# Patient Record
Sex: Female | Born: 1965
Health system: Southern US, Community
[De-identification: ages and names within clinical notes are randomized; demographics above are authoritative.]

## PROBLEM LIST (undated history)

## (undated) VITALS — BP 134/94 | HR 95 | Temp 98.4°F | Resp 20 | Ht 64.0 in | Wt 185.0 lb

## (undated) DIAGNOSIS — E119 Type 2 diabetes mellitus without complications: Secondary | ICD-10-CM

## (undated) DIAGNOSIS — K219 Gastro-esophageal reflux disease without esophagitis: Secondary | ICD-10-CM

## (undated) DIAGNOSIS — I5189 Other ill-defined heart diseases: Secondary | ICD-10-CM

## (undated) DIAGNOSIS — H539 Unspecified visual disturbance: Secondary | ICD-10-CM

## (undated) DIAGNOSIS — F1010 Alcohol abuse, uncomplicated: Secondary | ICD-10-CM

## (undated) DIAGNOSIS — F4310 Post-traumatic stress disorder, unspecified: Secondary | ICD-10-CM

## (undated) DIAGNOSIS — E669 Obesity, unspecified: Secondary | ICD-10-CM

## (undated) DIAGNOSIS — E785 Hyperlipidemia, unspecified: Secondary | ICD-10-CM

## (undated) DIAGNOSIS — R197 Diarrhea, unspecified: Secondary | ICD-10-CM

## (undated) DIAGNOSIS — F32A Depression, unspecified: Secondary | ICD-10-CM

## (undated) DIAGNOSIS — J189 Pneumonia, unspecified organism: Secondary | ICD-10-CM

## (undated) DIAGNOSIS — B079 Viral wart, unspecified: Secondary | ICD-10-CM

## (undated) DIAGNOSIS — D509 Iron deficiency anemia, unspecified: Secondary | ICD-10-CM

## (undated) DIAGNOSIS — J069 Acute upper respiratory infection, unspecified: Secondary | ICD-10-CM

## (undated) DIAGNOSIS — F329 Major depressive disorder, single episode, unspecified: Secondary | ICD-10-CM

## (undated) DIAGNOSIS — I2510 Atherosclerotic heart disease of native coronary artery without angina pectoris: Secondary | ICD-10-CM

## (undated) DIAGNOSIS — F1011 Alcohol abuse, in remission: Secondary | ICD-10-CM

## (undated) DIAGNOSIS — B370 Candidal stomatitis: Secondary | ICD-10-CM

## (undated) DIAGNOSIS — I10 Essential (primary) hypertension: Secondary | ICD-10-CM

## (undated) DIAGNOSIS — E876 Hypokalemia: Secondary | ICD-10-CM

## (undated) DIAGNOSIS — I272 Pulmonary hypertension, unspecified: Secondary | ICD-10-CM

## (undated) DIAGNOSIS — M25562 Pain in left knee: Secondary | ICD-10-CM

## (undated) HISTORY — DX: Atherosclerotic heart disease of native coronary artery without angina pectoris: I25.10

## (undated) HISTORY — DX: Hyperlipidemia, unspecified: E78.5

## (undated) HISTORY — DX: Alcohol abuse, in remission: F10.11

## (undated) HISTORY — DX: Acute upper respiratory infection, unspecified: J06.9

## (undated) HISTORY — DX: Pulmonary hypertension, unspecified: I27.20

## (undated) HISTORY — DX: Gastro-esophageal reflux disease without esophagitis: K21.9

## (undated) HISTORY — DX: Unspecified visual disturbance: H53.9

## (undated) HISTORY — DX: Other ill-defined heart diseases: I51.89

## (undated) HISTORY — DX: Pneumonia, unspecified organism: J18.9

## (undated) HISTORY — DX: Alcohol abuse, uncomplicated: F10.10

## (undated) HISTORY — DX: Depression, unspecified: F32.A

## (undated) HISTORY — DX: Candidal stomatitis: B37.0

## (undated) HISTORY — DX: Obesity, unspecified: E66.9

## (undated) HISTORY — DX: Viral wart, unspecified: B07.9

## (undated) HISTORY — DX: Diarrhea, unspecified: R19.7

## (undated) HISTORY — DX: Iron deficiency anemia, unspecified: D50.9

## (undated) HISTORY — DX: Major depressive disorder, single episode, unspecified: F32.9

## (undated) HISTORY — DX: Essential (primary) hypertension: I10

## (undated) HISTORY — DX: Hypokalemia: E87.6

## (undated) HISTORY — DX: Pain in left knee: M25.562

## (undated) HISTORY — DX: Type 2 diabetes mellitus without complications: E11.9

---

## 1990-04-16 HISTORY — PX: DILATION AND CURETTAGE, DIAGNOSTIC / THERAPEUTIC: SUR384

## 1998-04-16 DIAGNOSIS — E119 Type 2 diabetes mellitus without complications: Secondary | ICD-10-CM

## 1998-04-16 HISTORY — DX: Type 2 diabetes mellitus without complications: E11.9

## 1999-03-01 ENCOUNTER — Inpatient Hospital Stay (HOSPITAL_COMMUNITY): Admission: AD | Admit: 1999-03-01 | Discharge: 1999-03-03 | Payer: Self-pay | Admitting: *Deleted

## 1999-07-05 ENCOUNTER — Inpatient Hospital Stay (HOSPITAL_COMMUNITY): Admission: AD | Admit: 1999-07-05 | Discharge: 1999-07-12 | Payer: Self-pay | Admitting: *Deleted

## 2001-07-08 ENCOUNTER — Emergency Department (HOSPITAL_COMMUNITY): Admission: EM | Admit: 2001-07-08 | Discharge: 2001-07-08 | Payer: Self-pay | Admitting: Emergency Medicine

## 2001-09-21 ENCOUNTER — Emergency Department (HOSPITAL_COMMUNITY): Admission: EM | Admit: 2001-09-21 | Discharge: 2001-09-21 | Payer: Self-pay | Admitting: *Deleted

## 2001-09-30 ENCOUNTER — Ambulatory Visit (HOSPITAL_COMMUNITY): Admission: RE | Admit: 2001-09-30 | Discharge: 2001-09-30 | Payer: Self-pay | Admitting: Unknown Physician Specialty

## 2002-01-20 ENCOUNTER — Emergency Department (HOSPITAL_COMMUNITY): Admission: EM | Admit: 2002-01-20 | Discharge: 2002-01-20 | Payer: Self-pay | Admitting: *Deleted

## 2002-03-12 ENCOUNTER — Emergency Department (HOSPITAL_COMMUNITY): Admission: EM | Admit: 2002-03-12 | Discharge: 2002-03-12 | Payer: Self-pay | Admitting: Emergency Medicine

## 2002-08-17 ENCOUNTER — Emergency Department (HOSPITAL_COMMUNITY): Admission: EM | Admit: 2002-08-17 | Discharge: 2002-08-17 | Payer: Self-pay | Admitting: *Deleted

## 2002-09-28 DIAGNOSIS — E119 Type 2 diabetes mellitus without complications: Secondary | ICD-10-CM

## 2003-07-28 ENCOUNTER — Emergency Department (HOSPITAL_COMMUNITY): Admission: EM | Admit: 2003-07-28 | Discharge: 2003-07-28 | Payer: Self-pay

## 2003-08-16 ENCOUNTER — Emergency Department (HOSPITAL_COMMUNITY): Admission: EM | Admit: 2003-08-16 | Discharge: 2003-08-16 | Payer: Self-pay | Admitting: Emergency Medicine

## 2003-08-28 ENCOUNTER — Emergency Department (HOSPITAL_COMMUNITY): Admission: EM | Admit: 2003-08-28 | Discharge: 2003-08-28 | Payer: Self-pay | Admitting: *Deleted

## 2003-08-29 ENCOUNTER — Emergency Department (HOSPITAL_COMMUNITY): Admission: EM | Admit: 2003-08-29 | Discharge: 2003-08-29 | Payer: Self-pay | Admitting: Emergency Medicine

## 2003-09-04 ENCOUNTER — Emergency Department (HOSPITAL_COMMUNITY): Admission: EM | Admit: 2003-09-04 | Discharge: 2003-09-04 | Payer: Self-pay | Admitting: Emergency Medicine

## 2004-02-16 ENCOUNTER — Ambulatory Visit: Payer: Self-pay | Admitting: Family Medicine

## 2004-03-22 ENCOUNTER — Ambulatory Visit: Payer: Self-pay | Admitting: Family Medicine

## 2004-05-05 ENCOUNTER — Emergency Department (HOSPITAL_COMMUNITY): Admission: EM | Admit: 2004-05-05 | Discharge: 2004-05-06 | Payer: Self-pay | Admitting: *Deleted

## 2004-05-12 ENCOUNTER — Ambulatory Visit: Payer: Self-pay | Admitting: Family Medicine

## 2004-05-19 ENCOUNTER — Ambulatory Visit: Payer: Self-pay | Admitting: Family Medicine

## 2004-06-21 ENCOUNTER — Ambulatory Visit: Payer: Self-pay | Admitting: Family Medicine

## 2004-09-08 ENCOUNTER — Emergency Department (HOSPITAL_COMMUNITY): Admission: EM | Admit: 2004-09-08 | Discharge: 2004-09-09 | Payer: Self-pay | Admitting: Emergency Medicine

## 2005-04-02 ENCOUNTER — Emergency Department (HOSPITAL_COMMUNITY): Admission: EM | Admit: 2005-04-02 | Discharge: 2005-04-03 | Payer: Self-pay | Admitting: Emergency Medicine

## 2005-04-27 ENCOUNTER — Emergency Department (HOSPITAL_COMMUNITY): Admission: EM | Admit: 2005-04-27 | Discharge: 2005-04-28 | Payer: Self-pay | Admitting: Emergency Medicine

## 2005-09-18 ENCOUNTER — Inpatient Hospital Stay (HOSPITAL_COMMUNITY): Admission: EM | Admit: 2005-09-18 | Discharge: 2005-09-20 | Payer: Self-pay | Admitting: Emergency Medicine

## 2005-09-19 ENCOUNTER — Ambulatory Visit: Payer: Self-pay | Admitting: *Deleted

## 2005-09-19 ENCOUNTER — Ambulatory Visit: Payer: Self-pay | Admitting: Cardiology

## 2005-09-27 ENCOUNTER — Ambulatory Visit: Payer: Self-pay | Admitting: Family Medicine

## 2005-09-28 ENCOUNTER — Ambulatory Visit: Payer: Self-pay | Admitting: *Deleted

## 2005-10-02 ENCOUNTER — Ambulatory Visit (HOSPITAL_COMMUNITY): Admission: RE | Admit: 2005-10-02 | Discharge: 2005-10-02 | Payer: Self-pay | Admitting: Cardiology

## 2005-11-05 ENCOUNTER — Ambulatory Visit: Payer: Self-pay | Admitting: Family Medicine

## 2005-11-05 ENCOUNTER — Encounter (INDEPENDENT_AMBULATORY_CARE_PROVIDER_SITE_OTHER): Payer: Self-pay | Admitting: *Deleted

## 2005-11-05 ENCOUNTER — Other Ambulatory Visit: Admission: RE | Admit: 2005-11-05 | Discharge: 2005-11-05 | Payer: Self-pay | Admitting: Family Medicine

## 2005-11-05 ENCOUNTER — Encounter: Payer: Self-pay | Admitting: Family Medicine

## 2005-11-05 LAB — CONVERTED CEMR LAB: Pap Smear: NORMAL

## 2005-11-15 ENCOUNTER — Emergency Department (HOSPITAL_COMMUNITY): Admission: EM | Admit: 2005-11-15 | Discharge: 2005-11-16 | Payer: Self-pay | Admitting: Emergency Medicine

## 2005-11-19 ENCOUNTER — Ambulatory Visit: Payer: Self-pay | Admitting: Family Medicine

## 2005-11-30 ENCOUNTER — Emergency Department (HOSPITAL_COMMUNITY): Admission: EM | Admit: 2005-11-30 | Discharge: 2005-11-30 | Payer: Self-pay | Admitting: Emergency Medicine

## 2006-01-07 ENCOUNTER — Ambulatory Visit: Payer: Self-pay | Admitting: Family Medicine

## 2006-01-14 HISTORY — PX: COLONOSCOPY: SHX174

## 2006-01-22 ENCOUNTER — Ambulatory Visit: Payer: Self-pay | Admitting: Family Medicine

## 2006-01-24 ENCOUNTER — Ambulatory Visit: Payer: Self-pay | Admitting: Internal Medicine

## 2006-02-05 ENCOUNTER — Ambulatory Visit (HOSPITAL_COMMUNITY): Admission: RE | Admit: 2006-02-05 | Discharge: 2006-02-05 | Payer: Self-pay | Admitting: Internal Medicine

## 2006-02-05 ENCOUNTER — Ambulatory Visit: Payer: Self-pay | Admitting: Internal Medicine

## 2006-02-11 ENCOUNTER — Ambulatory Visit: Payer: Self-pay | Admitting: Family Medicine

## 2006-02-22 ENCOUNTER — Ambulatory Visit: Payer: Self-pay | Admitting: Family Medicine

## 2006-03-06 ENCOUNTER — Ambulatory Visit: Payer: Self-pay | Admitting: Family Medicine

## 2006-04-29 ENCOUNTER — Ambulatory Visit (HOSPITAL_COMMUNITY): Admission: RE | Admit: 2006-04-29 | Discharge: 2006-04-29 | Payer: Self-pay | Admitting: Family Medicine

## 2006-04-29 ENCOUNTER — Ambulatory Visit: Payer: Self-pay | Admitting: Family Medicine

## 2006-04-29 LAB — CONVERTED CEMR LAB: TSH: 1.869 microintl units/mL (ref 0.350–5.50)

## 2006-04-30 ENCOUNTER — Ambulatory Visit (HOSPITAL_COMMUNITY): Admission: RE | Admit: 2006-04-30 | Discharge: 2006-04-30 | Payer: Self-pay | Admitting: Family Medicine

## 2006-04-30 ENCOUNTER — Encounter: Payer: Self-pay | Admitting: Family Medicine

## 2006-06-07 ENCOUNTER — Encounter: Payer: Self-pay | Admitting: Family Medicine

## 2006-06-07 LAB — CONVERTED CEMR LAB
Calcium: 9.3 mg/dL (ref 8.4–10.5)
Hgb A1c MFr Bld: 7.4 % — ABNORMAL HIGH (ref 4.6–6.1)
Sodium: 139 meq/L (ref 135–145)

## 2006-06-13 ENCOUNTER — Ambulatory Visit: Payer: Self-pay | Admitting: Family Medicine

## 2006-06-14 ENCOUNTER — Encounter: Payer: Self-pay | Admitting: Family Medicine

## 2006-08-26 ENCOUNTER — Ambulatory Visit: Payer: Self-pay | Admitting: Family Medicine

## 2006-10-08 ENCOUNTER — Encounter: Payer: Self-pay | Admitting: Family Medicine

## 2006-10-08 LAB — CONVERTED CEMR LAB
ALT: 8 U/L
AST: 11 U/L
Albumin: 4.1 g/dL
Alkaline Phosphatase: 52 U/L
BUN: 11 mg/dL
Basophils Absolute: 0 10*3/uL
Basophils Relative: 0 %
Bilirubin, Direct: <0.1 mg/dL
CO2: 24 meq/L
Calcium: 9.5 mg/dL
Chloride: 104 meq/L
Cholesterol: 133 mg/dL
Creatinine, Ser: 0.82 mg/dL
Eosinophils Absolute: 0.1 10*3/uL
Eosinophils Relative: 2 %
Glucose, Bld: 88 mg/dL
HCT: 42.1 %
HDL: 43 mg/dL
Hemoglobin: 13.7 g/dL
Hgb A1c MFr Bld: 7.3 % — ABNORMAL HIGH
LDL Cholesterol: 71 mg/dL
Lymphocytes Relative: 30 %
Lymphs Abs: 2.2 10*3/uL
MCHC: 32.5 g/dL
MCV: 87.5 fL
Monocytes Absolute: 0.4 10*3/uL
Monocytes Relative: 5 %
Neutro Abs: 4.6 10*3/uL
Neutrophils Relative %: 63 %
Platelets: 200 10*3/uL
Potassium: 4.3 meq/L
RBC: 4.81 M/uL
RDW: 15.6 % — ABNORMAL HIGH
Sodium: 140 meq/L
Total Bilirubin: 0.2 mg/dL — ABNORMAL LOW
Total CHOL/HDL Ratio: 3.1
Total Protein: 6.6 g/dL
Triglycerides: 94 mg/dL
VLDL: 19 mg/dL
WBC: 7.3 10*3/uL

## 2006-11-07 ENCOUNTER — Ambulatory Visit: Payer: Self-pay | Admitting: Family Medicine

## 2006-11-07 ENCOUNTER — Other Ambulatory Visit: Admission: RE | Admit: 2006-11-07 | Discharge: 2006-11-07 | Payer: Self-pay | Admitting: Family Medicine

## 2006-11-07 ENCOUNTER — Encounter (INDEPENDENT_AMBULATORY_CARE_PROVIDER_SITE_OTHER): Payer: Self-pay | Admitting: *Deleted

## 2006-11-07 ENCOUNTER — Encounter: Payer: Self-pay | Admitting: Family Medicine

## 2006-11-07 LAB — CONVERTED CEMR LAB
Pap Smear: NORMAL
Pap Smear: NORMAL

## 2006-12-19 ENCOUNTER — Ambulatory Visit: Payer: Self-pay | Admitting: Family Medicine

## 2007-01-24 ENCOUNTER — Ambulatory Visit: Payer: Self-pay | Admitting: Family Medicine

## 2007-03-29 ENCOUNTER — Encounter (INDEPENDENT_AMBULATORY_CARE_PROVIDER_SITE_OTHER): Payer: Self-pay | Admitting: *Deleted

## 2007-04-21 ENCOUNTER — Emergency Department (HOSPITAL_COMMUNITY): Admission: EM | Admit: 2007-04-21 | Discharge: 2007-04-22 | Payer: Self-pay | Admitting: Emergency Medicine

## 2007-04-23 ENCOUNTER — Ambulatory Visit: Payer: Self-pay | Admitting: Family Medicine

## 2007-04-25 ENCOUNTER — Ambulatory Visit: Payer: Self-pay | Admitting: Family Medicine

## 2007-04-26 ENCOUNTER — Encounter: Payer: Self-pay | Admitting: Family Medicine

## 2007-04-26 LAB — CONVERTED CEMR LAB
Leukocytes, UA: NEGATIVE
Nitrite: NEGATIVE
Specific Gravity, Urine: 1.031 — ABNORMAL HIGH (ref 1.005–1.03)
Triglycerides: 92 mg/dL (ref <150)
Urobilinogen, UA: 0.2 (ref 0.0–1.0)

## 2007-04-28 ENCOUNTER — Ambulatory Visit: Payer: Self-pay | Admitting: Cardiology

## 2007-07-16 ENCOUNTER — Ambulatory Visit: Payer: Self-pay | Admitting: Family Medicine

## 2007-07-17 ENCOUNTER — Emergency Department (HOSPITAL_COMMUNITY): Admission: EM | Admit: 2007-07-17 | Discharge: 2007-07-17 | Payer: Self-pay | Admitting: Emergency Medicine

## 2007-07-17 ENCOUNTER — Encounter: Payer: Self-pay | Admitting: Family Medicine

## 2007-07-17 LAB — CONVERTED CEMR LAB
ALT: 10 units/L (ref 0–35)
BUN: 13 mg/dL (ref 6–23)
Bilirubin, Direct: 0.1 mg/dL (ref 0.0–0.3)
Calcium: 9.5 mg/dL (ref 8.4–10.5)
Cholesterol: 151 mg/dL (ref 0–200)
Creatinine, Ser: 0.88 mg/dL (ref 0.40–1.20)
Glucose, Bld: 167 mg/dL — ABNORMAL HIGH (ref 70–99)
Indirect Bilirubin: 0.2 mg/dL (ref 0.0–0.9)
Potassium: 4.5 meq/L (ref 3.5–5.3)
VLDL: 22 mg/dL (ref 0–40)

## 2007-07-22 DIAGNOSIS — F310 Bipolar disorder, current episode hypomanic: Secondary | ICD-10-CM | POA: Insufficient documentation

## 2007-07-22 DIAGNOSIS — E669 Obesity, unspecified: Secondary | ICD-10-CM

## 2007-07-22 DIAGNOSIS — E785 Hyperlipidemia, unspecified: Secondary | ICD-10-CM

## 2007-07-22 DIAGNOSIS — I10 Essential (primary) hypertension: Secondary | ICD-10-CM

## 2007-07-22 DIAGNOSIS — F1011 Alcohol abuse, in remission: Secondary | ICD-10-CM

## 2007-07-22 DIAGNOSIS — F319 Bipolar disorder, unspecified: Secondary | ICD-10-CM | POA: Insufficient documentation

## 2007-07-22 HISTORY — DX: Alcohol abuse, in remission: F10.11

## 2007-07-30 ENCOUNTER — Ambulatory Visit: Payer: Self-pay | Admitting: Family Medicine

## 2007-08-06 ENCOUNTER — Ambulatory Visit (HOSPITAL_COMMUNITY): Admission: RE | Admit: 2007-08-06 | Discharge: 2007-08-06 | Payer: Self-pay | Admitting: Family Medicine

## 2007-08-06 ENCOUNTER — Encounter (INDEPENDENT_AMBULATORY_CARE_PROVIDER_SITE_OTHER): Payer: Self-pay | Admitting: *Deleted

## 2007-08-07 ENCOUNTER — Ambulatory Visit (HOSPITAL_COMMUNITY): Admission: RE | Admit: 2007-08-07 | Discharge: 2007-08-07 | Payer: Self-pay | Admitting: Family Medicine

## 2007-08-08 ENCOUNTER — Ambulatory Visit: Payer: Self-pay | Admitting: Family Medicine

## 2007-08-12 ENCOUNTER — Encounter (HOSPITAL_COMMUNITY): Admission: RE | Admit: 2007-08-12 | Discharge: 2007-09-11 | Payer: Self-pay | Admitting: Family Medicine

## 2007-09-15 HISTORY — PX: ESOPHAGOGASTRODUODENOSCOPY: SHX1529

## 2007-09-16 ENCOUNTER — Ambulatory Visit: Payer: Self-pay | Admitting: Internal Medicine

## 2007-09-17 ENCOUNTER — Telehealth: Payer: Self-pay | Admitting: Family Medicine

## 2007-09-19 ENCOUNTER — Ambulatory Visit (HOSPITAL_COMMUNITY): Admission: RE | Admit: 2007-09-19 | Discharge: 2007-09-19 | Payer: Self-pay | Admitting: Gastroenterology

## 2007-09-29 ENCOUNTER — Encounter: Payer: Self-pay | Admitting: Internal Medicine

## 2007-09-29 ENCOUNTER — Ambulatory Visit (HOSPITAL_COMMUNITY): Admission: RE | Admit: 2007-09-29 | Discharge: 2007-09-29 | Payer: Self-pay | Admitting: Internal Medicine

## 2007-09-29 ENCOUNTER — Ambulatory Visit: Payer: Self-pay | Admitting: Internal Medicine

## 2007-10-08 ENCOUNTER — Encounter: Payer: Self-pay | Admitting: Family Medicine

## 2007-10-08 ENCOUNTER — Ambulatory Visit: Payer: Self-pay | Admitting: Family Medicine

## 2007-10-08 LAB — CONVERTED CEMR LAB: Blood Glucose, Fasting: 173 mg/dL

## 2007-11-03 ENCOUNTER — Ambulatory Visit: Payer: Self-pay | Admitting: Internal Medicine

## 2007-11-10 ENCOUNTER — Other Ambulatory Visit: Admission: RE | Admit: 2007-11-10 | Discharge: 2007-11-10 | Payer: Self-pay | Admitting: Family Medicine

## 2007-11-10 ENCOUNTER — Ambulatory Visit: Payer: Self-pay | Admitting: Family Medicine

## 2007-11-10 ENCOUNTER — Encounter: Payer: Self-pay | Admitting: Family Medicine

## 2007-11-10 LAB — CONVERTED CEMR LAB
Bilirubin Urine: NEGATIVE
Protein, U semiquant: 30
Specific Gravity, Urine: 1.025

## 2007-11-11 ENCOUNTER — Ambulatory Visit: Payer: Self-pay | Admitting: Family Medicine

## 2007-11-11 LAB — CONVERTED CEMR LAB
Gardnerella vaginalis: NEGATIVE
Trichomonal Vaginitis: NEGATIVE

## 2007-11-12 ENCOUNTER — Ambulatory Visit (HOSPITAL_COMMUNITY): Admission: RE | Admit: 2007-11-12 | Discharge: 2007-11-12 | Payer: Self-pay | Admitting: Family Medicine

## 2007-11-14 ENCOUNTER — Encounter: Payer: Self-pay | Admitting: Family Medicine

## 2007-11-18 ENCOUNTER — Encounter: Payer: Self-pay | Admitting: Family Medicine

## 2007-11-20 ENCOUNTER — Telehealth: Payer: Self-pay | Admitting: Family Medicine

## 2007-12-11 ENCOUNTER — Telehealth: Payer: Self-pay | Admitting: Family Medicine

## 2007-12-29 ENCOUNTER — Telehealth: Payer: Self-pay | Admitting: Family Medicine

## 2007-12-30 ENCOUNTER — Telehealth: Payer: Self-pay | Admitting: Family Medicine

## 2008-02-04 ENCOUNTER — Ambulatory Visit: Payer: Self-pay | Admitting: Family Medicine

## 2008-02-13 ENCOUNTER — Ambulatory Visit (HOSPITAL_COMMUNITY): Admission: RE | Admit: 2008-02-13 | Discharge: 2008-02-13 | Payer: Self-pay | Admitting: Family Medicine

## 2008-02-18 ENCOUNTER — Encounter: Payer: Self-pay | Admitting: Family Medicine

## 2008-02-19 LAB — CONVERTED CEMR LAB
AST: 11 units/L (ref 0–37)
Albumin: 4.2 g/dL (ref 3.5–5.2)
Bilirubin, Direct: <0.1 mg/dL (ref 0.0–0.3)
CO2: 22 meq/L (ref 19–32)
Calcium: 9.6 mg/dL (ref 8.4–10.5)
Chloride: 101 meq/L (ref 96–112)
Glucose, Bld: 160 mg/dL — ABNORMAL HIGH (ref 70–99)
LDL Cholesterol: 97 mg/dL (ref 0–99)
Sodium: 136 meq/L (ref 135–145)
Total Bilirubin: 0.3 mg/dL (ref 0.3–1.2)
Total CHOL/HDL Ratio: 4.6

## 2008-02-20 ENCOUNTER — Encounter: Payer: Self-pay | Admitting: Family Medicine

## 2008-02-23 ENCOUNTER — Ambulatory Visit (HOSPITAL_COMMUNITY): Admission: RE | Admit: 2008-02-23 | Discharge: 2008-02-23 | Payer: Self-pay | Admitting: Family Medicine

## 2008-03-15 ENCOUNTER — Ambulatory Visit: Payer: Self-pay | Admitting: Cardiology

## 2008-03-15 ENCOUNTER — Ambulatory Visit: Payer: Self-pay | Admitting: Family Medicine

## 2008-03-15 DIAGNOSIS — N949 Unspecified condition associated with female genital organs and menstrual cycle: Secondary | ICD-10-CM

## 2008-03-25 ENCOUNTER — Ambulatory Visit: Payer: Self-pay | Admitting: Family Medicine

## 2008-03-25 LAB — CONVERTED CEMR LAB
Beta hcg, urine, semiquantitative: NEGATIVE
Glucose, Bld: 196 mg/dL

## 2008-04-13 ENCOUNTER — Telehealth: Payer: Self-pay | Admitting: Family Medicine

## 2008-04-22 ENCOUNTER — Ambulatory Visit: Payer: Self-pay | Admitting: Family Medicine

## 2008-05-05 ENCOUNTER — Telehealth: Payer: Self-pay | Admitting: Family Medicine

## 2008-05-13 ENCOUNTER — Ambulatory Visit: Payer: Self-pay | Admitting: Family Medicine

## 2008-05-13 LAB — CONVERTED CEMR LAB
Glucose, Bld: 275 mg/dL
Hgb A1c MFr Bld: 7.5 %

## 2008-05-20 ENCOUNTER — Emergency Department (HOSPITAL_COMMUNITY): Admission: EM | Admit: 2008-05-20 | Discharge: 2008-05-20 | Payer: Self-pay | Admitting: Emergency Medicine

## 2008-05-24 ENCOUNTER — Ambulatory Visit: Payer: Self-pay | Admitting: Family Medicine

## 2008-05-24 LAB — CONVERTED CEMR LAB: Glucose, Bld: 202 mg/dL

## 2008-05-30 ENCOUNTER — Emergency Department (HOSPITAL_COMMUNITY): Admission: EM | Admit: 2008-05-30 | Discharge: 2008-05-31 | Payer: Self-pay | Admitting: Emergency Medicine

## 2008-05-31 ENCOUNTER — Ambulatory Visit: Payer: Self-pay | Admitting: Family Medicine

## 2008-05-31 ENCOUNTER — Telehealth (INDEPENDENT_AMBULATORY_CARE_PROVIDER_SITE_OTHER): Payer: Self-pay | Admitting: Family Medicine

## 2008-06-01 ENCOUNTER — Telehealth: Payer: Self-pay | Admitting: Family Medicine

## 2008-06-02 ENCOUNTER — Telehealth: Payer: Self-pay | Admitting: Family Medicine

## 2008-06-03 ENCOUNTER — Ambulatory Visit: Payer: Self-pay | Admitting: Cardiology

## 2008-06-03 ENCOUNTER — Telehealth: Payer: Self-pay | Admitting: Family Medicine

## 2008-06-09 ENCOUNTER — Ambulatory Visit: Payer: Self-pay | Admitting: Cardiology

## 2008-06-09 ENCOUNTER — Ambulatory Visit (HOSPITAL_COMMUNITY): Admission: RE | Admit: 2008-06-09 | Discharge: 2008-06-09 | Payer: Self-pay | Admitting: Cardiology

## 2008-06-09 ENCOUNTER — Encounter (HOSPITAL_COMMUNITY): Admission: RE | Admit: 2008-06-09 | Discharge: 2008-06-09 | Payer: Self-pay | Admitting: Family Medicine

## 2008-06-09 ENCOUNTER — Encounter: Payer: Self-pay | Admitting: Cardiology

## 2008-06-10 ENCOUNTER — Ambulatory Visit (HOSPITAL_COMMUNITY): Payer: Self-pay | Admitting: Psychiatry

## 2008-06-23 ENCOUNTER — Telehealth: Payer: Self-pay | Admitting: Family Medicine

## 2008-07-06 ENCOUNTER — Telehealth: Payer: Self-pay | Admitting: Family Medicine

## 2008-07-06 ENCOUNTER — Ambulatory Visit (HOSPITAL_COMMUNITY): Payer: Self-pay | Admitting: Psychiatry

## 2008-07-08 ENCOUNTER — Ambulatory Visit: Payer: Self-pay | Admitting: Cardiology

## 2008-07-12 ENCOUNTER — Ambulatory Visit: Payer: Self-pay | Admitting: Family Medicine

## 2008-07-19 ENCOUNTER — Telehealth: Payer: Self-pay | Admitting: Family Medicine

## 2008-07-27 ENCOUNTER — Ambulatory Visit (HOSPITAL_COMMUNITY): Payer: Self-pay | Admitting: Psychiatry

## 2008-08-10 ENCOUNTER — Ambulatory Visit (HOSPITAL_COMMUNITY): Payer: Self-pay | Admitting: Psychiatry

## 2008-08-18 ENCOUNTER — Encounter: Payer: Self-pay | Admitting: Family Medicine

## 2008-08-22 ENCOUNTER — Telehealth (INDEPENDENT_AMBULATORY_CARE_PROVIDER_SITE_OTHER): Payer: Self-pay | Admitting: Internal Medicine

## 2008-08-23 ENCOUNTER — Telehealth: Payer: Self-pay | Admitting: Family Medicine

## 2008-08-24 ENCOUNTER — Encounter (INDEPENDENT_AMBULATORY_CARE_PROVIDER_SITE_OTHER): Payer: Self-pay | Admitting: *Deleted

## 2008-08-25 ENCOUNTER — Ambulatory Visit (HOSPITAL_COMMUNITY): Payer: Self-pay | Admitting: Psychiatry

## 2008-08-31 ENCOUNTER — Ambulatory Visit: Payer: Self-pay | Admitting: Family Medicine

## 2008-08-31 DIAGNOSIS — Z8719 Personal history of other diseases of the digestive system: Secondary | ICD-10-CM | POA: Insufficient documentation

## 2008-08-31 LAB — CONVERTED CEMR LAB: Blood Glucose, Fasting: 225 mg/dL

## 2008-09-01 LAB — CONVERTED CEMR LAB
Albumin: 3.8 g/dL (ref 3.5–5.2)
Alkaline Phosphatase: 66 units/L (ref 39–117)
BUN: 11 mg/dL (ref 6–23)
CO2: 21 meq/L (ref 19–32)
Chloride: 104 meq/L (ref 96–112)
Creatinine, Ser: 1.02 mg/dL (ref 0.40–1.20)
Eosinophils Absolute: 0.4 10*3/uL (ref 0.0–0.7)
Eosinophils Relative: 4 % (ref 0–5)
HCT: 37.9 % (ref 36.0–46.0)
Hemoglobin: 12.6 g/dL (ref 12.0–15.0)
LDL Cholesterol: 42 mg/dL (ref 0–99)
Lymphocytes Relative: 29 % (ref 12–46)
Lymphs Abs: 2.6 10*3/uL (ref 0.7–4.0)
MCV: 83.5 fL (ref 78.0–100.0)
Microalb Creat Ratio: 29 mg/g (ref 0.0–30.0)
Monocytes Absolute: 0.4 10*3/uL (ref 0.1–1.0)
Monocytes Relative: 5 % (ref 3–12)
Potassium: 4.4 meq/L (ref 3.5–5.3)
RBC: 4.54 M/uL (ref 3.87–5.11)
Total Bilirubin: 0.3 mg/dL (ref 0.3–1.2)
Total Protein: 6.5 g/dL (ref 6.0–8.3)
Triglycerides: 309 mg/dL — ABNORMAL HIGH (ref <150)
VLDL: 62 mg/dL — ABNORMAL HIGH (ref 0–40)
WBC: 9.1 10*3/uL (ref 4.0–10.5)

## 2008-09-07 ENCOUNTER — Ambulatory Visit (HOSPITAL_COMMUNITY): Payer: Self-pay | Admitting: Psychiatry

## 2008-09-07 ENCOUNTER — Emergency Department (HOSPITAL_COMMUNITY): Admission: EM | Admit: 2008-09-07 | Discharge: 2008-09-07 | Payer: Self-pay | Admitting: Emergency Medicine

## 2008-09-10 ENCOUNTER — Encounter: Payer: Self-pay | Admitting: Family Medicine

## 2008-09-14 ENCOUNTER — Ambulatory Visit: Payer: Self-pay | Admitting: Family Medicine

## 2008-09-14 ENCOUNTER — Telehealth (INDEPENDENT_AMBULATORY_CARE_PROVIDER_SITE_OTHER): Payer: Self-pay | Admitting: *Deleted

## 2008-09-15 ENCOUNTER — Emergency Department (HOSPITAL_COMMUNITY): Admission: EM | Admit: 2008-09-15 | Discharge: 2008-09-16 | Payer: Self-pay | Admitting: Emergency Medicine

## 2008-09-15 ENCOUNTER — Ambulatory Visit: Payer: Self-pay | Admitting: Internal Medicine

## 2008-09-16 ENCOUNTER — Encounter: Payer: Self-pay | Admitting: Family Medicine

## 2008-09-16 ENCOUNTER — Encounter: Payer: Self-pay | Admitting: Internal Medicine

## 2008-09-16 ENCOUNTER — Ambulatory Visit (HOSPITAL_COMMUNITY): Admission: RE | Admit: 2008-09-16 | Discharge: 2008-09-16 | Payer: Self-pay | Admitting: Internal Medicine

## 2008-09-16 ENCOUNTER — Ambulatory Visit: Payer: Self-pay | Admitting: Internal Medicine

## 2008-09-16 DIAGNOSIS — K921 Melena: Secondary | ICD-10-CM | POA: Insufficient documentation

## 2008-09-16 HISTORY — PX: ESOPHAGOGASTRODUODENOSCOPY: SHX1529

## 2008-09-17 LAB — CONVERTED CEMR LAB
ALT: 27 units/L (ref 0–35)
AST: 37 units/L (ref 0–37)
Albumin: 3.1 g/dL — ABNORMAL LOW (ref 3.5–5.2)
CO2: 23 meq/L (ref 19–32)
Calcium: 9 mg/dL (ref 8.4–10.5)
Chloride: 98 meq/L (ref 96–112)
Lipase: 28 units/L (ref 11–59)
Lymphocytes Relative: 31 % (ref 12–46)
Lymphs Abs: 2.8 10*3/uL (ref 0.7–4.0)
MCV: 87.5 fL (ref 78.0–100.0)
Monocytes Relative: 4 % (ref 3–12)
Neutrophils Relative %: 63 % (ref 43–77)
Platelets: 198 10*3/uL (ref 150–400)
Potassium: 3.7 meq/L (ref 3.5–5.3)
RBC: 4.08 M/uL (ref 3.87–5.11)
Sodium: 132 meq/L — ABNORMAL LOW (ref 135–145)
Total Protein: 5.8 g/dL — ABNORMAL LOW (ref 6.0–8.3)
WBC: 9.1 10*3/uL (ref 4.0–10.5)

## 2008-09-21 ENCOUNTER — Encounter: Payer: Self-pay | Admitting: Internal Medicine

## 2008-09-27 ENCOUNTER — Encounter: Payer: Self-pay | Admitting: Family Medicine

## 2008-09-29 ENCOUNTER — Emergency Department (HOSPITAL_COMMUNITY): Admission: EM | Admit: 2008-09-29 | Discharge: 2008-09-29 | Payer: Self-pay | Admitting: Emergency Medicine

## 2008-09-30 ENCOUNTER — Encounter (INDEPENDENT_AMBULATORY_CARE_PROVIDER_SITE_OTHER): Payer: Self-pay | Admitting: *Deleted

## 2008-10-04 ENCOUNTER — Ambulatory Visit: Payer: Self-pay | Admitting: Family Medicine

## 2008-10-05 LAB — CONVERTED CEMR LAB
BUN: 9 mg/dL (ref 6–23)
Calcium: 9.8 mg/dL (ref 8.4–10.5)
Creatinine, Ser: 1 mg/dL (ref 0.40–1.20)
Glucose, Bld: 298 mg/dL — ABNORMAL HIGH (ref 70–99)
Potassium: 4.5 meq/L (ref 3.5–5.3)

## 2008-10-06 ENCOUNTER — Emergency Department (HOSPITAL_COMMUNITY): Admission: EM | Admit: 2008-10-06 | Discharge: 2008-10-06 | Payer: Self-pay | Admitting: Emergency Medicine

## 2008-10-07 ENCOUNTER — Ambulatory Visit (HOSPITAL_COMMUNITY): Payer: Self-pay | Admitting: Psychiatry

## 2008-10-08 ENCOUNTER — Emergency Department (HOSPITAL_COMMUNITY): Admission: EM | Admit: 2008-10-08 | Discharge: 2008-10-09 | Payer: Self-pay | Admitting: Emergency Medicine

## 2008-10-12 ENCOUNTER — Ambulatory Visit: Payer: Self-pay | Admitting: *Deleted

## 2008-10-12 ENCOUNTER — Inpatient Hospital Stay (HOSPITAL_COMMUNITY): Admission: RE | Admit: 2008-10-12 | Discharge: 2008-10-14 | Payer: Self-pay | Admitting: *Deleted

## 2008-10-20 ENCOUNTER — Telehealth (INDEPENDENT_AMBULATORY_CARE_PROVIDER_SITE_OTHER): Payer: Self-pay

## 2008-10-24 ENCOUNTER — Emergency Department (HOSPITAL_COMMUNITY): Admission: EM | Admit: 2008-10-24 | Discharge: 2008-10-24 | Payer: Self-pay | Admitting: Emergency Medicine

## 2008-11-02 ENCOUNTER — Ambulatory Visit: Payer: Self-pay | Admitting: Family Medicine

## 2008-11-02 DIAGNOSIS — E876 Hypokalemia: Secondary | ICD-10-CM

## 2008-11-02 LAB — CONVERTED CEMR LAB: Blood Glucose, Fasting: 85 mg/dL

## 2008-11-15 ENCOUNTER — Encounter: Payer: Self-pay | Admitting: Family Medicine

## 2008-11-15 ENCOUNTER — Encounter: Payer: Self-pay | Admitting: Gastroenterology

## 2008-11-16 ENCOUNTER — Encounter: Payer: Self-pay | Admitting: Family Medicine

## 2008-11-18 ENCOUNTER — Ambulatory Visit: Payer: Self-pay | Admitting: Internal Medicine

## 2008-11-24 ENCOUNTER — Encounter: Payer: Self-pay | Admitting: Family Medicine

## 2008-11-29 ENCOUNTER — Emergency Department (HOSPITAL_COMMUNITY): Admission: EM | Admit: 2008-11-29 | Discharge: 2008-11-29 | Payer: Self-pay | Admitting: Emergency Medicine

## 2008-12-08 ENCOUNTER — Encounter: Payer: Self-pay | Admitting: Family Medicine

## 2008-12-09 ENCOUNTER — Ambulatory Visit (HOSPITAL_COMMUNITY): Payer: Self-pay | Admitting: Psychiatry

## 2008-12-09 ENCOUNTER — Encounter: Payer: Self-pay | Admitting: Cardiology

## 2008-12-09 LAB — CONVERTED CEMR LAB
AST: 16 units/L
Alkaline Phosphatase: 63 units/L
Bilirubin, Direct: 0.2 mg/dL
Glucose, Bld: 264 mg/dL
HCT: 38.6 %
Hemoglobin: 12.5 g/dL
MCV: 93.2 fL
Sodium: 139 meq/L
Total Protein: 6.3 g/dL

## 2008-12-10 ENCOUNTER — Encounter: Payer: Self-pay | Admitting: Adult Health

## 2008-12-10 ENCOUNTER — Observation Stay (HOSPITAL_COMMUNITY): Admission: EM | Admit: 2008-12-10 | Discharge: 2008-12-10 | Payer: Self-pay | Admitting: Emergency Medicine

## 2008-12-10 LAB — CONVERTED CEMR LAB
CK-MB: <1 ng/mL
GFR calc non Af Amer: >60 mL/min
Glomerular Filtration Rate, Af Am: >60 mL/min/{1.73_m2}
HCT: 36.1 %
Hemoglobin: 3.88 g/dL
Platelets: 130 10*3/uL
Sodium: 138 meq/L
Troponin I: <0.05 ng/mL

## 2008-12-11 ENCOUNTER — Encounter: Payer: Self-pay | Admitting: Adult Health

## 2008-12-11 ENCOUNTER — Emergency Department (HOSPITAL_COMMUNITY): Admission: EM | Admit: 2008-12-11 | Discharge: 2008-12-12 | Payer: Self-pay | Admitting: Emergency Medicine

## 2008-12-11 LAB — CONVERTED CEMR LAB
ALT: 14 units/L
BUN: 9 mg/dL
CK-MB: <1 ng/mL
CO2: 27 meq/L
Calcium: 9.3 mg/dL
Chloride: 104 meq/L
Glucose, Bld: 150 mg/dL
Potassium: 3.9 meq/L
Total Protein: 6.3 g/dL

## 2008-12-13 ENCOUNTER — Telehealth: Payer: Self-pay | Admitting: Family Medicine

## 2008-12-13 ENCOUNTER — Ambulatory Visit: Payer: Self-pay | Admitting: Family Medicine

## 2008-12-13 DIAGNOSIS — M549 Dorsalgia, unspecified: Secondary | ICD-10-CM

## 2008-12-13 LAB — CONVERTED CEMR LAB: Glucose, Bld: 205 mg/dL

## 2008-12-21 ENCOUNTER — Telehealth: Payer: Self-pay | Admitting: Family Medicine

## 2008-12-22 ENCOUNTER — Telehealth (INDEPENDENT_AMBULATORY_CARE_PROVIDER_SITE_OTHER): Payer: Self-pay | Admitting: *Deleted

## 2008-12-22 ENCOUNTER — Telehealth: Payer: Self-pay | Admitting: Family Medicine

## 2008-12-22 ENCOUNTER — Ambulatory Visit: Payer: Self-pay | Admitting: Family Medicine

## 2008-12-23 DIAGNOSIS — N750 Cyst of Bartholin's gland: Secondary | ICD-10-CM

## 2009-01-04 ENCOUNTER — Encounter: Payer: Self-pay | Admitting: Cardiology

## 2009-01-04 ENCOUNTER — Encounter: Payer: Self-pay | Admitting: Adult Health

## 2009-01-13 ENCOUNTER — Telehealth: Payer: Self-pay | Admitting: Family Medicine

## 2009-01-20 ENCOUNTER — Ambulatory Visit (HOSPITAL_COMMUNITY): Payer: Self-pay | Admitting: Psychiatry

## 2009-01-24 ENCOUNTER — Telehealth: Payer: Self-pay | Admitting: Family Medicine

## 2009-01-27 ENCOUNTER — Encounter: Payer: Self-pay | Admitting: Family Medicine

## 2009-02-10 ENCOUNTER — Telehealth: Payer: Self-pay | Admitting: Family Medicine

## 2009-02-15 ENCOUNTER — Ambulatory Visit (HOSPITAL_COMMUNITY): Admission: RE | Admit: 2009-02-15 | Discharge: 2009-02-15 | Payer: Self-pay | Admitting: Family Medicine

## 2009-02-18 ENCOUNTER — Telehealth: Payer: Self-pay | Admitting: Family Medicine

## 2009-02-22 ENCOUNTER — Ambulatory Visit: Payer: Self-pay | Admitting: Family Medicine

## 2009-02-22 DIAGNOSIS — R5383 Other fatigue: Secondary | ICD-10-CM

## 2009-02-22 DIAGNOSIS — R5381 Other malaise: Secondary | ICD-10-CM | POA: Insufficient documentation

## 2009-02-23 ENCOUNTER — Encounter: Payer: Self-pay | Admitting: Family Medicine

## 2009-02-25 ENCOUNTER — Telehealth: Payer: Self-pay | Admitting: Family Medicine

## 2009-02-28 LAB — CONVERTED CEMR LAB
ALT: <8 units/L (ref 0–35)
BUN: 6 mg/dL (ref 6–23)
Bilirubin, Direct: 0.1 mg/dL (ref 0.0–0.3)
Calcium: 9.1 mg/dL (ref 8.4–10.5)
Cholesterol: 118 mg/dL (ref 0–200)
Glucose, Bld: 111 mg/dL — ABNORMAL HIGH (ref 70–99)
Indirect Bilirubin: 0.2 mg/dL (ref 0.0–0.9)
Potassium: 4.4 meq/L (ref 3.5–5.3)
VLDL: 32 mg/dL (ref 0–40)

## 2009-03-01 ENCOUNTER — Encounter: Payer: Self-pay | Admitting: Family Medicine

## 2009-03-03 ENCOUNTER — Telehealth: Payer: Self-pay | Admitting: Family Medicine

## 2009-03-07 ENCOUNTER — Telehealth: Payer: Self-pay | Admitting: Family Medicine

## 2009-03-08 ENCOUNTER — Ambulatory Visit: Payer: Self-pay | Admitting: Family Medicine

## 2009-03-08 ENCOUNTER — Other Ambulatory Visit: Admission: RE | Admit: 2009-03-08 | Discharge: 2009-03-08 | Payer: Self-pay | Admitting: Family Medicine

## 2009-03-08 LAB — CONVERTED CEMR LAB
Bilirubin Urine: NEGATIVE
Glucose, Urine, Semiquant: NEGATIVE
Hgb A1c MFr Bld: 7.1 %
Specific Gravity, Urine: 1.025
Urobilinogen, UA: 0.2
pH: 5.5

## 2009-03-11 LAB — CONVERTED CEMR LAB
Hepatitis B-Post: 1 milliintl units/mL
Rubella: 87.5 intl units/mL — ABNORMAL HIGH

## 2009-03-18 ENCOUNTER — Encounter: Payer: Self-pay | Admitting: Family Medicine

## 2009-03-21 ENCOUNTER — Ambulatory Visit: Payer: Self-pay | Admitting: Family Medicine

## 2009-03-21 ENCOUNTER — Ambulatory Visit (HOSPITAL_COMMUNITY): Admission: RE | Admit: 2009-03-21 | Discharge: 2009-03-21 | Payer: Self-pay | Admitting: Family Medicine

## 2009-03-21 ENCOUNTER — Telehealth: Payer: Self-pay | Admitting: Family Medicine

## 2009-03-22 ENCOUNTER — Telehealth: Payer: Self-pay | Admitting: Family Medicine

## 2009-03-22 LAB — CONVERTED CEMR LAB
Basophils Absolute: 0 10*3/uL (ref 0.0–0.1)
Basophils Relative: 0 % (ref 0–1)
MCHC: 33.3 g/dL (ref 30.0–36.0)
Neutro Abs: 9.6 10*3/uL — ABNORMAL HIGH (ref 1.7–7.7)
Neutrophils Relative %: 77 % (ref 43–77)
Platelets: 131 10*3/uL — ABNORMAL LOW (ref 150–400)
RBC: 4.11 M/uL (ref 3.87–5.11)
RDW: 14.8 % (ref 11.5–15.5)

## 2009-03-29 ENCOUNTER — Encounter: Payer: Self-pay | Admitting: Family Medicine

## 2009-04-04 ENCOUNTER — Telehealth: Payer: Self-pay | Admitting: Family Medicine

## 2009-04-19 ENCOUNTER — Ambulatory Visit (HOSPITAL_COMMUNITY): Payer: Self-pay | Admitting: Psychiatry

## 2009-05-04 ENCOUNTER — Encounter (INDEPENDENT_AMBULATORY_CARE_PROVIDER_SITE_OTHER): Payer: Self-pay

## 2009-05-04 ENCOUNTER — Emergency Department (HOSPITAL_COMMUNITY): Admission: EM | Admit: 2009-05-04 | Discharge: 2009-05-04 | Payer: Self-pay | Admitting: Emergency Medicine

## 2009-05-04 ENCOUNTER — Ambulatory Visit: Payer: Self-pay | Admitting: Family Medicine

## 2009-05-04 LAB — CONVERTED CEMR LAB
Glucose, Bld: 248 mg/dL
Nitrite: NEGATIVE
Specific Gravity, Urine: 1.025
Urobilinogen, UA: 0.2
WBC Urine, dipstick: NEGATIVE

## 2009-05-06 ENCOUNTER — Encounter: Payer: Self-pay | Admitting: Family Medicine

## 2009-05-10 ENCOUNTER — Ambulatory Visit (HOSPITAL_COMMUNITY): Admission: RE | Admit: 2009-05-10 | Discharge: 2009-05-10 | Payer: Self-pay | Admitting: Family Medicine

## 2009-05-26 ENCOUNTER — Encounter (INDEPENDENT_AMBULATORY_CARE_PROVIDER_SITE_OTHER): Payer: Self-pay | Admitting: *Deleted

## 2009-05-30 ENCOUNTER — Telehealth: Payer: Self-pay | Admitting: Family Medicine

## 2009-06-06 ENCOUNTER — Encounter: Payer: Self-pay | Admitting: Physician Assistant

## 2009-06-06 ENCOUNTER — Ambulatory Visit: Payer: Self-pay | Admitting: Family Medicine

## 2009-06-10 ENCOUNTER — Encounter: Payer: Self-pay | Admitting: Physician Assistant

## 2009-06-10 ENCOUNTER — Telehealth: Payer: Self-pay | Admitting: Physician Assistant

## 2009-06-15 ENCOUNTER — Encounter (INDEPENDENT_AMBULATORY_CARE_PROVIDER_SITE_OTHER): Payer: Self-pay

## 2009-06-15 ENCOUNTER — Telehealth: Payer: Self-pay | Admitting: Family Medicine

## 2009-06-16 ENCOUNTER — Ambulatory Visit (HOSPITAL_COMMUNITY): Payer: Self-pay | Admitting: Psychiatry

## 2009-06-27 ENCOUNTER — Ambulatory Visit: Payer: Self-pay | Admitting: Family Medicine

## 2009-06-29 ENCOUNTER — Encounter (INDEPENDENT_AMBULATORY_CARE_PROVIDER_SITE_OTHER): Payer: Self-pay | Admitting: *Deleted

## 2009-07-21 ENCOUNTER — Telehealth: Payer: Self-pay | Admitting: Family Medicine

## 2009-07-25 ENCOUNTER — Telehealth (INDEPENDENT_AMBULATORY_CARE_PROVIDER_SITE_OTHER): Payer: Self-pay | Admitting: *Deleted

## 2009-09-22 ENCOUNTER — Ambulatory Visit: Payer: Self-pay | Admitting: Family Medicine

## 2009-09-22 LAB — CONVERTED CEMR LAB
Bilirubin Urine: NEGATIVE
Blood in Urine, dipstick: NEGATIVE
Glucose, Urine, Semiquant: NEGATIVE
Ketones, urine, test strip: NEGATIVE
Nitrite: NEGATIVE
Protein, U semiquant: NEGATIVE
Specific Gravity, Urine: 1.02
Urobilinogen, UA: 0.2
WBC Urine, dipstick: NEGATIVE
pH: 7

## 2009-09-23 ENCOUNTER — Encounter: Payer: Self-pay | Admitting: Family Medicine

## 2009-09-23 ENCOUNTER — Telehealth (INDEPENDENT_AMBULATORY_CARE_PROVIDER_SITE_OTHER): Payer: Self-pay | Admitting: *Deleted

## 2009-09-23 LAB — CONVERTED CEMR LAB
Creatinine, Urine: 110 mg/dL
Microalb Creat Ratio: 4.5 mg/g (ref 0.0–30.0)

## 2009-09-27 ENCOUNTER — Ambulatory Visit: Payer: Self-pay | Admitting: Cardiology

## 2009-09-27 DIAGNOSIS — K219 Gastro-esophageal reflux disease without esophagitis: Secondary | ICD-10-CM | POA: Insufficient documentation

## 2009-09-27 DIAGNOSIS — F17200 Nicotine dependence, unspecified, uncomplicated: Secondary | ICD-10-CM | POA: Insufficient documentation

## 2009-09-27 DIAGNOSIS — R002 Palpitations: Secondary | ICD-10-CM

## 2009-09-28 ENCOUNTER — Encounter (INDEPENDENT_AMBULATORY_CARE_PROVIDER_SITE_OTHER): Payer: Self-pay | Admitting: *Deleted

## 2009-09-28 LAB — CONVERTED CEMR LAB
ALT: 8 units/L
Albumin: 4.1 g/dL
Alkaline Phosphatase: 72 units/L
Basophils Absolute: 0 10*3/uL
Bilirubin, Direct: 0.3 mg/dL
Calcium: 9.7 mg/dL
Cholesterol: 156 mg/dL
Eosinophils Relative: 4 %
LDL Cholesterol: 61 mg/dL
Lymphocytes Relative: 36 %
Lymphs Abs: 2.7 10*3/uL
RBC: 4.54 M/uL
RDW: 15.1 %
Sodium: 138 meq/L
Triglycerides: 335 mg/dL

## 2009-09-29 ENCOUNTER — Encounter: Payer: Self-pay | Admitting: Family Medicine

## 2009-09-29 LAB — CONVERTED CEMR LAB
Albumin: 4.1 g/dL (ref 3.5–5.2)
BUN: 12 mg/dL (ref 6–23)
Chloride: 99 meq/L (ref 96–112)
Eosinophils Relative: 4 % (ref 0–5)
HCT: 41.3 % (ref 36.0–46.0)
HDL: 28 mg/dL — ABNORMAL LOW (ref >39)
Hemoglobin: 13.4 g/dL (ref 12.0–15.0)
LDL Cholesterol: 61 mg/dL (ref 0–99)
Lymphocytes Relative: 36 % (ref 12–46)
Lymphs Abs: 2.7 10*3/uL (ref 0.7–4.0)
Monocytes Absolute: 0.3 10*3/uL (ref 0.1–1.0)
Monocytes Relative: 4 % (ref 3–12)
Platelets: 249 10*3/uL (ref 150–400)
Potassium: 4.9 meq/L (ref 3.5–5.3)
RBC: 4.54 M/uL (ref 3.87–5.11)
Total Protein: 6.8 g/dL (ref 6.0–8.3)
Triglycerides: 335 mg/dL — ABNORMAL HIGH (ref <150)
VLDL: 67 mg/dL — ABNORMAL HIGH (ref 0–40)
WBC: 7.5 10*3/uL (ref 4.0–10.5)

## 2009-10-05 ENCOUNTER — Telehealth: Payer: Self-pay | Admitting: Family Medicine

## 2009-10-18 ENCOUNTER — Ambulatory Visit: Payer: Self-pay | Admitting: Family Medicine

## 2009-10-19 ENCOUNTER — Encounter: Payer: Self-pay | Admitting: Physician Assistant

## 2009-10-19 LAB — CONVERTED CEMR LAB
Chlamydia, DNA Probe: NEGATIVE
GC Probe Amp, Genital: NEGATIVE

## 2009-10-21 ENCOUNTER — Emergency Department (HOSPITAL_COMMUNITY): Admission: EM | Admit: 2009-10-21 | Discharge: 2009-10-21 | Payer: Self-pay | Admitting: Emergency Medicine

## 2009-10-21 ENCOUNTER — Encounter: Payer: Self-pay | Admitting: Physician Assistant

## 2009-11-04 ENCOUNTER — Ambulatory Visit: Payer: Self-pay | Admitting: Family Medicine

## 2009-11-09 ENCOUNTER — Encounter (INDEPENDENT_AMBULATORY_CARE_PROVIDER_SITE_OTHER): Payer: Self-pay | Admitting: *Deleted

## 2009-11-16 ENCOUNTER — Ambulatory Visit: Payer: Self-pay | Admitting: Physician Assistant

## 2009-11-16 ENCOUNTER — Encounter (INDEPENDENT_AMBULATORY_CARE_PROVIDER_SITE_OTHER): Payer: Self-pay | Admitting: *Deleted

## 2009-11-16 LAB — CONVERTED CEMR LAB
BUN: 8 mg/dL (ref 6–23)
Basophils Absolute: 0 10*3/uL
Basophils Relative: 0 %
CO2: 26 meq/L (ref 19–32)
Calcium: 9.4 mg/dL (ref 8.4–10.5)
Eosinophils Absolute: 0.1 10*3/uL
Eosinophils Relative: 3 % (ref 0–5)
Glucose, Bld: 147 mg/dL — ABNORMAL HIGH (ref 70–99)
HCT: 40.1 %
HCT: 40.1 % (ref 36.0–46.0)
Hemoglobin: 13.1 g/dL
Lymphocytes Relative: 41 % (ref 12–46)
Lymphs Abs: 2.3 10*3/uL (ref 0.7–4.0)
MCHC: 32.7 g/dL
MCV: 89.7 fL (ref 78.0–100.0)
Monocytes Relative: 6 % (ref 3–12)
Platelets: 174 10*3/uL
Platelets: 174 10*3/uL (ref 150–400)
Potassium: 4.3 meq/L
Potassium: 4.3 meq/L (ref 3.5–5.3)
RBC: 4.47 M/uL (ref 3.87–5.11)
RDW: 15 %
Sodium: 132 meq/L
Sodium: 132 meq/L — ABNORMAL LOW (ref 135–145)
WBC: 5.6 10*3/uL (ref 4.0–10.5)

## 2009-11-17 ENCOUNTER — Emergency Department (HOSPITAL_COMMUNITY): Admission: EM | Admit: 2009-11-17 | Discharge: 2009-11-18 | Payer: Self-pay | Admitting: Emergency Medicine

## 2009-11-18 ENCOUNTER — Telehealth: Payer: Self-pay | Admitting: Family Medicine

## 2009-11-21 ENCOUNTER — Telehealth: Payer: Self-pay | Admitting: Family Medicine

## 2009-11-22 ENCOUNTER — Ambulatory Visit: Payer: Self-pay | Admitting: Family Medicine

## 2009-11-22 ENCOUNTER — Telehealth: Payer: Self-pay | Admitting: Physician Assistant

## 2009-11-23 ENCOUNTER — Encounter: Payer: Self-pay | Admitting: Family Medicine

## 2009-11-24 ENCOUNTER — Telehealth: Payer: Self-pay | Admitting: Family Medicine

## 2009-12-01 ENCOUNTER — Telehealth: Payer: Self-pay | Admitting: Physician Assistant

## 2009-12-06 ENCOUNTER — Ambulatory Visit: Payer: Self-pay | Admitting: Family Medicine

## 2009-12-06 ENCOUNTER — Encounter: Payer: Self-pay | Admitting: Physician Assistant

## 2009-12-06 DIAGNOSIS — G4700 Insomnia, unspecified: Secondary | ICD-10-CM

## 2009-12-07 ENCOUNTER — Telehealth: Payer: Self-pay | Admitting: Physician Assistant

## 2009-12-08 ENCOUNTER — Ambulatory Visit: Payer: Self-pay | Admitting: Family Medicine

## 2009-12-12 ENCOUNTER — Ambulatory Visit: Payer: Self-pay | Admitting: Family Medicine

## 2009-12-12 ENCOUNTER — Encounter (INDEPENDENT_AMBULATORY_CARE_PROVIDER_SITE_OTHER): Payer: Self-pay | Admitting: *Deleted

## 2009-12-12 ENCOUNTER — Telehealth: Payer: Self-pay | Admitting: Physician Assistant

## 2009-12-12 ENCOUNTER — Ambulatory Visit (HOSPITAL_COMMUNITY): Admission: RE | Admit: 2009-12-12 | Discharge: 2009-12-12 | Payer: Self-pay | Admitting: Cardiology

## 2009-12-12 DIAGNOSIS — K648 Other hemorrhoids: Secondary | ICD-10-CM | POA: Insufficient documentation

## 2009-12-13 ENCOUNTER — Emergency Department (HOSPITAL_COMMUNITY): Admission: EM | Admit: 2009-12-13 | Discharge: 2009-12-13 | Payer: Self-pay | Admitting: Emergency Medicine

## 2009-12-14 ENCOUNTER — Encounter: Payer: Self-pay | Admitting: Physician Assistant

## 2009-12-14 ENCOUNTER — Ambulatory Visit: Payer: Self-pay | Admitting: Family Medicine

## 2009-12-14 ENCOUNTER — Encounter (INDEPENDENT_AMBULATORY_CARE_PROVIDER_SITE_OTHER): Payer: Self-pay | Admitting: *Deleted

## 2009-12-14 ENCOUNTER — Telehealth: Payer: Self-pay | Admitting: Physician Assistant

## 2009-12-16 ENCOUNTER — Ambulatory Visit: Payer: Self-pay | Admitting: Cardiology

## 2009-12-21 ENCOUNTER — Emergency Department (HOSPITAL_COMMUNITY): Admission: EM | Admit: 2009-12-21 | Discharge: 2009-12-21 | Payer: Self-pay | Admitting: Emergency Medicine

## 2009-12-22 ENCOUNTER — Encounter (INDEPENDENT_AMBULATORY_CARE_PROVIDER_SITE_OTHER): Payer: Self-pay

## 2009-12-22 ENCOUNTER — Telehealth: Payer: Self-pay | Admitting: Physician Assistant

## 2009-12-22 ENCOUNTER — Ambulatory Visit: Payer: Self-pay | Admitting: Family Medicine

## 2009-12-22 DIAGNOSIS — N920 Excessive and frequent menstruation with regular cycle: Secondary | ICD-10-CM

## 2009-12-22 LAB — CONVERTED CEMR LAB
Beta hcg, urine, semiquantitative: NEGATIVE
Glucose, Bld: 194 mg/dL

## 2009-12-27 ENCOUNTER — Telehealth (INDEPENDENT_AMBULATORY_CARE_PROVIDER_SITE_OTHER): Payer: Self-pay | Admitting: *Deleted

## 2009-12-28 ENCOUNTER — Telehealth (INDEPENDENT_AMBULATORY_CARE_PROVIDER_SITE_OTHER): Payer: Self-pay | Admitting: *Deleted

## 2009-12-28 ENCOUNTER — Encounter (INDEPENDENT_AMBULATORY_CARE_PROVIDER_SITE_OTHER): Payer: Self-pay | Admitting: *Deleted

## 2009-12-28 ENCOUNTER — Telehealth: Payer: Self-pay | Admitting: Physician Assistant

## 2009-12-29 ENCOUNTER — Emergency Department (HOSPITAL_COMMUNITY): Admission: EM | Admit: 2009-12-29 | Discharge: 2009-12-30 | Payer: Self-pay | Admitting: Emergency Medicine

## 2009-12-29 ENCOUNTER — Ambulatory Visit: Payer: Self-pay | Admitting: Internal Medicine

## 2009-12-30 ENCOUNTER — Telehealth: Payer: Self-pay | Admitting: Cardiology

## 2009-12-30 ENCOUNTER — Encounter: Payer: Self-pay | Admitting: Internal Medicine

## 2009-12-31 ENCOUNTER — Emergency Department (HOSPITAL_COMMUNITY): Admission: EM | Admit: 2009-12-31 | Discharge: 2009-12-31 | Payer: Self-pay | Admitting: Emergency Medicine

## 2010-01-01 ENCOUNTER — Telehealth: Payer: Self-pay | Admitting: Family Medicine

## 2010-01-02 ENCOUNTER — Inpatient Hospital Stay (HOSPITAL_COMMUNITY): Admission: EM | Admit: 2010-01-02 | Discharge: 2010-01-08 | Payer: Self-pay | Admitting: Pulmonary Disease

## 2010-01-02 ENCOUNTER — Ambulatory Visit: Payer: Self-pay | Admitting: Internal Medicine

## 2010-01-02 ENCOUNTER — Ambulatory Visit: Payer: Self-pay | Admitting: Cardiology

## 2010-01-02 ENCOUNTER — Encounter (INDEPENDENT_AMBULATORY_CARE_PROVIDER_SITE_OTHER): Payer: Self-pay | Admitting: *Deleted

## 2010-01-02 ENCOUNTER — Encounter: Payer: Self-pay | Admitting: Emergency Medicine

## 2010-01-02 LAB — CONVERTED CEMR LAB
CK-MB: 1.3 ng/mL
Specific Gravity, Urine: 1.023
Troponin I: 0.02 ng/mL
Urine Glucose: >1000 mg/dL
pH: 6

## 2010-01-03 ENCOUNTER — Encounter: Payer: Self-pay | Admitting: Internal Medicine

## 2010-01-03 LAB — CONVERTED CEMR LAB
BUN: 8 mg/dL
CK-MB: 1 ng/mL
GFR calc non Af Amer: >60 mL/min
Glomerular Filtration Rate, Af Am: >60 mL/min/{1.73_m2}
Glucose, Bld: 163 mg/dL
HCT: 30.6 %
Hemoglobin: 10.2 g/dL
WBC: 11.8 10*3/uL

## 2010-01-08 LAB — CONVERTED CEMR LAB
HCT: 34.5 %
Hemoglobin: 11.3 g/dL
Platelets: 342 10*3/uL
WBC: 8.9 10*3/uL

## 2010-01-10 ENCOUNTER — Telehealth (INDEPENDENT_AMBULATORY_CARE_PROVIDER_SITE_OTHER): Payer: Self-pay | Admitting: *Deleted

## 2010-01-16 ENCOUNTER — Ambulatory Visit: Payer: Self-pay | Admitting: Internal Medicine

## 2010-01-16 ENCOUNTER — Encounter (INDEPENDENT_AMBULATORY_CARE_PROVIDER_SITE_OTHER): Payer: Self-pay | Admitting: *Deleted

## 2010-01-16 DIAGNOSIS — J310 Chronic rhinitis: Secondary | ICD-10-CM | POA: Insufficient documentation

## 2010-01-17 ENCOUNTER — Ambulatory Visit: Payer: Self-pay | Admitting: Family Medicine

## 2010-01-17 LAB — CONVERTED CEMR LAB: Blood Glucose, Fingerstick: 331

## 2010-01-18 ENCOUNTER — Telehealth: Payer: Self-pay | Admitting: Family Medicine

## 2010-01-18 LAB — CONVERTED CEMR LAB
BUN: 12 mg/dL (ref 6–23)
Potassium: 4.4 meq/L (ref 3.5–5.3)
Sodium: 131 meq/L — ABNORMAL LOW (ref 135–145)

## 2010-01-19 ENCOUNTER — Ambulatory Visit: Payer: Self-pay | Admitting: Cardiology

## 2010-01-19 ENCOUNTER — Telehealth: Payer: Self-pay | Admitting: Physician Assistant

## 2010-01-19 DIAGNOSIS — I519 Heart disease, unspecified: Secondary | ICD-10-CM

## 2010-01-23 ENCOUNTER — Encounter: Payer: Self-pay | Admitting: Cardiology

## 2010-01-24 ENCOUNTER — Encounter: Payer: Self-pay | Admitting: Cardiology

## 2010-01-26 ENCOUNTER — Telehealth: Payer: Self-pay | Admitting: Family Medicine

## 2010-01-27 ENCOUNTER — Encounter: Payer: Self-pay | Admitting: Family Medicine

## 2010-01-30 ENCOUNTER — Emergency Department (HOSPITAL_COMMUNITY): Admission: EM | Admit: 2010-01-30 | Discharge: 2010-01-31 | Payer: Self-pay | Admitting: Emergency Medicine

## 2010-01-30 ENCOUNTER — Ambulatory Visit: Payer: Self-pay | Admitting: Family Medicine

## 2010-01-30 DIAGNOSIS — R0602 Shortness of breath: Secondary | ICD-10-CM

## 2010-01-30 LAB — CONVERTED CEMR LAB
Calcium: 9.3 mg/dL (ref 8.4–10.5)
Creatinine, Ser: 0.79 mg/dL (ref 0.40–1.20)
Glucose, Bld: 277 mg/dL — ABNORMAL HIGH (ref 70–99)
Sodium: 132 meq/L — ABNORMAL LOW (ref 135–145)
Troponin I: 0.01 ng/mL (ref <0.06)

## 2010-01-31 ENCOUNTER — Telehealth: Payer: Self-pay | Admitting: Family Medicine

## 2010-02-01 ENCOUNTER — Encounter (INDEPENDENT_AMBULATORY_CARE_PROVIDER_SITE_OTHER): Payer: Self-pay | Admitting: *Deleted

## 2010-02-02 ENCOUNTER — Emergency Department (HOSPITAL_COMMUNITY): Admission: EM | Admit: 2010-02-02 | Discharge: 2010-02-02 | Payer: Self-pay | Admitting: Emergency Medicine

## 2010-02-04 ENCOUNTER — Emergency Department (HOSPITAL_COMMUNITY): Admission: EM | Admit: 2010-02-04 | Discharge: 2010-02-05 | Payer: Self-pay | Admitting: Emergency Medicine

## 2010-02-05 ENCOUNTER — Emergency Department (HOSPITAL_COMMUNITY): Admission: EM | Admit: 2010-02-05 | Discharge: 2010-02-05 | Payer: Self-pay | Admitting: Emergency Medicine

## 2010-02-06 ENCOUNTER — Telehealth: Payer: Self-pay | Admitting: Family Medicine

## 2010-02-06 ENCOUNTER — Telehealth: Payer: Self-pay | Admitting: Adult Health

## 2010-02-06 ENCOUNTER — Telehealth (INDEPENDENT_AMBULATORY_CARE_PROVIDER_SITE_OTHER): Payer: Self-pay

## 2010-02-06 ENCOUNTER — Telehealth (INDEPENDENT_AMBULATORY_CARE_PROVIDER_SITE_OTHER): Payer: Self-pay | Admitting: *Deleted

## 2010-02-09 ENCOUNTER — Encounter (HOSPITAL_COMMUNITY)
Admission: RE | Admit: 2010-02-09 | Discharge: 2010-03-11 | Payer: Self-pay | Source: Home / Self Care | Admitting: Internal Medicine

## 2010-02-10 ENCOUNTER — Ambulatory Visit: Payer: Self-pay | Admitting: Internal Medicine

## 2010-02-10 DIAGNOSIS — E875 Hyperkalemia: Secondary | ICD-10-CM | POA: Insufficient documentation

## 2010-02-10 LAB — CONVERTED CEMR LAB
Basophils Relative: 0.7 % (ref 0.0–3.0)
Chloride: 104 meq/L (ref 96–112)
Creatinine, Ser: 1 mg/dL (ref 0.4–1.2)
Eosinophils Relative: 1.4 % (ref 0.0–5.0)
GFR calc non Af Amer: 76.4 mL/min (ref >60)
Lymphocytes Relative: 41.3 % (ref 12.0–46.0)
MCV: 89.7 fL (ref 78.0–100.0)
Monocytes Absolute: 0.4 10*3/uL (ref 0.1–1.0)
Monocytes Relative: 5 % (ref 3.0–12.0)
Neutrophils Relative %: 51.6 % (ref 43.0–77.0)
Pro B Natriuretic peptide (BNP): 16.4 pg/mL (ref 0.0–100.0)
RBC: 4.93 M/uL (ref 3.87–5.11)
Sed Rate: 11 mm/hr (ref 0–22)
WBC: 8.4 10*3/uL (ref 4.5–10.5)

## 2010-02-13 ENCOUNTER — Telehealth: Payer: Self-pay | Admitting: Family Medicine

## 2010-02-14 ENCOUNTER — Telehealth: Payer: Self-pay | Admitting: Family Medicine

## 2010-02-15 ENCOUNTER — Telehealth: Payer: Self-pay | Admitting: Family Medicine

## 2010-02-15 ENCOUNTER — Ambulatory Visit: Payer: Self-pay | Admitting: Internal Medicine

## 2010-02-15 DIAGNOSIS — L259 Unspecified contact dermatitis, unspecified cause: Secondary | ICD-10-CM

## 2010-02-20 ENCOUNTER — Telehealth (INDEPENDENT_AMBULATORY_CARE_PROVIDER_SITE_OTHER): Payer: Self-pay | Admitting: *Deleted

## 2010-02-20 ENCOUNTER — Ambulatory Visit (HOSPITAL_COMMUNITY): Admission: RE | Admit: 2010-02-20 | Discharge: 2010-02-20 | Payer: Self-pay | Admitting: Family Medicine

## 2010-02-20 LAB — CONVERTED CEMR LAB
Basophils Relative: 0.5 % (ref 0.0–3.0)
Eosinophils Relative: 2.8 % (ref 0.0–5.0)
HCT: 39.7 % (ref 36.0–46.0)
Hemoglobin: 13.4 g/dL (ref 12.0–15.0)
Lymphocytes Relative: 40.1 % (ref 12.0–46.0)
Lymphs Abs: 2.7 10*3/uL (ref 0.7–4.0)
Monocytes Relative: 4.7 % (ref 3.0–12.0)
Neutro Abs: 3.5 10*3/uL (ref 1.4–7.7)
RBC: 4.48 M/uL (ref 3.87–5.11)
WBC: 6.7 10*3/uL (ref 4.5–10.5)

## 2010-02-25 ENCOUNTER — Emergency Department (HOSPITAL_COMMUNITY): Admission: EM | Admit: 2010-02-25 | Discharge: 2010-02-25 | Payer: Self-pay | Admitting: Emergency Medicine

## 2010-02-27 ENCOUNTER — Telehealth: Payer: Self-pay | Admitting: Family Medicine

## 2010-02-27 ENCOUNTER — Ambulatory Visit: Payer: Self-pay | Admitting: Family Medicine

## 2010-02-27 DIAGNOSIS — M2550 Pain in unspecified joint: Secondary | ICD-10-CM

## 2010-02-28 ENCOUNTER — Telehealth: Payer: Self-pay | Admitting: Family Medicine

## 2010-02-28 LAB — CONVERTED CEMR LAB
Hgb A1c MFr Bld: 8.8 % — ABNORMAL HIGH (ref <5.7)
Rhuematoid fact SerPl-aCnc: 21 intl units/mL — ABNORMAL HIGH (ref 0–20)
ds DNA Ab: <1 (ref <30)

## 2010-03-01 ENCOUNTER — Encounter: Payer: Self-pay | Admitting: Family Medicine

## 2010-03-01 ENCOUNTER — Encounter (INDEPENDENT_AMBULATORY_CARE_PROVIDER_SITE_OTHER): Payer: Self-pay

## 2010-03-02 ENCOUNTER — Telehealth: Payer: Self-pay | Admitting: Family Medicine

## 2010-03-07 ENCOUNTER — Telehealth: Payer: Self-pay | Admitting: Family Medicine

## 2010-03-14 ENCOUNTER — Emergency Department (HOSPITAL_COMMUNITY)
Admission: EM | Admit: 2010-03-14 | Discharge: 2010-03-15 | Payer: Self-pay | Source: Home / Self Care | Admitting: Emergency Medicine

## 2010-03-15 ENCOUNTER — Encounter: Payer: Self-pay | Admitting: Family Medicine

## 2010-03-16 ENCOUNTER — Telehealth: Payer: Self-pay | Admitting: Family Medicine

## 2010-03-16 ENCOUNTER — Emergency Department (HOSPITAL_COMMUNITY)
Admission: EM | Admit: 2010-03-16 | Discharge: 2010-03-17 | Payer: Self-pay | Source: Home / Self Care | Admitting: Emergency Medicine

## 2010-03-20 ENCOUNTER — Encounter: Payer: Self-pay | Admitting: Family Medicine

## 2010-03-22 ENCOUNTER — Encounter: Payer: Self-pay | Admitting: Family Medicine

## 2010-03-23 ENCOUNTER — Ambulatory Visit: Payer: Self-pay | Admitting: Internal Medicine

## 2010-03-23 ENCOUNTER — Emergency Department (HOSPITAL_COMMUNITY): Admission: EM | Admit: 2010-03-23 | Discharge: 2009-11-01 | Payer: Self-pay | Admitting: Emergency Medicine

## 2010-03-23 ENCOUNTER — Emergency Department (HOSPITAL_COMMUNITY): Admission: EM | Admit: 2010-03-23 | Discharge: 2009-07-03 | Payer: Self-pay | Admitting: Emergency Medicine

## 2010-04-06 ENCOUNTER — Ambulatory Visit: Payer: Self-pay | Admitting: Internal Medicine

## 2010-04-12 ENCOUNTER — Encounter: Payer: Self-pay | Admitting: Cardiology

## 2010-04-12 ENCOUNTER — Encounter: Payer: Self-pay | Admitting: Internal Medicine

## 2010-04-12 ENCOUNTER — Ambulatory Visit
Admission: RE | Admit: 2010-04-12 | Discharge: 2010-04-12 | Payer: Self-pay | Source: Home / Self Care | Attending: Cardiology | Admitting: Cardiology

## 2010-04-12 DIAGNOSIS — K625 Hemorrhage of anus and rectum: Secondary | ICD-10-CM

## 2010-04-13 ENCOUNTER — Encounter: Payer: Self-pay | Admitting: Family Medicine

## 2010-04-14 ENCOUNTER — Emergency Department (HOSPITAL_COMMUNITY)
Admission: EM | Admit: 2010-04-14 | Discharge: 2010-04-14 | Payer: Self-pay | Source: Home / Self Care | Admitting: Family Medicine

## 2010-04-17 ENCOUNTER — Inpatient Hospital Stay (HOSPITAL_COMMUNITY)
Admission: AD | Admit: 2010-04-17 | Discharge: 2010-04-27 | Payer: Self-pay | Attending: Psychiatry | Admitting: Psychiatry

## 2010-04-17 ENCOUNTER — Emergency Department (HOSPITAL_COMMUNITY)
Admission: EM | Admit: 2010-04-17 | Discharge: 2010-04-17 | Disposition: A | Discharge status: Other Acute Inpatient Hospital | Payer: Self-pay | Source: Home / Self Care | Admitting: Emergency Medicine

## 2010-04-19 LAB — GLUCOSE, CAPILLARY: Glucose-Capillary: 238 mg/dL — ABNORMAL HIGH (ref 70–99)

## 2010-04-20 LAB — GLUCOSE, CAPILLARY
Glucose-Capillary: 197 mg/dL — ABNORMAL HIGH (ref 70–99)
Glucose-Capillary: 256 mg/dL — ABNORMAL HIGH (ref 70–99)

## 2010-04-21 LAB — BASIC METABOLIC PANEL
BUN: 9 mg/dL (ref 6–23)
CO2: 24 mEq/L (ref 19–32)
Calcium: 9.2 mg/dL (ref 8.4–10.5)
Chloride: 104 mEq/L (ref 96–112)
Creatinine, Ser: 0.84 mg/dL (ref 0.4–1.2)
GFR calc Af Amer: >60 mL/min (ref >60)
GFR calc non Af Amer: >60 mL/min (ref >60)
Glucose, Bld: 216 mg/dL — ABNORMAL HIGH (ref 70–99)
Potassium: 3.8 mEq/L (ref 3.5–5.1)
Sodium: 138 mEq/L (ref 135–145)

## 2010-04-21 LAB — VALPROIC ACID LEVEL: Valproic Acid Lvl: 60.3 ug/mL (ref 50.0–100.0)

## 2010-04-27 ENCOUNTER — Ambulatory Visit (HOSPITAL_COMMUNITY): Admission: RE | Admit: 2010-04-27 | Payer: Self-pay | Source: Home / Self Care | Admitting: Internal Medicine

## 2010-05-01 LAB — GLUCOSE, CAPILLARY
Glucose-Capillary: 211 mg/dL — ABNORMAL HIGH (ref 70–99)
Glucose-Capillary: 188 mg/dL — ABNORMAL HIGH (ref 70–99)
Glucose-Capillary: 174 mg/dL — ABNORMAL HIGH (ref 70–99)
Glucose-Capillary: 354 mg/dL — ABNORMAL HIGH (ref 70–99)
Glucose-Capillary: 131 mg/dL — ABNORMAL HIGH (ref 70–99)
Glucose-Capillary: 182 mg/dL — ABNORMAL HIGH (ref 70–99)
Glucose-Capillary: 136 mg/dL — ABNORMAL HIGH (ref 70–99)
Glucose-Capillary: 127 mg/dL — ABNORMAL HIGH (ref 70–99)
Glucose-Capillary: 138 mg/dL — ABNORMAL HIGH (ref 70–99)
Glucose-Capillary: 190 mg/dL — ABNORMAL HIGH (ref 70–99)
Glucose-Capillary: 133 mg/dL — ABNORMAL HIGH (ref 70–99)
Glucose-Capillary: 197 mg/dL — ABNORMAL HIGH (ref 70–99)
Glucose-Capillary: 146 mg/dL — ABNORMAL HIGH (ref 70–99)

## 2010-05-01 LAB — VALPROIC ACID LEVEL: Valproic Acid Lvl: 84.6 ug/mL (ref 50.0–100.0)

## 2010-05-04 ENCOUNTER — Emergency Department (HOSPITAL_COMMUNITY)
Admission: EM | Admit: 2010-05-04 | Discharge: 2010-05-04 | Payer: Self-pay | Source: Home / Self Care | Admitting: Emergency Medicine

## 2010-05-04 ENCOUNTER — Inpatient Hospital Stay (HOSPITAL_COMMUNITY)
Admission: EM | Admit: 2010-05-04 | Discharge: 2010-05-15 | Payer: Self-pay | Attending: Psychiatry | Admitting: Psychiatry

## 2010-05-06 ENCOUNTER — Encounter: Payer: Self-pay | Admitting: Internal Medicine

## 2010-05-07 ENCOUNTER — Encounter: Payer: Self-pay | Admitting: Family Medicine

## 2010-05-08 LAB — DIFFERENTIAL
Basophils Absolute: 0 10*3/uL (ref 0.0–0.1)
Eosinophils Relative: 1 % (ref 0–5)
Lymphocytes Relative: 31 % (ref 12–46)
Lymphs Abs: 1.9 10*3/uL (ref 0.7–4.0)
Monocytes Absolute: 0.5 10*3/uL (ref 0.1–1.0)
Monocytes Relative: 7 % (ref 3–12)
Neutro Abs: 3.7 10*3/uL (ref 1.7–7.7)

## 2010-05-08 LAB — RAPID URINE DRUG SCREEN, HOSP PERFORMED
Amphetamines: NOT DETECTED
Opiates: NOT DETECTED
Tetrahydrocannabinol: NOT DETECTED

## 2010-05-08 LAB — ETHANOL: Alcohol, Ethyl (B): <5 mg/dL (ref 0–10)

## 2010-05-08 LAB — GLUCOSE, CAPILLARY

## 2010-05-08 LAB — CBC
HCT: 40.1 % (ref 36.0–46.0)
Hemoglobin: 13.4 g/dL (ref 12.0–15.0)
MCH: 29.1 pg (ref 26.0–34.0)
MCHC: 33.4 g/dL (ref 30.0–36.0)
MCV: 87.2 fL (ref 78.0–100.0)
RDW: 14.4 % (ref 11.5–15.5)

## 2010-05-08 LAB — COMPREHENSIVE METABOLIC PANEL
BUN: 16 mg/dL (ref 6–23)
CO2: 19 mEq/L (ref 19–32)
Calcium: 9.3 mg/dL (ref 8.4–10.5)
Creatinine, Ser: 0.96 mg/dL (ref 0.4–1.2)
GFR calc non Af Amer: >60 mL/min (ref >60)
Glucose, Bld: 211 mg/dL — ABNORMAL HIGH (ref 70–99)
Total Protein: 7.3 g/dL (ref 6.0–8.3)

## 2010-05-09 LAB — GLUCOSE, CAPILLARY
Glucose-Capillary: 152 mg/dL — ABNORMAL HIGH (ref 70–99)
Glucose-Capillary: 148 mg/dL — ABNORMAL HIGH (ref 70–99)
Glucose-Capillary: 167 mg/dL — ABNORMAL HIGH (ref 70–99)
Glucose-Capillary: 132 mg/dL — ABNORMAL HIGH (ref 70–99)

## 2010-05-09 LAB — VALPROIC ACID LEVEL: Valproic Acid Lvl: 48 ug/mL — ABNORMAL LOW (ref 50.0–100.0)

## 2010-05-10 LAB — GLUCOSE, CAPILLARY
Glucose-Capillary: 116 mg/dL — ABNORMAL HIGH (ref 70–99)
Glucose-Capillary: 125 mg/dL — ABNORMAL HIGH (ref 70–99)
Glucose-Capillary: 139 mg/dL — ABNORMAL HIGH (ref 70–99)

## 2010-05-11 LAB — GLUCOSE, CAPILLARY: Glucose-Capillary: 197 mg/dL — ABNORMAL HIGH (ref 70–99)

## 2010-05-12 LAB — GLUCOSE, CAPILLARY
Glucose-Capillary: 132 mg/dL — ABNORMAL HIGH (ref 70–99)
Glucose-Capillary: 188 mg/dL — ABNORMAL HIGH (ref 70–99)

## 2010-05-12 NOTE — H&P (Signed)
[name], [name]             ACCOUNT NO.:  [id number]  MEDICAL RECORD NO.:  [id number]          PATIENT TYPE:  IPS  LOCATION:  0404                          FACILITY:  [facility]  PHYSICIAN:  Anselm Jungling, MD  DATE OF BIRTH:  May 24, 1965  DATE OF ADMISSION:  05/04/2010 DATE OF DISCHARGE:                      PSYCHIATRIC ADMISSION ASSESSMENT   This is a 45 year old female involuntary petitioned on May 04, 2010.  HISTORY OF PRESENT ILLNESS:  The patient is here on petition.  Papers state the patient attempted to jump out of her car today stating it was the devil.  She presented with increased paranoia.  The patient's mother states that patient has been talking to herself, not willing to take her medication as she is fearful and paranoid that someone is trying to kill her family.  Presented with psychotic symptoms and requiring further inpatient assessment. The patient does not provide any details.  Appears to have an exacerbation for psychotic symptoms with some noncompliance with her medications.  PAST PSYCHIATRIC HISTORY:  The patient was recently discharged on January 12 where she was admitted for depression and suicidal thoughts. Appeared profoundly depressed, lethargic and disinterested in living. She was discharged on Protonix 40 mg daily, Depakote, Lexapro, Lopressor, trazodone and Glucophage.  SOCIAL HISTORY:  This is a 45 year old female who is living with her adult son and daughter.  Her marital status is unknown and appears to be on disability.  FAMILY HISTORY:  None.  ALCOHOL/DRUG HISTORY:  No apparent alcohol or substance use.  PRIMARY CARE PROVIDER:  Listed as Dr. Syliva Overman.  MEDICAL PROBLEMS:  A history of diabetes type 2, hypertension and GERD.  MEDICATIONS:  Again listed as: 1. Protonix 40 mg daily. 2. Depakote 1000 mg daily. 3. Lexapro 10 mg daily. 4. Norvasc 2.5 mg daily. 5. Glucophage 500 mg b.i.d. 6. Lopressor 50 daily. 7. Trazodone  100 mg at bedtime.  DRUG ALLERGIES:  The patient is allergic to penicillin, Flagyl, sulfa and Bactrim.  PHYSICAL EXAMINATION:  Physical exam was reviewed with no significant findings.  LABORATORY DATA:  Her laboratory data shows a CBC within normal limits. Urine drug screen is negative.  Alcohol level less than 5.  Sodium of 134, glucose of 211.  MENTAL STATUS EXAM:  The patient at this time is in the day room.  She is lying on a couch.  Very poor eye contact.  She seems sedated but is unable to really provide any significant recent history as to her stressors or symptoms.  ASSESSMENT:  AXIS I:  Psychotic disorder NOS. AXIS II:  Deferred. AXIS III:  A history of hypertension and diabetes type 2. AXIS IV:  Possible other psychosocial problems. AXIS V:  Current is 25-30.  PLAN:  Resume her medications.  Reinforce med compliance.  Continue to assess reality testing.  Let's get family involved for concerns and follow-up and safety issues.  Will continue to monitor her blood sugars. Her tentative length of stay is 4-6 days.     Landry Corporal, N.P.   ______________________________ Anselm Jungling, MD    JO/MEDQ  D:  05/05/2010  T:  05/05/2010  Job:  [id number]  Electronically Signed by Limmie PatriciaP. on 05/09/2010 02:12:47 PM Electronically Signed by Geralyn Flash MD on 05/10/2010 08:46:33 AM

## 2010-05-14 LAB — CONVERTED CEMR LAB: Glucose, Bld: 312 mg/dL

## 2010-05-14 LAB — GLUCOSE, CAPILLARY: Glucose-Capillary: 229 mg/dL — ABNORMAL HIGH (ref 70–99)

## 2010-05-16 NOTE — Letter (Signed)
Summary: Work Writer at [employer]  [address] [place] [address]   Phone: [phone]  Fax: [phone]     December 14, 2009    [facility]   The above named patient had a medical visit today at:   12/14/09 11;00   am / pm.  Please take this into consideration when reviewing the time away from work/school.      Sincerely yours,  Architectural technologist

## 2010-05-16 NOTE — Progress Notes (Signed)
Summary: speak with nurse  Phone Note Call from Patient   Summary of Call: pt needs a accu check. and end of nose is really sore [phone] Initial call taken by: Rudene Anda,  October 05, 2009 11:24 AM  Follow-up for Phone Call        appt with dawn tomorrow Follow-up by: Adella Hare LPN,  October 05, 2009 4:51 PM

## 2010-05-16 NOTE — Letter (Signed)
Summary: Laboratory/X-Ray Results  [facility]  [address]   [place], [place] [address]   Phone: [phone]  Fax: [phone]    Lab/X-Ray Results  October 21, 2009  MRN: [id number]  [name] [address] [place], [place]  [address]    The results of your recent lab/x-ray has been reviewed and were found:  I was unable to reach you by phone.  Your culture results were negative.  If you are still having symptoms please call the office.   If you have any questions, please contact our office.     Esperanza Sheets PA

## 2010-05-16 NOTE — Progress Notes (Signed)
  Phone Note Call from Patient   Caller: Patient Summary of Call: States she was recentlly evaluated in the ED and is being treated for pneumonia. Strates she has on;ly 2 dyas of meds left, she feels no beter, experiencing chills , hR of 123, excess cough. Advised her to return to ED if she feels worse and to call for f/u early this week  Initial call taken by: Syliva Overman MD,  January 01, 2010 2:12 PM

## 2010-05-16 NOTE — Assessment & Plan Note (Signed)
Summary: rescheduled F/U per pt. call/SN  Medications Added GLIPIZIDE XL 2.5 MG XR24H-TAB (GLIPIZIDE) take 1 tab daily DIFLUCAN 150 MG TABS (FLUCONAZOLE) take 1 tab daily FLUTICASONE PROPIONATE 50 MCG/ACT SUSP (FLUTICASONE PROPIONATE) use as directed HYDROCORTISONE ACETATE 30 MG SUPP (HYDROCORTISONE ACETATE) use as directed PROMETHAZINE HCL 12.5 MG TABS (PROMETHAZINE HCL) use as needed AMLODIPINE BESYLATE 2.5 MG TABS (AMLODIPINE BESYLATE) take 1 tablet by mouth once daily      Allergies Added:   Visit Type:  Follow-up Referring Provider:  [facility]  Primary Provider:  Dr. Syliva [name]   History of Present Illness: [name] returns to the office as scheduled for continued assessment and treatment of palpitations, dizziness and chest discomfort.  She has done fairly well since I saw her 3 months ago.  She was evaluated in the emergency department on one occasion with dizziness.  Laboratory studies were all good including a lipid profile.  No specific etiology was identified, and no treatment provided.  She has had occasional chest discomfort, sometimes related to exertion.  A Holter study showed no arrhythmias, but she reported no palpitations.  Two episodes of chest discomfort occurred without significant ST segment abnormalities; however, there was borderline ST segment depression when heart rate exceeded 130 beats per minute.  She believes her depression is under good control, and her psychiatrist has discontinued Geodon.   Current Medications (verified): 1)  Divalproex Sodium 500 Mg Tbec (Divalproex Sodium) .... 2 Tabs At Bedtime 2)  Loratadine 10 Mg  Tabs (Loratadine) .... One Tab By Mouth Once Daily 3)  Metformin Hcl 1000 Mg Tabs (Metformin Hcl) .... Take 1 Tablet By Mouth Two Times A Day 4)  Lovastatin 20 Mg Tabs (Lovastatin) .... 2 Tablets By Mouth At Bedtime 5)  Nitrostat 0.4 Mg Subl (Nitroglycerin) .... Sublingualy Every 5 Min As Needed, Max 3 Tabs in 5 Mn. If No  Improvement Call 911 6)  Omeprazole 20 Mg Tbec (Omeprazole) .... Two Times A Day 7)  Accu Chek Aviva Strips and Lancets .... For Once Daily Testing 8)  Fish Oil 300 Mg Caps (Omega-3 Fatty Acids) .... Take 1 Tablet By Mouth Once A Day 9)  Meclizine Hcl 12.5 Mg Tabs (Meclizine Hcl) .... Take 1 Tablet By Mouth Three Times A Day As Needed For Vertigo/dizziness 10)  Lorazepam 2 Mg Tabs (Lorazepam) .... One Half To One Tab By Mouth At Bedtime 11)  Tramadol Hcl 50 Mg Tabs (Tramadol Hcl) .... Take 1 Every 6 Hrs As Needed For Pain 12)  Flonase 50 Mcg/act Susp (Fluticasone Propionate) .... 2 Puffs Per Nostril Daily 13)  Tylenol Arthritis Pain 650 Mg Cr-Tabs (Acetaminophen) .... One Twice Daily For 5 Days 14)  Promethazine Hcl 12.5 Mg Tabs (Promethazine Hcl) .... Take 1 Every 8 Hrs As Needed For Nausea 15)  Cleocin 100 Mg Supp (Clindamycin Phosphate) .... Insert 1 Vaginally At Bedtime X 3 Nights 16)  Glipizide Xl 2.5 Mg Xr24h-Tab (Glipizide) .... Take 1 Tab Daily 17)  Diflucan 150 Mg Tabs (Fluconazole) .... Take 1 Tab Daily 18)  Fluticasone Propionate 50 Mcg/act Susp (Fluticasone Propionate) .... Use As Directed 19)  Hydrocortisone Acetate 30 Mg Supp (Hydrocortisone Acetate) .... Use As Directed 20)  Promethazine Hcl 12.5 Mg Tabs (Promethazine Hcl) .... Use As Needed 21)  Amlodipine Besylate 2.5 Mg Tabs (Amlodipine Besylate) .... Take 1 Tablet By Mouth Once Daily  Allergies (verified): 1)  ! Pcn 2)  ! Flagyl  Past History:  PMH, FH, and Social History reviewed and updated.  Past Medical History: ASCVD: Minimal at cath in 09/2005; negative pharm. stress nuclear study in 8/08 with nl EF; neg. stress echo'-10 AODM-no insulin; onset 2000 HYPERLIPIDEMIA HYPERTENSION-during treatment treatment with Geodon Tobacco abuse: 30 pack years; continuing at one pack per day Gastroesophageal reflux disease; Schatzki's ring Iron deficiency anemia                                                                                                                                                    DEPRESSION (ICD-311) ALCOHOL ABUSE (ICD-305.00) OBESITY (ICD-278.00)  Family History: Mother is alive at age 37 with hypertension Dad deceased age 98  CVA Sister 4 healthy; one with heart disease Brother 3 healthy  Review of Systems       See history of present illness.  Vital Signs:  Patient profile:   45 year old female Menstrual status:  irregular Weight:      184 pounds BMI:     31.70 Pulse rate:   88 / minute Pulse (ortho):   91 / minute BP sitting:   119 / 85  (right arm) BP standing:   116 / 84  Vitals Entered By: Dreama Saa, CNA (December 16, 2009 11:31 AM)  Serial Vital Signs/Assessments:  Time      Position  BP       Pulse  Resp  Temp     By 11:48 AM  Lying RA  130/85   84                    Sandy Neugent, CNA 11:48 AM  Sitting   115/77   [address] CNA 11:48 AM  Standing  116/84   91                    Sandy Neugent, CNA   Physical Exam  General:  Overweight; well developed; no acute distress:   Neck-No JVD; no carotid bruits: Lungs-No tachypnea, no rales; no rhonchi; no wheezes: Cardiovascular-normal PMI; prominent splitting of S1; normal S2: Abdomen-BS normal; soft and non-tender without masses or organomegaly:  Musculoskeletal-No deformities, no cyanosis or clubbing: Neurologic-Normal cranial nerves; symmetric strength and tone:  Skin-Warm, no significant lesions: Extremities-essentially normall distal pulses; right dorsalis pedis slightly decreased; no edema:     Impression & Recommendations:  Problem # 1:  CHEST PAIN (ICD-786.50) Chest discomfort is well tolerated.  I doubt that this represents myocardial ischemia or that any therapy will result in complete resolution.  We will try amlodipine 2.5 mg q.d. empirically.  She will return in 2 months for reassessment.  Problem # 2:  HYPERTENSION (ICD-401.9) Highest blood pressure recorded in  the office is 140 systolic.  With discontinuation of Geodon, hypertension  is likely to improve or resolve.  Metoprolol and hydrochlorothiazide may be contributing to her general malaise and fatigue and will be discontinued as will be her potassium supplement.  I suspect that amlodipine will provide adequate control of blood pressure.  Problem # 3:  DIZZINESS (ICD-780.4) Patient is very mildly orthostatic at this visit, but complains of dizziness even when sitting when the systolic blood pressure of 130.  I do not think her dizziness is related to orthostatic hypotension; however, hydrochlorothiazide may not be the best drug for her especially since it has caused hypokalemia requiring replacement therapy.  As noted above, both her diuretic and potassium supplement will be discontinued.  Problem # 4:  PALPITATIONS (ICD-785.1) She had no symptomatic spells during Holter monitoring.  I do not think these symptoms are important enough to justify use of an event recorder.  Problem # 5:  TOBACCO ABUSE (ICD-305.1) I again discussed the importance of discontinuation of use of tobacco products; unfortunately, patient does not feel that she could accomplish that task at this time.  Other Orders: Future Orders: T-Comprehensive Metabolic Panel ([id number]) ... 12/30/2009 T- * Misc. Laboratory test [phone]) ... 12/30/2009  Patient Instructions: 1)  Your physician recommends that you schedule a follow-up appointment in: 1 month for Blood pressure check with nurse and in 2 months  2)  Your physician recommends that you return for lab work in: 2 weeks 3)  Your physician has recommended you make the following change in your medication: Stop taking Atenolol, HCTZ and Potassium. Start taking Amlodipine 2.5mg  by mouth once daily  4)  Your physician is advising that you increase salt in your diet 5)  Your physician has requested that you regularly monitor and record your blood pressure readings at home.  Please use  the same machine at the same time of day to check your readings and record them to bring to your follow-up visit. Prescriptions: AMLODIPINE BESYLATE 2.5 MG TABS (AMLODIPINE BESYLATE) take 1 tablet by mouth once daily  #30 x 2   Entered by:   Larita Fife Via LPN   Authorized by:   Kathlen Brunswick, MD, [facility]   Signed by:   Larita Fife Via LPN on 18/84/1660   Method used:   Electronically to        [facility]  [address] (retail)       [address]       [place], [place]  [address]       Ph: [phone]       Fax: [phone]   RxID:   [id number]

## 2010-05-16 NOTE — Assessment & Plan Note (Signed)
Summary: ov   Vital Signs:  Patient profile:   45 year old female Menstrual status:  irregular Height:      64 inches Weight:      180 pounds BMI:     31.01 O2 Sat:      97 % on Room air Pulse rate:   94 / minute Pulse rhythm:   regular Resp:     16 per minute BP sitting:   140 / 90  (left arm)  Vitals Entered By: Adella Hare LPN (January 30, 2010 3:01 PM)  Nutrition Counseling: Patient's BMI is greater than 25 and therefore counseled on weight management options.  O2 Flow:  Room air CC: chest pain, sob Is Patient Diabetic? Yes Pain Assessment Patient in pain? no      Comments did not bring meds to ov   Primary Care Provider:  Dr. Syliva Overman  CC:  chest pain and sob.  History of Present Illness: Pt concerned about increased fatigue, shortness of breath with minimal activity, as well as ches pain, she alsoreports increased back pai, She still smokes. Since her recenr hospitalisation with pneumonia , the pt has had increased anxiety and multiple presentations to the Ed as well as the office with complaints. She denies recent alcohol use she statesher blood sugars are still somewhat elevated but are gradually improving. She also c/o back pain.g  Current Medications (verified): 1)  Metformin Hcl 1000 Mg Tabs (Metformin Hcl) .... Take 1 Tablet By Mouth Two Times A Day 2)  Lovastatin 20 Mg Tabs (Lovastatin) .... 2 Tablets By Mouth At Bedtime 3)  Nitrostat 0.4 Mg Subl (Nitroglycerin) .... Sublingualy Every 5 Min As Needed, Max 3 Tabs in 5 Mn. If No Improvement Call 911 4)  Omeprazole 20 Mg Tbec (Omeprazole) .... Take 1 Tablet By Mouth Two Times A Day 5)  Ferrous Sulfate 325 (65 Fe) Mg Tabs (Ferrous Sulfate) .... Take 1 Tablet By Mouth Two Times A Day 6)  Megestrol Acetate 40 Mg Tabs (Megestrol Acetate) .... Take 1 Tablet By Mouth Once A Day 7)  Klor-Con M20 20 Meq Cr-Tabs (Potassium Chloride Crys Cr) .... Take 1 Tablet By Mouth Once A Day 8)  Metoprolol Tartrate 50  Mg Tabs (Metoprolol Tartrate) .... Take 1 Tablet By Mouth Two Times A Day 9)  Divalproex Sodium 500 Mg Tbec (Divalproex Sodium) .... Take 2 Tablets By Mouth At Bedtime 10)  Lexapro 20 Mg Tabs (Escitalopram Oxalate) .... Take 1 Tablet By Mouth Once A Day 11)  Hydrochlorothiazide 25 Mg Tabs (Hydrochlorothiazide) .... Take 1 Tablet By Mouth Once A Day 12)  Zyrtec Allergy 10 Mg  Tabs (Cetirizine Hcl) .... By Mouth Daily As Needed Itchy Sneezy Runny Nose 13)  Cetirizine Hcl 10 Mg Tabs (Cetirizine Hcl) .... Take 1 Tablet By Mouth Once A Day 14)  Strips and Lancets For True Result Meter .... Three Times A Day Testing 15)  Glimepiride 2 Mg Tabs (Glimepiride) .... One Tab  Every Morning  Allergies (verified): 1)  ! Pcn 2)  ! Flagyl  Review of Systems      See HPI General:  Complains of fatigue, malaise, and weakness. Eyes:  Denies discharge and red eye. CV:  Complains of chest pain or discomfort; left chest tightness radiating to breast , concerned about fartugue winh exertion. Resp:  Complains of shortness of breath; denies cough and sputum productive. MS:  Complains of low back pain and mid back pain; mid and low back pain for the past 7 days,  non radiaiting , no fever or chills, no noted aggravating factors , rated at 8, lying down may provide relief. Derm:  Denies itching, lesion(s), and rash. Neuro:  Denies headaches and seizures. Psych:  Complains of anxiety, depression, mental problems, and sense of great danger; denies suicidal thoughts/plans, thoughts of violence, and unusual visions or sounds. Endo:  Denies cold intolerance, excessive thirst, excessive urination, and heat intolerance. Heme:  Denies abnormal bruising and bleeding. Allergy:  Denies hives or rash and itching eyes.  Physical Exam  General:  Well-developed,adequately,in no acute distress; alert,appropriate and cooperative throughout examination, anxious. HEENT: No facial asymmetry,  EOMI, No sinus tenderness, TM's Clear,  oropharynx  pink and moist.   Chest: Clear to auscultation bilaterally. No reproducible chest wall tenderness CVS: S1, S2, No murmurs, No S3.   Abd: Soft, Nontender.  MS: Adequate ROM spine, hips, shoulders and knees.  Ext: No edema.   CNS: CN 2-12 intact, power tone and sensation normal throughout.   Skin: Intact, no visible lesions or rashes.  Psych: Good eye contact, normal affect.  Memory intact     Impression & Recommendations:  Problem # 1:  CHEST PAIN UNSPECIFIED (ICD-786.50) Assessment Comment Only  Orders: T-Basic Metabolic Panel [id number]) T-D-[facility]) [id number]) T-Troponin I [id number])  Problem # 2:  HYPERTENSION (ICD-401.9) Assessment: Deteriorated  The following medications were removed from the medication list:    Amlodipine Besylate 5 Mg Tabs (Amlodipine besylate) .Marland Kitchen... Take one tablet by mouth daily Her updated medication list for this problem includes:    Metoprolol Tartrate 50 Mg Tabs (Metoprolol tartrate) .Marland Kitchen... Take 1 tablet by mouth two times a day    Hydrochlorothiazide 25 Mg Tabs (Hydrochlorothiazide) .Marland Kitchen... Take 1 tablet by mouth once a day  BP today: 140/90 Prior BP: 110/60 (01/19/2010)  Labs Reviewed: K+: 4.4 (01/17/2010) Creat: : 0.91 (01/17/2010)   Chol: 156 (09/28/2009)   HDL: 28 (09/28/2009)   LDL: 61 (09/28/2009)   TG: 335 (09/28/2009)  Problem # 3:  TOBACCO ABUSE (ICD-305.1) Assessment: Unchanged  Encouraged smoking cessation and discussed different methods for smoking cessation.   Problem # 4:  DIABETES MELLITUS, TYPE II (ICD-250.00) Assessment: Comment Only  The following medications were removed from the medication list:    Glimepiride 2 Mg Tabs (Glimepiride) ..... One tab  every morning Her updated medication list for this problem includes:    Metformin Hcl 1000 Mg Tabs (Metformin hcl) .Marland Kitchen... Take 1 tablet by mouth two times a day    Glimepiride 2 Mg Tabs (Glimepiride) .Marland Kitchen... Take 1 tablet by mouth two times a  day  Labs Reviewed: Creat: 0.91 (01/17/2010)    Reviewed HgBA1c results: 7.2 (09/28/2009)  7.2 (09/28/2009)  Complete Medication List: 1)  Metformin Hcl 1000 Mg Tabs (Metformin hcl) .... Take 1 tablet by mouth two times a day 2)  Lovastatin 20 Mg Tabs (Lovastatin) .... 2 tablets by mouth at bedtime 3)  Nitrostat 0.4 Mg Subl (Nitroglycerin) .... Sublingualy every 5 min as needed, max 3 tabs in 5 mn. if no improvement call 911 4)  Ferrous Sulfate 325 (65 Fe) Mg Tabs (Ferrous sulfate) .... Take 1 tablet by mouth two times a day 5)  Megestrol Acetate 40 Mg Tabs (Megestrol acetate) .... Take 1 tablet by mouth once a day 6)  Klor-con M20 20 Meq Cr-tabs (Potassium chloride crys cr) .... Take 1 tablet by mouth once a day 7)  Metoprolol Tartrate 50 Mg Tabs (Metoprolol tartrate) .... Take 1 tablet by mouth two times a day  8)  Divalproex Sodium 500 Mg Tbec (Divalproex sodium) .... Take 2 tablets by mouth at bedtime 9)  Lexapro 20 Mg Tabs (Escitalopram oxalate) .... Take 1 tablet by mouth once a day 10)  Hydrochlorothiazide 25 Mg Tabs (Hydrochlorothiazide) .... Take 1 tablet by mouth once a day 11)  Zyrtec Allergy 10 Mg Tabs (Cetirizine hcl) .... By mouth daily as needed itchy sneezy runny nose 12)  Cetirizine Hcl 10 Mg Tabs (Cetirizine hcl) .... Take 1 tablet by mouth once a day 13)  Strips and Lancets For True Result Meter  .... Three times a day testing 14)  Glimepiride 2 Mg Tabs (Glimepiride) .... Take 1 tablet by mouth two times a day 15)  Clotrimazole 1 % Crea (Clotrimazole) .... Aqpply twice daily to feet for rash and cracking 16)  Fluconazole 150 Mg Tabs (Fluconazole) .... Take 1 tablet by mouth once a day 17)  Ranitidine Hcl 150 Mg Caps (Ranitidine hcl) .... Take 1 capsule by mouth two times a day discontinue omeprazole  Other Orders: CXR- 2view (CXR) Medicare Electronic Prescription [phone])  Patient Instructions: 1)  Please schedule a follow-up appointment in 2 weeks. 2)  PLS  iNC the  glimepiride to 2mg  one TWICE daily since you report high blood sugars 3)  cXR tyoday. 4)  labs today chem7 , tropnin and d dimer Prescriptions: RANITIDINE HCL 150 MG CAPS (RANITIDINE HCL) Take 1 capsule by mouth two times a day  #60 x 3   Entered and Authorized by:   Syliva Overman MD   Signed by:   Syliva Overman MD on 01/31/2010   Method used:   Printed then faxed to ...       [facility]  [address] [facility] (retail)       [address]       [place], [place]  [address]       Ph: [phone]       Fax: [phone]   RxID:   [id number] FLUCONAZOLE 150 MG TABS (FLUCONAZOLE) Take 1 tablet by mouth once a day  #3 x 0   Entered by:   Mauricia Area CMA   Authorized by:   Syliva Overman MD   Signed by:   Mauricia Area CMA on 01/31/2010   Method used:   Electronically to        [facility]  [address] (retail)       [address]       [place], [place]  [address]       Ph: [phone]       Fax: [phone]   RxID:   [id number] CLOTRIMAZOLE 1 % CREA (CLOTRIMAZOLE) aqpply twice daily to feet for rash and cracking  #45 gm x 2   Entered by:   Mauricia Area CMA   Authorized by:   Syliva Overman MD   Signed by:   Mauricia Area CMA on 01/31/2010   Method used:   Electronically to        [facility]  [address] (retail)       [address]       [place], [place]  [address]       Ph: [phone]       Fax: [phone]   RxID:   [id number] GLIMEPIRIDE 2 MG TABS (GLIMEPIRIDE) Take 1 tablet by mouth two times a day  #60 x 3  Entered by:   Mauricia Area CMA   Authorized by:   Syliva Overman MD   Signed by:   Mauricia Area CMA on 01/31/2010   Method used:   Electronically to        [facility]  [address] (retail)       [address]       [place], [place]  [address]       Ph: [phone]       Fax: [phone]   RxID:   [id number] FLUCONAZOLE 150 MG TABS (FLUCONAZOLE) Take 1 tablet by mouth once a day  #3  x 0   Entered and Authorized by:   Syliva Overman MD   Signed by:   Syliva Overman MD on 01/30/2010   Method used:   Printed then faxed to ...       [facility]  [address] [facility] ([facility]ail)       [address]       [place], [place]  [address]       Ph: [phone]       Fax: [phone]   RxID:   [id number] CLOTRIMAZOLE 1 % CREA (CLOTRIMAZOLE) aqpply twice daily to feet for rash and cracking  #45 gm x 2   Entered and Authorized by:   Syliva Overman MD   Signed by:   Syliva Overman MD on 01/30/2010   Method used:   Printed then faxed to ...       [facility]  [address] [facility] (retail)       [address]       [place], [place]  [address]       Ph: [phone]       Fax: [phone]   RxID:   [id number] GLIMEPIRIDE 2 MG TABS (GLIMEPIRIDE) Take 1 tablet by mouth two times a day  #60 x 0   Entered and Authorized by:   Syliva Overman MD   Signed by:   Syliva Overman MD on 01/30/2010   Method used:   Printed then faxed to ...       [facility]  [address] [facility] (retail)       [address]       [place], [place]  [address]       Ph: [phone]       Fax: [phone]   RxID:   [id number]    Orders Added: 1)  Est. Patient Level IV [[address]] 2)  CXR- 2view [CXR] 3)  T-Basic Metabolic Panel [[id number]] 4)  T-D-[facility]) [85379-DIMR] 5)  T-Troponin I [[id number]] 6)  Medicare Electronic Prescription [phone]

## 2010-05-16 NOTE — Progress Notes (Signed)
  Phone Note From Pharmacy   Caller: [facility]  [address] [facility] Summary of Call: singulair requires pa is there an alternative? Initial call taken by: Lilyan Gilford LPN,  May 30, 2009 3:11 PM  Follow-up for Phone Call        advise and change to zyrtec 10mg  one daily #30 refill4 Follow-up by: Syliva Overman MD,  May 30, 2009 5:06 PM  Additional Follow-up for Phone Call Additional follow up Details #1::        med changed called patient, no answer Additional Follow-up by: Lilyan Gilford LPN,  May 31, 2009 3:23 PM    New/Updated Medications: ZYRTEC ALLERGY 10 MG TABS (CETIRIZINE HCL) one tab by mouth once daily   discontinue singulair Prescriptions: ZYRTEC ALLERGY 10 MG TABS (CETIRIZINE HCL) one tab by mouth once daily   discontinue singulair  #30 x 4   Entered by:   Lilyan Gilford LPN   Authorized by:   Syliva Overman MD   Signed by:   Lilyan Gilford LPN on 16/01/9603   Method used:   Electronically to        [facility]  [address] (retail)       [address]       [place], [place]  [address]       Ph: [phone]       Fax: [phone]   RxID:   [id number]

## 2010-05-16 NOTE — Progress Notes (Signed)
Summary: HFU- pna   appt with MW  Phone Note Call from Patient   Caller: Mom- RACHEL CARGILL Call For: WERT Summary of Call: pt's mom called to schedule a HFU w/ dr wert that is to be 1 wk after discharge from hosp- pt was d/c'd on 9/25. i gave her an appt for 10/13 but if this is too far out let me know and i will call mom and rsc for an earlier date with tp? or advise w/ whom pt should see. thanks. rachel cargill# [phone] Initial call taken by: Tivis Ringer, CNA,  January 10, 2010 12:32 PM  Follow-up for Phone Call        [facility].  I was able to get pt in to see MW next Monday 01/16/2010 at 3:50pm.  Just need to make sure this appt is ok with the patient/mother.  Aundra Millet Reynolds LPN  January 10, 2010 2:14 PM    pt's mother called back. mother aware of new appt date and time. nothing further needed.  Aundra Millet Reynolds LPN  January 10, 2010 2:17 PM

## 2010-05-16 NOTE — Assessment & Plan Note (Signed)
Summary: sick- room 3   Vital Signs:  Patient profile:   45 year old female Menstrual status:  irregular Height:      64 inches Weight:      192.25 pounds BMI:     33.12 O2 Sat:      96 % on Room air Pulse rate:   91 / minute Resp:     16 per minute BP sitting:   121 / 82  (left arm)  Vitals Entered By: Adella Hare LPN (June 06, 2009 3:32 PM)  O2 Flow:  Room air CC: sore throat, legs ache, very fatigued, chills Is Patient Diabetic? Yes Did you bring your meter with you today? No Pain Assessment Patient in pain? no        Primary Care Provider:  Lodema Hong  CC:  sore throat, legs ache, very fatigued, and chills.  History of Present Illness: Pt presents today with c/o body aches, sore thrt, nonproductive cough, & chills.  Onset yesterday.  She has not checked her temp for fever & is not taking any otc cold meds.  Pt states her college daughter has the same syptoms & was told today that she has the flu.  She was home over the wkend.  She admits to some nasal congestion, but no sinus pressure or post nasal drainage.  Cough is not affecting her sleep, etc.  Pt is diabetic.  Is not currently checking BS at home.  Current Medications (verified): 1)  Depakote Er 250 Mg  Tb24 (Divalproex Sodium) .... Take 4  At Bedtime 2)  Atenolol 25 Mg  Tabs (Atenolol) .... Take 1 Tablet By Mouth Once A Day 3)  Ferrous Sulfate 325 (65 Fe) Mg  Tabs (Ferrous Sulfate) .... Take 1 Tablet By Mouth Two Times A Day 4)  Geodon 40 Mg  Caps (Ziprasidone Hcl) .... Take 1 Tab By Mouth At Bedtime 5)  Geodon 80 Mg  Caps (Ziprasidone Hcl) .... Take Two Tablets At Bedtime 6)  Lexapro 10 Mg  Tabs (Escitalopram Oxalate) .... Take Two Tablets in Am 7)  Hydrochlorothiazide 25 Mg  Tabs (Hydrochlorothiazide) .... One Tab By Mouth Once Daily 8)  Loratadine 10 Mg  Tabs (Loratadine) .... One Tab By Mouth Once Daily 9)  Metformin Hcl 1000 Mg Tabs (Metformin Hcl) .... Take 1 Tablet By Mouth Two Times A Day 10)   Lovastatin 40 Mg Tabs (Lovastatin) .... One Tab By Mouth Qhs 11)  Nitrostat 0.4 Mg Subl (Nitroglycerin) .... Sublingualy Every 5 Min As Needed, Max 3 Tabs in 5 Mn. If No Improvement Call 911 12)  Singulair 10 Mg Tabs (Montelukast Sodium) .... Take 1 Tablet By Mouth Once A Day 13)  Klor-Con M20 20 Meq Cr-Tabs (Potassium Chloride Crys Cr) .... Take 2 Tablest By Mouth Two Times A Day 14)  Campral 333 Mg Tbec (Acamprosate Calcium) .... Take2 Three Times A Day 15)  Omeprazole 20 Mg Tbec (Omeprazole) .... Two Times A Day 16)  Trazodone Hcl 50 Mg Tabs (Trazodone Hcl) .... One Half To One Tab By Mouth Qhs 17)  Gabapentin 100 Mg Caps (Gabapentin) .... Take 1 Capsule By Mouth At Bedtime 18)  Septra Ds 800-160 Mg Tabs (Sulfamethoxazole-Trimethoprim) .... Take 1 Tablet By Mouth Two Times A Day 19)  Proventil Hfa 108 (90 Base) Mcg/act Aers (Albuterol Sulfate) .... 2 Puffs Every 6-8 Hrs 20)  Zyrtec Allergy 10 Mg Tabs (Cetirizine Hcl) .... One Tab By Mouth Once Daily   Discontinue Singulair  Allergies (verified): 1)  ! Pcn  PMH-FH-SH reviewed for relevance  Review of Systems General:  Complains of chills, fatigue, and fever; denies loss of appetite. ENT:  Complains of nasal congestion and sore throat; denies earache, postnasal drainage, and sinus pressure. CV:  Denies chest pain or discomfort and palpitations. Resp:  Complains of cough; denies coughing up blood, shortness of breath, sputum productive, and wheezing. GI:  Denies abdominal pain, diarrhea, nausea, and vomiting. MS:  Complains of muscle aches. Heme:  Denies enlarge lymph nodes and fevers.  Physical Exam  General:  Well-developed,well-nourished,in no acute distress; alert,appropriate and cooperative throughout examination Head:  Normocephalic and atraumatic without obvious abnormalities. No apparent alopecia or balding. Ears:  External ear exam shows no significant lesions or deformities.  Otoscopic examination reveals clear canals, tympanic  membranes are intact bilaterally without bulging, retraction, inflammation or discharge. Hearing is grossly normal bilaterally. Nose:  External nasal examination shows no deformity or inflammation. Nasal mucosa are pink and moist without lesions or exudates. Mouth:  Oral mucosa and oropharynx without lesions or exudates.  Teeth in good repair. Neck:  No deformities, masses, or tenderness noted. Lungs:  Normal respiratory effort, chest expands symmetrically. Lungs are clear to auscultation, no crackles or wheezes. Heart:  Normal rate and regular rhythm. S1 and S2 normal without gallop, murmur, click, rub or other extra sounds. Extremities:  No clubbing, cyanosis, edema, or deformity noted with normal full range of motion of all joints.   Cervical Nodes:  No lymphadenopathy noted Psych:  Cognition and judgment appear intact. Alert and cooperative with normal attention span and concentration. No apparent delusions, illusions, hallucinations   Impression & Recommendations:  Problem # 1:  INFLUENZA (ICD-487.8) Assessment New Discussed with pt that she may use Tylenol or ibuprofen as needed for fever & body aches. Recommend she try an otc cough medication. Increase fluids & rest. Discussed she may return to school when she is fever free x 24 hrs w/o the use of medication.  Note given 2-21 thru 2-23. Pt will call if we need to extend this.  Problem # 2:  HYPERTENSION (ICD-401.9) Assessment: Improved  Her updated medication list for this problem includes:    Atenolol 25 Mg Tabs (Atenolol) .Marland Kitchen... Take 1 tablet by mouth once a day    Hydrochlorothiazide 25 Mg Tabs (Hydrochlorothiazide) ..... One tab by mouth once daily  BP today: 121/82 Prior BP: 130/82 (05/04/2009)  Labs Reviewed: K+: 4.4 (02/23/2009) Creat: : 0.80 (02/23/2009)   Chol: 118 (02/23/2009)   HDL: 35 (02/23/2009)   LDL: 51 (02/23/2009)   TG: 162 (02/23/2009)  Problem # 3:  DIABETES MELLITUS, TYPE II (ICD-250.00) Assessment:  Unchanged Discussed with pt her BS may increase during illness.  Her updated medication list for this problem includes:    Metformin Hcl 1000 Mg Tabs (Metformin hcl) .Marland Kitchen... Take 1 tablet by mouth two times a day  Labs Reviewed: Creat: 0.80 (02/23/2009)    Reviewed HgBA1c results: 7.1 (03/08/2009)  6.6 (12/13/2008)  Medications Added to Medication List This Visit: 1)  Tamiflu 75 Mg Caps (Oseltamivir phosphate) .Marland Kitchen.. 1 bid  Complete Medication List: 1)  Depakote Er 250 Mg Tb24 (Divalproex sodium) .... Take 4  at bedtime 2)  Atenolol 25 Mg Tabs (Atenolol) .... Take 1 tablet by mouth once a day 3)  Ferrous Sulfate 325 (65 Fe) Mg Tabs (Ferrous sulfate) .... Take 1 tablet by mouth two times a day 4)  Geodon 40 Mg Caps (Ziprasidone hcl) .... Take 1 tab by mouth at bedtime 5)  Geodon 80  Mg Caps (Ziprasidone hcl) .... Take two tablets at bedtime 6)  Lexapro 10 Mg Tabs (Escitalopram oxalate) .... Take two tablets in am 7)  Hydrochlorothiazide 25 Mg Tabs (Hydrochlorothiazide) .... One tab by mouth once daily 8)  Loratadine 10 Mg Tabs (Loratadine) .... One tab by mouth once daily 9)  Metformin Hcl 1000 Mg Tabs (Metformin hcl) .... Take 1 tablet by mouth two times a day 10)  Lovastatin 40 Mg Tabs (Lovastatin) .... One tab by mouth qhs 11)  Nitrostat 0.4 Mg Subl (Nitroglycerin) .... Sublingualy every 5 min as needed, max 3 tabs in 5 mn. if no improvement call 911 12)  Singulair 10 Mg Tabs (Montelukast sodium) .... Take 1 tablet by mouth once a day 13)  Klor-con M20 20 Meq Cr-tabs (Potassium chloride crys cr) .... Take 2 tablest by mouth two times a day 14)  Campral 333 Mg Tbec (Acamprosate calcium) .... Take2 three times a day 15)  Omeprazole 20 Mg Tbec (Omeprazole) .... Two times a day 16)  Trazodone Hcl 50 Mg Tabs (Trazodone hcl) .... One half to one tab by mouth qhs 17)  Gabapentin 100 Mg Caps (Gabapentin) .... Take 1 capsule by mouth at bedtime 18)  Septra Ds 800-160 Mg Tabs  (Sulfamethoxazole-trimethoprim) .... Take 1 tablet by mouth two times a day 19)  Proventil Hfa 108 (90 Base) Mcg/act Aers (Albuterol sulfate) .... 2 puffs every 6-8 hrs 20)  Zyrtec Allergy 10 Mg Tabs (Cetirizine hcl) .... One tab by mouth once daily   discontinue singulair 21)  Tamiflu 75 Mg Caps (Oseltamivir phosphate) .Marland Kitchen.. 1 bid  Patient Instructions: 1)  Please schedule a follow-up appointment as needed. 2)  Get plenty of rest, drink lots of clear liquids, and use Tylenol or Ibuprofen for fever and comfort. Return in 7-10 days if you're not better:sooner if you're feeling worse. Prescriptions: TAMIFLU 75 MG CAPS (OSELTAMIVIR PHOSPHATE) 1 bid  #10 x 0   Entered by:   Esperanza Sheets PA   Authorized by:   Syliva Overman MD   Signed by:   Esperanza Sheets PA on 06/06/2009   Method used:   Electronically to        [facility]  [address] (retail)       [address]       [place], [place]  [address]       Ph: [phone]       Fax: [phone]   RxID:   [id number]

## 2010-05-16 NOTE — Progress Notes (Signed)
Summary: please advise  Phone Note Call from Patient   Summary of Call: Called patient to remind her of her appt. on tuesday states she went to the ER last night.  she wants to know if Dr. Lodema Hong will call her in some vicodin, the medicine they gave her at ER isn't working.   Initial call taken by: Curtis Sites,  November 18, 2009 8:47 AM  Follow-up for Phone Call        She was here on the 3rd to see [facility] and was given Tramadol.  Additional Follow-up for Phone Call Additional follow up Details #1::        NO VICODIN Additional Follow-up by: Syliva Overman MD,  November 18, 2009 12:16 PM    Additional Follow-up for Phone Call Additional follow up Details #2::    called patient no answer Follow-up by: Everitt Amber LPN,  November 21, 2009 1:43 PM  Additional Follow-up for Phone Call Additional follow up Details #3:: Details for Additional Follow-up Action Taken: will make pt aware if she calls back  Additional Follow-up by: Everitt Amber LPN,  November 22, 2009 7:52 AM

## 2010-05-16 NOTE — Letter (Signed)
Summary: BP LOG  BP LOG   Imported By: Faythe Ghee 01/24/2010 10:25:48  _____________________________________________________________________  External Attachment:    Type:   Image     Comment:   External Document

## 2010-05-16 NOTE — Progress Notes (Signed)
Summary: FEET CREAM   Phone Note Call from Patient   Summary of Call: WANTS TO KNOW CAN YOU CALL IN  A STRONGER CREAM FOR HER FET  STILL CRACKING HURTING AND FEET STARTING TO HURT PLEASE CALL IN AT [facility] IN [place]  CALL BACK AT [phone] TO LET HER KNOW call back at [phone] Initial call taken by: Lind Guest,  March 07, 2010 10:04 AM  Follow-up for Phone Call        med sent in pls let her know Follow-up by: Syliva Overman MD,  March 08, 2010 6:52 AM  Additional Follow-up for Phone Call Additional follow up Details #1::        LEFT MESSAGE Additional Follow-up by: Lind Guest,  March 08, 2010 8:24 AM    New/Updated Medications: CLOTRIMAZOLE-BETAMETHASONE 1-0.05 % CREA (CLOTRIMAZOLE-BETAMETHASONE) apply twice daily to soles of feet x 3 weeks , then as needed Prescriptions: CLOTRIMAZOLE-BETAMETHASONE 1-0.05 % CREA (CLOTRIMAZOLE-BETAMETHASONE) apply twice daily to soles of feet x 3 weeks , then as needed  #45gm x 2   Entered and Authorized by:   Syliva Overman MD   Signed by:   Syliva Overman MD on 03/08/2010   Method used:   Electronically to        [facility]  [address] (retail)       [address]       [place], [place]  [address]       Ph: [phone]       Fax: [phone]   RxID:   [id number]   Appended Document: FEET CREAM  med non formulary, need to send alternate  Appended Document: FEET CREAM  pls call and see if betamethasone cream 0.5% and clotrimazole cream will be covered separately  Appended Document: FEET CREAM  pharmacist did not know if would be separately covered

## 2010-05-16 NOTE — Progress Notes (Signed)
Summary: nos appt  Phone Note Call from Patient   Caller: [email or web address]  Call For: parrett Summary of Call: LMTCB to rsc nos from 10/21. Initial call taken by: Darletta Moll,  February 06, 2010 3:11 PM

## 2010-05-16 NOTE — Assessment & Plan Note (Signed)
Summary: WHEN SHE EATS FOOD IS COMING BACK IN HER THROAT/LAW   Visit Type:  Follow-up Visit Primary Care Provider:  Lodema Hong  Chief Complaint:  Food is coming back up in throat.  History of Present Illness:   45 year old lady with intermittent nausea and vomiting in a setting of poorly controlled diabetes. Has had weakness, dizziness, cough congestion low-grade fever recently. Seen by Drs. Dietrich Pates and Simpson recently. Urine pregnancy test negative. Has had some sporadic diarrhea. On Prilosec for GERD and promethazine for nausea. Noted small blood per rectum recently mixed in her stool. GERD symptoms well-controlled. Last EGD back in 2010 and esophagus dilated. Last colonoscopy 2007 - internal hemorrhoids o/w normal exam of the rectum and colon. She does have a low-grade fever today 100.6.  She really denies abdominal pain currently.   Current Medications (verified): 1)  Divalproex Sodium 500 Mg Tbec (Divalproex Sodium) .... 2 Tabs At Bedtime 2)  Cetirizine Hcl 10 Mg Tabs (Cetirizine Hcl) .... Take 1 Daily For Allergies 3)  Metformin Hcl 1000 Mg Tabs (Metformin Hcl) .... Take 1 Tablet By Mouth Two Times A Day 4)  Lovastatin 20 Mg Tabs (Lovastatin) .... 2 Tablets By Mouth At Bedtime 5)  Nitrostat 0.4 Mg Subl (Nitroglycerin) .... Sublingualy Every 5 Min As Needed, Max 3 Tabs in 5 Mn. If No Improvement Call 911 6)  Omeprazole 20 Mg Tbec (Omeprazole) .... Two Times A Day 7)  Accu Chek Aviva Strips and Lancets .... For Once Daily Testing 8)  Fish Oil 300 Mg Caps (Omega-3 Fatty Acids) .... Take 1 Tablet By Mouth Once A Day 9)  Fluticasone Propionate 50 Mcg/act Susp (Fluticasone Propionate) .... Use As Directed 10)  Promethazine Hcl 12.5 Mg Tabs (Promethazine Hcl) .... Use As Needed 11)  Amlodipine Besylate 2.5 Mg Tabs (Amlodipine Besylate) .... Take 1 Tablet By Mouth Once Daily 12)  Ferrous Sulfate 325 (65 Fe) Mg Tabs (Ferrous Sulfate) .... Take 1 Bid 13)  Cryselle-28 0.3-30 Mg-Mcg Tabs  (Norgestrel-Ethinyl Estradiol) .... Take 3 Daily X 2 Days, Then 2 Daily X 3 Days, Then 1 Daily 14)  Megestrol Acetate 40 Mg Tabs (Megestrol Acetate) .... Take 1 Tablet By Mouth Once A Day  Allergies (verified): 1)  ! Pcn 2)  ! Flagyl  Past History:  Past Medical History: Last updated: 12/16/2009 ASCVD: Minimal at cath in 09/2005; negative pharm. stress nuclear study in 8/08 with nl EF; neg. stress echo'-10 AODM-no insulin; onset 2000 HYPERLIPIDEMIA HYPERTENSION-during treatment treatment with Geodon Tobacco abuse: 30 pack years; continuing at one pack per day Gastroesophageal reflux disease; Schatzki's ring Iron deficiency anemia                                                                                                                                                   DEPRESSION (ICD-311) ALCOHOL ABUSE (ICD-305.00) OBESITY (ICD-278.00)  Past Surgical  History: Last updated: 03/29/2007 D/C-1992  Family History: Last updated: 12/16/2009 Mother is alive at age 56 with hypertension Dad deceased age 43  CVA Sister 4 healthy; one with heart disease Brother 3 healthy  Social History: Last updated: 09/27/2009 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 13-17 Alcohol use-yes, 24 ounces per day Drug use-no Tobacco: 30 pack years; one pack per day  Risk Factors: Caffeine Use: 5+ (05/13/2008)  Risk Factors: Smoking Status: current (05/04/2009) Packs/Day: 1.0 (05/04/2009)  Vital Signs:  Patient profile:   45 year old female Menstrual status:  irregular Height:      64 inches Weight:      190 pounds BMI:     32.73 Temp:     100.6 degrees F oral Pulse rate:   88 / minute BP sitting:   130 / 80  (left arm) Cuff size:   regular  Vitals Entered By: Cloria Spring LPN (December 29, 2009 3:38 PM)  Physical Exam  General:  slightly his coughing and sounds congested but not acutely ill appearing Eyes:  no scleral icterus. Conjunctiva are pink Lungs:  clear to  auscultation Heart:  regular rate and rhythm without murmur gallop rub Abdomen:  nondistended positive bowel sounds soft nontender without appreciable mass or organomegaly  Impression & Recommendations: Impression: 45 year old lady with stable GERD and a subacute illness recent charactaized by dizziness, congestion low-grade fever and cough. She' aready been given promethazine; she continues on Prilosec. Diabetes mellitus somewhat poorly controlled recently. Menorrhagia further workup planned. Urine pregnancy test negative recently.  I suspect this lady most likely has a viral syndrome. She may have underlying diabetic gastroparesis as well. She has had small volume painless hematochezia. Internal hemorrhoids noted on 2007 colonoscopy.  Recommendations: Back on full diet to clear liquids until nausea has settled; continue omeprazole and promethazine. Given her low-grade fever, I've advised to see Dr. Lodema Hong on 916 and not wait until next week.  I feel we should establish a baseline on gastric emptying via GES; will, however,  like to wait until this acute illness is resolved;  plan GES in 3-4 weeks. Will contemplate a diagnostic colonoscopy - has been a good 4 years since she had her colon last imaged.  Further recommendations to follow.  Appended Document: Orders Update    Clinical Lists Changes  Orders: Added new Service order of New Patient Level IV (53664) - Signed

## 2010-05-16 NOTE — Progress Notes (Signed)
Summary: speak with nurse  Phone Note Call from Patient   Summary of Call: pt has appt tomorrow but blood pressure is running high and blood vessel busted in her eye. [phone] Initial call taken by: Rudene Anda,  November 21, 2009 11:03 AM  Follow-up for Phone Call        advised patient she could see dawn today and she declined, states will wait and see dr simpson tomorrow Follow-up by: Adella Hare LPN,  November 21, 2009 11:18 AM

## 2010-05-16 NOTE — Assessment & Plan Note (Signed)
Summary: office visit-feeling bad-ROOM 1   Vital Signs:  Patient profile:   45 year old female Menstrual status:  irregular Height:      64 inches Weight:      191 pounds BMI:     32.90 O2 Sat:      98 % Pulse rate:   98 / minute Resp:     16 per minute BP sitting:   130 / 84  (left arm) Cuff size:   large  Vitals Entered By: Everitt Amber LPN (December 22, 2009 9:48 AM)  CC: has been tired, weak, nausea, lightheaded all going on for a few weeks now. Not feeling good at all Comments forgot to bring meds   Referring Provider:  [facility]  Primary Provider:  Dr. Syliva Overman  CC:  has been tired, weak, nausea, and lightheaded all going on for a few weeks now. Not feeling good at all.  History of Present Illness: Pt states she went to the ER again yesterday due to lightheadedness and nausea.  She states the test results were all negative.  "They couldnt find anything wrong." Pt had f/u with Dr Dietrich Pates last week, and he changed some of her meds to try to help with her syptoms but she says their has been no improvement. She has an appt with GI one week from today. Last psych appt approx 3 weeks ago, next appt in 3 mos. Pt feels that she is doing well mentally and denies feeling depressed.  c/o fatigue, sleeping alot, "some days I dont want to get out of bed", worse last few days She states her blood sugars are running low.  She generally checks right after she eats and runs 80-120.  She hasnt been checking FBS.  She is taking Metformin only now.  Not sexually x > 3 mos.  Last mos menses was light and only 2 days.  This menses heavy flow x 6 days.  Changing protection up to every 1 hr.   Her menses are usually regular, with 4 day moderate flow. No birth control.   Hx of anemia.  Is taking Fe+ two times a day.     Allergies (verified): 1)  ! Pcn 2)  ! Flagyl  Past History:  Past medical history reviewed for relevance to current acute and chronic problems.  Past Medical  History: Reviewed history from 12/16/2009 and no changes required. ASCVD: Minimal at cath in 09/2005; negative pharm. stress nuclear study in 8/08 with nl EF; neg. stress echo'-10 AODM-no insulin; onset 2000 HYPERLIPIDEMIA HYPERTENSION-during treatment treatment with Geodon Tobacco abuse: 30 pack years; continuing at one pack per day Gastroesophageal reflux disease; Schatzki's ring Iron deficiency anemia  DEPRESSION (ICD-311) ALCOHOL ABUSE (ICD-305.00) OBESITY (ICD-278.00)  Review of Systems General:  Denies chills and fever. ENT:  Denies earache, nasal congestion, and sore throat. CV:  Denies chest pain or discomfort and palpitations. Resp:  Denies cough and shortness of breath. GI:  Complains of loss of appetite and nausea; denies abdominal pain and vomiting. GU:  Complains of abnormal vaginal bleeding. Psych:  Denies anxiety and depression.  Physical Exam  General:  Well-developed,well-nourished,in no acute distress; alert,appropriate and cooperative throughout examination.  Pt lying on exam table, appears very sleepy, but is alert and answers all questions and follows all commands. Head:  Normocephalic and atraumatic without obvious abnormalities. No apparent alopecia or balding. Ears:  External ear exam shows no significant lesions or deformities.  Otoscopic examination reveals clear canals, tympanic membranes are intact bilaterally without bulging, retraction, inflammation or discharge. Hearing is grossly normal bilaterally. Nose:  External nasal examination shows no deformity or inflammation. Nasal mucosa are pink and moist without lesions or exudates. Mouth:  Oral mucosa and oropharynx without lesions or exudates.  Neck:  No deformities, masses, or tenderness noted. Lungs:  Normal respiratory effort, chest expands symmetrically. Lungs  are clear to auscultation, no crackles or wheezes. Heart:  Normal rate and regular rhythm. S1 and S2 normal without gallop, murmur, click, rub or other extra sounds. Abdomen:  soft, non-tender, no masses, no hepatomegaly, and no splenomegaly.   Cervical Nodes:  No lymphadenopathy noted Psych:  Cognition and judgment appear intact. Alert and cooperative with normal attention span and concentration. No apparent delusions, illusions, hallucinations   Impression & Recommendations:  Problem # 1:  MENORRHAGIA (ICD-626.2) Assessment New  Her updated medication list for this problem includes:    Cryselle-28 0.3-30 Mg-mcg Tabs (Norgestrel-ethinyl estradiol) .Marland Kitchen... Take 3 daily x 2 days, then 2 daily x 3 days, then 1 daily  Orders: Urine Pregnancy Test  (04540) Gynecologic Referral (Gyn)  Problem # 2:  ANEMIA-IRON DEFICIENCY (ICD-285.9) Assessment: Deteriorated Added iron and ferritin to ER labs.  Her updated medication list for this problem includes:    Ferrous Sulfate 325 (65 Fe) Mg Tabs (Ferrous sulfate) .Marland Kitchen... Take 1 bid  Orders: T-Iron ([id number]) T-Ferritin [phone])  Problem # 3:  DIZZINESS (ICD-780.4) Assessment: Unchanged  The following medications were removed from the medication list:    Promethazine Hcl 12.5 Mg Tabs (Promethazine hcl) .Marland Kitchen... Take 1 every 8 hrs as needed for nausea Her updated medication list for this problem includes:    Loratadine 10 Mg Tabs (Loratadine) ..... One tab by mouth once daily    Meclizine Hcl 12.5 Mg Tabs (Meclizine hcl) .Marland Kitchen... Take 1 tablet by mouth three times a day as needed for vertigo/dizziness    Promethazine Hcl 12.5 Mg Tabs (Promethazine hcl) ..... Use as needed  Problem # 4:  DIABETES MELLITUS, TYPE II (ICD-250.00) Assessment: Comment Only  The following medications were removed from the medication list:    Glipizide Xl 2.5 Mg Xr24h-tab (Glipizide) .Marland Kitchen... Take 1 tab daily Her updated medication list for this problem includes:     Metformin Hcl 1000 Mg Tabs (Metformin hcl) .Marland Kitchen... Take 1 tablet by mouth two times a day  Orders: Glucose, (CBG) [phone])  Labs Reviewed: Creat: 0.78 (11/16/2009)    Reviewed HgBA1c results: 7.2 (09/28/2009)  7.2 (09/28/2009)  Complete Medication List: 1)  Divalproex Sodium 500 Mg Tbec (Divalproex sodium) .... 2 tabs at bedtime 2)  Loratadine 10 Mg Tabs (Loratadine) .... One tab by mouth once daily 3)  Metformin Hcl 1000 Mg  Tabs (Metformin hcl) .... Take 1 tablet by mouth two times a day 4)  Lovastatin 20 Mg Tabs (Lovastatin) .... 2 tablets by mouth at bedtime 5)  Nitrostat 0.4 Mg Subl (Nitroglycerin) .... Sublingualy every 5 min as needed, max 3 tabs in 5 mn. if no improvement call 911 6)  Omeprazole 20 Mg Tbec (Omeprazole) .... Two times a day 7)  Accu Chek Aviva Strips and Lancets  .... For once daily testing 8)  Fish Oil 300 Mg Caps (Omega-3 fatty acids) .... Take 1 tablet by mouth once a day 9)  Meclizine Hcl 12.5 Mg Tabs (Meclizine hcl) .... Take 1 tablet by mouth three times a day as needed for vertigo/dizziness 10)  Fluticasone Propionate 50 Mcg/act Susp (Fluticasone propionate) .... Use as directed 11)  Promethazine Hcl 12.5 Mg Tabs (Promethazine hcl) .... Use as needed 12)  Amlodipine Besylate 2.5 Mg Tabs (Amlodipine besylate) .... Take 1 tablet by mouth once daily 13)  Ferrous Sulfate 325 (65 Fe) Mg Tabs (Ferrous sulfate) .... Take 1 bid 14)  Cryselle-28 0.3-30 Mg-mcg Tabs (Norgestrel-ethinyl estradiol) .... Take 3 daily x 2 days, then 2 daily x 3 days, then 1 daily  Patient Instructions: 1)  Please schedule a follow-up appointment in 1 month. 2)  I am referring you to a gynecologist. 3)  Keep your GI appt next week. 4)  I have prescribed 1 pack of birth control pills to help stop your bleeding.  Follow the directions on the package as we discussed. 5)  Continue taking your iron pills two times a day. 6)  If your bleeding becomes heavier, and you have to change your  protection less than every hour you will need to go to the ER. Prescriptions: CRYSELLE-28 0.3-30 MG-MCG TABS (NORGESTREL-ETHINYL ESTRADIOL) take 3 daily x 2 days, then 2 daily x 3 days, then 1 daily  #1 pack x 0   Entered and Authorized by:   Esperanza Sheets PA   Signed by:   Esperanza Sheets PA on 12/22/2009   Method used:   Electronically to        [facility]  [address] (retail)       [address]       [place], [place]  [address]       Ph: [phone]       Fax: [phone]   RxID:   [id number]   Laboratory Results   Urine Tests      Urine HCG: negative  Blood Tests     Glucose (random): 194 mg/dL   (Normal Range: 06-269)

## 2010-05-16 NOTE — Letter (Signed)
Summary: Work Excuse  [facility]  [address]   [place], [place] [address]   Phone: [phone]  Fax: [phone]    Today's Date: December 14, 2009  Name of Patient: [name]  The above named patient had a medical visit today.  Please take this into consideration when reviewing the time away from work/school.    Special Instructions:  [ * ] None  [  ] To be off the remainder of today, returning to the normal work / school schedule tomorrow.  [  ] To be off until the next scheduled appointment on ______________________.  [  ] Other ________________________________________________________________ ________________________________________________________________________   Sincerely yours,   Esperanza Sheets, PA-C

## 2010-05-16 NOTE — Progress Notes (Signed)
Summary: medicine  Phone Note Call from Patient   Summary of Call: needs her acid reflux medicine needs pre auth. been waiting a couple of days and needs this medicine asap Initial call taken by: Lind Guest,  January 26, 2010 11:40 AM  Follow-up for Phone Call        Pt aware we are waiting on PA a,d sent in strips and lancets to [facility] Follow-up by: Everitt Amber LPN,  January 26, 2010 2:41 PM    New/Updated Medications: * STRIPS AND LANCETS FOR TRUE RESULT METER three times a day testing Prescriptions: STRIPS AND LANCETS FOR TRUE RESULT METER three times a day testing  #13month x 3   Entered by:   Everitt Amber LPN   Authorized by:   Syliva Overman MD   Signed by:   Everitt Amber LPN on 09/81/1914   Method used:   Printed then faxed to ...       [facility]  [address] (retail)       [address]       [place], [place]  [address]       Ph: [phone]       Fax: [phone]   RxID:   [id number]

## 2010-05-16 NOTE — Progress Notes (Signed)
  Phone Note Call from Patient   Summary of Call: On Call Note:  8-331-11 6:53 am Pt states she thinks she may be having an allergic rxn to Metronidazole.  C/o itching all over and face feels swollen.  Denies swelling of tongue, throat, diff swallowing or difficulty breathing.  Advised pt to discontinue Metronidazole. Take Benadryl.  Call the office at 8 am when it opens for an appt. Initial call taken by: Esperanza Sheets PA,  December 14, 2009 8:50 AM

## 2010-05-16 NOTE — Assessment & Plan Note (Signed)
Summary: f up from ed   Vital Signs:  Patient profile:   45 year old female Menstrual status:  irregular Height:      64 inches Weight:      190 pounds BMI:     32.73 O2 Sat:      95 % Pulse rate:   87 / minute Pulse rhythm:   regular Resp:     16 per minute BP sitting:   124 / 80  (left arm) Cuff size:   large  Vitals Entered By: Everitt Amber LPN (November 04, 2009 10:57 AM)  Nutrition Counseling: Patient's BMI is greater than 25 and therefore counseled on weight management options. CC: ER follow up, also found a lump on her right side, not sore or tender   Primary Care Provider:  Dr. Syliva Overman  CC:  ER follow up, also found a lump on her right side, and not sore or tender.  History of Present Illness: Pt was recently in the ed for assesmet of vertigo. She has been drinking more than ususal this past week reportedly when she was with her family at the beach. She has increased abdominal apin also. She denies  any recent fever or chills, but has chronic fatigue and uncontrolled blood sugars. She also has concerns about a lump on her chest, which , on exam is no .onger palpable in the area of concern.  Preventive Screening-Counseling & Management  Alcohol-Tobacco     Smoking Cessation Counseling: yes  Current Medications (verified): 1)  Divalproex Sodium 500 Mg Tbec (Divalproex Sodium) .... 2 Tabs At Bedtime 2)  Atenolol 25 Mg  Tabs (Atenolol) .... Take 1 Tablet By Mouth Once A Day 3)  Ferrous Sulfate 325 (65 Fe) Mg  Tabs (Ferrous Sulfate) .... Take 1 Tablet By Mouth Two Times A Day 4)  Geodon 80 Mg  Caps (Ziprasidone Hcl) .... Take 1 Tab By Mouth At Bedtime 5)  Lexapro 20 Mg Tabs (Escitalopram Oxalate) .Marland Kitchen.. 1 Tablet By Mouth in Mornings 6)  Hydrochlorothiazide 25 Mg  Tabs (Hydrochlorothiazide) .... One Tab By Mouth Once Daily 7)  Loratadine 10 Mg  Tabs (Loratadine) .... One Tab By Mouth Once Daily 8)  Metformin Hcl 1000 Mg Tabs (Metformin Hcl) .... Take 1 Tablet By Mouth  Two Times A Day 9)  Lovastatin 20 Mg Tabs (Lovastatin) .... 2 Tablets By Mouth At Bedtime 10)  Nitrostat 0.4 Mg Subl (Nitroglycerin) .... Sublingualy Every 5 Min As Needed, Max 3 Tabs in 5 Mn. If No Improvement Call 911 11)  Klor-Con M20 20 Meq Cr-Tabs (Potassium Chloride Crys Cr) .... Take 1 Tablet By Mouth Two Times A Day 12)  Omeprazole 20 Mg Tbec (Omeprazole) .... Two Times A Day 13)  Accu Chek Aviva Strips and Lancets .... For Once Daily Testing 14)  Fish Oil 300 Mg Caps (Omega-3 Fatty Acids) .... Take 1 Tablet By Mouth Once A Day  Allergies (verified): 1)  ! Pcn  Review of Systems      See HPI General:  Complains of fatigue and sleep disorder; denies chills and fever. Eyes:  Denies discharge and vision loss-1 eye. ENT:  Denies hoarseness, nasal congestion, sinus pressure, and sore throat. CV:  Denies chest pain or discomfort, palpitations, swelling of feet, and swelling of hands. Resp:  Complains of cough; denies shortness of breath and sputum productive. GI:  Complains of abdominal pain; denies constipation, diarrhea, nausea, and vomiting; intermittent abdominal pain, ststes she was recently at the beach and has been drinking  alcohol. GU:  Denies dysuria and urinary frequency. MS:  Complains of muscle weakness and stiffness; states she has been having episodes of left lower ext weakness like the leg will giveout. Derm:  Denies itching and rash. Neuro:  Complains of sensation of room spinning; 3 day h/o vertigo earlier this week , first time this ever happened to he, she went to the Ed, states her potassium was fine. Psych:  Complains of anxiety, depression, and mental problems; denies suicidal thoughts/plans and thoughts of violence. Endo:  Complains of excessive thirst and excessive urination; blood sugars remain elevated, primarily due to dietary indicretion and alcohol use. Heme:  Denies abnormal bruising and bleeding. Allergy:  Complains of seasonal allergies; denies hives or  rash and itching eyes.  Physical Exam  General:  Well-developed,well-nourished,in no acute distress; alert,appropriate and cooperative throughout examination HEENT: No facial asymmetry,  EOMI, No sinus tenderness, TM's Clear, oropharynx  pink and moist.   Chest: Clear to auscultation bilaterally.  CVS: S1, S2, No murmurs, No S3.   Abd: Soft, Nontender.  MS: Adequate ROM spine, hips, shoulders and knees.  Ext: No edema.   CNS: CN 2-12 intact, power tone and sensation normal throughout.   Skin: Intact, no visible lesions or rashes.  Psych: Good eye contact, normal affect.  Memory intact, not anxious or depressed appearing.   Diabetes Management Exam:    Foot Exam (with socks and/or shoes not present):       Sensory-Monofilament:          Left foot: diminished          Right foot: diminished       Inspection:          Left foot: normal          Right foot: normal       Nails:          Left foot: thickened          Right foot: thickened   Impression & Recommendations:  Problem # 1:  INTERMITTENT VERTIGO (ICD-780.4) Assessment Deteriorated  Her updated medication list for this problem includes:    Loratadine 10 Mg Tabs (Loratadine) ..... One tab by mouth once daily    Meclizine Hcl 12.5 Mg Tabs (Meclizine hcl) .Marland Kitchen... Take 1 tablet by mouth three times a day as needed for vertigo/dizziness  Problem # 2:  HYPERLIPIDEMIA (ICD-272.4) Assessment: Comment Only  Her updated medication list for this problem includes:    Lovastatin 20 Mg Tabs (Lovastatin) .Marland Kitchen... 2 tablets by mouth at bedtime  Labs Reviewed: SGOT: 8 (09/28/2009)   SGPT: 8 (09/28/2009)   HDL:28 (09/28/2009), 35 (02/23/2009)  LDL:61 (09/28/2009), 51 (02/23/2009)  Chol:156 (09/28/2009), 118 (02/23/2009)  Trig:335 (09/28/2009), 162 (02/23/2009)  Problem # 3:  HYPERTENSION (ICD-401.9) Assessment: Unchanged  Her updated medication list for this problem includes:    Atenolol 25 Mg Tabs (Atenolol) .Marland Kitchen... Take 1 tablet by  mouth once a day    Hydrochlorothiazide 25 Mg Tabs (Hydrochlorothiazide) ..... One tab by mouth once daily  Orders: T-Basic Metabolic Panel [id number])  BP today: 124/80 Prior BP: 130/70 (10/18/2009)  Labs Reviewed: K+: 4.9 (09/28/2009) Creat: : 0.90 (09/28/2009)   Chol: 156 (09/28/2009)   HDL: 28 (09/28/2009)   LDL: 61 (09/28/2009)   TG: 335 (09/28/2009)  Problem # 4:  TOBACCO ABUSE (ICD-305.1) Assessment: Unchanged  Encouraged smoking cessation and discussed different methods for smoking cessation.   Problem # 5:  GASTROESOPHAGEAL REFLUX DISEASE (ICD-530.81) Assessment: Deteriorated  Her updated medication list  for this problem includes:    Omeprazole 20 Mg Tbec (Omeprazole) .Marland Kitchen..Marland Kitchen Two times a day  Problem # 6:  ALCOHOL ABUSE (ICD-305.00) Assessment: Deteriorated  Complete Medication List: 1)  Divalproex Sodium 500 Mg Tbec (Divalproex sodium) .... 2 tabs at bedtime 2)  Atenolol 25 Mg Tabs (Atenolol) .... Take 1 tablet by mouth once a day 3)  Ferrous Sulfate 325 (65 Fe) Mg Tabs (Ferrous sulfate) .... Take 1 tablet by mouth two times a day 4)  Geodon 80 Mg Caps (Ziprasidone hcl) .... Take 1 tab by mouth at bedtime 5)  Lexapro 20 Mg Tabs (Escitalopram oxalate) .Marland Kitchen.. 1 tablet by mouth in mornings 6)  Hydrochlorothiazide 25 Mg Tabs (Hydrochlorothiazide) .... One tab by mouth once daily 7)  Loratadine 10 Mg Tabs (Loratadine) .... One tab by mouth once daily 8)  Metformin Hcl 1000 Mg Tabs (Metformin hcl) .... Take 1 tablet by mouth two times a day 9)  Lovastatin 20 Mg Tabs (Lovastatin) .... 2 tablets by mouth at bedtime 10)  Nitrostat 0.4 Mg Subl (Nitroglycerin) .... Sublingualy every 5 min as needed, max 3 tabs in 5 mn. if no improvement call 911 11)  Klor-con M20 20 Meq Cr-tabs (Potassium chloride crys cr) .... Take 1 tablet by mouth two times a day 12)  Omeprazole 20 Mg Tbec (Omeprazole) .... Two times a day 13)  Accu Chek Aviva Strips and Lancets  .... For once daily  testing 14)  Fish Oil 300 Mg Caps (Omega-3 fatty acids) .... Take 1 tablet by mouth once a day 15)  Meclizine Hcl 12.5 Mg Tabs (Meclizine hcl) .... Take 1 tablet by mouth three times a day as needed for vertigo/dizziness  Other Orders: T- Hemoglobin A1C ([id number])  Patient Instructions: 1)  F/U in mid September. 2)    3)  Tobacco is very bad for your health and your loved ones! You Should stop smoking!. 4)  Stop Smoking Tips: Choose a Quit date. Cut down before the Quit date. decide what you will do as a substitute when you feel the urge to smoke(gum,toothpick,exercise). 5)  HBA1C and chem 7 in mid Sept 6)  meds are sent in for dizzy spells ,. use only if needed Prescriptions: MECLIZINE HCL 12.5 MG TABS (MECLIZINE HCL) Take 1 tablet by mouth three times a day as needed for vertigo/dizziness  #30 x 0   Entered and Authorized by:   Syliva Overman MD   Signed by:   Syliva Overman MD on 11/04/2009   Method used:   Electronically to        [facility]  [address] (retail)       [address]       [place], [place]  [address]       Ph: [phone]       Fax: [phone]   RxID:   [id number]

## 2010-05-16 NOTE — Progress Notes (Signed)
Summary: wants allergy appt  Phone Note Call from Patient   Summary of Call: swollen lips     still itching and still has bumps on her back  face swoolen under eyes wants to see dr young Initial call taken by: Lind Guest,  February 14, 2010 3:38 PM  Follow-up for Phone Call        she has an appt tomorrow @ 9:15am, gave appt to her mother Follow-up by: Adella Hare LPN,  February 14, 2010 3:48 PM  Additional Follow-up for Phone Call Additional follow up Details #1::        confirmed appt again with her mother, still unable to reach patient Additional Follow-up by: Adella Hare LPN,  February 14, 2010 3:51 PM

## 2010-05-16 NOTE — Letter (Signed)
Summary: Appointment - Missed  [facility]     [place], [place]    Phone:   Fax:      January 16, 2010 MRN: [id number]   [name] [address] [place], [place]  [address]   Dear [name],  Our records indicate you missed your appointment on           01/16/10 NURSE VISIT                                It is very important that we reach you to reschedule this appointment. We look forward to participating in your health care needs. Please contact us at the number listed above at your earliest convenience to reschedule this appointment.     Sincerely,    Glass blower/designer

## 2010-05-16 NOTE — Assessment & Plan Note (Signed)
Summary: allergies///kp   Vital Signs:  Patient profile:   45 year old female Menstrual status:  irregular Height:      64 inches Weight:      176.50 pounds BMI:     30.41 O2 Sat:      100 % on Room air Pulse rate:   112 / minute BP sitting:   130 / 86  (left arm) Cuff size:   regular  Vitals Entered By: Reynaldo Minium CMA (February 15, 2010 9:43 AM)  O2 Flow:  Room air   Copy to:  [facility]  Primary Provider/Referring Provider:  Dr. Syliva Overman   History of Present Illness: February 15, 2010- 44yoF referred courtesy of Dr Lodema Hong for allergy evaluation,  requested for pruritic rash on lips, breasts and buttocks that started within a day after she was put on sulfa drug 01/30/10 for UTI. Now present daily x 2 weeks. On 02/02/10 she went back to ER  and sulfa drug was changed to cephalexin, but so far rash and bladder discomfort are not improved. Hx of allergy to PCN and now sulfa. comments that she has recently been eating a lot of peanut butter. Taking Zyrtec daily and Benadryl most nights for hx of allergic rhininits. [facility] 01/03/10 for acute respiratory failure with bilateral effusions attributed to CAP, ? viral. That resolved. Labs from f/u on 02/10/10- WBC 8,400, EOS 1.4%, ESR 11- none of which suggests and allergic process at that time. Office notes then don't describe a complaint of pruitic rash.  I am asked to get a d-dimer level drawn, residual from that, since lab missed the request.   Preventive Screening-Counseling & Management  Alcohol-Tobacco     Smoking Status: current     Smoking Cessation Counseling: yes     Packs/Day: 1.0     Tobacco Counseling: to quit use of tobacco products  Comments: She says she is now smoking 9 cigarettes/ day. Has previously tried [employer].  Current Medications (verified): 1)  Metformin Hcl 1000 Mg Tabs (Metformin Hcl) .... Take 1 Tablet By Mouth Two Times A Day 2)  Lovastatin 20 Mg Tabs (Lovastatin) .... 2 Tablets By Mouth  At Bedtime 3)  Nitrostat 0.4 Mg Subl (Nitroglycerin) .... Sublingualy Every 5 Min As Needed, Max 3 Tabs in 5 Mn. If No Improvement Call 911 4)  Ferrous Sulfate 325 (65 Fe) Mg Tabs (Ferrous Sulfate) .... Take 1 Tablet By Mouth Two Times A Day 5)  Klor-Con M20 20 Meq Cr-Tabs (Potassium Chloride Crys Cr) .... Take 1 Tablet By Mouth Once A Day 6)  Metoprolol Tartrate 50 Mg Tabs (Metoprolol Tartrate) .... Take 1 Tablet By Mouth Two Times A Day 7)  Divalproex Sodium 500 Mg Tbec (Divalproex Sodium) .... Take 2 Tablets By Mouth At Bedtime 8)  Lexapro 20 Mg Tabs (Escitalopram Oxalate) .... Take 1 Tablet By Mouth Once A Day 9)  Hydrochlorothiazide 25 Mg Tabs (Hydrochlorothiazide) .... Take 1 Tablet By Mouth Once A Day 10)  Zyrtec Allergy 10 Mg  Tabs (Cetirizine Hcl) .... By Mouth Daily As Needed Itchy Sneezy Runny Nose 11)  Strips and Lancets For True Result Meter .... Three Times A Day Testing 12)  Glimepiride 2 Mg Tabs (Glimepiride) .... Take 1 Tablet By Mouth Two Times A Day 13)  Clotrimazole 1 % Crea (Clotrimazole) .... Aqpply Twice Daily To Feet For Rash and Cracking 14)  Nexium 40 Mg Cpdr (Esomeprazole Magnesium) .Marland Kitchen.. 1 By Mouth Daily For Acid Reflux 15)  Tylenol Extra  Strength 500 Mg Tabs (Acetaminophen) .... Take 2  Capsules Eveyr 8 Hours As Needed 16)  Pepcid 20 Mg Tabs (Famotidine) .... Take 1 By Mouth Two Times A Day X 7days As Needed Allergic Reaction 17)  Vistaril 25 Mg Caps (Hydroxyzine Pamoate) .... Take 1 By Mouth Every 6hours As Needed Itching/rash 18)  Complete Allergy Medicine 25 Mg Tabs (Diphenhydramine Hcl) .... Take 1 By Mouth Once Daily As Needed Allergies 19)  Trazodone Hcl 100 Mg Tabs (Trazodone Hcl) .... Take 1 By Mouth At Bedtime As Needed Insomnia 20)  Fluticasone Propionate 50 Mcg/act Susp (Fluticasone Propionate) .Marland Kitchen.. 1-2 Sprays in Each Nostril Once Daily As Needed 21)  Norco 5-325 Mg Tabs (Hydrocodone-Acetaminophen) .... Take 1-2 By Mouth Every 4-6 Hours As Needed Pain 22)   Keflex 500 Mg Caps (Cephalexin) .... Take 1 By Mouth Four Times A Day With Food For Uti 23)  Cheratussin Ac 100-10 Mg/23ml Syrp (Guaifenesin-Codeine) .Marland Kitchen.. 1 Teaspoonful Every 4-6 Hours As Needed Cough 24)  Advil 200 Mg Tabs (Ibuprofen) .... Take 1-2 By Mouth Once Daily As Needed  Allergies (verified): 1)  ! Pcn 2)  ! Flagyl 3)  ! 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 stress nuclear study in 8/08 with nl EF; neg. stress echo'-10 AODM-no insulin; onset 2000 HYPERLIPIDEMIA HYPERTENSION-during treatment treatment with Geodon Tobacco abuse: 30 pack years; continuing at one pack per day Community pneumonia w/ pleural effusions- [facility] acute resp failure 01/03/10 Gastroesophageal reflux disease; Schatzki's ring Iron deficiency anemia                                                                                                                                                DEPRESSION (ICD-311) ALCOHOL ABUSE (ICD-305.00) OBESITY (ICD-278.00) Diastolic dysfunction 12/2009 Pruritic Rash 111/2/11  Social History: 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  The patient denies shortness of breath at rest, productive cough, non-productive cough, coughing up blood, irregular heartbeats, acid heartburn, abdominal pain, difficulty swallowing, sore throat, tooth/dental problems, headaches, nasal congestion/difficulty breathing through nose, sneezing,  ear ache, depression, hand/feet swelling, joint stiffness or pain, change in color of mucus, and fever.    Physical Exam  Additional Exam:  General: A/Ox3; pleasant and cooperative, NAD, SKIN: Shows few small excoriated spots approx 2 mm diameter w/ no associated inflammation NODES: no lymphadenopathy HEENT: [place]/AT, EOM- WNL, Conjuctivae- clear, PERRLA, TM-WNL, Nose- clear, Throat- clear and wnl NECK: Supple w/ fair ROM, JVD- none, normal carotid impulses w/o bruits Thyroid- normal to palpation CHEST: Clear to P&A, normal percussion  HEART: RRR, no m/g/r heard ABDOMEN: Soft and nl; nml bowel sounds; no organomegaly or masses noted. Obese ZOX:WRUE, nl pulses, no edema  NEURO: Grossly intact to observation      Impression & Recommendations:  Problem # 1:  SKIN RASH, ALLERGIC (ICD-692.9)  This is likely an allergic rash due to drug allergy to sulfa. It can continue this long after cessation of offending drug. Can't exclude possibility she is also reacting to cephalosporin, given hx of PCN allergy. We discussed options. Since she has already been taking both benadryl and zyrtec, we agreed to accept impact on diabetes and give her a depomedrol inj. She can use Avveno and try change to Allegra 180. We will get basic lab, including D-dimer intended by Dr Sherene Sires at last OV byut missed by lab. Marland Kitchen Her lack of atopic response based on labs in late October challenges impression of an allergic response, though it may be IgG mediated, rather than IgE. Thius could also be residual viral exanthem or just dry skin.  Her updated medication list for this problem includes:    Zyrtec  Allergy 10 Mg Tabs (Cetirizine hcl) ..... By mouth daily as needed itchy sneezy runny nose    Complete Allergy Medicine 25 Mg Tabs (Diphenhydramine hcl) .Marland Kitchen... Take 1 by mouth once daily as needed allergies  Medications Added to Medication List This Visit: 1)  Tylenol Extra Strength 500 Mg Tabs (Acetaminophen) .... Take 2  capsules eveyr 8 hours as needed 2)  Pepcid 20 Mg Tabs (Famotidine) .... Take 1 by mouth two times a day x 7days as needed allergic reaction 3)  Vistaril 25 Mg Caps (Hydroxyzine pamoate) .... Take 1 by mouth every 6hours as needed itching/rash 4)  Complete Allergy Medicine 25 Mg Tabs (Diphenhydramine hcl) .... Take 1 by mouth once daily as needed allergies 5)  Trazodone Hcl 100 Mg Tabs (Trazodone hcl) .... Take 1 by mouth at bedtime as needed insomnia 6)  Fluticasone Propionate 50 Mcg/act Susp (Fluticasone propionate) .Marland Kitchen.. 1-2 sprays in each nostril once daily as needed 7)  Norco 5-325 Mg Tabs (Hydrocodone-acetaminophen) .... Take 1-2 by mouth every 4-6 hours as needed pain 8)  Keflex 500 Mg Caps (Cephalexin) .... Take 1 by mouth four times a day with food for uti 9)  Cheratussin Ac 100-10 Mg/30ml Syrp (Guaifenesin-codeine) .Marland Kitchen.. 1 teaspoonful every 4-6 hours as needed cough 10)  Advil 200 Mg Tabs (Ibuprofen) .... Take 1-2 by mouth once daily as needed  Other Orders: T-D-Dimer Fibrin Derivatives Quantitive [id number]) Consultation Level IV (47829) T-IgE (Immunoglobulin E) ([id number]) Depo- Medrol 80mg  (J1040) Admin of Therapeutic Inj  intramuscular or subcutaneous (84696) TLB-CBC Platelet - w/Differential (85025-CBCD)  Patient Instructions: 1)  Please schedule a follow-up appointment as needed. Follow up with Dr Sherene Sires for pulmonary and Dr Lodema Hong for your primary care and your urinary infection 2)  Depo 80 3)  lab 4)  Try Allegra 180 as a different antihistamine for itching if needed 5)  Try Aveeno for soothing your skin.  Medication Administration  Injection #  1:    Medication: Depo- Medrol 80mg     Diagnosis: SKIN RASH, ALLERGIC (ICD-692.9)    Route: IM    Site: RUOQ gluteus    Exp Date: 09/2012    Lot #: [id number]    Mfr: Pharmacia    Patient tolerated injection without complications    Given by: Zackery Barefoot CMA (February 15, 2010 12:01 PM)  Orders Added: 1)  T-D-Dimer Fibrin Derivatives Quantitive [[id number]] 2)  Consultation Level IV [38756] 3)  T-IgE (Immunoglobulin E) [[id number]] 4)  Depo- Medrol 80mg  [J1040] 5)  Admin of Therapeutic Inj  intramuscular or subcutaneous [96372] 6)  TLB-CBC Platelet - w/Differential [[id number]]

## 2010-05-16 NOTE — Letter (Signed)
Summary: Generic Letter  Architect[facility] technologist at [facility]  [address] [place] [address]   Phone: [phone]  Fax: [phone]        December 28, 2009 MRN: [id number]    TARIA CASTRILLO [address] [place], [place]  [address]    Dear Ms. Peatross,        This is the information, I promised to send you on smoking   cessation.  I hope it will be helpful in your efforts to quit smoking.     Sincerely, Teressa Lower RN  This letter has been electronically signed by your physician.

## 2010-05-16 NOTE — Letter (Signed)
Summary: Appointment - Missed  Architectural technologist at [facility]. [address]   [place], [place] [address]   Phone: [phone]  Fax: [phone]     June 29, 2009 MRN: [id number]   [name] [address] [place], [place]  [address]   Dear [name],  Our records indicate you missed your appointment on 06/29/09 Joni Reining                               It is very important that we reach you to reschedule this appointment. We look forward to participating in your health care needs. Please contact us at the number listed above at your earliest convenience to reschedule this appointment.     Sincerely,    Glass blower/designer

## 2010-05-16 NOTE — Progress Notes (Signed)
Summary: note  Phone Note Call from Patient   Summary of Call: was given a note to be out of work or school till yesterday and now she needs a note for the whole week  she has felt bad Initial call taken by: Lind Guest,  June 10, 2009 11:28 AM  Follow-up for Phone Call        New note done. Follow-up by: Esperanza Sheets PA,  June 10, 2009 11:42 AM  Additional Follow-up for Phone Call Additional follow up Details #1::        patient aware Additional Follow-up by: Adella Hare LPN,  June 10, 2009 1:03 PM

## 2010-05-16 NOTE — Letter (Signed)
Summary: Work Writer at [facility]  [address] [place] [address]   Phone: [phone]  Fax: [phone]     December 12, 2009    [facility]   The above named patient had a medical visit today at:      am / pm.  Please take this into consideration when reviewing the time away from work/school.      Sincerely yours,  Architectural technologist

## 2010-05-16 NOTE — Letter (Signed)
Summary: Work Excuse  [facility]  [address]   [place], [place] [address]   Phone: [phone]  Fax: [phone]    Today's Date: December 12, 2009  Name of Patient: [name]  The above named patient had a medical visit today.  Please take this into consideration when reviewing the time away from work/school.    Special Instructions:  [* ] None  [  ] To be off the remainder of today, returning to the normal work / school schedule tomorrow.  [  ] To be off until the next scheduled appointment on ______________________.  [  ] Other ________________________________________________________________ ________________________________________________________________________   Sincerely yours,   Esperanza Sheets, PA

## 2010-05-16 NOTE — Assessment & Plan Note (Signed)
Summary: multiple complaints- room 1   Vital Signs:  Patient profile:   45 year old female Menstrual status:  irregular Height:      64 inches Weight:      187.44 pounds BMI:     32.29 O2 Sat:      96 % on Room air Temp:     99.5 degrees F Pulse rate:   92 / minute Resp:     16 per minute BP sitting:   124 / 80  (left arm)  Vitals Entered By: Adella Hare LPN (November 16, 2009 2:25 PM) CC: multiple comlpaints: nausea, abdominal pain, right leg pain, trouble breathing Is Patient Diabetic? Yes   Referring Provider:  [facility]  Primary Provider:  Dr. Syliva Overman  CC:  multiple comlpaints: nausea, abdominal pain, right leg pain, and trouble breathing.  History of Present Illness: Pt presents today with multiple concerns.  c/o Lt groin pain x couple days, sharp, intermittent.  No associtated with movement of leg, wt bearing etc.  No trauma.  Nauseated since last night.  Appetitie decreased.  No vomiting.  No diarrhea.  No fever.  Pain Rt lower leg from knee to ankle x 3 days. Intermittent pain.  No associated with wt bearing or movement.  Pt states she was seen at ER about 3 mos ago with pain in both LE.  Pt states she was told that it might be because of her diabetes.  Pt states she has a hx of anemia.  Had missed a2 weeks of iron pills and wants her blood checked.  She has been back on her iron pills x 2 days. Pt did have CBC done in June.  c/o intermittent difficulty breathing.  States she feels like she has fluid that comes in into her chest.  Hx of GERD.  STates is taking her Omeprazole two times a day.   Current Medications (verified): 1)  Divalproex Sodium 500 Mg Tbec (Divalproex Sodium) .... 2 Tabs At Bedtime 2)  Atenolol 25 Mg  Tabs (Atenolol) .... Take 1 Tablet By Mouth Once A Day 3)  Ferrous Sulfate 325 (65 Fe) Mg  Tabs (Ferrous Sulfate) .... Take 1 Tablet By Mouth Two Times A Day 4)  Lexapro 20 Mg Tabs (Escitalopram Oxalate) .Marland Kitchen.. 1 Tablet By Mouth in  Mornings 5)  Hydrochlorothiazide 25 Mg  Tabs (Hydrochlorothiazide) .... One Tab By Mouth Once Daily 6)  Loratadine 10 Mg  Tabs (Loratadine) .... One Tab By Mouth Once Daily 7)  Metformin Hcl 1000 Mg Tabs (Metformin Hcl) .... Take 1 Tablet By Mouth Two Times A Day 8)  Lovastatin 20 Mg Tabs (Lovastatin) .... 2 Tablets By Mouth At Bedtime 9)  Nitrostat 0.4 Mg Subl (Nitroglycerin) .... Sublingualy Every 5 Min As Needed, Max 3 Tabs in 5 Mn. If No Improvement Call 911 10)  Klor-Con M20 20 Meq Cr-Tabs (Potassium Chloride Crys Cr) .... Take 1 Tablet By Mouth Two Times A Day 11)  Omeprazole 20 Mg Tbec (Omeprazole) .... Two Times A Day 12)  Accu Chek Aviva Strips and Lancets .... For Once Daily Testing 13)  Fish Oil 300 Mg Caps (Omega-3 Fatty Acids) .... Take 1 Tablet By Mouth Once A Day 14)  Meclizine Hcl 12.5 Mg Tabs (Meclizine Hcl) .... Take 1 Tablet By Mouth Three Times A Day As Needed For Vertigo/dizziness 15)  Lorazepam 2 Mg Tabs (Lorazepam) .... One Half To One Tab By Mouth At Bedtime  Allergies (verified): 1)  ! Pcn  Past History:  Past medical history reviewed for relevance to current acute and chronic problems.  Past Medical History: Reviewed history from 09/27/2009 and no changes required. ASCVD: Minimal at cath in 09/2005; negative pharm. stress nuclear study in 8/08 with nl EF; neg. stress echo'-10 AODM-no insulin; onset 2000 HYPERLIPIDEMIA (ICD-272.4) HYPERTENSION (ICD-401.9) Tobacco abuse: 30 pack years Gastroesophageal reflux disease; Schatzki's ring Iron deficiency anemia                                                                                                                                                   DEPRESSION (ICD-311) ALCOHOL ABUSE (ICD-305.00) OBESITY (ICD-278.00)  Review of Systems General:  Complains of fatigue and loss of appetite; denies chills and fever. ENT:  Denies earache, nasal congestion, sinus pressure, and sore throat. CV:  Denies bluish  discoloration of lips or nails and palpitations. Resp:  Denies cough and shortness of breath. GI:  Complains of loss of appetite and nausea; denies abdominal pain, change in bowel habits, and vomiting. GU:  Denies dysuria, hematuria, and urinary frequency. MS:  Complains of muscle aches and muscle weakness; denies joint pain, joint swelling, low back pain, and mid back pain. Neuro:  Denies headaches, numbness, and tingling.  Physical Exam  General:  Well-developed,well-nourished,in no acute distress; alert,appropriate and cooperative throughout examination Head:  Normocephalic and atraumatic without obvious abnormalities. No apparent alopecia or balding. Ears:  External ear exam shows no significant lesions or deformities.  Otoscopic examination reveals clear canals, tympanic membranes are intact bilaterally without bulging, retraction, inflammation or discharge. Hearing is grossly normal bilaterally. Nose:  External nasal examination shows no deformity or inflammation. Nasal mucosa are pink and moist without lesions or exudates. Mouth:  Oral mucosa and oropharynx without lesions or exudates.  Teeth in good repair. Neck:  No deformities, masses, or tenderness noted. Lungs:  Normal respiratory effort, chest expands symmetrically. Lungs are clear to auscultation, no crackles or wheezes. Heart:  Normal rate and regular rhythm. S1 and S2 normal without gallop, murmur, click, rub or other extra sounds. Abdomen:  soft and non-tender.   Msk:  RLE:  FROM, No joint tenderness or swelling.  Nontender to palp soft tissues.  Lt Groin:  TTP flexor tendon.  Pain reproduced with resistance to internal rotation. Pulses:  R posterior tibial normal, R dorsalis pedis normal, L posterior tibial normal, and L dorsalis pedis normal.   Extremities:  No PTE.  Neurologic:  alert & oriented X3, sensation intact to light touch, gait normal, and DTRs symmetrical and normal.   Skin:  Intact without suspicious lesions or  rashes Cervical Nodes:  No lymphadenopathy noted Psych:  Cognition and judgment appear intact. Alert and cooperative with normal attention span and concentration. No apparent delusions, illusions, hallucinations   Impression & Recommendations:  Problem # 1:  GASTROESOPHAGEAL REFLUX DISEASE (ICD-530.81) Assessment Deteriorated  Her updated medication  list for this problem includes:    Omeprazole 20 Mg Tbec (Omeprazole) .Marland Kitchen..Marland Kitchen Two times a day  Problem # 2:  NAUSEA (ICD-787.02) Assessment: New  Her updated medication list for this problem includes:    Loratadine 10 Mg Tabs (Loratadine) ..... One tab by mouth once daily    Meclizine Hcl 12.5 Mg Tabs (Meclizine hcl) .Marland Kitchen... Take 1 tablet by mouth three times a day as needed for vertigo/dizziness  Orders: T-Basic Metabolic Panel [id number]) Zofran 1mg . injection (U9811) Admin of Therapeutic Inj  intramuscular or subcutaneous (91478)  Problem # 3:  LEG PAIN, RIGHT (ICD-729.5) Assessment: New  Orders: Depo- Medrol 80mg  (J1040)  Problem # 4:  INGUINAL PAIN, LEFT (ICD-789.09) Assessment: New  Her updated medication list for this problem includes:    Tramadol Hcl 50 Mg Tabs (Tramadol hcl) .Marland Kitchen... Take 1 every 6 hrs as needed for pain  Complete Medication List: 1)  Divalproex Sodium 500 Mg Tbec (Divalproex sodium) .... 2 tabs at bedtime 2)  Atenolol 25 Mg Tabs (Atenolol) .... Take 1 tablet by mouth once a day 3)  Ferrous Sulfate 325 (65 Fe) Mg Tabs (Ferrous sulfate) .... Take 1 tablet by mouth two times a day 4)  Lexapro 20 Mg Tabs (Escitalopram oxalate) .Marland Kitchen.. 1 tablet by mouth in mornings 5)  Hydrochlorothiazide 25 Mg Tabs (Hydrochlorothiazide) .... One tab by mouth once daily 6)  Loratadine 10 Mg Tabs (Loratadine) .... One tab by mouth once daily 7)  Metformin Hcl 1000 Mg Tabs (Metformin hcl) .... Take 1 tablet by mouth two times a day 8)  Lovastatin 20 Mg Tabs (Lovastatin) .... 2 tablets by mouth at bedtime 9)  Nitrostat 0.4 Mg Subl  (Nitroglycerin) .... Sublingualy every 5 min as needed, max 3 tabs in 5 mn. if no improvement call 911 10)  Klor-con M20 20 Meq Cr-tabs (Potassium chloride crys cr) .... Take 1 tablet by mouth two times a day 11)  Omeprazole 20 Mg Tbec (Omeprazole) .... Two times a day 12)  Accu Chek Aviva Strips and Lancets  .... For once daily testing 13)  Fish Oil 300 Mg Caps (Omega-3 fatty acids) .... Take 1 tablet by mouth once a day 14)  Meclizine Hcl 12.5 Mg Tabs (Meclizine hcl) .... Take 1 tablet by mouth three times a day as needed for vertigo/dizziness 15)  Lorazepam 2 Mg Tabs (Lorazepam) .... One half to one tab by mouth at bedtime 16)  Tramadol Hcl 50 Mg Tabs (Tramadol hcl) .... Take 1 every 6 hrs as needed for pain  Other Orders: T-CBC w/Diff ([id number])  Patient Instructions: 1)  Keep your next appt with Dr Lodema Hong. 2)  Have blood work drawn today. 3)  I have prescribed Tramadol for pain. 4)  Continue Omeprazole as prescribed. 5)  Discontinue Ibuprofen. 6)  You have received shots for nausea, and leg pain. Prescriptions: TRAMADOL HCL 50 MG TABS (TRAMADOL HCL) take 1 every 6 hrs as needed for pain  #30 x 0   Entered and Authorized by:   Esperanza Sheets PA   Signed by:   Esperanza Sheets PA on 11/16/2009   Method used:   Electronically to        [facility]  [address] (retail)       [address]       [place], [place]  [address]       Ph: [phone]       Fax: [phone]   RxID:  [id number]    Medication Administration  Injection # 1:    Medication: Depo- Medrol 80mg     Diagnosis: LEG PAIN, RIGHT (ICD-729.5)    Route: IM    Site: RUOQ gluteus    Exp Date: 4/12    Lot #: [id number]    Mfr: 4/12    Patient tolerated injection without complications    Given by: Adella Hare LPN (November 16, 2009 3:22 PM)  Injection # 2:    Medication: Zofran 1mg . injection    Diagnosis: NAUSEA (ICD-787.02)    Route: IM    Site: LUOQ gluteus    Exp Date: 10/12    Lot #:  [id number]    Mfr: [email or web address]    Comments: zofran 4mg  given    Patient tolerated injection without complications    Given by: Adella Hare LPN (November 16, 2009 3:22 PM)  Orders Added: 1)  T-CBC w/Diff [[id number]] 2)  T-Basic Metabolic Panel [[id number]] 3)  Depo- Medrol 80mg  [J1040] 4)  Zofran 1mg . injection [J2405] 5)  Admin of Therapeutic Inj  intramuscular or subcutaneous [96372] 6)  Est. Patient Level IV [60109]

## 2010-05-16 NOTE — Assessment & Plan Note (Signed)
Summary: melena and dizziness- room 2   Vital Signs:  Patient profile:   45 year old female Menstrual status:  irregular Height:      64 inches Weight:      185 pounds O2 Sat:      99 % on Room air Pulse (ortho):   98 / minute Resp:     16 per minute BP standing:   102 / 80  Vitals Entered By: Adella Hare LPN (December 12, 2009 3:18 PM) CC: blood in stool and some dizziness Is Patient Diabetic? No Pain Assessment Patient in pain? no      Comments did not bring meds to ov   Serial Vital Signs/Assessments:  Time      Position  BP       Pulse  Resp  Temp     By           Lying LA  110/80   81                    Adella Hare LPN           Sitting   120/88   88                    Adella Hare LPN           Standing  102/80   98                    Adella Hare LPN   Referring Provider:  [facility]  Primary Provider:  Dr. Syliva Overman  CC:  blood in stool and some dizziness.  History of Present Illness: Pt presents today with c/o intermittent dizziness.  No vertigo. Meclizine helps when takes it.  Had been to ER in past for this adn saw DrSimpson in July 2011. Wearing heart monitor today.  Seeing cardiology for palpitations.  Pt states the dizziness does sometimes occur with her palpitations.  yesterday had 1 BM with bright red blood. Blood was on tissue with wiping and in water.  No abd pain.  No constipation or diarrhea.  2nd BM yesterday neg.  Has been taking 2 over the counter supplements.  One is to help with wt loss.  States dizziness started before she started taking this.  Hx of DM.  Blood sugars 110-120 fasting.  Sometimes in 80's.  no episodes of hypoglycemia. Taking meds as prescribed.  At end of visit, after exam etc completed, pt mentions that she is having a vag odor.  No dischg.  Has not been sexually active x 3-4 mos.  Neg cultures done in July.  Odor started afterwards.   Allergies (verified): 1)  ! Pcn  Past History:  Past medical history reviewed  for relevance to current acute and chronic problems.  Past Medical History: Reviewed history from 09/27/2009 and no changes required. ASCVD: Minimal at cath in 09/2005; negative pharm. stress nuclear study in 8/08 with nl EF; neg. stress echo'-10 AODM-no insulin; onset 2000 HYPERLIPIDEMIA (ICD-272.4) HYPERTENSION (ICD-401.9) Tobacco abuse: 30 pack years Gastroesophageal reflux disease; Schatzki's ring Iron deficiency anemia  DEPRESSION (ICD-311) ALCOHOL ABUSE (ICD-305.00) OBESITY (ICD-278.00)  Review of Systems General:  Complains of fatigue and weakness; denies chills, fever, and loss of appetite. Eyes:  Denies blurring and double vision. ENT:  Denies decreased hearing and ringing in ears. CV:  Complains of chest pain or discomfort and palpitations. Resp:  Denies shortness of breath. GI:  Complains of bloody stools; denies abdominal pain, change in bowel habits, indigestion, nausea, and vomiting. Neuro:  Denies headaches. Psych:  Denies anxiety and depression.  Physical Exam  General:  Well-developed,well-nourished,in no acute distress; alert,appropriate and cooperative throughout examination Head:  Normocephalic and atraumatic without obvious abnormalities. No apparent alopecia or balding. Eyes:  pupils equal, pupils round, and pupils reactive to light.   Ears:  External ear exam shows no significant lesions or deformities.  Otoscopic examination reveals clear canals, tympanic membranes are intact bilaterally without bulging, retraction, inflammation or discharge. Hearing is grossly normal bilaterally. Nose:  External nasal examination shows no deformity or inflammation. Nasal mucosa are pink and moist without lesions or exudates. Mouth:  Oral mucosa and oropharynx without lesions or exudates.  Teeth in good repair. Neck:  No deformities,  masses, or tenderness noted. Lungs:  Normal respiratory effort, chest expands symmetrically. Lungs are clear to auscultation, no crackles or wheezes. Heart:  Normal rate and regular rhythm. S1 and S2 normal without gallop, murmur, click, rub or other extra sounds. Abdomen:  Bowel sounds positive,abdomen soft and non-tender without masses, organomegaly or hernias noted. Rectal:  no external abnormalities, normal sphincter tone, and internal hemorrhoid(s).  Anoscope exam reveals one small internal hemorrhoid approx 5:00 position Cervical Nodes:  No lymphadenopathy noted Psych:  Cognition and judgment appear intact. Alert and cooperative with normal attention span and concentration. No apparent delusions, illusions, hallucinations   Impression & Recommendations:  Problem # 1:  INTERMITTENT VERTIGO (ICD-780.4) Assessment Unchanged Discussed with pt to f/u with cardiologist as planned to r/o cardiac causes for intermittent dizziness.  If neg, may then need further work up to r/o other causes.  Her updated medication list for this problem includes:    Loratadine 10 Mg Tabs (Loratadine) ..... One tab by mouth once daily    Meclizine Hcl 12.5 Mg Tabs (Meclizine hcl) .Marland Kitchen... Take 1 tablet by mouth three times a day as needed for vertigo/dizziness    Promethazine Hcl 12.5 Mg Tabs (Promethazine hcl) .Marland Kitchen... Take 1 every 8 hrs as needed for nausea  Problem # 2:  HEMORRHOIDS, INTERNAL (ICD-455.0) Assessment: New  Orders: Anoscopy (84166)  Problem # 3:  DIABETES MELLITUS, TYPE II (ICD-250.00) Assessment: Comment Only  Her updated medication list for this problem includes:    Metformin Hcl 1000 Mg Tabs (Metformin hcl) .Marland Kitchen... Take 1 tablet by mouth two times a day  Labs Reviewed: Creat: 0.78 (11/16/2009)    Reviewed HgBA1c results: 7.2 (09/28/2009)  7.2 (09/28/2009)  Problem # 4:  HYPERTENSION (ICD-401.9)  Her updated medication list for this problem includes:    Atenolol 25 Mg Tabs (Atenolol)  .Marland Kitchen... Take 1 tablet by mouth once a day    Hydrochlorothiazide 25 Mg Tabs (Hydrochlorothiazide) ..... One tab by mouth once daily  BP today: 120/88 Prior BP: 140/90 (12/06/2009)  Labs Reviewed: K+: 4.3 (11/16/2009) Creat: : 0.78 (11/16/2009)   Chol: 156 (09/28/2009)   HDL: 28 (09/28/2009)   LDL: 61 (09/28/2009)   TG: 335 (09/28/2009)  Problem # 5:  VAGINITIS (ICD-616.10) Assessment: New  Her updated medication list for this problem includes:    Metronidazole 500 Mg Tabs (Metronidazole) .Marland KitchenMarland KitchenMarland KitchenMarland Kitchen  Take 1 two times a day x 7 days  Complete Medication List: 1)  Divalproex Sodium 500 Mg Tbec (Divalproex sodium) .... 2 tabs at bedtime 2)  Atenolol 25 Mg Tabs (Atenolol) .... Take 1 tablet by mouth once a day 3)  Ferrous Sulfate 325 (65 Fe) Mg Tabs (Ferrous sulfate) .... Take 1 tablet by mouth two times a day 4)  Lexapro 20 Mg Tabs (Escitalopram oxalate) .Marland Kitchen.. 1 tablet by mouth in mornings 5)  Hydrochlorothiazide 25 Mg Tabs (Hydrochlorothiazide) .... One tab by mouth once daily 6)  Loratadine 10 Mg Tabs (Loratadine) .... One tab by mouth once daily 7)  Metformin Hcl 1000 Mg Tabs (Metformin hcl) .... Take 1 tablet by mouth two times a day 8)  Lovastatin 20 Mg Tabs (Lovastatin) .... 2 tablets by mouth at bedtime 9)  Nitrostat 0.4 Mg Subl (Nitroglycerin) .... Sublingualy every 5 min as needed, max 3 tabs in 5 mn. if no improvement call 911 10)  Klor-con M20 20 Meq Cr-tabs (Potassium chloride crys cr) .... Take 1 tablet by mouth two times a day 11)  Omeprazole 20 Mg Tbec (Omeprazole) .... Two times a day 12)  Accu Chek Aviva Strips and Lancets  .... For once daily testing 13)  Fish Oil 300 Mg Caps (Omega-3 fatty acids) .... Take 1 tablet by mouth once a day 14)  Meclizine Hcl 12.5 Mg Tabs (Meclizine hcl) .... Take 1 tablet by mouth three times a day as needed for vertigo/dizziness 15)  Lorazepam 2 Mg Tabs (Lorazepam) .... One half to one tab by mouth at bedtime 16)  Tramadol Hcl 50 Mg Tabs (Tramadol  hcl) .... Take 1 every 6 hrs as needed for pain 17)  Flonase 50 Mcg/act Susp (Fluticasone propionate) .... 2 puffs per nostril daily 18)  Tylenol Arthritis Pain 650 Mg Cr-tabs (Acetaminophen) .... One twice daily for 5 days 19)  Promethazine Hcl 12.5 Mg Tabs (Promethazine hcl) .... Take 1 every 8 hrs as needed for nausea 20)  Metronidazole 500 Mg Tabs (Metronidazole) .... Take 1 two times a day x 7 days 21)  Hemorrhoidal-hc 25 Mg Supp (Hydrocortisone acetate) .... Insert 1 rectally two times a day x 7 days  Other Orders: Hemoccult Guaiac-1 spec.(in office) ([id number])  Patient Instructions: 1)  Schedule follow up appt with Dr Lodema Hong after Sept 18th. 2)  I have prescribed an antibiotic for you vaginal infection and rectal suppository for your hemorrhoid. 3)  Continue follow up with Dr Jena Gauss and Dr Dietrich Pates. 4)  Use Meclizine as needed for dizziness. Prescriptions: HEMORRHOIDAL-HC 25 MG SUPP (HYDROCORTISONE ACETATE) insert 1 rectally two times a day x 7 days  #14 x 0   Entered and Authorized by:   Esperanza Sheets PA   Signed by:   Esperanza Sheets PA on 12/12/2009   Method used:   Electronically to        [facility]  [address] (retail)       [address]       [place], [place]  [address]       Ph: [phone]       Fax: [phone]   RxID:   [id number] METRONIDAZOLE 500 MG TABS (METRONIDAZOLE) take 1 two times a day x 7 days  #14 x 0   Entered and Authorized by:   Esperanza Sheets PA   Signed by:   Esperanza Sheets PA on 12/12/2009   Method used:   Electronically to  [facility]  [place] [address] (retail)       [address]       [place], [place]  [address]       Ph: [phone]       Fax: [phone]   RxID:   [id number]   Laboratory Results    Stool - Occult Blood Hemmoccult #1: negative Date: 12/12/2009

## 2010-05-16 NOTE — Assessment & Plan Note (Signed)
Summary: office visit   Vital Signs:  Patient profile:   45 year old female Menstrual status:  irregular Height:      64 inches Weight:      187 pounds BMI:     32.21 O2 Sat:      95 % on Room air Pulse rate:   94 / minute Pulse rhythm:   regular Resp:     16 per minute BP sitting:   100 / 64  (right arm)  Vitals Entered By: Mauricia Area CMA (February 27, 2010 12:52 PM)  Nutrition Counseling: Patient's BMI is greater than 25 and therefore counseled on weight management options.  O2 Flow:  Room air CC: wrist, hand, feet, ankles, leg and arm aching since Friday.  Comments Did not bring meds   Primary Care Provider:  Dr. Syliva Overman  CC:  wrist, hand, feet, ankles, and leg and arm aching since Friday. Marland Kitchen  History of Present Illness: Pt reportsd that she was recently in the ED  was told she had blood in her urine, but has bleeding for 2 months, and this is being treated by gynae. Seeing drm this wek forrash on feet and on her back has used a ceam with  no result c/o genraliseed joint pains affecting wrist,hand , feet and ankles  Allergies: 1)  ! Pcn 2)  ! Flagyl 3)  ! Sulfa 4)  ! Bactrim  Review of Systems      See HPI General:  Complains of fatigue. Eyes:  Denies blurring and discharge. ENT:  Denies hoarseness and nasal congestion. CV:  Denies chest pain or discomfort, palpitations, and swelling of feet. Resp:  Denies cough and sputum productive. GI:  Denies abdominal pain, constipation, diarrhea, nausea, and vomiting. GU:  Denies dysuria and urinary frequency. MS:  Complains of joint pain; generalised joint pains questios about artritis, sen i ED yesterday. Derm:  Complains of itching, lesion(s), and rash; on abomen and on feet, also cracked feet . Neuro:  Denies headaches and seizures. Psych:  Complains of anxiety, depression, irritability, and mental problems; denies suicidal thoughts/plans, thoughts of violence, and unusual visions or sounds. Endo:  Denies  excessive thirst and excessive urination; blood sugars have been averaging ove 300, states this past week down to 100. Heme:  Denies abnormal bruising and bleeding. Allergy:  Complains of seasonal allergies; denies hives or rash and itching eyes.  Physical Exam  General:  Well-developed,adequately,in no acute distress; alert,appropriate and cooperative throughout examination, anxious. HEENT: No facial asymmetry,  EOMI, No sinus tenderness, TM's Clear, oropharynx  pink and moist.   Chest: Clear to auscultation bilaterally. No reproducible chest wall tenderness CVS: S1, S2, No murmurs, No S3.   Abd: Soft, Nontender.  MS: Adequate ROM spine, hips, shoulders and knees. tender on palpation of multiple joints Ext: No edema.   CNS: CN 2-12 intact, power tone and sensation normal throughout.   Skin: Intact, no visible lesions or rashes.  Psych: Good eye contact, normal affect.  Memory intact     Impression & Recommendations:  Problem # 1:  PAIN IN JOINT, MULTIPLE SITES (ICD-719.49) Assessment Deteriorated  Orders: Sed Rate (ESR)-[facility] [phone]) T-Rheumatoid Factor [id number]) T-DNA Antibody ([id number]) Rheumatology Referral (Rheumatology)  Problem # 2:  HYPERTENSION (ICD-401.9) Assessment: Unchanged  Her updated medication list for this problem includes:    Metoprolol Tartrate 50 Mg Tabs (Metoprolol tartrate) .Marland Kitchen... Take 1 tablet by mouth two times a day    Hydrochlorothiazide 25 Mg Tabs (Hydrochlorothiazide) .Marland Kitchen... Take 1 tablet  by mouth once a day  BP today: 100/64 Prior BP: 130/86 (02/15/2010)  Labs Reviewed: K+: 5.8 (02/10/2010) Creat: : 1.0 (02/10/2010)   Chol: 156 (09/28/2009)   HDL: 28 (09/28/2009)   LDL: 61 (09/28/2009)   TG: 335 (09/28/2009)  Problem # 3:  HYPERLIPIDEMIA (ICD-272.4) Assessment: Comment Only  Her updated medication list for this problem includes:    Lovastatin 20 Mg Tabs (Lovastatin) .Marland Kitchen... 2 tablets by mouth at bedtime  Labs Reviewed: SGOT: 8  (09/28/2009)   SGPT: 8 (09/28/2009)   HDL:28 (09/28/2009), 28 (09/28/2009)  LDL:61 (09/28/2009), 61 (09/28/2009)  Chol:156 (09/28/2009), 156 (09/28/2009)  Trig:335 (09/28/2009), 335 (09/28/2009) Low fat dietdiscussed and encouraged triglycerides are excessively high  Problem # 4:  DIABETES MELLITUS, TYPE II (ICD-250.00) Assessment: Comment Only  Labs Reviewed: Creat: 1.0 (02/10/2010)    Reviewed HgBA1c results: 7.2 (09/28/2009)  7.2 (09/28/2009)  Complete Medication List: 1)  Metformin Hcl 1000 Mg Tabs (Metformin hcl) .... Take 1 tablet by mouth two times a day 2)  Lovastatin 20 Mg Tabs (Lovastatin) .... 2 tablets by mouth at bedtime 3)  Nitrostat 0.4 Mg Subl (Nitroglycerin) .... Sublingualy every 5 min as needed, max 3 tabs in 5 mn. if no improvement call 911 4)  Ferrous Sulfate 325 (65 Fe) Mg Tabs (Ferrous sulfate) .... Take 1 tablet by mouth two times a day 5)  Klor-con M20 20 Meq Cr-tabs (Potassium chloride crys cr) .... Take 1 tablet by mouth once a day 6)  Metoprolol Tartrate 50 Mg Tabs (Metoprolol tartrate) .... Take 1 tablet by mouth two times a day 7)  Divalproex Sodium 500 Mg Tbec (Divalproex sodium) .... Take 2 tablets by mouth at bedtime 8)  Lexapro 20 Mg Tabs (Escitalopram oxalate) .... Take 1 tablet by mouth once a day 9)  Hydrochlorothiazide 25 Mg Tabs (Hydrochlorothiazide) .... Take 1 tablet by mouth once a day 10)  Zyrtec Allergy 10 Mg Tabs (Cetirizine hcl) .... By mouth daily as needed itchy sneezy runny nose 11)  Strips and Lancets For True Result Meter  .... Three times a day testing 12)  Glimepiride 2 Mg Tabs (Glimepiride) .... Take 1 tablet by mouth two times a day 13)  Clotrimazole 1 % Crea (Clotrimazole) .... Aqpply twice daily to feet for rash and cracking 14)  Nexium 40 Mg Cpdr (Esomeprazole magnesium) .Marland Kitchen.. 1 by mouth daily for acid reflux 15)  Tylenol Extra Strength 500 Mg Tabs (Acetaminophen) .... Take 2  capsules eveyr 8 hours as needed 16)  Pepcid 20 Mg Tabs  (Famotidine) .... Take 1 by mouth two times a day x 7days as needed allergic reaction 17)  Vistaril 25 Mg Caps (Hydroxyzine pamoate) .... Take 1 by mouth every 6hours as needed itching/rash 18)  Complete Allergy Medicine 25 Mg Tabs (Diphenhydramine hcl) .... Take 1 by mouth once daily as needed allergies 19)  Trazodone Hcl 100 Mg Tabs (Trazodone hcl) .... Take 1 by mouth at bedtime as needed insomnia 20)  Fluticasone Propionate 50 Mcg/act Susp (Fluticasone propionate) .Marland Kitchen.. 1-2 sprays in each nostril once daily as needed 21)  Norco 5-325 Mg Tabs (Hydrocodone-acetaminophen) .... Take 1-2 by mouth every 4-6 hours as needed pain 22)  Keflex 500 Mg Caps (Cephalexin) .... Take 1 by mouth four times a day with food for uti 23)  Cheratussin Ac 100-10 Mg/52ml Syrp (Guaifenesin-codeine) .Marland Kitchen.. 1 teaspoonful every 4-6 hours as needed cough 24)  Advil 200 Mg Tabs (Ibuprofen) .... Take 1-2 by mouth once daily as needed  Other Orders: T- Hemoglobin  A1C ([id number])  Patient Instructions: 1)  Please schedule a follow-up appointment in 3 months. 2)  It is important that you exercise regularly at least 20 minutes 5 times a week. If you develop chest pain, have severe difficulty breathing, or feel very tired , stop exercising immediately and seek medical attention. 3)  You need to lose weight. Consider a lower calorie diet and regular exercise.  4)  Check your blood sugars regularly. If your readings are usually above : or below 70 you should contact our office. 5)  HBA1C , ESR, rheumatoid factor and antibodies to double stranded DNA  today   Orders Added: 1)  Est. Patient Level IV [99214] 2)  T- Hemoglobin A1C [[id number]] 3)  Sed Rate (ESR)-[facility] [85651] 4)  T-Rheumatoid Factor [[id number]] 5)  T-DNA Antibody [[id number]] 6)  Rheumatology Referral [Rheumatology]

## 2010-05-16 NOTE — Assessment & Plan Note (Signed)
Summary: F UP   Vital Signs:  Patient profile:   45 year old female Menstrual status:  irregular Height:      64 inches Weight:      190.25 pounds BMI:     32.77 O2 Sat:      98 % Pulse rate:   83 / minute Pulse rhythm:   regular Resp:     16 per minute BP sitting:   120 / 78  (left arm) Cuff size:   large  Vitals Entered By: Everitt Amber LPN (September 23, 1306 3:21 PM)  Nutrition Counseling: Patient's BMI is greater than 25 and therefore counseled on weight management options. CC: Wants full check up and labs (liver, kidneys and bladder), one day last week she was unable to urinate for the whole day   Primary Care Provider:  Lodema Hong  CC:  Wants full check up and labs (liver, kidneys and bladder), and one day last week she was unable to urinate for the whole day.  History of Present Illness: pt reports that  she has been doing fairly well. She denies any recent fever or chiolls, and has no current symptoms suggestive of infection. she still smokes, and unfortunatelyit appears that her alcohol use is increased. She c/o intermittent chest discomfort with no aggravating factrs, and no other symptoms suggestive of CVD    Current Medications (verified): 1)  Divalproex Sodium 500 Mg Tbec (Divalproex Sodium) .... 2 Tabs At Bedtime 2)  Atenolol 25 Mg  Tabs (Atenolol) .... Take 1 Tablet By Mouth Once A Day 3)  Ferrous Sulfate 325 (65 Fe) Mg  Tabs (Ferrous Sulfate) .... Take 1 Tablet By Mouth Two Times A Day 4)  Geodon 80 Mg  Caps (Ziprasidone Hcl) .... Take Two Tablets At Bedtime 5)  Lexapro 10 Mg  Tabs (Escitalopram Oxalate) .... Take Two Tablets in Am 6)  Hydrochlorothiazide 25 Mg  Tabs (Hydrochlorothiazide) .... One Tab By Mouth Once Daily 7)  Loratadine 10 Mg  Tabs (Loratadine) .... One Tab By Mouth Once Daily 8)  Metformin Hcl 1000 Mg Tabs (Metformin Hcl) .... Take 1 Tablet By Mouth Two Times A Day 9)  Lovastatin 40 Mg Tabs (Lovastatin) .... One Tab By Mouth Qhs 10)  Nitrostat 0.4  Mg Subl (Nitroglycerin) .... Sublingualy Every 5 Min As Needed, Max 3 Tabs in 5 Mn. If No Improvement Call 911 11)  Klor-Con M20 20 Meq Cr-Tabs (Potassium Chloride Crys Cr) .... Take 2 Tablest By Mouth Two Times A Day 12)  Omeprazole 20 Mg Tbec (Omeprazole) .... Two Times A Day 13)  Glucotrol Xl 2.5 Mg Xr24h-Tab (Glipizide) .... Take 1 Tablet By Mouth Once A Day  Allergies (verified): 1)  ! Pcn  Review of Systems General:  Complains of fatigue; denies chills and fever. Eyes:  Denies blurring and discharge. ENT:  Denies hoarseness, nasal congestion, and sinus pressure. CV:  Complains of chest pain or discomfort and shortness of breath with exertion; denies difficulty breathing while lying down, palpitations, swelling of feet, and swelling of hands; intermittent chest discomfort with exertional dyspnea. Resp:  Complains of cough and shortness of breath; denies sputum productive; smoker's cough. GI:  Denies abdominal pain, constipation, diarrhea, nausea, and vomiting. GU:  Complains of dysuria and urinary frequency; denies incontinence; 5 days hx. MS:  Complains of joint pain. Derm:  Denies itching and rash. Neuro:  Denies headaches and seizures. Psych:  Complains of anxiety and depression; denies suicidal thoughts/plans, thoughts of violence, and unusual visions or sounds.  Endo:  Denies cold intolerance, excessive hunger, excessive thirst, excessive urination, heat intolerance, polyuria, and weight change. Heme:  Denies abnormal bruising and bleeding. Allergy:  Denies hives or rash and itching eyes.  Physical Exam  General:  Well-developed,well-nourished,in no acute distress; alert,appropriate and cooperative throughout examination HEENT: No facial asymmetry,  EOMI,no sinus tenderness, TM's Clear, oropharynx  pink and moist.   Chest: decreased air entry, scattered crackles and wheezes.  CVS: S1, S2, No murmurs, No S3.   Abd: Soft, Nontender.  MS: Adequate ROM spine, hips, shoulders and  knees.  Ext: No edema.   CNS: CN 2-12 intact, power tone and sensation normal throughout.   Skin: Intact, no visible lesions or rashes.  Psych: Good eye contact, normal affect.  Memory intact, not anxious or depressed appearing.    Impression & Recommendations:  Problem # 1:  NICOTINE ADDICTION (ICD-305.1) Assessment Unchanged  Encouraged smoking cessation and discussed different methods for smoking cessation.   Problem # 2:  GERD (ICD-530.81) Assessment: Unchanged  Her updated medication list for this problem includes:    Omeprazole 20 Mg Tbec (Omeprazole) .Marland Kitchen..Marland Kitchen Two times a day  Problem # 3:  DIABETES MELLITUS, TYPE II (ICD-250.00) Assessment: Deteriorated  Her updated medication list for this problem includes:    Metformin Hcl 1000 Mg Tabs (Metformin hcl) .Marland Kitchen... Take 1 tablet by mouth two times a day    Glucotrol Xl 2.5 Mg Xr24h-tab (Glipizide) .Marland Kitchen... Take 1 tablet by mouth once a day  Orders: T- Hemoglobin A1C ([id number]) T-Urine Microalbumin w/creat. ratio [phone])  Labs Reviewed: Creat: 0.80 (02/23/2009)    Reviewed HgBA1c results: 7.6 (06/28/2009)  7.1 (03/08/2009)  For Dr. Lodema Hong in Her Absence by Dr. Dietrich Pates  Problem # 4:  ACUTE CYSTITIS (ICD-595.0) Assessment: Comment Only  The following medications were removed from the medication list:    Septra Ds 800-160 Mg Tabs (Sulfamethoxazole-trimethoprim) .Marland Kitchen... Take 1 tablet by mouth two times a day  Orders: UA Dipstick W/ Micro (manual) (21308)  Encouraged to push clear liquids, get enough rest, and take acetaminophen as needed. To be seen in 10 days if no improvement, sooner if worse.  The following medications were removed from the medication list:    Septra Ds 800-160 Mg Tabs (Sulfamethoxazole-trimethoprim) .Marland Kitchen... Take 1 tablet by mouth two times a day  Complete Medication List: 1)  Divalproex Sodium 500 Mg Tbec (Divalproex sodium) .... 2 tabs at bedtime 2)  Atenolol 25 Mg Tabs (Atenolol) ....  Take 1 tablet by mouth once a day 3)  Ferrous Sulfate 325 (65 Fe) Mg Tabs (Ferrous sulfate) .... Take 1 tablet by mouth two times a day 4)  Geodon 80 Mg Caps (Ziprasidone hcl) .... Take two tablets at bedtime 5)  Lexapro 10 Mg Tabs (Escitalopram oxalate) .... Take two tablets in am 6)  Hydrochlorothiazide 25 Mg Tabs (Hydrochlorothiazide) .... One tab by mouth once daily 7)  Loratadine 10 Mg Tabs (Loratadine) .... One tab by mouth once daily 8)  Metformin Hcl 1000 Mg Tabs (Metformin hcl) .... Take 1 tablet by mouth two times a day 9)  Lovastatin 40 Mg Tabs (Lovastatin) .... One tab by mouth qhs 10)  Nitrostat 0.4 Mg Subl (Nitroglycerin) .... Sublingualy every 5 min as needed, max 3 tabs in 5 mn. if no improvement call 911 11)  Klor-con M20 20 Meq Cr-tabs (Potassium chloride crys cr) .... Take 2 tablest by mouth two times a day 12)  Omeprazole 20 Mg Tbec (Omeprazole) .... Two times a day 13)  Glucotrol  Xl 2.5 Mg Xr24h-tab (Glipizide) .... Take 1 tablet by mouth once a day 14)  Accu Chek Aviva Strips and Lancets  .... For once daily testing  Other Orders: T-Basic Metabolic Panel [id number]) T-Hepatic Function [phone]) T-Lipid Profile [id number]) T-CBC w/Diff [phone])  Patient Instructions: 1)  Please schedule a follow-up appointment in 2 month. 2)  BMP prior to visit, ICD-9: 3)  Hepatic Panel prior to visit, ICD-9: 4)  Lipid Panel prior to visit, ICD-9: fasting June 15 or after 5)  HbgA1C prior to visit, ICD-9: 6)  CBC w/ Diff prior to visit, ICD-9: 7)  It is not healthy  for men to drink more than 2-3 drinks per day or for women to drink more than 1-2 drinks per day. Prescriptions: OMEPRAZOLE 20 MG TBEC (OMEPRAZOLE) two times a day  #60 x 4   Entered by:   Everitt Amber LPN   Authorized by:   Syliva Overman MD   Signed by:   Everitt Amber LPN on 56/38/7564   Method used:   Electronically to        [facility]  [address] (retail)       [address]       [place], [place]  [address]       Ph: [phone]       Fax: [phone]   RxID:   [id number] KLOR-CON M20 20 MEQ CR-TABS (POTASSIUM CHLORIDE CRYS CR) Take 2 tablest by mouth two times a day  #60 Each x 4   Entered by:   Everitt Amber LPN   Authorized by:   Syliva Overman MD   Signed by:   Everitt Amber LPN on 76/28/3151   Method used:   Electronically to        [facility]  [address] (retail)       [address]       [place], [place]  [address]       Ph: [phone]       Fax: [phone]   RxID:   [id number] METFORMIN HCL 1000 MG TABS (METFORMIN HCL) Take 1 tablet by mouth two times a day  #60 Each x 4   Entered by:   Everitt Amber LPN   Authorized by:   Syliva Overman MD   Signed by:   Everitt Amber LPN on 89/38/1017   Method used:   Electronically to        [facility]  [address] (retail)       [address]       [place], [place]  [address]       Ph: [phone]       Fax: [phone]   RxID:   [id number] HYDROCHLOROTHIAZIDE 25 MG  TABS (HYDROCHLOROTHIAZIDE) one tab by mouth once daily  #30 Each x 4   Entered by:   Everitt Amber LPN   Authorized by:   Syliva Overman MD   Signed by:   Everitt Amber LPN on 24/58/0998   Method used:   Electronically to        [facility]  [address] (retail)       [address]       [place], [place]  [address]       Ph: [phone]       Fax: [phone]  RxID:   [id number] ATENOLOL 25 MG  TABS (ATENOLOL) Take 1 tablet by mouth once a day  #30 Each x 4   Entered by:   Everitt Amber LPN   Authorized by:   Syliva Overman MD   Signed by:   Everitt Amber LPN on 14/78/2956   Method used:   Electronically to        [facility]  [address] (retail)       [address]       [place], [place]  [address]       Ph: [phone]       Fax: [phone]   RxID:   [id number] ACCU CHEK AVIVA STRIPS AND LANCETS for once daily testing  #62month x 3   Entered by:    Everitt Amber LPN   Authorized by:   Syliva Overman MD   Signed by:   Everitt Amber LPN on 25/95/6387   Method used:   Printed then faxed to ...       [facility]  [address] (retail)       [address]       [place], [place]  [address]       Ph: [phone]       Fax: [phone]   RxID:   [id number]   Laboratory Results   Urine Tests    Routine Urinalysis   Color: lt. yellow Appearance: Clear Glucose: negative   (Normal Range: Negative) Bilirubin: negative   (Normal Range: Negative) Ketone: negative   (Normal Range: Negative) Spec. Gravity: 1.020   (Normal Range: 1.003-1.035) Blood: negative   (Normal Range: Negative) pH: 7.0   (Normal Range: 5.0-8.0) Protein: negative   (Normal Range: Negative) Urobilinogen: 0.2   (Normal Range: 0-1) Nitrite: negative   (Normal Range: Negative) Leukocyte Esterace: negative   (Normal Range: Negative)

## 2010-05-16 NOTE — Progress Notes (Signed)
Summary: MEDICINES  Phone Note Call from Patient   Summary of Call: [facility] HAS NOT RECEIVED HER RX FROM [facility] Initial call taken by: Lind Guest,  January 31, 2010 9:30 AM  Follow-up for Phone Call        meds resent Follow-up by: Adella Hare LPN,  January 31, 2010 12:04 PM

## 2010-05-16 NOTE — Progress Notes (Signed)
  Phone Note From Pharmacy   Caller: silver script Summary of Call: omeprazole coverage denied Initial call taken by: Adella Hare LPN,  January 31, 2010 3:04 PM  Follow-up for Phone Call        pls let pt know and i am sending in ranitidine, write d/c omprazole stamp and fax in it is printed  Follow-up by: Syliva Overman MD,  January 31, 2010 3:36 PM  Additional Follow-up for Phone Call Additional follow up Details #1::        patient aware Additional Follow-up by: Adella Hare LPN,  February 01, 2010 11:34 AM    New/Updated Medications: RANITIDINE HCL 150 MG CAPS (RANITIDINE HCL) Take 1 capsule by mouth two times a day discontinue omeprazole Prescriptions: RANITIDINE HCL 150 MG CAPS (RANITIDINE HCL) Take 1 capsule by mouth two times a day discontinue omeprazole  #60 x 3   Entered by:   Adella Hare LPN   Authorized by:   Syliva Overman MD   Signed by:   Adella Hare LPN on 16/01/9603   Method used:   Electronically to        [facility]  [address] (retail)       [address]       [place], [place]  [address]       Ph: [phone]       Fax: [phone]   RxID:   [id number]

## 2010-05-16 NOTE — Letter (Signed)
Summary: Out of [facility]  [address]   [place], [place] [address]   Phone: [phone]  Fax: [phone]    June 06, 2009   Student:  [name]    To Whom It May Concern:   For Medical reasons, please excuse the above named student from school for the following dates:  Start:   June 06, 2009  End:    June 09, 2009  If you need additional information, please feel free to contact our office.   Sincerely,    Esperanza Sheets PA    ****This is a legal document and cannot be tampered with.  Schools are authorized to verify all information and to do so accordingly.

## 2010-05-16 NOTE — Progress Notes (Signed)
Summary: medicine  Phone Note Call from Patient   Summary of Call: ever since she started taking the glipzide 5 mg her sugars are dropping last nighjt 1 hr. after supper it was 65 and wants to know what about taking 2.5 mg   [facility] [place]  call her back at [phone] to let her know Initial call taken by: Lind Guest,  July 21, 2009 3:19 PM  Follow-up for Phone Call        advise her to go back to the 2.5 mg dose, and I will  erx in  Follow-up by: Syliva Overman MD,  July 21, 2009 4:50 PM  Additional Follow-up for Phone Call Additional follow up Details #1::        returned call, left message Additional Follow-up by: Adella Hare LPN,  July 21, 2009 4:53 PM    Additional Follow-up for Phone Call Additional follow up Details #2::    called patient, left message Follow-up by: Adella Hare LPN,  July 22, 2009 2:01 PM  Additional Follow-up for Phone Call Additional follow up Details #3:: Details for Additional Follow-up Action Taken: patient aware Additional Follow-up by: Adella Hare LPN,  July 22, 2009 2:07 PM  New/Updated Medications: GLUCOTROL XL 2.5 MG XR24H-TAB (GLIPIZIDE) Take 1 tablet by mouth once a day Prescriptions: GLUCOTROL XL 2.5 MG XR24H-TAB (GLIPIZIDE) Take 1 tablet by mouth once a day  #30 x 3   Entered and Authorized by:   Syliva Overman MD   Signed by:   Syliva Overman MD on 07/21/2009   Method used:   Electronically to        [facility]  [address] (retail)       [address]       [place], [place]  [address]       Ph: [phone]       Fax: [phone]   RxID:   [id number]

## 2010-05-16 NOTE — Letter (Signed)
Summary: Unable to Reach, Consult Scheduled  [facility]  [address]   [place], [place] [address]   Phone: [phone]  Fax: [phone]    01/02/2010  [name] [address] [place], [place]  [address] 11/28/1965   Dear [name],   We have been unable to reach you by phone.  Please contact our office with an updated phone number.  At the recommendation of Dr Jena Gauss  we have been asked to schedule you for a Gastric Emptying Study. Your appointment is scheduled for January 16, 2010. Please check in at Short Stay at [facility] at 7:30a.m. You may not have anything to eat or drink after midnight the day before. If you are unable to keep this appointment, or if you have any questions,  please call our office at [phone].     Thank you,    Ave Filter  [facility] R. Roetta Sessions, M.D.    Jonette Eva, M.D. Lorenza Burton, FNP-BC    Tana Coast, PA-C Phone: [phone]    Fax: [phone]

## 2010-05-16 NOTE — Letter (Signed)
Summary: Plan of Care, Need to Discuss  [facility]  [address]   [place], [place] [address]   Phone: [phone]  Fax: [phone]    March 01, 2010  [name] [address] [place], [place]  [address] 1965/11/28   Dear [name],   We are writing this letter after several attempts to reach you by phone. Please call our office to get scheduled for your colonoscopy. Please ask to speak to [facility]. If I am busy and you must leave a voice mail, I will return your call as soon as I can. I will be expecting your call. You can reach me at [phone].   Please do not neglect your health.   Sincerely,    Cloria Spring LPN  [facility] Ph: [phone]    Fax: [phone]

## 2010-05-16 NOTE — Progress Notes (Signed)
Summary: nurse   Phone Note Call from Patient   Summary of Call: pt has pneu. and wants to know if doc can tell her anything to make her feel better. [phone] Initial call taken by: Rudene Anda,  January 19, 2010 4:47 PM  Follow-up for Phone Call        called patient twice, no answer Wants to know if there is anything you can tell her to make her feel better. I understand this patient was extremely sick. Waiting for her to call back for more info. Follow-up by: Everitt Amber LPN,  January 20, 2010 8:38 AM  Additional Follow-up for Phone Call Additional follow up Details #1::        Still lightheaded and weak, sugar running high even though she has been off the steroid 4 days (300's) already taking metformin 1000mg  two times a day. Does she need additional sugar med to take for the time being while her sugars are so elevated? Already advised her to limit carbs and sweets and eat fruit/vegs Dr.Simpson already gone and she just called me back   [phone] Additional Follow-up by: Everitt Amber LPN,  January 20, 2010 1:59 PM    Additional Follow-up for Phone Call Additional follow up Details #2::    Advise pt that it may be weeks before her sugar comes down from the steroids. To continue taking Metformin. Rx Glimepiride 2 mg # 30 take one daily in am x 1 rf.  Advise pt that she needs to continue to monitor blood sugar regularly.  If she begins to start having low blood sugar spells to call.  She shouldnt but I want her to be aware to monitor closely. Follow-up by: Esperanza Sheets PA,  January 20, 2010 3:45 PM  Additional Follow-up for Phone Call Additional follow up Details #3:: Details for Additional Follow-up Action Taken: called in Glimepride 2mg  1 tab qam to [facility]. Couldn't enter on med list because another office had OV on hold Patient aware Additional Follow-up by: Everitt Amber LPN,  January 20, 2010 4:32 PM

## 2010-05-16 NOTE — Letter (Signed)
Summary: GES ORDER  GES ORDER   Imported By: Ave Filter 12/30/2009 08:54:15  _____________________________________________________________________  External Attachment:    Type:   Image     Comment:   External Document  Appended Document: GES ORDER Rescheduled to 01/16/10@8 :00a.m.

## 2010-05-16 NOTE — Progress Notes (Signed)
Summary: results  Phone Note Call from Patient Call back at [phone]   Caller: Patient Call For: young Summary of Call: results of labs and allergy test Initial call taken by: Rickard Patience,  February 20, 2010 4:32 PM  Follow-up for Phone Call        Called, spoke with pt.  She was informed of CBC, D Dimmer, and IgE lab results per appends by CY.  She verbalized understanding. Follow-up by: Gweneth Dimitri RN,  February 20, 2010 4:46 PM

## 2010-05-16 NOTE — Letter (Signed)
Summary: Work Excuse  [facility]  [address]   [place], [place] [address]   Phone: [phone]  Fax: [phone]    Today's Date: December 06, 2009  Name of Patient: [name]  The above named patient had a medical visit today at:  9:30 am .  Please take this into consideration when reviewing the time away from school.    Special Instructions:  [ X ] None  [  ] To be off the remainder of today, returning to the normal work / school schedule tomorrow.  [  ] To be off until the next scheduled appointment on ______________________.  [  ] Other ________________________________________________________________ ________________________________________________________________________   Sincerely yours,   Esperanza Sheets PA

## 2010-05-16 NOTE — Progress Notes (Signed)
  Phone Note Call from Patient   Caller: Patient Summary of Call: patient states allergist thinks she is allergic to the keflex for the uti, she doesnt think this is not working anyway can patient come and submit a urine specimen Initial call taken by: Adella Hare LPN,  February 15, 2010 3:11 PM  Follow-up for Phone Call        SHE WAS PRESCRIBED THE KEFLEX FROM ANOTHER OFFICVE SHE NEEDS TO CALL THEM WITH THIS CONCERN Follow-up by: Syliva Overman MD,  February 15, 2010 8:11 PM  Additional Follow-up for Phone Call Additional follow up Details #1::        patient states was from er, er told her to contact pcp Additional Follow-up by: Adella Hare LPN,  February 16, 2010 4:54 PM    Additional Follow-up for Phone Call Additional follow up Details #2::    let her send urine for C/S only pls will prescribe based on result Follow-up by: Syliva Overman MD,  February 16, 2010 5:23 PM  Additional Follow-up for Phone Call Additional follow up Details #3:: Details for Additional Follow-up Action Taken: called patient, no answer Additional Follow-up by: Adella Hare LPN,  February 17, 2010 4:02 PM  patient aware
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
## 2010-05-16 NOTE — Letter (Signed)
Summary: Out of [facility]  [address]   [place], [place] [address]   Phone: [phone]  Fax: [phone]    June 15, 2009   Student:  [name]    To Whom It May Concern:   For Medical reasons, please excuse the above named student from school for the following dates:  Start: 06/06/2009  End:  06/14/2009 To return without restrictions  If you need additional information, please feel free to contact our office.   Sincerely,    Esperanza Sheets, [place]    ****This is a legal document and cannot be tampered with.  Schools are authorized to verify all information and to do so accordingly.

## 2010-05-16 NOTE — Letter (Signed)
[facility]   Imported By: Lind Guest 03/22/2010 11:06:05  _____________________________________________________________________  External Attachment:    Type:   Image     Comment:   External Document

## 2010-05-16 NOTE — Progress Notes (Signed)
Summary: lips going numb  Phone Note Call from Patient   Summary of Call: lips are going numb went to ed and they gave an ant.  went last night they do not why  it is the top and bottom  call back at [phone] Initial call taken by: Lind Guest,  February 06, 2010 1:32 PM  Follow-up for Phone Call        she needs to see an al;lergist for testing they feel she may have had allergic rxn pls make appt with Dr Willa Rough   Additional Follow-up for Phone Call Additional follow up Details #1::        patient aware Additional Follow-up by: Adella Hare LPN,  February 08, 2010 9:03 AM    Additional Follow-up for Phone Call Additional follow up Details #2::    appt dr young nov 2 9:15am patient mother aware Follow-up by: Adella Hare LPN,  February 14, 2010 11:22 AM
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
## 2010-05-16 NOTE — Assessment & Plan Note (Signed)
Summary: F UP   Vital Signs:  Patient profile:   45 year old female Menstrual status:  irregular Height:      64 inches Weight:      186.75 pounds BMI:     32.17 O2 Sat:      97 % Pulse rate:   98 / minute Pulse rhythm:   regular Resp:     16 per minute BP sitting:   122 / 80  (left arm) Cuff size:   large  Vitals Entered By: Everitt Amber LPN (November 22, 2009 1:41 PM)  Nutrition Counseling: Patient's BMI is greater than 25 and therefore counseled on weight management options. CC: was given vicodin for the pain in the right leg at the ER but they wouldn't given her an rx for it so that's what she wants. The tramadol is not helping    Primary Care Provider:  Dr. Syliva Overman  CC:  was given vicodin for the pain in the right leg at the ER but they wouldn't given her an rx for it so that's what she wants. The tramadol is not helping .  History of Present Illness: Reports  that she has not been doing very well. Denies recent fever or chills. Denies sinus pressure, nasal congestion , ear pain or sore throat. Denies chest congestion, or cough productive of sputum. Denies chest pain, palpitations, PND, orthopnea or leg swelling. Denies abdominal pain, nausea, vomitting, diarrhea or constipation. Denies change in bowel movements or bloody stool. Denies dysuria , frequency, incontinence or hesitancy. she was recently in the E with a c/o knee pain, she was given vicodin, wants a script for this and states the tramadol is not working,she has not seen ortho as recommended. Denies headaches, vertigo, seizures. Denies uncontrolled depression, anxiety or insomnia. Denies  rash, lesions, or itch.     Preventive Screening-Counseling & Management  Alcohol-Tobacco     Smoking Cessation Counseling: yes  Allergies: 1)  ! Pcn  Review of Systems      See HPI General:  Complains of fatigue; denies chills and fever. Eyes:  Complains of red eye; red left eye, no drainage or blurring x 3  days. ENT:  Complains of nasal congestion and postnasal drainage; uncontrolled allergy symptoms, used sudafed last night, has had relief, states BP was 142/92. CV:  Denies chest pain or discomfort and swelling of feet. Resp:  Denies cough and sputum productive. GI:  Denies abdominal pain, constipation, diarrhea, nausea, and vomiting. GU:  Denies dysuria and urinary frequency. MS:  Complains of joint pain and stiffness; right knee pain and aching of the right leg, since last week seeiong ortho in 2 days. Derm:  Denies dryness, itching, and rash. Neuro:  Denies headaches and seizures. Psych:  Complains of anxiety, depression, and mental problems; denies sense of great danger, suicidal thoughts/plans, thoughts of violence, and unusual visions or sounds. Endo:  Denies excessive thirst and excessive urination. Heme:  Denies abnormal bruising and bleeding. Allergy:  Complains of seasonal allergies.  Physical Exam  General:  Well-developed,well-nourished,in no acute distress; alert,appropriate and cooperative throughout examination HEENT: No facial asymmetry,  EOMI, No sinus tenderness, TM's Clear, oropharynx  pink and moist.   Chest: Clear to auscultation bilaterally.  CVS: S1, S2, No murmurs, No S3.   Abd: Soft, Nontender.  MS: Adequate ROM spine, hips, shoulders and  reduced in right knee.  Ext: No edema.   CNS: CN 2-12 intact, power tone and sensation normal throughout.   Skin: Intact,  no visible lesions or rashes.  Psych: Good eye contact, normal affect.  Memory intact, not anxious or depressed appearing.    Impression & Recommendations:  Problem # 1:  KNEE PAIN, RIGHT (ICD-719.46) Assessment Deteriorated  Her updated medication list for this problem includes:    Tramadol Hcl 50 Mg Tabs (Tramadol hcl) .Marland Kitchen... Take 1 every 6 hrs as needed for pain    Tylenol Arthritis Pain 650 Mg Cr-tabs (Acetaminophen) ..... One twice daily for 5 days  Orders: Ketorolac-Toradol 15mg   (W4132)  Problem # 2:  HYPERTENSION (ICD-401.9) Assessment: Unchanged  Her updated medication list for this problem includes:    Atenolol 25 Mg Tabs (Atenolol) .Marland Kitchen... Take 1 tablet by mouth once a day    Hydrochlorothiazide 25 Mg Tabs (Hydrochlorothiazide) ..... One tab by mouth once daily  Orders: T- Hemoglobin A1C ([id number])  BP today: 122/80 Prior BP: 124/80 (11/16/2009)  Labs Reviewed: K+: 4.3 (11/16/2009) Creat: : 0.78 (11/16/2009)   Chol: 156 (09/28/2009)   HDL: 28 (09/28/2009)   LDL: 61 (09/28/2009)   TG: 335 (09/28/2009)  Problem # 3:  DIABETES MELLITUS, TYPE II (ICD-250.00) Assessment: Unchanged  Her updated medication list for this problem includes:    Metformin Hcl 1000 Mg Tabs (Metformin hcl) .Marland Kitchen... Take 1 tablet by mouth two times a day  Labs Reviewed: Creat: 0.78 (11/16/2009)    Reviewed HgBA1c results: 7.2 (09/28/2009)  7.2 (09/28/2009)  Problem # 4:  ALCOHOL ABUSE (ICD-305.00) Assessment: Unchanged  Complete Medication List: 1)  Divalproex Sodium 500 Mg Tbec (Divalproex sodium) .... 2 tabs at bedtime 2)  Atenolol 25 Mg Tabs (Atenolol) .... Take 1 tablet by mouth once a day 3)  Ferrous Sulfate 325 (65 Fe) Mg Tabs (Ferrous sulfate) .... Take 1 tablet by mouth two times a day 4)  Lexapro 20 Mg Tabs (Escitalopram oxalate) .Marland Kitchen.. 1 tablet by mouth in mornings 5)  Hydrochlorothiazide 25 Mg Tabs (Hydrochlorothiazide) .... One tab by mouth once daily 6)  Loratadine 10 Mg Tabs (Loratadine) .... One tab by mouth once daily 7)  Metformin Hcl 1000 Mg Tabs (Metformin hcl) .... Take 1 tablet by mouth two times a day 8)  Lovastatin 20 Mg Tabs (Lovastatin) .... 2 tablets by mouth at bedtime 9)  Nitrostat 0.4 Mg Subl (Nitroglycerin) .... Sublingualy every 5 min as needed, max 3 tabs in 5 mn. if no improvement call 911 10)  Klor-con M20 20 Meq Cr-tabs (Potassium chloride crys cr) .... Take 1 tablet by mouth two times a day 11)  Omeprazole 20 Mg Tbec (Omeprazole) .... Two  times a day 12)  Accu Chek Aviva Strips and Lancets  .... For once daily testing 13)  Fish Oil 300 Mg Caps (Omega-3 fatty acids) .... Take 1 tablet by mouth once a day 14)  Meclizine Hcl 12.5 Mg Tabs (Meclizine hcl) .... Take 1 tablet by mouth three times a day as needed for vertigo/dizziness 15)  Lorazepam 2 Mg Tabs (Lorazepam) .... One half to one tab by mouth at bedtime 16)  Tramadol Hcl 50 Mg Tabs (Tramadol hcl) .... Take 1 every 6 hrs as needed for pain 17)  Flonase 50 Mcg/act Susp (Fluticasone propionate) .... 2 puffs per nostril daily 18)  Tylenol Arthritis Pain 650 Mg Cr-tabs (Acetaminophen) .... One twice daily for 5 days  Other Orders: T-Basic Metabolic Panel [id number]) T-Hepatic Function [id number]) T-Lipid Profile [id number]) Admin of Therapeutic Inj  intramuscular or subcutaneous (66063)  Patient Instructions: 1)  F/U Sept 18 or after 2)  Tobacco is very  bad for your health and your loved ones! You Should stop smoking!. 3)  Stop Smoking Tips: Choose a Quit date. Cut down before the Quit date. decide what you will do as a substitute when you feel the urge to smoke(gum,toothpick,exercise). 4)  It is not healthy  for men to drink more than 2-3 drinks per day or for women to drink more than 1-2 drinks per day. 5)  It is important that you exercise regularly at least 20 minutes 5 times a week. If you develop chest pain, have severe difficulty breathing, or feel very tired , stop exercising immediately and seek medical attention. 6)  You need to lose weight. Consider a lower calorie diet and regular exercise.  7)  BMP prior to visit, ICD-9: 8)  Hepatic Panel prior to visit, ICD-9: 9)  Lipid Panel prior to visit, ICD-9:  fasting September 16 10)  HbgA1C prior to visit, ICD-9: 72)  You will get an injection of toradol 60mg  Im in the office today, pls keep your appt with the orthopedic doc Prescriptions: TYLENOL ARTHRITIS PAIN 650 MG CR-TABS (ACETAMINOPHEN) one twice daily for  5 days  #10 x 0   Entered and Authorized by:   Syliva Overman MD   Signed by:   Syliva Overman MD on 11/22/2009   Method used:   Electronically to        [facility] [facility] (retail)       [address]       [place], [place]  [address]       Ph: [phone]       Fax: [phone]   RxID:   [id number] FLONASE 50 MCG/ACT SUSP (FLUTICASONE PROPIONATE) 2 puffs per nostril daily  #1 x 3   Entered and Authorized by:   Syliva Overman MD   Signed by:   Syliva Overman MD on 11/22/2009   Method used:   Electronically to        [facility] [address] [facility] (retail)       [address]       [place], [place]  [address]       Ph: [phone]       Fax: [phone]   RxID:   [id number]    Medication Administration  Injection # 1:    Medication: Ketorolac-Toradol 15mg     Diagnosis: KNEE PAIN, RIGHT (ICD-719.46)    Route: IM    Site: RUOQ gluteus    Exp Date: 06/2011    Lot #: [id number]     Mfr: [email or web address]    Comments: 60mg  given     Patient tolerated injection without complications    Given by: Everitt Amber LPN (November 22, 2009 3:21 PM)  Orders Added: 1)  Est. Patient Level IV [63016] 2)  T-Basic Metabolic Panel [[id number]] 3)  T-Hepatic Function [80076-22960] 4)  T-Lipid Profile [80061-22930] 5)  T- Hemoglobin A1C [83036-23375] 6)  Ketorolac-Toradol 15mg  [J1885] 7)  Admin of Therapeutic Inj  intramuscular or subcutaneous [32202]

## 2010-05-16 NOTE — Letter (Signed)
Summary: Out of [facility]  [address]   [place], [place] [address]   Phone: [phone]  Fax: [phone]    June 27, 2009   Student:  RANDEE HUSTON    To Whom It May Concern:   For Medical reasons, please excuse the above named student from school for the following dates:  Start:   June 27, 2009  End:    June 28, 2009  If you need additional information, please feel free to contact our office.   Sincerely,    Syliva Overman, MD    ****This is a legal document and cannot be tampered with.  Schools are authorized to verify all information and to do so accordingly.

## 2010-05-16 NOTE — Miscellaneous (Signed)
Summary: med update  Clinical Lists Changes  Medications: Added new medication of GLIMEPIRIDE 2 MG TABS (GLIMEPIRIDE) one tab  every morning  called in lastweek per dawn because there was an OV on hold and couldn't add it to the list at the time

## 2010-05-16 NOTE — Assessment & Plan Note (Signed)
Summary: pt been having problems with chest pain/tg  Medications Added LEXAPRO 20 MG TABS (ESCITALOPRAM OXALATE) 1 tablet by mouth in mornings LOVASTATIN 20 MG TABS (LOVASTATIN) 2 tablets by mouth at bedtime KLOR-CON M20 20 MEQ CR-TABS (POTASSIUM CHLORIDE CRYS CR) Take 1 tablet by mouth two times a day GEODON 40 MG CAPS (ZIPRASIDONE HCL) 1 tablet by mouth at bedtime TRAZODONE HCL 100 MG TABS (TRAZODONE HCL) 1 tablet by mouth at bedtime as needed GLUCOTROL XL 2.5 MG XR24H-TAB (GLIPIZIDE) 1 tablet by mouth once daily GEODON 40 MG CAPS (ZIPRASIDONE HCL) 1 tablet by mouth once daily      Allergies Added:   Referring Provider:  [facility]  Primary Provider:  Dr. Syliva Overman   History of Present Illness: [name] [name] returns to the office somewhat beyond her scheduled visit for continuing assessment and treatment of pedal edema, chest discomfort and dyspnea in the setting of multiple cardiovascular risk factors but no significant coronary disease at catheterization in 09/2005.  She has done generally well over the past year, but notes increasing chest discomfort of late.  She describes this as moderately severe left chest discomfort radiating to the left arm and left back.  There is no associated dyspnea nor diaphoresis.  Symptoms are relieved within one half hour following sublingual nitroglycerin.  There is no relationship to exertion nor to movement of the left arm or trunk.  Patient also reports palpitations.  She experiences these generally at night immediately after retiring for bed.  They are rapid and forceful, feeling as if her heart will beat out of her chest.  Symptoms are relatively brief duration-she has never had a spell lasting longer than a few minutes.  She does not note any irregularity of her heartbeat.  There is no associated lightheadedness, syncope, chest discomfort nor dyspnea.  She is not having much trouble with anxiety.  She is attending college full time, making  good grades and generally enjoying herself.  Current Medications (verified): 1)  Divalproex Sodium 500 Mg Tbec (Divalproex Sodium) .... 2 Tabs At Bedtime 2)  Atenolol 25 Mg  Tabs (Atenolol) .... Take 1 Tablet By Mouth Once A Day 3)  Ferrous Sulfate 325 (65 Fe) Mg  Tabs (Ferrous Sulfate) .... Take 1 Tablet By Mouth Two Times A Day 4)  Geodon 80 Mg  Caps (Ziprasidone Hcl) .... Take Two Tablets At Bedtime 5)  Lexapro 20 Mg Tabs (Escitalopram Oxalate) .Marland Kitchen.. 1 Tablet By Mouth in Mornings 6)  Hydrochlorothiazide 25 Mg  Tabs (Hydrochlorothiazide) .... One Tab By Mouth Once Daily 7)  Loratadine 10 Mg  Tabs (Loratadine) .... One Tab By Mouth Once Daily 8)  Metformin Hcl 1000 Mg Tabs (Metformin Hcl) .... Take 1 Tablet By Mouth Two Times A Day 9)  Lovastatin 20 Mg Tabs (Lovastatin) .... 2 Tablets By Mouth At Bedtime 10)  Nitrostat 0.4 Mg Subl (Nitroglycerin) .... Sublingualy Every 5 Min As Needed, Max 3 Tabs in 5 Mn. If No Improvement Call 911 11)  Klor-Con M20 20 Meq Cr-Tabs (Potassium Chloride Crys Cr) .... Take 1 Tablet By Mouth Two Times A Day 12)  Omeprazole 20 Mg Tbec (Omeprazole) .... Two Times A Day 13)  Glucotrol Xl 2.5 Mg Xr24h-Tab (Glipizide) .... Take 1 Tablet By Mouth Once A Day 14)  Accu Chek Aviva Strips and Lancets .... For Once Daily Testing 15)  Geodon 40 Mg Caps (Ziprasidone Hcl) .Marland Kitchen.. 1 Tablet By Mouth At Bedtime 16)  Trazodone Hcl 100 Mg Tabs (Trazodone Hcl) .Marland KitchenMarland KitchenMarland Kitchen 1  Tablet By Mouth At Bedtime As Needed 17)  Glucotrol Xl 2.5 Mg Xr24h-Tab (Glipizide) .Marland Kitchen.. 1 Tablet By Mouth Once Daily 18)  Geodon 40 Mg Caps (Ziprasidone Hcl) .Marland Kitchen.. 1 Tablet By Mouth Once Daily  Allergies (verified): 1)  ! Pcn  Past History:  PMH, FH, and Social History reviewed and updated.  Review of Systems       See history of present illness.  Vital Signs:  Patient profile:   45 year old female Menstrual status:  irregular Weight:      189 pounds O2 Sat:      96 % Pulse rate:   98 / minute BP sitting:    117 / 81  (left arm)  Vitals Entered ByLarita Fife Via LPN (September 27, 2009 2:03 PM)   Impression & Recommendations:  Problem # 1:  CHEST PAIN (ICD-786.50) Symptoms remain atypical.  She has had inadequate coronary disease to account for her discomfort myocardial ischemia.  Since she is relatively satisfied with her current level of symptoms, sublingual nitroglycerin will continue to be used p.r.n.  A trial of amlodipine could be considered, which would also likely be beneficial for her hypertension.    Problem # 2:  HYPERLIPIDEMIA (ICD-272.4)  Lipid profile was excellent 7 months ago and can be repeated sometime next year.  Problem # 3:  HYPERTENSION (ICD-401.9) Blood pressure control is optimal.  Current medications will be continued.  Patient was congratulated on a 14 pound weight loss.  She does not believe that she is doing anything to foster weight reduction, but I encouraged her to continue whatever is having this beneficial effect.  Problem # 4:  TOBACCO ABUSE (ICD-305.1) Patient has tried numerous pharmaceutical preparations to assist with tobacco cessation, but nothing has been successful.  She is encouraged to continue her efforts.  Problem # 5:  DIABETES MELLITUS, TYPE II (ICD-250.00) Control of serum glucose has been excellent with values typically well less than 140.  Patient will continue her current approach to diabetes.  Problem # 6:  PALPITATIONS (ICD-785.1) Symptoms are certainly compatible with arrhythmia, but frequently an arrhythmic cause is not identified in this situation.  Since she has symptoms nearly every day, a 48 hour Holter recording will be obtained.  If an event is not captured during that interval, an event recorder will be obtained for her.  I will reassess this nice woman once antiarrhythmic monitoring has been completed.   Other Orders: EKG w/ Interpretation (93000) Holter Monitor (Holter Monitor)  Patient Instructions: 1)  Your physician recommends that  you schedule a follow-up appointment in: 6 weeks 2)  Your physician has recommended that you wear a holter monitor.  Holter monitors are medical devices that record the heart's electrical activity. Doctors most often use these monitors to diagnose arrhythmias. Arrhythmias are problems with the speed or rhythm of the heartbeat. The monitor is a small, portable device. You can wear one while you do your normal daily activities. This is usually used to diagnose what is causing palpitations/syncope (passing out).

## 2010-05-16 NOTE — Assessment & Plan Note (Signed)
Summary: 1 mth nurse visit per checkout on 12/16/09/tg  Nurse Visit   Vital Signs:  Patient profile:   45 year old female Menstrual status:  irregular Height:      64 inches Weight:      177 pounds O2 Sat:      99 % on Room air Temp:     98.5 degrees F oral BP sitting:   110 / 60  (left arm)  Vitals Entered By: Teressa Lower RN (January 19, 2010 4:36 PM)  O2 Flow:  Room air   Visit Type:  1 month nurse visit Referring Provider:  [facility]  Primary Provider:  Dr. Syliva Overman   History of Present Illness: S: 1 month nurse visit, office visit 12/16/09 B: Pt hospitalized 01/02/10-01/08/10 for acute respiratory failure secondary to presumed viral pneumonitis.  A: pt seen in the office for bp check, medications updated per hospital  discharge. pt c/o  feeling tired and weak since discharge.  returned bp diary, bp's are all over the place nothing is consistent. R: asked pt to continue taking bp until she hears from [name]., hospital labs and test placed in nurse visit.  01/23/10  18 BP determinations->6 with systolics>140 and 5 with diastolics>90.  Max 155/100.  Add amlodipine 5 mg once daily BP check 1 month with home BPs until then.     Current Medications (verified): 1)  Metformin Hcl 1000 Mg Tabs (Metformin Hcl) .... Take 1 Tablet By Mouth Two Times A Day 2)  Lovastatin 20 Mg Tabs (Lovastatin) .... 2 Tablets By Mouth At Bedtime 3)  Nitrostat 0.4 Mg Subl (Nitroglycerin) .... Sublingualy Every 5 Min As Needed, Max 3 Tabs in 5 Mn. If No Improvement Call 911 4)  Omeprazole 20 Mg Tbec (Omeprazole) .... Take 1 Tablet By Mouth Two Times A Day 5)  Ferrous Sulfate 325 (65 Fe) Mg Tabs (Ferrous Sulfate) .... Take 1 Tablet By Mouth Two Times A Day 6)  Megestrol Acetate 40 Mg Tabs (Megestrol Acetate) .... Take 1 Tablet By Mouth Once A Day 7)  Klor-Con M20 20 Meq Cr-Tabs (Potassium Chloride Crys Cr) .... Take 1 Tablet By Mouth Once A Day 8)  Metoprolol Tartrate 50 Mg Tabs (Metoprolol  Tartrate) .... Take 1 Tablet By Mouth Two Times A Day 9)  Divalproex Sodium 500 Mg Tbec (Divalproex Sodium) .... Take 2 Tablets By Mouth At Bedtime 10)  Lexapro 20 Mg Tabs (Escitalopram Oxalate) .... Take 1 Tablet By Mouth Once A Day 11)  Hydrochlorothiazide 25 Mg Tabs (Hydrochlorothiazide) .... Take 1 Tablet By Mouth Once A Day 12)  Zyrtec Allergy 10 Mg  Tabs (Cetirizine Hcl) .... By Mouth Daily As Needed Itchy Sneezy Runny Nose 13)  Proair Hfa 108 (90 Base) Mcg/act Aers (Albuterol Sulfate) .... 2 Puffs Every 6 Hrs As Needed For Shortness of Breath 14)  Cetirizine Hcl 10 Mg Tabs (Cetirizine Hcl) .... Take 1 Tablet By Mouth Once A Day  Allergies (verified): 1)  ! Pcn 2)  ! Flagyl  Prescriptions: AMLODIPINE BESYLATE 5 MG TABS (AMLODIPINE BESYLATE) Take one tablet by mouth daily  #30 x 3   Entered by:   Teressa Lower RN   Authorized by:   Kathlen Brunswick, MD, [facility]   Signed by:   Teressa Lower RN on 01/23/2010   Method used:   Electronically to        [facility] [facility] (retail)       [address]  [place], [place]  [address]       Ph: [phone]       Fax: [phone]   RxID:   [id number]    CXR  Procedure date:  01/02/2010  Findings:        IMPRESSION:   Multifocal pneumonia, more diffuse on the right, significantly   worsened from the prior study.    Read By:  Tonia Ghent,  M.D.  CXR  Procedure date:  01/05/2010  Findings:       IMPRESSION:   Diffuse interstitial and airspace disease is slightly worse than on   the prior exam.  This is most concerning for edema, but multifocal   infection is also included in the differential diagnosis.   CXR  Procedure date:  01/05/2010  Findings:       IMPRESSION:   No acute disease    Read By:  Deanne Coffer, D. Reuel Boom,  M.D.  Echocardiogram  Procedure date:  01/03/2010  Findings:        Study Conclusions    - Left ventricle: The cavity size was normal. Wall thickness was     normal.  Systolic function was normal. The estimated ejection     fraction was in the range of 55% to 60%. Wall motion was normal;     there were no regional wall motion abnormalities. Features are     consistent with a pseudonormal left ventricular filling pattern,     with concomitant abnormal relaxation and increased filling     pressure (grade 2 diastolic dysfunction).   - Aortic valve: There was no stenosis.   - Mitral valve: Trivial regurgitation.   - Right ventricle: The cavity size was normal. Systolic function was     normal.   - Tricuspid valve: Peak RV-RA gradient: 38mm Hg (S).   - Pulmonary arteries: PA systolic pressure 44-48 mmHg.   - Systemic veins: IVC measured 2.2 cm with normal respirophasic     variation, suggesting RA pressure 6-10 mmHg.   - Pericardium, extracardiac: A trivial pericardial effusion was     identified posterior to the heart. I spoke with pt, new medication instructions given and ordered, nurse visit scheduled  Teressa Lower RN  January 23, 2010 4:33 PM

## 2010-05-16 NOTE — Progress Notes (Signed)
Summary: dermatologist appt.  Phone Note Call from Patient   Summary of Call: patient called and would like to know if Dr. Lodema Hong can set her up an appt. with a dermatologist, she is still breaking out not itching as bad but in some places she is.  I advised patient dr. Lodema Hong was out of the office until Wednesday. Initial call taken by: Curtis Sites,  February 20, 2010 2:51 PM  Follow-up for Phone Call        patient just had appt with allergist last week Follow-up by: Adella Hare LPN,  February 20, 2010 2:55 PM  Additional Follow-up for Phone Call Additional follow up Details #1::        pls refer to dto drmatology to eval puritic rash, I wil put in appt box also Additional Follow-up by: Syliva Overman MD,  February 20, 2010 3:19 PM    Additional Follow-up for Phone Call Additional follow up Details #2::    patient has an appt. on the 17th of Nov. with Dr. Margo Aye, she is aware of appt, Follow-up by: Curtis Sites,  February 20, 2010 3:45 PM

## 2010-05-16 NOTE — Progress Notes (Signed)
  Phone Note Call from Patient   Summary of Call: On Call Note 12/11/09 6:31 pm  [phone] Pt states she had a BM this am that had bright red blood.  Had another this afternoon though that was normal.  No abd pain.  No fever.  Pt wondering if she needs to go to ER.  Advised pt if she has more rectal bleeding tonight, or develops abd pain, then she needs to go to ER. Otherwise call office in am for an appt. Initial call taken by: Esperanza Sheets PA,  December 12, 2009 2:19 PM

## 2010-05-16 NOTE — Progress Notes (Signed)
Summary: TO BP IS STILL HIGH ON NEW MEDS   Phone Note Call from Patient Call back at Home Phone [phone] Call back at [phone]   Caller: PT Reason for Call: Talk to Nurse Summary of Call: PT STATES SINCE SHE HAS BEEN ON AMOLODIPINE 2.5MG  HER BP IS HIGH EVERY DAY. WANTS TO KNOW CAN SHE INCREASE IT? Initial call taken by: Faythe Ghee,  December 27, 2009 12:03 PM  Follow-up for Phone Call        pt has only been on medication for 4 days asked pt to continue current medical treatment and scheduled nurse visti for 01/16/10 at 4pm Follow-up by: Teressa Lower RN,  December 27, 2009 4:43 PM

## 2010-05-16 NOTE — Progress Notes (Signed)
Summary: returning call--lips going numb  Phone Note Call from Patient Call back at [phone]   Caller: Patient Call For: wert Summary of Call: Pt states her lips are going numb since yesterday, I told her it may be an issue to discuss with her primary, she states she wanted MW to address this, pls advise.//[facility] [place] Initial call taken by: Darletta Moll,  February 06, 2010 2:02 PM  Follow-up for Phone Call        [facility] on [phone]. Called [phone] and spoke with pt's mother who advised pt is in an area w/o telephone service. I advised pt's mother to have pt call back if needed. Zackery Barefoot CMA  February 06, 2010 2:16 PM   Additional Follow-up for Phone Call Additional follow up Details #1::        Patient calling again about lips going numb.  [phone]  Additional Follow-up by: Lehman Prom,  February 06, 2010 2:20 PM    Additional Follow-up for Phone Call Additional follow up Details #2::    Spoke with pt.  She states that she was recently txed at ED for UTI- ? the name of the abx, and since then has noticed numbness in her lips.  I advised that this is something that she needs to discuss with her PCP.  Pt verbalized understanding. Follow-up by: Vernie Murders,  February 06, 2010 2:47 PM

## 2010-05-16 NOTE — Progress Notes (Signed)
Summary: ntg refill   Phone Note Call from Patient Call back at [phone]   Caller: Patient Summary of Call: pt states that she needs "chest pain" pills refilled/ when asked what the name was, she stated she didn't know/ I asked if they were NTG and she said yes/ needs them called into [facility]/tg Initial call taken by: Raechel Ache [facility],  September 23, 2009 2:08 PM    Prescriptions: NITROSTAT 0.4 MG SUBL (NITROGLYCERIN) sublingualy every 5 min as needed, max 3 tabs in 5 mn. if no improvement call 911  #25 Each x 3   Entered by:   Teressa Lower RN   Authorized by:   Kathlen Brunswick, MD, [facility]   Signed by:   Teressa Lower RN on 09/23/2009   Method used:   Electronically to        [facility]  [address] (retail)       [address]       [place], [place]  [address]       Ph: [phone]       Fax: [phone]   RxID:   [id number]

## 2010-05-16 NOTE — Letter (Signed)
Summary: Laboratory/X-Ray Results  [facility]  [address]   [place], [place] [address]   Phone: [phone]  Fax: [phone]    Lab/X-Ray Results  March 01, 2010  MRN: [id number]  [name] [address] [place], [place]  [address]    The results of your recent mammogram has been reviewed and were found: NORMAL    If you have any questions, please contact our office.     Adella Hare LPN

## 2010-05-16 NOTE — Progress Notes (Signed)
Summary: REF  Phone Note Call from Patient   Summary of Call: SAID THAT SHE NEEEDS A REFERRAL TO A ALLERGIST  # [phone] Initial call taken by: Lind Guest,  February 13, 2010 10:57 AM  Follow-up for Phone Call        please call patient at [phone]  Follow-up by: Curtis Sites,  February 13, 2010 4:05 PM  Additional Follow-up for Phone Call Additional follow up Details #1::        pls call pt and document her concerns why she believes she need allergy testing and send me the info. Additional Follow-up by: Syliva Overman MD,  February 14, 2010 5:20 AM    Additional Follow-up for Phone Call Additional follow up Details #2::    called patient, left message Follow-up by: Mauricia Area CMA,  February 14, 2010 10:29 AM

## 2010-05-16 NOTE — Letter (Signed)
Summary: Record of home blood pressure determinations  External Other   Imported By: Joanna Puff 01/23/2010 16:06:08  _____________________________________________________________________  External Attachment:    Type:   Image     Comment:   External Document  Appended Document: Record of home blood pressure determinations    Clinical Lists Changes  Problems: Removed problem of CHEST PAIN UNSPECIFIED (ICD-786.50) Removed problem of RESPIRATORY FAILURE, ACUTE (ICD-518.81) Removed problem of ACUTE CYSTITIS (ICD-595.0) Removed problem of INFLUENZA (ICD-487.8) Removed problem of ACUTE BRONCHITIS (ICD-466.0) Removed problem of ACUTE MAXILLARY SINUSITIS (ICD-461.0) Removed problem of DIARRHEA (ICD-787.91) Removed problem of PNEUMONIA DUE TO OTHER SPECIFIED ORGANISM (ICD-483.8) Removed problem of GASTROENTERITIS, ACUTE (ICD-558.9) Removed problem of NUMBNESS (ICD-782.0) Removed problem of WEIGHT LOSS (ICD-783.21) Removed problem of NAUSEA AND VOMITING (ICD-787.01) Removed problem of VOMITING ALONE (ICD-787.03) Removed problem of OTHER DRUG ALLERGY (ICD-995.27) Removed problem of LEG PAIN, RIGHT (ICD-729.5) Removed problem of NAUSEA (ICD-787.02) Removed problem of DIZZINESS (ICD-780.4) Removed problem of VAGINITIS (ICD-616.10) Removed problem of INGUINAL PAIN, LEFT (ICD-789.09) Observations: Added new observation of CARDIO HPI: [name] [facility] of emergency department records (02/10/2010 23:17) Added new observation of REFERRING MD: [facility]  (02/10/2010 23:17) Added new observation of PRIMARY MD: Dr. Syliva Overman (02/10/2010 23:17) Added new observation of CXR RESULTS: Right lung airspace opacification;  milder left basilar and midlung zone airspace disease compatible with bilateral pneumonia No cardiomegaly (01/02/2010 23:20) Added new observation of PLATELETK/UL: 215 K/uL (01/02/2010 23:17) Added new observation of MCV: 90 fL (01/02/2010 23:17) Added new  observation of HCT: 34.1 % (01/02/2010 23:17) Added new observation of HGB: 11.4 g/dL (91/47/8295 62:13) Added new observation of WBC COUNT: 10.6 10*3/microliter (01/02/2010 23:17) Added new observation of PO2 (ART): 99 mm Hg (01/02/2010 23:17) Added new observation of PCO2 (ART): 32 mm Hg (01/02/2010 23:17) Added new observation of PH (ART): 7.39  (01/02/2010 23:17) Added new observation of FIO2: 1.0  (01/02/2010 23:17) Added new observation of BNP: 39  (01/02/2010 23:17) Added new observation of D_DIMER FEU: 1.52 ug/mL FEU (01/02/2010 23:17)       Referring Provider:  [facility]  Primary Provider:  Dr. Syliva Overman   History of Present Illness: [name]   CXR  Procedure date:  01/02/2010  Findings:      Right lung airspace opacification;  milder left basilar and midlung zone airspace disease compatible with bilateral pneumonia No cardiomegaly

## 2010-05-16 NOTE — Letter (Signed)
Summary: Letter  Letter   Imported By: Lind Guest 09/30/2009 08:50:38  _____________________________________________________________________  External Attachment:    Type:   Image     Comment:   External Document

## 2010-05-16 NOTE — Assessment & Plan Note (Signed)
Summary: shot  Nurse Visit   Vital Signs:  Patient profile:   45 year old female Menstrual status:  irregular Height:      64 inches Weight:      184 pounds BMI:     31.70 O2 Sat:      94 % Pulse rate:   87 / minute Resp:     16 per minute  Vitals Entered By: Everitt Amber LPN (December 08, 2009 4:25 PM) CC: Patient stated she needed another tetanus for her college. Dawn said alright to administer   Allergies: 1)  ! Pcn  Orders Added: 1)  Tdap => 35yrs IM [90715] 2)  Admin 1st Vaccine [90471] 3)  Admin 1st Vaccine [facility]) [[address]U]   Tetanus/Td Vaccine    Vaccine Type: Tdap    Site: right deltoid    Mfr: boodtrix    Dose: 0.5 ml    Route: IM    Given by: Everitt Amber LPN    Exp. Date: 07/09/2011    Lot #: [id number]

## 2010-05-16 NOTE — Assessment & Plan Note (Signed)
Summary: HOSPITAL FOLLOW UP   Vital Signs:  Patient profile:   45 year old female Menstrual status:  irregular Height:      64 inches Weight:      178.75 pounds BMI:     30.79 O2 Sat:      99 % on Room air Pulse rate:   87 / minute Pulse rhythm:   regular Resp:     16 per minute BP sitting:   140 / 92  (left arm)  Vitals Entered By: Adella Hare LPN (January 17, 2010 8:36 AM)  Nutrition Counseling: Patient's BMI is greater than 25 and therefore counseled on weight management options.  O2 Flow:  Room air CC: HOSPITAL FOLLOW UP Is Patient Diabetic? Yes Pain Assessment Patient in pain? no      CBG Result 331   Primary Care Provider:  Lodema Hong  CC:  HOSPITAL FOLLOW UP.  History of Present Illness: pT RECENTLY HOSPITALISED WITH RESPIRAORY FAILURE DUE TO PNEUMONIA. sHE STILL REPORTS SOME COUGH, though she is producing no sputum, she reports weakness and fatigue , but denies chills and fever. She was recently evaluated by pulmonary, there is a question of hypotension, at this visit her bP is not low, so no changes will be made in her meds. She has been having elevated blood sugars due to high doses of steroids, and does report polydypsia and polyuria. she denies current nicotine or alcohol use.  Current Medications (verified): 1)  Metformin Hcl 1000 Mg Tabs (Metformin Hcl) .... Take 1 Tablet By Mouth Two Times A Day 2)  Lovastatin 20 Mg Tabs (Lovastatin) .... 2 Tablets By Mouth At Bedtime 3)  Nitrostat 0.4 Mg Subl (Nitroglycerin) .... Sublingualy Every 5 Min As Needed, Max 3 Tabs in 5 Mn. If No Improvement Call 911 4)  Omeprazole 20 Mg Tbec (Omeprazole) .... Take One 30-60 Min Before First and Last Meals of The Day 5)  Accu Chek Aviva Strips and Lancets .... For Once Daily Testing 6)  Ferrous Sulfate 325 (65 Fe) Mg Tabs (Ferrous Sulfate) .Marland Kitchen.. 1 Two Times A Day 7)  Megestrol Acetate 40 Mg Tabs (Megestrol Acetate) .... Take 1 Tablet By Mouth Once A Day 8)  Klor-Con M20 20 Meq  Cr-Tabs (Potassium Chloride Crys Cr) .Marland Kitchen.. 1 Once Daily 9)  Metoprolol Tartrate 50 Mg Tabs (Metoprolol Tartrate) .Marland Kitchen.. 1 Two Times A Day 10)  Divalproex Sodium 500 Mg Tbec (Divalproex Sodium) .... 2 At Bedtime 11)  Lexapro 20 Mg Tabs (Escitalopram Oxalate) .Marland Kitchen.. 1 Every Am 12)  Hydrochlorothiazide 25 Mg Tabs (Hydrochlorothiazide) .Marland Kitchen.. 1 Every Am 13)  Zyrtec Allergy 10 Mg  Tabs (Cetirizine Hcl) .... By Mouth Daily As Needed Itchy Sneezy Runny Nose 14)  Diflucan 100 Mg Tabs (Fluconazole) .... One Today  Allergies (verified): 1)  ! Pcn 2)  ! Flagyl  Past History:  Past medical, surgical, family and social histories (including risk factors) reviewed, and no changes noted (except as noted below).  Past Medical History: ASCVD: Minimal at cath in 09/2005; negative pharm. stress nuclear study in 8/08 with nl EF; neg. stress echo'-10 AODM-no insulin; onset 2000 HYPERLIPIDEMIA HYPERTENSION-during treatment treatment with Geodon Tobacco abuse: 30 pack years; continuing at one pack per day Gastroesophageal reflux disease; Schatzki's ring Iron deficiency anemia  DEPRESSION (ICD-311) ALCOHOL ABUSE (ICD-305.00) OBESITY (ICD-278.00) Diastolic dysfunction 12/2009  Past Surgical History: Reviewed history from 03/29/2007 and no changes required. D/C-1992  Family History: Reviewed history from 12/16/2009 and no changes required. Mother is alive at age 5 with hypertension Dad deceased age 62  CVA Sister 4 healthy; one with heart disease Brother 3 healthy  Social History: Reviewed history from 09/27/2009 and no changes required. 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 Eyes:  Denies discharge. ENT:  Denies nasal congestion, sinus pressure, and sore throat. CV:  Complains of lightheadness and shortness of breath with exertion; denies chest pain or discomfort, palpitations, and swelling of feet. Resp:  Complains of cough, shortness of breath, and wheezing. GI:  Complains of abdominal pain, diarrhea, and nausea; 2 to 4 watery stools daily since hospitalisation, sept 23rd to present. GU:  Complains of discharge; vaginal itch post prolonged antibiotics and high dose prednisone. MS:  Denies joint pain and stiffness. Derm:  Denies itching and rash. Neuro:  Denies headaches, seizures, and sensation of room spinning. Psych:  Complains of depression; denies anxiety. Endo:  Complains of excessive thirst and excessive urination; blood sugars often over 250 since hospitalisation. Heme:  Denies abnormal bruising and bleeding. Allergy:  Denies hives or rash and itching eyes.  Physical Exam  General:  Ill appearing , with significant weight loss, in no cP ditress. HEENT: No facial asymmetry,  EOMI, No sinus tenderness, TM's Clear, oropharynx  pink and moist, poor dentition. Chest: decreased air entry , no crackles or wheezes CVS: S1, S2, No murmurs, No S3.   Abd: Soft, Nontender.  MS: Adequate ROM spine, hips, shoulders and knees.  Ext: No edema.   CNS: CN 2-12 intact, power tone and sensation normal throughout.   Skin: Intact, no visible lesions or rashes.  Psych: Good eye contact, normal affect.  Memory intact, not anxious or depressed appearing.    Impression & Recommendations:  Problem # 1:  PNEUMONIA DUE TO OTHER SPECIFIED ORGANISM (ICD-483.8) Assessment Improved  Problem # 2:  HYPERTENSION (ICD-401.9) Assessment: Deteriorated  The following medications were removed from the medication list:    Amlodipine Besylate 2.5 Mg Tabs (Amlodipine besylate) .Marland Kitchen... Take 1 tablet by mouth once daily Her updated medication list for this problem includes:     Metoprolol Tartrate 50 Mg Tabs (Metoprolol tartrate) .Marland Kitchen... 1 two times a day    Hydrochlorothiazide 25 Mg Tabs (Hydrochlorothiazide) .Marland Kitchen... 1 every am  BP today: 140/92 Prior BP: 130/80 (12/29/2009)  Labs Reviewed: K+: 4.3 (11/16/2009) Creat: : 0.78 (11/16/2009)   Chol: 156 (09/28/2009)   HDL: 28 (09/28/2009)   LDL: 61 (09/28/2009)   TG: 335 (09/28/2009)  Problem # 3:  HYPERLIPIDEMIA (ICD-272.4) Assessment: Comment Only  Her updated medication list for this problem includes:    Lovastatin 20 Mg Tabs (Lovastatin) .Marland Kitchen... 2 tablets by mouth at bedtime Low fat dietdiscussed and encouraged  Labs Reviewed: SGOT: 8 (09/28/2009)   SGPT: 8 (09/28/2009)   HDL:28 (09/28/2009), 28 (09/28/2009)  LDL:61 (09/28/2009), 61 (09/28/2009)  Chol:156 (09/28/2009), 156 (09/28/2009)  Trig:335 (09/28/2009), 335 (09/28/2009)  Problem # 4:  DIABETES MELLITUS, TYPE II (ICD-250.00) Assessment: Deteriorated  Her updated medication list for this problem includes:    Metformin Hcl 1000 Mg Tabs (Metformin hcl) .Marland Kitchen... Take 1 tablet by mouth two  times a day  Orders: Insulin 5 units (J1815) Glucose, (CBG) ([address])  Labs Reviewed: Creat: 0.78 (11/16/2009)    Reviewed HgBA1c results: 7.2 (09/28/2009)  7.2 (09/28/2009)  Problem # 5:  DIASTOLIC DYSFUNCTION (ICD-429.9) Assessment: New pt followed by cardiology  Problem # 6:  NAUSEA (ICD-787.02) Assessment: Deteriorated zofran administered  Complete Medication List: 1)  Metformin Hcl 1000 Mg Tabs (Metformin hcl) .... Take 1 tablet by mouth two times a day 2)  Lovastatin 20 Mg Tabs (Lovastatin) .... 2 tablets by mouth at bedtime 3)  Nitrostat 0.4 Mg Subl (Nitroglycerin) .... Sublingualy every 5 min as needed, max 3 tabs in 5 mn. if no improvement call 911 4)  Omeprazole 20 Mg Tbec (Omeprazole) .... Take one 30-60 min before first and last meals of the day 5)  Accu Chek Aviva Strips and Lancets  .... For once daily testing 6)  Ferrous Sulfate 325 (65 Fe) Mg Tabs  (Ferrous sulfate) .Marland Kitchen.. 1 two times a day 7)  Megestrol Acetate 40 Mg Tabs (Megestrol acetate) .... Take 1 tablet by mouth once a day 8)  Klor-con M20 20 Meq Cr-tabs (Potassium chloride crys cr) .Marland Kitchen.. 1 once daily 9)  Metoprolol Tartrate 50 Mg Tabs (Metoprolol tartrate) .Marland Kitchen.. 1 two times a day 10)  Divalproex Sodium 500 Mg Tbec (Divalproex sodium) .... 2 at bedtime 11)  Lexapro 20 Mg Tabs (Escitalopram oxalate) .Marland Kitchen.. 1 every am 12)  Hydrochlorothiazide 25 Mg Tabs (Hydrochlorothiazide) .Marland Kitchen.. 1 every am 13)  Zyrtec Allergy 10 Mg Tabs (Cetirizine hcl) .... By mouth daily as needed itchy sneezy runny nose 14)  Diflucan 100 Mg Tabs (Fluconazole) .... One today 15)  Fluconazole 150 Mg Tabs (Fluconazole) .... Take 1 tablet by mouth once a day  Other Orders: T-Basic Metabolic Panel [id number]) Zofran 1mg . injection (B1478) Admin of Therapeutic Inj  intramuscular or subcutaneous (29562)  Patient Instructions: 1)  Please schedule a follow-up appointment in 1 month. 2)  BMP prior to visit, ICD-9: stat today. 3)  You will get an injection for nausea in the office 4)  Ensure you keep your fluid intake up. 5)  BP is 140/90, no med changeat this time. Prescriptions: FLUCONAZOLE 150 MG TABS (FLUCONAZOLE) Take 1 tablet by mouth once a day  #5 x 0   Entered and Authorized by:   Syliva Overman MD   Signed by:   Syliva Overman MD on 01/17/2010   Method used:   Printed then faxed to ...       [facility] [address] [facility] (retail)       [address]       [place], [place]  [address]       Ph: [phone]       Fax: [phone]   RxID:   [id number] FLUCONAZOLE 150 MG TABS (FLUCONAZOLE) Take 1 tablet by mouth once a day  #3 x 0   Entered and Authorized by:   Syliva Overman MD   Signed by:   Syliva Overman MD on 01/17/2010   Method used:   Electronically to        [facility]  [address] (retail)       [address]       [place], [place]  [address]       Ph:  [phone]       Fax: [phone]   RxID:   [id number]    Medication Administration  Injection # 1:    Medication: Zofran 1mg . injection    Diagnosis: NAUSEA (ICD-787.02)    Route: IM    Site: LUOQ gluteus    Exp Date: 04/13    Lot #: [id number]    Mfr: [email or web address]    Comments: zofran 4mg     Patient tolerated injection without complications    Given by: Adella Hare LPN (January 17, 2010 9:27 AM)  Injection # 3:    Medication: Insulin 5 units    Diagnosis: DIABETES MELLITUS, TYPE II (ICD-250.00)    Route: SQ    Exp Date: 11/13    Lot #: [id number]    Mfr: novo nordisk    Patient tolerated injection without complications    Given by: Adella Hare LPN (January 17, 2010 9:28 AM)  Orders Added: 1)  Est. Patient Level IV [93818] 2)  T-Basic Metabolic Panel [[id number]] 3)  Zofran 1mg . injection [J2405] 4)  Admin of Therapeutic Inj  intramuscular or subcutaneous [96372] 5)  Insulin 5 units [J1815] 6)  Glucose, (CBG) [82962]    Laboratory Results   Blood Tests   Date/Time Received: January 17, 2010 9:30 AM  Date/Time Reported: January 17, 2010 9:30 AM   CBG Random:: 331

## 2010-05-16 NOTE — Progress Notes (Signed)
Summary: dizzy  Phone Note Call from Patient   Summary of Call: last night dizzy and weak and couldn't hardley get around. took blood pressure and it was 115/73. thinks has something to do with new pill. [phone] Initial call taken by: Rudene Anda,  July 25, 2009 4:36 PM  Follow-up for Phone Call        returned call, left message   if patient calls back offer appt with dawn today Follow-up by: Adella Hare LPN,  July 26, 2009 11:48 AM  Additional Follow-up for Phone Call Additional follow up Details #1::        appt with dawn tomorrow morning Additional Follow-up by: Adella Hare LPN,  July 26, 2009 11:52 AM

## 2010-05-16 NOTE — Letter (Signed)
Summary: 1st Missed Appt.  [facility]  [address]   [place], [place] [address]   Phone: [phone]  Fax: [phone]    May 26, 2009  MRN: [id number]  MARKEITA ALICIA [address] [place], [place]  [address]  Dear Ms. Augenstein,  At [facility], we make every attempt to fit patients into our schedule by reserving several appointment slots for same-day appointments.  However, we cannot always make appointments for patients the same day they are calling.  At the end of the day, we look back at our schedule and find that because of last-minute cancellations and patients not showing up for their scheduled appointments, we have several appointment slots that are left open and could have been used by another person who really needed it.  In the past, you may have been one of the patients who could not get in when you needed to.  But recently, you were one of the patients with an appointment that you didn't show up for or canceled too late for [name] to fill it.  We choose not to charge no-show or last minute cancellation fees to our patients, like many other offices do.  We do not wish to institute that policy and hope we never have to.  However, we kindly request that you assist [name] by providing at least 24 hours' notice if you can't make your appointment.  If no-shows or late cancellations become habitual (i.e. Three or more in a one-year period), we may terminate the physician-patient relationship.    Thank you for your consideration and cooperation.   Altamease Oiler

## 2010-05-16 NOTE — Letter (Signed)
Summary: Work Writer at [facility] [employer]  [address] [place] [address]   Phone: [phone]  Fax: [phone]     December 12, 2009    [facility]   The above named patient had a medical visit today at:    10:30  am / pm.  Please take this into consideration when reviewing the time away from work/school.      Sincerely yours,  Architectural technologist

## 2010-05-16 NOTE — Letter (Signed)
Summary: BP CUFF  BP CUFF   Imported By: Lind Guest 01/31/2010 14:40:25  _____________________________________________________________________  External Attachment:    Type:   Image     Comment:   External Document

## 2010-05-16 NOTE — Progress Notes (Signed)
  Phone Note Call from Patient   Summary of Call: On Call Note:  12-01-09 12:19 am  Ph [phone].  Called twice and got voice mail both times.  No one picked up.                                     12:33 am  Ph [phone].  Called pt.  States BP 130 something over 94.  Has been running about that all day.  "I haven't been drinking or doing anything I shouldn't."  Advised pt that she can call the office in the morning.  That her BP is not dangerously elevated and no action needs to be taken tonight. Initial call taken by: Esperanza Sheets PA,  December 01, 2009 1:45 PM

## 2010-05-16 NOTE — Progress Notes (Signed)
Summary: k is low   Phone Note Call from Patient Call back at [phone]   Caller: pt Reason for Call: Talk to Nurse Summary of Call: pt was seen in ed yesterday and told that her K was low should she start back on meds? Initial call taken by: Faythe Ghee,  December 30, 2009 11:55 AM  Follow-up for Phone Call        S: pt was seen in the ED 12/29/09 B: K+ 2.6 A: would you like to start pt on a K+ supplement, pt was formerly on KCL 20 mEq once daily, stop 5/11 R:  Follow-up by: Teressa Lower RN,  December 30, 2009 2:04 PM  Additional Follow-up for Phone Call Additional follow up Details #1::        Flag sent to Dr. Lodema Hong requesting that she followup on this issue. Additional Follow-up by: Kathlen Brunswick, MD, [facility],  January 02, 2010 10:42 AM     Appended Document: k is low pls call pt, she needs to get chem 7 in the morning asaasta, then we will call in the early afternoon t see if she needs extra potassium  Appended Document: k is low pt to be seen in the office instaed

## 2010-05-16 NOTE — Progress Notes (Signed)
  Phone Note Call from Patient   Caller: Patient Summary of Call: having pains in legs and hands and arms, can you send in ibuprofen 800mg   [facility] Initial call taken by: Adella Hare LPN,  February 28, 2010 4:29 PM  Follow-up for Phone Call        med sent in pt aware Follow-up by: Syliva Overman MD,  February 28, 2010 5:36 PM    New/Updated Medications: IBUPROFEN 800 MG TABS (IBUPROFEN) one twice daily  for 5 days, then as needed for pain Prescriptions: IBUPROFEN 800 MG TABS (IBUPROFEN) one twice daily  for 5 days, then as needed for pain  #30 x 0   Entered and Authorized by:   Syliva Overman MD   Signed by:   Syliva Overman MD on 02/28/2010   Method used:   Electronically to        [facility]  [address] (retail)       [address]       [place], [place]  [address]       Ph: [phone]       Fax: [phone]   RxID:   [id number]

## 2010-05-16 NOTE — Progress Notes (Signed)
Summary: fluid  ankles swollen  Phone Note Call from Patient   Summary of Call: gaining weight and not eating good and she went to foot dr and he said that her ankles are swollen and she thinks it maybe fluid she wants appt we do not have one  she is tired and weak  and wanst to see if you can put her in i offered for 1 tuesday but she is scared about the fluid  Initial call taken by: Lind Guest,  March 16, 2010 4:32 PM  Follow-up for Phone Call        no earlier appts are available unfortunately, nothing else can be done Follow-up by: Syliva Overman MD,  March 16, 2010 5:19 PM

## 2010-05-16 NOTE — Assessment & Plan Note (Signed)
Summary: BP/nausea- room 2   Vital Signs:  Patient profile:   45 year old female Menstrual status:  irregular Height:      64 inches Weight:      184 pounds BMI:     31.70 O2 Sat:      98 % on Room air Pulse rate:   84 / minute Resp:     16 per minute BP sitting:   140 / 90  (left arm)  Vitals Entered By: Adella Hare LPN (December 06, 2009 10:00 AM)  Serial Vital Signs/Assessments:  Time      Position  BP       Pulse  Resp  Temp     By                     114/90                         Esperanza Sheets PA  CC: patient states bp been running high and having nausea Is Patient Diabetic? Yes Pain Assessment Patient in pain? no      Comments did not bring meds to ov   Referring Ludmila Ebarb:  [facility]  Primary Fenton Candee:  Dr. Syliva Overman  CC:  patient states bp been running high and having nausea.  History of Present Illness: Pt presents today with c/o still having daily nausea.  Also has frequent fluid brash.  Is still taking Omeprazole two times a day.  Is due for her check up with Dr Jena Gauss and does not have an appt.  BMs watery x 3-4 days.  No blood or melena.  + intermittent abd pains too.  BP has been running higher than nl.  At home 148/98, 139/94.  Higher at night and better during the day. Is seeing cardiology and has an appt for heart monitor.  Psych has decreased Geodon.  She is having difficulty sleeping without it. Psych prescribed Lorazepam but states it makes her too groggy and sleepy.  Is wondering if I can prescription something for sleep for her. Pt states she quit ETOH 2 weeks ago.    Allergies (verified): 1)  ! Pcn  Past History:  Past medical history reviewed for relevance to current acute and chronic problems.  Past Medical History: Reviewed history from 09/27/2009 and no changes required. ASCVD: Minimal at cath in 09/2005; negative pharm. stress nuclear study in 8/08 with nl EF; neg. stress echo'-10 AODM-no insulin; onset 2000 HYPERLIPIDEMIA  (ICD-272.4) HYPERTENSION (ICD-401.9) Tobacco abuse: 30 pack years Gastroesophageal reflux disease; Schatzki's ring Iron deficiency anemia                                                                                                                                                   DEPRESSION (ICD-311) ALCOHOL ABUSE (ICD-305.00) OBESITY (ICD-278.00)  Review  of Systems CV:  Denies chest pain or discomfort. Resp:  Denies shortness of breath. GI:  Complains of abdominal pain, change in bowel habits, indigestion, and nausea; denies bloody stools and dark tarry stools.  Physical Exam  General:  Well-developed,well-nourished,in no acute distress; alert,appropriate and cooperative throughout examination Head:  Normocephalic and atraumatic without obvious abnormalities. No apparent alopecia or balding. Ears:  External ear exam shows no significant lesions or deformities.  Otoscopic examination reveals clear canals, tympanic membranes are intact bilaterally without bulging, retraction, inflammation or discharge. Hearing is grossly normal bilaterally. Nose:  External nasal examination shows no deformity or inflammation. Nasal mucosa are pink and moist without lesions or exudates. Mouth:  Oral mucosa and oropharynx without lesions or exudates.   Neck:  No deformities, masses, or tenderness noted. Lungs:  Normal respiratory effort, chest expands symmetrically. Lungs are clear to auscultation, no crackles or wheezes. Heart:  Normal rate and regular rhythm. S1 and S2 normal without gallop, murmur, click, rub or other extra sounds. Abdomen:  Bowel sounds positive,abdomen soft and non-tender without masses, organomegaly or hernias noted. Cervical Nodes:  No lymphadenopathy noted Psych:  Cognition and judgment appear intact. Alert and cooperative with normal attention span and concentration. No apparent delusions, illusions, hallucinations   Impression & Recommendations:  Problem # 1:  NAUSEA  (ICD-787.02) Assessment Unchanged  Her updated medication list for this problem includes:    Loratadine 10 Mg Tabs (Loratadine) ..... One tab by mouth once daily    Meclizine Hcl 12.5 Mg Tabs (Meclizine hcl) .Marland Kitchen... Take 1 tablet by mouth three times a day as needed for vertigo/dizziness    Ondansetron Hcl 4 Mg Tabs (Ondansetron hcl) .Marland Kitchen... Take 1 every 8 hrs as needed for nausea  Orders: Zofran 1mg . injection (G2952) Admin of Therapeutic Inj  intramuscular or subcutaneous (84132) Gastroenterology Referral (GI)  Problem # 2:  GASTROESOPHAGEAL REFLUX DISEASE (ICD-530.81) Assessment: Deteriorated  Her updated medication list for this problem includes:    Omeprazole 20 Mg Tbec (Omeprazole) .Marland Kitchen..Marland Kitchen Two times a day  Orders: Gastroenterology Referral (GI)  Problem # 3:  HYPERTENSION (ICD-401.9) Assessment: Comment Only  Her updated medication list for this problem includes:    Atenolol 25 Mg Tabs (Atenolol) .Marland Kitchen... Take 1 tablet by mouth once a day    Hydrochlorothiazide 25 Mg Tabs (Hydrochlorothiazide) ..... One tab by mouth once daily  Problem # 4:  INSOMNIA (ICD-780.52) Assessment: New Advised pt that she would have to f/u with psych regarding her meds and prescription for sleep since they are dealing with this issue and have been making med changes in regards to this.  Complete Medication List: 1)  Divalproex Sodium 500 Mg Tbec (Divalproex sodium) .... 2 tabs at bedtime 2)  Atenolol 25 Mg Tabs (Atenolol) .... Take 1 tablet by mouth once a day 3)  Ferrous Sulfate 325 (65 Fe) Mg Tabs (Ferrous sulfate) .... Take 1 tablet by mouth two times a day 4)  Lexapro 20 Mg Tabs (Escitalopram oxalate) .Marland Kitchen.. 1 tablet by mouth in mornings 5)  Hydrochlorothiazide 25 Mg Tabs (Hydrochlorothiazide) .... One tab by mouth once daily 6)  Loratadine 10 Mg Tabs (Loratadine) .... One tab by mouth once daily 7)  Metformin Hcl 1000 Mg Tabs (Metformin hcl) .... Take 1 tablet by mouth two times a day 8)  Lovastatin  20 Mg Tabs (Lovastatin) .... 2 tablets by mouth at bedtime 9)  Nitrostat 0.4 Mg Subl (Nitroglycerin) .... Sublingualy every 5 min as needed, max 3 tabs in 5 mn. if  no improvement call 911 10)  Klor-con M20 20 Meq Cr-tabs (Potassium chloride crys cr) .... Take 1 tablet by mouth two times a day 11)  Omeprazole 20 Mg Tbec (Omeprazole) .... Two times a day 12)  Accu Chek Aviva Strips and Lancets  .... For once daily testing 13)  Fish Oil 300 Mg Caps (Omega-3 fatty acids) .... Take 1 tablet by mouth once a day 14)  Meclizine Hcl 12.5 Mg Tabs (Meclizine hcl) .... Take 1 tablet by mouth three times a day as needed for vertigo/dizziness 15)  Lorazepam 2 Mg Tabs (Lorazepam) .... One half to one tab by mouth at bedtime 16)  Tramadol Hcl 50 Mg Tabs (Tramadol hcl) .... Take 1 every 6 hrs as needed for pain 17)  Flonase 50 Mcg/act Susp (Fluticasone propionate) .... 2 puffs per nostril daily 18)  Tylenol Arthritis Pain 650 Mg Cr-tabs (Acetaminophen) .... One twice daily for 5 days 19)  Ondansetron Hcl 4 Mg Tabs (Ondansetron hcl) .... Take 1 every 8 hrs as needed for nausea  Patient Instructions: 1)  Keep your next appt with Dr Lodema Hong. 2)  Continue your current blood pressure medicine. 3)  I am referring you back to Dr Jena Gauss. 4)  I have prescribed medicine for nausea.  You have also received a shot in the office. 5)  Follow up with your psychiatrist about your medications and insomnia. Prescriptions: ONDANSETRON HCL 4 MG TABS (ONDANSETRON HCL) take 1 every 8 hrs as needed for nausea  #15 x 0   Entered and Authorized by:   Esperanza Sheets PA   Signed by:   Esperanza Sheets PA on 12/06/2009   Method used:   Electronically to        [facility]  [address] (retail)       [address]       [place], [place]  [address]       Ph: [phone]       Fax: [phone]   RxID:   [id number]    Medication Administration  Injection # 1:    Medication: Zofran 1mg . injection    Diagnosis: NAUSEA  (ICD-787.02)    Route: IM    Site: RUOQ gluteus    Exp Date: 10/12    Lot #: [id number]    Mfr: [email or web address]    Comments: zofran 4mg  given    Patient tolerated injection without complications    Given by: Adella Hare LPN (December 06, 2009 10:52 AM)  Orders Added: 1)  Zofran 1mg . injection [J2405] 2)  Admin of Therapeutic Inj  intramuscular or subcutaneous [96372] 3)  Gastroenterology Referral [GI] 4)  Est. Patient Level IV [40102]

## 2010-05-16 NOTE — Assessment & Plan Note (Signed)
Summary: OV   Vital Signs:  Patient profile:   45 year old female Menstrual status:  irregular LMP:     09/25/2009 Height:      64 inches Weight:      192.25 pounds O2 Sat:      99 % on 92 BP sitting:   130 / 70  (right arm) CC: yeast infection that she can't get rid of, she has had it for a couple of weeks now, she tried otc medication last dose was about 3 days ago she states she has been using condoms when having sex, not sure if she is allergic to that or what.room 2 Is Patient Diabetic? Yes Did you bring your meter with you today? No LMP (date): 09/25/2009     Enter LMP: 09/25/2009 Last PAP Result NEGATIVE FOR INTRAEPITHELIAL LESIONS OR MALIGNANCY.   Referring Provider:  [facility]  Primary Provider:  Dr. Syliva Overman  CC:  yeast infection that she can't get rid of, she has had it for a couple of weeks now, she tried otc medication last dose was about 3 days ago she states she has been using condoms when having sex, and not sure if she is allergic to that or what.room 2.  History of Present Illness: Pt presents today with c/o genital itching x approx 2 weeks.  Not much discharge, but sometimes notices a little with wiping.  No new partners.  And uses condoms faithfully with her current partner.   Pt also states she needs a refill of her chol med.  Labs UTD.  Her last OV for chronic problems was one mos ago & she is not due for f/u for another mos.  She is considering getting married and having another child. She asks if she can get pregnant having diabetes.  Discussed with pt that she would need to be off of many of the meds on her med list before conceiving.  That she needs to discuss this with her OB/GYN before trying to conceive.    Allergies: 1)  ! Pcn  Past History:  Past medical history reviewed for relevance to current acute and chronic problems.  Past Medical History: Reviewed history from 09/27/2009 and no changes required. ASCVD: Minimal at cath in  09/2005; negative pharm. stress nuclear study in 8/08 with nl EF; neg. stress echo'-10 AODM-no insulin; onset 2000 HYPERLIPIDEMIA (ICD-272.4) HYPERTENSION (ICD-401.9) Tobacco abuse: 30 pack years Gastroesophageal reflux disease; Schatzki's ring Iron deficiency anemia                                                                                                                                                   DEPRESSION (ICD-311) ALCOHOL ABUSE (ICD-305.00) OBESITY (ICD-278.00)  Review of Systems GU:  Complains of discharge; denies abnormal vaginal bleeding, dysuria, and urinary frequency.  Physical Exam  General:  Well-developed,well-nourished,in no acute  distress; alert,appropriate and cooperative throughout examination Head:  Normocephalic and atraumatic without obvious abnormalities. No apparent alopecia or balding. Genitalia:  normal introitus, no external lesions, mucosa pink and moist, no vaginal or cervical lesions, no friaility or hemorrhage, normal uterus size and position, and no adnexal masses or tenderness.  Very small amt of thick white vag mucus noted. Skin:  Intact without suspicious lesions or rashes Psych:  Cognition and judgment appear intact. Alert and cooperative with normal attention span and concentration. No apparent delusions, illusions, hallucinations   Impression & Recommendations:  Problem # 1:  VAGINITIS (ICD-616.10) Assessment New  Orders: T-Culture, Genital ([id number]) - cancelled Chlamydia and GC Probe Amp, genital ([id number]) - ordered incorrectly T-Wet Prep by Molecular Probe [phone]) T-Chlamydia & GC Probe, Genital ([id number])  Problem # 2:  HYPERLIPIDEMIA (ICD-272.4) Assessment: Comment Only  Her updated medication list for this problem includes:    Lovastatin 20 Mg Tabs (Lovastatin) .Marland Kitchen... 2 tablets by mouth at bedtime  Complete Medication List: 1)  Divalproex Sodium 500 Mg Tbec (Divalproex sodium) .... 2 tabs at bedtime 2)   Atenolol 25 Mg Tabs (Atenolol) .... Take 1 tablet by mouth once a day 3)  Ferrous Sulfate 325 (65 Fe) Mg Tabs (Ferrous sulfate) .... Take 1 tablet by mouth two times a day 4)  Geodon 80 Mg Caps (Ziprasidone hcl) .... Take two tablets at bedtime 5)  Lexapro 20 Mg Tabs (Escitalopram oxalate) .Marland Kitchen.. 1 tablet by mouth in mornings 6)  Hydrochlorothiazide 25 Mg Tabs (Hydrochlorothiazide) .... One tab by mouth once daily 7)  Loratadine 10 Mg Tabs (Loratadine) .... One tab by mouth once daily 8)  Metformin Hcl 1000 Mg Tabs (Metformin hcl) .... Take 1 tablet by mouth two times a day 9)  Lovastatin 20 Mg Tabs (Lovastatin) .... 2 tablets by mouth at bedtime 10)  Nitrostat 0.4 Mg Subl (Nitroglycerin) .... Sublingualy every 5 min as needed, max 3 tabs in 5 mn. if no improvement call 911 11)  Klor-con M20 20 Meq Cr-tabs (Potassium chloride crys cr) .... Take 1 tablet by mouth two times a day 12)  Omeprazole 20 Mg Tbec (Omeprazole) .... Two times a day 13)  Glucotrol Xl 2.5 Mg Xr24h-tab (Glipizide) .... Take 1 tablet by mouth once a day 14)  Accu Chek Aviva Strips and Lancets  .... For once daily testing 15)  Geodon 40 Mg Caps (Ziprasidone hcl) .Marland Kitchen.. 1 tablet by mouth at bedtime 16)  Trazodone Hcl 100 Mg Tabs (Trazodone hcl) .Marland Kitchen.. 1 tablet by mouth at bedtime as needed 17)  Glucotrol Xl 2.5 Mg Xr24h-tab (Glipizide) .Marland Kitchen.. 1 tablet by mouth once daily 18)  Geodon 40 Mg Caps (Ziprasidone hcl) .Marland Kitchen.. 1 tablet by mouth once daily 19)  Glipizide Xl 2.5 Mg Xr24h-tab (Glipizide) .... One tab by mouth once daily 20)  Fluconazole 150 Mg Tabs (Fluconazole) .... Take 1 by mouth  Patient Instructions: 1)  Please schedule a follow-up appointment as needed for this current problem.  Follow up for your other medical problems as advised by Dr Lodema Hong. 2)  I will notify you of your culture results when available. 3)  I have prescribed a medication for yeast infection. Prescriptions: LOVASTATIN 20 MG TABS (LOVASTATIN) 2 tablets by  mouth at bedtime  #60 x 3   Entered and Authorized by:   Esperanza Sheets PA   Signed by:   Esperanza Sheets PA on 10/18/2009   Method used:   Electronically to        [facility]   [address] (retail)       [address]       [place], [place]  [address]       Ph: [phone]       Fax: [phone]   RxID:   [id number] FLUCONAZOLE 150 MG TABS (FLUCONAZOLE) take 1 by mouth  #1 x 0   Entered and Authorized by:   Esperanza Sheets PA   Signed by:   Esperanza Sheets PA on 10/18/2009   Method used:   Electronically to        [facility]  [address] (retail)       [address]       [place], [place]  [address]       Ph: [phone]       Fax: [phone]   RxID:   [id number]

## 2010-05-16 NOTE — Progress Notes (Signed)
  Phone Note Outgoing Call   Summary of Call: Advise pt that we received a fax from [facility] pharmacy stating that the nausea med I prescribed yesterday is not covered by her ins.  I have changed her prescription to phenergan.  She has taken this before.  She needs to be aware though that when she takes it, it can make her drowsy.   Initial call taken by: Esperanza Sheets PA,  December 07, 2009 8:17 AM  Follow-up for Phone Call        called patient, no answer Follow-up by: Adella Hare LPN,  December 07, 2009 9:40 AM  Additional Follow-up for Phone Call Additional follow up Details #1::        called patient, no answer Additional Follow-up by: Adella Hare LPN,  December 08, 2009 11:13 AM    New/Updated Medications: PROMETHAZINE HCL 12.5 MG TABS (PROMETHAZINE HCL) take 1 every 8 hrs as needed for nausea Prescriptions: PROMETHAZINE HCL 12.5 MG TABS (PROMETHAZINE HCL) take 1 every 8 hrs as needed for nausea  #15 x 0   Entered and Authorized by:   Esperanza Sheets PA   Signed by:   Esperanza Sheets PA on 12/07/2009   Method used:   Electronically to        [facility]  [address] (retail)       [address]       [place], [place]  [address]       Ph: [phone]       Fax: [phone]   RxID:   [id number]

## 2010-05-16 NOTE — Assessment & Plan Note (Signed)
Summary: face swelling- room 1   Vital Signs:  Patient profile:   45 year old female Menstrual status:  irregular O2 Sat:      98 % on Room air Pulse rate:   96 / minute Resp:     16 per minute BP sitting:   110 / 80  (left arm)  Vitals Entered By: Adella Hare LPN (December 14, 2009 1:34 PM) CC: face swelling Is Patient Diabetic? Yes Did you bring your meter with you today? No Pain Assessment Patient in pain? no        Referring Provider:  [facility]  Primary Provider:  Dr. Syliva Overman  CC:  face swelling.  History of Present Illness: Pt thinks she is having an allergic rxn to Metronidazole prescribed for suspected bacterial vaginosis.  She awoke this am itchy all over.  No rash or hives.  She has taken Benadryl and is helping with the itching.  She also felt like the cheeks of her face under her eyes were swollen this morning.  This feels like it has gone down some too.  No difficulty breathing or dysphagia.  No swelling of tongue or throat.  Pt had taken 1 day only of Metronidazole for vag odor, suspected BV.  No other new meds, foods, etc.   Allergies (verified): 1)  ! Pcn 2)  ! Flagyl  Review of Systems Resp:  Denies shortness of breath and wheezing. Derm:  Complains of itching; denies rash. Allergy:  Denies hives or rash.  Physical Exam  General:  Well-developed,well-nourished,in no acute distress; alert,appropriate and cooperative throughout examination Head:  Normocephalic and atraumatic without obvious abnormalities. No apparent alopecia or balding.  No swelling noted in face though pt reports swelling. Ears:  External ear exam shows no significant lesions or deformities.  Otoscopic examination reveals clear canals, tympanic membranes are intact bilaterally without bulging, retraction, inflammation or discharge. Hearing is grossly normal bilaterally. Nose:  External nasal examination shows no deformity or inflammation. Nasal mucosa are pink and moist without  lesions or exudates. Mouth:  Oral mucosa and oropharynx without lesions or exudates.   Neck:  No deformities, masses, or tenderness noted. Lungs:  Normal respiratory effort, chest expands symmetrically. Lungs are clear to auscultation, no crackles or wheezes. Heart:  Normal rate and regular rhythm. S1 and S2 normal without gallop, murmur, click, rub or other extra sounds. Skin:  Intact without suspicious lesions or rashes Cervical Nodes:  No lymphadenopathy noted Psych:  Cognition and judgment appear intact. Alert and cooperative with normal attention span and concentration. No apparent delusions, illusions, hallucinations   Impression & Recommendations:  Problem # 1:  ALLERGIC REACTION (ICD-995.3) Assessment New Chart noted of allergy to metronidazole.  Advised pt to add this to her allergies if she sees another provider, ER, etc. Continue Benadryl as needed for itching. Depo Medrol 80mg  IM.  Problem # 2:  VAGINITIS (ICD-616.10) Assessment: Comment Only Suspected BV.  The following medications were removed from the medication list:    Metronidazole 500 Mg Tabs (Metronidazole) .Marland Kitchen... Take 1 two times a day x 7 days Her updated medication list for this problem includes:    Cleocin 100 Mg Supp (Clindamycin phosphate) ..... Insert 1 vaginally at bedtime x 3 nights  Complete Medication List: 1)  Divalproex Sodium 500 Mg Tbec (Divalproex sodium) .... 2 tabs at bedtime 2)  Atenolol 25 Mg Tabs (Atenolol) .... Take 1 tablet by mouth once a day 3)  Ferrous Sulfate 325 (65 Fe) Mg Tabs (Ferrous sulfate) .Marland KitchenMarland KitchenMarland Kitchen  Take 1 tablet by mouth two times a day 4)  Lexapro 20 Mg Tabs (Escitalopram oxalate) .Marland Kitchen.. 1 tablet by mouth in mornings 5)  Hydrochlorothiazide 25 Mg Tabs (Hydrochlorothiazide) .... One tab by mouth once daily 6)  Loratadine 10 Mg Tabs (Loratadine) .... One tab by mouth once daily 7)  Metformin Hcl 1000 Mg Tabs (Metformin hcl) .... Take 1 tablet by mouth two times a day 8)  Lovastatin 20  Mg Tabs (Lovastatin) .... 2 tablets by mouth at bedtime 9)  Nitrostat 0.4 Mg Subl (Nitroglycerin) .... Sublingualy every 5 min as needed, max 3 tabs in 5 mn. if no improvement call 911 10)  Klor-con M20 20 Meq Cr-tabs (Potassium chloride crys cr) .... Take 1 tablet by mouth two times a day 11)  Omeprazole 20 Mg Tbec (Omeprazole) .... Two times a day 12)  Accu Chek Aviva Strips and Lancets  .... For once daily testing 13)  Fish Oil 300 Mg Caps (Omega-3 fatty acids) .... Take 1 tablet by mouth once a day 14)  Meclizine Hcl 12.5 Mg Tabs (Meclizine hcl) .... Take 1 tablet by mouth three times a day as needed for vertigo/dizziness 15)  Lorazepam 2 Mg Tabs (Lorazepam) .... One half to one tab by mouth at bedtime 16)  Tramadol Hcl 50 Mg Tabs (Tramadol hcl) .... Take 1 every 6 hrs as needed for pain 17)  Flonase 50 Mcg/act Susp (Fluticasone propionate) .... 2 puffs per nostril daily 18)  Tylenol Arthritis Pain 650 Mg Cr-tabs (Acetaminophen) .... One twice daily for 5 days 19)  Promethazine Hcl 12.5 Mg Tabs (Promethazine hcl) .... Take 1 every 8 hrs as needed for nausea 20)  Hemorrhoidal-hc 25 Mg Supp (Hydrocortisone acetate) .... Insert 1 rectally two times a day x 7 days 21)  Cleocin 100 Mg Supp (Clindamycin phosphate) .... Insert 1 vaginally at bedtime x 3 nights  Other Orders: Depo- Medrol 80mg  (J1040) Admin of Therapeutic Inj  intramuscular or subcutaneous (27253)  Patient Instructions: 1)  Please schedule a follow-up appointment as needed. 2)  Continue Benadryl as needed for itching. 3)  You have received Depo Medrol today to help with your allergic reaction. 4)  Discontinue Metronidazole. 5)  I have prescribed Cleocin vaginal to use at bedtime for 3 nights. Prescriptions: CLEOCIN 100 MG SUPP (CLINDAMYCIN PHOSPHATE) insert 1 vaginally at bedtime x 3 nights  #3 x 0   Entered and Authorized by:   Esperanza Sheets PA   Signed by:   Esperanza Sheets PA on 12/14/2009   Method used:   Electronically to          [facility]  [address] (retail)       [address]       [place], [place]  [address]       Ph: [phone]       Fax: [phone]   RxID:   [id number]    Medication Administration  Injection # 1:    Medication: Depo- Medrol 80mg     Diagnosis: OTHER DRUG ALLERGY (ICD-995.27)    Route: IM    Site: LUOQ gluteus    Exp Date: 6/12    Lot #: [id number]    Mfr: Pharmacia    Patient tolerated injection without complications    Given by: Adella Hare LPN (December 14, 2009 1:54 PM)  Orders Added: 1)  Depo- Medrol 80mg  [J1040] 2)  Admin of Therapeutic Inj  intramuscular or subcutaneous [96372] 3)  Est. Patient  Level III [[address]]

## 2010-05-16 NOTE — Progress Notes (Signed)
Summary: PT HAVING HARD TIME BREATHING    Phone Note Call from Patient Call back at [phone]   Reason for Call: Talk to Nurse Summary of Call: PT WAS TAKEN OFF HER FLUID PILL AND WANTS TO KNOW IF IT IS POSSIBLE THAT SHE HAS FLUID IN HER LUNGS. SHE HAS BEEN COUGING ALL THE TIME AND HAVING A HARD TIME BREATHING. Initial call taken by: Faythe Ghee,  December 28, 2009 9:30 AM  Follow-up for Phone Call        pt felt that the sob was coming from the smoking, I mailed out a packet on smoking cessation Follow-up by: Teressa Lower RN,  December 28, 2009 1:28 PM

## 2010-05-16 NOTE — Assessment & Plan Note (Signed)
Summary: Pulmonary/ acute ext ov hfa 50%     Copy to:  [facility]  Primary Provider/Referring Provider:  Dr. Syliva Overman  CC:  follow up visit-started smoking again about 2 weeks ago.Has been on Chantix and patch before.Marland Kitchen  History of Present Illness: 32 yobf quit smoking 12/2009 when admit to [facility] then transferred to   [facility] x 7 days resp distress with bilateral effusions c/w CAP ? viral   January 16, 2010  1st pulmonary office eval Post hospital followup. cc  weakness.  She states that her breathing has overall been doing well.   still having some orthopnea using 2-3 pillows no cough. rec Ok to use zyrtec 10 mg one daily for nasal congestion No smoking As long as breathing is normal there is no need to return to pulmonary clinic, the most important aspect of your care is to maintain off cigarettes Diflucan 100 mg take this evening  x one dose only  Went to [facility] ER Feb 02, 2010 for sob dxd uti > rx cephelxin  February 10, 2010 ov started smoking then cc increasing sob just with walking 50 ft, no cough or subj wheeze but better p "my inhaler"  (actually has 2 , neither disclosed, both albuterol).  Pt denies any significant sore throat, dysphagia, itching, sneezing,  nasal congestion or excess secretions,  fever, chills, sweats, unintended wt loss, pleuritic or exertional cp, hempoptysis, orthopnea pnd or leg swelling Pt also denies any obvious fluctuation in symptoms with weather or environmental change or other alleviating or aggravating factors.        Preventive Screening-Counseling & Management  Alcohol-Tobacco     Smoking Status: current-started 2 weeks ago     Packs/Day: .25  Current Medications (verified): 1)  Metformin Hcl 1000 Mg Tabs (Metformin Hcl) .... Take 1 Tablet By Mouth Two Times A Day 2)  Lovastatin 20 Mg Tabs (Lovastatin) .... 2 Tablets By Mouth At Bedtime 3)  Nitrostat 0.4 Mg Subl (Nitroglycerin) .... Sublingualy Every 5 Min As Needed, Max 3 Tabs in 5 Mn. If  No Improvement Call 911 4)  Ferrous Sulfate 325 (65 Fe) Mg Tabs (Ferrous Sulfate) .... Take 1 Tablet By Mouth Two Times A Day 5)  Megestrol Acetate 40 Mg Tabs (Megestrol Acetate) .... Take 1 Tablet By Mouth Once A Day 6)  Klor-Con M20 20 Meq Cr-Tabs (Potassium Chloride Crys Cr) .... Take 1 Tablet By Mouth Once A Day 7)  Metoprolol Tartrate 50 Mg Tabs (Metoprolol Tartrate) .... Take 1 Tablet By Mouth Two Times A Day 8)  Divalproex Sodium 500 Mg Tbec (Divalproex Sodium) .... Take 2 Tablets By Mouth At Bedtime 9)  Lexapro 20 Mg Tabs (Escitalopram Oxalate) .... Take 1 Tablet By Mouth Once A Day 10)  Hydrochlorothiazide 25 Mg Tabs (Hydrochlorothiazide) .... Take 1 Tablet By Mouth Once A Day 11)  Zyrtec Allergy 10 Mg  Tabs (Cetirizine Hcl) .... By Mouth Daily As Needed Itchy Sneezy Runny Nose 12)  Strips and Lancets For True Result Meter .... Three Times A Day Testing 13)  Glimepiride 2 Mg Tabs (Glimepiride) .... Take 1 Tablet By Mouth Two Times A Day 14)  Clotrimazole 1 % Crea (Clotrimazole) .... Aqpply Twice Daily To Feet For Rash and Cracking 15)  Fluconazole 150 Mg Tabs (Fluconazole) .... Take 1 Tablet By Mouth Once A Day 16)  Ranitidine Hcl 150 Mg Caps (Ranitidine Hcl) .... Take 1 Capsule By Mouth Two Times A Day Discontinue Omeprazole 17)  Nexium 40 Mg Cpdr (Esomeprazole  Magnesium) .Marland Kitchen.. 1 By Mouth Daily For Acid Reflux  Allergies (verified): 1)  ! Pcn 2)  ! Flagyl 3)  ! Sulfa  Past History:  Past Medical History: ASCVD: Minimal at cath in 09/2005; negative pharm. stress nuclear study in 8/08 with nl EF; neg. stress echo'-10 AODM-no insulin; onset 2000 HYPERLIPIDEMIA HYPERTENSION-during treatment treatment with Geodon Tobacco use : 30 pack years; continuing at one pack per day Gastroesophageal reflux disease; Schatzki's ring Iron deficiency anemia                                                                                                                                                    DEPRESSION (ICD-311) ALCOHOL ABUSE (ICD-305.00) OBESITY (ICD-278.00) Diastolic dysfunction 12/2009 ? asthma       - HFA 50% p coaching February 10, 2010   Social History: Smoking Status:  current-started 2 weeks ago Packs/Day:  .25  Vital Signs:  Patient profile:   45 year old female Menstrual status:  irregular Height:      64 inches Weight:      179.38 pounds BMI:     30.90 O2 Sat:      97 % on Room air Temp:     98.0 degrees F oral Pulse rate:   102 / minute BP sitting:   120 / 82  (right arm) Cuff size:   regular  Vitals Entered By: Reynaldo Minium CMA (February 10, 2010 11:08 AM)  O2 Flow:  Room air CC: follow up visit-started smoking again about 2 weeks ago.Has been on Chantix and patch before.   Physical Exam  Additional Exam:  wt 190 > 180 January 16, 2010 > 179 February 10, 2010  amb somber bf nad HEENT: nl dentition, turbinates, and orophanx. Nl external ear canals without cough reflex NECK :  without JVD/Nodes/TM/ nl carotid upstrokes bilaterally LUNGS: no acc muscle use, clear to A and P bilaterally without cough on insp or exp maneuvers CV:  RRR  no s3 or murmur or increase in P2, no edema  ABD:  soft and nontender with nl excursion in the supine position. No bruits or organomegaly, bowel sounds nl MS:  warm without deformities, calf tenderness, cyanosis or clubbing SKIN: warm and dry without lesions   NEURO: anxxious     Sodium                    138 mEq/L                   135-145   Potassium            [H]  5.8 mEq/L                   3.5-5.1   Chloride  104 mEq/L                   96-112   Carbon Dioxide            23 mEq/L                    19-32   Glucose              [H]  208 mg/dL                   78-29   BUN                       19 mg/dL                    5-62   Creatinine                1.0 mg/dL                   1.3-0.8   Calcium              [H]  10.6 mg/dL                  6.5-78.4   GFR                       76.40  mL/min                >60  Tests: (2) CBC Platelet w/Diff (CBCD)   White Cell Count          8.4 K/uL                    4.5-10.5   Red Cell Count            4.93 Mil/uL                 3.87-5.11   Hemoglobin                14.9 g/dL                   69.6-29.5   Hematocrit                44.2 %                      36.0-46.0   MCV                       89.7 fl                     78.0-100.0   MCHC                      33.6 g/dL                   28.4-13.2   RDW                  [H]  15.4 %                      11.5-14.6   Platelet Count            241.0 K/uL                  150.0-400.0  Neutrophil %              51.6 %                      43.0-77.0   Lymphocyte %              41.3 %                      12.0-46.0   Monocyte %                5.0 %                       3.0-12.0   Eosinophils%              1.4 %                       0.0-5.0   Basophils %               0.7 %                       0.0-3.0   Neutrophill Absolute      4.3 K/uL                    1.4-7.7   Lymphocyte Absolute       3.5 K/uL                    0.7-4.0   Monocyte Absolute         0.4 K/uL                    0.1-1.0  Eosinophils, Absolute                             0.1 K/uL                    0.0-0.7   Basophils Absolute        0.1 K/uL                    0.0-0.1  Tests: (3) B-Type Natiuretic Peptide (BNPR)  B-Type Natriuetic Peptide                             16.4 pg/mL                  0.0-100.0  Tests: (4) Sed Rate (ESR)   Sed Rate                  11 mm/hr                    0-22  CXR  Procedure date:  02/10/2010  Findings:      There is no evidence of acute cardiac or pulmonary process.  Impression & Recommendations:  Problem # 1:  DYSPNEA (ICD-786.05) Not able to reproduce this problem in office x 3 laps 115ft each with not distress or desat  DDX of  difficult airways managment all start with A and  include Adherence, Ace Inhibitors, Acid Reflux, Active Sinus Disease, Alpha 1 Antitripsin  deficiency, Anxiety masquerading as Airways dz,  ABPA,  allergy(esp in young), Aspiration (esp in elderly), Adverse effects of DPI,  Active  smokers, plus one B  = Beta blocker use..   and one C = CHF  ? adherence:  no correlation between her med bag and her med list .- I spent extra time with the patient today explaining optimal mdi  technique.  This improved from  25-50%  Active smoking:  see #2  ? chf > no evidence by bnp  Strongly suspect  anxiety and deconditioning here.  Problem # 2:  HYPERKALEMIA (ICD-276.7)  c hyperkalemia on KCl, turns out taking at twice rec dose. See instructions for specific recommendations      Problem # 3:  TOBACCO ABUSE (ICD-305.1)  I emphasized that although we never turn away smokers from the pulmonary clinic, we do ask that they understand that the recommendations that were made won't work nearly as well in the presence of continued cigarette exposure and we may reach a point where we can't help the patient if he/she can't quit smoking.    Other Orders: T-D-Dimer Fibrin Derivatives Quantitive [id number]) T-2 View CXR (71020TC) Est. Patient Level IV (99214) Pulse Oximetry, Ambulatory (41660) TLB-BMP (Basic Metabolic Panel-BMET) (80048-METABOL) TLB-CBC Platelet - w/Differential (85025-CBCD) TLB-BNP (B-Natriuretic Peptide) (83880-BNPR) TLB-Sedimentation Rate (ESR) (85652-ESR)  Patient Instructions: 1)  Stop smoking before it stops you 2)  ok to use your proaire  up to 4x daily if needed for short of breath 3)  we will call you the results of your studies 4)  LATE ADD:  Stop KCL x 2 days then restart at half the previous dose 5)  D-Dimer not done last ov, will ask it be redrawn February 15, 2010   Prevention & Chronic Care Immunizations   Influenza vaccine: Historical  (01/08/2010)    Tetanus booster: 12/08/2009: Tdap    Pneumococcal vaccine: Pneumovax  (12/13/2006)  Other Screening   Pap smear: NEGATIVE FOR INTRAEPITHELIAL LESIONS OR  MALIGNANCY.  (03/08/2009)   Pap smear due: 11/2008    Mammogram: ASSESSMENT: Negative - BI-RADS 1^MM DIGITAL SCREENING  (02/15/2009)   Smoking status: current-started 2 weeks ago  (02/10/2010)  Diabetes Mellitus   HgbA1C: 7.2  (09/28/2009)    Eye exam: Not documented    Foot exam: yes  (11/04/2009)   High risk foot: Not documented   Foot care education: Not documented    Urine microalbumin/creatinine ratio: 4.5  (09/23/2009)  Lipids   Total Cholesterol: 156  (09/28/2009)   LDL: 61  (09/28/2009)   LDL Direct: Not documented   HDL: 28  (09/28/2009)   Triglycerides: 335  (09/28/2009)    SGOT (AST): 8  (09/28/2009)   SGPT (ALT): 8  (09/28/2009)   Alkaline phosphatase: 72  (09/28/2009)   Total bilirubin: 0.3  (09/28/2009)  Hypertension   Last Blood Pressure: 120 / 82  (02/10/2010)   Serum creatinine: 0.79  (01/30/2010)   Serum potassium 4.5  (01/30/2010)  Self-Management Support :    Diabetes self-management support: Not documented    Hypertension self-management support: Not documented    Lipid self-management support: Not documented     Immunization History:  Influenza Immunization History:    Influenza:  historical (01/08/2010)      Ambulatory Pulse Oximetry  Resting; HR_160   __    02 Sat_ 98_  Lap1 (185 feet)   HR_113_   02 Sat__96__ Lap2 (185 feet)   HR_118_   02 Sat_97_    Lap3 (185 feet)   HR_127_   02 Sat_96_  _X_Test Completed without Difficulty ___Test Stopped due to:    Reynaldo Minium CMA  February 10, 2010 12:02 PM

## 2010-05-16 NOTE — Progress Notes (Signed)
Summary: prilosec not working  Phone Note Call from Patient Call back at [phone]   Caller: Patient Summary of Call: pt called- wants to know if we can call in something stronger for her heartburn/reflux symptoms. She stated her prilosec is not working. Her other preferred drug is Nexium and she has never tried it.  please advise. pt uses [facility]/ Initial call taken by: Hendricks Limes LPN,  February 06, 2010 4:26 PM     Appended Document: prilosec not working    Prescriptions: NEXIUM 40 MG CPDR (ESOMEPRAZOLE MAGNESIUM) 1 by mouth daily for acid reflux  #31 x 11   Entered and Authorized by:   Joselyn Arrow FNP-BC   Signed by:   Joselyn Arrow FNP-BC on 02/07/2010   Method used:   Electronically to        [facility]  [address] (retail)       [address]       [place], [place]  [address]       Ph: [phone]       Fax: [phone]   RxID:   [id number]

## 2010-05-16 NOTE — Letter (Signed)
Summary: Radiology Test Reminder  [facility]  [address]   [place], [place] [address]   Phone: [phone]  Fax: [phone]     February 01, 2010   CHANLEY MCENERY [address] [place], [place]  [address] 18-Oct-1965  Dear Ms. Boyko,  During your last appointment, your doctor requested you have a Gastric Emptying Study.  Our records indicate you have not had this done.  Remember it is very important to follow your doctor's instructions.  Please have this done as soon as possible.  If you have questions regarding this appointment, please call our office and we can reschedule this for you.  It is important that patients and their doctor work together in the management and treatment of their health care.  If you have already had your test done, please disregard this letter.  Thank you,    Ave Filter  [facility] Ph: [phone]   Fax: [phone]   Appended Document: Radiology Test Reminder Pt rescheduled to 02/09/10@8 :00am  Appended Document: Radiology Test Reminder Pt aware

## 2010-05-16 NOTE — Letter (Signed)
Summary: Work Excuse  [facility]  [address]   [place], [place] [address]   Phone: [phone]  Fax: [phone]    Today's Date: December 22, 2009  Name of Patient: [name]  The above named patient had a medical visit today at: 9:30am  Please take this into consideration when reviewing the time away from work/school.    Special Instructions:  [  ] None  [  ] To be off the remainder of today, returning to the normal work / school schedule tomorrow.  [  ] To be off until the next scheduled appointment on ______________________.  [  ] Other ________________________________________________________________ ________________________________________________________________________   Sincerely yours,   Esperanza Sheets, PA-C

## 2010-05-16 NOTE — Progress Notes (Signed)
  Phone Note Call from Patient   Caller: Patient Summary of Call: patient states she is short of breath and lips are numb advised er Initial call taken by: Adella Hare LPN,  February 27, 2010 3:40 PM

## 2010-05-16 NOTE — Progress Notes (Signed)
  Phone Note Call from Patient   Caller: Patient Summary of Call: states loratidine and flonase not working, can you please put her on somthing else for her allergies Initial call taken by: Adella Hare LPN,  December 28, 2009 4:02 PM  Follow-up for Phone Call        Advise pt to discontinue Loratadine.  I have prescribed a new allergy pill.  Continue Flonase though. Follow-up by: Esperanza Sheets PA,  December 28, 2009 4:32 PM  Additional Follow-up for Phone Call Additional follow up Details #1::        Phone Call Completed Additional Follow-up by: Adella Hare LPN,  December 28, 2009 4:53 PM    New/Updated Medications: CETIRIZINE HCL 10 MG TABS (CETIRIZINE HCL) take 1 daily for allergies Prescriptions: CETIRIZINE HCL 10 MG TABS (CETIRIZINE HCL) take 1 daily for allergies  #30 x 2   Entered and Authorized by:   Esperanza Sheets PA   Signed by:   Esperanza Sheets PA on 12/28/2009   Method used:   Electronically to        [facility]  [address] (retail)       [address]       [place], [place]  [address]       Ph: [phone]       Fax: [phone]   RxID:   [id number]

## 2010-05-16 NOTE — Letter (Signed)
Summary: Letter  Letter   Imported By: Lind Guest 12/27/2009 08:51:40  _____________________________________________________________________  External Attachment:    Type:   Image     Comment:   External Document

## 2010-05-16 NOTE — Progress Notes (Signed)
Summary: speak with nurse  Phone Note Call from Patient   Summary of Call: pt wants to know if she can take extra iron pills [phone] Initial call taken by: Rudene Anda,  December 22, 2009 4:46 PM  Follow-up for Phone Call        she already takes ferrous sulfate 325 two times a day  Follow-up by: Everitt Amber LPN,  December 23, 2009 8:25 AM  Additional Follow-up for Phone Call Additional follow up Details #1::        She can take an extra one for 7 days. Then go back to two times a day. Additional Follow-up by: Esperanza Sheets PA,  December 23, 2009 8:30 AM    Additional Follow-up for Phone Call Additional follow up Details #2::    Patient aware Follow-up by: Everitt Amber LPN,  December 23, 2009 9:18 AM

## 2010-05-16 NOTE — Miscellaneous (Signed)
Summary: labs cbcd,bmp,11/16/2009  Clinical Lists Changes  Observations: Added new observation of CALCIUM: 9.4 mg/dL (04/18/7251 6:64) Added new observation of CREATININE: 0.78 mg/dL (40/34/7425 9:56) Added new observation of BUN: 8 mg/dL (38/75/6433 2:95) Added new observation of BG RANDOM: 147 mg/dL (18/84/1660 6:30) Added new observation of CO2 PLSM/SER: 26 meq/L (11/16/2009 9:23) Added new observation of CL SERUM: 98 meq/L (11/16/2009 9:23) Added new observation of K SERUM: 4.3 meq/L (11/16/2009 9:23) Added new observation of NA: 132 meq/L (11/16/2009 9:23) Added new observation of ABSOLUTE BAS: 0.0 K/uL (11/16/2009 9:23) Added new observation of BASOPHIL %: 0 % (11/16/2009 9:23) Added new observation of EOS ABSLT: 0.1 K/uL (11/16/2009 9:23) Added new observation of % EOS AUTO: 3 % (11/16/2009 9:23) Added new observation of ABSOLUTE MON: 0.3 K/uL (11/16/2009 9:23) Added new observation of MONOCYTE %: 6 % (11/16/2009 9:23) Added new observation of ABS LYMPHOCY: 2.3 K/uL (11/16/2009 9:23) Added new observation of LYMPHS %: 41 % (11/16/2009 9:23) Added new observation of PLATELETK/UL: 174 K/uL (11/16/2009 9:23) Added new observation of RDW: 15 % (11/16/2009 9:23) Added new observation of MCHC RBC: 32.7 g/dL (16/04/930 3:55) Added new observation of MCV: 89.7 fL (11/16/2009 9:23) Added new observation of HCT: 40.1 % (11/16/2009 9:23) Added new observation of HGB: 13.1 g/dL (73/22/0254 2:70) Added new observation of RBC M/UL: 4.47 M/uL (11/16/2009 9:23) Added new observation of WBC COUNT: 5.6 10*3/microliter (11/16/2009 9:23)

## 2010-05-16 NOTE — Progress Notes (Signed)
  Phone Note Call from Patient   Summary of Call: On Call Note:  11-21-09 5:19 pm Pt called requesting stronger pain med.  She was prescribed Toradol & it is not working.  When I advised pt that the office policy is not to prescription pain meds after hrs, pt stated she called the office at 4:30 today but hasnt had a return call yet.  Pt states she cannot take NSAIDs due to her stomach.  Advised pt she could use Tylenol.  May also try ice to the area of pain.  But her msg regarding pain meds from earlier this evening would need to be addressed during office hrs. Initial call taken by: Esperanza Sheets PA,  November 22, 2009 8:21 AM

## 2010-05-16 NOTE — Letter (Signed)
Summary: Work Excuse  [facility]  [address]   [place], [place] [address]   Phone: [phone]  Fax: [phone]    Today's Date: May 04, 2009  Name of Patient: [name]  The above named patient had a medical visit today at: 2:15pm  Please take this into consideration when reviewing the time away from work/school.    Special Instructions:  [* ] None  [  ] To be off the remainder of today, returning to the normal work / school schedule tomorrow.  [  ] To be off until the next scheduled appointment on ______________________.  [  ] Other ________________________________________________________________ ________________________________________________________________________   Sincerely yours,   Everitt Amber

## 2010-05-16 NOTE — Medication Information (Signed)
Summary: Tax adviser   Imported By: Lind Guest 11/23/2009 10:35:02  _____________________________________________________________________  External Attachment:    Type:   Image     Comment:   External Document

## 2010-05-16 NOTE — Letter (Signed)
Summary: Out of [facility]  [address]   [place], [place] [address]   Phone: [phone]  Fax: [phone]    June 10, 2009   Student:  HANNAHGRACE LALLI    To Whom It May Concern:   For Medical reasons, please excuse the above named student from school for the following dates:  Start:   06/06/09  End:    06/13/09 may return to school without restriction.  If you need additional information, please feel free to contact our office.   Sincerely,    Esperanza Sheets PA    ****This is a legal document and cannot be tampered with.  Schools are authorized to verify all information and to do so accordingly.

## 2010-05-16 NOTE — Progress Notes (Signed)
Summary: results  Phone Note Call from Patient   Summary of Call: wants results for her lab call her back at [phone] Initial call taken by: Lind Guest,  January 18, 2010 1:07 PM  Follow-up for Phone Call        called [facility] and they are trying to send it over emr Follow-up by: Everitt Amber LPN,  January 18, 2010 2:31 PM  Additional Follow-up for Phone Call Additional follow up Details #1::        called patient and notified her of lab results Additional Follow-up by: Everitt Amber LPN,  January 18, 2010 3:21 PM

## 2010-05-16 NOTE — Assessment & Plan Note (Signed)
Summary: ov   Vital Signs:  Patient profile:   45 year old female Menstrual status:  irregular Height:      64 inches Weight:      188.25 pounds BMI:     32.43 O2 Sat:      96 % Pulse rate:   96 / minute Pulse rhythm:   regular Resp:     16 per minute BP sitting:   130 / 82  Vitals Entered By: Everitt Amber (May 04, 2009 1:33 PM)  Nutrition Counseling: Patient's BMI is greater than 25 and therefore counseled on weight management options. CC: bleeding after she has sex and lots of pressure down there describes it as feeling like her uterus is falling out   Primary Care Provider:  Lodema Hong  CC:  bleeding after she has sex and lots of pressure down there describes it as feeling like her uterus is falling out.  History of Present Illness: 2 month h/o post coital bleeding. She denies vaginal d/c . She denies dyspareunia. She uses condoms all the time , states it is bright red. bG once daily random asround 149. Pelvic/suprapubic pressure x 2 days she states that sheis no longer drinking and is in school full time and enjoying it, she still smokes , though she has cut down and is trying to quit  Preventive Screening-Counseling & Management  Alcohol-Tobacco     Smoking Status: current     Smoking Cessation Counseling: yes     Packs/Day: 1.0  Allergies: 1)  ! Pcn  Review of Systems      See HPI General:  Denies chills and fever. Eyes:  Denies discharge and red eye. ENT:  Denies earache, hoarseness, nasal congestion, sinus pressure, and sore throat. CV:  Denies chest pain or discomfort and palpitations. Resp:  Denies cough and sputum productive. GI:  Denies abdominal pain, constipation, diarrhea, nausea, and vomiting. GU:  Complains of abnormal vaginal bleeding, dysuria, and urinary frequency; c/o post coityal bleeding, and pelvi pressure as though her bladder is falling out, symptoms started about 2 months ago. MS:  Denies joint pain and stiffness. Derm:  Denies itching,  lesion(s), and rash. Neuro:  Denies headaches, seizures, and sensation of room spinning. 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 Endo:  Denies cold intolerance, excessive hunger, excessive thirst, excessive urination, heat intolerance, polyuria, and weight change; reports improved blood sugars. Heme:  Denies abnormal bruising and bleeding. Allergy:  Complains of seasonal allergies.  Physical Exam  General:  Well-developed,well-nourished,in no acute distress; alert,appropriate and cooperative throughout examination HEENT: No facial asymmetry,  EOMI,no sinus tenderness, TM's Clear, oropharynx  pink and moist.   Chest: decreased air entry, scattered crackles and wheezes.  CVS: S1, S2, No murmurs, No S3.   Abd: Soft, Nontender.  MS: Adequate ROM spine, hips, shoulders and knees.  Ext: No edema.   CNS: CN 2-12 intact, power tone and sensation normal throughout.   Skin: Intact, no visible lesions or rashes.  Psych: Good eye contact, normal affect.  Memory intact, not anxious or depressed appearing.    Impression & Recommendations:  Problem # 1:  NONSPECIFIC ABN FINDING RAD & OTH EXAM GU ORGAN (ICD-793.5) Assessment Comment Only  Orders: Radiology Referral (Radiology), pt had a previously abn pelvic US wich needs f/u, she now  reports post coital bleeding  Problem # 2:  DIABETES MELLITUS, TYPE II (ICD-250.00) Assessment: Unchanged  Her updated medication list for this problem includes:    Metformin Hcl 1000 Mg Tabs (Metformin hcl) .Marland Kitchen... Take 1 tablet by mouth two times a day  Orders: Glucose, (CBG) [phone])  Labs Reviewed: Creat: 0.80 (02/23/2009)    Reviewed HgBA1c results: 7.1 (03/08/2009)  6.6 (12/13/2008)  Problem # 3:   NICOTINE ADDICTION (ICD-305.1) Assessment: Unchanged  Encouraged smoking cessation and discussed different methods for smoking cessation.   Problem # 4:  ALCOHOL ABUSE (ICD-305.00) Assessment: Improved  Problem # 5:  ACUTE CYSTITIS (ICD-595.0) Assessment: Comment Only  Her updated medication list for this problem includes:    Septra Ds 800-160 Mg Tabs (Sulfamethoxazole-trimethoprim) .Marland Kitchen... Take 1 tablet by mouth two times a day  Orders: UA Dipstick W/ Micro (manual) (60454) T-Culture, Urine ([id number])  Encouraged to push clear liquids, get enough rest, and take acetaminophen as needed. To be seen in 10 days if no improvement, sooner if worse.  Complete Medication List: 1)  Depakote Er 250 Mg Tb24 (Divalproex sodium) .... Take 4  at bedtime 2)  Atenolol 25 Mg Tabs (Atenolol) .... Take 1 tablet by mouth once a day 3)  Ferrous Sulfate 325 (65 Fe) Mg Tabs (Ferrous sulfate) .... Take 1 tablet by mouth two times a day 4)  Geodon 40 Mg Caps (Ziprasidone hcl) .... Take 1 tab by mouth at bedtime 5)  Geodon 80 Mg Caps (Ziprasidone hcl) .... Take two tablets at bedtime 6)  Lexapro 10 Mg Tabs (Escitalopram oxalate) .... Take two tablets in am 7)  Hydrochlorothiazide 25 Mg Tabs (Hydrochlorothiazide) .... One tab by mouth once daily 8)  Loratadine 10 Mg Tabs (Loratadine) .... One tab by mouth once daily 9)  Metformin Hcl 1000 Mg Tabs (Metformin hcl) .... Take 1 tablet by mouth two times a day 10)  Lovastatin 40 Mg Tabs (Lovastatin) .... One tab by mouth qhs 11)  Nitrostat 0.4 Mg Subl (Nitroglycerin) .... Sublingualy every 5 min as needed, max 3 tabs in 5 mn. if no improvement call 911 12)  Singulair 10 Mg Tabs (Montelukast sodium) .... Take 1 tablet by mouth once a day 13)  Klor-con M20 20 Meq Cr-tabs (Potassium chloride crys cr) .... Take 2 tablest by mouth two times a day 14)  Campral 333 Mg Tbec (Acamprosate calcium) .... Take2 three times a day 15)  Omeprazole 20 Mg Tbec (Omeprazole) ....  Two times a day 16)  Trazodone Hcl 50 Mg Tabs (Trazodone hcl) .... One half to one tab by mouth qhs 17)  Gabapentin 100 Mg Caps (Gabapentin) .... Take 1 capsule by mouth at bedtime 18)  Septra Ds 800-160 Mg Tabs (Sulfamethoxazole-trimethoprim) .... Take 1 tablet by mouth two times a day 19)  Proventil Hfa 108 (90 Base) Mcg/act Aers (Albuterol sulfate) .... 2 puffs every 6-8 hrs  Patient Instructions: 1)  f/u end Feb 2)  you need a pelvic US asap, i will also send a note to Dr. Emelda Fear about you. 3)  your urine will be tested. 4)  Tobacco is very bad for your health and your loved ones! You Should stop smoking!. 5)  Stop Smoking Tips: Choose a Quit date. Cut down before the Quit date. decide what you will do as a substitute when you feel the urge to smoke(gum,toothpick,exercise). 6)  congrats on alcohol cessation  Laboratory Results   Urine Tests    Routine Urinalysis   Color: lt. yellow Appearance: Clear  Glucose: negative   (Normal Range: Negative) Bilirubin: negative   (Normal Range: Negative) Ketone: negative   (Normal Range: Negative) Spec. Gravity: 1.025   (Normal Range: 1.003-1.035) Blood: trace-intact   (Normal Range: Negative) pH: 6.5   (Normal Range: 5.0-8.0) Protein: negative   (Normal Range: Negative) Urobilinogen: 0.2   (Normal Range: 0-1) Nitrite: negative   (Normal Range: Negative) Leukocyte Esterace: negative   (Normal Range: Negative)     Blood Tests   Date/Time Received: May 04, 2009  Date/Time Reported: May 04, 2009   Glucose (random): 248 mg/dL   (Normal Range: 16-109)

## 2010-05-16 NOTE — Miscellaneous (Signed)
Summary: LABS CBCD,BMP,LIPID,LIVER,09/28/2009  Clinical Lists Changes  Observations: Added new observation of ABSOLUTE BAS: 00 K/uL (09/28/2009 17:14) Added new observation of BASOPHIL %: 0 % (09/28/2009 17:14) Added new observation of EOS ABSLT: 0.3 K/uL (09/28/2009 17:14) Added new observation of % EOS AUTO: 4 % (09/28/2009 17:14) Added new observation of ABSOLUTE MON: 0.3 K/uL (09/28/2009 17:14) Added new observation of MONOCYTE %: 4 % (09/28/2009 17:14) Added new observation of ABS LYMPHOCY: 2.7 K/uL (09/28/2009 17:14) Added new observation of LYMPHS %: 36 % (09/28/2009 17:14) Added new observation of RDW: 15.1 % (09/28/2009 17:14) Added new observation of RBC M/UL: 4.54 M/uL (09/28/2009 17:14) Added new observation of CALCIUM: 9.7 mg/dL (04/54/0981 19:14) Added new observation of ALBUMIN: 4.1 g/dL (78/29/5621 30:86) Added new observation of PROTEIN, TOT: 6.8 g/dL (57/84/6962 95:28) Added new observation of SGPT (ALT): 8 units/L (09/28/2009 17:14) Added new observation of SGOT (AST): 8 units/L (09/28/2009 17:14) Added new observation of ALK PHOS: 72 units/L (09/28/2009 17:14) Added new observation of BILI DIRECT: 0.3 mg/dL (41/32/4401 02:72) Added new observation of CREATININE: 0.90 mg/dL (53/66/4403 47:42) Added new observation of BUN: 12 mg/dL (59/56/3875 64:33) Added new observation of BG RANDOM: 156 mg/dL (29/51/8841 66:06) Added new observation of CO2 PLSM/SER: 28 meq/L (09/28/2009 17:14) Added new observation of CL SERUM: 99 meq/L (09/28/2009 17:14) Added new observation of K SERUM: 4.9 meq/L (09/28/2009 17:14) Added new observation of NA: 138 meq/L (09/28/2009 17:14) Added new observation of LDL: 61 mg/dL (30/16/0109 32:35) Added new observation of HDL: 28 mg/dL (57/32/2025 42:70) Added new observation of TRIGLYC TOT: 335 mg/dL (62/37/6283 15:17) Added new observation of CHOLESTEROL: 156 mg/dL (61/60/7371 06:26) Added new observation of PLATELETK/UL: 249 K/uL (09/28/2009  17:14) Added new observation of MCV: 91.0 fL (09/28/2009 17:14) Added new observation of HCT: 41.3 % (09/28/2009 17:14) Added new observation of HGB: 13.4 g/dL (94/85/4627 03:50) Added new observation of WBC COUNT: 7.5 10*3/microliter (09/28/2009 17:14) Added new observation of HGBA1C: 7.2 % (09/28/2009 17:14)

## 2010-05-16 NOTE — Progress Notes (Signed)
Summary: MEDICINE  Phone Note Call from Patient   Summary of Call: TRAMDOL FOR PAIN IS NOT WORKING COULD YOU SEND IN SOMETHING STRONGER SENT TO [facility] Initial call taken by: Lind Guest,  November 21, 2009 4:27 PM  Follow-up for Phone Call        Advise pt to discontinue Tramadol. She has an appt with Dr Lodema Hong today & can discuss her pain then. Follow-up by: Esperanza Sheets PA,  November 22, 2009 8:28 AM  Additional Follow-up for Phone Call Additional follow up Details #1::        Patient here for OV Additional Follow-up by: Everitt Amber LPN,  November 22, 2009 1:35 PM

## 2010-05-16 NOTE — Assessment & Plan Note (Signed)
Summary: Pulmonary/ ext post hosp f/u ov    Copy to:  [facility]  Primary Provider/Referring Provider:  Lodema Hong  CC:  Post hospital followup.  Pt c/o weakness.  She states that her breathing has overall been doing well.  Marland Kitchen  History of Present Illness: 26 yobf quit smoking 12/2009 when admit to [facility] then transferred to   [facility] 7 days resp distress with bilateral effusions c/w CAP ? viral   January 16, 2010  1st pulmonary office eval Post hospital followup. cc  weakness.  She states that her breathing has overall been doing well.   still having some orthopnea using 2-3 pillows no cough. Pt denies any significant sore throat, dysphagia, itching, sneezing,  nasal congestion or excess secretions,  fever, chills, sweats, unintended wt loss, pleuritic or exertional cp, hempoptysis, change in activity tolerance  orthopnea pnd or leg swelling Pt also denies any obvious fluctuation in symptoms with weather or environmental change or other alleviating or aggravating factors.       Current Medications (verified): 1)  Metformin Hcl 1000 Mg Tabs (Metformin Hcl) .... Take 1 Tablet By Mouth Two Times A Day 2)  Lovastatin 20 Mg Tabs (Lovastatin) .... 2 Tablets By Mouth At Bedtime 3)  Nitrostat 0.4 Mg Subl (Nitroglycerin) .... Sublingualy Every 5 Min As Needed, Max 3 Tabs in 5 Mn. If No Improvement Call 911 4)  Omeprazole 20 Mg Tbec (Omeprazole) .Marland Kitchen.. 1 Two Times A Day 5)  Accu Chek Aviva Strips and Lancets .... For Once Daily Testing 6)  Fish Oil 300 Mg Caps (Omega-3 Fatty Acids) .... Take 1 Tablet By Mouth Once A Day Not Taking 7)  Amlodipine Besylate 2.5 Mg Tabs (Amlodipine Besylate) .... Take 1 Tablet By Mouth Once Daily 8)  Ferrous Sulfate 325 (65 Fe) Mg Tabs (Ferrous Sulfate) .Marland Kitchen.. 1 Two Times A Day 9)  Megestrol Acetate 40 Mg Tabs (Megestrol Acetate) .... Take 1 Tablet By Mouth Once A Day 10)  Klor-Con M20 20 Meq Cr-Tabs (Potassium Chloride Crys Cr) .Marland Kitchen.. 1 Once Daily 11)  Metoprolol Tartrate 50 Mg  Tabs (Metoprolol Tartrate) .Marland Kitchen.. 1 Two Times A Day 12)  Divalproex Sodium 500 Mg Tbec (Divalproex Sodium) .... 2 At Bedtime 13)  Lexapro 20 Mg Tabs (Escitalopram Oxalate) .Marland Kitchen.. 1 Every Am 14)  Hydrochlorothiazide 25 Mg Tabs (Hydrochlorothiazide) .Marland Kitchen.. 1 Every Am  Allergies (verified): 1)  ! Pcn 2)  ! Flagyl  Past History:  Past Medical History: ASCVD: Minimal at cath in 09/2005; negative pharm. stress nuclear study in 8/08 with nl EF; neg. stress echo'-10 ECHO   06/05/09 Mod diastolic dysfunction, no chagne 01/03/10 AODM-no insulin; onset 2000 HYPERLIPIDEMIA HYPERTENSION-during treatment treatment with Geodon Tobacco abuse: 30 pack years; continuing at one pack per day Gastroesophageal reflux disease; Schatzki's ring Iron deficiency anemia  DEPRESSION (ICD-311) ALCOHOL ABUSE (ICD-305.00) OBESITY (ICD-278.00)  Vital Signs:  Patient profile:   45 year old female Menstrual status:  irregular Weight:      180 pounds O2 Sat:      100 % on Room air Temp:     98.9 degrees F oral Pulse rate:   89 / minute BP sitting:   106 / 64  (left arm)  Vitals Entered By: Vernie Murders (January 16, 2010 4:09 PM)  O2 Flow:  Room air  Physical Exam  Additional Exam:  wt 190 > 180 January 16, 2010 amb somber bf nad HEENT: nl dentition, turbinates, and orophanx. Nl external ear canals without cough reflex NECK :  without JVD/Nodes/TM/ nl carotid upstrokes bilaterally LUNGS: no acc muscle use, clear to A and P bilaterally without cough on insp or exp maneuvers CV:  RRR  no s3 or murmur or increase in P2, no edema  ABD:  soft and nontender with nl excursion in the supine position. No bruits or organomegaly, bowel sounds nl MS:  warm without deformities, calf tenderness, cyanosis or clubbing SKIN: warm and dry without lesions   NEURO:  alert, approp, no  deficits     CXR  Procedure date:  01/07/2010  Findings:      Complete resolution of effusions  Impression & Recommendations:  Problem # 1:  RESPIRATORY FAILURE, ACUTE (ICD-518.81)  Probably combination of acute bronchitis related to smoking and chf from diastolic heart failure.   key is to maintain off cigs and keep bp down though this may be the reason she feels so fatigued  Problem # 2:  HYPERTENSION (ICD-401.9)  Her updated medication list for this problem includes:    Amlodipine Besylate 2.5 Mg Tabs (Amlodipine besylate) .Marland Kitchen... Take 1 tablet by mouth once daily    Metoprolol Tartrate 50 Mg Tabs (Metoprolol tartrate) .Marland Kitchen... 1 two times a day    Hydrochlorothiazide 25 Mg Tabs (Hydrochlorothiazide) .Marland Kitchen... 1 every am  Adjust rx per Dr Lodema Hong  Problem # 3:  TOBACCO ABUSE (ICD-305.1)  I took this opportunity to educate the patient regarding the consequences of smoking in airway disorders based on all the data we have from the multiple national lung health studies indicating that smoking cessation, not choice of inhalers or physicians, is the most important aspect of care.    no need for pft's if no residual symptoms  Problem # 4:  CHRONIC RHINITIS (ICD-472.0) ok to resume zyrtec without the decongestants  Medications Added to Medication List This Visit: 1)  Omeprazole 20 Mg Tbec (Omeprazole) .... Take one 30-60 min before first and last meals of the day 2)  Omeprazole 20 Mg Tbec (Omeprazole) .Marland Kitchen.. 1 two times a day 3)  Fish Oil 300 Mg Caps (Omega-3 fatty acids) .... Take 1 tablet by mouth once a day not taking 4)  Ferrous Sulfate 325 (65 Fe) Mg Tabs (Ferrous sulfate) .Marland Kitchen.. 1 two times a day 5)  Klor-con M20 20 Meq Cr-tabs (Potassium chloride crys cr) .Marland Kitchen.. 1 once daily 6)  Metoprolol Tartrate 50 Mg Tabs (Metoprolol tartrate) .Marland Kitchen.. 1 two times a day 7)  Divalproex Sodium 500 Mg Tbec (Divalproex sodium) .... 2 at bedtime 8)  Lexapro 20 Mg Tabs (Escitalopram oxalate) .Marland Kitchen.. 1 every  am 9)  Hydrochlorothiazide 25 Mg Tabs (Hydrochlorothiazide) .Marland Kitchen.. 1 every am 10)  Zyrtec Allergy 10 Mg Tabs (Cetirizine hcl) .... By mouth daily as needed itchy sneezy runny nose 11)  Diflucan 100 Mg Tabs (Fluconazole) .... One  today  Other Orders: Est. Patient Level IV (57846)  Patient Instructions: 1)  Ok to use zyrtec 10 mg one daily for nasal congestion 2)  No smoking 3)  As long as breathing is normal there is no need to return to pulmonary clinic, the most important aspect of your care is to maintain off cigarettes 4)  Diflucan 100 mg take this evening  x one dose only 5)  GERD (REFLUX)  is a common cause of respiratory symptoms. It commonly presents without heartburn and can be treated with medication, but also with lifestyle changes including avoidance of late meals, excessive alcohol, smoking cessation, and avoid fatty foods, chocolate, peppermint, colas, red wine, and acidic juices such as orange juice. NO MINT OR MENTHOL PRODUCTS SO NO COUGH DROPS  6)  USE SUGARLESS CANDY INSTEAD (jolley ranchers)  7)  NO OIL BASED VITAMINS (no fish oil)  Prescriptions: DIFLUCAN 100 MG TABS (FLUCONAZOLE) one today  #1 x 0   Entered and Authorized by:   Nyoka Cowden MD   Signed by:   Nyoka Cowden MD on 01/16/2010   Method used:   Electronically to        [facility]  [address] [facility] (retail)       [address]       [place], [place]  [address]       Ph: [phone]       Fax: [phone]   RxID:   [id number]

## 2010-05-16 NOTE — Progress Notes (Signed)
Summary: note  Phone Note Call from Patient   Summary of Call: needs note to be extended until this monday. [phone] Initial call taken by: Rudene Anda,  June 15, 2009 8:42 AM  Follow-up for Phone Call        [facility] to extend note thru 06-13-09 with return on 06-14-09 Follow-up by: Esperanza Sheets PA,  June 15, 2009 10:59 AM  Additional Follow-up for Phone Call Additional follow up Details #1::        printed and ready for signature Additional Follow-up by: Everitt Amber LPN,  June 15, 2009 1:54 PM

## 2010-05-16 NOTE — Progress Notes (Signed)
Summary: TET SHOT UPDATED  Phone Note Call from Patient   Summary of Call: WANTS TO KNOW IS UPDATED ON HER TED SHOT BY  SCRATCHED A SCREW  LAST NIGHT IN HER SLEEP CALL BACK AT [phone] Initial call taken by: Lind Guest,  March 02, 2010 1:08 PM  Follow-up for Phone Call        called patient, no answer Follow-up by: Adella Hare LPN,  March 02, 2010 3:02 PM  Additional Follow-up for Phone Call Additional follow up Details #1::        tdap up to date patient aware Additional Follow-up by: Adella Hare LPN,  March 02, 2010 3:04 PM

## 2010-05-16 NOTE — Progress Notes (Signed)
Summary: results  Phone Note Call from Patient   Summary of Call: results of her mammogram call back at [phone] Initial call taken by: Lind Guest,  February 27, 2010 4:44 PM  Follow-up for Phone Call        called patient, no answer, mailed letter Follow-up by: Adella Hare LPN,  March 01, 2010 11:52 AM

## 2010-05-18 NOTE — Letter (Signed)
Summary: [facility] ORDER  [facility] ORDER   Imported By: Ave Filter 04/12/2010 16:29:22  _____________________________________________________________________  External Attachment:    Type:   Image     Comment:   External Document

## 2010-05-18 NOTE — Letter (Signed)
Summary: dermatology  dermatology   Imported By: Lind Guest 04/06/2010 08:31:48  _____________________________________________________________________  External Attachment:    Type:   Image     Comment:   External Document

## 2010-05-18 NOTE — Assessment & Plan Note (Signed)
Summary: ROV POSSIBLE FLUID  Medications Added KLOR-CON M20 20 MEQ CR-TABS (POTASSIUM CHLORIDE CRYS CR) Take 1 tablet by mouth two times a day ZYRTEC ALLERGY 10 MG  TABS (CETIRIZINE HCL) 1 tab daily TRAZODONE HCL 100 MG TABS (TRAZODONE HCL) take 2 at bed time OMEPRAZOLE 20 MG CPDR (OMEPRAZOLE) take 1 tab two times a day      Allergies Added:   Visit Type:  Follow-up Referring Provider:  [facility]  Primary Provider:  Dr. Syliva Overman   History of Present Illness: [name] is a 45 y/o AAF with history of atypical chest pain, hyperlipidemia, hypertension, diabetes, and ongoing tobacco abuse.  She presents today for f/u with complaints of fluid retention and ongoing dizziness. She was last seen by Dr. Dietrich Pates in June of 2011 for palpatations.  She was tested with a holter monitor which was found to be normal.  She has had mulitple visits to ER for somatic complaints.  Recently has been diagnosed with fibromyalgia.  She complains of weight gain.  She and her primary care physician have parted ways and she is not followed by anyone at this time.  She said that they had a disagreement.   Current Medications (verified): 1)  Metformin Hcl 1000 Mg Tabs (Metformin Hcl) .... Take 1 Tablet By Mouth Two Times A Day 2)  Lovastatin 20 Mg Tabs (Lovastatin) .... 2 Tablets By Mouth At Bedtime 3)  Nitrostat 0.4 Mg Subl (Nitroglycerin) .... Sublingualy Every 5 Min As Needed, Max 3 Tabs in 5 Mn. If No Improvement Call 911 4)  Ferrous Sulfate 325 (65 Fe) Mg Tabs (Ferrous Sulfate) .... Take 1 Tablet By Mouth Two Times A Day 5)  Klor-Con M20 20 Meq Cr-Tabs (Potassium Chloride Crys Cr) .... Take 1 Tablet By Mouth Two Times A Day 6)  Metoprolol Tartrate 50 Mg Tabs (Metoprolol Tartrate) .... Take 1 Tablet By Mouth Two Times A Day 7)  Divalproex Sodium 500 Mg Tbec (Divalproex Sodium) .... Take 2 Tablets By Mouth At Bedtime 8)  Lexapro 20 Mg Tabs (Escitalopram Oxalate) .... Take 1 Tablet By Mouth Once A  Day 9)  Hydrochlorothiazide 25 Mg Tabs (Hydrochlorothiazide) .... Take 1 Tablet By Mouth Once A Day 10)  Zyrtec Allergy 10 Mg  Tabs (Cetirizine Hcl) .Marland Kitchen.. 1 Tab Daily 11)  Strips and Lancets For True Result Meter .... Three Times A Day Testing 12)  Glimepiride 2 Mg Tabs (Glimepiride) .... Take 1 Tablet By Mouth Two Times A Day 13)  Clotrimazole 1 % Crea (Clotrimazole) .... Aqpply Twice Daily To Feet For Rash and Cracking 14)  Tylenol Extra Strength 500 Mg Tabs (Acetaminophen) .... Take 2  Capsules Eveyr 8 Hours As Needed 15)  Trazodone Hcl 100 Mg Tabs (Trazodone Hcl) .... Take 2 At Bed Time 16)  Fluticasone Propionate 50 Mcg/act Susp (Fluticasone Propionate) .Marland Kitchen.. 1-2 Sprays in Each Nostril Once Daily As Needed 17)  Norco 5-325 Mg Tabs (Hydrocodone-Acetaminophen) .... Take 1-2 By Mouth Every 4-6 Hours As Needed Pain 18)  Advil 200 Mg Tabs (Ibuprofen) .... Take 1-2 By Mouth Once Daily As Needed 19)  Clotrimazole-Betamethasone 1-0.05 % Crea (Clotrimazole-Betamethasone) .... Apply Twice Daily To Soles of Feet X 3 Weeks , Then As Needed 20)  Omeprazole 20 Mg Cpdr (Omeprazole) .... Take 1 Tab Two Times A Day  Allergies (verified): 1)  ! Pcn 2)  ! Flagyl 3)  ! Sulfa 4)  ! Bactrim  Comments:  Nurse/Medical Assistant: patient didn't bring meds or list reviewed med with patient her  pharmacy is [facility] in [place]   Past History:  Past medical, surgical, family and social histories (including risk factors) reviewed, and no changes noted (except as noted below).  Past Medical History: Reviewed history from 02/15/2010 and no changes required. ASCVD: Minimal at cath in 09/2005; negative pharm. stress nuclear study in 8/08 with nl EF; neg. stress echo'-10 AODM-no insulin; onset 2000 HYPERLIPIDEMIA HYPERTENSION-during treatment treatment with Geodon Tobacco abuse: 30 pack years; continuing at one pack per day Community pneumonia w/ pleural effusions- hosp [facility] acute resp failure  01/03/10 Gastroesophageal reflux disease; Schatzki's ring Iron deficiency anemia                                                                                                                                                DEPRESSION (ICD-311) ALCOHOL ABUSE (ICD-305.00) OBESITY (ICD-278.00) Diastolic dysfunction 12/2009 Pruritic Rash 111/2/11  Past Surgical History: Reviewed history from 03/29/2007 and no changes required. D/C-1992  Family History: Reviewed history from 12/16/2009 and no changes required. Mother is alive at age 90 with hypertension Dad deceased age 40  CVA Sister 4 healthy; one with heart disease Brother 3 healthy  Social History: Reviewed history from 02/15/2010 and no changes required. 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   Vital Signs:  Patient profile:   45 year old female Menstrual status:  irregular Weight:      195 pounds BMI:     33.59 O2 Sat:      99 % on Room air Pulse rate:   89 / minute BP sitting:   125 / 81  (left arm)  Vitals Entered By: Dreama Saa, CNA (April 12, 2010 11:06 AM)  O2 Flow:  Room air  Physical Exam  General:  Well developed, well nourished, in no acute distress. Lungs:  Clear bilaterally to auscultation and percussion. Heart:  Non-displaced PMI, chest non-tender; regular rate and rhythm, S1, S2 without murmurs, rubs or gallops. Carotid upstroke normal, no bruit. Normal abdominal aortic size, no bruits. Femorals normal pulses, no bruits. Pedals normal pulses. No edema, no varicosities. Abdomen:  Bowel sounds positive; abdomen soft and non-tender without masses, organomegaly, or hernias noted. No hepatosplenomegaly. Obese Msk:  Back normal, normal gait. Muscle strength and tone  normal. Pulses:  pulses normal in all 4 extremities Extremities:  No clubbing or cyanosis. Neurologic:  Alert and oriented x 3. Psych:  depressed affect.     Impression & Recommendations:  Problem # 1:  PALPITATIONS (ICD-785.1) Assessment Improved She denies this presently.  Her updated medication list for this problem includes:    Nitrostat 0.4 Mg Subl (Nitroglycerin) ..... Sublingualy every  5 min as needed, max 3 tabs in 5 mn. if no improvement call 911    Metoprolol Tartrate 50 Mg Tabs (Metoprolol tartrate) .Marland Kitchen... Take 1 tablet by mouth two times a day  Problem # 2:  HYPERTENSION (ICD-401.9) We have completed orthstatics during this visit and she is normal without evidence of orthostasis.    Review of echo on 01/03/2010 demonstrated normal LV fx with Grade II diastolic dysfunction.    Blood pressure is controlled currently. Will not make any changes. Her updated medication list for this problem includes:    Metoprolol Tartrate 50 Mg Tabs (Metoprolol tartrate) .Marland Kitchen... Take 1 tablet by mouth two times a day    Hydrochlorothiazide 25 Mg Tabs (Hydrochlorothiazide) .Marland Kitchen... Take 1 tablet by mouth once a day  Problem # 3:  OBESITY (ICD-278.00) I have advised her to walk or exercise daily for 30 minutes to assist in weight loss, stay compliant with Diabetic diet.  Patient Instructions: 1)  Your physician recommends that you schedule a follow-up appointment in: 12 months 2)  Your physician recommends that you continue on your current medications as directed. Please refer to the Current Medication list given to you today.

## 2010-05-18 NOTE — Assessment & Plan Note (Signed)
Summary: reflux problems/cm   Visit Type:  Follow-up Visit Primary Care Provider:  Dr. Syliva Overman  CC:  blood in stool.  History of Present Illness: Pt presents today secondary to rectal bleeding. has hx of intermittent N/V, reflux. GES normal. On Prilosec twice daily which controls reflux. No exacerbations unless she overeats. States has noted bleeding for several months, brbpr, 1-2X/month. Denies abdominal pain, denies constipation. intermittent nausea, at baseline. long hx of nausea. Denies loss of appetite. Here to schedule colonoscopy.   Last TCS: October 2007: IMPRESSION:  Internal hemorrhoids, otherwise normal rectum and colon.    Current Medications (verified): 1)  Metformin Hcl 1000 Mg Tabs (Metformin Hcl) .... Take 1 Tablet By Mouth Two Times A Day 2)  Lovastatin 20 Mg Tabs (Lovastatin) .... 2 Tablets By Mouth At Bedtime 3)  Nitrostat 0.4 Mg Subl (Nitroglycerin) .... Sublingualy Every 5 Min As Needed, Max 3 Tabs in 5 Mn. If No Improvement Call 911 4)  Ferrous Sulfate 325 (65 Fe) Mg Tabs (Ferrous Sulfate) .... Take 1 Tablet By Mouth Two Times A Day 5)  Klor-Con M20 20 Meq Cr-Tabs (Potassium Chloride Crys Cr) .... Take 1 Tablet By Mouth Two Times A Day 6)  Metoprolol Tartrate 50 Mg Tabs (Metoprolol Tartrate) .... Take 1 Tablet By Mouth Two Times A Day 7)  Divalproex Sodium 500 Mg Tbec (Divalproex Sodium) .... Take 2 Tablets By Mouth At Bedtime 8)  Lexapro 20 Mg Tabs (Escitalopram Oxalate) .... Take 1 Tablet By Mouth Once A Day 9)  Hydrochlorothiazide 25 Mg Tabs (Hydrochlorothiazide) .... Take 1 Tablet By Mouth Once A Day 10)  Zyrtec Allergy 10 Mg  Tabs (Cetirizine Hcl) .Marland Kitchen.. 1 Tab Daily 11)  Strips and Lancets For True Result Meter .... Three Times A Day Testing 12)  Glimepiride 2 Mg Tabs (Glimepiride) .... Take 1 Tablet By Mouth Two Times A Day 13)  Clotrimazole 1 % Crea (Clotrimazole) .... Aqpply Twice Daily To Feet For Rash and Cracking 14)  Tylenol Extra Strength 500 Mg  Tabs (Acetaminophen) .... Take 2  Capsules Eveyr 8 Hours As Needed 15)  Trazodone Hcl 100 Mg Tabs (Trazodone Hcl) .... Take 2 At Bed Time 16)  Fluticasone Propionate 50 Mcg/act Susp (Fluticasone Propionate) .Marland Kitchen.. 1-2 Sprays in Each Nostril Once Daily As Needed 17)  Norco 5-325 Mg Tabs (Hydrocodone-Acetaminophen) .... Take 1-2 By Mouth Every 4-6 Hours As Needed Pain 18)  Advil 200 Mg Tabs (Ibuprofen) .... Take 1-2 By Mouth Once Daily As Needed 19)  Clotrimazole-Betamethasone 1-0.05 % Crea (Clotrimazole-Betamethasone) .... Apply Twice Daily To Soles of Feet X 3 Weeks , Then As Needed 20)  Omeprazole 20 Mg Cpdr (Omeprazole) .... Take 1 Tab Two Times A Day  Allergies (verified): 1)  ! Pcn 2)  ! Flagyl 3)  ! Sulfa 4)  ! Bactrim  Past History:  Past Medical History: ASCVD: Minimal at cath in 09/2005; negative pharm. stress nuclear study in 8/08 with nl EF; neg. stress echo'-10 AODM-no insulin; onset 2000 HYPERLIPIDEMIA HYPERTENSION-during treatment treatment with Geodon Community pneumonia w/ pleural effusions- hosp [place] acute resp failure 01/03/10 Gastroesophageal reflux disease; Schatzki's ring Iron deficiency anemia  DEPRESSION (ICD-311) ALCOHOL ABUSE (ICD-305.00) OBESITY (ICD-278.00) Diastolic dysfunction 12/2009 Pruritic Rash 111/2/11  Past Surgical History: Reviewed history from 03/29/2007 and no changes required. D/C-1992  Family History: Reviewed history from 12/16/2009 and no changes required. Mother is alive at age 27 with hypertension Dad deceased age 84  CVA Sister 4 healthy; one with heart disease Brother 3 healthy No FH of Colon Cancer:  Social History: Reviewed history from 02/15/2010 and no changes required. 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 hasn't drunk since  leaving hospital 9/25.  Drug use-no Tobacco: 30 pack years; one pack per day  Review of Systems General:  Denies fever, chills, and anorexia. Eyes:  Denies blurring, irritation, and discharge. ENT:  Denies sore throat, hoarseness, and difficulty swallowing. CV:  Denies chest pains and syncope. Resp:  Denies dyspnea at rest and wheezing. GI:  Complains of nausea and bloody BM's; denies difficulty swallowing, pain on swallowing, indigestion/heartburn, abdominal pain, and fecal incontinence. GU:  Denies urinary burning, blood in urine, and urinary frequency. MS:  Denies joint pain / LOM, joint swelling, and joint stiffness. Derm:  Denies rash, itching, and dry skin. Neuro:  Denies weakness and syncope. Psych:  Denies depression and anxiety. Endo:  Denies cold intolerance and heat intolerance. Heme:  Denies bruising and bleeding.  Vital Signs:  Patient profile:   45 year old female Menstrual status:  irregular Height:      64 inches Weight:      197 pounds BMI:     33.94 Temp:     98.7 degrees F oral Pulse rate:   88 / minute BP sitting:   128 / 90  (left arm) Cuff size:   regular  Vitals Entered By: Hendricks Limes LPN (April 12, 2010 3:18 PM)  Physical Exam  General:  Well developed, well nourished, no acute distress. Head:  Normocephalic and atraumatic. Eyes:  sclera without icterus, conjuctiva clear. Lungs:  Clear throughout to auscultation. Heart:  Regular rate and rhythm; no murmurs, rubs,  or bruits. Abdomen:  normal bowel sounds, obese, without guarding, without rebound, no distesion, no masses, and no hepatomegally or splenomegaly.   Msk:  Symmetrical with no gross deformities. Normal posture. Pulses:  Normal pulses noted. Extremities:  No clubbing, cyanosis, edema or deformities noted. Neurologic:  Alert and  oriented x4;  grossly normal neurologically. Skin:  Intact without significant lesions or rashes. Psych:  Alert and cooperative. Normal mood and  affect.  Impression & Recommendations:  Problem # 1:  RECTAL BLEEDING (ICD-28.16)  45 year old female with several month hx of brbpr 1-2X per month. Denies abdominal pain, constipation, loss of appetite, wt loss. Last colonoscopy Oct 2007, noting internal hemorrhoids. hematochezia could be secondary to internal hemorrhoids, yet malignancy, polyps can't be excluded.   Schedule TCS in OR with Dr. Jena Gauss (secondary to polypharmacy): the risks, benefits, and alternatives have been discussed in detail with the pt; verbal consent obtained.   Orders: Est. Patient Level II (04540)  Problem # 2:  GASTROESOPHAGEAL REFLUX DISEASE (ICD-530.81)  Well-controlled on twice daily prilosec. continue current plan.   Orders: Est. Patient Level II (98119)

## 2010-05-18 NOTE — Letter (Signed)
Summary: rheumatology  rheumatology   Imported By: Lind Guest 03/31/2010 08:18:18  _____________________________________________________________________  External Attachment:    Type:   Image     Comment:   External Document

## 2010-05-18 NOTE — Letter (Signed)
Summary: medical release  medical release   Imported By: Lind Guest 04/13/2010 11:31:39  _____________________________________________________________________  External Attachment:    Type:   Image     Comment:   External Document

## 2010-05-22 NOTE — Discharge Summary (Signed)
[name], [name]             ACCOUNT NO.:  [id number]  MEDICAL RECORD NO.:  [id number]          PATIENT TYPE:  IPS  LOCATION:  0404                          FACILITY:  [facility]  PHYSICIAN:  Anselm Jungling, MD  DATE OF BIRTH:  01-Sep-1965  DATE OF ADMISSION:  05/04/2010 DATE OF DISCHARGE:  05/15/2010                              DISCHARGE SUMMARY   IDENTIFYING DATA AND REASON FOR ADMISSION:  This was one of several [facility] admissions for [name], a 45 year old African American female, who was admitted because of schizoaffective disorder, depressed phase, with psychotic features.  Please refer to the admission note for further details pertaining to the symptoms, circumstances, and history that led to her hospitalization.  She was given an initial Axis I diagnosis of schizoaffective disorder, NOS.  MEDICAL AND LABORATORY:  The patient was medically and physically assessed by the psychiatric nurse practitioner.  She came to [name] with a history of hypertension and diabetes.  She was continued on her usual regimen of Glucophage, Norvasc, Lopressor, and, for GERD, Protonix. There were no significant medical issues during her hospital stay.  HOSPITAL COURSE:  The patient was admitted to the adult inpatient psychiatric service.  She presented as a well-nourished, normally- developed female who was profoundly depressed, withdrawn, negativistic, with disorganized thinking and poverty of speech.  She had been discharged from our inpatient service only a week prior, on a regimen of Depakote, Lexapro, and trazodone.  Upon her return, her urine drug screen was negative.  She had been referred to [facility] in [place].  She apparently had not been compliant with her medications in the interval.  The patient was admitted to the adult inpatient psychiatric service, and she was restarted on Depakote, Lexapro, and  trazodone.  To address psychotic symptoms, Haldol 10 mg at bedtime was  added to her regimen, but the patient initially refused it.  We then subsequently learned from her family that she had had a previous extended period of stability well on a regimen of Geodon.  This led to a discontinuation of Haldol and reinitiation of Geodon.  With Geodon, the patient made gradual but steady progress over the remainder of her hospital stay.  Because of her several hospital admissions, case management worked with the patient towards referral to an assertive  community treatment team, which the patient was open to.  The patient also had a distant history of alcohol dependence, but substance abuse was not at the forefront of this hospital stay.  On the 12th hospital day, the patient indicated that she felt ready for discharge.  Her thinking was much clear, and her  mood was improved, although she was still moderately depressed.  She denied any suicidal ideation, plan, or intent.  She acknowledged the need to remain on her medications to stay well and indicated that she would do so because she was tired of being in the hospital.  She agreed to the following aftercare plan:  AFTERCARE:  The patient was to follow up with [facility] with an appointment on February 1st at a.m.  She was also to be contacted later by an assertive  community treatment team for potential intake.  DISCHARGE MEDICATIONS: 1. Depakote 1000 mg q.h.s. 2. Glucophage 500 mg b.i.d. 3. Geodon 80 mg q.p.m. 4. Protonix 40 mg daily. 5. Lexapro 10 mg daily. 6. Trazodone 100 mg q.h.s. 7. Norvasc 2.5 mg daily. 8. Lopressor 50 mg daily.  DISCHARGE DIAGNOSES:  AXIS I:  Schizoaffective disorder, most recently depressed with mild psychotic features, resolving, and a history of alcohol dependence. AXIS II:  Deferred. AXIS III:  History of hypertension, diabetes, gastroesophageal reflux disease. AXIS IV:  Stressors severe. AXIS V:  Global assessment of functioning on discharge  50.              May 14, 2010     Anselm Jungling, MD     SPB/MEDQ  D:  05/15/2010  T:  05/15/2010  Job:  [id number]  Electronically Signed by Geralyn Flash MD on 05/22/2010 10:52:32 AM

## 2010-05-23 ENCOUNTER — Emergency Department (HOSPITAL_COMMUNITY)
Admission: EM | Admit: 2010-05-23 | Discharge: 2010-05-23 | Disposition: A | Discharge status: Home / Self Care | Payer: Medicare Other | Attending: Emergency Medicine | Admitting: Emergency Medicine

## 2010-05-23 DIAGNOSIS — Z79899 Other long term (current) drug therapy: Secondary | ICD-10-CM | POA: Insufficient documentation

## 2010-05-23 DIAGNOSIS — J029 Acute pharyngitis, unspecified: Secondary | ICD-10-CM | POA: Insufficient documentation

## 2010-05-23 DIAGNOSIS — K219 Gastro-esophageal reflux disease without esophagitis: Secondary | ICD-10-CM | POA: Insufficient documentation

## 2010-05-23 DIAGNOSIS — F329 Major depressive disorder, single episode, unspecified: Secondary | ICD-10-CM | POA: Insufficient documentation

## 2010-05-23 DIAGNOSIS — F3289 Other specified depressive episodes: Secondary | ICD-10-CM | POA: Insufficient documentation

## 2010-05-23 DIAGNOSIS — I10 Essential (primary) hypertension: Secondary | ICD-10-CM | POA: Insufficient documentation

## 2010-05-23 DIAGNOSIS — E785 Hyperlipidemia, unspecified: Secondary | ICD-10-CM | POA: Insufficient documentation

## 2010-05-23 LAB — RAPID STREP SCREEN (MED CTR MEBANE ONLY): Streptococcus, Group A Screen (Direct): NEGATIVE

## 2010-06-16 ENCOUNTER — Telehealth: Payer: Self-pay | Admitting: Family Medicine

## 2010-06-22 NOTE — Progress Notes (Signed)
Summary: last physical  Phone Note Call from Patient   Summary of Call: she wants to know when was her last physical because her new dr. does not have it please call back at  [phone] to let her know Initial call taken by: Lind Guest,  June 16, 2010 10:24 AM  Follow-up for Phone Call        advised last pap 02/2009 Follow-up by: Adella Hare LPN,  June 16, 2010 1:59 PM

## 2010-06-26 LAB — URINALYSIS, ROUTINE W REFLEX MICROSCOPIC
Nitrite: NEGATIVE
Specific Gravity, Urine: >1.03 — ABNORMAL HIGH (ref 1.005–1.030)
Urobilinogen, UA: 0.2 mg/dL (ref 0.0–1.0)

## 2010-06-26 LAB — DIFFERENTIAL
Basophils Absolute: 0 10*3/uL (ref 0.0–0.1)
Basophils Absolute: 0 10*3/uL (ref 0.0–0.1)
Basophils Relative: 0 % (ref 0–1)
Basophils Relative: 0 % (ref 0–1)
Eosinophils Absolute: 0 10*3/uL (ref 0.0–0.7)
Neutro Abs: 4 10*3/uL (ref 1.7–7.7)
Neutrophils Relative %: 64 % (ref 43–77)
Neutrophils Relative %: 54 % (ref 43–77)

## 2010-06-26 LAB — POCT URINALYSIS DIPSTICK
Bilirubin Urine: NEGATIVE
Glucose, UA: 100 mg/dL — AB
Ketones, ur: NEGATIVE mg/dL
Nitrite: NEGATIVE

## 2010-06-26 LAB — URINE CULTURE
Culture  Setup Time: 201112302153
Culture: NO GROWTH

## 2010-06-26 LAB — COMPREHENSIVE METABOLIC PANEL
Alkaline Phosphatase: 67 U/L (ref 39–117)
BUN: 9 mg/dL (ref 6–23)
Chloride: 100 mEq/L (ref 96–112)
Glucose, Bld: 129 mg/dL — ABNORMAL HIGH (ref 70–99)
Potassium: 3.9 mEq/L (ref 3.5–5.1)
Total Bilirubin: 0.1 mg/dL — ABNORMAL LOW (ref 0.3–1.2)

## 2010-06-26 LAB — ELECTROLYTE PANEL
CO2: 26 mEq/L (ref 19–32)
Chloride: 103 mEq/L (ref 96–112)
Potassium: 4.4 mEq/L (ref 3.5–5.1)
Sodium: 139 mEq/L (ref 135–145)

## 2010-06-26 LAB — TSH: TSH: 1.287 u[IU]/mL (ref 0.350–4.500)

## 2010-06-26 LAB — POCT I-STAT, CHEM 8
BUN: 13 mg/dL (ref 6–23)
Creatinine, Ser: 0.6 mg/dL (ref 0.4–1.2)
Potassium: 4 mEq/L (ref 3.5–5.1)
Sodium: 134 mEq/L — ABNORMAL LOW (ref 135–145)

## 2010-06-26 LAB — CBC
HCT: 34.1 % — ABNORMAL LOW (ref 36.0–46.0)
MCH: 30 pg (ref 26.0–34.0)
MCHC: 34.9 g/dL (ref 30.0–36.0)
MCV: 87.2 fL (ref 78.0–100.0)
Platelets: 198 10*3/uL (ref 150–400)
RBC: 4.23 MIL/uL (ref 3.87–5.11)
RBC: 3.91 MIL/uL (ref 3.87–5.11)
RDW: 15.6 % — ABNORMAL HIGH (ref 11.5–15.5)
WBC: 7.3 10*3/uL (ref 4.0–10.5)

## 2010-06-26 LAB — FOLATE RBC: RBC Folate: 246 ng/mL (ref 180–600)

## 2010-06-26 LAB — HEMOGLOBIN A1C
Hgb A1c MFr Bld: 7.9 % — ABNORMAL HIGH (ref <5.7)
Mean Plasma Glucose: 180 mg/dL — ABNORMAL HIGH (ref <117)

## 2010-06-26 LAB — VALPROIC ACID LEVEL
Valproic Acid Lvl: 58 ug/mL (ref 50.0–100.0)
Valproic Acid Lvl: 18 ug/mL — ABNORMAL LOW (ref 50.0–100.0)

## 2010-06-26 LAB — SALICYLATE LEVEL: Salicylate Lvl: <4 mg/dL (ref 2.8–20.0)

## 2010-06-26 LAB — BASIC METABOLIC PANEL
CO2: 19 mEq/L (ref 19–32)
Calcium: 9.7 mg/dL (ref 8.4–10.5)
Creatinine, Ser: 0.93 mg/dL (ref 0.4–1.2)
GFR calc Af Amer: >60 mL/min (ref >60)
Glucose, Bld: 239 mg/dL — ABNORMAL HIGH (ref 70–99)

## 2010-06-26 LAB — RAPID URINE DRUG SCREEN, HOSP PERFORMED: Barbiturates: NOT DETECTED

## 2010-06-26 LAB — VITAMIN B12: Vitamin B-12: 612 pg/mL (ref 211–911)

## 2010-06-27 LAB — DIFFERENTIAL
Basophils Relative: 0 % (ref 0–1)
Basophils Relative: 1 % (ref 0–1)
Eosinophils Relative: 3 % (ref 0–5)
Lymphs Abs: 2.7 10*3/uL (ref 0.7–4.0)
Lymphs Abs: 2.8 10*3/uL (ref 0.7–4.0)
Monocytes Relative: 6 % (ref 3–12)
Monocytes Relative: 7 % (ref 3–12)
Neutro Abs: 3.8 10*3/uL (ref 1.7–7.7)
Neutro Abs: 4.4 10*3/uL (ref 1.7–7.7)
Neutrophils Relative %: 57 % (ref 43–77)

## 2010-06-27 LAB — BASIC METABOLIC PANEL
BUN: 15 mg/dL (ref 6–23)
Chloride: 102 mEq/L (ref 96–112)
Potassium: 3.7 mEq/L (ref 3.5–5.1)
Sodium: 134 mEq/L — ABNORMAL LOW (ref 135–145)

## 2010-06-27 LAB — URINALYSIS, ROUTINE W REFLEX MICROSCOPIC
Glucose, UA: 250 mg/dL — AB
Glucose, UA: NEGATIVE mg/dL
Hgb urine dipstick: NEGATIVE
Leukocytes, UA: NEGATIVE
Nitrite: NEGATIVE
Protein, ur: NEGATIVE mg/dL
Protein, ur: NEGATIVE mg/dL
Specific Gravity, Urine: >1.03 — ABNORMAL HIGH (ref 1.005–1.030)
Urobilinogen, UA: 0.2 mg/dL (ref 0.0–1.0)

## 2010-06-27 LAB — COMPREHENSIVE METABOLIC PANEL
BUN: 15 mg/dL (ref 6–23)
CO2: 27 mEq/L (ref 19–32)
Calcium: 10.2 mg/dL (ref 8.4–10.5)
GFR calc non Af Amer: >60 mL/min (ref >60)
Glucose, Bld: 186 mg/dL — ABNORMAL HIGH (ref 70–99)
Total Protein: 6.8 g/dL (ref 6.0–8.3)

## 2010-06-27 LAB — CBC
HCT: 31.9 % — ABNORMAL LOW (ref 36.0–46.0)
HCT: 36.5 % (ref 36.0–46.0)
Hemoglobin: 10.8 g/dL — ABNORMAL LOW (ref 12.0–15.0)
MCH: 30.5 pg (ref 26.0–34.0)
MCHC: 34.8 g/dL (ref 30.0–36.0)
MCV: 87.5 fL (ref 78.0–100.0)
MCV: 88.7 fL (ref 78.0–100.0)
RBC: 3.59 MIL/uL — ABNORMAL LOW (ref 3.87–5.11)
RDW: 15.2 % (ref 11.5–15.5)
RDW: 16.1 % — ABNORMAL HIGH (ref 11.5–15.5)
WBC: 7.3 10*3/uL (ref 4.0–10.5)

## 2010-06-27 LAB — POCT PREGNANCY, URINE: Preg Test, Ur: NEGATIVE

## 2010-06-27 LAB — URINE MICROSCOPIC-ADD ON

## 2010-06-27 LAB — POCT CARDIAC MARKERS: Troponin i, poc: <0.05 ng/mL (ref 0.00–0.09)

## 2010-06-28 LAB — URINALYSIS, ROUTINE W REFLEX MICROSCOPIC
Bilirubin Urine: NEGATIVE
Glucose, UA: 250 mg/dL — AB
Glucose, UA: >1000 mg/dL — AB
Ketones, ur: NEGATIVE mg/dL
Ketones, ur: NEGATIVE mg/dL
Leukocytes, UA: NEGATIVE
Leukocytes, UA: NEGATIVE
Nitrite: NEGATIVE
Nitrite: NEGATIVE
Specific Gravity, Urine: 1.02 (ref 1.005–1.030)
Specific Gravity, Urine: 1.025 (ref 1.005–1.030)
pH: 5.5 (ref 5.0–8.0)
pH: 5.5 (ref 5.0–8.0)

## 2010-06-28 LAB — DIFFERENTIAL
Basophils Relative: 1 % (ref 0–1)
Lymphocytes Relative: 41 % (ref 12–46)
Lymphs Abs: 2.5 10*3/uL (ref 0.7–4.0)
Lymphs Abs: 3.4 10*3/uL (ref 0.7–4.0)
Monocytes Absolute: 0.6 10*3/uL (ref 0.1–1.0)
Monocytes Relative: 4 % (ref 3–12)
Monocytes Relative: 7 % (ref 3–12)
Neutro Abs: 2.5 10*3/uL (ref 1.7–7.7)
Neutro Abs: 4.2 10*3/uL (ref 1.7–7.7)
Neutrophils Relative %: 45 % (ref 43–77)
Neutrophils Relative %: 49 % (ref 43–77)

## 2010-06-28 LAB — POCT CARDIAC MARKERS
Myoglobin, poc: 18.9 ng/mL (ref 12–200)
Myoglobin, poc: 19.2 ng/mL (ref 12–200)
Myoglobin, poc: 20.5 ng/mL (ref 12–200)
Troponin i, poc: <0.05 ng/mL (ref 0.00–0.09)
Troponin i, poc: <0.05 ng/mL (ref 0.00–0.09)

## 2010-06-28 LAB — CBC
Hemoglobin: 11.2 g/dL — ABNORMAL LOW (ref 12.0–15.0)
MCH: 30.1 pg (ref 26.0–34.0)
MCHC: 33.3 g/dL (ref 30.0–36.0)
MCHC: 34.2 g/dL (ref 30.0–36.0)
MCV: 89.2 fL (ref 78.0–100.0)
Platelets: 127 10*3/uL — ABNORMAL LOW (ref 150–400)
Platelets: 202 10*3/uL (ref 150–400)
RDW: 15.3 % (ref 11.5–15.5)
RDW: 15.5 % (ref 11.5–15.5)
WBC: 8.5 10*3/uL (ref 4.0–10.5)

## 2010-06-28 LAB — COMPREHENSIVE METABOLIC PANEL
ALT: 9 U/L (ref 0–35)
AST: 10 U/L (ref 0–37)
Albumin: 3.5 g/dL (ref 3.5–5.2)
BUN: 19 mg/dL (ref 6–23)
CO2: 25 mEq/L (ref 19–32)
Calcium: 9.1 mg/dL (ref 8.4–10.5)
Calcium: 9.6 mg/dL (ref 8.4–10.5)
Creatinine, Ser: 0.81 mg/dL (ref 0.4–1.2)
Creatinine, Ser: 0.92 mg/dL (ref 0.4–1.2)
GFR calc Af Amer: >60 mL/min (ref >60)
GFR calc non Af Amer: >60 mL/min (ref >60)
Glucose, Bld: 149 mg/dL — ABNORMAL HIGH (ref 70–99)
Potassium: 4.7 mEq/L (ref 3.5–5.1)
Sodium: 134 mEq/L — ABNORMAL LOW (ref 135–145)
Total Protein: 6 g/dL (ref 6.0–8.3)
Total Protein: 6 g/dL (ref 6.0–8.3)

## 2010-06-28 LAB — D-DIMER, QUANTITATIVE: D-Dimer, Quant: 0.3 ug/mL-FEU (ref 0.00–0.48)

## 2010-06-28 LAB — URINE MICROSCOPIC-ADD ON

## 2010-06-28 LAB — GLUCOSE, CAPILLARY: Glucose-Capillary: 289 mg/dL — ABNORMAL HIGH (ref 70–99)

## 2010-06-28 LAB — URINE CULTURE

## 2010-06-28 LAB — LIPASE, BLOOD: Lipase: 43 U/L (ref 11–59)

## 2010-06-29 LAB — URINE MICROSCOPIC-ADD ON

## 2010-06-29 LAB — GLUCOSE, CAPILLARY
Glucose-Capillary: 119 mg/dL — ABNORMAL HIGH (ref 70–99)
Glucose-Capillary: 136 mg/dL — ABNORMAL HIGH (ref 70–99)
Glucose-Capillary: 140 mg/dL — ABNORMAL HIGH (ref 70–99)
Glucose-Capillary: 142 mg/dL — ABNORMAL HIGH (ref 70–99)
Glucose-Capillary: 152 mg/dL — ABNORMAL HIGH (ref 70–99)
Glucose-Capillary: 155 mg/dL — ABNORMAL HIGH (ref 70–99)
Glucose-Capillary: 162 mg/dL — ABNORMAL HIGH (ref 70–99)
Glucose-Capillary: 209 mg/dL — ABNORMAL HIGH (ref 70–99)
Glucose-Capillary: 214 mg/dL — ABNORMAL HIGH (ref 70–99)
Glucose-Capillary: 227 mg/dL — ABNORMAL HIGH (ref 70–99)
Glucose-Capillary: 231 mg/dL — ABNORMAL HIGH (ref 70–99)
Glucose-Capillary: 237 mg/dL — ABNORMAL HIGH (ref 70–99)
Glucose-Capillary: 251 mg/dL — ABNORMAL HIGH (ref 70–99)

## 2010-06-29 LAB — CBC
HCT: 29.5 % — ABNORMAL LOW (ref 36.0–46.0)
HCT: 34.1 % — ABNORMAL LOW (ref 36.0–46.0)
HCT: 33.2 % — ABNORMAL LOW (ref 36.0–46.0)
HCT: 34.5 % — ABNORMAL LOW (ref 36.0–46.0)
HCT: 34.4 % — ABNORMAL LOW (ref 36.0–46.0)
HCT: 36 % (ref 36.0–46.0)
Hemoglobin: 11.4 g/dL — ABNORMAL LOW (ref 12.0–15.0)
Hemoglobin: 10.2 g/dL — ABNORMAL LOW (ref 12.0–15.0)
Hemoglobin: 9.5 g/dL — ABNORMAL LOW (ref 12.0–15.0)
Hemoglobin: 11.3 g/dL — ABNORMAL LOW (ref 12.0–15.0)
Hemoglobin: 12 g/dL (ref 12.0–15.0)
MCH: 28.1 pg (ref 26.0–34.0)
MCH: 28.3 pg (ref 26.0–34.0)
MCH: 29.4 pg (ref 26.0–34.0)
MCH: 29.5 pg (ref 26.0–34.0)
MCHC: 33.3 g/dL (ref 30.0–36.0)
MCHC: 34 g/dL (ref 30.0–36.0)
MCHC: 33.3 g/dL (ref 30.0–36.0)
MCV: 90.2 fL (ref 78.0–100.0)
MCV: 85.8 fL (ref 78.0–100.0)
MCV: 86.2 fL (ref 78.0–100.0)
MCV: 89.8 fL (ref 78.0–100.0)
Platelets: 220 10*3/uL (ref 150–400)
Platelets: 307 10*3/uL (ref 150–400)
Platelets: 342 10*3/uL (ref 150–400)
Platelets: 137 10*3/uL — ABNORMAL LOW (ref 150–400)
RBC: 3.36 MIL/uL — ABNORMAL LOW (ref 3.87–5.11)
RBC: 3.47 MIL/uL — ABNORMAL LOW (ref 3.87–5.11)
RBC: 3.85 MIL/uL — ABNORMAL LOW (ref 3.87–5.11)
RBC: 4.02 MIL/uL (ref 3.87–5.11)
RBC: 3.83 MIL/uL — ABNORMAL LOW (ref 3.87–5.11)
RBC: 3.95 MIL/uL (ref 3.87–5.11)
RDW: 16.3 % — ABNORMAL HIGH (ref 11.5–15.5)
RDW: 16.1 % — ABNORMAL HIGH (ref 11.5–15.5)
RDW: 15.6 % — ABNORMAL HIGH (ref 11.5–15.5)
WBC: 5.7 10*3/uL (ref 4.0–10.5)
WBC: 15.3 10*3/uL — ABNORMAL HIGH (ref 4.0–10.5)
WBC: 7.3 10*3/uL (ref 4.0–10.5)
WBC: 9.8 10*3/uL (ref 4.0–10.5)
WBC: 7 10*3/uL (ref 4.0–10.5)
WBC: 8.2 10*3/uL (ref 4.0–10.5)

## 2010-06-29 LAB — HEMOGLOBIN A1C: Hgb A1c MFr Bld: 6.9 % — ABNORMAL HIGH (ref <5.7)

## 2010-06-29 LAB — COMPREHENSIVE METABOLIC PANEL
Albumin: 2.9 g/dL — ABNORMAL LOW (ref 3.5–5.2)
Albumin: 3.2 g/dL — ABNORMAL LOW (ref 3.5–5.2)
Alkaline Phosphatase: 61 U/L (ref 39–117)
Alkaline Phosphatase: 51 U/L (ref 39–117)
BUN: 11 mg/dL (ref 6–23)
BUN: 11 mg/dL (ref 6–23)
CO2: 31 mEq/L (ref 19–32)
CO2: 22 mEq/L (ref 19–32)
Chloride: 97 mEq/L (ref 96–112)
Chloride: 106 mEq/L (ref 96–112)
Creatinine, Ser: 0.85 mg/dL (ref 0.4–1.2)
Creatinine, Ser: 0.59 mg/dL (ref 0.4–1.2)
GFR calc non Af Amer: >60 mL/min (ref >60)
GFR calc non Af Amer: >60 mL/min (ref >60)
Glucose, Bld: 122 mg/dL — ABNORMAL HIGH (ref 70–99)
Potassium: 3.9 mEq/L (ref 3.5–5.1)
Potassium: 4 mEq/L (ref 3.5–5.1)
Total Bilirubin: 0.6 mg/dL (ref 0.3–1.2)
Total Bilirubin: 0.2 mg/dL — ABNORMAL LOW (ref 0.3–1.2)

## 2010-06-29 LAB — URINALYSIS, ROUTINE W REFLEX MICROSCOPIC
Bilirubin Urine: NEGATIVE
Bilirubin Urine: NEGATIVE
Glucose, UA: NEGATIVE mg/dL
Glucose, UA: >1000 mg/dL — AB
Hgb urine dipstick: NEGATIVE
Leukocytes, UA: NEGATIVE
Nitrite: NEGATIVE
Nitrite: NEGATIVE
Nitrite: NEGATIVE
Protein, ur: NEGATIVE mg/dL
Specific Gravity, Urine: 1.02 (ref 1.005–1.030)
Specific Gravity, Urine: 1.025 (ref 1.005–1.030)
Specific Gravity, Urine: 1.023 (ref 1.005–1.030)
Urobilinogen, UA: 0.2 mg/dL (ref 0.0–1.0)
Urobilinogen, UA: 0.2 mg/dL (ref 0.0–1.0)
pH: 6 (ref 5.0–8.0)
pH: 6 (ref 5.0–8.0)

## 2010-06-29 LAB — BASIC METABOLIC PANEL
BUN: 3 mg/dL — ABNORMAL LOW (ref 6–23)
BUN: 13 mg/dL (ref 6–23)
CO2: 23 mEq/L (ref 19–32)
CO2: 25 mEq/L (ref 19–32)
CO2: 29 mEq/L (ref 19–32)
Calcium: 8.3 mg/dL — ABNORMAL LOW (ref 8.4–10.5)
Calcium: 8.3 mg/dL — ABNORMAL LOW (ref 8.4–10.5)
Calcium: 8.4 mg/dL (ref 8.4–10.5)
Chloride: 103 mEq/L (ref 96–112)
Chloride: 100 mEq/L (ref 96–112)
Chloride: 105 mEq/L (ref 96–112)
Chloride: 109 mEq/L (ref 96–112)
Chloride: 102 mEq/L (ref 96–112)
Creatinine, Ser: 0.71 mg/dL (ref 0.4–1.2)
GFR calc Af Amer: >60 mL/min (ref >60)
GFR calc Af Amer: >60 mL/min (ref >60)
GFR calc Af Amer: >60 mL/min (ref >60)
GFR calc Af Amer: >60 mL/min (ref >60)
GFR calc non Af Amer: >60 mL/min (ref >60)
GFR calc non Af Amer: >60 mL/min (ref >60)
GFR calc non Af Amer: >60 mL/min (ref >60)
GFR calc non Af Amer: >60 mL/min (ref >60)
GFR calc non Af Amer: >60 mL/min (ref >60)
GFR calc non Af Amer: >60 mL/min (ref >60)
GFR calc non Af Amer: >60 mL/min (ref >60)
Glucose, Bld: 142 mg/dL — ABNORMAL HIGH (ref 70–99)
Glucose, Bld: 184 mg/dL — ABNORMAL HIGH (ref 70–99)
Glucose, Bld: 136 mg/dL — ABNORMAL HIGH (ref 70–99)
Glucose, Bld: 371 mg/dL — ABNORMAL HIGH (ref 70–99)
Potassium: 2.6 mEq/L — CL (ref 3.5–5.1)
Potassium: 3.1 mEq/L — ABNORMAL LOW (ref 3.5–5.1)
Potassium: 2.6 mEq/L — CL (ref 3.5–5.1)
Potassium: 3.5 mEq/L (ref 3.5–5.1)
Potassium: 3.8 mEq/L (ref 3.5–5.1)
Potassium: 4.2 mEq/L (ref 3.5–5.1)
Potassium: 3.8 mEq/L (ref 3.5–5.1)
Sodium: 134 mEq/L — ABNORMAL LOW (ref 135–145)
Sodium: 136 mEq/L (ref 135–145)
Sodium: 139 mEq/L (ref 135–145)
Sodium: 140 mEq/L (ref 135–145)
Sodium: 140 mEq/L (ref 135–145)
Sodium: 140 mEq/L (ref 135–145)
Sodium: 141 mEq/L (ref 135–145)
Sodium: 144 mEq/L (ref 135–145)
Sodium: 136 mEq/L (ref 135–145)

## 2010-06-29 LAB — CARDIAC PANEL(CRET KIN+CKTOT+MB+TROPI)
CK, MB: 1 ng/mL (ref 0.3–4.0)
Relative Index: 0.3 (ref 0.0–2.5)
Relative Index: 0.5 (ref 0.0–2.5)
Total CK: 240 U/L — ABNORMAL HIGH (ref 7–177)
Troponin I: 0.01 ng/mL (ref 0.00–0.06)
Troponin I: 0.02 ng/mL (ref 0.00–0.06)
Troponin I: 0.02 ng/mL (ref 0.00–0.06)

## 2010-06-29 LAB — POCT CARDIAC MARKERS: Troponin i, poc: <0.05 ng/mL (ref 0.00–0.09)

## 2010-06-29 LAB — MAGNESIUM
Magnesium: 1.7 mg/dL (ref 1.5–2.5)
Magnesium: 1.8 mg/dL (ref 1.5–2.5)
Magnesium: 2 mg/dL (ref 1.5–2.5)
Magnesium: 2.1 mg/dL (ref 1.5–2.5)

## 2010-06-29 LAB — PROCALCITONIN
Procalcitonin: 0.19 ng/mL
Procalcitonin: 0.2 ng/mL
Procalcitonin: <0.1 ng/mL

## 2010-06-29 LAB — VALPROIC ACID LEVEL
Valproic Acid Lvl: 70.9 ug/mL (ref 50.0–100.0)
Valproic Acid Lvl: 41 ug/mL — ABNORMAL LOW (ref 50.0–100.0)

## 2010-06-29 LAB — DIFFERENTIAL
Basophils Absolute: 0 10*3/uL (ref 0.0–0.1)
Basophils Absolute: 0 10*3/uL (ref 0.0–0.1)
Basophils Absolute: 0.1 10*3/uL (ref 0.0–0.1)
Basophils Relative: 0 % (ref 0–1)
Basophils Relative: 0 % (ref 0–1)
Basophils Relative: 0 % (ref 0–1)
Eosinophils Relative: 0 % (ref 0–5)
Eosinophils Relative: 2 % (ref 0–5)
Lymphocytes Relative: 16 % (ref 12–46)
Lymphocytes Relative: 17 % (ref 12–46)
Lymphocytes Relative: 33 % (ref 12–46)
Lymphocytes Relative: 35 % (ref 12–46)
Lymphs Abs: 2.8 10*3/uL (ref 0.7–4.0)
Monocytes Absolute: 0.1 10*3/uL (ref 0.1–1.0)
Monocytes Absolute: 0.4 10*3/uL (ref 0.1–1.0)
Monocytes Absolute: 0.4 10*3/uL (ref 0.1–1.0)
Monocytes Relative: 1 % — ABNORMAL LOW (ref 3–12)
Monocytes Relative: 5 % (ref 3–12)
Neutro Abs: 4.5 10*3/uL (ref 1.7–7.7)
Neutro Abs: 8.7 10*3/uL — ABNORMAL HIGH (ref 1.7–7.7)
Neutro Abs: 4.1 10*3/uL (ref 1.7–7.7)
Neutro Abs: 4.4 10*3/uL (ref 1.7–7.7)
Neutrophils Relative %: 79 % — ABNORMAL HIGH (ref 43–77)

## 2010-06-29 LAB — CULTURE, BLOOD (ROUTINE X 2)
Culture  Setup Time: 201109200227
Culture: NO GROWTH
Culture: NO GROWTH

## 2010-06-29 LAB — URINE CULTURE: Special Requests: NEGATIVE

## 2010-06-29 LAB — BLOOD GAS, ARTERIAL
Bicarbonate: 19.5 mEq/L — ABNORMAL LOW (ref 20.0–24.0)
pCO2 arterial: 32.6 mmHg — ABNORMAL LOW (ref 35.0–45.0)
pH, Arterial: 7.394 (ref 7.350–7.400)
pO2, Arterial: 99.1 mmHg (ref 80.0–100.0)

## 2010-06-29 LAB — PHOSPHORUS
Phosphorus: 2.9 mg/dL (ref 2.3–4.6)
Phosphorus: 3.8 mg/dL (ref 2.3–4.6)

## 2010-06-29 LAB — LEGIONELLA ANTIGEN, URINE: Legionella Antigen, Urine: NEGATIVE

## 2010-06-29 LAB — RAPID URINE DRUG SCREEN, HOSP PERFORMED
Amphetamines: NOT DETECTED
Barbiturates: NOT DETECTED
Tetrahydrocannabinol: NOT DETECTED

## 2010-06-29 LAB — POCT I-STAT 3, ART BLOOD GAS (G3+)
Acid-base deficit: 3 mmol/L — ABNORMAL HIGH (ref 0.0–2.0)
O2 Saturation: 88 %

## 2010-06-29 LAB — INFLUENZA PANEL BY PCR (TYPE A & B): H1N1 flu by pcr: NOT DETECTED

## 2010-06-29 LAB — PREGNANCY, URINE: Preg Test, Ur: NEGATIVE

## 2010-06-29 LAB — BRAIN NATRIURETIC PEPTIDE
Pro B Natriuretic peptide (BNP): 105 pg/mL — ABNORMAL HIGH (ref 0.0–100.0)
Pro B Natriuretic peptide (BNP): 39 pg/mL (ref 0.0–100.0)

## 2010-06-29 LAB — STREP PNEUMONIAE URINARY ANTIGEN: Strep Pneumo Urinary Antigen: NEGATIVE

## 2010-07-01 LAB — CBC
MCH: 30.8 pg (ref 26.0–34.0)
MCV: 90.5 fL (ref 78.0–100.0)
Platelets: 152 10*3/uL (ref 150–400)
RDW: 14.6 % (ref 11.5–15.5)
WBC: 7.1 10*3/uL (ref 4.0–10.5)

## 2010-07-01 LAB — BASIC METABOLIC PANEL
BUN: 9 mg/dL (ref 6–23)
Calcium: 8.9 mg/dL (ref 8.4–10.5)
Chloride: 98 mEq/L (ref 96–112)
Creatinine, Ser: 0.88 mg/dL (ref 0.4–1.2)

## 2010-07-01 LAB — DIFFERENTIAL
Basophils Absolute: 0 10*3/uL (ref 0.0–0.1)
Eosinophils Absolute: 0.2 10*3/uL (ref 0.0–0.7)
Eosinophils Relative: 3 % (ref 0–5)
Lymphocytes Relative: 41 % (ref 12–46)
Neutrophils Relative %: 50 % (ref 43–77)

## 2010-07-01 LAB — URINALYSIS, ROUTINE W REFLEX MICROSCOPIC
Bilirubin Urine: NEGATIVE
Ketones, ur: NEGATIVE mg/dL
Nitrite: NEGATIVE
Urobilinogen, UA: 0.2 mg/dL (ref 0.0–1.0)

## 2010-07-01 LAB — ETHANOL: Alcohol, Ethyl (B): <5 mg/dL (ref 0–10)

## 2010-07-01 LAB — RAPID URINE DRUG SCREEN, HOSP PERFORMED: Tetrahydrocannabinol: NOT DETECTED

## 2010-07-02 LAB — GLUCOSE, CAPILLARY: Glucose-Capillary: 87 mg/dL (ref 70–99)

## 2010-07-02 LAB — URINALYSIS, ROUTINE W REFLEX MICROSCOPIC
Bilirubin Urine: NEGATIVE
Hgb urine dipstick: NEGATIVE
Ketones, ur: NEGATIVE mg/dL
Protein, ur: NEGATIVE mg/dL
Urobilinogen, UA: 0.2 mg/dL (ref 0.0–1.0)

## 2010-07-02 LAB — GC/CHLAMYDIA PROBE AMP, GENITAL
Chlamydia, DNA Probe: NEGATIVE
GC Probe Amp, Genital: NEGATIVE

## 2010-07-02 LAB — WET PREP, GENITAL: Trich, Wet Prep: NONE SEEN

## 2010-07-22 LAB — CBC
HCT: 36 % (ref 36.0–46.0)
HCT: 36.1 % (ref 36.0–46.0)
Hemoglobin: 12.2 g/dL (ref 12.0–15.0)
MCV: 93.1 fL (ref 78.0–100.0)
Platelets: 130 10*3/uL — ABNORMAL LOW (ref 150–400)
Platelets: 139 10*3/uL — ABNORMAL LOW (ref 150–400)
Platelets: 160 10*3/uL (ref 150–400)
RDW: 15.3 % (ref 11.5–15.5)
RDW: 15.7 % — ABNORMAL HIGH (ref 11.5–15.5)
WBC: 8 10*3/uL (ref 4.0–10.5)
WBC: 8 10*3/uL (ref 4.0–10.5)

## 2010-07-22 LAB — URINALYSIS, ROUTINE W REFLEX MICROSCOPIC
Bilirubin Urine: NEGATIVE
Bilirubin Urine: NEGATIVE
Glucose, UA: NEGATIVE mg/dL
Ketones, ur: NEGATIVE mg/dL
Ketones, ur: NEGATIVE mg/dL
Nitrite: NEGATIVE
Nitrite: NEGATIVE
Specific Gravity, Urine: 1.01 (ref 1.005–1.030)
Specific Gravity, Urine: 1.025 (ref 1.005–1.030)
pH: 5 (ref 5.0–8.0)
pH: 5.5 (ref 5.0–8.0)
pH: 6.5 (ref 5.0–8.0)

## 2010-07-22 LAB — BASIC METABOLIC PANEL
BUN: 6 mg/dL (ref 6–23)
BUN: 8 mg/dL (ref 6–23)
Chloride: 102 mEq/L (ref 96–112)
GFR calc non Af Amer: >60 mL/min (ref >60)
Glucose, Bld: 94 mg/dL (ref 70–99)
Potassium: 3 mEq/L — ABNORMAL LOW (ref 3.5–5.1)
Potassium: 3.4 mEq/L — ABNORMAL LOW (ref 3.5–5.1)
Sodium: 130 mEq/L — ABNORMAL LOW (ref 135–145)

## 2010-07-22 LAB — RAPID URINE DRUG SCREEN, HOSP PERFORMED
Amphetamines: NOT DETECTED
Barbiturates: NOT DETECTED
Barbiturates: NOT DETECTED
Cocaine: NOT DETECTED
Cocaine: NOT DETECTED
Opiates: NOT DETECTED
Opiates: POSITIVE — AB
Tetrahydrocannabinol: NOT DETECTED
Tetrahydrocannabinol: NOT DETECTED

## 2010-07-22 LAB — POCT CARDIAC MARKERS
CKMB, poc: <1 ng/mL — ABNORMAL LOW (ref 1.0–8.0)
CKMB, poc: <1 ng/mL — ABNORMAL LOW (ref 1.0–8.0)
Myoglobin, poc: 25 ng/mL (ref 12–200)
Myoglobin, poc: 40.4 ng/mL (ref 12–200)
Troponin i, poc: <0.05 ng/mL (ref 0.00–0.09)
Troponin i, poc: <0.05 ng/mL (ref 0.00–0.09)

## 2010-07-22 LAB — URINE MICROSCOPIC-ADD ON

## 2010-07-22 LAB — DIFFERENTIAL
Basophils Absolute: 0 10*3/uL (ref 0.0–0.1)
Basophils Absolute: 0 10*3/uL (ref 0.0–0.1)
Basophils Relative: 1 % (ref 0–1)
Eosinophils Absolute: 0.5 10*3/uL (ref 0.0–0.7)
Eosinophils Relative: 3 % (ref 0–5)
Eosinophils Relative: 5 % (ref 0–5)
Eosinophils Relative: 7 % — ABNORMAL HIGH (ref 0–5)
Lymphocytes Relative: 38 % (ref 12–46)
Lymphs Abs: 2.2 10*3/uL (ref 0.7–4.0)
Lymphs Abs: 3.1 10*3/uL (ref 0.7–4.0)
Monocytes Absolute: 0.4 10*3/uL (ref 0.1–1.0)
Monocytes Absolute: 0.4 10*3/uL (ref 0.1–1.0)
Monocytes Relative: 5 % (ref 3–12)

## 2010-07-22 LAB — GLUCOSE, CAPILLARY
Glucose-Capillary: 106 mg/dL — ABNORMAL HIGH (ref 70–99)
Glucose-Capillary: 108 mg/dL — ABNORMAL HIGH (ref 70–99)
Glucose-Capillary: 69 mg/dL — ABNORMAL LOW (ref 70–99)

## 2010-07-22 LAB — COMPREHENSIVE METABOLIC PANEL
AST: 22 U/L (ref 0–37)
Albumin: 3.4 g/dL — ABNORMAL LOW (ref 3.5–5.2)
Alkaline Phosphatase: 71 U/L (ref 39–117)
Chloride: 104 mEq/L (ref 96–112)
GFR calc Af Amer: >60 mL/min (ref >60)
Potassium: 3.9 mEq/L (ref 3.5–5.1)
Sodium: 137 mEq/L (ref 135–145)
Total Bilirubin: 0.1 mg/dL — ABNORMAL LOW (ref 0.3–1.2)
Total Protein: 6.3 g/dL (ref 6.0–8.3)

## 2010-07-22 LAB — PREGNANCY, URINE: Preg Test, Ur: NEGATIVE

## 2010-07-22 LAB — ETHANOL: Alcohol, Ethyl (B): <5 mg/dL (ref 0–10)

## 2010-07-23 LAB — CBC
MCHC: 33.9 g/dL (ref 30.0–36.0)
Platelets: 165 10*3/uL (ref 150–400)
RDW: 16.6 % — ABNORMAL HIGH (ref 11.5–15.5)

## 2010-07-23 LAB — COMPREHENSIVE METABOLIC PANEL
ALT: 24 U/L (ref 0–35)
AST: 34 U/L (ref 0–37)
Albumin: 3.2 g/dL — ABNORMAL LOW (ref 3.5–5.2)
Alkaline Phosphatase: 63 U/L (ref 39–117)
Calcium: 9.1 mg/dL (ref 8.4–10.5)
GFR calc Af Amer: >60 mL/min (ref >60)
Glucose, Bld: 175 mg/dL — ABNORMAL HIGH (ref 70–99)
Potassium: 3.5 mEq/L (ref 3.5–5.1)
Sodium: 136 mEq/L (ref 135–145)
Total Protein: 5.8 g/dL — ABNORMAL LOW (ref 6.0–8.3)

## 2010-07-23 LAB — DIFFERENTIAL
Eosinophils Absolute: 0.5 10*3/uL (ref 0.0–0.7)
Lymphs Abs: 1.9 10*3/uL (ref 0.7–4.0)
Monocytes Absolute: 0.4 10*3/uL (ref 0.1–1.0)
Monocytes Relative: 6 % (ref 3–12)
Neutrophils Relative %: 55 % (ref 43–77)

## 2010-07-23 LAB — URINALYSIS, ROUTINE W REFLEX MICROSCOPIC
Hgb urine dipstick: NEGATIVE
Nitrite: NEGATIVE
Specific Gravity, Urine: 1.02 (ref 1.005–1.030)
Urobilinogen, UA: 0.2 mg/dL (ref 0.0–1.0)
pH: 5.5 (ref 5.0–8.0)

## 2010-07-23 LAB — VALPROIC ACID LEVEL: Valproic Acid Lvl: 61 ug/mL (ref 50.0–100.0)

## 2010-07-24 LAB — COMPREHENSIVE METABOLIC PANEL
ALT: 42 U/L — ABNORMAL HIGH (ref 0–35)
AST: 44 U/L — ABNORMAL HIGH (ref 0–37)
AST: 46 U/L — ABNORMAL HIGH (ref 0–37)
Albumin: 3.3 g/dL — ABNORMAL LOW (ref 3.5–5.2)
Alkaline Phosphatase: 85 U/L (ref 39–117)
Alkaline Phosphatase: 87 U/L (ref 39–117)
CO2: 25 mEq/L (ref 19–32)
Chloride: 100 mEq/L (ref 96–112)
Chloride: 99 mEq/L (ref 96–112)
Creatinine, Ser: 0.91 mg/dL (ref 0.4–1.2)
GFR calc Af Amer: >60 mL/min (ref >60)
GFR calc Af Amer: >60 mL/min (ref >60)
GFR calc non Af Amer: >60 mL/min (ref >60)
Potassium: 3.1 mEq/L — ABNORMAL LOW (ref 3.5–5.1)
Sodium: 135 mEq/L (ref 135–145)
Total Bilirubin: 0.2 mg/dL — ABNORMAL LOW (ref 0.3–1.2)
Total Bilirubin: 0.4 mg/dL (ref 0.3–1.2)
Total Protein: 6.2 g/dL (ref 6.0–8.3)

## 2010-07-24 LAB — BASIC METABOLIC PANEL
BUN: 7 mg/dL (ref 6–23)
BUN: 10 mg/dL (ref 6–23)
CO2: 25 mEq/L (ref 19–32)
Calcium: 9.2 mg/dL (ref 8.4–10.5)
Chloride: 99 mEq/L (ref 96–112)
Creatinine, Ser: 1.01 mg/dL (ref 0.4–1.2)
Creatinine, Ser: 1.05 mg/dL (ref 0.4–1.2)
Creatinine, Ser: 0.88 mg/dL (ref 0.4–1.2)
GFR calc Af Amer: >60 mL/min (ref >60)
GFR calc non Af Amer: >60 mL/min (ref >60)
GFR calc non Af Amer: >60 mL/min (ref >60)
Glucose, Bld: 128 mg/dL — ABNORMAL HIGH (ref 70–99)
Glucose, Bld: 126 mg/dL — ABNORMAL HIGH (ref 70–99)
Potassium: 3.3 mEq/L — ABNORMAL LOW (ref 3.5–5.1)

## 2010-07-24 LAB — URINALYSIS, ROUTINE W REFLEX MICROSCOPIC
Nitrite: NEGATIVE
Nitrite: NEGATIVE
Protein, ur: NEGATIVE mg/dL
Specific Gravity, Urine: >1.03 — ABNORMAL HIGH (ref 1.005–1.030)
Specific Gravity, Urine: <1.005 — ABNORMAL LOW (ref 1.005–1.030)
Urobilinogen, UA: 0.2 mg/dL (ref 0.0–1.0)
Urobilinogen, UA: 0.2 mg/dL (ref 0.0–1.0)

## 2010-07-24 LAB — TSH: TSH: 1.963 u[IU]/mL (ref 0.350–4.500)

## 2010-07-24 LAB — GLUCOSE, CAPILLARY
Glucose-Capillary: 138 mg/dL — ABNORMAL HIGH (ref 70–99)
Glucose-Capillary: 186 mg/dL — ABNORMAL HIGH (ref 70–99)

## 2010-07-24 LAB — URINE MICROSCOPIC-ADD ON

## 2010-07-24 LAB — CBC
HCT: 35.9 % — ABNORMAL LOW (ref 36.0–46.0)
HCT: 36.3 % (ref 36.0–46.0)
HCT: 36.6 % (ref 36.0–46.0)
Hemoglobin: 12.6 g/dL (ref 12.0–15.0)
MCHC: 34.8 g/dL (ref 30.0–36.0)
MCV: 88.6 fL (ref 78.0–100.0)
MCV: 88.9 fL (ref 78.0–100.0)
MCV: 87.2 fL (ref 78.0–100.0)
Platelets: 178 10*3/uL (ref 150–400)
Platelets: 180 10*3/uL (ref 150–400)
Platelets: 185 10*3/uL (ref 150–400)
RBC: 4.09 MIL/uL (ref 3.87–5.11)
RDW: 16 % — ABNORMAL HIGH (ref 11.5–15.5)
RDW: 16.4 % — ABNORMAL HIGH (ref 11.5–15.5)
RDW: 16 % — ABNORMAL HIGH (ref 11.5–15.5)
WBC: 10.9 10*3/uL — ABNORMAL HIGH (ref 4.0–10.5)
WBC: 8.1 10*3/uL (ref 4.0–10.5)
WBC: 9.2 10*3/uL (ref 4.0–10.5)

## 2010-07-24 LAB — DIFFERENTIAL
Basophils Absolute: 0.1 10*3/uL (ref 0.0–0.1)
Basophils Absolute: 0 10*3/uL (ref 0.0–0.1)
Basophils Absolute: 0 10*3/uL (ref 0.0–0.1)
Basophils Relative: 1 % (ref 0–1)
Basophils Relative: 1 % (ref 0–1)
Basophils Relative: 0 % (ref 0–1)
Eosinophils Absolute: 0.6 10*3/uL (ref 0.0–0.7)
Eosinophils Absolute: 0.8 10*3/uL — ABNORMAL HIGH (ref 0.0–0.7)
Eosinophils Absolute: 0.2 10*3/uL (ref 0.0–0.7)
Eosinophils Relative: 4 % (ref 0–5)
Eosinophils Relative: 9 % — ABNORMAL HIGH (ref 0–5)
Eosinophils Relative: 2 % (ref 0–5)
Lymphocytes Relative: 23 % (ref 12–46)
Lymphocytes Relative: 35 % (ref 12–46)
Lymphs Abs: 1.9 10*3/uL (ref 0.7–4.0)
Monocytes Absolute: 0.3 10*3/uL (ref 0.1–1.0)
Monocytes Absolute: 0.6 10*3/uL (ref 0.1–1.0)
Monocytes Relative: 3 % (ref 3–12)
Neutro Abs: 5.3 10*3/uL (ref 1.7–7.7)
Neutro Abs: 7.8 10*3/uL — ABNORMAL HIGH (ref 1.7–7.7)
Neutrophils Relative %: 57 % (ref 43–77)
Neutrophils Relative %: 67 % (ref 43–77)

## 2010-07-24 LAB — PREGNANCY, URINE: Preg Test, Ur: NEGATIVE

## 2010-07-24 LAB — ETHANOL
Alcohol, Ethyl (B): 12 mg/dL — ABNORMAL HIGH (ref 0–10)
Alcohol, Ethyl (B): <5 mg/dL (ref 0–10)

## 2010-07-24 LAB — HEPATIC FUNCTION PANEL
ALT: 24 U/L (ref 0–35)
AST: 41 U/L — ABNORMAL HIGH (ref 0–37)
Albumin: 2.9 g/dL — ABNORMAL LOW (ref 3.5–5.2)
Alkaline Phosphatase: 72 U/L (ref 39–117)
Alkaline Phosphatase: 60 U/L (ref 39–117)
Bilirubin, Direct: 0.1 mg/dL (ref 0.0–0.3)
Indirect Bilirubin: 0.1 mg/dL — ABNORMAL LOW (ref 0.3–0.9)
Total Protein: 5.4 g/dL — ABNORMAL LOW (ref 6.0–8.3)
Total Protein: 6.2 g/dL (ref 6.0–8.3)

## 2010-07-24 LAB — RAPID URINE DRUG SCREEN, HOSP PERFORMED
Amphetamines: NOT DETECTED
Benzodiazepines: NOT DETECTED
Cocaine: NOT DETECTED
Tetrahydrocannabinol: NOT DETECTED

## 2010-07-24 LAB — LIPASE, BLOOD: Lipase: 28 U/L (ref 11–59)

## 2010-07-24 LAB — POCT I-STAT 4, (NA,K, GLUC, HGB,HCT)
HCT: 43 % (ref 36.0–46.0)
Hemoglobin: 14.6 g/dL (ref 12.0–15.0)
Potassium: 3.6 mEq/L (ref 3.5–5.1)

## 2010-07-25 LAB — POCT CARDIAC MARKERS: CKMB, poc: <1 ng/mL — ABNORMAL LOW (ref 1.0–8.0)

## 2010-08-01 LAB — URINALYSIS, ROUTINE W REFLEX MICROSCOPIC
Bilirubin Urine: NEGATIVE
Leukocytes, UA: NEGATIVE
Nitrite: NEGATIVE
Specific Gravity, Urine: >1.03 — ABNORMAL HIGH (ref 1.005–1.030)
pH: 5.5 (ref 5.0–8.0)

## 2010-08-01 LAB — CBC
HCT: 32.6 % — ABNORMAL LOW (ref 36.0–46.0)
HCT: 38.8 % (ref 36.0–46.0)
MCHC: 32.9 g/dL (ref 30.0–36.0)
MCV: 85.5 fL (ref 78.0–100.0)
MCV: 86.4 fL (ref 78.0–100.0)
Platelets: 197 10*3/uL (ref 150–400)
Platelets: 280 10*3/uL (ref 150–400)
RDW: 16.2 % — ABNORMAL HIGH (ref 11.5–15.5)
RDW: 17.3 % — ABNORMAL HIGH (ref 11.5–15.5)

## 2010-08-01 LAB — COMPREHENSIVE METABOLIC PANEL
Albumin: 3.6 g/dL (ref 3.5–5.2)
BUN: 12 mg/dL (ref 6–23)
Calcium: 9.4 mg/dL (ref 8.4–10.5)
Chloride: 99 mEq/L (ref 96–112)
Creatinine, Ser: 0.79 mg/dL (ref 0.4–1.2)
Total Bilirubin: 0.2 mg/dL — ABNORMAL LOW (ref 0.3–1.2)
Total Protein: 6.3 g/dL (ref 6.0–8.3)

## 2010-08-01 LAB — BASIC METABOLIC PANEL
BUN: 6 mg/dL (ref 6–23)
Chloride: 103 mEq/L (ref 96–112)
Creatinine, Ser: 0.75 mg/dL (ref 0.4–1.2)
Glucose, Bld: 224 mg/dL — ABNORMAL HIGH (ref 70–99)
Potassium: 3.7 mEq/L (ref 3.5–5.1)

## 2010-08-01 LAB — DIFFERENTIAL
Basophils Absolute: 0 10*3/uL (ref 0.0–0.1)
Basophils Absolute: 0.1 10*3/uL (ref 0.0–0.1)
Basophils Relative: 0 % (ref 0–1)
Eosinophils Absolute: 0.2 10*3/uL (ref 0.0–0.7)
Eosinophils Relative: 2 % (ref 0–5)
Lymphocytes Relative: 41 % (ref 12–46)
Monocytes Absolute: 0.5 10*3/uL (ref 0.1–1.0)
Neutro Abs: 5.3 10*3/uL (ref 1.7–7.7)
Neutrophils Relative %: 79 % — ABNORMAL HIGH (ref 43–77)

## 2010-08-01 LAB — GLUCOSE, CAPILLARY: Glucose-Capillary: 95 mg/dL (ref 70–99)

## 2010-08-01 LAB — URINE MICROSCOPIC-ADD ON

## 2010-08-01 LAB — CK TOTAL AND CKMB (NOT AT ARMC)
CK, MB: 0.7 ng/mL (ref 0.3–4.0)
Total CK: 38 U/L (ref 7–177)

## 2010-08-01 LAB — TROPONIN I: Troponin I: <0.01 ng/mL (ref 0.00–0.06)

## 2010-08-21 ENCOUNTER — Emergency Department (HOSPITAL_COMMUNITY): Payer: Medicare Other

## 2010-08-21 ENCOUNTER — Emergency Department (HOSPITAL_COMMUNITY)
Admission: EM | Admit: 2010-08-21 | Discharge: 2010-08-22 | Disposition: A | Discharge status: Home / Self Care | Payer: Medicare Other | Attending: Emergency Medicine | Admitting: Emergency Medicine

## 2010-08-21 DIAGNOSIS — F3289 Other specified depressive episodes: Secondary | ICD-10-CM | POA: Insufficient documentation

## 2010-08-21 DIAGNOSIS — D649 Anemia, unspecified: Secondary | ICD-10-CM | POA: Insufficient documentation

## 2010-08-21 DIAGNOSIS — I10 Essential (primary) hypertension: Secondary | ICD-10-CM | POA: Insufficient documentation

## 2010-08-21 DIAGNOSIS — K219 Gastro-esophageal reflux disease without esophagitis: Secondary | ICD-10-CM | POA: Insufficient documentation

## 2010-08-21 DIAGNOSIS — R079 Chest pain, unspecified: Secondary | ICD-10-CM | POA: Insufficient documentation

## 2010-08-21 DIAGNOSIS — F329 Major depressive disorder, single episode, unspecified: Secondary | ICD-10-CM | POA: Insufficient documentation

## 2010-08-21 DIAGNOSIS — Z79899 Other long term (current) drug therapy: Secondary | ICD-10-CM | POA: Insufficient documentation

## 2010-08-21 DIAGNOSIS — E119 Type 2 diabetes mellitus without complications: Secondary | ICD-10-CM | POA: Insufficient documentation

## 2010-08-21 DIAGNOSIS — E785 Hyperlipidemia, unspecified: Secondary | ICD-10-CM | POA: Insufficient documentation

## 2010-08-21 LAB — CBC
Platelets: 237 10*3/uL (ref 150–400)
RBC: 3.64 MIL/uL — ABNORMAL LOW (ref 3.87–5.11)
WBC: 4.3 10*3/uL (ref 4.0–10.5)

## 2010-08-22 LAB — POCT CARDIAC MARKERS
CKMB, poc: <1 ng/mL — ABNORMAL LOW (ref 1.0–8.0)
Troponin i, poc: <0.05 ng/mL (ref 0.00–0.09)

## 2010-08-22 LAB — BASIC METABOLIC PANEL
BUN: 11 mg/dL (ref 6–23)
Creatinine, Ser: 0.71 mg/dL (ref 0.4–1.2)
GFR calc non Af Amer: >60 mL/min (ref >60)

## 2010-08-29 NOTE — Op Note (Signed)
NAMEJAIYANNA, [name]             ACCOUNT NO.:  [id number]   MEDICAL RECORD NO.:  [id number]          PATIENT TYPE:  AMB   LOCATION:  DAY                           FACILITY:  [facility]   PHYSICIAN:  R. Roetta Sessions, M.D. DATE OF BIRTH:  March 27, 1966   DATE OF PROCEDURE:  09/16/2008  DATE OF DISCHARGE:                               OPERATIVE REPORT   PROCEDURE:  Esophagogastroduodenoscopy with biopsy.   INDICATIONS FOR PROCEDURE:  A 45 year old [place] American lady with  ongoing alcohol abuse with a limited history of nausea and vomiting,  intermittent black tarry stools, apparently some degree of anemia,  hemoccult-positive in the setting of aspirin, ibuprofen, Anacin use.  I  saw her in the office yesterday.  She really has not had any abdominal  pain whatsoever.  She has been to the emergency department a couple of  times.  She has seen Dr. Lodema Hong and finally came to see me.  Labs  yesterday demonstrated white count 10.8, H and H 12.8 and 36.6, MCV  87.2, platelet count 185,000.  Electrolytes looked good except for a  potassium of 3.2 which was rechecked and was found to be 3.6 today.  LFTs were normal.  Lipase normal.  Pregnancy test negative.  EGD is now  being done because of her polypharmacy abuse and alcohol abuse.  She is  being done in the OR under propofol.  The risks, benefits, and  limitations had been discussed with the patient yesterday and again  today at the bedside.  Please see the documentation in the medical  record.   PROCEDURE NOTE:  Oxygen saturation, blood pressure, pulse, and  respiration were monitored throughout the entire procedure.  Propofol  sedation per Dr. Marcos Eke and associates.  Cetacaine spray for topical  pharyngeal anesthesia.   FINDINGS:  Examination of the tubular esophagus revealed entirely normal  mucosa.  The EG junction was easily traversed.   Stomach:  Gastric cavity was emptied and insufflated well with air.  Thorough examination of the  gastric mucosa including retroflexion of the  proximal stomach-esophagogastric junction demonstrated some excoriations  in the cardia and a very small hiatal hernia, excoriations most likely  related trauma (heaving).  The patient's linear antral erosions and  submucosal petechiae (? early GAVE-gastric antral vascular ectasia  versus erosive gastritis).  There was no ulcer, no infiltrating process.  Pylorus patent and easily traversed.  Examination of the bulb and second  portion revealed no abnormalities.   Therapeutic/diagnostic maneuvers:  Some antral biopsies were taken for  histologic study.   The patient tolerated the procedure well and was reactive after  endoscopy.   IMPRESSION:  Normal esophagus, small hiatal hernia, excoriations  involving the cardia and mucosa consistent with trauma, antral erosions  of linear petechiae ? gastritis versus early gastric antral vascular  ectasia.   Otherwise, there was no stigmata of chronic liver disease/portal  hypertension on today's exam.   DISCUSSION:  [name] may have an element of diabetic gastroparesis  intermittently unmasked with polypharmacy and alcohol abuse.  This could  also be nonsteroidal anti-inflammatory drug-induced insult in part.  RECOMMENDATIONS:  1. Stop all aspirin (that is, stop aspirin and Anacin).  Also stop      ibuprofen.  2. Continue Prilosec 20 mg orally daily.  3. Add Carafate 1 g suspension b.i.d. for the next 2 weeks.  4. Stop drinking alcohol.  5. Office visit with [name] in 1 month.  At this time, I do not see a need      for a colonoscopy or further diagnostic studies.  But, plans could      change depending on clinical course in the near future.      Jonathon Bellows, M.D.  Electronically Signed     RMR/MEDQ  D:  09/16/2008  T:  09/16/2008  Job:  [id number]
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
## 2010-08-29 NOTE — Assessment & Plan Note (Signed)
[facility]                       [place] CARDIOLOGY OFFICE NOTE   ECHO, PROPP                    MRN:          [id number]  DATE:06/03/2008                            DOB:          Aug 22, 1965    CARDIOLOGIST:  Gerrit Friends. Dietrich Pates, MD, [facility]   PRIMARY CARE PHYSICIAN:  Milus Mallick. Lodema Hong, MD   REASON FOR VISIT:  Chest pain.   HISTORY OF PRESENT ILLNESS:  [name] is a 45 year old female patient  who has diabetes, hyperlipidemia, and hypertension who continues to  smoke 2 packs of cigarettes per day.  She had a heart catheterization in  June 2007 that demonstrated 20% ostial stenosis in the LAD.  This was  not felt to be significant at that time.  She recently presented to the  [facility] Emergency Room with chest discomfort.  She tells me  this was going on pretty much the entire day.  It was not related to  exertion.  She eventually developed jaw pain and was afraid she was  having a heart attack and went to the emergency room.  She ruled out by  enzymes.  Her CBC was normal.  Her CMET demonstrated a low potassium,  glucose was 185, creatinine 0.79.  I do not see that a chest x-ray was  done.  She did have a chest x-ray on May 20, 2008, that demonstrated  bronchial thickening with the right lower lobe ill-defined opacity  concerning for developing infiltrative pneumonia.  At that time, she was  treated with antibiotics for pneumonia as an outpatient.  Her chest  discomfort last week was not associated with shortness of breath.  She  denied any arm pain or diaphoresis, but did have some nausea.  She notes  that it was improved by taking aspirin and nitroglycerin in the  emergency room.  She denies any syncope.  She has slept on 2-3 pillows  chronically for quite some time now without any significant change.  She  denies paroxysmal nocturnal dyspnea or edema.  She describes shortness  of breath with exertion that she  thinks is getting worse.  She describes  NYHA class II to class IIB symptoms.   MEDICATIONS:  Aspirin 81 mg daily, Depakote 500 mg at bedtime, Depakote  250 mg in the morning, Lexapro 20 mg daily,  Ferrous sulfate b.i.d., Metformin 1 g b.i.d., Atenolol 25 mg daily,  Geodon 80 mg 2 tablets in the morning, 40 mg at bedtime,  Lovastatin 40 mg daily, Nitroglycerin p.r.n.   ALLERGIES:  PENICILLIN.   SOCIAL HISTORY:  She continues to smoke 2 packs of cigarettes per day.   FAMILY HISTORY:  Insignificant for premature CAD.   REVIEW OF SYSTEMS:  Please see HPI.  Denies fevers, chills, melena,  hematochezia, hematuria, or dysuria.  Rest of the review of systems are  negative.   PHYSICAL EXAMINATION:  GENERAL:  She is a well-nourished, well-developed  female in no acute distress.  VITAL SIGNS:  Blood pressure is 128/90, pulse 81, weight 204 pounds.  HEENT:  Normal.  NECK:  Without JVD.  LYMPH:  Without lymphadenopathy.  CARDIAC:  Normal S1 and S2.  Regular rate and rhythm.  LUNGS:  Clear to auscultation bilaterally.  Soft and nontender.  EXTREMITIES:  Without edema.  NEUROLOGIC:  She is alert and oriented x3.  Cranial nerves II through  XII grossly intact.  SKIN:  Warm and dry.   Electrocardiogram reveals sinus rhythm with a heart rate of 81, normal  axis, poor R-wave progression, nonspecific ST-T wave changes.   ASSESSMENT/PLAN:  Chest pain in a 45 year old female with diabetes,  hypertension, and hyperlipidemia who had a 20% ostial stenosis in the  LAD in June 2007 at cardiac catheterization.  She smokes cigarettes and  has recently increased her smoking to 2 packs per day.  Her symptoms of  chest discomfort are somewhat atypical.  She apparently received Toradol  at Dr. Anthony Sar office recently.  This helped her symptoms somewhat.  She has not had a recurrence since that time.  However, she is at risk  for progressive CAD as she is a diabetic and continues to smoke.  She is  also  describing shortness of breath with exertion that seems to be  changing/new for her.  I have recommended proceeding with an  echocardiogram to assess her LV function as well as to assess her right  heart pressures.  She also tells me that she has always had a murmur,  but I do not hear this on exam today.  We can further assess her  valvular status with an echocardiogram as well.  She will also be set up  for a stress Myoview study to rule out the possibility of ischemia.  I  will bring her back in followup in 2 weeks to review the findings on her  stress test and make further recommendations, if any.      Tereso Newcomer, PA-C  Electronically Signed      Jonelle Sidle, MD  Electronically Signed   SW/MedQ  DD: 06/03/2008  DT: 06/04/2008  Job #: [id number]   cc:   Milus Mallick. Lodema Hong, M.D.

## 2010-08-29 NOTE — Assessment & Plan Note (Signed)
[facility]                       [place] CARDIOLOGY OFFICE NOTE   MONNICA, SALTSMAN                    MRN:          [id number]  DATE:07/08/2008                            DOB:          18-May-1965    CARDIOLOGIST:  Gerrit Friends. Dietrich Pates, MD, [facility]   PRIMARY CARE PHYSICIAN:  Milus Mallick. Lodema Hong, MD   REASON FOR VISIT:  One-month followup.   HISTORY OF PRESENT ILLNESS:  [name] is a 45 year old female patient  with a history of diabetes, hyperlipidemia, hypertension and tobacco  abuse, whom I recently saw on June 03, 2008, with chest pain.  Her  symptoms were somewhat atypical and I set her up for a stress testing  and an echocardiogram.  Her stress test was done on February 24.  She  exercised to 99% of her age-predicted maximal heart rate.  Her images  demonstrated normal myocardial perfusion with no evidence for ischemia  or infarction.  She did have minimal area of tracer uptake in the region  of the right breast.  Her echocardiogram demonstrated an EF of 60% and  no valvular abnormalities.  She returns to the office today for  followup.  Unfortunately, her step mother passed away recently.  She was  a patient in our office.  The details of her stepmother's death are  uncertain at this time.  She denies any further chest pain or shortness  breath.  She denies any syncope or near-syncope.  Denies any orthopnea,  PND, or pedal edema.   MEDICATIONS:  Aspirin 81 mg daily, Depakote 1 g nightly, Lexapro 20 mg  daily, Ferrous sulfate b.i.d., Metformin 1 g b.i.d., Atenolol 25 mg  daily,  Geodon 80 mg in the morning and 40 mg in the evening, Lovastatin 40 mg  daily, Zyban for smoking, Nitroglycerin p.r.n.   REVIEW OF SYSTEMS:  She does notice some pain in her right axilla that  radiates down her arm that has made somewhat worse with positioning  changes.   ALLERGIES:  PENICILLIN.   PHYSICAL EXAMINATION:  GENERAL:  She is a  well-nourished, well-developed  female in no acute distress.  VITAL SIGNS:  Blood pressure 130/80, pulse 76, and weight 203 pounds.  HEENT:  Normal.  NECK:  Without JVD.  CARDIAC:  Normal S1 and S2.  Regular rate and rhythm.  LUNGS:  Clear to auscultation bilaterally.  ABDOMEN:  Soft and nontender.  EXTREMITIES:  Without edema.  NEUROLOGIC:  She is alert and oriented x3.  Cranial nerves II-XII are  grossly intact.  LYMPH:  No adenopathy is appreciated in the right axilla.   ASSESSMENT AND PLAN:  1. Chest pain.  As noted above, the patient has significant risk      factors.  She had a recent stress Myoview study and an      echocardiogram with reassuring results.  She has had no further      symptoms.  She requires no further cardiac workup at this time.  2. Increased tracer uptake in the region of the right breast.  I have      discussed this with the patient.  She  needs an early followup with      her primary care physician, Dr. Lodema Hong.  We will send a copy of      the recent stress test to Dr. Lodema Hong.  At the least, she needs      followup breast exam.  If she has not had a recent mammogram, that      should be pursued as well.  I will leave any further workup to Dr.      Lodema Hong.   DISPOSITION:  The patient can be brought back in followup with Dr.  Dietrich Pates in 1 year or sooner p.r.n.      Tereso Newcomer, PA-C  Electronically Signed      Gerrit Friends. Dietrich Pates, MD, [facility]  Electronically Signed   SW/MedQ  DD: 07/08/2008  DT: 07/09/2008  Job #: [id number]   cc:   Milus Mallick. Lodema Hong, M.D.

## 2010-08-29 NOTE — H&P (Signed)
[name], [name]             ACCOUNT NO.:  [id number]   MEDICAL RECORD NO.:  [id number]          PATIENT TYPE:  EMS   LOCATION:  ED                            FACILITY:  [facility]   PHYSICIAN:  Dorris Singh, DO    DATE OF BIRTH:  [birth year]   DATE OF ADMISSION:  12/11/2008  DATE OF DISCHARGE:  LH                              HISTORY & PHYSICAL   CHIEF COMPLAINT:  Chest pain.   HISTORY OF PRESENT ILLNESS:  The patient is a 45 year old African female  who was seen on August 27th for the same complaint.  She ended up  checking herself out AMA due to the fact that she wanted to smoke and  nobody would let her smoke.  She returns again tonight having chest pain  again.  The patient also admits to an extensive history of alcohol use  in which she drinks something called four loco, several of those a day.  She was placed on medication by her primary care physician, who is Dr.  Lodema Hong, for alcohol withdrawal.  I have talked to her extensively about  AAA.  At this time she does not seem interested in it.  Also, she was  told she had gastric erosions in the past.  She stated that she did  drink in the past but then started to drink again since her oldest child  was going off to college and has felt depressed surrounding that.  I  talked to her extensively regarding her current chest pain status and I  explained to her that right now all of her enzymes are negative and this  was the case when she was evaluated on the 27th.  We were keeping her  for a rule out.  I talked extensively to the patient regarding her  current situation and counseled her on alcohol cessation as well.  Her  chest pain is described to be located in the left chest underneath her  breast.  She states it is an achy pain that does not seem to go away.  She also states that she has had dizziness and what looks to be black  stool, however, Hemoccult stool was done here and it was negative, and  she has had problems with  her legs just with walking.  Her last normal  period was 12/10/2008.   PAST MEDICAL HISTORY:  Diabetes, hypertension, acid reflux alcoholism,  angina, depression, GI bleed, hyperlipidemia and hypokalemia.   FAMILY HISTORY:  CAD and diabetes.  Her father died at age 57 of an MI.   SURGICAL HISTORY:  She has had a D and C and cardiac catheterization.   SOCIAL HISTORY:  No drug use.  However, she is a current smoker and a  heavy drinker.   ALLERGIES:  SHE HAS ALLERGY TO PENICILLIN.   MEDICATIONS:  Geodon 160 at bedtime, glipizide 5 mg once a day, Lomotil  2.5 as needed, sucralfate 1 gram four times a day, Campral 66 mg two  times a day, atenolol 25 mg once a day, divalproex 500 mg every evening,  divalproex 1000 mg at bedtime, nitroglycerin 0.4  sublingual as needed,  lovastatin 40 mg at bedtime, metformin 1000 mg b.i.d., ibuprofen 800 mg  b.i.d., ferrous sulfate 325 once a day, omeprazole 20 mg twice a day,  hydrochlorothiazide 25 mg once a day, loratadine 10 mg once a day,  trazodone 50 mg at bedtime, Klor-Con 10 mEq twice a day, Lexapro 20 mg  once a day, Reglan 10 mg as needed and promethazine as needed.   REVIEW OF SYSTEMS:  CONSTITUTIONAL:  Positive for dizziness and fatigue.  Negative for fever.  EYES:  Negative for eye pain.  ENT:  Negative for  ear pain.  CARDIOVASCULAR:  Positive for chest pain.  RESPIRATORY:  Positive for wheezing.  GASTROINTESTINAL:  Negative for nausea and  vomiting.  GU:  Negative for dysuria or frequency.  SKIN:  Negative for rash.  NEURO:  Positive for headache.  HEMATOLOGIC:  Negative  for anemia.   PHYSICAL EXAMINATION:  VITAL SIGNS:  Blood pressure 134/85, pulse rate  82, respirations 19, temperature 99.5.  GENERAL:  The patient is a 45 year old African American female who is in  no acute distress.  She is well-developed, well-nourished.  HEAD:  Normocephalic, atraumatic.  EYES:  PERRL, EOMI.  NECK:  Supple.  Full range of motion.  HEART:   Regular rate and rhythm.  No murmurs, rubs or gallops noted.  LUNGS:  Wheezing on expiration.  CHEST:  Nontender with palpation.  ABDOMEN:  Soft, nontender, nondistended.  Bowel sounds in all four  quadrants, no organomegaly noted.  GU:  External genitalia within normal limits.  EXTREMITIES:  Positive range of motion.  NEURO:  Alert and oriented x3.  Normal coordination, normal speech.  SKIN:  Warm and dry.   Her current EKGs from the 27th are identical and show normal sinus  rhythm, nonspecific T wave abnormalities, prolonged QT, an abnormal ECG.  Her urine has a few bacteria.  She has large hemoglobin.  She is on her  menses.  D-dimer is 0.22.  Alcohol level less than 5.  Sodium 137,  potassium 3.9, chloride 104, carbon dioxide 27, glucose 150, BUN 9 and  creatinine 0.87.  White count is 8, hemoglobin 12.2, hematocrit 35.7,  platelet count of 139.   ASSESSMENT:  1. Chest pain.  2. Alcoholism.  3. History of gastroesophageal reflux disease with gastric erosions.   PLAN:  Talked extensively with the patient regarding plan of care due to  the fact that she had been admitted for the same symptoms on the 27th  and left AMA because she could not smoke.  Explained to her that we  would not have Cardiology until Monday morning. She stated that one of  the reasons she left was that she was sitting there and did not feel  anybody was taking care of her and she wanted to smoke and was upset  when security came to find her. I asked her did she ask if she could get  a patch and she stated that nobody offered her that option.  Also, with  the patient's extensive drinking history there is some concern as to  whether or not she can stay here until she can be evaluated by  Cardiology.  She has been started on Campral which she takes three times  a day.  She states she has been doing that as well.  I talked  extensively to the patient about her activities today.  She said she had  been out in the  sun and drove her son to [facility] and felt like she  over did it.  She also states that due to her drinking she has not been  eating properly. After talking to the patient on the best plan of care,  it was determined that the best plan would be to allow her to go home  tonight and to avoid any alcohol.  I mentioned I would contact Dr.  Lodema Hong in the morning to see if she can get a followup appointment on  Monday so she can be followed up by Cardiology.  I also discussed with  the patient that if she does have any chest pain that is over and above  what she is currently experiencing, that she needs to go to the nearest  emergency room and/or [facility] due to the fact they will have a  cardiologist over the weekend.  The patient stated understanding of this  plan.  I discussed with her that if she felt like she could not go home  I  would keep her for observation until Monday.  She stated at this time  that she would prefer to go home.  We will go ahead and discharge her  with the following plan.      Dorris Singh, DO     CB/MEDQ  D:  12/12/2008  T:  12/12/2008  Job:  [id number]

## 2010-08-29 NOTE — Letter (Signed)
March 15, 2008    Milus Mallick. Lodema Hong, M.D.  [address]  [place], [place]  [address]   RE:  [name], [name]  MRN:  [id number]  /  DOB:  09-13-1965   Dear [name]:   [name] is seen in the office today at the request of [name],  a physician's assistant working with Dr. Jena [name], for palpitations.  Ms.  [name] reports these symptoms have resolved since she was referred for  reevaluation.  She has had no significant chest discomfort.  She has  been diagnosed with GERD and required esophageal dilatation for a  Schatzki ring.  This problem could account for many of her potentially  cardiac symptoms in the past.  Overall, she is doing generally well.  She has gained some weight.  Hyperlipidemia is controlled with  pharmacologic agents.  She has not had hypertension.  Unfortunately, she  continues to smoke cigarettes at the rate of 1 pack per day.   CURRENT MEDICATIONS:  Include,  1. Aspirin 81 mg daily.  2. Depakote 500 mg q.p.m. and 250 mg q.a.m.  3. Lexapro 20 mg daily.  4. Iron sulfate 1 b.i.d.  5. Metformin 1000 mg b.i.d.  6. Geodon 80 mg a.m. and 40 mg p.m.  7. Atenolol 25 mg daily.  8. Lovastatin 40 mg daily.   She reports missing a menstrual period recently.  This has occurred in  the past.  She is sexually active and using condoms for birth control.   PHYSICAL EXAMINATION:  GENERAL:  Pleasant woman in no acute distress.  VITAL SIGNS:  The weight is 212, 28 pounds more than in January.  Blood  pressure 110/80, heart rate 70 and regular, respirations 14.  NECK:  No jugular venous distention; no carotid bruits.  LUNGS:  Clear.  CARDIAC:  Normal.  First and second heart sounds; minimal systolic  murmur.  ABDOMEN:  Soft and nontender; no organomegaly.  EXTREMITIES:  No edema; normal distal pulses.   IMPRESSION:  [name] is experiencing no cardiopulmonary symptoms at  the present time.  She does not need additional testing.  Hyperlipidemia  is  under excellent management.  She has tried to stop smoking with  Chantix, but experiences insomnia.  I suggested that she make another  attempt with nicotine patches.  I also recommended weight loss.  Her  weight is quite variable, but she would obviously be better off close to  180 than 220.  We discussed her missed menstrual period and the need for  pregnancy test if her next menses do not come on schedule.  I will see  this nice woman again at any time you deem appropriate.    Sincerely,      Gerrit Friends. Dietrich Pates, MD, [facility]  Electronically Signed    RMR/MedQ  DD: 03/15/2008  DT: 03/16/2008  Job #: [id number]   CC:    R. Roetta Sessions, M.D.

## 2010-08-29 NOTE — Assessment & Plan Note (Signed)
[name], [name]              CHART#:  [id number]   DATE:  11/03/2007                       DOB:  [birth year]   CHIEF COMPLAINT:  GERD.   PROBLEM LIST:  1. Chronic complicated gastroesophageal reflux disease with history of      Schatzki ring, status post dilatation with a 56-French Elease Hashimoto      dilator, September 29, 2007 by Dr. Jena Gauss.  2. History of alcohol abuse, quit drinking May 2009.  3. Colon hemorrhoids found on colonoscopy, February 05, 2006, by Dr.      Jena Gauss.  4. Hypertension.  5. Diabetes mellitus.  6. Depression.   SUBJECTIVE:  The patient is a 45 year old African American female.  She  quit drinking alcohol about 2 months ago.  She has complicated GERD.  She is having problems despite daily Aciphex 20 mg.  She notes that most  of her symptoms is regurgitation or water brash and is usually worse  when she lies supine.  She has occasional bilateral lower quadrant cramp-  like abdominal pain.  She has noted some palpitations and has an  appointment to see her doctor at [facility].  She does not  recall his name.  Her weight has remained stable.  Her appetite is good.  She denies any dysphagia at this time.   CURRENT MEDICATIONS:  See the list from November 03, 2007.   ALLERGIES:  PENICILLIN.   OBJECTIVE:  VITAL SIGNS:  Weight 240 pounds, height 64 inches, temp  98.3, blood pressure 120/80, and pulse 84.  GENERAL:  She is a well-developed, well-nourished, African American  female in no acute distress.  HEENT:  Sclerae clear, nonicteric.  Conjunctivae pink.  Oropharynx pink  and moist.  Dorsum of tongue with white covering, no discrete lesions  and entire tongue appears discolored.  Posterior pharynx appears clear.  CHEST:  Heart is regular rate and rhythm, normal S1 and S2.  ABDOMEN:  Positive bowel sounds x4.  No bruits auscultated.  Soft,  nontender, and nondistended without palpable mass or splenomegaly.  No  rebound tenderness or guarding.  EXTREMITIES:   Without clubbing or edema.   ASSESSMENT:  The patient is a 45 year old African American female with  oral candidiasis.   She has refractory gastroesophageal reflux disease with a history of  Schatzki ring and main concern is regurgitation up her nose.  I suspect  this could be due to LPR, however, if she does not respond to PPI, we  would be concerned about other etiology and would defer to ear, nose,  and throat evaluation.   PLAN:  1. Begin omeprazole 20 mg b.i.d. with 1 month's trial.  2. Nystatin 100,000 units per mL, 5 mL p.o. q.i.d. for 2 weeks, no      refills.  3. If symptoms persist after 59-month trial, we would refer to ENT,      then office visit in 6 months or sooner if needed.  She is to call her cardiologist today regarding her palpitations for  further evaluation.       Lorenza Burton, N.P.  Electronically Signed     R. Roetta Sessions, M.D.  Electronically Signed    KJ/MEDQ  D:  11/03/2007  T:  11/04/2007  Job:  [id number]   cc:   Milus Mallick. Lodema Hong, M.D.  [facility]

## 2010-08-29 NOTE — Op Note (Signed)
NAME:  [name], [name]             ACCOUNT NO.:  [id number]   MEDICAL RECORD NO.:  [id number]          PATIENT TYPE:  AMB   LOCATION:  DAY                           FACILITY:  [facility]   PHYSICIAN:  R. Roetta Sessions, M.D. DATE OF BIRTH:  04/02/1966   DATE OF PROCEDURE:  09/29/2007  DATE OF DISCHARGE:                               OPERATIVE REPORT   INDICATIONS FOR PROCEDURE:  This is a 45 year old lady with a history of  alcohol abuse who presents with refractory reflux symptoms and  esophageal dysphagia to solids.  EGD with potential for esophageal  dilation to be performed.  Risks, benefits, alternatives, and  limitations of this approach have been reviewed with [name]  previously and again today at the bedside.  All questions were answered.  All parties agreeable.  Please see documentation of medical records.  The patient had barium swallow esophagram which demonstrated a smooth  taper and distal esophageal obstruction at 30-mm pill.   PROCEDURE NOTE:  O2 saturation, blood pressure, pulse, and respirations  were monitored throughout the entire procedure.  Conscious sedation MAC  per Dr. Marcos Eke' associates.  Cetacaine spray for topical pharyngeal  anesthesia.   INSTRUMENTATION:  Pentax video chip system.   FINDINGS:  EGD examination of the tubular esophagus revealed a  Schatzki's ring, otherwise esophageal mucosa appeared normal.  EG  junction easily traversed.  Stomach:  Gas cavity was a empty,  insufflated well with air.  The examination of the gastric mucosa  including retroflex view of the proximal stomach, esophagogastric  junction demonstrated a small hiatal hernia and fairly localized antral  erosions and no ulcer infiltrating process.  Pylorus patent, easily  traversed.  Examination of the bulb and second portion revealed no  abnormalities.   THERAPEUTIC/DIAGNOSTIC MANEUVERS PERFORMED:  The scope was withdrawn.  A  56-French Maloney dilator was passed full  insertion.  A look back  revealed the ring had ruptured nicely without apparent complication.  Subsequently, the antral mucosa was biopsied for histologic study.  The  patient tolerated the procedure well and has reactive endoscopy.   IMPRESSION:  Schatzki's ring, otherwise normal esophagus status post  dilation described as above, small hiatal hernia, antral erosions.  Remainder of the gastric mucosa appeared unremarkable.  Normal D1 and  D2, status post gastric biopsy.   RECOMMENDATIONS:  1. Stop Omeprazole, begin AcipHex 20 mg orally daily.  2. Antireflux literature provided to Ms. Electa Sniff.  3. Follow-up on path.  4. Further recommendations to follow.      Jonathon Bellows, M.D.  Electronically Signed     RMR/MEDQ  D:  09/29/2007  T:  09/30/2007  Job:  [id number]   cc:   Salvadore Farber, MD  [address]  [address]  [place]  [place] [address]   Milus Mallick. Lodema Hong, M.D.  Fax: [phone]

## 2010-08-29 NOTE — Letter (Signed)
April 28, 2007    Milus Mallick. Lodema Hong, M.D.  [address], [address]  [place], [place] [address]   RE:  IASIA, FORCIER  MRN:  [id number]  /  DOB:  03-27-1966   Dear Claris Che:   Ms. Ridgeway is seen in the office today after a recent visit to the  emergency department.  As you know, she was previously a patient of Dr.  Marchelle Folks, followed for rare episodes of chest discomfort.  She underwent  a stress test in 2005 that was negative and a cardiac catheterization in  2007 that revealed insignificant LAD disease with a 20% or so lesion.  She did well until approximately one week ago when she noted the sudden  onset of right hand numbness progressing to right arm pain and then some  associated chest discomfort.  The entire episode lasted approximately 10  minutes.  In the emergency department, her exam, cardiac markers and  EKGs were unremarkable.  She unfortunately continues to smoke  cigarettes.  She has followed a much better diet with approximately a 50-  pound weight loss.  She has mild diabetes treated with an oral agent.  She has hyperlipidemia - I do not have recent cholesterol values; lipid  profile in June of 2007 was excellent.   CURRENT CARDIAC MEDICATIONS:  Include:  1. Aspirin 81 mg daily.  2. Atenolol 25 mg daily.  3. Lovastatin 20 mg daily.  4. Chantix 1 mg b.i.d.   On exam, pleasant woman in no acute distress.  The weight is 184, 12  pounds less than in June 2007.  Blood pressure 100/60, heart rate 70 and  regular, respirations 18.  NECK:  No jugular venous distention; no carotid bruits.  LUNGS:  Clear.  CARDIAC:  Normal first and second heart sounds; fourth heart sound  present.  ABDOMEN:  Soft and nontender; no bruits; no organomegaly.  EXTREMITIES:  No edema.   IMPRESSION:  [name] has rare episodes of chest discomfort that  probably do not reflect coronary disease.  I have given her sublingual  nitroglycerin so she will have some modality of treatment  available.  I  do not believe that stress testing is warranted, since the likelihood of  developing significant coronary disease over the past year is extremely  small.  We will support her in her efforts to discontinue cigarette  smoking and check a lipid profile.  I will plan a return office visit in  6 months.    Sincerely,      Gerrit Friends. Dietrich Pates, MD, [facility]  Electronically Signed    RMR/MedQ  DD: 04/28/2007  DT: 04/29/2007  Job #: [id number]

## 2010-09-01 NOTE — Discharge Summary (Signed)
[name], [name]             ACCOUNT NO.:  [id number]   MEDICAL RECORD NO.:  [id number]          PATIENT TYPE:  INP   LOCATION:  A217                          FACILITY:  [facility]   PHYSICIAN:  Osvaldo Shipper, MD     DATE OF BIRTH:  29-May-1965   DATE OF ADMISSION:  09/18/2005  DATE OF DISCHARGE:  06/06/2007LH                                 DISCHARGE SUMMARY   Please note, the patient is being transferred to [facility] for further evaluation.   The patient's primary doctor is based out of [facility].   DISCHARGE DIAGNOSES:  1.  Chest pain requiring cardiac catheterization.  2.  History of type 2 diabetes.  3.  History of hypertension.  4.  Obesity.  5.  Smoking abuse.  6.  History of depression.   Please see H&P dictated at the time of admission for details regarding the  patient's presenting illness.   BRIEF HOSPITAL COURSE:  Briefly, this is a 45 year old African-American  female who presented to the hospital with history of left-sided chest pain  on and off for 1 day.  Because of her risk factors, the patient was admitted  to the hospital.  She was being ruled out for acute coronary syndrome.  Her  EKG showed poor R-wave progression, otherwise was quite unremarkable.  Cardiology was consulted to consider an inpatient stress test on this  patient because of her risk factors.  Dr. Dorethea Clan from [facility]  reviewed the patient's case and decided that the best course of action will  be for the patient to undergo a cardiac catheterization.  In view of the  above, the patient was subsequently transferred via [facility] to [facility].  The patient was continued on aspirin, beta blockers, and was also  started on a statin.   Further disposition will be determined at [facility]      Osvaldo Shipper, MD  Electronically Signed     GK/MEDQ  D:  09/19/2005  T:  09/19/2005  Job:  [id number]   cc:   Vida Roller, M.D.  Fax:  [phone]

## 2010-09-01 NOTE — Consult Note (Signed)
[name], [name]             ACCOUNT NO.:  [id number]   MEDICAL RECORD NO.:  [id number]          PATIENT TYPE:  INP   LOCATION:  3707                         FACILITY:  [facility]   PHYSICIAN:  Vida Roller, M.D.   DATE OF BIRTH:  May 02, 1965   DATE OF CONSULTATION:  09/19/2005  DATE OF DISCHARGE:                                   CONSULTATION   CONSULTING PHYSICIAN:  Vida Roller, M.D.   HISTORY OF PRESENT ILLNESS:  Mrs. Simon is a 45 year old woman with no  known coronary disease who has recurrent chest discomfort.  She was seen  back in July 2005, with chest pain, had a Cardiolite which was negative,  normal LV systolic function.  She has diabetes, a history of hyperlipidemia,  ongoing tobacco abuse, and a family history of coronary disease.  She states  there discomfort has been waxing and waning for less 24 hours.  It started  with some physical exertion.  It gets worse when she smokes a cigarette,  associated with shortness of breath and diaphoresis.  It does not radiate  anywhere.  The pain has recently subsided after a prolonged period.  She  denies any PND or orthopnea.  No lower extremity edema.   PAST MEDICAL HISTORY:  Significant for:  1.  Diabetes.  2.  Hyperlipidemia.  3.  She also has a history of a seizure disorder.  4.  And, she has a history of schizophrenia which is reasonably well-      controlled on medications.   She is on the following medications as an outpatient:  1.  Atenolol 25 once a day.  2.  Depakote 250 in the morning, 500 in the evening.  3.  Geodon 280 once a day in the evening.  4.  Metformin 500 b.i.d.  5.  Lexapro, she thinks she takes 10 mg once a day.  6.  Iron 125 twice a day.  7.  Aspirin 81 mg a day.   She lives in [place] with her son.  she is a Lawyer.  She is single.  She  has two children.  She smokes about a pack a day and has for the last 17 to  18 years.  Denies any alcohol or drug abuse.   FAMILY HISTORY:  Her mother  is still alive at age 21.  Father died at age 43  of a stroke.  She has three brothers and five sisters.  One of her sisters,  who is a little older than her, had a heart attack at a relatively young  age.  She is not sure of the age.   REVIEW OF SYSTEMS:  Is generally negative otherwise.   PHYSICAL EXAMINATION:  GENERAL:  She is a well-developed, mildly obese,  American female in no apparent distress who is alert and oriented x4.  VITAL SIGNS:  Pulse is 65, respirations are 20, blood pressure 135/97.  She  weighs 200 pounds.  HEENT:  Unremarkable.  NECK:  Supple.  There is no jugular venous distension or carotid bruits.  CARDIOVASCULAR:  Regular.  Point maximal impulse is not displaced.  First  and second heart sounds are normal.  There is a fourth heart sound, but no  murmur.  LUNGS:  Clear to auscultation.  SKIN:  Without rashes.  GU/BREASTS/RECTAL:  Exams were deferred.  ABDOMEN:  Soft, nontender.  LOWER EXTREMITIES:  Without clubbing, cyanosis, or edema.  Pulses are 1+.  No bruits in her femoral area.  MUSCULOSKELETAL:  Grossly nonfocal.  NEUROLOGIC:  Grossly nonfocal.   Chest x-ray shows right basilar atelectasis with scar but otherwise  unremarkable.  Electrocardiogram shows sinus rhythm at a rate of 62, normal  intervals, normal axes.  No Q-waves.  Nonspecific ST-T wave changes with  poor R-wave progression, essentially unchanged from previous.   LABORATORY:  White blood cell count 7.2, H&H of 12 and 37, platelet count  203.  Sodium 136, potassium 3.4, chloride 107, bicarb 24, BUN 8, creatinine  0.8, blood sugar 129.  Cardiac enzymes x2 were negative.  Urine drug screen  is negative.   1.  So, this is a lady with chest discomfort.  The history is somewhat      concerning.  She has got multiple cardiac risk factors including ongoing      tobacco abuse and diabetes.  I think we probably need to treat this is      an acute coronary syndrome.  She is giving a reasonably  convincing      history.  2.  Hypertension which is poorly controlled.  3.  Diabetes which is poorly controlled.  4.  Questionable lipid status with a history of hyperlipidemia.  We will      need to check her lipids.  5.  Ongoing tobacco abuse.  She is currently trying to quit.  She is on a      nicotine patch.   RECOMMENDATIONS:  1.  Transfer her to [facility].  2.  That we give her anticoagulation with Lovenox.  3.  Continue her aspirin.  4.  She need to have her enzymes cycled.  5.  She will need a heart catheterization to evaluate for the significance      of her coronary artery disease and appropriate disposition upon the      results of heart catheterization.      Vida Roller, M.D.  Electronically Signed     JH/MEDQ  D:  09/19/2005  T:  09/19/2005  Job:  [id number]   cc:   Milus Mallick. Lodema Hong, M.D.  Fax: [phone]   Hanley Hays. Josefine Class, M.D.  Fax: [phone]

## 2010-09-01 NOTE — Discharge Summary (Signed)
NAMEEVELEAN, BIGLER             ACCOUNT NO.:  [id number]   MEDICAL RECORD NO.:  [id number]          PATIENT TYPE:  INP   LOCATION:  3707                         FACILITY:  [facility]   PHYSICIAN:  Salvadore Farber, M.D. LHCDATE OF BIRTH:  Aug 23, 1965   DATE OF ADMISSION:  09/19/2005  DATE OF DISCHARGE:  09/20/2005                           DISCHARGE SUMMARY - REFERRING   HISTORY OF PRESENT ILLNESS:  [name] is a 45 year old female who  presents with chest discomfort that has been waxing and waning, but also  appears to be exacerbated by exertion associated with shortness of breath  and diaphoresis.  Her history is notable for a negative stress Cardiolite in  July of 2005 which showed EF of 66% and no ischemia.  She has a history of  diabetes, hyperlipidemia, and tobacco use.   LABORATORY DATA:  Admission weight is 197.1.  H&H on June 7 is 12.0 and  36.0, normal indices, platelets 173, WBC 6.9.  PTT 32, PT 13.3.  Sodium 137,  potassium 4.6, BUN 5, creatinine 0.7.  Normal LFT's.  Protein and albumin  were slightly low at 5.7 and 3.3, respectively.  Including [facility], five CK-MB's, relative indices, and troponins were  negative for myocardial infarction.  Fasting lipid showed a total  cholesterol of 119, triglycerides 51, HDL 32, LDL 77.  Stools were heme  negative.  At [facility] urine drug screen was also  negative.   Chest x-ray at [facility] showed some right basilar  atelectasis and scarring.   EKG showed normal sinus rhythm, nonspecific ST T wave changes.   HOSPITAL COURSE:  Ms. Meharg was initially seen at [facility] and she was transferred to [facility] for further  evaluation.  She was placed on her home medications as well as Lovenox.  Case management became involved to assist with discharge needs.   On September 19, 2005, cardiac catheterization was performed.  This showed an EF  of 65%.   She had less than 20% proximal LAD lesion by IVUS.  Dr. Samule Ohm felt  that this was not severe enough to cause her cesarean delivery.  Given her  risk factors, statin was added to her medical regimen and she was asked to  continue aspirin.  By September 20, 2005, catheterization site was intact and it  was felt that she could be discharged home.   DISCHARGE DIAGNOSES:  1.  Noncardiac chest discomfort.  2.  Tobacco use.  3.  Hyperlipidemia.  4.  History of diabetes.   PROCEDURE:  Cardiac catheterization on September 19, 2005.   DISPOSITION:  She is asked to discharged home and asked to maintain a low  salt, fat, cholesterol, ADA diet.  Activity and wound care is per  supplemental sheet.  She was asked to bring all medications to all follow-up  appointments.  Her only new medication includes Lipitor 40 mg q.h.s. and  aspirin 81 mg daily.  She was asked to continue her iron twice a day,  Lexapro unknown dosage daily, resume her Metformin 500 mg b.i.d. on  September 22, 2005, Geodon 280 mg q.h.s., Atenolol 25 mg daily, and Depakote as  previously.  She was asked to call Dr. Lodema Hong to arrange 1-2 week follow-up  appointment.  She will see Dr. Virl Son. for a groin check on September 28, 2005, at 2:30.  She was advised no smoking or tobacco products.  She will need blood work in  approximately 6 weeks by her primary care physician since Lipitor was  initiated.  I had given her permission to return to work on September 24, 2005.   DISCHARGE TIME:  Less than 30 minutes.      Joellyn Rued, P.A. [facility]      Salvadore Farber, M.D. [facility]  Electronically Signed    EW/MEDQ  D:  09/20/2005  T:  09/20/2005  Job:  [id number]   cc:   Milus Mallick. Lodema Hong, M.D.  Fax: [phone]   Vida Roller, M.D.  Fax: [phone]

## 2010-09-01 NOTE — Op Note (Signed)
[name], [name]             ACCOUNT NO.:  [id number]   MEDICAL RECORD NO.:  [id number]          PATIENT TYPE:  AMB   LOCATION:  DAY                           FACILITY:  [facility]   PHYSICIAN:  R. Roetta Sessions, M.D. DATE OF BIRTH:  01-17-1966   DATE OF PROCEDURE:  02/05/2006  DATE OF DISCHARGE:                                 OPERATIVE REPORT   PROCEDURE PERFORMED:  Diagnostic colonoscopy.   INDICATIONS FOR PROCEDURE:  The patient is a 45 year old lady with  intermittent hematochezia.  She has had a several month history, colonoscopy  now being done. This approach has been discussed with the patient at length.  Potential risks, benefits and alternatives have been reviewed, questions  have been answered, she is agreeable.  Please see documentation in the  medical records.   PROCEDURE NOTE:  Oxygen saturations, blood pressure, pulse and respirations  were monitored throughout the entire procedure.   CONSCIOUS SEDATION:  Versed 4 mg IV, Demerol 75 mg IV in divided doses.   INSTRUMENT USED:  Olympus video chip system.   FINDINGS:  Digital exam revealed no abnormalities.   ENDOSCOPIC FINDINGS:  Prep was adequate.   Rectum:  Examination of the rectal mucosa including retroflex view of the  anal verge revealed only internal hemorrhoids.  Colon:  Colonic mucosa was surveyed from the rectosigmoid junction to the  left, transverse and right colon to the area of the appendiceal orifice and  ileocecal valve and cecum.  These structures were well seen and photographed  for the record.  From this level, the scope was slowly and cautiously  withdrawn.  All previously mentioned mucosal surfaces were again seen.  The  colonic mucosa appeared normal.  The patient tolerated the procedure well,  was reacted in endoscopy.   IMPRESSION:  Internal hemorrhoids, otherwise normal rectum and colon.  I  suspect the patient has had hematochezia secondary to hemorrhoids.   RECOMMENDATIONS:  1.  Hemorrhoid literature provided to [name].  2. A 10-day course of Anusol HC suppositories one per rectum at bedtime.      [name] is to let me know if the hematochezia does not resolve.      Jonathon Bellows, M.D.  Electronically Signed     RMR/MEDQ  D:  02/05/2006  T:  02/06/2006  Job:  [id number]   cc:   Milus Mallick. Lodema Hong, M.D.  Fax: [phone]

## 2010-09-01 NOTE — Cardiovascular Report (Signed)
[name], [name]             ACCOUNT NO.:  [id number]   MEDICAL RECORD NO.:  [id number]          PATIENT TYPE:  INP   LOCATION:  3707                         FACILITY:  [facility]   PHYSICIAN:  Salvadore Farber, M.D. LHCDATE OF BIRTH:  1965-06-04   DATE OF PROCEDURE:  09/19/2005  DATE OF DISCHARGE:                              CARDIAC CATHETERIZATION   PROCEDURE:  Left heart catheterization, left ventriculography, coronary  angiography, intravascular ultrasound of the left anterior descending,  StarClose closure of the right common femoral arteriotomy site.   INDICATIONS:  Ms. Mcarthy is a 45 year old woman with hypertension, diabetes  mellitus, and ongoing tobacco abuse who presents with an episode of chest  discomfort last evening lasting approximately 30 minutes.  She was admitted  to hospital and ruled out for myocardial infarction by serial enzymes and  electrocardiograms.  She had had a similar episodes two years ago with a  normal stress test.  Given her multiple risk factors Dr. Dorethea Clan recommended  cardiac catheterization for definitive exclusion of coronary disease as the  etiology of her chest discomfort.   PROCEDURAL TECHNIQUE:  Informed consent was obtained.  Under 1% lidocaine  local anesthesia a 5-French sheath was placed in the right common femoral  artery using the modified Seldinger technique.  Diagnostic angiography and  ventriculography were performed using JL4, JR4, and pigtail catheters.   These images demonstrated a haziness at the ostium of the LAD concerning for  a significant stenosis.  To further assess this we decided to proceed to  intravascular ultrasound.  To that end we initiated anticoagulation with  heparin to achieve and maintain an ACT of greater than 250 seconds.  The  sheath was up sized over a wire to 6-French.  A 6-French JL4 guiding  catheter was advanced over a wire and engaged in the ostium of the left  main.  A Prowater wire was advanced  in the mid LAD without difficulty.  Intravascular ultrasound was then performed.  This demonstrated mild plaque,  but no significant stenosis at the ostium of the left anterior descending  artery.  We injected contrast while imaging at the ostium to confirm that  what we interpreted to be lumen was in fact lumen and not thrombus.  There  was clearly flow throughout the entirety of the vessel excluding thrombus.  The arteriotomy was then closed using a StarClose device.  Complete  hemostasis was obtained.  The patient was then transferred to the holding  room in stable condition.   COMPLICATIONS:  None.   FINDINGS:  1.  LV 174/12/18.  EF 65% without regional wall motion abnormality.  2.  No aortic stenosis or mitral regurgitation.  3.  Left main:  Angiographically normal.  4.  LAD:  Moderate sized vessel giving rise to no significant diagonals.      There was a hazy lesion at the ostium.  After assessment with      intravascular ultrasound it is clear that this lesion is no more than      20% and thus could not account for her chest discomfort.  5.  Ramus intermedius:  Large branching vessel which is angiographically      normal.  6.  Circumflex:  Moderate sized vessel giving rise to a single posterior      left ventricular branch.  It is angiographically normal.  7.  RCA:  Large, dominant vessel.  It is angiographically normal.   IMPRESSION AND PLAN:  There is a mild stenosis at the ostium of the left  anterior descending artery.  This lesion is not severe enough to account for  her chest discomfort.  However, she does have atherosclerotic coronary  disease at a very young age.  Will continue aspirin and initiate statin.  Discussed with Dr. Lodema Hong with whom we will arrange follow-up.      Salvadore Farber, M.D. [facility]  Electronically Signed     WED/MEDQ  D:  09/19/2005  T:  09/20/2005  Job:  [id number]   cc:   Milus Mallick. Lodema Hong, M.D.  Fax: [phone]   Vida Roller, M.D.   Fax: [phone]

## 2010-09-01 NOTE — Procedures (Signed)
   NAME:  [name], [name]                       ACCOUNT NO.:  [id number]   MEDICAL RECORD NO.:  [id number]                   PATIENT TYPE:  EMS   LOCATION:  ED                                   FACILITY:  [facility]   PHYSICIAN:  Edward L. Juanetta Gosling, M.D.             DATE OF BIRTH:  Nov 14, 1965   DATE OF PROCEDURE:  01/20/2002  DATE OF DISCHARGE:  01/20/2002                                EKG INTERPRETATION   TIME/DATE:  2101, January 20, 2002.   DESCRIPTION OF PROCEDURE:  The rhythm is sinus rhythm with a rate in the  70s.  There are diffuse nonspecific T-wave abnormalities.   IMPRESSION:  Minimally abnormal electrocardiogram.                                                Edward L. Juanetta Gosling, M.D.    ELH/MEDQ  D:  01/21/2002  T:  01/22/2002  Job:  [id number]

## 2010-09-01 NOTE — H&P (Signed)
NAMEKENNETH, LAX             ACCOUNT NO.:  [id number]   MEDICAL RECORD NO.:  [id number]          PATIENT TYPE:  AMB   LOCATION:  DAY                           FACILITY:  [facility]   PHYSICIAN:  R. Roetta Sessions, M.D. DATE OF BIRTH:  12-07-1965   DATE OF ADMISSION:  01/24/2006  DATE OF DISCHARGE:  LH                                HISTORY & PHYSICAL   REASON FOR CONSULTATION:  Rectal bleeding.   HISTORY OF PRESENT ILLNESS:  [name] is a very pleasant 45-year-  old African-American female sent over per the courtesy of Dr. Syliva Overman to further evaluate a several-month history of intermittent rectal  bleeding.  She has had a little tendency towards diarrhea recently but still  more or less has 1-2 bowel movements daily.  She has seen some bright red  blood associated with the bowel movement.  She denies constipation.  She has  not had any melena.  Off and on she has had some vague left lower quadrant  abdominal pain but none recently.  She tells me she has lost a good 50  pounds in the past one year consciously trying to lose weight trying to  become fit.  She walks a couple of miles every day and has cut back her  caloric intake.  Really no upper GI tract symptoms such as odynophagia,  dysphagia, early satiety, reflux symptoms, nausea or vomiting.  She has  never had her lower GI tract imaged.  There is no family history of IBD,  colorectal neoplasia, or other gastrointestinal illness.   PAST MEDICAL HISTORY:  Significant for hypertension, type 2 diabetes  mellitus, depression.  She [facility]ie [facility].  Sinus headaches, for which she previously took Advil but now is  taking __________ with excellent improvement in those symptoms.   PAST SURGICAL HISTORY:  None.   ALLERGIES:  PENICILLIN.   FAMILY HISTORY:  Father died with CVA at age 52.  Mother is alive and in  good health.  Otherwise, as stated above.   SOCIAL HISTORY:   Patient is single.  She has two children.  She works as a  Financial planner at [employer].  She smokes one pack of cigarettes per day.  She rarely  consumes alcohol.   REVIEW OF SYSTEMS:  No chest pain, dyspnea on exertion.  Weight loss as  outlined above.  No fever or chills.   PHYSICAL EXAMINATION:  GENERAL:  A pleasant 45 year old lady resting  comfortably.  Weight 194.  Height 5 feet 4.  Temp 98.9, BP 120/74, pulse 76.  SKIN:  Warm and dry.  HEENT:  No scleral icterus.  Conjunctivae are pink.  Oral cavity, no  lesions.  CHEST:  Lungs are clear to auscultation.  CARDIAC:  Regular rate and rhythm without murmur, rub or gallop.  ABDOMEN:  Nondistended.  Positive bowel sounds.  Soft and nontender without  appreciable mass or organomegaly.  RECTAL:  Deferred until the time of colonoscopy.   IMPRESSION:  [name] is a pleasant 45 year old lady with  intermittent hematochezia  for the last 3-4 months.  These symptoms demand  further evaluation.  To this end, I have offered [name] a colonoscopy.  Potential risks, benefits and alternatives have been reviewed, questions  answered.  She is agreeable.  We will plan for colonoscopy in the very near  future at [facility], make further recommendations at that time.   I would like to thank Dr. Syliva Overman for allowing me to see this very  nice lady.      Jonathon Bellows, M.D.  Electronically Signed     RMR/MEDQ  D:  01/24/2006  T:  01/26/2006  Job:  [id number]   cc:   Milus Mallick. Lodema Hong, M.D.  Fax: [phone]

## 2010-09-01 NOTE — H&P (Signed)
[name], [name]             ACCOUNT NO.:  [id number]   MEDICAL RECORD NO.:  [id number]          PATIENT TYPE:  INP   LOCATION:  A217                          FACILITY:  [facility]   PHYSICIAN:  Hanley Hays. [place], M.D.DATE OF BIRTH:  10-Apr-1966   DATE OF ADMISSION:  09/18/2005  DATE OF DISCHARGE:  LH                                HISTORY & PHYSICAL   HISTORY OF PRESENT ILLNESS:  A 45 year old African American female followed  by [place], followed by the health department with a past medical  history remarkable for diabetes mellitus x5 years and hypertension, who  smokes 1-1/2 pack of cigarettes per day, with a positive family history of  her father dying at age 33 secondary to MI, who presents to the emergency  room complaining of some left chest pain radiating from her left breast  laterally, occurred at this a.m. at rest and persisted off and on throughout  the day.  Because of more intense pain, she presented to the emergency room,  where she was hemodynamically stable.  Initial EKG was normal and first set  of cardiac markers are normal as well.  However, given her risk factors, she  is being admitted to the hospital for further evaluation.  Her past history  is remarkable for a stress test performed about 2 years ago secondary to  some substernal chest pain, which reportedly was negative.  She has had no  other workup.  She denies any dyspnea on exertion, orthopnea, or shortness  of breath.  She has had no edema.  She states she may have had more pain  with activities though, but cannot quantitate.  Her diabetes has been under  good control with hemoglobin A1c of 6.5, according to her report.  She  denies any alcohol or illicit drug abuse.  Currently she is unemployed.  She  lives with her two children, age 72 and 2.  She is under considerable  stressors secondary to social issues.   PAST MEDICAL HISTORY:  1.  Depression, on Geodon and Depakote.  2.   Hypertension.  3.  Diabetes mellitus.  4.  Status post pregnancy x2, uncomplicated.   MEDICATIONS:  1.  Metformin 500 mg b.i.d.  2.  Lexapro -- she thinks 20 mg daily.  3.  Iron b.i.d.  4.  Aspirin 81 daily,  5.  Depakote 250 q.a.m., 500 mg q.p.m.  6.  Geodon 280 nightly.  7.  Atenolol 25 daily.   ALLERGIES:  She has nausea and vomiting associated with PENICILLIN.   FAMILY MEDICAL HISTORY:  Mother has hypertension.  She has multiple brothers  and sisters, all in good health.  Her father died at age 85 with an MI; he  also was a heavy alcohol abuser and smoker.   REVIEW OF SYSTEMS:  The patient is basically independent.  She has had no GI  or GU complaints and considers her health otherwise to be in fairly good  health.   PHYSICAL EXAM:  GENERAL:  Exam reveals a modestly overweight female, alert,  appropriate, in no distress.  VITAL SIGNS:  Blood pressure is  149/75, temperature is afebrile at 97.8, O2  saturation is 99% on 2 L, pulse is in the 70s and sinus, respirations are  unlabored.  NECK:  No bruits are noted.  No thyromegaly or JVD.  HEENT:  Oropharynx is moist.  Teeth are in good repair.  Pupils are equal  and react to light.  Fundi are not well-visualized.  LUNGS:  Clear to auscultation, anterior and posterior.  HEART:  Regular rate and rhythm.  No murmur, gallop or rub is noted.  ABDOMEN:  Soft and nontender without bruit.  EXTREMITIES:  Without clubbing or cyanosis.  There is no edema.  NEUROLOGIC:  Exam reveals the patient to have normal mental status, follows  all commands, nonfocal neurological exam.   ASSESSMENT AND PLAN:  Chest pain in a patient with multiple risk factors.  We will obtain an echocardiogram in the morning and proceed with probable  Myoview as per nuclear medicine stress testing unless otherwise determined  by Cardiology.      Hanley Hays Josefine Class, M.D.  Electronically Signed     FED/MEDQ  D:  09/19/2005  T:  09/19/2005  Job:  [id number]

## 2010-12-27 ENCOUNTER — Ambulatory Visit (INDEPENDENT_AMBULATORY_CARE_PROVIDER_SITE_OTHER): Payer: Medicare Other | Admitting: Internal Medicine

## 2010-12-27 ENCOUNTER — Encounter: Payer: Self-pay | Admitting: Internal Medicine

## 2010-12-27 VITALS — BP 117/76 | HR 96 | Temp 97.9°F | Ht 64.0 in | Wt 187.4 lb

## 2010-12-27 DIAGNOSIS — Z8719 Personal history of other diseases of the digestive system: Secondary | ICD-10-CM

## 2010-12-27 DIAGNOSIS — J309 Allergic rhinitis, unspecified: Secondary | ICD-10-CM

## 2010-12-27 DIAGNOSIS — F17200 Nicotine dependence, unspecified, uncomplicated: Secondary | ICD-10-CM

## 2010-12-27 DIAGNOSIS — E876 Hypokalemia: Secondary | ICD-10-CM

## 2010-12-27 DIAGNOSIS — F1010 Alcohol abuse, uncomplicated: Secondary | ICD-10-CM

## 2010-12-27 DIAGNOSIS — G4700 Insomnia, unspecified: Secondary | ICD-10-CM

## 2010-12-27 DIAGNOSIS — F329 Major depressive disorder, single episode, unspecified: Secondary | ICD-10-CM

## 2010-12-27 DIAGNOSIS — I519 Heart disease, unspecified: Secondary | ICD-10-CM

## 2010-12-27 DIAGNOSIS — K219 Gastro-esophageal reflux disease without esophagitis: Secondary | ICD-10-CM

## 2010-12-27 DIAGNOSIS — Z23 Encounter for immunization: Secondary | ICD-10-CM

## 2010-12-27 DIAGNOSIS — E785 Hyperlipidemia, unspecified: Secondary | ICD-10-CM

## 2010-12-27 DIAGNOSIS — I10 Essential (primary) hypertension: Secondary | ICD-10-CM

## 2010-12-27 DIAGNOSIS — E669 Obesity, unspecified: Secondary | ICD-10-CM

## 2010-12-27 DIAGNOSIS — J302 Other seasonal allergic rhinitis: Secondary | ICD-10-CM

## 2010-12-27 DIAGNOSIS — E119 Type 2 diabetes mellitus without complications: Secondary | ICD-10-CM

## 2010-12-27 DIAGNOSIS — D649 Anemia, unspecified: Secondary | ICD-10-CM

## 2010-12-27 LAB — CBC
MCH: 27.5 pg (ref 26.0–34.0)
MCHC: 32.2 g/dL (ref 30.0–36.0)
MCV: 85.4 fL (ref 78.0–100.0)
Platelets: 273 10*3/uL (ref 150–400)
RDW: 16.2 % — ABNORMAL HIGH (ref 11.5–15.5)

## 2010-12-27 LAB — BASIC METABOLIC PANEL
Calcium: 9.6 mg/dL (ref 8.4–10.5)
Creat: 0.7 mg/dL (ref 0.50–1.10)

## 2010-12-27 LAB — FERRITIN: Ferritin: 28 ng/mL (ref 10–291)

## 2010-12-27 LAB — LIPID PANEL
Cholesterol: 164 mg/dL (ref 0–200)
Triglycerides: 180 mg/dL — ABNORMAL HIGH (ref <150)
VLDL: 36 mg/dL (ref 0–40)

## 2010-12-27 MED ORDER — PIOGLITAZONE HCL 15 MG PO TABS: 15.0000 mg | ORAL_TABLET | Freq: Every day | ORAL | Status: DC

## 2010-12-27 MED ORDER — FLUTICASONE PROPIONATE 50 MCG/ACT NA SUSP: 2.0000 | Freq: Every day | NASAL | Status: DC

## 2010-12-27 MED ORDER — FREESTYLE SYSTEM KIT: 1.0000 | PACK | Status: DC | PRN

## 2010-12-27 MED ORDER — CETIRIZINE HCL 10 MG PO TABS: 10.0000 mg | ORAL_TABLET | Freq: Every day | ORAL | Status: DC

## 2010-12-27 MED ORDER — GLUCOSE BLOOD VI STRP: ORAL_STRIP | Status: DC

## 2010-12-27 MED ORDER — ASSURE LANCETS MISC: 1.0000 "application " | Freq: Three times a day (TID) | Status: DC

## 2010-12-27 NOTE — Progress Notes (Signed)
  Subjective:    Patient ID: [name], female    DOB: 12-18-1965, 45 y.o.   MRN: [id number]  HPI Patient presents for a DM check up and to establish herself as a new patient to our clinic.  She was privously seen at [facility] community care, but has heard good things about our clinic and wanted to switch over.    Her primary concern during this visit is her seasonal allergies & insomnia.  She seems to have allergies all year round, and experiences sneezing, coughing, & itchy/watery eyes.  She tried zyrtec in the past, which worked, but when she bought the generic brand, she bought loratadine, which I informed her was not the generic for zyrtec but rather for Claritin.  She does not live with any animals, but she is a smoker.   Of note, she was recently diagnosed with bronchitis 1 week ago while visiting [place].  She was treated with a course of Z-pack.  Currently still experiencing non productive cough, but denies SOB and reports that she is feeling improvement.  Regarding her insomnia, she notes that she has difficulty falling asleep and staying asleep.  When she is awoken from sleep, it is usually by some noise.  She has been trying melatonin tablets x 1 week.  She is currently not taking another sleep aid, though she notes that's regular ibuprofen helps her sleep.  She notes that her insomnia has improved since she moved to the country in [place] and lives alone (she used to live with her mom).   Regarding her DM, she is not compliant with glipizide, as she feels like it affects her nerves. She is compliant with metformin.  She lost her glucometer some time ago.  She denies feelings of hypoglycemia such as dizziness, HA or palpitations.  She denies symptoms of hyperglycemia such as vision changes, polyuria, & polydipsia.     Review of Systems ROS: General: no fevers, chills, changes in weight, changes in appetite Skin: no rash HEENT: no blurry vision, hearing changes, sore throat Pulm: no  dyspnea, wheezing, endorses coughing CV: no chest pain, palpitations, shortness of breath Abd: no abdominal pain, nausea/vomiting, diarrhea/constipation GU: no dysuria, hematuria, polyuria Ext: no arthralgias, myalgias Neuro: no weakness, numbness, or tingling    Objective:   Physical Exam  Vitals: Reviewed General: obese, well appearing, sitting comfortably HEENT: PERRL, EOMI, no scleral icterus Cardiac: RRR, no rubs, murmurs or gallops Pulm: clear to auscultation bilaterally, moving normal volumes of air, no wheezes/rales/rhonchi Abd: soft, nontender, nondistended, BS normoactive Ext: warm and well perfused, palpable pedal and radial pulses, no pedal edema, no ulcers on feet, no hypertrophic nails Neuro: alert and oriented X3, cranial nerves II-XII grossly intact, strength and sensation to light touch equal in bilateral upper and lower extremities     Assessment & Plan:  Case & care discussed with Dr. Aundria Rud. Please see problem oriented charting for further details.  Follow up in 1 month to review labs drawn at this visit and to evaluate adjustment on new medications (actos, cetrizine, Flonase) and to follow up regarding insomnia & smoking cessation.  Follow up in 3 months for DM check.

## 2010-12-27 NOTE — Assessment & Plan Note (Addendum)
Likely linked to diagnosis of depression.  Currently taking melatonin & ibuprofen tablets. I asked that she discuss this problem with her providers at [facility], as I'd like her to stop using ibuprofen to sleep given her history of GI bleed. Please see discussion under depression for further detail.   I also discussed the importance of sleep hygiene and avoiding day time naps as well as avoiding activities other than sleep in the bedroom.

## 2010-12-27 NOTE — Assessment & Plan Note (Addendum)
Currently denies feelings of depression and suicidal ideation.  She was recently hospitalized in January 2012 at [facility] for depressed mood with suicidal ideation & psychotic features.  She notes that she feels her depression is gone, and they are weaning her off of antipsychotic medications at [facility].  She is currently only on ziprasidone.  I asked her to request a psychotherapist at [facility] as she is doing well with her medication wean, but given that she still is experiencing sleep disturbances, I noted that she would likely benefit with therapy during her bridge off of medication.  I also suggested that she use warm milk in addition to her melatonin pills to help her sleep at night, though this might become a problem should she begin to experience nocturia.    She is scheduled for follow up at [facility] in October, but will try to be seen sooner.

## 2010-12-27 NOTE — Assessment & Plan Note (Signed)
Reports allergies all year long.    Plan: Discontinue loratadine Start cetrizine & flonase Encouraged smoking cessation

## 2010-12-27 NOTE — Assessment & Plan Note (Addendum)
No fasting lipid panal within the last year.  LDL was at goal in the past.  Patient compliant with lovastatin.  Plan: Fasting lipid panal drawn today. Continue Lovastatin. Will discuss lipid panal results with her at 1 month follow up.

## 2010-12-27 NOTE — Assessment & Plan Note (Addendum)
HbA1c not at goal today. (9.1)  Patient notes that she has not been taking her glipizide because it makes her feel nervous and "off" when she does take it.  She is compliant with metformin 1000 BID.  Will switch to Actos 15mg  daily for now.  Given that actos increases ovulation, I discussed her sexual activity with her.  She is not currently sexual active nor is she on oral contraceptives.  Will follow up in 1 month to evaluate how patient is adjusting to medication.  She was instructed to call the clinic should she have any more immediate side effects.  She is not interested in insulin therapy at this time.  She expresses a will to exercise more & quit smoking.  Lipid panal was drawn during this visit.  Urine was collected to obtain urine microalbumin to evaluate for diabetic nephropathy.  Her last BMET in May 2012 does not suggest renal insufficiency.  Prior UAs have not demonstrated protein in urine.  Diabetic foot exam was done today.  Patient reports having had an eye exam in January 2012.  Flu shot was given at this visit.    Plan Continue Metformin, discontinue glipizide, start actos F/u urin microalbumin & lipid panal F/u with opthamology records to evaluate for diabetic retinopathy.   1 month follow up to evaluate medication adjustment and subsequent 3 month follow up to recheck A1c.

## 2010-12-27 NOTE — Assessment & Plan Note (Signed)
Reports occasional EtOH use.  Has not abused EtOH in 1 year.

## 2010-12-27 NOTE — Patient Instructions (Addendum)
-  Congratulations on your decision to quit smoking!  Now you only need to choose a quit date and take it step by step from there!  Let us know if you decide you need a nicotine patch taper from [name].  -Your HbA1c at this visit was 9.1, our goal is less than 7.  We are going to discontinue your glipizide and start you on Actos.  Also continue your metformin.  I will order a glucometer with lancets and test strips for you as well.  Please bring your meter with you to your next appointment.  If you have any abnormal reaction to your new medication, please call the clinic at [phone].  -We are going to start flonase & cetirizine for your allergies.  If you have any abnormal reactions, please call the clinic at [phone]  -Please be sure to follow up in 1 month - We want to see how the new medications are treating you and follow you on your path to quitting smoking!  -Please discuss your sleep difficulties with your psychiatrist at [facility].  Try to be seen by a therapist as well.

## 2010-12-27 NOTE — Assessment & Plan Note (Signed)
No active issues.  Continue omeprazole.

## 2010-12-27 NOTE — Assessment & Plan Note (Signed)
Currently well managed.  BP at goal.  Continue metoprolol & HCTZ.

## 2010-12-27 NOTE — Assessment & Plan Note (Signed)
Patient notes history of hypokalemia in past when she was on Lasix.  No longer on lasix.  Potassium has been normal over the last year.  She notes that she has also become better about supplementing potassium in her diet as she eats more bananas.    Plan: Will check BMET today. If K is normal, we will discuss discontinuing K supplements at next appointment.

## 2010-12-27 NOTE — Assessment & Plan Note (Signed)
Currently still smokes 1 PPD.  Motivated to quit.  Counseled regarding setting a quit date.  Has tried gum & patch in the past.  She has patch at home, which she will try.  I conveyed that she should call the clinic if she wishes [name] to write her for a nicotine patch taper.  She has successfully quit twice in the past, once for a stretch of 1 year, and a second time for 6 months.  She restarts smoking with life stressors.  She is especially motivated to quit given her allergies and cough.  We deferred use of chantix at this time as she has recent history of suicidal ideation with attempt in January 2012.

## 2010-12-27 NOTE — Progress Notes (Signed)
agree with plans and notes 

## 2010-12-27 NOTE — Assessment & Plan Note (Signed)
Patient is motivated to lose weight and increase exercise at this time.

## 2010-12-27 NOTE — Assessment & Plan Note (Addendum)
Denies current BRBPR.  No role for follow up with GI at this time, though I will clarify what was decided regarding colonoscopy report noting inability to rule out malignancy and need for TCS.

## 2010-12-28 ENCOUNTER — Other Ambulatory Visit: Payer: Self-pay | Admitting: Internal Medicine

## 2010-12-28 DIAGNOSIS — K219 Gastro-esophageal reflux disease without esophagitis: Secondary | ICD-10-CM

## 2010-12-28 LAB — MICROALBUMIN / CREATININE URINE RATIO: Microalb, Ur: 0.5 mg/dL (ref 0.00–1.89)

## 2010-12-28 MED ORDER — OMEPRAZOLE 20 MG PO CPDR: 20.0000 mg | DELAYED_RELEASE_CAPSULE | Freq: Every day | ORAL | Status: DC

## 2010-12-30 ENCOUNTER — Encounter (HOSPITAL_COMMUNITY): Payer: Self-pay | Admitting: *Deleted

## 2010-12-30 ENCOUNTER — Emergency Department (HOSPITAL_COMMUNITY)
Admission: EM | Admit: 2010-12-30 | Discharge: 2010-12-30 | Disposition: A | Discharge status: Home / Self Care | Payer: Medicare Other | Attending: Emergency Medicine | Admitting: Emergency Medicine

## 2010-12-30 DIAGNOSIS — I10 Essential (primary) hypertension: Secondary | ICD-10-CM | POA: Insufficient documentation

## 2010-12-30 DIAGNOSIS — E119 Type 2 diabetes mellitus without complications: Secondary | ICD-10-CM | POA: Insufficient documentation

## 2010-12-30 DIAGNOSIS — K219 Gastro-esophageal reflux disease without esophagitis: Secondary | ICD-10-CM | POA: Insufficient documentation

## 2010-12-30 DIAGNOSIS — I2510 Atherosclerotic heart disease of native coronary artery without angina pectoris: Secondary | ICD-10-CM | POA: Insufficient documentation

## 2010-12-30 DIAGNOSIS — D649 Anemia, unspecified: Secondary | ICD-10-CM | POA: Insufficient documentation

## 2010-12-30 DIAGNOSIS — Z760 Encounter for issue of repeat prescription: Secondary | ICD-10-CM | POA: Insufficient documentation

## 2010-12-30 DIAGNOSIS — F1010 Alcohol abuse, uncomplicated: Secondary | ICD-10-CM | POA: Insufficient documentation

## 2010-12-30 DIAGNOSIS — F17200 Nicotine dependence, unspecified, uncomplicated: Secondary | ICD-10-CM | POA: Insufficient documentation

## 2010-12-30 MED ORDER — ZIPRASIDONE HCL 80 MG PO CAPS: ORAL_CAPSULE | ORAL | Status: DC

## 2010-12-30 NOTE — ED Notes (Signed)
PA in with pt 

## 2010-12-30 NOTE — ED Provider Notes (Signed)
History     CSN: [id number] Arrival date & time: 12/30/2010  7:39 PM   Chief Complaint  Patient presents with  . Medication Refill     (Include location/radiation/quality/duration/timing/severity/associated sxs/prior treatment) HPI Comments: Pt recently increased her dose of geodon from 160 mg daily to 240 mg with good results.  She notified her MD at [facility] who agreed with her action.  She has as a result run out of her medicine and her MD refuses to write another rx until she sees her in ~ 6 weeks.  Pt ran out yest.  The history is provided by the patient. No language interpreter was used.     Past Medical History  Diagnosis Date  . ASCVD (arteriosclerotic cardiovascular disease)     Minimal at cath in 07;negative pharm.stress nuclear study in 8/08 with nl EF; neg stress echo in 10  . DM (diabetes mellitus) 2000    onset/no insulin  . Hyperlipidemia   . HTN (hypertension) `    during treatment with Geodon  . GERD (gastroesophageal reflux disease)   . Anemia     Iron deficiency  . Alcohol abuse   . Depression   . Community acquired pneumonia 01/03/10    with pleural effusion-[facility] acute resp failure  . Obesity   . Diastolic dysfunction   . Pruritic rash      Past Surgical History  Procedure Date  . Dilation and curettage, diagnostic / therapeutic     Family History  Problem Relation Age of Onset  . Colon cancer Neg Hx     History  Substance Use Topics  . Smoking status: Current Everyday Smoker -- 1.0 packs/day    Types: Cigarettes  . Smokeless tobacco: Not on file  . Alcohol Use: No    OB History    Grav Para Term Preterm Abortions TAB SAB Ect Mult Living                  Review of Systems  Psychiatric/Behavioral: Negative for behavioral problems, confusion and agitation.  All other systems reviewed and are negative.    Allergies  Penicillins; Sulfonamide derivatives; Metronidazole; and Sulfamethoxazole w/trimethoprim  Home  Medications   Current Outpatient Rx  Name Route Sig Dispense Refill  . ASSURE LANCETS MISC Does not apply 1 application by Does not apply route 3 (three) times daily. 100 each 12  . CETIRIZINE HCL 10 MG PO TABS Oral Take 1 tablet (10 mg total) by mouth daily. 1 tablet 4  . FERROUS GLUCONATE 325 MG PO TABS Oral Take 325 mg by mouth daily with breakfast.      . FLUTICASONE PROPIONATE 50 MCG/ACT NA SUSP Nasal Place 2 sprays into the nose daily. 16 g 2  . GLUCOSE BLOOD VI STRP  Use as instructed 100 each 12  . FREESTYLE SYSTEM KIT Does not apply 1 each by Does not apply route as needed for other. Please check blood sugar three times a day. 1 each 0  . HYDROCHLOROTHIAZIDE 25 MG PO TABS Oral Take 25 mg by mouth daily.      . IBUPROFEN 200 MG PO TABS Oral Take 200 mg by mouth every 6 (six) hours as needed.      Marland Kitchen LOVASTATIN 20 MG PO TABS Oral Take 20 mg by mouth at bedtime.      Marland Kitchen MELATONIN 5 MG PO TABS Oral Take 1 tablet by mouth at bedtime as needed.      Marland Kitchen METFORMIN HCL 1000 MG PO  TABS Oral Take 1,000 mg by mouth 2 (two) times daily with a meal.      . METOPROLOL TARTRATE 50 MG PO TABS Oral Take 50 mg by mouth 2 (two) times daily.      Marland Kitchen OMEPRAZOLE 20 MG PO CPDR Oral Take 1 capsule (20 mg total) by mouth daily. 90 capsule 3  . PIOGLITAZONE HCL 15 MG PO TABS Oral Take 1 tablet (15 mg total) by mouth daily. 30 tablet 3  . POTASSIUM CHLORIDE CRYS CR 20 MEQ PO TBCR Oral Take 20 mEq by mouth 2 (two) times daily.      Marland Kitchen ZIPRASIDONE HCL 40 MG PO CAPS Oral Take 80 mg by mouth 2 (two) times daily with a meal.     . NITROGLYCERIN 0.4 MG SL SUBL Sublingual Place 0.4 mg under the tongue every 5 (five) minutes as needed.        Physical Exam    BP 145/91  Pulse 107  Temp(Src) 98.6 F (37 C) (Oral)  Resp 20  Ht 5\' 4"  (1.626 m)  Wt 186 lb (84.369 kg)  BMI 31.93 kg/m2  SpO2 100%  LMP 12/20/2010  Physical Exam  Nursing note and vitals reviewed. Constitutional: She is oriented to person, place, and  time. Vital signs are normal. She appears well-developed and well-nourished. No distress.  HENT:  Head: Normocephalic and atraumatic.  Right Ear: External ear normal.  Left Ear: External ear normal.  Nose: Nose normal.  Mouth/Throat: No oropharyngeal exudate.  Eyes: Conjunctivae and EOM are normal. Pupils are equal, round, and reactive to light. Right eye exhibits no discharge. Left eye exhibits no discharge. No scleral icterus.  Neck: Normal range of motion. Neck supple. No JVD present. No tracheal deviation present. No thyromegaly present.  Cardiovascular: Normal rate, regular rhythm, normal heart sounds, intact distal pulses and normal pulses.  Exam reveals no gallop and no friction rub.   No murmur heard. Pulmonary/Chest: Effort normal and breath sounds normal. No stridor. No respiratory distress. She has no wheezes. She has no rales. She exhibits no tenderness.  Abdominal: Soft. Normal appearance and bowel sounds are normal. She exhibits no distension and no mass. There is no tenderness. There is no rebound and no guarding.  Musculoskeletal: Normal range of motion. She exhibits no edema and no tenderness.  Lymphadenopathy:    She has no cervical adenopathy.  Neurological: She is alert and oriented to person, place, and time. She has normal reflexes. No cranial nerve deficit. Coordination normal. GCS eye subscore is 4. GCS verbal subscore is 5. GCS motor subscore is 6.  Skin: Skin is warm and dry. No rash noted. She is not diaphoretic.  Psychiatric: She has a normal mood and affect. Her speech is normal and behavior is normal. Judgment and thought content normal. Cognition and memory are normal.    ED Course  Procedures  Results for orders placed in visit on 12/27/10  POCT GLYCOSYLATED HEMOGLOBIN (HGB A1C)      Component Value Range   Hemoglobin A1C 9.1    GLUCOSE, CAPILLARY      Component Value Range   Glucose-Capillary 243 (*) 70 - 99 (mg/dL)  MICROALBUMIN / CREATININE URINE  RATIO      Component Value Range   Microalb, Ur 0.50  0.00 - 1.89 (mg/dL)   Creatinine, Urine 562.1     Microalb Creat Ratio 3.8  0.0 - 30.0 (mg/g)  LIPID PANEL      Component Value Range   Cholesterol 164  0 - 200 (mg/dL)   Triglycerides 409 (*) <150 (mg/dL)   HDL 43  >81 (mg/dL)   Total CHOL/HDL Ratio 3.8     VLDL 36  0 - 40 (mg/dL)   LDL Cholesterol 85  0 - 99 (mg/dL)  CBC      Component Value Range   WBC 9.1  4.0 - 10.5 (K/uL)   RBC 4.80  3.87 - 5.11 (MIL/uL)   Hemoglobin 13.2  12.0 - 15.0 (g/dL)   HCT 19.1  47.8 - 29.5 (%)   MCV 85.4  78.0 - 100.0 (fL)   MCH 27.5  26.0 - 34.0 (pg)   MCHC 32.2  30.0 - 36.0 (g/dL)   RDW 62.1 (*) 30.8 - 15.5 (%)   Platelets 273  150 - 400 (K/uL)  BASIC METABOLIC PANEL      Component Value Range   Sodium 135  135 - 145 (mEq/L)   Potassium 4.2  3.5 - 5.3 (mEq/L)   Chloride 101  96 - 112 (mEq/L)   CO2 23  19 - 32 (mEq/L)   Glucose, Bld 284 (*) 70 - 99 (mg/dL)   BUN 12  6 - 23 (mg/dL)   Creat 6.57  8.46 - 9.62 (mg/dL)   Calcium 9.6  8.4 - 95.2 (mg/dL)  FERRITIN      Component Value Range   Ferritin 28  10 - 291 (ng/mL)   No results found.   No diagnosis found.   MDM        Worthy Rancher, PA 12/30/10 2120

## 2010-12-30 NOTE — ED Notes (Signed)
Pt states she needs a refill on her geodon. Pt states she has been taking 160mg  at night and 80mg  on the morning. Pt states her doctor is no longer at [facility] and when she informed person at [facility] that she had increased her dose, they told her to come here to get a new prescription for a higher dose. Pt states she has been feeling nervous and jittery.

## 2010-12-31 IMAGING — CR DG CHEST 1V PORT
1 series · 1 of 1 positions shown · non-contrast
Comparison: Chest 02/02/2010.

CLINICAL DATA: Shortness of breath.

PORTABLE CHEST - 1 VIEW

[view not recorded]
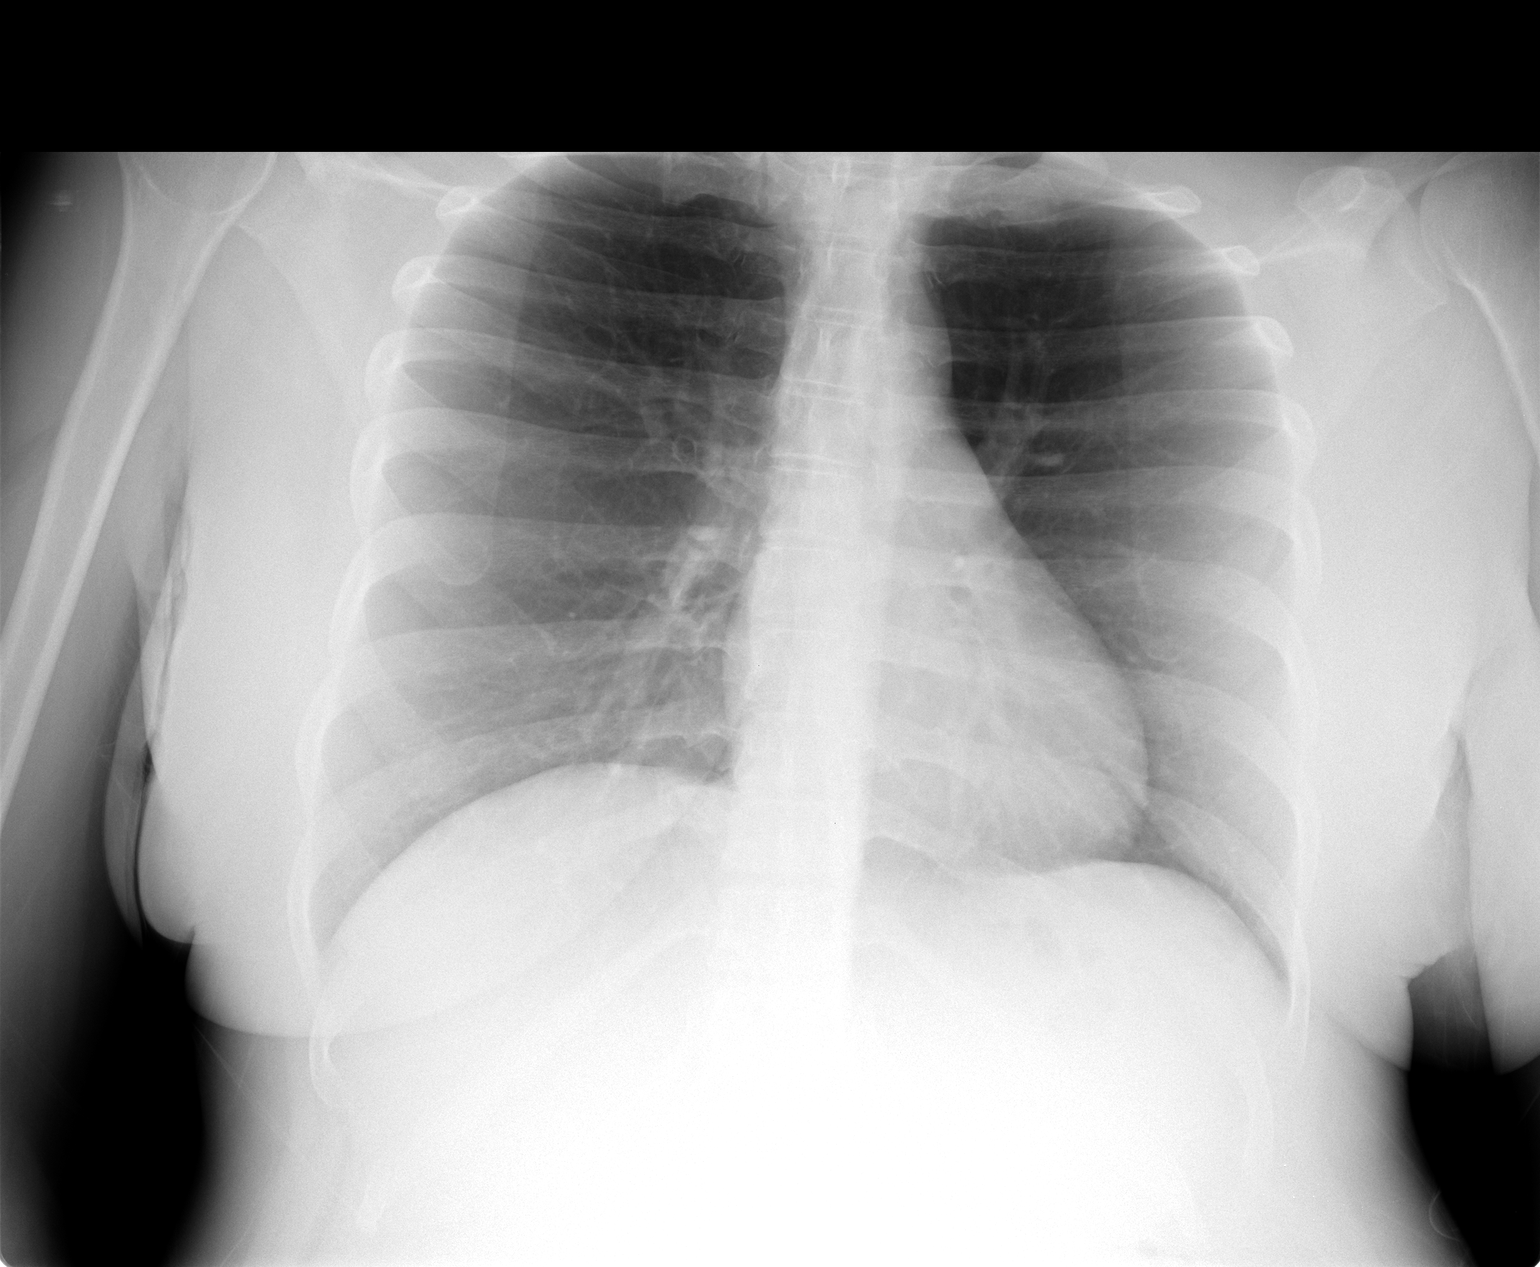

[1 of 1 positions shown; findings below may reference images not displayed]

FINDINGS: The lungs are clear.  No pneumothorax or pleural
effusion.  Heart size normal.
IMPRESSION: No acute disease.

## 2011-01-01 NOTE — ED Provider Notes (Signed)
Medical screening examination/treatment/procedure(s) were performed by non-physician practitioner and as supervising physician I was immediately available for consultation/collaboration.  Donnetta Hutching, MD 01/01/11 [phone]

## 2011-01-03 LAB — CBC
MCHC: 33.3
MCV: 87.3
Platelets: 160
RDW: 14.8
WBC: 8.5

## 2011-01-03 LAB — BASIC METABOLIC PANEL
BUN: 13
CO2: 27
Chloride: 102
Creatinine, Ser: 0.74

## 2011-01-03 LAB — POCT CARDIAC MARKERS
Myoglobin, poc: 19.2
Myoglobin, poc: 23.6
Operator id: 106841
Operator id: 216221

## 2011-01-03 LAB — DIFFERENTIAL
Basophils Absolute: 0
Basophils Relative: 0
Eosinophils Absolute: 0.1
Neutrophils Relative %: 59

## 2011-01-03 LAB — D-DIMER, QUANTITATIVE: D-Dimer, Quant: 0.22

## 2011-01-05 IMAGING — CR DG CHEST 2V
2 series · 2 of 2 positions shown · non-contrast
Comparison: February 05, 2010

CLINICAL DATA: Smoker, hypertension, shortness of breath

CHEST - 2 VIEW

[view not recorded (1 of 2)]
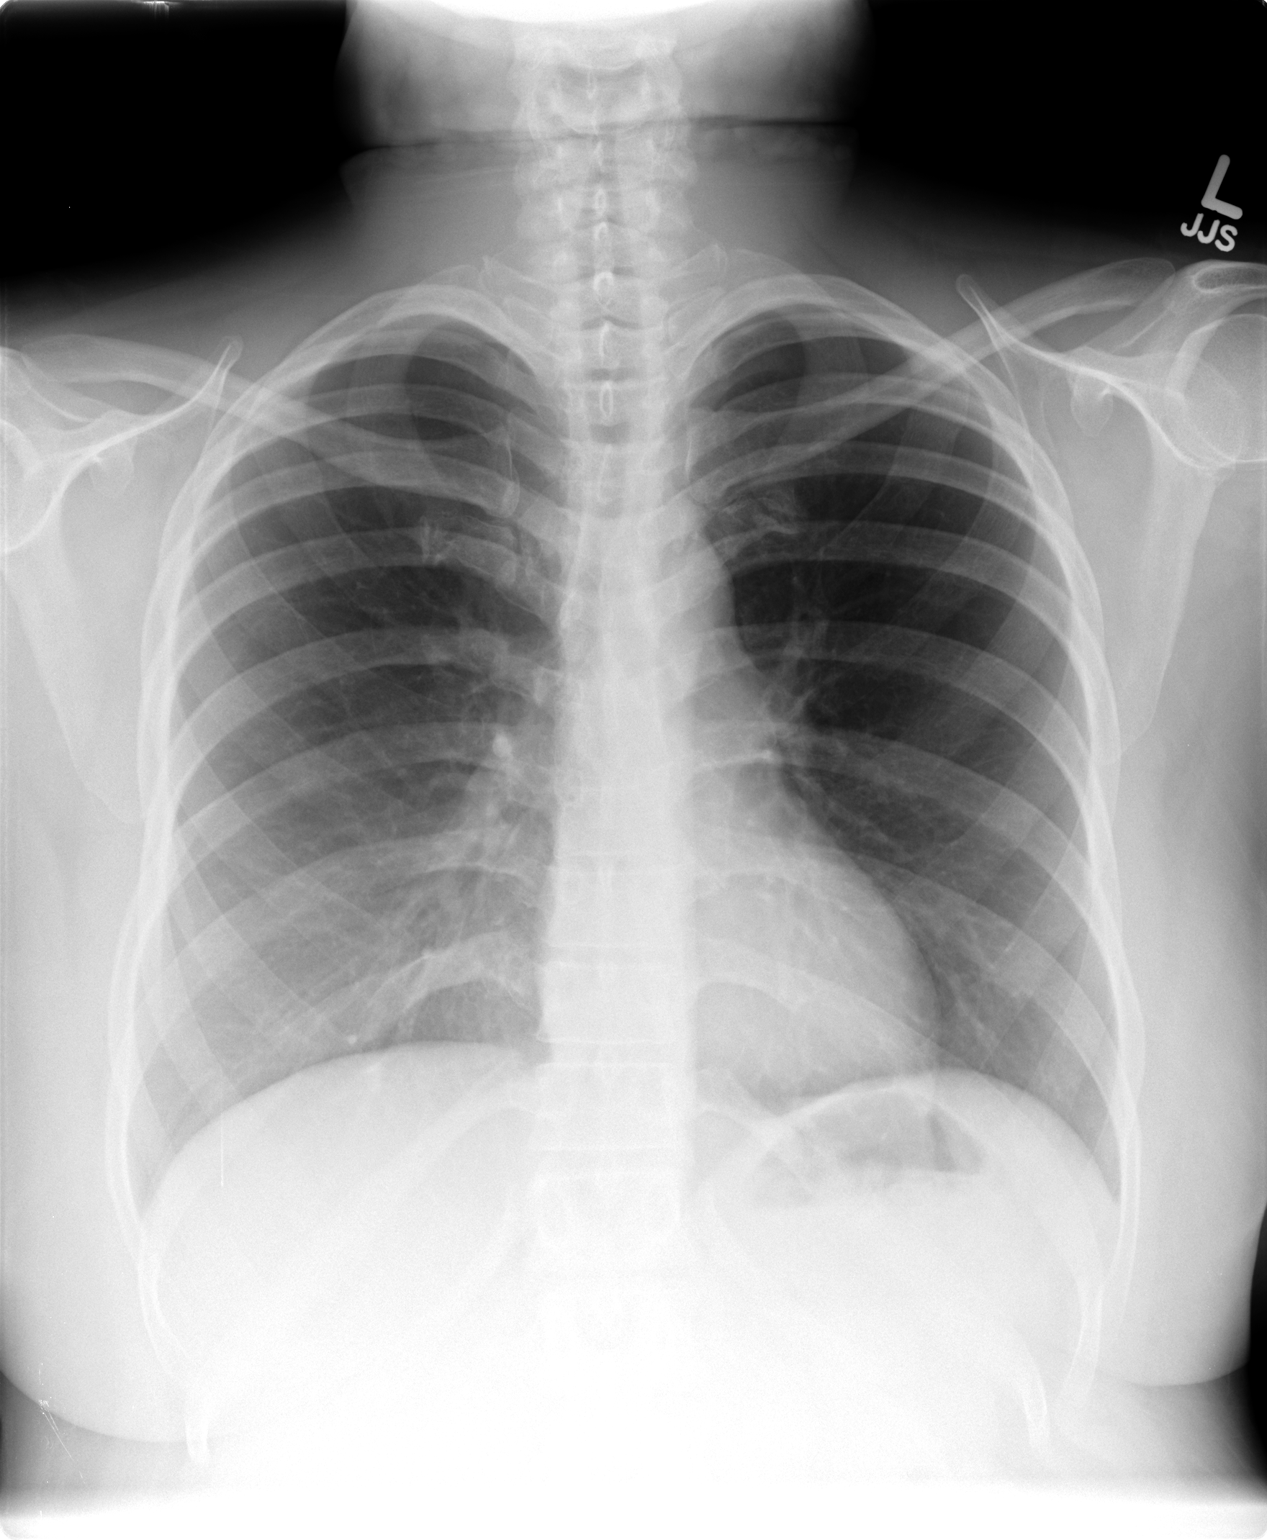

[view not recorded (2 of 2)]
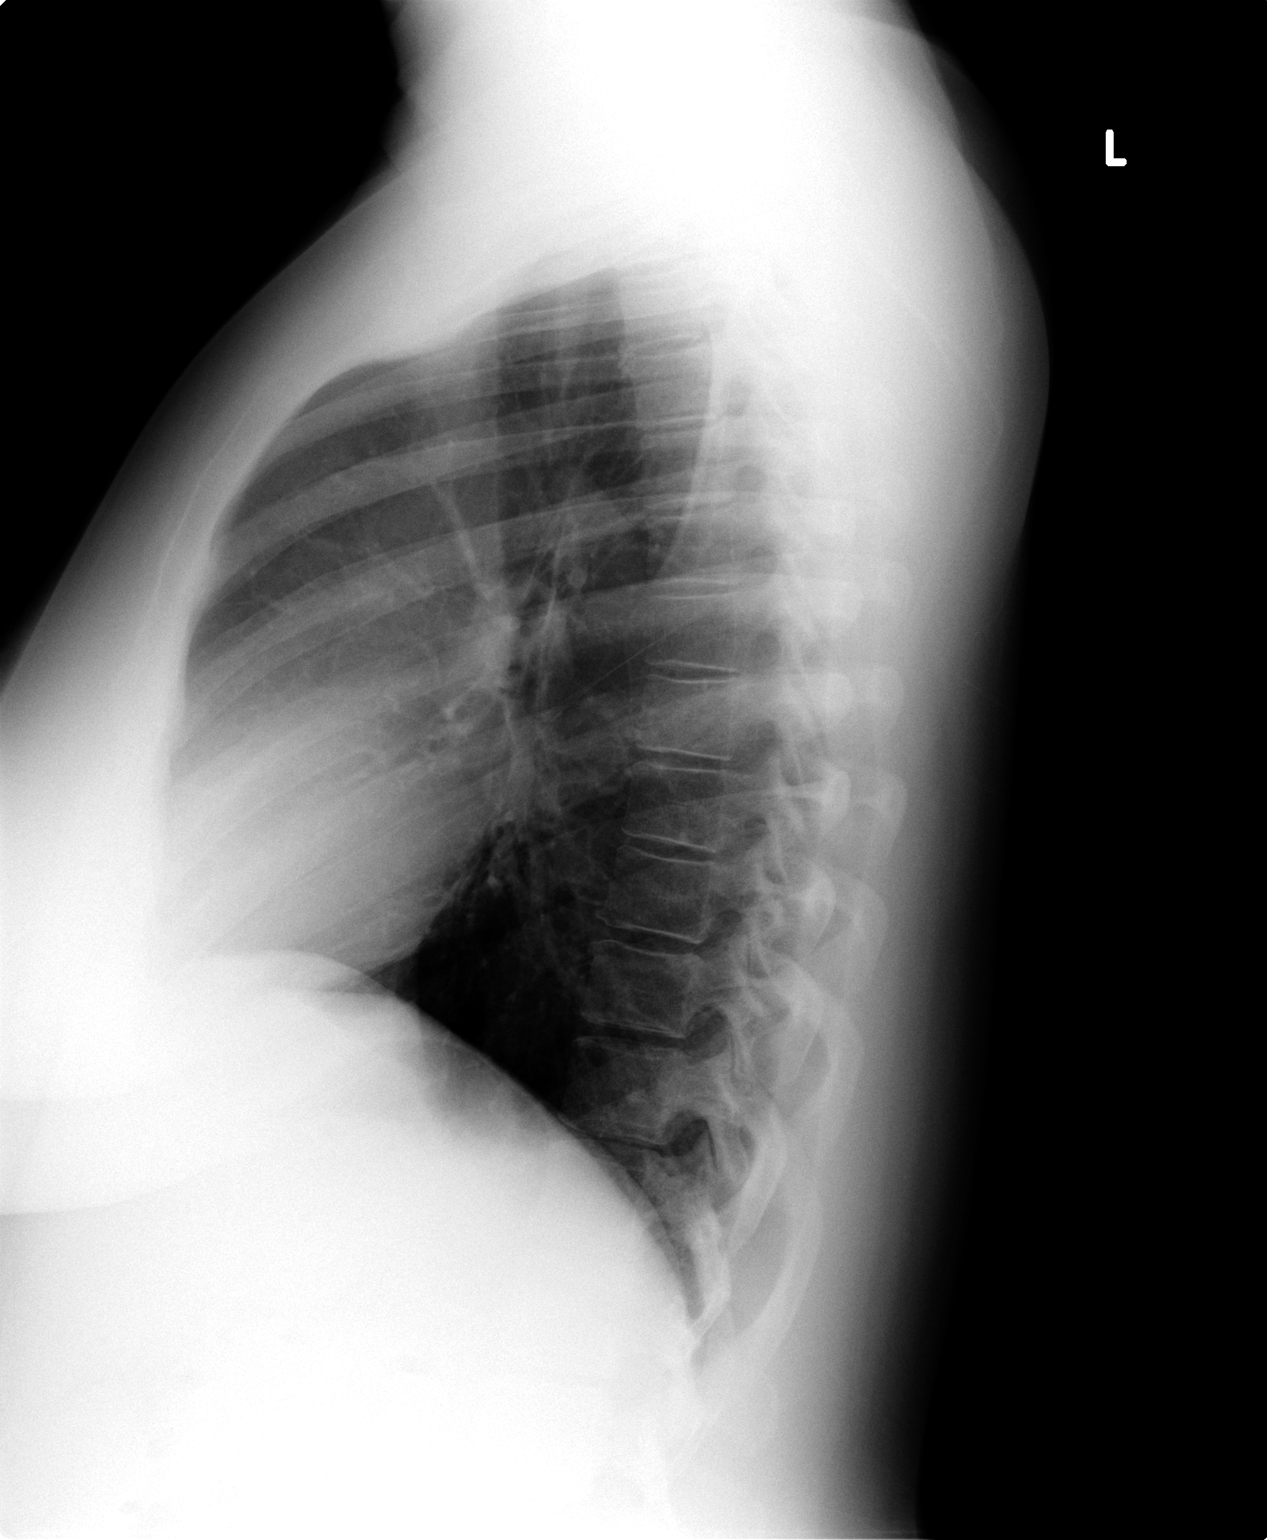

[2 of 2 positions shown; findings below may reference images not displayed]

FINDINGS: The cardiac silhouette, mediastinum, pulmonary
vasculature are within normal limits.  Both lungs are clear.
There is no acute bony abnormality.
IMPRESSION: There is no evidence of acute cardiac or pulmonary process.

## 2011-01-10 ENCOUNTER — Encounter (HOSPITAL_COMMUNITY): Payer: Self-pay | Admitting: *Deleted

## 2011-01-10 ENCOUNTER — Emergency Department (HOSPITAL_COMMUNITY): Payer: Medicare Other

## 2011-01-10 ENCOUNTER — Emergency Department (HOSPITAL_COMMUNITY)
Admission: EM | Admit: 2011-01-10 | Discharge: 2011-01-10 | Disposition: A | Discharge status: Home / Self Care | Payer: Medicare Other | Attending: Emergency Medicine | Admitting: Emergency Medicine

## 2011-01-10 ENCOUNTER — Telehealth: Payer: Self-pay | Admitting: *Deleted

## 2011-01-10 DIAGNOSIS — R05 Cough: Secondary | ICD-10-CM | POA: Insufficient documentation

## 2011-01-10 DIAGNOSIS — F17200 Nicotine dependence, unspecified, uncomplicated: Secondary | ICD-10-CM | POA: Insufficient documentation

## 2011-01-10 DIAGNOSIS — I10 Essential (primary) hypertension: Secondary | ICD-10-CM | POA: Insufficient documentation

## 2011-01-10 DIAGNOSIS — R739 Hyperglycemia, unspecified: Secondary | ICD-10-CM

## 2011-01-10 DIAGNOSIS — E119 Type 2 diabetes mellitus without complications: Secondary | ICD-10-CM | POA: Insufficient documentation

## 2011-01-10 DIAGNOSIS — E785 Hyperlipidemia, unspecified: Secondary | ICD-10-CM | POA: Insufficient documentation

## 2011-01-10 DIAGNOSIS — Z888 Allergy status to other drugs, medicaments and biological substances status: Secondary | ICD-10-CM | POA: Insufficient documentation

## 2011-01-10 DIAGNOSIS — R059 Cough, unspecified: Secondary | ICD-10-CM | POA: Insufficient documentation

## 2011-01-10 DIAGNOSIS — Z882 Allergy status to sulfonamides status: Secondary | ICD-10-CM | POA: Insufficient documentation

## 2011-01-10 DIAGNOSIS — K219 Gastro-esophageal reflux disease without esophagitis: Secondary | ICD-10-CM | POA: Insufficient documentation

## 2011-01-10 DIAGNOSIS — Z880 Allergy status to penicillin: Secondary | ICD-10-CM | POA: Insufficient documentation

## 2011-01-10 DIAGNOSIS — I2510 Atherosclerotic heart disease of native coronary artery without angina pectoris: Secondary | ICD-10-CM | POA: Insufficient documentation

## 2011-01-10 LAB — PREGNANCY, URINE: Preg Test, Ur: NEGATIVE

## 2011-01-10 LAB — URINALYSIS, ROUTINE W REFLEX MICROSCOPIC
Glucose, UA: >1000 mg/dL — AB
Leukocytes, UA: NEGATIVE
Protein, ur: NEGATIVE mg/dL
Specific Gravity, Urine: 1.02 (ref 1.005–1.030)
Urobilinogen, UA: 0.2 mg/dL (ref 0.0–1.0)

## 2011-01-10 LAB — CBC
HCT: 35.9 % — ABNORMAL LOW (ref 36.0–46.0)
MCH: 28.4 pg (ref 26.0–34.0)
MCHC: 34 g/dL (ref 30.0–36.0)
MCV: 83.5 fL (ref 78.0–100.0)
RDW: 14.7 % (ref 11.5–15.5)
WBC: 8.8 10*3/uL (ref 4.0–10.5)

## 2011-01-10 LAB — COMPREHENSIVE METABOLIC PANEL
Albumin: 3.3 g/dL — ABNORMAL LOW (ref 3.5–5.2)
BUN: 12 mg/dL (ref 6–23)
Calcium: 9.5 mg/dL (ref 8.4–10.5)
Chloride: 99 mEq/L (ref 96–112)
Creatinine, Ser: 0.73 mg/dL (ref 0.50–1.10)
GFR calc non Af Amer: >60 mL/min (ref >60)
Total Bilirubin: 0.2 mg/dL — ABNORMAL LOW (ref 0.3–1.2)

## 2011-01-10 LAB — RAPID URINE DRUG SCREEN, HOSP PERFORMED: Opiates: NOT DETECTED

## 2011-01-10 LAB — GLUCOSE, CAPILLARY: Glucose-Capillary: 311 mg/dL — ABNORMAL HIGH (ref 70–99)

## 2011-01-10 MED ORDER — SODIUM CHLORIDE 0.9 % IV BOLUS (SEPSIS)
1000.0000 mL | Freq: Once | INTRAVENOUS | Status: AC
  Administered 2011-01-10: 1000 mL via INTRAVENOUS

## 2011-01-10 NOTE — Telephone Encounter (Signed)
Pt was seen and released in ED last night/early AM 2/2 elevated glucose of 400.  Pt states that she currently takes Actos 15mg  daily and metformin 1000mg  twice a day. She is requesting PCP to increase her Actos dose for better control.  She uses [facility] and wishes to have rx sent in.  Will forward to PCP for review.Criss Alvine, Darlene Cassady9/26/201210:30 AM

## 2011-01-10 NOTE — Telephone Encounter (Signed)
I called to discuss patient's visit to the ED last night.  She notes that she went to ED because her blood sugars were in the 400s.  She reports that she didn't eat well yesterday either (biscuits, etc).  Today her blood sugar has been around 200, and not below 150.  She reports that she also plans to exercise and eat better from now on.  I told her that I wanted to hold off on increasing her Actos until at least her next appointment, especially because of her intolerance of glipizide.    She also expressed that she has been feeling very tired for the last few days.  She notes that she hasn't been taking her iron.  I asked that she restart her iron, and keep an eye on her symptoms.  Should she feel worse, she should call our clinic for [name] to re-evaluate.  She is scheduled for a follow up appt on Oct 10th.  At that time, she may require a CBC, as a common side effect of Actos is dilutional anemia.

## 2011-01-10 NOTE — ED Provider Notes (Signed)
History     CSN: [id number] Arrival date & time: 01/10/2011  4:05 AM  Chief Complaint  Patient presents with  . Hyperglycemia    HPI  (Consider location/radiation/quality/duration/timing/severity/associated sxs/prior treatment)  Patient is a 45 y.o. female presenting with diabetes problem.  Diabetes She presents for her follow-up diabetic visit. She has type 2 diabetes mellitus. No MedicAlert identification noted. Her disease course has been fluctuating. Pertinent negatives for hypoglycemia include no confusion, dizziness, headaches, hunger, mood changes, nervousness/anxiousness, pallor, seizures, sleepiness, speech difficulty, sweats or tremors. Pertinent negatives for diabetes include no blurred vision, no chest pain, no fatigue, no foot paresthesias, no foot ulcerations, no polydipsia, no polyphagia, no polyuria, no visual change, no weakness and no weight loss. There are no hypoglycemic complications. Symptoms are worsening. Symptoms have been present for 1 day. Pertinent negatives for diabetic complications include no CVA or heart disease. Risk factors for coronary artery disease include diabetes mellitus. Current diabetic treatment includes oral agent (dual therapy). She is compliant with treatment most of the time. Her weight is stable. She is following a generally unhealthy diet. When asked about meal planning, she reported none. She has not had a previous visit with a dietician. Her home blood glucose trend is fluctuating dramatically. (Non compliant with testing today read high and she was seeing things like people over her bed, mom states she thought she was sleeping when it happened. Denies SI or HI,  No AH) An ACE inhibitor/angiotensin II receptor blocker is being taken. She does not see a podiatrist.   Past Medical History  Diagnosis Date  . ASCVD (arteriosclerotic cardiovascular disease)     Minimal at cath in 07;negative pharm.stress nuclear study in 8/08 with nl EF; neg stress  echo in 10  . DM (diabetes mellitus) 2000    onset/no insulin  . Hyperlipidemia   . HTN (hypertension) `    during treatment with Geodon  . GERD (gastroesophageal reflux disease)   . Anemia     Iron deficiency  . Alcohol abuse   . Depression   . Community acquired pneumonia 01/03/10    with pleural effusion-[facility] acute resp failure  . Obesity   . Diastolic dysfunction   . Pruritic rash     Past Surgical History  Procedure Date  . Dilation and curettage, diagnostic / therapeutic     Family History  Problem Relation Age of Onset  . Colon cancer Neg Hx     History  Substance Use Topics  . Smoking status: Current Everyday Smoker -- 1.0 packs/day    Types: Cigarettes  . Smokeless tobacco: Not on file  . Alcohol Use: No    OB History    Grav Para Term Preterm Abortions TAB SAB Ect Mult Living                  Review of Systems  Review of Systems  Constitutional: Negative for weight loss and fatigue.  HENT: Negative for facial swelling, neck pain and neck stiffness.   Eyes: Negative for blurred vision and discharge.  Respiratory: Negative for apnea.   Cardiovascular: Negative for chest pain.  Genitourinary: Negative for dysuria, polyuria and difficulty urinating.  Musculoskeletal: Negative for arthralgias.  Skin: Negative for pallor.  Neurological: Negative for dizziness, tremors, seizures, speech difficulty, weakness and headaches.  Hematological: Negative.  Negative for polydipsia and polyphagia.  Psychiatric/Behavioral: Negative for confusion. The patient is not nervous/anxious.     Allergies  Penicillins; Sulfonamide derivatives; Metronidazole; and Sulfamethoxazole w/trimethoprim  Home Medications   Current Outpatient Rx  Name Route Sig Dispense Refill  . ASSURE LANCETS MISC Does not apply 1 application by Does not apply route 3 (three) times daily. 100 each 12  . CETIRIZINE HCL 10 MG PO TABS Oral Take 1 tablet (10 mg total) by mouth daily. 1  tablet 4  . FERROUS GLUCONATE 325 MG PO TABS Oral Take 325 mg by mouth daily with breakfast.      . FLUTICASONE PROPIONATE 50 MCG/ACT NA SUSP Nasal Place 2 sprays into the nose daily. 16 g 2  . GLUCOSE BLOOD VI STRP  Use as instructed 100 each 12  . FREESTYLE SYSTEM KIT Does not apply 1 each by Does not apply route as needed for other. Please check blood sugar three times a day. 1 each 0  . HYDROCHLOROTHIAZIDE 25 MG PO TABS Oral Take 25 mg by mouth daily.      . IBUPROFEN 200 MG PO TABS Oral Take 200 mg by mouth every 6 (six) hours as needed.      Marland Kitchen LOVASTATIN 20 MG PO TABS Oral Take 20 mg by mouth at bedtime.      Marland Kitchen MELATONIN 5 MG PO TABS Oral Take 1 tablet by mouth at bedtime as needed.      Marland Kitchen METFORMIN HCL 1000 MG PO TABS Oral Take 1,000 mg by mouth 2 (two) times daily with a meal.      . METOPROLOL TARTRATE 50 MG PO TABS Oral Take 50 mg by mouth 2 (two) times daily.      Marland Kitchen NITROGLYCERIN 0.4 MG SL SUBL Sublingual Place 0.4 mg under the tongue every 5 (five) minutes as needed.      Marland Kitchen OMEPRAZOLE 20 MG PO CPDR Oral Take 1 capsule (20 mg total) by mouth daily. 90 capsule 3  . PIOGLITAZONE HCL 15 MG PO TABS Oral Take 1 tablet (15 mg total) by mouth daily. 30 tablet 3  . POTASSIUM CHLORIDE CRYS CR 20 MEQ PO TBCR Oral Take 20 mEq by mouth 2 (two) times daily.      Marland Kitchen ZIPRASIDONE HCL 40 MG PO CAPS Oral Take 80 mg by mouth 2 (two) times daily with a meal.     . ZIPRASIDONE HCL 80 MG PO CAPS  One tab po q AM.  2 po QHS 126 capsule 0    Physical Exam    BP 132/80  Pulse 90  Temp(Src) 98.8 F (37.1 C) (Oral)  Resp 20  Ht 5\' 4"  (1.626 m)  Wt 186 lb (84.369 kg)  BMI 31.93 kg/m2  SpO2 99%  LMP 12/20/2010  Physical Exam  Constitutional: She is oriented to person, place, and time. She appears well-developed and well-nourished. No distress.  HENT:  Head: Normocephalic and atraumatic.  Mouth/Throat: Oropharynx is clear and moist. No oropharyngeal exudate.  Eyes: EOM are normal. Pupils are equal,  round, and reactive to light. Right eye exhibits no discharge. Left eye exhibits no discharge.  Neck: Normal range of motion. Neck supple. No JVD present.  Cardiovascular: Normal rate and regular rhythm.   No murmur heard. Pulmonary/Chest: Effort normal and breath sounds normal. No respiratory distress.  Abdominal: Soft. Bowel sounds are normal. She exhibits no distension. There is no tenderness.  Musculoskeletal: Normal range of motion. She exhibits no edema and no tenderness.  Lymphadenopathy:    She has no cervical adenopathy.  Neurological: She is alert and oriented to person, place, and time.  Skin: Skin is warm and dry. No rash  noted. No erythema. No pallor.  Psychiatric: Her behavior is normal.    ED Course  Procedures (including critical care time)  Labs Reviewed  CBC - Abnormal; Notable for the following:    HCT 35.9 (*)    All other components within normal limits  GLUCOSE, CAPILLARY - Abnormal; Notable for the following:    Glucose-Capillary 311 (*)    All other components within normal limits  POCT CBG MONITORING  COMPREHENSIVE METABOLIC PANEL  URINALYSIS, ROUTINE W REFLEX MICROSCOPIC  PREGNANCY, URINE  URINE RAPID DRUG SCREEN (HOSP PERFORMED)   No results found.   No diagnosis found.   MDM Return for worsening symptoms.  Follow up with PMD.  Patient verbalizes understanding        Rachel Haws K Ajaya Crutchfield-Rasch, MD 01/10/11 [phone]

## 2011-01-10 NOTE — ED Notes (Signed)
Pt states she has not been eating well. Sugar been high for a few days.

## 2011-01-10 NOTE — ED Notes (Signed)
Pt reports she has had elevated cbg readings this week tonight pt states she is "seeing things" and last gbg was in the 400 range

## 2011-01-11 LAB — CBC
HCT: 36.2
Hemoglobin: 12.2
MCV: 87
RBC: 4.16
WBC: 8.6

## 2011-01-11 LAB — BASIC METABOLIC PANEL
Chloride: 102
GFR calc Af Amer: >60
Potassium: 3.9

## 2011-01-15 ENCOUNTER — Ambulatory Visit (INDEPENDENT_AMBULATORY_CARE_PROVIDER_SITE_OTHER): Payer: Medicare Other | Admitting: Internal Medicine

## 2011-01-15 ENCOUNTER — Other Ambulatory Visit: Payer: Self-pay | Admitting: Internal Medicine

## 2011-01-15 ENCOUNTER — Encounter: Payer: Self-pay | Admitting: Internal Medicine

## 2011-01-15 VITALS — BP 134/80 | HR 104 | Temp 99.4°F | Ht 64.0 in | Wt 196.0 lb

## 2011-01-15 DIAGNOSIS — D649 Anemia, unspecified: Secondary | ICD-10-CM

## 2011-01-15 DIAGNOSIS — E119 Type 2 diabetes mellitus without complications: Secondary | ICD-10-CM

## 2011-01-15 DIAGNOSIS — R51 Headache: Secondary | ICD-10-CM

## 2011-01-15 DIAGNOSIS — J019 Acute sinusitis, unspecified: Secondary | ICD-10-CM

## 2011-01-15 LAB — FERRITIN: Ferritin: 19 ng/mL (ref 10–291)

## 2011-01-15 MED ORDER — KETOROLAC TROMETHAMINE 15 MG/ML IJ SOLN
15.0000 mg | Freq: Once | INTRAMUSCULAR | Status: AC
Start: 2011-01-15 — End: 2011-01-15
  Administered 2011-01-15: 15 mg via INTRAMUSCULAR

## 2011-01-15 MED ORDER — KETOROLAC TROMETHAMINE 15 MG/ML IJ SOLN: 15.0000 mg | Freq: Once | INTRAMUSCULAR | Status: DC | PRN

## 2011-01-15 MED ORDER — AZITHROMYCIN 250 MG PO TABS: ORAL_TABLET | ORAL | Status: AC

## 2011-01-15 MED ORDER — INSULIN GLARGINE 100 UNIT/ML ~~LOC~~ SOLN: SUBCUTANEOUS | Status: DC

## 2011-01-15 NOTE — Progress Notes (Signed)
Subjective:    Patient ID: [name], female    DOB: 1965/07/02, 45 y.o.   MRN: [id number]  HPI 1. Sinus headache and congestion for 7 days. Getting worse. Denies any fever, chills or sweats. White, thick discahrge in samll amounts, History of sinus infections per patient. Patient took Motrin 800 mg PO daily and "did not help at all." Patient did not try decongestants "because they drive her sugar up." states that even mucinex increases his BG. Patient is a smoker. 2. DM. Started on Actos 3 weeks ago. Checks CBG's 2-3 daily and still running in 202 -280 range. Patient reports hyperglycemia for 1 month when was placed on a Medrol dose pack for bronchitis.   Review of Systems  Constitutional: Positive for chills and malaise/fatigue. Negative for fever and diaphoresis.  HENT: Positive for congestion and sore throat. Negative for hearing loss, ear pain, nosebleeds, neck pain, tinnitus and ear discharge.   Eyes: Negative for blurred vision, double vision, photophobia, pain, discharge and redness.  Respiratory: Positive for cough. Negative for hemoptysis, sputum production, shortness of breath, wheezing and stridor.   Cardiovascular: Negative.   Gastrointestinal: Negative for heartburn, nausea, vomiting, abdominal pain, diarrhea, constipation, blood in stool and melena.  Genitourinary: Negative for dysuria, urgency, frequency, hematuria and flank pain.  Musculoskeletal: Negative for myalgias, back pain, joint pain and falls.  Skin: Negative for itching and rash.  Neurological: Positive for weakness and headaches. Negative for dizziness, tingling, tremors, sensory change, speech change, focal weakness, seizures and loss of consciousness.  Hematological: Negative for environmental allergies and polydipsia. Does not bruise/bleed easily.  Psychiatric/Behavioral: Negative.    Review of Systems  Constitutional: Positive for chills and malaise/fatigue. Negative for fever and diaphoresis.  HENT:  Positive for congestion and sore throat. Negative for hearing loss, ear pain, nosebleeds, neck pain, tinnitus and ear discharge.   Eyes: Negative for blurred vision, double vision, photophobia, pain, discharge and redness.  Respiratory: Positive for cough. Negative for hemoptysis, sputum production, shortness of breath, wheezing and stridor.   Cardiovascular: Negative.   Gastrointestinal: Negative for heartburn, nausea, vomiting, abdominal pain, diarrhea, constipation, blood in stool and melena.  Genitourinary: Negative for dysuria, urgency, frequency, hematuria and flank pain.  Musculoskeletal: Negative for myalgias, back pain, joint pain and falls.  Skin: Negative for itching and rash.  Neurological: Positive for weakness and headaches. Negative for dizziness, tingling, tremors, sensory change, speech change, focal weakness, seizures and loss of consciousness.  Endo/Heme/Allergies: Negative for environmental allergies and polydipsia. Does not bruise/bleed easily.  Psychiatric/Behavioral: Negative.        Objective:   Physical Exam Vitals: noted General: alert, well-developed, and cooperative to examination.  Head: normocephalic and atraumatic.  Eyes: vision grossly intact, pupils equal, pupils round, pupils reactive to light, no injection and anicteric.  Mouth: pharynx pink and moist, mild erythema, and no exudates; postnasal drip noted.Maxilalry sinus TTP bilaterally. Thick, green nasal discharge noted.  Neck: supple, full ROM, no thyromegaly, no JVD, and no carotid bruits.  Lungs: normal respiratory effort, no accessory muscle use, normal breath sounds, no crackles, and no wheezes. Heart: normal rate, regular rhythm, no murmur, no gallop, and no rub.  Abdomen: soft, non-tender, normal bowel sounds, no distention, no guarding, no rebound tenderness, no hepatomegaly, and no splenomegaly.  Msk: no joint swelling, no joint warmth, and no redness over joints.  Pulses: 2+ DP/PT pulses  bilaterally Extremities: No cyanosis, clubbing, edema Neurologic: alert & oriented X3, cranial nerves II-XII intact, strength normal in all extremities,  sensation intact to light touch, and gait normal.  Skin: turgor normal and no rashes.  Psych: Oriented X3, memory intact for recent and remote, normally interactive, good eye contact, not anxious appearing, and not depressed appearing.          Assessment & Plan:  1. Sinusitis, acute. Zithromycin x 5 days; nasal irrigations with saline; QUIT SMOKING! 2. DM, uncontrolled. Will d/ C Actos. Start Solostar lantus 8 Units SQ qhs; use taught and patient demonstrated proper use as well. Instructed to call with any concerns or questions. Signs and symptoms of hypoglycemia reviewed with the patient. Patient is to return in 1-2 weeks for  A f/u.

## 2011-01-15 NOTE — Progress Notes (Signed)
Toradol 15 mg ordered and given but vial used is Ketorolac 30 mg/ml so only 0.5cc = 15 mg given as ordered by Dr. Denton Meek. Trixie Dredge, 01/15/2011, 4:05 PM

## 2011-01-15 NOTE — Patient Instructions (Signed)
Please, stop taking Actos Take Insulin as prescribed and call with any questions. Please, check blood sugar in the mornings before breakfast. Call with any concerns. Drink plenty of water and rest. Please, follow up in 2 weeks or sooner if needed.

## 2011-01-16 ENCOUNTER — Encounter: Payer: Self-pay | Admitting: Internal Medicine

## 2011-01-16 LAB — RETICULOCYTES
ABS Retic: 93.7 10*3/uL (ref 19.0–186.0)
RBC.: 4.46 MIL/uL (ref 3.87–5.11)

## 2011-01-16 LAB — PATHOLOGIST SMEAR REVIEW

## 2011-01-24 ENCOUNTER — Ambulatory Visit (INDEPENDENT_AMBULATORY_CARE_PROVIDER_SITE_OTHER): Payer: Medicare Other | Admitting: Internal Medicine

## 2011-01-24 ENCOUNTER — Encounter: Payer: Self-pay | Admitting: Internal Medicine

## 2011-01-24 ENCOUNTER — Encounter: Payer: Medicare Other | Admitting: Internal Medicine

## 2011-01-24 ENCOUNTER — Other Ambulatory Visit: Payer: Self-pay | Admitting: Internal Medicine

## 2011-01-24 DIAGNOSIS — I10 Essential (primary) hypertension: Secondary | ICD-10-CM

## 2011-01-24 DIAGNOSIS — E876 Hypokalemia: Secondary | ICD-10-CM

## 2011-01-24 DIAGNOSIS — E119 Type 2 diabetes mellitus without complications: Secondary | ICD-10-CM

## 2011-01-24 LAB — GLUCOSE, CAPILLARY: Glucose-Capillary: 225 mg/dL — ABNORMAL HIGH (ref 70–99)

## 2011-01-24 MED ORDER — INSULIN GLARGINE 100 UNIT/ML ~~LOC~~ SOLN: SUBCUTANEOUS | Status: DC

## 2011-01-24 NOTE — Patient Instructions (Addendum)
1. Check your blood glucose every morning before breakfast. 2. Potassium was stopped. Will check blood work at the next office visit. 3. Make an appointment with Norm Parcel.

## 2011-01-24 NOTE — Progress Notes (Deleted)
  Subjective:    Patient ID: [name], female    DOB: April 01, 1966, 45 y.o.   MRN: [id number]  HPI    Review of Systems     Objective:   Physical Exam        Assessment & Plan:

## 2011-01-24 NOTE — Progress Notes (Signed)
Subjective:   Patient ID: [name] female   DOB: 06/12/1965 45 y.o.   MRN: [id number]  HPI: [name] is a 45 y.o. with PMH significant as outlined below who presented to the clinic for a regular follow up.  Was started on Lantus 8 units daily but  CBG in the morning are between 170-220 mean around 255 Patient noted that she has been fine otherwise.    Past Medical History  Diagnosis Date  . ASCVD (arteriosclerotic cardiovascular disease)     Minimal at cath in 07;negative pharm.stress nuclear study in 8/08 with nl EF; neg stress echo in 10  . DM (diabetes mellitus) 2000    onset/no insulin  . Hyperlipidemia   . HTN (hypertension) `    during treatment with Geodon  . GERD (gastroesophageal reflux disease)   . Anemia     Iron deficiency  . Alcohol abuse   . Depression   . Community acquired pneumonia 01/03/10    with pleural effusion-[facility] acute resp failure  . Obesity   . Diastolic dysfunction   . Pruritic rash    Current Outpatient Prescriptions  Medication Sig Dispense Refill  . ASSURE LANCETS MISC 1 application by Does not apply route 3 (three) times daily.  100 each  12  . cetirizine (ZYRTEC) 10 MG tablet Take 1 tablet (10 mg total) by mouth daily.  1 tablet  4  . ferrous gluconate (FERGON) 325 MG tablet Take 325 mg by mouth daily with breakfast.        . fluticasone (FLONASE) 50 MCG/ACT nasal spray Place 2 sprays into the nose daily.  16 g  2  . glucose blood (CHOICE DM FORA G20 TEST STRIPS) test strip Use as instructed  100 each  12  . glucose monitoring kit (FREESTYLE) monitoring kit 1 each by Does not apply route as needed for other. Please check blood sugar three times a day.  1 each  0  . hydrochlorothiazide 25 MG tablet Take 25 mg by mouth daily.        . insulin glargine (LANTUS) 100 UNIT/ML injection Solostar Pen Injext 8 units SQ qhs  3 mL  11  . lovastatin (MEVACOR) 20 MG tablet Take 20 mg by mouth at bedtime.        . Melatonin 5  MG TABS Take 1 tablet by mouth at bedtime as needed.        . metFORMIN (GLUCOPHAGE) 1000 MG tablet Take 1,000 mg by mouth 2 (two) times daily with a meal.        . metoprolol (LOPRESSOR) 50 MG tablet Take 50 mg by mouth 2 (two) times daily.        . nitroGLYCERIN (NITROSTAT) 0.4 MG SL tablet Place 0.4 mg under the tongue every 5 (five) minutes as needed.        Marland Kitchen omeprazole (PRILOSEC) 20 MG capsule Take 1 capsule (20 mg total) by mouth daily.  90 capsule  3  . potassium chloride SA (K-DUR,KLOR-CON) 20 MEQ tablet Take 20 mEq by mouth 2 (two) times daily.        . ziprasidone (GEODON) 40 MG capsule Take 80 mg by mouth 2 (two) times daily with a meal.       . ziprasidone (GEODON) 80 MG capsule One tab po q AM.  2 po QHS  126 capsule  0   Review of Systems: Constitutional: Denies fever, chills, diaphoresis, appetite change and fatigue.  Respiratory: Denies SOB, DOE,  cough, chest tightness,  and wheezing.   Cardiovascular: Denies chest pain, palpitations and leg swelling.  Gastrointestinal: Denies nausea, vomiting, abdominal pain, diarrhea, constipation, blood in stool and abdominal distention.  Genitourinary: Denies dysuria, urgency, frequency, hematuria, flank pain and difficulty urinating.  Skin: Denies pallor, rash and wound.  Neurological: Denies dizziness, seizures, syncope, weakness, light-headedness, numbness and headaches.    Objective:  Physical Exam: Filed Vitals:   01/24/11 0848  BP: 121/80  Pulse: 88  Temp: 98 F (36.7 C)  TempSrc: Oral  Height: 5\' 4"  (1.626 m)  Weight: 196 lb 9.6 oz (89.177 kg)   Constitutional: Vital signs reviewed.  Patient is a well-developed and well-nourished  in no acute distress and cooperative with exam. Alert and oriented x3.  Neck: Supple, Trachea midline normal ROM, No JVD, mass, thyromegaly, or carotid bruit present.  Cardiovascular: RRR, S1 normal, S2 normal, no MRG, pulses symmetric and intact bilaterally Pulmonary/Chest: CTAB, no wheezes,  rales, or rhonchi Abdominal: Soft. Non-tender, non-distended, bowel sounds are normal, no masses, organomegaly, or guarding present.  Musculoskeletal: No joint deformities, erythema, or stiffness, ROM full and no nontender Neurological: A&O x3,  no focal motor deficit, sensory intact to light touch bilaterally.

## 2011-01-28 NOTE — Assessment & Plan Note (Signed)
Will stop potassium and should recheck bmet.

## 2011-01-28 NOTE — Assessment & Plan Note (Signed)
Blood pressure well controlled on current regimen. BP Readings from Last 3 Encounters:  01/24/11 121/80  01/15/11 134/80  01/10/11 132/80

## 2011-01-28 NOTE — Assessment & Plan Note (Signed)
Will increase lantus to 13 units .Informed the patient that she needs to check CBG every morning before breakfast. I further refer the patient to to [facility] for Diet education.

## 2011-02-05 ENCOUNTER — Other Ambulatory Visit: Payer: Self-pay | Admitting: Obstetrics & Gynecology

## 2011-02-05 ENCOUNTER — Other Ambulatory Visit (HOSPITAL_COMMUNITY)
Admission: RE | Admit: 2011-02-05 | Discharge: 2011-02-05 | Disposition: A | Discharge status: Home / Self Care | Payer: Medicare Other | Source: Ambulatory Visit | Attending: Obstetrics & Gynecology | Admitting: Obstetrics & Gynecology

## 2011-02-05 DIAGNOSIS — Z124 Encounter for screening for malignant neoplasm of cervix: Secondary | ICD-10-CM | POA: Insufficient documentation

## 2011-02-07 ENCOUNTER — Ambulatory Visit (INDEPENDENT_AMBULATORY_CARE_PROVIDER_SITE_OTHER): Payer: Medicare Other | Admitting: Dietician

## 2011-02-07 DIAGNOSIS — E119 Type 2 diabetes mellitus without complications: Secondary | ICD-10-CM

## 2011-02-07 NOTE — Progress Notes (Signed)
Medical Nutrition Therapy:  Appt start time: 0830 end time:  0930.  Assessment:  Primary concerns today: Blood sugar control and Meal planning Usual eating pattern includes Meal 3 and 1+ snacks per day.    Avoided foods include: soda, sweetened cereal and juice.    Usual physical activity includes not assessed today  Progress Towards Goal(s):  In progress   Nutritional Diagnosis:  NB-1.1 Food and nutrition-related knowledge deficit As related to lack of prior exposure to diabetes training.  As evidenced by patient report, questions and concerns.   Interventions:  1- Education regarding administration and action of current insulin. 2- Education about carb counting and importance of consistent carb intake. 3- Education about Dentist and how to interpret numbers.  4- Coordination of care- recommend patient increase insulin by 1-2 units each day or 2 days until fasting reaches her goal of 100-120 mg/dl.    Monitoring/Evaluation:  Dietary intake and meter download in 4 week(s)

## 2011-02-07 NOTE — Patient Instructions (Addendum)
Keep needle off insulin pen.  Try to spread carbs out evenly over day.  See you in a month.   [name]  [phone]

## 2011-02-08 ENCOUNTER — Ambulatory Visit: Payer: Medicare Other | Admitting: Dietician

## 2011-02-08 ENCOUNTER — Encounter: Payer: Medicare Other | Admitting: Internal Medicine

## 2011-02-14 ENCOUNTER — Telehealth: Payer: Self-pay | Admitting: *Deleted

## 2011-02-14 ENCOUNTER — Encounter: Payer: Medicare Other | Admitting: Internal Medicine
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
## 2011-02-15 NOTE — Telephone Encounter (Signed)
High risk patient. Needs to be evaluated.

## 2011-02-18 ENCOUNTER — Emergency Department (HOSPITAL_COMMUNITY)
Admission: EM | Admit: 2011-02-18 | Discharge: 2011-02-18 | Disposition: A | Discharge status: Home / Self Care | Payer: Medicare Other | Attending: Emergency Medicine | Admitting: Emergency Medicine

## 2011-02-18 ENCOUNTER — Telehealth: Payer: Self-pay | Admitting: Internal Medicine

## 2011-02-18 ENCOUNTER — Encounter (HOSPITAL_COMMUNITY): Payer: Self-pay

## 2011-02-18 ENCOUNTER — Inpatient Hospital Stay (HOSPITAL_COMMUNITY): Admit: 2011-02-18 | Discharge: 2011-02-18 | Payer: Self-pay

## 2011-02-18 DIAGNOSIS — I2510 Atherosclerotic heart disease of native coronary artery without angina pectoris: Secondary | ICD-10-CM | POA: Insufficient documentation

## 2011-02-18 DIAGNOSIS — E119 Type 2 diabetes mellitus without complications: Secondary | ICD-10-CM | POA: Insufficient documentation

## 2011-02-18 DIAGNOSIS — E785 Hyperlipidemia, unspecified: Secondary | ICD-10-CM | POA: Insufficient documentation

## 2011-02-18 DIAGNOSIS — I10 Essential (primary) hypertension: Secondary | ICD-10-CM | POA: Insufficient documentation

## 2011-02-18 DIAGNOSIS — F1010 Alcohol abuse, uncomplicated: Secondary | ICD-10-CM | POA: Insufficient documentation

## 2011-02-18 DIAGNOSIS — F17200 Nicotine dependence, unspecified, uncomplicated: Secondary | ICD-10-CM | POA: Insufficient documentation

## 2011-02-18 DIAGNOSIS — K219 Gastro-esophageal reflux disease without esophagitis: Secondary | ICD-10-CM | POA: Insufficient documentation

## 2011-02-18 DIAGNOSIS — F329 Major depressive disorder, single episode, unspecified: Secondary | ICD-10-CM | POA: Insufficient documentation

## 2011-02-18 DIAGNOSIS — F419 Anxiety disorder, unspecified: Secondary | ICD-10-CM

## 2011-02-18 DIAGNOSIS — F3289 Other specified depressive episodes: Secondary | ICD-10-CM | POA: Insufficient documentation

## 2011-02-18 DIAGNOSIS — Z794 Long term (current) use of insulin: Secondary | ICD-10-CM | POA: Insufficient documentation

## 2011-02-18 DIAGNOSIS — F411 Generalized anxiety disorder: Secondary | ICD-10-CM | POA: Insufficient documentation

## 2011-02-18 MED ORDER — LORAZEPAM 0.5 MG PO TABS: ORAL_TABLET | ORAL | Status: DC
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
## 2011-02-18 NOTE — ED Notes (Signed)
Pt presents with "anxiety attack". Pt states she has experienced a stressful event and started having anxiety since Monday. Pt also c/o dizziness. Pt states her mood in fluctuating from happy to sad and back again.
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
## 2011-02-19 NOTE — Progress Notes (Signed)
Assessment Note   [name] is an 45 y.o. female.   Axis I: Major Depression, Rec  Past Medical History:  Past Medical History  Diagnosis Date  . ASCVD (arteriosclerotic cardiovascular disease)     Minimal at cath in 07;negative pharm.stress nuclear study in 8/08 with nl EF; neg stress echo in 10  . DM (diabetes mellitus) 2000    onset/no insulin  . Hyperlipidemia   . HTN (hypertension) `    during treatment with Geodon  . GERD (gastroesophageal reflux disease)   . Anemia     Iron deficiency  . Alcohol abuse   . Depression   . Community acquired pneumonia 01/03/10    with pleural effusion-[facility] acute resp failure  . Obesity   . Diastolic dysfunction   . Pruritic rash   . Chest pain     Past Surgical History  Procedure Date  . Dilation and curettage, diagnostic / therapeutic     Family History:  Family History  Problem Relation Age of Onset  . Colon cancer Neg Hx     Social History:  reports that she has been smoking Cigarettes.  She has been smoking about 2 packs per day. She does not have any smokeless tobacco history on file. She reports that she does not drink alcohol or use illicit drugs.  Allergies:  Allergies  Allergen Reactions  . Penicillins Rash    Fever as well  . Sulfonamide Derivatives Rash    fever  . Glipizide Other (See Comments)    psychosis  . Metronidazole     REACTION: swelling  . Sulfamethoxazole W/Trimethoprim     Home Medications:  Medications Prior to Admission  Medication Sig Dispense Refill  . ASSURE LANCETS MISC 1 application by Does not apply route 3 (three) times daily.  100 each  12  . cetirizine (ZYRTEC) 10 MG tablet Take 1 tablet (10 mg total) by mouth daily.  1 tablet  4  . ferrous gluconate (FERGON) 325 MG tablet Take 325 mg by mouth daily with breakfast.        . fluticasone (FLONASE) 50 MCG/ACT nasal spray Place 2 sprays into the nose daily.  16 g  2  . glucose blood (CHOICE DM FORA G20 TEST STRIPS)  test strip Use as instructed  100 each  12  . glucose monitoring kit (FREESTYLE) monitoring kit 1 each by Does not apply route as needed for other. Please check blood sugar three times a day.  1 each  0  . hydrochlorothiazide 25 MG tablet Take 25 mg by mouth daily.        . insulin glargine (LANTUS) 100 UNIT/ML injection Inject 14 Units into the skin at bedtime.        . lovastatin (MEVACOR) 20 MG tablet Take 20 mg by mouth at bedtime.        . Melatonin 5 MG TABS Take 1 tablet by mouth at bedtime as needed.        . metFORMIN (GLUCOPHAGE) 1000 MG tablet Take 1,000 mg by mouth 2 (two) times daily with a meal.        . metoprolol (LOPRESSOR) 50 MG tablet Take 50 mg by mouth 2 (two) times daily.        . nitroGLYCERIN (NITROSTAT) 0.4 MG SL tablet Place 0.4 mg under the tongue every 5 (five) minutes as needed.        Marland Kitchen omeprazole (PRILOSEC) 20 MG capsule Take 1 capsule (20 mg total) by  mouth daily.  90 capsule  3  . ziprasidone (GEODON) 40 MG capsule Take 40 mg by mouth every morning.        No current facility-administered medications on file as of 02/18/2011.    OB/GYN Status:  Patient's last menstrual period was 02/08/2011.  General Assessment Data Assessment Number: 1  Living Arrangements: Alone Can pt return to current living arrangement?: Yes Admission Status: Voluntary Is patient capable of signing voluntary admission?: Yes Transfer from: Home Referral Source: Self/Family/Friend  Risk to self Suicidal Ideation: No Suicidal Intent: No Is patient at risk for suicide?: No Suicidal Plan?: No Access to Means: No Triggers for Past Attempts: None known Intentional Self Injurious Behavior: None Factors that decrease suicide risk: Absense of psychosis Family Suicide History: No Persecutory voices/beliefs?: No Depression: Yes Depression Symptoms: Loss of interest in usual pleasures Substance abuse history and/or treatment for substance abuse?: No Suicide prevention information given  to non-admitted patients: Yes  Risk to Others Homicidal Ideation: No Thoughts of Harm to Others: No Current Homicidal Intent: No Current Homicidal Plan: No Access to Homicidal Means: No History of harm to others?: No Assessment of Violence: None Noted Does patient have access to weapons?: No Criminal Charges Pending?: No Does patient have a court date: No  Mental Status Report Appear/Hygiene: Improved Eye Contact: Good Motor Activity: Freedom of movement Speech: Logical/coherent Level of Consciousness: Alert Mood: Depressed Affect: Depressed Anxiety Level: Minimal Thought Processes: Coherent Judgement: Unimpaired Orientation: Person;Place;Time;Situation Obsessive Compulsive Thoughts/Behaviors: None  Cognitive Functioning Concentration: Normal Memory: Recent Intact IQ: Average Insight: Good Impulse Control: Good Appetite: Good Sleep: No Change Vegetative Symptoms: None  Prior Inpatient/Outpatient Therapy Prior Therapy: Inpatient Prior Therapy Dates: 1/*19/2012 [facility] Prior Therapy Facilty/Provider(s): [facility]-OUT PATIENT Reason for Treatment: DEPRESSION                     Additional Information 1:1 In Past 12 Months?: No CIRT Risk: No Elopement Risk: No Does patient have medical clearance?: Yes     Disposition:  Disposition Disposition of Patient: Referred to Patient referred to: Other (Comment) [facility])  On Site Evaluation by:   Reviewed with Physician:     Hattie Perch Winford 02/19/2011 8:59 AM
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
## 2011-02-20 ENCOUNTER — Ambulatory Visit (INDEPENDENT_AMBULATORY_CARE_PROVIDER_SITE_OTHER): Payer: Medicare Other | Admitting: Internal Medicine

## 2011-02-20 ENCOUNTER — Encounter: Payer: Self-pay | Admitting: Internal Medicine

## 2011-02-20 ENCOUNTER — Other Ambulatory Visit: Payer: Self-pay | Admitting: Internal Medicine

## 2011-02-20 VITALS — BP 120/85 | HR 95 | Temp 97.7°F | Resp 20 | Ht 64.0 in | Wt 189.1 lb

## 2011-02-20 DIAGNOSIS — F411 Generalized anxiety disorder: Secondary | ICD-10-CM

## 2011-02-20 DIAGNOSIS — E119 Type 2 diabetes mellitus without complications: Secondary | ICD-10-CM

## 2011-02-20 DIAGNOSIS — N949 Unspecified condition associated with female genital organs and menstrual cycle: Secondary | ICD-10-CM

## 2011-02-20 DIAGNOSIS — F419 Anxiety disorder, unspecified: Secondary | ICD-10-CM

## 2011-02-20 DIAGNOSIS — F329 Major depressive disorder, single episode, unspecified: Secondary | ICD-10-CM

## 2011-02-20 DIAGNOSIS — D509 Iron deficiency anemia, unspecified: Secondary | ICD-10-CM

## 2011-02-20 DIAGNOSIS — N898 Other specified noninflammatory disorders of vagina: Secondary | ICD-10-CM | POA: Insufficient documentation

## 2011-02-20 MED ORDER — FERROUS GLUCONATE 325 MG PO TABS: 325.0000 mg | ORAL_TABLET | Freq: Three times a day (TID) | ORAL | Status: DC

## 2011-02-20 MED ORDER — LORAZEPAM 0.5 MG PO TABS: ORAL_TABLET | ORAL | Status: DC
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
## 2011-02-20 NOTE — Patient Instructions (Signed)

## 2011-02-20 NOTE — Assessment & Plan Note (Signed)
Patient is currently being managed by a psychiatrist and we do not have access to his records.  She is currently on Geodon. I will give her a refill of ativan at this time and asked the front desk to make a appointment with PCP. Social work referral today to help with resources in the community to seek immediate help. No SI/HI at this time.

## 2011-02-20 NOTE — Assessment & Plan Note (Signed)
No changes in meds today. I have asked her to come back in 2 weeks for a follow up.

## 2011-02-21 ENCOUNTER — Other Ambulatory Visit: Payer: Self-pay | Admitting: Internal Medicine

## 2011-02-21 ENCOUNTER — Telehealth: Payer: Self-pay | Admitting: Licensed Clinical Social Worker

## 2011-02-21 ENCOUNTER — Telehealth: Payer: Self-pay | Admitting: *Deleted

## 2011-02-21 LAB — WET PREP BY MOLECULAR PROBE
Gardnerella vaginalis: NEGATIVE
Trichomonas vaginosis: NEGATIVE

## 2011-02-21 LAB — GC/CHLAMYDIA PROBE AMP, GENITAL: Chlamydia, DNA Probe: NEGATIVE

## 2011-02-21 MED ORDER — FERROUS GLUCONATE IRON 246 (28 FE) MG PO TABS: 246.0000 mg | ORAL_TABLET | Freq: Three times a day (TID) | ORAL | Status: DC

## 2011-02-21 NOTE — Telephone Encounter (Signed)
Ferrous gluconate 240 should be ok. I will change that in the med list.

## 2011-02-21 NOTE — Telephone Encounter (Signed)
Call the pharmacy. They do not have Ferrous Gluconate 325mg ; but have Ferrous Gluconate 240mg  or Ferrous Sulfate 325mg . Please advise  Thanks
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
## 2011-03-07 ENCOUNTER — Other Ambulatory Visit (HOSPITAL_COMMUNITY): Payer: Medicare Other

## 2011-03-12 ENCOUNTER — Ambulatory Visit: Payer: Medicare Other | Admitting: Dietician

## 2011-03-14 ENCOUNTER — Ambulatory Visit: Admit: 2011-03-14 | Payer: Self-pay | Admitting: Obstetrics & Gynecology

## 2011-03-14 SURGERY — DILATATION & CURETTAGE/HYSTEROSCOPY WITH THERMACHOICE ABLATION
Anesthesia: General

## 2011-03-19 ENCOUNTER — Encounter: Payer: Medicare Other | Admitting: Advanced Practice Midwife

## 2011-03-20 NOTE — Progress Notes (Signed)
Addended by: Dorie Rank E on: 03/20/2011 01:59 PM   Modules accepted: Orders

## 2011-03-27 ENCOUNTER — Emergency Department (HOSPITAL_COMMUNITY)
Admission: EM | Admit: 2011-03-27 | Discharge: 2011-03-28 | Disposition: A | Discharge status: Home / Self Care | Payer: Medicare Other | Attending: Emergency Medicine | Admitting: Emergency Medicine

## 2011-03-27 ENCOUNTER — Encounter (HOSPITAL_COMMUNITY): Payer: Self-pay | Admitting: *Deleted

## 2011-03-27 DIAGNOSIS — I2510 Atherosclerotic heart disease of native coronary artery without angina pectoris: Secondary | ICD-10-CM | POA: Insufficient documentation

## 2011-03-27 DIAGNOSIS — Z794 Long term (current) use of insulin: Secondary | ICD-10-CM | POA: Insufficient documentation

## 2011-03-27 DIAGNOSIS — E785 Hyperlipidemia, unspecified: Secondary | ICD-10-CM | POA: Insufficient documentation

## 2011-03-27 DIAGNOSIS — K529 Noninfective gastroenteritis and colitis, unspecified: Secondary | ICD-10-CM

## 2011-03-27 DIAGNOSIS — R197 Diarrhea, unspecified: Secondary | ICD-10-CM | POA: Insufficient documentation

## 2011-03-27 DIAGNOSIS — I10 Essential (primary) hypertension: Secondary | ICD-10-CM | POA: Insufficient documentation

## 2011-03-27 DIAGNOSIS — J069 Acute upper respiratory infection, unspecified: Secondary | ICD-10-CM | POA: Insufficient documentation

## 2011-03-27 DIAGNOSIS — E669 Obesity, unspecified: Secondary | ICD-10-CM | POA: Insufficient documentation

## 2011-03-27 DIAGNOSIS — J3489 Other specified disorders of nose and nasal sinuses: Secondary | ICD-10-CM | POA: Insufficient documentation

## 2011-03-27 DIAGNOSIS — K5289 Other specified noninfective gastroenteritis and colitis: Secondary | ICD-10-CM | POA: Insufficient documentation

## 2011-03-27 DIAGNOSIS — K920 Hematemesis: Secondary | ICD-10-CM | POA: Insufficient documentation

## 2011-03-27 DIAGNOSIS — E119 Type 2 diabetes mellitus without complications: Secondary | ICD-10-CM | POA: Insufficient documentation

## 2011-03-27 DIAGNOSIS — IMO0001 Reserved for inherently not codable concepts without codable children: Secondary | ICD-10-CM | POA: Insufficient documentation

## 2011-03-27 DIAGNOSIS — K219 Gastro-esophageal reflux disease without esophagitis: Secondary | ICD-10-CM | POA: Insufficient documentation

## 2011-03-27 NOTE — ED Notes (Signed)
Pt reports nausea and vomiting starting about 2 hours ago.  Reports some SOB x 4 days.  Pt reports that she has vomited twice, reports emesis was dark red.

## 2011-03-28 ENCOUNTER — Emergency Department (HOSPITAL_COMMUNITY): Payer: Medicare Other

## 2011-03-28 ENCOUNTER — Encounter (HOSPITAL_COMMUNITY): Payer: Self-pay | Admitting: Emergency Medicine

## 2011-03-28 LAB — BASIC METABOLIC PANEL
Chloride: 98 mEq/L (ref 96–112)
GFR calc Af Amer: >90 mL/min (ref >90)
GFR calc non Af Amer: >90 mL/min (ref >90)
Glucose, Bld: 219 mg/dL — ABNORMAL HIGH (ref 70–99)
Potassium: 3.2 mEq/L — ABNORMAL LOW (ref 3.5–5.1)
Sodium: 134 mEq/L — ABNORMAL LOW (ref 135–145)

## 2011-03-28 LAB — CBC
Hemoglobin: 13.1 g/dL (ref 12.0–15.0)
MCHC: 33.9 g/dL (ref 30.0–36.0)
WBC: 9.6 10*3/uL (ref 4.0–10.5)

## 2011-03-28 MED ORDER — ONDANSETRON HCL 4 MG/2ML IJ SOLN
4.0000 mg | Freq: Once | INTRAMUSCULAR | Status: AC
  Administered 2011-03-28: 4 mg via INTRAVENOUS
  Filled 2011-03-28: qty 2

## 2011-03-28 MED ORDER — NAPROXEN 500 MG PO TABS: 500.0000 mg | ORAL_TABLET | Freq: Two times a day (BID) | ORAL | Status: DC

## 2011-03-28 MED ORDER — ALBUTEROL SULFATE HFA 108 (90 BASE) MCG/ACT IN AERS
2.0000 | INHALATION_SPRAY | Freq: Four times a day (QID) | RESPIRATORY_TRACT | Status: DC
  Administered 2011-03-28: 2 via RESPIRATORY_TRACT
  Filled 2011-03-28: qty 6.7

## 2011-03-28 MED ORDER — PROMETHAZINE HCL 25 MG PO TABS: 25.0000 mg | ORAL_TABLET | Freq: Four times a day (QID) | ORAL | Status: DC | PRN

## 2011-03-28 MED ORDER — SODIUM CHLORIDE 0.9 % IV SOLN: INTRAVENOUS | Status: DC

## 2011-03-28 MED ORDER — ONDANSETRON 8 MG PO TBDP: 8.0000 mg | ORAL_TABLET | Freq: Three times a day (TID) | ORAL | Status: AC | PRN

## 2011-03-28 MED ORDER — FAMOTIDINE 20 MG PO TABS: 10.0000 mg | ORAL_TABLET | Freq: Two times a day (BID) | ORAL | Status: DC

## 2011-03-28 MED ORDER — SODIUM CHLORIDE 0.9 % IV BOLUS (SEPSIS)
1000.0000 mL | Freq: Once | INTRAVENOUS | Status: AC
  Administered 2011-03-28: 1000 mL via INTRAVENOUS

## 2011-03-28 MED ORDER — LOPERAMIDE HCL 2 MG PO CAPS: 2.0000 mg | ORAL_CAPSULE | Freq: Four times a day (QID) | ORAL | Status: AC | PRN

## 2011-03-28 MED ORDER — PANTOPRAZOLE SODIUM 40 MG IV SOLR
40.0000 mg | Freq: Once | INTRAVENOUS | Status: AC
  Administered 2011-03-28: 40 mg via INTRAVENOUS
  Filled 2011-03-28: qty 40

## 2011-03-28 NOTE — ED Provider Notes (Signed)
History     CSN: [id number] Arrival date & time: 03/27/2011 11:25 PM   First MD Initiated Contact with Patient 03/27/11 2353      Chief Complaint  Patient presents with  . Hematemesis    (Consider location/radiation/quality/duration/timing/severity/associated sxs/prior treatment) The history is provided by the patient.   patient is a 45 year old female followed by outpatient clinics down to [place] patient reports not nausea and vomiting started 2 hours ago she's been feeling short of breath for 4 days the patient reported that she vomited twice of the emesis had specks of dark blood in it is also had diarrhea all day. Shows approximately 10 vomiting as stated above 2 episodes nausea vomiting started at the 8:00 this evening. He also has some nasal congestion. Denies fever, significant abdominal pain, admits to myalgias.  Past Medical History  Diagnosis Date  . ASCVD (arteriosclerotic cardiovascular disease)     Minimal at cath in 07;negative pharm.stress nuclear study in 8/08 with nl EF; neg stress echo in 10  . DM (diabetes mellitus) 2000    onset/no insulin  . Hyperlipidemia   . HTN (hypertension) `    during treatment with Geodon  . GERD (gastroesophageal reflux disease)   . Anemia     Iron deficiency  . Alcohol abuse   . Depression   . Community acquired pneumonia 01/03/10    with pleural effusion-[facility] acute resp failure  . Obesity   . Diastolic dysfunction   . Pruritic rash   . Chest pain     Past Surgical History  Procedure Date  . Dilation and curettage, diagnostic / therapeutic     Family History  Problem Relation Age of Onset  . Colon cancer Neg Hx     History  Substance Use Topics  . Smoking status: Current Everyday Smoker -- 2.0 packs/day    Types: Cigarettes  . Smokeless tobacco: Not on file   Comment: wants to stop but nerves too bad at present  . Alcohol Use: No    OB History    Grav Para Term Preterm Abortions TAB SAB Ect  Mult Living                  Review of Systems  Constitutional: Negative for fever.  HENT: Positive for congestion. Negative for neck pain.   Eyes: Negative for visual disturbance.  Respiratory: Positive for shortness of breath. Negative for cough.   Cardiovascular: Negative for chest pain.  Gastrointestinal: Positive for nausea, vomiting and diarrhea. Negative for abdominal pain.  Genitourinary: Negative for dysuria and hematuria.  Musculoskeletal: Positive for myalgias. Negative for back pain.  Skin: Negative for rash.  Neurological: Negative for headaches.  Psychiatric/Behavioral: Negative for confusion.    Allergies  Penicillins; Sulfonamide derivatives; Glipizide; Metronidazole; Orange; Shrimp; and Sulfamethoxazole w/trimethoprim  Home Medications   Current Outpatient Rx  Name Route Sig Dispense Refill  . ASSURE LANCETS MISC Does not apply 1 application by Does not apply route 3 (three) times daily. 100 each 12  . CETIRIZINE HCL 10 MG PO TABS Oral Take 1 tablet (10 mg total) by mouth daily. 1 tablet 4  . DIPHENHYDRAMINE HCL 25 MG PO TABS Oral Take 25-50 mg by mouth daily as needed. Sleep/allergies     . FERROUS GLUCONATE IRON 246 (28 FE) MG PO TABS Oral Take 246 mg by mouth 2 (two) times daily.      Marland Kitchen FLUTICASONE PROPIONATE 50 MCG/ACT NA SUSP Nasal Place 2 sprays into the nose daily.  16 g 2  . GLUCOSE BLOOD VI STRP  Use as instructed 100 each 12  . HYDROCHLOROTHIAZIDE 25 MG PO TABS Oral Take 25 mg by mouth daily.      . INSULIN GLARGINE 100 UNIT/ML [place] SOLN Subcutaneous Inject 20 Units into the skin at bedtime.     Marland Kitchen LORAZEPAM 0.5 MG PO TABS  ONE HALF to 1 tab PO BID prn anxiety 60 tablet 0  . LOVASTATIN 20 MG PO TABS Oral Take 20 mg by mouth at bedtime.      Marland Kitchen METFORMIN HCL 1000 MG PO TABS Oral Take 1,000 mg by mouth 2 (two) times daily with a meal.      . METOPROLOL TARTRATE 50 MG PO TABS Oral Take 50 mg by mouth 2 (two) times daily.      Marland Kitchen OMEPRAZOLE 20 MG PO CPDR Oral  Take 1 capsule (20 mg total) by mouth daily. 90 capsule 3  . POTASSIUM CHLORIDE CRYS CR 20 MEQ PO TBCR Oral Take 20 mEq by mouth 2 (two) times daily.      Marland Kitchen ZIPRASIDONE HCL 40 MG PO CAPS Oral Take 40 mg by mouth every morning.     Marland Kitchen ZIPRASIDONE HCL 60 MG PO CAPS Oral Take 60 mg by mouth at bedtime.      Marland Kitchen FAMOTIDINE 20 MG PO TABS Oral Take 0.5 tablets (10 mg total) by mouth 2 (two) times daily. 14 tablet 0  . LOPERAMIDE HCL 2 MG PO CAPS Oral Take 1 capsule (2 mg total) by mouth 4 (four) times daily as needed for diarrhea or loose stools. 12 capsule 0  . NAPROXEN 500 MG PO TABS Oral Take 1 tablet (500 mg total) by mouth 2 (two) times daily. 14 tablet 0  . NITROGLYCERIN 0.4 MG SL SUBL Sublingual Place 0.4 mg under the tongue every 5 (five) minutes as needed.      Marland Kitchen ONDANSETRON 8 MG PO TBDP Oral Take 1 tablet (8 mg total) by mouth every 8 (eight) hours as needed for nausea. 10 tablet 0  . PROMETHAZINE HCL 25 MG PO TABS Oral Take 1 tablet (25 mg total) by mouth every 6 (six) hours as needed for nausea. 12 tablet 0    BP 122/81  Pulse 79  Temp(Src) 98.9 F (37.2 C) (Oral)  Resp 16  Ht 5\' 4"  (1.626 m)  Wt 180 lb (81.647 kg)  BMI 30.90 kg/m2  SpO2 99%  LMP 03/07/2011  Physical Exam  Nursing note and vitals reviewed. Constitutional: She is oriented to person, place, and time. She appears well-developed and well-nourished.  HENT:  Head: Normocephalic and atraumatic.  Mouth/Throat: Oropharynx is clear and moist.  Eyes: Conjunctivae and EOM are normal. Pupils are equal, round, and reactive to light.  Neck: Normal range of motion. Neck supple.  Cardiovascular: Normal rate, regular rhythm and normal heart sounds.   No murmur heard. Pulmonary/Chest: Effort normal and breath sounds normal. No respiratory distress. She has no wheezes.  Abdominal: Soft. Bowel sounds are normal. There is no tenderness.  Musculoskeletal: Normal range of motion.  Lymphadenopathy:    She has no cervical adenopathy.    Neurological: She is alert and oriented to person, place, and time. No cranial nerve deficit. She exhibits normal muscle tone. Coordination normal.  Skin: Skin is warm. No rash noted.    ED Course  Procedures (including critical care time)  Labs Reviewed  BASIC METABOLIC PANEL - Abnormal; Notable for the following:    Sodium 134 (*)  Potassium 3.2 (*)    Glucose, Bld 219 (*)    All other components within normal limits  CBC   Dg Chest 2 View  03/28/2011  *RADIOLOGY REPORT*  Clinical Data: Shortness of breath  CHEST - 2 VIEW  Comparison: 01/10/2011  Findings: The lungs are clear without focal consolidation, edema, effusion or pneumothorax.  Cardiopericardial silhouette is within normal limits for size.  Imaged bony structures of the thorax are intact.  IMPRESSION: Normal exam.  Original Report Authenticated By: ERIC A. MANSELL, M.D.   Results for orders placed during the hospital encounter of 03/27/11  CBC      Component Value Range   WBC 9.6  4.0 - 10.5 (K/uL)   RBC 4.51  3.87 - 5.11 (MIL/uL)   Hemoglobin 13.1  12.0 - 15.0 (g/dL)   HCT 16.1  09.6 - 04.5 (%)   MCV 85.8  78.0 - 100.0 (fL)   MCH 29.0  26.0 - 34.0 (pg)   MCHC 33.9  30.0 - 36.0 (g/dL)   RDW 40.9  81.1 - 91.4 (%)   Platelets 198  150 - 400 (K/uL)  BASIC METABOLIC PANEL      Component Value Range   Sodium 134 (*) 135 - 145 (mEq/L)   Potassium 3.2 (*) 3.5 - 5.1 (mEq/L)   Chloride 98  96 - 112 (mEq/L)   CO2 28  19 - 32 (mEq/L)   Glucose, Bld 219 (*) 70 - 99 (mg/dL)   BUN 12  6 - 23 (mg/dL)   Creatinine, Ser 7.82  0.50 - 1.10 (mg/dL)   Calcium 9.8  8.4 - 95.6 (mg/dL)   GFR calc non Af Amer >90  >90 (mL/min)   GFR calc Af Amer >90  >90 (mL/min)     1. Gastroenteritis   2. Upper respiratory infection       MDM   Patient's nausea and vomiting suggestive of gastroenteritis viral illness, provided history of vomiting specks of blood x2, blood was dark red. White blood cell count and hemoglobin hematocrit  and normal in the emergency department. No further vomiting. Denies black or blood in stools. Patient also with a some congestion and feeling short of breath lungs were clear chest x-ray was negative oxygen saturations were normal, all saturation readings were in the upper 90s patient without fever. It is possible that patient is helping of upper rest for infection or flulike illness or influenza however she did have her flu shot. Improve some in the emergency probably antinausea medicine. Will discharge home with albuterol inhaler 2 puffs for a week Pepcid Phenergan Zofran and Imodium. Patient also had IV fluids. At discharge alert nontoixic.        Shelda Jakes, MD 03/28/11 [phone]

## 2011-03-28 NOTE — ED Notes (Signed)
Summoned to patient room by call light. Patient states she feels like it is hard to breathe. NAD at this time. O2 saturation 100%. Respirations - 16, pulse - 81.

## 2011-04-04 ENCOUNTER — Encounter: Payer: Self-pay | Admitting: Internal Medicine

## 2011-04-04 ENCOUNTER — Ambulatory Visit (INDEPENDENT_AMBULATORY_CARE_PROVIDER_SITE_OTHER): Payer: Medicare Other | Admitting: Internal Medicine

## 2011-04-04 VITALS — BP 108/74 | HR 86 | Temp 98.3°F | Resp 20 | Ht 65.0 in | Wt 195.6 lb

## 2011-04-04 DIAGNOSIS — E876 Hypokalemia: Secondary | ICD-10-CM

## 2011-04-04 DIAGNOSIS — E1143 Type 2 diabetes mellitus with diabetic autonomic (poly)neuropathy: Secondary | ICD-10-CM

## 2011-04-04 DIAGNOSIS — E1149 Type 2 diabetes mellitus with other diabetic neurological complication: Secondary | ICD-10-CM

## 2011-04-04 DIAGNOSIS — K3184 Gastroparesis: Secondary | ICD-10-CM

## 2011-04-04 DIAGNOSIS — E119 Type 2 diabetes mellitus without complications: Secondary | ICD-10-CM

## 2011-04-04 LAB — BASIC METABOLIC PANEL
CO2: 21 mEq/L (ref 19–32)
Calcium: 9.5 mg/dL (ref 8.4–10.5)
Chloride: 103 mEq/L (ref 96–112)
Creat: 0.9 mg/dL (ref 0.50–1.10)
Glucose, Bld: 194 mg/dL — ABNORMAL HIGH (ref 70–99)
Potassium: 4.3 mEq/L (ref 3.5–5.3)
Sodium: 136 mEq/L (ref 135–145)

## 2011-04-04 LAB — GLUCOSE, CAPILLARY: Glucose-Capillary: 231 mg/dL — ABNORMAL HIGH (ref 70–99)

## 2011-04-04 MED ORDER — INSULIN GLARGINE 100 UNIT/ML ~~LOC~~ SOLN: 25.0000 [IU] | Freq: Every day | SUBCUTANEOUS | Status: DC

## 2011-04-04 MED ORDER — METOCLOPRAMIDE HCL 10 MG PO TABS: 10.0000 mg | ORAL_TABLET | Freq: Three times a day (TID) | ORAL | Status: DC

## 2011-04-04 NOTE — Assessment & Plan Note (Signed)
Her last basic metabolic panel patient had hypokalemia which was mild, will recheck basic metabolic panel today to ensure resolution

## 2011-04-04 NOTE — Progress Notes (Signed)
Subjective:     Patient ID: [name], female   DOB: July 12, 1965, 45 y.o.   MRN: [id number]  HPI  Patient is a 45 year old female with a past medical history listed below, presents to the outpatient clinic for routine followup for diabetes, reports compliance with her medication, patient also complains of bowel slowing, increased GERD despite compliance with proton pump inhibitor and feeling of bloating and fullness. Has been ongoing for the past several months. Denies any other complaints  Patient Active Problem List  Diagnoses  . DIABETES MELLITUS, TYPE II  . HYPERLIPIDEMIA  . OBESITY  . History of alcohol abuse  . TOBACCO ABUSE  . DEPRESSION  . HYPERTENSION  . DIASTOLIC DYSFUNCTION  . HEMORRHOIDS, INTERNAL  . CHRONIC RHINITIS  . GASTROESOPHAGEAL REFLUX DISEASE  . MELENA  . BARTHOLIN'S CYST  . MENORRHAGIA  . DELAYED MENSES  . PAIN IN JOINT, MULTIPLE SITES  . BACK PAIN  . INSOMNIA  . RECTAL BLEEDING, HX OF  . Seasonal allergies  . Vaginal odor  . Gastroparesis due to DM  . Hypokalemia   Current Outpatient Prescriptions on File Prior to Visit  Medication Sig Dispense Refill  . ASSURE LANCETS MISC 1 application by Does not apply route 3 (three) times daily.  100 each  12  . cetirizine (ZYRTEC) 10 MG tablet Take 1 tablet (10 mg total) by mouth daily.  1 tablet  4  . diphenhydrAMINE (BENADRYL) 25 MG tablet Take 25-50 mg by mouth daily as needed. Sleep/allergies       . famotidine (PEPCID) 20 MG tablet Take 0.5 tablets (10 mg total) by mouth 2 (two) times daily.  14 tablet  0  . ferrous gluconate (FERGON) 246 (28 FE) MG tablet Take 246 mg by mouth 2 (two) times daily.        . fluticasone (FLONASE) 50 MCG/ACT nasal spray Place 2 sprays into the nose daily.  16 g  2  . glucose blood (CHOICE DM FORA G20 TEST STRIPS) test strip Use as instructed  100 each  12  . hydrochlorothiazide 25 MG tablet Take 25 mg by mouth daily.        Marland Kitchen loperamide (IMODIUM) 2 MG capsule Take 1  capsule (2 mg total) by mouth 4 (four) times daily as needed for diarrhea or loose stools.  12 capsule  0  . LORazepam (ATIVAN) 0.5 MG tablet ONE HALF to 1 tab PO BID prn anxiety  60 tablet  0  . lovastatin (MEVACOR) 20 MG tablet Take 20 mg by mouth at bedtime.        . metFORMIN (GLUCOPHAGE) 1000 MG tablet Take 1,000 mg by mouth 2 (two) times daily with a meal.        . metoprolol (LOPRESSOR) 50 MG tablet Take 50 mg by mouth 2 (two) times daily.        . naproxen (NAPROSYN) 500 MG tablet Take 1 tablet (500 mg total) by mouth 2 (two) times daily.  14 tablet  0  . nitroGLYCERIN (NITROSTAT) 0.4 MG SL tablet Place 0.4 mg under the tongue every 5 (five) minutes as needed.        Marland Kitchen omeprazole (PRILOSEC) 20 MG capsule Take 1 capsule (20 mg total) by mouth daily.  90 capsule  3  . ondansetron (ZOFRAN ODT) 8 MG disintegrating tablet Take 1 tablet (8 mg total) by mouth every 8 (eight) hours as needed for nausea.  10 tablet  0  . potassium chloride SA (K-DUR,KLOR-CON) 20 MEQ  tablet Take 20 mEq by mouth 2 (two) times daily.        . promethazine (PHENERGAN) 25 MG tablet Take 1 tablet (25 mg total) by mouth every 6 (six) hours as needed for nausea.  12 tablet  0  . ziprasidone (GEODON) 40 MG capsule Take 40 mg by mouth every morning.       . ziprasidone (GEODON) 60 MG capsule Take 60 mg by mouth at bedtime.        Marland Kitchen DISCONTD: insulin glargine (LANTUS) 100 UNIT/ML injection Inject 20 Units into the skin at bedtime.        Allergies  Allergen Reactions  . Penicillins Rash    Fever as well  . Sulfonamide Derivatives Rash    fever  . Glipizide Other (See Comments)    psychosis  . Metronidazole     REACTION: swelling  . Orange   . Shrimp (Shellfish Allergy)   . Sulfamethoxazole W/Trimethoprim      Review of Systems  All other systems reviewed and are negative.       Objective:   Physical Exam  Nursing note and vitals reviewed. Constitutional: She is oriented to person, place, and time. She  appears well-developed and well-nourished.  HENT:  Head: Normocephalic and atraumatic.  Eyes: Pupils are equal, round, and reactive to light.  Neck: Normal range of motion. Neck supple. No JVD present. No thyromegaly present.  Cardiovascular: Normal rate, regular rhythm and normal heart sounds.   No murmur heard. Pulmonary/Chest: Effort normal and breath sounds normal. She has no wheezes. She has no rales.  Abdominal: Soft. Bowel sounds are normal.  Musculoskeletal: Normal range of motion. She exhibits no edema.  Neurological: She is alert and oriented to person, place, and time.  Skin: Skin is warm and dry.

## 2011-04-04 NOTE — Assessment & Plan Note (Signed)
Patient complains of symptoms of bloating and slowing of her bowels consistent with gastroparesis, we'll empirically treat with metoclopramide and reassess in 3 months

## 2011-04-04 NOTE — Patient Instructions (Signed)
Please increase her Lantus to 25 units at bedtime Please take new medication metoclopramide 3 times a day before meals for your stomach

## 2011-04-04 NOTE — Assessment & Plan Note (Signed)
Improving, A1c of 8.4, increase Lantus to 25 units at bedtime and recheck A1c in 3 months

## 2011-04-06 ENCOUNTER — Emergency Department (HOSPITAL_COMMUNITY)
Admission: EM | Admit: 2011-04-06 | Discharge: 2011-04-06 | Disposition: A | Discharge status: Home / Self Care | Payer: Medicare Other | Attending: Emergency Medicine | Admitting: Emergency Medicine

## 2011-04-06 ENCOUNTER — Emergency Department (HOSPITAL_COMMUNITY): Payer: Medicare Other

## 2011-04-06 ENCOUNTER — Other Ambulatory Visit: Payer: Self-pay | Admitting: Internal Medicine

## 2011-04-06 ENCOUNTER — Encounter (HOSPITAL_COMMUNITY): Payer: Self-pay | Admitting: Emergency Medicine

## 2011-04-06 DIAGNOSIS — R0602 Shortness of breath: Secondary | ICD-10-CM | POA: Insufficient documentation

## 2011-04-06 DIAGNOSIS — K3184 Gastroparesis: Secondary | ICD-10-CM | POA: Insufficient documentation

## 2011-04-06 DIAGNOSIS — E1143 Type 2 diabetes mellitus with diabetic autonomic (poly)neuropathy: Secondary | ICD-10-CM

## 2011-04-06 DIAGNOSIS — Z139 Encounter for screening, unspecified: Secondary | ICD-10-CM

## 2011-04-06 DIAGNOSIS — K219 Gastro-esophageal reflux disease without esophagitis: Secondary | ICD-10-CM

## 2011-04-06 DIAGNOSIS — R110 Nausea: Secondary | ICD-10-CM | POA: Insufficient documentation

## 2011-04-06 DIAGNOSIS — IMO0001 Reserved for inherently not codable concepts without codable children: Secondary | ICD-10-CM

## 2011-04-06 DIAGNOSIS — E1149 Type 2 diabetes mellitus with other diabetic neurological complication: Secondary | ICD-10-CM | POA: Insufficient documentation

## 2011-04-06 LAB — COMPREHENSIVE METABOLIC PANEL
ALT: 14 U/L (ref 0–35)
AST: 21 U/L (ref 0–37)
CO2: 22 mEq/L (ref 19–32)
Calcium: 9.6 mg/dL (ref 8.4–10.5)
Chloride: 99 mEq/L (ref 96–112)
GFR calc non Af Amer: >90 mL/min (ref >90)
Potassium: 5.1 mEq/L (ref 3.5–5.1)
Sodium: 131 mEq/L — ABNORMAL LOW (ref 135–145)
Total Bilirubin: 0.3 mg/dL (ref 0.3–1.2)

## 2011-04-06 LAB — URINALYSIS, ROUTINE W REFLEX MICROSCOPIC
Glucose, UA: NEGATIVE mg/dL
Leukocytes, UA: NEGATIVE
Specific Gravity, Urine: <1.005 — ABNORMAL LOW (ref 1.005–1.030)
pH: 5.5 (ref 5.0–8.0)

## 2011-04-06 LAB — CBC
Hemoglobin: 13.7 g/dL (ref 12.0–15.0)
RBC: 4.78 MIL/uL (ref 3.87–5.11)

## 2011-04-06 LAB — GLUCOSE, CAPILLARY: Glucose-Capillary: 165 mg/dL — ABNORMAL HIGH (ref 70–99)

## 2011-04-06 LAB — URINE MICROSCOPIC-ADD ON

## 2011-04-06 MED ORDER — SODIUM CHLORIDE 0.9 % IV SOLN
8.0000 mg | Freq: Once | INTRAVENOUS | Status: AC
  Administered 2011-04-06: 8 mg via INTRAVENOUS
  Filled 2011-04-06: qty 4

## 2011-04-06 MED ORDER — ONDANSETRON HCL 8 MG PO TABS: 8.0000 mg | ORAL_TABLET | Freq: Three times a day (TID) | ORAL | Status: AC | PRN

## 2011-04-06 MED ORDER — OMEPRAZOLE 40 MG PO CPDR: 40.0000 mg | DELAYED_RELEASE_CAPSULE | Freq: Two times a day (BID) | ORAL | Status: DC

## 2011-04-06 MED ORDER — PROMETHAZINE HCL 25 MG PO TABS: 25.0000 mg | ORAL_TABLET | Freq: Four times a day (QID) | ORAL | Status: DC | PRN

## 2011-04-06 MED ORDER — SODIUM CHLORIDE 0.9 % IV SOLN
1000.0000 mL | Freq: Once | INTRAVENOUS | Status: AC
  Administered 2011-04-06: 1000 mL via INTRAVENOUS

## 2011-04-06 NOTE — ED Provider Notes (Signed)
History     CSN: [id number]  Arrival date & time 04/06/11  0718   First MD Initiated Contact with Patient 04/06/11 [phone]      Chief Complaint  Patient presents with  . Nausea  . Shortness of Breath  . Emesis   HPI Pt is a 45 year old female with diabetes, ?gastroparesis?, and history of EtOH abuse who presents with nausea/vomiting, small volume emesis, and a feeling of shortness of breath that has been present for the past two days.  The patient has been having problems with nausea and small volume emesis flecked with blood for about the last month.  She has been given the presumptive diagnosis of diabetic gastroparesis and, about a week ago, was started on reglan.  She had also been taking antiemetics from a prior ED visit up until the past several days.  She reports that, beginning two days ago, she started having a feeling that she couldn't breath because her stomach contents were coming up into there throat.  This has been keeping her from sleeping.  She doesn't report wheezing or tachypnia, no cough/hemoptysis.  She does report some abdominal distension along with some loose stools.  No edema or urinary symptoms.  Past Medical History  Diagnosis Date  . ASCVD (arteriosclerotic cardiovascular disease)     Minimal at cath in 07;negative pharm.stress nuclear study in 8/08 with nl EF; neg stress echo in 10  . DM (diabetes mellitus) 2000    onset/no insulin  . Hyperlipidemia   . HTN (hypertension) `    during treatment with Geodon  . GERD (gastroesophageal reflux disease)   . Anemia     Iron deficiency  . Alcohol abuse   . Depression   . Community acquired pneumonia 01/03/10    with pleural effusion-[facility] acute resp failure  . Obesity   . Diastolic dysfunction   . Pruritic rash   . Chest pain     Past Surgical History  Procedure Date  . Dilation and curettage, diagnostic / therapeutic     Family History  Problem Relation Age of Onset  . Colon cancer Neg Hx      History  Substance Use Topics  . Smoking status: Current Everyday Smoker -- 1.5 packs/day    Types: Cigarettes  . Smokeless tobacco: Not on file   Comment: wants to stop but nerves too bad at present - offered QUIT line info  . Alcohol Use: No    OB History    Grav Para Term Preterm Abortions TAB SAB Ect Mult Living                  Review of Systems  Constitutional: Negative.   HENT: Negative.   Eyes: Negative.   Respiratory: Positive for chest tightness and shortness of breath. Negative for cough, choking and wheezing.   Cardiovascular: Negative.   Gastrointestinal: Positive for nausea, vomiting, diarrhea and abdominal distention. Negative for abdominal pain, constipation and blood in stool.  Genitourinary: Positive for vaginal bleeding. Negative for urgency, frequency, flank pain, enuresis, difficulty urinating and pelvic pain.  Musculoskeletal: Negative.   Skin: Negative.   Neurological: Negative.   Hematological: Negative.     Allergies  Orange; Shrimp; Penicillins; Sulfonamide derivatives; Glipizide; Metronidazole; and Sulfamethoxazole w/trimethoprim  Home Medications   Current Outpatient Rx  Name Route Sig Dispense Refill  . CETIRIZINE HCL 10 MG PO TABS Oral Take 1 tablet (10 mg total) by mouth daily. 1 tablet 4  . DIPHENHYDRAMINE HCL 25  MG PO TABS Oral Take 25-50 mg by mouth daily as needed. Sleep/allergies     . FERROUS GLUCONATE IRON 246 (28 FE) MG PO TABS Oral Take 246 mg by mouth 2 (two) times daily.      Marland Kitchen FLUTICASONE PROPIONATE 50 MCG/ACT NA SUSP Nasal Place 2 sprays into the nose daily. 16 g 2  . HYDROCHLOROTHIAZIDE 25 MG PO TABS Oral Take 25 mg by mouth daily.      . IBUPROFEN 200 MG PO TABS Oral Take 400 mg by mouth every 6 (six) hours as needed. For pain     . INSULIN GLARGINE 100 UNIT/ML [place] SOLN Subcutaneous Inject 25 Units into the skin at bedtime. 10 mL 11  . LOPERAMIDE HCL 2 MG PO CAPS Oral Take 1 capsule (2 mg total) by mouth 4 (four) times  daily as needed for diarrhea or loose stools. 12 capsule 0  . LORAZEPAM 0.5 MG PO TABS Oral Take 0.5-1 mg by mouth 2 (two) times daily as needed. For anxiety     . LOVASTATIN 20 MG PO TABS Oral Take 20 mg by mouth at bedtime.      Marland Kitchen METFORMIN HCL 1000 MG PO TABS Oral Take 1,000 mg by mouth 2 (two) times daily with a meal.      . METOCLOPRAMIDE HCL 10 MG PO TABS Oral Take 1 tablet (10 mg total) by mouth 3 (three) times daily with meals. 90 tablet 1  . METOPROLOL TARTRATE 50 MG PO TABS Oral Take 50 mg by mouth 2 (two) times daily.      Marland Kitchen NAPROXEN 500 MG PO TABS Oral Take 500 mg by mouth 2 (two) times daily as needed. For pain     . POTASSIUM CHLORIDE CRYS CR 20 MEQ PO TBCR Oral Take 20 mEq by mouth 2 (two) times daily.      Marland Kitchen ZIPRASIDONE HCL 40 MG PO CAPS Oral Take 40 mg by mouth every morning.     Marland Kitchen ZIPRASIDONE HCL 60 MG PO CAPS Oral Take 60 mg by mouth at bedtime.      Sharia Reeve LANCETS MISC Does not apply 1 application by Does not apply route 3 (three) times daily. 100 each 12  . GLUCOSE BLOOD VI STRP  Use as instructed 100 each 12  . NITROGLYCERIN 0.4 MG SL SUBL Sublingual Place 0.4 mg under the tongue every 5 (five) minutes as needed. For chest pains    . OMEPRAZOLE 40 MG PO CPDR Oral Take 1 capsule (40 mg total) by mouth 2 (two) times daily. 60 capsule 0  . ONDANSETRON HCL 8 MG PO TABS Oral Take 1 tablet (8 mg total) by mouth every 8 (eight) hours as needed for nausea. 30 tablet 0  . PROMETHAZINE HCL 25 MG PO TABS Oral Take 1 tablet (25 mg total) by mouth every 6 (six) hours as needed for nausea. 30 tablet 0    BP 119/73  Pulse 75  Temp(Src) 98.6 F (37 C) (Oral)  Resp 16  Ht 5\' 4"  (1.626 m)  Wt 180 lb (81.647 kg)  BMI 30.90 kg/m2  SpO2 98%  LMP 03/30/2011  Physical Exam  Constitutional: She is oriented to person, place, and time. She appears well-developed and well-nourished. No distress.  HENT:  Head: Normocephalic and atraumatic.  Right Ear: External ear normal.  Left Ear:  External ear normal.  Nose: Nose normal.  Mouth/Throat: Oropharynx is clear and moist.  Eyes: Conjunctivae and EOM are normal. Pupils are equal, round, and reactive  to light.  Neck: Normal range of motion. Neck supple.  Cardiovascular: Normal rate, regular rhythm, normal heart sounds and intact distal pulses.   No murmur heard. Pulmonary/Chest: Effort normal and breath sounds normal.  Abdominal: Soft. Bowel sounds are normal. She exhibits no distension and no mass. There is no tenderness. There is no rebound and no guarding.  Musculoskeletal: Normal range of motion. She exhibits no edema.  Lymphadenopathy:    She has no cervical adenopathy.  Neurological: She is alert and oriented to person, place, and time. No cranial nerve deficit.  Skin: Skin is warm and dry.    ED Course  Procedures (including critical care time)  Labs Reviewed  GLUCOSE, CAPILLARY - Abnormal; Notable for the following:    Glucose-Capillary 165 (*)    All other components within normal limits  URINALYSIS, ROUTINE W REFLEX MICROSCOPIC - Abnormal; Notable for the following:    Specific Gravity, Urine <1.005 (*)    Hgb urine dipstick MODERATE (*)    All other components within normal limits  COMPREHENSIVE METABOLIC PANEL - Abnormal; Notable for the following:    Sodium 131 (*)    Glucose, Bld 144 (*)    All other components within normal limits  CBC  D-DIMER, QUANTITATIVE  PROTIME-INR  URINE MICROSCOPIC-ADD ON  POCT CBG MONITORING   Dg Abd Acute W/chest  04/06/2011  *RADIOLOGY REPORT*  Clinical Data: Abdominal distention.  Shortness of breath.  ACUTE ABDOMEN SERIES (ABDOMEN 2 VIEW & CHEST 1 VIEW)  Comparison: None.  Findings: Bowel gas pattern does not show evidence of ileus, obstruction nor free air.  The patient has a moderate amount of fecal matter and gas in the colon.  No significant bony findings. No abnormal soft tissue calcifications.  One-view chest shows normal heart and mediastinal shadows.  Lungs  are clear.  No effusions.  No free air.  IMPRESSION: No acute finding.  Moderate amount of stool and gas throughout the colon.  Original Report Authenticated By: Thomasenia Sales, M.D.     1. Gastroparesis due to DM   2. Reflux   3. GERD (gastroesophageal reflux disease)       MDM  Pt feeling much better s/p 1L NS and 4mg  IV zofran.  Feel that her anti-nausea meds (which she ran out of a few days ago) were probably contributing more to her symptom relief that she was expecting.  Will DC home with new prescriptions and instructions to f/u with PCP in ~1 week.        Majel Homer, MD 04/06/11 [phone]

## 2011-04-06 NOTE — ED Notes (Signed)
Pt c/o n/v and sob x a couple of days.

## 2011-04-07 NOTE — ED Provider Notes (Signed)
I saw and evaluated the patient, reviewed the resident's note and I agree with the findings and plan.   .Face to face Exam:  General:  Awake HEENT:  Atraumatic Resp:  Normal effort Abd:  Nondistended Neuro:No focal weakness Lymph: No adenopathy   Nelia Shi, MD 04/07/11 2226

## 2011-04-12 ENCOUNTER — Ambulatory Visit (HOSPITAL_COMMUNITY)
Admission: RE | Admit: 2011-04-12 | Discharge: 2011-04-12 | Disposition: A | Discharge status: Home / Self Care | Payer: Medicare Other | Source: Ambulatory Visit | Attending: Obstetrics and Gynecology | Admitting: Obstetrics and Gynecology

## 2011-04-12 DIAGNOSIS — Z1231 Encounter for screening mammogram for malignant neoplasm of breast: Secondary | ICD-10-CM | POA: Insufficient documentation

## 2011-04-12 DIAGNOSIS — Z139 Encounter for screening, unspecified: Secondary | ICD-10-CM

## 2011-04-19 ENCOUNTER — Encounter: Payer: Self-pay | Admitting: Cardiology

## 2011-04-19 ENCOUNTER — Ambulatory Visit: Payer: Medicare Other | Admitting: Cardiology

## 2011-04-19 DIAGNOSIS — D509 Iron deficiency anemia, unspecified: Secondary | ICD-10-CM | POA: Insufficient documentation

## 2011-04-19 DIAGNOSIS — I2510 Atherosclerotic heart disease of native coronary artery without angina pectoris: Secondary | ICD-10-CM | POA: Insufficient documentation

## 2011-04-29 ENCOUNTER — Encounter (HOSPITAL_COMMUNITY): Payer: Self-pay

## 2011-04-29 ENCOUNTER — Emergency Department (HOSPITAL_COMMUNITY): Payer: Medicare Other

## 2011-04-29 ENCOUNTER — Emergency Department (HOSPITAL_COMMUNITY)
Admission: EM | Admit: 2011-04-29 | Discharge: 2011-04-30 | Disposition: A | Discharge status: Home / Self Care | Payer: Medicare Other | Attending: Emergency Medicine | Admitting: Emergency Medicine

## 2011-04-29 ENCOUNTER — Telehealth: Payer: Self-pay | Admitting: Internal Medicine

## 2011-04-29 ENCOUNTER — Other Ambulatory Visit: Payer: Self-pay

## 2011-04-29 DIAGNOSIS — E119 Type 2 diabetes mellitus without complications: Secondary | ICD-10-CM | POA: Insufficient documentation

## 2011-04-29 DIAGNOSIS — R5381 Other malaise: Secondary | ICD-10-CM | POA: Diagnosis not present

## 2011-04-29 DIAGNOSIS — F17200 Nicotine dependence, unspecified, uncomplicated: Secondary | ICD-10-CM | POA: Insufficient documentation

## 2011-04-29 DIAGNOSIS — E669 Obesity, unspecified: Secondary | ICD-10-CM | POA: Diagnosis not present

## 2011-04-29 DIAGNOSIS — Z794 Long term (current) use of insulin: Secondary | ICD-10-CM | POA: Insufficient documentation

## 2011-04-29 DIAGNOSIS — D509 Iron deficiency anemia, unspecified: Secondary | ICD-10-CM | POA: Insufficient documentation

## 2011-04-29 DIAGNOSIS — R110 Nausea: Secondary | ICD-10-CM | POA: Diagnosis not present

## 2011-04-29 DIAGNOSIS — E785 Hyperlipidemia, unspecified: Secondary | ICD-10-CM | POA: Diagnosis not present

## 2011-04-29 DIAGNOSIS — I10 Essential (primary) hypertension: Secondary | ICD-10-CM | POA: Insufficient documentation

## 2011-04-29 DIAGNOSIS — R079 Chest pain, unspecified: Secondary | ICD-10-CM | POA: Insufficient documentation

## 2011-04-29 DIAGNOSIS — I2510 Atherosclerotic heart disease of native coronary artery without angina pectoris: Secondary | ICD-10-CM | POA: Diagnosis not present

## 2011-04-29 DIAGNOSIS — K222 Esophageal obstruction: Secondary | ICD-10-CM | POA: Diagnosis not present

## 2011-04-29 DIAGNOSIS — R059 Cough, unspecified: Secondary | ICD-10-CM | POA: Insufficient documentation

## 2011-04-29 DIAGNOSIS — R0989 Other specified symptoms and signs involving the circulatory and respiratory systems: Secondary | ICD-10-CM | POA: Diagnosis not present

## 2011-04-29 DIAGNOSIS — K219 Gastro-esophageal reflux disease without esophagitis: Secondary | ICD-10-CM | POA: Diagnosis not present

## 2011-04-29 DIAGNOSIS — IMO0001 Reserved for inherently not codable concepts without codable children: Secondary | ICD-10-CM | POA: Insufficient documentation

## 2011-04-29 DIAGNOSIS — R5383 Other fatigue: Secondary | ICD-10-CM | POA: Diagnosis not present

## 2011-04-29 DIAGNOSIS — R062 Wheezing: Secondary | ICD-10-CM | POA: Diagnosis not present

## 2011-04-29 DIAGNOSIS — Z6831 Body mass index (BMI) 31.0-31.9, adult: Secondary | ICD-10-CM | POA: Diagnosis not present

## 2011-04-29 DIAGNOSIS — R05 Cough: Secondary | ICD-10-CM | POA: Insufficient documentation

## 2011-04-29 LAB — DIFFERENTIAL
Basophils Absolute: 0 10*3/uL (ref 0.0–0.1)
Basophils Relative: 0 % (ref 0–1)
Eosinophils Absolute: 0.2 10*3/uL (ref 0.0–0.7)
Eosinophils Relative: 3 % (ref 0–5)
Neutrophils Relative %: 56 % (ref 43–77)

## 2011-04-29 LAB — CBC
MCH: 29.4 pg (ref 26.0–34.0)
MCHC: 34.5 g/dL (ref 30.0–36.0)
Platelets: 208 10*3/uL (ref 150–400)
RBC: 4.32 MIL/uL (ref 3.87–5.11)
RDW: 15.3 % (ref 11.5–15.5)

## 2011-04-29 MED ORDER — ONDANSETRON HCL 4 MG/2ML IJ SOLN
4.0000 mg | Freq: Once | INTRAMUSCULAR | Status: AC
  Administered 2011-04-29: 4 mg via INTRAVENOUS
  Filled 2011-04-29: qty 2

## 2011-04-29 MED ORDER — GI COCKTAIL ~~LOC~~
30.0000 mL | Freq: Once | ORAL | Status: AC
  Administered 2011-04-29: 30 mL via ORAL
  Filled 2011-04-29: qty 30

## 2011-04-29 MED ORDER — SODIUM CHLORIDE 0.9 % IV BOLUS (SEPSIS)
1000.0000 mL | Freq: Once | INTRAVENOUS | Status: AC
  Administered 2011-04-29: 1000 mL via INTRAVENOUS

## 2011-04-29 NOTE — Telephone Encounter (Signed)
[facility] After-Hours Telephone Call   Reason for call:    I received a call from [name] indicating increased weakness for last 4 days causing her to have increased fatigue, increased lightheadedness (without resultant falls), she also experienced left sided chest pain at rest with associated shortness of breath and nausea overnight - lasting approximately 15 minutes, then self-resolving. She notably has been on her menstrual cycle x 1 week, and is just stopping her cycle today. She confirms menorrhagia. She is not taking her iron pills secondary to resultant to constipation that is refractory to colace. She also has not been taking her potassium supplementation because she ran out and did not get it refilled.    Background information:   Last clinic visit - April 04, 2011 - with Dr. Gilford Rile in the Clinic.   Last pertinent labs -   Basic Metabolic Panel:    Component Value Date/Time   NA 131* 04/06/2011 0755   K 5.1 04/06/2011 0755   CL 99 04/06/2011 0755   CO2 22 04/06/2011 0755   BUN 10 04/06/2011 0755   CREATININE 0.63 04/06/2011 0755   CREATININE 0.90 04/04/2011 1557   GLUCOSE 144* 04/06/2011 0755   CALCIUM 9.6 04/06/2011 0755   Last ferritin:  Lab Results  Component Value Date   FERRITIN 19 01/15/2011   Lab Results  Component Value Date   HGBA1C 8.4 04/04/2011    Recommendations given:    Summary: Given her constellation of symptoms, it is not clear the exact etiology of her current symptoms and multiple things could be contributing. Her differential diagnoses include (but are not limited to) 1. Acute blood loss anemia (especially given non-compliance with iron supplementation and that she just completed a heavy menstrual cycle) 2. Electrolyte derangements secondary to noncompliance with potassium supplmentation but continued use of her antihypertensives (particularly hctz) 3. Acute cardiac etiology must also be considered  given her risk factors of DMII, HTN 4. DMII - hypo or hyperglycemia - unknown if possibly contributing bc patient does not check her blood sugars at home bc she misplaced her meter.  This information was discussed with the patient, and she expresses understanding of the above.   Recommendation given: Patient is recommended to go to the ER for further evaluation,  call [phone] if cannot get a ride immediately. She needs to have further evaluation of her cardiac status, electrolytes, blood counts at the least.    Conclusion: Patient recommended to go to ER for evaluation, and states that she will go. Pt voices understanding of the importance of presenting to the ER immediately for evaluation and that delay in evaluation could result in worsening of anemia, cardiac ramifications, or even death.    Johnette Abraham, D.O.  04/29/2011, 8:48 PM

## 2011-04-29 NOTE — ED Notes (Signed)
Chest pain, hurting in left shoulder and back of head per pt. Weakness; think my iron is low, stopped taking iron pills due to constipation.

## 2011-04-29 NOTE — ED Provider Notes (Signed)
History     CSN: [id number]  Arrival date & time 04/29/11  2251   First MD Initiated Contact with Patient 04/29/11 2314      Chief Complaint  Patient presents with  . Chest Pain  . Weakness    (Consider location/radiation/quality/duration/timing/severity/associated sxs/prior treatment) Patient is a 46 y.o. female presenting with chest pain and weakness.  Chest Pain Pertinent negatives for primary symptoms include no vomiting.  Associated symptoms include weakness.    Weakness Primary symptoms do not include vomiting.  Additional symptoms include weakness.   The patient presents for the third time in one month with similar complaints. She notes that today she has chest pain, nausea. Her symptoms began one day ago, insidiously. Since onset, they have been intermittent. She notes that the chest pain is focally about the sternum and the left inframammary area. It is otherwise nonradiating, not worse with exertion or deep inspiration. She also c/o 4-5d of diffuse myalgia, worse in her shoulders.The pain is sharp, occur spontaneously, and the patient has not attempted anything for relief thus far. The patient's nausea is similar to that she has felt many times previously, has been intermittent, not occurring peri-prandially.  No abd pain, no emesis/diarrhea.  The patient is an insulin dependent diabetic.  She stopped taking iron and MVI this week, after she was constipated for some time (she denies constipation currently.) She also c/o 4-5 d of fatigue, mild cough. Past Medical History  Diagnosis Date  . Arteriosclerotic cardiovascular disease (ASCVD)     Minimal at cath in [facility].stress nuclear study in 8/08 with nl EF; neg stress echo in 2010  . Diabetes mellitus, type 2 2000    Onset in 2000; no insulin  . Hyperlipidemia   . Hypertension `    during treatment with Geodon  . Gastroesophageal reflux disease     Schatzki's ring  . Anemia, iron deficiency   . Alcohol  abuse   . Depression   . Community acquired pneumonia 01/03/10    2011; with pleural effusion-[facility] acute resp failure  . Obesity     Past Surgical History  Procedure Date  . Dilation and curettage, diagnostic / therapeutic 1992    Family History  Problem Relation Age of Onset  . Colon cancer Neg Hx   . Hypertension Mother   . Stroke Father   . Heart disease Sister     History  Substance Use Topics  . Smoking status: Current Everyday Smoker -- 1.0 packs/day for 30 years    Types: Cigarettes  . Smokeless tobacco: Not on file   Comment: wants to stop but nerves too bad at present - offered QUIT line info  . Alcohol Use: 0.0 oz/week     occasional    OB History    Grav Para Term Preterm Abortions TAB SAB Ect Mult Living                  Review of Systems  Constitutional:       HPI  HENT:       HPI otherwise negative  Eyes: Negative.   Respiratory:       HPI, otherwise negative  Cardiovascular: Positive for chest pain.       HPI, otherwise nmegative  Gastrointestinal: Negative for vomiting.  Genitourinary:       HPI, otherwise negative  Musculoskeletal:       HPI, otherwise negative  Skin: Negative.   Neurological: Positive for weakness. Negative for syncope.  Allergies  Orange; Shrimp; Penicillins; Sulfonamide derivatives; Glipizide; Metronidazole; and Sulfamethoxazole w/trimethoprim  Home Medications   Current Outpatient Rx  Name Route Sig Dispense Refill  . ASSURE LANCETS MISC Does not apply 1 application by Does not apply route 3 (three) times daily. 100 each 12  . CETIRIZINE HCL 10 MG PO TABS Oral Take 1 tablet (10 mg total) by mouth daily. 1 tablet 4  . DIPHENHYDRAMINE HCL 25 MG PO TABS Oral Take 25-50 mg by mouth daily as needed. Sleep/allergies     . FERROUS GLUCONATE IRON 246 (28 FE) MG PO TABS Oral Take 246 mg by mouth 2 (two) times daily.      Marland Kitchen FLUTICASONE PROPIONATE 50 MCG/ACT NA SUSP Nasal Place 2 sprays into the nose daily. 16 g  2  . GLUCOSE BLOOD VI STRP  Use as instructed 100 each 12  . HYDROCHLOROTHIAZIDE 25 MG PO TABS Oral Take 25 mg by mouth daily.      . IBUPROFEN 200 MG PO TABS Oral Take 400 mg by mouth every 6 (six) hours as needed. For pain     . INSULIN GLARGINE 100 UNIT/ML [place] SOLN Subcutaneous Inject 25 Units into the skin at bedtime. 10 mL 11  . LOVASTATIN 20 MG PO TABS Oral Take 20 mg by mouth at bedtime.      Marland Kitchen METFORMIN HCL 1000 MG PO TABS Oral Take 1,000 mg by mouth 2 (two) times daily with a meal.      . METOCLOPRAMIDE HCL 10 MG PO TABS Oral Take 1 tablet (10 mg total) by mouth 3 (three) times daily with meals. 90 tablet 1  . METOPROLOL TARTRATE 50 MG PO TABS Oral Take 50 mg by mouth 2 (two) times daily.      Marland Kitchen NAPROXEN 500 MG PO TABS Oral Take 500 mg by mouth 2 (two) times daily as needed. For pain     . OMEPRAZOLE 40 MG PO CPDR Oral Take 1 capsule (40 mg total) by mouth 2 (two) times daily. 60 capsule 0  . POTASSIUM CHLORIDE CRYS ER 20 MEQ PO TBCR Oral Take 20 mEq by mouth 2 (two) times daily.      Marland Kitchen ZIPRASIDONE HCL 40 MG PO CAPS Oral Take 40 mg by mouth every morning.     Marland Kitchen ZIPRASIDONE HCL 60 MG PO CAPS Oral Take 60 mg by mouth at bedtime.      Marland Kitchen LORAZEPAM 0.5 MG PO TABS Oral Take 0.5-1 mg by mouth 2 (two) times daily as needed. For anxiety     . NITROGLYCERIN 0.4 MG SL SUBL Sublingual Place 0.4 mg under the tongue every 5 (five) minutes as needed. For chest pains      BP 131/85  Pulse 95  Temp(Src) 98.4 F (36.9 C) (Oral)  Resp 16  Ht 5\' 4"  (1.626 m)  Wt 186 lb (84.369 kg)  BMI 31.93 kg/m2  SpO2 97%  LMP 04/20/2011  Physical Exam  Nursing note and vitals reviewed. Constitutional: She is oriented to person, place, and time. She appears well-developed and well-nourished. No distress.  HENT:  Head: Normocephalic and atraumatic.  Eyes: Conjunctivae and EOM are normal.  Cardiovascular: Normal rate and regular rhythm.   Pulmonary/Chest: Effort normal and breath sounds normal. No stridor. No  respiratory distress.  Abdominal: She exhibits no distension.  Musculoskeletal: She exhibits no edema.  Neurological: She is alert and oriented to person, place, and time. No cranial nerve deficit.  Skin: Skin is warm and dry.  Psychiatric: She  has a normal mood and affect.    ED Course  Procedures (including critical care time)   Labs Reviewed  CARDIAC PANEL(CRET KIN+CKTOT+MB+TROPI)  CBC  DIFFERENTIAL  COMPREHENSIVE METABOLIC PANEL  URINALYSIS, ROUTINE W REFLEX MICROSCOPIC  POCT PREGNANCY, URINE  LIPASE, BLOOD   No results found.   No diagnosis found.   Date: 04/29/2011  Rate: 82  Rhythm: normal sinus rhythm  QRS Axis: normal  Intervals: normal  ST/T Wave abnormalities: normal  Conduction Disutrbances:none  Narrative Interpretation:   Old EKG Reviewed: unchanged NORMAL ECG Pulse ox 99%ra-normal Cardiac monitor 81sr normal  X-ray, reviewed by me, no acute findings  MDM  This 46 year old female presents with several days of generalized discomfort, fatigue, and new left sided chest pain. On exam the patient is in no distress.the patient's laboratory evaluation is unremarkable/reassuring. Patient is on her menstrual cycle, explaining the hematuria. The patient's ECG and chest x-ray are further reassurance for this being very unlikely to 2 cardiac etiology.  Further reassurance taken from the patient's previous cardiac evaluations. Given the description of multiple days of generalized complaints, the patient's symptoms are most likely infectious in origin, some consideration of metabolic derangement. The patient has clinic followup available to her and will be discharged in stable condition with a refill of her antidepressant, as she requests.    Gerhard Munch, MD 04/30/11 [phone]

## 2011-04-30 DIAGNOSIS — R0989 Other specified symptoms and signs involving the circulatory and respiratory systems: Secondary | ICD-10-CM | POA: Diagnosis not present

## 2011-04-30 DIAGNOSIS — R062 Wheezing: Secondary | ICD-10-CM | POA: Diagnosis not present

## 2011-04-30 DIAGNOSIS — E119 Type 2 diabetes mellitus without complications: Secondary | ICD-10-CM | POA: Diagnosis not present

## 2011-04-30 LAB — COMPREHENSIVE METABOLIC PANEL
ALT: 12 U/L (ref 0–35)
Albumin: 3.3 g/dL — ABNORMAL LOW (ref 3.5–5.2)
Alkaline Phosphatase: 82 U/L (ref 39–117)
Calcium: 10.5 mg/dL (ref 8.4–10.5)
Potassium: 3.8 mEq/L (ref 3.5–5.1)
Sodium: 134 mEq/L — ABNORMAL LOW (ref 135–145)
Total Protein: 6.3 g/dL (ref 6.0–8.3)

## 2011-04-30 LAB — CARDIAC PANEL(CRET KIN+CKTOT+MB+TROPI)
CK, MB: 1.3 ng/mL (ref 0.3–4.0)
Total CK: 36 U/L (ref 7–177)
Troponin I: <0.3 ng/mL (ref <0.30)

## 2011-04-30 LAB — URINALYSIS, ROUTINE W REFLEX MICROSCOPIC
Bilirubin Urine: NEGATIVE
Glucose, UA: NEGATIVE mg/dL
Ketones, ur: NEGATIVE mg/dL
Leukocytes, UA: NEGATIVE
Specific Gravity, Urine: 1.01 (ref 1.005–1.030)
pH: 6.5 (ref 5.0–8.0)

## 2011-04-30 LAB — URINE MICROSCOPIC-ADD ON

## 2011-04-30 LAB — POCT PREGNANCY, URINE: Preg Test, Ur: NEGATIVE

## 2011-04-30 MED ORDER — ZIPRASIDONE HCL 40 MG PO CAPS
ORAL_CAPSULE | ORAL | Status: AC
  Filled 2011-04-30: qty 1

## 2011-04-30 MED ORDER — ZIPRASIDONE HCL 20 MG PO CAPS
ORAL_CAPSULE | ORAL | Status: AC
  Filled 2011-04-30: qty 1

## 2011-04-30 MED ORDER — ZIPRASIDONE HCL 60 MG PO CAPS: 60.0000 mg | ORAL_CAPSULE | Freq: Two times a day (BID) | ORAL | Status: DC

## 2011-04-30 MED ORDER — ZIPRASIDONE HCL 60 MG PO CAPS
60.0000 mg | ORAL_CAPSULE | Freq: Once | ORAL | Status: AC
  Administered 2011-04-30: 60 mg via ORAL

## 2011-05-03 DIAGNOSIS — N760 Acute vaginitis: Secondary | ICD-10-CM | POA: Diagnosis not present

## 2011-05-03 DIAGNOSIS — I10 Essential (primary) hypertension: Secondary | ICD-10-CM | POA: Diagnosis not present

## 2011-05-03 DIAGNOSIS — Z3049 Encounter for surveillance of other contraceptives: Secondary | ICD-10-CM | POA: Diagnosis not present

## 2011-05-08 ENCOUNTER — Other Ambulatory Visit: Payer: Self-pay | Admitting: *Deleted

## 2011-05-08 DIAGNOSIS — K219 Gastro-esophageal reflux disease without esophagitis: Secondary | ICD-10-CM

## 2011-05-08 DIAGNOSIS — J302 Other seasonal allergic rhinitis: Secondary | ICD-10-CM

## 2011-05-08 MED ORDER — METFORMIN HCL 1000 MG PO TABS: 1000.0000 mg | ORAL_TABLET | Freq: Two times a day (BID) | ORAL | Status: DC

## 2011-05-08 MED ORDER — LORAZEPAM 0.5 MG PO TABS: 0.5000 mg | ORAL_TABLET | Freq: Two times a day (BID) | ORAL | Status: DC | PRN

## 2011-05-08 MED ORDER — HYDROCHLOROTHIAZIDE 25 MG PO TABS: 25.0000 mg | ORAL_TABLET | Freq: Every day | ORAL | Status: DC

## 2011-05-08 MED ORDER — CETIRIZINE HCL 10 MG PO TABS: 10.0000 mg | ORAL_TABLET | Freq: Every day | ORAL | Status: DC

## 2011-05-08 MED ORDER — OMEPRAZOLE 40 MG PO CPDR: 40.0000 mg | DELAYED_RELEASE_CAPSULE | Freq: Two times a day (BID) | ORAL | Status: DC

## 2011-05-08 MED ORDER — LOVASTATIN 20 MG PO TABS: 20.0000 mg | ORAL_TABLET | Freq: Every day | ORAL | Status: DC

## 2011-05-09 ENCOUNTER — Other Ambulatory Visit: Payer: Self-pay | Admitting: *Deleted

## 2011-05-09 MED ORDER — METOPROLOL TARTRATE 50 MG PO TABS: 50.0000 mg | ORAL_TABLET | Freq: Two times a day (BID) | ORAL | Status: DC

## 2011-05-10 ENCOUNTER — Encounter (HOSPITAL_COMMUNITY): Payer: Self-pay | Admitting: *Deleted

## 2011-05-10 ENCOUNTER — Emergency Department (HOSPITAL_COMMUNITY)
Admission: EM | Admit: 2011-05-10 | Discharge: 2011-05-10 | Disposition: A | Discharge status: Home / Self Care | Payer: Medicare Other | Attending: Emergency Medicine | Admitting: Emergency Medicine

## 2011-05-10 DIAGNOSIS — F17200 Nicotine dependence, unspecified, uncomplicated: Secondary | ICD-10-CM | POA: Insufficient documentation

## 2011-05-10 DIAGNOSIS — F411 Generalized anxiety disorder: Secondary | ICD-10-CM | POA: Diagnosis not present

## 2011-05-10 DIAGNOSIS — F329 Major depressive disorder, single episode, unspecified: Secondary | ICD-10-CM | POA: Diagnosis not present

## 2011-05-10 DIAGNOSIS — R112 Nausea with vomiting, unspecified: Secondary | ICD-10-CM | POA: Insufficient documentation

## 2011-05-10 DIAGNOSIS — E669 Obesity, unspecified: Secondary | ICD-10-CM | POA: Insufficient documentation

## 2011-05-10 DIAGNOSIS — E119 Type 2 diabetes mellitus without complications: Secondary | ICD-10-CM | POA: Insufficient documentation

## 2011-05-10 DIAGNOSIS — F259 Schizoaffective disorder, unspecified: Secondary | ICD-10-CM | POA: Diagnosis not present

## 2011-05-10 DIAGNOSIS — I10 Essential (primary) hypertension: Secondary | ICD-10-CM | POA: Insufficient documentation

## 2011-05-10 DIAGNOSIS — K219 Gastro-esophageal reflux disease without esophagitis: Secondary | ICD-10-CM | POA: Insufficient documentation

## 2011-05-10 DIAGNOSIS — F3289 Other specified depressive episodes: Secondary | ICD-10-CM | POA: Insufficient documentation

## 2011-05-10 DIAGNOSIS — Z79899 Other long term (current) drug therapy: Secondary | ICD-10-CM | POA: Insufficient documentation

## 2011-05-10 DIAGNOSIS — E785 Hyperlipidemia, unspecified: Secondary | ICD-10-CM | POA: Insufficient documentation

## 2011-05-10 LAB — GLUCOSE, CAPILLARY: Glucose-Capillary: 150 mg/dL — ABNORMAL HIGH (ref 70–99)

## 2011-05-10 MED ORDER — ONDANSETRON 8 MG PO TBDP
8.0000 mg | ORAL_TABLET | Freq: Once | ORAL | Status: AC
  Administered 2011-05-10: 8 mg via ORAL
  Filled 2011-05-10: qty 1

## 2011-05-10 MED ORDER — ONDANSETRON 8 MG PO TBDP: 8.0000 mg | ORAL_TABLET | Freq: Two times a day (BID) | ORAL | Status: AC | PRN

## 2011-05-10 NOTE — Discharge Instructions (Signed)
Nausea and Vomiting Nausea is a sick feeling that often comes before throwing up (vomiting). Vomiting is a reflex where stomach contents come out of your mouth. Vomiting can cause severe loss of body fluids (dehydration). Children and elderly adults can become dehydrated quickly, especially if they also have diarrhea. Nausea and vomiting are symptoms of a condition or disease. It is important to find the cause of your symptoms. CAUSES   Direct irritation of the stomach lining. This irritation can result from increased acid production (gastroesophageal reflux disease), infection, food poisoning, taking certain medicines (such as nonsteroidal anti-inflammatory drugs), alcohol use, or tobacco use.   Signals from the brain.These signals could be caused by a headache, heat exposure, an inner ear disturbance, increased pressure in the brain from injury, infection, a tumor, or a concussion, pain, emotional stimulus, or metabolic problems.   An obstruction in the gastrointestinal tract (bowel obstruction).   Illnesses such as diabetes, hepatitis, gallbladder problems, appendicitis, kidney problems, cancer, sepsis, atypical symptoms of a heart attack, or eating disorders.   Medical treatments such as chemotherapy and radiation.   Receiving medicine that makes you sleep (general anesthetic) during surgery.  DIAGNOSIS Your caregiver may ask for tests to be done if the problems do not improve after a few days. Tests may also be done if symptoms are severe or if the reason for the nausea and vomiting is not clear. Tests may include:  Urine tests.   Blood tests.   Stool tests.   Cultures (to look for evidence of infection).   X-rays or other imaging studies.  Test results can help your caregiver make decisions about treatment or the need for additional tests. TREATMENT You need to stay well hydrated. Drink frequently but in small amounts.You may wish to drink water, sports drinks, clear broth, or  eat frozen ice pops or gelatin dessert to help stay hydrated.When you eat, eating slowly may help prevent nausea.There are also some antinausea medicines that may help prevent nausea. HOME CARE INSTRUCTIONS   Take all medicine as directed by your caregiver.   If you do not have an appetite, do not force yourself to eat. However, you must continue to drink fluids.   If you have an appetite, eat a normal diet unless your caregiver tells you differently.   Eat a variety of complex carbohydrates (rice, wheat, potatoes, bread), lean meats, yogurt, fruits, and vegetables.   Avoid high-fat foods because they are more difficult to digest.   Drink enough water and fluids to keep your urine clear or pale yellow.   If you are dehydrated, ask your caregiver for specific rehydration instructions. Signs of dehydration may include:   Severe thirst.   Dry lips and mouth.   Dizziness.   Dark urine.   Decreasing urine frequency and amount.   Confusion.   Rapid breathing or pulse.  SEEK IMMEDIATE MEDICAL CARE IF:   You have blood or brown flecks (like coffee grounds) in your vomit.   You have black or bloody stools.   You have a severe headache or stiff neck.   You are confused.   You have severe abdominal pain.   You have chest pain or trouble breathing.   You do not urinate at least once every 8 hours.   You develop cold or clammy skin.   You continue to vomit for longer than 24 to 48 hours.   You have a fever.  MAKE SURE YOU:   Understand these instructions.   Will watch your   condition.   Will get help right away if you are not doing well or get worse.  Document Released: 04/02/2005 Document Revised: 12/13/2010 Document Reviewed: 08/30/2010 [employer] Patient Information 2012 [employer]. 

## 2011-05-10 NOTE — ED Provider Notes (Signed)
History   This chart was scribed for [name]. Jaelah Hauth, MD scribed by Magnus Sinning. The patient was seen in room APA17/APA17 seen at 12:01.    CSN: [id number]  Arrival date & time 05/10/11  1031   First MD Initiated Contact with Patient 05/10/11 1146      Chief Complaint  Patient presents with  . Hypertension    (Consider location/radiation/quality/duration/timing/severity/associated sxs/prior treatment) HPI [name] is a 46 y.o. female who presents to the Emergency Department complaining of sudden onset moderate HTN this morning with associated weakeness, and 3 episodes of emesis last night. Pt reports that she has been under a lot of stress and says her body has been feeling weak, along with inability to sleep. She explains that normally stress does not physically manifest, but that she feels it has "caught up with her and that she has not been feeling well." Denies diarrhea, and myalgias, and states that she has not been sexually active or had sick contact exposure. Pt just began Nuvaring and says she also takes metformin and insulin for DM. Pt explains that she has been going to [facility] because of stress and was being prescribed medication. She furthers that it was when she was being seen today that they took her BP and noticed that it was high, prompting her to report to the ED.    Past Medical History  Diagnosis Date  . Arteriosclerotic cardiovascular disease (ASCVD)     Minimal at cath in [facility].stress nuclear study in 8/08 with nl EF; neg stress echo in 2010  . Diabetes mellitus, type 2 2000    Onset in 2000; no insulin  . Hyperlipidemia   . Hypertension `    during treatment with Geodon  . Gastroesophageal reflux disease     Schatzki's ring  . Anemia, iron deficiency   . Alcohol abuse   . Depression   . Community acquired pneumonia 01/03/10    2011; with pleural effusion-[facility] acute resp failure  . Obesity     Past Surgical History    Procedure Date  . Dilation and curettage, diagnostic / therapeutic 1992    Family History  Problem Relation Age of Onset  . Colon cancer Neg Hx   . Hypertension Mother   . Stroke Father   . Heart disease Sister     History  Substance Use Topics  . Smoking status: Current Everyday Smoker -- 1.0 packs/day for 30 years    Types: Cigarettes  . Smokeless tobacco: Not on file   Comment: wants to stop but nerves too bad at present - offered QUIT line info  . Alcohol Use: 0.0 oz/week     occasional   Review of Systems 10 Systems reviewed and are negative for acute change except as noted in the HPI. Allergies  Orange; Shrimp; Penicillins; Sulfonamide derivatives; Glipizide; Metronidazole; and Sulfamethoxazole w/trimethoprim  Home Medications   Current Outpatient Rx  Name Route Sig Dispense Refill  . CETIRIZINE HCL 10 MG PO TABS Oral Take 1 tablet (10 mg total) by mouth daily. 1 tablet 4  . DIPHENHYDRAMINE HCL 25 MG PO TABS Oral Take 50 mg by mouth daily as needed. Sleep/allergies    . FERROUS GLUCONATE IRON 246 (28 FE) MG PO TABS Oral Take 246 mg by mouth 2 (two) times daily.      Marland Kitchen FLUTICASONE PROPIONATE 50 MCG/ACT NA SUSP Nasal Place 2 sprays into the nose daily. 16 g 2  . HYDROCHLOROTHIAZIDE 25 MG PO TABS  Oral Take 1 tablet (25 mg total) by mouth daily. 31 tablet 1  . IBUPROFEN 200 MG PO TABS Oral Take 400 mg by mouth every 6 (six) hours as needed. For pain     . INSULIN GLARGINE 100 UNIT/ML [place] SOLN Subcutaneous Inject 25 Units into the skin at bedtime. 10 mL 11  . LORAZEPAM 0.5 MG PO TABS Oral Take 0.5-1 mg by mouth 2 (two) times daily as needed. For anxiety    . LOVASTATIN 20 MG PO TABS Oral Take 1 tablet (20 mg total) by mouth at bedtime. 31 tablet 1  . METFORMIN HCL 1000 MG PO TABS Oral Take 1 tablet (1,000 mg total) by mouth 2 (two) times daily with a meal. 62 tablet 1  . METOPROLOL TARTRATE 50 MG PO TABS Oral Take 1 tablet (50 mg total) by mouth 2 (two) times daily. 62  tablet 11  . OMEPRAZOLE 40 MG PO CPDR Oral Take 1 capsule (40 mg total) by mouth 2 (two) times daily. 60 capsule 0  . POTASSIUM CHLORIDE CRYS ER 20 MEQ PO TBCR Oral Take 20 mEq by mouth 2 (two) times daily.      Marland Kitchen ZIPRASIDONE HCL 40 MG PO CAPS Oral Take 40 mg by mouth every morning.     Marland Kitchen ZIPRASIDONE HCL 60 MG PO CAPS Oral Take 60 mg by mouth at bedtime.        BP 135/95  Pulse 91  Temp 98.6 F (37 C)  Resp 20  Ht 5\' 4"  (1.626 m)  Wt 183 lb (83.008 kg)  BMI 31.41 kg/m2  SpO2 100%  LMP 04/26/2011  Physical Exam  Nursing note and vitals reviewed. Constitutional: She is oriented to person, place, and time. She appears well-developed and well-nourished. No distress.  HENT:  Head: Normocephalic and atraumatic.  Eyes: EOM are normal. Pupils are equal, round, and reactive to light.  Neck: Neck supple. No tracheal deviation present.  Cardiovascular: Normal rate.   Pulmonary/Chest: Effort normal. No respiratory distress.  Abdominal: Soft. She exhibits no distension.  Musculoskeletal: Normal range of motion. She exhibits no edema.  Neurological: She is alert and oriented to person, place, and time. No sensory deficit.  Skin: Skin is warm and dry.  Psychiatric: She has a normal mood and affect. Her behavior is normal.    ED Course  Procedures (including critical care time) DIAGNOSTIC STUDIES: Oxygen Saturation is 100% on room air, normal by my interpretation.    COORDINATION OF CARE: 12:30: Pt started on ondansetron disintegrating tablet 8 mg.  Results for orders placed during the hospital encounter of 05/10/11  GLUCOSE, CAPILLARY      Component Value Range   Glucose-Capillary 150 (*) 70 - 99 (mg/dL)   1. Nausea and vomiting       MDM  I personally performed the services described in this documentation, which was scribed in my presence. The recorded information has been reviewed and considered.   By history the patient's elevated blood pressure was taken in the midst of  feeling very anxious and distressed. Patient denies any chest pain, abdominal pain back pain or headache as far as association with her nausea and vomiting. Patient feels improved here and does not feel nauseated. I did give her some Zofran ODT as well as checking a blood sugar and her glucose here is only 150. I do not feel that the patient has any complications from her diabetes causing her vomiting. She is reassured that her blood pressure was high simply do  to her symptoms. She was given prescriptions after seeing her provider at a marked as far as her anxiety. There is no other psychiatric emergent condition occurring at this time. She is reassured. 2 blood pressures taken here show that she is slightly elevated but certainly not in any dangerous condition. She does take Lopressor at home already. Plan is to discharge home and she can followup with her primary care physician next week as needed.        [name]. Kessler Kopinski, MD 05/10/11 1311

## 2011-05-10 NOTE — ED Notes (Signed)
Pt reports she feels better and is ready to go home.

## 2011-05-10 NOTE — ED Notes (Signed)
Sent from [facility], for high bp,   Says she feels "stressed out".  "I 'm not suicidal , but I just don't  Care."   Vomiting last pm

## 2011-05-10 NOTE — ED Notes (Signed)
Sent from [facility] for high blood pressure; pt reports "under a lot of stress" and states her BP "shot up" due to this; hx of HTN and states she is compliant with meds for this; denies suicidal ideation, but states, "I'm just tired of this life".  States, "I would never try to kill myself again because I don't know what kind of death that would bring".  Reports suicide attempt in past, but states, "I was not in my right mind then.  Now that I'm in my right mind, I would never try to do that again.".

## 2011-05-17 ENCOUNTER — Encounter: Payer: Self-pay | Admitting: Internal Medicine

## 2011-05-17 ENCOUNTER — Ambulatory Visit (INDEPENDENT_AMBULATORY_CARE_PROVIDER_SITE_OTHER): Payer: Medicare Other | Admitting: Internal Medicine

## 2011-05-17 VITALS — BP 100/74 | HR 88 | Temp 97.8°F | Ht 65.0 in | Wt 189.7 lb

## 2011-05-17 DIAGNOSIS — R5381 Other malaise: Secondary | ICD-10-CM | POA: Diagnosis not present

## 2011-05-17 DIAGNOSIS — R5383 Other fatigue: Secondary | ICD-10-CM | POA: Insufficient documentation

## 2011-05-17 DIAGNOSIS — E119 Type 2 diabetes mellitus without complications: Secondary | ICD-10-CM | POA: Diagnosis not present

## 2011-05-17 DIAGNOSIS — F329 Major depressive disorder, single episode, unspecified: Secondary | ICD-10-CM

## 2011-05-17 DIAGNOSIS — M79609 Pain in unspecified limb: Secondary | ICD-10-CM

## 2011-05-17 DIAGNOSIS — F17200 Nicotine dependence, unspecified, uncomplicated: Secondary | ICD-10-CM | POA: Diagnosis not present

## 2011-05-17 DIAGNOSIS — F1011 Alcohol abuse, in remission: Secondary | ICD-10-CM

## 2011-05-17 DIAGNOSIS — M79669 Pain in unspecified lower leg: Secondary | ICD-10-CM

## 2011-05-17 DIAGNOSIS — F3289 Other specified depressive episodes: Secondary | ICD-10-CM

## 2011-05-17 MED ORDER — NICOTINE 21 MG/24HR TD PT24: 1.0000 | MEDICATED_PATCH | TRANSDERMAL | Status: AC

## 2011-05-17 NOTE — Assessment & Plan Note (Addendum)
Patient was seen in the ED for weakness and vomiting, and is extremely sleepy today on exam. Her labs from prior workup had been reassuring that whatever vomiting she is experiencing has not cause a metabolic derangement or renal insufficiency. She was not anemic two weeks ago on CBC.  Her fatigue is possibly due to polypharmacy in the setting of Ativan, mirtazapine, Geodon, and cetirizine. It could also be due to worsening depression. After discussion with Dr. Aundria Rud, we decided to advise her to stop cetirizine and mirtazapine, and continue Ativan and Geodon. - Followup in 4 weeks to assess fatigue

## 2011-05-17 NOTE — Assessment & Plan Note (Signed)
Patient plans a right-sided calf pain with ambulation, consistent with possible claudication. She would be young, and she has strong pulses bilaterally in both the DP and TP, so I am not convinced. She denies any recent trauma, and she has no tenderness to palpation on exam. I do not think she has a DVT clinically, and she has no risk factors. I told her to take Tylenol when necessary, and see if it is better/same/worse at followup in 4 weeks. - Followup in 4 weeks

## 2011-05-17 NOTE — Assessment & Plan Note (Addendum)
Patient had been started on metoclopramide by Dr. Gilford Rile but I do not see on her med list. She does complain of continued nausea and vomiting intermittently. She has a history of GERD. She may need a gastric emptying study versus an EGD. After discussion with Dr. Aundria Rud, we decided to have her followup in 4 weeks and reassess her if she continues to have these symptoms. - Consider gastric emptying study versus EGD if symptoms continue

## 2011-05-17 NOTE — Assessment & Plan Note (Signed)
Patient currently on 21 mg nicotine patch. She is still smoking some on the patch, but she had been smoking 2 packs per day, and 21 mg was not initially sufficient. I prescribed her more 21 mg patches and followup in 4 weeks to see her she is doing. She has been self-tapering the amount she smokes, and she has almost stopped entirely. Rather than come up to 42 mg, I encouraged her to stop smoking and stay on 21 for now.

## 2011-05-17 NOTE — Telephone Encounter (Signed)
Refill for Ativan called to pt's pharmacy.  Angelina Ok, RN 05/17/2011 12:31 PM

## 2011-05-17 NOTE — Assessment & Plan Note (Signed)
Patient followed by a psychiatrist at [facility]. She was seen a few days ago and was started on mirtazapine in addition to her Ativan and Geodon. I think her fatigue today on exam his partially due to polypharmacy, so we advised her to stop the mirtazapine and use Ativan as a when necessary sleep aid. This was discussed with Dr. Aundria Rud.

## 2011-05-17 NOTE — Progress Notes (Signed)
Subjective:     Patient ID: [name], female   DOB: [birth year], 46 y.o.   MRN: [id number]  HPI Patient is a 46 year old woman with a history of diabetes, hypertension, depression, tobacco abuse who presents for ER followup. The patient has been seen in the ED 4 times over the past month and half for various complaints including nausea and vomiting, high blood pressure, and chest pain. These visits did not result in any significant findings.  Today, the patient is very sleepy, lying down on the exam table with her eyes closed. She says she's been feeling sleepy for the past month or so. She is seen by [facility] for mental health and a few days ago was started on mirtazapine at night for sleep. She also has Ativan when necessary, and is on Geodon. She also takes Zyrtec for allergies. She says that [facility] is tapering her Geodon down.  As she had been feeling at her prior ED visit in the middle of January, the patient complains of continued nausea and vomiting intermittently. There is no relationship with food, no abnormal pain, and, as noted before, occasional specks of blood.  The patient continues to smoke despite being on nicotine replacement therapy, which is down from the one 800 quit line.  Patient also complains of right calf pain with walking, but feels crampy in nature and is relieved with rest. She denies any trauma to the area. She has no pain on the left side. This has only been going on for a few weeks.  Review of Systems As per history of present illness    Objective:   Physical Exam GEN: NAD.  Alert and oriented x 3.  Sleepy, lying down on table.  Closing eyes throughout exam. HEENT: PERRL 2mm b/l. RESP:  CTAB, no w/r/r CARDIOVASCULAR: RRR, S1, S2, no m/r/g ABDOMEN: soft, NT/ND, NABS EXT: warm and dry. No edema in b/l LE, 2+ DP and TP pulses b/l     Assessment:         Plan:

## 2011-05-18 NOTE — Progress Notes (Signed)
agree

## 2011-05-23 ENCOUNTER — Encounter (HOSPITAL_COMMUNITY): Payer: Self-pay

## 2011-05-23 ENCOUNTER — Emergency Department (HOSPITAL_COMMUNITY)
Admission: EM | Admit: 2011-05-23 | Discharge: 2011-05-23 | Disposition: A | Discharge status: Home / Self Care | Payer: Medicare Other | Attending: Emergency Medicine | Admitting: Emergency Medicine

## 2011-05-23 DIAGNOSIS — Z79899 Other long term (current) drug therapy: Secondary | ICD-10-CM | POA: Insufficient documentation

## 2011-05-23 DIAGNOSIS — Z794 Long term (current) use of insulin: Secondary | ICD-10-CM | POA: Insufficient documentation

## 2011-05-23 DIAGNOSIS — F17200 Nicotine dependence, unspecified, uncomplicated: Secondary | ICD-10-CM | POA: Insufficient documentation

## 2011-05-23 DIAGNOSIS — I2510 Atherosclerotic heart disease of native coronary artery without angina pectoris: Secondary | ICD-10-CM | POA: Diagnosis not present

## 2011-05-23 DIAGNOSIS — E785 Hyperlipidemia, unspecified: Secondary | ICD-10-CM | POA: Diagnosis not present

## 2011-05-23 DIAGNOSIS — F329 Major depressive disorder, single episode, unspecified: Secondary | ICD-10-CM | POA: Diagnosis not present

## 2011-05-23 DIAGNOSIS — F419 Anxiety disorder, unspecified: Secondary | ICD-10-CM

## 2011-05-23 DIAGNOSIS — F3289 Other specified depressive episodes: Secondary | ICD-10-CM | POA: Insufficient documentation

## 2011-05-23 DIAGNOSIS — F411 Generalized anxiety disorder: Secondary | ICD-10-CM | POA: Diagnosis not present

## 2011-05-23 DIAGNOSIS — E119 Type 2 diabetes mellitus without complications: Secondary | ICD-10-CM | POA: Diagnosis not present

## 2011-05-23 DIAGNOSIS — K219 Gastro-esophageal reflux disease without esophagitis: Secondary | ICD-10-CM | POA: Insufficient documentation

## 2011-05-23 DIAGNOSIS — I10 Essential (primary) hypertension: Secondary | ICD-10-CM | POA: Insufficient documentation

## 2011-05-23 MED ORDER — HYDROXYZINE PAMOATE 50 MG PO CAPS: 50.0000 mg | ORAL_CAPSULE | ORAL | Status: AC

## 2011-05-23 NOTE — ED Notes (Signed)
Pt presents with complaints of high blood and and blood sugar of 400 from home pt per. C/o frequent headaches along with left arm numbness at times as well.

## 2011-05-23 NOTE — ED Provider Notes (Signed)
History     CSN: [id number]  Arrival date & time 05/23/11  0610   First MD Initiated Contact with Patient 05/23/11 [phone]      Chief Complaint  Patient presents with  . Hypertension  . Hyperglycemia    (Consider location/radiation/quality/duration/timing/severity/associated sxs/prior treatment) Patient is a 46 y.o. female presenting with hypertension. The history is provided by the patient.  Hypertension This is a recurrent problem. The current episode started 1 to 2 hours ago. The problem occurs constantly. The problem has not changed since onset.Pertinent negatives include no chest pain, no abdominal pain, no headaches and no shortness of breath. The symptoms are aggravated by nothing. The symptoms are relieved by nothing. She has tried nothing for the symptoms.   Patient presents with concern for elevated blood pressure in the range of 140s over 90s in addition to elevated blood sugars at home. She states that she takes her medications as prescribed and saw her physician last week in clinic. She checks her blood pressure regularly and her blood sugars multiple times daily. She admits to feeling anxious about all these issues. She feels like she doesn't sleep well at night due to anxiety. She denies any depression or suicidal ideation. She is willing to try something more than Ativan to help her sleep.  She denies any chest pain difficulty breathing. She denies any leg pain or swelling. She is a smoker and has a mild persistent cough but no new cough or productive sputum.She states her blood sugar was over 400 last night prior to taking medications. She denies any urinary frequency.  No change in diabetic or hypertension medicines.   Past Medical History  Diagnosis Date  . Arteriosclerotic cardiovascular disease (ASCVD)     Minimal at cath in [facility].stress nuclear study in 8/08 with nl EF; neg stress echo in 2010  . Diabetes mellitus, type 2 2000    Onset in 2000; no insulin  .  Hyperlipidemia   . Hypertension `    during treatment with Geodon  . Gastroesophageal reflux disease     Schatzki's ring  . Anemia, iron deficiency   . Alcohol abuse   . Depression   . Community acquired pneumonia 01/03/10    2011; with pleural effusion-[facility] acute resp failure  . Obesity     Past Surgical History  Procedure Date  . Dilation and curettage, diagnostic / therapeutic 1992    Family History  Problem Relation Age of Onset  . Colon cancer Neg Hx   . Hypertension Mother   . Stroke Father   . Heart disease Sister     History  Substance Use Topics  . Smoking status: Current Everyday Smoker -- 1.0 packs/day for 30 years    Types: Cigarettes  . Smokeless tobacco: Not on file   Comment: wants to stop but nerves too bad at present - offered QUIT line info  . Alcohol Use: 0.0 oz/week     occasional    OB History    Grav Para Term Preterm Abortions TAB SAB Ect Mult Living                  Review of Systems  Constitutional: Negative for fever and chills.  HENT: Negative for neck pain and neck stiffness.   Eyes: Negative for pain.  Respiratory: Negative for shortness of breath.   Cardiovascular: Negative for chest pain.  Gastrointestinal: Negative for abdominal pain.  Genitourinary: Negative for dysuria.  Musculoskeletal: Negative for back pain.  Skin: Negative for rash.  Neurological: Negative for headaches.  All other systems reviewed and are negative.    Allergies  Orange; Shrimp; Penicillins; Sulfonamide derivatives; Glipizide; Metronidazole; and Sulfamethoxazole w/trimethoprim  Home Medications   Current Outpatient Rx  Name Route Sig Dispense Refill  . DIPHENHYDRAMINE HCL 25 MG PO TABS Oral Take 50 mg by mouth daily as needed. Sleep/allergies    . FERROUS GLUCONATE IRON 246 (28 FE) MG PO TABS Oral Take 246 mg by mouth 2 (two) times daily.      Marland Kitchen FLUTICASONE PROPIONATE 50 MCG/ACT NA SUSP Nasal Place 2 sprays into the nose daily. 16 g 2    . HYDROCHLOROTHIAZIDE 25 MG PO TABS Oral Take 1 tablet (25 mg total) by mouth daily. 31 tablet 1  . IBUPROFEN 200 MG PO TABS Oral Take 400 mg by mouth every 6 (six) hours as needed. For pain     . INSULIN GLARGINE 100 UNIT/ML  SOLN Subcutaneous Inject 25 Units into the skin at bedtime. 10 mL 11  . LORAZEPAM 0.5 MG PO TABS Oral Take 0.5-1 mg by mouth 2 (two) times daily as needed. For anxiety    . LOVASTATIN 20 MG PO TABS Oral Take 1 tablet (20 mg total) by mouth at bedtime. 31 tablet 1  . METFORMIN HCL 1000 MG PO TABS Oral Take 1 tablet (1,000 mg total) by mouth 2 (two) times daily with a meal. 62 tablet 1  . METOPROLOL TARTRATE 50 MG PO TABS Oral Take 1 tablet (50 mg total) by mouth 2 (two) times daily. 62 tablet 11  . NICOTINE 21 MG/24HR TD PT24 Transdermal Place 1 patch onto the skin daily. 30 patch 0  . OMEPRAZOLE 40 MG PO CPDR Oral Take 1 capsule (40 mg total) by mouth 2 (two) times daily. 60 capsule 0  . POTASSIUM CHLORIDE CRYS ER 20 MEQ PO TBCR Oral Take 20 mEq by mouth 2 (two) times daily.      Marland Kitchen ZIPRASIDONE HCL 40 MG PO CAPS Oral Take 40 mg by mouth every morning.     Marland Kitchen ZIPRASIDONE HCL 60 MG PO CAPS Oral Take 60 mg by mouth at bedtime.        BP 149/97  Pulse 117  Temp(Src) 98.8 F (37.1 C) (Oral)  Resp 16  Ht 5\' 4"  (1.626 m)  Wt 183 lb (83.008 kg)  BMI 31.41 kg/m2  SpO2 96%  LMP 04/26/2011  Physical Exam  Constitutional: She is oriented to person, place, and time. She appears well-developed and well-nourished.  HENT:  Head: Normocephalic and atraumatic.  Eyes: Conjunctivae and EOM are normal. Pupils are equal, round, and reactive to light.  Neck: Trachea normal. Neck supple. No thyromegaly present.  Cardiovascular: Normal rate, regular rhythm, S1 normal, S2 normal and normal pulses.     No systolic murmur is present   No diastolic murmur is present  Pulses:      Radial pulses are 2+ on the right side, and 2+ on the left side.  Pulmonary/Chest: Effort normal and  breath sounds normal. She has no wheezes. She has no rhonchi. She has no rales. She exhibits no tenderness.  Abdominal: Soft. Normal appearance and bowel sounds are normal. There is no tenderness. There is no CVA tenderness and negative Murphy's sign.  Musculoskeletal:       BLE:s Calves nontender, no cords or erythema, negative Homans sign  Neurological: She is alert and oriented to person, place, and time. She has normal strength. No cranial nerve deficit  or sensory deficit. GCS eye subscore is 4. GCS verbal subscore is 5. GCS motor subscore is 6.  Skin: Skin is warm and dry. No rash noted. She is not diaphoretic.  Psychiatric: Her speech is normal.       Cooperative and appropriate    ED Course  Procedures (including critical care time)  Labs Reviewed  GLUCOSE, CAPILLARY - Abnormal; Notable for the following:    Glucose-Capillary 178 (*)    All other components within normal limits      MDM   Blood pressure mildly elevated, but not to the point that requires admission or IV medications to further normalize it. Patient does have psychiatric history but no acute psychosis or depression/suicidal ideation. Reassurance provided. Patient given prescription for Vistaril to take as needed at bedtime to help her sleep. She has all medications at home and primary care followup as needed. No indication for further workup at this time. Stable for discharge home with strict return precautions verbalized as understood.        Sunnie Nielsen, MD 05/23/11 [phone]

## 2011-05-24 ENCOUNTER — Encounter: Payer: Self-pay | Admitting: Cardiology

## 2011-06-01 ENCOUNTER — Telehealth: Payer: Self-pay | Admitting: *Deleted

## 2011-06-01 NOTE — Telephone Encounter (Signed)
Pt calls and states that she cannot go without her zyrtec, states she has tried and is miserable, states her allergies are much worse. Will you consider refilling this, it was stopped at last visit?

## 2011-06-02 ENCOUNTER — Other Ambulatory Visit: Payer: Self-pay | Admitting: Internal Medicine

## 2011-06-02 MED ORDER — CETIRIZINE HCL 10 MG PO TABS: 10.0000 mg | ORAL_TABLET | Freq: Every day | ORAL | Status: DC

## 2011-06-11 ENCOUNTER — Encounter (HOSPITAL_COMMUNITY): Payer: Self-pay

## 2011-06-11 ENCOUNTER — Emergency Department (HOSPITAL_COMMUNITY): Payer: Medicare Other

## 2011-06-11 ENCOUNTER — Emergency Department (HOSPITAL_COMMUNITY)
Admission: EM | Admit: 2011-06-11 | Discharge: 2011-06-11 | Disposition: A | Discharge status: Home Health | Payer: Medicare Other | Attending: Emergency Medicine | Admitting: Emergency Medicine

## 2011-06-11 DIAGNOSIS — R0989 Other specified symptoms and signs involving the circulatory and respiratory systems: Secondary | ICD-10-CM | POA: Insufficient documentation

## 2011-06-11 DIAGNOSIS — R05 Cough: Secondary | ICD-10-CM | POA: Diagnosis not present

## 2011-06-11 DIAGNOSIS — F3289 Other specified depressive episodes: Secondary | ICD-10-CM | POA: Insufficient documentation

## 2011-06-11 DIAGNOSIS — R918 Other nonspecific abnormal finding of lung field: Secondary | ICD-10-CM | POA: Diagnosis not present

## 2011-06-11 DIAGNOSIS — I2510 Atherosclerotic heart disease of native coronary artery without angina pectoris: Secondary | ICD-10-CM | POA: Insufficient documentation

## 2011-06-11 DIAGNOSIS — R112 Nausea with vomiting, unspecified: Secondary | ICD-10-CM | POA: Diagnosis not present

## 2011-06-11 DIAGNOSIS — I10 Essential (primary) hypertension: Secondary | ICD-10-CM | POA: Diagnosis not present

## 2011-06-11 DIAGNOSIS — Z79899 Other long term (current) drug therapy: Secondary | ICD-10-CM | POA: Insufficient documentation

## 2011-06-11 DIAGNOSIS — E119 Type 2 diabetes mellitus without complications: Secondary | ICD-10-CM | POA: Diagnosis not present

## 2011-06-11 DIAGNOSIS — R1110 Vomiting, unspecified: Secondary | ICD-10-CM | POA: Diagnosis not present

## 2011-06-11 DIAGNOSIS — R079 Chest pain, unspecified: Secondary | ICD-10-CM | POA: Diagnosis not present

## 2011-06-11 DIAGNOSIS — Z794 Long term (current) use of insulin: Secondary | ICD-10-CM | POA: Insufficient documentation

## 2011-06-11 DIAGNOSIS — F329 Major depressive disorder, single episode, unspecified: Secondary | ICD-10-CM | POA: Insufficient documentation

## 2011-06-11 DIAGNOSIS — E785 Hyperlipidemia, unspecified: Secondary | ICD-10-CM | POA: Insufficient documentation

## 2011-06-11 DIAGNOSIS — J701 Chronic and other pulmonary manifestations due to radiation: Secondary | ICD-10-CM | POA: Insufficient documentation

## 2011-06-11 DIAGNOSIS — R059 Cough, unspecified: Secondary | ICD-10-CM | POA: Insufficient documentation

## 2011-06-11 DIAGNOSIS — J189 Pneumonia, unspecified organism: Secondary | ICD-10-CM | POA: Diagnosis not present

## 2011-06-11 DIAGNOSIS — R0609 Other forms of dyspnea: Secondary | ICD-10-CM | POA: Insufficient documentation

## 2011-06-11 DIAGNOSIS — F17200 Nicotine dependence, unspecified, uncomplicated: Secondary | ICD-10-CM | POA: Diagnosis not present

## 2011-06-11 MED ORDER — SODIUM CHLORIDE 0.9 % IV BOLUS (SEPSIS)
1000.0000 mL | Freq: Once | INTRAVENOUS | Status: AC
  Administered 2011-06-11: 1000 mL via INTRAVENOUS

## 2011-06-11 MED ORDER — ALBUTEROL SULFATE (5 MG/ML) 0.5% IN NEBU
5.0000 mg | INHALATION_SOLUTION | Freq: Once | RESPIRATORY_TRACT | Status: AC
  Administered 2011-06-11: 5 mg via RESPIRATORY_TRACT
  Filled 2011-06-11: qty 1

## 2011-06-11 MED ORDER — ALBUTEROL SULFATE HFA 108 (90 BASE) MCG/ACT IN AERS
2.0000 | INHALATION_SPRAY | Freq: Once | RESPIRATORY_TRACT | Status: AC
  Administered 2011-06-11: 2 via RESPIRATORY_TRACT
  Filled 2011-06-11: qty 6.7

## 2011-06-11 MED ORDER — AZITHROMYCIN 250 MG PO TABS
500.0000 mg | ORAL_TABLET | Freq: Once | ORAL | Status: AC
  Administered 2011-06-11: 500 mg via ORAL
  Filled 2011-06-11: qty 2

## 2011-06-11 MED ORDER — AZITHROMYCIN 250 MG PO TABS: 250.0000 mg | ORAL_TABLET | Freq: Every day | ORAL | Status: DC

## 2011-06-11 MED ORDER — ONDANSETRON HCL 4 MG/2ML IJ SOLN
4.0000 mg | Freq: Once | INTRAMUSCULAR | Status: AC
  Administered 2011-06-11: 4 mg via INTRAVENOUS
  Filled 2011-06-11: qty 2

## 2011-06-11 MED ORDER — BENZONATATE 100 MG PO CAPS: 200.0000 mg | ORAL_CAPSULE | Freq: Two times a day (BID) | ORAL | Status: AC | PRN

## 2011-06-11 NOTE — Discharge Instructions (Signed)
Your x-ray shows an early pneumonia in the right lung. This is very small. We have given you the antibiotic called Zithromax which she should take once a day for the next 5 days exactly as prescribed. You may also use the inhaler that we have given you 2 puffs every 4 hours as needed for difficulty breathing or coughing. Tessalon may also help with her cough. If you should develop severe or worsening symptoms including severe difficulty breathing, worsening cough, severe shortness of breath or high fevers, return to the emergency department immediately. Otherwise followup with your Dr. in 24 hours or return to the emergency department for a recheck in 24 hours. I will be at [facility] in [place] this evening after 11:00 if you would like me to recheck your lungs.   Pneumonia, Adult Pneumonia is an infection of the lungs.  CAUSES Pneumonia may be caused by bacteria or a virus. Usually, these infections are caused by breathing infectious particles into the lungs (respiratory tract). SYMPTOMS   Cough.   Fever.   Chest pain.   Increased rate of breathing.   Wheezing.   Mucus production.  DIAGNOSIS  If you have the common symptoms of pneumonia, your caregiver will typically confirm the diagnosis with a chest X-ray. The X-ray will show an abnormality in the lung (pulmonary infiltrate) if you have pneumonia. Other tests of your blood, urine, or sputum may be done to find the specific cause of your pneumonia. Your caregiver may also do tests (blood gases or pulse oximetry) to see how well your lungs are working. TREATMENT  Some forms of pneumonia may be spread to other people when you cough or sneeze. You may be asked to wear a mask before and during your exam. Pneumonia that is caused by bacteria is treated with antibiotic medicine. Pneumonia that is caused by the influenza virus may be treated with an antiviral medicine. Most other viral infections must run their course. These  infections will not respond to antibiotics.  PREVENTION A pneumococcal shot (vaccine) is available to prevent a common bacterial cause of pneumonia. This is usually suggested for:  People over 4 years old.   Patients on chemotherapy.   People with chronic lung problems, such as bronchitis or emphysema.   People with immune system problems.  If you are over 65 or have a high risk condition, you may receive the pneumococcal vaccine if you have not received it before. In some countries, a routine influenza vaccine is also recommended. This vaccine can help prevent some cases of pneumonia.You may be offered the influenza vaccine as part of your care. If you smoke, it is time to quit. You may receive instructions on how to stop smoking. Your caregiver can provide medicines and counseling to help you quit. HOME CARE INSTRUCTIONS   Cough suppressants may be used if you are losing too much rest. However, coughing protects you by clearing your lungs. You should avoid using cough suppressants if you can.   Your caregiver may have prescribed medicine if he or she thinks your pneumonia is caused by a bacteria or influenza. Finish your medicine even if you start to feel better.   Your caregiver may also prescribe an expectorant. This loosens the mucus to be coughed up.   Only take over-the-counter or prescription medicines for pain, discomfort, or fever as directed by your caregiver.   Do not smoke. Smoking is a common cause of bronchitis and can contribute to pneumonia. If you are a smoker  and continue to smoke, your cough may last several weeks after your pneumonia has cleared.   A cold steam vaporizer or humidifier in your room or home may help loosen mucus.   Coughing is often worse at night. Sleeping in a semi-upright position in a recliner or using a couple pillows under your head will help with this.   Get rest as you feel it is needed. Your body will usually let you know when you need to  rest.  SEEK IMMEDIATE MEDICAL CARE IF:   Your illness becomes worse. This is especially true if you are elderly or weakened from any other disease.   You cannot control your cough with suppressants and are losing sleep.   You begin coughing up blood.   You develop pain which is getting worse or is uncontrolled with medicines.   You have a fever.   Any of the symptoms which initially brought you in for treatment are getting worse rather than better.   You develop shortness of breath or chest pain.  MAKE SURE YOU:   Understand these instructions.   Will watch your condition.   Will get help right away if you are not doing well or get worse.  Document Released: 04/02/2005 Document Revised: 12/13/2010 Document Reviewed: 06/22/2010 [facility] Patient Information 2012 [place], [place].

## 2011-06-11 NOTE — ED Notes (Signed)
Patient states that she feels short of breath at this time.

## 2011-06-11 NOTE — ED Notes (Signed)
Started coughing a few days ago per pt. Then started vomiting tonight per pt. Cough until I vomit per pt. I smoke and I have done had the flu per pt. I either have bronchitis or pneumonia per pt. Coughing continuous during triage.

## 2011-06-11 NOTE — ED Provider Notes (Signed)
History     CSN: [id number]  Arrival date & time 06/11/11  4034   First MD Initiated Contact with Patient 06/11/11 0448      Chief Complaint  Patient presents with  . Pneumonia    (Consider location/radiation/quality/duration/timing/severity/associated sxs/prior treatment) HPI Comments: 46 year old female with a history of hypertension diabetes and bronchitis who is an active smoker who presents with a complaint of difficulty breathing, coughing, nausea and vomiting. Symptoms started in the last several days, they have been persistent, nothing makes them better or worse, it is not associated with diarrhea, rash, fever, chills, abdominal pain, chest pain. She has had no medication prior to arrival and has not been evaluated by her physician for this complaint  Patient is a 46 y.o. female presenting with pneumonia. The history is provided by the patient and medical records.  Pneumonia    Past Medical History  Diagnosis Date  . Arteriosclerotic cardiovascular disease (ASCVD)     Minimal at cath in [facility].stress nuclear study in 8/08 with nl EF; neg stress echo in 2010  . Diabetes mellitus, type 2 2000    Onset in 2000; no insulin  . Hyperlipidemia   . Hypertension `    during treatment with Geodon  . Gastroesophageal reflux disease     Schatzki's ring  . Anemia, iron deficiency   . Alcohol abuse   . Depression   . Community acquired pneumonia 01/03/10    2011; with pleural effusion-[facility] acute resp failure  . Obesity     Past Surgical History  Procedure Date  . Dilation and curettage, diagnostic / therapeutic 1992    Family History  Problem Relation Age of Onset  . Colon cancer Neg Hx   . Hypertension Mother   . Stroke Father   . Heart disease Sister     History  Substance Use Topics  . Smoking status: Current Everyday Smoker -- 1.0 packs/day for 30 years    Types: Cigarettes  . Smokeless tobacco: Not on file   Comment: wants to stop but  nerves too bad at present - offered QUIT line info  . Alcohol Use: 0.0 oz/week     occasional    OB History    Grav Para Term Preterm Abortions TAB SAB Ect Mult Living                  Review of Systems  All other systems reviewed and are negative.    Allergies  Orange; Shrimp; Penicillins; Sulfonamide derivatives; Glipizide; Metronidazole; and Sulfamethoxazole w/trimethoprim  Home Medications   Current Outpatient Rx  Name Route Sig Dispense Refill  . CETIRIZINE HCL 10 MG PO TABS Oral Take 1 tablet (10 mg total) by mouth daily. 30 tablet 3  . FERROUS GLUCONATE IRON 246 (28 FE) MG PO TABS Oral Take 246 mg by mouth 2 (two) times daily.      Marland Kitchen FLUTICASONE PROPIONATE 50 MCG/ACT NA SUSP Nasal Place 2 sprays into the nose daily. 16 g 2  . HYDROCHLOROTHIAZIDE 25 MG PO TABS Oral Take 1 tablet (25 mg total) by mouth daily. 31 tablet 1  . INSULIN GLARGINE 100 UNIT/ML  SOLN Subcutaneous Inject 25 Units into the skin at bedtime. 10 mL 11  . LOVASTATIN 20 MG PO TABS Oral Take 1 tablet (20 mg total) by mouth at bedtime. 31 tablet 1  . METFORMIN HCL 1000 MG PO TABS Oral Take 1 tablet (1,000 mg total) by mouth 2 (two) times daily with a meal.  62 tablet 1  . METOPROLOL TARTRATE 50 MG PO TABS Oral Take 1 tablet (50 mg total) by mouth 2 (two) times daily. 62 tablet 11  . OMEPRAZOLE 40 MG PO CPDR Oral Take 1 capsule (40 mg total) by mouth 2 (two) times daily. 60 capsule 0  . POTASSIUM CHLORIDE CRYS ER 20 MEQ PO TBCR Oral Take 20 mEq by mouth 2 (two) times daily.      Marland Kitchen ZIPRASIDONE HCL 40 MG PO CAPS Oral Take 40 mg by mouth every morning.     Marland Kitchen ZIPRASIDONE HCL 60 MG PO CAPS Oral Take 60 mg by mouth at bedtime.      . AZITHROMYCIN 250 MG PO TABS Oral Take 1 tablet (250 mg total) by mouth daily. 500mg  PO day 1, then 250mg  PO days 205 6 tablet 0  . BENZONATATE 100 MG PO CAPS Oral Take 2 capsules (200 mg total) by mouth 2 (two) times daily as needed for cough. 20 capsule 0  . DIPHENHYDRAMINE HCL 25 MG  PO TABS Oral Take 50 mg by mouth daily as needed. Sleep/allergies    . IBUPROFEN 200 MG PO TABS Oral Take 400 mg by mouth every 6 (six) hours as needed. For pain     . LORAZEPAM 0.5 MG PO TABS Oral Take 0.5-1 mg by mouth 2 (two) times daily as needed. For anxiety    . NICOTINE 21 MG/24HR TD PT24 Transdermal Place 1 patch onto the skin daily. 30 patch 0    BP 105/78  Pulse 104  Temp(Src) 99.6 F (37.6 C) (Oral)  Resp 20  Ht 5\' 4"  (1.626 m)  Wt 186 lb (84.369 kg)  BMI 31.93 kg/m2  SpO2 99%  Physical Exam  Nursing note and vitals reviewed. Constitutional: She appears well-developed and well-nourished. No distress.  HENT:  Head: Normocephalic and atraumatic.  Mouth/Throat: Oropharynx is clear and moist. No oropharyngeal exudate.  Eyes: Conjunctivae and EOM are normal. Pupils are equal, round, and reactive to light. Right eye exhibits no discharge. Left eye exhibits no discharge. No scleral icterus.  Neck: Normal range of motion. Neck supple. No JVD present. No thyromegaly present.  Cardiovascular: Normal rate, regular rhythm, normal heart sounds and intact distal pulses.  Exam reveals no gallop and no friction rub.   No murmur heard. Pulmonary/Chest: Effort normal and breath sounds normal. No respiratory distress. She has no wheezes. She has no rales.  Abdominal: Soft. Bowel sounds are normal. She exhibits no distension and no mass. There is no tenderness.  Musculoskeletal: Normal range of motion. She exhibits no edema and no tenderness.  Lymphadenopathy:    She has no cervical adenopathy.  Neurological: She is alert. Coordination normal.  Skin: Skin is warm and dry. No rash noted. No erythema.  Psychiatric: She has a normal mood and affect. Her behavior is normal.    ED Course  Procedures (including critical care time)  Labs Reviewed  GLUCOSE, CAPILLARY - Abnormal; Notable for the following:    Glucose-Capillary 138 (*)    All other components within normal limits   Dg Chest  2 View  06/11/2011  *RADIOLOGY REPORT*  Clinical Data: Cough and emesis; chest pain.  History of smoking and diabetes.  CHEST - 2 VIEW  Comparison: Chest radiograph performed 04/30/2011  Findings: The lungs are well-aerated.  Mild right basilar opacity raises concern for mild pneumonia.  There is no evidence of pleural effusion or pneumothorax.  The heart is normal in size; the mediastinal contour is within normal  limits.  No acute osseous abnormalities are seen.  IMPRESSION: Mild right basilar airspace opacity raises concern for mild pneumonia.  Original Report Authenticated By: Tonia Ghent, M.D.     1. Community acquired pneumonia       MDM  Oxygen saturation of 97% on room air, pulse of 115 on my exam, blood pressure is normal and temperature is borderline fever. Will check chest x-ray to rule out pneumonia, CBG, IV fluid bolus due to patient's inability to tolerate fluids and foods today. She states that the majority of her episodes of vomiting, in the strength of significant coughing spells. A post tussive emesis    ED Course:  Patient was given IV fluids for dehydration, albuterol nebulizer to help with her breathing and Zithromax to help with an early pneumonia. She has improved significantly, her cough is minimal, her pulse has reduced to 110 after oxygen levels have improved to 100%. She has no fever and no hypotension. I discussed with her at length the need for recheck within 24-48 hours if she is still having symptoms. I have offered to recheck her tomorrow night at [facility] for her to have a recheck at her family Dr. in the next 24 hours. She is expressed her understanding and is amenable to plan   Labarotory results reviewed:  Blood sugar 138   Radiology imaging reviewed:  Chest x-ray according to my interpretation shows a small right basilar pneumonia. Radiologist read in agreement   Previous Records reviewed: Admissions for diabetes, ER visits for chest  pain   Medications given in ED: Albuterol nebulizer and MDI, Zithromax, Zofran, normal saline bolus  The pt and (or) family members were informed of results and need for close follow up  Consultation: Followup in 24 hours  Disposition:  Home     Discharge Prescriptions include:  Tessalon Zithromax Albuterol MDI      Vida Roller, MD 06/11/11 [phone]

## 2011-06-12 ENCOUNTER — Inpatient Hospital Stay (HOSPITAL_COMMUNITY)
Admission: EM | Admit: 2011-06-12 | Discharge: 2011-06-15 | DRG: 195 | Disposition: A | Discharge status: Home / Self Care | Payer: Medicare Other | Attending: Internal Medicine | Admitting: Internal Medicine

## 2011-06-12 ENCOUNTER — Ambulatory Visit: Payer: Medicare Other | Admitting: Cardiology

## 2011-06-12 ENCOUNTER — Encounter (HOSPITAL_COMMUNITY): Payer: Self-pay

## 2011-06-12 ENCOUNTER — Emergency Department (HOSPITAL_COMMUNITY): Payer: Medicare Other

## 2011-06-12 DIAGNOSIS — Z888 Allergy status to other drugs, medicaments and biological substances status: Secondary | ICD-10-CM

## 2011-06-12 DIAGNOSIS — Z882 Allergy status to sulfonamides status: Secondary | ICD-10-CM

## 2011-06-12 DIAGNOSIS — N920 Excessive and frequent menstruation with regular cycle: Secondary | ICD-10-CM

## 2011-06-12 DIAGNOSIS — Z880 Allergy status to penicillin: Secondary | ICD-10-CM

## 2011-06-12 DIAGNOSIS — F1011 Alcohol abuse, in remission: Secondary | ICD-10-CM

## 2011-06-12 DIAGNOSIS — Z6831 Body mass index (BMI) 31.0-31.9, adult: Secondary | ICD-10-CM

## 2011-06-12 DIAGNOSIS — M79669 Pain in unspecified lower leg: Secondary | ICD-10-CM

## 2011-06-12 DIAGNOSIS — F17200 Nicotine dependence, unspecified, uncomplicated: Secondary | ICD-10-CM | POA: Diagnosis present

## 2011-06-12 DIAGNOSIS — E119 Type 2 diabetes mellitus without complications: Secondary | ICD-10-CM | POA: Diagnosis present

## 2011-06-12 DIAGNOSIS — Z91013 Allergy to seafood: Secondary | ICD-10-CM

## 2011-06-12 DIAGNOSIS — K219 Gastro-esophageal reflux disease without esophagitis: Secondary | ICD-10-CM | POA: Diagnosis present

## 2011-06-12 DIAGNOSIS — I10 Essential (primary) hypertension: Secondary | ICD-10-CM | POA: Diagnosis not present

## 2011-06-12 DIAGNOSIS — D696 Thrombocytopenia, unspecified: Secondary | ICD-10-CM | POA: Diagnosis not present

## 2011-06-12 DIAGNOSIS — J189 Pneumonia, unspecified organism: Principal | ICD-10-CM | POA: Diagnosis present

## 2011-06-12 DIAGNOSIS — Z91018 Allergy to other foods: Secondary | ICD-10-CM

## 2011-06-12 DIAGNOSIS — E876 Hypokalemia: Secondary | ICD-10-CM | POA: Diagnosis not present

## 2011-06-12 DIAGNOSIS — E785 Hyperlipidemia, unspecified: Secondary | ICD-10-CM | POA: Diagnosis present

## 2011-06-12 DIAGNOSIS — Z79899 Other long term (current) drug therapy: Secondary | ICD-10-CM

## 2011-06-12 DIAGNOSIS — D509 Iron deficiency anemia, unspecified: Secondary | ICD-10-CM

## 2011-06-12 DIAGNOSIS — F329 Major depressive disorder, single episode, unspecified: Secondary | ICD-10-CM | POA: Diagnosis present

## 2011-06-12 DIAGNOSIS — Z794 Long term (current) use of insulin: Secondary | ICD-10-CM

## 2011-06-12 DIAGNOSIS — I1A0 Resistant hypertension: Secondary | ICD-10-CM | POA: Diagnosis present

## 2011-06-12 DIAGNOSIS — R5383 Other fatigue: Secondary | ICD-10-CM

## 2011-06-12 DIAGNOSIS — F1010 Alcohol abuse, uncomplicated: Secondary | ICD-10-CM | POA: Diagnosis present

## 2011-06-12 DIAGNOSIS — J302 Other seasonal allergic rhinitis: Secondary | ICD-10-CM

## 2011-06-12 DIAGNOSIS — I2510 Atherosclerotic heart disease of native coronary artery without angina pectoris: Secondary | ICD-10-CM

## 2011-06-12 DIAGNOSIS — Z8719 Personal history of other diseases of the digestive system: Secondary | ICD-10-CM

## 2011-06-12 DIAGNOSIS — F3289 Other specified depressive episodes: Secondary | ICD-10-CM | POA: Diagnosis present

## 2011-06-12 DIAGNOSIS — E669 Obesity, unspecified: Secondary | ICD-10-CM | POA: Diagnosis present

## 2011-06-12 LAB — CBC
HCT: 34.5 % — ABNORMAL LOW (ref 36.0–46.0)
Hemoglobin: 11.7 g/dL — ABNORMAL LOW (ref 12.0–15.0)
MCH: 28.8 pg (ref 26.0–34.0)
MCHC: 33.9 g/dL (ref 30.0–36.0)

## 2011-06-12 LAB — DIFFERENTIAL
Basophils Relative: 0 % (ref 0–1)
Eosinophils Absolute: 0.2 10*3/uL (ref 0.0–0.7)
Eosinophils Relative: 1 % (ref 0–5)
Monocytes Absolute: 0.4 10*3/uL (ref 0.1–1.0)
Monocytes Relative: 3 % (ref 3–12)
Neutrophils Relative %: 80 % — ABNORMAL HIGH (ref 43–77)

## 2011-06-12 LAB — BASIC METABOLIC PANEL
BUN: 5 mg/dL — ABNORMAL LOW (ref 6–23)
GFR calc non Af Amer: >90 mL/min (ref >90)
Glucose, Bld: 203 mg/dL — ABNORMAL HIGH (ref 70–99)
Potassium: 3.7 mEq/L (ref 3.5–5.1)

## 2011-06-12 LAB — RAPID HIV SCREEN (WH-MAU): Rapid HIV Screen: NONREACTIVE

## 2011-06-12 MED ORDER — ZIPRASIDONE HCL 60 MG PO CAPS
60.0000 mg | ORAL_CAPSULE | Freq: Every day | ORAL | Status: DC
  Administered 2011-06-12 – 2011-06-14 (×3): 60 mg via ORAL
  Filled 2011-06-12 (×3): qty 1

## 2011-06-12 MED ORDER — LEVOFLOXACIN IN D5W 500 MG/100ML IV SOLN
500.0000 mg | INTRAVENOUS | Status: DC
  Filled 2011-06-12: qty 100

## 2011-06-12 MED ORDER — METOPROLOL TARTRATE 50 MG PO TABS
50.0000 mg | ORAL_TABLET | Freq: Two times a day (BID) | ORAL | Status: DC
  Administered 2011-06-12 – 2011-06-15 (×6): 50 mg via ORAL
  Filled 2011-06-12 (×6): qty 1

## 2011-06-12 MED ORDER — SODIUM CHLORIDE 0.9 % IV SOLN
INTRAVENOUS | Status: DC
  Administered 2011-06-12 – 2011-06-13 (×2): via INTRAVENOUS

## 2011-06-12 MED ORDER — FLUTICASONE PROPIONATE 50 MCG/ACT NA SUSP
NASAL | Status: AC
  Filled 2011-06-12: qty 16

## 2011-06-12 MED ORDER — PANTOPRAZOLE SODIUM 40 MG PO TBEC
80.0000 mg | DELAYED_RELEASE_TABLET | Freq: Two times a day (BID) | ORAL | Status: DC
  Administered 2011-06-13 – 2011-06-15 (×5): 80 mg via ORAL
  Filled 2011-06-12 (×6): qty 2

## 2011-06-12 MED ORDER — HYDROCOD POLST-CHLORPHEN POLST 10-8 MG/5ML PO LQCR
5.0000 mL | Freq: Once | ORAL | Status: AC
  Administered 2011-06-12: 5 mL via ORAL
  Filled 2011-06-12: qty 5

## 2011-06-12 MED ORDER — LORAZEPAM 0.5 MG PO TABS
0.5000 mg | ORAL_TABLET | Freq: Two times a day (BID) | ORAL | Status: DC | PRN
  Administered 2011-06-14: 1 mg via ORAL
  Filled 2011-06-12: qty 2

## 2011-06-12 MED ORDER — FLUTICASONE PROPIONATE 50 MCG/ACT NA SUSP
2.0000 | Freq: Every day | NASAL | Status: DC
  Administered 2011-06-13 – 2011-06-15 (×3): 2 via NASAL
  Filled 2011-06-12: qty 16

## 2011-06-12 MED ORDER — GUAIFENESIN-DM 100-10 MG/5ML PO SYRP
5.0000 mL | ORAL_SOLUTION | ORAL | Status: DC | PRN
  Administered 2011-06-12 – 2011-06-15 (×8): 5 mL via ORAL
  Filled 2011-06-12 (×8): qty 5

## 2011-06-12 MED ORDER — MORPHINE SULFATE 4 MG/ML IJ SOLN
4.0000 mg | Freq: Once | INTRAMUSCULAR | Status: AC
  Administered 2011-06-12: 4 mg via INTRAVENOUS
  Filled 2011-06-12: qty 1

## 2011-06-12 MED ORDER — INSULIN GLARGINE 100 UNIT/ML ~~LOC~~ SOLN
25.0000 [IU] | Freq: Every day | SUBCUTANEOUS | Status: DC
  Administered 2011-06-13 – 2011-06-14 (×2): 25 [IU] via SUBCUTANEOUS
  Filled 2011-06-12: qty 3

## 2011-06-12 MED ORDER — NICOTINE 21 MG/24HR TD PT24
21.0000 mg | MEDICATED_PATCH | TRANSDERMAL | Status: DC
  Administered 2011-06-13 – 2011-06-14 (×2): 21 mg via TRANSDERMAL
  Filled 2011-06-12: qty 1

## 2011-06-12 MED ORDER — SODIUM CHLORIDE 0.9 % IV BOLUS (SEPSIS)
1000.0000 mL | Freq: Once | INTRAVENOUS | Status: AC
  Administered 2011-06-12: 1000 mL via INTRAVENOUS

## 2011-06-12 MED ORDER — SIMVASTATIN 10 MG PO TABS
10.0000 mg | ORAL_TABLET | Freq: Every day | ORAL | Status: DC
  Administered 2011-06-12 – 2011-06-13 (×2): 10 mg via ORAL
  Filled 2011-06-12 (×3): qty 1

## 2011-06-12 MED ORDER — ONDANSETRON HCL 4 MG/2ML IJ SOLN
4.0000 mg | Freq: Once | INTRAMUSCULAR | Status: AC
  Administered 2011-06-12: 4 mg via INTRAVENOUS
  Filled 2011-06-12: qty 2

## 2011-06-12 MED ORDER — ENOXAPARIN SODIUM 40 MG/0.4ML ~~LOC~~ SOLN
40.0000 mg | SUBCUTANEOUS | Status: DC
  Administered 2011-06-12 – 2011-06-14 (×3): 40 mg via SUBCUTANEOUS
  Filled 2011-06-12 (×4): qty 0.4

## 2011-06-12 MED ORDER — GUAIFENESIN ER 600 MG PO TB12
1200.0000 mg | ORAL_TABLET | Freq: Two times a day (BID) | ORAL | Status: DC
  Administered 2011-06-12 – 2011-06-15 (×6): 1200 mg via ORAL
  Filled 2011-06-12 (×6): qty 2

## 2011-06-12 MED ORDER — FERROUS GLUCONATE 324 (38 FE) MG PO TABS
246.0000 mg | ORAL_TABLET | Freq: Two times a day (BID) | ORAL | Status: DC
  Administered 2011-06-13 – 2011-06-15 (×5): 324 mg via ORAL
  Filled 2011-06-12 (×6): qty 1

## 2011-06-12 MED ORDER — INSULIN ASPART 100 UNIT/ML ~~LOC~~ SOLN
0.0000 [IU] | Freq: Three times a day (TID) | SUBCUTANEOUS | Status: DC
  Administered 2011-06-13 (×3): 3 [IU] via SUBCUTANEOUS
  Administered 2011-06-14: 2 [IU] via SUBCUTANEOUS
  Administered 2011-06-14: 5 [IU] via SUBCUTANEOUS
  Administered 2011-06-14: 3 [IU] via SUBCUTANEOUS
  Filled 2011-06-12: qty 3

## 2011-06-12 MED ORDER — INSULIN ASPART 100 UNIT/ML ~~LOC~~ SOLN
0.0000 [IU] | Freq: Every day | SUBCUTANEOUS | Status: DC
  Administered 2011-06-14: 2 [IU] via SUBCUTANEOUS

## 2011-06-12 MED ORDER — LEVOFLOXACIN IN D5W 500 MG/100ML IV SOLN
500.0000 mg | INTRAVENOUS | Status: DC
  Administered 2011-06-13 – 2011-06-14 (×2): 500 mg via INTRAVENOUS
  Filled 2011-06-12 (×6): qty 100

## 2011-06-12 MED ORDER — NICOTINE 21 MG/24HR TD PT24
21.0000 mg | MEDICATED_PATCH | Freq: Once | TRANSDERMAL | Status: AC
  Administered 2011-06-12: 21 mg via TRANSDERMAL
  Filled 2011-06-12 (×3): qty 1

## 2011-06-12 MED ORDER — MOXIFLOXACIN HCL IN NACL 400 MG/250ML IV SOLN
400.0000 mg | Freq: Once | INTRAVENOUS | Status: AC
  Administered 2011-06-12: 400 mg via INTRAVENOUS
  Filled 2011-06-12: qty 250

## 2011-06-12 MED ORDER — ZIPRASIDONE HCL 40 MG PO CAPS
40.0000 mg | ORAL_CAPSULE | Freq: Every morning | ORAL | Status: DC
  Administered 2011-06-13 – 2011-06-15 (×3): 40 mg via ORAL
  Filled 2011-06-12 (×5): qty 1

## 2011-06-12 MED ORDER — DIPHENHYDRAMINE HCL 25 MG PO CAPS
50.0000 mg | ORAL_CAPSULE | Freq: Every day | ORAL | Status: DC | PRN
  Administered 2011-06-12 – 2011-06-15 (×3): 50 mg via ORAL
  Filled 2011-06-12 (×4): qty 2

## 2011-06-12 MED ORDER — METFORMIN HCL 500 MG PO TABS
1000.0000 mg | ORAL_TABLET | Freq: Two times a day (BID) | ORAL | Status: DC
  Administered 2011-06-12 – 2011-06-15 (×5): 1000 mg via ORAL
  Filled 2011-06-12 (×6): qty 2

## 2011-06-12 NOTE — ED Notes (Signed)
Pt states she has pneumonia. States she was told to come back and have another chest x-ray to make sure the pneumonia was clearing. States she has nausea vomiting and diarrhea

## 2011-06-12 NOTE — ED Notes (Signed)
Meal tray ordered 

## 2011-06-12 NOTE — H&P (Signed)
PCP:   Vernice Jefferson, MD, MD   Chief Complaint:  cough  HPI: This is a 46 year old black female who has multiple medical problems. Patient presented on the morning of the 25th to the emergency room with complaints of shortness of breath and cough. Chest x-ray at that time revealed an underlying pneumonia. Patient was given oral Levaquin and was discharged home. She reports that her symptoms hadn't significantly worsened, her cough is getting worse, shortness of breath is worse, therefore she came back to the hospital for evaluation today. Repeat chest x-ray today showed worsening pneumonia. She reports the onset of her symptoms approximately 3 days ago. She also had associated shortness of breath, with productive cough. She felt that she was feverish. She is feels generally weak. She's been having some vomiting with cough. She also reports having diarrhea, since being started on antibiotics. She denies any dysuria. She still actively smoking. She was evaluated in the emergency room and was referred for admission.  Allergies:   Allergies  Allergen Reactions  . Orange Shortness Of Breath and Itching  . Shrimp (Shellfish Allergy) Shortness Of Breath and Itching  . Penicillins Rash    Fever as well  . Sulfonamide Derivatives Rash    fever  . Glipizide Other (See Comments)    psychosis  . Metronidazole Swelling  . Sulfamethoxazole W/Trimethoprim Rash      Past Medical History  Diagnosis Date  . Arteriosclerotic cardiovascular disease (ASCVD)     Minimal at cath in [facility].stress nuclear study in 8/08 with nl EF; neg stress echo in 2010  . Diabetes mellitus, type 2 2000    Onset in 2000; no insulin  . Hyperlipidemia   . Hypertension `    during treatment with Geodon  . Gastroesophageal reflux disease     Schatzki's ring  . Anemia, iron deficiency   . Alcohol abuse   . Depression   . Community acquired pneumonia 01/03/10    2011; with pleural effusion-[facility] acute  resp failure  . Obesity     Past Surgical History  Procedure Date  . Dilation and curettage, diagnostic / therapeutic 1992    Prior to Admission medications   Medication Sig Start Date End Date Taking? Authorizing Provider  azithromycin (ZITHROMAX Z-PAK) 250 MG tablet Take 1 tablet (250 mg total) by mouth daily. 500mg  PO day 1, then 250mg  PO days 205 06/11/11 06/16/11 Yes Vida Roller, MD  benzonatate (TESSALON) 100 MG capsule Take 2 capsules (200 mg total) by mouth 2 (two) times daily as needed for cough. 06/11/11 06/18/11 Yes Vida Roller, MD  cetirizine (ZYRTEC) 10 MG tablet Take 1 tablet (10 mg total) by mouth daily. 06/02/11 06/01/12 Yes Vernice Jefferson, MD  diphenhydrAMINE (BENADRYL) 25 MG tablet Take 50 mg by mouth daily as needed. Sleep/allergies   Yes Historical Provider, MD  ferrous gluconate (FERGON) 246 (28 FE) MG tablet Take 246 mg by mouth 2 (two) times daily.   02/21/11 02/21/12 Yes Ankit Eben Burow, MD  fluticasone (FLONASE) 50 MCG/ACT nasal spray Place 2 sprays into the nose daily. 12/27/10 12/27/11 Yes Vernice Jefferson, MD  hydrochlorothiazide (HYDRODIURIL) 25 MG tablet Take 1 tablet (25 mg total) by mouth daily. 05/08/11  Yes Ulyess Mort, MD  insulin glargine (LANTUS) 100 UNIT/ML injection Inject 25 Units into the skin at bedtime. 04/04/11 04/03/12 Yes Darnelle Maffucci, MD  LORazepam (ATIVAN) 0.5 MG tablet Take 0.5-1 mg by mouth 2 (two) times daily as needed. For anxiety 05/08/11  Yes Roseanne Reno  Aundria Rud, MD  lovastatin (MEVACOR) 20 MG tablet Take 1 tablet (20 mg total) by mouth at bedtime. 05/08/11  Yes Ulyess Mort, MD  metFORMIN (GLUCOPHAGE) 1000 MG tablet Take 1 tablet (1,000 mg total) by mouth 2 (two) times daily with a meal. 05/08/11  Yes Ulyess Mort, MD  metoprolol (LOPRESSOR) 50 MG tablet Take 1 tablet (50 mg total) by mouth 2 (two) times daily. 05/09/11  Yes Blanch Media, MD  nicotine (NICODERM CQ - DOSED IN MG/24 HOURS) 21 mg/24hr patch Place 1 patch onto the skin daily. 05/17/11  06/16/11 Yes Kathreen Cosier, MD  omeprazole (PRILOSEC) 40 MG capsule Take 1 capsule (40 mg total) by mouth 2 (two) times daily. 05/08/11  Yes Ulyess Mort, MD  potassium chloride SA (K-DUR,KLOR-CON) 20 MEQ tablet Take 20 mEq by mouth 2 (two) times daily.     Yes Historical Provider, MD  ibuprofen (ADVIL,MOTRIN) 200 MG tablet Take 400 mg by mouth every 6 (six) hours as needed. For pain     Historical Provider, MD  ziprasidone (GEODON) 40 MG capsule Take 40 mg by mouth every morning.     Historical Provider, MD  ziprasidone (GEODON) 60 MG capsule Take 60 mg by mouth at bedtime.      Historical Provider, MD    Social History:  reports that she has been smoking Cigarettes.  She has a 30 pack-year smoking history. She does not have any smokeless tobacco history on file. She reports that she drinks alcohol. She reports that she does not use illicit drugs.  Family History  Problem Relation Age of Onset  . Colon cancer Neg Hx   . Hypertension Mother   . Stroke Father   . Heart disease Sister     Review of Systems: Positives in bold Constitutional: Denies fever, chills, diaphoresis, appetite change and fatigue.  HEENT: Denies photophobia, eye pain, redness, hearing loss, ear pain, congestion, sore throat, rhinorrhea, sneezing, mouth sores, trouble swallowing, neck pain, neck stiffness and tinnitus.   Respiratory: Denies SOB, DOE, cough, chest tightness,  and wheezing.   Cardiovascular: Denies chest pain, palpitations and leg swelling.  Gastrointestinal: Denies nausea, vomiting, abdominal pain, diarrhea, constipation, blood in stool and abdominal distention.  Genitourinary: Denies dysuria, urgency, frequency, hematuria, flank pain and difficulty urinating.  Musculoskeletal: Denies myalgias, back pain, joint swelling, arthralgias and gait problem.  Skin: Denies pallor, rash and wound.  Neurological: Denies dizziness, seizures, syncope, weakness, light-headedness, numbness and headaches.    Hematological: Denies adenopathy. Easy bruising, personal or family bleeding history  Psychiatric/Behavioral: Denies suicidal ideation, mood changes, confusion, nervousness, sleep disturbance and agitation   Physical Exam: Blood pressure 126/83, pulse 117, temperature 99.2 F (37.3 C), temperature source Oral, resp. rate 20, height 5\' 4"  (1.626 m), weight 84.369 kg (186 lb), last menstrual period 06/05/2011, SpO2 99.00%. Gen. patient is lying in bed, in no acute distress, alert 90x3 Chest: Clear to auscultation bilaterally Neck: Supple Cardiac: S1, S2, tachycardic exam  Abdomen: Soft, nontender, nondistended, bowel sounds are active Extremities: No cyanosis, clubbing, or edema Neurologic: Grossly intact, nonfocal Skin: Intact, warm, no visible rashes  Labs on Admission:  Results for orders placed during the hospital encounter of 06/12/11 (from the past 48 hour(s))  BASIC METABOLIC PANEL     Status: Abnormal   Collection Time   06/12/11 10:54 AM      Component Value Range Comment   Sodium 135  135 - 145 (mEq/L)    Potassium 3.7  3.5 - 5.1 (mEq/L)    Chloride  103  96 - 112 (mEq/L)    CO2 23  19 - 32 (mEq/L)    Glucose, Bld 203 (*) 70 - 99 (mg/dL)    BUN 5 (*) 6 - 23 (mg/dL)    Creatinine, Ser 4.54  0.50 - 1.10 (mg/dL)    Calcium 9.7  8.4 - 10.5 (mg/dL)    GFR calc non Af Amer >90  >90 (mL/min)    GFR calc Af Amer >90  >90 (mL/min)   CBC     Status: Abnormal   Collection Time   06/12/11 10:54 AM      Component Value Range Comment   WBC 13.0 (*) 4.0 - 10.5 (K/uL)    RBC 4.06  3.87 - 5.11 (MIL/uL)    Hemoglobin 11.7 (*) 12.0 - 15.0 (g/dL)    HCT 09.8 (*) 11.9 - 46.0 (%)    MCV 85.0  78.0 - 100.0 (fL)    MCH 28.8  26.0 - 34.0 (pg)    MCHC 33.9  30.0 - 36.0 (g/dL)    RDW 14.7  82.9 - 56.2 (%)    Platelets 222  150 - 400 (K/uL)   DIFFERENTIAL     Status: Abnormal   Collection Time   06/12/11 10:54 AM      Component Value Range Comment   Neutrophils Relative 80 (*) 43 - 77 (%)     Neutro Abs 10.4 (*) 1.7 - 7.7 (K/uL)    Lymphocytes Relative 16  12 - 46 (%)    Lymphs Abs 2.0  0.7 - 4.0 (K/uL)    Monocytes Relative 3  3 - 12 (%)    Monocytes Absolute 0.4  0.1 - 1.0 (K/uL)    Eosinophils Relative 1  0 - 5 (%)    Eosinophils Absolute 0.2  0.0 - 0.7 (K/uL)    Basophils Relative 0  0 - 1 (%)    Basophils Absolute 0.0  0.0 - 0.1 (K/uL)     Radiological Exams on Admission: Dg Chest 2 View  06/12/2011  *RADIOLOGY REPORT*  Clinical Data: History pneumonia, follow-up, smoking history  CHEST - 2 VIEW  Comparison: Chest x-ray of 06/11/2011  Findings: There has been worsening of right middle lobe, right lower lobe, and possibly left lower lobe pneumonia.  No effusion is seen.  The heart is within normal limits in size.  No bony abnormality is noted.  IMPRESSION: Some worsening of lower lobe and right middle lobe pneumonia.  Original Report Authenticated By: Juline Patch, M.D.   Dg Chest 2 View  06/11/2011  *RADIOLOGY REPORT*  Clinical Data: Cough and emesis; chest pain.  History of smoking and diabetes.  CHEST - 2 VIEW  Comparison: Chest radiograph performed 04/30/2011  Findings: The lungs are well-aerated.  Mild right basilar opacity raises concern for mild pneumonia.  There is no evidence of pleural effusion or pneumothorax.  The heart is normal in size; the mediastinal contour is within normal limits.  No acute osseous abnormalities are seen.  IMPRESSION: Mild right basilar airspace opacity raises concern for mild pneumonia.  Original Report Authenticated By: Tonia Ghent, M.D.    Assessment/Plan Principal Problem:  *Pneumonia Active Problems:  DIABETES MELLITUS, TYPE II  HYPERLIPIDEMIA  OBESITY  TOBACCO ABUSE  HYPERTENSION  Plan: Patient will be admitted to a telemetry bed for further treatment. She will receive antibiotics per pneumonia or measures. She'll be treated for community-acquired pneumonia with IV Levaquin. She will receive mucolytics and incentive  spirometry. She'll be monitored here in  the hospital, when she remains afebrile she can likely be transitioned to oral antibiotics and be discharged home. She does not appear to be significantly hypoxic. She is still tachycardic. We will continue with IV fluids and restart her beta blockers.  We will continue her outpatient diabetes regimen.  Her diarrhea is likely secondary to antibiotics. He may use Imodium when necessary.  Patient was counseled on importance of tobacco cessation. She'll be continued on nicotine patch.  Further orders per the clinical course.  Time Spent on Admission:  [name] [facility] Pager: [phone] 06/12/2011, 6:46 PM

## 2011-06-12 NOTE — ED Provider Notes (Signed)
History     CSN: [id number]  Arrival date & time 06/12/11  4098   First MD Initiated Contact with Patient 06/12/11 [phone]      Chief Complaint  Patient presents with  . recheck pneumonia     (Consider location/radiation/quality/duration/timing/severity/associated sxs/prior treatment) HPI Comments: Patient presents for recheck of her pneumonia.  She was seen here early yesterday morning and was diagnosed with a early right sided pneumonia and was placed on Zithromax.  At that time she was also having posttussive emesis, and was placed on Tessalon which she said has helped somewhat but does continue to have episodic severe coughing spells which has resulted in emesis of sputum and clear fluid.  She reports generalized weakness and anorexia.  She has not checked her blood glucose level today, and has had little by mouth intake but has tried to maintain fluid intake.  She denies nausea.  She reports a recent history of diarrhea which was better over the past 24 hours but returned again this morning, reporting brown liquid stool x2 this morning.  She denies abdominal pain, fevers.  She also denies any increased shortness of breath.  The history is provided by the patient.    Past Medical History  Diagnosis Date  . Arteriosclerotic cardiovascular disease (ASCVD)     Minimal at cath in [facility].stress nuclear study in 8/08 with nl EF; neg stress echo in 2010  . Diabetes mellitus, type 2 2000    Onset in 2000; no insulin  . Hyperlipidemia   . Hypertension `    during treatment with Geodon  . Gastroesophageal reflux disease     Schatzki's ring  . Anemia, iron deficiency   . Alcohol abuse   . Depression   . Community acquired pneumonia 01/03/10    2011; with pleural effusion-[facility] acute resp failure  . Obesity     Past Surgical History  Procedure Date  . Dilation and curettage, diagnostic / therapeutic 1992    Family History  Problem Relation Age of Onset  . Colon  cancer Neg Hx   . Hypertension Mother   . Stroke Father   . Heart disease Sister     History  Substance Use Topics  . Smoking status: Current Everyday Smoker -- 1.0 packs/day for 30 years    Types: Cigarettes  . Smokeless tobacco: Not on file   Comment: wants to stop but nerves too bad at present - offered QUIT line info  . Alcohol Use: 0.0 oz/week     occasional    OB History    Grav Para Term Preterm Abortions TAB SAB Ect Mult Living                  Review of Systems  Constitutional: Negative for fever.  HENT: Negative for congestion, sore throat and neck pain.   Eyes: Negative.   Respiratory: Negative for chest tightness and shortness of breath.   Cardiovascular: Negative for chest pain.  Gastrointestinal: Positive for vomiting and diarrhea. Negative for nausea and abdominal pain.  Genitourinary: Negative.   Musculoskeletal: Negative for joint swelling and arthralgias.  Skin: Negative.  Negative for rash and wound.  Neurological: Negative for dizziness, weakness, light-headedness, numbness and headaches.  Hematological: Negative.   Psychiatric/Behavioral: Negative.     Allergies  Orange; Shrimp; Penicillins; Sulfonamide derivatives; Glipizide; Metronidazole; and Sulfamethoxazole w/trimethoprim  Home Medications   No current outpatient prescriptions on file.  BP 126/83  Pulse 117  Temp(Src) 99.2 F (37.3 C) (Oral)  Resp 20  Ht 5\' 4"  (1.626 m)  Wt 186 lb (84.369 kg)  BMI 31.93 kg/m2  SpO2 99%  LMP 06/05/2011  Physical Exam  Nursing note and vitals reviewed. Constitutional: She is oriented to person, place, and time. She appears well-developed and well-nourished.  HENT:  Head: Normocephalic and atraumatic.  Mouth/Throat: Mucous membranes are dry.  Eyes: Conjunctivae are normal.  Neck: Normal range of motion.  Cardiovascular: Normal rate, regular rhythm, normal heart sounds and intact distal pulses.   Pulmonary/Chest: Effort normal and breath sounds  normal. No respiratory distress. She has no wheezes. She has no rales. She exhibits no tenderness.  Abdominal: Soft. Bowel sounds are normal. She exhibits no distension and no mass. There is no tenderness. There is no rebound and no guarding.  Musculoskeletal: Normal range of motion.  Neurological: She is alert and oriented to person, place, and time.  Skin: Skin is warm and dry.  Psychiatric: She has a normal mood and affect.    ED Course  Procedures (including critical care time)  Labs Reviewed  BASIC METABOLIC PANEL - Abnormal; Notable for the following:    Glucose, Bld 203 (*)    BUN 5 (*)    All other components within normal limits  CBC - Abnormal; Notable for the following:    WBC 13.0 (*)    Hemoglobin 11.7 (*)    HCT 34.5 (*)    All other components within normal limits  DIFFERENTIAL - Abnormal; Notable for the following:    Neutrophils Relative 80 (*)    Neutro Abs 10.4 (*)    All other components within normal limits   Dg Chest 2 View  06/12/2011  *RADIOLOGY REPORT*  Clinical Data: History pneumonia, follow-up, smoking history  CHEST - 2 VIEW  Comparison: Chest x-ray of 06/11/2011  Findings: There has been worsening of right middle lobe, right lower lobe, and possibly left lower lobe pneumonia.  No effusion is seen.  The heart is within normal limits in size.  No bony abnormality is noted.  IMPRESSION: Some worsening of lower lobe and right middle lobe pneumonia.  Original Report Authenticated By: Juline Patch, M.D.   Dg Chest 2 View  06/11/2011  *RADIOLOGY REPORT*  Clinical Data: Cough and emesis; chest pain.  History of smoking and diabetes.  CHEST - 2 VIEW  Comparison: Chest radiograph performed 04/30/2011  Findings: The lungs are well-aerated.  Mild right basilar opacity raises concern for mild pneumonia.  There is no evidence of pleural effusion or pneumothorax.  The heart is normal in size; the mediastinal contour is within normal limits.  No acute osseous  abnormalities are seen.  IMPRESSION: Mild right basilar airspace opacity raises concern for mild pneumonia.  Original Report Authenticated By: Tonia Ghent, M.D.     1. Community acquired pneumonia       MDM  Patient will be admitted for worsening pneumonia.  Discussed with Dr. Sandi Mariscal on a triad hospitalists and agrees with admission.  Avelox 400 mg IV was given in ED prior to moving to the floor.  She was also given IV fluids, tussionex which only relieved her coughing for short period, so morphine 4 mg IV given for hopeful improvement in cough.      Candis Musa, PA 06/12/11 1709

## 2011-06-12 NOTE — ED Notes (Signed)
Pt sleeping. 

## 2011-06-12 NOTE — ED Notes (Signed)
Pt states that she was seen in the ED yesterday am. States that she was told that she had pneumonia. Pt states that she is feeling no better. Also states that she has had 2 episodes of diarrhea and 2 episodes of vomiting in the last 24 hours. Pt alert and oriented x 3. Skin warm and dry. Color pink. Breath sounds clear and equal bilaterally. Dry cough noted. No acute distress.

## 2011-06-13 DIAGNOSIS — I10 Essential (primary) hypertension: Secondary | ICD-10-CM | POA: Diagnosis not present

## 2011-06-13 DIAGNOSIS — D696 Thrombocytopenia, unspecified: Secondary | ICD-10-CM | POA: Diagnosis not present

## 2011-06-13 DIAGNOSIS — F17200 Nicotine dependence, unspecified, uncomplicated: Secondary | ICD-10-CM | POA: Diagnosis not present

## 2011-06-13 LAB — GLUCOSE, CAPILLARY
Glucose-Capillary: 188 mg/dL — ABNORMAL HIGH (ref 70–99)
Glucose-Capillary: 198 mg/dL — ABNORMAL HIGH (ref 70–99)

## 2011-06-13 LAB — CBC
Hemoglobin: 10.1 g/dL — ABNORMAL LOW (ref 12.0–15.0)
MCH: 28.5 pg (ref 26.0–34.0)
MCV: 83.9 fL (ref 78.0–100.0)
Platelets: 198 10*3/uL (ref 150–400)
RBC: 3.55 MIL/uL — ABNORMAL LOW (ref 3.87–5.11)
WBC: 12.8 10*3/uL — ABNORMAL HIGH (ref 4.0–10.5)

## 2011-06-13 LAB — BASIC METABOLIC PANEL
CO2: 20 mEq/L (ref 19–32)
Chloride: 105 mEq/L (ref 96–112)
Glucose, Bld: 186 mg/dL — ABNORMAL HIGH (ref 70–99)
Potassium: 3.1 mEq/L — ABNORMAL LOW (ref 3.5–5.1)
Sodium: 133 mEq/L — ABNORMAL LOW (ref 135–145)

## 2011-06-13 MED ORDER — ALBUTEROL SULFATE (5 MG/ML) 0.5% IN NEBU: 2.5000 mg | INHALATION_SOLUTION | RESPIRATORY_TRACT | Status: DC | PRN

## 2011-06-13 MED ORDER — LOPERAMIDE HCL 2 MG PO CAPS
2.0000 mg | ORAL_CAPSULE | ORAL | Status: DC | PRN
  Administered 2011-06-13 – 2011-06-14 (×2): 2 mg via ORAL
  Filled 2011-06-13 (×2): qty 1

## 2011-06-13 MED ORDER — ACETAMINOPHEN 325 MG PO TABS
650.0000 mg | ORAL_TABLET | ORAL | Status: DC | PRN
  Administered 2011-06-13 – 2011-06-14 (×4): 650 mg via ORAL
  Filled 2011-06-13 (×4): qty 2

## 2011-06-13 MED ORDER — LEVALBUTEROL HCL 0.63 MG/3ML IN NEBU
0.6300 mg | INHALATION_SOLUTION | Freq: Four times a day (QID) | RESPIRATORY_TRACT | Status: DC | PRN
  Administered 2011-06-13: 0.63 mg via RESPIRATORY_TRACT

## 2011-06-13 MED ORDER — LEVALBUTEROL HCL 0.63 MG/3ML IN NEBU
INHALATION_SOLUTION | RESPIRATORY_TRACT | Status: AC
  Filled 2011-06-13: qty 3

## 2011-06-13 NOTE — ED Provider Notes (Signed)
Patient presents with increased SOB since yesterday when seen and dx with pna. Unable to sleep last night due to SOB. Lungs: clear to auscultation Cor: RRR . Review of chest xray shows worsening pna. Admission for continued treatment.     Medical screening examination/treatment/procedure(s) were conducted as a shared visit with non-physician practitioner(s) and myself.  I personally evaluated the patient during the encounter.  Nicoletta Dress. Colon Branch, MD 06/13/11 [phone]

## 2011-06-13 NOTE — Progress Notes (Signed)
Patient ID: [name], female   DOB: 1965/12/24, 46 y.o.   MRN: [id number]  Subjective: No events overnight. Patient denies chest pain, shortness of breath, abdominal pain. She has persistent diarrhea.  Objective:  Vital signs in last 24 hours:  Filed Vitals:   06/12/11 2137 06/13/11 0601 06/13/11 1400 06/13/11 1636  BP:  137/83 136/87   Pulse: 102 98 100 117  Temp:  98.5 F (36.9 C) 98.3 F (36.8 C)   TempSrc:   Oral   Resp:  20 20   Height:      Weight:      SpO2:  98% 91% 92%    Intake/Output from previous day:   Intake/Output Summary (Last 24 hours) at 06/13/11 1935 Last data filed at 06/13/11 1500  Gross per 24 hour  Intake    440 ml  Output      0 ml  Net    440 ml    Physical Exam: General: Alert, awake, oriented x3, in no acute distress. HEENT: No bruits, no goiter. Moist mucous membranes, no scleral icterus, no conjunctival pallor. Heart: Regular rate and rhythm, S1/S2 +, no murmurs, rubs, gallops. Lungs: Clear to auscultation bilaterally with minimal bibasilar crackels. No wheezing, no rhonchi, no rales.  Abdomen: Soft, nontender, nondistended, positive bowel sounds. Extremities: No clubbing or cyanosis, no pitting edema,  positive pedal pulses. Neuro: Grossly nonfocal.  Lab Results:  Basic Metabolic Panel:    Component Value Date/Time   NA 133* 06/13/2011 0550   K 3.1* 06/13/2011 0550   CL 105 06/13/2011 0550   CO2 20 06/13/2011 0550   BUN 3* 06/13/2011 0550   CREATININE 0.61 06/13/2011 0550   CREATININE 0.90 04/04/2011 1557   GLUCOSE 186* 06/13/2011 0550   CALCIUM 8.4 06/13/2011 0550   CBC:    Component Value Date/Time   WBC 12.8* 06/13/2011 0550   HGB 10.1* 06/13/2011 0550   HCT 29.8* 06/13/2011 0550   PLT 198 06/13/2011 0550   MCV 83.9 06/13/2011 0550   NEUTROABS 10.4* 06/12/2011 1054   LYMPHSABS 2.0 06/12/2011 1054   MONOABS 0.4 06/12/2011 1054   EOSABS 0.2 06/12/2011 1054   BASOSABS 0.0 06/12/2011 1054      Lab 06/13/11 0550 06/12/11 1054   WBC 12.8* 13.0*  HGB 10.1* 11.7*  HCT 29.8* 34.5*  PLT 198 222  MCV 83.9 85.0  MCH 28.5 28.8  MCHC 33.9 33.9  RDW 13.7 14.2  LYMPHSABS -- 2.0  MONOABS -- 0.4  EOSABS -- 0.2  BASOSABS -- 0.0  BANDABS -- --    Lab 06/13/11 0550 06/12/11 1054  NA 133* 135  K 3.1* 3.7  CL 105 103  CO2 20 23  GLUCOSE 186* 203*  BUN 3* 5*  CREATININE 0.61 0.64  CALCIUM 8.4 9.7  MG -- --   No results found for this basename: INR:5,PROTIME:5 in the last 168 hours Cardiac markers: No results found for this basename: CK:3,CKMB:3,TROPONINI:3,MYOGLOBIN:3 in the last 168 hours No components found with this basename: POCBNP:3 Recent Results (from the past 240 hour(s))  CULTURE, BLOOD (ROUTINE X 2)     Status: Normal (Preliminary result)   Collection Time   06/12/11  6:06 PM      Component Value Range Status Comment   Specimen Description BLOOD LEFT ANTECUBITAL   Final    Special Requests BOTTLES DRAWN AEROBIC AND ANAEROBIC 6CC   Final    Culture NO GROWTH 1 DAY   Final    Report Status PENDING  Incomplete   CULTURE, BLOOD (ROUTINE X 2)     Status: Normal (Preliminary result)   Collection Time   06/12/11  6:12 PM      Component Value Range Status Comment   Specimen Description BLOOD RIGHT ANTECUBITAL   Final    Special Requests BOTTLES DRAWN AEROBIC AND ANAEROBIC 6CC   Final    Culture NO GROWTH 1 DAY   Final    Report Status PENDING   Incomplete     Studies/Results: Dg Chest 2 View  06/12/2011  *RADIOLOGY REPORT*  Clinical Data: History pneumonia, follow-up, smoking history  CHEST - 2 VIEW  Comparison: Chest x-ray of 06/11/2011  Findings: There has been worsening of right middle lobe, right lower lobe, and possibly left lower lobe pneumonia.  No effusion is seen.  The heart is within normal limits in size.  No bony abnormality is noted.  IMPRESSION: Some worsening of lower lobe and right middle lobe pneumonia.  Original Report Authenticated By: Juline Patch, M.D.    Medications: Scheduled  Meds:   . enoxaparin  40 mg Subcutaneous Q24H  . ferrous gluconate  324 mg Oral BID  . fluticasone  2 spray Each Nare Daily  . guaiFENesin  1,200 mg Oral BID  . insulin aspart  0-15 Units Subcutaneous TID WC  . insulin aspart  0-5 Units Subcutaneous QHS  . insulin glargine  25 Units Subcutaneous QHS  . levofloxacin (LEVAQUIN) IV  500 mg Intravenous Q24H  . metFORMIN  1,000 mg Oral BID WC  . metoprolol  50 mg Oral BID  . nicotine  21 mg Transdermal Once  . nicotine  21 mg Transdermal Q24H  . pantoprazole  80 mg Oral BID AC  . simvastatin  10 mg Oral q1800  . ziprasidone  40 mg Oral q morning - 10a  . ziprasidone  60 mg Oral QHS   Continuous Infusions:   . sodium chloride 100 mL/hr at 06/13/11 1635   PRN Meds:.diphenhydrAMINE, guaiFENesin-dextromethorphan, LORazepam  Assessment/Plan:  Principal Problem:  *Pneumonia - pt clinically improving but not at her baseline - will continue antibiotics as noted above - pt is maintaining good oxygen saturations - continue to monitor vitals per floor protocol  Active Problems:  DIABETES MELLITUS, TYPE II - stable for now - CBG's per protocol - continue metformin   HYPERLIPIDEMIA - continue statin   TOBACCO ABUSE - consult cessation provided by me   HYPERTENSION - remains stable   EDUCATION - test results and diagnostic studies were discussed with patient  - patient verbalized the understanding - questions were answered at the bedside and contact information was provided for additional questions or concerns   LOS: 1 day   MAGICK-Ural Acree 06/13/2011, 7:35 PM  [facility] Pager: [phone]

## 2011-06-13 NOTE — Progress Notes (Signed)
Patient heart rate has been tachy all day per nurse patient wanted treatment but want xopenex due to tachy hr 108 sat96  Room air

## 2011-06-14 DIAGNOSIS — D696 Thrombocytopenia, unspecified: Secondary | ICD-10-CM | POA: Diagnosis not present

## 2011-06-14 DIAGNOSIS — I10 Essential (primary) hypertension: Secondary | ICD-10-CM | POA: Diagnosis not present

## 2011-06-14 DIAGNOSIS — F17200 Nicotine dependence, unspecified, uncomplicated: Secondary | ICD-10-CM | POA: Diagnosis not present

## 2011-06-14 LAB — BASIC METABOLIC PANEL
Calcium: 8.6 mg/dL (ref 8.4–10.5)
Chloride: 106 mEq/L (ref 96–112)
GFR calc Af Amer: >90 mL/min (ref >90)
GFR calc non Af Amer: >90 mL/min (ref >90)
Potassium: 3.1 mEq/L — ABNORMAL LOW (ref 3.5–5.1)

## 2011-06-14 LAB — EXPECTORATED SPUTUM ASSESSMENT W GRAM STAIN, RFLX TO RESP C

## 2011-06-14 LAB — CBC
HCT: 30.1 % — ABNORMAL LOW (ref 36.0–46.0)
Hemoglobin: 10.2 g/dL — ABNORMAL LOW (ref 12.0–15.0)
MCV: 84.3 fL (ref 78.0–100.0)
RDW: 13.6 % (ref 11.5–15.5)
WBC: 12.2 10*3/uL — ABNORMAL HIGH (ref 4.0–10.5)

## 2011-06-14 LAB — STREP PNEUMONIAE URINARY ANTIGEN: Strep Pneumo Urinary Antigen: NEGATIVE

## 2011-06-14 MED ORDER — SODIUM CHLORIDE 0.9 % IJ SOLN
INTRAMUSCULAR | Status: AC
  Administered 2011-06-14: 10 mL
  Filled 2011-06-14: qty 3

## 2011-06-14 MED ORDER — ONDANSETRON HCL 4 MG/2ML IJ SOLN
4.0000 mg | Freq: Four times a day (QID) | INTRAMUSCULAR | Status: DC | PRN
  Administered 2011-06-14: 4 mg via INTRAVENOUS
  Filled 2011-06-14: qty 2

## 2011-06-14 MED ORDER — POTASSIUM CHLORIDE CRYS ER 20 MEQ PO TBCR
40.0000 meq | EXTENDED_RELEASE_TABLET | Freq: Two times a day (BID) | ORAL | Status: AC
  Administered 2011-06-14 – 2011-06-15 (×2): 40 meq via ORAL
  Filled 2011-06-14 (×2): qty 2

## 2011-06-14 MED ORDER — SODIUM CHLORIDE 0.9 % IJ SOLN
INTRAMUSCULAR | Status: AC
  Filled 2011-06-14: qty 3

## 2011-06-14 NOTE — Progress Notes (Signed)
CARE MANAGEMENT NOTE 06/14/2011  Patient:  [name], [name]   Account Number:  [id number]  Date Initiated:  06/14/2011  Documentation initiated by:  Rosemary Holms  Subjective/Objective Assessment:   Pt admitted with PNA. PTA lived at home with spouse. Independent.     Action/Plan:   Spoke w/ pt at bedside. No HH needs identified.   Anticipated DC Date:  06/16/2011   Anticipated DC Plan:  HOME/SELF CARE      DC Planning Services  CM consult      Choice offered to / List presented to:             Status of service:  In process, will continue to follow Medicare Important Message given?   (If response is "NO", the following Medicare IM given date fields will be blank) Date Medicare IM given:   Date Additional Medicare IM given:    Discharge Disposition:    Per UR Regulation:    Comments:  06/14/11 1500 Eon Zunker Leanord [facility] RN BSN CM

## 2011-06-14 NOTE — Progress Notes (Signed)
Patient ID: [name], female   DOB: 1966/03/11, 46 y.o.   MRN: [id number]  Subjective: No events overnight. Patient denies chest pain. Pt reports persistent shortness of breath but only slightly improved.   Objective:  Vital signs in last 24 hours:  Filed Vitals:   06/13/11 2240 06/14/11 0613 06/14/11 1050 06/14/11 1502  BP: 121/81 126/81 142/90 131/83  Pulse: 99 100 117 97  Temp: 99.2 F (37.3 C) 98.6 F (37 C)  97.7 F (36.5 C)  TempSrc: Oral Oral  Oral  Resp: 18 18  19   Height:      Weight:      SpO2: 97% 96%  99%    Intake/Output from previous day:   Intake/Output Summary (Last 24 hours) at 06/14/11 1549 Last data filed at 06/14/11 1456  Gross per 24 hour  Intake    340 ml  Output    750 ml  Net   -410 ml    Physical Exam: General: Alert, awake, oriented x3, in no acute distress. HEENT: No bruits, no goiter. Moist mucous membranes, no scleral icterus, no conjunctival pallor. Heart: Regular rate and rhythm, S1/S2 +, no murmurs, rubs, gallops. Lungs: Clear to auscultation bilaterally with scattered rhonchi. No wheezing, no rales.  Abdomen: Soft, nontender, nondistended, positive bowel sounds. Extremities: No clubbing or cyanosis, no pitting edema,  positive pedal pulses. Neuro: Grossly nonfocal.  Lab Results:  Basic Metabolic Panel:    Component Value Date/Time   NA 137 06/14/2011 0528   K 3.1* 06/14/2011 0528   CL 106 06/14/2011 0528   CO2 20 06/14/2011 0528   BUN 3* 06/14/2011 0528   CREATININE 0.57 06/14/2011 0528   CREATININE 0.90 04/04/2011 1557   GLUCOSE 169* 06/14/2011 0528   CALCIUM 8.6 06/14/2011 0528   CBC:    Component Value Date/Time   WBC 12.2* 06/14/2011 0528   HGB 10.2* 06/14/2011 0528   HCT 30.1* 06/14/2011 0528   PLT 230 06/14/2011 0528   MCV 84.3 06/14/2011 0528   NEUTROABS 10.4* 06/12/2011 1054   LYMPHSABS 2.0 06/12/2011 1054   MONOABS 0.4 06/12/2011 1054   EOSABS 0.2 06/12/2011 1054   BASOSABS 0.0 06/12/2011 1054      Lab 06/14/11  0528 06/13/11 0550 06/12/11 1054  WBC 12.2* 12.8* 13.0*  HGB 10.2* 10.1* 11.7*  HCT 30.1* 29.8* 34.5*  PLT 230 198 222  MCV 84.3 83.9 85.0  MCH 28.6 28.5 28.8  MCHC 33.9 33.9 33.9  RDW 13.6 13.7 14.2  LYMPHSABS -- -- 2.0  MONOABS -- -- 0.4  EOSABS -- -- 0.2  BASOSABS -- -- 0.0  BANDABS -- -- --    Lab 06/14/11 0528 06/13/11 0550 06/12/11 1054  NA 137 133* 135  K 3.1* 3.1* 3.7  CL 106 105 103  CO2 20 20 23   GLUCOSE 169* 186* 203*  BUN 3* 3* 5*  CREATININE 0.57 0.61 0.64  CALCIUM 8.6 8.4 9.7  MG -- -- --    Recent Results (from the past 240 hour(s))  CULTURE, BLOOD (ROUTINE X 2)     Status: Normal (Preliminary result)   Collection Time   06/12/11  6:06 PM      Component Value Range Status Comment   Specimen Description BLOOD LEFT ANTECUBITAL   Final    Special Requests BOTTLES DRAWN AEROBIC AND ANAEROBIC 6CC   Final    Culture NO GROWTH 2 DAYS   Final    Report Status PENDING   Incomplete   CULTURE, BLOOD (ROUTINE X  2)     Status: Normal (Preliminary result)   Collection Time   06/12/11  6:12 PM      Component Value Range Status Comment   Specimen Description BLOOD RIGHT ANTECUBITAL   Final    Special Requests BOTTLES DRAWN AEROBIC AND ANAEROBIC 6CC   Final    Culture NO GROWTH 2 DAYS   Final    Report Status PENDING   Incomplete     Studies/Results: No results found.  Medications: Scheduled Meds:   . enoxaparin  40 mg Subcutaneous Q24H  . ferrous gluconate  324 mg Oral BID  . fluticasone  2 spray Each Nare Daily  . guaiFENesin  1,200 mg Oral BID  . insulin aspart  0-15 Units Subcutaneous TID WC  . insulin aspart  0-5 Units Subcutaneous QHS  . insulin glargine  25 Units Subcutaneous QHS  . levalbuterol      . levofloxacin (LEVAQUIN) IV  500 mg Intravenous Q24H  . metFORMIN  1,000 mg Oral BID WC  . metoprolol  50 mg Oral BID  . nicotine  21 mg Transdermal Q24H  . pantoprazole  80 mg Oral BID AC  . simvastatin  10 mg Oral q1800  . sodium chloride      .  sodium chloride      . ziprasidone  40 mg Oral q morning - 10a  . ziprasidone  60 mg Oral QHS   Continuous Infusions:   . DISCONTD: sodium chloride 100 mL/hr at 06/13/11 1635   PRN Meds:.acetaminophen, diphenhydrAMINE, guaiFENesin-dextromethorphan, levalbuterol, loperamide, LORazepam, DISCONTD: albuterol  Assessment/Plan:  Principal Problem:  *Pneumonia - slight clinical improvement but pt still appears tired  - will continue antibiotics - pt remains afebrile but WBC still elevated - will continue to monitor vitals per floor protocol - reassess in the AM  Active Problems:  DIABETES MELLITUS, TYPE II - remains stable - continue insulin   Hypokalemia - supplement   HYPERLIPIDEMIA - continue statin for now   OBESITY, BMI > 30 - diet recommendations provided   TOBACCO ABUSE - consult for cessation provided by me   HYPERTENSION - remains stable   EDUCATION - test results and diagnostic studies were discussed with patient and pt's family who was present at the bedside - patient and family have verbalized the understanding - questions were answered at the bedside and contact information was provided for additional questions or concerns   LOS: 2 days   MAGICK-Eliza Green 06/14/2011, 3:49 PM  [facility] Pager: [phone]

## 2011-06-14 NOTE — Progress Notes (Signed)
Patient of feeling short of breath after coughing spell, Robitussin po given, O2 sat 96% on RA, called RT for breathing treatment

## 2011-06-15 ENCOUNTER — Encounter: Payer: Self-pay | Admitting: Adult Health

## 2011-06-15 ENCOUNTER — Encounter: Payer: Self-pay | Admitting: Internal Medicine

## 2011-06-15 DIAGNOSIS — I10 Essential (primary) hypertension: Secondary | ICD-10-CM | POA: Diagnosis not present

## 2011-06-15 DIAGNOSIS — F17200 Nicotine dependence, unspecified, uncomplicated: Secondary | ICD-10-CM | POA: Diagnosis not present

## 2011-06-15 DIAGNOSIS — D696 Thrombocytopenia, unspecified: Secondary | ICD-10-CM | POA: Diagnosis not present

## 2011-06-15 LAB — CBC
HCT: 29.3 % — ABNORMAL LOW (ref 36.0–46.0)
MCH: 29.1 pg (ref 26.0–34.0)
MCHC: 34.5 g/dL (ref 30.0–36.0)
MCV: 84.4 fL (ref 78.0–100.0)
RDW: 13.6 % (ref 11.5–15.5)

## 2011-06-15 LAB — BASIC METABOLIC PANEL
BUN: 3 mg/dL — ABNORMAL LOW (ref 6–23)
Creatinine, Ser: 0.57 mg/dL (ref 0.50–1.10)
GFR calc Af Amer: >90 mL/min (ref >90)
GFR calc non Af Amer: >90 mL/min (ref >90)

## 2011-06-15 MED ORDER — ONDANSETRON HCL 4 MG PO TABS: 4.0000 mg | ORAL_TABLET | Freq: Three times a day (TID) | ORAL | Status: AC | PRN

## 2011-06-15 MED ORDER — LEVOFLOXACIN 500 MG PO TABS: 500.0000 mg | ORAL_TABLET | Freq: Every day | ORAL | Status: AC

## 2011-06-15 MED ORDER — GUAIFENESIN-DM 100-10 MG/5ML PO SYRP: 5.0000 mL | ORAL_SOLUTION | ORAL | Status: AC | PRN

## 2011-06-15 MED ORDER — LOPERAMIDE HCL 2 MG PO CAPS: 2.0000 mg | ORAL_CAPSULE | ORAL | Status: AC | PRN

## 2011-06-15 NOTE — Progress Notes (Signed)
Attempted to do PT eval.  Pt declines eval stating that she has no physical problems, just occasional lightheadedness.  I asked her to let MD know if her status changes and she would like eval done after all.

## 2011-06-15 NOTE — Discharge Summary (Signed)
Physician Discharge Summary  Patient ID: [name] MRN: [id number] DOB/AGE: 01/14/1966 46 y.o. Primary Care Physician:KAPADIA, Anselm Lis, MD, MD Admit date: 06/12/2011 Discharge date: 06/15/2011    Discharge Diagnoses:  1. Right middle and lower lobe pneumonia, community acquired. 2. Diabetes mellitus. 3. Tobacco abuse. 4. Hypertension. 5. Obesity.   Medication List  As of 06/15/2011 11:25 AM   STOP taking these medications         azithromycin 250 MG tablet      ibuprofen 200 MG tablet         TAKE these medications         benzonatate 100 MG capsule   Commonly known as: TESSALON   Take 2 capsules (200 mg total) by mouth 2 (two) times daily as needed for cough.      cetirizine 10 MG tablet   Commonly known as: ZYRTEC   Take 1 tablet (10 mg total) by mouth daily.      diphenhydrAMINE 25 MG tablet   Commonly known as: BENADRYL   Take 50 mg by mouth daily as needed. Sleep/allergies      ferrous gluconate 246 (28 FE) MG tablet   Commonly known as: FERGON   Take 246 mg by mouth 2 (two) times daily.      fluticasone 50 MCG/ACT nasal spray   Commonly known as: FLONASE   Place 2 sprays into the nose daily.      guaiFENesin-dextromethorphan 100-10 MG/5ML syrup   Commonly known as: ROBITUSSIN DM   Take 5 mLs by mouth every 4 (four) hours as needed for cough.      hydrochlorothiazide 25 MG tablet   Commonly known as: HYDRODIURIL   Take 1 tablet (25 mg total) by mouth daily.      insulin glargine 100 UNIT/ML injection   Commonly known as: LANTUS   Inject 25 Units into the skin at bedtime.      levofloxacin 500 MG tablet   Commonly known as: LEVAQUIN   Take 1 tablet (500 mg total) by mouth daily.      loperamide 2 MG capsule   Commonly known as: IMODIUM   Take 1 capsule (2 mg total) by mouth as needed for diarrhea or loose stools.      LORazepam 0.5 MG tablet   Commonly known as: ATIVAN   Take 0.5-1 mg by mouth 2 (two) times daily as needed. For anxiety     lovastatin 20 MG tablet   Commonly known as: MEVACOR   Take 1 tablet (20 mg total) by mouth at bedtime.      metFORMIN 1000 MG tablet   Commonly known as: GLUCOPHAGE   Take 1 tablet (1,000 mg total) by mouth 2 (two) times daily with a meal.      metoprolol 50 MG tablet   Commonly known as: LOPRESSOR   Take 1 tablet (50 mg total) by mouth 2 (two) times daily.      nicotine 21 mg/24hr patch   Commonly known as: NICODERM CQ - dosed in mg/24 hours   Place 1 patch onto the skin daily.      omeprazole 40 MG capsule   Commonly known as: PRILOSEC   Take 1 capsule (40 mg total) by mouth 2 (two) times daily.      ondansetron 4 MG tablet   Commonly known as: ZOFRAN   Take 1 tablet (4 mg total) by mouth every 8 (eight) hours as needed for nausea.      potassium chloride SA  20 MEQ tablet   Commonly known as: K-DUR,KLOR-CON   Take 20 mEq by mouth 2 (two) times daily.      ziprasidone 40 MG capsule   Commonly known as: GEODON   Take 40 mg by mouth every morning.      ziprasidone 60 MG capsule   Commonly known as: GEODON   Take 60 mg by mouth at bedtime.            Discharged Condition: Improved and stable.    Consults: None.  Significant Diagnostic Studies: Dg Chest 2 View  06/12/2011  *RADIOLOGY REPORT*  Clinical Data: History pneumonia, follow-up, smoking history  CHEST - 2 VIEW  Comparison: Chest x-ray of 06/11/2011  Findings: There has been worsening of right middle lobe, right lower lobe, and possibly left lower lobe pneumonia.  No effusion is seen.  The heart is within normal limits in size.  No bony abnormality is noted.  IMPRESSION: Some worsening of lower lobe and right middle lobe pneumonia.  Original Report Authenticated By: Juline Patch, M.D.   Dg Chest 2 View  06/11/2011  *RADIOLOGY REPORT*  Clinical Data: Cough and emesis; chest pain.  History of smoking and diabetes.  CHEST - 2 VIEW  Comparison: Chest radiograph performed 04/30/2011  Findings: The lungs are  well-aerated.  Mild right basilar opacity raises concern for mild pneumonia.  There is no evidence of pleural effusion or pneumothorax.  The heart is normal in size; the mediastinal contour is within normal limits.  No acute osseous abnormalities are seen.  IMPRESSION: Mild right basilar airspace opacity raises concern for mild pneumonia.  Original Report Authenticated By: Tonia Ghent, M.D.    Lab Results: Basic Metabolic Panel:  Basename 06/15/11 0457 06/14/11 0528  NA 138 137  K 3.4* 3.1*  CL 108 106  CO2 22 20  GLUCOSE 132* 169*  BUN 3* 3*  CREATININE 0.57 0.57  CALCIUM 8.6 8.6  MG -- --  PHOS -- --       CBC:  Basename 06/15/11 0457 06/14/11 0528  WBC 10.0 12.2*  NEUTROABS -- --  HGB 10.1* 10.2*  HCT 29.3* 30.1*  MCV 84.4 84.3  PLT 242 230    Recent Results (from the past 240 hour(s))  CULTURE, BLOOD (ROUTINE X 2)     Status: Normal (Preliminary result)   Collection Time   06/12/11  6:06 PM      Component Value Range Status Comment   Specimen Description BLOOD LEFT ANTECUBITAL   Final    Special Requests BOTTLES DRAWN AEROBIC AND ANAEROBIC 6CC   Final    Culture NO GROWTH 2 DAYS   Final    Report Status PENDING   Incomplete   CULTURE, BLOOD (ROUTINE X 2)     Status: Normal (Preliminary result)   Collection Time   06/12/11  6:12 PM      Component Value Range Status Comment   Specimen Description BLOOD RIGHT ANTECUBITAL   Final    Special Requests BOTTLES DRAWN AEROBIC AND ANAEROBIC 6CC   Final    Culture NO GROWTH 2 DAYS   Final    Report Status PENDING   Incomplete   CULTURE, SPUTUM-ASSESSMENT     Status: Normal   Collection Time   06/14/11  4:41 PM      Component Value Range Status Comment   Specimen Description SPUTUM EXPECTORATED   Final    Special Requests NONE   Final    Sputum evaluation  Final    Value: MICROSCOPIC FINDINGS SUGGEST THAT THIS SPECIMEN IS NOT REPRESENTATIVE OF LOWER RESPIRATORY SECRETIONS. PLEASE RECOLLECT.     Results Called to:  EVANS,D. AT 1741 ON 06/14/2011 BY BAUGHAM,M.   Report Status 06/14/2011 FINAL   Final      Hospital Course: This 46 year old lady was admitted with symptoms of cough and dyspnea. Chest x-ray on admission revealed the presence of pneumonia. The cough was productive. Sputum really did not grow any organisms. Urinary Legionella and streptococcal pneumonia antigen was negative. She was treated empirically with intravenous antibiotics and supportive therapy. She did well. She now feels that her breathing and symptoms are much improved and is keen to go home. She had slight diarrhea with presence of antibiotics but not enough to warrant testing for C. difficile colitis.  Discharge Exam: Blood pressure 140/91, pulse 99, temperature 98.5 F (36.9 C), temperature source Oral, resp. rate 19, height 5\' 4"  (1.626 m), weight 84.369 kg (186 lb), last menstrual period 06/05/2011, SpO2 98.00%. She looks systemically well. Heart sounds are present and normal. Lung fields are entirely clear clinically. There is no evidence of bronchial breathing or crackles or wheezing. She is alert and orientated. Abdomen is soft and nontender.  Disposition: Home. She will need a further 7 day course of Levaquin to complete a course of antibiotics. She will follow with her primary care physician. I would suggest that she has a repeat chest x-ray in 4-6 weeks' time to make sure the pneumonia has cleared radiologically.  Discharge Orders    Future Appointments: Provider: Department: Dept Phone: Center:   06/19/2011 1:40 PM Joni Reining, NP [facility] [phone] [id number]   06/20/2011 1:45 PM Vernice Jefferson, MD [facility] [phone] [facility]     Future Orders Please Complete By Expires   Diet - low sodium heart healthy      Increase activity slowly         Follow-up Information    Follow up with Vernice Jefferson, MD .         SignedWilson Singer Pager [phone]  06/15/2011, 11:25 AM

## 2011-06-15 NOTE — Progress Notes (Signed)
Pt discharged with instructions and verbalizs understanding.  Pt left the floor via w/c in stable condition.

## 2011-06-17 LAB — CULTURE, BLOOD (ROUTINE X 2)
Culture: NO GROWTH
Culture: NO GROWTH

## 2011-06-19 ENCOUNTER — Ambulatory Visit (INDEPENDENT_AMBULATORY_CARE_PROVIDER_SITE_OTHER): Payer: Medicare Other | Admitting: Adult Health

## 2011-06-19 ENCOUNTER — Encounter: Payer: Self-pay | Admitting: Adult Health

## 2011-06-19 DIAGNOSIS — I10 Essential (primary) hypertension: Secondary | ICD-10-CM | POA: Diagnosis not present

## 2011-06-19 DIAGNOSIS — F3289 Other specified depressive episodes: Secondary | ICD-10-CM

## 2011-06-19 DIAGNOSIS — I2510 Atherosclerotic heart disease of native coronary artery without angina pectoris: Secondary | ICD-10-CM | POA: Diagnosis not present

## 2011-06-19 DIAGNOSIS — F329 Major depressive disorder, single episode, unspecified: Secondary | ICD-10-CM

## 2011-06-19 DIAGNOSIS — R079 Chest pain, unspecified: Secondary | ICD-10-CM

## 2011-06-19 NOTE — Assessment & Plan Note (Signed)
Well controlled. NO changes,

## 2011-06-19 NOTE — Assessment & Plan Note (Signed)
She requests inpt evaluation and treatment for her depression and smoking cessation. She is in contact with [facility] facility to discuss this.

## 2011-06-19 NOTE — Assessment & Plan Note (Signed)
NO changes in her medications. She is without chest pain. HR is elevated today but she has recently finished a cigarette. I have checked TSH level from recent hospitalization in January, it was WNL. Will see her in one year unless symptomatic.

## 2011-06-19 NOTE — Patient Instructions (Signed)
Your physician recommends that you schedule a follow-up appointment in: 1 year  

## 2011-06-19 NOTE — Progress Notes (Signed)
HPI: [name] is a 46 y/o patient of Dr.,Rothbart we are seeing on hospital follow-up with admission for pneumonia and chest pain. She continues on abx treatment and will be finishing up this week. She has a history of hypertension, diabetes, and ongoing tobacco abuse.  She has had no complaints of chest pain or shortness of breath. She is concerned about her HR going up during the day. She still smokes heavily. She wants to quit but suffers from depression and uses cigarettes for her nerves. She has been explained by the hospitalist at [facility] that smoking with recurrent pneumonia is becoming a life threatening situation because of recurrent hospitalizations and lung damage. She wishes to seek inpt treatment for this.  Allergies  Allergen Reactions  . Orange Shortness Of Breath and Itching  . Shrimp (Shellfish Allergy) Shortness Of Breath and Itching  . Penicillins Rash    Fever as well  . Sulfonamide Derivatives Rash    fever  . Glipizide Other (See Comments)    psychosis  . Metronidazole Swelling  . Sulfamethoxazole W/Trimethoprim Rash    Current Outpatient Prescriptions  Medication Sig Dispense Refill  . cetirizine (ZYRTEC) 10 MG tablet Take 1 tablet (10 mg total) by mouth daily.  30 tablet  3  . diphenhydrAMINE (BENADRYL) 25 MG tablet Take 50 mg by mouth daily as needed. Sleep/allergies      . ferrous gluconate (FERGON) 246 (28 FE) MG tablet Take 246 mg by mouth 2 (two) times daily.        . fluticasone (FLONASE) 50 MCG/ACT nasal spray Place 2 sprays into the nose daily.  16 g  2  . guaiFENesin-dextromethorphan (ROBITUSSIN DM) 100-10 MG/5ML syrup Take 5 mLs by mouth every 4 (four) hours as needed for cough.  118 mL  0  . hydrochlorothiazide (HYDRODIURIL) 25 MG tablet Take 1 tablet (25 mg total) by mouth daily.  31 tablet  1  . insulin glargine (LANTUS) 100 UNIT/ML injection Inject 25 Units into the skin at bedtime.  10 mL  11  . levofloxacin (LEVAQUIN) 500 MG tablet Take 1 tablet  (500 mg total) by mouth daily.  7 tablet  0  . loperamide (IMODIUM) 2 MG capsule Take 1 capsule (2 mg total) by mouth as needed for diarrhea or loose stools.  30 capsule  0  . LORazepam (ATIVAN) 0.5 MG tablet Take 0.5-1 mg by mouth 2 (two) times daily as needed. For anxiety      . lovastatin (MEVACOR) 20 MG tablet Take 1 tablet (20 mg total) by mouth at bedtime.  31 tablet  1  . metFORMIN (GLUCOPHAGE) 1000 MG tablet Take 1 tablet (1,000 mg total) by mouth 2 (two) times daily with a meal.  62 tablet  1  . metoprolol (LOPRESSOR) 50 MG tablet Take 1 tablet (50 mg total) by mouth 2 (two) times daily.  62 tablet  11  . omeprazole (PRILOSEC) 40 MG capsule Take 1 capsule (40 mg total) by mouth 2 (two) times daily.  60 capsule  0  . ondansetron (ZOFRAN) 4 MG tablet Take 1 tablet (4 mg total) by mouth every 8 (eight) hours as needed for nausea.  20 tablet  0  . potassium chloride SA (K-DUR,KLOR-CON) 20 MEQ tablet Take 20 mEq by mouth 2 (two) times daily.        . ziprasidone (GEODON) 40 MG capsule Take 40 mg by mouth every morning.       . ziprasidone (GEODON) 60 MG capsule Take 60  mg by mouth at bedtime.        . benzonatate (TESSALON) 100 MG capsule Take 2 capsules (200 mg total) by mouth 2 (two) times daily as needed for cough.  20 capsule  0    Past Medical History  Diagnosis Date  . Arteriosclerotic cardiovascular disease (ASCVD)     Minimal at cath in [facility]stress nuclear study in 8/08 with nl EF; neg stress echo in 2010  . Diabetes mellitus, type 2 2000    Onset in 2000; no insulin  . Hyperlipidemia   . Hypertension `    during treatment with Geodon  . Gastroesophageal reflux disease     Schatzki's ring  . Anemia, iron deficiency   . Alcohol abuse   . Depression   . Community acquired pneumonia 01/03/10    2011; with pleural effusion-[facility] acute resp failure  . Obesity     Past Surgical History  Procedure Date  . Dilation and curettage, diagnostic / therapeutic  1992    ZOX:WRUEAV of systems complete and found to be negative unless listed above PHYSICAL EXAM BP 125/81  Pulse 103  Ht 5\' 4"  (1.626 m)  Wt 188 lb (85.276 kg)  BMI 32.27 kg/m2  LMP 06/05/2011  General: Well developed, well nourished, in no acute distress Head: Eyes PERRLA, No xanthomas.   Normal cephalic and atramatic  Lungs: Clear bilaterally to auscultation and percussion. Heart: HRRR S1 S2, without MRG, tachycardic.  Pulses are 2+ & equal.            No carotid bruit. No JVD.  No abdominal bruits. No femoral bruits. Abdomen: Bowel sounds are positive, abdomen soft and non-tender without masses or                  Hernia's noted. Msk:  Back normal, normal gait. Normal strength and tone for age. Extremities: No clubbing, cyanosis or edema.  DP +1 Neuro: Alert and oriented X 3. Psych:  Good affect, responds appropriately  WUJ:WJXBJ rhythm rate of 98 bpm  ASSESSMENT AND PLAN

## 2011-06-20 ENCOUNTER — Encounter: Payer: Medicare Other | Admitting: Internal Medicine

## 2011-06-27 ENCOUNTER — Encounter: Payer: Medicare Other | Admitting: Internal Medicine

## 2011-06-27 DIAGNOSIS — F411 Generalized anxiety disorder: Secondary | ICD-10-CM | POA: Diagnosis not present

## 2011-06-27 DIAGNOSIS — F259 Schizoaffective disorder, unspecified: Secondary | ICD-10-CM | POA: Diagnosis not present

## 2011-07-03 ENCOUNTER — Emergency Department (HOSPITAL_COMMUNITY): Payer: Medicare Other

## 2011-07-03 ENCOUNTER — Emergency Department (HOSPITAL_COMMUNITY)
Admission: EM | Admit: 2011-07-03 | Discharge: 2011-07-03 | Disposition: A | Discharge status: Home / Self Care | Payer: Medicare Other | Attending: Emergency Medicine | Admitting: Emergency Medicine

## 2011-07-03 ENCOUNTER — Encounter (HOSPITAL_COMMUNITY): Payer: Self-pay | Admitting: *Deleted

## 2011-07-03 DIAGNOSIS — R112 Nausea with vomiting, unspecified: Secondary | ICD-10-CM | POA: Diagnosis not present

## 2011-07-03 DIAGNOSIS — F3289 Other specified depressive episodes: Secondary | ICD-10-CM | POA: Insufficient documentation

## 2011-07-03 DIAGNOSIS — E785 Hyperlipidemia, unspecified: Secondary | ICD-10-CM | POA: Diagnosis not present

## 2011-07-03 DIAGNOSIS — R05 Cough: Secondary | ICD-10-CM | POA: Insufficient documentation

## 2011-07-03 DIAGNOSIS — R197 Diarrhea, unspecified: Secondary | ICD-10-CM | POA: Diagnosis not present

## 2011-07-03 DIAGNOSIS — K219 Gastro-esophageal reflux disease without esophagitis: Secondary | ICD-10-CM | POA: Diagnosis not present

## 2011-07-03 DIAGNOSIS — R0602 Shortness of breath: Secondary | ICD-10-CM | POA: Diagnosis not present

## 2011-07-03 DIAGNOSIS — R059 Cough, unspecified: Secondary | ICD-10-CM | POA: Diagnosis not present

## 2011-07-03 DIAGNOSIS — R109 Unspecified abdominal pain: Secondary | ICD-10-CM | POA: Diagnosis not present

## 2011-07-03 DIAGNOSIS — Z794 Long term (current) use of insulin: Secondary | ICD-10-CM | POA: Insufficient documentation

## 2011-07-03 DIAGNOSIS — I10 Essential (primary) hypertension: Secondary | ICD-10-CM | POA: Insufficient documentation

## 2011-07-03 DIAGNOSIS — I2510 Atherosclerotic heart disease of native coronary artery without angina pectoris: Secondary | ICD-10-CM | POA: Insufficient documentation

## 2011-07-03 DIAGNOSIS — Z79899 Other long term (current) drug therapy: Secondary | ICD-10-CM | POA: Insufficient documentation

## 2011-07-03 DIAGNOSIS — F329 Major depressive disorder, single episode, unspecified: Secondary | ICD-10-CM | POA: Insufficient documentation

## 2011-07-03 DIAGNOSIS — E119 Type 2 diabetes mellitus without complications: Secondary | ICD-10-CM | POA: Diagnosis not present

## 2011-07-03 DIAGNOSIS — J189 Pneumonia, unspecified organism: Secondary | ICD-10-CM | POA: Diagnosis not present

## 2011-07-03 LAB — CBC
HCT: 36.3 % (ref 36.0–46.0)
Hemoglobin: 12.3 g/dL (ref 12.0–15.0)
WBC: 6.4 10*3/uL (ref 4.0–10.5)

## 2011-07-03 LAB — DIFFERENTIAL
Lymphocytes Relative: 32 % (ref 12–46)
Lymphs Abs: 2.1 10*3/uL (ref 0.7–4.0)
Monocytes Absolute: 0.4 10*3/uL (ref 0.1–1.0)
Monocytes Relative: 6 % (ref 3–12)
Neutro Abs: 3.8 10*3/uL (ref 1.7–7.7)

## 2011-07-03 LAB — COMPREHENSIVE METABOLIC PANEL
AST: 9 U/L (ref 0–37)
BUN: 8 mg/dL (ref 6–23)
CO2: 21 mEq/L (ref 19–32)
Chloride: 103 mEq/L (ref 96–112)
Creatinine, Ser: 0.54 mg/dL (ref 0.50–1.10)
GFR calc Af Amer: >90 mL/min (ref >90)
GFR calc non Af Amer: >90 mL/min (ref >90)
Glucose, Bld: 150 mg/dL — ABNORMAL HIGH (ref 70–99)
Total Bilirubin: 0.1 mg/dL — ABNORMAL LOW (ref 0.3–1.2)

## 2011-07-03 LAB — URINALYSIS, ROUTINE W REFLEX MICROSCOPIC
Glucose, UA: 100 mg/dL — AB
Ketones, ur: NEGATIVE mg/dL
Leukocytes, UA: NEGATIVE
Protein, ur: NEGATIVE mg/dL

## 2011-07-03 LAB — LIPASE, BLOOD: Lipase: 29 U/L (ref 11–59)

## 2011-07-03 MED ORDER — ONDANSETRON 8 MG PO TBDP: 8.0000 mg | ORAL_TABLET | Freq: Three times a day (TID) | ORAL | Status: DC | PRN

## 2011-07-03 MED ORDER — ONDANSETRON HCL 4 MG/2ML IJ SOLN
4.0000 mg | Freq: Once | INTRAMUSCULAR | Status: AC
  Administered 2011-07-03: 4 mg via INTRAVENOUS
  Filled 2011-07-03: qty 2

## 2011-07-03 MED ORDER — SODIUM CHLORIDE 0.9 % IV BOLUS (SEPSIS)
1000.0000 mL | Freq: Once | INTRAVENOUS | Status: AC
  Administered 2011-07-03: 1000 mL via INTRAVENOUS

## 2011-07-03 NOTE — ED Provider Notes (Signed)
History    This chart was scribed for [employer]. Rubin Payor, MD by Davonna Belling and Clarita Crane. The patient was seen in room APA11/APA11. Patient's care was started at 1113.  CSN: [id number]  Arrival date & time 07/03/11  1113   First MD Initiated Contact with Patient 07/03/11 1144      Chief Complaint  Patient presents with  . GI Problem    (Consider location/radiation/quality/duration/timing/severity/associated sxs/prior treatment) HPI  [name] is a 46 y.o. female who presents to the Emergency Department complaining of intermittent nausea, vomiting, and diarrhea onset 3 days ago with associated coughing and dehydration and persistent since. Patient notes experiencing associated mild abdominal pain as well. States she has been vomiting approximately once per night. Patient was diagnosed with pneumonia 3 weeks ago and was advised to return to have repeat CXR performed at this time. Denies fever and decreased appetite. H/o HLD, HTN,GERD, and Diabetes.    Past Medical History  Diagnosis Date  . Arteriosclerotic cardiovascular disease (ASCVD)     Minimal at cath in [facility].stress nuclear study in 8/08 with nl EF; neg stress echo in 2010  . Diabetes mellitus, type 2 2000    Onset in 2000; no insulin  . Hyperlipidemia   . Hypertension `    during treatment with Geodon  . Gastroesophageal reflux disease     Schatzki's ring  . Anemia, iron deficiency   . Alcohol abuse   . Depression   . Community acquired pneumonia 01/03/10    2011; with pleural effusion-[facility] acute resp failure  . Obesity     Past Surgical History  Procedure Date  . Dilation and curettage, diagnostic / therapeutic 1992    Family History  Problem Relation Age of Onset  . Colon cancer Neg Hx   . Hypertension Mother   . Stroke Father   . Heart disease Sister     History  Substance Use Topics  . Smoking status: Current Everyday Smoker -- 1.0 packs/day for 30 years    Types:  Cigarettes  . Smokeless tobacco: Not on file   Comment: wants to stop but nerves too bad at present - offered QUIT line info  . Alcohol Use: 0.0 oz/week     occasional    OB History    Grav Para Term Preterm Abortions TAB SAB Ect Mult Living                  Review of Systems 10 Systems reviewed and are negative for acute change except as noted in the HPI. Allergies  Orange; Shrimp; Penicillins; Sulfonamide derivatives; Glipizide; Metronidazole; and Sulfamethoxazole w/trimethoprim  Home Medications   Current Outpatient Rx  Name Route Sig Dispense Refill  . DIPHENHYDRAMINE HCL 25 MG PO TABS Oral Take 50 mg by mouth daily as needed. Sleep/allergies    . CETIRIZINE HCL 10 MG PO TABS Oral Take 1 tablet (10 mg total) by mouth daily. 30 tablet 3  . FLUTICASONE PROPIONATE 50 MCG/ACT NA SUSP Nasal Place 2 sprays into the nose daily. 16 g 2  . HYDROCHLOROTHIAZIDE 25 MG PO TABS Oral Take 1 tablet (25 mg total) by mouth daily. 31 tablet 1  . INSULIN GLARGINE 100 UNIT/ML [place] SOLN Subcutaneous Inject 25 Units into the skin at bedtime. 10 mL 11  . LORAZEPAM 0.5 MG PO TABS Oral Take 0.5-1 mg by mouth 2 (two) times daily as needed. For anxiety    . LOVASTATIN 20 MG PO TABS Oral Take 1  tablet (20 mg total) by mouth at bedtime. 31 tablet 1  . METFORMIN HCL 1000 MG PO TABS Oral Take 1 tablet (1,000 mg total) by mouth 2 (two) times daily with a meal. 62 tablet 1  . METOPROLOL TARTRATE 50 MG PO TABS Oral Take 1 tablet (50 mg total) by mouth 2 (two) times daily. 62 tablet 11  . OMEPRAZOLE 40 MG PO CPDR Oral Take 1 capsule (40 mg total) by mouth 2 (two) times daily. 60 capsule 0  . ONDANSETRON 8 MG PO TBDP Oral Take 1 tablet (8 mg total) by mouth every 8 (eight) hours as needed for nausea. 20 tablet 0  . POTASSIUM CHLORIDE CRYS ER 20 MEQ PO TBCR Oral Take 20 mEq by mouth 2 (two) times daily.      Marland Kitchen ZIPRASIDONE HCL 40 MG PO CAPS Oral Take 40 mg by mouth every morning.     Marland Kitchen ZIPRASIDONE HCL 60 MG PO CAPS  Oral Take 60 mg by mouth at bedtime.        BP 129/75  Pulse 75  Temp(Src) 98 F (36.7 C) (Oral)  Resp 20  Ht 5\' 4"  (1.626 m)  Wt 186 lb (84.369 kg)  BMI 31.93 kg/m2  SpO2 100%  LMP 06/04/2011  Physical Exam  Nursing note and vitals reviewed. Constitutional: She is oriented to person, place, and time. She appears well-developed and well-nourished. No distress.  HENT:  Head: Normocephalic and atraumatic.  Eyes: EOM are normal. Pupils are equal, round, and reactive to light.  Neck: Neck supple. No tracheal deviation present.  Cardiovascular: Normal rate.  Exam reveals no gallop and no friction rub.   No murmur heard. Pulmonary/Chest: Effort normal. No respiratory distress. She has no wheezes.  Abdominal: Soft. Bowel sounds are normal. She exhibits no distension. There is no tenderness. There is no rebound and no guarding.  Musculoskeletal: Normal range of motion. She exhibits no edema.  Neurological: She is alert and oriented to person, place, and time. No sensory deficit.  Skin: Skin is warm and dry.  Psychiatric: She has a normal mood and affect. Her behavior is normal.    ED Course  Procedures (including critical care time) DIAGNOSTIC STUDIES: Oxygen Saturation is 100% on room air, normal by my interpretation.    COORDINATION OF CARE: 11:50AM-Patient informed of current plan for treatment and evaluation and agrees with plan at this time.    Labs Reviewed  COMPREHENSIVE METABOLIC PANEL - Abnormal; Notable for the following:    Glucose, Bld 150 (*)    Albumin 3.1 (*)    Total Bilirubin 0.1 (*)    All other components within normal limits  URINALYSIS, ROUTINE W REFLEX MICROSCOPIC - Abnormal; Notable for the following:    Specific Gravity, Urine <1.005 (*)    Glucose, UA 100 (*)    Hgb urine dipstick LARGE (*)    All other components within normal limits  CBC  DIFFERENTIAL  LIPASE, BLOOD  URINE MICROSCOPIC-ADD ON   Dg Chest 2 View  07/03/2011  *RADIOLOGY REPORT*   Clinical Data: Cough, recent history of pneumonia  CHEST - 2 VIEW  Comparison: Chest x-ray of 06/12/2011  Findings: The previous right lower lobe and right middle lobe pneumonia has cleared.  No active infiltrate or effusion is seen. Mediastinal contours are stable.  The heart is within normal limits in size.  No bony abnormality is seen.  IMPRESSION: No active lung disease.  Clearing of right lung pneumonia  Original Report Authenticated By: Colon Flattery.  Gery Pray, M.D.     1. Nausea vomiting and diarrhea       MDM  Nausea vomiting diarrhea for 4 days. Patient states she feels dehydrated. She also stated that she needed a followup chest x-ray after a previous pneumonia. X-rays cleared the pneumonia. Patient feels better after treatment and has tolerated orals. She'll be discharged home with Zofran.      I personally performed the services described in this documentation, which was scribed in my presence. The recorded information has been reviewed and considered.     Juliet Rude. Rubin Payor, MD 07/03/11 1437

## 2011-07-03 NOTE — ED Notes (Signed)
Pt c/o nausea, vomiting and diarrhea x 4 days. States that she is unable to keep anything down.

## 2011-07-03 NOTE — Discharge Instructions (Signed)
Diet for Diarrhea, Adult Having frequent, runny stools (diarrhea) has many causes. Diarrhea may be caused or worsened by food or drink. Diarrhea may be relieved by changing your diet. IF YOU ARE NOT TOLERATING SOLID FOODS:  Drink enough water and fluids to keep your urine clear or pale yellow.   Avoid sugary drinks and sodas as well as milk-based beverages.   Avoid beverages containing caffeine and alcohol.   You may try rehydrating beverages. You can make your own by following this recipe:    tsp table salt.    tsp baking soda.   ? tsp salt substitute (potassium chloride).   1 tbs + 1 tsp sugar.   1 qt water.  As your stools become more solid, you can start eating solid foods. Add foods one at a time. If a certain food causes your diarrhea to get worse, avoid that food and try other foods. A low fiber, low-fat, and lactose-free diet is recommended. Small, frequent meals may be better tolerated.  Starches  Allowed:  White, French, and pita breads, plain rolls, buns, bagels. Plain muffins, matzo. Soda, saltine, or graham crackers. Pretzels, melba toast, zwieback. Cooked cereals made with water: cornmeal, farina, cream cereals. Dry cereals: refined corn, wheat, rice. Potatoes prepared any way without skins, refined macaroni, spaghetti, noodles, refined rice.   Avoid:  Bread, rolls, or crackers made with whole wheat, multi-grains, rye, bran seeds, nuts, or coconut. Corn tortillas or taco shells. Cereals containing whole grains, multi-grains, bran, coconut, nuts, or raisins. Cooked or dry oatmeal. Coarse wheat cereals, granola. Cereals advertised as "high-fiber." Potato skins. Whole grain pasta, wild or brown rice. Popcorn. Sweet potatoes/yams. Sweet rolls, doughnuts, waffles, pancakes, sweet breads.  Vegetables  Allowed: Strained tomato and vegetable juices. Most well-cooked and canned vegetables without seeds. Fresh: Tender lettuce, cucumber without the skin, cabbage, spinach, bean  sprouts.   Avoid: Fresh, cooked, or canned: Artichokes, baked beans, beet greens, broccoli, Brussels sprouts, corn, kale, legumes, peas, sweet potatoes. Cooked: Green or red cabbage, spinach. Avoid large servings of any vegetables, because vegetables shrink when cooked, and they contain more fiber per serving than fresh vegetables.  Fruit  Allowed: All fruit juices except prune juice. Cooked or canned: Apricots, applesauce, cantaloupe, cherries, fruit cocktail, grapefruit, grapes, kiwi, mandarin oranges, peaches, pears, plums, watermelon. Fresh: Apples without skin, ripe banana, grapes, cantaloupe, cherries, grapefruit, peaches, oranges, plums. Keep servings limited to  cup or 1 piece.   Avoid: Fresh: Apple with skin, apricots, mango, pears, raspberries, strawberries. Prune juice, stewed or dried prunes. Dried fruits, raisins, dates. Large servings of all fresh fruits.  Meat and Meat Substitutes  Allowed: Ground or well-cooked tender beef, ham, veal, lamb, pork, or poultry. Eggs, plain cheese. Fish, oysters, shrimp, lobster, other seafoods. Liver, organ meats.   Avoid: Tough, fibrous meats with gristle. Peanut butter, smooth or chunky. Cheese, nuts, seeds, legumes, dried peas, beans, lentils.  Milk  Allowed: Yogurt, lactose-free milk, kefir, drinkable yogurt, buttermilk, soy milk.   Avoid: Milk, chocolate milk, beverages made with milk, such as milk shakes.  Soups  Allowed: Bouillon, broth, or soups made from allowed foods. Any strained soup.   Avoid: Soups made from vegetables that are not allowed, cream or milk-based soups.  Desserts and Sweets  Allowed: Sugar-free gelatin, sugar-free frozen ice pops made without sugar alcohol.   Avoid: Plain cakes and cookies, pie made with allowed fruit, pudding, custard, cream pie. Gelatin, fruit, ice, sherbet, frozen ice pops. Ice cream, ice milk without nuts. Plain hard candy,   honey, jelly, molasses, syrup, sugar, chocolate syrup, gumdrops,  marshmallows.  Fats and Oils  Allowed: Avoid any fats and oils.   Avoid: Seeds, nuts, olives, avocados. Margarine, butter, cream, mayonnaise, salad oils, plain salad dressings made from allowed foods. Plain gravy, crisp bacon without rind.  Beverages  Allowed: Water, decaffeinated teas, oral rehydration solutions, sugar-free beverages.   Avoid: Fruit juices, caffeinated beverages (coffee, tea, soda or pop), alcohol, sports drinks, or lemon-lime soda or pop.  Condiments  Allowed: Ketchup, mustard, horseradish, vinegar, cream sauce, cheese sauce, cocoa powder. Spices in moderation: allspice, basil, bay leaves, celery powder or leaves, cinnamon, cumin powder, curry powder, ginger, mace, marjoram, onion or garlic powder, oregano, paprika, parsley flakes, ground pepper, rosemary, sage, savory, tarragon, thyme, turmeric.   Avoid: Coconut, honey.  Weight Monitoring: Weigh yourself every day. You should weigh yourself in the morning after you urinate and before you eat breakfast. Wear the same amount of clothing when you weigh yourself. Record your weight daily. Bring your recorded weights to your clinic visits. Tell your caregiver right away if you have gained 3 lb/1.4 kg or more in 1 day, 5 lb/2.3 kg in a week, or whatever amount you were told to report. SEEK IMMEDIATE MEDICAL CARE IF:   You are unable to keep fluids down.   You start to throw up (vomit) or diarrhea keeps coming back (persistent).   Abdominal pain develops, increases, or can be felt in one place (localizes).   You have an oral temperature above 102 F (38.9 C), not controlled by medicine.   Diarrhea contains blood or mucus.   You develop excessive weakness, dizziness, fainting, or extreme thirst.  MAKE SURE YOU:   Understand these instructions.   Will watch your condition.   Will get help right away if you are not doing well or get worse.  Document Released: 06/23/2003 Document Revised: 03/22/2011 Document Reviewed:  10/14/2008 [employer] Patient Information 2012 [employer].Nausea and Vomiting Nausea is a sick feeling that often comes before throwing up (vomiting). Vomiting is a reflex where stomach contents come out of your mouth. Vomiting can cause severe loss of body fluids (dehydration). Children and elderly adults can become dehydrated quickly, especially if they also have diarrhea. Nausea and vomiting are symptoms of a condition or disease. It is important to find the cause of your symptoms. CAUSES   Direct irritation of the stomach lining. This irritation can result from increased acid production (gastroesophageal reflux disease), infection, food poisoning, taking certain medicines (such as nonsteroidal anti-inflammatory drugs), alcohol use, or tobacco use.   Signals from the brain.These signals could be caused by a headache, heat exposure, an inner ear disturbance, increased pressure in the brain from injury, infection, a tumor, or a concussion, pain, emotional stimulus, or metabolic problems.   An obstruction in the gastrointestinal tract (bowel obstruction).   Illnesses such as diabetes, hepatitis, gallbladder problems, appendicitis, kidney problems, cancer, sepsis, atypical symptoms of a heart attack, or eating disorders.   Medical treatments such as chemotherapy and radiation.   Receiving medicine that makes you sleep (general anesthetic) during surgery.  DIAGNOSIS Your caregiver may ask for tests to be done if the problems do not improve after a few days. Tests may also be done if symptoms are severe or if the reason for the nausea and vomiting is not clear. Tests may include:  Urine tests.   Blood tests.   Stool tests.   Cultures (to look for evidence of infection).   X-rays or other imaging   studies.  Test results can help your caregiver make decisions about treatment or the need for additional tests. TREATMENT You need to stay well hydrated. Drink frequently but in small  amounts.You may wish to drink water, sports drinks, clear broth, or eat frozen ice pops or gelatin dessert to help stay hydrated.When you eat, eating slowly may help prevent nausea.There are also some antinausea medicines that may help prevent nausea. HOME CARE INSTRUCTIONS   Take all medicine as directed by your caregiver.   If you do not have an appetite, do not force yourself to eat. However, you must continue to drink fluids.   If you have an appetite, eat a normal diet unless your caregiver tells you differently.   Eat a variety of complex carbohydrates (rice, wheat, potatoes, bread), lean meats, yogurt, fruits, and vegetables.   Avoid high-fat foods because they are more difficult to digest.   Drink enough water and fluids to keep your urine clear or pale yellow.   If you are dehydrated, ask your caregiver for specific rehydration instructions. Signs of dehydration may include:   Severe thirst.   Dry lips and mouth.   Dizziness.   Dark urine.   Decreasing urine frequency and amount.   Confusion.   Rapid breathing or pulse.  SEEK IMMEDIATE MEDICAL CARE IF:   You have blood or brown flecks (like coffee grounds) in your vomit.   You have black or bloody stools.   You have a severe headache or stiff neck.   You are confused.   You have severe abdominal pain.   You have chest pain or trouble breathing.   You do not urinate at least once every 8 hours.   You develop cold or clammy skin.   You continue to vomit for longer than 24 to 48 hours.   You have a fever.  MAKE SURE YOU:   Understand these instructions.   Will watch your condition.   Will get help right away if you are not doing well or get worse.  Document Released: 04/02/2005 Document Revised: 03/22/2011 Document Reviewed: 08/30/2010 [employer] Patient Information 2012 [employer]. 

## 2011-07-03 NOTE — ED Notes (Signed)
Pt sleeping with no complaints.

## 2011-07-04 ENCOUNTER — Telehealth: Payer: Self-pay | Admitting: Internal Medicine

## 2011-07-04 NOTE — Telephone Encounter (Signed)
[name] called to discuss her chronic indigestion. She complained of nausea and vomiting for which she was seen in ER on 3/19 and was sent back home. She was told by her [facility] physician that she would benefit from a gastric emptying study and wants to know whether she would be able to get it if she comes to ED today. Patient has an appointment with [facility] on Friday and was told to keep the appointment. She was also requested to keep up with oral hydration as instructed by the ER physician and was told to come to ED if she is unable to keep fluids down. She agreed with the plan.

## 2011-07-05 ENCOUNTER — Other Ambulatory Visit: Payer: Self-pay | Admitting: *Deleted

## 2011-07-05 DIAGNOSIS — K219 Gastro-esophageal reflux disease without esophagitis: Secondary | ICD-10-CM

## 2011-07-05 MED ORDER — OMEPRAZOLE 20 MG PO CPDR: 20.0000 mg | DELAYED_RELEASE_CAPSULE | Freq: Two times a day (BID) | ORAL | Status: DC

## 2011-07-05 NOTE — Telephone Encounter (Signed)
Pharmacy states insurance will not cover omeprazole 40 mg.  Can you change to 20 mg take 2 bid?  # 120?

## 2011-07-06 ENCOUNTER — Encounter (HOSPITAL_COMMUNITY): Payer: Self-pay | Admitting: *Deleted

## 2011-07-06 ENCOUNTER — Encounter: Payer: Self-pay | Admitting: Internal Medicine

## 2011-07-06 ENCOUNTER — Emergency Department (HOSPITAL_COMMUNITY)
Admission: EM | Admit: 2011-07-06 | Discharge: 2011-07-07 | Disposition: A | Discharge status: Home / Self Care | Payer: Medicare Other | Attending: Emergency Medicine | Admitting: Emergency Medicine

## 2011-07-06 ENCOUNTER — Ambulatory Visit (INDEPENDENT_AMBULATORY_CARE_PROVIDER_SITE_OTHER): Payer: Medicare Other | Admitting: Internal Medicine

## 2011-07-06 VITALS — BP 136/85 | HR 89 | Temp 96.7°F | Ht 65.0 in | Wt 192.3 lb

## 2011-07-06 DIAGNOSIS — R112 Nausea with vomiting, unspecified: Secondary | ICD-10-CM | POA: Diagnosis not present

## 2011-07-06 DIAGNOSIS — Z79899 Other long term (current) drug therapy: Secondary | ICD-10-CM

## 2011-07-06 DIAGNOSIS — I10 Essential (primary) hypertension: Secondary | ICD-10-CM | POA: Insufficient documentation

## 2011-07-06 DIAGNOSIS — R1084 Generalized abdominal pain: Secondary | ICD-10-CM | POA: Diagnosis not present

## 2011-07-06 DIAGNOSIS — F329 Major depressive disorder, single episode, unspecified: Secondary | ICD-10-CM | POA: Diagnosis not present

## 2011-07-06 DIAGNOSIS — E119 Type 2 diabetes mellitus without complications: Secondary | ICD-10-CM | POA: Diagnosis not present

## 2011-07-06 DIAGNOSIS — K219 Gastro-esophageal reflux disease without esophagitis: Secondary | ICD-10-CM | POA: Insufficient documentation

## 2011-07-06 DIAGNOSIS — R454 Irritability and anger: Secondary | ICD-10-CM | POA: Diagnosis not present

## 2011-07-06 DIAGNOSIS — G44209 Tension-type headache, unspecified, not intractable: Secondary | ICD-10-CM | POA: Insufficient documentation

## 2011-07-06 DIAGNOSIS — E785 Hyperlipidemia, unspecified: Secondary | ICD-10-CM | POA: Diagnosis not present

## 2011-07-06 DIAGNOSIS — R569 Unspecified convulsions: Secondary | ICD-10-CM | POA: Diagnosis not present

## 2011-07-06 DIAGNOSIS — R5381 Other malaise: Secondary | ICD-10-CM | POA: Diagnosis not present

## 2011-07-06 DIAGNOSIS — I2510 Atherosclerotic heart disease of native coronary artery without angina pectoris: Secondary | ICD-10-CM | POA: Diagnosis not present

## 2011-07-06 DIAGNOSIS — Z794 Long term (current) use of insulin: Secondary | ICD-10-CM | POA: Insufficient documentation

## 2011-07-06 DIAGNOSIS — H53149 Visual discomfort, unspecified: Secondary | ICD-10-CM | POA: Diagnosis not present

## 2011-07-06 DIAGNOSIS — F3289 Other specified depressive episodes: Secondary | ICD-10-CM | POA: Insufficient documentation

## 2011-07-06 DIAGNOSIS — F419 Anxiety disorder, unspecified: Secondary | ICD-10-CM

## 2011-07-06 DIAGNOSIS — R51 Headache: Secondary | ICD-10-CM | POA: Diagnosis not present

## 2011-07-06 LAB — GLUCOSE, CAPILLARY: Glucose-Capillary: 77 mg/dL (ref 70–99)

## 2011-07-06 MED ORDER — OMEPRAZOLE 20 MG PO CPDR: 20.0000 mg | DELAYED_RELEASE_CAPSULE | Freq: Two times a day (BID) | ORAL | Status: DC

## 2011-07-06 MED ORDER — ONDANSETRON 4 MG PO TBDP: 4.0000 mg | ORAL_TABLET | Freq: Three times a day (TID) | ORAL | Status: DC | PRN

## 2011-07-06 MED ORDER — LORAZEPAM 0.5 MG PO TABS: 0.5000 mg | ORAL_TABLET | Freq: Two times a day (BID) | ORAL | Status: DC | PRN

## 2011-07-06 MED ORDER — IBUPROFEN 800 MG PO TABS
ORAL_TABLET | ORAL | Status: AC
  Administered 2011-07-06: 800 mg via ORAL
  Filled 2011-07-06: qty 1

## 2011-07-06 MED ORDER — IBUPROFEN 800 MG PO TABS
800.0000 mg | ORAL_TABLET | Freq: Once | ORAL | Status: AC
  Administered 2011-07-06: 800 mg via ORAL

## 2011-07-06 MED ORDER — SODIUM CHLORIDE 0.9 % IV BOLUS (SEPSIS)
1000.0000 mL | Freq: Once | INTRAVENOUS | Status: AC
  Administered 2011-07-06: 1000 mL via INTRAVENOUS

## 2011-07-06 NOTE — Assessment & Plan Note (Signed)
Patient reports increasing irritability and anger; denies homicidal ideation. Her symptoms may reflect her underlying depression and anxiety, or may represent a new mood disorder.  She denies any recent traumatic or stressful events. Advised her to continue all of her current psychiatric medications and to call her provider at [facility] to discuss her new symptoms. Patient agrees to do this.

## 2011-07-06 NOTE — Patient Instructions (Addendum)
Schedule an appointment with Dr. Milbert Coulter in April or May. We will schedule you for a gastric emptying study. This is a test to see if you have gastroparesis that is causing you to have nausea and vomiting Call [facility] and schedule an appointment to be seen as soon as possible to talk about your anger Keep taking all of your medicine as directed

## 2011-07-06 NOTE — ED Notes (Signed)
Headache that started tonight with n/v/d.  C/o blurry vision with light and sound sensitivity.  Speech clear at this time.  No weakness noted.

## 2011-07-06 NOTE — ED Notes (Signed)
Pt reports needing something for pain or she is going to self medicate with motrin.  Motrin given for pain per pt request.  nad noted.

## 2011-07-06 NOTE — Assessment & Plan Note (Signed)
Patient has had intermittent bouts of nausea and vomiting since January. She admits to a sensation of fullness after eating that persists for a few hours. Her recent bout of nausea, vomiting, and diarrhea is resolving. I will attempt to prescribe her orally dissolving Zofran 4 mg tablets but I'm unsure if her insurance provider will cover the cost of this medication. She states she has a supply of the "normal" pills; informed patient that taking the "normal" pills will probably be sufficient for now as she is able to tolerate oral intake.  Given her underlying DM, persistent/recurrent symptoms, and sensation of early satiety, will send her for a gastric emptying study to evaluate for gastroparesis.  If this study is unrevealing, she may benefit from EGD to evaluate for esophagitis, PUD, etc given her hx of GERD.

## 2011-07-06 NOTE — Progress Notes (Signed)
Subjective:     Patient ID: [name], female   DOB: 04-Jan-1966, 46 y.o.   MRN: [id number]  HPI  Pt is a 46 y/o F here for ER follow up.  She was seen in 44/19/13 for c/o nausea, vomiting, diarrhea.  She reports continued nausea, vomiting, and diarrhea but she states her symptoms have calmed down.  She is able to tolerate food.  She was unable to pick up the anti-emetic prescribed at the ER; her insurance company would not pay for the dissolving 8 mg tablet of Zofran.  She has had intermittent problems with nausea and vomiting since January.  She reports increased problems with anger. States she is easily irritable and is noticing that her temper seems worse.  She denies homicidal ideation or suicidal ideation.  States she is taking all of her depression/anxiety medications as directed.    Review of Systems Constitutional: Negative for fever, chills, diaphoresis, activity change, appetite change, fatigue and unexpected weight change.  HENT: Negative for hearing loss, congestion and neck stiffness.   Eyes: Negative for photophobia, pain and visual disturbance.  Respiratory: Negative for cough, chest tightness, shortness of breath and wheezing.   Cardiovascular: Negative for chest pain and palpitations.  Gastrointestinal: Negative for abdominal pain, blood in stool and anal bleeding.  Genitourinary: Negative for dysuria, hematuria and difficulty urinating.  Musculoskeletal: Negative for joint swelling.  Neurological: Negative for dizziness, syncope, speech difficulty, weakness, numbness and headaches.      Objective:   Physical Exam VItal signs reviewed and stable. GEN: No apparent distress.  Alert and oriented x 3.  Pleasant, conversant, and cooperative to exam. HEENT: head is autraumatic and normocephalic.  Neck is supple without palpable masses or lymphadenopathy.  No JVD or carotid bruits.  Vision intact.  EOMI.  PERRLA.  Sclerae anicteric.  Conjunctivae without pallor or injection.  Mucous membranes are moist.  Oropharynx is without erythema, exudates, or other abnormal lesions.   RESP:  Lungs are clear to ascultation bilaterally with good air movement.  No wheezes, ronchi, or rubs. CARDIOVASCULAR: regular rate, normal rhythm.  Clear S1, S2, no murmurs, gallops, or rubs. ABDOMEN: soft, non-tender, non-distended.  Bowels sounds present in all quadrants and normoactive.  No palpable masses. EXT: warm and dry.  Peripheral pulses equal, intact, and +2 globally.  No clubbing or cyanosis.  No edema in bilateral lower extremities. SKIN: warm and dry with normal turgor.  No rashes or abnormal lesions observed. NEURO: CN II-XII grossly intact.  Muscle strength +5/5 in bilateral upper and lower extremities.  Sensation is grossly intact.  No focal deficit.     Assessment/Plan:

## 2011-07-07 ENCOUNTER — Emergency Department (HOSPITAL_COMMUNITY): Payer: Medicare Other

## 2011-07-07 DIAGNOSIS — R51 Headache: Secondary | ICD-10-CM | POA: Diagnosis not present

## 2011-07-07 LAB — URINALYSIS, ROUTINE W REFLEX MICROSCOPIC
Ketones, ur: NEGATIVE mg/dL
Leukocytes, UA: NEGATIVE
Nitrite: NEGATIVE
Protein, ur: NEGATIVE mg/dL

## 2011-07-07 LAB — DIFFERENTIAL
Basophils Absolute: 0 10*3/uL (ref 0.0–0.1)
Eosinophils Absolute: 0.5 10*3/uL (ref 0.0–0.7)
Eosinophils Relative: 5 % (ref 0–5)

## 2011-07-07 LAB — URINE MICROSCOPIC-ADD ON

## 2011-07-07 LAB — COMPREHENSIVE METABOLIC PANEL
ALT: 11 U/L (ref 0–35)
AST: 11 U/L (ref 0–37)
CO2: 24 mEq/L (ref 19–32)
Chloride: 99 mEq/L (ref 96–112)
Creatinine, Ser: 0.71 mg/dL (ref 0.50–1.10)
GFR calc non Af Amer: >90 mL/min (ref >90)
Total Bilirubin: 0.1 mg/dL — ABNORMAL LOW (ref 0.3–1.2)

## 2011-07-07 LAB — CBC
MCH: 28.6 pg (ref 26.0–34.0)
MCHC: 33.8 g/dL (ref 30.0–36.0)
MCV: 84.8 fL (ref 78.0–100.0)
Platelets: 310 10*3/uL (ref 150–400)
RDW: 14 % (ref 11.5–15.5)

## 2011-07-07 MED ORDER — FAMOTIDINE IN NACL 20-0.9 MG/50ML-% IV SOLN
20.0000 mg | Freq: Once | INTRAVENOUS | Status: AC
  Administered 2011-07-07: 20 mg via INTRAVENOUS
  Filled 2011-07-07: qty 50

## 2011-07-07 MED ORDER — ONDANSETRON HCL 4 MG/2ML IJ SOLN
4.0000 mg | Freq: Once | INTRAMUSCULAR | Status: AC
  Administered 2011-07-07: 4 mg via INTRAVENOUS
  Filled 2011-07-07: qty 2

## 2011-07-07 MED ORDER — ONDANSETRON HCL 8 MG PO TABS: 8.0000 mg | ORAL_TABLET | Freq: Three times a day (TID) | ORAL | Status: AC | PRN

## 2011-07-07 MED ORDER — ONDANSETRON HCL 4 MG/2ML IJ SOLN
INTRAMUSCULAR | Status: AC
  Filled 2011-07-07: qty 2

## 2011-07-07 MED ORDER — IBUPROFEN 800 MG PO TABS: 800.0000 mg | ORAL_TABLET | Freq: Three times a day (TID) | ORAL | Status: DC | PRN

## 2011-07-07 NOTE — ED Provider Notes (Addendum)
History     CSN: [id number]  Arrival date & time 07/06/11  2233   First MD Initiated Contact with Patient 07/07/11 0047      Chief Complaint  Patient presents with  . Headache    (Consider location/radiation/quality/duration/timing/severity/associated sxs/prior treatment) HPI Comments: The patient has a history of a enigmatic and persistent nausea and vomiting that is currently being evaluated as an outpatient with upcoming gastric emptying study and endoscopy to be performed. This is not a new symptom and is unchanged. The patient had developed a mild headache earlier in the afternoon yesterday that persisted through till the evening and became more severe while vomiting just prior to arrival. The patient had no loss of consciousness. At the time of evaluation, the emergency department RN had given the patient ibuprofen, albeit without my knowledge, however it has helped the pain significantly. The patient's headache is currently resolved and her neurologic exam is nonfocal without any findings to suggest acute stroke, intracranial hemorrhage, or space occupying lesion. However, because the patient doesn't have a history of recurrent headaches and this one was reported to have been severe, I will obtain a head CT to evaluate for these possibilities further.  Patient is a 46 y.o. female presenting with headaches. The history is provided by the patient and medical records.  Headache  This is a new problem. The current episode started 6 to 12 hours ago. The problem occurs constantly (The patient reports a mild generalized bifrontal and occipital headache, dull and throbbing, onset earlier in the afternoon yesterday, that gradually worsened and became severe this evening during an episode of vomiting.). The problem has been resolved (The headache is now resolved after the nurse in the emergency department gave the patient ibuprofen, albeit without my knowledge. It seems to have helped. The patient  has a known history of persistent nausea and vomiting and generalized abdominal discomfor). The headache is associated with bright light (The patient has a known history of persistent and recurrent nausea and vomiting with mild generalized abdominal pain that is being investigated as an outpatient, and the nausea that she had and vomiting when the headache became more severe is not new). The pain is located in the bilateral, frontal and occipital region. The quality of the pain is described as dull and throbbing. Pain scale: At worst, the pain is reported to have been severe in intensity. It is now resolved. The patient is experiencing no pain. The pain does not radiate. Associated symptoms include malaise/fatigue, nausea and vomiting. Pertinent negatives include no anorexia, no fever, no chest pressure, no near-syncope, no orthopnea, no palpitations, no syncope and no shortness of breath. She has tried NSAIDs for the symptoms. The treatment provided significant relief.    Past Medical History  Diagnosis Date  . Arteriosclerotic cardiovascular disease (ASCVD)     Minimal at cath in [facility].stress nuclear study in 8/08 with nl EF; neg stress echo in 2010  . Diabetes mellitus, type 2 2000    Onset in 2000; no insulin  . Hyperlipidemia   . Hypertension `    during treatment with Geodon  . Gastroesophageal reflux disease     Schatzki's ring  . Anemia, iron deficiency   . Alcohol abuse   . Depression   . Community acquired pneumonia 01/03/10    2011; with pleural effusion-[facility] acute resp failure  . Obesity     Past Surgical History  Procedure Date  . Dilation and curettage, diagnostic / therapeutic 1992  Family History  Problem Relation Age of Onset  . Colon cancer Neg Hx   . Hypertension Mother   . Stroke Father   . Heart disease Sister     History  Substance Use Topics  . Smoking status: Current Everyday Smoker -- 1.0 packs/day for 30 years    Types:  Cigarettes  . Smokeless tobacco: Not on file   Comment: Sometimes she smokes more; up to 2 packs/day.  . Alcohol Use: 0.0 oz/week     occasional    OB History    Grav Para Term Preterm Abortions TAB SAB Ect Mult Living                  Review of Systems  Constitutional: Positive for malaise/fatigue. Negative for fever, chills, diaphoresis and appetite change.  HENT: Negative for hearing loss, ear pain, congestion, sore throat, facial swelling, rhinorrhea, trouble swallowing, neck pain, neck stiffness, dental problem, sinus pressure and tinnitus.   Eyes: Positive for photophobia. Negative for pain, discharge, redness and visual disturbance.  Respiratory: Negative.  Negative for shortness of breath.   Cardiovascular: Negative.  Negative for palpitations, orthopnea, syncope and near-syncope.  Gastrointestinal: Positive for nausea and vomiting. Negative for abdominal pain, diarrhea and anorexia.  Genitourinary: Negative.   Musculoskeletal: Negative for back pain.  Skin: Negative for rash.  Neurological: Positive for headaches. Negative for dizziness, seizures, syncope, light-headedness and numbness.  Hematological: Does not bruise/bleed easily.  Psychiatric/Behavioral: Negative for confusion and decreased concentration.    Allergies  Orange; Shrimp; Penicillins; Sulfonamide derivatives; Glipizide; Metronidazole; and Sulfamethoxazole w/trimethoprim  Home Medications   Current Outpatient Rx  Name Route Sig Dispense Refill  . CETIRIZINE HCL 10 MG PO TABS Oral Take 1 tablet (10 mg total) by mouth daily. 30 tablet 3  . DIPHENHYDRAMINE HCL 25 MG PO TABS Oral Take 50 mg by mouth daily as needed. Sleep/allergies    . FLUTICASONE PROPIONATE 50 MCG/ACT NA SUSP Nasal Place 2 sprays into the nose daily. 16 g 2  . HYDROCHLOROTHIAZIDE 25 MG PO TABS Oral Take 1 tablet (25 mg total) by mouth daily. 31 tablet 1  . INSULIN GLARGINE 100 UNIT/ML [place] SOLN Subcutaneous Inject 25 Units into the skin at  bedtime. 10 mL 11  . LORAZEPAM 0.5 MG PO TABS Oral Take 1-2 tablets (0.5-1 mg total) by mouth 2 (two) times daily as needed. For anxiety 90 tablet 0  . LOVASTATIN 20 MG PO TABS Oral Take 1 tablet (20 mg total) by mouth at bedtime. 31 tablet 1  . METFORMIN HCL 1000 MG PO TABS Oral Take 1 tablet (1,000 mg total) by mouth 2 (two) times daily with a meal. 62 tablet 1  . METOPROLOL TARTRATE 50 MG PO TABS Oral Take 1 tablet (50 mg total) by mouth 2 (two) times daily. 62 tablet 11  . OMEPRAZOLE 20 MG PO CPDR Oral Take 1 capsule (20 mg total) by mouth 2 (two) times daily. 60 capsule 11  . POTASSIUM CHLORIDE CRYS ER 20 MEQ PO TBCR Oral Take 20 mEq by mouth 2 (two) times daily.      Marland Kitchen ZIPRASIDONE HCL 40 MG PO CAPS Oral Take 40 mg by mouth every morning.     Marland Kitchen ZIPRASIDONE HCL 60 MG PO CAPS Oral Take 60 mg by mouth at bedtime.      Marland Kitchen ONDANSETRON 4 MG PO TBDP Oral Take 1 tablet (4 mg total) by mouth every 8 (eight) hours as needed for nausea. 20 tablet 0  BP 132/76  Pulse 97  Temp(Src) 98.1 F (36.7 C) (Oral)  Resp 16  Ht 5\' 4"  (1.626 m)  Wt 192 lb (87.091 kg)  BMI 32.96 kg/m2  SpO2 98%  LMP 06/29/2011  Physical Exam  Constitutional: She is oriented to person, place, and time. She appears well-developed and well-nourished. No distress.  HENT:  Head: Normocephalic and atraumatic.  Right Ear: External ear normal.  Left Ear: External ear normal.  Mouth/Throat: Oropharynx is clear and moist. No oropharyngeal exudate.  Eyes: Conjunctivae and EOM are normal. Pupils are equal, round, and reactive to light. Right eye exhibits no nystagmus. Left eye exhibits no nystagmus.  Fundoscopic exam:      The right eye shows no papilledema.       The left eye shows no papilledema.  Neck: Normal range of motion, full passive range of motion without pain and phonation normal. Neck supple. Carotid bruit is not present.  Cardiovascular: Normal rate, regular rhythm, normal heart sounds and intact distal pulses.   Exam reveals no gallop and no friction rub.   No murmur heard. Pulmonary/Chest: Effort normal and breath sounds normal. No respiratory distress. She has no wheezes. She has no rales.  Abdominal: Soft. Bowel sounds are normal. She exhibits no distension. There is no tenderness. There is no rebound and no guarding.  Musculoskeletal: Normal range of motion. She exhibits no edema and no tenderness.  Neurological: She is alert and oriented to person, place, and time. She has normal reflexes. She displays no tremor and normal reflexes. No cranial nerve deficit or sensory deficit. She exhibits normal muscle tone. She displays no seizure activity. Coordination and gait normal. GCS eye subscore is 4. GCS verbal subscore is 5. GCS motor subscore is 6.       Normal visual fields to confrontation, no cerebellar signs, non-focal and normal neurologic exam.  Skin: Skin is warm and dry. No rash noted. She is not diaphoretic. No erythema. No pallor.  Psychiatric: She has a normal mood and affect. Her behavior is normal. Judgment and thought content normal.    ED Course  Procedures (including critical care time)  Labs Reviewed  URINALYSIS, ROUTINE W REFLEX MICROSCOPIC - Abnormal; Notable for the following:    Specific Gravity, Urine <1.005 (*)    Hgb urine dipstick TRACE (*)    All other components within normal limits  POCT PREGNANCY, URINE  URINE MICROSCOPIC-ADD ON  COMPREHENSIVE METABOLIC PANEL  LIPASE, BLOOD  CBC  DIFFERENTIAL   No results found.   No diagnosis found.    MDM  The patient appears most likely to have had either a tension headache or migraine headache which is now resolved and her neurologic examination is nonfocal and not suggestive of space occupying lesion within the brain, stroke, and her history and physical examination do not suggest subarachnoid hemorrhage. Nonetheless, with the patient not having history of recurrent headaches and this one being reported to be severe, I  will obtain a CT head to exclude these etiologies further. Her history of nausea and vomiting is established and is currently being worked up as an outpatient. I do not think the nausea and vomiting are related to the headache other than that vomiting may have made the headache worse. Nonetheless, I will evaluate the patient's complete metabolic profile, lipase, and urinalysis to exclude acute hepatitis, cholecystitis, pancreatitis, or urinary tract infection.        Felisa Bonier, MD 07/07/11 0229  2:51 AM The patient's symptoms have improved/resolved,  and her laboratory studies and CT scan of her brain reveal no serious or acute findings. I will discharge the patient home. The patient states her understanding of and agreement with the plan of care.  Felisa Bonier, MD 07/07/11 [phone]

## 2011-07-07 NOTE — Discharge Instructions (Signed)
Clear Liquid Diet The clear liquid dietconsists of foods that are liquid or will become liquid at room temperature.You should be able to see through the liquid and beverages. Examples of foods allowed on a clear liquid diet include fruit juice, broth or bouillon, gelatin, or frozen ice pops. The purpose of this diet is to provide necessary fluid, electrolytes such as sodium and potassium, and energy to keep the body functioning during times when you are not able to consume a regular diet.A clear liquid diet should not be continued for long periods of time as it is not nutritionally adequate.  REASONS FOR USING A CLEAR LIQUID DIET  In sudden onset (acute) conditions for a patient before or after surgery.   As the first step in oral feeding.   For fluid and electrolyte replacement in diarrheal diseases.   As a diet before certain medical tests are performed.  ADEQUACY The clear liquid diet is adequate only in ascorbic acid, according to the Recommended Dietary Allowances of the [employer]. CHOOSING FOODS Breads and Starches  Allowed:  None are allowed.   Avoid: All are avoided.  Vegetables  Allowed:  Strained tomato or vegetable juice.   Avoid: Any others.  Fruit  Allowed:  Strained fruit juices and fruit drinks. Include 1 serving of citrus or vitamin C-enriched fruit juice daily.   Avoid: Any others.  Meat and Meat Substitutes  Allowed:  None are allowed.   Avoid: All are avoided.  Milk  Allowed:  None are allowed.   Avoid: All are avoided.  Soups and Combination Foods  Allowed:  Clear bouillon, broth, or strained broth-based soups.   Avoid: Any others.  Desserts and Sweets  Allowed:  Sugar, honey. High protein gelatin. Flavored gelatin, ices, or frozen ice pops that do not contain milk.   Avoid: Any others.  Fats and Oils  Allowed:  None are allowed.   Avoid: All are avoided.  Beverages  Allowed: Cereal beverages, coffee (regular or  decaffeinated), tea, or soda at the discretion of your caregiver.   Avoid: Any others.  Condiments  Allowed:  Iodized salt.   Avoid: Any others, including pepper.  Supplements  Allowed:  Liquid nutrition beverages.   Avoid: Any others that contain lactose or fiber.  SAMPLE MEAL PLAN Breakfast  4 oz (120 mL) strained orange juice.    to 1 cup (125 to 250 mL) gelatin (plain or fortified).   1 cup (250 mL) beverage (coffee or tea).   Sugar, if desired.  Midmorning Snack   cup (125 mL) gelatin (plain or fortified).  Lunch  1 cup (250 mL) broth or consomm.   4 oz (120 mL) strained grapefruit juice.    cup (125 mL) gelatin (plain or fortified).   1 cup (250 mL) beverage (coffee or tea).   Sugar, if desired.  Midafternoon Snack   cup (125 mL) fruit ice.    cup (125 mL) strained fruit juice.  Dinner  1 cup (250 mL) broth or consomm.    cup (125 mL) cranberry juice.    cup (125 mL) flavored gelatin (plain or fortified).   1 cup (250 mL) beverage (coffee or tea).   Sugar, if desired.  Evening Snack  4 oz (120 mL) strained apple juice (vitamin C-fortified).    cup (125 mL) flavored gelatin (plain or fortified).  Document Released: 04/02/2005 Document Revised: 03/22/2011 Document Reviewed: 06/30/2010 [facility] Patient Information 2012 [place], [place].Nausea and Vomiting Nausea is a sick feeling that often comes before  throwing up (vomiting). Vomiting is a reflex where stomach contents come out of your mouth. Vomiting can cause severe loss of body fluids (dehydration). Children and elderly adults can become dehydrated quickly, especially if they also have diarrhea. Nausea and vomiting are symptoms of a condition or disease. It is important to find the cause of your symptoms. CAUSES   Direct irritation of the stomach lining. This irritation can result from increased acid production (gastroesophageal reflux disease), infection, food poisoning, taking certain  medicines (such as nonsteroidal anti-inflammatory drugs), alcohol use, or tobacco use.   Signals from the brain.These signals could be caused by a headache, heat exposure, an inner ear disturbance, increased pressure in the brain from injury, infection, a tumor, or a concussion, pain, emotional stimulus, or metabolic problems.   An obstruction in the gastrointestinal tract (bowel obstruction).   Illnesses such as diabetes, hepatitis, gallbladder problems, appendicitis, kidney problems, cancer, sepsis, atypical symptoms of a heart attack, or eating disorders.   Medical treatments such as chemotherapy and radiation.   Receiving medicine that makes you sleep (general anesthetic) during surgery.  DIAGNOSIS Your caregiver may ask for tests to be done if the problems do not improve after a few days. Tests may also be done if symptoms are severe or if the reason for the nausea and vomiting is not clear. Tests may include:  Urine tests.   Blood tests.   Stool tests.   Cultures (to look for evidence of infection).   X-rays or other imaging studies.  Test results can help your caregiver make decisions about treatment or the need for additional tests. TREATMENT You need to stay well hydrated. Drink frequently but in small amounts.You may wish to drink water, sports drinks, clear broth, or eat frozen ice pops or gelatin dessert to help stay hydrated.When you eat, eating slowly may help prevent nausea.There are also some antinausea medicines that may help prevent nausea. HOME CARE INSTRUCTIONS   Take all medicine as directed by your caregiver.   If you do not have an appetite, do not force yourself to eat. However, you must continue to drink fluids.   If you have an appetite, eat a normal diet unless your caregiver tells you differently.   Eat a variety of complex carbohydrates (rice, wheat, potatoes, bread), lean meats, yogurt, fruits, and vegetables.   Avoid high-fat foods because they  are more difficult to digest.   Drink enough water and fluids to keep your urine clear or pale yellow.   If you are dehydrated, ask your caregiver for specific rehydration instructions. Signs of dehydration may include:   Severe thirst.   Dry lips and mouth.   Dizziness.   Dark urine.   Decreasing urine frequency and amount.   Confusion.   Rapid breathing or pulse.  SEEK IMMEDIATE MEDICAL CARE IF:   You have blood or brown flecks (like coffee grounds) in your vomit.   You have black or bloody stools.   You have a severe headache or stiff neck.   You are confused.   You have severe abdominal pain.   You have chest pain or trouble breathing.   You do not urinate at least once every 8 hours.   You develop cold or clammy skin.   You continue to vomit for longer than 24 to 48 hours.   You have a fever.  MAKE SURE YOU:   Understand these instructions.   Will watch your condition.   Will get help right away if you are  not doing well or get worse.  Document Released: 04/02/2005 Document Revised: 03/22/2011 Document Reviewed: 08/30/2010 [name] Patient Information 2012 [place], [place].Headaches, Frequently Asked Questions MIGRAINE HEADACHES Q: What is migraine? What causes it? How can I treat it? A: Generally, migraine headaches begin as a dull ache. Then they develop into a constant, throbbing, and pulsating pain. You may experience pain at the temples. You may experience pain at the front or back of one or both sides of the head. The pain is usually accompanied by a combination of:  Nausea.   Vomiting.   Sensitivity to light and noise.  Some people (about 15%) experience an aura (see below) before an attack. The cause of migraine is believed to be chemical reactions in the brain. Treatment for migraine may include over-the-counter or prescription medications. It may also include self-help techniques. These include relaxation training and biofeedback.  Q: What is  an aura? A: About 15% of people with migraine get an "aura". This is a sign of neurological symptoms that occur before a migraine headache. You may see wavy or jagged lines, dots, or flashing lights. You might experience tunnel vision or blind spots in one or both eyes. The aura can include visual or auditory hallucinations (something imagined). It may include disruptions in smell (such as strange odors), taste or touch. Other symptoms include:  Numbness.   A "pins and needles" sensation.   Difficulty in recalling or speaking the correct word.  These neurological events may last as long as 60 minutes. These symptoms will fade as the headache begins. Q: What is a trigger? A: Certain physical or environmental factors can lead to or "trigger" a migraine. These include:  Foods.   Hormonal changes.   Weather.   Stress.  It is important to remember that triggers are different for everyone. To help prevent migraine attacks, you need to figure out which triggers affect you. Keep a headache diary. This is a good way to track triggers. The diary will help you talk to your healthcare professional about your condition. Q: Does weather affect migraines? A: Bright sunshine, hot, humid conditions, and drastic changes in barometric pressure may lead to, or "trigger," a migraine attack in some people. But studies have shown that weather does not act as a trigger for everyone with migraines. Q: What is the link between migraine and hormones? A: Hormones start and regulate many of your body's functions. Hormones keep your body in balance within a constantly changing environment. The levels of hormones in your body are unbalanced at times. Examples are during menstruation, pregnancy, or menopause. That can lead to a migraine attack. In fact, about three quarters of all women with migraine report that their attacks are related to the menstrual cycle.  Q: Is there an increased risk of stroke for migraine  sufferers? A: The likelihood of a migraine attack causing a stroke is very remote. That is not to say that migraine sufferers cannot have a stroke associated with their migraines. In persons under age 47, the most common associated factor for stroke is migraine headache. But over the course of a person's normal life span, the occurrence of migraine headache may actually be associated with a reduced risk of dying from cerebrovascular disease due to stroke.  Q: What are acute medications for migraine? A: Acute medications are used to treat the pain of the headache after it has started. Examples over-the-counter medications, NSAIDs, ergots, and triptans.  Q: What are the triptans? A: Triptans are the newest  class of abortive medications. They are specifically targeted to treat migraine. Triptans are vasoconstrictors. They moderate some chemical reactions in the brain. The triptans work on receptors in your brain. Triptans help to restore the balance of a neurotransmitter called serotonin. Fluctuations in levels of serotonin are thought to be a main cause of migraine.  Q: Are over-the-counter medications for migraine effective? A: Over-the-counter, or "OTC," medications may be effective in relieving mild to moderate pain and associated symptoms of migraine. But you should see your caregiver before beginning any treatment regimen for migraine.  Q: What are preventive medications for migraine? A: Preventive medications for migraine are sometimes referred to as "prophylactic" treatments. They are used to reduce the frequency, severity, and length of migraine attacks. Examples of preventive medications include antiepileptic medications, antidepressants, beta-blockers, calcium channel blockers, and NSAIDs (nonsteroidal anti-inflammatory drugs). Q: Why are anticonvulsants used to treat migraine? A: During the past few years, there has been an increased interest in antiepileptic drugs for the prevention of migraine.  They are sometimes referred to as "anticonvulsants". Both epilepsy and migraine may be caused by similar reactions in the brain.  Q: Why are antidepressants used to treat migraine? A: Antidepressants are typically used to treat people with depression. They may reduce migraine frequency by regulating chemical levels, such as serotonin, in the brain.  Q: What alternative therapies are used to treat migraine? A: The term "alternative therapies" is often used to describe treatments considered outside the scope of conventional Western medicine. Examples of alternative therapy include acupuncture, acupressure, and yoga. Another common alternative treatment is herbal therapy. Some herbs are believed to relieve headache pain. Always discuss alternative therapies with your caregiver before proceeding. Some herbal products contain arsenic and other toxins. TENSION HEADACHES Q: What is a tension-type headache? What causes it? How can I treat it? A: Tension-type headaches occur randomly. They are often the result of temporary stress, anxiety, fatigue, or anger. Symptoms include soreness in your temples, a tightening band-like sensation around your head (a "vice-like" ache). Symptoms can also include a pulling feeling, pressure sensations, and contracting head and neck muscles. The headache begins in your forehead, temples, or the back of your head and neck. Treatment for tension-type headache may include over-the-counter or prescription medications. Treatment may also include self-help techniques such as relaxation training and biofeedback. CLUSTER HEADACHES Q: What is a cluster headache? What causes it? How can I treat it? A: Cluster headache gets its name because the attacks come in groups. The pain arrives with little, if any, warning. It is usually on one side of the head. A tearing or bloodshot eye and a runny nose on the same side of the headache may also accompany the pain. Cluster headaches are believed to be  caused by chemical reactions in the brain. They have been described as the most severe and intense of any headache type. Treatment for cluster headache includes prescription medication and oxygen. SINUS HEADACHES Q: What is a sinus headache? What causes it? How can I treat it? A: When a cavity in the bones of the face and skull (a sinus) becomes inflamed, the inflammation will cause localized pain. This condition is usually the result of an allergic reaction, a tumor, or an infection. If your headache is caused by a sinus blockage, such as an infection, you will probably have a fever. An x-ray will confirm a sinus blockage. Your caregiver's treatment might include antibiotics for the infection, as well as antihistamines or decongestants.  REBOUND HEADACHES Q:  What is a rebound headache? What causes it? How can I treat it? A: A pattern of taking acute headache medications too often can lead to a condition known as "rebound headache." A pattern of taking too much headache medication includes taking it more than 2 days per week or in excessive amounts. That means more than the label or a caregiver advises. With rebound headaches, your medications not only stop relieving pain, they actually begin to cause headaches. Doctors treat rebound headache by tapering the medication that is being overused. Sometimes your caregiver will gradually substitute a different type of treatment or medication. Stopping may be a challenge. Regularly overusing a medication increases the potential for serious side effects. Consult a caregiver if you regularly use headache medications more than 2 days per week or more than the label advises. ADDITIONAL QUESTIONS AND ANSWERS Q: What is biofeedback? A: Biofeedback is a self-help treatment. Biofeedback uses special equipment to monitor your body's involuntary physical responses. Biofeedback monitors:  Breathing.   Pulse.   Heart rate.   Temperature.   Muscle tension.   Brain  activity.  Biofeedback helps you refine and perfect your relaxation exercises. You learn to control the physical responses that are related to stress. Once the technique has been mastered, you do not need the equipment any more. Q: Are headaches hereditary? A: Four out of five (80%) of people that suffer report a family history of migraine. Scientists are not sure if this is genetic or a family predisposition. Despite the uncertainty, a child has a 50% chance of having migraine if one parent suffers. The child has a 75% chance if both parents suffer.  Q: Can children get headaches? A: By the time they reach high school, most young people have experienced some type of headache. Many safe and effective approaches or medications can prevent a headache from occurring or stop it after it has begun.  Q: What type of doctor should I see to diagnose and treat my headache? A: Start with your primary caregiver. Discuss his or her experience and approach to headaches. Discuss methods of classification, diagnosis, and treatment. Your caregiver may decide to recommend you to a headache specialist, depending upon your symptoms or other physical conditions. Having diabetes, allergies, etc., may require a more comprehensive and inclusive approach to your headache. The National Head[employer] Foundation will provide, upon request, a list of [facility] physician members in your state. Document Released: 06/23/2003 Document Revised: 03/22/2011 Document Reviewed: 12/01/2007 [facility] Patient Information 2012 [place], [place].Tension Headache (Muscle Contraction Headache) Tension headache is one of the most common causes of head pain. These headaches are usually felt as a pain over the top of your head and back of your neck. Stress, anxiety, and depression are common triggers for these headaches. Tension headaches are not life-threatening and will not lead to other types of headaches. Tension headaches can often be diagnosed by taking a  history from the patient and a physical exam. Sometimes, further lab and x-ray studies are used to confirm the diagnosis. Your caregiver can advise you on how to get help solving problems that cause anxiety or stress. Antidepressants can be prescribed if depression is a problem. HOME CARE INSTRUCTIONS   If testing was done, call for your results. Remember, it is your responsibility to get the results of all testing. Do not assume everything is fine because you do not hear from your caregiver.   Only take over-the-counter or prescription medicines for pain, discomfort, or fever as directed by your  caregiver.   Biofeedback, massage, or other relaxation techniques may be helpful.   Ice packs or heat to the head and neck can be used. Use these three to four times per day or as needed.   Physical therapy may be a useful addition to treatment.   If headaches continue, even with therapy, you may need to think about lifestyle changes.   Avoid excessive use of pain killers, as rebound headaches can occur.  SEEK MEDICAL CARE IF:   You develop problems with medications prescribed.   You do not respond or get no relief from medications.   You have a change from the usual headache.   You develop nausea (feeling sick to your stomach) or vomiting.  SEEK IMMEDIATE MEDICAL CARE IF:   Your headache becomes severe.   You have an unexplained oral temperature above 102 F (38.9 C).   You develop a stiff neck.   You have loss of vision.   You have muscular weakness.   You have loss of muscular control.   You develop severe symptoms different from your first symptoms.   You start losing your balance or have trouble walking.   You feel faint or pass out.  MAKE SURE YOU:   Understand these instructions.   Will watch your condition.   Will get help right away if you are not doing well or get worse.  Document Released: 04/02/2005 Document Revised: 03/22/2011 Document Reviewed:  11/20/2007 [facility] Patient Information 2012 [place], [place].Nausea, Adult Nausea is the feeling that you have an upset stomach or have to vomit. Nausea by itself is not likely a serious concern, but it may be an early sign of more serious medical problems. As nausea gets worse, it can lead to vomiting. If vomiting develops, there is the risk of dehydration.  CAUSES   Viral infections.   Food poisoning.   Medicines.   Pregnancy.   Motion sickness.   Migraine headaches.   Emotional distress.   Severe pain from any source.   Alcohol intoxication.  HOME CARE INSTRUCTIONS  Get plenty of rest.   Ask your caregiver about specific rehydration instructions.   Eat small amounts of food and sip liquids more often.   Take all medicines as told by your caregiver.  SEEK MEDICAL CARE IF:  You have not improved after 2 days, or you get worse.   You have a headache.  SEEK IMMEDIATE MEDICAL CARE IF:   You have a fever.   You faint.   You keep vomiting or have blood in your vomit.   You are extremely weak or dehydrated.   You have dark or bloody stools.   You have severe chest or abdominal pain.  MAKE SURE YOU:  Understand these instructions.   Will watch your condition.   Will get help right away if you are not doing well or get worse.  Document Released: 05/10/2004 Document Revised: 03/22/2011 Document Reviewed: 12/13/2010 [facility] Patient Information 2012 [place], [place].

## 2011-07-09 ENCOUNTER — Telehealth: Payer: Self-pay | Admitting: *Deleted

## 2011-07-09 NOTE — Telephone Encounter (Signed)
Pt calls and states she needs her "esophagus" stretched, states she feels like her throat is closing, she is having burning gnawing pain in her chest, states this has been going on for awhile and she has been to ED for this problem recently. States she is short of breath at times. She is ask to go to Ed or urg care asap and not drive, she does not want to but states she will. She also ask for f/u appt, 3/26 at 1015 dr Gilford Rile

## 2011-07-10 ENCOUNTER — Encounter: Payer: Self-pay | Admitting: Internal Medicine

## 2011-07-10 ENCOUNTER — Ambulatory Visit: Payer: Medicare Other | Admitting: Internal Medicine

## 2011-07-11 ENCOUNTER — Encounter (HOSPITAL_COMMUNITY): Payer: Self-pay | Admitting: *Deleted

## 2011-07-11 ENCOUNTER — Encounter: Payer: Self-pay | Admitting: Gastroenterology

## 2011-07-11 ENCOUNTER — Telehealth: Payer: Self-pay | Admitting: Gastroenterology

## 2011-07-11 ENCOUNTER — Emergency Department (HOSPITAL_COMMUNITY)
Admission: EM | Admit: 2011-07-11 | Discharge: 2011-07-11 | Discharge status: Against Medical Advice | Payer: Medicare Other | Attending: Emergency Medicine | Admitting: Emergency Medicine

## 2011-07-11 ENCOUNTER — Ambulatory Visit (INDEPENDENT_AMBULATORY_CARE_PROVIDER_SITE_OTHER): Payer: Medicare Other | Admitting: Gastroenterology

## 2011-07-11 VITALS — BP 136/90 | HR 85 | Temp 98.0°F | Ht 64.0 in | Wt 190.2 lb

## 2011-07-11 DIAGNOSIS — R1310 Dysphagia, unspecified: Secondary | ICD-10-CM

## 2011-07-11 DIAGNOSIS — R197 Diarrhea, unspecified: Secondary | ICD-10-CM | POA: Diagnosis not present

## 2011-07-11 DIAGNOSIS — R1314 Dysphagia, pharyngoesophageal phase: Secondary | ICD-10-CM | POA: Diagnosis not present

## 2011-07-11 DIAGNOSIS — K219 Gastro-esophageal reflux disease without esophagitis: Secondary | ICD-10-CM

## 2011-07-11 DIAGNOSIS — R1110 Vomiting, unspecified: Secondary | ICD-10-CM | POA: Diagnosis not present

## 2011-07-11 DIAGNOSIS — R112 Nausea with vomiting, unspecified: Secondary | ICD-10-CM

## 2011-07-11 LAB — BASIC METABOLIC PANEL
BUN: 15 mg/dL (ref 6–23)
Calcium: 10.3 mg/dL (ref 8.4–10.5)
GFR calc non Af Amer: >90 mL/min (ref >90)
Glucose, Bld: 140 mg/dL — ABNORMAL HIGH (ref 70–99)
Sodium: 133 mEq/L — ABNORMAL LOW (ref 135–145)

## 2011-07-11 LAB — DIFFERENTIAL
Eosinophils Absolute: 0.2 10*3/uL (ref 0.0–0.7)
Eosinophils Relative: 2 % (ref 0–5)
Lymphs Abs: 3.5 10*3/uL (ref 0.7–4.0)
Monocytes Relative: 5 % (ref 3–12)

## 2011-07-11 LAB — CBC
MCH: 28.5 pg (ref 26.0–34.0)
MCV: 85 fL (ref 78.0–100.0)
Platelets: 265 10*3/uL (ref 150–400)
RBC: 4.74 MIL/uL (ref 3.87–5.11)

## 2011-07-11 NOTE — Progress Notes (Signed)
Faxed to PCP

## 2011-07-11 NOTE — Progress Notes (Signed)
Primary Care Physician:  Vernice Jefferson, MD, MD  Primary Gastroenterologist:  Roetta Sessions, MD   Chief Complaint  Patient presents with  . Emesis  . Dysphagia    HPI:  [name] is a 46 y.o. female here for further evaluation a recurrent esophageal solid food dysphagia, postprandial nausea and vomiting. Last seen December 2011. She was having rectal bleeding. Last colonoscopy October 2007 at which time she had hemorrhoids. We actually scheduled her for another colonoscopy however patient never pursued. She ended up in the hospital twice with psychiatric admissions around that time.  She tells me the last 2 months she's had increasing difficulty swallowing breads and meat. Burping, No heartburn on prilosec 40mg  bid. Vomiting almost daily. Nausea and vomiting always occur after a meal. Denies abdominal pain. States she had been doing very well with regards to her intermittent nausea and vomiting up until the holidays when she did consume some alcohol on the weekends. She has a history of prior heavy daily alcohol abuse but had not drank in nearly 3 years. Over Christmas and new year she did consume limited quantities of alcohol. Last drink was one cup of wine 2 weeks ago. She was in the hospital in February with community-acquired pneumonia. Since that time she's had some issues with diarrhea, up to 5 stools daily,although none in one or 2 days.No melena, brbpr. Feels like cannot breath with swallowing issues.  Denies abdominal pain.  She is scheduled for gastric emptying study by another provider next week. She actually had a normal gastric emptying study October 2011.   Current Outpatient Prescriptions  Medication Sig Dispense Refill  . cetirizine (ZYRTEC) 10 MG tablet Take 1 tablet (10 mg total) by mouth daily.  30 tablet  3  . diphenhydrAMINE (BENADRYL) 25 MG tablet Take 50 mg by mouth daily as needed. Sleep/allergies      . fluticasone (FLONASE) 50 MCG/ACT nasal spray Place 2 sprays  into the nose daily.  16 g  2  . hydrochlorothiazide (HYDRODIURIL) 25 MG tablet Take 1 tablet (25 mg total) by mouth daily.  31 tablet  1  . ibuprofen (ADVIL,MOTRIN) 800 MG tablet Take 1 tablet (800 mg total) by mouth every 8 (eight) hours as needed for pain (headache - take with food).  20 tablet  0  . insulin glargine (LANTUS) 100 UNIT/ML injection Inject 25 Units into the skin at bedtime.  10 mL  11  . LORazepam (ATIVAN) 0.5 MG tablet Take 1-2 tablets (0.5-1 mg total) by mouth 2 (two) times daily as needed. For anxiety  90 tablet  0  . lovastatin (MEVACOR) 20 MG tablet Take 1 tablet (20 mg total) by mouth at bedtime.  31 tablet  1  . metFORMIN (GLUCOPHAGE) 1000 MG tablet Take 1 tablet (1,000 mg total) by mouth 2 (two) times daily with a meal.  62 tablet  1  . metoprolol (LOPRESSOR) 50 MG tablet Take 1 tablet (50 mg total) by mouth 2 (two) times daily.  62 tablet  11  . omeprazole (PRILOSEC) 20 MG capsule Take 1 capsule (20 mg total) by mouth 2 (two) times daily.  60 capsule  11  . ondansetron (ZOFRAN) 8 MG tablet Take 1 tablet (8 mg total) by mouth every 8 (eight) hours as needed for nausea.  20 tablet  0  . ondansetron (ZOFRAN-ODT) 4 MG disintegrating tablet Take 1 tablet (4 mg total) by mouth every 8 (eight) hours as needed for nausea.  20 tablet  0  .  potassium chloride SA (K-DUR,KLOR-CON) 20 MEQ tablet Take 20 mEq by mouth 2 (two) times daily.        . ziprasidone (GEODON) 40 MG capsule Take 40 mg by mouth every morning.       . ziprasidone (GEODON) 60 MG capsule Take 60 mg by mouth at bedtime.          Allergies as of 07/11/2011 - Review Complete 07/11/2011  Allergen Reaction Noted  . Orange Shortness Of Breath and Itching 03/27/2011  . Shrimp (shellfish allergy) Shortness Of Breath and Itching 03/27/2011  . Penicillins Rash   . Sulfonamide derivatives Rash   . Glipizide Other (See Comments) 01/15/2011  . Metronidazole Swelling   . Sulfamethoxazole w/trimethoprim Rash 02/27/2010     Past Medical History  Diagnosis Date  . Arteriosclerotic cardiovascular disease (ASCVD)     Minimal at cath in [facility].stress nuclear study in 8/08 with nl EF; neg stress echo in 2010  . Diabetes mellitus, type 2 2000    Onset in 2000; no insulin  . Hyperlipidemia   . Hypertension `    during treatment with Geodon  . Gastroesophageal reflux disease     Schatzki's ring  . Anemia, iron deficiency   . Alcohol abuse   . Depression   . Community acquired pneumonia 01/03/10    2011; with pleural effusion-[facility] acute resp failure  . Obesity   . Community acquired pneumonia 05/2010  . Schizoaffective disorder     requiring multiple psychiatric admissions    Past Surgical History  Procedure Date  . Dilation and curettage, diagnostic / therapeutic 1992  . Esophagogastroduodenoscopy 09/16/08    Dr. Ronni Rumble hiatal hernia/excoriations involving the cardia and mucosa consistent with trauma, antral erosions  of linear petechiae ? gastritis versus early gastric antral vascular  ectasia.Marland Kitchen biopsy showed reactive gastropathy. No H. pylori.  . Colonoscopy 02/05/2006    Dr. Elly Modena hemorrhoids, otherwise normal rectum and colon/  suspect the patient has had hematochezia secondary to hemorrhoids  . Esophagogastroduodenoscopy 09/2007    Dr. Rinaldo Ratel ring, dilated to 56 French Maloney dilator, small hiatal hernia, antral erosions, biopsies reactive gastropathy.    Family History  Problem Relation Age of Onset  . Colon cancer Other   . Hypertension Mother   . Stroke Father     deceased at age 31  . Heart disease Sister     History   Social History  . Marital Status: Single    Spouse Name: N/A    Number of Children: N/A  . Years of Education: N/A   Occupational History  . UNEMPLOYED    Social History Main Topics  . Smoking status: Current Everyday Smoker -- 1.0 packs/day for 30 years    Types: Cigarettes  . Smokeless tobacco: Not on file    Comment: Sometimes she smokes more; up to 2 packs/day.  . Alcohol Use: 0.0 oz/week     occasional.history of heavy use  . Drug Use: No  . Sexually Active: No   Other Topics Concern  . Not on file   Social History Narrative  . No narrative on file      ROS:  General: Negative for anorexia, weight loss, fever, chills, fatigue, weakness. Eyes: Negative for vision changes.  ENT: Negative for hoarseness, nasal congestion. CV: Negative for chest pain, angina, palpitations, dyspnea on exertion, peripheral edema.  Respiratory: Negative for dyspnea at rest, dyspnea on exertion, cough, sputum, wheezing.  GI: See history of present illness. GU:  Negative for  dysuria, hematuria, urinary incontinence, urinary frequency, nocturnal urination.  MS: Negative for joint pain, low back pain.  Derm: Negative for rash or itching.  Neuro: Negative for weakness, abnormal sensation, seizure, frequent headaches, memory loss, confusion.  Psych: Negative for anxiety, depression, suicidal ideation, hallucinations. Denies significant depression at this time. Endo: Negative for unusual weight change.  Heme: Negative for bruising or bleeding. Allergy: Negative for rash or hives.    Physical Examination:  BP 136/90  Pulse 85  Temp(Src) 98 F (36.7 C) (Temporal)  Ht 5\' 4"  (1.626 m)  Wt 190 lb 3.2 oz (86.274 kg)  BMI 32.65 kg/m2  LMP 06/29/2011   General: Well-nourished, well-developed in no acute distress.  Head: Normocephalic, atraumatic.   Eyes: Conjunctiva pink, no icterus. Mouth: Oropharyngeal mucosa moist and pink , no lesions erythema or exudate. Neck: Supple without thyromegaly, masses, or lymphadenopathy.  Lungs: Clear to auscultation bilaterally.  Heart: Regular rate and rhythm, no murmurs rubs or gallops.  Abdomen: Bowel sounds are normal, nontender, nondistended, no hepatosplenomegaly or masses, no abdominal bruits or    hernia , no rebound or guarding.   Rectal: not  performed Extremities: No lower extremity edema. No clubbing or deformities.  Neuro: Alert and oriented x 4 , grossly normal neurologically.  Skin: Warm and dry, no rash or jaundice.   Psych: Alert and cooperative, normal mood and affect.  Labs: Lab Results  Component Value Date   WBC 9.2 07/07/2011   HGB 12.6 07/07/2011   HCT 37.3 07/07/2011   MCV 84.8 07/07/2011   PLT 310 07/07/2011   Lab Results  Component Value Date   ALT 11 07/07/2011   AST 11 07/07/2011   ALKPHOS 86 07/07/2011   BILITOT 0.1* 07/07/2011   Lab Results  Component Value Date   FERRITIN 19 01/15/2011   Lab Results  Component Value Date   VITAMINB12 612 04/18/2010   Lab Results  Component Value Date   FERRITIN 19 01/15/2011   Urine pregnancy test 07/07/11, negative.  Imaging Studies: Dg Chest 2 View  07/03/2011  *RADIOLOGY REPORT*  Clinical Data: Cough, recent history of pneumonia  CHEST - 2 VIEW  Comparison: Chest x-ray of 06/12/2011  Findings: The previous right lower lobe and right middle lobe pneumonia has cleared.  No active infiltrate or effusion is seen. Mediastinal contours are stable.  The heart is within normal limits in size.  No bony abnormality is seen.  IMPRESSION: No active lung disease.  Clearing of right lung pneumonia  Original Report Authenticated By: Juline Patch, M.D.    Ct Head Wo Contrast  07/07/2011  *RADIOLOGY REPORT*  Clinical Data: Severe headache  CT HEAD WITHOUT CONTRAST  Technique:  Contiguous axial images were obtained from the base of the skull through the vertex without contrast.  Comparison: 03/17/2010  Findings: Normal ventricular morphology. No midline shift or mass effect. Normal appearance of brain parenchyma. No intracranial hemorrhage, mass lesion, or acute infarction. Question tiny old lacunar infarct versus prominent perivascular space at posterior limb of left internal capsule. Visualized paranasal sinuses and mastoid air cells clear. Bones unremarkable.  IMPRESSION: Question  tiny lacunar infarct versus prominent perivascular space at the posterior limb left internal capsule, unchanged. No acute intracranial abnormalities.  Original Report Authenticated By: Lollie Marrow, M.D.

## 2011-07-11 NOTE — Assessment & Plan Note (Signed)
Postprandial nausea/vomiting in the setting of esophageal dysphagia. EGD to be done to rule out gastritis, ulcer. If EGD does not explain symptoms, consider gallbladder workup. She had a normal gastric emptying study done 18 months ago.

## 2011-07-11 NOTE — ED Notes (Signed)
Vomiting for months, states she started vomiting blood within the hour

## 2011-07-11 NOTE — Assessment & Plan Note (Addendum)
Diarrhea with recent hospitalization and antibiotic use for pneumonia. I really do not get a history of chronic diarrhea. Will check stool studies.  Notably, patient never had her colonoscopy done last year. Last procedure in 2007, hemorrhoids. She denies any recent bleeding. She does not want to pursue colonoscopy at this time due to upper GI symptoms.

## 2011-07-11 NOTE — Assessment & Plan Note (Signed)
Complaints of solid food esophageal dysphagia. Previous Schatzki ring in the past. Last EGD 2010. Recommend EGD with dilation in the near future. Patient does not want to wait until Dr. Jena Gauss his back in town due to severity of persistent symptoms. She's been seen in the ED on several occasions with postprandial nausea and vomiting as well. Dr. Darrick Penna has agreed to proceed with EGD/ED. To be done with deep sedation given polypharmacy and h/o etoh abuse in past.  I have discussed the risks, alternatives, benefits with regards to but not limited to the risk of reaction to medication, bleeding, infection, perforation and the patient is agreeable to proceed. Written consent to be obtained.

## 2011-07-11 NOTE — ED Notes (Signed)
C/o nausea, vomiting x 4 30 min PTA; denies abd pain; denies diarrhea; reports similar symptoms off and on since December 2012; has seen PCP for same, and states is scheduled for upper endoscopy in 6 days.

## 2011-07-11 NOTE — Telephone Encounter (Signed)
Pt aware of PRE OP on 03/27 @ 1:45

## 2011-07-11 NOTE — ED Notes (Signed)
Pt reporting improvement in nausea symptoms. No vomiting.  Pt has decided to go home without seeing physican.  States that she is being followed by gastroenterologist and is scheduled for EGD on Tuesday.  Discussed ramifications of leaving AMA, pt verbalized understanding.

## 2011-07-11 NOTE — Assessment & Plan Note (Signed)
Typical gerd sx well-controlled on omeprazole 40mg  BID.

## 2011-07-11 NOTE — Patient Instructions (Signed)
Please collect stool if diarrhea persists. We have scheduled you with an upper endoscopy with Dr. Darrick Penna, as Dr. Jena Gauss will be out-of-town. See separate instructions.

## 2011-07-12 ENCOUNTER — Encounter (HOSPITAL_COMMUNITY): Payer: Self-pay

## 2011-07-12 ENCOUNTER — Encounter (HOSPITAL_COMMUNITY)
Admission: RE | Admit: 2011-07-12 | Discharge: 2011-07-12 | Disposition: A | Discharge status: Home / Self Care | Payer: Medicare Other | Source: Ambulatory Visit | Attending: Gastroenterology | Admitting: Gastroenterology

## 2011-07-12 ENCOUNTER — Other Ambulatory Visit: Payer: Self-pay

## 2011-07-12 DIAGNOSIS — K222 Esophageal obstruction: Secondary | ICD-10-CM | POA: Diagnosis not present

## 2011-07-12 DIAGNOSIS — R112 Nausea with vomiting, unspecified: Secondary | ICD-10-CM | POA: Diagnosis not present

## 2011-07-12 DIAGNOSIS — F259 Schizoaffective disorder, unspecified: Secondary | ICD-10-CM | POA: Diagnosis not present

## 2011-07-12 DIAGNOSIS — Z79899 Other long term (current) drug therapy: Secondary | ICD-10-CM | POA: Diagnosis not present

## 2011-07-12 DIAGNOSIS — Z794 Long term (current) use of insulin: Secondary | ICD-10-CM | POA: Diagnosis not present

## 2011-07-12 DIAGNOSIS — I10 Essential (primary) hypertension: Secondary | ICD-10-CM | POA: Diagnosis not present

## 2011-07-12 DIAGNOSIS — R1310 Dysphagia, unspecified: Secondary | ICD-10-CM | POA: Diagnosis not present

## 2011-07-12 DIAGNOSIS — F17200 Nicotine dependence, unspecified, uncomplicated: Secondary | ICD-10-CM | POA: Diagnosis not present

## 2011-07-12 DIAGNOSIS — K259 Gastric ulcer, unspecified as acute or chronic, without hemorrhage or perforation: Secondary | ICD-10-CM | POA: Diagnosis not present

## 2011-07-12 DIAGNOSIS — E785 Hyperlipidemia, unspecified: Secondary | ICD-10-CM | POA: Diagnosis not present

## 2011-07-12 DIAGNOSIS — F329 Major depressive disorder, single episode, unspecified: Secondary | ICD-10-CM | POA: Diagnosis not present

## 2011-07-12 DIAGNOSIS — I2510 Atherosclerotic heart disease of native coronary artery without angina pectoris: Secondary | ICD-10-CM | POA: Diagnosis not present

## 2011-07-12 DIAGNOSIS — R197 Diarrhea, unspecified: Secondary | ICD-10-CM | POA: Diagnosis not present

## 2011-07-12 DIAGNOSIS — K219 Gastro-esophageal reflux disease without esophagitis: Secondary | ICD-10-CM | POA: Diagnosis not present

## 2011-07-12 DIAGNOSIS — E119 Type 2 diabetes mellitus without complications: Secondary | ICD-10-CM | POA: Diagnosis not present

## 2011-07-12 LAB — BASIC METABOLIC PANEL
BUN: 9 mg/dL (ref 6–23)
CO2: 23 mEq/L (ref 19–32)
Calcium: 10.2 mg/dL (ref 8.4–10.5)
Creatinine, Ser: 0.7 mg/dL (ref 0.50–1.10)
Glucose, Bld: 140 mg/dL — ABNORMAL HIGH (ref 70–99)

## 2011-07-12 NOTE — Patient Instructions (Addendum)
20 [name]  07/12/2011   Your procedure is scheduled on:  07/17/2011  Report to [facility] at  920  AM.  Call this number if you have problems the morning of surgery: [phone]   Remember:   Do not eat food:After Midnight.  May have clear liquids:until Midnight .  Clear liquids include soda, tea, black coffee, apple or grape juice, broth.  Take these medicines the morning of surgery with A SIP OF WATER:  Zyrtec,ativan,lopressor,prilosec,zofran. Take 1/2 of lantus dosage night before your procedure.   Do not wear jewelry, make-up or nail polish.  Do not wear lotions, powders, or perfumes. You may wear deodorant.  Do not shave 48 hours prior to surgery.  Do not bring valuables to the hospital.  Contacts, dentures or bridgework may not be worn into surgery.  Leave suitcase in the car. After surgery it may be brought to your room.  For patients admitted to the hospital, checkout time is 11:00 AM the day of discharge.   Patients discharged the day of surgery will not be allowed to drive home.  Name and phone number of your driver: family  Special Instructions: N/A   Please read over the following fact sheets that you were given: Pain Booklet, Surgical Site Infection Prevention, Anesthesia Post-op Instructions and Care and Recovery After Surgery Esophagogastroduodenoscopy This is an endoscopic procedure (a procedure that uses a device like a flexible telescope) that allows your caregiver to view the upper stomach and small bowel. This test allows your caregiver to look at the esophagus. The esophagus carries food from your mouth to your stomach. They can also look at your duodenum. This is the first part of the small intestine that attaches to the stomach. This test is used to detect problems in the bowel such as ulcers and inflammation. PREPARATION FOR TEST Nothing to eat after midnight the day before the test. NORMAL FINDINGS Normal esophagus, stomach, and duodenum. Ranges for  normal findings may vary among different laboratories and hospitals. You should always check with your doctor after having lab work or other tests done to discuss the meaning of your test results and whether your values are considered within normal limits. MEANING OF TEST  Your caregiver will go over the test results with you and discuss the importance and meaning of your results, as well as treatment options and the need for additional tests if necessary. OBTAINING THE TEST RESULTS It is your responsibility to obtain your test results. Ask the lab or department performing the test when and how you will get your results. Document Released: 08/03/2004 Document Revised: 03/22/2011 Document Reviewed: 03/12/2008 [facility] Patient Information 2012 [place], [place].PATIENT INSTRUCTIONS POST-ANESTHESIA  IMMEDIATELY FOLLOWING SURGERY:  Do not drive or operate machinery for the first twenty four hours after surgery.  Do not make any important decisions for twenty four hours after surgery or while taking narcotic pain medications or sedatives.  If you develop intractable nausea and vomiting or a severe headache please notify your doctor immediately.  FOLLOW-UP:  Please make an appointment with your surgeon as instructed. You do not need to follow up with anesthesia unless specifically instructed to do so.  WOUND CARE INSTRUCTIONS (if applicable):  Keep a dry clean dressing on the anesthesia/puncture wound site if there is drainage.  Once the wound has quit draining you may leave it open to air.  Generally you should leave the bandage intact for twenty four hours unless there is drainage.  If the epidural site drains  for more than 36-48 hours please call the anesthesia department.  QUESTIONS?:  Please feel free to call your physician or the hospital operator if you have any questions, and they will be happy to assist you.     [facility] Anesthesia Department [address] [place]  [place] [phone]

## 2011-07-16 ENCOUNTER — Telehealth: Payer: Self-pay | Admitting: Gastroenterology

## 2011-07-16 NOTE — Telephone Encounter (Signed)
Pt should follow a clear liquid diet after 6 pm BECAUSE SHE MAY HAVE HER ESOPHAGUS STRETCHED.

## 2011-07-16 NOTE — Telephone Encounter (Signed)
Pt called this morning and stated that when she went for her Pre Op, she was told that she can continue to eat and drink whatever she wanted up until midnight because she is diabetic, however on her instruction sheet from [name], we have her switching to clear liquids after 6pm this evening.  Her procedure is at 10:50 tomorrow. Please advise

## 2011-07-16 NOTE — Telephone Encounter (Signed)
Pt informed

## 2011-07-17 ENCOUNTER — Other Ambulatory Visit (HOSPITAL_COMMUNITY): Payer: Medicare Other

## 2011-07-17 ENCOUNTER — Encounter (HOSPITAL_COMMUNITY): Payer: Self-pay | Admitting: Anesthesiology

## 2011-07-17 ENCOUNTER — Ambulatory Visit (HOSPITAL_COMMUNITY): Payer: Medicare Other | Admitting: Anesthesiology

## 2011-07-17 ENCOUNTER — Encounter (HOSPITAL_COMMUNITY): Payer: Self-pay | Admitting: *Deleted

## 2011-07-17 ENCOUNTER — Ambulatory Visit (HOSPITAL_COMMUNITY)
Admission: RE | Admit: 2011-07-17 | Discharge: 2011-07-17 | Disposition: A | Discharge status: Home / Self Care | Payer: Medicare Other | Source: Ambulatory Visit | Attending: Gastroenterology | Admitting: Gastroenterology

## 2011-07-17 ENCOUNTER — Encounter (HOSPITAL_COMMUNITY)
Admission: RE | Disposition: A | Discharge status: Home / Self Care | Payer: Self-pay | Source: Ambulatory Visit | Attending: Gastroenterology

## 2011-07-17 DIAGNOSIS — I10 Essential (primary) hypertension: Secondary | ICD-10-CM | POA: Insufficient documentation

## 2011-07-17 DIAGNOSIS — K222 Esophageal obstruction: Secondary | ICD-10-CM | POA: Diagnosis not present

## 2011-07-17 DIAGNOSIS — E785 Hyperlipidemia, unspecified: Secondary | ICD-10-CM | POA: Insufficient documentation

## 2011-07-17 DIAGNOSIS — Z794 Long term (current) use of insulin: Secondary | ICD-10-CM | POA: Insufficient documentation

## 2011-07-17 DIAGNOSIS — K259 Gastric ulcer, unspecified as acute or chronic, without hemorrhage or perforation: Secondary | ICD-10-CM | POA: Insufficient documentation

## 2011-07-17 DIAGNOSIS — Z79899 Other long term (current) drug therapy: Secondary | ICD-10-CM | POA: Insufficient documentation

## 2011-07-17 DIAGNOSIS — F329 Major depressive disorder, single episode, unspecified: Secondary | ICD-10-CM | POA: Insufficient documentation

## 2011-07-17 DIAGNOSIS — R112 Nausea with vomiting, unspecified: Secondary | ICD-10-CM | POA: Insufficient documentation

## 2011-07-17 DIAGNOSIS — K2970 Gastritis, unspecified, without bleeding: Secondary | ICD-10-CM | POA: Diagnosis not present

## 2011-07-17 DIAGNOSIS — E119 Type 2 diabetes mellitus without complications: Secondary | ICD-10-CM | POA: Insufficient documentation

## 2011-07-17 DIAGNOSIS — F3289 Other specified depressive episodes: Secondary | ICD-10-CM | POA: Insufficient documentation

## 2011-07-17 DIAGNOSIS — R197 Diarrhea, unspecified: Secondary | ICD-10-CM | POA: Insufficient documentation

## 2011-07-17 DIAGNOSIS — R1110 Vomiting, unspecified: Secondary | ICD-10-CM

## 2011-07-17 DIAGNOSIS — K2990 Gastroduodenitis, unspecified, without bleeding: Secondary | ICD-10-CM | POA: Diagnosis not present

## 2011-07-17 DIAGNOSIS — I2510 Atherosclerotic heart disease of native coronary artery without angina pectoris: Secondary | ICD-10-CM | POA: Insufficient documentation

## 2011-07-17 DIAGNOSIS — R1310 Dysphagia, unspecified: Secondary | ICD-10-CM

## 2011-07-17 DIAGNOSIS — F259 Schizoaffective disorder, unspecified: Secondary | ICD-10-CM | POA: Insufficient documentation

## 2011-07-17 DIAGNOSIS — F17200 Nicotine dependence, unspecified, uncomplicated: Secondary | ICD-10-CM | POA: Insufficient documentation

## 2011-07-17 DIAGNOSIS — K219 Gastro-esophageal reflux disease without esophagitis: Secondary | ICD-10-CM | POA: Insufficient documentation

## 2011-07-17 HISTORY — PX: SAVORY DILATION: SHX5439

## 2011-07-17 SURGERY — ESOPHAGOGASTRODUODENOSCOPY (EGD) WITH PROPOFOL
Anesthesia: Monitor Anesthesia Care

## 2011-07-17 MED ORDER — LACTATED RINGERS IV SOLN
INTRAVENOUS | Status: DC
  Administered 2011-07-17: 10:00:00 via INTRAVENOUS

## 2011-07-17 MED ORDER — STERILE WATER FOR IRRIGATION IR SOLN
Status: DC | PRN
  Administered 2011-07-17: 11:00:00

## 2011-07-17 MED ORDER — FENTANYL CITRATE 0.05 MG/ML IJ SOLN: 25.0000 ug | INTRAMUSCULAR | Status: DC | PRN

## 2011-07-17 MED ORDER — PANTOPRAZOLE SODIUM 40 MG PO TBEC: DELAYED_RELEASE_TABLET | ORAL | Status: DC

## 2011-07-17 MED ORDER — MINERAL OIL PO OIL
TOPICAL_OIL | ORAL | Status: AC
  Filled 2011-07-17: qty 30

## 2011-07-17 MED ORDER — MIDAZOLAM HCL 2 MG/2ML IJ SOLN
INTRAMUSCULAR | Status: AC
  Filled 2011-07-17: qty 2

## 2011-07-17 MED ORDER — GLYCOPYRROLATE 0.2 MG/ML IJ SOLN
INTRAMUSCULAR | Status: AC
  Filled 2011-07-17: qty 1

## 2011-07-17 MED ORDER — MIDAZOLAM HCL 5 MG/5ML IJ SOLN
INTRAMUSCULAR | Status: DC | PRN
  Administered 2011-07-17 (×2): 1 mg via INTRAVENOUS

## 2011-07-17 MED ORDER — PROPOFOL 10 MG/ML IV EMUL
INTRAVENOUS | Status: DC | PRN
  Administered 2011-07-17: 45 ug/kg/min via INTRAVENOUS

## 2011-07-17 MED ORDER — PROPOFOL 10 MG/ML IV EMUL
INTRAVENOUS | Status: AC
  Filled 2011-07-17: qty 20

## 2011-07-17 MED ORDER — BUTAMBEN-TETRACAINE-BENZOCAINE 2-2-14 % EX AERO
2.0000 | INHALATION_SPRAY | Freq: Once | CUTANEOUS | Status: AC
  Administered 2011-07-17: 2 via TOPICAL
  Filled 2011-07-17: qty 56

## 2011-07-17 MED ORDER — STERILE WATER FOR IRRIGATION IR SOLN
Status: DC | PRN
  Administered 2011-07-17: 1000 mL

## 2011-07-17 MED ORDER — GLYCOPYRROLATE 0.2 MG/ML IJ SOLN
0.2000 mg | Freq: Once | INTRAMUSCULAR | Status: AC
  Administered 2011-07-17: 0.2 mg via INTRAVENOUS

## 2011-07-17 MED ORDER — ONDANSETRON HCL 4 MG/2ML IJ SOLN: 4.0000 mg | Freq: Once | INTRAMUSCULAR | Status: DC | PRN

## 2011-07-17 MED ORDER — ONDANSETRON HCL 4 MG/2ML IJ SOLN
INTRAMUSCULAR | Status: AC
  Filled 2011-07-17: qty 2

## 2011-07-17 MED ORDER — LIDOCAINE HCL 1 % IJ SOLN
INTRAMUSCULAR | Status: DC | PRN
  Administered 2011-07-17: 25 mg via INTRADERMAL

## 2011-07-17 MED ORDER — MIDAZOLAM HCL 2 MG/2ML IJ SOLN
1.0000 mg | INTRAMUSCULAR | Status: DC | PRN
  Administered 2011-07-17: 2 mg via INTRAVENOUS

## 2011-07-17 MED ORDER — ONDANSETRON HCL 4 MG/2ML IJ SOLN
4.0000 mg | Freq: Once | INTRAMUSCULAR | Status: AC
  Administered 2011-07-17: 4 mg via INTRAVENOUS

## 2011-07-17 SURGICAL SUPPLY — 18 items
BLOCK BITE 60FR ADLT L/F BLUE (MISCELLANEOUS) ×2 IMPLANT
ELECT REM PT RETURN 9FT ADLT (ELECTROSURGICAL)
ELECTRODE REM PT RTRN 9FT ADLT (ELECTROSURGICAL) IMPLANT
FLOOR PAD 36X40 (MISCELLANEOUS) ×2
FORCEP RJ3 GP 1.8X160 W-NEEDLE (CUTTING FORCEPS) IMPLANT
FORCEPS BIOP RAD 4 LRG CAP 4 (CUTTING FORCEPS) ×1 IMPLANT
NDL SCLEROTHERAPY 25GX240 (NEEDLE) IMPLANT
NEEDLE SCLEROTHERAPY 25GX240 (NEEDLE) IMPLANT
PAD FLOOR 36X40 (MISCELLANEOUS) ×1 IMPLANT
PROBE APC STR FIRE (PROBE) IMPLANT
PROBE INJECTION GOLD (MISCELLANEOUS)
PROBE INJECTION GOLD 7FR (MISCELLANEOUS) IMPLANT
SNARE ROTATE MED OVAL 20MM (MISCELLANEOUS) IMPLANT
SNARE SHORT THROW 13M SML OVAL (MISCELLANEOUS) IMPLANT
SYR 50ML LL SCALE MARK (SYRINGE) ×1 IMPLANT
TUBING ENDO SMARTCAP PENTAX (MISCELLANEOUS) ×4 IMPLANT
TUBING IRRIGATION ENDOGATOR (MISCELLANEOUS) ×2 IMPLANT
WATER STERILE IRR 1000ML POUR (IV SOLUTION) ×2 IMPLANT

## 2011-07-17 NOTE — Discharge Instructions (Signed)
YOUR VOMITING IS MOST LIKEY DUE TO THE ULCERS IN YOUR STOMACH. I dilated your esophagus. You have a stricture near the base of your esophagus. I biopsied your stomach.  CONTINUE OMEPRAZOLE 30 MINUTES BEFORE MEALS TWICE DAILY. USE PHENERGAN AS NEEDED FOR NAUSEA OR VOMITING. STOP USING IBUPROFEN. NO ASPIRIN FOR 2 WEEKS.  FOLLOW UP IN 2 MOS WITH DR. Jena Gauss. UPPER ENDOSCOPY AFTER CARE Read the instructions outlined below and refer to this sheet in the next week. These discharge instructions provide you with general information on caring for yourself after you leave the hospital. While your treatment has been planned according to the most current medical practices available, unavoidable complications occasionally occur. If you have any problems or questions after discharge, call DR. Ram Haugan, [phone].  ACTIVITY  You may resume your regular activity, but move at a slower pace for the next 24 hours.   Take frequent rest periods for the next 24 hours.   Walking will help get rid of the air and reduce the bloated feeling in your belly (abdomen).   No driving for 24 hours (because of the medicine (anesthesia) used during the test).   You may shower.   Do not sign any important legal documents or operate any machinery for 24 hours (because of the anesthesia used during the test).    NUTRITION  Drink plenty of fluids.   You may resume your normal diet as instructed by your doctor.   Begin with a light meal and progress to your normal diet. Heavy or fried foods are harder to digest and may make you feel sick to your stomach (nauseated).   Avoid alcoholic beverages for 24 hours or as instructed.    MEDICATIONS  You may resume your normal medications.   WHAT YOU CAN EXPECT TODAY  Some feelings of bloating in the abdomen.   Passage of more gas than usual.    IF YOU HAD A BIOPSY TAKEN DURING THE UPPER ENDOSCOPY:  No aspirin products for 3 days.   Eat a soft diet IF YOU HAVE  NAUSEA, BLOATING, ABDOMINAL PAIN, OR VOMITING.    FINDING OUT THE RESULTS OF YOUR TEST Not all test results are available during your visit. DR. Darrick Penna WILL CALL YOU WITHIN 7 DAYS OF YOUR PROCEDUE WITH YOUR RESULTS. Do not assume everything is normal if you have not heard from DR. Bonita Brindisi IN ONE WEEK, CALL HER OFFICE AT [phone].  SEEK IMMEDIATE MEDICAL ATTENTION AND CALL THE OFFICE: [phone] IF:  You have more than a spotting of blood in your stool.   Your belly is swollen (abdominal distention).   You are nauseated or vomiting.   You have a temperature over 101F.   You have abdominal pain or discomfort that is severe or gets worse throughout the day.  Ulcer Disease (Peptic Ulcer, Gastric Ulcer, Duodenal Ulcer) You have an ulcer. This may be in your stomach (gastric ulcer) or in the first part of your small bowel, the duodenum (duodenal ulcer). An ulcer is a break in the lining of the stomach or duodenum. The ulcer causes erosion into the deeper tissue.  CAUSES The stomach has a lining to protect itself from the acid that digests food. The lining can be damaged in two main ways:  The Helico Pylori bacteria (H. Pyolori) can infect the lining of the stomach and cause ulcers.   Nonsteroidal, anti-inflammatory medications (NSAIDS) can cause gastric ulcerations.   Smoking tobacco can increase the acid in the stomach. This can lead to ulcers,  and will impair healing of ulcers.  Other factors, such as alcohol use and stress may contribute to ulcer formation.  SYMPTOMS The problems (symptoms) of ulcer disease are usually a burning or gnawing of the mid-upper belly (abdomen). This is often worse on an empty stomach and may get better with food. This may be associated with feeling sick to your stomach (nausea), bloating, and vomiting.  HOME CARE INSTRUCTIONS Continue regular work and usual activities unless advised otherwise by your caregiver.  Avoid tobacco, alcohol, and  caffeine. Tobacco use will decrease and slow the rates of healing.   Avoid foods that seem to aggravate or cause discomfort.   Special diets are not usually needed.    ESOPHAGEAL STRICTURE  Esophageal strictures can be caused by stomach acid backing up into the tube that carries food from the mouth down to the stomach (lower esophagus).  TREATMENT There are a number of non-prescription medicines used to treat reflux/stricture, including: Antacids.  Proton-pump inhibitors: OMEPRAZOLE  HOME CARE INSTRUCTIONS Eat 2-3 hours before going to bed.  Try to reach and maintain a healthy weight.  Do not eat just a few very large meals. Instead, eat 4 TO 6 smaller meals throughout the day.  Try to identify foods and beverages that make your symptoms worse, and avoid these.  Avoid tight clothing.  Do not exercise right after eating.

## 2011-07-17 NOTE — Anesthesia Preprocedure Evaluation (Signed)
Anesthesia Evaluation  Patient identified by MRN, date of birth, ID band Patient awake    Reviewed: Allergy & Precautions, H&P , NPO status , Patient's Chart, lab work & pertinent test results  History of Anesthesia Complications Negative for: history of anesthetic complications  Airway Mallampati: II      Dental  (+) Teeth Intact   Pulmonary pneumonia  (Feb. 2012), Current Smoker,  breath sounds clear to auscultation        Cardiovascular hypertension, Pt. on medications Rhythm:Regular Rate:Normal     Neuro/Psych PSYCHIATRIC DISORDERS (hx PTSS) Depression    GI/Hepatic GERD-  Medicated,(+)     substance abuse  alcohol use,   Endo/Other  Diabetes mellitus-, Well Controlled, Type 2, Oral Hypoglycemic Agents  Renal/GU      Musculoskeletal   Abdominal   Peds  Hematology   Anesthesia Other Findings   Reproductive/Obstetrics                           Anesthesia Physical Anesthesia Plan  ASA: III  Anesthesia Plan: MAC   Post-op Pain Management:    Induction: Intravenous  Airway Management Planned: Simple Face Mask  Additional Equipment:   Intra-op Plan:   Post-operative Plan:   Informed Consent: I have reviewed the patients History and Physical, chart, labs and discussed the procedure including the risks, benefits and alternatives for the proposed anesthesia with the patient or authorized representative who has indicated his/her understanding and acceptance.     Plan Discussed with:   Anesthesia Plan Comments:         Anesthesia Quick Evaluation

## 2011-07-17 NOTE — Interval H&P Note (Signed)
History and Physical Interval Note:  07/17/2011 10:12 AM  [name]  has presented today for surgery, with the diagnosis of nausea and vomiting, dysphagia  The various methods of treatment have been discussed with the patient and family. After consideration of risks, benefits and other options for treatment, the patient has consented to  Procedure(s) (LRB): ESOPHAGOGASTRODUODENOSCOPY (EGD) WITH PROPOFOL (N/A) SAVORY DILATION (N/A) MALONEY DILATION (N/A) as a surgical intervention .  The patients' history has been reviewed, patient examined, no change in status, stable for surgery.  I have reviewed the patients' chart and labs.  Questions were answered to the patient's satisfaction.     [employer]

## 2011-07-17 NOTE — H&P (View-Only) (Signed)
Primary Care Physician:  Vernice Jefferson, MD, MD  Primary Gastroenterologist:  Roetta Sessions, MD   Chief Complaint  Patient presents with  . Emesis  . Dysphagia    HPI:  [name] is a 46 y.o. female here for further evaluation a recurrent esophageal solid food dysphagia, postprandial nausea and vomiting. Last seen December 2011. She was having rectal bleeding. Last colonoscopy October 2007 at which time she had hemorrhoids. We actually scheduled her for another colonoscopy however patient never pursued. She ended up in the hospital twice with psychiatric admissions around that time.  She tells me the last 2 months she's had increasing difficulty swallowing breads and meat. Burping, No heartburn on prilosec 40mg  bid. Vomiting almost daily. Nausea and vomiting always occur after a meal. Denies abdominal pain. States she had been doing very well with regards to her intermittent nausea and vomiting up until the holidays when she did consume some alcohol on the weekends. She has a history of prior heavy daily alcohol abuse but had not drank in nearly 3 years. Over Christmas and new year she did consume limited quantities of alcohol. Last drink was one cup of wine 2 weeks ago. She was in the hospital in February with community-acquired pneumonia. Since that time she's had some issues with diarrhea, up to 5 stools daily,although none in one or 2 days.No melena, brbpr. Feels like cannot breath with swallowing issues.  Denies abdominal pain.  She is scheduled for gastric emptying study by another provider next week. She actually had a normal gastric emptying study October 2011.   Current Outpatient Prescriptions  Medication Sig Dispense Refill  . cetirizine (ZYRTEC) 10 MG tablet Take 1 tablet (10 mg total) by mouth daily.  30 tablet  3  . diphenhydrAMINE (BENADRYL) 25 MG tablet Take 50 mg by mouth daily as needed. Sleep/allergies      . fluticasone (FLONASE) 50 MCG/ACT nasal spray Place 2 sprays  into the nose daily.  16 g  2  . hydrochlorothiazide (HYDRODIURIL) 25 MG tablet Take 1 tablet (25 mg total) by mouth daily.  31 tablet  1  . ibuprofen (ADVIL,MOTRIN) 800 MG tablet Take 1 tablet (800 mg total) by mouth every 8 (eight) hours as needed for pain (headache - take with food).  20 tablet  0  . insulin glargine (LANTUS) 100 UNIT/ML injection Inject 25 Units into the skin at bedtime.  10 mL  11  . LORazepam (ATIVAN) 0.5 MG tablet Take 1-2 tablets (0.5-1 mg total) by mouth 2 (two) times daily as needed. For anxiety  90 tablet  0  . lovastatin (MEVACOR) 20 MG tablet Take 1 tablet (20 mg total) by mouth at bedtime.  31 tablet  1  . metFORMIN (GLUCOPHAGE) 1000 MG tablet Take 1 tablet (1,000 mg total) by mouth 2 (two) times daily with a meal.  62 tablet  1  . metoprolol (LOPRESSOR) 50 MG tablet Take 1 tablet (50 mg total) by mouth 2 (two) times daily.  62 tablet  11  . omeprazole (PRILOSEC) 20 MG capsule Take 1 capsule (20 mg total) by mouth 2 (two) times daily.  60 capsule  11  . ondansetron (ZOFRAN) 8 MG tablet Take 1 tablet (8 mg total) by mouth every 8 (eight) hours as needed for nausea.  20 tablet  0  . ondansetron (ZOFRAN-ODT) 4 MG disintegrating tablet Take 1 tablet (4 mg total) by mouth every 8 (eight) hours as needed for nausea.  20 tablet  0  .  potassium chloride SA (K-DUR,KLOR-CON) 20 MEQ tablet Take 20 mEq by mouth 2 (two) times daily.        . ziprasidone (GEODON) 40 MG capsule Take 40 mg by mouth every morning.       . ziprasidone (GEODON) 60 MG capsule Take 60 mg by mouth at bedtime.          Allergies as of 07/11/2011 - Review Complete 07/11/2011  Allergen Reaction Noted  . Orange Shortness Of Breath and Itching 03/27/2011  . Shrimp (shellfish allergy) Shortness Of Breath and Itching 03/27/2011  . Penicillins Rash   . Sulfonamide derivatives Rash   . Glipizide Other (See Comments) 01/15/2011  . Metronidazole Swelling   . Sulfamethoxazole w/trimethoprim Rash 02/27/2010     Past Medical History  Diagnosis Date  . Arteriosclerotic cardiovascular disease (ASCVD)     Minimal at cath in [facility].stress nuclear study in 8/08 with nl EF; neg stress echo in 2010  . Diabetes mellitus, type 2 2000    Onset in 2000; no insulin  . Hyperlipidemia   . Hypertension `    during treatment with Geodon  . Gastroesophageal reflux disease     Schatzki's ring  . Anemia, iron deficiency   . Alcohol abuse   . Depression   . Community acquired pneumonia 01/03/10    2011; with pleural effusion-[facility] acute resp failure  . Obesity   . Community acquired pneumonia 05/2010  . Schizoaffective disorder     requiring multiple psychiatric admissions    Past Surgical History  Procedure Date  . Dilation and curettage, diagnostic / therapeutic 1992  . Esophagogastroduodenoscopy 09/16/08    Dr. Ronni Rumble hiatal hernia/excoriations involving the cardia and mucosa consistent with trauma, antral erosions  of linear petechiae ? gastritis versus early gastric antral vascular  ectasia.Marland Kitchen biopsy showed reactive gastropathy. No H. pylori.  . Colonoscopy 02/05/2006    Dr. Elly Modena hemorrhoids, otherwise normal rectum and colon/  suspect the patient has had hematochezia secondary to hemorrhoids  . Esophagogastroduodenoscopy 09/2007    Dr. Rinaldo Ratel ring, dilated to 56 French Maloney dilator, small hiatal hernia, antral erosions, biopsies reactive gastropathy.    Family History  Problem Relation Age of Onset  . Colon cancer Other   . Hypertension Mother   . Stroke Father     deceased at age 46  . Heart disease Sister     History   Social History  . Marital Status: Single    Spouse Name: N/A    Number of Children: N/A  . Years of Education: N/A   Occupational History  . UNEMPLOYED    Social History Main Topics  . Smoking status: Current Everyday Smoker -- 1.0 packs/day for 30 years    Types: Cigarettes  . Smokeless tobacco: Not on file    Comment: Sometimes she smokes more; up to 2 packs/day.  . Alcohol Use: 0.0 oz/week     occasional.history of heavy use  . Drug Use: No  . Sexually Active: No   Other Topics Concern  . Not on file   Social History Narrative  . No narrative on file      ROS:  General: Negative for anorexia, weight loss, fever, chills, fatigue, weakness. Eyes: Negative for vision changes.  ENT: Negative for hoarseness, nasal congestion. CV: Negative for chest pain, angina, palpitations, dyspnea on exertion, peripheral edema.  Respiratory: Negative for dyspnea at rest, dyspnea on exertion, cough, sputum, wheezing.  GI: See history of present illness. GU:  Negative for  dysuria, hematuria, urinary incontinence, urinary frequency, nocturnal urination.  MS: Negative for joint pain, low back pain.  Derm: Negative for rash or itching.  Neuro: Negative for weakness, abnormal sensation, seizure, frequent headaches, memory loss, confusion.  Psych: Negative for anxiety, depression, suicidal ideation, hallucinations. Denies significant depression at this time. Endo: Negative for unusual weight change.  Heme: Negative for bruising or bleeding. Allergy: Negative for rash or hives.    Physical Examination:  BP 136/90  Pulse 85  Temp(Src) 98 F (36.7 C) (Temporal)  Ht 5\' 4"  (1.626 m)  Wt 190 lb 3.2 oz (86.274 kg)  BMI 32.65 kg/m2  LMP 06/29/2011   General: Well-nourished, well-developed in no acute distress.  Head: Normocephalic, atraumatic.   Eyes: Conjunctiva pink, no icterus. Mouth: Oropharyngeal mucosa moist and pink , no lesions erythema or exudate. Neck: Supple without thyromegaly, masses, or lymphadenopathy.  Lungs: Clear to auscultation bilaterally.  Heart: Regular rate and rhythm, no murmurs rubs or gallops.  Abdomen: Bowel sounds are normal, nontender, nondistended, no hepatosplenomegaly or masses, no abdominal bruits or    hernia , no rebound or guarding.   Rectal: not  performed Extremities: No lower extremity edema. No clubbing or deformities.  Neuro: Alert and oriented x 4 , grossly normal neurologically.  Skin: Warm and dry, no rash or jaundice.   Psych: Alert and cooperative, normal mood and affect.  Labs: Lab Results  Component Value Date   WBC 9.2 07/07/2011   HGB 12.6 07/07/2011   HCT 37.3 07/07/2011   MCV 84.8 07/07/2011   PLT 310 07/07/2011   Lab Results  Component Value Date   ALT 11 07/07/2011   AST 11 07/07/2011   ALKPHOS 86 07/07/2011   BILITOT 0.1* 07/07/2011   Lab Results  Component Value Date   FERRITIN 19 01/15/2011   Lab Results  Component Value Date   VITAMINB12 612 04/18/2010   Lab Results  Component Value Date   FERRITIN 19 01/15/2011   Urine pregnancy test 07/07/11, negative.  Imaging Studies: Dg Chest 2 View  07/03/2011  *RADIOLOGY REPORT*  Clinical Data: Cough, recent history of pneumonia  CHEST - 2 VIEW  Comparison: Chest x-ray of 06/12/2011  Findings: The previous right lower lobe and right middle lobe pneumonia has cleared.  No active infiltrate or effusion is seen. Mediastinal contours are stable.  The heart is within normal limits in size.  No bony abnormality is seen.  IMPRESSION: No active lung disease.  Clearing of right lung pneumonia  Original Report Authenticated By: Juline Patch, M.D.    Ct Head Wo Contrast  07/07/2011  *RADIOLOGY REPORT*  Clinical Data: Severe headache  CT HEAD WITHOUT CONTRAST  Technique:  Contiguous axial images were obtained from the base of the skull through the vertex without contrast.  Comparison: 03/17/2010  Findings: Normal ventricular morphology. No midline shift or mass effect. Normal appearance of brain parenchyma. No intracranial hemorrhage, mass lesion, or acute infarction. Question tiny old lacunar infarct versus prominent perivascular space at posterior limb of left internal capsule. Visualized paranasal sinuses and mastoid air cells clear. Bones unremarkable.  IMPRESSION: Question  tiny lacunar infarct versus prominent perivascular space at the posterior limb left internal capsule, unchanged. No acute intracranial abnormalities.  Original Report Authenticated By: Lollie Marrow, M.D.

## 2011-07-17 NOTE — Transfer of Care (Signed)
Immediate Anesthesia Transfer of Care Note  Patient: [name]  Procedure(s) Performed: Procedure(s) (LRB): ESOPHAGOGASTRODUODENOSCOPY (EGD) WITH PROPOFOL (N/A) SAVORY DILATION (N/A)  Patient Location: PACU  Anesthesia Type: MAC  Level of Consciousness: awake and patient cooperative  Airway & Oxygen Therapy: Patient Spontanous Breathing and Patient connected to face mask oxygen  Post-op Assessment: Report given to PACU RN, Post -op Vital signs reviewed and stable and Patient moving all extremities  Post vital signs: Reviewed and stable  Complications: No apparent anesthesia complications

## 2011-07-17 NOTE — Anesthesia Postprocedure Evaluation (Signed)
  Anesthesia Post-op Note  Patient: [name]  Procedure(s) Performed: Procedure(s) (LRB): ESOPHAGOGASTRODUODENOSCOPY (EGD) WITH PROPOFOL (N/A) SAVORY DILATION (N/A)  Patient Location: PACU  Anesthesia Type: MAC  Level of Consciousness: awake, alert , oriented and patient cooperative  Airway and Oxygen Therapy: Patient Spontanous Breathing  Post-op Pain: none  Post-op Assessment: Post-op Vital signs reviewed, Patient's Cardiovascular Status Stable, PATIENT'S CARDIOVASCULAR STATUS UNSTABLE, Patent Airway and No signs of Nausea or vomiting  Post-op Vital Signs: Reviewed and stable  Complications: No apparent anesthesia complications

## 2011-07-18 ENCOUNTER — Telehealth: Payer: Self-pay

## 2011-07-18 NOTE — Telephone Encounter (Signed)
Insurance company call and stated that Protonix is not on her formally list. She has been on Prilosec 40 mg daily  for years and worked for her but she will need to try Nexium or Dexilant. Please advise

## 2011-07-18 NOTE — Telephone Encounter (Signed)
Called Rx in to [facility]([place]). Will call patient and let her know.

## 2011-07-18 NOTE — Telephone Encounter (Signed)
PT REQUESTED PROTONIX. CALL IN DEXILANT 60 MG 1 PO QD #31, RFX2. SHE WILL NEED TO SEE DR. Jena Gauss FOR REFILLS.

## 2011-07-19 ENCOUNTER — Encounter (HOSPITAL_COMMUNITY): Payer: Self-pay | Admitting: Gastroenterology

## 2011-07-23 ENCOUNTER — Telehealth: Payer: Self-pay | Admitting: Gastroenterology

## 2011-07-23 NOTE — Progress Notes (Signed)
REVIEWED.  

## 2011-07-23 NOTE — Telephone Encounter (Signed)
LMOM for pt to call. 

## 2011-07-23 NOTE — Telephone Encounter (Addendum)
Please call pt. HER stomach Bx shows mild gastritis. Continue protonix 30 MINUTES PRIOR TO MEALS bid. She may have phenergan supp 25 mg 1/2-1 pr q4h prn for nausea and vomiting, #20, rfx0. If she is throwing up or not eating much she will not have a BM. Use Miralax daily for 3 days.

## 2011-07-23 NOTE — Telephone Encounter (Signed)
Rx called to April at [facility].  

## 2011-07-23 NOTE — Telephone Encounter (Signed)
LMOM to call.

## 2011-07-23 NOTE — Telephone Encounter (Signed)
Pt called back and was in formed

## 2011-07-23 NOTE — Telephone Encounter (Signed)
Pt returned call. Said she has had nausea for several days. She vomited this morning. She has med for nausea but ti comes back up when she takes it. Would like to try some suppositories for nausea. She said she has not had a good BM since her procedure, just a little stool at the time. Please advise!

## 2011-07-23 NOTE — Telephone Encounter (Signed)
Called to find out some more info. LMOM for a return call.

## 2011-07-23 NOTE — Telephone Encounter (Signed)
Patient is asking procedure results, and she wants Dr. Darrick Penna to know that shes vomiting again and stomach is swollen please advise??

## 2011-07-29 NOTE — Op Note (Signed)
[facility] [address] [place], [place]  [address]  ENDOSCOPY PROCEDURE REPORT  PATIENT:  [name], [name]  MR#:  [id number] BIRTHDATE:  [birth year], 45 yrs. old  GENDER:  female  ENDOSCOPIST:  Jonette Eva, MD ASSISTANT: Referred by:  Starleen Blue, MD PRIMARY GI MD: Roetta Sessions, M.D.  PROCEDURE DATE:  07/17/2011 PROCEDURE:  EGD with biopsy for H. pylori, EGD with dilatation over guidewire ASA CLASS: INDICATIONS:  vomiting, dysphagia, abdominal pain PMHX: GERD, ETOH ABUSE-none in 3 years USES IBUPROFEN  MEDICATIONS:   MAC sedation, administered by CRNA TOPICAL ANESTHETIC:  Cetacaine Spray  DESCRIPTION OF PROCEDURE:   After the risks benefits and alternatives of the procedure were thoroughly explained, informed consent was obtained.  The  endoscope was introduced through the mouth and advanced to the second portion of the duodenum.  The instrument was slowly withdrawn as the mucosa was carefully examined.  Prior to withdrawal of the scope, the guidwire was placed.  The esophagus was dilated successfully.  The patient was recovered in endoscopy and discharged home in satisfactory condition. <<PROCEDUREIMAGES>> A stricture was found in the distal esophagus NARROWING THE LUMEN TO 12-13 MM. NO BARRETT'S. STREAKY ERYHEMA IN THE ANTRUM & THREE 2-3 MM ulcers IN THE BODY OF THE STOMACH were found & BIOPSIED VIA COLD FORCEPS. NL DUODENUM .    Dilation was then perform ed at the distal esophagus  1) Dilator:  Savary over guidewire  Size(s):  12.8 TO 16 MM Resistance:  minimal TO MODERATE  Heme:  none Appearance:  COMPLICATIONS:  None  ENDOSCOPIC IMPRESSION: 1) Stricture in the distal esophagus 2) Ulcers, multiple RECOMMENDATIONS: 1) await biopsy results 2) OMP BID 30 MINUTES PRIOR TO MEALS 3) PHENERGAN PRN 4) OPV IN 2 MOS W/ DR. Jena Gauss  REPEAT EXAM:  No  ______________________________ Jonette Eva, MD  CC:  n. eSIGNED:   Ashlyne Olenick at 07/29/2011  03:03 PM  Idamae Lusher, [id number]

## 2011-08-06 ENCOUNTER — Other Ambulatory Visit: Payer: Self-pay | Admitting: *Deleted

## 2011-08-06 MED ORDER — METFORMIN HCL 1000 MG PO TABS: 1000.0000 mg | ORAL_TABLET | Freq: Two times a day (BID) | ORAL | Status: DC

## 2011-08-06 MED ORDER — LOVASTATIN 20 MG PO TABS: 20.0000 mg | ORAL_TABLET | Freq: Every day | ORAL | Status: DC

## 2011-08-07 ENCOUNTER — Telehealth: Payer: Self-pay | Admitting: Gastroenterology

## 2011-08-07 NOTE — Telephone Encounter (Signed)
Per Dr. Jena Gauss. LMOM for pt to call. She needs OV with extender.

## 2011-08-07 NOTE — Telephone Encounter (Signed)
Routing to [facility] and [place].

## 2011-08-07 NOTE — Telephone Encounter (Signed)
Called pt and she stated she had abdominal pain, n/v and diarrhea all night last night. The pain was in the center of her stomach. She said she had about 10 episodes of the diarrhea. She is not having any abdominal pain now. But she is still nauseated. She drank a few sips of water about 15 min before I talked to her and it had stayed down at that time. Please advise!

## 2011-08-07 NOTE — Telephone Encounter (Signed)
PT IS A [facility] PT. 

## 2011-08-07 NOTE — Telephone Encounter (Signed)
Patient states shes having abdominal pain and vomiting last night an diarrhea very nauseated please advise

## 2011-08-08 NOTE — Telephone Encounter (Signed)
Called and informed pt. Darl Pikes is making an appt.

## 2011-08-09 NOTE — Telephone Encounter (Signed)
Pt is aware of OV on 4/26 at 10 with KJ

## 2011-08-10 ENCOUNTER — Encounter: Payer: Self-pay | Admitting: Urgent Care

## 2011-08-10 ENCOUNTER — Ambulatory Visit (INDEPENDENT_AMBULATORY_CARE_PROVIDER_SITE_OTHER): Payer: Medicare Other | Admitting: Urgent Care

## 2011-08-10 VITALS — BP 122/84 | HR 70 | Temp 97.5°F | Ht 64.0 in | Wt 190.0 lb

## 2011-08-10 DIAGNOSIS — R109 Unspecified abdominal pain: Secondary | ICD-10-CM | POA: Diagnosis not present

## 2011-08-10 DIAGNOSIS — K219 Gastro-esophageal reflux disease without esophagitis: Secondary | ICD-10-CM | POA: Diagnosis not present

## 2011-08-10 DIAGNOSIS — R197 Diarrhea, unspecified: Secondary | ICD-10-CM

## 2011-08-10 DIAGNOSIS — R5383 Other fatigue: Secondary | ICD-10-CM

## 2011-08-10 DIAGNOSIS — R5381 Other malaise: Secondary | ICD-10-CM

## 2011-08-10 DIAGNOSIS — R1310 Dysphagia, unspecified: Secondary | ICD-10-CM

## 2011-08-10 DIAGNOSIS — R1314 Dysphagia, pharyngoesophageal phase: Secondary | ICD-10-CM

## 2011-08-10 DIAGNOSIS — D509 Iron deficiency anemia, unspecified: Secondary | ICD-10-CM

## 2011-08-10 DIAGNOSIS — R112 Nausea with vomiting, unspecified: Secondary | ICD-10-CM

## 2011-08-10 LAB — CBC WITH DIFFERENTIAL/PLATELET
Basophils Absolute: 0 10*3/uL (ref 0.0–0.1)
Eosinophils Relative: 2 % (ref 0–5)
HCT: 41.8 % (ref 36.0–46.0)
Lymphocytes Relative: 38 % (ref 12–46)
MCV: 85.8 fL (ref 78.0–100.0)
Monocytes Absolute: 0.4 10*3/uL (ref 0.1–1.0)
Monocytes Relative: 4 % (ref 3–12)
RDW: 14.9 % (ref 11.5–15.5)
WBC: 8 10*3/uL (ref 4.0–10.5)

## 2011-08-10 NOTE — Progress Notes (Signed)
Primary Care Physician:  Vernice Jefferson, MD, MD Primary Gastroenterologist:  Roetta Sessions, MD  Chief Complaint  Patient presents with  . Diarrhea  . Emesis    HPI:  [name] is a 46 y.o. female here for further evaluation of abdominal pain, nausea, vomiting, and diarrhea. She had an EGD by Dr. Darrick Penna 07/17/2011 for dysphagia that showed chronic gastritis in the distal esophageal stricture. Approximately 2 weeks ago, she started to have vomiting & diarrhea.  Tells me she has had 4 episodes of diarrhea already this morning.  C/o generalized abdominal pain.  Pain intermittent, but last hours. She has vomited once in the past week.  C/o dark stools.  Not sure how many stools per day. Denies consuming alcohol or taking any street drugs, but she feels fatigued & drugged all the time.  Previous history of heavy alcohol use over the holidays noted in medical record. Denies fever or chills.  Thinks she took too much Geodon this AM for her nerves.  He has been on this medication on a regular basis. Denies dysuria.  Protonix twice daily. She has not checked her blood sugar this morning. Was on antibiotics within the last 6 months for pneumonia.  Past Medical History  Diagnosis Date  . Arteriosclerotic cardiovascular disease (ASCVD)     Minimal at cath in [facility].stress nuclear study in 8/08 with nl EF; neg stress echo in 2010  . Diabetes mellitus, type 2 2000    Onset in 2000; no insulin  . Hyperlipidemia   . Hypertension `    during treatment with Geodon  . Gastroesophageal reflux disease     Schatzki's ring  . Anemia, iron deficiency   . Alcohol abuse   . Depression   . Community acquired pneumonia 01/03/10    2011; with pleural effusion-[facility] acute resp failure  . Obesity   . Community acquired pneumonia 05/2010  . Schizoaffective disorder     requiring multiple psychiatric admissions  . Dysphagia     Past Surgical History  Procedure Date  . Dilation and  curettage, diagnostic / therapeutic 1992  . Esophagogastroduodenoscopy 09/16/08    Dr. Ronni Rumble hiatal hernia/excoriations involving the cardia and mucosa consistent with trauma, antral erosions  of linear petechiae ? gastritis versus early gastric antral vascular  ectasia.Marland Kitchen biopsy showed reactive gastropathy. No H. pylori.  . Esophagogastroduodenoscopy 09/2007    Dr. Rinaldo Ratel ring, dilated to 56 French Maloney dilator, small hiatal hernia, antral erosions, biopsies reactive gastropathy.  Gaspar Bidding dilation 07/17/2011    Fields-distal esophageal stricture s/p dilation, chronic gastritis   Current Outpatient Prescriptions  Medication Sig Dispense Refill  . cetirizine (ZYRTEC) 10 MG tablet Take 1 tablet (10 mg total) by mouth daily.  30 tablet  3  . diphenhydrAMINE (BENADRYL) 25 MG tablet Take 50 mg by mouth daily as needed. Sleep/allergies      . ferrous gluconate (FERGON) 240 (27 FE) MG tablet Take 240 mg by mouth 2 (two) times daily.      . fluticasone (FLONASE) 50 MCG/ACT nasal spray Place 2 sprays into the nose daily.  16 g  2  . hydrochlorothiazide (HYDRODIURIL) 25 MG tablet Take 1 tablet (25 mg total) by mouth daily.  31 tablet  1  . insulin glargine (LANTUS) 100 UNIT/ML injection Inject 25 Units into the skin at bedtime.  10 mL  11  . LORazepam (ATIVAN) 0.5 MG tablet Take 1-2 tablets (0.5-1 mg total) by mouth 2 (two) times daily as needed. For  anxiety  90 tablet  0  . lovastatin (MEVACOR) 20 MG tablet Take 1 tablet (20 mg total) by mouth at bedtime.  31 tablet  3  . metFORMIN (GLUCOPHAGE) 1000 MG tablet Take 1 tablet (1,000 mg total) by mouth 2 (two) times daily with a meal.  62 tablet  3  . metoprolol (LOPRESSOR) 50 MG tablet Take 1 tablet (50 mg total) by mouth 2 (two) times daily.  62 tablet  11  . pantoprazole (PROTONIX) 40 MG tablet 1 PO 30 MINUTES PRIOR TO MEALS BID  62 tablet  11  . potassium chloride SA (K-DUR,KLOR-CON) 20 MEQ tablet Take 20 mEq by mouth 2 (two) times daily.         . ziprasidone (GEODON) 40 MG capsule Take 40 mg by mouth every morning.       . ziprasidone (GEODON) 60 MG capsule Take 60 mg by mouth at bedtime.         Allergies  Allergen Reactions  . Orange Shortness Of Breath and Itching  . Shrimp (Shellfish Allergy) Shortness Of Breath and Itching    Takes Benadryl before eating shrimp.  Marland Kitchen Penicillins Rash    Fever as well  . Sulfonamide Derivatives Rash    fever  . Glipizide Other (See Comments)    psychosis  . Metronidazole Swelling  . Sulfamethoxazole W/Trimethoprim Rash  Review of Systems: Gen: Denies any malaise, weight loss, and sleep disorder CV: Denies chest pain, angina, palpitations, syncope, orthopnea, PND, peripheral edema, and claudication. Resp: Denies dyspnea at rest, dyspnea with exercise, cough, sputum, wheezing, coughing up blood, and pleurisy. GI: Denies vomiting blood, jaundice, and fecal incontinence.   Denies dysphagia or odynophagia. Derm: Denies rash, itching, dry skin, hives, moles, warts, or unhealing ulcers.  Psych: Denies depression, anxiety, memory loss, suicidal ideation, hallucinations, paranoia, and confusion. Heme: Denies bruising, bleeding, and enlarged lymph nodes.  Physical Exam: BP 122/84  Pulse 70  Temp(Src) 97.5 F (36.4 C) (Temporal)  Ht 5\' 4"  (1.626 m)  Wt 190 lb (86.183 kg)  BMI 32.61 kg/m2  SpO2 97%  LMP 08/01/2011 General:   Awake,  Well-developed, well-nourished, pleasant and cooperative.  At times during the interview, she closes her eyes inappropriately, however she responds to stimulus and is able to angulate without difficulty and answers questions appropriately.  Head:  Normocephalic and atraumatic. Eyes:  Sclera clear, no icterus.   Conjunctiva pink. Mouth:  No deformity or lesions.  Oropharynx pink & moist. Neck:  Supple; no masses or thyromegaly. Heart:  Regular rate and rhythm; no murmurs, clicks, rubs,  or gallops. Abdomen:  Soft, nondistended. Moderate tenderness to  palpation to her entire abdomen. No masses, hepatosplenomegaly or hernias noted. Normal bowel sounds, without guarding, and without rebound.   Msk:  Symmetrical without gross deformities. Normal posture. Pulses:  Normal pulses noted. Extremities:  Without clubbing or edema. Neurologic:  Alert and  oriented x4;  Skin:  Intact without significant lesions or rashes. Cervical Nodes:  No significant cervical adenopathy.

## 2011-08-10 NOTE — Patient Instructions (Addendum)
Please go directly to to the lab today to get your blood work Return your stool studies to the lab as soon as possible You will need CT scan of your abdomen & pelvis- we will call you with results If you have severe abdominal pain, dizziness, unable to keep down fluids or food, bleeding or fever over the weekend, you need to go to the emergency department ALIGN daily for your diarrhea Drink plenty fluids

## 2011-08-10 NOTE — Assessment & Plan Note (Signed)
Improved status post dilation 4/2

## 2011-08-10 NOTE — Assessment & Plan Note (Addendum)
[name] is a pleasant 46 y.o. female who presents with three-week history of abdominal pain, nausea, vomiting, fatigue and diarrhea. Recent antibiotic use.  Difficult to obtain a history from [name] as at times throughout the interview she closes her eyes inappropriately, however otherwise neurological exam is okay. She is able to ambulate without difficulty. This may be due to her schizoaffective disorder, but cannot rule out polysubstance abuse or metabolic cause. Differentials include C. difficile colitis or acute infectious colitis, pancreatitis, diverticulitis, urinary tract infection.  CBC, CMP, TSH, lipase Full set of stool studies Serum alcohol and urine drug screen UA and urine pregnancy CT scan abdomen and pelvis with IV normal contrast If you have severe abdominal pain, dizziness, unable to keep down fluids or food, bleeding or fever over the weekend, you need to go to the emergency department ALIGN daily for your diarrhea Drink plenty fluids

## 2011-08-10 NOTE — Assessment & Plan Note (Signed)
See ABD pain 

## 2011-08-11 LAB — DRUG SCREEN, URINE
Benzodiazepines.: NEGATIVE
Cocaine Metabolites: NEGATIVE
Creatinine,U: 77.47 mg/dL
Marijuana Metabolite: NEGATIVE
Opiates: NEGATIVE
Phencyclidine (PCP): NEGATIVE

## 2011-08-11 LAB — COMPREHENSIVE METABOLIC PANEL
AST: 11 U/L (ref 0–37)
Albumin: 4.3 g/dL (ref 3.5–5.2)
BUN: 11 mg/dL (ref 6–23)
CO2: 23 mEq/L (ref 19–32)
Calcium: 9.6 mg/dL (ref 8.4–10.5)
Chloride: 104 mEq/L (ref 96–112)
Creat: 0.78 mg/dL (ref 0.50–1.10)
Glucose, Bld: 206 mg/dL — ABNORMAL HIGH (ref 70–99)
Potassium: 4.1 mEq/L (ref 3.5–5.3)
Total Protein: 6.6 g/dL (ref 6.0–8.3)

## 2011-08-11 LAB — URINALYSIS W MICROSCOPIC + REFLEX CULTURE
Crystals: NONE SEEN
Nitrite: NEGATIVE
Specific Gravity, Urine: 1.013 (ref 1.005–1.030)
Squamous Epithelial / LPF: NONE SEEN
Urobilinogen, UA: 0.2 mg/dL (ref 0.0–1.0)

## 2011-08-11 LAB — PREGNANCY, URINE: Preg Test, Ur: NEGATIVE

## 2011-08-11 LAB — LIPASE: Lipase: 20 U/L (ref 0–75)

## 2011-08-13 ENCOUNTER — Ambulatory Visit (HOSPITAL_COMMUNITY)
Admission: RE | Admit: 2011-08-13 | Discharge: 2011-08-13 | Disposition: A | Discharge status: Home / Self Care | Payer: Medicare Other | Source: Ambulatory Visit | Attending: Urgent Care | Admitting: Urgent Care

## 2011-08-13 ENCOUNTER — Other Ambulatory Visit: Payer: Self-pay | Admitting: *Deleted

## 2011-08-13 DIAGNOSIS — R112 Nausea with vomiting, unspecified: Secondary | ICD-10-CM | POA: Insufficient documentation

## 2011-08-13 DIAGNOSIS — R197 Diarrhea, unspecified: Secondary | ICD-10-CM | POA: Diagnosis not present

## 2011-08-13 DIAGNOSIS — E119 Type 2 diabetes mellitus without complications: Secondary | ICD-10-CM | POA: Insufficient documentation

## 2011-08-13 DIAGNOSIS — I10 Essential (primary) hypertension: Secondary | ICD-10-CM | POA: Insufficient documentation

## 2011-08-13 DIAGNOSIS — R109 Unspecified abdominal pain: Secondary | ICD-10-CM | POA: Insufficient documentation

## 2011-08-13 DIAGNOSIS — F1010 Alcohol abuse, uncomplicated: Secondary | ICD-10-CM | POA: Insufficient documentation

## 2011-08-13 DIAGNOSIS — R933 Abnormal findings on diagnostic imaging of other parts of digestive tract: Secondary | ICD-10-CM | POA: Insufficient documentation

## 2011-08-13 MED ORDER — HYDROCHLOROTHIAZIDE 25 MG PO TABS: 25.0000 mg | ORAL_TABLET | Freq: Every day | ORAL | Status: DC

## 2011-08-13 MED ORDER — IOHEXOL 300 MG/ML  SOLN
100.0000 mL | Freq: Once | INTRAMUSCULAR | Status: AC | PRN
  Administered 2011-08-13: 100 mL via INTRAVENOUS

## 2011-08-13 NOTE — Progress Notes (Signed)
Faxed to PCP

## 2011-08-15 DIAGNOSIS — E119 Type 2 diabetes mellitus without complications: Secondary | ICD-10-CM | POA: Diagnosis not present

## 2011-08-16 ENCOUNTER — Encounter: Payer: Self-pay | Admitting: Internal Medicine

## 2011-08-16 ENCOUNTER — Ambulatory Visit (INDEPENDENT_AMBULATORY_CARE_PROVIDER_SITE_OTHER): Payer: Medicare Other | Admitting: Internal Medicine

## 2011-08-16 VITALS — BP 123/82 | HR 94 | Temp 98.5°F | Ht 64.0 in | Wt 193.8 lb

## 2011-08-16 DIAGNOSIS — F3289 Other specified depressive episodes: Secondary | ICD-10-CM

## 2011-08-16 DIAGNOSIS — F329 Major depressive disorder, single episode, unspecified: Secondary | ICD-10-CM

## 2011-08-16 DIAGNOSIS — J309 Allergic rhinitis, unspecified: Secondary | ICD-10-CM

## 2011-08-16 DIAGNOSIS — I10 Essential (primary) hypertension: Secondary | ICD-10-CM

## 2011-08-16 DIAGNOSIS — J302 Other seasonal allergic rhinitis: Secondary | ICD-10-CM

## 2011-08-16 DIAGNOSIS — F17200 Nicotine dependence, unspecified, uncomplicated: Secondary | ICD-10-CM

## 2011-08-16 DIAGNOSIS — K219 Gastro-esophageal reflux disease without esophagitis: Secondary | ICD-10-CM

## 2011-08-16 DIAGNOSIS — E119 Type 2 diabetes mellitus without complications: Secondary | ICD-10-CM

## 2011-08-16 DIAGNOSIS — R197 Diarrhea, unspecified: Secondary | ICD-10-CM

## 2011-08-16 DIAGNOSIS — R112 Nausea with vomiting, unspecified: Secondary | ICD-10-CM | POA: Diagnosis not present

## 2011-08-16 DIAGNOSIS — E669 Obesity, unspecified: Secondary | ICD-10-CM

## 2011-08-16 MED ORDER — FLUTICASONE PROPIONATE 50 MCG/ACT NA SUSP: 2.0000 | Freq: Every day | NASAL | Status: DC

## 2011-08-16 MED ORDER — INSULIN PEN NEEDLE 32G X 8 MM MISC: 1.0000 [IU] | Freq: Every day | Status: DC

## 2011-08-16 MED ORDER — POTASSIUM CHLORIDE CRYS ER 20 MEQ PO TBCR: 20.0000 meq | EXTENDED_RELEASE_TABLET | Freq: Two times a day (BID) | ORAL | Status: DC

## 2011-08-16 NOTE — Assessment & Plan Note (Signed)
Notes no nausea and vomiting since prior to her EGD, which was on 07/17/2011. EGD revealed distal esophageal stricture, which was likely contributing. She had a normal gastric emptying study done 19 months ago.

## 2011-08-16 NOTE — Patient Instructions (Signed)
-  Please try to take your blood sugar at least every morning.  -Please discontinue apple juice, as this is not only increasing your sugars, but may also be contributing to you diarrhea.  -Please be sure to bring all of your medications with you to every visit.  -Should you have any new or worsening symptoms, please be sure to call the clinic at [phone].

## 2011-08-16 NOTE — Assessment & Plan Note (Addendum)
Being followed by GI. Improved after starting align.  If diarrhea does not improve after GI assessment and treatment, may consider discontinuing metformin, though I hesitate to do this in a young woman with diabetes.

## 2011-08-16 NOTE — Assessment & Plan Note (Addendum)
Lab Results  Component Value Date   HGBA1C 7.7 07/06/2011   HGBA1C  Value: 7.9 (NOTE)                                                                       According to the ADA Clinical Practice Recommendations for 2011, when HbA1c is used as a screening test:   >=6.5%   Diagnostic of Diabetes Mellitus           (if abnormal result  is confirmed)  5.7-6.4%   Increased risk of developing Diabetes Mellitus  References:Diagnosis and Classification of Diabetes Mellitus,Diabetes Care,2011,34(Suppl 1):S62-S69 and Standards of Medical Care in         Diabetes - 2011,Diabetes Care,2011,34  (Suppl 1):S11-S61.* 04/18/2010   CREATININE 0.78 08/10/2011   CREATININE 0.70 07/12/2011   MICROALBUR 0.50 12/27/2010   MICRALBCREAT 3.8 12/27/2010   CHOL 164 12/27/2010   HDL 43 12/27/2010   TRIG 180* 12/27/2010    Last eye exam and foot exam:   Foot exam within the last year, eye exam was yesterday, and records have been requested.   Assessment: Diabetes control: not controlled Progress toward goals: improved Barriers to meeting goals: lack of understanding of disease management  Plan: Diabetes treatment: continue current medications Refer to: none Instruction/counseling given: reminded to bring blood glucose meter & log to each visit, reminded to bring medications to each visit, discussed the need for weight loss and discussed diet; urge patient to discontinue apple juice

## 2011-08-16 NOTE — Assessment & Plan Note (Signed)
Lab Results  Component Value Date   NA 135 08/10/2011   K 4.1 08/10/2011   CL 104 08/10/2011   CO2 23 08/10/2011   BUN 11 08/10/2011   CREATININE 0.78 08/10/2011   CREATININE 0.70 07/12/2011    BP Readings from Last 3 Encounters:  08/16/11 123/82  08/10/11 122/84  07/17/11 140/91    Assessment: Hypertension control:  controlled  Progress toward goals:  at goal Barriers to meeting goals:  no barriers identified  Plan: Hypertension treatment:  continue current medications

## 2011-08-16 NOTE — Assessment & Plan Note (Signed)
Patient continues to smoke about 1.5 packs per day.  She is interested in quitting and has tried nicotine patch in the past as well as quitting cold [place].  She is interested in help from social work.  She tells me that though she was alone, her son smokes and and he visits daily.

## 2011-08-16 NOTE — Assessment & Plan Note (Signed)
Patient is being followed at the [facility], and they are prescribing Ativan and Geodon.  She believes she has PTSD, chart review suggests either schizoaffective d/o.    -I will refer patient social work as I believe she would benefit from psychotherapy.  [facility] that [facility] is [facility] to get psychotherapy.

## 2011-08-16 NOTE — Assessment & Plan Note (Signed)
Improved after esophageal dilatation.  -Continue Protonix

## 2011-08-16 NOTE — Progress Notes (Signed)
Subjective:   Patient ID: [name] female   DOB: 1966/01/25 46 y.o.   MRN: [id number]  HPI: [name] is a 46 y.o. woman with history of diabetes, hypertension, hyperlipidemia, GERD, tobacco abuse, and depression. She presents today for diabetes followup.    She checks her blood sugars infrequently. Highest blood sugar has been noted to be around 300, lowest blood sugars in the low 100s.  He does not have a glucometer with her today. She denies symptoms of hypoglycemia such as diaphoresis & dizziness.  He denies symptoms of hyperglycemia such as polyuria polydipsia and polyphagia. She does note recent headaches do 2 seasonal allergies. Headache is better when she starts taking allergy medications.  Her diet includes baked meats, though she recently was eating a lot of fried chicken. She also has recently increased her apple juice intake.  She tells me she does still smoke, about 1.5 packs per day. He has quit several times in the past including by a nicotine patch and cold [place].  Though she lives alone her son visits daily and he smokes.  Smoking cessation is difficult for her as she craves cigarettes first thing in the morning.  She tells me that her GI symptoms have improved since esophageal dilation. She has not vomited since prior to dilation. Diarrhea has improved with treatment with align.  Number of bowel movements per day varies significantly. Some days she has 5 bowel movements prior to breakfast.   Current Outpatient Prescriptions  Medication Sig Dispense Refill  . cetirizine (ZYRTEC) 10 MG tablet Take 1 tablet (10 mg total) by mouth daily.  30 tablet  3  . diphenhydrAMINE (BENADRYL) 25 MG tablet Take 50 mg by mouth daily as needed. Sleep/allergies      . ferrous gluconate (FERGON) 240 (27 FE) MG tablet Take 240 mg by mouth 2 (two) times daily.      . fluticasone (FLONASE) 50 MCG/ACT nasal spray Place 2 sprays into the nose daily.  16 g  2  . hydrochlorothiazide  (HYDRODIURIL) 25 MG tablet Take 1 tablet (25 mg total) by mouth daily.  31 tablet  2  . insulin glargine (LANTUS) 100 UNIT/ML injection Inject 25 Units into the skin at bedtime.  10 mL  11  . LORazepam (ATIVAN) 0.5 MG tablet Take 1-2 tablets (0.5-1 mg total) by mouth 2 (two) times daily as needed. For anxiety  90 tablet  0  . lovastatin (MEVACOR) 20 MG tablet Take 1 tablet (20 mg total) by mouth at bedtime.  31 tablet  3  . metFORMIN (GLUCOPHAGE) 1000 MG tablet Take 1 tablet (1,000 mg total) by mouth 2 (two) times daily with a meal.  62 tablet  3  . metoprolol (LOPRESSOR) 50 MG tablet Take 1 tablet (50 mg total) by mouth 2 (two) times daily.  62 tablet  11  . pantoprazole (PROTONIX) 40 MG tablet 1 PO 30 MINUTES PRIOR TO MEALS BID  62 tablet  11  . potassium chloride SA (K-DUR,KLOR-CON) 20 MEQ tablet Take 1 tablet (20 mEq total) by mouth 2 (two) times daily.  60 tablet  4  . ziprasidone (GEODON) 40 MG capsule Take 40 mg by mouth every morning.       . ziprasidone (GEODON) 60 MG capsule Take 60 mg by mouth at bedtime.        Marland Kitchen DISCONTD: fluticasone (FLONASE) 50 MCG/ACT nasal spray Place 2 sprays into the nose daily.  16 g  2  . DISCONTD: potassium chloride  SA (K-DUR,KLOR-CON) 20 MEQ tablet Take 20 mEq by mouth 2 (two) times daily.        . Insulin Pen Needle 32G X 8 MM MISC 1 Units by Does not apply route daily before breakfast.  100 each  11    Review of Systems: Constitutional: Denies fever, chills, diaphoresis, appetite change and fatigue.  HEENT: Denies photophobia, eye pain, redness, hearing loss, ear pain, congestion, sore throat, rhinorrhea, sneezing, mouth sores, trouble swallowing, neck pain, neck stiffness and tinnitus.   Respiratory: Denies SOB, DOE, cough, chest tightness,  and wheezing.   Cardiovascular: Denies chest pain, palpitations and leg swelling.  Gastrointestinal: As per history of present illness Genitourinary: Denies dysuria, urgency, frequency, hematuria, flank pain and  difficulty urinating.  Musculoskeletal: Denies myalgias, back pain, joint swelling, arthralgias and gait problem.  Neurological: Denies dizziness, seizures, syncope, weakness, light-headedness, numbness and headaches.  Psychiatric/Behavioral: Denies suicidal ideation, mood changes, confusion, nervousness, sleep disturbance and agitation -seems to be doing well with current medications  Objective:  Physical Exam: Filed Vitals:   08/16/11 1344  BP: 123/82  Pulse: 94  Temp: 98.5 F (36.9 C)  TempSrc: Oral  Height: 5\' 4"  (1.626 m)  Weight: 193 lb 12.8 oz (87.907 kg)   Constitutional: Vital signs reviewed.  Patient is a well-developed and well-nourished woman in no acute distress and cooperative with exam.  Mouth: no erythema or exudates, dry MM Eyes: PERRL, EOMI, conjunctivae normal, No scleral icterus.  Neck: No thyromegaly Cardiovascular: RRR, S1 normal, S2 normal, no MRG, pulses symmetric and intact bilaterally Pulmonary/Chest: CTAB, no wheezes, rales, or rhonchi Abdominal: Soft. Non-tender, non-distended, bowel sounds are normal, no masses, organomegaly, or guarding present.  Musculoskeletal: No joint deformities, erythema, or stiffness, ROM full and no nontender Neurological: A&O x3, Strength is normal and symmetric bilaterally, cranial nerve II-XII are grossly intact, no focal motor deficit, sensory intact to light touch bilaterally.  Skin: Warm, dry and intact. No rash, cyanosis, or clubbing.  Psychiatric: Normal mood and affect. speech and behavior is normal. Judgment and thought content normal. Cognition and memory are normal.   Assessment & Plan:  Case and care discussed with Dr. Rogelia Boga.  Patient to return in early July for diabetes followup. Please see problem-oriented charting for further details.

## 2011-08-16 NOTE — Assessment & Plan Note (Signed)
Encouraged her to maintain exercise regimen and healthy diet.

## 2011-08-20 NOTE — Progress Notes (Signed)
Quick Note:  Please send letter to pt regarding turning in her stool studies. Thanks ______

## 2011-08-24 ENCOUNTER — Telehealth: Payer: Self-pay | Admitting: Licensed Clinical Social Worker

## 2011-08-24 NOTE — Telephone Encounter (Signed)
Ms. Batdorf has been referred to CSW for psychotherapy referrals and smoking cessation.  Pt utilizes [facility] for [employer], but does not participate in their Group therapy sessions.  During MD visit, pt was open to receiving individual therapy, however, today pt states she has changed her mind.  Pt is ready to quit smoking and set a 30 day goal. Pt currently smokes [place] and has her first cigarette within 5 minutes of waking.   CSW discussed Brand Switching with pt and then utilizing NRT after her 30 day goal.  Pt current smokes Newports with at noted 1.5-1.6 mg of Nicotine, pt to switch to [employer]. [place].   Pt to no longer purchase [place] and switch to [employer] one box at a time.  Pt to attempt to wait 30 minutes prior to having her first cigarette in the morning.  CSW will follow up with pt next week regarding accomplishing first set of goals.   CSW has listing of two providers in [place] that accept pt's insurance and provides Individual Counseling.  CSW will mail out to pt as a resources in case pt decides she is ready for individual therapy.

## 2011-08-30 ENCOUNTER — Encounter: Payer: Self-pay | Admitting: Dietician

## 2011-08-30 ENCOUNTER — Telehealth: Payer: Self-pay | Admitting: Dietician

## 2011-08-30 NOTE — Telephone Encounter (Signed)
Called patient about quitting smoking- [facility] social worker and she are working together on this. See previous phone note.

## 2011-09-12 ENCOUNTER — Telehealth: Payer: Self-pay | Admitting: Gastroenterology

## 2011-09-12 NOTE — Telephone Encounter (Signed)
Patient cant afford her protonix and she needs something to take equivalent, omeprazole does not work, she says protonix works she just cant afford it please advise

## 2011-09-13 ENCOUNTER — Encounter: Payer: Medicare Other | Admitting: Ophthalmology

## 2011-09-13 ENCOUNTER — Telehealth: Payer: Self-pay | Admitting: *Deleted

## 2011-09-13 ENCOUNTER — Encounter (HOSPITAL_COMMUNITY): Payer: Self-pay | Admitting: Emergency Medicine

## 2011-09-13 ENCOUNTER — Emergency Department (HOSPITAL_COMMUNITY)
Admission: EM | Admit: 2011-09-13 | Discharge: 2011-09-13 | Disposition: A | Discharge status: Home / Self Care | Payer: Medicare Other | Attending: Emergency Medicine | Admitting: Emergency Medicine

## 2011-09-13 DIAGNOSIS — Z79899 Other long term (current) drug therapy: Secondary | ICD-10-CM | POA: Diagnosis not present

## 2011-09-13 DIAGNOSIS — J3489 Other specified disorders of nose and nasal sinuses: Secondary | ICD-10-CM | POA: Insufficient documentation

## 2011-09-13 DIAGNOSIS — F259 Schizoaffective disorder, unspecified: Secondary | ICD-10-CM | POA: Insufficient documentation

## 2011-09-13 DIAGNOSIS — D509 Iron deficiency anemia, unspecified: Secondary | ICD-10-CM | POA: Diagnosis not present

## 2011-09-13 DIAGNOSIS — E119 Type 2 diabetes mellitus without complications: Secondary | ICD-10-CM | POA: Insufficient documentation

## 2011-09-13 DIAGNOSIS — E669 Obesity, unspecified: Secondary | ICD-10-CM | POA: Insufficient documentation

## 2011-09-13 DIAGNOSIS — F17200 Nicotine dependence, unspecified, uncomplicated: Secondary | ICD-10-CM | POA: Diagnosis not present

## 2011-09-13 DIAGNOSIS — Z794 Long term (current) use of insulin: Secondary | ICD-10-CM | POA: Diagnosis not present

## 2011-09-13 DIAGNOSIS — I10 Essential (primary) hypertension: Secondary | ICD-10-CM | POA: Diagnosis not present

## 2011-09-13 DIAGNOSIS — R51 Headache: Secondary | ICD-10-CM | POA: Diagnosis not present

## 2011-09-13 DIAGNOSIS — E785 Hyperlipidemia, unspecified: Secondary | ICD-10-CM | POA: Diagnosis not present

## 2011-09-13 DIAGNOSIS — K219 Gastro-esophageal reflux disease without esophagitis: Secondary | ICD-10-CM | POA: Insufficient documentation

## 2011-09-13 DIAGNOSIS — R0981 Nasal congestion: Secondary | ICD-10-CM

## 2011-09-13 MED ORDER — METOCLOPRAMIDE HCL 5 MG/ML IJ SOLN: 10.0000 mg | Freq: Once | INTRAMUSCULAR | Status: DC

## 2011-09-13 MED ORDER — METOCLOPRAMIDE HCL 10 MG PO TABS
10.0000 mg | ORAL_TABLET | Freq: Once | ORAL | Status: AC
  Administered 2011-09-13: 10 mg via ORAL
  Filled 2011-09-13: qty 1

## 2011-09-13 MED ORDER — DIPHENHYDRAMINE HCL 50 MG/ML IJ SOLN: 25.0000 mg | Freq: Once | INTRAMUSCULAR | Status: DC

## 2011-09-13 MED ORDER — OXYCODONE-ACETAMINOPHEN 5-325 MG PO TABS
1.0000 | ORAL_TABLET | Freq: Once | ORAL | Status: AC
  Administered 2011-09-13: 1 via ORAL
  Filled 2011-09-13: qty 1

## 2011-09-13 NOTE — ED Notes (Signed)
Discharge instructions given and reviewed with patient.  Patient verbalized understanding to keep MD appointments as scheduled.  Patient ambulatory; discharged home in good condition.

## 2011-09-13 NOTE — Telephone Encounter (Signed)
Pt called went to [facility] ER this AM - 1AM for sinus. Had a Percocet on hand. Wanted refill. Took Tylenol this AM - so far doing ok. Was not given antibiotics from ER. Appt given 09/13/11 3:15PM Dr Maisie Fus. Stanton Kidney Nikolette Reindl RN 09/13/11 10:25AM

## 2011-09-13 NOTE — ED Provider Notes (Signed)
History     CSN: [id number]  Arrival date & time 09/13/11  0145   First MD Initiated Contact with Patient 09/13/11 0153      Chief Complaint  Patient presents with  . Headache     Patient is a 46 y.o. female presenting with headaches. The history is provided by the patient.  Headache  This is a new problem. The current episode started 6 to 12 hours ago. The problem occurs constantly. The problem has been gradually worsening. Associated with: nasal congestion. The pain is located in the frontal region. The pain is moderate. The pain does not radiate. Associated symptoms include nausea. Pertinent negatives include no fever. She has tried acetaminophen for the symptoms. The treatment provided no relief.  pt reports frontal headache and nasal congestion and nose pain Also reports mild cough She feels she has sinus infection Similar to prior infections Headache gradual in onset She has tried benadryl earlier and also tried APAP >4hrs ago without relief No focal weakness No fever reported  Past Medical History  Diagnosis Date  . Arteriosclerotic cardiovascular disease (ASCVD)     Minimal at cath in [facility].stress nuclear study in 8/08 with nl EF; neg stress echo in 2010  . Diabetes mellitus, type 2 2000    Onset in 2000; no insulin  . Hyperlipidemia   . Hypertension `    during treatment with Geodon  . Gastroesophageal reflux disease     Schatzki's ring  . Anemia, iron deficiency   . Alcohol abuse   . Depression   . Community acquired pneumonia 01/03/10    2011; with pleural effusion-[facility] acute resp failure  . Obesity   . Community acquired pneumonia 05/2010  . Schizoaffective disorder     requiring multiple psychiatric admissions  . Dysphagia     Past Surgical History  Procedure Date  . Dilation and curettage, diagnostic / therapeutic 1992  . Esophagogastroduodenoscopy 09/16/08    Dr. Ronni Rumble hiatal hernia/excoriations involving the cardia and  mucosa consistent with trauma, antral erosions  of linear petechiae ? gastritis versus early gastric antral vascular  ectasia.Marland Kitchen biopsy showed reactive gastropathy. No H. pylori.  . Esophagogastroduodenoscopy 09/2007    Dr. Rinaldo Ratel ring, dilated to 56 French Maloney dilator, small hiatal hernia, antral erosions, biopsies reactive gastropathy.  Gaspar Bidding dilation 07/17/2011    Fields-distal esophageal stricture s/p dilation, chronic gastritis    Family History  Problem Relation Age of Onset  . Colon cancer Other   . Hypertension Mother   . Stroke Father     deceased at age 4  . Heart disease Sister   . Anesthesia problems Neg Hx   . Hypotension Neg Hx   . Malignant hyperthermia Neg Hx   . Pseudochol deficiency Neg Hx     History  Substance Use Topics  . Smoking status: Current Everyday Smoker -- 1.5 packs/day for 30 years    Types: Cigarettes  . Smokeless tobacco: Not on file   Comment: Sometimes she smokes more; up to 2 packs/day.  . Alcohol Use: No     occasional; intake has decreased since she found out she has ulcer    OB History    Grav Para Term Preterm Abortions TAB SAB Ect Mult Living                  Review of Systems  Constitutional: Negative for fever.  Gastrointestinal: Positive for nausea.  Neurological: Positive for headaches.    Allergies  Orange;  Shrimp; Penicillins; Sulfonamide derivatives; Glipizide; Metronidazole; and Sulfamethoxazole w-trimethoprim  Home Medications   Current Outpatient Rx  Name Route Sig Dispense Refill  . CETIRIZINE HCL 10 MG PO TABS Oral Take 1 tablet (10 mg total) by mouth daily. 30 tablet 3  . DIPHENHYDRAMINE HCL 25 MG PO TABS Oral Take 50 mg by mouth daily as needed. Sleep/allergies    . FERROUS GLUCONATE 240 (27 FE) MG PO TABS Oral Take 240 mg by mouth 2 (two) times daily.    Marland Kitchen FLUTICASONE PROPIONATE 50 MCG/ACT NA SUSP Nasal Place 2 sprays into the nose daily. 16 g 2  . HYDROCHLOROTHIAZIDE 25 MG PO TABS Oral Take 1  tablet (25 mg total) by mouth daily. 31 tablet 2  . INSULIN GLARGINE 100 UNIT/ML [place] SOLN Subcutaneous Inject 25 Units into the skin at bedtime. 10 mL 11  . INSULIN PEN NEEDLE 32G X 8 MM MISC Does not apply 1 Units by Does not apply route daily before breakfast. 100 each 11  . LORAZEPAM 0.5 MG PO TABS Oral Take 1-2 tablets (0.5-1 mg total) by mouth 2 (two) times daily as needed. For anxiety 90 tablet 0  . LOVASTATIN 20 MG PO TABS Oral Take 1 tablet (20 mg total) by mouth at bedtime. 31 tablet 3  . METFORMIN HCL 1000 MG PO TABS Oral Take 1 tablet (1,000 mg total) by mouth 2 (two) times daily with a meal. 62 tablet 3  . METOPROLOL TARTRATE 50 MG PO TABS Oral Take 1 tablet (50 mg total) by mouth 2 (two) times daily. 62 tablet 11  . PANTOPRAZOLE SODIUM 40 MG PO TBEC  1 PO 30 MINUTES PRIOR TO MEALS BID 62 tablet 11  . POTASSIUM CHLORIDE CRYS ER 20 MEQ PO TBCR Oral Take 1 tablet (20 mEq total) by mouth 2 (two) times daily. 60 tablet 4  . ZIPRASIDONE HCL 40 MG PO CAPS Oral Take 40 mg by mouth every morning.     Marland Kitchen ZIPRASIDONE HCL 60 MG PO CAPS Oral Take 60 mg by mouth at bedtime.        BP 153/98  Pulse 80  Temp(Src) 98.3 F (36.8 C) (Oral)  Resp 18  Ht 5\' 4"  (1.626 m)  Wt 188 lb (85.276 kg)  BMI 32.27 kg/m2  SpO2 100%  LMP 08/21/2011  Physical Exam CONSTITUTIONAL: Well developed/well nourished HEAD AND FACE: Normocephalic/atraumatic EYES: EOMI/PERRL ENMT: Mucous membranes moist, nasal congestion noted NECK: supple no meningeal signs CV: S1/S2 noted, no murmurs/rubs/gallops noted LUNGS: Lungs are clear to auscultation bilaterally, no apparent distress ABDOMEN: soft, nontender, no rebound or guarding NEURO: Pt is awake/alert, moves all extremitiesx4, gait normal, no arm/leg drift, face is symmetric EXTREMITIES: pulses normal, full ROM SKIN: warm, color normal PSYCH: no abnormalities of mood noted  ED Course  Procedures     1. Nasal congestion   2. Headache       MDM  Nursing  notes reviewed and considered in documentation  Pt well appearing, she is reporting sinus type headache that just began Advised use of flonase Stable for d/c  The patient appears reasonably screened and/or stabilized for discharge and I doubt any other medical condition or other [facility] requiring further screening, evaluation, or treatment in the ED at this time prior to discharge.         Joya Gaskins, MD 09/13/11 [phone]

## 2011-09-13 NOTE — Telephone Encounter (Signed)
PLEASE ADDRESS WITH PT. 

## 2011-09-13 NOTE — ED Notes (Signed)
Patient c/o headache since 8pm; states also vomiting with headache.

## 2011-09-13 NOTE — ED Notes (Signed)
Pt reports taking 2 Benadryl 25mg  tablets at home at 0100.  Dr. Bebe Shaggy notified and orders were changed.

## 2011-09-13 NOTE — Discharge Instructions (Signed)

## 2011-09-14 NOTE — Telephone Encounter (Signed)
LMOM for a return call.  

## 2011-09-17 ENCOUNTER — Emergency Department (HOSPITAL_COMMUNITY)
Admission: EM | Admit: 2011-09-17 | Discharge: 2011-09-17 | Disposition: A | Discharge status: Home / Self Care | Payer: Medicare Other | Attending: Emergency Medicine | Admitting: Emergency Medicine

## 2011-09-17 ENCOUNTER — Encounter (HOSPITAL_COMMUNITY): Payer: Self-pay | Admitting: *Deleted

## 2011-09-17 DIAGNOSIS — K219 Gastro-esophageal reflux disease without esophagitis: Secondary | ICD-10-CM | POA: Diagnosis not present

## 2011-09-17 DIAGNOSIS — E119 Type 2 diabetes mellitus without complications: Secondary | ICD-10-CM | POA: Insufficient documentation

## 2011-09-17 DIAGNOSIS — I10 Essential (primary) hypertension: Secondary | ICD-10-CM | POA: Insufficient documentation

## 2011-09-17 DIAGNOSIS — E785 Hyperlipidemia, unspecified: Secondary | ICD-10-CM | POA: Diagnosis not present

## 2011-09-17 DIAGNOSIS — E860 Dehydration: Secondary | ICD-10-CM | POA: Diagnosis not present

## 2011-09-17 DIAGNOSIS — R112 Nausea with vomiting, unspecified: Secondary | ICD-10-CM | POA: Insufficient documentation

## 2011-09-17 DIAGNOSIS — Z8659 Personal history of other mental and behavioral disorders: Secondary | ICD-10-CM | POA: Insufficient documentation

## 2011-09-17 DIAGNOSIS — Z79899 Other long term (current) drug therapy: Secondary | ICD-10-CM | POA: Insufficient documentation

## 2011-09-17 DIAGNOSIS — R197 Diarrhea, unspecified: Secondary | ICD-10-CM | POA: Insufficient documentation

## 2011-09-17 LAB — CBC
HCT: 37.8 % (ref 36.0–46.0)
MCH: 27.9 pg (ref 26.0–34.0)
MCHC: 33.3 g/dL (ref 30.0–36.0)
MCV: 83.6 fL (ref 78.0–100.0)
Platelets: 238 10*3/uL (ref 150–400)
RDW: 15 % (ref 11.5–15.5)

## 2011-09-17 LAB — COMPREHENSIVE METABOLIC PANEL
AST: 10 U/L (ref 0–37)
CO2: 22 mEq/L (ref 19–32)
Calcium: 9.9 mg/dL (ref 8.4–10.5)
Creatinine, Ser: 0.69 mg/dL (ref 0.50–1.10)
GFR calc non Af Amer: >90 mL/min (ref >90)
Sodium: 134 mEq/L — ABNORMAL LOW (ref 135–145)
Total Protein: 7 g/dL (ref 6.0–8.3)

## 2011-09-17 LAB — URINALYSIS, ROUTINE W REFLEX MICROSCOPIC
Glucose, UA: NEGATIVE mg/dL
Leukocytes, UA: NEGATIVE
Protein, ur: NEGATIVE mg/dL
Specific Gravity, Urine: 1.025 (ref 1.005–1.030)
Urobilinogen, UA: 0.2 mg/dL (ref 0.0–1.0)

## 2011-09-17 LAB — DIFFERENTIAL
Basophils Absolute: 0 10*3/uL (ref 0.0–0.1)
Basophils Relative: 0 % (ref 0–1)
Eosinophils Absolute: 0.2 10*3/uL (ref 0.0–0.7)
Eosinophils Relative: 2 % (ref 0–5)
Monocytes Absolute: 0.4 10*3/uL (ref 0.1–1.0)

## 2011-09-17 LAB — POCT PREGNANCY, URINE: Preg Test, Ur: NEGATIVE

## 2011-09-17 LAB — GLUCOSE, CAPILLARY: Glucose-Capillary: 99 mg/dL (ref 70–99)

## 2011-09-17 MED ORDER — IBUPROFEN 800 MG PO TABS
800.0000 mg | ORAL_TABLET | Freq: Once | ORAL | Status: AC
  Administered 2011-09-17: 800 mg via ORAL
  Filled 2011-09-17: qty 1

## 2011-09-17 MED ORDER — ONDANSETRON HCL 4 MG PO TABS: 4.0000 mg | ORAL_TABLET | Freq: Four times a day (QID) | ORAL | Status: AC

## 2011-09-17 MED ORDER — SODIUM CHLORIDE 0.9 % IV BOLUS (SEPSIS)
1000.0000 mL | Freq: Once | INTRAVENOUS | Status: AC
  Administered 2011-09-17: 1000 mL via INTRAVENOUS

## 2011-09-17 MED ORDER — ONDANSETRON HCL 4 MG/2ML IJ SOLN
4.0000 mg | Freq: Once | INTRAMUSCULAR | Status: AC
  Administered 2011-09-17: 4 mg via INTRAVENOUS
  Filled 2011-09-17: qty 2

## 2011-09-17 NOTE — ED Notes (Signed)
Drank 8 ounces of gingerale and has retained.  C/o shortness of breath - states this has been going on for 10 minutes.

## 2011-09-17 NOTE — Discharge Instructions (Signed)
Nausea and Vomiting  Nausea is a sick feeling that often comes before throwing up (vomiting). Vomiting is a reflex where stomach contents come out of your mouth. Vomiting can cause severe loss of body fluids (dehydration). Children and elderly adults can become dehydrated quickly, especially if they also have diarrhea. Nausea and vomiting are symptoms of a condition or disease. It is important to find the cause of your symptoms.  CAUSES    Direct irritation of the stomach lining. This irritation can result from increased acid production (gastroesophageal reflux disease), infection, food poisoning, taking certain medicines (such as nonsteroidal anti-inflammatory drugs), alcohol use, or tobacco use.   Signals from the brain.These signals could be caused by a headache, heat exposure, an inner ear disturbance, increased pressure in the brain from injury, infection, a tumor, or a concussion, pain, emotional stimulus, or metabolic problems.   An obstruction in the gastrointestinal tract (bowel obstruction).   Illnesses such as diabetes, hepatitis, gallbladder problems, appendicitis, kidney problems, cancer, sepsis, atypical symptoms of a heart attack, or eating disorders.   Medical treatments such as chemotherapy and radiation.   Receiving medicine that makes you sleep (general anesthetic) during surgery.  DIAGNOSIS  Your caregiver may ask for tests to be done if the problems do not improve after a few days. Tests may also be done if symptoms are severe or if the reason for the nausea and vomiting is not clear. Tests may include:   Urine tests.   Blood tests.   Stool tests.   Cultures (to look for evidence of infection).   X-rays or other imaging studies.  Test results can help your caregiver make decisions about treatment or the need for additional tests.  TREATMENT  You need to stay well hydrated. Drink frequently but in small amounts.You may wish to drink water, sports drinks, clear broth, or eat frozen  ice pops or gelatin dessert to help stay hydrated.When you eat, eating slowly may help prevent nausea.There are also some antinausea medicines that may help prevent nausea.  HOME CARE INSTRUCTIONS    Take all medicine as directed by your caregiver.   If you do not have an appetite, do not force yourself to eat. However, you must continue to drink fluids.   If you have an appetite, eat a normal diet unless your caregiver tells you differently.   Eat a variety of complex carbohydrates (rice, wheat, potatoes, bread), lean meats, yogurt, fruits, and vegetables.   Avoid high-fat foods because they are more difficult to digest.   Drink enough water and fluids to keep your urine clear or pale yellow.   If you are dehydrated, ask your caregiver for specific rehydration instructions. Signs of dehydration may include:   Severe thirst.   Dry lips and mouth.   Dizziness.   Dark urine.   Decreasing urine frequency and amount.   Confusion.   Rapid breathing or pulse.  SEEK IMMEDIATE MEDICAL CARE IF:    You have blood or brown flecks (like coffee grounds) in your vomit.   You have black or bloody stools.   You have a severe headache or stiff neck.   You are confused.   You have severe abdominal pain.   You have chest pain or trouble breathing.   You do not urinate at least once every 8 hours.   You develop cold or clammy skin.   You continue to vomit for longer than 24 to 48 hours.   You have a fever.  MAKE SURE YOU:      Understand these instructions.   Will watch your condition.   Will get help right away if you are not doing well or get worse.  Document Released: 04/02/2005 Document Revised: 03/22/2011 Document Reviewed: 08/30/2010  ExitCare Patient Information 2012 [employer].

## 2011-09-17 NOTE — ED Provider Notes (Signed)
History   This chart was scribed for Glynn Octave, MD by Shari Heritage. The patient was seen in room APA09/APA09. Patient's care was started at 1523.     CSN: [id number]  Arrival date & time 09/17/11  1523   First MD Initiated Contact with Patient 09/17/11 1736      Chief Complaint  Patient presents with  . Emesis    (Consider location/radiation/quality/duration/timing/severity/associated sxs/prior treatment) HPI [name] is a 46 y.o. female who presents to the Emergency Department complaining sudden onset, constant fatigue starting yesterday evening with associated fever, diarrhea (x10), vomiting (x4). Patient says that she feels cold. Patient's last episode of emesis was 30 minutes before arrival in ED. Patient is also positive for melena. Patient has a history of anemia and low potassium levels. Patient currently takes potassium pills. Patient also takes hydrochlorothiazide. Patient denies dysuria and hematuria. Patient with h/o of diabetes, hypertension and hypercholesteremia. Patient is a current everyday smoker.   PCP - [facility]  Past Medical History  Diagnosis Date  . Arteriosclerotic cardiovascular disease (ASCVD)     Minimal at cath in [facility].stress nuclear study in 8/08 with nl EF; neg stress echo in 2010  . Diabetes mellitus, type 2 2000    Onset in 2000; no insulin  . Hyperlipidemia   . Hypertension `    during treatment with Geodon  . Gastroesophageal reflux disease     Schatzki's ring  . Anemia, iron deficiency   . Alcohol abuse   . Depression   . Community acquired pneumonia 01/03/10    2011; with pleural effusion-[facility] acute resp failure  . Obesity   . Community acquired pneumonia 05/2010  . Schizoaffective disorder     requiring multiple psychiatric admissions  . Dysphagia     Past Surgical History  Procedure Date  . Dilation and curettage, diagnostic / therapeutic 1992  .  Esophagogastroduodenoscopy 09/16/08    Dr. Ronni Rumble hiatal hernia/excoriations involving the cardia and mucosa consistent with trauma, antral erosions  of linear petechiae ? gastritis versus early gastric antral vascular  ectasia.Marland Kitchen biopsy showed reactive gastropathy. No H. pylori.  . Esophagogastroduodenoscopy 09/2007    Dr. Rinaldo Ratel ring, dilated to 56 French Maloney dilator, small hiatal hernia, antral erosions, biopsies reactive gastropathy.  Gaspar Bidding dilation 07/17/2011    Fields-distal esophageal stricture s/p dilation, chronic gastritis    Family History  Problem Relation Age of Onset  . Colon cancer Other   . Hypertension Mother   . Stroke Father     deceased at age 4  . Heart disease Sister   . Anesthesia problems Neg Hx   . Hypotension Neg Hx   . Malignant hyperthermia Neg Hx   . Pseudochol deficiency Neg Hx     History  Substance Use Topics  . Smoking status: Current Everyday Smoker -- 1.5 packs/day for 30 years    Types: Cigarettes  . Smokeless tobacco: Not on file   Comment: Sometimes she smokes more; up to 2 packs/day.  . Alcohol Use: No     occasional; intake has decreased since she found out she has ulcer    OB History    Grav Para Term Preterm Abortions TAB SAB Ect Mult Living                  Review of Systems A complete 10 system review of systems was obtained and all systems are negative except as noted in the HPI and PMH.  Allergies  Orange; Shrimp; Penicillins; Sulfonamide derivatives; Glipizide; Metronidazole; and Sulfamethoxazole w-trimethoprim  Home Medications   Current Outpatient Rx  Name Route Sig Dispense Refill  . CETIRIZINE HCL 10 MG PO TABS Oral Take 1 tablet (10 mg total) by mouth daily. 30 tablet 3  . FERROUS GLUCONATE 240 (27 FE) MG PO TABS Oral Take 240 mg by mouth 2 (two) times daily.    Marland Kitchen HYDROCHLOROTHIAZIDE 25 MG PO TABS Oral Take 1 tablet (25 mg total) by mouth daily. 31 tablet 2  . INSULIN GLARGINE 100 UNIT/ML [place] SOLN  Subcutaneous Inject 25 Units into the skin at bedtime. 10 mL 11  . LORAZEPAM 0.5 MG PO TABS Oral Take 1-2 tablets (0.5-1 mg total) by mouth 2 (two) times daily as needed. For anxiety 90 tablet 0  . LOVASTATIN 20 MG PO TABS Oral Take 1 tablet (20 mg total) by mouth at bedtime. 31 tablet 3  . METFORMIN HCL 1000 MG PO TABS Oral Take 1 tablet (1,000 mg total) by mouth 2 (two) times daily with a meal. 62 tablet 3  . METOPROLOL TARTRATE 50 MG PO TABS Oral Take 1 tablet (50 mg total) by mouth 2 (two) times daily. 62 tablet 11  . PANTOPRAZOLE SODIUM 40 MG PO TBEC  1 PO 30 MINUTES PRIOR TO MEALS BID 62 tablet 11  . POTASSIUM CHLORIDE CRYS ER 20 MEQ PO TBCR Oral Take 1 tablet (20 mEq total) by mouth 2 (two) times daily. 60 tablet 4  . ZIPRASIDONE HCL 40 MG PO CAPS Oral Take 40 mg by mouth every morning.     Marland Kitchen ZIPRASIDONE HCL 60 MG PO CAPS Oral Take 60 mg by mouth at bedtime.      Marland Kitchen DIPHENHYDRAMINE HCL 25 MG PO TABS Oral Take 50 mg by mouth daily as needed. Sleep/allergies    . FLUTICASONE PROPIONATE 50 MCG/ACT NA SUSP Nasal Place 2 sprays into the nose daily. 16 g 2  . ONDANSETRON HCL 4 MG PO TABS Oral Take 1 tablet (4 mg total) by mouth every 6 (six) hours. 12 tablet 0    BP 130/84  Pulse 75  Temp(Src) 99.1 F (37.3 C) (Oral)  Resp 20  Ht 5\' 4"  (1.626 m)  Wt 188 lb (85.276 kg)  BMI 32.27 kg/m2  SpO2 99%  LMP 08/21/2011  Physical Exam  Nursing note and vitals reviewed. Constitutional: She is oriented to person, place, and time. She appears well-developed and well-nourished.  HENT:  Head: Normocephalic and atraumatic.       Dry mucous membranes.  Eyes: Conjunctivae and EOM are normal. Pupils are equal, round, and reactive to light.  Neck: Normal range of motion. Neck supple.  Cardiovascular: Normal rate and regular rhythm.   Pulmonary/Chest: Effort normal and breath sounds normal.  Abdominal: Soft. Bowel sounds are normal. She exhibits no distension. There is no tenderness.  Genitourinary:  Guaiac negative stool.       No hemorrhoids. No fissures.  Musculoskeletal: Normal range of motion.  Neurological: She is alert and oriented to person, place, and time.  Skin: Skin is warm and dry.  Psychiatric: She has a normal mood and affect.    ED Course  Procedures (including critical care time) DIAGNOSTIC STUDIES: Oxygen Saturation is 99% on room air, normal by my interpretation.    COORDINATION OF CARE: 5:45PM - Patient informed of current plan for treatment and evaluation and agrees with plan at this time.     Labs Reviewed  COMPREHENSIVE METABOLIC PANEL - Abnormal;  Notable for the following:    Sodium 134 (*)    Glucose, Bld 129 (*)    Total Bilirubin 0.2 (*)    All other components within normal limits  CBC  DIFFERENTIAL  LIPASE, BLOOD  URINALYSIS, ROUTINE W REFLEX MICROSCOPIC  GLUCOSE, CAPILLARY  POCT PREGNANCY, URINE   No results found.   1. Nausea and vomiting       MDM  Nausea, diarrhea, vomiting x 1 day.  Chills without fever.  C/o black stools but on iron.  Abdomen soft and nontender.  Vitals stable, IVF, symptom control. FOBT negative.  Symptoms improved and tolerating PO.     I personally performed the services described in this documentation, which was scribed in my presence.  The recorded information has been reviewed and considered.   Glynn Octave, MD 09/18/11 1304

## 2011-09-17 NOTE — ED Notes (Signed)
Pt crying in lobby, Pt advocate talking with her, Pt says her daughter has been "picking at me.  I'm just stressed out".

## 2011-09-17 NOTE — Telephone Encounter (Signed)
LMOM for pt to call. Need to know what type Rx insurance she has for her meds.

## 2011-09-17 NOTE — Telephone Encounter (Signed)
LMOM to call.

## 2011-09-17 NOTE — ED Notes (Signed)
Feels tired, vomiting x4 diarrhea x10,  Weak , "feels cold".

## 2011-09-18 NOTE — Telephone Encounter (Signed)
Pt has to try Nexium or Dexilant before she is eligible for PA on the protonix. ( she has medicare and medicaid)

## 2011-09-18 NOTE — Telephone Encounter (Signed)
Called Rx to April at [facility]. Pt is aware.

## 2011-09-18 NOTE — Telephone Encounter (Signed)
EXPLAIN TO PT SHE NEEDS TO TRY NEXIUM OR DEXILANT BEFORE HER INSURANCE WILL APPROVE PROTONIX. CALL IN DEXILANT 60 MG EVERY MORNING #31, [id number].

## 2011-09-21 ENCOUNTER — Ambulatory Visit: Payer: Medicare Other | Admitting: Internal Medicine

## 2011-09-24 ENCOUNTER — Telehealth: Payer: Self-pay | Admitting: Gastroenterology

## 2011-09-24 NOTE — Telephone Encounter (Signed)
EXPLAIN TO PT SHE NEEDS TO TRY NEXIUM. CALL IN NEXIUM 40 MG PO 30 MINUTES PRIOR TO MEALS, #[phone] [id number].

## 2011-09-24 NOTE — Telephone Encounter (Signed)
Called Rx to [place] at [facility]. Pt is aware.

## 2011-09-24 NOTE — Telephone Encounter (Signed)
The new medication shes taking Dexilant is making her sick, shes SOB and its not controlling the acid reflux, [name] says the medication is not agreeing with her, please advise?

## 2011-09-24 NOTE — Telephone Encounter (Signed)
SEE PRIOR TC. 

## 2011-09-26 DIAGNOSIS — F411 Generalized anxiety disorder: Secondary | ICD-10-CM | POA: Diagnosis not present

## 2011-09-26 DIAGNOSIS — F319 Bipolar disorder, unspecified: Secondary | ICD-10-CM | POA: Diagnosis not present

## 2011-09-27 ENCOUNTER — Telehealth: Payer: Self-pay | Admitting: Gastroenterology

## 2011-09-27 NOTE — Telephone Encounter (Signed)
Routing to RMR, KJ who last saw at [facility].

## 2011-09-27 NOTE — Telephone Encounter (Signed)
Patient needs next available office visit to discuss further. If she is having continued shortness of breath, she needs to go directly to the emergency room. THANKS

## 2011-09-27 NOTE — Telephone Encounter (Signed)
Pt returned call. Was informed she can discuss with Dr. Jena Gauss tomorrow at [facility].

## 2011-09-27 NOTE — Telephone Encounter (Signed)
Pt already has appt with RMR on 09/28/2011 @ 8:30 AM.

## 2011-09-27 NOTE — Telephone Encounter (Signed)
Called pt and she said she took Nexium x 2 on 09/25/2011 and once yesterday. She said yesterday she got very sick on her stomach and had SOB also. Please advise!

## 2011-09-27 NOTE — Telephone Encounter (Signed)
PT IS RMR PT. PLEASE ADDRESS WITH HIM.

## 2011-09-27 NOTE — Telephone Encounter (Signed)
Returned call. LMOM to call.  

## 2011-09-27 NOTE — Telephone Encounter (Signed)
Pt wants to speak with nurse. You can call her at [phone] regarding her medicine making her sick

## 2011-09-27 NOTE — Telephone Encounter (Signed)
LMOM to call.

## 2011-09-28 ENCOUNTER — Ambulatory Visit (INDEPENDENT_AMBULATORY_CARE_PROVIDER_SITE_OTHER): Payer: Medicare Other | Admitting: Internal Medicine

## 2011-09-28 ENCOUNTER — Encounter: Payer: Self-pay | Admitting: Internal Medicine

## 2011-09-28 ENCOUNTER — Other Ambulatory Visit: Payer: Self-pay | Admitting: Internal Medicine

## 2011-09-28 VITALS — BP 120/81 | HR 75 | Temp 97.2°F | Ht 64.0 in | Wt 190.2 lb

## 2011-09-28 DIAGNOSIS — K219 Gastro-esophageal reflux disease without esophagitis: Secondary | ICD-10-CM | POA: Diagnosis not present

## 2011-09-28 DIAGNOSIS — K921 Melena: Secondary | ICD-10-CM | POA: Diagnosis not present

## 2011-09-28 DIAGNOSIS — Z862 Personal history of diseases of the blood and blood-forming organs and certain disorders involving the immune mechanism: Secondary | ICD-10-CM

## 2011-09-28 DIAGNOSIS — K259 Gastric ulcer, unspecified as acute or chronic, without hemorrhage or perforation: Secondary | ICD-10-CM

## 2011-09-28 MED ORDER — PEG 3350-KCL-NA BICARB-NACL 420 G PO SOLR: ORAL | Status: AC

## 2011-09-28 NOTE — Progress Notes (Signed)
Primary Care Physician:  Vernice Jefferson, MD Primary Gastroenterologist:  Dr. Jena Gauss  Pre-Procedure History & Physical: HPI:  [name] is a 46 y.o. female here for followup of GERD and dysphagia. Recent EGD by Dr. Darrick Penna demonstrated a distal esophageal stricture which was dilated with savory dilator. Patient states dysphagia has resolved. Gastric biopsies demonstrated mild inflammation. No evidence of neoplasm or infection. She was having abdominal pain and diarrhea - those symptoms, too,  has settled down. She's noted intermittent rectal bleeding chronically. She has not yet returned stool Hemoccult cards. More recent lab values demonstrated a normal hemoglobin. Blood sugar up a little of 129 pregnancy test negative. HIV testing previously negative. She explained to me today how Dexilant made her sick and Nexium also was poorly tolerated. She notes that protonix did the best of any medication but she was unable to afford it at the time she went to the pharmacy. No family history of colon cancer colorectal neoplasia. No prior colonoscopy. She is trying to stop smoking. She's cut down from 2 packs to one pack a day. She has a nicotine patch.  Past Medical History  Diagnosis Date  . Arteriosclerotic cardiovascular disease (ASCVD)     Minimal at cath in [facility].stress nuclear study in 8/08 with nl EF; neg stress echo in 2010  . Diabetes mellitus, type 2 2000    Onset in 2000; no insulin  . Hyperlipidemia   . Hypertension `    during treatment with Geodon  . Gastroesophageal reflux disease     Schatzki's ring  . Anemia, iron deficiency   . Alcohol abuse   . Depression   . Community acquired pneumonia 01/03/10    2011; with pleural effusion-[facility] resp failure  . Obesity   . Community acquired pneumonia 05/2010  . Schizoaffective disorder     requiring multiple psychiatric admissions  . Dysphagia     Past Surgical History  Procedure Date  . Dilation and  curettage, diagnostic / therapeutic 1992  . Esophagogastroduodenoscopy 09/16/08    Dr. Ronni Rumble hiatal hernia/excoriations involving the cardia and mucosa consistent with trauma, antral erosions  of linear petechiae ? gastritis versus early gastric antral vascular  ectasia.Marland Kitchen biopsy showed reactive gastropathy. No H. pylori.  . Esophagogastroduodenoscopy 09/2007    Dr. Rinaldo Ratel ring, dilated to 56 French Maloney dilator, small hiatal hernia, antral erosions, biopsies reactive gastropathy.  Gaspar Bidding dilation 07/17/2011    Fields-distal esophageal stricture s/p dilation, chronic gastritis    Prior to Admission medications   Medication Sig Start Date End Date Taking? Authorizing Provider  cetirizine (ZYRTEC) 10 MG tablet Take 1 tablet (10 mg total) by mouth daily. 06/02/11 06/01/12 Yes Alanson Puls, MD  diphenhydrAMINE (BENADRYL) 25 MG tablet Take 50 mg by mouth daily as needed. Sleep/allergies   Yes Historical Provider, MD  ferrous gluconate (FERGON) 240 (27 FE) MG tablet Take 240 mg by mouth 2 (two) times daily.   Yes Historical Provider, MD  fluticasone (FLONASE) 50 MCG/ACT nasal spray Place 2 sprays into the nose daily. 08/16/11 08/15/12 Yes Alanson Puls, MD  hydrochlorothiazide (HYDRODIURIL) 25 MG tablet Take 1 tablet (25 mg total) by mouth daily. 08/13/11  Yes Alanson Puls, MD  insulin glargine (LANTUS) 100 UNIT/ML injection Inject 25 Units into the skin at bedtime. 04/04/11 04/03/12 Yes Darnelle Maffucci, MD  LORazepam (ATIVAN) 0.5 MG tablet Take 1-2 tablets (0.5-1 mg total) by mouth 2 (two) times daily as needed. For anxiety 07/06/11  Yes Belenda Cruise  Gavin Potters, MD  lovastatin (MEVACOR) 20 MG tablet Take 1 tablet (20 mg total) by mouth at bedtime. 08/06/11  Yes Alanson Puls, MD  metFORMIN (GLUCOPHAGE) 1000 MG tablet Take 1 tablet (1,000 mg total) by mouth 2 (two) times daily with a meal. 08/06/11  Yes Alanson Puls, MD  metoprolol (LOPRESSOR) 50 MG tablet Take 1 tablet (50 mg total) by  mouth 2 (two) times daily. 05/09/11  Yes Burns Spain, MD  pantoprazole (PROTONIX) 40 MG tablet 1 PO 30 MINUTES PRIOR TO MEALS BID 07/17/11 07/16/12 Yes West Bali, MD  potassium chloride SA (K-DUR,KLOR-CON) 20 MEQ tablet Take 1 tablet (20 mEq total) by mouth 2 (two) times daily. 08/16/11  Yes Alanson Puls, MD  ziprasidone (GEODON) 40 MG capsule Take 40 mg by mouth every morning.    Yes Historical Provider, MD  ziprasidone (GEODON) 60 MG capsule Take 60 mg by mouth at bedtime.     Yes Historical Provider, MD    Allergies as of 09/28/2011 - Review Complete 09/28/2011  Allergen Reaction Noted  . Orange Shortness Of Breath and Itching 03/27/2011  . Shrimp (shellfish allergy) Shortness Of Breath and Itching 03/27/2011  . Penicillins Rash   . Sulfonamide derivatives Rash   . Glipizide Other (See Comments) 01/15/2011  . Metronidazole Swelling   . Sulfamethoxazole w-trimethoprim Rash 02/27/2010    Family History  Problem Relation Age of Onset  . Colon cancer Other   . Hypertension Mother   . Stroke Father     deceased at age 38  . Heart disease Sister   . Anesthesia problems Neg Hx   . Hypotension Neg Hx   . Malignant hyperthermia Neg Hx   . Pseudochol deficiency Neg Hx     History   Social History  . Marital Status: Single    Spouse Name: N/A    Number of Children: N/A  . Years of Education: 12   Occupational History  . UNEMPLOYED   . UNEMPLOYED    Social History Main Topics  . Smoking status: Current Everyday Smoker -- 1.5 packs/day for 30 years    Types: Cigarettes  . Smokeless tobacco: Not on file   Comment: Sometimes she smokes more; up to 2 packs/day.  . Alcohol Use: No     occasional; intake has decreased since she found out she has ulcer  . Drug Use: No  . Sexually Active: No   Other Topics Concern  . 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 Eyes:  Sclera clear, no icterus.   Conjunctiva pink. Ears:  Normal auditory acuity. Nose:  No deformity, discharge,  or lesions. Mouth:  No deformity or lesions. Neck:  Supple; no masses or thyromegaly. No significant cervical adenopathy. Lungs:  Clear throughout to auscultation.   No wheezes, crackles, or rhonchi. No acute distress. Heart:  Regular rate and rhythm;  no murmurs, clicks, rubs,  or gallops. Abdomen: Non-distended, normal bowel sounds.  Soft and nontender without appreciable mass or hepatosplenomegaly.  Pulses:  Normal pulses noted. Extremities:  Without clubbing or edema.  Impression/Plan:   GERD symptoms continue to be a problem she is not on protonix. which helped her the most of PPI agent she has tried. Dysphagia has resolved since having her peptic stricture dilated. I'm concerned about her report of intermittent low volume rectal bleeding. She's not returned the stool for Hemoccult testing. She has a history of iron deficiency anemia but she's been taking iron on a daily basis which has corrected the anemia without any definitive GI pathology being found. Feel it would be wisest to go ahead and offer this lady a diagnostic colonoscopy at this time-she is almost at the screening threshold age for African Americans anyway.  The risks, benefits, limitations, alternatives and imponderables of colonoscopy have been reviewed with the patient. Questions have been answered. All parties are agreeable.   I have  given her prescription for proton X. 40 mg once daily today. Antireflux diet/literature provided. Continued efforts at complete smoking cessation have also been emphasized.  Further recommendations to follow.

## 2011-09-28 NOTE — Patient Instructions (Addendum)
dignostic colonoscopy for hx of internittant rectal bleed and hx of IDA  Rsume protonix 40 mg once daily   GERD information  Smoking cessation information

## 2011-10-01 ENCOUNTER — Other Ambulatory Visit: Payer: Self-pay | Admitting: Urgent Care

## 2011-10-01 DIAGNOSIS — R109 Unspecified abdominal pain: Secondary | ICD-10-CM | POA: Diagnosis not present

## 2011-10-01 DIAGNOSIS — R197 Diarrhea, unspecified: Secondary | ICD-10-CM | POA: Diagnosis not present

## 2011-10-02 ENCOUNTER — Telehealth: Payer: Self-pay | Admitting: Licensed Clinical Social Worker

## 2011-10-02 LAB — GIARDIA/CRYPTOSPORIDIUM (EIA): Giardia Screen (EIA): NEGATIVE

## 2011-10-02 LAB — FECAL LACTOFERRIN, QUANT: Lactoferrin: POSITIVE

## 2011-10-02 NOTE — Telephone Encounter (Signed)
CSW placed call to pt to follow up on pt's goal for smoking cessation.  Pt states she's busy at this time and requesting to return call to CSW at a later time.  CSW will await for return call.

## 2011-10-03 ENCOUNTER — Other Ambulatory Visit: Payer: Self-pay | Admitting: *Deleted

## 2011-10-03 NOTE — Telephone Encounter (Signed)
Refill on nicotine 21mg /24 hour patch  Last filled 05/17/11

## 2011-10-04 ENCOUNTER — Telehealth: Payer: Self-pay | Admitting: Internal Medicine

## 2011-10-04 ENCOUNTER — Emergency Department (HOSPITAL_COMMUNITY)
Admission: EM | Admit: 2011-10-04 | Discharge: 2011-10-04 | Disposition: A | Discharge status: Home / Self Care | Payer: Medicare Other | Attending: Emergency Medicine | Admitting: Emergency Medicine

## 2011-10-04 ENCOUNTER — Encounter: Payer: Self-pay | Admitting: Internal Medicine

## 2011-10-04 ENCOUNTER — Telehealth: Payer: Self-pay | Admitting: *Deleted

## 2011-10-04 ENCOUNTER — Encounter (HOSPITAL_COMMUNITY): Payer: Self-pay

## 2011-10-04 ENCOUNTER — Ambulatory Visit (INDEPENDENT_AMBULATORY_CARE_PROVIDER_SITE_OTHER): Payer: Medicare Other | Admitting: Internal Medicine

## 2011-10-04 VITALS — BP 122/84 | HR 102 | Temp 97.5°F | Ht 64.0 in | Wt 195.8 lb

## 2011-10-04 DIAGNOSIS — E785 Hyperlipidemia, unspecified: Secondary | ICD-10-CM | POA: Insufficient documentation

## 2011-10-04 DIAGNOSIS — W19XXXA Unspecified fall, initial encounter: Secondary | ICD-10-CM

## 2011-10-04 DIAGNOSIS — S90129A Contusion of unspecified lesser toe(s) without damage to nail, initial encounter: Secondary | ICD-10-CM | POA: Insufficient documentation

## 2011-10-04 DIAGNOSIS — Y92009 Unspecified place in unspecified non-institutional (private) residence as the place of occurrence of the external cause: Secondary | ICD-10-CM | POA: Insufficient documentation

## 2011-10-04 DIAGNOSIS — I10 Essential (primary) hypertension: Secondary | ICD-10-CM | POA: Diagnosis not present

## 2011-10-04 DIAGNOSIS — E119 Type 2 diabetes mellitus without complications: Secondary | ICD-10-CM | POA: Insufficient documentation

## 2011-10-04 DIAGNOSIS — M79609 Pain in unspecified limb: Secondary | ICD-10-CM | POA: Insufficient documentation

## 2011-10-04 DIAGNOSIS — IMO0002 Reserved for concepts with insufficient information to code with codable children: Secondary | ICD-10-CM | POA: Insufficient documentation

## 2011-10-04 DIAGNOSIS — W108XXA Fall (on) (from) other stairs and steps, initial encounter: Secondary | ICD-10-CM | POA: Insufficient documentation

## 2011-10-04 MED ORDER — TETANUS-DIPHTH-ACELL PERTUSSIS 5-2.5-18.5 LF-MCG/0.5 IM SUSP
0.5000 mL | Freq: Once | INTRAMUSCULAR | Status: AC
  Administered 2011-10-04: 0.5 mL via INTRAMUSCULAR
  Filled 2011-10-04: qty 0.5

## 2011-10-04 MED ORDER — OXYCODONE-ACETAMINOPHEN 5-325 MG PO TABS: 1.0000 | ORAL_TABLET | ORAL | Status: AC | PRN

## 2011-10-04 NOTE — Telephone Encounter (Signed)
Pt called to see if her results were back. Please call her at [phone]

## 2011-10-04 NOTE — Assessment & Plan Note (Signed)
Well controlled 

## 2011-10-04 NOTE — ED Notes (Signed)
Pt fell yesterday and thinks she may have broken right foot great toe.

## 2011-10-04 NOTE — ED Provider Notes (Signed)
History   This chart was scribed for Joya Gaskins, MD by Clarita Crane. The patient was seen in room APA19/APA19. Patient's care was started at 0700.    CSN: [id number]  Arrival date & time 10/04/11  0700   First MD Initiated Contact with Patient 10/04/11 0710      Chief Complaint  Patient presents with  . Fall     HPI [name] is a 46 y.o. female who presents to the Emergency Department complaining of constant mild to moderate pain to right toe 5th toe with associated abrasion to lateral aspect of right lower leg onset yesterday after sustaining a fall down a set of stairs. Patient notes pain is aggravated with weight bearing and palpation. She did not hit her head  Denies LOC, head injury, neck pain, back pain, HA, chest pain, abdominal pain. Patient reports having an appointment with PCP later today. Patient with h/o HTN, HLD, diabetes, GERD, iron deficient anemia.  Past Medical History  Diagnosis Date  . Arteriosclerotic cardiovascular disease (ASCVD)     Minimal at cath in [facility].stress nuclear study in 8/08 with nl EF; neg stress echo in 2010  . Diabetes mellitus, type 2 2000    Onset in 2000; no insulin  . Hyperlipidemia   . Hypertension `    during treatment with Geodon  . Gastroesophageal reflux disease     Schatzki's ring  . Anemia, iron deficiency   . Alcohol abuse   . Depression   . Community acquired pneumonia 01/03/10    2011; with pleural effusion-[facility] acute resp failure  . Obesity   . Community acquired pneumonia 05/2010  . Schizoaffective disorder     requiring multiple psychiatric admissions  . Dysphagia     Past Surgical History  Procedure Date  . Dilation and curettage, diagnostic / therapeutic 1992  . Esophagogastroduodenoscopy 09/16/08    Dr. Ronni Rumble hiatal hernia/excoriations involving the cardia and mucosa consistent with trauma, antral erosions  of linear petechiae ? gastritis versus early gastric antral  vascular  ectasia.Marland Kitchen biopsy showed reactive gastropathy. No H. pylori.  . Esophagogastroduodenoscopy 09/2007    Dr. Rinaldo Ratel ring, dilated to 56 French Maloney dilator, small hiatal hernia, antral erosions, biopsies reactive gastropathy.  Gaspar Bidding dilation 07/17/2011    Fields-distal esophageal stricture s/p dilation, chronic gastritis    Family History  Problem Relation Age of Onset  . Colon cancer Other   . Hypertension Mother   . Stroke Father     deceased at age 94  . Heart disease Sister   . Anesthesia problems Neg Hx   . Hypotension Neg Hx   . Malignant hyperthermia Neg Hx   . Pseudochol deficiency Neg Hx     History  Substance Use Topics  . Smoking status: Current Everyday Smoker -- 1.5 packs/day for 30 years    Types: Cigarettes  . Smokeless tobacco: Not on file   Comment: Sometimes she smokes more; up to 2 packs/day.  . Alcohol Use: 0.0 oz/week     occasional; intake has decreased since she found out she has ulcer    OB History    Grav Para Term Preterm Abortions TAB SAB Ect Mult Living                  Review of Systems  Musculoskeletal: Negative for back pain.  Skin: Positive for wound.     Allergies  Orange; Shrimp; Penicillins; Sulfonamide derivatives; Glipizide; Metronidazole; and Sulfamethoxazole w-trimethoprim  Home Medications  Current Outpatient Rx  Name Route Sig Dispense Refill  . CETIRIZINE HCL 10 MG PO TABS Oral Take 1 tablet (10 mg total) by mouth daily. 30 tablet 3  . DIPHENHYDRAMINE HCL 25 MG PO TABS Oral Take 50 mg by mouth daily as needed. Sleep/allergies    . FERROUS GLUCONATE 240 (27 FE) MG PO TABS Oral Take 240 mg by mouth 2 (two) times daily.    Marland Kitchen FLUTICASONE PROPIONATE 50 MCG/ACT NA SUSP Nasal Place 2 sprays into the nose daily. 16 g 2  . HYDROCHLOROTHIAZIDE 25 MG PO TABS Oral Take 1 tablet (25 mg total) by mouth daily. 31 tablet 2  . INSULIN GLARGINE 100 UNIT/ML [place] SOLN Subcutaneous Inject 25 Units into the skin at bedtime.  10 mL 11  . LORAZEPAM 0.5 MG PO TABS Oral Take 1-2 tablets (0.5-1 mg total) by mouth 2 (two) times daily as needed. For anxiety 90 tablet 0  . LOVASTATIN 20 MG PO TABS Oral Take 1 tablet (20 mg total) by mouth at bedtime. 31 tablet 3  . METFORMIN HCL 1000 MG PO TABS Oral Take 1 tablet (1,000 mg total) by mouth 2 (two) times daily with a meal. 62 tablet 3  . METOPROLOL TARTRATE 50 MG PO TABS Oral Take 1 tablet (50 mg total) by mouth 2 (two) times daily. 62 tablet 11  . PANTOPRAZOLE SODIUM 40 MG PO TBEC  1 PO 30 MINUTES PRIOR TO MEALS BID 62 tablet 11  . POTASSIUM CHLORIDE CRYS ER 20 MEQ PO TBCR Oral Take 1 tablet (20 mEq total) by mouth 2 (two) times daily. 60 tablet 4  . ZIPRASIDONE HCL 40 MG PO CAPS Oral Take 40 mg by mouth every morning.     Marland Kitchen ZIPRASIDONE HCL 60 MG PO CAPS Oral Take 60 mg by mouth at bedtime.        BP 132/102  Pulse 82  Temp 98 F (36.7 C) (Oral)  Resp 20  Ht 5\' 4"  (1.626 m)  Wt 188 lb (85.276 kg)  BMI 32.27 kg/m2  SpO2 100%  LMP 09/24/2011  Physical Exam CONSTITUTIONAL: Well developed/well nourished HEAD AND FACE: Normocephalic/atraumatic EYES: EOMI/PERRL ENMT: Mucous membranes moist NECK: supple no meningeal signs, c-spine non-tender SPINE:entire spine nontender, CV: S1/S2 noted, no murmurs/rubs/gallops noted LUNGS: Lungs are clear to auscultation bilaterally, no apparent distress ABDOMEN: soft, nontender, no rebound or guarding NEURO: Pt is awake/alert, moves all extremitiesx4 EXTREMITIES: pulses normal, full ROM, tender to right 5th toe with no deformity or open skin noted.  She can range the toe.  No discoloration noted.  No other foot tenderness.  Right ankle/knee nontender and has full ROM SKIN: warm, color normal, abrasion to right tibial surface PSYCH: no abnormalities of mood noted  ED Course  Procedures  DIAGNOSTIC STUDIES: Oxygen Saturation is 100% on room air, normal by my interpretation.    COORDINATION OF CARE: 7:19AM-Patient informed of  current plan for treatment and evaluation and agrees with plan at this time. I informed patient that we could do xray, however treatment of suspected toe fractures requires only buddy tape and postop shoe and xray would not likely alter management. It does not appear dislocated or open. She decided not to have xray.  Short course of pain meds given, and she is going to see PCP today.  Pt related what appeared to be mechanical fall today while walking downsteps.  The patient appears reasonably screened and/or stabilized for discharge and I doubt any other medical condition or other [facility] requiring  further screening, evaluation, or treatment in the ED at this time prior to discharge.       MDM  Nursing notes including past medical history and social history reviewed and considered in documentation       I personally performed the services described in this documentation, which was scribed in my presence. The recorded information has been reviewed and considered.      Joya Gaskins, MD 10/04/11 [phone]
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
## 2011-10-04 NOTE — Assessment & Plan Note (Signed)
Diabetes is very well controlled at this time with HbA1c 7.3 today.

## 2011-10-04 NOTE — Telephone Encounter (Signed)
Went to ER recently - felt weak and fell outside. Pt concerned about BP - has not taken BP med today. Pt wants to be seen today. Appt given. Stanton Kidney Lyndel Dancel RN 10/04/11 10:30AM

## 2011-10-04 NOTE — Telephone Encounter (Signed)
I tried to call but no answer.

## 2011-10-04 NOTE — ED Notes (Addendum)
Patient states that she fell down some steps yesterday and thinks that she may have broken her right little toe. Abrasion noted to the right tibial area.  States that she is going to see her doctor today because she fell and doesn't know why, states she is supposed to have testing today, states she just wants to know if her toe is broken.
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
## 2011-10-04 NOTE — Assessment & Plan Note (Signed)
Patient's lipid panel results were discussed. She is not due for a lipid profile check at this time.

## 2011-10-04 NOTE — Patient Instructions (Signed)
Home Safety and Preventing Falls Falls are a leading cause of injury and while they affect all age groups, falls have greater short-term and long-term impact on older age groups. However, falls should not be a part of life or aging. It is possible for individuals and their families to use preventive measures to significantly decrease the likelihood that anyone, especially an older adult, will fall. There are many simple measures which can make your home safer with respect to preventing falls. The following actions can help reduce falls among all members of your family and are especially important as you age, when your balance, lower limb strength, coordination, and eyesight may be declining. The use of preventive measures will help to reduce you and your family's risk of falls and serious medical consequences. OUTDOORS  Repair cracks and edges of walkways and driveways.   Remove high doorway thresholds and trim shrubbery on the main path into your home.   Ensure there is good outside lighting at main entrances and along main walkways.   Clear walkways of tools, rocks, debris, and clutter.   Check that handrails are not broken and are securely fastened. Both sides of steps should have handrails.   In the garage, be attentive to and clean up grease or oil spills on the cement. This can make the surface extremely slippery.   In winter, have leaves, snow, and ice cleared regularly.   Use sand or salt on walkways during winter months.  BATHROOM  Install grab bars by the toilet and in the tub and shower.   Use non-skid mats or decals in the tub or shower.   If unable to easily stand unsupported while showering, place a plastic non slip stool in the shower to sit on when needed.   Install night lights.   Keep floors dry and clean up all water on the floor immediately.   Remove soap buildup in tub or shower on a regular basis.   Secure bath mats with non-slip, double-sided rug tape.    Remove tripping hazards from the floors.  BEDROOMS  Install night lights.   Do not use oversized bedding.   Make sure a bedside light is easy to reach.   Keep a telephone by your bedside.   Make sure that you can get in and out of your bed easily.   Have a firm chair, with side arms, to use for getting dressed.   Remove clutter from around closets.   Store clothing, bed coverings, and other household items where you can reach them comfortably.   Remove tripping hazards from the floor.  LIVING AREAS AND STAIRWAYS  Turn on lights to avoid having to walk through dark areas.   Keep lighting uniform in each room. Place brighter lightbulbs in darker areas, including stairways.   Replace lightbulbs that burn out in stairways immediately.   Arrange furniture to provide for clear pathways.   Keep furniture in the same place.   Eliminate or tape down electrical cables in high traffic areas.   Place handrails on both sides of stairways. Use handrails when going up or down stairs.   Most falls occur on the top or bottom 3 steps.   Fix any loose handrails. Make sure handrails on both sides of the stairways are as long as the stairs.   Remove all walkway obstacles.   Coil or tape electrical cords off to the side of walking areas and out of the way. If using many extension cords, have an electrician   put in a new wall outlet to reduce or eliminate them.   Make sure spills are cleaned up quickly and allow time for drying before walking on freshly cleaned floors.   Firmly attach carpet with non-skid or two-sided tape.   Keep frequently used items within easy reach.   Remove tripping hazards such as throw rugs and clutter in walkways. Never leave objects on stairs.   Get rid of throw rugs elsewhere if possible.   Eliminate uneven floor surfaces.   Make sure couches and chairs are easy to get into and out of.   Check carpeting to make sure it is firmly attached along stairs.    Make repairs to worn or loose carpet promptly.   Select a carpet pattern that does not visually hide the edge of steps.   Avoid placing throw rugs or scatter rugs at the top or bottom of stairways, or properly secure with carpet tape to prevent slippage.   Have an electrician put in a light switch at the top and bottom of the stairs.   Get light switches that glow.   Avoid the following practices: hurrying, inattention, obscured vision, carrying large loads, and wearing slip-on shoes.   Be aware of all pets.  KITCHEN  Place items that are used frequently, such as dishes and food, within easy reach.   Keep handles on pots and pans toward the center of the stove. Use back burners when possible.   Make sure spills are cleaned up quickly and allow time for drying.   Avoid walking on wet floors.   Avoid hot utensils and knives.   Position shelves so they are not too high or low.   Place commonly used objects within easy reach.   If necessary, use a sturdy step stool with a grab bar when reaching.   Make sure electrical cables are out of the way.   Do not use floor polish or wax that makes floors slippery.  OTHER HOME FALL PREVENTION STRATEGIES  Wear low heel or rubber sole shoes that are supportive and fit well.   Wear closed toe shoes.   Know and watch for side effects of medications. Have your caregiver or pharmacist look at all your medicines, even over-the-counter medicines. Some medicines can make you sleepy or dizzy.   Exercise regularly. Exercise makes you stronger and improves your balance and coordination.   Limit use of alcohol.   Use eyeglasses if necessary and keep them clean. Have your vision checked every year.   Organize your household in a manner that minimizes the need to walk distances when hurried, or go up and down stairs unnecessarily. For example, have a phone placed on at least each floor of your home. If possible, have a phone beside each sitting  or lying area where you spend the most time at home. Keep emergency numbers posted at all phones.   Use non-skid floor wax.   When using a ladder, make sure:   The base is firm.   All ladder feet are on level ground.   The ladder is angled against the wall properly.   When climbing a ladder, face the ladder and hold the ladder rungs firmly.   If reaching, always keep your hips and body weight centered between the rails.   When using a stepladder, make sure it is fully opened and both spreaders are firmly locked.   Do not climb a closed stepladder.   Avoid climbing beyond the second step from the top   of a stepladder and the 4th rung from the top of an extension ladder.   Learn and use mobility aids as needed.   Change positions slowly. Arise slowly from sitting and lying positions. Sit on the edge of your bed before getting to your feet.   If you have a history of falls, ask someone to add color or contrast paint or tape to grab bars and handrails in your home.   If you have a history of falls, ask someone to place contrasting color strips on first and last steps.   Install an electrical emergency response system if you need one, and know how to use it.   If you have a medical or other condition that causes you to have limited physical strength, it is important that you reach out to family and friends for occasional help.  FOR CHILDREN:  If young children are in the home, use safety gates. At the top of stairs use screw-mounted gates; use pressure-mounted gates for the bottom of the stairs and doorways between rooms.   Young children should be taught to descend stairs on their stomachs, feet first, and later using the handrail.   Keep drawers fully closed to prevent them from being climbed on or pulled out entirely.   Move chairs, cribs, beds and other furniture away from windows.   Consider installing window guards on windows ground floor and up, unless they are emergency  fire exits. Make sure they have easy release mechanisms.   Consider installing special locks that only allow the window to be opened to a certain height.   Never rely on window screens to prevent falls.   Never leave babies alone on changing tables, beds or sofas. Use a changing table that has a restraining strap.   When a child can pull to a standing position, the crib mattress should be adjusted to its lowest position. There should be at least 26 inches between the top rails of the crib drop side and the mattress. Toys, bumper pads, and other objects that can be used as steps to climb out should be removed from the crib.   On bunk beds never allow a child under age 6 to sleep on the top bunk. For older children, if the upper bunk is not against a wall, use guard rails on both sides. No matter how old a child is, keep the guard rails in place on the top bunk since children roll during sleep. Do not permit horseplay on bunks.   Grass and soil surfaces beneath backyard playground equipment should be replaced with hardwood chips, shredded wood mulch, sand, pea gravel, rubber, crushed stone, or another safer material at depths of at least 9 to 12 inches.   When riding bikes or using skates, skateboards, skis, or snowboards, require children to wear helmets. Look for those that have stickers stating that they meet or exceed safety standards.   Vertical posts or pickets in deck, balcony, and stairway railings should be no more than 3 1/2 inches apart if a young baby will have access to the area. The space between horizontal rails or bars, and between the floor and the first horizontal rail or bar, should be no more than 3 1/2 inches.  Document Released: 03/23/2002 Document Revised: 03/22/2011 Document Reviewed: 01/20/2009 [employer] Patient Information 2012 [employer]. 

## 2011-10-04 NOTE — Telephone Encounter (Signed)
Pt called back and I told her that the results was not back yet but we would call her as soon as we got them.

## 2011-10-05 LAB — STOOL CULTURE

## 2011-10-08 ENCOUNTER — Encounter (HOSPITAL_COMMUNITY): Payer: Self-pay | Admitting: Pharmacy Technician

## 2011-10-08 NOTE — Progress Notes (Signed)
Pt called- she would like to cancel her procedure this week because they have had a death in the family. Pt would like to be rescheduled for 2 weeks from now. Pt will need to be rescheduled before July 12th so she will be within 30 days of her ov.   Leighann, please cancel and reschedule appt. Thanks.

## 2011-10-09 NOTE — Progress Notes (Signed)
Patient ID: [name], female   DOB: Apr 25, 1965, 46 y.o.   MRN: [id number]    I called patient and offered her an appt on 7/10/213 with Dr Jena Gauss for her tcs.  Pt didn't answer so I lmom.

## 2011-10-10 ENCOUNTER — Encounter: Payer: Self-pay | Admitting: Internal Medicine

## 2011-10-10 NOTE — Progress Notes (Signed)
I have mailed the patient a letter instructing her to call our office to be rescheduled for her procedure before July 12 or she will need another office visit before her procedure could be scheduled.

## 2011-10-10 NOTE — Progress Notes (Signed)
Patient ID: FEMALE IAFRATE, female   DOB: 07-26-1965, 46 y.o.   MRN: [id number]  Per Soledad Gerlach the patient stated she will call us back when she get scheduled.

## 2011-10-11 ENCOUNTER — Encounter (HOSPITAL_COMMUNITY): Admission: RE | Payer: Self-pay | Source: Ambulatory Visit

## 2011-10-11 ENCOUNTER — Telehealth: Payer: Self-pay | Admitting: Cardiology

## 2011-10-11 ENCOUNTER — Ambulatory Visit (HOSPITAL_COMMUNITY): Admission: RE | Admit: 2011-10-11 | Payer: Medicare Other | Source: Ambulatory Visit | Admitting: Internal Medicine

## 2011-10-11 SURGERY — COLONOSCOPY
Anesthesia: Moderate Sedation

## 2011-10-11 MED ORDER — NICOTINE 21 MG/24HR TD PT24: 1.0000 | MEDICATED_PATCH | TRANSDERMAL | Status: AC

## 2011-10-11 NOTE — Telephone Encounter (Signed)
PT WANTING SEEN TODAY. SHE STATES SHE HAS FLUID GAIN AND FEELS DIZZY.

## 2011-10-11 NOTE — Telephone Encounter (Signed)
Pt states that she has felt dizzy and has edema in lower extremities bilaterally.  Offered an appointment with Dr Daleen Squibb tomorrow, however states she will be going out of town and will not be able to make it.  I advised her that she should seek medical attention in the ED if she continues to progress and feels worse.

## 2011-10-12 NOTE — Progress Notes (Signed)
Results Cc to PCP  

## 2011-10-12 NOTE — Progress Notes (Signed)
Quick Note:  Please let patient know her stool studies were normal. CC: Vernice Jefferson, MD  ______

## 2011-10-15 NOTE — Progress Notes (Signed)
Quick Note:  Mailed letter to pt. cc'd pcp ______

## 2011-10-19 ENCOUNTER — Other Ambulatory Visit: Payer: Self-pay | Admitting: *Deleted

## 2011-10-19 NOTE — Telephone Encounter (Signed)
Patient calls with complaints of intermittent chest pain and wants to be seen today.  Advised her that we have no open appointments and will have to be Monday.  Pt to go to ED if symptoms worsen or persist.  Has not been seen in the office for quite some time due to non-compliance with her appointments.

## 2011-10-22 ENCOUNTER — Ambulatory Visit (INDEPENDENT_AMBULATORY_CARE_PROVIDER_SITE_OTHER): Payer: Medicare Other | Admitting: Cardiology

## 2011-10-22 ENCOUNTER — Encounter: Payer: Self-pay | Admitting: Cardiology

## 2011-10-22 VITALS — BP 124/88 | HR 80 | Ht 64.0 in | Wt 199.4 lb

## 2011-10-22 DIAGNOSIS — F329 Major depressive disorder, single episode, unspecified: Secondary | ICD-10-CM

## 2011-10-22 DIAGNOSIS — I2510 Atherosclerotic heart disease of native coronary artery without angina pectoris: Secondary | ICD-10-CM

## 2011-10-22 DIAGNOSIS — E785 Hyperlipidemia, unspecified: Secondary | ICD-10-CM

## 2011-10-22 DIAGNOSIS — I7090 Unspecified atherosclerosis: Secondary | ICD-10-CM | POA: Diagnosis not present

## 2011-10-22 DIAGNOSIS — I519 Heart disease, unspecified: Secondary | ICD-10-CM | POA: Diagnosis not present

## 2011-10-22 DIAGNOSIS — I10 Essential (primary) hypertension: Secondary | ICD-10-CM

## 2011-10-22 DIAGNOSIS — F17200 Nicotine dependence, unspecified, uncomplicated: Secondary | ICD-10-CM

## 2011-10-22 DIAGNOSIS — E669 Obesity, unspecified: Secondary | ICD-10-CM

## 2011-10-22 NOTE — Assessment & Plan Note (Signed)
Advised to stop smoking.

## 2011-10-22 NOTE — Progress Notes (Signed)
HPI [name] returns today for evaluation and management of her nonobstructive coronary disease, multiple cardiac risk factors, and diastolic dysfunction.  She's had some atypical chest pain once since I last saw her. She denies exertional chest tightness or pressure.  She denies any palpitations, orthopnea, PND. He's had no presyncope or syncope.  Her last hemoglobin A1c was 7%. She is on a statin. She has not lost any weight.  Past Medical History  Diagnosis Date  . Arteriosclerotic cardiovascular disease (ASCVD)     Minimal at cath in [facility].stress nuclear study in 8/08 with nl EF; neg stress echo in 2010  . Diabetes mellitus, type 2 2000    Onset in 2000; no insulin  . Hyperlipidemia   . Hypertension `    during treatment with Geodon  . Gastroesophageal reflux disease     Schatzki's ring  . Anemia, iron deficiency   . Alcohol abuse   . Depression   . Community acquired pneumonia 01/03/10    2011; with pleural effusion-[facility] acute resp failure  . Obesity   . Community acquired pneumonia 05/2010  . Schizoaffective disorder     requiring multiple psychiatric admissions  . Dysphagia     Current Outpatient Prescriptions  Medication Sig Dispense Refill  . cetirizine (ZYRTEC) 10 MG tablet Take 1 tablet (10 mg total) by mouth daily.  30 tablet  3  . diphenhydrAMINE (BENADRYL) 25 MG tablet Take 50 mg by mouth daily as needed. Sleep/allergies      . ferrous gluconate (FERGON) 240 (27 FE) MG tablet Take 240 mg by mouth 2 (two) times daily.      . hydrochlorothiazide (HYDRODIURIL) 25 MG tablet Take 1 tablet (25 mg total) by mouth daily.  31 tablet  2  . insulin glargine (LANTUS) 100 UNIT/ML injection Inject 28 Units into the skin at bedtime.      Marland Kitchen LORazepam (ATIVAN) 0.5 MG tablet Take 1-2 tablets (0.5-1 mg total) by mouth 2 (two) times daily as needed. For anxiety  90 tablet  0  . lovastatin (MEVACOR) 20 MG tablet Take 1 tablet (20 mg total) by mouth at bedtime.   31 tablet  3  . metFORMIN (GLUCOPHAGE) 1000 MG tablet Take 1 tablet (1,000 mg total) by mouth 2 (two) times daily with a meal.  62 tablet  3  . metoprolol (LOPRESSOR) 50 MG tablet Take 1 tablet (50 mg total) by mouth 2 (two) times daily.  62 tablet  11  . mometasone (NASONEX) 50 MCG/ACT nasal spray Place 2 sprays into the nose daily.      . nicotine (NICODERM CQ - DOSED IN MG/24 HOURS) 21 mg/24hr patch Place 1 patch onto the skin daily.  14 patch  1  . pantoprazole (PROTONIX) 40 MG tablet 1 PO 30 MINUTES PRIOR TO MEALS BID  62 tablet  11  . potassium chloride SA (K-DUR,KLOR-CON) 20 MEQ tablet Take 1 tablet (20 mEq total) by mouth 2 (two) times daily.  60 tablet  4  . ziprasidone (GEODON) 40 MG capsule Take 40 mg by mouth every morning.       . ziprasidone (GEODON) 60 MG capsule Take 60 mg by mouth at bedtime.          Allergies  Allergen Reactions  . Metronidazole Shortness Of Breath and Swelling  . Orange Itching  . Shrimp (Shellfish Allergy) Shortness Of Breath and Itching    Takes Benadryl before eating shrimp.  Marland Kitchen Penicillins Hives and Swelling  Fever as well  . Sulfonamide Derivatives Hives    fever  . Glipizide Other (See Comments)    psychosis  . Sulfamethoxazole W-Trimethoprim Rash    Family History  Problem Relation Age of Onset  . Colon cancer Other   . Hypertension Mother   . Stroke Father     deceased at age 54  . Heart disease Sister   . Anesthesia problems Neg Hx   . Hypotension Neg Hx   . Malignant hyperthermia Neg Hx   . Pseudochol deficiency Neg Hx     History   Social History  . Marital Status: Single    Spouse Name: N/A    Number of Children: N/A  . Years of Education: 12   Occupational History  . UNEMPLOYED   . UNEMPLOYED    Social History Main Topics  . Smoking status: Current Everyday Smoker -- 1.5 packs/day for 30 years    Types: Cigarettes  . Smokeless tobacco: Not on file   Comment: Sometimes she smokes more; up to 2 packs/day.  .  Alcohol Use: 0.0 oz/week     occasional; intake has decreased since she found out she has ulcer  . Drug Use: No  . Sexually Active: No   Other Topics Concern  . 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); On disability (depression qualifies); Graduated high school in [place] and some community college in [place]    ROS ALL NEGATIVE EXCEPT THOSE NOTED IN HPI  PE  General Appearance: well developed, well nourished in no acute distress, obese HEENT: symmetrical face, PERRLA, good dentition  Neck: no JVD, thyromegaly, or adenopathy, trachea midline Chest: symmetric without deformity Cardiac: PMI non-displaced, RRR, normal S1, S2, no gallop or murmur Lung: clear to ausculation and percussion Vascular: all pulses full without bruits  Abdominal: nondistended, nontender, good bowel sounds, no HSM, no bruits Extremities: no cyanosis, clubbing or edema, no sign of DVT, no varicosities  Skin: normal color, no rashes Neuro: alert and oriented x 3, non-focal Pysch: normal affect  EKG  BMET    Component Value Date/Time   NA 134* 09/17/2011 1747   K 3.6 09/17/2011 1747   CL 100 09/17/2011 1747   CO2 22 09/17/2011 1747   GLUCOSE 129* 09/17/2011 1747   BUN 10 09/17/2011 1747   CREATININE 0.69 09/17/2011 1747   CREATININE 0.78 08/10/2011 1103   CALCIUM 9.9 09/17/2011 1747   GFRNONAA >90 09/17/2011 1747   GFRAA >90 09/17/2011 1747    Lipid Panel     Component Value Date/Time   CHOL 164 12/27/2010 1039   TRIG 180* 12/27/2010 1039   HDL 43 12/27/2010 1039   CHOLHDL 3.8 12/27/2010 1039   VLDL 36 12/27/2010 1039   LDLCALC 85 12/27/2010 1039    CBC    Component Value Date/Time   WBC 8.2 09/17/2011 1747   RBC 4.52 09/17/2011 1747   HGB 12.6 09/17/2011 1747   HCT 37.8 09/17/2011 1747   PLT 238 09/17/2011 1747   MCV 83.6 09/17/2011 1747   MCH 27.9 09/17/2011 1747   MCHC 33.3 09/17/2011 1747    RDW 15.0 09/17/2011 1747   LYMPHSABS 2.6 09/17/2011 1747   MONOABS 0.4 09/17/2011 1747   EOSABS 0.2 09/17/2011 1747   BASOSABS 0.0 09/17/2011 1747

## 2011-10-22 NOTE — Patient Instructions (Signed)
Your physician recommends that you continue on your current medications as directed. Please refer to the Current Medication list given to you today.  Your physician discussed the hazards of tobacco use. Tobacco use cessation is recommended and techniques and options to help you quit were discussed.   Your physician recommends that you schedule a follow-up appointment in: 1 year with Dr. Daleen Squibb.

## 2011-10-22 NOTE — Assessment & Plan Note (Signed)
Stable. On good secondary preventative therapy. Symptoms of angina reviewed. Return to office in one year or when necessary.

## 2011-10-22 NOTE — Assessment & Plan Note (Signed)
Encouraged to lose weight

## 2011-10-23 ENCOUNTER — Telehealth: Payer: Self-pay

## 2011-10-23 NOTE — Telephone Encounter (Signed)
Pt called wanting to reschedule her TCS for this week. I told her that the schedule was full and if she did not get her TCS this week she would have to come back in for an offfce visit. She stated that RMR wanted her to have this done as soon as she could. She canceled her TCS that was on June 26. Please advise. She can be contact at [phone]

## 2011-10-23 NOTE — Telephone Encounter (Signed)
Please address rescheduling issues with Dr. Jena Gauss.  Regarding her protonix. He started her on once daily. Why is she wanting BID?

## 2011-10-23 NOTE — Telephone Encounter (Signed)
Routing refill to refill box

## 2011-10-23 NOTE — Telephone Encounter (Signed)
Patient is asking for a refill on her protonix she is having to take it twice a day called to [facility] in [place] please advise?

## 2011-10-24 ENCOUNTER — Other Ambulatory Visit: Payer: Self-pay | Admitting: Internal Medicine

## 2011-10-24 NOTE — Telephone Encounter (Signed)
Tried to call pt- NA 

## 2011-10-25 NOTE — Telephone Encounter (Signed)
Tried to call but phone was not working

## 2011-10-25 NOTE — Telephone Encounter (Signed)
PER  RMR: In this case, as soon as possible will be the next available endoscopy slot that I have. I don't think it is going to happen this week. ----- Message -----

## 2011-10-26 MED ORDER — PANTOPRAZOLE SODIUM 40 MG PO TBEC: DELAYED_RELEASE_TABLET | ORAL | Status: DC

## 2011-10-26 NOTE — Addendum Note (Signed)
Addended by: Tiffany Kocher on: 10/26/2011 10:50 AM   Modules accepted: Orders

## 2011-10-26 NOTE — Telephone Encounter (Signed)
Spoke with pt- she stated her original rx was for bid but RMR wrote it for qd trying to help her save some money. Pt stated taking it qd wasn't helping at all and she needed to take it bid.   She is aware she needs another ov and Darl Pikes made appt for her.

## 2011-10-30 NOTE — Progress Notes (Signed)
Faxed to Dr Kapadia, PCP 

## 2011-10-30 NOTE — Progress Notes (Signed)
Quick Note:  ZO:XWRUEAV, NEEMA, MD  ______

## 2011-10-30 NOTE — Progress Notes (Signed)
Quick Note:  Cc:KAPADIA, NEEMA, MD  ______ 

## 2011-10-30 NOTE — Progress Notes (Signed)
Faxed to Dr Milbert Coulter, PCP

## 2011-11-06 ENCOUNTER — Encounter: Payer: Self-pay | Admitting: Gastroenterology

## 2011-11-06 ENCOUNTER — Ambulatory Visit (INDEPENDENT_AMBULATORY_CARE_PROVIDER_SITE_OTHER): Payer: Medicare Other | Admitting: Gastroenterology

## 2011-11-06 ENCOUNTER — Other Ambulatory Visit: Payer: Self-pay | Admitting: Internal Medicine

## 2011-11-06 VITALS — BP 135/80 | HR 72 | Temp 98.3°F | Ht 64.0 in | Wt 194.6 lb

## 2011-11-06 DIAGNOSIS — D509 Iron deficiency anemia, unspecified: Secondary | ICD-10-CM

## 2011-11-06 DIAGNOSIS — K219 Gastro-esophageal reflux disease without esophagitis: Secondary | ICD-10-CM

## 2011-11-06 DIAGNOSIS — F1011 Alcohol abuse, in remission: Secondary | ICD-10-CM

## 2011-11-06 MED ORDER — PANTOPRAZOLE SODIUM 40 MG PO TBEC: DELAYED_RELEASE_TABLET | ORAL | Status: DC

## 2011-11-06 NOTE — Assessment & Plan Note (Addendum)
History of iron deficiency anemia, resolved with iron supplement. No prior colonoscopy. Patient never returned Hemoccult card.  We have scheduled you for a colonoscopy with Dr. Jena Gauss in the near future. I have discussed the risks, alternatives, benefits with regards to but not limited to the risk of reaction to medication, bleeding, infection, perforation and the patient is agreeable to proceed. Written consent to be obtained.  Please see separate instructions. Procedure will be done in the OR so we can ensure adequate sedation given hx of etoh abuse and polypharmacy. Day of bowel prep, decrease your Lantus to 14 units at bedtime, decreased metformin to 500 mg in the morning and 500 mg in the evening. Hold your iron supplement for 7 days prior to your procedure.

## 2011-11-06 NOTE — Progress Notes (Signed)
Faxed to PCP, Dr Milbert Coulter

## 2011-11-06 NOTE — Assessment & Plan Note (Signed)
Okay to go back on Protonix twice a day. New prescriptions in to your pharmacy.

## 2011-11-06 NOTE — Progress Notes (Signed)
Primary Care Physician:  Vernice Jefferson, MD  Primary Gastroenterologist:  Roetta Sessions, MD   Chief Complaint  Patient presents with  . Colonoscopy    HPI:  [name] is a 46 y.o. female here today to reschedule her colonoscopy. She was last seen in the office on 09/28/2011 by Dr. Jena Gauss. She has a history of GERD, dysphagia status post dilation in April. In the past few months has been troubled with abdominal pain and diarrhea but she tells me that this is all resolved. Stool studies were unremarkable in June. She has intermittent rectal bleeding chronically but none recently. She has a history of iron deficiency anemia which resolved with iron supplements. No first-degree relatives with colon cancer.  Started back taking PPI twice a day due to felt like not working at once daily. Fine until early evening. Feels like food getting stuck in center of chest. If belch a lot it seems to help. Cannot tolerate once daily. If taking BID, feels like food goes down okay and symptoms adequately controlled.No abdominal pain. This AM, vomited after pills. Staying constipated lately. MOM few times the last few weeks. No diarrhea. No melena.     Current Outpatient Prescriptions  Medication Sig Dispense Refill  . cetirizine (ZYRTEC) 10 MG tablet Take 1 tablet (10 mg total) by mouth daily.  30 tablet  3  . diphenhydrAMINE (BENADRYL) 25 MG tablet Take 50 mg by mouth daily as needed. Sleep/allergies      . ferrous gluconate (FERGON) 240 (27 FE) MG tablet Take 240 mg by mouth 2 (two) times daily.      . hydrochlorothiazide (HYDRODIURIL) 25 MG tablet TAKE 1 TABLET BY MOUTH DAILY.  30 tablet  3  . insulin glargine (LANTUS) 100 UNIT/ML injection Inject 28 Units into the skin at bedtime.      Marland Kitchen LORazepam (ATIVAN) 0.5 MG tablet Take 1-2 tablets (0.5-1 mg total) by mouth 2 (two) times daily as needed. For anxiety  90 tablet  0  . lovastatin (MEVACOR) 20 MG tablet Take 1 tablet (20 mg total) by mouth at  bedtime.  31 tablet  3  . metFORMIN (GLUCOPHAGE) 1000 MG tablet Take 1 tablet (1,000 mg total) by mouth 2 (two) times daily with a meal.  62 tablet  3  . metoprolol (LOPRESSOR) 50 MG tablet Take 1 tablet (50 mg total) by mouth 2 (two) times daily.  62 tablet  11  . mometasone (NASONEX) 50 MCG/ACT nasal spray Place 2 sprays into the nose daily.      . nicotine (NICODERM CQ - DOSED IN MG/24 HOURS) 21 mg/24hr patch Place 1 patch onto the skin daily.  14 patch  1  . pantoprazole (PROTONIX) 40 MG tablet 1 PO 30 MINUTES PRIOR TO MEALS BID  62 tablet  3  . potassium chloride SA (K-DUR,KLOR-CON) 20 MEQ tablet Take 1 tablet (20 mEq total) by mouth 2 (two) times daily.  60 tablet  4  . ziprasidone (GEODON) 40 MG capsule Take 40 mg by mouth every morning.       . ziprasidone (GEODON) 60 MG capsule Take 60 mg by mouth at bedtime.          Allergies as of 11/06/2011 - Review Complete 11/06/2011  Allergen Reaction Noted  . Metronidazole Shortness Of Breath and Swelling   . Orange Itching 03/27/2011  . Shrimp (shellfish allergy) Shortness Of Breath and Itching 03/27/2011  . Penicillins Hives and Swelling   . Sulfonamide derivatives Hives   .  Glipizide Other (See Comments) 01/15/2011  . Sulfamethoxazole w-trimethoprim Rash 02/27/2010    Past Medical History  Diagnosis Date  . Arteriosclerotic cardiovascular disease (ASCVD)     Minimal at cath in [facility].stress nuclear study in 8/08 with nl EF; neg stress echo in 2010  . Diabetes mellitus, type 2 2000    Onset in 2000; no insulin  . Hyperlipidemia   . Hypertension `    during treatment with Geodon  . Gastroesophageal reflux disease     Schatzki's ring  . Anemia, iron deficiency   . Alcohol abuse   . Depression   . Community acquired pneumonia 01/03/10    2011; with pleural effusion-[facility] acute resp failure  . Obesity   . Community acquired pneumonia 05/2010  . Schizoaffective disorder     requiring multiple psychiatric  admissions  . Dysphagia     Past Surgical History  Procedure Date  . Dilation and curettage, diagnostic / therapeutic 1992  . Esophagogastroduodenoscopy 09/16/08    Dr. Ronni Rumble hiatal hernia/excoriations involving the cardia and mucosa consistent with trauma, antral erosions  of linear petechiae ? gastritis versus early gastric antral vascular  ectasia.Marland Kitchen biopsy showed reactive gastropathy. No H. pylori.  . Esophagogastroduodenoscopy 09/2007    Dr. Rinaldo Ratel ring, dilated to 56 French Maloney dilator, small hiatal hernia, antral erosions, biopsies reactive gastropathy.  Gaspar Bidding dilation 07/17/2011    Fields-distal esophageal stricture s/p dilation, chronic gastritis, multiple ulcers in stomach. no h.pylori    Family History  Problem Relation Age of Onset  . Colon cancer Other   . Hypertension Mother   . Stroke Father     deceased at age 44  . Heart disease Sister   . Anesthesia problems Neg Hx   . Hypotension Neg Hx   . Malignant hyperthermia Neg Hx   . Pseudochol deficiency Neg Hx     History   Social History  . Marital Status: Single    Spouse Name: N/A    Number of Children: N/A  . Years of Education: 12   Occupational History  . UNEMPLOYED   . UNEMPLOYED    Social History Main Topics  . Smoking status: Current Everyday Smoker -- 1.5 packs/day for 30 years    Types: Cigarettes  . Smokeless tobacco: Not on file   Comment: Sometimes she smokes more; up to 2 packs/day.  . Alcohol Use: 0.0 oz/week     occasional; intake has decreased since she found out she has ulcer  . Drug Use: No  . Sexually Active: No   Other Topics Concern  . 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 Eyes: Negative for vision changes.  ENT: Negative for hoarseness, difficulty swallowing , nasal congestion. CV: Negative for chest pain, angina, palpitations, dyspnea on exertion, peripheral edema.  Respiratory: Negative for dyspnea at rest, dyspnea on exertion, cough, sputum, wheezing.  GI: See history of present illness. GU:  Negative for dysuria, hematuria, urinary incontinence, urinary frequency, nocturnal urination.  MS: Negative for joint pain, low back pain.  Derm: Negative for rash or itching.  Neuro: Negative for weakness, abnormal sensation, seizure, frequent headaches, memory loss, confusion.  Psych: Negative  for anxiety, depression, suicidal ideation, hallucinations.  Endo: Negative for unusual weight change.  Heme: Negative for bruising or bleeding. Allergy: Negative for rash or hives.    Physical Examination:  BP 135/80  Pulse 72  Temp 98.3 F (36.8 C) (Temporal)  Ht 5\' 4"  (1.626 m)  Wt 194 lb 9.6 oz (88.27 kg)  BMI 33.40 kg/m2  LMP 10/22/2011   General: Well-nourished, well-developed in no acute distress.  Head: Normocephalic, atraumatic.   Eyes: Conjunctiva pink, no icterus. Mouth: Oropharyngeal mucosa moist and pink , no lesions erythema or exudate. Neck: Supple without thyromegaly, masses, or lymphadenopathy.  Lungs: Clear to auscultation bilaterally.  Heart: Regular rate and rhythm, no murmurs rubs or gallops.  Abdomen: Bowel sounds are normal, nontender, nondistended, no hepatosplenomegaly or masses, no abdominal bruits or    hernia , no rebound or guarding.   Rectal: deferred to time of colonoscopy Extremities: No lower extremity edema. No clubbing or deformities.  Neuro: Alert and oriented x 4 , grossly normal neurologically.  Skin: Warm and dry, no rash or jaundice.   Psych: Alert and cooperative, normal mood and affect.  Labs: Lab Results  Component Value Date   WBC 8.2 09/17/2011   HGB 12.6 09/17/2011    HCT 37.8 09/17/2011   MCV 83.6 09/17/2011   PLT 238 09/17/2011     Imaging Studies: No results found.

## 2011-11-06 NOTE — Patient Instructions (Signed)
  We have scheduled you for a colonoscopy with Dr. Jena Gauss in the near future. Please see separate instructions. Procedure will be done in the OR so we can ensure adequate sedation. Day of bowel prep, decrease your Lantus to 14 units at bedtime, decreased metformin to 500 mg in the morning and 500 mg in the evening. Hold your iron supplement for 7 days prior to your procedure. Continue protonix to twice daily.

## 2011-11-08 ENCOUNTER — Telehealth: Payer: Self-pay | Admitting: *Deleted

## 2011-11-08 NOTE — Telephone Encounter (Signed)
Pt calls and ask for new allergy med, appt given for 7/31 at 1545 per sharonb.

## 2011-11-14 ENCOUNTER — Ambulatory Visit (INDEPENDENT_AMBULATORY_CARE_PROVIDER_SITE_OTHER): Payer: Medicare Other | Admitting: Internal Medicine

## 2011-11-14 ENCOUNTER — Encounter: Payer: Self-pay | Admitting: *Deleted

## 2011-11-14 ENCOUNTER — Encounter: Payer: Self-pay | Admitting: Internal Medicine

## 2011-11-14 VITALS — BP 128/90 | HR 107 | Temp 99.1°F | Wt 197.2 lb

## 2011-11-14 DIAGNOSIS — J309 Allergic rhinitis, unspecified: Secondary | ICD-10-CM | POA: Diagnosis not present

## 2011-11-14 DIAGNOSIS — R197 Diarrhea, unspecified: Secondary | ICD-10-CM

## 2011-11-14 DIAGNOSIS — I10 Essential (primary) hypertension: Secondary | ICD-10-CM

## 2011-11-14 DIAGNOSIS — E119 Type 2 diabetes mellitus without complications: Secondary | ICD-10-CM | POA: Diagnosis not present

## 2011-11-14 DIAGNOSIS — F329 Major depressive disorder, single episode, unspecified: Secondary | ICD-10-CM | POA: Diagnosis not present

## 2011-11-14 NOTE — Progress Notes (Signed)
Subjective:   Patient ID: [name] female   DOB: [birth year] 46 y.o.   MRN: [id number]  HPI: [name] is a 46 y.o. woman with history of diabetes and hypertension presents with complaints of diarrhea. She reports that 4 days prior she was at the beach and had salad for dinner and subsequently had diarrhea all night. She reports her sister had similar symptoms. Diarrhea has since resolved and she did not have diarrhea past Monday. Her last bowel movement was today, and it was formed. She had no vomiting. No nausea. She does report decreased appetite, but had sausage and macaroni and cheese for breakfast this morning. She does report some dizziness, lightheadedness, feeling faint, but denies the room spinning. She has tried to increase her fluid intake but reports that she has been drinking tea. No increase in urination.  She reports that her family has been dealing with increased stress recently, as her uncle and aunt passed away about one month prior. Her uncle as a father figure to her. She feels that she has been dealing with the grief relatively well, but has felt more depressed. She feels that over the last weekend her symptoms of depression were compounded by acute illness and symptoms of PMS, as her cycle started 5 days prior. She is being followed by nurse practitioner at [facility], who does discuss her problems with her. Her appointment was a month ago prior to her uncle's death. Her next scheduled appointment is 4 months from now.   Current Outpatient Prescriptions  Medication Sig Dispense Refill  . ferrous gluconate (FERGON) 240 (27 FE) MG tablet Take 240 mg by mouth 2 (two) times daily.      . hydrochlorothiazide (HYDRODIURIL) 25 MG tablet TAKE 1 TABLET BY MOUTH DAILY.  30 tablet  3  . insulin glargine (LANTUS) 100 UNIT/ML injection Inject 28 Units into the skin at bedtime.      Marland Kitchen LORazepam (ATIVAN) 0.5 MG tablet Take 1-2 tablets (0.5-1 mg total) by mouth 2 (two) times daily  as needed. For anxiety  90 tablet  0  . lovastatin (MEVACOR) 20 MG tablet Take 1 tablet (20 mg total) by mouth at bedtime.  31 tablet  3  . metFORMIN (GLUCOPHAGE) 1000 MG tablet Take 1 tablet (1,000 mg total) by mouth 2 (two) times daily with a meal.  62 tablet  3  . metoprolol (LOPRESSOR) 50 MG tablet Take 1 tablet (50 mg total) by mouth 2 (two) times daily.  62 tablet  11  . mometasone (NASONEX) 50 MCG/ACT nasal spray Place 2 sprays into the nose daily.      . pantoprazole (PROTONIX) 40 MG tablet 1 PO 30 MINUTES PRIOR TO MEALS BID  62 tablet  5  . potassium chloride SA (K-DUR,KLOR-CON) 20 MEQ tablet Take 1 tablet (20 mEq total) by mouth 2 (two) times daily.  60 tablet  4  . ziprasidone (GEODON) 40 MG capsule Take 40 mg by mouth every morning.       . ziprasidone (GEODON) 60 MG capsule Take 60 mg by mouth at bedtime.        . cetirizine (ZYRTEC) 10 MG tablet Take 1 tablet (10 mg total) by mouth daily.  30 tablet  3  . diphenhydrAMINE (BENADRYL) 25 MG tablet Take 50 mg by mouth daily as needed. Sleep/allergies       Review of Systems: Constitutional: Denies fever, chills, diaphoresis, appetite change and fatigue.  HEENT: Denies photophobia, eye pain, redness, hearing loss, ear  pain, congestion, sore throat, rhinorrhea, sneezing, mouth sores, trouble swallowing, neck pain, neck stiffness and tinnitus.   Respiratory: Denies SOB, DOE, cough, chest tightness,  and wheezing.   Cardiovascular: Denies chest pain and leg swelling.  Gastrointestinal: Denies nausea, vomiting, abdominal pain, constipation, blood in stool (difficult to discern as she had started her menstrual cycle) and abdominal distention.  Genitourinary: Denies dysuria, urgency, frequency, hematuria, flank pain and difficulty urinating.  Musculoskeletal: Denies myalgias, back pain, joint swelling, arthralgias and gait problem.  Skin: Denies pallor, rash and wound.  Neurological: Denies dizziness, seizures, syncope, weakness,  light-headedness, numbness and headaches.    Objective:  Physical Exam: Filed Vitals:   11/14/11 1545 11/14/11 1551 11/14/11 1553 11/14/11 1555  BP: 122/83 119/81 125/88 128/90  Pulse: 81 79 87 107  Temp: 99.1 F (37.3 C)     TempSrc: Oral     Weight: 197 lb 3.2 oz (89.449 kg)      orthostatic vital signs: Supine blood pressure 119/81, heart rate 79 Standing blood pressure 120/90 heart rate 107  Constitutional: Vital signs reviewed.  Patient is a well-developed and well-nourished woman in no acute distress and cooperative with exam.  Mouth: no erythema or exudates, dry mucous membranes Eyes: PERRL, EOMI, conjunctivae normal, No scleral icterus.  Neck: Supple, Trachea midline normal ROM, No JVD, mass, thyromegaly, or carotid bruit present.  Cardiovascular: Tachycardic, S1 normal, S2 normal, no MRG, pulses symmetric and intact bilaterally Pulmonary/Chest: CTAB, no wheezes, rales, or rhonchi Abdominal: Soft. Non-tender, non-distended, bowel sounds are normal, no masses, organomegaly, or guarding present.  Skin: Warm, dry and intact. No rash, cyanosis, or clubbing.  Psychiatric: Normal mood and affect. speech and behavior is normal. Judgment and thought content normal. Cognition and memory are normal.   Assessment & Plan:  Case and care discussed with Dr. Meredith [name]. Patient to return in 2 months for diabetes followup. Please see problem oriented charting for further details.

## 2011-11-14 NOTE — Patient Instructions (Addendum)
-  Please be sure to hydrate yourself with water over the next 1-2 weeks.  If you have recurrent diarrhea or worsening symptoms, please call us!  -When you come see me in September, we will recheck your cholesterol and hemoglobin A1c - in the mean time, keep up a healthy diet and try to get some exercise in!  -If you feel nauseated, try a bland diet - bread, rice, applesauce etc.  -Please try to make an appointment as soon as possible to see your nurse practitioner at [facility].  Please be sure to bring all of your medications with you to every visit.  Should you have any new or worsening symptoms, please be sure to call the clinic at [phone].

## 2011-11-14 NOTE — Assessment & Plan Note (Signed)
Lab Results  Component Value Date   NA 134* 09/17/2011   K 3.6 09/17/2011   CL 100 09/17/2011   CO2 22 09/17/2011   BUN 10 09/17/2011   CREATININE 0.69 09/17/2011   CREATININE 0.78 08/10/2011    BP Readings from Last 3 Encounters:  11/14/11 128/90  11/06/11 135/80  10/22/11 124/88    Assessment: Hypertension control:  mildly elevated  Progress toward goals:  at goal Barriers to meeting goals:  no barriers identified  Plan: Hypertension treatment:  continue current medications

## 2011-11-14 NOTE — Assessment & Plan Note (Signed)
Patient had acute episode of diarrhea over the weekend. Episode seems to have resolved but patient seems to still be dehydrated based on orthostatic heart rate. Encouraged patient to increase water intake. If she feels nauseated she should maintain a bland diet inclusive of bread rice applesauce etc. She should return to clinic if diarrhea recurs or symptoms persist and worsen.

## 2011-11-14 NOTE — Assessment & Plan Note (Signed)
Patient compliant with diabetic regimen. She is to return in September for diabetes followup.

## 2011-11-14 NOTE — Assessment & Plan Note (Signed)
Patient has felt more depressed over the weekend with the acute episode of diarrhea. She denies suicidal or homicidal ideation. She is being followed at [facility] and they're prescribing Ativan and Geodon. She believes she has PTSD, chart review suggests schizoaffective disorder. She requests a medication to get her over this acute depression, as she has recently lost a father figure.  -No new medications given, encouraged that she schedule an appointment as soon as possible for close followup care.

## 2011-11-15 ENCOUNTER — Encounter (HOSPITAL_COMMUNITY): Payer: Self-pay | Admitting: *Deleted

## 2011-11-15 ENCOUNTER — Emergency Department (HOSPITAL_COMMUNITY)
Admission: EM | Admit: 2011-11-15 | Discharge: 2011-11-15 | Disposition: A | Discharge status: Home / Self Care | Payer: Medicare Other | Attending: Emergency Medicine | Admitting: Emergency Medicine

## 2011-11-15 DIAGNOSIS — Z794 Long term (current) use of insulin: Secondary | ICD-10-CM | POA: Insufficient documentation

## 2011-11-15 DIAGNOSIS — E669 Obesity, unspecified: Secondary | ICD-10-CM | POA: Insufficient documentation

## 2011-11-15 DIAGNOSIS — I10 Essential (primary) hypertension: Secondary | ICD-10-CM | POA: Diagnosis not present

## 2011-11-15 DIAGNOSIS — F259 Schizoaffective disorder, unspecified: Secondary | ICD-10-CM | POA: Insufficient documentation

## 2011-11-15 DIAGNOSIS — D509 Iron deficiency anemia, unspecified: Secondary | ICD-10-CM | POA: Diagnosis not present

## 2011-11-15 DIAGNOSIS — E860 Dehydration: Secondary | ICD-10-CM | POA: Diagnosis not present

## 2011-11-15 DIAGNOSIS — F17200 Nicotine dependence, unspecified, uncomplicated: Secondary | ICD-10-CM | POA: Diagnosis not present

## 2011-11-15 DIAGNOSIS — E785 Hyperlipidemia, unspecified: Secondary | ICD-10-CM | POA: Insufficient documentation

## 2011-11-15 DIAGNOSIS — Z79899 Other long term (current) drug therapy: Secondary | ICD-10-CM | POA: Insufficient documentation

## 2011-11-15 DIAGNOSIS — R42 Dizziness and giddiness: Secondary | ICD-10-CM | POA: Diagnosis not present

## 2011-11-15 DIAGNOSIS — K219 Gastro-esophageal reflux disease without esophagitis: Secondary | ICD-10-CM | POA: Insufficient documentation

## 2011-11-15 DIAGNOSIS — E119 Type 2 diabetes mellitus without complications: Secondary | ICD-10-CM | POA: Insufficient documentation

## 2011-11-15 LAB — BASIC METABOLIC PANEL
BUN: 15 mg/dL (ref 6–23)
CO2: 26 mEq/L (ref 19–32)
Calcium: 10.2 mg/dL (ref 8.4–10.5)
Chloride: 96 mEq/L (ref 96–112)
Creatinine, Ser: 0.77 mg/dL (ref 0.50–1.10)
Glucose, Bld: 174 mg/dL — ABNORMAL HIGH (ref 70–99)

## 2011-11-15 LAB — CBC WITH DIFFERENTIAL/PLATELET
Eosinophils Relative: 3 % (ref 0–5)
HCT: 38.7 % (ref 36.0–46.0)
Hemoglobin: 13.2 g/dL (ref 12.0–15.0)
Lymphocytes Relative: 43 % (ref 12–46)
Lymphs Abs: 3.4 10*3/uL (ref 0.7–4.0)
MCV: 83.9 fL (ref 78.0–100.0)
Monocytes Absolute: 0.4 10*3/uL (ref 0.1–1.0)
Monocytes Relative: 5 % (ref 3–12)
Neutro Abs: 3.9 10*3/uL (ref 1.7–7.7)
RDW: 15.3 % (ref 11.5–15.5)

## 2011-11-15 LAB — URINALYSIS, ROUTINE W REFLEX MICROSCOPIC
Glucose, UA: NEGATIVE mg/dL
Ketones, ur: NEGATIVE mg/dL
Leukocytes, UA: NEGATIVE
pH: 5.5 (ref 5.0–8.0)

## 2011-11-15 LAB — URINE MICROSCOPIC-ADD ON

## 2011-11-15 MED ORDER — SODIUM CHLORIDE 0.9 % IV SOLN: INTRAVENOUS | Status: DC

## 2011-11-15 MED ORDER — SODIUM CHLORIDE 0.9 % IV BOLUS (SEPSIS)
1000.0000 mL | Freq: Once | INTRAVENOUS | Status: AC
  Administered 2011-11-15: 1000 mL via INTRAVENOUS

## 2011-11-15 MED ORDER — MOMETASONE FUROATE 50 MCG/ACT NA SUSP: NASAL | Status: DC

## 2011-11-15 NOTE — Addendum Note (Signed)
Addended by: Alanson Puls on: 11/15/2011 10:59 AM   Modules accepted: Orders

## 2011-11-15 NOTE — ED Notes (Signed)
Diarrhea on Sunday.  Seen by MD on 7/31, and had dizziness, thought she had  Dehydration.  Feels she is still dehydrated and weak.  No vomiting , but nausea, Felt sweaty while at [facility].

## 2011-11-15 NOTE — ED Provider Notes (Signed)
History  This chart was scribed for Toy Baker, MD by Ladona Ridgel Day. This patient was seen in room APA10/APA10 and the patient's care was started at 2030.   CSN: [id number]  Arrival date & time 11/15/11  2030   First MD Initiated Contact with Patient 11/15/11 2114      Chief Complaint  Patient presents with  . Dizziness    The history is provided by the patient. No language interpreter was used.   [name] is a 46 y.o. female who presents to the Emergency Department complaining of feeling dehydrated for one day. She was recently seen by her PCP after watery diarrhea episodes which she no longer has and was told to hydrate herself. She states that she still feels thirsty, swimmy headed worse with standing and with a HA. She denies any current diarrhea, emesis, and fever. She has a history of DM, she checked her BS at home today: 143.  Past Medical History  Diagnosis Date  . Arteriosclerotic cardiovascular disease (ASCVD)     Minimal at cath in [facility].stress nuclear study in 8/08 with nl EF; neg stress echo in 2010  . Diabetes mellitus, type 2 2000    Onset in 2000; no insulin  . Hyperlipidemia   . Hypertension `    during treatment with Geodon  . Gastroesophageal reflux disease     Schatzki's ring  . Anemia, iron deficiency   . Alcohol abuse   . Depression   . Community acquired pneumonia 01/03/10    2011; with pleural effusion-[facility] acute resp failure  . Obesity   . Community acquired pneumonia 05/2010  . Schizoaffective disorder     requiring multiple psychiatric admissions  . Dysphagia     Past Surgical History  Procedure Date  . Dilation and curettage, diagnostic / therapeutic 1992  . Esophagogastroduodenoscopy 09/16/08    Dr. Ronni Rumble hiatal hernia/excoriations involving the cardia and mucosa consistent with trauma, antral erosions  of linear petechiae ? gastritis versus early gastric antral vascular  ectasia.Marland Kitchen biopsy showed reactive  gastropathy. No H. pylori.  . Esophagogastroduodenoscopy 09/2007    Dr. Rinaldo Ratel ring, dilated to 56 French Maloney dilator, small hiatal hernia, antral erosions, biopsies reactive gastropathy.  Gaspar Bidding dilation 07/17/2011    Fields-distal esophageal stricture s/p dilation, chronic gastritis, multiple ulcers in stomach. no h.pylori    Family History  Problem Relation Age of Onset  . Colon cancer Other   . Hypertension Mother   . Stroke Father     deceased at age 48  . Heart disease Sister   . Anesthesia problems Neg Hx   . Hypotension Neg Hx   . Malignant hyperthermia Neg Hx   . Pseudochol deficiency Neg Hx     History  Substance Use Topics  . Smoking status: Current Everyday Smoker -- 1.5 packs/day for 30 years    Types: Cigarettes  . Smokeless tobacco: Not on file   Comment: Sometimes she smokes more; up to 2 packs/day.  . Alcohol Use: 0.0 oz/week     occasional; intake has decreased since she found out she has ulcer    OB History    Grav Para Term Preterm Abortions TAB SAB Ect Mult Living                  Review of Systems A complete 10 system review of systems was obtained and all systems are negative except as noted in the HPI and PMH.   Allergies  Metronidazole;  Orange; Shrimp; Penicillins; Sulfonamide derivatives; Glipizide; and Sulfamethoxazole w-trimethoprim  Home Medications   Current Outpatient Rx  Name Route Sig Dispense Refill  . CETIRIZINE HCL 10 MG PO TABS Oral Take 1 tablet (10 mg total) by mouth daily. 30 tablet 3  . DIPHENHYDRAMINE HCL 25 MG PO TABS Oral Take 50 mg by mouth daily as needed. Sleep/allergies    . FERROUS GLUCONATE 240 (27 FE) MG PO TABS Oral Take 240 mg by mouth 2 (two) times daily.    Marland Kitchen HYDROCHLOROTHIAZIDE 25 MG PO TABS  TAKE 1 TABLET BY MOUTH DAILY. 30 tablet 3  . INSULIN GLARGINE 100 UNIT/ML [place] SOLN Subcutaneous Inject 28 Units into the skin at bedtime.    Marland Kitchen LORAZEPAM 0.5 MG PO TABS Oral Take 1-2 tablets (0.5-1 mg total) by  mouth 2 (two) times daily as needed. For anxiety 90 tablet 0  . LOVASTATIN 20 MG PO TABS Oral Take 1 tablet (20 mg total) by mouth at bedtime. 31 tablet 3  . METFORMIN HCL 1000 MG PO TABS Oral Take 1 tablet (1,000 mg total) by mouth 2 (two) times daily with a meal. 62 tablet 3  . METOPROLOL TARTRATE 50 MG PO TABS Oral Take 1 tablet (50 mg total) by mouth 2 (two) times daily. 62 tablet 11  . MOMETASONE FUROATE 50 MCG/ACT NA SUSP  2 sprays in each nostril daily 17 g 12  . PANTOPRAZOLE SODIUM 40 MG PO TBEC  1 PO 30 MINUTES PRIOR TO MEALS BID 62 tablet 5  . POTASSIUM CHLORIDE CRYS ER 20 MEQ PO TBCR Oral Take 1 tablet (20 mEq total) by mouth 2 (two) times daily. 60 tablet 4  . ZIPRASIDONE HCL 40 MG PO CAPS Oral Take 40 mg by mouth every morning.     Marland Kitchen ZIPRASIDONE HCL 60 MG PO CAPS Oral Take 60 mg by mouth at bedtime.        Triage Vitals: BP 139/95  Pulse 105  Temp 98.6 F (37 C) (Oral)  Resp 20  Ht 5\' 4"  (1.626 m)  Wt 197 lb (89.359 kg)  BMI 33.82 kg/m2  SpO2 100%  LMP 11/11/2011  Physical Exam  Nursing note and vitals reviewed. Constitutional: She is oriented to person, place, and time. She appears well-developed and well-nourished. No distress.  HENT:  Head: Normocephalic and atraumatic.  Eyes: EOM are normal.  Neck: Neck supple. No tracheal deviation present.  Cardiovascular: Normal rate.   Pulmonary/Chest: Effort normal. No respiratory distress.  Musculoskeletal: Normal range of motion.  Neurological: She is alert and oriented to person, place, and time.  Skin: Skin is warm and dry.  Psychiatric: She has a normal mood and affect. Her behavior is normal.    ED Course  Procedures (including critical care time) DIAGNOSTIC STUDIES: Oxygen Saturation is 100% on room air, normal by my interpretation.    COORDINATION OF CARE: At 930 PM Discussed treatment plan with patient which includes IV fluids, urine analysis, and blood work. Patient agrees.    Labs Reviewed  CBC WITH  DIFFERENTIAL  BASIC METABOLIC PANEL  URINALYSIS, ROUTINE W REFLEX MICROSCOPIC  URINE CULTURE   No results found.   No diagnosis found.    MDM   I personally performed the services described in this documentation, which was scribed in my presence. The recorded information has been reviewed and considered.  Pt given iv fluids and feels better, stable for discharge        Toy Baker, MD 11/15/11 2230

## 2011-11-17 LAB — URINE CULTURE: Colony Count: >=100000

## 2011-11-18 NOTE — ED Notes (Signed)
+   Urine Chart sent to EDP office for review. 

## 2011-11-19 NOTE — ED Notes (Signed)
Chart returned from EDP office. "Likely contaminant. No abx tx needed at this time." Reviewed by Grant Fontana PA-C.

## 2011-11-22 ENCOUNTER — Telehealth: Payer: Self-pay | Admitting: Internal Medicine

## 2011-11-22 ENCOUNTER — Inpatient Hospital Stay (HOSPITAL_COMMUNITY): Admission: RE | Admit: 2011-11-22 | Discharge: 2011-11-22 | Payer: Medicare Other | Source: Ambulatory Visit

## 2011-11-22 NOTE — Telephone Encounter (Signed)
Patient call and cancelled her procedure appointment on August 15th with RMR she stated she had a lot going on right now an that she would call us back to R/S at a later time

## 2011-11-22 NOTE — Patient Instructions (Signed)
20 [address]RINA [address]ES  11/22/2011   Your procedure is scheduled on:  11/29/11  Report to [facility] at 06:15 AM.  Call this number if you have problems the morning of surgery: [phone]   Remember:   Do not eat or drink:After Midnight.  Take these medicines the morning of surgery with A SIP OF WATER: HCTZ, Metoprolol, Protonix, and Geodon. Also, take your Nasonex. Only take half of your Lantus the night before  surgery = 14 units. Do not take your Metformin the morning of surgery.    Do not wear jewelry, make-up or nail polish.  Do not wear lotions, powders, or perfumes. You may wear deodorant.  Do not bring valuables to the hospital.  Contacts, dentures or bridgework may not be worn into surgery.  Leave suitcase in the car. After surgery it may be brought to your room.  For patients admitted to the hospital, checkout time is 11:00 AM the day of discharge.   Patients discharged the day of surgery will not be allowed to drive home.  Special Instructions: Take your bowel prep as ordered by your physician.   Please read over the following fact sheets that you were given: Pain Booklet, Anesthesia Post-op Instructions and Care and Recovery After Surgery    Colonoscopy A colonoscopy is an exam to evaluate your entire colon. In this exam, your colon is cleansed. A long fiberoptic tube is inserted through your rectum and into your colon. The fiberoptic scope (endoscope) is a long bundle of enclosed and very flexible fibers. These fibers transmit light to the area examined and send images from that area to your caregiver. Discomfort is usually minimal. You may be given a drug to help you sleep (sedative) during or prior to the procedure. This exam helps to detect lumps (tumors), polyps, inflammation, and areas of bleeding. Your caregiver may also take a small piece of tissue (biopsy) that will be examined under a microscope. LET YOUR CAREGIVER KNOW ABOUT:   Allergies to food or medicine.   Medicines  taken, including vitamins, herbs, eyedrops, over-the-counter medicines, and creams.   Use of steroids (by mouth or creams).   Previous problems with anesthetics or numbing medicines.   History of bleeding problems or blood clots.   Previous surgery.   Other health problems, including diabetes and kidney problems.   Possibility of pregnancy, if this applies.  BEFORE THE PROCEDURE   A clear liquid diet may be required for 2 days before the exam.   Ask your caregiver about changing or stopping your regular medications.   Liquid injections (enemas) or laxatives may be required.   A large amount of electrolyte solution may be given to you to drink over a short period of time. This solution is used to clean out your colon.   You should be present 60 minutes prior to your procedure or as directed by your caregiver.  AFTER THE PROCEDURE   If you received a sedative or pain relieving medication, you will need to arrange for someone to drive you home.   Occasionally, there is a little blood passed with the first bowel movement. Do not be concerned.  FINDING OUT THE RESULTS OF YOUR TEST Not all test results are available during your visit. If your test results are not back during the visit, make an appointment with your caregiver to find out the results. Do not assume everything is normal if you have not heard from your caregiver or the medical facility. It is important for  you to follow up on all of your test results. HOME CARE INSTRUCTIONS   It is not unusual to pass moderate amounts of gas and experience mild abdominal cramping following the procedure. This is due to air being used to inflate your colon during the exam. Walking or a warm pack on your belly (abdomen) may help.   You may resume all normal meals and activities after sedatives and medicines have worn off.   Only take over-the-counter or prescription medicines for pain, discomfort, or fever as directed by your caregiver. Do  not use aspirin or blood thinners if a biopsy was taken. Consult your caregiver for medicine usage if biopsies were taken.  SEEK IMMEDIATE MEDICAL CARE IF:   You have a fever.   You pass large blood clots or fill a toilet with blood following the procedure. This may also occur 10 to 14 days following the procedure. This is more likely if a biopsy was taken.   You develop abdominal pain that keeps getting worse and cannot be relieved with medicine.  Document Released: 03/30/2000 Document Revised: 03/22/2011 Document Reviewed: 11/13/2007 [employer] Patient Information 2012 [place], [place].   PATIENT INSTRUCTIONS POST-ANESTHESIA  IMMEDIATELY FOLLOWING SURGERY:  Do not drive or operate machinery for the first twenty four hours after surgery.  Do not make any important decisions for twenty four hours after surgery or while taking narcotic pain medications or sedatives.  If you develop intractable nausea and vomiting or a severe headache please notify your doctor immediately.  FOLLOW-UP:  Please make an appointment with your surgeon as instructed. You do not need to follow up with anesthesia unless specifically instructed to do so.  WOUND CARE INSTRUCTIONS (if applicable):  Keep a dry clean dressing on the anesthesia/puncture wound site if there is drainage.  Once the wound has quit draining you may leave it open to air.  Generally you should leave the bandage intact for twenty four hours unless there is drainage.  If the epidural site drains for more than 36-48 hours please call the anesthesia department.  QUESTIONS?:  Please feel free to call your physician or the hospital operator if you have any questions, and they will be happy to assist you.

## 2011-11-29 ENCOUNTER — Ambulatory Visit (HOSPITAL_COMMUNITY): Admission: RE | Admit: 2011-11-29 | Payer: Medicare Other | Source: Ambulatory Visit | Admitting: Internal Medicine

## 2011-11-29 ENCOUNTER — Encounter (HOSPITAL_COMMUNITY): Admission: RE | Payer: Self-pay | Source: Ambulatory Visit

## 2011-11-29 SURGERY — COLONOSCOPY WITH PROPOFOL
Anesthesia: Monitor Anesthesia Care

## 2011-12-04 ENCOUNTER — Telehealth: Payer: Self-pay | Admitting: *Deleted

## 2011-12-04 NOTE — Telephone Encounter (Signed)
Pt is asking for nicotine 14 mg/24 hr patch.

## 2011-12-06 NOTE — Telephone Encounter (Signed)
Okay per Dr Everardo Beals.  Rx changed and given for 14 days.

## 2011-12-17 ENCOUNTER — Encounter (HOSPITAL_COMMUNITY): Payer: Self-pay | Admitting: *Deleted

## 2011-12-17 ENCOUNTER — Emergency Department (HOSPITAL_COMMUNITY)
Admission: EM | Admit: 2011-12-17 | Discharge: 2011-12-18 | Disposition: A | Discharge status: Home / Self Care | Payer: Medicare Other | Attending: Emergency Medicine | Admitting: Emergency Medicine

## 2011-12-17 DIAGNOSIS — E785 Hyperlipidemia, unspecified: Secondary | ICD-10-CM | POA: Diagnosis not present

## 2011-12-17 DIAGNOSIS — E119 Type 2 diabetes mellitus without complications: Secondary | ICD-10-CM | POA: Insufficient documentation

## 2011-12-17 DIAGNOSIS — E669 Obesity, unspecified: Secondary | ICD-10-CM | POA: Diagnosis not present

## 2011-12-17 DIAGNOSIS — I10 Essential (primary) hypertension: Secondary | ICD-10-CM | POA: Insufficient documentation

## 2011-12-17 DIAGNOSIS — Z79899 Other long term (current) drug therapy: Secondary | ICD-10-CM | POA: Insufficient documentation

## 2011-12-17 DIAGNOSIS — D509 Iron deficiency anemia, unspecified: Secondary | ICD-10-CM | POA: Insufficient documentation

## 2011-12-17 DIAGNOSIS — F17200 Nicotine dependence, unspecified, uncomplicated: Secondary | ICD-10-CM | POA: Diagnosis not present

## 2011-12-17 DIAGNOSIS — K219 Gastro-esophageal reflux disease without esophagitis: Secondary | ICD-10-CM | POA: Diagnosis not present

## 2011-12-17 DIAGNOSIS — F259 Schizoaffective disorder, unspecified: Secondary | ICD-10-CM | POA: Insufficient documentation

## 2011-12-17 DIAGNOSIS — Z794 Long term (current) use of insulin: Secondary | ICD-10-CM | POA: Diagnosis not present

## 2011-12-17 DIAGNOSIS — K625 Hemorrhage of anus and rectum: Secondary | ICD-10-CM | POA: Diagnosis not present

## 2011-12-17 MED ORDER — HYDROCODONE-ACETAMINOPHEN 5-325 MG PO TABS
1.0000 | ORAL_TABLET | Freq: Once | ORAL | Status: AC
  Administered 2011-12-17: 1 via ORAL
  Filled 2011-12-17: qty 1

## 2011-12-17 NOTE — ED Notes (Signed)
Pt presents with rectal bleeding, x 1 today. Large amount blood noted in bowl with bowel movement per pt. Pt states has had small amount rectal bleeding in past. Has seen Dr Conception Chancy  In past, had colonoscopy scheduled, however has canceled twice. Pt denies abdominal pain and vomiting. Also c/o headache without vision changes and nausea.

## 2011-12-17 NOTE — ED Notes (Addendum)
Rectal bleeding. 15 min pta, abd aches,   Having vaginal bleeding also.

## 2011-12-18 ENCOUNTER — Encounter: Payer: Self-pay | Admitting: Gastroenterology

## 2011-12-18 ENCOUNTER — Other Ambulatory Visit: Payer: Self-pay | Admitting: *Deleted

## 2011-12-18 LAB — CBC WITH DIFFERENTIAL/PLATELET
Eosinophils Relative: 1 % (ref 0–5)
HCT: 33.8 % — ABNORMAL LOW (ref 36.0–46.0)
Lymphocytes Relative: 20 % (ref 12–46)
Lymphs Abs: 1.8 10*3/uL (ref 0.7–4.0)
MCV: 84.9 fL (ref 78.0–100.0)
Monocytes Absolute: 0.3 10*3/uL (ref 0.1–1.0)
RBC: 3.98 MIL/uL (ref 3.87–5.11)
RDW: 15.1 % (ref 11.5–15.5)
WBC: 8.8 10*3/uL (ref 4.0–10.5)

## 2011-12-18 LAB — BASIC METABOLIC PANEL
BUN: 15 mg/dL (ref 6–23)
CO2: 23 mEq/L (ref 19–32)
Calcium: 9.9 mg/dL (ref 8.4–10.5)
Creatinine, Ser: 0.81 mg/dL (ref 0.50–1.10)
Glucose, Bld: 126 mg/dL — ABNORMAL HIGH (ref 70–99)

## 2011-12-18 NOTE — ED Provider Notes (Signed)
Medical screening examination/treatment/procedure(s) were performed by non-physician practitioner and as supervising physician I was immediately available for consultation/collaboration.  Nicoletta Dress. Colon Branch, MD 12/18/11 [phone]

## 2011-12-18 NOTE — ED Notes (Signed)
Patient states she is nauseated; requesting something to drink.  Neville Route, PA aware and ok for patient to drink.  Patient given Coke.  States she thinks the pain pill is making her nauseated.  Patient states her pain is 1/10.

## 2011-12-18 NOTE — ED Provider Notes (Signed)
History     CSN: [id number]  Arrival date & time 12/17/11  1958   First MD Initiated Contact with Patient 12/17/11 2255      Chief Complaint  Patient presents with  . Rectal Bleeding    (Consider location/radiation/quality/duration/timing/severity/associated sxs/prior treatment) HPI Comments: Noted blood on TP after a BM today.  Small amount of blood noted on stools.  No recent hematochezia or black stools.  No h/o GI bleeding.  No known hemorrhoids.  Has seen dr. Kendell Bane in the past   Patient is a 46 y.o. female presenting with hematochezia. The history is provided by the patient. No language interpreter was used.  Rectal Bleeding  The current episode started today. The stool is described as streaked with blood. Pertinent negatives include no fever, no abdominal pain, no diarrhea, no nausea, no rectal pain and no vomiting. Her past medical history does not include abdominal surgery, developmental delay, Hirschsprung's disease, inflammatory bowel disease, recent abdominal injury, recent antibiotic use, recent change in diet or a recent illness. There were no sick contacts.    Past Medical History  Diagnosis Date  . Arteriosclerotic cardiovascular disease (ASCVD)     Minimal at cath in [facility].stress nuclear study in 8/08 with nl EF; neg stress echo in 2010  . Diabetes mellitus, type 2 2000    Onset in 2000; no insulin  . Hyperlipidemia   . Hypertension `    during treatment with Geodon  . Gastroesophageal reflux disease     Schatzki's ring  . Anemia, iron deficiency   . Alcohol abuse   . Depression   . Community acquired pneumonia 01/03/10    2011; with pleural effusion-[facility] acute resp failure  . Obesity   . Community acquired pneumonia 05/2010  . Schizoaffective disorder     requiring multiple psychiatric admissions  . Dysphagia     Past Surgical History  Procedure Date  . Dilation and curettage, diagnostic / therapeutic 1992  .  Esophagogastroduodenoscopy 09/16/08    Dr. Ronni Rumble hiatal hernia/excoriations involving the cardia and mucosa consistent with trauma, antral erosions  of linear petechiae ? gastritis versus early gastric antral vascular  ectasia.Marland Kitchen biopsy showed reactive gastropathy. No H. pylori.  . Esophagogastroduodenoscopy 09/2007    Dr. Rinaldo Ratel ring, dilated to 56 French Maloney dilator, small hiatal hernia, antral erosions, biopsies reactive gastropathy.  Gaspar Bidding dilation 07/17/2011    Fields-distal esophageal stricture s/p dilation, chronic gastritis, multiple ulcers in stomach. no h.pylori    Family History  Problem Relation Age of Onset  . Colon cancer Other   . Hypertension Mother   . Stroke Father     deceased at age 53  . Heart disease Sister   . Anesthesia problems Neg Hx   . Hypotension Neg Hx   . Malignant hyperthermia Neg Hx   . Pseudochol deficiency Neg Hx     History  Substance Use Topics  . Smoking status: Current Some Day Smoker -- 1.5 packs/day for 30 years    Types: Cigarettes  . Smokeless tobacco: Not on file   Comment: Sometimes she smokes more; up to 2 packs/day.  . Alcohol Use: No     occasional; intake has decreased since she found out she has ulcer    OB History    Grav Para Term Preterm Abortions TAB SAB Ect Mult Living                  Review of Systems  Constitutional: Negative for fever and  chills.  Gastrointestinal: Positive for hematochezia and anal bleeding. Negative for nausea, vomiting, abdominal pain, diarrhea, constipation, abdominal distention and rectal pain.  Skin: Negative for pallor.  All other systems reviewed and are negative.    Allergies  Metronidazole; Orange; Shrimp; Penicillins; Sulfonamide derivatives; Glipizide; and Sulfamethoxazole w-trimethoprim  Home Medications   Current Outpatient Rx  Name Route Sig Dispense Refill  . DIPHENHYDRAMINE HCL 25 MG PO TABS Oral Take 50 mg by mouth daily as needed. Sleep/allergies    .  FERROUS GLUCONATE 240 (27 FE) MG PO TABS Oral Take 240 mg by mouth 2 (two) times daily.    Marland Kitchen HYDROCHLOROTHIAZIDE 25 MG PO TABS Oral Take 25 mg by mouth daily.    . INSULIN GLARGINE 100 UNIT/ML [place] SOLN Subcutaneous Inject 28 Units into the skin at bedtime.    Marland Kitchen LORAZEPAM 0.5 MG PO TABS Oral Take 1-2 tablets (0.5-1 mg total) by mouth 2 (two) times daily as needed. For anxiety 90 tablet 0  . LOVASTATIN 20 MG PO TABS Oral Take 1 tablet (20 mg total) by mouth at bedtime. 31 tablet 3  . METFORMIN HCL 1000 MG PO TABS Oral Take 1 tablet (1,000 mg total) by mouth 2 (two) times daily with a meal. 62 tablet 3  . METOPROLOL TARTRATE 50 MG PO TABS Oral Take 1 tablet (50 mg total) by mouth 2 (two) times daily. 62 tablet 11  . PANTOPRAZOLE SODIUM 40 MG PO TBEC Oral Take 40 mg by mouth 2 (two) times daily.    Marland Kitchen POTASSIUM CHLORIDE CRYS ER 20 MEQ PO TBCR Oral Take 1 tablet (20 mEq total) by mouth 2 (two) times daily. 60 tablet 4  . ZIPRASIDONE HCL 40 MG PO CAPS Oral Take 40 mg by mouth daily.     Marland Kitchen ZIPRASIDONE HCL 60 MG PO CAPS Oral Take 60 mg by mouth at bedtime.     . MOMETASONE FUROATE 50 MCG/ACT NA SUSP  2 sprays in each nostril daily 17 g 12    BP 151/78  Pulse 95  Temp 99.2 F (37.3 C) (Oral)  Resp 20  Ht 5\' 4"  (1.626 m)  Wt 190 lb (86.183 kg)  BMI 32.61 kg/m2  SpO2 100%  LMP 12/03/2011  Physical Exam  Nursing note and vitals reviewed. Constitutional: She is oriented to person, place, and time. She appears well-developed and well-nourished. No distress.  HENT:  Head: Normocephalic and atraumatic.  Eyes: EOM are normal.  Neck: Normal range of motion.  Cardiovascular: Normal rate, regular rhythm and normal heart sounds.   Pulmonary/Chest: Effort normal and breath sounds normal.  Abdominal: Soft. Normal appearance. She exhibits no distension and no mass. There is no hepatosplenomegaly. There is no tenderness. There is no rebound, no guarding and no CVA tenderness.       No rectal fissure.  No  hemorrhoids.  No visible blood or stool on the exam finger but it tests trace guiac positive.  Musculoskeletal: Normal range of motion.  Neurological: She is alert and oriented to person, place, and time.  Skin: Skin is warm and dry.  Psychiatric: She has a normal mood and affect. Judgment normal.    ED Course  Procedures (including critical care time)  Labs Reviewed  CBC WITH DIFFERENTIAL - Abnormal; Notable for the following:    Hemoglobin 11.4 (*)     HCT 33.8 (*)     All other components within normal limits  BASIC METABOLIC PANEL - Abnormal; Notable for the following:    Potassium  3.4 (*)     Glucose, Bld 126 (*)     GFR calc non Af Amer 88 (*)     All other components within normal limits   No results found.   1. Rectal bleeding       MDM  Pt advised to f/u with dr. Jena Gauss ASAP.        Evalina Field, [place] 12/18/11 [phone]

## 2011-12-18 NOTE — ED Notes (Signed)
Discharge instructions given and reviewed with patient.  Patient verbalized understanding to follow up with Dr. Jena Gauss for further evaluation.  Patient ambulatory; discharged home in good condition.

## 2011-12-19 ENCOUNTER — Ambulatory Visit (INDEPENDENT_AMBULATORY_CARE_PROVIDER_SITE_OTHER): Payer: Medicare Other | Admitting: Gastroenterology

## 2011-12-19 ENCOUNTER — Encounter: Payer: Self-pay | Admitting: General Practice

## 2011-12-19 ENCOUNTER — Encounter: Payer: Self-pay | Admitting: Gastroenterology

## 2011-12-19 VITALS — BP 119/77 | HR 75 | Temp 98.4°F | Ht 64.0 in | Wt 195.8 lb

## 2011-12-19 DIAGNOSIS — R197 Diarrhea, unspecified: Secondary | ICD-10-CM

## 2011-12-19 DIAGNOSIS — K625 Hemorrhage of anus and rectum: Secondary | ICD-10-CM | POA: Diagnosis not present

## 2011-12-19 DIAGNOSIS — D509 Iron deficiency anemia, unspecified: Secondary | ICD-10-CM

## 2011-12-19 DIAGNOSIS — Z3202 Encounter for pregnancy test, result negative: Secondary | ICD-10-CM | POA: Diagnosis not present

## 2011-12-19 DIAGNOSIS — D649 Anemia, unspecified: Secondary | ICD-10-CM | POA: Diagnosis not present

## 2011-12-19 DIAGNOSIS — N920 Excessive and frequent menstruation with regular cycle: Secondary | ICD-10-CM | POA: Diagnosis not present

## 2011-12-19 MED ORDER — METFORMIN HCL 1000 MG PO TABS: 1000.0000 mg | ORAL_TABLET | Freq: Two times a day (BID) | ORAL | Status: DC

## 2011-12-19 MED ORDER — LOVASTATIN 20 MG PO TABS: 20.0000 mg | ORAL_TABLET | Freq: Every day | ORAL | Status: DC

## 2011-12-19 NOTE — Assessment & Plan Note (Signed)
Chronic, intermittent rectal bleeding. Last colonoscopy in 2007, internal hemorrhoids at that time. She complains of recent moderate volume blood per rectum for which she went to the emergency department 2 days ago. Digital rectal exam by ED physician revealed heme positive stool but no gross blood, fissure, hemorrhoids. We actually tried to schedule patient for colonoscopy in July but she had to cancel for physical and emotional illness. She also complains of intermittent diarrhea. Negative stool studies in June. She has a history of iron deficiency anemia noted last year. Has been on chronic iron supplements. Hemoglobin has dropped 2 g/dL the last 4 weeks. She also notes prolonged menstrual cycle this month. Stating that she has been having regular menses every month until this one. I have encouraged her to call her gynecologist today.  Colonoscopy in the near future with Dr. Jena Gauss. Due to history of polypharmacy and alcohol abuse, requirement for deep sedation the past, we will schedule her again with deep sedation. She will hold her iron for 7 days. Day of bowel prep, she will drop her Lantus to 14 units at bedtime, metformin to 500 mg twice a day.  In the meantime she will call if she has ongoing significant bleeding.

## 2011-12-19 NOTE — Patient Instructions (Addendum)
1. Colonoscopy with Dr. Jena Gauss in the near future. Hold iron supplement for 7 days. Day of bowel prep, decrease Lantus to 14 units at bedtime. Decrease metformin to 500 mg twice a day. 2. Please call with ongoing rectal bleeding. 3. Please call your gynecologist today about abnormal vaginal bleeding. 4. CBC in two weeks (week of 12/31/11).

## 2011-12-19 NOTE — Progress Notes (Signed)
Faxed to PCP

## 2011-12-19 NOTE — Progress Notes (Signed)
Primary Care Physician:  Vernice Jefferson, MD  Primary Gastroenterologist:  Roetta Sessions, MD   Chief Complaint  Patient presents with  . Rectal Bleeding    HPI:  [name] is a 46 y.o. female here for f/u of rectal bleeding. Last seen in 11/06/11. Scheduled her for a colonoscopy at that time but she cancelled. Also has h/o IDA. Seen in ED two days ago with c/o red blood noted on toilet tissue. Her H/H was 11.4/33.8. On DRE, no rectal fissure, hemorrhoids, visible blood but she was heme positive. C/O menstrual flow since 12/03/11. Unusual for her, usually menstrual cycle once per month for four days. Changing pad twice per day. Recent rectal bleeding in toilet and on paper, more than what she has had in the past. Intermittent diarrhea, had some the day in the ED but also had solid stool before that. No abdominal pain/rectal pain. Eating well. No heartburn/swallowing issues. Plans to call her gyn for vaginal bleeding. No melena.   Current Outpatient Prescriptions  Medication Sig Dispense Refill  . diphenhydrAMINE (BENADRYL) 25 MG tablet Take 50 mg by mouth daily as needed. Sleep/allergies      . ferrous gluconate (FERGON) 240 (27 FE) MG tablet Take 240 mg by mouth 2 (two) times daily.      . hydrochlorothiazide (HYDRODIURIL) 25 MG tablet Take 25 mg by mouth daily.      . insulin glargine (LANTUS) 100 UNIT/ML injection Inject 28 Units into the skin at bedtime.      Marland Kitchen LORazepam (ATIVAN) 0.5 MG tablet Take 1-2 tablets (0.5-1 mg total) by mouth 2 (two) times daily as needed. For anxiety  90 tablet  0  . lovastatin (MEVACOR) 20 MG tablet Take 1 tablet (20 mg total) by mouth at bedtime.  31 tablet  3  . metFORMIN (GLUCOPHAGE) 1000 MG tablet Take 1 tablet (1,000 mg total) by mouth 2 (two) times daily with a meal.  62 tablet  3  . metoprolol (LOPRESSOR) 50 MG tablet Take 1 tablet (50 mg total) by mouth 2 (two) times daily.  62 tablet  11  . mometasone (NASONEX) 50 MCG/ACT nasal spray 2 sprays in  each nostril daily  17 g  12  . pantoprazole (PROTONIX) 40 MG tablet Take 40 mg by mouth 2 (two) times daily.      . potassium chloride SA (K-DUR,KLOR-CON) 20 MEQ tablet Take 1 tablet (20 mEq total) by mouth 2 (two) times daily.  60 tablet  4  . ziprasidone (GEODON) 40 MG capsule Take 40 mg by mouth daily.       . ziprasidone (GEODON) 60 MG capsule Take 60 mg by mouth at bedtime.         Allergies as of 12/19/2011 - Review Complete 12/19/2011  Allergen Reaction Noted  . Metronidazole Shortness Of Breath and Swelling   . Orange Itching 03/27/2011  . Shrimp (shellfish allergy) Shortness Of Breath and Itching 03/27/2011  . Penicillins Hives and Swelling   . Sulfonamide derivatives Hives   . Glipizide Other (See Comments) 01/15/2011  . Sulfamethoxazole w-trimethoprim Rash 02/27/2010    Past Medical History  Diagnosis Date  . Arteriosclerotic cardiovascular disease (ASCVD)     Minimal at cath in [facility].stress nuclear study in 8/08 with nl EF; neg stress echo in 2010  . Diabetes mellitus, type 2 2000    Onset in 2000; no insulin  . Hyperlipidemia   . Hypertension `    during treatment with Geodon  . Gastroesophageal reflux  disease     Schatzki's ring  . Anemia, iron deficiency   . Alcohol abuse   . Depression   . Community acquired pneumonia 01/03/10    2011; with pleural effusion-[facility] acute resp failure  . Obesity   . Community acquired pneumonia 05/2010  . Schizoaffective disorder     requiring multiple psychiatric admissions  . Dysphagia     Past Surgical History  Procedure Date  . Dilation and curettage, diagnostic / therapeutic 1992  . Esophagogastroduodenoscopy 09/16/08    Dr. Ronni Rumble hiatal hernia/excoriations involving the cardia and mucosa consistent with trauma, antral erosions  of linear petechiae ? gastritis versus early gastric antral vascular  ectasia.Marland Kitchen biopsy showed reactive gastropathy. No H. pylori.  . Esophagogastroduodenoscopy 09/2007      Dr. Rinaldo Ratel ring, dilated to 56 French Maloney dilator, small hiatal hernia, antral erosions, biopsies reactive gastropathy.  Gaspar Bidding dilation 07/17/2011    [facility] sedation-->distal esophageal stricture s/p dilation, chronic gastritis, multiple ulcers in stomach. no h.pylori  . Colonoscopy 01/2006    internal hemorrhoids    Family History  Problem Relation Age of Onset  . Colon cancer Other   . Hypertension Mother   . Stroke Father     deceased at age 43  . Heart disease Sister   . Anesthesia problems Neg Hx   . Hypotension Neg Hx   . Malignant hyperthermia Neg Hx   . Pseudochol deficiency Neg Hx     History   Social History  . Marital Status: Single    Spouse Name: N/A    Number of Children: N/A  . Years of Education: 12   Occupational History  . UNEMPLOYED   . UNEMPLOYED    Social History Main Topics  . Smoking status: Current Some Day Smoker -- 1.5 packs/day for 30 years    Types: Cigarettes  . Smokeless tobacco: Not on file   Comment: Sometimes she smokes more; up to 2 packs/day., currently trying to quit (September 2013).  . Alcohol Use: No     occasional; intake has decreased since she found out she has ulcer, no significant alcohol since April 2013.  . Drug Use: No  . Sexually Active: No   Other Topics Concern  . 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 Complains of feeling tired. Eyes: Negative for vision changes.  ENT: Negative for hoarseness, difficulty swallowing , nasal congestion. CV: Negative for chest pain, angina, palpitations, dyspnea on exertion, peripheral edema.  Respiratory: Negative for dyspnea at rest, dyspnea  on exertion, cough, sputum, wheezing.  GI: See history of present illness. GU:  Negative for dysuria, hematuria, urinary incontinence, urinary frequency, nocturnal urination.  MS: Negative for joint pain, low back pain.  Derm: Negative for rash or itching.  Neuro: Negative for weakness, abnormal sensation, seizure, frequent headaches, memory loss, confusion.  Psych: chronic anxiety, depression, but no suicidal ideation, hallucinations.  Endo: Negative for unusual weight change.  Heme: Negative for bruising or bleeding. Allergy: Negative for rash or hives.    Physical Examination:  BP 119/77  Pulse 75  Temp 98.4 F (36.9 C) (Temporal)  Ht 5\' 4"  (1.626 m)  Wt 195 lb 12.8  oz (88.814 kg)  BMI 33.61 kg/m2  LMP 12/03/2011   General: Well-nourished, well-developed in no acute distress.  Head: Normocephalic, atraumatic.   Eyes: Conjunctiva pink, no icterus. Mouth: Oropharyngeal mucosa moist and pink , no lesions erythema or exudate. Neck: Supple without thyromegaly, masses, or lymphadenopathy.  Lungs: Clear to auscultation bilaterally.  Heart: Regular rate and rhythm, no murmurs rubs or gallops.  Abdomen: Bowel sounds are normal, nontender, nondistended, no hepatosplenomegaly or masses, no abdominal bruits or    hernia , no rebound or guarding.   Rectal: done in the ED, deferred to time of colonoscopy Extremities: No lower extremity edema. No clubbing or deformities.  Neuro: Alert and oriented x 4 , grossly normal neurologically.  Skin: Warm and dry, no rash or jaundice.   Psych: Alert and cooperative, normal mood and affect.  Labs: Lab Results  Component Value Date   WBC 8.8 12/17/2011   HGB 11.4* 12/17/2011   HCT 33.8* 12/17/2011   MCV 84.9 12/17/2011   PLT 206 12/17/2011    H/H 13.2/38.7 on 11/15/11.   Imaging Studies: No results found.

## 2011-12-25 ENCOUNTER — Emergency Department (HOSPITAL_COMMUNITY)
Admission: EM | Admit: 2011-12-25 | Discharge: 2011-12-26 | Disposition: A | Discharge status: Home / Self Care | Payer: Medicare Other | Attending: Emergency Medicine | Admitting: Emergency Medicine

## 2011-12-25 ENCOUNTER — Encounter (HOSPITAL_COMMUNITY): Payer: Self-pay | Admitting: *Deleted

## 2011-12-25 ENCOUNTER — Other Ambulatory Visit: Payer: Self-pay

## 2011-12-25 DIAGNOSIS — Z880 Allergy status to penicillin: Secondary | ICD-10-CM | POA: Diagnosis not present

## 2011-12-25 DIAGNOSIS — N898 Other specified noninflammatory disorders of vagina: Secondary | ICD-10-CM | POA: Diagnosis not present

## 2011-12-25 DIAGNOSIS — I2510 Atherosclerotic heart disease of native coronary artery without angina pectoris: Secondary | ICD-10-CM | POA: Diagnosis not present

## 2011-12-25 DIAGNOSIS — E119 Type 2 diabetes mellitus without complications: Secondary | ICD-10-CM | POA: Diagnosis not present

## 2011-12-25 DIAGNOSIS — F17200 Nicotine dependence, unspecified, uncomplicated: Secondary | ICD-10-CM | POA: Insufficient documentation

## 2011-12-25 DIAGNOSIS — Z882 Allergy status to sulfonamides status: Secondary | ICD-10-CM | POA: Insufficient documentation

## 2011-12-25 DIAGNOSIS — K219 Gastro-esophageal reflux disease without esophagitis: Secondary | ICD-10-CM | POA: Insufficient documentation

## 2011-12-25 DIAGNOSIS — Z91013 Allergy to seafood: Secondary | ICD-10-CM | POA: Insufficient documentation

## 2011-12-25 DIAGNOSIS — F259 Schizoaffective disorder, unspecified: Secondary | ICD-10-CM | POA: Insufficient documentation

## 2011-12-25 DIAGNOSIS — I10 Essential (primary) hypertension: Secondary | ICD-10-CM | POA: Insufficient documentation

## 2011-12-25 DIAGNOSIS — K625 Hemorrhage of anus and rectum: Secondary | ICD-10-CM | POA: Diagnosis not present

## 2011-12-25 DIAGNOSIS — E785 Hyperlipidemia, unspecified: Secondary | ICD-10-CM | POA: Diagnosis not present

## 2011-12-25 DIAGNOSIS — R42 Dizziness and giddiness: Secondary | ICD-10-CM | POA: Diagnosis not present

## 2011-12-25 LAB — CBC WITH DIFFERENTIAL/PLATELET
Basophils Absolute: 0 10*3/uL (ref 0.0–0.1)
Eosinophils Absolute: 0.2 10*3/uL (ref 0.0–0.7)
Eosinophils Relative: 2 % (ref 0–5)
Lymphs Abs: 3 10*3/uL (ref 0.7–4.0)
MCH: 28.6 pg (ref 26.0–34.0)
MCV: 83.7 fL (ref 78.0–100.0)
Platelets: 251 10*3/uL (ref 150–400)
RDW: 15 % (ref 11.5–15.5)

## 2011-12-25 LAB — BASIC METABOLIC PANEL
Calcium: 9.6 mg/dL (ref 8.4–10.5)
GFR calc non Af Amer: >90 mL/min (ref >90)
Glucose, Bld: 164 mg/dL — ABNORMAL HIGH (ref 70–99)
Sodium: 133 mEq/L — ABNORMAL LOW (ref 135–145)

## 2011-12-25 LAB — GLUCOSE, CAPILLARY: Glucose-Capillary: 166 mg/dL — ABNORMAL HIGH (ref 70–99)

## 2011-12-25 LAB — SAMPLE TO BLOOD BANK

## 2011-12-25 MED ORDER — SODIUM CHLORIDE 0.9 % IV SOLN
1000.0000 mL | INTRAVENOUS | Status: DC
  Administered 2011-12-25: 1000 mL via INTRAVENOUS

## 2011-12-25 MED ORDER — SODIUM CHLORIDE 0.9 % IV SOLN
1000.0000 mL | Freq: Once | INTRAVENOUS | Status: AC
  Administered 2011-12-25: 1000 mL via INTRAVENOUS

## 2011-12-25 NOTE — ED Notes (Addendum)
Pt reporting dizziness began today.   Reports rectal and vaginal bleeding x22 days.  Reports she does have appointment with [facility] tomorrow.  Also reports colonscopy scheduled for 25th or 26th. Pt reports that she would prefer to receive blood substitute if she does require a transfusion.

## 2011-12-25 NOTE — ED Notes (Signed)
Pt c/o nausea and dizziness. Pt c/o SOB occasionally.

## 2011-12-25 NOTE — ED Provider Notes (Signed)
History     CSN: [id number]  Arrival date & time 12/25/11  2106   First MD Initiated Contact with Patient 12/25/11 2118      No chief complaint on file.   (Consider location/radiation/quality/duration/timing/severity/associated sxs/prior treatment) HPI Comments: Patient presents to the emergency department with complaint of dizziness. Patient states that she has been having problems with rectal bleeding for approximately 22 days. She's also had some problems with vaginal bleeding but not quite that long. The patient states that when she changes positions she has dizziness in which she feels like she is spinning and also sensation of falling. The patient denies palpitations or chest pain. She's not had any recent changes in medications. She's not had any changes in her diet. Patient states she's been keeping up with her liquids.  Patient states she was recently seen by GI specialist and was told that she would be scheduled for a colonoscopy and that in the meantime she was to be on iron supplements and increase fluids. The patient has also been seen by GYN physician and placed on Megace but she states this is not helping with her leading. She is scheduled for an ultrasound but this has not been scheduled yet.  The history is provided by the patient.    Past Medical History  Diagnosis Date  . Arteriosclerotic cardiovascular disease (ASCVD)     Minimal at cath in [facility].stress nuclear study in 8/08 with nl EF; neg stress echo in 2010  . Diabetes mellitus, type 2 2000    Onset in 2000; no insulin  . Hyperlipidemia   . Hypertension `    during treatment with Geodon  . Gastroesophageal reflux disease     Schatzki's ring  . Anemia, iron deficiency   . Alcohol abuse   . Depression   . Community acquired pneumonia 01/03/10    2011; with pleural effusion[facility] acute resp failure  . Obesity   . Community acquired pneumonia 05/2010  . Schizoaffective disorder    requiring multiple psychiatric admissions  . Dysphagia     Past Surgical History  Procedure Date  . Dilation and curettage, diagnostic / therapeutic 1992  . Esophagogastroduodenoscopy 09/16/08    Dr. Ronni Rumble hiatal hernia/excoriations involving the cardia and mucosa consistent with trauma, antral erosions  of linear petechiae ? gastritis versus early gastric antral vascular  ectasia.Marland Kitchen biopsy showed reactive gastropathy. No H. pylori.  . Esophagogastroduodenoscopy 09/2007    Dr. Rinaldo Ratel ring, dilated to 56 French Maloney dilator, small hiatal hernia, antral erosions, biopsies reactive gastropathy.  Gaspar Bidding dilation 07/17/2011    [facility] sedation-->distal esophageal stricture s/p dilation, chronic gastritis, multiple ulcers in stomach. no h.pylori  . Colonoscopy 01/2006    internal hemorrhoids    Family History  Problem Relation Age of Onset  . Colon cancer Other   . Hypertension Mother   . Stroke Father     deceased at age 27  . Heart disease Sister   . Anesthesia problems Neg Hx   . Hypotension Neg Hx   . Malignant hyperthermia Neg Hx   . Pseudochol deficiency Neg Hx     History  Substance Use Topics  . Smoking status: Current Some Day Smoker -- 1.5 packs/day for 30 years    Types: Cigarettes  . Smokeless tobacco: Not on file   Comment: Sometimes she smokes more; up to 2 packs/day., currently trying to quit (September 2013).  . Alcohol Use: No     occasional; intake has decreased since she  found out she has ulcer, no significant alcohol since April 2013.    OB History    Grav Para Term Preterm Abortions TAB SAB Ect Mult Living                  Review of Systems  Constitutional: Negative for activity change.       All ROS Neg except as noted in HPI  HENT: Negative for nosebleeds and neck pain.   Eyes: Negative for photophobia and discharge.  Respiratory: Negative for cough, shortness of breath and wheezing.   Cardiovascular: Negative for chest pain and  palpitations.  Gastrointestinal: Negative for abdominal pain and blood in stool.  Genitourinary: Positive for vaginal bleeding and menstrual problem. Negative for dysuria, frequency and hematuria.  Musculoskeletal: Negative for back pain and arthralgias.  Skin: Negative.   Neurological: Positive for dizziness and weakness. Negative for seizures and speech difficulty.  Psychiatric/Behavioral: Negative for hallucinations and confusion.       Schizoaffective disorder    Allergies  Metronidazole; Orange; Shrimp; Penicillins; Sulfonamide derivatives; Glipizide; and Sulfamethoxazole w-trimethoprim  Home Medications   Current Outpatient Rx  Name Route Sig Dispense Refill  . FERROUS GLUCONATE 240 (27 FE) MG PO TABS Oral Take 240 mg by mouth 2 (two) times daily.    Marland Kitchen HYDROCHLOROTHIAZIDE 25 MG PO TABS Oral Take 25 mg by mouth daily.    . INSULIN GLARGINE 100 UNIT/ML [place] SOLN Subcutaneous Inject 28 Units into the skin at bedtime.    Marland Kitchen LORAZEPAM 0.5 MG PO TABS Oral Take 1-2 tablets (0.5-1 mg total) by mouth 2 (two) times daily as needed. For anxiety 90 tablet 0  . LOVASTATIN 20 MG PO TABS Oral Take 1 tablet (20 mg total) by mouth at bedtime. 31 tablet 3  . MEGESTROL ACETATE 20 MG PO TABS Oral Take 20 mg by mouth daily.    Marland Kitchen METFORMIN HCL 1000 MG PO TABS Oral Take 1 tablet (1,000 mg total) by mouth 2 (two) times daily with a meal. 62 tablet 3  . METOPROLOL TARTRATE 50 MG PO TABS Oral Take 1 tablet (50 mg total) by mouth 2 (two) times daily. 62 tablet 11  . PANTOPRAZOLE SODIUM 40 MG PO TBEC Oral Take 40 mg by mouth 2 (two) times daily.    Marland Kitchen POTASSIUM CHLORIDE CRYS ER 20 MEQ PO TBCR Oral Take 1 tablet (20 mEq total) by mouth 2 (two) times daily. 60 tablet 4  . ZIPRASIDONE HCL 40 MG PO CAPS Oral Take 40 mg by mouth daily.     Marland Kitchen ZIPRASIDONE HCL 60 MG PO CAPS Oral Take 60 mg by mouth at bedtime.     Marland Kitchen DIPHENHYDRAMINE HCL 25 MG PO TABS Oral Take 50 mg by mouth daily as needed. Sleep/allergies    .  MOMETASONE FUROATE 50 MCG/ACT NA SUSP  2 sprays in each nostril daily 17 g 12    BP 144/93  Pulse 103  Temp 98.9 F (37.2 C) (Oral)  Resp 16  Ht 5\' 4"  (1.626 m)  Wt 198 lb (89.812 kg)  BMI 33.99 kg/m2  SpO2 100%  LMP 12/03/2011  Physical Exam  Nursing note and vitals reviewed. Constitutional: She is oriented to person, place, and time. She appears well-developed and well-nourished.  Non-toxic appearance.  HENT:  Head: Normocephalic.  Right Ear: Tympanic membrane and external ear normal.  Left Ear: Tympanic membrane and external ear normal.  Eyes: EOM and lids are normal. Pupils are equal, round, and reactive to light.  Neck:  Normal range of motion. Neck supple. Carotid bruit is not present.  Cardiovascular: Regular rhythm, normal heart sounds, intact distal pulses and normal pulses.  Tachycardia present.   Pulmonary/Chest: Breath sounds normal. No respiratory distress.  Abdominal: Soft. Bowel sounds are normal. There is no tenderness. There is no guarding.  Genitourinary:       Rectal examination reveals no fissures or abscess. There is good sphincter tone noted. Stool is positive for a cold blood. There is no blood seen on my gloved finger. Chaperone present during the examination.  Musculoskeletal: Normal range of motion.  Lymphadenopathy:       Head (right side): No submandibular adenopathy present.       Head (left side): No submandibular adenopathy present.    She has no cervical adenopathy.  Neurological: She is alert and oriented to person, place, and time. She has normal strength. No cranial nerve deficit or sensory deficit.  Skin: Skin is warm and dry.  Psychiatric: She has a normal mood and affect. Her speech is normal.    ED Course  Procedures (including critical care time)  Labs Reviewed  GLUCOSE, CAPILLARY - Abnormal; Notable for the following:    Glucose-Capillary 166 (*)     All other components within normal limits  CBC WITH DIFFERENTIAL  BASIC METABOLIC  PANEL  SAMPLE TO BLOOD BANK   No results found.   No diagnosis found.    MDM  I have reviewed nursing notes, vital signs, and all appropriate lab and imaging results for this patient. The basic metabolic panel use the sodium to be slightly low at 133 glucose to be elevated at 164. The complete blood count is within normal limits. Patient has history of rectal bleeding as well as vaginal bleeding. She is currently being seen by GI and OB/GYN. The patient's orthostatic vital signs are within normal limits. The labs are negative for acute changes. The patient was amateur without problems. The dizziness seemed to have improved after the patient received IV fluids in the emergency department. It is safe for the patient to be discharged home. She has an appointment with her GYN physician on tomorrow. Patient encouraged to return if any changes, problems, or concerns.       Kathie Dike, [place] 12/27/11 2059

## 2011-12-26 DIAGNOSIS — N920 Excessive and frequent menstruation with regular cycle: Secondary | ICD-10-CM | POA: Diagnosis not present

## 2011-12-26 DIAGNOSIS — R9389 Abnormal findings on diagnostic imaging of other specified body structures: Secondary | ICD-10-CM | POA: Diagnosis not present

## 2011-12-26 NOTE — ED Notes (Signed)
Pt discharged.  Verbalized understanding of discharge instructions.  Unable to obtain electronic signature due to downtime.

## 2011-12-27 DIAGNOSIS — F319 Bipolar disorder, unspecified: Secondary | ICD-10-CM | POA: Diagnosis not present

## 2011-12-28 NOTE — Patient Instructions (Signed)
20 [name]  12/28/2011   Your procedure is scheduled on:  01/10/2012  Report to [facility] at 0700 AM.  Call this number if you have problems the morning of surgery: [phone]   Remember:   Do not eat food:After Midnight.  May have clear liquids:until Midnight .  Clear liquids include soda, tea, black coffee, apple or grape juice, broth.  Take these medicines the morning of surgery with A SIP OF WATER: ativan, protonix, geodon   Do not wear jewelry, make-up or nail polish.  Do not wear lotions, powders, or perfumes. You may wear deodorant.  Do not shave 48 hours prior to surgery. Men may shave face and neck.  Do not bring valuables to the hospital.  Contacts, dentures or bridgework may not be worn into surgery.  Leave suitcase in the car. After surgery it may be brought to your room.  For patients admitted to the hospital, checkout time is 11:00 AM the day of discharge.   Patients discharged the day of surgery will not be allowed to drive home.  Name and phone number of your driver: family  Special Instructions: N/A   Please read over the following fact sheets that you were given: Pain Booklet, MRSA Information, Surgical Site Infection Prevention, Anesthesia Post-op Instructions and Care and Recovery After Surgery   PATIENT INSTRUCTIONS POST-ANESTHESIA  IMMEDIATELY FOLLOWING SURGERY:  Do not drive or operate machinery for the first twenty four hours after surgery.  Do not make any important decisions for twenty four hours after surgery or while taking narcotic pain medications or sedatives.  If you develop intractable nausea and vomiting or a severe headache please notify your doctor immediately.  FOLLOW-UP:  Please make an appointment with your surgeon as instructed. You do not need to follow up with anesthesia unless specifically instructed to do so.  WOUND CARE INSTRUCTIONS (if applicable):  Keep a dry clean dressing on the anesthesia/puncture wound site if there is  drainage.  Once the wound has quit draining you may leave it open to air.  Generally you should leave the bandage intact for twenty four hours unless there is drainage.  If the epidural site drains for more than 36-48 hours please call the anesthesia department.  QUESTIONS?:  Please feel free to call your physician or the hospital operator if you have any questions, and they will be happy to assist you.      Colonoscopy A colonoscopy is an exam to evaluate your entire colon. In this exam, your colon is cleansed. A long fiberoptic tube is inserted through your rectum and into your colon. The fiberoptic scope (endoscope) is a long bundle of enclosed and very flexible fibers. These fibers transmit light to the area examined and send images from that area to your caregiver. Discomfort is usually minimal. You may be given a drug to help you sleep (sedative) during or prior to the procedure. This exam helps to detect lumps (tumors), polyps, inflammation, and areas of bleeding. Your caregiver may also take a small piece of tissue (biopsy) that will be examined under a microscope. LET YOUR CAREGIVER KNOW ABOUT:   Allergies to food or medicine.   Medicines taken, including vitamins, herbs, eyedrops, over-the-counter medicines, and creams.   Use of steroids (by mouth or creams).   Previous problems with anesthetics or numbing medicines.   History of bleeding problems or blood clots.   Previous surgery.   Other health problems, including diabetes and kidney problems.   Possibility of pregnancy,  if this applies.  BEFORE THE PROCEDURE   A clear liquid diet may be required for 2 days before the exam.   Ask your caregiver about changing or stopping your regular medications.   Liquid injections (enemas) or laxatives may be required.   A large amount of electrolyte solution may be given to you to drink over a short period of time. This solution is used to clean out your colon.   You should be  present 60 minutes prior to your procedure or as directed by your caregiver.  AFTER THE PROCEDURE   If you received a sedative or pain relieving medication, you will need to arrange for someone to drive you home.   Occasionally, there is a little blood passed with the first bowel movement. Do not be concerned.  FINDING OUT THE RESULTS OF YOUR TEST Not all test results are available during your visit. If your test results are not back during the visit, make an appointment with your caregiver to find out the results. Do not assume everything is normal if you have not heard from your caregiver or the medical facility. It is important for you to follow up on all of your test results. HOME CARE INSTRUCTIONS   It is not unusual to pass moderate amounts of gas and experience mild abdominal cramping following the procedure. This is due to air being used to inflate your colon during the exam. Walking or a warm pack on your belly (abdomen) may help.   You may resume all normal meals and activities after sedatives and medicines have worn off.   Only take over-the-counter or prescription medicines for pain, discomfort, or fever as directed by your caregiver. Do not use aspirin or blood thinners if a biopsy was taken. Consult your caregiver for medicine usage if biopsies were taken.  SEEK IMMEDIATE MEDICAL CARE IF:   You have a fever.   You pass large blood clots or fill a toilet with blood following the procedure. This may also occur 10 to 14 days following the procedure. This is more likely if a biopsy was taken.   You develop abdominal pain that keeps getting worse and cannot be relieved with medicine.  Document Released: 03/30/2000 Document Revised: 03/22/2011 Document Reviewed: 11/13/2007 [facility] Patient Information 2012 [place], [place].

## 2011-12-28 NOTE — ED Provider Notes (Signed)
Medical screening examination/treatment/procedure(s) were performed by non-physician practitioner and as supervising physician I was immediately available for consultation/collaboration.  Raeford Razor, MD 12/28/11 [phone]

## 2011-12-31 ENCOUNTER — Encounter (HOSPITAL_COMMUNITY): Admission: RE | Admit: 2011-12-31 | Discharge: 2011-12-31 | Payer: Medicare Other | Source: Ambulatory Visit

## 2012-01-02 ENCOUNTER — Other Ambulatory Visit: Payer: Self-pay | Admitting: Obstetrics and Gynecology

## 2012-01-02 DIAGNOSIS — Z3202 Encounter for pregnancy test, result negative: Secondary | ICD-10-CM | POA: Diagnosis not present

## 2012-01-02 DIAGNOSIS — N920 Excessive and frequent menstruation with regular cycle: Secondary | ICD-10-CM | POA: Diagnosis not present

## 2012-01-03 ENCOUNTER — Encounter (HOSPITAL_COMMUNITY): Payer: Self-pay | Admitting: Pharmacy Technician

## 2012-01-03 ENCOUNTER — Encounter (HOSPITAL_COMMUNITY)
Admission: RE | Admit: 2012-01-03 | Discharge: 2012-01-03 | Disposition: A | Discharge status: Home / Self Care | Payer: Medicare Other | Source: Ambulatory Visit | Attending: Internal Medicine | Admitting: Internal Medicine

## 2012-01-03 ENCOUNTER — Encounter (HOSPITAL_COMMUNITY): Payer: Self-pay

## 2012-01-03 NOTE — Patient Instructions (Signed)
20 TAELYR JANTZ  01/03/2012   Your procedure is scheduled on:  01/10/12  Report to [facility] at 07:00 AM.  Call this number if you have problems the morning of surgery: [phone]   Remember:   Do not eat or drink:As directed by your doctor.  Take these medicines the morning of surgery with A SIP OF WATER: Metoprolol, Geodon, Pantoprazole and Ativan if needed. Also, use your Nasonex and bring it with you to the   hospital. The night before your procedure, only take half of your dose of Lantus the night before your procedure.   Do not wear jewelry, make-up or nail polish.  Do not wear lotions, powders, or perfumes. You may wear deodorant.  Do not shave 48 hours prior to surgery. Men may shave face and neck.  Do not bring valuables to the hospital.  Contacts, dentures or bridgework may not be worn into surgery.  Leave suitcase in the car. After surgery it may be brought to your room.  For patients admitted to the hospital, checkout time is 11:00 AM the day of discharge.   Patients discharged the day of surgery will not be allowed to drive home.  Name and phone number of your driver:  Special Instructions: Use your bowel prep as directed by your doctor.   Please read over the following fact sheets that you were given: Pain Booklet, Anesthesia Post-op Instructions and Care and Recovery After Surgery    Colonoscopy A colonoscopy is an exam to evaluate your entire colon. In this exam, your colon is cleansed. A long fiberoptic tube is inserted through your rectum and into your colon. The fiberoptic scope (endoscope) is a long bundle of enclosed and very flexible fibers. These fibers transmit light to the area examined and send images from that area to your caregiver. Discomfort is usually minimal. You may be given a drug to help you sleep (sedative) during or prior to the procedure. This exam helps to detect lumps (tumors), polyps, inflammation, and areas of bleeding. Your caregiver may also  take a small piece of tissue (biopsy) that will be examined under a microscope. LET YOUR CAREGIVER KNOW ABOUT:   Allergies to food or medicine.   Medicines taken, including vitamins, herbs, eyedrops, over-the-counter medicines, and creams.   Use of steroids (by mouth or creams).   Previous problems with anesthetics or numbing medicines.   History of bleeding problems or blood clots.   Previous surgery.   Other health problems, including diabetes and kidney problems.   Possibility of pregnancy, if this applies.  BEFORE THE PROCEDURE   A clear liquid diet may be required for 2 days before the exam.   Ask your caregiver about changing or stopping your regular medications.   Liquid injections (enemas) or laxatives may be required.   A large amount of electrolyte solution may be given to you to drink over a short period of time. This solution is used to clean out your colon.   You should be present 60 minutes prior to your procedure or as directed by your caregiver.  AFTER THE PROCEDURE   If you received a sedative or pain relieving medication, you will need to arrange for someone to drive you home.   Occasionally, there is a little blood passed with the first bowel movement. Do not be concerned.  FINDING OUT THE RESULTS OF YOUR TEST Not all test results are available during your visit. If your test results are not back during the visit, make  an appointment with your caregiver to find out the results. Do not assume everything is normal if you have not heard from your caregiver or the medical facility. It is important for you to follow up on all of your test results. HOME CARE INSTRUCTIONS   It is not unusual to pass moderate amounts of gas and experience mild abdominal cramping following the procedure. This is due to air being used to inflate your colon during the exam. Walking or a warm pack on your belly (abdomen) may help.   You may resume all normal meals and activities after  sedatives and medicines have worn off.   Only take over-the-counter or prescription medicines for pain, discomfort, or fever as directed by your caregiver. Do not use aspirin or blood thinners if a biopsy was taken. Consult your caregiver for medicine usage if biopsies were taken.  SEEK IMMEDIATE MEDICAL CARE IF:   You have a fever.   You pass large blood clots or fill a toilet with blood following the procedure. This may also occur 10 to 14 days following the procedure. This is more likely if a biopsy was taken.   You develop abdominal pain that keeps getting worse and cannot be relieved with medicine.  Document Released: 03/30/2000 Document Revised: 03/22/2011 Document Reviewed: 11/13/2007 [facility] Patient Information 2012 [place], [place].   PATIENT INSTRUCTIONS POST-ANESTHESIA  IMMEDIATELY FOLLOWING SURGERY:  Do not drive or operate machinery for the first twenty four hours after surgery.  Do not make any important decisions for twenty four hours after surgery or while taking narcotic pain medications or sedatives.  If you develop intractable nausea and vomiting or a severe headache please notify your doctor immediately.  FOLLOW-UP:  Please make an appointment with your surgeon as instructed. You do not need to follow up with anesthesia unless specifically instructed to do so.  WOUND CARE INSTRUCTIONS (if applicable):  Keep a dry clean dressing on the anesthesia/puncture wound site if there is drainage.  Once the wound has quit draining you may leave it open to air.  Generally you should leave the bandage intact for twenty four hours unless there is drainage.  If the epidural site drains for more than 36-48 hours please call the anesthesia department.  QUESTIONS?:  Please feel free to call your physician or the hospital operator if you have any questions, and they will be happy to assist you.

## 2012-01-04 ENCOUNTER — Telehealth: Payer: Self-pay | Admitting: *Deleted

## 2012-01-04 ENCOUNTER — Encounter: Payer: Self-pay | Admitting: Internal Medicine

## 2012-01-04 ENCOUNTER — Ambulatory Visit (INDEPENDENT_AMBULATORY_CARE_PROVIDER_SITE_OTHER): Payer: Medicare Other | Admitting: Internal Medicine

## 2012-01-04 VITALS — BP 135/84 | HR 103 | Temp 98.2°F | Ht 64.0 in | Wt 187.5 lb

## 2012-01-04 DIAGNOSIS — E785 Hyperlipidemia, unspecified: Secondary | ICD-10-CM

## 2012-01-04 DIAGNOSIS — I10 Essential (primary) hypertension: Secondary | ICD-10-CM | POA: Diagnosis not present

## 2012-01-04 DIAGNOSIS — E119 Type 2 diabetes mellitus without complications: Secondary | ICD-10-CM

## 2012-01-04 DIAGNOSIS — K625 Hemorrhage of anus and rectum: Secondary | ICD-10-CM | POA: Diagnosis not present

## 2012-01-04 DIAGNOSIS — F329 Major depressive disorder, single episode, unspecified: Secondary | ICD-10-CM | POA: Diagnosis not present

## 2012-01-04 DIAGNOSIS — F3289 Other specified depressive episodes: Secondary | ICD-10-CM

## 2012-01-04 MED ORDER — INSULIN GLARGINE 100 UNIT/ML ~~LOC~~ SOLN: 10.0000 [IU] | Freq: Every day | SUBCUTANEOUS | Status: DC

## 2012-01-04 MED ORDER — HYDROCHLOROTHIAZIDE 25 MG PO TABS: 25.0000 mg | ORAL_TABLET | Freq: Every day | ORAL | Status: DC

## 2012-01-04 MED ORDER — TRAZODONE HCL 100 MG PO TABS: 100.0000 mg | ORAL_TABLET | Freq: Every day | ORAL | Status: DC

## 2012-01-04 MED ORDER — LOVASTATIN 20 MG PO TABS: 20.0000 mg | ORAL_TABLET | Freq: Every day | ORAL | Status: DC

## 2012-01-04 NOTE — Patient Instructions (Signed)
1.   Stop in the lab to have your blood drawn.  If we need to follow up on anything I will call you.  2.  Make sure you keep your colonoscopy appointment to try to track down the blood you are seeing.    3.  Continue your medications as prescribed.  4.  Follow up in 3 months or sooner if the fatigue is not getting better.

## 2012-01-04 NOTE — Telephone Encounter (Signed)
Pt calls and states she has lost 12 lbs in 1 1/2 weeks, has "low grade" fever, nausea and intermittent diarrhea, states she has had some "bleeding in my bowels" and "something wrong with my uterus" denies vomiting, states she is getting weak and will have someone bring for appt. appt 9/20 @1445  dr Tonny Branch per chilonb.

## 2012-01-04 NOTE — Progress Notes (Signed)
Subjective:   Patient ID: [name] female   DOB: 1966-04-09 46 y.o.   MRN: [id number]  HPI: Ms.Zahriyah R Retana is a 46 y.o. woman who presents to clinic today for follow up on her chronic medical problems including diabetes, hypertension, hyperlipidemia, and depression. See Problem focused Assessment and Plan for full details of her chronic medical conditions.    She continues to follow with gastroenterology for blood in her stool.  She is scheduled for a colonoscopy in 6 days.  She states that she has continued to have intermittent nausea, fever to 99 degrees F, and continued blood in her stool though she states that it has lessened lately.  She is concerned about her fatigue and weight loss but after reviewing her chart she has actually gained 1 lb since her last visit.  She has lost about 15 lbs over the month of August but I would be concerned about inaccurate weights.    Past Medical History  Diagnosis Date  . Arteriosclerotic cardiovascular disease (ASCVD)     Minimal at cath in [facility].stress nuclear study in 8/08 with nl EF; neg stress echo in 2010  . Diabetes mellitus, type 2 2000    Onset in 2000; no insulin  . Hyperlipidemia   . Hypertension `    during treatment with Geodon  . Gastroesophageal reflux disease     Schatzki's ring  . Anemia, iron deficiency   . Alcohol abuse   . Depression   . Community acquired pneumonia 01/03/10    2011; with pleural effusion-[facility] acute resp failure  . Obesity   . Community acquired pneumonia 05/2010  . Schizoaffective disorder     requiring multiple psychiatric admissions  . Dysphagia    Current Outpatient Prescriptions  Medication Sig Dispense Refill  . diphenhydrAMINE (BENADRYL) 25 MG tablet Take 50 mg by mouth daily as needed. Sleep/allergies      . ferrous gluconate (FERGON) 240 (27 FE) MG tablet Take 240 mg by mouth 2 (two) times daily.      . hydrochlorothiazide (HYDRODIURIL) 25 MG tablet Take 25 mg  by mouth daily.      . insulin glargine (LANTUS) 100 UNIT/ML injection Inject 10-24 Units into the skin at bedtime. Checks sugar before bedtime, dose depends on results      . LORazepam (ATIVAN) 0.5 MG tablet Take 1-2 tablets (0.5-1 mg total) by mouth 2 (two) times daily as needed. For anxiety  90 tablet  0  . lovastatin (MEVACOR) 20 MG tablet Take 1 tablet (20 mg total) by mouth at bedtime.  31 tablet  3  . metFORMIN (GLUCOPHAGE) 1000 MG tablet Take 1 tablet (1,000 mg total) by mouth 2 (two) times daily with a meal.  62 tablet  3  . metoprolol (LOPRESSOR) 50 MG tablet Take 1 tablet (50 mg total) by mouth 2 (two) times daily.  62 tablet  11  . mometasone (NASONEX) 50 MCG/ACT nasal spray 2 sprays in each nostril daily  17 g  12  . pantoprazole (PROTONIX) 40 MG tablet Take 40 mg by mouth 2 (two) times daily as needed.       Marland Kitchen POTASSIUM BICARBONATE PO Take 1 tablet by mouth daily. Over the counter medication      . ziprasidone (GEODON) 40 MG capsule Take 40 mg by mouth daily.       . ziprasidone (GEODON) 60 MG capsule Take 60 mg by mouth at bedtime.        Family History  Problem Relation Age of Onset  . Colon cancer Other   . Hypertension Mother   . Stroke Father     deceased at age 50  . Heart disease Sister   . Anesthesia problems Neg Hx   . Hypotension Neg Hx   . Malignant hyperthermia Neg Hx   . Pseudochol deficiency Neg Hx    History   Social History  . Marital Status: Single    Spouse Name: N/A    Number of Children: N/A  . Years of Education: 12   Occupational History  . UNEMPLOYED   . UNEMPLOYED    Social History Main Topics  . Smoking status: Current Some Day Smoker -- 1.5 packs/day for 30 years    Types: Cigarettes  . Smokeless tobacco: None   Comment: Sometimes she smokes more; up to 2 packs/day., currently trying to quit (September 2013).  . Alcohol Use: No     occasional; intake has decreased since she found out she has ulcer, no significant alcohol since April  2013.  . Drug Use: No  . Sexually Active: No   Other Topics Concern  . 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 Denies fever, chills, diaphoresis HEENT: Denies photophobia, eye pain, redness, hearing loss, ear pain, congestion, sore throat, rhinorrhea, sneezing, mouth sores, trouble swallowing, neck pain, neck stiffness and tinnitus.   Respiratory: Denies SOB, DOE, cough, chest tightness,  and wheezing.   Cardiovascular: Denies chest pain, palpitations and leg swelling.  Gastrointestinal: Positive for nausea, blood in stool.  Denies vomiting, abdominal pain, diarrhea, constipation, and abdominal distention.  Genitourinary: Denies dysuria, urgency, frequency, hematuria, flank pain and difficulty urinating.  Musculoskeletal: Denies myalgias, back pain, joint swelling, arthralgias and gait problem.  Skin: Denies pallor, rash and wound.  Neurological: Denies dizziness, seizures, syncope, weakness, light-headedness, numbness and headaches.  Hematological: Denies adenopathy. Easy bruising, personal or family bleeding history  Psychiatric/Behavioral: Denies suicidal ideation, mood changes, confusion, nervousness, sleep disturbance and agitation  Objective:  Physical Exam: Filed Vitals:   01/04/12 1455  BP: 135/84  Pulse: 103  Temp: 98.2 F (36.8 C)  TempSrc: Oral  Height: 5\' 4"  (1.626 m)  Weight: 187 lb 8 oz (85.049 kg)  SpO2: 99%   Constitutional: Vital signs reviewed.  Patient is a well-developed and well-nourished woman in no acute distress and cooperative with exam. Alert and oriented x3.  Head: Normocephalic and atraumatic Ear: TM normal bilaterally Mouth: no erythema or  exudates, MMM Eyes: PERRL, EOMI, conjunctivae normal, No scleral icterus.  Neck: Supple, Trachea midline normal ROM, No JVD, mass, thyromegaly, or carotid bruit present.  Cardiovascular: RRR, S1 normal, S2 normal, no MRG, pulses symmetric and intact bilaterally Pulmonary/Chest: CTAB, no wheezes, rales, or rhonchi Abdominal: Soft. Mild bilateral lower quadrant pain to palpation, non-distended, bowel sounds are normal, no masses, organomegaly, or guarding present.  GU: no CVA tenderness Musculoskeletal: No joint deformities, erythema, or stiffness, ROM full and no nontender Hematology: no cervical, inginal, or axillary adenopathy.  Neurological: A&O x3, Strength is normal and symmetric bilaterally, cranial nerve II-XII are grossly intact, no focal motor deficit, sensory intact to light touch bilaterally.  Skin: Warm, dry and intact. No rash, cyanosis, or clubbing.  Psychiatric: congruent  mood and labile affect. speech and behavior is normal. Judgment and thought content normal. Cognition and memory are normal.   Assessment & Plan:

## 2012-01-05 LAB — LIPID PANEL
Cholesterol: 126 mg/dL (ref 0–200)
LDL Cholesterol: 60 mg/dL (ref 0–99)
Total CHOL/HDL Ratio: 4.3 Ratio
VLDL: 37 mg/dL (ref 0–40)

## 2012-01-09 ENCOUNTER — Encounter: Payer: Medicare Other | Admitting: Internal Medicine

## 2012-01-10 ENCOUNTER — Encounter (HOSPITAL_COMMUNITY)
Admission: RE | Disposition: A | Discharge status: Home / Self Care | Payer: Self-pay | Source: Ambulatory Visit | Attending: Internal Medicine

## 2012-01-10 ENCOUNTER — Ambulatory Visit (HOSPITAL_COMMUNITY)
Admission: RE | Admit: 2012-01-10 | Discharge: 2012-01-10 | Disposition: A | Discharge status: Home / Self Care | Payer: Medicare Other | Source: Ambulatory Visit | Attending: Internal Medicine | Admitting: Internal Medicine

## 2012-01-10 ENCOUNTER — Encounter (HOSPITAL_COMMUNITY): Payer: Self-pay | Admitting: Anesthesiology

## 2012-01-10 ENCOUNTER — Encounter (HOSPITAL_COMMUNITY): Payer: Self-pay | Admitting: *Deleted

## 2012-01-10 ENCOUNTER — Ambulatory Visit (HOSPITAL_COMMUNITY): Payer: Medicare Other | Admitting: Anesthesiology

## 2012-01-10 DIAGNOSIS — K5730 Diverticulosis of large intestine without perforation or abscess without bleeding: Secondary | ICD-10-CM | POA: Insufficient documentation

## 2012-01-10 DIAGNOSIS — Z01812 Encounter for preprocedural laboratory examination: Secondary | ICD-10-CM | POA: Diagnosis not present

## 2012-01-10 DIAGNOSIS — E119 Type 2 diabetes mellitus without complications: Secondary | ICD-10-CM | POA: Insufficient documentation

## 2012-01-10 DIAGNOSIS — K921 Melena: Secondary | ICD-10-CM | POA: Diagnosis not present

## 2012-01-10 DIAGNOSIS — Z794 Long term (current) use of insulin: Secondary | ICD-10-CM | POA: Insufficient documentation

## 2012-01-10 DIAGNOSIS — I10 Essential (primary) hypertension: Secondary | ICD-10-CM | POA: Diagnosis not present

## 2012-01-10 DIAGNOSIS — K644 Residual hemorrhoidal skin tags: Secondary | ICD-10-CM | POA: Diagnosis not present

## 2012-01-10 DIAGNOSIS — K648 Other hemorrhoids: Secondary | ICD-10-CM

## 2012-01-10 HISTORY — PX: COLONOSCOPY: SHX5424

## 2012-01-10 LAB — GLUCOSE, CAPILLARY: Glucose-Capillary: 177 mg/dL — ABNORMAL HIGH (ref 70–99)

## 2012-01-10 SURGERY — COLONOSCOPY
Anesthesia: Monitor Anesthesia Care | Site: Rectum

## 2012-01-10 MED ORDER — GLYCOPYRROLATE 0.2 MG/ML IJ SOLN
0.2000 mg | Freq: Once | INTRAMUSCULAR | Status: AC
  Administered 2012-01-10: 0.2 mg via INTRAVENOUS

## 2012-01-10 MED ORDER — STERILE WATER FOR IRRIGATION IR SOLN
Status: DC | PRN
  Administered 2012-01-10: 09:00:00

## 2012-01-10 MED ORDER — MIDAZOLAM HCL 2 MG/2ML IJ SOLN
1.0000 mg | INTRAMUSCULAR | Status: DC | PRN
  Administered 2012-01-10: 2 mg via INTRAVENOUS

## 2012-01-10 MED ORDER — ONDANSETRON HCL 4 MG/2ML IJ SOLN: 4.0000 mg | Freq: Once | INTRAMUSCULAR | Status: DC | PRN

## 2012-01-10 MED ORDER — ONDANSETRON HCL 4 MG/2ML IJ SOLN
4.0000 mg | Freq: Once | INTRAMUSCULAR | Status: AC
  Administered 2012-01-10: 4 mg via INTRAVENOUS

## 2012-01-10 MED ORDER — PROPOFOL 10 MG/ML IV EMUL
INTRAVENOUS | Status: AC
  Filled 2012-01-10: qty 20

## 2012-01-10 MED ORDER — FENTANYL CITRATE 0.05 MG/ML IJ SOLN
INTRAMUSCULAR | Status: DC | PRN
  Administered 2012-01-10 (×2): 50 ug via INTRAVENOUS

## 2012-01-10 MED ORDER — FENTANYL CITRATE 0.05 MG/ML IJ SOLN
INTRAMUSCULAR | Status: AC
  Filled 2012-01-10: qty 2

## 2012-01-10 MED ORDER — LACTATED RINGERS IV SOLN
INTRAVENOUS | Status: DC
  Administered 2012-01-10: 08:00:00 via INTRAVENOUS

## 2012-01-10 MED ORDER — PROPOFOL 10 MG/ML IV BOLUS
INTRAVENOUS | Status: DC | PRN
  Administered 2012-01-10: 20 mg via INTRAVENOUS

## 2012-01-10 MED ORDER — ONDANSETRON HCL 4 MG/2ML IJ SOLN
INTRAMUSCULAR | Status: AC
  Filled 2012-01-10: qty 2

## 2012-01-10 MED ORDER — FENTANYL CITRATE 0.05 MG/ML IJ SOLN: 25.0000 ug | INTRAMUSCULAR | Status: DC | PRN

## 2012-01-10 MED ORDER — GLYCOPYRROLATE 0.2 MG/ML IJ SOLN
INTRAMUSCULAR | Status: AC
  Filled 2012-01-10: qty 1

## 2012-01-10 MED ORDER — PROPOFOL INFUSION 10 MG/ML OPTIME
INTRAVENOUS | Status: DC | PRN
  Administered 2012-01-10: 125 ug/kg/min via INTRAVENOUS

## 2012-01-10 MED ORDER — MIDAZOLAM HCL 2 MG/2ML IJ SOLN
INTRAMUSCULAR | Status: AC
  Filled 2012-01-10: qty 2

## 2012-01-10 SURGICAL SUPPLY — 22 items
ELECT REM PT RETURN 9FT ADLT (ELECTROSURGICAL)
ELECTRODE REM PT RTRN 9FT ADLT (ELECTROSURGICAL) IMPLANT
FCP BXJMBJMB 240X2.8X (CUTTING FORCEPS)
FLOOR PAD 36X40 (MISCELLANEOUS) ×2
FORCEPS BIOP RAD 4 LRG CAP 4 (CUTTING FORCEPS) IMPLANT
FORCEPS BIOP RJ4 240 W/NDL (CUTTING FORCEPS)
FORCEPS BXJMBJMB 240X2.8X (CUTTING FORCEPS) IMPLANT
INJECTOR/SNARE I SNARE (MISCELLANEOUS) IMPLANT
LUBRICANT JELLY 4.5OZ STERILE (MISCELLANEOUS) ×1 IMPLANT
MANIFOLD NEPTUNE II (INSTRUMENTS) ×1 IMPLANT
NDL SCLEROTHERAPY 25GX240 (NEEDLE) IMPLANT
NEEDLE SCLEROTHERAPY 25GX240 (NEEDLE) IMPLANT
PAD FLOOR 36X40 (MISCELLANEOUS) IMPLANT
PROBE APC STR FIRE (PROBE) IMPLANT
PROBE INJECTION GOLD (MISCELLANEOUS)
PROBE INJECTION GOLD 7FR (MISCELLANEOUS) IMPLANT
SNARE ROTATE MED OVAL 20MM (MISCELLANEOUS) IMPLANT
SYR 50ML LL SCALE MARK (SYRINGE) ×1 IMPLANT
TRAP SPECIMEN MUCOUS 40CC (MISCELLANEOUS) IMPLANT
TUBING ENDO SMARTCAP PENTAX (MISCELLANEOUS) ×1 IMPLANT
TUBING IRRIGATION ENDOGATOR (MISCELLANEOUS) ×1 IMPLANT
WATER STERILE IRR 1000ML POUR (IV SOLUTION) ×1 IMPLANT

## 2012-01-10 NOTE — H&P (View-Only) (Signed)
Primary Care Physician:  KAPADIA, NEEMA, MD  Primary Gastroenterologist:  Michael Rourk, MD   Chief Complaint  Patient presents with  . Rectal Bleeding    HPI:  [name] is a 46 y.o. female here for f/u of rectal bleeding. Last seen in 11/06/11. Scheduled her for a colonoscopy at that time but she cancelled. Also has h/o IDA. Seen in ED two days ago with c/o red blood noted on toilet tissue. Her H/H was 11.4/33.8. On DRE, no rectal fissure, hemorrhoids, visible blood but she was heme positive. C/O menstrual flow since 12/03/11. Unusual for her, usually menstrual cycle once per month for four days. Changing pad twice per day. Recent rectal bleeding in toilet and on paper, more than what she has had in the past. Intermittent diarrhea, had some the day in the ED but also had solid stool before that. No abdominal pain/rectal pain. Eating well. No heartburn/swallowing issues. Plans to call her gyn for vaginal bleeding. No melena.   Current Outpatient Prescriptions  Medication Sig Dispense Refill  . diphenhydrAMINE (BENADRYL) 25 MG tablet Take 50 mg by mouth daily as needed. Sleep/allergies      . ferrous gluconate (FERGON) 240 (27 FE) MG tablet Take 240 mg by mouth 2 (two) times daily.      . hydrochlorothiazide (HYDRODIURIL) 25 MG tablet Take 25 mg by mouth daily.      . insulin glargine (LANTUS) 100 UNIT/ML injection Inject 28 Units into the skin at bedtime.      . LORazepam (ATIVAN) 0.5 MG tablet Take 1-2 tablets (0.5-1 mg total) by mouth 2 (two) times daily as needed. For anxiety  90 tablet  0  . lovastatin (MEVACOR) 20 MG tablet Take 1 tablet (20 mg total) by mouth at bedtime.  31 tablet  3  . metFORMIN (GLUCOPHAGE) 1000 MG tablet Take 1 tablet (1,000 mg total) by mouth 2 (two) times daily with a meal.  62 tablet  3  . metoprolol (LOPRESSOR) 50 MG tablet Take 1 tablet (50 mg total) by mouth 2 (two) times daily.  62 tablet  11  . mometasone (NASONEX) 50 MCG/ACT nasal spray 2 sprays in  each nostril daily  17 g  12  . pantoprazole (PROTONIX) 40 MG tablet Take 40 mg by mouth 2 (two) times daily.      . potassium chloride SA (K-DUR,KLOR-CON) 20 MEQ tablet Take 1 tablet (20 mEq total) by mouth 2 (two) times daily.  60 tablet  4  . ziprasidone (GEODON) 40 MG capsule Take 40 mg by mouth daily.       . ziprasidone (GEODON) 60 MG capsule Take 60 mg by mouth at bedtime.         Allergies as of 12/19/2011 - Review Complete 12/19/2011  Allergen Reaction Noted  . Metronidazole Shortness Of Breath and Swelling   . Orange Itching 03/27/2011  . Shrimp (shellfish allergy) Shortness Of Breath and Itching 03/27/2011  . Penicillins Hives and Swelling   . Sulfonamide derivatives Hives   . Glipizide Other (See Comments) 01/15/2011  . Sulfamethoxazole w-trimethoprim Rash 02/27/2010    Past Medical History  Diagnosis Date  . Arteriosclerotic cardiovascular disease (ASCVD)     Minimal at cath in 07;negative pharm.stress nuclear study in 8/08 with nl EF; neg stress echo in 2010  . Diabetes mellitus, type 2 2000    Onset in 2000; no insulin  . Hyperlipidemia   . Hypertension `    during treatment with Geodon  . Gastroesophageal reflux   disease     Schatzki's ring  . Anemia, iron deficiency   . Alcohol abuse   . Depression   . Community acquired pneumonia 01/03/10    2011; with pleural effusion-[facility] acute resp failure  . Obesity   . Community acquired pneumonia 05/2010  . Schizoaffective disorder     requiring multiple psychiatric admissions  . Dysphagia     Past Surgical History  Procedure Date  . Dilation and curettage, diagnostic / therapeutic 1992  . Esophagogastroduodenoscopy 09/16/08    Dr. Rourk-->small hiatal hernia/excoriations involving the cardia and mucosa consistent with trauma, antral erosions  of linear petechiae ? gastritis versus early gastric antral vascular  ectasia.. biopsy showed reactive gastropathy. No H. pylori.  . Esophagogastroduodenoscopy 09/2007      Dr. Rourk-->Schatzki ring, dilated to 56 French Maloney dilator, small hiatal hernia, antral erosions, biopsies reactive gastropathy.  . Savory dilation 07/17/2011    [facility] sedation-->distal esophageal stricture s/p dilation, chronic gastritis, multiple ulcers in stomach. no h.pylori  . Colonoscopy 01/2006    internal hemorrhoids    Family History  Problem Relation Age of Onset  . Colon cancer Other   . Hypertension Mother   . Stroke Father     deceased at age 50  . Heart disease Sister   . Anesthesia problems Neg Hx   . Hypotension Neg Hx   . Malignant hyperthermia Neg Hx   . Pseudochol deficiency Neg Hx     History   Social History  . Marital Status: Single    Spouse Name: N/A    Number of Children: N/A  . Years of Education: 12   Occupational History  . UNEMPLOYED   . UNEMPLOYED    Social History Main Topics  . Smoking status: Current Some Day Smoker -- 1.5 packs/day for 30 years    Types: Cigarettes  . Smokeless tobacco: Not on file   Comment: Sometimes she smokes more; up to 2 packs/day., currently trying to quit (September 2013).  . Alcohol Use: No     occasional; intake has decreased since she found out she has ulcer, no significant alcohol since April 2013.  . Drug Use: No  . Sexually Active: No   Other Topics Concern  . 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 Complains of feeling tired. Eyes: Negative for vision changes.  ENT: Negative for hoarseness, difficulty swallowing , nasal congestion. CV: Negative for chest pain, angina, palpitations, dyspnea on exertion, peripheral edema.  Respiratory: Negative for dyspnea at rest, dyspnea  on exertion, cough, sputum, wheezing.  GI: See history of present illness. GU:  Negative for dysuria, hematuria, urinary incontinence, urinary frequency, nocturnal urination.  MS: Negative for joint pain, low back pain.  Derm: Negative for rash or itching.  Neuro: Negative for weakness, abnormal sensation, seizure, frequent headaches, memory loss, confusion.  Psych: chronic anxiety, depression, but no suicidal ideation, hallucinations.  Endo: Negative for unusual weight change.  Heme: Negative for bruising or bleeding. Allergy: Negative for rash or hives.    Physical Examination:  BP 119/77  Pulse 75  Temp 98.4 F (36.9 C) (Temporal)  Ht 5' 4" (1.626 m)  Wt 195 lb 12.8   oz (88.814 kg)  BMI 33.61 kg/m2  LMP 12/03/2011   General: Well-nourished, well-developed in no acute distress.  Head: Normocephalic, atraumatic.   Eyes: Conjunctiva pink, no icterus. Mouth: Oropharyngeal mucosa moist and pink , no lesions erythema or exudate. Neck: Supple without thyromegaly, masses, or lymphadenopathy.  Lungs: Clear to auscultation bilaterally.  Heart: Regular rate and rhythm, no murmurs rubs or gallops.  Abdomen: Bowel sounds are normal, nontender, nondistended, no hepatosplenomegaly or masses, no abdominal bruits or    hernia , no rebound or guarding.   Rectal: done in the ED, deferred to time of colonoscopy Extremities: No lower extremity edema. No clubbing or deformities.  Neuro: Alert and oriented x 4 , grossly normal neurologically.  Skin: Warm and dry, no rash or jaundice.   Psych: Alert and cooperative, normal mood and affect.  Labs: Lab Results  Component Value Date   WBC 8.8 12/17/2011   HGB 11.4* 12/17/2011   HCT 33.8* 12/17/2011   MCV 84.9 12/17/2011   PLT 206 12/17/2011    H/H 13.2/38.7 on 11/15/11.   Imaging Studies: No results found.    

## 2012-01-10 NOTE — Op Note (Signed)
[facility] [address] [place], [address]   COLONOSCOPY PROCEDURE REPORT  PATIENT: [name], [name]  MR#:         [id number] BIRTHDATE: 1965/09/16 , 45  yrs. old GENDER: Female ENDOSCOPIST: R.  Roetta Sessions, MD FACP FACG REFERRED BY:  Vernice Jefferson, M.D. PROCEDURE DATE:  01/10/2012 PROCEDURE:     diagnostic colonoscopy  INDICATIONS: paper hematochezia; history of iron deficiency anemia. Prior history of diarrhea but the patient denies currently.  INFORMED CONSENT:  The risks, benefits, alternatives and imponderables including but not limited to bleeding, perforation as well as the possibility of a missed lesion have been reviewed.  The potential for biopsy, lesion removal, etc. have also been discussed.  Questions have been answered.  All parties agreeable. Please see the history and physical in the medical record for more information.  MEDICATIONS: Deep sedation per Dr. [facility]  DESCRIPTION OF PROCEDURE:  After a digital rectal exam was performed, the     colonoscope was advanced from the anus through the rectum and colon to the area of the cecum, ileocecal valve and appendiceal orifice.  The cecum was deeply intubated.  These structures were well-seen and photographed for the record.  From the level of the cecum and ileocecal valve, the scope was slowly and cautiously withdrawn.  The mucosal surfaces were carefully surveyed utilizing scope tip deflection to facilitate fold flattening as needed.  The scope was pulled down into the rectum where a thorough examination including retroflexion was performed.     FINDINGS:  Excellent preparation. Single anal canal hemorrhoidal tag; otherwise, normal rectum. Scattered ascending colonic diverticula; the remainder of the colonic mucosa appeared normal.  THERAPEUTIC / DIAGNOSTIC MANEUVERS PERFORMED:  none  COMPLICATIONS: none  CECAL WITHDRAWAL TIME:  9 minutes  IMPRESSION:  Single anal  canal hemorrhoidal tag-likely source of trivial hematochezia; right-sided colonic diverticulosis  RECOMMENDATIONS: Resume iron today. Ten-day course of Anusol suppositories. Benefiber 2 tablespoons daily. Office followup to review her condition in 3 months   _______________________________ eSigned:  R. Roetta Sessions, MD FACP [facility] 01/10/2012 9:28 AM   CC:

## 2012-01-10 NOTE — Transfer of Care (Signed)
Immediate Anesthesia Transfer of Care Note  Patient: [name]  Procedure(s) Performed: Procedure(s) (LRB) with comments: COLONOSCOPY (N/A) - entered cecum @ [phone] ; total cecal withdrawal time = 8 minutes  Patient Location: PACU  Anesthesia Type: MAC  Level of Consciousness: awake, alert  and oriented  Airway & Oxygen Therapy: Patient Spontanous Breathing and Patient connected to face mask oxygen  Post-op Assessment: Report given to PACU RN  Post vital signs: Reviewed and stable  Complications: No apparent anesthesia complications

## 2012-01-10 NOTE — Anesthesia Postprocedure Evaluation (Signed)
  Anesthesia Post-op Note  Patient: [name]  Procedure(s) Performed: Procedure(s) (LRB) with comments: COLONOSCOPY (N/A) - entered cecum @ [phone] ; total cecal withdrawal time = 8 minutes  Patient Location: PACU  Anesthesia Type: MAC  Level of Consciousness: sedated  Airway and Oxygen Therapy: Patient Spontanous Breathing and Patient connected to face mask oxygen  Post-op Pain: none  Post-op Assessment: Post-op Vital signs reviewed, Patient's Cardiovascular Status Stable, Respiratory Function Stable, Patent Airway and No signs of Nausea or vomiting  Post-op Vital Signs: Reviewed and stable  Complications: No apparent anesthesia complications

## 2012-01-10 NOTE — Interval H&P Note (Signed)
History and Physical Interval Note:  01/10/2012 8:30 AM  [name]  has presented today for surgery, with the diagnosis of RECTAL BLEEDING, H/O IDA, ANEMIA ETOH ABUSE POLYPHARMACY  The various methods of treatment have been discussed with the patient and family. After consideration of risks, benefits and other options for treatment, the patient has consented to  Procedure(s) (LRB) with comments: COLONOSCOPY (N/A) - 8:25AM as a surgical intervention .  The patient's history has been reviewed, patient examined, no change in status, stable for surgery.  I have reviewed the patient's chart and labs.  Questions were answered to the patient's satisfaction.     Eula Listen

## 2012-01-10 NOTE — Anesthesia Preprocedure Evaluation (Signed)
Anesthesia Evaluation  Patient identified by MRN, date of birth, ID band Patient awake    Reviewed: Allergy & Precautions, H&P , NPO status , Patient's Chart, lab work & pertinent test results  History of Anesthesia Complications Negative for: history of anesthetic complications  Airway Mallampati: II      Dental  (+) Teeth Intact   Pulmonary pneumonia - (Feb. 2012), Current Smoker,  breath sounds clear to auscultation        Cardiovascular hypertension, Pt. on medications Rhythm:Regular Rate:Normal     Neuro/Psych PSYCHIATRIC DISORDERS (hx PTSS) Depression    GI/Hepatic GERD-  Medicated,(+)     substance abuse  alcohol use,   Endo/Other    Renal/GU      Musculoskeletal   Abdominal   Peds  Hematology   Anesthesia Other Findings   Reproductive/Obstetrics                           Anesthesia Physical Anesthesia Plan  ASA: III  Anesthesia Plan: MAC   Post-op Pain Management:    Induction: Intravenous  Airway Management Planned: Simple Face Mask  Additional Equipment:   Intra-op Plan:   Post-operative Plan:   Informed Consent: I have reviewed the patients History and Physical, chart, labs and discussed the procedure including the risks, benefits and alternatives for the proposed anesthesia with the patient or authorized representative who has indicated his/her understanding and acceptance.     Plan Discussed with:   Anesthesia Plan Comments:         Anesthesia Quick Evaluation

## 2012-01-11 ENCOUNTER — Encounter (HOSPITAL_COMMUNITY): Payer: Self-pay | Admitting: Internal Medicine

## 2012-01-16 ENCOUNTER — Telehealth: Payer: Self-pay | Admitting: Internal Medicine

## 2012-01-16 DIAGNOSIS — Z30430 Encounter for insertion of intrauterine contraceptive device: Secondary | ICD-10-CM | POA: Diagnosis not present

## 2012-01-16 DIAGNOSIS — Z3202 Encounter for pregnancy test, result negative: Secondary | ICD-10-CM | POA: Diagnosis not present

## 2012-01-16 DIAGNOSIS — Z130 Encounter for screening for diseases of the blood and blood-forming organs and certain disorders involving the immune mechanism: Secondary | ICD-10-CM | POA: Diagnosis not present

## 2012-01-16 NOTE — Telephone Encounter (Signed)
Pt called to let us know that when she had her TCS that we put something up her nose (oxygen) and her nose is raw and hurting. She wants [name] to advise her what she needs to do or if we can call something in for her. Please call her back at [phone]

## 2012-01-16 NOTE — Telephone Encounter (Signed)
Spoke with pt- she said it was just one nostril that was bothering her. Advised she try some petroleum jelly or small amount of antibiotic ointment for a couple of days. Also advised her that she cannot use petroleum jelly if she uses oxygen all the time. She stated she did not use oxygen at home.

## 2012-01-17 ENCOUNTER — Ambulatory Visit (INDEPENDENT_AMBULATORY_CARE_PROVIDER_SITE_OTHER): Payer: Medicare Other | Admitting: *Deleted

## 2012-01-17 DIAGNOSIS — Z23 Encounter for immunization: Secondary | ICD-10-CM | POA: Diagnosis not present

## 2012-01-24 ENCOUNTER — Other Ambulatory Visit: Payer: Self-pay | Admitting: Internal Medicine

## 2012-01-24 ENCOUNTER — Telehealth: Payer: Self-pay | Admitting: Dietician

## 2012-01-24 DIAGNOSIS — N949 Unspecified condition associated with female genital organs and menstrual cycle: Secondary | ICD-10-CM | POA: Diagnosis not present

## 2012-01-24 DIAGNOSIS — E119 Type 2 diabetes mellitus without complications: Secondary | ICD-10-CM

## 2012-01-24 NOTE — Telephone Encounter (Signed)
Patient returned call- agreed to CDE apptment on 01-28-12 for foot exam and micral as appropriate.

## 2012-01-25 DIAGNOSIS — N949 Unspecified condition associated with female genital organs and menstrual cycle: Secondary | ICD-10-CM | POA: Diagnosis not present

## 2012-01-28 ENCOUNTER — Encounter: Payer: Medicare Other | Admitting: Dietician

## 2012-01-28 ENCOUNTER — Telehealth: Payer: Self-pay | Admitting: Dietician

## 2012-01-28 NOTE — Telephone Encounter (Signed)
Rescheduled missed appointment for diabetes foot exam and microalbumin.   Has new grand baby and was told her tetanus vaccine at [facility] did not contain the vaccine for the whooping cough. Would like to get the whooping cough vaccine at her visit next Monday. Told her i would ask her physician about that.

## 2012-01-29 NOTE — Telephone Encounter (Signed)
I am okay with this, but it will likely be an out of pocket cost - is that okay with her?

## 2012-01-30 NOTE — Telephone Encounter (Signed)
getting a message today that her number is not working. Will ask her this when she comes for her appointment.

## 2012-01-31 ENCOUNTER — Telehealth: Payer: Self-pay | Admitting: Internal Medicine

## 2012-01-31 NOTE — Telephone Encounter (Signed)
Pt called again today saying that ever since her procedure weeks ago that her nose was sore and raw. She was advised to use ointment, but today she is saying that her nose is worse and hurts and she wants something stronger. She said it was because of [name] that this happen and we need to do something for her. Nurse is aware of situation. Please advise how to address this considering patient's history.

## 2012-02-01 NOTE — Telephone Encounter (Signed)
Tried to call pt- NA 

## 2012-02-04 ENCOUNTER — Other Ambulatory Visit: Payer: Self-pay | Admitting: *Deleted

## 2012-02-04 DIAGNOSIS — E785 Hyperlipidemia, unspecified: Secondary | ICD-10-CM

## 2012-02-04 MED ORDER — LOVASTATIN 20 MG PO TABS: 20.0000 mg | ORAL_TABLET | Freq: Every day | ORAL | Status: DC

## 2012-02-04 NOTE — Telephone Encounter (Signed)
Fax from the pharmacy - requesting 90 day supply.

## 2012-02-04 NOTE — Telephone Encounter (Signed)
Tried to call pt- NA 

## 2012-02-05 ENCOUNTER — Ambulatory Visit (INDEPENDENT_AMBULATORY_CARE_PROVIDER_SITE_OTHER): Payer: Medicare Other | Admitting: Dietician

## 2012-02-05 ENCOUNTER — Encounter: Payer: Self-pay | Admitting: *Deleted

## 2012-02-05 DIAGNOSIS — E119 Type 2 diabetes mellitus without complications: Secondary | ICD-10-CM | POA: Diagnosis not present

## 2012-02-05 NOTE — Telephone Encounter (Signed)
Tried to call pt- NA 

## 2012-02-05 NOTE — Progress Notes (Unsigned)
Pt presents c/o L nostril pain and "crusting" since having colonoscopy earlier this year. This is very bothersome. She has used neosporin and triple abx oint from store brand, remains sore and "crusty" to the point it blocks the nasal passage. appt is set at pt's request for thurs at 1415 dr Everardo Beals per [facility].

## 2012-02-05 NOTE — Telephone Encounter (Signed)
Pt finally called back today. She stated she was going to her PCP today and would ask them to look at her nose. She stated it was somewhat better now but is still giving her some problems.

## 2012-02-05 NOTE — Progress Notes (Signed)
Medical Nutrition Therapy:  Appt start time: 1400 end time:  1420.  Assessment:  Primary concerns today: Annual Review.   Usual eating pattern includes 3 meals and 0-1 snacks per day. Usual physical activity includes walks.     Progress Towards Goal(s):  In progress.   Nutritional Diagnosis:  NB-1.1 Food and nutrition-related knowledge deficit As related to lack of prior exposure to action/purpose of diabetes meds/insulin and how to this relates to high blood sugars caused by food intake.  As evidenced by her report and questions about self adjusting lantus for high blood sugars from her dinner intake.    Intervention:  Nutrition education about action of insulin and options that she can discuss with her physician rather to better control blood sugar without risk of hypoglycemia ( Prandin).  Monitoring/Evaluation:  Dietary intake, exercise, blood sugars, and body weight prn.

## 2012-02-06 DIAGNOSIS — Z3046 Encounter for surveillance of implantable subdermal contraceptive: Secondary | ICD-10-CM | POA: Diagnosis not present

## 2012-02-06 DIAGNOSIS — Z30432 Encounter for removal of intrauterine contraceptive device: Secondary | ICD-10-CM | POA: Diagnosis not present

## 2012-02-06 DIAGNOSIS — N920 Excessive and frequent menstruation with regular cycle: Secondary | ICD-10-CM | POA: Diagnosis not present

## 2012-02-07 ENCOUNTER — Ambulatory Visit (INDEPENDENT_AMBULATORY_CARE_PROVIDER_SITE_OTHER): Payer: Medicare Other | Admitting: Internal Medicine

## 2012-02-07 ENCOUNTER — Encounter: Payer: Self-pay | Admitting: Internal Medicine

## 2012-02-07 VITALS — BP 114/75 | HR 88 | Temp 98.7°F | Ht 64.0 in | Wt 195.2 lb

## 2012-02-07 DIAGNOSIS — R141 Gas pain: Secondary | ICD-10-CM | POA: Diagnosis not present

## 2012-02-07 DIAGNOSIS — E119 Type 2 diabetes mellitus without complications: Secondary | ICD-10-CM | POA: Diagnosis not present

## 2012-02-07 DIAGNOSIS — J3489 Other specified disorders of nose and nasal sinuses: Secondary | ICD-10-CM

## 2012-02-07 DIAGNOSIS — I5030 Unspecified diastolic (congestive) heart failure: Secondary | ICD-10-CM

## 2012-02-07 DIAGNOSIS — I10 Essential (primary) hypertension: Secondary | ICD-10-CM | POA: Diagnosis not present

## 2012-02-07 DIAGNOSIS — R140 Abdominal distension (gaseous): Secondary | ICD-10-CM | POA: Insufficient documentation

## 2012-02-07 DIAGNOSIS — R0602 Shortness of breath: Secondary | ICD-10-CM | POA: Diagnosis not present

## 2012-02-07 NOTE — Patient Instructions (Addendum)
-  Continue taking all your medications as prescribed - no changes today  -Stop using nasonex if you aren't having any nasal/sinus congestion, also consider getting a humidifier - I think this will help with the crusting on your nose.  -I am doing a blood test to monitor if your heart is causing you to retain fluid - in the meanwhile, if your symptoms worsen, call the clinic  Please be sure to bring all of your medications with you to every visit.  Should you have any new or worsening symptoms, please be sure to call the clinic at [phone].

## 2012-02-07 NOTE — Assessment & Plan Note (Signed)
Instructed to avoid steroid nasal spray and to use saline spray.  Also suggested use of humidfier.

## 2012-02-07 NOTE — Assessment & Plan Note (Signed)
Lab Results  Component Value Date   HGBA1C 7.0 01/04/2012   HGBA1C  Value: 7.9 (NOTE)                                                                       According to the ADA Clinical Practice Recommendations for 2011, when HbA1c is used as a screening test:   >=6.5%   Diagnostic of Diabetes Mellitus           (if abnormal result  is confirmed)  5.7-6.4%   Increased risk of developing Diabetes Mellitus  References:Diagnosis and Classification of Diabetes Mellitus,Diabetes Care,2011,34(Suppl 1):S62-S69 and Standards of Medical Care in         Diabetes - 2011,Diabetes Care,2011,34  (Suppl 1):S11-S61.* 04/18/2010   CREATININE 0.77 12/25/2011   CREATININE 0.78 08/10/2011   MICROALBUR 0.50 02/05/2012   MICRALBCREAT 5.1 02/05/2012   CHOL 126 01/04/2012   HDL 29* 01/04/2012   TRIG 186* 01/04/2012    Assessment: Diabetes control: controlled Progress toward goals: at goal Barriers to meeting goals: no barriers identified  Plan: Diabetes treatment: continue current medications Refer to: none Instruction/counseling given: reminded to bring blood glucose meter & log to each visit and reminded to bring medications to each visit

## 2012-02-07 NOTE — Assessment & Plan Note (Signed)
Normal renal and liver function.  History of grade II diastolic heart failure. Check pBNP, if elevated, proceed with echo, otherwise monitor.

## 2012-02-07 NOTE — Progress Notes (Signed)
Subjective:   Patient ID: [name] female   DOB: 1965-12-23 46 y.o.   MRN: [id number]  HPI: [name] is a 46 y.o. woman with history of diabetes and hypertension presents for routine follow up.  Her A1c has been well controlled, and she is concerned about other issues today.  1. She feels like she is retaining more than normal fluid.  She is not urinating as much as she would expect.  She has increased the number of pillows she uses to 3-4 pillows at night.  She has been feeling this way for the last few days.  She has cut back on her soda intake.  She notes improved energy but some SOB daily.  She is not clear about if she feels SOB when she lays flat.  2. Crust in left nostril - improving, has been applying vaseline, has started using the heat recently, has not been using nasal steroid spray   Past Medical History  Diagnosis Date  . Arteriosclerotic cardiovascular disease (ASCVD)     Minimal at cath in [facility].stress nuclear study in 8/08 with nl EF; neg stress echo in 2010  . Diabetes mellitus, type 2 2000    Onset in 2000; no insulin  . Hyperlipidemia   . Hypertension `    during treatment with Geodon  . Gastroesophageal reflux disease     Schatzki's ring  . Anemia, iron deficiency   . Alcohol abuse   . Depression   . Community acquired pneumonia 01/03/10    2011; with pleural effusion-[facility] acute resp failure  . Obesity   . Community acquired pneumonia 05/2010  . Schizoaffective disorder     requiring multiple psychiatric admissions  . Dysphagia    Current Outpatient Prescriptions  Medication Sig Dispense Refill  . diphenhydrAMINE (BENADRYL) 25 MG tablet Take 50 mg by mouth daily as needed. Sleep/allergies      . ferrous gluconate (FERGON) 240 (27 FE) MG tablet Take 240 mg by mouth 2 (two) times daily.      . hydrochlorothiazide (HYDRODIURIL) 25 MG tablet Take 1 tablet (25 mg total) by mouth daily.  30 tablet  6  . insulin glargine  (LANTUS) 100 UNIT/ML injection Inject 10-24 Units into the skin at bedtime. Checks sugar before bedtime, dose depends on results  10 mL  3  . LORazepam (ATIVAN) 0.5 MG tablet Take 1-2 tablets (0.5-1 mg total) by mouth 2 (two) times daily as needed. For anxiety  90 tablet  0  . lovastatin (MEVACOR) 20 MG tablet Take 1 tablet (20 mg total) by mouth at bedtime.  90 tablet  0  . metFORMIN (GLUCOPHAGE) 1000 MG tablet Take 1 tablet (1,000 mg total) by mouth 2 (two) times daily with a meal.  62 tablet  3  . metoprolol (LOPRESSOR) 50 MG tablet Take 1 tablet (50 mg total) by mouth 2 (two) times daily.  62 tablet  11  . mometasone (NASONEX) 50 MCG/ACT nasal spray 2 sprays in each nostril daily  17 g  12  . pantoprazole (PROTONIX) 40 MG tablet Take 40 mg by mouth 2 (two) times daily as needed.       Marland Kitchen POTASSIUM BICARBONATE PO Take 1 tablet by mouth daily. Over the counter medication      . traZODone (DESYREL) 100 MG tablet Take 1 tablet (100 mg total) by mouth at bedtime.      . ziprasidone (GEODON) 40 MG capsule Take 40 mg by mouth daily.       Marland Kitchen  ziprasidone (GEODON) 60 MG capsule Take 60 mg by mouth at bedtime.        Family History  Problem Relation Age of Onset  . Colon cancer Other   . Hypertension Mother   . Stroke Father     deceased at age 30  . Heart disease Sister   . Anesthesia problems Neg Hx   . Hypotension Neg Hx   . Malignant hyperthermia Neg Hx   . Pseudochol deficiency Neg Hx    History   Social History  . Marital Status: Single    Spouse Name: N/A    Number of Children: N/A  . Years of Education: 12   Occupational History  . UNEMPLOYED   . UNEMPLOYED    Social History Main Topics  . Smoking status: Current Some Day Smoker -- 1.5 packs/day for 30 years    Types: Cigarettes  . Smokeless tobacco: None   Comment: Sometimes she smokes more; up to 2 packs/day., currently trying to quit (September 2013).  . Alcohol Use: No     occasional; intake has decreased since she found  out she has ulcer, no significant alcohol since April 2013.  . Drug Use: No  . Sexually Active: No   Other Topics Concern  . 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  HEENT: Denies photophobia, eye pain, redness, hearing loss, ear pain, congestion, sore throat, rhinorrhea, sneezing, mouth sores, trouble swallowing, neck pain, neck stiffness and tinnitus.   Respiratory: Denies DOE, cough, chest tightness,  and wheezing.   Cardiovascular: Denies chest pain, palpitations and  Gastrointestinal: Denies nausea, vomiting, abdominal pain, diarrhea, constipation, blood in stool and abdominal distention.  Genitourinary: Denies dysuria, urgency, frequency, hematuria, flank pain and difficulty urinating.  Musculoskeletal: Denies myalgias, back pain, joint swelling, arthralgias and gait problem.  Skin: Denies pallor, rash and wound.  Neurological: Denies dizziness, seizures, syncope, weakness, light-headedness, numbness and headaches.   Objective:  Physical Exam: Filed Vitals:   02/07/12 1447  BP: 114/75  Pulse: 88  Temp: 98.7 F (37.1 C)  TempSrc: Oral  Height: 5\' 4"  (1.626 m)  Weight: 195 lb 3.2 oz (88.542 kg)  SpO2: 100%   Constitutional: Vital signs reviewed.  Patient is a well-developed and well-nourished woman in no acute distress and cooperative with exam.  Head: Normocephalic and atraumatic Nose: dry, crust appearance of inner left nostril Eyes: PERRL, EOMI, conjunctivae normal, No scleral icterus.   Cardiovascular: RRR, S1 normal, S2 normal, no MRG, pulses symmetric and intact bilaterally, no lower extremity edema Pulmonary/Chest: CTAB, no wheezes, rales,  or rhonchi Abdominal: Soft. Non-tender, non-distended, bowel sounds are normal, no masses, organomegaly, or guarding present.   Neurological: A&O x3 Skin: Warm, dry and intact. No rash, cyanosis, or clubbing.  Psychiatric: Normal mood and affect. speech and behavior is normal. Judgment and thought content normal. Cognition and memory are normal.   Assessment & Plan:   Case and care discussed with Dr. Rogelia Boga. Please see problem oriented charting for further details. Patient to return in 3 months for DM follow up.

## 2012-02-07 NOTE — Assessment & Plan Note (Signed)
Lab Results  Component Value Date   NA 133* 12/25/2011   K 3.7 12/25/2011   CL 101 12/25/2011   CO2 21 12/25/2011   BUN 13 12/25/2011   CREATININE 0.77 12/25/2011   CREATININE 0.78 08/10/2011    BP Readings from Last 3 Encounters:  02/07/12 114/75  01/10/12 120/94  01/10/12 120/94    Assessment: Hypertension control:  controlled  Progress toward goals:  at goal Barriers to meeting goals:  no barriers identified  Plan: Hypertension treatment:  continue current medications - metoprolol and hctz

## 2012-02-15 ENCOUNTER — Telehealth: Payer: Self-pay | Admitting: Internal Medicine

## 2012-02-15 NOTE — Telephone Encounter (Signed)
Patient called complaining of insatiable hunger today.  She states that she has eating more then three times what she normally eats and she continues to be hungry.  She states that she took 20 U of her Lantus last night.  Her blood sugars today have gotten only as high as 150s when she checks.  She has also noticed that she has had loose stool today.  She denies chest pain, nausea, vomiting, abdominal pain, blood in the stool, or low blood sugars.  She has had a few "hot flashes" which she has never had before.    We discussed that these symptoms may have been secondary to some hypoglycemia since she took so much of the Lantus last night.  We will have her only take 10 U tonight at bedtime and to track her sugars 4 times daily (prior to meals) for the next week and then make an appointment to be seen in the clinic.  She was advised that if she noted low blood sugar, began to have chest or abdominal pain, noticed blood in her stool, or began to vomit and not be able to keep down liquids that she should be evaluated at Urgent care, or the ED.  Patient is in agreement with this plan.  Gonzalo Waymire Internal Medicine Resident Pager: [phone] 02/15/2012 1:11 AM

## 2012-03-03 DIAGNOSIS — F319 Bipolar disorder, unspecified: Secondary | ICD-10-CM | POA: Diagnosis not present

## 2012-03-03 DIAGNOSIS — F411 Generalized anxiety disorder: Secondary | ICD-10-CM | POA: Diagnosis not present

## 2012-03-09 ENCOUNTER — Emergency Department (HOSPITAL_COMMUNITY)
Admission: EM | Admit: 2012-03-09 | Discharge: 2012-03-09 | Disposition: A | Discharge status: Home / Self Care | Payer: Medicare Other | Attending: Emergency Medicine | Admitting: Emergency Medicine

## 2012-03-09 ENCOUNTER — Encounter (HOSPITAL_COMMUNITY): Payer: Self-pay | Admitting: *Deleted

## 2012-03-09 ENCOUNTER — Emergency Department (HOSPITAL_COMMUNITY): Payer: Medicare Other

## 2012-03-09 DIAGNOSIS — Z8701 Personal history of pneumonia (recurrent): Secondary | ICD-10-CM | POA: Insufficient documentation

## 2012-03-09 DIAGNOSIS — F17200 Nicotine dependence, unspecified, uncomplicated: Secondary | ICD-10-CM | POA: Insufficient documentation

## 2012-03-09 DIAGNOSIS — E785 Hyperlipidemia, unspecified: Secondary | ICD-10-CM | POA: Diagnosis not present

## 2012-03-09 DIAGNOSIS — J209 Acute bronchitis, unspecified: Secondary | ICD-10-CM | POA: Insufficient documentation

## 2012-03-09 DIAGNOSIS — Z79899 Other long term (current) drug therapy: Secondary | ICD-10-CM | POA: Insufficient documentation

## 2012-03-09 DIAGNOSIS — R5383 Other fatigue: Secondary | ICD-10-CM | POA: Insufficient documentation

## 2012-03-09 DIAGNOSIS — Z794 Long term (current) use of insulin: Secondary | ICD-10-CM | POA: Insufficient documentation

## 2012-03-09 DIAGNOSIS — I2510 Atherosclerotic heart disease of native coronary artery without angina pectoris: Secondary | ICD-10-CM | POA: Insufficient documentation

## 2012-03-09 DIAGNOSIS — I10 Essential (primary) hypertension: Secondary | ICD-10-CM | POA: Diagnosis not present

## 2012-03-09 DIAGNOSIS — F329 Major depressive disorder, single episode, unspecified: Secondary | ICD-10-CM | POA: Diagnosis not present

## 2012-03-09 DIAGNOSIS — K219 Gastro-esophageal reflux disease without esophagitis: Secondary | ICD-10-CM | POA: Insufficient documentation

## 2012-03-09 DIAGNOSIS — E860 Dehydration: Secondary | ICD-10-CM | POA: Diagnosis not present

## 2012-03-09 DIAGNOSIS — D509 Iron deficiency anemia, unspecified: Secondary | ICD-10-CM | POA: Diagnosis not present

## 2012-03-09 DIAGNOSIS — R197 Diarrhea, unspecified: Secondary | ICD-10-CM | POA: Insufficient documentation

## 2012-03-09 DIAGNOSIS — E119 Type 2 diabetes mellitus without complications: Secondary | ICD-10-CM | POA: Diagnosis not present

## 2012-03-09 DIAGNOSIS — F3289 Other specified depressive episodes: Secondary | ICD-10-CM | POA: Insufficient documentation

## 2012-03-09 DIAGNOSIS — F259 Schizoaffective disorder, unspecified: Secondary | ICD-10-CM | POA: Insufficient documentation

## 2012-03-09 DIAGNOSIS — R0989 Other specified symptoms and signs involving the circulatory and respiratory systems: Secondary | ICD-10-CM | POA: Diagnosis not present

## 2012-03-09 DIAGNOSIS — R05 Cough: Secondary | ICD-10-CM | POA: Diagnosis not present

## 2012-03-09 DIAGNOSIS — E669 Obesity, unspecified: Secondary | ICD-10-CM | POA: Insufficient documentation

## 2012-03-09 DIAGNOSIS — R5381 Other malaise: Secondary | ICD-10-CM | POA: Insufficient documentation

## 2012-03-09 LAB — URINALYSIS, ROUTINE W REFLEX MICROSCOPIC
Glucose, UA: NEGATIVE mg/dL
Leukocytes, UA: NEGATIVE
Protein, ur: NEGATIVE mg/dL
Specific Gravity, Urine: >1.03 — ABNORMAL HIGH (ref 1.005–1.030)
Urobilinogen, UA: 0.2 mg/dL (ref 0.0–1.0)

## 2012-03-09 LAB — CBC WITH DIFFERENTIAL/PLATELET
Basophils Absolute: 0 10*3/uL (ref 0.0–0.1)
Eosinophils Relative: 2 % (ref 0–5)
Lymphocytes Relative: 34 % (ref 12–46)
MCV: 84.4 fL (ref 78.0–100.0)
Neutrophils Relative %: 58 % (ref 43–77)
Platelets: 244 10*3/uL (ref 150–400)
RBC: 5.01 MIL/uL (ref 3.87–5.11)
RDW: 14.3 % (ref 11.5–15.5)
WBC: 9 10*3/uL (ref 4.0–10.5)

## 2012-03-09 LAB — BASIC METABOLIC PANEL
CO2: 23 mEq/L (ref 19–32)
Calcium: 9.9 mg/dL (ref 8.4–10.5)
GFR calc non Af Amer: >90 mL/min (ref >90)
Potassium: 3.5 mEq/L (ref 3.5–5.1)
Sodium: 134 mEq/L — ABNORMAL LOW (ref 135–145)

## 2012-03-09 MED ORDER — SODIUM CHLORIDE 0.9 % IV BOLUS (SEPSIS)
1000.0000 mL | Freq: Once | INTRAVENOUS | Status: AC
  Administered 2012-03-09: 1000 mL via INTRAVENOUS

## 2012-03-09 MED ORDER — BENZONATATE 100 MG PO CAPS: 100.0000 mg | ORAL_CAPSULE | Freq: Three times a day (TID) | ORAL | Status: DC

## 2012-03-09 MED ORDER — DIPHENOXYLATE-ATROPINE 2.5-0.025 MG PO TABS
2.0000 | ORAL_TABLET | Freq: Once | ORAL | Status: AC
  Administered 2012-03-09: 2 via ORAL
  Filled 2012-03-09: qty 2

## 2012-03-09 MED ORDER — DIPHENOXYLATE-ATROPINE 2.5-0.025 MG PO TABS: 1.0000 | ORAL_TABLET | Freq: Four times a day (QID) | ORAL | Status: DC | PRN

## 2012-03-09 MED ORDER — ALBUTEROL SULFATE HFA 108 (90 BASE) MCG/ACT IN AERS
2.0000 | INHALATION_SPRAY | Freq: Once | RESPIRATORY_TRACT | Status: AC
  Administered 2012-03-09: 2 via RESPIRATORY_TRACT
  Filled 2012-03-09: qty 6.7

## 2012-03-09 NOTE — ED Notes (Signed)
Reports dry cough, n/d, increased weakness and fever x 1 wk.  Reports ? Dehydration.  Denies pain.

## 2012-03-10 NOTE — ED Provider Notes (Signed)
History     CSN: [id number]  Arrival date & time 03/09/12  0812   First MD Initiated Contact with Patient 03/09/12 [phone]      Chief Complaint  Patient presents with  . multiple complaints     (Consider location/radiation/quality/duration/timing/severity/associated sxs/prior treatment) HPI Comments: [name] presents with multiple complaints including a dry, nonproductive cough without shortness of breath or chest pain,  But has had subjective fever along with increased weakness and also reports diarrhea which has been watery,  Nonbloody, occuring 5-6 times per day for the past 2 days.  She feels increasingly weak and dehydrated. She denies nausea, vomiting and abdominal pain.  She has taken pepto bismol without relief of diarrhea, the last episode occuring just prior to arrival.  She does have a history of diabetes,  Reporting her cbg's have been well controlled,  Usually under 200 when checked.  The history is provided by the patient.    Past Medical History  Diagnosis Date  . Arteriosclerotic cardiovascular disease (ASCVD)     Minimal at cath in [facility].stress nuclear study in 8/08 with nl EF; neg stress echo in 2010  . Diabetes mellitus, type 2 2000    Onset in 2000; no insulin  . Hyperlipidemia   . Hypertension `    during treatment with Geodon  . Gastroesophageal reflux disease     Schatzki's ring  . Anemia, iron deficiency   . Alcohol abuse   . Depression   . Community acquired pneumonia 01/03/10    2011; with [facility]  . Obesity   . Community acquired pneumonia 05/2010  . Schizoaffective disorder     requiring multiple psychiatric admissions  . Dysphagia     Past Surgical History  Procedure Date  . Dilation and curettage, diagnostic / therapeutic 1992  . Esophagogastroduodenoscopy 09/16/08    Dr. Ronni Rumble hiatal hernia/excoriations involving the cardia and mucosa consistent with trauma, antral  erosions  of linear petechiae ? gastritis versus early gastric antral vascular  ectasia.Marland Kitchen biopsy showed reactive gastropathy. No H. pylori.  . Esophagogastroduodenoscopy 09/2007    Dr. Rinaldo Ratel ring, dilated to 56 French Maloney dilator, small hiatal hernia, antral erosions, biopsies reactive gastropathy.  Gaspar Bidding dilation 07/17/2011    [facility] sedation-->distal esophageal stricture s/p dilation, chronic gastritis, multiple ulcers in stomach. no h.pylori  . Colonoscopy 01/2006    internal hemorrhoids  . Colonoscopy 01/10/2012    Procedure: COLONOSCOPY;  Surgeon: Corbin Ade, MD;  Location: [facility];  Service: Endoscopy;  Laterality: N/A;  entered cecum @ [phone] ; total cecal withdrawal time = 8 minutes    Family History  Problem Relation Age of Onset  . Colon cancer Other   . Hypertension Mother   . Stroke Father     deceased at age 81  . Heart disease Sister   . Anesthesia problems Neg Hx   . Hypotension Neg Hx   . Malignant hyperthermia Neg Hx   . Pseudochol deficiency Neg Hx     History  Substance Use Topics  . Smoking status: Current Some Day Smoker -- 1.5 packs/day for 30 years    Types: Cigarettes  . Smokeless tobacco: Not on file     Comment: Sometimes she smokes more; up to 2 packs/day., currently trying to quit (September 2013).  . Alcohol Use: No     Comment: occasional; intake has decreased since she found out she has ulcer, no significant alcohol since April 2013.  OB History    Grav Para Term Preterm Abortions TAB SAB Ect Mult Living                  Review of Systems  Constitutional: Positive for fatigue. Negative for fever.  HENT: Negative for congestion, sore throat and neck pain.   Eyes: Negative.   Respiratory: Positive for cough. Negative for chest tightness and shortness of breath.   Cardiovascular: Negative for chest pain.  Gastrointestinal: Positive for diarrhea. Negative for nausea, vomiting and abdominal pain.  Genitourinary: Negative.     Musculoskeletal: Negative for joint swelling and arthralgias.  Skin: Negative.  Negative for rash and wound.  Neurological: Negative for dizziness, light-headedness, numbness and headaches.  Hematological: Negative.   Psychiatric/Behavioral: Negative.     Allergies  Metronidazole; Orange; Shrimp; Penicillins; Sulfonamide derivatives; Glipizide; and Sulfamethoxazole w-trimethoprim  Home Medications   Current Outpatient Rx  Name  Route  Sig  Dispense  Refill  . BISMUTH SUBSALICYLATE 262 MG/15ML PO SUSP   Oral   Take 30 mLs by mouth every 6 (six) hours as needed. Acid indigestion         . CETIRIZINE HCL 10 MG PO TABS   Oral   Take 10 mg by mouth at bedtime.         Marland Kitchen DIPHENHYDRAMINE HCL 25 MG PO TABS   Oral   Take 50 mg by mouth daily as needed. Sleep/allergies         . FERROUS GLUCONATE 240 (27 FE) MG PO TABS   Oral   Take 240 mg by mouth 2 (two) times daily.         Marland Kitchen HYDROCHLOROTHIAZIDE 25 MG PO TABS   Oral   Take 1 tablet (25 mg total) by mouth daily.   30 tablet   6   . IBUPROFEN 200 MG PO TABS   Oral   Take 400 mg by mouth every 8 (eight) hours as needed. Aches and pains, fever         . INSULIN GLARGINE 100 UNIT/ML [place] SOLN   Subcutaneous   Inject 10-24 Units into the skin at bedtime. Checks sugar before bedtime, dose depends on results   10 mL   3   . LORAZEPAM 0.5 MG PO TABS   Oral   Take 1-2 tablets (0.5-1 mg total) by mouth 2 (two) times daily as needed. For anxiety   90 tablet   0   . LOVASTATIN 20 MG PO TABS   Oral   Take 1 tablet (20 mg total) by mouth at bedtime.   90 tablet   0   . METFORMIN HCL 1000 MG PO TABS   Oral   Take 1 tablet (1,000 mg total) by mouth 2 (two) times daily with a meal.   62 tablet   3   . METOPROLOL TARTRATE 50 MG PO TABS   Oral   Take 1 tablet (50 mg total) by mouth 2 (two) times daily.   62 tablet   11   . PANTOPRAZOLE SODIUM 40 MG PO TBEC   Oral   Take 40 mg by mouth 2 (two) times daily.           Marland Kitchen POTASSIUM BICARBONATE PO   Oral   Take 1 tablet by mouth daily. Over the counter medication         . TRAZODONE HCL 100 MG PO TABS   Oral   Take 1 tablet (100 mg total) by mouth at bedtime.         Marland Kitchen  ZIPRASIDONE HCL 60 MG PO CAPS   Oral   Take 60 mg by mouth 2 (two) times daily with a meal.          . BENZONATATE 100 MG PO CAPS   Oral   Take 1 capsule (100 mg total) by mouth every 8 (eight) hours.   21 capsule   0   . DIPHENOXYLATE-ATROPINE 2.5-0.025 MG PO TABS   Oral   Take 1 tablet by mouth 4 (four) times daily as needed for diarrhea or loose stools.   30 tablet   0   . MOMETASONE FUROATE 50 MCG/ACT NA SUSP      2 sprays in each nostril daily   17 g   12     BP 125/74  Pulse 70  Temp 99.9 F (37.7 C) (Oral)  Resp 16  Ht 5\' 4"  (1.626 m)  Wt 196 lb (88.905 kg)  BMI 33.64 kg/m2  SpO2 98%  LMP 02/17/2012  Physical Exam  Nursing note and vitals reviewed. Constitutional: She appears well-developed and well-nourished.  HENT:  Head: Normocephalic and atraumatic.  Mouth/Throat: Mucous membranes are dry.  Eyes: Conjunctivae normal are normal.  Neck: Normal range of motion.  Cardiovascular: Normal rate, regular rhythm, normal heart sounds and intact distal pulses.   Pulmonary/Chest: Effort normal and breath sounds normal. No respiratory distress. She has no wheezes. She has no rales.       Occasional lower bilateral wheeze which clears with cough.  Abdominal: Soft. Bowel sounds are normal. She exhibits no distension and no mass. There is no tenderness. There is no rebound.  Musculoskeletal: Normal range of motion.  Neurological: She is alert.  Skin: Skin is warm and dry.  Psychiatric: She has a normal mood and affect.    ED Course  Procedures (including critical care time)  Labs Reviewed  BASIC METABOLIC PANEL - Abnormal; Notable for the following:    Sodium 134 (*)     Glucose, Bld 175 (*)     All other components within normal limits   URINALYSIS, ROUTINE W REFLEX MICROSCOPIC - Abnormal; Notable for the following:    Specific Gravity, Urine >1.030 (*)     All other components within normal limits  CBC WITH DIFFERENTIAL  LAB REPORT - SCANNED   Dg Chest 2 View  03/09/2012  *RADIOLOGY REPORT*  Clinical Data: Cough and congestion  CHEST - 2 VIEW  Comparison: 07/03/2011.  Findings: Midline trachea.  Normal heart size and mediastinal contours. No pleural effusion or pneumothorax.  Clear lungs.  IMPRESSION: Normal chest.   Original Report Authenticated By: Jeronimo Greaves, M.D.      1. Bronchitis, acute   2. Dehydration   3. Diarrhea     Pt was given IV fluids and she also tolerated po fluids,  Lomotil given,  No diarrhea in dept.  Felt improved at dc.   MDM  Labs and xrays reviewed.  Prescribed lomotil,  Tessalon,  Albuterol mdi given.  Rest,  Increased fluids.  Recheck with pcp if not improving over the next several days or if sx worsen.        Burgess Amor, [place] 03/10/12 2243

## 2012-03-12 NOTE — ED Provider Notes (Signed)
Medical screening examination/treatment/procedure(s) were performed by non-physician practitioner and as supervising physician I was immediately available for consultation/collaboration.   Benny Lennert, MD 03/12/12 1500

## 2012-03-17 ENCOUNTER — Other Ambulatory Visit: Payer: Self-pay | Admitting: Internal Medicine

## 2012-03-18 ENCOUNTER — Emergency Department (HOSPITAL_COMMUNITY)
Admission: EM | Admit: 2012-03-18 | Discharge: 2012-03-18 | Disposition: A | Discharge status: Home / Self Care | Payer: Medicare Other | Attending: Emergency Medicine | Admitting: Emergency Medicine

## 2012-03-18 ENCOUNTER — Encounter (HOSPITAL_COMMUNITY): Payer: Self-pay

## 2012-03-18 DIAGNOSIS — Z8679 Personal history of other diseases of the circulatory system: Secondary | ICD-10-CM | POA: Diagnosis not present

## 2012-03-18 DIAGNOSIS — IMO0001 Reserved for inherently not codable concepts without codable children: Secondary | ICD-10-CM | POA: Diagnosis not present

## 2012-03-18 DIAGNOSIS — T5491XA Toxic effect of unspecified corrosive substance, accidental (unintentional), initial encounter: Secondary | ICD-10-CM

## 2012-03-18 DIAGNOSIS — Z8659 Personal history of other mental and behavioral disorders: Secondary | ICD-10-CM | POA: Insufficient documentation

## 2012-03-18 DIAGNOSIS — F17200 Nicotine dependence, unspecified, uncomplicated: Secondary | ICD-10-CM | POA: Diagnosis not present

## 2012-03-18 DIAGNOSIS — E785 Hyperlipidemia, unspecified: Secondary | ICD-10-CM | POA: Diagnosis not present

## 2012-03-18 DIAGNOSIS — Z8719 Personal history of other diseases of the digestive system: Secondary | ICD-10-CM | POA: Diagnosis not present

## 2012-03-18 DIAGNOSIS — D509 Iron deficiency anemia, unspecified: Secondary | ICD-10-CM | POA: Insufficient documentation

## 2012-03-18 DIAGNOSIS — Z79899 Other long term (current) drug therapy: Secondary | ICD-10-CM | POA: Diagnosis not present

## 2012-03-18 DIAGNOSIS — Z794 Long term (current) use of insulin: Secondary | ICD-10-CM | POA: Diagnosis not present

## 2012-03-18 DIAGNOSIS — I10 Essential (primary) hypertension: Secondary | ICD-10-CM | POA: Diagnosis not present

## 2012-03-18 NOTE — ED Notes (Signed)
Pt states she accidentally drank bleach three days ago. Complain of burning in her throat and stomach now. Also, states her bp has been high

## 2012-03-18 NOTE — ED Notes (Signed)
Pt walked out of room past nurses station stating she changed her mind and does not wish to be seen by EDP.  States, "I don't need it.  I don't need to be seen.".

## 2012-03-19 ENCOUNTER — Ambulatory Visit: Payer: Medicare Other | Admitting: Internal Medicine

## 2012-03-21 ENCOUNTER — Encounter (HOSPITAL_COMMUNITY): Payer: Self-pay | Admitting: Family Medicine

## 2012-03-21 ENCOUNTER — Ambulatory Visit: Payer: Medicare Other | Admitting: Internal Medicine

## 2012-03-21 ENCOUNTER — Other Ambulatory Visit: Payer: Self-pay | Admitting: *Deleted

## 2012-03-21 ENCOUNTER — Emergency Department (HOSPITAL_COMMUNITY)
Admission: EM | Admit: 2012-03-21 | Discharge: 2012-03-23 | Disposition: A | Discharge status: Other Acute Inpatient Hospital | Payer: Medicare Other | Source: Home / Self Care | Attending: Emergency Medicine | Admitting: Emergency Medicine

## 2012-03-21 DIAGNOSIS — F3289 Other specified depressive episodes: Secondary | ICD-10-CM | POA: Insufficient documentation

## 2012-03-21 DIAGNOSIS — Z862 Personal history of diseases of the blood and blood-forming organs and certain disorders involving the immune mechanism: Secondary | ICD-10-CM | POA: Insufficient documentation

## 2012-03-21 DIAGNOSIS — R45851 Suicidal ideations: Secondary | ICD-10-CM

## 2012-03-21 DIAGNOSIS — Z79899 Other long term (current) drug therapy: Secondary | ICD-10-CM | POA: Insufficient documentation

## 2012-03-21 DIAGNOSIS — F259 Schizoaffective disorder, unspecified: Secondary | ICD-10-CM | POA: Insufficient documentation

## 2012-03-21 DIAGNOSIS — K219 Gastro-esophageal reflux disease without esophagitis: Secondary | ICD-10-CM | POA: Insufficient documentation

## 2012-03-21 DIAGNOSIS — E119 Type 2 diabetes mellitus without complications: Secondary | ICD-10-CM | POA: Insufficient documentation

## 2012-03-21 DIAGNOSIS — F419 Anxiety disorder, unspecified: Secondary | ICD-10-CM

## 2012-03-21 DIAGNOSIS — Z8701 Personal history of pneumonia (recurrent): Secondary | ICD-10-CM | POA: Insufficient documentation

## 2012-03-21 DIAGNOSIS — E669 Obesity, unspecified: Secondary | ICD-10-CM | POA: Insufficient documentation

## 2012-03-21 DIAGNOSIS — F329 Major depressive disorder, single episode, unspecified: Secondary | ICD-10-CM | POA: Insufficient documentation

## 2012-03-21 DIAGNOSIS — F489 Nonpsychotic mental disorder, unspecified: Secondary | ICD-10-CM | POA: Diagnosis not present

## 2012-03-21 DIAGNOSIS — F1010 Alcohol abuse, uncomplicated: Secondary | ICD-10-CM | POA: Insufficient documentation

## 2012-03-21 DIAGNOSIS — F32A Depression, unspecified: Secondary | ICD-10-CM

## 2012-03-21 DIAGNOSIS — E785 Hyperlipidemia, unspecified: Secondary | ICD-10-CM | POA: Insufficient documentation

## 2012-03-21 DIAGNOSIS — Z794 Long term (current) use of insulin: Secondary | ICD-10-CM | POA: Insufficient documentation

## 2012-03-21 DIAGNOSIS — F39 Unspecified mood [affective] disorder: Secondary | ICD-10-CM | POA: Insufficient documentation

## 2012-03-21 DIAGNOSIS — F17200 Nicotine dependence, unspecified, uncomplicated: Secondary | ICD-10-CM | POA: Insufficient documentation

## 2012-03-21 DIAGNOSIS — I498 Other specified cardiac arrhythmias: Secondary | ICD-10-CM | POA: Diagnosis not present

## 2012-03-21 DIAGNOSIS — I10 Essential (primary) hypertension: Secondary | ICD-10-CM | POA: Insufficient documentation

## 2012-03-21 LAB — ACETAMINOPHEN LEVEL: Acetaminophen (Tylenol), Serum: <15 ug/mL (ref 10–30)

## 2012-03-21 LAB — COMPREHENSIVE METABOLIC PANEL
Alkaline Phosphatase: 75 U/L (ref 39–117)
BUN: 13 mg/dL (ref 6–23)
Calcium: 9.5 mg/dL (ref 8.4–10.5)
Creatinine, Ser: 1.02 mg/dL (ref 0.50–1.10)
GFR calc Af Amer: 76 mL/min — ABNORMAL LOW (ref >90)
Glucose, Bld: 169 mg/dL — ABNORMAL HIGH (ref 70–99)
Total Protein: 6.9 g/dL (ref 6.0–8.3)

## 2012-03-21 LAB — CBC WITH DIFFERENTIAL/PLATELET
Eosinophils Absolute: 0.1 10*3/uL (ref 0.0–0.7)
Eosinophils Relative: 1 % (ref 0–5)
Hemoglobin: 14.5 g/dL (ref 12.0–15.0)
Lymphs Abs: 3.9 10*3/uL (ref 0.7–4.0)
MCH: 29.2 pg (ref 26.0–34.0)
MCV: 84.1 fL (ref 78.0–100.0)
Monocytes Absolute: 0.5 10*3/uL (ref 0.1–1.0)
Monocytes Relative: 5 % (ref 3–12)
RBC: 4.97 MIL/uL (ref 3.87–5.11)

## 2012-03-21 MED ORDER — SIMVASTATIN 20 MG PO TABS
20.0000 mg | ORAL_TABLET | Freq: Every day | ORAL | Status: DC
  Administered 2012-03-22: 20 mg via ORAL
  Filled 2012-03-21: qty 1

## 2012-03-21 MED ORDER — METFORMIN HCL 500 MG PO TABS
1000.0000 mg | ORAL_TABLET | Freq: Two times a day (BID) | ORAL | Status: DC
  Administered 2012-03-22 (×2): 1000 mg via ORAL
  Filled 2012-03-21 (×2): qty 2

## 2012-03-21 MED ORDER — IBUPROFEN 200 MG PO TABS: 600.0000 mg | ORAL_TABLET | Freq: Three times a day (TID) | ORAL | Status: DC | PRN

## 2012-03-21 MED ORDER — NICOTINE 21 MG/24HR TD PT24
21.0000 mg | MEDICATED_PATCH | Freq: Every day | TRANSDERMAL | Status: DC
  Administered 2012-03-21 – 2012-03-22 (×2): 21 mg via TRANSDERMAL
  Filled 2012-03-21 (×2): qty 1

## 2012-03-21 MED ORDER — INSULIN ASPART 100 UNIT/ML ~~LOC~~ SOLN
0.0000 [IU] | Freq: Three times a day (TID) | SUBCUTANEOUS | Status: DC
  Administered 2012-03-22: 5 [IU] via SUBCUTANEOUS
  Administered 2012-03-22: 2 [IU] via SUBCUTANEOUS
  Filled 2012-03-21: qty 1

## 2012-03-21 MED ORDER — LORAZEPAM 1 MG PO TABS
1.0000 mg | ORAL_TABLET | Freq: Three times a day (TID) | ORAL | Status: DC | PRN
  Administered 2012-03-21 – 2012-03-22 (×3): 1 mg via ORAL
  Filled 2012-03-21 (×3): qty 1

## 2012-03-21 MED ORDER — LORATADINE 10 MG PO TABS
10.0000 mg | ORAL_TABLET | Freq: Every day | ORAL | Status: DC
  Administered 2012-03-21 – 2012-03-22 (×2): 10 mg via ORAL
  Filled 2012-03-21 (×2): qty 1

## 2012-03-21 MED ORDER — TRAZODONE HCL 50 MG PO TABS
100.0000 mg | ORAL_TABLET | Freq: Every day | ORAL | Status: DC
  Administered 2012-03-21 – 2012-03-22 (×2): 100 mg via ORAL
  Filled 2012-03-21 (×2): qty 2

## 2012-03-21 MED ORDER — PANTOPRAZOLE SODIUM 40 MG PO TBEC
40.0000 mg | DELAYED_RELEASE_TABLET | Freq: Two times a day (BID) | ORAL | Status: DC
  Administered 2012-03-21 – 2012-03-22 (×3): 40 mg via ORAL
  Filled 2012-03-21 (×3): qty 1

## 2012-03-21 MED ORDER — BISMUTH SUBSALICYLATE 262 MG/15ML PO SUSP
30.0000 mL | Freq: Four times a day (QID) | ORAL | Status: DC | PRN
  Filled 2012-03-21: qty 236

## 2012-03-21 MED ORDER — ZIPRASIDONE HCL 20 MG PO CAPS
60.0000 mg | ORAL_CAPSULE | Freq: Two times a day (BID) | ORAL | Status: DC
  Administered 2012-03-22: 60 mg via ORAL
  Filled 2012-03-21 (×2): qty 3

## 2012-03-21 MED ORDER — METOPROLOL TARTRATE 25 MG PO TABS
50.0000 mg | ORAL_TABLET | Freq: Two times a day (BID) | ORAL | Status: DC
Start: 2012-03-21 — End: 2012-03-23
  Administered 2012-03-21 – 2012-03-22 (×3): 50 mg via ORAL
  Filled 2012-03-21 (×3): qty 2

## 2012-03-21 MED ORDER — INSULIN GLARGINE 100 UNIT/ML ~~LOC~~ SOLN: 10.0000 [IU] | Freq: Every day | SUBCUTANEOUS | Status: DC

## 2012-03-21 MED ORDER — ONDANSETRON HCL 8 MG PO TABS: 4.0000 mg | ORAL_TABLET | Freq: Three times a day (TID) | ORAL | Status: DC | PRN

## 2012-03-21 MED ORDER — BENZONATATE 100 MG PO CAPS
100.0000 mg | ORAL_CAPSULE | Freq: Three times a day (TID) | ORAL | Status: DC
  Administered 2012-03-21 – 2012-03-22 (×3): 100 mg via ORAL
  Filled 2012-03-21 (×3): qty 1

## 2012-03-21 NOTE — ED Notes (Signed)
The pts mother has left for the night.  Sitter at the bedside

## 2012-03-21 NOTE — ED Provider Notes (Signed)
History     CSN: [id number]  Arrival date & time 03/18/12  1800   First MD Initiated Contact with Patient 03/18/12 1813      Chief Complaint  Patient presents with  . Hypertension    (Consider location/radiation/quality/duration/timing/severity/associated sxs/prior treatment) HPI  Past Medical History  Diagnosis Date  . Arteriosclerotic cardiovascular disease (ASCVD)     Minimal at cath in [facility].stress nuclear study in 8/08 with nl EF; neg stress echo in 2010  . Diabetes mellitus, type 2 2000    Onset in 2000; no insulin  . Hyperlipidemia   . Hypertension `    during treatment with Geodon  . Gastroesophageal reflux disease     Schatzki's ring  . Anemia, iron deficiency   . Alcohol abuse   . Depression   . Community acquired pneumonia 01/03/10    2011; with [facility]  . Obesity   . Community acquired pneumonia 05/2010  . Schizoaffective disorder     requiring multiple psychiatric admissions  . Dysphagia     Past Surgical History  Procedure Date  . Dilation and curettage, diagnostic / therapeutic 1992  . Esophagogastroduodenoscopy 09/16/08    Dr. Ronni Rumble hiatal hernia/excoriations involving the cardia and mucosa consistent with trauma, antral erosions  of linear petechiae ? gastritis versus early gastric antral vascular  ectasia.Marland Kitchen biopsy showed reactive gastropathy. No H. pylori.  . Esophagogastroduodenoscopy 09/2007    Dr. Rinaldo Ratel ring, dilated to 56 French Maloney dilator, small hiatal hernia, antral erosions, biopsies reactive gastropathy.  Gaspar Bidding dilation 07/17/2011    [facility] sedation-->distal esophageal stricture s/p dilation, chronic gastritis, multiple ulcers in stomach. no h.pylori  . Colonoscopy 01/2006    internal hemorrhoids  . Colonoscopy 01/10/2012    Procedure: COLONOSCOPY;  Surgeon: Corbin Ade, MD;  Location: [facility];  Service: Endoscopy;  Laterality: N/A;  entered cecum @ [phone] ;  total cecal withdrawal time = 8 minutes    Family History  Problem Relation Age of Onset  . Colon cancer Other   . Hypertension Mother   . Stroke Father     deceased at age 97  . Heart disease Sister   . Anesthesia problems Neg Hx   . Hypotension Neg Hx   . Malignant hyperthermia Neg Hx   . Pseudochol deficiency Neg Hx     History  Substance Use Topics  . Smoking status: Current Some Day Smoker -- 1.5 packs/day for 30 years    Types: Cigarettes  . Smokeless tobacco: Not on file     Comment: Sometimes she smokes more; up to 2 packs/day., currently trying to quit (September 2013).  . Alcohol Use: No     Comment: occasional; intake has decreased since she found out she has ulcer, no significant alcohol since April 2013.    OB History    Grav Para Term Preterm Abortions TAB SAB Ect Mult Living                  Review of Systems  Allergies  Metronidazole; Orange; Shrimp; Penicillins; Sulfonamide derivatives; Glipizide; and Sulfamethoxazole w-trimethoprim  Home Medications   Current Outpatient Rx  Name  Route  Sig  Dispense  Refill  . BENZONATATE 100 MG PO CAPS   Oral   Take 1 capsule (100 mg total) by mouth every 8 (eight) hours.   21 capsule   0   . BISMUTH SUBSALICYLATE 262 MG/15ML PO SUSP   Oral   Take 30 mLs by  mouth every 6 (six) hours as needed. Acid indigestion         . CETIRIZINE HCL 10 MG PO TABS   Oral   Take 10 mg by mouth at bedtime.         Marland Kitchen DIPHENHYDRAMINE HCL 25 MG PO TABS   Oral   Take 50 mg by mouth daily as needed. Sleep/allergies         . DIPHENOXYLATE-ATROPINE 2.5-0.025 MG PO TABS   Oral   Take 1 tablet by mouth 4 (four) times daily as needed for diarrhea or loose stools.   30 tablet   0   . FERROUS GLUCONATE 240 (27 FE) MG PO TABS   Oral   Take 240 mg by mouth 2 (two) times daily.         Marland Kitchen HYDROCHLOROTHIAZIDE 25 MG PO TABS   Oral   Take 1 tablet (25 mg total) by mouth daily.   30 tablet   6   . IBUPROFEN 200 MG PO  TABS   Oral   Take 400 mg by mouth every 8 (eight) hours as needed. Aches and pains, fever         . INSULIN GLARGINE 100 UNIT/ML [place] SOLN   Subcutaneous   Inject 10-24 Units into the skin at bedtime. Checks sugar before bedtime, dose depends on results   10 mL   3   . LORAZEPAM 0.5 MG PO TABS   Oral   Take 1-2 tablets (0.5-1 mg total) by mouth 2 (two) times daily as needed. For anxiety   90 tablet   0   . METFORMIN HCL 1000 MG PO TABS   Oral   Take 1 tablet (1,000 mg total) by mouth 2 (two) times daily with a meal.   62 tablet   3   . METOPROLOL TARTRATE 50 MG PO TABS   Oral   Take 1 tablet (50 mg total) by mouth 2 (two) times daily.   62 tablet   11   . MEVACOR 20 MG PO TABS      TAKE (1) TABLET BY MOUTH AT BEDTIME.   90 tablet   1   . MOMETASONE FUROATE 50 MCG/ACT NA SUSP      2 sprays in each nostril daily   17 g   12   . PANTOPRAZOLE SODIUM 40 MG PO TBEC   Oral   Take 40 mg by mouth 2 (two) times daily.          Marland Kitchen POTASSIUM BICARBONATE PO   Oral   Take 1 tablet by mouth daily. Over the counter medication         . TRAZODONE HCL 100 MG PO TABS   Oral   Take 1 tablet (100 mg total) by mouth at bedtime.         Marland Kitchen ZIPRASIDONE HCL 60 MG PO CAPS   Oral   Take 60 mg by mouth 2 (two) times daily with a meal.            BP 154/91  Pulse 101  Temp 97.9 F (36.6 C) (Oral)  Resp 18  Ht 5\' 4"  (1.626 m)  Wt 190 lb (86.183 kg)  BMI 32.61 kg/m2  SpO2 99%  LMP 02/17/2012  Physical Exam  ED Course  Procedures (including critical care time)  Labs Reviewed - No data to display No results found.   No diagnosis found.    MDM  Patient not seen by physician  Donnetta Hutching, MD 03/21/12 [phone]

## 2012-03-21 NOTE — ED Provider Notes (Signed)
History   This chart was scribed for Glynn Octave, MD by Toya Smothers, ED Scribe. The patient was seen in room C29C/C29C. Patient's care was started at 1757.  CSN: [id number]  Arrival date & time 03/21/12  1757   First MD Initiated Contact with Patient 03/21/12 1823      Chief Complaint  Patient presents with  . Suicidal   HPI  [name] is a 46 y.o. female with h/o DM, HTN, Gastroesophageal reflux, pneumonia, and schizoaffective disorder, who presents to the Emergency Department complaining of prolonged suicidal ideations, depression, and a suiccide attempt 4 days ago. Pt reports taking 3 25 mg hydrochlorothiazide pills, drinking bleach, and cutting her wrists. Shortly after drinking bleach Pt regurgitated abdominal content. She does not discus the context or reason for suicidal ideation and self injury, but denies homicidal ideation. Pt reports going to [facility] for psych evaluation 2 days ago, but leaving before evaluation. No pain, sore throat, difficulty swallowing. Pt is a current everyday smoker, admits alcohol use (last 2 days ago), and denies illicit drug use.  iabeted, high blood pressure. Depression. Previous hospitilization     Past Medical History  Diagnosis Date  . Arteriosclerotic cardiovascular disease (ASCVD)     Minimal at cath in [facility].stress nuclear study in 8/08 with nl EF; neg stress echo in 2010  . Diabetes mellitus, type 2 2000    Onset in 2000; no insulin  . Hyperlipidemia   . Hypertension `    during treatment with Geodon  . Gastroesophageal reflux disease     Schatzki's ring  . Anemia, iron deficiency   . Alcohol abuse   . Depression   . Community acquired pneumonia 01/03/10    2011; with pleural effusion-[facility] acute resp failure  . Obesity   . Community acquired pneumonia 05/2010  . Schizoaffective disorder     requiring multiple psychiatric admissions  . Dysphagia     Past Surgical History  Procedure Date  .  Dilation and curettage, diagnostic / therapeutic 1992  . Esophagogastroduodenoscopy 09/16/08    Dr. Ronni Rumble hiatal hernia/excoriations involving the cardia and mucosa consistent with trauma, antral erosions  of linear petechiae ? gastritis versus early gastric antral vascular  ectasia.Marland Kitchen biopsy showed reactive gastropathy. No H. pylori.  . Esophagogastroduodenoscopy 09/2007    Dr. Rinaldo Ratel ring, dilated to 56 French Maloney dilator, small hiatal hernia, antral erosions, biopsies reactive gastropathy.  Gaspar Bidding dilation 07/17/2011    [facility] sedation-->distal esophageal stricture s/p dilation, chronic gastritis, multiple ulcers in stomach. no h.pylori  . Colonoscopy 01/2006    internal hemorrhoids  . Colonoscopy 01/10/2012    Procedure: COLONOSCOPY;  Surgeon: Corbin Ade, MD;  Location: [facility];  Service: Endoscopy;  Laterality: N/A;  entered cecum @ [phone] ; total cecal withdrawal time = 8 minutes    Family History  Problem Relation Age of Onset  . Colon cancer Other   . Hypertension Mother   . Stroke Father     deceased at age 81  . Heart disease Sister   . Anesthesia problems Neg Hx   . Hypotension Neg Hx   . Malignant hyperthermia Neg Hx   . Pseudochol deficiency Neg Hx     History  Substance Use Topics  . Smoking status: Current Some Day Smoker -- 1.5 packs/day for 30 years    Types: Cigarettes  . Smokeless tobacco: Not on file     Comment: Sometimes she smokes more; up to 2 packs/day., currently trying  to quit (September 2013).  . Alcohol Use: No     Comment: occasional; intake has decreased since she found out she has ulcer, no significant alcohol since April 2013.    Review of Systems  Psychiatric/Behavioral: Positive for suicidal ideas, self-injury and dysphoric mood.  All other systems reviewed and are negative.    Allergies  Metronidazole; Orange; Shrimp; Penicillins; Sulfonamide derivatives; Glipizide; and Sulfamethoxazole w-trimethoprim  Home  Medications   Current Outpatient Rx  Name  Route  Sig  Dispense  Refill  . BENZONATATE 100 MG PO CAPS   Oral   Take 1 capsule (100 mg total) by mouth every 8 (eight) hours.   21 capsule   0   . BISMUTH SUBSALICYLATE 262 MG/15ML PO SUSP   Oral   Take 30 mLs by mouth every 6 (six) hours as needed. Acid indigestion         . CETIRIZINE HCL 10 MG PO TABS   Oral   Take 10 mg by mouth at bedtime.         Marland Kitchen DIPHENHYDRAMINE HCL 25 MG PO TABS   Oral   Take 50 mg by mouth daily as needed. Sleep/allergies         . FERROUS GLUCONATE 240 (27 FE) MG PO TABS   Oral   Take 240 mg by mouth 2 (two) times daily.         . IBUPROFEN 200 MG PO TABS   Oral   Take 400 mg by mouth every 8 (eight) hours as needed. Aches and pains, fever         . INSULIN GLARGINE 100 UNIT/ML [place] SOLN   Subcutaneous   Inject 10-24 Units into the skin at bedtime. Checks sugar before bedtime, dose depends on results   10 mL   3   . LORAZEPAM 0.5 MG PO TABS   Oral   Take 1-2 tablets (0.5-1 mg total) by mouth 2 (two) times daily as needed. For anxiety   90 tablet   0   . METFORMIN HCL 1000 MG PO TABS   Oral   Take 1 tablet (1,000 mg total) by mouth 2 (two) times daily with a meal.   62 tablet   3   . METOPROLOL TARTRATE 50 MG PO TABS   Oral   Take 1 tablet (50 mg total) by mouth 2 (two) times daily.   62 tablet   11   . MEVACOR 20 MG PO TABS      TAKE (1) TABLET BY MOUTH AT BEDTIME.   90 tablet   1   . MOMETASONE FUROATE 50 MCG/ACT NA SUSP      2 sprays in each nostril daily   17 g   12   . PANTOPRAZOLE SODIUM 40 MG PO TBEC   Oral   Take 40 mg by mouth 2 (two) times daily.          Marland Kitchen POTASSIUM BICARBONATE PO   Oral   Take 1 tablet by mouth daily. Over the counter medication         . TRAZODONE HCL 100 MG PO TABS   Oral   Take 1 tablet (100 mg total) by mouth at bedtime.         Marland Kitchen ZIPRASIDONE HCL 60 MG PO CAPS   Oral   Take 60 mg by mouth 2 (two) times daily with a  meal.          . HYDROCHLOROTHIAZIDE 25 MG PO TABS  Oral   Take 1 tablet (25 mg total) by mouth daily.   30 tablet   6     BP 133/88  Pulse 87  Temp 98.2 F (36.8 C) (Oral)  Resp 19  SpO2 97%  LMP 02/17/2012  Physical Exam  Nursing note and vitals reviewed. Constitutional: She appears well-developed and well-nourished.  HENT:  Head: Normocephalic and atraumatic.  Eyes: Conjunctivae normal are normal. Pupils are equal, round, and reactive to light.  Neck: Neck supple. No tracheal deviation present. No thyromegaly present.  Cardiovascular: Normal rate and regular rhythm.   No murmur heard. Pulmonary/Chest: Effort normal and breath sounds normal.  Abdominal: Soft. Bowel sounds are normal. She exhibits no distension. There is no tenderness.  Musculoskeletal: Normal range of motion. She exhibits no edema and no tenderness.  Neurological: She is alert. Coordination normal.       No clonus. 5/5 strength throughout.  Skin: Skin is warm and dry. No rash noted.    ED Course  Procedures COORDINATION OF CARE: 18:55- Evaluated Pt. Pt is awake, alert, and without distress.    Labs Reviewed  COMPREHENSIVE METABOLIC PANEL - Abnormal; Notable for the following:    Sodium 134 (*)     Glucose, Bld 169 (*)     Total Bilirubin 0.2 (*)     GFR calc non Af Amer 66 (*)     GFR calc Af Amer 76 (*)     All other components within normal limits  SALICYLATE LEVEL - Abnormal; Notable for the following:    Salicylate Lvl <2.0 (*)     All other components within normal limits  GLUCOSE, CAPILLARY - Abnormal; Notable for the following:    Glucose-Capillary 157 (*)     All other components within normal limits  GLUCOSE, CAPILLARY - Abnormal; Notable for the following:    Glucose-Capillary 165 (*)     All other components within normal limits  CBC WITH DIFFERENTIAL  ACETAMINOPHEN LEVEL  ETHANOL  URINE RAPID DRUG SCREEN (HOSP PERFORMED)   No results found.   1. Suicidal ideation    2. Type II or unspecified type diabetes mellitus without mention of complication, not stated as uncontrolled   3. DEPRESSION       MDM  Suicide attempt 4 days ago with drinking bleach and taking ~30 pills of 25 mg HCTZ.  Went to [facility] 3 days ago but left before being seen. States mouthful of bleach made her vomit the pills.  Still feels suicidal. No abdominal pain, nausea, vomiting, difficulty swallowing.  Screening labs, EKG, UDS. D/w ACT team   Date: 03/21/2012  Rate: 103  Rhythm: sinus tachycardia  QRS Axis: normal  Intervals: normal  ST/T Wave abnormalities: normal  Conduction Disutrbances:none  Narrative Interpretation: septal Q waves unchanged  Old EKG Reviewed: unchanged      I personally performed the services described in this documentation, which was scribed in my presence. The recorded information has been reviewed and is accurate.      Glynn Octave, MD 03/22/12 1141

## 2012-03-21 NOTE — ED Notes (Signed)
Pt. States attempting to kill herself 4 days ago took a bottle of HCTZ appx 30 pills, then ingested appx 1/2 cup of bleach. And begin to vomit. Pt. Also sts cut left wrist. Scar visible. Pt states calling poison control yesterday and was told to take Mylanta. Pt. Has not taken Mylanta. Pt. States sometimes skin on back and arms feel like they are burning but currently denies pain.

## 2012-03-21 NOTE — ED Notes (Signed)
The pts sugar has been checked bu the pt is not eating.  Dr Rhunette Croft orders do not give the insulin until the pt is  Eating.  Doses for tonight  Not given

## 2012-03-21 NOTE — ED Notes (Signed)
Pt given ativan and a nicotin patch was placed on her rt upper arm

## 2012-03-21 NOTE — ED Notes (Signed)
The pt was moved from fast track to pod c with si.  The pt is a pt at [facility] in [place].  No physical pain.  She reports that she is supposed to be taking geodon 60mg  bid but she has been taking extra pills without clearance from her doctor.  She has scrubs on  Already and her mother is at her bedside.  The pt reports that she is very anxious.

## 2012-03-21 NOTE — ED Notes (Signed)
MD at bedside. Pt undressed and in blue scrubs. Pt underwear and pad on due to menstruating. Press photographer and house coverage notified.

## 2012-03-21 NOTE — ED Notes (Signed)
Pt and visitor wanded

## 2012-03-21 NOTE — ED Notes (Signed)
Per pt 4 days ago she cut her wrists, drank bleach and took a bunch of pills to kill herself. She wont talk about why.

## 2012-03-22 ENCOUNTER — Encounter (HOSPITAL_COMMUNITY): Payer: Self-pay

## 2012-03-22 LAB — RAPID URINE DRUG SCREEN, HOSP PERFORMED: Opiates: POSITIVE — AB

## 2012-03-22 LAB — GLUCOSE, CAPILLARY
Glucose-Capillary: 254 mg/dL — ABNORMAL HIGH (ref 70–99)
Glucose-Capillary: 97 mg/dL (ref 70–99)
Glucose-Capillary: 119 mg/dL — ABNORMAL HIGH (ref 70–99)

## 2012-03-22 MED ORDER — ZIPRASIDONE HCL 20 MG PO CAPS
80.0000 mg | ORAL_CAPSULE | Freq: Two times a day (BID) | ORAL | Status: DC
  Administered 2012-03-22: 80 mg via ORAL
  Filled 2012-03-22: qty 1

## 2012-03-22 MED ORDER — DIVALPROEX SODIUM 250 MG PO DR TAB
500.0000 mg | DELAYED_RELEASE_TABLET | Freq: Two times a day (BID) | ORAL | Status: DC
  Administered 2012-03-22 (×2): 500 mg via ORAL
  Filled 2012-03-22 (×2): qty 2

## 2012-03-22 MED ORDER — ZOLPIDEM TARTRATE 5 MG PO TABS
10.0000 mg | ORAL_TABLET | Freq: Every evening | ORAL | Status: DC | PRN
  Administered 2012-03-22: 10 mg via ORAL
  Filled 2012-03-22: qty 2

## 2012-03-22 MED ORDER — INSULIN GLARGINE 100 UNIT/ML ~~LOC~~ SOLN
10.0000 [IU] | Freq: Every day | SUBCUTANEOUS | Status: DC
  Filled 2012-03-22: qty 1

## 2012-03-22 NOTE — ED Notes (Signed)
Act team in to see

## 2012-03-22 NOTE — ED Notes (Signed)
The pt reports that she cannot sleep due to the noise over her head

## 2012-03-22 NOTE — BH Assessment (Signed)
[facility] Assessment Progress Note      Attempted to assess patient, but she was unable to keep her eyes open and requested to complete the assessment at a later time.

## 2012-03-22 NOTE — [facility] Counselor (Signed)
Staff met with pt. And pt. Continues to endorse SI ideation, thoughts.  Pt. Refuses to contract for safety.  Staff consulted with Dr. Ignacia Palma and Telepsych has been ordered.  [facility] disposition pending Telepsych recommendation.

## 2012-03-22 NOTE — ED Notes (Signed)
Pt A.O. X 4.  Respirations even and regular. NAD. Pt reports anxiety 9/10. Denies pain. Denies N/V/D/C. Denies SOB. Sitter at bedside.

## 2012-03-22 NOTE — ED Notes (Signed)
[name], ACT Team at bedside  

## 2012-03-22 NOTE — ED Notes (Signed)
Telepsych placed in room for pt.

## 2012-03-22 NOTE — ED Notes (Signed)
Pt sleeping sitter at bedside. 

## 2012-03-22 NOTE — ED Notes (Signed)
The daughter cannot spend the night here with her.  Pt given sleeping meds given a sprite.  Asked to hang up the telephone so the med can work

## 2012-03-22 NOTE — ED Notes (Signed)
Pt talking on the phone to her daughter asking if her daughter could spend the night here with her tonight.

## 2012-03-22 NOTE — ED Notes (Signed)
The pt is alert med given.  She last ate earlier today.  She does not want any food now.  Unable to give the lantus unless she eats.  This information given to the pt

## 2012-03-22 NOTE — ED Notes (Addendum)
Pt A.O.x 4. Laying down in bed visiting with family. Denies SI. Denies HI. Denies physical pain. Denies N/V/D/C.  Respirations even and regular. Pleasant affect. Calm, cooperative. Reports anxiety 6/10. Sitter at bedside.

## 2012-03-22 NOTE — ED Notes (Signed)
Regular lunch order placed.

## 2012-03-22 NOTE — Progress Notes (Signed)
2:07 PM Dr. Henderson Cloud, psychiatrist with [facility] did consult.  He recommended Geodon80 mg bid and Depakote 500 mg bid,  And recommended psychiatric hospitalization.

## 2012-03-22 NOTE — BH Assessment (Signed)
Assessment Note   [name] is an 46 y.o. female who presents to [facility] with suicidal ideation and reports she made a suicide attempt 3 days ago in which she took HCTZ, drank bleach, and cut her wrist.  She only told someone about that attempt today.  She states she has been noticing htat her depression and anxiety have been worsening and states one of her triggers is when people follow her and get in her business and she's been having some trouble with people in her neighborhood.  She reportedly is seen outpatient at [facility] and did notify them of her worsenign symptoms but did not admit to increasing persistent suicidal thoughts because she did not want to be hospitalized.  However, she currently admits that she cannot control these feelings and thoughts on her own-she attempted to increase the dosages of her medications on her own, but was not successful.  She is appropriate for inpatient admission adn will be reviewed at [facility].  Axis I: Mood Disorder NOS Axis II: Deferred Axis III:  Past Medical History  Diagnosis Date  . Arteriosclerotic cardiovascular disease (ASCVD)     Minimal at cath in [facility].stress nuclear study in 8/08 with nl EF; neg stress echo in 2010  . Diabetes mellitus, type 2 2000    Onset in 2000; no insulin  . Hyperlipidemia   . Hypertension `    during treatment with Geodon  . Gastroesophageal reflux disease     Schatzki's ring  . Anemia, iron deficiency   . Alcohol abuse   . Depression   . Community acquired pneumonia 01/03/10    2011; with pleural effusion[facility] acute resp failure  . Obesity   . Community acquired pneumonia 05/2010  . Schizoaffective disorder     requiring multiple psychiatric admissions  . Dysphagia    Axis IV: housing problems, problems related to social environment and problems with primary support group Axis V: 31-40 impairment in reality testing  Past Medical History:  Past Medical History  Diagnosis Date  .  Arteriosclerotic cardiovascular disease (ASCVD)     Minimal at cath in [facility].stress nuclear study in 8/08 with nl EF; neg stress echo in 2010  . Diabetes mellitus, type 2 2000    Onset in 2000; no insulin  . Hyperlipidemia   . Hypertension `    during treatment with Geodon  . Gastroesophageal reflux disease     Schatzki's ring  . Anemia, iron deficiency   . Alcohol abuse   . Depression   . Community acquired pneumonia 01/03/10    2011; with pleural effusion[facility] acute resp failure  . Obesity   . Community acquired pneumonia 05/2010  . Schizoaffective disorder     requiring multiple psychiatric admissions  . Dysphagia     Past Surgical History  Procedure Date  . Dilation and curettage, diagnostic / therapeutic 1992  . Esophagogastroduodenoscopy 09/16/08    Dr. Ronni Rumble hiatal hernia/excoriations involving the cardia and mucosa consistent with trauma, antral erosions  of linear petechiae ? gastritis versus early gastric antral vascular  ectasia.Marland Kitchen biopsy showed reactive gastropathy. No H. pylori.  . Esophagogastroduodenoscopy 09/2007    Dr. Rinaldo Ratel ring, dilated to 56 French Maloney dilator, small hiatal hernia, antral erosions, biopsies reactive gastropathy.  Gaspar Bidding dilation 07/17/2011    [facility] sedation-->distal esophageal stricture s/p dilation, chronic gastritis, multiple ulcers in stomach. no h.pylori  . Colonoscopy 01/2006    internal hemorrhoids  . Colonoscopy 01/10/2012    Procedure:  COLONOSCOPY;  Surgeon: Corbin Ade, MD;  Location: [facility];  Service: Endoscopy;  Laterality: N/A;  entered cecum @ [phone] ; total cecal withdrawal time = 8 minutes    Family History:  Family History  Problem Relation Age of Onset  . Colon cancer Other   . Hypertension Mother   . Stroke Father     deceased at age 31  . Heart disease Sister   . Anesthesia problems Neg Hx   . Hypotension Neg Hx   . Malignant hyperthermia Neg Hx   . Pseudochol deficiency  Neg Hx     Social History:  reports that she has been smoking Cigarettes.  She has a 45 pack-year smoking history. She does not have any smokeless tobacco history on file. She reports that she does not drink alcohol or use illicit drugs.  Additional Social History:  Alcohol / Drug Use History of alcohol / drug use?: No history of alcohol / drug abuse  CIWA: CIWA-Ar BP: 133/88 mmHg Pulse Rate: 87  COWS:    Allergies:  Allergies  Allergen Reactions  . Metronidazole Shortness Of Breath and Swelling  . Orange Itching  . Shrimp (Shellfish Allergy) Shortness Of Breath and Itching    Takes Benadryl before eating shrimp.  Marland Kitchen Penicillins Hives and Swelling    Fever as well  . Sulfonamide Derivatives Hives    fever  . Glipizide Other (See Comments)    psychosis  . Sulfamethoxazole W-Trimethoprim Rash    Home Medications:  (Not in a hospital admission)  OB/GYN Status:  Patient's last menstrual period was 02/17/2012.  General Assessment Data Location of Assessment: [facility] ED Living Arrangements: Alone Can pt return to current living arrangement?: Yes Admission Status: Voluntary Is patient capable of signing voluntary admission?: Yes Transfer from: Acute Hospital Referral Source: Self/Family/Friend  Education Status Is patient currently in school?: No Highest grade of school patient has completed: college  Risk to self Suicidal Ideation: Yes-Currently Present Suicidal Intent: No-Not Currently/Within Last 6 Months Is patient at risk for suicide?: Yes Suicidal Plan?: Yes-Currently Present Specify Current Suicidal Plan: overdose, drink bleach, cut wrists Access to Means: Yes Specify Access to Suicidal Means: bleach, sharps, pills What has been your use of drugs/alcohol within the last 12 months?: n/a Previous Attempts/Gestures: Yes How many times?: 9  (most recent 3 days ago) Triggers for Past Attempts: Hallucinations;Other (Comment) (depression) Intentional Self Injurious  Behavior: None Family Suicide History: No Recent stressful life event(s): Other (Comment);Conflict (Comment);Turmoil (Comment) (feels people in her neighborhood are following her, in her b) Persecutory voices/beliefs?: Yes Depression: Yes Depression Symptoms: Despondent;Isolating;Fatigue;Guilt;Loss of interest in usual pleasures;Feeling worthless/self pity;Feeling angry/irritable;Insomnia Substance abuse history and/or treatment for substance abuse?: No Suicide prevention information given to non-admitted patients: Not applicable  Risk to Others Homicidal Ideation: No Thoughts of Harm to Others: No Current Homicidal Intent: No Current Homicidal Plan: No Access to Homicidal Means: No History of harm to others?: No Assessment of Violence: None Noted Does patient have access to weapons?: No Criminal Charges Pending?: No Does patient have a court date: No  Psychosis Hallucinations: None noted Delusions: Persecutory (reports ppl in neighborhood out to get her-unclear whether t)  Mental Status Report Appear/Hygiene: Disheveled Eye Contact: Fair Motor Activity: Freedom of movement Speech: Logical/coherent Level of Consciousness: Quiet/awake Mood: Depressed;Anxious Affect: Appropriate to circumstance Anxiety Level: Panic Attacks Panic attack frequency: daily Most recent panic attack: yesterday Thought Processes: Coherent;Relevant Judgement: Impaired Orientation: Person;Place;Time;Situation Obsessive Compulsive Thoughts/Behaviors: Moderate  Cognitive Functioning Concentration: Decreased Memory: Recent  Intact;Remote Intact IQ: Average Insight: Fair Impulse Control: Poor Appetite: Poor Weight Loss:  (unk) Weight Gain: 0  Sleep: Decreased Total Hours of Sleep: 3  Vegetative Symptoms: None  ADLScreening [facility] Assessment Services) Patient's cognitive ability adequate to safely complete daily activities?: Yes Patient able to express need for assistance with ADLs?:  Yes Independently performs ADLs?: Yes (appropriate for developmental age)  Abuse/Neglect [facility]) Physical Abuse: Denies Verbal Abuse: Denies Sexual Abuse: Denies  Prior Inpatient Therapy Prior Inpatient Therapy: Yes Prior Therapy Dates: 2011 Prior Therapy Facilty/Provider(s): [facility] Reason for Treatment: Suicidal Ideation  Prior Outpatient Therapy Prior Outpatient Therapy: Yes Prior Therapy Dates: Current Prior Therapy Facilty/Provider(s): [facility] Reason for Treatment: , anxiety  ADL Screening (condition at time of admission) Patient's cognitive ability adequate to safely complete daily activities?: Yes Patient able to express need for assistance with ADLs?: Yes Independently performs ADLs?: Yes (appropriate for developmental age) Weakness of Legs: None Weakness of Arms/Hands: None       Abuse/Neglect Assessment (Assessment to be complete while patient is alone) Physical Abuse: Denies Verbal Abuse: Denies Sexual Abuse: Denies Exploitation of patient/patient's resources: Denies Self-Neglect: Denies     Merchant navy officer (For Healthcare) Advance Directive: Patient does not have advance directive;Patient would not like information Pre-existing out of facility DNR order (yellow form or pink MOST form): No Nutrition Screen- [facility] Adult/[facility] Patient's home diet: Regular Have you recently lost weight without trying?: No Have you been eating poorly because of a decreased appetite?: No Malnutrition Screening Tool Score: 0   Additional Information 1:1 In Past 12 Months?: No CIRT Risk: No Elopement Risk: No Does patient have medical clearance?: Yes     Disposition:  Disposition Disposition of Patient: Inpatient treatment program Type of inpatient treatment program: Adult  On Site Evaluation by:  Rancour Reviewed with Physician:     Steward Ros 03/22/2012 7:07 AM

## 2012-03-22 NOTE — ED Notes (Signed)
The pt still cannot produce a urine

## 2012-03-22 NOTE — Progress Notes (Signed)
12:25 PM Continues to be depressed with suicidal thoughts.  Telepsychiatry consult requested at request of ACT counselor.

## 2012-03-23 ENCOUNTER — Encounter (HOSPITAL_COMMUNITY): Payer: Self-pay | Admitting: *Deleted

## 2012-03-23 ENCOUNTER — Inpatient Hospital Stay (HOSPITAL_COMMUNITY)
Admission: AD | Admit: 2012-03-23 | Discharge: 2012-03-26 | DRG: 885 | Disposition: A | Discharge status: Home / Self Care | Payer: Medicare Other | Source: Other Acute Inpatient Hospital | Attending: Psychiatry | Admitting: Psychiatry

## 2012-03-23 DIAGNOSIS — F17200 Nicotine dependence, unspecified, uncomplicated: Secondary | ICD-10-CM | POA: Diagnosis not present

## 2012-03-23 DIAGNOSIS — F259 Schizoaffective disorder, unspecified: Secondary | ICD-10-CM | POA: Diagnosis not present

## 2012-03-23 DIAGNOSIS — E785 Hyperlipidemia, unspecified: Secondary | ICD-10-CM | POA: Diagnosis present

## 2012-03-23 DIAGNOSIS — E119 Type 2 diabetes mellitus without complications: Secondary | ICD-10-CM | POA: Diagnosis not present

## 2012-03-23 DIAGNOSIS — F1011 Alcohol abuse, in remission: Secondary | ICD-10-CM

## 2012-03-23 DIAGNOSIS — D509 Iron deficiency anemia, unspecified: Secondary | ICD-10-CM

## 2012-03-23 DIAGNOSIS — F3130 Bipolar disorder, current episode depressed, mild or moderate severity, unspecified: Secondary | ICD-10-CM | POA: Diagnosis not present

## 2012-03-23 DIAGNOSIS — F319 Bipolar disorder, unspecified: Secondary | ICD-10-CM | POA: Diagnosis not present

## 2012-03-23 DIAGNOSIS — F3160 Bipolar disorder, current episode mixed, unspecified: Secondary | ICD-10-CM | POA: Diagnosis not present

## 2012-03-23 DIAGNOSIS — F411 Generalized anxiety disorder: Secondary | ICD-10-CM | POA: Diagnosis present

## 2012-03-23 DIAGNOSIS — N920 Excessive and frequent menstruation with regular cycle: Secondary | ICD-10-CM

## 2012-03-23 DIAGNOSIS — F39 Unspecified mood [affective] disorder: Secondary | ICD-10-CM | POA: Diagnosis present

## 2012-03-23 DIAGNOSIS — E669 Obesity, unspecified: Secondary | ICD-10-CM | POA: Diagnosis present

## 2012-03-23 DIAGNOSIS — Z79899 Other long term (current) drug therapy: Secondary | ICD-10-CM

## 2012-03-23 DIAGNOSIS — F329 Major depressive disorder, single episode, unspecified: Secondary | ICD-10-CM

## 2012-03-23 DIAGNOSIS — J3489 Other specified disorders of nose and nasal sinuses: Secondary | ICD-10-CM

## 2012-03-23 DIAGNOSIS — K219 Gastro-esophageal reflux disease without esophagitis: Secondary | ICD-10-CM | POA: Diagnosis present

## 2012-03-23 DIAGNOSIS — I10 Essential (primary) hypertension: Secondary | ICD-10-CM | POA: Diagnosis not present

## 2012-03-23 DIAGNOSIS — I2510 Atherosclerotic heart disease of native coronary artery without angina pectoris: Secondary | ICD-10-CM | POA: Diagnosis not present

## 2012-03-23 DIAGNOSIS — I5030 Unspecified diastolic (congestive) heart failure: Secondary | ICD-10-CM

## 2012-03-23 DIAGNOSIS — R197 Diarrhea, unspecified: Secondary | ICD-10-CM

## 2012-03-23 DIAGNOSIS — J309 Allergic rhinitis, unspecified: Secondary | ICD-10-CM

## 2012-03-23 DIAGNOSIS — R1310 Dysphagia, unspecified: Secondary | ICD-10-CM

## 2012-03-23 DIAGNOSIS — K625 Hemorrhage of anus and rectum: Secondary | ICD-10-CM

## 2012-03-23 DIAGNOSIS — R454 Irritability and anger: Secondary | ICD-10-CM

## 2012-03-23 DIAGNOSIS — R140 Abdominal distension (gaseous): Secondary | ICD-10-CM

## 2012-03-23 LAB — GLUCOSE, CAPILLARY
Glucose-Capillary: 123 mg/dL — ABNORMAL HIGH (ref 70–99)
Glucose-Capillary: 119 mg/dL — ABNORMAL HIGH (ref 70–99)

## 2012-03-23 MED ORDER — HYDROXYZINE HCL 25 MG PO TABS
25.0000 mg | ORAL_TABLET | Freq: Three times a day (TID) | ORAL | Status: DC | PRN
  Administered 2012-03-23 – 2012-03-24 (×4): 25 mg via ORAL

## 2012-03-23 MED ORDER — ACETAMINOPHEN 325 MG PO TABS: 650.0000 mg | ORAL_TABLET | Freq: Four times a day (QID) | ORAL | Status: DC | PRN

## 2012-03-23 MED ORDER — METFORMIN HCL 500 MG PO TABS
1000.0000 mg | ORAL_TABLET | Freq: Two times a day (BID) | ORAL | Status: DC
Start: 2012-03-23 — End: 2012-03-26
  Administered 2012-03-23 – 2012-03-26 (×6): 1000 mg via ORAL
  Filled 2012-03-23 (×8): qty 2

## 2012-03-23 MED ORDER — DIVALPROEX SODIUM 250 MG PO DR TAB
250.0000 mg | DELAYED_RELEASE_TABLET | Freq: Once | ORAL | Status: AC
  Administered 2012-03-23: 250 mg via ORAL
  Filled 2012-03-23: qty 1

## 2012-03-23 MED ORDER — MAGNESIUM HYDROXIDE 400 MG/5ML PO SUSP: 30.0000 mL | Freq: Every day | ORAL | Status: DC | PRN

## 2012-03-23 MED ORDER — PANTOPRAZOLE SODIUM 40 MG PO TBEC
40.0000 mg | DELAYED_RELEASE_TABLET | Freq: Two times a day (BID) | ORAL | Status: DC
  Administered 2012-03-23 – 2012-03-26 (×6): 40 mg via ORAL
  Filled 2012-03-23 (×8): qty 1

## 2012-03-23 MED ORDER — NICOTINE 21 MG/24HR TD PT24
21.0000 mg | MEDICATED_PATCH | Freq: Every day | TRANSDERMAL | Status: DC
  Administered 2012-03-23: 21 mg via TRANSDERMAL
  Filled 2012-03-23 (×4): qty 1

## 2012-03-23 MED ORDER — DIVALPROEX SODIUM ER 500 MG PO TB24
500.0000 mg | ORAL_TABLET | Freq: Every day | ORAL | Status: DC
  Administered 2012-03-23 – 2012-03-25 (×3): 500 mg via ORAL
  Filled 2012-03-23 (×4): qty 1

## 2012-03-23 MED ORDER — METFORMIN HCL 500 MG PO TABS
500.0000 mg | ORAL_TABLET | Freq: Two times a day (BID) | ORAL | Status: DC
  Administered 2012-03-23: 500 mg via ORAL
  Filled 2012-03-23 (×6): qty 1

## 2012-03-23 MED ORDER — METOPROLOL TARTRATE 50 MG PO TABS
50.0000 mg | ORAL_TABLET | Freq: Two times a day (BID) | ORAL | Status: DC
  Administered 2012-03-23 – 2012-03-26 (×6): 50 mg via ORAL
  Filled 2012-03-23 (×8): qty 1

## 2012-03-23 MED ORDER — NICOTINE 21 MG/24HR TD PT24
MEDICATED_PATCH | TRANSDERMAL | Status: AC
  Filled 2012-03-23: qty 1

## 2012-03-23 MED ORDER — FERROUS GLUCONATE 240 (27 FE) MG PO TABS: 240.0000 mg | ORAL_TABLET | Freq: Two times a day (BID) | ORAL | Status: DC

## 2012-03-23 MED ORDER — MIRTAZAPINE 15 MG PO TABS
15.0000 mg | ORAL_TABLET | Freq: Every day | ORAL | Status: DC
  Administered 2012-03-23 – 2012-03-25 (×3): 15 mg via ORAL
  Filled 2012-03-23 (×4): qty 1

## 2012-03-23 MED ORDER — INSULIN GLARGINE 100 UNIT/ML ~~LOC~~ SOLN
10.0000 [IU] | Freq: Every day | SUBCUTANEOUS | Status: DC
  Administered 2012-03-24 – 2012-03-25 (×2): 10 [IU] via SUBCUTANEOUS

## 2012-03-23 MED ORDER — METFORMIN HCL 500 MG PO TABS
500.0000 mg | ORAL_TABLET | Freq: Once | ORAL | Status: AC
  Administered 2012-03-23: 500 mg via ORAL
  Filled 2012-03-23: qty 1

## 2012-03-23 MED ORDER — FERROUS GLUCONATE 324 (38 FE) MG PO TABS
324.0000 mg | ORAL_TABLET | Freq: Two times a day (BID) | ORAL | Status: DC
  Administered 2012-03-23 – 2012-03-26 (×6): 324 mg via ORAL
  Filled 2012-03-23 (×8): qty 1

## 2012-03-23 MED ORDER — ALUM & MAG HYDROXIDE-SIMETH 200-200-20 MG/5ML PO SUSP: 30.0000 mL | ORAL | Status: DC | PRN

## 2012-03-23 MED ORDER — TRAZODONE HCL 50 MG PO TABS: 50.0000 mg | ORAL_TABLET | Freq: Every evening | ORAL | Status: DC | PRN

## 2012-03-23 NOTE — Progress Notes (Signed)
Pt at nurses' station asking for hs meds now. She reports depression, sadness and worry that "people are trying to hurt me and my family. I know I'm just paranoid." She states she cannot tolerate group at this time. Explained to pt med times. Gave pt support and reassurance. Pt states if she were at home she is uncertain whether she could be safe but states that while here she will not attempt to harm herself. She agrees to come to staff should that status change. Denies HI/AVH. Will continue to monitor closely. Lawrence Marseilles

## 2012-03-23 NOTE — Progress Notes (Signed)
Patient ID: [name], female   DOB: December 19, 1965, 46 y.o.   MRN: [id number] Pt was received ambulatory from the [facility], she came to the ER after taking 30 HCTZ, chased by 1/2 cup of bleach.  She reported she called Poison Control, they told her to take Mylanta which she states she didn't do, but has had a cough since taking the bleach.  She also said she had slit her wrists, but no fresh sounds noted.  Stated she was taking Geodon 60 mg and some "extra pills", UDS pos  for opiates.  States has been struggling financially, no job, on disability, but lives in an old house that a family member had had, so has housing.  States has had a lot of medical issues and just gets discouraged, feels like she doesn't want to live anymore, but currently denies feeling suicidal and is glad she's here for help.   Was oriented to the unit, rules, offered a drink, went to bed.  Will continue to monitor q 15 minutes for safety.Very tired as she took hs meds at the ER prior to being transferred.

## 2012-03-23 NOTE — Progress Notes (Signed)
Patient ID: [name], female   DOB: Sep 01, 1965, 46 y.o.   MRN: [id number] 03-23-12@ nursing shift note: D: pt cbg were done today and she has been at 123 this am and 193 at 1200. A: approached md and ask should this pt be on a sliding scale insulin. R: he stated "no" but  He did increase her metformin. Pt is eating her lunch.  R: rn will monitor and q 15 min cks continue.

## 2012-03-23 NOTE — ED Provider Notes (Signed)
Patient accepted by: Behavioral health, excepting Dr. Dub Mikes. Patient is voluntary commitment.  Shelda Jakes, MD 03/23/12 [phone]

## 2012-03-23 NOTE — H&P (Signed)
  Pt was seen by me today and I agree with the key elements documented in H&P.  
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
## 2012-03-23 NOTE — BHH Counselor (Signed)
Patient has been accepted to [facility] by Dr. Theotis Barrio to the services of Dr. Dub Mikes room 302.2
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
## 2012-03-23 NOTE — Progress Notes (Signed)
Patient ID: [name], female   DOB: [birth year], 46 y.o.   MRN: [id number] Psychoeducational Group Note  Date:  03/23/2012 Time:  1315pm  Group Topic/Focus:  Making Healthy Choices:   The focus of this group is to help patients identify negative/unhealthy choices they were using prior to admission and identify positive/healthier coping strategies to replace them upon discharge.  Participation Level:  Did Not Attend  Participation Quality:    Affect:  Cognitive:   Insight:    Engagement in Group:  Additional Comments: no rn group on that hall.   Valente David 03/23/2012,2:31 PM

## 2012-03-23 NOTE — Progress Notes (Addendum)
Psychoeducational Group Note  Date:  03/23/2012 Time:  1000am  Group Topic/Focus:  Making Healthy Choices:   The focus of this group is to help patients identify negative/unhealthy choices they were using prior to admission and identify positive/healthier coping strategies to replace them upon discharge.  Participation Level:  Did Not Attend  Participation Quality:    Affect:    Additional Comments:  Self inventory group-done by Karle Starch 03/23/2012,10:09 AM

## 2012-03-23 NOTE — BH Assessment (Signed)
BHH Assessment Progress Note   This clinician notified by Ardelia Mems (RN at [facility]) that patient had been accepted by Dr. Theotis Barrio to Dr. Dub Mikes.  Bed assignment 302-2.  Clinician notified Baxter Hire (RN at [facility]) and Dr. Jodelle Gross (EDP).  Patient signed voluntary admission form and support paperwork was completed.

## 2012-03-23 NOTE — Clinical Social Work Note (Signed)
BHH Group Notes: (Clinical Social Work)   03/23/2012 10-11am   Type of Therapy:  Group Therapy   Participation Level:  Did Not Attend    Ambrose Mantle, LCSW 03/23/2012, 11:23 AM

## 2012-03-24 DIAGNOSIS — F39 Unspecified mood [affective] disorder: Secondary | ICD-10-CM | POA: Diagnosis present

## 2012-03-24 DIAGNOSIS — F319 Bipolar disorder, unspecified: Secondary | ICD-10-CM

## 2012-03-24 LAB — GLUCOSE, CAPILLARY
Glucose-Capillary: 101 mg/dL — ABNORMAL HIGH (ref 70–99)
Glucose-Capillary: 231 mg/dL — ABNORMAL HIGH (ref 70–99)
Glucose-Capillary: 146 mg/dL — ABNORMAL HIGH (ref 70–99)

## 2012-03-24 MED ORDER — ZIPRASIDONE HCL 80 MG PO CAPS
80.0000 mg | ORAL_CAPSULE | Freq: Two times a day (BID) | ORAL | Status: DC
  Administered 2012-03-24 – 2012-03-26 (×4): 80 mg via ORAL
  Filled 2012-03-24 (×8): qty 1

## 2012-03-24 MED ORDER — HYDROXYZINE HCL 50 MG PO TABS
50.0000 mg | ORAL_TABLET | Freq: Three times a day (TID) | ORAL | Status: DC | PRN
  Administered 2012-03-26: 50 mg via ORAL

## 2012-03-24 MED ORDER — DIVALPROEX SODIUM 250 MG PO DR TAB
250.0000 mg | DELAYED_RELEASE_TABLET | ORAL | Status: DC
  Administered 2012-03-25 – 2012-03-26 (×2): 250 mg via ORAL
  Filled 2012-03-24 (×4): qty 1

## 2012-03-24 MED ORDER — GABAPENTIN 300 MG PO CAPS: 300.0000 mg | ORAL_CAPSULE | Freq: Three times a day (TID) | ORAL | Status: DC | PRN

## 2012-03-24 NOTE — Tx Team (Signed)
Interdisciplinary Treatment Plan Update (Adult)  Date:  03/24/2012  Time Reviewed:  4:14 PM   Progress in Treatment: Attending groups: Yes Participating in groups:  Yes Taking medication as prescribed: Yes Tolerating medication:  Yes Family/Significant othe contact made:  To be assessed by CSW Patient understands diagnosis:  Yes Discussing patient identified problems/goals with staff:  Yes Medical problems stabilized or resolved:  Yes Denies suicidal/homicidal ideation: Yes Issues/concerns per patient self-inventory:  None identified Other: N/A  New problem(s) identified: None Identified  Reason for Continuation of Hospitalization: Anxiety Depression Medication stabilization Suicidal ideation  Interventions implemented related to continuation of hospitalization: mood stabilization, medication monitoring and adjustment, group therapy and psycho education, suicide risk assessment, collateral contact, aftercare planning, ongoing physician assessments and safety checks q 15 mins  Additional comments: N/A  Estimated length of stay: 3-5 days  Discharge Plan: patient to follow up with [facility] for outpatient therapy and psychiatry  New goal(s): N/A  Review of initial/current patient goals per problem list:    2.  Goal (s): Reduce depressive symptoms from a 10 to a 3  Met:  No  Target date: 3-5 days  As evidenced by: Pt rates at a 8  3.  Goal (s): Reduce anxiety symptoms from a 10 to a 3  Met:  No  Target date:  3-5 days  As evidenced by: Pt rates at a  8  4.  Goal(s):  Met:  No  Target date:   As evidenced by:   Attendees: Patient:     Family:     Physician: Geoffery Lyons, MD 03/24/2012 4:14 PM   Nursing: Alease Frame, RN 03/24/2012 4:14 PM   Clinical Social Worker:  Whitman Meinhardt,, LCSW 03/24/2012  4:14 PM   Other: Robbie Louis, RN 03/24/2012  4:14 PM   Other:  Nanine Means, NP   Other:     Other:     Other:      Scribe for Treatment Team:   Reyes Ivan  03/24/2012 4:14 PM
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
## 2012-03-24 NOTE — Progress Notes (Signed)
Psychoeducational Group Note  Date:  03/24/2012 Time:  1100am  Group Topic/Focus:  Self Care:   The focus of this group is to help patients understand the importance of self-care in order to improve or restore emotional, physical, spiritual, interpersonal, and financial health.  Participation Level:  Did Not Attend   Additional Comments:  Patient did not attend.  Ardelle Park O 03/24/2012, 3:09 PM

## 2012-03-24 NOTE — Progress Notes (Signed)
[facility] LCSW Aftercare Discharge Planning Group Note  03/24/2012 2:37 PM  Participation Quality:  Patient did not attend discharge planning group [name] 03/24/2012, 2:37 PM

## 2012-03-24 NOTE — Progress Notes (Signed)
[facility] MD Progress Note  03/24/2012 4:04 PM [name]  MRN:  [id number] Subjective:  Has not been doing well. She is having a hard time. She worries, she stays suspicious, afraid that by staying around her family, something might happen to them. She was on Depakote, she felt it help before. She was on Geodon and they suggested that they needed to increase it. She stays with her mother, her main source of support. Does report on going anxiety Diagnosis:  Bipolar Disorder with psychotic features  ADL's:  Intact  Sleep: Poor  Appetite:  Fair  Suicidal Ideation:  Plan:  Denies plans, has ideas on and off Intent:  Denies Means:  Denies Homicidal Ideation:  Plan:  Denies Intent:  Denies Means:  Denies AEB (as evidenced by):  Psychiatric Specialty Exam: Review of Systems  Constitutional: Negative.   HENT: Negative.   Eyes: Negative.   Respiratory: Negative.   Cardiovascular: Negative.   Gastrointestinal: Negative.   Genitourinary: Negative.   Musculoskeletal: Negative.   Skin: Negative.   Neurological: Negative.   Endo/Heme/Allergies: Negative.   Psychiatric/Behavioral: Positive for depression. The patient is nervous/anxious and has insomnia.        Paranoia    Blood pressure 130/80, pulse 80, temperature 98.7 F (37.1 C), temperature source Oral, resp. rate 18, height 5\' 4"  (1.626 m), weight 83.462 kg (184 lb), last menstrual period 02/17/2012.Body mass index is 31.58 kg/(m^2).  General Appearance: Fairly Groomed  Patent attorney::  Minimal  Speech:  Clear and Coherent and Slow, not spontaneous, guarded  Volume:  Decreased  Mood:  Depressed and suspicious  Affect:  Blunt  Thought Process:  Coherent, Goal Directed and answers questions  Orientation:  Full (Time, Place, and Person)  Thought Content:  symptoms, suspiciousness  Suicidal Thoughts:  Yes.  without intent/plan  Homicidal Thoughts:  No  Memory:  Immediate;   Fair Recent;   Fair Remote;   Fair  Judgement:  Fair   Insight:  Shallow  Psychomotor Activity:  Decreased  Concentration:  Fair  Recall:  Fair  Akathisia:  No  Handed:  Right  AIMS (if indicated):     Assets:  Desire for Improvement  Sleep:  Number of Hours: 6.5    Current Medications: Current Facility-Administered Medications  Medication Dose Route Frequency Provider Last Rate Last Dose  . acetaminophen (TYLENOL) tablet 650 mg  650 mg Oral Q6H PRN Wonda Cerise, MD      . alum & mag hydroxide-simeth (MAALOX/MYLANTA) 200-200-20 MG/5ML suspension 30 mL  30 mL Oral Q4H PRN Wonda Cerise, MD      . divalproex (DEPAKOTE ER) 24 hr tablet 500 mg  500 mg Oral QHS Jorje Guild, PA-C   500 mg at 03/23/12 2101  . ferrous gluconate (FERGON) tablet 324 mg  324 mg Oral BID WC Jorje Guild, PA-C   324 mg at 03/24/12 0806  . hydrOXYzine (ATARAX/VISTARIL) tablet 25 mg  25 mg Oral TID PRN Jorje Guild, PA-C   25 mg at 03/24/12 0806  . insulin glargine (LANTUS) injection 10 Units  10 Units Subcutaneous QHS Jorje Guild, PA-C      . magnesium hydroxide (MILK OF MAGNESIA) suspension 30 mL  30 mL Oral Daily PRN Wonda Cerise, MD      . metFORMIN (GLUCOPHAGE) tablet 1,000 mg  1,000 mg Oral BID WC Wonda Cerise, MD   1,000 mg at 03/24/12 0806  . metoprolol (LOPRESSOR) tablet 50 mg  50 mg Oral BID Jorje Guild, PA-C   50  mg at 03/24/12 0805  . mirtazapine (REMERON) tablet 15 mg  15 mg Oral QHS Jorje Guild, PA-C   15 mg at 03/23/12 2101  . pantoprazole (PROTONIX) EC tablet 40 mg  40 mg Oral BID Jorje Guild, PA-C   40 mg at 03/24/12 0806  . [DISCONTINUED] nicotine (NICODERM CQ - dosed in mg/24 hours) patch 21 mg  21 mg Transdermal Daily Jorje Guild, PA-C   21 mg at 03/23/12 1611    Lab Results:  Results for orders placed during the hospital encounter of 03/23/12 (from the past 48 hour(s))  GLUCOSE, CAPILLARY     Status: Abnormal   Collection Time   03/23/12  6:24 AM      Component Value Range Comment   Glucose-Capillary 123 (*) 70 - 99 mg/dL    Comment 1 Notify RN      Comment 2 Documented  in Chart     GLUCOSE, CAPILLARY     Status: Abnormal   Collection Time   03/23/12 11:38 AM      Component Value Range Comment   Glucose-Capillary 193 (*) 70 - 99 mg/dL   GLUCOSE, CAPILLARY     Status: Abnormal   Collection Time   03/23/12  5:28 PM      Component Value Range Comment   Glucose-Capillary 132 (*) 70 - 99 mg/dL   GLUCOSE, CAPILLARY     Status: Abnormal   Collection Time   03/23/12  8:57 PM      Component Value Range Comment   Glucose-Capillary 119 (*) 70 - 99 mg/dL    Comment 1 Notify RN      Comment 2 Documented in Chart     GLUCOSE, CAPILLARY     Status: Abnormal   Collection Time   03/24/12  6:40 AM      Component Value Range Comment   Glucose-Capillary 101 (*) 70 - 99 mg/dL   GLUCOSE, CAPILLARY     Status: Normal   Collection Time   03/24/12 11:47 AM      Component Value Range Comment   Glucose-Capillary 86  70 - 99 mg/dL     Physical Findings: AIMS: Facial and Oral Movements Muscles of Facial Expression: None, normal Lips and Perioral Area: None, normal Jaw: None, normal Tongue: None, normal,Extremity Movements Upper (arms, wrists, hands, fingers): None, normal Lower (legs, knees, ankles, toes): None, normal, Trunk Movements Neck, shoulders, hips: None, normal, Overall Severity Severity of abnormal movements (highest score from questions above): None, normal Incapacitation due to abnormal movements: None, normal Patient's awareness of abnormal movements (rate only patient's report): No Awareness, Dental Status Current problems with teeth and/or dentures?: No Does patient usually wear dentures?: No  CIWA:    COWS:     Treatment Plan Summary: Daily contact with patient to assess and evaluate symptoms and progress in treatment Medication management Reassess medications: Resume Geodon, increase to 80 mg BID                                         Increase the Depakote to 250 in AM, 500 mg HS                                        Plan:  Neurontin 300 mg TID PRN anxiety vs Vistaril 50 mg  Medical Decision Making Problem Points:  Established problem, worsening (2), Review of last therapy session (1) and Review of psycho-social stressors (1) Data Points:  Review of medication regiment & side effects (2) Review of new medications or change in dosage (2)  I certify that inpatient services furnished can reasonably be expected to improve the patient's condition.   Milley Vining A 03/24/2012, 4:04 PM

## 2012-03-24 NOTE — Progress Notes (Signed)
Psychoeducational Group Note  Date:  03/24/2012 Time:  2000   Group Topic/Focus:  AA---Addiction  Participation Level:  Did Not Attend  Participation Quality:    Affect:    Cognitive:    Insight:    Engagement in Group:    Additional Comments:    Humberto Seals Breckin 03/24/2012, 11:11 PM

## 2012-03-24 NOTE — Progress Notes (Signed)
[facility] LCSW Group Therapy  03/24/2012 2:29 PM  Type of Therapy:  Group Therapy  Participation Level:  Did Not Attend   Summary of Progress/Problems: Patient did not attend Aris [place] 03/24/2012, 2:29 PM

## 2012-03-24 NOTE — Progress Notes (Signed)
Pt has not been up for any groups today.  She reports feeling depressed and anxious.  She denies any substance abuse issues.  She has been somatically fixated on her labs especially her potassium.  Look back and she had labs on 03/21/12 where her potassium was 3.7.  Dr. Dub Mikes did agree to have labs drawn in the morning.  She did admit to to some passive S/I but has no plan and does contract for safety on the unit.  Her 1700 CBG was 231 and she stated,"I have been drinking a lot of orange juice to help with my potassium.  We again discussed her potassium on the 6 was 3.7.

## 2012-03-24 NOTE — Progress Notes (Signed)
Patient ID: [name], female   DOB: 1966/03/15, 46 y.o.   MRN: [id number]  D:  Pt was laying in bed drowsy, and appeared to be apprehensive about answering questions. When asked if she had any questions or concerns, pt commented on acid reflux. Pt informed the writer that she gets meds in the for reflux and had no other complaints. Pt refused to attend groups.   A:Support and encouragement was offered. 15 min checks continued for safety.  R: Pt remains safe.

## 2012-03-24 NOTE — Progress Notes (Signed)
Nutrition Brief Note  Patient identified on the Malnutrition Screening Tool (MST) Report  Body mass index is 31.58 kg/(m^2). Pt meets criteria for obese class 1 based on current BMI.   Current diet order is regular, patient is consuming approximately 50-75% of meals at this time. Labs and medications reviewed.   Pt denies nutrition-related concerns.  States "my nerves are what have me losing wt."  She feels her potassium is low.  Notified RN of pt's request for a potassium level check.  Educated pt on high potassium food choices for dinner to improve potassium. No nutrition interventions warranted at this time. If nutrition issues arise, please consult RD.   Loyce Dys, MS RD LDN Clinical Inpatient Dietitian Pager: [phone] Weekend/After hours pager: [phone]

## 2012-03-24 NOTE — Progress Notes (Signed)
Patient ID: [name], female   DOB: 1965/10/01, 46 y.o.   MRN: [id number]  PER STATE REGULATIONS 482.30  THIS CHART WAS REVIEWED FOR MEDICAL NECESSITY WITH RESPECT TO THE PATIENT'S ADMISSION/ DURATION OF STAY.  12/8-12/01/2012  NEXT REVIEW DATE: 03/26/2012  Willa Rough, RN, BSN CASE MANAGER

## 2012-03-25 DIAGNOSIS — F3130 Bipolar disorder, current episode depressed, mild or moderate severity, unspecified: Principal | ICD-10-CM

## 2012-03-25 LAB — COMPREHENSIVE METABOLIC PANEL
Alkaline Phosphatase: 62 U/L (ref 39–117)
BUN: 9 mg/dL (ref 6–23)
CO2: 26 mEq/L (ref 19–32)
GFR calc Af Amer: >90 mL/min (ref >90)
GFR calc non Af Amer: >90 mL/min (ref >90)
Glucose, Bld: 135 mg/dL — ABNORMAL HIGH (ref 70–99)
Potassium: 4 mEq/L (ref 3.5–5.1)
Total Bilirubin: 0.2 mg/dL — ABNORMAL LOW (ref 0.3–1.2)
Total Protein: 6.5 g/dL (ref 6.0–8.3)

## 2012-03-25 LAB — GLUCOSE, CAPILLARY
Glucose-Capillary: 137 mg/dL — ABNORMAL HIGH (ref 70–99)
Glucose-Capillary: 173 mg/dL — ABNORMAL HIGH (ref 70–99)

## 2012-03-25 LAB — CBC
HCT: 39.9 % (ref 36.0–46.0)
Hemoglobin: 13.6 g/dL (ref 12.0–15.0)
MCHC: 34.1 g/dL (ref 30.0–36.0)

## 2012-03-25 NOTE — Clinical Social Work Note (Signed)
[facility] LCSW Aftercare Discharge Planning Group Note  03/25/2012 8:45 AM  Participation Quality:  Did Not Attend   Nakiah Osgood Horton, LCSWA 03/25/2012 9:57 AM   

## 2012-03-25 NOTE — Progress Notes (Signed)
[facility] Group Notes:  (Counselor/Nursing/MHT/Case Management/Adjunct)  03/25/2012 1:27 PM  Type of Therapy:  Psychoeducational Skills  Participation Level:  Did Not Attend  Summary of Progress/Problems: [name] did not attend a psychoeducational group that focused on using quality time with support systems/indivudals to engage in healthy coping skills.     [name] 03/25/2012, 1:27 PM

## 2012-03-25 NOTE — Progress Notes (Signed)
Psychoeducational Group Note  Date:  03/25/2012 Time:  1100  Group Topic/Focus:  Recovery Goals:   The focus of this group is to identify appropriate goals for recovery and establish a plan to achieve them.  Participation Level:  Did Not Attend   Additional Comments:  Patient did not attend group.  Ardelle Park O 03/25/2012, 3:32 PM

## 2012-03-25 NOTE — Progress Notes (Signed)
Pt has been in bed most of the day she has been up for a few groups today.  She stated,"I am feeling much better than yesterday I don't know if it was the geodon or what but I do feel much better"  She reported her sleep as well appetite as improving energy normal and ability to pay attention as improving.  She rated both her depression and hopelessness a 4 and denied anxiety on her self-inventory.  She denied any S/H ideation or A/V hallucinations.  She had no prns or new orders thus far.  We did talk about her labs which she was pleased with  Her potassium at 4 today

## 2012-03-25 NOTE — Progress Notes (Signed)
Psychoeducational Group Note  Date:  03/25/2012 Time:  2000  Group Topic/Focus:  AA meeting   Participation Level:  Did Not Attend  Participation Quality:    Affect:    Cognitive:    Insight:    Engagement in Group:    Additional Comments:  Pt didn't attend AA meeting this evening.   Jaylanni Eltringham A 03/25/2012, 10:13 PM

## 2012-03-25 NOTE — Progress Notes (Signed)
Patient ID: [name], female   DOB: 1965-07-04, 46 y.o.   MRN: [id number] D: "I'm just tired." A: Pt. Looks sleepy and actually had to be called to the med window from her bed to receive her medications for HS.  Denies lethality and A/V/H's or withdrawal symptoms. R: Pt. Took her HS meds and returned to bed: will continue to monitor for changes.

## 2012-03-25 NOTE — Clinical Social Work Note (Signed)
[facility] LCSW Group Therapy  03/25/2012  1:15 PM   Type of Therapy:  Group Therapy  Participation Level:  Did Not Attend with speaker from mental health association  Darrelle Barrell Horton, LCSWA 03/25/2012 2:16 PM   

## 2012-03-25 NOTE — Progress Notes (Signed)
[facility] MD Progress Note  03/25/2012 1:00 PM [name]  MRN:  [id number] Subjective: Did sleep better last night. She spoke with her mother who plans to stay around for a little while. She is suggesting that she goes back to [place] with her She is not sure what to do. She has a hard time trusting. Not sure if she can get used to be there. Her mother is her only support. She is willing to give this medication regime a try. She has had no side effects so far. Diagnosis:   Axis I: Bipolar, Depressed Axis II: Deferred Axis III:  Past Medical History  Diagnosis Date  . Arteriosclerotic cardiovascular disease (ASCVD)     Minimal at cath in [facility].stress nuclear study in 8/08 with nl EF; neg stress echo in 2010  . Diabetes mellitus, type 2 2000    Onset in 2000; no insulin  . Hyperlipidemia   . Hypertension `    during treatment with Geodon  . Gastroesophageal reflux disease     Schatzki's ring  . Anemia, iron deficiency   . Alcohol abuse   . Depression   . Community acquired pneumonia 01/03/10    2011; with pleural effusion-[facility] acute resp failure  . Obesity   . Community acquired pneumonia 05/2010  . Schizoaffective disorder     requiring multiple psychiatric admissions  . Dysphagia   . DM (diabetes mellitus)    Axis IV: housing problems, problems related to social environment and problems with primary support group Axis V: 51-60 moderate symptoms  ADL's:  Intact  Sleep: Fair  Appetite:  Fair  Suicidal Ideation:  Plan:  denies Intent:  denies Means:  denies Homicidal Ideation:  Plan:  denies Intent:  denies Means:  denies AEB (as evidenced by):  Psychiatric Specialty Exam: Review of Systems  Constitutional: Negative.   HENT: Negative.   Eyes: Negative.   Respiratory: Negative.   Cardiovascular: Negative.   Gastrointestinal: Negative.   Genitourinary: Negative.   Musculoskeletal: Negative.   Skin: Negative.   Neurological: Negative.    Endo/Heme/Allergies: Negative.   Psychiatric/Behavioral: Negative.     Blood pressure 107/72, pulse 102, temperature 98.5 F (36.9 C), temperature source Oral, resp. rate 18, height 5\' 4"  (1.626 m), weight 83.462 kg (184 lb), last menstrual period 02/17/2012.Body mass index is 31.58 kg/(m^2).  General Appearance: Fairly Groomed  Patent attorney::  Fair  Speech:  Clear and Coherent, Slow and not spontaneous  Volume:  Decreased  Mood:  Depressed and worried  Affect:  Restricted  Thought Process:  Coherent, Goal Directed and answersa questions  Orientation:  Full (Time, Place, and Person)  Thought Content:  no spontaneous content  Suicidal Thoughts:  No  Homicidal Thoughts:  No  Memory:  Immediate;   Fair Recent;   Fair Remote;   Fair  Judgement:  Fair  Insight:  Shallow  Psychomotor Activity:  Decreased  Concentration:  Fair  Recall:  Fair  Akathisia:  No  Handed:  Right  AIMS (if indicated):     Assets:  Desire for Improvement  Sleep:  Number of Hours: 6.75    Current Medications: Current Facility-Administered Medications  Medication Dose Route Frequency Provider Last Rate Last Dose  . acetaminophen (TYLENOL) tablet 650 mg  650 mg Oral Q6H PRN Wonda Cerise, MD      . alum & mag hydroxide-simeth (MAALOX/MYLANTA) 200-200-20 MG/5ML suspension 30 mL  30 mL Oral Q4H PRN Wonda Cerise, MD      .  divalproex (DEPAKOTE ER) 24 hr tablet 500 mg  500 mg Oral QHS Jorje Guild, PA-C   500 mg at 03/24/12 2135  . divalproex (DEPAKOTE) DR tablet 250 mg  250 mg Oral BH-q7a Rachael Fee, MD   250 mg at 03/25/12 1610  . ferrous gluconate (FERGON) tablet 324 mg  324 mg Oral BID WC Jorje Guild, PA-C   324 mg at 03/25/12 0805  . gabapentin (NEURONTIN) capsule 300 mg  300 mg Oral TID PRN Rachael Fee, MD      . hydrOXYzine (ATARAX/VISTARIL) tablet 50 mg  50 mg Oral TID PRN Rachael Fee, MD      . insulin glargine (LANTUS) injection 10 Units  10 Units Subcutaneous QHS Jorje Guild, PA-C   10 Units at 03/24/12 2135   . magnesium hydroxide (MILK OF MAGNESIA) suspension 30 mL  30 mL Oral Daily PRN Wonda Cerise, MD      . metFORMIN (GLUCOPHAGE) tablet 1,000 mg  1,000 mg Oral BID WC Wonda Cerise, MD   1,000 mg at 03/25/12 0805  . metoprolol (LOPRESSOR) tablet 50 mg  50 mg Oral BID Jorje Guild, PA-C   50 mg at 03/25/12 0805  . mirtazapine (REMERON) tablet 15 mg  15 mg Oral QHS Jorje Guild, PA-C   15 mg at 03/24/12 2135  . pantoprazole (PROTONIX) EC tablet 40 mg  40 mg Oral BID Jorje Guild, PA-C   40 mg at 03/25/12 0805  . ziprasidone (GEODON) capsule 80 mg  80 mg Oral BID WC Rachael Fee, MD   80 mg at 03/25/12 0805  . [DISCONTINUED] hydrOXYzine (ATARAX/VISTARIL) tablet 25 mg  25 mg Oral TID PRN Jorje Guild, PA-C   25 mg at 03/24/12 9604    Lab Results:  Results for orders placed during the hospital encounter of 03/23/12 (from the past 48 hour(s))  GLUCOSE, CAPILLARY     Status: Abnormal   Collection Time   03/23/12  5:28 PM      Component Value Range Comment   Glucose-Capillary 132 (*) 70 - 99 mg/dL   GLUCOSE, CAPILLARY     Status: Abnormal   Collection Time   03/23/12  8:57 PM      Component Value Range Comment   Glucose-Capillary 119 (*) 70 - 99 mg/dL    Comment 1 Notify RN      Comment 2 Documented in Chart     GLUCOSE, CAPILLARY     Status: Abnormal   Collection Time   03/24/12  6:40 AM      Component Value Range Comment   Glucose-Capillary 101 (*) 70 - 99 mg/dL   GLUCOSE, CAPILLARY     Status: Normal   Collection Time   03/24/12 11:47 AM      Component Value Range Comment   Glucose-Capillary 86  70 - 99 mg/dL   GLUCOSE, CAPILLARY     Status: Abnormal   Collection Time   03/24/12  5:13 PM      Component Value Range Comment   Glucose-Capillary 231 (*) 70 - 99 mg/dL   GLUCOSE, CAPILLARY     Status: Abnormal   Collection Time   03/24/12  9:13 PM      Component Value Range Comment   Glucose-Capillary 146 (*) 70 - 99 mg/dL    Comment 1 Notify RN     GLUCOSE, CAPILLARY     Status: Abnormal   Collection  Time   03/24/12 11:26 PM      Component Value  Range Comment   Glucose-Capillary 122 (*) 70 - 99 mg/dL    Comment 1 Notify RN     GLUCOSE, CAPILLARY     Status: Abnormal   Collection Time   03/25/12  6:17 AM      Component Value Range Comment   Glucose-Capillary 125 (*) 70 - 99 mg/dL    Comment 1 Notify RN     CBC     Status: Normal   Collection Time   03/25/12  6:30 AM      Component Value Range Comment   WBC 5.6  4.0 - 10.5 K/uL    RBC 4.78  3.87 - 5.11 MIL/uL    Hemoglobin 13.6  12.0 - 15.0 g/dL    HCT 24.4  01.0 - 27.2 %    MCV 83.5  78.0 - 100.0 fL    MCH 28.5  26.0 - 34.0 pg    MCHC 34.1  30.0 - 36.0 g/dL    RDW 53.6  64.4 - 03.4 %    Platelets 208  150 - 400 K/uL   COMPREHENSIVE METABOLIC PANEL     Status: Abnormal   Collection Time   03/25/12  6:30 AM      Component Value Range Comment   Sodium 138  135 - 145 mEq/L    Potassium 4.0  3.5 - 5.1 mEq/L    Chloride 102  96 - 112 mEq/L    CO2 26  19 - 32 mEq/L    Glucose, Bld 135 (*) 70 - 99 mg/dL    BUN 9  6 - 23 mg/dL    Creatinine, Ser 7.42  0.50 - 1.10 mg/dL    Calcium 9.9  8.4 - 59.5 mg/dL    Total Protein 6.5  6.0 - 8.3 g/dL    Albumin 3.5  3.5 - 5.2 g/dL    AST 9  0 - 37 U/L    ALT 8  0 - 35 U/L    Alkaline Phosphatase 62  39 - 117 U/L    Total Bilirubin 0.2 (*) 0.3 - 1.2 mg/dL    GFR calc non Af Amer >90  >90 mL/min    GFR calc Af Amer >90  >90 mL/min     Physical Findings: AIMS: Facial and Oral Movements Muscles of Facial Expression: None, normal Lips and Perioral Area: None, normal Jaw: None, normal Tongue: None, normal,Extremity Movements Upper (arms, wrists, hands, fingers): None, normal Lower (legs, knees, ankles, toes): None, normal, Trunk Movements Neck, shoulders, hips: None, normal, Overall Severity Severity of abnormal movements (highest score from questions above): None, normal Incapacitation due to abnormal movements: None, normal Patient's awareness of abnormal movements (rate only  patient's report): No Awareness, Dental Status Current problems with teeth and/or dentures?: No Does patient usually wear dentures?: No  CIWA:    COWS:     Treatment Plan Summary: Daily contact with patient to assess and evaluate symptoms and progress in treatment Medication management  Plan: Supportive approach/coping skills/improve reality testing           Continue Geodon/Depakote/Neurontin           Labs: CBC, CMET WNL Medical Decision Making Problem Points:  Established problem, stable/improving (1), Review of last therapy session (1) and Review of psycho-social stressors (1) Data Points:  Review of medication regiment & side effects (2)  I certify that inpatient services furnished can reasonably be expected to improve the patient's condition.   Lynda Wanninger A 03/25/2012, 1:00 PM

## 2012-03-26 ENCOUNTER — Other Ambulatory Visit: Payer: Self-pay | Admitting: Internal Medicine

## 2012-03-26 DIAGNOSIS — F39 Unspecified mood [affective] disorder: Secondary | ICD-10-CM

## 2012-03-26 DIAGNOSIS — F411 Generalized anxiety disorder: Secondary | ICD-10-CM

## 2012-03-26 LAB — GLUCOSE, CAPILLARY: Glucose-Capillary: 130 mg/dL — ABNORMAL HIGH (ref 70–99)

## 2012-03-26 MED ORDER — HYDROXYZINE HCL 50 MG PO TABS: 50.0000 mg | ORAL_TABLET | Freq: Three times a day (TID) | ORAL | Status: DC | PRN

## 2012-03-26 MED ORDER — MOMETASONE FUROATE 50 MCG/ACT NA SUSP: NASAL | Status: DC

## 2012-03-26 MED ORDER — GABAPENTIN 300 MG PO CAPS: 300.0000 mg | ORAL_CAPSULE | Freq: Three times a day (TID) | ORAL | Status: DC | PRN

## 2012-03-26 MED ORDER — HYDROCHLOROTHIAZIDE 25 MG PO TABS: 25.0000 mg | ORAL_TABLET | Freq: Every day | ORAL | Status: DC

## 2012-03-26 MED ORDER — DIVALPROEX SODIUM ER 500 MG PO TB24: 500.0000 mg | ORAL_TABLET | Freq: Every day | ORAL | Status: DC

## 2012-03-26 MED ORDER — LOVASTATIN 20 MG PO TABS: 20.0000 mg | ORAL_TABLET | Freq: Every day | ORAL | Status: DC

## 2012-03-26 MED ORDER — INSULIN GLARGINE 100 UNIT/ML ~~LOC~~ SOLN: 10.0000 [IU] | Freq: Every day | SUBCUTANEOUS | Status: DC

## 2012-03-26 MED ORDER — FERROUS GLUCONATE 240 (27 FE) MG PO TABS: 240.0000 mg | ORAL_TABLET | Freq: Two times a day (BID) | ORAL | Status: DC

## 2012-03-26 MED ORDER — ZIPRASIDONE HCL 80 MG PO CAPS: 80.0000 mg | ORAL_CAPSULE | Freq: Two times a day (BID) | ORAL | Status: DC

## 2012-03-26 MED ORDER — CETIRIZINE HCL 10 MG PO TABS: 10.0000 mg | ORAL_TABLET | Freq: Every day | ORAL | Status: DC

## 2012-03-26 MED ORDER — MIRTAZAPINE 15 MG PO TABS: 15.0000 mg | ORAL_TABLET | Freq: Every day | ORAL | Status: DC

## 2012-03-26 MED ORDER — PANTOPRAZOLE SODIUM 40 MG PO TBEC: 40.0000 mg | DELAYED_RELEASE_TABLET | Freq: Two times a day (BID) | ORAL | Status: DC

## 2012-03-26 MED ORDER — METFORMIN HCL 1000 MG PO TABS: 1000.0000 mg | ORAL_TABLET | Freq: Two times a day (BID) | ORAL | Status: DC

## 2012-03-26 MED ORDER — DIVALPROEX SODIUM 250 MG PO DR TAB: 250.0000 mg | DELAYED_RELEASE_TABLET | ORAL | Status: DC

## 2012-03-26 MED ORDER — METOPROLOL TARTRATE 50 MG PO TABS: 50.0000 mg | ORAL_TABLET | Freq: Two times a day (BID) | ORAL | Status: DC

## 2012-03-26 NOTE — Progress Notes (Signed)
[facility] Group Notes:  (Counselor/Nursing/MHT/Case Management/Adjunct)  03/26/2012 4:43 PM  Type of Therapy:  Psychoeducational Skills  Participation Level:  Minimal  Participation Quality:  Drowsy  Affect:  Lethargic  Cognitive:  Appropriate and Oriented  Insight:  Good  Engagement in Group:  Limited  Engagement in Therapy:  n/a  Modes of Intervention:  Activity, Discussion, Education, Socialization and Support  Summary of Progress/Problems: Zuriel attended psychoeducational group on labels. Noelene viewed a video depicting different ways labels are used to change perceptions. Cherrie was falling asleep at times but shared when prompted while group discussed what labels are, how we use them, how they effect they way we think about and perceive the world, and listed positive  and negative labels they have used or been called. Bryer was given a homework assignment to list 10 words she has been labeled and find the reality of the situation/label.   Wandra Scot 03/26/2012, 4:43 PM

## 2012-03-26 NOTE — Progress Notes (Signed)
[facility] LCSW Aftercare Discharge Planning Group Note  03/26/2012 8:17 AM  Participation Quality:  Attentive  Affect:  Flat  Cognitive:  Alert  Insight:  Developing/Improving  Engagement in Group:  Engaged  Modes of Intervention:  Discussion, Education and Problem-solving  Summary of Progress/Problems: Patient participated in group. Rated her depression and anxiety at a 1.  Patient denies suicide ideation. Patient discussed feeling that her medications are stable and she is ready for discharge home. Patient agrees to follow up with [facility] for outpatient follow up. [name] 03/26/2012, 8:17 AM

## 2012-03-26 NOTE — Progress Notes (Signed)
[facility] INPATIENT:  Family/Significant Other Suicide Prevention Education  Suicide Prevention Education:  Contact Attempts: Delfino Lovett,  has been identified by the patient as the family member/significant other with whom the patient will be residing, and identified as the person(s) who will aid the patient in the event of a mental health crisis.  With written consent from the patient, two attempts were made to provide suicide prevention education, prior to and/or following the patient's discharge.  We were unsuccessful in providing suicide prevention education.  A suicide education pamphlet was given to the patient to share with family/significant other.  Date and time of first attempt:03/26/12  11:00am Date and time of second attempt:03/26/12  1:15pm  Aris Georgia 03/26/2012, 1:17 PM

## 2012-03-26 NOTE — Progress Notes (Signed)
[facility] Adult Case Management Discharge Plan :  Will you be returning to the same living situation after discharge: Yes,    At discharge, do you have transportation home?:Yes,    Do you have the ability to pay for your medications:Yes,     Release of information consent forms completed and in the chart;  Patient's signature needed at discharge.  Patient to Follow up at: Follow-up Information    Follow up with [facility]. On 03/28/2012. (Walk in appt scheduled for Friday, 03/28/12  between 7:45am and 11:00am  referral [id number])    Contact information:   [address] [place], [place] [address] [phone] Fax [phone]         Patient denies SI/HI:   Yes,       Safety Planning and Suicide Prevention discussed:  Yes,     [name] 03/26/2012, 2:50 PM

## 2012-03-26 NOTE — Discharge Summary (Signed)
Physician Discharge Summary Note  Patient:  [name] is an 46 y.o., female MRN:  [id number] DOB:  [birth year] Patient phone:  [phone] (home)  Patient address:   [address] [place] [address],   Date of Admission:  03/23/2012 Date of Discharge: 03/26/2012  Reason for Admission:  Depression with suicide attempt by overdose  Discharge Diagnoses: Active Problems:  Mood disorder  Review of Systems  Constitutional: Negative.   HENT: Negative.   Eyes: Negative.   Respiratory: Negative.   Cardiovascular: Negative.   Gastrointestinal: Negative.   Genitourinary: Negative.   Musculoskeletal: Negative.   Skin: Negative.   Neurological: Negative.   Endo/Heme/Allergies: Negative.   Psychiatric/Behavioral: Positive for depression. The patient is nervous/anxious.    Axis Diagnosis:   AXIS I:  Bipolar, Depressed and Generalized Anxiety Disorder AXIS II:  Deferred AXIS III:   Past Medical History  Diagnosis Date  . Arteriosclerotic cardiovascular disease (ASCVD)     Minimal at cath in [facility].stress nuclear study in 8/08 with nl EF; neg stress echo in 2010  . Diabetes mellitus, type 2 2000    Onset in 2000; no insulin  . Hyperlipidemia   . Hypertension `    during treatment with Geodon  . Gastroesophageal reflux disease     Schatzki's ring  . Anemia, iron deficiency   . Alcohol abuse   . Depression   . Community acquired pneumonia 01/03/10    2011; with pleural effusion-[facility] acute resp failure  . Obesity   . Community acquired pneumonia 05/2010  . Schizoaffective disorder     requiring multiple psychiatric admissions  . Dysphagia   . DM (diabetes mellitus)    AXIS IV:  economic problems, other psychosocial or environmental problems, problems related to social environment and problems with primary support group AXIS V:  61-70 mild symptoms  Level of Care:  OP  Hospital Course:  See comments at the end.  Consults:  None  Significant  Diagnostic Studies:  labs: completed and reviewed, stable  Discharge Vitals:   Blood pressure 130/84, pulse 99, temperature 98.6 F (37 C), temperature source Oral, resp. rate 16, height 5\' 4"  (1.626 m), weight 83.462 kg (184 lb), last menstrual period 02/17/2012. Body mass index is 31.58 kg/(m^2). Lab Results:   Results for orders placed during the hospital encounter of 03/23/12 (from the past 72 hour(s))  GLUCOSE, CAPILLARY     Status: Abnormal   Collection Time   03/23/12 11:38 AM      Component Value Range Comment   Glucose-Capillary 193 (*) 70 - 99 mg/dL   GLUCOSE, CAPILLARY     Status: Abnormal   Collection Time   03/23/12  5:28 PM      Component Value Range Comment   Glucose-Capillary 132 (*) 70 - 99 mg/dL   GLUCOSE, CAPILLARY     Status: Abnormal   Collection Time   03/23/12  8:57 PM      Component Value Range Comment   Glucose-Capillary 119 (*) 70 - 99 mg/dL    Comment 1 Notify RN      Comment 2 Documented in Chart     GLUCOSE, CAPILLARY     Status: Abnormal   Collection Time   03/24/12  6:40 AM      Component Value Range Comment   Glucose-Capillary 101 (*) 70 - 99 mg/dL   GLUCOSE, CAPILLARY     Status: Normal   Collection Time   03/24/12 11:47 AM  Component Value Range Comment   Glucose-Capillary 86  70 - 99 mg/dL   GLUCOSE, CAPILLARY     Status: Abnormal   Collection Time   03/24/12  5:13 PM      Component Value Range Comment   Glucose-Capillary 231 (*) 70 - 99 mg/dL   GLUCOSE, CAPILLARY     Status: Abnormal   Collection Time   03/24/12  9:13 PM      Component Value Range Comment   Glucose-Capillary 146 (*) 70 - 99 mg/dL    Comment 1 Notify RN     GLUCOSE, CAPILLARY     Status: Abnormal   Collection Time   03/24/12 11:26 PM      Component Value Range Comment   Glucose-Capillary 122 (*) 70 - 99 mg/dL    Comment 1 Notify RN     GLUCOSE, CAPILLARY     Status: Abnormal   Collection Time   03/25/12  6:17 AM      Component Value Range Comment    Glucose-Capillary 125 (*) 70 - 99 mg/dL    Comment 1 Notify RN     CBC     Status: Normal   Collection Time   03/25/12  6:30 AM      Component Value Range Comment   WBC 5.6  4.0 - 10.5 K/uL    RBC 4.78  3.87 - 5.11 MIL/uL    Hemoglobin 13.6  12.0 - 15.0 g/dL    HCT 40.9  81.1 - 91.4 %    MCV 83.5  78.0 - 100.0 fL    MCH 28.5  26.0 - 34.0 pg    MCHC 34.1  30.0 - 36.0 g/dL    RDW 78.2  95.6 - 21.3 %    Platelets 208  150 - 400 K/uL   COMPREHENSIVE METABOLIC PANEL     Status: Abnormal   Collection Time   03/25/12  6:30 AM      Component Value Range Comment   Sodium 138  135 - 145 mEq/L    Potassium 4.0  3.5 - 5.1 mEq/L    Chloride 102  96 - 112 mEq/L    CO2 26  19 - 32 mEq/L    Glucose, Bld 135 (*) 70 - 99 mg/dL    BUN 9  6 - 23 mg/dL    Creatinine, Ser 0.86  0.50 - 1.10 mg/dL    Calcium 9.9  8.4 - 57.8 mg/dL    Total Protein 6.5  6.0 - 8.3 g/dL    Albumin 3.5  3.5 - 5.2 g/dL    AST 9  0 - 37 U/L    ALT 8  0 - 35 U/L    Alkaline Phosphatase 62  39 - 117 U/L    Total Bilirubin 0.2 (*) 0.3 - 1.2 mg/dL    GFR calc non Af Amer >90  >90 mL/min    GFR calc Af Amer >90  >90 mL/min   GLUCOSE, CAPILLARY     Status: Abnormal   Collection Time   03/25/12 11:48 AM      Component Value Range Comment   Glucose-Capillary 174 (*) 70 - 99 mg/dL   GLUCOSE, CAPILLARY     Status: Abnormal   Collection Time   03/25/12  5:03 PM      Component Value Range Comment   Glucose-Capillary 137 (*) 70 - 99 mg/dL   GLUCOSE, CAPILLARY     Status: Abnormal   Collection Time   03/25/12  9:20  PM      Component Value Range Comment   Glucose-Capillary 173 (*) 70 - 99 mg/dL   GLUCOSE, CAPILLARY     Status: Abnormal   Collection Time   03/26/12  5:52 AM      Component Value Range Comment   Glucose-Capillary 130 (*) 70 - 99 mg/dL    Comment 1 Notify RN       Physical Findings: AIMS: Facial and Oral Movements Muscles of Facial Expression: None, normal Lips and Perioral Area: None, normal Jaw: None,  normal Tongue: None, normal,Extremity Movements Upper (arms, wrists, hands, fingers): None, normal Lower (legs, knees, ankles, toes): None, normal, Trunk Movements Neck, shoulders, hips: None, normal, Overall Severity Severity of abnormal movements (highest score from questions above): None, normal Incapacitation due to abnormal movements: None, normal Patient's awareness of abnormal movements (rate only patient's report): No Awareness, Dental Status Current problems with teeth and/or dentures?: No Does patient usually wear dentures?: No  CIWA:    COWS:     Psychiatric Specialty Exam: See Psychiatric Specialty Exam and Suicide Risk Assessment completed by Attending Physician prior to discharge.  Discharge destination:  Home  Is patient on multiple antipsychotic therapies at discharge:  No   Has Patient had three or more failed trials of antipsychotic monotherapy by history:  No Recommended Plan for Multiple Antipsychotic Therapies:  N/A  Discharge Orders    Future Orders Please Complete By Expires   Diet - low sodium heart healthy      Activity as tolerated - No restrictions          Medication List     As of 03/26/2012 10:50 AM    STOP taking these medications         benzonatate 100 MG capsule   Commonly known as: TESSALON      bismuth subsalicylate 262 MG/15ML suspension   Commonly known as: PEPTO BISMOL      diphenhydrAMINE 25 MG tablet   Commonly known as: BENADRYL      LORazepam 0.5 MG tablet   Commonly known as: ATIVAN      POTASSIUM BICARBONATE PO      traZODone 100 MG tablet   Commonly known as: DESYREL      TAKE these medications      Indication    cetirizine 10 MG tablet   Commonly known as: ZYRTEC   Take 10 mg by mouth at bedtime.       divalproex 500 MG 24 hr tablet   Commonly known as: DEPAKOTE ER   Take 1 tablet (500 mg total) by mouth at bedtime.    Indication: Depressive Phase of Manic-Depression      divalproex 250 MG DR tablet    Commonly known as: DEPAKOTE   Take 1 tablet (250 mg total) by mouth every morning.    Indication: Depressive Phase of Manic-Depression      ferrous gluconate 240 (27 FE) MG tablet   Commonly known as: FERGON   Take 1 tablet (240 mg total) by mouth 2 (two) times daily.    Indication: Iron deficiency      gabapentin 300 MG capsule   Commonly known as: NEURONTIN   Take 1 capsule (300 mg total) by mouth 3 (three) times daily as needed (anxiety).    Indication: anxiety      hydrochlorothiazide 25 MG tablet   Commonly known as: HYDRODIURIL   Take 1 tablet (25 mg total) by mouth daily.    Indication: High Blood Pressure  hydrOXYzine 50 MG tablet   Commonly known as: ATARAX/VISTARIL   Take 1 tablet (50 mg total) by mouth 3 (three) times daily as needed for anxiety.    Indication: Anxiety Neurosis      ibuprofen 200 MG tablet   Commonly known as: ADVIL,MOTRIN   Take 400 mg by mouth every 8 (eight) hours as needed. Aches and pains, fever       insulin glargine 100 UNIT/ML injection   Commonly known as: LANTUS   Inject 10 Units into the skin at bedtime.    Indication: Type 2 Diabetes      lovastatin 20 MG tablet   Commonly known as: MEVACOR   Take 1 tablet (20 mg total) by mouth at bedtime.    Indication: Disease involving Cholesterol Deposits in the Arteries      metFORMIN 1000 MG tablet   Commonly known as: GLUCOPHAGE   Take 1 tablet (1,000 mg total) by mouth 2 (two) times daily with a meal.    Indication: Type 2 Diabetes      metoprolol 50 MG tablet   Commonly known as: LOPRESSOR   Take 1 tablet (50 mg total) by mouth 2 (two) times daily.    Indication: High Blood Pressure      mirtazapine 15 MG tablet   Commonly known as: REMERON   Take 1 tablet (15 mg total) by mouth at bedtime.    Indication: Trouble Sleeping, Major Depressive Disorder      mometasone 50 MCG/ACT nasal spray   Commonly known as: NASONEX   2 sprays in each nostril daily    Indication: Hayfever       pantoprazole 40 MG tablet   Commonly known as: PROTONIX   Take 1 tablet (40 mg total) by mouth 2 (two) times daily.    Indication: Gastroesophageal Reflux Disease      ziprasidone 80 MG capsule   Commonly known as: GEODON   Take 1 capsule (80 mg total) by mouth 2 (two) times daily with a meal.    Indication: Manic-Depression, schizoaffective           Follow-up Information    Follow up with [facility].   Contact information:   [address] [place], [place] [address] [phone] Fax [phone]         Follow-up recommendations:  Activity as tolerated, low-sodium/carb modified diet  Comments:  Patient's depression a 2-3/10, denies suicidal or homicidal ideations, she came to [facility] with severe depression and an intentional overdose.  During her stay, her Geodon was increased from 60 mg BID to 80 mg BID, vistaril 25 mg TID and neuroton 300 mg TID PRN anxiety--not at the same time with patient's anxiety decreasing greatly.  [facility] was concerned about her low potassium even though her potassium level was WNL at 3.7--additional labs ordered and her K+ at 4.0, dietician also consulted for the patient and educated her about eating hight potassium foods and to address her weight loss--patient stated her "nerves" were the reason for her weight loss, appetite good at this time, sleep good, denies auditory/visual hallucinations, denies any physical pain, received individual and group therapy for her depression/anxiety/and grieving issues.  Altha is stable for discharge at this time.  She requested her medicines be called to her pharmacy, request granted.  Total Discharge Time:  Greater than 30 minutes  Signed: Nanine Means, PMH-NP 03/26/2012, 10:50 AM

## 2012-03-26 NOTE — Progress Notes (Signed)
Patient ID: [name], female   DOB: 08-28-1965, 46 y.o.   MRN: [id number] She has been discharged home and was picked up by her mother. Pt. Voiced understanding of discharge instruction and of follow up. She denies thoughts of SI. All belonging taken  Home with her.

## 2012-03-26 NOTE — Progress Notes (Signed)
[facility] INPATIENT:  Family/Significant Other Suicide Prevention Education  Suicide Prevention Education: This CSW was able to contact patient's mother by phone after 2 failed previous attempts Education Completed; Delfino Lovett, [phone]  has been identified by the patient as the family member/significant other with whom the patient will be residing, and identified as the person(s) who will aid the patient in the event of a mental health crisis (suicidal ideations/suicide attempt).  With written consent from the patient, the family member/significant other has been provided the following suicide prevention education, prior to the and/or following the discharge of the patient.  The suicide prevention education provided includes the following:  Suicide risk factors  Suicide prevention and interventions  National Suicide Hotline telephone number  [facility] assessment telephone number  [facility] Emergency Assistance 911  [facility] and/or Residential Mobile Crisis Unit telephone number  Request made of family/significant other to:  Remove weapons (e.g., guns, rifles, knives), all items previously/currently identified as safety concern.    Remove drugs/medications (over-the-counter, prescriptions, illicit drugs), all items previously/currently identified as a safety concern.  The family member/significant other verbalizes understanding of the suicide prevention education information provided.  The family member/significant other agrees to remove the items of safety concern listed above.  Verna Czech Stanford 03/26/2012, 3:01 PM

## 2012-03-26 NOTE — [facility] Suicide Risk Assessment (Signed)
Suicide Risk Assessment  Discharge Assessment     Demographic Factors:  Living alone and Unemployed  Mental Status Per Nursing Assessment::   On Admission:   (denies feeling suicidal now that she is here.)  Current Mental Status by Physician: In full contact with reality. There are no suicidal ideas, plans or intent. Her mood is euthymic, her affect is appropraite. She is going to go to [place] with her mother for a little while. She feels ready to go home. She endorses that she feels "so much better."   Loss Factors: NA  Historical Factors: NA  Risk Reduction Factors:   Sense of responsibility to family and Positive social support  Continued Clinical Symptoms:  Bipolar Disorder:   Depressive phase  Cognitive Features That Contribute To Risk: Thought constriction   Suicide Risk:  Minimal: No identifiable suicidal ideation.  Patients presenting with no risk factors but with morbid ruminations; may be classified as minimal risk based on the severity of the depressive symptoms  Discharge Diagnoses:   AXIS I:  Bipolar Disorder, Mixed, depressive episode, psychotic features AXIS II:  Deferred AXIS III:   Past Medical History  Diagnosis Date  . Arteriosclerotic cardiovascular disease (ASCVD)     Minimal at cath in [facility].stress nuclear study in 8/08 with nl EF; neg stress echo in 2010  . Diabetes mellitus, type 2 2000    Onset in 2000; no insulin  . Hyperlipidemia   . Hypertension `    during treatment with Geodon  . Gastroesophageal reflux disease     Schatzki's ring  . Anemia, iron deficiency   . Alcohol abuse   . Depression   . Community acquired pneumonia 01/03/10    2011; with pleural effusion-[facility] acute resp failure  . Obesity   . Community acquired pneumonia 05/2010  . Schizoaffective disorder     requiring multiple psychiatric admissions  . Dysphagia   . DM (diabetes mellitus)    AXIS IV:  None identified at time of discharge AXIS V:   61-70 mild symptoms  Plan Of Care/Follow-up recommendations:  Activity:  As tolerated Diet:  Regular Will follow up with [facility] after she returns from [place]  Is patient on multiple antipsychotic therapies at discharge:  No   Has Patient had three or more failed trials of antipsychotic monotherapy by history:  No  Recommended Plan for Multiple Antipsychotic Therapies: N/A   Jamya Starry A 03/26/2012, 12:14 PM

## 2012-03-26 NOTE — Progress Notes (Signed)
Patient ID: [name], female   DOB: Nov 18, 1965, 46 y.o.   MRN: [id number]   PER STATE REGULATIONS 482.30  THIS CHART WAS REVIEWED FOR MEDICAL NECESSITY WITH RESPECT TO THE PATIENT'S ADMISSION/ DURATION OF STAY.  NEXT REVIEW DATE: 03/29/2012 Willa Rough, RN, BSN CASE MANAGER

## 2012-03-28 NOTE — Progress Notes (Signed)
Patient Discharge Instructions:  After Visit Summary (AVS):   Faxed to:  03/28/12 Psychiatric Admission Assessment Note:   Faxed to:  03/28/12 Suicide Risk Assessment - Discharge Assessment:   Faxed to:  03/28/12 Faxed/Sent to the Next Level Care provider:  03/28/12 Faxed to [facility] @ [phone]  Jerelene Redden, 03/28/2012, 3:51 PM

## 2012-03-31 ENCOUNTER — Emergency Department (HOSPITAL_COMMUNITY)
Admission: EM | Admit: 2012-03-31 | Discharge: 2012-03-31 | Disposition: A | Discharge status: Home / Self Care | Payer: Medicare Other | Attending: Emergency Medicine | Admitting: Emergency Medicine

## 2012-03-31 ENCOUNTER — Encounter (HOSPITAL_COMMUNITY): Payer: Self-pay | Admitting: *Deleted

## 2012-03-31 DIAGNOSIS — F17200 Nicotine dependence, unspecified, uncomplicated: Secondary | ICD-10-CM | POA: Insufficient documentation

## 2012-03-31 DIAGNOSIS — E785 Hyperlipidemia, unspecified: Secondary | ICD-10-CM | POA: Insufficient documentation

## 2012-03-31 DIAGNOSIS — R892 Abnormal level of other drugs, medicaments and biological substances in specimens from other organs, systems and tissues: Secondary | ICD-10-CM | POA: Insufficient documentation

## 2012-03-31 DIAGNOSIS — R404 Transient alteration of awareness: Secondary | ICD-10-CM | POA: Insufficient documentation

## 2012-03-31 DIAGNOSIS — Z79899 Other long term (current) drug therapy: Secondary | ICD-10-CM | POA: Insufficient documentation

## 2012-03-31 DIAGNOSIS — Z8719 Personal history of other diseases of the digestive system: Secondary | ICD-10-CM | POA: Insufficient documentation

## 2012-03-31 DIAGNOSIS — F205 Residual schizophrenia: Secondary | ICD-10-CM | POA: Insufficient documentation

## 2012-03-31 DIAGNOSIS — Z794 Long term (current) use of insulin: Secondary | ICD-10-CM | POA: Insufficient documentation

## 2012-03-31 DIAGNOSIS — I2510 Atherosclerotic heart disease of native coronary artery without angina pectoris: Secondary | ICD-10-CM | POA: Insufficient documentation

## 2012-03-31 DIAGNOSIS — D509 Iron deficiency anemia, unspecified: Secondary | ICD-10-CM | POA: Insufficient documentation

## 2012-03-31 DIAGNOSIS — K219 Gastro-esophageal reflux disease without esophagitis: Secondary | ICD-10-CM | POA: Insufficient documentation

## 2012-03-31 DIAGNOSIS — R4182 Altered mental status, unspecified: Secondary | ICD-10-CM | POA: Diagnosis not present

## 2012-03-31 DIAGNOSIS — F329 Major depressive disorder, single episode, unspecified: Secondary | ICD-10-CM | POA: Insufficient documentation

## 2012-03-31 DIAGNOSIS — E669 Obesity, unspecified: Secondary | ICD-10-CM | POA: Insufficient documentation

## 2012-03-31 DIAGNOSIS — R5381 Other malaise: Secondary | ICD-10-CM | POA: Diagnosis not present

## 2012-03-31 DIAGNOSIS — E119 Type 2 diabetes mellitus without complications: Secondary | ICD-10-CM | POA: Insufficient documentation

## 2012-03-31 DIAGNOSIS — I10 Essential (primary) hypertension: Secondary | ICD-10-CM | POA: Insufficient documentation

## 2012-03-31 DIAGNOSIS — F1010 Alcohol abuse, uncomplicated: Secondary | ICD-10-CM | POA: Insufficient documentation

## 2012-03-31 DIAGNOSIS — F3289 Other specified depressive episodes: Secondary | ICD-10-CM | POA: Insufficient documentation

## 2012-03-31 DIAGNOSIS — Z8701 Personal history of pneumonia (recurrent): Secondary | ICD-10-CM | POA: Insufficient documentation

## 2012-03-31 DIAGNOSIS — F319 Bipolar disorder, unspecified: Secondary | ICD-10-CM | POA: Diagnosis not present

## 2012-03-31 DIAGNOSIS — R400 Somnolence: Secondary | ICD-10-CM

## 2012-03-31 LAB — CBC WITH DIFFERENTIAL/PLATELET
Eosinophils Absolute: 0.3 10*3/uL (ref 0.0–0.7)
Eosinophils Relative: 2 % (ref 0–5)
Hemoglobin: 13.3 g/dL (ref 12.0–15.0)
Lymphs Abs: 2.6 10*3/uL (ref 0.7–4.0)
MCH: 29 pg (ref 26.0–34.0)
MCV: 86.2 fL (ref 78.0–100.0)
Monocytes Relative: 6 % (ref 3–12)
Platelets: 208 10*3/uL (ref 150–400)
RBC: 4.58 MIL/uL (ref 3.87–5.11)

## 2012-03-31 LAB — BASIC METABOLIC PANEL
BUN: 9 mg/dL (ref 6–23)
Calcium: 9.1 mg/dL (ref 8.4–10.5)
GFR calc non Af Amer: 79 mL/min — ABNORMAL LOW (ref >90)
Glucose, Bld: 171 mg/dL — ABNORMAL HIGH (ref 70–99)

## 2012-03-31 LAB — GLUCOSE, CAPILLARY: Glucose-Capillary: 186 mg/dL — ABNORMAL HIGH (ref 70–99)

## 2012-03-31 NOTE — ED Notes (Signed)
CBG en route was 239

## 2012-03-31 NOTE — ED Notes (Signed)
Pt awake and alert at this time asking for phone to let family know where she is. Phone provided. Depakote level added on to previous labs. Pt a/o x 4, able to carry full conversation.

## 2012-03-31 NOTE — ED Provider Notes (Signed)
History    This chart was scribed for Dione Booze, MD, MD by Smitty Pluck, ED Scribe. The patient was seen in room APA07/APA07 and the patient's care was started at 3:02PM.   CSN: [id number]  Arrival date & time 03/31/12  1238     Chief Complaint  Patient presents with  . Altered Mental Status    (Consider location/radiation/quality/duration/timing/severity/associated sxs/prior treatment) The history is provided by the patient. No language interpreter was used.   [name] is a 46 y.o. female who presents to the Emergency Department due to [facility] calling EMS for drowsiness and possible Depakote overdose today. Pt reports that mental health doctor stated that she might have taken too much Depakote due her drowsiness and altered mental status during appointment.Pt report she was "out of it" when she say her mental health doctor. She reports having nausea and fatigue. Pt reports that she normally takes 750 mg of Depakote daily (500mg  at night and 250 mg during day). Pt reports that currently she does not have any symptoms or pain.   Past Medical History  Diagnosis Date  . Arteriosclerotic cardiovascular disease (ASCVD)     Minimal at cath in [facility].stress nuclear study in 8/08 with nl EF; neg stress echo in 2010  . Diabetes mellitus, type 2 2000    Onset in 2000; no insulin  . Hyperlipidemia   . Hypertension `    during treatment with Geodon  . Gastroesophageal reflux disease     Schatzki's ring  . Anemia, iron deficiency   . Alcohol abuse   . Depression   . Community acquired pneumonia 01/03/10    2011; with pleural effusion[facility] acute resp failure  . Obesity   . Community acquired pneumonia 05/2010  . Schizoaffective disorder     requiring multiple psychiatric admissions  . Dysphagia   . DM (diabetes mellitus)     Past Surgical History  Procedure Date  . Dilation and curettage, diagnostic / therapeutic 1992  . Esophagogastroduodenoscopy  09/16/08    Dr. Ronni Rumble hiatal hernia/excoriations involving the cardia and mucosa consistent with trauma, antral erosions  of linear petechiae ? gastritis versus early gastric antral vascular  ectasia.Marland Kitchen biopsy showed reactive gastropathy. No H. pylori.  . Esophagogastroduodenoscopy 09/2007    Dr. Rinaldo Ratel ring, dilated to 56 French Maloney dilator, small hiatal hernia, antral erosions, biopsies reactive gastropathy.  Gaspar Bidding dilation 07/17/2011    [facility] sedation-->distal esophageal stricture s/p dilation, chronic gastritis, multiple ulcers in stomach. no h.pylori  . Colonoscopy 01/2006    internal hemorrhoids  . Colonoscopy 01/10/2012    Procedure: COLONOSCOPY;  Surgeon: Corbin Ade, MD;  Location: [facility];  Service: Endoscopy;  Laterality: N/A;  entered cecum @ [phone] ; total cecal withdrawal time = 8 minutes    Family History  Problem Relation Age of Onset  . Colon cancer Other   . Hypertension Mother   . Stroke Father     deceased at age 77  . Heart disease Sister   . Anesthesia problems Neg Hx   . Hypotension Neg Hx   . Malignant hyperthermia Neg Hx   . Pseudochol deficiency Neg Hx     History  Substance Use Topics  . Smoking status: Current Some Day Smoker -- 1.5 packs/day for 30 years    Types: Cigarettes  . Smokeless tobacco: Not on file     Comment: Sometimes she smokes more; up to 2 packs/day., currently trying to quit (September 2013).  . Alcohol Use:  No     Comment: occasional; intake has decreased since she found out she has ulcer, no significant alcohol since April 2013.    OB History    Grav Para Term Preterm Abortions TAB SAB Ect Mult Living                  Review of Systems  All other systems reviewed and are negative.    Allergies  Metronidazole; Orange; Shrimp; Penicillins; Sulfonamide derivatives; Glipizide; and Sulfamethoxazole w-trimethoprim  Home Medications   Current Outpatient Rx  Name  Route  Sig  Dispense  Refill  .  CETIRIZINE HCL 10 MG PO TABS   Oral   Take 1 tablet (10 mg total) by mouth at bedtime.   30 tablet   0   . DIVALPROEX SODIUM ER 500 MG PO TB24   Oral   Take 1 tablet (500 mg total) by mouth at bedtime.   30 tablet   0   . DIVALPROEX SODIUM 250 MG PO TBEC   Oral   Take 1 tablet (250 mg total) by mouth every morning.   30 tablet   0   . FERROUS GLUCONATE 240 (27 FE) MG PO TABS   Oral   Take 1 tablet (240 mg total) by mouth 2 (two) times daily.   60 tablet   0   . GABAPENTIN 300 MG PO CAPS   Oral   Take 1 capsule (300 mg total) by mouth 3 (three) times daily as needed (anxiety).   90 capsule   0   . HYDROCHLOROTHIAZIDE 25 MG PO TABS   Oral   Take 1 tablet (25 mg total) by mouth daily.   30 tablet   0   . HYDROXYZINE HCL 50 MG PO TABS   Oral   Take 1 tablet (50 mg total) by mouth 3 (three) times daily as needed for anxiety.   30 tablet   0   . IBUPROFEN 200 MG PO TABS   Oral   Take 400 mg by mouth every 8 (eight) hours as needed. Aches and pains, fever         . INSULIN GLARGINE 100 UNIT/ML [place] SOLN   Subcutaneous   Inject 10 Units into the skin at bedtime.   10 mL   0   . LOVASTATIN 20 MG PO TABS   Oral   Take 1 tablet (20 mg total) by mouth at bedtime.   90 tablet   1   . METFORMIN HCL 1000 MG PO TABS   Oral   Take 1 tablet (1,000 mg total) by mouth 2 (two) times daily with a meal.   62 tablet   0   . METOPROLOL TARTRATE 50 MG PO TABS   Oral   Take 1 tablet (50 mg total) by mouth 2 (two) times daily.   62 tablet   11   . MIRTAZAPINE 15 MG PO TABS   Oral   Take 1 tablet (15 mg total) by mouth at bedtime.   30 tablet   0   . MOMETASONE FUROATE 50 MCG/ACT NA SUSP      2 sprays in each nostril daily   17 g   0   . PANTOPRAZOLE SODIUM 40 MG PO TBEC   Oral   Take 1 tablet (40 mg total) by mouth 2 (two) times daily.   30 tablet   0   . ZIPRASIDONE HCL 80 MG PO CAPS   Oral   Take 1  capsule (80 mg total) by mouth 2 (two) times daily with  a meal.   60 capsule   0     BP 107/67  Pulse 81  Temp 97.5 F (36.4 C) (Oral)  Resp 16  Ht 5\' 4"  (1.626 m)  Wt 186 lb (84.369 kg)  BMI 31.93 kg/m2  SpO2 100%  LMP 02/17/2012  Physical Exam  Nursing note and vitals reviewed. Constitutional: She is oriented to person, place, and time. She appears well-developed and well-nourished. No distress.  HENT:  Head: Normocephalic and atraumatic.  Eyes: EOM are normal. Pupils are equal, round, and reactive to light.  Neck: Normal range of motion. Neck supple. No tracheal deviation present.  Cardiovascular: Normal rate, regular rhythm and normal heart sounds.   Pulmonary/Chest: Effort normal and breath sounds normal. No respiratory distress.  Abdominal: Soft. She exhibits no distension.  Musculoskeletal: Normal range of motion.  Neurological: She is alert and oriented to person, place, and time.  Skin: Skin is warm and dry.  Psychiatric: She has a normal mood and affect. Her behavior is normal.    ED Course  Procedures (including critical care time) DIAGNOSTIC STUDIES: Oxygen Saturation is 100% on room air, normal by my interpretation.    COORDINATION OF CARE: 3:06 PM Discussed ED treatment with pt     Results for orders placed during the hospital encounter of 03/31/12  GLUCOSE, CAPILLARY      Component Value Range   Glucose-Capillary 186 (*) 70 - 99 mg/dL   Comment 1 Documented in Chart     Comment 2 Notify RN    CBC WITH DIFFERENTIAL      Component Value Range   WBC 11.9 (*) 4.0 - 10.5 K/uL   RBC 4.58  3.87 - 5.11 MIL/uL   Hemoglobin 13.3  12.0 - 15.0 g/dL   HCT 16.1  09.6 - 04.5 %   MCV 86.2  78.0 - 100.0 fL   MCH 29.0  26.0 - 34.0 pg   MCHC 33.7  30.0 - 36.0 g/dL   RDW 40.9  81.1 - 91.4 %   Platelets 208  150 - 400 K/uL   Neutrophils Relative 71  43 - 77 %   Neutro Abs 8.4 (*) 1.7 - 7.7 K/uL   Lymphocytes Relative 22  12 - 46 %   Lymphs Abs 2.6  0.7 - 4.0 K/uL   Monocytes Relative 6  3 - 12 %   Monocytes  Absolute 0.7  0.1 - 1.0 K/uL   Eosinophils Relative 2  0 - 5 %   Eosinophils Absolute 0.3  0.0 - 0.7 K/uL   Basophils Relative 0  0 - 1 %   Basophils Absolute 0.0  0.0 - 0.1 K/uL  BASIC METABOLIC PANEL      Component Value Range   Sodium 134 (*) 135 - 145 mEq/L   Potassium 3.7  3.5 - 5.1 mEq/L   Chloride 98  96 - 112 mEq/L   CO2 27  19 - 32 mEq/L   Glucose, Bld 171 (*) 70 - 99 mg/dL   BUN 9  6 - 23 mg/dL   Creatinine, Ser 7.82  0.50 - 1.10 mg/dL   Calcium 9.1  8.4 - 95.6 mg/dL   GFR calc non Af Amer 79 (*) >90 mL/min   GFR calc Af Amer >90  >90 mL/min  VALPROIC ACID LEVEL      Component Value Range   Valproic Acid Lvl 45.2 (*) 50.0 - 100.0 ug/mL  1. Somnolence       MDM  Somnolence which has resolved. This may be due 2 side effects of valproic acid, or possibly interactions between her various medications. Old records are reviewed, and she has history of psychiatric evaluations and treatment for depression and schizoaffective disorder. Valproic acid level is actually subtherapeutic. She had been in formed that she could try taking all 750 mg at bedtime. I've advised her to try this since a widow while the shorter acting version to wear off before she needed to be alert during the daytime. She is to followup with her PCP. She continues to have problems with somnolence, or other medications we'll need to be adjusted      I personally performed the services described in this documentation, which was scribed in my presence. The recorded information has been reviewed and is accurate.      Dione Booze, MD 03/31/12 1537

## 2012-03-31 NOTE — ED Notes (Signed)
Pt picked up at [facility] due to altered loc. Pt was recently started on vistaril and geodon was increased. Pt also took regular dose of remeron last night and also had morning dose of depakote this morning. [facility] called EMS due to drowsiness, possibly over overmedicated.

## 2012-04-01 NOTE — Discharge Summary (Signed)
Agree with assessment and plan [name], M.D. 

## 2012-04-02 ENCOUNTER — Telehealth: Payer: Self-pay | Admitting: *Deleted

## 2012-04-02 NOTE — Telephone Encounter (Signed)
Pt wants refill on meds for diarrhea. Dr Everardo Beals denied med 03/27/12. Left message on home phone ID recording - med was denied 03/27/12 and if problems with diarrhea call [facility] for appt. Stanton Kidney Kamorie Aldous RN 04/02/12 4:45PM

## 2012-04-11 ENCOUNTER — Telehealth: Payer: Self-pay | Admitting: *Deleted

## 2012-04-11 NOTE — Telephone Encounter (Signed)
Fax from [employer] [facility] - requesting refill on Nicotine 21mg /24hr patch  Apply to skin daily as directed. Last filled 01/05/12  Qty 14 Thanks

## 2012-04-19 ENCOUNTER — Emergency Department (HOSPITAL_COMMUNITY)
Admission: EM | Admit: 2012-04-19 | Discharge: 2012-04-19 | Disposition: A | Discharge status: Home / Self Care | Payer: Medicare Other | Source: Home / Self Care | Attending: Emergency Medicine | Admitting: Emergency Medicine

## 2012-04-19 ENCOUNTER — Encounter (HOSPITAL_COMMUNITY): Payer: Self-pay | Admitting: *Deleted

## 2012-04-19 ENCOUNTER — Emergency Department (HOSPITAL_COMMUNITY): Payer: Medicare Other

## 2012-04-19 DIAGNOSIS — J811 Chronic pulmonary edema: Secondary | ICD-10-CM | POA: Diagnosis not present

## 2012-04-19 DIAGNOSIS — E871 Hypo-osmolality and hyponatremia: Secondary | ICD-10-CM | POA: Diagnosis not present

## 2012-04-19 DIAGNOSIS — J9620 Acute and chronic respiratory failure, unspecified whether with hypoxia or hypercapnia: Secondary | ICD-10-CM | POA: Diagnosis not present

## 2012-04-19 DIAGNOSIS — I498 Other specified cardiac arrhythmias: Secondary | ICD-10-CM | POA: Diagnosis not present

## 2012-04-19 DIAGNOSIS — Z794 Long term (current) use of insulin: Secondary | ICD-10-CM | POA: Diagnosis not present

## 2012-04-19 DIAGNOSIS — J984 Other disorders of lung: Secondary | ICD-10-CM | POA: Diagnosis not present

## 2012-04-19 DIAGNOSIS — E119 Type 2 diabetes mellitus without complications: Secondary | ICD-10-CM | POA: Diagnosis not present

## 2012-04-19 DIAGNOSIS — J189 Pneumonia, unspecified organism: Secondary | ICD-10-CM

## 2012-04-19 DIAGNOSIS — E785 Hyperlipidemia, unspecified: Secondary | ICD-10-CM | POA: Insufficient documentation

## 2012-04-19 DIAGNOSIS — F259 Schizoaffective disorder, unspecified: Secondary | ICD-10-CM | POA: Diagnosis present

## 2012-04-19 DIAGNOSIS — R5381 Other malaise: Secondary | ICD-10-CM | POA: Diagnosis not present

## 2012-04-19 DIAGNOSIS — I5032 Chronic diastolic (congestive) heart failure: Secondary | ICD-10-CM | POA: Diagnosis not present

## 2012-04-19 DIAGNOSIS — F319 Bipolar disorder, unspecified: Secondary | ICD-10-CM | POA: Diagnosis not present

## 2012-04-19 DIAGNOSIS — F329 Major depressive disorder, single episode, unspecified: Secondary | ICD-10-CM | POA: Insufficient documentation

## 2012-04-19 DIAGNOSIS — I5030 Unspecified diastolic (congestive) heart failure: Secondary | ICD-10-CM | POA: Diagnosis not present

## 2012-04-19 DIAGNOSIS — F17200 Nicotine dependence, unspecified, uncomplicated: Secondary | ICD-10-CM | POA: Insufficient documentation

## 2012-04-19 DIAGNOSIS — F1010 Alcohol abuse, uncomplicated: Secondary | ICD-10-CM | POA: Insufficient documentation

## 2012-04-19 DIAGNOSIS — E876 Hypokalemia: Secondary | ICD-10-CM | POA: Diagnosis not present

## 2012-04-19 DIAGNOSIS — M79609 Pain in unspecified limb: Secondary | ICD-10-CM | POA: Diagnosis not present

## 2012-04-19 DIAGNOSIS — D649 Anemia, unspecified: Secondary | ICD-10-CM | POA: Diagnosis present

## 2012-04-19 DIAGNOSIS — M7989 Other specified soft tissue disorders: Secondary | ICD-10-CM | POA: Diagnosis not present

## 2012-04-19 DIAGNOSIS — R9431 Abnormal electrocardiogram [ECG] [EKG]: Secondary | ICD-10-CM | POA: Diagnosis present

## 2012-04-19 DIAGNOSIS — R509 Fever, unspecified: Secondary | ICD-10-CM | POA: Insufficient documentation

## 2012-04-19 DIAGNOSIS — R0602 Shortness of breath: Secondary | ICD-10-CM | POA: Diagnosis not present

## 2012-04-19 DIAGNOSIS — R0609 Other forms of dyspnea: Secondary | ICD-10-CM | POA: Diagnosis not present

## 2012-04-19 DIAGNOSIS — I2789 Other specified pulmonary heart diseases: Secondary | ICD-10-CM | POA: Diagnosis present

## 2012-04-19 DIAGNOSIS — Z79899 Other long term (current) drug therapy: Secondary | ICD-10-CM | POA: Insufficient documentation

## 2012-04-19 DIAGNOSIS — I10 Essential (primary) hypertension: Secondary | ICD-10-CM | POA: Diagnosis present

## 2012-04-19 DIAGNOSIS — R1310 Dysphagia, unspecified: Secondary | ICD-10-CM | POA: Diagnosis not present

## 2012-04-19 DIAGNOSIS — A419 Sepsis, unspecified organism: Secondary | ICD-10-CM | POA: Diagnosis not present

## 2012-04-19 DIAGNOSIS — Z8719 Personal history of other diseases of the digestive system: Secondary | ICD-10-CM | POA: Insufficient documentation

## 2012-04-19 DIAGNOSIS — Z452 Encounter for adjustment and management of vascular access device: Secondary | ICD-10-CM | POA: Diagnosis not present

## 2012-04-19 DIAGNOSIS — E669 Obesity, unspecified: Secondary | ICD-10-CM | POA: Insufficient documentation

## 2012-04-19 DIAGNOSIS — R197 Diarrhea, unspecified: Secondary | ICD-10-CM | POA: Diagnosis not present

## 2012-04-19 DIAGNOSIS — D509 Iron deficiency anemia, unspecified: Secondary | ICD-10-CM | POA: Insufficient documentation

## 2012-04-19 DIAGNOSIS — J159 Unspecified bacterial pneumonia: Secondary | ICD-10-CM | POA: Insufficient documentation

## 2012-04-19 DIAGNOSIS — J9600 Acute respiratory failure, unspecified whether with hypoxia or hypercapnia: Secondary | ICD-10-CM | POA: Diagnosis not present

## 2012-04-19 DIAGNOSIS — F3289 Other specified depressive episodes: Secondary | ICD-10-CM | POA: Insufficient documentation

## 2012-04-19 DIAGNOSIS — R1110 Vomiting, unspecified: Secondary | ICD-10-CM | POA: Insufficient documentation

## 2012-04-19 DIAGNOSIS — I2510 Atherosclerotic heart disease of native coronary artery without angina pectoris: Secondary | ICD-10-CM | POA: Insufficient documentation

## 2012-04-19 DIAGNOSIS — J9589 Other postprocedural complications and disorders of respiratory system, not elsewhere classified: Secondary | ICD-10-CM | POA: Diagnosis not present

## 2012-04-19 DIAGNOSIS — Z4682 Encounter for fitting and adjustment of non-vascular catheter: Secondary | ICD-10-CM | POA: Diagnosis not present

## 2012-04-19 DIAGNOSIS — J1289 Other viral pneumonia: Secondary | ICD-10-CM | POA: Diagnosis not present

## 2012-04-19 DIAGNOSIS — K219 Gastro-esophageal reflux disease without esophagitis: Secondary | ICD-10-CM | POA: Diagnosis present

## 2012-04-19 MED ORDER — HYDROCODONE-HOMATROPINE 5-1.5 MG/5ML PO SYRP: 5.0000 mL | ORAL_SOLUTION | Freq: Four times a day (QID) | ORAL | Status: DC | PRN

## 2012-04-19 MED ORDER — LEVOFLOXACIN 500 MG PO TABS: 500.0000 mg | ORAL_TABLET | Freq: Every day | ORAL | Status: DC

## 2012-04-19 MED ORDER — ALBUTEROL SULFATE HFA 108 (90 BASE) MCG/ACT IN AERS: 2.0000 | INHALATION_SPRAY | RESPIRATORY_TRACT | Status: DC | PRN

## 2012-04-19 NOTE — ED Notes (Signed)
Pt states non productive cough, vomited x 1 last night, and states "I give out of breath when I walk and my hear feels like it beats funny sometimes." per pt. Dx with bronchitis a couple of weeks ago.

## 2012-04-19 NOTE — ED Provider Notes (Signed)
History  This chart was scribed for Gilda Crease, MD  by Bennett Scrape, ED Scribe. This patient was seen in room [facility]11/[facility]11 and the patient's care was started at 12:57 PM.  CSN: [id number]  Arrival date & time 04/19/12  1132   First MD Initiated Contact with Patient 04/19/12 1257      Chief Complaint  Patient presents with  . Cough  . Shortness of Breath    The history is provided by the patient. No language interpreter was used.    [name] is a 47 y.o. female who presents to the Emergency Department complaining of 2 days of a gradual onset, gradually worsening, constant, non-productive cough with associated subjective fevers and one episode of non-bloody emesis last night. Temperature was measured 98.3 in the ED. She also states that she feels SOB with exertion but attributes this to the cough. She reports that she has been taking advil with improvement in her symptoms. She denies having a h/o chronic pulmonary medical conditions. She denies abdominal pain, CP and diarrhea as associated symptoms. She has a h/o DM, HLD, HTN and depression and is a current everyday smoker but denies alcohol use.  Past Medical History  Diagnosis Date  . Arteriosclerotic cardiovascular disease (ASCVD)     Minimal at cath in [facility].stress nuclear study in 8/08 with nl EF; neg stress echo in 2010  . Diabetes mellitus, type 2 2000    Onset in 2000; no insulin  . Hyperlipidemia   . Hypertension `    during treatment with Geodon  . Gastroesophageal reflux disease     Schatzki's ring  . Anemia, iron deficiency   . Alcohol abuse   . Depression   . Community acquired pneumonia 01/03/10    2011; with pleural effusion-[facility] acute resp failure  . Obesity   . Community acquired pneumonia 05/2010  . Schizoaffective disorder     requiring multiple psychiatric admissions  . Dysphagia   . DM (diabetes mellitus)     Past Surgical History  Procedure Date  . Dilation  and curettage, diagnostic / therapeutic 1992  . Esophagogastroduodenoscopy 09/16/08    Dr. Ronni Rumble hiatal hernia/excoriations involving the cardia and mucosa consistent with trauma, antral erosions  of linear petechiae ? gastritis versus early gastric antral vascular  ectasia.Marland Kitchen biopsy showed reactive gastropathy. No H. pylori.  . Esophagogastroduodenoscopy 09/2007    Dr. Rinaldo Ratel ring, dilated to 56 French Maloney dilator, small hiatal hernia, antral erosions, biopsies reactive gastropathy.  Gaspar Bidding dilation 07/17/2011    [facility] sedation-->distal esophageal stricture s/p dilation, chronic gastritis, multiple ulcers in stomach. no h.pylori  . Colonoscopy 01/2006    internal hemorrhoids  . Colonoscopy 01/10/2012    Procedure: COLONOSCOPY;  Surgeon: Corbin Ade, MD;  Location: [facility];  Service: Endoscopy;  Laterality: N/A;  entered cecum @ [phone] ; total cecal withdrawal time = 8 minutes    Family History  Problem Relation Age of Onset  . Colon cancer Other   . Hypertension Mother   . Stroke Father     deceased at age 25  . Heart disease Sister   . Anesthesia problems Neg Hx   . Hypotension Neg Hx   . Malignant hyperthermia Neg Hx   . Pseudochol deficiency Neg Hx     History  Substance Use Topics  . Smoking status: Current Some Day Smoker -- 1.5 packs/day for 30 years    Types: Cigarettes  . Smokeless tobacco: Not on file  Comment: Sometimes she smokes more; up to 2 packs/day., currently trying to quit (September 2013).  . Alcohol Use: No     Comment: occasional; intake has decreased since she found out she has ulcer, no significant alcohol since April 2013.    No OB history provided.  Review of Systems  Constitutional: Positive for fever. Negative for chills.  Respiratory: Positive for cough. Negative for shortness of breath.   Cardiovascular: Negative for chest pain.  Gastrointestinal: Positive for vomiting. Negative for nausea, abdominal pain and diarrhea.    All other systems reviewed and are negative.    Allergies  Metronidazole; Orange; Shrimp; Penicillins; Sulfonamide derivatives; Glipizide; and Sulfamethoxazole w-trimethoprim  Home Medications   Current Outpatient Rx  Name  Route  Sig  Dispense  Refill  . CETIRIZINE HCL 10 MG PO TABS   Oral   Take 1 tablet (10 mg total) by mouth at bedtime.   30 tablet   0   . DIVALPROEX SODIUM ER 500 MG PO TB24   Oral   Take 1 tablet (500 mg total) by mouth at bedtime.   30 tablet   0   . DIVALPROEX SODIUM 250 MG PO TBEC   Oral   Take 1 tablet (250 mg total) by mouth every morning.   30 tablet   0   . FERROUS GLUCONATE 240 (27 FE) MG PO TABS   Oral   Take 1 tablet (240 mg total) by mouth 2 (two) times daily.   60 tablet   0   . GABAPENTIN 300 MG PO CAPS   Oral   Take 1 capsule (300 mg total) by mouth 3 (three) times daily as needed (anxiety).   90 capsule   0   . HYDROCHLOROTHIAZIDE 25 MG PO TABS   Oral   Take 1 tablet (25 mg total) by mouth daily.   30 tablet   0   . HYDROXYZINE HCL 50 MG PO TABS   Oral   Take 1 tablet (50 mg total) by mouth 3 (three) times daily as needed for anxiety.   30 tablet   0   . IBUPROFEN 200 MG PO TABS   Oral   Take 400 mg by mouth every 8 (eight) hours as needed. Aches and pains, fever         . INSULIN GLARGINE 100 UNIT/ML [place] SOLN   Subcutaneous   Inject 10 Units into the skin at bedtime.   10 mL   0   . LOVASTATIN 20 MG PO TABS   Oral   Take 1 tablet (20 mg total) by mouth at bedtime.   90 tablet   1   . METFORMIN HCL 1000 MG PO TABS   Oral   Take 1 tablet (1,000 mg total) by mouth 2 (two) times daily with a meal.   62 tablet   0   . METOPROLOL TARTRATE 50 MG PO TABS   Oral   Take 1 tablet (50 mg total) by mouth 2 (two) times daily.   62 tablet   11   . MIRTAZAPINE 15 MG PO TABS   Oral   Take 1 tablet (15 mg total) by mouth at bedtime.   30 tablet   0   . MOMETASONE FUROATE 50 MCG/ACT NA SUSP      2 sprays in  each nostril daily   17 g   0   . PANTOPRAZOLE SODIUM 40 MG PO TBEC   Oral   Take 1 tablet (40  mg total) by mouth 2 (two) times daily.   30 tablet   0   . TRAZODONE HCL 100 MG PO TABS               . ZIPRASIDONE HCL 80 MG PO CAPS   Oral   Take 1 capsule (80 mg total) by mouth 2 (two) times daily with a meal.   60 capsule   0     Triage Vitals: BP 106/64  Pulse 93  Temp 98.3 F (36.8 C) (Oral)  Resp 16  Ht 5\' 4"  (1.626 m)  Wt 186 lb (84.369 kg)  BMI 31.93 kg/m2  SpO2 97%  LMP 04/12/2012  Physical Exam  Nursing note and vitals reviewed. Constitutional: She is oriented to person, place, and time. She appears well-developed and well-nourished. No distress.  HENT:  Head: Normocephalic and atraumatic.  Eyes: Conjunctivae normal and EOM are normal.  Neck: Neck supple. No tracheal deviation present.  Cardiovascular: Normal rate and regular rhythm.   Pulmonary/Chest: Effort normal and breath sounds normal. No respiratory distress.  Abdominal: Soft. There is no tenderness. There is no rebound and no guarding.  Musculoskeletal: Normal range of motion. She exhibits no edema.  Neurological: She is alert and oriented to person, place, and time.  Skin: Skin is warm and dry.  Psychiatric: She has a normal mood and affect. Her behavior is normal.    ED Course  Procedures (including critical care time)  DIAGNOSTIC STUDIES: Oxygen Saturation is 97% on room air, adequate by my interpretation.    COORDINATION OF CARE: 1:03 PM- Informed pt that CXR showed PNA and advised pt that admission is not necessary due to her oxygen levels and vitals being stable. Discussed treatment plan which includes antibiotics with pt at bedside and pt agreed to plan.   Labs Reviewed - No data to display Dg Chest 2 View  04/19/2012  *RADIOLOGY REPORT*  Clinical Data: Cough and shortness of breath.  CHEST - 2 VIEW  Comparison: 03/09/2012  Findings: New right lower lobe airspace disease is seen,  consistent with pneumonia.  Left lung remains clear.  No evidence of pleural effusion.  Heart size mediastinal contours are within normal limits.  IMPRESSION: Right lower lobe airspace disease, consistent with pneumonia.  Post- treatment  radiographic followup recommended to confirm resolution.   Original Report Authenticated By: Myles Rosenthal, M.D.      1. Community acquired pneumonia       MDM  Patient presents to ER for evaluation of fever and cough for 2 days. Patient has normal oxygenation and does not appear septic. She is breathing comfortably. The chest x-ray was performed and does show right lower lobe pneumonia. Patient is a diabetic, but as she has a fairly normal examination she is a candidate for outpatient treatment. She'll be initiated on Levaquin and is to follow up with her primary doctor Monday morning for a recheck. Patient was counseled to return to the ER if her symptoms worsened.  I personally performed the services described in this documentation, which was scribed in my presence. The recorded information has been reviewed and is accurate.        Gilda Crease, MD 04/19/12 1324

## 2012-04-20 ENCOUNTER — Encounter (HOSPITAL_COMMUNITY): Payer: Self-pay | Admitting: Anesthesiology

## 2012-04-20 ENCOUNTER — Inpatient Hospital Stay (HOSPITAL_COMMUNITY)
Admission: EM | Admit: 2012-04-20 | Discharge: 2012-05-02 | DRG: 870 | Disposition: A | Discharge status: Home Health | Payer: Medicare Other | Attending: Internal Medicine | Admitting: Internal Medicine

## 2012-04-20 ENCOUNTER — Inpatient Hospital Stay (HOSPITAL_COMMUNITY): Payer: Medicare Other

## 2012-04-20 ENCOUNTER — Encounter (HOSPITAL_COMMUNITY): Payer: Self-pay | Admitting: Emergency Medicine

## 2012-04-20 DIAGNOSIS — R1310 Dysphagia, unspecified: Secondary | ICD-10-CM | POA: Diagnosis present

## 2012-04-20 DIAGNOSIS — I5032 Chronic diastolic (congestive) heart failure: Secondary | ICD-10-CM | POA: Diagnosis present

## 2012-04-20 DIAGNOSIS — I2789 Other specified pulmonary heart diseases: Secondary | ICD-10-CM | POA: Diagnosis present

## 2012-04-20 DIAGNOSIS — Z794 Long term (current) use of insulin: Secondary | ICD-10-CM

## 2012-04-20 DIAGNOSIS — R5381 Other malaise: Secondary | ICD-10-CM | POA: Diagnosis not present

## 2012-04-20 DIAGNOSIS — J189 Pneumonia, unspecified organism: Secondary | ICD-10-CM

## 2012-04-20 DIAGNOSIS — R1319 Other dysphagia: Secondary | ICD-10-CM

## 2012-04-20 DIAGNOSIS — R454 Irritability and anger: Secondary | ICD-10-CM

## 2012-04-20 DIAGNOSIS — E871 Hypo-osmolality and hyponatremia: Secondary | ICD-10-CM | POA: Insufficient documentation

## 2012-04-20 DIAGNOSIS — N920 Excessive and frequent menstruation with regular cycle: Secondary | ICD-10-CM

## 2012-04-20 DIAGNOSIS — F1011 Alcohol abuse, in remission: Secondary | ICD-10-CM

## 2012-04-20 DIAGNOSIS — R9431 Abnormal electrocardiogram [ECG] [EKG]: Secondary | ICD-10-CM | POA: Diagnosis present

## 2012-04-20 DIAGNOSIS — K219 Gastro-esophageal reflux disease without esophagitis: Secondary | ICD-10-CM | POA: Diagnosis present

## 2012-04-20 DIAGNOSIS — F329 Major depressive disorder, single episode, unspecified: Secondary | ICD-10-CM

## 2012-04-20 DIAGNOSIS — F39 Unspecified mood [affective] disorder: Secondary | ICD-10-CM

## 2012-04-20 DIAGNOSIS — D509 Iron deficiency anemia, unspecified: Secondary | ICD-10-CM

## 2012-04-20 DIAGNOSIS — J9620 Acute and chronic respiratory failure, unspecified whether with hypoxia or hypercapnia: Secondary | ICD-10-CM | POA: Diagnosis present

## 2012-04-20 DIAGNOSIS — I2721 Secondary pulmonary arterial hypertension: Secondary | ICD-10-CM

## 2012-04-20 DIAGNOSIS — E876 Hypokalemia: Secondary | ICD-10-CM

## 2012-04-20 DIAGNOSIS — J123 Human metapneumovirus pneumonia: Secondary | ICD-10-CM

## 2012-04-20 DIAGNOSIS — R140 Abdominal distension (gaseous): Secondary | ICD-10-CM

## 2012-04-20 DIAGNOSIS — E785 Hyperlipidemia, unspecified: Secondary | ICD-10-CM

## 2012-04-20 DIAGNOSIS — I2510 Atherosclerotic heart disease of native coronary artery without angina pectoris: Secondary | ICD-10-CM

## 2012-04-20 DIAGNOSIS — Z79899 Other long term (current) drug therapy: Secondary | ICD-10-CM

## 2012-04-20 DIAGNOSIS — I5030 Unspecified diastolic (congestive) heart failure: Secondary | ICD-10-CM | POA: Diagnosis present

## 2012-04-20 DIAGNOSIS — J3489 Other specified disorders of nose and nasal sinuses: Secondary | ICD-10-CM

## 2012-04-20 DIAGNOSIS — J80 Acute respiratory distress syndrome: Secondary | ICD-10-CM

## 2012-04-20 DIAGNOSIS — E669 Obesity, unspecified: Secondary | ICD-10-CM

## 2012-04-20 DIAGNOSIS — F3289 Other specified depressive episodes: Secondary | ICD-10-CM

## 2012-04-20 DIAGNOSIS — I10 Essential (primary) hypertension: Secondary | ICD-10-CM

## 2012-04-20 DIAGNOSIS — R197 Diarrhea, unspecified: Secondary | ICD-10-CM

## 2012-04-20 DIAGNOSIS — J9601 Acute respiratory failure with hypoxia: Secondary | ICD-10-CM | POA: Insufficient documentation

## 2012-04-20 DIAGNOSIS — D649 Anemia, unspecified: Secondary | ICD-10-CM | POA: Diagnosis present

## 2012-04-20 DIAGNOSIS — K625 Hemorrhage of anus and rectum: Secondary | ICD-10-CM

## 2012-04-20 DIAGNOSIS — E119 Type 2 diabetes mellitus without complications: Secondary | ICD-10-CM

## 2012-04-20 DIAGNOSIS — F17200 Nicotine dependence, unspecified, uncomplicated: Secondary | ICD-10-CM

## 2012-04-20 DIAGNOSIS — I498 Other specified cardiac arrhythmias: Secondary | ICD-10-CM | POA: Diagnosis present

## 2012-04-20 DIAGNOSIS — A419 Sepsis, unspecified organism: Principal | ICD-10-CM | POA: Diagnosis present

## 2012-04-20 DIAGNOSIS — F319 Bipolar disorder, unspecified: Secondary | ICD-10-CM

## 2012-04-20 DIAGNOSIS — F259 Schizoaffective disorder, unspecified: Secondary | ICD-10-CM | POA: Diagnosis present

## 2012-04-20 DIAGNOSIS — J1289 Other viral pneumonia: Secondary | ICD-10-CM | POA: Diagnosis present

## 2012-04-20 LAB — CBC WITH DIFFERENTIAL/PLATELET
Basophils Absolute: 0 10*3/uL (ref 0.0–0.1)
Eosinophils Absolute: 0 10*3/uL (ref 0.0–0.7)
Eosinophils Relative: 0 % (ref 0–5)
Lymphs Abs: 1.5 10*3/uL (ref 0.7–4.0)
MCH: 28.8 pg (ref 26.0–34.0)
MCV: 82.4 fL (ref 78.0–100.0)
Platelets: 136 10*3/uL — ABNORMAL LOW (ref 150–400)
RDW: 14.9 % (ref 11.5–15.5)

## 2012-04-20 LAB — PRO B NATRIURETIC PEPTIDE: Pro B Natriuretic peptide (BNP): 359.2 pg/mL — ABNORMAL HIGH (ref 0–125)

## 2012-04-20 LAB — INFLUENZA PANEL BY PCR (TYPE A & B)
H1N1 flu by pcr: NOT DETECTED
Influenza A By PCR: NEGATIVE
Influenza B By PCR: NEGATIVE

## 2012-04-20 LAB — COMPREHENSIVE METABOLIC PANEL
AST: 37 U/L (ref 0–37)
BUN: 8 mg/dL (ref 6–23)
CO2: 21 mEq/L (ref 19–32)
Calcium: 8.7 mg/dL (ref 8.4–10.5)
Chloride: 97 mEq/L (ref 96–112)
Creatinine, Ser: 0.82 mg/dL (ref 0.50–1.10)
GFR calc Af Amer: >90 mL/min (ref >90)
GFR calc non Af Amer: 86 mL/min — ABNORMAL LOW (ref >90)
Glucose, Bld: 267 mg/dL — ABNORMAL HIGH (ref 70–99)
Total Bilirubin: 0.1 mg/dL — ABNORMAL LOW (ref 0.3–1.2)

## 2012-04-20 LAB — BASIC METABOLIC PANEL
Calcium: 8.5 mg/dL (ref 8.4–10.5)
GFR calc non Af Amer: >90 mL/min (ref >90)
Glucose, Bld: 198 mg/dL — ABNORMAL HIGH (ref 70–99)
Sodium: 126 mEq/L — ABNORMAL LOW (ref 135–145)

## 2012-04-20 LAB — GLUCOSE, CAPILLARY: Glucose-Capillary: 302 mg/dL — ABNORMAL HIGH (ref 70–99)

## 2012-04-20 LAB — HEMOGLOBIN A1C: Mean Plasma Glucose: 189 mg/dL — ABNORMAL HIGH (ref <117)

## 2012-04-20 MED ORDER — DIVALPROEX SODIUM 250 MG PO DR TAB
250.0000 mg | DELAYED_RELEASE_TABLET | ORAL | Status: DC
  Administered 2012-04-20: 250 mg via ORAL
  Filled 2012-04-20: qty 1

## 2012-04-20 MED ORDER — ONDANSETRON HCL 4 MG/2ML IJ SOLN
4.0000 mg | Freq: Once | INTRAMUSCULAR | Status: AC
  Administered 2012-04-20: 4 mg via INTRAVENOUS
  Filled 2012-04-20: qty 2

## 2012-04-20 MED ORDER — ACETAMINOPHEN 325 MG PO TABS
650.0000 mg | ORAL_TABLET | Freq: Four times a day (QID) | ORAL | Status: DC | PRN
  Administered 2012-04-20: 650 mg via ORAL
  Filled 2012-04-20: qty 2

## 2012-04-20 MED ORDER — ONDANSETRON HCL 4 MG PO TABS: 4.0000 mg | ORAL_TABLET | Freq: Four times a day (QID) | ORAL | Status: DC | PRN

## 2012-04-20 MED ORDER — INSULIN ASPART 100 UNIT/ML ~~LOC~~ SOLN
0.0000 [IU] | Freq: Three times a day (TID) | SUBCUTANEOUS | Status: DC
Start: 2012-04-20 — End: 2012-04-20
  Administered 2012-04-20 (×2): 11 [IU] via SUBCUTANEOUS
  Filled 2012-04-20: qty 1

## 2012-04-20 MED ORDER — NICOTINE 14 MG/24HR TD PT24
14.0000 mg | MEDICATED_PATCH | Freq: Every day | TRANSDERMAL | Status: DC
  Administered 2012-04-20 – 2012-04-24 (×5): 14 mg via TRANSDERMAL
  Filled 2012-04-20 (×5): qty 1

## 2012-04-20 MED ORDER — SODIUM CHLORIDE 0.9 % IV SOLN
INTRAVENOUS | Status: DC
  Administered 2012-04-20 (×2): via INTRAVENOUS
  Administered 2012-04-20: 125 mL/h via INTRAVENOUS

## 2012-04-20 MED ORDER — METOPROLOL TARTRATE 50 MG PO TABS
50.0000 mg | ORAL_TABLET | Freq: Two times a day (BID) | ORAL | Status: DC
  Administered 2012-04-20: 50 mg via ORAL
  Filled 2012-04-20 (×2): qty 1

## 2012-04-20 MED ORDER — INSULIN ASPART 100 UNIT/ML ~~LOC~~ SOLN: 0.0000 [IU] | Freq: Every day | SUBCUTANEOUS | Status: DC

## 2012-04-20 MED ORDER — HYDROCOD POLST-CHLORPHEN POLST 10-8 MG/5ML PO LQCR
5.0000 mL | Freq: Two times a day (BID) | ORAL | Status: DC | PRN
  Administered 2012-04-20: 5 mL via ORAL
  Filled 2012-04-20: qty 5

## 2012-04-20 MED ORDER — LEVALBUTEROL HCL 0.63 MG/3ML IN NEBU
0.6300 mg | INHALATION_SOLUTION | Freq: Four times a day (QID) | RESPIRATORY_TRACT | Status: DC
  Administered 2012-04-20 – 2012-04-21 (×4): 0.63 mg via RESPIRATORY_TRACT
  Filled 2012-04-20 (×4): qty 3

## 2012-04-20 MED ORDER — ALBUTEROL SULFATE (5 MG/ML) 0.5% IN NEBU
2.5000 mg | INHALATION_SOLUTION | Freq: Once | RESPIRATORY_TRACT | Status: AC
  Administered 2012-04-20: 2.5 mg via RESPIRATORY_TRACT
  Filled 2012-04-20: qty 0.5

## 2012-04-20 MED ORDER — MIDAZOLAM HCL 2 MG/2ML IJ SOLN
INTRAMUSCULAR | Status: AC
  Filled 2012-04-20: qty 2

## 2012-04-20 MED ORDER — INSULIN GLARGINE 100 UNIT/ML ~~LOC~~ SOLN: 15.0000 [IU] | Freq: Every day | SUBCUTANEOUS | Status: DC

## 2012-04-20 MED ORDER — SODIUM CHLORIDE 0.9 % IV SOLN
25.0000 ug/h | INTRAVENOUS | Status: DC
  Administered 2012-04-20: 50 ug/h via INTRAVENOUS
  Administered 2012-04-21 – 2012-04-22 (×2): 150 ug/h via INTRAVENOUS
  Administered 2012-04-23: 350 ug/h via INTRAVENOUS
  Administered 2012-04-23: 150 ug/h via INTRAVENOUS
  Administered 2012-04-23 – 2012-04-24 (×3): 350 ug/h via INTRAVENOUS
  Administered 2012-04-24 – 2012-04-25 (×2): 300 ug/h via INTRAVENOUS
  Filled 2012-04-20 (×12): qty 50

## 2012-04-20 MED ORDER — IPRATROPIUM BROMIDE 0.02 % IN SOLN
0.5000 mg | Freq: Once | RESPIRATORY_TRACT | Status: AC
  Administered 2012-04-20: 0.5 mg via RESPIRATORY_TRACT
  Filled 2012-04-20: qty 2.5

## 2012-04-20 MED ORDER — FENTANYL CITRATE 0.05 MG/ML IJ SOLN
INTRAMUSCULAR | Status: AC
  Filled 2012-04-20: qty 50

## 2012-04-20 MED ORDER — ALBUTEROL (5 MG/ML) CONTINUOUS INHALATION SOLN
10.0000 mg/h | INHALATION_SOLUTION | RESPIRATORY_TRACT | Status: DC
  Administered 2012-04-20: 10 mg/h via RESPIRATORY_TRACT
  Filled 2012-04-20: qty 20

## 2012-04-20 MED ORDER — ACETAMINOPHEN 650 MG RE SUPP
650.0000 mg | Freq: Four times a day (QID) | RECTAL | Status: DC | PRN
  Administered 2012-04-21 (×2): 650 mg via RECTAL
  Filled 2012-04-20 (×3): qty 1

## 2012-04-20 MED ORDER — PANTOPRAZOLE SODIUM 40 MG IV SOLR
40.0000 mg | Freq: Every day | INTRAVENOUS | Status: DC
  Administered 2012-04-20 – 2012-04-24 (×5): 40 mg via INTRAVENOUS
  Filled 2012-04-20 (×5): qty 40

## 2012-04-20 MED ORDER — GUAIFENESIN 100 MG/5ML PO SOLN
5.0000 mL | ORAL | Status: DC | PRN
  Administered 2012-04-20: 100 mg via ORAL
  Filled 2012-04-20 (×2): qty 5

## 2012-04-20 MED ORDER — BENZONATATE 100 MG PO CAPS: 200.0000 mg | ORAL_CAPSULE | Freq: Once | ORAL | Status: DC

## 2012-04-20 MED ORDER — MORPHINE SULFATE 2 MG/ML IJ SOLN
2.0000 mg | INTRAMUSCULAR | Status: DC | PRN
  Administered 2012-04-20 (×2): 2 mg via INTRAVENOUS
  Filled 2012-04-20 (×2): qty 1

## 2012-04-20 MED ORDER — PREDNISONE 50 MG PO TABS
60.0000 mg | ORAL_TABLET | Freq: Once | ORAL | Status: AC
  Administered 2012-04-20: 60 mg via ORAL
  Filled 2012-04-20: qty 1

## 2012-04-20 MED ORDER — ONDANSETRON HCL 4 MG/2ML IJ SOLN: 4.0000 mg | Freq: Four times a day (QID) | INTRAMUSCULAR | Status: DC | PRN

## 2012-04-20 MED ORDER — INSULIN ASPART 100 UNIT/ML ~~LOC~~ SOLN
0.0000 [IU] | Freq: Three times a day (TID) | SUBCUTANEOUS | Status: DC
  Administered 2012-04-20: 7 [IU] via SUBCUTANEOUS

## 2012-04-20 MED ORDER — BENZONATATE 100 MG PO CAPS
200.0000 mg | ORAL_CAPSULE | Freq: Once | ORAL | Status: AC
  Administered 2012-04-20: 200 mg via ORAL
  Filled 2012-04-20: qty 2

## 2012-04-20 MED ORDER — PROPOFOL 10 MG/ML IV EMUL
5.0000 ug/kg/min | INTRAVENOUS | Status: DC
  Administered 2012-04-20: 20 ug/kg/min via INTRAVENOUS
  Administered 2012-04-21 – 2012-04-22 (×13): 50 ug/kg/min via INTRAVENOUS
  Administered 2012-04-23: 20 ug/kg/min via INTRAVENOUS
  Administered 2012-04-23: 15 ug/kg/min via INTRAVENOUS
  Filled 2012-04-20 (×15): qty 100

## 2012-04-20 MED ORDER — ETOMIDATE 2 MG/ML IV SOLN
INTRAVENOUS | Status: AC
  Filled 2012-04-20: qty 20

## 2012-04-20 MED ORDER — ENOXAPARIN SODIUM 40 MG/0.4ML ~~LOC~~ SOLN
40.0000 mg | SUBCUTANEOUS | Status: DC
  Administered 2012-04-20 – 2012-05-02 (×13): 40 mg via SUBCUTANEOUS
  Filled 2012-04-20 (×15): qty 0.4

## 2012-04-20 MED ORDER — MIDAZOLAM HCL 50 MG/10ML IJ SOLN
INTRAMUSCULAR | Status: AC
  Filled 2012-04-20: qty 1

## 2012-04-20 MED ORDER — METOPROLOL TARTRATE 50 MG PO TABS
50.0000 mg | ORAL_TABLET | Freq: Two times a day (BID) | ORAL | Status: DC
  Filled 2012-04-20 (×2): qty 1

## 2012-04-20 MED ORDER — FENTANYL BOLUS VIA INFUSION
25.0000 ug | Freq: Four times a day (QID) | INTRAVENOUS | Status: DC | PRN
  Filled 2012-04-20: qty 100

## 2012-04-20 MED ORDER — OSELTAMIVIR PHOSPHATE 75 MG PO CAPS: 75.0000 mg | ORAL_CAPSULE | Freq: Two times a day (BID) | ORAL | Status: DC

## 2012-04-20 MED ORDER — SODIUM CHLORIDE 0.9 % IJ SOLN
3.0000 mL | Freq: Two times a day (BID) | INTRAMUSCULAR | Status: DC
  Administered 2012-04-21 – 2012-05-02 (×21): 3 mL via INTRAVENOUS

## 2012-04-20 MED ORDER — DIVALPROEX SODIUM ER 500 MG PO TB24
500.0000 mg | ORAL_TABLET | Freq: Every day | ORAL | Status: DC
  Filled 2012-04-20: qty 1

## 2012-04-20 MED ORDER — PANTOPRAZOLE SODIUM 40 MG PO TBEC
40.0000 mg | DELAYED_RELEASE_TABLET | Freq: Two times a day (BID) | ORAL | Status: DC
  Administered 2012-04-20: 40 mg via ORAL
  Filled 2012-04-20 (×2): qty 1

## 2012-04-20 MED ORDER — LEVALBUTEROL HCL 0.63 MG/3ML IN NEBU
0.6300 mg | INHALATION_SOLUTION | RESPIRATORY_TRACT | Status: DC | PRN
  Administered 2012-04-20: 0.63 mg via RESPIRATORY_TRACT
  Filled 2012-04-20 (×2): qty 3

## 2012-04-20 MED ORDER — BIOTENE DRY MOUTH MT LIQD
15.0000 mL | Freq: Four times a day (QID) | OROMUCOSAL | Status: DC
  Administered 2012-04-20 – 2012-04-30 (×37): 15 mL via OROMUCOSAL

## 2012-04-20 MED ORDER — SIMVASTATIN 20 MG PO TABS
10.0000 mg | ORAL_TABLET | Freq: Every day | ORAL | Status: DC
  Administered 2012-04-20: 10 mg via ORAL
  Filled 2012-04-20: qty 1

## 2012-04-20 MED ORDER — BENZONATATE 100 MG PO CAPS
100.0000 mg | ORAL_CAPSULE | Freq: Three times a day (TID) | ORAL | Status: DC
  Administered 2012-04-20 (×2): 100 mg via ORAL
  Filled 2012-04-20 (×2): qty 1

## 2012-04-20 MED ORDER — SUCCINYLCHOLINE CHLORIDE 20 MG/ML IJ SOLN
INTRAMUSCULAR | Status: AC
  Filled 2012-04-20: qty 1

## 2012-04-20 MED ORDER — SODIUM CHLORIDE 0.9 % IV BOLUS (SEPSIS)
500.0000 mL | Freq: Once | INTRAVENOUS | Status: AC
  Administered 2012-04-20: 500 mL via INTRAVENOUS

## 2012-04-20 MED ORDER — DIVALPROEX SODIUM 125 MG PO CPSP
250.0000 mg | ORAL_CAPSULE | Freq: Two times a day (BID) | ORAL | Status: DC
  Filled 2012-04-20 (×3): qty 2

## 2012-04-20 MED ORDER — ROCURONIUM BROMIDE 50 MG/5ML IV SOLN
INTRAVENOUS | Status: AC
  Filled 2012-04-20: qty 2

## 2012-04-20 MED ORDER — ZIPRASIDONE HCL 80 MG PO CAPS
80.0000 mg | ORAL_CAPSULE | Freq: Two times a day (BID) | ORAL | Status: DC
  Administered 2012-04-20: 80 mg via ORAL
  Filled 2012-04-20 (×2): qty 1

## 2012-04-20 MED ORDER — LIDOCAINE HCL (CARDIAC) 20 MG/ML IV SOLN
INTRAVENOUS | Status: AC
  Filled 2012-04-20: qty 5

## 2012-04-20 MED ORDER — CHLORHEXIDINE GLUCONATE 0.12 % MT SOLN
15.0000 mL | Freq: Two times a day (BID) | OROMUCOSAL | Status: DC
  Administered 2012-04-20 – 2012-04-29 (×19): 15 mL via OROMUCOSAL
  Filled 2012-04-20 (×20): qty 15

## 2012-04-20 MED ORDER — IPRATROPIUM BROMIDE 0.02 % IN SOLN
0.5000 mg | Freq: Four times a day (QID) | RESPIRATORY_TRACT | Status: DC
  Administered 2012-04-20 – 2012-04-21 (×3): 0.5 mg via RESPIRATORY_TRACT
  Filled 2012-04-20 (×3): qty 2.5

## 2012-04-20 MED ORDER — GUAIFENESIN-CODEINE 100-10 MG/5ML PO SOLN
5.0000 mL | Freq: Once | ORAL | Status: AC
  Administered 2012-04-20: 5 mL via ORAL
  Filled 2012-04-20: qty 5

## 2012-04-20 MED ORDER — LEVOFLOXACIN IN D5W 750 MG/150ML IV SOLN
750.0000 mg | INTRAVENOUS | Status: DC
  Administered 2012-04-20: 750 mg via INTRAVENOUS
  Filled 2012-04-20 (×2): qty 150

## 2012-04-20 MED ORDER — MORPHINE SULFATE 4 MG/ML IJ SOLN
4.0000 mg | Freq: Once | INTRAMUSCULAR | Status: AC
  Administered 2012-04-20: 4 mg via INTRAVENOUS
  Filled 2012-04-20: qty 1

## 2012-04-20 MED ORDER — INSULIN GLARGINE 100 UNIT/ML ~~LOC~~ SOLN: 10.0000 [IU] | Freq: Every day | SUBCUTANEOUS | Status: DC

## 2012-04-20 MED ORDER — MIDAZOLAM HCL 2 MG/2ML IJ SOLN
2.0000 mg | Freq: Once | INTRAMUSCULAR | Status: AC
  Administered 2012-04-20: 4 mg via INTRAVENOUS

## 2012-04-20 NOTE — ED Provider Notes (Addendum)
History     CSN: [id number]  Arrival date & time 04/20/12  0235   First MD Initiated Contact with Patient 04/20/12 [phone]      Chief Complaint  Patient presents with  . Cough    (Consider location/radiation/quality/duration/timing/severity/associated sxs/prior treatment) HPI [name] is a 47 y.o. female with a h/o ASCVD, DM, HTN, depression, schizoaffective d/o  who presents to the Emergency Department complaining of worsening of cough and shortness of breath from earlier today. She has had a cough for two days associated with shortness of breath with exertion. She was seen earlier in the ER and diagnosed with CAP, started on Levaquin. She has taken her Levaquin, used her inhaler and cough medicine with no improvement.She is experiencing increased shortness of breath and wheezing with the cough. She denies fever, chills.   PCP Dr. Everardo Beals   Past Medical History  Diagnosis Date  . Arteriosclerotic cardiovascular disease (ASCVD)     Minimal at cath in [facility].stress nuclear study in 8/08 with nl EF; neg stress echo in 2010  . Diabetes mellitus, type 2 2000    Onset in 2000; no insulin  . Hyperlipidemia   . Hypertension `    during treatment with Geodon  . Gastroesophageal reflux disease     Schatzki's ring  . Anemia, iron deficiency   . Alcohol abuse   . Depression   . Community acquired pneumonia 01/03/10    2011; with pleural eff[facility] resp [facility]  . Obesity   . Community acquired pneumonia 05/2010  . Schizoaffective disorder     requiring multiple psychiatric admissions  . Dysphagia   . DM (diabetes mellitus)     Past Surgical History  Procedure Date  . Dilation and curettage, diagnostic / therapeutic 1992  . Esophagogastroduodenoscopy 09/16/08    Dr. Ronni Rumble hiatal hernia/excoriations involving the cardia and mucosa consistent with trauma, antral erosions  of linear petechiae ? gastritis versus early gastric antral vascular   ectasia.Marland Kitchen biopsy showed reactive gastropathy. No H. pylori.  . Esophagogastroduodenoscopy 09/2007    Dr. Rinaldo Ratel ring, dilated to 56 French Maloney dilator, small hiatal hernia, antral erosions, biopsies reactive gastropathy.  Gaspar Bidding dilation 07/17/2011    [facility] sedation-->distal esophageal stricture s/p dilation, chronic gastritis, multiple ulcers in stomach. no h.pylori  . Colonoscopy 01/2006    internal hemorrhoids  . Colonoscopy 01/10/2012    Procedure: COLONOSCOPY;  Surgeon: Corbin Ade, MD;  Location: [facility];  Service: Endoscopy;  Laterality: N/A;  entered cecum @ [phone] ; total cecal withdrawal time = 8 minutes    Family History  Problem Relation Age of Onset  . Colon cancer Other   . Hypertension Mother   . Stroke Father     deceased at age 46  . Heart disease Sister   . Anesthesia problems Neg Hx   . Hypotension Neg Hx   . Malignant hyperthermia Neg Hx   . Pseudochol deficiency Neg Hx     History  Substance Use Topics  . Smoking status: Current Some Day Smoker -- 1.5 packs/day for 30 years    Types: Cigarettes  . Smokeless tobacco: Not on file     Comment: Sometimes she smokes more; up to 2 packs/day., currently trying to quit (September 2013).  . Alcohol Use: No     Comment: occasional; intake has decreased since she found out she has ulcer, no significant alcohol since April 2013.    OB History    Grav Para Term Preterm  Abortions TAB SAB Ect Mult Living                  Review of Systems  Constitutional: Negative for fever.       10 Systems reviewed and are negative for acute change except as noted in the HPI.  HENT: Negative for congestion.   Eyes: Negative for discharge and redness.  Respiratory: Positive for cough, shortness of breath and wheezing.   Cardiovascular: Negative for chest pain.  Gastrointestinal: Negative for vomiting and abdominal pain.  Musculoskeletal: Negative for back pain.  Skin: Negative for rash.  Neurological: Negative  for syncope, numbness and headaches.  Psychiatric/Behavioral:       No behavior change.    Allergies  Metronidazole; Orange; Shrimp; Penicillins; Sulfonamide derivatives; Glipizide; and Sulfamethoxazole w-trimethoprim  Home Medications   Current Outpatient Rx  Name  Route  Sig  Dispense  Refill  . ALBUTEROL SULFATE HFA 108 (90 BASE) MCG/ACT IN AERS   Inhalation   Inhale 2 puffs into the lungs every 4 (four) hours as needed for wheezing.   1 Inhaler   0   . CETIRIZINE HCL 10 MG PO TABS   Oral   Take 1 tablet (10 mg total) by mouth at bedtime.   30 tablet   0   . DIVALPROEX SODIUM ER 500 MG PO TB24   Oral   Take 1 tablet (500 mg total) by mouth at bedtime.   30 tablet   0   . DIVALPROEX SODIUM 250 MG PO TBEC   Oral   Take 1 tablet (250 mg total) by mouth every morning.   30 tablet   0   . FERROUS GLUCONATE 240 (27 FE) MG PO TABS   Oral   Take 1 tablet (240 mg total) by mouth 2 (two) times daily.   60 tablet   0   . GABAPENTIN 300 MG PO CAPS   Oral   Take 1 capsule (300 mg total) by mouth 3 (three) times daily as needed (anxiety).   90 capsule   0   . HYDROCHLOROTHIAZIDE 25 MG PO TABS   Oral   Take 1 tablet (25 mg total) by mouth daily.   30 tablet   0   . HYDROCODONE-HOMATROPINE 5-1.5 MG/5ML PO SYRP   Oral   Take 5 mLs by mouth every 6 (six) hours as needed for cough.   120 mL   0   . HYDROXYZINE HCL 50 MG PO TABS   Oral   Take 1 tablet (50 mg total) by mouth 3 (three) times daily as needed for anxiety.   30 tablet   0   . IBUPROFEN 200 MG PO TABS   Oral   Take 400 mg by mouth every 8 (eight) hours as needed. Aches and pains, fever         . INSULIN GLARGINE 100 UNIT/ML [place] SOLN   Subcutaneous   Inject 10 Units into the skin at bedtime.   10 mL   0   . LEVOFLOXACIN 500 MG PO TABS   Oral   Take 1 tablet (500 mg total) by mouth daily.   10 tablet   0   . LOVASTATIN 20 MG PO TABS   Oral   Take 1 tablet (20 mg total) by mouth at  bedtime.   90 tablet   1   . METFORMIN HCL 1000 MG PO TABS   Oral   Take 1 tablet (1,000 mg total) by mouth 2 (  two) times daily with a meal.   62 tablet   0   . METOPROLOL TARTRATE 50 MG PO TABS   Oral   Take 1 tablet (50 mg total) by mouth 2 (two) times daily.   62 tablet   11   . MIRTAZAPINE 15 MG PO TABS   Oral   Take 1 tablet (15 mg total) by mouth at bedtime.   30 tablet   0   . MOMETASONE FUROATE 50 MCG/ACT NA SUSP      2 sprays in each nostril daily   17 g   0   . PANTOPRAZOLE SODIUM 40 MG PO TBEC   Oral   Take 1 tablet (40 mg total) by mouth 2 (two) times daily.   30 tablet   0   . TRAZODONE HCL 100 MG PO TABS               . ZIPRASIDONE HCL 80 MG PO CAPS   Oral   Take 1 capsule (80 mg total) by mouth 2 (two) times daily with a meal.   60 capsule   0     BP 103/74  Pulse 135  Temp 100.5 F (38.1 C) (Oral)  Resp 24  SpO2 92%  LMP 04/12/2012  Physical Exam  Nursing note and vitals reviewed. Constitutional: She appears well-developed and well-nourished.       Awake, alert, nontoxic appearance.  HENT:  Head: Atraumatic.  Eyes: Right eye exhibits no discharge. Left eye exhibits no discharge.  Neck: Neck supple.  Cardiovascular: Normal heart sounds.        tachycardia  Pulmonary/Chest: Effort normal. She has wheezes. She exhibits no tenderness.       Coughing continuously with audible wheezing  Abdominal: Soft. There is no tenderness. There is no rebound.  Musculoskeletal: She exhibits no tenderness.       Baseline ROM, no obvious new focal weakness.  Neurological:       Mental status and motor strength appears baseline for patient and situation.  Skin: No rash noted.  Psychiatric: She has a normal mood and affect.    ED Course  Procedures (including critical care time) Results for orders placed during the hospital encounter of 04/20/12  CBC WITH DIFFERENTIAL      Component Value Range   WBC 13.0 (*) 4.0 - 10.5 K/uL   RBC 3.93  3.87  - 5.11 MIL/uL   Hemoglobin 11.3 (*) 12.0 - 15.0 g/dL   HCT 96.0 (*) 45.4 - 09.8 %   MCV 82.4  78.0 - 100.0 fL   MCH 28.8  26.0 - 34.0 pg   MCHC 34.9  30.0 - 36.0 g/dL   RDW 11.9  14.7 - 82.9 %   Platelets 136 (*) 150 - 400 K/uL   Neutrophils Relative 85 (*) 43 - 77 %   Neutro Abs 11.0 (*) 1.7 - 7.7 K/uL   Lymphocytes Relative 12  12 - 46 %   Lymphs Abs 1.5  0.7 - 4.0 K/uL   Monocytes Relative 3  3 - 12 %   Monocytes Absolute 0.4  0.1 - 1.0 K/uL   Eosinophils Relative 0  0 - 5 %   Eosinophils Absolute 0.0  0.0 - 0.7 K/uL   Basophils Relative 0  0 - 1 %   Basophils Absolute 0.0  0.0 - 0.1 K/uL  BASIC METABOLIC PANEL      Component Value Range   Sodium 126 (*) 135 - 145 mEq/L  Potassium 3.6  3.5 - 5.1 mEq/L   Chloride 88 (*) 96 - 112 mEq/L   CO2 23  19 - 32 mEq/L   Glucose, Bld 198 (*) 70 - 99 mg/dL   BUN 9  6 - 23 mg/dL   Creatinine, Ser 4.54  0.50 - 1.10 mg/dL   Calcium 8.5  8.4 - 09.8 mg/dL   GFR calc non Af Amer >90  >90 mL/min   GFR calc Af Amer >90  >90 mL/min    Dg Chest 2 View  04/19/2012  *RADIOLOGY REPORT*  Clinical Data: Cough and shortness of breath.  CHEST - 2 VIEW  Comparison: 03/09/2012  Findings: New right lower lobe airspace disease is seen, consistent with pneumonia.  Left lung remains clear.  No evidence of pleural effusion.  Heart size mediastinal contours are within normal limits.  IMPRESSION: Right lower lobe airspace disease, consistent with pneumonia.  Post- treatment  radiographic followup recommended to confirm resolution.   Original Report Authenticated By: Myles Rosenthal, M.D.      No diagnosis found.  5:03 AM:  T/C to Dr. Rito Ehrlich, hospitalist, case discussed, including:  HPI, pertinent PM/SHx, VS/PE, dx testing, ED course and treatment.  Agreeable to admission.  Requests to write temporary orders, telemetry bed.He will see her in the ER.   MDM  Patient presents with worsening cough and shortness of breath from earlier today when she was seen and  diagnosed with CAP. O2 sats upon arrival were 89% which improved to 92% on 2L [place]. She was given nebulizer treatment with little change. Initiated steroid therapy. Cough continued. Given continuous neb. Cough continued with wheezing. Will arrange for admission.Spoke with Dr. Rito Ehrlich, hospitalist  who will admit the patient.Pt stable in ED with no significant deterioration in condition.The patient appears reasonably stabilized for admission considering the current resources, flow, and capabilities available in the ED at this time, and I doubt any other [facility] requiring further screening and/or treatment in the ED prior to admission.  MDM Reviewed: nursing note, previous chart and vitals Interpretation: labs and x-ray  .CRITICAL CARE Performed by: Annamarie Dawley. Total critical care time: 40 Critical care time was exclusive of separately billable procedures and treating other patients. Critical care was necessary to treat or prevent imminent or life-threatening deterioration. Critical care was time spent personally by me on the following activities: development of treatment plan with patient and/or surrogate as well as nursing, discussions with consultants, evaluation of patient's response to treatment, examination of patient, obtaining history from patient or surrogate, ordering and performing treatments and interventions, ordering and review of laboratory studies, ordering and review of radiographic studies, pulse oximetry and re-evaluation of patient's condition.         Nicoletta Dress. Colon Branch, MD 04/20/12 1191  Nicoletta Dress. Colon Branch, MD 04/20/12 4782  Nicoletta Dress. Colon Branch, MD 04/20/12 [phone]

## 2012-04-20 NOTE — H&P (Signed)
[facility] History and Physical  SHAMEL GALYEAN HQI:[id number] DOB: Aug 15, 1965 DOA: 04/20/2012   PCP: Vernice Jefferson, MD with IM clinic at [facility] Specialists: Dr. Jena Gauss for GERD  Chief Complaint: Worsening shortness of breath  HPI: [name] is a 47 y.o. female with a past medical history of bipolar disorder, diabetes, hypertension, tobacco abuse, who was in her usual state of health till 2 days ago, when she started having a cough, which was dry. She subsequently started getting fever, although she did not check her temperature. This was followed by onset of vomiting. She reports that her son was sick with a cough and cold symptoms a few days ago. She has been wheezing at home. Had some chest tightness, as well which she attributes to her constant cough. She came in to the emergency department yesterday morning, was diagnosed with a pneumonia and was treated with antibiotics and was discharged home with antibiotics. However, the patient's symptoms continued to get worse. She got more and more short of breath and so decided to come back in to the hospital. She has also reported some loose stools. After receiving breathing treatment in the emergency department she is feeling a little better.  Home Medications: Prior to Admission medications   Medication Sig Start Date End Date Taking? Authorizing Provider  albuterol (PROVENTIL HFA;VENTOLIN HFA) 108 (90 BASE) MCG/ACT inhaler Inhale 2 puffs into the lungs every 4 (four) hours as needed for wheezing. 04/19/12  Yes Gilda Crease, MD  cetirizine (ZYRTEC) 10 MG tablet Take 1 tablet (10 mg total) by mouth at bedtime. 03/26/12  Yes Nanine Means, NP  divalproex (DEPAKOTE ER) 500 MG 24 hr tablet Take 1 tablet (500 mg total) by mouth at bedtime. 03/26/12  Yes Nanine Means, NP  divalproex (DEPAKOTE) 250 MG DR tablet Take 1 tablet (250 mg total) by mouth every morning. 03/26/12  Yes Nanine Means, NP  ferrous gluconate (FERGON) 240 (27  FE) MG tablet Take 1 tablet (240 mg total) by mouth 2 (two) times daily. 03/26/12  Yes Nanine Means, NP  gabapentin (NEURONTIN) 300 MG capsule Take 1 capsule (300 mg total) by mouth 3 (three) times daily as needed (anxiety). 03/26/12  Yes Nanine Means, NP  hydrochlorothiazide (HYDRODIURIL) 25 MG tablet Take 1 tablet (25 mg total) by mouth daily. 03/26/12  Yes Nanine Means, NP  HYDROcodone-homatropine (HYCODAN) 5-1.5 MG/5ML syrup Take 5 mLs by mouth every 6 (six) hours as needed for cough. 04/19/12  Yes Gilda Crease, MD  hydrOXYzine (ATARAX/VISTARIL) 50 MG tablet Take 1 tablet (50 mg total) by mouth 3 (three) times daily as needed for anxiety. 03/26/12  Yes Nanine Means, NP  ibuprofen (ADVIL,MOTRIN) 200 MG tablet Take 400 mg by mouth every 8 (eight) hours as needed. Aches and pains, fever   Yes Historical Provider, MD  insulin glargine (LANTUS) 100 UNIT/ML injection Inject 10 Units into the skin at bedtime. 03/26/12  Yes Nanine Means, NP  levofloxacin (LEVAQUIN) 500 MG tablet Take 1 tablet (500 mg total) by mouth daily. 04/19/12  Yes Gilda Crease, MD  lovastatin (MEVACOR) 20 MG tablet Take 1 tablet (20 mg total) by mouth at bedtime. 03/26/12  Yes Nanine Means, NP  metFORMIN (GLUCOPHAGE) 1000 MG tablet Take 1 tablet (1,000 mg total) by mouth 2 (two) times daily with a meal. 03/26/12  Yes Nanine Means, NP  metoprolol (LOPRESSOR) 50 MG tablet Take 1 tablet (50 mg total) by mouth 2 (two) times daily. 03/26/12  Yes Nanine Means, NP  mirtazapine (REMERON) 15 MG tablet Take 1 tablet (15 mg total) by mouth at bedtime. 03/26/12  Yes Nanine Means, NP  mometasone (NASONEX) 50 MCG/ACT nasal spray 2 sprays in each nostril daily 03/26/12  Yes Nanine Means, NP  pantoprazole (PROTONIX) 40 MG tablet Take 1 tablet (40 mg total) by mouth 2 (two) times daily. 03/26/12  Yes Nanine Means, NP  traZODone (DESYREL) 100 MG tablet  03/03/12  Yes Historical Provider, MD  ziprasidone (GEODON) 80 MG capsule Take 1  capsule (80 mg total) by mouth 2 (two) times daily with a meal. 03/26/12  Yes Nanine Means, NP    Allergies:  Allergies  Allergen Reactions  . Metronidazole Shortness Of Breath and Swelling  . Orange Itching  . Shrimp (Shellfish Allergy) Shortness Of Breath and Itching    Takes Benadryl before eating shrimp.  Marland Kitchen Penicillins Hives and Swelling    Fever as well  . Sulfonamide Derivatives Hives    fever  . Glipizide Other (See Comments)    psychosis  . Sulfamethoxazole W-Trimethoprim Rash    Past Medical History: Past Medical History  Diagnosis Date  . Arteriosclerotic cardiovascular disease (ASCVD)     Minimal at cath in [facility].stress nuclear study in 8/08 with nl EF; neg stress echo in 2010  . Diabetes mellitus, type 2 2000    Onset in 2000; no insulin  . Hyperlipidemia   . Hypertension `    during treatment with Geodon  . Gastroesophageal reflux disease     Schatzki's ring  . Anemia, iron deficiency   . Alcohol abuse   . Depression   . Community acquired pneumonia 01/03/10    2011; with pleural effusion-[facility] acute resp failure  . Obesity   . Community acquired pneumonia 05/2010  . Schizoaffective disorder     requiring multiple psychiatric admissions  . Dysphagia   . DM (diabetes mellitus)     Past Surgical History  Procedure Date  . Dilation and curettage, diagnostic / therapeutic 1992  . Esophagogastroduodenoscopy 09/16/08    Dr. Ronni Rumble hiatal hernia/excoriations involving the cardia and mucosa consistent with trauma, antral erosions  of linear petechiae ? gastritis versus early gastric antral vascular  ectasia.Marland Kitchen biopsy showed reactive gastropathy. No H. pylori.  . Esophagogastroduodenoscopy 09/2007    Dr. Rinaldo Ratel ring, dilated to 56 French Maloney dilator, small hiatal hernia, antral erosions, biopsies reactive gastropathy.  Gaspar Bidding dilation 07/17/2011    [facility] sedation-->distal esophageal stricture s/p dilation, chronic  gastritis, multiple ulcers in stomach. no h.pylori  . Colonoscopy 01/2006    internal hemorrhoids  . Colonoscopy 01/10/2012    Procedure: COLONOSCOPY;  Surgeon: Corbin Ade, MD;  Location: [facility];  Service: Endoscopy;  Laterality: N/A;  entered cecum @ [phone] ; total cecal withdrawal time = 8 minutes    Social History:  reports that she has been smoking Cigarettes.  She has a 45 pack-year smoking history. She does not have any smokeless tobacco history on file. She reports that she does not drink alcohol or use illicit drugs.  Living Situation: She lives by herself Activity Level: Usually independent with daily activities   Family History:  Family History  Problem Relation Age of Onset  . Colon cancer Other   . Hypertension Mother   . Stroke Father     deceased at age 45  . Heart disease Sister   . Anesthesia problems Neg Hx   . Hypotension Neg Hx   . Malignant hyperthermia Neg Hx   .  Pseudochol deficiency Neg Hx      Review of Systems - History obtained from the patient General ROS: positive for  - chills and fatigue Psychological ROS: negative Ophthalmic ROS: negative ENT ROS: negative Allergy and Immunology ROS: negative Hematological and Lymphatic ROS: negative Endocrine ROS: negative Respiratory ROS: as in hpi Cardiovascular ROS: as in hpi Gastrointestinal ROS: no abdominal pain, change in bowel habits, or black or bloody stools Genito-Urinary ROS: no dysuria, trouble voiding, or hematuria Musculoskeletal ROS: negative Neurological ROS: no TIA or stroke symptoms Dermatological ROS: negative  Physical Examination  Filed Vitals:   04/20/12 0413 04/20/12 0420 04/20/12 0500 04/20/12 0504  BP: 103/74  136/66 136/66  Pulse: 135  150 147  Temp: 100.5 F (38.1 C)     TempSrc: Oral     Resp: 24  27   SpO2: 3% 92% 95% 100%    General appearance: alert, cooperative, appears stated age and no distress Head: Normocephalic, without obvious abnormality, atraumatic Eyes:  conjunctivae/corneas clear. PERRL, EOM's intact.  Throat: She has dry mucous membranes Neck: no adenopathy, no carotid bruit, no JVD, supple, symmetrical, trachea midline and thyroid not enlarged, symmetric, no tenderness/mass/nodules Back: symmetric, no curvature. ROM normal. No CVA tenderness. Resp: Crackles are heard at the bases, right more than the left. No wheezing is appreciated at this time. Cardio: S1-S2 is tachycardic. Regular. No S3, S4. No rubs, murmurs, bruits. GI: soft, non-tender; bowel sounds normal; no masses,  no organomegaly Extremities: extremities normal, atraumatic, no cyanosis or edema Pulses: 2+ and symmetric Skin: Skin color, texture, turgor normal. No rashes or lesions Lymph nodes: Cervical, supraclavicular, and axillary nodes normal. Neurologic: She is alert and oriented x3. No focal neurological deficits are present.  Laboratory Data: Results for orders placed during the hospital encounter of 04/20/12 (from the past 48 hour(s))  CBC WITH DIFFERENTIAL     Status: Abnormal   Collection Time   04/20/12  4:05 AM      Component Value Range Comment   WBC 13.0 (*) 4.0 - 10.5 K/uL    RBC 3.93  3.87 - 5.11 MIL/uL    Hemoglobin 11.3 (*) 12.0 - 15.0 g/dL    HCT 40.9 (*) 81.1 - 46.0 %    MCV 82.4  78.0 - 100.0 fL    MCH 28.8  26.0 - 34.0 pg    MCHC 34.9  30.0 - 36.0 g/dL    RDW 91.4  78.2 - 95.6 %    Platelets 136 (*) 150 - 400 K/uL    Neutrophils Relative 85 (*) 43 - 77 %    Neutro Abs 11.0 (*) 1.7 - 7.7 K/uL    Lymphocytes Relative 12  12 - 46 %    Lymphs Abs 1.5  0.7 - 4.0 K/uL    Monocytes Relative 3  3 - 12 %    Monocytes Absolute 0.4  0.1 - 1.0 K/uL    Eosinophils Relative 0  0 - 5 %    Eosinophils Absolute 0.0  0.0 - 0.7 K/uL    Basophils Relative 0  0 - 1 %    Basophils Absolute 0.0  0.0 - 0.1 K/uL   BASIC METABOLIC PANEL     Status: Abnormal   Collection Time   04/20/12  4:05 AM      Component Value Range Comment   Sodium 126 (*) 135 - 145 mEq/L     Potassium 3.6  3.5 - 5.1 mEq/L    Chloride 88 (*) 96 - 112  mEq/L    CO2 23  19 - 32 mEq/L    Glucose, Bld 198 (*) 70 - 99 mg/dL    BUN 9  6 - 23 mg/dL    Creatinine, Ser 1.61  0.50 - 1.10 mg/dL    Calcium 8.5  8.4 - 09.6 mg/dL    GFR calc non Af Amer >90  >90 mL/min    GFR calc Af Amer >90  >90 mL/min     Radiology Reports: Dg Chest 2 View  04/19/2012  *RADIOLOGY REPORT*  Clinical Data: Cough and shortness of breath.  CHEST - 2 VIEW  Comparison: 03/09/2012  Findings: New right lower lobe airspace disease is seen, consistent with pneumonia.  Left lung remains clear.  No evidence of pleural effusion.  Heart size mediastinal contours are within normal limits.  IMPRESSION: Right lower lobe airspace disease, consistent with pneumonia.  Post- treatment  radiographic followup recommended to confirm resolution.   Original Report Authenticated By: Myles Rosenthal, M.D.     Electrocardiogram: Pending  Problem List  Principal Problem:  *CAP (community acquired pneumonia) Active Problems:  DIABETES MELLITUS, TYPE II  TOBACCO ABUSE  HYPERTENSION  GASTROESOPHAGEAL REFLUX DISEASE  Acute respiratory failure with hypoxia  Bipolar 1 disorder  Hyponatremia   Assessment: This is a 47 year old, African American female, who presents with worsening shortness of breath. She has failed outpatient treatment for pneumonia and requires admission at this time. She is tachycardic, presumably from the infectious disease, as well as nebulizer treatment. She is also hyponatremic. Differential diagnoses includes influenza.  Plan: #1 community-acquired pneumonia: She'll be treated with levofloxacin. Urine for strep, and Legionella antigens will be checked. She will be admitted to step down unit. Influenza PCR will be checked. We will place her on Tamiflu till we have the results.  #2 acute respiratory failure with hypoxia: She'll be provided with oxygen. Nebulizer treatments will be provided. She doesn't have any  significant wheezing. So, we will hold off on steroids.  #3 Hyponatremia: Most likely from hypovolemia. Will give her IV fluids. Electrolytes will be repeated in the morning.  #4 sinus tachycardia: Most likely due to infectious process, as well as the nebulizer treatments. We will check EKG. As her infection gets better the tachycardia should also improve.  #5 diabetes, type II: Continue with Lantus insulin. Sliding scale insulin coverage will be provided. HbA1c will be checked.  #6 tobacco abuse: Nicotine patch will be provided.  #7 history of bipolar disorder: Continue with her home medication regimen.  #8 history of hypertension: Continue with beta blockers for now.  Further management decisions will depend on results of further testing and patient's response to treatment.  Code Status: She is a full code Family Communication: No family at bedside  Disposition Plan: Unclear for now   [facility]  [facility] Pager [phone]  If 7PM-7AM, please contact night-coverage [email or web address] Password [facility]  04/20/2012, 5:28 AM

## 2012-04-20 NOTE — ED Notes (Signed)
MD at bedside. Dr Rito Ehrlich w/pt

## 2012-04-20 NOTE — Progress Notes (Addendum)
Called by Rn for unrelieved cough. Asked by Dr. Sherrie Mustache to evaluate patient.  Patient continues to cough. She is more short of breath than last night.   Patient is quite tachypneic with RR of 35-40. HR in the 150's, sinus. Sats are 86-88 on 100% NRB. Lungs reveal decreased air entry at the bases.  I believe patient has deteriorated. She will ultimately tire out. Bipap was considered but it is unlikely she will stabilize even with bipap. She needs intubation and control of her airway. I have discussed with the patient and her mother. They are agreeable. Will get CXR after intubation.  Will ask Anesthesia to intubate. Will check tracheal aspirate for culture and repeat influenza. Discussed with Dr. Vassie Loll with PCCM.  Critical care time: 40 mins  [name] 9:19 PM   Patient was intubated. Has transient hypoxia post intubation. Currently sedated with propofol. HR continues to be high. Should improve as respiratory status improves. Bilateral infiltrates noted on CXR. Patient likely in ARDS. ECHO from 2011 showed normal systolic function but grade 2 diastolic dysfunction. Will order ECHO and pro BNP. Decrease IVF rate. Continue Levaquin as this started off as CAP.  Family requesting transfer to [facility]. I have discussed with Dr. Vassie Loll and he accepts in transfer. Defer further management to PCCM.  [name] 11:04 PM

## 2012-04-20 NOTE — ED Notes (Signed)
Assisted pt up to bedside commode, tolerated well

## 2012-04-20 NOTE — Progress Notes (Signed)
Dr Rito Ehrlich paged and made aware that pt has been coughing severely. Tessalon pearl given early, Tussinex is able to be given till 0200. New order received. Pt currently on nonrebreathing O2 sats 90-03%, HR 150's. Will continue to monitor.

## 2012-04-20 NOTE — ED Notes (Signed)
Up to bathroom, tolerated poorly, tired upon return to stretcher. O2 sats remain 96-97% but BP dropped 95/75 with increased pulse rate of 167. Dr.Krishnan aware.

## 2012-04-20 NOTE — Progress Notes (Signed)
CRNA and Dr Rito Ehrlich on floor for intubation.  Per CRNA Amidate and succ given.  At 2144 pt intubated by CRNA. 7.5 ET tube 20 @ the lip. Good color change, breath sounds bilaterally.  Pt sedated with Fentanyl and Propofol.

## 2012-04-20 NOTE — ED Notes (Signed)
Slight improvement, coughing slowed, HR decrease 146,

## 2012-04-20 NOTE — Progress Notes (Signed)
Dr Rito Ehrlich paged and made aware that pt's family wants her to be transferred to [facility]. Family states" they feel like she needs more specialized care than we can give here."  2315 Family updated and made aware that pt can be transferred to [facility], waiting on bed. [place] form signed and placed on chart.  Will continue to monitor.

## 2012-04-20 NOTE — Progress Notes (Signed)
The patient is a 47 year old woman who was admitted this morning by Dr. Rito Ehrlich on for community-acquired pneumonia. She was briefly seen and examined. Her chart, vital signs, and labs were reviewed. We'll continue current management. We'll add Tessalon Perles scheduled and when necessary Tussionex for cough. We'll add Atrovent to Xopenex nebulizers. We'll change her sliding scale to resistive scale. Will increase the dose of the Lantus.

## 2012-04-20 NOTE — ED Notes (Signed)
Patient was seen here yesterday and diagnosed with pneumonia.  Patient states the medicine she was given is not working.  Patient c/o continued cough and shortness of breath secondary to cough.

## 2012-04-21 ENCOUNTER — Inpatient Hospital Stay (HOSPITAL_COMMUNITY): Payer: Medicare Other

## 2012-04-21 ENCOUNTER — Encounter (HOSPITAL_COMMUNITY): Payer: Self-pay | Admitting: Internal Medicine

## 2012-04-21 DIAGNOSIS — J9600 Acute respiratory failure, unspecified whether with hypoxia or hypercapnia: Secondary | ICD-10-CM

## 2012-04-21 DIAGNOSIS — J9589 Other postprocedural complications and disorders of respiratory system, not elsewhere classified: Secondary | ICD-10-CM

## 2012-04-21 DIAGNOSIS — A419 Sepsis, unspecified organism: Secondary | ICD-10-CM

## 2012-04-21 DIAGNOSIS — J189 Pneumonia, unspecified organism: Secondary | ICD-10-CM

## 2012-04-21 LAB — BLOOD GAS, ARTERIAL
Acid-base deficit: 2.5 mmol/L — ABNORMAL HIGH (ref 0.0–2.0)
Bicarbonate: 22.3 mEq/L (ref 20.0–24.0)
Bicarbonate: 22.2 mEq/L (ref 20.0–24.0)
FIO2: 1 %
FIO2: 0.6 %
MECHVT: 400 mL
O2 Saturation: 99 %
O2 Saturation: 98.4 %
RATE: 16 resp/min
RATE: 30 resp/min
TCO2: 23.5 mmol/L (ref 0–100)
pH, Arterial: 7.388 (ref 7.350–7.450)
pO2, Arterial: 183 mmHg — ABNORMAL HIGH (ref 80.0–100.0)
pO2, Arterial: 87.4 mmHg (ref 80.0–100.0)

## 2012-04-21 LAB — GLUCOSE, CAPILLARY
Glucose-Capillary: 143 mg/dL — ABNORMAL HIGH (ref 70–99)
Glucose-Capillary: 156 mg/dL — ABNORMAL HIGH (ref 70–99)
Glucose-Capillary: 174 mg/dL — ABNORMAL HIGH (ref 70–99)
Glucose-Capillary: 97 mg/dL (ref 70–99)

## 2012-04-21 LAB — CARBOXYHEMOGLOBIN
Carboxyhemoglobin: 1.2 % (ref 0.5–1.5)
Methemoglobin: 1 % (ref 0.0–1.5)
O2 Saturation: 80 %

## 2012-04-21 LAB — LACTIC ACID, PLASMA: Lactic Acid, Venous: 0.7 mmol/L (ref 0.5–2.2)

## 2012-04-21 LAB — COMPREHENSIVE METABOLIC PANEL
ALT: 8 U/L (ref 0–35)
Calcium: 7.9 mg/dL — ABNORMAL LOW (ref 8.4–10.5)
GFR calc Af Amer: >90 mL/min (ref >90)
Glucose, Bld: 167 mg/dL — ABNORMAL HIGH (ref 70–99)
Sodium: 133 mEq/L — ABNORMAL LOW (ref 135–145)
Total Protein: 5.6 g/dL — ABNORMAL LOW (ref 6.0–8.3)

## 2012-04-21 LAB — URINALYSIS, ROUTINE W REFLEX MICROSCOPIC
Glucose, UA: NEGATIVE mg/dL
Specific Gravity, Urine: 1.023 (ref 1.005–1.030)

## 2012-04-21 LAB — PROTIME-INR
INR: 1.04 (ref 0.00–1.49)
Prothrombin Time: 13.5 seconds (ref 11.6–15.2)

## 2012-04-21 LAB — APTT: aPTT: 41 seconds — ABNORMAL HIGH (ref 24–37)

## 2012-04-21 LAB — CBC
Hemoglobin: 10.9 g/dL — ABNORMAL LOW (ref 12.0–15.0)
MCH: 27.9 pg (ref 26.0–34.0)
MCHC: 33.1 g/dL (ref 30.0–36.0)
Platelets: 134 10*3/uL — ABNORMAL LOW (ref 150–400)

## 2012-04-21 LAB — MAGNESIUM: Magnesium: 1.7 mg/dL (ref 1.5–2.5)

## 2012-04-21 LAB — PROCALCITONIN: Procalcitonin: 1.21 ng/mL

## 2012-04-21 LAB — TROPONIN I: Troponin I: <0.3 ng/mL (ref <0.30)

## 2012-04-21 LAB — HIV ANTIBODY (ROUTINE TESTING W REFLEX): HIV: NONREACTIVE

## 2012-04-21 LAB — CORTISOL: Cortisol, Plasma: 13.7 ug/dL

## 2012-04-21 LAB — URINE MICROSCOPIC-ADD ON

## 2012-04-21 LAB — INFLUENZA PANEL BY PCR (TYPE A & B): Influenza A By PCR: NEGATIVE

## 2012-04-21 MED ORDER — VANCOMYCIN HCL IN DEXTROSE 1-5 GM/200ML-% IV SOLN
1000.0000 mg | Freq: Once | INTRAVENOUS | Status: AC
  Administered 2012-04-21: 1000 mg via INTRAVENOUS
  Filled 2012-04-21: qty 200

## 2012-04-21 MED ORDER — DEXTROSE 5 % IV SOLN
2.0000 ug/min | INTRAVENOUS | Status: DC | PRN
  Filled 2012-04-21: qty 4

## 2012-04-21 MED ORDER — INSULIN ASPART 100 UNIT/ML ~~LOC~~ SOLN
0.0000 [IU] | SUBCUTANEOUS | Status: DC
  Administered 2012-04-21 – 2012-04-22 (×6): 2 [IU] via SUBCUTANEOUS
  Administered 2012-04-22 – 2012-04-23 (×7): 1 [IU] via SUBCUTANEOUS
  Administered 2012-04-23 (×2): 2 [IU] via SUBCUTANEOUS
  Administered 2012-04-24: 3 [IU] via SUBCUTANEOUS
  Administered 2012-04-24: 1 [IU] via SUBCUTANEOUS
  Administered 2012-04-24: 3 [IU] via SUBCUTANEOUS
  Administered 2012-04-24: 1 [IU] via SUBCUTANEOUS
  Administered 2012-04-24 – 2012-04-25 (×3): 2 [IU] via SUBCUTANEOUS
  Administered 2012-04-25 (×2): 3 [IU] via SUBCUTANEOUS
  Administered 2012-04-25: 5 [IU] via SUBCUTANEOUS
  Administered 2012-04-25: 2 [IU] via SUBCUTANEOUS
  Administered 2012-04-25: 3 [IU] via SUBCUTANEOUS
  Administered 2012-04-26: 5 [IU] via SUBCUTANEOUS
  Administered 2012-04-26: 7 [IU] via SUBCUTANEOUS
  Administered 2012-04-26: 3 [IU] via SUBCUTANEOUS
  Administered 2012-04-26 (×2): 7 [IU] via SUBCUTANEOUS
  Administered 2012-04-26: 5 [IU] via SUBCUTANEOUS
  Administered 2012-04-26: 3 [IU] via SUBCUTANEOUS
  Administered 2012-04-27 (×2): 5 [IU] via SUBCUTANEOUS
  Administered 2012-04-27: 7 [IU] via SUBCUTANEOUS
  Administered 2012-04-27: 3 [IU] via SUBCUTANEOUS
  Administered 2012-04-27 – 2012-04-28 (×4): 2 [IU] via SUBCUTANEOUS

## 2012-04-21 MED ORDER — SODIUM CHLORIDE 0.9 % IV BOLUS (SEPSIS): 500.0000 mL | INTRAVENOUS | Status: DC | PRN

## 2012-04-21 MED ORDER — DEXTROSE 5 % IV SOLN
500.0000 mg | INTRAVENOUS | Status: DC
  Administered 2012-04-21 – 2012-04-24 (×4): 500 mg via INTRAVENOUS
  Filled 2012-04-21 (×4): qty 500

## 2012-04-21 MED ORDER — SODIUM CHLORIDE 0.9 % IV SOLN
500.0000 mg | Freq: Once | INTRAVENOUS | Status: AC
  Administered 2012-04-21: 500 mg via INTRAVENOUS
  Filled 2012-04-21: qty 500

## 2012-04-21 MED ORDER — SODIUM CHLORIDE 0.9 % IV SOLN
500.0000 mg | Freq: Four times a day (QID) | INTRAVENOUS | Status: DC
  Administered 2012-04-21 – 2012-04-24 (×13): 500 mg via INTRAVENOUS
  Filled 2012-04-21 (×14): qty 500

## 2012-04-21 MED ORDER — VANCOMYCIN HCL IN DEXTROSE 1-5 GM/200ML-% IV SOLN
1000.0000 mg | Freq: Three times a day (TID) | INTRAVENOUS | Status: DC
  Administered 2012-04-21 – 2012-04-22 (×5): 1000 mg via INTRAVENOUS
  Filled 2012-04-21 (×6): qty 200

## 2012-04-21 MED ORDER — INSULIN ASPART 100 UNIT/ML ~~LOC~~ SOLN: 0.0000 [IU] | SUBCUTANEOUS | Status: DC

## 2012-04-21 MED ORDER — SODIUM CHLORIDE 0.9 % IV BOLUS (SEPSIS)
25.0000 mL/kg | Freq: Once | INTRAVENOUS | Status: DC
  Administered 2012-04-21: 1168 mL via INTRAVENOUS

## 2012-04-21 MED ORDER — METOPROLOL TARTRATE 25 MG/10 ML ORAL SUSPENSION
25.0000 mg | Freq: Two times a day (BID) | ORAL | Status: DC
  Administered 2012-04-21 – 2012-04-26 (×10): 25 mg via ORAL
  Filled 2012-04-21 (×11): qty 10

## 2012-04-21 MED ORDER — METOPROLOL TARTRATE 25 MG PO TABS: 25.0000 mg | ORAL_TABLET | Freq: Two times a day (BID) | ORAL | Status: DC

## 2012-04-21 MED ORDER — SODIUM CHLORIDE 0.9 % IV SOLN
INTRAVENOUS | Status: DC
  Administered 2012-04-21: 06:00:00 via INTRAVENOUS

## 2012-04-21 MED ORDER — ACETAMINOPHEN 160 MG/5ML PO SOLN
650.0000 mg | Freq: Four times a day (QID) | ORAL | Status: DC | PRN
  Administered 2012-04-22: 650 mg via ORAL
  Filled 2012-04-21: qty 20.3

## 2012-04-21 MED ORDER — SODIUM CHLORIDE 0.9 % IV BOLUS (SEPSIS): 1000.0000 mL | Freq: Once | INTRAVENOUS | Status: DC

## 2012-04-21 MED ORDER — INSULIN ASPART 100 UNIT/ML ~~LOC~~ SOLN
1.0000 [IU] | SUBCUTANEOUS | Status: DC
  Administered 2012-04-21: 2 [IU] via SUBCUTANEOUS

## 2012-04-21 MED ORDER — SODIUM CHLORIDE 0.9 % IV SOLN
INTRAVENOUS | Status: DC
  Filled 2012-04-21: qty 1

## 2012-04-21 MED ORDER — DOBUTAMINE IN D5W 4-5 MG/ML-% IV SOLN: 2.5000 ug/kg/min | INTRAVENOUS | Status: DC | PRN

## 2012-04-21 NOTE — Procedures (Signed)
Central Venous Catheter Insertion Procedure Note ANACAROLINA EVELYN [id number] [birth year]  Procedure: Insertion of Central Venous Catheter Indications: Assessment of intravascular volume, Drug and/or fluid administration and Frequent blood sampling  Procedure Details Consent: Unable to obtain consent because of emergent medical necessity. Time Out: Verified patient identification, verified procedure, site/side was marked, verified correct patient position, special equipment/implants available, medications/allergies/relevent history reviewed, required imaging and test results available.  Performed  Maximum sterile technique was used including antiseptics, cap, gloves, gown, hand hygiene, mask and sheet. Skin prep: Chlorhexidine; local anesthetic administered A antimicrobial bonded/coated triple lumen catheter was placed in the left subclavian vein using the Seldinger technique.  Evaluation Blood flow good Complications: No apparent complications Patient did tolerate procedure well. Chest X-ray ordered to verify placement.  CXR: pending.  Overton Mam, M.D. Pulmonary and Critical Care Medicine Call E-link with questions [phone] 04/21/2012, 4:49 AM

## 2012-04-21 NOTE — Progress Notes (Signed)
RT placed patient on her 7cc VT of per ARDS protocol and RT increased RR to 30 to meet baseline MVE. RT will continue to monitor to attempt patient's 6ccs VT.

## 2012-04-21 NOTE — Progress Notes (Signed)
eLink Physician-Brief Progress Note Patient Name: [name] DOB: 1965/08/13 MRN: [id number]  Date of Service  04/21/2012   HPI/Events of Note   Pt tachycardic and HTNive, pt with ARDS protocol  eICU Interventions  Start lopressor and change to ARDS protocol   Intervention Category Major Interventions: Respiratory failure - evaluation and management;Hypertension - evaluation and management  Shan Levans 04/21/2012, 4:18 PM

## 2012-04-21 NOTE — Progress Notes (Signed)
Pts heart rate remains elevated in 150s. Temp 103. Tylenol given at 0800. Dr Delton Coombes paged at [phone]. Cooling blanket on at 0830. BP,  Hgb stable.

## 2012-04-21 NOTE — H&P (Addendum)
PULMONARY  / CRITICAL CARE MEDICINE  Name: RUPINDER LIVINGSTON MRN: [id number] DOB: 02-28-1966    LOS: 1  REFERRING MD :  Osvaldo Shipper  CHIEF COMPLAINT:  Shortness of breath, CAP  BRIEF PATIENT DESCRIPTION: Ms. ZIARE ORRICK is a 47 y.o., female with a PMHX of DM2 (A1c 8.2), HLD, bipolar disorder, schizoaffective disorder, and alcohol abuse who is admitted to [facility] on 04/21/2012 on transfer from [facility] (admitted on 04/20/2012) with severe CAP in setting of acute respiratory failure requiring intubation on 04/20/2012. Of note, she is flu negative.  LINES / TUBES: ETT 01/05 >>> PIV left forearm 01/05 >>> PIV left forearm 01/05 >>> OG tube 01/05 >>> LSC 1/6>>>  CULTURES: Respiratory culture 01/06 Influenza panel 01/05 >> NEGATIVE Urine strep 01/05 >> NEGATIVE Urine legionella 01/05 >>>  Blood cx x2 1/6>>> Urine cx 1/6>>>  ANTIBIOTICS: Levaquin 01/04 >> 01/06 Vancomycin 01/06 >>> Primaxin 01/06 >>>  SIGNIFICANT EVENTS:  01/05 - [facility] with [facility] 01/06 - Respiratory distress requiring intubation. [facility]   LEVEL OF CARE:  ICU PRIMARY SERVICE:  PCCM CONSULTANTS:  None CODE STATUS: FULL DIET:  NPO DVT Px:  Lovenox GI Px:  Protonix  HISTORY OF PRESENT ILLNESS:   Ms. ZARYAH SECKEL is a 47 y.o. African American female with a PMHX of DM2 (A1c 8.2), HLD, bipolar disorder, depression, alcohol abuse, and current tobacco abuse who is admitted to [facility] on 04/21/2012 upon transfer from [facility] (admitted on 04/20/2012) with CAP in setting of acute respiratory failure requiring intubation on 04/20/2012. She initially presented to the ED on 04/20/11 with complaints of shortness of breath, non-productive cough, fevers, and vomiting. She was discharged home from the ED on Levaquin for RLL PNA. She subsequently returned to [facility] on 04/21/11 with worsening similar symptoms.   At [facility] she was continued on Levofloxacin and started on Tamiflu.  Influenza PCR negative, and urine strep pneumoniae antigen negative. She continued to deteriorate, becoming more tachypneic and tachycardia HR 150s, with decreased o2 saturation. Repeat cxr showed bilateral infiltrates. She was subsequently intubated on 04/21/11 and transferred to [facility].   PAST MEDICAL HISTORY :  Past Medical History  Diagnosis Date  . Arteriosclerotic cardiovascular disease (ASCVD)     Minimal at cath in [facility]stress nuclear study in 8/08 with nl EF; neg stress echo in 2010  . Diabetes mellitus, type 2 2000    Onset in 2000; no insulin  . Hyperlipidemia   . Hypertension `    during treatment with Geodon  . Gastroesophageal reflux disease     Schatzki's ring  . Anemia, iron deficiency   . Alcohol abuse   . Depression   . Community acquired pneumonia 01/03/10    2011; with pleural [facility] acute resp failure  . Obesity   . Community acquired pneumonia 05/2010  . Schizoaffective disorder     requiring multiple psychiatric admissions  . Dysphagia   . Diastolic dysfunction     grade 2 per echo 2011   Past Surgical History  Procedure Date  . Dilation and curettage, diagnostic / therapeutic 1992  . Esophagogastroduodenoscopy 09/16/08    Dr. Ronni Rumble hiatal hernia/excoriations involving the cardia and mucosa consistent with trauma, antral erosions  of linear petechiae ? gastritis versus early gastric antral vascular  ectasia.Marland Kitchen biopsy showed reactive gastropathy. No H. pylori.  . Esophagogastroduodenoscopy 09/2007    Dr. Rinaldo Ratel ring, dilated to 56 French Maloney dilator, small hiatal  hernia, antral erosions, biopsies reactive gastropathy.  Gaspar Bidding dilation 07/17/2011    [facility] sedation-->distal esophageal stricture s/p dilation, chronic gastritis, multiple ulcers in stomach. no h.pylori  . Colonoscopy 01/2006    internal hemorrhoids  . Colonoscopy 01/10/2012    Procedure: COLONOSCOPY;  Surgeon: Corbin Ade, MD;  Location: [facility];   Service: Endoscopy;  Laterality: N/A;  entered cecum @ [phone] ; total cecal withdrawal time = 8 minutes    Prior to Admission medications  Medication Sig  albuterol (PROVENTIL HFA;VENTOLIN HFA) 108 (90 BASE) MCG/ACT inhaler Inhale 2 puffs into the lungs every 4 (four) hours as needed for wheezing.  cetirizine (ZYRTEC) 10 MG tablet Take 1 tablet (10 mg total) by mouth at bedtime.  divalproex (DEPAKOTE ER) 500 MG 24 hr tablet Take 1 tablet (500 mg total) by mouth at bedtime.  divalproex (DEPAKOTE) 250 MG DR tablet Take 1 tablet (250 mg total) by mouth every morning.  ferrous gluconate (FERGON) 240 (27 FE) MG tablet Take 1 tablet (240 mg total) by mouth 2 (two) times daily.  gabapentin (NEURONTIN) 300 MG capsule Take 1 capsule (300 mg total) by mouth 3 (three) times daily as needed (anxiety).  hydrochlorothiazide (HYDRODIURIL) 25 MG tablet Take 1 tablet (25 mg total) by mouth daily.  HYDROcodone-homatropine (HYCODAN) 5-1.5 MG/5ML syrup Take 5 mLs by mouth every 6 (six) hours as needed for cough.  hydrOXYzine (ATARAX/VISTARIL) 50 MG tablet Take 1 tablet (50 mg total) by mouth 3 (three) times daily as needed for anxiety.  ibuprofen (ADVIL,MOTRIN) 200 MG tablet Take 400 mg by mouth every 8 (eight) hours as needed. Aches and pains, fever  insulin glargine (LANTUS) 100 UNIT/ML injection Inject 10 Units into the skin at bedtime.  levofloxacin (LEVAQUIN) 500 MG tablet Take 1 tablet (500 mg total) by mouth daily.  lovastatin (MEVACOR) 20 MG tablet Take 1 tablet (20 mg total) by mouth at bedtime.  metFORMIN (GLUCOPHAGE) 1000 MG tablet Take 1 tablet (1,000 mg total) by mouth 2 (two) times daily with a meal.  metoprolol (LOPRESSOR) 50 MG tablet Take 1 tablet (50 mg total) by mouth 2 (two) times daily.  mirtazapine (REMERON) 15 MG tablet Take 1 tablet (15 mg total) by mouth at bedtime.  mometasone (NASONEX) 50 MCG/ACT nasal spray 2 sprays in each nostril daily  pantoprazole (PROTONIX) 40 MG tablet Take 1 tablet  (40 mg total) by mouth 2 (two) times daily.  traZODone (DESYREL) 100 MG tablet Take 100 mg by mouth at bedtime.  ziprasidone (GEODON) 80 MG capsule Take 1 capsule (80 mg total) by mouth 2 (two) times daily with a meal.    Allergies  Allergen Reactions  . Metronidazole Shortness Of Breath and Swelling  . Orange Itching  . Shrimp (Shellfish Allergy) Shortness Of Breath and Itching    Takes Benadryl before eating shrimp.  Marland Kitchen Penicillins Hives and Swelling    Fever as well  . Sulfonamide Derivatives Hives    fever  . Glipizide Other (See Comments)    psychosis  . Sulfamethoxazole W-Trimethoprim Rash    FAMILY HISTORY:  Family History  Problem Relation Age of Onset  . Colon cancer Other   . Hypertension Mother   . Stroke Father     deceased at age 50  . Heart disease Sister   . Anesthesia problems Neg Hx   . Hypotension Neg Hx   . Malignant hyperthermia Neg Hx   . Pseudochol deficiency Neg Hx    SOCIAL HISTORY:  reports that she has  been smoking Cigarettes.  She has a 45 pack-year smoking history. She does not have any smokeless tobacco history on file. She reports that she does not drink alcohol or use illicit drugs.  REVIEW OF SYSTEMS:  Cannot be obtained as patient is intubated and sedated.   INTERVAL HISTORY:  01/06 - Intubated in setting of respiratory distress and transferred to [facility].  LSC placed.   VITAL SIGNS: Temp:  [97.9 F (36.6 C)-102.3 F (39.1 C)] 102.3 F (39.1 C) (01/06 0115) Pulse Rate:  [105-158] 150  (01/06 0238) Resp:  [18-38] 33  (01/06 0238) BP: (95-197)/(57-105) 113/80 mmHg (01/06 0100) SpO2:  [3 %-100 %] 100 % (01/06 0238) FiO2 (%):  [50 %-100 %] 100 % (01/06 0238) Weight:  [191 lb 2.2 oz (86.7 kg)] 191 lb 2.2 oz (86.7 kg) (01/05 1137) HEMODYNAMICS:   VENTILATOR SETTINGS: Vent Mode:  [-] PRVC FiO2 (%):  [50 %-100 %] 100 % Set Rate:  [14 bmp-16 bmp] 16 bmp Vt Set:  [490 mL-500 mL] 490 mL PEEP:  [10 cmH20] 10 cmH20 Plateau Pressure:  [22  cmH20] 22 cmH20 INTAKE / OUTPUT: Intake/Output      01/05 0701 - 01/06 0700   P.O. 890   I.V. (mL/kg) 1673.6 (19.3)   Total Intake(mL/kg) 2563.6 (29.6)   Urine (mL/kg/hr) 2150 (1)   Total Output 2150   Net +413.6         PHYSICAL EXAMINATION: General: Intubated on mechanical ventilation and sedated  Neuro: sedated  HEENT: B/l pin point pupils  Cardiovascular: Tachycardia, -M/R/G  Lungs: Coarse b/l breath sounds  Abdomen: +bs, soft, obese  Musculoskeletal: Mild edema b/l lower extremities, +2dp b/l  Skin: Dry, warm  LABS: CBC  Lab 04/20/12 0405  WBC 13.0*  HGB 11.3*  HCT 32.4*  PLT 136*   Chemistry  Lab 04/20/12 1426 04/20/12 0405  NA 133* 126*  K 3.8 3.6  CL 97 88*  CO2 21 23  BUN 8 9  CREATININE 0.82 0.75  CALCIUM 8.7 8.5  MG -- --  PHOS -- --  GLUCOSE 267* 198*   Liver function  Lab 04/20/12 1426  AST 37  ALT 10  ALKPHOS 60  BILITOT 0.1*  PROT 6.4  ALBUMIN 2.9*   BNP  Lab 04/20/12 2303  PROBNP 359.2*   ABG  Lab 04/21/12 0342  PHART 7.313*  PCO2ART 46.8*  PO2ART 183.0*  HCO3 22.3  TCO2 23.5    CBG trend  Lab 04/21/12 0001 04/20/12 2054 04/20/12 1621 04/20/12 1142 04/20/12 0909  GLUCAP 143* 97 237* 302* 317*    IMAGING:  CXR (04/19/2012) - Right lower lobe airspace disease, consistent with pneumonia. Post- treatment radiographic followup recommended to confirm resolution.  CXR 04/22/11: b/l airspace opacities appear similar with slightly reduced lung volumes.  B/L pna vs pulmonary edema remain in differentials.    ECG: 04/21/11: 130bpm sinus tachycardia, prolonged qtc  DIAGNOSES: Principal Problem:  *ARDS (adult respiratory distress syndrome) Active Problems:  DIABETES MELLITUS, TYPE II  TOBACCO ABUSE  HYPERTENSION  GASTROESOPHAGEAL REFLUX DISEASE  Diastolic heart failure  CAP (community acquired pneumonia)  Acute respiratory failure with hypoxia  Bipolar 1 disorder  Hyponatremia  ASSESSMENT /  PLAN:  PULMONARY Assessment:  1) VDRF - secondary to acute on chronic respiratory failure in setting of CAP. 2) ARDS Plan:   - DC Levaquin. - Start Vancomycin and Primaxin per pharmacy.  - Mechanical ventilation. PRVC, Vt 6cc/kg. PEEP: 5 RR:18, FiO2 100% - VAP prevention order set - AM CXR. -  Obtain tracheal aspirate--influenza pcr  CARDIOVASCULAR Assessment:  1) Sinus tachycardia - likely secondary to volume depletion in setting of sepsis in addition to her acute respiratory failure. 2) Nonobstructive CAD - per cath in 2007.  3) Hyperlipidemia - goal LDL < 100mg  /dL 4) Hypertension - on home metoprolol.  5) Prolonged QT - likely contributed by her home Geodon and trazadone 6) Grade 2 diastolic dysfunction - per echocardiogram 2011, no evidence of overt fluid overload at this time. Plan:   - IVF per sepsis, early goal directed therapy order set. - Check EKG.  - Repeat 2-D echocardiogram. - hold home dose metoprolol - Placed left subclavian line, monitor CVP  RENAL Assessment: 1) Pseudohyponatremia - admission Na 133, however, this corrects for her glucose of 267. Plan:   - Continue to monitor renal function  GASTROINTESTINAL Assessment: No acute issues Plan:   - GI prophylaxis with protonix  HEMATOLOGIC Assessment: 1) Chronic normocytic anemia - BL Hgb within normal limits in late 2013, previously 10-11. Hgb stable on admission, no indication of acute blood loss.  Plan:   - Continue to monitor CBC - DVT prophylaxis with Lovenox  INFECTIOUS Assessment: 1) Sepsis secondary to severe CAP - on admission had already been treated with 2 days of levaquin. Of note urine strep is negative, and urine legionella are pending. Plan:   - Start Primaxin. - Start Vancomycin. - Code sepsis initiated. - Obtain tracheal aspirate for culture and to check for influenza - F/U urine legionella - blood cx x2 - urine cx/ua  ENDOCRINE Assessment: 1) Diabetes mellitus type 2, A1c 8.2 -  only on oral meds at home. Plan:   - Start ICU hyperglycemia protocol  NEUROLOGIC / PSYCHIATRIC Assessment:  1) Bipolar disorder 2) Schizoaffective disorder 3) History of alcohol abuse - family states this is currently in remission. Plan:   - Hold home geodon and trazodone in setting of prolonged QTc - discontinued depakote   CLINICAL SUMMARY: Ms. HELYN SCHWAN is a 47 y.o., female with a PMHX of DM2 (A1c 8.2), HLD, schizoaffective disorder, and alcohol abuse who is admitted to [facility] on 04/21/2012 on transfer from [facility] (admitted on 04/20/2012) with severe CAP in setting of acute respiratory failure requiring intubation on 04/20/2012.  Signed: Darden Palmer, MD PGY-I, Internal Medicine Resident 04/21/2012, 4:36 AM    Case and plan of care was discussed with PCCM fellow, Dr. Overton Mam.   I have personally obtained a history, examined the patient, evaluated laboratory and imaging results, formulated the assessment and plan and placed orders.  CRITICAL CARE: The patient is critically ill with multiple organ systems failure and requires high complexity decision making for assessment and support, frequent evaluation and titration of therapies, application of advanced monitoring technologies and extensive interpretation of multiple databases. Critical Care Time devoted to patient care services described in this note is 60 minutes.   Overton Mam, M.D. Pulmonary and Critical Care Medicine [facility] Pager: [phone]  04/21/2012, 4:48 AM

## 2012-04-21 NOTE — Progress Notes (Signed)
ET tube to be advanced 1 cm.   CXr to follow.

## 2012-04-21 NOTE — Progress Notes (Signed)
RT called and notified that per Elink MD to advance ET tube 2 cm.

## 2012-04-21 NOTE — Progress Notes (Signed)
PULMONARY  / CRITICAL CARE MEDICINE  Name: [name] MRN: [id number] DOB: 03/26/1966    LOS: 1  REFERRING MD :  Osvaldo Shipper  CHIEF COMPLAINT:  Shortness of breath, CAP  BRIEF PATIENT DESCRIPTION: [name] is a 47 y.o., female with a PMHX of DM2 (A1c 8.2), HLD, bipolar disorder, schizoaffective disorder, and alcohol abuse who is admitted to [facility] on 04/21/2012 on transfer from [facility] (admitted on 04/20/2012) with severe CAP in setting of acute respiratory failure requiring intubation on 04/20/2012. Of note, she is flu negative on nasal swab.  LINES / TUBES: ETT 01/05 >>> PIV left forearm 01/05 >>> PIV left forearm 01/05 >>> OG tube 01/05 >>> L Lamb CVC 1/6>>>  CULTURES: Respiratory culture 01/06 Influenza panel 01/05 >> NEGATIVE Urine strep 01/05 >> NEGATIVE Urine legionella 01/05 >>>  Blood cx x2 1/6>>> Urine cx 1/6>>>  ANTIBIOTICS: Levaquin 01/04 >> 01/06 Vancomycin 01/06 >>> Primaxin 01/06 >>> azithro 1/6 >>   SIGNIFICANT EVENTS:  01/05 - [facility] with [facility] 01/06 - Respiratory distress requiring intubation. [facility]   LEVEL OF CARE:  ICU PRIMARY SERVICE:  PCCM CONSULTANTS:  None CODE STATUS: FULL DIET:  NPO DVT Px:  Lovenox GI Px:  Protonix  HISTORY OF PRESENT ILLNESS:   [name] is a 46 y.o. African American female with a PMHX of DM2 (A1c 8.2), HLD, bipolar disorder, depression, alcohol abuse, and current tobacco abuse who is admitted to [facility] on 04/21/2012 upon transfer from [facility] (admitted on 04/20/2012) with CAP in setting of acute respiratory failure requiring intubation on 04/20/2012. She initially presented to [facility] ED on 04/20/11 with complaints of shortness of breath, non-productive cough, fevers, and vomiting. She was discharged home from the ED on Levaquin for RLL PNA. She subsequently returned to [facility] on 04/21/11 with worsening similar symptoms.   At [facility] she was continued on Levofloxacin and started on Tamiflu.  Influenza PCR negative, and urine strep pneumoniae antigen negative. She continued to deteriorate, becoming more tachypneic and tachycardia HR 150s, with decreased o2 saturation. Repeat cxr showed bilateral infiltrates. She was subsequently intubated on 04/21/11 and transferred to [facility].   INTERVAL HISTORY:  Has been tachycardic 1/6 am Febrile to 103 Hemodynamically stable otherwise   VITAL SIGNS: Temp:  [97.9 F (36.6 C)-102.9 F (39.4 C)] 102.9 F (39.4 C) (01/06 0745) Pulse Rate:  [105-158] 152  (01/06 0845) Resp:  [18-38] 23  (01/06 0845) BP: (98-197)/(57-105) 104/62 mmHg (01/06 0830) SpO2:  [41 %-100 %] 99 % (01/06 0845) FiO2 (%):  [50 %-100 %] 60 % (01/06 0821) Weight:  [86.7 kg (191 lb 2.2 oz)] 86.7 kg (191 lb 2.2 oz) (01/05 1137) HEMODYNAMICS: CVP:  [5 mmHg-12 mmHg] 6 mmHg VENTILATOR SETTINGS: Vent Mode:  [-] PRVC FiO2 (%):  [50 %-100 %] 60 % Set Rate:  [14 bmp-16 bmp] 16 bmp Vt Set:  [490 mL-500 mL] 490 mL PEEP:  [10 cmH20] 10 cmH20 Plateau Pressure:  [22 cmH20-27 cmH20] 27 cmH20 INTAKE / OUTPUT: Intake/Output      01/05 0701 - 01/06 0700 01/06 0701 - 01/07 0700   P.O. 890    I.V. (mL/kg) 2230.7 (25.7)    IV Piggyback 2300    Total Intake(mL/kg) 5420.7 (62.5)    Urine (mL/kg/hr) 2550 (1.2)    Total Output 2550    Net +2870.7           PHYSICAL EXAMINATION: General: Intubated on mechanical ventilation and sedated  Neuro: sedated  HEENT: B/l pin point pupils  Cardiovascular: Tachycardia, -M/R/G  Lungs: Coarse b/l breath sounds  Abdomen: +bs, soft, obese  Musculoskeletal: Mild edema b/l lower extremities, +2dp b/l  Skin: Dry, warm  LABS: CBC  Lab 04/21/12 0316 04/20/12 0405  WBC 11.2* --  HGB 10.9* 11.3*  HCT 32.9* 32.4*  PLT 134* 136*   Chemistry  Lab 04/21/12 0316 04/20/12 1426 04/20/12 0405  NA 133* 133* 126*  K 3.7 3.8 3.6  CL 100 97 88*  CO2 23 21 23   BUN 6 8 9   CREATININE 0.69 0.82 0.75  CALCIUM 7.9* 8.7 8.5  MG 1.7 -- --  PHOS -- -- --    GLUCOSE 167* 267* 198*   Liver function  Lab 04/21/12 0316 04/20/12 1426  AST 42* 37  ALT 8 10  ALKPHOS 58 60  BILITOT 0.2* 0.1*  PROT 5.6* 6.4  ALBUMIN 2.4* 2.9*   BNP  Lab 04/20/12 2303  PROBNP 359.2*   ABG  Lab 04/21/12 0342  PHART 7.313*  PCO2ART 46.8*  PO2ART 183.0*  HCO3 22.3  TCO2 23.5    CBG trend  Lab 04/21/12 0454 04/21/12 0001 04/20/12 2054 04/20/12 1621 04/20/12 1142  GLUCAP 156* 143* 97 237* 302*    IMAGING:  CXR (04/19/2012) - Right lower lobe airspace disease, consistent with pneumonia. Post- treatment radiographic followup recommended to confirm resolution.  CXR 04/22/11: b/l airspace opacities appear similar with slightly reduced lung volumes.  B/L pna vs pulmonary edema remain in differentials.    ECG: 04/21/11: 130bpm sinus tachycardia, prolonged qtc  DIAGNOSES: Principal Problem:  *ARDS (adult respiratory distress syndrome) Active Problems:  DIABETES MELLITUS, TYPE II  TOBACCO ABUSE  HYPERTENSION  GASTROESOPHAGEAL REFLUX DISEASE  Diastolic heart failure  CAP (community acquired pneumonia)  Acute respiratory failure with hypoxia  Bipolar 1 disorder  Hyponatremia  ASSESSMENT / PLAN:  PULMONARY Assessment:  1) VDRF - secondary to acute on chronic respiratory failure in setting of CAP. 2) ARDS Plan:   - Mechanical ventilation >> ARDS protocol - VAP prevention order set - follow CXR. - Obtain tracheal aspirate--influenza pcr (the nasal swab was negative)  CARDIOVASCULAR Assessment:  1) Sinus tachycardia - likely secondary to volume depletion in setting of sepsis in addition to her acute respiratory failure, agitation 2) Nonobstructive CAD - per cath in 2007.  3) Hyperlipidemia - goal LDL < 100mg  /dL 4) Hypertension - on home metoprolol.  5) Prolonged QT - likely contributed by her home Geodon and trazadone 6) Grade 2 diastolic dysfunction - per echocardiogram 2011, consider possible contribution to her B infiltrates Plan:   -  MAP at goal; once sepsis protocol expires, favor diuresis to drive CVP down per FA[facility] goals and given potential contribution of diastolic heart failure - EKG am  - 2-D echocardiogram pending - hold home dose metoprolol, restart when BP stable - monitor CVP q4h for now  RENAL Assessment: 1) Pseudohyponatremia - admission Na 133, however, this corrects for her glucose of 267. Plan:   - Continue to monitor renal function  GASTROINTESTINAL Assessment: No acute issues Plan:   - GI prophylaxis with protonix  HEMATOLOGIC Assessment: 1) Chronic normocytic anemia - BL Hgb within normal limits in late 2013, previously 10-11. Hgb stable on admission, no indication of acute blood loss.  Plan:   - Continue to monitor CBC - DVT prophylaxis with Lovenox  INFECTIOUS Assessment: 1) Sepsis secondary to severe CAP - on admission had already been treated with 2 days of levaquin.  Of note urine strep is negative, and urine legionella are pending. Plan:   - Primaxin + Vancomycin started 1/6 am; add azithro to continue atypical coverage; adjust abx based on cx data - Obtain tracheal aspirate for culture and to check for influenza - F/U urine legionella - blood cx x2 - urine cx/ua  ENDOCRINE Assessment: 1) Diabetes mellitus type 2, A1c 8.2 - only on oral meds at home. Plan:   - ICU hyperglycemia protocol  NEUROLOGIC / PSYCHIATRIC Assessment:  1) Bipolar disorder 2) Schizoaffective disorder 3) History of alcohol abuse - family states this is currently in remission. Plan:   - Hold home geodon and trazodone in setting of prolonged QTc - discontinued depakote - consider restart some of the above as she stabilizes - may help with agitation as we prepare for extubation  CLINICAL SUMMARY: [name] is a 47 y.o., female with a PMHX of DM2 (A1c 8.2), HLD, schizoaffective disorder, and alcohol abuse who is admitted to [facility] on 04/21/2012 on transfer from [facility] (admitted on 04/20/2012) with  severe CAP in setting of acute respiratory failure requiring intubation on 04/20/2012.   I have personally obtained a history, examined the patient, evaluated laboratory and imaging results, formulated the assessment and plan and placed orders.  CRITICAL CARE: The patient is critically ill with multiple organ systems failure and requires high complexity decision making for assessment and support, frequent evaluation and titration of therapies, application of advanced monitoring technologies and extensive interpretation of multiple databases. Critical Care Time devoted to patient care services described in this note is 30 minutes.   Levy Pupa, MD, PhD 04/21/2012, 9:14 AM [facility] [phone] or if no answer [phone]

## 2012-04-21 NOTE — Progress Notes (Signed)
Echocardiogram 2D Echocardiogram has been performed.  [name] 04/21/2012, 10:13 AM

## 2012-04-21 NOTE — Progress Notes (Signed)
Per Pola Corn MD... Order received to advance ET tube 1 cm and then get a repeat chest xray. RT notified.  At 0012 RT advanced ET tube 1 cm. Repeat chest xray in progress. Will continue to monitor.

## 2012-04-21 NOTE — Progress Notes (Signed)
[facility] called. Report given. Family updated that [facility] is on the way.

## 2012-04-21 NOTE — Progress Notes (Signed)
ater many calls, lab Alison Stalling) and I discerned that influenza testing of tracheal aspirate must be ordered as Respiratory Viral Profile and sent out to outside lab.

## 2012-04-21 NOTE — Progress Notes (Signed)
Report called to RN on 3100. Pt transferred to [facility] via [employer]

## 2012-04-21 NOTE — Progress Notes (Signed)
ANTIBIOTIC CONSULT NOTE - INITIAL  Pharmacy Consult for Vancocin and Primaxin Indication: rule out pneumonia and rule out sepsis  Allergies  Allergen Reactions  . Metronidazole Shortness Of Breath and Swelling  . Orange Itching  . Shrimp (Shellfish Allergy) Shortness Of Breath and Itching    Takes Benadryl before eating shrimp.  Marland Kitchen Penicillins Hives and Swelling    Fever as well  . Sulfonamide Derivatives Hives    fever  . Glipizide Other (See Comments)    psychosis  . Sulfamethoxazole W-Trimethoprim Rash    Patient Measurements: Height: 5\' 4"  (162.6 cm) Weight: 191 lb 2.2 oz (86.7 kg) IBW/kg (Calculated) : 54.7   Vital Signs: Temp: 100.3 F (37.9 C) (01/06 0300) Temp src: Axillary (01/06 0300) BP: 111/69 mmHg (01/06 0404) Pulse Rate: 138  (01/06 0404)  Labs:  Basename 04/21/12 0316 04/20/12 1426 04/20/12 0405  WBC 11.2* -- 13.0*  HGB 10.9* -- 11.3*  PLT 134* -- 136*  LABCREA -- -- --  CREATININE -- 0.82 0.75   Estimated Creatinine Clearance: 93.3 ml/min (by C-G formula based on Cr of 0.82).   Microbiology: Recent Results (from the past 720 hour(s))  MRSA PCR SCREENING     Status: Normal   Collection Time   04/20/12 12:04 PM      Component Value Range Status Comment   MRSA by PCR NEGATIVE  NEGATIVE Final     Medical History: Past Medical History  Diagnosis Date  . Arteriosclerotic cardiovascular disease (ASCVD)     Minimal at cath in [facility].stress nuclear study in 8/08 with nl EF; neg stress echo in 2010  . Diabetes mellitus, type 2 2000    Onset in 2000; no insulin  . Hyperlipidemia   . Hypertension `    during treatment with Geodon  . Gastroesophageal reflux disease     Schatzki's ring  . Anemia, iron deficiency   . Alcohol abuse   . Depression   . Community acquired pneumonia 01/03/10    2011; with pleural effusion-[facility] acute resp failure  . Obesity   . Community acquired pneumonia 05/2010  . Schizoaffective disorder    requiring multiple psychiatric admissions  . Dysphagia   . Diastolic dysfunction     grade 2 per echo 2011    Medications:  Prescriptions prior to admission  Medication Sig Dispense Refill  . albuterol (PROVENTIL HFA;VENTOLIN HFA) 108 (90 BASE) MCG/ACT inhaler Inhale 2 puffs into the lungs every 4 (four) hours as needed for wheezing.  1 Inhaler  0  . cetirizine (ZYRTEC) 10 MG tablet Take 1 tablet (10 mg total) by mouth at bedtime.  30 tablet  0  . divalproex (DEPAKOTE ER) 500 MG 24 hr tablet Take 1 tablet (500 mg total) by mouth at bedtime.  30 tablet  0  . divalproex (DEPAKOTE) 250 MG DR tablet Take 1 tablet (250 mg total) by mouth every morning.  30 tablet  0  . ferrous gluconate (FERGON) 240 (27 FE) MG tablet Take 1 tablet (240 mg total) by mouth 2 (two) times daily.  60 tablet  0  . gabapentin (NEURONTIN) 300 MG capsule Take 1 capsule (300 mg total) by mouth 3 (three) times daily as needed (anxiety).  90 capsule  0  . hydrochlorothiazide (HYDRODIURIL) 25 MG tablet Take 1 tablet (25 mg total) by mouth daily.  30 tablet  0  . HYDROcodone-homatropine (HYCODAN) 5-1.5 MG/5ML syrup Take 5 mLs by mouth every 6 (six) hours as needed for cough.  120  mL  0  . hydrOXYzine (ATARAX/VISTARIL) 50 MG tablet Take 1 tablet (50 mg total) by mouth 3 (three) times daily as needed for anxiety.  30 tablet  0  . ibuprofen (ADVIL,MOTRIN) 200 MG tablet Take 400 mg by mouth every 8 (eight) hours as needed. Aches and pains, fever      . insulin glargine (LANTUS) 100 UNIT/ML injection Inject 10 Units into the skin at bedtime.  10 mL  0  . levofloxacin (LEVAQUIN) 500 MG tablet Take 1 tablet (500 mg total) by mouth daily.  10 tablet  0  . lovastatin (MEVACOR) 20 MG tablet Take 1 tablet (20 mg total) by mouth at bedtime.  90 tablet  1  . metFORMIN (GLUCOPHAGE) 1000 MG tablet Take 1 tablet (1,000 mg total) by mouth 2 (two) times daily with a meal.  62 tablet  0  . metoprolol (LOPRESSOR) 50 MG tablet Take 1 tablet (50 mg  total) by mouth 2 (two) times daily.  62 tablet  11  . mirtazapine (REMERON) 15 MG tablet Take 1 tablet (15 mg total) by mouth at bedtime.  30 tablet  0  . mometasone (NASONEX) 50 MCG/ACT nasal spray 2 sprays in each nostril daily  17 g  0  . pantoprazole (PROTONIX) 40 MG tablet Take 1 tablet (40 mg total) by mouth 2 (two) times daily.  30 tablet  0  . traZODone (DESYREL) 100 MG tablet Take 100 mg by mouth at bedtime.      . ziprasidone (GEODON) 80 MG capsule Take 1 capsule (80 mg total) by mouth 2 (two) times daily with a meal.  60 capsule  0   Scheduled:    . antiseptic oral rinse  15 mL Mouth Rinse QID  . chlorhexidine  15 mL Mouth Rinse BID  . enoxaparin (LOVENOX) injection  40 mg Subcutaneous Q24H  . etomidate      . imipenem-cilastatin  500 mg Intravenous Once  . imipenem-cilastatin  500 mg Intravenous Q6H  . insulin aspart  1-3 Units Subcutaneous Q4H  . lidocaine (cardiac) 100 mg/56ml      . metoprolol  25 mg Per NG tube BID  . midazolam      . midazolam      . [COMPLETED] midazolam  2-4 mg Intravenous Once  . [COMPLETED] morphine  4 mg Intravenous Once  . nicotine  14 mg Transdermal Daily  . [COMPLETED] ondansetron  4 mg Intravenous Once  . pantoprazole (PROTONIX) IV  40 mg Intravenous Daily  . rocuronium      . sodium chloride  25 mL/kg Intravenous Once  . [COMPLETED] sodium chloride  500 mL Intravenous Once  . sodium chloride  3 mL Intravenous Q12H  . succinylcholine      . vancomycin  1,000 mg Intravenous Once  . vancomycin  1,000 mg Intravenous Q8H  . [DISCONTINUED] benzonatate  100 mg Oral TID  . [DISCONTINUED] benzonatate  200 mg Oral Once  . [DISCONTINUED] divalproex  500 mg Oral QHS  . [DISCONTINUED] divalproex  250 mg Oral Q12H  . [DISCONTINUED] divalproex  250 mg Oral BH-q7a  . [DISCONTINUED] insulin aspart  0-15 Units Subcutaneous TID WC  . [DISCONTINUED] insulin aspart  0-20 Units Subcutaneous TID WC  . [DISCONTINUED] insulin aspart  0-5 Units Subcutaneous  QHS  . [DISCONTINUED] insulin aspart  0-5 Units Subcutaneous QHS  . [DISCONTINUED] insulin glargine  10 Units Subcutaneous QHS  . [DISCONTINUED] insulin glargine  15 Units Subcutaneous QHS  . [DISCONTINUED] ipratropium  0.5 mg Nebulization Q6H  . [DISCONTINUED] levalbuterol  0.63 mg Nebulization Q6H  . [DISCONTINUED] levofloxacin (LEVAQUIN) IV  750 mg Intravenous Q24H  . [DISCONTINUED] metoprolol  50 mg Oral BID  . [DISCONTINUED] metoprolol  50 mg Per NG tube BID  . [DISCONTINUED] oseltamivir  75 mg Oral BID  . [DISCONTINUED] pantoprazole  40 mg Oral BID  . [DISCONTINUED] simvastatin  10 mg Oral q1800  . [DISCONTINUED] sodium chloride  1,000 mL Intravenous Once  . [DISCONTINUED] ziprasidone  80 mg Oral BID WC   Infusions:    . sodium chloride 50 mL/hr at 04/21/12 0100  . sodium chloride    . fentaNYL infusion INTRAVENOUS 115 mcg/hr (04/21/12 0400)  . insulin (NOVOLIN-R) infusion    . propofol 50 mcg/kg/min (04/21/12 0100)  . [DISCONTINUED] albuterol 10 mg/hr (04/20/12 0419)    Assessment: 46yo female admitted to [facility] for CAP, continued severe cough despite med tx, became more SOB and condition deteriorated requiring intubation with transient hypoxia s/p intubation, tx'd to [facility] per family request for "more specialized care"; CXR shows PNA vs pulmonary edema, on ABX at [facility] for CAP, now to change to vanc and imipenem for possible sepsis/PNA.  Goal of Therapy:  Vancomycin trough level 15-20 mcg/ml  Plan:  Will begin vancomycin 1000mg  IV Q8H and Primaxin 500mg  IV Q6H and monitor CBC, Cx, levels prn.  Colleen Can PharmD BCPS 04/21/2012,4:23 AM

## 2012-04-21 NOTE — Telephone Encounter (Signed)
Pt is currently in the hospital in the ICU. Pt needs to discuss refill with D/C MD. She should not cont the 21 mg dose indefinitely - needs to taper down the dose every two weeks.

## 2012-04-21 NOTE — Progress Notes (Signed)
Wasted . of Fentanyl drip on sink and witnessed by Jonelle Sidle, RN.

## 2012-04-22 ENCOUNTER — Inpatient Hospital Stay (HOSPITAL_COMMUNITY): Payer: Medicare Other

## 2012-04-22 DIAGNOSIS — I5030 Unspecified diastolic (congestive) heart failure: Secondary | ICD-10-CM

## 2012-04-22 LAB — CBC
MCH: 28.4 pg (ref 26.0–34.0)
Platelets: 139 10*3/uL — ABNORMAL LOW (ref 150–400)
RBC: 3.56 MIL/uL — ABNORMAL LOW (ref 3.87–5.11)
RDW: 16 % — ABNORMAL HIGH (ref 11.5–15.5)
WBC: 9.2 10*3/uL (ref 4.0–10.5)

## 2012-04-22 LAB — PROTIME-INR: Prothrombin Time: 13.7 seconds (ref 11.6–15.2)

## 2012-04-22 LAB — GLUCOSE, CAPILLARY: Glucose-Capillary: 167 mg/dL — ABNORMAL HIGH (ref 70–99)

## 2012-04-22 LAB — BLOOD GAS, ARTERIAL
Acid-base deficit: 0.9 mmol/L (ref 0.0–2.0)
Acid-base deficit: 1.4 mmol/L (ref 0.0–2.0)
Bicarbonate: 22.1 mEq/L (ref 20.0–24.0)
Bicarbonate: 23.6 mEq/L (ref 20.0–24.0)
Bicarbonate: 24.1 mEq/L — ABNORMAL HIGH (ref 20.0–24.0)
Bicarbonate: 24.7 mEq/L — ABNORMAL HIGH (ref 20.0–24.0)
FIO2: 0.6 %
MECHVT: 350 mL
O2 Saturation: 88.2 %
O2 Saturation: 91.4 %
O2 Saturation: 99.5 %
O2 Saturation: 99.9 %
PEEP: 10 cmH2O
PEEP: 10 cmH2O
PEEP: 10 cmH2O
Patient temperature: 99.3
Patient temperature: 99
RATE: 32 resp/min
TCO2: 24.7 mmol/L (ref 0–100)
TCO2: 25 mmol/L (ref 0–100)
pCO2 arterial: 40 mmHg (ref 35.0–45.0)
pCO2 arterial: 52.2 mmHg — ABNORMAL HIGH (ref 35.0–45.0)
pH, Arterial: 7.297 — ABNORMAL LOW (ref 7.350–7.450)
pH, Arterial: 7.384 (ref 7.350–7.450)
pO2, Arterial: 198 mmHg — ABNORMAL HIGH (ref 80.0–100.0)
pO2, Arterial: 56.5 mmHg — ABNORMAL LOW (ref 80.0–100.0)
pO2, Arterial: 77.7 mmHg — ABNORMAL LOW (ref 80.0–100.0)

## 2012-04-22 LAB — BASIC METABOLIC PANEL
Chloride: 101 mEq/L (ref 96–112)
Creatinine, Ser: 0.58 mg/dL (ref 0.50–1.10)
GFR calc Af Amer: >90 mL/min (ref >90)
Potassium: 4.1 mEq/L (ref 3.5–5.1)

## 2012-04-22 LAB — MAGNESIUM: Magnesium: 1.9 mg/dL (ref 1.5–2.5)

## 2012-04-22 LAB — RESPIRATORY VIRUS PANEL
Influenza A H1: NOT DETECTED
Influenza A H3: NOT DETECTED
Influenza A: NOT DETECTED
Influenza B: NOT DETECTED
Parainfluenza 3: NOT DETECTED
Respiratory Syncytial Virus A: NOT DETECTED

## 2012-04-22 LAB — PRO B NATRIURETIC PEPTIDE: Pro B Natriuretic peptide (BNP): 593.2 pg/mL — ABNORMAL HIGH (ref 0–125)

## 2012-04-22 MED ORDER — ARTIFICIAL TEARS OP OINT
1.0000 "application " | TOPICAL_OINTMENT | Freq: Three times a day (TID) | OPHTHALMIC | Status: DC
  Administered 2012-04-22 – 2012-04-25 (×7): 1 via OPHTHALMIC
  Filled 2012-04-22: qty 3.5

## 2012-04-22 MED ORDER — SODIUM CHLORIDE 0.9 % IV SOLN
INTRAVENOUS | Status: DC
  Administered 2012-04-22 – 2012-04-28 (×3): via INTRAVENOUS

## 2012-04-22 MED ORDER — VECURONIUM BOLUS VIA INFUSION
0.0800 mg/kg | Freq: Once | INTRAVENOUS | Status: DC
  Filled 2012-04-22: qty 8

## 2012-04-22 MED ORDER — VECURONIUM BROMIDE 10 MG IV SOLR
0.8000 ug/kg/min | INTRAVENOUS | Status: DC
  Administered 2012-04-22: 1 ug/kg/min via INTRAVENOUS
  Administered 2012-04-23: 0.8 ug/kg/min via INTRAVENOUS
  Filled 2012-04-22 (×3): qty 100

## 2012-04-22 MED ORDER — VANCOMYCIN HCL 10 G IV SOLR
1250.0000 mg | Freq: Three times a day (TID) | INTRAVENOUS | Status: DC
  Administered 2012-04-23 – 2012-04-24 (×5): 1250 mg via INTRAVENOUS
  Filled 2012-04-22 (×6): qty 1250

## 2012-04-22 NOTE — Procedures (Addendum)
Arterial Catheter Insertion Procedure Note [name] [id number] [birth year]  Arterial Line placed successfully x1 attempt in right radial artery.  Good waveform noted; Initial ABP 146/72.  Site cleaned, dressed and secured per protocol.     Procedure: Insertion of Arterial Catheter  Indications: Blood pressure monitoring  Procedure Details Consent: Risks of procedure as well as the alternatives and risks of each were explained to the (patient/caregiver).  Consent for procedure obtained. Time Out: Verified patient identification, verified procedure, site/side was marked, verified correct patient position, special equipment/implants available, medications/allergies/relevent history reviewed, required imaging and test results available.  Performed  Maximum sterile technique was used including antiseptics, cap, gloves, gown, hand hygiene, mask and chloraprep. Skin prep: Chlorhexidine; local anesthetic administered 20 gauge catheter was inserted into right radial artery using the Seldinger technique.  Evaluation Blood flow good; BP tracing good. Complications: No apparent complications.   [name] 04/22/2012

## 2012-04-22 NOTE — Progress Notes (Signed)
ANTIBIOTIC CONSULT NOTE  Pharmacy Consult for Vancocin and Primaxin Indication: rule out pneumonia and rule out sepsis  Allergies  Allergen Reactions  . Metronidazole Shortness Of Breath and Swelling  . Orange Itching  . Shrimp (Shellfish Allergy) Shortness Of Breath and Itching    Takes Benadryl before eating shrimp.  Marland Kitchen Penicillins Hives and Swelling    Fever as well  . Sulfonamide Derivatives Hives    fever  . Glipizide Other (See Comments)    psychosis  . Sulfamethoxazole W-Trimethoprim Rash    Patient Measurements: Height: 5\' 5"  (165.1 cm) Weight: 199 lb 4.7 oz (90.4 kg) IBW/kg (Calculated) : 57   Vital Signs: Temp: 99.5 F (37.5 C) (01/07 1800) Temp src: Rectal (01/07 1800) BP: 105/62 mmHg (01/07 1900) Pulse Rate: 104  (01/07 1900)  Labs:  Basename 04/22/12 0415 04/21/12 0316 04/20/12 1426 04/20/12 0405  WBC 9.2 11.2* -- 13.0*  HGB 10.1* 10.9* -- 11.3*  PLT 139* 134* -- 136*  LABCREA -- -- -- --  CREATININE 0.58 0.69 0.82 --   Estimated Creatinine Clearance: 99.7 ml/min (by C-G formula based on Cr of 0.58). Vancomycin trough 12.9  Microbiology: Recent Results (from the past 720 hour(s))  MRSA PCR SCREENING     Status: Normal   Collection Time   04/20/12 12:04 PM      Component Value Range Status Comment   MRSA by PCR NEGATIVE  NEGATIVE Final   CULTURE, RESPIRATORY     Status: Normal (Preliminary result)   Collection Time   04/21/12  3:30 AM      Component Value Range Status Comment   Specimen Description TRACHEAL ASPIRATE   Final    Special Requests NONE   Final    Gram Stain     Final    Value: RARE WBC PRESENT, PREDOMINANTLY PMN     NO SQUAMOUS EPITHELIAL CELLS SEEN     RARE GRAM POSITIVE COCCI     IN PAIRS   Culture Non-Pathogenic Oropharyngeal-type Flora Isolated.   Final    Report Status PENDING   Incomplete   CULTURE, BLOOD (ROUTINE X 2)     Status: Normal (Preliminary result)   Collection Time   04/21/12  9:30 AM      Component Value Range  Status Comment   Specimen Description BLOOD RIGHT ARM   Final    Special Requests BOTTLES DRAWN AEROBIC AND ANAEROBIC 10CC   Final    Culture  Setup Time 04/21/2012 14:18   Final    Culture     Final    Value:        BLOOD CULTURE RECEIVED NO GROWTH TO DATE CULTURE WILL BE HELD FOR 5 DAYS BEFORE ISSUING A FINAL NEGATIVE REPORT   Report Status PENDING   Incomplete   CULTURE, BLOOD (ROUTINE X 2)     Status: Normal (Preliminary result)   Collection Time   04/21/12  9:40 AM      Component Value Range Status Comment   Specimen Description BLOOD RIGHT HAND   Final    Special Requests BOTTLES DRAWN AEROBIC AND ANAEROBIC 10CC   Final    Culture  Setup Time 04/21/2012 14:18   Final    Culture     Final    Value:        BLOOD CULTURE RECEIVED NO GROWTH TO DATE CULTURE WILL BE HELD FOR 5 DAYS BEFORE ISSUING A FINAL NEGATIVE REPORT   Report Status PENDING   Incomplete   RESPIRATORY VIRUS PANEL  Status: Abnormal   Collection Time   04/21/12 10:00 AM      Component Value Range Status Comment   Source - RVPAN TRACHEAL ASPIRATE   Final    Respiratory Syncytial Virus A NOT DETECTED   Final    Respiratory Syncytial Virus B NOT DETECTED   Final    Influenza A NOT DETECTED   Final    Influenza B NOT DETECTED   Final    Parainfluenza 1 NOT DETECTED   Final    Parainfluenza 2 NOT DETECTED   Final    Parainfluenza 3 NOT DETECTED   Final    Metapneumovirus DETECTED (*)  Final    Rhinovirus NOT DETECTED   Final    Adenovirus NOT DETECTED   Final    Influenza A H1 NOT DETECTED   Final    Influenza A H3 NOT DETECTED   Final     Medical History: Past Medical History  Diagnosis Date  . Arteriosclerotic cardiovascular disease (ASCVD)     Minimal at cath in [facility].stress nuclear study in 8/08 with nl EF; neg stress echo in 2010  . Diabetes mellitus, type 2 2000    Onset in 2000; no insulin  . Hyperlipidemia   . Hypertension `    during treatment with Geodon  . Gastroesophageal reflux disease      Schatzki's ring  . Anemia, iron deficiency   . Alcohol abuse   . Depression   . Community acquired pneumonia 01/03/10    2011; with pleural effusion-[facility] acute resp failure  . Obesity   . Community acquired pneumonia 05/2010  . Schizoaffective disorder     requiring multiple psychiatric admissions  . Dysphagia   . Diastolic dysfunction     grade 2 per echo 2011    Assessment: 47yo female admitted to [facility] for CAP, continued severe cough despite med tx, became more SOB and condition deteriorated requiring intubation with transient hypoxia s/p intubation, tx'd to [facility] per family request for "more specialized care"; CXR shows PNA vs pulmonary edema, on ABX at [facility] for CAP, now to change to vanc and imipenem for possible sepsis/PNA.  Vancomycin trough is 12.9 mg/L on 1g IV q8h.  This is below the goal of 15-20mg /L  Respiratory virus panel positive for metapneumovirus. 1/6 Resp culture - normal flora, final result pending 1/6 blood culture - no growth   Goal of Therapy:  Vancomycin trough level 15-20 mcg/ml  Plan:  Increase vancomycin to 1250 mg IV q8h.  Continue to monitor cultures and renal function. Continue primaxin 500mg  IV q6h  Mickeal Skinner PharmD BCPS 04/22/2012,9:44 PM

## 2012-04-22 NOTE — Progress Notes (Signed)
Dr. Delford Field said it was OK to keep vecuronium at 1.2 mcg/kg/min because she is not breathing over the vent even though patient still exhibited 4/4 twitches at 30mV.  He said it was OK to titrate up if pt starts breathing over the vent.

## 2012-04-22 NOTE — Progress Notes (Addendum)
RT placed patient on 6cc per ARDS protocol and increased RR to maintain MVE on 1/6 at 23:02

## 2012-04-22 NOTE — Progress Notes (Signed)
RT to decrease Fi02 to 70% per ARDS protocol and per ABG results.

## 2012-04-22 NOTE — Progress Notes (Addendum)
2 kg weight gain noted since admission.

## 2012-04-22 NOTE — Progress Notes (Signed)
PULMONARY  / CRITICAL CARE MEDICINE  Name: [name] MRN: [id number] DOB: Aug 30, 1965    LOS: 2  REFERRING MD :  Osvaldo Shipper  CHIEF COMPLAINT:  Shortness of breath, CAP  BRIEF PATIENT DESCRIPTION: [name] is a 47 y.o., female with a PMHX of DM2 (A1c 8.2), HLD, bipolar disorder, schizoaffective disorder, and alcohol abuse who is admitted to [facility] on 04/22/2012 on transfer from [facility] (admitted on 04/20/2012) with severe CAP in setting of acute respiratory failure requiring intubation on 04/20/2012. Flu negative on nasal swab.  LINES / TUBES: ETT 01/05 >>> PIV left forearm 01/05 >>> PIV left forearm 01/05 >>> OG tube 01/05 >>> L [place] CVC 1/6>>>  CULTURES: Respiratory culture 01/06 >>  Influenza panel 01/05 >> NEGATIVE Urine strep 01/05 >> NEGATIVE Urine legionella 01/05 >>>  Blood cx x2 1/6>>> Urine cx 1/6>>>  ANTIBIOTICS: Levaquin 01/04 >> 01/06 Vancomycin 01/06 >>> Primaxin 01/06 >>> azithro 1/6 >>   SIGNIFICANT EVENTS:  01/05 - Admitted to [facility] with CAP 01/06 - Respiratory distress requiring intubation. Transferred from [facility] to [facility]  01/06 - TTE >> LVEF 60-65%, estimated PASP   LEVEL OF CARE:  ICU PRIMARY SERVICE:  PCCM CONSULTANTS:  None CODE STATUS: FULL DIET:  NPO DVT Px:  Lovenox GI Px:  Protonix  HISTORY OF PRESENT ILLNESS:   [name] is a 47 y.o. African American female with a PMHX of DM2 (A1c 8.2), HLD, bipolar disorder, depression, alcohol abuse, and current tobacco abuse who is admitted to [facility] on 04/21/2012 upon transfer from [facility] (admitted on 04/20/2012) with CAP in setting of acute respiratory failure requiring intubation on 04/20/2012. She initially presented to [facility] ED on 04/20/11 with complaints of shortness of breath, non-productive cough, fevers, and vomiting. She was discharged home from the ED on Levaquin for RLL PNA. She subsequently returned to [facility] on 04/21/11 with worsening similar symptoms.   At [facility] she was  continued on Levofloxacin and started on Tamiflu. Influenza PCR negative, and urine strep pneumoniae antigen negative. She continued to deteriorate, becoming more tachypneic and tachycardia HR 150s, with decreased o2 saturation. Repeat cxr showed bilateral infiltrates. She was subsequently intubated on 04/21/11 and transferred to [facility].   INTERVAL HISTORY:  Tachycardic last 36h, improved to 120's this am Hemodynamically stable otherwise Note > 2L [positive last 24 hours  VITAL SIGNS: Temp:  [100 F (37.8 C)-103 F (39.4 C)] 100.8 F (38.2 C) (01/07 0900) Pulse Rate:  [105-154] 107  (01/07 1000) Resp:  [16-42] 29  (01/07 1000) BP: (94-151)/(59-93) 100/60 mmHg (01/07 1000) SpO2:  [76 %-100 %] 96 % (01/07 1000) FiO2 (%):  [59.8 %-100 %] 69.6 % (01/07 1000) Weight:  [90.4 kg (199 lb 4.7 oz)] 90.4 kg (199 lb 4.7 oz) (01/07 0200) HEMODYNAMICS: CVP:  [5 mmHg-43 mmHg] 12 mmHg VENTILATOR SETTINGS: Vent Mode:  [-] PRVC FiO2 (%):  [59.8 %-100 %] 69.6 % Set Rate:  [16 bmp-32 bmp] 32 bmp Vt Set:  [350 mL-490 mL] 350 mL PEEP:  [10 cmH20-12 cmH20] 10 cmH20 Plateau Pressure:  [20 cmH20-29 cmH20] 20 cmH20 INTAKE / OUTPUT: Intake/Output      01/06 0701 - 01/07 0700 01/07 0701 - 01/08 0700   P.O.     I.V. (mL/kg) 2810 (31.1) 348 (3.8)   Other  50   IV Piggyback 1250    Total Intake(mL/kg) 4060 (44.9) 398 (4.4)   Urine (mL/kg/hr) 1650 (0.8) 275   Total Output 1650 275  Net +2410 +123          PHYSICAL EXAMINATION: General: Intubated on mechanical ventilation and sedated  Neuro: sedated  HEENT: B/l pin point pupils  Cardiovascular: Tachycardia, -M/R/G  Lungs: Coarse b/l breath sounds  Abdomen: +bs, soft, obese  Musculoskeletal: Mild edema b/l lower extremities, +2dp b/l  Skin: Dry, warm  LABS: CBC  Lab 04/22/12 0415 04/21/12 0316 04/20/12 0405  WBC 9.2 -- --  HGB 10.1* 10.9* 11.3*  HCT 30.4* 32.9* 32.4*  PLT 139* 134* 136*   Chemistry  Lab 04/22/12 0415 04/21/12 0316 04/20/12  1426  NA 135 133* 133*  K 4.1 3.7 3.8  CL 101 100 97  CO2 24 23 21   BUN 6 6 8   CREATININE 0.58 0.69 0.82  CALCIUM 7.8* 7.9* 8.7  MG 1.9 1.7 --  PHOS 2.6 -- --  GLUCOSE 160* 167* 267*   Liver function  Lab 04/21/12 0316 04/20/12 1426  AST 42* 37  ALT 8 10  ALKPHOS 58 60  BILITOT 0.2* 0.1*  PROT 5.6* 6.4  ALBUMIN 2.4* 2.9*   BNP  Lab 04/22/12 0415 04/20/12 2303  PROBNP 593.2* 359.2*   ABG  Lab 04/22/12 0342 04/22/12 0050 04/21/12 2200  PHART 7.297* 7.327* 7.388  PCO2ART 52.2* 46.9* 38.4  PO2ART 77.7* 56.5* 87.4  HCO3 24.1* 23.6 22.2  TCO2 25.5 25.0 23.3    CBG trend  Lab 04/22/12 0828 04/22/12 0351 04/21/12 2323 04/21/12 2040 04/21/12 1655  GLUCAP 122* 149* 167* 180* 174*    IMAGING: CXR 1/7 diffuse GGI with alveolar pattern, LL air bronchograms (reviewed by me)   ECG: 04/21/11: 130bpm sinus tachycardia, prolonged qtc  DIAGNOSES: Principal Problem:  *ARDS (adult respiratory distress syndrome) Active Problems:  DIABETES MELLITUS, TYPE II  TOBACCO ABUSE  HYPERTENSION  GASTROESOPHAGEAL REFLUX DISEASE  Diastolic heart failure  CAP (community acquired pneumonia)  Acute respiratory failure with hypoxia  Bipolar 1 disorder  Hyponatremia  ASSESSMENT / PLAN:  PULMONARY Assessment:  1) VDRF - secondary to acute on chronic respiratory failure in setting of CAP, ARDS 2) ARDS 3) LLL CAP Plan:   - Mechanical ventilation >> ARDS protocol, weaning > FiO2 0.70 + PEEP 10 - VAP prevention order set - follow CXR. - Obtain tracheal aspirate viral panel >. Pending - goal begin to push diuretics for CVP 5-7  CARDIOVASCULAR Assessment:  1) Sinus tachycardia - likely secondary to volume depletion + sepsis in addition to her acute respiratory failure, agitation 2) Nonobstructive CAD - per cath in 2007.  3) Hyperlipidemia - goal LDL < 100mg  /dL 4) Hypertension - on home metoprolol.  5) Prolonged QT - likely contributed by her home Geodon and trazadone 6) Grade 2  diastolic dysfunction with secondary PAH by TTE 1/6 - consider possible contribution to her B infiltrates Plan:   - MAP at goal; favor diuresis to drive CVP down per FACTT goals and given potential contribution of diastolic heart failure - metoprolol added 1/6, tolerating - monitor CVP q4h  RENAL Assessment: 1) Pseudohyponatremia - admission Na 133, however, this corrects for her glucose of 267. Plan:   - Continue to monitor renal function  GASTROINTESTINAL Assessment: No acute issues Plan:   - GI prophylaxis with protonix - consider start TF soon, but want to minimize input at this time  HEMATOLOGIC Assessment: 1) Chronic normocytic anemia - BL Hgb within normal limits in late 2013, previously 10-11. Hgb stable on admission, no indication of acute blood loss.  Plan:   - Continue to  monitor CBC - DVT prophylaxis with Lovenox  INFECTIOUS Assessment: 1) Sepsis secondary to severe CAP - on admission had already been treated with 2 days of levaquin. Of note urine strep is negative, and urine legionella are pending. Plan:   - Primaxin + Vancomycin started 1/6 am; added azithro to continue atypical coverage 1/6; adjust abx based on cx data - Obtain tracheal aspirate for culture and to check for influenza - F/U urine legionella, other cx > pending  ENDOCRINE Assessment: 1) Diabetes mellitus type 2, A1c 8.2 - only on oral meds at home. Plan:   - ICU hyperglycemia protocol  NEUROLOGIC / PSYCHIATRIC Assessment:  1) Bipolar disorder 2) Schizoaffective disorder 3) History of alcohol abuse - family states this is currently in remission. Plan:   - Hold home geodon and trazodone in setting of prolonged QTc - discontinued depakote - consider restart some of the above as she stabilizes - may help with agitation when we prepare for extubation  CLINICAL SUMMARY: [name] is a 47 y.o., female with a PMHX of DM2 (A1c 8.2), HLD, schizoaffective disorder, and alcohol abuse who is  admitted to [facility] on 04/21/2012 on transfer from [facility] (admitted on 04/20/2012) with severe CAP in setting of acute respiratory failure requiring intubation on 04/20/2012.   I have personally obtained a history, examined the patient, evaluated laboratory and imaging results, formulated the assessment and plan and placed orders.  CRITICAL CARE: The patient is critically ill with multiple organ systems failure and requires high complexity decision making for assessment and support, frequent evaluation and titration of therapies, application of advanced monitoring technologies and extensive interpretation of multiple databases. Critical Care Time devoted to patient care services described in this note is 30 minutes.   Levy Pupa, MD, PhD 04/22/2012, 10:52 AM  Pulmonary and Critical Care [phone] or if no answer [phone]

## 2012-04-22 NOTE — Progress Notes (Signed)
eLink Physician-Brief Progress Note Patient Name: NADIA VIAR DOB: 07-31-1965 MRN: [id number]  Date of Service  04/22/2012   HPI/Events of Note   ARDS progressing  eICU Interventions  Started NMB and increased peep 14.  May need HFOV   Intervention Category Major Interventions: Respiratory failure - evaluation and management  Shan Levans 04/22/2012, 7:25 PM

## 2012-04-23 ENCOUNTER — Inpatient Hospital Stay (HOSPITAL_COMMUNITY): Payer: Medicare Other

## 2012-04-23 LAB — GLUCOSE, CAPILLARY: Glucose-Capillary: 159 mg/dL — ABNORMAL HIGH (ref 70–99)

## 2012-04-23 LAB — COMPREHENSIVE METABOLIC PANEL
ALT: 10 U/L (ref 0–35)
AST: 27 U/L (ref 0–37)
Alkaline Phosphatase: 94 U/L (ref 39–117)
CO2: 24 mEq/L (ref 19–32)
GFR calc Af Amer: >90 mL/min (ref >90)
GFR calc non Af Amer: >90 mL/min (ref >90)
Glucose, Bld: 132 mg/dL — ABNORMAL HIGH (ref 70–99)
Potassium: 3.9 mEq/L (ref 3.5–5.1)
Sodium: 136 mEq/L (ref 135–145)
Total Protein: 5.6 g/dL — ABNORMAL LOW (ref 6.0–8.3)

## 2012-04-23 LAB — BLOOD GAS, ARTERIAL
Bicarbonate: 21.6 mEq/L (ref 20.0–24.0)
O2 Saturation: 97 %
PEEP: 10 cmH2O
Patient temperature: 98.6
RATE: 35 resp/min

## 2012-04-23 LAB — CBC
HCT: 28.1 % — ABNORMAL LOW (ref 36.0–46.0)
MCH: 28.3 pg (ref 26.0–34.0)
MCV: 84.6 fL (ref 78.0–100.0)
Platelets: 146 10*3/uL — ABNORMAL LOW (ref 150–400)
RBC: 3.32 MIL/uL — ABNORMAL LOW (ref 3.87–5.11)
WBC: 7.1 10*3/uL (ref 4.0–10.5)

## 2012-04-23 LAB — LEGIONELLA ANTIGEN, URINE: Legionella Antigen, Urine: NEGATIVE

## 2012-04-23 LAB — CULTURE, RESPIRATORY W GRAM STAIN

## 2012-04-23 MED ORDER — MIDAZOLAM BOLUS VIA INFUSION
1.0000 mg | INTRAVENOUS | Status: DC | PRN
  Filled 2012-04-23: qty 2

## 2012-04-23 MED ORDER — FUROSEMIDE 10 MG/ML IJ SOLN
40.0000 mg | Freq: Three times a day (TID) | INTRAMUSCULAR | Status: DC
  Administered 2012-04-23 – 2012-04-24 (×4): 40 mg via INTRAVENOUS
  Filled 2012-04-23 (×7): qty 4

## 2012-04-23 MED ORDER — SODIUM CHLORIDE 0.9 % IV SOLN
1.0000 mg/h | INTRAVENOUS | Status: DC
  Administered 2012-04-23: 4 mg/h via INTRAVENOUS
  Administered 2012-04-23 – 2012-04-24 (×3): 8 mg/h via INTRAVENOUS
  Administered 2012-04-24 – 2012-04-26 (×6): 5 mg/h via INTRAVENOUS
  Filled 2012-04-23 (×11): qty 10

## 2012-04-23 NOTE — Progress Notes (Signed)
At 0800, pts BIS was 29. I decreased her fent and versed. By 1610, with BIS still 30, BP elevated to 190s. I resumed prior rate of fent and propofol and gave bolus fent, and sbp immediately decreased to 144/70. BIS reading only 33. Pt is synchronous with vent.

## 2012-04-23 NOTE — Progress Notes (Signed)
PULMONARY  / CRITICAL CARE MEDICINE  Name: [name] MRN: [id number] DOB: 1965/07/13    LOS: 3  REFERRING MD :  Osvaldo Shipper  CHIEF COMPLAINT:  Shortness of breath, CAP  BRIEF PATIENT DESCRIPTION: [name] is a 47 y.o., female with a PMHX of DM2 (A1c 8.2), HLD, bipolar disorder, schizoaffective disorder, and alcohol abuse who is admitted to [facility] on 04/23/2012 on transfer from [facility] (admitted on 04/20/2012) with severe CAP in setting of acute respiratory failure requiring intubation on 04/20/2012. Flu negative on nasal swab.  LINES / TUBES: ETT 01/05 >>> PIV left forearm 01/05 >>> PIV left forearm 01/05 >>> OG tube 01/05 >>> L [place] CVC 1/6>>>  CULTURES: Respiratory culture 01/06 >> Flu negative, positive for metapneumovirus Influenza panel 01/05 >> NEGATIVE Urine strep 01/05 >> NEGATIVE Urine legionella 01/05 >>>  Blood cx x2 1/6>>> Urine cx 1/6>>> ? done  ANTIBIOTICS: Levaquin 01/04 >> 01/06 Vancomycin 01/06 >>> Primaxin 01/06 >>> azithro 1/6 >>   SIGNIFICANT EVENTS:  01/05 - Admitted to [facility] with CAP 01/06 - Respiratory distress requiring intubation. Transferred from [facility] to [facility]  01/06 - TTE >> LVEF 60-65%, estimated PASP   LEVEL OF CARE:  ICU PRIMARY SERVICE:  PCCM CONSULTANTS:  None CODE STATUS: FULL DIET:  NPO DVT Px:  Lovenox GI Px:  Protonix  INTERVAL HISTORY:  Paralyzed last night with some good effect - PEEP was at 14, then able to wean back to 12 CVP has been ~ 10 for last 24h   VITAL SIGNS: Temp:  [97.7 F (36.5 C)-100.8 F (38.2 C)] 98 F (36.7 C) (01/08 0815) Pulse Rate:  [86-125] 115  (01/08 0815) Resp:  [16-39] 35  (01/08 0815) BP: (84-147)/(51-100) 141/91 mmHg (01/08 0815) SpO2:  [94 %-100 %] 100 % (01/08 0815) Arterial Line BP: (88-183)/(48-85) 182/84 mmHg (01/08 0815) FiO2 (%):  [59.8 %-100 %] 70 % (01/08 0815) HEMODYNAMICS: CVP:  [8 mmHg-15 mmHg] 10 mmHg VENTILATOR SETTINGS: Vent Mode:  [-] PRVC FiO2 (%):   [59.8 %-100 %] 70 % Set Rate:  [32 bmp-35 bmp] 35 bmp Vt Set:  [350 mL] 350 mL PEEP:  [10 cmH20-14 cmH20] 12 cmH20 Plateau Pressure:  [18 cmH20-30 cmH20] 28 cmH20 INTAKE / OUTPUT: Intake/Output      01/07 0701 - 01/08 0700 01/08 0701 - 01/09 0700   I.V. (mL/kg) 1309.1 (14.5)    Other 50    IV Piggyback 1200    Total Intake(mL/kg) 2559.1 (28.3)    Urine (mL/kg/hr) 1175 (0.5) 150   Total Output 1175 150   Net +1384.1 -150          PHYSICAL EXAMINATION: General: Intubated on mechanical ventilation and sedated  Neuro: sedated  HEENT: B/l pin point pupils  Cardiovascular: Tachycardia, -M/R/G  Lungs: Coarse b/l breath sounds  Abdomen: +bs, soft, obese  Musculoskeletal: Mild edema b/l lower extremities, +2dp b/l  Skin: Dry, warm  LABS: CBC  Lab 04/23/12 0400 04/22/12 0415 04/21/12 0316  WBC 7.1 -- --  HGB 9.4* 10.1* 10.9*  HCT 28.1* 30.4* 32.9*  PLT 146* 139* 134*   Chemistry  Lab 04/23/12 0400 04/22/12 0415 04/21/12 0316  NA 136 135 133*  K 3.9 4.1 3.7  CL 102 101 100  CO2 24 24 23   BUN 6 6 6   CREATININE 0.59 0.58 0.69  CALCIUM 8.0* 7.8* 7.9*  MG 2.0 1.9 1.7  PHOS 2.3 2.6 --  GLUCOSE 132* 160* 167*   Liver function  Lab 04/23/12  0400 04/21/12 0316 04/20/12 1426  AST 27 42* 37  ALT 10 8 10   ALKPHOS 94 58 60  BILITOT 0.3 0.2* 0.1*  PROT 5.6* 5.6* 6.4  ALBUMIN 2.1* 2.4* 2.9*   BNP  Lab 04/23/12 0400 04/22/12 0415 04/20/12 2303  PROBNP 177.1* 593.2* 359.2*   ABG  Lab 04/22/12 2126 04/22/12 1711 04/22/12 0342  PHART 7.384 7.280* 7.297*  PCO2ART 40.3 54.5* 52.2*  PO2ART 374.0* 65.1* 77.7*  HCO3 23.4 24.7* 24.1*  TCO2 24.7 26.3 25.5    CBG trend  Lab 04/22/12 2350 04/22/12 2048 04/22/12 1639 04/22/12 1219 04/22/12 0828  GLUCAP 121* 135* 144* 170* 122*    IMAGING: CXR 1/8 diffuse B GGI with alveolar pattern, LL air bronchograms, slightly better aeration compared with 1/7 (reviewed by me)   ECG: 04/21/11: 130bpm sinus tachycardia, prolonged  qtc  DIAGNOSES: Principal Problem:  *ARDS (adult respiratory distress syndrome) Active Problems:  DIABETES MELLITUS, TYPE II  TOBACCO ABUSE  HYPERTENSION  GASTROESOPHAGEAL REFLUX DISEASE  Diastolic heart failure  CAP (community acquired pneumonia)  Acute respiratory failure with hypoxia  Bipolar 1 disorder  Hyponatremia  ASSESSMENT / PLAN:  PULMONARY Assessment:  1) VDRF - secondary to acute on chronic respiratory failure in setting of CAP, ARDS 2) ARDS 3) LLL CAP 4) resp panel positive for metapneumovirus Plan:   - will continue paralytics for at least 48h, longer if clinically indicated for gas exchange - Mechanical ventilation >> ARDS protocol, weaning > FiO2 0.70 + PEEP 12 - strongly consider HFOV if unable to get FiO2 below 0.60 today 1/8 - VAP prevention order set - follow CXR. - goal begin to push diuretics for CVP 5-7 >> start lasix 40 q8h on 1/8  CARDIOVASCULAR Assessment:  1) Sinus tachycardia - likely secondary to volume depletion + sepsis in addition to her acute respiratory failure, agitation 2) Nonobstructive CAD - per cath in 2007.  3) Hyperlipidemia - goal LDL < 100mg  /dL 4) Hypertension - on home metoprolol.  5) Prolonged QT - likely contributed by her home Geodon and trazadone 6) Grade 2 diastolic dysfunction with secondary PAH by TTE 1/6 - consider possible contribution to her B infiltrates Plan:   - MAP at goal; diuresis as above to drive CVP down per FACTT goals and given potential contribution of diastolic heart failure - metoprolol added 1/6, tolerating - monitor CVP q4h  RENAL Assessment: Electrolyte abnormalities Plan:   - Continue to monitor renal function and lytes  GASTROINTESTINAL Assessment: No acute issues Plan:   - GI prophylaxis with protonix - will start TF soon, but want to minimize input at this time  HEMATOLOGIC Assessment: 1) Chronic normocytic anemia - BL Hgb within normal limits in late 2013, previously 10-11. Hgb stable  on admission, no indication of acute blood loss.  Plan:   - Continue to monitor CBC - DVT prophylaxis with Lovenox  INFECTIOUS Assessment: 1) Sepsis secondary to severe CAP - on admission had already been treated with 2 days of levaquin. Urine strep is negative, and urine legionella pending. Plan:   - Primaxin + Vancomycin started 1/6 am; added azithro to continue atypical coverage 1/6; cx = normal flora - F/U urine legionella, other cx > pending  ENDOCRINE Assessment: 1) Diabetes mellitus type 2, A1c 8.2 - only on oral meds at home. Plan:   - ICU hyperglycemia protocol  NEUROLOGIC / PSYCHIATRIC Assessment:  1) Bipolar disorder 2) Schizoaffective disorder 3) History of alcohol abuse - family states this is currently in remission. 4)  Sedation Plan:   - currently propofol + fentanyl + vecuronium; will change propofol to versed 1/8 since she will be ventilated for several more days - Hold home geodon and trazodone in setting of prolonged QTc - discontinued depakote - consider restart some of the above as she stabilizes - may help with agitation when we prepare for extubation  CLINICAL SUMMARY: Ms. CATHERINA PATES is a 47 y.o., female with a PMHX of DM2 (A1c 8.2), HLD, schizoaffective disorder, and alcohol abuse who is admitted to [facility] on 04/21/2012 on transfer from [facility] (admitted on 04/20/2012) with severe CAP in setting of acute respiratory failure requiring intubation on 04/20/2012.   I have personally obtained a history, examined the patient, evaluated laboratory and imaging results, formulated the assessment and plan and placed orders.  CRITICAL CARE: The patient is critically ill with multiple organ systems failure and requires high complexity decision making for assessment and support, frequent evaluation and titration of therapies, application of advanced monitoring technologies and extensive interpretation of multiple databases. Critical Care Time devoted to patient care  services described in this note is 45 minutes.   Levy Pupa, MD, PhD 04/23/2012, 8:49 AM [facility] [phone] or if no answer [phone]

## 2012-04-23 NOTE — Progress Notes (Addendum)
INITIAL NUTRITION ASSESSMENT  DOCUMENTATION CODES Per approved criteria  -Obesity Unspecified   INTERVENTION: Recommend Initiate Oxepa @ 10 ml/hr via OGT and increase by 10 ml every 4 hours to goal rate of 20 ml/hr. 60 ml Prostat TID.  At goal rate, tube feeding regimen will provide 1320 kcal (23 kcal/kg of ideal body weight), 120 grams of protein (> 100% of needs), and 376 ml of H2O.   MVI daily   NUTRITION DIAGNOSIS: Inadequate oral intake related to inability to eat as evidenced by NPO status.  Goal: Enteral nutrition to provide 60-70% of estimated calorie needs (22-25 kcals/kg ideal body weight) and >/= 90% of estimated protein needs, based on ASPEN guidelines for permissive underfeeding in critically ill obese individuals.   Monitor:  Vent status, TF initiation and tolerance, weight  Reason for Assessment: VDRF  47 y.o. female  Admitting Dx: ARDS (adult respiratory distress syndrome)  ASSESSMENT: Patient is currently intubated on ventilator support due to severe CAP in setting of acute respiratory failure. Pt on ARDS protocol. Pt discussed during ICU rounds and with RN. Spoke with MD about initiation of enteral nutrition support. OK with trickle feedings with post pyloric tube if can be placed. RN to contact radiology about getting tube placed at bedside as pt too unstable to move at this time per MD.  Spoke with RN who feels pt is too unstable to have post-pyloric tube placed at this time. Will readdress TF tomorrow.    MV: 12.5 Temp:Temp (24hrs), Avg:98.8 F (37.1 C), Min:97.7 F (36.5 C), Max:99.5 F (37.5 C)   Height: Ht Readings from Last 1 Encounters:  04/21/12 5\' 5"  (1.651 m)    Weight: Wt Readings from Last 1 Encounters:  04/22/12 199 lb 4.7 oz (90.4 kg)    Ideal Body Weight: 56.8 kg  % Ideal Body Weight: 159%  Wt Readings from Last 10 Encounters:  04/22/12 199 lb 4.7 oz (90.4 kg)  04/19/12 186 lb (84.369 kg)  03/31/12 186 lb (84.369 kg)  03/23/12  184 lb (83.462 kg)  03/18/12 190 lb (86.183 kg)  03/09/12 196 lb (88.905 kg)  02/07/12 195 lb 3.2 oz (88.542 kg)  01/04/12 187 lb 8 oz (85.049 kg)  01/03/12 186 lb (84.369 kg)  12/25/11 198 lb (89.812 kg)    Usual Body Weight: as above  % Usual Body Weight: > 100%  BMI:  Body mass index is 33.16 kg/(m^2). Obesity Class I  Estimated Nutritional Needs: Kcal: 1741 Protein: > 113 grams Fluid: >2 L/day (normal needs)  Skin: no issues noted  Diet Order: NPO  EDUCATION NEEDS: -No education needs identified at this time   Intake/Output Summary (Last 24 hours) at 04/23/12 1058 Last data filed at 04/23/12 0900  Gross per 24 hour  Intake 1911.09 ml  Output   1075 ml  Net 836.09 ml    Last BM: 04/20/11   Labs:   Lab 04/23/12 0400 04/22/12 0415 04/21/12 0316  NA 136 135 133*  K 3.9 4.1 3.7  CL 102 101 100  CO2 24 24 23   BUN 6 6 6   CREATININE 0.59 0.58 0.69  CALCIUM 8.0* 7.8* 7.9*  MG 2.0 1.9 1.7  PHOS 2.3 2.6 --  GLUCOSE 132* 160* 167*    CBG (last 3)   Basename 04/23/12 0742 04/23/12 0420 04/22/12 2350  GLUCAP 159* 133* 121*    Scheduled Meds:   . antiseptic oral rinse  15 mL Mouth Rinse QID  . artificial tears  1 application Both Eyes  Q8H  . azithromycin  500 mg Intravenous Q24H  . chlorhexidine  15 mL Mouth Rinse BID  . enoxaparin (LOVENOX) injection  40 mg Subcutaneous Q24H  . furosemide  40 mg Intravenous Q8H  . imipenem-cilastatin  500 mg Intravenous Q6H  . insulin aspart  0-9 Units Subcutaneous Q4H  . metoprolol tartrate  25 mg Oral BID  . nicotine  14 mg Transdermal Daily  . pantoprazole (PROTONIX) IV  40 mg Intravenous Daily  . sodium chloride  3 mL Intravenous Q12H  . vancomycin  1,250 mg Intravenous Q8H  . vecuronium  0.08 mg/kg Intravenous Once    Continuous Infusions:   . sodium chloride 20 mL/hr at 04/23/12 0700  . fentaNYL infusion INTRAVENOUS 200 mcg/hr (04/23/12 1030)  . midazolam (VERSED) infusion 6 mg/hr (04/23/12 1030)  .  propofol Stopped (04/23/12 1000)  . vecuronium (NORCURON) infusion 0.8 mcg/kg/min (04/23/12 0900)    Past Medical History  Diagnosis Date  . Arteriosclerotic cardiovascular disease (ASCVD)     Minimal at cath in [facility].stress nuclear study in 8/08 with nl EF; neg stress echo in 2010  . Diabetes mellitus, type 2 2000    Onset in 2000; no insulin  . Hyperlipidemia   . Hypertension `    during treatment with Geodon  . Gastroesophageal reflux disease     Schatzki's ring  . Anemia, iron deficiency   . Alcohol abuse   . Depression   . Community acquired pneumonia 01/03/10    2011; with pleural [facility]usion-[facility] acute resp failure  . Obesity   . Community acquired pneumonia 05/2010  . Schizoaffective disorder     requiring multiple psychiatric admissions  . Dysphagia   . Diastolic dysfunction     grade 2 per echo 2011    Past Surgical History  Procedure Date  . Dilation and curettage, diagnostic / therapeutic 1992  . Esophagogastroduodenoscopy 09/16/08    Dr. Ronni Rumble hiatal hernia/excoriations involving the cardia and mucosa consistent with trauma, antral erosions  of linear petechiae ? gastritis versus early gastric antral vascular  ectasia.Marland Kitchen biopsy showed reactive gastropathy. No H. pylori.  . Esophagogastroduodenoscopy 09/2007    Dr. Rinaldo Ratel ring, dilated to 56 French Maloney dilator, small hiatal hernia, antral erosions, biopsies reactive gastropathy.  Gaspar Bidding dilation 07/17/2011    [facility] sedation-->distal esophageal stricture s/p dilation, chronic gastritis, multiple ulcers in stomach. no h.pylori  . Colonoscopy 01/2006    internal hemorrhoids  . Colonoscopy 01/10/2012    Procedure: COLONOSCOPY;  Surgeon: Corbin Ade, MD;  Location: [facility];  Service: Endoscopy;  Laterality: N/A;  entered cecum @ [phone] ; total cecal withdrawal time = 8 minutes    Kendell Bane RD, LDN, CNSC [phone] Pager [phone] After Hours Pager

## 2012-04-23 NOTE — Progress Notes (Signed)
UR completed 

## 2012-04-24 ENCOUNTER — Inpatient Hospital Stay (HOSPITAL_COMMUNITY): Payer: Medicare Other

## 2012-04-24 LAB — CBC
HCT: 26.1 % — ABNORMAL LOW (ref 36.0–46.0)
Hemoglobin: 8.7 g/dL — ABNORMAL LOW (ref 12.0–15.0)
MCHC: 33.3 g/dL (ref 30.0–36.0)
WBC: 6.4 10*3/uL (ref 4.0–10.5)

## 2012-04-24 LAB — GLUCOSE, CAPILLARY
Glucose-Capillary: 135 mg/dL — ABNORMAL HIGH (ref 70–99)
Glucose-Capillary: 139 mg/dL — ABNORMAL HIGH (ref 70–99)
Glucose-Capillary: 152 mg/dL — ABNORMAL HIGH (ref 70–99)

## 2012-04-24 LAB — PRO B NATRIURETIC PEPTIDE: Pro B Natriuretic peptide (BNP): 125 pg/mL (ref 0–125)

## 2012-04-24 LAB — BASIC METABOLIC PANEL
BUN: 8 mg/dL (ref 6–23)
Chloride: 104 mEq/L (ref 96–112)
GFR calc Af Amer: >90 mL/min (ref >90)
Potassium: 2.9 mEq/L — ABNORMAL LOW (ref 3.5–5.1)

## 2012-04-24 MED ORDER — POTASSIUM CHLORIDE 20 MEQ/15ML (10%) PO LIQD
40.0000 meq | Freq: Two times a day (BID) | ORAL | Status: AC
  Administered 2012-04-24: 40 meq
  Filled 2012-04-24 (×2): qty 30

## 2012-04-24 MED ORDER — PANTOPRAZOLE SODIUM 40 MG PO PACK
40.0000 mg | PACK | Freq: Every day | ORAL | Status: DC
  Administered 2012-04-25 – 2012-04-27 (×3): 40 mg
  Filled 2012-04-24 (×5): qty 20

## 2012-04-24 MED ORDER — POTASSIUM CHLORIDE 20 MEQ/15ML (10%) PO LIQD
40.0000 meq | ORAL | Status: AC
  Administered 2012-04-24 (×2): 40 meq
  Filled 2012-04-24: qty 15
  Filled 2012-04-24: qty 30
  Filled 2012-04-24: qty 15

## 2012-04-24 MED ORDER — OSMOLITE 1.2 CAL PO LIQD
1000.0000 mL | ORAL | Status: DC
  Administered 2012-04-24 – 2012-04-25 (×2): 1000 mL
  Filled 2012-04-24 (×5): qty 1000

## 2012-04-24 MED ORDER — FREE WATER
100.0000 mL | Freq: Three times a day (TID) | Status: DC
  Administered 2012-04-24 – 2012-04-25 (×3): 100 mL

## 2012-04-24 MED ORDER — ACETAMINOPHEN 160 MG/5ML PO SOLN
650.0000 mg | Freq: Four times a day (QID) | ORAL | Status: DC | PRN
  Administered 2012-04-26: 650 mg
  Filled 2012-04-24: qty 20.3

## 2012-04-24 MED ORDER — LEVOFLOXACIN 25 MG/ML PO SOLN
750.0000 mg | Freq: Every day | ORAL | Status: DC
  Administered 2012-04-24 – 2012-04-26 (×3): 750 mg
  Filled 2012-04-24 (×3): qty 30

## 2012-04-24 MED ORDER — HALOPERIDOL LACTATE 5 MG/ML IJ SOLN
1.0000 mg | INTRAMUSCULAR | Status: DC | PRN
  Administered 2012-04-28: 4 mg via INTRAVENOUS
  Filled 2012-04-24: qty 1

## 2012-04-24 NOTE — Progress Notes (Signed)
eLink Physician-Brief Progress Note Patient Name: [name] DOB: 1965-10-06 MRN: [id number]  Date of Service  04/24/2012   HPI/Events of Note  Hypokalemia   eICU Interventions  Potassium replaced   Intervention Category Intermediate Interventions: Electrolyte abnormality - evaluation and management  Zeev Deakins 04/24/2012, 6:56 AM

## 2012-04-24 NOTE — Progress Notes (Signed)
PULMONARY  / CRITICAL CARE MEDICINE  Name: [name] MRN: [id number] DOB: May 02, 1965    LOS: 4  REFERRING MD :  Osvaldo Shipper  CHIEF COMPLAINT:  Shortness of breath, CAP  BRIEF PATIENT DESCRIPTION: [name] is a 47 y.o., female with a PMHX of DM2 (A1c 8.2), HLD, bipolar disorder, schizoaffective disorder, and alcohol abuse who is admitted to [facility] on 04/24/2012 on transfer from [facility] (admitted on 04/20/2012) with severe CAP in setting of acute respiratory failure requiring intubation on 04/20/2012. Flu negative on nasal swab.  LINES / TUBES: ETT 01/05 >>> L [place] CVC 1/6>>> A-line 1/07 >>   CULTURES: Respiratory culture 01/06 >> Flu negative, positive for metapneumovirus Influenza panel 01/05 >> NEGATIVE Urine strep 01/05 >> NEGATIVE Urine legionella 01/05 >>>  Blood cx x2 1/6>>>   ANTIBIOTICS: Levaquin 01/04 >> 01/06 Vancomycin 01/06 >> 1/09 Primaxin 01/06 >> 1/09 azithro 1/6 >> 1/09 Levoflox 1/09 >>    SIGNIFICANT EVENTS:  01/05 - Admitted to [facility] with CAP 01/06 - Respiratory distress requiring intubation. Transferred from [facility] to [facility]  01/06 - TTE >> LVEF 60-65%, estimated PASP   LEVEL OF CARE:  ICU PRIMARY SERVICE:  PCCM CONSULTANTS:  None CODE STATUS: FULL DIET:  TFs 1/09 DVT Px:  Lovenox GI Px:  Protonix  INTERVAL HISTORY:  Sedated, paralyzed   VITAL SIGNS: Temp:  [98 F (36.7 C)-99.2 F (37.3 C)] 99.2 F (37.3 C) (01/09 1600) Pulse Rate:  [96-119] 118  (01/09 2100) Resp:  [20-35] 22  (01/09 2100) BP: (102-128)/(51-95) 120/67 mmHg (01/09 2100) SpO2:  [94 %-100 %] 94 % (01/09 2100) Arterial Line BP: (100-157)/(48-80) 111/55 mmHg (01/09 2100) FiO2 (%):  [39.8 %-80 %] 50.1 % (01/09 2100) HEMODYNAMICS: CVP:  [8 mmHg-9 mmHg] 9 mmHg VENTILATOR SETTINGS: Vent Mode:  [-] PRVC FiO2 (%):  [39.8 %-80 %] 50.1 % Set Rate:  [20 bmp-35 bmp] 20 bmp Vt Set:  [350 mL-470 mL] 470 mL PEEP:  [5 cmH20-8 cmH20] 5 cmH20 Plateau Pressure:  [19  cmH20-23 cmH20] 21 cmH20 INTAKE / OUTPUT: Intake/Output      01/09 0701 - 01/10 0700   I.V. (mL/kg) 750.4 (8.3)   IV Piggyback    Total Intake(mL/kg) 750.4 (8.3)   Urine (mL/kg/hr) 1900 (1.4)   Total Output 1900   Net -1149.6         PHYSICAL EXAMINATION: General: Intubated on mechanical ventilation and sedated, paralyzed Neuro: sedated  HEENT: B/l pin point pupils  Cardiovascular: Tachycardia, -M/R/G  Lungs: Coarse b/l breath sounds  Abdomen: +bs, soft, obese  Musculoskeletal: Mild edema b/l lower extremities, +2dp b/l  Skin: Dry, warm  LABS: CBC  Lab 04/24/12 0530 04/23/12 0400 04/22/12 0415  WBC 6.4 -- --  HGB 8.7* 9.4* 10.1*  HCT 26.1* 28.1* 30.4*  PLT 169 146* 139*   Chemistry  Lab 04/24/12 0530 04/23/12 0400 04/22/12 0415 04/21/12 0316  NA 140 136 135 --  K 2.9* 3.9 4.1 --  CL 104 102 101 --  CO2 24 24 24  --  BUN 8 6 6  --  CREATININE 0.59 0.59 0.58 --  CALCIUM 7.8* 8.0* 7.8* --  MG -- 2.0 1.9 1.7  PHOS -- 2.3 2.6 --  GLUCOSE 142* 132* 160* --   Liver function  Lab 04/23/12 0400 04/21/12 0316 04/20/12 1426  AST 27 42* 37  ALT 10 8 10   ALKPHOS 94 58 60  BILITOT 0.3 0.2* 0.1*  PROT 5.6* 5.6* 6.4  ALBUMIN 2.1* 2.4* 2.9*  BNP  Lab 04/24/12 0530 04/23/12 0400 04/22/12 0415  PROBNP 125.0 177.1* 593.2*   ABG  Lab 04/23/12 0958 04/22/12 2126 04/22/12 1711  PHART 7.385 7.384 7.280*  PCO2ART 36.8 40.3 54.5*  PO2ART 88.0 374.0* 65.1*  HCO3 21.6 23.4 24.7*  TCO2 22.7 24.7 26.3    CBG trend  Lab 04/24/12 2043 04/24/12 1249 04/24/12 0751 04/24/12 0401 04/24/12 0010  GLUCAP 197* 210* 135* 152* 139*    IMAGING: CXR: Improved B AS dz  ECG:   DIAGNOSES:  *ARDS (adult respiratory distress syndrome)  Appears to be due to metapneumovirus  DIABETES MELLITUS, TYPE II  TOBACCO ABUSE  HYPERTENSION  GASTROESOPHAGEAL REFLUX DISEASE  Diastolic heart failure  CAP (community acquired pneumonia) - likely viral  Bipolar 1 disorder  Hyponatremia,  resolved  Hypokalemia  ASSESSMENT / PLAN:  PULMONARY Assessment:  1) VDRF - secondary to acute on chronic respiratory failure in setting of CAP, ARDS 2) ARDS - improved 3) LLL CAP 4) resp panel positive for metapneumovirus Plan:   D/C vecuronium Liberalize Vt - vent changes made  CARDIOVASCULAR Assessment:  1) Sinus tachycardia - likely secondary to volume depletion + sepsis in addition to her acute respiratory failure, agitation 2) Nonobstructive CAD - per cath in 2007.  3) Hyperlipidemia - goal LDL < 100mg  /dL 4) Hypertension - on home metoprolol.  5) Prolonged QT - likely contributed by her home Geodon and trazadone 6) Grade 2 diastolic dysfunction with secondary PAH by TTE 1/6 - consider possible contribution to her B infiltrates Plan:   - D/C further diuresis - metoprolol added 1/6, tolerating - monitor CVP q4h   RENAL Assessment: Electrolyte abnormalities Plan:   - Continue to monitor renal function and lytes  GASTROINTESTINAL Assessment: No acute issues Plan:   - GI prophylaxis with protonix - Begin TFs 1/09  HEMATOLOGIC Assessment: 1) Chronic normocytic anemia - BL Hgb within normal limits in late 2013, previously 10-11. Hgb stable on admission, no indication of acute blood loss.  Plan:   - Continue to monitor CBC - DVT prophylaxis with Lovenox  INFECTIOUS Assessment: 1) Sepsis secondary to severe CAP - on admission had already been treated with 2 days of levaquin. Urine strep is negative, and urine legionella pending. Plan:   - Abx as above   ENDOCRINE Assessment: 1) Diabetes mellitus type 2, A1c 8.2 - only on oral meds at home. Plan:   -Cont ICU hyperglycemia protocol   NEUROLOGIC / PSYCHIATRIC Assessment:  1) Bipolar disorder 2) Schizoaffective disorder 3) History of alcohol abuse - family states this is currently in remission. 4) Sedation Plan:   - Lighten sedation. RASS goal -2. - PRN Haldol   CLINICAL SUMMARY: [name] is  a 47 y.o., female with a PMHX of DM2 (A1c 8.2), HLD, schizoaffective disorder, and alcohol abuse who is admitted to [facility] on 04/21/2012 on transfer from [facility] (admitted on 04/20/2012) with severe CAP in setting of acute respiratory failure requiring intubation on 04/20/2012.    CRITICAL CARE: The patient is critically ill with multiple organ systems failure and requires high complexity decision making for assessment and support, frequent evaluation and titration of therapies, application of advanced monitoring technologies and extensive interpretation of multiple databases. Critical Care Time devoted to patient care services described in this note is 35 minutes.    Billy Fischer, MD ; [facility] [phone].  After 5:30 PM or weekends, call [phone]

## 2012-04-24 NOTE — Progress Notes (Addendum)
Brief Nutrition Note:  Pt without nutrition x 4 days now started on Osmolite 1.2 @ 20 ml/hr, providing: 576 kcal, 26 grams protein  Recommend: Oxepa @ 20 ml/hr with 60 ml Prostat TID  Would provide: 1320 kcal, 120 grams protein.   Discussed with RN who reports that MD aware of recommendations.  Kendell Bane RD, LDN, CNSC [phone] Pager [phone] After Hours Pager

## 2012-04-24 NOTE — Progress Notes (Addendum)
Vecuronium d/cd at 1130. Pt beginning to move at 1200. Fent and versed weaned down, goal Rass -2. Safety mittens placed in anticipation of patient waking up, orders noted for enteral feeding, vent changes per order, rate to 20, peep 5, 40%. Continue to monitor closely

## 2012-04-25 ENCOUNTER — Inpatient Hospital Stay (HOSPITAL_COMMUNITY): Payer: Medicare Other

## 2012-04-25 DIAGNOSIS — J123 Human metapneumovirus pneumonia: Secondary | ICD-10-CM | POA: Diagnosis present

## 2012-04-25 LAB — GLUCOSE, CAPILLARY
Glucose-Capillary: 193 mg/dL — ABNORMAL HIGH (ref 70–99)
Glucose-Capillary: 200 mg/dL — ABNORMAL HIGH (ref 70–99)
Glucose-Capillary: 294 mg/dL — ABNORMAL HIGH (ref 70–99)
Glucose-Capillary: 231 mg/dL — ABNORMAL HIGH (ref 70–99)
Glucose-Capillary: 218 mg/dL — ABNORMAL HIGH (ref 70–99)

## 2012-04-25 LAB — BASIC METABOLIC PANEL
BUN: 11 mg/dL (ref 6–23)
Calcium: 8.7 mg/dL (ref 8.4–10.5)
Creatinine, Ser: 0.52 mg/dL (ref 0.50–1.10)
GFR calc Af Amer: >90 mL/min (ref >90)
GFR calc non Af Amer: >90 mL/min (ref >90)
Glucose, Bld: 217 mg/dL — ABNORMAL HIGH (ref 70–99)

## 2012-04-25 MED ORDER — VALPROIC ACID 250 MG/5ML PO SYRP
250.0000 mg | ORAL_SOLUTION | Freq: Three times a day (TID) | ORAL | Status: DC
  Administered 2012-04-25 – 2012-04-27 (×6): 250 mg
  Filled 2012-04-25 (×9): qty 5

## 2012-04-25 MED ORDER — GABAPENTIN 250 MG/5ML PO SOLN
300.0000 mg | Freq: Two times a day (BID) | ORAL | Status: DC
  Administered 2012-04-25 – 2012-04-26 (×3): 300 mg via ORAL
  Filled 2012-04-25 (×5): qty 6

## 2012-04-25 MED ORDER — ZIPRASIDONE HCL 80 MG PO CAPS
80.0000 mg | ORAL_CAPSULE | Freq: Two times a day (BID) | ORAL | Status: DC
  Administered 2012-04-25 – 2012-04-27 (×4): 80 mg via ORAL
  Filled 2012-04-25 (×7): qty 1

## 2012-04-25 MED ORDER — INSULIN GLARGINE 100 UNIT/ML ~~LOC~~ SOLN
10.0000 [IU] | Freq: Every day | SUBCUTANEOUS | Status: DC
  Administered 2012-04-25 – 2012-04-27 (×3): 10 [IU] via SUBCUTANEOUS

## 2012-04-25 MED ORDER — FREE WATER
200.0000 mL | Freq: Three times a day (TID) | Status: DC
  Administered 2012-04-25 – 2012-04-27 (×5): 200 mL

## 2012-04-25 NOTE — Progress Notes (Signed)
Received call regarding pt's sats dropping into the mid to upper 80's. RT found ET tube had been worked out to around 18cm at the lip. RT advanced ET tube back to 23cm at the lip where it was originally secured. Pt continues to cough, and appear agitated. RN notified. RT will continue to monitor.

## 2012-04-25 NOTE — Progress Notes (Signed)
Inpatient Diabetes Program Recommendations  AACE/ADA: New Consensus Statement on Inpatient Glycemic Control (2013)  Target Ranges:  Prepandial:   less than 140 mg/dL      Peak postprandial:   less than 180 mg/dL (1-2 hours)      Critically ill patients:  140 - 180 mg/dL   Reason for Visit:  Hyperglycemia ICU hyperglycemia protocol would be very appropriate at this time to cover tube feeds and estimate insulin needs.  Note: Protocol is extremely safe in this situation. Otherwise, will need to increase correction q 4 hrs. Thank you, Lenor Coffin, RN, CNS, Diabetes Coordinator [phone]

## 2012-04-25 NOTE — Progress Notes (Signed)
A line not functional  discontinue

## 2012-04-25 NOTE — Progress Notes (Signed)
PULMONARY  / CRITICAL CARE MEDICINE  Name: [name] MRN: [id number] DOB: 11-Sep-1965    LOS: 5  REFERRING MD :  Osvaldo Shipper  CHIEF COMPLAINT:  Shortness of breath, CAP  BRIEF PATIENT DESCRIPTION: [name] is a 47 y.o., female with a PMHX of DM2 (A1c 8.2), HLD, bipolar disorder, schizoaffective disorder, and alcohol abuse who is admitted to [facility] on 04/25/2012 on transfer from [facility] (admitted on 04/20/2012) with severe CAP in setting of acute respiratory failure requiring intubation on 04/20/2012. Flu negative on nasal swab.  LINES / TUBES: ETT 01/05 >>> L [place] CVC 1/6>>> A-line 1/07 >> out  CULTURES: Respiratory culture 01/06 >> Flu negative, positive for metapneumovirus Influenza panel 01/05 >> NEGATIVE Urine strep 01/05 >> NEGATIVE Urine legionella 01/05 >>> NEGATIVE Blood cx x2 1/6>>> NEGATIVE   ANTIBIOTICS: Levaquin 01/04 >> 01/06 Vancomycin 01/06 >> 1/09 Primaxin 01/06 >> 1/09 azithro 1/6 >> 1/09 Levoflox 1/09 >>    SIGNIFICANT EVENTS:  01/05 - Admitted to [facility] with CAP 01/06 - Respiratory distress requiring intubation. Transferred from [facility] to [facility]  01/06 - TTE >> LVEF 60-65%, estimated PASP   LEVEL OF CARE:  ICU PRIMARY SERVICE:  PCCM CONSULTANTS:  None CODE STATUS: FULL DIET:  TFs 1/09 DVT Px:  Lovenox GI Px:  Protonix  INTERVAL HISTORY:  RASS -1 to +1. Not F/C consistently.    VITAL SIGNS: Temp:  [99.7 F (37.6 C)-100.9 F (38.3 C)] 99.7 F (37.6 C) (01/10 1553) Pulse Rate:  [109-151] 117  (01/10 1800) Resp:  [21-35] 26  (01/10 1800) BP: (119-158)/(67-86) 130/77 mmHg (01/10 1800) SpO2:  [90 %-100 %] 96 % (01/10 1800) Arterial Line BP: (111-116)/(55-67) 114/67 mmHg (01/09 2200) FiO2 (%):  [39.8 %-50.4 %] 50.1 % (01/10 1800) HEMODYNAMICS:   VENTILATOR SETTINGS: Vent Mode:  [-] PRVC FiO2 (%):  [39.8 %-50.4 %] 50.1 % Set Rate:  [20 bmp] 20 bmp Vt Set:  [470 mL] 470 mL PEEP:  [4.8 cmH20-5 cmH20] 5 cmH20 Plateau  Pressure:  [19 cmH20-22 cmH20] 19 cmH20 INTAKE / OUTPUT: Intake/Output      01/10 0701 - 01/11 0700   I.V. (mL/kg) 193.9 (2.1)   NG/GT 220   Total Intake(mL/kg) 413.9 (4.6)   Urine (mL/kg/hr) 575 (0.5)   Total Output 575   Net -161.1         PHYSICAL EXAMINATION: General: Intubated on mechanical ventilation Neuro: sedated, MAEs HEENT: WNL Cardiovascular: regular, no M Lungs: Coarse b/l breath sounds  Abdomen: +bs, soft, obese  Musculoskeletal: Mild edema b/l lower extremities, +2dp b/l  Skin: Dry, warm  LABS: CBC  Lab 04/24/12 0530 04/23/12 0400 04/22/12 0415  WBC 6.4 -- --  HGB 8.7* 9.4* 10.1*  HCT 26.1* 28.1* 30.4*  PLT 169 146* 139*   Chemistry  Lab 04/25/12 0500 04/24/12 0530 04/23/12 0400 04/22/12 0415 04/21/12 0316  NA 143 140 136 -- --  K 3.9 2.9* 3.9 -- --  CL 107 104 102 -- --  CO2 25 24 24  -- --  BUN 11 8 6  -- --  CREATININE 0.52 0.59 0.59 -- --  CALCIUM 8.7 7.8* 8.0* -- --  MG -- -- 2.0 1.9 1.7  PHOS -- -- 2.3 2.6 --  GLUCOSE 217* 142* 132* -- --   Liver function  Lab 04/23/12 0400 04/21/12 0316 04/20/12 1426  AST 27 42* 37  ALT 10 8 10   ALKPHOS 94 58 60  BILITOT 0.3 0.2* 0.1*  PROT 5.6* 5.6* 6.4  ALBUMIN 2.1* 2.4* 2.9*   BNP  Lab 04/24/12 0530 04/23/12 0400 04/22/12 0415  PROBNP 125.0 177.1* 593.2*   ABG  Lab 04/23/12 0958 04/22/12 2126 04/22/12 1711  PHART 7.385 7.384 7.280*  PCO2ART 36.8 40.3 54.5*  PO2ART 88.0 374.0* 65.1*  HCO3 21.6 23.4 24.7*  TCO2 22.7 24.7 26.3    CBG trend  Lab 04/25/12 1535 04/25/12 1151 04/25/12 0750 04/25/12 0432 04/25/12 0028  GLUCAP 231* 294* 200* 193* 217*    IMAGING: CXR: NSC  ECG:   DIAGNOSES:  *ARDS (adult respiratory distress syndrome)  Due to metapneumovirus  DIABETES MELLITUS, TYPE II  TOBACCO ABUSE  HYPERTENSION  GASTROESOPHAGEAL REFLUX DISEASE  Diastolic heart failure  CAP (community acquired pneumonia) - likely viral  Bipolar 1 disorder  Hyponatremia, resolved   Hypokalemia  ASSESSMENT / PLAN:  PULMONARY Assessment:  1) VDRF - secondary to acute on chronic respiratory failure in setting of CAP, ARDS 2) ARDS - improved 3) LLL CAP 4) resp panel positive for metapneumovirus Plan:   Daily WUA/SBT   CARDIOVASCULAR Assessment:  1) Sinus tachycardia - resolved 2) Nonobstructive CAD - per cath in 2007.  3) Hyperlipidemia  4) Hypertension - on home metoprolol.  5) Prolonged QT - likely contributed by her home Geodon and trazadone 6) Grade 2 diastolic dysfunction with secondary PAH by TTE 1/6 - consider possible contribution to her B infiltrates Plan:   - Cont current Rx   RENAL Assessment: Electrolyte abnormalities Plan:   - Continue to monitor renal function and lytes - Correct electrolytes as needed   GASTROINTESTINAL Assessment: No acute issues Plan:   - GI prophylaxis with protonix - Cont TFs  HEMATOLOGIC Assessment: 1) Chronic normocytic anemia - BL Hgb within normal limits in late 2013, previously 10-11. Hgb stable on admission, no indication of acute blood loss.  Plan:   - Continue to monitor CBC - DVT prophylaxis with Lovenox  INFECTIOUS Assessment: 1) Sepsis secondary to severe CAP - on admission had already been treated with 2 days of levaquin. Urine strep is negative, and urine legionella pending. Plan:   - Abx as above   ENDOCRINE Assessment: 1) Diabetes mellitus type 2, A1c 8.2 - only on oral meds at home. Plan:   -Cont ICU hyperglycemia protocol   NEUROLOGIC / PSYCHIATRIC Assessment:  1) Bipolar disorder 2) Schizoaffective disorder 3) History of alcohol abuse - family states this is currently in remission. 4) Sedation Plan:   - Lighten sedation. RASS goal -2. - Resume valproic acid, neurontin and geodon 1/10      CRITICAL CARE: The patient is critically ill with multiple organ systems failure and requires high complexity decision making for assessment and support, frequent evaluation and titration  of therapies, application of advanced monitoring technologies and extensive interpretation of multiple databases. Critical Care Time devoted to patient care services described in this note is 35 minutes. Family updated @ bedside   Billy Fischer, MD ; [facility] [phone].  After 5:30 PM or weekends, call [phone]

## 2012-04-26 ENCOUNTER — Inpatient Hospital Stay (HOSPITAL_COMMUNITY): Payer: Medicare Other

## 2012-04-26 LAB — BASIC METABOLIC PANEL
Calcium: 8.9 mg/dL (ref 8.4–10.5)
GFR calc Af Amer: >90 mL/min (ref >90)
GFR calc non Af Amer: >90 mL/min (ref >90)
Glucose, Bld: 256 mg/dL — ABNORMAL HIGH (ref 70–99)
Sodium: 146 mEq/L — ABNORMAL HIGH (ref 135–145)

## 2012-04-26 LAB — GLUCOSE, CAPILLARY
Glucose-Capillary: 253 mg/dL — ABNORMAL HIGH (ref 70–99)
Glucose-Capillary: 238 mg/dL — ABNORMAL HIGH (ref 70–99)
Glucose-Capillary: 292 mg/dL — ABNORMAL HIGH (ref 70–99)

## 2012-04-26 LAB — CBC
MCH: 27.5 pg (ref 26.0–34.0)
Platelets: 261 10*3/uL (ref 150–400)
RBC: 3.02 MIL/uL — ABNORMAL LOW (ref 3.87–5.11)
WBC: 11.8 10*3/uL — ABNORMAL HIGH (ref 4.0–10.5)

## 2012-04-26 MED ORDER — METOPROLOL TARTRATE 25 MG/10 ML ORAL SUSPENSION
25.0000 mg | Freq: Four times a day (QID) | ORAL | Status: DC
  Administered 2012-04-26 – 2012-04-27 (×4): 25 mg via ORAL
  Filled 2012-04-26 (×8): qty 10

## 2012-04-26 MED ORDER — POTASSIUM CHLORIDE 20 MEQ/15ML (10%) PO LIQD
40.0000 meq | Freq: Three times a day (TID) | ORAL | Status: AC
  Administered 2012-04-26 (×3): 40 meq
  Filled 2012-04-26 (×3): qty 30

## 2012-04-26 MED ORDER — FUROSEMIDE 10 MG/ML IJ SOLN
40.0000 mg | Freq: Two times a day (BID) | INTRAMUSCULAR | Status: AC
  Administered 2012-04-26 (×2): 40 mg via INTRAVENOUS
  Filled 2012-04-26 (×2): qty 4

## 2012-04-26 MED ORDER — FENTANYL CITRATE 0.05 MG/ML IJ SOLN: 25.0000 ug | INTRAMUSCULAR | Status: DC | PRN

## 2012-04-26 MED ORDER — DEXMEDETOMIDINE HCL IN NACL 200 MCG/50ML IV SOLN
0.5000 ug/kg/h | INTRAVENOUS | Status: DC
  Filled 2012-04-26: qty 50

## 2012-04-26 MED ORDER — DEXMEDETOMIDINE HCL IN NACL 200 MCG/50ML IV SOLN
0.4000 ug/kg/h | INTRAVENOUS | Status: DC
  Administered 2012-04-26 – 2012-04-27 (×6): 0.4 ug/kg/h via INTRAVENOUS
  Filled 2012-04-26 (×5): qty 50

## 2012-04-26 NOTE — Progress Notes (Signed)
PULMONARY  / CRITICAL CARE MEDICINE  Name: [name] MRN: [id number] DOB: 11-30-1965    LOS: 6  REFERRING MD :  Osvaldo Shipper  CHIEF COMPLAINT:  Shortness of breath, CAP  BRIEF PATIENT DESCRIPTION: [name] is a 47 y.o., female with a PMHX of DM2 (A1c 8.2), HLD, bipolar disorder, schizoaffective disorder, and alcohol abuse who is admitted to [facility] on 04/26/2012 on transfer from [facility] (admitted on 04/20/2012) with severe CAP in setting of acute respiratory failure requiring intubation on 04/20/2012. Flu negative on nasal swab.  LINES / TUBES: ETT 01/05 >>> L [place] CVC 1/6>>> A-line 1/07 >> out  CULTURES: Respiratory culture 01/06 >> Flu negative, positive for metapneumovirus Influenza panel 01/05 >> NEGATIVE Urine strep 01/05 >> NEGATIVE Urine legionella 01/05 >>> NEGATIVE Blood cx x2 1/6>>> NEGATIVE   ANTIBIOTICS: Levaquin 01/04 >> 01/06 Vancomycin 01/06 >> 1/09 Primaxin 01/06 >> 1/09 azithro 1/6 >> 1/09 Levoflox 1/09 >>    SIGNIFICANT EVENTS:  01/05 - Admitted to [facility] with CAP 01/06 - Respiratory distress requiring intubation. Transferred from [facility] to [facility]  01/06 - TTE >> LVEF 60-65%, estimated PASP   LEVEL OF CARE:  ICU PRIMARY SERVICE:  PCCM CONSULTANTS:  None CODE STATUS: FULL DIET:  TFs 1/09 DVT Px:  Lovenox GI Px:  Protonix  INTERVAL HISTORY:  RASS -1 to +1. Not F/C consistently.    VITAL SIGNS: Temp:  [96.9 F (36.1 C)-101.1 F (38.4 C)] 99 F (37.2 C) (01/11 1551) Pulse Rate:  [90-126] 95  (01/11 1700) Resp:  [20-34] 25  (01/11 1700) BP: (105-146)/(67-104) 134/87 mmHg (01/11 1700) SpO2:  [96 %-100 %] 99 % (01/11 1700) FiO2 (%):  [39 %-50.4 %] 40 % (01/11 1700) HEMODYNAMICS:   VENTILATOR SETTINGS: Vent Mode:  [-] PRVC FiO2 (%):  [39 %-50.4 %] 40 % Set Rate:  [20 bmp] 20 bmp Vt Set:  [470 mL] 470 mL PEEP:  [5 cmH20] 5 cmH20 Pressure Support:  [10 cmH20] 10 cmH20 Plateau Pressure:  [15 cmH20-23 cmH20] 23 cmH20 INTAKE /  OUTPUT: Intake/Output      01/10 0701 - 01/11 0700 01/11 0701 - 01/12 0700   I.V. (mL/kg) 998.9 (11.1) 215.9 (2.4)   NG/GT 480 460   IV Piggyback  4   Total Intake(mL/kg) 1478.9 (16.4) 679.9 (7.5)   Urine (mL/kg/hr) 975 (0.4) 2625 (2.6)   Total Output 975 2625   Net +503.9 -1945.1          PHYSICAL EXAMINATION: General: Intubated on mechanical ventilation Neuro: sedated, MAEs HEENT: WNL Cardiovascular: regular, no M Lungs: Coarse b/l breath sounds  Abdomen: +bs, soft, obese  Musculoskeletal: Mild edema b/l lower extremities, +2dp b/l  Skin: Dry, warm  LABS: CBC  Lab 04/26/12 0416 04/24/12 0530 04/23/12 0400  WBC 11.8* -- --  HGB 8.3* 8.7* 9.4*  HCT 25.6* 26.1* 28.1*  PLT 261 169 146*   Chemistry  Lab 04/26/12 0416 04/25/12 0500 04/24/12 0530 04/23/12 0400 04/22/12 0415 04/21/12 0316  NA 146* 143 140 -- -- --  K 3.5 3.9 2.9* -- -- --  CL 111 107 104 -- -- --  CO2 29 25 24  -- -- --  BUN 11 11 8  -- -- --  CREATININE 0.52 0.52 0.59 -- -- --  CALCIUM 8.9 8.7 7.8* -- -- --  MG -- -- -- 2.0 1.9 1.7  PHOS -- -- -- 2.3 2.6 --  GLUCOSE 256* 217* 142* -- -- --   Liver function  Lab 04/23/12  0400 04/21/12 0316 04/20/12 1426  AST 27 42* 37  ALT 10 8 10   ALKPHOS 94 58 60  BILITOT 0.3 0.2* 0.1*  PROT 5.6* 5.6* 6.4  ALBUMIN 2.1* 2.4* 2.9*   BNP  Lab 04/24/12 0530 04/23/12 0400 04/22/12 0415  PROBNP 125.0 177.1* 593.2*   ABG  Lab 04/23/12 0958 04/22/12 2126 04/22/12 1711  PHART 7.385 7.384 7.280*  PCO2ART 36.8 40.3 54.5*  PO2ART 88.0 374.0* 65.1*  HCO3 21.6 23.4 24.7*  TCO2 22.7 24.7 26.3    CBG trend  Lab 04/26/12 1550 04/26/12 1239 04/26/12 0904 04/26/12 0359 04/25/12 2349  GLUCAP 337* 292* 238* 234* 253*    IMAGING: CXR: NSC  ECG:   DIAGNOSES:  *ARDS (adult respiratory distress syndrome)  Due to metapneumovirus  DIABETES MELLITUS, TYPE II  TOBACCO ABUSE  HYPERTENSION  GASTROESOPHAGEAL REFLUX DISEASE  Diastolic heart failure  CAP (community  acquired pneumonia) - likely viral  Bipolar 1 disorder  Hyponatremia, resolved  Hypokalemia  ASSESSMENT / PLAN: Cont full vent  Cont daily WUA/SBT Begin dexmedetomidine 1/11 Cont nutrition Diuresis as needed to maintain even to neg Abx as above Cont nutritional support Correct electrolyte abnormalities as indicated   Family updated @ bedside  35 mins CCM time   Billy Fischer, MD ; [facility] service Mobile [phone].  After 5:30 PM or weekends, call [phone]

## 2012-04-27 ENCOUNTER — Inpatient Hospital Stay (HOSPITAL_COMMUNITY): Payer: Medicare Other

## 2012-04-27 LAB — CULTURE, BLOOD (ROUTINE X 2): Culture: NO GROWTH

## 2012-04-27 LAB — BASIC METABOLIC PANEL
BUN: 13 mg/dL (ref 6–23)
Creatinine, Ser: 0.55 mg/dL (ref 0.50–1.10)
GFR calc Af Amer: >90 mL/min (ref >90)
GFR calc non Af Amer: >90 mL/min (ref >90)
Glucose, Bld: 279 mg/dL — ABNORMAL HIGH (ref 70–99)
Potassium: 3.8 mEq/L (ref 3.5–5.1)

## 2012-04-27 LAB — GLUCOSE, CAPILLARY
Glucose-Capillary: 217 mg/dL — ABNORMAL HIGH (ref 70–99)
Glucose-Capillary: 258 mg/dL — ABNORMAL HIGH (ref 70–99)
Glucose-Capillary: 313 mg/dL — ABNORMAL HIGH (ref 70–99)

## 2012-04-27 MED ORDER — ALBUTEROL SULFATE (5 MG/ML) 0.5% IN NEBU
2.5000 mg | INHALATION_SOLUTION | RESPIRATORY_TRACT | Status: DC | PRN
  Administered 2012-04-27: 2.5 mg via RESPIRATORY_TRACT
  Filled 2012-04-27: qty 0.5

## 2012-04-27 MED ORDER — FENTANYL CITRATE 0.05 MG/ML IJ SOLN
12.5000 ug | INTRAMUSCULAR | Status: DC | PRN
  Administered 2012-04-27 – 2012-04-28 (×3): 25 ug via INTRAVENOUS
  Filled 2012-04-27 (×3): qty 2

## 2012-04-27 MED ORDER — VALPROIC ACID 250 MG/5ML PO SYRP
250.0000 mg | ORAL_SOLUTION | Freq: Two times a day (BID) | ORAL | Status: DC
  Filled 2012-04-27 (×3): qty 5

## 2012-04-27 MED ORDER — IPRATROPIUM BROMIDE 0.02 % IN SOLN
0.5000 mg | Freq: Four times a day (QID) | RESPIRATORY_TRACT | Status: DC
  Administered 2012-04-27 – 2012-04-30 (×14): 0.5 mg via RESPIRATORY_TRACT
  Filled 2012-04-27 (×14): qty 2.5

## 2012-04-27 MED ORDER — ZIPRASIDONE HCL 40 MG PO CAPS
40.0000 mg | ORAL_CAPSULE | Freq: Two times a day (BID) | ORAL | Status: DC
  Filled 2012-04-27 (×4): qty 1

## 2012-04-27 MED ORDER — ALBUTEROL SULFATE (5 MG/ML) 0.5% IN NEBU
2.5000 mg | INHALATION_SOLUTION | Freq: Four times a day (QID) | RESPIRATORY_TRACT | Status: DC
  Administered 2012-04-27 – 2012-04-30 (×14): 2.5 mg via RESPIRATORY_TRACT
  Filled 2012-04-27 (×14): qty 0.5

## 2012-04-27 MED ORDER — GABAPENTIN 250 MG/5ML PO SOLN
200.0000 mg | Freq: Every day | ORAL | Status: DC
  Administered 2012-04-29 – 2012-05-01 (×4): 200 mg via ORAL
  Filled 2012-04-27 (×9): qty 4

## 2012-04-27 MED ORDER — POTASSIUM CHLORIDE IN NACL 20-0.45 MEQ/L-% IV SOLN
INTRAVENOUS | Status: DC
  Administered 2012-04-27 – 2012-04-28 (×2): via INTRAVENOUS
  Filled 2012-04-27 (×2): qty 1000

## 2012-04-27 MED ORDER — METOPROLOL TARTRATE 1 MG/ML IV SOLN
5.0000 mg | Freq: Four times a day (QID) | INTRAVENOUS | Status: DC
  Administered 2012-04-27 – 2012-04-28 (×2): 5 mg via INTRAVENOUS
  Filled 2012-04-27 (×6): qty 5

## 2012-04-27 MED ORDER — GABAPENTIN 250 MG/5ML PO SOLN: 200.0000 mg | Freq: Two times a day (BID) | ORAL | Status: DC

## 2012-04-27 NOTE — Procedures (Signed)
Extubation Procedure Note  Patient Details:   Name: [name] DOB: 09-26-1965 MRN: [id number]   Airway Documentation:     Evaluation  O2 sats: stable throughout Complications: No apparent complications Patient did tolerate procedure well. Bilateral Breath Sounds: Diminished;Clear Suctioning: Airway Yespt extubated per Dr. Jarrett Ables and placed on 4 L [place].  Pulse stable .  rr slightly elevated at 27-34bpm.  HHN treatment given after extubation  Maciah Schweigert V 04/27/2012, 11:00 AM

## 2012-04-27 NOTE — Progress Notes (Signed)
PULMONARY  / CRITICAL CARE MEDICINE  Name: [name] MRN: [id number] DOB: [birth year]    LOS: 7  REFERRING MD :  Osvaldo Shipper  CHIEF COMPLAINT:  Shortness of breath, CAP  BRIEF PATIENT DESCRIPTION: [name] is a 47 y.o., female with a PMHX of DM2 (A1c 8.2), HLD, bipolar disorder, schizoaffective disorder, and alcohol abuse who is admitted to [facility] on 04/27/2012 on transfer from [facility] (admitted on 04/20/2012) with severe CAP in setting of acute respiratory failure requiring intubation on 04/20/2012. Flu negative on nasal swab.  LINES / TUBES: ETT 01/05 >> 1/12 L [place] CVC 1/6>>  A-line 1/07 >> out  CULTURES: Respiratory culture 01/06 >> Flu negative, positive for metapneumovirus Influenza panel 01/05 >> NEGATIVE Urine strep 01/05 >> NEGATIVE Urine legionella 01/05 >>> NEGATIVE Blood cx x2 1/6>>> NEGATIVE   ANTIBIOTICS: Levaquin 01/04 >> 01/06 Vancomycin 01/06 >> 1/09 Primaxin 01/06 >> 1/09 azithro 1/6 >> 1/09 Levoflox 1/09 >> 1/12   SIGNIFICANT EVENTS:  01/05 - Admitted to [facility] with CAP 01/06 - Respiratory distress requiring intubation. Transferred from [facility] to [facility]  01/06 - TTE >> LVEF 60-65%, estimated PASP   LEVEL OF CARE:  ICU PRIMARY SERVICE:  PCCM CONSULTANTS:  None CODE STATUS: FULL DIET:  TFs 1/09 DVT Px:  Lovenox GI Px:  Protonix  INTERVAL HISTORY:  RASS -1. + F/C. Marginally passed SBT. Extubated and tolerating marginally   VITAL SIGNS: Temp:  [97.9 F (36.6 C)-100 F (37.8 C)] 98.1 F (36.7 C) (01/12 1553) Pulse Rate:  [66-104] 99  (01/12 1800) Resp:  [20-37] 26  (01/12 1800) BP: (96-159)/(66-99) 148/86 mmHg (01/12 1800) SpO2:  [94 %-100 %] 95 % (01/12 1800) FiO2 (%):  [39.6 %-40.4 %] 40.3 % (01/12 1000) HEMODYNAMICS:   VENTILATOR SETTINGS: Vent Mode:  [-] PRVC FiO2 (%):  [39.6 %-40.4 %] 40.3 % Set Rate:  [20 bmp] 20 bmp Vt Set:  [470 mL] 470 mL PEEP:  [5 cmH20] 5 cmH20 Plateau Pressure:  [14 cmH20-26 cmH20] 24  cmH20 INTAKE / OUTPUT: Intake/Output      01/12 0701 - 01/13 0700   P.O.    I.V. (mL/kg) 415.2 (4.6)   NG/GT 60   IV Piggyback    Total Intake(mL/kg) 475.2 (5.3)   Urine (mL/kg/hr) 475 (0.4)   Total Output 475   Net +0.2       Stool Occurrence 1 x     PHYSICAL EXAMINATION: General: RASS -1. + F/C Neuro: sedated, MAEs HEENT: WNL Cardiovascular: tachy, regular, no M Lungs: no wheeze Abdomen: +bs, soft, obese  Musculoskeletal: Mild edema b/l lower extremities, +2dp b/l  Skin: Dry, warm  LABS: CBC  Lab 04/26/12 0416 04/24/12 0530 04/23/12 0400  WBC 11.8* -- --  HGB 8.3* 8.7* 9.4*  HCT 25.6* 26.1* 28.1*  PLT 261 169 146*   Chemistry  Lab 04/27/12 0455 04/26/12 0416 04/25/12 0500 04/23/12 0400 04/22/12 0415 04/21/12 0316  NA 143 146* 143 -- -- --  K 3.8 3.5 3.9 -- -- --  CL 104 111 107 -- -- --  CO2 31 29 25  -- -- --  BUN 13 11 11  -- -- --  CREATININE 0.55 0.52 0.52 -- -- --  CALCIUM 9.4 8.9 8.7 -- -- --  MG -- -- -- 2.0 1.9 1.7  PHOS -- -- -- 2.3 2.6 --  GLUCOSE 279* 256* 217* -- -- --   Liver function  Lab 04/23/12 0400 04/21/12 0316  AST 27 42*  ALT 10  8  ALKPHOS 94 58  BILITOT 0.3 0.2*  PROT 5.6* 5.6*  ALBUMIN 2.1* 2.4*   BNP  Lab 04/24/12 0530 04/23/12 0400 04/22/12 0415  PROBNP 125.0 177.1* 593.2*   ABG  Lab 04/23/12 0958 04/22/12 2126 04/22/12 1711  PHART 7.385 7.384 7.280*  PCO2ART 36.8 40.3 54.5*  PO2ART 88.0 374.0* 65.1*  HCO3 21.6 23.4 24.7*  TCO2 22.7 24.7 26.3    CBG trend  Lab 04/27/12 1543 04/27/12 1234 04/27/12 0733 04/27/12 0326 04/26/12 2346  GLUCAP 217* 273* 282* 258* 313*    IMAGING: CXR: NSC  ECG:   DIAGNOSES:  *ARDS (adult respiratory distress syndrome)  Due to metapneumovirus  DIABETES MELLITUS, TYPE II  TOBACCO ABUSE  HYPERTENSION  GASTROESOPHAGEAL REFLUX DISEASE  Diastolic heart failure  CAP (community acquired pneumonia) - likely viral  Bipolar 1 disorder  Hyponatremia, resolved   Hypokalemia  PLAN: Extubated - monitor closely in ICU post extubation D/C dexmedetomidine  NPO post extubation Diuresis as needed to maintain even to neg Monitor off abx Correct electrolyte abnormalities as indicated Psych meds adjusted due to excessive sedation   30 mins CCM time   Billy Fischer, MD ; [facility] service Mobile [phone].  After 5:30 PM or weekends, call [phone]

## 2012-04-28 ENCOUNTER — Inpatient Hospital Stay (HOSPITAL_COMMUNITY): Payer: Medicare Other

## 2012-04-28 DIAGNOSIS — E876 Hypokalemia: Secondary | ICD-10-CM

## 2012-04-28 DIAGNOSIS — I2721 Secondary pulmonary arterial hypertension: Secondary | ICD-10-CM | POA: Diagnosis present

## 2012-04-28 DIAGNOSIS — J1289 Other viral pneumonia: Secondary | ICD-10-CM

## 2012-04-28 LAB — BASIC METABOLIC PANEL
CO2: 27 mEq/L (ref 19–32)
Chloride: 103 mEq/L (ref 96–112)
Creatinine, Ser: 0.61 mg/dL (ref 0.50–1.10)

## 2012-04-28 LAB — GLUCOSE, CAPILLARY
Glucose-Capillary: 162 mg/dL — ABNORMAL HIGH (ref 70–99)
Glucose-Capillary: 174 mg/dL — ABNORMAL HIGH (ref 70–99)
Glucose-Capillary: 197 mg/dL — ABNORMAL HIGH (ref 70–99)
Glucose-Capillary: 229 mg/dL — ABNORMAL HIGH (ref 70–99)
Glucose-Capillary: 197 mg/dL — ABNORMAL HIGH (ref 70–99)

## 2012-04-28 LAB — CBC
HCT: 27.6 % — ABNORMAL LOW (ref 36.0–46.0)
MCV: 82.4 fL (ref 78.0–100.0)
RBC: 3.35 MIL/uL — ABNORMAL LOW (ref 3.87–5.11)
RDW: 15.4 % (ref 11.5–15.5)
WBC: 12.5 10*3/uL — ABNORMAL HIGH (ref 4.0–10.5)

## 2012-04-28 MED ORDER — TRAZODONE HCL 100 MG PO TABS
100.0000 mg | ORAL_TABLET | Freq: Every day | ORAL | Status: DC
  Administered 2012-04-29: 100 mg via ORAL
  Filled 2012-04-28 (×7): qty 1

## 2012-04-28 MED ORDER — ZIPRASIDONE HCL 80 MG PO CAPS: 160.0000 mg | ORAL_CAPSULE | Freq: Every day | ORAL | Status: DC

## 2012-04-28 MED ORDER — PANTOPRAZOLE SODIUM 40 MG PO TBEC
40.0000 mg | DELAYED_RELEASE_TABLET | Freq: Every day | ORAL | Status: DC
  Administered 2012-04-28 – 2012-05-02 (×5): 40 mg via ORAL
  Filled 2012-04-28 (×5): qty 1

## 2012-04-28 MED ORDER — DIVALPROEX SODIUM ER 500 MG PO TB24
500.0000 mg | ORAL_TABLET | Freq: Every day | ORAL | Status: DC
  Administered 2012-04-29 – 2012-05-01 (×4): 500 mg via ORAL
  Filled 2012-04-28 (×8): qty 1

## 2012-04-28 MED ORDER — VALPROIC ACID 250 MG/5ML PO SYRP: 250.0000 mg | ORAL_SOLUTION | Freq: Two times a day (BID) | ORAL | Status: DC

## 2012-04-28 MED ORDER — INSULIN ASPART 100 UNIT/ML ~~LOC~~ SOLN
0.0000 [IU] | Freq: Three times a day (TID) | SUBCUTANEOUS | Status: DC
  Administered 2012-04-28: 5 [IU] via SUBCUTANEOUS
  Administered 2012-04-28: 3 [IU] via SUBCUTANEOUS
  Administered 2012-04-29: 11 [IU] via SUBCUTANEOUS
  Administered 2012-04-29: 5 [IU] via SUBCUTANEOUS
  Administered 2012-04-29: 8 [IU] via SUBCUTANEOUS
  Administered 2012-04-30: 3 [IU] via SUBCUTANEOUS
  Administered 2012-04-30: 2 [IU] via SUBCUTANEOUS
  Administered 2012-04-30: 5 [IU] via SUBCUTANEOUS
  Administered 2012-05-01: 2 [IU] via SUBCUTANEOUS
  Administered 2012-05-01: 8 [IU] via SUBCUTANEOUS
  Administered 2012-05-02: 2 [IU] via SUBCUTANEOUS
  Administered 2012-05-02: 3 [IU] via SUBCUTANEOUS

## 2012-04-28 MED ORDER — INSULIN GLARGINE 100 UNIT/ML ~~LOC~~ SOLN
10.0000 [IU] | Freq: Every day | SUBCUTANEOUS | Status: DC
  Administered 2012-04-28 – 2012-04-29 (×2): 10 [IU] via SUBCUTANEOUS

## 2012-04-28 MED ORDER — INSULIN ASPART 100 UNIT/ML ~~LOC~~ SOLN: 0.0000 [IU] | Freq: Every day | SUBCUTANEOUS | Status: DC

## 2012-04-28 MED ORDER — METOPROLOL TARTRATE 50 MG PO TABS
50.0000 mg | ORAL_TABLET | Freq: Two times a day (BID) | ORAL | Status: DC
  Administered 2012-04-28 – 2012-05-02 (×9): 50 mg via ORAL
  Filled 2012-04-28 (×14): qty 1

## 2012-04-28 MED ORDER — METOPROLOL TARTRATE 1 MG/ML IV SOLN
2.5000 mg | INTRAVENOUS | Status: DC | PRN
  Administered 2012-04-28 – 2012-04-29 (×3): 2.5 mg via INTRAVENOUS
  Filled 2012-04-28 (×2): qty 5

## 2012-04-28 MED ORDER — POTASSIUM CHLORIDE 10 MEQ/50ML IV SOLN
10.0000 meq | INTRAVENOUS | Status: AC
  Administered 2012-04-28 (×6): 10 meq via INTRAVENOUS
  Filled 2012-04-28 (×3): qty 50
  Filled 2012-04-28: qty 100
  Filled 2012-04-28: qty 50

## 2012-04-28 MED ORDER — DIVALPROEX SODIUM 250 MG PO DR TAB
250.0000 mg | DELAYED_RELEASE_TABLET | Freq: Every day | ORAL | Status: DC
  Administered 2012-04-28 – 2012-05-02 (×5): 250 mg via ORAL
  Filled 2012-04-28 (×5): qty 1

## 2012-04-28 MED ORDER — HYDRALAZINE HCL 20 MG/ML IJ SOLN
10.0000 mg | INTRAMUSCULAR | Status: DC | PRN
  Filled 2012-04-28: qty 2

## 2012-04-28 MED ORDER — GUAIFENESIN ER 600 MG PO TB12
600.0000 mg | ORAL_TABLET | Freq: Two times a day (BID) | ORAL | Status: DC | PRN
  Administered 2012-04-29 – 2012-05-01 (×6): 600 mg via ORAL
  Filled 2012-04-28 (×8): qty 1

## 2012-04-28 MED ORDER — ACETAMINOPHEN 160 MG/5ML PO SOLN
650.0000 mg | Freq: Four times a day (QID) | ORAL | Status: DC | PRN
  Administered 2012-05-02: 650 mg via ORAL
  Filled 2012-04-28: qty 20.3

## 2012-04-28 MED ORDER — INSULIN ASPART 100 UNIT/ML ~~LOC~~ SOLN
4.0000 [IU] | Freq: Three times a day (TID) | SUBCUTANEOUS | Status: DC
  Administered 2012-04-28 – 2012-04-30 (×2): 4 [IU] via SUBCUTANEOUS

## 2012-04-28 MED ORDER — POTASSIUM CHLORIDE CRYS ER 20 MEQ PO TBCR
40.0000 meq | EXTENDED_RELEASE_TABLET | Freq: Once | ORAL | Status: AC
  Administered 2012-04-28: 40 meq via ORAL
  Filled 2012-04-28: qty 2

## 2012-04-28 MED ORDER — METFORMIN HCL 500 MG PO TABS
1000.0000 mg | ORAL_TABLET | Freq: Two times a day (BID) | ORAL | Status: DC
  Administered 2012-04-29 – 2012-04-30 (×2): 1000 mg via ORAL
  Filled 2012-04-28 (×6): qty 2

## 2012-04-28 MED ORDER — ZIPRASIDONE HCL 80 MG PO CAPS
80.0000 mg | ORAL_CAPSULE | Freq: Every day | ORAL | Status: DC
  Administered 2012-04-29 – 2012-05-01 (×4): 80 mg via ORAL
  Filled 2012-04-28 (×7): qty 1

## 2012-04-28 MED ORDER — GLUCERNA SHAKE PO LIQD
237.0000 mL | Freq: Two times a day (BID) | ORAL | Status: DC
  Administered 2012-04-28 – 2012-05-02 (×5): 237 mL via ORAL

## 2012-04-28 NOTE — Progress Notes (Signed)
PULMONARY  / CRITICAL CARE MEDICINE  Name: [name] MRN: [id number] DOB: 01-08-1966    LOS: 8  REFERRING MD :  Osvaldo Shipper  CHIEF COMPLAINT:  Shortness of breath, CAP  BRIEF PATIENT DESCRIPTION: [name] is a 47 y.o., female with a PMHX of DM2 (A1c 8.2), HLD, bipolar disorder, schizoaffective disorder, and alcohol abuse who is admitted to [facility] on 04/28/2012 on transfer from [facility] (admitted on 04/20/2012) with severe CAP in setting of acute respiratory failure requiring intubation on 04/20/2012. Resp virus panel positive for HMPV   LINES / TUBES: ETT 01/05 >> 1/12 L [place] CVC 1/6>>  A-line 1/07 >> out  CULTURES: Respiratory culture 01/06 >> Flu negative, positive for human metapneumovirus Influenza panel 01/05 >> NEGATIVE Urine strep 01/05 >> NEGATIVE Urine legionella 01/05 >>> NEGATIVE Blood cx x2 1/6>>> NEGATIVE   ANTIBIOTICS: Levaquin 01/04 >> 01/06 Vancomycin 01/06 >> 1/09 Primaxin 01/06 >> 1/09 azithro 1/6 >> 1/09 Levoflox 1/09 >> 1/12   SIGNIFICANT EVENTS:  01/05 - Admitted to [facility] with CAP 01/06 - Respiratory distress requiring intubation. Transferred from [facility] to [facility]  01/06 - TTE >> LVEF 60-65%, estimated PASP   LEVEL OF CARE:  ICU PRIMARY SERVICE:  PCCM CONSULTANTS:  None CODE STATUS: FULL DIET:  TFs 1/09 DVT Px:  Lovenox GI Px:  Protonix  INTERVAL HISTORY:  RASS 0. No distress. On Ventimask   VITAL SIGNS: Temp:  [97.9 F (36.6 C)-100.4 F (38 C)] 99.1 F (37.3 C) (01/13 0744) Pulse Rate:  [36-142] 117  (01/13 0700) Resp:  [20-37] 26  (01/13 0700) BP: (138-175)/(65-87) 155/75 mmHg (01/13 0700) SpO2:  [93 %-100 %] 98 % (01/13 0748) FiO2 (%):  [40 %-80 %] 40 % (01/13 0748) Weight:  [88.3 kg (194 lb 10.7 oz)] 88.3 kg (194 lb 10.7 oz) (01/13 0500) HEMODYNAMICS:   VENTILATOR SETTINGS: Vent Mode:  [-]  FiO2 (%):  [40 %-80 %] 40 % INTAKE / OUTPUT: Intake/Output      01/12 0701 - 01/13 0700 01/13 0701 - 01/14 0700   P.O.     I.V. (mL/kg) 1065.2 (12.1)    NG/GT 60    IV Piggyback     Total Intake(mL/kg) 1125.2 (12.7)    Urine (mL/kg/hr) 1245 (0.6)    Total Output 1245    Net -119.8         Stool Occurrence 1 x      PHYSICAL EXAMINATION: General: RASS 0. NAD. Conversant. oriented Neuro: no focal deficits HEENT: WNL Cardiovascular: tachy, regular, no M Lungs: posterior basilar rales Abdomen: +bs, soft, obese  Musculoskeletal: No LE edema Skin: Dry, warm  LABS: CBC  Lab 04/28/12 0330 04/26/12 0416 04/24/12 0530  WBC 12.5* -- --  HGB 9.1* 8.3* 8.7*  HCT 27.6* 25.6* 26.1*  PLT 367 261 169   Chemistry  Lab 04/28/12 0330 04/27/12 0455 04/26/12 0416 04/23/12 0400 04/22/12 0415  NA 141 143 146* -- --  K 2.9* 3.8 3.5 -- --  CL 103 104 111 -- --  CO2 27 31 29  -- --  BUN 13 13 11  -- --  CREATININE 0.61 0.55 0.52 -- --  CALCIUM 9.3 9.4 8.9 -- --  MG -- -- -- 2.0 1.9  PHOS -- -- -- 2.3 2.6  GLUCOSE 174* 279* 256* -- --   Liver function  Lab 04/23/12 0400  AST 27  ALT 10  ALKPHOS 94  BILITOT 0.3  PROT 5.6*  ALBUMIN 2.1*   BNP  Lab  04/24/12 0530 04/23/12 0400 04/22/12 0415  PROBNP 125.0 177.1* 593.2*   ABG  Lab 04/23/12 0958 04/22/12 2126 04/22/12 1711  PHART 7.385 7.384 7.280*  PCO2ART 36.8 40.3 54.5*  PO2ART 88.0 374.0* 65.1*  HCO3 21.6 23.4 24.7*  TCO2 22.7 24.7 26.3    CBG trend  Lab 04/28/12 0742 04/28/12 0338 04/27/12 2314 04/27/12 2015 04/27/12 1543  GLUCAP 174* 162* 174* 158* 217*    IMAGING: CXR: increased bilateral edema/infiltrates  ECG:   Principal Problem:  *CAP/ARDS due to human metapneumovirus pneumonia, resolving  Cont O2  Transfer to SDU  PRN NPPV (has not needed)  PA/Lat CXR 1/14      DIABETES MELLITUS, TYPE II  Begin CHO modified diet  Cont Lantus @ home dose  Change SSI to ACHS  Resume home dose of metformin   Diastolic heart failure  Keep I/Os even to negative as permitted by BP and renal function   HYPERTENSION  Cont PRN  hydralazine to maintain SBP < 170 mmHg   Sinus Tachy  Resume home dose of metoprolol  PRN IV metoprolol to maintain HR < 110/min   Bipolar 1 disorder  Resume outpt pysch meds @ adjusted dosages to avoid over-sedation   PAH (pulmonary artery hypertension)  Likely due to acute pulmonary process. No further indication @ this time   Hypokalemia  Repleted 1/13  Recheck BMET AM 1/14   TOBACCO ABUSE  Counseled to quit     Billy Fischer, MD ; [facility] [phone].  After 5:30 PM or weekends, call [phone]

## 2012-04-28 NOTE — Progress Notes (Signed)
NUTRITION FOLLOW UP  Intervention:   Glucerna Shake po BID, each supplement provides 220 kcal and 10 grams of protein.   Nutrition Dx:   Inadequate oral intake related to inability to eat as evidenced by NPO status; progressing  Goal:  Enteral nutrition to provide 60-70% of estimated calorie needs (22-25 kcals/kg ideal body weight) and >/= 90% of estimated protein needs, based on ASPEN guidelines for permissive underfeeding in critically ill obese individuals; NA  New Goal: Pt to meet >/= 90% of their estimated nutrition needs.  Monitor:   PO intake, supplement acceptance  Assessment:   Pt was extubated on 1/12 after being intubated for severe CAP/ARDS due to human metapneumovirus pneumonia.  Pt remains on droplet precautions. Per pt prior to getting sick her weight was stable and she had a good appetite. Pt is awaiting lunch, which will be her first meal.   Height: Ht Readings from Last 1 Encounters:  04/21/12 5\' 5"  (1.651 m)    Weight Status:   Wt Readings from Last 1 Encounters:  04/28/12 194 lb 10.7 oz (88.3 kg)    Re-estimated needs:  Kcal: 1700-1900 Protein: 85-100 Fluid: >1.7 L/day  Skin: no issues noted  Diet Order: Carb Control, just advanced   Intake/Output Summary (Last 24 hours) at 04/28/12 1133 Last data filed at 04/28/12 0700  Gross per 24 hour  Intake 984.17 ml  Output   1120 ml  Net -135.83 ml    Last BM: 1/12   Labs:   Lab 04/28/12 0330 04/27/12 0455 04/26/12 0416 04/23/12 0400 04/22/12 0415  NA 141 143 146* -- --  K 2.9* 3.8 3.5 -- --  CL 103 104 111 -- --  CO2 27 31 29  -- --  BUN 13 13 11  -- --  CREATININE 0.61 0.55 0.52 -- --  CALCIUM 9.3 9.4 8.9 -- --  MG -- -- -- 2.0 1.9  PHOS -- -- -- 2.3 2.6  GLUCOSE 174* 279* 256* -- --    CBG (last 3)   Basename 04/28/12 0742 04/28/12 0338 04/27/12 2314  GLUCAP 174* 162* 174*    Scheduled Meds:   . albuterol  2.5 mg Nebulization Q6H  . antiseptic oral rinse  15 mL Mouth Rinse QID    . chlorhexidine  15 mL Mouth Rinse BID  . divalproex  500 mg Oral QHS  . divalproex  250 mg Oral Daily  . enoxaparin (LOVENOX) injection  40 mg Subcutaneous Q24H  . gabapentin  200 mg Oral QHS  . insulin aspart  0-15 Units Subcutaneous TID WC  . insulin aspart  0-5 Units Subcutaneous QHS  . insulin aspart  4 Units Subcutaneous TID WC  . insulin glargine  10 Units Subcutaneous QHS  . ipratropium  0.5 mg Nebulization Q6H  . metFORMIN  1,000 mg Oral BID WC  . metoprolol tartrate  50 mg Oral BID  . pantoprazole  40 mg Oral Daily  . potassium chloride  40 mEq Oral Once  . sodium chloride  3 mL Intravenous Q12H  . traZODone  100 mg Oral QHS  . ziprasidone  80 mg Oral QHS    Continuous Infusions:   . sodium chloride Stopped (04/27/12 1119)    Kendell Bane RD, LDN, CNSC [phone] Pager [phone] After Hours Pager

## 2012-04-28 NOTE — Progress Notes (Signed)
Pt was removed from bipap at 2230 due to intolerance and placed on a [place] at 4lpm.  Saturations are at 99-100% at this time.

## 2012-04-28 NOTE — Progress Notes (Signed)
UR completed 

## 2012-04-28 NOTE — Progress Notes (Signed)
Pt. Stated "I want to kill myself, it is not worth suffering like this anymore"  Per [place], NT  Dr. Sung Amabile notified.  Suicide precautions ordered and initiated.  [place], NT sitting with patient at this time.  Will continue to monitor.

## 2012-04-28 NOTE — Progress Notes (Signed)
Inpatient Diabetes Program Recommendations  AACE/ADA: New Consensus Statement on Inpatient Glycemic Control (2013)  Target Ranges:  Prepandial:   less than 140 mg/dL      Peak postprandial:   less than 180 mg/dL (1-2 hours)      Critically ill patients:  140 - 180 mg/dL   Reason for Visit: Pt started on po's today  Please do not use Metformin while in the hospital. Pt just started eating, and this can Upset her stomach/digestion.   Note: Thank you, Lenor Coffin, RN, CNS, Diabetes Coordinator [phone])

## 2012-04-28 NOTE — Progress Notes (Signed)
Cough  Mucinex ordered

## 2012-04-28 NOTE — Progress Notes (Signed)
eLink Physician-Brief Progress Note Patient Name: [name] DOB: 14-Aug-1965 MRN: [id number]  Date of Service  04/28/2012   HPI/Events of Note  hypokalemia   eICU Interventions  Potassium replaced   Intervention Category Intermediate Interventions: Electrolyte abnormality - evaluation and management  DETERDING,ELIZABETH 04/28/2012, 5:17 AM

## 2012-04-29 ENCOUNTER — Inpatient Hospital Stay (HOSPITAL_COMMUNITY): Payer: Medicare Other

## 2012-04-29 DIAGNOSIS — F319 Bipolar disorder, unspecified: Secondary | ICD-10-CM

## 2012-04-29 LAB — BASIC METABOLIC PANEL
GFR calc Af Amer: >90 mL/min (ref >90)
GFR calc non Af Amer: >90 mL/min (ref >90)
Potassium: 3.8 mEq/L (ref 3.5–5.1)
Sodium: 141 mEq/L (ref 135–145)

## 2012-04-29 LAB — GLUCOSE, CAPILLARY

## 2012-04-29 MED ORDER — FUROSEMIDE 10 MG/ML IJ SOLN
20.0000 mg | Freq: Once | INTRAMUSCULAR | Status: AC
  Administered 2012-04-29: 20 mg via INTRAVENOUS
  Filled 2012-04-29: qty 2

## 2012-04-29 NOTE — Progress Notes (Signed)
PULMONARY  / CRITICAL CARE MEDICINE  Name: BIANA HAGGAR MRN: [id number] DOB: 1965/12/17    LOS: 9  REFERRING MD :  Osvaldo Shipper  CHIEF COMPLAINT:  Shortness of breath, CAP  BRIEF PATIENT DESCRIPTION: [name] is a 47 y.o., female with a PMHX of DM2 (A1c 8.2), HLD, bipolar disorder, schizoaffective disorder, and alcohol abuse who is admitted to [facility] on 04/29/2012 on transfer from [facility] (admitted on 04/20/2012) with severe CAP in setting of acute respiratory failure requiring intubation on 04/20/2012. Resp virus panel positive for HMPV   LINES / TUBES: ETT 01/05 >> 1/12 L Harlan CVC 1/6>>  A-line 1/07 >> out  CULTURES: Respiratory culture 01/06 >> Flu negative, positive for human metapneumovirus Influenza panel 01/05 >> NEGATIVE Urine strep 01/05 >> NEGATIVE Urine legionella 01/05 >>> NEGATIVE Blood cx x2 1/6>>> NEGATIVE   ANTIBIOTICS: Levaquin 01/04 >> 01/06 Vancomycin 01/06 >> 1/09 Primaxin 01/06 >> 1/09 azithro 1/6 >> 1/09 Levoflox 1/09 >> 1/12   SIGNIFICANT EVENTS:  01/05 - Admitted to [facility] with CAP 01/06 - Respiratory distress requiring intubation. Transferred from [facility] to [facility]  01/06 - TTE >> LVEF 60-65%, estimated PASP   LEVEL OF CARE:  ICU PRIMARY SERVICE:  PCCM CONSULTANTS:  None CODE STATUS: FULL DIET:  TFs 1/09 DVT Px:  Lovenox GI Px:  Protonix  INTERVAL HISTORY:  RASS 0. No distress. On Ventimask  VITAL SIGNS: Temp:  [98.7 F (37.1 C)-99.7 F (37.6 C)] 99 F (37.2 C) (01/14 0810) Pulse Rate:  [103-135] 120  (01/14 0911) Resp:  [26-37] 31  (01/14 0810) BP: (133-168)/(59-105) 133/105 mmHg (01/14 0911) SpO2:  [92 %-100 %] 100 % (01/14 0914) FiO2 (%):  [30 %-40 %] 30 % (01/13 2027) HEMODYNAMICS:   VENTILATOR SETTINGS: Vent Mode:  [-]  FiO2 (%):  [30 %-40 %] 30 % INTAKE / OUTPUT: Intake/Output      01/13 0701 - 01/14 0700 01/14 0701 - 01/15 0700   P.O. 360    I.V. (mL/kg) 10 (0.1)    NG/GT     Total Intake(mL/kg) 370  (4.2)    Urine (mL/kg/hr) 150 (0.1)    Total Output 150    Net +220         Urine Occurrence 1 x    Stool Occurrence 1 x     PHYSICAL EXAMINATION: General: Conversant. Oriented. Neuro: no focal deficits. HEENT: WNL. Cardiovascular: tachy, regular, no M. Lungs: posterior basilar rales. Abdomen: +bs, soft, obese. Musculoskeletal: No LE edema. Skin: Dry, warm.  LABS: CBC  Lab 04/28/12 0330 04/26/12 0416 04/24/12 0530  WBC 12.5* -- --  HGB 9.1* 8.3* 8.7*  HCT 27.6* 25.6* 26.1*  PLT 367 261 169   Chemistry  Lab 04/29/12 0432 04/28/12 0330 04/27/12 0455 04/23/12 0400  NA 141 141 143 --  K 3.8 2.9* 3.8 --  CL 103 103 104 --  CO2 26 27 31  --  BUN 9 13 13  --  CREATININE 0.55 0.61 0.55 --  CALCIUM 8.7 9.3 9.4 --  MG -- -- -- 2.0  PHOS -- -- -- 2.3  GLUCOSE 243* 174* 279* --   Liver function  Lab 04/23/12 0400  AST 27  ALT 10  ALKPHOS 94  BILITOT 0.3  PROT 5.6*  ALBUMIN 2.1*   BNP  Lab 04/24/12 0530 04/23/12 0400  PROBNP 125.0 177.1*   ABG  Lab 04/23/12 0958 04/22/12 2126 04/22/12 1711  PHART 7.385 7.384 7.280*  PCO2ART 36.8 40.3 54.5*  PO2ART 88.0  374.0* 65.1*  HCO3 21.6 23.4 24.7*  TCO2 22.7 24.7 26.3    CBG trend  Lab 04/29/12 0813 04/28/12 2147 04/28/12 1708 04/28/12 1207 04/28/12 0742  GLUCAP 243* 197* 229* 197* 174*    IMAGING: CXR: increased bilateral edema/infiltrates  ECG:   Principal Problem:  *CAP/ARDS due to human metapneumovirus pneumonia, resolving  Cont O2 and BiPAP as needed, patient is noted to be breathing in the mid 30's but appears comfortable, will continue to cycle on and off BiPAP.  Hold in SDU  PA/Lat CXR 1/14    DIABETES MELLITUS, TYPE II  Begin CHO modified diet  Cont Lantus @ home dose  Change SSI to ACHS  Resume home dose of metformin   Diastolic heart failure  Keep I/Os even to negative as permitted by BP and renal function   HYPERTENSION  Cont PRN hydralazine to maintain SBP < 170 mmHg   Sinus  Tachy  Continue home dose of metoprolol, if persistently elevated when not in respiratory distress will consider increase to 75 mg BID.  PRN IV metoprolol to maintain HR < 110/min   Bipolar 1 disorder  Resume outpt pysch meds @ adjusted dosages to avoid over-sedation.  Will call psych for suicide attempt.   PAH (pulmonary artery hypertension)  Likely due to acute pulmonary process. No further indication @ this time.   Hypokalemia  Repleted 1/13  Recheck BMET AM 1/14   TOBACCO ABUSE  Counseled to quit  Alyson Reedy, M.D. [facility] Pulmonary/Critical Care Medicine. Pager: [phone]. After hours pager: [phone].

## 2012-04-29 NOTE — Progress Notes (Signed)
Inpatient Diabetes Program Recommendations  AACE/ADA: New Consensus Statement on Inpatient Glycemic Control (2013)  Target Ranges:  Prepandial:   less than 140 mg/dL      Peak postprandial:   less than 180 mg/dL (1-2 hours)      Critically ill patients:  140 - 180 mg/dL  Results for LYNNIAH, JANOSKI (MRN [id number]) as of 04/29/2012 14:38  Ref. Range 04/28/2012 12:07 04/28/2012 17:08 04/28/2012 21:47 04/29/2012 08:13 04/29/2012 11:47  Glucose-Capillary Latest Range: 70-99 mg/dL 409 (H) 811 (H) 914 (H) 243 (H) 297 (H)   Inpatient Diabetes Program Recommendations Insulin - Basal: Increase Lantus to 15 units  Thank you  Piedad Climes BSN, RN,CDE Inpatient Diabetes Coordinator [phone] (team pager)

## 2012-04-29 NOTE — Consult Note (Signed)
Reason for Consult: SI? Referring Physician: unknown  [name] is an 47 y.o. female.  HPI:   [name] is a 47 y.o., female with a PMHX of DM2 (A1c 8.2), HLD, bipolar disorder, schizoaffective disorder, and alcohol abuse who is admitted to [facility] on 04/21/2012 on transfer from [facility] (admitted on 04/20/2012) with severe CAP in setting of acute respiratory failure requiring intubation on 04/20/2012.  Very stressed out now. Not sure if she was taking her psy meds before admission. Not able to talk much now. Thinks her medical issues affecting her mood. Admits SI thoughts now due to being stressed out. Thinks her current psy meds helps her. Denies any psychosis now.   Past Medical History  Diagnosis Date  . Arteriosclerotic cardiovascular disease (ASCVD)     Minimal at cath in [facility].stress nuclear study in 8/08 with nl EF; neg stress echo in 2010  . Diabetes mellitus, type 2 2000    Onset in 2000; no insulin  . Hyperlipidemia   . Hypertension `    during treatment with Geodon  . Gastroesophageal reflux disease     Schatzki's ring  . Anemia, iron deficiency   . Alcohol abuse   . Depression   . Community acquired pneumonia 01/03/10    2011; with pleural effusion-[facility] acute resp failure  . Obesity   . Community acquired pneumonia 05/2010  . Schizoaffective disorder     requiring multiple psychiatric admissions  . Dysphagia   . Diastolic dysfunction     grade 2 per echo 2011    Past Surgical History  Procedure Date  . Dilation and curettage, diagnostic / therapeutic 1992  . Esophagogastroduodenoscopy 09/16/08    Dr. Ronni Rumble hiatal hernia/excoriations involving the cardia and mucosa consistent with trauma, antral erosions  of linear petechiae ? gastritis versus early gastric antral vascular  ectasia.Marland Kitchen biopsy showed reactive gastropathy. No H. pylori.  . Esophagogastroduodenoscopy 09/2007    Dr. Rinaldo Ratel ring, dilated to 56  French Maloney dilator, small hiatal hernia, antral erosions, biopsies reactive gastropathy.  Gaspar Bidding dilation 07/17/2011    [facility] sedation-->distal esophageal stricture s/p dilation, chronic gastritis, multiple ulcers in stomach. no h.pylori  . Colonoscopy 01/2006    internal hemorrhoids  . Colonoscopy 01/10/2012    Procedure: COLONOSCOPY;  Surgeon: Corbin Ade, MD;  Location: [facility]S;  Service: Endoscopy;  Laterality: N/A;  entered cecum @ [phone] ; total cecal withdrawal time = 8 minutes    Family History  Problem Relation Age of Onset  . Colon cancer Other   . Hypertension Mother   . Stroke Father     deceased at age 23  . Heart disease Sister   . Anesthesia problems Neg Hx   . Hypotension Neg Hx   . Malignant hyperthermia Neg Hx   . Pseudochol deficiency Neg Hx     Social History:  reports that she has been smoking Cigarettes.  She has a 45 pack-year smoking history. She does not have any smokeless tobacco history on file. She reports that she does not drink alcohol or use illicit drugs.  Allergies:  Allergies  Allergen Reactions  . Metronidazole Shortness Of Breath and Swelling  . Orange Itching  . Shrimp (Shellfish Allergy) Shortness Of Breath and Itching    Takes Benadryl before eating shrimp.  Marland Kitchen Penicillins Hives and Swelling    Fever as well  . Sulfonamide Derivatives Hives    fever  . Glipizide Other (See Comments)  psychosis  . Sulfamethoxazole W-Trimethoprim Rash    Medications: I have reviewed the patient's current medications.  Results for orders placed during the hospital encounter of 04/20/12 (from the past 48 hour(s))  GLUCOSE, CAPILLARY     Status: Abnormal   Collection Time   04/27/12 11:14 PM      Component Value Range Comment   Glucose-Capillary 174 (*) 70 - 99 mg/dL   BASIC METABOLIC PANEL     Status: Abnormal   Collection Time   04/28/12  3:30 AM      Component Value Range Comment   Sodium 141  135 - 145 mEq/L    Potassium 2.9 (*) 3.5 -  5.1 mEq/L DELTA CHECK NOTED   Chloride 103  96 - 112 mEq/L    CO2 27  19 - 32 mEq/L    Glucose, Bld 174 (*) 70 - 99 mg/dL    BUN 13  6 - 23 mg/dL    Creatinine, Ser 1.61  0.50 - 1.10 mg/dL    Calcium 9.3  8.4 - 09.6 mg/dL    GFR calc non Af Amer >90  >90 mL/min    GFR calc Af Amer >90  >90 mL/min   CBC     Status: Abnormal   Collection Time   04/28/12  3:30 AM      Component Value Range Comment   WBC 12.5 (*) 4.0 - 10.5 K/uL    RBC 3.35 (*) 3.87 - 5.11 MIL/uL    Hemoglobin 9.1 (*) 12.0 - 15.0 g/dL    HCT 04.5 (*) 40.9 - 46.0 %    MCV 82.4  78.0 - 100.0 fL    MCH 27.2  26.0 - 34.0 pg    MCHC 33.0  30.0 - 36.0 g/dL    RDW 81.1  91.4 - 78.2 %    Platelets 367  150 - 400 K/uL   GLUCOSE, CAPILLARY     Status: Abnormal   Collection Time   04/28/12  3:38 AM      Component Value Range Comment   Glucose-Capillary 162 (*) 70 - 99 mg/dL   GLUCOSE, CAPILLARY     Status: Abnormal   Collection Time   04/28/12  7:42 AM      Component Value Range Comment   Glucose-Capillary 174 (*) 70 - 99 mg/dL   GLUCOSE, CAPILLARY     Status: Abnormal   Collection Time   04/28/12 12:07 PM      Component Value Range Comment   Glucose-Capillary 197 (*) 70 - 99 mg/dL   GLUCOSE, CAPILLARY     Status: Abnormal   Collection Time   04/28/12  5:08 PM      Component Value Range Comment   Glucose-Capillary 229 (*) 70 - 99 mg/dL    Comment 1 Notify RN     GLUCOSE, CAPILLARY     Status: Abnormal   Collection Time   04/28/12  9:47 PM      Component Value Range Comment   Glucose-Capillary 197 (*) 70 - 99 mg/dL    Comment 1 Documented in Chart      Comment 2 Notify RN     BASIC METABOLIC PANEL     Status: Abnormal   Collection Time   04/29/12  4:32 AM      Component Value Range Comment   Sodium 141  135 - 145 mEq/L    Potassium 3.8  3.5 - 5.1 mEq/L    Chloride 103  96 - 112 mEq/L  CO2 26  19 - 32 mEq/L    Glucose, Bld 243 (*) 70 - 99 mg/dL    BUN 9  6 - 23 mg/dL    Creatinine, Ser 1.61  0.50 - 1.10 mg/dL      Calcium 8.7  8.4 - 10.5 mg/dL    GFR calc non Af Amer >90  >90 mL/min    GFR calc Af Amer >90  >90 mL/min   GLUCOSE, CAPILLARY     Status: Abnormal   Collection Time   04/29/12  8:13 AM      Component Value Range Comment   Glucose-Capillary 243 (*) 70 - 99 mg/dL    Comment 1 Notify RN     GLUCOSE, CAPILLARY     Status: Abnormal   Collection Time   04/29/12 11:47 AM      Component Value Range Comment   Glucose-Capillary 297 (*) 70 - 99 mg/dL    Comment 1 Notify RN     GLUCOSE, CAPILLARY     Status: Abnormal   Collection Time   04/29/12  5:10 PM      Component Value Range Comment   Glucose-Capillary 304 (*) 70 - 99 mg/dL    Comment 1 Notify RN       Dg Chest 2 View  04/29/2012  *RADIOLOGY REPORT*  Clinical Data: Shortness of breath and follow up infiltrates.  CHEST - 2 VIEW  Comparison: 04/28/2012  Findings: Two views of the chest were obtained.  There are patchy parenchymal densities throughout both lungs.  The parenchymal disease may have slightly progressed in the left upper lung.  The left subclavian central line has been removed.  Heart size is stable.  No evidence for a pneumothorax.  No evidence for effusions.  IMPRESSION: Patchy parenchymal densities in the lungs could represent edema or atypical infection.  There may be slight progression of disease in the left upper lung.   Original Report Authenticated By: Richarda Overlie, M.D.    Dg Chest Port 1 View  04/28/2012  *RADIOLOGY REPORT*  Clinical Data: Follow-up respiratory failure.  PORTABLE CHEST - 1 VIEW  Comparison: Most recent 04/27/2012  Findings: Endotracheal tube has been removed.  A central venous catheter tip lies at the cavoatrial junction, unchanged. Borderline cardiomegaly.  Worsening bilateral edema, greater on the right.  No effusion or pneumothorax.  Osseous structures unremarkable.  IMPRESSION: Worsening pulmonary edema.  Endotracheal tube removed.   Original Report Authenticated By: Davonna Belling, M.D.     ROS Blood  pressure 147/82, pulse 123, temperature 98.4 F (36.9 C), temperature source Oral, resp. rate 30, height 5\' 5"  (1.651 m), weight 88.3 kg (194 lb 10.7 oz), last menstrual period 04/29/2012, SpO2 99.00%. Physical Exam  Mental Status Examination/Evaluation:  Appearance: on bed  Eye Contact:: poor  Speech: normal  Volume: Normal  Mood: depressed  Affect: ristricted  Thought Process: organized  Orientation: Full  Thought Content: NO AVH  Suicidal Thoughts: yes at times no plans  Homicidal Thoughts: no  Memory: Recent; Poor  Judgement: Impaired  Insight: Lacking  Psychomotor Activity: Normal  Concentration: poor  Recall: poor  Akathisia: No  Assessment:  AXIS I: Bipolar d/o   AXIS II: Deferred  AXIS III: see emdical hx ? ?  ? ?  ?  ? ?  ?  ? ?  ?  ? ?  ?  ?  AXIS IV: unknown  AXIS V: 35  ?  Treatment Plan/Recommendations:  1. Continue current psy meds  2. Will  recommend depakaote level in AM  3. Will continue to follow  Wonda Cerise 04/29/2012, 9:15 PM

## 2012-04-29 NOTE — Progress Notes (Signed)
Pt off bipap most of today.  Pt placed on [place] at 5lpm. Plan to titrate o2 as tolerated.

## 2012-04-29 NOTE — Evaluation (Signed)
Physical Therapy Evaluation Patient Details Name: [name] MRN: [id number] DOB: 07-19-1965 Today's Date: 04/29/2012 Time: 4098-1191 PT Time Calculation (min): 17 min  PT Assessment / Plan / Recommendation Clinical Impression  Pt admitted with ARDS with VDRF 1/5-1/12, human metapneumovirus with decreased acitivity tolerance, safety awareness and mobility at this time. Pt will benefit from acute therapy to maximize mobility, gait, function and activity tolerance prior to discharge to increase safety and decrease burden of care. Recommend OOB daily with nursing.     PT Assessment  Patient needs continued PT services    Follow Up Recommendations  Home health PT;Supervision for mobility/OOB    Does the patient have the potential to tolerate intense rehabilitation      Barriers to Discharge None      Equipment Recommendations  Rolling walker with 5" wheels    Recommendations for Other Services OT consult   Frequency Min 3X/week    Precautions / Restrictions Precautions Precautions: Fall   Pertinent Vitals/Pain sats 92% on 6L [place] with ambulation and HR up to 135 with gait, 105 at rest      Mobility  Bed Mobility Bed Mobility: Supine to Sit Supine to Sit: 5: Supervision;HOB elevated (HOB 15degrees) Details for Bed Mobility Assistance: cueing for safety as pt somewhat impulsive with getting to EOB Transfers Transfers: Sit to Stand;Stand to Sit Sit to Stand: 4: Min guard;From bed Stand to Sit: 4: Min assist;To chair/3-in-1 Details for Transfer Assistance: cueing for hand placement and safety with assist to control descent as pt not backing fully to surface or reaching back to surface despite cueing Ambulation/Gait Ambulation/Gait Assistance: 4: Min assist Ambulation Distance (Feet): 40 Feet Assistive device: Rolling walker Ambulation/Gait Assistance Details: max cueing for posture and position in RW as pt with tendency to maintain trunk flexion, self posterior to RW and  several times stepping foot outside the side of RW. Pt reported dizziness and need to cease ambulation with HR up to 135 with gait. Gait Pattern: Shuffle;Trunk flexed;Wide base of support Gait velocity: decreased Stairs: No    Shoulder Instructions     Exercises     PT Diagnosis: Difficulty walking  PT Problem List: Decreased activity tolerance;Decreased mobility;Decreased knowledge of use of DME;Cardiopulmonary status limiting activity PT Treatment Interventions: Gait training;Stair training;Functional mobility training;DME instruction;Therapeutic activities;Therapeutic exercise;Patient/family education   PT Goals Acute Rehab PT Goals PT Goal Formulation: With patient Time For Goal Achievement: 05/13/12 Potential to Achieve Goals: Good Pt will go Sit to Stand: with supervision PT Goal: Sit to Stand - Progress: Goal set today Pt will go Stand to Sit: with supervision PT Goal: Stand to Sit - Progress: Goal set today Pt will Ambulate: with supervision;>150 feet;with least restrictive assistive device PT Goal: Ambulate - Progress: Goal set today Pt will Go Up / Down Stairs: 3-5 stairs;with min assist;with rail(s) PT Goal: Up/Down Stairs - Progress: Goal set today  Visit Information  Last PT Received On: 04/29/12 Assistance Needed: +1    Subjective Data  Subjective: I want to start getting up and moving Patient Stated Goal: be able to return home   Prior Functioning  Home Living Lives With: Alone Available Help at Discharge: Family;Available 24 hours/day (mother can come stay with pt) Type of Home: House Home Access: Stairs to enter [employer] of Steps: 3 Entrance Stairs-Rails: Right;Left Home Layout: One level Bathroom Shower/Tub: Forensic scientist: Standard Home Adaptive Equipment: None Prior Function Level of Independence: Independent Able to Take Stairs?: Yes Driving: Yes Vocation: Unemployed  Communication Communication: No  difficulties    Cognition  Overall Cognitive Status: Impaired Area of Impairment: Safety/judgement Arousal/Alertness: Awake/alert Orientation Level: Appears intact for tasks assessed Behavior During Session: [facility] for tasks performed Safety/Judgement: Decreased safety judgement for tasks assessed    Extremity/Trunk Assessment Right Upper Extremity Assessment RUE ROM/Strength/Tone: [facility] for tasks assessed Left Upper Extremity Assessment LUE ROM/Strength/Tone: WFL for tasks assessed Right Lower Extremity Assessment RLE ROM/Strength/Tone: [facility] for tasks assessed Left Lower Extremity Assessment LLE ROM/Strength/Tone: WFL for tasks assessed Trunk Assessment Trunk Assessment: Normal   Balance Static Sitting Balance Static Sitting - Balance Support: No upper extremity supported;Feet supported Static Sitting - Level of Assistance: 7: Independent Static Sitting - Comment/# of Minutes: 3 min , pt able to don socks EOB without assist or LOB  End of Session PT - End of Session Equipment Utilized During Treatment: Gait belt;Oxygen Activity Tolerance: Patient limited by fatigue Patient left: in chair;with call bell/phone within reach;Other (comment) (sitter present in room) Nurse Communication: Mobility status  GP     Delorse Lek 04/29/2012, 8:44 AM  Delaney Meigs, PT [phone]

## 2012-04-30 ENCOUNTER — Inpatient Hospital Stay (HOSPITAL_COMMUNITY): Payer: Medicare Other

## 2012-04-30 DIAGNOSIS — R197 Diarrhea, unspecified: Secondary | ICD-10-CM

## 2012-04-30 LAB — BASIC METABOLIC PANEL
CO2: 25 mEq/L (ref 19–32)
Chloride: 101 mEq/L (ref 96–112)
Creatinine, Ser: 0.57 mg/dL (ref 0.50–1.10)

## 2012-04-30 LAB — CBC
HCT: 28 % — ABNORMAL LOW (ref 36.0–46.0)
MCV: 83.1 fL (ref 78.0–100.0)
Platelets: 409 10*3/uL — ABNORMAL HIGH (ref 150–400)
RBC: 3.37 MIL/uL — ABNORMAL LOW (ref 3.87–5.11)
WBC: 10.6 10*3/uL — ABNORMAL HIGH (ref 4.0–10.5)

## 2012-04-30 LAB — MAGNESIUM: Magnesium: 1.9 mg/dL (ref 1.5–2.5)

## 2012-04-30 LAB — GLUCOSE, CAPILLARY
Glucose-Capillary: 182 mg/dL — ABNORMAL HIGH (ref 70–99)
Glucose-Capillary: 130 mg/dL — ABNORMAL HIGH (ref 70–99)

## 2012-04-30 MED ORDER — INSULIN GLARGINE 100 UNIT/ML ~~LOC~~ SOLN
14.0000 [IU] | Freq: Every day | SUBCUTANEOUS | Status: DC
  Administered 2012-04-30 – 2012-05-01 (×2): 14 [IU] via SUBCUTANEOUS

## 2012-04-30 MED ORDER — BIOTENE DRY MOUTH MT LIQD
15.0000 mL | Freq: Two times a day (BID) | OROMUCOSAL | Status: DC
  Administered 2012-04-30 – 2012-05-02 (×4): 15 mL via OROMUCOSAL

## 2012-04-30 MED ORDER — POTASSIUM CHLORIDE CRYS ER 20 MEQ PO TBCR
40.0000 meq | EXTENDED_RELEASE_TABLET | Freq: Two times a day (BID) | ORAL | Status: AC
  Administered 2012-04-30 – 2012-05-01 (×3): 40 meq via ORAL
  Filled 2012-04-30: qty 1
  Filled 2012-04-30 (×3): qty 2

## 2012-04-30 MED ORDER — INSULIN ASPART 100 UNIT/ML ~~LOC~~ SOLN
6.0000 [IU] | Freq: Three times a day (TID) | SUBCUTANEOUS | Status: DC
  Administered 2012-04-30 – 2012-05-02 (×6): 6 [IU] via SUBCUTANEOUS

## 2012-04-30 MED ORDER — ALBUTEROL SULFATE (5 MG/ML) 0.5% IN NEBU
2.5000 mg | INHALATION_SOLUTION | Freq: Four times a day (QID) | RESPIRATORY_TRACT | Status: DC
  Administered 2012-04-30 – 2012-05-01 (×5): 2.5 mg via RESPIRATORY_TRACT
  Filled 2012-04-30 (×5): qty 0.5

## 2012-04-30 MED ORDER — PHENOL 1.4 % MT LIQD
1.0000 | OROMUCOSAL | Status: DC | PRN
Start: 2012-04-30 — End: 2012-05-02
  Administered 2012-04-30: 1 via OROMUCOSAL
  Filled 2012-04-30: qty 177

## 2012-04-30 MED ORDER — IPRATROPIUM BROMIDE 0.02 % IN SOLN
0.5000 mg | Freq: Four times a day (QID) | RESPIRATORY_TRACT | Status: DC
  Administered 2012-04-30 – 2012-05-01 (×5): 0.5 mg via RESPIRATORY_TRACT
  Filled 2012-04-30 (×5): qty 2.5

## 2012-04-30 MED ORDER — ALBUTEROL SULFATE (5 MG/ML) 0.5% IN NEBU
2.5000 mg | INHALATION_SOLUTION | RESPIRATORY_TRACT | Status: DC | PRN
  Administered 2012-05-01: 2.5 mg via RESPIRATORY_TRACT
  Filled 2012-04-30: qty 0.5

## 2012-04-30 NOTE — Progress Notes (Signed)
[facility] Progress Note [place] TEAM 1 - Stepdown/ICU TEAM   [name] WUJ:[id number] DOB: June 03, 1965 DOA: 04/20/2012 PCP: Stacy Gardner, MD  Brief narrative: 47 y.o. female with a past medical history of bipolar disorder, diabetes, hypertension, tobacco abuse, who was in her usual state of health till 2 days prior to admission when she started having a cough, which was dry. She subsequently started getting fever, although she did not check her temperature. This was followed by onset of vomiting. She reported that her son was sick with a cough and cold symptoms a few days. She had been wheezing at home. She came in to the emergency department, was diagnosed with a pneumonia, and was treated with antibiotics and was discharged home with antibiotics. However, the patient's symptoms continued to get worse. She got more and more short of breath and so decided to come back in to the hospital.   She was admitted to [facility] by [facility] on 04/20/2012 and tx was begun for suspected CAP.  She quickly deteriorated from a respiratory standpoint, and ultimately required intubation on 04/20/2012 around 11pm.  She was transferred to [facility] on 04/21/2012, and her care was assumed by PCCM.  She was diagnosed w/ HMPV pneumonia and ARDS.  She was able to be successfully extubated on 04/27/2013.    On 04/28/2012, while still in the ICU, she related to her RN that she wanted to kill herself and was tired of suffering.  As a result Psych was consulted, and she was placed on suicidal precautions.  [facility] has assumed care of the pt again as of 04/30/2012/  Assessment/Plan:  CAP/ARDS due to human metapneumovirus pneumonia  Cont O2 and BiPAP as needed  - appears to be slowly improving   DIABETES MELLITUS, TYPE II  CHO modified diet - Lantus + meal coverage + SSI  Diastolic heart failure  Keep I/Os even to negative as permitted by BP and renal function   HYPERTENSION  Well controlled at present   Sinus Tachy    Appears to be controlled at this time - likely driven by respiratory distress  Bipolar 1 disorder  Psych following for SI - suicide precautions/sitter  PAH (pulmonary artery hypertension)  Likely due to acute pulmonary process - no further evaluation indicated at this time   Hypokalemia  Likely due to GI loss - replace and follow   TOBACCO ABUSE  Counseled to quit   Code Status: FULL Family Communication: spoke w/ pt and multiple family members at bedside Disposition Plan: SDU  Consultants: PCCM  LINES / TUBES:  ETT 01/05 >> 1/12  L [place] CVC 1/6>>  A-line 1/07 >> out   CULTURES:  Respiratory culture 01/06 >> Flu negative, positive for human metapneumovirus  Influenza panel 01/05 >> NEGATIVE  Urine strep 01/05 >> NEGATIVE  Urine legionella 01/05 >>> NEGATIVE  Blood cx x2 1/6>>> NEGATIVE   ANTIBIOTICS:  Levaquin 01/04 >> 01/06  Vancomycin 01/06 >> 1/09  Primaxin 01/06 >> 1/09  azithro 1/6 >> 1/09  Levoflox 1/09 >> 1/12   DVT prophylaxis: lovenox  HPI/Subjective: Pt is alert and oreinted.  She feels "better" but "not back to normal yet."  C/o frequent watery stools.  Denies f/c, n/v, or abdom pain.  C/O pain and swelling in R LE.     Objective: Blood pressure 130/72, pulse 95, temperature 97.9 F (36.6 C), temperature source Oral, resp. rate 26, height 5\' 5"  (1.651 m), weight 88.3 kg (194 lb 10.7 oz), last menstrual period  04/29/2012, SpO2 97.00%.  Intake/Output Summary (Last 24 hours) at 04/30/12 1635 Last data filed at 04/30/12 1400  Gross per 24 hour  Intake   1173 ml  Output      0 ml  Net   1173 ml     Exam: General: No acute respiratory distress at rest, but is requiring [place] O2 Lungs: fine diffuse crackles w/o wheeze Cardiovascular: Regular rate and rhythm without murmur gallop or rub normal S1 and S2 Abdomen: Nontender, nondistended, soft, bowel sounds positive, no rebound, no ascites, no appreciable mass Extremities: No significant cyanosis or  clubbing;  Trace edema bilateral lower extremities  Data Reviewed: Basic Metabolic Panel:  Lab 04/30/12 4540 04/29/12 0432 04/28/12 0330 04/27/12 0455 04/26/12 0416  NA 137 141 141 143 146*  K 3.4* 3.8 2.9* 3.8 3.5  CL 101 103 103 104 111  CO2 25 26 27 31 29   GLUCOSE 141* 243* 174* 279* 256*  BUN 8 9 13 13 11   CREATININE 0.57 0.55 0.61 0.55 0.52  CALCIUM 8.6 8.7 9.3 9.4 8.9  MG 1.9 -- -- -- --  PHOS 4.0 -- -- -- --   CBC:  Lab 04/30/12 0652 04/28/12 0330 04/26/12 0416 04/24/12 0530  WBC 10.6* 12.5* 11.8* 6.4  NEUTROABS -- -- -- --  HGB 9.3* 9.1* 8.3* 8.7*  HCT 28.0* 27.6* 25.6* 26.1*  MCV 83.1 82.4 84.8 84.2  PLT 409* 367 261 169   CBG:  Lab 04/30/12 1229 04/30/12 0749 04/29/12 2140 04/29/12 1710 04/29/12 1147  GLUCAP 225* 148* 136* 304* 297*    Recent Results (from the past 240 hour(s))  CULTURE, RESPIRATORY     Status: Normal   Collection Time   04/21/12  3:30 AM      Component Value Range Status Comment   Specimen Description TRACHEAL ASPIRATE   Final    Special Requests NONE   Final    Gram Stain     Final    Value: RARE WBC PRESENT, PREDOMINANTLY PMN     NO SQUAMOUS EPITHELIAL CELLS SEEN     RARE GRAM POSITIVE COCCI     IN PAIRS   Culture Non-Pathogenic Oropharyngeal-type Flora Isolated.   Final    Report Status 04/23/2012 FINAL   Final   CULTURE, BLOOD (ROUTINE X 2)     Status: Normal   Collection Time   04/21/12  9:30 AM      Component Value Range Status Comment   Specimen Description BLOOD RIGHT ARM   Final    Special Requests BOTTLES DRAWN AEROBIC AND ANAEROBIC 10CC   Final    Culture  Setup Time 04/21/2012 14:18   Final    Culture NO GROWTH 5 DAYS   Final    Report Status 04/27/2012 FINAL   Final   CULTURE, BLOOD (ROUTINE X 2)     Status: Normal   Collection Time   04/21/12  9:40 AM      Component Value Range Status Comment   Specimen Description BLOOD RIGHT HAND   Final    Special Requests BOTTLES DRAWN AEROBIC AND ANAEROBIC 10CC   Final    Culture   Setup Time 04/21/2012 14:18   Final    Culture NO GROWTH 5 DAYS   Final    Report Status 04/27/2012 FINAL   Final   RESPIRATORY VIRUS PANEL     Status: Abnormal   Collection Time   04/21/12 10:00 AM      Component Value Range Status Comment   Source -  RVPAN TRACHEAL ASPIRATE   Final    Respiratory Syncytial Virus A NOT DETECTED   Final    Respiratory Syncytial Virus B NOT DETECTED   Final    Influenza A NOT DETECTED   Final    Influenza B NOT DETECTED   Final    Parainfluenza 1 NOT DETECTED   Final    Parainfluenza 2 NOT DETECTED   Final    Parainfluenza 3 NOT DETECTED   Final    Metapneumovirus DETECTED (*)  Final    Rhinovirus NOT DETECTED   Final    Adenovirus NOT DETECTED   Final    Influenza A H1 NOT DETECTED   Final    Influenza A H3 NOT DETECTED   Final      Studies:  Recent x-ray studies have been reviewed in detail by the Attending Physician  Scheduled Meds:  Reviewed in detail by the Attending Physician   Lonia Blood, MD [facility] Office  [phone] Pager [phone]  On-Call/Text Page:      [email or web address]      password [email or web address]  If 7PM-7AM, please contact night-coverage [email or web address] Password [facility] 04/30/2012, 4:35 PM   LOS: 10 days

## 2012-04-30 NOTE — Progress Notes (Signed)
Clinical Social Work Department CLINICAL SOCIAL WORK PSYCHIATRY SERVICE LINE ASSESSMENT 04/30/2012  Patient:  [name]  Account:  [id number]  Admit Date:  04/20/2012  Clinical Social Worker:  Unk Lightning, LCSW  Date/Time:  04/30/2012 03:00 PM Referred by:  Physician  Date referred:  04/30/2012 Reason for Referral  Behavioral Health Issues   Presenting Symptoms/Problems (In the person's/family's own words):   Psych consulted for SI   Abuse/Neglect/Trauma History (check all that apply)  Denies history   Abuse/Neglect/Trauma Comments:   Psychiatric History (check all that apply)  Outpatient treatment  Inpatient/hospitilization   Psychiatric medications:  Depakote, Geodon   Current Mental Health Hospitalizations/Previous Mental Health History:   Patient reports she was diagnosed with Bipolar about 16 years ago.   Current provider:   [facility]   Place and Date:   [facility], [place]   Current Medications:   acetaminophen (TYLENOL) oral liquid 160 mg/5 mL, albuterol, fentaNYL, guaiFENesin, hydrALAZINE, metoprolol, ondansetron (ZOFRAN) IV, phenol                        . albuterol  2.5 mg Nebulization Q6H  . antiseptic oral rinse  15 mL Mouth Rinse BID  . divalproex  500 mg Oral QHS  . divalproex  250 mg Oral Daily  . enoxaparin (LOVENOX) injection  40 mg Subcutaneous Q24H  . feeding supplement  237 mL Oral BID BM  . gabapentin  200 mg Oral QHS  . insulin aspart  0-15 Units Subcutaneous TID WC  . insulin aspart  0-5 Units Subcutaneous QHS  . insulin aspart  4 Units Subcutaneous TID WC  . insulin glargine  10 Units Subcutaneous QHS  . ipratropium  0.5 mg Nebulization Q6H  . metFORMIN  1,000 mg Oral BID WC  . metoprolol tartrate  50 mg Oral BID  . pantoprazole  40 mg Oral Daily  . sodium chloride  3 mL Intravenous Q12H  . traZODone  100 mg Oral QHS  . ziprasidone  80 mg Oral QHS   Previous Impatient Admission/Date/Reason:   Patient admitted to [facility] last year for  suicide attempt.  Patient has been hospitalized several times in other states. (Mom reports hospitalization about once every 2-3 years)   Emotional Health / Current Symptoms    Suicide/Self Harm  Suicidal ideation (ex: "I can't take any more,I wish I could disappear")   Suicide attempt in the past:   Patient reports she attempted suicide last year but taking pills, cutting herself and drinking bleach. Patient reports she did not have some SI this week. When asked about any current SI patient reports, "no, not really." Patient reports no current plan.   Other harmful behavior:   None reported   Psychotic/Dissociative Symptoms  None reported   Other Psychotic/Dissociative Symptoms:    Attention/Behavioral Symptoms  Within Normal Limits   Other Attention / Behavioral Symptoms:    Cognitive Impairment  Within Normal Limits   Other Cognitive Impairment:    Mood and Adjustment  Mood Congruent    Stress, Anxiety, Trauma, Any Recent Loss/Stressor  None reported   Anxiety (frequency):   Phobia (specify):   Compulsive behavior (specify):   Obsessive behavior (specify):   Other:   Substance Abuse/Use  None   SBIRT completed (please refer for detailed history):  N  Self-reported substance use:   Patient denies any substance use.   Urinary Drug Screen Completed:  N Alcohol level:    Environmental/Housing/Living Arrangement  Stable housing  Who is in the home:   Alone   Emergency contact:  Rachel-mom   Financial  Medicare  Medicaid   Patient's Strengths and Goals (patient's own words):   Patient reports strong relationship with family and strong support from church.   Clinical Social Worker's Interpretive Summary:   CSW received referral due to patient having SI. CSW reviewed chart and met with patient and mom at bedside. Patient agreeable to mom involvement during assessment.    CSW introduced myself and explained role. Patient reports she was transferred  from [facility] to [facility]. Patient reports that she has pneumonia and was intubated and sedated for about a week. Patient reports during this time she did not receive any of her psychotropic medications. Patient feels emotional wellbeing is now compromised from not having medications.    Patient lives in her grandmother's house. Her mom does not live with her but often stays there. Patient also has a son that stays with her often. Patient has lived in other states but moved to [place] to be closer to family. Patient reports that she was diagnosed with bipolar about 16 years ago. Patient reports she has always been compliant with medications and has always followed up with MH appointments. Patient currently attends medication management and therapy at [facility]. Patient reports that she does not have a plan and does not have SI at this time but just feels depressed. Mom contributes depression to not having medication and being sedated. Patient agrees that she feels isolates and struggles with feeling weak and unable to perform ADL.    Patient denies any substance use and mom confirms. Patient reports that she feels that if she starts taking medication again and can get back home into her normal routine then she will feel better. Patient speaks of her church and the benefit that she receives from being involved in her faith. Mom and patient agree that patient would do well to return home with follow up at [facility]. CSW explained that psych MD would visit patient and would make recommendations for disposition.    Patient was appropriate during the assessment and highly engaged. Patient appeared open and honest regarding her MH history and previous hospitalizations. Patient and mom report no concern for patient's safety at this time. Patient denies any psychotic symptoms. Patient able to identify triggers and able to identify coping skills used in the past that were effective.    CSW will continue  to follow and will assist with any recommendations provided by psych MD.   Disposition:  Recommend Psych CSW continuing to support while in hospital

## 2012-05-01 ENCOUNTER — Inpatient Hospital Stay (HOSPITAL_COMMUNITY): Payer: Medicare Other

## 2012-05-01 DIAGNOSIS — F329 Major depressive disorder, single episode, unspecified: Secondary | ICD-10-CM

## 2012-05-01 DIAGNOSIS — M7989 Other specified soft tissue disorders: Secondary | ICD-10-CM

## 2012-05-01 DIAGNOSIS — M79609 Pain in unspecified limb: Secondary | ICD-10-CM

## 2012-05-01 LAB — GLUCOSE, CAPILLARY
Glucose-Capillary: 259 mg/dL — ABNORMAL HIGH (ref 70–99)
Glucose-Capillary: 106 mg/dL — ABNORMAL HIGH (ref 70–99)

## 2012-05-01 NOTE — Progress Notes (Signed)
Tried to call report again to [phone], rn couldn't take report, but the charge nurse took report.------Shacora Zynda, rn

## 2012-05-01 NOTE — Progress Notes (Signed)
Pt has been transferred off the floor to [id number].-------Taisa Deloria, rn

## 2012-05-01 NOTE — Progress Notes (Signed)
[facility] Progress Note [place] TEAM 1 - Stepdown/ICU TEAM   [name]:[id number] DOB: 07-25-1965 DOA: 04/20/2012 PCP: Stacy Gardner, MD  Brief narrative: 47 y.o. female with a past medical history of bipolar disorder, diabetes, hypertension, tobacco abuse, who was in her usual state of health till 2 days prior to admission when she started having a cough, which was dry. She subsequently started getting fever, although she did not check her temperature. This was followed by onset of vomiting. She reported that her son was sick with a cough and cold symptoms a few days. She had been wheezing at home. She came in to the emergency department, was diagnosed with a pneumonia, and was treated with antibiotics and was discharged home with antibiotics. However, the patient's symptoms continued to get worse. She got more and more short of breath and so decided to come back in to the hospital.   She was admitted to [facility] by [facility] on 04/20/2012 and tx was begun for suspected CAP.  She quickly deteriorated from a respiratory standpoint, and ultimately required intubation on 04/20/2012 around 11pm.  She was transferred to [facility] on 04/21/2012, and her care was assumed by PCCM.  She was diagnosed w/ HMPV pneumonia and ARDS.  She was able to be successfully extubated on 04/27/2013.    On 04/28/2012, while still in the ICU, she related to her RN that she wanted to kill herself and was tired of suffering.  As a result Psych was consulted, and she was placed on suicidal precautions.  [facility] has assumed care of the pt again as of 04/30/2012/  Assessment/Plan:  CAP/ARDS due to human metapneumovirus pneumonia  Able to wean O2 down- will likely d/c home in AM- will obtain ambulatory pulse ox- transfer to tele- will go home from here  DIABETES MELLITUS, TYPE II  CHO modified diet - Lantus + meal coverage + SSI  Diastolic heart failure  Keep I/Os even to negative as permitted by BP and renal function     HYPERTENSION  Well controlled at present   Sinus Tachy  Appears to be controlled at this time - likely driven by respiratory distress  Bipolar 1 disorder  -Psych following for SI - suicide precautions/sitter - take Geodon BID at home- currently on daily -Depakote and Trazodone resumed - will recall psych to evaluate to d/c sitter- Psych has not been back to see since 14th  PAH (pulmonary artery hypertension)  Likely due to acute pulmonary process - no further evaluation indicated at this time   Hypokalemia  Likely due to GI loss - replace and follow   TOBACCO ABUSE  Counseled to quit   Code Status: FULL   Consultants: PCCM  LINES / TUBES:  ETT 01/05 >> 1/12  L [place] CVC 1/6>>  A-line 1/07 >> out   CULTURES:  Respiratory culture 01/06 >> Flu negative, positive for human metapneumovirus  Influenza panel 01/05 >> NEGATIVE  Urine strep 01/05 >> NEGATIVE  Urine legionella 01/05 >>> NEGATIVE  Blood cx x2 1/6>>> NEGATIVE   ANTIBIOTICS:  Levaquin 01/04 >> 01/06  Vancomycin 01/06 >> 1/09  Primaxin 01/06 >> 1/09  azithro 1/6 >> 1/09  Levoflox 1/09 >> 1/12   DVT prophylaxis: lovenox  HPI/Subjective: diarrhea has resolved- gaining more strenght   Objective: Blood pressure 137/91, pulse 79, temperature 98.6 F (37 C), temperature source Oral, resp. rate 16, height 5\' 5"  (1.651 m), weight 88.3 kg (194 lb 10.7 oz), last menstrual period 04/29/2012, SpO2 98.00%.  Intake/Output Summary (Last 24 hours) at 05/01/12 1556 Last data filed at 05/01/12 1300  Gross per 24 hour  Intake   1040 ml  Output      0 ml  Net   1040 ml     Exam: General: alert, sitting in chair, No acute respiratory distress at rest Lungs: CTA b'l good air entry Cardiovascular: Regular rate and rhythm without murmur gallop or rub normal S1 and S2 Abdomen: Nontender, nondistended, soft, bowel sounds positive, no rebound, no ascites, no appreciable mass Extremities: No significant cyanosis or  clubbing;  Trace edema bilateral lower extremities  Data Reviewed: Basic Metabolic Panel:  Lab 04/30/12 1610 04/29/12 0432 04/28/12 0330 04/27/12 0455 04/26/12 0416  NA 137 141 141 143 146*  K 3.4* 3.8 2.9* 3.8 3.5  CL 101 103 103 104 111  CO2 25 26 27 31 29   GLUCOSE 141* 243* 174* 279* 256*  BUN 8 9 13 13 11   CREATININE 0.57 0.55 0.61 0.55 0.52  CALCIUM 8.6 8.7 9.3 9.4 8.9  MG 1.9 -- -- -- --  PHOS 4.0 -- -- -- --   CBC:  Lab 04/30/12 0652 04/28/12 0330 04/26/12 0416  WBC 10.6* 12.5* 11.8*  NEUTROABS -- -- --  HGB 9.3* 9.1* 8.3*  HCT 28.0* 27.6* 25.6*  MCV 83.1 82.4 84.8  PLT 409* 367 261   CBG:  Lab 05/01/12 0813 04/30/12 2124 04/30/12 1809 04/30/12 1229 04/30/12 0749  GLUCAP 146* 130* 182* 225* 148*    Recent Results (from the past 240 hour(s))  CLOSTRIDIUM DIFFICILE BY PCR     Status: Normal   Collection Time   04/30/12  5:46 PM      Component Value Range Status Comment   C difficile by pcr NEGATIVE  NEGATIVE Final      Studies:  Recent x-ray studies have been reviewed in detail by the Attending Physician  Scheduled Meds:  Reviewed in detail by the Attending Physician   Calvert Cantor, MD [phone]  If 7PM-7AM, please contact night-coverage [email or web address] Password [email or web address] 05/01/2012, 3:56 PM   LOS: 11 days

## 2012-05-01 NOTE — Progress Notes (Signed)
Physical Therapy Treatment Patient Details Name: [name] MRN: [id number] DOB: 1966-02-24 Today's Date: 05/01/2012 Time: 2130-8657 PT Time Calculation (min): 25 min  PT Assessment / Plan / Recommendation Comments on Treatment Session  Pt admitted with ARDS, VDRF, HMPV and progressing with mobility. Pt with sats 91-94% on RA with ambulation and remained 91-92% on return to 1L at rest end of session. HR up to 140 with gait and 130 with HEP but remains 110 at rest in chair without activity. Pt encouraged to continue mobility with nursing and states mom and son will stay with her at home on discharge.     Follow Up Recommendations        Does the patient have the potential to tolerate intense rehabilitation     Barriers to Discharge        Equipment Recommendations       Recommendations for Other Services    Frequency     Plan Discharge plan remains appropriate;Frequency remains appropriate    Precautions / Restrictions Precautions Precautions: Fall   Pertinent Vitals/Pain No pain    Mobility  Bed Mobility Bed Mobility: Not assessed Transfers Sit to Stand: 4: Min guard;From bed Stand to Sit: 4: Min guard;To chair/3-in-1 Details for Transfer Assistance: x3 trials as pt repeatedly standing prior to all lines and linens out of the way with cueing for safety, hand placement and awareness of environment Ambulation/Gait Ambulation/Gait Assistance: 4: Min guard Ambulation Distance (Feet): 160 Feet Assistive device: Rolling walker Ambulation/Gait Assistance Details: cueing for posture and position in RW Gait Pattern: Step-through pattern;Decreased stride length;Trunk flexed Gait velocity: decreased Stairs: No    Exercises General Exercises - Lower Extremity Long Arc Quad: AROM;Both;20 reps;Seated Hip ABduction/ADduction: AROM;Both;20 reps;Seated Hip Flexion/Marching: AROM;Both;20 reps;Seated   PT Diagnosis:    PT Problem List:   PT Treatment Interventions:     PT  Goals Acute Rehab PT Goals PT Goal: Sit to Stand - Progress: Progressing toward goal PT Goal: Stand to Sit - Progress: Progressing toward goal PT Goal: Ambulate - Progress: Progressing toward goal  Visit Information  Last PT Received On: 05/01/12 Assistance Needed: +1    Subjective Data  Subjective: I think I'm doing better than the other day   Cognition  Overall Cognitive Status: Impaired Area of Impairment: Safety/judgement Arousal/Alertness: Awake/alert Orientation Level: Appears intact for tasks assessed Behavior During Session: [facility] for tasks performed Safety/Judgement: Impulsive;Decreased safety judgement for tasks assessed    Balance     End of Session PT - End of Session Equipment Utilized During Treatment: Gait belt Activity Tolerance: Patient tolerated treatment well Patient left: in chair;with call bell/phone within reach;Other (comment) (sitter present) Nurse Communication: Mobility status   GP     Delorse Lek 05/01/2012, 8:56 AM Delaney Meigs, PT [phone]

## 2012-05-01 NOTE — Progress Notes (Signed)
[name] is a 47 y.o. female patient who transferred  from 2613 awake, alert  & orientated  X 3, Full Code, VSS - Blood pressure 130/85, pulse 96, temperature 98.6 F (37 C), temperature source Oral, resp. rate 19, height 5\' 5"  (1.651 m), weight 88.3 kg (194 lb 10.7 oz), last menstrual period 04/29/2012, SpO2 94.00%.,R/A, no c/o shortness of breath, no c/o chest pain, no distress noted. :   IV site WDL: hand left, condition patent and no redness with a transparent dsg that's clean dry and intact.  Allergies:   Allergies  Allergen Reactions  . Metronidazole Shortness Of Breath and Swelling  . Orange Itching  . Shrimp (Shellfish Allergy) Shortness Of Breath and Itching    Takes Benadryl before eating shrimp.  Marland Kitchen Penicillins Hives and Swelling    Fever as well  . Sulfonamide Derivatives Hives    fever  . Glipizide Other (See Comments)    psychosis  . Sulfamethoxazole W-Trimethoprim Rash     Past Medical History  Diagnosis Date  . Arteriosclerotic cardiovascular disease (ASCVD)     Minimal at cath in [facility].stress nuclear study in 8/08 with nl EF; neg stress echo in 2010  . Diabetes mellitus, type 2 2000    Onset in 2000; no insulin  . Hyperlipidemia   . Hypertension `    during treatment with Geodon  . Gastroesophageal reflux disease     Schatzki's ring  . Anemia, iron deficiency   . Alcohol abuse   . Depression   . Community acquired pneumonia 01/03/10    2011; with pleural effusion-[facility] acute resp failure  . Obesity   . Community acquired pneumonia 05/2010  . Schizoaffective disorder     requiring multiple psychiatric admissions  . Dysphagia   . Diastolic dysfunction     grade 2 per echo 2011    Pt orientation to unit, room and routine. SR up x 2, fall risk assessment complete with Patient and family verbalizing understanding of risks associated with falls. Pt verbalizes an understanding of how to use the call bell and to call for help before  getting out of bed.  Pt. Transfer with sitter for suicide room checked and telephone and plastic trash bags removed.     Will cont to monitor and assist as needed.  Laural Benes, Wilburta Milbourn C, RN 05/01/2012 7:00 PM

## 2012-05-01 NOTE — Progress Notes (Signed)
VASCULAR LAB PRELIMINARY  PRELIMINARY  PRELIMINARY  PRELIMINARY  Right lower extremity venous duplex completed.    Preliminary report:  Right:  No evidence of DVT, superficial thrombosis, or Baker's cyst.  Deena Shaub, RVT 05/01/2012, 12:21 PM

## 2012-05-01 NOTE — Progress Notes (Signed)
Clinical Social Work Progress Note PSYCHIATRY SERVICE LINE 05/01/2012  Patient:  [name]  Account:  [id number]  Admit Date:  04/20/2012  Clinical Social Worker:  Unk Lightning, LCSW  Date/Time:  05/01/2012 01:45 PM  Review of Patient  Overall Medical Condition:   Patient reports she is feeling better and wants to talk to the doctor about leaving in 1-2 days.   Participation Level:  Active  Participation Quality  Appropriate   Other Participation Quality:   Affect  Appropriate   Cognitive  Appropriate   Reaction to Medications/Concerns:   None reported   Modes of Intervention  Support   Summary of Progress/Plan at Discharge   CSW met with patient at bedside. No visitors present. Patient was sitting in chair and reports she just finished lunch.    Patient reports she is feeling better today and will talk with MD regarding discharging. CSW and patient discussed any symptoms patient was experiencing. Patient reports that she is feeling less depressed today and more hopeful. Patient reports one reason she wants to dc is to be home with family and their support. Patient reports that mom, son and grandson will be staying with her and ensuring her safety.    CSW inquired about any SI or HI. Patient denies any SI or HI and reports, "I really wasn't even having SI yesterday. Just more of an emotional day." Patient reports no plan and reports that she is feeling more emotionally stable. Patient reports that she plans to follow up with outpatient care at dc to ensure she remains stable.    Patient was alert and oriented and appropriate throughout assessment. Patient was engaged and agreeable to answer questions. Patient does not appear to be a harm to herself or others at this time and has reflected on her MI. Patient has already created a plan and is relying on informal supports to assist her during this time.    CSW will continue to follow psych MD recommendations.

## 2012-05-01 NOTE — Consult Note (Signed)
Reason for Consult: SI? Referring Physician: unknown  [name] is an 47 y.o. female.  HPI:   [name] is a 47 y.o., female with a PMHX of DM2 (A1c 8.2), HLD, bipolar disorder, schizoaffective disorder, and alcohol abuse who is admitted to [facility] on 04/21/2012 on transfer from [facility] (admitted on 04/20/2012) with severe CAP in setting of acute respiratory failure requiring intubation on 04/20/2012.   Interval hx:  Seen on 1/15 and today. More calmer and better mood. Her mood has improved after coming here. Thinks her medical issues affecting her mood. Continues to deny SI thoughts today and yesterday. Thinks her current psy meds helps her. Denies any psychosis now. Thinks when she was off psy meds in ED she was unstable.   Past Medical History  Diagnosis Date  . Arteriosclerotic cardiovascular disease (ASCVD)     Minimal at cath in [facility].stress nuclear study in 8/08 with nl EF; neg stress echo in 2010  . Diabetes mellitus, type 2 2000    Onset in 2000; no insulin  . Hyperlipidemia   . Hypertension `    during treatment with Geodon  . Gastroesophageal reflux disease     Schatzki's ring  . Anemia, iron deficiency   . Alcohol abuse   . Depression   . Community acquired pneumonia 01/03/10    2011; with pleural effusion-[facility] acute resp failure  . Obesity   . Community acquired pneumonia 05/2010  . Schizoaffective disorder     requiring multiple psychiatric admissions  . Dysphagia   . Diastolic dysfunction     grade 2 per echo 2011    Past Surgical History  Procedure Date  . Dilation and curettage, diagnostic / therapeutic 1992  . Esophagogastroduodenoscopy 09/16/08    Dr. Ronni Rumble hiatal hernia/excoriations involving the cardia and mucosa consistent with trauma, antral erosions  of linear petechiae ? gastritis versus early gastric antral vascular  ectasia.Marland Kitchen biopsy showed reactive gastropathy. No H. pylori.  .  Esophagogastroduodenoscopy 09/2007    Dr. Rinaldo Ratel ring, dilated to 56 French Maloney dilator, small hiatal hernia, antral erosions, biopsies reactive gastropathy.  Gaspar Bidding dilation 07/17/2011    [facility] sedation-->distal esophageal stricture s/p dilation, chronic gastritis, multiple ulcers in stomach. no h.pylori  . Colonoscopy 01/2006    internal hemorrhoids  . Colonoscopy 01/10/2012    Procedure: COLONOSCOPY;  Surgeon: Corbin Ade, MD;  Location: [facility];  Service: Endoscopy;  Laterality: N/A;  entered cecum @ [phone] ; total cecal withdrawal time = 8 minutes    Family History  Problem Relation Age of Onset  . Colon cancer Other   . Hypertension Mother   . Stroke Father     deceased at age 88  . Heart disease Sister   . Anesthesia problems Neg Hx   . Hypotension Neg Hx   . Malignant hyperthermia Neg Hx   . Pseudochol deficiency Neg Hx     Social History:  reports that she has been smoking Cigarettes.  She has a 45 pack-year smoking history. She does not have any smokeless tobacco history on file. She reports that she does not drink alcohol or use illicit drugs.  Allergies:  Allergies  Allergen Reactions  . Metronidazole Shortness Of Breath and Swelling  . Orange Itching  . Shrimp (Shellfish Allergy) Shortness Of Breath and Itching    Takes Benadryl before eating shrimp.  Marland Kitchen Penicillins Hives and Swelling    Fever as well  . Sulfonamide Derivatives Hives  fever  . Glipizide Other (See Comments)    psychosis  . Sulfamethoxazole W-Trimethoprim Rash    Medications: I have reviewed the patient's current medications.  Results for orders placed during the hospital encounter of 04/20/12 (from the past 48 hour(s))  GLUCOSE, CAPILLARY     Status: Abnormal   Collection Time   04/29/12 11:47 AM      Component Value Range Comment   Glucose-Capillary 297 (*) 70 - 99 mg/dL    Comment 1 Notify RN     GLUCOSE, CAPILLARY     Status: Abnormal   Collection Time   04/29/12   5:10 PM      Component Value Range Comment   Glucose-Capillary 304 (*) 70 - 99 mg/dL    Comment 1 Notify RN     GLUCOSE, CAPILLARY     Status: Abnormal   Collection Time   04/29/12  9:40 PM      Component Value Range Comment   Glucose-Capillary 136 (*) 70 - 99 mg/dL   CBC     Status: Abnormal   Collection Time   04/30/12  6:52 AM      Component Value Range Comment   WBC 10.6 (*) 4.0 - 10.5 K/uL    RBC 3.37 (*) 3.87 - 5.11 MIL/uL    Hemoglobin 9.3 (*) 12.0 - 15.0 g/dL    HCT 95.6 (*) 21.3 - 46.0 %    MCV 83.1  78.0 - 100.0 fL    MCH 27.6  26.0 - 34.0 pg    MCHC 33.2  30.0 - 36.0 g/dL    RDW 08.6 (*) 57.8 - 15.5 %    Platelets 409 (*) 150 - 400 K/uL   BASIC METABOLIC PANEL     Status: Abnormal   Collection Time   04/30/12  6:52 AM      Component Value Range Comment   Sodium 137  135 - 145 mEq/L    Potassium 3.4 (*) 3.5 - 5.1 mEq/L    Chloride 101  96 - 112 mEq/L    CO2 25  19 - 32 mEq/L    Glucose, Bld 141 (*) 70 - 99 mg/dL    BUN 8  6 - 23 mg/dL    Creatinine, Ser 4.69  0.50 - 1.10 mg/dL    Calcium 8.6  8.4 - 62.9 mg/dL    GFR calc non Af Amer >90  >90 mL/min    GFR calc Af Amer >90  >90 mL/min   MAGNESIUM     Status: Normal   Collection Time   04/30/12  6:52 AM      Component Value Range Comment   Magnesium 1.9  1.5 - 2.5 mg/dL   PHOSPHORUS     Status: Normal   Collection Time   04/30/12  6:52 AM      Component Value Range Comment   Phosphorus 4.0  2.3 - 4.6 mg/dL   GLUCOSE, CAPILLARY     Status: Abnormal   Collection Time   04/30/12  7:49 AM      Component Value Range Comment   Glucose-Capillary 148 (*) 70 - 99 mg/dL   GLUCOSE, CAPILLARY     Status: Abnormal   Collection Time   04/30/12 12:29 PM      Component Value Range Comment   Glucose-Capillary 225 (*) 70 - 99 mg/dL   CLOSTRIDIUM DIFFICILE BY PCR     Status: Normal   Collection Time   04/30/12  5:46 PM  Component Value Range Comment   C difficile by pcr NEGATIVE  NEGATIVE   GLUCOSE, CAPILLARY     Status:  Abnormal   Collection Time   04/30/12  6:09 PM      Component Value Range Comment   Glucose-Capillary 182 (*) 70 - 99 mg/dL   GLUCOSE, CAPILLARY     Status: Abnormal   Collection Time   04/30/12  9:24 PM      Component Value Range Comment   Glucose-Capillary 130 (*) 70 - 99 mg/dL   GLUCOSE, CAPILLARY     Status: Abnormal   Collection Time   05/01/12  8:13 AM      Component Value Range Comment   Glucose-Capillary 146 (*) 70 - 99 mg/dL    Comment 1 Notify RN       Dg Chest Port 1 View  04/30/2012  *RADIOLOGY REPORT*  Clinical Data: Cough and respiratory failure.  PORTABLE CHEST - 1 VIEW  Comparison: 04/29/2012  Findings: Borderline heart size with normal pulmonary vascularity. Diffuse parenchymal infiltration throughout the lungs appears slightly improved since previous study.  No focal consolidation. No blunting of costophrenic angles.  No pneumothorax.  IMPRESSION: Bilateral parenchymal infiltrates appear slightly improved since previous study.   Original Report Authenticated By: Burman Nieves, M.D.     Review of Systems  Gastrointestinal: Positive for blood in stool.   Blood pressure 163/98, pulse 140, temperature 97.8 F (36.6 C), temperature source Oral, resp. rate 16, height 5\' 5"  (1.651 m), weight 88.3 kg (194 lb 10.7 oz), last menstrual period 04/29/2012, SpO2 97.00%. Physical Exam   Mental Status Examination/Evaluation:  Appearance: on bed  Eye Contact::good  Speech: normal  Volume: Normal  Mood:ok  Affect: ristricted  Thought Process: organized  Orientation: Full  Thought Content: NO AVH  Suicidal Thoughts: no  Homicidal Thoughts: no  Memory: Recent; Poor  Judgement: Impaired  Insight: Lacking  Psychomotor Activity: Normal  Concentration: fair  Recall: poor  Akathisia: No  Assessment:  AXIS I: Bipolar d/o   AXIS II: Deferred  AXIS III: see emdical hx ? ?  ? ?  ?  ? ?  ?  ? ?  ?  ? ?  ?  ?  AXIS IV: unknown  AXIS V:  50  ?  Treatment Plan/Recommendations:  1. Continue current psy meds. Pt is not interested to change any meds now.  2. Will recommend out pt Psy f/u after medical clearance. Psy SW can help. Pt is willing to go [facility] now. Pt agreed with this plan. Pt also want home health nursing aid.  3. Will continue to follow as needed  Wonda Cerise 05/01/2012, 11:45 AM

## 2012-05-01 NOTE — Progress Notes (Signed)
Notified Craige Cotta, NP that patient's right leg from knee to foot is swollen. Patient requesting xray of right leg due to pain. Patient states that it hurts when resting and especially when going to bathroom. Craige Cotta, NP ordered xray of right knee. Will continue to monitor patient. Nelda Marseille, RN

## 2012-05-01 NOTE — Evaluation (Addendum)
Occupational Therapy Evaluation Patient Details Name: [name] MRN: [id number] DOB: July 10, 1965 Today's Date: 05/01/2012 Time: 1478-2956 OT Time Calculation (min): 21 min  OT Assessment / Plan / Recommendation Clinical Impression  Pt admitted with CAP/ARDS due to human metapneumovirus pneumonia . Will benefit from acute OT services to address below problem list. Recommending HHOT and 24/7 sup/assist at home.    OT Assessment  Patient needs continued OT Services    Follow Up Recommendations  Home health OT;Supervision/Assistance - 24 hour    Barriers to Discharge None    Equipment Recommendations  3 in 1 bedside comode    Recommendations for Other Services    Frequency  Min 2X/week    Precautions / Restrictions Precautions Precautions: Fall   Pertinent Vitals/Pain O2 sats on RA 92-100% on RA. HR reached 120 during grooming at sink at which time pt c/o dizziness, and pt returned to chair HR returned to 90-100.  BP stable throughout.    ADL  Eating/Feeding: Performed;Independent Where Assessed - Eating/Feeding: Chair Grooming: Performed;Wash/dry hands;Wash/dry face;Teeth care;Supervision/safety Where Assessed - Grooming: Unsupported standing Lower Body Dressing: Performed;Supervision/safety Where Assessed - Lower Body Dressing: Unsupported sitting Toilet Transfer: Simulated;Min guard Toilet Transfer Method: Sit to Barista: Other (comment) (chair) Toileting - Clothing Manipulation and Hygiene: Performed;Min guard Where Assessed - Toileting Clothing Manipulation and Hygiene: Sit to stand from 3-in-1 or toilet Equipment Used: Gait belt Transfers/Ambulation Related to ADLs: Pt ambulated ~3 feet from chair to sink to perform grooming task. min guard for safety. ADL Comments: Pt able to don/doff bil socks sitting EOC.  While standing at sink pt c/o dizziness (HR at 120).    OT Diagnosis: Generalized weakness  OT Problem List: Decreased  strength;Decreased activity tolerance;Decreased knowledge of use of DME or AE;Decreased safety awareness OT Treatment Interventions: Self-care/ADL training;DME and/or AE instruction;Therapeutic activities;Patient/family education   OT Goals Acute Rehab OT Goals OT Goal Formulation: With patient ADL Goals Pt Will Perform Grooming: with modified independence;Standing at sink ADL Goal: Grooming - Progress: Goal set today Pt Will Transfer to Toilet: with modified independence;Ambulation;with DME;Comfort height toilet ADL Goal: Toilet Transfer - Progress: Goal set today Pt Will Perform Toileting - Clothing Manipulation: with modified independence;Standing ADL Goal: Toileting - Clothing Manipulation - Progress: Goal set today Pt Will Perform Toileting - Hygiene: with modified independence;Standing at 3-in-1/toilet;Sit to stand from 3-in-1/toilet ADL Goal: Toileting - Hygiene - Progress: Goal set today Pt Will Perform Tub/Shower Transfer: Tub transfer;with modified independence;Ambulation;with DME (3n1 as shower chair) Miscellaneous OT Goals Miscellaneous OT Goal #1: Pt will independently verbalize 3 energy conservation techniques to use during ADL tasks. OT Goal: Miscellaneous Goal #1 - Progress: Goal set today  Visit Information  Last OT Received On: 05/01/12 Assistance Needed: +1    Subjective Data  Subjective: "I still feel weak"   Prior Functioning     Home Living Lives With: Alone Available Help at Discharge: Family;Available 24 hours/day (mother is coming to stay with pt) Type of Home: House Home Access: Stairs to enter [employer] of Steps: 3 Entrance Stairs-Rails: Right;Left Home Layout: One level Bathroom Shower/Tub: Forensic scientist: Standard Home Adaptive Equipment: None Prior Function Level of Independence: Independent Able to Take Stairs?: Yes Driving: Yes Vocation: Unemployed Communication Communication: No difficulties          Vision/Perception     Cognition  Overall Cognitive Status: Impaired Area of Impairment: Safety/judgement Arousal/Alertness: Awake/alert Orientation Level: Appears intact for tasks assessed Behavior During Session: [facility] for tasks  performed Safety/Judgement: Impulsive;Decreased safety judgement for tasks assessed    Extremity/Trunk Assessment Right Upper Extremity Assessment RUE ROM/Strength/Tone: [facility] for tasks assessed Left Upper Extremity Assessment LUE ROM/Strength/Tone: WFL for tasks assessed     Mobility Bed Mobility Bed Mobility: Not assessed Transfers Transfers: Sit to Stand;Stand to Sit Sit to Stand: 4: Min guard;From chair/3-in-1;With upper extremity assist;With armrests Stand to Sit: 4: Min guard;To chair/3-in-1;With armrests;With upper extremity assist Details for Transfer Assistance: VC for safe hand placement     Shoulder Instructions     Exercise     Balance Balance Balance Assessed: Yes Dynamic Standing Balance Dynamic Standing - Balance Support: No upper extremity supported;During functional activity Dynamic Standing - Level of Assistance: 5: Stand by assistance;4: Min assist Dynamic Standing - Balance Activities: Lateral lean/weight shifting;Forward lean/weight shifting;Reaching for objects Dynamic Standing - Comments: Close guarding during grooming tasks at sink.   End of Session OT - End of Session Equipment Utilized During Treatment: Gait belt Activity Tolerance: Other (comment) (limited by dizziness) Patient left: in chair;with call bell/phone within reach;with nursing in room (with sitter) Nurse Communication: Mobility status  GO    05/01/2012 Cipriano Mile OTR/L Pager [phone] Office [phone]  Cipriano Mile 05/01/2012, 4:47 PM

## 2012-05-01 NOTE — Progress Notes (Addendum)
Tried to call report to receiving rn on [phone], but she/he was not picking up her phone. Will try again.------Talayah Picardi, rn

## 2012-05-02 ENCOUNTER — Encounter: Payer: Self-pay | Admitting: Internal Medicine

## 2012-05-02 DIAGNOSIS — I272 Pulmonary hypertension, unspecified: Secondary | ICD-10-CM | POA: Insufficient documentation

## 2012-05-02 DIAGNOSIS — J1289 Other viral pneumonia: Secondary | ICD-10-CM

## 2012-05-02 HISTORY — DX: Pulmonary hypertension, unspecified: I27.20

## 2012-05-02 MED ORDER — ACETAMINOPHEN 160 MG/5ML PO SOLN: 650.0000 mg | Freq: Four times a day (QID) | ORAL | Status: DC | PRN

## 2012-05-02 MED ORDER — ALBUTEROL SULFATE HFA 108 (90 BASE) MCG/ACT IN AERS: 2.0000 | INHALATION_SPRAY | Freq: Four times a day (QID) | RESPIRATORY_TRACT | Status: DC | PRN

## 2012-05-02 MED ORDER — GLUCERNA SHAKE PO LIQD: 237.0000 mL | Freq: Two times a day (BID) | ORAL | Status: DC

## 2012-05-02 MED ORDER — GUAIFENESIN ER 600 MG PO TB12: 600.0000 mg | ORAL_TABLET | Freq: Two times a day (BID) | ORAL | Status: DC | PRN

## 2012-05-02 NOTE — Care Management Note (Signed)
    Page 1 of 2   05/02/2012     12:26:04 PM   CARE MANAGEMENT NOTE 05/02/2012  Patient:  [name]   Account Number:  [id number]  Date Initiated:  04/24/2012  Documentation initiated by:  Carlyle Lipa  Subjective/Objective Assessment:   severe CAP, VDRF     Action/Plan:   await improvement to know if pt will require trach; SNF vs home vs LTAC   Anticipated DC Date:  05/02/2012   Anticipated DC Plan:  HOME W HOME HEALTH SERVICES      DC Planning Services  CM consult      [facility] Choice  HOME HEALTH   Choice offered to / List presented to:  C-1 Patient   DME arranged  BEDSIDE COMMODE  WALKER - ROLLING      DME agency  [facility]     HH arranged  HH-2 PT  HH-3 OT  HH-4 NURSE'S AIDE      HH agency  [facility]   Status of service:  Completed, signed off Medicare Important Message given?   (If response is "NO", the following Medicare IM given date fields will be blank) Date Medicare IM given:   Date Additional Medicare IM given:    Discharge Disposition:  HOME W HOME HEALTH SERVICES  Per UR Regulation:  Reviewed for med. necessity/level of care/duration of stay  If discussed at Long Length of Stay Meetings, dates discussed:    Comments:  05/02/12 12:13 Letha Cape RN, BSN [phone] patient lives alone, patient is for dc today, will need hhrn, pt, ot and aide with rolling walker and oxygen and bsc.  RN states she will do an oxygen check to make sure if patient needs oxygen.  Patient chose [facility] from list, referral made to [facility], Lupita Leash notified for hhpt/ot and aide. Soc will begin 24-48 hrs post discharge.

## 2012-05-02 NOTE — Discharge Summary (Signed)
Physician Discharge Summary  [name] YNW:[id number] DOB: 1965/07/27 DOA: 04/20/2012  PCP: Stacy Gardner, MD  Admit date: 04/20/2012 Discharge date: 05/02/2012  Time spent: 50 minutes  ** REPEAT ECHO IN 2 MONTHS FOR PULM HTN- see below  Discharge Diagnoses:  Principal Problem:  *ARDS, Human metapneumovirus pneumonia- Active Problems:   CAP (community acquired pneumonia)  Bipolar 1 disorder  Hypokalemia  PAH (pulmonary artery hypertension)  DIABETES MELLITUS, TYPE II  TOBACCO ABUSE  HYPERTENSION  Diastolic heart failure  Discharge Condition: stable  Diet recommendation: diabetic and heart healthy  Filed Weights   04/20/12 1137 04/22/12 0200 04/28/12 0500  Weight: 86.7 kg (191 lb 2.2 oz) 90.4 kg (199 lb 4.7 oz) 88.3 kg (194 lb 10.7 oz)    History of present illness:  47 y.o. female with a past medical history of bipolar disorder, diabetes, hypertension, tobacco abuse, who was in her usual state of health till 2 days prior to admission when she started having a cough, which was dry. She subsequently started getting fever, although she did not check her temperature. This was followed by onset of vomiting. She reported that her son was sick with a cough and cold symptoms a few days. She had been wheezing at home. She came in to the emergency department, was diagnosed with a pneumonia, and was treated with antibiotics and was discharged home with antibiotics. However, the patient's symptoms continued to get worse. She got more and more short of breath and so decided to come back in to the hospital.  She was admitted to [facility] by [facility] on 04/20/2012 and tx was begun for suspected CAP. She quickly deteriorated from a respiratory standpoint, and ultimately required intubation on 04/20/2012 around 11pm.  She was transferred to [facility] on 04/21/2012, and her care was assumed by PCCM. She was diagnosed w/ HMPV pneumonia and ARDS. She was able to be successfully extubated on 04/27/2013.  On  04/28/2012, while still in the ICU, she related to her RN that she wanted to kill herself and was tired of suffering. As a result Psych was consulted, and she was placed on suicidal precautions.  [facility] assumed care of the pt again as of 04/30/2012/   Hospital Course:  CAP/ARDS due to human metapneumovirus pneumonia  She required intubation from 1/5-1/12 and Bipap for 1-2 days She has now been weaned off of O2- she ambulated without O2 yesterday and was able to maintain a pulse ox in the 90s However, she has ronchi and occasional wheeze- will need an albuterol inhaler on discharge.  - she was watched ambulating again today and has no trouble with breathing when exerting herself  DIABETES MELLITUS, TYPE II  She can continue her Lantus- she takes 10-14 U at bedtime- she can continue Lovenox with meals  Diastolic heart failure - Gr 2 on echo in 9/11- Maintain adequate BP control as outpt   Pulmonary HTN- moderate to severe on echo on 04/21/12 Possible from acute resp problems during this hospital stay- recommend repeating ECHO in 2 months  HYPERTENSION  Well controlled at present   Bipolar 1 disorder  Psych evaluated her on 1/14 and again on 1/16 - she is not suicidal and more stable since she made the comment about wanting to kill herself- she is stable on her home meds which she is advised to continue- she can f/u with oupt psych at [facility]  Tobacco abuse She states she will quit smoking- she was counseled extensively  Deconditioning She is quite  weak and is requiring a walker for assistance.  Will go home with home health PT/ OT and Aids to assist her- her mother will be staying with her.   Left leg pain Pain resolved- xray right knee normal    Procedures: ETT 01/05 >> 1/12  L [place] CVC 1/6>>  A-line 1/07 >> out   Consultations:  ID  Psych  Discharge Exam: Filed Vitals:   05/01/12 1559 05/01/12 1846 05/01/12 2159 05/02/12 0540  BP: 131/82 130/85 133/85 160/86    Pulse: 94 96 89 102  Temp: 98.3 F (36.8 C) 98.6 F (37 C) 99.6 F (37.6 C) 99 F (37.2 C)  TempSrc: Oral Oral Oral Oral  Resp: 20 19 20 20   Height:      Weight:      SpO2: 100% 94% 96% 91%    General: alert, no acute distress, sitting up on side of bed Cardiovascular: RRR, no murmurs Respiratory:  Mild ronchi b/l, no crackles or wheeze Abd: soft, NT, ND, BS+ Ext: no c/c/e   Discharge Instructions      Discharge Orders    Future Orders Please Complete By Expires   Walker rolling      Home Health      Questions: Responses:   To provide the following care/treatments PT    OT    Home Health Aide   Face-to-face encounter      Comments:   I [facility] certify that this patient is under my care and that I, or a nurse practitioner or physician's assistant working with me, had a face-to-face encounter that meets the physician face-to-face encounter requirements with this patient on 05/02/2012. The encounter with the patient was in whole, or in part for the following medical condition(s) which is the primary reason for home health care (List medical condition): viral pneumonia   Questions: Responses:   The encounter with the patient was in whole, or in part, for the following medical condition, which is the primary reason for home health care viral pneumonia   I certify that, based on my findings, the following services are medically necessary home health services Physical therapy   My clinical findings support the need for the above services Unable to leave home safely without assistance and/or assistive device   Further, I certify that my clinical findings support that this patient is homebound due to: Ambulates short distances less than 300 feet   To provide the following care/treatments PT    OT    Home Health Aide       Medication List     As of 05/02/2012 12:17 PM    STOP taking these medications         levofloxacin 500 MG tablet   Commonly known as: LEVAQUIN       TAKE these medications         acetaminophen 160 MG/5ML solution   Commonly known as: TYLENOL   Take 20.3 mLs (650 mg total) by mouth every 6 (six) hours as needed for fever or pain (alternative to suppository).      albuterol 108 (90 BASE) MCG/ACT inhaler   Commonly known as: PROVENTIL HFA;VENTOLIN HFA   Inhale 2 puffs into the lungs every 4 (four) hours as needed for wheezing.      albuterol 108 (90 BASE) MCG/ACT inhaler   Commonly known as: PROVENTIL HFA;VENTOLIN HFA   Inhale 2 puffs into the lungs every 6 (six) hours as needed for wheezing or shortness of breath.  cetirizine 10 MG tablet   Commonly known as: ZYRTEC   Take 1 tablet (10 mg total) by mouth at bedtime.      divalproex 500 MG 24 hr tablet   Commonly known as: DEPAKOTE ER   Take 1 tablet (500 mg total) by mouth at bedtime.      divalproex 250 MG DR tablet   Commonly known as: DEPAKOTE   Take 1 tablet (250 mg total) by mouth every morning.      feeding supplement Liqd   Take 237 mLs by mouth 2 (two) times daily between meals.      ferrous gluconate 240 (27 FE) MG tablet   Commonly known as: FERGON   Take 1 tablet (240 mg total) by mouth 2 (two) times daily.      gabapentin 300 MG capsule   Commonly known as: NEURONTIN   Take 1 capsule (300 mg total) by mouth 3 (three) times daily as needed (anxiety).      guaiFENesin 600 MG 12 hr tablet   Commonly known as: MUCINEX   Take 1 tablet (600 mg total) by mouth 2 (two) times daily as needed.      hydrochlorothiazide 25 MG tablet   Commonly known as: HYDRODIURIL   Take 1 tablet (25 mg total) by mouth daily.      HYDROcodone-homatropine 5-1.5 MG/5ML syrup   Commonly known as: HYCODAN   Take 5 mLs by mouth every 6 (six) hours as needed for cough.      hydrOXYzine 50 MG tablet   Commonly known as: ATARAX/VISTARIL   Take 1 tablet (50 mg total) by mouth 3 (three) times daily as needed for anxiety.      ibuprofen 200 MG tablet   Commonly known as:  ADVIL,MOTRIN   Take 400 mg by mouth every 8 (eight) hours as needed. Aches and pains, fever      insulin glargine 100 UNIT/ML injection   Commonly known as: LANTUS   Inject 10 Units into the skin at bedtime.      lovastatin 20 MG tablet   Commonly known as: MEVACOR   Take 1 tablet (20 mg total) by mouth at bedtime.      metFORMIN 1000 MG tablet   Commonly known as: GLUCOPHAGE   Take 1 tablet (1,000 mg total) by mouth 2 (two) times daily with a meal.      metoprolol 50 MG tablet   Commonly known as: LOPRESSOR   Take 1 tablet (50 mg total) by mouth 2 (two) times daily.      mirtazapine 15 MG tablet   Commonly known as: REMERON   Take 1 tablet (15 mg total) by mouth at bedtime.      mometasone 50 MCG/ACT nasal spray   Commonly known as: NASONEX   2 sprays in each nostril daily      pantoprazole 40 MG tablet   Commonly known as: PROTONIX   Take 1 tablet (40 mg total) by mouth 2 (two) times daily.      traZODone 100 MG tablet   Commonly known as: DESYREL   Take 100 mg by mouth at bedtime.      ziprasidone 80 MG capsule   Commonly known as: GEODON   Take 1 capsule (80 mg total) by mouth 2 (two) times daily with a meal.           The results of significant diagnostics from this hospitalization (including imaging, microbiology, ancillary and laboratory) are listed below for reference.  Significant Diagnostic Studies: Dg Chest 2 View  04/29/2012  *RADIOLOGY REPORT*  Clinical Data: Shortness of breath and follow up infiltrates.  CHEST - 2 VIEW  Comparison: 04/28/2012  Findings: Two views of the chest were obtained.  There are patchy parenchymal densities throughout both lungs.  The parenchymal disease may have slightly progressed in the left upper lung.  The left subclavian central line has been removed.  Heart size is stable.  No evidence for a pneumothorax.  No evidence for effusions.  IMPRESSION: Patchy parenchymal densities in the lungs could represent edema or atypical  infection.  There may be slight progression of disease in the left upper lung.   Original Report Authenticated By: Richarda Overlie, M.D.    Dg Chest 2 View  04/19/2012  *RADIOLOGY REPORT*  Clinical Data: Cough and shortness of breath.  CHEST - 2 VIEW  Comparison: 03/09/2012  Findings: New right lower lobe airspace disease is seen, consistent with pneumonia.  Left lung remains clear.  No evidence of pleural effusion.  Heart size mediastinal contours are within normal limits.  IMPRESSION: Right lower lobe airspace disease, consistent with pneumonia.  Post- treatment  radiographic followup recommended to confirm resolution.   Original Report Authenticated By: Myles Rosenthal, M.D.    Dg Knee 1-2 Views Right  05/01/2012  *RADIOLOGY REPORT*  Clinical Data: Right leg pain.  No known injury.  RIGHT KNEE - 1-2 VIEW  Comparison: 05/04/2010  Findings: The right knee appears intact. No evidence of acute fracture or subluxation.  No focal bone lesions.  Bone matrix and cortex appear intact.  No abnormal radiopaque densities in the soft tissues.  No significant effusion.  No significant change since previous study.  IMPRESSION: No acute bony abnormalities.   Original Report Authenticated By: Burman Nieves, M.D.    Dg Chest Port 1 View  04/30/2012  *RADIOLOGY REPORT*  Clinical Data: Cough and respiratory failure.  PORTABLE CHEST - 1 VIEW  Comparison: 04/29/2012  Findings: Borderline heart size with normal pulmonary vascularity. Diffuse parenchymal infiltration throughout the lungs appears slightly improved since previous study.  No focal consolidation. No blunting of costophrenic angles.  No pneumothorax.  IMPRESSION: Bilateral parenchymal infiltrates appear slightly improved since previous study.   Original Report Authenticated By: Burman Nieves, M.D.    Dg Chest Port 1 View  04/28/2012  *RADIOLOGY REPORT*  Clinical Data: Follow-up respiratory failure.  PORTABLE CHEST - 1 VIEW  Comparison: Most recent 04/27/2012  Findings:  Endotracheal tube has been removed.  A central venous catheter tip lies at the cavoatrial junction, unchanged. Borderline cardiomegaly.  Worsening bilateral edema, greater on the right.  No effusion or pneumothorax.  Osseous structures unremarkable.  IMPRESSION: Worsening pulmonary edema.  Endotracheal tube removed.   Original Report Authenticated By: Davonna Belling, M.D.    Dg Chest Port 1 View  04/27/2012  *RADIOLOGY REPORT*  Clinical Data: Respiratory failure.  PORTABLE CHEST - 1 VIEW  Comparison: 04/26/2012  Findings: Support devices are unchanged.  Improvement in the bilateral airspace disease, likely improving edema.  Heart is borderline in size.  No visible effusions.  Persistent lower lobe airspace opacities.  IMPRESSION: Improving bilateral airspace disease, likely improving edema.   Original Report Authenticated By: Charlett Nose, M.D.    Dg Chest Port 1 View  04/26/2012  *RADIOLOGY REPORT*  Clinical Data: Follow-up respiratory failure.  PORTABLE CHEST - 1 VIEW  Comparison: 04/25/2012  Findings: Diffuse bilateral airspace disease again noted, not significantly changed.  Heart is upper limits normal in size. Support  devices are in stable and satisfactory position.  No visible effusions or acute bony abnormality.  IMPRESSION: Stable diffuse bilateral airspace disease.   Original Report Authenticated By: Charlett Nose, M.D.    Dg Chest Port 1 View  04/25/2012  *RADIOLOGY REPORT*  Clinical Data: Respiratory failure  PORTABLE CHEST - 1 VIEW  Comparison: 04/24/2012  Findings: Endotracheal tube tip 3 cm above the carina.  Nasogastric tube enters the stomach.  Left subclavian line tip:  Lower SVC.  Low lung volumes noted with bilateral interstitial opacities as well as left greater than right basilar airspace opacities. Indistinct right perihilar airspace opacity.  Accounting for the low lung volumes, there is borderline cardiomegaly.  IMPRESSION:  1.  Mild increase in left basilar and right perihilar airspace  opacities. 2.  Support apparatus satisfactorily positioned.   Original Report Authenticated By: Gaylyn Rong, M.D.    Dg Chest Port 1 View  04/24/2012  *RADIOLOGY REPORT*  Clinical Data: Respiratory failure.  PORTABLE CHEST - 1 VIEW  Comparison: 04/23/2012  Findings: Endotracheal tube tip approximately 4.5 cm above the carina.  Central line positioning stable.  Airspace disease pattern has improved since the prior study with residual infiltrates and airspace disease remaining, primarily in the lower lung zones bilaterally.  No significant pleural fluid is identified.  Heart size is stable.  IMPRESSION: Improving airspace disease with residual infiltrates remaining.   Original Report Authenticated By: Irish Lack, M.D.    Dg Chest Port 1 View  04/23/2012  *RADIOLOGY REPORT*  Clinical Data: Evaluate lines and endotracheal tube.  PORTABLE CHEST - 1 VIEW  Comparison: On 07/14.  Findings: Endotracheal tube is in satisfactory position.  Left subclavian central line tip projects over the SVC.  Nasogastric tube is followed into the stomach.  Heart size normal.  Slight interval improvement in lung expansion and diffuse bilateral air space disease.  No definite pleural fluid.  IMPRESSION: Moderate diffuse bilateral air space disease, with slight interval improvement from 04/22/2012.   Original Report Authenticated By: Leanna Battles, M.D.    Dg Chest Port 1 View  04/22/2012  *RADIOLOGY REPORT*  Clinical Data: Respiratory distress.  PORTABLE CHEST - 1 VIEW  Comparison: Chest 04/21/2012.  Findings: Support tubes and lines are unchanged.  Diffuse bilateral airspace disease persists without marked change.  No pneumothorax is identified.  Heart size is mildly enlarged.  IMPRESSION: No change in diffuse bilateral airspace disease.   Original Report Authenticated By: Holley Dexter, M.D.    Dg Chest Port 1 View  04/21/2012  *RADIOLOGY REPORT*  Clinical Data: Central line placement.  PORTABLE CHEST - 1 VIEW   Comparison: Chest 04/21/2012 at 12:38  Findings: New left subclavian catheter tip projects over the lower superior vena cava.  No pneumothorax is identified.  Endotracheal tube and NG tube are again seen.  Extensive bilateral airspace disease persist.  IMPRESSION: Tip of left subclavian catheter projects over the lower superior vena cava.  No pneumothorax other change.   Original Report Authenticated By: Holley Dexter, M.D.    Dg Chest Port 1 View  04/21/2012  *RADIOLOGY REPORT*  Clinical Data: Follow-up respiratory failure.  PORTABLE CHEST - 1 VIEW  Comparison: 04/20/2012  Findings:   ET tube now 5 cm above the carina.  Bilateral airspace opacities appear  similar with slightly reduced lung volumes. Nasogastric tube in the stomach again noted. Bilateral pneumonia versus pulmonary edema remain the differential considerations.  IMPRESSION: No significant change aeration.   Original Report Authenticated By: Davonna Belling, M.D.  Dg Chest Port 1 View  04/20/2012  *RADIOLOGY REPORT*  Clinical Data: Hypoxia  PORTABLE CHEST - 1 VIEW  Comparison: 04/19/2012.  Findings: ET tube 4 cm above carina.  Bilateral airspace disease is considerably worse affecting the upper, mid, and lower lung zones. Findings are consistent with rapidly progressive pneumonia although pulmonary edema not excluded.   No effusion or pneumothorax. Normal cardiac size.  IMPRESSION: Bilateral airspace disease has considerably progressed since yesterday's film.  Progressive pneumonia not excluded versus pulmonary edema.  ET tube good position 4 cm above carina.   Original Report Authenticated By: Davonna Belling, M.D.     Microbiology: Recent Results (from the past 240 hour(s))  CLOSTRIDIUM DIFFICILE BY PCR     Status: Normal   Collection Time   04/30/12  5:46 PM      Component Value Range Status Comment   C difficile by pcr NEGATIVE  NEGATIVE Final      Labs: Basic Metabolic Panel:  Lab 04/30/12 1610 04/29/12 0432 04/28/12 0330 04/27/12  0455 04/26/12 0416  NA 137 141 141 143 146*  K 3.4* 3.8 2.9* 3.8 3.5  CL 101 103 103 104 111  CO2 25 26 27 31 29   GLUCOSE 141* 243* 174* 279* 256*  BUN 8 9 13 13 11   CREATININE 0.57 0.55 0.61 0.55 0.52  CALCIUM 8.6 8.7 9.3 9.4 8.9  MG 1.9 -- -- -- --  PHOS 4.0 -- -- -- --   Liver Function Tests: No results found for this basename: AST:5,ALT:5,ALKPHOS:5,BILITOT:5,PROT:5,ALBUMIN:5 in the last 168 hours No results found for this basename: LIPASE:5,AMYLASE:5 in the last 168 hours No results found for this basename: AMMONIA:5 in the last 168 hours CBC:  Lab 04/30/12 0652 04/28/12 0330 04/26/12 0416  WBC 10.6* 12.5* 11.8*  NEUTROABS -- -- --  HGB 9.3* 9.1* 8.3*  HCT 28.0* 27.6* 25.6*  MCV 83.1 82.4 84.8  PLT 409* 367 261   Cardiac Enzymes: No results found for this basename: CKTOTAL:5,CKMB:5,CKMBINDEX:5,TROPONINI:5 in the last 168 hours BNP: BNP (last 3 results)  Basename 04/24/12 0530 04/23/12 0400 04/22/12 0415  PROBNP 125.0 177.1* 593.2*   CBG:  Lab 05/02/12 1132 05/02/12 0724 05/01/12 2158 05/01/12 1612 05/01/12 1208  GLUCAP 156* 132* 106* 96 259*       Signed:  Cynde Menard  [facility] 05/02/2012, 12:17 PM

## 2012-05-02 NOTE — Progress Notes (Signed)
05/02/12 Patient 02 sat's resting 100%, ambulating 100%, on return to room 98%. No need for 02 at home. Patient is going home today with [facility] , PT,OT, And aide.

## 2012-05-02 NOTE — Consult Note (Signed)
Reason for Consult: SI? Referring Physician: unknown  [name] is an 47 y.o. female.  HPI:   [name] is a 47 y.o., female with a PMHX of DM2 (A1c 8.2), HLD, bipolar disorder, schizoaffective disorder, and alcohol abuse who is admitted to [facility] on 04/21/2012 on transfer from [facility] (admitted on 04/20/2012) with severe CAP in setting of acute respiratory failure requiring intubation on 04/20/2012.   Interval hx:  Her mood has improved after coming here.  Denies any psychosis now. Thinks current psy meds are helping.   Past Medical History  Diagnosis Date  . Arteriosclerotic cardiovascular disease (ASCVD)     Minimal at cath in [facility].stress nuclear study in 8/08 with nl EF; neg stress echo in 2010  . Diabetes mellitus, type 2 2000    Onset in 2000; no insulin  . Hyperlipidemia   . Hypertension `    during treatment with Geodon  . Gastroesophageal reflux disease     Schatzki's ring  . Anemia, iron deficiency   . Alcohol abuse   . Depression   . Community acquired pneumonia 01/03/10    2011; with pleural effusion-[facility] acute resp failure  . Obesity   . Community acquired pneumonia 05/2010  . Schizoaffective disorder     requiring multiple psychiatric admissions  . Dysphagia   . Diastolic dysfunction     grade 2 per echo 2011    Past Surgical History  Procedure Date  . Dilation and curettage, diagnostic / therapeutic 1992  . Esophagogastroduodenoscopy 09/16/08    Dr. Ronni Rumble hiatal hernia/excoriations involving the cardia and mucosa consistent with trauma, antral erosions  of linear petechiae ? gastritis versus early gastric antral vascular  ectasia.Marland Kitchen biopsy showed reactive gastropathy. No H. pylori.  . Esophagogastroduodenoscopy 09/2007    Dr. Rinaldo Ratel ring, dilated to 56 French Maloney dilator, small hiatal hernia, antral erosions, biopsies reactive gastropathy.  Gaspar Bidding dilation 07/17/2011    [facility]  sedation-->distal esophageal stricture s/p dilation, chronic gastritis, multiple ulcers in stomach. no h.pylori  . Colonoscopy 01/2006    internal hemorrhoids  . Colonoscopy 01/10/2012    Procedure: COLONOSCOPY;  Surgeon: Corbin Ade, MD;  Location: [facility];  Service: Endoscopy;  Laterality: N/A;  entered cecum @ [phone] ; total cecal withdrawal time = 8 minutes    Family History  Problem Relation Age of Onset  . Colon cancer Other   . Hypertension Mother   . Stroke Father     deceased at age 83  . Heart disease Sister   . Anesthesia problems Neg Hx   . Hypotension Neg Hx   . Malignant hyperthermia Neg Hx   . Pseudochol deficiency Neg Hx     Social History:  reports that she has been smoking Cigarettes.  She has a 45 pack-year smoking history. She does not have any smokeless tobacco history on file. She reports that she does not drink alcohol or use illicit drugs.  Allergies:  Allergies  Allergen Reactions  . Metronidazole Shortness Of Breath and Swelling  . Orange Itching  . Shrimp (Shellfish Allergy) Shortness Of Breath and Itching    Takes Benadryl before eating shrimp.  Marland Kitchen Penicillins Hives and Swelling    Fever as well  . Sulfonamide Derivatives Hives    fever  . Glipizide Other (See Comments)    psychosis  . Sulfamethoxazole W-Trimethoprim Rash    Medications: I have reviewed the patient's current medications.  Results for orders placed during the hospital  encounter of 04/20/12 (from the past 48 hour(s))  GLUCOSE, CAPILLARY     Status: Abnormal   Collection Time   04/30/12  9:24 PM      Component Value Range Comment   Glucose-Capillary 130 (*) 70 - 99 mg/dL   GLUCOSE, CAPILLARY     Status: Abnormal   Collection Time   05/01/12  8:13 AM      Component Value Range Comment   Glucose-Capillary 146 (*) 70 - 99 mg/dL    Comment 1 Notify RN     GLUCOSE, CAPILLARY     Status: Abnormal   Collection Time   05/01/12 12:08 PM      Component Value Range Comment    Glucose-Capillary 259 (*) 70 - 99 mg/dL    Comment 1 Notify RN     GLUCOSE, CAPILLARY     Status: Normal   Collection Time   05/01/12  4:12 PM      Component Value Range Comment   Glucose-Capillary 96  70 - 99 mg/dL    Comment 1 Notify RN     GLUCOSE, CAPILLARY     Status: Abnormal   Collection Time   05/01/12  9:58 PM      Component Value Range Comment   Glucose-Capillary 106 (*) 70 - 99 mg/dL   GLUCOSE, CAPILLARY     Status: Abnormal   Collection Time   05/02/12  7:24 AM      Component Value Range Comment   Glucose-Capillary 132 (*) 70 - 99 mg/dL   GLUCOSE, CAPILLARY     Status: Abnormal   Collection Time   05/02/12 11:32 AM      Component Value Range Comment   Glucose-Capillary 156 (*) 70 - 99 mg/dL     Dg Knee 1-2 Views Right  05/01/2012  *RADIOLOGY REPORT*  Clinical Data: Right leg pain.  No known injury.  RIGHT KNEE - 1-2 VIEW  Comparison: 05/04/2010  Findings: The right knee appears intact. No evidence of acute fracture or subluxation.  No focal bone lesions.  Bone matrix and cortex appear intact.  No abnormal radiopaque densities in the soft tissues.  No significant effusion.  No significant change since previous study.  IMPRESSION: No acute bony abnormalities.   Original Report Authenticated By: Burman Nieves, M.D.     Review of Systems  Gastrointestinal: Positive for blood in stool.   Blood pressure 160/86, pulse 102, temperature 99 F (37.2 C), temperature source Oral, resp. rate 20, height 5\' 5"  (1.651 m), weight 88.3 kg (194 lb 10.7 oz), last menstrual period 04/29/2012, SpO2 91.00%. Physical Exam   Mental Status Examination/Evaluation:  Appearance: on bed  Eye Contact::good  Speech: normal  Volume: Normal  Mood:ok  Affect: ristricted  Thought Process: organized  Orientation: Full  Thought Content: NO AVH  Suicidal Thoughts: no  Homicidal Thoughts: no  Memory: Recent; Poor  Judgement: Impaired  Insight: Lacking  Psychomotor Activity:  Normal  Concentration: fair  Recall: poor  Akathisia: No  Assessment:  AXIS I: Bipolar d/o   AXIS II: Deferred  AXIS III: see emdical hx ? ?  ? ?  ?  ? ?  ?  ? ?  ?  ? ?  ?  ?  AXIS IV: unknown  AXIS V: 50  ?  Treatment Plan/Recommendations:  1. Continue current psy meds. Pt is not interested to change any meds now.  2. Will recommend out pt Psy f/u after medical clearance. Psy SW can help. Pt is willing  to go [facility] now. Pt agreed with this plan.   3. Will continue to follow as needed  Wonda Cerise 05/02/2012, 8:19 PM

## 2012-05-02 NOTE — Progress Notes (Signed)
[facility]  Patient Status: New  [facility] is providing the following services: PT, OT and HHA  If patient discharges after hours, please call [phone].   Wynelle Bourgeois 05/02/2012, 1:34 PM

## 2012-05-04 ENCOUNTER — Other Ambulatory Visit: Payer: Self-pay | Admitting: Internal Medicine

## 2012-05-07 ENCOUNTER — Telehealth: Payer: Self-pay | Admitting: *Deleted

## 2012-05-07 DIAGNOSIS — Z8701 Personal history of pneumonia (recurrent): Secondary | ICD-10-CM | POA: Diagnosis not present

## 2012-05-07 DIAGNOSIS — IMO0001 Reserved for inherently not codable concepts without codable children: Secondary | ICD-10-CM | POA: Diagnosis not present

## 2012-05-07 DIAGNOSIS — E119 Type 2 diabetes mellitus without complications: Secondary | ICD-10-CM | POA: Diagnosis not present

## 2012-05-07 DIAGNOSIS — I509 Heart failure, unspecified: Secondary | ICD-10-CM | POA: Diagnosis not present

## 2012-05-07 DIAGNOSIS — F319 Bipolar disorder, unspecified: Secondary | ICD-10-CM | POA: Diagnosis not present

## 2012-05-07 DIAGNOSIS — I2510 Atherosclerotic heart disease of native coronary artery without angina pectoris: Secondary | ICD-10-CM | POA: Diagnosis not present

## 2012-05-07 NOTE — Telephone Encounter (Signed)
Pt called stating she was d/c from hospital on 1/17 and was not given a HFU appointment.  (  Looks like she was treated by hospitalist ) Do you want her scheduled for HFU?  You don't have any appointments for the nest few weeks.  She is also asking for a Rx for a shower chair to be sent to [place]  with a Dx code on it.   Pt # [id number]

## 2012-05-07 NOTE — Telephone Encounter (Signed)
Ok to follow up mid March, as she will need repeat echo & CXR at that time.  It seems like she was discharged with a 3 in 1 commode/chair (3n1 as shower chair per OT note) - I think this made bedside commode can be used as shower chair to.  If this is not true, I will send in script.  Thanks!

## 2012-05-07 NOTE — Telephone Encounter (Signed)
Ok. Thanks, will do

## 2012-05-07 NOTE — Telephone Encounter (Signed)
The commode chair does not fit in her shower.  Please write a Rx for shower chair.

## 2012-05-08 ENCOUNTER — Other Ambulatory Visit: Payer: Self-pay | Admitting: *Deleted

## 2012-05-08 DIAGNOSIS — F319 Bipolar disorder, unspecified: Secondary | ICD-10-CM | POA: Diagnosis not present

## 2012-05-08 DIAGNOSIS — I2510 Atherosclerotic heart disease of native coronary artery without angina pectoris: Secondary | ICD-10-CM | POA: Diagnosis not present

## 2012-05-08 DIAGNOSIS — E119 Type 2 diabetes mellitus without complications: Secondary | ICD-10-CM | POA: Diagnosis not present

## 2012-05-08 DIAGNOSIS — IMO0001 Reserved for inherently not codable concepts without codable children: Secondary | ICD-10-CM | POA: Diagnosis not present

## 2012-05-08 DIAGNOSIS — Z8701 Personal history of pneumonia (recurrent): Secondary | ICD-10-CM | POA: Diagnosis not present

## 2012-05-08 DIAGNOSIS — I509 Heart failure, unspecified: Secondary | ICD-10-CM | POA: Diagnosis not present

## 2012-05-08 NOTE — Progress Notes (Signed)
[facility]  

## 2012-05-12 DIAGNOSIS — E119 Type 2 diabetes mellitus without complications: Secondary | ICD-10-CM | POA: Diagnosis not present

## 2012-05-12 DIAGNOSIS — I2510 Atherosclerotic heart disease of native coronary artery without angina pectoris: Secondary | ICD-10-CM | POA: Diagnosis not present

## 2012-05-12 DIAGNOSIS — Z8701 Personal history of pneumonia (recurrent): Secondary | ICD-10-CM | POA: Diagnosis not present

## 2012-05-12 DIAGNOSIS — F319 Bipolar disorder, unspecified: Secondary | ICD-10-CM | POA: Diagnosis not present

## 2012-05-12 DIAGNOSIS — I509 Heart failure, unspecified: Secondary | ICD-10-CM | POA: Diagnosis not present

## 2012-05-12 DIAGNOSIS — IMO0001 Reserved for inherently not codable concepts without codable children: Secondary | ICD-10-CM | POA: Diagnosis not present

## 2012-05-13 ENCOUNTER — Encounter (HOSPITAL_COMMUNITY): Payer: Self-pay | Admitting: Emergency Medicine

## 2012-05-13 ENCOUNTER — Emergency Department (HOSPITAL_COMMUNITY)
Admission: EM | Admit: 2012-05-13 | Discharge: 2012-05-13 | Disposition: A | Discharge status: Home / Self Care | Payer: Medicare Other | Attending: Emergency Medicine | Admitting: Emergency Medicine

## 2012-05-13 ENCOUNTER — Emergency Department (HOSPITAL_COMMUNITY): Payer: Medicare Other

## 2012-05-13 DIAGNOSIS — E1169 Type 2 diabetes mellitus with other specified complication: Secondary | ICD-10-CM | POA: Diagnosis not present

## 2012-05-13 DIAGNOSIS — E785 Hyperlipidemia, unspecified: Secondary | ICD-10-CM | POA: Insufficient documentation

## 2012-05-13 DIAGNOSIS — Z79899 Other long term (current) drug therapy: Secondary | ICD-10-CM | POA: Insufficient documentation

## 2012-05-13 DIAGNOSIS — IMO0002 Reserved for concepts with insufficient information to code with codable children: Secondary | ICD-10-CM | POA: Insufficient documentation

## 2012-05-13 DIAGNOSIS — I10 Essential (primary) hypertension: Secondary | ICD-10-CM | POA: Insufficient documentation

## 2012-05-13 DIAGNOSIS — F329 Major depressive disorder, single episode, unspecified: Secondary | ICD-10-CM | POA: Diagnosis not present

## 2012-05-13 DIAGNOSIS — Z8679 Personal history of other diseases of the circulatory system: Secondary | ICD-10-CM | POA: Diagnosis not present

## 2012-05-13 DIAGNOSIS — I2510 Atherosclerotic heart disease of native coronary artery without angina pectoris: Secondary | ICD-10-CM | POA: Diagnosis not present

## 2012-05-13 DIAGNOSIS — F259 Schizoaffective disorder, unspecified: Secondary | ICD-10-CM | POA: Diagnosis not present

## 2012-05-13 DIAGNOSIS — D509 Iron deficiency anemia, unspecified: Secondary | ICD-10-CM | POA: Diagnosis not present

## 2012-05-13 DIAGNOSIS — Z8701 Personal history of pneumonia (recurrent): Secondary | ICD-10-CM | POA: Diagnosis not present

## 2012-05-13 DIAGNOSIS — F3289 Other specified depressive episodes: Secondary | ICD-10-CM | POA: Insufficient documentation

## 2012-05-13 DIAGNOSIS — Z8719 Personal history of other diseases of the digestive system: Secondary | ICD-10-CM | POA: Insufficient documentation

## 2012-05-13 DIAGNOSIS — I509 Heart failure, unspecified: Secondary | ICD-10-CM | POA: Diagnosis not present

## 2012-05-13 DIAGNOSIS — K219 Gastro-esophageal reflux disease without esophagitis: Secondary | ICD-10-CM | POA: Insufficient documentation

## 2012-05-13 DIAGNOSIS — E669 Obesity, unspecified: Secondary | ICD-10-CM | POA: Diagnosis not present

## 2012-05-13 DIAGNOSIS — Z87891 Personal history of nicotine dependence: Secondary | ICD-10-CM | POA: Diagnosis not present

## 2012-05-13 DIAGNOSIS — R5383 Other fatigue: Secondary | ICD-10-CM | POA: Diagnosis not present

## 2012-05-13 DIAGNOSIS — F319 Bipolar disorder, unspecified: Secondary | ICD-10-CM | POA: Diagnosis not present

## 2012-05-13 DIAGNOSIS — R5381 Other malaise: Secondary | ICD-10-CM | POA: Diagnosis not present

## 2012-05-13 DIAGNOSIS — Q391 Atresia of esophagus with tracheo-esophageal fistula: Secondary | ICD-10-CM | POA: Insufficient documentation

## 2012-05-13 DIAGNOSIS — R0602 Shortness of breath: Secondary | ICD-10-CM | POA: Diagnosis not present

## 2012-05-13 DIAGNOSIS — Q393 Congenital stenosis and stricture of esophagus: Secondary | ICD-10-CM | POA: Insufficient documentation

## 2012-05-13 DIAGNOSIS — R739 Hyperglycemia, unspecified: Secondary | ICD-10-CM

## 2012-05-13 DIAGNOSIS — IMO0001 Reserved for inherently not codable concepts without codable children: Secondary | ICD-10-CM | POA: Diagnosis not present

## 2012-05-13 DIAGNOSIS — E119 Type 2 diabetes mellitus without complications: Secondary | ICD-10-CM | POA: Diagnosis not present

## 2012-05-13 LAB — GLUCOSE, CAPILLARY: Glucose-Capillary: 266 mg/dL — ABNORMAL HIGH (ref 70–99)

## 2012-05-13 NOTE — ED Provider Notes (Signed)
History     CSN: [id number]  Arrival date & time 05/13/12  0014   First MD Initiated Contact with Patient 05/13/12 0053      Chief Complaint  Patient presents with  . Hyperglycemia    (Consider location/radiation/quality/duration/timing/severity/associated sxs/prior treatment) HPI [name] is a 47 y.o. female who presents to the Emergency Department complaining of elevated blood sugar and sounds like fluids in her lungs. She reports both of these after checking her sugar and having it register 372. She denies feeling poorly. She was checking her blood sugar as her discharge papers had told her.   PCP  Dr. Everardo Beals Past Medical History  Diagnosis Date  . Arteriosclerotic cardiovascular disease (ASCVD)     Minimal at cath in [facility].stress nuclear study in 8/08 with nl EF; neg stress echo in 2010  . Diabetes mellitus, type 2 2000    Onset in 2000; no insulin  . Hyperlipidemia   . Hypertension `    during treatment with Geodon  . Gastroesophageal reflux disease     Schatzki's ring  . Anemia, iron deficiency   . Alcohol abuse   . Depression   . Community acquired pneumonia 01/03/10    2011; with pleural [facility] acute resp failure  . Obesity   . Community acquired pneumonia 05/2010  . Schizoaffective disorder     requiring multiple psychiatric admissions  . Dysphagia   . Diastolic dysfunction     grade 2 per echo 2011    Past Surgical History  Procedure Date  . Dilation and curettage, diagnostic / therapeutic 1992  . Esophagogastroduodenoscopy 09/16/08    Dr. Ronni Rumble hiatal hernia/excoriations involving the cardia and mucosa consistent with trauma, antral erosions  of linear petechiae ? gastritis versus early gastric antral vascular  ectasia.Marland Kitchen biopsy showed reactive gastropathy. No H. pylori.  . Esophagogastroduodenoscopy 09/2007    Dr. Rinaldo Ratel ring, dilated to 56 French Maloney dilator, small hiatal hernia, antral erosions,  biopsies reactive gastropathy.  Gaspar Bidding dilation 07/17/2011    [facility] sedation-->distal esophageal stricture s/p dilation, chronic gastritis, multiple ulcers in stomach. no h.pylori  . Colonoscopy 01/2006    internal hemorrhoids  . Colonoscopy 01/10/2012    Procedure: COLONOSCOPY;  Surgeon: Corbin Ade, MD;  Location: [facility];  Service: Endoscopy;  Laterality: N/A;  entered cecum @ [phone] ; total cecal withdrawal time = 8 minutes    Family History  Problem Relation Age of Onset  . Colon cancer Other   . Hypertension Mother   . Stroke Father     deceased at age 1  . Heart disease Sister   . Anesthesia problems Neg Hx   . Hypotension Neg Hx   . Malignant hyperthermia Neg Hx   . Pseudochol deficiency Neg Hx     History  Substance Use Topics  . Smoking status: Former Smoker -- 1.5 packs/day for 30 years    Types: Cigarettes  . Smokeless tobacco: Not on file     Comment: Sometimes she smokes more; up to 2 packs/day., currently trying to quit (September 2013).  . Alcohol Use: No     Comment: occasional; intake has decreased since she found out she has ulcer, no significant alcohol since April 2013.    OB History    Grav Para Term Preterm Abortions TAB SAB Ect Mult Living                  Review of Systems  Constitutional: Negative for fever.  10 Systems reviewed and are negative for acute change except as noted in the HPI.  HENT: Negative for congestion.   Eyes: Negative for discharge and redness.  Respiratory: Negative for cough and shortness of breath.   Cardiovascular: Negative for chest pain.  Gastrointestinal: Negative for vomiting and abdominal pain.  Musculoskeletal: Negative for back pain.  Skin: Negative for rash.  Neurological: Negative for syncope, numbness and headaches.  Psychiatric/Behavioral:       No behavior change.Blood sugar 372    Allergies  Metronidazole; Orange; Shrimp; Penicillins; Sulfonamide derivatives; Glipizide; and Sulfamethoxazole  w-trimethoprim  Home Medications   Current Outpatient Rx  Name  Route  Sig  Dispense  Refill  . ALBUTEROL SULFATE HFA 108 (90 BASE) MCG/ACT IN AERS   Inhalation   Inhale 2 puffs into the lungs every 4 (four) hours as needed for wheezing.   1 Inhaler   0   . ALBUTEROL SULFATE HFA 108 (90 BASE) MCG/ACT IN AERS   Inhalation   Inhale 2 puffs into the lungs every 6 (six) hours as needed for wheezing or shortness of breath.   1 Inhaler   2   . CETIRIZINE HCL 10 MG PO TABS   Oral   Take 1 tablet (10 mg total) by mouth at bedtime.   30 tablet   0   . DIVALPROEX SODIUM ER 500 MG PO TB24   Oral   Take 1 tablet (500 mg total) by mouth at bedtime.   30 tablet   0   . DIVALPROEX SODIUM 250 MG PO TBEC   Oral   Take 1 tablet (250 mg total) by mouth every morning.   30 tablet   0   . GLUCERNA SHAKE PO LIQD   Oral   Take 237 mLs by mouth 2 (two) times daily between meals.   60 Can   0   . FERROUS GLUCONATE 240 (27 FE) MG PO TABS   Oral   Take 1 tablet (240 mg total) by mouth 2 (two) times daily.   60 tablet   0   . GUAIFENESIN ER 600 MG PO TB12   Oral   Take 1 tablet (600 mg total) by mouth 2 (two) times daily as needed.   60 tablet   0   . HYDROCHLOROTHIAZIDE 25 MG PO TABS   Oral   Take 1 tablet (25 mg total) by mouth daily.   30 tablet   0   . IBUPROFEN 200 MG PO TABS   Oral   Take 400 mg by mouth every 8 (eight) hours as needed. Aches and pains, fever         . INSULIN GLARGINE 100 UNIT/ML [place] SOLN   Subcutaneous   Inject 10 Units into the skin at bedtime.   10 mL   0   . LOVASTATIN 20 MG PO TABS   Oral   Take 1 tablet (20 mg total) by mouth at bedtime.   90 tablet   1   . METFORMIN HCL 1000 MG PO TABS      TAKE (1) TABLET BY MOUTH TWICE DAILY WITH MEALS.   62 tablet   5   . METOPROLOL TARTRATE 50 MG PO TABS   Oral   Take 1 tablet (50 mg total) by mouth 2 (two) times daily.   62 tablet   11   . MOMETASONE FUROATE 50 MCG/ACT NA SUSP      2  sprays in each nostril daily   17  g   0   . PANTOPRAZOLE SODIUM 40 MG PO TBEC   Oral   Take 1 tablet (40 mg total) by mouth 2 (two) times daily.   30 tablet   0   . TRAZODONE HCL 100 MG PO TABS   Oral   Take 100 mg by mouth at bedtime.         Marland Kitchen ZIPRASIDONE HCL 80 MG PO CAPS   Oral   Take 1 capsule (80 mg total) by mouth 2 (two) times daily with a meal.   60 capsule   0   . ACETAMINOPHEN 160 MG/5ML PO SOLN   Oral   Take 20.3 mLs (650 mg total) by mouth every 6 (six) hours as needed for fever or pain (alternative to suppository).   120 mL      . GABAPENTIN 300 MG PO CAPS   Oral   Take 1 capsule (300 mg total) by mouth 3 (three) times daily as needed (anxiety).   90 capsule   0   . HYDROCODONE-HOMATROPINE 5-1.5 MG/5ML PO SYRP   Oral   Take 5 mLs by mouth every 6 (six) hours as needed for cough.   120 mL   0   . HYDROXYZINE HCL 50 MG PO TABS   Oral   Take 1 tablet (50 mg total) by mouth 3 (three) times daily as needed for anxiety.   30 tablet   0   . MIRTAZAPINE 15 MG PO TABS   Oral   Take 1 tablet (15 mg total) by mouth at bedtime.   30 tablet   0     BP 122/65  Pulse 71  Temp 98 F (36.7 C) (Oral)  SpO2 100%  LMP 04/29/2012  Physical Exam  Nursing note and vitals reviewed. Constitutional:       Awake, alert, nontoxic appearance.  HENT:  Head: Atraumatic.  Eyes: Right eye exhibits no discharge. Left eye exhibits no discharge.  Neck: Neck supple.  Cardiovascular: Normal heart sounds.   Pulmonary/Chest: Effort normal and breath sounds normal. She exhibits no tenderness.  Abdominal: Soft. Bowel sounds are normal. There is no tenderness. There is no rebound.  Musculoskeletal: She exhibits no tenderness.       Baseline ROM, no obvious new focal weakness.  Neurological:       Mental status and motor strength appears baseline for patient and situation.  Skin: No rash noted.  Psychiatric: She has a normal mood and affect.    ED Course  Procedures  (including critical care time) Results for orders placed during the hospital encounter of 05/13/12  GLUCOSE, CAPILLARY      Component Value Range   Glucose-Capillary 266 (*) 70 - 99 mg/dL    Dg Chest 2 View  4/54/0981  *RADIOLOGY REPORT*  Clinical Data: 47 year old female with weakness and shortness of breath.  CHEST - 2 VIEW  Comparison: 04/30/2012 and prior chest radiograph  Findings: The cardiomediastinal silhouette is unremarkable.  There is no evidence of focal airspace disease, pulmonary edema, suspicious pulmonary nodule/mass, pleural effusion, or pneumothorax. No acute bony abnormalities are identified.  IMPRESSION: No evidence of acute cardiopulmonary disease.   Original Report Authenticated By: Harmon Pier, M.D.         MDM  Patient with blood sugar up and chest xray negative. Pt stable in ED with no significant deterioration in condition.The patient appears reasonably screened and/or stabilized for discharge and I doubt any other medical condition or other [facility] requiring further screening, evaluation,  or treatment in the ED at this time prior to discharge.Pt stable in ED with no significant deterioration in condition.The patient appears reasonably screened and/or stabilized for discharge and I doubt any other medical condition or other [facility] requiring further screening, evaluation, or treatment in the ED at this time prior to discharge.  MDM Reviewed: nursing note and vitals Interpretation: labs and x-ray          Nicoletta Dress. Colon Branch, MD 05/13/12 [phone]

## 2012-05-13 NOTE — ED Notes (Signed)
Patient c/o high blood sugar at home; states was 372.  Patient states she feels like she has fluid on her lungs; states she just got out of hospital on 05/02/12 for pneumonia.

## 2012-05-13 NOTE — ED Notes (Signed)
Pt notes hyperglycemia and some mild SOB. States "i feel like i have fluid in my lungs" patient was admitted and intubated at [facility] for [facility] and was discharged on 1-17. Airway WLD, lungs slightly diminished. Pt aax4.

## 2012-05-14 ENCOUNTER — Encounter: Payer: Self-pay | Admitting: Internal Medicine

## 2012-05-14 ENCOUNTER — Ambulatory Visit (INDEPENDENT_AMBULATORY_CARE_PROVIDER_SITE_OTHER): Payer: Medicare Other | Admitting: Internal Medicine

## 2012-05-14 VITALS — BP 102/74 | HR 78 | Temp 98.2°F | Wt 192.3 lb

## 2012-05-14 DIAGNOSIS — E119 Type 2 diabetes mellitus without complications: Secondary | ICD-10-CM | POA: Diagnosis not present

## 2012-05-14 DIAGNOSIS — J9589 Other postprocedural complications and disorders of respiratory system, not elsewhere classified: Secondary | ICD-10-CM | POA: Diagnosis not present

## 2012-05-14 DIAGNOSIS — I2789 Other specified pulmonary heart diseases: Secondary | ICD-10-CM

## 2012-05-14 DIAGNOSIS — J80 Acute respiratory distress syndrome: Secondary | ICD-10-CM

## 2012-05-14 DIAGNOSIS — I272 Pulmonary hypertension, unspecified: Secondary | ICD-10-CM

## 2012-05-14 DIAGNOSIS — I10 Essential (primary) hypertension: Secondary | ICD-10-CM | POA: Diagnosis not present

## 2012-05-14 MED ORDER — INSULIN GLARGINE 100 UNIT/ML ~~LOC~~ SOLN: 14.0000 [IU] | Freq: Every day | SUBCUTANEOUS | Status: DC

## 2012-05-14 MED ORDER — HYDROCHLOROTHIAZIDE 25 MG PO TABS: 12.5000 mg | ORAL_TABLET | Freq: Every day | ORAL | Status: DC

## 2012-05-14 NOTE — Patient Instructions (Addendum)
General Instructions: -Continue lantus 14units before bed.  Please start checking your blood sugar first thing in the morning -Regarding your blood pressure, decrease your hydrochlorothiazide to 12.5mg  (half tablet) daily; continue to monitor your blood pressure at home.  Please be sure to bring all of your medications with you to every visit.  Should you have any new or worsening symptoms, please be sure to call the clinic at [phone].   Treatment Goals:  Goals (1 Years of Data) as of 05/14/2012          As of Today 05/13/12 05/02/12 05/01/12 05/01/12     Blood Pressure    . Blood Pressure < 140/90  102/74 122/65 160/86 133/85 130/85     Result Component    . HEMOGLOBIN A1C < 7.0          . LDL CALC < 100            Progress Toward Treatment Goals:  Treatment Goal 05/14/2012  Hemoglobin A1C deteriorated  Blood pressure unchanged  Other  deteriorated    Self Care Goals & Plans:  Self Care Goal 05/14/2012  Manage my medications take my medicines as prescribed  Monitor my health keep track of my blood glucose; bring my glucose meter and log to each visit  Eat healthy foods eat foods that are low in salt; eat baked foods instead of fried foods    Home Blood Glucose Monitoring 05/14/2012  Check my blood sugar once a day  When to check my blood sugar at bedtime     Care Management & Community Referrals:  Referral 05/14/2012  Referrals made for care management support none needed

## 2012-05-14 NOTE — Progress Notes (Signed)
Subjective:   Patient ID: [name] female   DOB: 1965-05-22 47 y.o.   MRN: [id number]  HPI: [name] is a 47 y.o. woman with history of diabetes and hypertension presents for hospital follow up; A1c on 04/20/12 increased to 8.2 from 7.  She was recently hospitalized from 04/20/12-05/02/12 with ARDS d/t CAP (intubated on 04/20/12, extubated on 04/27/12).  During that hospitalization, she did experience suicidal ideation and was subsequently placed on suicide precautions and was seen by psychaitry.  During hospitalization, she was found to have pulmonary HTN (likely from acute respiratory distress), and it was recommended to repeat echo in 2 months. Since hospitalization, she reports feeling significantly better but does have persistent dyspnea on exertion.  She tells me that her physical therapist is concerned about her heart given the way she feels during therapy.  She has only needed her albuterol once since hospital discharge.  She was most recently seen in the ED on 1/28/144 for hyperglycemia; felt like fluid in lungs, but CXR did not suggest fluid overload at that time. She does not have her glucometer with her today, but does report high sugars recently.  She has seen numbers >300, but has had lows in 100s.  She takes lantus 14u qHS  Regarding depression, she is no longer suicidal and is stable on her bipolar regimen - she is followed by psych at [facility].  She has an appt with [facility] 05/26/12.  Regarding her hypertension, she monitors her pressures at home, and reports feeling some mild dizziness/light headedness when her SBP is in low 100s, as it is today.   Past Medical History  Diagnosis Date  . Arteriosclerotic cardiovascular disease (ASCVD)     Minimal at cath in [facility].stress nuclear study in 8/08 with nl EF; neg stress echo in 2010  . Diabetes mellitus, type 2 2000    Onset in 2000; no insulin  . Hyperlipidemia   . Hypertension `    during treatment with  Geodon  . Gastroesophageal reflux disease     Schatzki's ring  . Anemia, iron deficiency   . Alcohol abuse   . Depression   . Community acquired pneumonia 01/03/10    2011; with pleural [facility] acute resp failure  . Obesity   . Community acquired pneumonia 05/2010  . Schizoaffective disorder     requiring multiple psychiatric admissions  . Dysphagia   . Diastolic dysfunction     grade 2 per echo 2011   Current Outpatient Prescriptions  Medication Sig Dispense Refill  . acetaminophen (TYLENOL) 160 MG/5ML solution Take 20.3 mLs (650 mg total) by mouth every 6 (six) hours as needed for fever or pain (alternative to suppository).  120 mL    . albuterol (PROVENTIL HFA;VENTOLIN HFA) 108 (90 BASE) MCG/ACT inhaler Inhale 2 puffs into the lungs every 4 (four) hours as needed for wheezing.  1 Inhaler  0  . albuterol (PROVENTIL HFA;VENTOLIN HFA) 108 (90 BASE) MCG/ACT inhaler Inhale 2 puffs into the lungs every 6 (six) hours as needed for wheezing or shortness of breath.  1 Inhaler  2  . cetirizine (ZYRTEC) 10 MG tablet Take 1 tablet (10 mg total) by mouth at bedtime.  30 tablet  0  . divalproex (DEPAKOTE ER) 500 MG 24 hr tablet Take 1 tablet (500 mg total) by mouth at bedtime.  30 tablet  0  . divalproex (DEPAKOTE) 250 MG DR tablet Take 1 tablet (250 mg total) by mouth every morning.  30  tablet  0  . feeding supplement (GLUCERNA SHAKE) LIQD Take 237 mLs by mouth 2 (two) times daily between meals.  60 Can  0  . ferrous gluconate (FERGON) 240 (27 FE) MG tablet Take 1 tablet (240 mg total) by mouth 2 (two) times daily.  60 tablet  0  . gabapentin (NEURONTIN) 300 MG capsule Take 1 capsule (300 mg total) by mouth 3 (three) times daily as needed (anxiety).  90 capsule  0  . guaiFENesin (MUCINEX) 600 MG 12 hr tablet Take 1 tablet (600 mg total) by mouth 2 (two) times daily as needed.  60 tablet  0  . hydrochlorothiazide (HYDRODIURIL) 25 MG tablet Take 1 tablet (25 mg total) by mouth daily.   30 tablet  0  . HYDROcodone-homatropine (HYCODAN) 5-1.5 MG/5ML syrup Take 5 mLs by mouth every 6 (six) hours as needed for cough.  120 mL  0  . hydrOXYzine (ATARAX/VISTARIL) 50 MG tablet Take 1 tablet (50 mg total) by mouth 3 (three) times daily as needed for anxiety.  30 tablet  0  . ibuprofen (ADVIL,MOTRIN) 200 MG tablet Take 400 mg by mouth every 8 (eight) hours as needed. Aches and pains, fever      . insulin glargine (LANTUS) 100 UNIT/ML injection Inject 10 Units into the skin at bedtime.  10 mL  0  . lovastatin (MEVACOR) 20 MG tablet Take 1 tablet (20 mg total) by mouth at bedtime.  90 tablet  1  . metFORMIN (GLUCOPHAGE) 1000 MG tablet TAKE (1) TABLET BY MOUTH TWICE DAILY WITH MEALS.  62 tablet  5  . metoprolol (LOPRESSOR) 50 MG tablet Take 1 tablet (50 mg total) by mouth 2 (two) times daily.  62 tablet  11  . mirtazapine (REMERON) 15 MG tablet Take 1 tablet (15 mg total) by mouth at bedtime.  30 tablet  0  . mometasone (NASONEX) 50 MCG/ACT nasal spray 2 sprays in each nostril daily  17 g  0  . pantoprazole (PROTONIX) 40 MG tablet Take 1 tablet (40 mg total) by mouth 2 (two) times daily.  30 tablet  0  . traZODone (DESYREL) 100 MG tablet Take 100 mg by mouth at bedtime.      . ziprasidone (GEODON) 80 MG capsule Take 1 capsule (80 mg total) by mouth 2 (two) times daily with a meal.  60 capsule  0   Family History  Problem Relation Age of Onset  . Colon cancer Other   . Hypertension Mother   . Stroke Father     deceased at age 69  . Heart disease Sister   . Anesthesia problems Neg Hx   . Hypotension Neg Hx   . Malignant hyperthermia Neg Hx   . Pseudochol deficiency Neg Hx    History   Social History  . Marital Status: Single    Spouse Name: N/A    Number of Children: N/A  . Years of Education: 12   Occupational History  . Disability   . UNEMPLOYED    Social History Main Topics  . Smoking status: Former Smoker -- 1.5 packs/day for 30 years    Types: Cigarettes  . Smokeless  tobacco: None     Comment: Sometimes she smokes more; up to 2 packs/day., currently trying to quit (September 2013).  . Alcohol Use: No     Comment: occasional; intake has decreased since she found out she has ulcer, no significant alcohol since April 2013.  . Drug Use: No  . Sexually Active:  Not Currently -- Female partner(s)    Birth Control/ Protection: None, Inserts   Other Topics Concern  . 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   Respiratory: per HPI Cardiovascular: Denies chest pain, palpitations and leg swelling.  Gastrointestinal: Denies nausea, vomiting, abdominal pain, diarrhea, constipation, blood in stool and abdominal distention.  Genitourinary: Denies dysuria, urgency, frequency, hematuria, flank pain and difficulty urinating.  Skin: Denies pallor, rash and wound.  Neurological: Denies seizures, syncope, weakness,numbness and headaches.   Objective:  Physical Exam: Filed Vitals:   05/14/12 1528  BP: 102/74  Pulse: 78  Temp: 98.2 F (36.8 C)  TempSrc: Oral  Weight: 192 lb 4.8 oz (87.227 kg)  SpO2: 100%   Constitutional: Vital signs reviewed.  Patient is a well-developed and well-nourished woman in no acute distress and cooperative with exam.  Mouth: no erythema or exudates, MMM Eyes: PERRL, EOMI, conjunctivae normal, No scleral icterus.  Cardiovascular: RRR, S1 normal, S2 normal, no MRG, pulses symmetric and intact bilaterally Pulmonary/Chest: soft right sided  rhonchi (upper middle lobe predom) Abdominal: Soft. Non-tender, non-distended, bowel sounds are normal, no masses, organomegaly, or guarding present.  Neurological: A&O x3 Skin: Warm, dry and intact. No rash, cyanosis, or clubbing.    Assessment & Plan:  Case and care discussed with Dr. Criselda Peaches. Please see problem oriented charting for further details. Patient to return in early April to follow up on DM and HTN (HCTZ decreased during today's visit).

## 2012-05-15 DIAGNOSIS — E119 Type 2 diabetes mellitus without complications: Secondary | ICD-10-CM | POA: Diagnosis not present

## 2012-05-15 DIAGNOSIS — Z8701 Personal history of pneumonia (recurrent): Secondary | ICD-10-CM | POA: Diagnosis not present

## 2012-05-15 DIAGNOSIS — IMO0001 Reserved for inherently not codable concepts without codable children: Secondary | ICD-10-CM | POA: Diagnosis not present

## 2012-05-15 DIAGNOSIS — I2510 Atherosclerotic heart disease of native coronary artery without angina pectoris: Secondary | ICD-10-CM | POA: Diagnosis not present

## 2012-05-15 DIAGNOSIS — I509 Heart failure, unspecified: Secondary | ICD-10-CM | POA: Diagnosis not present

## 2012-05-15 DIAGNOSIS — F319 Bipolar disorder, unspecified: Secondary | ICD-10-CM | POA: Diagnosis not present

## 2012-05-15 NOTE — Assessment & Plan Note (Signed)
BP Readings from Last 3 Encounters:  05/14/12 102/74  05/13/12 122/65  05/02/12 160/86    Lab Results  Component Value Date   NA 137 04/30/2012   K 3.4* 04/30/2012   CREATININE 0.57 04/30/2012    Assessment:  Blood pressure control: controlled  Progress toward BP goal:  unchanged  Plan:  Medications:  Given symptoms of hypotension, will decrease HCTZ to 12.5 mg daily and continue metoprolol at current dose  Educational resources provided: brochure  Self management tools provided: home blood pressure logbook

## 2012-05-15 NOTE — Assessment & Plan Note (Signed)
Likely reason for dyspnea on exertion. Patient will need repeat echo in 06/2012

## 2012-05-15 NOTE — Assessment & Plan Note (Signed)
Lab Results  Component Value Date   HGBA1C 8.2* 04/20/2012   HGBA1C 7.0 01/04/2012   HGBA1C 7.3 10/04/2011     Assessment:  Diabetes control: fair control  Progress toward A1C goal:  deteriorated  Comments: likely exacerbated by recent illness  Plan:  Medications:  Increase lantus to 14u qHS; patient to monitor CBGs and call if persistently elevated; otherwise she should return in April for DM follow up  Home glucose monitoring:   Frequency: once a day   Timing: at bedtime  Instruction/counseling given: reminded to bring blood glucose meter & log to each visit and reminded to bring medications to each visit  Educational resources provided: brochure  Self management tools provided: home glucose logbook

## 2012-05-16 DIAGNOSIS — IMO0001 Reserved for inherently not codable concepts without codable children: Secondary | ICD-10-CM | POA: Diagnosis not present

## 2012-05-16 DIAGNOSIS — F319 Bipolar disorder, unspecified: Secondary | ICD-10-CM | POA: Diagnosis not present

## 2012-05-16 DIAGNOSIS — Z8701 Personal history of pneumonia (recurrent): Secondary | ICD-10-CM | POA: Diagnosis not present

## 2012-05-16 DIAGNOSIS — I509 Heart failure, unspecified: Secondary | ICD-10-CM | POA: Diagnosis not present

## 2012-05-16 DIAGNOSIS — E119 Type 2 diabetes mellitus without complications: Secondary | ICD-10-CM | POA: Diagnosis not present

## 2012-05-16 DIAGNOSIS — I2510 Atherosclerotic heart disease of native coronary artery without angina pectoris: Secondary | ICD-10-CM | POA: Diagnosis not present

## 2012-05-19 DIAGNOSIS — IMO0001 Reserved for inherently not codable concepts without codable children: Secondary | ICD-10-CM | POA: Diagnosis not present

## 2012-05-19 DIAGNOSIS — I509 Heart failure, unspecified: Secondary | ICD-10-CM | POA: Diagnosis not present

## 2012-05-19 DIAGNOSIS — I2510 Atherosclerotic heart disease of native coronary artery without angina pectoris: Secondary | ICD-10-CM | POA: Diagnosis not present

## 2012-05-19 DIAGNOSIS — Z8701 Personal history of pneumonia (recurrent): Secondary | ICD-10-CM | POA: Diagnosis not present

## 2012-05-19 DIAGNOSIS — F319 Bipolar disorder, unspecified: Secondary | ICD-10-CM | POA: Diagnosis not present

## 2012-05-19 DIAGNOSIS — E119 Type 2 diabetes mellitus without complications: Secondary | ICD-10-CM | POA: Diagnosis not present

## 2012-05-20 DIAGNOSIS — I2510 Atherosclerotic heart disease of native coronary artery without angina pectoris: Secondary | ICD-10-CM | POA: Diagnosis not present

## 2012-05-20 DIAGNOSIS — Z8701 Personal history of pneumonia (recurrent): Secondary | ICD-10-CM | POA: Diagnosis not present

## 2012-05-20 DIAGNOSIS — F319 Bipolar disorder, unspecified: Secondary | ICD-10-CM | POA: Diagnosis not present

## 2012-05-20 DIAGNOSIS — I509 Heart failure, unspecified: Secondary | ICD-10-CM | POA: Diagnosis not present

## 2012-05-20 DIAGNOSIS — E119 Type 2 diabetes mellitus without complications: Secondary | ICD-10-CM | POA: Diagnosis not present

## 2012-05-20 DIAGNOSIS — IMO0001 Reserved for inherently not codable concepts without codable children: Secondary | ICD-10-CM | POA: Diagnosis not present

## 2012-05-21 DIAGNOSIS — E119 Type 2 diabetes mellitus without complications: Secondary | ICD-10-CM | POA: Diagnosis not present

## 2012-05-21 DIAGNOSIS — IMO0001 Reserved for inherently not codable concepts without codable children: Secondary | ICD-10-CM | POA: Diagnosis not present

## 2012-05-21 DIAGNOSIS — Z8701 Personal history of pneumonia (recurrent): Secondary | ICD-10-CM | POA: Diagnosis not present

## 2012-05-21 DIAGNOSIS — I2510 Atherosclerotic heart disease of native coronary artery without angina pectoris: Secondary | ICD-10-CM | POA: Diagnosis not present

## 2012-05-21 DIAGNOSIS — F319 Bipolar disorder, unspecified: Secondary | ICD-10-CM | POA: Diagnosis not present

## 2012-05-21 DIAGNOSIS — I509 Heart failure, unspecified: Secondary | ICD-10-CM | POA: Diagnosis not present

## 2012-05-22 DIAGNOSIS — I2510 Atherosclerotic heart disease of native coronary artery without angina pectoris: Secondary | ICD-10-CM | POA: Diagnosis not present

## 2012-05-22 DIAGNOSIS — F319 Bipolar disorder, unspecified: Secondary | ICD-10-CM | POA: Diagnosis not present

## 2012-05-22 DIAGNOSIS — I509 Heart failure, unspecified: Secondary | ICD-10-CM | POA: Diagnosis not present

## 2012-05-22 DIAGNOSIS — E119 Type 2 diabetes mellitus without complications: Secondary | ICD-10-CM | POA: Diagnosis not present

## 2012-05-22 DIAGNOSIS — Z8701 Personal history of pneumonia (recurrent): Secondary | ICD-10-CM | POA: Diagnosis not present

## 2012-05-22 DIAGNOSIS — IMO0001 Reserved for inherently not codable concepts without codable children: Secondary | ICD-10-CM | POA: Diagnosis not present

## 2012-05-24 ENCOUNTER — Emergency Department (INDEPENDENT_AMBULATORY_CARE_PROVIDER_SITE_OTHER)
Admission: EM | Admit: 2012-05-24 | Discharge: 2012-05-24 | Disposition: A | Discharge status: Home / Self Care | Payer: Medicare Other | Source: Home / Self Care

## 2012-05-24 ENCOUNTER — Encounter (HOSPITAL_COMMUNITY): Payer: Self-pay | Admitting: Emergency Medicine

## 2012-05-24 DIAGNOSIS — H9209 Otalgia, unspecified ear: Secondary | ICD-10-CM | POA: Diagnosis not present

## 2012-05-24 DIAGNOSIS — J029 Acute pharyngitis, unspecified: Secondary | ICD-10-CM | POA: Diagnosis not present

## 2012-05-24 DIAGNOSIS — H9202 Otalgia, left ear: Secondary | ICD-10-CM

## 2012-05-24 DIAGNOSIS — H6980 Other specified disorders of Eustachian tube, unspecified ear: Secondary | ICD-10-CM | POA: Diagnosis not present

## 2012-05-24 DIAGNOSIS — H6983 Other specified disorders of Eustachian tube, bilateral: Secondary | ICD-10-CM

## 2012-05-24 LAB — POCT RAPID STREP A: Streptococcus, Group A Screen (Direct): NEGATIVE

## 2012-05-24 MED ORDER — ANTIPYRINE-BENZOCAINE 5.4-1.4 % OT SOLN: 3.0000 [drp] | OTIC | Status: DC | PRN

## 2012-05-24 NOTE — ED Notes (Signed)
Pt is here for left ear pain and sore throat x2 days.  Sx include: Headache Denies: f/v/n/d Was hospitalized last month and dx w/pneumonia; trach was placed at time of hosp.   She is alert w/no signs of acute distress.

## 2012-05-24 NOTE — ED Provider Notes (Signed)
History     CSN: [id number]  Arrival date & time 05/24/12  1339   First MD Initiated Contact with Patient 05/24/12 1539      Chief Complaint  Patient presents with  . Otalgia    (Consider location/radiation/quality/duration/timing/severity/associated sxs/prior treatment) HPI Comments: 47 year old female presents with two-day history of sharp left ear pain. She also has minor sore throat. Denies fever nasal congestion, chest pain, shortness of breath, abdominal pain or GI/GU symptoms.   Past Medical History  Diagnosis Date  . Arteriosclerotic cardiovascular disease (ASCVD)     Minimal at cath in [facility].stress nuclear study in 8/08 with nl EF; neg stress echo in 2010  . Diabetes mellitus, type 2 2000    Onset in 2000; no insulin  . Hyperlipidemia   . Hypertension `    during treatment with Geodon  . Gastroesophageal reflux disease     Schatzki's ring  . Anemia, iron deficiency   . Alcohol abuse   . Depression   . Community acquired pneumonia 01/03/10    2011; with pleural [facility] acute resp failure  . Obesity   . Community acquired pneumonia 05/2010  . Schizoaffective disorder     requiring multiple psychiatric admissions  . Dysphagia   . Diastolic dysfunction     grade 2 per echo 2011    Past Surgical History  Procedure Laterality Date  . Dilation and curettage, diagnostic / therapeutic  1992  . Esophagogastroduodenoscopy  09/16/08    Dr. Ronni Rumble hiatal hernia/excoriations involving the cardia and mucosa consistent with trauma, antral erosions  of linear petechiae ? gastritis versus early gastric antral vascular  ectasia.Marland Kitchen biopsy showed reactive gastropathy. No H. pylori.  . Esophagogastroduodenoscopy  09/2007    Dr. Rinaldo Ratel ring, dilated to 56 French Maloney dilator, small hiatal hernia, antral erosions, biopsies reactive gastropathy.  Gaspar Bidding dilation  07/17/2011    [facility] sedation-->distal esophageal stricture s/p dilation,  chronic gastritis, multiple ulcers in stomach. no h.pylori  . Colonoscopy  01/2006    internal hemorrhoids  . Colonoscopy  01/10/2012    Procedure: COLONOSCOPY;  Surgeon: Corbin Ade, MD;  Location: [facility];  Service: Endoscopy;  Laterality: N/A;  entered cecum @ [phone] ; total cecal withdrawal time = 8 minutes    Family History  Problem Relation Age of Onset  . Colon cancer Other   . Hypertension Mother   . Stroke Father     deceased at age 46  . Heart disease Sister   . Anesthesia problems Neg Hx   . Hypotension Neg Hx   . Malignant hyperthermia Neg Hx   . Pseudochol deficiency Neg Hx     History  Substance Use Topics  . Smoking status: Former Smoker -- 1.50 packs/day for 30 years    Types: Cigarettes  . Smokeless tobacco: Not on file     Comment: Sometimes she smokes more; up to 2 packs/day., currently trying to quit (September 2013).  . Alcohol Use: No     Comment: occasional; intake has decreased since she found out she has ulcer, no significant alcohol since April 2013.    OB History   Grav Para Term Preterm Abortions TAB SAB Ect Mult Living                  Review of Systems  Constitutional: Negative for fever.       Feels cold all the time  HENT: Positive for ear pain. Negative for nosebleeds, congestion, rhinorrhea, mouth sores, trouble  swallowing, neck pain, neck stiffness, postnasal drip and ear discharge.   Respiratory: Negative.   Gastrointestinal: Negative.   Genitourinary: Negative.   Musculoskeletal: Negative.   Skin: Negative.   Neurological: Negative.   Psychiatric/Behavioral: Negative.     Allergies  Metronidazole; Orange; Shrimp; Penicillins; Sulfonamide derivatives; Glipizide; and Sulfamethoxazole w-trimethoprim  Home Medications   Current Outpatient Rx  Name  Route  Sig  Dispense  Refill  . acetaminophen (TYLENOL) 160 MG/5ML solution   Oral   Take 20.3 mLs (650 mg total) by mouth every 6 (six) hours as needed for fever or pain  (alternative to suppository).   120 mL      . albuterol (PROVENTIL HFA;VENTOLIN HFA) 108 (90 BASE) MCG/ACT inhaler   Inhalation   Inhale 2 puffs into the lungs every 6 (six) hours as needed for wheezing or shortness of breath.   1 Inhaler   2   . antipyrine-benzocaine (AURALGAN) otic solution   Left Ear   Place 3 drops into the left ear every 2 (two) hours as needed for pain.   10 mL   0   . cetirizine (ZYRTEC) 10 MG tablet   Oral   Take 1 tablet (10 mg total) by mouth at bedtime.   30 tablet   0   . divalproex (DEPAKOTE ER) 500 MG 24 hr tablet   Oral   Take 1 tablet (500 mg total) by mouth at bedtime.   30 tablet   0   . divalproex (DEPAKOTE) 250 MG DR tablet   Oral   Take 1 tablet (250 mg total) by mouth every morning.   30 tablet   0   . feeding supplement (GLUCERNA SHAKE) LIQD   Oral   Take 237 mLs by mouth 2 (two) times daily between meals.   60 Can   0   . ferrous gluconate (FERGON) 240 (27 FE) MG tablet   Oral   Take 1 tablet (240 mg total) by mouth 2 (two) times daily.   60 tablet   0   . gabapentin (NEURONTIN) 300 MG capsule   Oral   Take 1 capsule (300 mg total) by mouth 3 (three) times daily as needed (anxiety).   90 capsule   0   . guaiFENesin (MUCINEX) 600 MG 12 hr tablet   Oral   Take 1 tablet (600 mg total) by mouth 2 (two) times daily as needed.   60 tablet   0   . hydrochlorothiazide (HYDRODIURIL) 25 MG tablet   Oral   Take 0.5 tablets (12.5 mg total) by mouth daily.   30 tablet   3   . HYDROcodone-homatropine (HYCODAN) 5-1.5 MG/5ML syrup   Oral   Take 5 mLs by mouth every 6 (six) hours as needed for cough.   120 mL   0   . hydrOXYzine (ATARAX/VISTARIL) 50 MG tablet   Oral   Take 1 tablet (50 mg total) by mouth 3 (three) times daily as needed for anxiety.   30 tablet   0   . ibuprofen (ADVIL,MOTRIN) 200 MG tablet   Oral   Take 400 mg by mouth every 8 (eight) hours as needed. Aches and pains, fever         . insulin  glargine (LANTUS) 100 UNIT/ML injection   Subcutaneous   Inject 14 Units into the skin at bedtime.   10 mL   0   . lovastatin (MEVACOR) 20 MG tablet   Oral   Take 1 tablet (20  mg total) by mouth at bedtime.   90 tablet   1   . metFORMIN (GLUCOPHAGE) 1000 MG tablet      TAKE (1) TABLET BY MOUTH TWICE DAILY WITH MEALS.   62 tablet   5   . metoprolol (LOPRESSOR) 50 MG tablet   Oral   Take 1 tablet (50 mg total) by mouth 2 (two) times daily.   62 tablet   11   . mirtazapine (REMERON) 15 MG tablet   Oral   Take 1 tablet (15 mg total) by mouth at bedtime.   30 tablet   0   . mometasone (NASONEX) 50 MCG/ACT nasal spray      2 sprays in each nostril daily   17 g   0   . NOVOFINE 32G X 6 MM MISC               . pantoprazole (PROTONIX) 40 MG tablet   Oral   Take 1 tablet (40 mg total) by mouth 2 (two) times daily.   30 tablet   0   . traZODone (DESYREL) 100 MG tablet   Oral   Take 100 mg by mouth at bedtime.         . ziprasidone (GEODON) 80 MG capsule   Oral   Take 1 capsule (80 mg total) by mouth 2 (two) times daily with a meal.   60 capsule   0     BP 118/77  Pulse 77  Temp(Src) 98 F (36.7 C) (Oral)  Resp 16  SpO2 99%  Physical Exam  Nursing note and vitals reviewed. Constitutional: She is oriented to person, place, and time. She appears well-developed and well-nourished. No distress.  HENT:  Head: Normocephalic and atraumatic.  Mouth/Throat: No oropharyngeal exudate.  Left TM pearly gray with minor retraction, translucent, bony landmarks visible and normal light reflex. Right TM with marked retraction and injection over the bony prominences. OP with minor erythema and clear PND.  Eyes: Conjunctivae and EOM are normal.  Neck: Normal range of motion. Neck supple.  Cardiovascular: Normal rate, regular rhythm and normal heart sounds.   Pulmonary/Chest: Effort normal and breath sounds normal. No respiratory distress. She has no wheezes. She has  no rales.  Abdominal: Soft. There is no tenderness.  Musculoskeletal: Normal range of motion. She exhibits no edema.  Lymphadenopathy:    She has cervical adenopathy.  Neurological: She is alert and oriented to person, place, and time.  Skin: Skin is warm and dry. No rash noted.  Psychiatric: She has a normal mood and affect.    ED Course  Procedures (including critical care time)  Labs Reviewed  POCT RAPID STREP A ([facility] ONLY)   No results found.   1. ETD (eustachian tube dysfunction), bilateral   2. Otalgia of left ear   3. Pharyngitis       MDM  Auralgan otic drops every 3 hours as directed for pain to Sudafed PE 10 mg every 4 hours as needed for pain to Taking Mucinex as directed Tylenol every 4 hours as needed or ibuprofen every 6 hours as needed for pain and discomfort Followup with your doctor next week if needed. If worsening , new symptoms or problems may return Results for orders placed during the hospital encounter of 05/24/12  POCT RAPID STREP A ([facility] ONLY)      Result Value Range   Streptococcus, Group A Screen (Direct) NEGATIVE  NEGATIVE  Hayden Rasmussen, NP 05/24/12 [phone]

## 2012-05-26 NOTE — ED Provider Notes (Signed)
Medical screening examination/treatment/procedure(s) were performed by resident physician or non-physician practitioner and as supervising physician I was immediately available for consultation/collaboration.   Javoris Star DOUGLAS MD.   Jamill Wetmore D Mozell Haber, MD 05/26/12 1439 

## 2012-05-27 DIAGNOSIS — I509 Heart failure, unspecified: Secondary | ICD-10-CM | POA: Diagnosis not present

## 2012-05-27 DIAGNOSIS — E119 Type 2 diabetes mellitus without complications: Secondary | ICD-10-CM | POA: Diagnosis not present

## 2012-05-27 DIAGNOSIS — IMO0001 Reserved for inherently not codable concepts without codable children: Secondary | ICD-10-CM | POA: Diagnosis not present

## 2012-05-27 DIAGNOSIS — Z8701 Personal history of pneumonia (recurrent): Secondary | ICD-10-CM | POA: Diagnosis not present

## 2012-05-27 DIAGNOSIS — I2510 Atherosclerotic heart disease of native coronary artery without angina pectoris: Secondary | ICD-10-CM | POA: Diagnosis not present

## 2012-05-27 DIAGNOSIS — F319 Bipolar disorder, unspecified: Secondary | ICD-10-CM | POA: Diagnosis not present

## 2012-06-04 ENCOUNTER — Other Ambulatory Visit: Payer: Self-pay | Admitting: Internal Medicine

## 2012-06-12 ENCOUNTER — Encounter: Payer: Self-pay | Admitting: Internal Medicine

## 2012-06-12 ENCOUNTER — Ambulatory Visit (INDEPENDENT_AMBULATORY_CARE_PROVIDER_SITE_OTHER): Payer: Medicare Other | Admitting: Internal Medicine

## 2012-06-12 VITALS — BP 135/82 | HR 87 | Temp 98.1°F | Ht 64.0 in | Wt 204.8 lb

## 2012-06-12 DIAGNOSIS — R51 Headache: Secondary | ICD-10-CM | POA: Diagnosis not present

## 2012-06-12 DIAGNOSIS — R519 Headache, unspecified: Secondary | ICD-10-CM | POA: Insufficient documentation

## 2012-06-12 DIAGNOSIS — M25569 Pain in unspecified knee: Secondary | ICD-10-CM

## 2012-06-12 DIAGNOSIS — E785 Hyperlipidemia, unspecified: Secondary | ICD-10-CM

## 2012-06-12 DIAGNOSIS — D509 Iron deficiency anemia, unspecified: Secondary | ICD-10-CM

## 2012-06-12 DIAGNOSIS — M79609 Pain in unspecified limb: Secondary | ICD-10-CM

## 2012-06-12 DIAGNOSIS — I10 Essential (primary) hypertension: Secondary | ICD-10-CM | POA: Diagnosis not present

## 2012-06-12 DIAGNOSIS — D649 Anemia, unspecified: Secondary | ICD-10-CM

## 2012-06-12 DIAGNOSIS — M25561 Pain in right knee: Secondary | ICD-10-CM

## 2012-06-12 DIAGNOSIS — M79604 Pain in right leg: Secondary | ICD-10-CM

## 2012-06-12 DIAGNOSIS — E119 Type 2 diabetes mellitus without complications: Secondary | ICD-10-CM

## 2012-06-12 LAB — CBC WITH DIFFERENTIAL/PLATELET
Eosinophils Absolute: 0.1 10*3/uL (ref 0.0–0.7)
Hemoglobin: 11.5 g/dL — ABNORMAL LOW (ref 12.0–15.0)
Lymphs Abs: 2.2 10*3/uL (ref 0.7–4.0)
MCH: 27.3 pg (ref 26.0–34.0)
Monocytes Relative: 5 % (ref 3–12)
Neutro Abs: 3.6 10*3/uL (ref 1.7–7.7)
Neutrophils Relative %: 58 % (ref 43–77)
Platelets: 278 10*3/uL (ref 150–400)
RBC: 4.22 MIL/uL (ref 3.87–5.11)
WBC: 6.2 10*3/uL (ref 4.0–10.5)

## 2012-06-12 LAB — COMPLETE METABOLIC PANEL WITH GFR
AST: 8 U/L (ref 0–37)
Albumin: 3.7 g/dL (ref 3.5–5.2)
Alkaline Phosphatase: 59 U/L (ref 39–117)
Calcium: 9.2 mg/dL (ref 8.4–10.5)
Chloride: 100 mEq/L (ref 96–112)
Potassium: 3.8 mEq/L (ref 3.5–5.3)
Sodium: 135 mEq/L (ref 135–145)
Total Protein: 6.2 g/dL (ref 6.0–8.3)

## 2012-06-12 LAB — CK: Total CK: 26 U/L (ref 7–177)

## 2012-06-12 LAB — ANEMIA PANEL
Folate: 10.8 ng/mL
Iron: 66 ug/dL (ref 42–145)
RBC.: 4.22 MIL/uL (ref 3.87–5.11)
Retic Ct Pct: 1.9 % (ref 0.4–2.3)
Vitamin B-12: 560 pg/mL (ref 211–911)

## 2012-06-12 MED ORDER — GABAPENTIN 300 MG PO CAPS: 300.0000 mg | ORAL_CAPSULE | Freq: Three times a day (TID) | ORAL | Status: DC

## 2012-06-12 NOTE — Patient Instructions (Addendum)
Start taking Neurontin 300 mg one tablet 3 times daily You may take ibuprofen for your headache Followup with your primary care doctor in 1-2 months

## 2012-06-12 NOTE — Progress Notes (Addendum)
Patient ID: AMBRIE CARTE, female   DOB: 1966-02-06, 47 y.o.   MRN: [id number] History of present illness: Ms. Hiscox is a 47 yo woman with PMH of bipolar, hypertension, hyperlipidemia, diastolic heart failure presents today for pain in her right leg in the past several days. She states that the pain is shooting up and down her leg and that it hurts when she stands up but she is able to bear weight. The pain is intermittent and currently she has no pain during office visit. She states that pain is 8/10 in severity. No history of trauma. She denies any back pain and that wearing tight clothes does not hurt her right leg. No fever, chills, nausea vomiting, swelling, increasing redness or increase in warmth. She does have some mild weakness in her right leg. She denies any numbness tingling or bowel or urine incontinence. She states that after she was extubated in the hospital back in January she had the same knee pain and an x-ray was taken. The result x-ray was unremarkable, and no DJD or fracture. Of note she is on lovastatin for cholesterol for a long time. She states that her mother has osteoarthritis and wondered if this is genetic. She also reported a headache located in frontal region. She denies any phonophobia or photophobia or aura. She does have a history of migraine but does not feel like this is her migraine headache. She reported being stressed out because she has recently resumed smoking but would like to quit. However she is unable to take medication to help her quit smoking do to her bipolar and would like to try hypnosis. No other complaints today.  Review of system: As per History of present illness  Physical examination: General: alert, well-developed, and cooperative to examination.  Lungs: normal respiratory effort, no accessory muscle use, normal breath sounds, no crackles, and no wheezes. Heart: normal rate, regular rhythm, no murmur, no gallop, and no rub.  Abdomen: soft,  non-tender, normal bowel sounds, no distention, no guarding, no rebound tenderness  Neurologic: nonfocal Skin: turgor normal and no rashes.  Ext: No tenderness to palpation of the right leg.  Right knee join: No effusion, no erythema, full range of motion. There are mild crepitus noted. Full range of motion of the leg. Normal gait.  Psych: Anxious

## 2012-06-12 NOTE — Assessment & Plan Note (Addendum)
Patient has shooting pain in her lower legs sometime in the upper thigh and sometime in the lower part of her right leg which appears to be similar to sciatica pain however she does not have any back pain.  She actually does not have pain in her joints.  I reviewed her right knee x-ray taken in 04/2012 which was unremarkable. No evidence of DJD or fractures. Unclear etiology the patient does have diabetes which makes peripheral neuropathy/nerve impingement a possible diagnosis. She states that she had this similar pain when she was extubated in the hospital. After she was discharged, the pain went away but now has returned in the past 2-3 days.  She reports some weakness with her right leg and that she is on a statin for a very long time therefore will need to rule out rhabdo. This could also be due to physical deconditioning which she did have during hospital course. -Will start Neurontin 300 mg 3 times a day -Will check basic labs suggest CMP as well as CK level -Consider physical therapy

## 2012-06-12 NOTE — Assessment & Plan Note (Addendum)
Patient has a history of iron deficiency anemia. Recent hemoglobin was 9.3 on 04/30/2012. Her physician assistant note and July of 2013, patient was scheduled for colonoscopy with Dr. Jena Gauss.  We will need to obtain that result. Ferritin level wa 19 in 01/2011. -Will repeat a CBC today -Will repeat anemia panel -Continue iron supplementation

## 2012-06-12 NOTE — Assessment & Plan Note (Signed)
Most likely tension headache as she is stressed out out quitting smoking. No phonophobia/photophobia or aura and not consistent with migraine. Although she reports hx of migraine headache.  She states that this does not feel like her normal migraine.  -She may take NSAIDs or Tylenol for her headache

## 2012-06-13 ENCOUNTER — Other Ambulatory Visit: Payer: Self-pay | Admitting: Internal Medicine

## 2012-06-13 MED ORDER — MAGNESIUM OXIDE 400 MG PO TABS: 400.0000 mg | ORAL_TABLET | Freq: Two times a day (BID) | ORAL | Status: DC

## 2012-06-20 ENCOUNTER — Ambulatory Visit: Payer: Self-pay | Admitting: Cardiology

## 2012-06-26 NOTE — Progress Notes (Signed)
HPI: [name] is a 47 year old patient of Dr. Juanito Doom were seeing for ongoing assessment and management of her nonobstructive coronary disease, multiple cardiac risk factors, and diastolic dysfunction. She was last seen approximately one year ago, with chronic chest discomfort, not believed to be cardiac in etiology. She is also followed by Dr. Anselm Jungling secondary to chronic lower extremity pain, and has been placed on Neurontin for symptomatic relief.   Allergies  Allergen Reactions  . Metronidazole Shortness Of Breath and Swelling  . Orange Itching  . Shrimp (Shellfish Allergy) Shortness Of Breath and Itching    Takes Benadryl before eating shrimp.  Marland Kitchen Penicillins Hives and Swelling    Fever as well  . Sulfonamide Derivatives Hives    fever  . Glipizide Other (See Comments)    psychosis  . Sulfamethoxazole W-Trimethoprim Rash    Current Outpatient Prescriptions  Medication Sig Dispense Refill  . acetaminophen (TYLENOL) 160 MG/5ML solution Take 20.3 mLs (650 mg total) by mouth every 6 (six) hours as needed for fever or pain (alternative to suppository).  120 mL    . albuterol (PROVENTIL HFA;VENTOLIN HFA) 108 (90 BASE) MCG/ACT inhaler Inhale 2 puffs into the lungs every 6 (six) hours as needed for wheezing or shortness of breath.  1 Inhaler  2  . cetirizine (ZYRTEC) 10 MG tablet TAKE ONE TABLET DAILY.  30 tablet  5  . divalproex (DEPAKOTE ER) 500 MG 24 hr tablet Take 1 tablet (500 mg total) by mouth at bedtime.  30 tablet  0  . divalproex (DEPAKOTE) 250 MG DR tablet Take 1 tablet (250 mg total) by mouth every morning.  30 tablet  0  . feeding supplement (GLUCERNA SHAKE) LIQD Take 237 mLs by mouth 2 (two) times daily between meals.  60 Can  0  . ferrous gluconate (FERGON) 240 (27 FE) MG tablet Take 1 tablet (240 mg total) by mouth 2 (two) times daily.  60 tablet  0  . gabapentin (NEURONTIN) 300 MG capsule Take 1 capsule (300 mg total) by mouth 3 (three) times daily.  90 capsule  0  .  guaiFENesin (MUCINEX) 600 MG 12 hr tablet Take 1 tablet (600 mg total) by mouth 2 (two) times daily as needed.  60 tablet  0  . hydrochlorothiazide (HYDRODIURIL) 25 MG tablet Take 0.5 tablets (12.5 mg total) by mouth daily.  30 tablet  3  . ibuprofen (ADVIL,MOTRIN) 200 MG tablet Take 400 mg by mouth every 8 (eight) hours as needed. Aches and pains, fever      . insulin glargine (LANTUS) 100 UNIT/ML injection Inject 14 Units into the skin at bedtime.  10 mL  0  . lovastatin (MEVACOR) 20 MG tablet Take 1 tablet (20 mg total) by mouth at bedtime.  90 tablet  1  . magnesium oxide (MAG-OX 400) 400 MG tablet Take 1 tablet (400 mg total) by mouth 2 (two) times daily.  6 tablet  0  . metFORMIN (GLUCOPHAGE) 1000 MG tablet TAKE (1) TABLET BY MOUTH TWICE DAILY WITH MEALS.  62 tablet  5  . metoprolol (LOPRESSOR) 50 MG tablet Take 1 tablet (50 mg total) by mouth 2 (two) times daily.  62 tablet  11  . NOVOFINE 32G X 6 MM MISC       . pantoprazole (PROTONIX) 40 MG tablet Take 1 tablet (40 mg total) by mouth 2 (two) times daily.  30 tablet  0  . traZODone (DESYREL) 100 MG tablet Take 100 mg by mouth at  bedtime.      . ziprasidone (GEODON) 80 MG capsule Take 1 capsule (80 mg total) by mouth 2 (two) times daily with a meal.  60 capsule  0   No current facility-administered medications for this visit.    Past Medical History  Diagnosis Date  . Arteriosclerotic cardiovascular disease (ASCVD)     Minimal at cath in [facility].stress nuclear study in 8/08 with nl EF; neg stress echo in 2010  . Diabetes mellitus, type 2 2000    Onset in 2000; no insulin  . Hyperlipidemia   . Hypertension `    during treatment with Geodon  . Gastroesophageal reflux disease     Schatzki's ring  . Anemia, iron deficiency   . Alcohol abuse   . Depression   . Community acquired pneumonia 01/03/10    2011; with pleural effusion-[facility] acute resp failure  . Obesity   . Community acquired pneumonia 05/2010  .  Schizoaffective disorder     requiring multiple psychiatric admissions  . Dysphagia   . Diastolic dysfunction     grade 2 per echo 2011    Past Surgical History  Procedure Laterality Date  . Dilation and curettage, diagnostic / therapeutic  1992  . Esophagogastroduodenoscopy  09/16/08    Dr. Ronni Rumble hiatal hernia/excoriations involving the cardia and mucosa consistent with trauma, antral erosions  of linear petechiae ? gastritis versus early gastric antral vascular  ectasia.Marland Kitchen biopsy showed reactive gastropathy. No H. pylori.  . Esophagogastroduodenoscopy  09/2007    Dr. Rinaldo Ratel ring, dilated to 56 French Maloney dilator, small hiatal hernia, antral erosions, biopsies reactive gastropathy.  Gaspar Bidding dilation  07/17/2011    [facility] sedation-->distal esophageal stricture s/p dilation, chronic gastritis, multiple ulcers in stomach. no h.pylori  . Colonoscopy  01/2006    internal hemorrhoids  . Colonoscopy  01/10/2012    Procedure: COLONOSCOPY;  Surgeon: Corbin Ade, MD;  Location: [facility];  Service: Endoscopy;  Laterality: N/A;  entered cecum @ [phone] ; total cecal withdrawal time = 8 minutes    JXB:JYNWGN of systems complete and found to be negative unless listed above  PHYSICAL EXAM LMP 05/23/2012 General: Well developed, well nourished, in no acute distress Head: Eyes PERRLA, No xanthomas.   Normal cephalic and atramatic  Lungs: Clear bilaterally to auscultation and percussion. Heart: HRRR S1 S2, without MRG.  Pulses are 2+ & equal.            No carotid bruit. No JVD.  No abdominal bruits. No femoral bruits. Abdomen: Bowel sounds are positive, abdomen soft and non-tender without masses or                  Hernia's noted. Msk:  Back normal, normal gait. Normal strength and tone for age. Extremities: No clubbing, cyanosis or edema.  DP +1 Neuro: Alert and oriented X 3. Psych:  Good affect, responds appropriately  EKG:  ASSESSMENT AND PLAN

## 2012-06-27 ENCOUNTER — Encounter: Payer: Self-pay | Admitting: Adult Health

## 2012-07-11 ENCOUNTER — Other Ambulatory Visit: Payer: Self-pay | Admitting: *Deleted

## 2012-07-11 DIAGNOSIS — I10 Essential (primary) hypertension: Secondary | ICD-10-CM

## 2012-07-12 MED ORDER — METOPROLOL TARTRATE 50 MG PO TABS: 50.0000 mg | ORAL_TABLET | Freq: Two times a day (BID) | ORAL | Status: DC

## 2012-07-12 MED ORDER — HYDROCHLOROTHIAZIDE 25 MG PO TABS: 12.5000 mg | ORAL_TABLET | Freq: Every day | ORAL | Status: DC

## 2012-07-13 ENCOUNTER — Emergency Department (HOSPITAL_COMMUNITY)
Admission: EM | Admit: 2012-07-13 | Discharge: 2012-07-14 | Disposition: A | Discharge status: Home / Self Care | Payer: Medicare Other | Attending: Emergency Medicine | Admitting: Emergency Medicine

## 2012-07-13 ENCOUNTER — Encounter (HOSPITAL_COMMUNITY): Payer: Self-pay | Admitting: *Deleted

## 2012-07-13 DIAGNOSIS — R1310 Dysphagia, unspecified: Secondary | ICD-10-CM | POA: Diagnosis not present

## 2012-07-13 DIAGNOSIS — R197 Diarrhea, unspecified: Secondary | ICD-10-CM | POA: Insufficient documentation

## 2012-07-13 DIAGNOSIS — R1013 Epigastric pain: Secondary | ICD-10-CM | POA: Diagnosis not present

## 2012-07-13 DIAGNOSIS — R112 Nausea with vomiting, unspecified: Secondary | ICD-10-CM | POA: Insufficient documentation

## 2012-07-13 DIAGNOSIS — Z79899 Other long term (current) drug therapy: Secondary | ICD-10-CM | POA: Diagnosis not present

## 2012-07-13 DIAGNOSIS — E119 Type 2 diabetes mellitus without complications: Secondary | ICD-10-CM | POA: Diagnosis not present

## 2012-07-13 DIAGNOSIS — D509 Iron deficiency anemia, unspecified: Secondary | ICD-10-CM | POA: Insufficient documentation

## 2012-07-13 DIAGNOSIS — R000 Tachycardia, unspecified: Secondary | ICD-10-CM | POA: Insufficient documentation

## 2012-07-13 DIAGNOSIS — R10816 Epigastric abdominal tenderness: Secondary | ICD-10-CM | POA: Diagnosis not present

## 2012-07-13 DIAGNOSIS — Z8679 Personal history of other diseases of the circulatory system: Secondary | ICD-10-CM | POA: Diagnosis not present

## 2012-07-13 DIAGNOSIS — F17200 Nicotine dependence, unspecified, uncomplicated: Secondary | ICD-10-CM | POA: Diagnosis not present

## 2012-07-13 DIAGNOSIS — R6883 Chills (without fever): Secondary | ICD-10-CM | POA: Insufficient documentation

## 2012-07-13 LAB — COMPREHENSIVE METABOLIC PANEL
ALT: 8 U/L (ref 0–35)
AST: 10 U/L (ref 0–37)
Albumin: 3.7 g/dL (ref 3.5–5.2)
Alkaline Phosphatase: 60 U/L (ref 39–117)
BUN: 17 mg/dL (ref 6–23)
CO2: 27 mEq/L (ref 19–32)
Calcium: 9.3 mg/dL (ref 8.4–10.5)
Chloride: 96 mEq/L (ref 96–112)
Creatinine, Ser: 0.91 mg/dL (ref 0.50–1.10)
GFR calc Af Amer: 88 mL/min — ABNORMAL LOW (ref >90)
GFR calc non Af Amer: 76 mL/min — ABNORMAL LOW (ref >90)
Glucose, Bld: 182 mg/dL — ABNORMAL HIGH (ref 70–99)
Potassium: 3.6 mEq/L (ref 3.5–5.1)
Sodium: 135 mEq/L (ref 135–145)
Total Bilirubin: 0.2 mg/dL — ABNORMAL LOW (ref 0.3–1.2)
Total Protein: 7 g/dL (ref 6.0–8.3)

## 2012-07-13 LAB — CBC WITH DIFFERENTIAL/PLATELET
Basophils Absolute: 0 10*3/uL (ref 0.0–0.1)
Basophils Relative: 0 % (ref 0–1)
Eosinophils Absolute: 0.1 10*3/uL (ref 0.0–0.7)
Eosinophils Relative: 1 % (ref 0–5)
HCT: 40.4 % (ref 36.0–46.0)
Hemoglobin: 13.6 g/dL (ref 12.0–15.0)
Lymphocytes Relative: 14 % (ref 12–46)
Lymphs Abs: 1.1 10*3/uL (ref 0.7–4.0)
MCH: 28.1 pg (ref 26.0–34.0)
MCHC: 33.7 g/dL (ref 30.0–36.0)
MCV: 83.5 fL (ref 78.0–100.0)
Monocytes Absolute: 0.2 10*3/uL (ref 0.1–1.0)
Monocytes Relative: 3 % (ref 3–12)
Neutro Abs: 6.1 10*3/uL (ref 1.7–7.7)
Neutrophils Relative %: 82 % — ABNORMAL HIGH (ref 43–77)
Platelets: 184 10*3/uL (ref 150–400)
RBC: 4.84 MIL/uL (ref 3.87–5.11)
RDW: 14.9 % (ref 11.5–15.5)
WBC: 7.4 10*3/uL (ref 4.0–10.5)

## 2012-07-13 LAB — URINALYSIS, ROUTINE W REFLEX MICROSCOPIC
Glucose, UA: NEGATIVE mg/dL
Ketones, ur: 40 mg/dL — AB
Leukocytes, UA: NEGATIVE
Nitrite: NEGATIVE
Protein, ur: NEGATIVE mg/dL
Specific Gravity, Urine: >1.03 — ABNORMAL HIGH (ref 1.005–1.030)
Urobilinogen, UA: 0.2 mg/dL (ref 0.0–1.0)
pH: 6 (ref 5.0–8.0)

## 2012-07-13 LAB — URINE MICROSCOPIC-ADD ON

## 2012-07-13 MED ORDER — ONDANSETRON HCL 4 MG/2ML IJ SOLN
4.0000 mg | Freq: Once | INTRAMUSCULAR | Status: AC
  Administered 2012-07-14: 4 mg via INTRAVENOUS
  Filled 2012-07-13: qty 2

## 2012-07-13 MED ORDER — SODIUM CHLORIDE 0.9 % IV SOLN
1000.0000 mL | Freq: Once | INTRAVENOUS | Status: AC
  Administered 2012-07-13: 1000 mL via INTRAVENOUS

## 2012-07-13 MED ORDER — ONDANSETRON 8 MG PO TBDP: 8.0000 mg | ORAL_TABLET | Freq: Three times a day (TID) | ORAL | Status: DC | PRN

## 2012-07-13 MED ORDER — ONDANSETRON HCL 4 MG/2ML IJ SOLN
4.0000 mg | Freq: Once | INTRAMUSCULAR | Status: AC
  Administered 2012-07-13: 4 mg via INTRAVENOUS
  Filled 2012-07-13: qty 2

## 2012-07-13 NOTE — ED Notes (Signed)
Pt c/o n/v/d x1 day. Unable to keep anything down.

## 2012-07-13 NOTE — ED Notes (Signed)
Note:  Urine obtained Clean Catch - not Cath specimen.  Lab notified.

## 2012-07-13 NOTE — ED Notes (Signed)
C/o generalized weakness.

## 2012-07-13 NOTE — ED Provider Notes (Signed)
History     CSN: [id number]  Arrival date & time 07/13/12  2042   First MD Initiated Contact with Patient 07/13/12 2148      Chief Complaint  Patient presents with  . Nausea  . Emesis  . Diarrhea  . Abdominal Pain    (Consider location/radiation/quality/duration/timing/severity/associated sxs/prior treatment) Patient is a 47 y.o. female presenting with vomiting, diarrhea, and abdominal pain. The history is provided by the patient.  Emesis Severity:  Severe Duration:  16 hours Timing:  Intermittent Number of daily episodes:  5 Quality:  Bilious material Progression:  Unchanged Chronicity:  New Recent urination:  Normal Relieved by:  Nothing Worsened by:  Liquids Ineffective treatments:  None tried Associated symptoms: abdominal pain, chills and diarrhea   Associated symptoms: no fever, no headaches, no myalgias and no sore throat   Risk factors: diabetes   Risk factors: no sick contacts   Diarrhea Associated symptoms: abdominal pain, chills and vomiting   Associated symptoms: no headaches and no myalgias   Abdominal Pain Associated symptoms: chills, diarrhea and vomiting   Associated symptoms: no constipation, no cough, no dysuria, no nausea and no sore throat     Past Medical History  Diagnosis Date  . Arteriosclerotic cardiovascular disease (ASCVD)     Minimal at cath in [facility].stress nuclear study in 8/08 with nl EF; neg stress echo in 2010  . Diabetes mellitus, type 2 2000    Onset in 2000; no insulin  . Hyperlipidemia   . Hypertension `    during treatment with Geodon  . Gastroesophageal reflux disease     Schatzki's ring  . Anemia, iron deficiency   . Alcohol abuse   . Depression   . Community acquired pneumonia 01/03/10    2011; with pleural effusion-[facility] acute resp failure  . Obesity   . Community acquired pneumonia 05/2010  . Schizoaffective disorder     requiring multiple psychiatric admissions  . Dysphagia   . Diastolic  dysfunction     grade 2 per echo 2011    Past Surgical History  Procedure Laterality Date  . Dilation and curettage, diagnostic / therapeutic  1992  . Esophagogastroduodenoscopy  09/16/08    Dr. Ronni Rumble hiatal hernia/excoriations involving the cardia and mucosa consistent with trauma, antral erosions  of linear petechiae ? gastritis versus early gastric antral vascular  ectasia.Marland Kitchen biopsy showed reactive gastropathy. No H. pylori.  . Esophagogastroduodenoscopy  09/2007    Dr. Rinaldo Ratel ring, dilated to 56 French Maloney dilator, small hiatal hernia, antral erosions, biopsies reactive gastropathy.  Gaspar Bidding dilation  07/17/2011    [facility] sedation-->distal esophageal stricture s/p dilation, chronic gastritis, multiple ulcers in stomach. no h.pylori  . Colonoscopy  01/2006    internal hemorrhoids  . Colonoscopy  01/10/2012    Procedure: COLONOSCOPY;  Surgeon: Corbin Ade, MD;  Location: [facility];  Service: Endoscopy;  Laterality: N/A;  entered cecum @ [phone] ; total cecal withdrawal time = 8 minutes    Family History  Problem Relation Age of Onset  . Colon cancer Other   . Hypertension Mother   . Stroke Father     deceased at age 13  . Heart disease Sister   . Anesthesia problems Neg Hx   . Hypotension Neg Hx   . Malignant hyperthermia Neg Hx   . Pseudochol deficiency Neg Hx     History  Substance Use Topics  . Smoking status: Current Every Day Smoker -- 1.50 packs/day for 30 years  Types: Cigarettes    Start date: 05/18/2012  . Smokeless tobacco: Not on file     Comment: Sometimes she smokes more; up to 2 packs/day., currently trying to quit (September 2013).  . Alcohol Use: No     Comment: occasional; intake has decreased since she found out she has ulcer, no significant alcohol since April 2013.    OB History   Grav Para Term Preterm Abortions TAB SAB Ect Mult Living                  Review of Systems  Constitutional: Positive for chills. Negative for  activity change.  HENT: Negative for ear pain, congestion, sore throat and neck stiffness.   Eyes: Negative for photophobia, pain and discharge.  Respiratory: Negative for cough, chest tightness and wheezing.   Gastrointestinal: Positive for vomiting, abdominal pain and diarrhea. Negative for nausea, constipation and abdominal distention.  Genitourinary: Negative for dysuria, frequency, flank pain and difficulty urinating.  Musculoskeletal: Negative for myalgias, back pain and gait problem.  Skin: Negative for color change and rash.  Allergic/Immunologic: Positive for food allergies (shrimp and oranges).  Neurological: Negative for dizziness, weakness, light-headedness and headaches.  Psychiatric/Behavioral: Negative for confusion and agitation.    Allergies  Metronidazole; Orange; Shrimp; Penicillins; Sulfonamide derivatives; Glipizide; and Sulfamethoxazole w-trimethoprim  Home Medications   Current Outpatient Rx  Name  Route  Sig  Dispense  Refill  . albuterol (PROVENTIL HFA;VENTOLIN HFA) 108 (90 BASE) MCG/ACT inhaler   Inhalation   Inhale 2 puffs into the lungs every 6 (six) hours as needed for wheezing or shortness of breath.   1 Inhaler   2   . cetirizine (ZYRTEC) 10 MG tablet   Oral   Take 10 mg by mouth daily.         . divalproex (DEPAKOTE ER) 500 MG 24 hr tablet   Oral   Take 1 tablet (500 mg total) by mouth at bedtime.   30 tablet   0   . divalproex (DEPAKOTE) 250 MG DR tablet   Oral   Take 1 tablet (250 mg total) by mouth every morning.   30 tablet   0   . feeding supplement (GLUCERNA SHAKE) LIQD   Oral   Take 237 mLs by mouth 2 (two) times daily between meals.   60 Can   0   . gabapentin (NEURONTIN) 300 MG capsule   Oral   Take 1 capsule (300 mg total) by mouth 3 (three) times daily.   90 capsule   0   . hydrochlorothiazide (HYDRODIURIL) 25 MG tablet   Oral   Take 25 mg by mouth daily.         . insulin glargine (LANTUS) 100 UNIT/ML  injection   Subcutaneous   Inject 18 Units into the skin at bedtime.         . lovastatin (MEVACOR) 20 MG tablet   Oral   Take 1 tablet (20 mg total) by mouth at bedtime.   90 tablet   1   . magnesium oxide (MAG-OX 400) 400 MG tablet   Oral   Take 1 tablet (400 mg total) by mouth 2 (two) times daily.   6 tablet   0   . metFORMIN (GLUCOPHAGE) 1000 MG tablet   Oral   Take 1,000 mg by mouth 2 (two) times daily.         . metoprolol (LOPRESSOR) 50 MG tablet   Oral   Take 1 tablet (50 mg total)  by mouth 2 (two) times daily.   62 tablet   11   . mirtazapine (REMERON) 15 MG tablet   Oral   Take 15 mg by mouth at bedtime.         Marland Kitchen NOVOFINE 32G X 6 MM MISC               . pantoprazole (PROTONIX) 40 MG tablet   Oral   Take 1 tablet (40 mg total) by mouth 2 (two) times daily.   30 tablet   0   . traZODone (DESYREL) 100 MG tablet   Oral   Take 100 mg by mouth at bedtime.         . ziprasidone (GEODON) 80 MG capsule   Oral   Take 1 capsule (80 mg total) by mouth 2 (two) times daily with a meal.   60 capsule   0   . ibuprofen (ADVIL,MOTRIN) 200 MG tablet   Oral   Take 400 mg by mouth every 8 (eight) hours as needed. Aches and pains, fever           BP 145/83  Pulse 108  Temp(Src) 99.1 F (37.3 C) (Oral)  Resp 14  Ht 5\' 4"  (1.626 m)  Wt 200 lb (90.719 kg)  BMI 34.31 kg/m2  SpO2 98%  LMP 07/12/2012  Physical Exam  Nursing note and vitals reviewed. Constitutional: She is oriented to person, place, and time. She appears well-developed and well-nourished.  HENT:  Head: Normocephalic and atraumatic.  Eyes: EOM are normal. Pupils are equal, round, and reactive to light.  Neck: Neck supple.  Cardiovascular: Tachycardia present.   Pulmonary/Chest: Effort normal and breath sounds normal.  Abdominal: Soft. Bowel sounds are increased. There is tenderness in the epigastric area. There is no rigidity, no rebound, no guarding and no CVA tenderness.   Musculoskeletal: Normal range of motion. She exhibits no edema.  Neurological: She is alert and oriented to person, place, and time. No cranial nerve deficit.  Skin: Skin is warm and dry.  Psychiatric: She has a normal mood and affect. Her behavior is normal. Judgment and thought content normal.   Results for orders placed during the hospital encounter of 07/13/12 (from the past 24 hour(s))  CBC WITH DIFFERENTIAL     Status: Abnormal   Collection Time    07/13/12  9:52 PM      Result Value Range   WBC 7.4  4.0 - 10.5 K/uL   RBC 4.84  3.87 - 5.11 MIL/uL   Hemoglobin 13.6  12.0 - 15.0 g/dL   HCT 40.1  02.7 - 25.3 %   MCV 83.5  78.0 - 100.0 fL   MCH 28.1  26.0 - 34.0 pg   MCHC 33.7  30.0 - 36.0 g/dL   RDW 66.4  40.3 - 47.4 %   Platelets 184  150 - 400 K/uL   Neutrophils Relative 82 (*) 43 - 77 %   Neutro Abs 6.1  1.7 - 7.7 K/uL   Lymphocytes Relative 14  12 - 46 %   Lymphs Abs 1.1  0.7 - 4.0 K/uL   Monocytes Relative 3  3 - 12 %   Monocytes Absolute 0.2  0.1 - 1.0 K/uL   Eosinophils Relative 1  0 - 5 %   Eosinophils Absolute 0.1  0.0 - 0.7 K/uL   Basophils Relative 0  0 - 1 %   Basophils Absolute 0.0  0.0 - 0.1 K/uL  COMPREHENSIVE METABOLIC PANEL  Status: Abnormal   Collection Time    07/13/12  9:52 PM      Result Value Range   Sodium 135  135 - 145 mEq/L   Potassium 3.6  3.5 - 5.1 mEq/L   Chloride 96  96 - 112 mEq/L   CO2 27  19 - 32 mEq/L   Glucose, Bld 182 (*) 70 - 99 mg/dL   BUN 17  6 - 23 mg/dL   Creatinine, Ser 1.61  0.50 - 1.10 mg/dL   Calcium 9.3  8.4 - 09.6 mg/dL   Total Protein 7.0  6.0 - 8.3 g/dL   Albumin 3.7  3.5 - 5.2 g/dL   AST 10  0 - 37 U/L   ALT 8  0 - 35 U/L   Alkaline Phosphatase 60  39 - 117 U/L   Total Bilirubin 0.2 (*) 0.3 - 1.2 mg/dL   GFR calc non Af Amer 76 (*) >90 mL/min   GFR calc Af Amer 88 (*) >90 mL/min  URINALYSIS, ROUTINE W REFLEX MICROSCOPIC     Status: Abnormal   Collection Time    07/13/12 11:00 PM      Result Value Range    Color, Urine YELLOW  YELLOW   APPearance CLEAR  CLEAR   Specific Gravity, Urine >1.030 (*) 1.005 - 1.030   pH 6.0  5.0 - 8.0   Glucose, UA NEGATIVE  NEGATIVE mg/dL   Hgb urine dipstick LARGE (*) NEGATIVE   Bilirubin Urine SMALL (*) NEGATIVE   Ketones, ur 40 (*) NEGATIVE mg/dL   Protein, ur NEGATIVE  NEGATIVE mg/dL   Urobilinogen, UA 0.2  0.0 - 1.0 mg/dL   Nitrite NEGATIVE  NEGATIVE   Leukocytes, UA NEGATIVE  NEGATIVE  URINE MICROSCOPIC-ADD ON     Status: Abnormal   Collection Time    07/13/12 11:00 PM      Result Value Range   Squamous Epithelial / LPF FEW (*) RARE   WBC, UA 7-10  <3 WBC/hpf   RBC / HPF 7-10  <3 RBC/hpf   Casts HYALINE CASTS (*) NEGATIVE  Note: patient currently on menses, reason for hematuria.   ED Course  Procedures (including critical care time)  MDM  47 y.o. female  Who is diabetic with nausea, vomiting and diarrhea. After IV hydration and Zofran she is feeling much better. She is ambulating in the exam room. She has requested ice and something to drink and is tolerating well.   I have reviewed this patient's vital signs, nurses notes, appropriate labs and discussed findings with the patient and plan of care. Patient voices understanding.           Janne Napoleon, [place] 07/13/12 [phone]

## 2012-07-14 ENCOUNTER — Other Ambulatory Visit: Payer: Self-pay | Admitting: Internal Medicine

## 2012-07-14 NOTE — ED Provider Notes (Signed)
Medical screening examination/treatment/procedure(s) were performed by non-physician practitioner and as supervising physician I was immediately available for consultation/collaboration.  Nicoletta Dress. Colon Branch, MD 07/14/12 [phone]

## 2012-07-21 ENCOUNTER — Encounter: Payer: Self-pay | Admitting: *Deleted

## 2012-07-21 ENCOUNTER — Encounter: Payer: Self-pay | Admitting: Adult Health

## 2012-07-21 ENCOUNTER — Ambulatory Visit (INDEPENDENT_AMBULATORY_CARE_PROVIDER_SITE_OTHER): Payer: Medicare Other | Admitting: Adult Health

## 2012-07-21 VITALS — BP 120/70 | HR 85 | Ht 64.0 in | Wt 197.0 lb

## 2012-07-21 DIAGNOSIS — I2510 Atherosclerotic heart disease of native coronary artery without angina pectoris: Secondary | ICD-10-CM | POA: Diagnosis not present

## 2012-07-21 DIAGNOSIS — I7090 Unspecified atherosclerosis: Secondary | ICD-10-CM

## 2012-07-21 DIAGNOSIS — I5030 Unspecified diastolic (congestive) heart failure: Secondary | ICD-10-CM

## 2012-07-21 DIAGNOSIS — F17200 Nicotine dependence, unspecified, uncomplicated: Secondary | ICD-10-CM | POA: Diagnosis not present

## 2012-07-21 NOTE — Progress Notes (Signed)
HPI [name] is a 47 year old female patient of Dr. Elijah Birk wall where following for ongoing assessment and management of nonobstructive coronary artery disease with multiple cardiovascular risk factors to include hypertension diabetes hyperlipidemia and ongoing tobacco abuse. The patient was admitted for a lengthy hospitalization in January 2014 requiring intubation secondary to community-acquired pneumonia and ARDS. She is [facility]. The patient continues on inhaler treatments along with hydrochlorothiazide and metoprolol. She comes today with recurrent chest discomfort on the right with dyspnea on exertion. She unfortunately continues to smoke. She states that her breathing status has worsened somewhat with pressure in her chest as she walks at a normal pace. She is also complaining of intermittent palpitations.  Allergies  Allergen Reactions  . Metronidazole Shortness Of Breath and Swelling  . Orange Itching  . Shrimp (Shellfish Allergy) Shortness Of Breath and Itching    Takes Benadryl before eating shrimp.  Marland Kitchen Penicillins Hives and Swelling    Fever as well  . Sulfonamide Derivatives Hives    fever  . Glipizide Other (See Comments)    psychosis  . Sulfamethoxazole W-Trimethoprim Rash    Current Outpatient Prescriptions  Medication Sig Dispense Refill  . albuterol (PROVENTIL HFA;VENTOLIN HFA) 108 (90 BASE) MCG/ACT inhaler Inhale 2 puffs into the lungs every 6 (six) hours as needed for wheezing or shortness of breath.  1 Inhaler  2  . cetirizine (ZYRTEC) 10 MG tablet Take 10 mg by mouth daily.      . divalproex (DEPAKOTE ER) 500 MG 24 hr tablet Take 1 tablet (500 mg total) by mouth at bedtime.  30 tablet  0  . divalproex (DEPAKOTE) 250 MG DR tablet Take 1 tablet (250 mg total) by mouth every morning.  30 tablet  0  . gabapentin (NEURONTIN) 300 MG capsule Take 1 capsule (300 mg total) by mouth 3 (three) times daily.  90 capsule  0  .  hydrochlorothiazide (HYDRODIURIL) 25 MG tablet Take 25 mg by mouth daily.      Marland Kitchen ibuprofen (ADVIL,MOTRIN) 200 MG tablet Take 400 mg by mouth every 8 (eight) hours as needed. Aches and pains, fever      . insulin glargine (LANTUS) 100 UNIT/ML injection Inject 18 Units into the skin at bedtime.      . lovastatin (MEVACOR) 20 MG tablet Take 1 tablet (20 mg total) by mouth at bedtime.  90 tablet  1  . magnesium oxide (MAG-OX 400) 400 MG tablet Take 1 tablet (400 mg total) by mouth 2 (two) times daily.  6 tablet  0  . metFORMIN (GLUCOPHAGE) 1000 MG tablet Take 1,000 mg by mouth 2 (two) times daily.      . metoprolol (LOPRESSOR) 50 MG tablet TAKE 1 TABLET BY MOUTH TWICE DAILY.  60 tablet  5  . mirtazapine (REMERON) 15 MG tablet Take 15 mg by mouth at bedtime.      Marland Kitchen NOVOFINE 32G X 6 MM MISC       . ondansetron (ZOFRAN ODT) 8 MG disintegrating tablet Take 1 tablet (8 mg total) by mouth every 8 (eight) hours as needed for nausea.  20 tablet  0  . pantoprazole (PROTONIX) 40 MG tablet Take 1 tablet (40 mg total) by mouth 2 (two) times daily.  30 tablet  0  . traZODone (DESYREL) 100 MG tablet Take 100 mg by mouth at bedtime.      . ziprasidone (GEODON) 80 MG capsule Take 1 capsule (80 mg total) by mouth 2 (  two) times daily with a meal.  60 capsule  0   No current facility-administered medications for this visit.    Past Medical History  Diagnosis Date  . Arteriosclerotic cardiovascular disease (ASCVD)     Minimal at cath in [facility].stress nuclear study in 8/08 with nl EF; neg stress echo in 2010  . Diabetes mellitus, type 2 2000    Onset in 2000; no insulin  . Hyperlipidemia   . Hypertension `    during treatment with Geodon  . Gastroesophageal reflux disease     Schatzki's ring  . Anemia, iron deficiency   . Alcohol abuse   . Depression   . Community acquired pneumonia 01/03/10    2011; with pleural effusion-[facility] acute resp failure  . Obesity   . Community acquired  pneumonia 05/2010  . Schizoaffective disorder     requiring multiple psychiatric admissions  . Dysphagia   . Diastolic dysfunction     grade 2 per echo 2011    Past Surgical History  Procedure Laterality Date  . Dilation and curettage, diagnostic / therapeutic  1992  . Esophagogastroduodenoscopy  09/16/08    Dr. Ronni Rumble hiatal hernia/excoriations involving the cardia and mucosa consistent with trauma, antral erosions  of linear petechiae ? gastritis versus early gastric antral vascular  ectasia.Marland Kitchen biopsy showed reactive gastropathy. No H. pylori.  . Esophagogastroduodenoscopy  09/2007    Dr. Rinaldo Ratel ring, dilated to 56 French Maloney dilator, small hiatal hernia, antral erosions, biopsies reactive gastropathy.  Gaspar Bidding dilation  07/17/2011    [facility] sedation-->distal esophageal stricture s/p dilation, chronic gastritis, multiple ulcers in stomach. no h.pylori  . Colonoscopy  01/2006    internal hemorrhoids  . Colonoscopy  01/10/2012    Procedure: COLONOSCOPY;  Surgeon: Corbin Ade, MD;  Location: [facility];  Service: Endoscopy;  Laterality: N/A;  entered cecum @ 0851 ; total cecal withdrawal time = 8 minutes    ZOX:WRUEAV of systems complete and found to be negative unless listed above  PHYSICAL EXAM BP 120/70  Pulse 85  Ht 5\' 4"  (1.626 m)  Wt 197 lb (89.359 kg)  BMI 33.8 kg/m2  SpO2 98%  LMP 07/12/2012  General: Well developed, well nourished, in no acute distress Head: Eyes PERRLA, No xanthomas.   Normal cephalic and atramatic  Lungs: Clear bilaterally to auscultation and percussion. Heart: HRRR S1 S2, without MRG.  Pulses are 2+ & equal.            No carotid bruit. No JVD.  No abdominal bruits. No femoral bruits. Abdomen: Bowel sounds are positive, abdomen soft and non-tender without masses or                  Hernia's noted. Msk:  Back normal, normal gait. Normal strength and tone for age. Extremities: No clubbing, cyanosis or edema.  DP +1 Neuro: Alert and  oriented X 3. Psych:  Good affect, responds appropriately    ASSESSMENT AND PLAN

## 2012-07-21 NOTE — Assessment & Plan Note (Signed)
Continues to smoke, without any lens to quit at this time.

## 2012-07-21 NOTE — Assessment & Plan Note (Signed)
No clinical evidence of fluid retention. She appears well compensated.

## 2012-07-21 NOTE — Patient Instructions (Addendum)
Your physician recommends that you schedule a follow-up appointment in: After testing  Your physician has requested that you have a Stress lexiscan myoview. For further information please visit [email or web address] Please follow instruction sheet, as given.

## 2012-07-21 NOTE — Progress Notes (Deleted)
Name: [name]    DOB: 10/13/1965  Age: 47 y.o.  MR#: [id number]       PCP:  Stacy Gardner, MD      Insurance: Payor: MEDICARE  Plan: MEDICARE PART A AND B  Product Type: *No Product type*    CC:    Chief Complaint  Patient presents with  . Coronary Artery Disease  . Hypertension    VS Filed Vitals:   07/21/12 1433  BP: 120/70  Pulse: 85  Height: 5\' 4"  (1.626 m)  Weight: 197 lb (89.359 kg)  SpO2: 98%    Weights Current Weight  07/21/12 197 lb (89.359 kg)  07/13/12 200 lb (90.719 kg)  06/12/12 204 lb 12.8 oz (92.897 kg)    Blood Pressure  BP Readings from Last 3 Encounters:  07/21/12 120/70  07/13/12 145/83  06/12/12 135/82     Admit date:  (Not on file) Last encounter with RMR:  Visit date not found   Allergy Metronidazole; Orange; Shrimp; Penicillins; Sulfonamide derivatives; Glipizide; and Sulfamethoxazole w-trimethoprim  Current Outpatient Prescriptions  Medication Sig Dispense Refill  . albuterol (PROVENTIL HFA;VENTOLIN HFA) 108 (90 BASE) MCG/ACT inhaler Inhale 2 puffs into the lungs every 6 (six) hours as needed for wheezing or shortness of breath.  1 Inhaler  2  . cetirizine (ZYRTEC) 10 MG tablet Take 10 mg by mouth daily.      . divalproex (DEPAKOTE ER) 500 MG 24 hr tablet Take 1 tablet (500 mg total) by mouth at bedtime.  30 tablet  0  . divalproex (DEPAKOTE) 250 MG DR tablet Take 1 tablet (250 mg total) by mouth every morning.  30 tablet  0  . gabapentin (NEURONTIN) 300 MG capsule Take 1 capsule (300 mg total) by mouth 3 (three) times daily.  90 capsule  0  . hydrochlorothiazide (HYDRODIURIL) 25 MG tablet Take 25 mg by mouth daily.      Marland Kitchen ibuprofen (ADVIL,MOTRIN) 200 MG tablet Take 400 mg by mouth every 8 (eight) hours as needed. Aches and pains, fever      . insulin glargine (LANTUS) 100 UNIT/ML injection Inject 18 Units into the skin at bedtime.      . lovastatin (MEVACOR) 20 MG tablet Take 1 tablet (20 mg total) by mouth at bedtime.  90 tablet  1   . magnesium oxide (MAG-OX 400) 400 MG tablet Take 1 tablet (400 mg total) by mouth 2 (two) times daily.  6 tablet  0  . metFORMIN (GLUCOPHAGE) 1000 MG tablet Take 1,000 mg by mouth 2 (two) times daily.      . metoprolol (LOPRESSOR) 50 MG tablet TAKE 1 TABLET BY MOUTH TWICE DAILY.  60 tablet  5  . mirtazapine (REMERON) 15 MG tablet Take 15 mg by mouth at bedtime.      Marland Kitchen NOVOFINE 32G X 6 MM MISC       . ondansetron (ZOFRAN ODT) 8 MG disintegrating tablet Take 1 tablet (8 mg total) by mouth every 8 (eight) hours as needed for nausea.  20 tablet  0  . pantoprazole (PROTONIX) 40 MG tablet Take 1 tablet (40 mg total) by mouth 2 (two) times daily.  30 tablet  0  . traZODone (DESYREL) 100 MG tablet Take 100 mg by mouth at bedtime.      . ziprasidone (GEODON) 80 MG capsule Take 1 capsule (80 mg total) by mouth 2 (two) times daily with a meal.  60 capsule  0   No current facility-administered medications for this  visit.    Discontinued Meds:    Medications Discontinued During This Encounter  Medication Reason  . feeding supplement (GLUCERNA SHAKE) LIQD Discontinued by provider    Patient Active Problem List  Diagnosis  . DIABETES MELLITUS, TYPE II  . HYPERLIPIDEMIA  . OBESITY  . History of alcohol abuse  . TOBACCO ABUSE  . DEPRESSION  . HYPERTENSION  . MENORRHAGIA  . Arteriosclerotic cardiovascular disease (ASCVD)  . Diarrhea  . Esophageal dysphagia  . Abdominal pain  . IDA (iron deficiency anemia)  . Rectal bleeding  . Diastolic heart failure  . Bipolar 1 disorder  . ARDS, resolving  . Pulmonary hypertension  . Right leg pain  . Headache    LABS    Component Value Date/Time   NA 135 07/13/2012 2152   NA 135 06/12/2012 1107   NA 137 04/30/2012 0652   K 3.6 07/13/2012 2152   K 3.8 06/12/2012 1107   K 3.4* 04/30/2012 0652   CL 96 07/13/2012 2152   CL 100 06/12/2012 1107   CL 101 04/30/2012 0652   CO2 27 07/13/2012 2152   CO2 23 06/12/2012 1107   CO2 25 04/30/2012 0652   GLUCOSE  182* 07/13/2012 2152   GLUCOSE 271* 06/12/2012 1107   GLUCOSE 141* 04/30/2012 0652   BUN 17 07/13/2012 2152   BUN 8 06/12/2012 1107   BUN 8 04/30/2012 0652   CREATININE 0.91 07/13/2012 2152   CREATININE 0.78 06/12/2012 1107   CREATININE 0.57 04/30/2012 0652   CREATININE 0.55 04/29/2012 0432   CREATININE 0.78 08/10/2011 1103   CREATININE 0.90 04/04/2011 1557   CALCIUM 9.3 07/13/2012 2152   CALCIUM 9.2 06/12/2012 1107   CALCIUM 8.6 04/30/2012 0652   GFRNONAA 76* 07/13/2012 2152   GFRNONAA >90 04/30/2012 0652   GFRNONAA >90 04/29/2012 0432   GFRAA 88* 07/13/2012 2152   GFRAA >90 04/30/2012 0652   GFRAA >90 04/29/2012 0432   CMP     Component Value Date/Time   NA 135 07/13/2012 2152   K 3.6 07/13/2012 2152   CL 96 07/13/2012 2152   CO2 27 07/13/2012 2152   GLUCOSE 182* 07/13/2012 2152   BUN 17 07/13/2012 2152   CREATININE 0.91 07/13/2012 2152   CREATININE 0.78 06/12/2012 1107   CALCIUM 9.3 07/13/2012 2152   PROT 7.0 07/13/2012 2152   ALBUMIN 3.7 07/13/2012 2152   AST 10 07/13/2012 2152   ALT 8 07/13/2012 2152   ALKPHOS 60 07/13/2012 2152   BILITOT 0.2* 07/13/2012 2152   GFRNONAA 76* 07/13/2012 2152   GFRAA 88* 07/13/2012 2152       Component Value Date/Time   WBC 7.4 07/13/2012 2152   WBC 6.2 06/12/2012 1107   WBC 10.6* 04/30/2012 0652   HGB 13.6 07/13/2012 2152   HGB 11.5* 06/12/2012 1107   HGB 9.3* 04/30/2012 0652   HCT 40.4 07/13/2012 2152   HCT 35.5* 06/12/2012 1107   HCT 28.0* 04/30/2012 0652   MCV 83.5 07/13/2012 2152   MCV 84.1 06/12/2012 1107   MCV 83.1 04/30/2012 0652    Lipid Panel     Component Value Date/Time   CHOL 126 01/04/2012 1605   TRIG 186* 01/04/2012 1605   HDL 29* 01/04/2012 1605   CHOLHDL 4.3 01/04/2012 1605   VLDL 37 01/04/2012 1605   LDLCALC 60 01/04/2012 1605    ABG    Component Value Date/Time   PHART 7.385 04/23/2012 0958   PCO2ART 36.8 04/23/2012 0958   PO2ART 88.0 04/23/2012 0958  HCO3 21.6 04/23/2012 0958   TCO2 22.7 04/23/2012 0958   ACIDBASEDEF 2.7* 04/23/2012 0958   O2SAT  97.0 04/23/2012 0958     Lab Results  Component Value Date   TSH 1.369 08/10/2011   BNP (last 3 results)  Recent Labs  04/22/12 0415 04/23/12 0400 04/24/12 0530  PROBNP 593.2* 177.1* 125.0   Cardiac Panel (last 3 results) No results found for this basename: CKTOTAL, CKMB, TROPONINI, RELINDX,  in the last 72 hours  Iron/TIBC/Ferritin    Component Value Date/Time   IRON 66 06/12/2012 1107   TIBC 332 06/12/2012 1107   FERRITIN 22 06/12/2012 1107     EKG Orders placed during the hospital encounter of 04/20/12  . EKG 12-LEAD  . EKG 12-LEAD  . EKG 12-LEAD  . EKG 12-LEAD  . EKG 12-LEAD  . EKG 12-LEAD  . EKG 12-LEAD  . EKG 12-LEAD  . EKG 12-LEAD  . EKG 12-LEAD  . EKG     Prior Assessment and Plan Problem List as of 07/21/2012     ICD-9-CM     Cardiology Problems   HYPERLIPIDEMIA   Last Assessment & Plan   10/04/2011 Office Visit Written 10/04/2011  2:09 PM by Lars Mage, MD     Patient's lipid panel results were discussed. She is not due for a lipid profile check at this time.    HYPERTENSION   Last Assessment & Plan   05/14/2012 Office Visit Written 05/15/2012  8:15 PM by Belia Heman, MD      BP Readings from Last 3 Encounters:  05/14/12 102/74  05/13/12 122/65  05/02/12 160/86    Lab Results  Component Value Date   NA 137 04/30/2012   K 3.4* 04/30/2012   CREATININE 0.57 04/30/2012    Assessment:  Blood pressure control: controlled  Progress toward BP goal:  unchanged  Plan:  Medications:  Given symptoms of hypotension, will decrease HCTZ to 12.5 mg daily and continue metoprolol at current dose  Educational resources provided: brochure  Self management tools provided: home blood pressure logbook       Arteriosclerotic cardiovascular disease (ASCVD)   Last Assessment & Plan   10/22/2011 Office Visit Written 10/22/2011  5:10 PM by Gaylord Shih, MD     Stable. On good secondary preventative therapy. Symptoms of angina reviewed. Return to office in one year or  when necessary.    Diastolic heart failure   Pulmonary hypertension   Last Assessment & Plan   05/14/2012 Office Visit Written 05/15/2012  8:13 PM by Belia Heman, MD     Likely reason for dyspnea on exertion. Patient will need repeat echo in 06/2012      Other   Bipolar 1 disorder   DIABETES MELLITUS, TYPE II   Last Assessment & Plan   05/14/2012 Office Visit Written 05/15/2012  8:16 PM by Belia Heman, MD      Lab Results  Component Value Date   HGBA1C 8.2* 04/20/2012   HGBA1C 7.0 01/04/2012   HGBA1C 7.3 10/04/2011     Assessment:  Diabetes control: fair control  Progress toward A1C goal:  deteriorated  Comments: likely exacerbated by recent illness  Plan:  Medications:  Increase lantus to 14u qHS; patient to monitor CBGs and call if persistently elevated; otherwise she should return in April for DM follow up  Home glucose monitoring:   Frequency: once a day   Timing: at bedtime  Instruction/counseling given: reminded to bring blood glucose meter & log  to each visit and reminded to bring medications to each visit  Educational resources provided: brochure  Self management tools provided: home glucose logbook       OBESITY   Last Assessment & Plan   10/22/2011 Office Visit Written 10/22/2011  5:11 PM by Gaylord Shih, MD     Encouraged to lose weight.    History of alcohol abuse   Last Assessment & Plan   12/27/2010 Office Visit Written 12/27/2010 11:14 AM by Belia Heman, MD     Reports occasional EtOH use.  Has not abused EtOH in 1 year.     TOBACCO ABUSE   Last Assessment & Plan   10/22/2011 Office Visit Written 10/22/2011  5:10 PM by Gaylord Shih, MD     Advised to stop smoking.    DEPRESSION   Last Assessment & Plan   11/14/2011 Office Visit Written 11/14/2011  6:30 PM by Belia Heman, MD     Patient has felt more depressed over the weekend with the acute episode of diarrhea. She denies suicidal or homicidal ideation. She is being followed at [facility] and they're  prescribing Ativan and Geodon. She believes she has PTSD, chart review suggests schizoaffective disorder. She requests a medication to get her over this acute depression, as she has recently lost a father figure.  -No new medications given, encouraged that she schedule an appointment as soon as possible for close followup care.    MENORRHAGIA   Diarrhea   Last Assessment & Plan   11/14/2011 Office Visit Written 11/14/2011  6:27 PM by Belia Heman, MD     Patient had acute episode of diarrhea over the weekend. Episode seems to have resolved but patient seems to still be dehydrated based on orthostatic heart rate. Encouraged patient to increase water intake. If she feels nauseated she should maintain a bland diet inclusive of bread rice applesauce etc. She should return to clinic if diarrhea recurs or symptoms persist and worsen.    Esophageal dysphagia   Last Assessment & Plan   08/10/2011 Office Visit Written 08/10/2011 12:39 PM by Joselyn Arrow, NP     Improved status post dilation 4/2    Abdominal pain   Last Assessment & Plan   08/10/2011 Office Visit Edited 08/10/2011 12:39 PM by Joselyn Arrow, NP     Claria Dice is a pleasant 47 y.o. female who presents with three-week history of abdominal pain, nausea, vomiting, fatigue and diarrhea. Recent antibiotic use.  Difficult to obtain a history from Ms. Hudon as at times throughout the interview she closes her eyes inappropriately, however otherwise neurological exam is okay. She is able to ambulate without difficulty. This may be due to her schizoaffective disorder, but cannot rule out polysubstance abuse or metabolic cause. Differentials include C. difficile colitis or acute infectious colitis, pancreatitis, diverticulitis, urinary tract infection.  CBC, CMP, TSH, lipase Full set of stool studies Serum alcohol and urine drug screen UA and urine pregnancy CT scan abdomen and pelvis with IV normal contrast If you have severe abdominal  pain, dizziness, unable to keep down fluids or food, bleeding or fever over the weekend, you need to go to the emergency department ALIGN daily for your diarrhea Drink plenty fluids       IDA (iron deficiency anemia)   Last Assessment & Plan   06/12/2012 Office Visit Edited 06/12/2012 11:02 AM by Mathis Dad, MD     Patient has a history of iron deficiency  anemia. Recent hemoglobin was 9.3 on 04/30/2012. Her physician assistant note and July of 2013, patient was scheduled for colonoscopy with Dr. Jena Gauss.  We will need to obtain that result. Ferritin level wa 19 in 01/2011. -Will repeat a CBC today -Will repeat anemia panel -Continue iron supplementation    Rectal bleeding   Last Assessment & Plan   12/19/2011 Office Visit Written 12/19/2011  9:37 AM by Tiffany Kocher, PA     Chronic, intermittent rectal bleeding. Last colonoscopy in 2007, internal hemorrhoids at that time. She complains of recent moderate volume blood per rectum for which she went to the emergency department 2 days ago. Digital rectal exam by ED physician revealed heme positive stool but no gross blood, fissure, hemorrhoids. We actually tried to schedule patient for colonoscopy in July but she had to cancel for physical and emotional illness. She also complains of intermittent diarrhea. Negative stool studies in June. She has a history of iron deficiency anemia noted last year. Has been on chronic iron supplements. Hemoglobin has dropped 2 g/dL the last 4 weeks. She also notes prolonged menstrual cycle this month. Stating that she has been having regular menses every month until this one. I have encouraged her to call her gynecologist today.  Colonoscopy in the near future with Dr. Jena Gauss. Due to history of polypharmacy and alcohol abuse, requirement for deep sedation the past, we will schedule her again with deep sedation. She will hold her iron for 7 days. Day of bowel prep, she will drop her Lantus to 14 units at bedtime, metformin  to 500 mg twice a day.  In the meantime she will call if she has ongoing significant bleeding.    ARDS, resolving   Right leg pain   Last Assessment & Plan   06/12/2012 Office Visit Edited 06/12/2012  4:52 PM by Mathis Dad, MD     Patient has shooting pain in her lower legs sometime in the upper thigh and sometime in the lower part of her right leg which appears to be similar to sciatica pain however she does not have any back pain.  She actually does not have pain in her joints.  I reviewed her right knee x-ray taken in 04/2012 which was unremarkable. No evidence of DJD or fractures. Unclear etiology the patient does have diabetes which makes peripheral neuropathy/nerve impingement a possible diagnosis. She states that she had this similar pain when she was extubated in the hospital. After she was discharged, the pain went away but now has returned in the past 2-3 days.  She reports some weakness with her right leg and that she is on a statin for a very long time therefore will need to rule out rhabdo. This could also be due to physical deconditioning which she did have during hospital course. -Will start Neurontin 300 mg 3 times a day -Will check basic labs suggest CMP as well as CK level -Consider physical therapy    Headache   Last Assessment & Plan   06/12/2012 Office Visit Written 06/12/2012 11:04 AM by Mathis Dad, MD     Most likely tension headache as she is stressed out out quitting smoking. No phonophobia/photophobia or aura and not consistent with migraine. Although she reports hx of migraine headache.  She states that this does not feel like her normal migraine.  -She may take NSAIDs or Tylenol for her headache         Imaging: No results found.

## 2012-07-21 NOTE — Progress Notes (Signed)
.  zr  

## 2012-07-21 NOTE — Assessment & Plan Note (Signed)
Most recent echo revealed normal systolic function. This was completed in January 2014. She continues to have chest pressure which I believe is related to her lung disease. However she is continuing to have palpitations and mild dyspnea on exertion and is requesting a repeat stress test. We will order this and have this completed within the next week. She has multiple cardiovascular risk factors and has continued to smoke despite health warnings. We will make further recommendations with stress test has been completed.

## 2012-07-29 ENCOUNTER — Encounter (HOSPITAL_COMMUNITY): Payer: Self-pay

## 2012-07-29 ENCOUNTER — Ambulatory Visit (HOSPITAL_COMMUNITY)
Admission: RE | Admit: 2012-07-29 | Discharge: 2012-07-29 | Disposition: A | Discharge status: Home / Self Care | Payer: Medicare Other | Source: Ambulatory Visit | Attending: Adult Health | Admitting: Adult Health

## 2012-07-29 ENCOUNTER — Encounter (HOSPITAL_COMMUNITY): Payer: Self-pay | Admitting: Cardiology

## 2012-07-29 ENCOUNTER — Encounter (HOSPITAL_COMMUNITY)
Admission: RE | Admit: 2012-07-29 | Discharge: 2012-07-29 | Disposition: A | Discharge status: Home / Self Care | Payer: Medicare Other | Source: Ambulatory Visit | Attending: Adult Health | Admitting: Adult Health

## 2012-07-29 DIAGNOSIS — I5030 Unspecified diastolic (congestive) heart failure: Secondary | ICD-10-CM | POA: Diagnosis not present

## 2012-07-29 DIAGNOSIS — R079 Chest pain, unspecified: Secondary | ICD-10-CM | POA: Insufficient documentation

## 2012-07-29 DIAGNOSIS — R0989 Other specified symptoms and signs involving the circulatory and respiratory systems: Secondary | ICD-10-CM | POA: Insufficient documentation

## 2012-07-29 DIAGNOSIS — I10 Essential (primary) hypertension: Secondary | ICD-10-CM | POA: Diagnosis not present

## 2012-07-29 DIAGNOSIS — E119 Type 2 diabetes mellitus without complications: Secondary | ICD-10-CM | POA: Insufficient documentation

## 2012-07-29 DIAGNOSIS — I2589 Other forms of chronic ischemic heart disease: Secondary | ICD-10-CM

## 2012-07-29 DIAGNOSIS — I2510 Atherosclerotic heart disease of native coronary artery without angina pectoris: Secondary | ICD-10-CM

## 2012-07-29 DIAGNOSIS — E785 Hyperlipidemia, unspecified: Secondary | ICD-10-CM | POA: Diagnosis not present

## 2012-07-29 DIAGNOSIS — R0609 Other forms of dyspnea: Secondary | ICD-10-CM | POA: Insufficient documentation

## 2012-07-29 DIAGNOSIS — F17200 Nicotine dependence, unspecified, uncomplicated: Secondary | ICD-10-CM

## 2012-07-29 MED ORDER — SODIUM CHLORIDE 0.9 % IJ SOLN
INTRAMUSCULAR | Status: AC
  Administered 2012-07-29: 10 mL via INTRAVENOUS
  Filled 2012-07-29: qty 10

## 2012-07-29 MED ORDER — TECHNETIUM TC 99M SESTAMIBI - CARDIOLITE
10.0000 | Freq: Once | INTRAVENOUS | Status: AC | PRN
  Administered 2012-07-29: 9.2 via INTRAVENOUS

## 2012-07-29 MED ORDER — TECHNETIUM TC 99M SESTAMIBI - CARDIOLITE
30.0000 | Freq: Once | INTRAVENOUS | Status: AC | PRN
  Administered 2012-07-29: 30 via INTRAVENOUS

## 2012-07-29 MED ORDER — REGADENOSON 0.4 MG/5ML IV SOLN
INTRAVENOUS | Status: AC
  Administered 2012-07-29: 0.4 mg via INTRAVENOUS
  Filled 2012-07-29: qty 5

## 2012-07-29 NOTE — Progress Notes (Signed)
Stress Lab Nurses Notes - [facility]  [name] 07/29/2012 Reason for doing test: Chest Pain and Dyspnea Type of test: Marlane Hatcher Nurse performing test: Parke Poisson, RN Nuclear Medicine Tech: Lyndel Pleasure Echo Tech: Not Applicable MD performing test: Ival Bible & Joni Reining NP Family MD: Dr. Everardo Beals Test explained and consent signed: yes IV started: 22g jelco, Saline lock flushed, No redness or edema and Saline lock started in radiology Symptoms: SOB & Nausea Treatment/Intervention: None Reason test stopped: protocol completed After recovery IV was: Discontinued via X-ray tech and No redness or edema Patient to return to Nuc. Med at : 11:30 Patient discharged: Home Patient's Condition upon discharge was: stable Comments: During test BP 134/79 & HR 112.  Recovery BP 133/76 & HR 98.  Symptoms resolved in recovery. [name]

## 2012-08-01 ENCOUNTER — Encounter: Payer: Self-pay | Admitting: Adult Health

## 2012-08-01 NOTE — Progress Notes (Signed)
No show

## 2012-08-05 NOTE — Assessment & Plan Note (Signed)
BP Readings from Last 4 Encounters:  01/04/12 135/84  01/03/12 139/73  12/26/11 135/78  12/19/11 119/77   BP today is mildly elevated but is still within her goal at this time.  I encouraged her to work on cutting out cigarettes and to take her medications as prescribed.

## 2012-08-05 NOTE — Assessment & Plan Note (Signed)
Lab Results  Component Value Date   HGBA1C 7.0 01/04/2012   HGBA1C 7.3 10/04/2011   Lab Results  Component Value Date   MICROALBUR 0.50 02/05/2012   LDLCALC 60 01/04/2012   Her A1C today is 7 which is at her goal.  We will continue her lantus and metformin for now and I encouraged her to continue working to lose weight as well as making good food choices.

## 2012-08-05 NOTE — Assessment & Plan Note (Signed)
She is scheduled for a colonoscopy in a week to try to find the source of her rectal bleeding.  Her hemoglobin has been stable so we will await the results of the colonoscopy and proceed as recommended from there.

## 2012-08-05 NOTE — Assessment & Plan Note (Signed)
Mood today is worried but she denies SI, HI today.  She states that she has been taking her medications and going to [facility].  I encouraged her to continue her medications as well as her counseling.

## 2012-08-05 NOTE — Assessment & Plan Note (Signed)
Lab Results  Component Value Date   CHOL 126 01/04/2012   CHOL 164 12/27/2010   CHOL 156 09/28/2009   Lab Results  Component Value Date   HDL 29* 01/04/2012   HDL 43 12/27/2010   HDL 28* 09/28/2009   Lab Results  Component Value Date   LDLCALC 60 01/04/2012   LDLCALC 85 12/27/2010   LDLCALC 61 09/28/2009   Lab Results  Component Value Date   TRIG 186* 01/04/2012   TRIG 180* 12/27/2010   TRIG 335* 09/28/2009   Lab Results  Component Value Date   CHOLHDL 4.3 01/04/2012   CHOLHDL 3.8 12/27/2010   CHOLHDL 5.6 Ratio 09/28/2009   No results found for this basename: LDLDIRECT   Lipid panel today shows her LDL is at her goal on her current medications.  I encouraged her to continue her medications as prescribed.

## 2012-08-15 ENCOUNTER — Other Ambulatory Visit: Payer: Self-pay | Admitting: Internal Medicine

## 2012-08-20 DIAGNOSIS — F319 Bipolar disorder, unspecified: Secondary | ICD-10-CM | POA: Diagnosis not present

## 2012-08-25 NOTE — Addendum Note (Signed)
Addended by: Remus Blake on: 08/25/2012 03:29 PM   Modules accepted: Orders

## 2012-08-27 ENCOUNTER — Other Ambulatory Visit: Payer: Self-pay | Admitting: *Deleted

## 2012-08-27 MED ORDER — HYDROCHLOROTHIAZIDE 25 MG PO TABS: 12.5000 mg | ORAL_TABLET | Freq: Every day | ORAL | Status: DC

## 2012-08-27 NOTE — Telephone Encounter (Signed)
Please clarify if pt is supposed to take a half or whole pill.  She thought it was changed to 1/2 a pill but she forgot and has been taking a whole pill.  She states her pharmacy said she is 28 days early for refill.  I don't see the change to 1/2 pill

## 2012-09-16 ENCOUNTER — Ambulatory Visit (HOSPITAL_BASED_OUTPATIENT_CLINIC_OR_DEPARTMENT_OTHER): Payer: Medicare Other | Admitting: Internal Medicine

## 2012-09-16 ENCOUNTER — Encounter: Payer: Self-pay | Admitting: Internal Medicine

## 2012-09-16 VITALS — BP 122/77 | HR 85 | Temp 97.5°F | Resp 20 | Ht 65.5 in | Wt 217.7 lb

## 2012-09-16 DIAGNOSIS — I10 Essential (primary) hypertension: Secondary | ICD-10-CM

## 2012-09-16 DIAGNOSIS — E119 Type 2 diabetes mellitus without complications: Secondary | ICD-10-CM | POA: Diagnosis not present

## 2012-09-16 DIAGNOSIS — F17200 Nicotine dependence, unspecified, uncomplicated: Secondary | ICD-10-CM | POA: Diagnosis not present

## 2012-09-16 DIAGNOSIS — R42 Dizziness and giddiness: Secondary | ICD-10-CM | POA: Insufficient documentation

## 2012-09-16 LAB — CBC WITH DIFFERENTIAL/PLATELET
Basophils Absolute: 0 10*3/uL (ref 0.0–0.1)
Basophils Relative: 0 % (ref 0–1)
HCT: 36.4 % (ref 36.0–46.0)
Lymphocytes Relative: 43 % (ref 12–46)
MCHC: 32.7 g/dL (ref 30.0–36.0)
Monocytes Absolute: 0.4 10*3/uL (ref 0.1–1.0)
Neutro Abs: 4.3 10*3/uL (ref 1.7–7.7)
Neutrophils Relative %: 50 % (ref 43–77)
RDW: 15.7 % — ABNORMAL HIGH (ref 11.5–15.5)
WBC: 8.5 10*3/uL (ref 4.0–10.5)

## 2012-09-16 LAB — POCT GLYCOSYLATED HEMOGLOBIN (HGB A1C): Hemoglobin A1C: 8.5

## 2012-09-16 LAB — GLUCOSE, CAPILLARY: Glucose-Capillary: 291 mg/dL — ABNORMAL HIGH (ref 70–99)

## 2012-09-16 MED ORDER — INSULIN GLARGINE 100 UNIT/ML SOLOSTAR PEN: PEN_INJECTOR | SUBCUTANEOUS | Status: DC

## 2012-09-16 NOTE — Progress Notes (Signed)
HPI The patient is a 47 y.o. female with a history of DM, bipolar disorder, HTN, tobacco abuse, presenting for a follow-up visit.  The patient notes symptoms of lightheadedness, present for about 1 week.  Symptoms occur at any time of day, with any position, not worsened when standing from a seated position.  She notes mildly decreased PO intake lately, though she notes no change in appetite, and no nausea or abdominal pain.    The patient has a history of DM.  Her last A1C was 8.2, though it has increased today to 8.5, with a CBG of 291.  The patient did not bring her glucometer.  She reports that her blood sugars have been mostly in the 100-150's, with a range 120-200's.  She notes no recent episodes of hypoglycemia.  The patient had previously quit smoking, but has now restarted smoking, due to stress in her life.  She is currently smoking 1 ppd.  The patient is interested in quitting.  She has tried the nicotine patch and gum in the past, with some success.  The patient is interested in talking to CSW regarding smoking cessation tactics.  ROS: General: no fevers, chills, changes in weight, changes in appetite Skin: no rash HEENT: no blurry vision, hearing changes, sore throat Pulm: no dyspnea, coughing, wheezing CV: no chest pain, palpitations, shortness of breath Abd: no abdominal pain, nausea/vomiting, diarrhea/constipation GU: no dysuria, hematuria, polyuria Ext: no arthralgias, myalgias Neuro: no weakness, numbness, or tingling  Filed Vitals:   09/16/12 1447  BP: 122/77  Pulse: 85  Temp: 97.5 F (36.4 C)  Resp: 20  Orthostatic Vitals: Sitting: 122/83 Standing: 128/91  PEX General: alert, cooperative, and in no apparent distress HEENT: pupils equal round and reactive to light, vision grossly intact, oropharynx clear and non-erythematous  Neck: supple, no lymphadenopathy Lungs: clear to ascultation bilaterally, normal work of respiration, no wheezes, rales, ronchi Heart:  regular rate and rhythm, no murmurs, gallops, or rubs Abdomen: soft, non-tender, non-distended, normal bowel sounds Extremities: no cyanosis, clubbing, or edema Neurologic: alert & oriented X3, cranial nerves II-XII intact, strength 5/5 throughout, sensation intact to light touch, finger-to-nose test intact  Current Outpatient Prescriptions on File Prior to Visit  Medication Sig Dispense Refill  . albuterol (PROVENTIL HFA;VENTOLIN HFA) 108 (90 BASE) MCG/ACT inhaler Inhale 2 puffs into the lungs every 6 (six) hours as needed for wheezing or shortness of breath.  1 Inhaler  2  . cetirizine (ZYRTEC) 10 MG tablet Take 10 mg by mouth daily.      . divalproex (DEPAKOTE ER) 500 MG 24 hr tablet Take 1 tablet (500 mg total) by mouth at bedtime.  30 tablet  0  . divalproex (DEPAKOTE) 250 MG DR tablet Take 1 tablet (250 mg total) by mouth every morning.  30 tablet  0  . gabapentin (NEURONTIN) 300 MG capsule Take 1 capsule (300 mg total) by mouth 3 (three) times daily.  90 capsule  0  . hydrochlorothiazide (HYDRODIURIL) 25 MG tablet Take 0.5 tablets (12.5 mg total) by mouth daily.  30 tablet  3  . ibuprofen (ADVIL,MOTRIN) 200 MG tablet Take 400 mg by mouth every 8 (eight) hours as needed. Aches and pains, fever      . LANTUS SOLOSTAR 100 UNIT/ML SOPN INJECT 14 UNITS SUBCUTANEOUSLY AT BEDTIME.  15 mL  11  . lovastatin (MEVACOR) 20 MG tablet Take 1 tablet (20 mg total) by mouth at bedtime.  90 tablet  1  . magnesium oxide (MAG-OX 400) 400  MG tablet Take 1 tablet (400 mg total) by mouth 2 (two) times daily.  6 tablet  0  . metFORMIN (GLUCOPHAGE) 1000 MG tablet Take 1,000 mg by mouth 2 (two) times daily.      . metoprolol (LOPRESSOR) 50 MG tablet TAKE 1 TABLET BY MOUTH TWICE DAILY.  60 tablet  5  . mirtazapine (REMERON) 15 MG tablet Take 15 mg by mouth at bedtime.      Marland Kitchen NOVOFINE 32G X 6 MM MISC       . ondansetron (ZOFRAN ODT) 8 MG disintegrating tablet Take 1 tablet (8 mg total) by mouth every 8 (eight) hours  as needed for nausea.  20 tablet  0  . pantoprazole (PROTONIX) 40 MG tablet Take 1 tablet (40 mg total) by mouth 2 (two) times daily.  30 tablet  0  . traZODone (DESYREL) 100 MG tablet Take 100 mg by mouth at bedtime.      . ziprasidone (GEODON) 80 MG capsule Take 1 capsule (80 mg total) by mouth 2 (two) times daily with a meal.  60 capsule  0   No current facility-administered medications on file prior to visit.    Assessment/Plan

## 2012-09-16 NOTE — Patient Instructions (Addendum)
General Instructions: For your Diabetes, we are increasing your Lantus insulin to 22 units daily  Your lightheadedness may be a side effect of your medications. We are checking some blood levels that may be a cause of your dizziness.  Please discontinue the following medications: -Zyrtec, trazodone, gabapentin, remeron  We have sent a referral to our social work to discuss smoking cessation.  Treatment Goals:  Goals (1 Years of Data) as of 09/16/12         As of Today 07/21/12 07/13/12 07/13/12 06/12/12     Blood Pressure    . Blood Pressure < 140/90  122/77 120/70 145/83 140/98 135/82     Result Component    . HEMOGLOBIN A1C < 7.0  8.5        . LDL CALC < 100            Progress Toward Treatment Goals:  Treatment Goal 09/16/2012  Hemoglobin A1C unchanged  Blood pressure at goal  Stop smoking smoking the same amount    Self Care Goals & Plans:  Self Care Goal 06/12/2012  Manage my medications take my medicines as prescribed; bring my medications to every visit; refill my medications on time  Monitor my health keep track of my blood glucose  Eat healthy foods eat smaller portions  Be physically active find time in my schedule    Home Blood Glucose Monitoring 09/16/2012  Check my blood sugar once a day  When to check my blood sugar before meals     Care Management & Community Referrals:  Referral 09/16/2012  Referrals made for care management support social worker

## 2012-09-16 NOTE — Assessment & Plan Note (Signed)
The patient notes that she has had successful quit attempts in the past, though she is currently smoking due to stress.  She has had success with NRT's in the past.  Bupropion and Chantix probably aren't best for her, given her bipolar disorder.  She is interested in re-trying nicotine patches, which she states she still has at her house.  She is also interested in talking to CSW about smoking cessation -continue nicotine patches -referral made to CSW

## 2012-09-16 NOTE — Assessment & Plan Note (Signed)
The patient notes symptoms of lightheadedness.  Orthostatic vitals showed no orthostatic hypotension.  Differential includes mild volume depletion (patient notes poor PO intake) vs medication induced (depakote toxicity vs zyrtec vs gabapentin vs trazodone vs remeron) vs anemia (history of iron deficiency anemia) -check BMET, CBC, depakote level -stop zyrtec, gabapentin, trazodone, remeron

## 2012-09-16 NOTE — Assessment & Plan Note (Signed)
BP Readings from Last 3 Encounters:  09/16/12 122/77  07/21/12 120/70  07/13/12 145/83    Lab Results  Component Value Date   NA 135 07/13/2012   K 3.6 07/13/2012   CREATININE 0.91 07/13/2012    Assessment: Blood pressure control: controlled Progress toward BP goal:  at goal Comments: Well-controlled  Plan: Medications:  continue current medications Educational resources provided: brochure Self management tools provided: home blood pressure logbook Other plans: none

## 2012-09-16 NOTE — Assessment & Plan Note (Signed)
Lab Results  Component Value Date   HGBA1C 8.5 09/16/2012   HGBA1C 8.2* 04/20/2012   HGBA1C 7.0 01/04/2012     Assessment: Diabetes control: poor control (HgbA1C >9%) Progress toward A1C goal:  unchanged Comments: The patient's A1C has increased to 8.5, inconsistent with her home reported CBG's  Plan: Medications:  Increase Lantus from 18 to 22 units daily Home glucose monitoring: Frequency: once a day Timing: before meals Instruction/counseling given: reminded to bring blood glucose meter & log to each visit Educational resources provided: brochure Self management tools provided: home glucose logbook Other plans: Eye appointment scheduled for [facility] by RN.

## 2012-09-17 ENCOUNTER — Telehealth: Payer: Self-pay | Admitting: *Deleted

## 2012-09-17 LAB — BASIC METABOLIC PANEL
BUN: 13 mg/dL (ref 6–23)
Chloride: 100 mEq/L (ref 96–112)
Potassium: 4.2 mEq/L (ref 3.5–5.3)
Sodium: 134 mEq/L — ABNORMAL LOW (ref 135–145)

## 2012-09-17 LAB — VALPROIC ACID LEVEL: Valproic Acid Lvl: 33 ug/mL — ABNORMAL LOW (ref 50.0–100.0)

## 2012-09-17 NOTE — Telephone Encounter (Signed)
I returned the patient's phone call.  The patient was seen yesterday in clinic for lightheadedness.  Several labs were checked, including hemoglobin and valproic acid level, and these were normal.  The patient was also on several sedating medications, which were stopped.  Symptoms are unchanged.  The patient was reassured that lab values were unremarkable.  We discussed that some of her sedating meds may take a while to wear off, so it may take more than 1 day for symptoms to resolve.  Will follow-up at future visits.  Patient encouraged to call our clinic next week if symptoms were unchanged.

## 2012-09-17 NOTE — Telephone Encounter (Signed)
Pt calls continues to feel very bad, would like for you to call her w/ results and discussion at [phone]

## 2012-09-19 NOTE — Progress Notes (Signed)
Case discussed with Dr. Brown (at time of visit, soon after the resident saw the patient).  We reviewed the resident's history and exam and pertinent patient test results.  I agree with the assessment, diagnosis, and plan of care documented in the resident's note. 

## 2012-10-07 ENCOUNTER — Telehealth: Payer: Self-pay | Admitting: Licensed Clinical Social Worker

## 2012-10-07 NOTE — Telephone Encounter (Signed)
[name] was referred to CSW for additional smoking cessation tactics.  CSW placed called to pt.  CSW left message requesting return call. CSW provided contact hours and phone number.

## 2012-10-09 NOTE — Telephone Encounter (Signed)
[name] returned call to CSW, left voicemail.  CSW placed call to pt and received voicemail again.  CSW left information stating CSW can discuss techniques over the phone.  Pt returned call.  Pt states she has tried NRT (patches, gum and lozenge) and Quitline Coach but they have not worked.  Pt currently smokes Newports.  CSW discussed Nicotine brand switching and tapering.  Pt open to trying.  CSW will send [name] information on nicotine tapering/brand switching as well as other techniques.  Pt agreeable.

## 2012-10-09 NOTE — Telephone Encounter (Signed)
CSW placed called to pt.  CSW left message requesting return call. CSW provided contact hours and phone number. 

## 2012-10-10 ENCOUNTER — Other Ambulatory Visit: Payer: Self-pay | Admitting: Gastroenterology

## 2012-10-10 ENCOUNTER — Other Ambulatory Visit: Payer: Self-pay | Admitting: Internal Medicine

## 2012-10-15 ENCOUNTER — Ambulatory Visit (INDEPENDENT_AMBULATORY_CARE_PROVIDER_SITE_OTHER): Payer: Medicare Other | Admitting: Internal Medicine

## 2012-10-15 ENCOUNTER — Encounter: Payer: Self-pay | Admitting: Internal Medicine

## 2012-10-15 VITALS — BP 127/84 | HR 98 | Temp 97.2°F | Wt 211.3 lb

## 2012-10-15 DIAGNOSIS — K219 Gastro-esophageal reflux disease without esophagitis: Secondary | ICD-10-CM | POA: Diagnosis not present

## 2012-10-15 DIAGNOSIS — R42 Dizziness and giddiness: Secondary | ICD-10-CM

## 2012-10-15 DIAGNOSIS — E785 Hyperlipidemia, unspecified: Secondary | ICD-10-CM | POA: Diagnosis not present

## 2012-10-15 DIAGNOSIS — Z23 Encounter for immunization: Secondary | ICD-10-CM | POA: Diagnosis not present

## 2012-10-15 DIAGNOSIS — I2789 Other specified pulmonary heart diseases: Secondary | ICD-10-CM | POA: Diagnosis not present

## 2012-10-15 DIAGNOSIS — Z0000 Encounter for general adult medical examination without abnormal findings: Secondary | ICD-10-CM | POA: Diagnosis not present

## 2012-10-15 DIAGNOSIS — I10 Essential (primary) hypertension: Secondary | ICD-10-CM | POA: Diagnosis not present

## 2012-10-15 DIAGNOSIS — F17200 Nicotine dependence, unspecified, uncomplicated: Secondary | ICD-10-CM | POA: Diagnosis not present

## 2012-10-15 DIAGNOSIS — E119 Type 2 diabetes mellitus without complications: Secondary | ICD-10-CM

## 2012-10-15 DIAGNOSIS — I272 Pulmonary hypertension, unspecified: Secondary | ICD-10-CM

## 2012-10-15 MED ORDER — INSULIN GLARGINE 100 UNIT/ML SOLOSTAR PEN: PEN_INJECTOR | SUBCUTANEOUS | Status: DC

## 2012-10-15 MED ORDER — HYDROCHLOROTHIAZIDE 25 MG PO TABS: 25.0000 mg | ORAL_TABLET | Freq: Every day | ORAL | Status: DC

## 2012-10-15 MED ORDER — METOPROLOL TARTRATE 50 MG PO TABS: ORAL_TABLET | ORAL | Status: DC

## 2012-10-15 MED ORDER — METFORMIN HCL 1000 MG PO TABS: 1000.0000 mg | ORAL_TABLET | Freq: Two times a day (BID) | ORAL | Status: DC

## 2012-10-15 MED ORDER — ESOMEPRAZOLE MAGNESIUM 20 MG PO CPDR: 20.0000 mg | DELAYED_RELEASE_CAPSULE | Freq: Every day | ORAL | Status: DC

## 2012-10-15 MED ORDER — ALBUTEROL SULFATE HFA 108 (90 BASE) MCG/ACT IN AERS: 2.0000 | INHALATION_SPRAY | Freq: Four times a day (QID) | RESPIRATORY_TRACT | Status: DC | PRN

## 2012-10-15 MED ORDER — LOVASTATIN 20 MG PO TABS
ORAL_TABLET | ORAL | Status: DC
Start: 2012-10-15 — End: 2013-01-23

## 2012-10-15 NOTE — Assessment & Plan Note (Signed)
BP Readings from Last 3 Encounters:  10/15/12 127/84  09/16/12 122/77  07/21/12 120/70    Lab Results  Component Value Date   NA 134* 09/16/2012   K 4.2 09/16/2012   CREATININE 0.86 09/16/2012    Assessment: Blood pressure control: controlled Progress toward BP goal:  at goal  Plan: Medications:  continue current medications - HCTZ 25 (has been taking full pill even after dose was cut in half on 6/3) and metoprolol 50 BID; eventually may consider titrating off BB and adding ACEI given DM (no proteinuria on last microalb) Educational resources provided: brochure Self management tools provided: home blood pressure logbook

## 2012-10-15 NOTE — Patient Instructions (Signed)
General Instructions: -Today, we gave you your pneumonia shot, you will not need another one until you turn 67.  You will need a flu shot again this year in the fall.  -Please be sure to have the ultrasound of your heart repeated  -Please be sure to have records from your eye exam sent to [name] -Continue taking all of your medications as you are -You are doing great with your weight loss, better diet & exercise!  Keep it up!!!!  Please be sure to bring all of your medications with you to every visit.  Should you have any new or worsening symptoms, please be sure to call the clinic at [phone].   Treatment Goals:  Goals (1 Years of Data) as of 10/15/12         As of Today 09/16/12 07/21/12 07/13/12 07/13/12     Blood Pressure    . Blood Pressure < 140/90  127/84 122/77 120/70 145/83 140/98     Result Component    . HEMOGLOBIN A1C < 7.0   8.5       . LDL CALC < 100            Progress Toward Treatment Goals:  Treatment Goal 10/15/2012  Hemoglobin A1C improved  Blood pressure at goal  Stop smoking smoking less    Self Care Goals & Plans:  Self Care Goal 10/15/2012  Manage my medications take my medicines as prescribed; refill my medications on time  Monitor my health keep track of my blood glucose; keep track of my weight; check my feet daily  Eat healthy foods eat baked foods instead of fried foods; eat foods that are low in salt  Be physically active find workout friends  Stop smoking cut down the number of cigarettes smoked    Home Blood Glucose Monitoring 10/15/2012  Check my blood sugar 2 times a day  When to check my blood sugar before breakfast; at bedtime     Care Management & Community Referrals:  Referral 10/15/2012  Referrals made for care management support none needed  Referrals made to community resources none

## 2012-10-15 NOTE — Assessment & Plan Note (Signed)
Continues to smoke,but reduced amt (0.5 PPD, max was 2PPD), counseled on cessation.

## 2012-10-15 NOTE — Assessment & Plan Note (Signed)
Resolved with discontinuation of trazodone, zyrtec, gabapentin & remeron.

## 2012-10-15 NOTE — Progress Notes (Signed)
Subjective:   Patient ID: [name] female   DOB: 1966-04-08 47 y.o.   MRN: [id number]  HPI: [name] is a 47 y.o. woman with h/o grade 2 dCHF and pulmonary HTN who presents for routine follow up.  I last saw pt in January, but she was most recently seen in clinic on 09/16/12, at which visit her lantus was increased from 18 --> 22u daily. At last visit, she also c/o lightheadedness, and zyrtec, gabapentin, trazodone & remeron were discontinued.  Doing better since last visit, no further light headedness.   She has h/o pulm HTN determined on echo in 04/2012 after hospitalization and intubation for HMPV pneumonia, and has not had a repeat echo to re-evaluate pulm HTN since then.  Decreased smoking 0.5 PPD from 1 PPD Started exercising, and increased coughing (no sputum) with exercise (zumba 4x/week, walking early AM daily)   HCTZ 25 mg daily, even though decreased to 12.5mg  at last visit. Lantus 22u qHS --> AM 107-116, checks CBGs daily.  No s/s of hypoglycemia (such as SOB, diaphoresis, palpitations)  Has been following at [facility] with nurse practitioner.  H/o colonoscopy last year by Dr. Jena Gauss appt with [facility] this month  Review of Systems: Constitutional: Denies fever, chills, diaphoresis, appetite change and fatigue.  HEENT: Denies photophobia, eye pain, redness, hearing loss, ear pain, congestion, sore throat, rhinorrhea, sneezing, mouth sores, trouble swallowing, neck pain, neck stiffness and tinnitus.  Respiratory: Denies chest tightness and wheezing.  Cardiovascular: Denies palpitations and leg swelling.  Gastrointestinal: Denies nausea, vomiting, abdominal pain, diarrhea (just increased BM in setting of eating more fiber), constipation,blood in stool and abdominal distention.  Genitourinary: Denies dysuria, urgency, frequency, hematuria, flank pain and difficulty urinating.  Musculoskeletal: Denies myalgias (except after working out), back pain,  joint swelling, arthralgias and gait problem.  Skin: Denies pallor, rash and wound.  Neurological: Denies dizziness, seizures, syncope, weakness, lightheadedness, numbness and headaches.    Past Medical History  Diagnosis Date  . Arteriosclerotic cardiovascular disease (ASCVD)     Minimal at cath in [facility].stress nuclear study in 8/08 with nl EF; neg stress echo in 2010  . Diabetes mellitus, type 2 2000    Onset in 2000; no insulin  . Hyperlipidemia   . Hypertension `    during treatment with Geodon  . Gastroesophageal reflux disease     Schatzki's ring  . Anemia, iron deficiency   . Alcohol abuse   . Depression   . Community acquired pneumonia 01/03/10, 05/2010, 04/2012    2011; with pleural effusion-[facility] acute resp failure; intubated in Jan 2014 (HMPV pneumonia)  . Obesity   . Schizoaffective disorder     requiring multiple psychiatric admissions  . Dysphagia   . Diastolic dysfunction     grade 2 per echo 2011  . Pulmonary hypertension 05/02/2012    Patient needs repeat echo in 06/2012   . History of alcohol abuse 07/22/2007    Qualifier: Diagnosis of  By: Rudene Anda     Current Outpatient Prescriptions  Medication Sig Dispense Refill  . albuterol (PROVENTIL HFA;VENTOLIN HFA) 108 (90 BASE) MCG/ACT inhaler Inhale 2 puffs into the lungs every 6 (six) hours as needed for wheezing or shortness of breath.  1 Inhaler  2  . cetirizine (ZYRTEC) 10 MG tablet Take 10 mg by mouth daily.      . divalproex (DEPAKOTE ER) 500 MG 24 hr tablet Take 1 tablet (500 mg total) by mouth at bedtime.  30 tablet  0  . hydrochlorothiazide (HYDRODIURIL) 25 MG tablet Take 0.5 tablets (12.5 mg total) by mouth daily.  30 tablet  3  . ibuprofen (ADVIL,MOTRIN) 200 MG tablet Take 400 mg by mouth every 8 (eight) hours as needed. Aches and pains, fever      . Insulin Glargine (LANTUS SOLOSTAR) 100 UNIT/ML SOPN INJECT 22 UNITS SUBCUTANEOUSLY AT BEDTIME.  15 mL  11  . lovastatin (MEVACOR) 20 MG  tablet TAKE (1) TABLET BY MOUTH AT BEDTIME.  90 tablet  2  . magnesium oxide (MAG-OX 400) 400 MG tablet Take 1 tablet (400 mg total) by mouth 2 (two) times daily.  6 tablet  0  . metFORMIN (GLUCOPHAGE) 1000 MG tablet Take 1,000 mg by mouth 2 (two) times daily.      . metoprolol (LOPRESSOR) 50 MG tablet TAKE 1 TABLET BY MOUTH TWICE DAILY.  60 tablet  5  . NOVOFINE 32G X 6 MM MISC       . ondansetron (ZOFRAN ODT) 8 MG disintegrating tablet Take 1 tablet (8 mg total) by mouth every 8 (eight) hours as needed for nausea.  20 tablet  0  . pantoprazole (PROTONIX) 40 MG tablet TAKE 1 TABLET BY MOUTH TWICE DAILY 30 MINUTES PRIOR TO MEALS.  62 tablet  3  . traZODone (DESYREL) 100 MG tablet Take 100 mg by mouth at bedtime.      . ziprasidone (GEODON) 80 MG capsule Take 1 capsule (80 mg total) by mouth 2 (two) times daily with a meal.  60 capsule  0   No current facility-administered medications for this visit.   Family History  Problem Relation Age of Onset  . Colon cancer Other   . Hypertension Mother   . Stroke Father     deceased at age 66  . Heart disease Sister   . Anesthesia problems Neg Hx   . Hypotension Neg Hx   . Malignant hyperthermia Neg Hx   . Pseudochol deficiency Neg Hx    History   Social History  . Marital Status: Single    Spouse Name: N/A    Number of Children: N/A  . Years of Education: 12   Occupational History  . Disability   . UNEMPLOYED    Social History Main Topics  . Smoking status: Current Every Day Smoker -- 1.00 packs/day for 30 years    Types: Cigarettes    Start date: 05/18/2012  . Smokeless tobacco: Not on file     Comment: Sometimes she smokes more; up to 2 packs/day., currently trying to quit (September 2013).  . Alcohol Use: No     Comment: occasional; intake has decreased since she found out she has ulcer, no significant alcohol since April 2013.  . Drug Use: No  . Sexually Active: Not Currently -- Female partner(s)    Birth Control/ Protection:  None, Inserts   Other Topics Concern  . 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); On disability (depression qualifies); Graduated high school in [place] and some community college in [place]    Objective:  Physical Exam: Filed Vitals:   10/15/12 1405  BP: 127/84  Pulse: 98  Temp: 97.2 F (36.2 C)  TempSrc: Oral  Weight: 211 lb 4.8 oz (95.845 kg)  SpO2: 98%   General: comfortable, sitting in chair HEENT: PERRL, EOMI, no scleral icterus Cardiac:  RRR, no rubs, murmurs or gallops Pulm: clear to auscultation bilaterally, moving normal volumes of air Abd: soft, nontender, nondistended, BS normoactive Ext: warm and well perfused, no pedal edema, b/l feet with hyperpigmented callus (esp on heels) Neuro: alert and oriented X3, cranial nerves II-XII grossly intact  Assessment & Plan:   Case and care discussed with Dr. Meredith Pel.  Please see problem oriented charting for further details. Patient to return in 2 months for DM follow up.

## 2012-10-15 NOTE — Assessment & Plan Note (Signed)
Refilled lovastatin 

## 2012-10-15 NOTE — Assessment & Plan Note (Signed)
Lab Results  Component Value Date   HGBA1C 8.5 09/16/2012   HGBA1C 8.2* 04/20/2012   HGBA1C 7.0 01/04/2012     Assessment: Diabetes control: fair control Progress toward A1C goal:  improved - based on recent CBGs Comments: only checks AM CBGs  Plan: Medications:  continue current medications - lantus 22 qHS & metformin 1000 bid Home glucose monitoring: Frequency: 2 times a day Timing: before breakfast;at bedtime Instruction/counseling given: reminded to get eye exam Educational resources provided: brochure Self management tools provided: home glucose logbook Other plans: Repeat A1c in Sept 2014

## 2012-10-15 NOTE — Assessment & Plan Note (Signed)
Appt with Dr. Vassie Loll on 10/27/12.  Will repeat echo to re-evaluate pulm HTN as suggested at hospital discharge in January (in setting of PNA requiring intubation).  Patient has significant smoking history and may require PFTs as further w/u.

## 2012-10-15 NOTE — Assessment & Plan Note (Signed)
Pneumovax today 

## 2012-10-15 NOTE — Assessment & Plan Note (Signed)
Refilled PPI (nexium instead of protonix since insurance will cover)

## 2012-10-16 ENCOUNTER — Telehealth: Payer: Self-pay | Admitting: *Deleted

## 2012-10-16 NOTE — Telephone Encounter (Signed)
Can [name] reschedule her echo??  (assuming it's the echo scheduled yesterday)

## 2012-10-16 NOTE — Telephone Encounter (Signed)
Contacted pt to reschedule echo, but pt wanted letter to be excused from [employer] as she forgot to mention this at her 07/02 OV with DrSharda.  Pt stated that she will go ahead and go to [employer] on Monday and tell them about her Echo scheduled for that same day to see if they will excuse her.  If not pt will call back to reschedule echo appt herself.Rachel Spittle Cassady7/3/20144:46 PM

## 2012-10-16 NOTE — Progress Notes (Signed)
Case discussed with Dr. Sharda soon after the resident saw the patient.  We reviewed the resident's history and exam and pertinent patient test results.  I agree with the assessment, diagnosis, and plan of care documented in the resident's note. 

## 2012-10-16 NOTE — Telephone Encounter (Signed)
Pt called clinic stating she has a lung test to do Monday and also she is to report to [employer] on Monday. She wants an excuse from [place] duty on 7/7. She wants it faxed to [phone] She has an appointment for ultra sound of lung on Monday - per patient  Will you write the excuse?

## 2012-10-20 ENCOUNTER — Ambulatory Visit (HOSPITAL_COMMUNITY)
Admission: RE | Admit: 2012-10-20 | Discharge: 2012-10-20 | Disposition: A | Discharge status: Home / Self Care | Payer: Medicare Other | Source: Ambulatory Visit | Attending: Internal Medicine | Admitting: Internal Medicine

## 2012-10-20 DIAGNOSIS — K219 Gastro-esophageal reflux disease without esophagitis: Secondary | ICD-10-CM | POA: Diagnosis not present

## 2012-10-20 DIAGNOSIS — I509 Heart failure, unspecified: Secondary | ICD-10-CM | POA: Insufficient documentation

## 2012-10-20 DIAGNOSIS — I2789 Other specified pulmonary heart diseases: Secondary | ICD-10-CM | POA: Diagnosis not present

## 2012-10-20 DIAGNOSIS — R0602 Shortness of breath: Secondary | ICD-10-CM

## 2012-10-20 DIAGNOSIS — I079 Rheumatic tricuspid valve disease, unspecified: Secondary | ICD-10-CM | POA: Diagnosis not present

## 2012-10-20 DIAGNOSIS — R0989 Other specified symptoms and signs involving the circulatory and respiratory systems: Secondary | ICD-10-CM | POA: Insufficient documentation

## 2012-10-20 DIAGNOSIS — I272 Pulmonary hypertension, unspecified: Secondary | ICD-10-CM

## 2012-10-20 DIAGNOSIS — E119 Type 2 diabetes mellitus without complications: Secondary | ICD-10-CM | POA: Insufficient documentation

## 2012-10-20 DIAGNOSIS — I2510 Atherosclerotic heart disease of native coronary artery without angina pectoris: Secondary | ICD-10-CM | POA: Diagnosis not present

## 2012-10-20 DIAGNOSIS — J449 Chronic obstructive pulmonary disease, unspecified: Secondary | ICD-10-CM | POA: Insufficient documentation

## 2012-10-20 DIAGNOSIS — E669 Obesity, unspecified: Secondary | ICD-10-CM | POA: Diagnosis not present

## 2012-10-20 DIAGNOSIS — E785 Hyperlipidemia, unspecified: Secondary | ICD-10-CM | POA: Diagnosis not present

## 2012-10-20 DIAGNOSIS — R0609 Other forms of dyspnea: Secondary | ICD-10-CM | POA: Insufficient documentation

## 2012-10-20 DIAGNOSIS — I7090 Unspecified atherosclerosis: Secondary | ICD-10-CM | POA: Diagnosis not present

## 2012-10-20 NOTE — Progress Notes (Signed)
  Echocardiogram 2D Echocardiogram has been performed.  Jorje Guild 10/20/2012, 2:41 PM

## 2012-10-21 ENCOUNTER — Telehealth: Payer: Self-pay

## 2012-10-21 MED ORDER — HYDROCORTISONE 2.5 % RE CREA: TOPICAL_CREAM | Freq: Two times a day (BID) | RECTAL | Status: DC

## 2012-10-21 NOTE — Telephone Encounter (Signed)
Pt aware and will call back if she continues to have problems.

## 2012-10-21 NOTE — Telephone Encounter (Signed)
Pt called- she just noticed some blood in the water when she has a BM and blood with wiping. She said it is more blood than it was last time this happened. She is not constipated, she is having frequent bm's and they are soft. She said she can feel the hemorrhoids sticking out. Pt wants to know if we can send in an rx for her?

## 2012-10-21 NOTE — Telephone Encounter (Signed)
Likely hemorrhoidal. RX sent. If going heavy bleeding, please call us back or go to ER.

## 2012-10-23 ENCOUNTER — Other Ambulatory Visit: Payer: Self-pay

## 2012-10-27 ENCOUNTER — Encounter: Payer: Self-pay | Admitting: Pulmonary Disease

## 2012-10-27 ENCOUNTER — Ambulatory Visit (INDEPENDENT_AMBULATORY_CARE_PROVIDER_SITE_OTHER): Payer: Medicare Other | Admitting: Pulmonary Disease

## 2012-10-27 VITALS — BP 120/72 | HR 89 | Temp 98.5°F | Ht 64.0 in | Wt 212.0 lb

## 2012-10-27 DIAGNOSIS — R0602 Shortness of breath: Secondary | ICD-10-CM

## 2012-10-27 DIAGNOSIS — I2789 Other specified pulmonary heart diseases: Secondary | ICD-10-CM | POA: Diagnosis not present

## 2012-10-27 DIAGNOSIS — F17200 Nicotine dependence, unspecified, uncomplicated: Secondary | ICD-10-CM | POA: Diagnosis not present

## 2012-10-27 DIAGNOSIS — I272 Pulmonary hypertension, unspecified: Secondary | ICD-10-CM

## 2012-10-27 MED ORDER — ALBUTEROL SULFATE HFA 108 (90 BASE) MCG/ACT IN AERS: 2.0000 | INHALATION_SPRAY | Freq: Three times a day (TID) | RESPIRATORY_TRACT | Status: DC | PRN

## 2012-10-27 MED ORDER — NICOTINE 10 MG IN INHA: 1.0000 | RESPIRATORY_TRACT | Status: DC | PRN

## 2012-10-27 NOTE — Progress Notes (Signed)
Subjective:    Patient ID: [name], female    DOB: June 26, 1965, 47 y.o.   MRN: [id number]  HPI  47 y.o.bipolar smoker referred for evaluation of dyspnea She has a PMHX of DM2 (A1c 8.2), HLD, bipolar disorder, schizoaffective disorder, and alcohol abuse. Pt last seen 01/2010. former [facility] pt. >>dyspnea was attributed to deconditioning and anxiety   Pt c/o SOB at rest and with activity. occasional cough. No wheezing and she denies orthopnea or paroxysmal nocturnal dyspnea or pedal edema. Occasional will have sharp pains R side of chest. She denies seasonal allergies, nasal stuffiness or childhood history of asthma. She was admitted to [facility] on 04/28/2012 on transfer from [facility] (admitted on 04/20/2012) with severe CAP in setting of acute respiratory failure requiring intubation on 04/20/2012 X 1 week. Resp virus panel positive for human metapneumovirus 04/21/12 - TTE >> LVEF 60-65%, estimated PASP Rpt echo 6/14 nml LVEF, RVSP 37  Chest x-ray on 05/13/12 was normal. She is a low risk Myoview study on 4/14 Spirometry showed mild airway obstruction with FEV1 of 73% FVC of 90% and ratio 67. Smaller airways were moderately decreased at 40%. Bronchodilator testing was not performed.  She smokes about a pack per day and has been able to quit for a month on prior occasions however feels that it is very difficult this time. She has gained weight in the last 2 years and states that she is lost 8 pounds in the last few months with exercise. [facility]. At her PCP visit,she  c/o lightheadedness, and zyrtec, gabapentin, trazodone & remeron were discontinued.      Past Medical History  Diagnosis Date  . Arteriosclerotic cardiovascular disease (ASCVD)     Minimal at cath in [facility].stress nuclear study in 8/08 with nl EF; neg stress echo in 2010  . Diabetes mellitus, type 2 2000    Onset in 2000; no insulin  . Hyperlipidemia   . Hypertension  `    during treatment with Geodon  . Gastroesophageal reflux disease     Schatzki's ring  . Anemia, iron deficiency   . Alcohol abuse   . Depression   . Community acquired pneumonia 01/03/10, 05/2010, 04/2012    2011; with pleural effusion-[facility] acute resp failure; intubated in Jan 2014 (HMPV pneumonia)  . Obesity   . Schizoaffective disorder     requiring multiple psychiatric admissions  . Dysphagia   . Diastolic dysfunction     grade 2 per echo 2011  . Pulmonary hypertension 05/02/2012    Patient needs repeat echo in 06/2012   . History of alcohol abuse 07/22/2007    Qualifier: Diagnosis of  By: Rudene Anda      Past Surgical History  Procedure Laterality Date  . Dilation and curettage, diagnostic / therapeutic  1992  . Esophagogastroduodenoscopy  09/16/08    Dr. Ronni Rumble hiatal hernia/excoriations involving the cardia and mucosa consistent with trauma, antral erosions  of linear petechiae ? gastritis versus early gastric antral vascular  ectasia.Marland Kitchen biopsy showed reactive gastropathy. No H. pylori.  . Esophagogastroduodenoscopy  09/2007    Dr. Rinaldo Ratel ring, dilated to 56 French Maloney dilator, small hiatal hernia, antral erosions, biopsies reactive gastropathy.  Gaspar Bidding dilation  07/17/2011    [facility] sedation-->distal esophageal stricture s/p dilation, chronic gastritis, multiple ulcers in stomach. no h.pylori  . Colonoscopy  01/2006    internal hemorrhoids  . Colonoscopy  01/10/2012    Procedure: COLONOSCOPY;  Surgeon: Corbin Ade, MD;  Location: [facility];  Service: Endoscopy;  Laterality: N/A;  entered cecum @ 0851 ; total cecal withdrawal time = 8 minutes   Allergies  Allergen Reactions  . Metronidazole Shortness Of Breath and Swelling  . Orange Itching  . Shrimp (Shellfish Allergy) Shortness Of Breath and Itching    Takes Benadryl before eating shrimp.  Marland Kitchen Penicillins Hives and Swelling    Fever as well  . Sulfonamide Derivatives Hives    fever  .  Glipizide Other (See Comments)    psychosis  . Sulfamethoxazole W-Trimethoprim Rash    History   Social History  . Marital Status: Single    Spouse Name: N/A    Number of Children: N/A  . Years of Education: 12   Occupational History  . Disability   . UNEMPLOYED    Social History Main Topics  . Smoking status: Current Every Day Smoker -- 1.00 packs/day for 30 years    Types: Cigarettes    Start date: 05/18/2012  . Smokeless tobacco: Not on file  . Alcohol Use: No  . Drug Use: No  . Sexually Active: Not Currently -- Female partner(s)    Birth Control/ Protection: None, Inserts   Other Topics Concern  . 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 Colon cancer Other   . Hypertension Mother   . Stroke Father     deceased at age 45  . Heart disease Sister   . Anesthesia problems Neg Hx   . Hypotension Neg Hx   . Malignant hyperthermia Neg Hx   . Pseudochol deficiency Neg Hx      Review of Systems  Constitutional: Positive for appetite change and unexpected weight change. Negative for fever.  HENT: Positive for sneezing. Negative for ear pain, nosebleeds, congestion, sore throat, rhinorrhea, trouble swallowing, dental problem, postnasal drip and sinus pressure.   Eyes: Negative for redness and itching.  Respiratory: Positive for shortness of breath. Negative for cough, chest tightness and wheezing.   Cardiovascular: Positive for chest pain. Negative for palpitations and leg swelling.  Gastrointestinal: Negative for nausea and vomiting.  Genitourinary: Negative for dysuria.  Musculoskeletal: Negative for joint swelling.  Skin: Negative for rash.  Neurological: Positive for headaches.   Hematological: Does not bruise/bleed easily.  Psychiatric/Behavioral: Positive for dysphoric mood. The patient is not nervous/anxious.        Objective:   Physical Exam  Gen. Pleasant, obese, in no distress, normal affect ENT - no lesions, no post nasal drip, class 2-3 airway Neck: No JVD, no thyromegaly, no carotid bruits Lungs: no use of accessory muscles, no dullness to percussion, decreased without rales or rhonchi  Cardiovascular: Rhythm regular, heart sounds  normal, no murmurs or gallops, no peripheral edema Abdomen: soft and non-tender, no hepatosplenomegaly, BS normal. Musculoskeletal: No deformities, no cyanosis or clubbing Neuro:  alert, non focal, no tremors        Assessment & Plan:

## 2012-10-27 NOTE — Assessment & Plan Note (Signed)
Smoking cessation paramount Trial of nicotrol inhaler

## 2012-10-27 NOTE — Assessment & Plan Note (Signed)
RVSP decreased to 37 in 09/2012 -recorded as 57 during acute illness in jan

## 2012-10-27 NOTE — Patient Instructions (Addendum)
Smoking is affecting your lungs You have mild COPD Trial of albuterol inhaler 2puffs thrice daily as needed Trial of nicotrol inhaler

## 2012-10-27 NOTE — Assessment & Plan Note (Addendum)
Dyspnea is multifactorial - airway obstruction is mild,if at all, disatolic dysfunction, some weight gain relate deconditioning & anxiety  You have mild COPD Trial of albuterol inhaler 2puffs thrice daily as needed

## 2012-10-30 ENCOUNTER — Telehealth: Payer: Self-pay | Admitting: *Deleted

## 2012-10-30 DIAGNOSIS — J309 Allergic rhinitis, unspecified: Secondary | ICD-10-CM

## 2012-10-30 MED ORDER — CETIRIZINE HCL 10 MG PO TABS: 10.0000 mg | ORAL_TABLET | Freq: Every day | ORAL | Status: DC

## 2012-10-30 NOTE — Telephone Encounter (Signed)
Refill of Cetirizine sent to pharmacy.  Thanks!

## 2012-10-30 NOTE — Telephone Encounter (Signed)
Pt asking for refill on cetirizine 10 mg.  She states she has been having headache for 4 days and feels it's her sinus. Cetirizine works well for her. Will you refill or will she need to be seen?

## 2012-11-05 ENCOUNTER — Ambulatory Visit: Payer: Self-pay | Admitting: Internal Medicine

## 2012-11-06 ENCOUNTER — Encounter: Payer: Self-pay | Admitting: Internal Medicine

## 2012-11-06 ENCOUNTER — Ambulatory Visit: Payer: Self-pay | Admitting: Internal Medicine

## 2012-11-06 ENCOUNTER — Ambulatory Visit (INDEPENDENT_AMBULATORY_CARE_PROVIDER_SITE_OTHER): Payer: Medicare Other | Admitting: Internal Medicine

## 2012-11-06 VITALS — BP 131/78 | HR 100 | Temp 97.8°F | Resp 20 | Ht 65.0 in | Wt 213.0 lb

## 2012-11-06 DIAGNOSIS — G4700 Insomnia, unspecified: Secondary | ICD-10-CM | POA: Diagnosis not present

## 2012-11-06 DIAGNOSIS — E119 Type 2 diabetes mellitus without complications: Secondary | ICD-10-CM

## 2012-11-06 NOTE — Progress Notes (Signed)
Subjective:   Patient ID: [name] female   DOB: 21-Jun-1965 47 y.o.   MRN: [id number]  HPI: [name] is a 47 y.o. female with a pmhx detailed below who comes to the clinic today for a cc of insomnia. The patient reports that for the last two weeks she has had difficulty falling asleep and staying asleep. The patient is very tired throughout the day, requiring numerous short naps. She describes that she spends a lot of the early part of the night laying in bed talking on the phone and listening to the radio. She also states that she drinks a lot of pepsi up until bedtime. Of note, the patient has bipolar disorder managed by a psychiatrist. The patient states that her depakote and geodon have been titrated down over the last year. The most recent changed was 3 months ago. The patient denies racing thoughts, increased energy, inattention, and feelings of grandiosity. She states that she does not feel the way she feels when her psych problems are manifesting.    Past Medical History  Diagnosis Date  . Arteriosclerotic cardiovascular disease (ASCVD)     Minimal at cath in [facility].stress nuclear study in 8/08 with nl EF; neg stress echo in 2010  . Diabetes mellitus, type 2 2000    Onset in 2000; no insulin  . Hyperlipidemia   . Hypertension `    during treatment with Geodon  . Gastroesophageal reflux disease     Schatzki's ring  . Anemia, iron deficiency   . Alcohol abuse   . Depression   . Community acquired pneumonia 01/03/10, 05/2010, 04/2012    2011; with pleural effusion-[facility] acute resp failure; intubated in Jan 2014 (HMPV pneumonia)  . Obesity   . Schizoaffective disorder     requiring multiple psychiatric admissions  . Dysphagia   . Diastolic dysfunction     grade 2 per echo 2011  . Pulmonary hypertension 05/02/2012    Patient needs repeat echo in 06/2012   . History of alcohol abuse 07/22/2007    Qualifier: Diagnosis of  By: Rudene Anda      Current Outpatient Prescriptions  Medication Sig Dispense Refill  . divalproex (DEPAKOTE ER) 500 MG 24 hr tablet Take 1 tablet (500 mg total) by mouth at bedtime.  30 tablet  0  . esomeprazole (NEXIUM) 20 MG capsule Take 1 capsule (20 mg total) by mouth daily.  30 capsule  3  . hydrochlorothiazide (HYDRODIURIL) 25 MG tablet Take 1 tablet (25 mg total) by mouth daily.  30 tablet  3  . hydrocortisone (PROCTOZONE-HC) 2.5 % rectal cream Place rectally 2 (two) times daily.  30 g  0  . Insulin Glargine (LANTUS SOLOSTAR) 100 UNIT/ML SOPN INJECT 22 UNITS SUBCUTANEOUSLY AT BEDTIME.  15 mL  11  . lovastatin (MEVACOR) 20 MG tablet TAKE (1) TABLET BY MOUTH AT BEDTIME.  90 tablet  2  . metFORMIN (GLUCOPHAGE) 1000 MG tablet Take 1 tablet (1,000 mg total) by mouth 2 (two) times daily.  60 tablet  5  . metoprolol (LOPRESSOR) 50 MG tablet TAKE 1 TABLET BY MOUTH TWICE DAILY.  60 tablet  5  . NOVOFINE 32G X 6 MM MISC       . ziprasidone (GEODON) 80 MG capsule Take 1 capsule (80 mg total) by mouth 2 (two) times daily with a meal.  60 capsule  0  . albuterol (PROVENTIL HFA;VENTOLIN HFA) 108 (90 BASE) MCG/ACT inhaler Inhale 2 puffs into the  lungs 3 (three) times daily as needed for wheezing or shortness of breath.  1 Inhaler  2  . cetirizine (ZYRTEC) 10 MG tablet Take 1 tablet (10 mg total) by mouth daily.  30 tablet  5  . ibuprofen (ADVIL,MOTRIN) 200 MG tablet Take 400 mg by mouth every 8 (eight) hours as needed. Aches and pains, fever      . nicotine (NICOTROL) 10 MG inhaler Inhale 1 puff into the lungs as needed for smoking cessation.  42 each  0  . ondansetron (ZOFRAN ODT) 8 MG disintegrating tablet Take 1 tablet (8 mg total) by mouth every 8 (eight) hours as needed for nausea.  20 tablet  0   No current facility-administered medications for this visit.   Family History  Problem Relation Age of Onset  . Colon cancer Other   . Hypertension Mother   . Stroke Father     deceased at age 97  . Heart disease  Sister   . Anesthesia problems Neg Hx   . Hypotension Neg Hx   . Malignant hyperthermia Neg Hx   . Pseudochol deficiency Neg Hx    History   Social History  . Marital Status: Single    Spouse Name: N/A    Number of Children: N/A  . Years of Education: 12   Occupational History  . Disability   . UNEMPLOYED    Social History Main Topics  . Smoking status: Current Every Day Smoker -- 1.00 packs/day for 30 years    Types: Cigarettes    Start date: 05/18/2012  . Smokeless tobacco: Not on file     Comment: pulmonary dr gave something to try to inhale  . Alcohol Use: No  . Drug Use: No  . Sexually Active: Not Currently -- Female partner(s)    Birth Control/ Protection: None, Inserts   Other Topics Concern  . 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  Eyes: Negative for blurred vision and double vision.  Respiratory: Negative for cough, sputum production and shortness of breath.   Cardiovascular: Positive for chest pain. Negative for palpitations, orthopnea and leg swelling.       The patient had an isolated episode this morning of chest pain that resolved on its own.  Gastrointestinal: Negative for heartburn, nausea, vomiting, diarrhea and constipation.  Genitourinary: Negative for dysuria, urgency and frequency.  Neurological: Negative for weakness.     Objective:  Physical Exam: Filed Vitals:   11/06/12 1438 11/06/12 1443  BP: 104/71 131/78  Pulse: 86 100  Temp: 97.8 F (36.6 C)   Resp: 20   Height: 5\' 5"  (1.651 m)   Weight: 213 lb (96.616 kg)   SpO2: 99%    Physical Exam  Constitutional: She is oriented to person, place, and  time. She appears well-developed and well-nourished.  HENT:  Head: Normocephalic.  Eyes: EOM are normal.  Neck: Normal range of motion. Neck supple. No tracheal deviation present. No thyromegaly present.  Cardiovascular: Normal rate, regular rhythm and normal heart sounds.  Exam reveals no friction rub.   No murmur heard. Pulmonary/Chest: Effort normal and breath sounds normal. No respiratory distress. She has no wheezes.  Neurological: She is alert and oriented to person, place, and time.  Psychiatric: She has a normal mood and affect. Her behavior is normal.     Assessment & Plan:

## 2012-11-06 NOTE — Patient Instructions (Addendum)
Please do not drink caffeine after 2 pm  Make sure to turn off all lights, TV, and radio before bedtime  Only lay down in bed when it is time to sleep.  Follow up tomorrow with psychiatrist about trouble sleeping and possibility of sleep aid   Insomnia Insomnia is frequent trouble falling and/or staying asleep. Insomnia can be a long term problem or a short term problem. Both are common. Insomnia can be a short term problem when the wakefulness is related to a certain stress or worry. Long term insomnia is often related to ongoing stress during waking hours and/or poor sleeping habits. Overtime, sleep deprivation itself can make the problem worse. Every little thing feels more severe because you are overtired and your ability to cope is decreased. CAUSES   Stress, anxiety, and depression.  Poor sleeping habits.  Distractions such as TV in the bedroom.  Naps close to bedtime.  Engaging in emotionally charged conversations before bed.  Technical reading before sleep.  Alcohol and other sedatives. They may make the problem worse. They can hurt normal sleep patterns and normal dream activity.  Stimulants such as caffeine for several hours prior to bedtime.  Pain syndromes and shortness of breath can cause insomnia.  Exercise late at night.  Changing time zones may cause sleeping problems (jet lag). It is sometimes helpful to have someone observe your sleeping patterns. They should look for periods of not breathing during the night (sleep apnea). They should also look to see how long those periods last. If you live alone or observers are uncertain, you can also be observed at a sleep clinic where your sleep patterns will be professionally monitored. Sleep apnea requires a checkup and treatment. Give your caregivers your medical history. Give your caregivers observations your family has made about your sleep.  SYMPTOMS   Not feeling rested in the morning.  Anxiety and restlessness at  bedtime.  Difficulty falling and staying asleep. TREATMENT   Your caregiver may prescribe treatment for an underlying medical disorders. Your caregiver can give advice or help if you are using alcohol or other drugs for self-medication. Treatment of underlying problems will usually eliminate insomnia problems.  Medications can be prescribed for short time use. They are generally not recommended for lengthy use.  Over-the-counter sleep medicines are not recommended for lengthy use. They can be habit forming.  You can promote easier sleeping by making lifestyle changes such as:  Using relaxation techniques that help with breathing and reduce muscle tension.  Exercising earlier in the day.  Changing your diet and the time of your last meal. No night time snacks.  Establish a regular time to go to bed.  Counseling can help with stressful problems and worry.  Soothing music and white noise may be helpful if there are background noises you cannot remove.  Stop tedious detailed work at least one hour before bedtime. HOME CARE INSTRUCTIONS   Keep a diary. Inform your caregiver about your progress. This includes any medication side effects. See your caregiver regularly. Take note of:  Times when you are asleep.  Times when you are awake during the night.  The quality of your sleep.  How you feel the next day. This information will help your caregiver care for you.  Get out of bed if you are still awake after 15 minutes. Read or do some quiet activity. Keep the lights down. Wait until you feel sleepy and go back to bed.  Keep regular sleeping and waking hours.  Avoid naps.  Exercise regularly.  Avoid distractions at bedtime. Distractions include watching television or engaging in any intense or detailed activity like attempting to balance the household checkbook.  Develop a bedtime ritual. Keep a familiar routine of bathing, brushing your teeth, climbing into bed at the same time  each night, listening to soothing music. Routines increase the success of falling to sleep faster.  Use relaxation techniques. This can be using breathing and muscle tension release routines. It can also include visualizing peaceful scenes. You can also help control troubling or intruding thoughts by keeping your mind occupied with boring or repetitive thoughts like the old concept of counting sheep. You can make it more creative like imagining planting one beautiful flower after another in your backyard garden.  During your day, work to eliminate stress. When this is not possible use some of the previous suggestions to help reduce the anxiety that accompanies stressful situations. MAKE SURE YOU:   Understand these instructions.  Will watch your condition.  Will get help right away if you are not doing well or get worse. Document Released: 03/30/2000 Document Revised: 06/25/2011 Document Reviewed: 04/30/2007 [facility] Patient Information 2014 [place], [place].

## 2012-11-06 NOTE — Assessment & Plan Note (Signed)
A:  The patient appears to have insomnia that is likely related to poor sleep hygiene and possibly 2/2 psychiatric medications.  P:  I recommended that the patient improve her sleep hygiene by avoiding caffeine after 2 pm, turning off all lights, TV, and radio before bedtime, and only laying down in bed when it is time to sleep. Further, I encouraged the patient to focus on resting even if she is having trouble sleeping. As the patient already has follow up tomorrow with psychiatrist, I recommended that the patient discuss this issue with thepsychiatrist.  The patient asked about being prescribed a sleep aid. I recommended that the patient ask her psychiatrist to manage a potential sleep aid.

## 2012-11-07 DIAGNOSIS — F319 Bipolar disorder, unspecified: Secondary | ICD-10-CM | POA: Diagnosis not present

## 2012-11-13 NOTE — Progress Notes (Signed)
I saw and evaluated the patient.  I personally confirmed the key portions of the history and exam documented by Dr. Komanski and I reviewed pertinent patient test results.  The assessment, diagnosis, and plan were formulated together and I agree with the documentation in the resident's note.  

## 2012-11-21 ENCOUNTER — Other Ambulatory Visit: Payer: Self-pay | Admitting: Internal Medicine

## 2012-11-28 ENCOUNTER — Ambulatory Visit: Payer: Self-pay | Admitting: Adult Health

## 2012-11-28 ENCOUNTER — Other Ambulatory Visit: Payer: Self-pay | Admitting: Obstetrics & Gynecology

## 2012-12-02 ENCOUNTER — Ambulatory Visit: Payer: Self-pay | Admitting: Adult Health

## 2012-12-09 ENCOUNTER — Encounter: Payer: Self-pay | Admitting: Adult Health

## 2012-12-09 ENCOUNTER — Ambulatory Visit (INDEPENDENT_AMBULATORY_CARE_PROVIDER_SITE_OTHER): Payer: Medicare Other | Admitting: Adult Health

## 2012-12-09 VITALS — BP 134/74 | HR 84 | Temp 98.3°F | Ht 65.0 in | Wt 216.0 lb

## 2012-12-09 DIAGNOSIS — G4710 Hypersomnia, unspecified: Secondary | ICD-10-CM | POA: Diagnosis not present

## 2012-12-09 DIAGNOSIS — J449 Chronic obstructive pulmonary disease, unspecified: Secondary | ICD-10-CM

## 2012-12-09 DIAGNOSIS — J4489 Other specified chronic obstructive pulmonary disease: Secondary | ICD-10-CM

## 2012-12-09 DIAGNOSIS — G4719 Other hypersomnia: Secondary | ICD-10-CM

## 2012-12-09 NOTE — Assessment & Plan Note (Signed)
Daytime hypersomnolence , obesity Advised on healthy sleep hygiene.  Avoid sedating rx  D/c zyrtec.  Set up for sleep study.  Advised on dangers of driving when sleepy.

## 2012-12-09 NOTE — Progress Notes (Signed)
Subjective:    Patient ID: [name], female    DOB: 03-Jan-1966, 47 y.o.   MRN: [id number]  HPI 47 y.o.bipolar smoker referred for evaluation of dyspnea She has a PMHX of DM2 (A1c 8.2), HLD, bipolar disorder, schizoaffective disorder, and alcohol abuse. Pt last seen 01/2010. former [facility] pt. >>dyspnea was attributed to deconditioning and anxiety   Pt c/o SOB at rest and with activity. occasional cough. No wheezing and she denies orthopnea or paroxysmal nocturnal dyspnea or pedal edema. Occasional will have sharp pains R side of chest. She denies seasonal allergies, nasal stuffiness or childhood history of asthma. She was admitted to [facility] on 04/28/2012 on transfer from [facility] (admitted on 04/20/2012) with severe CAP in setting of acute respiratory failure requiring intubation on 04/20/2012 X 1 week. Resp virus panel positive for human metapneumovirus 04/21/12 - TTE >> LVEF 60-65%, estimated PASP Rpt echo 6/14 nml LVEF, RVSP 37  Chest x-ray on 05/13/12 was normal. She is a low risk Myoview study on 4/14 Spirometry showed mild airway obstruction with FEV1 of 73% FVC of 90% and ratio 67. Smaller airways were moderately decreased at 40%. Bronchodilator testing was not performed.  She smokes about a pack per day and has been able to quit for a month on prior occasions however feels that it is very difficult this time. She has gained weight in the last 2 years and states that she is lost 8 pounds in the last few months with exercise. [facility]. At her PCP visit,she  c/o lightheadedness, and zyrtec, gabapentin, trazodone & remeron were discontinued.  12/09/2012 Follow up  4 week follow up SOB - reports breathing is better since last ov and rare SABA use.  does report some increased daytime sleepiness x2 weeks.  Has seen her PCP for insomnia and daytime sleepiness.  Feels that she does not rest well. Snores most nights, wakes up tired and naps  frequently. Feels that zyrtec is making her more sleepy. Her psychiatrist decreased her Bipolar rx to see if this helped but has not noticed any change.  Still smoking Smoking cessation instruction/counseling given:    Review of Systems Constitutional:   No  weight loss, night sweats,  Fevers, chills, ++fatigue, or  lassitude.  HEENT:   No headaches,  Difficulty swallowing,  Tooth/dental problems, or  Sore throat,                No sneezing, itching, ear ache, nasal congestion, post nasal drip,   CV:  No chest pain,  Orthopnea, PND, swelling in lower extremities, anasarca, dizziness, palpitations, syncope.   GI  No heartburn, indigestion, abdominal pain, nausea, vomiting, diarrhea, change in bowel habits, loss of appetite, bloody stools.   Resp:    No coughing up of blood.  No change in color of mucus.  No wheezing.  No chest wall deformity  Skin: no rash or lesions.  GU: no dysuria, change in color of urine, no urgency or frequency.  No flank pain, no hematuria   MS:  No joint pain or swelling.  No decreased range of motion.  No back pain.  Psych:     No memory loss.         Objective:   Physical Exam GEN: A/Ox3; pleasant , NAD, well nourished   HEENT:  [place]/AT,  EACs-clear, TMs-wnl, NOSE-clear, THROAT-clear, no lesions, no postnasal drip or exudate noted. Class 2 airway  NECK:  Supple w/ fair ROM;  no JVD; normal carotid impulses w/o bruits; no thyromegaly or nodules palpated; no lymphadenopathy.  RESP  Clear  P & A; w/o, wheezes/ rales/ or rhonchi.no accessory muscle use, no dullness to percussion  CARD:  RRR, no m/r/g  , no peripheral edema, pulses intact, no cyanosis or clubbing.  GI:   Soft & nt; nml bowel sounds; no organomegaly or masses detected.  Musco: Warm bil, no deformities or joint swelling noted.   Neuro: alert, no focal deficits noted.    Skin: Warm, no lesions or rashes         Assessment & Plan:

## 2012-12-09 NOTE — Patient Instructions (Addendum)
Stop Zyrtec.  We will set you up for an overnight split night sleep study.  Do not drive when when you are sleepy.  Most important step is to quit smoking  Follow up Dr. Vassie Loll  In 6-8 weeks and As needed

## 2012-12-09 NOTE — Assessment & Plan Note (Signed)
Mild COPD in setting of active smoking  Smoking cessation discussed.

## 2012-12-10 ENCOUNTER — Ambulatory Visit (INDEPENDENT_AMBULATORY_CARE_PROVIDER_SITE_OTHER): Payer: Medicare Other | Admitting: Internal Medicine

## 2012-12-10 ENCOUNTER — Encounter: Payer: Self-pay | Admitting: Internal Medicine

## 2012-12-10 ENCOUNTER — Telehealth: Payer: Self-pay | Admitting: *Deleted

## 2012-12-10 VITALS — BP 132/81 | HR 92 | Temp 98.0°F | Wt 215.4 lb

## 2012-12-10 DIAGNOSIS — E119 Type 2 diabetes mellitus without complications: Secondary | ICD-10-CM

## 2012-12-10 DIAGNOSIS — J069 Acute upper respiratory infection, unspecified: Secondary | ICD-10-CM | POA: Insufficient documentation

## 2012-12-10 DIAGNOSIS — J329 Chronic sinusitis, unspecified: Secondary | ICD-10-CM | POA: Diagnosis not present

## 2012-12-10 DIAGNOSIS — Z0000 Encounter for general adult medical examination without abnormal findings: Secondary | ICD-10-CM | POA: Diagnosis not present

## 2012-12-10 MED ORDER — INSULIN GLARGINE 100 UNIT/ML SOLOSTAR PEN: PEN_INJECTOR | SUBCUTANEOUS | Status: DC

## 2012-12-10 MED ORDER — MOMETASONE FUROATE 50 MCG/ACT NA SUSP: 4.0000 | Freq: Every day | NASAL | Status: DC

## 2012-12-10 NOTE — Assessment & Plan Note (Signed)
Lab Results  Component Value Date   HGBA1C 7.7 12/10/2012   HGBA1C 8.5 09/16/2012   HGBA1C 8.2* 04/20/2012     Assessment: Diabetes control: fair control Progress toward A1C goal:  improved  Plan: Medications:  Increase lantus from 22u qHS to 24u qHS Home glucose monitoring: Frequency: 2 times a day Timing: before breakfast;before dinner Instruction/counseling given: reminded to get eye exam, reminded to bring blood glucose meter & log to each visit and reminded to bring medications to each visit

## 2012-12-10 NOTE — Assessment & Plan Note (Signed)
Likely viral etiology, only 2d duration and not worsening. Continue supportive tx with fluids, nasonex and monitor.  If worsening or no improvement, instructed to leave a msg for me, and I will call in antibiotics.

## 2012-12-10 NOTE — Telephone Encounter (Signed)
Has clear productive cough and shoulder and back ache. Pt states always goes in to pneumonia. Appt today with Dr Everardo Beals 2:45PM. Serrena Linderman RN 12/10/12 9:30AM

## 2012-12-10 NOTE — Assessment & Plan Note (Signed)
Flu shot deferred today given current illness; advised to make appt once feeling better for RN appt for vaccination

## 2012-12-10 NOTE — Progress Notes (Signed)
Subjective:   Patient ID: [name] female   DOB: [birth year] 47 y.o.   MRN: [id number]  HPI: [name] is a 47 y.o. woman with h/o grade 2 dCHF and pulmonary HTN who presents for DM follow up and for acute visit regarding cough.  Cough for the last few days (2d), initially was intermittent, feeling achy, no sputum production, afraid of catching pneumonia because everytime she catches a cold, she gets PNA.  No sick contacts. Went away to [place] for the weekend and hotel stayed cold. No fever but +chills. Appetite okay. +nasal congestion.  Taken off of cetirizine yesterday. Some post nasal drip. Has not had to use albuterol.  Continues to smoke 1 PPD, gave up and tired of trying.  No nausea/vomiting/diarrhea.  Hasn't been drinking many fluids.  Denies dizziness. No HA. Reports feel swelling a lot lately, because traveling and walking around.   Didn't bring meter today, hasn't been checking lately because has been on vacation.  Lowest CBGs around 116.  Cannot recall highest blood sugars. Unrestricted diet recently - eating everything that she hasn't had in a long time in [place] & at [place].   Missed metoprolol this morning  Past Medical History  Diagnosis Date  . Arteriosclerotic cardiovascular disease (ASCVD)     Minimal at cath in [facility].stress nuclear study in 8/08 with nl EF; neg stress echo in 2010  . Diabetes mellitus, type 2 2000    Onset in 2000; no insulin  . Hyperlipidemia   . Hypertension `    during treatment with Geodon  . Gastroesophageal reflux disease     Schatzki's ring  . Anemia, iron deficiency   . Alcohol abuse   . Depression   . Community acquired pneumonia 01/03/10, 05/2010, 04/2012    2011; with pleural effusion-[facility] acute resp failure; intubated in Jan 2014 (HMPV pneumonia)  . Obesity   . Schizoaffective disorder     requiring multiple psychiatric admissions  . Dysphagia   . Diastolic dysfunction     grade 2 per echo 2011    . Pulmonary hypertension 05/02/2012    Patient needs repeat echo in 06/2012   . History of alcohol abuse 07/22/2007    Qualifier: Diagnosis of  By: Rudene Anda     Current Outpatient Prescriptions  Medication Sig Dispense Refill  . albuterol (PROVENTIL HFA;VENTOLIN HFA) 108 (90 BASE) MCG/ACT inhaler Inhale 2 puffs into the lungs 3 (three) times daily as needed for wheezing or shortness of breath.  1 Inhaler  2  . cetirizine (ZYRTEC) 10 MG tablet Take 1 tablet (10 mg total) by mouth daily.  30 tablet  5  . divalproex (DEPAKOTE ER) 500 MG 24 hr tablet Take 1 tablet (500 mg total) by mouth at bedtime.  30 tablet  0  . esomeprazole (NEXIUM) 20 MG capsule Take 1 capsule (20 mg total) by mouth daily.  30 capsule  3  . Garcinia Cambogia-Chromium 500-200 MG-MCG TABS Take 2 tablets by mouth 2 (two) times daily.      . hydrochlorothiazide (HYDRODIURIL) 25 MG tablet Take 1 tablet (25 mg total) by mouth daily.  30 tablet  3  . hydrocortisone (PROCTOZONE-HC) 2.5 % rectal cream Place rectally 2 (two) times daily.  30 g  0  . ibuprofen (ADVIL,MOTRIN) 200 MG tablet Take 400 mg by mouth every 8 (eight) hours as needed. Aches and pains, fever      . Insulin Glargine (LANTUS SOLOSTAR) 100 UNIT/ML SOPN INJECT  22 UNITS SUBCUTANEOUSLY AT BEDTIME.  15 mL  11  . lovastatin (MEVACOR) 20 MG tablet TAKE (1) TABLET BY MOUTH AT BEDTIME.  90 tablet  2  . metFORMIN (GLUCOPHAGE) 1000 MG tablet Take 1 tablet (1,000 mg total) by mouth 2 (two) times daily.  60 tablet  5  . metoprolol (LOPRESSOR) 50 MG tablet TAKE 1 TABLET BY MOUTH TWICE DAILY.  60 tablet  5  . nicotine (NICOTROL) 10 MG inhaler Inhale 1 puff into the lungs as needed for smoking cessation.  42 each  0  . NOVOFINE 32G X 6 MM MISC USE AS DIRECTED BEFORE BREAKFAST.  100 each  11  . ondansetron (ZOFRAN ODT) 8 MG disintegrating tablet Take 1 tablet (8 mg total) by mouth every 8 (eight) hours as needed for nausea.  20 tablet  0  . traZODone (DESYREL) 100 MG tablet Take 1  tablet by mouth at bedtime as needed.      . ziprasidone (GEODON) 80 MG capsule Take 1 capsule (80 mg total) by mouth 2 (two) times daily with a meal.  60 capsule  0   No current facility-administered medications for this visit.   Family History  Problem Relation Age of Onset  . Colon cancer Other   . Hypertension Mother   . Stroke Father     deceased at age 62  . Heart disease Sister   . Anesthesia problems Neg Hx   . Hypotension Neg Hx   . Malignant hyperthermia Neg Hx   . Pseudochol deficiency Neg Hx    History   Social History  . Marital Status: Single    Spouse Name: N/A    Number of Children: N/A  . Years of Education: 12   Occupational History  . Disability   . UNEMPLOYED    Social History Main Topics  . Smoking status: Current Every Day Smoker -- 1.00 packs/day for 30 years    Types: Cigarettes    Start date: 05/18/2012  . Smokeless tobacco: None     Comment: pulmonary dr gave something to try to inhale  . Alcohol Use: No  . Drug Use: No  . Sexual Activity: Not Currently    Partners: Male    Birth Control/ Protection: None, Inserts   Other Topics Concern  . 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, has interviewed for job again (08/16/11); On disability (depression qualifies); Graduated high school in [place] and some community college in [place]    Objective:  Physical Exam: Filed Vitals:   12/10/12 1510 12/10/12 1816  BP: 132/81   Pulse: 120 92  Temp: 98 F (36.7 C)   TempSrc: Oral   Weight: 215 lb 6.4 oz (97.705 kg)   SpO2: 99%    HEENT: PERRL, EOMI, no scleral icterus; Left maxillary tenderness Cardiac: RRR (HR 120-->92), no rubs, murmurs or gallops Pulm: clear to auscultation bilaterally, moving normal volumes of air Abd: soft, nontender, nondistended, BS normoactive Ext: warm and well perfused, trace pitting edema Neuro: alert and oriented X3, cranial nerves  II-XII grossly intact  Assessment & Plan:  Case and care discussed with Dr. Dalphine Handing.  Please see problem oriented charting for further details. Patient to return in 3 months for routine follow up, sooner if needed.

## 2012-12-10 NOTE — Patient Instructions (Addendum)
-  Use nasonex, 2 sprays in each nostril daily -Drink lots of fluids (try to stay away from sugary drinks) - it's important to stay hydrated!! -Increase lantus to 24u before bed daily -If you symptoms worsen or fail to improve in 1 week, please call and I will send in antibiotics -When you're feeling better, call to schedule a nurse appointment for your flu shot -Please reschedule your eye doctor appointment and have results sent to [name] -Also have results of your pap smear sent to [name]  Please be sure to bring all of your medications with you to every visit.  Should you have any new or worsening symptoms, please be sure to call the clinic at [phone].

## 2012-12-11 NOTE — Progress Notes (Signed)
Case discussed with Dr. Sharda soon after the resident saw the patient.  We reviewed the resident's history and exam and pertinent patient test results.  I agree with the assessment, diagnosis, and plan of care documented in the resident's note. 

## 2012-12-13 ENCOUNTER — Emergency Department (HOSPITAL_COMMUNITY): Payer: Medicare Other

## 2012-12-13 ENCOUNTER — Encounter (HOSPITAL_COMMUNITY): Payer: Self-pay | Admitting: *Deleted

## 2012-12-13 ENCOUNTER — Emergency Department (HOSPITAL_COMMUNITY)
Admission: EM | Admit: 2012-12-13 | Discharge: 2012-12-13 | Disposition: A | Discharge status: Home / Self Care | Payer: Medicare Other | Attending: Emergency Medicine | Admitting: Emergency Medicine

## 2012-12-13 DIAGNOSIS — R059 Cough, unspecified: Secondary | ICD-10-CM | POA: Diagnosis not present

## 2012-12-13 DIAGNOSIS — I10 Essential (primary) hypertension: Secondary | ICD-10-CM | POA: Insufficient documentation

## 2012-12-13 DIAGNOSIS — E119 Type 2 diabetes mellitus without complications: Secondary | ICD-10-CM | POA: Insufficient documentation

## 2012-12-13 DIAGNOSIS — Z8701 Personal history of pneumonia (recurrent): Secondary | ICD-10-CM | POA: Diagnosis not present

## 2012-12-13 DIAGNOSIS — R05 Cough: Secondary | ICD-10-CM

## 2012-12-13 DIAGNOSIS — E669 Obesity, unspecified: Secondary | ICD-10-CM | POA: Diagnosis not present

## 2012-12-13 DIAGNOSIS — Z794 Long term (current) use of insulin: Secondary | ICD-10-CM | POA: Diagnosis not present

## 2012-12-13 DIAGNOSIS — F259 Schizoaffective disorder, unspecified: Secondary | ICD-10-CM | POA: Diagnosis not present

## 2012-12-13 DIAGNOSIS — F17200 Nicotine dependence, unspecified, uncomplicated: Secondary | ICD-10-CM | POA: Insufficient documentation

## 2012-12-13 DIAGNOSIS — Z79899 Other long term (current) drug therapy: Secondary | ICD-10-CM | POA: Insufficient documentation

## 2012-12-13 DIAGNOSIS — Z880 Allergy status to penicillin: Secondary | ICD-10-CM | POA: Insufficient documentation

## 2012-12-13 DIAGNOSIS — E785 Hyperlipidemia, unspecified: Secondary | ICD-10-CM | POA: Insufficient documentation

## 2012-12-13 DIAGNOSIS — F329 Major depressive disorder, single episode, unspecified: Secondary | ICD-10-CM | POA: Insufficient documentation

## 2012-12-13 DIAGNOSIS — Z862 Personal history of diseases of the blood and blood-forming organs and certain disorders involving the immune mechanism: Secondary | ICD-10-CM | POA: Insufficient documentation

## 2012-12-13 DIAGNOSIS — R0602 Shortness of breath: Secondary | ICD-10-CM | POA: Insufficient documentation

## 2012-12-13 DIAGNOSIS — K219 Gastro-esophageal reflux disease without esophagitis: Secondary | ICD-10-CM | POA: Diagnosis not present

## 2012-12-13 DIAGNOSIS — F3289 Other specified depressive episodes: Secondary | ICD-10-CM | POA: Insufficient documentation

## 2012-12-13 MED ORDER — HYDROCODONE-ACETAMINOPHEN 7.5-325 MG/15ML PO SOLN: 10.0000 mL | Freq: Every evening | ORAL | Status: DC | PRN

## 2012-12-13 MED ORDER — AZITHROMYCIN 250 MG PO TABS: 250.0000 mg | ORAL_TABLET | Freq: Every day | ORAL | Status: DC

## 2012-12-13 MED ORDER — HYDROCODONE-ACETAMINOPHEN 7.5-325 MG/15ML PO SOLN
10.0000 mL | Freq: Once | ORAL | Status: AC
  Administered 2012-12-13: 10 mL via ORAL
  Filled 2012-12-13 (×2): qty 15

## 2012-12-13 NOTE — ED Provider Notes (Signed)
CSN: [id number]     Arrival date & time 12/13/12  0415 History   First MD Initiated Contact with Patient 12/13/12 0441     Chief Complaint  Patient presents with  . Cough  . Shortness of Breath   (Consider location/radiation/quality/duration/timing/severity/associated sxs/prior Treatment) HPI History provided by patient. Has had a cough for the last few days, seems to be worse at night and she has been unable to sleep. No fevers. No hemoptysis or productive sputum. No congestion. No rash. No recent travel. No known sick contacts. Symptoms moderate in severity. Patient states she gets sick like this every year and requires antibiotics to clear it up. No chest pain. No leg pain or leg swelling.  Past Medical History  Diagnosis Date  . Arteriosclerotic cardiovascular disease (ASCVD)     Minimal at cath in [facility].stress nuclear study in 8/08 with nl EF; neg stress echo in 2010  . Diabetes mellitus, type 2 2000    Onset in 2000; no insulin  . Hyperlipidemia   . Hypertension `    during treatment with Geodon  . Gastroesophageal reflux disease     Schatzki's ring  . Anemia, iron deficiency   . Alcohol abuse   . Depression   . Community acquired pneumonia 01/03/10, 05/2010, 04/2012    2011; with pleural effusion-[facility] acute resp failure; intubated in Jan 2014 (HMPV pneumonia)  . Obesity   . Schizoaffective disorder     requiring multiple psychiatric admissions  . Dysphagia   . Diastolic dysfunction     grade 2 per echo 2011  . Pulmonary hypertension 05/02/2012    Patient needs repeat echo in 06/2012   . History of alcohol abuse 07/22/2007    Qualifier: Diagnosis of  By: Rudene Anda     Past Surgical History  Procedure Laterality Date  . Dilation and curettage, diagnostic / therapeutic  1992  . Esophagogastroduodenoscopy  09/16/08    Dr. Ronni Rumble hiatal hernia/excoriations involving the cardia and mucosa consistent with trauma, antral erosions  of linear petechiae  ? gastritis versus early gastric antral vascular  ectasia.Marland Kitchen biopsy showed reactive gastropathy. No H. pylori.  . Esophagogastroduodenoscopy  09/2007    Dr. Rinaldo Ratel ring, dilated to 56 French Maloney dilator, small hiatal hernia, antral erosions, biopsies reactive gastropathy.  Gaspar Bidding dilation  07/17/2011    [facility] sedation-->distal esophageal stricture s/p dilation, chronic gastritis, multiple ulcers in stomach. no h.pylori  . Colonoscopy  01/2006    internal hemorrhoids  . Colonoscopy  01/10/2012    Procedure: COLONOSCOPY;  Surgeon: Corbin Ade, MD;  Location: [facility];  Service: Endoscopy;  Laterality: N/A;  entered cecum @ [phone] ; total cecal withdrawal time = 8 minutes   Family History  Problem Relation Age of Onset  . Colon cancer Other   . Hypertension Mother   . Stroke Father     deceased at age 59  . Heart disease Sister   . Anesthesia problems Neg Hx   . Hypotension Neg Hx   . Malignant hyperthermia Neg Hx   . Pseudochol deficiency Neg Hx    History  Substance Use Topics  . Smoking status: Current Every Day Smoker -- 1.00 packs/day for 30 years    Types: Cigarettes    Start date: 05/18/2012  . Smokeless tobacco: Not on file     Comment: pulmonary dr gave something to try to inhale  . Alcohol Use: No   OB History   Grav Para Term Preterm Abortions TAB  SAB Ect Mult Living                 Review of Systems  Constitutional: Negative for fever and chills.  HENT: Negative for neck pain and neck stiffness.   Eyes: Negative for pain.  Respiratory: Positive for cough. Negative for shortness of breath.   Cardiovascular: Negative for chest pain.  Gastrointestinal: Negative for abdominal pain.  Genitourinary: Negative for dysuria.  Musculoskeletal: Negative for back pain.  Skin: Negative for rash.  Neurological: Negative for headaches.  All other systems reviewed and are negative.    Allergies  Metronidazole; Orange; Shrimp; Penicillins; Sulfonamide  derivatives; Glipizide; and Sulfamethoxazole w-trimethoprim  Home Medications   Current Outpatient Rx  Name  Route  Sig  Dispense  Refill  . albuterol (PROVENTIL HFA;VENTOLIN HFA) 108 (90 BASE) MCG/ACT inhaler   Inhalation   Inhale 2 puffs into the lungs 3 (three) times daily as needed for wheezing or shortness of breath.   1 Inhaler   2   . divalproex (DEPAKOTE ER) 500 MG 24 hr tablet   Oral   Take 1 tablet (500 mg total) by mouth at bedtime.   30 tablet   0   . esomeprazole (NEXIUM) 20 MG capsule   Oral   Take 1 capsule (20 mg total) by mouth daily.   30 capsule   3   . Garcinia Cambogia-Chromium 500-200 MG-MCG TABS   Oral   Take 2 tablets by mouth 2 (two) times daily.         . hydrochlorothiazide (HYDRODIURIL) 25 MG tablet   Oral   Take 1 tablet (25 mg total) by mouth daily.   30 tablet   3   . hydrocortisone (PROCTOZONE-HC) 2.5 % rectal cream   Rectal   Place rectally 2 (two) times daily.   30 g   0   . ibuprofen (ADVIL,MOTRIN) 200 MG tablet   Oral   Take 400 mg by mouth every 8 (eight) hours as needed. Aches and pains, fever         . Insulin Glargine (LANTUS SOLOSTAR) 100 UNIT/ML SOPN      INJECT 24 UNITS SUBCUTANEOUSLY AT BEDTIME.   15 mL   11   . lovastatin (MEVACOR) 20 MG tablet      TAKE (1) TABLET BY MOUTH AT BEDTIME.   90 tablet   2   . metFORMIN (GLUCOPHAGE) 1000 MG tablet   Oral   Take 1 tablet (1,000 mg total) by mouth 2 (two) times daily.   60 tablet   5   . metoprolol (LOPRESSOR) 50 MG tablet      TAKE 1 TABLET BY MOUTH TWICE DAILY.   60 tablet   5   . mometasone (NASONEX) 50 MCG/ACT nasal spray   Nasal   Place 4 sprays into the nose daily.   17 g   2   . nicotine (NICOTROL) 10 MG inhaler   Inhalation   Inhale 1 puff into the lungs as needed for smoking cessation.   42 each   0   . NOVOFINE 32G X 6 MM MISC      USE AS DIRECTED BEFORE BREAKFAST.   100 each   11   . ondansetron (ZOFRAN ODT) 8 MG disintegrating  tablet   Oral   Take 1 tablet (8 mg total) by mouth every 8 (eight) hours as needed for nausea.   20 tablet   0   . traZODone (DESYREL) 100 MG tablet  Oral   Take 1 tablet by mouth at bedtime as needed.         . ziprasidone (GEODON) 80 MG capsule   Oral   Take 1 capsule (80 mg total) by mouth 2 (two) times daily with a meal.   60 capsule   0    BP 117/66  Pulse 85  Temp(Src) 98.1 F (36.7 C) (Oral)  Resp 22  Ht 5\' 5"  (1.651 m)  Wt 210 lb (95.255 kg)  BMI 34.95 kg/m2  SpO2 97%  LMP 11/14/2012 Physical Exam  Constitutional: She is oriented to person, place, and time. She appears well-developed and well-nourished.  HENT:  Head: Normocephalic and atraumatic.  Mouth/Throat: Oropharynx is clear and moist. No oropharyngeal exudate.  Eyes: EOM are normal. Pupils are equal, round, and reactive to light.  Neck: Neck supple.  Cardiovascular: Normal rate, regular rhythm and intact distal pulses.   Pulmonary/Chest: Effort normal and breath sounds normal. No respiratory distress. She has no wheezes. She has no rales. She exhibits no tenderness.  Abdominal: Soft. She exhibits no distension. There is no tenderness.  Musculoskeletal: Normal range of motion. She exhibits no edema.  No calf tenderness  Neurological: She is alert and oriented to person, place, and time.  Skin: Skin is warm and dry.    ED Course  Procedures (including critical care time)  Pulse ox 97% room air is adequate  Imaging Review Dg Chest Port 1 View  12/13/2012   *RADIOLOGY REPORT*  Clinical Data: Shortness of breath  PORTABLE CHEST - 1 VIEW  Comparison: Prior radiograph from 05/13/2012  Findings: Cardiac and mediastinal silhouettes are stable in size and contour, and remain within normal limits.  The lungs are normally inflated.  No airspace consolidation, pleural effusion, or pulmonary edema is identified.  There is no pneumothorax.  No acute osseous abnormality identified.  IMPRESSION: No acute  cardiopulmonary process.   Original Report Authenticated By: Rise Mu, M.D.   X-ray reviewed as above. Plan discharge home with Z-Pak and cough medication. Patient agrees to close outpatient followup and strict return precautions for any worsening condition.   MDM   Diagnosis: Cough Chest x-ray Vital signs and nursing notes reviewed and considered   Sunnie Nielsen, MD 12/13/12 [phone]

## 2012-12-13 NOTE — ED Notes (Signed)
Pt called son and is waiting for him to come pick her up.

## 2012-12-13 NOTE — ED Notes (Signed)
Pt states she gets a bad cough every year around this time. Pt states she has coughed all night and cant sleep. Pt staes her PCP was going to start her on an antibiotic but wanted to wait until next Tuesday to see if her cough cleared up.

## 2012-12-18 ENCOUNTER — Telehealth: Payer: Self-pay | Admitting: *Deleted

## 2012-12-18 ENCOUNTER — Encounter: Payer: Self-pay | Admitting: Obstetrics & Gynecology

## 2012-12-18 ENCOUNTER — Ambulatory Visit (INDEPENDENT_AMBULATORY_CARE_PROVIDER_SITE_OTHER): Payer: Medicare Other | Admitting: Obstetrics & Gynecology

## 2012-12-18 VITALS — BP 120/80 | Ht 64.0 in | Wt 213.0 lb

## 2012-12-18 DIAGNOSIS — Z3202 Encounter for pregnancy test, result negative: Secondary | ICD-10-CM

## 2012-12-18 DIAGNOSIS — Z3200 Encounter for pregnancy test, result unknown: Secondary | ICD-10-CM

## 2012-12-18 LAB — POCT URINE PREGNANCY: Preg Test, Ur: NEGATIVE

## 2012-12-18 NOTE — Telephone Encounter (Signed)
Pt calls and states she thinks she is pregnant and would like to know if there are medicines that she should stop? She has not done a pregnancy test yet but her mens. Period is late and " some things kinda happened". Please advise, you may call her at the ph# on chart.

## 2012-12-18 NOTE — Progress Notes (Signed)
Patient ID: [name], female   DOB: [birth year], 47 y.o.   MRN: [id number] Pt here today for UPT. UPT is neg. Pt states that her last unprotected sex was 12/05/2012. Pt states her period is normally al over the place and a couple times a month. Pt states she feels like she should be starting but hasn't. Pt has appointment on the 9th and wants blood work for [facility].

## 2012-12-23 ENCOUNTER — Encounter: Payer: Self-pay | Admitting: Obstetrics & Gynecology

## 2012-12-23 ENCOUNTER — Ambulatory Visit (INDEPENDENT_AMBULATORY_CARE_PROVIDER_SITE_OTHER): Payer: Medicare Other | Admitting: Obstetrics & Gynecology

## 2012-12-23 VITALS — BP 100/70 | Ht 66.0 in | Wt 215.0 lb

## 2012-12-23 DIAGNOSIS — N898 Other specified noninflammatory disorders of vagina: Secondary | ICD-10-CM | POA: Diagnosis not present

## 2012-12-23 DIAGNOSIS — N939 Abnormal uterine and vaginal bleeding, unspecified: Secondary | ICD-10-CM

## 2012-12-23 MED ORDER — MEGESTROL ACETATE 40 MG PO TABS: ORAL_TABLET | ORAL | Status: DC

## 2012-12-24 ENCOUNTER — Encounter (HOSPITAL_COMMUNITY): Payer: Self-pay | Admitting: *Deleted

## 2012-12-24 ENCOUNTER — Emergency Department (HOSPITAL_COMMUNITY)
Admission: EM | Admit: 2012-12-24 | Discharge: 2012-12-24 | Discharge status: Against Medical Advice | Payer: Medicare Other | Attending: Emergency Medicine | Admitting: Emergency Medicine

## 2012-12-24 DIAGNOSIS — R6883 Chills (without fever): Secondary | ICD-10-CM | POA: Diagnosis not present

## 2012-12-24 DIAGNOSIS — R197 Diarrhea, unspecified: Secondary | ICD-10-CM | POA: Insufficient documentation

## 2012-12-24 DIAGNOSIS — E669 Obesity, unspecified: Secondary | ICD-10-CM | POA: Diagnosis not present

## 2012-12-24 DIAGNOSIS — F17200 Nicotine dependence, unspecified, uncomplicated: Secondary | ICD-10-CM | POA: Insufficient documentation

## 2012-12-24 DIAGNOSIS — R51 Headache: Secondary | ICD-10-CM | POA: Insufficient documentation

## 2012-12-24 DIAGNOSIS — R42 Dizziness and giddiness: Secondary | ICD-10-CM | POA: Insufficient documentation

## 2012-12-24 DIAGNOSIS — E119 Type 2 diabetes mellitus without complications: Secondary | ICD-10-CM | POA: Diagnosis not present

## 2012-12-24 DIAGNOSIS — I10 Essential (primary) hypertension: Secondary | ICD-10-CM | POA: Insufficient documentation

## 2012-12-24 DIAGNOSIS — F319 Bipolar disorder, unspecified: Secondary | ICD-10-CM | POA: Diagnosis not present

## 2012-12-24 NOTE — ED Notes (Signed)
Dizziness and diarrhea started yesterday, c/o chills at times, thinks her potassium is low, also c/o HA, pt on menses and was started on med megace

## 2012-12-24 NOTE — ED Notes (Signed)
Pt stated that she is unable to wait any longer and left

## 2012-12-25 NOTE — Addendum Note (Signed)
Addended by: Dorie Rank E on: 12/25/2012 11:09 AM   Modules accepted: Orders

## 2013-01-02 ENCOUNTER — Ambulatory Visit (INDEPENDENT_AMBULATORY_CARE_PROVIDER_SITE_OTHER): Payer: Medicare Other | Admitting: *Deleted

## 2013-01-02 ENCOUNTER — Telehealth: Payer: Self-pay | Admitting: Obstetrics & Gynecology

## 2013-01-02 DIAGNOSIS — Z23 Encounter for immunization: Secondary | ICD-10-CM | POA: Diagnosis not present

## 2013-01-02 NOTE — Telephone Encounter (Signed)
Pt states taking Megace as Dr. Despina Hidden prescribed on 12/23/12, now taking two of the megace a day. Pt states bleeding has improved some but continues to bleed, also c/o "feeling light headed. Please advise.

## 2013-01-02 NOTE — Telephone Encounter (Signed)
Pt informed to stay on Megace until f/u per Dr. Despina Hidden.

## 2013-01-02 NOTE — Telephone Encounter (Signed)
No stay on the megestrol until follow up

## 2013-01-07 NOTE — Progress Notes (Signed)
Patient ID: [name], female   DOB: 1965/09/11, 47 y.o.   MRN: [id number] Pt with prolonged heavy bleeding really just now starting Also pain is increased as well  Will begin on megestrol algorithm to control period Schedule pelvic sonogram to assess endometrium

## 2013-01-08 ENCOUNTER — Encounter: Payer: Self-pay | Admitting: *Deleted

## 2013-01-12 ENCOUNTER — Ambulatory Visit (HOSPITAL_BASED_OUTPATIENT_CLINIC_OR_DEPARTMENT_OTHER): Payer: Self-pay

## 2013-01-14 ENCOUNTER — Encounter: Payer: Self-pay | Admitting: Internal Medicine

## 2013-01-15 ENCOUNTER — Ambulatory Visit (INDEPENDENT_AMBULATORY_CARE_PROVIDER_SITE_OTHER): Payer: Medicare Other

## 2013-01-15 ENCOUNTER — Encounter: Payer: Self-pay | Admitting: Obstetrics & Gynecology

## 2013-01-15 ENCOUNTER — Ambulatory Visit (INDEPENDENT_AMBULATORY_CARE_PROVIDER_SITE_OTHER): Payer: Medicare Other | Admitting: Obstetrics & Gynecology

## 2013-01-15 VITALS — BP 112/82 | Ht 64.0 in | Wt 210.0 lb

## 2013-01-15 DIAGNOSIS — D649 Anemia, unspecified: Secondary | ICD-10-CM

## 2013-01-15 DIAGNOSIS — Z124 Encounter for screening for malignant neoplasm of cervix: Secondary | ICD-10-CM | POA: Diagnosis not present

## 2013-01-15 DIAGNOSIS — N939 Abnormal uterine and vaginal bleeding, unspecified: Secondary | ICD-10-CM

## 2013-01-15 DIAGNOSIS — D500 Iron deficiency anemia secondary to blood loss (chronic): Secondary | ICD-10-CM | POA: Diagnosis not present

## 2013-01-15 DIAGNOSIS — N949 Unspecified condition associated with female genital organs and menstrual cycle: Secondary | ICD-10-CM

## 2013-01-15 DIAGNOSIS — N920 Excessive and frequent menstruation with regular cycle: Secondary | ICD-10-CM

## 2013-01-15 DIAGNOSIS — N898 Other specified noninflammatory disorders of vagina: Secondary | ICD-10-CM

## 2013-01-15 MED ORDER — MEGESTROL ACETATE 40 MG PO TABS: ORAL_TABLET | ORAL | Status: DC

## 2013-01-16 ENCOUNTER — Encounter: Payer: Self-pay | Admitting: Obstetrics & Gynecology

## 2013-01-16 ENCOUNTER — Other Ambulatory Visit (HOSPITAL_COMMUNITY)
Admission: RE | Admit: 2013-01-16 | Discharge: 2013-01-16 | Disposition: A | Discharge status: Home / Self Care | Payer: Medicare Other | Source: Ambulatory Visit | Attending: Obstetrics & Gynecology | Admitting: Obstetrics & Gynecology

## 2013-01-16 ENCOUNTER — Ambulatory Visit (INDEPENDENT_AMBULATORY_CARE_PROVIDER_SITE_OTHER): Payer: Medicare Other | Admitting: Obstetrics & Gynecology

## 2013-01-16 VITALS — BP 112/70 | Ht 65.5 in | Wt 211.0 lb

## 2013-01-16 DIAGNOSIS — Z1212 Encounter for screening for malignant neoplasm of rectum: Secondary | ICD-10-CM | POA: Diagnosis not present

## 2013-01-16 DIAGNOSIS — Z1151 Encounter for screening for human papillomavirus (HPV): Secondary | ICD-10-CM | POA: Diagnosis not present

## 2013-01-16 DIAGNOSIS — Z1389 Encounter for screening for other disorder: Secondary | ICD-10-CM

## 2013-01-16 DIAGNOSIS — R3915 Urgency of urination: Secondary | ICD-10-CM

## 2013-01-16 DIAGNOSIS — Z124 Encounter for screening for malignant neoplasm of cervix: Secondary | ICD-10-CM

## 2013-01-16 DIAGNOSIS — Z01419 Encounter for gynecological examination (general) (routine) without abnormal findings: Secondary | ICD-10-CM

## 2013-01-16 DIAGNOSIS — Z0000 Encounter for general adult medical examination without abnormal findings: Secondary | ICD-10-CM

## 2013-01-16 LAB — POCT URINALYSIS DIPSTICK
Ketones, UA: NEGATIVE
Protein, UA: NEGATIVE

## 2013-01-16 NOTE — Progress Notes (Signed)
Patient ID: [name], female   DOB: [birth year], 47 y.o.   MRN: [id number] Subjective:     [name] is a 47 y.o. female here for a routine exam.  Patient's last menstrual period was 12/22/2012. No obstetric history on file. Current complaints: none.     Gynecologic History Patient's last menstrual period was 12/22/2012. Contraception: oral progesterone-only contraceptive Last Pap: 2013. Results were: normal Last mammogram: 2012. Results were: normal  Past Medical History  Diagnosis Date  . Arteriosclerotic cardiovascular disease (ASCVD)     Minimal at cath in [facility].stress nuclear study in 8/08 with nl EF; neg stress echo in 2010  . Diabetes mellitus, type 2 2000    Onset in 2000; no insulin  . Hyperlipidemia   . Hypertension `    during treatment with Geodon  . Gastroesophageal reflux disease     Schatzki's ring  . Anemia, iron deficiency   . Alcohol abuse   . Depression   . Community acquired pneumonia 01/03/10, 05/2010, 04/2012    2011; with pleural effusion-[facility] acute resp failure; intubated in Jan 2014 (HMPV pneumonia)  . Obesity   . Schizoaffective disorder     requiring multiple psychiatric admissions  . Dysphagia   . Diastolic dysfunction     grade 2 per echo 2011  . Pulmonary hypertension 05/02/2012    Patient needs repeat echo in 06/2012   . History of alcohol abuse 07/22/2007    Qualifier: Diagnosis of  By: Rudene Anda      Past Surgical History  Procedure Laterality Date  . Dilation and curettage, diagnostic / therapeutic  1992  . Esophagogastroduodenoscopy  09/16/08    Dr. Ronni Rumble hiatal hernia/excoriations involving the cardia and mucosa consistent with trauma, antral erosions  of linear petechiae ? gastritis versus early gastric antral vascular  ectasia.Marland Kitchen biopsy showed reactive gastropathy. No H. pylori.  . Esophagogastroduodenoscopy  09/2007    Dr. Rinaldo Ratel ring, dilated to 56 French Maloney dilator, small hiatal  hernia, antral erosions, biopsies reactive gastropathy.  Gaspar Bidding dilation  07/17/2011    [facility] sedation-->distal esophageal stricture s/p dilation, chronic gastritis, multiple ulcers in stomach. no h.pylori  . Colonoscopy  01/2006    internal hemorrhoids  . Colonoscopy  01/10/2012    Procedure: COLONOSCOPY;  Surgeon: Corbin Ade, MD;  Location: [facility];  Service: Endoscopy;  Laterality: N/A;  entered cecum @ [phone] ; total cecal withdrawal time = 8 minutes    OB History   Grav Para Term Preterm Abortions TAB SAB Ect Mult Living                  History   Social History  . Marital Status: Single    Spouse Name: N/A    Number of Children: N/A  . Years of Education: 12   Occupational History  . Disability   . UNEMPLOYED    Social History Main Topics  . Smoking status: Current Every Day Smoker -- 1.00 packs/day for 30 years    Types: Cigarettes    Start date: 05/18/2012  . Smokeless tobacco: Never Used     Comment: pulmonary dr gave something to try to inhale  . Alcohol Use: 0.0 oz/week     Comment: every now and then  . Drug Use: No  . Sexual Activity: Yes    Partners: Male    Birth Control/ Protection: None, Inserts   Other Topics Concern  . 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 Colon cancer Other   . Hypertension Mother   . Stroke Father     deceased at age 21  . Heart disease Sister   . Anesthesia problems Neg Hx   . Hypotension Neg Hx   . Malignant hyperthermia Neg Hx   . Pseudochol deficiency Neg Hx      Review of Systems  Review of Systems  Constitutional: Negative for fever, chills, weight loss, malaise/fatigue and diaphoresis.  HENT: Negative for hearing loss,  ear pain, nosebleeds, congestion, sore throat, neck pain, tinnitus and ear discharge.   Eyes: Negative for blurred vision, double vision, photophobia, pain, discharge and redness.  Respiratory: Negative for cough, hemoptysis, sputum production, shortness of breath, wheezing and stridor.   Cardiovascular: Negative for chest pain, palpitations, orthopnea, claudication, leg swelling and PND.  Gastrointestinal: negative for abdominal pain. Negative for heartburn, nausea, vomiting, diarrhea, constipation, blood in stool and melena.  Genitourinary: Negative for dysuria, urgency, frequency, hematuria and flank pain.  Musculoskeletal: Negative for myalgias, back pain, joint pain and falls.  Skin: Negative for itching and rash.  Neurological: Negative for dizziness, tingling, tremors, sensory change, speech change, focal weakness, seizures, loss of consciousness, weakness and headaches.  Endo/Heme/Allergies: Negative for environmental allergies and polydipsia. Does not bruise/bleed easily.  Psychiatric/Behavioral: Negative for depression, suicidal ideas, hallucinations, memory loss and substance abuse. The patient is not nervous/anxious and does not have insomnia.        Objective:    Physical Exam  Vitals reviewed. Constitutional: She is oriented to person, place, and time. She appears well-developed and well-nourished.  HENT:  Head: Normocephalic and atraumatic.        Right Ear: External ear normal.  Left Ear: External ear normal.  Nose: Nose normal.  Mouth/Throat: Oropharynx is clear and moist.  Eyes: Conjunctivae and EOM are normal. Pupils are equal, round, and reactive to light. Right eye exhibits no discharge. Left eye exhibits no discharge. No scleral icterus.  Neck: Normal range of motion. Neck supple. No tracheal deviation present. No thyromegaly present.  Cardiovascular: Normal rate, regular rhythm, normal heart sounds and intact distal pulses.  Exam reveals no gallop and no friction  rub.   No murmur heard. Respiratory: Effort normal and breath sounds normal. No respiratory distress. She has no wheezes. She has no rales. She exhibits no tenderness.  GI: Soft. Bowel sounds are normal. She exhibits no distension and no mass. There is no tenderness. There is no rebound and no guarding.  Genitourinary:  Breasts no masses skin changes or nipple changes bilaterally      Vulva is normal without lesions Vagina is pink moist without discharge Cervix normal in appearance and pap is done Nabothian cysts present Uterus is normal size shape and contour Adnexa is negative with normal sized ovaries   Musculoskeletal: Normal range of motion. She exhibits no edema and no tenderness.  Neurological: She is alert and oriented to person, place, and time. She has normal reflexes. She displays normal reflexes. No cranial nerve deficit. She exhibits normal muscle tone. Coordination normal.  Skin: Skin is warm and dry. No rash noted. No erythema. No pallor.  Psychiatric: She has a normal mood and affect. Her behavior is normal. Judgment and thought  content normal.       Assessment:    Healthy female exam.    Plan:    Planning ablatin and tubal in November

## 2013-01-20 ENCOUNTER — Ambulatory Visit: Payer: Self-pay | Admitting: Internal Medicine

## 2013-01-21 NOTE — Progress Notes (Signed)
Patient ID: [name], female   DOB: 12-01-1965, 47 y.o.   MRN: [id number] Progress Notes    Patient ID: [name], female   DOB: 07/11/1965, 47 y.o.   MRN: [id number] Subjective:       [name] is a 47 y.o. female here for a routine exam.  Patient's last menstrual period was 12/22/2012. No obstetric history on file. Current complaints: none.       Gynecologic History Patient's last menstrual period was 12/22/2012. Contraception: oral progesterone-only contraceptive Last Pap: 2013. Results were: normal Last mammogram: 2012. Results were: normal    Past Medical History   Diagnosis  Date   .  Arteriosclerotic cardiovascular disease (ASCVD)         Minimal at cath in [facility].stress nuclear study in 8/08 with nl EF; neg stress echo in 2010   .  Diabetes mellitus, type 2  2000       Onset in 2000; no insulin   .  Hyperlipidemia     .  Hypertension  `       during treatment with Geodon   .  Gastroesophageal reflux disease         Schatzki's ring   .  Anemia, iron deficiency     .  Alcohol abuse     .  Depression     .  Community acquired pneumonia  01/03/10, 05/2010, 04/2012       2011; with pleural effusion-[facility] acute resp failure; intubated in Jan 2014 (HMPV pneumonia)   .  Obesity     .  Schizoaffective disorder         requiring multiple psychiatric admissions   .  Dysphagia     .  Diastolic dysfunction         grade 2 per echo 2011   .  Pulmonary hypertension  05/02/2012       Patient needs repeat echo in 06/2012    .  History of alcohol abuse  07/22/2007       Qualifier: Diagnosis of  By: Rudene Anda           Past Surgical History   Procedure  Laterality  Date   .  Dilation and curettage, diagnostic / therapeutic    1992   .  Esophagogastroduodenoscopy    09/16/08       Dr. Ronni Rumble hiatal hernia/excoriations involving the cardia and mucosa consistent with trauma, antral erosions  of linear petechiae ? gastritis versus early  gastric antral vascular  ectasia.Marland Kitchen biopsy showed reactive gastropathy. No H. pylori.   .  Esophagogastroduodenoscopy    09/2007       Dr. Rinaldo Ratel ring, dilated to 56 French Maloney dilator, small hiatal hernia, antral erosions, biopsies reactive gastropathy.   Gaspar Bidding dilation    07/17/2011       [facility] sedation-->distal esophageal stricture s/p dilation, chronic gastritis, multiple ulcers in stomach. no h.pylori   .  Colonoscopy    01/2006       internal hemorrhoids   .  Colonoscopy    01/10/2012       Procedure: COLONOSCOPY;  Surgeon: Corbin Ade, MD;  Location: [facility];  Service: Endoscopy;  Laterality: N/A;  entered cecum @ 0851 ; total cecal withdrawal time = 8 minutes         OB History     Grav  Para  Term  Preterm  Abortions  TAB  SAB  Ect  Mult  Living                                   History       Social History   .  Marital Status:  Single       Spouse Name:  N/A       Number of Children:  N/A   .  Years of Education:  12       Occupational History   .  Disability     .  UNEMPLOYED         Social History Main Topics   .  Smoking status:  Current Every Day Smoker -- 1.00 packs/day for 30 years       Types:  Cigarettes       Start date:  05/18/2012   .  Smokeless tobacco:  Never Used         Comment: pulmonary dr gave something to try to inhale   .  Alcohol Use:  0.0 oz/week         Comment: every now and then   .  Drug Use:  No   .  Sexual Activity:  Yes       Partners:  Male       Birth Control/ Protection:  None, Inserts       Other Topics  Concern   .  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  Colon cancer  Other     .  Hypertension   Mother     .  Stroke  Father         deceased at age 94   .  Heart disease  Sister     .  Anesthesia problems  Neg Hx     .  Hypotension  Neg Hx     .  Malignant hyperthermia  Neg Hx     .  Pseudochol deficiency  Neg Hx            Review of Systems   Review of Systems  Constitutional: Negative for fever, chills, weight loss, malaise/fatigue and diaphoresis.  HENT: Negative for hearing loss, ear pain, nosebleeds, congestion, sore throat, neck pain, tinnitus and ear discharge.   Eyes: Negative for blurred vision, double vision, photophobia, pain, discharge and redness.  Respiratory: Negative for cough, hemoptysis, sputum production, shortness of breath, wheezing and stridor.   Cardiovascular: Negative for chest pain, palpitations, orthopnea, claudication, leg swelling and PND.  Gastrointestinal: negative for abdominal pain. Negative for heartburn, nausea, vomiting, diarrhea, constipation, blood in stool and melena.  Genitourinary: Negative for dysuria, urgency, frequency, hematuria and flank pain.  Musculoskeletal: Negative for myalgias, back pain, joint pain and falls.  Skin: Negative for itching and rash.  Neurological: Negative for dizziness, tingling, tremors, sensory change, speech change, focal weakness, seizures, loss of consciousness, weakness and headaches.  Endo/Heme/Allergies: Negative for environmental allergies and polydipsia. Does not bruise/bleed easily.  Psychiatric/Behavioral: Negative for depression, suicidal ideas, hallucinations, memory loss and substance abuse. The patient is not nervous/anxious and does not have  insomnia.          Objective:       Physical Exam  Vitals reviewed. Constitutional: She is oriented to person, place, and time. She appears well-developed and well-nourished.  HENT:   Head: Normocephalic and atraumatic.        Right Ear: External ear normal.  Left Ear: External ear normal.   Nose: Nose normal.   Mouth/Throat: Oropharynx is  clear and moist.  Eyes: Conjunctivae and EOM are normal. Pupils are equal, round, and reactive to light. Right eye exhibits no discharge. Left eye exhibits no discharge. No scleral icterus.  Neck: Normal range of motion. Neck supple. No tracheal deviation present. No thyromegaly present.  Cardiovascular: Normal rate, regular rhythm, normal heart sounds and intact distal pulses.  Exam reveals no gallop and no friction rub.    No murmur heard. Respiratory: Effort normal and breath sounds normal. No respiratory distress. She has no wheezes. She has no rales. She exhibits no tenderness.  GI: Soft. Bowel sounds are normal. She exhibits no distension and no mass. There is no tenderness. There is no rebound and no guarding.  Genitourinary:   Breasts no masses skin changes or nipple changes bilaterally      Vulva is normal without lesions Vagina is pink moist without discharge Cervix normal in appearance and pap is done Nabothian cysts present Uterus is normal size shape and contour Adnexa is negative with normal sized ovaries    Musculoskeletal: Normal range of motion. She exhibits no edema and no tenderness.  Neurological: She is alert and oriented to person, place, and time. She has normal reflexes. She displays normal reflexes. No cranial nerve deficit. She exhibits normal muscle tone. Coordination normal.  Skin: Skin is warm and dry. No rash noted. No erythema. No pallor.  Psychiatric: She has a normal mood and affect. Her behavior is normal. Judgment and thought content normal.          Assessment:       Healthy female exam.     Plan:       Planning ablatin and tubal in November

## 2013-01-22 ENCOUNTER — Ambulatory Visit: Payer: Self-pay | Admitting: Pulmonary Disease

## 2013-01-23 ENCOUNTER — Encounter (HOSPITAL_COMMUNITY): Payer: Self-pay | Admitting: Emergency Medicine

## 2013-01-23 ENCOUNTER — Ambulatory Visit (INDEPENDENT_AMBULATORY_CARE_PROVIDER_SITE_OTHER): Payer: Medicare Other | Admitting: Internal Medicine

## 2013-01-23 ENCOUNTER — Telehealth: Payer: Self-pay | Admitting: Obstetrics & Gynecology

## 2013-01-23 ENCOUNTER — Encounter: Payer: Self-pay | Admitting: Internal Medicine

## 2013-01-23 ENCOUNTER — Emergency Department (HOSPITAL_COMMUNITY)
Admission: EM | Admit: 2013-01-23 | Discharge: 2013-01-23 | Disposition: A | Discharge status: Home / Self Care | Payer: Medicare Other | Attending: Emergency Medicine | Admitting: Emergency Medicine

## 2013-01-23 VITALS — BP 144/83 | HR 107 | Temp 97.9°F | Ht 64.0 in | Wt 208.8 lb

## 2013-01-23 DIAGNOSIS — R51 Headache: Secondary | ICD-10-CM | POA: Insufficient documentation

## 2013-01-23 DIAGNOSIS — E119 Type 2 diabetes mellitus without complications: Secondary | ICD-10-CM | POA: Insufficient documentation

## 2013-01-23 DIAGNOSIS — Z8701 Personal history of pneumonia (recurrent): Secondary | ICD-10-CM | POA: Insufficient documentation

## 2013-01-23 DIAGNOSIS — E669 Obesity, unspecified: Secondary | ICD-10-CM | POA: Insufficient documentation

## 2013-01-23 DIAGNOSIS — F1021 Alcohol dependence, in remission: Secondary | ICD-10-CM | POA: Insufficient documentation

## 2013-01-23 DIAGNOSIS — E785 Hyperlipidemia, unspecified: Secondary | ICD-10-CM | POA: Insufficient documentation

## 2013-01-23 DIAGNOSIS — N925 Other specified irregular menstruation: Secondary | ICD-10-CM | POA: Diagnosis not present

## 2013-01-23 DIAGNOSIS — R5381 Other malaise: Secondary | ICD-10-CM | POA: Diagnosis not present

## 2013-01-23 DIAGNOSIS — F259 Schizoaffective disorder, unspecified: Secondary | ICD-10-CM | POA: Diagnosis not present

## 2013-01-23 DIAGNOSIS — Z862 Personal history of diseases of the blood and blood-forming organs and certain disorders involving the immune mechanism: Secondary | ICD-10-CM | POA: Diagnosis not present

## 2013-01-23 DIAGNOSIS — N938 Other specified abnormal uterine and vaginal bleeding: Secondary | ICD-10-CM

## 2013-01-23 DIAGNOSIS — Z79899 Other long term (current) drug therapy: Secondary | ICD-10-CM | POA: Diagnosis not present

## 2013-01-23 DIAGNOSIS — F17200 Nicotine dependence, unspecified, uncomplicated: Secondary | ICD-10-CM | POA: Insufficient documentation

## 2013-01-23 DIAGNOSIS — R110 Nausea: Secondary | ICD-10-CM | POA: Insufficient documentation

## 2013-01-23 DIAGNOSIS — K219 Gastro-esophageal reflux disease without esophagitis: Secondary | ICD-10-CM | POA: Diagnosis not present

## 2013-01-23 DIAGNOSIS — N949 Unspecified condition associated with female genital organs and menstrual cycle: Secondary | ICD-10-CM

## 2013-01-23 DIAGNOSIS — I2789 Other specified pulmonary heart diseases: Secondary | ICD-10-CM | POA: Insufficient documentation

## 2013-01-23 DIAGNOSIS — IMO0002 Reserved for concepts with insufficient information to code with codable children: Secondary | ICD-10-CM | POA: Insufficient documentation

## 2013-01-23 DIAGNOSIS — R634 Abnormal weight loss: Secondary | ICD-10-CM | POA: Diagnosis not present

## 2013-01-23 DIAGNOSIS — Z794 Long term (current) use of insulin: Secondary | ICD-10-CM | POA: Insufficient documentation

## 2013-01-23 DIAGNOSIS — N898 Other specified noninflammatory disorders of vagina: Secondary | ICD-10-CM | POA: Insufficient documentation

## 2013-01-23 DIAGNOSIS — N939 Abnormal uterine and vaginal bleeding, unspecified: Secondary | ICD-10-CM

## 2013-01-23 DIAGNOSIS — Z880 Allergy status to penicillin: Secondary | ICD-10-CM | POA: Insufficient documentation

## 2013-01-23 LAB — LIPID PANEL
HDL: 27 mg/dL — ABNORMAL LOW (ref >39)
LDL Cholesterol: 64 mg/dL (ref 0–99)
Total CHOL/HDL Ratio: 4.7 Ratio
VLDL: 36 mg/dL (ref 0–40)

## 2013-01-23 LAB — CBC WITH DIFFERENTIAL/PLATELET
Basophils Absolute: 0 10*3/uL (ref 0.0–0.1)
Eosinophils Relative: 2 % (ref 0–5)
HCT: 34.6 % — ABNORMAL LOW (ref 36.0–46.0)
Lymphocytes Relative: 36 % (ref 12–46)
MCHC: 33.8 g/dL (ref 30.0–36.0)
MCV: 78.3 fL (ref 78.0–100.0)
Monocytes Absolute: 0.4 10*3/uL (ref 0.1–1.0)
RDW: 17.7 % — ABNORMAL HIGH (ref 11.5–15.5)
WBC: 6.8 10*3/uL (ref 4.0–10.5)

## 2013-01-23 LAB — BASIC METABOLIC PANEL
BUN: 9 mg/dL (ref 6–23)
CO2: 22 mEq/L (ref 19–32)
Calcium: 9.7 mg/dL (ref 8.4–10.5)
Creatinine, Ser: 0.97 mg/dL (ref 0.50–1.10)

## 2013-01-23 LAB — HCG, SERUM, QUALITATIVE: Preg, Serum: NEGATIVE

## 2013-01-23 MED ORDER — FERROUS SULFATE 325 (65 FE) MG PO TABS: 325.0000 mg | ORAL_TABLET | Freq: Three times a day (TID) | ORAL | Status: DC

## 2013-01-23 MED ORDER — SODIUM CHLORIDE 0.9 % IV SOLN
1000.0000 mL | Freq: Once | INTRAVENOUS | Status: AC
  Administered 2013-01-23: 1000 mL via INTRAVENOUS

## 2013-01-23 MED ORDER — SODIUM CHLORIDE 0.9 % IV SOLN: 1000.0000 mL | INTRAVENOUS | Status: DC

## 2013-01-23 NOTE — ED Notes (Signed)
Pt reports vaginal bleeding for 1 month, has been to ob/gyn, and started on megace.  She now thinks her blood counts are low.

## 2013-01-23 NOTE — ED Provider Notes (Signed)
CSN: [id number]     Arrival date & time 01/23/13  8657 History  This chart was scribed for Celene Kras, MD by Bennett Scrape, ED Scribe. This patient was seen in room APA12/APA12 and the patient's care was started at 7:53 AM.   Chief Complaint  Patient presents with  . Vaginal Bleeding    The history is provided by the patient. No language interpreter was used.   HPI Comments: [name] is a 47 y.o. female with a h/o anemia who presents to the Emergency Department complaining of one month of waxing and waning, persistent vaginal bleeding since Sept 7th, 2014. She reports that the bleeding will increase and "be like water" one day and almost be absent the next but has gradually increased over the past few days causing her increased concern. However, she admits that the vaginal bleeding is light currently. She lists nausea, weakness, HA and hot/cold sweats as associated symptoms.She was started on Megace 4 weeks ago by her OB-GYN after having an US of the uterus performed during that visit that did not show any abnormalities including fibroids. She states that she called her OB-GYN and informed them of the symptoms but was told to continue taking the pills due to her doctor believing that the symptoms were most likely due to menopause. Her next appointment is next month and she states that an ablation was discussed to help with her symptioms. She reports one prior episode of similar vaginal bleeding around the age of 34 that resolved after a D&C. She denies any known diagnosis of the vaginal bleeding at that time and she denies having any reoccurring issues until now. She denies any syncopal spells, fevers, emesis and diarrhea as associated symptoms. She also has a h/o DM, HLD, HTN and schizoaffective disorder.   OB-GYN is [facility], Dr. Stephens November  Past Medical History  Diagnosis Date  . Arteriosclerotic cardiovascular disease (ASCVD)     Minimal at cath in [facility].stress nuclear  study in 8/08 with nl EF; neg stress echo in 2010  . Diabetes mellitus, type 2 2000    Onset in 2000; no insulin  . Hyperlipidemia   . Hypertension `    during treatment with Geodon  . Gastroesophageal reflux disease     Schatzki's ring  . Anemia, iron deficiency   . Alcohol abuse   . Depression   . Community acquired pneumonia 01/03/10, 05/2010, 04/2012    2011; with pleural effusion-[facility] acute resp failure; intubated in Jan 2014 ([facility] pneumonia)  . Obesity   . Schizoaffective disorder     requiring multiple psychiatric admissions  . Dysphagia   . Diastolic dysfunction     grade 2 per echo 2011  . Pulmonary hypertension 05/02/2012    Patient needs repeat echo in 06/2012   . History of alcohol abuse 07/22/2007    Qualifier: Diagnosis of  By: Rudene Anda     Past Surgical History  Procedure Laterality Date  . Dilation and curettage, diagnostic / therapeutic  1992  . Esophagogastroduodenoscopy  09/16/08    Dr. Ronni Rumble hiatal hernia/excoriations involving the cardia and mucosa consistent with trauma, antral erosions  of linear petechiae ? gastritis versus early gastric antral vascular  ectasia.Marland Kitchen biopsy showed reactive gastropathy. No H. pylori.  . Esophagogastroduodenoscopy  09/2007    Dr. Rinaldo Ratel ring, dilated to 56 French Maloney dilator, small hiatal hernia, antral erosions, biopsies reactive gastropathy.  Gaspar Bidding dilation  07/17/2011    [facility] sedation-->distal esophageal stricture  s/p dilation, chronic gastritis, multiple ulcers in stomach. no h.pylori  . Colonoscopy  01/2006    internal hemorrhoids  . Colonoscopy  01/10/2012    Procedure: COLONOSCOPY;  Surgeon: Corbin Ade, MD;  Location: [facility];  Service: Endoscopy;  Laterality: N/A;  entered cecum @ [phone] ; total cecal withdrawal time = 8 minutes   Family History  Problem Relation Age of Onset  . Colon cancer Other   . Hypertension Mother   . Stroke Father     deceased at age 42  . Heart disease  Sister   . Anesthesia problems Neg Hx   . Hypotension Neg Hx   . Malignant hyperthermia Neg Hx   . Pseudochol deficiency Neg Hx    History  Substance Use Topics  . Smoking status: Current Every Day Smoker -- 1.00 packs/day for 30 years    Types: Cigarettes    Start date: 05/18/2012  . Smokeless tobacco: Never Used     Comment: pulmonary dr gave something to try to inhale  . Alcohol Use: 0.0 oz/week     Comment: every now and then   No OB history provided.  Review of Systems  Constitutional: Negative for fever.  Gastrointestinal: Positive for nausea. Negative for vomiting and diarrhea.  Genitourinary: Positive for vaginal bleeding. Negative for vaginal pain.  Neurological: Positive for weakness and headaches. Negative for syncope.  A complete 10 system review of systems was obtained and all systems are negative except as noted in the HPI and PMH.   Allergies  Metronidazole; Orange; Shrimp; Penicillins; Sulfonamide derivatives; Glipizide; and Sulfamethoxazole-trimethoprim  Home Medications   Current Outpatient Rx  Name  Route  Sig  Dispense  Refill  . albuterol (PROVENTIL HFA;VENTOLIN HFA) 108 (90 BASE) MCG/ACT inhaler   Inhalation   Inhale 2 puffs into the lungs 3 (three) times daily as needed for wheezing or shortness of breath.   1 Inhaler   2   . esomeprazole (NEXIUM) 20 MG capsule   Oral   Take 1 capsule (20 mg total) by mouth daily.   30 capsule   3   . Garcinia Cambogia-Chromium 500-200 MG-MCG TABS   Oral   Take 2 tablets by mouth 2 (two) times daily.         . hydrochlorothiazide (HYDRODIURIL) 25 MG tablet   Oral   Take 1 tablet (25 mg total) by mouth daily.   30 tablet   3   . hydrocortisone (PROCTOZONE-HC) 2.5 % rectal cream   Rectal   Place rectally 2 (two) times daily.   30 g   0   . ibuprofen (ADVIL,MOTRIN) 200 MG tablet   Oral   Take 400 mg by mouth every 8 (eight) hours as needed. Aches and pains, fever         . Insulin Glargine  (LANTUS SOLOSTAR) 100 UNIT/ML SOPN   Subcutaneous   Inject 24 Units into the skin at bedtime.         . lovastatin (MEVACOR) 20 MG tablet   Oral   Take 20 mg by mouth at bedtime.         . megestrol (MEGACE) 40 MG tablet   Oral   Take 80 mg by mouth daily.         . metFORMIN (GLUCOPHAGE) 1000 MG tablet   Oral   Take 1 tablet (1,000 mg total) by mouth 2 (two) times daily.   60 tablet   5   . metoprolol (LOPRESSOR) 50 MG tablet  Oral   Take 50 mg by mouth 2 (two) times daily.         . mometasone (NASONEX) 50 MCG/ACT nasal spray   Nasal   Place 4 sprays into the nose daily.   17 g   2   . NOVOFINE 32G X 6 MM MISC      USE AS DIRECTED BEFORE BREAKFAST.   100 each   11   . ondansetron (ZOFRAN ODT) 8 MG disintegrating tablet   Oral   Take 1 tablet (8 mg total) by mouth every 8 (eight) hours as needed for nausea.   20 tablet   0   . traZODone (DESYREL) 100 MG tablet   Oral   Take 1 tablet by mouth at bedtime as needed.         . ziprasidone (GEODON) 80 MG capsule   Oral   Take 1 capsule (80 mg total) by mouth 2 (two) times daily with a meal.   60 capsule   0    Triage Vitals: BP 146/87  Pulse 103  Temp(Src) 99.2 F (37.3 C) (Oral)  Resp 20  Ht 5\' 4"  (1.626 m)  Wt 211 lb (95.709 kg)  BMI 36.2 kg/m2  SpO2 99%  LMP 12/21/2012  Physical Exam  Nursing note and vitals reviewed. Constitutional: She appears well-developed and well-nourished. No distress.  HENT:  Head: Normocephalic and atraumatic.  Right Ear: External ear normal.  Left Ear: External ear normal.  Eyes: Conjunctivae are normal. Right eye exhibits no discharge. Left eye exhibits no discharge. No scleral icterus.  Neck: Neck supple. No tracheal deviation present.  Cardiovascular: Normal rate, regular rhythm and intact distal pulses.   Pulmonary/Chest: Effort normal and breath sounds normal. No stridor. No respiratory distress. She has no wheezes. She has no rales.  Abdominal: Soft.  Bowel sounds are normal. She exhibits no distension. There is no tenderness. There is no rebound and no guarding.  Musculoskeletal: She exhibits no edema and no tenderness.  Neurological: She is alert. She has normal strength. No sensory deficit. Cranial nerve deficit:  no gross defecits noted. She exhibits normal muscle tone. She displays no seizure activity. Coordination normal.  Skin: Skin is warm and dry. No rash noted.  Psychiatric: She has a normal mood and affect.   pelvic exam deferred: Recently done an OB/GYN's office  ED Course  Procedures (including critical care time)  DIAGNOSTIC STUDIES: Oxygen Saturation is 99% on room air, normal by my interpretation.    COORDINATION OF CARE: 7:57 AM-Discussed treatment plan which includes  CBC panel, BMP and hCG with pt at bedside and pt agreed to plan.   Labs Review Labs Reviewed  CBC WITH DIFFERENTIAL - Abnormal; Notable for the following:    Hemoglobin 11.7 (*)    HCT 34.6 (*)    RDW 17.7 (*)    All other components within normal limits  BASIC METABOLIC PANEL - Abnormal; Notable for the following:    Sodium 134 (*)    Glucose, Bld 207 (*)    GFR calc non Af Amer 69 (*)    GFR calc Af Amer 81 (*)    All other components within normal limits  HCG, SERUM, QUALITATIVE   Imaging Review No results found.  EKG Interpretation   None       MDM   1. Vaginal bleeding    Patient is having persistent vaginal bleeding. This morning she's not having any significant bleeding. Patient is not significantly anemic. I recommended she  follow up with her OB/GYN doctor for further discussion of D&C versus ablation procedure.  At this time there does not appear to be any evidence of an acute emergency medical condition and the patient appears stable for discharge with appropriate outpatient follow up.  I personally performed the services described in this documentation, which was scribed in my presence.  The recorded information has been  reviewed and is accurate.   Celene Kras, MD 01/23/13 [phone]

## 2013-01-23 NOTE — Assessment & Plan Note (Signed)
The patient has been taking over-the-counter diet supplements for the last month, with symptoms of diarrhea, uterine bleeding per above, heat/cold intolerance, loss of appetite, and weight loss. Symptoms likely represent side effects from her medication, but will check TSH per patient request -Check TSH

## 2013-01-23 NOTE — Telephone Encounter (Signed)
Spoke with pt and let her know pap was normal. JSY

## 2013-01-23 NOTE — Progress Notes (Signed)
HPI The patient is a 47 y.o. female with a history of bipolar disorder, diabetes, tobacco abuse, iron deficiency anemia due to menorrhagia, presenting for an acute visit for vaginal bleeding.  The patient notes that her current menstrual cycle started 12/21/12. Since that time, she has had daily bleeding, going through 2-10 pads/day.  She has been followed by GYN, last visit this past Friday, where they discussed ablation, though the patient is concerned that this procedure might not solve her problem.  She prefers a D&C, though it's unclear if this is her idea or was recommended by her gynecologist.  She notes lower abdominal cramps which have been present all month.  She has been taking Garcinia for the last month.  No NSAID usage, no blood thinners, no trauma.  Transvaginal ultrasound last week showed a probable endometrial polyp.  The patient requests that we check her thyroid level. The patient notes a 1st cousin with "thyroid problems".  The patient describes of symptoms of feeling "freezing cold", then "hot all of a sudden" (few weeks).    She notes occasional loose stools, 3-4/day (few weeks).  The patient notes low appetite since starting Garcinia and coffee extract for weight loss.  No skin/hair changes.  She has lost about 3 lbs in the last month.  ROS: General: no fevers, chills Skin: no rash HEENT: no blurry vision, hearing changes, sore throat Pulm: no dyspnea, coughing, wheezing CV: no chest pain, palpitations, shortness of breath Abd: see HPI GU: no dysuria, hematuria, polyuria Ext: no arthralgias, myalgias Neuro: no weakness, numbness, or tingling  Filed Vitals:   01/23/13 1449  BP: 144/83  Pulse: 107  Temp: 97.9 F (36.6 C)    PEX General: alert, cooperative, and in no apparent distress HEENT: pupils equal round and reactive to light, vision grossly intact, oropharynx clear and non-erythematous  Neck: supple Lungs: clear to ascultation bilaterally, normal work of  respiration, no wheezes, rales, ronchi Heart: regular rate and rhythm, no murmurs, gallops, or rubs Abdomen: soft, non-tender, non-distended, normal bowel sounds Extremities: no cyanosis, clubbing, or edema Neurologic: alert & oriented X3, cranial nerves II-XII intact, strength grossly intact, sensation intact to light touch  Current Outpatient Prescriptions on File Prior to Visit  Medication Sig Dispense Refill  . albuterol (PROVENTIL HFA;VENTOLIN HFA) 108 (90 BASE) MCG/ACT inhaler Inhale 2 puffs into the lungs 3 (three) times daily as needed for wheezing or shortness of breath.  1 Inhaler  2  . esomeprazole (NEXIUM) 20 MG capsule Take 1 capsule (20 mg total) by mouth daily.  30 capsule  3  . Garcinia Cambogia-Chromium 500-200 MG-MCG TABS Take 2 tablets by mouth 2 (two) times daily.      . hydrochlorothiazide (HYDRODIURIL) 25 MG tablet Take 1 tablet (25 mg total) by mouth daily.  30 tablet  3  . hydrocortisone (PROCTOZONE-HC) 2.5 % rectal cream Place rectally 2 (two) times daily.  30 g  0  . ibuprofen (ADVIL,MOTRIN) 200 MG tablet Take 400 mg by mouth every 8 (eight) hours as needed. Aches and pains, fever      . Insulin Glargine (LANTUS SOLOSTAR) 100 UNIT/ML SOPN Inject 24 Units into the skin at bedtime.      . lovastatin (MEVACOR) 20 MG tablet Take 20 mg by mouth at bedtime.      . megestrol (MEGACE) 40 MG tablet Take 80 mg by mouth daily.      . metFORMIN (GLUCOPHAGE) 1000 MG tablet Take 1 tablet (1,000 mg total) by mouth 2 (two)  times daily.  60 tablet  5  . metoprolol (LOPRESSOR) 50 MG tablet Take 50 mg by mouth 2 (two) times daily.      . mometasone (NASONEX) 50 MCG/ACT nasal spray Place 4 sprays into the nose daily.  17 g  2  . NOVOFINE 32G X 6 MM MISC USE AS DIRECTED BEFORE BREAKFAST.  100 each  11  . ondansetron (ZOFRAN ODT) 8 MG disintegrating tablet Take 1 tablet (8 mg total) by mouth every 8 (eight) hours as needed for nausea.  20 tablet  0  . traZODone (DESYREL) 100 MG tablet Take  1 tablet by mouth at bedtime as needed.      . ziprasidone (GEODON) 80 MG capsule Take 1 capsule (80 mg total) by mouth 2 (two) times daily with a meal.  60 capsule  0   No current facility-administered medications on file prior to visit.    Assessment/Plan

## 2013-01-23 NOTE — ED Notes (Signed)
Pt stated she is having a slight headache. Stated she also does not want any blood transfusions if needed. Not for religious believes.  Wants to have pills if possible.

## 2013-01-23 NOTE — Assessment & Plan Note (Signed)
The patient presents with a one-month history of dysfunctional uterine bleeding. The patient has been following with gynecology, and workup so far has revealed only an endometrial polyp by ultrasound. Chart review reveals that hemoglobin has remained fairly stable, from 12.0 on September 9, to 11.7 this morning, though her MCV has steadily decreased to 78.3 today, likely representing iron loss through to her blood loss.  The etiology of this bleeding is unclear, but differential includes endometrial polyp (seen on [name]) vs endometrial cancer (less likely) versus Garcinia usage (no Uptodate info, but google search reveals forums of women noting this side effect). -Recommend discontinuing Garcinia -Increase iron supplement from daily to 3 times per day -Continue to followup with gynecologist in [place] -Patient requests to change to a gynecologist here in [place], referral sent -Return in 3-4 weeks for recheck

## 2013-01-23 NOTE — ED Notes (Signed)
nad noted prior to dc. Dc instructions reviewed and understands f/u care. Ambulated out without difficulty.

## 2013-01-23 NOTE — Telephone Encounter (Signed)
Left message x 1. JSY 

## 2013-01-23 NOTE — Patient Instructions (Signed)
General Instructions: For your bleeding, please continue to follow-up with your Gynecologist in [place].  We are making you an appointment with Gynecology at [facility] to start to follow-up. -I recommend stopping Garcinia, as this may be related to your bleeding -Please increase your iron tablets to 3 times per day  We are checking a thyroid function test today.  We will call you with the results if this is abnormal; otherwise you can assume it was normal.  Please return for a follow-up visit in 3-4 weeks.  Treatment Goals:  Goals (1 Years of Data) as of 01/23/13         As of Today As of Today As of Today 01/16/13 01/15/13     Blood Pressure    . Blood Pressure < 140/90  144/83 138/83 146/87 112/70 112/82     Result Component    . HEMOGLOBIN A1C < 7.0          . LDL CALC < 100            Progress Toward Treatment Goals:  Treatment Goal 12/10/2012  Hemoglobin A1C improved  Blood pressure at goal  Stop smoking smoking more    Self Care Goals & Plans:  Self Care Goal 01/23/2013  Manage my medications take my medicines as prescribed; refill my medications on time  Monitor my health check my feet daily; keep track of my blood glucose  Eat healthy foods eat foods that are low in salt; eat baked foods instead of fried foods  Be physically active -  Stop smoking -    Home Blood Glucose Monitoring 12/10/2012  Check my blood sugar 2 times a day  When to check my blood sugar before breakfast; before dinner     Care Management & Community Referrals:  Referral 01/23/2013  Referrals made for care management support none needed  Referrals made to community resources -

## 2013-01-25 NOTE — Progress Notes (Signed)
Case discussed with Dr. Brown at the time of the visit.  We reviewed the resident's history and exam and pertinent patient test results.  I agree with the assessment, diagnosis, and plan of care documented in the resident's note. 

## 2013-01-26 ENCOUNTER — Telehealth: Payer: Self-pay | Admitting: Pulmonary Disease

## 2013-01-26 ENCOUNTER — Encounter: Payer: Self-pay | Admitting: Pulmonary Disease

## 2013-01-26 ENCOUNTER — Telehealth: Payer: Self-pay | Admitting: *Deleted

## 2013-01-26 NOTE — Telephone Encounter (Signed)
I spoke with pt. She stated she was told we had an hypnotist in our practice to help stop smoking. I advised her we do not have one in our practice. She was stating she could have sworn Dr. Vassie Loll told her this. She will call her PCP. Nothing further needed

## 2013-01-26 NOTE — Telephone Encounter (Signed)
Pt would like for you to call her to discuss results of recent labs, please call at [phone]

## 2013-01-28 ENCOUNTER — Encounter: Payer: Self-pay | Admitting: Internal Medicine

## 2013-02-06 ENCOUNTER — Ambulatory Visit: Payer: Self-pay | Admitting: Pulmonary Disease

## 2013-02-06 ENCOUNTER — Ambulatory Visit (INDEPENDENT_AMBULATORY_CARE_PROVIDER_SITE_OTHER): Payer: Medicare Other | Admitting: Pulmonary Disease

## 2013-02-06 ENCOUNTER — Encounter: Payer: Self-pay | Admitting: Pulmonary Disease

## 2013-02-06 VITALS — BP 124/80 | HR 92 | Ht 65.0 in | Wt 208.8 lb

## 2013-02-06 DIAGNOSIS — J449 Chronic obstructive pulmonary disease, unspecified: Secondary | ICD-10-CM | POA: Diagnosis not present

## 2013-02-06 DIAGNOSIS — G4710 Hypersomnia, unspecified: Secondary | ICD-10-CM

## 2013-02-06 DIAGNOSIS — G4719 Other hypersomnia: Secondary | ICD-10-CM

## 2013-02-06 NOTE — Progress Notes (Signed)
  Subjective:    Patient ID: [name], female    DOB: 11/11/1965, 47 y.o.   MRN: [id number]  HPI 47 y.o.bipolar smoker referred for evaluation of dyspnea  She has a PMHX of DM2 (A1c 8.2), HLD, bipolar disorder, schizoaffective disorder, and alcohol abuse.  Pt last seen 01/2010. former [facility] pt. >>dyspnea was attributed to deconditioning and anxiety   She was admitted to [facility] on 04/28/2012 on transfer from [facility] (admitted on 04/20/2012) with severe CAP in setting of acute respiratory failure requiring intubation on 04/20/2012 X 1 week. Resp virus panel positive for human metapneumovirus  04/21/12 - TTE >> LVEF 60-65%, estimated PASP  Rpt echo 6/14 nml LVEF, RVSP 37  Chest x-ray on 05/13/12 was normal.  She had a low risk Myoview study on 4/14  Spirometry showed mild airway obstruction with FEV1 of 73% FVC of 90% and ratio 67. Smaller airways were moderately decreased at 40%. Bronchodilator testing was not performed.  She smokes about a pack per day    02/06/2013  Chief Complaint  Patient presents with  . Follow-up    Pt reports her breathing has been better. No cough, no wheezing, no chest tx. She exercised yesterday and did fine.    Sleep study was scheduled last visit,  But she did not proceed with this. Feels that Sleepiness better , taking coffee bean extract, she is able to go zumba class, works at [employer] flu shot Continues to smoke a pack per day and requests referral to hypnotist  Review of Systems neg for any significant sore throat, dysphagia, itching, sneezing, nasal congestion or excess/ purulent secretions, fever, chills, sweats, unintended wt loss, pleuritic or exertional cp, hempoptysis, orthopnea pnd or change in chronic leg swelling. Also denies presyncope, palpitations, heartburn, abdominal pain, nausea, vomiting, diarrhea or change in bowel or urinary habits, dysuria,hematuria, rash, arthralgias, visual complaints, headache, numbness weakness or  ataxia.     Objective:   Physical Exam  Gen. Pleasant, well-nourished, in no distress ENT - no lesions, no post nasal drip Neck: No JVD, no thyromegaly, no carotid bruits Lungs: no use of accessory muscles, no dullness to percussion, clear without rales or rhonchi  Cardiovascular: Rhythm regular, heart sounds  normal, no murmurs or gallops, no peripheral edema Musculoskeletal: No deformities, no cyanosis or clubbing        Assessment & Plan:

## 2013-02-06 NOTE — Assessment & Plan Note (Signed)
Smoking cessation and lifestyle changes remain primary interventions here and this was emphasized during visit Further followup can be with PCP We discussed signs of COPD flare and she can call us should this occur.

## 2013-02-06 NOTE — Patient Instructions (Signed)
You have COPD (bronchitis & emphysema) due to smoking YOu have to STOP smoking - Ok to try electronic cigarette We will refer you to pulmonary rehab program

## 2013-02-06 NOTE — Assessment & Plan Note (Signed)
Likely related to meds- she did not proceed with sleep study to rule out sleep apnea

## 2013-02-12 ENCOUNTER — Telehealth: Payer: Self-pay | Admitting: *Deleted

## 2013-02-12 NOTE — Telephone Encounter (Signed)
Pt wants to discuss weight loss method, see it is safe with her medicines that are prescribed.

## 2013-02-17 ENCOUNTER — Encounter: Payer: Self-pay | Admitting: Internal Medicine

## 2013-02-17 ENCOUNTER — Ambulatory Visit (INDEPENDENT_AMBULATORY_CARE_PROVIDER_SITE_OTHER): Payer: Medicare Other | Admitting: Internal Medicine

## 2013-02-17 VITALS — BP 131/90 | HR 87 | Temp 98.7°F | Ht 64.0 in | Wt 206.3 lb

## 2013-02-17 DIAGNOSIS — E119 Type 2 diabetes mellitus without complications: Secondary | ICD-10-CM

## 2013-02-17 LAB — POCT GLYCOSYLATED HEMOGLOBIN (HGB A1C): Hemoglobin A1C: 7.5

## 2013-02-17 NOTE — Progress Notes (Signed)
Subjective:     Patient ID: [name], female   DOB: [birth year], 47 y.o.   MRN: [id number]  HPI The patient is a 47 YO female with PMH of DM II, COPD, smoking, bipolar, GERD, iron deficiency anemia, obesity who presents to the clinic wanting diet supplements. She is currently taking green coffee bean extract and thinks that it gives her more energy. Originally she wanted to try medication called Belviq. However upon reading about this medication I became concerned about several interactions with her medications including Geodon, Zofran. I did discuss these with her and upon hearing how the medication worked (by interacting with her brain chemicals).  She then brought up raspberry ketones which she also had heard about with [name]. We also discussed diet, she is trying to lose weight. She states that she's been starting exercising and has lost 2 pounds since last week. She states that she does have much money to buy food and therefore she's not sure that what she is eating as the most appropriate. We discussed whether she would be open to meeting with [name], our nutrition and diabetic educator. She stated that she had seen her in the past and would be open to seeing her again. She is not having any complaints at today's visit.   Review of Systems  Constitutional: Positive for activity change. Negative for fever, chills, diaphoresis, appetite change, fatigue and unexpected weight change.       Exercising more  Respiratory: Negative for cough, chest tightness, shortness of breath and wheezing.   Cardiovascular: Negative for chest pain, palpitations and leg swelling.  Gastrointestinal: Negative for nausea, vomiting, abdominal pain, diarrhea and constipation.  Endocrine: Negative.   Musculoskeletal: Negative.   Skin: Negative.   Neurological: Negative.   Psychiatric/Behavioral: Negative.        Objective:   Physical Exam  Constitutional: She is oriented to person, place, and time. She  appears well-developed and well-nourished. No distress.  HENT:  Head: Normocephalic and atraumatic.  Eyes: EOM are normal. Pupils are equal, round, and reactive to light.  Neck: Normal range of motion. Neck supple.  Cardiovascular: Normal rate and regular rhythm.   No murmur heard. Pulmonary/Chest: Effort normal and breath sounds normal. No respiratory distress.  Abdominal: Soft. Bowel sounds are normal. She exhibits no distension. There is no tenderness. There is no rebound.  Musculoskeletal: Normal range of motion. She exhibits no edema and no tenderness.  Neurological: She is alert and oriented to person, place, and time. No cranial nerve deficit.  Skin: Skin is warm and dry.       Assessment:    1. Weight loss supplement request-the patient was requesting a medication however I did not feel that it was appropriate and did not prescribe it for her. I did also read about raspberry ketones and it does not seem that there is anything harmful about them so I advised her that she could trial them. I advised her that there is no medical or scientific incidence for raspberry ketones. She does agree to go see [name] our diabetic and nutrition educator.  2. Diabetes-did recheck hemoglobin A1c at today's visit the diabetic foot exam. Her hemoglobin A1c is 7.5 which is down from 7.7. No adjustments to her medical regimen and recommended that she followup with her PCP at next available visit.  3. Disposition-patient will followup with her PCP at next available and will see [facility].

## 2013-02-17 NOTE — Patient Instructions (Signed)
I think that it would be okay for you to try the Raspberry ketones but I do not think it would be safe to have you try Belviq.   I would like you to go to [facility] for some advice about food and healthy cheap choices.   Call us back with any questions or problems at [phone].  1200 Calorie Diabetic Diet The 1200 calorie diabetic diet limits calories to 1200 each day. Following this diet and making healthy meal choices can help improve overall health. It controls blood glucose (sugar) levels and can also help lower blood pressure and cholesterol.  SERVING SIZES Measuring foods and serving sizes helps to make sure you are getting the right amount of food. The list below tells how big or small some common serving sizes are.   1 oz.........4 stacked dice.  3 oz........Marland KitchenDeck of cards.  1 tsp.......Marland KitchenTip of little finger.  1 tbs......Marland KitchenMarland KitchenThumb.  2 tbs.......Marland KitchenGolf ball.   cup......Marland KitchenHalf of a fist.  1 cup.......Marland KitchenA fist. GUIDELINES FOR CHOOSING FOODS The goal of this diet is to eat a variety of foods and limit calories to 1200 each day. This can be done by choosing foods that are low in calories and fat. The diet also suggests eating small amounts of food frequently. Doing this helps control your blood glucose levels, so they do not get too high or too low. Each meal or snack may include a protein food source to help you feel more satisfied. Try to eat about the same amount of food around the same time each day. This includes weekend days, travel days, and days off work. Space your meals about 4 to 5 hours apart, and add a snack between them, if you wish.  For example, a daily food plan could include breakfast, a morning snack, lunch, dinner, and an evening snack. Healthy meals and snacks have different types of foods, including whole grains, vegetables, fruits, lean meats, poultry, fish, and dairy products. As you plan your meals, select a variety of foods. Choose from the bread and starch,  vegetable, fruit, dairy, and meat/protein groups. Examples of foods from each group are listed below, with their suggested serving sizes. Use measuring cups and spoons to become familiar with what a healthy portion looks like. Bread and Starch Each serving equals 15 grams of carbohydrate.  1 slice bread.   bagel.   cup cold cereal (unsweetened).   cup hot cereal or mashed potatoes.  1 small potato (size of a computer mouse).   cup cooked pasta or rice.   English muffin.  1 cup broth-based soup.  3 cups of popcorn.  4 to 6 whole-wheat crackers.   cup cooked beans, peas, or corn. Vegetables Each serving equals 5 grams of carbohydrate.   cup cooked vegetables.  1 cup raw vegetables.   cup tomato or vegetable juice. Fruit Each serving equals 15 grams of carbohydrate.  1 small apple or orange.  1  cup watermelon or strawberries.   cup applesauce (no sugar added).  2 tbs raisins.   banana.   cup canned fruit, packed in water or in its own juice.   cup unsweetened fruit juice. Dairy Each serving equals 12 to 15 grams of carbohydrate.  1 cup fat-free milk.  6 oz artificially sweetened yogurt or plain yogurt.  1 cup low-fat buttermilk.  1 cup soy milk.  1 cup almond milk. Meat/Protein  1 large egg.  2 to 3 oz meat, poultry, or fish.   cup low-fat cottage cheese.  1 tbs peanut butter.  1 oz low-fat cheese.   cup tuna, packed in water.   cup tofu. Fat  1 tsp oil.  1 tsp trans-fat-free margarine.  1 tsp butter.  1 tsp mayonnaise.  2 tbs avocado.  1 tbs salad dressing.  1 tbs cream cheese.  2 tbs sour cream. SAMPLE 1200 CALORIE DIET PLAN Breakfast  1 cup fat-free milk (1 carb serving).  1 small orange (1 carb serving).  1 scrambled egg.  1 slice whole-wheat toast (1 carb serving). Lunch  Sandwich  2 slices whole-wheat bread (2 carb servings).  2 oz lean meat.  2 tsp reduced fat mayonnaise.  1 lettuce  leaf.  2 slices tomato.  1 cup carrot sticks. Afternoon Snack  1 small apple (1 carb serving).  1 string cheese. Dinner  2 oz meat.  1 small baked potato (1 carb serving).  1 tsp trans-fat-free margarine.  1 cup steamed broccoli.  1 cup fat-free milk (1 carb serving). Evening Snack   small banana (1 carb serving).  6 vanilla wafers (1 carb serving). MEAL PLAN You can use this worksheet to help you make a daily meal plan based on the 1200 calorie diabetic diet suggestions. If you are using this plan to help you control your blood glucose, you may interchange carbohydrate containing foods (dairy, starches, and fruits). Select a variety of fresh foods of varying colors and flavors. The total amount of carbohydrate in your meals or snacks is more important than making sure you include all of the food groups every time you eat. You can choose from approximately this many of the following foods to build your day's meals:  6 Starches.  3 Vegetables.  2 Fruits.  2 Dairy.  4 to 6 oz Meat/Protein.  Up to 3 Fats. Your dietician can use this worksheet to help you decide how many servings and which types of foods are right for you. BREAKFAST Food Group and Servings / Food Choice Starch _________________________________________________________ Dairy __________________________________________________________ Fruit ___________________________________________________________ Meat/Protein____________________________________________________ Fat ____________________________________________________________ LUNCH Food Group and Servings / Food Choice  Starch _________________________________________________________ Meat/Protein ___________________________________________________ Vegetables _____________________________________________________ Fruit __________________________________________________________ Dairy __________________________________________________________ Fat  ____________________________________________________________ [name] Food Group and Servings / Food Choice Dairy __________________________________________________________ Fruit ___________________________________________________________ Starch __________________________________________________________ Meat/Protein____________________________________________________ [name] Food Group and Servings / Food Choice Starch _________________________________________________________ Meat/Protein ___________________________________________________ Dairy __________________________________________________________ Vegetables _____________________________________________________ Fruit __________________________________________________________ Fat ____________________________________________________________ [name] Food Group and Servings / Food Choice Fruit ___________________________________________________________ Meat/Protein ____________________________________________________ Dairy __________________________________________________________ Starch __________________________________________________________ DAILY TOTALS Starches _________________________ Vegetables _______________________ Fruits ____________________________ Dairy ____________________________ Meat/Protein______________________ Fats _____________________________ Document Released: 10/23/2004 Document Revised: 06/25/2011 Document Reviewed: 02/17/2009 Exit[employer] Patient Information 2014 [employer]

## 2013-02-18 NOTE — Progress Notes (Signed)
Case discussed with Dr. Kollar soon after the resident saw the patient.  We reviewed the resident's history and exam and pertinent patient test results.  I agree with the assessment, diagnosis, and plan of care documented in the resident's note. 

## 2013-02-19 ENCOUNTER — Other Ambulatory Visit (HOSPITAL_COMMUNITY): Payer: Self-pay

## 2013-02-19 DIAGNOSIS — F319 Bipolar disorder, unspecified: Secondary | ICD-10-CM | POA: Diagnosis not present

## 2013-02-23 ENCOUNTER — Encounter: Payer: Self-pay | Admitting: Obstetrics & Gynecology

## 2013-02-25 ENCOUNTER — Encounter (HOSPITAL_COMMUNITY): Admission: RE | Payer: Self-pay | Source: Ambulatory Visit

## 2013-02-25 ENCOUNTER — Ambulatory Visit (HOSPITAL_COMMUNITY)
Admission: RE | Admit: 2013-02-25 | Payer: Medicare Other | Source: Ambulatory Visit | Admitting: Obstetrics & Gynecology

## 2013-02-25 SURGERY — DILATATION & CURETTAGE/HYSTEROSCOPY WITH THERMACHOICE ABLATION
Anesthesia: General

## 2013-02-26 ENCOUNTER — Other Ambulatory Visit: Payer: Self-pay | Admitting: Internal Medicine

## 2013-03-05 ENCOUNTER — Encounter: Payer: Medicare Other | Admitting: Obstetrics & Gynecology

## 2013-03-17 DIAGNOSIS — E119 Type 2 diabetes mellitus without complications: Secondary | ICD-10-CM | POA: Diagnosis not present

## 2013-03-20 ENCOUNTER — Encounter: Payer: Self-pay | Admitting: Obstetrics & Gynecology

## 2013-03-20 ENCOUNTER — Ambulatory Visit (INDEPENDENT_AMBULATORY_CARE_PROVIDER_SITE_OTHER): Payer: Medicare Other | Admitting: Obstetrics & Gynecology

## 2013-03-20 VITALS — BP 134/87 | HR 93 | Temp 97.3°F | Ht 65.0 in | Wt 201.5 lb

## 2013-03-20 DIAGNOSIS — Z1231 Encounter for screening mammogram for malignant neoplasm of breast: Secondary | ICD-10-CM

## 2013-03-20 DIAGNOSIS — Z3202 Encounter for pregnancy test, result negative: Secondary | ICD-10-CM | POA: Diagnosis not present

## 2013-03-20 NOTE — Patient Instructions (Signed)

## 2013-03-20 NOTE — Progress Notes (Signed)
Patient ID: [name], female   DOB: [birth year], 47 y.o.   MRN: [id number] Pt had issues which resolved.  She declined a physician visit. Her pap was UTD.  Her UPT was neg. She was scheduled for a mammogram.   Iram Lundberg L. Harraway-Smith, M.D., [place] [place]

## 2013-03-29 ENCOUNTER — Inpatient Hospital Stay (HOSPITAL_COMMUNITY)
Admission: AD | Admit: 2013-03-29 | Discharge: 2013-03-29 | Disposition: A | Discharge status: Home / Self Care | Payer: Medicare Other | Source: Ambulatory Visit | Attending: Obstetrics & Gynecology | Admitting: Obstetrics & Gynecology

## 2013-03-29 ENCOUNTER — Encounter (HOSPITAL_COMMUNITY): Payer: Self-pay | Admitting: *Deleted

## 2013-03-29 DIAGNOSIS — R109 Unspecified abdominal pain: Secondary | ICD-10-CM | POA: Insufficient documentation

## 2013-03-29 DIAGNOSIS — N949 Unspecified condition associated with female genital organs and menstrual cycle: Secondary | ICD-10-CM

## 2013-03-29 DIAGNOSIS — F17200 Nicotine dependence, unspecified, uncomplicated: Secondary | ICD-10-CM | POA: Diagnosis not present

## 2013-03-29 DIAGNOSIS — R102 Pelvic and perineal pain: Secondary | ICD-10-CM

## 2013-03-29 DIAGNOSIS — Z3202 Encounter for pregnancy test, result negative: Secondary | ICD-10-CM | POA: Diagnosis not present

## 2013-03-29 LAB — URINALYSIS, ROUTINE W REFLEX MICROSCOPIC
Bilirubin Urine: NEGATIVE
Glucose, UA: NEGATIVE mg/dL
Hgb urine dipstick: NEGATIVE
Ketones, ur: 15 mg/dL — AB
Protein, ur: NEGATIVE mg/dL

## 2013-03-29 LAB — HCG, SERUM, QUALITATIVE: Preg, Serum: NEGATIVE

## 2013-03-29 LAB — WET PREP, GENITAL
Clue Cells Wet Prep HPF POC: NONE SEEN
Yeast Wet Prep HPF POC: NONE SEEN

## 2013-03-29 MED ORDER — IBUPROFEN 800 MG PO TABS
800.0000 mg | ORAL_TABLET | Freq: Once | ORAL | Status: AC
  Administered 2013-03-29: 800 mg via ORAL
  Filled 2013-03-29: qty 1

## 2013-03-29 NOTE — Discharge Instructions (Signed)
Abdominal Pain, Women °Abdominal (stomach, pelvic, or belly) pain can be caused by many things. It is important to tell your doctor: °· The location of the pain. °· Does it come and go or is it present all the time? °· Are there things that start the pain (eating certain foods, exercise)? °· Are there other symptoms associated with the pain (fever, nausea, vomiting, diarrhea)? °All of this is helpful to know when trying to find the cause of the pain. °CAUSES  °· Stomach: virus or bacteria infection, or ulcer. °· Intestine: appendicitis (inflamed appendix), regional ileitis (Crohn's disease), ulcerative colitis (inflamed colon), irritable bowel syndrome, diverticulitis (inflamed diverticulum of the colon), or cancer of the stomach or intestine. °· Gallbladder disease or stones in the gallbladder. °· Kidney disease, kidney stones, or infection. °· Pancreas infection or cancer. °· Fibromyalgia (pain disorder). °· Diseases of the female organs: °· Uterus: fibroid (non-cancerous) tumors or infection. °· Fallopian tubes: infection or tubal pregnancy. °· Ovary: cysts or tumors. °· Pelvic adhesions (scar tissue). °· Endometriosis (uterus lining tissue growing in the pelvis and on the pelvic organs). °· Pelvic congestion syndrome (female organs filling up with blood just before the menstrual period). °· Pain with the menstrual period. °· Pain with ovulation (producing an egg). °· Pain with an IUD (intrauterine device, birth control) in the uterus. °· Cancer of the female organs. °· Functional pain (pain not caused by a disease, may improve without treatment). °· Psychological pain. °· Depression. °DIAGNOSIS  °Your doctor will decide the seriousness of your pain by doing an examination. °· Blood tests. °· X-rays. °· Ultrasound. °· CT scan (computed tomography, special type of X-ray). °· MRI (magnetic resonance imaging). °· Cultures, for infection. °· Barium enema (dye inserted in the large intestine, to better view it with  X-rays). °· Colonoscopy (looking in intestine with a lighted tube). °· Laparoscopy (minor surgery, looking in abdomen with a lighted tube). °· Major abdominal exploratory surgery (looking in abdomen with a large incision). °TREATMENT  °The treatment will depend on the cause of the pain.  °· Many cases can be observed and treated at home. °· Over-the-counter medicines recommended by your caregiver. °· Prescription medicine. °· Antibiotics, for infection. °· Birth control pills, for painful periods or for ovulation pain. °· Hormone treatment, for endometriosis. °· Nerve blocking injections. °· Physical therapy. °· Antidepressants. °· Counseling with a psychologist or psychiatrist. °· Minor or major surgery. °HOME CARE INSTRUCTIONS  °· Do not take laxatives, unless directed by your caregiver. °· Take over-the-counter pain medicine only if ordered by your caregiver. Do not take aspirin because it can cause an upset stomach or bleeding. °· Try a clear liquid diet (broth or water) as ordered by your caregiver. Slowly move to a bland diet, as tolerated, if the pain is related to the stomach or intestine. °· Have a thermometer and take your temperature several times a day, and record it. °· Bed rest and sleep, if it helps the pain. °· Avoid sexual intercourse, if it causes pain. °· Avoid stressful situations. °· Keep your follow-up appointments and tests, as your caregiver orders. °· If the pain does not go away with medicine or surgery, you may try: °· Acupuncture. °· Relaxation exercises (yoga, meditation). °· Group therapy. °· Counseling. °SEEK MEDICAL CARE IF:  °· You notice certain foods cause stomach pain. °· Your home care treatment is not helping your pain. °· You need stronger pain medicine. °· You want your IUD removed. °· You feel faint or   lightheaded. °· You develop nausea and vomiting. °· You develop a rash. °· You are having side effects or an allergy to your medicine. °SEEK IMMEDIATE MEDICAL CARE IF:  °· Your  pain does not go away or gets worse. °· You have a fever. °· Your pain is felt only in portions of the abdomen. The right side could possibly be appendicitis. The left lower portion of the abdomen could be colitis or diverticulitis. °· You are passing blood in your stools (bright red or black tarry stools, with or without vomiting). °· You have blood in your urine. °· You develop chills, with or without a fever. °· You pass out. °MAKE SURE YOU:  °· Understand these instructions. °· Will watch your condition. °· Will get help right away if you are not doing well or get worse. °Document Released: 01/28/2007 Document Revised: 06/25/2011 Document Reviewed: 02/17/2009 °ExitCare® Patient Information ©2014 [employer]. ° °

## 2013-03-29 NOTE — MAU Provider Note (Signed)
History     CSN: [id number]  Arrival date and time: 03/29/13 2956   First Provider Initiated Contact with Patient 03/29/13 0547      Chief Complaint  Patient presents with  . Hypertension  . Abdominal Pain   HPI  [name] is a 47 y.o. [id number] who presents today with lower abdominal pain. She rates the pain 7/10, ad states that it is at the belly button and below. She denies any nausea/vomiting or diarrhea. She has had this pain off and on, and has been seen in the clinic for this pain, and DUB.  She has also seen a GI doctor as well. She does report urgency to void, but that it does not feel like a bladder infection. She denies any vaginal discharge. She states that her LMP was 12/21/12. She states that urine pregnancy tests are usually negative, and she needs to have a blood test done.   Past Medical History  Diagnosis Date  . Arteriosclerotic cardiovascular disease (ASCVD)     Minimal at cath in [facility].stress nuclear study in 8/08 with nl EF; neg stress echo in 2010  . Diabetes mellitus, type 2 2000    Onset in 2000; no insulin  . Hyperlipidemia   . Hypertension `    during treatment with Geodon  . Gastroesophageal reflux disease     Schatzki's ring  . Anemia, iron deficiency   . Alcohol abuse   . Depression   . Community acquired pneumonia 01/03/10, 05/2010, 04/2012    2011; with pleural effusion-[facility] acute resp failure; intubated in Jan 2014 (HMPV pneumonia)  . Obesity   . Schizoaffective disorder     requiring multiple psychiatric admissions  . Dysphagia   . Diastolic dysfunction     grade 2 per echo 2011  . Pulmonary hypertension 05/02/2012    Patient needs repeat echo in 06/2012   . History of alcohol abuse 07/22/2007    Qualifier: Diagnosis of  By: Rudene Anda      Past Surgical History  Procedure Laterality Date  . Dilation and curettage, diagnostic / therapeutic  1992  . Esophagogastroduodenoscopy  09/16/08    Dr. Ronni Rumble hiatal  hernia/excoriations involving the cardia and mucosa consistent with trauma, antral erosions  of linear petechiae ? gastritis versus early gastric antral vascular  ectasia.Marland Kitchen biopsy showed reactive gastropathy. No H. pylori.  . Esophagogastroduodenoscopy  09/2007    Dr. Rinaldo Ratel ring, dilated to 56 French Maloney dilator, small hiatal hernia, antral erosions, biopsies reactive gastropathy.  Gaspar Bidding dilation  07/17/2011    [facility] sedation-->distal esophageal stricture s/p dilation, chronic gastritis, multiple ulcers in stomach. no h.pylori  . Colonoscopy  01/2006    internal hemorrhoids  . Colonoscopy  01/10/2012    Procedure: COLONOSCOPY;  Surgeon: Corbin Ade, MD;  Location: [facility];  Service: Endoscopy;  Laterality: N/A;  entered cecum @ [phone] ; total cecal withdrawal time = 8 minutes    Family History  Problem Relation Age of Onset  . Colon cancer Other   . Hypertension Mother   . Stroke Father     deceased at age 71  . Heart disease Sister   . Anesthesia problems Neg Hx   . Hypotension Neg Hx   . Malignant hyperthermia Neg Hx   . Pseudochol deficiency Neg Hx     History  Substance Use Topics  . Smoking status: Current Every Day Smoker -- 1.00 packs/day for 30 years    Types: Cigarettes  Start date: 05/18/2012  . Smokeless tobacco: Never Used     Comment: pulmonary dr gave something to try to inhale  . Alcohol Use: 0.0 oz/week     Comment: every now and then    Allergies:  Allergies  Allergen Reactions  . Metronidazole Shortness Of Breath and Swelling  . Orange Itching  . Shrimp [Shellfish Allergy] Shortness Of Breath and Itching    Takes Benadryl before eating shrimp.  Marland Kitchen Penicillins Hives and Swelling    Fever as well  . Sulfonamide Derivatives Hives    fever  . Glipizide Other (See Comments)    psychosis  . Sulfamethoxazole-Trimethoprim Rash    Prescriptions prior to admission  Medication Sig Dispense Refill  . albuterol (PROVENTIL HFA;VENTOLIN HFA)  108 (90 BASE) MCG/ACT inhaler Inhale 2 puffs into the lungs 3 (three) times daily as needed for wheezing or shortness of breath.  1 Inhaler  2  . esomeprazole (NEXIUM) 20 MG capsule Take 1 capsule (20 mg total) by mouth daily.  30 capsule  3  . hydrochlorothiazide (HYDRODIURIL) 25 MG tablet TAKE ONE TABLET BY MOUTH DAILY.  30 tablet  3  . Insulin Glargine (LANTUS SOLOSTAR) 100 UNIT/ML SOPN Inject 24 Units into the skin at bedtime.      . lovastatin (MEVACOR) 20 MG tablet Take 20 mg by mouth at bedtime.      . metFORMIN (GLUCOPHAGE) 1000 MG tablet Take 1 tablet (1,000 mg total) by mouth 2 (two) times daily.  60 tablet  5  . metoprolol (LOPRESSOR) 50 MG tablet Take 50 mg by mouth 2 (two) times daily.      . mometasone (NASONEX) 50 MCG/ACT nasal spray Place 4 sprays into the nose daily.  17 g  2  . RASPBERRY KETONES PO Take by mouth.      . ziprasidone (GEODON) 80 MG capsule Take 1 capsule (80 mg total) by mouth 2 (two) times daily with a meal.  60 capsule  0  . ferrous sulfate 325 (65 FE) MG tablet Take 1 tablet (325 mg total) by mouth 3 (three) times daily with meals.  90 tablet  3  . hydrocortisone (ANUSOL-HC) 2.5 % rectal cream Place rectally as needed.      Marland Kitchen ibuprofen (ADVIL,MOTRIN) 200 MG tablet Take 400 mg by mouth every 8 (eight) hours as needed. Aches and pains, fever      . NOVOFINE 32G X 6 MM MISC USE AS DIRECTED BEFORE BREAKFAST.  100 each  11  . ondansetron (ZOFRAN ODT) 8 MG disintegrating tablet Take 1 tablet (8 mg total) by mouth every 8 (eight) hours as needed for nausea.  20 tablet  0    ROS Physical Exam   Blood pressure 124/76, pulse 84, temperature 98.7 F (37.1 C), temperature source Oral, resp. rate 18, height 5\' 4"  (1.626 m), weight 94.348 kg (208 lb), last menstrual period 01/20/2013.  Physical Exam  Nursing note and vitals reviewed. Constitutional: She is oriented to person, place, and time. She appears well-developed and well-nourished. No distress.  Cardiovascular:  Normal rate.   Respiratory: Effort normal.  GI: Soft. There is no tenderness.  Genitourinary:   External: no lesion Vagina: small amount of white discharge Cervix: pink, smooth, no CMT Uterus: NSSC Adnexa: NT   Neurological: She is alert and oriented to person, place, and time.  Skin: Skin is warm and dry.  Psychiatric: She has a normal mood and affect.    MAU Course  Procedures  Results for orders placed  during the hospital encounter of 03/29/13 (from the past 24 hour(s))  URINALYSIS, ROUTINE W REFLEX MICROSCOPIC     Status: Abnormal   Collection Time    03/29/13  4:30 AM      Result Value Range   Color, Urine YELLOW  YELLOW   APPearance CLEAR  CLEAR   Specific Gravity, Urine >1.030 (*) 1.005 - 1.030   pH 6.0  5.0 - 8.0   Glucose, UA NEGATIVE  NEGATIVE mg/dL   Hgb urine dipstick NEGATIVE  NEGATIVE   Bilirubin Urine NEGATIVE  NEGATIVE   Ketones, ur 15 (*) NEGATIVE mg/dL   Protein, ur NEGATIVE  NEGATIVE mg/dL   Urobilinogen, UA 0.2  0.0 - 1.0 mg/dL   Nitrite NEGATIVE  NEGATIVE   Leukocytes, UA NEGATIVE  NEGATIVE  POCT PREGNANCY, URINE     Status: None   Collection Time    03/29/13  4:33 AM      Result Value Range   Preg Test, Ur NEGATIVE  NEGATIVE  HCG, SERUM, QUALITATIVE     Status: None   Collection Time    03/29/13  5:08 AM      Result Value Range   Preg, Serum NEGATIVE  NEGATIVE     Assessment and Plan   1. Pelvic pain    Follow-up Information   Schedule an appointment as soon as possible for a visit with Eula Listen, MD.   Specialty:  Gastroenterology   Contact information:   [address] [place] [address] [phone]      Return to MAU as needed  Tawnya Crook 03/29/2013, 5:47 AM

## 2013-03-29 NOTE — MAU Provider Note (Signed)
Attestation of Attending Supervision of Advanced Practitioner (PA/CNM/NP): Evaluation and management procedures were performed by the Advanced Practitioner under my supervision and collaboration.  I have reviewed the Advanced Practitioner's note and chart, and I agree with the management and plan.  Waneda Klammer, MD, FACOG Attending Obstetrician & Gynecologist Faculty Practice, [facility]  

## 2013-03-29 NOTE — MAU Note (Signed)
Patient complains of lower abdominal pain and back pain for the last three days. Had elevated BP at home, 145/97 this morning and 137/90 yesterday. LMP 9/7. Had a negative UPT but states her urine pregnancy test are always negative and needs a blood pregnancy test.

## 2013-03-30 LAB — GC/CHLAMYDIA PROBE AMP: GC Probe RNA: NEGATIVE

## 2013-04-02 ENCOUNTER — Ambulatory Visit (HOSPITAL_COMMUNITY): Payer: Medicare Other

## 2013-04-02 ENCOUNTER — Telehealth: Payer: Self-pay | Admitting: *Deleted

## 2013-04-02 NOTE — Telephone Encounter (Signed)
[name] called and left a message requesting a call about pap results and std results. Called [name] and informed her pap negative, GC/Chlam/wet prep all negative. [name] voices understanding.

## 2013-04-30 ENCOUNTER — Other Ambulatory Visit: Payer: Self-pay | Admitting: Internal Medicine

## 2013-05-08 ENCOUNTER — Encounter: Payer: Self-pay | Admitting: Internal Medicine

## 2013-05-12 ENCOUNTER — Encounter: Payer: Self-pay | Admitting: Internal Medicine

## 2013-05-12 ENCOUNTER — Ambulatory Visit (INDEPENDENT_AMBULATORY_CARE_PROVIDER_SITE_OTHER): Payer: Medicare Other | Admitting: Internal Medicine

## 2013-05-12 VITALS — BP 133/88 | HR 89 | Temp 98.1°F | Wt 212.2 lb

## 2013-05-12 DIAGNOSIS — J069 Acute upper respiratory infection, unspecified: Secondary | ICD-10-CM

## 2013-05-12 DIAGNOSIS — E119 Type 2 diabetes mellitus without complications: Secondary | ICD-10-CM

## 2013-05-12 DIAGNOSIS — R634 Abnormal weight loss: Secondary | ICD-10-CM | POA: Diagnosis not present

## 2013-05-12 DIAGNOSIS — N925 Other specified irregular menstruation: Secondary | ICD-10-CM | POA: Diagnosis not present

## 2013-05-12 HISTORY — DX: Acute upper respiratory infection, unspecified: J06.9

## 2013-05-12 MED ORDER — HYDROCOD POLST-CHLORPHEN POLST 10-8 MG/5ML PO LQCR: 5.0000 mL | Freq: Two times a day (BID) | ORAL | Status: DC | PRN

## 2013-05-12 MED ORDER — FREESTYLE SYSTEM KIT: 1.0000 | PACK | Status: DC | PRN

## 2013-05-12 MED ORDER — LANCET DEVICES MISC: Status: DC

## 2013-05-12 MED ORDER — GLUCOSE BLOOD VI STRP: ORAL_STRIP | Status: DC

## 2013-05-12 NOTE — Patient Instructions (Addendum)
-I have sent a glucose monitoring kit, lancets and test strips to your pharmacy  -I believe you have a viral upper respiratory tract infection.  I am providing you with cough syrup, and get lots of rest and drink plenty of fluids!  If you get worse instead of getting better, please let me know!  If you notice your blood sugars are less than 80, cut your lantus dose in half (12 units) while you are sick.  Please be sure to bring all of your medications with you to every visit.  Should you have any new or worsening symptoms, please be sure to call the clinic at [phone].  Upper Respiratory Infection, Adult An upper respiratory infection (URI) is also sometimes known as the common cold. The upper respiratory tract includes the nose, sinuses, throat, trachea, and bronchi. Bronchi are the airways leading to the lungs. Most people improve within 1 week, but symptoms can last up to 2 weeks. A residual cough may last even longer.  CAUSES Many different viruses can infect the tissues lining the upper respiratory tract. The tissues become irritated and inflamed and often become very moist. Mucus production is also common. A cold is contagious. You can easily spread the virus to others by oral contact. This includes kissing, sharing a glass, coughing, or sneezing. Touching your mouth or nose and then touching a surface, which is then touched by another person, can also spread the virus. SYMPTOMS  Symptoms typically develop 1 to 3 days after you come in contact with a cold virus. Symptoms vary from person to person. They may include:  Runny nose.  Sneezing.  Nasal congestion.  Sinus irritation.  Sore throat.  Loss of voice (laryngitis).  Cough.  Fatigue.  Muscle aches.  Loss of appetite.  Headache.  Low-grade fever. DIAGNOSIS  You might diagnose your own cold based on familiar symptoms, since most people get a cold 2 to 3 times a year. Your caregiver can confirm this based on your exam.  Most importantly, your caregiver can check that your symptoms are not due to another disease such as strep throat, sinusitis, pneumonia, asthma, or epiglottitis. Blood tests, throat tests, and X-rays are not necessary to diagnose a common cold, but they may sometimes be helpful in excluding other more serious diseases. Your caregiver will decide if any further tests are required. RISKS AND COMPLICATIONS  You may be at risk for a more severe case of the common cold if you smoke cigarettes, have chronic heart disease (such as heart failure) or lung disease (such as asthma), or if you have a weakened immune system. The very young and very old are also at risk for more serious infections. Bacterial sinusitis, middle ear infections, and bacterial pneumonia can complicate the common cold. The common cold can worsen asthma and chronic obstructive pulmonary disease (COPD). Sometimes, these complications can require emergency medical care and may be life-threatening. PREVENTION  The best way to protect against getting a cold is to practice good hygiene. Avoid oral or hand contact with people with cold symptoms. Wash your hands often if contact occurs. There is no clear evidence that vitamin C, vitamin E, echinacea, or exercise reduces the chance of developing a cold. However, it is always recommended to get plenty of rest and practice good nutrition. TREATMENT  Treatment is directed at relieving symptoms. There is no cure. Antibiotics are not effective, because the infection is caused by a virus, not by bacteria. Treatment may include:  Increased fluid intake. Sports  drinks offer valuable electrolytes, sugars, and fluids.  Breathing heated mist or steam (vaporizer or shower).  Eating chicken soup or other clear broths, and maintaining good nutrition.  Getting plenty of rest.  Using gargles or lozenges for comfort.  Controlling fevers with ibuprofen or acetaminophen as directed by your  caregiver.  Increasing usage of your inhaler if you have asthma. Zinc gel and zinc lozenges, taken in the first 24 hours of the common cold, can shorten the duration and lessen the severity of symptoms. Pain medicines may help with fever, muscle aches, and throat pain. A variety of non-prescription medicines are available to treat congestion and runny nose. Your caregiver can make recommendations and may suggest nasal or lung inhalers for other symptoms.  HOME CARE INSTRUCTIONS   Only take over-the-counter or prescription medicines for pain, discomfort, or fever as directed by your caregiver.  Use a warm mist humidifier or inhale steam from a shower to increase air moisture. This may keep secretions moist and make it easier to breathe.  Drink enough water and fluids to keep your urine clear or pale yellow.  Rest as needed.  Return to work when your temperature has returned to normal or as your caregiver advises. You may need to stay home longer to avoid infecting others. You can also use a face mask and careful hand washing to prevent spread of the virus. SEEK MEDICAL CARE IF:   After the first few days, you feel you are getting worse rather than better.  You need your caregiver's advice about medicines to control symptoms.  You develop chills, worsening shortness of breath, or brown or red sputum. These may be signs of pneumonia.  You develop yellow or brown nasal discharge or pain in the face, especially when you bend forward. These may be signs of sinusitis.  You develop a fever, swollen neck glands, pain with swallowing, or white areas in the back of your throat. These may be signs of strep throat. SEEK IMMEDIATE MEDICAL CARE IF:   You have a fever.  You develop severe or persistent headache, ear pain, sinus pain, or chest pain.  You develop wheezing, a prolonged cough, cough up blood, or have a change in your usual mucus (if you have chronic lung disease).  You develop sore  muscles or a stiff neck. Document Released: 09/26/2000 Document Revised: 06/25/2011 Document Reviewed: 08/04/2010 [facility] Patient Information 2014 [place], [place].

## 2013-05-12 NOTE — Progress Notes (Signed)
Subjective:   Patient ID: [name] female   DOB: 08/07/1965 48 y.o.   MRN: [id number]  Chief Complaint  Patient presents with  . Cough    productive at times  . Chills  . Generalized Body Aches    HPI: [name] is a 48 y.o. woman with h/o DM, HTN, pulm HTN, & COPD who presents for the above complaints.   Symptoms started Saturday, sick contacts include her daughter and mother with similar symptoms (mother getting better, daughter getting worse - told she had viral laryngitis). Patient feels better today than yesterday, coughing is not letting up. Yellow sputum producing cough. No fever but is having chills/hot cold flashes. No rhinorrhea but does feel sinus congestion with frontal HA. +Myalgias. No ear pain/discharge. Tickle in her throat, itchy. +Nausea, but no vomiting. No diarrhea, but moving bowels everytime she goes to the bathroom. +sneezing.  No SOB, no DOE.  No appetite.  Review of Systems: Constitutional: per HPI HEENT: Denies photophobia, eye pain, redness, hearing loss, ear pain, trouble swallowing, neck pain, neck stiffness and tinnitus.  Respiratory: Denies chest tightness; ?wheezing/chest whistling  Cardiovascular: Denies chest pain, palpitations and leg swelling.  Gastrointestinal: Denies vomiting, abdominal pain, diarrhea, constipation,blood in stool and abdominal distention.  Genitourinary: Denies dysuria, urgency, frequency, hematuria, flank pain and difficulty urinating. Urinated on self twice with coughing, but resolved since Friday/Sat Musculoskeletal: Denies back pain, joint swelling, arthralgias and gait problem.  Skin: Denies pallor and wound.  Neurological: Denies seizures, syncope, numbness. +dizziness/lightheadedness today  Past Medical History  Diagnosis Date  . Arteriosclerotic cardiovascular disease (ASCVD)     Minimal at cath in [facility].stress nuclear study in 8/08 with nl EF; neg stress echo in 2010  . Diabetes  mellitus, type 2 2000    Onset in 2000; no insulin  . Hyperlipidemia   . Hypertension `    during treatment with Geodon  . Gastroesophageal reflux disease     Schatzki's ring  . Anemia, iron deficiency   . Alcohol abuse   . Depression   . Community acquired pneumonia 01/03/10, 05/2010, 04/2012    2011; with pleural effusion-[facility]osp [facility] acute resp failure; intubated in Jan 2014 (HMPV pneumonia)  . Obesity   . Schizoaffective disorder     requiring multiple psychiatric admissions  . Dysphagia   . Diastolic dysfunction     grade 2 per echo 2011  . Pulmonary hypertension 05/02/2012    Patient needs repeat echo in 06/2012   . History of alcohol abuse 07/22/2007    Qualifier: Diagnosis of  By: Lenn Cal     Current Outpatient Prescriptions  Medication Sig Dispense Refill  . albuterol (PROVENTIL HFA;VENTOLIN HFA) 108 (90 BASE) MCG/ACT inhaler Inhale 2 puffs into the lungs 3 (three) times daily as needed for wheezing or shortness of breath.  1 Inhaler  2  . esomeprazole (NEXIUM) 20 MG capsule Take 1 capsule (20 mg total) by mouth daily.  30 capsule  3  . ferrous sulfate 325 (65 FE) MG tablet Take 1 tablet (325 mg total) by mouth 3 (three) times daily with meals.  90 tablet  3  . hydrochlorothiazide (HYDRODIURIL) 25 MG tablet TAKE ONE TABLET BY MOUTH DAILY.  30 tablet  3  . hydrocortisone (ANUSOL-HC) 2.5 % rectal cream Place rectally as needed.      Marland Kitchen ibuprofen (ADVIL,MOTRIN) 200 MG tablet Take 400 mg by mouth every 8 (eight) hours as needed. Aches and pains, fever      .  Insulin Glargine (LANTUS SOLOSTAR) 100 UNIT/ML SOPN Inject 24 Units into the skin at bedtime.      . lovastatin (MEVACOR) 20 MG tablet Take 20 mg by mouth at bedtime.      . metFORMIN (GLUCOPHAGE) 1000 MG tablet TAKE (1) TABLET BY MOUTH TWICE DAILY WITH MEALS.  60 tablet  11  . metoprolol (LOPRESSOR) 50 MG tablet TAKE 1 TABLET BY MOUTH TWICE DAILY.  60 tablet  11  . mometasone (NASONEX) 50 MCG/ACT nasal spray Place 4  sprays into the nose daily.  17 g  2  . NOVOFINE 32G X 6 MM MISC USE AS DIRECTED BEFORE BREAKFAST.  100 each  11  . ondansetron (ZOFRAN ODT) 8 MG disintegrating tablet Take 1 tablet (8 mg total) by mouth every 8 (eight) hours as needed for nausea.  20 tablet  0  . RASPBERRY KETONES PO Take by mouth.      . ziprasidone (GEODON) 80 MG capsule Take 1 capsule (80 mg total) by mouth 2 (two) times daily with a meal.  60 capsule  0   No current facility-administered medications for this visit.   Family History  Problem Relation Age of Onset  . Colon cancer Other   . Hypertension Mother   . Stroke Father     deceased at age 24  . Heart disease Sister   . Anesthesia problems Neg Hx   . Hypotension Neg Hx   . Malignant hyperthermia Neg Hx   . Pseudochol deficiency Neg Hx    History   Social History  . Marital Status: Single    Spouse Name: N/A    Number of Children: N/A  . Years of Education: 12   Occupational History  . Disability   . UNEMPLOYED    Social History Main Topics  . Smoking status: Current Every Day Smoker -- 1.00 packs/day for 30 years    Types: Cigarettes    Start date: 05/18/2012  . Smokeless tobacco: Never Used     Comment: pulmonary dr gave something to try to inhale  . Alcohol Use: 0.0 oz/week     Comment: every now and then  . Drug Use: No  . Sexual Activity: Yes    Partners: Male    Birth Control/ Protection: None, Inserts   Other Topics Concern  . 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, has interviewed for job again (08/16/11); On disability (depression qualifies); Graduated high school in [place] and some community college in [place]    Objective:  Physical Exam: Filed Vitals:   05/12/13 1027  BP: 132/80  Pulse: 81  Weight: 212 lb 3.2 oz (96.253 kg)  SpO2: 100%   General: pleasant, appears mild-mod ill, no acute distress HEENT: PERRL, EOMI, no scleral icterus,  b/l inflamed nasal turbinates, b/l frontal sinus tenderness and left maxillary sinus tenderness, no cervical LAD, no pharyngeal erythema/exudate (but difficult to visualized), +dry tongue Cardiac: RRR, no rubs, murmurs or gallops Pulm: clear to auscultation bilaterally, moving normal volumes of air Abd: soft, nontender, nondistended, BS normoactive  Ext: warm and well perfused, no pedal edema Neuro: alert and oriented X3, cranial nerves II-XII grossly intact  Assessment & Plan:  Case and care discussed with Dr. Daryll Drown.  Please see problem oriented charting for further details. Patient to return in ~1 month for routine visit.

## 2013-05-12 NOTE — Assessment & Plan Note (Signed)
Refilled glucometer/lancets/strips. If CBG<80, cut lantus dose in half (12u) while ill. Return in ~1 month for routine visit.

## 2013-05-12 NOTE — Assessment & Plan Note (Signed)
Likely viral sinusitis vs laryngitis.  Will provide tussionex for symptomatic relief of cough.  Rec hydration and rest. RTC if sx no better in 1 week.

## 2013-05-14 NOTE — Progress Notes (Signed)
Case discussed with Dr. Sharda at the time of the visit.  We reviewed the resident's history and exam and pertinent patient test results.  I agree with the assessment, diagnosis, and plan of care documented in the resident's note.    

## 2013-05-18 ENCOUNTER — Telehealth: Payer: Self-pay | Admitting: *Deleted

## 2013-05-18 NOTE — Telephone Encounter (Signed)
Pt called was concerned about CBG before eating - ranging from 80 - 109. Check CBG after a meal 141. A1C in 02/2013 7.5. Takes Lantus at night and checks CBG before each meal. Reassured. Instructed pt to call if CBG are below 70. Left message for [name] Plyler to call pt. Offered to make appt in clinic sooner. Hilda Blades Dellar Traber RN 05/18/13 11:15AM

## 2013-05-19 ENCOUNTER — Telehealth: Payer: Self-pay | Admitting: Dietician

## 2013-05-19 NOTE — Telephone Encounter (Signed)
Asked by triage nurse to call pt: she is still not feeling well, not used to lower blood sugars. Took 12 units Lantus instead 24units for a few days now. CBG this am was 100 mg/dL. Yesterday: 83, 140- after eating,181 before bed last night.  Assured her that as long as her blood sugars are between 80 and 250 (after meals) that she is maintaining good control. Advised she may have to increase her lantus back if CBGs rise >250 mg/dl. If not she can discuss her insulin and CBGs at 06/02/13 appointment.

## 2013-05-24 ENCOUNTER — Other Ambulatory Visit: Payer: Self-pay | Admitting: Internal Medicine

## 2013-06-04 ENCOUNTER — Ambulatory Visit: Payer: Self-pay | Admitting: Obstetrics and Gynecology

## 2013-06-10 ENCOUNTER — Encounter: Payer: Self-pay | Admitting: Obstetrics & Gynecology

## 2013-06-10 ENCOUNTER — Ambulatory Visit (INDEPENDENT_AMBULATORY_CARE_PROVIDER_SITE_OTHER): Payer: Medicare Other | Admitting: Obstetrics & Gynecology

## 2013-06-10 VITALS — BP 120/87 | HR 94 | Temp 97.5°F | Ht 64.0 in | Wt 211.3 lb

## 2013-06-10 DIAGNOSIS — B373 Candidiasis of vulva and vagina: Secondary | ICD-10-CM

## 2013-06-10 DIAGNOSIS — R5383 Other fatigue: Secondary | ICD-10-CM

## 2013-06-10 DIAGNOSIS — E119 Type 2 diabetes mellitus without complications: Secondary | ICD-10-CM | POA: Diagnosis not present

## 2013-06-10 DIAGNOSIS — Z1231 Encounter for screening mammogram for malignant neoplasm of breast: Secondary | ICD-10-CM

## 2013-06-10 DIAGNOSIS — R5381 Other malaise: Secondary | ICD-10-CM | POA: Diagnosis not present

## 2013-06-10 DIAGNOSIS — Z113 Encounter for screening for infections with a predominantly sexual mode of transmission: Secondary | ICD-10-CM | POA: Diagnosis not present

## 2013-06-10 DIAGNOSIS — B3731 Acute candidiasis of vulva and vagina: Secondary | ICD-10-CM | POA: Diagnosis not present

## 2013-06-10 LAB — CBC
HEMATOCRIT: 37.2 % (ref 36.0–46.0)
Hemoglobin: 11.9 g/dL — ABNORMAL LOW (ref 12.0–15.0)
MCH: 25.2 pg — ABNORMAL LOW (ref 26.0–34.0)
MCHC: 32 g/dL (ref 30.0–36.0)
MCV: 78.8 fL (ref 78.0–100.0)
Platelets: 254 10*3/uL (ref 150–400)
RBC: 4.72 MIL/uL (ref 3.87–5.11)
RDW: 16.5 % — AB (ref 11.5–15.5)
WBC: 8.8 10*3/uL (ref 4.0–10.5)

## 2013-06-10 LAB — HEMOGLOBIN A1C
Hgb A1c MFr Bld: 8.2 % — ABNORMAL HIGH (ref <5.7)
Mean Plasma Glucose: 189 mg/dL — ABNORMAL HIGH (ref <117)

## 2013-06-10 NOTE — Patient Instructions (Signed)
Thank you for enrolling in [place]. Please follow the instructions below to securely access your online medical record. MyChart allows you to send messages to your doctor, view your test results, manage appointments, and more.   How Do I Sign Up? 1. In your Internet browser, go to [employer] and enter [email or web address]. 2. Click on the Sign Up Now link in the Sign In box. You will see the New Member Sign Up page. 3. Enter your MyChart Access Code exactly as it appears below. You will not need to use this code after you've completed the sign-up process. If you do not sign up before the expiration date, you must request a new code.  MyChart Access Code: [email or web address] Expires: 07/11/2013 11:09 AM  4. Enter your Social Security Number ([id number]) and Date of Birth (mm/dd/yyyy) as indicated and click Submit. You will be taken to the next sign-up page. 5. Create a MyChart ID. This will be your MyChart login ID and cannot be changed, so think of one that is secure and easy to remember. 6. Create a MyChart password. You can change your password at any time. 7. Enter your Password Reset Question and Answer. This can be used at a later time if you forget your password.  8. Enter your e-mail address. You will receive e-mail notification when new information is available in [place]. 9. Click Sign Up. You can now view your medical record.   Additional Information Remember, MyChart is NOT to be used for urgent needs. For medical emergencies, dial 911.

## 2013-06-10 NOTE — Progress Notes (Signed)
   CLINIC ENCOUNTER NOTE  History:  48 y.o. [id number] with Type II DM here today for evaluation of pruritic vaginal discharge. Also wants STI screening, had negative GC/Chlam in 03/2013 and does not want this test repeated. Also wants CBC and electrolytes checked because "I don't feel like everything is okay".  No specific symptoms except for fatigue, had normal TSH in 01/23/2013.  The following portions of the patient's history were reviewed and updated as appropriate: allergies, current medications, past family history, past medical history, past social history, past surgical history and problem list. Normal pap, negative HPV 01/16/13.  Last mammogram in 04/13/11 was normal.   Review of Systems:  Pertinent items are noted in HPI.  Objective:  Physical Exam BP 120/87  Pulse 94  Temp(Src) 97.5 F (36.4 C) (Oral)  Ht 5\' 4"  (1.626 m)  Wt 211 lb 4.8 oz (95.845 kg)  BMI 36.25 kg/m2  LMP 04/30/2013 Gen: NAD Abd: Soft, nontender and nondistended Pelvic: Normal appearing external genitalia; normal appearing vaginal mucosa and cervix.  White discharge, wet prep obtained.  Small uterus, no other palpable masses, no uterine or adnexal tenderness  Assessment & Plan:  Will follow up results of wet prep and STI lab testing and manage accordingly.  CBC, CMET, HgA1C also tested. Follow up with PCP for other heath concerns. Routine preventative health maintenance measures emphasized, mammogram scheduled.    Verita Schneiders, MD, [place] Attending [place], [place]

## 2013-06-11 ENCOUNTER — Telehealth: Payer: Self-pay

## 2013-06-11 LAB — COMPREHENSIVE METABOLIC PANEL
ALBUMIN: 4.2 g/dL (ref 3.5–5.2)
ALT: 8 U/L (ref 0–35)
AST: 10 U/L (ref 0–37)
Alkaline Phosphatase: 69 U/L (ref 39–117)
BUN: 7 mg/dL (ref 6–23)
CO2: 23 mEq/L (ref 19–32)
Calcium: 9.1 mg/dL (ref 8.4–10.5)
Chloride: 100 mEq/L (ref 96–112)
Creat: 0.82 mg/dL (ref 0.50–1.10)
Glucose, Bld: 157 mg/dL — ABNORMAL HIGH (ref 70–99)
POTASSIUM: 3.6 meq/L (ref 3.5–5.3)
SODIUM: 133 meq/L — AB (ref 135–145)
TOTAL PROTEIN: 6.5 g/dL (ref 6.0–8.3)
Total Bilirubin: 0.2 mg/dL (ref 0.2–1.2)

## 2013-06-11 LAB — WET PREP, GENITAL
Clue Cells Wet Prep HPF POC: NONE SEEN
TRICH WET PREP: NONE SEEN

## 2013-06-11 LAB — HIV ANTIBODY (ROUTINE TESTING W REFLEX): HIV: NONREACTIVE

## 2013-06-11 LAB — HEPATITIS C ANTIBODY: HCV Ab: NEGATIVE

## 2013-06-11 LAB — RPR

## 2013-06-11 LAB — HEPATITIS B SURFACE ANTIGEN: Hepatitis B Surface Ag: NEGATIVE

## 2013-06-11 MED ORDER — FLUCONAZOLE 150 MG PO TABS: 150.0000 mg | ORAL_TABLET | Freq: Once | ORAL | Status: DC

## 2013-06-11 NOTE — Addendum Note (Signed)
Addended by: Verita Schneiders A on: 06/11/2013 08:43 AM   Modules accepted: Orders

## 2013-06-11 NOTE — Telephone Encounter (Signed)
Called pt. And informed her of yeast infection and prescription at her pharmacy. Pt. Asked about her other lab work as well. Informed her of negative results. Pt. Verbalizes understanding and gratitude and had no other questions or concerns.

## 2013-06-11 NOTE — Telephone Encounter (Signed)
Message copied by Geanie Logan on Thu Jun 11, 2013  9:53 AM ------      Message from: Verita Schneiders A      Created: Thu Jun 11, 2013  8:44 AM       Wet prep showed yeast, Diflucan prescribed.  Please call to inform patient of results and recommendations.       ------

## 2013-06-12 ENCOUNTER — Ambulatory Visit: Payer: Self-pay | Admitting: Internal Medicine

## 2013-06-12 ENCOUNTER — Encounter: Payer: Self-pay | Admitting: Internal Medicine

## 2013-06-16 ENCOUNTER — Ambulatory Visit (HOSPITAL_COMMUNITY): Payer: Medicare Other | Attending: Obstetrics & Gynecology

## 2013-06-23 ENCOUNTER — Ambulatory Visit (HOSPITAL_COMMUNITY): Payer: Medicare Other

## 2013-06-23 DIAGNOSIS — F319 Bipolar disorder, unspecified: Secondary | ICD-10-CM | POA: Diagnosis not present

## 2013-07-02 ENCOUNTER — Ambulatory Visit: Payer: Self-pay | Admitting: Gastroenterology

## 2013-07-09 ENCOUNTER — Encounter: Payer: Self-pay | Admitting: Internal Medicine

## 2013-07-09 ENCOUNTER — Ambulatory Visit (INDEPENDENT_AMBULATORY_CARE_PROVIDER_SITE_OTHER): Payer: Medicare Other | Admitting: Internal Medicine

## 2013-07-09 VITALS — BP 126/79 | HR 73 | Temp 99.0°F | Ht 64.0 in | Wt 208.4 lb

## 2013-07-09 DIAGNOSIS — N938 Other specified abnormal uterine and vaginal bleeding: Secondary | ICD-10-CM | POA: Diagnosis not present

## 2013-07-09 DIAGNOSIS — E119 Type 2 diabetes mellitus without complications: Secondary | ICD-10-CM

## 2013-07-09 DIAGNOSIS — I10 Essential (primary) hypertension: Secondary | ICD-10-CM

## 2013-07-09 DIAGNOSIS — N925 Other specified irregular menstruation: Secondary | ICD-10-CM | POA: Diagnosis not present

## 2013-07-09 DIAGNOSIS — F17200 Nicotine dependence, unspecified, uncomplicated: Secondary | ICD-10-CM

## 2013-07-09 DIAGNOSIS — R634 Abnormal weight loss: Secondary | ICD-10-CM | POA: Diagnosis not present

## 2013-07-09 DIAGNOSIS — J069 Acute upper respiratory infection, unspecified: Secondary | ICD-10-CM | POA: Diagnosis not present

## 2013-07-09 LAB — GLUCOSE, CAPILLARY: Glucose-Capillary: 193 mg/dL — ABNORMAL HIGH (ref 70–99)

## 2013-07-09 MED ORDER — HYDROCHLOROTHIAZIDE 12.5 MG PO TABS: 12.5000 mg | ORAL_TABLET | Freq: Every day | ORAL | Status: DC

## 2013-07-09 NOTE — Patient Instructions (Signed)
GREAT JOB cutting back smoking.  Keep up the good work and try to get to [employer]  I think it would be ok to continue with the Tea if that is helping to stop smoking.  Please reduce Hydrochlorothiazide to 12.5mg  a day (half of your old 25mg  pill)  Please bring your medicines with you each time you come.   Medicines may be  Eye drops  Herbal   Vitamins  Pills  Seeing these help [name] take care of you.

## 2013-07-09 NOTE — Assessment & Plan Note (Signed)
Commended progress toward cessation. Advised to continue Detox tea if it continues to help her to goal of smoking cessation.

## 2013-07-09 NOTE — Assessment & Plan Note (Signed)
-  Continue Insulin at current dose. -Follow up in 2 months.

## 2013-07-09 NOTE — Assessment & Plan Note (Signed)
BP well controlled today at 111/64 given her smoking reduction and healthy diet her dizziness may reflect orthostatic hypotention although orthostatic vital signs are negative today. -Reduce HCTZ to 12.5mg  daily - Follow up in 2 months.

## 2013-07-09 NOTE — Progress Notes (Signed)
[facility] Subjective:   Patient ID: [name] female   DOB: 1965/08/29 48 y.o.   MRN: [id number]  HPI: [name] is a 48 y.o. female with a PMH significant for Schizoaffective disorder, Bipolar 1 disorder. HTN, T2DM, dCHF.  She reports she has been taking Yogi Detox cleansing tea three times a day to help quit smoking. She started this 11 days ago and then started having some dizziness and increased blood pressure readings.  She stopped on day 9 of her 10 day program.  Of note her BP readings at home were 129/94 (highest).  When she gets dizzy she reports she feels like she is going to pass out and she needs to sit down and take it easy.  She does note that she has drinking more water, and eating less salt. The cleanse has helped her quit smoking. She is not yet smoke free, she has cut down from 1-2 ppd to 1/3 ppd. Of note her BP here was   111/64 and her meter read 134/82 at the same time. She reports compliance with her metformin and Lantus (although sometimes takes less than 18 units), she denies any hypoglycemic episodes and reports AM sugars from 120s-160s.  Past Medical History  Diagnosis Date  . Arteriosclerotic cardiovascular disease (ASCVD)     Minimal at cath in [facility].stress nuclear study in 8/08 with nl EF; neg stress echo in 2010  . Diabetes mellitus, type 2 2000    Onset in 2000; no insulin  . Hyperlipidemia   . Hypertension `    during treatment with Geodon  . Gastroesophageal reflux disease     Schatzki's ring  . Anemia, iron deficiency   . Alcohol abuse   . Depression   . Community acquired pneumonia 01/03/10, 05/2010, 04/2012    2011; with pleural effusion-[facility] acute resp failure; intubated in Jan 2014 (HMPV pneumonia)  . Obesity   . Schizoaffective disorder     requiring multiple psychiatric admissions  . Dysphagia   . Diastolic dysfunction     grade 2 per echo 2011  . Pulmonary hypertension 05/02/2012     Patient needs repeat echo in 06/2012   . History of alcohol abuse 07/22/2007    Qualifier: Diagnosis of  By: Lenn Cal     Current Outpatient Prescriptions  Medication Sig Dispense Refill  . albuterol (PROVENTIL HFA;VENTOLIN HFA) 108 (90 BASE) MCG/ACT inhaler Inhale 2 puffs into the lungs 3 (three) times daily as needed for wheezing or shortness of breath.  1 Inhaler  2  . Ascorbic Acid (VITAMIN C) 1000 MG tablet Take 1,000 mg by mouth daily.      . fluconazole (DIFLUCAN) 150 MG tablet Take 1 tablet (150 mg total) by mouth once.  1 tablet  3  . glucose blood (ACCU-CHEK ACTIVE STRIPS) test strip Use as instructed  100 each  12  . glucose monitoring kit (FREESTYLE) monitoring kit 1 each by Does not apply route as needed for other.  1 each  0  . hydrochlorothiazide (HYDRODIURIL) 25 MG tablet TAKE ONE TABLET BY MOUTH DAILY.  30 tablet  3  . ibuprofen (ADVIL,MOTRIN) 200 MG tablet Take 400 mg by mouth every 8 (eight) hours as needed. Aches and pains, fever      . Insulin Glargine (LANTUS SOLOSTAR) 100 UNIT/ML SOPN Inject 18 Units into the skin at bedtime.       Elmore Guise Devices MISC Check CBGs 2-3 times daily  100  each  11  . lovastatin (MEVACOR) 20 MG tablet Take 20 mg by mouth at bedtime.      . metFORMIN (GLUCOPHAGE) 1000 MG tablet TAKE (1) TABLET BY MOUTH TWICE DAILY WITH MEALS.  60 tablet  11  . metoprolol (LOPRESSOR) 50 MG tablet TAKE 1 TABLET BY MOUTH TWICE DAILY.  60 tablet  11  . mometasone (NASONEX) 50 MCG/ACT nasal spray Place 4 sprays into the nose daily.  17 g  2  . NEXIUM 20 MG capsule TAKE ONE CAPSULE BY MOUTH DAILY.  30 capsule  3  . ondansetron (ZOFRAN ODT) 8 MG disintegrating tablet Take 1 tablet (8 mg total) by mouth every 8 (eight) hours as needed for nausea.  20 tablet  0  . ziprasidone (GEODON) 80 MG capsule Take 1 capsule (80 mg total) by mouth 2 (two) times daily with a meal.  60 capsule  0   No current facility-administered medications for this visit.   Family History   Problem Relation Age of Onset  . Colon cancer Other   . Hypertension Mother   . Stroke Father     deceased at age 36  . Heart disease Sister   . Anesthesia problems Neg Hx   . Hypotension Neg Hx   . Malignant hyperthermia Neg Hx   . Pseudochol deficiency Neg Hx    History   Social History  . Marital Status: Single    Spouse Name: N/A    Number of Children: N/A  . Years of Education: 12   Occupational History  . Disability   . UNEMPLOYED    Social History Main Topics  . Smoking status: Light Tobacco Smoker -- 0.20 packs/day for 30 years    Types: Cigarettes    Start date: 05/18/2012  . Smokeless tobacco: Never Used     Comment: Has almostquit totally.  . Alcohol Use: No  . Drug Use: No  . Sexual Activity: None   Other Topics Concern  . 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 Negative for fever, chills and malaise/fatigue.  Eyes: Negative for blurred vision.  Cardiovascular: Negative for chest pain and leg swelling.  Gastrointestinal: Negative for abdominal pain.  Genitourinary: Negative for dysuria and frequency.  Neurological: Negative for dizziness and headaches.    Objective:  Physical Exam: Filed Vitals:   07/09/13 1543  BP: 111/64  Pulse: 81  Temp: 99 F (37.2 C)  TempSrc: Oral  Height: 5' 4" (1.626 m)  Weight: 208 lb 6.4 oz (94.53 kg)  SpO2: 99%  Physical Exam  Nursing note and vitals reviewed. Constitutional: She is well-developed, well-nourished, and in no distress.  HENT:  Head: Normocephalic and atraumatic.  Cardiovascular: Normal rate, regular rhythm, normal heart sounds and intact distal pulses.    Pulmonary/Chest: Effort normal and breath sounds normal.  Musculoskeletal: She exhibits no edema.   Assessment & Plan:  Case discussed with Dr. Eppie Gibson See Problem Based Assessment and Plan Medications Ordered Meds ordered this encounter  Medications  . NOVOFINE 32G X 6 MM MISC    Sig:   . hydrochlorothiazide (HYDRODIURIL) 12.5 MG tablet    Sig: Take 1 tablet (12.5 mg  total) by mouth daily.    Dispense:  30 tablet    Refill:  3   Other Orders Orders Placed This Encounter  Procedures  . Glucose, capillary      

## 2013-07-10 ENCOUNTER — Telehealth: Payer: Self-pay | Admitting: *Deleted

## 2013-07-10 NOTE — Telephone Encounter (Signed)
Pt calls and states she continues to have dizziness and she has been having h/a's upon rising in the am but they go away on their own. She states the pharmacist told her that the metoprolol needs to be cut. Please advise

## 2013-07-10 NOTE — Telephone Encounter (Signed)
Called and spoke with patient. Informed that I think her dizziness is less likely due to metoprolol given her heart rate. Instructed she could stop HCTZ completely for now and see how she does.  I instructed her to check her BP daily, and to call if she has BP >150/90 for multiple days.

## 2013-07-13 NOTE — Progress Notes (Signed)
Case discussed with Dr. Hoffman at the time of the visit.  We reviewed the resident's history and exam and pertinent patient test results.  I agree with the assessment, diagnosis and plan of care documented in the resident's note. 

## 2013-07-14 ENCOUNTER — Ambulatory Visit: Payer: Self-pay | Admitting: Gastroenterology

## 2013-07-21 ENCOUNTER — Telehealth: Payer: Self-pay | Admitting: *Deleted

## 2013-07-21 ENCOUNTER — Emergency Department (HOSPITAL_COMMUNITY): Payer: Medicare Other

## 2013-07-21 ENCOUNTER — Encounter (HOSPITAL_COMMUNITY): Payer: Self-pay | Admitting: Emergency Medicine

## 2013-07-21 DIAGNOSIS — Z880 Allergy status to penicillin: Secondary | ICD-10-CM | POA: Diagnosis not present

## 2013-07-21 DIAGNOSIS — I959 Hypotension, unspecified: Secondary | ICD-10-CM | POA: Diagnosis not present

## 2013-07-21 DIAGNOSIS — F17200 Nicotine dependence, unspecified, uncomplicated: Secondary | ICD-10-CM | POA: Diagnosis not present

## 2013-07-21 DIAGNOSIS — I2789 Other specified pulmonary heart diseases: Secondary | ICD-10-CM | POA: Insufficient documentation

## 2013-07-21 DIAGNOSIS — IMO0002 Reserved for concepts with insufficient information to code with codable children: Secondary | ICD-10-CM | POA: Diagnosis not present

## 2013-07-21 DIAGNOSIS — Z8701 Personal history of pneumonia (recurrent): Secondary | ICD-10-CM | POA: Insufficient documentation

## 2013-07-21 DIAGNOSIS — F259 Schizoaffective disorder, unspecified: Secondary | ICD-10-CM | POA: Diagnosis not present

## 2013-07-21 DIAGNOSIS — R0789 Other chest pain: Secondary | ICD-10-CM | POA: Diagnosis not present

## 2013-07-21 DIAGNOSIS — Z79899 Other long term (current) drug therapy: Secondary | ICD-10-CM | POA: Insufficient documentation

## 2013-07-21 DIAGNOSIS — Z794 Long term (current) use of insulin: Secondary | ICD-10-CM | POA: Diagnosis not present

## 2013-07-21 DIAGNOSIS — E785 Hyperlipidemia, unspecified: Secondary | ICD-10-CM | POA: Diagnosis not present

## 2013-07-21 DIAGNOSIS — R609 Edema, unspecified: Secondary | ICD-10-CM | POA: Insufficient documentation

## 2013-07-21 DIAGNOSIS — E669 Obesity, unspecified: Secondary | ICD-10-CM | POA: Diagnosis not present

## 2013-07-21 DIAGNOSIS — Z862 Personal history of diseases of the blood and blood-forming organs and certain disorders involving the immune mechanism: Secondary | ICD-10-CM | POA: Insufficient documentation

## 2013-07-21 DIAGNOSIS — I10 Essential (primary) hypertension: Secondary | ICD-10-CM | POA: Insufficient documentation

## 2013-07-21 DIAGNOSIS — R0602 Shortness of breath: Secondary | ICD-10-CM | POA: Diagnosis not present

## 2013-07-21 DIAGNOSIS — K219 Gastro-esophageal reflux disease without esophagitis: Secondary | ICD-10-CM | POA: Diagnosis not present

## 2013-07-21 DIAGNOSIS — E119 Type 2 diabetes mellitus without complications: Secondary | ICD-10-CM | POA: Insufficient documentation

## 2013-07-21 NOTE — ED Notes (Signed)
Pt reports being taken off of diuretic 2 days ago.  Reports that she has gained 11 lbs in past two days.  Reporting she is presently having some edema in feet and some SOB all day.

## 2013-07-21 NOTE — Telephone Encounter (Signed)
Pt calls and states has been off insulin for appr 1 week, taking metformin twice daily 1000mg  each time, cbg's are lower, this am fasting cbg was 96. BP is good but her feet are swelling, yesterday feet were horribly swollen, this am feet were much better. She just wanted to inform you of these things. Ph# [phone]

## 2013-07-21 NOTE — ED Provider Notes (Signed)
CSN: [id number]     Arrival date & time 07/21/13  2248 History  This chart was scribed for Teressa Lower, MD by Maree Erie, ED Scribe. The patient was seen in room Room/bed info not found. Patient's care was started at 11:51 PM.     Chief Complaint  Patient presents with  . Shortness of Breath      Patient is a 48 y.o. female presenting with shortness of breath. The history is provided by the patient. No language interpreter was used.  Shortness of Breath Onset quality:  Gradual Duration:  1 day Timing:  Constant Progression:  Worsening Chronicity:  New Context comment:  Medication change Relieved by:  Nothing Ineffective treatments:  Diuretics   HPI Comments: [name] is a 48 y.o. female who presents to the Emergency Department complaining of shortness of breath for the past day. She states that she was taken off of HCTZ two days ago by her PCP due to hypotensive episodes. She reports associated chest tightness and ankle swelling. She has gained eleven pounds in two days. She took a 12.5 mg tab of HCTZ today without relief.  She denies ever being diagnosed with CHF. She reports a past medical history of DM, HTN, GERD and hypercholesteremia. She lives with her mom currently. She goes to a [name] in [place]     Past Medical History  Diagnosis Date  . Arteriosclerotic cardiovascular disease (ASCVD)     Minimal at cath in [facility].stress nuclear study in 8/08 with nl EF; neg stress echo in 2010  . Diabetes mellitus, type 2 2000    Onset in 2000; no insulin  . Hyperlipidemia   . Hypertension `    during treatment with Geodon  . Gastroesophageal reflux disease     Schatzki's ring  . Anemia, iron deficiency   . Alcohol abuse   . Depression   . Community acquired pneumonia 01/03/10, 05/2010, 04/2012    2011; with pleural effusion-[facility] acute resp failure; intubated in Jan 2014 (HMPV pneumonia)  . Obesity   .  Schizoaffective disorder     requiring multiple psychiatric admissions  . Dysphagia   . Diastolic dysfunction     grade 2 per echo 2011  . Pulmonary hypertension 05/02/2012    Patient needs repeat echo in 06/2012   . History of alcohol abuse 07/22/2007    Qualifier: Diagnosis of  By: Lenn Cal     Past Surgical History  Procedure Laterality Date  . Dilation and curettage, diagnostic / therapeutic  1992  . Esophagogastroduodenoscopy  09/16/08    Dr. Trevor Iha hiatal hernia/excoriations involving the cardia and mucosa consistent with trauma, antral erosions  of linear petechiae ? gastritis versus early gastric antral vascular  ectasia.Marland Kitchen biopsy showed reactive gastropathy. No H. pylori.  . Esophagogastroduodenoscopy  09/2007    Dr. Evalee Mutton ring, dilated to 85 French Maloney dilator, small hiatal hernia, antral erosions, biopsies reactive gastropathy.  Azzie Almas dilation  07/17/2011    [facility] sedation-->distal esophageal stricture s/p dilation, chronic gastritis, multiple ulcers in stomach. no h.pylori  . Colonoscopy  01/2006    internal hemorrhoids  . Colonoscopy  01/10/2012    Procedure: COLONOSCOPY;  Surgeon: Daneil Dolin, MD;  Location: [facility];  Service: Endoscopy;  Laterality: N/A;  entered cecum @ [phone] ; total cecal withdrawal time = 8 minutes   Family History  Problem Relation Age of Onset  . Colon cancer Other   . Hypertension Mother   .  Stroke Father     deceased at age 27  . Heart disease Sister   . Anesthesia problems Neg Hx   . Hypotension Neg Hx   . Malignant hyperthermia Neg Hx   . Pseudochol deficiency Neg Hx    History  Substance Use Topics  . Smoking status: Heavy Tobacco Smoker -- 2.00 packs/day for 30 years    Types: Cigarettes    Start date: 05/18/2012  . Smokeless tobacco: Never Used     Comment: Has almostquit totally.  . Alcohol Use: No   OB History   Grav Para Term Preterm Abortions TAB SAB Ect Mult Living   _0 Review  of Systems  Respiratory: Positive for chest tightness and shortness of breath.   Cardiovascular: Positive for leg swelling.  All other systems reviewed and are negative.      Allergies  Metronidazole; Orange; Shrimp; Penicillins; Sulfonamide derivatives; Glipizide; and Sulfamethoxazole-trimethoprim  Home Medications   Current Outpatient Rx  Name  Route  Sig  Dispense  Refill  . albuterol (PROVENTIL HFA;VENTOLIN HFA) 108 (90 BASE) MCG/ACT inhaler   Inhalation   Inhale 2 puffs into the lungs 3 (three) times daily as needed for wheezing or shortness of breath.   1 Inhaler   2   . Ascorbic Acid (VITAMIN C) 1000 MG tablet   Oral   Take 1,000 mg by mouth daily.         Marland Kitchen glucose blood (ACCU-CHEK ACTIVE STRIPS) test strip      Use as instructed   100 each   12   . glucose monitoring kit (FREESTYLE) monitoring kit   Does not apply   1 each by Does not apply route as needed for other.   1 each   0   . hydrochlorothiazide (HYDRODIURIL) 12.5 MG tablet   Oral   Take 1 tablet (12.5 mg total) by mouth daily.   30 tablet   3   . ibuprofen (ADVIL,MOTRIN) 200 MG tablet   Oral   Take 400 mg by mouth every 8 (eight) hours as needed. Aches and pains, fever         . Insulin Glargine (LANTUS SOLOSTAR) 100 UNIT/ML SOPN   Subcutaneous   Inject 18 Units into the skin at bedtime.          Elmore Guise Devices MISC      Check CBGs 2-3 times daily   100 each   11   . lovastatin (MEVACOR) 20 MG tablet   Oral   Take 20 mg by mouth at bedtime.         . metFORMIN (GLUCOPHAGE) 1000 MG tablet      TAKE (1) TABLET BY MOUTH TWICE DAILY WITH MEALS.   60 tablet   11   . metoprolol (LOPRESSOR) 50 MG tablet      TAKE 1 TABLET BY MOUTH TWICE DAILY.   60 tablet   11   . mometasone (NASONEX) 50 MCG/ACT nasal spray   Nasal   Place 4 sprays into the nose daily.   17 g   2   . NEXIUM 20 MG capsule      TAKE ONE CAPSULE BY MOUTH DAILY.   30 capsule   3   . NOVOFINE 32G X  6 MM MISC               . ondansetron (ZOFRAN ODT) 8 MG disintegrating tablet  Oral   Take 1 tablet (8 mg total) by mouth every 8 (eight) hours as needed for nausea.   20 tablet   0   . ziprasidone (GEODON) 80 MG capsule   Oral   Take 1 capsule (80 mg total) by mouth 2 (two) times daily with a meal.   60 capsule   0    BP 147/65  Pulse 76  Temp(Src) 98.8 F (37.1 C) (Oral)  Resp 21  Ht _0  (1.626 m)  Wt 219 lb (99.338 kg)  BMI 37.57 kg/m2  SpO2 99%  LMP 07/20/2013 Physical Exam  Nursing note and vitals reviewed. Constitutional: She is oriented to person, place, and time. She appears well-developed and well-nourished.  HENT:  Mouth/Throat: Oropharynx is clear and moist and mucous membranes are normal.  Eyes: EOM are normal.  Neck: Neck supple.  Cardiovascular: Normal rate, regular rhythm and normal heart sounds.   Pulmonary/Chest: Effort normal and breath sounds normal. No respiratory distress. She has no wheezes. She has no rales.  Musculoskeletal: She exhibits edema. She exhibits no tenderness.  1+ pitting edema. No calf tenderness.   Neurological: She is alert and oriented to person, place, and time.  Skin: Skin is warm and dry.  Psychiatric: She has a normal mood and affect.    ED Course  Procedures (including critical care time)  DIAGNOSTIC STUDIES: Oxygen Saturation is 99% on room air, normal by my interpretation.    COORDINATION OF CARE: 11:53 PM -will order chest x-ray. Patient verbalizes understanding and agrees with treatment plan.  Labs Review Labs Reviewed  CBC - Abnormal; Notable for the following:    RBC 3.70 (*)    Hemoglobin 10.0 (*)    HCT 30.0 (*)    RDW 17.5 (*)    All other components within normal limits  COMPREHENSIVE METABOLIC PANEL - Abnormal; Notable for the following:    Glucose, Bld 169 (*)    Albumin 3.2 (*)    Total Bilirubin <0.2 (*)    GFR calc non Af Amer 69 (*)    GFR calc Af Amer 80 (*)    All other components  within normal limits  PRO B NATRIURETIC PEPTIDE - Abnormal; Notable for the following:    Pro B Natriuretic peptide (BNP) 565.2 (*)    All other components within normal limits  I-STAT CHEM 8, ED - Abnormal; Notable for the following:    Glucose, Bld 156 (*)    Hemoglobin 11.2 (*)    HCT 33.0 (*)    All other components within normal limits  I-STAT TROPOININ, ED   Imaging Review Dg Chest 2 View  07/22/2013   CLINICAL DATA:  SHORTNESS OF BREATH, recent rapid weight gain.  EXAM: CHEST  2 VIEW  COMPARISON:  DG CHEST 1V PORT dated 12/13/2012  FINDINGS: Cardiomediastinal silhouette is unremarkable. The lungs are clear without pleural effusions or focal consolidations. Trachea projects midline and there is no pneumothorax. Soft tissue planes and included osseous structures are non-suspicious.  IMPRESSION: No active cardiopulmonary disease.   Electronically Signed   By: Elon Alas   On: 07/22/2013 00:25   Patient requesting to be placed back on her water pill. She was given 25 mg HCTZ. Plan discharge home and stop metoprolol, restart HCTZ and follow up with primary care physician for further evaluation. No hypoxia, pulmonary edema on imaging or significant lab abnormalities to suggest need for admission at this time. She is stable and appropriate for outpatient management.  MDM  Diagnosis: Peripheral edema  Evaluated with labs and imaging reviewed as above. Medications provided. Vital signs and nurse's notes reviewed and considered.    I personally performed the services described in this documentation, which was scribed in my presence. The recorded information has been reviewed and is accurate.     Teressa Lower, MD 07/22/13 [phone]

## 2013-07-22 ENCOUNTER — Encounter: Payer: Self-pay | Admitting: Internal Medicine

## 2013-07-22 ENCOUNTER — Emergency Department (HOSPITAL_COMMUNITY)
Admission: EM | Admit: 2013-07-22 | Discharge: 2013-07-22 | Disposition: A | Discharge status: Home / Self Care | Payer: Medicare Other | Attending: Emergency Medicine | Admitting: Emergency Medicine

## 2013-07-22 ENCOUNTER — Telehealth: Payer: Self-pay | Admitting: *Deleted

## 2013-07-22 ENCOUNTER — Ambulatory Visit (INDEPENDENT_AMBULATORY_CARE_PROVIDER_SITE_OTHER): Payer: Medicare Other | Admitting: Internal Medicine

## 2013-07-22 VITALS — BP 156/91 | HR 69 | Temp 98.1°F | Ht 64.0 in | Wt 216.1 lb

## 2013-07-22 DIAGNOSIS — R51 Headache: Secondary | ICD-10-CM | POA: Diagnosis not present

## 2013-07-22 DIAGNOSIS — J069 Acute upper respiratory infection, unspecified: Secondary | ICD-10-CM | POA: Diagnosis not present

## 2013-07-22 DIAGNOSIS — R0602 Shortness of breath: Secondary | ICD-10-CM | POA: Diagnosis not present

## 2013-07-22 DIAGNOSIS — N938 Other specified abnormal uterine and vaginal bleeding: Secondary | ICD-10-CM | POA: Diagnosis not present

## 2013-07-22 DIAGNOSIS — R609 Edema, unspecified: Secondary | ICD-10-CM

## 2013-07-22 DIAGNOSIS — N925 Other specified irregular menstruation: Secondary | ICD-10-CM | POA: Diagnosis not present

## 2013-07-22 DIAGNOSIS — E119 Type 2 diabetes mellitus without complications: Secondary | ICD-10-CM | POA: Diagnosis not present

## 2013-07-22 DIAGNOSIS — I10 Essential (primary) hypertension: Secondary | ICD-10-CM

## 2013-07-22 DIAGNOSIS — F17200 Nicotine dependence, unspecified, uncomplicated: Secondary | ICD-10-CM | POA: Diagnosis not present

## 2013-07-22 DIAGNOSIS — R634 Abnormal weight loss: Secondary | ICD-10-CM | POA: Diagnosis not present

## 2013-07-22 LAB — COMPREHENSIVE METABOLIC PANEL
ALT: 21 U/L (ref 0–35)
AST: 25 U/L (ref 0–37)
Albumin: 3.2 g/dL — ABNORMAL LOW (ref 3.5–5.2)
Alkaline Phosphatase: 76 U/L (ref 39–117)
BUN: 14 mg/dL (ref 6–23)
CO2: 24 meq/L (ref 19–32)
CREATININE: 0.98 mg/dL (ref 0.50–1.10)
Calcium: 9.2 mg/dL (ref 8.4–10.5)
Chloride: 101 mEq/L (ref 96–112)
GFR calc Af Amer: 80 mL/min — ABNORMAL LOW (ref >90)
GFR, EST NON AFRICAN AMERICAN: 69 mL/min — AB (ref >90)
Glucose, Bld: 169 mg/dL — ABNORMAL HIGH (ref 70–99)
Potassium: 3.8 mEq/L (ref 3.7–5.3)
Sodium: 138 mEq/L (ref 137–147)
Total Bilirubin: <0.2 mg/dL — ABNORMAL LOW (ref 0.3–1.2)
Total Protein: 6.5 g/dL (ref 6.0–8.3)

## 2013-07-22 LAB — I-STAT TROPONIN, ED: TROPONIN I, POC: 0 ng/mL (ref 0.00–0.08)

## 2013-07-22 LAB — I-STAT CHEM 8, ED
BUN: 14 mg/dL (ref 6–23)
CALCIUM ION: 1.22 mmol/L (ref 1.12–1.23)
CHLORIDE: 103 meq/L (ref 96–112)
Creatinine, Ser: 1 mg/dL (ref 0.50–1.10)
GLUCOSE: 156 mg/dL — AB (ref 70–99)
HCT: 33 % — ABNORMAL LOW (ref 36.0–46.0)
HEMOGLOBIN: 11.2 g/dL — AB (ref 12.0–15.0)
Potassium: 3.7 mEq/L (ref 3.7–5.3)
Sodium: 138 mEq/L (ref 137–147)
TCO2: 23 mmol/L (ref 0–100)

## 2013-07-22 LAB — CBC
HEMATOCRIT: 30 % — AB (ref 36.0–46.0)
Hemoglobin: 10 g/dL — ABNORMAL LOW (ref 12.0–15.0)
MCH: 27 pg (ref 26.0–34.0)
MCHC: 33.3 g/dL (ref 30.0–36.0)
MCV: 81.1 fL (ref 78.0–100.0)
Platelets: 204 10*3/uL (ref 150–400)
RBC: 3.7 MIL/uL — ABNORMAL LOW (ref 3.87–5.11)
RDW: 17.5 % — AB (ref 11.5–15.5)
WBC: 7.7 10*3/uL (ref 4.0–10.5)

## 2013-07-22 LAB — PRO B NATRIURETIC PEPTIDE: PRO B NATRI PEPTIDE: 565.2 pg/mL — AB (ref 0–125)

## 2013-07-22 MED ORDER — ACETAMINOPHEN 500 MG PO TABS: 1000.0000 mg | ORAL_TABLET | Freq: Four times a day (QID) | ORAL | Status: DC | PRN

## 2013-07-22 MED ORDER — HYDROCHLOROTHIAZIDE 25 MG PO TABS
ORAL_TABLET | ORAL | Status: AC
  Administered 2013-07-22: 25 mg
  Filled 2013-07-22: qty 1

## 2013-07-22 MED ORDER — HYDROCHLOROTHIAZIDE 12.5 MG PO TABS: 12.5000 mg | ORAL_TABLET | Freq: Every day | ORAL | Status: DC

## 2013-07-22 NOTE — Telephone Encounter (Signed)
Pt seen in ED yesterday for fluid retention. She is asking if she needs to take additional lasix today.  Edema has improved.  Will see pt at 3:30

## 2013-07-22 NOTE — Discharge Instructions (Signed)
Peripheral Edema Stop metoprolol and restart HCTZ as discussed. Call your doctor today for followup.   You have swelling in your legs (peripheral edema). This swelling is due to excess accumulation of salt and water in your body. Edema may be a sign of heart, kidney or liver disease, or a side effect of a medication. It may also be due to problems in the leg veins. Elevating your legs and using special support stockings may be very helpful, if the cause of the swelling is due to poor venous circulation. Avoid long periods of standing, whatever the cause. Treatment of edema depends on identifying the cause. Chips, pretzels, pickles and other salty foods should be avoided. Restricting salt in your diet is almost always needed. Water pills (diuretics) are often used to remove the excess salt and water from your body via urine. These medicines prevent the kidney from reabsorbing sodium. This increases urine flow. Diuretic treatment may also result in lowering of potassium levels in your body. Potassium supplements may be needed if you have to use diuretics daily. Daily weights can help you keep track of your progress in clearing your edema. You should call your caregiver for follow up care as recommended. SEEK IMMEDIATE MEDICAL CARE IF:   You have increased swelling, pain, redness, or heat in your legs.  You develop shortness of breath, especially when lying down.  You develop chest or abdominal pain, weakness, or fainting.  You have a fever. Document Released: 05/10/2004 Document Revised: 06/25/2011 Document Reviewed: 04/20/2009 [facility] Patient Information 2014 [name].

## 2013-07-22 NOTE — Patient Instructions (Signed)
Please start taking 12.5 mg of HCTZ daily to control your blood pressure.  Please take tylenol as needed for headache.  Please stop taking hydroxyzine as it may be causing some of your symptoms.  Please contact doctor for new or worsening symptoms.

## 2013-07-22 NOTE — ED Notes (Signed)
Patient given discharge instructions, as instructed by Dr. Marnette Burgess, during downtime. Patient signed on downtime paper form. Patient instructed to stop taking metoprolol, to continue to take hctz 12.5mg  once daily, to follow up with PCP in 1-2 days, and to return to ED for any new or worsening symptoms.

## 2013-07-22 NOTE — Progress Notes (Signed)
Patient ID: RAVENNA LEGORE, female   DOB: June 27, 1965, 48 y.o.   MRN: [id number]    Subjective:   Patient ID: [name] female   DOB: 06/19/1965 48 y.o.   MRN: [id number]  HPI: [name] is a 48 y.o. woman who presents for ED f/u. The patient presented to the ED 1 day previous with a cc of SOB and weight gain. She recently discontinued hctz 2/2 concern regarding orthostatic symptoms. Since then she gained 12 pounds. She noticed swelling on her legs. In the ED, she was given lasix and discharged home. Today, her swelling is resolved and she is feeling much better. She is essentially at her baseline weight.  Denies SOB, chest pain, PND, orthopnea, LE swelling. No abdominal pain. Admits to increased urination with lasix. No fevers or chills. Today she complains of headache.    Past Medical History  Diagnosis Date  . Arteriosclerotic cardiovascular disease (ASCVD)     Minimal at cath in [facility].stress nuclear study in 8/08 with nl EF; neg stress echo in 2010  . Diabetes mellitus, type 2 2000    Onset in 2000; no insulin  . Hyperlipidemia   . Hypertension `    during treatment with Geodon  . Gastroesophageal reflux disease     Schatzki's ring  . Anemia, iron deficiency   . Alcohol abuse   . Depression   . Community acquired pneumonia 01/03/10, 05/2010, 04/2012    2011; with pleural effusion-[facility] acute resp failure; intubated in Jan 2014 (HMPV pneumonia)  . Obesity   . Schizoaffective disorder     requiring multiple psychiatric admissions  . Dysphagia   . Diastolic dysfunction     grade 2 per echo 2011  . Pulmonary hypertension 05/02/2012    Patient needs repeat echo in 06/2012   . History of alcohol abuse 07/22/2007    Qualifier: Diagnosis of  By: Lenn Cal     Current Outpatient Prescriptions  Medication Sig Dispense Refill  . divalproex (DEPAKOTE ER) 500 MG 24 hr tablet       . glucose blood (ACCU-CHEK ACTIVE STRIPS) test strip Use as  instructed  100 each  12  . glucose monitoring kit (FREESTYLE) monitoring kit 1 each by Does not apply route as needed for other.  1 each  0  . Insulin Glargine (LANTUS SOLOSTAR) 100 UNIT/ML SOPN Inject 18 Units into the skin at bedtime.       Elmore Guise Devices MISC Check CBGs 2-3 times daily  100 each  11  . lovastatin (MEVACOR) 20 MG tablet Take 20 mg by mouth at bedtime.      . metFORMIN (GLUCOPHAGE) 1000 MG tablet TAKE (1) TABLET BY MOUTH TWICE DAILY WITH MEALS.  60 tablet  11  . metoprolol (LOPRESSOR) 50 MG tablet TAKE 1 TABLET BY MOUTH TWICE DAILY.  60 tablet  11  . mometasone (NASONEX) 50 MCG/ACT nasal spray Place 4 sprays into the nose daily.  17 g  2  . NEXIUM 20 MG capsule TAKE ONE CAPSULE BY MOUTH DAILY.  30 capsule  3  . NOVOFINE 32G X 6 MM MISC       . ziprasidone (GEODON) 80 MG capsule Take 1 capsule (80 mg total) by mouth 2 (two) times daily with a meal.  60 capsule  0  . acetaminophen (TYLENOL) 500 MG tablet Take 2 tablets (1,000 mg total) by mouth every 6 (six) hours as needed for headache.  100 tablet  2  .  albuterol (PROVENTIL HFA;VENTOLIN HFA) 108 (90 BASE) MCG/ACT inhaler Inhale 2 puffs into the lungs 3 (three) times daily as needed for wheezing or shortness of breath.  1 Inhaler  2  . Ascorbic Acid (VITAMIN C) 1000 MG tablet Take 1,000 mg by mouth daily.      . hydrochlorothiazide (HYDRODIURIL) 12.5 MG tablet Take 1 tablet (12.5 mg total) by mouth daily.  30 tablet  3  . ibuprofen (ADVIL,MOTRIN) 200 MG tablet Take 400 mg by mouth every 8 (eight) hours as needed. Aches and pains, fever      . ondansetron (ZOFRAN ODT) 8 MG disintegrating tablet Take 1 tablet (8 mg total) by mouth every 8 (eight) hours as needed for nausea.  20 tablet  0   No current facility-administered medications for this visit.   Family History  Problem Relation Age of Onset  . Colon cancer Other   . Hypertension Mother   . Stroke Father     deceased at age 60  . Heart disease Sister   . Anesthesia  problems Neg Hx   . Hypotension Neg Hx   . Malignant hyperthermia Neg Hx   . Pseudochol deficiency Neg Hx    History   Social History  . Marital Status: Single    Spouse Name: N/A    Number of Children: N/A  . Years of Education: 12   Occupational History  . Disability   . UNEMPLOYED    Social History Main Topics  . Smoking status: Heavy Tobacco Smoker -- 2.00 packs/day for 30 years    Types: Cigarettes    Start date: 05/18/2012  . Smokeless tobacco: Never Used     Comment: Has almostquit totally.  . Alcohol Use: No  . Drug Use: No  . Sexual Activity: None   Other Topics Concern  . 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 Objective:  Physical Exam: Filed Vitals:   07/22/13 1607 07/22/13 1641  BP: 189/100 156/91  Pulse:  69  Temp: 98.1 F (36.7 C)   TempSrc: Oral   Height: _0  (1.626 m)   Weight: 216 lb 1.6 oz (98.022 kg)   SpO2: 99%    Physical Exam  Constitutional: She is oriented to person, place, and time. She appears well-developed and well-nourished. No distress.  HENT:  Head: Normocephalic and atraumatic.  Cardiovascular: Normal rate, regular rhythm, normal heart sounds and intact distal pulses.  Exam reveals no gallop and no friction rub.   No murmur heard. Pulmonary/Chest: Effort normal and breath sounds normal. No respiratory distress. She has no wheezes. She has no rales.  Musculoskeletal:  Trace pedal edema  Neurological: She is alert and oriented to person, place, and time.  Skin: She is not diaphoretic.  Psychiatric: She has a normal mood and affect. Her behavior is normal.    Assessment & Plan:

## 2013-07-23 ENCOUNTER — Other Ambulatory Visit: Payer: Self-pay

## 2013-07-23 DIAGNOSIS — R51 Headache: Secondary | ICD-10-CM | POA: Insufficient documentation

## 2013-07-23 DIAGNOSIS — R519 Headache, unspecified: Secondary | ICD-10-CM | POA: Insufficient documentation

## 2013-07-23 NOTE — Assessment & Plan Note (Signed)
The patient has no concerning features of her headache. I recommend using tylenol prn headache. If it persists or she develops more concerning signs additional workup may be indicated.

## 2013-07-23 NOTE — Assessment & Plan Note (Signed)
Patient is hypertensive today. She also had signs of fluid overload that has resolved with 1 dose of lasix diuresis. The patients HCTZ had been discontinued due to concern about orthostatic symptoms. The patient admitted to using hydroxyzine, which was not on med list as it was prescribed by psychiatrist. She uses this medication prn for agitation/anxiety. I believe that this medication may have been causing some of her dizziness symptoms. I elected to d/c hydroxyzine and restart HCTZ at a lower dose 12.5 mg qd. Patient agrees with the plan.

## 2013-07-24 DIAGNOSIS — Z79899 Other long term (current) drug therapy: Secondary | ICD-10-CM | POA: Diagnosis not present

## 2013-07-24 DIAGNOSIS — F319 Bipolar disorder, unspecified: Secondary | ICD-10-CM | POA: Diagnosis not present

## 2013-07-25 NOTE — Progress Notes (Signed)
Case discussed with Dr. Komanski at time of visit.  We reviewed the resident's history and exam and pertinent patient test results.  I agree with the assessment, diagnosis, and plan of care documented in the resident's note. 

## 2013-09-01 ENCOUNTER — Other Ambulatory Visit: Payer: Self-pay | Admitting: Internal Medicine

## 2013-09-04 ENCOUNTER — Ambulatory Visit (INDEPENDENT_AMBULATORY_CARE_PROVIDER_SITE_OTHER): Payer: Medicare Other | Admitting: Internal Medicine

## 2013-09-04 ENCOUNTER — Encounter: Payer: Self-pay | Admitting: Internal Medicine

## 2013-09-04 VITALS — BP 130/83 | HR 87 | Temp 98.3°F | Ht 64.0 in | Wt 208.9 lb

## 2013-09-04 DIAGNOSIS — K219 Gastro-esophageal reflux disease without esophagitis: Secondary | ICD-10-CM | POA: Diagnosis not present

## 2013-09-04 DIAGNOSIS — J00 Acute nasopharyngitis [common cold]: Secondary | ICD-10-CM

## 2013-09-04 DIAGNOSIS — I10 Essential (primary) hypertension: Secondary | ICD-10-CM | POA: Diagnosis not present

## 2013-09-04 DIAGNOSIS — J069 Acute upper respiratory infection, unspecified: Secondary | ICD-10-CM | POA: Diagnosis not present

## 2013-09-04 DIAGNOSIS — I2789 Other specified pulmonary heart diseases: Secondary | ICD-10-CM | POA: Diagnosis not present

## 2013-09-04 DIAGNOSIS — R42 Dizziness and giddiness: Secondary | ICD-10-CM | POA: Diagnosis not present

## 2013-09-04 DIAGNOSIS — F17200 Nicotine dependence, unspecified, uncomplicated: Secondary | ICD-10-CM | POA: Diagnosis not present

## 2013-09-04 DIAGNOSIS — Z0000 Encounter for general adult medical examination without abnormal findings: Secondary | ICD-10-CM | POA: Diagnosis not present

## 2013-09-04 DIAGNOSIS — E785 Hyperlipidemia, unspecified: Secondary | ICD-10-CM | POA: Diagnosis not present

## 2013-09-04 DIAGNOSIS — E119 Type 2 diabetes mellitus without complications: Secondary | ICD-10-CM

## 2013-09-04 LAB — GLUCOSE, CAPILLARY: Glucose-Capillary: 196 mg/dL — ABNORMAL HIGH (ref 70–99)

## 2013-09-04 LAB — POCT GLYCOSYLATED HEMOGLOBIN (HGB A1C): Hemoglobin A1C: 7.9

## 2013-09-04 MED ORDER — GUAIFENESIN-DM 100-10 MG/5ML PO SYRP: 5.0000 mL | ORAL_SOLUTION | ORAL | Status: DC | PRN

## 2013-09-04 MED ORDER — LORATADINE-PSEUDOEPHEDRINE ER 10-240 MG PO TB24: 1.0000 | ORAL_TABLET | Freq: Every day | ORAL | Status: DC

## 2013-09-04 NOTE — Assessment & Plan Note (Signed)
A1C slightly improved to 7.9 today from 8.3 from feb 2015.  Plans: Recommended to continue the current medical regimen and follow up with her PCP as was recommended.

## 2013-09-04 NOTE — Progress Notes (Signed)
Case discussed with Dr. Boggala soon after the resident saw the patient.  We reviewed the resident's history and exam and pertinent patient test results.  I agree with the assessment, diagnosis, and plan of care documented in the resident's note. 

## 2013-09-04 NOTE — Assessment & Plan Note (Signed)
Sympoms and physical exam consistent with upper respiratory illness, most likely viral etiology. Discussed with the attending regarding further management and plan.  Plans: Conservative management with Anti-histamines, Decongestants, Cough suppressants/mucolytic agent. Recommended bed rest for a few days along with fluid replenishment. Follow up as needed or if symptoms persist or worsen.

## 2013-09-04 NOTE — Patient Instructions (Signed)
Take the medications as recommended. If your symptoms persist or worsen, please give [name] a call or seek medical help.

## 2013-09-04 NOTE — Progress Notes (Signed)
Subjective:   Patient ID: [name] female   DOB: Feb 02, 1966 48 y.o.   MRN: [id number]  HPI: [name] is a 48 y.o. woman with PMH significant for HTN, DM-II, HLD, COPD, diastolic CHF comes to the office with CC of runny nose, sore throat, dry cough x 4 days.  Patient reports that her grand son, mother and son all got sick last week with cough and cold. Patient reports that she started having runny nose, nasal congestion, sore throat, dry cough for the last 4 days. She denies any fever but complains of chills. Patient denies any chest pain, SOB, nausea, vomiting, abdominal pain, diarrhea, constipation. Patient denies any OTC allergy or cough medicines and is requesting for a cough medicine.  She denies any dysuria, urinary frequency, headaches, dizziness.   She denies any other complaints.  Past Medical History  Diagnosis Date  . Arteriosclerotic cardiovascular disease (ASCVD)     Minimal at cath in [facility].stress nuclear study in 8/08 with nl EF; neg stress echo in 2010  . Diabetes mellitus, type 2 2000    Onset in 2000; no insulin  . Hyperlipidemia   . Hypertension `    during treatment with Geodon  . Gastroesophageal reflux disease     Schatzki's ring  . Anemia, iron deficiency   . Alcohol abuse   . Depression   . Community acquired pneumonia 01/03/10, 05/2010, 04/2012    2011; with pleural effusion-[facility] acute resp failure; intubated in Jan 2014 (HMPV pneumonia)  . Obesity   . Schizoaffective disorder     requiring multiple psychiatric admissions  . Dysphagia   . Diastolic dysfunction     grade 2 per echo 2011  . Pulmonary hypertension 05/02/2012    Patient needs repeat echo in 06/2012   . History of alcohol abuse 07/22/2007    Qualifier: Diagnosis of  By: Lenn Cal     Current Outpatient Prescriptions  Medication Sig Dispense Refill  . acetaminophen (TYLENOL) 500 MG tablet Take 2 tablets (1,000 mg total) by mouth every 6 (six) hours  as needed for headache.  100 tablet  2  . albuterol (PROVENTIL HFA;VENTOLIN HFA) 108 (90 BASE) MCG/ACT inhaler Inhale 2 puffs into the lungs 3 (three) times daily as needed for wheezing or shortness of breath.  1 Inhaler  2  . Ascorbic Acid (VITAMIN C) 1000 MG tablet Take 1,000 mg by mouth daily.      . divalproex (DEPAKOTE ER) 500 MG 24 hr tablet       . glucose blood (ACCU-CHEK ACTIVE STRIPS) test strip Use as instructed  100 each  12  . glucose monitoring kit (FREESTYLE) monitoring kit 1 each by Does not apply route as needed for other.  1 each  0  .      . hydrochlorothiazide (HYDRODIURIL) 12.5 MG tablet Take 1 tablet (12.5 mg total) by mouth daily.  30 tablet  3  . ibuprofen (ADVIL,MOTRIN) 200 MG tablet Take 400 mg by mouth every 8 (eight) hours as needed. Aches and pains, fever      . Insulin Glargine (LANTUS SOLOSTAR) 100 UNIT/ML SOPN Inject 18 Units into the skin at bedtime.       Elmore Guise Devices MISC Check CBGs 2-3 times daily  100 each  11  .      . lovastatin (MEVACOR) 20 MG tablet Take 1 tablet (20 mg total) by mouth at bedtime.  90 tablet  3  . metFORMIN (GLUCOPHAGE) 1000  MG tablet TAKE (1) TABLET BY MOUTH TWICE DAILY WITH MEALS.  60 tablet  11  . metoprolol (LOPRESSOR) 50 MG tablet TAKE 1 TABLET BY MOUTH TWICE DAILY.  60 tablet  11  . mometasone (NASONEX) 50 MCG/ACT nasal spray Place 4 sprays into the nose daily.  17 g  2  . NEXIUM 20 MG capsule TAKE ONE CAPSULE BY MOUTH DAILY.  30 capsule  3  . NOVOFINE 32G X 6 MM MISC USE AS DIRECTED BEFORE BREAKFAST.  100 each  11  . ondansetron (ZOFRAN ODT) 8 MG disintegrating tablet Take 1 tablet (8 mg total) by mouth every 8 (eight) hours as needed for nausea.  20 tablet  0  . ziprasidone (GEODON) 80 MG capsule Take 1 capsule (80 mg total) by mouth 2 (two) times daily with a meal.  60 capsule  0   No current facility-administered medications for this visit.   Family History  Problem Relation Age of Onset  . Colon cancer Other   .  Hypertension Mother   . Stroke Father     deceased at age 29  . Heart disease Sister   . Anesthesia problems Neg Hx   . Hypotension Neg Hx   . Malignant hyperthermia Neg Hx   . Pseudochol deficiency Neg Hx    History   Social History  . Marital Status: Single    Spouse Name: N/A    Number of Children: N/A  . Years of Education: 12   Occupational History  . Disability   . UNEMPLOYED    Social History Main Topics  . Smoking status: Heavy Tobacco Smoker -- 2.00 packs/day for 30 years    Types: Cigarettes    Start date: 05/18/2012  . Smokeless tobacco: Never Used     Comment: Has almostquit totally.  . Alcohol Use: No  . Drug Use: No  . Sexual Activity: Not on file   Other Topics Concern  . 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 Objective:  Physical Exam: Filed Vitals:   09/04/13 1050  BP: 130/83  Pulse: 87  Temp: 98.3 F (36.8 C)  TempSrc: Oral  Height: _0  (1.626 m)  Weight: 208 lb 14.4 oz (94.756 kg)  SpO2: 98%   Constitutional: Vital signs reviewed.  Patient is a well-developed and well-nourished and is in no acute distress and cooperative with exam. Alert and oriented x3.  Head: Normocephalic and atraumatic Ear: TM normal bilaterally. Mild wax noted in both ears. Nose: Moderately enlarged inferior turbinates on both sides. Mouth: Moderately enlarged tonsils on both sides without any exudates. Posterior pharyngeal erythema without any exudates. Neck: Supple, No cervical LN's. Cardiovascular: RRR, S1 normal, S2 normal, no MRG. Pulmonary/Chest: normal respiratory effort, CTAB, no wheezes, rales, or rhonchi Neurological: A&O x3 Skin: Warm, dry and  intact. Psychiatric: Normal mood and affect.   Assessment & Plan:

## 2013-09-10 ENCOUNTER — Encounter (HOSPITAL_COMMUNITY): Payer: Self-pay | Admitting: Emergency Medicine

## 2013-09-10 ENCOUNTER — Emergency Department (HOSPITAL_COMMUNITY)
Admission: EM | Admit: 2013-09-10 | Discharge: 2013-09-10 | Disposition: A | Discharge status: Home / Self Care | Payer: Medicare Other | Attending: Emergency Medicine | Admitting: Emergency Medicine

## 2013-09-10 DIAGNOSIS — F3289 Other specified depressive episodes: Secondary | ICD-10-CM | POA: Insufficient documentation

## 2013-09-10 DIAGNOSIS — F259 Schizoaffective disorder, unspecified: Secondary | ICD-10-CM | POA: Diagnosis not present

## 2013-09-10 DIAGNOSIS — H9209 Otalgia, unspecified ear: Secondary | ICD-10-CM | POA: Diagnosis not present

## 2013-09-10 DIAGNOSIS — Z792 Long term (current) use of antibiotics: Secondary | ICD-10-CM | POA: Insufficient documentation

## 2013-09-10 DIAGNOSIS — F329 Major depressive disorder, single episode, unspecified: Secondary | ICD-10-CM | POA: Insufficient documentation

## 2013-09-10 DIAGNOSIS — E669 Obesity, unspecified: Secondary | ICD-10-CM | POA: Diagnosis not present

## 2013-09-10 DIAGNOSIS — Z79899 Other long term (current) drug therapy: Secondary | ICD-10-CM | POA: Insufficient documentation

## 2013-09-10 DIAGNOSIS — Z862 Personal history of diseases of the blood and blood-forming organs and certain disorders involving the immune mechanism: Secondary | ICD-10-CM | POA: Diagnosis not present

## 2013-09-10 DIAGNOSIS — J069 Acute upper respiratory infection, unspecified: Secondary | ICD-10-CM | POA: Diagnosis not present

## 2013-09-10 DIAGNOSIS — E119 Type 2 diabetes mellitus without complications: Secondary | ICD-10-CM | POA: Insufficient documentation

## 2013-09-10 DIAGNOSIS — E785 Hyperlipidemia, unspecified: Secondary | ICD-10-CM | POA: Insufficient documentation

## 2013-09-10 DIAGNOSIS — F17200 Nicotine dependence, unspecified, uncomplicated: Secondary | ICD-10-CM | POA: Insufficient documentation

## 2013-09-10 DIAGNOSIS — Z794 Long term (current) use of insulin: Secondary | ICD-10-CM | POA: Diagnosis not present

## 2013-09-10 DIAGNOSIS — Z8709 Personal history of other diseases of the respiratory system: Secondary | ICD-10-CM | POA: Diagnosis not present

## 2013-09-10 DIAGNOSIS — I10 Essential (primary) hypertension: Secondary | ICD-10-CM | POA: Insufficient documentation

## 2013-09-10 DIAGNOSIS — K219 Gastro-esophageal reflux disease without esophagitis: Secondary | ICD-10-CM | POA: Insufficient documentation

## 2013-09-10 DIAGNOSIS — Z880 Allergy status to penicillin: Secondary | ICD-10-CM | POA: Diagnosis not present

## 2013-09-10 MED ORDER — FEXOFENADINE HCL 60 MG PO TABS: 60.0000 mg | ORAL_TABLET | Freq: Two times a day (BID) | ORAL | Status: DC

## 2013-09-10 MED ORDER — MECLIZINE HCL 50 MG PO TABS: 50.0000 mg | ORAL_TABLET | Freq: Three times a day (TID) | ORAL | Status: DC | PRN

## 2013-09-10 MED ORDER — AZITHROMYCIN 250 MG PO TABS: 250.0000 mg | ORAL_TABLET | Freq: Every day | ORAL | Status: DC

## 2013-09-10 NOTE — Discharge Instructions (Signed)
Use your inhaler 2 puffs every 4 hours for the next 24 hours, then every 4 hours as needed - you should also take the claritin or alternatively switch to the allegra - take before bedtime.  The Zithromax is an antibiotic which may help as well.  Antivert may help with the vertigo - this medicine can make you drowsy and you should not take it while driving.  Please call your doctor for a followup appointment within 24-48 hours. When you talk to your doctor please let them know that you were seen in the emergency department and have them acquire all of your records so that they can discuss the findings with you and formulate a treatment plan to fully care for your new and ongoing problems.

## 2013-09-10 NOTE — ED Provider Notes (Signed)
CSN: [id number]     Arrival date & time 09/10/13  0241 History   First MD Initiated Contact with Patient 09/10/13 [phone]     Chief Complaint  Patient presents with  . Otalgia     (Consider location/radiation/quality/duration/timing/severity/associated sxs/prior Treatment) HPI Comments: 48 year old female, has a history of approximately one week of upper respiratory symptoms including dry cough, occasionally productive, nasal congestion, sore throat and bilateral ear pain. This is persistent, not associated with fevers chills nausea or vomiting and nothing seems to make it better. The patient has started taking Claritin nondrowsy formulation and has felt as though she has developed some vertigo intermittently with head movement. She has stopped taking the Claritin for this reason.  Patient is a 48 y.o. female presenting with ear pain. The history is provided by the patient.  Otalgia   Past Medical History  Diagnosis Date  . Arteriosclerotic cardiovascular disease (ASCVD)     Minimal at cath in [facility].stress nuclear study in 8/08 with nl EF; neg stress echo in 2010  . Diabetes mellitus, type 2 2000    Onset in 2000; no insulin  . Hyperlipidemia   . Hypertension `    during treatment with Geodon  . Gastroesophageal reflux disease     Schatzki's ring  . Anemia, iron deficiency   . Alcohol abuse   . Depression   . Community acquired pneumonia 01/03/10, 05/2010, 04/2012    2011; with pleural effusion-[facility] acute resp failure; intubated in Jan 2014 (HMPV pneumonia)  . Obesity   . Schizoaffective disorder     requiring multiple psychiatric admissions  . Dysphagia   . Diastolic dysfunction     grade 2 per echo 2011  . Pulmonary hypertension 05/02/2012    Patient needs repeat echo in 06/2012   . History of alcohol abuse 07/22/2007    Qualifier: Diagnosis of  By: Lenn Cal     Past Surgical History  Procedure Laterality Date  . Dilation and curettage, diagnostic /  therapeutic  1992  . Esophagogastroduodenoscopy  09/16/08    Dr. Trevor Iha hiatal hernia/excoriations involving the cardia and mucosa consistent with trauma, antral erosions  of linear petechiae ? gastritis versus early gastric antral vascular  ectasia.Marland Kitchen biopsy showed reactive gastropathy. No H. pylori.  . Esophagogastroduodenoscopy  09/2007    Dr. Evalee Mutton ring, dilated to 54 French Maloney dilator, small hiatal hernia, antral erosions, biopsies reactive gastropathy.  Azzie Almas dilation  07/17/2011    [facility] sedation-->distal esophageal stricture s/p dilation, chronic gastritis, multiple ulcers in stomach. no h.pylori  . Colonoscopy  01/2006    internal hemorrhoids  . Colonoscopy  01/10/2012    Procedure: COLONOSCOPY;  Surgeon: Daneil Dolin, MD;  Location: [facility];  Service: Endoscopy;  Laterality: N/A;  entered cecum @ [phone] ; total cecal withdrawal time = 8 minutes   Family History  Problem Relation Age of Onset  . Colon cancer Other   . Hypertension Mother   . Stroke Father     deceased at age 8  . Heart disease Sister   . Anesthesia problems Neg Hx   . Hypotension Neg Hx   . Malignant hyperthermia Neg Hx   . Pseudochol deficiency Neg Hx    History  Substance Use Topics  . Smoking status: Heavy Tobacco Smoker -- 2.00 packs/day for 30 years    Types: Cigarettes    Start date: 05/18/2012  . Smokeless tobacco: Never Used     Comment: Has almostquit totally.  . Alcohol  Use: No   OB History   Grav Para Term Preterm Abortions TAB SAB Ect Mult Living   3 2 2  1 1    2      Review of Systems  HENT: Positive for ear pain.   All other systems reviewed and are negative.     Allergies  Metronidazole; Orange; Shrimp; Penicillins; Sulfonamide derivatives; Glipizide; and Sulfamethoxazole-trimethoprim  Home Medications   Prior to Admission medications   Medication Sig Start Date End Date Taking? Authorizing Provider  acetaminophen (TYLENOL) 500 MG tablet Take 2  tablets (1,000 mg total) by mouth every 6 (six) hours as needed for headache. 07/22/13 07/22/14 Yes Marrion Coy, MD  albuterol (PROVENTIL HFA;VENTOLIN HFA) 108 (90 BASE) MCG/ACT inhaler Inhale 2 puffs into the lungs 3 (three) times daily as needed for wheezing or shortness of breath. 10/27/12  Yes Rigoberto Noel, MD  Ascorbic Acid (VITAMIN C) 1000 MG tablet Take 1,000 mg by mouth daily.   Yes Historical Provider, MD  esomeprazole (NEXIUM) 20 MG capsule Take 20 mg by mouth daily at 12 noon.   Yes Historical Provider, MD  guaiFENesin-dextromethorphan (ROBITUSSIN DM) 100-10 MG/5ML syrup Take 5 mLs by mouth every 4 (four) hours as needed for cough. 09/04/13  Yes Carter Kitten, MD  hydrochlorothiazide (MICROZIDE) 12.5 MG capsule Take 25 mg by mouth daily.   Yes Historical Provider, MD  ibuprofen (ADVIL,MOTRIN) 200 MG tablet Take 400 mg by mouth every 8 (eight) hours as needed. Aches and pains, fever   Yes Historical Provider, MD  Insulin Glargine (LANTUS SOLOSTAR) 100 UNIT/ML SOPN Inject 24 Units into the skin at bedtime.    Yes Historical Provider, MD  loratadine-pseudoephedrine (CLARITIN-D 24-HOUR) 10-240 MG per 24 hr tablet Take 1 tablet by mouth daily as needed for allergies.   Yes Historical Provider, MD  lovastatin (MEVACOR) 20 MG tablet Take 1 tablet (20 mg total) by mouth at bedtime. 09/01/13  Yes Neema Bobbie Stack, MD  metFORMIN (GLUCOPHAGE) 1000 MG tablet Take 1,000 mg by mouth 2 (two) times daily with a meal.   Yes Historical Provider, MD  metoprolol (LOPRESSOR) 50 MG tablet Take 50 mg by mouth 2 (two) times daily.   Yes Historical Provider, MD  ziprasidone (GEODON) 80 MG capsule Take 1 capsule (80 mg total) by mouth 2 (two) times daily with a meal. 03/26/12  Yes Waylan Boga, NP  azithromycin (ZITHROMAX Z-PAK) 250 MG tablet Take 1 tablet (250 mg total) by mouth daily. 500mg  PO day 1, then 250mg  PO days 205 09/10/13   Johnna Acosta, MD  fexofenadine (ALLEGRA) 60 MG tablet Take 1 tablet (60 mg  total) by mouth 2 (two) times daily. 09/10/13   Johnna Acosta, MD  meclizine (ANTIVERT) 50 MG tablet Take 1 tablet (50 mg total) by mouth 3 (three) times daily as needed. 09/10/13   Johnna Acosta, MD   BP 129/84  Pulse 91  Temp(Src) 98.8 F (37.1 C) (Oral)  Resp 20  SpO2 98%  LMP 09/02/2013 Physical Exam  Nursing note and vitals reviewed. Constitutional: She appears well-developed and well-nourished. No distress.  HENT:  Head: Normocephalic and atraumatic.  Mouth/Throat: Oropharynx is clear and moist. No oropharyngeal exudate.  Tympanic membranes clear and erythematous bilaterally, no purulent material, landmarks seen without difficulty  Eyes: Conjunctivae and EOM are normal. Pupils are equal, round, and reactive to light. Right eye exhibits no discharge. Left eye exhibits no discharge. No scleral icterus.  Neck: Normal range of motion. Neck supple. No JVD  present. No thyromegaly present.  Cardiovascular: Normal rate, regular rhythm, normal heart sounds and intact distal pulses.  Exam reveals no gallop and no friction rub.   No murmur heard. Pulmonary/Chest: Effort normal and breath sounds normal. No respiratory distress. She has no wheezes. She has no rales.  Abdominal: Soft. Bowel sounds are normal. She exhibits no distension and no mass. There is no tenderness.  Musculoskeletal: Normal range of motion. She exhibits no edema and no tenderness.  Lymphadenopathy:    She has no cervical adenopathy.  Neurological: She is alert. Coordination normal.  Skin: Skin is warm and dry. No rash noted. No erythema.  Psychiatric: She has a normal mood and affect. Her behavior is normal.    ED Course  Procedures (including critical care time) Labs Review Labs Reviewed - No data to display  Imaging Review No results found.    MDM   Final diagnoses:  URI (upper respiratory infection)    The patient is clear heart and lung sounds, vital signs are normal, no fever, no tachycardia, no  tachypnea and no hypoxia. She will need ongoing management/asymptomatic case of a upper respiratory infection or bronchitis, she already has albuterol metered-dose inhaler at home which she can continue to use, I have advised 2 puffs every 4 hours for 24 hours then 2 puffs every 4 hours as needed. Continue Claritin or use Allegra as an alternative and Antivert for vertigo. We'll also give Zithromax for possible additional bacterial coverage. The patient otherwise appears well and is comfortable with the plan.  Meds given in ED:  Medications - No data to display  New Prescriptions   AZITHROMYCIN (ZITHROMAX Z-PAK) 250 MG TABLET    Take 1 tablet (250 mg total) by mouth daily. 500mg  PO day 1, then 250mg  PO days 205   FEXOFENADINE (ALLEGRA) 60 MG TABLET    Take 1 tablet (60 mg total) by mouth 2 (two) times daily.   MECLIZINE (ANTIVERT) 50 MG TABLET    Take 1 tablet (50 mg total) by mouth 3 (three) times daily as needed.      Johnna Acosta, MD 09/10/13 [phone]

## 2013-09-10 NOTE — ED Notes (Signed)
Pt. reports bilateral ear ache for 2 weeks , denies injury/ no drainage .

## 2013-09-15 ENCOUNTER — Telehealth: Payer: Self-pay | Admitting: Internal Medicine

## 2013-09-15 ENCOUNTER — Encounter (HOSPITAL_COMMUNITY): Payer: Self-pay | Admitting: Emergency Medicine

## 2013-09-15 ENCOUNTER — Emergency Department (HOSPITAL_COMMUNITY): Payer: Medicare Other

## 2013-09-15 DIAGNOSIS — Z8659 Personal history of other mental and behavioral disorders: Secondary | ICD-10-CM | POA: Insufficient documentation

## 2013-09-15 DIAGNOSIS — F17200 Nicotine dependence, unspecified, uncomplicated: Secondary | ICD-10-CM | POA: Insufficient documentation

## 2013-09-15 DIAGNOSIS — Z8701 Personal history of pneumonia (recurrent): Secondary | ICD-10-CM | POA: Insufficient documentation

## 2013-09-15 DIAGNOSIS — E119 Type 2 diabetes mellitus without complications: Secondary | ICD-10-CM | POA: Insufficient documentation

## 2013-09-15 DIAGNOSIS — F3289 Other specified depressive episodes: Secondary | ICD-10-CM | POA: Diagnosis not present

## 2013-09-15 DIAGNOSIS — Z792 Long term (current) use of antibiotics: Secondary | ICD-10-CM | POA: Diagnosis not present

## 2013-09-15 DIAGNOSIS — I10 Essential (primary) hypertension: Secondary | ICD-10-CM | POA: Insufficient documentation

## 2013-09-15 DIAGNOSIS — R0789 Other chest pain: Secondary | ICD-10-CM | POA: Diagnosis not present

## 2013-09-15 DIAGNOSIS — Z8673 Personal history of transient ischemic attack (TIA), and cerebral infarction without residual deficits: Secondary | ICD-10-CM | POA: Diagnosis not present

## 2013-09-15 DIAGNOSIS — R079 Chest pain, unspecified: Secondary | ICD-10-CM | POA: Diagnosis not present

## 2013-09-15 DIAGNOSIS — R059 Cough, unspecified: Secondary | ICD-10-CM | POA: Diagnosis not present

## 2013-09-15 DIAGNOSIS — E669 Obesity, unspecified: Secondary | ICD-10-CM | POA: Insufficient documentation

## 2013-09-15 DIAGNOSIS — Z880 Allergy status to penicillin: Secondary | ICD-10-CM | POA: Diagnosis not present

## 2013-09-15 DIAGNOSIS — R0602 Shortness of breath: Secondary | ICD-10-CM | POA: Diagnosis not present

## 2013-09-15 DIAGNOSIS — Z794 Long term (current) use of insulin: Secondary | ICD-10-CM | POA: Insufficient documentation

## 2013-09-15 DIAGNOSIS — Z862 Personal history of diseases of the blood and blood-forming organs and certain disorders involving the immune mechanism: Secondary | ICD-10-CM | POA: Diagnosis not present

## 2013-09-15 DIAGNOSIS — F329 Major depressive disorder, single episode, unspecified: Secondary | ICD-10-CM | POA: Insufficient documentation

## 2013-09-15 DIAGNOSIS — Z79899 Other long term (current) drug therapy: Secondary | ICD-10-CM | POA: Insufficient documentation

## 2013-09-15 DIAGNOSIS — K219 Gastro-esophageal reflux disease without esophagitis: Secondary | ICD-10-CM | POA: Diagnosis not present

## 2013-09-15 DIAGNOSIS — E785 Hyperlipidemia, unspecified: Secondary | ICD-10-CM | POA: Insufficient documentation

## 2013-09-15 DIAGNOSIS — R05 Cough: Secondary | ICD-10-CM | POA: Diagnosis not present

## 2013-09-15 NOTE — ED Notes (Signed)
Pt c/o mid chest pain with cough x's 3-4 days.  Pt st's she was recently dx with URI and has finished antibiotics for same.

## 2013-09-15 NOTE — ED Notes (Signed)
EKG was obtained at 23:24

## 2013-09-15 NOTE — Telephone Encounter (Signed)
Last 2 days gained 7 pounds and has been eating a lot of ham. She thinks she is retaining fluid. She is taking HCTZ 25 mg daily.  She states her chest feels funny and hurt a few days ago.  Advised pt to come into the ED to be seen.      Aundra Dubin MD

## 2013-09-16 ENCOUNTER — Observation Stay (HOSPITAL_COMMUNITY): Payer: Medicare Other

## 2013-09-16 ENCOUNTER — Observation Stay (HOSPITAL_COMMUNITY)
Admission: EM | Admit: 2013-09-16 | Discharge: 2013-09-16 | Disposition: A | Discharge status: Home / Self Care | Payer: Medicare Other | Attending: Internal Medicine | Admitting: Internal Medicine

## 2013-09-16 DIAGNOSIS — I10 Essential (primary) hypertension: Secondary | ICD-10-CM | POA: Diagnosis present

## 2013-09-16 DIAGNOSIS — I1A0 Resistant hypertension: Secondary | ICD-10-CM | POA: Diagnosis present

## 2013-09-16 DIAGNOSIS — R059 Cough, unspecified: Secondary | ICD-10-CM

## 2013-09-16 DIAGNOSIS — I509 Heart failure, unspecified: Secondary | ICD-10-CM

## 2013-09-16 DIAGNOSIS — F259 Schizoaffective disorder, unspecified: Secondary | ICD-10-CM

## 2013-09-16 DIAGNOSIS — R05 Cough: Secondary | ICD-10-CM

## 2013-09-16 DIAGNOSIS — K219 Gastro-esophageal reflux disease without esophagitis: Secondary | ICD-10-CM | POA: Diagnosis present

## 2013-09-16 DIAGNOSIS — F17200 Nicotine dependence, unspecified, uncomplicated: Secondary | ICD-10-CM | POA: Diagnosis present

## 2013-09-16 DIAGNOSIS — R079 Chest pain, unspecified: Secondary | ICD-10-CM | POA: Diagnosis present

## 2013-09-16 DIAGNOSIS — E876 Hypokalemia: Secondary | ICD-10-CM | POA: Diagnosis present

## 2013-09-16 DIAGNOSIS — E785 Hyperlipidemia, unspecified: Secondary | ICD-10-CM | POA: Diagnosis present

## 2013-09-16 DIAGNOSIS — E119 Type 2 diabetes mellitus without complications: Secondary | ICD-10-CM | POA: Diagnosis present

## 2013-09-16 LAB — COMPREHENSIVE METABOLIC PANEL
ALT: 10 U/L (ref 0–35)
AST: 14 U/L (ref 0–37)
Albumin: 3.4 g/dL — ABNORMAL LOW (ref 3.5–5.2)
Alkaline Phosphatase: 92 U/L (ref 39–117)
BUN: 12 mg/dL (ref 6–23)
CHLORIDE: 99 meq/L (ref 96–112)
CO2: 24 meq/L (ref 19–32)
Calcium: 9.6 mg/dL (ref 8.4–10.5)
Creatinine, Ser: 0.8 mg/dL (ref 0.50–1.10)
GFR calc Af Amer: >90 mL/min (ref >90)
GFR, EST NON AFRICAN AMERICAN: 88 mL/min — AB (ref >90)
Glucose, Bld: 219 mg/dL — ABNORMAL HIGH (ref 70–99)
Potassium: 3.5 mEq/L — ABNORMAL LOW (ref 3.7–5.3)
Sodium: 136 mEq/L — ABNORMAL LOW (ref 137–147)
Total Bilirubin: <0.2 mg/dL — ABNORMAL LOW (ref 0.3–1.2)
Total Protein: 6.7 g/dL (ref 6.0–8.3)

## 2013-09-16 LAB — CBC WITH DIFFERENTIAL/PLATELET
BASOS ABS: 0 10*3/uL (ref 0.0–0.1)
Basophils Relative: 0 % (ref 0–1)
Eosinophils Absolute: 0.2 10*3/uL (ref 0.0–0.7)
Eosinophils Relative: 3 % (ref 0–5)
HCT: 34.4 % — ABNORMAL LOW (ref 36.0–46.0)
Hemoglobin: 11.2 g/dL — ABNORMAL LOW (ref 12.0–15.0)
LYMPHS PCT: 35 % (ref 12–46)
Lymphs Abs: 3 10*3/uL (ref 0.7–4.0)
MCH: 25.6 pg — ABNORMAL LOW (ref 26.0–34.0)
MCHC: 32.6 g/dL (ref 30.0–36.0)
MCV: 78.5 fL (ref 78.0–100.0)
Monocytes Absolute: 0.4 10*3/uL (ref 0.1–1.0)
Monocytes Relative: 5 % (ref 3–12)
NEUTROS ABS: 4.9 10*3/uL (ref 1.7–7.7)
NEUTROS PCT: 57 % (ref 43–77)
Platelets: 253 10*3/uL (ref 150–400)
RBC: 4.38 MIL/uL (ref 3.87–5.11)
RDW: 16.8 % — AB (ref 11.5–15.5)
WBC: 8.6 10*3/uL (ref 4.0–10.5)

## 2013-09-16 LAB — TROPONIN I
Troponin I: <0.3 ng/mL (ref <0.30)
Troponin I: <0.3 ng/mL (ref <0.30)

## 2013-09-16 LAB — GLUCOSE, CAPILLARY
GLUCOSE-CAPILLARY: 206 mg/dL — AB (ref 70–99)
GLUCOSE-CAPILLARY: 270 mg/dL — AB (ref 70–99)
Glucose-Capillary: 105 mg/dL — ABNORMAL HIGH (ref 70–99)
Glucose-Capillary: 121 mg/dL — ABNORMAL HIGH (ref 70–99)

## 2013-09-16 LAB — RAPID URINE DRUG SCREEN, HOSP PERFORMED
AMPHETAMINES: NOT DETECTED
BENZODIAZEPINES: NOT DETECTED
Barbiturates: NOT DETECTED
COCAINE: NOT DETECTED
Opiates: NOT DETECTED
Tetrahydrocannabinol: NOT DETECTED

## 2013-09-16 LAB — MAGNESIUM: Magnesium: 1.6 mg/dL (ref 1.5–2.5)

## 2013-09-16 LAB — PRO B NATRIURETIC PEPTIDE: PRO B NATRI PEPTIDE: 75.6 pg/mL (ref 0–125)

## 2013-09-16 LAB — BASIC METABOLIC PANEL
BUN: 11 mg/dL (ref 6–23)
CALCIUM: 9.5 mg/dL (ref 8.4–10.5)
CO2: 25 mEq/L (ref 19–32)
CREATININE: 0.79 mg/dL (ref 0.50–1.10)
Chloride: 101 mEq/L (ref 96–112)
GFR calc Af Amer: >90 mL/min (ref >90)
GFR calc non Af Amer: >90 mL/min (ref >90)
GLUCOSE: 111 mg/dL — AB (ref 70–99)
Potassium: 3.7 mEq/L (ref 3.7–5.3)
Sodium: 138 mEq/L (ref 137–147)

## 2013-09-16 LAB — I-STAT TROPONIN, ED: Troponin i, poc: 0 ng/mL (ref 0.00–0.08)

## 2013-09-16 MED ORDER — METOPROLOL TARTRATE 50 MG PO TABS
50.0000 mg | ORAL_TABLET | Freq: Two times a day (BID) | ORAL | Status: DC
  Administered 2013-09-16: 50 mg via ORAL
  Filled 2013-09-16 (×2): qty 1

## 2013-09-16 MED ORDER — POTASSIUM CHLORIDE CRYS ER 20 MEQ PO TBCR
40.0000 meq | EXTENDED_RELEASE_TABLET | Freq: Once | ORAL | Status: AC
  Administered 2013-09-16: 40 meq via ORAL
  Filled 2013-09-16: qty 2

## 2013-09-16 MED ORDER — SODIUM CHLORIDE 0.9 % IJ SOLN: 3.0000 mL | INTRAMUSCULAR | Status: DC | PRN

## 2013-09-16 MED ORDER — HYDROXYZINE HCL 10 MG PO TABS
10.0000 mg | ORAL_TABLET | Freq: Three times a day (TID) | ORAL | Status: DC | PRN
  Filled 2013-09-16: qty 1

## 2013-09-16 MED ORDER — INSULIN GLARGINE 100 UNIT/ML SOLOSTAR PEN: 24.0000 [IU] | PEN_INJECTOR | Freq: Every day | SUBCUTANEOUS | Status: DC

## 2013-09-16 MED ORDER — SIMVASTATIN 10 MG PO TABS
10.0000 mg | ORAL_TABLET | Freq: Every day | ORAL | Status: DC
  Administered 2013-09-16: 10 mg via ORAL
  Filled 2013-09-16: qty 1

## 2013-09-16 MED ORDER — NICOTINE 21 MG/24HR TD PT24
21.0000 mg | MEDICATED_PATCH | Freq: Every day | TRANSDERMAL | Status: DC
  Administered 2013-09-16: 21 mg via TRANSDERMAL
  Filled 2013-09-16: qty 1

## 2013-09-16 MED ORDER — SODIUM CHLORIDE 0.9 % IV SOLN: 250.0000 mL | INTRAVENOUS | Status: DC | PRN

## 2013-09-16 MED ORDER — REGADENOSON 0.4 MG/5ML IV SOLN
INTRAVENOUS | Status: AC
  Administered 2013-09-16: 0.4 mg
  Filled 2013-09-16: qty 5

## 2013-09-16 MED ORDER — VITAMIN C 500 MG PO TABS
1000.0000 mg | ORAL_TABLET | Freq: Every day | ORAL | Status: DC
  Administered 2013-09-16: 1000 mg via ORAL
  Filled 2013-09-16: qty 2

## 2013-09-16 MED ORDER — KETOROLAC TROMETHAMINE 30 MG/ML IJ SOLN: 30.0000 mg | Freq: Once | INTRAMUSCULAR | Status: DC

## 2013-09-16 MED ORDER — HYDROCHLOROTHIAZIDE 12.5 MG PO CAPS
12.5000 mg | ORAL_CAPSULE | Freq: Every day | ORAL | Status: DC
  Filled 2013-09-16: qty 1

## 2013-09-16 MED ORDER — ZIPRASIDONE HCL 80 MG PO CAPS
80.0000 mg | ORAL_CAPSULE | Freq: Two times a day (BID) | ORAL | Status: DC
  Administered 2013-09-16: 80 mg via ORAL
  Filled 2013-09-16 (×3): qty 1

## 2013-09-16 MED ORDER — ENOXAPARIN SODIUM 40 MG/0.4ML ~~LOC~~ SOLN
40.0000 mg | SUBCUTANEOUS | Status: DC
  Administered 2013-09-16: 40 mg via SUBCUTANEOUS
  Filled 2013-09-16: qty 0.4

## 2013-09-16 MED ORDER — SODIUM CHLORIDE 0.9 % IJ SOLN: 3.0000 mL | Freq: Two times a day (BID) | INTRAMUSCULAR | Status: DC

## 2013-09-16 MED ORDER — LORATADINE 10 MG PO TABS
10.0000 mg | ORAL_TABLET | Freq: Every day | ORAL | Status: DC
Start: 2013-09-16 — End: 2013-09-16
  Administered 2013-09-16: 10 mg via ORAL
  Filled 2013-09-16: qty 1

## 2013-09-16 MED ORDER — ASPIRIN EC 81 MG PO TBEC: 81.0000 mg | DELAYED_RELEASE_TABLET | Freq: Every day | ORAL | Status: DC

## 2013-09-16 MED ORDER — TECHNETIUM TC 99M SESTAMIBI GENERIC - CARDIOLITE
10.0000 | Freq: Once | INTRAVENOUS | Status: AC | PRN
  Administered 2013-09-16: 10 via INTRAVENOUS

## 2013-09-16 MED ORDER — POTASSIUM CHLORIDE CRYS ER 20 MEQ PO TBCR
20.0000 meq | EXTENDED_RELEASE_TABLET | Freq: Once | ORAL | Status: AC
  Administered 2013-09-16: 20 meq via ORAL
  Filled 2013-09-16: qty 1

## 2013-09-16 MED ORDER — ASPIRIN 325 MG PO TABS
325.0000 mg | ORAL_TABLET | Freq: Once | ORAL | Status: AC
  Administered 2013-09-16: 325 mg via ORAL
  Filled 2013-09-16: qty 1

## 2013-09-16 MED ORDER — IBUPROFEN 400 MG PO TABS
400.0000 mg | ORAL_TABLET | Freq: Three times a day (TID) | ORAL | Status: DC | PRN
  Filled 2013-09-16: qty 1

## 2013-09-16 MED ORDER — POTASSIUM CHLORIDE CRYS ER 20 MEQ PO TBCR: 40.0000 meq | EXTENDED_RELEASE_TABLET | Freq: Once | ORAL | Status: AC

## 2013-09-16 MED ORDER — INSULIN ASPART 100 UNIT/ML ~~LOC~~ SOLN
0.0000 [IU] | Freq: Three times a day (TID) | SUBCUTANEOUS | Status: DC
Start: 2013-09-16 — End: 2013-09-16
  Administered 2013-09-16: 5 [IU] via SUBCUTANEOUS

## 2013-09-16 MED ORDER — TECHNETIUM TC 99M SESTAMIBI GENERIC - CARDIOLITE
30.0000 | Freq: Once | INTRAVENOUS | Status: AC | PRN
  Administered 2013-09-16: 30 via INTRAVENOUS

## 2013-09-16 MED ORDER — NICOTINE 14 MG/24HR TD PT24: 14.0000 mg | MEDICATED_PATCH | Freq: Every day | TRANSDERMAL | Status: DC

## 2013-09-16 MED ORDER — PANTOPRAZOLE SODIUM 40 MG PO TBEC
40.0000 mg | DELAYED_RELEASE_TABLET | Freq: Every day | ORAL | Status: DC
  Administered 2013-09-16: 40 mg via ORAL

## 2013-09-16 MED ORDER — ALBUTEROL SULFATE (2.5 MG/3ML) 0.083% IN NEBU: 3.0000 mL | INHALATION_SOLUTION | Freq: Three times a day (TID) | RESPIRATORY_TRACT | Status: DC | PRN

## 2013-09-16 MED ORDER — SODIUM CHLORIDE 0.9 % IJ SOLN
3.0000 mL | Freq: Two times a day (BID) | INTRAMUSCULAR | Status: DC
Start: 2013-09-16 — End: 2013-09-16
  Administered 2013-09-16: 3 mL via INTRAVENOUS

## 2013-09-16 MED ORDER — INSULIN GLARGINE 100 UNIT/ML ~~LOC~~ SOLN
24.0000 [IU] | Freq: Every day | SUBCUTANEOUS | Status: DC
Start: 2013-09-16 — End: 2013-09-16
  Filled 2013-09-16: qty 0.24

## 2013-09-16 MED ORDER — GUAIFENESIN-DM 100-10 MG/5ML PO SYRP
5.0000 mL | ORAL_SOLUTION | ORAL | Status: DC | PRN
  Filled 2013-09-16: qty 5

## 2013-09-16 MED ORDER — MECLIZINE HCL 25 MG PO TABS
50.0000 mg | ORAL_TABLET | Freq: Three times a day (TID) | ORAL | Status: DC | PRN
  Administered 2013-09-16: 50 mg via ORAL
  Filled 2013-09-16 (×2): qty 2

## 2013-09-16 MED ORDER — LISINOPRIL 5 MG PO TABS
5.0000 mg | ORAL_TABLET | Freq: Every day | ORAL | Status: DC
  Administered 2013-09-16: 5 mg via ORAL
  Filled 2013-09-16: qty 1

## 2013-09-16 MED ORDER — LISINOPRIL 5 MG PO TABS: 5.0000 mg | ORAL_TABLET | Freq: Every day | ORAL | Status: DC

## 2013-09-16 NOTE — Progress Notes (Signed)
Chest pain rounds:  Patient was seen on rounds.  She has been experiencing chest discomfort worse since [facility] Day.  The pain is somewhat atypical.  At times it is a heavy feeling and at other times it is sharp in the left chest.  Her electrocardiogram shows nonspecific T-wave flattening.  She is mildly hypokalemic and this is being repleted.  Her initial enzymes are negative. On exam her lungs are clear.  The heart reveals no murmur gallop rub or click. She has multiple risk factors for ischemic heart disease including hypertension hyperlipidemia and diabetes mellitus. We will proceed with a Lexi scan Myoview stress test.  If the test is normal she may be able to be discharged later today.

## 2013-09-16 NOTE — Progress Notes (Signed)
Subjective: Pt feeling better today. Chest pain has improved and is very mild now. She reports the pain is intermittent but is unable to suggest what causes the pain to worsen--not worse with deep breathing, movement of her arm or shoulder, exertion. Denies any recent weight gain. No SOB. Cough has been present for 3 weeks and is improving. Finished course of azithromycin given to her by ED.  Objective: Vital signs in last 24 hours: Filed Vitals:   09/16/13 0415 09/16/13 0445 09/16/13 0515 09/16/13 0607  BP: 138/70 125/70 127/78 130/80  Pulse: 80 79 78 70  Temp:    97.6 F (36.4 C)  TempSrc:    Oral  Resp: 17  24 18   Height:    5\' 4"  (1.626 m)  Weight:    216 lb 1.6 oz (98.022 kg)  SpO2: 97% 98% 97% 99%   Weight change:  No intake or output data in the 24 hours ending 09/16/13 0909  Physical Exam General: alert, cooperative, and in no apparent distress HEENT: vision grossly intact Neck: supple Lungs: clear to ascultation bilaterally, normal work of respiration, no wheezes, rales, ronchi Heart: regular rate and rhythm, no murmurs, gallops, or rubs Abdomen: soft, non-tender, non-distended, normal bowel sounds Extremities: no pedal edema; BLE warm to touch Neurologic: alert & oriented X3, cranial nerves II-XII grossly intact, moves all extremities appropriately  Lab Results: Basic Metabolic Panel:  Recent Labs Lab 09/15/13 2334 09/16/13 0603  NA 136* 138  K 3.5* 3.7  CL 99 101  CO2 24 25  GLUCOSE 219* 111*  BUN 12 11  CREATININE 0.80 0.79  CALCIUM 9.6 9.5  MG  --  1.6   Liver Function Tests:  Recent Labs Lab 09/15/13 2334  AST 14  ALT 10  ALKPHOS 92  BILITOT <0.2*  PROT 6.7  ALBUMIN 3.4*   CBC:  Recent Labs Lab 09/15/13 2334  WBC 8.6  NEUTROABS 4.9  HGB 11.2*  HCT 34.4*  MCV 78.5  PLT 253   Cardiac Enzymes:  Recent Labs Lab 09/16/13 0603  TROPONINI <0.30    BNP:  Recent Labs Lab 09/16/13 0205  PROBNP 75.6   CBG:  Recent  Labs Lab 09/16/13 0618 09/16/13 0733  GLUCAP 105* 121*   Urine Drug Screen: Drugs of Abuse     Component Value Date/Time   LABOPIA NONE DETECTED 09/16/2013 0450   COCAINSCRNUR NONE DETECTED 09/16/2013 0450   COCAINSCRNUR NEG 08/10/2011 1103   LABBENZ NONE DETECTED 09/16/2013 0450   LABBENZ NEG 08/10/2011 1103   AMPHETMU NONE DETECTED 09/16/2013 0450   AMPHETMU NEG 08/10/2011 1103   THCU NONE DETECTED 09/16/2013 0450   LABBARB NONE DETECTED 09/16/2013 0450     Studies/Results: Dg Chest 2 View  09/15/2013   CLINICAL DATA:  Mid chest pain and cough.  EXAM: CHEST  2 VIEW  COMPARISON:  None.  FINDINGS: Heart, mediastinum and hila are unremarkable. There is mild peribronchial streaky opacity in the right lower lobe, stable. Lungs are otherwise clear. No pleural effusion. No pneumothorax.  Bony thorax is intact.  IMPRESSION: No active cardiopulmonary disease.   Electronically Signed   By: Lajean Manes M.D.   On: 09/15/2013 23:55   Medications: I have reviewed the patient's current medications. Scheduled Meds: . [START ON 09/17/2013] aspirin EC  81 mg Oral Daily  . enoxaparin (LOVENOX) injection  40 mg Subcutaneous Q24H  . hydrochlorothiazide  12.5 mg Oral Daily  . insulin aspart  0-9 Units Subcutaneous TID WC  . insulin  glargine  24 Units Subcutaneous QHS  . ketorolac  30 mg Intravenous Once  . loratadine  10 mg Oral Daily  . metoprolol  50 mg Oral BID  . nicotine  21 mg Transdermal Daily  . pantoprazole  40 mg Oral Daily  . simvastatin  10 mg Oral q1800  . sodium chloride  3 mL Intravenous Q12H  . sodium chloride  3 mL Intravenous Q12H  . vitamin C  1,000 mg Oral Daily  . ziprasidone  80 mg Oral BID WC   Continuous Infusions:  PRN Meds:.sodium chloride, albuterol, guaiFENesin-dextromethorphan, hydrOXYzine, ibuprofen, meclizine, sodium chloride  Assessment/Plan:  Atypical Chest Pain - Chest pain is improved after toradol. Most likely MSK in origin given cough x 3 weeks and she is TTP on exam  on left chest wall.  However, she has multiple risk factors for ischemic heart disease (DM, HTN, HLD)--cardiology saw patient this morning and plan to do myoview stress test today. POC trop in ED negative and our first troponin negative as well. Pt may be able to go home if stress test is negative. Of note, UDS negative. CXR wnl.  -stress myoview today -ASA 81mg  daily -continue PPI, statin  Cough- Likely due to resovling URI. Patient s/p z-pak. Plan to treat symptomatically with Robitussin DM. CXR wnl, so not likely PNA.  Chronic dCHF (grade II) -Patient does not appear to be volume overloaded on exam. No BLE edema, lungs CTAB. proBNP 75.  - continue home HCTZ 12.5 mg   Hypokalemia- K 3.5 on admission. After Kdur 82mEq x 1, repeat K 3.7. Will give another Kdur 101meq today. Hypokalemia likely related to HCTZ, so will stop this medication and start lisinopril - trend BMP   DM II- Pt's last A1c 7.9% 08/2013. She is on lantus 24U qHS and metformin 1000mg  BID at home.  -Continue home lantus -SSI  Schizoaffective D/O -Appears stable. -continue home geodon 80 mg BID   HTN - Normotensive.  -will discontinue HCTZ given may be contributing to hypokalemia -continue lisinopril 5mg  daily -continue lopressor 50 mg BID  GERD- Appears stable. Plan to continue home PPI.  DVT: Lovenox  Diet: Carb Mod  Dispo: Disposition is deferred at this time, awaiting improvement of current medical problems.  Anticipated discharge in approximately 1 day(s).   The patient does have a current PCP (Neema Bobbie Stack, MD) and does need an [facility] hospital follow-up appointment after discharge.  The patient does not have transportation limitations that hinder transportation to clinic appointments.  .Services Needed at time of discharge: Y = Yes, Blank = No PT:   OT:   RN:   Equipment:   Other:     LOS: 0 days   Rebecca Eaton, MD 09/16/2013, 9:09 AM

## 2013-09-16 NOTE — H&P (Signed)
Pt seen and examined, In brief, patient is a 48 year old woman with PMH of DM, HLD, HTN, GERD, anemia, schizoaffective disorder, Chronic diastolic CHF who presents with CP * 1 week. Pt states that she developed chest pain approx 1 week ago- pressure like, non radiating, left sided, sharp, no relation to breathing/movement but reproducible on palpation. Patient was recently seen in [facility] for a cough and also went to the ED for similar complaints and received Z PAK. She completed 5 days of it with no significant improvement. No fevers, no HA, no sob, no palpitations, no diaphoresis, no abd pain, no n/v. Remaining ROS negative  Physical exam: BP 126/67, pulse 78, resp- 18, temp 97.6 Cardio- RRR, normal heart sounds Pulm- CTA b/l Abd- soft, obese, non tender, BS + Ext - no pedal edema Gen- AAO*3, NAD  Assessment and Plan:  Chest pain - unlikely cardiac.Check troponins * 3 sets - Cardio f/u noted. Patient for stress test today - If normal stress test will consider d/c home - EKG with no acute ST/T wave changes - c/w asa, metoprolol, statin - CP likely MSK in nature. Will continue with ibuprofen prn for now  Likely URI - cough is slowly improving. Will monitor off abx  DM - CBG stable. Will monitor on current regimen  HTN - c/w lisinopril, metoprolol  Schizoaffective disorder - c/w current meds  Case d/w patient and residents in detail

## 2013-09-16 NOTE — Progress Notes (Signed)
Lexiscan completed.  No acute issues.  Tarri Fuller PA-C

## 2013-09-16 NOTE — Discharge Summary (Signed)
Name: [name] MRN: [id number] DOB: 01/24/1966 48 y.o. PCP: Othella Boyer, MD  Date of Admission: 09/16/2013  1:21 AM Date of Discharge: 09/16/2013 Attending Physician: Aldine Contes, MD  Discharge Diagnosis: Principal Problem:   Chest pain- Atypical, likely MSK from cough Active Problems:   Diabetes mellitus type II, controlled   HYPERLIPIDEMIA   TOBACCO ABUSE   HYPERTENSION   GERD (gastroesophageal reflux disease)   Viral URI w/ cough  Discharge Medications:   Medication List    STOP taking these medications       azithromycin 250 MG tablet  Commonly known as:  ZITHROMAX Z-PAK      TAKE these medications       albuterol 108 (90 BASE) MCG/ACT inhaler  Commonly known as:  PROVENTIL HFA;VENTOLIN HFA  Inhale 2 puffs into the lungs 3 (three) times daily as needed for wheezing or shortness of breath.     esomeprazole 20 MG capsule  Commonly known as:  NEXIUM  Take 20 mg by mouth daily at 12 noon.     fexofenadine 60 MG tablet  Commonly known as:  ALLEGRA  Take 1 tablet (60 mg total) by mouth 2 (two) times daily.     guaiFENesin-dextromethorphan 100-10 MG/5ML syrup  Commonly known as:  ROBITUSSIN DM  Take 5 mLs by mouth every 4 (four) hours as needed for cough.     LANTUS SOLOSTAR 100 UNIT/ML Solostar Pen  Generic drug:  Insulin Glargine  Inject 24 Units into the skin at bedtime.     lisinopril 5 MG tablet  Commonly known as:  PRINIVIL,ZESTRIL  Take 1 tablet (5 mg total) by mouth daily.     loratadine-pseudoephedrine 10-240 MG per 24 hr tablet  Commonly known as:  CLARITIN-D 24-hour  Take 1 tablet by mouth daily as needed for allergies.     lovastatin 20 MG tablet  Commonly known as:  MEVACOR  Take 1 tablet (20 mg total) by mouth at bedtime.     meclizine 50 MG tablet  Commonly known as:  ANTIVERT  Take 50 mg by mouth 3 (three) times daily as needed for dizziness.     metFORMIN 1000 MG tablet  Commonly known as:  GLUCOPHAGE  Take 1,000 mg by  mouth 2 (two) times daily with a meal.     metoprolol 50 MG tablet  Commonly known as:  LOPRESSOR  Take 50 mg by mouth 2 (two) times daily.     vitamin C 1000 MG tablet  Take 1,000 mg by mouth daily.     ziprasidone 80 MG capsule  Commonly known as:  GEODON  Take 1 capsule (80 mg total) by mouth 2 (two) times daily with a meal.      ASK your doctor about these medications       hydrochlorothiazide 12.5 MG capsule  Commonly known as:  MICROZIDE  Take 25 mg by mouth daily.     hydrOXYzine 10 MG tablet  Commonly known as:  ATARAX/VISTARIL  Take 10 mg by mouth 3 (three) times daily as needed for anxiety.     ibuprofen 200 MG tablet  Commonly known as:  ADVIL,MOTRIN  Take 400 mg by mouth every 8 (eight) hours as needed. Aches and pains, fever       Disposition and follow-up:   [name] was discharged from [facility] in Stable condition.  At the hospital follow up visit please address:  1.  Assess for resolution of her chest pain  and cough 2.  Given her tendency toward hypokalemia we stopped her HCTZ and started low dose lisinopril- please check BP and adjust meds as needed   2.  Labs / imaging needed at time of follow-up: BMP (K)  3.  Pending labs/ test needing follow-up: none  Follow-up Appointments: Follow-up Information   Follow up with Juluis Mire, MD On 09/23/2013. (10:15am)    Specialty:  Internal Medicine   Contact information:   [place]  [address] [phone]       Discharge Instructions: Discharge Instructions   Call MD for:  persistant dizziness or light-headedness    Complete by:  As directed      Call MD for:  persistant nausea and vomiting    Complete by:  As directed      Call MD for:  severe uncontrolled pain    Complete by:  As directed      Diet - low sodium heart healthy    Complete by:  As directed      Increase activity slowly    Complete by:  As directed            Consultations:   cardiology   Procedures Performed:  Dg Chest 2 View  09/15/2013   CLINICAL DATA:  Mid chest pain and cough.  EXAM: CHEST  2 VIEW  COMPARISON:  None.  FINDINGS: Heart, mediastinum and hila are unremarkable. There is mild peribronchial streaky opacity in the right lower lobe, stable. Lungs are otherwise clear. No pleural effusion. No pneumothorax.  Bony thorax is intact.  IMPRESSION: No active cardiopulmonary disease.   Electronically Signed   By: Lajean Manes M.D.   On: 09/15/2013 23:55   Leane Call 09/16/2013 FINDINGS:  ECG: SR, normal ECG, no changes at stress  Symptoms: Mild SOB  RAW Data: Extracardiac activity not affecting ability to read.  Quantitiative Gated SPECT EF: LVEF=63%. No wall motion  abnormalities.  Perfusion Images: Normal uptake at rest and stress.  IMPRESSION:  Normal Lexiscan nuclear stress test. No scar or ischemia. Normal  LVEF 63%.   Admission HPI:  [name] is a 48 y.o. woman with a pmhx of HTN, HLD, schizoaffective disroder, CHF (grade II diastolic), DM II, and alcoholism who presents with a cc of chest pain. The patient was in her normal state of health until about a week ago when she developed chest pressure. This pressure has been slowly progressive. It became chest pain on 6/2. On ROS, she had been coughing and was felt to have an URI. She was seen in [facility] for this cough. She did not receive abx from [facility] so she went to the ED who prescribed her with a z-pak. The z-pak did not make much chnge in her cough. Her cough has slowly been resolving. The pain in primarily on the patients left side. It is located above her left breast. On ROS, she notes orthopnea that is at baseline. She states that she has gained 7 lbs since her clinic visit. She admits to eating foods high in salt. She denies LE swelling. Denies fevers, chills, nase[place] vomiting.  Hospital Course by problem list: Atypical Chest Pain-The chest pain is atypical as it is reproducible with palpation  and not associated with exertion. Suspect MSK origin given recent cough. Chest pain improved after toradol. EKG showed some T wave flattening in the inferiolateral leads, which was unchanged from prior EKGs. CXR wnl. She has multiple risk factors for ischemic heart disease (DM, HTN,  HLD). Troponin negative x 2. Cardiology saw patient and ordered myoview stress test, which showed no reversible ischemia or sign of prior infarct. Of note, UDS negative. ASA 325mg  given x 1 and she was maintained on ASA 81mg  daily (did not continue this at discharge). Home PPI was continued.  Cough- Patient with URI type symptoms x 3 weeks that are improving. Cough may be reason for CP. Patient s/p z-pak. Treated symptomatically with Robitussin DM. CXR wnl, so not likely PNA.  Chronic dCHF (grade II)- Patient does not appear to be volume overloaded. She has stable orthopnea. There are no rales on PE. There is no LE edema. CX is clear and proBNP is less than 100. She denies recent weight gain.  continue home HCTZ 12.5 mg   Hypokalemia- K 3.5 on admission. After Kdur 22mEq x 1, repeat K 3.7. Gave another Kdur 37meq prior to discharge. Hypokalemia likely related to HCTZ, so stopped this medication and started lisinopril 5mg  daily. Please recheck BMP at hospital follow up appointment.   DM II- Pt's last A1c 7.9% 08/2013. She is on lantus 24U qHS and metformin 1000mg  BID at home. Her home lantus was continued and she was managed on SSI. CBGs remained stable. Discharged on her home regimen.  Schizoaffective D/O- Stable, continued home geodon 80 mg BID.  HTN- BP stable. Continued HCTZ 12.5mg  daily and lopressor 50 mg BID on admission. However, discontinued HCTZ given may be contributing to hypokalemia. Started lisinopril 5mg  daily (given rx on discharge).   GERD- Stable. Continued home nexium.   Discharge Vitals:   BP 141/84  Pulse 78  Temp(Src) 97.6 F (36.4 C) (Oral)  Resp 18  Ht 5\' 4"  (1.626 m)  Wt 216 lb 1.6 oz (98.022  kg)  BMI 37.08 kg/m2  SpO2 99%  LMP 08/24/2013  Discharge Labs:  Results for orders placed during the hospital encounter of 09/16/13 (from the past 24 hour(s))  CBC WITH DIFFERENTIAL     Status: Abnormal   Collection Time    09/15/13 11:34 PM      Result Value Ref Range   WBC 8.6  4.0 - 10.5 K/uL   RBC 4.38  3.87 - 5.11 MIL/uL   Hemoglobin 11.2 (*) 12.0 - 15.0 g/dL   HCT 34.4 (*) 36.0 - 46.0 %   MCV 78.5  78.0 - 100.0 fL   MCH 25.6 (*) 26.0 - 34.0 pg   MCHC 32.6  30.0 - 36.0 g/dL   RDW 16.8 (*) 11.5 - 15.5 %   Platelets 253  150 - 400 K/uL   Neutrophils Relative % 57  43 - 77 %   Neutro Abs 4.9  1.7 - 7.7 K/uL   Lymphocytes Relative 35  12 - 46 %   Lymphs Abs 3.0  0.7 - 4.0 K/uL   Monocytes Relative 5  3 - 12 %   Monocytes Absolute 0.4  0.1 - 1.0 K/uL   Eosinophils Relative 3  0 - 5 %   Eosinophils Absolute 0.2  0.0 - 0.7 K/uL   Basophils Relative 0  0 - 1 %   Basophils Absolute 0.0  0.0 - 0.1 K/uL  COMPREHENSIVE METABOLIC PANEL     Status: Abnormal   Collection Time    09/15/13 11:34 PM      Result Value Ref Range   Sodium 136 (*) 137 - 147 mEq/L   Potassium 3.5 (*) 3.7 - 5.3 mEq/L   Chloride 99  96 - 112 mEq/L   CO2 24  19 - 32 mEq/L   Glucose, Bld 219 (*) 70 - 99 mg/dL   BUN 12  6 - 23 mg/dL   Creatinine, Ser 0.80  0.50 - 1.10 mg/dL   Calcium 9.6  8.4 - 10.5 mg/dL   Total Protein 6.7  6.0 - 8.3 g/dL   Albumin 3.4 (*) 3.5 - 5.2 g/dL   AST 14  0 - 37 U/L   ALT 10  0 - 35 U/L   Alkaline Phosphatase 92  39 - 117 U/L   Total Bilirubin <0.2 (*) 0.3 - 1.2 mg/dL   GFR calc non Af Amer 88 (*) >90 mL/min   GFR calc Af Amer >90  >90 mL/min  I-STAT TROPOININ, ED     Status: None   Collection Time    09/16/13 12:14 AM      Result Value Ref Range   Troponin i, poc 0.00  0.00 - 0.08 ng/mL   Comment 3           PRO B NATRIURETIC PEPTIDE     Status: None   Collection Time    09/16/13  2:05 AM      Result Value Ref Range   Pro B Natriuretic peptide (BNP) 75.6  0 - 125  pg/mL  URINE RAPID DRUG SCREEN (HOSP PERFORMED)     Status: None   Collection Time    09/16/13  4:50 AM      Result Value Ref Range   Opiates NONE DETECTED  NONE DETECTED   Cocaine NONE DETECTED  NONE DETECTED   Benzodiazepines NONE DETECTED  NONE DETECTED   Amphetamines NONE DETECTED  NONE DETECTED   Tetrahydrocannabinol NONE DETECTED  NONE DETECTED   Barbiturates NONE DETECTED  NONE DETECTED  TROPONIN I     Status: None   Collection Time    09/16/13  6:03 AM      Result Value Ref Range   Troponin I <0.30  <0.30 ng/mL  BASIC METABOLIC PANEL     Status: Abnormal   Collection Time    09/16/13  6:03 AM      Result Value Ref Range   Sodium 138  137 - 147 mEq/L   Potassium 3.7  3.7 - 5.3 mEq/L   Chloride 101  96 - 112 mEq/L   CO2 25  19 - 32 mEq/L   Glucose, Bld 111 (*) 70 - 99 mg/dL   BUN 11  6 - 23 mg/dL   Creatinine, Ser 0.79  0.50 - 1.10 mg/dL   Calcium 9.5  8.4 - 10.5 mg/dL   GFR calc non Af Amer >90  >90 mL/min   GFR calc Af Amer >90  >90 mL/min  MAGNESIUM     Status: None   Collection Time    09/16/13  6:03 AM      Result Value Ref Range   Magnesium 1.6  1.5 - 2.5 mg/dL  GLUCOSE, CAPILLARY     Status: Abnormal   Collection Time    09/16/13  6:18 AM      Result Value Ref Range   Glucose-Capillary 105 (*) 70 - 99 mg/dL  GLUCOSE, CAPILLARY     Status: Abnormal   Collection Time    09/16/13  7:33 AM      Result Value Ref Range   Glucose-Capillary 121 (*) 70 - 99 mg/dL   Comment 1 Documented in Chart     Comment 2 Notify RN    TROPONIN I     Status: None  Collection Time    09/16/13  2:45 PM      Result Value Ref Range   Troponin I <0.30  <0.30 ng/mL  GLUCOSE, CAPILLARY     Status: Abnormal   Collection Time    09/16/13  3:04 PM      Result Value Ref Range   Glucose-Capillary 206 (*) 70 - 99 mg/dL  GLUCOSE, CAPILLARY     Status: Abnormal   Collection Time    09/16/13  4:34 PM      Result Value Ref Range   Glucose-Capillary 270 (*) 70 - 99 mg/dL   Comment 1  Documented in Chart     Comment 2 Notify RN      Signed: Rebecca Eaton, MD 09/16/2013, 5:53 PM   Time Spent on Discharge: 35 minutes Services Ordered on Discharge: none Equipment Ordered on Discharge: none

## 2013-09-16 NOTE — ED Notes (Signed)
Report to [facility] on 3W.  Pt to go to floor on monitor with RN accompanying.

## 2013-09-16 NOTE — H&P (Signed)
Date: 09/16/2013               Patient Name:  [name] MRN: [id number]  DOB: 1965-08-09 Age / Sex: 48 y.o., female   PCP: Othella Boyer, MD         Medical Service: Internal Medicine Teaching Service         Attending Physician: Dr. Hoy Morn, MD    First Contact: Dr. Rebecca Eaton, MD Pager: [phone]  Second Contact: Dr. Jessee Avers, MD Pager: [phone]       After Hours (After 5p/  First Contact Pager: [phone]  weekends / holidays): Second Contact Pager: [phone]   Chief Complaint: Chest Pain  History of Present Illness: [name] is a 48 y.o. woman with a pmhx of HTN, HLD, schizoaffective disroder, CHF (grade II diastolic), DM II, and alcoholism who presents with a cc of chest pain. The patient was in her normal state of health until about a week ago when she developed chest pressure. This pressure has been slowly progressive. It became chest pain on 6/2. On ROS, she had been coughing and was felt to have an URI. She was seen in [facility] for this cough. She did not receive abx from [facility] so she went to the ED who prescribed her with a z-pak. The z-pak did not make much chnge in her cough. Her cough has slowly been resolving. The pain in primarily on the patients left side. It is located above her left breast. On ROS, she notes orthopnea that is at baseline. She states that she has gained 7 lbs since her clinic visit. She admits to eating foods high in salt. She denies LE swelling. Denies fevers, chills, nase[place] vomiting.  Meds: No current facility-administered medications for this encounter.   Current Outpatient Prescriptions  Medication Sig Dispense Refill  . albuterol (PROVENTIL HFA;VENTOLIN HFA) 108 (90 BASE) MCG/ACT inhaler Inhale 2 puffs into the lungs 3 (three) times daily as needed for wheezing or shortness of breath.  1 Inhaler  2  . Ascorbic Acid (VITAMIN C) 1000 MG tablet Take 1,000 mg by mouth daily.      Marland Kitchen esomeprazole (NEXIUM) 20 MG capsule Take 20 mg  by mouth daily at 12 noon.      . fexofenadine (ALLEGRA) 60 MG tablet Take 1 tablet (60 mg total) by mouth 2 (two) times daily.  30 tablet  0  . hydrochlorothiazide (MICROZIDE) 12.5 MG capsule Take 25 mg by mouth daily.      . hydrOXYzine (ATARAX/VISTARIL) 10 MG tablet Take 10 mg by mouth 3 (three) times daily as needed for anxiety.      Marland Kitchen ibuprofen (ADVIL,MOTRIN) 200 MG tablet Take 400 mg by mouth every 8 (eight) hours as needed. Aches and pains, fever      . Insulin Glargine (LANTUS SOLOSTAR) 100 UNIT/ML SOPN Inject 24 Units into the skin at bedtime.       Marland Kitchen loratadine-pseudoephedrine (CLARITIN-D 24-HOUR) 10-240 MG per 24 hr tablet Take 1 tablet by mouth daily as needed for allergies.      Marland Kitchen lovastatin (MEVACOR) 20 MG tablet Take 1 tablet (20 mg total) by mouth at bedtime.  90 tablet  3  . meclizine (ANTIVERT) 50 MG tablet Take 50 mg by mouth 3 (three) times daily as needed for dizziness.      . metFORMIN (GLUCOPHAGE) 1000 MG tablet Take 1,000 mg by mouth 2 (two) times daily with a meal.      . metoprolol (  LOPRESSOR) 50 MG tablet Take 50 mg by mouth 2 (two) times daily.      . ziprasidone (GEODON) 80 MG capsule Take 1 capsule (80 mg total) by mouth 2 (two) times daily with a meal.  60 capsule  0  . azithromycin (ZITHROMAX Z-PAK) 250 MG tablet Take 1 tablet (250 mg total) by mouth daily. 500mg  PO day 1, then 250mg  PO days 205  6 tablet  0  . guaiFENesin-dextromethorphan (ROBITUSSIN DM) 100-10 MG/5ML syrup Take 5 mLs by mouth every 4 (four) hours as needed for cough.  118 mL  0    Allergies: Allergies as of 09/15/2013 - Review Complete 09/10/2013  Allergen Reaction Noted  . Metronidazole Shortness Of Breath and Swelling   . Orange Itching 03/27/2011  . Shrimp [shellfish allergy] Shortness Of Breath and Itching 03/27/2011  . Penicillins Hives and Swelling   . Sulfonamide derivatives Hives   . Glipizide Other (See Comments) 01/15/2011  . Sulfamethoxazole-trimethoprim Rash 02/27/2010   Past  Medical History  Diagnosis Date  . Arteriosclerotic cardiovascular disease (ASCVD)     Minimal at cath in [facility].stress nuclear study in 8/08 with nl EF; neg stress echo in 2010  . Diabetes mellitus, type 2 2000    Onset in 2000; no insulin  . Hyperlipidemia   . Hypertension `    during treatment with Geodon  . Gastroesophageal reflux disease     Schatzki's ring  . Anemia, iron deficiency   . Alcohol abuse   . Depression   . Community acquired pneumonia 01/03/10, 05/2010, 04/2012    2011; with pleural effusion-[facility] acute resp failure; intubated in Jan 2014 (HMPV pneumonia)  . Obesity   . Schizoaffective disorder     requiring multiple psychiatric admissions  . Dysphagia   . Diastolic dysfunction     grade 2 per echo 2011  . Pulmonary hypertension 05/02/2012    Patient needs repeat echo in 06/2012   . History of alcohol abuse 07/22/2007    Qualifier: Diagnosis of  By: Lenn Cal     Past Surgical History  Procedure Laterality Date  . Dilation and curettage, diagnostic / therapeutic  1992  . Esophagogastroduodenoscopy  09/16/08    Dr. Trevor Iha hiatal hernia/excoriations involving the cardia and mucosa consistent with trauma, antral erosions  of linear petechiae ? gastritis versus early gastric antral vascular  ectasia.Marland Kitchen biopsy showed reactive gastropathy. No H. pylori.  . Esophagogastroduodenoscopy  09/2007    Dr. Evalee Mutton ring, dilated to 61 French Maloney dilator, small hiatal hernia, antral erosions, biopsies reactive gastropathy.  Azzie Almas dilation  07/17/2011    [facility] sedation-->distal esophageal stricture s/p dilation, chronic gastritis, multiple ulcers in stomach. no h.pylori  . Colonoscopy  01/2006    internal hemorrhoids  . Colonoscopy  01/10/2012    Procedure: COLONOSCOPY;  Surgeon: Daneil Dolin, MD;  Location: [facility];  Service: Endoscopy;  Laterality: N/A;  entered cecum @ [phone] ; total cecal withdrawal time = 8 minutes   Family History    Problem Relation Age of Onset  . Colon cancer Other   . Hypertension Mother   . Stroke Father     deceased at age 65  . Heart disease Sister   . Anesthesia problems Neg Hx   . Hypotension Neg Hx   . Malignant hyperthermia Neg Hx   . Pseudochol deficiency Neg Hx    History   Social History  . Marital Status: Single    Spouse Name: N/A  Number of Children: N/A  . Years of Education: 12   Occupational History  . Disability   . UNEMPLOYED    Social History Main Topics  . Smoking status: Heavy Tobacco Smoker -- 2.00 packs/day for 30 years    Types: Cigarettes    Start date: 05/18/2012  . Smokeless tobacco: Never Used     Comment: Has almostquit totally.  . Alcohol Use: No  . Drug Use: No  . Sexual Activity: Not on file   Other Topics Concern  . 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  Physical Exam: Blood pressure 145/90, pulse 73, temperature 98.4 F (36.9 C), temperature source Oral, resp. rate 15, height 5\' 4"  (1.626 m), weight 214 lb 4 oz (97.183 kg), last menstrual period 09/02/2013, SpO2 100.00%. Physical Exam  Constitutional: She appears well-developed and well-nourished. No distress.  HENT:  Head: Normocephalic and atraumatic.  Mouth/Throat: Oropharynx is clear and moist.  Cardiovascular: Normal rate, regular rhythm, normal heart sounds and intact distal pulses.  Exam reveals no friction rub.   No murmur heard. Patient is tender to palpation in the mid to lateral portion of her left pectoral muscle. This reproduces her pain.   Pulmonary/Chest: Effort normal and breath sounds normal. No respiratory distress. She has no wheezes. She has no rales. She exhibits  tenderness.  Skin: She is not diaphoretic.     Lab results: Basic Metabolic Panel:  Recent Labs  09/15/13 2334  NA 136*  K 3.5*  CL 99  CO2 24  GLUCOSE 219*  BUN 12  CREATININE 0.80  CALCIUM 9.6   Liver Function Tests:  Recent Labs  09/15/13 2334  AST 14  ALT 10  ALKPHOS 92  BILITOT <0.2*  PROT 6.7  ALBUMIN 3.4*   CBC:  Recent Labs  09/15/13 2334  WBC 8.6  NEUTROABS 4.9  HGB 11.2*  HCT 34.4*  MCV 78.5  PLT 253   BNP:  Recent Labs  09/16/13 0205  PROBNP 75.6   Urine Drug Screen: Drugs of Abuse     Component Value Date/Time   LABOPIA POSITIVE* 03/22/2012 1343   COCAINSCRNUR NONE DETECTED 03/22/2012 1343   COCAINSCRNUR NEG 08/10/2011 1103   LABBENZ NONE DETECTED 03/22/2012 1343   LABBENZ NEG 08/10/2011 1103   AMPHETMU NONE DETECTED 03/22/2012 1343   AMPHETMU NEG 08/10/2011 1103   THCU NONE DETECTED 03/22/2012 1343   LABBARB NONE DETECTED 03/22/2012 1343   Imaging results:  Dg Chest 2 View  09/15/2013   CLINICAL DATA:  Mid chest pain and cough.  EXAM: CHEST  2 VIEW  COMPARISON:  None.  FINDINGS: Heart, mediastinum and hila are unremarkable. There is mild peribronchial streaky opacity in the right lower lobe, stable. Lungs are otherwise clear. No pleural effusion. No pneumothorax.  Bony thorax is intact.  IMPRESSION: No active cardiopulmonary disease.   Electronically Signed   By: Lajean Manes M.D.   On: 09/15/2013 23:55    Other results: EKG: nonspecific ST and T waves changes.  Assessment & Plan by Problem: Active Problems:   Diabetes mellitus type II, controlled   HYPERLIPIDEMIA   TOBACCO ABUSE   HYPERTENSION   GERD (gastroesophageal reflux disease)  Atypical Chest Pain  The chest pain is atypical as it is reproducible with palpation. Thus it is likely MSK. Initial trop is negative. Previous nuclear med stress test was also negative (April 2014). There is t wave flattening on inferiolaeral leads (new from last EKG), but was present of previous  EKGs.  - Plan to admit to obs and trend troponins x3 - repeat EKG in am - Will treat chest pain with toradol IV, followed by PO ibuprofen as Cr normal.  Cough Likely due to resovling URI. Patient is now s/p z-pak. Plan to treat symptomatically with Robitussin DM.  H/O CHF (grade II) Patient does not appear to be volume overloaded. She has stable orthopnea. There are no rales on PE. There is no LE edema. CX is clear and proBNP is less than 100. She reports a recent weight gain.  - continue home HCTZ 12.5 mg  Hypokalemia Repleted with 40 meq of PO potassium - trend BMP  DM II Appears stable. 24 U lantus qhs. Continue home lantus, and plan for SSI-S  Schizoaffective D/O Appears stable, plan to continue home geodon 80 mg BID  HTN Appears stable on admission. Plan to continue 12.5 mg HCTZ qd and lopressor 50 mg BID.  GERD Appears stable. Plan to continue home nexium.   Allergies Appears stable. Plan to continue home allegra and hold Claritin D.   DVT: Lovenox Diet: Carb Mod  Dispo: Disposition is deferred at this time, awaiting improvement of current medical problems. Anticipated discharge in approximately 1 day(s).   The patient does have a current PCP (Neema Bobbie Stack, MD) and does need an [facility] hospital follow-up appointment after discharge.  The patient does not have transportation limitations that hinder transportation to clinic appointments.  Signed: Marrion Coy, MD 09/16/2013, 3:17 AM

## 2013-09-16 NOTE — Progress Notes (Signed)
UR completed 

## 2013-09-16 NOTE — ED Notes (Signed)
MD at bedside. 

## 2013-09-16 NOTE — Discharge Instructions (Signed)
Chest Pain (Nonspecific) °It is often hard to give a specific diagnosis for the cause of chest pain. There is always a chance that your pain could be related to something serious, such as a heart attack or a blood clot in the lungs. You need to follow up with your caregiver for further evaluation. °CAUSES  °· Heartburn. °· Pneumonia or bronchitis. °· Anxiety or stress. °· Inflammation around your heart (pericarditis) or lung (pleuritis or pleurisy). °· A blood clot in the lung. °· A collapsed lung (pneumothorax). It can develop suddenly on its own (spontaneous pneumothorax) or from injury (trauma) to the chest. °· Shingles infection (herpes zoster virus). °The chest wall is composed of bones, muscles, and cartilage. Any of these can be the source of the pain. °· The bones can be bruised by injury. °· The muscles or cartilage can be strained by coughing or overwork. °· The cartilage can be affected by inflammation and become sore (costochondritis). °DIAGNOSIS  °Lab tests or other studies, such as X-rays, electrocardiography, stress testing, or cardiac imaging, may be needed to find the cause of your pain.  °TREATMENT  °· Treatment depends on what may be causing your chest pain. Treatment may include: °· Acid blockers for heartburn. °· Anti-inflammatory medicine. °· Pain medicine for inflammatory conditions. °· Antibiotics if an infection is present. °· You may be advised to change lifestyle habits. This includes stopping smoking and avoiding alcohol, caffeine, and chocolate. °· You may be advised to keep your head raised (elevated) when sleeping. This reduces the chance of acid going backward from your stomach into your esophagus. °· Most of the time, nonspecific chest pain will improve within 2 to 3 days with rest and mild pain medicine. °HOME CARE INSTRUCTIONS  °· If antibiotics were prescribed, take your antibiotics as directed. Finish them even if you start to feel better. °· For the next few days, avoid physical  activities that bring on chest pain. Continue physical activities as directed. °· Do not smoke. °· Avoid drinking alcohol. °· Only take over-the-counter or prescription medicine for pain, discomfort, or fever as directed by your caregiver. °· Follow your caregiver's suggestions for further testing if your chest pain does not go away. °· Keep any follow-up appointments you made. If you do not go to an appointment, you could develop lasting (chronic) problems with pain. If there is any problem keeping an appointment, you must call to reschedule. °SEEK MEDICAL CARE IF:  °· You think you are having problems from the medicine you are taking. Read your medicine instructions carefully. °· Your chest pain does not go away, even after treatment. °· You develop a rash with blisters on your chest. °SEEK IMMEDIATE MEDICAL CARE IF:  °· You have increased chest pain or pain that spreads to your arm, neck, jaw, back, or abdomen. °· You develop shortness of breath, an increasing cough, or you are coughing up blood. °· You have severe back or abdominal pain, feel nauseous, or vomit. °· You develop severe weakness, fainting, or chills. °· You have a fever. °THIS IS AN EMERGENCY. Do not wait to see if the pain will go away. Get medical help at once. Call your local emergency services (911 in [place] Do not drive yourself to the hospital. °MAKE SURE YOU:  °· Understand these instructions. °· Will watch your condition. °· Will get help right away if you are not doing well or get worse. °Document Released: 01/10/2005 Document Revised: 06/25/2011 Document Reviewed: 11/06/2007 °ExitCare® Patient Information ©2014 [employer]   [employer]. ° °

## 2013-09-16 NOTE — ED Provider Notes (Signed)
CSN: [id number]     Arrival date & time 09/15/13  2315 History   First MD Initiated Contact with Patient 09/16/13 0126     Chief Complaint  Patient presents with  . Chest Pain     The history is provided by the patient.   patient reports intermittent chest pressure with radiation to her bilateral arms with some associated shortness of breath and sweating.  This is been intermittent over the past week.  It's not always associated with exertion.  She does report approximately 7 pounds weight gain over the past several days.  No prior history of congestive heart failure.  She does report that she's sleeping at an incline position.  She has recently had an upper respiratory tract infection.  She's had several nuclear stress test in the past with the last one in 2014 demonstrating normal function and a nonischemic appearing perfusion study.  No prior history of heart catheterization.  Past Medical History  Diagnosis Date  . Arteriosclerotic cardiovascular disease (ASCVD)     Minimal at cath in [facility].stress nuclear study in 8/08 with nl EF; neg stress echo in 2010  . Diabetes mellitus, type 2 2000    Onset in 2000; no insulin  . Hyperlipidemia   . Hypertension `    during treatment with Geodon  . Gastroesophageal reflux disease     Schatzki's ring  . Anemia, iron deficiency   . Alcohol abuse   . Depression   . Community acquired pneumonia 01/03/10, 05/2010, 04/2012    2011; with pleural effusion-[facility] acute resp failure; intubated in Jan 2014 (HMPV pneumonia)  . Obesity   . Schizoaffective disorder     requiring multiple psychiatric admissions  . Dysphagia   . Diastolic dysfunction     grade 2 per echo 2011  . Pulmonary hypertension 05/02/2012    Patient needs repeat echo in 06/2012   . History of alcohol abuse 07/22/2007    Qualifier: Diagnosis of  By: Lenn Cal     Past Surgical History  Procedure Laterality Date  . Dilation and curettage, diagnostic / therapeutic   1992  . Esophagogastroduodenoscopy  09/16/08    Dr. Trevor Iha hiatal hernia/excoriations involving the cardia and mucosa consistent with trauma, antral erosions  of linear petechiae ? gastritis versus early gastric antral vascular  ectasia.Marland Kitchen biopsy showed reactive gastropathy. No H. pylori.  . Esophagogastroduodenoscopy  09/2007    Dr. Evalee Mutton ring, dilated to 24 French Maloney dilator, small hiatal hernia, antral erosions, biopsies reactive gastropathy.  Azzie Almas dilation  07/17/2011    [facility] sedation-->distal esophageal stricture s/p dilation, chronic gastritis, multiple ulcers in stomach. no h.pylori  . Colonoscopy  01/2006    internal hemorrhoids  . Colonoscopy  01/10/2012    Procedure: COLONOSCOPY;  Surgeon: Daneil Dolin, MD;  Location: [facility];  Service: Endoscopy;  Laterality: N/A;  entered cecum @ [phone] ; total cecal withdrawal time = 8 minutes   Family History  Problem Relation Age of Onset  . Colon cancer Other   . Hypertension Mother   . Stroke Father     deceased at age 107  . Heart disease Sister   . Anesthesia problems Neg Hx   . Hypotension Neg Hx   . Malignant hyperthermia Neg Hx   . Pseudochol deficiency Neg Hx    History  Substance Use Topics  . Smoking status: Heavy Tobacco Smoker -- 2.00 packs/day for 30 years    Types: Cigarettes    Start date:  05/18/2012  . Smokeless tobacco: Never Used     Comment: Has almostquit totally.  . Alcohol Use: No   OB History   Grav Para Term Preterm Abortions TAB SAB Ect Mult Living   3 2 2  1 1    2      Review of Systems  Cardiovascular: Positive for chest pain.  All other systems reviewed and are negative.     Allergies  Metronidazole; Orange; Shrimp; Penicillins; Sulfonamide derivatives; Glipizide; and Sulfamethoxazole-trimethoprim  Home Medications   Prior to Admission medications   Medication Sig Start Date End Date Taking? Authorizing Provider  albuterol (PROVENTIL HFA;VENTOLIN HFA) 108 (90 BASE)  MCG/ACT inhaler Inhale 2 puffs into the lungs 3 (three) times daily as needed for wheezing or shortness of breath. 10/27/12  Yes Rigoberto Noel, MD  Ascorbic Acid (VITAMIN C) 1000 MG tablet Take 1,000 mg by mouth daily.   Yes Historical Provider, MD  esomeprazole (NEXIUM) 20 MG capsule Take 20 mg by mouth daily at 12 noon.   Yes Historical Provider, MD  fexofenadine (ALLEGRA) 60 MG tablet Take 1 tablet (60 mg total) by mouth 2 (two) times daily. 09/10/13  Yes Johnna Acosta, MD  hydrochlorothiazide (MICROZIDE) 12.5 MG capsule Take 25 mg by mouth daily.   Yes Historical Provider, MD  hydrOXYzine (ATARAX/VISTARIL) 10 MG tablet Take 10 mg by mouth 3 (three) times daily as needed for anxiety.   Yes Historical Provider, MD  ibuprofen (ADVIL,MOTRIN) 200 MG tablet Take 400 mg by mouth every 8 (eight) hours as needed. Aches and pains, fever   Yes Historical Provider, MD  Insulin Glargine (LANTUS SOLOSTAR) 100 UNIT/ML SOPN Inject 24 Units into the skin at bedtime.    Yes Historical Provider, MD  loratadine-pseudoephedrine (CLARITIN-D 24-HOUR) 10-240 MG per 24 hr tablet Take 1 tablet by mouth daily as needed for allergies.   Yes Historical Provider, MD  lovastatin (MEVACOR) 20 MG tablet Take 1 tablet (20 mg total) by mouth at bedtime. 09/01/13  Yes Othella Boyer, MD  meclizine (ANTIVERT) 50 MG tablet Take 50 mg by mouth 3 (three) times daily as needed for dizziness.   Yes Historical Provider, MD  metFORMIN (GLUCOPHAGE) 1000 MG tablet Take 1,000 mg by mouth 2 (two) times daily with a meal.   Yes Historical Provider, MD  metoprolol (LOPRESSOR) 50 MG tablet Take 50 mg by mouth 2 (two) times daily.   Yes Historical Provider, MD  ziprasidone (GEODON) 80 MG capsule Take 1 capsule (80 mg total) by mouth 2 (two) times daily with a meal. 03/26/12  Yes Waylan Boga, NP  azithromycin (ZITHROMAX Z-PAK) 250 MG tablet Take 1 tablet (250 mg total) by mouth daily. 500mg  PO day 1, then 250mg  PO days 205 09/10/13   Johnna Acosta, MD   guaiFENesin-dextromethorphan [facility] DM) 100-10 MG/5ML syrup Take 5 mLs by mouth every 4 (four) hours as needed for cough. 09/04/13   Carter Kitten, MD   BP 130/75  Pulse 88  Temp(Src) 98.4 F (36.9 C) (Oral)  Resp 18  Ht 5\' 4"  (1.626 m)  Wt 214 lb 4 oz (97.183 kg)  BMI 36.76 kg/m2  SpO2 99%  LMP 09/02/2013 Physical Exam  Nursing note and vitals reviewed. Constitutional: She is oriented to person, place, and time. She appears well-developed and well-nourished. No distress.  HENT:  Head: Normocephalic and atraumatic.  Eyes: EOM are normal.  Neck: Normal range of motion.  Cardiovascular: Normal rate, regular rhythm and normal heart sounds.   Pulmonary/Chest:  Effort normal and breath sounds normal.  Abdominal: Soft. She exhibits no distension. There is no tenderness.  Musculoskeletal: Normal range of motion. She exhibits no edema.  Neurological: She is alert and oriented to person, place, and time.  Skin: Skin is warm and dry.  Psychiatric: She has a normal mood and affect. Judgment normal.    ED Course  Procedures (including critical care time) Labs Review Labs Reviewed  CBC WITH DIFFERENTIAL - Abnormal; Notable for the following:    Hemoglobin 11.2 (*)    HCT 34.4 (*)    MCH 25.6 (*)    RDW 16.8 (*)    All other components within normal limits  COMPREHENSIVE METABOLIC PANEL - Abnormal; Notable for the following:    Sodium 136 (*)    Potassium 3.5 (*)    Glucose, Bld 219 (*)    Albumin 3.4 (*)    Total Bilirubin <0.2 (*)    GFR calc non Af Amer 88 (*)    All other components within normal limits  PRO B NATRIURETIC PEPTIDE  I-STAT TROPOININ, ED    Imaging Review Dg Chest 2 View  09/15/2013   CLINICAL DATA:  Mid chest pain and cough.  EXAM: CHEST  2 VIEW  COMPARISON:  None.  FINDINGS: Heart, mediastinum and hila are unremarkable. There is mild peribronchial streaky opacity in the right lower lobe, stable. Lungs are otherwise clear. No pleural effusion. No  pneumothorax.  Bony thorax is intact.  IMPRESSION: No active cardiopulmonary disease.   Electronically Signed   By: Lajean Manes M.D.   On: 09/15/2013 23:55     EKG Interpretation   Date/Time:  Tuesday September 15 2013 23:24:37 EDT Ventricular Rate:  96 PR Interval:  130 QRS Duration: 72 QT Interval:  386 QTC Calculation: 487 R Axis:   77 Text Interpretation:  Normal sinus rhythm Right atrial enlargement Septal  infarct , age undetermined Abnormal ECG inferolateral T wave flattening  since prior ecg Confirmed by Raelee Rossmann  MD, Lennette Bihari ([id number]) on 09/16/2013 1:47:58  AM      MDM   Final diagnoses:  Chest pain    Patient be admitted the hospital for overnight observation and serial cardiac enzymes    Hoy Morn, MD 09/16/13 [phone]

## 2013-09-17 NOTE — Discharge Summary (Signed)
INTERNAL MEDICINE ATTENDING DISCHARGE COSIGN   I evaluated the patient on the day of discharge and discussed the discharge plan with my resident team. I agree with the discharge documentation and disposition.   [name] 09/17/2013, 3:03 PM

## 2013-09-22 ENCOUNTER — Telehealth: Payer: Self-pay | Admitting: *Deleted

## 2013-09-22 NOTE — Telephone Encounter (Signed)
Message left per pt - requesting rx for Nicotine patches. Stated she had tried them before and wants a stronger dosage if possible; smokes 1.5 PPD. Uses [employer]. Thanks

## 2013-09-23 ENCOUNTER — Ambulatory Visit (INDEPENDENT_AMBULATORY_CARE_PROVIDER_SITE_OTHER): Payer: Medicare Other | Admitting: Internal Medicine

## 2013-09-23 ENCOUNTER — Encounter: Payer: Self-pay | Admitting: Internal Medicine

## 2013-09-23 VITALS — BP 150/80 | HR 77 | Temp 98.6°F | Wt 220.6 lb

## 2013-09-23 DIAGNOSIS — E785 Hyperlipidemia, unspecified: Secondary | ICD-10-CM | POA: Diagnosis not present

## 2013-09-23 DIAGNOSIS — F17200 Nicotine dependence, unspecified, uncomplicated: Secondary | ICD-10-CM

## 2013-09-23 DIAGNOSIS — I2789 Other specified pulmonary heart diseases: Secondary | ICD-10-CM | POA: Diagnosis not present

## 2013-09-23 DIAGNOSIS — R42 Dizziness and giddiness: Secondary | ICD-10-CM | POA: Diagnosis not present

## 2013-09-23 DIAGNOSIS — I5030 Unspecified diastolic (congestive) heart failure: Secondary | ICD-10-CM | POA: Diagnosis not present

## 2013-09-23 DIAGNOSIS — E119 Type 2 diabetes mellitus without complications: Secondary | ICD-10-CM | POA: Diagnosis not present

## 2013-09-23 DIAGNOSIS — Z0000 Encounter for general adult medical examination without abnormal findings: Secondary | ICD-10-CM | POA: Diagnosis not present

## 2013-09-23 DIAGNOSIS — R635 Abnormal weight gain: Secondary | ICD-10-CM

## 2013-09-23 DIAGNOSIS — Z720 Tobacco use: Secondary | ICD-10-CM

## 2013-09-23 DIAGNOSIS — I10 Essential (primary) hypertension: Secondary | ICD-10-CM

## 2013-09-23 DIAGNOSIS — K219 Gastro-esophageal reflux disease without esophagitis: Secondary | ICD-10-CM | POA: Diagnosis not present

## 2013-09-23 LAB — TSH: TSH: 1.749 u[IU]/mL (ref 0.350–4.500)

## 2013-09-23 LAB — BASIC METABOLIC PANEL WITH GFR
BUN: 7 mg/dL (ref 6–23)
CALCIUM: 8.7 mg/dL (ref 8.4–10.5)
CO2: 23 mEq/L (ref 19–32)
Chloride: 107 mEq/L (ref 96–112)
Creat: 0.78 mg/dL (ref 0.50–1.10)
GFR, Est African American: >89 mL/min
Glucose, Bld: 149 mg/dL — ABNORMAL HIGH (ref 70–99)
Potassium: 4.1 mEq/L (ref 3.5–5.3)
SODIUM: 136 meq/L (ref 135–145)

## 2013-09-23 MED ORDER — FUROSEMIDE 20 MG PO TABS: 20.0000 mg | ORAL_TABLET | Freq: Every day | ORAL | Status: DC

## 2013-09-23 MED ORDER — NICOTINE 21 MG/24HR TD PT24: 21.0000 mg | MEDICATED_PATCH | Freq: Every day | TRANSDERMAL | Status: DC

## 2013-09-23 NOTE — Telephone Encounter (Signed)
21 mg patch sent in by Dr. Naaman Plummer during 09/23/13 visit.  This is the appropriate dose for the amt that she smokes.  Thanks.

## 2013-09-23 NOTE — Progress Notes (Signed)
Patient ID: [name], female   DOB: 1966/02/07, 48 y.o.   MRN: [id number]   Subjective:   Patient ID: [name] female   DOB: 11-04-1965 48 y.o.   MRN: [id number]  HPI: [name] is a 48 y.o. woman with past medical history of insulin-dependent Type II DM, hypertension, grade 2 diastolic dysfunction, chronic microcytic anemia, schizoaffective disorder, GERD, and tobacco abuse who presents for hospital follow-up visit.   She was hospitalized on 6/3 for chest pain thought to be due to musculoskeletal pain from cough from recent URI. She had workup including myoview stress test that was negative. Due to hypokalemia, her HCTZ 12.5 mg daily was discontinued and was instead started on lisinopril 5 mg daily.   She reports that since hospital discharge 1 week ago she has had no further chest pain and her URI has resolved. She states she gained 4 lb since discharge and before that had gained about 12 lb in April. She has noticed abdominal and LE swelling since stopping HCTZ. She denies dyspnea with exertion, orthopnea from baseline, or dietary indiscretion. She believes her dry weight is 207 lb.   He last A1c was 7.9 in May. She reports compliance with Lantus 24 U daily and metformin 1000 mg BID. She reports trying cinnamon recently with symptomatic hypoglycemic episode (CBG 50) due to not eating. She reports checking her glucose once daily with average glucose in 100's. She has baseline blurry vision and neuropathy in her feet at times. She denies polyuria, polydipsia, or polyphagia. She tries to follow a healthy diet and stay active.   She continues to smoke 1.5 pack a day but wants to try nicotine patch that she had while in the hospital.     Past Medical History  Diagnosis Date  . Arteriosclerotic cardiovascular disease (ASCVD)     Minimal at cath in [facility].stress nuclear study in 8/08 with nl EF; neg stress echo in 2010  . Diabetes mellitus, type 2 2000   Onset in 2000; no insulin  . Hyperlipidemia   . Hypertension `    during treatment with Geodon  . Gastroesophageal reflux disease     Schatzki's ring  . Anemia, iron deficiency   . Alcohol abuse   . Depression   . Community acquired pneumonia 01/03/10, 05/2010, 04/2012    2011; with pleural effusion-[facility] acute resp failure; intubated in Jan 2014 (HMPV pneumonia)  . Obesity   . Schizoaffective disorder     requiring multiple psychiatric admissions  . Dysphagia   . Diastolic dysfunction     grade 2 per echo 2011  . Pulmonary hypertension 05/02/2012    Patient needs repeat echo in 06/2012   . History of alcohol abuse 07/22/2007    Qualifier: Diagnosis of  By: Lenn Cal     Current Outpatient Prescriptions  Medication Sig Dispense Refill  . albuterol (PROVENTIL HFA;VENTOLIN HFA) 108 (90 BASE) MCG/ACT inhaler Inhale 2 puffs into the lungs 3 (three) times daily as needed for wheezing or shortness of breath.  1 Inhaler  2  . Ascorbic Acid (VITAMIN C) 1000 MG tablet Take 1,000 mg by mouth daily.      Marland Kitchen esomeprazole (NEXIUM) 20 MG capsule Take 20 mg by mouth daily at 12 noon.      . fexofenadine (ALLEGRA) 60 MG tablet Take 1 tablet (60 mg total) by mouth 2 (two) times daily.  30 tablet  0  . guaiFENesin-dextromethorphan (ROBITUSSIN DM) 100-10 MG/5ML syrup Take 5  mLs by mouth every 4 (four) hours as needed for cough.  118 mL  0  . hydrOXYzine (ATARAX/VISTARIL) 10 MG tablet Take 10 mg by mouth 3 (three) times daily as needed for anxiety.      Marland Kitchen ibuprofen (ADVIL,MOTRIN) 200 MG tablet Take 400 mg by mouth every 8 (eight) hours as needed. Aches and pains, fever      . Insulin Glargine (LANTUS SOLOSTAR) 100 UNIT/ML SOPN Inject 24 Units into the skin at bedtime.       Marland Kitchen lisinopril (PRINIVIL,ZESTRIL) 5 MG tablet Take 1 tablet (5 mg total) by mouth daily.  30 tablet  1  . loratadine-pseudoephedrine (CLARITIN-D 24-HOUR) 10-240 MG per 24 hr tablet Take 1 tablet by mouth daily as needed for  allergies.      Marland Kitchen lovastatin (MEVACOR) 20 MG tablet Take 1 tablet (20 mg total) by mouth at bedtime.  90 tablet  3  . meclizine (ANTIVERT) 50 MG tablet Take 50 mg by mouth 3 (three) times daily as needed for dizziness.      . metFORMIN (GLUCOPHAGE) 1000 MG tablet Take 1,000 mg by mouth 2 (two) times daily with a meal.      . metoprolol (LOPRESSOR) 50 MG tablet Take 50 mg by mouth 2 (two) times daily.      . ziprasidone (GEODON) 80 MG capsule Take 1 capsule (80 mg total) by mouth 2 (two) times daily with a meal.  60 capsule  0   No current facility-administered medications for this visit.   Family History  Problem Relation Age of Onset  . Colon cancer Other   . Hypertension Mother   . Stroke Father     deceased at age 95  . Heart disease Sister   . Anesthesia problems Neg Hx   . Hypotension Neg Hx   . Malignant hyperthermia Neg Hx   . Pseudochol deficiency Neg Hx    History   Social History  . Marital Status: Single    Spouse Name: N/A    Number of Children: N/A  . Years of Education: 12   Occupational History  . Disability   . UNEMPLOYED    Social History Main Topics  . Smoking status: Heavy Tobacco Smoker -- 2.00 packs/day for 30 years    Types: Cigarettes    Start date: 05/18/2012  . Smokeless tobacco: Never Used     Comment: Has almostquit totally.  . Alcohol Use: No  . Drug Use: No  . Sexual Activity: Not on file   Other Topics Concern  . 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       Weight gain  HENT: Negative for congestion and sore throat.   Eyes: Positive for blurred vision.  Respiratory: Negative for cough,  shortness of breath and wheezing.   Cardiovascular: Positive for orthopnea (at baseline) and leg swelling. Negative for chest pain, palpitations and PND.  Gastrointestinal: Negative for nausea, vomiting, abdominal pain, diarrhea, constipation and blood in stool.  Genitourinary: Negative for dysuria, urgency and frequency.  Musculoskeletal: Negative for myalgias.  Neurological: Negative for dizziness, weakness and headaches.  Endo/Heme/Allergies: Negative for polydipsia.    Objective:  Physical Exam: Danley Danker  Vitals:   09/23/13 1057  BP: 150/80  Pulse: 77  Temp: 98.6 F (37 C)  TempSrc: Oral  Weight: 220 lb 9.6 oz (100.064 kg)  SpO2: 100%    Physical Exam  Constitutional: She is oriented to person, place, and time. She appears well-developed and well-nourished. No distress.  HENT:  Head: Normocephalic and atraumatic.  Right Ear: External ear normal.  Left Ear: External ear normal.  Nose: Nose normal.  Mouth/Throat: Oropharynx is clear and moist. No oropharyngeal exudate.  Eyes: Conjunctivae and EOM are normal. Pupils are equal, round, and reactive to light. Right eye exhibits no discharge. Left eye exhibits no discharge. No scleral icterus.  Neck: Normal range of motion. Neck supple.  Cardiovascular: Normal rate, regular rhythm and normal heart sounds.   Pulmonary/Chest: Effort normal and breath sounds normal. No respiratory distress. She has no wheezes. She has no rales.  Abdominal: Soft. Bowel sounds are normal. She exhibits distension. There is no tenderness. There is no rebound and no guarding.  Musculoskeletal: Normal range of motion. She exhibits edema (+1 non-pitting b/l LE). She exhibits no tenderness.  Neurological: She is alert and oriented to person, place, and time.  Skin: Skin is warm and dry. No rash noted. She is not diaphoretic. No erythema. No pallor.  Psychiatric: She has a normal mood and affect. Her behavior is normal. Judgment and thought content normal.     Assessment & Plan:   Please see problem list for problem-based assessment and plan

## 2013-09-23 NOTE — Patient Instructions (Addendum)
-  Start taking lasix 20 mg daily in addition to lisinopril 5 mg daily and lopressor 50 mg twice a day -Start applying nicotine patch daily  -Will check your lab work today and call you with the results -Will see you back in 1 week for repeat blood pressure check and blood work -[facility] you!  General Instructions:   Thank you for bringing your medicines today. This helps [name] keep you safe from mistakes.   Progress Toward Treatment Goals:  Treatment Goal 12/10/2012  Hemoglobin A1C improved  Blood pressure at goal  Stop smoking smoking more    Self Care Goals & Plans:  Self Care Goal 07/22/2013  Manage my medications take my medicines as prescribed; bring my medications to every visit; refill my medications on time  Monitor my health keep track of my blood glucose; check my feet daily; bring my glucose meter and log to each visit  Eat healthy foods eat foods that are low in salt; eat baked foods instead of fried foods  Be physically active find an activity I enjoy; take a walk every day  Stop smoking (No Data)    Home Blood Glucose Monitoring 12/10/2012  Check my blood sugar 2 times a day  When to check my blood sugar before breakfast; before dinner     Care Management & Community Referrals:  Referral 01/23/2013  Referrals made for care management support none needed  Referrals made to community resources -

## 2013-09-24 ENCOUNTER — Telehealth: Payer: Self-pay | Admitting: Internal Medicine

## 2013-09-24 ENCOUNTER — Telehealth: Payer: Self-pay | Admitting: Pharmacist

## 2013-09-24 DIAGNOSIS — I10 Essential (primary) hypertension: Secondary | ICD-10-CM

## 2013-09-24 MED ORDER — LISINOPRIL 5 MG PO TABS: 10.0000 mg | ORAL_TABLET | Freq: Every day | ORAL | Status: DC

## 2013-09-24 NOTE — Assessment & Plan Note (Signed)
Assessment: Pt with moderately well-controlled hypertension compliant with 2-class (BB & ACEi) anti-hypertensive therapy, recently off of diuretic therapy, who presents with blood pressure of 150/80.   Plan:  -BP 150/80 not at goal <140/80 -Start furosemide 20 mg daily -Continue lisinopril 5 mg daily -Continue metoprolol 50 mg BID  -Obtain BMP ---> normal  -To return in 1 week

## 2013-09-24 NOTE — Assessment & Plan Note (Addendum)
Assessment: Pt with grade 2 diastolic dysfunction recently off diuretic therapy who presents with weight gain and LE swelling without respiratory complaints.    Plan:  -Start furosemide 20 mg daily  -Pt requested thyroid function testing due to weight gain ---> normal  -To return in 1 week

## 2013-09-24 NOTE — Telephone Encounter (Signed)
Patient requested information on the brand of nicotine patch carried inpatient (stated it worked well for her and is trying to find the same product outpatient).  Attempted to contact patient to inform her that it is the [employer] (Habitrol). Unable to reach the patient after 2 attempts.

## 2013-09-24 NOTE — Telephone Encounter (Signed)
[name] called the on-call pager to report that her BP is still elevated at 160/93. She has been taking Lasix 20mg  daily for two days now with some increase in diuresis but still has LE edema. She checks her BP at home everyday.  In reviewing Dr. Waynette Buttery note from the [place] on 6/10, the pt's BP was elevated to 150/80 and Lasix 20mg  daily while she was kept on Lisinopril 5mg  daily and metoprolol 50mg  BID with follow up at the [facility] on 6/18.  It does not appear that she has been on Lisinopril 10mg  daily. She does not recall having low blood pressure or side effects with there higher dose of ACEi.  She had BMET on 6/10 with normal potassium, normal Cr.   She was instructed to start taking Lisinopril 10mg  daily and continue taking Lasix 20mg  daily while continue to check her BP daily. She agrees to keep her appointment on 6/18 for BP recheck.   Sent a message to Dr. Naaman Plummer who will be seeing her on 6/18 for f/u visit.

## 2013-09-24 NOTE — Assessment & Plan Note (Addendum)
Assessment: Pt with chronic tobacco abuse, currently smoking 1.5 packs of cigarettes a day, who wants to quit.   Plan:  -Start nicotine 21 mg daily patch for 4 weeks  -For weeks 5 & 6, switch to 14 mg daily patch -For weeks 7 & 8, switch to 7 mg daily patch

## 2013-09-24 NOTE — Assessment & Plan Note (Addendum)
Assessment: Pt with last A1c of 7.9 on 09/04/13 compliant with oral hypoglycemic and insulin therapy with recent symptomatic hypoglycemic episode who presents with blood glucose level of 149.   Plan: -Last A1c 7.9 not at goal <7, continue metformin 1000 mg BID and Lantus 24 U daily  -BP 150/80 not at goal <140/90, start furosemide 20 mg daily, continue lisinopril 5 mg daily and metoprolol 50 mg BID  -LDL 64, at goal <100 , continue lovastatin 20 mg daily  -Annual foot (02/2013) and eye exam (03/2013) completed  -Obtain urine micro albumin testing at next visit -BMI 37.85, not at goal of <25, encourage weight loss -Consider daily 81 mg aspirin for primary CVD prevention

## 2013-09-25 NOTE — Progress Notes (Signed)
Case discussed with Dr. Rabbani at the time of the visit.  We reviewed the resident's history and exam and pertinent patient test results.  I agree with the assessment, diagnosis, and plan of care documented in the resident's note. 

## 2013-09-30 ENCOUNTER — Ambulatory Visit (INDEPENDENT_AMBULATORY_CARE_PROVIDER_SITE_OTHER): Payer: Medicare Other | Admitting: Internal Medicine

## 2013-09-30 ENCOUNTER — Encounter: Payer: Self-pay | Admitting: Internal Medicine

## 2013-09-30 VITALS — BP 116/71 | HR 93 | Temp 97.9°F | Wt 216.1 lb

## 2013-09-30 DIAGNOSIS — I10 Essential (primary) hypertension: Secondary | ICD-10-CM

## 2013-09-30 DIAGNOSIS — I5030 Unspecified diastolic (congestive) heart failure: Secondary | ICD-10-CM | POA: Diagnosis not present

## 2013-09-30 DIAGNOSIS — R51 Headache: Secondary | ICD-10-CM | POA: Diagnosis not present

## 2013-09-30 DIAGNOSIS — N938 Other specified abnormal uterine and vaginal bleeding: Secondary | ICD-10-CM | POA: Diagnosis not present

## 2013-09-30 DIAGNOSIS — N925 Other specified irregular menstruation: Secondary | ICD-10-CM | POA: Diagnosis not present

## 2013-09-30 DIAGNOSIS — F17200 Nicotine dependence, unspecified, uncomplicated: Secondary | ICD-10-CM

## 2013-09-30 DIAGNOSIS — E119 Type 2 diabetes mellitus without complications: Secondary | ICD-10-CM | POA: Diagnosis not present

## 2013-09-30 DIAGNOSIS — J069 Acute upper respiratory infection, unspecified: Secondary | ICD-10-CM | POA: Diagnosis not present

## 2013-09-30 DIAGNOSIS — R634 Abnormal weight loss: Secondary | ICD-10-CM | POA: Diagnosis not present

## 2013-09-30 LAB — BASIC METABOLIC PANEL
BUN: 7 mg/dL (ref 6–23)
CALCIUM: 9.4 mg/dL (ref 8.4–10.5)
CO2: 23 mEq/L (ref 19–32)
CREATININE: 0.83 mg/dL (ref 0.50–1.10)
Chloride: 101 mEq/L (ref 96–112)
GLUCOSE: 192 mg/dL — AB (ref 70–99)
Potassium: 3.6 mEq/L (ref 3.5–5.3)
Sodium: 139 mEq/L (ref 135–145)

## 2013-09-30 NOTE — Patient Instructions (Addendum)
-  Continue taking lasix 20 mg daily, lisinopril 5 mg daily, and metoprolol 50 mg BID -Will check your labs today and call you with abnormal results -Keep checking your blood pressure at home  -Will see you back in 2 months, nice seeing you again!   General Instructions:   Please try to bring all your medicines next time. This will help [name] keep you safe from mistakes.   Progress Toward Treatment Goals:  Treatment Goal 12/10/2012  Hemoglobin A1C improved  Blood pressure at goal  Stop smoking smoking more    Self Care Goals & Plans:  Self Care Goal 07/22/2013  Manage my medications take my medicines as prescribed; bring my medications to every visit; refill my medications on time  Monitor my health keep track of my blood glucose; check my feet daily; bring my glucose meter and log to each visit  Eat healthy foods eat foods that are low in salt; eat baked foods instead of fried foods  Be physically active find an activity I enjoy; take a walk every day  Stop smoking (No Data)    Home Blood Glucose Monitoring 12/10/2012  Check my blood sugar 2 times a day  When to check my blood sugar before breakfast; before dinner     Care Management & Community Referrals:  Referral 01/23/2013  Referrals made for care management support none needed  Referrals made to community resources -

## 2013-09-30 NOTE — Progress Notes (Signed)
Patient ID: ORVETTA DANIELSKI, female   DOB: 1965/08/26, 48 y.o.   MRN: [id number]   Subjective:   Patient ID: MERCIA DOWE female   DOB: 1965-06-15 48 y.o.   MRN: [id number]  HPI: Ms.Shayma R Goga is a 48 y.o. woman with past medical history of insulin-dependent Type II DM, hypertension, grade 2 diastolic dysfunction, chronic microcytic anemia, schizoaffective disorder, GERD, and tobacco abuse who presents for follow-up of blood pressure.   She reports that since last visit she has been compliant with taking lasix 20 mg daily, lisinopril 5 mg daily, and metoprolol 50 mg BID. On 6/11 her blood pressure was 160/93 and she called the after-hours pager in which she was instructed to increase her dosage of lisinopril to 10 mg daily, however she reports she did not due to fear her blood pressure would drop too low. She reports improved LE swelling and 4 lb weight loss since last visit 1 week ago. She denies dyspnea with exertion, orthopnea worse from baseline, or dietary indiscretion.  Her last A1c was 7.9 in May. She reports compliance with Lantus 24 U daily and metformin 1000 mg BID. She denies recent symptomatic hypoglycemia. She reports checking her glucose once daily with recent values in the 200's. She has baseline blurry vision and occasional neuropathy in her feet. She denies polyuria, polydipsia, or polyphagia. She tries to follow a healthy diet and stay active.   She has not yet used the nicotine patch that was prescribed at last visit. She continues to smoke 1.5 pack a day but is motivated to quit.      Past Medical History  Diagnosis Date  . Arteriosclerotic cardiovascular disease (ASCVD)     Minimal at cath in [facility].stress nuclear study in 8/08 with nl EF; neg stress echo in 2010  . Diabetes mellitus, type 2 2000    Onset in 2000; no insulin  . Hyperlipidemia   . Hypertension `    during treatment with Geodon  . Gastroesophageal reflux disease     Schatzki's ring    . Anemia, iron deficiency   . Alcohol abuse   . Depression   . Community acquired pneumonia 01/03/10, 05/2010, 04/2012    2011; with pleural effusion-[facility] acute resp failure; intubated in Jan 2014 (HMPV pneumonia)  . Obesity   . Schizoaffective disorder     requiring multiple psychiatric admissions  . Dysphagia   . Diastolic dysfunction     grade 2 per echo 2011  . Pulmonary hypertension 05/02/2012    Patient needs repeat echo in 06/2012   . History of alcohol abuse 07/22/2007    Qualifier: Diagnosis of  By: Lenn Cal     Current Outpatient Prescriptions  Medication Sig Dispense Refill  . albuterol (PROVENTIL HFA;VENTOLIN HFA) 108 (90 BASE) MCG/ACT inhaler Inhale 2 puffs into the lungs 3 (three) times daily as needed for wheezing or shortness of breath.  1 Inhaler  2  . Ascorbic Acid (VITAMIN C) 1000 MG tablet Take 1,000 mg by mouth daily.      Marland Kitchen esomeprazole (NEXIUM) 20 MG capsule Take 20 mg by mouth daily at 12 noon.      . Ferrous Sulfate (IRON) 28 MG TABS Take 1 tablet by mouth daily.      . fexofenadine (ALLEGRA) 60 MG tablet Take 1 tablet (60 mg total) by mouth 2 (two) times daily.  30 tablet  0  . furosemide (LASIX) 20 MG tablet Take 1 tablet (20 mg total) by  mouth daily.  30 tablet  2  . guaiFENesin-dextromethorphan (ROBITUSSIN DM) 100-10 MG/5ML syrup Take 5 mLs by mouth every 4 (four) hours as needed for cough.  118 mL  0  . hydrOXYzine (ATARAX/VISTARIL) 10 MG tablet Take 10 mg by mouth 2 (two) times daily as needed for anxiety.       Marland Kitchen ibuprofen (ADVIL,MOTRIN) 200 MG tablet Take 400 mg by mouth every 8 (eight) hours as needed. Aches and pains, fever      . Insulin Glargine (LANTUS SOLOSTAR) 100 UNIT/ML SOPN Inject 24 Units into the skin at bedtime.       Marland Kitchen lisinopril (PRINIVIL,ZESTRIL) 5 MG tablet Take 2 tablets (10 mg total) by mouth daily.  30 tablet  1  . loratadine-pseudoephedrine (CLARITIN-D 24-HOUR) 10-240 MG per 24 hr tablet Take 1 tablet by mouth daily as  needed for allergies.      Marland Kitchen lovastatin (MEVACOR) 20 MG tablet Take 1 tablet (20 mg total) by mouth at bedtime.  90 tablet  3  . meclizine (ANTIVERT) 50 MG tablet Take 50 mg by mouth 3 (three) times daily as needed for dizziness.      . metFORMIN (GLUCOPHAGE) 1000 MG tablet Take 1,000 mg by mouth 2 (two) times daily with a meal.      . metoprolol (LOPRESSOR) 50 MG tablet Take 50 mg by mouth 2 (two) times daily.      . nicotine (NICODERM CQ - DOSED IN MG/24 HOURS) 21 mg/24hr patch Place 1 patch (21 mg total) onto the skin daily.  28 patch  1  . ziprasidone (GEODON) 80 MG capsule Take 1 capsule (80 mg total) by mouth 2 (two) times daily with a meal.  60 capsule  0   No current facility-administered medications for this visit.   Family History  Problem Relation Age of Onset  . Colon cancer Other   . Hypertension Mother   . Stroke Father     deceased at age 43  . Heart disease Sister   . Anesthesia problems Neg Hx   . Hypotension Neg Hx   . Malignant hyperthermia Neg Hx   . Pseudochol deficiency Neg Hx    History   Social History  . Marital Status: Single    Spouse Name: N/A    Number of Children: N/A  . Years of Education: 12   Occupational History  . Disability   . UNEMPLOYED    Social History Main Topics  . Smoking status: Heavy Tobacco Smoker -- 1.50 packs/day for 30 years    Types: Cigarettes    Start date: 05/18/2012  . Smokeless tobacco: Never Used     Comment: Has almostquit totally.  . Alcohol Use: No  . Drug Use: No  . Sexual Activity: None   Other Topics Concern  . 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 Negative for fever, chills and  malaise/fatigue.  HENT: Negative for congestion.   Eyes: Positive for blurred vision (at baseline).  Respiratory: Negative for cough and shortness of breath.   Cardiovascular: Positive for orthopnea (at baseline) and leg swelling (improved). Negative for chest pain.  Gastrointestinal: Negative for nausea, vomiting, abdominal pain, diarrhea and constipation.  Genitourinary: Positive  for frequency (due to diuretic therapy). Negative for dysuria and urgency.  Neurological: Positive for dizziness, sensory change (peripheral neuropathy at baseline) and headaches (mild). Negative for weakness.    Objective:  Physical Exam: Filed Vitals:   09/30/13 1538  BP: 116/71  Pulse: 93  Temp: 97.9 F (36.6 C)  TempSrc: Oral  Weight: 216 lb 1.6 oz (98.022 kg)  SpO2: 99%    Physical Exam  Constitutional: She is oriented to person, place, and time. She appears well-developed and well-nourished. No distress.  HENT:  Head: Normocephalic and atraumatic.  Eyes: EOM are normal.  Neck: Normal range of motion. Neck supple.  Cardiovascular: Normal rate, regular rhythm and normal heart sounds.   Pulmonary/Chest: Effort normal and breath sounds normal. No respiratory distress. She has no wheezes. She has no rales.  Abdominal: Soft. Bowel sounds are normal. She exhibits no distension. There is no tenderness. There is no rebound and no guarding.  Musculoskeletal: Normal range of motion. She exhibits edema (Trace bilateral LE edema). She exhibits no tenderness.  Neurological: She is alert and oriented to person, place, and time.  Skin: Skin is warm and dry. No rash noted. She is not diaphoretic. No erythema. No pallor.  Psychiatric: She has a normal mood and affect. Her behavior is normal. Judgment and thought content normal.    Assessment & Plan:   Please see problem list for problem-based assessment and plan

## 2013-10-01 ENCOUNTER — Ambulatory Visit: Payer: Self-pay | Admitting: Internal Medicine

## 2013-10-01 LAB — MICROALBUMIN / CREATININE URINE RATIO
Creatinine, Urine: 68.1 mg/dL
Microalb Creat Ratio: 7.3 mg/g (ref 0.0–30.0)
Microalb, Ur: 0.5 mg/dL (ref 0.00–1.89)

## 2013-10-01 NOTE — Assessment & Plan Note (Addendum)
Assessment: Pt with last A1c of 7.9 on 09/04/13 compliant with oral hypoglycemic and insulin therapy with recent symptomatic hypoglycemic episode who presents with blood glucose level of 192.   Plan:  -Last A1c 7.9 not at goal <7, continue metformin 1000 mg BID and Lantus 24 U daily  -BP 116/71 at goal <140/90, continue furosemide 20 mg daily, lisinopril 5 mg daily, and metoprolol 50 mg BID  -LDL 64, at goal <100, continue lovastatin 20 mg daily  -Annual foot (02/2013) and eye exam (03/2013) completed  -Obtain urine micro albumin testing  -BMI 37.08, not at goal of <25, encourage weight loss  -Consider daily 81 mg aspirin for primary CVD prevention

## 2013-10-01 NOTE — Assessment & Plan Note (Addendum)
Assessment: Pt with moderately well-controlled hypertension compliant with 3-class (BB, ACEi, & diuretic) anti-hypertensive therapy who presents with blood pressure of 116/71.   Plan:  -BP 116/71 at goal <140/90  -Continue furosemide 20 mg daily  -Continue lisinopril 5 mg daily  -Continue metoprolol 50 mg BID  -Obtain BMP ---> normal

## 2013-10-01 NOTE — Progress Notes (Signed)
Case discussed with Dr. Rabbani at the time of the visit.  We reviewed the resident's history and exam and pertinent patient test results.  I agree with the assessment, diagnosis, and plan of care documented in the resident's note. 

## 2013-10-01 NOTE — Assessment & Plan Note (Addendum)
Assessment: Pt with chronic tobacco abuse, currently smoking 1.5 packs of cigarettes a day, who is contemplating quitting smoking with nicotine patch replacement therapy.   Plan:  -Pt to start nicotine 21 mg daily patch for 4 weeks  -For weeks 5 & 6, switch to 14 mg daily patch  -For weeks 7 & 8, switch to 7 mg daily patch

## 2013-10-01 NOTE — Assessment & Plan Note (Signed)
Assessment: Pt with grade 2 diastolic dysfunction with last 2D-echo on 10/20/12 recently restarted on diuretic therapy who presents with euvolemia.    Plan:  -Continue furosemide 20 mg daily  -Continue to monitor volume status

## 2013-10-02 ENCOUNTER — Telehealth: Payer: Self-pay | Admitting: Internal Medicine

## 2013-10-02 NOTE — Telephone Encounter (Signed)
Got a page on the on-call pager. [name] states that she has been having earache for at least a day with a dry cough. She was concerned that this could be a reaction to the lisinopril. In reviewing her chart, she has been on Lisinopril for awhile with no cough reported. She states she was treated for URI and ear infections 3 weeks ago with a Zpack. Her symptoms resolved but she feels like they are returning. She is around children a lot. She denies fever/chills, N/V. She rates her ear pain as 10/10.  I advised her to go to the ED or Urgent Care for evaluation of her ear pain since the clinic is now closed.   She verbalized understanding and agreed with this plan.

## 2013-10-03 ENCOUNTER — Emergency Department (HOSPITAL_COMMUNITY)
Admission: EM | Admit: 2013-10-03 | Discharge: 2013-10-03 | Disposition: A | Discharge status: Home / Self Care | Payer: Medicare Other | Attending: Emergency Medicine | Admitting: Emergency Medicine

## 2013-10-03 ENCOUNTER — Encounter (HOSPITAL_COMMUNITY): Payer: Self-pay | Admitting: Emergency Medicine

## 2013-10-03 DIAGNOSIS — H9209 Otalgia, unspecified ear: Secondary | ICD-10-CM | POA: Insufficient documentation

## 2013-10-03 DIAGNOSIS — D509 Iron deficiency anemia, unspecified: Secondary | ICD-10-CM | POA: Insufficient documentation

## 2013-10-03 DIAGNOSIS — Z794 Long term (current) use of insulin: Secondary | ICD-10-CM | POA: Insufficient documentation

## 2013-10-03 DIAGNOSIS — I10 Essential (primary) hypertension: Secondary | ICD-10-CM | POA: Diagnosis not present

## 2013-10-03 DIAGNOSIS — K219 Gastro-esophageal reflux disease without esophagitis: Secondary | ICD-10-CM | POA: Diagnosis not present

## 2013-10-03 DIAGNOSIS — E119 Type 2 diabetes mellitus without complications: Secondary | ICD-10-CM | POA: Diagnosis not present

## 2013-10-03 DIAGNOSIS — E785 Hyperlipidemia, unspecified: Secondary | ICD-10-CM | POA: Insufficient documentation

## 2013-10-03 DIAGNOSIS — Z880 Allergy status to penicillin: Secondary | ICD-10-CM | POA: Insufficient documentation

## 2013-10-03 DIAGNOSIS — F259 Schizoaffective disorder, unspecified: Secondary | ICD-10-CM | POA: Diagnosis not present

## 2013-10-03 DIAGNOSIS — F329 Major depressive disorder, single episode, unspecified: Secondary | ICD-10-CM | POA: Diagnosis not present

## 2013-10-03 DIAGNOSIS — R42 Dizziness and giddiness: Secondary | ICD-10-CM

## 2013-10-03 DIAGNOSIS — E669 Obesity, unspecified: Secondary | ICD-10-CM | POA: Insufficient documentation

## 2013-10-03 DIAGNOSIS — F17200 Nicotine dependence, unspecified, uncomplicated: Secondary | ICD-10-CM | POA: Insufficient documentation

## 2013-10-03 DIAGNOSIS — J069 Acute upper respiratory infection, unspecified: Secondary | ICD-10-CM | POA: Diagnosis not present

## 2013-10-03 DIAGNOSIS — H9202 Otalgia, left ear: Secondary | ICD-10-CM

## 2013-10-03 DIAGNOSIS — Z79899 Other long term (current) drug therapy: Secondary | ICD-10-CM | POA: Insufficient documentation

## 2013-10-03 DIAGNOSIS — Z8701 Personal history of pneumonia (recurrent): Secondary | ICD-10-CM | POA: Insufficient documentation

## 2013-10-03 DIAGNOSIS — F3289 Other specified depressive episodes: Secondary | ICD-10-CM | POA: Diagnosis not present

## 2013-10-03 MED ORDER — CEPHALEXIN 500 MG PO CAPS
500.0000 mg | ORAL_CAPSULE | Freq: Once | ORAL | Status: AC
  Administered 2013-10-03: 500 mg via ORAL
  Filled 2013-10-03: qty 1

## 2013-10-03 MED ORDER — ACETAMINOPHEN-CODEINE #3 300-30 MG PO TABS
2.0000 | ORAL_TABLET | Freq: Once | ORAL | Status: AC
  Administered 2013-10-03: 2 via ORAL
  Filled 2013-10-03: qty 2

## 2013-10-03 MED ORDER — MECLIZINE HCL 12.5 MG PO TABS
25.0000 mg | ORAL_TABLET | Freq: Once | ORAL | Status: AC
  Administered 2013-10-03: 25 mg via ORAL
  Filled 2013-10-03: qty 2

## 2013-10-03 MED ORDER — CEPHALEXIN 500 MG PO CAPS: 500.0000 mg | ORAL_CAPSULE | Freq: Four times a day (QID) | ORAL | Status: DC

## 2013-10-03 MED ORDER — MECLIZINE HCL 50 MG PO TABS: 50.0000 mg | ORAL_TABLET | Freq: Three times a day (TID) | ORAL | Status: DC | PRN

## 2013-10-03 MED ORDER — PSEUDOEPHEDRINE HCL 60 MG PO TABS
60.0000 mg | ORAL_TABLET | Freq: Once | ORAL | Status: AC
  Administered 2013-10-03: 60 mg via ORAL
  Filled 2013-10-03: qty 1

## 2013-10-03 MED ORDER — ACETAMINOPHEN-CODEINE #3 300-30 MG PO TABS: 1.0000 | ORAL_TABLET | Freq: Four times a day (QID) | ORAL | Status: DC | PRN

## 2013-10-03 NOTE — ED Notes (Signed)
Patient c/o left ear pain for several days.

## 2013-10-03 NOTE — ED Provider Notes (Signed)
CSN: [id number]     Arrival date & time 10/03/13  2101 History   First MD Initiated Contact with Patient 10/03/13 2118     Chief Complaint  Patient presents with  . Otalgia     (Consider location/radiation/quality/duration/timing/severity/associated sxs/prior Treatment) Patient is a 48 y.o. female presenting with ear pain. The history is provided by the patient.  Otalgia Location:  Left Behind ear:  No abnormality Quality:  Aching and throbbing Severity:  Severe Onset quality:  Gradual Duration:  4 days Timing:  Intermittent Progression:  Worsening Chronicity:  New Context: not direct blow, not foreign body in ear and not loud noise   Relieved by:  Nothing Worsened by:  Nothing tried Associated symptoms: headaches and sore throat   Associated symptoms: no abdominal pain, no cough, no fever, no neck pain and no vomiting   Risk factors: no recent travel, no chronic ear infection and no prior ear surgery     Past Medical History  Diagnosis Date  . Arteriosclerotic cardiovascular disease (ASCVD)     Minimal at cath in [facility].stress nuclear study in 8/08 with nl EF; neg stress echo in 2010  . Diabetes mellitus, type 2 2000    Onset in 2000; no insulin  . Hyperlipidemia   . Hypertension `    during treatment with Geodon  . Gastroesophageal reflux disease     Schatzki's ring  . Anemia, iron deficiency   . Alcohol abuse   . Depression   . Community acquired pneumonia 01/03/10, 05/2010, 04/2012    2011; with pleural effusion-[facility] acute resp failure; intubated in Jan 2014 (HMPV pneumonia)  . Obesity   . Schizoaffective disorder     requiring multiple psychiatric admissions  . Dysphagia   . Diastolic dysfunction     grade 2 per echo 2011  . Pulmonary hypertension 05/02/2012    Patient needs repeat echo in 06/2012   . History of alcohol abuse 07/22/2007    Qualifier: Diagnosis of  By: Lenn Cal     Past Surgical History  Procedure Laterality Date  .  Dilation and curettage, diagnostic / therapeutic  1992  . Esophagogastroduodenoscopy  09/16/08    Dr. Trevor Iha hiatal hernia/excoriations involving the cardia and mucosa consistent with trauma, antral erosions  of linear petechiae ? gastritis versus early gastric antral vascular  ectasia.Marland Kitchen biopsy showed reactive gastropathy. No H. pylori.  . Esophagogastroduodenoscopy  09/2007    Dr. Evalee Mutton ring, dilated to 90 French Maloney dilator, small hiatal hernia, antral erosions, biopsies reactive gastropathy.  Azzie Almas dilation  07/17/2011    [facility] sedation-->distal esophageal stricture s/p dilation, chronic gastritis, multiple ulcers in stomach. no h.pylori  . Colonoscopy  01/2006    internal hemorrhoids  . Colonoscopy  01/10/2012    Procedure: COLONOSCOPY;  Surgeon: Daneil Dolin, MD;  Location: [facility];  Service: Endoscopy;  Laterality: N/A;  entered cecum @ [phone] ; total cecal withdrawal time = 8 minutes   Family History  Problem Relation Age of Onset  . Colon cancer Other   . Hypertension Mother   . Stroke Father     deceased at age 34  . Heart disease Sister   . Anesthesia problems Neg Hx   . Hypotension Neg Hx   . Malignant hyperthermia Neg Hx   . Pseudochol deficiency Neg Hx    History  Substance Use Topics  . Smoking status: Heavy Tobacco Smoker -- 1.50 packs/day for 30 years    Types: Cigarettes  Start date: 05/18/2012  . Smokeless tobacco: Never Used     Comment: Has almostquit totally.  . Alcohol Use: No   OB History   Grav Para Term Preterm Abortions TAB SAB Ect Mult Living   3 2 2  1 1    2      Review of Systems  Constitutional: Negative for fever and activity change.       All ROS Neg except as noted in HPI  HENT: Positive for ear pain and sore throat. Negative for nosebleeds.   Eyes: Negative for photophobia and discharge.  Respiratory: Negative for cough, shortness of breath and wheezing.   Cardiovascular: Negative for chest pain and palpitations.   Gastrointestinal: Negative for vomiting, abdominal pain and blood in stool.  Genitourinary: Negative for dysuria, frequency and hematuria.  Musculoskeletal: Negative for arthralgias, back pain and neck pain.  Skin: Negative.   Neurological: Positive for dizziness and headaches. Negative for seizures and speech difficulty.  Psychiatric/Behavioral: Negative for hallucinations and confusion.      Allergies  Metronidazole; Orange; Shrimp; Penicillins; Sulfonamide derivatives; Glipizide; and Sulfamethoxazole-trimethoprim  Home Medications   Prior to Admission medications   Medication Sig Start Date End Date Taking? Authorizing Kieana Livesay  albuterol (PROVENTIL HFA;VENTOLIN HFA) 108 (90 BASE) MCG/ACT inhaler Inhale 2 puffs into the lungs 3 (three) times daily as needed for wheezing or shortness of breath. 10/27/12   Rigoberto Noel, MD  Ascorbic Acid (VITAMIN C) 1000 MG tablet Take 1,000 mg by mouth daily.    Historical Wende Longstreth, MD  esomeprazole (NEXIUM) 20 MG capsule Take 20 mg by mouth daily at 12 noon.    Historical Lucindia Lemley, MD  Ferrous Sulfate (IRON) 28 MG TABS Take 1 tablet by mouth daily.    Historical Alonza Knisley, MD  fexofenadine (ALLEGRA) 60 MG tablet Take 1 tablet (60 mg total) by mouth 2 (two) times daily. 09/10/13   Johnna Acosta, MD  furosemide (LASIX) 20 MG tablet Take 1 tablet (20 mg total) by mouth daily. 09/23/13 09/23/14  Juluis Mire, MD  guaiFENesin-dextromethorphan (ROBITUSSIN DM) 100-10 MG/5ML syrup Take 5 mLs by mouth every 4 (four) hours as needed for cough. 09/04/13   Carter Kitten, MD  hydrOXYzine (ATARAX/VISTARIL) 10 MG tablet Take 10 mg by mouth 2 (two) times daily as needed for anxiety.     Historical Merril Nagy, MD  ibuprofen (ADVIL,MOTRIN) 200 MG tablet Take 400 mg by mouth every 8 (eight) hours as needed. Aches and pains, fever    Historical Imir Brumbach, MD  Insulin Glargine (LANTUS SOLOSTAR) 100 UNIT/ML SOPN Inject 24 Units into the skin at bedtime.     Historical Treavon Castilleja,  MD  lisinopril (PRINIVIL,ZESTRIL) 5 MG tablet Take 2 tablets (10 mg total) by mouth daily. 09/24/13   Blain Pais, MD  loratadine-pseudoephedrine (CLARITIN-D 24-HOUR) 10-240 MG per 24 hr tablet Take 1 tablet by mouth daily as needed for allergies.    Historical Everlena Mackley, MD  lovastatin (MEVACOR) 20 MG tablet Take 1 tablet (20 mg total) by mouth at bedtime. 09/01/13   Neema Bobbie Stack, MD  meclizine (ANTIVERT) 50 MG tablet Take 50 mg by mouth 3 (three) times daily as needed for dizziness.    Historical Kiara Keep, MD  metFORMIN (GLUCOPHAGE) 1000 MG tablet Take 1,000 mg by mouth 2 (two) times daily with a meal.    Historical Evola Hollis, MD  metoprolol (LOPRESSOR) 50 MG tablet Take 50 mg by mouth 2 (two) times daily.    Historical Malina Geers, MD  nicotine (NICODERM CQ - DOSED IN  MG/24 HOURS) 21 mg/24hr patch Place 1 patch (21 mg total) onto the skin daily. 09/23/13   Juluis Mire, MD  ziprasidone (GEODON) 80 MG capsule Take 1 capsule (80 mg total) by mouth 2 (two) times daily with a meal. 03/26/12   Waylan Boga, NP   BP 136/81  Pulse 91  Temp(Src) 98.6 F (37 C) (Oral)  Resp 18  Ht 5\' 4"  (1.626 m)  Wt 212 lb (96.163 kg)  BMI 36.37 kg/m2  SpO2 100%  LMP 09/17/2013 Physical Exam  Nursing note and vitals reviewed. Constitutional: She is oriented to person, place, and time. She appears well-developed and well-nourished.  Non-toxic appearance.  HENT:  Head: Normocephalic.  Right Ear: Tympanic membrane and external ear normal.  Left Ear: Tympanic membrane and external ear normal.  There is no pain or redness of the external ear. The extra auditory canals are essentially clear. There appears to be some fluid behind the left tympanic membrane. There is mild to moderate nasal congestion present. There is mild increased redness of the posterior pharynx. The uvula is slightly swollen. The airway is patent.  Eyes: EOM and lids are normal. Pupils are equal, round, and reactive to light.  Neck: Normal  range of motion. Neck supple. Carotid bruit is not present.  No carotid bruit appreciated.  Cardiovascular: Normal rate, regular rhythm, normal heart sounds, intact distal pulses and normal pulses.   Pulmonary/Chest: Breath sounds normal. No respiratory distress.  Abdominal: Soft. Bowel sounds are normal. There is no tenderness. There is no guarding.  Musculoskeletal: Normal range of motion.  Lymphadenopathy:       Head (right side): No submandibular adenopathy present.       Head (left side): No submandibular adenopathy present.    She has no cervical adenopathy.  Neurological: She is alert and oriented to person, place, and time. She has normal strength. No cranial nerve deficit or sensory deficit.  Patient states she has a sensation of lightheadedness in the room spinning when she changes positions.  Skin: Skin is warm and dry.  Psychiatric: She has a normal mood and affect. Her speech is normal.    ED Course  Procedures (including critical care time) Labs Review Labs Reviewed - No data to display  Imaging Review No results found.   EKG Interpretation None      MDM The examination is consistent with otalgia of the left year, upper respiratory infection, and vertigo. The patient will be treated with Keflex,  Tylenol codeine, and Antivert. Patient is advised to follow up with her primary physician for additional evaluation and management.    10/04/2013-5:35 PM. The patient called to the hospital to state that she felt that her throat was itchy and feeling funny and she took her Keflex. She has taken a Benadryl and is beginning to calm down. The patient requests another medication to replace the Keflex.  I have advised the nurse to instruct the patient to stop the Keflex. To use Benadryl and icy liquids. To return to the emergency department if any changes in breathing or difficulty swallowing or deterioration in condition. A prescription for clindamycin 150 mg 1 3 times a day #21  has been called into the [facility].    Final diagnoses:  Otalgia of left ear  Vertigo  URI (upper respiratory infection)    *I have reviewed nursing notes, vital signs, and all appropriate lab and imaging results for this patient.Lenox Ahr, PA-C 10/04/13 [place]

## 2013-10-03 NOTE — Discharge Instructions (Signed)
Upper Respiratory Infection, Adult An upper respiratory infection (URI) is also known as the common cold. It is often caused by a type of germ (virus). Colds are easily spread (contagious). You can pass it to others by kissing, coughing, sneezing, or drinking out of the same glass. Usually, you get better in 1 or 2 weeks.  HOME CARE   Only take medicine as told by your doctor.  Use a warm mist humidifier or breathe in steam from a hot shower.  Drink enough water and fluids to keep your pee (urine) clear or pale yellow.  Get plenty of rest.  Return to work when your temperature is back to normal or as told by your doctor. You may use a face mask and wash your hands to stop your cold from spreading. GET HELP RIGHT AWAY IF:   After the first few days, you feel you are getting worse.  You have questions about your medicine.  You have chills, shortness of breath, or brown or red spit (mucus).  You have yellow or brown snot (nasal discharge) or pain in the face, especially when you bend forward.  You have a fever, puffy (swollen) neck, pain when you swallow, or white spots in the back of your throat.  You have a bad headache, ear pain, sinus pain, or chest pain.  You have a high-pitched whistling sound when you breathe in and out (wheezing).  You have a lasting cough or cough up blood.  You have sore muscles or a stiff neck. MAKE SURE YOU:   Understand these instructions.  Will watch your condition.  Will get help right away if you are not doing well or get worse. Document Released: 09/19/2007 Document Revised: 06/25/2011 Document Reviewed: 08/07/2010 [facility] Patient Information 2015 [place], [place]. This information is not intended to replace advice given to you by your health care provider. Make sure you discuss any questions you have with your health care provider.  Vertigo Vertigo means you feel like you are moving when you are not. Vertigo can make you feel like things  around you are moving when they are not. This problem often goes away on its own.  HOME CARE   Follow your doctor's instructions.  Avoid driving.  Avoid using heavy machinery.  Avoid doing any activity that could be dangerous if you have a vertigo attack.  Tell your doctor if a medicine seems to cause your vertigo. GET HELP RIGHT AWAY IF:   Your medicines do not help or make you feel worse.  You have trouble talking or walking.  You feel weak or have trouble using your arms, hands, or legs.  You have bad headaches.  You keep feeling sick to your stomach (nauseous) or throwing up (vomiting).  Your vision changes.  A family member notices changes in your behavior.  Your problems get worse. MAKE SURE YOU:  Understand these instructions.  Will watch your condition.  Will get help right away if you are not doing well or get worse. Document Released: 01/10/2008 Document Revised: 06/25/2011 Document Reviewed: 10/19/2010 [facility] Patient Information 2015 [place], [place]. This information is not intended to replace advice given to you by your health care provider. Make sure you discuss any questions you have with your health care provider.

## 2013-10-04 NOTE — ED Provider Notes (Signed)
Medical screening examination/treatment/procedure(s) were performed by non-physician practitioner and as supervising physician I was immediately available for consultation/collaboration.   EKG Interpretation None        Maudry Diego, MD 10/04/13 2053

## 2013-10-04 NOTE — ED Notes (Signed)
Pt called today to request antibiotic(Cephalexin)  to be changed due to pt experiencing nausea, mild SOB and as if throat was closing, pt instructed to take benadryl and new antibiotic called in Clindamycin 150mg  TID PO with food #21 per Lily Kocher, PA to [facility], pt also instructed to come to ED if SOB worsen and/or any new concerns

## 2013-10-05 NOTE — ED Provider Notes (Signed)
Pt called ED. Apparently allergic to clindamycin as well. Message left for patient at [phone] that new prescription for azithromycin pack called into [place].   Virgel Manifold, MD 10/05/13 [phone]

## 2013-10-14 ENCOUNTER — Ambulatory Visit (INDEPENDENT_AMBULATORY_CARE_PROVIDER_SITE_OTHER): Payer: Medicare Other | Admitting: Internal Medicine

## 2013-10-14 ENCOUNTER — Encounter: Payer: Self-pay | Admitting: Internal Medicine

## 2013-10-14 VITALS — BP 128/99 | HR 79 | Temp 99.1°F | Ht 64.0 in | Wt 223.8 lb

## 2013-10-14 DIAGNOSIS — R197 Diarrhea, unspecified: Secondary | ICD-10-CM

## 2013-10-14 DIAGNOSIS — J329 Chronic sinusitis, unspecified: Secondary | ICD-10-CM | POA: Insufficient documentation

## 2013-10-14 DIAGNOSIS — Z23 Encounter for immunization: Secondary | ICD-10-CM | POA: Diagnosis present

## 2013-10-14 DIAGNOSIS — E119 Type 2 diabetes mellitus without complications: Secondary | ICD-10-CM

## 2013-10-14 DIAGNOSIS — J0120 Acute ethmoidal sinusitis, unspecified: Secondary | ICD-10-CM | POA: Diagnosis not present

## 2013-10-14 HISTORY — DX: Diarrhea, unspecified: R19.7

## 2013-10-14 LAB — GLUCOSE, CAPILLARY: GLUCOSE-CAPILLARY: 136 mg/dL — AB (ref 70–99)

## 2013-10-14 MED ORDER — ACETAMINOPHEN-CODEINE #3 300-30 MG PO TABS: 1.0000 | ORAL_TABLET | Freq: Four times a day (QID) | ORAL | Status: DC | PRN

## 2013-10-14 NOTE — Progress Notes (Signed)
Subjective:    Patient ID: [name], female    DOB: 1965-06-27, 48 y.o.   MRN: [id number]  HPI Comments: [name] is a 48 year old female with history of type 2 diabetes, bipolar 1, hypertension, diastolic heart failure, and COPD presenting with a headache since Monday.    She was seen in the ER for otalgia and prescribed antibiotics for an ear infections.  She ultimately completed a course of azithromycin last week that resolved her ear pain.  She also reports diarrhea since Monday.  She though she was dehydrated and that is was possibly contributing to her headaches.  She has been drinking 4-5 16 oz bottles of water with Crystal Light to stay hydrated since yesterday.  She took Imodium twice yesterday, which stopped the diarrhea.  Headache  This is a new problem. Episode onset: Monday. The problem occurs constantly. The problem has been gradually worsening. The pain is located in the frontal and bilateral region. The pain does not radiate. The pain quality is similar to prior headaches (Similar to previous migraines.). The quality of the pain is described as aching. The pain is at a severity of 9/10. The pain is severe. Associated symptoms include dizziness and eye pain. Pertinent negatives include no coughing, drainage, ear pain (Had earache two weeks ago, but relieved by antibiotics.), fever, nausea, neck pain or sore throat. Exacerbated by: Worse when bends over. Treatments tried: Nasal irrigation. The treatment provided moderate (Taking Motrin twice a day.) relief. Her past medical history is significant for hypertension, migraine headaches (Last had 15 years ago.) and obesity. There is no history of cancer, cluster headaches, migraines in the family or recent head traumas.      Review of Systems  Constitutional: Negative for fever.  HENT: Negative for ear pain (Had earache two weeks ago, but relieved by antibiotics.) and sore throat.   Eyes: Positive for pain.    Respiratory: Negative for cough.   Gastrointestinal: Positive for diarrhea. Negative for nausea and constipation.  Genitourinary: Negative.   Musculoskeletal: Negative for neck pain.  Neurological: Positive for dizziness and headaches.       Objective:   Physical Exam  Constitutional: She is oriented to person, place, and time. She appears well-developed and well-nourished. She appears distressed.  Tired appearing.  HENT:  Head: Normocephalic and atraumatic.  Right Ear: External ear normal.  Left Ear: External ear normal.  Nose: Nose normal.  Mouth/Throat: Oropharynx is clear and moist. No oropharyngeal exudate.  Suboccipital tenderness to palpation.  Eyes: Conjunctivae and EOM are normal. Pupils are equal, round, and reactive to light. Right eye exhibits no discharge. Left eye exhibits no discharge. No scleral icterus.  Neck: Normal range of motion. No tracheal deviation present.  Cardiovascular: Normal rate, regular rhythm, normal heart sounds and intact distal pulses.  Exam reveals no gallop and no friction rub.   No murmur heard. Pulmonary/Chest: Effort normal and breath sounds normal. No respiratory distress. She has no wheezes. She has no rales. She exhibits no tenderness.  Abdominal: Soft. She exhibits no distension and no mass. There is tenderness (Slight to deep palpation.). There is no rebound and no guarding.  Musculoskeletal: Normal range of motion. She exhibits no edema and no tenderness.  Lymphadenopathy:    She has no cervical adenopathy.  Neurological: She is alert and oriented to person, place, and time. She has normal reflexes. No cranial nerve deficit.  Skin: Skin is warm and dry. She is not diaphoretic.  Assessment & Plan:  Please see problem-based assessment and plan.

## 2013-10-14 NOTE — Assessment & Plan Note (Signed)
Most consistent with acute viral sinusitis given recent antibiotic usage and lack of nasal discharge.  She says the headaches are different than migraines she had in the past, and she denies sensitivity to light or nausea.  Motrin helped, but I would like to avoid NSAIDs given her history of gastritis. -Recommend supportive care and follow up in one week if her symptoms don't resolve. -Refilled Tylenol #3 to help with acute pain. -Continue nasal irrigation as needed.

## 2013-10-14 NOTE — Patient Instructions (Signed)
Thank you for seeing [name] in clinic today [name].  As we discussed today: -Your symptoms are most consistent with a viral sinusitis.  This should resolve on its own in a week or so. -To help with the pain, take Tylenol #3 as need up every 6 hours. -Continue to do nasal irrigation to help decrease the congestion in your sinuses. -Come back to see [name] in a week if your symptoms do not improve.

## 2013-10-14 NOTE — Assessment & Plan Note (Signed)
Diarrhea may be related to recent antibiotic usage or may be viral.  It resolved with Imodium. -Continue to use Imodium as needed for diarrhea. -Encouraged continued hydration.  Crystal Light is okay.

## 2013-10-19 ENCOUNTER — Emergency Department (INDEPENDENT_AMBULATORY_CARE_PROVIDER_SITE_OTHER)
Admission: EM | Admit: 2013-10-19 | Discharge: 2013-10-19 | Disposition: A | Discharge status: Home / Self Care | Payer: Medicare Other | Source: Home / Self Care | Attending: Family Medicine | Admitting: Family Medicine

## 2013-10-19 ENCOUNTER — Encounter (HOSPITAL_COMMUNITY): Payer: Self-pay | Admitting: Emergency Medicine

## 2013-10-19 ENCOUNTER — Telehealth: Payer: Self-pay | Admitting: *Deleted

## 2013-10-19 DIAGNOSIS — Z91038 Other insect allergy status: Secondary | ICD-10-CM

## 2013-10-19 MED ORDER — FLUTICASONE PROPIONATE 0.05 % EX CREA: TOPICAL_CREAM | Freq: Two times a day (BID) | CUTANEOUS | Status: DC

## 2013-10-19 MED ORDER — TRIAMCINOLONE ACETONIDE 40 MG/ML IJ SUSP
INTRAMUSCULAR | Status: AC
Start: 2013-10-19 — End: 2013-10-19
  Filled 2013-10-19: qty 1

## 2013-10-19 MED ORDER — TRIAMCINOLONE ACETONIDE 40 MG/ML IJ SUSP
40.0000 mg | Freq: Once | INTRAMUSCULAR | Status: AC
Start: 2013-10-19 — End: 2013-10-19
  Administered 2013-10-19: 40 mg via INTRAMUSCULAR

## 2013-10-19 NOTE — ED Notes (Signed)
Insect bite to lower legs and bites to both forearms per patient.  Onset Friday night while at the beach.  Other family members that went to the beach have the same

## 2013-10-19 NOTE — Telephone Encounter (Signed)
Agree with plan 

## 2013-10-19 NOTE — Telephone Encounter (Signed)
Pt called stating she went out on the beach after it rained  and she now has about 20 bites on arms and legs.  Onset 4 days ago and areas not better.  Seem to be spreading.  C/o itching and burning.  She has tried rubbing with Alcohol and Fluocinonide cream.  No relief.    Will see patient tomorrow morning at 9:15

## 2013-10-19 NOTE — ED Provider Notes (Signed)
CSN: [id number]     Arrival date & time 10/19/13  1928 History   First MD Initiated Contact with Patient 10/19/13 2024     Chief Complaint  Patient presents with  . Insect Bite   (Consider location/radiation/quality/duration/timing/severity/associated sxs/prior Treatment) Patient is a 48 y.o. female presenting with rash. The history is provided by the patient.  Rash Location:  Shoulder/arm and leg Shoulder/arm rash location:  L forearm and R forearm Leg rash location:  L lower leg and R lower leg Quality: itchiness and redness   Duration:  3 days Progression:  Unchanged Chronicity:  New Context: insect bite/sting   Relieved by:  None tried Worsened by:  Nothing tried Associated symptoms: no abdominal pain, no fever and no shortness of breath     Past Medical History  Diagnosis Date  . Arteriosclerotic cardiovascular disease (ASCVD)     Minimal at cath in [facility].stress nuclear study in 8/08 with nl EF; neg stress echo in 2010  . Diabetes mellitus, type 2 2000    Onset in 2000; no insulin  . Hyperlipidemia   . Hypertension `    during treatment with Geodon  . Gastroesophageal reflux disease     Schatzki's ring  . Anemia, iron deficiency   . Alcohol abuse   . Depression   . Community acquired pneumonia 01/03/10, 05/2010, 04/2012    2011; with pleural effusion-[facility] acute resp failure; intubated in Jan 2014 (HMPV pneumonia)  . Obesity   . Schizoaffective disorder     requiring multiple psychiatric admissions  . Dysphagia   . Diastolic dysfunction     grade 2 per echo 2011  . Pulmonary hypertension 05/02/2012    Patient needs repeat echo in 06/2012   . History of alcohol abuse 07/22/2007    Qualifier: Diagnosis of  By: Lenn Cal     Past Surgical History  Procedure Laterality Date  . Dilation and curettage, diagnostic / therapeutic  1992  . Esophagogastroduodenoscopy  09/16/08    Dr. Trevor Iha hiatal hernia/excoriations involving the cardia and  mucosa consistent with trauma, antral erosions  of linear petechiae ? gastritis versus early gastric antral vascular  ectasia.Marland Kitchen biopsy showed reactive gastropathy. No H. pylori.  . Esophagogastroduodenoscopy  09/2007    Dr. Evalee Mutton ring, dilated to 27 French Maloney dilator, small hiatal hernia, antral erosions, biopsies reactive gastropathy.  Azzie Almas dilation  07/17/2011    [facility] sedation-->distal esophageal stricture s/p dilation, chronic gastritis, multiple ulcers in stomach. no h.pylori  . Colonoscopy  01/2006    internal hemorrhoids  . Colonoscopy  01/10/2012    Procedure: COLONOSCOPY;  Surgeon: Daneil Dolin, MD;  Location: [facility];  Service: Endoscopy;  Laterality: N/A;  entered cecum @ [phone] ; total cecal withdrawal time = 8 minutes   Family History  Problem Relation Age of Onset  . Colon cancer Other   . Hypertension Mother   . Stroke Father     deceased at age 54  . Heart disease Sister   . Anesthesia problems Neg Hx   . Hypotension Neg Hx   . Malignant hyperthermia Neg Hx   . Pseudochol deficiency Neg Hx    History  Substance Use Topics  . Smoking status: Heavy Tobacco Smoker -- 1.50 packs/day for 30 years    Types: Cigarettes    Start date: 05/18/2012  . Smokeless tobacco: Never Used     Comment: Has almostquit totally./ ABOUT PPD  . Alcohol Use: No   OB History  Grav Para Term Preterm Abortions TAB SAB Ect Mult Living   3 2 2  1 1    2      Review of Systems  Constitutional: Negative.  Negative for fever.  Respiratory: Negative for shortness of breath.   Gastrointestinal: Negative for abdominal pain.  Skin: Positive for rash.    Allergies  Cephalexin; Metronidazole; Orange; Shrimp; Penicillins; Sulfonamide derivatives; Glipizide; and Sulfamethoxazole-trimethoprim  Home Medications   Prior to Admission medications   Medication Sig Start Date End Date Taking? Authorizing Provider  acetaminophen-codeine (TYLENOL #3) 300-30 MG per tablet Take 1-2  tablets by mouth every 6 (six) hours as needed for moderate pain. 10/14/13   Arman Filter, MD  albuterol (PROVENTIL HFA;VENTOLIN HFA) 108 (90 BASE) MCG/ACT inhaler Inhale 2 puffs into the lungs 3 (three) times daily as needed for wheezing or shortness of breath. 10/27/12   Rigoberto Noel, MD  Ascorbic Acid (VITAMIN C) 1000 MG tablet Take 1,000 mg by mouth daily.    Historical Provider, MD  esomeprazole (NEXIUM) 20 MG capsule Take 20 mg by mouth daily at 12 noon.    Historical Provider, MD  Ferrous Sulfate (IRON) 28 MG TABS Take 1 tablet by mouth daily.    Historical Provider, MD  fluticasone (CUTIVATE) 0.05 % cream Apply topically 2 (two) times daily. 10/19/13   Billy Fischer, MD  furosemide (LASIX) 20 MG tablet Take 1 tablet (20 mg total) by mouth daily. 09/23/13 09/23/14  Juluis Mire, MD  guaiFENesin-dextromethorphan (ROBITUSSIN DM) 100-10 MG/5ML syrup Take 5 mLs by mouth every 4 (four) hours as needed for cough. 09/04/13   Carter Kitten, MD  hydrOXYzine (ATARAX/VISTARIL) 10 MG tablet Take 10 mg by mouth 2 (two) times daily as needed for anxiety.     Historical Provider, MD  ibuprofen (ADVIL,MOTRIN) 200 MG tablet Take 400 mg by mouth every 8 (eight) hours as needed. Aches and pains, fever    Historical Provider, MD  Insulin Glargine (LANTUS SOLOSTAR) 100 UNIT/ML SOPN Inject 24 Units into the skin at bedtime.     Historical Provider, MD  lisinopril (PRINIVIL,ZESTRIL) 5 MG tablet Take 2 tablets (10 mg total) by mouth daily. 09/24/13   Blain Pais, MD  loratadine-pseudoephedrine (CLARITIN-D 24-HOUR) 10-240 MG per 24 hr tablet Take 1 tablet by mouth daily as needed for allergies.    Historical Provider, MD  lovastatin (MEVACOR) 20 MG tablet Take 1 tablet (20 mg total) by mouth at bedtime. 09/01/13   Neema Bobbie Stack, MD  meclizine (ANTIVERT) 50 MG tablet Take 1 tablet (50 mg total) by mouth 3 (three) times daily as needed. 10/03/13   Lenox Ahr, PA-C  metFORMIN (GLUCOPHAGE) 1000 MG tablet Take 1,000  mg by mouth 2 (two) times daily with a meal.    Historical Provider, MD  metoprolol (LOPRESSOR) 50 MG tablet Take 50 mg by mouth 2 (two) times daily.    Historical Provider, MD  nicotine (NICODERM CQ - DOSED IN MG/24 HOURS) 21 mg/24hr patch Place 1 patch (21 mg total) onto the skin daily. 09/23/13   Juluis Mire, MD  ziprasidone (GEODON) 80 MG capsule Take 1 capsule (80 mg total) by mouth 2 (two) times daily with a meal. 03/26/12   Waylan Boga, NP   BP 150/93  Pulse 92  Temp(Src) 99.5 F (37.5 C) (Oral)  Resp 16  SpO2 99%  LMP 09/17/2013 Physical Exam  Nursing note and vitals reviewed. Constitutional: She is oriented to person, place, and time. She appears well-developed and well-nourished.  No distress.  Neurological: She is alert and oriented to person, place, and time.  Skin: Skin is warm and dry. Rash noted.  Mult erythem lesions scattered on ext c/w insect bites, pruritic    ED Course  Procedures (including critical care time) Labs Review Labs Reviewed - No data to display  Imaging Review No results found.   MDM   1. Allergy to insect bites        Billy Fischer, MD 10/19/13 2036

## 2013-10-20 ENCOUNTER — Ambulatory Visit: Payer: Self-pay | Admitting: Internal Medicine

## 2013-10-20 ENCOUNTER — Encounter: Payer: Self-pay | Admitting: Internal Medicine

## 2013-10-20 NOTE — Progress Notes (Signed)
INTERNAL MEDICINE TEACHING ATTENDING ADDENDUM - Aldine Contes, MD: I personally saw and evaluated Ms. Crespo in this clinic visit in conjunction with the resident, Dr. Trudee Kuster. I have discussed patient's plan of care with medical resident during this visit. I have confirmed the physical exam findings and have read and agree with the clinic note including the plan with the following addition: - Pt with only minimal maxillary sinus tenderness, no frontal/ethmoidal sinus tenderness - Continue with saline nasal irrigation. No further abx for now - c/w tylenol #3 for pain

## 2013-10-21 ENCOUNTER — Encounter: Payer: Self-pay | Admitting: Internal Medicine

## 2013-10-21 ENCOUNTER — Ambulatory Visit (INDEPENDENT_AMBULATORY_CARE_PROVIDER_SITE_OTHER): Payer: Medicare Other | Admitting: Internal Medicine

## 2013-10-21 VITALS — BP 131/81 | HR 82 | Temp 99.0°F | Wt 224.2 lb

## 2013-10-21 DIAGNOSIS — K3189 Other diseases of stomach and duodenum: Secondary | ICD-10-CM | POA: Diagnosis not present

## 2013-10-21 DIAGNOSIS — M7989 Other specified soft tissue disorders: Secondary | ICD-10-CM | POA: Diagnosis not present

## 2013-10-21 DIAGNOSIS — R1013 Epigastric pain: Secondary | ICD-10-CM | POA: Diagnosis not present

## 2013-10-21 DIAGNOSIS — T148 Other injury of unspecified body region: Secondary | ICD-10-CM | POA: Diagnosis not present

## 2013-10-21 DIAGNOSIS — E119 Type 2 diabetes mellitus without complications: Secondary | ICD-10-CM | POA: Diagnosis not present

## 2013-10-21 DIAGNOSIS — R197 Diarrhea, unspecified: Secondary | ICD-10-CM

## 2013-10-21 DIAGNOSIS — W57XXXA Bitten or stung by nonvenomous insect and other nonvenomous arthropods, initial encounter: Secondary | ICD-10-CM | POA: Insufficient documentation

## 2013-10-21 NOTE — Assessment & Plan Note (Signed)
Insect bites on legs b/l are resolving. Pt to continue using fluticasone 0.5 mg applied topically to bites.

## 2013-10-21 NOTE — Assessment & Plan Note (Signed)
Pt with 1+ pitting edema b/l. Well's score of 1, thus not concerned for DVT. Pt notes that swelling is going down. Thus insect bites could be source of leg swelling or possibly insufficient dose of lasix being giving. Will f/u in clinic in 4 weeks after diarrhea and insect bites fully resolve to recheck weight and readdress CHF meds.

## 2013-10-21 NOTE — Patient Instructions (Signed)
It was nice meeting you today. We will check some blood work and we call you with the results if they are not normal. Please contact the clinic if your diarrhea does not get better. Otherwise we will follow up in 4 weeks for your leg swelling. If your menstrual cycle remains irregular please contact us so that we can give you a referral to an OB/gyn. Have a great day.

## 2013-10-21 NOTE — Assessment & Plan Note (Signed)
Pt states that diarrhea is resolving down to 3 bowel movements a day. Pt instructed to take OTC probiotics or get activa yogurt. Stressed importance of staying hydrated.

## 2013-10-21 NOTE — Progress Notes (Signed)
   Subjective:    Patient ID: [name], female    DOB: 23-Apr-1965, 48 y.o.   MRN: [id number]  Diarrhea  Pertinent negatives include no coughing or headaches.   Pt is a 48 y/o female w/ PMH of HTN, tobacco abuse (smokes 1 ppd), diastolic HF, COPD, and bipolar d/o who presents to clinic for f/u on insect bites and diarrhea. Pt was seen in the ED 6/20 for lt otalgia and was given a zpack. After finishing her course she developed diarrhea w/ 5-6 BMs that were runny in consistency. BMs responded to immodium. However she stopped taking immodium and now is currently having 3 BMs a day. Pt feeling dehydrated.   She was seen in the ED yesterday for insect bits that [place] the [place]. She received a steroid injection and topical 0.05 fluticasone which she states hae helped. She is also complaining of leg swelling that have since been decreasing in swelling since insect bites she got yesterday. Of note she also states that she was on her menstrual cycle during last clinic visit 7/1, and is now on her menstrual cycle again.    Review of Systems  Constitutional: Positive for fatigue. Negative for activity change and appetite change.  HENT: Negative.   Eyes: Eye redness: dependent on glucose levels.  Respiratory: Negative for cough and shortness of breath.   Cardiovascular: Positive for leg swelling.  Gastrointestinal: Positive for diarrhea. Negative for blood in stool.  Neurological: Positive for weakness. Negative for dizziness, light-headedness and headaches.       Objective:   Physical Exam  Constitutional: She is oriented to person, place, and time. She appears well-developed and well-nourished. No distress.  HENT:  Head: Normocephalic and atraumatic.  Neck: Normal range of motion. Neck supple.  Cardiovascular: Normal rate and regular rhythm.  Exam reveals no gallop and no friction rub.   No murmur heard. Pulmonary/Chest: Effort normal and breath sounds normal. No respiratory  distress. She has no wheezes. She has no rales.  Abdominal: Soft. Bowel sounds are normal. There is no tenderness. There is no rebound and no guarding.  Musculoskeletal: She exhibits edema.  1+ pitting edema b/l   Neurological: She is alert and oriented to person, place, and time.  Skin: Skin is warm and dry. She is not diaphoretic.          Assessment & Plan:  Please see problem based assessment for plan

## 2013-10-22 LAB — BASIC METABOLIC PANEL
BUN: 8 mg/dL (ref 6–23)
CHLORIDE: 104 meq/L (ref 96–112)
CO2: 25 mEq/L (ref 19–32)
Calcium: 9.1 mg/dL (ref 8.4–10.5)
Creat: 0.84 mg/dL (ref 0.50–1.10)
Glucose, Bld: 164 mg/dL — ABNORMAL HIGH (ref 70–99)
POTASSIUM: 3.7 meq/L (ref 3.5–5.3)
Sodium: 136 mEq/L (ref 135–145)

## 2013-10-26 NOTE — Progress Notes (Signed)
I saw and evaluated the patient.  I personally confirmed the key portions of the history and exam documented by Dr. Hulen Luster and I reviewed pertinent patient test results.  The assessment, diagnosis, and plan were formulated together and I agree with the documentation in the resident's note.

## 2013-10-27 ENCOUNTER — Other Ambulatory Visit: Payer: Self-pay | Admitting: Internal Medicine

## 2013-10-27 DIAGNOSIS — F319 Bipolar disorder, unspecified: Secondary | ICD-10-CM | POA: Diagnosis not present

## 2013-11-09 ENCOUNTER — Telehealth: Payer: Self-pay | Admitting: Internal Medicine

## 2013-11-09 NOTE — Telephone Encounter (Signed)
Pt is nausea/vomiting/trouble keeping food down. Please call [phone]

## 2013-11-11 NOTE — Telephone Encounter (Signed)
Routing to Susan. 

## 2013-11-11 NOTE — Telephone Encounter (Signed)
OV has been made for 8/6 at 1130 with AS but no answer when JL tried calling her. Pt was told to call us back to confirm that she got the message. Earlier when patient called and spoke with CS OV was made for 9/1 at 230 with AS and this was our next available.  I didn't cancel the 12/15/13 OV only because we haven't been able to reach patient with new appointment.

## 2013-11-11 NOTE — Telephone Encounter (Signed)
Patient called here wanting something for her nausea and vomiting, I made patient aware that we cannot call anything in for her since she has not been seen since 12/2011 for rectal bleeding pt canceled 2 appts in march and since she was having new problems she would need a office visit, pt is scheduled for 12/15/2013 at 2:30 with Laban Emperor NP. Patient wanted to know why she couldn't be seen sooner because we have urgent spots and ask what would be considered urgent, I made patient aware that I can call her if we have any cancellations and she could see her PCP or go to the ER until her appt if she gets worse. Patient asked what is consider urgent I made patient aware that urgent spots are for someone who needs to be seen urgently if they are having rectal bleeding or if a doctor sends them to the office to be seen urgently. Patient asked what was my role I made patient aware that I am a office assistant and patient wanted to speak with Dr. Roseanne Kaufman nurse, I made patient aware that Almyra Free is with a patient and I can send her a message and she can call her back. I made patient aware that I will call her if we have any cancellations, pt agreed verbally. Please Advise [phone]

## 2013-11-11 NOTE — Telephone Encounter (Signed)
Put pt in an urgent spot for next week, tried to call her and had to leave her a message. Asked her to call back and confirm that she had gotten the message.

## 2013-11-12 ENCOUNTER — Encounter: Payer: Self-pay | Admitting: *Deleted

## 2013-11-12 ENCOUNTER — Telehealth: Payer: Self-pay | Admitting: *Deleted

## 2013-11-12 NOTE — Telephone Encounter (Signed)
I called patient for her new appt 11/19/13 at 11:30 with Laban Emperor NP. Pt is aware of the appt and verbally agreed. Appt card and letter was mailed to the patient.

## 2013-11-12 NOTE — Telephone Encounter (Signed)
noted 

## 2013-11-19 ENCOUNTER — Ambulatory Visit (INDEPENDENT_AMBULATORY_CARE_PROVIDER_SITE_OTHER): Payer: Medicare Other | Admitting: Gastroenterology

## 2013-11-19 ENCOUNTER — Encounter: Payer: Self-pay | Admitting: Gastroenterology

## 2013-11-19 ENCOUNTER — Other Ambulatory Visit: Payer: Self-pay | Admitting: *Deleted

## 2013-11-19 VITALS — BP 131/81 | HR 87 | Temp 98.4°F | Resp 18 | Ht 64.0 in | Wt 216.0 lb

## 2013-11-19 DIAGNOSIS — K3189 Other diseases of stomach and duodenum: Secondary | ICD-10-CM

## 2013-11-19 DIAGNOSIS — R1013 Epigastric pain: Secondary | ICD-10-CM | POA: Diagnosis not present

## 2013-11-19 MED ORDER — ESOMEPRAZOLE MAGNESIUM 40 MG PO CPDR: 40.0000 mg | DELAYED_RELEASE_CAPSULE | Freq: Two times a day (BID) | ORAL | Status: DC

## 2013-11-19 MED ORDER — ONDANSETRON HCL 4 MG PO TABS: 4.0000 mg | ORAL_TABLET | Freq: Three times a day (TID) | ORAL | Status: DC | PRN

## 2013-11-19 NOTE — Progress Notes (Signed)
Went to a cookout a few weeks and ate jalapeno peppers. Caused severe nausea. Nausea as heck. Has to lay down for hours because she doesn't want to throw up. Symptoms present for about 3 weeks. Intermittent nausea, but improved with eating. Drinking a shake twice a day. As soon as eating a meal, nauseated. No vomiting. No dysphagia. Burping a lot, relieves feelings of chest tightness/discomfort. Was taking a large amount of Advil as early as 2 weeks ago. No aspirin powders. Nexium 20 mg capsule daily. Notes early satiety. No melena, rectal bleeding.   April 2013 EGD.

## 2013-11-19 NOTE — Patient Instructions (Signed)
We have scheduled you for an upper endoscopy with Dr. Gala Romney in the near future.  For now, increase Nexium to 1 capsule twice a day. I have sent this to the pharmacy.  I have also sent a nausea medicine to your pharmacy to take as needed.

## 2013-11-20 ENCOUNTER — Other Ambulatory Visit: Payer: Self-pay | Admitting: Internal Medicine

## 2013-11-20 MED ORDER — LISINOPRIL 5 MG PO TABS
10.0000 mg | ORAL_TABLET | Freq: Every day | ORAL | Status: DC
Start: ? — End: 2014-01-19

## 2013-11-22 ENCOUNTER — Encounter: Payer: Self-pay | Admitting: Gastroenterology

## 2013-11-22 NOTE — Progress Notes (Signed)
Referring Provider: Julious Oka, MD Primary Care Physician:  Julious Oka, MD Primary GI: Dr. Gala Romney   Chief Complaint  Patient presents with  . Nausea  . Emesis    HPI:   [name] presents today as a self-referral secondary to intermittent nausea and vomiting. She has a history significant for dysphagia s/p dilations and multiple gastric body ulcers with negative H.pylori in 2013. No surveillance EGD performed.  Went to a cookout a few weeks ago and ate jalapeno peppers. Caused severe nausea. Nauseated as "heck". Has to lay down for hours because she doesn't want to throw up. Symptoms present for about 3 weeks. Intermittent nausea, but initially states sometimes improved with eating. Later in appt stated eating precipitated nausea. Drinking a protein shake twice a day. As soon as eating a meal, nauseated. No vomiting. No dysphagia. Burping a lot, relieves feelings of chest tightness/discomfort. Was taking a large amount of Advil as early as 2 weeks ago. No aspirin powders. Nexium 20 mg capsule daily. Notes early satiety. No melena, rectal bleeding.    Past Medical History  Diagnosis Date  . Arteriosclerotic cardiovascular disease (ASCVD)     Minimal at cath in [facility].stress nuclear study in 8/08 with nl EF; neg stress echo in 2010  . Diabetes mellitus, type 2 2000    Onset in 2000; no insulin  . Hyperlipidemia   . Hypertension `    during treatment with Geodon  . Gastroesophageal reflux disease     Schatzki's ring  . Anemia, iron deficiency   . Alcohol abuse   . Depression   . Community acquired pneumonia 01/03/10, 05/2010, 04/2012    2011; with pleural effusion-[facility] acute resp failure; intubated in Jan 2014 (HMPV pneumonia)  . Obesity   . Schizoaffective disorder     requiring multiple psychiatric admissions  . Dysphagia   . Diastolic dysfunction     grade 2 per echo 2011  . Pulmonary hypertension 05/02/2012    Patient needs repeat echo  in 06/2012   . History of alcohol abuse 07/22/2007    Qualifier: Diagnosis of  By: Lenn Cal      Past Surgical History  Procedure Laterality Date  . Dilation and curettage, diagnostic / therapeutic  1992  . Esophagogastroduodenoscopy  09/16/08    Dr. Trevor Iha hiatal hernia/excoriations involving the cardia and mucosa consistent with trauma, antral erosions  of linear petechiae ? gastritis versus early gastric antral vascular  ectasia.Marland Kitchen biopsy showed reactive gastropathy. No H. pylori.  . Esophagogastroduodenoscopy  09/2007    Dr. Evalee Mutton ring, dilated to 33 French Maloney dilator, small hiatal hernia, antral erosions, biopsies reactive gastropathy.  Azzie Almas dilation  07/17/2011    [facility] sedation-->distal esophageal stricture s/p dilation, chronic gastritis, multiple ulcers in stomach. no h.pylori  . Colonoscopy  01/2006    internal hemorrhoids  . Colonoscopy  01/10/2012    Dr. Rourk:Single anal canal hemorrhoidal tag likely source of  trivial hematochezia; right-sided colonic diverticulosis    Current Outpatient Prescriptions  Medication Sig Dispense Refill  . acetaminophen-codeine (TYLENOL #3) 300-30 MG per tablet Take 1-2 tablets by mouth every 6 (six) hours as needed for moderate pain.  15 tablet  0  . albuterol (PROVENTIL HFA;VENTOLIN HFA) 108 (90 BASE) MCG/ACT inhaler Inhale 2 puffs into the lungs 3 (three) times daily as needed for wheezing or shortness of breath.  1 Inhaler  2  . Ascorbic Acid (VITAMIN C) 1000 MG tablet Take 1,000 mg  by mouth daily.      . Ferrous Sulfate (IRON) 28 MG TABS Take 1 tablet by mouth daily.      . fluticasone (CUTIVATE) 0.05 % cream Apply topically 2 (two) times daily.  60 g  0  . furosemide (LASIX) 20 MG tablet Take 1 tablet (20 mg total) by mouth daily.  30 tablet  2  . guaiFENesin-dextromethorphan (ROBITUSSIN DM) 100-10 MG/5ML syrup Take 5 mLs by mouth every 4 (four) hours as needed for cough.  118 mL  0  . hydrOXYzine  (ATARAX/VISTARIL) 10 MG tablet Take 10 mg by mouth 2 (two) times daily as needed for anxiety.       Marland Kitchen ibuprofen (ADVIL,MOTRIN) 200 MG tablet Take 400 mg by mouth every 8 (eight) hours as needed. Aches and pains, fever      . Insulin Glargine (LANTUS SOLOSTAR) 100 UNIT/ML SOPN Inject 24 Units into the skin at bedtime.       Marland Kitchen loratadine-pseudoephedrine (CLARITIN-D 24-HOUR) 10-240 MG per 24 hr tablet Take 1 tablet by mouth daily as needed for allergies.      Marland Kitchen lovastatin (MEVACOR) 20 MG tablet Take 1 tablet (20 mg total) by mouth at bedtime.  90 tablet  3  . meclizine (ANTIVERT) 50 MG tablet Take 1 tablet (50 mg total) by mouth 3 (three) times daily as needed.  21 tablet  0  . metFORMIN (GLUCOPHAGE) 1000 MG tablet Take 1,000 mg by mouth 2 (two) times daily with a meal.      . metoprolol (LOPRESSOR) 50 MG tablet Take 50 mg by mouth 2 (two) times daily.      . nicotine (NICODERM CQ - DOSED IN MG/24 HOURS) 21 mg/24hr patch Place 1 patch (21 mg total) onto the skin daily.  28 patch  1  . ziprasidone (GEODON) 80 MG capsule Take 1 capsule (80 mg total) by mouth 2 (two) times daily with a meal.  60 capsule  0  . esomeprazole (NEXIUM) 40 MG capsule Take 1 capsule (40 mg total) by mouth 2 (two) times daily before a meal.  60 capsule  3  . lisinopril (PRINIVIL,ZESTRIL) 5 MG tablet Take 2 tablets (10 mg total) by mouth daily.  180 tablet  1  . ondansetron (ZOFRAN) 4 MG tablet Take 1 tablet (4 mg total) by mouth every 8 (eight) hours as needed for nausea or vomiting.  30 tablet  1   No current facility-administered medications for this visit.    Allergies as of 11/19/2013 - Review Complete 11/19/2013  Allergen Reaction Noted  . Cephalexin  10/14/2013  . Metronidazole Shortness Of Breath and Swelling   . Orange Itching 03/27/2011  . Shrimp [shellfish allergy] Shortness Of Breath and Itching 03/27/2011  . Penicillins Hives and Swelling   . Sulfonamide derivatives Hives   . Glipizide Other (See Comments)  01/15/2011  . Sulfamethoxazole-trimethoprim Rash 02/27/2010    Family History  Problem Relation Age of Onset  . Colon cancer Other   . Hypertension Mother   . Stroke Father     deceased at age 54  . Heart disease Sister   . Anesthesia problems Neg Hx   . Hypotension Neg Hx   . Malignant hyperthermia Neg Hx   . Pseudochol deficiency Neg Hx     History   Social History  . Marital Status: Single    Spouse Name: N/A    Number of Children: N/A  . Years of Education: 12   Occupational History  . Disability   .  UNEMPLOYED    Social History Main Topics  . Smoking status: Heavy Tobacco Smoker -- 1.00 packs/day for 30 years    Types: Cigarettes    Start date: 05/18/2012  . Smokeless tobacco: Never Used     Comment: Has almostquit totally./ ABOUT PPD  . Alcohol Use: No     Comment: hx of ETOH abuse, will still drink alcohol socially.   . Drug Use: No  . Sexual Activity: None   Other Topics Concern  . 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   Physical Exam: BP 131/81  Pulse 87  Temp(Src) 98.4 F (36.9 C) (Oral)  Resp 18  Ht 5\' 4"  (1.626 m)  Wt 216 lb (97.977 kg)  BMI 37.06 kg/m2 General:   Alert and oriented. No distress noted. Pleasant and cooperative.  Head:  Normocephalic and atraumatic. Eyes:  Conjuctiva clear without scleral icterus. Mouth:  Oral mucosa pink and moist. Good dentition. No lesions. Heart:  S1, S2 present without murmurs, rubs, or gallops. Regular rate and rhythm. Abdomen:  +BS, soft, non-tender and non-distended. No rebound or guarding. No HSM or masses noted. Msk:  Symmetrical without gross deformities. Normal posture. Extremities:  Without edema. Neurologic:  Alert and   oriented x4;  grossly normal neurologically. Skin:  Intact without significant lesions or rashes. Psych:  Alert and cooperative. Normal mood and affect.

## 2013-11-23 ENCOUNTER — Ambulatory Visit: Payer: Self-pay | Admitting: Internal Medicine

## 2013-11-23 NOTE — Telephone Encounter (Signed)
Called to pharm 

## 2013-11-23 NOTE — Assessment & Plan Note (Addendum)
48 year old female with history of PUD in 2013, negative H.pylori, without surveillance EGD, now with recurrent nausea, vomiting, early satiety in the setting of recent significant NSAID use. Likely gastritis, PUD as culprit. Unable to exclude gastroparesis with history of diabetes. Increase Nexium to 40 mg BID, add Zofran for supportive measures, and proceed with EGD with Dr. Gala Romney in the near future. The risks and benefits were discussed in detail with stated understanding. WILL UTILIZE PROPOFOL DUE TO POLYPHARMACY.

## 2013-11-29 ENCOUNTER — Encounter (HOSPITAL_COMMUNITY): Payer: Self-pay | Admitting: Emergency Medicine

## 2013-11-29 DIAGNOSIS — R109 Unspecified abdominal pain: Secondary | ICD-10-CM | POA: Insufficient documentation

## 2013-11-29 DIAGNOSIS — E119 Type 2 diabetes mellitus without complications: Secondary | ICD-10-CM | POA: Diagnosis not present

## 2013-11-29 DIAGNOSIS — F17200 Nicotine dependence, unspecified, uncomplicated: Secondary | ICD-10-CM | POA: Diagnosis not present

## 2013-11-29 DIAGNOSIS — R197 Diarrhea, unspecified: Secondary | ICD-10-CM | POA: Diagnosis not present

## 2013-11-29 DIAGNOSIS — I10 Essential (primary) hypertension: Secondary | ICD-10-CM | POA: Insufficient documentation

## 2013-11-29 DIAGNOSIS — E669 Obesity, unspecified: Secondary | ICD-10-CM | POA: Insufficient documentation

## 2013-11-29 DIAGNOSIS — R112 Nausea with vomiting, unspecified: Secondary | ICD-10-CM | POA: Diagnosis not present

## 2013-11-29 NOTE — ED Notes (Signed)
No answer for room placement from waiting room.

## 2013-11-29 NOTE — ED Notes (Signed)
Pt with cough and abd burning, states abd burning since yesterday, states she has seen blood on tongue today, black stools per pt

## 2013-11-30 ENCOUNTER — Emergency Department (HOSPITAL_COMMUNITY): Payer: Medicare Other

## 2013-11-30 ENCOUNTER — Emergency Department (HOSPITAL_COMMUNITY)
Admission: EM | Admit: 2013-11-30 | Discharge: 2013-11-30 | Discharge status: Against Medical Advice | Payer: Medicare Other | Attending: Emergency Medicine | Admitting: Emergency Medicine

## 2013-11-30 ENCOUNTER — Emergency Department (HOSPITAL_COMMUNITY)
Admission: EM | Admit: 2013-11-30 | Discharge: 2013-11-30 | Disposition: A | Discharge status: Home / Self Care | Payer: Medicare Other | Attending: Emergency Medicine | Admitting: Emergency Medicine

## 2013-11-30 ENCOUNTER — Encounter (HOSPITAL_COMMUNITY): Payer: Self-pay | Admitting: Pharmacy Technician

## 2013-11-30 ENCOUNTER — Telehealth: Payer: Self-pay

## 2013-11-30 ENCOUNTER — Encounter (HOSPITAL_COMMUNITY): Payer: Self-pay | Admitting: Emergency Medicine

## 2013-11-30 DIAGNOSIS — D649 Anemia, unspecified: Secondary | ICD-10-CM | POA: Diagnosis not present

## 2013-11-30 DIAGNOSIS — I10 Essential (primary) hypertension: Secondary | ICD-10-CM | POA: Diagnosis not present

## 2013-11-30 DIAGNOSIS — Z79899 Other long term (current) drug therapy: Secondary | ICD-10-CM | POA: Diagnosis not present

## 2013-11-30 DIAGNOSIS — Z3202 Encounter for pregnancy test, result negative: Secondary | ICD-10-CM | POA: Diagnosis not present

## 2013-11-30 DIAGNOSIS — K219 Gastro-esophageal reflux disease without esophagitis: Secondary | ICD-10-CM | POA: Diagnosis not present

## 2013-11-30 DIAGNOSIS — R197 Diarrhea, unspecified: Secondary | ICD-10-CM | POA: Insufficient documentation

## 2013-11-30 DIAGNOSIS — Z8709 Personal history of other diseases of the respiratory system: Secondary | ICD-10-CM | POA: Insufficient documentation

## 2013-11-30 DIAGNOSIS — Z794 Long term (current) use of insulin: Secondary | ICD-10-CM | POA: Insufficient documentation

## 2013-11-30 DIAGNOSIS — Z9889 Other specified postprocedural states: Secondary | ICD-10-CM | POA: Diagnosis not present

## 2013-11-30 DIAGNOSIS — F17200 Nicotine dependence, unspecified, uncomplicated: Secondary | ICD-10-CM | POA: Insufficient documentation

## 2013-11-30 DIAGNOSIS — IMO0002 Reserved for concepts with insufficient information to code with codable children: Secondary | ICD-10-CM | POA: Diagnosis not present

## 2013-11-30 DIAGNOSIS — E669 Obesity, unspecified: Secondary | ICD-10-CM | POA: Insufficient documentation

## 2013-11-30 DIAGNOSIS — R109 Unspecified abdominal pain: Secondary | ICD-10-CM | POA: Insufficient documentation

## 2013-11-30 DIAGNOSIS — E119 Type 2 diabetes mellitus without complications: Secondary | ICD-10-CM | POA: Diagnosis not present

## 2013-11-30 DIAGNOSIS — F329 Major depressive disorder, single episode, unspecified: Secondary | ICD-10-CM | POA: Insufficient documentation

## 2013-11-30 DIAGNOSIS — E785 Hyperlipidemia, unspecified: Secondary | ICD-10-CM | POA: Insufficient documentation

## 2013-11-30 DIAGNOSIS — F3289 Other specified depressive episodes: Secondary | ICD-10-CM | POA: Insufficient documentation

## 2013-11-30 DIAGNOSIS — F259 Schizoaffective disorder, unspecified: Secondary | ICD-10-CM | POA: Insufficient documentation

## 2013-11-30 DIAGNOSIS — Z880 Allergy status to penicillin: Secondary | ICD-10-CM | POA: Diagnosis not present

## 2013-11-30 DIAGNOSIS — R112 Nausea with vomiting, unspecified: Secondary | ICD-10-CM | POA: Diagnosis not present

## 2013-11-30 LAB — URINALYSIS, ROUTINE W REFLEX MICROSCOPIC
Glucose, UA: NEGATIVE mg/dL
Hgb urine dipstick: NEGATIVE
Ketones, ur: NEGATIVE mg/dL
LEUKOCYTES UA: NEGATIVE
Nitrite: NEGATIVE
Protein, ur: NEGATIVE mg/dL
Specific Gravity, Urine: 1.025 (ref 1.005–1.030)
Urobilinogen, UA: 0.2 mg/dL (ref 0.0–1.0)
pH: 5.5 (ref 5.0–8.0)

## 2013-11-30 LAB — LIPASE, BLOOD: LIPASE: 22 U/L (ref 11–59)

## 2013-11-30 LAB — COMPREHENSIVE METABOLIC PANEL
ALT: 8 U/L (ref 0–35)
AST: 11 U/L (ref 0–37)
Albumin: 3.3 g/dL — ABNORMAL LOW (ref 3.5–5.2)
Alkaline Phosphatase: 72 U/L (ref 39–117)
Anion gap: 12 (ref 5–15)
BUN: 11 mg/dL (ref 6–23)
CALCIUM: 9.2 mg/dL (ref 8.4–10.5)
CO2: 25 mEq/L (ref 19–32)
CREATININE: 0.75 mg/dL (ref 0.50–1.10)
Chloride: 103 mEq/L (ref 96–112)
GFR calc non Af Amer: >90 mL/min (ref >90)
GLUCOSE: 123 mg/dL — AB (ref 70–99)
Potassium: 3.8 mEq/L (ref 3.7–5.3)
Sodium: 140 mEq/L (ref 137–147)
Total Bilirubin: 0.2 mg/dL — ABNORMAL LOW (ref 0.3–1.2)
Total Protein: 6.5 g/dL (ref 6.0–8.3)

## 2013-11-30 LAB — CBC WITH DIFFERENTIAL/PLATELET
Basophils Absolute: 0 10*3/uL (ref 0.0–0.1)
Basophils Relative: 0 % (ref 0–1)
EOS ABS: 0.1 10*3/uL (ref 0.0–0.7)
EOS PCT: 1 % (ref 0–5)
HCT: 31.5 % — ABNORMAL LOW (ref 36.0–46.0)
Hemoglobin: 9.7 g/dL — ABNORMAL LOW (ref 12.0–15.0)
LYMPHS ABS: 2.2 10*3/uL (ref 0.7–4.0)
Lymphocytes Relative: 31 % (ref 12–46)
MCH: 22.2 pg — AB (ref 26.0–34.0)
MCHC: 30.8 g/dL (ref 30.0–36.0)
MCV: 72.2 fL — AB (ref 78.0–100.0)
Monocytes Absolute: 0.5 10*3/uL (ref 0.1–1.0)
Monocytes Relative: 7 % (ref 3–12)
Neutro Abs: 4.3 10*3/uL (ref 1.7–7.7)
Neutrophils Relative %: 61 % (ref 43–77)
Platelets: 273 10*3/uL (ref 150–400)
RBC: 4.36 MIL/uL (ref 3.87–5.11)
RDW: 17.1 % — ABNORMAL HIGH (ref 11.5–15.5)
WBC: 7 10*3/uL (ref 4.0–10.5)

## 2013-11-30 LAB — POC OCCULT BLOOD, ED: Fecal Occult Bld: NEGATIVE

## 2013-11-30 LAB — POC URINE PREG, ED: PREG TEST UR: NEGATIVE

## 2013-11-30 MED ORDER — LOPERAMIDE HCL 2 MG PO CAPS: 2.0000 mg | ORAL_CAPSULE | Freq: Four times a day (QID) | ORAL | Status: DC | PRN

## 2013-11-30 MED ORDER — SODIUM CHLORIDE 0.9 % IV BOLUS (SEPSIS)
1000.0000 mL | Freq: Once | INTRAVENOUS | Status: AC
  Administered 2013-11-30: 1000 mL via INTRAVENOUS

## 2013-11-30 MED ORDER — PANTOPRAZOLE SODIUM 40 MG IV SOLR
40.0000 mg | Freq: Once | INTRAVENOUS | Status: DC
Start: 2013-11-30 — End: 2016-09-06

## 2013-11-30 MED ORDER — ONDANSETRON HCL 4 MG/2ML IJ SOLN
4.0000 mg | Freq: Once | INTRAMUSCULAR | Status: AC
  Administered 2013-11-30: 4 mg via INTRAMUSCULAR
  Filled 2013-11-30: qty 2

## 2013-11-30 MED ORDER — ONDANSETRON 4 MG PO TBDP: 4.0000 mg | ORAL_TABLET | Freq: Three times a day (TID) | ORAL | Status: DC | PRN

## 2013-11-30 MED ORDER — PANTOPRAZOLE SODIUM 40 MG IV SOLR
40.0000 mg | Freq: Once | INTRAVENOUS | Status: AC
  Administered 2013-11-30: 40 mg via INTRAVENOUS
  Filled 2013-11-30: qty 40

## 2013-11-30 MED ORDER — SODIUM CHLORIDE 0.9 % IV SOLN: Freq: Once | INTRAVENOUS | Status: DC

## 2013-11-30 MED ORDER — ONDANSETRON HCL 4 MG/2ML IJ SOLN: 4.0000 mg | Freq: Once | INTRAMUSCULAR | Status: DC

## 2013-11-30 NOTE — ED Provider Notes (Signed)
CSN: [id number]     Arrival date & time 11/30/13  0900 History  This chart was scribed for Rachel Hacker, MD by Ludger Nutting, ED Scribe. This patient was seen in room APA10/APA10 and the patient's care was started 10:03 AM.    Chief Complaint  Patient presents with  . Abdominal Pain    HPI  HPI Comments: [name] is a 48 y.o. female who presents to the Emergency Department complaining of intermittent episodes of nausea, vomiting and diarrhea for the past few days. She reports black stools as well as watery diarrhea. Reports she has been taking pepto bismol without relief.  . She reports associated, mild abdominal discomfort along with decreased appetite. Patient reports occasional SOB that she believes is related to her GI symptoms; she denies SOB currently. She is followed by Dr. Gala Romney for a history of GERD and gastric ulcers and takes Nexium. Patient reports a sick contact with similar symptoms. She denies fever, chest pain. Patient has a history of DM but states she has not monitored her blood sugar levels recently.    Past Medical History  Diagnosis Date  . Arteriosclerotic cardiovascular disease (ASCVD)     Minimal at cath in [facility].stress nuclear study in 8/08 with nl EF; neg stress echo in 2010  . Diabetes mellitus, type 2 2000    Onset in 2000; no insulin  . Hyperlipidemia   . Hypertension `    during treatment with Geodon  . Gastroesophageal reflux disease     Schatzki's ring  . Anemia, iron deficiency   . Alcohol abuse   . Depression   . Community acquired pneumonia 01/03/10, 05/2010, 04/2012    2011; with pleural effusion-[facility] acute resp failure; intubated in Jan 2014 (HMPV pneumonia)  . Obesity   . Schizoaffective disorder     requiring multiple psychiatric admissions  . Dysphagia   . Diastolic dysfunction     grade 2 per echo 2011  . Pulmonary hypertension 05/02/2012    Patient needs repeat echo in 06/2012   . History of alcohol abuse  07/22/2007    Qualifier: Diagnosis of  By: Lenn Cal     Past Surgical History  Procedure Laterality Date  . Dilation and curettage, diagnostic / therapeutic  1992  . Esophagogastroduodenoscopy  09/16/08    Dr. Trevor Iha hiatal hernia/excoriations involving the cardia and mucosa consistent with trauma, antral erosions  of linear petechiae ? gastritis versus early gastric antral vascular  ectasia.Marland Kitchen biopsy showed reactive gastropathy. No H. pylori.  . Esophagogastroduodenoscopy  09/2007    Dr. Evalee Mutton ring, dilated to 38 French Maloney dilator, small hiatal hernia, antral erosions, biopsies reactive gastropathy.  Azzie Almas dilation  07/17/2011    [facility] sedation-->distal esophageal stricture s/p dilation, chronic gastritis, multiple ulcers in stomach. no h.pylori  . Colonoscopy  01/2006    internal hemorrhoids  . Colonoscopy  01/10/2012    Dr. Rourk:Single anal canal hemorrhoidal tag likely source of  trivial hematochezia; right-sided colonic diverticulosis   Family History  Problem Relation Age of Onset  . Colon cancer Other   . Hypertension Mother   . Stroke Father     deceased at age 1  . Heart disease Sister   . Anesthesia problems Neg Hx   . Hypotension Neg Hx   . Malignant hyperthermia Neg Hx   . Pseudochol deficiency Neg Hx    History  Substance Use Topics  . Smoking status: Heavy Tobacco Smoker -- 1.00 packs/day for 30  years    Types: Cigarettes    Start date: 05/18/2012  . Smokeless tobacco: Never Used     Comment: Has almostquit totally./ ABOUT PPD  . Alcohol Use: No     Comment: hx of ETOH abuse, will still drink alcohol socially.    OB History   Grav Para Term Preterm Abortions TAB SAB Ect Mult Living   3 2 2  1 1    2      Review of Systems  Constitutional: Negative for fever.  Respiratory: Positive for shortness of breath. Negative for cough and chest tightness.   Cardiovascular: Negative for chest pain.  Gastrointestinal: Positive for nausea,  vomiting and abdominal pain. Negative for blood in stool.       Dark stools  Genitourinary: Negative for dysuria and decreased urine volume.  Neurological: Negative for headaches.  All other systems reviewed and are negative.     Allergies  Cephalexin; Metronidazole; Orange; Shrimp; Penicillins; Sulfonamide derivatives; Glipizide; and Sulfamethoxazole-trimethoprim  Home Medications   Prior to Admission medications   Medication Sig Start Date End Date Taking? Authorizing Provider  acetaminophen-codeine (TYLENOL #3) 300-30 MG per tablet Take 1-2 tablets by mouth every 6 (six) hours as needed for moderate pain. 10/14/13   Arman Filter, MD  albuterol (PROVENTIL HFA;VENTOLIN HFA) 108 (90 BASE) MCG/ACT inhaler Inhale 2 puffs into the lungs 3 (three) times daily as needed for wheezing or shortness of breath. 10/27/12   Rigoberto Noel, MD  Ascorbic Acid (VITAMIN C) 1000 MG tablet Take 1,000 mg by mouth daily.    Historical Provider, MD  esomeprazole (NEXIUM) 40 MG capsule Take 1 capsule (40 mg total) by mouth 2 (two) times daily before a meal. 11/19/13   Orvil Feil, NP  Ferrous Sulfate (IRON) 28 MG TABS Take 1 tablet by mouth daily.    Historical Provider, MD  fluticasone (CUTIVATE) 0.05 % cream Apply topically 2 (two) times daily. 10/19/13   Billy Fischer, MD  furosemide (LASIX) 20 MG tablet Take 1 tablet (20 mg total) by mouth daily. 09/23/13 09/23/14  Juluis Mire, MD  guaiFENesin-dextromethorphan (ROBITUSSIN DM) 100-10 MG/5ML syrup Take 5 mLs by mouth every 4 (four) hours as needed for cough. 09/04/13   Malena Catholic, MD  hydrOXYzine (ATARAX/VISTARIL) 10 MG tablet Take 10 mg by mouth 2 (two) times daily as needed for anxiety.     Historical Provider, MD  ibuprofen (ADVIL,MOTRIN) 200 MG tablet Take 400 mg by mouth every 8 (eight) hours as needed. Aches and pains, fever    Historical Provider, MD  Insulin Glargine (LANTUS SOLOSTAR) 100 UNIT/ML SOPN Inject 24 Units into the skin at bedtime.      Historical Provider, MD  lisinopril (PRINIVIL,ZESTRIL) 5 MG tablet Take 2 tablets (10 mg total) by mouth daily.    Annia Belt, MD  loperamide (IMODIUM) 2 MG capsule Take 1 capsule (2 mg total) by mouth 4 (four) times daily as needed for diarrhea or loose stools. 11/30/13   Rachel Hacker, MD  loratadine-pseudoephedrine (CLARITIN-D 24-HOUR) 10-240 MG per 24 hr tablet Take 1 tablet by mouth daily as needed for allergies.    Historical Provider, MD  lovastatin (MEVACOR) 20 MG tablet Take 1 tablet (20 mg total) by mouth at bedtime. 09/01/13   Neema Bobbie Stack, MD  meclizine (ANTIVERT) 50 MG tablet Take 1 tablet (50 mg total) by mouth 3 (three) times daily as needed. 10/03/13   Lenox Ahr, PA-C  metFORMIN (GLUCOPHAGE) 1000 MG tablet Take  1,000 mg by mouth 2 (two) times daily with a meal.    Historical Provider, MD  metoprolol (LOPRESSOR) 50 MG tablet Take 50 mg by mouth 2 (two) times daily.    Historical Provider, MD  nicotine (NICODERM CQ - DOSED IN MG/24 HOURS) 21 mg/24hr patch Place 1 patch (21 mg total) onto the skin daily. 09/23/13   Juluis Mire, MD  ondansetron (ZOFRAN ODT) 4 MG disintegrating tablet Take 1 tablet (4 mg total) by mouth every 8 (eight) hours as needed for nausea or vomiting. 11/30/13   Rachel Hacker, MD  ondansetron (ZOFRAN) 4 MG tablet Take 1 tablet (4 mg total) by mouth every 8 (eight) hours as needed for nausea or vomiting. 11/19/13   Orvil Feil, NP  ziprasidone (GEODON) 80 MG capsule Take 1 capsule (80 mg total) by mouth 2 (two) times daily with a meal. 03/26/12   Waylan Boga, NP   BP 136/97  Pulse 72  Temp(Src) 99.6 F (37.6 C) (Oral)  Resp 17  Ht 5\' 4"  (1.626 m)  Wt 215 lb (97.523 kg)  BMI 36.89 kg/m2  SpO2 100%  LMP 11/15/2013 Physical Exam  Nursing note and vitals reviewed. Constitutional: She is oriented to person, place, and time. She appears well-developed and well-nourished. No distress.  HENT:  Head: Normocephalic and atraumatic.   Mouth/Throat: Oropharynx is clear and moist.  Eyes: Pupils are equal, round, and reactive to light.  Cardiovascular: Normal rate, regular rhythm and normal heart sounds.   No murmur heard. Pulmonary/Chest: Effort normal and breath sounds normal. No respiratory distress. She has no wheezes.  Abdominal: Soft. Bowel sounds are normal. There is no tenderness. There is no rebound and no guarding.  Genitourinary:  Normal rectal tone, no gross blood, Hemoccult-negative  Neurological: She is alert and oriented to person, place, and time.  Skin: Skin is warm and dry.  Psychiatric: She has a normal mood and affect.    ED Course  Procedures (including critical care time)  DIAGNOSTIC STUDIES: Oxygen Saturation is 100% on RA, normal by my interpretation.    COORDINATION OF CARE: 10:07 AM Discussed treatment plan with pt at bedside and pt agreed to plan.   Labs Review Labs Reviewed  CBC WITH DIFFERENTIAL - Abnormal; Notable for the following:    Hemoglobin 9.7 (*)    HCT 31.5 (*)    MCV 72.2 (*)    MCH 22.2 (*)    RDW 17.1 (*)    All other components within normal limits  COMPREHENSIVE METABOLIC PANEL - Abnormal; Notable for the following:    Glucose, Bld 123 (*)    Albumin 3.3 (*)    Total Bilirubin 0.2 (*)    All other components within normal limits  URINALYSIS, ROUTINE W REFLEX MICROSCOPIC - Abnormal; Notable for the following:    Bilirubin Urine SMALL (*)    All other components within normal limits  LIPASE, BLOOD  POC URINE PREG, ED  POC OCCULT BLOOD, ED    Imaging Review Dg Chest 2 View  11/30/2013   CLINICAL DATA:  Abdominal pain, nausea, vomiting, history diabetes, hypertension, vascular disease, pulmonary hypertension  EXAM: CHEST  2 VIEW  COMPARISON:  09/15/2013  FINDINGS: Normal heart size, mediastinal contours, and pulmonary vascularity.  Lungs clear.  No pleural effusion or pneumothorax.  Bones unremarkable.  IMPRESSION: Normal exam.   Electronically Signed   By: Lavonia Dana M.D.   On: 11/30/2013 11:22     EKG Interpretation   Date/Time:  Monday  November 30 2013 10:22:15 EDT Ventricular Rate:  75 PR Interval:  112 QRS Duration: 80 QT Interval:  383 QTC Calculation: 428 R Axis:   71 Text Interpretation:  Sinus rhythm Borderline short PR interval Confirmed  by HORTON  MD, COURTNEY ([id number]) on 11/30/2013 10:25:33 AM      MDM   Final diagnoses:  Nausea vomiting and diarrhea    Patient presents with nausea, vomiting, and diarrhea. Known sick contact with similar symptoms. Also reports to stools patient has a history of gastric ulcers but also has been taking Pepto-Bismol.  Hemoccult is negative. Vital signs are stable. Patient was given pain and nausea medication.  Basic labwork obtained. Lab work largely reassuring.  Hemoglobin has dropped from 11.2-9.7 over the last 2 and half months. This is of unknown significance. Patient reports improvement with fluids. She's been able to tolerate by mouth. Discuss with patient that her symptoms are likely related to acute gastroenteritis. NO signs of acute GI bleeding.  Given hemoglobin drop, would have her continue Nexium and followup closely with her gastroenterologist. Patient stated understanding.  After history, exam, and medical workup I feel the patient has been appropriately medically screened and is safe for discharge home. Pertinent diagnoses were discussed with the patient. Patient was given return precautions.  I personally performed the services described in this documentation, which was scribed in my presence. The recorded information has been reviewed and is accurate.   Rachel Hacker, MD 11/30/13 [phone]

## 2013-11-30 NOTE — Telephone Encounter (Signed)
Pt is stated that she has had 10 black stools. She is sick to her stomach. Not much pain but not able to eat much. I advised her to go to the ER to get checked out since she has had some many black stools.

## 2013-11-30 NOTE — ED Notes (Signed)
Patient not in waiting room for room placement. 

## 2013-11-30 NOTE — Telephone Encounter (Signed)
agree

## 2013-11-30 NOTE — ED Notes (Signed)
Tolerating snack and fluids well.

## 2013-11-30 NOTE — Discharge Instructions (Signed)

## 2013-11-30 NOTE — ED Notes (Signed)
Not in waiting room for room placement.

## 2013-11-30 NOTE — ED Notes (Signed)
Pt reports n/v/d, loss of appetite. Describes diarrhea as black watery stools with history of peptic ulcers

## 2013-12-02 ENCOUNTER — Other Ambulatory Visit: Payer: Self-pay | Admitting: Internal Medicine

## 2013-12-07 ENCOUNTER — Ambulatory Visit (INDEPENDENT_AMBULATORY_CARE_PROVIDER_SITE_OTHER): Payer: Medicare Other | Admitting: Internal Medicine

## 2013-12-07 VITALS — BP 140/80 | HR 85 | Temp 97.4°F | Ht 64.0 in | Wt 223.5 lb

## 2013-12-07 DIAGNOSIS — Z0000 Encounter for general adult medical examination without abnormal findings: Secondary | ICD-10-CM | POA: Diagnosis not present

## 2013-12-07 DIAGNOSIS — K3189 Other diseases of stomach and duodenum: Secondary | ICD-10-CM | POA: Diagnosis not present

## 2013-12-07 DIAGNOSIS — I10 Essential (primary) hypertension: Secondary | ICD-10-CM | POA: Diagnosis not present

## 2013-12-07 DIAGNOSIS — D509 Iron deficiency anemia, unspecified: Secondary | ICD-10-CM | POA: Diagnosis not present

## 2013-12-07 DIAGNOSIS — R51 Headache: Secondary | ICD-10-CM

## 2013-12-07 DIAGNOSIS — R197 Diarrhea, unspecified: Secondary | ICD-10-CM | POA: Diagnosis not present

## 2013-12-07 DIAGNOSIS — M7989 Other specified soft tissue disorders: Secondary | ICD-10-CM | POA: Diagnosis not present

## 2013-12-07 DIAGNOSIS — F411 Generalized anxiety disorder: Secondary | ICD-10-CM | POA: Diagnosis not present

## 2013-12-07 DIAGNOSIS — E119 Type 2 diabetes mellitus without complications: Secondary | ICD-10-CM | POA: Diagnosis not present

## 2013-12-07 DIAGNOSIS — W57XXXA Bitten or stung by nonvenomous insect and other nonvenomous arthropods, initial encounter: Secondary | ICD-10-CM | POA: Diagnosis not present

## 2013-12-07 DIAGNOSIS — R519 Headache, unspecified: Secondary | ICD-10-CM | POA: Insufficient documentation

## 2013-12-07 DIAGNOSIS — T148 Other injury of unspecified body region: Secondary | ICD-10-CM | POA: Diagnosis not present

## 2013-12-07 DIAGNOSIS — D539 Nutritional anemia, unspecified: Secondary | ICD-10-CM | POA: Diagnosis not present

## 2013-12-07 LAB — CBC
HEMATOCRIT: 30.5 % — AB (ref 36.0–46.0)
Hemoglobin: 9.4 g/dL — ABNORMAL LOW (ref 12.0–15.0)
MCH: 22 pg — ABNORMAL LOW (ref 26.0–34.0)
MCHC: 30.8 g/dL (ref 30.0–36.0)
MCV: 71.4 fL — AB (ref 78.0–100.0)
PLATELETS: 316 10*3/uL (ref 150–400)
RBC: 4.27 MIL/uL (ref 3.87–5.11)
RDW: 17.5 % — ABNORMAL HIGH (ref 11.5–15.5)
WBC: 6.8 10*3/uL (ref 4.0–10.5)

## 2013-12-07 LAB — ANEMIA PANEL
%SAT: 4 % — ABNORMAL LOW (ref 20–55)
ABS Retic: 76.9 10*3/uL (ref 19.0–186.0)
Ferritin: 9 ng/mL — ABNORMAL LOW (ref 10–291)
Folate: 9.3 ng/mL
Iron: 16 ug/dL — ABNORMAL LOW (ref 42–145)
RBC.: 4.27 MIL/uL (ref 3.87–5.11)
Retic Ct Pct: 1.8 % (ref 0.4–2.3)
TIBC: 368 ug/dL (ref 250–470)
UIBC: 352 ug/dL (ref 125–400)
VITAMIN B 12: 385 pg/mL (ref 211–911)

## 2013-12-07 LAB — POCT GLYCOSYLATED HEMOGLOBIN (HGB A1C): Hemoglobin A1C: 7.5

## 2013-12-07 LAB — GLUCOSE, CAPILLARY: Glucose-Capillary: 188 mg/dL — ABNORMAL HIGH (ref 70–99)

## 2013-12-07 MED ORDER — ACCU-CHEK FASTCLIX LANCETS MISC: Status: DC

## 2013-12-07 MED ORDER — ACCU-CHEK NANO SMARTVIEW W/DEVICE KIT: PACK | Status: DC

## 2013-12-07 MED ORDER — ACETAMINOPHEN-CODEINE #3 300-30 MG PO TABS: 1.0000 | ORAL_TABLET | Freq: Four times a day (QID) | ORAL | Status: DC | PRN

## 2013-12-07 MED ORDER — INSULIN GLARGINE 100 UNIT/ML SOLOSTAR PEN: 24.0000 [IU] | PEN_INJECTOR | Freq: Every day | SUBCUTANEOUS | Status: DC

## 2013-12-07 MED ORDER — INSULIN GLARGINE 100 UNIT/ML SOLOSTAR PEN: 22.0000 [IU] | PEN_INJECTOR | Freq: Every day | SUBCUTANEOUS | Status: DC

## 2013-12-07 MED ORDER — HYDROXYZINE HCL 10 MG PO TABS: 10.0000 mg | ORAL_TABLET | Freq: Two times a day (BID) | ORAL | Status: DC | PRN

## 2013-12-07 MED ORDER — GLUCOSE BLOOD VI STRP: ORAL_STRIP | Status: DC

## 2013-12-07 NOTE — Assessment & Plan Note (Signed)
Assessment: Pt with well-controlled hypertension compliant with three-class (BB, ACEi, & diuretic) anti-hypertensive therapy who presents with blood pressure of 140/80.  Plan:  -BP 140/80 at goal <140/90  -Continue furosemide 20 mg daily, lisinopril 10 mg daily, and metoprolol 50 mg BID  -Last CMP normal on 11/30/13

## 2013-12-07 NOTE — Patient Instructions (Addendum)
-  Take tylenol 3 every 6 hrs as needed for headache -Great job on improving your A1c from 7.9 to 7.5! -Will check your labwork today -I refilled your medications -Nice seeing you again!   General Instructions:   Thank you for bringing your medicines today. This helps [name] keep you safe from mistakes.   Progress Toward Treatment Goals:  Treatment Goal 12/10/2012  Hemoglobin A1C improved  Blood pressure at goal  Stop smoking smoking more    Self Care Goals & Plans:  Self Care Goal 12/07/2013  Manage my medications take my medicines as prescribed; bring my medications to every visit; refill my medications on time; follow the sick day instructions if I am sick  Monitor my health keep track of my blood glucose; keep track of my blood pressure; keep track of my weight; check my feet daily  Eat healthy foods eat more vegetables; eat fruit for snacks and desserts; eat foods that are low in salt; eat smaller portions; drink diet soda or water instead of juice or soda  Be physically active find an activity I enjoy  Stop smoking -    Home Blood Glucose Monitoring 12/10/2012  Check my blood sugar 2 times a day  When to check my blood sugar before breakfast; before dinner     Care Management & Community Referrals:  Referral 01/23/2013  Referrals made for care management support none needed  Referrals made to community resources -

## 2013-12-07 NOTE — Progress Notes (Signed)
Patient ID: [name], female   DOB: [birth year], 48 y.o.   MRN: [id number]    Subjective:   Patient ID: [name] female   DOB: 07/23/1965 48 y.o.   MRN: [id number]  HPI: [name] is a 48 y.o. woman with past medical history of insulin-dependent Type II DM, hypertension, grade 2 diastolic dysfunction, chronic microcytic anemia, schizoaffective disorder, GERD, and tobacco abuse who presents with chief complaint of headache.   She reports having a bifrontal located throbbing 10/10 headache that comes and goes for the past two days with eye pain. She was unable to sleep last night due to the pain which is relieved with tylenol every 6 hours. She has tylenol with codeine that she has not taken. She cannot take NSAID's due to her peptic ulcer disease. She denies personal or family history of migraine headaches. She denies vision change, photosensitivity, nausea/vomting, mental status change, neck stiffness, scalp tenderness, rhinorrhea, sinus tenderness, weakness, paraesthesias, or vertigo. She does reports mild neck tenderness but denies recent strain or injury. She also denies recent head injury. She had a similar headache in April of this year. She drinks coffee daily and did not have any this morning.   She was seen by GI  earlier this month for symptoms of dyspepsia and instructed to increase her nexium from 20 mg to BID which she reports compliance with taking. She was recently seen in the ED for nausea, vomiting, and diarrhea with black stools thought to be due to recent pepto bismo use. Her FOBT was negative but her hemoglobin had dropped from 11.2 on 09/15/13 to 9.7. She is not currently taking oral iron supplements. She reports her symptoms have improved and denies bleeding. She is scheduled to have EGD soon.    Her last A1c was 7.9 in May. She has been taking Lantus 22 U instead of Lantus 24 U daily due to low blood sugar and metformin 1000 mg BID. She denies recent  symptomatic hypoglycemia. She reports checking her glucose once daily with recent values in the 100's. She has polyuria and polydipsia but denies blurry vision, polyphagia, or neuropathy. She has not been following a healthy diet or exercising. She reports her weight has increased due to fluid retention from not being able to take furosemide since being sick recently.      Past Medical History  Diagnosis Date  . Arteriosclerotic cardiovascular disease (ASCVD)     Minimal at cath in [facility].stress nuclear study in 8/08 with nl EF; neg stress echo in 2010  . Diabetes mellitus, type 2 2000    Onset in 2000; no insulin  . Hyperlipidemia   . Hypertension `    during treatment with Geodon  . Gastroesophageal reflux disease     Schatzki's ring  . Anemia, iron deficiency   . Alcohol abuse   . Depression   . Community acquired pneumonia 01/03/10, 05/2010, 04/2012    2011; with pleural effusion-[facility] acute resp failure; intubated in Jan 2014 (HMPV pneumonia)  . Obesity   . Schizoaffective disorder     requiring multiple psychiatric admissions  . Dysphagia   . Diastolic dysfunction     grade 2 per echo 2011  . Pulmonary hypertension 05/02/2012    Patient needs repeat echo in 06/2012   . History of alcohol abuse 07/22/2007    Qualifier: Diagnosis of  By: Lenn Cal     Current Outpatient Prescriptions  Medication Sig Dispense Refill  . albuterol (PROVENTIL  HFA;VENTOLIN HFA) 108 (90 BASE) MCG/ACT inhaler Inhale 2 puffs into the lungs 3 (three) times daily as needed for wheezing or shortness of breath.  1 Inhaler  2  . esomeprazole (NEXIUM) 40 MG capsule Take 1 capsule (40 mg total) by mouth 2 (two) times daily before a meal.  60 capsule  3  . Ferrous Sulfate (IRON) 28 MG TABS Take 1 tablet by mouth daily.      . fluticasone (CUTIVATE) 0.05 % cream Apply topically 2 (two) times daily.  60 g  0  . furosemide (LASIX) 20 MG tablet TAKE 1 TABLET BY MOUTH ONCE DAILY.  30 tablet  1    . hydrOXYzine (ATARAX/VISTARIL) 10 MG tablet Take 10 mg by mouth 2 (two) times daily as needed for anxiety.       Marland Kitchen ibuprofen (ADVIL,MOTRIN) 200 MG tablet Take 400 mg by mouth every 8 (eight) hours as needed. Aches and pains, fever      . Insulin Glargine (LANTUS SOLOSTAR) 100 UNIT/ML SOPN Inject 24 Units into the skin at bedtime.       Marland Kitchen lisinopril (PRINIVIL,ZESTRIL) 5 MG tablet Take 2 tablets (10 mg total) by mouth daily.  180 tablet  1  . loperamide (IMODIUM) 2 MG capsule Take 1 capsule (2 mg total) by mouth 4 (four) times daily as needed for diarrhea or loose stools.  12 capsule  0  . loratadine-pseudoephedrine (CLARITIN-D 24-HOUR) 10-240 MG per 24 hr tablet Take 1 tablet by mouth daily as needed for allergies.      Marland Kitchen lovastatin (MEVACOR) 20 MG tablet Take 1 tablet (20 mg total) by mouth at bedtime.  90 tablet  3  . metFORMIN (GLUCOPHAGE) 1000 MG tablet Take 1,000 mg by mouth 2 (two) times daily with a meal.      . metoprolol (LOPRESSOR) 50 MG tablet Take 50 mg by mouth 2 (two) times daily.      . ondansetron (ZOFRAN) 4 MG tablet Take 1 tablet (4 mg total) by mouth every 8 (eight) hours as needed for nausea or vomiting.  30 tablet  1  . ziprasidone (GEODON) 80 MG capsule Take 1 capsule (80 mg total) by mouth 2 (two) times daily with a meal.  60 capsule  0   No current facility-administered medications for this visit.   Facility-Administered Medications Ordered in Other Visits  Medication Dose Route Frequency Provider Last Rate Last Dose  . 0.9 %  sodium chloride infusion   Intravenous Once Merryl Hacker, MD      . ondansetron [facility]) injection 4 mg  4 mg Intramuscular Once Merryl Hacker, MD      . pantoprazole (PROTONIX) injection 40 mg  40 mg Intravenous Once Merryl Hacker, MD       Family History  Problem Relation Age of Onset  . Colon cancer Other   . Hypertension Mother   . Stroke Father     deceased at age 40  . Heart disease Sister   . Anesthesia problems Neg Hx    . Hypotension Neg Hx   . Malignant hyperthermia Neg Hx   . Pseudochol deficiency Neg Hx    History   Social History  . Marital Status: Single    Spouse Name: N/A    Number of Children: N/A  . Years of Education: 12   Occupational History  . Disability   . UNEMPLOYED    Social History Main Topics  . Smoking status: Heavy Tobacco Smoker -- 1.00 packs/day for  30 years    Types: Cigarettes    Start date: 05/18/2012  . Smokeless tobacco: Never Used     Comment: Has almostquit totally./ ABOUT PPD  . Alcohol Use: No     Comment: hx of ETOH abuse, will still drink alcohol socially.   . Drug Use: No  . Sexual Activity: Not on file   Other Topics Concern  . 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       Weight gain  HENT: Negative for congestion.        No sinus or scalp tenderness  Eyes: Negative for blurred vision.  Respiratory: Negative for cough, shortness of breath and wheezing.   Cardiovascular: Negative for chest pain and leg swelling.  Gastrointestinal: Positive for heartburn (controlled on PPI). Negative for nausea, vomiting, abdominal pain, diarrhea, constipation, blood in stool and melena.  Genitourinary: Negative for dysuria, urgency and frequency.       Polyuria  Musculoskeletal: Negative for falls.  Neurological: Positive for headaches. Negative for dizziness, sensory change, speech change and focal weakness.  Endo/Heme/Allergies: Positive for polydipsia.    Objective:  Physical Exam: Filed Vitals:   12/07/13 1321  Height: 5\' 4"  (1.626 m)  Weight: 223 lb 8 oz (101.379 kg)    Physical Exam  Constitutional: She is oriented to person,  place, and time. She appears well-developed and well-nourished.  Falling asleep during interview  HENT:  Head: Normocephalic and atraumatic.  Eyes: EOM are normal.  Neck: Normal range of motion. Neck supple.  Cardiovascular: Normal rate and regular rhythm.   Pulmonary/Chest: Effort normal and breath sounds normal. No respiratory distress. She has no wheezes. She has no rales.  Abdominal: Soft. Bowel sounds are normal. She exhibits no distension. There is no tenderness. There is no rebound and no guarding.  Musculoskeletal: Normal range of motion. She exhibits edema (trace in bilateral LE). She exhibits no tenderness.  Neurological: She is alert and oriented to person, place, and time. No cranial nerve deficit. Coordination normal.  No facial droop No dysarthria Normal 5/5 muscle strength Normal sensation of extremities to light touch   Skin: Skin is warm and dry. No rash noted. No erythema. No pallor.  Psychiatric: She has a normal mood and affect. Her behavior is normal. Judgment and thought content normal.    Assessment & Plan:   Please see problem list for problem-based assessment and plan

## 2013-12-07 NOTE — Assessment & Plan Note (Signed)
Assessment: Pt with last A1c of 7.9 on 09/04/13 compliant with oral hypoglycemic and insulin therapy with no recent symptomatic hypoglycemic episode who presents with CBG of 188 and improved A1c of 7.5.   Plan:  -A1c 7.5 not at goal <7, continue metformin 1000 mg BID and Lantus 22 U daily. Pt given accumeter today and ordered test strips and lancets. Pt to bring in glucose meter at next visit to adjust insulin therapy as necessary.  -BP 140/80 at goal <140/90, continue furosemide 20 mg daily, lisinopril 10 mg daily, and metoprolol 50 mg BID  -LDL 64, at goal <100, continue lovastatin 20 mg daily  -Last annual eye exam on 03/17/13 and annual foot exam on 02/17/14   -Last annual urine microalbumin normal on 09/30/13  -BMI 38.34, not at goal of <25, encourage weight loss

## 2013-12-07 NOTE — Assessment & Plan Note (Signed)
Pt to have annual influenza vaccination soon.

## 2013-12-07 NOTE — Assessment & Plan Note (Addendum)
Assessment: Pt with history of iron-deficiency anemia with last colonoscopy and EGD in 2013 revealing diverticulosis and non-H pylori PUD with last anemia panel 06/12/12 that was normal and baseline Hg 11 noncompliant with oral iron therapy who presents with no active bleeding or hemodynamic instability.   Plan: -Obtain CBC -Obtain anemia panel  -Pt not currently taking oral ferrous sulfate daily -Pt scheduled to have repeat EGD   ADDENDUM: Pt with ferritin level of 9 indicating iron-deficiency anemia. Called pt on 12/10/13 and instructed her to start taking ferrous sulfate 325 mg TID with meals. She verbalized understanding and stated she will do so.

## 2013-12-07 NOTE — Assessment & Plan Note (Signed)
Assessment: Pt with two-day history of probable tension headache with response to medical therapy who presents with no alarm symptoms.   Plan:  -Prescribe acetaminophen-codeine 300-30 mg 1-2 Q 6 hr PRN pain -Avoid NSAID use in setting of PUD -Pt instructed to return if symptoms worsen or do not improve

## 2013-12-08 NOTE — Progress Notes (Signed)
Internal Medicine Clinic Attending Date of visit: 12/07/2013   Case discussed with Dr. Rabbani soon after the resident saw the patient.  We reviewed the resident's history and exam and pertinent patient test results.  I agree with the assessment, diagnosis, and plan of care documented in the resident's note. 

## 2013-12-09 NOTE — Patient Instructions (Signed)
YARETH KEARSE  12/09/2013   Your procedure is scheduled on:   12/17/2013  Report to [facility] at  845  AM.  Call this number if you have problems the morning of surgery: [phone]   Remember:   Do not eat food or drink liquids after midnight.   Take these medicines the morning of surgery with A SIP OF WATER:  Tyleonol #3, nexium, hydroxizine, lisinopril, claritin, metoprolol, zofran, geodan. Take albuterol before you come. Take 1/2 Lantus insulin the night before your surgery.   Do not wear jewelry, make-up or nail polish.  Do not wear lotions, powders, or perfumes.   Do not shave 48 hours prior to surgery. Men may shave face and neck.  Do not bring valuables to the hospital.  [facility] is not responsible for any belongings or valuables.               Contacts, dentures or bridgework may not be worn into surgery.  Leave suitcase in the car. After surgery it may be brought to your room.  For patients admitted to the hospital, discharge time is determined by your treatment team.               Patients discharged the day of surgery will not be allowed to drive home.  Name and phone number of your driver: family  Special Instructions: N/A   Please read over the following fact sheets that you were given: Pain Booklet, Coughing and Deep Breathing, Surgical Site Infection Prevention, Anesthesia Post-op Instructions and Care and Recovery After Surgery Esophagogastroduodenoscopy Esophagogastroduodenoscopy (EGD) is a procedure to examine the lining of the esophagus, stomach, and first part of the small intestine (duodenum). A long, flexible, lighted tube with a camera attached (endoscope) is inserted down the throat to view these organs. This procedure is done to detect problems or abnormalities, such as inflammation, bleeding, ulcers, or growths, in order to treat them. The procedure lasts about 5-20 minutes. It is usually an outpatient procedure, but it may need to be performed in  emergency cases in the hospital. LET YOUR CAREGIVER KNOW ABOUT:   Allergies to food or medicine.  All medicines you are taking, including vitamins, herbs, eyedrops, and over-the-counter medicines and creams.  Use of steroids (by mouth or creams).  Previous problems you or members of your family have had with the use of anesthetics.  Any blood disorders you have.  Previous surgeries you have had.  Other health problems you have.  Possibility of pregnancy, if this applies. RISKS AND COMPLICATIONS  Generally, EGD is a safe procedure. However, as with any procedure, complications can occur. Possible complications include:  Infection.  Bleeding.  Tearing (perforation) of the esophagus, stomach, or duodenum.  Difficulty breathing or not being able to breath.  Excessive sweating.  Spasms of the larynx.  Slowed heartbeat.  Low blood pressure. BEFORE THE PROCEDURE  Do not eat or drink anything for 6-8 hours before the procedure or as directed by your caregiver.  Ask your caregiver about changing or stopping your regular medicines.  If you wear dentures, be prepared to remove them before the procedure.  Arrange for someone to drive you home after the procedure. PROCEDURE   A vein will be accessed to give medicines and fluids. A medicine to relax you (sedative) and a pain reliever will be given through that access into the vein.  A numbing medicine (local anesthetic) may be sprayed on your throat for comfort and to  stop you from gagging or coughing.  A mouth guard may be placed in your mouth to protect your teeth and to keep you from biting on the endoscope.  You will be asked to lie on your left side.  The endoscope is inserted down your throat and into the esophagus, stomach, and duodenum.  Air is put through the endoscope to allow your caregiver to view the lining of your esophagus clearly.  The esophagus, stomach, and duodenum is then examined. During the exam,  your caregiver may:  Remove tissue to be examined under a microscope (biopsy) for inflammation, infection, or other medical problems.  Remove growths.  Remove objects (foreign bodies) that are stuck.  Treat any bleeding with medicines or other devices that stop tissues from bleeding (hot cautery, clipping devices).  Widen (dilate) or stretch narrowed areas of the esophagus and stomach.  The endoscope will then be withdrawn. AFTER THE PROCEDURE  You will be taken to a recovery area to be monitored. You will be able to go home once you are stable and alert.  Do not eat or drink anything until the local anesthetic and numbing medicines have worn off. You may choke.  It is normal to feel bloated, have pain with swallowing, or have a sore throat for a short time. This will wear off.  Your caregiver should be able to discuss his or her findings with you. It will take longer to discuss the test results if any biopsies were taken. Document Released: 08/03/2004 Document Revised: 08/17/2013 Document Reviewed: 03/05/2012 [employer] Patient Information 2015 [place], [place]. This information is not intended to replace advice given to you by your health care provider. Make sure you discuss any questions you have with your health care provider. PATIENT INSTRUCTIONS POST-ANESTHESIA  IMMEDIATELY FOLLOWING SURGERY:  Do not drive or operate machinery for the first twenty four hours after surgery.  Do not make any important decisions for twenty four hours after surgery or while taking narcotic pain medications or sedatives.  If you develop intractable nausea and vomiting or a severe headache please notify your doctor immediately.  FOLLOW-UP:  Please make an appointment with your surgeon as instructed. You do not need to follow up with anesthesia unless specifically instructed to do so.  WOUND CARE INSTRUCTIONS (if applicable):  Keep a dry clean dressing on the anesthesia/puncture wound site if there is  drainage.  Once the wound has quit draining you may leave it open to air.  Generally you should leave the bandage intact for twenty four hours unless there is drainage.  If the epidural site drains for more than 36-48 hours please call the anesthesia department.  QUESTIONS?:  Please feel free to call your physician or the hospital operator if you have any questions, and they will be happy to assist you.

## 2013-12-10 ENCOUNTER — Encounter (HOSPITAL_COMMUNITY)
Admission: RE | Admit: 2013-12-10 | Discharge: 2013-12-10 | Disposition: A | Discharge status: Home / Self Care | Payer: Medicare Other | Source: Ambulatory Visit | Attending: Internal Medicine | Admitting: Internal Medicine

## 2013-12-10 ENCOUNTER — Encounter (HOSPITAL_COMMUNITY): Payer: Self-pay

## 2013-12-10 DIAGNOSIS — K3189 Other diseases of stomach and duodenum: Secondary | ICD-10-CM | POA: Insufficient documentation

## 2013-12-10 DIAGNOSIS — Z01818 Encounter for other preprocedural examination: Secondary | ICD-10-CM | POA: Insufficient documentation

## 2013-12-10 DIAGNOSIS — R1013 Epigastric pain: Principal | ICD-10-CM

## 2013-12-10 DIAGNOSIS — R110 Nausea: Secondary | ICD-10-CM | POA: Insufficient documentation

## 2013-12-10 MED ORDER — FERROUS SULFATE 325 (65 FE) MG PO TABS: 325.0000 mg | ORAL_TABLET | Freq: Three times a day (TID) | ORAL | Status: DC

## 2013-12-10 NOTE — Addendum Note (Signed)
Addended byJuluis Mire on: 12/10/2013 09:13 PM   Modules accepted: Orders, Medications

## 2013-12-14 ENCOUNTER — Telehealth: Payer: Self-pay | Admitting: Internal Medicine

## 2013-12-14 NOTE — Telephone Encounter (Signed)
Patient is scheduled for OR and there is no sooner appointment, and patient is aware

## 2013-12-14 NOTE — Telephone Encounter (Signed)
PATIENT CALLED WANTING TO KNOW IF HER PROCEDURE COULD BE DONE EARLIER

## 2013-12-15 ENCOUNTER — Ambulatory Visit: Payer: Self-pay | Admitting: Gastroenterology

## 2013-12-17 ENCOUNTER — Emergency Department (HOSPITAL_COMMUNITY): Payer: Medicare Other

## 2013-12-17 ENCOUNTER — Encounter (HOSPITAL_COMMUNITY): Payer: Self-pay | Admitting: *Deleted

## 2013-12-17 ENCOUNTER — Encounter (HOSPITAL_COMMUNITY)
Admission: RE | Disposition: A | Discharge status: Home / Self Care | Payer: Self-pay | Source: Ambulatory Visit | Attending: Internal Medicine

## 2013-12-17 ENCOUNTER — Ambulatory Visit (HOSPITAL_COMMUNITY): Payer: Medicare Other | Admitting: Anesthesiology

## 2013-12-17 ENCOUNTER — Emergency Department (HOSPITAL_COMMUNITY)
Admission: EM | Admit: 2013-12-17 | Discharge: 2013-12-18 | Disposition: A | Discharge status: Home / Self Care | Payer: Medicare Other | Attending: Emergency Medicine | Admitting: Emergency Medicine

## 2013-12-17 ENCOUNTER — Telehealth (INDEPENDENT_AMBULATORY_CARE_PROVIDER_SITE_OTHER): Payer: Self-pay | Admitting: Internal Medicine

## 2013-12-17 ENCOUNTER — Encounter (HOSPITAL_COMMUNITY): Payer: Medicare Other | Admitting: Anesthesiology

## 2013-12-17 ENCOUNTER — Encounter (HOSPITAL_COMMUNITY): Payer: Self-pay | Admitting: Emergency Medicine

## 2013-12-17 ENCOUNTER — Ambulatory Visit (HOSPITAL_COMMUNITY)
Admission: RE | Admit: 2013-12-17 | Discharge: 2013-12-17 | Disposition: A | Discharge status: Home / Self Care | Payer: Medicare Other | Source: Ambulatory Visit | Attending: Internal Medicine | Admitting: Internal Medicine

## 2013-12-17 DIAGNOSIS — K3189 Other diseases of stomach and duodenum: Secondary | ICD-10-CM | POA: Insufficient documentation

## 2013-12-17 DIAGNOSIS — R220 Localized swelling, mass and lump, head: Secondary | ICD-10-CM | POA: Diagnosis not present

## 2013-12-17 DIAGNOSIS — IMO0002 Reserved for concepts with insufficient information to code with codable children: Secondary | ICD-10-CM | POA: Diagnosis not present

## 2013-12-17 DIAGNOSIS — I10 Essential (primary) hypertension: Secondary | ICD-10-CM | POA: Diagnosis not present

## 2013-12-17 DIAGNOSIS — R0989 Other specified symptoms and signs involving the circulatory and respiratory systems: Secondary | ICD-10-CM | POA: Diagnosis not present

## 2013-12-17 DIAGNOSIS — K219 Gastro-esophageal reflux disease without esophagitis: Secondary | ICD-10-CM | POA: Diagnosis not present

## 2013-12-17 DIAGNOSIS — R110 Nausea: Secondary | ICD-10-CM | POA: Insufficient documentation

## 2013-12-17 DIAGNOSIS — E669 Obesity, unspecified: Secondary | ICD-10-CM | POA: Diagnosis not present

## 2013-12-17 DIAGNOSIS — E119 Type 2 diabetes mellitus without complications: Secondary | ICD-10-CM | POA: Insufficient documentation

## 2013-12-17 DIAGNOSIS — Z8701 Personal history of pneumonia (recurrent): Secondary | ICD-10-CM | POA: Insufficient documentation

## 2013-12-17 DIAGNOSIS — Z794 Long term (current) use of insulin: Secondary | ICD-10-CM | POA: Insufficient documentation

## 2013-12-17 DIAGNOSIS — Z880 Allergy status to penicillin: Secondary | ICD-10-CM | POA: Insufficient documentation

## 2013-12-17 DIAGNOSIS — D509 Iron deficiency anemia, unspecified: Secondary | ICD-10-CM | POA: Insufficient documentation

## 2013-12-17 DIAGNOSIS — R1013 Epigastric pain: Secondary | ICD-10-CM | POA: Diagnosis not present

## 2013-12-17 DIAGNOSIS — E785 Hyperlipidemia, unspecified: Secondary | ICD-10-CM | POA: Insufficient documentation

## 2013-12-17 DIAGNOSIS — F17200 Nicotine dependence, unspecified, uncomplicated: Secondary | ICD-10-CM | POA: Diagnosis not present

## 2013-12-17 DIAGNOSIS — F259 Schizoaffective disorder, unspecified: Secondary | ICD-10-CM | POA: Diagnosis not present

## 2013-12-17 DIAGNOSIS — R6883 Chills (without fever): Secondary | ICD-10-CM | POA: Diagnosis not present

## 2013-12-17 DIAGNOSIS — K449 Diaphragmatic hernia without obstruction or gangrene: Secondary | ICD-10-CM | POA: Insufficient documentation

## 2013-12-17 DIAGNOSIS — R109 Unspecified abdominal pain: Secondary | ICD-10-CM | POA: Diagnosis not present

## 2013-12-17 DIAGNOSIS — Z79899 Other long term (current) drug therapy: Secondary | ICD-10-CM | POA: Insufficient documentation

## 2013-12-17 DIAGNOSIS — R221 Localized swelling, mass and lump, neck: Secondary | ICD-10-CM | POA: Diagnosis not present

## 2013-12-17 HISTORY — PX: ESOPHAGOGASTRODUODENOSCOPY (EGD) WITH PROPOFOL: SHX5813

## 2013-12-17 LAB — CBC WITH DIFFERENTIAL/PLATELET
BASOS ABS: 0 10*3/uL (ref 0.0–0.1)
Basophils Relative: 0 % (ref 0–1)
EOS PCT: 2 % (ref 0–5)
Eosinophils Absolute: 0.1 10*3/uL (ref 0.0–0.7)
HCT: 29.6 % — ABNORMAL LOW (ref 36.0–46.0)
Hemoglobin: 9.3 g/dL — ABNORMAL LOW (ref 12.0–15.0)
Lymphocytes Relative: 31 % (ref 12–46)
Lymphs Abs: 2.5 10*3/uL (ref 0.7–4.0)
MCH: 22.6 pg — ABNORMAL LOW (ref 26.0–34.0)
MCHC: 31.4 g/dL (ref 30.0–36.0)
MCV: 71.8 fL — ABNORMAL LOW (ref 78.0–100.0)
Monocytes Absolute: 0.4 10*3/uL (ref 0.1–1.0)
Monocytes Relative: 5 % (ref 3–12)
NEUTROS PCT: 62 % (ref 43–77)
Neutro Abs: 5 10*3/uL (ref 1.7–7.7)
PLATELETS: 335 10*3/uL (ref 150–400)
RBC: 4.12 MIL/uL (ref 3.87–5.11)
RDW: 18.4 % — AB (ref 11.5–15.5)
WBC: 8 10*3/uL (ref 4.0–10.5)

## 2013-12-17 LAB — GLUCOSE, CAPILLARY
GLUCOSE-CAPILLARY: 117 mg/dL — AB (ref 70–99)
Glucose-Capillary: 123 mg/dL — ABNORMAL HIGH (ref 70–99)
Glucose-Capillary: 97 mg/dL (ref 70–99)

## 2013-12-17 SURGERY — ESOPHAGOGASTRODUODENOSCOPY (EGD) WITH PROPOFOL
Anesthesia: Monitor Anesthesia Care

## 2013-12-17 MED ORDER — ONDANSETRON HCL 4 MG/2ML IJ SOLN
4.0000 mg | Freq: Once | INTRAMUSCULAR | Status: AC
  Administered 2013-12-17: 4 mg via INTRAVENOUS

## 2013-12-17 MED ORDER — DIPHENHYDRAMINE HCL 25 MG PO CAPS
50.0000 mg | ORAL_CAPSULE | Freq: Once | ORAL | Status: AC
  Administered 2013-12-17: 50 mg via ORAL
  Filled 2013-12-17: qty 2

## 2013-12-17 MED ORDER — FENTANYL CITRATE 0.05 MG/ML IJ SOLN
25.0000 ug | INTRAMUSCULAR | Status: AC
  Administered 2013-12-17 (×2): 25 ug via INTRAVENOUS

## 2013-12-17 MED ORDER — LIDOCAINE VISCOUS 2 % MT SOLN
OROMUCOSAL | Status: AC
  Administered 2013-12-17: 10:00:00
  Filled 2013-12-17: qty 15

## 2013-12-17 MED ORDER — FENTANYL CITRATE 0.05 MG/ML IJ SOLN
INTRAMUSCULAR | Status: AC
  Filled 2013-12-17: qty 2

## 2013-12-17 MED ORDER — PROPOFOL 10 MG/ML IV BOLUS
INTRAVENOUS | Status: AC
  Filled 2013-12-17: qty 20

## 2013-12-17 MED ORDER — ONDANSETRON HCL 4 MG/2ML IJ SOLN
INTRAMUSCULAR | Status: AC
  Filled 2013-12-17: qty 2

## 2013-12-17 MED ORDER — MIDAZOLAM HCL 2 MG/2ML IJ SOLN
INTRAMUSCULAR | Status: AC
  Filled 2013-12-17: qty 2

## 2013-12-17 MED ORDER — ONDANSETRON HCL 4 MG/2ML IJ SOLN: 4.0000 mg | Freq: Once | INTRAMUSCULAR | Status: DC | PRN

## 2013-12-17 MED ORDER — PROPOFOL INFUSION 10 MG/ML OPTIME
INTRAVENOUS | Status: DC | PRN
  Administered 2013-12-17: 125 ug/kg/min via INTRAVENOUS

## 2013-12-17 MED ORDER — LACTATED RINGERS IV SOLN
INTRAVENOUS | Status: DC
  Administered 2013-12-17 (×2): via INTRAVENOUS

## 2013-12-17 MED ORDER — FENTANYL CITRATE 0.05 MG/ML IJ SOLN: 25.0000 ug | INTRAMUSCULAR | Status: DC | PRN

## 2013-12-17 MED ORDER — MIDAZOLAM HCL 2 MG/2ML IJ SOLN
1.0000 mg | INTRAMUSCULAR | Status: DC | PRN
  Administered 2013-12-17: 2 mg via INTRAVENOUS

## 2013-12-17 MED ORDER — LIDOCAINE HCL 1 % IJ SOLN
INTRAMUSCULAR | Status: DC | PRN
  Administered 2013-12-17: 50 mg via INTRADERMAL

## 2013-12-17 MED ORDER — PROPOFOL 10 MG/ML IV BOLUS
INTRAVENOUS | Status: DC | PRN
  Administered 2013-12-17: 50 mg via INTRAVENOUS

## 2013-12-17 MED ORDER — LIDOCAINE HCL (PF) 1 % IJ SOLN
INTRAMUSCULAR | Status: AC
  Filled 2013-12-17: qty 5

## 2013-12-17 MED ORDER — STERILE WATER FOR IRRIGATION IR SOLN
Status: DC | PRN
  Administered 2013-12-17: 1000 mL

## 2013-12-17 MED ORDER — DIPHENHYDRAMINE HCL 50 MG/ML IJ SOLN
25.0000 mg | Freq: Once | INTRAMUSCULAR | Status: DC
  Filled 2013-12-17: qty 1

## 2013-12-17 SURGICAL SUPPLY — 21 items
BLOCK BITE 60FR ADLT L/F BLUE (MISCELLANEOUS) ×2 IMPLANT
DEVICE CLIP HEMOSTAT 235CM (CLIP) IMPLANT
ELECT REM PT RETURN 9FT ADLT (ELECTROSURGICAL)
ELECTRODE REM PT RTRN 9FT ADLT (ELECTROSURGICAL) IMPLANT
FLOOR PAD 36X40 (MISCELLANEOUS) ×3
FORCEPS BIOP RAD 4 LRG CAP 4 (CUTTING FORCEPS) ×2 IMPLANT
FORMALIN 10 PREFIL 20ML (MISCELLANEOUS) IMPLANT
KIT CLEAN ENDO COMPLIANCE (KITS) ×2 IMPLANT
MANIFOLD NEPTUNE II (INSTRUMENTS) ×2 IMPLANT
NDL SCLEROTHERAPY 25GX240 (NEEDLE) IMPLANT
NEEDLE SCLEROTHERAPY 25GX240 (NEEDLE) IMPLANT
PAD FLOOR 36X40 (MISCELLANEOUS) IMPLANT
PROBE APC STR FIRE (PROBE) IMPLANT
PROBE INJECTION GOLD (MISCELLANEOUS)
PROBE INJECTION GOLD 7FR (MISCELLANEOUS) IMPLANT
SNARE ROTATE MED OVAL 20MM (MISCELLANEOUS) IMPLANT
SNARE SHORT THROW 13M SML OVAL (MISCELLANEOUS) ×3 IMPLANT
SYR 50ML LL SCALE MARK (SYRINGE) ×2 IMPLANT
SYR INFLATION 60ML (SYRINGE) ×3 IMPLANT
TUBING IRRIGATION ENDOGATOR (MISCELLANEOUS) ×2 IMPLANT
WATER STERILE IRR 1000ML POUR (IV SOLUTION) ×2 IMPLANT

## 2013-12-17 NOTE — ED Provider Notes (Signed)
CSN: [id number]     Arrival date & time 12/17/13  2231 History   First MD Initiated Contact with Patient 12/17/13 2313     No chief complaint on file.    (Consider location/radiation/quality/duration/timing/severity/associated sxs/prior Treatment) HPI Comments: Patient referred to the ER by her gastroenterologist. Patient had EGD performed earlier today. After going home she started to notice that she was feeling chills and felt like her face was swollen. There is no difficulty breathing. She is swallowing comfortably. She feels like her breathing is "different", but doesn't specifically and no shortness of breath. There is no chest pain. She is not expressing any abdominal pain. Patient has not noticed any redness or rash.   Past Medical History  Diagnosis Date  . Arteriosclerotic cardiovascular disease (ASCVD)     Minimal at cath in [facility].stress nuclear study in 8/08 with nl EF; neg stress echo in 2010  . Diabetes mellitus, type 2 2000    Onset in 2000; no insulin  . Hyperlipidemia   . Hypertension `    during treatment with Geodon  . Gastroesophageal reflux disease     Schatzki's ring  . Anemia, iron deficiency   . Alcohol abuse   . Depression   . Community acquired pneumonia 01/03/10, 05/2010, 04/2012    2011; with pleural effusion-[facility] acute resp failure; intubated in Jan 2014 (HMPV pneumonia)  . Obesity   . Schizoaffective disorder     requiring multiple psychiatric admissions  . Dysphagia   . Diastolic dysfunction     grade 2 per echo 2011  . Pulmonary hypertension 05/02/2012    Patient needs repeat echo in 06/2012   . History of alcohol abuse 07/22/2007    Qualifier: Diagnosis of  By: Lenn Cal     Past Surgical History  Procedure Laterality Date  . Dilation and curettage, diagnostic / therapeutic  1992  . Esophagogastroduodenoscopy  09/16/08    Dr. Trevor Iha hiatal hernia/excoriations involving the cardia and mucosa consistent with trauma, antral  erosions  of linear petechiae ? gastritis versus early gastric antral vascular  ectasia.Marland Kitchen biopsy showed reactive gastropathy. No H. pylori.  . Esophagogastroduodenoscopy  09/2007    Dr. Evalee Mutton ring, dilated to 48 French Maloney dilator, small hiatal hernia, antral erosions, biopsies reactive gastropathy.  Azzie Almas dilation  07/17/2011    [facility] sedation-->distal esophageal stricture s/p dilation, chronic gastritis, multiple ulcers in stomach. no h.pylori  . Colonoscopy  01/2006    internal hemorrhoids  . Colonoscopy  01/10/2012    Dr. Rourk:Single anal canal hemorrhoidal tag likely source of  trivial hematochezia; right-sided colonic diverticulosis   Family History  Problem Relation Age of Onset  . Colon cancer Other   . Hypertension Mother   . Stroke Father     deceased at age 38  . Heart disease Sister   . Anesthesia problems Neg Hx   . Hypotension Neg Hx   . Malignant hyperthermia Neg Hx   . Pseudochol deficiency Neg Hx    History  Substance Use Topics  . Smoking status: Heavy Tobacco Smoker -- 1.00 packs/day for 30 years    Types: Cigarettes    Start date: 05/18/2012  . Smokeless tobacco: Never Used     Comment: Has almostquit totally./ ABOUT PPD  . Alcohol Use: No     Comment: hx of ETOH abuse, will still drink alcohol socially.    OB History   Grav Para Term Preterm Abortions TAB SAB Ect Mult Living   3 2  _0 Review of Systems  Constitutional: Positive for chills.  HENT: Positive for facial swelling.   All other systems reviewed and are negative.     Allergies  Cephalexin; Metronidazole; Orange; Shrimp; Penicillins; Sulfonamide derivatives; Glipizide; and Sulfamethoxazole-trimethoprim  Home Medications   Prior to Admission medications   Medication Sig Start Date End Date Taking? Authorizing Provider  sucralfate (CARAFATE) 1 G tablet Take 1 g by mouth 4 (four) times daily -  with meals and at bedtime.   Yes Historical Provider, MD   ACCU-CHEK FASTCLIX LANCETS MISC Use to check blood sugars up to 3 times a day as directed 12/07/13   Juluis Mire, MD  acetaminophen-codeine (TYLENOL #3) 300-30 MG per tablet Take 1-2 tablets by mouth every 6 (six) hours as needed for moderate pain. 12/07/13   Juluis Mire, MD  albuterol (PROVENTIL HFA;VENTOLIN HFA) 108 (90 BASE) MCG/ACT inhaler Inhale 2 puffs into the lungs 3 (three) times daily as needed for wheezing or shortness of breath. 10/27/12   Rigoberto Noel, MD  Blood Glucose Monitoring Suppl (ACCU-CHEK NANO SMARTVIEW) W/DEVICE KIT Use to check blood sugars up to 3 times a day as directed 12/07/13   Juluis Mire, MD  esomeprazole (NEXIUM) 40 MG capsule Take 1 capsule (40 mg total) by mouth 2 (two) times daily before a meal. 11/19/13   Orvil Feil, NP  ferrous sulfate 325 (65 FE) MG tablet Take 1 tablet (325 mg total) by mouth 3 (three) times daily with meals. 12/10/13 12/10/14  Juluis Mire, MD  fluticasone (CUTIVATE) 0.05 % cream Apply topically 2 (two) times daily. 10/19/13   Billy Fischer, MD  furosemide (LASIX) 20 MG tablet TAKE 1 TABLET BY MOUTH ONCE DAILY. 12/02/13   Nischal Narendra, MD  glucose blood (ACCU-CHEK SMARTVIEW) test strip Use to check blood sugars up to 3 times a day as directed 12/07/13   Juluis Mire, MD  hydrOXYzine (ATARAX/VISTARIL) 10 MG tablet Take 1 tablet (10 mg total) by mouth 2 (two) times daily as needed for anxiety. 12/07/13   Juluis Mire, MD  Insulin Glargine (LANTUS SOLOSTAR) 100 UNIT/ML Solostar Pen Inject 22 Units into the skin at bedtime. 12/07/13   Juluis Mire, MD  lisinopril (PRINIVIL,ZESTRIL) 5 MG tablet Take 2 tablets (10 mg total) by mouth daily.    Annia Belt, MD  loperamide (IMODIUM) 2 MG capsule Take 1 capsule (2 mg total) by mouth 4 (four) times daily as needed for diarrhea or loose stools. 11/30/13   Merryl Hacker, MD  loratadine-pseudoephedrine (CLARITIN-D 24-HOUR) 10-240 MG per 24 hr tablet Take 1 tablet by mouth daily as needed  for allergies.    Historical Provider, MD  lovastatin (MEVACOR) 20 MG tablet Take 1 tablet (20 mg total) by mouth at bedtime. 09/01/13   Neema Bobbie Stack, MD  metFORMIN (GLUCOPHAGE) 1000 MG tablet Take 1,000 mg by mouth 2 (two) times daily with a meal.    Historical Provider, MD  metoprolol (LOPRESSOR) 50 MG tablet Take 50 mg by mouth 2 (two) times daily.    Historical Provider, MD  ondansetron (ZOFRAN) 4 MG tablet Take 1 tablet (4 mg total) by mouth every 8 (eight) hours as needed for nausea or vomiting. 11/19/13   Orvil Feil, NP  ziprasidone (GEODON) 80 MG capsule Take 1 capsule (80 mg total) by mouth 2 (two) times daily with a meal. 03/26/12   Waylan Boga, NP   BP 161/85  Pulse  85  Temp(Src) 99.2 F (37.3 C) (Oral)  Resp 20  Ht _0  (1.626 m)  Wt 212 lb (96.163 kg)  BMI 36.37 kg/m2  SpO2 100%  LMP 12/06/2013 Physical Exam  Constitutional: She is oriented to person, place, and time. She appears well-developed and well-nourished. No distress.  HENT:  Head: Normocephalic and atraumatic.  Right Ear: Hearing normal.  Left Ear: Hearing normal.  Nose: Nose normal.  Mouth/Throat: Oropharynx is clear and moist and mucous membranes are normal.  Eyes: Conjunctivae and EOM are normal. Pupils are equal, round, and reactive to light.  Neck: Normal range of motion. Neck supple.  Cardiovascular: Regular rhythm, S1 normal and S2 normal.  Exam reveals no gallop and no friction rub.   No murmur heard. Pulmonary/Chest: Effort normal and breath sounds normal. No respiratory distress. She exhibits no tenderness.  Abdominal: Soft. Normal appearance and bowel sounds are normal. There is no hepatosplenomegaly. There is no tenderness. There is no rebound, no guarding, no tenderness at McBurney's point and negative Murphy's sign. No hernia.  Musculoskeletal: Normal range of motion.  Neurological: She is alert and oriented to person, place, and time. She has normal strength. No cranial nerve deficit or sensory  deficit. Coordination normal. GCS eye subscore is 4. GCS verbal subscore is 5. GCS motor subscore is 6.  Skin: Skin is warm, dry and intact. No rash noted. No cyanosis.  Psychiatric: She has a normal mood and affect. Her speech is normal and behavior is normal. Thought content normal.    ED Course  Procedures (including critical care time) Labs Review Labs Reviewed  CBC WITH DIFFERENTIAL  COMPREHENSIVE METABOLIC PANEL    Imaging Review No results found.   EKG Interpretation None      MDM   Final diagnoses:  None   facial swelling, unclear etiology  Patient complaining of facial swelling, but examination did not reveal any significant objective swelling. There is no angioedema. Oropharyngeal examination was unremarkable, airway patent. Lungs are clear. Patient did have an upper endoscopy, I be having sensation of swelling secondary to the mechanical process of the procedure. She did have sedation, cannot rule out allergic reaction, although there is no significant edema on examination and no rash. Patient administered Benadryl and monitored. She remains stable.  Patient reports chills, but has not had any fever here in the ER. Patient not experiencing any chest discomfort or abdominal pain. Acute abdominal series performed, no sign of pneumonia, perforation, free air secondary to procedure. Abdominal exam benign. Lab work unremarkable. Patient reassured, discharged to followup with her doctor. Return if she has any worsening symptoms.   Orpah Greek, MD 12/18/13 [phone]

## 2013-12-17 NOTE — Anesthesia Postprocedure Evaluation (Signed)
  Anesthesia Post-op Note  Patient: [name]  Procedure(s) Performed: Procedure(s): ESOPHAGOGASTRODUODENOSCOPY (EGD) WITH PROPOFOL (N/A)  Patient Location: PACU  Anesthesia Type:MAC  Level of Consciousness: awake, alert  and oriented  Airway and Oxygen Therapy: Patient Spontanous Breathing  Post-op Pain: none  Post-op Assessment: Post-op Vital signs reviewed, Patient's Cardiovascular Status Stable, Respiratory Function Stable, Patent Airway and No signs of Nausea or vomiting  Post-op Vital Signs: Reviewed and stable  Last Vitals:  Filed Vitals:   12/17/13 1020  BP:   Pulse:   Temp:   Resp: 26    Complications: No apparent anesthesia complications

## 2013-12-17 NOTE — Anesthesia Preprocedure Evaluation (Signed)
Anesthesia Evaluation  Patient identified by MRN, date of birth, ID band Patient awake    Reviewed: Allergy & Precautions, H&P , NPO status , Patient's Chart, lab work & pertinent test results  History of Anesthesia Complications Negative for: history of anesthetic complications  Airway Mallampati: II TM Distance: >3 FB     Dental  (+) Teeth Intact   Pulmonary pneumonia - (Feb. 2012), Current Smoker,  breath sounds clear to auscultation        Cardiovascular hypertension, Pt. on medications Rhythm:Regular Rate:Normal     Neuro/Psych PSYCHIATRIC DISORDERS (hx PTSS) Depression    GI/Hepatic GERD-  Medicated,(+)     substance abuse  alcohol use,   Endo/Other  diabetes, Type 2, Insulin Dependent, Oral Hypoglycemic Agents  Renal/GU      Musculoskeletal   Abdominal   Peds  Hematology   Anesthesia Other Findings   Reproductive/Obstetrics                           Anesthesia Physical Anesthesia Plan  ASA: III  Anesthesia Plan: MAC   Post-op Pain Management:    Induction: Intravenous  Airway Management Planned: Simple Face Mask  Additional Equipment:   Intra-op Plan:   Post-operative Plan:   Informed Consent: I have reviewed the patients History and Physical, chart, labs and discussed the procedure including the risks, benefits and alternatives for the proposed anesthesia with the patient or authorized representative who has indicated his/her understanding and acceptance.     Plan Discussed with:   Anesthesia Plan Comments:         Anesthesia Quick Evaluation

## 2013-12-17 NOTE — ED Notes (Signed)
Pt reports having an EGD today. Reports sore throat and some difficulty swallowing.

## 2013-12-17 NOTE — Transfer of Care (Signed)
Immediate Anesthesia Transfer of Care Note  Patient: [name]  Procedure(s) Performed: Procedure(s): ESOPHAGOGASTRODUODENOSCOPY (EGD) WITH PROPOFOL (N/A)  Patient Location: PACU  Anesthesia Type:MAC  Level of Consciousness: awake, alert  and oriented  Airway & Oxygen Therapy: Patient Spontanous Breathing and Patient connected to face mask oxygen, nasal airway in place  Post-op Assessment: Report given to PACU RN  Post vital signs: Reviewed and stable  Complications: No apparent anesthesia complications

## 2013-12-17 NOTE — Telephone Encounter (Signed)
Patient calls stating that she was having chills. She did not take her temperature. She had diagnostic EGD earlier today by Dr. Gala Romney and found to have small sliding hiatal hernia and gastric erosions. No diagnostic or therapeutic intervention performed. Patient informed that her rigors had nothing to do with procedure. He was advised to go to ER to be evaluated. Dr. Thurnell Garbe was informed.

## 2013-12-17 NOTE — ED Notes (Signed)
Dr. Pollina at bedside   

## 2013-12-17 NOTE — Interval H&P Note (Signed)
History and Physical Interval Note:  12/17/2013 10:12 AM  [name]  has presented today for surgery, with the diagnosis of DYSPEPSIA  The various methods of treatment have been discussed with the patient and family. After consideration of risks, benefits and other options for treatment, the patient has consented to  Procedure(s): ESOPHAGOGASTRODUODENOSCOPY (EGD) WITH PROPOFOL (N/A) as a surgical intervention .  The patient's history has been reviewed, patient examined, no change in status, stable for surgery.  I have reviewed the patient's chart and labs.  Questions were answered to the patient's satisfaction.     No change. EGD per plan.The risks, benefits, limitations, alternatives and imponderables have been reviewed with the patient. Potential for esophageal dilation, biopsy, etc. have also been reviewed.  Questions have been answered. All parties agreeable.  Manus Rudd

## 2013-12-17 NOTE — Discharge Instructions (Addendum)
°  Continue Nexium 40 mg twice daily  Add Carafate suspension 1 g 4 times daily for 2 weeks  Office visit with [name] in 2-3 weeks.   EGD Discharge instructions Please read the instructions outlined below and refer to this sheet in the next few weeks. These discharge instructions provide you with general information on caring for yourself after you leave the hospital. Your doctor may also give you specific instructions. While your treatment has been planned according to the most current medical practices available, unavoidable complications occasionally occur. If you have any problems or questions after discharge, please call your doctor. ACTIVITY  You may resume your regular activity but move at a slower pace for the next 24 hours.   Take frequent rest periods for the next 24 hours.   Walking will help expel (get rid of) the air and reduce the bloated feeling in your abdomen.   No driving for 24 hours (because of the anesthesia (medicine) used during the test).   You may shower.   Do not sign any important legal documents or operate any machinery for 24 hours (because of the anesthesia used during the test).  NUTRITION  Drink plenty of fluids.   You may resume your normal diet.   Begin with a light meal and progress to your normal diet.   Avoid alcoholic beverages for 24 hours or as instructed by your caregiver.  MEDICATIONS  You may resume your normal medications unless your caregiver tells you otherwise.  WHAT YOU CAN EXPECT TODAY  You may experience abdominal discomfort such as a feeling of fullness or gas pains.  FOLLOW-UP  Your doctor will discuss the results of your test with you.  SEEK IMMEDIATE MEDICAL ATTENTION IF ANY OF THE FOLLOWING OCCUR:  Excessive nausea (feeling sick to your stomach) and/or vomiting.   Severe abdominal pain and distention (swelling).   Trouble swallowing.   Temperature over 101 F (37.8 C).   Rectal bleeding or vomiting of blood.    Esophagogastroduodenoscopy Care After Refer to this sheet in the next few weeks. These instructions provide you with information on caring for yourself after your procedure. Your caregiver may also give you more specific instructions. Your treatment has been planned according to current medical practices, but problems sometimes occur. Call your caregiver if you have any problems or questions after your procedure.  HOME CARE INSTRUCTIONS  Do not eat or drink anything until the numbing medicine (local anesthetic) has worn off and your gag reflex has returned. You will know that the local anesthetic has worn off when you can swallow comfortably.  Do not drive for 12 hours after the procedure or as directed by your caregiver.  Only take medicines as directed by your caregiver. SEEK MEDICAL CARE IF:   You cannot stop coughing.  You are not urinating at all or less than usual. SEEK IMMEDIATE MEDICAL CARE IF:  You have difficulty swallowing.  You cannot eat or drink.  You have worsening throat or chest pain.  You have dizziness, lightheadedness, or you faint.  You have nausea or vomiting.  You have chills.  You have a fever.  You have severe abdominal pain.  You have black, tarry, or bloody stools. Document Released: 03/19/2012 Document Reviewed: 03/19/2012 [facility] Patient Information 2015 [place]. This information is not intended to replace advice given to you by your health care provider. Make sure you discuss any questions you have with your health care provider.

## 2013-12-17 NOTE — H&P (View-Only) (Signed)
Went to a cookout a few weeks and ate jalapeno peppers. Caused severe nausea. Nausea as heck. Has to lay down for hours because she doesn't want to throw up. Symptoms present for about 3 weeks. Intermittent nausea, but improved with eating. Drinking a shake twice a day. As soon as eating a meal, nauseated. No vomiting. No dysphagia. Burping a lot, relieves feelings of chest tightness/discomfort. Was taking a large amount of Advil as early as 2 weeks ago. No aspirin powders. Nexium 20 mg capsule daily. Notes early satiety. No melena, rectal bleeding.   April 2013 EGD.

## 2013-12-17 NOTE — Op Note (Signed)
[facility] [address] [place], [address]   ENDOSCOPY PROCEDURE REPORT  PATIENT: [name], [name]  MR#: #[id number] BIRTHDATE: 1965/11/25 , 47  yrs. old GENDER: Female ENDOSCOPIST: R.  Garfield Cornea, MD FACP [facility] REFERRED BY:     Dr. Hulen Luster PROCEDURE DATE:  12/17/2013 PROCEDURE:     diagnostic EGD  INDICATIONS:      dyspepsia/nausea  INFORMED CONSENT:   The risks, benefits, limitations, alternatives and imponderables have been discussed.  The potential for biopsy, esophogeal dilation, etc. have also been reviewed.  Questions have been answered.  All parties agreeable.  Please see the history and physical in the medical record for more information.  MEDICATIONS:    deep sedation per Dr. [facility]  DESCRIPTION OF PROCEDURE:   The     endoscope was introduced through the mouth and advanced to the second portion of the duodenum without difficulty or limitations.  The mucosal surfaces were surveyed very carefully during advancement of the scope and upon withdrawal.  Retroflexion view of the proximal stomach and esophagogastric junction was performed.      FINDINGS:    Esophagus normal. Stomach empty. Small hiatal hernia. Couple tiny petechiae and erosions in the antrum. No ulcer or infiltrating process. Patent pylorus. Normal first and second portion of the duodenum.  THERAPEUTIC / DIAGNOSTIC MANEUVERS PERFORMED:  none   COMPLICATIONS:  None  IMPRESSION:   Trivial antral erosions and petechiae. Small hiatal hernia. No endoscopic explanation for patient's symptoms  RECOMMENDATIONS:  For now, continue Nexium 40 mg twice daily. Add Carafate suspension 1 g 4 times a day x2 weeks. Office visit in 2-3 weeks. If not improved, further evaluation will be needed.    _______________________________ R. Garfield Cornea, MD FACP [facility] eSigned:  R. Garfield Cornea, MD FACP [facility] 12/17/2013 10:51 AM     CC:

## 2013-12-18 ENCOUNTER — Encounter (HOSPITAL_COMMUNITY): Payer: Self-pay | Admitting: Internal Medicine

## 2013-12-18 DIAGNOSIS — R0989 Other specified symptoms and signs involving the circulatory and respiratory systems: Secondary | ICD-10-CM | POA: Diagnosis not present

## 2013-12-18 DIAGNOSIS — R221 Localized swelling, mass and lump, neck: Secondary | ICD-10-CM | POA: Diagnosis not present

## 2013-12-18 DIAGNOSIS — R109 Unspecified abdominal pain: Secondary | ICD-10-CM | POA: Diagnosis not present

## 2013-12-18 DIAGNOSIS — R220 Localized swelling, mass and lump, head: Secondary | ICD-10-CM | POA: Diagnosis not present

## 2013-12-18 LAB — COMPREHENSIVE METABOLIC PANEL
ALT: 6 U/L (ref 0–35)
AST: 10 U/L (ref 0–37)
Albumin: 3.2 g/dL — ABNORMAL LOW (ref 3.5–5.2)
Alkaline Phosphatase: 67 U/L (ref 39–117)
Anion gap: 12 (ref 5–15)
BUN: 8 mg/dL (ref 6–23)
CALCIUM: 8.8 mg/dL (ref 8.4–10.5)
CHLORIDE: 104 meq/L (ref 96–112)
CO2: 25 mEq/L (ref 19–32)
Creatinine, Ser: 0.81 mg/dL (ref 0.50–1.10)
GFR calc Af Amer: >90 mL/min (ref >90)
GFR calc non Af Amer: 86 mL/min — ABNORMAL LOW (ref >90)
Glucose, Bld: 198 mg/dL — ABNORMAL HIGH (ref 70–99)
Potassium: 3.2 mEq/L — ABNORMAL LOW (ref 3.7–5.3)
SODIUM: 141 meq/L (ref 137–147)
Total Bilirubin: 0.2 mg/dL — ABNORMAL LOW (ref 0.3–1.2)
Total Protein: 6.3 g/dL (ref 6.0–8.3)

## 2013-12-18 MED ORDER — POTASSIUM CHLORIDE CRYS ER 20 MEQ PO TBCR
40.0000 meq | EXTENDED_RELEASE_TABLET | Freq: Once | ORAL | Status: AC
  Administered 2013-12-18: 40 meq via ORAL
  Filled 2013-12-18: qty 2

## 2013-12-18 NOTE — Telephone Encounter (Signed)
duplicate

## 2013-12-18 NOTE — Discharge Instructions (Signed)
Esophagogastroduodenoscopy °Esophagogastroduodenoscopy (EGD) is a procedure to examine the lining of the esophagus, stomach, and first part of the small intestine (duodenum). A long, flexible, lighted tube with a camera attached (endoscope) is inserted down the throat to view these organs. This procedure is done to detect problems or abnormalities, such as inflammation, bleeding, ulcers, or growths, in order to treat them. The procedure lasts about 5-20 minutes. It is usually an outpatient procedure, but it may need to be performed in emergency cases in the hospital. °LET YOUR CAREGIVER KNOW ABOUT:  °· Allergies to food or medicine. °· All medicines you are taking, including vitamins, herbs, eyedrops, and over-the-counter medicines and creams. °· Use of steroids (by mouth or creams). °· Previous problems you or members of your family have had with the use of anesthetics. °· Any blood disorders you have. °· Previous surgeries you have had. °· Other health problems you have. °· Possibility of pregnancy, if this applies. °RISKS AND COMPLICATIONS  °Generally, EGD is a safe procedure. However, as with any procedure, complications can occur. Possible complications include: °· Infection. °· Bleeding. °· Tearing (perforation) of the esophagus, stomach, or duodenum. °· Difficulty breathing or not being able to breath. °· Excessive sweating. °· Spasms of the larynx. °· Slowed heartbeat. °· Low blood pressure. °BEFORE THE PROCEDURE °· Do not eat or drink anything for 6-8 hours before the procedure or as directed by your caregiver. °· Ask your caregiver about changing or stopping your regular medicines. °· If you wear dentures, be prepared to remove them before the procedure. °· Arrange for someone to drive you home after the procedure. °PROCEDURE  °· A vein will be accessed to give medicines and fluids. A medicine to relax you (sedative) and a pain reliever will be given through that access into the vein. °· A numbing medicine  (local anesthetic) may be sprayed on your throat for comfort and to stop you from gagging or coughing. °· A mouth guard may be placed in your mouth to protect your teeth and to keep you from biting on the endoscope. °· You will be asked to lie on your left side. °· The endoscope is inserted down your throat and into the esophagus, stomach, and duodenum. °· Air is put through the endoscope to allow your caregiver to view the lining of your esophagus clearly. °· The esophagus, stomach, and duodenum is then examined. During the exam, your caregiver may: °¨ Remove tissue to be examined under a microscope (biopsy) for inflammation, infection, or other medical problems. °¨ Remove growths. °¨ Remove objects (foreign bodies) that are stuck. °¨ Treat any bleeding with medicines or other devices that stop tissues from bleeding (hot cautery, clipping devices). °¨ Widen (dilate) or stretch narrowed areas of the esophagus and stomach. °· The endoscope will then be withdrawn. °AFTER THE PROCEDURE °· You will be taken to a recovery area to be monitored. You will be able to go home once you are stable and alert. °· Do not eat or drink anything until the local anesthetic and numbing medicines have worn off. You may choke. °· It is normal to feel bloated, have pain with swallowing, or have a sore throat for a short time. This will wear off. °· Your caregiver should be able to discuss his or her findings with you. It will take longer to discuss the test results if any biopsies were taken. °Document Released: 08/03/2004 Document Revised: 08/17/2013 Document Reviewed: 03/05/2012 °[employer]® Patient Information ©2015 [employer]. This information is not   intended to replace advice given to you by your health care provider. Make sure you discuss any questions you have with your health care provider. ° °

## 2013-12-24 ENCOUNTER — Ambulatory Visit (INDEPENDENT_AMBULATORY_CARE_PROVIDER_SITE_OTHER): Payer: Medicare Other | Admitting: Internal Medicine

## 2013-12-24 ENCOUNTER — Encounter: Payer: Self-pay | Admitting: Internal Medicine

## 2013-12-24 VITALS — BP 159/83 | HR 85 | Temp 98.4°F | Ht 64.0 in | Wt 220.0 lb

## 2013-12-24 DIAGNOSIS — R404 Transient alteration of awareness: Secondary | ICD-10-CM | POA: Diagnosis not present

## 2013-12-24 DIAGNOSIS — G4710 Hypersomnia, unspecified: Secondary | ICD-10-CM | POA: Diagnosis not present

## 2013-12-24 DIAGNOSIS — I10 Essential (primary) hypertension: Secondary | ICD-10-CM

## 2013-12-24 DIAGNOSIS — E119 Type 2 diabetes mellitus without complications: Secondary | ICD-10-CM

## 2013-12-24 DIAGNOSIS — E876 Hypokalemia: Secondary | ICD-10-CM

## 2013-12-24 DIAGNOSIS — D509 Iron deficiency anemia, unspecified: Secondary | ICD-10-CM | POA: Diagnosis not present

## 2013-12-24 DIAGNOSIS — F411 Generalized anxiety disorder: Secondary | ICD-10-CM | POA: Diagnosis not present

## 2013-12-24 DIAGNOSIS — K3189 Other diseases of stomach and duodenum: Secondary | ICD-10-CM | POA: Diagnosis not present

## 2013-12-24 DIAGNOSIS — Z0000 Encounter for general adult medical examination without abnormal findings: Secondary | ICD-10-CM

## 2013-12-24 DIAGNOSIS — G4719 Other hypersomnia: Secondary | ICD-10-CM

## 2013-12-24 DIAGNOSIS — M7989 Other specified soft tissue disorders: Secondary | ICD-10-CM | POA: Diagnosis not present

## 2013-12-24 DIAGNOSIS — R51 Headache: Secondary | ICD-10-CM | POA: Diagnosis not present

## 2013-12-24 DIAGNOSIS — T148 Other injury of unspecified body region: Secondary | ICD-10-CM | POA: Diagnosis not present

## 2013-12-24 DIAGNOSIS — R197 Diarrhea, unspecified: Secondary | ICD-10-CM | POA: Diagnosis not present

## 2013-12-24 LAB — CBC WITH DIFFERENTIAL/PLATELET
BASOS ABS: 0 10*3/uL (ref 0.0–0.1)
Basophils Relative: 0 % (ref 0–1)
Eosinophils Absolute: 0.1 10*3/uL (ref 0.0–0.7)
Eosinophils Relative: 2 % (ref 0–5)
HCT: 31.6 % — ABNORMAL LOW (ref 36.0–46.0)
Hemoglobin: 9.7 g/dL — ABNORMAL LOW (ref 12.0–15.0)
Lymphocytes Relative: 35 % (ref 12–46)
Lymphs Abs: 2.5 10*3/uL (ref 0.7–4.0)
MCH: 22.2 pg — ABNORMAL LOW (ref 26.0–34.0)
MCHC: 30.7 g/dL (ref 30.0–36.0)
MCV: 72.5 fL — ABNORMAL LOW (ref 78.0–100.0)
Monocytes Absolute: 0.5 10*3/uL (ref 0.1–1.0)
Monocytes Relative: 7 % (ref 3–12)
NEUTROS ABS: 3.9 10*3/uL (ref 1.7–7.7)
Neutrophils Relative %: 56 % (ref 43–77)
PLATELETS: 272 10*3/uL (ref 150–400)
RBC: 4.36 MIL/uL (ref 3.87–5.11)
RDW: 20.4 % — ABNORMAL HIGH (ref 11.5–15.5)
WBC: 7 10*3/uL (ref 4.0–10.5)

## 2013-12-24 LAB — TSH: TSH: 1.315 u[IU]/mL (ref 0.350–4.500)

## 2013-12-24 LAB — COMPLETE METABOLIC PANEL WITH GFR
ALBUMIN: 3.2 g/dL — AB (ref 3.5–5.2)
ALK PHOS: 67 U/L (ref 39–117)
ALT: 7 U/L (ref 0–35)
AST: 8 U/L (ref 0–37)
BUN: 5 mg/dL — AB (ref 6–23)
CO2: 26 mEq/L (ref 19–32)
Calcium: 9 mg/dL (ref 8.4–10.5)
Chloride: 103 mEq/L (ref 96–112)
Creat: 0.77 mg/dL (ref 0.50–1.10)
GLUCOSE: 111 mg/dL — AB (ref 70–99)
POTASSIUM: 3.4 meq/L — AB (ref 3.5–5.3)
Sodium: 140 mEq/L (ref 135–145)
Total Bilirubin: <0.2 mg/dL — ABNORMAL LOW (ref 0.2–1.2)
Total Protein: 6.1 g/dL (ref 6.0–8.3)

## 2013-12-24 MED ORDER — POTASSIUM CHLORIDE ER 10 MEQ PO TBCR: 10.0000 meq | EXTENDED_RELEASE_TABLET | Freq: Every day | ORAL | Status: DC

## 2013-12-24 MED ORDER — ACCU-CHEK FASTCLIX LANCETS MISC: Status: DC

## 2013-12-24 MED ORDER — GLUCOSE BLOOD VI STRP: ORAL_STRIP | Status: DC

## 2013-12-24 NOTE — Patient Instructions (Signed)
-  Will check your labs today and call you with the results -Keep taking oral iron  -Please think about doing a sleep study -I reordered your diabetic testing supplies   General Instructions:   Thank you for bringing your medicines today. This helps [name] keep you safe from mistakes.   Progress Toward Treatment Goals:  Treatment Goal 12/10/2012  Hemoglobin A1C improved  Blood pressure at goal  Stop smoking smoking more    Self Care Goals & Plans:  Self Care Goal 12/24/2013  Manage my medications take my medicines as prescribed; bring my medications to every visit; refill my medications on time  Monitor my health keep track of my blood pressure; keep track of my weight; check my feet daily; keep track of my blood glucose  Eat healthy foods eat more vegetables; eat fruit for snacks and desserts; eat smaller portions; drink diet soda or water instead of juice or soda  Be physically active find an activity I enjoy  Stop smoking -    Home Blood Glucose Monitoring 12/10/2012  Check my blood sugar 2 times a day  When to check my blood sugar before breakfast; before dinner     Care Management & Community Referrals:  Referral 01/23/2013  Referrals made for care management support none needed  Referrals made to community resources -

## 2013-12-24 NOTE — Progress Notes (Signed)
Patient ID: [name], female   DOB: Feb 02, 1966, 48 y.o.   MRN: [id number]    Subjective:   Patient ID: [name] female   DOB: 08/25/1965 48 y.o.   MRN: [id number]  HPI: [name] is a 48 y.o.  woman with past medical history of insulin-dependent Type II DM, hypertension, grade 2 diastolic dysfunction, chronic microcytic anemia, schizoaffective disorder, GERD, and tobacco abuse who presents with chief complaint of daytime somnolence.  She has had a history of daytime somnolence and has declined sleep study testing in the past. She reports feeling lethargic and sleeping for most the day and all night for the past few days. She states she was feeling her normal self prior to that. She denies any recent medication changes, narcotic use, or recreational drug use. She reports snoring but denies waking up short of breath. She denies abnormal movements, tongue biting, urinary/bowel incontinence, or seizure history. Her glucose levels at home have been in the 100's and she denies symptomatic hypoglycemia. She denies fever, chills, or recent infection.   She had an EGD on 12/17/13 which revealed mild gastric erosions and a small hiatal hernia and was instructed to increase her nexium from 20 mg BID to 40 mg BID and take carafate for 2 weeks which she reports compliance with. She denies epigastric pain, nausea, vomiting, or bright red stools. She has dark stools due to restarting oral iron supplements which she reports compliance with. She denies vaginal or urinary bleeding. She also denies lightheadedness, loss of consciousness, or syncope.    Past Medical History  Diagnosis Date  . Arteriosclerotic cardiovascular disease (ASCVD)     Minimal at cath in [facility].stress nuclear study in 8/08 with nl EF; neg stress echo in 2010  . Diabetes mellitus, type 2 2000    Onset in 2000; no insulin  . Hyperlipidemia   . Hypertension `    during treatment with Geodon  .  Gastroesophageal reflux disease     Schatzki's ring  . Anemia, iron deficiency   . Alcohol abuse   . Depression   . Community acquired pneumonia 01/03/10, 05/2010, 04/2012    2011; with pleural effusion-[facility]osp [facility] acute resp failure; intubated in Jan 2014 (HMPV pneumonia)  . Obesity   . Schizoaffective disorder     requiring multiple psychiatric admissions  . Dysphagia   . Diastolic dysfunction     grade 2 per echo 2011  . Pulmonary hypertension 05/02/2012    Patient needs repeat echo in 06/2012   . History of alcohol abuse 07/22/2007    Qualifier: Diagnosis of  By: Lenn Cal     Current Outpatient Prescriptions  Medication Sig Dispense Refill  . ACCU-CHEK FASTCLIX LANCETS MISC Use to check blood sugars up to 3 times a day as directed  102 each  12  . acetaminophen-codeine (TYLENOL #3) 300-30 MG per tablet Take 1-2 tablets by mouth every 6 (six) hours as needed for moderate pain.  30 tablet  0  . albuterol (PROVENTIL HFA;VENTOLIN HFA) 108 (90 BASE) MCG/ACT inhaler Inhale 2 puffs into the lungs 3 (three) times daily as needed for wheezing or shortness of breath.  1 Inhaler  2  . Blood Glucose Monitoring Suppl (ACCU-CHEK NANO SMARTVIEW) W/DEVICE KIT Use to check blood sugars up to 3 times a day as directed  1 kit  1  . esomeprazole (NEXIUM) 40 MG capsule Take 1 capsule (40 mg total) by mouth 2 (two) times daily before a meal.  60 capsule  3  . ferrous sulfate 325 (65 FE) MG tablet Take 1 tablet (325 mg total) by mouth 3 (three) times daily with meals.  90 tablet  3  . fluticasone (CUTIVATE) 0.05 % cream Apply topically 2 (two) times daily.  60 g  0  . furosemide (LASIX) 20 MG tablet TAKE 1 TABLET BY MOUTH ONCE DAILY.  30 tablet  1  . glucose blood (ACCU-CHEK SMARTVIEW) test strip Use to check blood sugars up to 3 times a day as directed  100 each  12  . hydrOXYzine (ATARAX/VISTARIL) 10 MG tablet Take 1 tablet (10 mg total) by mouth 2 (two) times daily as needed for anxiety.  30 tablet  3    . Insulin Glargine (LANTUS SOLOSTAR) 100 UNIT/ML Solostar Pen Inject 22 Units into the skin at bedtime.  15 mL  12  . lisinopril (PRINIVIL,ZESTRIL) 5 MG tablet Take 2 tablets (10 mg total) by mouth daily.  180 tablet  1  . loperamide (IMODIUM) 2 MG capsule Take 1 capsule (2 mg total) by mouth 4 (four) times daily as needed for diarrhea or loose stools.  12 capsule  0  . loratadine-pseudoephedrine (CLARITIN-D 24-HOUR) 10-240 MG per 24 hr tablet Take 1 tablet by mouth daily as needed for allergies.      Marland Kitchen lovastatin (MEVACOR) 20 MG tablet Take 1 tablet (20 mg total) by mouth at bedtime.  90 tablet  3  . metFORMIN (GLUCOPHAGE) 1000 MG tablet Take 1,000 mg by mouth 2 (two) times daily with a meal.      . metoprolol (LOPRESSOR) 50 MG tablet Take 50 mg by mouth 2 (two) times daily.      . ondansetron (ZOFRAN) 4 MG tablet Take 1 tablet (4 mg total) by mouth every 8 (eight) hours as needed for nausea or vomiting.  30 tablet  1  . sucralfate (CARAFATE) 1 G tablet Take 1 g by mouth 4 (four) times daily -  with meals and at bedtime.      . ziprasidone (GEODON) 80 MG capsule Take 1 capsule (80 mg total) by mouth 2 (two) times daily with a meal.  60 capsule  0   No current facility-administered medications for this visit.   Facility-Administered Medications Ordered in Other Visits  Medication Dose Route Frequency Provider Last Rate Last Dose  . 0.9 %  sodium chloride infusion   Intravenous Once Merryl Hacker, MD      . ondansetron [facility]) injection 4 mg  4 mg Intramuscular Once Merryl Hacker, MD      . pantoprazole (PROTONIX) injection 40 mg  40 mg Intravenous Once Merryl Hacker, MD       Family History  Problem Relation Age of Onset  . Colon cancer Other   . Hypertension Mother   . Stroke Father     deceased at age 17  . Heart disease Sister   . Anesthesia problems Neg Hx   . Hypotension Neg Hx   . Malignant hyperthermia Neg Hx   . Pseudochol deficiency Neg Hx    History   Social  History  . Marital Status: Single    Spouse Name: N/A    Number of Children: N/A  . Years of Education: 12   Occupational History  . Disability   . UNEMPLOYED    Social History Main Topics  . Smoking status: Heavy Tobacco Smoker -- 1.00 packs/day for 30 years    Types: Cigarettes    Start date:  05/18/2012  . Smokeless tobacco: Never Used     Comment: Has almostquit totally./ ABOUT PPD  . Alcohol Use: No     Comment: hx of ETOH abuse, will still drink alcohol socially.   . Drug Use: No  . Sexual Activity: None   Other Topics Concern  . 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 Negative for fever and chills.  Respiratory: Negative for cough, shortness of breath and wheezing.   Cardiovascular: Negative for chest pain and leg swelling.  Gastrointestinal: Negative for heartburn (Controlled on PPI therapy), nausea, vomiting, abdominal pain, diarrhea and constipation.       Dark stools with oral iron therapy  Genitourinary: Negative for dysuria, urgency and frequency.  Neurological: Negative for dizziness, focal weakness, seizures, loss of consciousness and weakness.  Psychiatric/Behavioral: The patient does not have insomnia.        Daytime somnolence    Objective:  Physical Exam: Filed Vitals:   12/24/13 1435  BP: 159/83  Pulse: 85  Temp: 98.4 F (36.9 C)  TempSrc: Oral  Height: _0  (1.626 m)  Weight: 220 lb (99.791 kg)  SpO2: 100%   Physical Exam  Constitutional: She is oriented to person, place, and time. She appears well-developed and well-nourished. No distress.  Laid down on the exam bed sleeping prior to exam.  HENT:  Head:  Normocephalic and atraumatic.  Eyes: EOM are normal.  Neck: Normal range of motion. Neck supple.  Cardiovascular: Normal rate and regular rhythm.   Pulmonary/Chest: Effort normal and breath sounds normal. No respiratory distress. She has no wheezes. She has no rales.  Abdominal: Soft. Bowel sounds are normal. She exhibits no distension. There is no tenderness. There is no rebound and no guarding.  Musculoskeletal: Normal range of motion. She exhibits edema (trace nonpitting b/l LE edema ). She exhibits no tenderness.  Neurological: She is alert and oriented to person, place, and time.  Normal 5/5 muscle strength  Skin: Skin is warm and dry. No rash noted. She is not diaphoretic. No erythema. No pallor.  Psychiatric: She has a normal mood and affect. Her behavior is normal. Judgment and thought content normal.    Assessment & Plan:   Please see problem list for problem-based assessment and plan

## 2013-12-24 NOTE — Progress Notes (Signed)
Patient ID: [name], female   DOB: 23-Jan-1966, 48 y.o.   MRN: [id number] Case discussed with Dr. Naaman Plummer soon after the resident saw the patient.  We reviewed the resident's history and exam and pertinent patient test results.  I agree with the assessment, diagnosis, and plan of care documented in the resident's note.

## 2013-12-25 DIAGNOSIS — E876 Hypokalemia: Secondary | ICD-10-CM | POA: Insufficient documentation

## 2013-12-25 HISTORY — DX: Hypokalemia: E87.6

## 2013-12-25 LAB — GLUCOSE, CAPILLARY: GLUCOSE-CAPILLARY: 149 mg/dL — AB (ref 70–99)

## 2013-12-25 NOTE — Assessment & Plan Note (Addendum)
Assessment: Pt with recent hypokalemia on chronic diuretic (lasix) therapy who presents with mild hypokalemia and no recent GI losses.     Plan:  -Obtain CMP ---> mild hypokalemia (3.4) -Prescribe potassium chloride 10 mEq daily due to chronic diuretic use -BMP in 1 week to ensure normalization of levels -Consider obtaining magnesium level

## 2013-12-25 NOTE — Assessment & Plan Note (Addendum)
Assessment: Pt with history of iron-deficiency anemia with last colonoscopy in 2013 revealing diverticulosis and recent EGD on 12/17/13 with mild antral erosions with last anemia panel on 12/07/13 with ferritin level of 9 nd baseline Hg 11 recently restarted on oral iron therapy who presents with no active bleeding or hemodynamic instability.   Plan:  -Obtain CBC w/ diff ---> Hg improved from 9.3 one week ago to 9.7 (indicating oral iron therapy is effective) -Continue ferrous sulfate 325 mg TID with meals

## 2013-12-25 NOTE — Assessment & Plan Note (Signed)
Pt declined annual influenza vaccination today.

## 2013-12-25 NOTE — Assessment & Plan Note (Signed)
Assessment: Pt with history of daytime somnolence of who presents with recent symptoms with unclear etiology possibly due to narcolepsy vs OSA.   Plan:  -Obtain CMP ---> mild hypokalemia 3.4, prescribe potassium chloride 10 mEq daily due to chronic diuretic use -Obtain TSH ---> normal -Obtain CBC w/diff ----> Hg improved from 9.3 to 9.7 after starting oral iron therapy  -Pt declined sleep study and due to insurance issue not able to place referral at this time. She stated she would think about it.

## 2013-12-25 NOTE — Assessment & Plan Note (Addendum)
Assessment: Pt with well-controlled hypertension compliant with three-class (BB, ACEi, & diuretic) anti-hypertensive therapy who presents with blood pressure of 159/83.   Plan:  -BP 159/83 not at goal <140/90  -Continue furosemide 20 mg daily, lisinopril 10 mg daily, and metoprolol 50 mg BID  -Obtain CMP ---> mild hypokalemia (3.4), prescribe potassium chloride 10 mEq daily due to chronic diuretic use

## 2013-12-25 NOTE — Addendum Note (Signed)
Addended byJuluis Mire on: 12/25/2013 11:24 PM   Modules accepted: Orders

## 2013-12-28 ENCOUNTER — Encounter: Payer: Self-pay | Admitting: Internal Medicine

## 2014-01-01 ENCOUNTER — Ambulatory Visit (INDEPENDENT_AMBULATORY_CARE_PROVIDER_SITE_OTHER): Payer: Medicare Other | Admitting: Internal Medicine

## 2014-01-01 ENCOUNTER — Encounter: Payer: Self-pay | Admitting: Internal Medicine

## 2014-01-01 ENCOUNTER — Other Ambulatory Visit (INDEPENDENT_AMBULATORY_CARE_PROVIDER_SITE_OTHER): Payer: Medicare Other

## 2014-01-01 VITALS — BP 164/88 | HR 83 | Temp 98.2°F | Wt 222.9 lb

## 2014-01-01 DIAGNOSIS — R52 Pain, unspecified: Secondary | ICD-10-CM

## 2014-01-01 DIAGNOSIS — E876 Hypokalemia: Secondary | ICD-10-CM

## 2014-01-01 DIAGNOSIS — R109 Unspecified abdominal pain: Secondary | ICD-10-CM | POA: Diagnosis not present

## 2014-01-01 DIAGNOSIS — I10 Essential (primary) hypertension: Secondary | ICD-10-CM | POA: Diagnosis not present

## 2014-01-01 LAB — BASIC METABOLIC PANEL WITH GFR
BUN: 6 mg/dL (ref 6–23)
CALCIUM: 9.3 mg/dL (ref 8.4–10.5)
CO2: 25 mEq/L (ref 19–32)
CREATININE: 0.75 mg/dL (ref 0.50–1.10)
Chloride: 104 mEq/L (ref 96–112)
GFR, Est African American: >89 mL/min
GFR, Est Non African American: >89 mL/min
Glucose, Bld: 161 mg/dL — ABNORMAL HIGH (ref 70–99)
Potassium: 3.6 mEq/L (ref 3.5–5.3)
SODIUM: 140 meq/L (ref 135–145)

## 2014-01-01 MED ORDER — DICLOFENAC SODIUM 1 % TD GEL: 2.0000 g | Freq: Four times a day (QID) | TRANSDERMAL | Status: DC

## 2014-01-01 NOTE — Assessment & Plan Note (Signed)
Has aching left flank pain starting yesterdar. Has no diarrhea, fever/chills, dysuria. Having regular BM. Does have some distension/feels like has lot of gas. Did do lot of house work recently.  Could be MSK pain or 2/2 to gas/distension. UTI less likely given no fever/dysuria. Ovarian less likely given location of the pain and doesn't appear to be as severe of a pain as one would expect from ovarian torsion.   Simethicone, tylenol for pain, Voltaren gel. instructed her to go to ED or come back to clinic if symptoms worsen or develops fever or other symptoms.

## 2014-01-01 NOTE — Patient Instructions (Addendum)
Take over the counter Gas X for gas. Take tylenol for pain. If not better, can take some ibuprofen but be careful with it since you have gastric ulcer.   Use voltaren gel for the pain and see if it helps.   Come back to the clinic or go to the emergency room if symptoms worsen or you develop fever or other symptoms.   General Instructions:   Please bring your medicines with you each time you come to clinic.  Medicines may include prescription medications, over-the-counter medications, herbal remedies, eye drops, vitamins, or other pills.   Progress Toward Treatment Goals:  Treatment Goal 12/10/2012  Hemoglobin A1C improved  Blood pressure at goal  Stop smoking smoking more    Self Care Goals & Plans:  Self Care Goal 01/01/2014  Manage my medications take my medicines as prescribed; bring my medications to every visit; refill my medications on time  Monitor my health keep track of my blood glucose; bring my glucose meter and log to each visit; check my feet daily  Eat healthy foods eat foods that are low in salt; eat baked foods instead of fried foods; eat fruit for snacks and desserts  Be physically active find an activity I enjoy  Stop smoking cut down the number of cigarettes smoked    Home Blood Glucose Monitoring 12/10/2012  Check my blood sugar 2 times a day  When to check my blood sugar before breakfast; before dinner     Care Management & Community Referrals:  Referral 01/23/2013  Referrals made for care management support none needed  Referrals made to community resources -

## 2014-01-01 NOTE — Progress Notes (Signed)
Subjective:    Patient ID: [name], female    DOB: Jul 08, 1965, 48 y.o.   MRN: [id number]  HPI  48 yo female with HTN, COPD, GERD, DM II on insulin, iron deficiency anemia here for left flank area pain. Pain started yesterday after waking up from sleep. It's aching pain without radiation located on the left flank area. Pain increases with movement. No diarrhea, having regular bowel movement, no hematochezia, bowel usually dark from taking iron supplement. No unusual food. Has been feeling very gasy for last few days. Is able to pass gas without problem. No dysuria, no fever, chills, no vaginal discharge. Has some nausea but no vomiting.   EGD on 12/17/13 showed mild gastric erosions and small hiatal hernia, told to increase nexium to 40 mg BID and carafate for 2 weeks. Compliant with it. Denies alcohol use.   Was recently seen for daytime somnolence in clinic on 12/24/13. TSH normal, had stable hgb with some improvement from baseline to 9.7 (from baseline 9.3) with iron therapy. Declined sleep study. Had mild hypokalemia 3.4, likely from lasix for BLE edema, was started on KCl tablet 13meQ daily.   On lisinopril 10mg  daily, metoprolol 50mg  BID for HTN. BP usually well controlled but today she is having lot of pain.  Review of Systems  Constitutional: Negative for fever, chills, activity change, appetite change and fatigue.  HENT: Negative for congestion, drooling, ear discharge, ear pain, nosebleeds, postnasal drip, rhinorrhea, sinus pressure and sore throat.   Eyes: Negative for pain, discharge and visual disturbance.  Respiratory: Negative for cough, chest tightness, shortness of breath and wheezing.   Cardiovascular: Negative for chest pain, palpitations and leg swelling.  Gastrointestinal: Positive for nausea, abdominal pain and abdominal distention. Negative for vomiting, diarrhea, constipation, blood in stool and anal bleeding.  Genitourinary: Positive for flank pain. Negative for  dysuria, urgency, hematuria, decreased urine volume and difficulty urinating.  Musculoskeletal: Negative for arthralgias, back pain, joint swelling, neck pain and neck stiffness.  Skin: Negative.   Allergic/Immunologic: Negative.   Neurological: Negative for dizziness, seizures, syncope, speech difficulty, weakness, light-headedness, numbness and headaches.  Hematological: Negative.   Psychiatric/Behavioral: Negative.        Objective:   Physical Exam  Constitutional: She is oriented to person, place, and time. She appears well-developed and well-nourished. She appears distressed.  HENT:  Head: Normocephalic and atraumatic.  Right Ear: External ear normal.  Left Ear: External ear normal.  Nose: Nose normal.  Mouth/Throat: Oropharynx is clear and moist.  Eyes: Conjunctivae and EOM are normal. Pupils are equal, round, and reactive to light. Right eye exhibits no discharge. Left eye exhibits no discharge. No scleral icterus.  Neck: Normal range of motion. Neck supple. No JVD present. No thyromegaly present.  Cardiovascular: Normal rate, regular rhythm, S1 normal, S2 normal, normal heart sounds and intact distal pulses.  Exam reveals no gallop and no friction rub.   No murmur heard. Pulmonary/Chest: Effort normal and breath sounds normal. No respiratory distress. She has no wheezes. She has no rales. She exhibits no tenderness.  Abdominal: Soft. Bowel sounds are normal. She exhibits no shifting dullness, no distension, no fluid wave, no ascites and no mass. There is no hepatosplenomegaly, splenomegaly or hepatomegaly. There is no rebound, no guarding and no CVA tenderness. No hernia.    Has mild TTP on left flank area. No CVA Also has some mild TTP on epigastric region.  Mild distension of the abdomen.  Musculoskeletal: Normal range of motion. She  exhibits no edema and no tenderness.  Lymphadenopathy:    She has no cervical adenopathy.  Neurological: She is alert and oriented to person,  place, and time. She has normal strength and normal reflexes. No cranial nerve deficit or sensory deficit.  Skin: No rash noted. She is not diaphoretic. No erythema. No pallor.  Psychiatric: She has a normal mood and affect. Her behavior is normal.       Assessment & Plan:  See problem based a&p.

## 2014-01-01 NOTE — Assessment & Plan Note (Signed)
Remains elevated Filed Vitals:   01/01/14 1538  BP: 164/88  Pulse: 83  Temp: 98.2 F (36.8 C)  continue lasix 20mg  daily, lisinopril 10mg  daily, and metoprolol 50mg  BID. Elevation might be from pain. Will re-assess next time.

## 2014-01-01 NOTE — Assessment & Plan Note (Signed)
Had mild hypokalemia of 3.4 last visit. Repeat BMP today showed K 3.6. Is taking K+ tablet. Likely 2/2 to lasix use. Continue KCl tablet 19mEQ.

## 2014-01-04 NOTE — Progress Notes (Signed)
INTERNAL MEDICINE TEACHING ATTENDING ADDENDUM - Aldine Contes, MD: I personally saw and evaluated Ms. Hehr in this clinic visit in conjunction with the resident, Dr. Genene Churn. I have discussed patient's plan of care with medical resident during this visit. I have confirmed the physical exam findings and have read and agree with the clinic note including the plan with the following addition: - Pt p/w flank pain - Likely MSK in origin - c/w tylenol prn and voltaren gel - If pain persists or worsens would recommend f/u in ED or in clinic - BP moderately elevated. Likely secondary to pain. Will recheck on f/u

## 2014-01-08 ENCOUNTER — Encounter (HOSPITAL_COMMUNITY): Payer: Self-pay | Admitting: Emergency Medicine

## 2014-01-08 ENCOUNTER — Emergency Department (HOSPITAL_COMMUNITY)
Admission: EM | Admit: 2014-01-08 | Discharge: 2014-01-08 | Disposition: A | Discharge status: Home / Self Care | Payer: Medicare Other | Attending: Emergency Medicine | Admitting: Emergency Medicine

## 2014-01-08 DIAGNOSIS — R350 Frequency of micturition: Secondary | ICD-10-CM | POA: Insufficient documentation

## 2014-01-08 DIAGNOSIS — Z79899 Other long term (current) drug therapy: Secondary | ICD-10-CM | POA: Insufficient documentation

## 2014-01-08 DIAGNOSIS — F259 Schizoaffective disorder, unspecified: Secondary | ICD-10-CM | POA: Insufficient documentation

## 2014-01-08 DIAGNOSIS — I10 Essential (primary) hypertension: Secondary | ICD-10-CM | POA: Diagnosis not present

## 2014-01-08 DIAGNOSIS — E119 Type 2 diabetes mellitus without complications: Secondary | ICD-10-CM | POA: Diagnosis not present

## 2014-01-08 DIAGNOSIS — F17200 Nicotine dependence, unspecified, uncomplicated: Secondary | ICD-10-CM | POA: Diagnosis not present

## 2014-01-08 DIAGNOSIS — Z880 Allergy status to penicillin: Secondary | ICD-10-CM | POA: Diagnosis not present

## 2014-01-08 DIAGNOSIS — K219 Gastro-esophageal reflux disease without esophagitis: Secondary | ICD-10-CM | POA: Insufficient documentation

## 2014-01-08 DIAGNOSIS — F3289 Other specified depressive episodes: Secondary | ICD-10-CM | POA: Diagnosis not present

## 2014-01-08 DIAGNOSIS — E785 Hyperlipidemia, unspecified: Secondary | ICD-10-CM | POA: Insufficient documentation

## 2014-01-08 DIAGNOSIS — R339 Retention of urine, unspecified: Secondary | ICD-10-CM | POA: Diagnosis not present

## 2014-01-08 DIAGNOSIS — Z8701 Personal history of pneumonia (recurrent): Secondary | ICD-10-CM | POA: Insufficient documentation

## 2014-01-08 DIAGNOSIS — E669 Obesity, unspecified: Secondary | ICD-10-CM | POA: Diagnosis not present

## 2014-01-08 DIAGNOSIS — D509 Iron deficiency anemia, unspecified: Secondary | ICD-10-CM | POA: Diagnosis not present

## 2014-01-08 DIAGNOSIS — IMO0002 Reserved for concepts with insufficient information to code with codable children: Secondary | ICD-10-CM | POA: Insufficient documentation

## 2014-01-08 DIAGNOSIS — Z791 Long term (current) use of non-steroidal anti-inflammatories (NSAID): Secondary | ICD-10-CM | POA: Insufficient documentation

## 2014-01-08 DIAGNOSIS — Z794 Long term (current) use of insulin: Secondary | ICD-10-CM | POA: Diagnosis not present

## 2014-01-08 DIAGNOSIS — F329 Major depressive disorder, single episode, unspecified: Secondary | ICD-10-CM | POA: Insufficient documentation

## 2014-01-08 LAB — I-STAT CHEM 8, ED
BUN: 5 mg/dL — ABNORMAL LOW (ref 6–23)
CALCIUM ION: 1.24 mmol/L — AB (ref 1.12–1.23)
Chloride: 105 mEq/L (ref 96–112)
Creatinine, Ser: 0.8 mg/dL (ref 0.50–1.10)
Glucose, Bld: 103 mg/dL — ABNORMAL HIGH (ref 70–99)
HCT: 39 % (ref 36.0–46.0)
HEMOGLOBIN: 13.3 g/dL (ref 12.0–15.0)
Potassium: 3.5 mEq/L — ABNORMAL LOW (ref 3.7–5.3)
SODIUM: 138 meq/L (ref 137–147)
TCO2: 22 mmol/L (ref 0–100)

## 2014-01-08 LAB — URINALYSIS, ROUTINE W REFLEX MICROSCOPIC
Bilirubin Urine: NEGATIVE
Glucose, UA: NEGATIVE mg/dL
KETONES UR: NEGATIVE mg/dL
LEUKOCYTES UA: NEGATIVE
Nitrite: NEGATIVE
Protein, ur: NEGATIVE mg/dL
Specific Gravity, Urine: 1.015 (ref 1.005–1.030)
UROBILINOGEN UA: 0.2 mg/dL (ref 0.0–1.0)
pH: 5 (ref 5.0–8.0)

## 2014-01-08 LAB — URINE MICROSCOPIC-ADD ON

## 2014-01-08 NOTE — ED Notes (Signed)
Patient also c/o urinary frequency, urgency.

## 2014-01-08 NOTE — ED Notes (Addendum)
PT states she has recently started a detox pill and got a headache today and checked her b/p and it was high and came to ED. PT states she took her medication this am.

## 2014-01-08 NOTE — ED Notes (Signed)
Pt was at [facility] today shopping and decided to take BP at [facility]- per pt reading of BP was 184/104 pt reports taking a " detox pill I got off the internet" c/o in increased headaches as well.

## 2014-01-08 NOTE — Discharge Instructions (Signed)
Do not take the detox supplements. Take your blood pressure once a day. Follow up with your doctor next week for a recheck. If your blood pressure continues to elevate, or if you develop worsening headaches, chest pain, shortness of breath, palpitations, passing out, return to the ER.  Hypertension Hypertension, commonly called high blood pressure, is when the force of blood pumping through your arteries is too strong. Your arteries are the blood vessels that carry blood from your heart throughout your body. A blood pressure reading consists of a higher number over a lower number, such as 110/72. The higher number (systolic) is the pressure inside your arteries when your heart pumps. The lower number (diastolic) is the pressure inside your arteries when your heart relaxes. Ideally you want your blood pressure below 120/80. Hypertension forces your heart to work harder to pump blood. Your arteries may become narrow or stiff. Having hypertension puts you at risk for heart disease, stroke, and other problems.  RISK FACTORS Some risk factors for high blood pressure are controllable. Others are not.  Risk factors you cannot control include:   Race. You may be at higher risk if you are African American.  Age. Risk increases with age.  Gender. Men are at higher risk than women before age 63 years. After age 92, women are at higher risk than men. Risk factors you can control include:  Not getting enough exercise or physical activity.  Being overweight.  Getting too much fat, sugar, calories, or salt in your diet.  Drinking too much alcohol. SIGNS AND SYMPTOMS Hypertension does not usually cause signs or symptoms. Extremely high blood pressure (hypertensive crisis) may cause headache, anxiety, shortness of breath, and nosebleed. DIAGNOSIS  To check if you have hypertension, your health care provider will measure your blood pressure while you are seated, with your arm held at the level of your heart.  It should be measured at least twice using the same arm. Certain conditions can cause a difference in blood pressure between your right and left arms. A blood pressure reading that is higher than normal on one occasion does not mean that you need treatment. If one blood pressure reading is high, ask your health care provider about having it checked again. TREATMENT  Treating high blood pressure includes making lifestyle changes and possibly taking medicine. Living a healthy lifestyle can help lower high blood pressure. You may need to change some of your habits. Lifestyle changes may include:  Following the DASH diet. This diet is high in fruits, vegetables, and whole grains. It is low in salt, red meat, and added sugars.  Getting at least 2 hours of brisk physical activity every week.  Losing weight if necessary.  Not smoking.  Limiting alcoholic beverages.  Learning ways to reduce stress. If lifestyle changes are not enough to get your blood pressure under control, your health care provider may prescribe medicine. You may need to take more than one. Work closely with your health care provider to understand the risks and benefits. HOME CARE INSTRUCTIONS  Have your blood pressure rechecked as directed by your health care provider.   Take medicines only as directed by your health care provider. Follow the directions carefully. Blood pressure medicines must be taken as prescribed. The medicine does not work as well when you skip doses. Skipping doses also puts you at risk for problems.   Do not smoke.   Monitor your blood pressure at home as directed by your health care provider. [place]  CARE IF:   You think you are having a reaction to medicines taken.  You have recurrent headaches or feel dizzy.  You have swelling in your ankles.  You have trouble with your vision. SEEK IMMEDIATE MEDICAL CARE IF:  You develop a severe headache or confusion.  You have unusual  weakness, numbness, or feel faint.  You have severe chest or abdominal pain.  You vomit repeatedly.  You have trouble breathing. MAKE SURE YOU:   Understand these instructions.  Will watch your condition.  Will get help right away if you are not doing well or get worse. Document Released: 04/02/2005 Document Revised: 08/17/2013 Document Reviewed: 01/23/2013 [facility] Patient Information 2015 [place], [place]. This information is not intended to replace advice given to you by your health care provider. Make sure you discuss any questions you have with your health care provider.

## 2014-01-08 NOTE — ED Provider Notes (Signed)
CSN: [id number]     Arrival date & time 01/08/14  1320 History   First MD Initiated Contact with Patient 01/08/14 1421    This chart was scribed for Orpah Greek, * by Terressa Koyanagi, ED Scribe. This patient was seen in room APA04/[facility]04 and the patient's care was started at 2:22 PM.   Chief Complaint  Patient presents with  . Hypertension   The history is provided by the patient. No language interpreter was used.   PCP: Julious Oka, MD HPI Comments: [name] is a 48 y.o. female, with medical Hx noted below and significant for HTN, DMTII, and HLD who presents to the Emergency Department complaining of elevated BP onset today. Pt reports she checked her BP while at [facility] earlier today and had a BP of 184/104. Pt reports that she takes meds to control her BP and she has been compliant with her meds (last dose taken this morning). Pt notes, however, that she has been taking detox pills and garcinia for weight loss, and took a dose of the detox OTC meds last night. Pt notes her PCP does not know that she is on a detox pill and garcinia. Pt also reports that she got into an argument with her son today which she believes may be a contributing factor to her elevated BP.   Pt also complains of associated worsening HAs; and notes she has been having frequent HAs lately. Pt further reports intermittent frequency and urinary incontinence lately.   During exam, pt's BP is 155/93.    Past Medical History  Diagnosis Date  . Arteriosclerotic cardiovascular disease (ASCVD)     Minimal at cath in [facility].stress nuclear study in 8/08 with nl EF; neg stress echo in 2010  . Diabetes mellitus, type 2 2000    Onset in 2000; no insulin  . Hyperlipidemia   . Hypertension `    during treatment with Geodon  . Gastroesophageal reflux disease     Schatzki's ring  . Anemia, iron deficiency   . Alcohol abuse   . Depression   . Community acquired pneumonia 01/03/10, 05/2010, 04/2012     2011; with pleural effusion-[facility] acute resp failure; intubated in Jan 2014 (HMPV pneumonia)  . Obesity   . Schizoaffective disorder     requiring multiple psychiatric admissions  . Dysphagia   . Diastolic dysfunction     grade 2 per echo 2011  . Pulmonary hypertension 05/02/2012    Patient needs repeat echo in 06/2012   . History of alcohol abuse 07/22/2007    Qualifier: Diagnosis of  By: Lenn Cal     Past Surgical History  Procedure Laterality Date  . Dilation and curettage, diagnostic / therapeutic  1992  . Esophagogastroduodenoscopy  09/16/08    Dr. Trevor Iha hiatal hernia/excoriations involving the cardia and mucosa consistent with trauma, antral erosions  of linear petechiae ? gastritis versus early gastric antral vascular  ectasia.Marland Kitchen biopsy showed reactive gastropathy. No H. pylori.  . Esophagogastroduodenoscopy  09/2007    Dr. Evalee Mutton ring, dilated to 46 French Maloney dilator, small hiatal hernia, antral erosions, biopsies reactive gastropathy.  Azzie Almas dilation  07/17/2011    [facility] sedation-->distal esophageal stricture s/p dilation, chronic gastritis, multiple ulcers in stomach. no h.pylori  . Colonoscopy  01/2006    internal hemorrhoids  . Colonoscopy  01/10/2012    Dr. Rourk:Single anal canal hemorrhoidal tag likely source of  trivial hematochezia; right-sided colonic diverticulosis  . Esophagogastroduodenoscopy (egd) with propofol N/A  12/17/2013    Procedure: ESOPHAGOGASTRODUODENOSCOPY (EGD) WITH PROPOFOL;  Surgeon: Daneil Dolin, MD;  Location: [facility];  Service: Endoscopy;  Laterality: N/A;   Family History  Problem Relation Age of Onset  . Colon cancer Other   . Hypertension Mother   . Stroke Father     deceased at age 48  . Heart disease Sister   . Anesthesia problems Neg Hx   . Hypotension Neg Hx   . Malignant hyperthermia Neg Hx   . Pseudochol deficiency Neg Hx    History  Substance Use Topics  . Smoking status: Heavy Tobacco Smoker  -- 0.80 packs/day for 30 years    Types: Cigarettes    Start date: 05/18/2012  . Smokeless tobacco: Never Used     Comment: cutting back,  . Alcohol Use: No     Comment: hx of ETOH abuse   OB History   Grav Para Term Preterm Abortions TAB SAB Ect Mult Living   3 2 2  1 1    2      Review of Systems  Constitutional: Negative for fever and chills.  Genitourinary: Positive for frequency.       Urinary incontinence   Neurological: Positive for headaches.  Psychiatric/Behavioral: Negative for confusion.  All other systems reviewed and are negative.     Allergies  Cephalexin; Metronidazole; Orange; Shrimp; Penicillins; Sulfonamide derivatives; Glipizide; and Sulfamethoxazole-trimethoprim  Home Medications   Prior to Admission medications   Medication Sig Start Date End Date Taking? Authorizing Provider  albuterol (PROVENTIL HFA;VENTOLIN HFA) 108 (90 BASE) MCG/ACT inhaler Inhale 2 puffs into the lungs 3 (three) times daily as needed for wheezing or shortness of breath. 10/27/12  Yes Rigoberto Noel, MD  diclofenac sodium (VOLTAREN) 1 % GEL Apply 2 g topically 4 (four) times daily. 01/01/14  Yes Tasrif Ahmed, MD  esomeprazole (NEXIUM) 40 MG capsule Take 1 capsule (40 mg total) by mouth 2 (two) times daily before a meal. 11/19/13  Yes Orvil Feil, NP  ferrous sulfate 325 (65 FE) MG tablet Take 1 tablet (325 mg total) by mouth 3 (three) times daily with meals. 12/10/13 12/10/14 Yes Marjan Rabbani, MD  fluticasone (CUTIVATE) 0.05 % cream Apply topically 2 (two) times daily. 10/19/13  Yes Billy Fischer, MD  furosemide (LASIX) 20 MG tablet TAKE 1 TABLET BY MOUTH ONCE DAILY. 12/02/13  Yes Aldine Contes, MD  hydrOXYzine (ATARAX/VISTARIL) 10 MG tablet Take 1 tablet (10 mg total) by mouth 2 (two) times daily as needed for anxiety. 12/07/13  Yes Marjan Rabbani, MD  Insulin Glargine (LANTUS SOLOSTAR) 100 UNIT/ML Solostar Pen Inject 22 Units into the skin at bedtime. 12/07/13  Yes Juluis Mire, MD   lisinopril (PRINIVIL,ZESTRIL) 5 MG tablet Take 2 tablets (10 mg total) by mouth daily.   Yes Annia Belt, MD  loratadine-pseudoephedrine (CLARITIN-D 24-HOUR) 10-240 MG per 24 hr tablet Take 1 tablet by mouth daily as needed for allergies.   Yes Historical Provider, MD  lovastatin (MEVACOR) 20 MG tablet Take 1 tablet (20 mg total) by mouth at bedtime. 09/01/13  Yes Neema Bobbie Stack, MD  metFORMIN (GLUCOPHAGE) 1000 MG tablet Take 1,000 mg by mouth 2 (two) times daily with a meal.   Yes Historical Provider, MD  metoprolol (LOPRESSOR) 50 MG tablet Take 50 mg by mouth 2 (two) times daily.   Yes Historical Provider, MD  potassium chloride (K-DUR) 10 MEQ tablet Take 1 tablet (10 mEq total) by mouth daily. 12/24/13  Yes Juluis Mire, MD  sucralfate (CARAFATE) 1 G tablet Take 1 g by mouth 4 (four) times daily -  with meals and at bedtime.   Yes Historical Provider, MD  ziprasidone (GEODON) 80 MG capsule Take 1 capsule (80 mg total) by mouth 2 (two) times daily with a meal. 03/26/12  Yes Waylan Boga, NP   Triage Vitals: BP 196/94  Pulse 85  Temp(Src) 99.3 F (37.4 C) (Oral)  Ht 5\' 4"  (1.626 m)  Wt 213 lb (96.616 kg)  BMI 36.54 kg/m2  SpO2 100%  LMP 01/01/2014 Physical Exam  Constitutional: She is oriented to person, place, and time. She appears well-developed and well-nourished. No distress.  HENT:  Head: Normocephalic and atraumatic.  Right Ear: Hearing normal.  Left Ear: Hearing normal.  Nose: Nose normal.  Mouth/Throat: Oropharynx is clear and moist and mucous membranes are normal.  Eyes: Conjunctivae and EOM are normal. Pupils are equal, round, and reactive to light.  Neck: Normal range of motion. Neck supple.  Cardiovascular: Regular rhythm, S1 normal and S2 normal.  Exam reveals no gallop and no friction rub.   No murmur heard. Pulmonary/Chest: Effort normal and breath sounds normal. No respiratory distress. She exhibits no tenderness.  Abdominal: Soft. Normal appearance and bowel  sounds are normal. There is no hepatosplenomegaly. There is no tenderness. There is no rebound, no guarding, no tenderness at McBurney's point and negative Murphy's sign. No hernia.  Musculoskeletal: Normal range of motion.  Neurological: She is alert and oriented to person, place, and time. She has normal strength. No cranial nerve deficit or sensory deficit. Coordination normal. GCS eye subscore is 4. GCS verbal subscore is 5. GCS motor subscore is 6.  Skin: Skin is warm, dry and intact. No rash noted. No cyanosis.  Psychiatric: She has a normal mood and affect. Her speech is normal and behavior is normal. Thought content normal.    ED Course  Procedures (including critical care time) DIAGNOSTIC STUDIES: Oxygen Saturation is 100% on RA, nl by my interpretation.    COORDINATION OF CARE: 2:31 PM-Discussed treatment plan which includes labs and UA with pt at bedside and pt agreed to plan.   Labs Review Labs Reviewed  I-STAT CHEM 8, ED - Abnormal; Notable for the following:    Potassium 3.5 (*)    BUN 5 (*)    Glucose, Bld 103 (*)    Calcium, Ion 1.24 (*)    All other components within normal limits  URINALYSIS, ROUTINE W REFLEX MICROSCOPIC    Imaging Review No results found.   EKG Interpretation None      MDM   Final diagnoses:  Essential hypertension   Patient presents to ER for evaluation of elevated blood pressure. Patient reports that she has been having intermittent headaches. She is not currently experiencing the headache. Headaches are dull, vertex or frontal when they occur. No vision change. No fever. No numbness, tingling or weakness of extremities.  The patient has a blood pressure at [employer] today it was 184/104. She has history of hypertension. She takes her medication as prescribed. Patient has, however been taking detox supplements bought off of the Internet. It is not clear what is in the supplements, she does not have it with her. I have advised her to stop  all of her over-the-counter supplements. She will take her blood pressure daily and follow up with her doctor next week for a recheck. She was given return precautions.  Patient's electrolytes, BUN and creatinine were unremarkable today.  I personally performed the  services described in this documentation, which was scribed in my presence. The recorded information has been reviewed and is accurate.     Orpah Greek, MD 01/09/14 [phone]

## 2014-01-08 NOTE — ED Notes (Signed)
MD at bedside. 

## 2014-01-15 ENCOUNTER — Ambulatory Visit: Payer: Self-pay | Admitting: Internal Medicine

## 2014-01-15 ENCOUNTER — Ambulatory Visit: Payer: Self-pay | Admitting: Gastroenterology

## 2014-01-19 ENCOUNTER — Ambulatory Visit (INDEPENDENT_AMBULATORY_CARE_PROVIDER_SITE_OTHER): Payer: Medicare Other | Admitting: Internal Medicine

## 2014-01-19 ENCOUNTER — Encounter: Payer: Self-pay | Admitting: Internal Medicine

## 2014-01-19 VITALS — BP 162/87 | HR 83 | Temp 98.4°F | Ht 64.0 in

## 2014-01-19 DIAGNOSIS — Z418 Encounter for other procedures for purposes other than remedying health state: Secondary | ICD-10-CM | POA: Diagnosis not present

## 2014-01-19 DIAGNOSIS — I10 Essential (primary) hypertension: Secondary | ICD-10-CM

## 2014-01-19 DIAGNOSIS — Z299 Encounter for prophylactic measures, unspecified: Secondary | ICD-10-CM

## 2014-01-19 DIAGNOSIS — D509 Iron deficiency anemia, unspecified: Secondary | ICD-10-CM

## 2014-01-19 DIAGNOSIS — Z23 Encounter for immunization: Secondary | ICD-10-CM

## 2014-01-19 DIAGNOSIS — F17200 Nicotine dependence, unspecified, uncomplicated: Secondary | ICD-10-CM

## 2014-01-19 DIAGNOSIS — Z720 Tobacco use: Secondary | ICD-10-CM | POA: Diagnosis not present

## 2014-01-19 DIAGNOSIS — E669 Obesity, unspecified: Secondary | ICD-10-CM

## 2014-01-19 LAB — GLUCOSE, CAPILLARY: Glucose-Capillary: 134 mg/dL — ABNORMAL HIGH (ref 70–99)

## 2014-01-19 MED ORDER — LISINOPRIL 20 MG PO TABS: 20.0000 mg | ORAL_TABLET | Freq: Every day | ORAL | Status: DC

## 2014-01-19 NOTE — Assessment & Plan Note (Addendum)
Continue with GI evaluation. She has an appointment with the GI on 01/25/2014. Otherwise, continue with ferrous iron pill supplementation. She is tolerating it well without side effects. I encouraged her to use over-the-counter stool softeners if she develops constipation. If the cause of her iron deficiency cannot be identified by GI evaluation other possibilities like celiac disease should be explored. Will check a CBC in 1-2 months. Her current hemoglobin is 9.7. TSH was checked and was normal.

## 2014-01-19 NOTE — Assessment & Plan Note (Signed)
BP Readings from Last 3 Encounters:  01/19/14 162/87  01/08/14 159/78  01/01/14 164/88    Lab Results  Component Value Date   NA 138 01/08/2014   K 3.5* 01/08/2014   CREATININE 0.80 01/08/2014    Assessment: Blood pressure control: moderately elevated Progress toward BP goal:  deteriorated Comments: She takes lisinopril 10 mg daily. I will increase that to 20 mg daily.  Plan: Medications: Increase lisinopril from 10 mg daily to 20 mg daily. Educational resources provided: Therapist, sports tools provided:   Other plans: Evaluate in one to 2 weeks.

## 2014-01-19 NOTE — Patient Instructions (Signed)
General Instructions: Please increase Lisinopril to 20 mg daily  Please cont with your other medications  Come back in 2 weeks for a follow up visit   Thank you for bringing your medicines today. This helps [name] keep you safe from mistakes.   Progress Toward Treatment Goals:  Treatment Goal 01/19/2014  Hemoglobin A1C unchanged  Blood pressure deteriorated  Stop smoking smoking the same amount    Self Care Goals & Plans:  Self Care Goal 01/19/2014  Manage my medications take my medicines as prescribed; bring my medications to every visit; refill my medications on time; follow the sick day instructions if I am sick  Monitor my health keep track of my blood glucose; keep track of my blood pressure; keep track of my weight  Eat healthy foods eat more vegetables; eat fruit for snacks and desserts; eat baked foods instead of fried foods; eat foods that are low in salt; eat smaller portions  Be physically active find an activity I enjoy  Stop smoking -    Home Blood Glucose Monitoring 01/19/2014  Check my blood sugar once a day  When to check my blood sugar before breakfast     Care Management & Community Referrals:  Referral 01/19/2014  Referrals made for care management support none needed  Referrals made to community resources -

## 2014-01-19 NOTE — Assessment & Plan Note (Signed)
Encourage patient to quit cigarette smoking. She's not ready to quit. Will continue with ongoing counseling.

## 2014-01-19 NOTE — Assessment & Plan Note (Signed)
Discussed weight loss with her as this may help with hypertension.

## 2014-01-19 NOTE — Progress Notes (Signed)
Patient ID: [name], female   DOB: 09/24/1965, 48 y.o.   MRN: [id number]   Subjective:   HPI: [name] is a 48 y.o. woman with past medical history of hypertension, iron deficiency, anemia, obesity, and cigarette smoking, presents for an ED followup visit.  Reason(s) for this visit: 1. Followup of elevated blood pressure: on 01/08/2014 at [facility] BP was 184/104 , which prompted her to present to the ED on the same day. BP in the ED at some point, was 194/104. She has history of hypertension. She takes her medication as prescribed. Patient has, however been taking detox supplements (Garcinia and Pure Cleanse Pro) bought off of the Internet. She has stopped taking the supplements since the ED visit. She was encouraged to followup with [name] for BP check. BP today is elevated x2 at 160/90. 2. Iron deficiency anemia: She continues to complain of ongoing fatigue. She was recently evaluated by GI who recommended an EGD, which did not reveal any acute abnormality. She has a followup visit in 2-3 weeks. She is compliant with her iron supplements. No recent active bleeding. She had questions about need for IV iron, and I discussed with her that this will not be necessary. We'll need to check a CBC in 2-3 months. Her hemoglobin is 9.7.  Otherwise, no other complaints. ROS: Constitutional: Denies fever, chills, diaphoresis, appetite change and fatigue.  Respiratory: Denies SOB, DOE, cough, chest tightness, and wheezing. Denies chest pain. CVS: No chest pain, palpitations and leg swelling.  GI: No abdominal pain, nausea, vomiting, bloody stools GU: No dysuria, frequency, hematuria, or flank pain.  MSK: No myalgias, back pain, joint swelling, arthralgias  Psych: No depression symptoms. No SI or SA.    Objective:  Physical Exam: Filed Vitals:   01/19/14 1559  BP: 162/87  Pulse: 83  Temp: 98.4 F (36.9 C)  TempSrc: Oral  Height: 5\' 4"  (1.626 m)  SpO2: 99%   General: Well  nourished. No acute distress.  HEENT: Normal oral mucosa. MMM.  Lungs: CTA bilaterally. Heart: RRR; no extra sounds or murmurs  Abdomen: Non-distended, normal bowel sounds, soft, nontender; no hepatosplenomegaly  Extremities: No pedal edema. No joint swelling or tenderness. Neurologic: Normal EOM,  Alert and oriented x3. No obvious neurologic/cranial nerve deficits.  Assessment & Plan:  Discussed case with my attending in the clinic, Dr. Dareen Piano See problem based charting.

## 2014-01-20 NOTE — Progress Notes (Signed)
INTERNAL MEDICINE TEACHING ATTENDING ADDENDUM - Dequante Tremaine, MD: I reviewed and discussed at the time of visit with the resident Dr. Kazibwe, the patient's medical history, physical examination, diagnosis and results of pertinent tests and treatment and I agree with the patient's care as documented.  

## 2014-01-24 ENCOUNTER — Telehealth: Payer: Self-pay | Admitting: Internal Medicine

## 2014-01-24 NOTE — Telephone Encounter (Signed)
   Reason for call:   I received a call from Ms. [name] at 10 and 12  PM indicating that she had a "whistling sensation in her throat" and wanted to know if it was an emergency. She stated she has been able to sleep w/o complication, has not had SOB, DOE, CP, blurry vision, recent illness, cough, hoarseness,fever, or chills. She states she has been taking all her medications and is just concerned given her HTN and other medical problems   Pertinent Data:   Reviewed problem list and most recent clinical encounters   Assessment / Plan / Recommendations:   Pt was reassurred that this was not an emergency and that she could have an appt to discuss it with a MD on 01/25/14. Pt agreed and all questions and concerns addressed. A note was forwarded to PCP and scheduling staff.   As always, pt is advised that if symptoms worsen or new symptoms arise, they should go to an urgent care facility or to to ER for further evaluation.   Clinton Gallant, MD   01/24/2014, 12:03 AM

## 2014-01-25 ENCOUNTER — Ambulatory Visit (INDEPENDENT_AMBULATORY_CARE_PROVIDER_SITE_OTHER): Payer: Medicare Other | Admitting: Gastroenterology

## 2014-01-25 ENCOUNTER — Encounter: Payer: Self-pay | Admitting: Gastroenterology

## 2014-01-25 VITALS — BP 186/90 | HR 77 | Temp 98.4°F | Ht 64.0 in | Wt 216.4 lb

## 2014-01-25 DIAGNOSIS — D509 Iron deficiency anemia, unspecified: Secondary | ICD-10-CM | POA: Diagnosis not present

## 2014-01-25 DIAGNOSIS — R1013 Epigastric pain: Secondary | ICD-10-CM | POA: Diagnosis not present

## 2014-01-25 LAB — IRON AND TIBC
%SAT: 13 % — AB (ref 20–55)
Iron: 48 ug/dL (ref 42–145)
TIBC: 359 ug/dL (ref 250–470)
UIBC: 311 ug/dL (ref 125–400)

## 2014-01-25 LAB — FERRITIN: FERRITIN: 17 ng/mL (ref 10–291)

## 2014-01-25 LAB — CBC WITH DIFFERENTIAL/PLATELET
Basophils Absolute: 0 10*3/uL (ref 0.0–0.1)
Basophils Relative: 0 % (ref 0–1)
EOS ABS: 0.1 10*3/uL (ref 0.0–0.7)
EOS PCT: 2 % (ref 0–5)
HCT: 37.8 % (ref 36.0–46.0)
Hemoglobin: 11.7 g/dL — ABNORMAL LOW (ref 12.0–15.0)
LYMPHS ABS: 2.1 10*3/uL (ref 0.7–4.0)
Lymphocytes Relative: 37 % (ref 12–46)
MCH: 23.4 pg — AB (ref 26.0–34.0)
MCHC: 31 g/dL (ref 30.0–36.0)
MCV: 75.4 fL — AB (ref 78.0–100.0)
MONO ABS: 0.3 10*3/uL (ref 0.1–1.0)
Monocytes Relative: 6 % (ref 3–12)
NEUTROS PCT: 55 % (ref 43–77)
Neutro Abs: 3.1 10*3/uL (ref 1.7–7.7)
Platelets: 216 10*3/uL (ref 150–400)
RBC: 5.01 MIL/uL (ref 3.87–5.11)
RDW: 23.6 % — ABNORMAL HIGH (ref 11.5–15.5)
WBC: 5.7 10*3/uL (ref 4.0–10.5)

## 2014-01-25 NOTE — Assessment & Plan Note (Addendum)
48 year old lady with history of recent recurrent nausea/vomiting, early satiety in the setting of NSAID use. EGD showed no evidence of persistent ulcer. Really nothing to explain her ongoing symptoms. Patient has had significant improvement of her nausea and abdominal pain, feels much better. Currently on Nexium 40 mg twice daily, Carafate, Zofran as needed. Looking through her record she actually has a history of iron deficiency anemia. Somewhat heavy menses. Heme negative a couple months ago. Most recent colonoscopy 2013. IDA may be related to her menstrual cycle but need to consider GI etiology. Patient complains of bowel habit change the last couple of months, question etiology.  Initially we'll start with rechecking iron, ferritin, CBC. IFOBT. If she has recurrent nausea and vomiting, abdominal pain, consider abdominal ultrasound to check gallbladder. Further recommendations to follow.  Loose stool not addressed today, we'll touch base with patient once labs have returned.

## 2014-01-25 NOTE — Progress Notes (Signed)
Primary Care Physician: Julious Oka, MD  Primary Gastroenterologist:  Garfield Cornea, MD   Chief Complaint  Patient presents with  . Follow-up    HPI: [name] is a 48 y.o. female here for followup of recent EGD. Exam dyspepsia/nausea. She had trivial antral erosions and petechiae. Felt not to explain  her symptoms. Carafate suspension added for 2 weeks. On iron for IDA. Nausea much better. No abdominal pain or GERD. No dysphagia. BM sometimes loose and then sometimes pieces. BM 4-5 times per day. Couple of months like this. Some brbpr recently. Used to have BM once per day or even skip days. Complains of fatigue.   Menstrual cycle every 28 days, last for 5 days, 2-3 days heavy with clots.   Current Outpatient Prescriptions  Medication Sig Dispense Refill  . albuterol (PROVENTIL HFA;VENTOLIN HFA) 108 (90 BASE) MCG/ACT inhaler Inhale 2 puffs into the lungs 3 (three) times daily as needed for wheezing or shortness of breath.  1 Inhaler  2  . diclofenac sodium (VOLTAREN) 1 % GEL Apply 2 g topically 4 (four) times daily.  1 Tube  1  . esomeprazole (NEXIUM) 40 MG capsule Take 1 capsule (40 mg total) by mouth 2 (two) times daily before a meal.  60 capsule  3  . ferrous sulfate 325 (65 FE) MG tablet Take 1 tablet (325 mg total) by mouth 3 (three) times daily with meals.  90 tablet  3  . fluticasone (CUTIVATE) 0.05 % cream Apply topically 2 (two) times daily.  60 g  0  . furosemide (LASIX) 20 MG tablet TAKE 1 TABLET BY MOUTH ONCE DAILY.  30 tablet  1  . hydrOXYzine (ATARAX/VISTARIL) 10 MG tablet Take 1 tablet (10 mg total) by mouth 2 (two) times daily as needed for anxiety.  30 tablet  3  . Insulin Glargine (LANTUS SOLOSTAR) 100 UNIT/ML Solostar Pen Inject 22 Units into the skin at bedtime.  15 mL  12  . lisinopril (PRINIVIL,ZESTRIL) 20 MG tablet Take 1 tablet (20 mg total) by mouth daily.  60 tablet  1  . loratadine-pseudoephedrine (CLARITIN-D 24-HOUR) 10-240 MG per 24 hr  tablet Take 1 tablet by mouth daily as needed for allergies.      Marland Kitchen lovastatin (MEVACOR) 20 MG tablet Take 1 tablet (20 mg total) by mouth at bedtime.  90 tablet  3  . metFORMIN (GLUCOPHAGE) 1000 MG tablet Take 1,000 mg by mouth 2 (two) times daily with a meal.      . metoprolol (LOPRESSOR) 50 MG tablet Take 50 mg by mouth 2 (two) times daily.      . potassium chloride (K-DUR) 10 MEQ tablet Take 1 tablet (10 mEq total) by mouth daily.  30 tablet  3  . sucralfate (CARAFATE) 1 G tablet Take 1 g by mouth 4 (four) times daily -  with meals and at bedtime.      . ziprasidone (GEODON) 80 MG capsule Take 1 capsule (80 mg total) by mouth 2 (two) times daily with a meal.  60 capsule  0   No current facility-administered medications for this visit.   Facility-Administered Medications Ordered in Other Visits  Medication Dose Route Frequency Provider Last Rate Last Dose  . 0.9 %  sodium chloride infusion   Intravenous Once Merryl Hacker, MD      . ondansetron [facility]) injection 4 mg  4 mg Intramuscular Once Merryl Hacker, MD      . pantoprazole (PROTONIX) injection  40 mg  40 mg Intravenous Once Merryl Hacker, MD        Allergies as of 01/25/2014 - Review Complete 01/25/2014  Allergen Reaction Noted  . Cephalexin  10/14/2013  . Metronidazole Shortness Of Breath and Swelling   . Orange Itching 03/27/2011  . Shrimp [shellfish allergy] Shortness Of Breath and Itching 03/27/2011  . Penicillins Hives and Swelling   . Sulfonamide derivatives Hives   . Glipizide Other (See Comments) 01/15/2011  . Sulfamethoxazole-trimethoprim Rash 02/27/2010    ROS:  General: Negative for anorexia, weight loss, fever, chills, fatigue, weakness. ENT: Negative for hoarseness, difficulty swallowing , nasal congestion. CV: Negative for chest pain, angina, palpitations, dyspnea on exertion, peripheral edema.  Respiratory: Negative for dyspnea at rest, dyspnea on exertion, cough, sputum, wheezing.  GI: See  history of present illness. GU:  Negative for dysuria, hematuria, urinary incontinence, urinary frequency, nocturnal urination. See history of present illness Endo: Negative for unusual weight change.    Physical Examination:   BP 186/90  Pulse 77  Temp(Src) 98.4 F (36.9 C) (Oral)  Ht 5\' 4"  (1.626 m)  Wt 216 lb 6.4 oz (98.158 kg)  BMI 37.13 kg/m2  LMP 01/01/2014  General: Well-nourished, well-developed in no acute distress.  Eyes: No icterus. Mouth: Oropharyngeal mucosa moist and pink , no lesions erythema or exudate. Lungs: Clear to auscultation bilaterally.  Heart: Regular rate and rhythm, no murmurs rubs or gallops.  Abdomen: Bowel sounds are normal, nontender, nondistended, no hepatosplenomegaly or masses, no abdominal bruits or hernia , no rebound or guarding.   Extremities: No lower extremity edema. No clubbing or deformities. Neuro: Alert and oriented x 4   Skin: Warm and dry, no jaundice.   Psych: Alert and cooperative, normal mood and affect.  Labs:  Lab Results  Component Value Date   WBC 7.0 12/24/2013   HGB 13.3 01/08/2014   HCT 39.0 01/08/2014   MCV 72.5* 12/24/2013   PLT 272 12/24/2013   Lab Results  Component Value Date   IRON 16* 12/07/2013   TIBC 368 12/07/2013   FERRITIN 9* 12/07/2013   Lab Results  Component Value Date   CREATININE 0.80 01/08/2014   BUN 5* 01/08/2014   NA 138 01/08/2014   K 3.5* 01/08/2014   CL 105 01/08/2014   CO2 25 01/01/2014   Lab Results  Component Value Date   ALT 7 12/24/2013   AST 8 12/24/2013   ALKPHOS 67 12/24/2013   BILITOT <0.2* 12/24/2013   Heme negative 11/2013  Imaging Studies: No results found.

## 2014-01-25 NOTE — Progress Notes (Signed)
cc'ed to pcp °

## 2014-01-25 NOTE — Patient Instructions (Signed)
1. Please have your labs done. 2. Please collect stool specimen and return to our office. 3. Call if you have recurrent nausea/abdominal pain. We would consider ultrasound of your abdomen at that time.

## 2014-01-26 ENCOUNTER — Telehealth: Payer: Self-pay | Admitting: *Deleted

## 2014-01-26 ENCOUNTER — Ambulatory Visit (INDEPENDENT_AMBULATORY_CARE_PROVIDER_SITE_OTHER): Payer: Medicare Other

## 2014-01-26 ENCOUNTER — Telehealth: Payer: Self-pay | Admitting: Internal Medicine

## 2014-01-26 DIAGNOSIS — K625 Hemorrhage of anus and rectum: Secondary | ICD-10-CM

## 2014-01-26 NOTE — Progress Notes (Signed)
Quick Note:  Hgb up some. Ferritin and iron level up some.  Await ifobt. ______

## 2014-01-26 NOTE — Telephone Encounter (Signed)
Pt called and left message on triage tape - BP is still up 186/90. Called pt 1:50PM - needs FU appt 1-2 weeks from 01/19/14 - no future appt for [facility] in Epic. Left message on home ID recording - nneds to call and sch appt. Hilda Blades Kimmie Doren RN 01/26/14 2PM

## 2014-01-26 NOTE — Telephone Encounter (Signed)
PLEASE CALL PATIENT WHEN LAB RESULTS ARE IN

## 2014-01-29 LAB — IFOBT (OCCULT BLOOD): IMMUNOLOGICAL FECAL OCCULT BLOOD TEST: NEGATIVE

## 2014-01-29 NOTE — Progress Notes (Signed)
Pt returned one iFOBT and it was negative.  

## 2014-02-04 ENCOUNTER — Telehealth: Payer: Self-pay

## 2014-02-04 NOTE — Telephone Encounter (Signed)
PATIENT CALLED INQUIRING ABOUT LAB RESULTS 

## 2014-02-08 ENCOUNTER — Telehealth: Payer: Self-pay | Admitting: Internal Medicine

## 2014-02-08 NOTE — Telephone Encounter (Signed)
I spoke with the pt.  

## 2014-02-08 NOTE — Telephone Encounter (Signed)
Patient inquiring about test results.  Having stomach pain and headaches.  Please advise [phone]

## 2014-02-08 NOTE — Telephone Encounter (Signed)
I spoke with the pt- informed her of her blood work results. And negative ifobt. I told her that [facility] has not commented on ifobt yet and I am not sure of any recommendations from that.   Pt said she is still having problems with her blood pressure since her procedure and she is having a lot of nausea. No vomiting. She wants to know if there is anything we can do.

## 2014-02-10 ENCOUNTER — Encounter: Payer: Self-pay | Admitting: Internal Medicine

## 2014-02-10 ENCOUNTER — Ambulatory Visit (INDEPENDENT_AMBULATORY_CARE_PROVIDER_SITE_OTHER): Payer: Medicare Other | Admitting: Internal Medicine

## 2014-02-10 VITALS — BP 136/80 | HR 91 | Temp 98.3°F | Ht 64.0 in | Wt 215.4 lb

## 2014-02-10 DIAGNOSIS — E119 Type 2 diabetes mellitus without complications: Secondary | ICD-10-CM

## 2014-02-10 DIAGNOSIS — Z720 Tobacco use: Secondary | ICD-10-CM | POA: Diagnosis not present

## 2014-02-10 DIAGNOSIS — E785 Hyperlipidemia, unspecified: Secondary | ICD-10-CM | POA: Diagnosis not present

## 2014-02-10 DIAGNOSIS — I10 Essential (primary) hypertension: Secondary | ICD-10-CM

## 2014-02-10 DIAGNOSIS — F17200 Nicotine dependence, unspecified, uncomplicated: Secondary | ICD-10-CM

## 2014-02-10 LAB — BASIC METABOLIC PANEL WITH GFR
BUN: 8 mg/dL (ref 6–23)
CHLORIDE: 101 meq/L (ref 96–112)
CO2: 25 mEq/L (ref 19–32)
Calcium: 9.8 mg/dL (ref 8.4–10.5)
Creat: 0.79 mg/dL (ref 0.50–1.10)
Glucose, Bld: 184 mg/dL — ABNORMAL HIGH (ref 70–99)
Potassium: 3.8 mEq/L (ref 3.5–5.3)
SODIUM: 136 meq/L (ref 135–145)

## 2014-02-10 LAB — LIPID PANEL
Cholesterol: 133 mg/dL (ref 0–200)
HDL: 36 mg/dL — ABNORMAL LOW (ref >39)
LDL Cholesterol: 48 mg/dL (ref 0–99)
Total CHOL/HDL Ratio: 3.7 Ratio
Triglycerides: 245 mg/dL — ABNORMAL HIGH (ref <150)
VLDL: 49 mg/dL — ABNORMAL HIGH (ref 0–40)

## 2014-02-10 MED ORDER — LISINOPRIL 20 MG PO TABS: 20.0000 mg | ORAL_TABLET | Freq: Every day | ORAL | Status: DC

## 2014-02-10 MED ORDER — ESOMEPRAZOLE MAGNESIUM 40 MG PO CPDR: 40.0000 mg | DELAYED_RELEASE_CAPSULE | Freq: Two times a day (BID) | ORAL | Status: DC

## 2014-02-10 NOTE — Progress Notes (Signed)
Subjective:   Patient ID: [name] [name] female   DOB: 07/20/1965 48 y.o.   MRN: [id number]  HPI: [name] [name] is a 48 y.o. female with PMH as listed below presenting to opc today for BP follow up visit.   HTN: initially 163/73 but improved to 136/80 on repeat. Compliant with her medications including lasix, lisinopril, and lopressor.  She claims she does not like taking the lasix because she believes she gets headaches due to it and since starting lasix her BP has been higher.  She claims her BP gets high at home sometimes and that is when she also has headaches at time. SBP 160-180's at home per patient but no log brought to clinic visit today. She does not recall when lasix was started but says it was started due to fluid.   We discussed keeping a log of blood pressures at home and bringing that to next visit.  Past Medical History  Diagnosis Date  . Arteriosclerotic cardiovascular disease (ASCVD)     Minimal at cath in [facility].stress nuclear study in 8/08 with nl EF; neg stress echo in 2010  . Diabetes mellitus, type 2 2000    Onset in 2000; no insulin  . Hyperlipidemia   . Hypertension `    during treatment with Geodon  . Gastroesophageal reflux disease     Schatzki's ring  . Anemia, iron deficiency   . Alcohol abuse   . Depression   . Community acquired pneumonia 01/03/10, 05/2010, 04/2012    2011; with pleural effusion-[facility] acute resp failure; intubated in Jan 2014 (HMPV pneumonia)  . Obesity   . Schizoaffective disorder     requiring multiple psychiatric admissions  . Dysphagia   . Diastolic dysfunction     grade 2 per echo 2011  . Pulmonary hypertension 05/02/2012    Patient needs repeat echo in 06/2012   . History of alcohol abuse 07/22/2007    Qualifier: Diagnosis of  By: Lenn Cal     Current Outpatient Prescriptions  Medication Sig Dispense Refill  . esomeprazole (NEXIUM) 40 MG capsule Take 1 capsule (40 mg total) by mouth 2 (two)  times daily before a meal.  60 capsule  2  . ferrous sulfate 325 (65 FE) MG tablet Take 1 tablet (325 mg total) by mouth 3 (three) times daily with meals.  90 tablet  3  . fluticasone (CUTIVATE) 0.05 % cream Apply topically 2 (two) times daily.  60 g  0  . Insulin Glargine (LANTUS SOLOSTAR) 100 UNIT/ML Solostar Pen Inject 22 Units into the skin at bedtime.  15 mL  12  . lisinopril (PRINIVIL,ZESTRIL) 20 MG tablet Take 1 tablet (20 mg total) by mouth daily.  60 tablet  1  . lovastatin (MEVACOR) 20 MG tablet Take 1 tablet (20 mg total) by mouth at bedtime.  90 tablet  3  . metFORMIN (GLUCOPHAGE) 1000 MG tablet Take 1,000 mg by mouth 2 (two) times daily with a meal.      . metoprolol (LOPRESSOR) 50 MG tablet Take 50 mg by mouth 2 (two) times daily.      Marland Kitchen NOVOFINE 32G X 6 MM MISC       . potassium chloride (K-DUR) 10 MEQ tablet Take 1 tablet (10 mEq total) by mouth daily.  30 tablet  3  . sucralfate (CARAFATE) 1 G tablet Take 1 g by mouth 4 (four) times daily -  with meals and at bedtime.      Marland Kitchen  ziprasidone (GEODON) 80 MG capsule Take 1 capsule (80 mg total) by mouth 2 (two) times daily with a meal.  60 capsule  0  . albuterol (PROVENTIL HFA;VENTOLIN HFA) 108 (90 BASE) MCG/ACT inhaler Inhale 2 puffs into the lungs 3 (three) times daily as needed for wheezing or shortness of breath.  1 Inhaler  2  . diclofenac sodium (VOLTAREN) 1 % GEL Apply 2 g topically 4 (four) times daily.  1 Tube  1  . hydrOXYzine (ATARAX/VISTARIL) 10 MG tablet Take 1 tablet (10 mg total) by mouth 2 (two) times daily as needed for anxiety.  30 tablet  3  . loratadine-pseudoephedrine (CLARITIN-D 24-HOUR) 10-240 MG per 24 hr tablet Take 1 tablet by mouth daily as needed for allergies.       No current facility-administered medications for this visit.   Facility-Administered Medications Ordered in Other Visits  Medication Dose Route Frequency Provider Last Rate Last Dose  . 0.9 %  sodium chloride infusion   Intravenous Once  Merryl Hacker, MD      . ondansetron [facility]) injection 4 mg  4 mg Intramuscular Once Merryl Hacker, MD      . pantoprazole (PROTONIX) injection 40 mg  40 mg Intravenous Once Merryl Hacker, MD       Family History  Problem Relation Age of Onset  . Colon cancer Other   . Hypertension Mother   . Stroke Father     deceased at age 29  . Heart disease Sister   . Anesthesia problems Neg Hx   . Hypotension Neg Hx   . Malignant hyperthermia Neg Hx   . Pseudochol deficiency Neg Hx    History   Social History  . Marital Status: Single    Spouse Name: N/A    Number of Children: N/A  . Years of Education: 12   Occupational History  . Disability   . UNEMPLOYED    Social History Main Topics  . Smoking status: Current Every Day Smoker -- 0.80 packs/day for 30 years    Types: Cigarettes    Start date: 05/18/2012  . Smokeless tobacco: Never Used     Comment: cutting back,  . Alcohol Use: No     Comment: hx of ETOH abuse  . Drug Use: No  . Sexual Activity: None   Other Topics Concern  . 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   HEENT:  Denies congestion   Respiratory:  Denies SOB  Cardiovascular:  Denies chest pain   Gastrointestinal:  Denies nausea, vomiting, abdominal pain   Genitourinary:  Frequency with diuretic  Musculoskeletal:  Denies gait problem.   Neurological:  Headaches.    Objective:  Physical Exam: Filed Vitals:   02/10/14 1609 02/10/14 1633  BP: 163/73 136/80  Pulse: 87 91  Temp: 98.3 F (36.8 C)   TempSrc: Oral   Height: 5\' 4"  (1.626 m)   Weight: 215 lb 6.4 oz (97.705 kg)   SpO2: 100%    Vitals reviewed. General: sitting in chair, NAD HEENT:  EOMI Cardiac: RRR Pulm: clear to auscultation bilaterally Abd: soft, BS present Ext: moving all 4 extremities Neuro: alert and oriented X3  Assessment & Plan:  Discussed with  Dr. Eppie Gibson

## 2014-02-10 NOTE — Telephone Encounter (Signed)
She needs to address BP issues with PCP.  Suspect her IDA related to menses. Continue iron as before.   Recheck CBC, iron/tibc, ferritin in 3 months.  For n/v, let's get abdominal u/s. If she is still having loose stool, recommend stool culture, giardia/cryptsporidium, c.diff pcr.

## 2014-02-10 NOTE — Progress Notes (Signed)
Quick Note:  Please let patient know her ifobt was negative!  See telephone note for further instructions. ______

## 2014-02-11 NOTE — Assessment & Plan Note (Signed)
Lab Results  Component Value Date   HGBA1C 7.5 12/07/2013   HGBA1C 7.9 09/04/2013   HGBA1C 8.2* 06/10/2013    Assessment: Diabetes control: fair control Progress toward A1C goal:    Comments: did not bring meter today  Plan: Medications:  continue current medications lantus 22units and metformin 1000mg  bid Home glucose monitoring: Frequency:   Timing: before meals Instruction/counseling given: reminded to bring blood glucose meter & log to each visit and reminded to bring medications to each visit Educational resources provided:   Self management tools provided:   Other plans: recheck a1c next visit and address possible adjustment to medications. Hopefully she will bring her log.

## 2014-02-11 NOTE — Telephone Encounter (Signed)
Pt is aware. She went to her pcp yesterday and they told her that her bp was better. Lab orders on file. Stool containers and orders are at the front desk, she will come by the office and pick them up. Ok to schedule U/S.  Ginger, please schedule U/S for Nausea and vomiting.

## 2014-02-11 NOTE — Assessment & Plan Note (Signed)
  Assessment: Progress toward smoking cessation:  smoking the same amount (almost 1 pack per day) Barriers to progress toward smoking cessation:    Comments: not ready to quit at this time  Plan: Instruction/counseling given:  I counseled patient on the dangers of tobacco use, advised patient to stop smoking, and reviewed strategies to maximize success. Educational resources provided:    Self management tools provided:    Medications to assist with smoking cessation:  None Patient agreed to the following self-care plans for smoking cessation: set a quit date and stop smoking;cut down the number of cigarettes smoked;call [facility] (1-800-QUIT-NOW)  Other plans: continue to discuss cessation techniques

## 2014-02-11 NOTE — Telephone Encounter (Signed)
Tried to call pt- NA 

## 2014-02-11 NOTE — Assessment & Plan Note (Signed)
BP Readings from Last 3 Encounters:  02/10/14 136/80  01/25/14 186/90  01/19/14 162/87   Lab Results  Component Value Date   NA 136 02/10/2014   K 3.8 02/10/2014   CREATININE 0.79 02/10/2014   Assessment: Blood pressure control: controlled Progress toward BP goal:  at goal Comments: improved on repeat, does not like being on lasix, reports higher blood pressures and headaches and wishes to stop lasix.   Plan: Medications:  will d/c lasix, continue lisinopril 20mg  daily (changed from 5mg  tablets to 20mg  tablet), and continue lopressor 50mg  bid Educational resources provided:   Self management tools provided:  BP log provided Other plans: will check bmet today, K 3.8, will hold kdur since not taking lasix anymore. Advised to check blood pressure at home in the meantime while holding lasix and record values on log and bring to follow up clinic visit in 2 weeks. She may need to call the clinic or be seen sooner if BP remaining elevated at home SBP>140-150 and/or if having symptoms including persistent headaches, nausea, vomiting, vision changes, sob, or chest pain. She understands the plan and will let us know.  If BP still elevated, would consider maximizing current medication doses, consider going up on ACEi. If she does need volume control as well, could consider adding a thiazide if needed.

## 2014-02-11 NOTE — Assessment & Plan Note (Addendum)
Due for lipid panel repeat today. TG rising (high, 245)  but HDL improving. LDL 48  Could consider increasing statin dose, currently on lovastatin 20mg  (would increase to 40mg ) but I think she would also benefit from dietary modifications and counseling as well. I believe dietary changes along with exercise and improved diabetes control will be essential. This will require a lot more education that can hopefully done on follow up visit.  -consider nutrition consultation if she is willing -would recommend above plan with her on follow up visit and proceed if she agrees.

## 2014-02-12 ENCOUNTER — Other Ambulatory Visit: Payer: Self-pay | Admitting: Gastroenterology

## 2014-02-12 ENCOUNTER — Other Ambulatory Visit: Payer: Self-pay

## 2014-02-12 DIAGNOSIS — R112 Nausea with vomiting, unspecified: Secondary | ICD-10-CM

## 2014-02-12 DIAGNOSIS — D509 Iron deficiency anemia, unspecified: Secondary | ICD-10-CM

## 2014-02-12 NOTE — Telephone Encounter (Signed)
Left message on when her appointment was(02/18/14@ 8:45)

## 2014-02-15 ENCOUNTER — Encounter: Payer: Self-pay | Admitting: Internal Medicine

## 2014-02-16 LAB — GLUCOSE, CAPILLARY: GLUCOSE-CAPILLARY: 166 mg/dL — AB (ref 70–99)

## 2014-02-17 NOTE — Progress Notes (Signed)
Case discussed with Dr. Qureshi soon after the resident saw the patient.  We reviewed the resident's history and exam and pertinent patient test results.  I agree with the assessment, diagnosis and plan of care documented in the resident's note. 

## 2014-02-18 ENCOUNTER — Ambulatory Visit (HOSPITAL_COMMUNITY)
Admission: RE | Admit: 2014-02-18 | Discharge: 2014-02-18 | Disposition: A | Discharge status: Home / Self Care | Payer: Medicare Other | Source: Ambulatory Visit | Attending: Gastroenterology | Admitting: Gastroenterology

## 2014-02-18 DIAGNOSIS — R112 Nausea with vomiting, unspecified: Secondary | ICD-10-CM | POA: Insufficient documentation

## 2014-02-18 DIAGNOSIS — D1803 Hemangioma of intra-abdominal structures: Secondary | ICD-10-CM | POA: Diagnosis not present

## 2014-02-21 ENCOUNTER — Emergency Department (HOSPITAL_COMMUNITY)
Admission: EM | Admit: 2014-02-21 | Discharge: 2014-02-22 | Disposition: A | Discharge status: Home / Self Care | Payer: Medicare Other | Attending: Emergency Medicine | Admitting: Emergency Medicine

## 2014-02-21 ENCOUNTER — Encounter (HOSPITAL_COMMUNITY): Payer: Self-pay | Admitting: Emergency Medicine

## 2014-02-21 ENCOUNTER — Emergency Department (HOSPITAL_COMMUNITY): Payer: Medicare Other

## 2014-02-21 DIAGNOSIS — K219 Gastro-esophageal reflux disease without esophagitis: Secondary | ICD-10-CM | POA: Diagnosis not present

## 2014-02-21 DIAGNOSIS — R51 Headache: Secondary | ICD-10-CM | POA: Insufficient documentation

## 2014-02-21 DIAGNOSIS — Z880 Allergy status to penicillin: Secondary | ICD-10-CM | POA: Diagnosis not present

## 2014-02-21 DIAGNOSIS — F259 Schizoaffective disorder, unspecified: Secondary | ICD-10-CM | POA: Diagnosis not present

## 2014-02-21 DIAGNOSIS — Z791 Long term (current) use of non-steroidal anti-inflammatories (NSAID): Secondary | ICD-10-CM | POA: Diagnosis not present

## 2014-02-21 DIAGNOSIS — E119 Type 2 diabetes mellitus without complications: Secondary | ICD-10-CM | POA: Diagnosis not present

## 2014-02-21 DIAGNOSIS — Z794 Long term (current) use of insulin: Secondary | ICD-10-CM | POA: Diagnosis not present

## 2014-02-21 DIAGNOSIS — Z720 Tobacco use: Secondary | ICD-10-CM | POA: Insufficient documentation

## 2014-02-21 DIAGNOSIS — E785 Hyperlipidemia, unspecified: Secondary | ICD-10-CM | POA: Insufficient documentation

## 2014-02-21 DIAGNOSIS — Z8701 Personal history of pneumonia (recurrent): Secondary | ICD-10-CM | POA: Diagnosis not present

## 2014-02-21 DIAGNOSIS — D509 Iron deficiency anemia, unspecified: Secondary | ICD-10-CM | POA: Insufficient documentation

## 2014-02-21 DIAGNOSIS — I2510 Atherosclerotic heart disease of native coronary artery without angina pectoris: Secondary | ICD-10-CM | POA: Diagnosis not present

## 2014-02-21 DIAGNOSIS — I10 Essential (primary) hypertension: Secondary | ICD-10-CM | POA: Insufficient documentation

## 2014-02-21 DIAGNOSIS — Z79899 Other long term (current) drug therapy: Secondary | ICD-10-CM | POA: Diagnosis not present

## 2014-02-21 DIAGNOSIS — I519 Heart disease, unspecified: Secondary | ICD-10-CM | POA: Diagnosis not present

## 2014-02-21 DIAGNOSIS — E669 Obesity, unspecified: Secondary | ICD-10-CM | POA: Insufficient documentation

## 2014-02-21 DIAGNOSIS — F329 Major depressive disorder, single episode, unspecified: Secondary | ICD-10-CM | POA: Diagnosis not present

## 2014-02-21 DIAGNOSIS — R519 Headache, unspecified: Secondary | ICD-10-CM

## 2014-02-21 MED ORDER — DIPHENHYDRAMINE HCL 50 MG/ML IJ SOLN
25.0000 mg | Freq: Once | INTRAMUSCULAR | Status: AC
  Administered 2014-02-21: 25 mg via INTRAVENOUS
  Filled 2014-02-21: qty 1

## 2014-02-21 MED ORDER — LORAZEPAM 2 MG/ML IJ SOLN
1.0000 mg | Freq: Once | INTRAMUSCULAR | Status: AC
  Administered 2014-02-21: 1 mg via INTRAVENOUS
  Filled 2014-02-21: qty 1

## 2014-02-21 MED ORDER — ONDANSETRON HCL 4 MG/2ML IJ SOLN
4.0000 mg | Freq: Once | INTRAMUSCULAR | Status: AC
  Administered 2014-02-21: 4 mg via INTRAVENOUS
  Filled 2014-02-21: qty 2

## 2014-02-21 MED ORDER — LABETALOL HCL 5 MG/ML IV SOLN
20.0000 mg | Freq: Once | INTRAVENOUS | Status: AC
  Administered 2014-02-21: 20 mg via INTRAVENOUS
  Filled 2014-02-21: qty 4

## 2014-02-21 MED ORDER — FENTANYL CITRATE 0.05 MG/ML IJ SOLN
50.0000 ug | Freq: Once | INTRAMUSCULAR | Status: AC
  Administered 2014-02-21: 50 ug via INTRAVENOUS
  Filled 2014-02-21: qty 2

## 2014-02-21 MED ORDER — MAGNESIUM SULFATE 2 GM/50ML IV SOLN
2.0000 g | Freq: Once | INTRAVENOUS | Status: AC
  Administered 2014-02-21: 2 g via INTRAVENOUS
  Filled 2014-02-21: qty 50

## 2014-02-21 MED ORDER — METOCLOPRAMIDE HCL 5 MG/ML IJ SOLN
10.0000 mg | Freq: Once | INTRAMUSCULAR | Status: AC
  Administered 2014-02-21: 10 mg via INTRAVENOUS
  Filled 2014-02-21: qty 2

## 2014-02-21 MED ORDER — SODIUM CHLORIDE 0.9 % IV BOLUS (SEPSIS)
2000.0000 mL | Freq: Once | INTRAVENOUS | Status: AC
  Administered 2014-02-21: 2000 mL via INTRAVENOUS

## 2014-02-21 NOTE — ED Notes (Signed)
Pt c/o emesis and worsening headache. Dr. Tomi Bamberger informed.

## 2014-02-21 NOTE — ED Notes (Signed)
Pt placed on cardiac monitor for mag admin.

## 2014-02-21 NOTE — ED Notes (Signed)
Pt states she has had diarrhea all day and forgot to tell the Triage RN.

## 2014-02-21 NOTE — ED Notes (Addendum)
Pt reports headache x1.5 weeks. Pt reports increased pain, light and sound sensitivity,nausea,generalized weakness. nad noted.

## 2014-02-21 NOTE — ED Provider Notes (Signed)
CSN: [id number]     Arrival date & time 02/21/14  1710 History   First MD Initiated Contact with Patient 02/21/14 1819     Chief Complaint  Patient presents with  . Headache     (Consider location/radiation/quality/duration/timing/severity/associated sxs/prior Treatment) HPI  She reports she has had a constant bilateral frontal headache for the past week. She states the pain is sharp and throbbing. She states she takes Tylenol which only helps briefly with the pain and it returns. She has also tried Advil without relief. She denies rhinorrhea or sneezing. She states she's had a cough for the last 3 days and feels like she has to clear her throat. She does not have a sore throat or fever. She has nausea without vomiting. She states she does have some discomfort on the left side of her neck that does not hurt when she has range of motion of her head. She denies numbness or tingling of her extremities. She states she feels like she's having blurred vision. She reports for the last couple weeks she feels like she's having memory problems such as remembering her aunt's birthday that she is her caregiver. She states she used to have headaches when she was younger that she related to her menses. She is currently on her period now. She denies any increased stress recently. She states she's had 6 episodes of diarrhea today.  Patient states her CBGs have been good however her blood pressure has been elevated for several weeks. She had her blood pressure medication increased about 2 weeks ago without improvement. She states she's keeping a log of her blood pressure for her PCP. Her lisinopril was increased from 5 mg a day to 20 mg a day.  Patient states she had a first cousin who had a cranial aneurysm.  PCP Dr Hulen [facility]  Past Medical History  Diagnosis Date  . Arteriosclerotic cardiovascular disease (ASCVD)     Minimal at cath in [facility].stress nuclear study in 8/08 with nl EF; neg stress  echo in 2010  . Diabetes mellitus, type 2 2000    Onset in 2000; no insulin  . Hyperlipidemia   . Hypertension `    during treatment with Geodon  . Gastroesophageal reflux disease     Schatzki's ring  . Anemia, iron deficiency   . Alcohol abuse   . Depression   . Community acquired pneumonia 01/03/10, 05/2010, 04/2012    2011; with pleural effusion-[facility] acute resp failure; intubated in Jan 2014 (HMPV pneumonia)  . Obesity   . Schizoaffective disorder     requiring multiple psychiatric admissions  . Dysphagia   . Diastolic dysfunction     grade 2 per echo 2011  . Pulmonary hypertension 05/02/2012    Patient needs repeat echo in 06/2012   . History of alcohol abuse 07/22/2007    Qualifier: Diagnosis of  By: Lenn Cal     Past Surgical History  Procedure Laterality Date  . Dilation and curettage, diagnostic / therapeutic  1992  . Esophagogastroduodenoscopy  09/16/08    Dr. Trevor Iha hiatal hernia/excoriations involving the cardia and mucosa consistent with trauma, antral erosions  of linear petechiae ? gastritis versus early gastric antral vascular  ectasia.Marland Kitchen biopsy showed reactive gastropathy. No H. pylori.  . Esophagogastroduodenoscopy  09/2007    Dr. Evalee Mutton ring, dilated to 36 French Maloney dilator, small hiatal hernia, antral erosions, biopsies reactive gastropathy.  Azzie Almas dilation  07/17/2011    [facility] sedation-->distal esophageal stricture s/p dilation,  chronic gastritis, multiple ulcers in stomach. no h.pylori  . Colonoscopy  01/2006    internal hemorrhoids  . Colonoscopy  01/10/2012    Dr. Rourk:Single anal canal hemorrhoidal tag likely source of  trivial hematochezia; right-sided colonic diverticulosis  . Esophagogastroduodenoscopy (egd) with propofol N/A 12/17/2013    DPO:EUMPNTI antral erosions and petechiae. Small hiatal hernia. No endoscopic explanation for patient's symptoms   Family History  Problem Relation Age of Onset  . Colon cancer Other    . Hypertension Mother   . Stroke Father     deceased at age 23  . Heart disease Sister   . Anesthesia problems Neg Hx   . Hypotension Neg Hx   . Malignant hyperthermia Neg Hx   . Pseudochol deficiency Neg Hx    History  Substance Use Topics  . Smoking status: Current Every Day Smoker -- 0.80 packs/day for 30 years    Types: Cigarettes    Start date: 05/18/2012  . Smokeless tobacco: Never Used     Comment: cutting back,  . 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     Allergies  Cephalexin; Metronidazole; Orange; Shrimp; Penicillins; Sulfonamide derivatives; Glipizide; and Sulfamethoxazole-trimethoprim  Home Medications   Prior to Admission medications   Medication Sig Start Date End Date Taking? Authorizing Provider  acetaminophen (TYLENOL) 500 MG tablet Take 1,000 mg by mouth every 6 (six) hours as needed for mild pain.   Yes Historical Provider, MD  albuterol (PROVENTIL HFA;VENTOLIN HFA) 108 (90 BASE) MCG/ACT inhaler Inhale 2 puffs into the lungs 3 (three) times daily as needed for wheezing or shortness of breath. 10/27/12  Yes Rigoberto Noel, MD  diclofenac sodium (VOLTAREN) 1 % GEL Apply 2 g topically 4 (four) times daily as needed (Pain).   Yes Historical Provider, MD  esomeprazole (NEXIUM) 40 MG capsule Take 1 capsule (40 mg total) by mouth 2 (two) times daily before a meal. 02/10/14  Yes Wilber Oliphant, MD  ferrous sulfate 325 (65 FE) MG tablet Take 1 tablet (325 mg total) by mouth 3 (three) times daily with meals. 12/10/13 12/10/14 Yes Marjan Rabbani, MD  fluticasone (CUTIVATE) 0.05 % cream Apply topically 2 (two) times daily. 10/19/13  Yes Billy Fischer, MD  hydrOXYzine (ATARAX/VISTARIL) 10 MG tablet Take 1 tablet (10 mg total) by mouth 2 (two)  times daily as needed for anxiety. 12/07/13  Yes Juluis Mire, MD  ibuprofen (ADVIL,MOTRIN) 200 MG tablet Take 400 mg by mouth every 6 (six) hours as needed for mild pain.   Yes Historical Provider, MD  Insulin Glargine (LANTUS SOLOSTAR) 100 UNIT/ML Solostar Pen Inject 22 Units into the skin at bedtime. 12/07/13  Yes Marjan Rabbani, MD  lisinopril (PRINIVIL,ZESTRIL) 20 MG tablet Take 1 tablet (20 mg total) by mouth daily. 02/10/14  Yes Wilber Oliphant, MD  loratadine-pseudoephedrine (CLARITIN-D 24-HOUR) 10-240 MG per 24 hr tablet Take 1 tablet by mouth daily as needed for allergies.   Yes Historical Provider, MD  lovastatin (MEVACOR) 20 MG tablet Take 1 tablet (20 mg total) by mouth at bedtime. 09/01/13  Yes Othella Boyer, MD  metFORMIN (GLUCOPHAGE) 1000 MG tablet Take  1,000 mg by mouth 2 (two) times daily with a meal.   Yes Historical Provider, MD  metoprolol (LOPRESSOR) 50 MG tablet Take 50 mg by mouth 2 (two) times daily.   Yes Historical Provider, MD  potassium chloride (K-DUR) 10 MEQ tablet Take 1 tablet (10 mEq total) by mouth daily. 12/24/13  Yes Marjan Rabbani, MD  sucralfate (CARAFATE) 1 G tablet Take 1 g by mouth 4 (four) times daily -  with meals and at bedtime.   Yes Historical Provider, MD  ziprasidone (GEODON) 80 MG capsule Take 1 capsule (80 mg total) by mouth 2 (two) times daily with a meal. 03/26/12  Yes Waylan Boga, NP  diclofenac sodium (VOLTAREN) 1 % GEL Apply 2 g topically 4 (four) times daily. Patient not taking: Reported on 02/21/2014 01/01/14   Tasrif Ahmed, MD   BP 187/91 mmHg  Pulse 91  Temp(Src) 99.7 F (37.6 C) (Oral)  Resp 18  Ht 5\' 4"  (1.626 m)  Wt 215 lb (97.523 kg)  BMI 36.89 kg/m2  SpO2 100%  LMP 02/21/2014  Vital signs normal except for hypertension  Physical Exam  Constitutional: She is oriented to person, place, and time. She appears well-developed and well-nourished.  Non-toxic appearance. She does not appear ill. No distress.  HENT:  Head:  Normocephalic and atraumatic.  Right Ear: External ear normal.  Left Ear: External ear normal.  Nose: Nose normal. No mucosal edema or rhinorrhea.  Mouth/Throat: Oropharynx is clear and moist and mucous membranes are normal. No dental abscesses or uvula swelling.  Patient has mild bogginess in the left nostril. Her maxillary sinuses are nontender. She has some tenderness of her forehead bilaterally without swelling, crepitance, or bogginess. Tongue is very dry.  Eyes: Conjunctivae and EOM are normal. Pupils are equal, round, and reactive to light.  Neck: Normal range of motion and full passive range of motion without pain. Neck supple.  Cardiovascular: Normal rate, regular rhythm and normal heart sounds.  Exam reveals no gallop and no friction rub.   No murmur heard. Pulmonary/Chest: Effort normal and breath sounds normal. No respiratory distress. She has no wheezes. She has no rhonchi. She has no rales. She exhibits no tenderness and no crepitus.  Abdominal: Soft. Normal appearance and bowel sounds are normal. She exhibits no distension. There is no tenderness. There is no rebound and no guarding.  Musculoskeletal: Normal range of motion. She exhibits no edema or tenderness.  Moves all extremities well.   Neurological: She is alert and oriented to person, place, and time. She has normal strength. No cranial nerve deficit.  Skin: Skin is warm, dry and intact. No rash noted. No erythema. No pallor.  Psychiatric: She has a normal mood and affect. Her speech is normal and behavior is normal. Her mood appears not anxious.  Nursing note and vitals reviewed.   ED Course  Procedures (including critical care time)  Medications  labetalol (NORMODYNE,TRANDATE) injection 20 mg (not administered)  sodium chloride 0.9 % bolus 2,000 mL (2,000 mLs Intravenous New Bag/Given 02/21/14 1919)  metoCLOPramide (REGLAN) injection 10 mg (10 mg Intravenous Given 02/21/14 1919)  diphenhydrAMINE (BENADRYL) injection  25 mg (25 mg Intravenous Given 02/21/14 1923)  LORazepam (ATIVAN) injection 1 mg (1 mg Intravenous Given 02/21/14 2110)  magnesium sulfate IVPB 2 g 50 mL (0 g Intravenous Stopped 02/21/14 2135)  ondansetron (ZOFRAN) injection 4 mg (4 mg Intravenous Given 02/21/14 2106)  diphenhydrAMINE (BENADRYL) injection 25 mg (25 mg Intravenous Given 02/21/14 2112)   Patient continues to  have borderline high blood pressure that requires treatment. She was given labetalol. When I rechecked her 20:00 her friend stated she thought the patient was doing better however the patient states she was not. She was given the results of her CT scan.  22:40 Pt states she started back on her lasix 4 days ago but it hasn't been helping with her BP. States she has an appt on Wednesday (in 3 days) at the [facility]. Advised to call to see if she could be seen sooner in the walk-in.   22:45 Pt left with Dr Alvino Chapel to recheck and discharge after her labetolol.   Review of her outpatient clinic notes from 10/28 shows the blood pressure readings below. They stopped her Lasix and continued her increased dose of lisinopril at 20 mg a day. They also wanted her to continue her Lopressor 50 mg twice a day. They discuss if she continues to have elevation of her blood pressure they would consider increasing her ACE inhibitor and if she was having fluid problems starting her on a thiazide diuretic.   BP Readings from Last 3 Encounters:  02/10/14 136/80  01/25/14 186/90  01/19/14 162/87     Labs Review Labs Reviewed - No data to display  Imaging Review Ct Head Wo Contrast  02/21/2014   CLINICAL DATA:  Headache.  EXAM: CT HEAD WITHOUT CONTRAST  TECHNIQUE: Contiguous axial images were obtained from the base of the skull through the vertex without intravenous contrast.  COMPARISON:  07/07/2011  FINDINGS: Ventricles, cisterns and other CSF spaces are within normal. There is no mass, mass effect, shift of midline structures or acute  hemorrhage. No evidence of acute infarction. Mild streak artifact is present from the external metallic structure. Remaining bones and soft tissues are unremarkable.  IMPRESSION: No acute intracranial findings.   Electronically Signed   By: Marin Olp M.D.   On: 02/21/2014 21:55     EKG Interpretation None      MDM   Final diagnoses:  Headache  Essential hypertension    Plan discharge  Rolland Porter, MD, Alanson Aly, MD 02/21/14 2244

## 2014-02-21 NOTE — Discharge Instructions (Signed)
General Headache Without Cause °A headache is pain or discomfort felt around the head or neck area. The specific cause of a headache may not be found. There are many causes and types of headaches. A few common ones are: °· Tension headaches. °· Migraine headaches. °· Cluster headaches. °· Chronic daily headaches. °HOME CARE INSTRUCTIONS  °· Keep all follow-up appointments with your caregiver or any specialist referral. °· Only take over-the-counter or prescription medicines for pain or discomfort as directed by your caregiver. °· Lie down in a dark, quiet room when you have a headache. °· Keep a headache journal to find out what may trigger your migraine headaches. For example, write down: °· What you eat and drink. °· How much sleep you get. °· Any change to your diet or medicines. °· Try massage or other relaxation techniques. °· Put ice packs or heat on the head and neck. Use these 3 to 4 times per day for 15 to 20 minutes each time, or as needed. °· Limit stress. °· Sit up straight, and do not tense your muscles. °· Quit smoking if you smoke. °· Limit alcohol use. °· Decrease the amount of caffeine you drink, or stop drinking caffeine. °· Eat and sleep on a regular schedule. °· Get 7 to 9 hours of sleep, or as recommended by your caregiver. °· Keep lights dim if bright lights bother you and make your headaches worse. °SEEK MEDICAL CARE IF:  °· You have problems with the medicines you were prescribed. °· Your medicines are not working. °· You have a change from the usual headache. °· You have nausea or vomiting. °SEEK IMMEDIATE MEDICAL CARE IF:  °· Your headache becomes severe. °· You have a fever. °· You have a stiff neck. °· You have loss of vision. °· You have muscular weakness or loss of muscle control. °· You start losing your balance or have trouble walking. °· You feel faint or pass out. °· You have severe symptoms that are different from your first symptoms. °MAKE SURE YOU:  °· Understand these  instructions. °· Will watch your condition. °· Will get help right away if you are not doing well or get worse. °Document Released: 04/02/2005 Document Revised: 06/25/2011 Document Reviewed: 04/18/2011 °ExitCare® Patient Information ©2015 [employer]. This information is not intended to replace advice given to you by your health care provider. Make sure you discuss any questions you have with your health care provider. °Hypertension °Hypertension, commonly called high blood pressure, is when the force of blood pumping through your arteries is too strong. Your arteries are the blood vessels that carry blood from your heart throughout your body. A blood pressure reading consists of a higher number over a lower number, such as 110/72. The higher number (systolic) is the pressure inside your arteries when your heart pumps. The lower number (diastolic) is the pressure inside your arteries when your heart relaxes. Ideally you want your blood pressure below 120/80. °Hypertension forces your heart to work harder to pump blood. Your arteries may become narrow or stiff. Having hypertension puts you at risk for heart disease, stroke, and other problems.  °RISK FACTORS °Some risk factors for high blood pressure are controllable. Others are not.  °Risk factors you cannot control include:  °· Race. You may be at higher risk if you are African American. °· Age. Risk increases with age. °· Gender. Men are at higher risk than women before age 45 years. After age 65, women are at higher risk than   men. °Risk factors you can control include: °· Not getting enough exercise or physical activity. °· Being overweight. °· Getting too much fat, sugar, calories, or salt in your diet. °· Drinking too much alcohol. °SIGNS AND SYMPTOMS °Hypertension does not usually cause signs or symptoms. Extremely high blood pressure (hypertensive crisis) may cause headache, anxiety, shortness of breath, and nosebleed. °DIAGNOSIS  °To check if you have  hypertension, your health care provider will measure your blood pressure while you are seated, with your arm held at the level of your heart. It should be measured at least twice using the same arm. Certain conditions can cause a difference in blood pressure between your right and left arms. A blood pressure reading that is higher than normal on one occasion does not mean that you need treatment. If one blood pressure reading is high, ask your health care provider about having it checked again. °TREATMENT  °Treating high blood pressure includes making lifestyle changes and possibly taking medicine. Living a healthy lifestyle can help lower high blood pressure. You may need to change some of your habits. °Lifestyle changes may include: °· Following the DASH diet. This diet is high in fruits, vegetables, and whole grains. It is low in salt, red meat, and added sugars. °· Getting at least 2½ hours of brisk physical activity every week. °· Losing weight if necessary. °· Not smoking. °· Limiting alcoholic beverages. °· Learning ways to reduce stress. ° If lifestyle changes are not enough to get your blood pressure under control, your health care provider may prescribe medicine. You may need to take more than one. Work closely with your health care provider to understand the risks and benefits. °HOME CARE INSTRUCTIONS °· Have your blood pressure rechecked as directed by your health care provider.   °· Take medicines only as directed by your health care provider. Follow the directions carefully. Blood pressure medicines must be taken as prescribed. The medicine does not work as well when you skip doses. Skipping doses also puts you at risk for problems.   °· Do not smoke.   °· Monitor your blood pressure at home as directed by your health care provider.  °SEEK MEDICAL CARE IF:  °· You think you are having a reaction to medicines taken. °· You have recurrent headaches or feel dizzy. °· You have swelling in your  ankles. °· You have trouble with your vision. °SEEK IMMEDIATE MEDICAL CARE IF: °· You develop a severe headache or confusion. °· You have unusual weakness, numbness, or feel faint. °· You have severe chest or abdominal pain. °· You vomit repeatedly. °· You have trouble breathing. °MAKE SURE YOU:  °· Understand these instructions. °· Will watch your condition. °· Will get help right away if you are not doing well or get worse. °Document Released: 04/02/2005 Document Revised: 08/17/2013 Document Reviewed: 01/23/2013 °ExitCare® Patient Information ©2015 [employer]. This information is not intended to replace advice given to you by your health care provider. Make sure you discuss any questions you have with your health care provider. ° °

## 2014-02-22 ENCOUNTER — Ambulatory Visit (INDEPENDENT_AMBULATORY_CARE_PROVIDER_SITE_OTHER): Payer: Medicare Other | Admitting: Internal Medicine

## 2014-02-22 ENCOUNTER — Encounter: Payer: Self-pay | Admitting: Internal Medicine

## 2014-02-22 ENCOUNTER — Telehealth: Payer: Self-pay | Admitting: *Deleted

## 2014-02-22 VITALS — BP 167/97 | HR 82 | Temp 98.1°F | Wt 215.4 lb

## 2014-02-22 DIAGNOSIS — I10 Essential (primary) hypertension: Secondary | ICD-10-CM

## 2014-02-22 DIAGNOSIS — J302 Other seasonal allergic rhinitis: Secondary | ICD-10-CM | POA: Diagnosis not present

## 2014-02-22 DIAGNOSIS — R51 Headache: Secondary | ICD-10-CM | POA: Diagnosis not present

## 2014-02-22 DIAGNOSIS — R519 Headache, unspecified: Secondary | ICD-10-CM

## 2014-02-22 MED ORDER — LISINOPRIL 40 MG PO TABS: 40.0000 mg | ORAL_TABLET | Freq: Every day | ORAL | Status: DC

## 2014-02-22 MED ORDER — CETIRIZINE HCL 10 MG PO CAPS: 10.0000 mg | ORAL_CAPSULE | Freq: Every day | ORAL | Status: DC | PRN

## 2014-02-22 MED ORDER — METOPROLOL TARTRATE 100 MG PO TABS: 100.0000 mg | ORAL_TABLET | Freq: Two times a day (BID) | ORAL | Status: DC

## 2014-02-22 MED ORDER — MOMETASONE FUROATE 50 MCG/ACT NA SUSP: 2.0000 | Freq: Two times a day (BID) | NASAL | Status: DC

## 2014-02-22 NOTE — Telephone Encounter (Signed)
Pt called c/o of h/a for weeks. Went to ER yesterday - no change. Has had nonproductive cough. Appt made for today 2:15PM Dr Aundra Dubin. Pt aware. Hilda Blades Janeisha Ryle RN 02/22/14 9:40AM

## 2014-02-22 NOTE — Patient Instructions (Addendum)
General Instructions: Please increase your Lisinopril to 40 mg daily (Take 2-20 mg tablets or 1 -40 mg tablet) and Metoprolol to 100 mg twice a day. Continue Lasix 20 mg daily  Follow up in 2 weeks for blood pressure and continue to log  You can take Tylenol or Advil as needed for headache Read information below     Thank you for bringing your medicines today. This helps [name] keep you safe from mistakes.   Progress Toward Treatment Goals:  Treatment Goal 02/22/2014  Hemoglobin A1C at goal  Blood pressure deteriorated  Stop smoking smoking the same amount    Self Care Goals & Plans:  Self Care Goal 02/22/2014  Manage my medications take my medicines as prescribed; bring my medications to every visit; refill my medications on time; follow the sick day instructions if I am sick  Monitor my health keep track of my blood glucose; bring my glucose meter and log to each visit; keep track of my blood pressure; bring my blood pressure log to each visit; keep track of my weight; check my feet daily  Eat healthy foods drink diet soda or water instead of juice or soda; eat more vegetables; eat foods that are low in salt; eat baked foods instead of fried foods; eat fruit for snacks and desserts; eat smaller portions  Be physically active find an activity I enjoy  Stop smoking call [facility] (1-800-QUIT-NOW)  Meeting treatment goals maintain the current self-care plan    Home Blood Glucose Monitoring 02/22/2014  Check my blood sugar 3 times a day  When to check my blood sugar before meals     Care Management & Community Referrals:  Referral 02/22/2014  Referrals made for care management support none needed  Referrals made to community resources none         Treatment Goals:  Goals (1 Years of Data) as of 02/22/14          As of Today As of Today 02/21/14 02/21/14 02/21/14     Blood Pressure   . Blood Pressure < 140/90  167/97 178/89 180/88 161/83 173/104     Result Component   .  HEMOGLOBIN A1C < 7.0         . LDL CALC < 100            Progress Toward Treatment Goals:  Treatment Goal 02/22/2014  Hemoglobin A1C at goal  Blood pressure deteriorated  Stop smoking smoking the same amount    Self Care Goals & Plans:  Self Care Goal 02/22/2014  Manage my medications take my medicines as prescribed; bring my medications to every visit; refill my medications on time; follow the sick day instructions if I am sick  Monitor my health keep track of my blood glucose; bring my glucose meter and log to each visit; keep track of my blood pressure; bring my blood pressure log to each visit; keep track of my weight; check my feet daily  Eat healthy foods drink diet soda or water instead of juice or soda; eat more vegetables; eat foods that are low in salt; eat baked foods instead of fried foods; eat fruit for snacks and desserts; eat smaller portions  Be physically active find an activity I enjoy  Stop smoking call [facility] (1-800-QUIT-NOW)  Meeting treatment goals maintain the current self-care plan    Home Blood Glucose Monitoring 02/22/2014  Check my blood sugar 3 times a day  When to check my blood sugar before meals  Care Management & Community Referrals:  Referral 02/22/2014  Referrals made for care management support none needed  Referrals made to community resources none    Hypertension Hypertension, commonly called high blood pressure, is when the force of blood pumping through your arteries is too strong. Your arteries are the blood vessels that carry blood from your heart throughout your body. A blood pressure reading consists of a higher number over a lower number, such as 110/72. The higher number (systolic) is the pressure inside your arteries when your heart pumps. The lower number (diastolic) is the pressure inside your arteries when your heart relaxes. Ideally you want your blood pressure below 120/80. Hypertension forces your heart to work harder to  pump blood. Your arteries may become narrow or stiff. Having hypertension puts you at risk for heart disease, stroke, and other problems.  RISK FACTORS Some risk factors for high blood pressure are controllable. Others are not.  Risk factors you cannot control include:   Race. You may be at higher risk if you are African American.  Age. Risk increases with age.  Gender. Men are at higher risk than women before age 67 years. After age 35, women are at higher risk than men. Risk factors you can control include:  Not getting enough exercise or physical activity.  Being overweight.  Getting too much fat, sugar, calories, or salt in your diet.  Drinking too much alcohol. SIGNS AND SYMPTOMS Hypertension does not usually cause signs or symptoms. Extremely high blood pressure (hypertensive crisis) may cause headache, anxiety, shortness of breath, and nosebleed. DIAGNOSIS  To check if you have hypertension, your health care provider will measure your blood pressure while you are seated, with your arm held at the level of your heart. It should be measured at least twice using the same arm. Certain conditions can cause a difference in blood pressure between your right and left arms. A blood pressure reading that is higher than normal on one occasion does not mean that you need treatment. If one blood pressure reading is high, ask your health care provider about having it checked again. TREATMENT  Treating high blood pressure includes making lifestyle changes and possibly taking medicine. Living a healthy lifestyle can help lower high blood pressure. You may need to change some of your habits. Lifestyle changes may include:  Following the DASH diet. This diet is high in fruits, vegetables, and whole grains. It is low in salt, red meat, and added sugars.  Getting at least 2 hours of brisk physical activity every week.  Losing weight if necessary.  Not smoking.  Limiting alcoholic  beverages.  Learning ways to reduce stress. If lifestyle changes are not enough to get your blood pressure under control, your health care provider may prescribe medicine. You may need to take more than one. Work closely with your health care provider to understand the risks and benefits. HOME CARE INSTRUCTIONS  Have your blood pressure rechecked as directed by your health care provider.   Take medicines only as directed by your health care provider. Follow the directions carefully. Blood pressure medicines must be taken as prescribed. The medicine does not work as well when you skip doses. Skipping doses also puts you at risk for problems.   Do not smoke.   Monitor your blood pressure at home as directed by your health care provider. SEEK MEDICAL CARE IF:   You think you are having a reaction to medicines taken.  You have recurrent headaches or feel  dizzy.  You have swelling in your ankles.  You have trouble with your vision. SEEK IMMEDIATE MEDICAL CARE IF:  You develop a severe headache or confusion.  You have unusual weakness, numbness, or feel faint.  You have severe chest or abdominal pain.  You vomit repeatedly.  You have trouble breathing. MAKE SURE YOU:   Understand these instructions.  Will watch your condition.  Will get help right away if you are not doing well or get worse. Document Released: 04/02/2005 Document Revised: 08/17/2013 Document Reviewed: 01/23/2013 [facility] Patient Information 2015 [place], [place]. This information is not intended to replace advice given to you by your health care provider. Make sure you discuss any questions you have with your health care provider.  Headaches, Frequently Asked Questions MIGRAINE HEADACHES Q: What is migraine? What causes it? How can I treat it? A: Generally, migraine headaches begin as a dull ache. Then they develop into a constant, throbbing, and pulsating pain. You may experience pain at the temples. You  may experience pain at the front or back of one or both sides of the head. The pain is usually accompanied by a combination of:  Nausea.  Vomiting.  Sensitivity to light and noise. Some people (about 15%) experience an aura (see below) before an attack. The cause of migraine is believed to be chemical reactions in the brain. Treatment for migraine may include over-the-counter or prescription medications. It may also include self-help techniques. These include relaxation training and biofeedback.  Q: What is an aura? A: About 15% of people with migraine get an "aura". This is a sign of neurological symptoms that occur before a migraine headache. You may see wavy or jagged lines, dots, or flashing lights. You might experience tunnel vision or blind spots in one or both eyes. The aura can include visual or auditory hallucinations (something imagined). It may include disruptions in smell (such as strange odors), taste or touch. Other symptoms include:  Numbness.  A "pins and needles" sensation.  Difficulty in recalling or speaking the correct word. These neurological events may last as long as 60 minutes. These symptoms will fade as the headache begins. Q: What is a trigger? A: Certain physical or environmental factors can lead to or "trigger" a migraine. These include:  Foods.  Hormonal changes.  Weather.  Stress. It is important to remember that triggers are different for everyone. To help prevent migraine attacks, you need to figure out which triggers affect you. Keep a headache diary. This is a good way to track triggers. The diary will help you talk to your healthcare professional about your condition. Q: Does weather affect migraines? A: Bright sunshine, hot, humid conditions, and drastic changes in barometric pressure may lead to, or "trigger," a migraine attack in some people. But studies have shown that weather does not act as a trigger for everyone with migraines. Q: What is the  link between migraine and hormones? A: Hormones start and regulate many of your body's functions. Hormones keep your body in balance within a constantly changing environment. The levels of hormones in your body are unbalanced at times. Examples are during menstruation, pregnancy, or menopause. That can lead to a migraine attack. In fact, about three quarters of all women with migraine report that their attacks are related to the menstrual cycle.  Q: Is there an increased risk of stroke for migraine sufferers? A: The likelihood of a migraine attack causing a stroke is very remote. That is not to say that migraine sufferers  cannot have a stroke associated with their migraines. In persons under age 74, the most common associated factor for stroke is migraine headache. But over the course of a person's normal life span, the occurrence of migraine headache may actually be associated with a reduced risk of dying from cerebrovascular disease due to stroke.  Q: What are acute medications for migraine? A: Acute medications are used to treat the pain of the headache after it has started. Examples over-the-counter medications, NSAIDs, ergots, and triptans.  Q: What are the triptans? A: Triptans are the newest class of abortive medications. They are specifically targeted to treat migraine. Triptans are vasoconstrictors. They moderate some chemical reactions in the brain. The triptans work on receptors in your brain. Triptans help to restore the balance of a neurotransmitter called serotonin. Fluctuations in levels of serotonin are thought to be a main cause of migraine.  Q: Are over-the-counter medications for migraine effective? A: Over-the-counter, or "OTC," medications may be effective in relieving mild to moderate pain and associated symptoms of migraine. But you should see your caregiver before beginning any treatment regimen for migraine.  Q: What are preventive medications for migraine? A: Preventive  medications for migraine are sometimes referred to as "prophylactic" treatments. They are used to reduce the frequency, severity, and length of migraine attacks. Examples of preventive medications include antiepileptic medications, antidepressants, beta-blockers, calcium channel blockers, and NSAIDs (nonsteroidal anti-inflammatory drugs). Q: Why are anticonvulsants used to treat migraine? A: During the past few years, there has been an increased interest in antiepileptic drugs for the prevention of migraine. They are sometimes referred to as "anticonvulsants". Both epilepsy and migraine may be caused by similar reactions in the brain.  Q: Why are antidepressants used to treat migraine? A: Antidepressants are typically used to treat people with depression. They may reduce migraine frequency by regulating chemical levels, such as serotonin, in the brain.  Q: What alternative therapies are used to treat migraine? A: The term "alternative therapies" is often used to describe treatments considered outside the scope of conventional Western medicine. Examples of alternative therapy include acupuncture, acupressure, and yoga. Another common alternative treatment is herbal therapy. Some herbs are believed to relieve headache pain. Always discuss alternative therapies with your caregiver before proceeding. Some herbal products contain arsenic and other toxins. TENSION HEADACHES Q: What is a tension-type headache? What causes it? How can I treat it? A: Tension-type headaches occur randomly. They are often the result of temporary stress, anxiety, fatigue, or anger. Symptoms include soreness in your temples, a tightening band-like sensation around your head (a "vice-like" ache). Symptoms can also include a pulling feeling, pressure sensations, and contracting head and neck muscles. The headache begins in your forehead, temples, or the back of your head and neck. Treatment for tension-type headache may include  over-the-counter or prescription medications. Treatment may also include self-help techniques such as relaxation training and biofeedback. CLUSTER HEADACHES Q: What is a cluster headache? What causes it? How can I treat it? A: Cluster headache gets its name because the attacks come in groups. The pain arrives with little, if any, warning. It is usually on one side of the head. A tearing or bloodshot eye and a runny nose on the same side of the headache may also accompany the pain. Cluster headaches are believed to be caused by chemical reactions in the brain. They have been described as the most severe and intense of any headache type. Treatment for cluster headache includes prescription medication and oxygen. SINUS HEADACHES  Q: What is a sinus headache? What causes it? How can I treat it? A: When a cavity in the bones of the face and skull (a sinus) becomes inflamed, the inflammation will cause localized pain. This condition is usually the result of an allergic reaction, a tumor, or an infection. If your headache is caused by a sinus blockage, such as an infection, you will probably have a fever. An x-ray will confirm a sinus blockage. Your caregiver's treatment might include antibiotics for the infection, as well as antihistamines or decongestants.  REBOUND HEADACHES Q: What is a rebound headache? What causes it? How can I treat it? A: A pattern of taking acute headache medications too often can lead to a condition known as "rebound headache." A pattern of taking too much headache medication includes taking it more than 2 days per week or in excessive amounts. That means more than the label or a caregiver advises. With rebound headaches, your medications not only stop relieving pain, they actually begin to cause headaches. Doctors treat rebound headache by tapering the medication that is being overused. Sometimes your caregiver will gradually substitute a different type of treatment or medication.  Stopping may be a challenge. Regularly overusing a medication increases the potential for serious side effects. Consult a caregiver if you regularly use headache medications more than 2 days per week or more than the label advises. ADDITIONAL QUESTIONS AND ANSWERS Q: What is biofeedback? A: Biofeedback is a self-help treatment. Biofeedback uses special equipment to monitor your body's involuntary physical responses. Biofeedback monitors:  Breathing.  Pulse.  Heart rate.  Temperature.  Muscle tension.  Brain activity. Biofeedback helps you refine and perfect your relaxation exercises. You learn to control the physical responses that are related to stress. Once the technique has been mastered, you do not need the equipment any more. Q: Are headaches hereditary? A: Four out of five (80%) of people that suffer report a family history of migraine. Scientists are not sure if this is genetic or a family predisposition. Despite the uncertainty, a child has a 50% chance of having migraine if one parent suffers. The child has a 75% chance if both parents suffer.  Q: Can children get headaches? A: By the time they reach high school, most young people have experienced some type of headache. Many safe and effective approaches or medications can prevent a headache from occurring or stop it after it has begun.  Q: What type of doctor should I see to diagnose and treat my headache? A: Start with your primary caregiver. Discuss his or her experience and approach to headaches. Discuss methods of classification, diagnosis, and treatment. Your caregiver may decide to recommend you to a headache specialist, depending upon your symptoms or other physical conditions. Having diabetes, allergies, etc., may require a more comprehensive and inclusive approach to your headache. The National Headache Foundation will provide, upon request, a list of [facility] physician members in your state. Document Released: 06/23/2003  Document Revised: 06/25/2011 Document Reviewed: 12/01/2007 [facility] Patient Information 2015 [place], [place]. This information is not intended to replace advice given to you by your health care provider. Make sure you discuss any questions you have with your health care provider.

## 2014-02-22 NOTE — Progress Notes (Signed)
Quick Note:  Please let patient know her gb looks ok. She has a tiny hemangioma in the liver.  How is her N/V, abd pain? ______

## 2014-02-22 NOTE — Progress Notes (Signed)
   Subjective:    Patient ID: [name], female    DOB: 1966-01-22, 48 y.o.   MRN: [id number]  HPI Comments: 48 y.o PMH HTN, tobacco abuse, pulm HTN  She presents for f/u 1. She has HTN and BP recently uncontrolled with home readings 130s-190s/80s-115. Today BP 174/89 with repeat 167/97.  She recently went to the ED for h/a and elevated BP 187/91 but CT scan head was negative. BP in the ED 02/21/14 was 187/91.  H/a's are improved no h/a today but BP still variable.   She also experienced vomiting yesterday with the h/a relieved by Zofran.  She was taking Lisinopril 20 mg qd (recently increased to 20 mg at last [facility] visit from 5 mg), Lopressor 50 mg bid, she just resumed taking Lasix 20 mg qd (and tolerating w/o side effects though listed sulfa allergy).  2. H/a-noted x 1 week daily frontal and temporal distribution and eye and nose.  Denies fever/chills/nasal congestion. She vomited yesterday with the h/a.  At times h/as were aggravated by light, sound and wind blowing.  She took Tylenol and Advil 2-3 x per day with intermittent relief.   H/a yesterday was 10/10 throbbing.  She was taking Tylenol #3 w/o relief of h/a's and stated it made her feel weird.   3. Dry cough taking OTC medication   SH -smoking 1 ppd, son with her at the visit   FH-denies FH migraines, mother with HTN, dad died with stroke age 63 y.o also had HTN.       Review of Systems  HENT: Positive for sinus pressure. Negative for congestion.        Intermittent sinus ttp  Respiratory: Positive for cough. Negative for shortness of breath.        Dry cough taking nasal decongestant w/o psedoephedrine OTC rec by pharmacist.   Cardiovascular: Negative for chest pain.  Neurological: Negative for headaches.       Objective:   Physical Exam  Constitutional: She is oriented to person, place, and time. She appears well-developed and well-nourished. No distress.  HENT:  Head: Normocephalic and atraumatic.  Mouth/Throat:  Oropharynx is clear and moist. No oropharyngeal exudate.  Mild frontal, ethmoid ttp   Eyes: Conjunctivae are normal. Pupils are equal, round, and reactive to light. Right eye exhibits no discharge. Left eye exhibits no discharge. No scleral icterus.  Cardiovascular: Normal rate, regular rhythm, S1 normal, S2 normal and normal heart sounds.   No murmur heard. No lower ext edema   Pulmonary/Chest: Effort normal and breath sounds normal. No respiratory distress. She has no wheezes.  Abdominal: Soft. Bowel sounds are normal. There is no tenderness.  Neurological: She is alert and oriented to person, place, and time. She has normal strength. No cranial nerve deficit or sensory deficit. Gait normal.  CN 2-12 grossly intact,  Moving all 4 extremities  Skin: Skin is warm and dry. No rash noted. She is not diaphoretic.  Psychiatric: She has a normal mood and affect. Her speech is normal and behavior is normal. Judgment and thought content normal. Cognition and memory are normal.  Nursing note and vitals reviewed.         Assessment & Plan:  F/u 2 weeks prn for BP if still elevated

## 2014-02-23 DIAGNOSIS — J302 Other seasonal allergic rhinitis: Secondary | ICD-10-CM | POA: Insufficient documentation

## 2014-02-23 NOTE — Assessment & Plan Note (Signed)
Counseled on cessation does not seem interested in quiting at this time

## 2014-02-23 NOTE — Progress Notes (Signed)
INTERNAL MEDICINE TEACHING ATTENDING ADDENDUM - Chad Tiznado, MD: I reviewed and discussed at the time of visit with the resident Dr. McLean, the patient's medical history, physical examination, diagnosis and results of pertinent tests and treatment and I agree with the patient's care as documented.  

## 2014-02-23 NOTE — Assessment & Plan Note (Signed)
Likely related to uncontrolled HTN, could be also sinus inflammation though no active sinsusitis Changed BP meds increased to control BP Can take prn Tylenol or Advil for h/a Will try restarting Nasonex and zyrtec for allergies.

## 2014-02-23 NOTE — Assessment & Plan Note (Signed)
BP Readings from Last 3 Encounters:  02/22/14 167/97  02/21/14 180/88  02/10/14 136/80    Lab Results  Component Value Date   NA 136 02/10/2014   K 3.8 02/10/2014   CREATININE 0.79 02/10/2014    Assessment: Blood pressure control: moderately elevated Progress toward BP goal:  deteriorated Comments: uncontrolled   Plan: Medications:  continue current medications (Lisinopril 20 mg increased to 40 mg, Lopressor 50 mg bid increased to 100 mg bid, continue Lasix 20 mg qd), May need to add low dose Norvasc in future if still not controlled.  Educational resources provided: yes  Self management tools provided: yes Other plans: f/u in 2 weeks prn

## 2014-02-23 NOTE — Assessment & Plan Note (Signed)
Rx Nasonex and Zyrtec

## 2014-02-24 ENCOUNTER — Ambulatory Visit: Payer: Self-pay | Admitting: Internal Medicine

## 2014-03-08 ENCOUNTER — Ambulatory Visit: Payer: Self-pay | Admitting: Pulmonary Disease

## 2014-03-09 NOTE — Progress Notes (Signed)
Quick Note:  We can offer HIDA to complete gb work-up. If negative, may need to consider gastroparesis. ______

## 2014-03-10 ENCOUNTER — Other Ambulatory Visit: Payer: Self-pay

## 2014-03-10 DIAGNOSIS — R109 Unspecified abdominal pain: Secondary | ICD-10-CM

## 2014-03-10 DIAGNOSIS — R110 Nausea: Secondary | ICD-10-CM

## 2014-03-10 NOTE — Progress Notes (Signed)
Hida scan is schelduled for 03/18/2014 @ 10am. Called and spoke with pt and she is aware of her new appointment.

## 2014-03-14 ENCOUNTER — Emergency Department (HOSPITAL_COMMUNITY)
Admission: EM | Admit: 2014-03-14 | Discharge: 2014-03-14 | Disposition: A | Discharge status: Home / Self Care | Payer: Medicare Other | Attending: Emergency Medicine | Admitting: Emergency Medicine

## 2014-03-14 ENCOUNTER — Encounter (HOSPITAL_COMMUNITY): Payer: Self-pay | Admitting: Emergency Medicine

## 2014-03-14 ENCOUNTER — Emergency Department (HOSPITAL_COMMUNITY): Payer: Medicare Other

## 2014-03-14 DIAGNOSIS — R112 Nausea with vomiting, unspecified: Secondary | ICD-10-CM | POA: Diagnosis not present

## 2014-03-14 DIAGNOSIS — Z792 Long term (current) use of antibiotics: Secondary | ICD-10-CM | POA: Diagnosis not present

## 2014-03-14 DIAGNOSIS — Z7951 Long term (current) use of inhaled steroids: Secondary | ICD-10-CM | POA: Insufficient documentation

## 2014-03-14 DIAGNOSIS — Z794 Long term (current) use of insulin: Secondary | ICD-10-CM | POA: Diagnosis not present

## 2014-03-14 DIAGNOSIS — E785 Hyperlipidemia, unspecified: Secondary | ICD-10-CM | POA: Diagnosis not present

## 2014-03-14 DIAGNOSIS — B349 Viral infection, unspecified: Secondary | ICD-10-CM | POA: Insufficient documentation

## 2014-03-14 DIAGNOSIS — I2510 Atherosclerotic heart disease of native coronary artery without angina pectoris: Secondary | ICD-10-CM | POA: Insufficient documentation

## 2014-03-14 DIAGNOSIS — R05 Cough: Secondary | ICD-10-CM

## 2014-03-14 DIAGNOSIS — F259 Schizoaffective disorder, unspecified: Secondary | ICD-10-CM | POA: Insufficient documentation

## 2014-03-14 DIAGNOSIS — R197 Diarrhea, unspecified: Secondary | ICD-10-CM

## 2014-03-14 DIAGNOSIS — I10 Essential (primary) hypertension: Secondary | ICD-10-CM | POA: Insufficient documentation

## 2014-03-14 DIAGNOSIS — K219 Gastro-esophageal reflux disease without esophagitis: Secondary | ICD-10-CM | POA: Diagnosis not present

## 2014-03-14 DIAGNOSIS — I519 Heart disease, unspecified: Secondary | ICD-10-CM | POA: Diagnosis not present

## 2014-03-14 DIAGNOSIS — F329 Major depressive disorder, single episode, unspecified: Secondary | ICD-10-CM | POA: Diagnosis not present

## 2014-03-14 DIAGNOSIS — Z791 Long term (current) use of non-steroidal anti-inflammatories (NSAID): Secondary | ICD-10-CM | POA: Insufficient documentation

## 2014-03-14 DIAGNOSIS — Z79899 Other long term (current) drug therapy: Secondary | ICD-10-CM | POA: Insufficient documentation

## 2014-03-14 DIAGNOSIS — Z8701 Personal history of pneumonia (recurrent): Secondary | ICD-10-CM | POA: Insufficient documentation

## 2014-03-14 DIAGNOSIS — D509 Iron deficiency anemia, unspecified: Secondary | ICD-10-CM | POA: Diagnosis not present

## 2014-03-14 DIAGNOSIS — E119 Type 2 diabetes mellitus without complications: Secondary | ICD-10-CM | POA: Diagnosis not present

## 2014-03-14 DIAGNOSIS — R0602 Shortness of breath: Secondary | ICD-10-CM | POA: Diagnosis not present

## 2014-03-14 DIAGNOSIS — R059 Cough, unspecified: Secondary | ICD-10-CM

## 2014-03-14 DIAGNOSIS — E669 Obesity, unspecified: Secondary | ICD-10-CM | POA: Diagnosis not present

## 2014-03-14 LAB — COMPREHENSIVE METABOLIC PANEL
ALBUMIN: 3.8 g/dL (ref 3.5–5.2)
ALT: 10 U/L (ref 0–35)
ANION GAP: 16 — AB (ref 5–15)
AST: 22 U/L (ref 0–37)
Alkaline Phosphatase: 77 U/L (ref 39–117)
BILIRUBIN TOTAL: 0.2 mg/dL — AB (ref 0.3–1.2)
BUN: 6 mg/dL (ref 6–23)
CHLORIDE: 100 meq/L (ref 96–112)
CO2: 22 meq/L (ref 19–32)
Calcium: 9.6 mg/dL (ref 8.4–10.5)
Creatinine, Ser: 0.76 mg/dL (ref 0.50–1.10)
GFR calc Af Amer: >90 mL/min (ref >90)
Glucose, Bld: 119 mg/dL — ABNORMAL HIGH (ref 70–99)
POTASSIUM: 3.5 meq/L — AB (ref 3.7–5.3)
Sodium: 138 mEq/L (ref 137–147)
Total Protein: 7.2 g/dL (ref 6.0–8.3)

## 2014-03-14 LAB — CBC WITH DIFFERENTIAL/PLATELET
Basophils Absolute: 0 10*3/uL (ref 0.0–0.1)
Basophils Relative: 0 % (ref 0–1)
Eosinophils Absolute: 0.1 10*3/uL (ref 0.0–0.7)
Eosinophils Relative: 1 % (ref 0–5)
HEMATOCRIT: 38.3 % (ref 36.0–46.0)
HEMOGLOBIN: 12.5 g/dL (ref 12.0–15.0)
LYMPHS PCT: 31 % (ref 12–46)
Lymphs Abs: 3.1 10*3/uL (ref 0.7–4.0)
MCH: 25.1 pg — ABNORMAL LOW (ref 26.0–34.0)
MCHC: 32.6 g/dL (ref 30.0–36.0)
MCV: 76.8 fL — ABNORMAL LOW (ref 78.0–100.0)
Monocytes Absolute: 0.6 10*3/uL (ref 0.1–1.0)
Monocytes Relative: 6 % (ref 3–12)
NEUTROS ABS: 6.3 10*3/uL (ref 1.7–7.7)
NEUTROS PCT: 61 % (ref 43–77)
Platelets: 238 10*3/uL (ref 150–400)
RBC: 4.99 MIL/uL (ref 3.87–5.11)
RDW: 20.3 % — ABNORMAL HIGH (ref 11.5–15.5)
WBC: 10.2 10*3/uL (ref 4.0–10.5)

## 2014-03-14 LAB — URINALYSIS, ROUTINE W REFLEX MICROSCOPIC
BILIRUBIN URINE: NEGATIVE
Glucose, UA: NEGATIVE mg/dL
Hgb urine dipstick: NEGATIVE
KETONES UR: NEGATIVE mg/dL
Leukocytes, UA: NEGATIVE
NITRITE: NEGATIVE
Protein, ur: 100 mg/dL — AB
Specific Gravity, Urine: 1.02 (ref 1.005–1.030)
Urobilinogen, UA: 0.2 mg/dL (ref 0.0–1.0)
pH: 6.5 (ref 5.0–8.0)

## 2014-03-14 LAB — CBG MONITORING, ED: Glucose-Capillary: 107 mg/dL — ABNORMAL HIGH (ref 70–99)

## 2014-03-14 LAB — URINE MICROSCOPIC-ADD ON

## 2014-03-14 MED ORDER — KETOROLAC TROMETHAMINE 30 MG/ML IJ SOLN
30.0000 mg | Freq: Once | INTRAMUSCULAR | Status: AC
  Administered 2014-03-14: 30 mg via INTRAVENOUS
  Filled 2014-03-14: qty 1

## 2014-03-14 MED ORDER — IBUPROFEN 800 MG PO TABS: 800.0000 mg | ORAL_TABLET | Freq: Three times a day (TID) | ORAL | Status: DC | PRN

## 2014-03-14 MED ORDER — ALBUTEROL SULFATE HFA 108 (90 BASE) MCG/ACT IN AERS: 2.0000 | INHALATION_SPRAY | RESPIRATORY_TRACT | Status: DC | PRN

## 2014-03-14 MED ORDER — HYDROCOD POLST-CHLORPHEN POLST 10-8 MG/5ML PO LQCR
5.0000 mL | Freq: Once | ORAL | Status: AC
  Administered 2014-03-14: 5 mL via ORAL
  Filled 2014-03-14: qty 5

## 2014-03-14 MED ORDER — GUAIFENESIN-CODEINE 100-10 MG/5ML PO SOLN: 5.0000 mL | ORAL | Status: DC | PRN

## 2014-03-14 MED ORDER — ONDANSETRON 4 MG PO TBDP: 4.0000 mg | ORAL_TABLET | Freq: Three times a day (TID) | ORAL | Status: DC | PRN

## 2014-03-14 MED ORDER — ONDANSETRON HCL 4 MG/2ML IJ SOLN
4.0000 mg | Freq: Once | INTRAMUSCULAR | Status: AC
  Administered 2014-03-14: 4 mg via INTRAVENOUS
  Filled 2014-03-14: qty 2

## 2014-03-14 MED ORDER — SODIUM CHLORIDE 0.9 % IV BOLUS (SEPSIS)
1000.0000 mL | Freq: Once | INTRAVENOUS | Status: AC
  Administered 2014-03-14: 1000 mL via INTRAVENOUS

## 2014-03-14 NOTE — ED Provider Notes (Signed)
TIME SEEN: This chart was scribed for [place], DO by Hilda Lias, ED Scribe. This patient was seen in room APA19/APA19 and the patient's care was started at 5:14 PM.   CHIEF COMPLAINT: Cough; emesis  HPI:  [name] is a 48 y.o. female with a hx of diabetes, HTN, and hyperlipidemia who presents to the Emergency Department complaining of a cough with associated nausea, vomiting, diarrhea, generalized body aches, and SOB that has been present for four days. Pt reports that she used an inhaler two hours ago with little relief. Pt states that she feels like she is dehydrated as well because of all the vomiting and diarrhea. Pt is a smoker. Pt denies travelling outside of the country recently, hx of DVT or PE, or hx of MI or other heart conditions.     ROS: See HPI Constitutional: no fever; generalized body aches; repeatedly coughing Eyes: no drainage  ENT: no runny nose   Cardiovascular:  no chest pain  Resp: SOB, cough (non-productive) GI: vomiting, nausea, and diarrhea GU: no dysuria Integumentary: no rash  Allergy: no hives  Musculoskeletal: no leg swelling  Neurological: no slurred speech ROS otherwise negative  PAST MEDICAL HISTORY/PAST SURGICAL HISTORY:  Past Medical History  Diagnosis Date  . Arteriosclerotic cardiovascular disease (ASCVD)     Minimal at cath in [facility].stress nuclear study in 8/08 with nl EF; neg stress echo in 2010  . Diabetes mellitus, type 2 2000    Onset in 2000; no insulin  . Hyperlipidemia   . Hypertension `    during treatment with Geodon  . Gastroesophageal reflux disease     Schatzki's ring  . Anemia, iron deficiency   . Alcohol abuse   . Depression   . Community acquired pneumonia 01/03/10, 05/2010, 04/2012    2011; with pleural effusion-[facility] acute resp failure; intubated in Jan 2014 (HMPV pneumonia)  . Obesity   . Schizoaffective disorder     requiring multiple psychiatric admissions  . Dysphagia   .  Diastolic dysfunction     grade 2 per echo 2011  . Pulmonary hypertension 05/02/2012    Patient needs repeat echo in 06/2012   . History of alcohol abuse 07/22/2007    Qualifier: Diagnosis of  By: Lenn Cal      MEDICATIONS:  Prior to Admission medications   Medication Sig Start Date End Date Taking? Authorizing Provider  acetaminophen (TYLENOL) 500 MG tablet Take 1,000 mg by mouth every 6 (six) hours as needed for mild pain.    Historical Provider, MD  albuterol (PROVENTIL HFA;VENTOLIN HFA) 108 (90 BASE) MCG/ACT inhaler Inhale 2 puffs into the lungs 3 (three) times daily as needed for wheezing or shortness of breath. 10/27/12   Rigoberto Noel, MD  Cetirizine HCl 10 MG CAPS Take 1 capsule (10 mg total) by mouth daily as needed. 02/22/14   Cresenciano Genre, MD  diclofenac sodium (VOLTAREN) 1 % GEL Apply 2 g topically 4 (four) times daily. 01/01/14   Tasrif Ahmed, MD  diclofenac sodium (VOLTAREN) 1 % GEL Apply 2 g topically 4 (four) times daily as needed (Pain).    Historical Provider, MD  esomeprazole (NEXIUM) 40 MG capsule Take 1 capsule (40 mg total) by mouth 2 (two) times daily before a meal. 02/10/14   Wilber Oliphant, MD  ferrous sulfate 325 (65 FE) MG tablet Take 1 tablet (325 mg total) by mouth 3 (three) times daily with meals. 12/10/13 12/10/14  Juluis Mire, MD  fluticasone (CUTIVATE) 0.05 % cream Apply topically 2 (two) times daily. 10/19/13   Billy Fischer, MD  hydrOXYzine (ATARAX/VISTARIL) 10 MG tablet Take 1 tablet (10 mg total) by mouth 2 (two) times daily as needed for anxiety. 12/07/13   Juluis Mire, MD  ibuprofen (ADVIL,MOTRIN) 200 MG tablet Take 400 mg by mouth every 6 (six) hours as needed for mild pain.    Historical Provider, MD  Insulin Glargine (LANTUS SOLOSTAR) 100 UNIT/ML Solostar Pen Inject 22 Units into the skin at bedtime. 12/07/13   Juluis Mire, MD  lisinopril (PRINIVIL,ZESTRIL) 40 MG tablet Take 1 tablet (40 mg total) by mouth daily. 02/22/14   Cresenciano Genre, MD   loratadine-pseudoephedrine (CLARITIN-D 24-HOUR) 10-240 MG per 24 hr tablet Take 1 tablet by mouth daily as needed for allergies.    Historical Provider, MD  lovastatin (MEVACOR) 20 MG tablet Take 1 tablet (20 mg total) by mouth at bedtime. 09/01/13   Neema Bobbie Stack, MD  metFORMIN (GLUCOPHAGE) 1000 MG tablet Take 1,000 mg by mouth 2 (two) times daily with a meal.    Historical Provider, MD  metoprolol (LOPRESSOR) 100 MG tablet Take 1 tablet (100 mg total) by mouth 2 (two) times daily. 02/22/14   Cresenciano Genre, MD  mometasone (NASONEX) 50 MCG/ACT nasal spray Place 2 sprays into the nose 2 (two) times daily. 02/22/14   Cresenciano Genre, MD  ondansetron (ZOFRAN) 4 MG tablet Take 4 mg by mouth every 8 (eight) hours as needed for nausea or vomiting.    Historical Provider, MD  potassium chloride (K-DUR) 10 MEQ tablet Take 1 tablet (10 mEq total) by mouth daily. 12/24/13   Juluis Mire, MD  sucralfate (CARAFATE) 1 G tablet Take 1 g by mouth 4 (four) times daily -  with meals and at bedtime.    Historical Provider, MD  ziprasidone (GEODON) 80 MG capsule Take 1 capsule (80 mg total) by mouth 2 (two) times daily with a meal. 03/26/12   Waylan Boga, NP    ALLERGIES:  Allergies  Allergen Reactions  . Cephalexin     Trouble breathing, felt like she had bumps in her throat.  . Metronidazole Shortness Of Breath and Swelling  . Orange Itching  . Shrimp [Shellfish Allergy] Shortness Of Breath and Itching    Takes Benadryl before eating shrimp.  Marland Kitchen Penicillins Hives and Swelling    Fever as well  . Sulfonamide Derivatives Hives    fever  . Glipizide Other (See Comments)    psychosis  . Sulfamethoxazole-Trimethoprim Rash    SOCIAL HISTORY:  History  Substance Use Topics  . Smoking status: Current Every Day Smoker -- 0.80 packs/day for 30 years    Types: Cigarettes    Start date: 05/18/2012  . Smokeless tobacco: Never Used     Comment: cutting back,  . Alcohol Use: No     Comment: hx of ETOH abuse     FAMILY HISTORY: Family History  Problem Relation Age of Onset  . Colon cancer Other   . Hypertension Mother   . Stroke Father     deceased at age 26  . Heart disease Sister   . Anesthesia problems Neg Hx   . Hypotension Neg Hx   . Malignant hyperthermia Neg Hx   . Pseudochol deficiency Neg Hx     EXAM: BP 191/91 mmHg  Pulse 85  Temp(Src) 99.2 F (37.3 C) (Oral)  Resp 18  Ht 5\' 4"  (1.626 m)  Wt 213 lb (96.616 kg)  BMI 36.54 kg/m2  SpO2 100%  LMP 02/21/2014 CONSTITUTIONAL: Alert and oriented and responds appropriately to questions. Well-appearing; well-nourished HEAD: Normocephalic EYES: Conjunctivae clear, PERRL ENT: normal nose; no rhinorrhea; moist mucous membranes; pharynx without lesions noted NECK: Supple, no meningismus, no LAD  CARD: RRR; S1 and S2 appreciated; no murmurs, no clicks, no rubs, no gallops RESP: Normal chest excursion without splinting or tachypnea; breath sounds clear and equal bilaterally; no wheezes, no rhonchi, no rales, no hypoxia or respiratory distress, speaking full sentences ABD/GI: Normal bowel sounds; non-distended; soft, non-tender, no rebound, no guarding BACK:  The back appears normal and is non-tender to palpation, there is no CVA tenderness EXT: Normal ROM in all joints; non-tender to palpation; no edema; normal capillary refill; no cyanosis    SKIN: Normal color for age and race; warm NEURO: Moves all extremities equally PSYCH: The patient's mood and manner are appropriate. Grooming and personal hygiene are appropriate.  MEDICAL DECISION MAKING: Pt here with symptoms of viral illness. She is mildly hypertensive but otherwise hemodynamically stable. Lungs are clear. Chest x-ray performed in triage is unremarkable without edema, infiltrate. Patient states she's concerned she could have bronchitis and feel she needs azithromycin. Discussed with patient given her multiple symptoms I suspect this is more likely viral in nature and could  possibly be influenza. She is outside treatment window for Tamiflu. Discussed with patient I do not feel antibiotics would help for her symptoms at this time. We'll treat symptomatically with Toradol, Zofran, Tussionex, IV fluids. Labs pending.  ED PROGRESS: Patient's labs and urine are unremarkable. Vital signs improved. I feel she is safe to be discharged home. We'll discharge with prescription for albuterol inhaler, guaifenesin with codeine, ibuprofen, Zofran. Have discussed return precautions and supportive care instructions. She verbalized understanding and is comfortable with plan.    EKG Interpretation  Date/Time:  Sunday March 14 2014 17:42:26 EST Ventricular Rate:  88 PR Interval:  114 QRS Duration: 73 QT Interval:  385 QTC Calculation: 466 R Axis:   67 Text Interpretation:  Sinus rhythm Borderline short PR interval Anteroseptal infarct, old Baseline wander in lead(s) V6 No significant change since last tracing Confirmed by Lilana Blasko,  DO, Denaisha Swango [phone]) on 03/14/2014 5:54:36 PM        I personally performed the services described in this documentation, which was scribed in my presence. The recorded information has been reviewed and is accurate.     [place], DO 03/14/14 1951

## 2014-03-14 NOTE — ED Notes (Signed)
Pt alert & oriented x4, stable gait. Patient given discharge instructions, paperwork & prescription(s). Patient  instructed to stop at the registration desk to finish any additional paperwork. Patient verbalized understanding. Pt left department w/ no further questions. 

## 2014-03-14 NOTE — ED Notes (Signed)
Per patient nausea, vomiting, diarrhea, and cough (nonproductive). Patient reports shortness of breath with cough. Patient reports using inhaler 2 hours ago with little relief. Unsure of any fevers. Denies any urinary symptoms.

## 2014-03-14 NOTE — Discharge Instructions (Signed)
Nausea and Vomiting Nausea is a sick feeling that often comes before throwing up (vomiting). Vomiting is a reflex where stomach contents come out of your mouth. Vomiting can cause severe loss of body fluids (dehydration). Children and elderly adults can become dehydrated quickly, especially if they also have diarrhea. Nausea and vomiting are symptoms of a condition or disease. It is important to find the cause of your symptoms. CAUSES   Direct irritation of the stomach lining. This irritation can result from increased acid production (gastroesophageal reflux disease), infection, food poisoning, taking certain medicines (such as nonsteroidal anti-inflammatory drugs), alcohol use, or tobacco use.  Signals from the brain.These signals could be caused by a headache, heat exposure, an inner ear disturbance, increased pressure in the brain from injury, infection, a tumor, or a concussion, pain, emotional stimulus, or metabolic problems.  An obstruction in the gastrointestinal tract (bowel obstruction).  Illnesses such as diabetes, hepatitis, gallbladder problems, appendicitis, kidney problems, cancer, sepsis, atypical symptoms of a heart attack, or eating disorders.  Medical treatments such as chemotherapy and radiation.  Receiving medicine that makes you sleep (general anesthetic) during surgery. DIAGNOSIS Your caregiver may ask for tests to be done if the problems do not improve after a few days. Tests may also be done if symptoms are severe or if the reason for the nausea and vomiting is not clear. Tests may include:  Urine tests.  Blood tests.  Stool tests.  Cultures (to look for evidence of infection).  X-rays or other imaging studies. Test results can help your caregiver make decisions about treatment or the need for additional tests. TREATMENT You need to stay well hydrated. Drink frequently but in small amounts.You may wish to drink water, sports drinks, clear broth, or eat frozen  ice pops or gelatin dessert to help stay hydrated.When you eat, eating slowly may help prevent nausea.There are also some antinausea medicines that may help prevent nausea. HOME CARE INSTRUCTIONS   Take all medicine as directed by your caregiver.  If you do not have an appetite, do not force yourself to eat. However, you must continue to drink fluids.  If you have an appetite, eat a normal diet unless your caregiver tells you differently.  Eat a variety of complex carbohydrates (rice, wheat, potatoes, bread), lean meats, yogurt, fruits, and vegetables.  Avoid high-fat foods because they are more difficult to digest.  Drink enough water and fluids to keep your urine clear or pale yellow.  If you are dehydrated, ask your caregiver for specific rehydration instructions. Signs of dehydration may include:  Severe thirst.  Dry lips and mouth.  Dizziness.  Dark urine.  Decreasing urine frequency and amount.  Confusion.  Rapid breathing or pulse. SEEK IMMEDIATE MEDICAL CARE IF:   You have blood or brown flecks (like coffee grounds) in your vomit.  You have black or bloody stools.  You have a severe headache or stiff neck.  You are confused.  You have severe abdominal pain.  You have chest pain or trouble breathing.  You do not urinate at least once every 8 hours.  You develop cold or clammy skin.  You continue to vomit for longer than 24 to 48 hours.  You have a fever. MAKE SURE YOU:   Understand these instructions.  Will watch your condition.  Will get help right away if you are not doing well or get worse. Document Released: 04/02/2005 Document Revised: 06/25/2011 Document Reviewed: 08/30/2010 [employer] Patient Information 2015 [employer]. This information is not intended   to replace advice given to you by your health care provider. Make sure you discuss any questions you have with your health care provider.  Diarrhea Diarrhea is frequent loose and watery  bowel movements. It can cause you to feel weak and dehydrated. Dehydration can cause you to become tired and thirsty, have a dry mouth, and have decreased urination that often is dark yellow. Diarrhea is a sign of another problem, most often an infection that will not last long. In most cases, diarrhea typically lasts 2-3 days. However, it can last longer if it is a sign of something more serious. It is important to treat your diarrhea as directed by your caregiver to lessen or prevent future episodes of diarrhea. CAUSES  Some common causes include:  Gastrointestinal infections caused by viruses, bacteria, or parasites.  Food poisoning or food allergies.  Certain medicines, such as antibiotics, chemotherapy, and laxatives.  Artificial sweeteners and fructose.  Digestive disorders. HOME CARE INSTRUCTIONS  Ensure adequate fluid intake (hydration): Have 1 cup (8 oz) of fluid for each diarrhea episode. Avoid fluids that contain simple sugars or sports drinks, fruit juices, whole milk products, and sodas. Your urine should be clear or pale yellow if you are drinking enough fluids. Hydrate with an oral rehydration solution that you can purchase at pharmacies, retail stores, and online. You can prepare an oral rehydration solution at home by mixing the following ingredients together:   - tsp table salt.   tsp baking soda.   tsp salt substitute containing potassium chloride.  1  tablespoons sugar.  1 L (34 oz) of water.  Certain foods and beverages may increase the speed at which food moves through the gastrointestinal (GI) tract. These foods and beverages should be avoided and include:  Caffeinated and alcoholic beverages.  High-fiber foods, such as raw fruits and vegetables, nuts, seeds, and whole grain breads and cereals.  Foods and beverages sweetened with sugar alcohols, such as xylitol, sorbitol, and mannitol.  Some foods may be well tolerated and may help thicken stool  including:  Starchy foods, such as rice, toast, pasta, low-sugar cereal, oatmeal, grits, baked potatoes, crackers, and bagels.  Bananas.  Applesauce.  Add probiotic-rich foods to help increase healthy bacteria in the GI tract, such as yogurt and fermented milk products.  Wash your hands well after each diarrhea episode.  Only take over-the-counter or prescription medicines as directed by your caregiver.  Take a warm bath to relieve any burning or pain from frequent diarrhea episodes. SEEK IMMEDIATE MEDICAL CARE IF:   You are unable to keep fluids down.  You have persistent vomiting.  You have blood in your stool, or your stools are black and tarry.  You do not urinate in 6-8 hours, or there is only a small amount of very dark urine.  You have abdominal pain that increases or localizes.  You have weakness, dizziness, confusion, or light-headedness.  You have a severe headache.  Your diarrhea gets worse or does not get better.  You have a fever or persistent symptoms for more than 2-3 days.  You have a fever and your symptoms suddenly get worse. MAKE SURE YOU:   Understand these instructions.  Will watch your condition.  Will get help right away if you are not doing well or get worse. Document Released: 03/23/2002 Document Revised: 08/17/2013 Document Reviewed: 12/09/2011 [facility] Patient Information 2015 [place], [place]. This information is not intended to replace advice given to you by your health care provider. Make sure you discuss  any questions you have with your health care provider.  Viral Infections A viral infection can be caused by different types of viruses.Most viral infections are not serious and resolve on their own. However, some infections may cause severe symptoms and may lead to further complications. SYMPTOMS Viruses can frequently cause:  Minor sore throat.  Aches and pains.  Headaches.  Runny nose.  Different types of rashes.  Watery  eyes.  Tiredness.  Cough.  Loss of appetite.  Gastrointestinal infections, resulting in nausea, vomiting, and diarrhea. These symptoms do not respond to antibiotics because the infection is not caused by bacteria. However, you might catch a bacterial infection following the viral infection. This is sometimes called a "superinfection." Symptoms of such a bacterial infection may include:  Worsening sore throat with pus and difficulty swallowing.  Swollen neck glands.  Chills and a high or persistent fever.  Severe headache.  Tenderness over the sinuses.  Persistent overall ill feeling (malaise), muscle aches, and tiredness (fatigue).  Persistent cough.  Yellow, green, or brown mucus production with coughing. HOME CARE INSTRUCTIONS   Only take over-the-counter or prescription medicines for pain, discomfort, diarrhea, or fever as directed by your caregiver.  Drink enough water and fluids to keep your urine clear or pale yellow. Sports drinks can provide valuable electrolytes, sugars, and hydration.  Get plenty of rest and maintain proper nutrition. Soups and broths with crackers or rice are fine. SEEK IMMEDIATE MEDICAL CARE IF:   You have severe headaches, shortness of breath, chest pain, neck pain, or an unusual rash.  You have uncontrolled vomiting, diarrhea, or you are unable to keep down fluids.  You or your child has an oral temperature above 102 F (38.9 C), not controlled by medicine.  Your baby is older than 3 months with a rectal temperature of 102 F (38.9 C) or higher.  Your baby is 64 months old or younger with a rectal temperature of 100.4 F (38 C) or higher. MAKE SURE YOU:   Understand these instructions.  Will watch your condition.  Will get help right away if you are not doing well or get worse. Document Released: 01/10/2005 Document Revised: 06/25/2011 Document Reviewed: 08/07/2010 [facility] Patient Information 2015 [place], [place]. This information  is not intended to replace advice given to you by your health care provider. Make sure you discuss any questions you have with your health care provider.

## 2014-03-15 ENCOUNTER — Telehealth: Payer: Self-pay | Admitting: *Deleted

## 2014-03-15 ENCOUNTER — Ambulatory Visit: Payer: Self-pay | Admitting: Pulmonary Disease

## 2014-03-15 NOTE — Telephone Encounter (Signed)
Pt called wants meds for wheezing and nebulizer. Pt has  Congestion. Went to ER recently. Offered appt 03/16/14 - prefers to wait till 03/17/14 3:45PM Dr Eulas Post. Suggest to call if any change. Had appt 03/15/14 in clinic - canceled. Hilda Blades Orean Giarratano RN 03/15/14 4:30PM

## 2014-03-17 ENCOUNTER — Ambulatory Visit (INDEPENDENT_AMBULATORY_CARE_PROVIDER_SITE_OTHER): Payer: Medicare Other | Admitting: Internal Medicine

## 2014-03-17 ENCOUNTER — Ambulatory Visit: Payer: Self-pay | Admitting: Internal Medicine

## 2014-03-17 ENCOUNTER — Encounter: Payer: Self-pay | Admitting: Internal Medicine

## 2014-03-17 VITALS — BP 174/88 | HR 92 | Temp 98.4°F | Ht 64.0 in | Wt 214.0 lb

## 2014-03-17 DIAGNOSIS — J988 Other specified respiratory disorders: Principal | ICD-10-CM

## 2014-03-17 DIAGNOSIS — J302 Other seasonal allergic rhinitis: Secondary | ICD-10-CM

## 2014-03-17 DIAGNOSIS — I10 Essential (primary) hypertension: Secondary | ICD-10-CM

## 2014-03-17 DIAGNOSIS — Z720 Tobacco use: Secondary | ICD-10-CM

## 2014-03-17 DIAGNOSIS — J449 Chronic obstructive pulmonary disease, unspecified: Secondary | ICD-10-CM

## 2014-03-17 DIAGNOSIS — E119 Type 2 diabetes mellitus without complications: Secondary | ICD-10-CM

## 2014-03-17 DIAGNOSIS — J069 Acute upper respiratory infection, unspecified: Secondary | ICD-10-CM

## 2014-03-17 DIAGNOSIS — B9789 Other viral agents as the cause of diseases classified elsewhere: Secondary | ICD-10-CM

## 2014-03-17 DIAGNOSIS — F17200 Nicotine dependence, unspecified, uncomplicated: Secondary | ICD-10-CM

## 2014-03-17 MED ORDER — ALBUTEROL SULFATE (2.5 MG/3ML) 0.083% IN NEBU: 2.5000 mg | INHALATION_SOLUTION | Freq: Four times a day (QID) | RESPIRATORY_TRACT | Status: DC | PRN

## 2014-03-17 MED ORDER — ALBUTEROL SULFATE (2.5 MG/3ML) 0.083% IN NEBU
2.5000 mg | INHALATION_SOLUTION | Freq: Once | RESPIRATORY_TRACT | Status: AC
  Administered 2014-03-17: 2.5 mg via RESPIRATORY_TRACT

## 2014-03-17 MED ORDER — IPRATROPIUM BROMIDE 0.02 % IN SOLN
0.5000 mg | Freq: Once | RESPIRATORY_TRACT | Status: AC
  Administered 2014-03-17: 0.5 mg via RESPIRATORY_TRACT

## 2014-03-17 NOTE — Progress Notes (Signed)
Patient ID: [name], female   DOB: 1965-04-18, 48 y.o.   MRN: [id number]  Subjective:   Patient ID: [name] female   DOB: 12/21/1965 48 y.o.   MRN: [id number]  HPI: [name] is a 48 y.o. F w/ PMH DM2, HLD, HTN, diastolic heart failure, pulmonary hypertension, current smoker, and COPD presents for an ED f/u.  She was seen in the ED 11/19 c/o cough and SOB, associated with nausea, vomiting, diarrhea, and body aches x4 days. CXR was w/o acute process. Exam was normal. She was thought to have a viral illness and was treated with Toradol, Zofran, Tussionex, IV fluids and was discharged home on an albuterol inhaler, guaifenesin with codeine, ibuprofen, and Zofran. She called the clinic the next day requesting a prescription for nebulizer meds 2/2 wheezing.  Today she is still wheezing, SOB, and dry cough. She is using her albuterol inh 4x day w/o much relief. Her wheezing and cough are worse at night. She is still smoking about 10 cig/day. She does endorse a low grade fever of 99.0.   Past Medical History  Diagnosis Date  . Arteriosclerotic cardiovascular disease (ASCVD)     Minimal at cath in [facility].stress nuclear study in 8/08 with nl EF; neg stress echo in 2010  . Diabetes mellitus, type 2 2000    Onset in 2000; no insulin  . Hyperlipidemia   . Hypertension `    during treatment with Geodon  . Gastroesophageal reflux disease     Schatzki's ring  . Anemia, iron deficiency   . Alcohol abuse   . Depression   . Community acquired pneumonia 01/03/10, 05/2010, 04/2012    2011; with pleural effusion-[facility] acute resp failure; intubated in Jan 2014 (HMPV pneumonia)  . Obesity   . Schizoaffective disorder     requiring multiple psychiatric admissions  . Dysphagia   . Diastolic dysfunction     grade 2 per echo 2011  . Pulmonary hypertension 05/02/2012    Patient needs repeat echo in 06/2012   . History of alcohol abuse 07/22/2007    Qualifier:  Diagnosis of  By: Lenn Cal     Current Outpatient Prescriptions  Medication Sig Dispense Refill  . Cetirizine HCl 10 MG CAPS Take 1 capsule (10 mg total) by mouth daily as needed. 30 capsule 1  . esomeprazole (NEXIUM) 40 MG capsule Take 1 capsule (40 mg total) by mouth 2 (two) times daily before a meal. 60 capsule 2  . ferrous sulfate 325 (65 FE) MG tablet Take 1 tablet (325 mg total) by mouth 3 (three) times daily with meals. 90 tablet 3  . guaiFENesin-codeine 100-10 MG/5ML syrup Take 5 mLs by mouth every 4 (four) hours as needed for cough. 120 mL 0  . hydrOXYzine (ATARAX/VISTARIL) 10 MG tablet Take 1 tablet (10 mg total) by mouth 2 (two) times daily as needed for anxiety. 30 tablet 3  . ibuprofen (ADVIL,MOTRIN) 800 MG tablet Take 1 tablet (800 mg total) by mouth every 8 (eight) hours as needed for mild pain. 30 tablet 0  . Insulin Glargine (LANTUS SOLOSTAR) 100 UNIT/ML Solostar Pen Inject 22 Units into the skin at bedtime. 15 mL 12  . lisinopril (PRINIVIL,ZESTRIL) 40 MG tablet Take 1 tablet (40 mg total) by mouth daily. 30 tablet 2  . lovastatin (MEVACOR) 20 MG tablet Take 1 tablet (20 mg total) by mouth at bedtime. 90 tablet 3  . metFORMIN (GLUCOPHAGE) 1000 MG tablet Take 1,000 mg by  mouth 2 (two) times daily with a meal.    . metoprolol (LOPRESSOR) 100 MG tablet Take 1 tablet (100 mg total) by mouth 2 (two) times daily. 60 tablet 1  . ondansetron (ZOFRAN ODT) 4 MG disintegrating tablet Take 1 tablet (4 mg total) by mouth every 8 (eight) hours as needed for nausea or vomiting. 20 tablet 0  . potassium chloride (K-DUR) 10 MEQ tablet Take 1 tablet (10 mEq total) by mouth daily. 30 tablet 3  . ziprasidone (GEODON) 80 MG capsule Take 1 capsule (80 mg total) by mouth 2 (two) times daily with a meal. 60 capsule 0  . acetaminophen (TYLENOL) 500 MG tablet Take 1,000 mg by mouth every 6 (six) hours as needed for mild pain.    Marland Kitchen albuterol (PROVENTIL HFA;VENTOLIN HFA) 108 (90 BASE) MCG/ACT inhaler  Inhale 2 puffs into the lungs 3 (three) times daily as needed for wheezing or shortness of breath. (Patient not taking: Reported on 03/17/2014) 1 Inhaler 2  . albuterol (PROVENTIL) (2.5 MG/3ML) 0.083% nebulizer solution Take 3 mLs (2.5 mg total) by nebulization every 6 (six) hours as needed for wheezing or shortness of breath. 75 mL 12  . diclofenac sodium (VOLTAREN) 1 % GEL Apply 2 g topically 4 (four) times daily. (Patient not taking: Reported on 03/17/2014) 1 Tube 1  . diclofenac sodium (VOLTAREN) 1 % GEL Apply 2 g topically 4 (four) times daily as needed (Pain).    Marland Kitchen ibuprofen (ADVIL,MOTRIN) 200 MG tablet Take 400 mg by mouth every 6 (six) hours as needed for mild pain.    . mometasone (NASONEX) 50 MCG/ACT nasal spray Place 2 sprays into the nose 2 (two) times daily. (Patient not taking: Reported on 03/17/2014) 17 g 12  . sucralfate (CARAFATE) 1 G tablet Take 1 g by mouth 4 (four) times daily -  with meals and at bedtime.     No current facility-administered medications for this visit.   Facility-Administered Medications Ordered in Other Visits  Medication Dose Route Frequency Provider Last Rate Last Dose  . 0.9 %  sodium chloride infusion   Intravenous Once Merryl Hacker, MD      . ondansetron [facility]) injection 4 mg  4 mg Intramuscular Once Merryl Hacker, MD      . pantoprazole (PROTONIX) injection 40 mg  40 mg Intravenous Once Merryl Hacker, MD       Family History  Problem Relation Age of Onset  . Colon cancer Other   . Hypertension Mother   . Stroke Father     deceased at age 29  . Heart disease Sister   . Anesthesia problems Neg Hx   . Hypotension Neg Hx   . Malignant hyperthermia Neg Hx   . Pseudochol deficiency Neg Hx    History   Social History  . Marital Status: Single    Spouse Name: N/A    Number of Children: N/A  . Years of Education: 12   Occupational History  . Disability   . UNEMPLOYED    Social History Main Topics  . Smoking status: Current Every  Day Smoker -- 0.75 packs/day for 30 years    Types: Cigarettes    Start date: 05/18/2012  . Smokeless tobacco: Never Used     Comment: cutting back,  . Alcohol Use: No     Comment: hx of ETOH abuse  . Drug Use: No  . Sexual Activity: None   Other Topics Concern  . 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 HEENT: Denies congestion or rhinorrhea Respiratory: +SOB, DOE, cough, and wheezing.   Cardiovascular: Denies chest pain, Gastrointestinal: Denies current nausea, vomiting, abdominal pain, diarrhea, constipation Genitourinary: Denies dysuria Musculoskeletal: Denies myalgias, Skin: Denies rash and wound.  Neurological: Denies syncope, weakness.  Psychiatric/Behavioral: Denies suicidal ideation, mood changes, confusion.  Objective:  Physical Exam: Filed Vitals:   03/17/14 1344  BP: 174/88  Pulse: 92  Temp: 98.4 F (36.9 C)  TempSrc: Oral  Height: 5\' 4"  (1.626 m)  Weight: 214 lb (97.07 kg)  SpO2: 100%   Constitutional: Vital signs reviewed.  Patient is a well-developed and well-nourished female in no acute distress and cooperative with exam. Alert and oriented x3.  Head: Normocephalic and atraumatic Eyes: PERRL, EOMI. No scleral icterus.  Cardiovascular: RRR, no MRG Pulmonary/Chest: Normal respiratory effort, CTAB, no wheezes, rales, or rhonchi Abdominal: Soft. Non-tender, non-distended, bowel sounds are normal Musculoskeletal: No joint deformities. Moves all 4 extremities  Neurological: A&O x3, cranial nerve II-XII are grossly intact, no focal motor deficit  Skin: Warm, dry and intact.  Psychiatric: Normal mood and affect. speech and behavior is normal.   Assessment &  Plan:   Please refer to Problem List based Assessment and Plan

## 2014-03-17 NOTE — Assessment & Plan Note (Signed)
BP elevated today to 174/88, but this is while she is actively coughing. Suspect increased 2/2 cough. She is to return in 2 weeks for BP recheck once she is feeling better. Continuing current medications.

## 2014-03-17 NOTE — Assessment & Plan Note (Addendum)
Pt with low grade temperature at home noticed when she is due for a dose of ibuprofen. She is afebrile in the clinic. She states that she has been wheezing in addition to having a dry cough, but on exam she is clear to ascultation, O2 saturation is 100% on room air, and she is in no distress. She continues to smoke, which is likely making her respiratory symptoms worse. Giving a Duoneb treatment here in clinic. Discussed with the patient that this cough could persist for a few weeks even after she is feeling better.  - She is to continue the cough syrup provided in the ED - She is to continue her Zyrtec and Nasonex - Ordering nebulizer machine and albuterol/atrovent meds. - Smoking cessation was discussed with the patient, she is contemplating cessation - She is to call the clinic if she develops a fever or if her breathing worsens

## 2014-03-17 NOTE — Assessment & Plan Note (Signed)
Pt still smoking despite respiratory illness. Counseled on cessation.

## 2014-03-17 NOTE — Patient Instructions (Addendum)
Continue the medications given to you in the Emergency Room. You can use the nebulizer every 4-6 hours as needed for shortness of breath, wheezing, and for your cough. Do not use this and your inhaler at the same time.  If your breathing worsens or if you develop a temperature greater than 100.5, please call the clinic  Return to the clinic in approximately 2 weeks and bring your meter. We will reassess your blood pressure and blood sugar control then.   General Instructions:   Please bring your medicines with you each time you come to clinic.  Medicines may include prescription medications, over-the-counter medications, herbal remedies, eye drops, vitamins, or other pills.   Progress Toward Treatment Goals:  Treatment Goal 02/22/2014  Hemoglobin A1C at goal  Blood pressure deteriorated  Stop smoking smoking the same amount    Self Care Goals & Plans:  Self Care Goal 02/22/2014  Manage my medications take my medicines as prescribed; bring my medications to every visit; refill my medications on time; follow the sick day instructions if I am sick  Monitor my health keep track of my blood glucose; bring my glucose meter and log to each visit; keep track of my blood pressure; bring my blood pressure log to each visit; keep track of my weight; check my feet daily  Eat healthy foods drink diet soda or water instead of juice or soda; eat more vegetables; eat foods that are low in salt; eat baked foods instead of fried foods; eat fruit for snacks and desserts; eat smaller portions  Be physically active find an activity I enjoy  Stop smoking call [facility] (1-800-QUIT-NOW)  Meeting treatment goals maintain the current self-care plan    Home Blood Glucose Monitoring 02/22/2014  Check my blood sugar 3 times a day  When to check my blood sugar before meals     Care Management & Community Referrals:  Referral 02/22/2014  Referrals made for care management support none needed  Referrals made  to community resources none

## 2014-03-18 ENCOUNTER — Ambulatory Visit (HOSPITAL_COMMUNITY): Payer: Medicare Other

## 2014-03-18 DIAGNOSIS — F319 Bipolar disorder, unspecified: Secondary | ICD-10-CM | POA: Diagnosis not present

## 2014-03-21 NOTE — Addendum Note (Signed)
Addended by: Oval Linsey D on: 03/21/2014 09:36 AM   Modules accepted: Orders, Medications, Level of Service

## 2014-03-21 NOTE — Progress Notes (Signed)
Case discussed with Dr. Eulas Post soon after the resident saw the patient.  We reviewed the resident's history and exam and pertinent patient test results.  I agree with the assessment, diagnosis, and plan of care documented in the resident's note.  Primary Care Exception code (GE) was added after the appointment to correct the billing.  I deleted the duplicate Voltaren gel prescription on the medication list but was unable to delete the duplicate ibuprofen prescription as there were 2 different doses.  Will ask Dr. Eulas Post to clarify and correct or Dr. Hulen Luster to do so at the return visit in 2 weeks to make sure we have an accurate and updated medication list in the electronic medical record.

## 2014-03-24 ENCOUNTER — Other Ambulatory Visit: Payer: Self-pay | Admitting: Gastroenterology

## 2014-03-24 ENCOUNTER — Telehealth: Payer: Self-pay | Admitting: Internal Medicine

## 2014-03-24 ENCOUNTER — Ambulatory Visit (HOSPITAL_COMMUNITY)
Admission: RE | Admit: 2014-03-24 | Discharge: 2014-03-24 | Disposition: A | Discharge status: Home / Self Care | Payer: Medicare Other | Source: Ambulatory Visit | Attending: Gastroenterology | Admitting: Gastroenterology

## 2014-03-24 ENCOUNTER — Encounter (HOSPITAL_COMMUNITY): Payer: Self-pay

## 2014-03-24 DIAGNOSIS — R110 Nausea: Secondary | ICD-10-CM

## 2014-03-24 DIAGNOSIS — R109 Unspecified abdominal pain: Secondary | ICD-10-CM | POA: Diagnosis not present

## 2014-03-24 MED ORDER — SODIUM CHLORIDE 0.9 % IJ SOLN
INTRAMUSCULAR | Status: AC
Start: 2014-03-24 — End: 2014-03-24
  Filled 2014-03-24: qty 30

## 2014-03-24 MED ORDER — SINCALIDE 5 MCG IJ SOLR
INTRAMUSCULAR | Status: AC
  Administered 2014-03-24: 1.93 ug via INTRAVENOUS
  Filled 2014-03-24: qty 5

## 2014-03-24 MED ORDER — TECHNETIUM TC 99M MEBROFENIN IV KIT
5.0000 | PACK | Freq: Once | INTRAVENOUS | Status: AC | PRN
  Administered 2014-03-24: 5 via INTRAVENOUS

## 2014-03-24 MED ORDER — STERILE WATER FOR INJECTION IJ SOLN
INTRAMUSCULAR | Status: AC
  Administered 2014-03-24: 1.93 mL via INTRAVENOUS
  Filled 2014-03-24: qty 10

## 2014-03-24 NOTE — Telephone Encounter (Signed)
Tried to call pt- NA 

## 2014-03-24 NOTE — Telephone Encounter (Signed)
Pt called this afternoon saying that she had her test in [facility] today and was told that we would have her results this afternoon.  I told her that it was too soon for [name] to have her results and it may take up to 7-10 business days. She also said that she was staying dizzy and having nausea and she said it is starting to effect her body and wanted to know what she could do. I told her that the nurse was at lunch and I would send a message to her to see what recommendations she or RMR would have. Please call [phone]

## 2014-03-25 ENCOUNTER — Encounter: Payer: Self-pay | Admitting: Licensed Clinical Social Worker

## 2014-03-25 NOTE — Progress Notes (Signed)
Letter received pt has established care with [facility]: [phone] Phone

## 2014-03-26 NOTE — Telephone Encounter (Signed)
Tried to call pt- NA 

## 2014-03-30 NOTE — Telephone Encounter (Signed)
I talked to the pt. I informed her that her test was normal. She said she did have some pain and nausea while they were doing the test.   She said she has been having problems with her sinuses and her pcp recently put her back on her zyrtec for allergies. She said while she was taking it, she felt much better and was doing well. The zyrtec makes her very sleepy and she cant stay awake so she stopped taking it and now she feels bad again. She said she called her pcp yesterday about changing to a different allergy medication but she has not heard back from them yet.

## 2014-03-30 NOTE — Telephone Encounter (Signed)
Consider taking Zyrtec at bedtime if she is not already doing that; otherwise, she can wait to hear about switching med per PCP. Recommend ov in 05/2014 with Dr. Gala Romney for follow-up. Have future labs prior to that visit.

## 2014-03-31 NOTE — Progress Notes (Signed)
Quick Note:  Patient aware. ______ 

## 2014-04-02 NOTE — Telephone Encounter (Signed)
Pt is aware. Please schedule ov.  

## 2014-04-05 ENCOUNTER — Encounter: Payer: Self-pay | Admitting: Internal Medicine

## 2014-04-05 NOTE — Telephone Encounter (Signed)
APPOINTMENT MADE AND LETTER SENT °

## 2014-04-13 ENCOUNTER — Other Ambulatory Visit: Payer: Self-pay

## 2014-04-13 DIAGNOSIS — D509 Iron deficiency anemia, unspecified: Secondary | ICD-10-CM

## 2014-04-27 ENCOUNTER — Other Ambulatory Visit: Payer: Self-pay | Admitting: Internal Medicine

## 2014-04-30 DIAGNOSIS — D509 Iron deficiency anemia, unspecified: Secondary | ICD-10-CM | POA: Diagnosis not present

## 2014-05-01 LAB — FERRITIN: Ferritin: 13 ng/mL (ref 10–291)

## 2014-05-01 LAB — CBC WITH DIFFERENTIAL/PLATELET
Basophils Absolute: 0 10*3/uL (ref 0.0–0.1)
Basophils Relative: 0 % (ref 0–1)
Eosinophils Absolute: 0.2 10*3/uL (ref 0.0–0.7)
Eosinophils Relative: 2 % (ref 0–5)
HCT: 37.8 % (ref 36.0–46.0)
Hemoglobin: 12 g/dL (ref 12.0–15.0)
LYMPHS PCT: 38 % (ref 12–46)
Lymphs Abs: 3.1 10*3/uL (ref 0.7–4.0)
MCH: 25.2 pg — ABNORMAL LOW (ref 26.0–34.0)
MCHC: 31.7 g/dL (ref 30.0–36.0)
MCV: 79.4 fL (ref 78.0–100.0)
MONO ABS: 0.5 10*3/uL (ref 0.1–1.0)
MPV: 10.7 fL (ref 8.6–12.4)
Monocytes Relative: 6 % (ref 3–12)
Neutro Abs: 4.4 10*3/uL (ref 1.7–7.7)
Neutrophils Relative %: 54 % (ref 43–77)
Platelets: 241 10*3/uL (ref 150–400)
RBC: 4.76 MIL/uL (ref 3.87–5.11)
RDW: 16.9 % — AB (ref 11.5–15.5)
WBC: 8.1 10*3/uL (ref 4.0–10.5)

## 2014-05-01 LAB — IRON AND TIBC
%SAT: 6 % — AB (ref 20–55)
Iron: 23 ug/dL — ABNORMAL LOW (ref 42–145)
TIBC: 354 ug/dL (ref 250–470)
UIBC: 331 ug/dL (ref 125–400)

## 2014-05-04 ENCOUNTER — Emergency Department (HOSPITAL_COMMUNITY)
Admission: EM | Admit: 2014-05-04 | Discharge: 2014-05-05 | Disposition: A | Discharge status: Home / Self Care | Payer: Medicare Other | Attending: Emergency Medicine | Admitting: Emergency Medicine

## 2014-05-04 ENCOUNTER — Telehealth: Payer: Self-pay | Admitting: *Deleted

## 2014-05-04 ENCOUNTER — Ambulatory Visit: Payer: Self-pay | Admitting: Internal Medicine

## 2014-05-04 ENCOUNTER — Other Ambulatory Visit: Payer: Self-pay | Admitting: Internal Medicine

## 2014-05-04 ENCOUNTER — Encounter (HOSPITAL_COMMUNITY): Payer: Self-pay | Admitting: Emergency Medicine

## 2014-05-04 DIAGNOSIS — I519 Heart disease, unspecified: Secondary | ICD-10-CM | POA: Diagnosis not present

## 2014-05-04 DIAGNOSIS — Z880 Allergy status to penicillin: Secondary | ICD-10-CM | POA: Diagnosis not present

## 2014-05-04 DIAGNOSIS — Z794 Long term (current) use of insulin: Secondary | ICD-10-CM | POA: Insufficient documentation

## 2014-05-04 DIAGNOSIS — Z720 Tobacco use: Secondary | ICD-10-CM | POA: Insufficient documentation

## 2014-05-04 DIAGNOSIS — F329 Major depressive disorder, single episode, unspecified: Secondary | ICD-10-CM | POA: Insufficient documentation

## 2014-05-04 DIAGNOSIS — I10 Essential (primary) hypertension: Secondary | ICD-10-CM | POA: Diagnosis not present

## 2014-05-04 DIAGNOSIS — Z862 Personal history of diseases of the blood and blood-forming organs and certain disorders involving the immune mechanism: Secondary | ICD-10-CM | POA: Insufficient documentation

## 2014-05-04 DIAGNOSIS — I2510 Atherosclerotic heart disease of native coronary artery without angina pectoris: Secondary | ICD-10-CM | POA: Diagnosis not present

## 2014-05-04 DIAGNOSIS — Z791 Long term (current) use of non-steroidal anti-inflammatories (NSAID): Secondary | ICD-10-CM | POA: Insufficient documentation

## 2014-05-04 DIAGNOSIS — E669 Obesity, unspecified: Secondary | ICD-10-CM | POA: Diagnosis not present

## 2014-05-04 DIAGNOSIS — Z8701 Personal history of pneumonia (recurrent): Secondary | ICD-10-CM | POA: Insufficient documentation

## 2014-05-04 DIAGNOSIS — K529 Noninfective gastroenteritis and colitis, unspecified: Secondary | ICD-10-CM | POA: Diagnosis not present

## 2014-05-04 DIAGNOSIS — E119 Type 2 diabetes mellitus without complications: Secondary | ICD-10-CM | POA: Diagnosis not present

## 2014-05-04 DIAGNOSIS — R1110 Vomiting, unspecified: Secondary | ICD-10-CM | POA: Diagnosis present

## 2014-05-04 DIAGNOSIS — Z9889 Other specified postprocedural states: Secondary | ICD-10-CM | POA: Insufficient documentation

## 2014-05-04 LAB — BASIC METABOLIC PANEL
ANION GAP: 12 (ref 5–15)
BUN: 13 mg/dL (ref 6–23)
CALCIUM: 8.7 mg/dL (ref 8.4–10.5)
CO2: 22 mmol/L (ref 19–32)
CREATININE: 0.86 mg/dL (ref 0.50–1.10)
Chloride: 99 mEq/L (ref 96–112)
GFR calc Af Amer: >90 mL/min (ref >90)
GFR calc non Af Amer: 80 mL/min — ABNORMAL LOW (ref >90)
Glucose, Bld: 200 mg/dL — ABNORMAL HIGH (ref 70–99)
Potassium: 3 mmol/L — ABNORMAL LOW (ref 3.5–5.1)
Sodium: 133 mmol/L — ABNORMAL LOW (ref 135–145)

## 2014-05-04 LAB — CBC WITH DIFFERENTIAL/PLATELET
BASOS ABS: 0 10*3/uL (ref 0.0–0.1)
Basophils Relative: 0 % (ref 0–1)
EOS ABS: 0.1 10*3/uL (ref 0.0–0.7)
Eosinophils Relative: 1 % (ref 0–5)
HCT: 37.9 % (ref 36.0–46.0)
Hemoglobin: 12.4 g/dL (ref 12.0–15.0)
Lymphocytes Relative: 10 % — ABNORMAL LOW (ref 12–46)
Lymphs Abs: 0.9 10*3/uL (ref 0.7–4.0)
MCH: 25.6 pg — ABNORMAL LOW (ref 26.0–34.0)
MCHC: 32.7 g/dL (ref 30.0–36.0)
MCV: 78.1 fL (ref 78.0–100.0)
MONO ABS: 0.4 10*3/uL (ref 0.1–1.0)
Monocytes Relative: 4 % (ref 3–12)
Neutro Abs: 7.6 10*3/uL (ref 1.7–7.7)
Neutrophils Relative %: 85 % — ABNORMAL HIGH (ref 43–77)
Platelets: 209 10*3/uL (ref 150–400)
RBC: 4.85 MIL/uL (ref 3.87–5.11)
RDW: 16.7 % — ABNORMAL HIGH (ref 11.5–15.5)
WBC: 8.9 10*3/uL (ref 4.0–10.5)

## 2014-05-04 LAB — CBG MONITORING, ED: Glucose-Capillary: 208 mg/dL — ABNORMAL HIGH (ref 70–99)

## 2014-05-04 MED ORDER — ONDANSETRON HCL 4 MG/2ML IJ SOLN: 4.0000 mg | Freq: Once | INTRAMUSCULAR | Status: DC

## 2014-05-04 MED ORDER — SODIUM CHLORIDE 0.9 % IV BOLUS (SEPSIS)
1000.0000 mL | Freq: Once | INTRAVENOUS | Status: AC
  Administered 2014-05-04: 1000 mL via INTRAVENOUS

## 2014-05-04 MED ORDER — PROMETHAZINE HCL 25 MG/ML IJ SOLN
12.5000 mg | Freq: Once | INTRAMUSCULAR | Status: AC
  Administered 2014-05-04: 12.5 mg via INTRAVENOUS
  Filled 2014-05-04: qty 1

## 2014-05-04 MED ORDER — ONDANSETRON 4 MG PO TBDP
ORAL_TABLET | ORAL | Status: DC
Start: 2014-05-04 — End: 2014-11-26

## 2014-05-04 MED ORDER — METOCLOPRAMIDE HCL 5 MG/ML IJ SOLN
10.0000 mg | Freq: Once | INTRAMUSCULAR | Status: DC
  Filled 2014-05-04: qty 2

## 2014-05-04 MED ORDER — METOCLOPRAMIDE HCL 5 MG/ML IJ SOLN
10.0000 mg | Freq: Once | INTRAMUSCULAR | Status: AC
  Administered 2014-05-04: 10 mg via INTRAVENOUS

## 2014-05-04 MED ORDER — POTASSIUM CHLORIDE CRYS ER 20 MEQ PO TBCR
20.0000 meq | EXTENDED_RELEASE_TABLET | Freq: Once | ORAL | Status: AC
  Administered 2014-05-05: 20 meq via ORAL
  Filled 2014-05-04: qty 1

## 2014-05-04 NOTE — Telephone Encounter (Signed)
It is unfortunate she did not keep her appointment so she could be formally evaluated.  She should keep herself well hydrated orally and continue the tylenol and Zofran as needed.  With family members also inflicted with the same symptoms this is very suspicious for an infectious etiology and most likely represents a viral gastroenteritis.  Please advise her to call for an appointment for evaluation in the clinic if her symptoms do not resolve near the end of this week or she is unable to keep fluids down.  Thanks.

## 2014-05-04 NOTE — Telephone Encounter (Signed)
Pt called wanting refill of furosemide, cannot find in med list, reviewed hx, states she had side effects and it was stopped, she states she called and the person she spoke to told her to start taking it again, she does not know who she talked to. She states she has an appt today and will talk to the doctor

## 2014-05-04 NOTE — Discharge Instructions (Signed)
Drink liquids for 1 day and follow up if not improving

## 2014-05-04 NOTE — Telephone Encounter (Signed)
Pt has been informed and will call back if not better.

## 2014-05-04 NOTE — ED Provider Notes (Addendum)
CSN: [id number]     Arrival date & time 05/04/14  2013 History   First MD Initiated Contact with Patient 05/04/14 2033     Chief Complaint  Patient presents with  . Emesis  . Diarrhea     (Consider location/radiation/quality/duration/timing/severity/associated sxs/prior Treatment) HPI..... Multiple episodes of vomiting since earlier today. She also complains of diarrhea for the past several days. Patient is diabetic and hypertensive. She feels dehydrated. No chest pain, dyspnea, dysuria, cough.  Severity is moderate. No blood or mucus in stool.  Past Medical History  Diagnosis Date  . Arteriosclerotic cardiovascular disease (ASCVD)     Minimal at cath in [facility].stress nuclear study in 8/08 with nl EF; neg stress echo in 2010  . Diabetes mellitus, type 2 2000    Onset in 2000; no insulin  . Hyperlipidemia   . Hypertension `    during treatment with Geodon  . Gastroesophageal reflux disease     Schatzki's ring  . Anemia, iron deficiency   . Alcohol abuse   . Depression   . Community acquired pneumonia 01/03/10, 05/2010, 04/2012    2011; with pleural effusion-[facility] acute resp failure; intubated in Jan 2014 (HMPV pneumonia)  . Obesity   . Schizoaffective disorder     requiring multiple psychiatric admissions  . Dysphagia   . Diastolic dysfunction     grade 2 per echo 2011  . Pulmonary hypertension 05/02/2012    Patient needs repeat echo in 06/2012   . History of alcohol abuse 07/22/2007    Qualifier: Diagnosis of  By: Lenn Cal     Past Surgical History  Procedure Laterality Date  . Dilation and curettage, diagnostic / therapeutic  1992  . Esophagogastroduodenoscopy  09/16/08    Dr. Trevor Iha hiatal hernia/excoriations involving the cardia and mucosa consistent with trauma, antral erosions  of linear petechiae ? gastritis versus early gastric antral vascular  ectasia.Marland Kitchen biopsy showed reactive gastropathy. No H. pylori.  . Esophagogastroduodenoscopy  09/2007     Dr. Evalee Mutton ring, dilated to 45 French Maloney dilator, small hiatal hernia, antral erosions, biopsies reactive gastropathy.  Azzie Almas dilation  07/17/2011    [facility] sedation-->distal esophageal stricture s/p dilation, chronic gastritis, multiple ulcers in stomach. no h.pylori  . Colonoscopy  01/2006    internal hemorrhoids  . Colonoscopy  01/10/2012    Dr. Rourk:Single anal canal hemorrhoidal tag likely source of  trivial hematochezia; right-sided colonic diverticulosis  . Esophagogastroduodenoscopy (egd) with propofol N/A 12/17/2013    WYO:VZCHYIF antral erosions and petechiae. Small hiatal hernia. No endoscopic explanation for patient's symptoms   Family History  Problem Relation Age of Onset  . Colon cancer Other   . Hypertension Mother   . Stroke Father     deceased at age 56  . Heart disease Sister   . Anesthesia problems Neg Hx   . Hypotension Neg Hx   . Malignant hyperthermia Neg Hx   . Pseudochol deficiency Neg Hx    History  Substance Use Topics  . Smoking status: Current Every Day Smoker -- 0.75 packs/day for 30 years    Types: Cigarettes    Start date: 05/18/2012  . Smokeless tobacco: Never Used     Comment: cutting back,  . Alcohol Use: No     Comment: hx of ETOH abuse   OB History    Gravida Para Term Preterm AB TAB SAB Ectopic Multiple Living   3 2 2  1 1     2  Review of Systems  All other systems reviewed and are negative.     Allergies  Cephalexin; Metronidazole; Orange; Shrimp; Penicillins; Sulfonamide derivatives; Glipizide; and Sulfamethoxazole-trimethoprim  Home Medications   Prior to Admission medications   Medication Sig Start Date End Date Taking? Authorizing Provider  acetaminophen (TYLENOL) 500 MG tablet Take 1,000 mg by mouth every 6 (six) hours as needed for mild pain.    Historical Provider, MD  albuterol (PROVENTIL HFA;VENTOLIN HFA) 108 (90 BASE) MCG/ACT inhaler Inhale 2 puffs into the lungs 3 (three) times daily as  needed for wheezing or shortness of breath. Patient not taking: Reported on 03/17/2014 10/27/12   Rigoberto Noel, MD  albuterol (PROVENTIL) (2.5 MG/3ML) 0.083% nebulizer solution Take 3 mLs (2.5 mg total) by nebulization every 6 (six) hours as needed for wheezing or shortness of breath. 03/17/14   Otho Bellows, MD  Cetirizine HCl 10 MG CAPS Take 1 capsule (10 mg total) by mouth daily as needed. 02/22/14   Cresenciano Genre, MD  diclofenac sodium (VOLTAREN) 1 % GEL Apply 2 g topically 4 (four) times daily. Patient not taking: Reported on 03/17/2014 01/01/14   Dellia Nims, MD  esomeprazole (NEXIUM) 40 MG capsule Take 1 capsule (40 mg total) by mouth 2 (two) times daily before a meal. 02/10/14   Wilber Oliphant, MD  ferrous sulfate 325 (65 FE) MG tablet Take 1 tablet (325 mg total) by mouth 3 (three) times daily with meals. 12/10/13 12/10/14  Juluis Mire, MD  guaiFENesin-codeine 100-10 MG/5ML syrup Take 5 mLs by mouth every 4 (four) hours as needed for cough. 03/14/14   Kristen N Ward, DO  hydrOXYzine (ATARAX/VISTARIL) 10 MG tablet Take 1 tablet (10 mg total) by mouth 2 (two) times daily as needed for anxiety. 12/07/13   Juluis Mire, MD  ibuprofen (ADVIL,MOTRIN) 200 MG tablet Take 400 mg by mouth every 6 (six) hours as needed for mild pain.    Historical Provider, MD  ibuprofen (ADVIL,MOTRIN) 800 MG tablet Take 1 tablet (800 mg total) by mouth every 8 (eight) hours as needed for mild pain. 03/14/14   Kristen N Ward, DO  Insulin Glargine (LANTUS SOLOSTAR) 100 UNIT/ML Solostar Pen Inject 22 Units into the skin at bedtime. 12/07/13   Juluis Mire, MD  lisinopril (PRINIVIL,ZESTRIL) 40 MG tablet Take 1 tablet (40 mg total) by mouth daily. 02/22/14   Cresenciano Genre, MD  lovastatin (MEVACOR) 20 MG tablet Take 1 tablet (20 mg total) by mouth at bedtime. 09/01/13   Neema Bobbie Stack, MD  metFORMIN (GLUCOPHAGE) 1000 MG tablet TAKE (1) TABLET BY MOUTH TWICE DAILY WITH MEALS. 04/28/14   Julious Oka, MD  metoprolol  (LOPRESSOR) 100 MG tablet Take 1 tablet (100 mg total) by mouth 2 (two) times daily. 02/22/14   Cresenciano Genre, MD  mometasone (NASONEX) 50 MCG/ACT nasal spray Place 2 sprays into the nose 2 (two) times daily. Patient not taking: Reported on 03/17/2014 02/22/14   Cresenciano Genre, MD  ondansetron (ZOFRAN ODT) 4 MG disintegrating tablet Take 1 tablet (4 mg total) by mouth every 8 (eight) hours as needed for nausea or vomiting. 03/14/14   Kristen N Ward, DO  potassium chloride (K-DUR) 10 MEQ tablet Take 1 tablet (10 mEq total) by mouth daily. 12/24/13   Juluis Mire, MD  sucralfate (CARAFATE) 1 G tablet Take 1 g by mouth 4 (four) times daily -  with meals and at bedtime.    Historical Provider, MD  ziprasidone (GEODON) 80 MG capsule  Take 1 capsule (80 mg total) by mouth 2 (two) times daily with a meal. 03/26/12   Waylan Boga, NP   BP 141/85 mmHg  Pulse 124  Temp(Src) 99.6 F (37.6 C) (Oral)  Resp 20  Ht 5\' 4"  (1.626 m)  Wt 205 lb (92.987 kg)  BMI 35.17 kg/m2  SpO2 100%  LMP 04/16/2014 Physical Exam  Constitutional: She is oriented to person, place, and time.  Alert, mucous membranes appear dehydrated  HENT:  Head: Normocephalic and atraumatic.  Eyes: Conjunctivae and EOM are normal. Pupils are equal, round, and reactive to light.  Neck: Normal range of motion. Neck supple.  Cardiovascular: Normal rate and regular rhythm.   Pulmonary/Chest: Effort normal and breath sounds normal.  Abdominal: Soft. Bowel sounds are normal.  Musculoskeletal: Normal range of motion.  Neurological: She is alert and oriented to person, place, and time.  Skin: Skin is warm and dry.  Psychiatric: She has a normal mood and affect. Her behavior is normal.  Nursing note and vitals reviewed.   ED Course  Procedures (including critical care time) Labs Review Labs Reviewed  CBC WITH DIFFERENTIAL - Abnormal; Notable for the following:    MCH 25.6 (*)    RDW 16.7 (*)    Neutrophils Relative % 85 (*)     Lymphocytes Relative 10 (*)    All other components within normal limits  CBG MONITORING, ED - Abnormal; Notable for the following:    Glucose-Capillary 208 (*)    All other components within normal limits  BASIC METABOLIC PANEL  URINALYSIS, ROUTINE W REFLEX MICROSCOPIC    Imaging Review No results found.   EKG Interpretation None      MDM   Final diagnoses:  Gastroenteritis    Patient is not septic. IV fluids, labs, IV antiemetics  Recheck at 10:10     patient feels slightly better. Still nauseated. Not in DKA.  Will order Phenergan 12.5 IV. Continue IV fluids. Disc c Dr Roderic Palau  Nat Christen, MD 05/04/14 2138  Nat Christen, MD 05/04/14 [phone]

## 2014-05-04 NOTE — Telephone Encounter (Signed)
Pt called stating she was not able to keep appointment today.  She had diarrhea and vomiting in car so went home. Onset  Today when she got up. Diarrhea X 6 today, watery. She has a headache, and feels sick.  She has not taken any meds.  She does have Zofran and tylenol at home and wanted to know what else she can take.   The family has all had same virus symptoms lasting 2 days. Not sure if fever. She is a little weak, denies abd pain or  dizziness.

## 2014-05-04 NOTE — ED Notes (Signed)
Pt states she started having diarrhea "a few days ago" and started with vomiting this morning. Also states her BP has been running high (180/110) despite taking her BP meds.

## 2014-05-05 ENCOUNTER — Ambulatory Visit (INDEPENDENT_AMBULATORY_CARE_PROVIDER_SITE_OTHER): Payer: Medicare Other | Admitting: Internal Medicine

## 2014-05-05 VITALS — BP 148/72 | HR 84 | Temp 98.4°F | Wt 213.0 lb

## 2014-05-05 DIAGNOSIS — E785 Hyperlipidemia, unspecified: Secondary | ICD-10-CM | POA: Diagnosis not present

## 2014-05-05 DIAGNOSIS — D509 Iron deficiency anemia, unspecified: Secondary | ICD-10-CM

## 2014-05-05 DIAGNOSIS — A084 Viral intestinal infection, unspecified: Secondary | ICD-10-CM | POA: Diagnosis not present

## 2014-05-05 DIAGNOSIS — K219 Gastro-esophageal reflux disease without esophagitis: Secondary | ICD-10-CM

## 2014-05-05 DIAGNOSIS — I10 Essential (primary) hypertension: Secondary | ICD-10-CM | POA: Diagnosis not present

## 2014-05-05 DIAGNOSIS — K529 Noninfective gastroenteritis and colitis, unspecified: Secondary | ICD-10-CM | POA: Diagnosis not present

## 2014-05-05 DIAGNOSIS — E119 Type 2 diabetes mellitus without complications: Secondary | ICD-10-CM | POA: Diagnosis not present

## 2014-05-05 MED ORDER — FERROUS SULFATE 325 (65 FE) MG PO TABS: 325.0000 mg | ORAL_TABLET | Freq: Three times a day (TID) | ORAL | Status: DC

## 2014-05-05 MED ORDER — METOPROLOL TARTRATE 100 MG PO TABS: 100.0000 mg | ORAL_TABLET | Freq: Two times a day (BID) | ORAL | Status: DC

## 2014-05-05 MED ORDER — ESOMEPRAZOLE MAGNESIUM 40 MG PO CPDR: 40.0000 mg | DELAYED_RELEASE_CAPSULE | Freq: Two times a day (BID) | ORAL | Status: DC

## 2014-05-05 MED ORDER — METFORMIN HCL 1000 MG PO TABS: ORAL_TABLET | ORAL | Status: DC

## 2014-05-05 MED ORDER — PROMETHAZINE HCL 25 MG RE SUPP: 25.0000 mg | Freq: Four times a day (QID) | RECTAL | Status: DC | PRN

## 2014-05-05 MED ORDER — LISINOPRIL 40 MG PO TABS: 40.0000 mg | ORAL_TABLET | Freq: Every day | ORAL | Status: DC

## 2014-05-05 MED ORDER — LOVASTATIN 20 MG PO TABS: 20.0000 mg | ORAL_TABLET | Freq: Every day | ORAL | Status: DC

## 2014-05-05 NOTE — Patient Instructions (Signed)
Phenergan and zofran  can make you drowsy. If you noticed you are more drowsy than usual cut down on them.   If your symptoms do not get better within the next 10 days call our clinic for an appointment.   If you notice that you are feeling weaker, not able to keep down fluids or food, or feeling light headed call our clinic or go to the ED.   General Instructions:   Thank you for bringing your medicines today. This helps [name] keep you safe from mistakes.   Progress Toward Treatment Goals:  Treatment Goal 02/22/2014  Hemoglobin A1C at goal  Blood pressure deteriorated  Stop smoking smoking the same amount    Self Care Goals & Plans:  Self Care Goal 02/22/2014  Manage my medications take my medicines as prescribed; bring my medications to every visit; refill my medications on time; follow the sick day instructions if I am sick  Monitor my health keep track of my blood glucose; bring my glucose meter and log to each visit; keep track of my blood pressure; bring my blood pressure log to each visit; keep track of my weight; check my feet daily  Eat healthy foods drink diet soda or water instead of juice or soda; eat more vegetables; eat foods that are low in salt; eat baked foods instead of fried foods; eat fruit for snacks and desserts; eat smaller portions  Be physically active find an activity I enjoy  Stop smoking call [facility] (1-800-QUIT-NOW)  Meeting treatment goals maintain the current self-care plan    Home Blood Glucose Monitoring 02/22/2014  Check my blood sugar 3 times a day  When to check my blood sugar before meals     Care Management & Community Referrals:  Referral 02/22/2014  Referrals made for care management support none needed  Referrals made to community resources none

## 2014-05-06 DIAGNOSIS — A084 Viral intestinal infection, unspecified: Secondary | ICD-10-CM | POA: Insufficient documentation

## 2014-05-06 NOTE — Assessment & Plan Note (Signed)
Pt is non compliant w/ iron supplementation. Has difficulty remember to take it TID. Denies constipation. Given refill of ferrous sulfate 325mg  TID. Instructed to take colace if develops constipation from med. Recheck CBC at next visit in one month.

## 2014-05-06 NOTE — Assessment & Plan Note (Signed)
Refilled Nexium for GERD, has no complaints today regarding acid reflux.

## 2014-05-06 NOTE — Assessment & Plan Note (Signed)
BP Readings from Last 3 Encounters:  05/05/14 148/72  03/17/14 174/88  03/14/14 188/95    Lab Results  Component Value Date   NA 133* 05/04/2014   K 3.0* 05/04/2014   CREATININE 0.86 05/04/2014    Assessment: Blood pressure control:  controlled Progress toward BP goal:   at goal Comments: During last visit lisinopril was increased from 20mg  to 40mg , and lopressor was increased from 50 to 100mg . Lasix 20mg  was continued. Pt here for a f/u and requesting to be referred to Dr. Dorris Fetch for her HTN. Informed pt that her BP is controlled right now and that we have a plan in place for pt if her BP continued to be elevated despite recent increases in BP meds by adding norvasc. After that would start work up for secondary HTN. Pt okay with this.   Plan: Medications:  continue current medications Other plans: f/u in 1 month for DM and will check BP then.

## 2014-05-06 NOTE — Assessment & Plan Note (Signed)
LDL 48 in 01/2014. Given refill of statin.

## 2014-05-06 NOTE — Assessment & Plan Note (Signed)
Pt was extremely lethargic during exam, pt's name had to be called to wake pt up during med rec. Orthostatic vitals today were negative (150/86 pulse, 151/87 standing). Pt's sx are improving. She is tolerating po liquids and food. She is having less BMs. Informed pt to return to clinic if her sx do not get better within 7-10 days. Instructed to cut down on phernergan and zofran if she feels more drowsy than usual. However, pt likely lethargic due to poor sleep as she only took phenergan once day before clinic visit and zofran once morning of visit. She is also on geodon which she gets from mental health.

## 2014-05-06 NOTE — Progress Notes (Signed)
   Subjective:    Patient ID: [name], female    DOB: 1965-06-05, 49 y.o.   MRN: [id number]  HPI Pt is a 49 y/o female w/ PMHx of HTN, COPD, DM2, GERD, bipolar 1 d/o, and HLD who presents to clinic for HTN f/u. Pt was also seen in the ED on 1/19 for viral gastroenteritis. She was given NS IVFs, potassium repletion, and discharged from the ED. She was given phenergan and took one tab on 1/19 which helped control n/v. She then tried zofran morning of clinic and states it did not work as well. Pt had 10 BMs the previous day and 5 BMs day of clinic visit. Pt is able to keep down fluids and food. Her son recently was sick with the same symptoms as her which resolved after 2 days.   Preventative health: pt does not want pap smear at this time, follows at [facility] hospital for irregular menstrual cycles and will it there. Due for eye exam this year, had one last year that is not in our records.    Review of Systems  Constitutional: Positive for fever and chills.  Eyes: Negative for visual disturbance.  Cardiovascular: Negative for chest pain.  Gastrointestinal: Positive for diarrhea. Negative for nausea and vomiting.  Neurological: Positive for weakness and light-headedness.       Objective:   Physical Exam  Constitutional: She appears well-developed and well-nourished.  HENT:  Mouth/Throat: Oropharynx is clear and moist. No oropharyngeal exudate.  Eyes: Conjunctivae are normal.  Cardiovascular: Normal rate and regular rhythm.   Pulmonary/Chest: Effort normal and breath sounds normal.  Abdominal: Soft. Bowel sounds are normal. There is no tenderness.  Neurological:  lethargic  Skin: Skin is warm and dry.          Assessment & Plan:  Please see problem based charting.

## 2014-05-06 NOTE — Assessment & Plan Note (Signed)
Given refill of metformin, will f/u in 1 month for DM f/u and HbA1c

## 2014-05-11 NOTE — Progress Notes (Signed)
Case discussed with Dr. Truong at time of visit. We reviewed the resident's history and exam and pertinent patient test results. I agree with the assessment, diagnosis, and plan of care documented in the resident's note. 

## 2014-05-13 DIAGNOSIS — F319 Bipolar disorder, unspecified: Secondary | ICD-10-CM | POA: Diagnosis not present

## 2014-05-14 ENCOUNTER — Emergency Department (HOSPITAL_COMMUNITY)
Admission: EM | Admit: 2014-05-14 | Discharge: 2014-05-14 | Disposition: A | Discharge status: Home / Self Care | Payer: Medicare Other | Attending: Emergency Medicine | Admitting: Emergency Medicine

## 2014-05-14 ENCOUNTER — Encounter (HOSPITAL_COMMUNITY): Payer: Self-pay | Admitting: Physical Medicine and Rehabilitation

## 2014-05-14 DIAGNOSIS — K219 Gastro-esophageal reflux disease without esophagitis: Secondary | ICD-10-CM | POA: Diagnosis not present

## 2014-05-14 DIAGNOSIS — Z880 Allergy status to penicillin: Secondary | ICD-10-CM | POA: Insufficient documentation

## 2014-05-14 DIAGNOSIS — F259 Schizoaffective disorder, unspecified: Secondary | ICD-10-CM | POA: Insufficient documentation

## 2014-05-14 DIAGNOSIS — Z79899 Other long term (current) drug therapy: Secondary | ICD-10-CM | POA: Insufficient documentation

## 2014-05-14 DIAGNOSIS — R1013 Epigastric pain: Secondary | ICD-10-CM | POA: Insufficient documentation

## 2014-05-14 DIAGNOSIS — R109 Unspecified abdominal pain: Secondary | ICD-10-CM | POA: Diagnosis present

## 2014-05-14 DIAGNOSIS — Z8701 Personal history of pneumonia (recurrent): Secondary | ICD-10-CM | POA: Insufficient documentation

## 2014-05-14 DIAGNOSIS — Z794 Long term (current) use of insulin: Secondary | ICD-10-CM | POA: Insufficient documentation

## 2014-05-14 DIAGNOSIS — Z720 Tobacco use: Secondary | ICD-10-CM | POA: Diagnosis not present

## 2014-05-14 DIAGNOSIS — F329 Major depressive disorder, single episode, unspecified: Secondary | ICD-10-CM | POA: Diagnosis not present

## 2014-05-14 DIAGNOSIS — E785 Hyperlipidemia, unspecified: Secondary | ICD-10-CM | POA: Insufficient documentation

## 2014-05-14 DIAGNOSIS — E119 Type 2 diabetes mellitus without complications: Secondary | ICD-10-CM | POA: Insufficient documentation

## 2014-05-14 DIAGNOSIS — I10 Essential (primary) hypertension: Secondary | ICD-10-CM | POA: Insufficient documentation

## 2014-05-14 DIAGNOSIS — E669 Obesity, unspecified: Secondary | ICD-10-CM | POA: Insufficient documentation

## 2014-05-14 DIAGNOSIS — D509 Iron deficiency anemia, unspecified: Secondary | ICD-10-CM | POA: Diagnosis not present

## 2014-05-14 LAB — URINALYSIS, ROUTINE W REFLEX MICROSCOPIC
Bilirubin Urine: NEGATIVE
Glucose, UA: NEGATIVE mg/dL
Hgb urine dipstick: NEGATIVE
Ketones, ur: NEGATIVE mg/dL
Leukocytes, UA: NEGATIVE
NITRITE: NEGATIVE
PROTEIN: NEGATIVE mg/dL
SPECIFIC GRAVITY, URINE: 1.01 (ref 1.005–1.030)
Urobilinogen, UA: 0.2 mg/dL (ref 0.0–1.0)
pH: 6 (ref 5.0–8.0)

## 2014-05-14 LAB — CBC WITH DIFFERENTIAL/PLATELET
BASOS PCT: 0 % (ref 0–1)
Basophils Absolute: 0 10*3/uL (ref 0.0–0.1)
EOS PCT: 1 % (ref 0–5)
Eosinophils Absolute: 0.1 10*3/uL (ref 0.0–0.7)
HCT: 37.6 % (ref 36.0–46.0)
Hemoglobin: 12.2 g/dL (ref 12.0–15.0)
LYMPHS ABS: 2.3 10*3/uL (ref 0.7–4.0)
Lymphocytes Relative: 26 % (ref 12–46)
MCH: 25.3 pg — AB (ref 26.0–34.0)
MCHC: 32.4 g/dL (ref 30.0–36.0)
MCV: 78 fL (ref 78.0–100.0)
MONO ABS: 0.4 10*3/uL (ref 0.1–1.0)
Monocytes Relative: 4 % (ref 3–12)
NEUTROS ABS: 6 10*3/uL (ref 1.7–7.7)
NEUTROS PCT: 69 % (ref 43–77)
Platelets: 290 10*3/uL (ref 150–400)
RBC: 4.82 MIL/uL (ref 3.87–5.11)
RDW: 17.7 % — ABNORMAL HIGH (ref 11.5–15.5)
WBC: 8.9 10*3/uL (ref 4.0–10.5)

## 2014-05-14 LAB — COMPREHENSIVE METABOLIC PANEL
ALBUMIN: 3.6 g/dL (ref 3.5–5.2)
ALT: 13 U/L (ref 0–35)
ANION GAP: 6 (ref 5–15)
AST: 18 U/L (ref 0–37)
Alkaline Phosphatase: 72 U/L (ref 39–117)
BILIRUBIN TOTAL: 0.2 mg/dL — AB (ref 0.3–1.2)
BUN: 5 mg/dL — ABNORMAL LOW (ref 6–23)
CHLORIDE: 103 mmol/L (ref 96–112)
CO2: 27 mmol/L (ref 19–32)
Calcium: 9.2 mg/dL (ref 8.4–10.5)
Creatinine, Ser: 0.85 mg/dL (ref 0.50–1.10)
GFR, EST NON AFRICAN AMERICAN: 81 mL/min — AB (ref >90)
GLUCOSE: 153 mg/dL — AB (ref 70–99)
Potassium: 3.8 mmol/L (ref 3.5–5.1)
Sodium: 136 mmol/L (ref 135–145)
TOTAL PROTEIN: 6.7 g/dL (ref 6.0–8.3)

## 2014-05-14 LAB — CBG MONITORING, ED: Glucose-Capillary: 124 mg/dL — ABNORMAL HIGH (ref 70–99)

## 2014-05-14 MED ORDER — IBUPROFEN 400 MG PO TABS
600.0000 mg | ORAL_TABLET | Freq: Once | ORAL | Status: AC
  Administered 2014-05-14: 600 mg via ORAL
  Filled 2014-05-14 (×2): qty 1

## 2014-05-14 MED ORDER — ONDANSETRON HCL 4 MG/2ML IJ SOLN
4.0000 mg | Freq: Once | INTRAMUSCULAR | Status: AC
  Administered 2014-05-14: 4 mg via INTRAVENOUS
  Filled 2014-05-14: qty 2

## 2014-05-14 MED ORDER — ONDANSETRON HCL 4 MG/2ML IJ SOLN: 4.0000 mg | Freq: Once | INTRAMUSCULAR | Status: DC

## 2014-05-14 MED ORDER — SODIUM CHLORIDE 0.9 % IV BOLUS (SEPSIS)
1000.0000 mL | Freq: Once | INTRAVENOUS | Status: AC
  Administered 2014-05-14: 1000 mL via INTRAVENOUS

## 2014-05-14 MED ORDER — PANTOPRAZOLE SODIUM 40 MG IV SOLR: 40.0000 mg | Freq: Once | INTRAVENOUS | Status: DC

## 2014-05-14 MED ORDER — GI COCKTAIL ~~LOC~~
30.0000 mL | Freq: Once | ORAL | Status: AC
  Administered 2014-05-14: 30 mL via ORAL
  Filled 2014-05-14: qty 30

## 2014-05-14 MED ORDER — GI COCKTAIL ~~LOC~~: 30.0000 mL | Freq: Once | ORAL | Status: DC

## 2014-05-14 MED ORDER — PANTOPRAZOLE SODIUM 40 MG IV SOLR
40.0000 mg | Freq: Once | INTRAVENOUS | Status: AC
  Administered 2014-05-14: 40 mg via INTRAVENOUS
  Filled 2014-05-14: qty 40

## 2014-05-14 NOTE — ED Notes (Signed)
Pt comfortable with discharge and follow up instructions. No prescriptions. 

## 2014-05-14 NOTE — ED Provider Notes (Signed)
CSN: [id number]     Arrival date & time 05/14/14  7494 History   First MD Initiated Contact with Patient 05/14/14 1003     Chief Complaint  Patient presents with  . Abdominal Pain  . Hematemesis     (Consider location/radiation/quality/duration/timing/severity/associated sxs/prior Treatment) HPI [name] is a 49 year old female with past medical history of diabetes, hyperlipidemia, hypertension, GERD, anemia who presents to the ER for several episodes this morning of "spitting up blood". Patient states she had an acute onset of diffuse abdominal pain after waking up, which made her slightly nauseated. Patient states she looked in the back of her throat and noted some bright red blood. Patient states she tried to cough it up, then began "spitting up saliva with red streaks of blood in it". Patient states that her abdominal pain resolved on its own, and now in the ER she is asymptomatic. Patient states she has been experiencing diarrhea with multiple bowel movements per day for several weeks, and states she has been experiencing loose stools since she has began drinking lemon water every morning to help her lose weight.  Patient denies vomiting, fever, chest pain, shortness of breath, dizziness, weakness, dysuria.  Past Medical History  Diagnosis Date  . Arteriosclerotic cardiovascular disease (ASCVD)     Minimal at cath in [facility].stress nuclear study in 8/08 with nl EF; neg stress echo in 2010  . Diabetes mellitus, type 2 2000    Onset in 2000; no insulin  . Hyperlipidemia   . Hypertension `    during treatment with Geodon  . Gastroesophageal reflux disease     Schatzki's ring  . Anemia, iron deficiency   . Alcohol abuse   . Depression   . Community acquired pneumonia 01/03/10, 05/2010, 04/2012    2011; with pleural effusion-[facility] acute resp failure; intubated in Jan 2014 (HMPV pneumonia)  . Obesity   . Schizoaffective disorder     requiring multiple  psychiatric admissions  . Dysphagia   . Diastolic dysfunction     grade 2 per echo 2011  . Pulmonary hypertension 05/02/2012    Patient needs repeat echo in 06/2012   . History of alcohol abuse 07/22/2007    Qualifier: Diagnosis of  By: Lenn Cal     Past Surgical History  Procedure Laterality Date  . Dilation and curettage, diagnostic / therapeutic  1992  . Esophagogastroduodenoscopy  09/16/08    Dr. Trevor Iha hiatal hernia/excoriations involving the cardia and mucosa consistent with trauma, antral erosions  of linear petechiae ? gastritis versus early gastric antral vascular  ectasia.Marland Kitchen biopsy showed reactive gastropathy. No H. pylori.  . Esophagogastroduodenoscopy  09/2007    Dr. Evalee Mutton ring, dilated to 50 French Maloney dilator, small hiatal hernia, antral erosions, biopsies reactive gastropathy.  Azzie Almas dilation  07/17/2011    [facility] sedation-->distal esophageal stricture s/p dilation, chronic gastritis, multiple ulcers in stomach. no h.pylori  . Colonoscopy  01/2006    internal hemorrhoids  . Colonoscopy  01/10/2012    Dr. Rourk:Single anal canal hemorrhoidal tag likely source of  trivial hematochezia; right-sided colonic diverticulosis  . Esophagogastroduodenoscopy (egd) with propofol N/A 12/17/2013    [facility]:PRFFMBW antral erosions and petechiae. Small hiatal hernia. No endoscopic explanation for patient's symptoms   Family History  Problem Relation Age of Onset  . Colon cancer Other   . Hypertension Mother   . Stroke Father     deceased at age 66  . Heart disease Sister   . Anesthesia problems Neg  Hx   . Hypotension Neg Hx   . Malignant hyperthermia Neg Hx   . Pseudochol deficiency Neg Hx    History  Substance Use Topics  . Smoking status: Current Every Day Smoker -- 0.75 packs/day for 30 years    Types: Cigarettes    Start date: 05/18/2012  . Smokeless tobacco: Never Used     Comment: cutting back,  . Alcohol Use: No     Comment: hx of ETOH abuse   OB  History    Gravida Para Term Preterm AB TAB SAB Ectopic Multiple Living   3 2 2  1 1    2      Review of Systems  Constitutional: Negative for fever.  HENT: Negative for sore throat, trouble swallowing and voice change.   Eyes: Negative for visual disturbance.  Respiratory: Negative for shortness of breath.   Cardiovascular: Negative for chest pain.  Gastrointestinal: Positive for abdominal pain. Negative for nausea and vomiting.       "Spitting up blood"  Genitourinary: Negative for dysuria.  Musculoskeletal: Negative for neck pain.  Skin: Negative for rash.  Neurological: Negative for dizziness, weakness and numbness.  Psychiatric/Behavioral: Negative.       Allergies  Cephalexin; Metronidazole; Orange; Shrimp; Penicillins; Sulfonamide derivatives; Glipizide; and Sulfamethoxazole-trimethoprim  Home Medications   Prior to Admission medications   Medication Sig Start Date End Date Taking? Authorizing Provider  acetaminophen (TYLENOL) 500 MG tablet Take 1,000 mg by mouth every 6 (six) hours as needed for mild pain.    Historical Provider, MD  albuterol (PROVENTIL HFA;VENTOLIN HFA) 108 (90 BASE) MCG/ACT inhaler Inhale 2 puffs into the lungs 3 (three) times daily as needed for wheezing or shortness of breath. Patient not taking: Reported on 03/17/2014 10/27/12   Rigoberto Noel, MD  albuterol (PROVENTIL) (2.5 MG/3ML) 0.083% nebulizer solution Take 3 mLs (2.5 mg total) by nebulization every 6 (six) hours as needed for wheezing or shortness of breath. 03/17/14   Otho Bellows, MD  esomeprazole (NEXIUM) 40 MG capsule Take 1 capsule (40 mg total) by mouth 2 (two) times daily before a meal. 05/05/14   Julious Oka, MD  ferrous sulfate 325 (65 FE) MG tablet Take 1 tablet (325 mg total) by mouth 3 (three) times daily with meals. 05/05/14 05/05/15  Julious Oka, MD  furosemide (LASIX) 20 MG tablet TAKE 1 TABLET BY MOUTH ONCE DAILY. 05/05/14   Julious Oka, MD  ibuprofen (ADVIL,MOTRIN) 200 MG tablet  Take 400 mg by mouth every 6 (six) hours as needed for mild pain.    Historical Provider, MD  Insulin Glargine (LANTUS SOLOSTAR) 100 UNIT/ML Solostar Pen Inject 22 Units into the skin at bedtime. 12/07/13   Juluis Mire, MD  lisinopril (PRINIVIL,ZESTRIL) 40 MG tablet Take 1 tablet (40 mg total) by mouth daily. 05/05/14   Julious Oka, MD  lovastatin (MEVACOR) 20 MG tablet Take 1 tablet (20 mg total) by mouth at bedtime. 05/05/14   Julious Oka, MD  metFORMIN (GLUCOPHAGE) 1000 MG tablet TAKE (1) TABLET BY MOUTH TWICE DAILY WITH MEALS. 05/05/14   Julious Oka, MD  metoprolol (LOPRESSOR) 100 MG tablet Take 1 tablet (100 mg total) by mouth 2 (two) times daily. 05/05/14   Julious Oka, MD  mometasone (NASONEX) 50 MCG/ACT nasal spray Place 2 sprays into the nose 2 (two) times daily. Patient not taking: Reported on 03/17/2014 02/22/14   Cresenciano Genre, MD  ondansetron [facility] ODT) 4 MG disintegrating tablet 4mg  ODT q4 hours prn nausea/vomit 05/04/14  Maudry Diego, MD  potassium chloride (K-DUR) 10 MEQ tablet Take 1 tablet (10 mEq total) by mouth daily. 12/24/13   Juluis Mire, MD  promethazine (PHENERGAN) 25 MG suppository Place 1 suppository (25 mg total) rectally every 6 (six) hours as needed for nausea or vomiting. 05/05/14   Maudry Diego, MD  promethazine (PHENERGAN) 25 MG tablet Take 1 tablet (25 mg total) by mouth every 6 (six) hours as needed for nausea. 03/28/11 04/04/11  Fredia Sorrow, MD  promethazine (PHENERGAN) 25 MG tablet Take 1 tablet (25 mg total) by mouth every 6 (six) hours as needed for nausea. 04/06/11 04/13/11  Vivi Ferns, MD  sucralfate (CARAFATE) 1 G tablet Take 1 g by mouth 4 (four) times daily -  with meals and at bedtime.    Historical Provider, MD  ziprasidone (GEODON) 80 MG capsule Take 1 capsule (80 mg total) by mouth 2 (two) times daily with a meal. 03/26/12   Waylan Boga, NP   BP 193/98 mmHg  Pulse 72  Temp(Src) 98.5 F (36.9 C) (Oral)  Resp 16  SpO2 100%  LMP  04/16/2014 Physical Exam  Constitutional: She is oriented to person, place, and time. She appears well-developed and well-nourished. No distress.  HENT:  Head: Normocephalic and atraumatic.  Mouth/Throat: Oropharynx is clear and moist. No oropharyngeal exudate.  Eyes: Right eye exhibits no discharge. Left eye exhibits no discharge. No scleral icterus.  Neck: Normal range of motion.  Cardiovascular: Normal rate, regular rhythm and normal heart sounds.   No murmur heard. Pulmonary/Chest: Effort normal and breath sounds normal. No respiratory distress.  Abdominal: Soft. Normal appearance and bowel sounds are normal. There is tenderness in the epigastric area. There is no rigidity, no guarding, no tenderness at McBurney's point and negative Murphy's sign.  Mild epigastric tenderness noted.  Musculoskeletal: Normal range of motion. She exhibits no edema or tenderness.  Neurological: She is alert and oriented to person, place, and time. No cranial nerve deficit. Coordination normal.  Skin: Skin is warm and dry. No rash noted. She is not diaphoretic.  Psychiatric: She has a normal mood and affect.  Nursing note and vitals reviewed.   ED Course  Procedures (including critical care time) Labs Review Labs Reviewed  CBC WITH DIFFERENTIAL/PLATELET - Abnormal; Notable for the following:    MCH 25.3 (*)    RDW 17.7 (*)    All other components within normal limits  COMPREHENSIVE METABOLIC PANEL - Abnormal; Notable for the following:    Glucose, Bld 153 (*)    BUN 5 (*)    Total Bilirubin 0.2 (*)    GFR calc non Af Amer 81 (*)    All other components within normal limits  URINALYSIS, ROUTINE W REFLEX MICROSCOPIC - Abnormal; Notable for the following:    APPearance CLOUDY (*)    All other components within normal limits  CBG MONITORING, ED - Abnormal; Notable for the following:    Glucose-Capillary 124 (*)    All other components within normal limits    Imaging Review No results found.    EKG Interpretation None      MDM   Final diagnoses:  Epigastric abdominal pain    Patient is nontoxic, nonseptic appearing, in no apparent distress.  Patient's pain and other symptoms adequately managed in emergency department. Patient has remained asymptomatic during entire ER stay.  Labs, imaging and vitals reviewed.  Patient does not meet the SIRS or Sepsis criteria.  On repeat exam patient does not have  a surgical abdomin and there are no peritoneal signs.  No indication of appendicitis, bowel obstruction, bowel perforation, cholecystitis, diverticulitis, PID or ectopic pregnancy.  Patient discharged home with symptomatic treatment and given strict instructions for follow-up with their primary care physician.  I have also discussed reasons to return immediately to the ER.  Patient expresses understanding and agrees with plan. I encouraged patient to call or return to the ER should she have any questions or concerns.  BP 193/98 mmHg  Pulse 72  Temp(Src) 98.5 F (36.9 C) (Oral)  Resp 16  SpO2 100%  LMP 04/16/2014  Signed,  Dahlia Bailiff, PA-C 6:18 PM  Patient discussed with Dr. Virgel Manifold, M.D.         Carrie Mew, PA-C 05/14/14 1819  Virgel Manifold, MD 05/19/14 [phone]

## 2014-05-14 NOTE — ED Notes (Signed)
Pt presents to department for evaluation of diffuse abdominal pain and vomiting blood. Onset today. States she has been drinking lemon juice to lose weight. Pt is alert and oriented x4. NAD.

## 2014-05-14 NOTE — ED Notes (Signed)
Joe PA at bedside 

## 2014-05-14 NOTE — Discharge Instructions (Signed)
Follow-up with your primary care doctor. Return to the ER with any high fever, severe abdominal pain, nausea, vomiting, diarrhea.   Abdominal Pain Many things can cause abdominal pain. Usually, abdominal pain is not caused by a disease and will improve without treatment. It can often be observed and treated at home. Your health care provider will do a physical exam and possibly order blood tests and X-rays to help determine the seriousness of your pain. However, in many cases, more time must pass before a clear cause of the pain can be found. Before that point, your health care provider may not know if you need more testing or further treatment. HOME CARE INSTRUCTIONS  Monitor your abdominal pain for any changes. The following actions may help to alleviate any discomfort you are experiencing:  Only take over-the-counter or prescription medicines as directed by your health care provider.  Do not take laxatives unless directed to do so by your health care provider.  Try a clear liquid diet (broth, tea, or water) as directed by your health care provider. Slowly move to a bland diet as tolerated. SEEK MEDICAL CARE IF:  You have unexplained abdominal pain.  You have abdominal pain associated with nausea or diarrhea.  You have pain when you urinate or have a bowel movement.  You experience abdominal pain that wakes you in the night.  You have abdominal pain that is worsened or improved by eating food.  You have abdominal pain that is worsened with eating fatty foods.  You have a fever. SEEK IMMEDIATE MEDICAL CARE IF:   Your pain does not go away within 2 hours.  You keep throwing up (vomiting).  Your pain is felt only in portions of the abdomen, such as the right side or the left lower portion of the abdomen.  You pass bloody or black tarry stools. MAKE SURE YOU:  Understand these instructions.   Will watch your condition.   Will get help right away if you are not doing well or  get worse.  Document Released: 01/10/2005 Document Revised: 04/07/2013 Document Reviewed: 12/10/2012 [facility] Patient Information 2015 [place], [place]. This information is not intended to replace advice given to you by your health care provider. Make sure you discuss any questions you have with your health care provider.

## 2014-05-17 ENCOUNTER — Other Ambulatory Visit: Payer: Self-pay

## 2014-05-17 ENCOUNTER — Telehealth: Payer: Self-pay | Admitting: Internal Medicine

## 2014-05-17 DIAGNOSIS — D509 Iron deficiency anemia, unspecified: Secondary | ICD-10-CM

## 2014-05-17 NOTE — Telephone Encounter (Signed)
[facility] ER Friday AND WANTED TO LET YOU KNOW THAT SHE WAS VOMITING UP BLOOD WHEN SHE WENT TO THE ER .

## 2014-05-17 NOTE — Telephone Encounter (Signed)
Pt is aware. Lab order faxed to the lab. Pt already had ov scheduled with [facility] prior to going to the ED.

## 2014-05-17 NOTE — Addendum Note (Signed)
Addended by: Claudina Lick on: 05/17/2014 04:15 PM   Modules accepted: Orders

## 2014-05-17 NOTE — Telephone Encounter (Signed)
It is reported the patient is "coughing" up blood. If this is the case,  she needs to see her PCP or a pulmonary doctor; not the GI doctor and she probably needs to have another H&H in the next day or 2

## 2014-05-17 NOTE — Telephone Encounter (Signed)
I spoke with the pt. She said she went to the [facility] ED yesterday and she had been coughing up blood. She said the bleeding has stopped and the [facility] ED told her to follow up with [name].   She said she is feeling ok now.   Pt is aware that she has an ov with [facility] on Friday. She wants to know if its ok to wait until Friday? Or if [facility] has any recommendations for her until her ov on Friday?

## 2014-05-18 DIAGNOSIS — Z79899 Other long term (current) drug therapy: Secondary | ICD-10-CM | POA: Diagnosis not present

## 2014-05-18 LAB — HEMOGLOBIN AND HEMATOCRIT, BLOOD
HEMATOCRIT: 37.9 % (ref 36.0–46.0)
Hemoglobin: 11.8 g/dL — ABNORMAL LOW (ref 12.0–15.0)

## 2014-05-20 ENCOUNTER — Emergency Department (HOSPITAL_COMMUNITY): Payer: Medicare Other

## 2014-05-20 ENCOUNTER — Encounter (HOSPITAL_COMMUNITY): Payer: Self-pay

## 2014-05-20 ENCOUNTER — Ambulatory Visit: Payer: Medicare Other | Admitting: Internal Medicine

## 2014-05-20 ENCOUNTER — Emergency Department (HOSPITAL_COMMUNITY)
Admission: EM | Admit: 2014-05-20 | Discharge: 2014-05-20 | Disposition: A | Discharge status: Home / Self Care | Payer: Medicare Other | Attending: Emergency Medicine | Admitting: Emergency Medicine

## 2014-05-20 ENCOUNTER — Other Ambulatory Visit: Payer: Self-pay

## 2014-05-20 DIAGNOSIS — Z79899 Other long term (current) drug therapy: Secondary | ICD-10-CM | POA: Diagnosis not present

## 2014-05-20 DIAGNOSIS — F259 Schizoaffective disorder, unspecified: Secondary | ICD-10-CM | POA: Insufficient documentation

## 2014-05-20 DIAGNOSIS — I2510 Atherosclerotic heart disease of native coronary artery without angina pectoris: Secondary | ICD-10-CM | POA: Diagnosis not present

## 2014-05-20 DIAGNOSIS — Z8701 Personal history of pneumonia (recurrent): Secondary | ICD-10-CM | POA: Diagnosis not present

## 2014-05-20 DIAGNOSIS — R0789 Other chest pain: Secondary | ICD-10-CM | POA: Diagnosis not present

## 2014-05-20 DIAGNOSIS — J9811 Atelectasis: Secondary | ICD-10-CM | POA: Diagnosis not present

## 2014-05-20 DIAGNOSIS — E119 Type 2 diabetes mellitus without complications: Secondary | ICD-10-CM | POA: Insufficient documentation

## 2014-05-20 DIAGNOSIS — K219 Gastro-esophageal reflux disease without esophagitis: Secondary | ICD-10-CM | POA: Diagnosis not present

## 2014-05-20 DIAGNOSIS — E785 Hyperlipidemia, unspecified: Secondary | ICD-10-CM | POA: Insufficient documentation

## 2014-05-20 DIAGNOSIS — Z880 Allergy status to penicillin: Secondary | ICD-10-CM | POA: Insufficient documentation

## 2014-05-20 DIAGNOSIS — E669 Obesity, unspecified: Secondary | ICD-10-CM | POA: Insufficient documentation

## 2014-05-20 DIAGNOSIS — Z720 Tobacco use: Secondary | ICD-10-CM | POA: Diagnosis not present

## 2014-05-20 DIAGNOSIS — D509 Iron deficiency anemia, unspecified: Secondary | ICD-10-CM | POA: Diagnosis not present

## 2014-05-20 DIAGNOSIS — I10 Essential (primary) hypertension: Secondary | ICD-10-CM | POA: Diagnosis not present

## 2014-05-20 DIAGNOSIS — Z794 Long term (current) use of insulin: Secondary | ICD-10-CM | POA: Diagnosis not present

## 2014-05-20 DIAGNOSIS — E876 Hypokalemia: Secondary | ICD-10-CM

## 2014-05-20 DIAGNOSIS — R079 Chest pain, unspecified: Secondary | ICD-10-CM | POA: Diagnosis present

## 2014-05-20 LAB — BASIC METABOLIC PANEL
ANION GAP: 8 (ref 5–15)
BUN: 6 mg/dL (ref 6–23)
CALCIUM: 9.1 mg/dL (ref 8.4–10.5)
CO2: 23 mmol/L (ref 19–32)
Chloride: 105 mmol/L (ref 96–112)
Creatinine, Ser: 0.97 mg/dL (ref 0.50–1.10)
GFR, EST AFRICAN AMERICAN: 80 mL/min — AB (ref >90)
GFR, EST NON AFRICAN AMERICAN: 69 mL/min — AB (ref >90)
Glucose, Bld: 202 mg/dL — ABNORMAL HIGH (ref 70–99)
Potassium: 3.3 mmol/L — ABNORMAL LOW (ref 3.5–5.1)
SODIUM: 136 mmol/L (ref 135–145)

## 2014-05-20 LAB — CBC
HEMATOCRIT: 36.1 % (ref 36.0–46.0)
Hemoglobin: 11.6 g/dL — ABNORMAL LOW (ref 12.0–15.0)
MCH: 25 pg — ABNORMAL LOW (ref 26.0–34.0)
MCHC: 32.1 g/dL (ref 30.0–36.0)
MCV: 77.8 fL — ABNORMAL LOW (ref 78.0–100.0)
PLATELETS: 250 10*3/uL (ref 150–400)
RBC: 4.64 MIL/uL (ref 3.87–5.11)
RDW: 17.1 % — AB (ref 11.5–15.5)
WBC: 7.5 10*3/uL (ref 4.0–10.5)

## 2014-05-20 LAB — BRAIN NATRIURETIC PEPTIDE: B Natriuretic Peptide: 59.5 pg/mL (ref 0.0–100.0)

## 2014-05-20 LAB — I-STAT TROPONIN, ED: Troponin i, poc: 0 ng/mL (ref 0.00–0.08)

## 2014-05-20 MED ORDER — POTASSIUM CHLORIDE CRYS ER 20 MEQ PO TBCR: 20.0000 meq | EXTENDED_RELEASE_TABLET | Freq: Two times a day (BID) | ORAL | Status: DC

## 2014-05-20 MED ORDER — POTASSIUM CHLORIDE CRYS ER 20 MEQ PO TBCR
40.0000 meq | EXTENDED_RELEASE_TABLET | Freq: Once | ORAL | Status: AC
  Administered 2014-05-20: 40 meq via ORAL
  Filled 2014-05-20: qty 2

## 2014-05-20 NOTE — Discharge Instructions (Signed)
The clinic will call you tomorrow with an appointment. Call them if you do not hear from them by noon. You do need to be reevaluated for possible outpatient stress testing.  Stop taking K-Dur 10 mEq. start taking K-Dur 20 mEq twice a day.   Chest Pain (Nonspecific) It is often hard to give a specific diagnosis for the cause of chest pain. There is always a chance that your pain could be related to something serious, such as a heart attack or a blood clot in the lungs. You need to follow up with your health care provider for further evaluation. CAUSES   Heartburn.  Pneumonia or bronchitis.  Anxiety or stress.  Inflammation around your heart (pericarditis) or lung (pleuritis or pleurisy).  A blood clot in the lung.  A collapsed lung (pneumothorax). It can develop suddenly on its own (spontaneous pneumothorax) or from trauma to the chest.  Shingles infection (herpes zoster virus). The chest wall is composed of bones, muscles, and cartilage. Any of these can be the source of the pain.  The bones can be bruised by injury.  The muscles or cartilage can be strained by coughing or overwork.  The cartilage can be affected by inflammation and become sore (costochondritis). DIAGNOSIS  Lab tests or other studies may be needed to find the cause of your pain. Your health care provider may have you take a test called an ambulatory electrocardiogram (ECG). An ECG records your heartbeat patterns over a 24-hour period. You may also have other tests, such as:  Transthoracic echocardiogram (TTE). During echocardiography, sound waves are used to evaluate how blood flows through your heart.  Transesophageal echocardiogram (TEE).  Cardiac monitoring. This allows your health care provider to monitor your heart rate and rhythm in real time.  Holter monitor. This is a portable device that records your heartbeat and can help diagnose heart arrhythmias. It allows your health care provider to track your  heart activity for several days, if needed.  Stress tests by exercise or by giving medicine that makes the heart beat faster. TREATMENT   Treatment depends on what may be causing your chest pain. Treatment may include:  Acid blockers for heartburn.  Anti-inflammatory medicine.  Pain medicine for inflammatory conditions.  Antibiotics if an infection is present.  You may be advised to change lifestyle habits. This includes stopping smoking and avoiding alcohol, caffeine, and chocolate.  You may be advised to keep your head raised (elevated) when sleeping. This reduces the chance of acid going backward from your stomach into your esophagus. Most of the time, nonspecific chest pain will improve within 2-3 days with rest and mild pain medicine.  HOME CARE INSTRUCTIONS   If antibiotics were prescribed, take them as directed. Finish them even if you start to feel better.  For the next few days, avoid physical activities that bring on chest pain. Continue physical activities as directed.  Do not use any tobacco products, including cigarettes, chewing tobacco, or electronic cigarettes.  Avoid drinking alcohol.  Only take medicine as directed by your health care provider.  Follow your health care provider's suggestions for further testing if your chest pain does not go away.  Keep any follow-up appointments you made. If you do not go to an appointment, you could develop lasting (chronic) problems with pain. If there is any problem keeping an appointment, call to reschedule. SEEK MEDICAL CARE IF:   Your chest pain does not go away, even after treatment.  You have a rash with blisters  on your chest.  You have a fever. SEEK IMMEDIATE MEDICAL CARE IF:   You have increased chest pain or pain that spreads to your arm, neck, jaw, back, or abdomen.  You have shortness of breath.  You have an increasing cough, or you cough up blood.  You have severe back or abdominal pain.  You feel  nauseous or vomit.  You have severe weakness.  You faint.  You have chills. This is an emergency. Do not wait to see if the pain will go away. Get medical help at once. Call your local emergency services (911 in [place] Do not drive yourself to the hospital. MAKE SURE YOU:   Understand these instructions.  Will watch your condition.  Will get help right away if you are not doing well or get worse. Document Released: 01/10/2005 Document Revised: 04/07/2013 Document Reviewed: 11/06/2007 [facility] Patient Information 2015 [place], [place]. This information is not intended to replace advice given to you by your health care provider. Make sure you discuss any questions you have with your health care provider.  Smoking Hazards Smoking cigarettes is extremely bad for your health. Tobacco smoke has over 200 known poisons in it. It contains the poisonous gases nitrogen oxide and carbon monoxide. There are over 60 chemicals in tobacco smoke that cause cancer. Some of the chemicals found in cigarette smoke include:   Cyanide.   Benzene.   Formaldehyde.   Methanol (wood alcohol).   Acetylene (fuel used in welding torches).   Ammonia.  Even smoking lightly shortens your life expectancy by several years. You can greatly reduce the risk of medical problems for you and your family by stopping now. Smoking is the most preventable cause of death and disease in our society. Within days of quitting smoking, your circulation improves, you decrease the risk of having a heart attack, and your lung capacity improves. There may be some increased phlegm in the first few days after quitting, and it may take months for your lungs to clear up completely. Quitting for 10 years reduces your risk of developing lung cancer to almost that of a nonsmoker.  WHAT ARE THE RISKS OF SMOKING? Cigarette smokers have an increased risk of many serious medical problems, including:  Lung cancer.   Lung disease (such as  pneumonia, bronchitis, and emphysema).   Heart attack and chest pain due to the heart not getting enough oxygen (angina).   Heart disease and peripheral blood vessel disease.   Hypertension.   Stroke.   Oral cancer (cancer of the lip, mouth, or voice box).   Bladder cancer.   Pancreatic cancer.   Cervical cancer.   Pregnancy complications, including premature birth.   Stillbirths and smaller newborn babies, birth defects, and genetic damage to sperm.   Early menopause.   Lower estrogen level for women.   Infertility.   Facial wrinkles.   Blindness.   Increased risk of broken bones (fractures).   Senile dementia.   Stomach ulcers and internal bleeding.   Delayed wound healing and increased risk of complications during surgery. Because of secondhand smoke exposure, children of smokers have an increased risk of the following:   Sudden infant death syndrome (SIDS).   Respiratory infections.   Lung cancer.   Heart disease.   Ear infections.  WHY IS SMOKING ADDICTIVE? Nicotine is the chemical agent in tobacco that is capable of causing addiction or dependence. When you smoke and inhale, nicotine is absorbed rapidly into the bloodstream through your lungs. Both inhaled and  noninhaled nicotine may be addictive.  WHAT ARE THE BENEFITS OF QUITTING?  There are many health benefits to quitting smoking. Some are:   The likelihood of developing cancer and heart disease decreases. Health improvements are seen almost immediately.   Blood pressure, pulse rate, and breathing patterns start returning to normal soon after quitting.   People who quit may see an improvement in their overall quality of life.  HOW DO YOU QUIT SMOKING? Smoking is an addiction with both physical and psychological effects, and longtime habits can be hard to change. Your health care provider can recommend:  Programs and community resources, which may include group  support, education, or therapy.  Replacement products, such as patches, gum, and nasal sprays. Use these products only as directed. Do not replace cigarette smoking with electronic cigarettes (commonly called e-cigarettes). The safety of e-cigarettes is unknown, and some may contain harmful chemicals. FOR MORE INFORMATION  American Lung Association: www.lung.org  American Cancer Society: www.cancer.org Document Released: 05/10/2004 Document Revised: 01/21/2013 Document Reviewed: 09/22/2012 [facility] Patient Information 2015 [place], [place]. This information is not intended to replace advice given to you by your health care provider. Make sure you discuss any questions you have with your health care provider.

## 2014-05-20 NOTE — ED Provider Notes (Signed)
CSN: [id number]     Arrival date & time 05/20/14  1440 History   First MD Initiated Contact with Patient 05/20/14 1722     Chief complaint: Chest pain  (Consider location/radiation/quality/duration/timing/severity/associated sxs/prior Treatment) The history is provided by the patient.   49 year old female has been having episodes of chest pain today. She describes it as a wrapping feeling across her chest. She rated it at 6/10. Pain would last for about 10 minutes before resolving and then would recur again. Pain seems to come on when she is trying to relax and it is not exertional. There is no associated dyspnea, nausea, diaphoresis. She does have cardiac risk factors of tobacco use, diabetes, hypertension, hyperlipidemia. She had been admitted for evaluation of chest pain last year and had a negative stress test. Today's pain is somewhat similar to what she had at that time.  Past Medical History  Diagnosis Date  . Arteriosclerotic cardiovascular disease (ASCVD)     Minimal at cath in [facility].stress nuclear study in 8/08 with nl EF; neg stress echo in 2010  . Diabetes mellitus, type 2 2000    Onset in 2000; no insulin  . Hyperlipidemia   . Hypertension `    during treatment with Geodon  . Gastroesophageal reflux disease     Schatzki's ring  . Anemia, iron deficiency   . Alcohol abuse   . Depression   . Community acquired pneumonia 01/03/10, 05/2010, 04/2012    2011; with pleural effusion-[facility] acute resp failure; intubated in Jan 2014 (HMPV pneumonia)  . Obesity   . Schizoaffective disorder     requiring multiple psychiatric admissions  . Dysphagia   . Diastolic dysfunction     grade 2 per echo 2011  . Pulmonary hypertension 05/02/2012    Patient needs repeat echo in 06/2012   . History of alcohol abuse 07/22/2007    Qualifier: Diagnosis of  By: Lenn Cal     Past Surgical History  Procedure Laterality Date  . Dilation and curettage, diagnostic / therapeutic   1992  . Esophagogastroduodenoscopy  09/16/08    Dr. Trevor Iha hiatal hernia/excoriations involving the cardia and mucosa consistent with trauma, antral erosions  of linear petechiae ? gastritis versus early gastric antral vascular  ectasia.Marland Kitchen biopsy showed reactive gastropathy. No H. pylori.  . Esophagogastroduodenoscopy  09/2007    Dr. Evalee Mutton ring, dilated to 9 French Maloney dilator, small hiatal hernia, antral erosions, biopsies reactive gastropathy.  Azzie Almas dilation  07/17/2011    [facility] sedation-->distal esophageal stricture s/p dilation, chronic gastritis, multiple ulcers in stomach. no h.pylori  . Colonoscopy  01/2006    internal hemorrhoids  . Colonoscopy  01/10/2012    Dr. Rourk:Single anal canal hemorrhoidal tag likely source of  trivial hematochezia; right-sided colonic diverticulosis  . Esophagogastroduodenoscopy (egd) with propofol N/A 12/17/2013    JAS:[id number] antral erosions and petechiae. Small hiatal hernia. No endoscopic explanation for patient's symptoms   Family History  Problem Relation Age of Onset  . Colon cancer Other   . Hypertension Mother   . Stroke Father     deceased at age 68  . Heart disease Sister   . Anesthesia problems Neg Hx   . Hypotension Neg Hx   . Malignant hyperthermia Neg Hx   . Pseudochol deficiency Neg Hx    History  Substance Use Topics  . Smoking status: Current Every Day Smoker -- 0.75 packs/day for 30 years    Types: Cigarettes    Start date:  05/18/2012  . Smokeless tobacco: Never Used     Comment: cutting back,  . Alcohol Use: No     Comment: hx of ETOH abuse   OB History    Gravida Para Term Preterm AB TAB SAB Ectopic Multiple Living   3 2 2  1 1    2      Review of Systems  All other systems reviewed and are negative.     Allergies  Cephalexin; Metronidazole; Orange; Shrimp; Penicillins; Sulfonamide derivatives; Glipizide; and Sulfamethoxazole-trimethoprim  Home Medications   Prior to Admission  medications   Medication Sig Start Date End Date Taking? Authorizing Provider  acetaminophen (TYLENOL) 500 MG tablet Take 1,000 mg by mouth every 6 (six) hours as needed for mild pain.    Historical Provider, MD  albuterol (PROVENTIL HFA;VENTOLIN HFA) 108 (90 BASE) MCG/ACT inhaler Inhale 2 puffs into the lungs 3 (three) times daily as needed for wheezing or shortness of breath. Patient not taking: Reported on 03/17/2014 10/27/12   Rigoberto Noel, MD  albuterol (PROVENTIL) (2.5 MG/3ML) 0.083% nebulizer solution Take 3 mLs (2.5 mg total) by nebulization every 6 (six) hours as needed for wheezing or shortness of breath. 03/17/14   Otho Bellows, MD  esomeprazole (NEXIUM) 40 MG capsule Take 1 capsule (40 mg total) by mouth 2 (two) times daily before a meal. 05/05/14   Julious Oka, MD  ferrous sulfate 325 (65 FE) MG tablet Take 1 tablet (325 mg total) by mouth 3 (three) times daily with meals. 05/05/14 05/05/15  Julious Oka, MD  furosemide (LASIX) 20 MG tablet TAKE 1 TABLET BY MOUTH ONCE DAILY. 05/05/14   Julious Oka, MD  ibuprofen (ADVIL,MOTRIN) 200 MG tablet Take 400 mg by mouth every 6 (six) hours as needed for mild pain.    Historical Provider, MD  Insulin Glargine (LANTUS SOLOSTAR) 100 UNIT/ML Solostar Pen Inject 22 Units into the skin at bedtime. 12/07/13   Juluis Mire, MD  lisinopril (PRINIVIL,ZESTRIL) 40 MG tablet Take 1 tablet (40 mg total) by mouth daily. 05/05/14   Julious Oka, MD  lovastatin (MEVACOR) 20 MG tablet Take 1 tablet (20 mg total) by mouth at bedtime. 05/05/14   Julious Oka, MD  metFORMIN (GLUCOPHAGE) 1000 MG tablet TAKE (1) TABLET BY MOUTH TWICE DAILY WITH MEALS. 05/05/14   Julious Oka, MD  metoprolol (LOPRESSOR) 100 MG tablet Take 1 tablet (100 mg total) by mouth 2 (two) times daily. 05/05/14   Julious Oka, MD  mometasone (NASONEX) 50 MCG/ACT nasal spray Place 2 sprays into the nose 2 (two) times daily. Patient not taking: Reported on 03/17/2014 02/22/14   Cresenciano Genre, MD   ondansetron (ZOFRAN ODT) 4 MG disintegrating tablet 4mg  ODT q4 hours prn nausea/vomit 05/04/14   Maudry Diego, MD  potassium chloride (K-DUR) 10 MEQ tablet Take 1 tablet (10 mEq total) by mouth daily. 12/24/13   Juluis Mire, MD  promethazine (PHENERGAN) 25 MG suppository Place 1 suppository (25 mg total) rectally every 6 (six) hours as needed for nausea or vomiting. 05/05/14   Maudry Diego, MD  promethazine (PHENERGAN) 25 MG tablet Take 1 tablet (25 mg total) by mouth every 6 (six) hours as needed for nausea. 03/28/11 04/04/11  Fredia Sorrow, MD  promethazine (PHENERGAN) 25 MG tablet Take 1 tablet (25 mg total) by mouth every 6 (six) hours as needed for nausea. 04/06/11 04/13/11  Vivi Ferns, MD  sucralfate (CARAFATE) 1 G tablet Take 1 g by mouth 4 (four) times daily -  with meals and at bedtime.    Historical Provider, MD  ziprasidone (GEODON) 80 MG capsule Take 1 capsule (80 mg total) by mouth 2 (two) times daily with a meal. 03/26/12   Waylan Boga, NP   BP 148/86 mmHg  Pulse 72  Temp(Src) 97.7 F (36.5 C) (Oral)  Resp 20  Ht 5\' 4"  (1.626 m)  Wt 205 lb (92.987 kg)  BMI 35.17 kg/m2  SpO2 96%  LMP 03/22/2014 Physical Exam  Nursing note and vitals reviewed.  49 year old female, resting comfortably and in no acute distress. Vital signs are significant for mild hypertension. Oxygen saturation is 96%, which is normal. Head is normocephalic and atraumatic. PERRLA, EOMI. Oropharynx is clear. Neck is nontender and supple without adenopathy or JVD. Back is nontender and there is no CVA tenderness. Lungs are clear without rales, wheezes, or rhonchi. Chest is nontender. Heart has regular rate and rhythm without murmur. Abdomen is soft, flat, nontender without masses or hepatosplenomegaly and peristalsis is normoactive. Extremities have no cyanosis or edema, full range of motion is present. Skin is warm and dry without rash. Neurologic: Mental status is normal, cranial nerves are  intact, there are no motor or sensory deficits.  ED Course  Procedures (including critical care time) Labs Review Results for orders placed or performed during the hospital encounter of 05/20/14  CBC  Result Value Ref Range   WBC 7.5 4.0 - 10.5 K/uL   RBC 4.64 3.87 - 5.11 MIL/uL   Hemoglobin 11.6 (L) 12.0 - 15.0 g/dL   HCT 36.1 36.0 - 46.0 %   MCV 77.8 (L) 78.0 - 100.0 fL   MCH 25.0 (L) 26.0 - 34.0 pg   MCHC 32.1 30.0 - 36.0 g/dL   RDW 17.1 (H) 11.5 - 15.5 %   Platelets 250 150 - 400 K/uL  Basic metabolic panel  Result Value Ref Range   Sodium 136 135 - 145 mmol/L   Potassium 3.3 (L) 3.5 - 5.1 mmol/L   Chloride 105 96 - 112 mmol/L   CO2 23 19 - 32 mmol/L   Glucose, Bld 202 (H) 70 - 99 mg/dL   BUN 6 6 - 23 mg/dL   Creatinine, Ser 0.97 0.50 - 1.10 mg/dL   Calcium 9.1 8.4 - 10.5 mg/dL   GFR calc non Af Amer 69 (L) >90 mL/min   GFR calc Af Amer 80 (L) >90 mL/min   Anion gap 8 5 - 15  BNP (order ONLY if patient complains of dyspnea/SOB AND you have documented it for THIS visit)  Result Value Ref Range   B Natriuretic Peptide 59.5 0.0 - 100.0 pg/mL  I-stat troponin, ED (not at [facility])  Result Value Ref Range   Troponin i, poc 0.00 0.00 - 0.08 ng/mL   Comment 3           Imaging Review Dg Chest 2 View  05/20/2014   CLINICAL DATA:  Chest pain and dyspnea with exertion.  Dry cough  EXAM: CHEST  2 VIEW  COMPARISON:  March 14, 2014  FINDINGS: There is minimal left mid lung atelectasis. Lungs are otherwise clear. Heart size and pulmonary vascularity are normal. No adenopathy. No bone lesions.  IMPRESSION: Minimal left mid lung atelectasis.  Lungs otherwise clear.   Electronically Signed   By: Lowella Grip M.D.   On: 05/20/2014 15:39     EKG Interpretation   Date/Time:  Thursday May 20 2014 14:46:16 EST Ventricular Rate:  75 PR Interval:  114 QRS Duration:  74 QT Interval:  428 QTC Calculation: 477 R Axis:   72 Text Interpretation:  Normal sinus rhythm Cannot rule out  Anterior infarct  , age undetermined Abnormal ECG When compared with ECG of 03/14/2014, No  significant change was found Confirmed by [facility]  MD, Amarii Amy ([id number]) on  05/20/2014 5:09:40 PM      MDM   Final diagnoses:  Nonspecific chest pain  Iron deficiency anemia  Hypokalemia    Atypical chest pain. Old records are reviewed confirming a hospitalization for atypical chest pain last June with negative stress Myoview. She is far enough away from the stresses that it does not carry a strong diagnostic value. However, symptoms are nonspecific and do not feel she needs hospitalization today. She is referred back to her clinic. I discussed the case with Dr. Eulas Post who is leaving message with the clinic office to contact her tomorrow for an appointment as soon as possible. She is noted to be hypokalemic. At home, she is taking K-Dur 10 mEq once a day. She is on furosemide. This is clearly not sufficient. She is given a dose of potassium in the ED and she is given a prescription for K-Dur 20 mEq twice a day.     Delora Fuel, MD 74/73/40 [id number]

## 2014-05-20 NOTE — ED Notes (Signed)
Pt presents with 1 hour h/o chest pain, reports pain began on R side, and radiates to L side, reports R shoulder pain.  Reports shortness of breath with exertion, reports dry cough.

## 2014-05-21 ENCOUNTER — Ambulatory Visit (INDEPENDENT_AMBULATORY_CARE_PROVIDER_SITE_OTHER): Payer: Medicare Other | Admitting: Internal Medicine

## 2014-05-21 ENCOUNTER — Ambulatory Visit (INDEPENDENT_AMBULATORY_CARE_PROVIDER_SITE_OTHER): Payer: Medicare Other

## 2014-05-21 ENCOUNTER — Encounter: Payer: Self-pay | Admitting: Internal Medicine

## 2014-05-21 ENCOUNTER — Other Ambulatory Visit: Payer: Self-pay

## 2014-05-21 VITALS — BP 182/102 | HR 77 | Temp 98.5°F | Ht 64.0 in | Wt 208.6 lb

## 2014-05-21 DIAGNOSIS — R197 Diarrhea, unspecified: Secondary | ICD-10-CM

## 2014-05-21 DIAGNOSIS — R194 Change in bowel habit: Secondary | ICD-10-CM

## 2014-05-21 DIAGNOSIS — D500 Iron deficiency anemia secondary to blood loss (chronic): Secondary | ICD-10-CM | POA: Diagnosis not present

## 2014-05-21 DIAGNOSIS — K219 Gastro-esophageal reflux disease without esophagitis: Secondary | ICD-10-CM

## 2014-05-21 LAB — IFOBT (OCCULT BLOOD): IFOBT: NEGATIVE

## 2014-05-21 NOTE — Progress Notes (Signed)
Pt return IFOBT test and it was NEGATIVE. 

## 2014-05-21 NOTE — Progress Notes (Signed)
Primary Care Physician:  Julious Oka, MD Primary Gastroenterologist:  Dr. Gala Romney  Pre-Procedure History & Physical: HPI:  [name] is a 49 y.o. female here for evaluation of GERD.  Seen in the ED a couple times over the past couple weeks when noticed blood in the back of her throat. She's been coughing quite a bit;  hasn't really had any nausea or vomiting. Has had chronic maxillary headaches  / "sinus"drainage and congestion. She tells me she has not taken any antihistamines or other agents for this problem because of sedation. She does take  occasional ibuprofen. She continues to smoke one pack of cigarettes per day. No dysphagia, early satiety, nausea or vomiting. She has lost a pound since her October 2015 visit here. Blood sugars had been under less than ideal control. Does not take her iron supplement for IDA on a regular basis. Continues to take Nexium 40 mg twice daily. Negative chest x-ray in the ED. No ENT evaluation. Colonoscopy about 2 and half years ago without significant findings. EGD in September with mild gastric erosions. It is very minimal microcytic anemia with a hemoglobin of 11.6. Immuno-fecal occult blood testing on a stool sample through this office came back negative this morning.  Past Medical History  Diagnosis Date  . Arteriosclerotic cardiovascular disease (ASCVD)     Minimal at cath in [facility].stress nuclear study in 8/08 with nl EF; neg stress echo in 2010  . Diabetes mellitus, type 2 2000    Onset in 2000; no insulin  . Hyperlipidemia   . Hypertension `    during treatment with Geodon  . Gastroesophageal reflux disease     Schatzki's ring  . Anemia, iron deficiency   . Alcohol abuse   . Depression   . Community acquired pneumonia 01/03/10, 05/2010, 04/2012    2011; with pleural effusion-[facility] acute resp failure; intubated in Jan 2014 (HMPV pneumonia)  . Obesity   . Schizoaffective disorder     requiring multiple psychiatric  admissions  . Dysphagia   . Diastolic dysfunction     grade 2 per echo 2011  . Pulmonary hypertension 05/02/2012    Patient needs repeat echo in 06/2012   . History of alcohol abuse 07/22/2007    Qualifier: Diagnosis of  By: Lenn Cal      Past Surgical History  Procedure Laterality Date  . Dilation and curettage, diagnostic / therapeutic  1992  . Esophagogastroduodenoscopy  09/16/08    Dr. Trevor Iha hiatal hernia/excoriations involving the cardia and mucosa consistent with trauma, antral erosions  of linear petechiae ? gastritis versus early gastric antral vascular  ectasia.Marland Kitchen biopsy showed reactive gastropathy. No H. pylori.  . Esophagogastroduodenoscopy  09/2007    Dr. Evalee Mutton ring, dilated to 65 French Maloney dilator, small hiatal hernia, antral erosions, biopsies reactive gastropathy.  Azzie Almas dilation  07/17/2011    [facility] sedation-->distal esophageal stricture s/p dilation, chronic gastritis, multiple ulcers in stomach. no h.pylori  . Colonoscopy  01/2006    internal hemorrhoids  . Colonoscopy  01/10/2012    Dr. Rourk:Single anal canal hemorrhoidal tag likely source of  trivial hematochezia; right-sided colonic diverticulosis  . Esophagogastroduodenoscopy (egd) with propofol N/A 12/17/2013    JQB:HALPFXT antral erosions and petechiae. Small hiatal hernia. No endoscopic explanation for patient's symptoms    Prior to Admission medications   Medication Sig Start Date End Date Taking? Authorizing Provider  acetaminophen (TYLENOL) 500 MG tablet Take 1,000 mg by mouth every 6 (six) hours  as needed for mild pain.   Yes Historical Provider, MD  albuterol (PROVENTIL HFA;VENTOLIN HFA) 108 (90 BASE) MCG/ACT inhaler Inhale 2 puffs into the lungs 3 (three) times daily as needed for wheezing or shortness of breath. 10/27/12  Yes Rigoberto Noel, MD  albuterol (PROVENTIL) (2.5 MG/3ML) 0.083% nebulizer solution Take 3 mLs (2.5 mg total) by nebulization every 6 (six) hours as needed for  wheezing or shortness of breath. 03/17/14  Yes Otho Bellows, MD  esomeprazole (NEXIUM) 40 MG capsule Take 1 capsule (40 mg total) by mouth 2 (two) times daily before a meal. 05/05/14  Yes Julious Oka, MD  ferrous sulfate 325 (65 FE) MG tablet Take 1 tablet (325 mg total) by mouth 3 (three) times daily with meals. 05/05/14 05/05/15 Yes Julious Oka, MD  furosemide (LASIX) 20 MG tablet TAKE 1 TABLET BY MOUTH ONCE DAILY. 05/05/14  Yes Julious Oka, MD  ibuprofen (ADVIL,MOTRIN) 200 MG tablet Take 400 mg by mouth every 6 (six) hours as needed for mild pain.   Yes Historical Provider, MD  Insulin Glargine (LANTUS SOLOSTAR) 100 UNIT/ML Solostar Pen Inject 22 Units into the skin at bedtime. 12/07/13  Yes Marjan Rabbani, MD  lisinopril (PRINIVIL,ZESTRIL) 40 MG tablet Take 1 tablet (40 mg total) by mouth daily. 05/05/14  Yes Julious Oka, MD  lovastatin (MEVACOR) 20 MG tablet Take 1 tablet (20 mg total) by mouth at bedtime. 05/05/14  Yes Julious Oka, MD  metFORMIN (GLUCOPHAGE) 1000 MG tablet TAKE (1) TABLET BY MOUTH TWICE DAILY WITH MEALS. 05/05/14  Yes Julious Oka, MD  metoprolol (LOPRESSOR) 100 MG tablet Take 1 tablet (100 mg total) by mouth 2 (two) times daily. 05/05/14  Yes Julious Oka, MD  mometasone (NASONEX) 50 MCG/ACT nasal spray Place 2 sprays into the nose 2 (two) times daily. 02/22/14  Yes Cresenciano Genre, MD  ondansetron (ZOFRAN ODT) 4 MG disintegrating tablet 4mg  ODT q4 hours prn nausea/vomit 05/04/14  Yes Maudry Diego, MD  sucralfate (CARAFATE) 1 G tablet Take 1 g by mouth 4 (four) times daily -  with meals and at bedtime.   Yes Historical Provider, MD  ziprasidone (GEODON) 80 MG capsule Take 1 capsule (80 mg total) by mouth 2 (two) times daily with a meal. 03/26/12  Yes Waylan Boga, NP  potassium chloride SA (K-DUR,KLOR-CON) 20 MEQ tablet Take 1 tablet (20 mEq total) by mouth 2 (two) times daily. 11/22/36   Delora Fuel, MD  promethazine (PHENERGAN) 25 MG suppository Place 1 suppository (25 mg total)  rectally every 6 (six) hours as needed for nausea or vomiting. 05/05/14   Maudry Diego, MD  promethazine (PHENERGAN) 25 MG tablet Take 1 tablet (25 mg total) by mouth every 6 (six) hours as needed for nausea. 03/28/11 04/04/11  Fredia Sorrow, MD  promethazine (PHENERGAN) 25 MG tablet Take 1 tablet (25 mg total) by mouth every 6 (six) hours as needed for nausea. 04/06/11 04/13/11  Vivi Ferns, MD    Allergies as of 05/21/2014 - Review Complete 05/21/2014  Allergen Reaction Noted  . Cephalexin  10/14/2013  . Metronidazole Shortness Of Breath and Swelling   . Orange Itching 03/27/2011  . Shrimp [shellfish allergy] Shortness Of Breath and Itching 03/27/2011  . Penicillins Hives and Swelling   . Sulfonamide derivatives Hives   . Glipizide Other (See Comments) 01/15/2011  . Sulfamethoxazole-trimethoprim Rash 02/27/2010    Family History  Problem Relation Age of Onset  . Colon cancer Other   . Hypertension Mother   .  Stroke Father     deceased at age 59  . Heart disease Sister   . Anesthesia problems Neg Hx   . Hypotension Neg Hx   . Malignant hyperthermia Neg Hx   . Pseudochol deficiency Neg Hx     History   Social History  . Marital Status: Single    Spouse Name: N/A    Number of Children: N/A  . Years of Education: 12   Occupational History  . Disability   . UNEMPLOYED    Social History Main Topics  . Smoking status: Current Every Day Smoker -- 0.75 packs/day for 30 years    Types: Cigarettes    Start date: 05/18/2012  . Smokeless tobacco: Never Used     Comment: cutting back,  . Alcohol Use: No     Comment: hx of ETOH abuse  . Drug Use: No  . Sexual Activity: Not on file   Other Topics Concern  . 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 Does not appear at all toxic. Skin:  Intact without significant lesions or rashes. Eyes:  Sclera clear, no icterus.   Conjunctiva pink. Ears:  Normal auditory acuity. Nose:  No deformity, discharge,  or lesions. Mouth:  No deformity or lesions. Neck:  Supple; no masses or thyromegaly. No significant cervical adenopathy. Lungs:  Clear throughout to auscultation.   No wheezes, crackles, or rhonchi. No acute distress. Heart:  Regular rate and rhythm; no murmurs, clicks, rubs,  or gallops. Abdomen: Non-distended, normal bowel sounds.  Soft and nontender without appreciable mass or hepatosplenomegaly.  Pulses:  Normal pulses noted. Extremities:  Without clubbing or edema. Rectal:  No mass in the rectal vault.  Single small anal canal hemorrhoid tag. No stool in the rectal vault. Mucus is Hemoccult negative   Impression:  Pleasant 49 year old lady with multiple medical problems, noting blood in her mouth recently. This is in the setting of NSAID and tobacco use along with significant sinus complaints. She has a mild anemia. She does have iron deficiency. She's had an EGD and colonoscopy fairly recently without significant findings. She has no evidence of occult blood in her stool at this time.  I doubt she has had hematemesis. She may have some bleeding related to sinus issues. In addition, hemoptysis is not excluded.  GERD symptoms overall fairly well controlled. She's had a tendency towards diarrhea over the past couple months. No exposure to antibiotics. She does take metformin which may be a contributing factor.  Blood pressure significantly elevated today.  Recommendations:    Continue Nexium twice daily, Stool  for C Diff  See primary care doctor for a sinus / ENT evaluation and to consider Blood in mouth coming from lungs  Stop smoking  See PCP regarding elevated blood sugar   Take iron every day  OV here in 3 mos    Notice: This dictation was prepared with Dragon dictation along with smaller  Company secretary. Any transcriptional errors that result from this process are unintentional and may not be corrected upon review.

## 2014-05-21 NOTE — Patient Instructions (Addendum)
Continue Nexium twice daily  Stool for C Diff  See primary care doctor for a sinus / ENT evaluation and to consider Blood in mouth coming from lungs  Stop smoking  See PCP regarding elevated blood sugar   Take iron every day  OV here in 3 mos

## 2014-05-24 ENCOUNTER — Ambulatory Visit (INDEPENDENT_AMBULATORY_CARE_PROVIDER_SITE_OTHER): Payer: Medicare Other | Admitting: Internal Medicine

## 2014-05-24 ENCOUNTER — Encounter: Payer: Self-pay | Admitting: Internal Medicine

## 2014-05-24 VITALS — BP 143/79 | HR 84 | Temp 98.1°F | Ht 64.0 in | Wt 209.3 lb

## 2014-05-24 DIAGNOSIS — E119 Type 2 diabetes mellitus without complications: Secondary | ICD-10-CM

## 2014-05-24 DIAGNOSIS — J3489 Other specified disorders of nose and nasal sinuses: Secondary | ICD-10-CM

## 2014-05-24 DIAGNOSIS — F17210 Nicotine dependence, cigarettes, uncomplicated: Secondary | ICD-10-CM

## 2014-05-24 DIAGNOSIS — J019 Acute sinusitis, unspecified: Secondary | ICD-10-CM

## 2014-05-24 DIAGNOSIS — J0110 Acute frontal sinusitis, unspecified: Secondary | ICD-10-CM

## 2014-05-24 LAB — POCT GLYCOSYLATED HEMOGLOBIN (HGB A1C): Hemoglobin A1C: 7.6

## 2014-05-24 MED ORDER — LEVOFLOXACIN 500 MG PO TABS: 500.0000 mg | ORAL_TABLET | Freq: Every day | ORAL | Status: AC

## 2014-05-24 NOTE — Patient Instructions (Addendum)
General Instructions:   Please try to bring all your medicines next time. This will help [name] keep you safe from mistakes.   Progress Toward Treatment Goals:  Treatment Goal 02/22/2014  Hemoglobin A1C at goal  Blood pressure deteriorated  Stop smoking smoking the same amount    Self Care Goals & Plans:  Self Care Goal 05/24/2014  Manage my medications take my medicines as prescribed; bring my medications to every visit; refill my medications on time  Monitor my health -  Eat healthy foods drink diet soda or water instead of juice or soda; eat more vegetables; eat foods that are low in salt; eat baked foods instead of fried foods; eat fruit for snacks and desserts  Be physically active -  Stop smoking -  Meeting treatment goals -    Home Blood Glucose Monitoring 02/22/2014  Check my blood sugar 3 times a day  When to check my blood sugar before meals     Care Management & Community Referrals:  Referral 02/22/2014  Referrals made for care management support none needed  Referrals made to community resources none       Sinusitis Sinusitis is redness, soreness, and inflammation of the paranasal sinuses. Paranasal sinuses are air pockets within the bones of your face (beneath the eyes, the middle of the forehead, or above the eyes). In healthy paranasal sinuses, mucus is able to drain out, and air is able to circulate through them by way of your nose. However, when your paranasal sinuses are inflamed, mucus and air can become trapped. This can allow bacteria and other germs to grow and cause infection. Sinusitis can develop quickly and last only a short time (acute) or continue over a long period (chronic). Sinusitis that lasts for more than 12 weeks is considered chronic.  CAUSES  Causes of sinusitis include:  Allergies.  Structural abnormalities, such as displacement of the cartilage that separates your nostrils (deviated septum), which can decrease the air flow through your  nose and sinuses and affect sinus drainage.  Functional abnormalities, such as when the small hairs (cilia) that line your sinuses and help remove mucus do not work properly or are not present. SIGNS AND SYMPTOMS  Symptoms of acute and chronic sinusitis are the same. The primary symptoms are pain and pressure around the affected sinuses. Other symptoms include:  Upper toothache.  Earache.  Headache.  Bad breath.  Decreased sense of smell and taste.  A cough, which worsens when you are lying flat.  Fatigue.  Fever.  Thick drainage from your nose, which often is green and may contain pus (purulent).  Swelling and warmth over the affected sinuses. DIAGNOSIS  Your health care provider will perform a physical exam. During the exam, your health care provider may:  Look in your nose for signs of abnormal growths in your nostrils (nasal polyps).  Tap over the affected sinus to check for signs of infection.  View the inside of your sinuses (endoscopy) using an imaging device that has a light attached (endoscope). If your health care provider suspects that you have chronic sinusitis, one or more of the following tests may be recommended:  Allergy tests.  Nasal culture. A sample of mucus is taken from your nose, sent to a lab, and screened for bacteria.  Nasal cytology. A sample of mucus is taken from your nose and examined by your health care provider to determine if your sinusitis is related to an allergy. TREATMENT  Most cases of acute sinusitis are  related to a viral infection and will resolve on their own within 10 days. Sometimes medicines are prescribed to help relieve symptoms (pain medicine, decongestants, nasal steroid sprays, or saline sprays).  However, for sinusitis related to a bacterial infection, your health care provider will prescribe antibiotic medicines. These are medicines that will help kill the bacteria causing the infection.  Rarely, sinusitis is caused by a  fungal infection. In theses cases, your health care provider will prescribe antifungal medicine. For some cases of chronic sinusitis, surgery is needed. Generally, these are cases in which sinusitis recurs more than 3 times per year, despite other treatments. HOME CARE INSTRUCTIONS   Drink plenty of water. Water helps thin the mucus so your sinuses can drain more easily.  Use a humidifier.  Inhale steam 3 to 4 times a day (for example, sit in the bathroom with the shower running).  Apply a warm, moist washcloth to your face 3 to 4 times a day, or as directed by your health care provider.  Use saline nasal sprays to help moisten and clean your sinuses.  Take medicines only as directed by your health care provider.  If you were prescribed either an antibiotic or antifungal medicine, finish it all even if you start to feel better. SEEK IMMEDIATE MEDICAL CARE IF:  You have increasing pain or severe headaches.  You have nausea, vomiting, or drowsiness.  You have swelling around your face.  You have vision problems.  You have a stiff neck.  You have difficulty breathing. MAKE SURE YOU:   Understand these instructions.  Will watch your condition.  Will get help right away if you are not doing well or get worse. Document Released: 04/02/2005 Document Revised: 08/17/2013 Document Reviewed: 04/17/2011 [facility] Patient Information 2015 [place], [place]. This information is not intended to replace advice given to you by your health care provider. Make sure you discuss any questions you have with your health care provider.

## 2014-05-24 NOTE — Assessment & Plan Note (Signed)
Patient seems to have classic findings of sinusitis and ongoing symptoms for at least 2-3 months. Has not had any other antibiotic treatment up until this point. -Patient has penicillin allergy therefore will give levofloxacin 500 mg daily for 5 days -ENT referral

## 2014-05-24 NOTE — Progress Notes (Signed)
Case discussed with Dr. Sadek at the time of the visit.  We reviewed the resident's history and exam and pertinent patient test results.  I agree with the assessment, diagnosis, and plan of care documented in the resident's note. 

## 2014-05-24 NOTE — Progress Notes (Signed)
Subjective:   Patient ID: [name] female   DOB: 02/05/1966 49 y.o.   MRN: [id number]  HPI: [name] is a 49 y.o. woman pmh as listed below presenting for ongoing sinus pain.  The patient states that she's had ongoing frontal sinus pain for about 2-3 months. She's had some intermittent fevers and chills during this time period as well. She has had some postnasal drip and has now developed into a harsh cough. She has tried over the counter pain medications with little to no relief. She states that she's had a history of sinus problems including a very strong family history of multiple members having sinus conditions. She has not had any antibiotic treatment for sinusitis up until this point. She has also had some nausea and anorexia.   Past Medical History  Diagnosis Date  . Arteriosclerotic cardiovascular disease (ASCVD)     Minimal at cath in [facility].stress nuclear study in 8/08 with nl EF; neg stress echo in 2010  . Diabetes mellitus, type 2 2000    Onset in 2000; no insulin  . Hyperlipidemia   . Hypertension `    during treatment with Geodon  . Gastroesophageal reflux disease     Schatzki's ring  . Anemia, iron deficiency   . Alcohol abuse   . Depression   . Community acquired pneumonia 01/03/10, 05/2010, 04/2012    2011; with pleural effusion-[facility] acute resp failure; intubated in Jan 2014 (HMPV pneumonia)  . Obesity   . Schizoaffective disorder     requiring multiple psychiatric admissions  . Dysphagia   . Diastolic dysfunction     grade 2 per echo 2011  . Pulmonary hypertension 05/02/2012    Patient needs repeat echo in 06/2012   . History of alcohol abuse 07/22/2007    Qualifier: Diagnosis of  By: Lenn Cal     Current Outpatient Prescriptions  Medication Sig Dispense Refill  . acetaminophen (TYLENOL) 500 MG tablet Take 1,000 mg by mouth every 6 (six) hours as needed for mild pain.    Marland Kitchen albuterol (PROVENTIL HFA;VENTOLIN HFA) 108  (90 BASE) MCG/ACT inhaler Inhale 2 puffs into the lungs 3 (three) times daily as needed for wheezing or shortness of breath. 1 Inhaler 2  . albuterol (PROVENTIL) (2.5 MG/3ML) 0.083% nebulizer solution Take 3 mLs (2.5 mg total) by nebulization every 6 (six) hours as needed for wheezing or shortness of breath. 75 mL 12  . esomeprazole (NEXIUM) 40 MG capsule Take 1 capsule (40 mg total) by mouth 2 (two) times daily before a meal. 60 capsule 2  . ferrous sulfate 325 (65 FE) MG tablet Take 1 tablet (325 mg total) by mouth 3 (three) times daily with meals. 90 tablet 3  . furosemide (LASIX) 20 MG tablet TAKE 1 TABLET BY MOUTH ONCE DAILY. 30 tablet 6  . ibuprofen (ADVIL,MOTRIN) 200 MG tablet Take 400 mg by mouth every 6 (six) hours as needed for mild pain.    . Insulin Glargine (LANTUS SOLOSTAR) 100 UNIT/ML Solostar Pen Inject 22 Units into the skin at bedtime. 15 mL 12  . levofloxacin (LEVAQUIN) 500 MG tablet Take 1 tablet (500 mg total) by mouth daily. 10 tablet 0  . lisinopril (PRINIVIL,ZESTRIL) 40 MG tablet Take 1 tablet (40 mg total) by mouth daily. 30 tablet 4  . lovastatin (MEVACOR) 20 MG tablet Take 1 tablet (20 mg total) by mouth at bedtime. 90 tablet 3  . metFORMIN (GLUCOPHAGE) 1000 MG tablet TAKE (1) TABLET  BY MOUTH TWICE DAILY WITH MEALS. 60 tablet 6  . metoprolol (LOPRESSOR) 100 MG tablet Take 1 tablet (100 mg total) by mouth 2 (two) times daily. 60 tablet 6  . mometasone (NASONEX) 50 MCG/ACT nasal spray Place 2 sprays into the nose 2 (two) times daily. 17 g 12  . ondansetron (ZOFRAN ODT) 4 MG disintegrating tablet 4mg  ODT q4 hours prn nausea/vomit 12 tablet 0  . potassium chloride SA (K-DUR,KLOR-CON) 20 MEQ tablet Take 1 tablet (20 mEq total) by mouth 2 (two) times daily. 60 tablet 0  . promethazine (PHENERGAN) 25 MG suppository Place 1 suppository (25 mg total) rectally every 6 (six) hours as needed for nausea or vomiting. 12 each 0  . promethazine (PHENERGAN) 25 MG tablet Take 1 tablet (25 mg  total) by mouth every 6 (six) hours as needed for nausea. 12 tablet 0  . promethazine (PHENERGAN) 25 MG tablet Take 1 tablet (25 mg total) by mouth every 6 (six) hours as needed for nausea. 30 tablet 0  . sucralfate (CARAFATE) 1 G tablet Take 1 g by mouth 4 (four) times daily -  with meals and at bedtime.    . ziprasidone (GEODON) 80 MG capsule Take 1 capsule (80 mg total) by mouth 2 (two) times daily with a meal. 60 capsule 0  . [DISCONTINUED] potassium chloride (K-DUR) 10 MEQ tablet Take 1 tablet (10 mEq total) by mouth daily. 30 tablet 3   No current facility-administered medications for this visit.   Facility-Administered Medications Ordered in Other Visits  Medication Dose Route Frequency Provider Last Rate Last Dose  . 0.9 %  sodium chloride infusion   Intravenous Once Merryl Hacker, MD      . ondansetron [facility]) injection 4 mg  4 mg Intramuscular Once Merryl Hacker, MD      . pantoprazole (PROTONIX) injection 40 mg  40 mg Intravenous Once Merryl Hacker, MD       Family History  Problem Relation Age of Onset  . Colon cancer Other   . Hypertension Mother   . Stroke Father     deceased at age 62  . Heart disease Sister   . Anesthesia problems Neg Hx   . Hypotension Neg Hx   . Malignant hyperthermia Neg Hx   . Pseudochol deficiency Neg Hx    History   Social History  . Marital Status: Single    Spouse Name: N/A    Number of Children: N/A  . Years of Education: 12   Occupational History  . Disability   . UNEMPLOYED    Social History Main Topics  . Smoking status: Current Every Day Smoker -- 0.75 packs/day for 30 years    Types: Cigarettes    Start date: 05/18/2012  . Smokeless tobacco: Never Used     Comment: cutting back,  . Alcohol Use: No     Comment: hx of ETOH abuse  . Drug Use: No  . Sexual Activity: None   Other Topics Concern  . 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 Objective:  Physical Exam: Filed Vitals:   05/24/14 1432  BP: 143/79  Pulse: 84  Temp: 98.1 F (36.7 C)  TempSrc: Oral  Height:  5\' 4"  (1.626 m)  Weight: 209 lb 4.8 oz (94.938 kg)  SpO2: 100%   General: sitting in chair, slightly uncomfortable HEENT: PERRL, EOMI, no scleral icterus, frontal and maxillary sinus pain to palpation, yellowish PND, MMM, TMs pearly gray bilaterally no exudate or erythema Cardiac: RRR, no rubs, murmurs or gallops Pulm: clear to auscultation bilaterally, moving normal volumes of air Abd: soft, nontender, nondistended, BS present Ext: warm and well perfused, no pedal edema Neuro: alert and oriented X3, cranial nerves II-XII grossly intact  Assessment & Plan:  Please see problem oriented charting  Pt discussed with Dr. Ellwood Dense

## 2014-05-25 LAB — GLUCOSE, CAPILLARY: Glucose-Capillary: 279 mg/dL — ABNORMAL HIGH (ref 70–99)

## 2014-05-26 ENCOUNTER — Ambulatory Visit: Payer: Self-pay | Admitting: Internal Medicine

## 2014-05-26 LAB — CLOSTRIDIUM DIFFICILE BY PCR: Toxigenic C. Difficile by PCR: NOT DETECTED

## 2014-06-10 ENCOUNTER — Telehealth: Payer: Self-pay | Admitting: *Deleted

## 2014-06-10 ENCOUNTER — Encounter (HOSPITAL_COMMUNITY): Payer: Self-pay | Admitting: Emergency Medicine

## 2014-06-10 ENCOUNTER — Emergency Department (HOSPITAL_COMMUNITY): Payer: Medicare Other

## 2014-06-10 ENCOUNTER — Emergency Department (HOSPITAL_COMMUNITY)
Admission: EM | Admit: 2014-06-10 | Discharge: 2014-06-10 | Disposition: A | Discharge status: Home / Self Care | Payer: Medicare Other | Attending: Emergency Medicine | Admitting: Emergency Medicine

## 2014-06-10 DIAGNOSIS — E669 Obesity, unspecified: Secondary | ICD-10-CM | POA: Diagnosis not present

## 2014-06-10 DIAGNOSIS — R079 Chest pain, unspecified: Secondary | ICD-10-CM | POA: Insufficient documentation

## 2014-06-10 DIAGNOSIS — K219 Gastro-esophageal reflux disease without esophagitis: Secondary | ICD-10-CM | POA: Diagnosis not present

## 2014-06-10 DIAGNOSIS — Z8709 Personal history of other diseases of the respiratory system: Secondary | ICD-10-CM | POA: Diagnosis not present

## 2014-06-10 DIAGNOSIS — E119 Type 2 diabetes mellitus without complications: Secondary | ICD-10-CM | POA: Diagnosis not present

## 2014-06-10 DIAGNOSIS — D649 Anemia, unspecified: Secondary | ICD-10-CM

## 2014-06-10 DIAGNOSIS — N920 Excessive and frequent menstruation with regular cycle: Secondary | ICD-10-CM | POA: Diagnosis not present

## 2014-06-10 DIAGNOSIS — Z720 Tobacco use: Secondary | ICD-10-CM | POA: Insufficient documentation

## 2014-06-10 DIAGNOSIS — Z79899 Other long term (current) drug therapy: Secondary | ICD-10-CM | POA: Diagnosis not present

## 2014-06-10 DIAGNOSIS — I10 Essential (primary) hypertension: Secondary | ICD-10-CM | POA: Diagnosis not present

## 2014-06-10 DIAGNOSIS — F259 Schizoaffective disorder, unspecified: Secondary | ICD-10-CM | POA: Insufficient documentation

## 2014-06-10 DIAGNOSIS — I2510 Atherosclerotic heart disease of native coronary artery without angina pectoris: Secondary | ICD-10-CM | POA: Diagnosis not present

## 2014-06-10 DIAGNOSIS — E785 Hyperlipidemia, unspecified: Secondary | ICD-10-CM | POA: Insufficient documentation

## 2014-06-10 DIAGNOSIS — Z794 Long term (current) use of insulin: Secondary | ICD-10-CM | POA: Insufficient documentation

## 2014-06-10 DIAGNOSIS — R42 Dizziness and giddiness: Secondary | ICD-10-CM | POA: Diagnosis not present

## 2014-06-10 DIAGNOSIS — Z880 Allergy status to penicillin: Secondary | ICD-10-CM | POA: Diagnosis not present

## 2014-06-10 DIAGNOSIS — N921 Excessive and frequent menstruation with irregular cycle: Secondary | ICD-10-CM | POA: Insufficient documentation

## 2014-06-10 DIAGNOSIS — Z7952 Long term (current) use of systemic steroids: Secondary | ICD-10-CM | POA: Diagnosis not present

## 2014-06-10 LAB — COMPREHENSIVE METABOLIC PANEL
ALT: 11 U/L (ref 0–35)
AST: 14 U/L (ref 0–37)
Albumin: 3.4 g/dL — ABNORMAL LOW (ref 3.5–5.2)
Alkaline Phosphatase: 53 U/L (ref 39–117)
Anion gap: 1 — ABNORMAL LOW (ref 5–15)
BUN: 9 mg/dL (ref 6–23)
CO2: 24 mmol/L (ref 19–32)
Calcium: 8.4 mg/dL (ref 8.4–10.5)
Chloride: 109 mmol/L (ref 96–112)
Creatinine, Ser: 0.74 mg/dL (ref 0.50–1.10)
GFR calc Af Amer: >90 mL/min (ref >90)
GFR calc non Af Amer: >90 mL/min (ref >90)
Glucose, Bld: 131 mg/dL — ABNORMAL HIGH (ref 70–99)
Potassium: 3.6 mmol/L (ref 3.5–5.1)
Sodium: 134 mmol/L — ABNORMAL LOW (ref 135–145)
Total Bilirubin: 0.3 mg/dL (ref 0.3–1.2)
Total Protein: 6.2 g/dL (ref 6.0–8.3)

## 2014-06-10 LAB — CBC WITH DIFFERENTIAL/PLATELET
Basophils Absolute: 0 10*3/uL (ref 0.0–0.1)
Basophils Relative: 0 % (ref 0–1)
Eosinophils Absolute: 0.1 10*3/uL (ref 0.0–0.7)
Eosinophils Relative: 2 % (ref 0–5)
HCT: 33.6 % — ABNORMAL LOW (ref 36.0–46.0)
Hemoglobin: 11 g/dL — ABNORMAL LOW (ref 12.0–15.0)
Lymphocytes Relative: 44 % (ref 12–46)
Lymphs Abs: 3.3 10*3/uL (ref 0.7–4.0)
MCH: 26.6 pg (ref 26.0–34.0)
MCHC: 32.7 g/dL (ref 30.0–36.0)
MCV: 81.2 fL (ref 78.0–100.0)
Monocytes Absolute: 0.4 10*3/uL (ref 0.1–1.0)
Monocytes Relative: 5 % (ref 3–12)
Neutro Abs: 3.8 10*3/uL (ref 1.7–7.7)
Neutrophils Relative %: 49 % (ref 43–77)
Platelets: 203 10*3/uL (ref 150–400)
RBC: 4.14 MIL/uL (ref 3.87–5.11)
RDW: 18.8 % — ABNORMAL HIGH (ref 11.5–15.5)
WBC: 7.6 10*3/uL (ref 4.0–10.5)

## 2014-06-10 LAB — BRAIN NATRIURETIC PEPTIDE: B Natriuretic Peptide: 34 pg/mL (ref 0.0–100.0)

## 2014-06-10 LAB — TROPONIN I: Troponin I: <0.03 ng/mL (ref <0.031)

## 2014-06-10 MED ORDER — MECLIZINE HCL 50 MG PO TABS: 50.0000 mg | ORAL_TABLET | Freq: Three times a day (TID) | ORAL | Status: DC | PRN

## 2014-06-10 MED ORDER — SODIUM CHLORIDE 0.9 % IV BOLUS (SEPSIS)
1000.0000 mL | Freq: Once | INTRAVENOUS | Status: AC
  Administered 2014-06-10: 1000 mL via INTRAVENOUS

## 2014-06-10 MED ORDER — GI COCKTAIL ~~LOC~~
30.0000 mL | Freq: Once | ORAL | Status: AC
  Administered 2014-06-10: 30 mL via ORAL
  Filled 2014-06-10: qty 30

## 2014-06-10 NOTE — Discharge Instructions (Signed)
Abnormal Uterine Bleeding Abnormal uterine bleeding can affect women at various stages in life, including teenagers, women in their reproductive years, pregnant women, and women who have reached menopause. Several kinds of uterine bleeding are considered abnormal, including:  Bleeding or spotting between periods.   Bleeding after sexual intercourse.   Bleeding that is heavier or more than normal.   Periods that last longer than usual.  Bleeding after menopause.  Many cases of abnormal uterine bleeding are minor and simple to treat, while others are more serious. Any type of abnormal bleeding should be evaluated by your health care provider. Treatment will depend on the cause of the bleeding. HOME CARE INSTRUCTIONS Monitor your condition for any changes. The following actions may help to alleviate any discomfort you are experiencing:  Avoid the use of tampons and douches as directed by your health care provider.  Change your pads frequently. You should get regular pelvic exams and Pap tests. Keep all follow-up appointments for diagnostic tests as directed by your health care provider.  SEEK MEDICAL CARE IF:   Your bleeding lasts more than 1 week.   You feel dizzy at times.  SEEK IMMEDIATE MEDICAL CARE IF:   You pass out.   You are changing pads every 15 to 30 minutes.   You have abdominal pain.  You have a fever.   You become sweaty or weak.   You are passing large blood clots from the vagina.   You start to feel nauseous and vomit. MAKE SURE YOU:   Understand these instructions.  Will watch your condition.  Will get help right away if you are not doing well or get worse. Document Released: 04/02/2005 Document Revised: 04/07/2013 Document Reviewed: 10/30/2012 [facility] Patient Information 2015 [place], [place]. This information is not intended to replace advice given to you by your health care provider. Make sure you discuss any questions you have with your  health care provider.  Anemia, Nonspecific Anemia is a condition in which the concentration of red blood cells or hemoglobin in the blood is below normal. Hemoglobin is a substance in red blood cells that carries oxygen to the tissues of the body. Anemia results in not enough oxygen reaching these tissues.  CAUSES  Common causes of anemia include:   Excessive bleeding. Bleeding may be internal or external. This includes excessive bleeding from periods (in women) or from the intestine.   Poor nutrition.   Chronic kidney, thyroid, and liver disease.  Bone marrow disorders that decrease red blood cell production.  Cancer and treatments for cancer.  HIV, AIDS, and their treatments.  Spleen problems that increase red blood cell destruction.  Blood disorders.  Excess destruction of red blood cells due to infection, medicines, and autoimmune disorders. SIGNS AND SYMPTOMS   Minor weakness.   Dizziness.   Headache.  Palpitations.   Shortness of breath, especially with exercise.   Paleness.  Cold sensitivity.  Indigestion.  Nausea.  Difficulty sleeping.  Difficulty concentrating. Symptoms may occur suddenly or they may develop slowly.  DIAGNOSIS  Additional blood tests are often needed. These help your health care provider determine the best treatment. Your health care provider will check your stool for blood and look for other causes of blood loss.  TREATMENT  Treatment varies depending on the cause of the anemia. Treatment can include:   Supplements of iron, vitamin I94, or folic acid.   Hormone medicines.   A blood transfusion. This may be needed if blood loss is severe.   Hospitalization.  This may be needed if there is significant continual blood loss.   Dietary changes.  Spleen removal. HOME CARE INSTRUCTIONS Keep all follow-up appointments. It often takes many weeks to correct anemia, and having your health care provider check on your condition  and your response to treatment is very important. SEEK IMMEDIATE MEDICAL CARE IF:   You develop extreme weakness, shortness of breath, or chest pain.   You become dizzy or have trouble concentrating.  You develop heavy vaginal bleeding.   You develop a rash.   You have bloody or black, tarry stools.   You faint.   You vomit up blood.   You vomit repeatedly.   You have abdominal pain.  You have a fever or persistent symptoms for more than 2-3 days.   You have a fever and your symptoms suddenly get worse.   You are dehydrated.  MAKE SURE YOU:  Understand these instructions.  Will watch your condition.  Will get help right away if you are not doing well or get worse. Document Released: 05/10/2004 Document Revised: 12/03/2012 Document Reviewed: 09/26/2012 [facility] Patient Information 2015 [place], [place]. This information is not intended to replace advice given to you by your health care provider. Make sure you discuss any questions you have with your health care provider.  Benign Positional Vertigo Vertigo means you feel like you or your surroundings are moving when they are not. Benign positional vertigo is the most common form of vertigo. Benign means that the cause of your condition is not serious. Benign positional vertigo is more common in older adults. CAUSES  Benign positional vertigo is the result of an upset in the labyrinth system. This is an area in the middle ear that helps control your balance. This may be caused by a viral infection, head injury, or repetitive motion. However, often no specific cause is found. SYMPTOMS  Symptoms of benign positional vertigo occur when you move your head or eyes in different directions. Some of the symptoms may include:  Loss of balance and falls.  Vomiting.  Blurred vision.  Dizziness.  Nausea.  Involuntary eye movements (nystagmus). DIAGNOSIS  Benign positional vertigo is usually diagnosed by physical exam.  If the specific cause of your benign positional vertigo is unknown, your caregiver may perform imaging tests, such as magnetic resonance imaging (MRI) or computed tomography (CT). TREATMENT  Your caregiver may recommend movements or procedures to correct the benign positional vertigo. Medicines such as meclizine, benzodiazepines, and medicines for nausea may be used to treat your symptoms. In rare cases, if your symptoms are caused by certain conditions that affect the inner ear, you may need surgery. HOME CARE INSTRUCTIONS   Follow your caregiver's instructions.  Move slowly. Do not make sudden body or head movements.  Avoid driving.  Avoid operating heavy machinery.  Avoid performing any tasks that would be dangerous to you or others during a vertigo episode.  Drink enough fluids to keep your urine clear or pale yellow. SEEK IMMEDIATE MEDICAL CARE IF:   You develop problems with walking, weakness, numbness, or using your arms, hands, or legs.  You have difficulty speaking.  You develop severe headaches.  Your nausea or vomiting continues or gets worse.  You develop visual changes.  Your family or friends notice any behavioral changes.  Your condition gets worse.  You have a fever.  You develop a stiff neck or sensitivity to light. MAKE SURE YOU:   Understand these instructions.  Will watch your condition.  Will get  help right away if you are not doing well or get worse. Document Released: 01/08/2006 Document Revised: 06/25/2011 Document Reviewed: 12/21/2010 [facility] Patient Information 2015 [employer], [place]. This information is not intended to replace advice given to you by your health care provider. Make sure you discuss any questions you have with your health care provider.

## 2014-06-10 NOTE — ED Provider Notes (Signed)
CSN: [id number]     Arrival date & time 06/10/14  1724 History   First MD Initiated Contact with Patient 06/10/14 1744     Chief Complaint  Patient presents with  . Chest Pain     (Consider location/radiation/quality/duration/timing/severity/associated sxs/prior Treatment) Patient is a 49 y.o. female presenting with chest pain. The history is provided by the patient.  Chest Pain  [name] is a 49 y.o. female  is here for evaluation of a burning pain in her chest, which has been present on and off for 2 days.  She also complains of heaviness in her arms.  She is currently on her menses, bleeding heavily for 6 days.  She missed her last period.  Her periods have been more irregular, recently.  She is not concerned about a possible pregnancy.  She denies fever, chills, cough, shortness of breath, nausea or vomiting.  She has occasional episodes of dizziness, when she moves her head.  She is taking her usual medications.  She's had similar pain in the past.  There are no other known modifying factors.  Past Medical History  Diagnosis Date  . Arteriosclerotic cardiovascular disease (ASCVD)     Minimal at cath in [facility].stress nuclear study in 8/08 with nl EF; neg stress echo in 2010  . Diabetes mellitus, type 2 2000    Onset in 2000; no insulin  . Hyperlipidemia   . Hypertension `    during treatment with Geodon  . Gastroesophageal reflux disease     Schatzki's ring  . Anemia, iron deficiency   . Alcohol abuse   . Depression   . Community acquired pneumonia 01/03/10, 05/2010, 04/2012    2011; with pleural effusion-[facility] acute resp failure; intubated in Jan 2014 (HMPV pneumonia)  . Obesity   . Schizoaffective disorder     requiring multiple psychiatric admissions  . Dysphagia   . Diastolic dysfunction     grade 2 per echo 2011  . Pulmonary hypertension 05/02/2012    Patient needs repeat echo in 06/2012   . History of alcohol abuse 07/22/2007    Qualifier:  Diagnosis of  By: Lenn Cal     Past Surgical History  Procedure Laterality Date  . Dilation and curettage, diagnostic / therapeutic  1992  . Esophagogastroduodenoscopy  09/16/08    Dr. Trevor Iha hiatal hernia/excoriations involving the cardia and mucosa consistent with trauma, antral erosions  of linear petechiae ? gastritis versus early gastric antral vascular  ectasia.Marland Kitchen biopsy showed reactive gastropathy. No H. pylori.  . Esophagogastroduodenoscopy  09/2007    Dr. Evalee Mutton ring, dilated to 98 French Maloney dilator, small hiatal hernia, antral erosions, biopsies reactive gastropathy.  Azzie Almas dilation  07/17/2011    [facility] sedation-->distal esophageal stricture s/p dilation, chronic gastritis, multiple ulcers in stomach. no h.pylori  . Colonoscopy  01/2006    internal hemorrhoids  . Colonoscopy  01/10/2012    Dr. Rourk:Single anal canal hemorrhoidal tag likely source of  trivial hematochezia; right-sided colonic diverticulosis  . Esophagogastroduodenoscopy (egd) with propofol N/A 12/17/2013    RXV:QMGQQPY antral erosions and petechiae. Small hiatal hernia. No endoscopic explanation for patient's symptoms   Family History  Problem Relation Age of Onset  . Colon cancer Other   . Hypertension Mother   . Stroke Father     deceased at age 69  . Heart disease Sister   . Anesthesia problems Neg Hx   . Hypotension Neg Hx   . Malignant hyperthermia Neg Hx   .  Pseudochol deficiency Neg Hx    History  Substance Use Topics  . Smoking status: Current Every Day Smoker -- 0.75 packs/day for 30 years    Types: Cigarettes    Start date: 05/18/2012  . Smokeless tobacco: Never Used     Comment: cutting back,  . Alcohol Use: No     Comment: hx of ETOH abuse   OB History    Gravida Para Term Preterm AB TAB SAB Ectopic Multiple Living   3 2 2  1 1    2      Review of Systems  Cardiovascular: Positive for chest pain.  All other systems reviewed and are  negative.     Allergies  Cephalexin; Metronidazole; Orange; Shrimp; Penicillins; Sulfonamide derivatives; Glipizide; and Sulfamethoxazole-trimethoprim  Home Medications   Prior to Admission medications   Medication Sig Start Date End Date Taking? Authorizing Provider  acetaminophen (TYLENOL) 500 MG tablet Take 1,000 mg by mouth every 6 (six) hours as needed for mild pain.    Historical Provider, MD  albuterol (PROVENTIL HFA;VENTOLIN HFA) 108 (90 BASE) MCG/ACT inhaler Inhale 2 puffs into the lungs 3 (three) times daily as needed for wheezing or shortness of breath. 10/27/12   Rigoberto Noel, MD  albuterol (PROVENTIL) (2.5 MG/3ML) 0.083% nebulizer solution Take 3 mLs (2.5 mg total) by nebulization every 6 (six) hours as needed for wheezing or shortness of breath. 03/17/14   Otho Bellows, MD  esomeprazole (NEXIUM) 40 MG capsule Take 1 capsule (40 mg total) by mouth 2 (two) times daily before a meal. 05/05/14   Julious Oka, MD  ferrous sulfate 325 (65 FE) MG tablet Take 1 tablet (325 mg total) by mouth 3 (three) times daily with meals. 05/05/14 05/05/15  Julious Oka, MD  furosemide (LASIX) 20 MG tablet TAKE 1 TABLET BY MOUTH ONCE DAILY. 05/05/14   Julious Oka, MD  ibuprofen (ADVIL,MOTRIN) 200 MG tablet Take 400 mg by mouth every 6 (six) hours as needed for mild pain.    Historical Provider, MD  Insulin Glargine (LANTUS SOLOSTAR) 100 UNIT/ML Solostar Pen Inject 22 Units into the skin at bedtime. 12/07/13   Juluis Mire, MD  lisinopril (PRINIVIL,ZESTRIL) 40 MG tablet Take 1 tablet (40 mg total) by mouth daily. 05/05/14   Julious Oka, MD  lovastatin (MEVACOR) 20 MG tablet Take 1 tablet (20 mg total) by mouth at bedtime. 05/05/14   Julious Oka, MD  meclizine (ANTIVERT) 50 MG tablet Take 1 tablet (50 mg total) by mouth 3 (three) times daily as needed for dizziness. 06/10/14   Richarda Blade, MD  metFORMIN (GLUCOPHAGE) 1000 MG tablet TAKE (1) TABLET BY MOUTH TWICE DAILY WITH MEALS. 05/05/14   Julious Oka, MD  metoprolol (LOPRESSOR) 100 MG tablet Take 1 tablet (100 mg total) by mouth 2 (two) times daily. 05/05/14   Julious Oka, MD  mometasone (NASONEX) 50 MCG/ACT nasal spray Place 2 sprays into the nose 2 (two) times daily. 02/22/14   Cresenciano Genre, MD  ondansetron (ZOFRAN ODT) 4 MG disintegrating tablet 4mg  ODT q4 hours prn nausea/vomit 05/04/14   Maudry Diego, MD  potassium chloride SA (K-DUR,KLOR-CON) 20 MEQ tablet Take 1 tablet (20 mEq total) by mouth 2 (two) times daily. 04/20/95   Delora Fuel, MD  promethazine (PHENERGAN) 25 MG suppository Place 1 suppository (25 mg total) rectally every 6 (six) hours as needed for nausea or vomiting. 05/05/14   Maudry Diego, MD  promethazine (PHENERGAN) 25 MG tablet Take 1 tablet (25 mg  total) by mouth every 6 (six) hours as needed for nausea. 03/28/11 04/04/11  Fredia Sorrow, MD  promethazine (PHENERGAN) 25 MG tablet Take 1 tablet (25 mg total) by mouth every 6 (six) hours as needed for nausea. 04/06/11 04/13/11  Vivi Ferns, MD  sucralfate (CARAFATE) 1 G tablet Take 1 g by mouth 4 (four) times daily -  with meals and at bedtime.    Historical Provider, MD  ziprasidone (GEODON) 80 MG capsule Take 1 capsule (80 mg total) by mouth 2 (two) times daily with a meal. 03/26/12   Waylan Boga, NP   BP 154/88 mmHg  Pulse 66  Temp(Src) 98.8 F (37.1 C) (Oral)  Resp 27  Ht 5\' 4"  (1.626 m)  Wt 205 lb (92.987 kg)  BMI 35.17 kg/m2  SpO2 100%  LMP 06/04/2014 Physical Exam  Constitutional: She is oriented to person, place, and time. She appears well-developed and well-nourished.  HENT:  Head: Normocephalic and atraumatic.  Right Ear: External ear normal.  Left Ear: External ear normal.  Eyes: Conjunctivae and EOM are normal. Pupils are equal, round, and reactive to light.  Neck: Normal range of motion and phonation normal. Neck supple.  Cardiovascular: Normal rate, regular rhythm and normal heart sounds.   Pulmonary/Chest: Effort normal and breath  sounds normal. She exhibits no bony tenderness.  Abdominal: Soft. She exhibits no distension. There is no tenderness. There is no rebound.  Musculoskeletal: Normal range of motion.  Neurological: She is alert and oriented to person, place, and time. No cranial nerve deficit or sensory deficit. She exhibits normal muscle tone. Coordination normal.  No dysarthria and aphasia or nystagmus  Skin: Skin is warm, dry and intact.  Psychiatric: She has a normal mood and affect. Her behavior is normal. Judgment and thought content normal.  Nursing note and vitals reviewed.   ED Course  Procedures (including critical care time)  Medications  sodium chloride 0.9 % bolus 1,000 mL (1,000 mLs Intravenous New Bag/Given 06/10/14 1837)  gi cocktail (Maalox,Lidocaine,Donnatal) (30 mLs Oral Given 06/10/14 1837)    Patient Vitals for the past 24 hrs:  BP Temp Temp src Pulse Resp SpO2 Height Weight  06/10/14 1900 154/88 mmHg - - 66 (!) 27 100 % - -  06/10/14 1830 (!) 155/109 mmHg - - 70 14 99 % - -  06/10/14 1808 137/95 mmHg - - 75 18 100 % - -  06/10/14 1731 154/86 mmHg 98.8 F (37.1 C) Oral 75 18 100 % 5\' 4"  (1.626 m) 205 lb (92.987 kg)    8:05 PM Reevaluation with update and discussion. After initial assessment and treatment, an updated evaluation reveals she feels somewhat better after GI cocktail.  Findings discussed with patient, labs reviewed.  Repeat blood pressure, normal.  All questions answered. Nusaiba Guallpa L    Labs Review Labs Reviewed  CBC WITH DIFFERENTIAL/PLATELET - Abnormal; Notable for the following:    Hemoglobin 11.0 (*)    HCT 33.6 (*)    RDW 18.8 (*)    All other components within normal limits  COMPREHENSIVE METABOLIC PANEL - Abnormal; Notable for the following:    Sodium 134 (*)    Glucose, Bld 131 (*)    Albumin 3.4 (*)    Anion gap 1 (*)    All other components within normal limits  TROPONIN I  BRAIN NATRIURETIC PEPTIDE    Imaging Review Dg Chest 2  View  06/10/2014   CLINICAL DATA:  Chest pain with radiation down both arms  EXAM:  CHEST  2 VIEW  COMPARISON:  05/20/2014  FINDINGS: The heart size and mediastinal contours are within normal limits. Both lungs are clear. Minimal scarring is noted in the left mid lung. The visualized skeletal structures are unremarkable.  IMPRESSION: No active cardiopulmonary disease.   Electronically Signed   By: Inez Catalina M.D.   On: 06/10/2014 19:03     EKG Interpretation   Date/Time:  Thursday June 10 2014 17:41:39 EST Ventricular Rate:  76 PR Interval:  116 QRS Duration: 76 QT Interval:  401 QTC Calculation: 451 R Axis:   66 Text Interpretation:  Sinus rhythm Borderline short PR interval since last  tracing no significant change Confirmed by Eulis Foster  MD, Darek Eifler [phone]) on  06/10/2014 7:56:30 PM      MDM   Final diagnoses:  Nonspecific chest pain  Anemia, unspecified anemia type  Menorrhagia with irregular cycle  Vertigo    Nonspecific chest pain, dizziness and arm heaviness.  Mild anemia, with heavy menses, likely early signs of menopause.  Doubt CVA, ACS, PE or pneumonia.   Nursing Notes Reviewed/ Care Coordinated Applicable Imaging Reviewed Interpretation of Laboratory Data incorporated into ED treatment  The patient appears reasonably screened and/or stabilized for discharge and I doubt any other medical condition or other [facility] requiring further screening, evaluation, or treatment in the ED at this time prior to discharge.  Plan: Home Medications- Meclizine; Home Treatments- rest, fluids; return here if the recommended treatment, does not improve the symptoms; Recommended follow up- PCP 1 week for check up    Richarda Blade, MD 06/10/14 2008

## 2014-06-10 NOTE — Telephone Encounter (Signed)
Pt calls and states she has been having " dizzy spells" and her mens. Period has been going on 6 days today. She would like labs called into an independent lab "across from [employer]", she doesn't know the name of it,  because she does not want to go to the ED and it is too farfor her to come to clinic. She is advised to go to ED, not driving but to have someone to take her or call 911, she is agreeable

## 2014-06-10 NOTE — ED Notes (Signed)
Pt feels a burning in chest radiating down both arms, nausea and dizziness, pt states missed 2 periods, now heavy bleeding x 6 days

## 2014-06-17 ENCOUNTER — Telehealth: Payer: Self-pay | Admitting: *Deleted

## 2014-06-17 NOTE — Telephone Encounter (Signed)
Pt calls and requests a refill of claritin, it is not presently on her med list

## 2014-06-22 ENCOUNTER — Telehealth: Payer: Self-pay | Admitting: Internal Medicine

## 2014-06-22 DIAGNOSIS — F319 Bipolar disorder, unspecified: Secondary | ICD-10-CM | POA: Diagnosis not present

## 2014-06-22 NOTE — Telephone Encounter (Signed)
Call to patient to confirm appointment for 06/23/14 at 3:15. [email or web address]

## 2014-06-23 ENCOUNTER — Ambulatory Visit (INDEPENDENT_AMBULATORY_CARE_PROVIDER_SITE_OTHER): Payer: Medicare Other | Admitting: Internal Medicine

## 2014-06-23 ENCOUNTER — Encounter: Payer: Self-pay | Admitting: Internal Medicine

## 2014-06-23 VITALS — BP 146/79 | HR 73 | Temp 98.0°F | Wt 215.0 lb

## 2014-06-23 DIAGNOSIS — R112 Nausea with vomiting, unspecified: Secondary | ICD-10-CM

## 2014-06-23 DIAGNOSIS — R197 Diarrhea, unspecified: Secondary | ICD-10-CM

## 2014-06-23 DIAGNOSIS — D509 Iron deficiency anemia, unspecified: Secondary | ICD-10-CM | POA: Diagnosis not present

## 2014-06-23 DIAGNOSIS — E119 Type 2 diabetes mellitus without complications: Secondary | ICD-10-CM | POA: Diagnosis present

## 2014-06-23 LAB — CBC WITH DIFFERENTIAL/PLATELET
Basophils Absolute: 0 10*3/uL (ref 0.0–0.1)
Basophils Relative: 0 % (ref 0–1)
EOS ABS: 0.1 10*3/uL (ref 0.0–0.7)
EOS PCT: 2 % (ref 0–5)
HCT: 35.4 % — ABNORMAL LOW (ref 36.0–46.0)
Hemoglobin: 11.1 g/dL — ABNORMAL LOW (ref 12.0–15.0)
LYMPHS PCT: 39 % (ref 12–46)
Lymphs Abs: 2.3 10*3/uL (ref 0.7–4.0)
MCH: 25.8 pg — ABNORMAL LOW (ref 26.0–34.0)
MCHC: 31.4 g/dL (ref 30.0–36.0)
MCV: 82.3 fL (ref 78.0–100.0)
MONO ABS: 0.5 10*3/uL (ref 0.1–1.0)
MONOS PCT: 8 % (ref 3–12)
MPV: 10 fL (ref 8.6–12.4)
NEUTROS ABS: 3 10*3/uL (ref 1.7–7.7)
Neutrophils Relative %: 51 % (ref 43–77)
Platelets: 232 10*3/uL (ref 150–400)
RBC: 4.3 MIL/uL (ref 3.87–5.11)
RDW: 19.7 % — AB (ref 11.5–15.5)
WBC: 5.8 10*3/uL (ref 4.0–10.5)

## 2014-06-23 LAB — RETICULOCYTES
ABS RETIC: 90.3 10*3/uL (ref 19.0–186.0)
RBC.: 4.3 MIL/uL (ref 3.87–5.11)
RETIC CT PCT: 2.1 % (ref 0.4–2.3)

## 2014-06-23 LAB — BASIC METABOLIC PANEL WITH GFR
BUN: 6 mg/dL (ref 6–23)
CALCIUM: 9.1 mg/dL (ref 8.4–10.5)
CO2: 22 mEq/L (ref 19–32)
CREATININE: 0.84 mg/dL (ref 0.50–1.10)
Chloride: 105 mEq/L (ref 96–112)
GFR, Est African American: >89 mL/min
GFR, Est Non African American: 84 mL/min
GLUCOSE: 91 mg/dL (ref 70–99)
POTASSIUM: 4 meq/L (ref 3.5–5.3)
Sodium: 138 mEq/L (ref 135–145)

## 2014-06-23 LAB — HM DIABETES EYE EXAM

## 2014-06-23 MED ORDER — SACCHAROMYCES BOULARDII 250 MG PO CAPS: 250.0000 mg | ORAL_CAPSULE | Freq: Two times a day (BID) | ORAL | Status: DC

## 2014-06-23 MED ORDER — LOPERAMIDE HCL 2 MG PO TABS: 2.0000 mg | ORAL_TABLET | Freq: Four times a day (QID) | ORAL | Status: DC | PRN

## 2014-06-24 ENCOUNTER — Telehealth: Payer: Self-pay | Admitting: *Deleted

## 2014-06-24 DIAGNOSIS — Z77011 Contact with and (suspected) exposure to lead: Secondary | ICD-10-CM

## 2014-06-24 NOTE — Assessment & Plan Note (Signed)
Pt complaining of diarrhea x 6 months and vomiting x 2 months. Was last seen by Home last month for hematemesis and Cdiff was checked which was negative. Pt states she has about 2 good days per week. Denies intermittent bouts of constipation. Denies relief of abdominal pain after having a bowel movement. States she has 6-7 BMs per day that worsens when she is on her menstrual cycle. She has tried imodium in the past for diarrhea which has helped but has not tried imodium recently. She denies fever and is tolerating po intake. Denies recent illnesses. States she was recently given an abx when last seen in the ED but per chart review while in the room with patient could not find any recent abx that were prescribed. Pt has nausea but only vomits when she eats too much.  DDx could be malabsorbtion however pt has actually gained weight (205lbs on 06/10/14 and 215lbs today in clinic), IBS but does not get relief after BMs and denies hx of constipation, malignancy although had colonoscopy in 2013 that was neg for except for rt sided colonic diverticulosis and EGD on 12/2013 that was significant for trivial antral erosions and petechiae and small hiatal hernia.   - given rx for imodium and florastor - instructed to f/u with GI if symptoms persist - CBC and BMET to check for possible electrolyte disturbances and anemia from chronic diarrhea  - ordered fecal fat, lactoferrin, culture and O&P. Limited about of diagnostic tests this clinic orders as has to be with [facility].

## 2014-06-24 NOTE — Assessment & Plan Note (Signed)
Podiatry referral placed and patient had retinal camera fundoscope done in clinic today.

## 2014-06-24 NOTE — Progress Notes (Signed)
Subjective:     Patient ID: [name], female   DOB: 25-Sep-1965, 49 y.o.   MRN: [id number]  HPI Pt is a 49 y/o female w/ PMHx of HTN, GERD, COPD, tobacco abuse, DM2, and HLD who presents to clinic for nausea and diarrhea. Please see problem list for further details.    Review of Systems  Constitutional: Negative for fever, chills and appetite change.  Gastrointestinal: Positive for nausea, vomiting and diarrhea. Negative for constipation and blood in stool.  Neurological: Negative for weakness.       Objective:   Physical Exam  Constitutional: She appears well-developed and well-nourished. No distress.  HENT:  Head: Normocephalic and atraumatic.  Cardiovascular: Normal rate and regular rhythm.   Pulmonary/Chest: Effort normal and breath sounds normal.  Abdominal: Soft. Bowel sounds are normal. She exhibits no distension. There is no tenderness. There is no rebound and no guarding.  Skin: Skin is warm and dry.       Assessment:     Please see problem based assessment and plan.       Plan:     Please see problem based assessment and plan.

## 2014-06-24 NOTE — Progress Notes (Signed)
Medicine attending: Medical history, presenting problems, physical findings, and medications, reviewed with Dr Diana Truong on the day of the patient visit and I concur with her evaluation and management plan. 

## 2014-06-24 NOTE — Telephone Encounter (Addendum)
Pt called stating she was informed that she might have lead in her water, at her house.  This was noted when her filters were changed.  It could be erosion from copper pipes.. She wants to know if there is any type of test that can be none for this.  She states she has felt bad for months and is thinking this may be the cause. Pt lives in [place] and would like labs done there.  Pt # [id number]

## 2014-06-25 ENCOUNTER — Other Ambulatory Visit: Payer: Self-pay | Admitting: Obstetrics & Gynecology

## 2014-06-25 ENCOUNTER — Emergency Department (HOSPITAL_COMMUNITY)
Admission: EM | Admit: 2014-06-25 | Discharge: 2014-06-25 | Discharge status: Against Medical Advice | Payer: Medicare Other | Attending: Emergency Medicine | Admitting: Emergency Medicine

## 2014-06-25 ENCOUNTER — Emergency Department (HOSPITAL_COMMUNITY): Payer: Medicare Other

## 2014-06-25 ENCOUNTER — Encounter (HOSPITAL_COMMUNITY): Payer: Self-pay | Admitting: *Deleted

## 2014-06-25 DIAGNOSIS — I2510 Atherosclerotic heart disease of native coronary artery without angina pectoris: Secondary | ICD-10-CM | POA: Insufficient documentation

## 2014-06-25 DIAGNOSIS — J301 Allergic rhinitis due to pollen: Secondary | ICD-10-CM | POA: Diagnosis not present

## 2014-06-25 DIAGNOSIS — E669 Obesity, unspecified: Secondary | ICD-10-CM | POA: Diagnosis not present

## 2014-06-25 DIAGNOSIS — R51 Headache: Secondary | ICD-10-CM | POA: Diagnosis not present

## 2014-06-25 DIAGNOSIS — R079 Chest pain, unspecified: Secondary | ICD-10-CM | POA: Diagnosis not present

## 2014-06-25 DIAGNOSIS — Z720 Tobacco use: Secondary | ICD-10-CM | POA: Insufficient documentation

## 2014-06-25 DIAGNOSIS — I10 Essential (primary) hypertension: Secondary | ICD-10-CM | POA: Diagnosis not present

## 2014-06-25 DIAGNOSIS — E119 Type 2 diabetes mellitus without complications: Secondary | ICD-10-CM | POA: Insufficient documentation

## 2014-06-25 DIAGNOSIS — Z1231 Encounter for screening mammogram for malignant neoplasm of breast: Secondary | ICD-10-CM

## 2014-06-25 NOTE — ED Notes (Signed)
Pt not found in waiting area x 3 when called. Chair that pt was in is empty with blanket lying there.

## 2014-06-25 NOTE — ED Notes (Signed)
Pt states discomfort across chest began 30 minutes PTA. Pt also states nausea. Pt states she hasn't felt well in a while, stating, "They think i may have lead posion." Pt coughing at triage but states, "I really haven't been coughting.

## 2014-06-28 ENCOUNTER — Ambulatory Visit: Payer: Self-pay | Admitting: Adult Health

## 2014-06-28 ENCOUNTER — Encounter: Payer: Self-pay | Admitting: *Deleted

## 2014-06-29 ENCOUNTER — Ambulatory Visit (INDEPENDENT_AMBULATORY_CARE_PROVIDER_SITE_OTHER): Payer: Medicare Other | Admitting: Adult Health

## 2014-06-29 ENCOUNTER — Encounter: Payer: Self-pay | Admitting: Adult Health

## 2014-06-29 VITALS — BP 122/72 | HR 77 | Ht 64.0 in | Wt 212.8 lb

## 2014-06-29 DIAGNOSIS — R0789 Other chest pain: Secondary | ICD-10-CM | POA: Diagnosis not present

## 2014-06-29 DIAGNOSIS — R002 Palpitations: Secondary | ICD-10-CM | POA: Diagnosis not present

## 2014-06-29 NOTE — Progress Notes (Deleted)
Name: [name]    DOB: Feb 24, 1966  Age: 49 y.o.  MR#: [id number]       PCP:  Julious Oka, MD      Insurance: Payor: MEDICARE / Plan: MEDICARE PART A AND B / Product Type: *No Product type* /   CC:    Chief Complaint  Patient presents with  . Chest Pain  . Hypertension    VS Filed Vitals:   06/29/14 1513  BP: 122/72  Pulse: 77  Height: 5\' 4"  (1.626 m)  Weight: 212 lb 12.8 oz (96.525 kg)  SpO2: 99%    Weights Current Weight  06/29/14 212 lb 12.8 oz (96.525 kg)  06/25/14 214 lb (97.07 kg)  06/23/14 215 lb (97.523 kg)    Blood Pressure  BP Readings from Last 3 Encounters:  06/29/14 122/72  06/25/14 183/96  06/23/14 146/79     Admit date:  (Not on file) Last encounter with RMR:  Visit date not found   Allergy Cephalexin; Metronidazole; Orange; Shrimp; Penicillins; Sulfonamide derivatives; Glipizide; and Sulfamethoxazole-trimethoprim  Current Outpatient Prescriptions  Medication Sig Dispense Refill  . acetaminophen (TYLENOL) 500 MG tablet Take 1,000 mg by mouth every 6 (six) hours as needed for mild pain.    Marland Kitchen albuterol (PROVENTIL HFA;VENTOLIN HFA) 108 (90 BASE) MCG/ACT inhaler Inhale 2 puffs into the lungs 3 (three) times daily as needed for wheezing or shortness of breath. 1 Inhaler 2  . albuterol (PROVENTIL) (2.5 MG/3ML) 0.083% nebulizer solution Take 3 mLs (2.5 mg total) by nebulization every 6 (six) hours as needed for wheezing or shortness of breath. 75 mL 12  . buPROPion (WELLBUTRIN XL) 150 MG 24 hr tablet Take 150 mg by mouth daily. prescribed by [facility] for smoking cessation.    Marland Kitchen esomeprazole (NEXIUM) 40 MG capsule Take 1 capsule (40 mg total) by mouth 2 (two) times daily before a meal. 60 capsule 2  . ferrous sulfate 325 (65 FE) MG tablet Take 1 tablet (325 mg total) by mouth 3 (three) times daily with meals. 90 tablet 3  . furosemide (LASIX) 20 MG tablet TAKE 1 TABLET BY MOUTH ONCE DAILY. 30 tablet 6  . ibuprofen (ADVIL,MOTRIN) 200 MG tablet Take 400 mg  by mouth every 6 (six) hours as needed for mild pain.    . Insulin Glargine (LANTUS SOLOSTAR) 100 UNIT/ML Solostar Pen Inject 22 Units into the skin at bedtime. 15 mL 12  . lisinopril (PRINIVIL,ZESTRIL) 40 MG tablet Take 1 tablet (40 mg total) by mouth daily. 30 tablet 4  . loperamide (IMODIUM A-D) 2 MG tablet Take 1 tablet (2 mg total) by mouth 4 (four) times daily as needed for diarrhea or loose stools. 30 tablet 0  . lovastatin (MEVACOR) 20 MG tablet Take 1 tablet (20 mg total) by mouth at bedtime. 90 tablet 3  . meclizine (ANTIVERT) 50 MG tablet Take 1 tablet (50 mg total) by mouth 3 (three) times daily as needed for dizziness. 30 tablet 0  . metFORMIN (GLUCOPHAGE) 1000 MG tablet TAKE (1) TABLET BY MOUTH TWICE DAILY WITH MEALS. 60 tablet 6  . metoprolol (LOPRESSOR) 100 MG tablet Take 1 tablet (100 mg total) by mouth 2 (two) times daily. 60 tablet 6  . mometasone (NASONEX) 50 MCG/ACT nasal spray Place 2 sprays into the nose 2 (two) times daily. 17 g 12  . ondansetron (ZOFRAN ODT) 4 MG disintegrating tablet 4mg  ODT q4 hours prn nausea/vomit 12 tablet 0  . potassium chloride SA (K-DUR,KLOR-CON) 20 MEQ tablet Take 1 tablet (  20 mEq total) by mouth 2 (two) times daily. 60 tablet 0  . promethazine (PHENERGAN) 25 MG suppository Place 1 suppository (25 mg total) rectally every 6 (six) hours as needed for nausea or vomiting. 12 each 0  . saccharomyces boulardii (FLORASTOR) 250 MG capsule Take 1 capsule (250 mg total) by mouth 2 (two) times daily. 60 capsule 1  . sucralfate (CARAFATE) 1 G tablet Take 1 g by mouth 4 (four) times daily -  with meals and at bedtime.    . ziprasidone (GEODON) 80 MG capsule Take 1 capsule (80 mg total) by mouth 2 (two) times daily with a meal. 60 capsule 0  . promethazine (PHENERGAN) 25 MG tablet Take 1 tablet (25 mg total) by mouth every 6 (six) hours as needed for nausea. 12 tablet 0  . promethazine (PHENERGAN) 25 MG tablet Take 1 tablet (25 mg total) by mouth every 6 (six)  hours as needed for nausea. 30 tablet 0  . [DISCONTINUED] potassium chloride (K-DUR) 10 MEQ tablet Take 1 tablet (10 mEq total) by mouth daily. 30 tablet 3   No current facility-administered medications for this visit.   Facility-Administered Medications Ordered in Other Visits  Medication Dose Route Frequency Provider Last Rate Last Dose  . 0.9 %  sodium chloride infusion   Intravenous Once Merryl Hacker, MD      . ondansetron [facility]) injection 4 mg  4 mg Intramuscular Once Merryl Hacker, MD      . pantoprazole (PROTONIX) injection 40 mg  40 mg Intravenous Once Merryl Hacker, MD        Discontinued Meds:   There are no discontinued medications.  Patient Active Problem List   Diagnosis Date Noted  . Sinusitis, acute 05/24/2014  . Viral gastroenteritis 05/06/2014  . Viral respiratory illness 03/17/2014  . Seasonal allergies 02/23/2014  . Flank pain, acute 01/01/2014  . Hypokalemia 12/25/2013  . Headache 12/07/2013  . Dyspepsia 11/19/2013  . Leg swelling 10/21/2013  . Ethmoid sinusitis 10/14/2013  . Diarrhea 10/14/2013  . Daytime hypersomnolence 12/09/2012  . COPD (chronic obstructive pulmonary disease) 10/20/2012  . GERD (gastroesophageal reflux disease) 10/15/2012  . Preventative health care 10/15/2012  . Pulmonary hypertension 05/02/2012  . Diastolic heart failure 70/96/2836  . IDA (iron deficiency anemia) 11/06/2011  . Arteriosclerotic cardiovascular disease (ASCVD)   . TOBACCO ABUSE 09/27/2009  . Hyperlipidemia 07/22/2007  . Obesity 07/22/2007  . Bipolar 1 disorder 07/22/2007  . Essential hypertension 07/22/2007  . Diabetes mellitus type II, controlled 09/28/2002    LABS    Component Value Date/Time   NA 138 06/23/2014 1637   NA 134* 06/10/2014 1800   NA 136 05/20/2014 1452   K 4.0 06/23/2014 1637   K 3.6 06/10/2014 1800   K 3.3* 05/20/2014 1452   CL 105 06/23/2014 1637   CL 109 06/10/2014 1800   CL 105 05/20/2014 1452   CO2 22 06/23/2014 1637    CO2 24 06/10/2014 1800   CO2 23 05/20/2014 1452   GLUCOSE 91 06/23/2014 1637   GLUCOSE 131* 06/10/2014 1800   GLUCOSE 202* 05/20/2014 1452   BUN 6 06/23/2014 1637   BUN 9 06/10/2014 1800   BUN 6 05/20/2014 1452   CREATININE 0.84 06/23/2014 1637   CREATININE 0.74 06/10/2014 1800   CREATININE 0.97 05/20/2014 1452   CREATININE 0.85 05/14/2014 1123   CREATININE 0.79 02/10/2014 1656   CREATININE 0.75 01/01/2014 1428   CALCIUM 9.1 06/23/2014 1637   CALCIUM 8.4 06/10/2014 1800  CALCIUM 9.1 05/20/2014 1452   GFRNONAA 84 06/23/2014 1637   GFRNONAA >90 06/10/2014 1800   GFRNONAA 69* 05/20/2014 1452   GFRNONAA 81* 05/14/2014 1123   GFRNONAA >89 02/10/2014 1656   GFRNONAA >89 01/01/2014 1428   GFRAA >89 06/23/2014 1637   GFRAA >90 06/10/2014 1800   GFRAA 80* 05/20/2014 1452   GFRAA >90 05/14/2014 1123   GFRAA >89 02/10/2014 1656   GFRAA >89 01/01/2014 1428   CMP     Component Value Date/Time   NA 138 06/23/2014 1637   K 4.0 06/23/2014 1637   CL 105 06/23/2014 1637   CO2 22 06/23/2014 1637   GLUCOSE 91 06/23/2014 1637   BUN 6 06/23/2014 1637   CREATININE 0.84 06/23/2014 1637   CREATININE 0.74 06/10/2014 1800   CALCIUM 9.1 06/23/2014 1637   PROT 6.2 06/10/2014 1800   ALBUMIN 3.4* 06/10/2014 1800   AST 14 06/10/2014 1800   ALT 11 06/10/2014 1800   ALKPHOS 53 06/10/2014 1800   BILITOT 0.3 06/10/2014 1800   GFRNONAA 84 06/23/2014 1637   GFRNONAA >90 06/10/2014 1800   GFRAA >89 06/23/2014 1637   GFRAA >90 06/10/2014 1800       Component Value Date/Time   WBC 5.8 06/23/2014 1637   WBC 7.6 06/10/2014 1800   WBC 7.5 05/20/2014 1452   HGB 11.1* 06/23/2014 1637   HGB 11.0* 06/10/2014 1800   HGB 11.6* 05/20/2014 1452   HGB 10.6* 01/15/2013 1129   HGB 12.0* 12/23/2012 1157   HCT 35.4* 06/23/2014 1637   HCT 33.6* 06/10/2014 1800   HCT 36.1 05/20/2014 1452   MCV 82.3 06/23/2014 1637   MCV 81.2 06/10/2014 1800   MCV 77.8* 05/20/2014 1452    Lipid Panel     Component  Value Date/Time   CHOL 133 02/10/2014 1656   TRIG 245* 02/10/2014 1656   HDL 36* 02/10/2014 1656   CHOLHDL 3.7 02/10/2014 1656   VLDL 49* 02/10/2014 1656   LDLCALC 48 02/10/2014 1656    ABG    Component Value Date/Time   PHART 7.385 04/23/2012 0958   PCO2ART 36.8 04/23/2012 0958   PO2ART 88.0 04/23/2012 0958   HCO3 21.6 04/23/2012 0958   TCO2 22 01/08/2014 1443   ACIDBASEDEF 2.7* 04/23/2012 0958   O2SAT 97.0 04/23/2012 0958     Lab Results  Component Value Date   TSH 1.315 12/24/2013   BNP (last 3 results)  Recent Labs  05/20/14 1452 06/10/14 1800  BNP 59.5 34.0    ProBNP (last 3 results)  Recent Labs  07/22/13 0041 09/16/13 0205  PROBNP 565.2* 75.6    Cardiac Panel (last 3 results) No results for input(s): CKTOTAL, CKMB, TROPONINI, RELINDX in the last 72 hours.  Iron/TIBC/Ferritin/ %Sat    Component Value Date/Time   IRON 23* 04/30/2014 1557   TIBC 354 04/30/2014 1557   FERRITIN 13 04/30/2014 1557   IRONPCTSAT 6* 04/30/2014 1557     EKG Orders placed or performed during the hospital encounter of 06/25/14  . EKG 12-Lead  . EKG 12-Lead     Prior Assessment and Plan Problem List as of 06/29/2014      Cardiovascular and Mediastinum   Essential hypertension   Last Assessment & Plan 05/05/2014 Office Visit Written 05/06/2014  8:19 AM by Julious Oka, MD    BP Readings from Last 3 Encounters:  05/05/14 148/72  03/17/14 174/88  03/14/14 188/95    Lab Results  Component Value Date   NA 133* 05/04/2014  K 3.0* 05/04/2014   CREATININE 0.86 05/04/2014    Assessment: Blood pressure control:  controlled Progress toward BP goal:   at goal Comments: During last visit lisinopril was increased from 20mg  to 40mg , and lopressor was increased from 50 to 100mg . Lasix 20mg  was continued. Pt here for a f/u and requesting to be referred to Dr. Dorris Fetch for her HTN. Informed pt that her BP is controlled right now and that we have a plan in place for pt if her BP  continued to be elevated despite recent increases in BP meds by adding norvasc. After that would start work up for secondary HTN. Pt okay with this.   Plan: Medications:  continue current medications Other plans: f/u in 1 month for DM and will check BP then.        Arteriosclerotic cardiovascular disease (ASCVD)   Last Assessment & Plan 07/21/2012 Office Visit Written 07/21/2012  3:08 PM by Lendon Colonel, NP    Most recent echo revealed normal systolic function. This was completed in January 2014. She continues to have chest pressure which I believe is related to her lung disease. However she is continuing to have palpitations and mild dyspnea on exertion and is requesting a repeat stress test. We will order this and have this completed within the next week. She has multiple cardiovascular risk factors and has continued to smoke despite health warnings. We will make further recommendations with stress test has been completed.      Diastolic heart failure   Last Assessment & Plan 09/30/2013 Office Visit Written 10/01/2013 12:49 AM by Juluis Mire, MD    Assessment: Pt with grade 2 diastolic dysfunction with last 2D-echo on 10/20/12 recently restarted on diuretic therapy who presents with euvolemia.    Plan:  -Continue furosemide 20 mg daily  -Continue to monitor volume status       Pulmonary hypertension   Last Assessment & Plan 10/27/2012 Office Visit Written 10/27/2012  4:50 PM by Rigoberto Noel, MD    RVSP decreased to 37 in 09/2012 -recorded as 57 during acute illness in jan        Respiratory   COPD (chronic obstructive pulmonary disease)   Last Assessment & Plan 02/06/2013 Office Visit Written 02/06/2013  5:02 PM by Rigoberto Noel, MD    Smoking cessation and lifestyle changes remain primary interventions here and this was emphasized during visit Further followup can be with PCP We discussed signs of COPD flare and she can call us should this occur.      Ethmoid sinusitis   Last  Assessment & Plan 10/14/2013 Office Visit Written 10/14/2013  4:38 PM by Arman Filter, MD    Most consistent with acute viral sinusitis given recent antibiotic usage and lack of nasal discharge.  She says the headaches are different than migraines she had in the past, and she denies sensitivity to light or nausea.  Motrin helped, but I would like to avoid NSAIDs given her history of gastritis. -Recommend supportive care and follow up in one week if her symptoms don't resolve. -Refilled Tylenol #3 to help with acute pain. -Continue nasal irrigation as needed.      Seasonal allergies   Last Assessment & Plan 02/22/2014 Office Visit Written 02/23/2014  1:17 PM by Cresenciano Genre, MD    Rx Nasonex and Zyrtec       Viral respiratory illness   Last Assessment & Plan 03/17/2014 Office Visit Edited 03/17/2014  3:47 PM by Otho Bellows,  MD    Pt with low grade temperature at home noticed when she is due for a dose of ibuprofen. She is afebrile in the clinic. She states that she has been wheezing in addition to having a dry cough, but on exam she is clear to ascultation, O2 saturation is 100% on room air, and she is in no distress. She continues to smoke, which is likely making her respiratory symptoms worse. Giving a Duoneb treatment here in clinic. Discussed with the patient that this cough could persist for a few weeks even after she is feeling better.  - She is to continue the cough syrup provided in the ED - She is to continue her Zyrtec and Nasonex - Ordering nebulizer machine and albuterol/atrovent meds. - Smoking cessation was discussed with the patient, she is contemplating cessation - She is to call the clinic if she develops a fever or if her breathing worsens      Sinusitis, acute   Last Assessment & Plan 05/24/2014 Office Visit Written 05/24/2014  3:27 PM by Clinton Gallant, MD    Patient seems to have classic findings of sinusitis and ongoing symptoms for at least 2-3 months. Has not had any other  antibiotic treatment up until this point. -Patient has penicillin allergy therefore will give levofloxacin 500 mg daily for 5 days -ENT referral        Digestive   GERD (gastroesophageal reflux disease)   Last Assessment & Plan 05/05/2014 Office Visit Written 05/06/2014  8:38 AM by Julious Oka, MD    Refilled Nexium for GERD, has no complaints today regarding acid reflux.       Viral gastroenteritis   Last Assessment & Plan 05/05/2014 Office Visit Written 05/06/2014  8:32 AM by Julious Oka, MD    Pt was extremely lethargic during exam, pt's name had to be called to wake pt up during med rec. Orthostatic vitals today were negative (150/86 pulse, 151/87 standing). Pt's sx are improving. She is tolerating po liquids and food. She is having less BMs. Informed pt to return to clinic if her sx do not get better within 7-10 days. Instructed to cut down on phernergan and zofran if she feels more drowsy than usual. However, pt likely lethargic due to poor sleep as she only took phenergan once day before clinic visit and zofran once morning of visit. She is also on geodon which she gets from mental health.         Endocrine   Diabetes mellitus type II, controlled   Last Assessment & Plan 06/23/2014 Office Visit Written 06/24/2014  8:51 AM by Norman Herrlich, MD    Podiatry referral placed and patient had retinal camera fundoscope done in clinic today.         Other   Hyperlipidemia   Last Assessment & Plan 05/05/2014 Office Visit Written 05/06/2014  8:36 AM by Julious Oka, MD    LDL 48 in 01/2014. Given refill of statin.       Obesity   Last Assessment & Plan 01/19/2014 Office Visit Written 01/19/2014  6:15 PM by Jessee Avers, MD    Discussed weight loss with her as this may help with hypertension.      TOBACCO ABUSE   Last Assessment & Plan 03/17/2014 Office Visit Written 03/17/2014  2:23 PM by Otho Bellows, MD    Pt still smoking despite respiratory illness. Counseled on cessation.        Bipolar 1 disorder   Last Assessment & Plan  01/04/2012 Office Visit Written 08/05/2012  8:46 AM by Trish Fountain, MD    Mood today is worried but she denies SI, HI today.  She states that she has been taking her medications and going to [place].  I encouraged her to continue her medications as well as her counseling.       IDA (iron deficiency anemia)   Last Assessment & Plan 05/05/2014 Office Visit Written 05/06/2014  8:34 AM by Julious Oka, MD    Pt is non compliant w/ iron supplementation. Has difficulty remember to take it TID. Denies constipation. Given refill of ferrous sulfate 325mg  TID. Instructed to take colace if develops constipation from med. Recheck CBC at next visit in one month.       Preventative health care   Last Assessment & Plan 12/24/2013 Office Visit Written 12/25/2013  7:18 PM by Juluis Mire, MD    Pt declined annual influenza vaccination today.       Daytime hypersomnolence   Last Assessment & Plan 12/24/2013 Office Visit Written 12/25/2013  6:58 PM by Juluis Mire, MD    Assessment: Pt with history of daytime somnolence of who presents with recent symptoms with unclear etiology possibly due to narcolepsy vs OSA.   Plan:  -Obtain CMP ---> mild hypokalemia 3.4, prescribe potassium chloride 10 mEq daily due to chronic diuretic use -Obtain TSH ---> normal -Obtain CBC w/diff ----> Hg improved from 9.3 to 9.7 after starting oral iron therapy  -Pt declined sleep study and due to insurance issue not able to place referral at this time. She stated she would think about it.        Diarrhea   Last Assessment & Plan 06/23/2014 Office Visit Written 06/24/2014  8:50 AM by Norman Herrlich, MD    Pt complaining of diarrhea x 6 months and vomiting x 2 months. Was last seen by [place] last month for hematemesis and Cdiff was checked which was negative. Pt states she has about 2 good days per week. Denies intermittent bouts of constipation. Denies relief of abdominal  pain after having a bowel movement. States she has 6-7 BMs per day that worsens when she is on her menstrual cycle. She has tried imodium in the past for diarrhea which has helped but has not tried imodium recently. She denies fever and is tolerating po intake. Denies recent illnesses. States she was recently given an abx when last seen in the ED but per chart review while in the room with patient could not find any recent abx that were prescribed. Pt has nausea but only vomits when she eats too much.  DDx could be malabsorbtion however pt has actually gained weight (205lbs on 06/10/14 and 215lbs today in clinic), IBS but does not get relief after BMs and denies hx of constipation, malignancy although had colonoscopy in 2013 that was neg for except for rt sided colonic diverticulosis and EGD on 12/2013 that was significant for trivial antral erosions and petechiae and small hiatal hernia.   - given rx for imodium and florastor - instructed to f/u with GI if symptoms persist - CBC and BMET to check for possible electrolyte disturbances and anemia from chronic diarrhea  - ordered fecal fat, lactoferrin, culture and O&P. Limited about of diagnostic tests this clinic orders as has to be with [facility].       Leg swelling   Last Assessment & Plan 10/21/2013 Office Visit Written 10/21/2013  4:43 PM by Julious Oka, MD  Pt with 1+ pitting edema b/l. Well's score of 1, thus not concerned for DVT. Pt notes that swelling is going down. Thus insect bites could be source of leg swelling or possibly insufficient dose of lasix being giving. Will f/u in clinic in 4 weeks after diarrhea and insect bites fully resolve to recheck weight and readdress CHF meds.       Dyspepsia   Last Assessment & Plan 01/25/2014 Office Visit Edited 01/25/2014  9:54 AM by Mahala Menghini, PA-C    49 year old lady with history of recent recurrent nausea/vomiting, early satiety in the setting of NSAID use. EGD showed no evidence of persistent  ulcer. Really nothing to explain her ongoing symptoms. Patient has had significant improvement of her nausea and abdominal pain, feels much better. Currently on Nexium 40 mg twice daily, Carafate, Zofran as needed. Looking through her record she actually has a history of iron deficiency anemia. Somewhat heavy menses. Heme negative a couple months ago. Most recent colonoscopy 2013. IDA may be related to her menstrual cycle but need to consider GI etiology. Patient complains of bowel habit change the last couple of months, question etiology.  Initially we'll start with rechecking iron, ferritin, CBC. IFOBT. If she has recurrent nausea and vomiting, abdominal pain, consider abdominal ultrasound to check gallbladder. Further recommendations to follow.  Loose stool not addressed today, we'll touch base with patient once labs have returned.      Headache   Last Assessment & Plan 02/22/2014 Office Visit Written 02/23/2014  1:15 PM by Cresenciano Genre, MD    Likely related to uncontrolled HTN, could be also sinus inflammation though no active sinsusitis Changed BP meds increased to control BP Can take prn Tylenol or Advil for h/a Will try restarting Nasonex and zyrtec for allergies.        Hypokalemia   Last Assessment & Plan 01/01/2014 Office Visit Written 01/01/2014  4:31 PM by Dellia Nims, MD    Had mild hypokalemia of 3.4 last visit. Repeat BMP today showed K 3.6. Is taking K+ tablet. Likely 2/2 to lasix use. Continue KCl tablet 61mEQ.       Flank pain, acute   Last Assessment & Plan 01/01/2014 Office Visit Written 01/01/2014  4:15 PM by Dellia Nims, MD    Has aching left flank pain starting yesterdar. Has no diarrhea, fever/chills, dysuria. Having regular BM. Does have some distension/feels like has lot of gas. Did do lot of house work recently.  Could be MSK pain or 2/2 to gas/distension. UTI less likely given no fever/dysuria. Ovarian less likely given location of the pain and doesn't appear to be  as severe of a pain as one would expect from ovarian torsion.   Simethicone, tylenol for pain, Voltaren gel. instructed her to go to ED or come back to clinic if symptoms worsen or develops fever or other symptoms.           Imaging: Dg Chest 2 View  06/10/2014   CLINICAL DATA:  Chest pain with radiation down both arms  EXAM: CHEST  2 VIEW  COMPARISON:  05/20/2014  FINDINGS: The heart size and mediastinal contours are within normal limits. Both lungs are clear. Minimal scarring is noted in the left mid lung. The visualized skeletal structures are unremarkable.  IMPRESSION: No active cardiopulmonary disease.   Electronically Signed   By: Inez Catalina M.D.   On: 06/10/2014 19:03

## 2014-06-29 NOTE — Progress Notes (Signed)
Cardiology Office Note   Date:  06/29/2014   ID:  [name], DOB 20-Jun-1965, MRN [id number]  PCP:  Julious Oka, MD  Cardiologist:  [place]/(needs to be est in [facility]) Jory Sims, NP   Chief Complaint  Patient presents with  . Chest Pain  . Hypertension      History of Present Illness: [name] is a 49 y.o. female who presents for ongoing assessment and management of CAD, most recent cardiac cath in 2-007 with minimal disease, negative stsress test in 2008, and negative stress echo in 2010, hypertension, Grade II diastolic dysfunction, and hyperlipidemia. Other hx includes diabetes, anemia, schizoaffective disorder with multiple psych admissions. She comes today post ER visit for recurrent chest pain. She has been seen by GI and found to have hiatal hernia as well. She was ruled out for MI, but found to be hypertensive in ER. She was released and told to follow up with PCP and cardiology.   She today, without complaints with the exception of palpitations.  She continues to have heavy menses and is to followup with OB/GYN thinking that she is now in menopause.  She does have a history of anemia.  She denies any further chest pain.  She states that she threw up and felt much better and left the ER without further workup.  Past Medical History  Diagnosis Date  . Arteriosclerotic cardiovascular disease (ASCVD)     Minimal at cath in [facility].stress nuclear study in 8/08 with nl EF; neg stress echo in 2010  . Diabetes mellitus, type 2 2000    Onset in 2000; no insulin  . Hyperlipidemia   . Hypertension `    during treatment with Geodon  . Gastroesophageal reflux disease     Schatzki's ring  . Anemia, iron deficiency   . Alcohol abuse   . Depression   . Community acquired pneumonia 01/03/10, 05/2010, 04/2012    2011; with pleural effusion-[facility] acute resp failure; intubated in Jan 2014 (HMPV pneumonia)  . Obesity   . Schizoaffective disorder      requiring multiple psychiatric admissions  . Dysphagia   . Diastolic dysfunction     grade 2 per echo 2011  . Pulmonary hypertension 05/02/2012    Patient needs repeat echo in 06/2012   . History of alcohol abuse 07/22/2007    Qualifier: Diagnosis of  By: Lenn Cal      Past Surgical History  Procedure Laterality Date  . Dilation and curettage, diagnostic / therapeutic  1992  . Esophagogastroduodenoscopy  09/16/08    Dr. Trevor Iha hiatal hernia/excoriations involving the cardia and mucosa consistent with trauma, antral erosions  of linear petechiae ? gastritis versus early gastric antral vascular  ectasia.Marland Kitchen biopsy showed reactive gastropathy. No H. pylori.  . Esophagogastroduodenoscopy  09/2007    Dr. Evalee Mutton ring, dilated to 18 French Maloney dilator, small hiatal hernia, antral erosions, biopsies reactive gastropathy.  Azzie Almas dilation  07/17/2011    [facility] sedation-->distal esophageal stricture s/p dilation, chronic gastritis, multiple ulcers in stomach. no h.pylori  . Colonoscopy  01/2006    internal hemorrhoids  . Colonoscopy  01/10/2012    Dr. Rourk:Single anal canal hemorrhoidal tag likely source of  trivial hematochezia; right-sided colonic diverticulosis  . Esophagogastroduodenoscopy (egd) with propofol N/A 12/17/2013    HOZ:[id number] antral erosions and petechiae. Small hiatal hernia. No endoscopic explanation for patient's symptoms     Current Outpatient Prescriptions  Medication Sig Dispense Refill  . acetaminophen (TYLENOL) 500 MG  tablet Take 1,000 mg by mouth every 6 (six) hours as needed for mild pain.    Marland Kitchen albuterol (PROVENTIL HFA;VENTOLIN HFA) 108 (90 BASE) MCG/ACT inhaler Inhale 2 puffs into the lungs 3 (three) times daily as needed for wheezing or shortness of breath. 1 Inhaler 2  . albuterol (PROVENTIL) (2.5 MG/3ML) 0.083% nebulizer solution Take 3 mLs (2.5 mg total) by nebulization every 6 (six) hours as needed for wheezing or shortness of breath. 75 mL 12   . buPROPion (WELLBUTRIN XL) 150 MG 24 hr tablet Take 150 mg by mouth daily. prescribed by [facility] for smoking cessation.    Marland Kitchen esomeprazole (NEXIUM) 40 MG capsule Take 1 capsule (40 mg total) by mouth 2 (two) times daily before a meal. 60 capsule 2  . ferrous sulfate 325 (65 FE) MG tablet Take 1 tablet (325 mg total) by mouth 3 (three) times daily with meals. 90 tablet 3  . furosemide (LASIX) 20 MG tablet TAKE 1 TABLET BY MOUTH ONCE DAILY. 30 tablet 6  . ibuprofen (ADVIL,MOTRIN) 200 MG tablet Take 400 mg by mouth every 6 (six) hours as needed for mild pain.    . Insulin Glargine (LANTUS SOLOSTAR) 100 UNIT/ML Solostar Pen Inject 22 Units into the skin at bedtime. 15 mL 12  . lisinopril (PRINIVIL,ZESTRIL) 40 MG tablet Take 1 tablet (40 mg total) by mouth daily. 30 tablet 4  . loperamide (IMODIUM A-D) 2 MG tablet Take 1 tablet (2 mg total) by mouth 4 (four) times daily as needed for diarrhea or loose stools. 30 tablet 0  . lovastatin (MEVACOR) 20 MG tablet Take 1 tablet (20 mg total) by mouth at bedtime. 90 tablet 3  . meclizine (ANTIVERT) 50 MG tablet Take 1 tablet (50 mg total) by mouth 3 (three) times daily as needed for dizziness. 30 tablet 0  . metFORMIN (GLUCOPHAGE) 1000 MG tablet TAKE (1) TABLET BY MOUTH TWICE DAILY WITH MEALS. 60 tablet 6  . metoprolol (LOPRESSOR) 100 MG tablet Take 1 tablet (100 mg total) by mouth 2 (two) times daily. 60 tablet 6  . mometasone (NASONEX) 50 MCG/ACT nasal spray Place 2 sprays into the nose 2 (two) times daily. 17 g 12  . ondansetron (ZOFRAN ODT) 4 MG disintegrating tablet 4mg  ODT q4 hours prn nausea/vomit 12 tablet 0  . potassium chloride SA (K-DUR,KLOR-CON) 20 MEQ tablet Take 1 tablet (20 mEq total) by mouth 2 (two) times daily. 60 tablet 0  . promethazine (PHENERGAN) 25 MG suppository Place 1 suppository (25 mg total) rectally every 6 (six) hours as needed for nausea or vomiting. 12 each 0  . promethazine (PHENERGAN) 25 MG tablet Take 1 tablet (25 mg total) by  mouth every 6 (six) hours as needed for nausea. 12 tablet 0  . promethazine (PHENERGAN) 25 MG tablet Take 1 tablet (25 mg total) by mouth every 6 (six) hours as needed for nausea. 30 tablet 0  . saccharomyces boulardii (FLORASTOR) 250 MG capsule Take 1 capsule (250 mg total) by mouth 2 (two) times daily. 60 capsule 1  . sucralfate (CARAFATE) 1 G tablet Take 1 g by mouth 4 (four) times daily -  with meals and at bedtime.    . ziprasidone (GEODON) 80 MG capsule Take 1 capsule (80 mg total) by mouth 2 (two) times daily with a meal. 60 capsule 0  . [DISCONTINUED] potassium chloride (K-DUR) 10 MEQ tablet Take 1 tablet (10 mEq total) by mouth daily. 30 tablet 3   No current facility-administered medications for this visit.  Facility-Administered Medications Ordered in Other Visits  Medication Dose Route Frequency Provider Last Rate Last Dose  . 0.9 %  sodium chloride infusion   Intravenous Once Merryl Hacker, MD      . ondansetron [facility]) injection 4 mg  4 mg Intramuscular Once Merryl Hacker, MD      . pantoprazole (PROTONIX) injection 40 mg  40 mg Intravenous Once Merryl Hacker, MD        Allergies:   Cephalexin; Metronidazole; Orange; Shrimp; Penicillins; Sulfonamide derivatives; Glipizide; and Sulfamethoxazole-trimethoprim    Social History:  The patient  reports that she has been smoking Cigarettes.  She started smoking about 2 years ago. She has a 30 pack-year smoking history. She has never used smokeless tobacco. She reports that she does not drink alcohol or use illicit drugs.   Family History:  The patient's family history includes Colon cancer in her other; Heart disease in her sister; Hypertension in her mother; Stroke in her father. There is no history of Anesthesia problems, Hypotension, Malignant hyperthermia, or Pseudochol deficiency.    ROS: .   All other systems are reviewed and negative.Unless otherwise mentioned in  H&P above.   PHYSICAL EXAM: VS:  LMP 06/23/2014  , BMI There is no weight on file to calculate BMI. GEN: Well nourished, well developed, in no acute distress HEENT: normal Neck: no JVD, carotid bruits, or masses Cardiac: RRR; no murmurs, rubs, or gallops,no edema  Respiratory:  clear to auscultation bilaterally, normal work of breathing GI: soft, nontender, nondistended, + BS MS: no deformity or atrophy Skin: warm and dry, no rash Neuro:  Strength and sensation are intact Psych: euthymic mood, full affect   Recent Labs: 09/16/2013: Magnesium 1.6; Pro B Natriuretic peptide (BNP) 75.6 12/24/2013: TSH 1.315 06/10/2014: ALT 11; B Natriuretic Peptide 34.0 06/23/2014: BUN 6; Creatinine 0.84; Hemoglobin 11.1*; Platelets 232; Potassium 4.0; Sodium 138    Lipid Panel    Component Value Date/Time   CHOL 133 02/10/2014 1656   TRIG 245* 02/10/2014 1656   HDL 36* 02/10/2014 1656   CHOLHDL 3.7 02/10/2014 1656   VLDL 49* 02/10/2014 1656   LDLCALC 48 02/10/2014 1656      Wt Readings from Last 3 Encounters:  06/25/14 214 lb (97.07 kg)  06/23/14 215 lb (97.523 kg)  06/10/14 205 lb (92.987 kg)      ASSESSMENT AND PLAN:  1.Chest pain: Noncardiac in etiology. She states the discomfort went away after throwing up.no further cardiac testing is planned.  She had a normal stress test in 2008.  If she has recurrent symptoms would repeat stress test.  We will see her in one year unless she is symptomatic  2. Palpitations:She admits to drinking good bit of caffeine throughout the day and noticing her heart racing and having palpitations when she goes to bed at night.advised to stop drinking caffeine around noon.  This should help with palpitations when lying down.  She is advised to decrease nicotine as well.  She verbalizes understanding.  3.Anemia: She has heavy menses and also iron deficiency anemia.  She is to see her OB/GYN in April.   Current medicines are reviewed at length with the patient today.    No orders of the defined types were  placed in this encounter.     Disposition:   FU with 1 year  Signed, Jory Sims, NP  06/29/2014 7:19 AM    [facility] [address], [place], [place] [address] Phone: [phone])  [phone]; Fax: [phone]

## 2014-06-29 NOTE — Patient Instructions (Signed)
Your physician wants you to follow-up in: 1 year with Rachel Sims, NP. You will receive a reminder letter in the mail two months in advance. If you don't receive a letter, please call our office to schedule the follow-up appointment.  Your physician recommends that you continue on your current medications as directed. Please refer to the Current Medication list given to you today.  Thank you for choosing [facility]!

## 2014-07-01 ENCOUNTER — Other Ambulatory Visit: Payer: Self-pay | Admitting: Internal Medicine

## 2014-07-01 ENCOUNTER — Ambulatory Visit (HOSPITAL_COMMUNITY)
Admission: RE | Admit: 2014-07-01 | Discharge: 2014-07-01 | Disposition: A | Discharge status: Home / Self Care | Payer: Medicare Other | Source: Ambulatory Visit | Attending: Obstetrics & Gynecology | Admitting: Obstetrics & Gynecology

## 2014-07-01 DIAGNOSIS — Z1231 Encounter for screening mammogram for malignant neoplasm of breast: Secondary | ICD-10-CM

## 2014-07-06 ENCOUNTER — Emergency Department (INDEPENDENT_AMBULATORY_CARE_PROVIDER_SITE_OTHER)
Admission: EM | Admit: 2014-07-06 | Discharge: 2014-07-06 | Disposition: A | Discharge status: Home / Self Care | Payer: Medicare Other | Source: Home / Self Care | Attending: Family Medicine | Admitting: Family Medicine

## 2014-07-06 ENCOUNTER — Encounter (HOSPITAL_COMMUNITY): Payer: Self-pay | Admitting: Emergency Medicine

## 2014-07-06 DIAGNOSIS — B302 Viral pharyngoconjunctivitis: Secondary | ICD-10-CM | POA: Diagnosis not present

## 2014-07-06 LAB — POCT RAPID STREP A: Streptococcus, Group A Screen (Direct): NEGATIVE

## 2014-07-06 MED ORDER — OFLOXACIN 0.3 % OP SOLN: 1.0000 [drp] | Freq: Four times a day (QID) | OPHTHALMIC | Status: DC

## 2014-07-06 NOTE — Telephone Encounter (Signed)
Called pt at [phone]. Pt did not pick up and voice mail box was full. Placed order for blood lead level that she can come to the clinic to have drawn.   Julious Oka MD

## 2014-07-06 NOTE — ED Provider Notes (Signed)
CSN: [id number]     Arrival date & time 07/06/14  0904 History   First MD Initiated Contact with Patient 07/06/14 [phone]     Chief Complaint  Patient presents with  . Conjunctivitis  . Sore Throat   (Consider location/radiation/quality/duration/timing/severity/associated sxs/prior Treatment) HPI  49 year old female presents for evaluation of sore throat and conjunctivitis. This started this morning. The sore throat is worse when she woke up and is slightly better now. She says that her right eye was extremely red this morning injuring but it is also slightly better now. She denies fever, chills, NVD. No blurry vision or pain within the eye. No recent travel or sick contacts. No medications taken for treatment  Past Medical History  Diagnosis Date  . Arteriosclerotic cardiovascular disease (ASCVD)     Minimal at cath in [facility].stress nuclear study in 8/08 with nl EF; neg stress echo in 2010  . Diabetes mellitus, type 2 2000    Onset in 2000; no insulin  . Hyperlipidemia   . Hypertension `    during treatment with Geodon  . Gastroesophageal reflux disease     Schatzki's ring  . Anemia, iron deficiency   . Alcohol abuse   . Depression   . Community acquired pneumonia 01/03/10, 05/2010, 04/2012    2011; with pleural effusion-[facility] acute resp failure; intubated in Jan 2014 (HMPV pneumonia)  . Obesity   . Schizoaffective disorder     requiring multiple psychiatric admissions  . Dysphagia   . Diastolic dysfunction     grade 2 per echo 2011  . Pulmonary hypertension 05/02/2012    Patient needs repeat echo in 06/2012   . History of alcohol abuse 07/22/2007    Qualifier: Diagnosis of  By: Lenn Cal     Past Surgical History  Procedure Laterality Date  . Dilation and curettage, diagnostic / therapeutic  1992  . Esophagogastroduodenoscopy  09/16/08    Dr. Trevor Iha hiatal hernia/excoriations involving the cardia and mucosa consistent with trauma, antral erosions  of  linear petechiae ? gastritis versus early gastric antral vascular  ectasia.Marland Kitchen biopsy showed reactive gastropathy. No H. pylori.  . Esophagogastroduodenoscopy  09/2007    Dr. Evalee Mutton ring, dilated to 20 French Maloney dilator, small hiatal hernia, antral erosions, biopsies reactive gastropathy.  Azzie Almas dilation  07/17/2011    [facility] sedation-->distal esophageal stricture s/p dilation, chronic gastritis, multiple ulcers in stomach. no h.pylori  . Colonoscopy  01/2006    internal hemorrhoids  . Colonoscopy  01/10/2012    Dr. Rourk:Single anal canal hemorrhoidal tag likely source of  trivial hematochezia; right-sided colonic diverticulosis  . Esophagogastroduodenoscopy (egd) with propofol N/A 12/17/2013    IRJ:JOACZYS antral erosions and petechiae. Small hiatal hernia. No endoscopic explanation for patient's symptoms   Family History  Problem Relation Age of Onset  . Colon cancer Other   . Hypertension Mother   . Stroke Father     deceased at age 21  . Heart disease Sister   . Anesthesia problems Neg Hx   . Hypotension Neg Hx   . Malignant hyperthermia Neg Hx   . Pseudochol deficiency Neg Hx    History  Substance Use Topics  . Smoking status: Current Every Day Smoker -- 1.00 packs/day for 30 years    Types: Cigarettes    Start date: 05/18/2012  . Smokeless tobacco: Never Used     Comment: cutting back,  . Alcohol Use: No     Comment: hx of ETOH abuse   OB  History    Gravida Para Term Preterm AB TAB SAB Ectopic Multiple Living   3 2 2  1 1    2      Review of Systems  Constitutional: Negative for fever and chills.  HENT: Positive for sore throat.   Eyes: Positive for pain, discharge and redness.  All other systems reviewed and are negative.   Allergies  Cephalexin; Metronidazole; Orange; Shrimp; Penicillins; Sulfonamide derivatives; Glipizide; and Sulfamethoxazole-trimethoprim  Home Medications   Prior to Admission medications   Medication Sig Start Date End Date  Taking? Authorizing Provider  acetaminophen (TYLENOL) 500 MG tablet Take 1,000 mg by mouth every 6 (six) hours as needed for mild pain.    Historical Provider, MD  albuterol (PROVENTIL HFA;VENTOLIN HFA) 108 (90 BASE) MCG/ACT inhaler Inhale 2 puffs into the lungs 3 (three) times daily as needed for wheezing or shortness of breath. 10/27/12   Rigoberto Noel, MD  albuterol (PROVENTIL) (2.5 MG/3ML) 0.083% nebulizer solution Take 3 mLs (2.5 mg total) by nebulization every 6 (six) hours as needed for wheezing or shortness of breath. 03/17/14   Otho Bellows, MD  buPROPion (WELLBUTRIN XL) 150 MG 24 hr tablet Take 150 mg by mouth daily. prescribed by [facility] for smoking cessation.    Amedeo Kinsman, NP  esomeprazole (NEXIUM) 40 MG capsule Take 1 capsule (40 mg total) by mouth 2 (two) times daily before a meal. 05/05/14   Norman Herrlich, MD  ferrous sulfate 325 (65 FE) MG tablet Take 1 tablet (325 mg total) by mouth 3 (three) times daily with meals. 05/05/14 05/05/15  Norman Herrlich, MD  furosemide (LASIX) 20 MG tablet TAKE 1 TABLET BY MOUTH ONCE DAILY. 05/05/14   Norman Herrlich, MD  ibuprofen (ADVIL,MOTRIN) 200 MG tablet Take 400 mg by mouth every 6 (six) hours as needed for mild pain.    Historical Provider, MD  Insulin Glargine (LANTUS SOLOSTAR) 100 UNIT/ML Solostar Pen Inject 22 Units into the skin at bedtime. 12/07/13   Juluis Mire, MD  lisinopril (PRINIVIL,ZESTRIL) 40 MG tablet Take 1 tablet (40 mg total) by mouth daily. 05/05/14   Norman Herrlich, MD  loperamide (IMODIUM A-D) 2 MG tablet Take 1 tablet (2 mg total) by mouth 4 (four) times daily as needed for diarrhea or loose stools. 06/23/14   Norman Herrlich, MD  lovastatin (MEVACOR) 20 MG tablet Take 1 tablet (20 mg total) by mouth at bedtime. 05/05/14   Norman Herrlich, MD  meclizine (ANTIVERT) 50 MG tablet Take 1 tablet (50 mg total) by mouth 3 (three) times daily as needed for dizziness. 06/10/14   Daleen Bo, MD  metFORMIN (GLUCOPHAGE) 1000 MG tablet  TAKE (1) TABLET BY MOUTH TWICE DAILY WITH MEALS. 05/05/14   Norman Herrlich, MD  metoprolol (LOPRESSOR) 100 MG tablet Take 1 tablet (100 mg total) by mouth 2 (two) times daily. 05/05/14   Norman Herrlich, MD  mometasone (NASONEX) 50 MCG/ACT nasal spray Place 2 sprays into the nose 2 (two) times daily. 02/22/14   Cresenciano Genre, MD  ofloxacin (OCUFLOX) 0.3 % ophthalmic solution Place 1 drop into the right eye 4 (four) times daily. 07/06/14   Liam Graham, PA-C  ondansetron (ZOFRAN ODT) 4 MG disintegrating tablet 4mg  ODT q4 hours prn nausea/vomit 05/04/14   Milton Ferguson, MD  potassium chloride (K-DUR) 10 MEQ tablet TAKE 1 TABLET BY MOUTH ONCE DAILY. 07/05/14   Norman Herrlich, MD  potassium chloride SA (K-DUR,KLOR-CON) 20 MEQ tablet  Take 1 tablet (20 mEq total) by mouth 2 (two) times daily. 10/22/00   Delora Fuel, MD  promethazine (PHENERGAN) 25 MG suppository Place 1 suppository (25 mg total) rectally every 6 (six) hours as needed for nausea or vomiting. 05/05/14   Milton Ferguson, MD  promethazine (PHENERGAN) 25 MG tablet Take 1 tablet (25 mg total) by mouth every 6 (six) hours as needed for nausea. 03/28/11 04/04/11  Fredia Sorrow, MD  promethazine (PHENERGAN) 25 MG tablet Take 1 tablet (25 mg total) by mouth every 6 (six) hours as needed for nausea. 04/06/11 04/13/11  Vivi Ferns, MD  saccharomyces boulardii (FLORASTOR) 250 MG capsule Take 1 capsule (250 mg total) by mouth 2 (two) times daily. 06/23/14   Norman Herrlich, MD  sucralfate (CARAFATE) 1 G tablet Take 1 g by mouth 4 (four) times daily -  with meals and at bedtime.    Historical Provider, MD  ziprasidone (GEODON) 80 MG capsule Take 1 capsule (80 mg total) by mouth 2 (two) times daily with a meal. 03/26/12   Patrecia Pour, NP   BP 183/69 mmHg  Pulse 77  Temp(Src) 98.1 F (36.7 C) (Oral)  Resp 14  SpO2 100%  LMP 06/23/2014 Physical Exam  Constitutional: She is oriented to person, place, and time. Vital signs are normal. She appears  well-developed and well-nourished. No distress.  HENT:  Head: Normocephalic and atraumatic.  Right Ear: External ear normal.  Left Ear: External ear normal.  Nose: Nose normal.  Mouth/Throat: Uvula is midline and mucous membranes are normal. Posterior oropharyngeal erythema (mild) present. No oropharyngeal exudate.  Eyes: Right eye exhibits discharge (scant). Right conjunctiva is injected (minimal).  Pulmonary/Chest: Effort normal. No respiratory distress.  Neurological: She is alert and oriented to person, place, and time. She has normal strength. Coordination normal.  Skin: Skin is warm and dry. No rash noted. She is not diaphoretic.  Psychiatric: She has a normal mood and affect. Judgment normal.  Nursing note and vitals reviewed.   ED Course  Procedures (including critical care time) Labs Review Labs Reviewed - No data to display  Imaging Review No results found.   MDM   1. Viral pharyngoconjunctivitis    Most likely viral. Treat symptomatically. The rapid strep was negative. Advised cool compresses for the eye, start eyedrops if this gets worse. Follow-up when necessary    Liam Graham, PA-C 07/06/14 1015

## 2014-07-06 NOTE — ED Notes (Signed)
Patient c/o possible right eye conjunctivitis onset this morning with mucous drainage. Patient also c/o sore throat. She denies fever. Patient is in NAD.

## 2014-07-06 NOTE — Discharge Instructions (Signed)
Use cool compresses on your eye as needed for symptoms. If this starts to get worse you may then start using the antibiotic eyedrop. Take ibuprofen or Tylenol, sore throat lozenges, gargle salt water to help with her sore throat.  Conjunctivitis Conjunctivitis is commonly called "pink eye." Conjunctivitis can be caused by bacterial or viral infection, allergies, or injuries. There is usually redness of the lining of the eye, itching, discomfort, and sometimes discharge. There may be deposits of matter along the eyelids. A viral infection usually causes a watery discharge, while a bacterial infection causes a yellowish, thick discharge. Pink eye is very contagious and spreads by direct contact. You may be given antibiotic eyedrops as part of your treatment. Before using your eye medicine, remove all drainage from the eye by washing gently with warm water and cotton balls. Continue to use the medication until you have awakened 2 mornings in a row without discharge from the eye. Do not rub your eye. This increases the irritation and helps spread infection. Use separate towels from other household members. Wash your hands with soap and water before and after touching your eyes. Use cold compresses to reduce pain and sunglasses to relieve irritation from light. Do not wear contact lenses or wear eye makeup until the infection is gone. SEEK MEDICAL CARE IF:   Your symptoms are not better after 3 days of treatment.  You have increased pain or trouble seeing.  The outer eyelids become very red or swollen. Document Released: 05/10/2004 Document Revised: 06/25/2011 Document Reviewed: 04/02/2005 [facility] Patient Information 2015 [place], [place]. This information is not intended to replace advice given to you by your health care provider. Make sure you discuss any questions you have with your health care provider.  Eye Drops Use eye drops as directed. It may be easier to have someone help you put the drops in  your eye. If you are alone, use the following instructions to help you.  Wash your hands before putting drops in your eyes.  Read the label and look at your medication. Check for any expiration date that may appear on the bottle or tube. Changes of color may be a warning that the medication is old or ineffective. This is especially true if the medication has become brown in color. If you have questions or concerns, call your caregiver. DROPS  Tilt your head back with the affected eye uppermost. Gently pull down on your lower lid. Do not pull up on the upper lid.  Look up. Place the dropper or bottle just over the edge of the lower lid near the white portion at the bottom of the eye. The goal is to have the drop go into the little sac formed by the lower lid and the bottom of the eye itself. Do not release the drop from a height of several inches over the eye. That will only serve to startle the person receiving the medicine when it lands and forces a blink.  Steady your hand in a comfortable manner. An example would be to hold the dropper or bottle between your thumb and index (pointing) finger. Lean your index finger against the brow.  Then, slowly and gently squeeze one drop of medication into your eye.  Once the medication has been applied, place your finger between the lower eyelid and the nose, pressing firmly against the nose for 5-10 seconds. This will slow the process of the eye drop entering the small canal that normally drains tears into the nose, and therefore  increases the exposure of the medicine to the eye for a few extra seconds. OINTMENTS  Look up. Place the tip of the tube just over the edge of the lower lid near the white portion at the bottom of the eye. The goal is to create a line of ointment along the inner surface of the eyelid in the little sac formed by the lower lid and the bottom of the eye itself.  Avoid touching the tube tip to your eyeball or eyelid. This avoids  contamination of the tube or the medicine in the tube.  Once a line of medicine has been created, hold the upper lid up and look down before releasing the upper lid. This will force the ointment to spread over the surface of the eye.  Your vision will be very blurry for a few minutes after applying an ointment properly. This is normal and will clear as you continue to blink. For this reason, it is best to apply ointments just before going to sleep, or at a time when you can rest your eyes for 5-10 minutes after applying the medication. GENERAL  Store your medicine in a cool, dry place after each use.  If you need a second medication, wait at least two minutes. This helps the first medication to be taken up (absorbed) by the eye.  If you have been instructed to use both an eye drop and an eye ointment, always apply the drop first and then the ointment 3-4 minutes afterward. Never put medications into the eye unless the label reads, "For Ophthalmic Use," "For Use In Eyes" or "Eye Drops." If you have questions, call your caregiver. Document Released: 07/09/2000 Document Revised: 08/17/2013 Document Reviewed: 09/14/2008 [facility] Patient Information 2015 [place], [place]. This information is not intended to replace advice given to you by your health care provider. Make sure you discuss any questions you have with your health care provider.  Pharyngitis Pharyngitis is redness, pain, and swelling (inflammation) of your pharynx.  CAUSES  Pharyngitis is usually caused by infection. Most of the time, these infections are from viruses (viral) and are part of a cold. However, sometimes pharyngitis is caused by bacteria (bacterial). Pharyngitis can also be caused by allergies. Viral pharyngitis may be spread from person to person by coughing, sneezing, and personal items or utensils (cups, forks, spoons, toothbrushes). Bacterial pharyngitis may be spread from person to person by more intimate contact, such as  kissing.  SIGNS AND SYMPTOMS  Symptoms of pharyngitis include:   Sore throat.   Tiredness (fatigue).   Low-grade fever.   Headache.  Joint pain and muscle aches.  Skin rashes.  Swollen lymph nodes.  Plaque-like film on throat or tonsils (often seen with bacterial pharyngitis). DIAGNOSIS  Your health care provider will ask you questions about your illness and your symptoms. Your medical history, along with a physical exam, is often all that is needed to diagnose pharyngitis. Sometimes, a rapid strep test is done. Other lab tests may also be done, depending on the suspected cause.  TREATMENT  Viral pharyngitis will usually get better in 3-4 days without the use of medicine. Bacterial pharyngitis is treated with medicines that kill germs (antibiotics).  HOME CARE INSTRUCTIONS   Drink enough water and fluids to keep your urine clear or pale yellow.   Only take over-the-counter or prescription medicines as directed by your health care provider:   If you are prescribed antibiotics, make sure you finish them even if you start to feel better.  Do not take aspirin.   Get lots of rest.   Gargle with 8 oz of salt water ( tsp of salt per 1 qt of water) as often as every 1-2 hours to soothe your throat.   Throat lozenges (if you are not at risk for choking) or sprays may be used to soothe your throat. SEEK MEDICAL CARE IF:   You have large, tender lumps in your neck.  You have a rash.  You cough up green, yellow-brown, or bloody spit. SEEK IMMEDIATE MEDICAL CARE IF:   Your neck becomes stiff.  You drool or are unable to swallow liquids.  You vomit or are unable to keep medicines or liquids down.  You have severe pain that does not go away with the use of recommended medicines.  You have trouble breathing (not caused by a stuffy nose). MAKE SURE YOU:   Understand these instructions.  Will watch your condition.  Will get help right away if you are not doing  well or get worse. Document Released: 04/02/2005 Document Revised: 01/21/2013 Document Reviewed: 12/08/2012 [facility] Patient Information 2015 [place], [place]. This information is not intended to replace advice given to you by your health care provider. Make sure you discuss any questions you have with your health care provider.

## 2014-07-07 ENCOUNTER — Other Ambulatory Visit: Payer: Self-pay | Admitting: Obstetrics & Gynecology

## 2014-07-07 DIAGNOSIS — N644 Mastodynia: Secondary | ICD-10-CM

## 2014-07-08 LAB — CULTURE, GROUP A STREP: Strep A Culture: NEGATIVE

## 2014-07-08 NOTE — Addendum Note (Signed)
Addended by: Truddie Crumble on: 07/08/2014 03:15 PM   Modules accepted: Orders

## 2014-07-13 ENCOUNTER — Telehealth: Payer: Self-pay

## 2014-07-13 ENCOUNTER — Other Ambulatory Visit: Payer: Self-pay | Admitting: *Deleted

## 2014-07-13 DIAGNOSIS — N644 Mastodynia: Secondary | ICD-10-CM

## 2014-07-13 MED ORDER — FUROSEMIDE 20 MG PO TABS: 20.0000 mg | ORAL_TABLET | Freq: Every day | ORAL | Status: DC

## 2014-07-13 NOTE — Telephone Encounter (Signed)
Patient called stating she tried to have her mammogram last week and was [facility] she has been having a pain in her breast-- they gave her breast center number to call and schedule appointment. Patient reports calling breast center and they cannot schedule until order is placed by Dr. Harolyn Rutherford. Patient would like order placed so she can schedule appointment. Attempted to contact patient to obtain more information on breast pain-- no answer. Left message stating we are returning your, please call clinic.

## 2014-07-13 NOTE — Telephone Encounter (Signed)
Pt called stating her Kcl was increased to 20 meq during visit to ED.  When you refilled it was for Kcl 10 meq.  Please clarify for the patient.

## 2014-07-14 ENCOUNTER — Other Ambulatory Visit (INDEPENDENT_AMBULATORY_CARE_PROVIDER_SITE_OTHER): Payer: Medicare Other

## 2014-07-14 ENCOUNTER — Telehealth: Payer: Self-pay | Admitting: *Deleted

## 2014-07-14 DIAGNOSIS — Z77011 Contact with and (suspected) exposure to lead: Secondary | ICD-10-CM | POA: Diagnosis not present

## 2014-07-14 NOTE — Addendum Note (Signed)
Addended by: Truddie Crumble on: 07/14/2014 04:35 PM   Modules accepted: Orders

## 2014-07-14 NOTE — Telephone Encounter (Signed)
Called pt and informed her that the order for her Mammogram has been placed and she may now schedule her appt @ the [facility]. I recommended that she have the Mammogram prior to her appt with Dr. Harolyn Rutherford on 4/27 so that she would be able to discuss results with her at that time and answer any questions. Pt agreed and voiced understanding.

## 2014-07-14 NOTE — Telephone Encounter (Signed)
Spoke w/ pt, explained dr Caroline More answer, pt is agreeable and understands at this time, she is reassured and shown to lab

## 2014-07-14 NOTE — Telephone Encounter (Signed)
Pt presented to clinic lab today to determine lead levels due to living in an old house and possibly having lead seepage from the water pipes, she now states she was told by plumbers that she could also be consuming copper because the pipes are corroded, she would like to have blood work for all metals at this point, is this possible? Could you add the orders if it is?

## 2014-07-14 NOTE — Telephone Encounter (Signed)
If the patient lives in a very old house and is concerned about lead, it is not unreasonable to check a lead level for her peace of mind given all of the recent media from [place], [place].  Copper solder is preferred to lead solder (has replaced lead solder) for that very reason.  No need to check a copper level or other heavy metal levels if she is otherwise asymptomatic (without neuropathy symptoms, etc).

## 2014-07-16 DIAGNOSIS — F319 Bipolar disorder, unspecified: Secondary | ICD-10-CM | POA: Diagnosis not present

## 2014-07-16 LAB — LEAD, BLOOD

## 2014-07-20 ENCOUNTER — Ambulatory Visit
Admission: RE | Admit: 2014-07-20 | Discharge: 2014-07-20 | Disposition: A | Discharge status: Home / Self Care | Payer: Medicare Other | Source: Ambulatory Visit | Attending: Obstetrics & Gynecology | Admitting: Obstetrics & Gynecology

## 2014-07-20 DIAGNOSIS — N644 Mastodynia: Secondary | ICD-10-CM | POA: Diagnosis not present

## 2014-07-21 DIAGNOSIS — F319 Bipolar disorder, unspecified: Secondary | ICD-10-CM | POA: Diagnosis not present

## 2014-07-22 ENCOUNTER — Emergency Department (HOSPITAL_COMMUNITY)
Admission: EM | Admit: 2014-07-22 | Discharge: 2014-07-23 | Discharge status: Against Medical Advice | Payer: Medicare Other | Attending: Emergency Medicine | Admitting: Emergency Medicine

## 2014-07-22 ENCOUNTER — Encounter (HOSPITAL_COMMUNITY): Payer: Self-pay | Admitting: Emergency Medicine

## 2014-07-22 DIAGNOSIS — I2510 Atherosclerotic heart disease of native coronary artery without angina pectoris: Secondary | ICD-10-CM | POA: Diagnosis not present

## 2014-07-22 DIAGNOSIS — Z720 Tobacco use: Secondary | ICD-10-CM | POA: Diagnosis not present

## 2014-07-22 DIAGNOSIS — E119 Type 2 diabetes mellitus without complications: Secondary | ICD-10-CM | POA: Insufficient documentation

## 2014-07-22 DIAGNOSIS — I10 Essential (primary) hypertension: Secondary | ICD-10-CM | POA: Diagnosis not present

## 2014-07-22 DIAGNOSIS — Z5321 Procedure and treatment not carried out due to patient leaving prior to being seen by health care provider: Secondary | ICD-10-CM | POA: Diagnosis not present

## 2014-07-22 DIAGNOSIS — J029 Acute pharyngitis, unspecified: Secondary | ICD-10-CM | POA: Diagnosis present

## 2014-07-22 NOTE — ED Notes (Signed)
Pt c/o sore throat, cough and sob since Easter. Pt was diagnosed with viral infection.

## 2014-07-23 ENCOUNTER — Encounter (HOSPITAL_COMMUNITY): Payer: Self-pay | Admitting: Emergency Medicine

## 2014-07-23 ENCOUNTER — Emergency Department (INDEPENDENT_AMBULATORY_CARE_PROVIDER_SITE_OTHER)
Admission: EM | Admit: 2014-07-23 | Discharge: 2014-07-23 | Disposition: A | Discharge status: Home / Self Care | Payer: Medicare Other | Source: Home / Self Care | Attending: Family Medicine | Admitting: Family Medicine

## 2014-07-23 DIAGNOSIS — J018 Other acute sinusitis: Secondary | ICD-10-CM

## 2014-07-23 LAB — POCT RAPID STREP A: Streptococcus, Group A Screen (Direct): NEGATIVE

## 2014-07-23 MED ORDER — DOXYCYCLINE HYCLATE 100 MG PO CAPS: 100.0000 mg | ORAL_CAPSULE | Freq: Two times a day (BID) | ORAL | Status: DC

## 2014-07-23 NOTE — ED Notes (Signed)
C/o persistent left ear pain and ST onset 2 weeks Seen here on 3/22 for similar sx  Denies fevers, chills Alert, no signs of acute distress.

## 2014-07-23 NOTE — Discharge Instructions (Signed)

## 2014-07-23 NOTE — ED Notes (Signed)
Pt c/o long wait time and spoke with nursing supervisor and walked out

## 2014-07-23 NOTE — ED Provider Notes (Signed)
CSN: [id number]     Arrival date & time 07/23/14  1204 History   First MD Initiated Contact with Patient 07/23/14 1258     Chief Complaint  Patient presents with  . Otalgia  . Sore Throat   (Consider location/radiation/quality/duration/timing/severity/associated sxs/prior Treatment) HPI           49 year old female presents complaining of sore throat, left ear pain, sinus pain or pressure. She was seen here to have weeks ago for similar symptoms on the day that the symptoms started and was diagnosed with a viral pharyngeal conjunctivitis. The conjunctivitis portion got better for the sore throat has persisted now she is experiencing sinus pain and pressure. She is taking over-the-counter medications with temporary relief of her symptoms but the symptoms keep coming back. No chest pain or shortness of breath. No fever  Past Medical History  Diagnosis Date  . Arteriosclerotic cardiovascular disease (ASCVD)     Minimal at cath in [facility].stress nuclear study in 8/08 with nl EF; neg stress echo in 2010  . Diabetes mellitus, type 2 2000    Onset in 2000; no insulin  . Hyperlipidemia   . Hypertension `    during treatment with Geodon  . Gastroesophageal reflux disease     Schatzki's ring  . Anemia, iron deficiency   . Alcohol abuse   . Depression   . Community acquired pneumonia 01/03/10, 05/2010, 04/2012    2011; with pleural effusion-[facility] acute resp failure; intubated in Jan 2014 (HMPV pneumonia)  . Obesity   . Schizoaffective disorder     requiring multiple psychiatric admissions  . Dysphagia   . Diastolic dysfunction     grade 2 per echo 2011  . Pulmonary hypertension 05/02/2012    Patient needs repeat echo in 06/2012   . History of alcohol abuse 07/22/2007    Qualifier: Diagnosis of  By: Lenn Cal     Past Surgical History  Procedure Laterality Date  . Dilation and curettage, diagnostic / therapeutic  1992  . Esophagogastroduodenoscopy  09/16/08    Dr.  Trevor Iha hiatal hernia/excoriations involving the cardia and mucosa consistent with trauma, antral erosions  of linear petechiae ? gastritis versus early gastric antral vascular  ectasia.Marland Kitchen biopsy showed reactive gastropathy. No H. pylori.  . Esophagogastroduodenoscopy  09/2007    Dr. Evalee Mutton ring, dilated to 49 French Maloney dilator, small hiatal hernia, antral erosions, biopsies reactive gastropathy.  Azzie Almas dilation  07/17/2011    [facility] sedation-->distal esophageal stricture s/p dilation, chronic gastritis, multiple ulcers in stomach. no h.pylori  . Colonoscopy  01/2006    internal hemorrhoids  . Colonoscopy  01/10/2012    Dr. Rourk:Single anal canal hemorrhoidal tag likely source of  trivial hematochezia; right-sided colonic diverticulosis  . Esophagogastroduodenoscopy (egd) with propofol N/A 12/17/2013    LYY:TKPTWSF antral erosions and petechiae. Small hiatal hernia. No endoscopic explanation for patient's symptoms   Family History  Problem Relation Age of Onset  . Colon cancer Other   . Hypertension Mother   . Stroke Father     deceased at age 76  . Heart disease Sister   . Anesthesia problems Neg Hx   . Hypotension Neg Hx   . Malignant hyperthermia Neg Hx   . Pseudochol deficiency Neg Hx    History  Substance Use Topics  . Smoking status: Current Every Day Smoker -- 1.00 packs/day for 30 years    Types: Cigarettes    Start date: 05/18/2012  . Smokeless tobacco: Never Used  Comment: cutting back,  . Alcohol Use: No     Comment: hx of ETOH abuse   OB History    Gravida Para Term Preterm AB TAB SAB Ectopic Multiple Living   3 2 2  1 1    2      Review of Systems  HENT: Positive for ear pain, sinus pressure and sore throat.   All other systems reviewed and are negative.   Allergies  Cephalexin; Metronidazole; Orange; Shrimp; Penicillins; Sulfonamide derivatives; Glipizide; and Sulfamethoxazole-trimethoprim  Home Medications   Prior to Admission  medications   Medication Sig Start Date End Date Taking? Authorizing Provider  acetaminophen (TYLENOL) 500 MG tablet Take 1,000 mg by mouth every 6 (six) hours as needed for mild pain.    Historical Provider, MD  albuterol (PROVENTIL HFA;VENTOLIN HFA) 108 (90 BASE) MCG/ACT inhaler Inhale 2 puffs into the lungs 3 (three) times daily as needed for wheezing or shortness of breath. 10/27/12   Rigoberto Noel, MD  albuterol (PROVENTIL) (2.5 MG/3ML) 0.083% nebulizer solution Take 3 mLs (2.5 mg total) by nebulization every 6 (six) hours as needed for wheezing or shortness of breath. 03/17/14   Otho Bellows, MD  buPROPion (WELLBUTRIN XL) 150 MG 24 hr tablet Take 150 mg by mouth daily. prescribed by [facility] for smoking cessation.    Amedeo Kinsman, NP  doxycycline (VIBRAMYCIN) 100 MG capsule Take 1 capsule (100 mg total) by mouth 2 (two) times daily. 07/23/14   Liam Graham, PA-C  esomeprazole (NEXIUM) 40 MG capsule Take 1 capsule (40 mg total) by mouth 2 (two) times daily before a meal. 05/05/14   Norman Herrlich, MD  ferrous sulfate 325 (65 FE) MG tablet Take 1 tablet (325 mg total) by mouth 3 (three) times daily with meals. 05/05/14 05/05/15  Norman Herrlich, MD  furosemide (LASIX) 20 MG tablet Take 1 tablet (20 mg total) by mouth daily. 07/13/14   Norman Herrlich, MD  ibuprofen (ADVIL,MOTRIN) 200 MG tablet Take 400 mg by mouth every 6 (six) hours as needed for mild pain.    Historical Provider, MD  Insulin Glargine (LANTUS SOLOSTAR) 100 UNIT/ML Solostar Pen Inject 22 Units into the skin at bedtime. 12/07/13   Juluis Mire, MD  lisinopril (PRINIVIL,ZESTRIL) 40 MG tablet Take 1 tablet (40 mg total) by mouth daily. 05/05/14   Norman Herrlich, MD  loperamide (IMODIUM A-D) 2 MG tablet Take 1 tablet (2 mg total) by mouth 4 (four) times daily as needed for diarrhea or loose stools. 06/23/14   Norman Herrlich, MD  lovastatin (MEVACOR) 20 MG tablet Take 1 tablet (20 mg total) by mouth at bedtime. 05/05/14   Norman Herrlich, MD   meclizine (ANTIVERT) 50 MG tablet Take 1 tablet (50 mg total) by mouth 3 (three) times daily as needed for dizziness. 06/10/14   Daleen Bo, MD  metFORMIN (GLUCOPHAGE) 1000 MG tablet TAKE (1) TABLET BY MOUTH TWICE DAILY WITH MEALS. 05/05/14   Norman Herrlich, MD  metoprolol (LOPRESSOR) 100 MG tablet Take 1 tablet (100 mg total) by mouth 2 (two) times daily. 05/05/14   Norman Herrlich, MD  mometasone (NASONEX) 50 MCG/ACT nasal spray Place 2 sprays into the nose 2 (two) times daily. 02/22/14   Cresenciano Genre, MD  ofloxacin (OCUFLOX) 0.3 % ophthalmic solution Place 1 drop into the right eye 4 (four) times daily. 07/06/14   Freeman Caldron Ekansh Sherk, PA-C  ondansetron (ZOFRAN ODT) 4 MG disintegrating tablet 4mg  ODT q4 hours  prn nausea/vomit 05/04/14   Milton Ferguson, MD  potassium chloride (K-DUR) 10 MEQ tablet TAKE 1 TABLET BY MOUTH ONCE DAILY. 07/05/14   Norman Herrlich, MD  potassium chloride SA (K-DUR,KLOR-CON) 20 MEQ tablet Take 1 tablet (20 mEq total) by mouth 2 (two) times daily. 09[birth year]18[birth year]76   Delora Fuel, MD  promethazine (PHENERGAN) 25 MG suppository Place 1 suppository (25 mg total) rectally every 6 (six) hours as needed for nausea or vomiting. 05/05/14   Milton Ferguson, MD  promethazine (PHENERGAN) 25 MG tablet Take 1 tablet (25 mg total) by mouth every 6 (six) hours as needed for nausea. 03/28/11 04/04/11  Fredia Sorrow, MD  promethazine (PHENERGAN) 25 MG tablet Take 1 tablet (25 mg total) by mouth every 6 (six) hours as needed for nausea. 04/06/11 04/13/11  Vivi Ferns, MD  saccharomyces boulardii (FLORASTOR) 250 MG capsule Take 1 capsule (250 mg total) by mouth 2 (two) times daily. 06/23/14   Norman Herrlich, MD  sucralfate (CARAFATE) 1 G tablet Take 1 g by mouth 4 (four) times daily -  with meals and at bedtime.    Historical Provider, MD  ziprasidone (GEODON) 80 MG capsule Take 1 capsule (80 mg total) by mouth 2 (two) times daily with a meal. 03/26/12   Patrecia Pour, NP   BP 187/102 mmHg  Pulse 75  Temp(Src)  99.1 F (37.3 C) (Oral)  Resp 16  SpO2 96%  LMP 06/23/2014 Physical Exam  Constitutional: She is oriented to person, place, and time. Vital signs are normal. She appears well-developed and well-nourished. No distress.  HENT:  Head: Normocephalic and atraumatic.  Right Ear: External ear normal.  Left Ear: External ear normal.  Nose: Right sinus exhibits maxillary sinus tenderness. Right sinus exhibits no frontal sinus tenderness. Left sinus exhibits maxillary sinus tenderness. Left sinus exhibits no frontal sinus tenderness.  Mouth/Throat: Oropharynx is clear and moist. No oropharyngeal exudate.  Eyes: Conjunctivae are normal.  Neck: Normal range of motion. Neck supple.  Cardiovascular: Normal rate, regular rhythm and normal heart sounds.   Pulmonary/Chest: Effort normal and breath sounds normal. No respiratory distress.  Lymphadenopathy:    She has no cervical adenopathy.  Neurological: She is alert and oriented to person, place, and time. She has normal strength. Coordination normal.  Skin: Skin is warm and dry. No rash noted. She is not diaphoretic.  Psychiatric: She has a normal mood and affect. Judgment normal.  Nursing note and vitals reviewed.   ED Course  Procedures (including critical care time) Labs Review Labs Reviewed  POCT RAPID STREP A ([facility] URG CARE ONLY)    Imaging Review No results found.   MDM   1. Other acute sinusitis    Continue symptomatic management, add doxycycline. Follow-up when necessary  Meds ordered this encounter  Medications  . doxycycline (VIBRAMYCIN) 100 MG capsule    Sig: Take 1 capsule (100 mg total) by mouth 2 (two) times daily.    Dispense:  20 capsule    Refill:  0       Liam Graham, PA-C 07/23/14 2135

## 2014-07-26 ENCOUNTER — Encounter: Payer: Self-pay | Admitting: Internal Medicine

## 2014-07-26 ENCOUNTER — Ambulatory Visit (INDEPENDENT_AMBULATORY_CARE_PROVIDER_SITE_OTHER): Payer: Medicare Other | Admitting: Internal Medicine

## 2014-07-26 VITALS — BP 142/89 | HR 93 | Temp 98.2°F | Ht 64.0 in | Wt 208.6 lb

## 2014-07-26 DIAGNOSIS — J0120 Acute ethmoidal sinusitis, unspecified: Secondary | ICD-10-CM | POA: Diagnosis present

## 2014-07-26 DIAGNOSIS — R112 Nausea with vomiting, unspecified: Secondary | ICD-10-CM | POA: Diagnosis not present

## 2014-07-26 LAB — CULTURE, GROUP A STREP: Strep A Culture: NEGATIVE

## 2014-07-26 NOTE — Assessment & Plan Note (Addendum)
Chronic N/V x several months.  She has been on Fe and metformin for a long time and does not feel symptoms are related to these meds.  She feels the N/V is more frequent when she takes the abx.  Abdominal exam unremarkable and recent labs unrevealing.  She reports early satiety and has peripheral neuropathy so gastroparesis due to DM is a possible cause of her N/V (though A1c is only 7.6). - gastric emptying study ordered to evaluate for gastroparesis - continue Nexium for GERD

## 2014-07-26 NOTE — Patient Instructions (Signed)
1. I am sending you for CT of your sinuses.  Please keep your appointment with Ear, Nose and Throat next week.  I am also sending you for a gastric emptying study because you have been having persistent vomiting.   2. Please take all medications as prescribed.    3. If you have worsening of your symptoms or new symptoms arise, please call the clinic ([phone]), or go to the ER immediately if symptoms are severe.

## 2014-07-26 NOTE — Progress Notes (Signed)
Case discussed with Dr. Wilson soon after the resident saw the patient. We reviewed the resident's history and exam and pertinent patient test results. I agree with the assessment, diagnosis, and plan of care documented in the resident's note. 

## 2014-07-26 NOTE — Progress Notes (Signed)
   Subjective:    Patient ID: [name], female    DOB: 07-Jan-1966, 49 y.o.   MRN: [id number]  HPI Comments: [name] is a 49 year old with PMH as below here for sinus c/o x 3 weeks.  Please see problem based charting A&P.  Sinusitis Associated symptoms include congestion, coughing, shortness of breath, sinus pressure and sneezing. Pertinent negatives include no chills, ear pain or sore throat.      Review of Systems  Constitutional: Positive for appetite change. Negative for fever and chills.       Decreased appetite for several months.  HENT: Positive for congestion, rhinorrhea, sinus pressure and sneezing. Negative for ear discharge, ear pain, hearing loss, postnasal drip and sore throat.   Respiratory: Positive for cough and shortness of breath.        Dry cough x 1 week.  Cardiovascular: Negative for chest pain and palpitations.  Gastrointestinal: Positive for nausea, vomiting and diarrhea. Negative for abdominal pain.       Vomiting 2-3 x per weeks sometimes x months.  Genitourinary: Negative for dysuria and hematuria.       Filed Vitals:   07/26/14 1041  BP: 142/89  Pulse: 93  Temp: 98.2 F (36.8 C)  TempSrc: Oral  Height: 5\' 4"  (1.626 m)  Weight: 208 lb 9.6 oz (94.62 kg)  SpO2: 100%    Objective:   Physical Exam  Constitutional: She is oriented to person, place, and time. She appears well-developed. No distress.  HENT:  Head: Normocephalic and atraumatic.  Right Ear: External ear normal.  Left Ear: External ear normal.  Mouth/Throat: Oropharynx is clear and moist. No oropharyngeal exudate.  Mild TTP at ethmoid sinsuses, no maxillary TTP.  TMs normal.  No mastoid, tragus or pinna tenderness.  Eyes: EOM are normal. Pupils are equal, round, and reactive to light.  Neck: Neck supple.  Cardiovascular: Normal rate, regular rhythm and normal heart sounds.  Exam reveals no gallop and no friction rub.   No murmur heard. Pulmonary/Chest: Effort normal and  breath sounds normal. No respiratory distress. She has no wheezes. She has no rales.  Abdominal: Soft. Bowel sounds are normal. She exhibits no distension and no mass. There is no tenderness. There is no rebound and no guarding.  Musculoskeletal: Normal range of motion. She exhibits no edema or tenderness.  Lymphadenopathy:    She has no cervical adenopathy.  Neurological: She is alert and oriented to person, place, and time. No cranial nerve deficit.  Skin: Skin is warm. She is not diaphoretic.  Psychiatric: She has a normal mood and affect. Her behavior is normal.  Vitals reviewed.         Assessment & Plan:  Please see problem based charting A&P.

## 2014-07-26 NOTE — Assessment & Plan Note (Signed)
She continues to have sinus discomfort but is afebrile, non-toxic appearing and there is no purulent rhinorrhea.  The nasal mucose is red and edematous but not obstructed.  There is no periorbital swelling, vision changes or necrotic tissue on exam to suggest mucor.  She has been prescribed three different antibiotics (by different providers) since symptoms began 1 month ago but she has not finished any of them due to worsening N/V when she takes the abx.   - continue Claritin and Nasonex - Doxy was prescribed by UC doctor 3 days ago, I advised she try and take this medication with food - CT maxillofacial/sinus - she is scheduled to return to ENT next week - return to clinic prn or for worsening symptoms

## 2014-07-27 ENCOUNTER — Other Ambulatory Visit: Payer: Self-pay | Admitting: *Deleted

## 2014-07-27 MED ORDER — POTASSIUM CHLORIDE CRYS ER 20 MEQ PO TBCR: 20.0000 meq | EXTENDED_RELEASE_TABLET | Freq: Two times a day (BID) | ORAL | Status: DC

## 2014-07-27 NOTE — Telephone Encounter (Signed)
Called to pharm 

## 2014-07-29 ENCOUNTER — Ambulatory Visit (HOSPITAL_COMMUNITY)
Admission: RE | Admit: 2014-07-29 | Discharge: 2014-07-29 | Disposition: A | Discharge status: Home / Self Care | Payer: Medicare Other | Source: Ambulatory Visit | Attending: Internal Medicine | Admitting: Internal Medicine

## 2014-07-29 DIAGNOSIS — J3489 Other specified disorders of nose and nasal sinuses: Secondary | ICD-10-CM | POA: Diagnosis not present

## 2014-07-29 DIAGNOSIS — J0120 Acute ethmoidal sinusitis, unspecified: Secondary | ICD-10-CM | POA: Diagnosis not present

## 2014-08-02 ENCOUNTER — Encounter (HOSPITAL_COMMUNITY): Payer: Self-pay | Admitting: Emergency Medicine

## 2014-08-02 ENCOUNTER — Telehealth: Payer: Self-pay | Admitting: *Deleted

## 2014-08-02 ENCOUNTER — Emergency Department (INDEPENDENT_AMBULATORY_CARE_PROVIDER_SITE_OTHER)
Admission: EM | Admit: 2014-08-02 | Discharge: 2014-08-02 | Disposition: A | Discharge status: Home / Self Care | Payer: Medicare Other | Source: Home / Self Care | Attending: Emergency Medicine | Admitting: Emergency Medicine

## 2014-08-02 DIAGNOSIS — J301 Allergic rhinitis due to pollen: Secondary | ICD-10-CM | POA: Diagnosis not present

## 2014-08-02 DIAGNOSIS — S8992XA Unspecified injury of left lower leg, initial encounter: Secondary | ICD-10-CM | POA: Diagnosis not present

## 2014-08-02 DIAGNOSIS — R51 Headache: Secondary | ICD-10-CM | POA: Diagnosis not present

## 2014-08-02 MED ORDER — HYDROCODONE-ACETAMINOPHEN 5-325 MG PO TABS: 1.0000 | ORAL_TABLET | Freq: Four times a day (QID) | ORAL | Status: DC | PRN

## 2014-08-02 MED ORDER — MELOXICAM 15 MG PO TABS
15.0000 mg | ORAL_TABLET | Freq: Every day | ORAL | Status: DC
Start: 2014-08-02 — End: 2014-11-26

## 2014-08-02 NOTE — ED Notes (Signed)
Reports losing balance and falling on hard wood floors, yesterday 4/17.  Reports pain in left knee.

## 2014-08-02 NOTE — ED Provider Notes (Addendum)
CSN: [id number]     Arrival date & time 08/02/14  0272 History   First MD Initiated Contact with Patient 08/02/14 1003     Chief Complaint  Patient presents with  . Knee Pain   (Consider location/radiation/quality/duration/timing/severity/associated sxs/prior Treatment) HPI  She is a 49 year old woman here for evaluation of left knee pain. She states she was walking over a baby gate last night when she lost her balance and fell, landing on her left knee. When she stood up she felt a pop in the knee. Pain is worse when she weight bears and twists on the knee. No locking. She applied an ice pack last night. She did take some Advil last night which helped the pain. She is not supposed to take NSAIDs as she has stomach ulcers.  Past Medical History  Diagnosis Date  . Arteriosclerotic cardiovascular disease (ASCVD)     Minimal at cath in [facility].stress nuclear study in 8/08 with nl EF; neg stress echo in 2010  . Diabetes mellitus, type 2 2000    Onset in 2000; no insulin  . Hyperlipidemia   . Hypertension `    during treatment with Geodon  . Gastroesophageal reflux disease     Schatzki's ring  . Anemia, iron deficiency   . Alcohol abuse   . Depression   . Community acquired pneumonia 01/03/10, 05/2010, 04/2012    2011; with pleural effusion-[facility] acute resp failure; intubated in Jan 2014 (HMPV pneumonia)  . Obesity   . Schizoaffective disorder     requiring multiple psychiatric admissions  . Dysphagia   . Diastolic dysfunction     grade 2 per echo 2011  . Pulmonary hypertension 05/02/2012    Patient needs repeat echo in 06/2012   . History of alcohol abuse 07/22/2007    Qualifier: Diagnosis of  By: Lenn Cal     Past Surgical History  Procedure Laterality Date  . Dilation and curettage, diagnostic / therapeutic  1992  . Esophagogastroduodenoscopy  09/16/08    Dr. Trevor Iha hiatal hernia/excoriations involving the cardia and mucosa consistent with trauma, antral  erosions  of linear petechiae ? gastritis versus early gastric antral vascular  ectasia.Marland Kitchen biopsy showed reactive gastropathy. No H. pylori.  . Esophagogastroduodenoscopy  09/2007    Dr. Evalee Mutton ring, dilated to 64 French Maloney dilator, small hiatal hernia, antral erosions, biopsies reactive gastropathy.  Azzie Almas dilation  07/17/2011    [facility] sedation-->distal esophageal stricture s/p dilation, chronic gastritis, multiple ulcers in stomach. no h.pylori  . Colonoscopy  01/2006    internal hemorrhoids  . Colonoscopy  01/10/2012    Dr. Rourk:Single anal canal hemorrhoidal tag likely source of  trivial hematochezia; right-sided colonic diverticulosis  . Esophagogastroduodenoscopy (egd) with propofol N/A 12/17/2013    ZDG:UYQIHKV antral erosions and petechiae. Small hiatal hernia. No endoscopic explanation for patient's symptoms   Family History  Problem Relation Age of Onset  . Colon cancer Other   . Hypertension Mother   . Stroke Father     deceased at age 52  . Heart disease Sister   . Anesthesia problems Neg Hx   . Hypotension Neg Hx   . Malignant hyperthermia Neg Hx   . Pseudochol deficiency Neg Hx    History  Substance Use Topics  . Smoking status: Current Every Day Smoker -- 1.00 packs/day for 30 years    Types: Cigarettes    Start date: 05/18/2012  . Smokeless tobacco: Never Used     Comment: cutting back,  .  Alcohol Use: No     Comment: hx of ETOH abuse   OB History    Gravida Para Term Preterm AB TAB SAB Ectopic Multiple Living   3 2 2  1 1    2      Review of Systems As in history of present illness Allergies  Cephalexin; Metronidazole; Orange; Shrimp; Penicillins; Sulfonamide derivatives; Glipizide; and Sulfamethoxazole-trimethoprim  Home Medications   Prior to Admission medications   Medication Sig Start Date End Date Taking? Authorizing Provider  acetaminophen (TYLENOL) 500 MG tablet Take 1,000 mg by mouth every 6 (six) hours as needed for mild pain.     Historical Provider, MD  albuterol (PROVENTIL HFA;VENTOLIN HFA) 108 (90 BASE) MCG/ACT inhaler Inhale 2 puffs into the lungs 3 (three) times daily as needed for wheezing or shortness of breath. 10/27/12   Rigoberto Noel, MD  albuterol (PROVENTIL) (2.5 MG/3ML) 0.083% nebulizer solution Take 3 mLs (2.5 mg total) by nebulization every 6 (six) hours as needed for wheezing or shortness of breath. 03/17/14   Otho Bellows, MD  doxycycline (VIBRAMYCIN) 100 MG capsule Take 1 capsule (100 mg total) by mouth 2 (two) times daily. Patient not taking: Reported on 07/26/2014 07/23/14   Liam Graham, PA-C  esomeprazole (NEXIUM) 40 MG capsule Take 1 capsule (40 mg total) by mouth 2 (two) times daily before a meal. 05/05/14   Norman Herrlich, MD  ferrous sulfate 325 (65 FE) MG tablet Take 1 tablet (325 mg total) by mouth 3 (three) times daily with meals. 05/05/14 05/05/15  Norman Herrlich, MD  furosemide (LASIX) 20 MG tablet Take 1 tablet (20 mg total) by mouth daily. 07/13/14   Norman Herrlich, MD  ibuprofen (ADVIL,MOTRIN) 200 MG tablet Take 400 mg by mouth every 6 (six) hours as needed for mild pain.    Historical Provider, MD  Insulin Glargine (LANTUS SOLOSTAR) 100 UNIT/ML Solostar Pen Inject 22 Units into the skin at bedtime. 12/07/13   Juluis Mire, MD  lisinopril (PRINIVIL,ZESTRIL) 40 MG tablet Take 1 tablet (40 mg total) by mouth daily. 05/05/14   Norman Herrlich, MD  loperamide (IMODIUM A-D) 2 MG tablet Take 1 tablet (2 mg total) by mouth 4 (four) times daily as needed for diarrhea or loose stools. Patient not taking: Reported on 07/26/2014 06/23/14   Norman Herrlich, MD  loratadine (CLARITIN) 10 MG tablet Take 10 mg by mouth daily.    Historical Provider, MD  lovastatin (MEVACOR) 20 MG tablet Take 1 tablet (20 mg total) by mouth at bedtime. 05/05/14   Norman Herrlich, MD  meclizine (ANTIVERT) 50 MG tablet Take 1 tablet (50 mg total) by mouth 3 (three) times daily as needed for dizziness. 06/10/14   Daleen Bo, MD   meloxicam (MOBIC) 15 MG tablet Take 1 tablet (15 mg total) by mouth daily. For 5 days 08/02/14   Melony Overly, MD  metFORMIN (GLUCOPHAGE) 1000 MG tablet TAKE (1) TABLET BY MOUTH TWICE DAILY WITH MEALS. 05/05/14   Norman Herrlich, MD  metoprolol (LOPRESSOR) 100 MG tablet Take 1 tablet (100 mg total) by mouth 2 (two) times daily. 05/05/14   Norman Herrlich, MD  mometasone (NASONEX) 50 MCG/ACT nasal spray Place 2 sprays into the nose 2 (two) times daily. 02/22/14   Cresenciano Genre, MD  ofloxacin (OCUFLOX) 0.3 % ophthalmic solution Place 1 drop into the right eye 4 (four) times daily. Patient not taking: Reported on 07/26/2014 07/06/14   Liam Graham, PA-C  ondansetron (ZOFRAN ODT) 4 MG disintegrating tablet 4mg  ODT q4 hours prn nausea/vomit 05/04/14   Milton Ferguson, MD  potassium chloride SA (K-DUR,KLOR-CON) 20 MEQ tablet Take 1 tablet (20 mEq total) by mouth 2 (two) times daily. 07/27/14   Norman Herrlich, MD  saccharomyces boulardii (FLORASTOR) 250 MG capsule Take 1 capsule (250 mg total) by mouth 2 (two) times daily. 06/23/14   Norman Herrlich, MD  sucralfate (CARAFATE) 1 G tablet Take 1 g by mouth 4 (four) times daily -  with meals and at bedtime.    Historical Provider, MD  ziprasidone (GEODON) 80 MG capsule Take 1 capsule (80 mg total) by mouth 2 (two) times daily with a meal. 03/26/12   Patrecia Pour, NP   BP 180/102 mmHg  Pulse 76  Temp(Src) 98.6 F (37 C) (Oral)  Resp 16  SpO2 97%  LMP 07/29/2014 Physical Exam  Constitutional: She is oriented to person, place, and time. She appears well-developed and well-nourished. No distress.  Cardiovascular: Normal rate.   Pulmonary/Chest: Effort normal.  Musculoskeletal:  Left knee: She has some bruising and soft tissue swelling just lateral to the patellar tendon. No appreciable joint effusion. She is tender along the medial joint line.  Neurological: She is alert and oriented to person, place, and time.    ED Course  Procedures (including critical  care time) Labs Review Labs Reviewed - No data to display  Imaging Review No results found.   MDM   1. Left knee injury, initial encounter    I suspect she may have torn her medial meniscus. Knee sleeve given. Conservative management with a five-day course of meloxicam and frequent icing. Norco 5-325 milligrams, #15 tablets given for severe pain. If no improvement in 1 week, she will follow-up with her PCP to discuss a steroid injection.    Melony Overly, MD 08/02/14 [place], MD 08/02/14 1100

## 2014-08-02 NOTE — Telephone Encounter (Signed)
Call to patient with scheduled appointment for Gastric Emptying on 08/19/2014 at 9:00 AM.  Patient to be NPO for 4-6 hours prior to procedure.  No stomach meds 4-6 hours prior to the procedure.  Patient is to be at [facility] at 8:45 AM to check- in.  Patient voiced understanding of the plan.  Sander Nephew, RN 08/02/2014 3:08 PM

## 2014-08-02 NOTE — Discharge Instructions (Signed)
You have strained knee. You may also have torn your meniscus. Take meloxicam 1 pill daily for 5 days. Make sure you take your Nexium while on this medication. Apply ice to the knee as often as you can. Wear the brace during the day. If no improvement in 1 week, please follow-up with your PCP to discuss a steroid injection.

## 2014-08-09 ENCOUNTER — Other Ambulatory Visit (HOSPITAL_COMMUNITY): Payer: Medicare Other

## 2014-08-11 ENCOUNTER — Ambulatory Visit: Payer: Self-pay | Admitting: Obstetrics & Gynecology

## 2014-08-12 ENCOUNTER — Encounter: Payer: Self-pay | Admitting: Dietician

## 2014-08-12 ENCOUNTER — Ambulatory Visit (INDEPENDENT_AMBULATORY_CARE_PROVIDER_SITE_OTHER): Payer: Medicare Other | Admitting: Neurology

## 2014-08-12 ENCOUNTER — Encounter: Payer: Self-pay | Admitting: Pulmonary Disease

## 2014-08-12 ENCOUNTER — Ambulatory Visit (INDEPENDENT_AMBULATORY_CARE_PROVIDER_SITE_OTHER): Payer: Medicare Other | Admitting: Pulmonary Disease

## 2014-08-12 ENCOUNTER — Encounter: Payer: Self-pay | Admitting: Neurology

## 2014-08-12 VITALS — BP 171/96 | HR 76 | Temp 97.7°F | Ht 64.0 in | Wt 211.0 lb

## 2014-08-12 VITALS — BP 171/84 | HR 87 | Temp 98.0°F | Wt 213.6 lb

## 2014-08-12 DIAGNOSIS — R51 Headache with orthostatic component, not elsewhere classified: Secondary | ICD-10-CM | POA: Insufficient documentation

## 2014-08-12 DIAGNOSIS — J069 Acute upper respiratory infection, unspecified: Secondary | ICD-10-CM | POA: Diagnosis not present

## 2014-08-12 DIAGNOSIS — H539 Unspecified visual disturbance: Secondary | ICD-10-CM

## 2014-08-12 DIAGNOSIS — R519 Headache, unspecified: Secondary | ICD-10-CM

## 2014-08-12 DIAGNOSIS — H93A9 Pulsatile tinnitus, unspecified ear: Secondary | ICD-10-CM | POA: Insufficient documentation

## 2014-08-12 DIAGNOSIS — G4719 Other hypersomnia: Secondary | ICD-10-CM

## 2014-08-12 DIAGNOSIS — I671 Cerebral aneurysm, nonruptured: Secondary | ICD-10-CM | POA: Diagnosis not present

## 2014-08-12 DIAGNOSIS — R112 Nausea with vomiting, unspecified: Secondary | ICD-10-CM

## 2014-08-12 DIAGNOSIS — G932 Benign intracranial hypertension: Secondary | ICD-10-CM | POA: Diagnosis not present

## 2014-08-12 DIAGNOSIS — R110 Nausea: Secondary | ICD-10-CM | POA: Diagnosis not present

## 2014-08-12 DIAGNOSIS — R0683 Snoring: Secondary | ICD-10-CM | POA: Diagnosis not present

## 2014-08-12 DIAGNOSIS — B9789 Other viral agents as the cause of diseases classified elsewhere: Secondary | ICD-10-CM

## 2014-08-12 DIAGNOSIS — I10 Essential (primary) hypertension: Secondary | ICD-10-CM

## 2014-08-12 DIAGNOSIS — H9312 Tinnitus, left ear: Secondary | ICD-10-CM

## 2014-08-12 DIAGNOSIS — J988 Other specified respiratory disorders: Principal | ICD-10-CM

## 2014-08-12 HISTORY — DX: Unspecified visual disturbance: H53.9

## 2014-08-12 MED ORDER — TOPIRAMATE 50 MG PO TABS: 50.0000 mg | ORAL_TABLET | Freq: Two times a day (BID) | ORAL | Status: DC

## 2014-08-12 MED ORDER — GUAIFENESIN-CODEINE 100-10 MG/5ML PO SYRP: 5.0000 mL | ORAL_SOLUTION | Freq: Three times a day (TID) | ORAL | Status: DC | PRN

## 2014-08-12 MED ORDER — HYDROCHLOROTHIAZIDE 12.5 MG PO TABS: 12.5000 mg | ORAL_TABLET | Freq: Every day | ORAL | Status: DC

## 2014-08-12 NOTE — Assessment & Plan Note (Signed)
BP Readings from Last 3 Encounters:  08/12/14 171/84  08/12/14 171/96  08/02/14 180/102    Lab Results  Component Value Date   NA 138 06/23/2014   K 4.0 06/23/2014   CREATININE 0.84 06/23/2014    Assessment: Blood pressure control: moderately elevated Progress toward BP goal:  unchanged  Plan: Medications:  She is on lasix 20mg  daily, lisinopril 40mg  daily, metoprolol 100mg  BID. -It appears that she was previously on HCTZ and this was discontinued on a hospital admission 09/2013 as it was thought to be contributing to hypokalemia. Restart HCTZ at 12.5mg  daily as this lower dose is less likely to cause hypokalemia. Last potassium 4 on 06/23/2014.

## 2014-08-12 NOTE — Patient Instructions (Signed)
Overall you are doing fairly well but I do want to suggest a few things today:   Remember to drink plenty of fluid, eat healthy meals and do not skip any meals. Try to eat protein with a every meal and eat a healthy snack such as fruit or nuts in between meals. Try to keep a regular sleep-wake schedule and try to exercise daily, particularly in the form of walking, 20-30 minutes a day, if you can.   As far as your medications are concerned, I would like to : Topamax 50mg  twice daily. Do not get pregnant, use birth control.   As far as diagnostic testing: MRI/MRA head  I would like to see you back in 3 months, sooner if we need to. Please call us with any interim questions, concerns, problems, updates or refill requests.   Please also call us for any test results so we can go over those with you on the phone.  My clinical assistant and will answer any of your questions and relay your messages to me and also relay most of my messages to you.   Our phone number is [phone]. We also have an after hours call service for urgent matters and there is a physician on-call for urgent questions. For any emergencies you know to call 911 or go to the nearest emergency room

## 2014-08-12 NOTE — Progress Notes (Signed)
Patient ID: [name], female   DOB: [birth year], 49 y.o.   MRN: [id number] Patient reports she thinks her grandson threw her glucometer away. Patient was provided with a sample Accu chek Nano meter today. . She says that she also got her previous meter from our office in 2015. She is to call if she needs supplies or has other problems

## 2014-08-12 NOTE — Assessment & Plan Note (Signed)
Assessment: Cough likely 2/2 acute viral upper respiratory infection. She has not had any fevers, chills. Lung exam clear.  Plan: -Supportive treatment -guaiFENesin-codeine (ROBITUSSIN AC) 100-10 MG/5ML syrup TID prn cough -Instructed patient to return to clinic if fevers or worsening shortness of breath

## 2014-08-12 NOTE — Patient Instructions (Signed)
General Instructions:   Please bring your medicines with you each time you come to clinic.  Medicines may include prescription medications, over-the-counter medications, herbal remedies, eye drops, vitamins, or other pills.   Progress Toward Treatment Goals:  Treatment Goal 08/12/2014  Hemoglobin A1C -  Blood pressure unchanged  Stop smoking -    Self Care Goals & Plans:  Self Care Goal 08/12/2014  Manage my medications take my medicines as prescribed; refill my medications on time; bring my medications to every visit  Monitor my health -  Eat healthy foods drink diet soda or water instead of juice or soda; eat fruit for snacks and desserts; eat more vegetables; eat foods that are low in salt  Be physically active -  Stop smoking -  Meeting treatment goals -    Home Blood Glucose Monitoring 02/22/2014  Check my blood sugar 3 times a day  When to check my blood sugar before meals

## 2014-08-12 NOTE — Progress Notes (Signed)
[facility]    Provider:  Dr Jaynee Eagles Referring Provider: Jodi Marble, MD Primary Care Physician:  Duwaine Maxin, DO  CC:  headache  HPI:  [name] is a 49 y.o. female here as a referral from Dr. Erik Obey for headache. Past medical history hypertension, diabetes, migraine, depression, anxiety, high cholesterol.  Headaches started 3-4 months ago.Worsening. Used to have them as a child but they went away. Pain and pressure, behind the nose. Thought it was a sinus infection an CT was negative. Worsening. Every day. Tyelnol/advil every day, not helping. Worse at night. With vomiting. Wakes up with them every day. Pressure. Vision is blurry at times. Severe pressure all around the head. She hears her heart beat in her ears. No recent excessive weight gain. Smokes and is coughing today in the office. Wearing a patch, quitting smoking. Worsening memory. She falls asleep at a drop. She has been told she has OSA in the past but never did the test. If sitting on the couch she will fall asleep.   Reviewed notes, labs and imaging from outside physicians, which showed: Notes from [facility] show that patient was evaluated in March 2016 about a sinus problem. Patient was given several rounds of antibiotics, the maxillofacial CT did not show sinusitis. She uses Nasonex. She also reported chronic headaches, daily. Putting daily headaches for the past 4 years, localized over the forehead and in between the eyes. Throbbing. Worse outdoors. Worse in the spring and fall. Maxillofacial CT scan on 07/29/2014 showed no evidence of sinusitis, very minimal mucosal thickening present in the maxillary sinuses and the right partition of the sphenoid sinus.   CT head 2015 showed No acute intracranial abnormalities including mass lesion or mass effect, hydrocephalus, extra-axial fluid collection, midline shift, hemorrhage, or acute infarction, large ischemic events (personally reviewed  images)     Review of Systems: Patient complains of symptoms per HPI as well as the following symptoms: Weight loss, fatigue, blurred vision, anemia, chest pain, murmur, shortness of breath, cough, snoring, feeling hot, diarrhea, aching muscles, allergies, runny nose, Emery loss, headache, numbness, weakness, dizziness, depression, anxiety, not enough sleep, decreased energy, change in appetite, insomnia. Pertinent negatives per HPI. All others negative.   History   Social History  . Marital Status: Single    Spouse Name: N/A  . Number of Children: 2  . Years of Education: 12   Occupational History  . Disability   . UNEMPLOYED    Social History Main Topics  . Smoking status: Current Every Day Smoker -- 1.00 packs/day for 30 years    Types: Cigarettes    Start date: 05/18/2012  . Smokeless tobacco: Never Used     Comment: cutting back,  . Alcohol Use: No     Comment: hx of ETOH abuse  . Drug Use: No  . Sexual Activity: Not on file   Other Topics Concern  . 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 Colon cancer Other   . Hypertension Mother   . Stroke Father     deceased at age 89  . Heart disease Sister   . Anesthesia problems Neg Hx   .  Hypotension Neg Hx   . Malignant hyperthermia Neg Hx   . Pseudochol deficiency Neg Hx   . Diabetes    . High Cholesterol    . Arthritis      Past Medical History  Diagnosis Date  . Arteriosclerotic cardiovascular disease (ASCVD)     Minimal at cath in [facility].stress nuclear study in 8/08 with nl EF; neg stress echo in 2010  . Diabetes mellitus, type 2 2000    Onset in 2000; no insulin  .  Hyperlipidemia   . Hypertension `    during treatment with Geodon  . Gastroesophageal reflux disease     Schatzki's ring  . Anemia, iron deficiency   . Alcohol abuse   . Depression   . Community acquired pneumonia 01/03/10, 05/2010, 04/2012    2011; with pleural effusion-[facility] acute resp failure; intubated in Jan 2014 (HMPV pneumonia)  . Obesity   . Schizoaffective disorder     requiring multiple psychiatric admissions  . Dysphagia   . Diastolic dysfunction     grade 2 per echo 2011  . Pulmonary hypertension 05/02/2012    Patient needs repeat echo in 06/2012   . History of alcohol abuse 07/22/2007    Qualifier: Diagnosis of  By: Lenn Cal      Past Surgical History  Procedure Laterality Date  . Dilation and curettage, diagnostic / therapeutic  1992  . Esophagogastroduodenoscopy  09/16/08    Dr. Trevor Iha hiatal hernia/excoriations involving the cardia and mucosa consistent with trauma, antral erosions  of linear petechiae ? gastritis versus early gastric antral vascular  ectasia.Marland Kitchen biopsy showed reactive gastropathy. No H. pylori.  . Esophagogastroduodenoscopy  09/2007    Dr. Evalee Mutton ring, dilated to 75 French Maloney dilator, small hiatal hernia, antral erosions, biopsies reactive gastropathy.  Azzie Almas dilation  07/17/2011    [facility] sedation-->distal esophageal stricture s/p dilation, chronic gastritis, multiple ulcers in stomach. no h.pylori  . Colonoscopy  01/2006    internal hemorrhoids  . Colonoscopy  01/10/2012    Dr. Rourk:Single anal canal hemorrhoidal tag likely source of  trivial hematochezia; right-sided colonic diverticulosis  . Esophagogastroduodenoscopy (egd) with propofol N/A 12/17/2013    [facility]:LFYBOFB antral erosions and petechiae. Small hiatal hernia. No endoscopic explanation for patient's symptoms    Current Outpatient Prescriptions  Medication Sig Dispense Refill  . acetaminophen (TYLENOL) 500 MG tablet Take 1,000 mg by mouth every 6 (six)  hours as needed for mild pain.    Marland Kitchen albuterol (PROVENTIL HFA;VENTOLIN HFA) 108 (90 BASE) MCG/ACT inhaler Inhale 2 puffs into the lungs 3 (three) times daily as needed for wheezing or shortness of breath. 1 Inhaler 2  . albuterol (PROVENTIL) (2.5 MG/3ML) 0.083% nebulizer solution Take 3 mLs (2.5 mg total) by nebulization every 6 (six) hours as needed for wheezing or shortness of breath. 75 mL 12  . esomeprazole (NEXIUM) 40 MG capsule Take 1 capsule (40 mg total) by mouth 2 (two) times daily before a meal. 60 capsule 2  . ferrous sulfate 325 (65 FE) MG tablet Take 1 tablet (325 mg total) by mouth 3 (three) times daily with meals. 90 tablet 3  . furosemide (LASIX) 20 MG tablet Take 1 tablet (20 mg total) by mouth daily. 30 tablet 6  . HYDROcodone-acetaminophen (NORCO) 5-325 MG per tablet Take 1 tablet by mouth every 6 (six) hours as needed for moderate pain. 15 tablet 0  . ibuprofen (ADVIL,MOTRIN) 200 MG tablet Take 400 mg by mouth every 6 (six) hours as  needed for mild pain.    . Insulin Glargine (LANTUS SOLOSTAR) 100 UNIT/ML Solostar Pen Inject 22 Units into the skin at bedtime. (Patient taking differently: Inject 24 Units into the skin at bedtime. ) 15 mL 12  . lisinopril (PRINIVIL,ZESTRIL) 40 MG tablet Take 1 tablet (40 mg total) by mouth daily. 30 tablet 4  . loperamide (IMODIUM A-D) 2 MG tablet Take 1 tablet (2 mg total) by mouth 4 (four) times daily as needed for diarrhea or loose stools. 30 tablet 0  . loratadine (CLARITIN) 10 MG tablet Take 10 mg by mouth daily.    Marland Kitchen lovastatin (MEVACOR) 20 MG tablet Take 1 tablet (20 mg total) by mouth at bedtime. 90 tablet 3  . meclizine (ANTIVERT) 50 MG tablet Take 1 tablet (50 mg total) by mouth 3 (three) times daily as needed for dizziness. 30 tablet 0  . metFORMIN (GLUCOPHAGE) 1000 MG tablet TAKE (1) TABLET BY MOUTH TWICE DAILY WITH MEALS. 60 tablet 6  . metoprolol (LOPRESSOR) 100 MG tablet Take 1 tablet (100 mg total) by mouth 2 (two) times daily. 60  tablet 6  . mometasone (NASONEX) 50 MCG/ACT nasal spray Place 2 sprays into the nose 2 (two) times daily. 17 g 12  . ondansetron (ZOFRAN ODT) 4 MG disintegrating tablet 4mg  ODT q4 hours prn nausea/vomit 12 tablet 0  . potassium chloride SA (K-DUR,KLOR-CON) 20 MEQ tablet Take 1 tablet (20 mEq total) by mouth 2 (two) times daily. 60 tablet 5  . saccharomyces boulardii (FLORASTOR) 250 MG capsule Take 1 capsule (250 mg total) by mouth 2 (two) times daily. 60 capsule 1  . sucralfate (CARAFATE) 1 G tablet Take 1 g by mouth 4 (four) times daily -  with meals and at bedtime.    . ziprasidone (GEODON) 80 MG capsule Take 1 capsule (80 mg total) by mouth 2 (two) times daily with a meal. 60 capsule 0  . doxycycline (VIBRAMYCIN) 100 MG capsule Take 1 capsule (100 mg total) by mouth 2 (two) times daily. (Patient not taking: Reported on 07/26/2014) 20 capsule 0  . hydrOXYzine (ATARAX/VISTARIL) 10 MG tablet Take 10 mg by mouth daily.    . meloxicam (MOBIC) 15 MG tablet Take 1 tablet (15 mg total) by mouth daily. For 5 days (Patient not taking: Reported on 08/12/2014) 30 tablet 0  . NOVOFINE 32G X 6 MM MISC     . ofloxacin (OCUFLOX) 0.3 % ophthalmic solution Place 1 drop into the right eye 4 (four) times daily. (Patient not taking: Reported on 07/26/2014) 5 mL 0  . promethazine (PHENERGAN) 25 MG suppository 25 mg.     No current facility-administered medications for this visit.   Facility-Administered Medications Ordered in Other Visits  Medication Dose Route Frequency Provider Last Rate Last Dose  . 0.9 %  sodium chloride infusion   Intravenous Once Merryl Hacker, MD      . ondansetron [facility]) injection 4 mg  4 mg Intramuscular Once Merryl Hacker, MD      . pantoprazole (PROTONIX) injection 40 mg  40 mg Intravenous Once Merryl Hacker, MD        Allergies as of 08/12/2014 - Review Complete 08/12/2014  Allergen Reaction Noted  . Cephalexin  10/14/2013  . Metronidazole Shortness Of Breath and  Swelling   . Orange Itching 03/27/2011  . Shrimp [shellfish allergy] Shortness Of Breath and Itching 03/27/2011  . Penicillins Hives and Swelling   . Sulfonamide derivatives Hives   . Glipizide Other (See Comments)  01/15/2011  . Sulfamethoxazole-trimethoprim Rash 02/27/2010    Vitals: BP 171/96 mmHg  Pulse 76  Temp(Src) 97.7 F (36.5 C)  Ht 5\' 4"  (1.626 m)  Wt 211 lb (95.709 kg)  BMI 36.20 kg/m2  LMP 07/29/2014 Last Weight:  Wt Readings from Last 1 Encounters:  08/12/14 211 lb (95.709 kg)   Last Height:   Ht Readings from Last 1 Encounters:  08/12/14 5\' 4"  (1.626 m)         Assessment/Plan:  49 year old female with 4 months of worsening headaches, pressure in the head, excessive daytime somnolence and sleepiness, daily morning headaches, snoring. Suspected obstructive sleep apnea in the past but never had the test done. Also with morbid obesity, suspect hypoventilation obesity syndrome.  Sleep Study: Please evaluate for obstructive sleep apnea and obesity hypoventilation syndrome in this patient with snoring and excessive daytime sleepiness, morning headaches.  Symptoms also concerning for idiopathic intracranial hypertension. Funduscopic exam normal the patient does describe pressure around the head, blurry vision, hearing her pulse in her ear. We'll check MRI of the brain and MRA of the head. We'll also check CMP.    Sarina Ill, MD  [facility] [address] [place], [place] [address]  Phone [phone] Fax [phone]

## 2014-08-12 NOTE — Progress Notes (Signed)
Subjective:   Patient ID: [name], female    DOB: 06-26-1965, 49 y.o.   MRN: [id number]  HPI [name] is a 49 year old woman with history of CAD, DM2, HTN, GERD, anemia presenting with cough.  She reports her cough started 3 days ago. It is non productive. Her grandson was sick with a similar cough 1 week ago. She has some shortness of breath and nasal congestion. Denies fevers, chills, rhinorrhea, sneezing, facial pain, CP, abdominal pain, myalgias.  She was last seen in clinic 07/26/2014 for evaluation of sinusitis. She was encouraged to complete a course of doxycycline that was prescribed by urgent care. She did not complete the doxycycline. CT maxillofacial with no evidence of sinusitis and very minimal mucosal thickening in the maxillary sinuses and right partition of the sphenoid sinus.  Review of Systems  Constitutional: no fevers/chills Eyes: no vision changes Ears, nose, mouth, throat, and face: +cough Respiratory: +shortness of breath Cardiovascular: no chest pain Gastrointestinal: +chronic vomiting, no abdominal pain, no constipation, +chronic diarrhea Genitourinary: no dysuria, no hematuria Integument: no rash Hematologic/lymphatic: no bleeding/bruising, no edema Musculoskeletal: no arthralgias, no myalgias Neurological: no paresthesias, no weakness  Past Medical History  Diagnosis Date  . Arteriosclerotic cardiovascular disease (ASCVD)     Minimal at cath in [facility].stress nuclear study in 8/08 with nl EF; neg stress echo in 2010  . Diabetes mellitus, type 2 2000    Onset in 2000; no insulin  . Hyperlipidemia   . Hypertension `    during treatment with Geodon  . Gastroesophageal reflux disease     Schatzki's ring  . Anemia, iron deficiency   . Alcohol abuse   . Depression   . Community acquired pneumonia 01/03/10, 05/2010, 04/2012    2011; with pleural effusion-[facility] acute resp failure; intubated in Jan 2014 (HMPV pneumonia)    . Obesity   . Schizoaffective disorder     requiring multiple psychiatric admissions  . Dysphagia   . Diastolic dysfunction     grade 2 per echo 2011  . Pulmonary hypertension 05/02/2012    Patient needs repeat echo in 06/2012   . History of alcohol abuse 07/22/2007    Qualifier: Diagnosis of  By: Lenn Cal     Current Outpatient Prescriptions on File Prior to Visit  Medication Sig Dispense Refill  . acetaminophen (TYLENOL) 500 MG tablet Take 1,000 mg by mouth every 6 (six) hours as needed for mild pain.    Marland Kitchen albuterol (PROVENTIL HFA;VENTOLIN HFA) 108 (90 BASE) MCG/ACT inhaler Inhale 2 puffs into the lungs 3 (three) times daily as needed for wheezing or shortness of breath. 1 Inhaler 2  . albuterol (PROVENTIL) (2.5 MG/3ML) 0.083% nebulizer solution Take 3 mLs (2.5 mg total) by nebulization every 6 (six) hours as needed for wheezing or shortness of breath. 75 mL 12  . doxycycline (VIBRAMYCIN) 100 MG capsule Take 1 capsule (100 mg total) by mouth 2 (two) times daily. (Patient not taking: Reported on 07/26/2014) 20 capsule 0  . esomeprazole (NEXIUM) 40 MG capsule Take 1 capsule (40 mg total) by mouth 2 (two) times daily before a meal. 60 capsule 2  . ferrous sulfate 325 (65 FE) MG tablet Take 1 tablet (325 mg total) by mouth 3 (three) times daily with meals. 90 tablet 3  . furosemide (LASIX) 20 MG tablet Take 1 tablet (20 mg total) by mouth daily. 30 tablet 6  . HYDROcodone-acetaminophen (NORCO) 5-325 MG per tablet Take 1 tablet by  mouth every 6 (six) hours as needed for moderate pain. 15 tablet 0  . hydrOXYzine (ATARAX/VISTARIL) 10 MG tablet Take 10 mg by mouth daily.    Marland Kitchen ibuprofen (ADVIL,MOTRIN) 200 MG tablet Take 400 mg by mouth every 6 (six) hours as needed for mild pain.    . Insulin Glargine (LANTUS SOLOSTAR) 100 UNIT/ML Solostar Pen Inject 22 Units into the skin at bedtime. (Patient taking differently: Inject 24 Units into the skin at bedtime. ) 15 mL 12  . lisinopril (PRINIVIL,ZESTRIL)  40 MG tablet Take 1 tablet (40 mg total) by mouth daily. 30 tablet 4  . loperamide (IMODIUM A-D) 2 MG tablet Take 1 tablet (2 mg total) by mouth 4 (four) times daily as needed for diarrhea or loose stools. 30 tablet 0  . loratadine (CLARITIN) 10 MG tablet Take 10 mg by mouth daily.    Marland Kitchen lovastatin (MEVACOR) 20 MG tablet Take 1 tablet (20 mg total) by mouth at bedtime. 90 tablet 3  . meclizine (ANTIVERT) 50 MG tablet Take 1 tablet (50 mg total) by mouth 3 (three) times daily as needed for dizziness. 30 tablet 0  . meloxicam (MOBIC) 15 MG tablet Take 1 tablet (15 mg total) by mouth daily. For 5 days (Patient not taking: Reported on 08/12/2014) 30 tablet 0  . metFORMIN (GLUCOPHAGE) 1000 MG tablet TAKE (1) TABLET BY MOUTH TWICE DAILY WITH MEALS. 60 tablet 6  . metoprolol (LOPRESSOR) 100 MG tablet Take 1 tablet (100 mg total) by mouth 2 (two) times daily. 60 tablet 6  . mometasone (NASONEX) 50 MCG/ACT nasal spray Place 2 sprays into the nose 2 (two) times daily. 17 g 12  . NOVOFINE 32G X 6 MM MISC     . ofloxacin (OCUFLOX) 0.3 % ophthalmic solution Place 1 drop into the right eye 4 (four) times daily. (Patient not taking: Reported on 07/26/2014) 5 mL 0  . ondansetron (ZOFRAN ODT) 4 MG disintegrating tablet 4mg  ODT q4 hours prn nausea/vomit 12 tablet 0  . potassium chloride SA (K-DUR,KLOR-CON) 20 MEQ tablet Take 1 tablet (20 mEq total) by mouth 2 (two) times daily. 60 tablet 5  . promethazine (PHENERGAN) 25 MG suppository 25 mg.    . saccharomyces boulardii (FLORASTOR) 250 MG capsule Take 1 capsule (250 mg total) by mouth 2 (two) times daily. 60 capsule 1  . sucralfate (CARAFATE) 1 G tablet Take 1 g by mouth 4 (four) times daily -  with meals and at bedtime.    . topiramate (TOPAMAX) 50 MG tablet Take 1 tablet (50 mg total) by mouth 2 (two) times daily. 60 tablet 6  . ziprasidone (GEODON) 80 MG capsule Take 1 capsule (80 mg total) by mouth 2 (two) times daily with a meal. 60 capsule 0   Current  Facility-Administered Medications on File Prior to Visit  Medication Dose Route Frequency Provider Last Rate Last Dose  . 0.9 %  sodium chloride infusion   Intravenous Once Merryl Hacker, MD      . ondansetron [facility]) injection 4 mg  4 mg Intramuscular Once Merryl Hacker, MD      . pantoprazole (PROTONIX) injection 40 mg  40 mg Intravenous Once Merryl Hacker, MD       Today's Vitals   08/12/14 1340 08/12/14 1341  BP: 171/84   Pulse: 87   Temp: 98 F (36.7 C)   Weight: 213 lb 9.6 oz (96.888 kg)   SpO2: 100%   PainSc:  8    Objective:  Physical Exam  Constitutional: She is oriented to person, place, and time. She appears well-developed. No distress.  HENT:  Head: Normocephalic and atraumatic.  Right Ear: External ear normal.  Left Ear: External ear normal.  Mouth/Throat: Oropharynx is clear and moist. No oropharyngeal exudate.  Eyes: EOM are normal. Pupils are equal, round, and reactive to light.  Neck: Neck supple.  Cardiovascular: Normal rate, regular rhythm and normal heart sounds.  Exam reveals no gallop and no friction rub.   No murmur heard. Pulmonary/Chest: Effort normal and breath sounds normal. No respiratory distress. She has no wheezes. She has no rales.  Abdominal: Soft. Bowel sounds are normal. She exhibits no distension and no mass. There is no tenderness. There is no rebound and no guarding.  Musculoskeletal: Normal range of motion. She exhibits no edema or tenderness.  Lymphadenopathy:    She has no cervical adenopathy.  Neurological: She is alert and oriented to person, place, and time. No cranial nerve deficit.  Skin: Skin is warm. She is not diaphoretic.  Psychiatric: She has a normal mood and affect. Her behavior is normal.    Assessment & Plan:  Please refer to problem based charting.

## 2014-08-13 ENCOUNTER — Telehealth: Payer: Self-pay | Admitting: *Deleted

## 2014-08-13 LAB — COMPREHENSIVE METABOLIC PANEL
A/G RATIO: 1.8 (ref 1.1–2.5)
ALT: 9 IU/L (ref 0–32)
AST: 11 IU/L (ref 0–40)
Albumin: 4 g/dL (ref 3.5–5.5)
Alkaline Phosphatase: 75 IU/L (ref 39–117)
BUN/Creatinine Ratio: 10 (ref 9–23)
BUN: 9 mg/dL (ref 6–24)
Bilirubin Total: <0.2 mg/dL (ref 0.0–1.2)
CALCIUM: 9.1 mg/dL (ref 8.7–10.2)
CO2: 22 mmol/L (ref 18–29)
Chloride: 100 mmol/L (ref 97–108)
Creatinine, Ser: 0.87 mg/dL (ref 0.57–1.00)
GFR calc Af Amer: 92 mL/min/{1.73_m2} (ref >59)
GFR, EST NON AFRICAN AMERICAN: 80 mL/min/{1.73_m2} (ref >59)
GLOBULIN, TOTAL: 2.2 g/dL (ref 1.5–4.5)
Glucose: 143 mg/dL — ABNORMAL HIGH (ref 65–99)
Potassium: 4 mmol/L (ref 3.5–5.2)
Sodium: 139 mmol/L (ref 134–144)
Total Protein: 6.2 g/dL (ref 6.0–8.5)

## 2014-08-13 NOTE — Telephone Encounter (Signed)
Talked with patient and told her that her labs were unremarkable. Pt verbalized understanding. Pt stated she is not feeling well at all and went to the doctor and they told her she has bronchitis. She was given Robitussin and hydrochlorothiazide yesterday. Pt stated she started to vomit and have diarrhea yesterday, and not sure if its one of the medications she is taking.  I told her to call back to [facility] Hoff office at [facility], where she was seen yesterday for her bronchitis and to speak with them about her symptoms and medications they gave her yesterday. I spoke with Dr. Jaynee Eagles who wants the pt to stop taking the topamax medication until she starts to feel better.  Pt verbalized understanding.

## 2014-08-14 ENCOUNTER — Telehealth: Payer: Self-pay | Admitting: Internal Medicine

## 2014-08-14 MED ORDER — BENZONATATE 100 MG PO CAPS: 100.0000 mg | ORAL_CAPSULE | Freq: Four times a day (QID) | ORAL | Status: DC | PRN

## 2014-08-14 NOTE — Telephone Encounter (Signed)
  INTERNAL MEDICINE RESIDENCY PROGRAM After-Hours Telephone Call    Reason for call:   I received a call from [name] at 6:09 PM, 08/14/2014 indicating that she is coughing so much she is unable to sleep and the Robitussin [facility] she was prescribed in the clinic is making her sick.    Pertinent Data:   Seen by [name] Patient on 08/12/14 for viral URI, givfen Rx for Robitussin AC. Patient claims this medication is making her sick and her cough is still significant enough that it is keeping her from sleeping.     Assessment / Plan / Recommendations:   Gave Rx for Tessalon perrls 100 mg q6h prn for cough  As always, pt is advised that if symptoms worsen or new symptoms arise, they should go to an urgent care facility or to to ER for further evaluation.    Rachel Sox, MD   08/14/2014, 6:09 PM

## 2014-08-16 ENCOUNTER — Encounter: Payer: Self-pay | Admitting: Internal Medicine

## 2014-08-16 NOTE — Progress Notes (Signed)
Case discussed with Dr. Krall at time of visit.  We reviewed the resident's history and exam and pertinent patient test results.  I agree with the assessment, diagnosis, and plan of care documented in the resident's note. 

## 2014-08-19 ENCOUNTER — Other Ambulatory Visit: Payer: Self-pay | Admitting: Internal Medicine

## 2014-08-19 ENCOUNTER — Ambulatory Visit (HOSPITAL_COMMUNITY)
Admission: RE | Admit: 2014-08-19 | Discharge: 2014-08-19 | Disposition: A | Discharge status: Home / Self Care | Payer: Medicare Other | Source: Ambulatory Visit | Attending: Internal Medicine | Admitting: Internal Medicine

## 2014-08-19 DIAGNOSIS — E119 Type 2 diabetes mellitus without complications: Secondary | ICD-10-CM | POA: Diagnosis not present

## 2014-08-19 DIAGNOSIS — R140 Abdominal distension (gaseous): Secondary | ICD-10-CM | POA: Diagnosis not present

## 2014-08-19 DIAGNOSIS — R112 Nausea with vomiting, unspecified: Secondary | ICD-10-CM | POA: Diagnosis not present

## 2014-08-19 MED ORDER — TECHNETIUM TC 99M SULFUR COLLOID: 2.0000 | Freq: Once | INTRAVENOUS | Status: AC | PRN

## 2014-08-20 ENCOUNTER — Telehealth: Payer: Self-pay | Admitting: Internal Medicine

## 2014-08-20 NOTE — Telephone Encounter (Signed)
I called [name] and informed her of normal GES.  Patient to follow-up with me in clinic next month.

## 2014-08-23 ENCOUNTER — Other Ambulatory Visit: Payer: Self-pay | Admitting: Internal Medicine

## 2014-08-25 ENCOUNTER — Encounter: Payer: Self-pay | Admitting: Internal Medicine

## 2014-08-25 ENCOUNTER — Ambulatory Visit (INDEPENDENT_AMBULATORY_CARE_PROVIDER_SITE_OTHER): Payer: Medicare Other | Admitting: Internal Medicine

## 2014-08-25 ENCOUNTER — Ambulatory Visit: Payer: Medicare Other | Admitting: Podiatry

## 2014-08-25 VITALS — BP 121/78 | HR 78 | Temp 98.2°F | Ht 64.0 in | Wt 207.4 lb

## 2014-08-25 DIAGNOSIS — I10 Essential (primary) hypertension: Secondary | ICD-10-CM | POA: Diagnosis not present

## 2014-08-25 DIAGNOSIS — M25562 Pain in left knee: Secondary | ICD-10-CM | POA: Diagnosis present

## 2014-08-25 DIAGNOSIS — E119 Type 2 diabetes mellitus without complications: Secondary | ICD-10-CM | POA: Diagnosis not present

## 2014-08-25 HISTORY — DX: Pain in left knee: M25.562

## 2014-08-25 LAB — POCT GLYCOSYLATED HEMOGLOBIN (HGB A1C): Hemoglobin A1C: 7.1

## 2014-08-25 NOTE — Progress Notes (Signed)
Subjective:    Patient ID: [name], female    DOB: 21-Nov-1965, 49 y.o.   MRN: [id number]  HPI Comments: [name] is a 49 year old with PMH as below here for referral to L knee pain x 3 weeks after fall. Please see problem based charting A&P.    Past Medical History  Diagnosis Date  . Arteriosclerotic cardiovascular disease (ASCVD)     Minimal at cath in [facility].stress nuclear study in 8/08 with nl EF; neg stress echo in 2010  . Diabetes mellitus, type 2 2000    Onset in 2000; no insulin  . Hyperlipidemia   . Hypertension `    during treatment with Geodon  . Gastroesophageal reflux disease     Schatzki's ring  . Anemia, iron deficiency   . Alcohol abuse   . Depression   . Community acquired pneumonia 01/03/10, 05/2010, 04/2012    2011; with pleural effusion-[facility] acute resp failure; intubated in Jan 2014 (HMPV pneumonia)  . Obesity   . Schizoaffective disorder     requiring multiple psychiatric admissions  . Dysphagia   . Diastolic dysfunction     grade 2 per echo 2011  . Pulmonary hypertension 05/02/2012    Patient needs repeat echo in 06/2012   . History of alcohol abuse 07/22/2007    Qualifier: Diagnosis of  By: Lenn Cal     Current Outpatient Prescriptions on File Prior to Visit  Medication Sig Dispense Refill  . acetaminophen (TYLENOL) 500 MG tablet Take 1,000 mg by mouth every 6 (six) hours as needed for mild pain.    Marland Kitchen albuterol (PROVENTIL HFA;VENTOLIN HFA) 108 (90 BASE) MCG/ACT inhaler Inhale 2 puffs into the lungs 3 (three) times daily as needed for wheezing or shortness of breath. 1 Inhaler 2  . albuterol (PROVENTIL) (2.5 MG/3ML) 0.083% nebulizer solution Take 3 mLs (2.5 mg total) by nebulization every 6 (six) hours as needed for wheezing or shortness of breath. 75 mL 12  . benzonatate (TESSALON PERLES) 100 MG capsule Take 1 capsule (100 mg total) by mouth every 6 (six) hours as needed for cough. 30 capsule 1  . doxycycline  (VIBRAMYCIN) 100 MG capsule Take 1 capsule (100 mg total) by mouth 2 (two) times daily. (Patient not taking: Reported on 07/26/2014) 20 capsule 0  . ferrous sulfate 325 (65 FE) MG tablet Take 1 tablet (325 mg total) by mouth 3 (three) times daily with meals. 90 tablet 3  . furosemide (LASIX) 20 MG tablet Take 1 tablet (20 mg total) by mouth daily. 30 tablet 6  . guaiFENesin-codeine (ROBITUSSIN AC) 100-10 MG/5ML syrup Take 5 mLs by mouth 3 (three) times daily as needed for cough. 120 mL 0  . hydrochlorothiazide (HYDRODIURIL) 12.5 MG tablet Take 1 tablet (12.5 mg total) by mouth daily. 30 tablet 0  . HYDROcodone-acetaminophen (NORCO) 5-325 MG per tablet Take 1 tablet by mouth every 6 (six) hours as needed for moderate pain. 15 tablet 0  . hydrOXYzine (ATARAX/VISTARIL) 10 MG tablet Take 10 mg by mouth daily.    Marland Kitchen ibuprofen (ADVIL,MOTRIN) 200 MG tablet Take 400 mg by mouth every 6 (six) hours as needed for mild pain.    . Insulin Glargine (LANTUS SOLOSTAR) 100 UNIT/ML Solostar Pen Inject 22 Units into the skin at bedtime. (Patient taking differently: Inject 24 Units into the skin at bedtime. ) 15 mL 12  . lisinopril (PRINIVIL,ZESTRIL) 40 MG tablet Take 1 tablet (40 mg total) by mouth daily. 30 tablet  4  . loperamide (IMODIUM A-D) 2 MG tablet Take 1 tablet (2 mg total) by mouth 4 (four) times daily as needed for diarrhea or loose stools. 30 tablet 0  . loratadine (CLARITIN) 10 MG tablet Take 10 mg by mouth daily.    Marland Kitchen lovastatin (MEVACOR) 20 MG tablet Take 1 tablet (20 mg total) by mouth at bedtime. 90 tablet 3  . meclizine (ANTIVERT) 50 MG tablet Take 1 tablet (50 mg total) by mouth 3 (three) times daily as needed for dizziness. 30 tablet 0  . meloxicam (MOBIC) 15 MG tablet Take 1 tablet (15 mg total) by mouth daily. For 5 days (Patient not taking: Reported on 08/12/2014) 30 tablet 0  . metFORMIN (GLUCOPHAGE) 1000 MG tablet TAKE (1) TABLET BY MOUTH TWICE DAILY WITH MEALS. 60 tablet 6  . metoprolol  (LOPRESSOR) 100 MG tablet Take 1 tablet (100 mg total) by mouth 2 (two) times daily. 60 tablet 6  . mometasone (NASONEX) 50 MCG/ACT nasal spray Place 2 sprays into the nose 2 (two) times daily. 17 g 12  . NEXIUM 40 MG capsule TAKE ONE CAPSULE BY MOUTH TWICE DAILY BEFORE A MEAL. 60 capsule 0  . NOVOFINE 32G X 6 MM MISC     . ofloxacin (OCUFLOX) 0.3 % ophthalmic solution Place 1 drop into the right eye 4 (four) times daily. (Patient not taking: Reported on 07/26/2014) 5 mL 0  . ondansetron (ZOFRAN ODT) 4 MG disintegrating tablet 4mg  ODT q4 hours prn nausea/vomit 12 tablet 0  . potassium chloride SA (K-DUR,KLOR-CON) 20 MEQ tablet Take 1 tablet (20 mEq total) by mouth 2 (two) times daily. 60 tablet 5  . promethazine (PHENERGAN) 25 MG suppository 25 mg.    . saccharomyces boulardii (FLORASTOR) 250 MG capsule Take 1 capsule (250 mg total) by mouth 2 (two) times daily. 60 capsule 1  . sucralfate (CARAFATE) 1 G tablet Take 1 g by mouth 4 (four) times daily -  with meals and at bedtime.    . topiramate (TOPAMAX) 50 MG tablet Take 1 tablet (50 mg total) by mouth 2 (two) times daily. 60 tablet 6  . ziprasidone (GEODON) 80 MG capsule Take 1 capsule (80 mg total) by mouth 2 (two) times daily with a meal. 60 capsule 0   Current Facility-Administered Medications on File Prior to Visit  Medication Dose Route Frequency Provider Last Rate Last Dose  . 0.9 %  sodium chloride infusion   Intravenous Once Merryl Hacker, MD      . ondansetron [facility]) injection 4 mg  4 mg Intramuscular Once Merryl Hacker, MD      . pantoprazole (PROTONIX) injection 40 mg  40 mg Intravenous Once Merryl Hacker, MD        Review of Systems  Constitutional: Negative for unexpected weight change.  Respiratory: Positive for cough. Negative for shortness of breath.   Cardiovascular: Negative for chest pain, palpitations and leg swelling.  Gastrointestinal: Positive for nausea, vomiting and diarrhea. Negative for blood in stool.   Genitourinary: Negative for dysuria and hematuria.  Neurological: Negative for syncope and weakness.       Filed Vitals:   08/25/14 1523  BP: 121/78  Pulse: 78  Temp: 98.2 F (36.8 C)  TempSrc: Oral  Height: 5\' 4"  (1.626 m)  Weight: 207 lb 6.4 oz (94.076 kg)  SpO2: 100%   Objective:   Physical Exam  Constitutional: She is oriented to person, place, and time. She appears well-developed. No distress.  HENT:  Head: Normocephalic and atraumatic.  Mouth/Throat: Oropharynx is clear and moist. No oropharyngeal exudate.  Eyes: Conjunctivae and EOM are normal. Pupils are equal, round, and reactive to light.  Neck: Neck supple.  Cardiovascular: Normal rate, regular rhythm and normal heart sounds.  Exam reveals no gallop and no friction rub.   No murmur heard. Pulmonary/Chest: Effort normal and breath sounds normal. No respiratory distress. She has no wheezes. She has no rales.  Abdominal: Soft. Bowel sounds are normal. She exhibits no distension and no mass. There is no tenderness. There is no rebound and no guarding.  Musculoskeletal: Normal range of motion. She exhibits tenderness. She exhibits no edema.  Left knee has normal ROM.  + TTP medial joint line.  No crepitus.  No effusion.  Mild pain with valgus stress, no pain with varus stress.  Negative anterior/posterior draw.  No click and only mild pain with McMurrays.  Neurological: She is alert and oriented to person, place, and time. No cranial nerve deficit.  Skin: Skin is warm. She is not diaphoretic.  Psychiatric: She has a normal mood and affect. Her behavior is normal. Judgment and thought content normal.  Vitals reviewed.         Assessment & Plan:  Please see problem based charting A&P.

## 2014-08-25 NOTE — Patient Instructions (Signed)
1. For your knee pain:  Rest your knee Avoid squatting, kneeling, twisting Apply ice (wrapped in a towel) to your knee for 15 minutes at a time every six hours Use the knee sleeve that you were given Ok to take meloxicam once daily with food ONLY if you have severe knee pain.  Do not take ibuprofen, Aleve, naproxen while taking meloxicam.    If you are still having pain or you develop swelling, please let me know so we can consider getting an MRI of your knee.   2. Please take all medications as prescribed.   3. If you have worsening of your symptoms or new symptoms arise, please call the clinic ([phone]), or go to the ER immediately if symptoms are severe.   Please return to see me on 09/15/14.

## 2014-08-25 NOTE — Assessment & Plan Note (Addendum)
Tripped over baby gate at home about 3 weeks ago.  When she got up she felt a pop.  She had pain and swelling that improved with 5 day course of meloxicam but then returned two days ago.  She is able to bear weight, does not feel that knee will give out.  Ice, tylenol help also.  Neg anterior draw, posterior draw.  There is medial joint line tenderness.  Full ROM.  Minimal pain with valgus stress.  Indeterminate McMurrays (mild pain).  No pain with compression.  It is possible that she had a medial meniscal injury that is in the process of healing.  Good signs - she can bear weight, there is no effusion, knee does not feel like it will give out, there is normal ROM. - Continue conservative management for another 2-3 weeks:     Rest your knee     Avoid squatting, kneeling, twisting     Apply ice (wrapped in a towel) to your knee for 15 minutes at a time every six hours     Use the knee sleeve that you were given     Ok to take meloxicam once daily with food ONLY if you have severe knee pain.  Do not take ibuprofen, Aleve, naproxen while taking meloxicam.   - return to clinic in 3 weeks as previously scheduled - can consider MRI and ortho referral if she still has pain or swelling at that time

## 2014-08-25 NOTE — Assessment & Plan Note (Addendum)
BP Readings from Last 3 Encounters:  08/25/14 121/78  08/12/14 171/84  08/12/14 171/96    Lab Results  Component Value Date   NA 139 08/12/2014   K 4.0 08/12/2014   CREATININE 0.87 08/12/2014    Assessment: Blood pressure control:  well controlled Progress toward BP goal:   at goal Comments: she felt dizzy when she started HCTZ so she only took it for two days.  She then stopped HCTZ and dizziness resolved.  Plan: Medications:  continue current medications:  Ok to STOP HCTZ.  Continue lisinopril 40mg  daily, metoprolol 100mg  BID, lasix 20mg  daily. Educational resources provided: brochure (denies) Other plans:  Check BP next visit.

## 2014-08-26 ENCOUNTER — Encounter: Payer: Self-pay | Admitting: Neurology

## 2014-08-26 ENCOUNTER — Ambulatory Visit
Admission: RE | Admit: 2014-08-26 | Discharge: 2014-08-26 | Disposition: A | Discharge status: Home / Self Care | Payer: Medicare Other | Source: Ambulatory Visit | Attending: Neurology | Admitting: Neurology

## 2014-08-26 ENCOUNTER — Ambulatory Visit (INDEPENDENT_AMBULATORY_CARE_PROVIDER_SITE_OTHER): Payer: Medicare Other | Admitting: Neurology

## 2014-08-26 VITALS — BP 132/90 | HR 92 | Resp 16 | Ht 64.0 in | Wt 208.0 lb

## 2014-08-26 DIAGNOSIS — R51 Headache with orthostatic component, not elsewhere classified: Secondary | ICD-10-CM

## 2014-08-26 DIAGNOSIS — E669 Obesity, unspecified: Secondary | ICD-10-CM | POA: Diagnosis not present

## 2014-08-26 DIAGNOSIS — R112 Nausea with vomiting, unspecified: Secondary | ICD-10-CM

## 2014-08-26 DIAGNOSIS — I671 Cerebral aneurysm, nonruptured: Secondary | ICD-10-CM

## 2014-08-26 DIAGNOSIS — R110 Nausea: Secondary | ICD-10-CM | POA: Diagnosis not present

## 2014-08-26 DIAGNOSIS — R351 Nocturia: Secondary | ICD-10-CM | POA: Diagnosis not present

## 2014-08-26 DIAGNOSIS — G4719 Other hypersomnia: Secondary | ICD-10-CM | POA: Diagnosis not present

## 2014-08-26 DIAGNOSIS — H9312 Tinnitus, left ear: Secondary | ICD-10-CM

## 2014-08-26 DIAGNOSIS — R519 Headache, unspecified: Secondary | ICD-10-CM

## 2014-08-26 DIAGNOSIS — H539 Unspecified visual disturbance: Secondary | ICD-10-CM

## 2014-08-26 DIAGNOSIS — G4733 Obstructive sleep apnea (adult) (pediatric): Secondary | ICD-10-CM

## 2014-08-26 DIAGNOSIS — G932 Benign intracranial hypertension: Secondary | ICD-10-CM

## 2014-08-26 LAB — GLUCOSE, CAPILLARY: Glucose-Capillary: 155 mg/dL — ABNORMAL HIGH (ref 65–99)

## 2014-08-26 MED ORDER — GADOBENATE DIMEGLUMINE 529 MG/ML IV SOLN
15.0000 mL | Freq: Once | INTRAVENOUS | Status: AC | PRN
  Administered 2014-08-26: 15 mL via INTRAVENOUS

## 2014-08-26 NOTE — Progress Notes (Signed)
[facility] Medicine Clinic Attending  Case discussed with Dr. Wilson soon after the resident saw the patient.  We reviewed the resident's history and exam and pertinent patient test results.  I agree with the assessment, diagnosis, and plan of care documented in the resident's note.  

## 2014-08-26 NOTE — Patient Instructions (Addendum)

## 2014-08-26 NOTE — Progress Notes (Signed)
Subjective:    Patient ID: [name] is a 49 y.o. female.  HPI       Dear Berta Minor,   I saw your  patient, Danely Bayliss, upon your kind request in my clinic today for initial consultation of her sleep disorder, in particular, concern for underlying obstructive sleep apnea. The patient is unaccompanied today. As you know, Ms. Amaro is a 49 year old right-handed woman with an underlying medical history of pseudotumor cerebri, hypertension, hyperlipidemia, reflux disease, anemia, alcohol abuse, schizoaffective disorder, type 2 diabetes, diastolic dysfunction, pulmonary hypertension, smoking, and obesity, who reports snoring, excessive daytime somnolence, morning headaches, nocturia, and waking up with a sense of gasping. She was supposed to have a sleep study in the past but felt nervous about it and it never came to fruition. Her ESS is 20/24 today and Fatigue Score is 55/63 today. She smokes about a pack per day.  She has noted some difficulty with falling asleep in the last week and has been taking 2 benadryl each night in the last week. She describes a frontal dull, achy pain in the mornings. She has a morning headaches about 3 times a week. Her son lives with her and has noted pauses in her breathing and may come and check on her at night. She no longer drinks alcohol. She drinks 2 cans of Pepsi and 1 liter of sweet tea per day. She goes not bed at 9 PM and rise time is 6:30 AM. She has multiple night time awakenings and goes to the bathroom once per night. She denies restless leg symptoms. She is not sure if she twitches in her sleep. She has a family history of obstructive sleep apnea in one maternal uncle. She has been coughing at night.  Her Past Medical History Is Significant For: Past Medical History  Diagnosis Date  . Arteriosclerotic cardiovascular disease (ASCVD)     Minimal at cath in [facility].stress nuclear study in 8/08 with nl EF; neg stress echo in 2010  .  Diabetes mellitus, type 2 2000    Onset in 2000; no insulin  . Hyperlipidemia   . Hypertension `    during treatment with Geodon  . Gastroesophageal reflux disease     Schatzki's ring  . Anemia, iron deficiency   . Alcohol abuse   . Depression   . Community acquired pneumonia 01/03/10, 05/2010, 04/2012    2011; with pleural effusion-[facility] acute resp failure; intubated in Jan 2014 (HMPV pneumonia)  . Obesity   . Schizoaffective disorder     requiring multiple psychiatric admissions  . Dysphagia   . Diastolic dysfunction     grade 2 per echo 2011  . Pulmonary hypertension 05/02/2012    Patient needs repeat echo in 06/2012   . History of alcohol abuse 07/22/2007    Qualifier: Diagnosis of  By: Lenn Cal      Her Past Surgical History Is Significant For: Past Surgical History  Procedure Laterality Date  . Dilation and curettage, diagnostic / therapeutic  1992  . Esophagogastroduodenoscopy  09/16/08    Dr. Trevor Iha hiatal hernia/excoriations involving the cardia and mucosa consistent with trauma, antral erosions  of linear petechiae ? gastritis versus early gastric antral vascular  ectasia.Marland Kitchen biopsy showed reactive gastropathy. No H. pylori.  . Esophagogastroduodenoscopy  09/2007    Dr. Evalee Mutton ring, dilated to 22 French Maloney dilator, small hiatal hernia, antral erosions, biopsies reactive gastropathy.  Azzie Almas dilation  07/17/2011    [facility] sedation-->distal esophageal stricture  s/p dilation, chronic gastritis, multiple ulcers in stomach. no h.pylori  . Colonoscopy  01/2006    internal hemorrhoids  . Colonoscopy  01/10/2012    Dr. Rourk:Single anal canal hemorrhoidal tag likely source of  trivial hematochezia; right-sided colonic diverticulosis  . Esophagogastroduodenoscopy (egd) with propofol N/A 12/17/2013    YPP:JKDTOIZ antral erosions and petechiae. Small hiatal hernia. No endoscopic explanation for patient's symptoms    Her Family History Is Significant  For: Family History  Problem Relation Age of Onset  . Colon cancer Other   . Hypertension Mother   . Stroke Father     deceased at age 40  . Heart disease Sister   . Anesthesia problems Neg Hx   . Hypotension Neg Hx   . Malignant hyperthermia Neg Hx   . Pseudochol deficiency Neg Hx   . Diabetes    . High Cholesterol    . Arthritis      Her Social History Is Significant For: History   Social History  . Marital Status: Single    Spouse Name: N/A  . Number of Children: 2  . Years of Education: some colge   Occupational History  . Disability   . UNEMPLOYED    Social History Main Topics  . Smoking status: Current Every Day Smoker -- 1.00 packs/day for 30 years    Types: Cigarettes    Start date: 05/18/2012  . Smokeless tobacco: Never Used     Comment: cutting back,  . Alcohol Use: No     Comment: hx of ETOH abuse  . Drug Use: No  . Sexual Activity: Not on file   Other Topics Concern  . 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   Caffeine use: 2 sodas per day    Her Allergies Are:  Allergies  Allergen Reactions  . Cephalexin     Trouble breathing, felt like she had bumps in her throat.  . Metronidazole Shortness Of Breath and Swelling  . Orange Itching  . Shrimp [Shellfish Allergy] Shortness Of Breath and Itching    Takes Benadryl before eating shrimp.  Marland Kitchen Penicillins Hives and Swelling    Fever as well  . Sulfonamide Derivatives Hives    fever  . Glipizide Other (See Comments)    psychosis  . Sulfamethoxazole-Trimethoprim Rash  :   Her Current Medications Are:  Outpatient Encounter Prescriptions as of 08/26/2014  Medication Sig  . acetaminophen (TYLENOL) 500 MG tablet Take 1,000 mg by mouth every 6 (six) hours as needed for  mild pain.  Marland Kitchen albuterol (PROVENTIL HFA;VENTOLIN HFA) 108 (90 BASE) MCG/ACT inhaler Inhale 2 puffs into the lungs 3 (three) times daily as needed for wheezing or shortness of breath.  Marland Kitchen albuterol (PROVENTIL) (2.5 MG/3ML) 0.083% nebulizer solution Take 3 mLs (2.5 mg total) by nebulization every 6 (six) hours as needed for wheezing or shortness of breath.  . benzonatate (TESSALON PERLES) 100 MG capsule Take 1 capsule (100 mg total) by mouth every 6 (six) hours as needed for cough.  . doxycycline (VIBRAMYCIN) 100 MG capsule Take 1 capsule (100 mg total) by mouth 2 (two) times daily.  . ferrous sulfate 325 (65 FE) MG tablet Take 1 tablet (325 mg total) by mouth 3 (three) times daily with meals.  . furosemide (LASIX) 20  MG tablet Take 1 tablet (20 mg total) by mouth daily.  Marland Kitchen guaiFENesin-codeine (ROBITUSSIN AC) 100-10 MG/5ML syrup Take 5 mLs by mouth 3 (three) times daily as needed for cough.  Marland Kitchen HYDROcodone-acetaminophen (NORCO) 5-325 MG per tablet Take 1 tablet by mouth every 6 (six) hours as needed for moderate pain.  . hydrOXYzine (ATARAX/VISTARIL) 10 MG tablet Take 10 mg by mouth daily.  Marland Kitchen ibuprofen (ADVIL,MOTRIN) 200 MG tablet Take 400 mg by mouth every 6 (six) hours as needed for mild pain.  . Insulin Glargine (LANTUS SOLOSTAR) 100 UNIT/ML Solostar Pen Inject 22 Units into the skin at bedtime. (Patient taking differently: Inject 24 Units into the skin at bedtime. )  . lisinopril (PRINIVIL,ZESTRIL) 40 MG tablet Take 1 tablet (40 mg total) by mouth daily.  Marland Kitchen loperamide (IMODIUM A-D) 2 MG tablet Take 1 tablet (2 mg total) by mouth 4 (four) times daily as needed for diarrhea or loose stools.  Marland Kitchen loratadine (CLARITIN) 10 MG tablet Take 10 mg by mouth daily.  Marland Kitchen lovastatin (MEVACOR) 20 MG tablet Take 1 tablet (20 mg total) by mouth at bedtime.  . meclizine (ANTIVERT) 50 MG tablet Take 1 tablet (50 mg total) by mouth 3 (three) times daily as needed for dizziness.  . meloxicam (MOBIC) 15 MG tablet Take 1  tablet (15 mg total) by mouth daily. For 5 days  . metFORMIN (GLUCOPHAGE) 1000 MG tablet TAKE (1) TABLET BY MOUTH TWICE DAILY WITH MEALS.  . metoprolol (LOPRESSOR) 100 MG tablet Take 1 tablet (100 mg total) by mouth 2 (two) times daily.  . mometasone (NASONEX) 50 MCG/ACT nasal spray Place 2 sprays into the nose 2 (two) times daily.  Marland Kitchen NEXIUM 40 MG capsule TAKE ONE CAPSULE BY MOUTH TWICE DAILY BEFORE A MEAL.  Marland Kitchen NOVOFINE 32G X 6 MM MISC   . ofloxacin (OCUFLOX) 0.3 % ophthalmic solution Place 1 drop into the right eye 4 (four) times daily.  . ondansetron (ZOFRAN ODT) 4 MG disintegrating tablet 4mg  ODT q4 hours prn nausea/vomit  . potassium chloride SA (K-DUR,KLOR-CON) 20 MEQ tablet Take 1 tablet (20 mEq total) by mouth 2 (two) times daily.  . promethazine (PHENERGAN) 25 MG suppository 25 mg.  . saccharomyces boulardii (FLORASTOR) 250 MG capsule Take 1 capsule (250 mg total) by mouth 2 (two) times daily.  . sucralfate (CARAFATE) 1 G tablet Take 1 g by mouth 4 (four) times daily -  with meals and at bedtime.  . ziprasidone (GEODON) 80 MG capsule Take 1 capsule (80 mg total) by mouth 2 (two) times daily with a meal.  . topiramate (TOPAMAX) 50 MG tablet Take 1 tablet (50 mg total) by mouth 2 (two) times daily. (Patient not taking: Reported on 08/26/2014)   Facility-Administered Encounter Medications as of 08/26/2014  Medication  . 0.9 %  sodium chloride infusion  . ondansetron (ZOFRAN) injection 4 mg  . pantoprazole (PROTONIX) injection 40 mg  :  Review of Systems:  Out of a complete 14 point review of systems, all are reviewed and negative with the exception of these symptoms as listed below:   Review of Systems  Constitutional: Positive for fatigue.       Weight loss  Eyes:       Blurred vision  Respiratory: Positive for cough and shortness of breath.   Cardiovascular: Positive for chest pain.  Gastrointestinal: Positive for diarrhea.  Neurological:       Memory loss, Headaches, Dizziness,  Trouble falling asleep at night, wakes up many times during  the night, witnessed coughing and apnea during sleep, snoring, wakes up feeling tired in the morning.   Hematological:       Anemia  Psychiatric/Behavioral:       Depression, Anxiety, Not enough sleep, Change in appetite    Objective:  Neurologic Exam  Physical Exam Physical Examination:   Filed Vitals:   08/26/14 1027  BP: 132/90  Pulse: 92  Resp: 16    General Examination: The patient is a very pleasant 49 y.o. female in no acute distress. She appears well-developed and well-nourished and adequately groomed.   HEENT: Normocephalic, atraumatic, pupils are equal, round and reactive to light and accommodation. Funduscopic exam is normal with sharp disc margins noted. Extraocular tracking is good without limitation to gaze excursion or nystagmus noted. Normal smooth pursuit is noted. Hearing is grossly intact. Tympanic membranes are clear bilaterally. Face is symmetric with normal facial animation and normal facial sensation. Speech is clear with no dysarthria noted. There is no hypophonia. There is no lip, neck/head, jaw or voice tremor. Neck is supple with full range of passive and active motion. There are no carotid bruits on auscultation. Oropharynx exam reveals: moderate mouth dryness, tongue is heavily coated. There is adequate dental hygiene and moderate airway crowding, due to narrow airway entry, elongated tongue, tonsils in place. Mallampati is class III. She has a sensitive gag reflex. Tongue protrudes centrally and palate elevates symmetrically. Neck size is 15 inches. She has a Mild overbite. Nasal inspection reveals no significant nasal mucosal bogginess or redness and no septal deviation.   Chest: Clear to auscultation without wheezing, rhonchi or crackles noted.  Heart: S1+S2+0, regular and normal without murmurs, rubs or gallops noted.   Abdomen: Soft, non-tender and non-distended with normal bowel sounds  appreciated on auscultation.  Extremities: There is no pitting edema in the distal lower extremities bilaterally. Pedal pulses are intact.  Skin: Warm and dry without trophic changes noted. There are no varicose veins.  Musculoskeletal: exam reveals no obvious joint deformities, tenderness or joint swelling or erythema.   Neurologically:  Mental status: The patient is awake, alert and oriented in all 4 spheres. Her immediate and remote memory, attention, language skills and fund of knowledge are appropriate. There is no evidence of aphasia, agnosia, apraxia or anomia. Speech is clear with normal prosody and enunciation. Thought process is linear. Mood is normal and affect is blunted.  Cranial nerves II - XII are as described above under HEENT exam. In addition: shoulder shrug is normal with equal shoulder height noted. Motor exam: Normal bulk, strength and tone is noted. There is no drift, tremor or rebound. Romberg is negative. Reflexes are 2+ throughout. Babinski: Toes are flexor bilaterally. Fine motor skills and coordination: intact with normal finger taps, normal hand movements, normal rapid alternating patting, normal foot taps and normal foot agility.  Cerebellar testing: No dysmetria or intention tremor on finger to nose testing. Heel to shin is unremarkable bilaterally. There is no truncal or gait ataxia.  Sensory exam: intact to light touch, pinprick, vibration, temperature sense in the upper and lower extremities.  Gait, station and balance: She stands easily. No veering to one side is noted. No leaning to one side is noted. Posture is age-appropriate and stance is narrow based. Gait shows normal stride length and normal pace. No problems turning are noted. She turns en bloc. Tandem walk is unremarkable.   Assessment and Plan:   In summary, Analaya Hoey Arenz is a very pleasant 49 y.o.-year old female  with an underlying medical history of pseudotumor cerebri, hypertension, hyperlipidemia,  reflux disease, anemia, alcohol abuse, schizoaffective disorder, type 2 diabetes, diastolic dysfunction, pulmonary hypertension, smoking, and obesity, who reports snoring, excessive daytime somnolence, morning headaches, nocturia, and waking up with a sense of gasping. Her history and physical exam are in keeping with obstructive sleep apnea (OSA). I had a long chat with the patient about my findings and the diagnosis of OSA, its prognosis and treatment options. We talked about medical treatments, surgical interventions and non-pharmacological approaches. I explained in particular the risks and ramifications of untreated moderate to severe OSA, especially with respect to developing cardiovascular disease down the Road, including congestive heart failure, difficult to treat hypertension, cardiac arrhythmias, or stroke. Even type 2 diabetes has, in part, been linked to untreated OSA. Symptoms of untreated OSA include daytime sleepiness, memory problems, mood irritability and mood disorder such as depression and anxiety, lack of energy, as well as recurrent headaches, especially morning headaches. We talked about smoking cessation and trying to maintain a healthy lifestyle in general, as well as the importance of weight control. I encouraged the patient to eat healthy, exercise daily and keep well hydrated, to keep a scheduled bedtime and wake time routine, to not skip any meals and eat healthy snacks in between meals. I advised the patient not to drive when feeling sleepy. I recommended the following at this time: sleep study with potential positive airway pressure titration. (We will score hypopneas at 4% and split the sleep study into diagnostic and treatment portion, if the estimated. 2 hour AHI is >15/h).   I explained the sleep test procedure to the patient and also outlined possible surgical and non-surgical treatment options of OSA, including the use of a custom-made dental device (which would require a  referral to a specialist dentist or oral surgeon), upper airway surgical options, such as pillar implants, radiofrequency surgery, tongue base surgery, and UPPP (which would involve a referral to an ENT surgeon). Rarely, jaw surgery such as mandibular advancement may be considered.  I also explained the CPAP treatment option to the patient, who indicated that she would be willing to try CPAP if the need arises. I explained the importance of being compliant with PAP treatment, not only for insurance purposes but primarily to improve Her symptoms, and for the patient's long term health benefit, including to reduce Her cardiovascular risks. I answered all her questions today and the patient was in agreement. I would like to see her back after the sleep study is completed and encouraged her to call with any interim questions, concerns, problems or updates.   Thank you very much for allowing me to participate in the care of this nice patient. If I can be of any further assistance to you please do not hesitate to talk to me.   Sincerely,   Star Age, MD, PhD  I spent 25 minutes in total face-to-face time with the patient, more than 50% of which was spent in counseling and coordination of care, reviewing test results, reviewing medication and discussing or reviewing the diagnosis of OSA, its prognosis and treatment options.

## 2014-08-27 ENCOUNTER — Other Ambulatory Visit: Payer: Self-pay | Admitting: Dietician

## 2014-08-27 ENCOUNTER — Telehealth: Payer: Self-pay | Admitting: Dietician

## 2014-08-27 DIAGNOSIS — E119 Type 2 diabetes mellitus without complications: Secondary | ICD-10-CM

## 2014-08-27 NOTE — Telephone Encounter (Signed)
Agree with DM education appt.  I will see her on 09/15/14 as well.

## 2014-08-27 NOTE — Progress Notes (Unsigned)
Patient requests referral for medical nutrition therapy

## 2014-08-27 NOTE — Telephone Encounter (Signed)
Wants assistance with changing diet and become vegetarian: afraid of low blood sugars if she tries to do it on her own. She says she drinks sugary drinks to keep her sugar up and wants to stop drinking them as well. Realizes that this doesn't make sense and she may need her medicine adjusted when she changes her diet to prevent low blood a sugar because her a1C is 7.1%. appointment scheduled for 09/15/14.

## 2014-08-30 ENCOUNTER — Telehealth: Payer: Self-pay

## 2014-08-30 NOTE — Telephone Encounter (Signed)
Attempted to reach pt concerning Normal MRI and MRA results, VM was full and was unable to leave message.  Thanks

## 2014-08-30 NOTE — Telephone Encounter (Signed)
Patient called/returning Joy's call. Please call and advise. Patient can be reached @ [phone]

## 2014-08-30 NOTE — Telephone Encounter (Signed)
VM left to inform pt that MRA was normal, pt was informed to called office back.  Thanks

## 2014-08-30 NOTE — Telephone Encounter (Signed)
Patient is returning your call about her MRI.  Please call back [phone].  Thanks!

## 2014-08-30 NOTE — Telephone Encounter (Signed)
VM to also inform pt the MRI was normal.  Thanks

## 2014-08-30 NOTE — Telephone Encounter (Signed)
Spoke with pt about normal MRA and MRI results

## 2014-08-30 NOTE — Telephone Encounter (Signed)
Patient called again. Please call and advise.

## 2014-09-01 ENCOUNTER — Ambulatory Visit (INDEPENDENT_AMBULATORY_CARE_PROVIDER_SITE_OTHER): Payer: Medicare Other | Admitting: Neurology

## 2014-09-01 VITALS — BP 166/96 | HR 99

## 2014-09-01 DIAGNOSIS — G4720 Circadian rhythm sleep disorder, unspecified type: Secondary | ICD-10-CM

## 2014-09-01 DIAGNOSIS — G4733 Obstructive sleep apnea (adult) (pediatric): Secondary | ICD-10-CM

## 2014-09-01 DIAGNOSIS — G479 Sleep disorder, unspecified: Secondary | ICD-10-CM

## 2014-09-01 NOTE — Sleep Study (Signed)
Please see the scanned sleep study interpretation located in the Procedure tab within the Chart Review section. 

## 2014-09-06 ENCOUNTER — Other Ambulatory Visit: Payer: Self-pay | Admitting: Internal Medicine

## 2014-09-06 NOTE — Addendum Note (Signed)
Addended by: Orson Gear on: 09/06/2014 10:23 AM   Modules accepted: Orders

## 2014-09-07 DIAGNOSIS — F319 Bipolar disorder, unspecified: Secondary | ICD-10-CM | POA: Diagnosis not present

## 2014-09-08 ENCOUNTER — Encounter: Payer: Self-pay | Admitting: Podiatry

## 2014-09-08 ENCOUNTER — Ambulatory Visit (INDEPENDENT_AMBULATORY_CARE_PROVIDER_SITE_OTHER): Payer: Medicare Other | Admitting: Podiatry

## 2014-09-08 VITALS — BP 128/72 | HR 80 | Temp 99.3°F | Resp 16

## 2014-09-08 DIAGNOSIS — B351 Tinea unguium: Secondary | ICD-10-CM

## 2014-09-08 DIAGNOSIS — M79676 Pain in unspecified toe(s): Secondary | ICD-10-CM

## 2014-09-08 DIAGNOSIS — L97301 Non-pressure chronic ulcer of unspecified ankle limited to breakdown of skin: Secondary | ICD-10-CM

## 2014-09-08 DIAGNOSIS — R0989 Other specified symptoms and signs involving the circulatory and respiratory systems: Secondary | ICD-10-CM | POA: Diagnosis not present

## 2014-09-08 NOTE — Progress Notes (Signed)
° °  Subjective:    Patient ID: [name], female    DOB: [birth year], 49 y.o.   MRN: [id number]  HPI  49 year old female presents the office today for diabetic risk assessment and for painful, elongated toenails and sores to her ankle. She denies any redness or drainage on the nail sites. She also states that she has sores on both of her ankles along the outside aspect which is in present for couple weeks. She denies any history of ulceration prior to this or tingling/numbness. She states her last HbA1c was 7.0 and her blood sugars to be brought in the low 100s. No other complaints at this time.    Review of Systems  Genitourinary: Positive for frequency.  Musculoskeletal:       Difficult walking (sometimes)  Allergic/Immunologic: Positive for environmental allergies and food allergies.  All other systems reviewed and are negative.      Objective:   Physical Exam AAO x3, NAD DP pulses palpable b/l, PT pulses decreased right > left.  Protective sensation intact with Derrel Nip monofilament, vibratory sensation intact, Achilles tendon reflex intact Nails appear to be hypertrophic, dystrophic, discolored, elongated, brittle 10. There is no surrounding erythema or drainage along the nail sites. There is tenderness to palpation along the nails bilaterally.  On bilateral lateral aspect of the ankles on the distal fibula they are superficial granular ulcerations which appear to have a scab formed on the left side. There is no probing, undermining, tunneling. No swelling erythema, ascending cellulitis. The right side as it appears slightly larger than left. There is no tenderness overlying this area. No areas of tenderness to bilateral lower extremities. MMT 5/5, ROM WNL.  No other open lesions or pre-ulcerative lesions.  No overlying edema, erythema, increase in warmth to bilateral lower extremities.  No pain with calf compression, swelling, warmth, erythema bilaterally.       Assessment & Plan:  68 shell female symptomatically onychomycosis, lateral ankle ulcerations along distal fibula -Treatment options discussed including all alternatives, risks, and complications -Nails sharply debrided x 10 without complications/bleeding.  -Recommended antibody ointment and a Band-Aid to the bilateral ankle ulcerations. These wounds do appear to be from pressure which may be due to shoes. She does that she did change shoes around the same time that these ulcerations appeared. I encouraged her to pay attention to see what shoes she wears to see if it abuts [name] area causing the wound. -Also given decreased pulses we'll obtain vascular studies.  -Follow-up after vascular studies or sooner if any palms are to arise. In the meantime call the office with any questions, concerns, change in symptoms.

## 2014-09-09 ENCOUNTER — Telehealth: Payer: Self-pay | Admitting: Neurology

## 2014-09-09 NOTE — Telephone Encounter (Signed)
Dr. Cathren Laine patient, seen by me on 08/26/14 and PSG on 09/01/14.   Please call and notify the patient that the recent sleep study did not show any significant obstructive sleep apnea. She has very mild OSA only during REM/dream sleep and minimal desaturations and intermittent snoring noted (which ranged from mild to at times loud).  Overall, she did not sleep very well or very much (less than 5 1/2 hours), but not because of a specific underlying sleep disorder.  I would recommend the following: sleep off your back and try to pursue weight loss. In addition: Please remember to try to maintain good sleep hygiene, which means: Keep a regular sleep and wake schedule, try not to exercise or have a meal within 2 hours of your bedtime, try to keep your bedroom conducive for sleep, that is, cool and dark, without light distractors such as an illuminated alarm clock, and refrain from watching TV right before sleep or in the middle of the night and do not keep the TV or radio on during the night. Also, try not to use or play on electronic devices at bedtime, such as your cell phone, tablet PC or laptop. If you like to read at bedtime on an electronic device, try to dim the background light as much as possible. Do not eat in the middle of the night.    We can play it by ear and she should follow up with Dr. Jaynee Eagles as planned.  Also, route or fax report to PCP and referring MD, if other than PCP.  Once you have spoken to patient, you can close this encounter.   Thanks,  Star Age, MD, PhD [facility])

## 2014-09-10 ENCOUNTER — Telehealth: Payer: Self-pay

## 2014-09-10 ENCOUNTER — Telehealth (HOSPITAL_COMMUNITY): Payer: Self-pay | Admitting: *Deleted

## 2014-09-10 NOTE — Telephone Encounter (Signed)
Patient is aware of results and suggestions. She reports continued trouble sleeping and stated that she has not been able to find anything OTC that helps her sleep. The last thing that she tried was a combination supplement with Melatonin, reports that it helped some. Patient is asking for a prescription medication to help with sleep. Dr. Rexene Alberts has declined this request, do you have any suggestions?

## 2014-09-10 NOTE — Telephone Encounter (Signed)
I mailed sleep hygene guidelines to patient.

## 2014-09-14 ENCOUNTER — Telehealth: Payer: Self-pay | Admitting: Internal Medicine

## 2014-09-14 NOTE — Telephone Encounter (Signed)
Call to patient to confirm appointment for 09/15/14 at 2:45 & 3:45 lmtcb

## 2014-09-15 ENCOUNTER — Ambulatory Visit (INDEPENDENT_AMBULATORY_CARE_PROVIDER_SITE_OTHER): Payer: Medicare Other | Admitting: Internal Medicine

## 2014-09-15 ENCOUNTER — Encounter: Payer: Self-pay | Admitting: Internal Medicine

## 2014-09-15 ENCOUNTER — Ambulatory Visit (INDEPENDENT_AMBULATORY_CARE_PROVIDER_SITE_OTHER): Payer: Medicare Other | Admitting: Dietician

## 2014-09-15 ENCOUNTER — Encounter: Payer: Self-pay | Admitting: Dietician

## 2014-09-15 VITALS — BP 118/77 | HR 69 | Temp 99.0°F | Ht 64.0 in | Wt 210.7 lb

## 2014-09-15 DIAGNOSIS — G4719 Other hypersomnia: Secondary | ICD-10-CM | POA: Diagnosis not present

## 2014-09-15 DIAGNOSIS — Z794 Long term (current) use of insulin: Secondary | ICD-10-CM | POA: Diagnosis not present

## 2014-09-15 DIAGNOSIS — F17200 Nicotine dependence, unspecified, uncomplicated: Secondary | ICD-10-CM

## 2014-09-15 DIAGNOSIS — I10 Essential (primary) hypertension: Secondary | ICD-10-CM

## 2014-09-15 DIAGNOSIS — Z713 Dietary counseling and surveillance: Secondary | ICD-10-CM | POA: Diagnosis not present

## 2014-09-15 DIAGNOSIS — M25562 Pain in left knee: Secondary | ICD-10-CM

## 2014-09-15 DIAGNOSIS — E119 Type 2 diabetes mellitus without complications: Secondary | ICD-10-CM

## 2014-09-15 DIAGNOSIS — F319 Bipolar disorder, unspecified: Secondary | ICD-10-CM

## 2014-09-15 LAB — GLUCOSE, CAPILLARY: Glucose-Capillary: 146 mg/dL — ABNORMAL HIGH (ref 65–99)

## 2014-09-15 MED ORDER — INSULIN GLARGINE 100 UNIT/ML SOLOSTAR PEN: 18.0000 [IU] | PEN_INJECTOR | Freq: Every day | SUBCUTANEOUS | Status: DC

## 2014-09-15 MED ORDER — LORATADINE 10 MG PO TABS: 10.0000 mg | ORAL_TABLET | Freq: Every day | ORAL | Status: DC

## 2014-09-15 NOTE — Assessment & Plan Note (Signed)
Lab Results  Component Value Date   HGBA1C 7.1 08/25/2014   HGBA1C 7.6 05/24/2014   HGBA1C 7.5 12/07/2013     Assessment: Diabetes control:  well controlled Progress toward A1C goal:   improving Comments: She did not bring meter.  From her memory AM fasting CBG is normally 90-100.  She has felt hypoglycemic (nervous, hungry) about 1-2x per week but did not check it.  It often happens at work when she does not eat much and does not check her CBG.    Plan: Medications:  continue current medications:  DECREASE insulin from 22 units qHS to 18 units qHS Home glucose monitoring: Frequency:   Timing:   Instruction/counseling given: reminded to bring blood glucose meter & log to each visit and discussed diet Educational resources provided: brochure (saw Educator today) Self management tools provided: copy of home glucose meter download Other plans: RTC in 3 months.

## 2014-09-15 NOTE — Assessment & Plan Note (Addendum)
  Assessment: Progress toward smoking cessation:   unchanged Barriers to progress toward smoking cessation:   addiction Comments: not ready to quit  Plan: Instruction/counseling given:  I counseled patient on the dangers of tobacco use, advised patient to stop smoking, and reviewed strategies to maximize success. Educational resources provided:  [employer] account manager) brochure (denies)

## 2014-09-15 NOTE — Progress Notes (Signed)
  Medical Nutrition Therapy:  Appt start time: 1525 end time:  1600.  Assessment:  Primary concerns today:wants to become a vegetarian to include seafood, but not eggs or milk  And keep her blood sugars from going low while doing this Patient plans on starting a vegetarian-like diet soon. Has questions about portion sizes and needs review on carbohydrate containing foods.  Preferred Learning Style: No preference indicated  Learning Readiness: Contemplating and possibly ready to make changes to her diet MEDICATIONS: LANTUS 23 units a day, glucophage 100 mg BID  BLOOD SUGARS: did not bring meter today, reports drinks soda when very hungry or feeling low   DIETARY INTAKE: Usual eating pattern includes 2-3 meals. 24-hr recall:  B ( AM): granola and milk   L ( PM): potato salad and meat Snk ( PM): banana D ( PM): fish twice a week, vegetables, mac and cheese Snk ( PM): soda  Usual physical activity: not discussed today, limited recently due to a torn meniscus  Estimated energy needs for weight loss ~1500-1700 calories/day ~ 180 g carbohydrates 60-80 g protein   Progress Towards Goal(s):  In progress.   Nutritional Diagnosis:  NB-1.1 Food and nutrition-related knowledge deficit As related to lack of prior sufficient nutrition information especially on diabetes  meal plannig and vegetarian nutrition. .  As evidenced by her report and lack of knnowlege of what foods contain carbs and what is a serving. her report and questions.    Intervention:  Nutrition education on carb consistency and transitioning to vegetarian diet. Coordination of care- suggest lowering insulin ~10% since patient is reporting syptoms of low blood sugar, is planning to change diet and her A1C indicates most likely a prandial rather than basal problem in her blood sugar.  Teaching Method Utilized: Visual,,Auditory,Hands on Handouts given during visit include:AVS, vegetarian nutrition handout, bean recipes Barriers to  learning/adherence to lifestyle change: competing values, support  Demonstrated degree of understanding via:  Teach Back   Monitoring/Evaluation:  Dietary intake, exercise, meter, and body weight in 3 week(s).

## 2014-09-15 NOTE — Patient Instructions (Signed)
Please make a follow up appointment for 3-4 weeks  It will help your blood sugar to eat about 3 carb choices at each meal and 1 for snacks   Sample meal plan is :  Breakfast- eat 3 servings carb foods  Lunch- eat 3 servings of carb foods Supper- Eat 3 servings of carb foods Snacks- can eat up to 1 serving carb foods (20 grams carb) for snacks between meals and bedtime.  You do not have to have but can keep sugar stable and prevent lows as well as hunger   Examples:  2 starch + 1 fruit = 3 carb choices    1 starch + 1 milk + 1 fruit = 3 carb choices    2 starch + 1 milk = 3 carb choices    3 starches = 3 carb choices  Veggies, healthy fats( Humus, avocado, nuts, pumpkin or sunflower seeds)  and most protein do not count- add them as needed.

## 2014-09-15 NOTE — Patient Instructions (Signed)
1. Please decrease your insulin to 18 units every night.  If your morning blood sugars start to run low (less than 80) or you are having lows during the day, you can decrease your insulin by 2 units at a time or call me if you are unsure what to take.   2. Please take all medications as prescribed.     3. If you have worsening of your symptoms or new symptoms arise, please call the clinic ([phone]), or go to the ER immediately if symptoms are severe.  Please come back to see me in 3 months.  Vegetarian Eating and Nutrition Vegetarian diets are designed for those people who prefer vegetarian eating for religious, ecologic, or personal reasons. These diets, which are often lower in cholesterol and saturated fats, can provide significant health benefits. They result in lower rates of obesity, diabetes, breast and colon cancers, and cardiovascular and gallbladder diseases.  There are several types of vegetarian diets, but all restrict animal proteins to some degree. Because animal proteins have important nutrients, vegetarians should make sure to get these nutrients from other types of foods. The main purpose of healthy vegetarian eating is to provide the energy, vitamins, and minerals for proper growth and health maintenance at any age.  WHAT DO I NEED TO KNOW ABOUT VEGETARIAN EATING AND NUTRITION? The following nutrients are found in animal products. It is important to make sure you get enough of these nutrients from your diet. If you think you are not getting the right nutrients or if you do not eat any animal products, talk with your health care provider or dietitian about taking supplements. Protein Your dietitian can help you determine your individual protein needs. Sources of protein include:  Beans, such as black beans or kidney beans, or other legumes, such as lentils and split peas. Soy products. Nuts, such as almonds, [place] nuts, and pecans. Seeds, such as sunflower seeds. Tofu, tempeh,  and hummus. Eggs, milk, and cheese. Vitamin B12 This vitamin is only found in:  Cheeses, fish, and eggs. It can also be found in some breakfast cereals and other prepared products that have added vitamin B12. If you eat no animal products, you should discuss taking a supplement with your health care provider or dietitian. Vitamin D Good sources of vitamin D include:  Cod liver oil. Fish, such as swordfish, salmon, and tuna. Dairy products. Orange juice. Fortified mushrooms. Cereals with added vitamin D. Iron Good sources of iron include:  Dark, leafy greens. Nuts. Beans. Grain products that have added iron, such as cereals. Tofu, tempeh, soybeans, and quinoa. Plant-based iron is better absorbed when eaten with vitamin C. For example, squeeze lemon juice over cooked greens like kale, chard, or spinach, or have a glass of orange juice with your meals. Omega-3 Fatty Acids Good sources of omega-3 fatty acids include:  Walnuts. Foods with added omega-3 fatty acids, such as eggs, milk, and juices. Flax seeds, canola oil, soybean oil, and tofu. Fish (cold water fatty fish such as salmon, sardines, mackerel, herring, lake trout, and albacore tuna). Calcium Good sources of calcium include:  Dark, leafy greens, such as kale, bok choy, Chinese cabbage, collard greens and mustard greens. Broccoli. Okra. Dairy products. Soy products with added calcium. Calcium-fortified breakfast cereals, calcium-fortified fruit juices, and figs. Zinc Good sources of zinc include:  Legumes. Dairy products. Wheat germ, cereals, and breads that have added zinc. Baked beans. Legumes, such as cashews, chickpeas, kidney beans, and green peas. Almonds and nut butters. Tofu  and other soy products. Document Released: 04/05/2003 Document Revised: 08/17/2013 Document Reviewed: 02/05/2013 [facility] Patient Information 2015 [place], [place]. This information is not intended to replace advice given to you by your health care provider.  Make sure you discuss any questions you have with your health care provider.

## 2014-09-15 NOTE — Progress Notes (Signed)
Subjective:    Patient ID: [name], female    DOB: 10-13-1965, 49 y.o.   MRN: [id number]  HPI Comments: [name] is a 49 year old woman with PMH as below here for follow-up of chronic conditions.  Please see problem based charting for A&P.    Past Medical History  Diagnosis Date  . Arteriosclerotic cardiovascular disease (ASCVD)     Minimal at cath in [facility].stress nuclear study in 8/08 with nl EF; neg stress echo in 2010  . Diabetes mellitus, type 2 2000    Onset in 2000; no insulin  . Hyperlipidemia   . Hypertension `    during treatment with Geodon  . Gastroesophageal reflux disease     Schatzki's ring  . Anemia, iron deficiency   . Alcohol abuse   . Depression   . Community acquired pneumonia 01/03/10, 05/2010, 04/2012    2011; with pleural effusion-[facility] acute resp failure; intubated in Jan 2014 (HMPV pneumonia)  . Obesity   . Schizoaffective disorder     requiring multiple psychiatric admissions  . Dysphagia   . Diastolic dysfunction     grade 2 per echo 2011  . Pulmonary hypertension 05/02/2012    Patient needs repeat echo in 06/2012   . History of alcohol abuse 07/22/2007    Qualifier: Diagnosis of  By: Lenn Cal     Current Outpatient Prescriptions on File Prior to Visit  Medication Sig Dispense Refill  . acetaminophen (TYLENOL) 500 MG tablet Take 1,000 mg by mouth every 6 (six) hours as needed for mild pain.    Marland Kitchen albuterol (PROVENTIL HFA;VENTOLIN HFA) 108 (90 BASE) MCG/ACT inhaler Inhale 2 puffs into the lungs 3 (three) times daily as needed for wheezing or shortness of breath. 1 Inhaler 2  . albuterol (PROVENTIL) (2.5 MG/3ML) 0.083% nebulizer solution Take 3 mLs (2.5 mg total) by nebulization every 6 (six) hours as needed for wheezing or shortness of breath. 75 mL 12  . benzonatate (TESSALON PERLES) 100 MG capsule Take 1 capsule (100 mg total) by mouth every 6 (six) hours as needed for cough. 30 capsule 1  . doxycycline (VIBRAMYCIN)  100 MG capsule Take 1 capsule (100 mg total) by mouth 2 (two) times daily. 20 capsule 0  . ferrous sulfate 325 (65 FE) MG tablet Take 1 tablet (325 mg total) by mouth 3 (three) times daily with meals. 90 tablet 3  . furosemide (LASIX) 20 MG tablet Take 1 tablet (20 mg total) by mouth daily. 30 tablet 6  . guaiFENesin-codeine (ROBITUSSIN AC) 100-10 MG/5ML syrup Take 5 mLs by mouth 3 (three) times daily as needed for cough. 120 mL 0  . HYDROcodone-acetaminophen (NORCO) 5-325 MG per tablet Take 1 tablet by mouth every 6 (six) hours as needed for moderate pain. 15 tablet 0  . hydrOXYzine (ATARAX/VISTARIL) 10 MG tablet Take 10 mg by mouth daily.    Marland Kitchen ibuprofen (ADVIL,MOTRIN) 200 MG tablet Take 400 mg by mouth every 6 (six) hours as needed for mild pain.    . Insulin Glargine (LANTUS SOLOSTAR) 100 UNIT/ML Solostar Pen Inject 22 Units into the skin at bedtime. (Patient taking differently: Inject 24 Units into the skin at bedtime. ) 15 mL 12  . lisinopril (PRINIVIL,ZESTRIL) 40 MG tablet Take 1 tablet (40 mg total) by mouth daily. 30 tablet 4  . loperamide (IMODIUM A-D) 2 MG tablet Take 1 tablet (2 mg total) by mouth 4 (four) times daily as needed for diarrhea or loose stools.  30 tablet 0  . loratadine (CLARITIN) 10 MG tablet Take 10 mg by mouth daily.    Marland Kitchen lovastatin (MEVACOR) 20 MG tablet Take 1 tablet (20 mg total) by mouth at bedtime. 90 tablet 3  . meclizine (ANTIVERT) 50 MG tablet Take 1 tablet (50 mg total) by mouth 3 (three) times daily as needed for dizziness. 30 tablet 0  . meloxicam (MOBIC) 15 MG tablet Take 1 tablet (15 mg total) by mouth daily. For 5 days 30 tablet 0  . metFORMIN (GLUCOPHAGE) 1000 MG tablet TAKE (1) TABLET BY MOUTH TWICE DAILY WITH MEALS. 60 tablet 6  . metoprolol (LOPRESSOR) 100 MG tablet Take 1 tablet (100 mg total) by mouth 2 (two) times daily. 60 tablet 6  . mometasone (NASONEX) 50 MCG/ACT nasal spray Place 2 sprays into the nose 2 (two) times daily. 17 g 12  . NEXIUM 40 MG  capsule TAKE ONE CAPSULE BY MOUTH TWICE DAILY BEFORE A MEAL. 60 capsule 0  . NOVOFINE 32G X 6 MM MISC     . ofloxacin (OCUFLOX) 0.3 % ophthalmic solution Place 1 drop into the right eye 4 (four) times daily. 5 mL 0  . ondansetron (ZOFRAN ODT) 4 MG disintegrating tablet 4mg  ODT q4 hours prn nausea/vomit 12 tablet 0  . potassium chloride SA (K-DUR,KLOR-CON) 20 MEQ tablet Take 1 tablet (20 mEq total) by mouth 2 (two) times daily. 60 tablet 5  . promethazine (PHENERGAN) 25 MG suppository 25 mg.    . saccharomyces boulardii (FLORASTOR) 250 MG capsule Take 1 capsule (250 mg total) by mouth 2 (two) times daily. 60 capsule 1  . sucralfate (CARAFATE) 1 G tablet Take 1 g by mouth 4 (four) times daily -  with meals and at bedtime.    . topiramate (TOPAMAX) 50 MG tablet Take 1 tablet (50 mg total) by mouth 2 (two) times daily. (Patient not taking: Reported on 08/26/2014) 60 tablet 6  . ziprasidone (GEODON) 80 MG capsule Take 1 capsule (80 mg total) by mouth 2 (two) times daily with a meal. 60 capsule 0   Current Facility-Administered Medications on File Prior to Visit  Medication Dose Route Frequency Provider Last Rate Last Dose  . 0.9 %  sodium chloride infusion   Intravenous Once Merryl Hacker, MD      . ondansetron [facility]) injection 4 mg  4 mg Intramuscular Once Merryl Hacker, MD      . pantoprazole (PROTONIX) injection 40 mg  40 mg Intravenous Once Merryl Hacker, MD        Review of Systems  Constitutional: Negative for fever, chills, appetite change and unexpected weight change.  Respiratory: Positive for shortness of breath and wheezing. Negative for cough.        Occasional breathing problem she "thinks is due to smoking."  Responds to inhaler use.  Uses her rescue inhaler about 1-2x per week but has not needed it in the past week.  +3 pillow orthopnea (stable for years).  Cardiovascular: Positive for chest pain. Negative for palpitations and leg swelling.       Occasional CP (after  smoking) lasting a few minutes resolving spontaneously.   Gastrointestinal: Positive for diarrhea. Negative for nausea, vomiting, abdominal pain, constipation and blood in stool.       No vomiting this week.  She says it has been a good week for her chronic N/V.  Endocrine: Negative for polydipsia and polyuria.  Genitourinary: Negative for dysuria and hematuria.  Neurological: Negative for syncope.  Filed Vitals:   09/15/14 1602  BP: 118/77  Pulse: 69  Temp: 99 F (37.2 C)  TempSrc: Oral  Height: 5\' 4"  (1.626 m)  Weight: 210 lb 11.2 oz (95.573 kg)  SpO2: 100%   Objective:   Physical Exam  Constitutional: She is oriented to person, place, and time. She appears well-developed. No distress.  HENT:  Head: Normocephalic and atraumatic.  Mouth/Throat: Oropharynx is clear and moist. No oropharyngeal exudate.  Eyes: EOM are normal. Pupils are equal, round, and reactive to light.  Neck: Neck supple.  Cardiovascular: Normal rate, regular rhythm and normal heart sounds.  Exam reveals no gallop and no friction rub.   No murmur heard. Pulmonary/Chest: Effort normal and breath sounds normal. No respiratory distress. She has no wheezes. She has no rales.  Abdominal: Soft. Bowel sounds are normal. She exhibits no distension and no mass. There is no tenderness. There is no rebound and no guarding.  Musculoskeletal: Normal range of motion. She exhibits no edema or tenderness.  Neurological: She is alert and oriented to person, place, and time. No cranial nerve deficit.  Skin: Skin is warm. She is not diaphoretic.  Psychiatric: She has a normal mood and affect. Her behavior is normal. Judgment and thought content normal.  Vitals reviewed.         Assessment & Plan:  Please see problem based charting for A&P.

## 2014-09-17 NOTE — Progress Notes (Signed)
[facility] Medicine Clinic Attending  Case discussed with Dr. Wilson soon after the resident saw the patient.  We reviewed the resident's history and exam and pertinent patient test results.  I agree with the assessment, diagnosis, and plan of care documented in the resident's note.  

## 2014-09-17 NOTE — Assessment & Plan Note (Signed)
Symptoms improving since I last saw her.   No swelling.

## 2014-09-17 NOTE — Assessment & Plan Note (Signed)
She reports stable mood, denies depression or mania symptoms.  She is compliant with Geodon and goes to [facility] for therapy/medications.

## 2014-09-17 NOTE — Assessment & Plan Note (Addendum)
No significant OSA on recent sleep study.  Neuro has evaluated her and recs for sleeping off back, weight loss and good sleep hygiene.  She still has issues with insomnia.  She says psych recently changed Geodon from BID to two pills nightly because she was so somnolent in the day.  She has been on this regimen for two weeks so will give it some more time to take effect.  We also discussed sleep hygiene.

## 2014-09-17 NOTE — Assessment & Plan Note (Signed)
BP Readings from Last 3 Encounters:  09/15/14 118/77  09/08/14 128/72  09/01/14 166/96    Lab Results  Component Value Date   NA 139 08/12/2014   K 4.0 08/12/2014   CREATININE 0.87 08/12/2014    Assessment: Blood pressure control:  well controlled Progress toward BP goal:   at goal Comments: Compliant with medications.  Plan: Medications:  continue current medications:  Lisinopril 40mg  daily, metoprolol 100mg  BID, Lasix 20mg  daily Educational resources provided: brochure (denies.) Other plans:  She is working on diet and exercise.  She plans to start vegetarian diet but still plans to eat fish.  We discussed diet and adding light exercise.

## 2014-09-20 ENCOUNTER — Other Ambulatory Visit: Payer: Self-pay | Admitting: *Deleted

## 2014-09-20 ENCOUNTER — Telehealth: Payer: Self-pay | Admitting: Dietician

## 2014-09-20 MED ORDER — GLUCOSE BLOOD VI STRP: ORAL_STRIP | Status: DC

## 2014-09-20 MED ORDER — ACCU-CHEK FASTCLIX LANCETS MISC: Status: DC

## 2014-09-20 NOTE — Telephone Encounter (Addendum)
called patient back to verify insulin dose was decreased per Dr. Dois Davenport last note.

## 2014-09-20 NOTE — Telephone Encounter (Signed)
Pt states she is completely out. Sent to dr Redmond Pulling and attending for quick refill

## 2014-09-20 NOTE — Telephone Encounter (Addendum)
Patient calls asking if it is safe for her to drink a gallon of water a day. Spoke with patient and also left her a message that only 1/2 gallon or 8 - oz glasses of water at most recommended and less than that is needed as she gets fluids from food and other beverages. She verbalized understanding. She also says she had low blood sugars and was very hungry when she first started to lower her calories, but then added fruit and is better now. Suggested that if her medicine is making her eat to not have low blood sugar she may want to consider discussing this with her doctor. We also discussed other lower glycemic and healthier options.

## 2014-09-21 NOTE — Telephone Encounter (Signed)
Ok.  She was instructed on how to decrease insulin if she develops lows.  She can reduce to 16 units for a few days if she has lows or feels hypoglycemic and continue to decrease prn.

## 2014-09-21 NOTE — Telephone Encounter (Signed)
lost 7#. Is taking 18 units of lantus and was when she had the low blood sugars. Not having lows now that she is eating small snacks between meals

## 2014-09-29 ENCOUNTER — Telehealth: Payer: Self-pay | Admitting: Dietician

## 2014-09-30 ENCOUNTER — Ambulatory Visit (INDEPENDENT_AMBULATORY_CARE_PROVIDER_SITE_OTHER): Payer: Medicare Other | Admitting: Dietician

## 2014-09-30 VITALS — Wt 200.1 lb

## 2014-09-30 DIAGNOSIS — Z713 Dietary counseling and surveillance: Secondary | ICD-10-CM

## 2014-09-30 DIAGNOSIS — E119 Type 2 diabetes mellitus without complications: Secondary | ICD-10-CM

## 2014-09-30 NOTE — Telephone Encounter (Signed)
Patient calls saying her blood sugars are good, not low or high and that she is trying to follow a meatless, 1200-1500 calorie diet and feels low energy at times. Wants help planning menus.appointment scheduled for 330 on 09-30-14

## 2014-09-30 NOTE — Patient Instructions (Signed)
You are doing great!   your weight is decreased by 10.5# in 15 days.  Your blood sugars are fine also.  Keep up the good work!

## 2014-10-02 ENCOUNTER — Encounter (HOSPITAL_COMMUNITY): Payer: Self-pay | Admitting: *Deleted

## 2014-10-02 ENCOUNTER — Emergency Department (HOSPITAL_COMMUNITY): Payer: Medicare Other

## 2014-10-02 ENCOUNTER — Other Ambulatory Visit: Payer: Self-pay

## 2014-10-02 ENCOUNTER — Emergency Department (HOSPITAL_COMMUNITY)
Admission: EM | Admit: 2014-10-02 | Discharge: 2014-10-02 | Disposition: A | Discharge status: Home / Self Care | Payer: Medicare Other | Attending: Emergency Medicine | Admitting: Emergency Medicine

## 2014-10-02 DIAGNOSIS — Z794 Long term (current) use of insulin: Secondary | ICD-10-CM | POA: Insufficient documentation

## 2014-10-02 DIAGNOSIS — R5383 Other fatigue: Secondary | ICD-10-CM | POA: Diagnosis not present

## 2014-10-02 DIAGNOSIS — F259 Schizoaffective disorder, unspecified: Secondary | ICD-10-CM | POA: Diagnosis not present

## 2014-10-02 DIAGNOSIS — K219 Gastro-esophageal reflux disease without esophagitis: Secondary | ICD-10-CM | POA: Diagnosis not present

## 2014-10-02 DIAGNOSIS — Z7951 Long term (current) use of inhaled steroids: Secondary | ICD-10-CM | POA: Diagnosis not present

## 2014-10-02 DIAGNOSIS — Z720 Tobacco use: Secondary | ICD-10-CM | POA: Insufficient documentation

## 2014-10-02 DIAGNOSIS — Z8701 Personal history of pneumonia (recurrent): Secondary | ICD-10-CM | POA: Insufficient documentation

## 2014-10-02 DIAGNOSIS — Z880 Allergy status to penicillin: Secondary | ICD-10-CM | POA: Insufficient documentation

## 2014-10-02 DIAGNOSIS — R079 Chest pain, unspecified: Secondary | ICD-10-CM | POA: Diagnosis not present

## 2014-10-02 DIAGNOSIS — R110 Nausea: Secondary | ICD-10-CM | POA: Diagnosis not present

## 2014-10-02 DIAGNOSIS — E669 Obesity, unspecified: Secondary | ICD-10-CM | POA: Insufficient documentation

## 2014-10-02 DIAGNOSIS — E119 Type 2 diabetes mellitus without complications: Secondary | ICD-10-CM | POA: Diagnosis not present

## 2014-10-02 DIAGNOSIS — I10 Essential (primary) hypertension: Secondary | ICD-10-CM | POA: Diagnosis not present

## 2014-10-02 DIAGNOSIS — R630 Anorexia: Secondary | ICD-10-CM | POA: Insufficient documentation

## 2014-10-02 DIAGNOSIS — D509 Iron deficiency anemia, unspecified: Secondary | ICD-10-CM | POA: Diagnosis not present

## 2014-10-02 DIAGNOSIS — Z79899 Other long term (current) drug therapy: Secondary | ICD-10-CM | POA: Insufficient documentation

## 2014-10-02 DIAGNOSIS — E785 Hyperlipidemia, unspecified: Secondary | ICD-10-CM | POA: Diagnosis not present

## 2014-10-02 DIAGNOSIS — R05 Cough: Secondary | ICD-10-CM | POA: Diagnosis not present

## 2014-10-02 DIAGNOSIS — I2510 Atherosclerotic heart disease of native coronary artery without angina pectoris: Secondary | ICD-10-CM | POA: Insufficient documentation

## 2014-10-02 LAB — CBC
HCT: 38.9 % (ref 36.0–46.0)
Hemoglobin: 13.1 g/dL (ref 12.0–15.0)
MCH: 26.4 pg (ref 26.0–34.0)
MCHC: 33.7 g/dL (ref 30.0–36.0)
MCV: 78.4 fL (ref 78.0–100.0)
PLATELETS: 225 10*3/uL (ref 150–400)
RBC: 4.96 MIL/uL (ref 3.87–5.11)
RDW: 16.4 % — ABNORMAL HIGH (ref 11.5–15.5)
WBC: 8.6 10*3/uL (ref 4.0–10.5)

## 2014-10-02 LAB — BASIC METABOLIC PANEL
Anion gap: 10 (ref 5–15)
BUN: 17 mg/dL (ref 6–20)
CHLORIDE: 103 mmol/L (ref 101–111)
CO2: 21 mmol/L — ABNORMAL LOW (ref 22–32)
CREATININE: 0.97 mg/dL (ref 0.44–1.00)
Calcium: 10.2 mg/dL (ref 8.9–10.3)
GFR calc Af Amer: >60 mL/min (ref >60)
GFR calc non Af Amer: >60 mL/min (ref >60)
Glucose, Bld: 178 mg/dL — ABNORMAL HIGH (ref 65–99)
Potassium: 4.3 mmol/L (ref 3.5–5.1)
SODIUM: 134 mmol/L — AB (ref 135–145)

## 2014-10-02 LAB — I-STAT TROPONIN, ED: TROPONIN I, POC: 0 ng/mL (ref 0.00–0.08)

## 2014-10-02 LAB — BRAIN NATRIURETIC PEPTIDE: B Natriuretic Peptide: 9.8 pg/mL (ref 0.0–100.0)

## 2014-10-02 LAB — CBG MONITORING, ED: Glucose-Capillary: 176 mg/dL — ABNORMAL HIGH (ref 65–99)

## 2014-10-02 NOTE — Discharge Instructions (Signed)
Fatigue Fatigue is a feeling of tiredness, lack of energy, lack of motivation, or feeling tired all the time. Having enough rest, good nutrition, and reducing stress will normally reduce fatigue. Consult your caregiver if it persists. The nature of your fatigue will help your caregiver to find out its cause. The treatment is based on the cause.  CAUSES  There are many causes for fatigue. Most of the time, fatigue can be traced to one or more of your habits or routines. Most causes fit into one or more of three general areas. They are: Lifestyle problems  Sleep disturbances.  Overwork.  Physical exertion.  Unhealthy habits.  Poor eating habits or eating disorders.  Alcohol and/or drug use .  Lack of proper nutrition (malnutrition). Psychological problems  Stress and/or anxiety problems.  Depression.  Grief.  Boredom. Medical Problems or Conditions  Anemia.  Pregnancy.  Thyroid gland problems.  Recovery from major surgery.  Continuous pain.  Emphysema or asthma that is not well controlled  Allergic conditions.  Diabetes.  Infections (such as mononucleosis).  Obesity.  Sleep disorders, such as sleep apnea.  Heart failure or other heart-related problems.  Cancer.  Kidney disease.  Liver disease.  Effects of certain medicines such as antihistamines, cough and cold remedies, prescription pain medicines, heart and blood pressure medicines, drugs used for treatment of cancer, and some antidepressants. SYMPTOMS  The symptoms of fatigue include:   Lack of energy.  Lack of drive (motivation).  Drowsiness.  Feeling of indifference to the surroundings. DIAGNOSIS  The details of how you feel help guide your caregiver in finding out what is causing the fatigue. You will be asked about your present and past health condition. It is important to review all medicines that you take, including prescription and non-prescription items. A thorough exam will be done.  You will be questioned about your feelings, habits, and normal lifestyle. Your caregiver may suggest blood tests, urine tests, or other tests to look for common medical causes of fatigue.  TREATMENT  Fatigue is treated by correcting the underlying cause. For example, if you have continuous pain or depression, treating these causes will improve how you feel. Similarly, adjusting the dose of certain medicines will help in reducing fatigue.  HOME CARE INSTRUCTIONS   Try to get the required amount of good sleep every night.  Eat a healthy and nutritious diet, and drink enough water throughout the day.  Practice ways of relaxing (including yoga or meditation).  Exercise regularly.  Make plans to change situations that cause stress. Act on those plans so that stresses decrease over time. Keep your work and personal routine reasonable.  Avoid street drugs and minimize use of alcohol.  Start taking a daily multivitamin after consulting your caregiver. SEEK MEDICAL CARE IF:   You have persistent tiredness, which cannot be accounted for.  You have fever.  You have unintentional weight loss.  You have headaches.  You have disturbed sleep throughout the night.  You are feeling sad.  You have constipation.  You have dry skin.  You have gained weight.  You are taking any new or different medicines that you suspect are causing fatigue.  You are unable to sleep at night.  You develop any unusual swelling of your legs or other parts of your body. SEEK IMMEDIATE MEDICAL CARE IF:   You are feeling confused.  Your vision is blurred.  You feel faint or pass out.  You develop severe headache.  You develop severe abdominal, pelvic, or  back pain.  You develop chest pain, shortness of breath, or an irregular or fast heartbeat.  You are unable to pass a normal amount of urine.  You develop abnormal bleeding such as bleeding from the rectum or you vomit blood.  You have thoughts  about harming yourself or committing suicide.  You are worried that you might harm someone else. MAKE SURE YOU:   Understand these instructions.  Will watch your condition.  Will get help right away if you are not doing well or get worse. Document Released: 01/28/2007 Document Revised: 06/25/2011 Document Reviewed: 08/04/2013 [facility] Patient Information 2015 [place], [place]. This information is not intended to replace advice given to you by your health care provider. Make sure you discuss any questions you have with your health care provider.  Please read attached information, contact your primary care provider follow-up in 3 days for reevaluation. Please return immediately if new or worsening signs or symptoms present

## 2014-10-02 NOTE — ED Notes (Signed)
MD already assessed pt.

## 2014-10-02 NOTE — ED Notes (Signed)
Pt states that she has had chest pain and muscle aches for several weeks. Pt states that this began after she tried to become a vegetarian.

## 2014-10-02 NOTE — ED Provider Notes (Signed)
CSN: [id number]     Arrival date & time 10/02/14  1342 History   None    Chief Complaint  Patient presents with  . Chest Pain   HPI   49 year old female presents today with generalized fatigue. Patient reports the last 3-4 months she has had generalized fatigue and nausea. She reports she is being followed closely by internal medicine, and nutrition counseling. She states that approximately 2 weeks ago she decided to become a vegetarian. She notes that since starting the diet she's had decreased appetite, no bleed, and progressive fatigue. She reports she's had approximately 5 pound weight loss over the last 2 weeks. She reports along with some fatigue she's had generalized body aches, no focal tenderness or specific pain. Patient reports she's had normal bowel movements, normal urine output. She reports decreased appetite, but still maintains by mouth intake. She denies any neurological complaints, including confusion, difficulty with ambulation, memory, or abnormal sensations. Patient has no acute concerns other than those noted above. She denies any shortness of breath specific chest pain other than generalized body aches, she does report she is a little nauseous, no vomiting, no headache, fever, shortness breath, night sweats.    Past Medical History  Diagnosis Date  . Arteriosclerotic cardiovascular disease (ASCVD)     Minimal at cath in [facility].stress nuclear study in 8/08 with nl EF; neg stress echo in 2010  . Diabetes mellitus, type 2 2000    Onset in 2000; no insulin  . Hyperlipidemia   . Hypertension `    during treatment with Geodon  . Gastroesophageal reflux disease     Schatzki's ring  . Anemia, iron deficiency   . Alcohol abuse   . Depression   . Community acquired pneumonia 01/03/10, 05/2010, 04/2012    2011; with pleural effusion-[facility] acute resp failure; intubated in Jan 2014 (HMPV pneumonia)  . Obesity   . Schizoaffective disorder     requiring  multiple psychiatric admissions  . Dysphagia   . Diastolic dysfunction     grade 2 per echo 2011  . Pulmonary hypertension 05/02/2012    Patient needs repeat echo in 06/2012   . History of alcohol abuse 07/22/2007    Qualifier: Diagnosis of  By: Lenn Cal     Past Surgical History  Procedure Laterality Date  . Dilation and curettage, diagnostic / therapeutic  1992  . Esophagogastroduodenoscopy  09/16/08    Dr. Trevor Iha hiatal hernia/excoriations involving the cardia and mucosa consistent with trauma, antral erosions  of linear petechiae ? gastritis versus early gastric antral vascular  ectasia.Marland Kitchen biopsy showed reactive gastropathy. No H. pylori.  . Esophagogastroduodenoscopy  09/2007    Dr. Evalee Mutton ring, dilated to 45 French Maloney dilator, small hiatal hernia, antral erosions, biopsies reactive gastropathy.  Azzie Almas dilation  07/17/2011    [facility] sedation-->distal esophageal stricture s/p dilation, chronic gastritis, multiple ulcers in stomach. no h.pylori  . Colonoscopy  01/2006    internal hemorrhoids  . Colonoscopy  01/10/2012    Dr. Rourk:Single anal canal hemorrhoidal tag likely source of  trivial hematochezia; right-sided colonic diverticulosis  . Esophagogastroduodenoscopy (egd) with propofol N/A 12/17/2013    TMA:UQJFHLK antral erosions and petechiae. Small hiatal hernia. No endoscopic explanation for patient's symptoms   Family History  Problem Relation Age of Onset  . Colon cancer Other   . Hypertension Mother   . Stroke Father     deceased at age 58  . Heart disease Sister   . Anesthesia problems  Neg Hx   . Hypotension Neg Hx   . Malignant hyperthermia Neg Hx   . Pseudochol deficiency Neg Hx   . Diabetes    . High Cholesterol    . Arthritis     History  Substance Use Topics  . Smoking status: Current Every Day Smoker -- 1.00 packs/day for 30 years    Types: Cigarettes    Start date: 05/18/2012  . Smokeless tobacco: Never Used     Comment: cutting  back,  . Alcohol Use: No     Comment: hx of ETOH abuse   OB History    Gravida Para Term Preterm AB TAB SAB Ectopic Multiple Living   3 2 2  1 1    2      Review of Systems  All other systems reviewed and are negative.   Allergies  Cephalexin; Metronidazole; Orange; Shrimp; Penicillins; Sulfonamide derivatives; Glipizide; and Sulfamethoxazole-trimethoprim  Home Medications   Prior to Admission medications   Medication Sig Start Date End Date Taking? Authorizing Provider  ACCU-CHEK FASTCLIX LANCETS MISC Use to check blood sugar 3 to 4 times daily. diag code E11.9. Insulin dependent 09/20/14   Bartholomew Crews, MD  acetaminophen (TYLENOL) 500 MG tablet Take 1,000 mg by mouth every 6 (six) hours as needed for mild pain.    Historical Provider, MD  albuterol (PROVENTIL HFA;VENTOLIN HFA) 108 (90 BASE) MCG/ACT inhaler Inhale 2 puffs into the lungs 3 (three) times daily as needed for wheezing or shortness of breath. 10/27/12   Rigoberto Noel, MD  albuterol (PROVENTIL) (2.5 MG/3ML) 0.083% nebulizer solution Take 3 mLs (2.5 mg total) by nebulization every 6 (six) hours as needed for wheezing or shortness of breath. 03/17/14   Otho Bellows, MD  benzonatate (TESSALON PERLES) 100 MG capsule Take 1 capsule (100 mg total) by mouth every 6 (six) hours as needed for cough. Patient not taking: Reported on 09/15/2014 08/14/14 08/14/15  Corky Sox, MD  ferrous sulfate 325 (65 FE) MG tablet Take 1 tablet (325 mg total) by mouth 3 (three) times daily with meals. Patient not taking: Reported on 09/15/2014 05/05/14 05/05/15  Norman Herrlich, MD  furosemide (LASIX) 20 MG tablet Take 1 tablet (20 mg total) by mouth daily. 07/13/14   Norman Herrlich, MD  glucose blood (ACCU-CHEK SMARTVIEW) test strip Use to check blood sugar 3 to 4 times daily. diag code E11.9. Insulin dependent. 09/20/14   Bartholomew Crews, MD  guaiFENesin-codeine [facility]) 100-10 MG/5ML syrup Take 5 mLs by mouth 3 (three) times daily as needed  for cough. Patient not taking: Reported on 09/15/2014 08/12/14   Milagros Loll, MD  hydrochlorothiazide (HYDRODIURIL) 12.5 MG tablet Take 12.5 mg by mouth daily. 08/12/14   Historical Provider, MD  HYDROcodone-acetaminophen (NORCO/VICODIN) 5-325 MG per tablet Take by mouth. 08/02/14   Historical Provider, MD  hydrOXYzine (ATARAX/VISTARIL) 10 MG tablet Take 10 mg by mouth daily. 06/22/14   Historical Provider, MD  ibuprofen (ADVIL,MOTRIN) 200 MG tablet Take 400 mg by mouth every 6 (six) hours as needed for mild pain.    Historical Provider, MD  Insulin Glargine (LANTUS SOLOSTAR) 100 UNIT/ML Solostar Pen Inject 18 Units into the skin at bedtime. 09/15/14   Francesca Oman, DO  lisinopril (PRINIVIL,ZESTRIL) 40 MG tablet Take 1 tablet (40 mg total) by mouth daily. 05/05/14   Norman Herrlich, MD  loperamide (IMODIUM A-D) 2 MG tablet Take 1 tablet (2 mg total) by mouth 4 (four) times daily as  needed for diarrhea or loose stools. 06/23/14   Norman Herrlich, MD  loratadine (CLARITIN) 10 MG tablet Take 1 tablet (10 mg total) by mouth daily. 09/15/14   Francesca Oman, DO  lovastatin (MEVACOR) 20 MG tablet Take 1 tablet (20 mg total) by mouth at bedtime. 05/05/14   Norman Herrlich, MD  meclizine (ANTIVERT) 50 MG tablet Take 1 tablet (50 mg total) by mouth 3 (three) times daily as needed for dizziness. Patient not taking: Reported on 09/15/2014 06/10/14   Daleen Bo, MD  meloxicam (MOBIC) 15 MG tablet Take 1 tablet (15 mg total) by mouth daily. For 5 days Patient not taking: Reported on 09/15/2014 08/02/14   Melony Overly, MD  metFORMIN (GLUCOPHAGE) 1000 MG tablet TAKE (1) TABLET BY MOUTH TWICE DAILY WITH MEALS. 05/05/14   Norman Herrlich, MD  metoprolol (LOPRESSOR) 100 MG tablet Take 1 tablet (100 mg total) by mouth 2 (two) times daily. 05/05/14   Norman Herrlich, MD  mometasone (NASONEX) 50 MCG/ACT nasal spray Place 2 sprays into the nose 2 (two) times daily. 02/22/14   Cresenciano Genre, MD  NEXIUM 40 MG capsule TAKE ONE CAPSULE BY  MOUTH TWICE DAILY BEFORE A MEAL. 08/23/14   Francesca Oman, DO  NOVOFINE 32G X 6 MM MISC  06/22/14   Historical Provider, MD  ofloxacin (OCUFLOX) 0.3 % ophthalmic solution  07/06/14   Historical Provider, MD  ondansetron (ZOFRAN ODT) 4 MG disintegrating tablet 4mg  ODT q4 hours prn nausea/vomit Patient not taking: Reported on 09/15/2014 05/04/14   Milton Ferguson, MD  potassium chloride SA (K-DUR,KLOR-CON) 20 MEQ tablet Take 1 tablet (20 mEq total) by mouth 2 (two) times daily. 07/27/14   Norman Herrlich, MD  saccharomyces boulardii (FLORASTOR) 250 MG capsule Take 1 capsule (250 mg total) by mouth 2 (two) times daily. Patient not taking: Reported on 09/15/2014 06/23/14   Norman Herrlich, MD  topiramate (TOPAMAX) 50 MG tablet Take 1 tablet (50 mg total) by mouth 2 (two) times daily. 08/12/14   Melvenia Beam, MD  ziprasidone (GEODON) 80 MG capsule Take 1 capsule (80 mg total) by mouth 2 (two) times daily with a meal. Patient taking differently: Take 160 mg by mouth at bedtime.  03/26/12   Patrecia Pour, NP   BP 118/83 mmHg  Pulse 77  Temp(Src) 98.6 F (37 C) (Oral)  Resp 16  SpO2 99%  LMP 09/15/2014 (Approximate)   Physical Exam  Constitutional: She is oriented to person, place, and time. She appears well-developed and well-nourished.  HENT:  Head: Normocephalic and atraumatic.  Mouth/Throat: Uvula is midline, oropharynx is clear and moist and mucous membranes are normal. No oropharyngeal exudate, posterior oropharyngeal edema, posterior oropharyngeal erythema or tonsillar abscesses.  Tongue normal  Eyes: EOM are normal. Pupils are equal, round, and reactive to light. No scleral icterus.  Neck: Normal range of motion. Neck supple. No JVD present. No tracheal deviation present. No thyromegaly present.  Cardiovascular: Normal rate, regular rhythm, normal heart sounds and intact distal pulses.  Exam reveals no gallop and no friction rub.   No murmur heard. Pulmonary/Chest: Effort normal and breath sounds  normal. No stridor. No respiratory distress. She has no wheezes. She has no rales. She exhibits no tenderness.  Abdominal: Soft. She exhibits no distension and no mass. There is no tenderness. There is no rebound and no guarding.  Musculoskeletal: Normal range of motion.  Lymphadenopathy:    She has no cervical adenopathy.  Neurological:  She is alert and oriented to person, place, and time. She has normal strength. No cranial nerve deficit or sensory deficit. Coordination and gait normal. GCS eye subscore is 4. GCS verbal subscore is 5. GCS motor subscore is 6.  Reflex Scores:      Patellar reflexes are 2+ on the right side and 2+ on the left side. Skin: Skin is warm and dry.  Psychiatric: She has a normal mood and affect. Her behavior is normal. Judgment and thought content normal.  Nursing note and vitals reviewed.   ED Course  Procedures (including critical care time) Labs Review Labs Reviewed  BASIC METABOLIC PANEL - Abnormal; Notable for the following:    Sodium 134 (*)    CO2 21 (*)    Glucose, Bld 178 (*)    All other components within normal limits  CBC - Abnormal; Notable for the following:    RDW 16.4 (*)    All other components within normal limits  CBG MONITORING, ED - Abnormal; Notable for the following:    Glucose-Capillary 176 (*)    All other components within normal limits  BRAIN NATRIURETIC PEPTIDE  I-STAT TROPOININ, ED    Imaging Review Dg Chest 2 View  10/02/2014   CLINICAL DATA:  Four day history of chest pain.  Recent dry cough  EXAM: CHEST  2 VIEW  COMPARISON:  June 10, 2014  FINDINGS: Lungs are clear. Heart size and pulmonary vascularity are normal. No adenopathy. No bone lesions.  IMPRESSION: No edema or consolidation.   Electronically Signed   By: Lowella Grip III M.D.   On: 10/02/2014 15:12     EKG Interpretation None      MDM   Final diagnoses:  Other fatigue   Labs: I-STAT troponin, CBC, NP, BNP, CBC- significant for elevated  glucose 176, within normal range for patient, sodium 134  Imaging: DG chest 2 view  Consults: None  Therapeutics: None  Assessment: Generalized fatigue  Plan: Patient presents with generalized fatigue after starting a new diet. This likely represents decreased caloric intake, she reports she's not eating meat potentially low B12 levels. Patient has no neurological complaints, confusion, or any other concerning signs or symptoms that would indicate further evaluation or management in the ED setting. Nursing note and chief complaint state chest pain, patient denies specific chest pain complaints, reports her whole body feels similar. Heart scores 3. No signs of anemia that would indicate iron deficiency. Patient has close follow-up with her internal medicine and nutritionist. She is instructed to contact them immediately following a scheduled evaluation. Strict return precautions given, encouraged to increase her diet including beans, meat, balanced diet. She verbalizes understanding and agreement for today's plan.      Okey Regal, PA-C 10/02/14 [place], MD 10/02/14 [phone]

## 2014-10-04 ENCOUNTER — Telehealth: Payer: Self-pay | Admitting: Internal Medicine

## 2014-10-04 ENCOUNTER — Telehealth: Payer: Self-pay | Admitting: *Deleted

## 2014-10-04 ENCOUNTER — Encounter: Payer: Self-pay | Admitting: Internal Medicine

## 2014-10-04 ENCOUNTER — Ambulatory Visit (INDEPENDENT_AMBULATORY_CARE_PROVIDER_SITE_OTHER): Payer: Medicare Other | Admitting: Internal Medicine

## 2014-10-04 DIAGNOSIS — R5383 Other fatigue: Secondary | ICD-10-CM | POA: Diagnosis not present

## 2014-10-04 DIAGNOSIS — R197 Diarrhea, unspecified: Secondary | ICD-10-CM

## 2014-10-04 DIAGNOSIS — D539 Nutritional anemia, unspecified: Secondary | ICD-10-CM | POA: Diagnosis not present

## 2014-10-04 DIAGNOSIS — F17211 Nicotine dependence, cigarettes, in remission: Secondary | ICD-10-CM

## 2014-10-04 DIAGNOSIS — E1165 Type 2 diabetes mellitus with hyperglycemia: Secondary | ICD-10-CM

## 2014-10-04 DIAGNOSIS — D509 Iron deficiency anemia, unspecified: Secondary | ICD-10-CM

## 2014-10-04 LAB — BASIC METABOLIC PANEL
BUN: 19 mg/dL (ref 6–23)
CALCIUM: 9.8 mg/dL (ref 8.4–10.5)
CO2: 20 mEq/L (ref 19–32)
Chloride: 102 mEq/L (ref 96–112)
Creat: 0.93 mg/dL (ref 0.50–1.10)
Glucose, Bld: 264 mg/dL — ABNORMAL HIGH (ref 70–99)
POTASSIUM: 4.5 meq/L (ref 3.5–5.3)
Sodium: 132 mEq/L — ABNORMAL LOW (ref 135–145)

## 2014-10-04 LAB — GLUCOSE, CAPILLARY: Glucose-Capillary: 306 mg/dL — ABNORMAL HIGH (ref 65–99)

## 2014-10-04 LAB — TSH: TSH: 1.464 u[IU]/mL (ref 0.350–4.500)

## 2014-10-04 LAB — VITAMIN B12: Vitamin B-12: >2000 pg/mL — ABNORMAL HIGH (ref 211–911)

## 2014-10-04 NOTE — Telephone Encounter (Signed)
Pt called wanted to be seen asap, rtc, no answer, lm for rtc

## 2014-10-04 NOTE — Assessment & Plan Note (Addendum)
Diarrhea could be 2/2 to her detox tea, recent dietary change (avoiding all meats and only eating vegetarian diet), also viral gastroenteritis.  Asked to stop the detox tea. Treat symptomatically with PO hydration (with caution as she has CHF).   Checked orthostat (normal).   Asked her to not take the lasix while she is having diarrhea. She hasn't been taking it since Friday. Check BMET to check electrolytes.

## 2014-10-04 NOTE — Patient Instructions (Signed)
We will check some labs today.  Will let you know if anything comes back abnormal.  Mean while, please drink some water (not too much, <1800 mL) to keep up with your diarrhea.  If you diarrhea doesn't resolve in next 2 days then please let us know. If you feels worse or have fevers then please come back to the clinic or go to the ED.

## 2014-10-04 NOTE — Progress Notes (Signed)
   Subjective:    Patient ID: [name], female    DOB: 04-11-1966, 49 y.o.   MRN: [id number]  HPI  49 yo female with DM II, HLD, HTN, GERD, iron def anemia, depression, shcizoaffective dz here for complaint of fatigue.  She put herself on a vegetarian diet for last few weeks, was also drinking a "detox" tea to lose weight. Per Dietician note, has been trying to lose weight with 1200-1500 calorie diet. ost 10.5 lb on 6/16 in 15 days.    On Friday 6/17 she started feeling low energy, feeling hot easily, diarrhea (non bloody). Stopped drinking the tea on Friday. Went to ED on Saturday. BMET and CBC was normal. Was told she could have B12 deficiency.she actually started taking B12 PO since ED discharge.  Denies any fever/chills/n/v. Diarrhea 3x yesterday and 2x today. No unusual food (other than her dietary change), no travel, no sick contacts.  No bleeding anywhere. Taking her PO iron.   Has been drinking gatorade to replete fluid. CBG today 300.   Review of Systems  Constitutional: Positive for fatigue. Negative for fever, chills and unexpected weight change.  HENT: Negative for congestion, sinus pressure and tinnitus.   Eyes: Negative for visual disturbance.  Respiratory: Negative for cough, choking, chest tightness and shortness of breath.   Cardiovascular: Negative for chest pain, palpitations and leg swelling.  Gastrointestinal: Positive for diarrhea. Negative for nausea, vomiting, abdominal pain, blood in stool and abdominal distention.  Endocrine: Negative.   Genitourinary: Negative for dysuria and hematuria.  Musculoskeletal: Negative for back pain and gait problem.  Neurological: Positive for dizziness. Negative for seizures and numbness.  Hematological: Negative.   Psychiatric/Behavioral: Negative.        Objective:   Physical Exam  Constitutional: She is oriented to person, place, and time. She appears well-developed and well-nourished. No distress.  HENT:    Head: Normocephalic and atraumatic.  Mouth/Throat: Oropharynx is clear and moist.  Eyes: EOM are normal. Pupils are equal, round, and reactive to light.  Neck: Normal range of motion.  Cardiovascular: Normal rate and regular rhythm.  Exam reveals no gallop and no friction rub.   No murmur heard. Pulmonary/Chest: Effort normal and breath sounds normal. She has no wheezes. She has no rales. She exhibits no tenderness.  Abdominal: Soft. Bowel sounds are normal. She exhibits no distension and no mass. There is no tenderness.  Musculoskeletal: Normal range of motion. She exhibits no edema or tenderness.  Neurological: She is alert and oriented to person, place, and time. No cranial nerve deficit. Coordination normal.  Skin: Skin is warm. She is not diaphoretic.    Filed Vitals:   10/04/14 1501  BP: 125/58  Pulse: 85  Temp: 98.1 F (36.7 C)   Orthostats negative.       Assessment & Plan:  See problem based a&p.

## 2014-10-04 NOTE — Assessment & Plan Note (Signed)
Unclear etiology. Started 3 days ago, in the setting of recent diet (stopped eating all red meats, also stopped smoking at the same time, and had limited carolie intake of 1200-1500). during this time also was drinking some "detox" tea. Started having diarrhea 3 days ago, nonbloody. No recent infection, no fever, no travel, no sick contacts.   Fatigue could be 2/2 to just being on a new diet and overdoing it. Also could be 2/2 to diarrhea. Also states easily feeling hot, nervous so worrisome for thyroid etiology too. 1 year ago B12 was borderline low, had iron def anemia but hgb checked in ED was normal.  - check B12, MMA level( b/c b12 was borderline low last time), check TSH, BMET (to make sure electrolytes are fine with diarrhea).  Asked her to drink water (not gatorade as she is diabetic), upto 1.8 L daily. Asked her to come back if diarrhea doesn't improve in 2-3 days or if she develops a fever.   Asked her to avoid the detox tea in the future. She is interested in B12 shots if her b12 level is low, she mentioned her sister takes it.

## 2014-10-04 NOTE — Telephone Encounter (Signed)
   Reason for call:   I received a call from Ms. [name] at 9.30 PM indicating that she feels ill- weak, back is heatting up without actual pain, feels her body is not getting enough Oxygen. She was recently on a vegetarian diet and feels that her B12 levels are very low. She ate only red meat today, because she says she was told that she should eat foods with b12.   She denies SOB, her temp today in clinic was normal, she denies any specific symptoms except that she feels tired, and worse than usual.   Periods are regular- last was 1 week ago, doubt menopausal symptoms making her feel hot, but still possible.   Pt was in clinic today and in the Ed 2 days ago. Lab work- Bmet and CBC, BNP all normal. TSH, Bmet and B12 level pending.  Pt is on a lot of meds- she stopped her topamax a few days ago. Also on Geodon prescribed by Psych, on BP meds and insulin. Blood sugars elevated today in clinic- 306, but she says she checked it 2 hrs ago- 150.   Pertinent Data:   Mostly Listed above. Problem lists- DM- controlled, Fatigue, Diarrhea- new, bipolar disorder.   Assessment / Plan / Recommendations:   Fatigue could be a combination of things- Recent 10Lbs weight loss- in 2 weeks , new diet, Diarrhea, possible TSH or actual b12 defc. Labs pending.  Pt encouraged to follow up with regards results. Also to eat a balanced diet and not just red meat.   Considering symptoms she has described to me and work up she, doesn't sound like she needs to be seen immediatetly or go to the ED or warrant hospital admission. She can follow up in clinic tomorrow. Will send message to physician who saw pt in clinic today and to front desk for follow up appt.  As always, pt is advised that if symptoms worsen or new symptoms arise, they should go to an urgent care facility or to to ER for further evaluation.   [facility] PGY-2, IMTS.   10/04/2014, 9:38 PM

## 2014-10-05 ENCOUNTER — Telehealth: Payer: Self-pay | Admitting: Internal Medicine

## 2014-10-05 ENCOUNTER — Emergency Department (HOSPITAL_COMMUNITY)
Admission: EM | Admit: 2014-10-05 | Discharge: 2014-10-05 | Discharge status: Against Medical Advice | Payer: Medicare Other | Attending: Emergency Medicine | Admitting: Emergency Medicine

## 2014-10-05 ENCOUNTER — Encounter (HOSPITAL_COMMUNITY): Payer: Self-pay | Admitting: *Deleted

## 2014-10-05 ENCOUNTER — Encounter: Payer: Self-pay | Admitting: Internal Medicine

## 2014-10-05 ENCOUNTER — Encounter (HOSPITAL_COMMUNITY): Payer: Self-pay

## 2014-10-05 DIAGNOSIS — R1110 Vomiting, unspecified: Secondary | ICD-10-CM | POA: Insufficient documentation

## 2014-10-05 DIAGNOSIS — Z720 Tobacco use: Secondary | ICD-10-CM | POA: Diagnosis not present

## 2014-10-05 DIAGNOSIS — R112 Nausea with vomiting, unspecified: Secondary | ICD-10-CM | POA: Diagnosis not present

## 2014-10-05 DIAGNOSIS — I2510 Atherosclerotic heart disease of native coronary artery without angina pectoris: Secondary | ICD-10-CM | POA: Diagnosis not present

## 2014-10-05 DIAGNOSIS — E669 Obesity, unspecified: Secondary | ICD-10-CM | POA: Insufficient documentation

## 2014-10-05 DIAGNOSIS — I10 Essential (primary) hypertension: Secondary | ICD-10-CM | POA: Diagnosis not present

## 2014-10-05 DIAGNOSIS — E119 Type 2 diabetes mellitus without complications: Secondary | ICD-10-CM | POA: Insufficient documentation

## 2014-10-05 DIAGNOSIS — R197 Diarrhea, unspecified: Secondary | ICD-10-CM | POA: Insufficient documentation

## 2014-10-05 HISTORY — DX: Post-traumatic stress disorder, unspecified: F43.10

## 2014-10-05 LAB — CBG MONITORING, ED: GLUCOSE-CAPILLARY: 166 mg/dL — AB (ref 65–99)

## 2014-10-05 NOTE — ED Notes (Signed)
Pt was called and no answer

## 2014-10-05 NOTE — Telephone Encounter (Signed)
Talked with pt - states Dr Genene Churn called her this AM about labwork. Pt states does not feel well -  states B12 is wrong. Pt states going to ER now. Offered an appt for 10/06/14 - going to ER. Hilda Blades Taunya Goral RN 10/05/14 3:15PM

## 2014-10-05 NOTE — ED Notes (Signed)
No answer when called name to go to room.

## 2014-10-05 NOTE — ED Notes (Signed)
Pt not in waiting area when went to recheck on pt and update vitals

## 2014-10-05 NOTE — ED Notes (Signed)
No answer when called name  

## 2014-10-05 NOTE — ED Notes (Signed)
Pt informed registration that she was leaving and was angry that since she came in by EMS she didn't think she needed to wait.

## 2014-10-05 NOTE — Telephone Encounter (Signed)
Patient calling saying she had read online about the lab test she had done yesterday and she did not think that her results were going to come back correct because she was not fasting. She is asking to speak with the head doctor about the issue.

## 2014-10-05 NOTE — Addendum Note (Signed)
Addended by: Dellia Nims on: 10/05/2014 04:22 PM   Modules accepted: Level of Service

## 2014-10-05 NOTE — ED Notes (Signed)
Pt reports recently started a vegan diet and was taking a "detox."  Pt says has been having diarrhea for the past few weeks and thinks may be dehydrated.

## 2014-10-05 NOTE — ED Notes (Addendum)
Pt c/o 1 episode of diarrhea and only urinated x 1. Pt c/o n/v.

## 2014-10-06 ENCOUNTER — Ambulatory Visit (HOSPITAL_COMMUNITY)
Admission: RE | Admit: 2014-10-06 | Discharge: 2014-10-06 | Disposition: A | Discharge status: Home / Self Care | Payer: Medicare Other | Source: Ambulatory Visit | Attending: Cardiovascular Disease | Admitting: Cardiovascular Disease

## 2014-10-06 DIAGNOSIS — R0989 Other specified symptoms and signs involving the circulatory and respiratory systems: Secondary | ICD-10-CM | POA: Insufficient documentation

## 2014-10-06 LAB — METHYLMALONIC ACID, SERUM: METHYLMALONIC ACID, QUANTITATIVE: 149 nmol/L (ref 0–378)

## 2014-10-06 NOTE — Progress Notes (Signed)
INTERNAL MEDICINE TEACHING ATTENDING ADDENDUM - Jamoni Hewes, MD: I reviewed and discussed at the time of visit with the resident Dr. Ahmed, the patient's medical history, physical examination, diagnosis and results of pertinent tests and treatment and I agree with the patient's care as documented.  

## 2014-10-07 ENCOUNTER — Other Ambulatory Visit: Payer: Self-pay | Admitting: *Deleted

## 2014-10-07 MED ORDER — ESOMEPRAZOLE MAGNESIUM 40 MG PO CPDR: DELAYED_RELEASE_CAPSULE | ORAL | Status: DC

## 2014-10-10 ENCOUNTER — Encounter (HOSPITAL_COMMUNITY): Payer: Self-pay | Admitting: Emergency Medicine

## 2014-10-10 ENCOUNTER — Emergency Department (HOSPITAL_COMMUNITY)
Admission: EM | Admit: 2014-10-10 | Discharge: 2014-10-10 | Disposition: A | Discharge status: Home / Self Care | Payer: Medicare Other | Attending: Emergency Medicine | Admitting: Emergency Medicine

## 2014-10-10 DIAGNOSIS — R51 Headache: Secondary | ICD-10-CM | POA: Diagnosis present

## 2014-10-10 DIAGNOSIS — Z8701 Personal history of pneumonia (recurrent): Secondary | ICD-10-CM | POA: Diagnosis not present

## 2014-10-10 DIAGNOSIS — I10 Essential (primary) hypertension: Secondary | ICD-10-CM | POA: Insufficient documentation

## 2014-10-10 DIAGNOSIS — R531 Weakness: Secondary | ICD-10-CM | POA: Insufficient documentation

## 2014-10-10 DIAGNOSIS — F259 Schizoaffective disorder, unspecified: Secondary | ICD-10-CM | POA: Insufficient documentation

## 2014-10-10 DIAGNOSIS — Z880 Allergy status to penicillin: Secondary | ICD-10-CM | POA: Diagnosis not present

## 2014-10-10 DIAGNOSIS — R110 Nausea: Secondary | ICD-10-CM | POA: Diagnosis not present

## 2014-10-10 DIAGNOSIS — E119 Type 2 diabetes mellitus without complications: Secondary | ICD-10-CM | POA: Diagnosis not present

## 2014-10-10 DIAGNOSIS — Z791 Long term (current) use of non-steroidal anti-inflammatories (NSAID): Secondary | ICD-10-CM | POA: Insufficient documentation

## 2014-10-10 DIAGNOSIS — R5383 Other fatigue: Secondary | ICD-10-CM | POA: Diagnosis not present

## 2014-10-10 DIAGNOSIS — E785 Hyperlipidemia, unspecified: Secondary | ICD-10-CM | POA: Diagnosis not present

## 2014-10-10 DIAGNOSIS — E669 Obesity, unspecified: Secondary | ICD-10-CM | POA: Diagnosis not present

## 2014-10-10 DIAGNOSIS — G44219 Episodic tension-type headache, not intractable: Secondary | ICD-10-CM | POA: Diagnosis not present

## 2014-10-10 DIAGNOSIS — R05 Cough: Secondary | ICD-10-CM | POA: Diagnosis not present

## 2014-10-10 DIAGNOSIS — R0789 Other chest pain: Secondary | ICD-10-CM | POA: Diagnosis not present

## 2014-10-10 DIAGNOSIS — Z7951 Long term (current) use of inhaled steroids: Secondary | ICD-10-CM | POA: Diagnosis not present

## 2014-10-10 DIAGNOSIS — Z79899 Other long term (current) drug therapy: Secondary | ICD-10-CM | POA: Insufficient documentation

## 2014-10-10 DIAGNOSIS — D509 Iron deficiency anemia, unspecified: Secondary | ICD-10-CM | POA: Insufficient documentation

## 2014-10-10 DIAGNOSIS — I2510 Atherosclerotic heart disease of native coronary artery without angina pectoris: Secondary | ICD-10-CM | POA: Diagnosis not present

## 2014-10-10 DIAGNOSIS — Z794 Long term (current) use of insulin: Secondary | ICD-10-CM | POA: Diagnosis not present

## 2014-10-10 DIAGNOSIS — Z720 Tobacco use: Secondary | ICD-10-CM | POA: Insufficient documentation

## 2014-10-10 DIAGNOSIS — K219 Gastro-esophageal reflux disease without esophagitis: Secondary | ICD-10-CM | POA: Insufficient documentation

## 2014-10-10 LAB — CBC
HCT: 36.6 % (ref 36.0–46.0)
HEMOGLOBIN: 12.1 g/dL (ref 12.0–15.0)
MCH: 26.1 pg (ref 26.0–34.0)
MCHC: 33.1 g/dL (ref 30.0–36.0)
MCV: 78.9 fL (ref 78.0–100.0)
Platelets: 231 10*3/uL (ref 150–400)
RBC: 4.64 MIL/uL (ref 3.87–5.11)
RDW: 17.2 % — ABNORMAL HIGH (ref 11.5–15.5)
WBC: 8.5 10*3/uL (ref 4.0–10.5)

## 2014-10-10 LAB — DIFFERENTIAL
BASOS ABS: 0 10*3/uL (ref 0.0–0.1)
BASOS PCT: 0 % (ref 0–1)
EOS PCT: 2 % (ref 0–5)
Eosinophils Absolute: 0.2 10*3/uL (ref 0.0–0.7)
Lymphocytes Relative: 41 % (ref 12–46)
Lymphs Abs: 3.5 10*3/uL (ref 0.7–4.0)
MONO ABS: 0.5 10*3/uL (ref 0.1–1.0)
MONOS PCT: 6 % (ref 3–12)
Neutro Abs: 4.3 10*3/uL (ref 1.7–7.7)
Neutrophils Relative %: 51 % (ref 43–77)

## 2014-10-10 LAB — PROTIME-INR
INR: 0.95 (ref 0.00–1.49)
Prothrombin Time: 12.9 seconds (ref 11.6–15.2)

## 2014-10-10 LAB — COMPREHENSIVE METABOLIC PANEL
ALBUMIN: 3.8 g/dL (ref 3.5–5.0)
ALT: 15 U/L (ref 14–54)
AST: 19 U/L (ref 15–41)
Alkaline Phosphatase: 59 U/L (ref 38–126)
Anion gap: 10 (ref 5–15)
BILIRUBIN TOTAL: 0.3 mg/dL (ref 0.3–1.2)
BUN: 11 mg/dL (ref 6–20)
CALCIUM: 9.5 mg/dL (ref 8.9–10.3)
CO2: 23 mmol/L (ref 22–32)
CREATININE: 1.04 mg/dL — AB (ref 0.44–1.00)
Chloride: 100 mmol/L — ABNORMAL LOW (ref 101–111)
GFR calc Af Amer: >60 mL/min (ref >60)
GFR calc non Af Amer: >60 mL/min (ref >60)
GLUCOSE: 158 mg/dL — AB (ref 65–99)
POTASSIUM: 4 mmol/L (ref 3.5–5.1)
Sodium: 133 mmol/L — ABNORMAL LOW (ref 135–145)
TOTAL PROTEIN: 6.5 g/dL (ref 6.5–8.1)

## 2014-10-10 LAB — CBG MONITORING, ED: Glucose-Capillary: 171 mg/dL — ABNORMAL HIGH (ref 65–99)

## 2014-10-10 LAB — I-STAT TROPONIN, ED: TROPONIN I, POC: 0 ng/mL (ref 0.00–0.08)

## 2014-10-10 LAB — APTT: APTT: 27 s (ref 24–37)

## 2014-10-10 MED ORDER — SODIUM CHLORIDE 0.9 % IV BOLUS (SEPSIS)
500.0000 mL | Freq: Once | INTRAVENOUS | Status: AC
  Administered 2014-10-10: 500 mL via INTRAVENOUS

## 2014-10-10 NOTE — ED Notes (Signed)
MD at bedside. 

## 2014-10-10 NOTE — ED Provider Notes (Signed)
CSN: [id number]     Arrival date & time 10/10/14  1906 History   First MD Initiated Contact with Patient 10/10/14 2008     Chief Complaint  Patient presents with  . Headache  . Weakness     (Consider location/radiation/quality/duration/timing/severity/associated sxs/prior Treatment) Patient is a 49 y.o. female presenting with weakness.  Weakness This is a new problem. The current episode started 1 to 4 weeks ago. The problem occurs constantly. The problem has been gradually worsening. Associated symptoms include chest pain (intermittent, pressure-like, lasting several minutes. No CP now, but had pain last night lasting 10 min.), coughing, fatigue, headaches, nausea and weakness. Pertinent negatives include no abdominal pain, chills, congestion, fever, neck pain, numbness, rash, sore throat or vomiting. Nothing aggravates the symptoms. She has tried nothing for the symptoms. The treatment provided no relief.   Was seen here 8 days ago for similar presentation and attributed to recent diet change. She denies any improvement of her symptoms since. She does endorse intermittent diarrhea due to detox. She denies any fevers or recent infections. No known history of cancer. Her thyroid was checked by her primary care provider and was within normal limits. Patient also endorses intermittent chest pain. She is currently chest pain-free but states she last felt chest pain last night lasting 10 minutes. No associated shortness of breath and diaphoresis or nausea.  Past Medical History  Diagnosis Date  . Arteriosclerotic cardiovascular disease (ASCVD)     Minimal at cath in [facility].stress nuclear study in 8/08 with nl EF; neg stress echo in 2010  . Diabetes mellitus, type 2 2000    Onset in 2000; no insulin  . Hyperlipidemia   . Hypertension `    during treatment with Geodon  . Gastroesophageal reflux disease     Schatzki's ring  . Anemia, iron deficiency   . Alcohol abuse   . Depression    . Community acquired pneumonia 01/03/10, 05/2010, 04/2012    2011; with pleural effusion-[facility] acute resp failure; intubated in Jan 2014 ([facility] pneumonia)  . Obesity   . Schizoaffective disorder     requiring multiple psychiatric admissions  . Dysphagia   . Diastolic dysfunction     grade 2 per echo 2011  . Pulmonary hypertension 05/02/2012    Patient needs repeat echo in 06/2012   . History of alcohol abuse 07/22/2007    Qualifier: Diagnosis of  By: Lenn Cal    . PTSD (post-traumatic stress disorder)    Past Surgical History  Procedure Laterality Date  . Dilation and curettage, diagnostic / therapeutic  1992  . Esophagogastroduodenoscopy  09/16/08    Dr. Trevor Iha hiatal hernia/excoriations involving the cardia and mucosa consistent with trauma, antral erosions  of linear petechiae ? gastritis versus early gastric antral vascular  ectasia.Marland Kitchen biopsy showed reactive gastropathy. No H. pylori.  . Esophagogastroduodenoscopy  09/2007    Dr. Evalee Mutton ring, dilated to 67 French Maloney dilator, small hiatal hernia, antral erosions, biopsies reactive gastropathy.  Azzie Almas dilation  07/17/2011    [facility] sedation-->distal esophageal stricture s/p dilation, chronic gastritis, multiple ulcers in stomach. no h.pylori  . Colonoscopy  01/2006    internal hemorrhoids  . Colonoscopy  01/10/2012    Dr. Rourk:Single anal canal hemorrhoidal tag likely source of  trivial hematochezia; right-sided colonic diverticulosis  . Esophagogastroduodenoscopy (egd) with propofol N/A 12/17/2013    MAY:OKHTXHF antral erosions and petechiae. Small hiatal hernia. No endoscopic explanation for patient's symptoms   Family History  Problem Relation  Age of Onset  . Colon cancer Other   . Hypertension Mother   . Stroke Father     deceased at age 62  . Heart disease Sister   . Anesthesia problems Neg Hx   . Hypotension Neg Hx   . Malignant hyperthermia Neg Hx   . Pseudochol deficiency Neg Hx   .  Diabetes    . High Cholesterol    . Arthritis     History  Substance Use Topics  . Smoking status: Current Every Day Smoker -- 1.00 packs/day for 30 years    Types: Cigarettes    Start date: 05/18/2012  . Smokeless tobacco: Never Used     Comment: cutting back,  . Alcohol Use: No     Comment: hx of ETOH abuse   OB History    Gravida Para Term Preterm AB TAB SAB Ectopic Multiple Living   3 2 2  1 1    2      Review of Systems  Constitutional: Positive for fatigue. Negative for fever, chills and appetite change.  HENT: Negative for congestion, ear pain, facial swelling, mouth sores and sore throat.   Eyes: Negative for visual disturbance.  Respiratory: Positive for cough. Negative for chest tightness and shortness of breath.   Cardiovascular: Positive for chest pain (intermittent, pressure-like, lasting several minutes. No CP now, but had pain last night lasting 10 min.). Negative for palpitations.  Gastrointestinal: Positive for nausea. Negative for vomiting, abdominal pain, diarrhea and blood in stool.  Endocrine: Negative for cold intolerance and heat intolerance.  Genitourinary: Negative for frequency, decreased urine volume and difficulty urinating.  Musculoskeletal: Negative for back pain, neck pain and neck stiffness.  Skin: Negative for rash.  Neurological: Positive for weakness and headaches. Negative for dizziness, light-headedness and numbness.  All other systems reviewed and are negative.     Allergies  Cephalexin; Metronidazole; Orange; Shrimp; Penicillins; Sulfonamide derivatives; Glipizide; and Sulfamethoxazole-trimethoprim  Home Medications   Prior to Admission medications   Medication Sig Start Date End Date Taking? Authorizing Provider  ACCU-CHEK FASTCLIX LANCETS MISC Use to check blood sugar 3 to 4 times daily. diag code E11.9. Insulin dependent 09/20/14   Bartholomew Crews, MD  acetaminophen (TYLENOL) 500 MG tablet Take 1,000 mg by mouth every 6 (six)  hours as needed for mild pain.    Historical Provider, MD  albuterol (PROVENTIL HFA;VENTOLIN HFA) 108 (90 BASE) MCG/ACT inhaler Inhale 2 puffs into the lungs 3 (three) times daily as needed for wheezing or shortness of breath. 10/27/12   Rigoberto Noel, MD  albuterol (PROVENTIL) (2.5 MG/3ML) 0.083% nebulizer solution Take 3 mLs (2.5 mg total) by nebulization every 6 (six) hours as needed for wheezing or shortness of breath. 03/17/14   Otho Bellows, MD  benzonatate (TESSALON PERLES) 100 MG capsule Take 1 capsule (100 mg total) by mouth every 6 (six) hours as needed for cough. Patient not taking: Reported on 09/15/2014 08/14/14 08/14/15  Corky Sox, MD  esomeprazole (NEXIUM) 40 MG capsule TAKE ONE CAPSULE BY MOUTH TWICE DAILY BEFORE A MEAL. 10/07/14   Sid Falcon, MD  ferrous sulfate 325 (65 FE) MG tablet Take 1 tablet (325 mg total) by mouth 3 (three) times daily with meals. Patient not taking: Reported on 09/15/2014 05/05/14 05/05/15  Norman Herrlich, MD  furosemide (LASIX) 20 MG tablet Take 1 tablet (20 mg total) by mouth daily. 07/13/14   Norman Herrlich, MD  glucose blood (ACCU-CHEK SMARTVIEW) test strip Use to  check blood sugar 3 to 4 times daily. diag code E11.9. Insulin dependent. 09/20/14   Bartholomew Crews, MD  guaiFENesin-codeine [facility]) 100-10 MG/5ML syrup Take 5 mLs by mouth 3 (three) times daily as needed for cough. Patient not taking: Reported on 09/15/2014 08/12/14   Milagros Loll, MD  hydrochlorothiazide (HYDRODIURIL) 12.5 MG tablet Take 12.5 mg by mouth daily. 08/12/14   Historical Provider, MD  HYDROcodone-acetaminophen (NORCO/VICODIN) 5-325 MG per tablet Take by mouth. 08/02/14   Historical Provider, MD  hydrOXYzine (ATARAX/VISTARIL) 10 MG tablet Take 10 mg by mouth daily. 06/22/14   Historical Provider, MD  ibuprofen (ADVIL,MOTRIN) 200 MG tablet Take 400 mg by mouth every 6 (six) hours as needed for mild pain.    Historical Provider, MD  Insulin Glargine (LANTUS SOLOSTAR) 100 UNIT/ML  Solostar Pen Inject 18 Units into the skin at bedtime. 09/15/14   Francesca Oman, DO  lisinopril (PRINIVIL,ZESTRIL) 40 MG tablet Take 1 tablet (40 mg total) by mouth daily. 05/05/14   Norman Herrlich, MD  loperamide (IMODIUM A-D) 2 MG tablet Take 1 tablet (2 mg total) by mouth 4 (four) times daily as needed for diarrhea or loose stools. 06/23/14   Norman Herrlich, MD  loratadine (CLARITIN) 10 MG tablet Take 1 tablet (10 mg total) by mouth daily. 09/15/14   Francesca Oman, DO  lovastatin (MEVACOR) 20 MG tablet Take 1 tablet (20 mg total) by mouth at bedtime. 05/05/14   Norman Herrlich, MD  meclizine (ANTIVERT) 50 MG tablet Take 1 tablet (50 mg total) by mouth 3 (three) times daily as needed for dizziness. Patient not taking: Reported on 09/15/2014 06/10/14   Daleen Bo, MD  meloxicam (MOBIC) 15 MG tablet Take 1 tablet (15 mg total) by mouth daily. For 5 days Patient not taking: Reported on 09/15/2014 08/02/14   Melony Overly, MD  metFORMIN (GLUCOPHAGE) 1000 MG tablet TAKE (1) TABLET BY MOUTH TWICE DAILY WITH MEALS. 05/05/14   Norman Herrlich, MD  metoprolol (LOPRESSOR) 100 MG tablet Take 1 tablet (100 mg total) by mouth 2 (two) times daily. 05/05/14   Norman Herrlich, MD  mometasone (NASONEX) 50 MCG/ACT nasal spray Place 2 sprays into the nose 2 (two) times daily. 02/22/14   Cresenciano Genre, MD  NOVOFINE 32G X 6 MM MISC  06/22/14   Historical Provider, MD  ofloxacin (OCUFLOX) 0.3 % ophthalmic solution  07/06/14   Historical Provider, MD  ondansetron (ZOFRAN ODT) 4 MG disintegrating tablet 4mg  ODT q4 hours prn nausea/vomit Patient not taking: Reported on 09/15/2014 05/04/14   Milton Ferguson, MD  potassium chloride SA (K-DUR,KLOR-CON) 20 MEQ tablet Take 1 tablet (20 mEq total) by mouth 2 (two) times daily. 07/27/14   Norman Herrlich, MD  saccharomyces boulardii (FLORASTOR) 250 MG capsule Take 1 capsule (250 mg total) by mouth 2 (two) times daily. Patient not taking: Reported on 09/15/2014 06/23/14   Norman Herrlich, MD  topiramate  (TOPAMAX) 50 MG tablet Take 1 tablet (50 mg total) by mouth 2 (two) times daily. 08/12/14   Melvenia Beam, MD  ziprasidone (GEODON) 80 MG capsule Take 1 capsule (80 mg total) by mouth 2 (two) times daily with a meal. Patient taking differently: Take 160 mg by mouth at bedtime.  03/26/12   Patrecia Pour, NP   BP 138/73 mmHg  Pulse 73  Temp(Src) 98 F (36.7 C) (Oral)  Resp 16  Ht 5\' 4"  (1.626 m)  Wt 204 lb 14.4 oz (  92.942 kg)  BMI 35.15 kg/m2  SpO2 95%  LMP 09/15/2014 (Approximate) Physical Exam  Constitutional: She is oriented to person, place, and time. She appears well-developed and well-nourished. No distress.  HENT:  Head: Normocephalic and atraumatic.  Right Ear: External ear normal.  Left Ear: External ear normal.  Nose: Nose normal.  Eyes: Conjunctivae and EOM are normal. Pupils are equal, round, and reactive to light. Right eye exhibits no discharge. Left eye exhibits no discharge. No scleral icterus.  Neck: Normal range of motion. Neck supple.  Cardiovascular: Normal rate, regular rhythm and normal heart sounds.  Exam reveals no gallop and no friction rub.   No murmur heard. Pulmonary/Chest: Effort normal and breath sounds normal. No stridor. No respiratory distress. She has no wheezes.  Abdominal: Soft. She exhibits no distension. There is no tenderness.  Musculoskeletal: She exhibits no edema or tenderness.  Neurological: She is alert and oriented to person, place, and time.  Skin: Skin is warm and dry. No rash noted. She is not diaphoretic. No erythema.  Psychiatric: She has a normal mood and affect.    ED Course  Procedures (including critical care time) Labs Review Labs Reviewed  CBC - Abnormal; Notable for the following:    RDW 17.2 (*)    All other components within normal limits  COMPREHENSIVE METABOLIC PANEL - Abnormal; Notable for the following:    Sodium 133 (*)    Chloride 100 (*)    Glucose, Bld 158 (*)    Creatinine, Ser 1.04 (*)    All other  components within normal limits  CBG MONITORING, ED - Abnormal; Notable for the following:    Glucose-Capillary 171 (*)    All other components within normal limits  DIFFERENTIAL  APTT  PROTIME-INR  I-STAT TROPOININ, ED    Imaging Review No results found.   EKG Interpretation None      MDM   49 year old female here for 2 weeks of fatigue. She recently went on to begin diet and has had the symptoms since. Patient was evaluated 8 days ago for the same. No improvement in her symptoms. She endorses intermittent headache. No focal deficits. Exam nonfocal. Headache improved with IV fluids along. Likely tension headache. Low concern for subarachnoid, ICH, pseudotumor. Patient is currently chest pain-free. EKG with normal sinus rhythm, intervals, and axis. No evidence of acute ischemia, arrhythmia, or blocks. Troponin negative. Labs nonspecific.  After 500 mL of IV fluids the patient states that she is feeling better. She is in good condition is stable for discharge with strict return precautions. Patient to follow up with PCP as needed.  Seen in conjunction with Dr. Ashok Cordia.  Final diagnoses:  Other fatigue  Episodic tension-type headache, not intractable        Addison Lank, MD 10/10/14 2129  Lajean Saver, MD 10/10/14 2350

## 2014-10-10 NOTE — ED Notes (Signed)
Pt c/o shaking all over, headache, dizziness, and generalized weakness x 1 week.  States she was already seen in ED for same but symptoms have gotten worse.  States, "the only thing that has improved is my mental function is better."  Pt states she just found out she has not been eating enough protein in her diet.

## 2014-10-10 NOTE — Discharge Instructions (Signed)
Cardiac Diet  A cardiac diet can help stop heart disease or a stroke from happening. It involves eating less unhealthy fats and eating more healthy fats.   FOODS TO AVOID OR LIMIT  · Limit saturated fats. This type of fat is found in oils and dairy products, such as:  ¨ Coconut oil.  ¨ Palm oil.  ¨ Cocoa butter.  ¨ Butter.  · Avoid trans-fat or hydrogenated oils. These are found in fried or pre-made baked goods, such as:  ¨ Margarine.  ¨ Pre-made cookies, cakes, and crackers.  · Limit processed meats (hot dogs, deli meats, sausage) to 3 ounces a week.  · Limit high-fat meats (marbled meats, fried chicken, or chicken with skin) to 3 ounces a week.  · Limit salt (sodium) to 1500 milligrams a day.  ·  Limit sweets and drinks with added sugar to no more than 5 servings a week. One serving is:  ¨ 1 tablespoon of sugar.  ¨ 1 tablespoon of jelly or jam.  ¨ ½ cup sorbet.  ¨ 1 cup lemonade.  ¨ ½ cup regular soda.  EAT MORE OF THE FOLLOWING FOODS  Fruit  · Eat 4 to 5 servings a day. One serving of fruit is:  ¨ 1 medium whole fruit.  ¨ ¼ cup dried fruit.  ¨ ½ cup of fresh, frozen, or canned fruit.  ¨ ½ cup 100% fruit juice.  Vegetables  · Eat 4 to 5 servings a day. One serving is:  ¨ 1 cup raw leafy vegetables.  ¨ ½ cup raw or cooked, cut-up vegetables.  ¨ ½ cup vegetable juice.  Whole Grains  · Eat 3 servings a day (1 ounce equals 1 serving).  Legumes (such as beans, peas, and lentils)   · Eat at least 4 servings a week (½ cup equals 1 serving).  Nuts and Seeds   · Eat at least 4 servings a week (¼ cup equals 1 serving).  Dietary Fiber  · Eat 20 to 30 grams a day. Some foods high in dietary fiber include:  ¨ Dried beans.  ¨ Citrus fruits.  ¨ Apples, bananas.  ¨ Broccoli, Brussels sprouts, and eggplant.  ¨ Oats.  Omega-3 Fats  · Eat food with omega-3 fats. You can also take a dietary pill (supplement) that has 1 gram of DHA and EPA. Have 3.5 ounces of fatty fish a week, such as:  ¨ Salmon.  ¨ Mackerel.  ¨ Albacore  tuna.  ¨ Sardines.  ¨ Lake trout.  ¨ Herring.  PREPARING YOUR FOOD  · Broil, bake, steam, or roast foods. Do not fry food. Do not cook food in butter (fat).  · Use non-stick cooking sprays.  · Remove skin from poultry, such as chicken and turkey.  · Remove fat from meat.  · Take the fat off the top of stews, soups, and gravy.  · Use lemon or herbs to flavor food instead of using butter or margarine.  · Use nonfat yogurt, salsa, or low-fat dressings for salads.  Document Released: 10/02/2011 Document Reviewed: 10/02/2011  Exit[employer]® Patient Information ©2015 [employer]. This information is not intended to replace advice given to you by your health care provider. Make sure you discuss any questions you have with your health care provider.

## 2014-10-12 ENCOUNTER — Encounter: Payer: Self-pay | Admitting: Dietician

## 2014-10-12 DIAGNOSIS — F319 Bipolar disorder, unspecified: Secondary | ICD-10-CM | POA: Diagnosis not present

## 2014-10-12 NOTE — Progress Notes (Signed)
  Medical Nutrition Therapy:  Appt start time: 3875 end time:  1630.  Assessment:  Primary concerns today:wants menus to assist her with making sure she is eating adequately. Reports feeling tired lately and has lost 10.%# in 2 weeks. This is a considerable amount of weight. Menus were completed today encouraging 3 meals with protein and starch, fruit and vegetables and snacks suggested if needed  Has questions about portion sizes and needs review on carbohydrate containing foods.  Preferred Learning Style: No preference indicated  Learning Readiness: In progress of making changes MEDICATIONS: LANTUS 18 units a day, glucophage 1000 mg BID  BLOOD SUGARS: Did not bring meter, reports blood sugar are okay 100s and 200s.    DIETARY INTAKE: Usual eating pattern includes 2-3 meals. 24-hr recall:  B ( AM): granola or oatmeal and soymilk   L ( PM): 1/2 tuna sandwich  Snk ( PM): banana D ( PM):  Chicken wings x2, 1.2 sweet potato greens Snk ( PM): water, soda  Usual physical activity: not discussed today, limited recently due to a torn meniscus  Estimated energy needs for weight loss ~1500-1700 calories/day ~ 180 g carbohydrates 60-80 g protein   Progress Towards Goal(s):  Some progress.   Nutritional Diagnosis:  NB-1.1 Food and nutrition-related knowledge deficit As related to lack of prior sufficient nutrition information especially on diabetes  meal plannig and vegetarian nutrition is improving  As evidenced by her report and lack of knnowlege of what foods contain carbs and what is a serving her report and questions.    Intervention:  Nutrition education on carb consistency and transitioning to vegetarian diet using menus to show her the amounts recommended. . Teaching Method Utilized: Visual,,Auditory,Hands on Handouts given during visit include:AVS, menus Barriers to learning/adherence to lifestyle change: competing values, support  Demonstrated degree of understanding via:  Teach  Back   Monitoring/Evaluation:  Dietary intake, exercise, meter, and body weight in 3 week(s).

## 2014-10-19 ENCOUNTER — Other Ambulatory Visit: Payer: Self-pay | Admitting: *Deleted

## 2014-10-19 DIAGNOSIS — I10 Essential (primary) hypertension: Secondary | ICD-10-CM

## 2014-10-20 MED ORDER — METOPROLOL TARTRATE 100 MG PO TABS: 100.0000 mg | ORAL_TABLET | Freq: Two times a day (BID) | ORAL | Status: DC

## 2014-10-20 MED ORDER — LISINOPRIL 40 MG PO TABS: 40.0000 mg | ORAL_TABLET | Freq: Every day | ORAL | Status: DC

## 2014-10-20 NOTE — Telephone Encounter (Signed)
Patient calling about refill. States she has no medication to take for tomorrow. i advised about 48 hr policy.

## 2014-10-22 ENCOUNTER — Encounter: Payer: Self-pay | Admitting: Internal Medicine

## 2014-10-22 ENCOUNTER — Encounter: Payer: Self-pay | Admitting: Dietician

## 2014-10-22 ENCOUNTER — Ambulatory Visit (INDEPENDENT_AMBULATORY_CARE_PROVIDER_SITE_OTHER): Payer: Medicare Other | Admitting: Dietician

## 2014-10-22 ENCOUNTER — Ambulatory Visit (INDEPENDENT_AMBULATORY_CARE_PROVIDER_SITE_OTHER): Payer: Medicare Other | Admitting: Internal Medicine

## 2014-10-22 VITALS — Wt 199.3 lb

## 2014-10-22 VITALS — BP 115/62 | HR 82 | Temp 98.5°F | Ht 64.0 in | Wt 199.3 lb

## 2014-10-22 DIAGNOSIS — E119 Type 2 diabetes mellitus without complications: Secondary | ICD-10-CM | POA: Diagnosis not present

## 2014-10-22 DIAGNOSIS — Z7189 Other specified counseling: Secondary | ICD-10-CM | POA: Diagnosis present

## 2014-10-22 DIAGNOSIS — Z794 Long term (current) use of insulin: Secondary | ICD-10-CM

## 2014-10-22 DIAGNOSIS — Z79899 Other long term (current) drug therapy: Secondary | ICD-10-CM

## 2014-10-22 DIAGNOSIS — Z713 Dietary counseling and surveillance: Secondary | ICD-10-CM

## 2014-10-22 DIAGNOSIS — F17200 Nicotine dependence, unspecified, uncomplicated: Secondary | ICD-10-CM

## 2014-10-22 DIAGNOSIS — Z719 Counseling, unspecified: Secondary | ICD-10-CM

## 2014-10-22 LAB — GLUCOSE, CAPILLARY: GLUCOSE-CAPILLARY: 135 mg/dL — AB (ref 65–99)

## 2014-10-22 NOTE — Patient Instructions (Signed)
1. If you are going to fast, please take half of your insulin the night before the fast (9 units) then skip metformin the day of the fast. The next night when you eat you can take your usual dose of insulin.   2. Please take all medications as prescribed.    3. If you have worsening of your symptoms or new symptoms arise, please call the clinic ([phone]), or go to the ER immediately if symptoms are severe.

## 2014-10-22 NOTE — Patient Instructions (Signed)
You are doing a great job limiting your portions and eating healthy.  We can tell because your weight is still decreasing gradually and your blood sugars are close to target ranges.   Consider walking or moving for 5 minutes after meals to keep your blood sugar from going up throughout the day.

## 2014-10-22 NOTE — Progress Notes (Signed)
  Medical Nutrition Therapy:  Appt start time: 3559 end time:  7416. 3rd MNT visit. Assessment:  Primary concerns today:wants to eat healthy, thinking about trying to fast, but does not want to do strict vegetarian diet anymore. Wants to contunie to try to decrease her weight while maintaining good blood sugar control.  Reports healthier intake with smaller portions. Her weight is stable after her 10# loss. Knows foods that increase her blood sugars-" limit fruit and starches and eat all the vegetables I want"  Has .  Discussed future goalsPreferred Learning Style: No preference indicated  Learning Readiness: In progress of making changes MEDICATIONS: LANTUS 18 units a day, glucophage 1000 mg BID  BLOOD SUGARS: Pattern is fasting 148 then increases as day goes on, average is 160s, a1C likely in 7s    DIETARY INTAKE: Usual eating pattern includes 2-3 meals. 24-hr recall:  B ( AM): Boost and yogurt   L ( PM): 1/2 sandwich  Snk ( PM): banana with peanut butter D ( PM):  Eats out - fried fish, cole slaw, hush puppies, sweet potato- no butter now Snk ( PM): water, soda  Usual physical activity: limited- says she is not ready, asked if she could walk 5 minutes after meals to keep blood sugars from rising  Estimated energy needs for weight loss ~1500-1700 calories/day ~ 180 g carbohydrates 60-80 g protein   Progress Towards Goal(s):  Some progress.   Nutritional Diagnosis:  NB-1.1 Food and nutrition-related knowledge deficit As related to lack of prior sufficient nutrition information especially on diabetes  meal plannig and vegetarian nutrition is improving  As evidenced by her report and  knowlege of what foods contain carbs and what is a serving her report and questions.    Intervention:  Nutrition education on carb consistency and transitioning to vegetarian diet using menus to show her the amounts recommended. . Teaching Method Utilized: Visual,,Auditory,Hands on Handouts given during  visit include:AVS, menus Barriers to learning/adherence to lifestyle change: competing values, support  Demonstrated degree of understanding via:  Teach Back   Monitoring/Evaluation:  Dietary intake, exercise, meter, and body weight in 4 week(s).

## 2014-10-22 NOTE — Progress Notes (Signed)
Subjective:    Patient ID: [name], female    DOB: 25-Aug-1965, 49 y.o.   MRN: [id number]  HPI Comments: [name] is a 49 year old woman with PMH as below here to discuss diagnosis in chart that she was not aware of.  Please see problem based charting for assessment and plan.     Past Medical History  Diagnosis Date  . Arteriosclerotic cardiovascular disease (ASCVD)     Minimal at cath in [facility].stress nuclear study in 8/08 with nl EF; neg stress echo in 2010  . Diabetes mellitus, type 2 2000    Onset in 2000; no insulin  . Hyperlipidemia   . Hypertension `    during treatment with Geodon  . Gastroesophageal reflux disease     Schatzki's ring  . Anemia, iron deficiency   . Alcohol abuse   . Depression   . Community acquired pneumonia 01/03/10, 05/2010, 04/2012    2011; with pleural effusion-[facility] acute resp failure; intubated in Jan 2014 (HMPV pneumonia)  . Obesity   . Schizoaffective disorder     requiring multiple psychiatric admissions  . Dysphagia   . Diastolic dysfunction     grade 2 per echo 2011  . Pulmonary hypertension 05/02/2012    Patient needs repeat echo in 06/2012   . History of alcohol abuse 07/22/2007    Qualifier: Diagnosis of  By: Lenn Cal    . PTSD (post-traumatic stress disorder)    Current Outpatient Prescriptions on File Prior to Visit  Medication Sig Dispense Refill  . ACCU-CHEK FASTCLIX LANCETS MISC Use to check blood sugar 3 to 4 times daily. diag code E11.9. Insulin dependent 204 each 11  . acetaminophen (TYLENOL) 500 MG tablet Take 1,000 mg by mouth every 6 (six) hours as needed for mild pain.    Marland Kitchen albuterol (PROVENTIL HFA;VENTOLIN HFA) 108 (90 BASE) MCG/ACT inhaler Inhale 2 puffs into the lungs 3 (three) times daily as needed for wheezing or shortness of breath. 1 Inhaler 2  . albuterol (PROVENTIL) (2.5 MG/3ML) 0.083% nebulizer solution Take 3 mLs (2.5 mg total) by nebulization every 6 (six) hours as needed for  wheezing or shortness of breath. 75 mL 12  . benzonatate (TESSALON PERLES) 100 MG capsule Take 1 capsule (100 mg total) by mouth every 6 (six) hours as needed for cough. (Patient not taking: Reported on 09/15/2014) 30 capsule 1  . esomeprazole (NEXIUM) 40 MG capsule TAKE ONE CAPSULE BY MOUTH TWICE DAILY BEFORE A MEAL. 60 capsule 0  . ferrous sulfate 325 (65 FE) MG tablet Take 1 tablet (325 mg total) by mouth 3 (three) times daily with meals. (Patient not taking: Reported on 09/15/2014) 90 tablet 3  . furosemide (LASIX) 20 MG tablet Take 1 tablet (20 mg total) by mouth daily. 30 tablet 6  . glucose blood (ACCU-CHEK SMARTVIEW) test strip Use to check blood sugar 3 to 4 times daily. diag code E11.9. Insulin dependent. 120 each 11  . guaiFENesin-codeine (ROBITUSSIN AC) 100-10 MG/5ML syrup Take 5 mLs by mouth 3 (three) times daily as needed for cough. (Patient not taking: Reported on 09/15/2014) 120 mL 0  . hydrochlorothiazide (HYDRODIURIL) 12.5 MG tablet Take 12.5 mg by mouth daily.    Marland Kitchen HYDROcodone-acetaminophen (NORCO/VICODIN) 5-325 MG per tablet Take by mouth.    . hydrOXYzine (ATARAX/VISTARIL) 10 MG tablet Take 10 mg by mouth daily.    Marland Kitchen ibuprofen (ADVIL,MOTRIN) 200 MG tablet Take 400 mg by mouth every 6 (six) hours as  needed for mild pain.    . Insulin Glargine (LANTUS SOLOSTAR) 100 UNIT/ML Solostar Pen Inject 18 Units into the skin at bedtime. 15 mL 1  . lisinopril (PRINIVIL,ZESTRIL) 40 MG tablet Take 1 tablet (40 mg total) by mouth daily. 90 tablet 1  . loperamide (IMODIUM A-D) 2 MG tablet Take 1 tablet (2 mg total) by mouth 4 (four) times daily as needed for diarrhea or loose stools. 30 tablet 0  . loratadine (CLARITIN) 10 MG tablet Take 1 tablet (10 mg total) by mouth daily. 30 tablet 3  . lovastatin (MEVACOR) 20 MG tablet Take 1 tablet (20 mg total) by mouth at bedtime. 90 tablet 3  . meclizine (ANTIVERT) 50 MG tablet Take 1 tablet (50 mg total) by mouth 3 (three) times daily as needed for dizziness.  (Patient not taking: Reported on 09/15/2014) 30 tablet 0  . meloxicam (MOBIC) 15 MG tablet Take 1 tablet (15 mg total) by mouth daily. For 5 days (Patient not taking: Reported on 09/15/2014) 30 tablet 0  . metFORMIN (GLUCOPHAGE) 1000 MG tablet TAKE (1) TABLET BY MOUTH TWICE DAILY WITH MEALS. 60 tablet 6  . metoprolol (LOPRESSOR) 100 MG tablet Take 1 tablet (100 mg total) by mouth 2 (two) times daily. 180 tablet 1  . mometasone (NASONEX) 50 MCG/ACT nasal spray Place 2 sprays into the nose 2 (two) times daily. 17 g 12  . NOVOFINE 32G X 6 MM MISC     . ofloxacin (OCUFLOX) 0.3 % ophthalmic solution     . ondansetron (ZOFRAN ODT) 4 MG disintegrating tablet 4mg  ODT q4 hours prn nausea/vomit (Patient not taking: Reported on 09/15/2014) 12 tablet 0  . potassium chloride SA (K-DUR,KLOR-CON) 20 MEQ tablet Take 1 tablet (20 mEq total) by mouth 2 (two) times daily. 60 tablet 5  . saccharomyces boulardii (FLORASTOR) 250 MG capsule Take 1 capsule (250 mg total) by mouth 2 (two) times daily. (Patient not taking: Reported on 09/15/2014) 60 capsule 1  . topiramate (TOPAMAX) 50 MG tablet Take 1 tablet (50 mg total) by mouth 2 (two) times daily. 60 tablet 6  . ziprasidone (GEODON) 80 MG capsule Take 1 capsule (80 mg total) by mouth 2 (two) times daily with a meal. (Patient taking differently: Take 160 mg by mouth at bedtime. ) 60 capsule 0   Current Facility-Administered Medications on File Prior to Visit  Medication Dose Route Frequency Provider Last Rate Last Dose  . 0.9 %  sodium chloride infusion   Intravenous Once Merryl Hacker, MD      . ondansetron [facility]) injection 4 mg  4 mg Intramuscular Once Merryl Hacker, MD      . pantoprazole (PROTONIX) injection 40 mg  40 mg Intravenous Once Merryl Hacker, MD        Review of Systems  Constitutional: Negative for appetite change.  Respiratory: Negative for shortness of breath.   Cardiovascular: Negative for chest pain.  Gastrointestinal: Negative for nausea  and vomiting.       Her nausea and vomiting have resolved.  Neurological: Negative for headaches.  Psychiatric/Behavioral: Negative for suicidal ideas and dysphoric mood.       Filed Vitals:   10/22/14 1515  BP: 115/62  Pulse: 82  Temp: 98.5 F (36.9 C)  TempSrc: Oral  Height: 5\' 4"  (1.626 m)  Weight: 199 lb 4.8 oz (90.402 kg)  SpO2: 100%     Objective:   Physical Exam  Constitutional: She is oriented to person, place, and time. She appears  well-developed. No distress.  HENT:  Head: Normocephalic.  Mouth/Throat: Oropharynx is clear and moist. No oropharyngeal exudate.  Neck: Neck supple.  Cardiovascular: Normal rate and regular rhythm.  Exam reveals no gallop and no friction rub.   No murmur heard. Pulmonary/Chest: Effort normal and breath sounds normal. No respiratory distress. She has no wheezes. She has no rales. She exhibits no tenderness.  Abdominal: Soft. Bowel sounds are normal. She exhibits no distension. There is no tenderness. There is no rebound.  Musculoskeletal: Normal range of motion.  Neurological: She is alert and oriented to person, place, and time. No cranial nerve deficit.  Skin: Skin is warm. She is not diaphoretic.  Psychiatric: She has a normal mood and affect. Her behavior is normal. Judgment and thought content normal.  Vitals reviewed.         Assessment & Plan:  Please see problem based charting for assessment and plan.

## 2014-10-24 DIAGNOSIS — Z719 Counseling, unspecified: Secondary | ICD-10-CM | POA: Insufficient documentation

## 2014-10-24 NOTE — Assessment & Plan Note (Addendum)
She would like to begin fasting for religious purposes.  She plans to fast for one day at a time and not several days in a row.  She would like to know how to take her DM medications on days that she is fasting.   - I advised she take half of her Lantus (9 units) the evening prior to the fast, skip her metformin on the day of the fast, resume full dose Lantus after meal (HS) on the evening she begins to eat again.

## 2014-10-24 NOTE — Assessment & Plan Note (Addendum)
The patient left message with [facility] two weeks ago upset because she was informed schizoaffective disorder was in her hx when she was seen in the ED.  This diagnosis is listed in the history but current diagnosis of Bipolar disorder is on active problem list.  Review of EPIC revealed this dx during 2012 [facility] admission.  There are previous [facility] admission dating back to 2000 (pre-EPIC) and I do not have access to the documents associated with the admissions. Today's visit was made in order to discuss this face to face with the patient.  She was pleasant during the visit and said she was no longer upset about this.  I explained that mental health diagnosis like physical diagnosis may change over time if symptoms change and new information is obtained.  I explained that at the time she was diagnosed she likely fit criteria for that disorder but subsequent evaluations may have been more c/w bipolar.  I explained that I could not remove past provider's dx from the hx because that is what the assessment was at the time.  She was happy with the above explanation.  She is compliant with medications and therapy and feels her mood is stable.   She denies SI.  Will attempt to obtain record from [facility] so all dx are appropriately documented in EPIC.  Patient says she also was not aware of COPD dx so I reviewed note from Pulm visit indicating mild COPD and re-iterated importance of smoking cessation.  She was satisfied with the visit.  She will return for routine follow-up in two months.

## 2014-10-25 NOTE — Progress Notes (Signed)
Medicine attending: Medical history, presenting problems, physical findings, and medications, reviewed with Dr Alex Wilson on the day of the patient visit   and I concur with her evaluation and management plan. 

## 2014-10-26 ENCOUNTER — Telehealth: Payer: Self-pay | Admitting: *Deleted

## 2014-10-26 NOTE — Telephone Encounter (Signed)
Dr. Jacqualyn Posey states he would like pt to come in to evaluate the status of her ulcer and discuss the dopplers results.  Left message to set up an appt.

## 2014-10-30 ENCOUNTER — Other Ambulatory Visit: Payer: Self-pay | Admitting: Internal Medicine

## 2014-11-01 ENCOUNTER — Other Ambulatory Visit: Payer: Self-pay | Admitting: Internal Medicine

## 2014-11-02 ENCOUNTER — Telehealth: Payer: Self-pay | Admitting: *Deleted

## 2014-11-02 NOTE — Telephone Encounter (Signed)
Needs refills on nicotine patch 21mg  and 14mg  patch. Uses [facility]. Hilda Blades Dtzler RN 11/02/14 330PM

## 2014-11-02 NOTE — Telephone Encounter (Signed)
It looks like this was initially prescribed last year.  I have no problem refilling but what dose is she currently using (or how much is she smoking)?

## 2014-11-03 ENCOUNTER — Encounter: Payer: Self-pay | Admitting: Podiatry

## 2014-11-03 ENCOUNTER — Ambulatory Visit (INDEPENDENT_AMBULATORY_CARE_PROVIDER_SITE_OTHER): Payer: Medicare Other | Admitting: Podiatry

## 2014-11-03 VITALS — BP 111/65 | HR 84 | Resp 16

## 2014-11-03 DIAGNOSIS — L853 Xerosis cutis: Secondary | ICD-10-CM

## 2014-11-03 DIAGNOSIS — M2042 Other hammer toe(s) (acquired), left foot: Secondary | ICD-10-CM

## 2014-11-03 DIAGNOSIS — L84 Corns and callosities: Secondary | ICD-10-CM

## 2014-11-03 NOTE — Telephone Encounter (Signed)
Talked with pt per Dr Redmond Pulling - no smoking in 7 days. Started back on nicotene patch 7 days ago on 21mg . Still has cravings for smoking. Pt would like to continue with 21mg  a little longer before dropping to 14mg . Pharmacy is unable to fill Rx due to lapse time. Hilda Blades Ishmail Mcmanamon RN 11/03/14 1:25PM

## 2014-11-03 NOTE — Telephone Encounter (Signed)
Rx for 21mg  patch #28 no refills sent to pharmacy.

## 2014-11-05 NOTE — Progress Notes (Signed)
Patient ID: [name], female   DOB: [birth year], 49 y.o.   MRN: [id number]  Subjective: 45 -year-old female presents the office they for follow-up evaluation of ulcerations or ankles as also discuss vascular results. She says overall she is doing well. She has no sick corn on the top for left second toe which causes irritation shoe gear. She denies any redness or drainage from the area. She says dry skin reveals. No other complaints at this time.  Objective: AAO 3, NAD DP/PT pulses palpable, CRT less than 3 seconds Protective sensation intact with Simms was monofilament Previous areas of ulceration on the lateral aspect of the ankles have healed at this time. There is no evidence ulceration present bilateral lower extremity's. On the dorsal aspect of left second digit there is a hyperkeratotic lesion due to underlying hammertoe contracture. Upon debridement no underlying ulceration, drainage or other clinical signs of infection. There is no overlying edema, erythema, increase in warmth to bilateral lower extremities. There is dry skin to her heel. There is no significant hyperkeratotic lesions to the heels. No other complaints at this time. No pain with calf compression, swelling, warmth, erythema.  Assessment: 49 year old female hyperkeratotic lesion left second toe due to hammertoe; healed ankle ulcerations; dry skin  Plan: -Treatment options discussed including all alternatives, risks, and complications -Discussed vascular results. Likely microvascular disease. -Hyperkeratotic lesion left second toe sharply debrided without, occasions/bleeding. Dispensed offloading pads. -Moisturizer to the heels daily -Follow-up as needed or sooner if any problems arise. In the meantime, encouraged to call the office with any questions, concerns, change in symptoms.   Celesta Gentile, DPM

## 2014-11-11 ENCOUNTER — Ambulatory Visit: Payer: Medicare Other | Admitting: Neurology

## 2014-11-25 ENCOUNTER — Ambulatory Visit (INDEPENDENT_AMBULATORY_CARE_PROVIDER_SITE_OTHER): Payer: Medicare Other | Admitting: Neurology

## 2014-11-25 ENCOUNTER — Ambulatory Visit: Payer: Medicare Other | Admitting: Dietician

## 2014-11-25 VITALS — BP 143/70 | HR 78 | Ht 64.0 in | Wt 212.0 lb

## 2014-11-25 DIAGNOSIS — G932 Benign intracranial hypertension: Secondary | ICD-10-CM

## 2014-11-25 DIAGNOSIS — R519 Headache, unspecified: Secondary | ICD-10-CM

## 2014-11-25 DIAGNOSIS — R51 Headache: Secondary | ICD-10-CM | POA: Diagnosis not present

## 2014-11-25 DIAGNOSIS — H538 Other visual disturbances: Secondary | ICD-10-CM

## 2014-11-25 MED ORDER — TOPIRAMATE 50 MG PO TABS: 50.0000 mg | ORAL_TABLET | Freq: Two times a day (BID) | ORAL | Status: DC

## 2014-11-25 NOTE — Patient Instructions (Signed)
Overall you are doing fairly well but I do want to suggest a few things today:   Remember to drink plenty of fluid, eat healthy meals and do not skip any meals. Try to eat protein with a every meal and eat a healthy snack such as fruit or nuts in between meals. Try to keep a regular sleep-wake schedule and try to exercise daily, particularly in the form of walking, 20-30 minutes a day, if you can.   As far as your medications are concerned, I would like to suggest: restart topamax  As far as diagnostic testing: Lumbar puncture  I would like to see you back in 3 months, sooner if we need to. Please call us with any interim questions, concerns, problems, updates or refill requests.   Please also call us for any test results so we can go over those with you on the phone.  My clinical assistant and will answer any of your questions and relay your messages to me and also relay most of my messages to you.   Our phone number is [phone]. We also have an after hours call service for urgent matters and there is a physician on-call for urgent questions. For any emergencies you know to call 911 or go to the nearest emergency room

## 2014-11-25 NOTE — Progress Notes (Signed)
[facility]    Provider:  Dr Jaynee Eagles Referring Provider: Francesca Oman, DO Primary Care Physician:  Duwaine Maxin, DO  CC:  headache  HPI:  [name] is a 49 y.o. female here as a referral from Dr. Redmond Pulling for headache  Interval update 11/25/2014: She stopped the Topamax. The Topamax was helping with the headaches but went off of it a month ago. She has episodes of blurry vision. She feels dizzy. She has had a headache for 3 days straight. Before this she was starting to have headaches a few days a week. The headache feels like pressure on top of the forehead. She is having headache and nausea. No neck stiffness. No hearing changes. Partially empty sella on MRi otherwise no signs of IIH in MRi (no flattening of the globes or increased fluid within the optic nerve sheaths). Sleep study in May did not show any significant obstructive sleep apnea.  Reviewed notes, labs and imaging from outside physicians, which showed: CBC unremarkable, CMP with slightly elevated creatinine at 1.04 and slightly decreased sodium at 133.  MRi brain 08/26/2014: The brain parenchyma shows no abnormal signal intensities. No structural lesion, tumor infarcts are noted. The subarachnoid spaces and ventricular systems appear normal. Calvarium shows no abnormalities. Orbits appear unremarkable. Paranasal sinuses show mild chronic mucosal thickening. The pituitary gland is slightly atrophied with minimally enlarged sella. The visualized portion of the upper cervical spine appear unremarkable. Flow-voids of the large vessels of the intracranial circulation appear to be patent. Postcontrast images do not result in any abnormal areas of enhancement. The eighth nerve complex and inner ear structures appear normal.  IMPRESSION: Unremarkable MRI scan of the brain with and without contrast  HPI 08/12/2014: [name] is a 49 y.o. female here as a referral from Dr. Erik Obey for headache. Past medical  history hypertension, diabetes, migraine, depression, anxiety, high cholesterol.  Headaches started 3-4 months ago.Worsening. Used to have them as a child but they went away. Pain and pressure, behind the nose. Thought it was a sinus infection an CT was negative. Worsening. Every day. Tyelnol/advil every day, not helping. Worse at night. With vomiting. Wakes up with them every day. Pressure. Vision is blurry at times. Severe pressure all around the head. She hears her heart beat in her ears. No recent excessive weight gain. Smokes and is coughing today in the office. Wearing a patch, quitting smoking. Worsening memory. She falls asleep at a drop. She has been told she has OSA in the past but never did the test. If sitting on the couch she will fall asleep.   Reviewed notes, labs and imaging from outside physicians, which showed: Notes from [facility] show that patient was evaluated in March 2016 about a sinus problem. Patient was given several rounds of antibiotics, the maxillofacial CT did not show sinusitis. She uses Nasonex. She also reported chronic headaches, daily. Putting daily headaches for the past 4 years, localized over the forehead and in between the eyes. Throbbing. Worse outdoors. Worse in the spring and fall. Maxillofacial CT scan on 07/29/2014 showed no evidence of sinusitis, very minimal mucosal thickening present in the maxillary sinuses and the right partition of the sphenoid sinus.   CT head 2015 showed No acute intracranial abnormalities including mass lesion or mass effect, hydrocephalus, extra-axial fluid collection, midline shift, hemorrhage, or acute infarction, large ischemic events (personally reviewed images)  Review of Systems: Patient complains of symptoms per HPI as well as the following symptoms: Appetite change,  chills, fever, cough, shortness of breath, chest tightness, facial swelling, headache, itching. Pertinent negatives per HPI. All others negative.   Social  History   Social History  . Marital Status: Single    Spouse Name: N/A  . Number of Children: 2  . Years of Education: some colge   Occupational History  . Disability   . UNEMPLOYED    Social History Main Topics  . Smoking status: Current Every Day Smoker -- 1.00 packs/day for 30 years    Types: Cigarettes    Start date: 05/18/2012  . Smokeless tobacco: Never Used     Comment: cutting back,  . Alcohol Use: No     Comment: hx of ETOH abuse  . Drug Use: No  . Sexual Activity: Not on file   Other Topics Concern  . 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 Colon cancer Other   . Hypertension Mother   . Stroke Father     deceased at age 23  . Heart disease Sister   . Anesthesia problems Neg Hx   . Hypotension Neg Hx   . Malignant hyperthermia Neg Hx   . Pseudochol deficiency Neg Hx   . Diabetes    . High Cholesterol    . Arthritis      Past Medical History  Diagnosis Date  . Arteriosclerotic cardiovascular disease (ASCVD)     Minimal at cath in [facility].stress nuclear study in 8/08 with nl EF; neg stress echo in 2010  . Diabetes mellitus, type 2 2000    Onset in 2000; no insulin  . Hyperlipidemia   . Hypertension `    during treatment with Geodon  . Gastroesophageal reflux disease     Schatzki's ring  . Anemia, iron deficiency   . Alcohol abuse   . Depression   . Community acquired pneumonia 01/03/10, 05/2010, 04/2012    2011; with pleural effusion-[facility] acute resp failure; intubated in Jan 2014 (HMPV pneumonia)  . Obesity   . Schizoaffective disorder     requiring multiple psychiatric admissions  . Dysphagia   . Diastolic  dysfunction     grade 2 per echo 2011  . Pulmonary hypertension 05/02/2012    Patient needs repeat echo in 06/2012   . History of alcohol abuse 07/22/2007    Qualifier: Diagnosis of  By: Lenn Cal    . PTSD (post-traumatic stress disorder)     Past Surgical History  Procedure Laterality Date  . Dilation and curettage, diagnostic / therapeutic  1992  . Esophagogastroduodenoscopy  09/16/08    Dr. Trevor Iha hiatal hernia/excoriations involving the cardia and mucosa consistent with trauma, antral erosions  of linear petechiae ? gastritis versus early gastric antral vascular  ectasia.Marland Kitchen biopsy showed reactive gastropathy. No H. pylori.  . Esophagogastroduodenoscopy  09/2007    Dr. Evalee Mutton ring, dilated to 83 French Maloney dilator, small hiatal hernia, antral erosions, biopsies reactive gastropathy.  Azzie Almas dilation  07/17/2011    [facility] sedation-->distal esophageal stricture s/p dilation, chronic gastritis, multiple ulcers in stomach. no h.pylori  . Colonoscopy  01/2006    internal hemorrhoids  . Colonoscopy  01/10/2012  Dr. Rourk:Single anal canal hemorrhoidal tag likely source of  trivial hematochezia; right-sided colonic diverticulosis  . Esophagogastroduodenoscopy (egd) with propofol N/A 12/17/2013    OEU:MPNTIRW antral erosions and petechiae. Small hiatal hernia. No endoscopic explanation for patient's symptoms    Current Outpatient Prescriptions  Medication Sig Dispense Refill  . ACCU-CHEK FASTCLIX LANCETS MISC Use to check blood sugar 3 to 4 times daily. diag code E11.9. Insulin dependent 204 each 11  . acetaminophen (TYLENOL) 500 MG tablet Take 1,000 mg by mouth every 6 (six) hours as needed for mild pain.    Marland Kitchen albuterol (PROVENTIL HFA;VENTOLIN HFA) 108 (90 BASE) MCG/ACT inhaler Inhale 2 puffs into the lungs 3 (three) times daily as needed for wheezing or shortness of breath. 1 Inhaler 2  . albuterol (PROVENTIL) (2.5 MG/3ML) 0.083% nebulizer solution Take 3 mLs (2.5 mg  total) by nebulization every 6 (six) hours as needed for wheezing or shortness of breath. 75 mL 12  . esomeprazole (NEXIUM) 40 MG capsule TAKE ONE CAPSULE BY MOUTH TWICE DAILY BEFORE A MEAL. 60 capsule 0  . furosemide (LASIX) 20 MG tablet Take 1 tablet (20 mg total) by mouth daily. 30 tablet 6  . glucose blood (ACCU-CHEK SMARTVIEW) test strip Use to check blood sugar 3 to 4 times daily. diag code E11.9. Insulin dependent. 120 each 11  . hydrochlorothiazide (HYDRODIURIL) 12.5 MG tablet Take 12.5 mg by mouth daily.    Marland Kitchen HYDROcodone-acetaminophen (NORCO/VICODIN) 5-325 MG per tablet Take by mouth.    . hydrOXYzine (ATARAX/VISTARIL) 10 MG tablet Take 10 mg by mouth daily.    Marland Kitchen ibuprofen (ADVIL,MOTRIN) 200 MG tablet Take 400 mg by mouth every 6 (six) hours as needed for mild pain.    . Insulin Glargine (LANTUS SOLOSTAR) 100 UNIT/ML Solostar Pen Inject 18 Units into the skin at bedtime. 15 mL 1  . lisinopril (PRINIVIL,ZESTRIL) 40 MG tablet Take 1 tablet (40 mg total) by mouth daily. 90 tablet 1  . loperamide (IMODIUM A-D) 2 MG tablet Take 1 tablet (2 mg total) by mouth 4 (four) times daily as needed for diarrhea or loose stools. 30 tablet 0  . loratadine (CLARITIN) 10 MG tablet Take 1 tablet (10 mg total) by mouth daily. 30 tablet 3  . lovastatin (MEVACOR) 20 MG tablet Take 1 tablet (20 mg total) by mouth at bedtime. 90 tablet 3  . metFORMIN (GLUCOPHAGE) 1000 MG tablet TAKE (1) TABLET BY MOUTH TWICE DAILY WITH MEALS. 60 tablet 11  . metoprolol (LOPRESSOR) 100 MG tablet Take 1 tablet (100 mg total) by mouth 2 (two) times daily. 180 tablet 1  . mometasone (NASONEX) 50 MCG/ACT nasal spray Place 2 sprays into the nose 2 (two) times daily. 17 g 12  . nicotine (NICODERM CQ - DOSED IN MG/24 HOURS) 21 mg/24hr patch APPLY 1 PATCH TO THE SKIN DAILY AS DIRECTED. 28 patch 0  . NOVOFINE 32G X 6 MM MISC     . ofloxacin (OCUFLOX) 0.3 % ophthalmic solution     . potassium chloride SA (K-DUR,KLOR-CON) 20 MEQ tablet Take  1 tablet (20 mEq total) by mouth 2 (two) times daily. 60 tablet 5  . topiramate (TOPAMAX) 50 MG tablet Take 1 tablet (50 mg total) by mouth 2 (two) times daily. 60 tablet 6  . ziprasidone (GEODON) 80 MG capsule Take 1 capsule (80 mg total) by mouth 2 (two) times daily with a meal. (Patient taking differently: Take 160 mg by mouth at bedtime. ) 60 capsule 0  . benzonatate (TESSALON PERLES)  100 MG capsule Take 1 capsule (100 mg total) by mouth every 6 (six) hours as needed for cough. (Patient not taking: Reported on 09/15/2014) 30 capsule 1  . ferrous sulfate 325 (65 FE) MG tablet Take 1 tablet (325 mg total) by mouth 3 (three) times daily with meals. (Patient not taking: Reported on 09/15/2014) 90 tablet 3  . guaiFENesin-codeine (ROBITUSSIN AC) 100-10 MG/5ML syrup Take 5 mLs by mouth 3 (three) times daily as needed for cough. (Patient not taking: Reported on 09/15/2014) 120 mL 0  . meclizine (ANTIVERT) 50 MG tablet Take 1 tablet (50 mg total) by mouth 3 (three) times daily as needed for dizziness. (Patient not taking: Reported on 09/15/2014) 30 tablet 0  . meloxicam (MOBIC) 15 MG tablet Take 1 tablet (15 mg total) by mouth daily. For 5 days (Patient not taking: Reported on 09/15/2014) 30 tablet 0  . ondansetron (ZOFRAN ODT) 4 MG disintegrating tablet 4mg  ODT q4 hours prn nausea/vomit (Patient not taking: Reported on 09/15/2014) 12 tablet 0  . saccharomyces boulardii (FLORASTOR) 250 MG capsule Take 1 capsule (250 mg total) by mouth 2 (two) times daily. (Patient not taking: Reported on 09/15/2014) 60 capsule 1   No current facility-administered medications for this visit.   Facility-Administered Medications Ordered in Other Visits  Medication Dose Route Frequency Provider Last Rate Last Dose  . 0.9 %  sodium chloride infusion   Intravenous Once Merryl Hacker, MD      . ondansetron [facility]) injection 4 mg  4 mg Intramuscular Once Merryl Hacker, MD      . pantoprazole (PROTONIX) injection 40 mg  40 mg  Intravenous Once Merryl Hacker, MD        Allergies as of 11/25/2014 - Review Complete 11/25/2014  Allergen Reaction Noted  . Cephalexin  10/14/2013  . Metronidazole Shortness Of Breath and Swelling   . Orange Itching 03/27/2011  . Shrimp [shellfish allergy] Shortness Of Breath and Itching 03/27/2011  . Penicillins Hives and Swelling   . Sulfonamide derivatives Hives   . Glipizide Other (See Comments) 01/15/2011  . Sulfamethoxazole-trimethoprim Rash 02/27/2010    Vitals: BP 143/70 mmHg  Pulse 78  Ht 5\' 4"  (1.626 m)  Wt 212 lb (96.163 kg)  BMI 36.37 kg/m2  SpO2 97% Last Weight:  Wt Readings from Last 1 Encounters:  11/25/14 212 lb (96.163 kg)   Last Height:   Ht Readings from Last 1 Encounters:  11/25/14 5\' 4"  (1.626 m)   Physical exam: Exam: Gen: NAD, conversant, well nourised, obese, well groomed                     Eyes: Conjunctivae clear without exudates or hemorrhage  Speech:    Speech is normal; fluent and spontaneous with normal comprehension.  Cognition:    The patient is oriented to person, place, and time;   Cranial Nerves:    The pupils are equal, round, and reactive to light. The fundi are flat. Visual fields are full to finger confrontation. Extraocular movements are intact. Trigeminal sensation is intact and the muscles of mastication are normal. The face is symmetric. The palate elevates in the midline. Hearing intact. Voice is normal. Shoulder shrug is normal. The tongue has normal motion without fasciculations.   Motor Observation:    No asymmetry, no atrophy, and no involuntary movements noted. Tone:    Normal muscle tone.    Posture:    Posture is normal. normal erect    Strength:  Strength is V/V in the upper and lower limbs.           Assessment/Plan: 49 year old female with worsening headaches, pressure in the head, vision changes, morbid obesity.  Topamax helped in the past but she stopped it when she started feeling better. We'll  restart Topamax. Symptoms also concerning for idiopathic intracranial hypertension. Funduscopic exam normal the patient does describe pressure around the head, blurry vision, hearing her pulse in her ear. MRI of the brain was unremarkable but did show a partially empty sella. We'll order lumbar puncture with opening pressure.    Sarina Ill, MD  [facility] [address] [place], [place] [address]  Phone [phone] Fax [phone]  A total of 15 minutes was spent face-to-face with this patient. Over half this time was spent on counseling patient on the iih diagnosis and different diagnostic and therapeutic options available.

## 2014-11-26 ENCOUNTER — Encounter (HOSPITAL_COMMUNITY): Payer: Self-pay

## 2014-11-26 ENCOUNTER — Emergency Department (INDEPENDENT_AMBULATORY_CARE_PROVIDER_SITE_OTHER)
Admission: EM | Admit: 2014-11-26 | Discharge: 2014-11-26 | Disposition: A | Discharge status: Home / Self Care | Payer: Medicare Other | Source: Home / Self Care | Attending: Family Medicine | Admitting: Family Medicine

## 2014-11-26 ENCOUNTER — Emergency Department (INDEPENDENT_AMBULATORY_CARE_PROVIDER_SITE_OTHER): Payer: Medicare Other

## 2014-11-26 DIAGNOSIS — R509 Fever, unspecified: Secondary | ICD-10-CM | POA: Diagnosis not present

## 2014-11-26 DIAGNOSIS — R05 Cough: Secondary | ICD-10-CM | POA: Diagnosis not present

## 2014-11-26 DIAGNOSIS — J9801 Acute bronchospasm: Secondary | ICD-10-CM

## 2014-11-26 DIAGNOSIS — J441 Chronic obstructive pulmonary disease with (acute) exacerbation: Secondary | ICD-10-CM

## 2014-11-26 DIAGNOSIS — Z720 Tobacco use: Secondary | ICD-10-CM

## 2014-11-26 MED ORDER — ALBUTEROL SULFATE (2.5 MG/3ML) 0.083% IN NEBU
2.5000 mg | INHALATION_SOLUTION | Freq: Once | RESPIRATORY_TRACT | Status: AC
  Administered 2014-11-26: 2.5 mg via RESPIRATORY_TRACT

## 2014-11-26 MED ORDER — ALBUTEROL SULFATE (2.5 MG/3ML) 0.083% IN NEBU
INHALATION_SOLUTION | RESPIRATORY_TRACT | Status: AC
  Filled 2014-11-26: qty 3

## 2014-11-26 MED ORDER — PREDNISONE 20 MG PO TABS: ORAL_TABLET | ORAL | Status: DC

## 2014-11-26 MED ORDER — IPRATROPIUM-ALBUTEROL 0.5-2.5 (3) MG/3ML IN SOLN
3.0000 mL | Freq: Once | RESPIRATORY_TRACT | Status: AC
  Administered 2014-11-26: 3 mL via RESPIRATORY_TRACT

## 2014-11-26 MED ORDER — AZITHROMYCIN 250 MG PO TABS: 250.0000 mg | ORAL_TABLET | Freq: Every day | ORAL | Status: DC

## 2014-11-26 MED ORDER — IPRATROPIUM-ALBUTEROL 0.5-2.5 (3) MG/3ML IN SOLN
RESPIRATORY_TRACT | Status: AC
  Filled 2014-11-26: qty 3

## 2014-11-26 NOTE — Discharge Instructions (Signed)
Bronchospasm A bronchospasm is a spasm or tightening of the airways going into the lungs. During a bronchospasm breathing becomes more difficult because the airways get smaller. When this happens there can be coughing, a whistling sound when breathing (wheezing), and difficulty breathing. Bronchospasm is often associated with asthma, but not all patients who experience a bronchospasm have asthma. CAUSES  A bronchospasm is caused by inflammation or irritation of the airways. The inflammation or irritation may be triggered by:   Allergies (such as to animals, pollen, food, or mold). Allergens that cause bronchospasm may cause wheezing immediately after exposure or many hours later.   Infection. Viral infections are believed to be the most common cause of bronchospasm.   Exercise.   Irritants (such as pollution, cigarette smoke, strong odors, aerosol sprays, and paint fumes).   Weather changes. Winds increase molds and pollens in the air. Rain refreshes the air by washing irritants out. Cold air may cause inflammation.   Stress and emotional upset.  SIGNS AND SYMPTOMS   Wheezing.   Excessive nighttime coughing.   Frequent or severe coughing with a simple cold.   Chest tightness.   Shortness of breath.  DIAGNOSIS  Bronchospasm is usually diagnosed through a history and physical exam. Tests, such as chest X-rays, are sometimes done to look for other conditions. TREATMENT   Inhaled medicines can be given to open up your airways and help you breathe. The medicines can be given using either an inhaler or a nebulizer machine.  Corticosteroid medicines may be given for severe bronchospasm, usually when it is associated with asthma. HOME CARE INSTRUCTIONS   Always have a plan prepared for seeking medical care. Know when to call your health care provider and local emergency services (911 in the [place] Know where you can access local emergency care.  Only take medicines as  directed by your health care provider.  If you were prescribed an inhaler or nebulizer machine, ask your health care provider to explain how to use it correctly. Always use a spacer with your inhaler if you were given one.  It is necessary to remain calm during an attack. Try to relax and breathe more slowly.  Control your home environment in the following ways:   Change your heating and air conditioning filter at least once a month.   Limit your use of fireplaces and wood stoves.  Do not smoke and do not allow smoking in your home.   Avoid exposure to perfumes and fragrances.   Get rid of pests (such as roaches and mice) and their droppings.   Throw away plants if you see mold on them.   Keep your house clean and dust free.   Replace carpet with wood, tile, or vinyl flooring. Carpet can trap dander and dust.   Use allergy-proof pillows, mattress covers, and box spring covers.   Wash bed sheets and blankets every week in hot water and dry them in a dryer.   Use blankets that are made of polyester or cotton.   Wash hands frequently. SEEK MEDICAL CARE IF:   You have muscle aches.   You have chest pain.   The sputum changes from clear or white to yellow, green, gray, or bloody.   The sputum you cough up gets thicker.   There are problems that may be related to the medicine you are given, such as a rash, itching, swelling, or trouble breathing.  SEEK IMMEDIATE MEDICAL CARE IF:   You have worsening wheezing and coughing even  after taking your prescribed medicines.   You have increased difficulty breathing.   You develop severe chest pain. MAKE SURE YOU:   Understand these instructions.  Will watch your condition.  Will get help right away if you are not doing well or get worse. Document Released: 04/05/2003 Document Revised: 04/07/2013 Document Reviewed: 09/22/2012 [facility] Patient Information 2015 [place], [place]. This information is not  intended to replace advice given to you by your health care provider. Make sure you discuss any questions you have with your health care provider.  Chronic Obstructive Pulmonary Disease Exacerbation Chronic obstructive pulmonary disease (COPD) is a common lung condition in which airflow from the lungs is limited. COPD is a general term that can be used to describe many different lung problems that limit airflow, including chronic bronchitis and emphysema. COPD exacerbations are episodes when breathing symptoms become much worse and require extra treatment. Without treatment, COPD exacerbations can be life threatening, and frequent COPD exacerbations can cause further damage to your lungs. CAUSES   Respiratory infections.   Exposure to smoke.   Exposure to air pollution, chemical fumes, or dust. Sometimes there is no apparent cause or trigger. RISK FACTORS  Smoking cigarettes.  Older age.  Frequent prior COPD exacerbations. SIGNS AND SYMPTOMS   Increased coughing.   Increased thick spit (sputum) production.   Increased wheezing.   Increased shortness of breath.   Rapid breathing.   Chest tightness. DIAGNOSIS  Your medical history, a physical exam, and tests will help your health care provider make a diagnosis. Tests may include:  A chest X-ray.  Basic lab tests.  Sputum testing.  An arterial blood gas test. TREATMENT  Depending on the severity of your COPD exacerbation, you may need to be admitted to a hospital for treatment. Some of the treatments commonly used to treat COPD exacerbations are:   Antibiotic medicines.   Bronchodilators. These are drugs that expand the air passages. They may be given with an inhaler or nebulizer. Spacer devices may be needed to help improve drug delivery.  Corticosteroid medicines.  Supplemental oxygen therapy.  HOME CARE INSTRUCTIONS   Do not smoke. Quitting smoking is very important to prevent COPD from getting worse and  exacerbations from happening as often.  Avoid exposure to all substances that irritate the airway, especially to tobacco smoke.   If you were prescribed an antibiotic medicine, finish it all even if you start to feel better.  Take all medicines as directed by your health care provider.It is important to use correct technique with inhaled medicines.  Drink enough fluids to keep your urine clear or pale yellow (unless you have a medical condition that requires fluid restriction).  Use a cool mist vaporizer. This makes it easier to clear your chest when you cough.   If you have a home nebulizer and oxygen, continue to use them as directed.   Maintain all necessary vaccinations to prevent infections.   Exercise regularly.   Eat a healthy diet.   Keep all follow-up appointments as directed by your health care provider. SEEK IMMEDIATE MEDICAL CARE IF:  You have worsening shortness of breath.   You have trouble talking.   You have severe chest pain.  You have blood in your sputum.  You have a fever.  You have weakness, vomit repeatedly, or faint.   You feel confused.   You continue to get worse. MAKE SURE YOU:   Understand these instructions.  Will watch your condition.  Will get help right away  if you are not doing well or get worse. Document Released: 01/28/2007 Document Revised: 08/17/2013 Document Reviewed: 12/05/2012 [facility] Patient Information 2015 [place], [place]. This information is not intended to replace advice given to you by your health care provider. Make sure you discuss any questions you have with your health care provider.  Shortness of Breath Shortness of breath means you have trouble breathing. It could also mean that you have a medical problem. You should get immediate medical care for shortness of breath. CAUSES   Not enough oxygen in the air such as with high altitudes or a smoke-filled room.  Certain lung diseases, infections, or  problems.  Heart disease or conditions, such as angina or heart failure.  Low red blood cells (anemia).  Poor physical fitness, which can cause shortness of breath when you exercise.  Chest or back injuries or stiffness.  Being overweight.  Smoking.  Anxiety, which can make you feel like you are not getting enough air. DIAGNOSIS  Serious medical problems can often be found during your physical exam. Tests may also be done to determine why you are having shortness of breath. Tests may include:  Chest X-rays.  Lung function tests.  Blood tests.  An electrocardiogram (ECG).  An ambulatory electrocardiogram. An ambulatory ECG records your heartbeat patterns over a 24-hour period.  Exercise testing.  A transthoracic echocardiogram (TTE). During echocardiography, sound waves are used to evaluate how blood flows through your heart.  A transesophageal echocardiogram (TEE).  Imaging scans. Your health care provider may not be able to find a cause for your shortness of breath after your exam. In this case, it is important to have a follow-up exam with your health care provider as directed.  TREATMENT  Treatment for shortness of breath depends on the cause of your symptoms and can vary greatly. HOME CARE INSTRUCTIONS   Do not smoke. Smoking is a common cause of shortness of breath. If you smoke, ask for help to quit.  Avoid being around chemicals or things that may bother your breathing, such as paint fumes and dust.  Rest as needed. Slowly resume your usual activities.  If medicines were prescribed, take them as directed for the full length of time directed. This includes oxygen and any inhaled medicines.  Keep all follow-up appointments as directed by your health care provider. SEEK MEDICAL CARE IF:   Your condition does not improve in the time expected.  You have a hard time doing your normal activities even with rest.  You have any new symptoms. SEEK IMMEDIATE MEDICAL  CARE IF:   Your shortness of breath gets worse.  You feel light-headed, faint, or develop a cough not controlled with medicines.  You start coughing up blood.  You have pain with breathing.  You have chest pain or pain in your arms, shoulders, or abdomen.  You have a fever.  You are unable to walk up stairs or exercise the way you normally do. MAKE SURE YOU:  Understand these instructions.  Will watch your condition.  Will get help right away if you are not doing well or get worse. Document Released: 12/26/2000 Document Revised: 04/07/2013 Document Reviewed: 06/18/2011 [facility] Patient Information 2015 [place], [place]. This information is not intended to replace advice given to you by your health care provider. Make sure you discuss any questions you have with your health care provider.

## 2014-11-26 NOTE — ED Notes (Signed)
C/o fever, cough x 3 days, reports SOB w exertion.

## 2014-11-26 NOTE — ED Provider Notes (Signed)
CSN: [id number]     Arrival date & time 11/26/14  22 History   First MD Initiated Contact with Patient 11/26/14 1328     Chief Complaint  Patient presents with  . Cough  . Fever   (Consider location/radiation/quality/duration/timing/severity/associated sxs/prior Treatment) HPI Comments: 49 sure old female complains of cough, dyspnea on exertion and fever of 102 for 3 days. She has an albuterol HFA. Has only been using it sparingly. Her last use was last evening. Is significant for arteriosclerotic cardiovascular disease, type 2 diabetes mellitus, hypertension, community-acquired pneumonia, schizoaffective disorder, diastolic dysfunction, pulmonary hypertension and history of alcohol abuse. She continues to smoke a few puffs cigars a day.   Past Medical History  Diagnosis Date  . Arteriosclerotic cardiovascular disease (ASCVD)     Minimal at cath in [facility].stress nuclear study in 8/08 with nl EF; neg stress echo in 2010  . Diabetes mellitus, type 2 2000    Onset in 2000; no insulin  . Hyperlipidemia   . Hypertension `    during treatment with Geodon  . Gastroesophageal reflux disease     Schatzki's ring  . Anemia, iron deficiency   . Alcohol abuse   . Depression   . Community acquired pneumonia 01/03/10, 05/2010, 04/2012    2011; with pleural effusion-[facility] acute resp failure; intubated in Jan 2014 (HMPV pneumonia)  . Obesity   . Schizoaffective disorder     requiring multiple psychiatric admissions  . Dysphagia   . Diastolic dysfunction     grade 2 per echo 2011  . Pulmonary hypertension 05/02/2012    Patient needs repeat echo in 06/2012   . History of alcohol abuse 07/22/2007    Qualifier: Diagnosis of  By: Lenn Cal    . PTSD (post-traumatic stress disorder)    Past Surgical History  Procedure Laterality Date  . Dilation and curettage, diagnostic / therapeutic  1992  . Esophagogastroduodenoscopy  09/16/08    Dr. Trevor Iha hiatal hernia/excoriations  involving the cardia and mucosa consistent with trauma, antral erosions  of linear petechiae ? gastritis versus early gastric antral vascular  ectasia.Marland Kitchen biopsy showed reactive gastropathy. No H. pylori.  . Esophagogastroduodenoscopy  09/2007    Dr. Evalee Mutton ring, dilated to 72 French Maloney dilator, small hiatal hernia, antral erosions, biopsies reactive gastropathy.  Azzie Almas dilation  07/17/2011    [facility] sedation-->distal esophageal stricture s/p dilation, chronic gastritis, multiple ulcers in stomach. no h.pylori  . Colonoscopy  01/2006    internal hemorrhoids  . Colonoscopy  01/10/2012    Dr. Rourk:Single anal canal hemorrhoidal tag likely source of  trivial hematochezia; right-sided colonic diverticulosis  . Esophagogastroduodenoscopy (egd) with propofol N/A 12/17/2013    FXT:KWIOXBD antral erosions and petechiae. Small hiatal hernia. No endoscopic explanation for patient's symptoms   Family History  Problem Relation Age of Onset  . Colon cancer Other   . Hypertension Mother   . Stroke Father     deceased at age 47  . Heart disease Sister   . Anesthesia problems Neg Hx   . Hypotension Neg Hx   . Malignant hyperthermia Neg Hx   . Pseudochol deficiency Neg Hx   . Diabetes    . High Cholesterol    . Arthritis     Social History  Substance Use Topics  . Smoking status: Current Every Day Smoker -- 1.00 packs/day for 30 years    Types: Cigarettes    Start date: 05/18/2012  . Smokeless tobacco: Never Used  Comment: cutting back,  . Alcohol Use: No     Comment: hx of ETOH abuse   OB History    Gravida Para Term Preterm AB TAB SAB Ectopic Multiple Living   3 2 2  1 1    2      Review of Systems  Constitutional: Positive for fever and activity change.  HENT: Negative.   Respiratory: Positive for cough, shortness of breath and wheezing.   Cardiovascular: Negative for chest pain and leg swelling.  Gastrointestinal: Negative.   Genitourinary: Negative.    Musculoskeletal: Negative.   Skin: Negative for rash.  Neurological: Negative.     Allergies  Cephalexin; Metronidazole; Orange; Shrimp; Penicillins; Sulfonamide derivatives; Glipizide; and Sulfamethoxazole-trimethoprim  Home Medications   Prior to Admission medications   Medication Sig Start Date End Date Taking? Authorizing Provider  ACCU-CHEK FASTCLIX LANCETS MISC Use to check blood sugar 3 to 4 times daily. diag code E11.9. Insulin dependent 09/20/14   Bartholomew Crews, MD  acetaminophen (TYLENOL) 500 MG tablet Take 1,000 mg by mouth every 6 (six) hours as needed for mild pain.    Historical Provider, MD  albuterol (PROVENTIL HFA;VENTOLIN HFA) 108 (90 BASE) MCG/ACT inhaler Inhale 2 puffs into the lungs 3 (three) times daily as needed for wheezing or shortness of breath. 10/27/12   Rigoberto Noel, MD  albuterol (PROVENTIL) (2.5 MG/3ML) 0.083% nebulizer solution Take 3 mLs (2.5 mg total) by nebulization every 6 (six) hours as needed for wheezing or shortness of breath. 03/17/14   Otho Bellows, MD  azithromycin (ZITHROMAX) 250 MG tablet Take 1 tablet (250 mg total) by mouth daily. Take first 2 tablets together, then 1 every day until finished. 11/26/14   Janne Napoleon, NP  esomeprazole (NEXIUM) 40 MG capsule TAKE ONE CAPSULE BY MOUTH TWICE DAILY BEFORE A MEAL. 10/07/14   Sid Falcon, MD  furosemide (LASIX) 20 MG tablet Take 1 tablet (20 mg total) by mouth daily. 07/13/14   Norman Herrlich, MD  glucose blood (ACCU-CHEK SMARTVIEW) test strip Use to check blood sugar 3 to 4 times daily. diag code E11.9. Insulin dependent. 09/20/14   Bartholomew Crews, MD  hydrochlorothiazide (HYDRODIURIL) 12.5 MG tablet Take 12.5 mg by mouth daily. 08/12/14   Historical Provider, MD  HYDROcodone-acetaminophen (NORCO/VICODIN) 5-325 MG per tablet Take by mouth. 08/02/14   Historical Provider, MD  hydrOXYzine (ATARAX/VISTARIL) 10 MG tablet Take 10 mg by mouth daily. 06/22/14   Historical Provider, MD  ibuprofen  (ADVIL,MOTRIN) 200 MG tablet Take 400 mg by mouth every 6 (six) hours as needed for mild pain.    Historical Provider, MD  Insulin Glargine (LANTUS SOLOSTAR) 100 UNIT/ML Solostar Pen Inject 18 Units into the skin at bedtime. 09/15/14   Francesca Oman, DO  lisinopril (PRINIVIL,ZESTRIL) 40 MG tablet Take 1 tablet (40 mg total) by mouth daily. 10/20/14   Francesca Oman, DO  loperamide (IMODIUM A-D) 2 MG tablet Take 1 tablet (2 mg total) by mouth 4 (four) times daily as needed for diarrhea or loose stools. 06/23/14   Norman Herrlich, MD  loratadine (CLARITIN) 10 MG tablet Take 1 tablet (10 mg total) by mouth daily. 09/15/14   Francesca Oman, DO  lovastatin (MEVACOR) 20 MG tablet Take 1 tablet (20 mg total) by mouth at bedtime. 05/05/14   Norman Herrlich, MD  metFORMIN (GLUCOPHAGE) 1000 MG tablet TAKE (1) TABLET BY MOUTH TWICE DAILY WITH MEALS. 11/01/14   Francesca Oman, DO  metoprolol (LOPRESSOR) 100  MG tablet Take 1 tablet (100 mg total) by mouth 2 (two) times daily. 10/20/14   Francesca Oman, DO  mometasone (NASONEX) 50 MCG/ACT nasal spray Place 2 sprays into the nose 2 (two) times daily. 02/22/14   Nino Glow McLean-Scocozza, MD  nicotine (NICODERM CQ - DOSED IN MG/24 HOURS) 21 mg/24hr patch APPLY 1 PATCH TO THE SKIN DAILY AS DIRECTED. 11/03/14   Francesca Oman, DO  NOVOFINE 32G X 6 MM MISC  06/22/14   Historical Provider, MD  ofloxacin (OCUFLOX) 0.3 % ophthalmic solution  07/06/14   Historical Provider, MD  potassium chloride SA (K-DUR,KLOR-CON) 20 MEQ tablet Take 1 tablet (20 mEq total) by mouth 2 (two) times daily. 07/27/14   Norman Herrlich, MD  predniSONE (DELTASONE) 20 MG tablet Take 3 tabs po on first day, 2 tabs second day, 2 tabs third day, 1 tab fourth day, 1 tab 5th day. Take with food. 11/26/14   Janne Napoleon, NP  topiramate (TOPAMAX) 50 MG tablet Take 1 tablet (50 mg total) by mouth 2 (two) times daily. 11/25/14   Melvenia Beam, MD   BP 152/86 mmHg  Pulse 82  Temp(Src) 99.2 F (37.3 C) (Oral)  SpO2 100%  LMP  11/17/2014 Physical Exam  Constitutional: She is oriented to person, place, and time. She appears well-developed and well-nourished. No distress.  HENT:  Oropharynx is brown, discolored from smoking. Posterior pharynx with minor erythema and scant clear PND. No exudates   Eyes: Conjunctivae and EOM are normal.  Neck: Normal range of motion. Neck supple.  Cardiovascular: Normal rate, regular rhythm and normal heart sounds.   Pulmonary/Chest: Effort normal.  Cough is coarse. Clear inspiration. Prolonged expiratory phase and distant breath sounds on expiration but no audible wheezes.  Musculoskeletal: Normal range of motion. She exhibits no edema.  Lymphadenopathy:    She has no cervical adenopathy.  Neurological: She is alert and oriented to person, place, and time. She exhibits normal muscle tone.  Skin: Skin is warm and dry.  Nursing note and vitals reviewed.   ED Course  Procedures (including critical care time) Labs Review Labs Reviewed - No data to display  Imaging Review Dg Chest 2 View  11/26/2014   CLINICAL DATA:  Cough, fever.  EXAM: CHEST  2 VIEW  COMPARISON:  October 02, 2014.  FINDINGS: The heart size and mediastinal contours are within normal limits. Both lungs are clear. No pneumothorax or pleural effusion is noted. The visualized skeletal structures are unremarkable.  IMPRESSION: No active cardiopulmonary disease.   Electronically Signed   By: Marijo Conception, M.D.   On: 11/26/2014 14:17     MDM   1. COPD exacerbation   2. Bronchospasm   3. Tobacco use    z-pack Albuterol HFA as directed and use your home neb as directed Duoneb 5/2.5 mg. Moderate improvement in air movement. No wheezing, coarse cough. Prednisone taper pack See your dr next week. Stop smoking.     Janne Napoleon, NP 11/26/14 1450

## 2014-11-28 ENCOUNTER — Encounter: Payer: Self-pay | Admitting: Neurology

## 2014-11-28 ENCOUNTER — Telehealth: Payer: Self-pay | Admitting: Neurology

## 2014-11-28 NOTE — Telephone Encounter (Signed)
[name], Can you call [facility] imaging and get her set up for a lumbar puncture please? See order. thanks

## 2014-11-29 NOTE — Telephone Encounter (Signed)
Faxed order to [facility] imaging to have pt scheduled for LP. Faxed to [phone] to [place]. Received fax confirmation.

## 2014-12-02 ENCOUNTER — Other Ambulatory Visit: Payer: Self-pay | Admitting: *Deleted

## 2014-12-02 DIAGNOSIS — I272 Pulmonary hypertension, unspecified: Secondary | ICD-10-CM

## 2014-12-02 DIAGNOSIS — B9789 Other viral agents as the cause of diseases classified elsewhere: Secondary | ICD-10-CM

## 2014-12-02 DIAGNOSIS — J988 Other specified respiratory disorders: Principal | ICD-10-CM

## 2014-12-02 MED ORDER — ALBUTEROL SULFATE (2.5 MG/3ML) 0.083% IN NEBU: 2.5000 mg | INHALATION_SOLUTION | Freq: Four times a day (QID) | RESPIRATORY_TRACT | Status: DC | PRN

## 2014-12-02 MED ORDER — ALBUTEROL SULFATE HFA 108 (90 BASE) MCG/ACT IN AERS: 2.0000 | INHALATION_SPRAY | Freq: Three times a day (TID) | RESPIRATORY_TRACT | Status: DC | PRN

## 2014-12-04 ENCOUNTER — Emergency Department (HOSPITAL_COMMUNITY): Payer: Medicare Other

## 2014-12-04 ENCOUNTER — Encounter (HOSPITAL_COMMUNITY): Payer: Self-pay | Admitting: Emergency Medicine

## 2014-12-04 ENCOUNTER — Emergency Department (HOSPITAL_COMMUNITY)
Admission: EM | Admit: 2014-12-04 | Discharge: 2014-12-05 | Disposition: A | Discharge status: Home / Self Care | Payer: Medicare Other | Attending: Emergency Medicine | Admitting: Emergency Medicine

## 2014-12-04 DIAGNOSIS — Z720 Tobacco use: Secondary | ICD-10-CM | POA: Insufficient documentation

## 2014-12-04 DIAGNOSIS — Z862 Personal history of diseases of the blood and blood-forming organs and certain disorders involving the immune mechanism: Secondary | ICD-10-CM | POA: Insufficient documentation

## 2014-12-04 DIAGNOSIS — Z7951 Long term (current) use of inhaled steroids: Secondary | ICD-10-CM | POA: Insufficient documentation

## 2014-12-04 DIAGNOSIS — F329 Major depressive disorder, single episode, unspecified: Secondary | ICD-10-CM | POA: Diagnosis not present

## 2014-12-04 DIAGNOSIS — I2510 Atherosclerotic heart disease of native coronary artery without angina pectoris: Secondary | ICD-10-CM | POA: Insufficient documentation

## 2014-12-04 DIAGNOSIS — E669 Obesity, unspecified: Secondary | ICD-10-CM | POA: Diagnosis not present

## 2014-12-04 DIAGNOSIS — Z794 Long term (current) use of insulin: Secondary | ICD-10-CM | POA: Insufficient documentation

## 2014-12-04 DIAGNOSIS — K219 Gastro-esophageal reflux disease without esophagitis: Secondary | ICD-10-CM | POA: Diagnosis not present

## 2014-12-04 DIAGNOSIS — E119 Type 2 diabetes mellitus without complications: Secondary | ICD-10-CM | POA: Insufficient documentation

## 2014-12-04 DIAGNOSIS — R1084 Generalized abdominal pain: Secondary | ICD-10-CM | POA: Insufficient documentation

## 2014-12-04 DIAGNOSIS — M792 Neuralgia and neuritis, unspecified: Secondary | ICD-10-CM

## 2014-12-04 DIAGNOSIS — Z880 Allergy status to penicillin: Secondary | ICD-10-CM | POA: Insufficient documentation

## 2014-12-04 DIAGNOSIS — E785 Hyperlipidemia, unspecified: Secondary | ICD-10-CM | POA: Diagnosis not present

## 2014-12-04 DIAGNOSIS — Z79899 Other long term (current) drug therapy: Secondary | ICD-10-CM | POA: Insufficient documentation

## 2014-12-04 DIAGNOSIS — I10 Essential (primary) hypertension: Secondary | ICD-10-CM | POA: Diagnosis not present

## 2014-12-04 DIAGNOSIS — Z8701 Personal history of pneumonia (recurrent): Secondary | ICD-10-CM | POA: Insufficient documentation

## 2014-12-04 DIAGNOSIS — R05 Cough: Secondary | ICD-10-CM | POA: Diagnosis not present

## 2014-12-04 DIAGNOSIS — M549 Dorsalgia, unspecified: Secondary | ICD-10-CM | POA: Diagnosis present

## 2014-12-04 LAB — COMPREHENSIVE METABOLIC PANEL
ALT: 12 U/L — ABNORMAL LOW (ref 14–54)
ANION GAP: 9 (ref 5–15)
AST: 13 U/L — ABNORMAL LOW (ref 15–41)
Albumin: 3.5 g/dL (ref 3.5–5.0)
Alkaline Phosphatase: 84 U/L (ref 38–126)
BUN: 9 mg/dL (ref 6–20)
CO2: 24 mmol/L (ref 22–32)
Calcium: 9.1 mg/dL (ref 8.9–10.3)
Chloride: 101 mmol/L (ref 101–111)
Creatinine, Ser: 1.03 mg/dL — ABNORMAL HIGH (ref 0.44–1.00)
GFR calc Af Amer: >60 mL/min (ref >60)
Glucose, Bld: 262 mg/dL — ABNORMAL HIGH (ref 65–99)
POTASSIUM: 4.1 mmol/L (ref 3.5–5.1)
Sodium: 134 mmol/L — ABNORMAL LOW (ref 135–145)
TOTAL PROTEIN: 6.2 g/dL — AB (ref 6.5–8.1)
Total Bilirubin: 0.4 mg/dL (ref 0.3–1.2)

## 2014-12-04 LAB — CBC
HEMATOCRIT: 34.4 % — AB (ref 36.0–46.0)
HEMOGLOBIN: 11.3 g/dL — AB (ref 12.0–15.0)
MCH: 27.4 pg (ref 26.0–34.0)
MCHC: 32.8 g/dL (ref 30.0–36.0)
MCV: 83.3 fL (ref 78.0–100.0)
Platelets: 291 10*3/uL (ref 150–400)
RBC: 4.13 MIL/uL (ref 3.87–5.11)
RDW: 17.6 % — ABNORMAL HIGH (ref 11.5–15.5)
WBC: 9.2 10*3/uL (ref 4.0–10.5)

## 2014-12-04 LAB — CBG MONITORING, ED: Glucose-Capillary: 252 mg/dL — ABNORMAL HIGH (ref 65–99)

## 2014-12-04 LAB — I-STAT TROPONIN, ED: TROPONIN I, POC: 0 ng/mL (ref 0.00–0.08)

## 2014-12-04 LAB — LIPASE, BLOOD: LIPASE: 26 U/L (ref 22–51)

## 2014-12-04 MED ORDER — SODIUM CHLORIDE 0.9 % IV BOLUS (SEPSIS)
1000.0000 mL | Freq: Once | INTRAVENOUS | Status: AC
  Administered 2014-12-04: 1000 mL via INTRAVENOUS

## 2014-12-04 MED ORDER — LORAZEPAM 2 MG/ML IJ SOLN
0.5000 mg | Freq: Once | INTRAMUSCULAR | Status: AC
  Administered 2014-12-04: 0.5 mg via INTRAVENOUS
  Filled 2014-12-04: qty 1

## 2014-12-05 MED ORDER — LORAZEPAM 1 MG PO TABS: ORAL_TABLET | ORAL | Status: DC

## 2014-12-05 NOTE — Discharge Instructions (Signed)
Follow up with your md next week for recheck °

## 2014-12-05 NOTE — ED Notes (Signed)
Discharge instructions and prescriptions reviewed, voiced understanding. 

## 2014-12-06 NOTE — ED Provider Notes (Signed)
CSN: [id number]     Arrival date & time 12/04/14  2053 History   First MD Initiated Contact with Patient 12/04/14 2232     Chief Complaint  Patient presents with  . Abdominal Pain  . Back Pain     (Consider location/radiation/quality/duration/timing/severity/associated sxs/prior Treatment) Patient is a 49 y.o. female presenting with abdominal pain and back pain. The history is provided by the patient (pt complains of burning in her chest and abd.  no sob or sweating).  Abdominal Pain Pain location:  Generalized Pain quality: aching   Pain radiates to:  Does not radiate Pain severity:  Mild Onset quality:  Gradual Timing:  Intermittent Progression:  Waxing and waning Chronicity:  New Associated symptoms: no chest pain, no cough, no diarrhea, no fatigue and no hematuria   Back Pain Associated symptoms: abdominal pain   Associated symptoms: no chest pain and no headaches     Past Medical History  Diagnosis Date  . Arteriosclerotic cardiovascular disease (ASCVD)     Minimal at cath in [facility].stress nuclear study in 8/08 with nl EF; neg stress echo in 2010  . Diabetes mellitus, type 2 2000    Onset in 2000; no insulin  . Hyperlipidemia   . Hypertension `    during treatment with Geodon  . Gastroesophageal reflux disease     Schatzki's ring  . Anemia, iron deficiency   . Alcohol abuse   . Depression   . Community acquired pneumonia 01/03/10, 05/2010, 04/2012    2011; with pleural effusion-[facility] acute resp failure; intubated in Jan 2014 (HMPV pneumonia)  . Obesity   . Schizoaffective disorder     requiring multiple psychiatric admissions  . Dysphagia   . Diastolic dysfunction     grade 2 per echo 2011  . Pulmonary hypertension 05/02/2012    Patient needs repeat echo in 06/2012   . History of alcohol abuse 07/22/2007    Qualifier: Diagnosis of  By: Lenn Cal    . PTSD (post-traumatic stress disorder)    Past Surgical History  Procedure Laterality Date   . Dilation and curettage, diagnostic / therapeutic  1992  . Esophagogastroduodenoscopy  09/16/08    Dr. Trevor Iha hiatal hernia/excoriations involving the cardia and mucosa consistent with trauma, antral erosions  of linear petechiae ? gastritis versus early gastric antral vascular  ectasia.Marland Kitchen biopsy showed reactive gastropathy. No H. pylori.  . Esophagogastroduodenoscopy  09/2007    Dr. Evalee Mutton ring, dilated to 14 French Maloney dilator, small hiatal hernia, antral erosions, biopsies reactive gastropathy.  Azzie Almas dilation  07/17/2011    [facility] sedation-->distal esophageal stricture s/p dilation, chronic gastritis, multiple ulcers in stomach. no h.pylori  . Colonoscopy  01/2006    internal hemorrhoids  . Colonoscopy  01/10/2012    Dr. Rourk:Single anal canal hemorrhoidal tag likely source of  trivial hematochezia; right-sided colonic diverticulosis  . Esophagogastroduodenoscopy (egd) with propofol N/A 12/17/2013    VEL:FYBOFBP antral erosions and petechiae. Small hiatal hernia. No endoscopic explanation for patient's symptoms   Family History  Problem Relation Age of Onset  . Colon cancer Other   . Hypertension Mother   . Stroke Father     deceased at age 56  . Heart disease Sister   . Anesthesia problems Neg Hx   . Hypotension Neg Hx   . Malignant hyperthermia Neg Hx   . Pseudochol deficiency Neg Hx   . Diabetes    . High Cholesterol    . Arthritis  Social History  Substance Use Topics  . Smoking status: Current Every Day Smoker -- 1.00 packs/day for 30 years    Types: Cigarettes    Start date: 05/18/2012  . Smokeless tobacco: Never Used     Comment: cutting back,  . Alcohol Use: No     Comment: hx of ETOH abuse   OB History    Gravida Para Term Preterm AB TAB SAB Ectopic Multiple Living   3 2 2  1 1    2      Review of Systems  Constitutional: Negative for appetite change and fatigue.  HENT: Negative for congestion, ear discharge and sinus pressure.    Eyes: Negative for discharge.  Respiratory: Negative for cough.   Cardiovascular: Negative for chest pain.  Gastrointestinal: Positive for abdominal pain. Negative for diarrhea.  Genitourinary: Negative for frequency and hematuria.  Skin: Negative for rash.  Neurological: Negative for seizures and headaches.  Psychiatric/Behavioral: Negative for hallucinations.      Allergies  Cephalexin; Metronidazole; Orange; Shrimp; Penicillins; Sulfonamide derivatives; Glipizide; and Sulfamethoxazole-trimethoprim  Home Medications   Prior to Admission medications   Medication Sig Start Date End Date Taking? Authorizing Provider  ACCU-CHEK FASTCLIX LANCETS MISC Use to check blood sugar 3 to 4 times daily. diag code E11.9. Insulin dependent 09/20/14   Bartholomew Crews, MD  acetaminophen (TYLENOL) 500 MG tablet Take 1,000 mg by mouth every 6 (six) hours as needed for mild pain.    Historical Provider, MD  albuterol (PROVENTIL HFA;VENTOLIN HFA) 108 (90 BASE) MCG/ACT inhaler Inhale 2 puffs into the lungs 3 (three) times daily as needed for wheezing or shortness of breath. 12/02/14   Francesca Oman, DO  albuterol (PROVENTIL) (2.5 MG/3ML) 0.083% nebulizer solution Take 3 mLs (2.5 mg total) by nebulization every 6 (six) hours as needed for wheezing or shortness of breath. 12/02/14   Francesca Oman, DO  azithromycin (ZITHROMAX) 250 MG tablet Take 1 tablet (250 mg total) by mouth daily. Take first 2 tablets together, then 1 every day until finished. 11/26/14   Janne Napoleon, NP  esomeprazole (NEXIUM) 40 MG capsule TAKE ONE CAPSULE BY MOUTH TWICE DAILY BEFORE A MEAL. 10/07/14   Sid Falcon, MD  furosemide (LASIX) 20 MG tablet Take 1 tablet (20 mg total) by mouth daily. 07/13/14   Norman Herrlich, MD  glucose blood (ACCU-CHEK SMARTVIEW) test strip Use to check blood sugar 3 to 4 times daily. diag code E11.9. Insulin dependent. 09/20/14   Bartholomew Crews, MD  hydrochlorothiazide (HYDRODIURIL) 12.5 MG tablet Take 12.5  mg by mouth daily. 08/12/14   Historical Provider, MD  HYDROcodone-acetaminophen (NORCO/VICODIN) 5-325 MG per tablet Take by mouth. 08/02/14   Historical Provider, MD  hydrOXYzine (ATARAX/VISTARIL) 10 MG tablet Take 10 mg by mouth daily. 06/22/14   Historical Provider, MD  ibuprofen (ADVIL,MOTRIN) 200 MG tablet Take 400 mg by mouth every 6 (six) hours as needed for mild pain.    Historical Provider, MD  Insulin Glargine (LANTUS SOLOSTAR) 100 UNIT/ML Solostar Pen Inject 18 Units into the skin at bedtime. 09/15/14   Francesca Oman, DO  lisinopril (PRINIVIL,ZESTRIL) 40 MG tablet Take 1 tablet (40 mg total) by mouth daily. 10/20/14   Francesca Oman, DO  loperamide (IMODIUM A-D) 2 MG tablet Take 1 tablet (2 mg total) by mouth 4 (four) times daily as needed for diarrhea or loose stools. 06/23/14   Norman Herrlich, MD  loratadine (CLARITIN) 10 MG tablet Take 1 tablet (10 mg total)  by mouth daily. 09/15/14   Francesca Oman, DO  LORazepam (ATIVAN) 1 MG tablet Take one tablet every 12 hours if needed for burning sensation 12/05/14   Milton Ferguson, MD  lovastatin (MEVACOR) 20 MG tablet Take 1 tablet (20 mg total) by mouth at bedtime. 05/05/14   Norman Herrlich, MD  metFORMIN (GLUCOPHAGE) 1000 MG tablet TAKE (1) TABLET BY MOUTH TWICE DAILY WITH MEALS. 11/01/14   Francesca Oman, DO  metoprolol (LOPRESSOR) 100 MG tablet Take 1 tablet (100 mg total) by mouth 2 (two) times daily. 10/20/14   Francesca Oman, DO  mometasone (NASONEX) 50 MCG/ACT nasal spray Place 2 sprays into the nose 2 (two) times daily. 02/22/14   Nino Glow McLean-Scocozza, MD  nicotine (NICODERM CQ - DOSED IN MG/24 HOURS) 21 mg/24hr patch APPLY 1 PATCH TO THE SKIN DAILY AS DIRECTED. 11/03/14   Francesca Oman, DO  NOVOFINE 32G X 6 MM MISC  06/22/14   Historical Provider, MD  ofloxacin (OCUFLOX) 0.3 % ophthalmic solution  07/06/14   Historical Provider, MD  potassium chloride SA (K-DUR,KLOR-CON) 20 MEQ tablet Take 1 tablet (20 mEq total) by mouth 2 (two) times daily. 07/27/14   Norman Herrlich, MD  predniSONE (DELTASONE) 20 MG tablet Take 3 tabs po on first day, 2 tabs second day, 2 tabs third day, 1 tab fourth day, 1 tab 5th day. Take with food. 11/26/14   Janne Napoleon, NP  topiramate (TOPAMAX) 50 MG tablet Take 1 tablet (50 mg total) by mouth 2 (two) times daily. 11/25/14   Melvenia Beam, MD   BP 158/72 mmHg  Pulse 82  Temp(Src) 97.8 F (36.6 C) (Oral)  Resp 21  Ht 5\' 4"  (1.626 m)  Wt 208 lb 2 oz (94.405 kg)  BMI 35.71 kg/m2  SpO2 100%  LMP 12/04/2014 (Exact Date) Physical Exam  Constitutional: She is oriented to person, place, and time. She appears well-developed.  HENT:  Head: Normocephalic.  Eyes: Conjunctivae and EOM are normal. No scleral icterus.  Neck: Neck supple. No thyromegaly present.  Cardiovascular: Normal rate and regular rhythm.  Exam reveals no gallop and no friction rub.   No murmur heard. Pulmonary/Chest: No stridor. She has no wheezes. She has no rales. She exhibits no tenderness.  Abdominal: She exhibits no distension. There is no tenderness. There is no rebound.  Musculoskeletal: Normal range of motion. She exhibits no edema.  Lymphadenopathy:    She has no cervical adenopathy.  Neurological: She is oriented to person, place, and time. She exhibits normal muscle tone. Coordination normal.  Skin: No rash noted. No erythema.  Psychiatric:  Pt anxious    ED Course  Procedures (including critical care time) Labs Review Labs Reviewed  COMPREHENSIVE METABOLIC PANEL - Abnormal; Notable for the following:    Sodium 134 (*)    Glucose, Bld 262 (*)    Creatinine, Ser 1.03 (*)    Total Protein 6.2 (*)    AST 13 (*)    ALT 12 (*)    All other components within normal limits  CBC - Abnormal; Notable for the following:    Hemoglobin 11.3 (*)    HCT 34.4 (*)    RDW 17.6 (*)    All other components within normal limits  CBG MONITORING, ED - Abnormal; Notable for the following:    Glucose-Capillary 252 (*)    All other components within  normal limits  LIPASE, BLOOD  I-STAT TROPOININ, ED    Imaging Review  Dg Chest Port 1 View  12/04/2014   CLINICAL DATA:  Acute onset of dry constant cough. Initial encounter.  EXAM: PORTABLE CHEST - 1 VIEW  COMPARISON:  Chest radiograph performed 11/26/2014  FINDINGS: The lungs are well-aerated and clear. There is no evidence of focal opacification, pleural effusion or pneumothorax.  The cardiomediastinal silhouette is within normal limits. No acute osseous abnormalities are seen.  IMPRESSION: No acute cardiopulmonary process seen.   Electronically Signed   By: Garald Balding M.D.   On: 12/04/2014 23:18   I have personally reviewed and evaluated these images and lab results as part of my medical decision-making.   EKG Interpretation   Date/Time:  Saturday December 04 2014 23:56:34 EDT Ventricular Rate:  75 PR Interval:  114 QRS Duration: 81 QT Interval:  438 QTC Calculation: 489 R Axis:   48 Text Interpretation:  Sinus rhythm Borderline short PR interval Borderline  prolonged QT interval Confirmed by Emmajane Altamura  MD, Katerra Ingman ([id number]) on 12/04/2014  11:59:24 PM      MDM   Final diagnoses:  Neuritis    Pt improved with ativan,   Labs and x-ray unremarkable except dm.  Doubt cad or significant abd pathology,  Suspect,  Anxiety and exacerbation of her bipolar disease,  Will rx ativan and follow up with pcp  Milton Ferguson, MD 12/06/14 [phone]

## 2014-12-08 ENCOUNTER — Telehealth: Payer: Self-pay | Admitting: Internal Medicine

## 2014-12-08 NOTE — Telephone Encounter (Signed)
Made PT an appointment for symptoms of slow heart beat, dizziness & chronic fatigue.

## 2014-12-08 NOTE — Telephone Encounter (Signed)
New message      Pt sees Dr Harrington Challenger in [place].  She states her heart feels like it is slowing down.  No other symptoms.  Please advise. (Pt called [address] office because she said someone in the [place] office told her there were no openings today and did not offer a nurse to call her)

## 2014-12-09 ENCOUNTER — Encounter: Payer: Self-pay | Admitting: Adult Health

## 2014-12-09 ENCOUNTER — Ambulatory Visit (INDEPENDENT_AMBULATORY_CARE_PROVIDER_SITE_OTHER): Payer: Medicare Other | Admitting: Adult Health

## 2014-12-09 VITALS — BP 122/80 | HR 92 | Ht 64.0 in | Wt 201.0 lb

## 2014-12-09 DIAGNOSIS — I5032 Chronic diastolic (congestive) heart failure: Secondary | ICD-10-CM

## 2014-12-09 DIAGNOSIS — R0600 Dyspnea, unspecified: Secondary | ICD-10-CM

## 2014-12-09 MED ORDER — NITROGLYCERIN 0.3 MG SL SUBL: 0.3000 mg | SUBLINGUAL_TABLET | SUBLINGUAL | Status: DC | PRN

## 2014-12-09 NOTE — Progress Notes (Signed)
Name: [name]    DOB: June 23, 1965  Age: 49 y.o.  MR#: [id number]       PCP:  Duwaine Maxin, DO      Insurance: Payor: MEDICARE / Plan: MEDICARE PART A AND B / Product Type: *No Product type* /   CC:    Chief Complaint  Patient presents with  . Coronary Artery Disease  . Congestive Heart Failure    Diastolic     VS Filed Vitals:   12/09/14 1551  BP: 122/80  Pulse: 92  Height: 5\' 4"  (1.626 m)  Weight: 201 lb (91.173 kg)  SpO2: 95%    Weights Current Weight  12/09/14 201 lb (91.173 kg)  12/04/14 208 lb 2 oz (94.405 kg)  11/25/14 212 lb (96.163 kg)    Blood Pressure  BP Readings from Last 3 Encounters:  12/09/14 122/80  12/05/14 158/72  11/26/14 152/86     Admit date:  (Not on file) Last encounter with RMR:  06/29/2014   Allergy Cephalexin; Metronidazole; Orange; Shrimp; Penicillins; Sulfonamide derivatives; Glipizide; and Sulfamethoxazole-trimethoprim  Current Outpatient Prescriptions  Medication Sig Dispense Refill  . ACCU-CHEK FASTCLIX LANCETS MISC Use to check blood sugar 3 to 4 times daily. diag code E11.9. Insulin dependent 204 each 11  . acetaminophen (TYLENOL) 500 MG tablet Take 1,000 mg by mouth every 6 (six) hours as needed for mild pain.    Marland Kitchen albuterol (PROVENTIL HFA;VENTOLIN HFA) 108 (90 BASE) MCG/ACT inhaler Inhale 2 puffs into the lungs 3 (three) times daily as needed for wheezing or shortness of breath. 3 Inhaler 0  . albuterol (PROVENTIL) (2.5 MG/3ML) 0.083% nebulizer solution Take 3 mLs (2.5 mg total) by nebulization every 6 (six) hours as needed for wheezing or shortness of breath. 90 mL 3  . esomeprazole (NEXIUM) 40 MG capsule TAKE ONE CAPSULE BY MOUTH TWICE DAILY BEFORE A MEAL. 60 capsule 0  . furosemide (LASIX) 20 MG tablet Take 1 tablet (20 mg total) by mouth daily. 30 tablet 6  . glucose blood (ACCU-CHEK SMARTVIEW) test strip Use to check blood sugar 3 to 4 times daily. diag code E11.9. Insulin dependent. 120 each 11  . hydrochlorothiazide  (HYDRODIURIL) 12.5 MG tablet Take 12.5 mg by mouth daily.    Marland Kitchen HYDROcodone-acetaminophen (NORCO/VICODIN) 5-325 MG per tablet Take by mouth.    . hydrOXYzine (ATARAX/VISTARIL) 10 MG tablet Take 10 mg by mouth daily.    Marland Kitchen ibuprofen (ADVIL,MOTRIN) 200 MG tablet Take 400 mg by mouth every 6 (six) hours as needed for mild pain.    . Insulin Glargine (LANTUS SOLOSTAR) 100 UNIT/ML Solostar Pen Inject 18 Units into the skin at bedtime. (Patient taking differently: Inject 22 Units into the skin at bedtime. ) 15 mL 1  . lisinopril (PRINIVIL,ZESTRIL) 40 MG tablet Take 1 tablet (40 mg total) by mouth daily. 90 tablet 1  . loperamide (IMODIUM A-D) 2 MG tablet Take 1 tablet (2 mg total) by mouth 4 (four) times daily as needed for diarrhea or loose stools. 30 tablet 0  . loratadine (CLARITIN) 10 MG tablet Take 1 tablet (10 mg total) by mouth daily. 30 tablet 3  . LORazepam (ATIVAN) 1 MG tablet Take one tablet every 12 hours if needed for burning sensation 15 tablet 0  . lovastatin (MEVACOR) 20 MG tablet Take 1 tablet (20 mg total) by mouth at bedtime. 90 tablet 3  . metFORMIN (GLUCOPHAGE) 1000 MG tablet TAKE (1) TABLET BY MOUTH TWICE DAILY WITH MEALS. 60 tablet 11  . metoprolol (LOPRESSOR)  100 MG tablet Take 1 tablet (100 mg total) by mouth 2 (two) times daily. 180 tablet 1  . mometasone (NASONEX) 50 MCG/ACT nasal spray Place 2 sprays into the nose 2 (two) times daily. 17 g 12  . nicotine (NICODERM CQ - DOSED IN MG/24 HOURS) 21 mg/24hr patch APPLY 1 PATCH TO THE SKIN DAILY AS DIRECTED. 28 patch 0  . NOVOFINE 32G X 6 MM MISC     . potassium chloride SA (K-DUR,KLOR-CON) 20 MEQ tablet Take 20 mEq by mouth daily.    Marland Kitchen topiramate (TOPAMAX) 50 MG tablet Take 1 tablet (50 mg total) by mouth 2 (two) times daily. 60 tablet 11  . [DISCONTINUED] ziprasidone (GEODON) 80 MG capsule Take 1 capsule (80 mg total) by mouth 2 (two) times daily with a meal. (Patient taking differently: Take 160 mg by mouth at bedtime. ) 60 capsule 0    No current facility-administered medications for this visit.   Facility-Administered Medications Ordered in Other Visits  Medication Dose Route Frequency Provider Last Rate Last Dose  . 0.9 %  sodium chloride infusion   Intravenous Once Merryl Hacker, MD      . ondansetron [facility]) injection 4 mg  4 mg Intramuscular Once Merryl Hacker, MD      . pantoprazole (PROTONIX) injection 40 mg  40 mg Intravenous Once Merryl Hacker, MD        Discontinued Meds:    Medications Discontinued During This Encounter  Medication Reason  . azithromycin (ZITHROMAX) 250 MG tablet Error  . ofloxacin (OCUFLOX) 0.3 % ophthalmic solution Error  . potassium chloride SA (K-DUR,KLOR-CON) 20 MEQ tablet Error  . predniSONE (DELTASONE) 20 MG tablet Error    Patient Active Problem List   Diagnosis Date Noted  . Encounter for health education 10/24/2014  . Fatigue 10/04/2014  . Left knee pain 08/25/2014  . Positional headache 08/12/2014  . Vision changes 08/12/2014  . Pulsatile tinnitus 08/12/2014  . IIH (idiopathic intracranial hypertension) 08/12/2014  . Nausea with vomiting 07/26/2014  . Sinusitis, acute 05/24/2014  . Viral gastroenteritis 05/06/2014  . Viral respiratory illness 03/17/2014  . Seasonal allergies 02/23/2014  . Flank pain, acute 01/01/2014  . Hypokalemia 12/25/2013  . Headache 12/07/2013  . Dyspepsia 11/19/2013  . Leg swelling 10/21/2013  . Sinusitis 10/14/2013  . Diarrhea 10/14/2013  . Daytime hypersomnolence 12/09/2012  . COPD (chronic obstructive pulmonary disease) 10/20/2012  . GERD (gastroesophageal reflux disease) 10/15/2012  . Preventative health care 10/15/2012  . Pulmonary hypertension 05/02/2012  . Diastolic heart failure 83/41/9622  . IDA (iron deficiency anemia) 11/06/2011  . Arteriosclerotic cardiovascular disease (ASCVD)   . TOBACCO ABUSE 09/27/2009  . Hyperlipidemia 07/22/2007  . Obesity 07/22/2007  . Bipolar 1 disorder 07/22/2007  . Essential  hypertension 07/22/2007  . Diabetes mellitus type II, controlled 09/28/2002    LABS    Component Value Date/Time   NA 134* 12/04/2014 2144   NA 133* 10/10/2014 1949   NA 132* 10/04/2014 1555   NA 139 08/12/2014 1037   K 4.1 12/04/2014 2144   K 4.0 10/10/2014 1949   K 4.5 10/04/2014 1555   CL 101 12/04/2014 2144   CL 100* 10/10/2014 1949   CL 102 10/04/2014 1555   CO2 24 12/04/2014 2144   CO2 23 10/10/2014 1949   CO2 20 10/04/2014 1555   GLUCOSE 262* 12/04/2014 2144   GLUCOSE 158* 10/10/2014 1949   GLUCOSE 264* 10/04/2014 1555   GLUCOSE 143* 08/12/2014 1037   BUN 9 12/04/2014  2144   BUN 11 10/10/2014 1949   BUN 19 10/04/2014 1555   BUN 9 08/12/2014 1037   CREATININE 1.03* 12/04/2014 2144   CREATININE 1.04* 10/10/2014 1949   CREATININE 0.93 10/04/2014 1555   CREATININE 0.97 10/02/2014 1421   CREATININE 0.84 06/23/2014 1637   CREATININE 0.79 02/10/2014 1656   CALCIUM 9.1 12/04/2014 2144   CALCIUM 9.5 10/10/2014 1949   CALCIUM 9.8 10/04/2014 1555   GFRNONAA >60 12/04/2014 2144   GFRNONAA >60 10/10/2014 1949   GFRNONAA >60 10/02/2014 1421   GFRNONAA 84 06/23/2014 1637   GFRNONAA >89 02/10/2014 1656   GFRNONAA >89 01/01/2014 1428   GFRAA >60 12/04/2014 2144   GFRAA >60 10/10/2014 1949   GFRAA >60 10/02/2014 1421   GFRAA >89 06/23/2014 1637   GFRAA >89 02/10/2014 1656   GFRAA >89 01/01/2014 1428   CMP     Component Value Date/Time   NA 134* 12/04/2014 2144   NA 139 08/12/2014 1037   K 4.1 12/04/2014 2144   CL 101 12/04/2014 2144   CO2 24 12/04/2014 2144   GLUCOSE 262* 12/04/2014 2144   GLUCOSE 143* 08/12/2014 1037   BUN 9 12/04/2014 2144   BUN 9 08/12/2014 1037   CREATININE 1.03* 12/04/2014 2144   CREATININE 0.93 10/04/2014 1555   CALCIUM 9.1 12/04/2014 2144   PROT 6.2* 12/04/2014 2144   PROT 6.2 08/12/2014 1037   ALBUMIN 3.5 12/04/2014 2144   AST 13* 12/04/2014 2144   ALT 12* 12/04/2014 2144   ALKPHOS 84 12/04/2014 2144   BILITOT 0.4 12/04/2014 2144    BILITOT <0.2 08/12/2014 1037   GFRNONAA >60 12/04/2014 2144   GFRNONAA 84 06/23/2014 1637   GFRAA >60 12/04/2014 2144   GFRAA >89 06/23/2014 1637       Component Value Date/Time   WBC 9.2 12/04/2014 2144   WBC 8.5 10/10/2014 1949   WBC 8.6 10/02/2014 1421   HGB 11.3* 12/04/2014 2144   HGB 12.1 10/10/2014 1949   HGB 13.1 10/02/2014 1421   HGB 10.6* 01/15/2013 1129   HGB 12.0* 12/23/2012 1157   HCT 34.4* 12/04/2014 2144   HCT 36.6 10/10/2014 1949   HCT 38.9 10/02/2014 1421   MCV 83.3 12/04/2014 2144   MCV 78.9 10/10/2014 1949   MCV 78.4 10/02/2014 1421    Lipid Panel     Component Value Date/Time   CHOL 133 02/10/2014 1656   TRIG 245* 02/10/2014 1656   HDL 36* 02/10/2014 1656   CHOLHDL 3.7 02/10/2014 1656   VLDL 49* 02/10/2014 1656   LDLCALC 48 02/10/2014 1656    ABG    Component Value Date/Time   PHART 7.385 04/23/2012 0958   PCO2ART 36.8 04/23/2012 0958   PO2ART 88.0 04/23/2012 0958   HCO3 21.6 04/23/2012 0958   TCO2 22 01/08/2014 1443   ACIDBASEDEF 2.7* 04/23/2012 0958   O2SAT 97.0 04/23/2012 0958     Lab Results  Component Value Date   TSH 1.464 10/04/2014   BNP (last 3 results)  Recent Labs  05/20/14 1452 06/10/14 1800 10/02/14 1421  BNP 59.5 34.0 9.8    ProBNP (last 3 results) No results for input(s): PROBNP in the last 8760 hours.  Cardiac Panel (last 3 results) No results for input(s): CKTOTAL, CKMB, TROPONINI, RELINDX in the last 72 hours.  Iron/TIBC/Ferritin/ %Sat    Component Value Date/Time   IRON 23* 04/30/2014 1557   TIBC 354 04/30/2014 1557   FERRITIN 13 04/30/2014 1557   IRONPCTSAT 6* 04/30/2014 1557  EKG Orders placed or performed during the hospital encounter of 12/04/14  . ED EKG  . ED EKG  . EKG 12-Lead  . EKG 12-Lead  . EKG   *Note: Due to a large number of results and/or encounters for the requested time period, some results have not been displayed. A complete set of results can be found in Results Review.      Prior Assessment and Plan Problem List as of 12/09/2014      Cardiovascular and Mediastinum   Essential hypertension   Last Assessment & Plan 09/15/2014 Office Visit Written 09/17/2014  1:30 PM by Francesca Oman, DO    BP Readings from Last 3 Encounters:  09/15/14 118/77  09/08/14 128/72  09/01/14 166/96    Lab Results  Component Value Date   NA 139 08/12/2014   K 4.0 08/12/2014   CREATININE 0.87 08/12/2014    Assessment: Blood pressure control:  well controlled Progress toward BP goal:   at goal Comments: Compliant with medications.  Plan: Medications:  continue current medications:  Lisinopril 40mg  daily, metoprolol 100mg  BID, Lasix 20mg  daily Educational resources provided: brochure (denies.) Other plans:  She is working on diet and exercise.  She plans to start vegetarian diet but still plans to eat fish.  We discussed diet and adding light exercise.        Arteriosclerotic cardiovascular disease (ASCVD)   Last Assessment & Plan 07/21/2012 Office Visit Written 07/21/2012  3:08 PM by Lendon Colonel, NP    Most recent echo revealed normal systolic function. This was completed in January 2014. She continues to have chest pressure which I believe is related to her lung disease. However she is continuing to have palpitations and mild dyspnea on exertion and is requesting a repeat stress test. We will order this and have this completed within the next week. She has multiple cardiovascular risk factors and has continued to smoke despite health warnings. We will make further recommendations with stress test has been completed.      Diastolic heart failure   Last Assessment & Plan 09/30/2013 Office Visit Written 10/01/2013 12:49 AM by Juluis Mire, MD    Assessment: Pt with grade 2 diastolic dysfunction with last 2D-echo on 10/20/12 recently restarted on diuretic therapy who presents with euvolemia.    Plan:  -Continue furosemide 20 mg daily  -Continue to monitor volume status        Pulmonary hypertension   Last Assessment & Plan 10/27/2012 Office Visit Written 10/27/2012  4:50 PM by Rigoberto Noel, MD    RVSP decreased to 37 in 09/2012 -recorded as 57 during acute illness in jan      IIH (idiopathic intracranial hypertension)     Respiratory   COPD (chronic obstructive pulmonary disease)   Last Assessment & Plan 02/06/2013 Office Visit Written 02/06/2013  5:02 PM by Rigoberto Noel, MD    Smoking cessation and lifestyle changes remain primary interventions here and this was emphasized during visit Further followup can be with PCP We discussed signs of COPD flare and she can call us should this occur.      Sinusitis   Last Assessment & Plan 07/26/2014 Office Visit Written 07/26/2014  6:21 PM by Francesca Oman, DO    She continues to have sinus discomfort but is afebrile, non-toxic appearing and there is no purulent rhinorrhea.  The nasal mucose is red and edematous but not obstructed.  There is no periorbital swelling, vision changes or necrotic tissue on exam  to suggest mucor.  She has been prescribed three different antibiotics (by different providers) since symptoms began 1 month ago but she has not finished any of them due to worsening N/V when she takes the abx.   - continue Claritin and Nasonex - Doxy was prescribed by UC doctor 3 days ago, I advised she try and take this medication with food - CT maxillofacial/sinus - she is scheduled to return to ENT next week - return to clinic prn or for worsening symptoms      Seasonal allergies   Last Assessment & Plan 02/22/2014 Office Visit Written 02/23/2014  1:17 PM by Cresenciano Genre, MD    Rx Nasonex and Zyrtec       Viral respiratory illness   Last Assessment & Plan 08/12/2014 Office Visit Written 08/12/2014  2:52 PM by Milagros Loll, MD    Assessment: Cough likely 2/2 acute viral upper respiratory infection. She has not had any fevers, chills. Lung exam clear.  Plan: -Supportive treatment -guaiFENesin-codeine  (ROBITUSSIN AC) 100-10 MG/5ML syrup TID prn cough -Instructed patient to return to clinic if fevers or worsening shortness of breath      Sinusitis, acute   Last Assessment & Plan 05/24/2014 Office Visit Written 05/24/2014  3:27 PM by Clinton Gallant, MD    Patient seems to have classic findings of sinusitis and ongoing symptoms for at least 2-3 months. Has not had any other antibiotic treatment up until this point. -Patient has penicillin allergy therefore will give levofloxacin 500 mg daily for 5 days -ENT referral        Digestive   GERD (gastroesophageal reflux disease)   Last Assessment & Plan 05/05/2014 Office Visit Written 05/06/2014  8:38 AM by Julious Oka, MD    Refilled Nexium for GERD, has no complaints today regarding acid reflux.       Viral gastroenteritis   Last Assessment & Plan 05/05/2014 Office Visit Written 05/06/2014  8:32 AM by Julious Oka, MD    Pt was extremely lethargic during exam, pt's name had to be called to wake pt up during med rec. Orthostatic vitals today were negative (150/86 pulse, 151/87 standing). Pt's sx are improving. She is tolerating po liquids and food. She is having less BMs. Informed pt to return to clinic if her sx do not get better within 7-10 days. Instructed to cut down on phernergan and zofran if she feels more drowsy than usual. However, pt likely lethargic due to poor sleep as she only took phenergan once day before clinic visit and zofran once morning of visit. She is also on geodon which she gets from mental health.       Nausea with vomiting   Last Assessment & Plan 07/26/2014 Office Visit Edited 07/26/2014  6:30 PM by Francesca Oman, DO    Chronic N/V x several months.  She has been on Fe and metformin for a long time and does not feel symptoms are related to these meds.  She feels the N/V is more frequent when she takes the abx.  Abdominal exam unremarkable and recent labs unrevealing.  She reports early satiety and has peripheral neuropathy so  gastroparesis due to DM is a possible cause of her N/V (though A1c is only 7.6). - gastric emptying study ordered to evaluate for gastroparesis - continue Nexium for GERD        Endocrine   Diabetes mellitus type II, controlled   Last Assessment & Plan 10/22/2014 Office Visit Edited 10/24/2014  2:34  AM by Francesca Oman, DO    She would like to begin fasting for religious purposes.  She plans to fast for one day at a time and not several days in a row.  She would like to know how to take her DM medications on days that she is fasting.   - I advised she take half of her Lantus (9 units) the evening prior to the fast, skip her metformin on the day of the fast, resume full dose Lantus after meal (HS) on the evening she begins to eat again.         Nervous and Auditory   Pulsatile tinnitus     Other   Hyperlipidemia   Last Assessment & Plan 05/05/2014 Office Visit Written 05/06/2014  8:36 AM by Julious Oka, MD    LDL 48 in 01/2014. Given refill of statin.       Obesity   Last Assessment & Plan 01/19/2014 Office Visit Written 01/19/2014  6:15 PM by Jessee Avers, MD    Discussed weight loss with her as this may help with hypertension.      TOBACCO ABUSE   Last Assessment & Plan 09/15/2014 Office Visit Edited 09/17/2014  1:31 PM by Francesca Oman, DO     Assessment: Progress toward smoking cessation:   unchanged Barriers to progress toward smoking cessation:   addiction Comments: not ready to quit  Plan: Instruction/counseling given:  I counseled patient on the dangers of tobacco use, advised patient to stop smoking, and reviewed strategies to maximize success. Educational resources provided:  [employer] account [employer]) brochure (denies)         Bipolar 1 disorder   Last Assessment & Plan 09/15/2014 Office Visit Written 09/17/2014  1:38 PM by Francesca Oman, DO    She reports stable mood, denies depression or mania symptoms.  She is compliant with Geodon and goes to [facility] for  therapy/medications.      IDA (iron deficiency anemia)   Last Assessment & Plan 05/05/2014 Office Visit Written 05/06/2014  8:34 AM by Julious Oka, MD    Pt is non compliant w/ iron supplementation. Has difficulty remember to take it TID. Denies constipation. Given refill of ferrous sulfate 325mg  TID. Instructed to take colace if develops constipation from med. Recheck CBC at next visit in one month.       Preventative health care   Last Assessment & Plan 12/24/2013 Office Visit Written 12/25/2013  7:18 PM by Juluis Mire, MD    Pt declined annual influenza vaccination today.       Daytime hypersomnolence   Last Assessment & Plan 09/15/2014 Office Visit Edited 09/17/2014  1:37 PM by Francesca Oman, DO    No significant OSA on recent sleep study.  Neuro has evaluated her and recs for sleeping off back, weight loss and good sleep hygiene.  She still has issues with insomnia.  She says psych recently changed Geodon from BID to two pills nightly because she was so somnolent in the day.  She has been on this regimen for two weeks so will give it some more time to take effect.  We also discussed sleep hygiene.       Diarrhea   Last Assessment & Plan 10/04/2014 Office Visit Edited 10/04/2014  3:56 PM by Dellia Nims, MD    Diarrhea could be 2/2 to her detox tea, recent dietary change (avoiding all meats and only eating vegetarian diet), also viral gastroenteritis.  Asked to stop the detox tea. Treat  symptomatically with PO hydration (with caution as she has CHF).   Checked orthostat (normal).   Asked her to not take the lasix while she is having diarrhea. She hasn't been taking it since Friday. Check BMET to check electrolytes.      Leg swelling   Last Assessment & Plan 10/21/2013 Office Visit Written 10/21/2013  4:43 PM by Julious Oka, MD    Pt with 1+ pitting edema b/l. Well's score of 1, thus not concerned for DVT. Pt notes that swelling is going down. Thus insect bites could be source of leg  swelling or possibly insufficient dose of lasix being giving. Will f/u in clinic in 4 weeks after diarrhea and insect bites fully resolve to recheck weight and readdress CHF meds.       Dyspepsia   Last Assessment & Plan 01/25/2014 Office Visit Edited 01/25/2014  9:54 AM by Mahala Menghini, PA-C    49 year old lady with history of recent recurrent nausea/vomiting, early satiety in the setting of NSAID use. EGD showed no evidence of persistent ulcer. Really nothing to explain her ongoing symptoms. Patient has had significant improvement of her nausea and abdominal pain, feels much better. Currently on Nexium 40 mg twice daily, Carafate, Zofran as needed. Looking through her record she actually has a history of iron deficiency anemia. Somewhat heavy menses. Heme negative a couple months ago. Most recent colonoscopy 2013. IDA may be related to her menstrual cycle but need to consider GI etiology. Patient complains of bowel habit change the last couple of months, question etiology.  Initially we'll start with rechecking iron, ferritin, CBC. IFOBT. If she has recurrent nausea and vomiting, abdominal pain, consider abdominal ultrasound to check gallbladder. Further recommendations to follow.  Loose stool not addressed today, we'll touch base with patient once labs have returned.      Headache   Last Assessment & Plan 02/22/2014 Office Visit Written 02/23/2014  1:15 PM by Cresenciano Genre, MD    Likely related to uncontrolled HTN, could be also sinus inflammation though no active sinsusitis Changed BP meds increased to control BP Can take prn Tylenol or Advil for h/a Will try restarting Nasonex and zyrtec for allergies.        Hypokalemia   Last Assessment & Plan 01/01/2014 Office Visit Written 01/01/2014  4:31 PM by Dellia Nims, MD    Had mild hypokalemia of 3.4 last visit. Repeat BMP today showed K 3.6. Is taking K+ tablet. Likely 2/2 to lasix use. Continue KCl tablet 45mEQ.       Flank pain, acute    Last Assessment & Plan 01/01/2014 Office Visit Written 01/01/2014  4:15 PM by Dellia Nims, MD    Has aching left flank pain starting yesterdar. Has no diarrhea, fever/chills, dysuria. Having regular BM. Does have some distension/feels like has lot of gas. Did do lot of house work recently.  Could be MSK pain or 2/2 to gas/distension. UTI less likely given no fever/dysuria. Ovarian less likely given location of the pain and doesn't appear to be as severe of a pain as one would expect from ovarian torsion.   Simethicone, tylenol for pain, Voltaren gel. instructed her to go to ED or come back to clinic if symptoms worsen or develops fever or other symptoms.       Positional headache   Vision changes   Left knee pain   Last Assessment & Plan 09/15/2014 Office Visit Written 09/17/2014  1:37 PM by Francesca Oman, DO  Symptoms improving since I last saw her.   No swelling.       Fatigue   Last Assessment & Plan 10/04/2014 Office Visit Written 10/04/2014  3:53 PM by Dellia Nims, MD    Unclear etiology. Started 3 days ago, in the setting of recent diet (stopped eating all red meats, also stopped smoking at the same time, and had limited carolie intake of 1200-1500). during this time also was drinking some "detox" tea. Started having diarrhea 3 days ago, nonbloody. No recent infection, no fever, no travel, no sick contacts.   Fatigue could be 2/2 to just being on a new diet and overdoing it. Also could be 2/2 to diarrhea. Also states easily feeling hot, nervous so worrisome for thyroid etiology too. 1 year ago B12 was borderline low, had iron def anemia but hgb checked in ED was normal.  - check B12, MMA level( b/c b12 was borderline low last time), check TSH, BMET (to make sure electrolytes are fine with diarrhea).  Asked her to drink water (not gatorade as she is diabetic), upto 1.8 L daily. Asked her to come back if diarrhea doesn't improve in 2-3 days or if she develops a fever.   Asked her to avoid  the detox tea in the future. She is interested in B12 shots if her b12 level is low, she mentioned her sister takes it.       Encounter for health education   Last Assessment & Plan 10/22/2014 Office Visit Edited 10/24/2014  1:56 AM by Francesca Oman, DO    The patient left message with [facility] two weeks ago upset because she was informed schizoaffective disorder was in her hx when she was seen in the ED.  This diagnosis is listed in the history but current diagnosis of Bipolar disorder is on active problem list.  Review of EPIC revealed this dx during 2012 [facility] admission.  There are previous [facility] admission dating back to 2000 (pre-EPIC) and I do not have access to the documents associated with the admissions. Today's visit was made in order to discuss this face to face with the patient.  She was pleasant during the visit and said she was no longer upset about this.  I explained that mental health diagnosis like physical diagnosis may change over time if symptoms change and new information is obtained.  I explained that at the time she was diagnosed she likely fit criteria for that disorder but subsequent evaluations may have been more c/w bipolar.  I explained that I could not remove past provider's dx from the hx because that is what the assessment was at the time.  She was happy with the above explanation.  She is compliant with medications and therapy and feels her mood is stable.   She denies SI.  Will attempt to obtain record from [facility] so all dx are appropriately documented in EPIC.  Patient says she also was not aware of COPD dx so I reviewed note from Pulm visit indicating mild COPD and re-iterated importance of smoking cessation.  She was satisfied with the visit.  She will return for routine follow-up in two months.           Imaging: Dg Chest 2 View  11/26/2014   CLINICAL DATA:  Cough, fever.  EXAM: CHEST  2 VIEW  COMPARISON:  October 02, 2014.  FINDINGS: The heart size and mediastinal contours are  within normal limits. Both lungs are clear. No pneumothorax or pleural effusion is noted.  The visualized skeletal structures are unremarkable.  IMPRESSION: No active cardiopulmonary disease.   Electronically Signed   By: Marijo Conception, M.D.   On: 11/26/2014 14:17   Dg Chest Port 1 View  12/04/2014   CLINICAL DATA:  Acute onset of dry constant cough. Initial encounter.  EXAM: PORTABLE CHEST - 1 VIEW  COMPARISON:  Chest radiograph performed 11/26/2014  FINDINGS: The lungs are well-aerated and clear. There is no evidence of focal opacification, pleural effusion or pneumothorax.  The cardiomediastinal silhouette is within normal limits. No acute osseous abnormalities are seen.  IMPRESSION: No acute cardiopulmonary process seen.   Electronically Signed   By: Garald Balding M.D.   On: 12/04/2014 23:18

## 2014-12-09 NOTE — Progress Notes (Signed)
Cardiology Office Note   Date:  12/09/2014   ID:  [name], DOB 01-Mar-1966, MRN [id number]  PCP:  Duwaine Maxin, DO  Cardiologist:  Ross/ Jory Sims, NP   Chief Complaint  Patient presents with  . Coronary Artery Disease  . Congestive Heart Failure    Diastolic       History of Present Illness: [name] is a 49 y.o. female who presents for assessment and management of hypertension, diastolic dysfunction, minimal CAD, with hx of anemia, diabetes,  ETOH abuse, depression, with complaints of of dizziness and fatigue. Last NM MPI 09/16/2013 demonstrating Normal Lexiscan nuclear stress test. No scar or ischemia. Normal LVEF 63%. She was seen in the ER on 12/05/2014 with complains of back pain and abdominal pain. Symptoms improved with Ativan as it was felt anxiety and exacerbation of Bipolar disease was cause of symptoms.   Last Echo 67/10/2012 Left ventricle: The cavity size was normal. Wall thickness was increased in a pattern of mild LVH. Systolic function was normal. The estimated ejection fraction was in the range of 55% to 60%. Wall motion was normal; there were no regional wall motion abnormalities. - Pulmonary arteries: PA peak pressure: 98mm Hg (S).  Most recent labs on 12/04/2014 Hgb: 11.3, Hct: 34.4, Na 134, K+4.1.   She states that she figured out the cause of her symptoms as she had not been taking her lasix. She did not know she ran out as she was taking her pills out of a pill box and had forgotten that she did not have the lasix to put in to take daily. She has lost 10 lbs since being seen in the ER and feels much better.   Past Medical History  Diagnosis Date  . Arteriosclerotic cardiovascular disease (ASCVD)     Minimal at cath in [facility].stress nuclear study in 8/08 with nl EF; neg stress echo in 2010  . Diabetes mellitus, type 2 2000    Onset in 2000; no insulin  . Hyperlipidemia   . Hypertension `    during treatment with  Geodon  . Gastroesophageal reflux disease     Schatzki's ring  . Anemia, iron deficiency   . Alcohol abuse   . Depression   . Community acquired pneumonia 01/03/10, 05/2010, 04/2012    2011; with pleural effusion-[facility] acute resp failure; intubated in Jan 2014 (HMPV pneumonia)  . Obesity   . Schizoaffective disorder     requiring multiple psychiatric admissions  . Dysphagia   . Diastolic dysfunction     grade 2 per echo 2011  . Pulmonary hypertension 05/02/2012    Patient needs repeat echo in 06/2012   . History of alcohol abuse 07/22/2007    Qualifier: Diagnosis of  By: Lenn Cal    . PTSD (post-traumatic stress disorder)     Past Surgical History  Procedure Laterality Date  . Dilation and curettage, diagnostic / therapeutic  1992  . Esophagogastroduodenoscopy  09/16/08    Dr. Trevor Iha hiatal hernia/excoriations involving the cardia and mucosa consistent with trauma, antral erosions  of linear petechiae ? gastritis versus early gastric antral vascular  ectasia.Marland Kitchen biopsy showed reactive gastropathy. No H. pylori.  . Esophagogastroduodenoscopy  09/2007    Dr. Evalee Mutton ring, dilated to 18 French Maloney dilator, small hiatal hernia, antral erosions, biopsies reactive gastropathy.  Azzie Almas dilation  07/17/2011    [facility] sedation-->distal esophageal stricture s/p dilation, chronic gastritis, multiple ulcers in stomach. no h.pylori  . Colonoscopy  01/2006  internal hemorrhoids  . Colonoscopy  01/10/2012    Dr. Rourk:Single anal canal hemorrhoidal tag likely source of  trivial hematochezia; right-sided colonic diverticulosis  . Esophagogastroduodenoscopy (egd) with propofol N/A 12/17/2013    CHY:IFOYDXA antral erosions and petechiae. Small hiatal hernia. No endoscopic explanation for patient's symptoms     Current Outpatient Prescriptions  Medication Sig Dispense Refill  . ACCU-CHEK FASTCLIX LANCETS MISC Use to check blood sugar 3 to 4 times daily. diag code E11.9.  Insulin dependent 204 each 11  . acetaminophen (TYLENOL) 500 MG tablet Take 1,000 mg by mouth every 6 (six) hours as needed for mild pain.    Marland Kitchen albuterol (PROVENTIL HFA;VENTOLIN HFA) 108 (90 BASE) MCG/ACT inhaler Inhale 2 puffs into the lungs 3 (three) times daily as needed for wheezing or shortness of breath. 3 Inhaler 0  . albuterol (PROVENTIL) (2.5 MG/3ML) 0.083% nebulizer solution Take 3 mLs (2.5 mg total) by nebulization every 6 (six) hours as needed for wheezing or shortness of breath. 90 mL 3  . esomeprazole (NEXIUM) 40 MG capsule TAKE ONE CAPSULE BY MOUTH TWICE DAILY BEFORE A MEAL. 60 capsule 0  . furosemide (LASIX) 20 MG tablet Take 1 tablet (20 mg total) by mouth daily. 30 tablet 6  . glucose blood (ACCU-CHEK SMARTVIEW) test strip Use to check blood sugar 3 to 4 times daily. diag code E11.9. Insulin dependent. 120 each 11  . hydrochlorothiazide (HYDRODIURIL) 12.5 MG tablet Take 12.5 mg by mouth daily.    Marland Kitchen HYDROcodone-acetaminophen (NORCO/VICODIN) 5-325 MG per tablet Take by mouth.    . hydrOXYzine (ATARAX/VISTARIL) 10 MG tablet Take 10 mg by mouth daily.    Marland Kitchen ibuprofen (ADVIL,MOTRIN) 200 MG tablet Take 400 mg by mouth every 6 (six) hours as needed for mild pain.    . Insulin Glargine (LANTUS SOLOSTAR) 100 UNIT/ML Solostar Pen Inject 18 Units into the skin at bedtime. (Patient taking differently: Inject 22 Units into the skin at bedtime. ) 15 mL 1  . lisinopril (PRINIVIL,ZESTRIL) 40 MG tablet Take 1 tablet (40 mg total) by mouth daily. 90 tablet 1  . loperamide (IMODIUM A-D) 2 MG tablet Take 1 tablet (2 mg total) by mouth 4 (four) times daily as needed for diarrhea or loose stools. 30 tablet 0  . loratadine (CLARITIN) 10 MG tablet Take 1 tablet (10 mg total) by mouth daily. 30 tablet 3  . LORazepam (ATIVAN) 1 MG tablet Take one tablet every 12 hours if needed for burning sensation 15 tablet 0  . lovastatin (MEVACOR) 20 MG tablet Take 1 tablet (20 mg total) by mouth at bedtime. 90 tablet 3   . metFORMIN (GLUCOPHAGE) 1000 MG tablet TAKE (1) TABLET BY MOUTH TWICE DAILY WITH MEALS. 60 tablet 11  . metoprolol (LOPRESSOR) 100 MG tablet Take 1 tablet (100 mg total) by mouth 2 (two) times daily. 180 tablet 1  . mometasone (NASONEX) 50 MCG/ACT nasal spray Place 2 sprays into the nose 2 (two) times daily. 17 g 12  . nicotine (NICODERM CQ - DOSED IN MG/24 HOURS) 21 mg/24hr patch APPLY 1 PATCH TO THE SKIN DAILY AS DIRECTED. 28 patch 0  . NOVOFINE 32G X 6 MM MISC     . potassium chloride SA (K-DUR,KLOR-CON) 20 MEQ tablet Take 20 mEq by mouth daily.    Marland Kitchen topiramate (TOPAMAX) 50 MG tablet Take 1 tablet (50 mg total) by mouth 2 (two) times daily. 60 tablet 11  . nitroGLYCERIN (NITROSTAT) 0.3 MG SL tablet Place 1 tablet (0.3 mg total) under  the tongue every 5 (five) minutes as needed for chest pain. 25 tablet 3  . [DISCONTINUED] ziprasidone (GEODON) 80 MG capsule Take 1 capsule (80 mg total) by mouth 2 (two) times daily with a meal. (Patient taking differently: Take 160 mg by mouth at bedtime. ) 60 capsule 0   No current facility-administered medications for this visit.   Facility-Administered Medications Ordered in Other Visits  Medication Dose Route Frequency Provider Last Rate Last Dose  . 0.9 %  sodium chloride infusion   Intravenous Once Merryl Hacker, MD      . ondansetron [facility]) injection 4 mg  4 mg Intramuscular Once Merryl Hacker, MD      . pantoprazole (PROTONIX) injection 40 mg  40 mg Intravenous Once Merryl Hacker, MD        Allergies:   Cephalexin; Metronidazole; Orange; Shrimp; Penicillins; Sulfonamide derivatives; Glipizide; and Sulfamethoxazole-trimethoprim    Social History:  The patient  reports that she has been smoking Cigarettes.  She started smoking about 2 years ago. She has a 30 pack-year smoking history. She has never used smokeless tobacco. She reports that she does not drink alcohol or use illicit drugs.   Family History:  The patient's family history  includes Arthritis in an other family member; Colon cancer in her other; Diabetes in an other family member; Heart disease in her sister; High Cholesterol in an other family member; Hypertension in her mother; Stroke in her father. There is no history of Anesthesia problems, Hypotension, Malignant hyperthermia, or Pseudochol deficiency.    ROS: .   All other systems are reviewed and negative.Unless otherwise mentioned in H&P above.   PHYSICAL EXAM: VS:  BP 122/80 mmHg  Pulse 92  Ht 5\' 4"  (1.626 m)  Wt 201 lb (91.173 kg)  BMI 34.48 kg/m2  SpO2 95%  LMP 12/04/2014 (Exact Date) , BMI Body mass index is 34.48 kg/(m^2). GEN: Well nourished, well developed, in no acute distress HEENT: normal Neck: no JVD, carotid bruits, or masses Cardiac:RRR; no murmurs, rubs, or gallops,no edema  Respiratory:  clear to auscultation bilaterally, normal work of breathing GI: soft, nontender, nondistended, + BS MS: no deformity or atrophy Skin: warm and dry, no rash Neuro:  Strength and sensation are intact Psych: euthymic mood, full affect   Recent Labs: 10/02/2014: B Natriuretic Peptide 9.8 10/04/2014: TSH 1.464 12/04/2014: ALT 12*; BUN 9; Creatinine, Ser 1.03*; Hemoglobin 11.3*; Platelets 291; Potassium 4.1; Sodium 134*    Lipid Panel    Component Value Date/Time   CHOL 133 02/10/2014 1656   TRIG 245* 02/10/2014 1656   HDL 36* 02/10/2014 1656   CHOLHDL 3.7 02/10/2014 1656   VLDL 49* 02/10/2014 1656   LDLCALC 48 02/10/2014 1656      Wt Readings from Last 3 Encounters:  12/09/14 201 lb (91.173 kg)  12/04/14 208 lb 2 oz (94.405 kg)  11/25/14 212 lb (96.163 kg)      Other studies Reviewed: Additional studies/ records that were reviewed today include: None Review of the above records demonstrates: N/A  ASSESSMENT AND PLAN:  1. Chronic Diastolic CHF: Feels better since she started back taking her lasix as prescribed. She has lost 10 lbs. I will not make any changes at this time. She is  reminded to be medically complaint and avoid salt.   2. Chronic DOE: Related to tobacco abuse and COPD  3.Anxiety and Depression with BiPolar: She is to follow up with psychiatry to continue assistance with this .  Current medicines are reviewed at length with the patient today.    Labs/ tests ordered today include: None No orders of the defined types were placed in this encounter.     Disposition:   FU with 6 months.  Jory Sims, NP  12/09/2014 5:20 PM    [facility] [address] [place], [place] [address] Phone: [phone]; Fax: [phone]

## 2014-12-09 NOTE — Patient Instructions (Signed)
Your physician wants you to follow-up in: 6 months with Kathryn Lawrence, NP. You will receive a reminder letter in the mail two months in advance. If you don't receive a letter, please call our office to schedule the follow-up appointment.  Your physician recommends that you continue on your current medications as directed. Please refer to the Current Medication list given to you today.  Thank you for choosing [facility]!   

## 2014-12-10 ENCOUNTER — Telehealth: Payer: Self-pay | Admitting: Internal Medicine

## 2014-12-10 NOTE — Telephone Encounter (Signed)
  INTERNAL MEDICINE RESIDENCY PROGRAM After-Hours Telephone Call    Reason for call:   I received a call from Ms. [name] at 8:45 PM, 12/10/2014 indicating that she is having weight loss and thinks she is having palpitations.    Pertinent Data:   Patient was seen by NP at cardiology clinic yesterday, suspected weight loss was 2/2 improved compliance w/ Lasix over the past few days.   Endorses some mild lightheadedness and dizziness. Denies chest pain and SOB.   Thinks she is hypothyroid. TSH in 09/2014 wnl.     Assessment / Plan / Recommendations:   Will send message to clinic to have patient scheduled for acute visit early in the week.  As always, pt is advised that if symptoms worsen or new symptoms arise, they should go to an urgent care facility or to to ER for further evaluation.    Corky Sox, MD   12/10/2014, 8:45 PM

## 2014-12-12 ENCOUNTER — Encounter (HOSPITAL_COMMUNITY): Payer: Self-pay | Admitting: Emergency Medicine

## 2014-12-12 ENCOUNTER — Emergency Department (INDEPENDENT_AMBULATORY_CARE_PROVIDER_SITE_OTHER)
Admission: EM | Admit: 2014-12-12 | Discharge: 2014-12-12 | Disposition: A | Discharge status: Home / Self Care | Payer: Medicare Other | Source: Home / Self Care

## 2014-12-12 DIAGNOSIS — R739 Hyperglycemia, unspecified: Secondary | ICD-10-CM

## 2014-12-12 DIAGNOSIS — R208 Other disturbances of skin sensation: Secondary | ICD-10-CM

## 2014-12-12 LAB — POCT I-STAT, CHEM 8
BUN: 7 mg/dL (ref 6–20)
CALCIUM ION: 1.22 mmol/L (ref 1.12–1.23)
Chloride: 101 mmol/L (ref 101–111)
Creatinine, Ser: 0.8 mg/dL (ref 0.44–1.00)
Glucose, Bld: 251 mg/dL — ABNORMAL HIGH (ref 65–99)
HEMATOCRIT: 44 % (ref 36.0–46.0)
Hemoglobin: 15 g/dL (ref 12.0–15.0)
Potassium: 4 mmol/L (ref 3.5–5.1)
SODIUM: 136 mmol/L (ref 135–145)
TCO2: 21 mmol/L (ref 0–100)

## 2014-12-12 LAB — POCT URINALYSIS DIP (DEVICE)
Bilirubin Urine: NEGATIVE
GLUCOSE, UA: NEGATIVE mg/dL
Hgb urine dipstick: NEGATIVE
KETONES UR: NEGATIVE mg/dL
Leukocytes, UA: NEGATIVE
NITRITE: NEGATIVE
PROTEIN: NEGATIVE mg/dL
Specific Gravity, Urine: >=1.03 (ref 1.005–1.030)
Urobilinogen, UA: 0.2 mg/dL (ref 0.0–1.0)
pH: 5.5 (ref 5.0–8.0)

## 2014-12-12 NOTE — ED Provider Notes (Signed)
CSN: [id number]     Arrival date & time 12/12/14  1301 History   None    Chief Complaint  Patient presents with  . Dizziness  . Weight Loss   (Consider location/radiation/quality/duration/timing/severity/associated sxs/prior Treatment) Patient is a 49 y.o. female presenting with dizziness. The history is provided by the patient.  Dizziness Quality:  Head spinning and lightheadedness Severity:  Mild Onset quality:  Unable to specify Duration:  2 days Timing:  Constant Progression:  Unchanged Chronicity:  New Relieved by:  Nothing   Past Medical History  Diagnosis Date  . Arteriosclerotic cardiovascular disease (ASCVD)     Minimal at cath in [facility].stress nuclear study in 8/08 with nl EF; neg stress echo in 2010  . Diabetes mellitus, type 2 2000    Onset in 2000; no insulin  . Hyperlipidemia   . Hypertension `    during treatment with Geodon  . Gastroesophageal reflux disease     Schatzki's ring  . Anemia, iron deficiency   . Alcohol abuse   . Depression   . Community acquired pneumonia 01/03/10, 05/2010, 04/2012    2011; with pleural effusion-[facility] acute resp failure; intubated in Jan 2014 (HMPV pneumonia)  . Obesity   . Schizoaffective disorder     requiring multiple psychiatric admissions  . Dysphagia   . Diastolic dysfunction     grade 2 per echo 2011  . Pulmonary hypertension 05/02/2012    Patient needs repeat echo in 06/2012   . History of alcohol abuse 07/22/2007    Qualifier: Diagnosis of  By: Lenn Cal    . PTSD (post-traumatic stress disorder)    Past Surgical History  Procedure Laterality Date  . Dilation and curettage, diagnostic / therapeutic  1992  . Esophagogastroduodenoscopy  09/16/08    Dr. Trevor Iha hiatal hernia/excoriations involving the cardia and mucosa consistent with trauma, antral erosions  of linear petechiae ? gastritis versus early gastric antral vascular  ectasia.Marland Kitchen biopsy showed reactive gastropathy. No H. pylori.  .  Esophagogastroduodenoscopy  09/2007    Dr. Evalee Mutton ring, dilated to 47 French Maloney dilator, small hiatal hernia, antral erosions, biopsies reactive gastropathy.  Azzie Almas dilation  07/17/2011    [facility] sedation-->distal esophageal stricture s/p dilation, chronic gastritis, multiple ulcers in stomach. no h.pylori  . Colonoscopy  01/2006    internal hemorrhoids  . Colonoscopy  01/10/2012    Dr. Rourk:Single anal canal hemorrhoidal tag likely source of  trivial hematochezia; right-sided colonic diverticulosis  . Esophagogastroduodenoscopy (egd) with propofol N/A 12/17/2013    DPO:EUMPNTI antral erosions and petechiae. Small hiatal hernia. No endoscopic explanation for patient's symptoms   Family History  Problem Relation Age of Onset  . Colon cancer Other   . Hypertension Mother   . Stroke Father     deceased at age 52  . Heart disease Sister   . Anesthesia problems Neg Hx   . Hypotension Neg Hx   . Malignant hyperthermia Neg Hx   . Pseudochol deficiency Neg Hx   . Diabetes    . High Cholesterol    . Arthritis     Social History  Substance Use Topics  . Smoking status: Current Every Day Smoker -- 1.00 packs/day for 30 years    Types: Cigarettes    Start date: 05/18/2012  . Smokeless tobacco: Never Used     Comment: cutting back,  . Alcohol Use: No     Comment: hx of ETOH abuse   OB History    Gravida Para  Term Preterm AB TAB SAB Ectopic Multiple Living   3 2 2  1 1    2      Review of Systems  Constitutional: Positive for fatigue and unexpected weight change.  HENT: Negative.   Eyes: Negative.   Respiratory: Negative.   Cardiovascular: Negative.   Genitourinary: Positive for frequency.  Neurological: Positive for dizziness and light-headedness.    Allergies  Cephalexin; Metronidazole; Orange; Shrimp; Penicillins; Sulfonamide derivatives; Glipizide; and Sulfamethoxazole-trimethoprim  Home Medications   Prior to Admission medications   Medication Sig Start  Date End Date Taking? Authorizing Provider  hydrochlorothiazide (HYDRODIURIL) 12.5 MG tablet Take 12.5 mg by mouth daily. 08/12/14  Yes Historical Provider, MD  Insulin Glargine (LANTUS SOLOSTAR) 100 UNIT/ML Solostar Pen Inject 18 Units into the skin at bedtime. Patient taking differently: Inject 22 Units into the skin at bedtime.  09/15/14  Yes Alex Ronnie Derby, DO  lisinopril (PRINIVIL,ZESTRIL) 40 MG tablet Take 1 tablet (40 mg total) by mouth daily. 10/20/14  Yes Francesca Oman, DO  lovastatin (MEVACOR) 20 MG tablet Take 1 tablet (20 mg total) by mouth at bedtime. 05/05/14  Yes Norman Herrlich, MD  metFORMIN (GLUCOPHAGE) 1000 MG tablet TAKE (1) TABLET BY MOUTH TWICE DAILY WITH MEALS. 11/01/14  Yes Francesca Oman, DO  metoprolol (LOPRESSOR) 100 MG tablet Take 1 tablet (100 mg total) by mouth 2 (two) times daily. 10/20/14  Yes Francesca Oman, DO  potassium chloride SA (K-DUR,KLOR-CON) 20 MEQ tablet Take 20 mEq by mouth daily.   Yes Historical Provider, MD  ACCU-CHEK FASTCLIX LANCETS MISC Use to check blood sugar 3 to 4 times daily. diag code E11.9. Insulin dependent 09/20/14   Bartholomew Crews, MD  acetaminophen (TYLENOL) 500 MG tablet Take 1,000 mg by mouth every 6 (six) hours as needed for mild pain.    Historical Provider, MD  albuterol (PROVENTIL HFA;VENTOLIN HFA) 108 (90 BASE) MCG/ACT inhaler Inhale 2 puffs into the lungs 3 (three) times daily as needed for wheezing or shortness of breath. 12/02/14   Francesca Oman, DO  albuterol (PROVENTIL) (2.5 MG/3ML) 0.083% nebulizer solution Take 3 mLs (2.5 mg total) by nebulization every 6 (six) hours as needed for wheezing or shortness of breath. 12/02/14   Francesca Oman, DO  esomeprazole (NEXIUM) 40 MG capsule TAKE ONE CAPSULE BY MOUTH TWICE DAILY BEFORE A MEAL. 10/07/14   Sid Falcon, MD  furosemide (LASIX) 20 MG tablet Take 1 tablet (20 mg total) by mouth daily. 07/13/14   Norman Herrlich, MD  glucose blood (ACCU-CHEK SMARTVIEW) test strip Use to check blood sugar 3 to  4 times daily. diag code E11.9. Insulin dependent. 09/20/14   Bartholomew Crews, MD  HYDROcodone-acetaminophen (NORCO/VICODIN) 5-325 MG per tablet Take by mouth. 08/02/14   Historical Provider, MD  hydrOXYzine (ATARAX/VISTARIL) 10 MG tablet Take 10 mg by mouth daily. 06/22/14   Historical Provider, MD  ibuprofen (ADVIL,MOTRIN) 200 MG tablet Take 400 mg by mouth every 6 (six) hours as needed for mild pain.    Historical Provider, MD  loperamide (IMODIUM A-D) 2 MG tablet Take 1 tablet (2 mg total) by mouth 4 (four) times daily as needed for diarrhea or loose stools. 06/23/14   Norman Herrlich, MD  loratadine (CLARITIN) 10 MG tablet Take 1 tablet (10 mg total) by mouth daily. 09/15/14   Francesca Oman, DO  LORazepam (ATIVAN) 1 MG tablet Take one tablet every 12 hours if needed for burning sensation 12/05/14   Broadus John  Zammit, MD  mometasone (NASONEX) 50 MCG/ACT nasal spray Place 2 sprays into the nose 2 (two) times daily. 02/22/14   Nino Glow McLean-Scocozza, MD  nicotine (NICODERM CQ - DOSED IN MG/24 HOURS) 21 mg/24hr patch APPLY 1 PATCH TO THE SKIN DAILY AS DIRECTED. 11/03/14   Francesca Oman, DO  nitroGLYCERIN (NITROSTAT) 0.3 MG SL tablet Place 1 tablet (0.3 mg total) under the tongue every 5 (five) minutes as needed for chest pain. 12/09/14   Lendon Colonel, NP  NOVOFINE 32G X 6 MM MISC  06/22/14   Historical Provider, MD  topiramate (TOPAMAX) 50 MG tablet Take 1 tablet (50 mg total) by mouth 2 (two) times daily. 11/25/14   Melvenia Beam, MD   Meds Ordered and Administered this Visit  Medications - No data to display  BP 130/85 mmHg  Pulse 87  Temp(Src) 99.2 F (37.3 C) (Oral)  Resp 16  SpO2 100%  LMP 12/04/2014 (Exact Date) No data found.   Physical Exam  Constitutional: She is oriented to person, place, and time. She appears well-developed and well-nourished.  HENT:  Head: Normocephalic and atraumatic.  Eyes: Conjunctivae and EOM are normal. Pupils are equal, round, and reactive to light.  Neck:  Normal range of motion.  Cardiovascular: Normal rate, regular rhythm and normal heart sounds.   Pulmonary/Chest: Effort normal and breath sounds normal.  Abdominal: Soft. Bowel sounds are normal.  Neurological: She is alert and oriented to person, place, and time.  Skin: Skin is warm and dry.  Psychiatric: She has a normal mood and affect. Her behavior is normal.    ED Course  Procedures (including critical care time)  Labs Review Labs Reviewed - No data to display  Imaging Review No results found.          MDM  Hyperglycemia - Diabetes is not controlled and is probably causing your symptoms of dysthesia In arms and reassured not nicotine poisoning.  Advised to DC nicotine and to follow up with  PCP tomorrow.    Lysbeth Penner, FNP 12/12/14 1850

## 2014-12-12 NOTE — Discharge Instructions (Signed)

## 2014-12-12 NOTE — ED Notes (Signed)
Pt c/o poss "nicotine poisoning" onset 10 days Reports she has been smoking 4 cigars/day Sx include "skin burning, dizziness, light headedness, and abn wt loss"; has lost 20 lbs w/in a week Alert.. No signs of acute distress.

## 2014-12-13 ENCOUNTER — Ambulatory Visit (INDEPENDENT_AMBULATORY_CARE_PROVIDER_SITE_OTHER): Payer: Medicare Other | Admitting: Internal Medicine

## 2014-12-13 ENCOUNTER — Encounter: Payer: Self-pay | Admitting: Internal Medicine

## 2014-12-13 ENCOUNTER — Ambulatory Visit (HOSPITAL_COMMUNITY)
Admission: RE | Admit: 2014-12-13 | Discharge: 2014-12-13 | Disposition: A | Discharge status: Home / Self Care | Payer: Medicare Other | Source: Ambulatory Visit | Attending: Internal Medicine | Admitting: Internal Medicine

## 2014-12-13 VITALS — BP 139/78 | HR 88 | Temp 99.0°F | Ht 64.0 in | Wt 202.8 lb

## 2014-12-13 DIAGNOSIS — R002 Palpitations: Secondary | ICD-10-CM | POA: Diagnosis not present

## 2014-12-13 DIAGNOSIS — F17290 Nicotine dependence, other tobacco product, uncomplicated: Secondary | ICD-10-CM | POA: Diagnosis not present

## 2014-12-13 DIAGNOSIS — R208 Other disturbances of skin sensation: Secondary | ICD-10-CM | POA: Diagnosis not present

## 2014-12-13 DIAGNOSIS — E119 Type 2 diabetes mellitus without complications: Secondary | ICD-10-CM

## 2014-12-13 DIAGNOSIS — E1165 Type 2 diabetes mellitus with hyperglycemia: Secondary | ICD-10-CM | POA: Diagnosis not present

## 2014-12-13 DIAGNOSIS — R0789 Other chest pain: Secondary | ICD-10-CM

## 2014-12-13 DIAGNOSIS — R079 Chest pain, unspecified: Secondary | ICD-10-CM | POA: Insufficient documentation

## 2014-12-13 DIAGNOSIS — Z79899 Other long term (current) drug therapy: Secondary | ICD-10-CM

## 2014-12-13 DIAGNOSIS — Z794 Long term (current) use of insulin: Secondary | ICD-10-CM

## 2014-12-13 DIAGNOSIS — IMO0002 Reserved for concepts with insufficient information to code with codable children: Secondary | ICD-10-CM

## 2014-12-13 LAB — TROPONIN I

## 2014-12-13 NOTE — Patient Instructions (Addendum)
Ms. Byington is was nice meeting you today. I have ordered some labs and you will be informed when the results come back. Please remember to bring your glucose meter on your follow up visit on 12/22/14.

## 2014-12-14 ENCOUNTER — Telehealth: Payer: Self-pay | Admitting: Internal Medicine

## 2014-12-14 LAB — ANEMIA PROFILE B
BASOS ABS: 0 10*3/uL (ref 0.0–0.2)
Basos: 0 %
EOS (ABSOLUTE): 0.1 10*3/uL (ref 0.0–0.4)
Eos: 2 %
FOLATE: 15 ng/mL (ref >3.0)
Ferritin: 18 ng/mL (ref 15–150)
HEMOGLOBIN: 12 g/dL (ref 11.1–15.9)
Hematocrit: 37.7 % (ref 34.0–46.6)
IMMATURE GRANS (ABS): 0 10*3/uL (ref 0.0–0.1)
IMMATURE GRANULOCYTES: 0 %
IRON SATURATION: 31 % (ref 15–55)
Iron: 106 ug/dL (ref 27–159)
LYMPHS ABS: 3.1 10*3/uL (ref 0.7–3.1)
LYMPHS: 46 %
MCH: 26.3 pg — AB (ref 26.6–33.0)
MCHC: 31.8 g/dL (ref 31.5–35.7)
MCV: 83 fL (ref 79–97)
MONOS ABS: 0.4 10*3/uL (ref 0.1–0.9)
Monocytes: 5 %
NEUTROS PCT: 47 %
Neutrophils Absolute: 3.2 10*3/uL (ref 1.4–7.0)
PLATELETS: 257 10*3/uL (ref 150–379)
RBC: 4.56 x10E6/uL (ref 3.77–5.28)
RDW: 17 % — AB (ref 12.3–15.4)
Retic Ct Pct: 1.1 % (ref 0.6–2.6)
Total Iron Binding Capacity: 342 ug/dL (ref 250–450)
UIBC: 236 ug/dL (ref 131–425)
Vitamin B-12: 590 pg/mL (ref 211–946)
WBC: 6.8 10*3/uL (ref 3.4–10.8)

## 2014-12-14 LAB — MICROALBUMIN / CREATININE URINE RATIO: Creatinine, Urine: 35.5 mg/dL

## 2014-12-14 LAB — TSH: TSH: 0.783 u[IU]/mL (ref 0.450–4.500)

## 2014-12-14 NOTE — Telephone Encounter (Signed)
Returned call to pt.  No answer. Dr Marlowe Sax asked to call pt also.

## 2014-12-14 NOTE — Telephone Encounter (Signed)
Pt stated not feeling well, still waiting for the doctor to call back. Pt called to request the nurse to call her back.

## 2014-12-14 NOTE — Telephone Encounter (Signed)
Pt called requesting for test result. Please call pt back.

## 2014-12-14 NOTE — Telephone Encounter (Signed)
Called patient on 12/14/14 at 4:22 pm to discuss labs results and patient expressed her understanding. She was also reminded of her follow up appointment with Dr. Redmond Pulling on 12/22/14.

## 2014-12-14 NOTE — Telephone Encounter (Signed)
Please call pt with results of labs from visit on 8/29 # [phone]

## 2014-12-15 DIAGNOSIS — R002 Palpitations: Secondary | ICD-10-CM | POA: Insufficient documentation

## 2014-12-15 DIAGNOSIS — R208 Other disturbances of skin sensation: Secondary | ICD-10-CM | POA: Insufficient documentation

## 2014-12-15 DIAGNOSIS — R079 Chest pain, unspecified: Secondary | ICD-10-CM | POA: Insufficient documentation

## 2014-12-15 DIAGNOSIS — F319 Bipolar disorder, unspecified: Secondary | ICD-10-CM | POA: Diagnosis not present

## 2014-12-15 NOTE — Addendum Note (Signed)
Addended by: Truddie Crumble on: 12/15/2014 09:53 AM   Modules accepted: Orders

## 2014-12-15 NOTE — Assessment & Plan Note (Signed)
Burning sensation of skin x 4 days which is intermittent and present on her face, torso (front and back), and bilateral upper extremities. Patient attributes this problem to cigar smoking. However, it is unclear whether nicotine toxicity would cause a burning sensation of skin. Not likely due to diabetes because it is not in a glove-stocking distribution. Not likely neuropathic because the distribution is diffuse. Patient denies any focal neurological symptoms such has HA, weakness, numbness, or tingling.  Not likely shingles because she does not have a rash and the burning sensation is not in a dermatomal pattern. B12 level checked at this visit and came back normal (590), spoke to patient on phone 12/14/14 at 4:22 pm and she is aware. It is not clear what is causing her to have this sensation in her skin. -Suggested she stop smoking cigars -Reassess symptoms at next visit (12/22/14)

## 2014-12-15 NOTE — Progress Notes (Signed)
Patient ID: [name], female   DOB: April 14, 1966, 49 y.o.   MRN: [id number]   Subjective:   Patient ID: [name] female   DOB: 1966-02-03 49 y.o.   MRN: [id number]  HPI: [name] is a 49 y.o. F with a  PMHx as listed below presenting to the clinic with complaints of burning sensation of skin, chest pain, and palpitations. Patient states she started smoking cigars 20 days ago and started experiencing a burning sensation in her skin 4 days ago. She thinks it is due to nicotine overdose from cigars. The sensation is intermittent and present on her face, torso (front and back), and bilateral upper extremities. Denies any focal neurological symptoms such has HA, weakness, numbness, or tingling. Patient is also complaining of intermittent chest pain and palpitations that also started 4 days ago. States the chest pain is "stabbing" in nature and radiates from the right side of the chest to the left. States the chest pain is a/w diaphoresis and nausea. Reports having one episode of SOB at night which resolved after she used her inhaler. States she has been feeling weak and tired since her symptoms started. Denies any trauma to the chest wall. Denies any fevers, chills, vomiting, abdominal pain, diarrhea, or constipation. States she drinks 4 small bottles of Pepsi everyday. Denies any skin, hair, or nail changes.     Past Medical History  Diagnosis Date  . Arteriosclerotic cardiovascular disease (ASCVD)     Minimal at cath in [facility].stress nuclear study in 8/08 with nl EF; neg stress echo in 2010  . Diabetes mellitus, type 2 2000    Onset in 2000; no insulin  . Hyperlipidemia   . Hypertension `    during treatment with Geodon  . Gastroesophageal reflux disease     Schatzki's ring  . Anemia, iron deficiency   . Alcohol abuse   . Depression   . Community acquired pneumonia 01/03/10, 05/2010, 04/2012    2011; with pleural effusion-[facility] acute resp failure;  intubated in Jan 2014 (HMPV pneumonia)  . Obesity   . Schizoaffective disorder     requiring multiple psychiatric admissions  . Dysphagia   . Diastolic dysfunction     grade 2 per echo 2011  . Pulmonary hypertension 05/02/2012    Patient needs repeat echo in 06/2012   . History of alcohol abuse 07/22/2007    Qualifier: Diagnosis of  By: Lenn Cal    . PTSD (post-traumatic stress disorder)    Current Outpatient Prescriptions  Medication Sig Dispense Refill  . ACCU-CHEK FASTCLIX LANCETS MISC Use to check blood sugar 3 to 4 times daily. diag code E11.9. Insulin dependent 204 each 11  . acetaminophen (TYLENOL) 500 MG tablet Take 1,000 mg by mouth every 6 (six) hours as needed for mild pain.    Marland Kitchen albuterol (PROVENTIL HFA;VENTOLIN HFA) 108 (90 BASE) MCG/ACT inhaler Inhale 2 puffs into the lungs 3 (three) times daily as needed for wheezing or shortness of breath. 3 Inhaler 0  . albuterol (PROVENTIL) (2.5 MG/3ML) 0.083% nebulizer solution Take 3 mLs (2.5 mg total) by nebulization every 6 (six) hours as needed for wheezing or shortness of breath. 90 mL 3  . esomeprazole (NEXIUM) 40 MG capsule TAKE ONE CAPSULE BY MOUTH TWICE DAILY BEFORE A MEAL. 60 capsule 0  . furosemide (LASIX) 20 MG tablet Take 1 tablet (20 mg total) by mouth daily. 30 tablet 6  . glucose blood (ACCU-CHEK SMARTVIEW) test strip Use to check  blood sugar 3 to 4 times daily. diag code E11.9. Insulin dependent. 120 each 11  . hydrochlorothiazide (HYDRODIURIL) 12.5 MG tablet Take 12.5 mg by mouth daily.    Marland Kitchen HYDROcodone-acetaminophen (NORCO/VICODIN) 5-325 MG per tablet Take by mouth.    . hydrOXYzine (ATARAX/VISTARIL) 10 MG tablet Take 10 mg by mouth daily.    Marland Kitchen ibuprofen (ADVIL,MOTRIN) 200 MG tablet Take 400 mg by mouth every 6 (six) hours as needed for mild pain.    . Insulin Glargine (LANTUS SOLOSTAR) 100 UNIT/ML Solostar Pen Inject 18 Units into the skin at bedtime. (Patient taking differently: Inject 22 Units into the skin at  bedtime. ) 15 mL 1  . lisinopril (PRINIVIL,ZESTRIL) 40 MG tablet Take 1 tablet (40 mg total) by mouth daily. 90 tablet 1  . loperamide (IMODIUM A-D) 2 MG tablet Take 1 tablet (2 mg total) by mouth 4 (four) times daily as needed for diarrhea or loose stools. 30 tablet 0  . loratadine (CLARITIN) 10 MG tablet Take 1 tablet (10 mg total) by mouth daily. 30 tablet 3  . LORazepam (ATIVAN) 1 MG tablet Take one tablet every 12 hours if needed for burning sensation 15 tablet 0  . lovastatin (MEVACOR) 20 MG tablet Take 1 tablet (20 mg total) by mouth at bedtime. 90 tablet 3  . metFORMIN (GLUCOPHAGE) 1000 MG tablet TAKE (1) TABLET BY MOUTH TWICE DAILY WITH MEALS. 60 tablet 11  . metoprolol (LOPRESSOR) 100 MG tablet Take 1 tablet (100 mg total) by mouth 2 (two) times daily. 180 tablet 1  . mometasone (NASONEX) 50 MCG/ACT nasal spray Place 2 sprays into the nose 2 (two) times daily. 17 g 12  . nicotine (NICODERM CQ - DOSED IN MG/24 HOURS) 21 mg/24hr patch APPLY 1 PATCH TO THE SKIN DAILY AS DIRECTED. 28 patch 0  . nitroGLYCERIN (NITROSTAT) 0.3 MG SL tablet Place 1 tablet (0.3 mg total) under the tongue every 5 (five) minutes as needed for chest pain. 25 tablet 3  . NOVOFINE 32G X 6 MM MISC     . potassium chloride SA (K-DUR,KLOR-CON) 20 MEQ tablet Take 20 mEq by mouth daily.    Marland Kitchen topiramate (TOPAMAX) 50 MG tablet Take 1 tablet (50 mg total) by mouth 2 (two) times daily. 60 tablet 11  . [DISCONTINUED] ziprasidone (GEODON) 80 MG capsule Take 1 capsule (80 mg total) by mouth 2 (two) times daily with a meal. (Patient taking differently: Take 160 mg by mouth at bedtime. ) 60 capsule 0   No current facility-administered medications for this visit.   Facility-Administered Medications Ordered in Other Visits  Medication Dose Route Frequency Provider Last Rate Last Dose  . 0.9 %  sodium chloride infusion   Intravenous Once Merryl Hacker, MD      . ondansetron [facility]) injection 4 mg  4 mg Intramuscular Once  Merryl Hacker, MD      . pantoprazole (PROTONIX) injection 40 mg  40 mg Intravenous Once Merryl Hacker, MD       Family History  Problem Relation Age of Onset  . Colon cancer Other   . Hypertension Mother   . Stroke Father     deceased at age 21  . Heart disease Sister   . Anesthesia problems Neg Hx   . Hypotension Neg Hx   . Malignant hyperthermia Neg Hx   . Pseudochol deficiency Neg Hx   . Diabetes    . High Cholesterol    . Arthritis     Social  History   Social History  . Marital Status: Single    Spouse Name: N/A  . Number of Children: 2  . Years of Education: some colge   Occupational History  . Disability   . UNEMPLOYED    Social History Main Topics  . Smoking status: Current Every Day Smoker -- 1.00 packs/day for 30 years    Types: Cigarettes    Start date: 05/18/2012  . Smokeless tobacco: Never Used     Comment: cutting back,  . Alcohol Use: No     Comment: hx of ETOH abuse  . Drug Use: No  . Sexual Activity: No   Other Topics Concern  . 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  HENT: Negative for ear pain.   Eyes: Negative for blurred vision and pain.  Respiratory: Negative for cough, shortness of breath and wheezing.   Cardiovascular: Positive for chest pain and palpitations. Negative for leg swelling.  Gastrointestinal: Positive for nausea. Negative for heartburn, vomiting, abdominal pain, diarrhea and constipation.  Genitourinary: Negative for dysuria, urgency and frequency.  Skin: Negative for itching and rash.       Burning sensation  Neurological: Negative for dizziness, tingling, sensory  change, focal weakness and headaches.    Objective:  Physical Exam: Filed Vitals:   12/13/14 1556  BP: 139/78  Pulse: 88  Temp: 99 F (37.2 C)  TempSrc: Oral  Height: 5\' 4"  (1.626 m)  Weight: 202 lb 12.8 oz (91.989 kg)  SpO2: 100%   Physical Exam  Constitutional: She is oriented to person, place, and time. She appears well-developed and well-nourished. No distress.  HENT:  Head: Normocephalic and atraumatic.  Eyes: EOM are normal. Pupils are equal, round, and reactive to light.  Neck: Neck supple. No tracheal deviation present.  Cardiovascular: Normal rate, regular rhythm and intact distal pulses.  Exam reveals no gallop and no friction rub.   No murmur heard. Pulmonary/Chest: Effort normal. No respiratory distress. She has no wheezes. She has no rales.  Abdominal: Soft. Bowel sounds are normal. She exhibits no distension. There is no tenderness.  Musculoskeletal: She exhibits no edema.  No hand tremors No skin, hair, or nail abnormalities noted.   Neurological: She is alert and oriented to person, place, and time.  Cn II-XII grossly intact Sensation and strength grossly intact in b/l upper and lower extremities   Skin: Skin is warm and dry. No rash noted. No erythema.    Assessment & Plan:

## 2014-12-15 NOTE — Progress Notes (Signed)
Internal Medicine Clinic Attending  I saw and evaluated the patient.  I personally confirmed the key portions of the history and exam documented by Dr. Marlowe Sax and I reviewed pertinent patient test results.  The assessment, diagnosis, and plan were formulated together and I agree with the documentation in the resident's note.  Also reviewed ED note and EKG from that visit on day prior to clinic visit.  No clear explanation for patients symptoms forthcoming.  Agree with Dr. Elon Jester assessment.

## 2014-12-15 NOTE — Assessment & Plan Note (Addendum)
A1c 7.1 in 08/2014. Goal <7. Urine microalbumin/ Cr ratio ordered at this visit normal. States she is using Lantus 22 u at bedtime and Metformin 1000 mg BID.   -F/u A1c from this visit, still pending  -Patient has been advised to bring her glucose meter and log to next visit in 1 week.   Addendum: A1c 8.1 at this visit.

## 2014-12-15 NOTE — Assessment & Plan Note (Signed)
Palpitations x 4 days. States she has been feeling weak and tired since her symptoms started. Denies any fevers, chills, vomiting, abdominal pain, diarrhea, or constipation. Patient did loose 10 lbs since her last visit (11/25/14) but she is currently on Lasix 20 mg qd. H/ H 12.0/ 37.7 at this visit, symptoms not likely related to anemia. Denies any skin, hair, or nail changes. TSH normal at this visit, symptoms such palpitations and weight loss cannot be attributed to hypothyroidism. Patient has been smoking cigars for the past few weeks and palpitations could likely be related to excess nicotine. Excess caffeine consumption could have further exacerbated her palpitations because she admits to drinking 4 small bottles of Pepsi everyday.  -Suggested she cut down her intake of caffeinated beverages -Suggested she stop smoking cigars  -Reassess at next visit. If she still continues to have CP and palpitations consider panic attacks/ anxiety as a differential.

## 2014-12-15 NOTE — Assessment & Plan Note (Signed)
Intermittent chest pain x 4 days. Pain is "stabbing" in nature and radiates from the right side of the chest to the left. It is a/w diaphoresis and nausea. Pt reports having one episode of SOB at night which resolved after she used her inhaler. Denies any trauma to the chest wall. EKG ordered at this visit did not show any acute ST/T wave changes. Stat troponin negative. Vitals stable - BP 139/78, P 88. Chest pain not likely cardiogenic in nature. Patient denies having any symptoms of GERD at present. Chest pain could be musculoskeletal because it is "stabbing" and intermittent in character.   -Reassess symptoms at next visit (12/22/14). If she still continues to have CP and palpitations consider other causes such as panic attack/ anxiety.

## 2014-12-16 LAB — SPECIMEN STATUS REPORT

## 2014-12-16 LAB — HEMOGLOBIN A1C
ESTIMATED AVERAGE GLUCOSE: 186 mg/dL
Hgb A1c MFr Bld: 8.1 % — ABNORMAL HIGH (ref 4.8–5.6)

## 2014-12-17 ENCOUNTER — Ambulatory Visit: Payer: Medicare Other | Admitting: Podiatry

## 2014-12-20 ENCOUNTER — Other Ambulatory Visit: Payer: Self-pay | Admitting: Internal Medicine

## 2014-12-21 ENCOUNTER — Telehealth: Payer: Self-pay | Admitting: Internal Medicine

## 2014-12-21 NOTE — Telephone Encounter (Signed)
Please call pt back regarding bp.

## 2014-12-21 NOTE — Telephone Encounter (Addendum)
Call to patient to confirm appointment for 12/22/14 at 3:45 lmtcb

## 2014-12-21 NOTE — Telephone Encounter (Signed)
Left message on phone ID recording.

## 2014-12-22 ENCOUNTER — Encounter: Payer: Self-pay | Admitting: Internal Medicine

## 2014-12-22 ENCOUNTER — Ambulatory Visit (INDEPENDENT_AMBULATORY_CARE_PROVIDER_SITE_OTHER): Payer: Medicare Other | Admitting: Internal Medicine

## 2014-12-22 VITALS — BP 121/76 | HR 86 | Temp 98.4°F | Ht 64.0 in | Wt 195.7 lb

## 2014-12-22 DIAGNOSIS — I10 Essential (primary) hypertension: Secondary | ICD-10-CM | POA: Diagnosis present

## 2014-12-22 DIAGNOSIS — E1165 Type 2 diabetes mellitus with hyperglycemia: Secondary | ICD-10-CM | POA: Diagnosis not present

## 2014-12-22 DIAGNOSIS — Z79899 Other long term (current) drug therapy: Secondary | ICD-10-CM | POA: Diagnosis not present

## 2014-12-22 DIAGNOSIS — Z794 Long term (current) use of insulin: Secondary | ICD-10-CM | POA: Diagnosis not present

## 2014-12-22 DIAGNOSIS — IMO0002 Reserved for concepts with insufficient information to code with codable children: Secondary | ICD-10-CM

## 2014-12-22 LAB — GLUCOSE, CAPILLARY: GLUCOSE-CAPILLARY: 134 mg/dL — AB (ref 65–99)

## 2014-12-22 NOTE — Assessment & Plan Note (Addendum)
BP Readings from Last 3 Encounters:  12/22/14 121/76  12/13/14 139/78  12/12/14 130/85    Lab Results  Component Value Date   NA 136 12/12/2014   K 4.0 12/12/2014   CREATININE 0.80 12/12/2014    Assessment: Blood pressure control:  well controlled Progress toward BP goal:   at goal Comments: Compliant with therapy.  Plan: Medications:  continue current medications:  Lisinopril 40mg  daily, HCTZ 12.5mg  daily, lopressor 100mg  BID, Lasix 20mg  daily Educational resources provided: brochure (denied) Self management tools provided:   Other plans: RTC in 3 months

## 2014-12-22 NOTE — Telephone Encounter (Signed)
Pt has a scheduled appointment today. Her BP is running good and she wanted to know if she could stop her BP medication.  She has been educated by Software engineer.

## 2014-12-22 NOTE — Progress Notes (Signed)
Subjective:    Patient ID: [name], female    DOB: 1965-12-10, 49 y.o.   MRN: [id number]  HPI Comments: [name] is a 49 year old woman with PMH as below here for follow-up of chronic conditions.  Please see problem based charting for assessment and plan.     Past Medical History  Diagnosis Date  . Arteriosclerotic cardiovascular disease (ASCVD)     Minimal at cath in [facility].stress nuclear study in 8/08 with nl EF; neg stress echo in 2010  . Diabetes mellitus, type 2 2000    Onset in 2000; no insulin  . Hyperlipidemia   . Hypertension `    during treatment with Geodon  . Gastroesophageal reflux disease     Schatzki's ring  . Anemia, iron deficiency   . Alcohol abuse   . Depression   . Community acquired pneumonia 01/03/10, 05/2010, 04/2012    2011; with pleural effusion-[facility] acute resp failure; intubated in Jan 2014 (HMPV pneumonia)  . Obesity   . Schizoaffective disorder     requiring multiple psychiatric admissions  . Dysphagia   . Diastolic dysfunction     grade 2 per echo 2011  . Pulmonary hypertension 05/02/2012    Patient needs repeat echo in 06/2012   . History of alcohol abuse 07/22/2007    Qualifier: Diagnosis of  By: Lenn Cal    . PTSD (post-traumatic stress disorder)    Current Outpatient Prescriptions on File Prior to Visit  Medication Sig Dispense Refill  . ACCU-CHEK FASTCLIX LANCETS MISC Use to check blood sugar 3 to 4 times daily. diag code E11.9. Insulin dependent 204 each 11  . acetaminophen (TYLENOL) 500 MG tablet Take 1,000 mg by mouth every 6 (six) hours as needed for mild pain.    Marland Kitchen albuterol (PROVENTIL HFA;VENTOLIN HFA) 108 (90 BASE) MCG/ACT inhaler Inhale 2 puffs into the lungs 3 (three) times daily as needed for wheezing or shortness of breath. 3 Inhaler 0  . albuterol (PROVENTIL) (2.5 MG/3ML) 0.083% nebulizer solution Take 3 mLs (2.5 mg total) by nebulization every 6 (six) hours as needed for wheezing or shortness of  breath. 90 mL 3  . esomeprazole (NEXIUM) 40 MG capsule TAKE ONE CAPSULE BY MOUTH TWICE DAILY BEFORE A MEAL. 60 capsule 0  . furosemide (LASIX) 20 MG tablet Take 1 tablet (20 mg total) by mouth daily. 30 tablet 6  . glucose blood (ACCU-CHEK SMARTVIEW) test strip Use to check blood sugar 3 to 4 times daily. diag code E11.9. Insulin dependent. 120 each 11  . hydrochlorothiazide (HYDRODIURIL) 12.5 MG tablet Take 12.5 mg by mouth daily.    Marland Kitchen HYDROcodone-acetaminophen (NORCO/VICODIN) 5-325 MG per tablet Take by mouth.    . hydrOXYzine (ATARAX/VISTARIL) 10 MG tablet Take 10 mg by mouth daily.    Marland Kitchen ibuprofen (ADVIL,MOTRIN) 200 MG tablet Take 400 mg by mouth every 6 (six) hours as needed for mild pain.    . Insulin Glargine (LANTUS SOLOSTAR) 100 UNIT/ML Solostar Pen Inject 18 Units into the skin at bedtime. (Patient taking differently: Inject 22 Units into the skin at bedtime. ) 15 mL 1  . lisinopril (PRINIVIL,ZESTRIL) 40 MG tablet Take 1 tablet (40 mg total) by mouth daily. 90 tablet 1  . loperamide (IMODIUM A-D) 2 MG tablet Take 1 tablet (2 mg total) by mouth 4 (four) times daily as needed for diarrhea or loose stools. 30 tablet 0  . loratadine (CLARITIN) 10 MG tablet Take 1 tablet (10 mg total)  by mouth daily. 30 tablet 3  . LORazepam (ATIVAN) 1 MG tablet Take one tablet every 12 hours if needed for burning sensation 15 tablet 0  . lovastatin (MEVACOR) 20 MG tablet Take 1 tablet (20 mg total) by mouth at bedtime. 90 tablet 3  . metFORMIN (GLUCOPHAGE) 1000 MG tablet TAKE (1) TABLET BY MOUTH TWICE DAILY WITH MEALS. 60 tablet 11  . metoprolol (LOPRESSOR) 100 MG tablet Take 1 tablet (100 mg total) by mouth 2 (two) times daily. 180 tablet 1  . mometasone (NASONEX) 50 MCG/ACT nasal spray Place 2 sprays into the nose 2 (two) times daily. 17 g 12  . nicotine (NICODERM CQ - DOSED IN MG/24 HOURS) 21 mg/24hr patch APPLY 1 PATCH TO THE SKIN DAILY AS DIRECTED. 28 patch 0  . nitroGLYCERIN (NITROSTAT) 0.3 MG SL tablet  Place 1 tablet (0.3 mg total) under the tongue every 5 (five) minutes as needed for chest pain. 25 tablet 3  . NOVOFINE 32G X 6 MM MISC     . potassium chloride SA (K-DUR,KLOR-CON) 20 MEQ tablet Take 20 mEq by mouth daily.    Marland Kitchen topiramate (TOPAMAX) 50 MG tablet Take 1 tablet (50 mg total) by mouth 2 (two) times daily. 60 tablet 11  . [DISCONTINUED] ziprasidone (GEODON) 80 MG capsule Take 1 capsule (80 mg total) by mouth 2 (two) times daily with a meal. (Patient taking differently: Take 160 mg by mouth at bedtime. ) 60 capsule 0    Review of Systems  Constitutional: Negative for fever, chills and appetite change.  Respiratory: Negative for shortness of breath.   Gastrointestinal: Negative for abdominal pain.  Genitourinary: Negative for difficulty urinating.  Neurological: Negative for headaches.  Psychiatric/Behavioral: Negative for dysphoric mood.       Filed Vitals:   12/22/14 1553  BP: 121/76  Pulse: 86  Temp: 98.4 F (36.9 C)  TempSrc: Oral  Height: 5\' 4"  (1.626 m)  Weight: 195 lb 11.2 oz (88.769 kg)  SpO2: 100%     Objective:   Physical Exam  Constitutional: She is oriented to person, place, and time. She appears well-developed. No distress.  HENT:  Head: Normocephalic and atraumatic.  Mouth/Throat: Oropharynx is clear and moist. No oropharyngeal exudate.  Eyes: EOM are normal. Pupils are equal, round, and reactive to light.  Neck: Neck supple.  Cardiovascular: Normal rate, regular rhythm and normal heart sounds.  Exam reveals no gallop and no friction rub.   No murmur heard. Pulmonary/Chest: Effort normal and breath sounds normal. No respiratory distress. She has no wheezes. She has no rales.  Abdominal: Soft. Bowel sounds are normal. She exhibits no distension and no mass. There is no tenderness. There is no rebound and no guarding.  Musculoskeletal: Normal range of motion. She exhibits edema. She exhibits no tenderness.  Trace B/L LE edema  Neurological: She is  alert and oriented to person, place, and time. No cranial nerve deficit.  Skin: Skin is warm. She is not diaphoretic.  Psychiatric: She has a normal mood and affect. Her behavior is normal. Judgment and thought content normal.  Vitals reviewed.         Assessment & Plan:  Please see problem based charting for assessment and plan.

## 2014-12-22 NOTE — Progress Notes (Signed)
[facility] Medicine Clinic Attending  Case discussed with Dr. Wilson soon after the resident saw the patient.  We reviewed the resident's history and exam and pertinent patient test results.  I agree with the assessment, diagnosis, and plan of care documented in the resident's note.  

## 2014-12-22 NOTE — Patient Instructions (Signed)
1. Continue Lantus 24 units daily.  If your morning blood sugars are less than 90 then decrease your Lantus to 22 units.  After a few days, if the blood sugars are still less than 90 decrease to 20 units.  Call me if you keep having blood sugars less than 90 so we can make any other changes.  Keep taking metformin twice per day.   2. Please take all medications as prescribed.    3. If you have worsening of your symptoms or new symptoms arise, please call the clinic ([phone]), or go to the ER immediately if symptoms are severe.   Come back to see me in 2-3 months or sooner if needed.

## 2014-12-23 NOTE — Assessment & Plan Note (Signed)
Lab Results  Component Value Date   HGBA1C 8.1* 12/13/2014   HGBA1C 7.1 08/25/2014   HGBA1C 7.6 05/24/2014     Assessment: Diabetes control:  worsening control Progress toward A1C goal:   deteriorated Comments: A1c 8.1 at most recent visit.  She again did not bring her meter but reports CBGs have been higher, 150s-200 but not greater than 200.  Insulin had been reduced to 18 units in June due to episodes of subjective hypoglycemia (she did not check CBG when she felt low).  The patient reports compliance with insulin and has slowly increased dose, now up to 24 units qHS.  She also reports compliance with Metformin.  She thinks her last insulin pen got too hot and she just opened a new pen yesterday.    Plan: Medications:  continue current medications:  Lantus 24 units qHS (since she has increased to this in recent days); metformin 1000mg  BID.  She will titrate insulin down by 2 units q3 days if AM CBG 90 or less.  She will return to see me in 1 month. Home glucose monitoring: yes Frequency:  BID Timing:  qAM and PM Instruction/counseling given: reminded to bring blood glucose meter & log to each visit Educational resources provided: brochure (denied) Self management tools provided:   Other plans: RTC in 1 month.

## 2014-12-29 ENCOUNTER — Other Ambulatory Visit: Payer: Self-pay | Admitting: Internal Medicine

## 2014-12-29 DIAGNOSIS — IMO0002 Reserved for concepts with insufficient information to code with codable children: Secondary | ICD-10-CM

## 2014-12-29 DIAGNOSIS — E1165 Type 2 diabetes mellitus with hyperglycemia: Secondary | ICD-10-CM

## 2014-12-29 NOTE — Telephone Encounter (Signed)
Pt called requesting needles to be filled for her insulin.

## 2014-12-30 ENCOUNTER — Other Ambulatory Visit: Payer: Self-pay | Admitting: Internal Medicine

## 2014-12-30 MED ORDER — NOVOFINE 32G X 6 MM MISC: Status: DC

## 2014-12-30 NOTE — Telephone Encounter (Signed)
Pt called requesting nexium to be filled.

## 2015-01-02 MED ORDER — ESOMEPRAZOLE MAGNESIUM 40 MG PO CPDR: DELAYED_RELEASE_CAPSULE | ORAL | Status: DC

## 2015-01-12 ENCOUNTER — Telehealth: Payer: Self-pay | Admitting: Neurology

## 2015-01-12 NOTE — Telephone Encounter (Signed)
Anderson Malta with [facility] called inquiring about order for LP in August, does she still want that done as pt is not returning phone calls. She can be reached at [phone].

## 2015-01-12 NOTE — Telephone Encounter (Signed)
Spoke to patient. She stated she does not need to have the LP done. Called [place] @ [facility], left detailed vmail.

## 2015-01-13 NOTE — Telephone Encounter (Signed)
Spoke to patient. She doesn't want the LP. Advised patient that LP was to rule out intracranial hypertension, discussed at length at appointment, and that this condition untreated can cause permanent vision loss. She understands. I do highly recommend LP and treatment and follow up with me soon. She would like to follow up with me in clinic first. Advised her to call office and schedule an appointment.

## 2015-01-17 ENCOUNTER — Telehealth: Payer: Self-pay

## 2015-01-17 ENCOUNTER — Telehealth: Payer: Self-pay | Admitting: Internal Medicine

## 2015-01-17 ENCOUNTER — Ambulatory Visit: Payer: Medicare Other | Admitting: Neurology

## 2015-01-17 NOTE — Telephone Encounter (Signed)
Pt want to know how much insulin should she give herself. Please call pt back.

## 2015-01-17 NOTE — Telephone Encounter (Signed)
Spoke to patient. Dr. Jaynee Eagles is out of office. We were able to reschedule appt to next Wed.

## 2015-01-18 NOTE — Telephone Encounter (Signed)
She reports her blood sugars have been in the 80-140 range fore the most part, and she has decreased her Lantus to 20 units last night after having a blood sugar of 87 yesterday per Dr. Dois Davenport instructions. She checked after eating this am and it was 117. We discussed that her blood sugars should be between 90 and 120 before meals and to decrease it further to 18 IF she has another blood sugar < 90mg /dl.  For now she will remain at 20 units Lantus daily.

## 2015-01-19 NOTE — Telephone Encounter (Signed)
Agree.  Also, she is scheduled to see me for DM follow-up in 3 weeks.

## 2015-01-26 ENCOUNTER — Encounter: Payer: Self-pay | Admitting: Neurology

## 2015-01-26 ENCOUNTER — Ambulatory Visit (INDEPENDENT_AMBULATORY_CARE_PROVIDER_SITE_OTHER): Payer: Medicare Other | Admitting: Neurology

## 2015-01-26 VITALS — BP 146/91 | HR 85 | Ht 64.0 in | Wt 195.0 lb

## 2015-01-26 DIAGNOSIS — R51 Headache: Secondary | ICD-10-CM | POA: Diagnosis not present

## 2015-01-26 DIAGNOSIS — R519 Headache, unspecified: Secondary | ICD-10-CM

## 2015-01-26 NOTE — Progress Notes (Signed)
[facility]     Provider: Dr Jaynee Eagles Referring Provider: Francesca Oman, DO Primary Care Physician: Duwaine Maxin, DO  CC: headache  HPI: [name] is a 49 y.o. female here as a referral from Dr. Redmond Pulling for headache  Interval update 01/26/2015: She feels better. She has lost weight and is on loratadine and she is feeling better. The headaches are gone since starting the loratadine. No blurry vision, no dizziness, no headaches. She is drinking a lot of water and staying away from sweets. She has lost 17 pounds. headaches resolved.  Interval update 11/25/2014: She stopped the Topamax. The Topamax was helping with the headaches but went off of it a month ago. She has episodes of blurry vision. She feels dizzy. She has had a headache for 3 days straight. Before this she was starting to have headaches a few days a week. The headache feels like pressure on top of the forehead. She is having headache and nausea. No neck stiffness. No hearing changes. Partially empty sella on MRi otherwise no signs of IIH in MRi (no flattening of the globes or increased fluid within the optic nerve sheaths). Sleep study in May did not show any significant obstructive sleep apnea.  Reviewed notes, labs and imaging from outside physicians, which showed: CBC unremarkable, CMP with slightly elevated creatinine at 1.04 and slightly decreased sodium at 133.  MRi brain 08/26/2014: The brain parenchyma shows no abnormal signal intensities. No structural lesion, tumor infarcts are noted. The subarachnoid spaces and ventricular systems appear normal. Calvarium shows no abnormalities. Orbits appear unremarkable. Paranasal sinuses show mild chronic mucosal thickening. The pituitary gland is slightly atrophied with minimally enlarged sella. The visualized portion of the upper cervical spine appear unremarkable. Flow-voids of the large vessels of the intracranial circulation appear to be patent. Postcontrast  images do not result in any abnormal areas of enhancement. The eighth nerve complex and inner ear structures appear normal.  IMPRESSION: Unremarkable MRI scan of the brain with and without contrast  HPI 08/12/2014: [name] is a 49 y.o. female here as a referral from Dr. Erik Obey for headache. Past medical history hypertension, diabetes, migraine, depression, anxiety, high cholesterol.  Headaches started 3-4 months ago.Worsening. Used to have them as a child but they went away. Pain and pressure, behind the nose. Thought it was a sinus infection an CT was negative. Worsening. Every day. Tyelnol/advil every day, not helping. Worse at night. With vomiting. Wakes up with them every day. Pressure. Vision is blurry at times. Severe pressure all around the head. She hears her heart beat in her ears. No recent excessive weight gain. Smokes and is coughing today in the office. Wearing a patch, quitting smoking. Worsening memory. She falls asleep at a drop. She has been told she has OSA in the past but never did the test. If sitting on the couch she will fall asleep.   Reviewed notes, labs and imaging from outside physicians, which showed: Notes from [facility] show that patient was evaluated in March 2016 about a sinus problem. Patient was given several rounds of antibiotics, the maxillofacial CT did not show sinusitis. She uses Nasonex. She also reported chronic headaches, daily. Putting daily headaches for the past 4 years, localized over the forehead and in between the eyes. Throbbing. Worse outdoors. Worse in the spring and fall. Maxillofacial CT scan on 07/29/2014 showed no evidence of sinusitis, very minimal mucosal thickening present in the maxillary sinuses and the right partition of the sphenoid  sinus.   CT head 2015 showed No acute intracranial abnormalities including mass lesion or mass effect, hydrocephalus, extra-axial fluid collection, midline shift, hemorrhage, or acute infarction,  large ischemic events (personally reviewed images)  Review of Systems: Patient complains of symptoms per HPI as well as the following symptoms: Appetite change, chills, fever, cough, shortness of breath, chest tightness, facial swelling, headache, itching. Pertinent negatives per HPI. All others negative.   Social History   Social History  . Marital Status: Single    Spouse Name: N/A  . Number of Children: 2  . Years of Education: some colge   Occupational History  . Disability   . UNEMPLOYED    Social History Main Topics  . Smoking status: Current Every Day Smoker -- 1.00 packs/day for 30 years    Types: Cigarettes    Start date: 05/18/2012  . Smokeless tobacco: Never Used     Comment: cutting back,  . Alcohol Use: No     Comment: hx of ETOH abuse  . Drug Use: No  . Sexual Activity: No   Other Topics Concern  . 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 Colon cancer Other   . Hypertension Mother   . Stroke Father     deceased at age 47  . Heart disease Sister   . Anesthesia problems Neg Hx   . Hypotension Neg Hx   . Malignant hyperthermia Neg Hx   . Pseudochol deficiency Neg Hx   . Diabetes    . High Cholesterol    . Arthritis      Past Medical History  Diagnosis Date  . Arteriosclerotic cardiovascular disease (ASCVD)     Minimal at cath in [facility].stress nuclear study in 8/08 with nl EF; neg stress echo in 2010  . Diabetes mellitus, type 2 ([place]) 2000    Onset in 2000; no insulin  . Hyperlipidemia   . Hypertension `    during treatment with Geodon  . Gastroesophageal reflux disease     Schatzki's ring  . Anemia, iron deficiency    . Alcohol abuse   . Depression   . Community acquired pneumonia 01/03/10, 05/2010, 04/2012    2011; with pleural effusion-[facility] acute resp failure; intubated in Jan 2014 (HMPV pneumonia)  . Obesity   . Schizoaffective disorder     requiring multiple psychiatric admissions  . Dysphagia   . Diastolic dysfunction     grade 2 per echo 2011  . Pulmonary hypertension ([place]) 05/02/2012    Patient needs repeat echo in 06/2012   . History of alcohol abuse 07/22/2007    Qualifier: Diagnosis of  By: Lenn Cal    . PTSD (post-traumatic stress disorder)     Past Surgical History  Procedure Laterality Date  . Dilation and curettage, diagnostic / therapeutic  1992  . Esophagogastroduodenoscopy  09/16/08    Dr. Trevor Iha hiatal hernia/excoriations involving the cardia and mucosa consistent with trauma, antral erosions  of linear petechiae ? gastritis versus early gastric antral vascular  ectasia.Marland Kitchen biopsy showed reactive gastropathy. No H. pylori.  . Esophagogastroduodenoscopy  09/2007    Dr. Evalee Mutton ring, dilated to 56  [place] dilator, small hiatal hernia, antral erosions, biopsies reactive gastropathy.  Azzie Almas dilation  07/17/2011    [facility] sedation-->distal esophageal stricture s/p dilation, chronic gastritis, multiple ulcers in stomach. no h.pylori  . Colonoscopy  01/2006    internal hemorrhoids  . Colonoscopy  01/10/2012    Dr. Rourk:Single anal canal hemorrhoidal tag likely source of  trivial hematochezia; right-sided colonic diverticulosis  . Esophagogastroduodenoscopy (egd) with propofol N/A 12/17/2013    [facility]:SRPRXYV antral erosions and petechiae. Small hiatal hernia. No endoscopic explanation for patient's symptoms    Current Outpatient Prescriptions  Medication Sig Dispense Refill  . ACCU-CHEK FASTCLIX LANCETS MISC Use to check blood sugar 3 to 4 times daily. diag code E11.9. Insulin dependent 204 each 11  . busPIRone (BUSPAR) 5 MG tablet Take 5 mg by mouth 2  (two) times daily.    Marland Kitchen esomeprazole (NEXIUM) 40 MG capsule TAKE ONE CAPSULE BY MOUTH TWICE DAILY BEFORE A MEAL. 60 capsule 0  . furosemide (LASIX) 20 MG tablet Take 1 tablet (20 mg total) by mouth daily. 30 tablet 6  . glucose blood (ACCU-CHEK SMARTVIEW) test strip Use to check blood sugar 3 to 4 times daily. diag code E11.9. Insulin dependent. 120 each 11  . hydrochlorothiazide (HYDRODIURIL) 12.5 MG tablet Take 12.5 mg by mouth daily.    Marland Kitchen ibuprofen (ADVIL,MOTRIN) 200 MG tablet Take 400 mg by mouth every 6 (six) hours as needed for mild pain.    Marland Kitchen LANTUS SOLOSTAR 100 UNIT/ML Solostar Pen INJECT 24 UNITS SUBCUTANEOUSLY AT BEDTIME. 15 mL 5  . lisinopril (PRINIVIL,ZESTRIL) 40 MG tablet Take 1 tablet (40 mg total) by mouth daily. 90 tablet 1  . loratadine (CLARITIN) 10 MG tablet Take 1 tablet (10 mg total) by mouth daily. 30 tablet 3  . lovastatin (MEVACOR) 20 MG tablet Take 1 tablet (20 mg total) by mouth at bedtime. 90 tablet 3  . metFORMIN (GLUCOPHAGE) 1000 MG tablet TAKE (1) TABLET BY MOUTH TWICE DAILY WITH MEALS. 60 tablet 11  . metoprolol (LOPRESSOR) 100 MG tablet Take 1 tablet (100 mg total) by mouth 2 (two) times daily. 180 tablet 1  . mometasone (NASONEX) 50 MCG/ACT nasal spray Place 2 sprays into the nose 2 (two) times daily. 17 g 12  . NOVOFINE 32G X 6 MM MISC Use to inject insulin one time daily. Dx: E11.65 100 each 6  . potassium chloride SA (K-DUR,KLOR-CON) 20 MEQ tablet Take 20 mEq by mouth daily.    . ziprasidone (GEODON) 80 MG capsule Take 160 mg by mouth daily.    . nitroGLYCERIN (NITROSTAT) 0.3 MG SL tablet Place 1 tablet (0.3 mg total) under the tongue every 5 (five) minutes as needed for chest pain. (Patient not taking: Reported on 12/22/2014) 25 tablet 3  . topiramate (TOPAMAX) 50 MG tablet Take 1 tablet (50 mg total) by mouth 2 (two) times daily. (Patient not taking: Reported on 12/22/2014) 60 tablet 11   No current facility-administered medications for this visit.    Facility-Administered Medications Ordered in Other Visits  Medication Dose Route Frequency Provider Last Rate Last Dose  . 0.9 %  sodium chloride infusion   Intravenous Once Merryl Hacker, MD      . ondansetron [facility]) injection 4 mg  4 mg Intramuscular Once Merryl Hacker, MD      . pantoprazole (PROTONIX) injection 40 mg  40 mg Intravenous Once Merryl Hacker, MD        Allergies as of 01/26/2015 - Review Complete 01/26/2015  Allergen Reaction Noted  . Cephalexin  10/14/2013  . Metronidazole Shortness Of Breath and Swelling   . Orange Itching 03/27/2011  . Shrimp [shellfish allergy] Shortness Of Breath and Itching 03/27/2011  . Penicillins Hives and Swelling   . Sulfonamide derivatives Hives   . Glipizide Other (See Comments) 01/15/2011  . Sulfamethoxazole-trimethoprim Rash 02/27/2010    Vitals: BP 146/91 mmHg  Pulse 85  Ht 5\' 4"  (1.626 m)  Wt 195 lb (88.451 kg)  BMI 33.46 kg/m2 Last Weight:  Wt Readings from Last 1 Encounters:  01/26/15 195 lb (88.451 kg)   Last Height:   Ht Readings from Last 1 Encounters:  01/26/15 5\' 4"  (1.626 m)     Physical exam: Exam: Gen: NAD, conversant, well nourised, obese, well groomed  Eyes: Conjunctivae clear without exudates or hemorrhage  Speech:  Speech is normal; fluent and spontaneous with normal comprehension.  Cognition:  The patient is oriented to person, place, and time;  Cranial Nerves:  The pupils are equal, round, and reactive to light. The fundi are flat. Visual fields are full to finger confrontation. Extraocular movements are intact. Trigeminal sensation is intact and the muscles of mastication are normal. The face is symmetric. The palate elevates in the midline. Hearing intact. Voice is normal. Shoulder shrug is normal. The tongue has normal motion without fasciculations.   Motor Observation:  No asymmetry, no atrophy, and no involuntary movements noted. Tone:  Normal  muscle tone.   Posture:  Posture is normal. normal erect   Strength:  Strength is V/V in the upper and lower limbs.        Assessment/Plan: 49 year old female with worsening headaches, pressure in the head, vision changes, morbid obesity. Resolved with diet changes and loratadine.   Sarina Ill, MD  [facility] [address] [place], [place] [address]  Phone [phone] Fax [phone]  A total of 15 minutes was spent face-to-face with this patient. Over half this time was spent on counseling patient on the headache diagnosis and different diagnostic and therapeutic options available.

## 2015-02-09 ENCOUNTER — Encounter: Payer: Self-pay | Admitting: Internal Medicine

## 2015-02-09 ENCOUNTER — Ambulatory Visit (INDEPENDENT_AMBULATORY_CARE_PROVIDER_SITE_OTHER): Payer: Medicare Other | Admitting: Internal Medicine

## 2015-02-09 VITALS — BP 159/88 | HR 79 | Temp 98.2°F | Ht 64.0 in | Wt 188.9 lb

## 2015-02-09 DIAGNOSIS — E1165 Type 2 diabetes mellitus with hyperglycemia: Secondary | ICD-10-CM

## 2015-02-09 DIAGNOSIS — IMO0001 Reserved for inherently not codable concepts without codable children: Secondary | ICD-10-CM

## 2015-02-09 DIAGNOSIS — Z794 Long term (current) use of insulin: Secondary | ICD-10-CM | POA: Diagnosis not present

## 2015-02-09 DIAGNOSIS — Z23 Encounter for immunization: Secondary | ICD-10-CM

## 2015-02-09 DIAGNOSIS — Z0000 Encounter for general adult medical examination without abnormal findings: Secondary | ICD-10-CM

## 2015-02-09 DIAGNOSIS — I10 Essential (primary) hypertension: Secondary | ICD-10-CM

## 2015-02-09 LAB — GLUCOSE, CAPILLARY: Glucose-Capillary: 77 mg/dL (ref 65–99)

## 2015-02-09 NOTE — Assessment & Plan Note (Addendum)
Lab Results  Component Value Date   HGBA1C 8.1* 12/13/2014   HGBA1C 7.1 08/25/2014   HGBA1C 7.6 05/24/2014     Assessment: Diabetes control:  fair control based on CBG logs Progress toward A1C goal:    improving based on CBG logs Comments: Blood sugar logs reviewed.  Improved control based on CBGs over the last month.  Low 77, Hi 290 (but she tells me the 290 was her aunt's reading; she let her aunt use her meter this one time because she was not feeling well).  Avg 121.  Patient has self-titrated down to 16 units and has been on this amount for a few weeks with AM sugars 90-100.  No hypoglycemic episodes.  She says she feels so much better and has more energy since she changed her diet.  Eating better.  Drinking occasional diet soda.   She occasionally fasts for religious purposes (1 day fasts) and wants a reminder on how to manage her blood sugar on these days.   Plan: Medications:  continue current medications:  Continue current 16 unit Lantus dose.  Can decrease by 2 units if AM sugars drop (<90) or she feels low.  Home glucose monitoring: yes Frequency:  BID Timing:   Instruction/counseling given: reminded to bring blood glucose meter & log to each visit Other plans: Patient congratulated on her success and her ability to manage her own insulin titration.  She will continue lifestyle changes.  Advised to take half Lantus dose on night before fast and to skip metformin on day of fast.  To resume full Lantus dose and insulin once she resumes eating.  Advised she not let other people use her CBG meter (an emergency situation where someone may have severe low or high would be an exception).  She understands and agrees.  She will call me if problems, otherwise RTC in 3 months.

## 2015-02-09 NOTE — Patient Instructions (Signed)
1. You are doing an excellent job with controlling your diabetes.  Please continue your current insulin dose of 16 units.  Take half of your usual dose (8 units) the days before your fast.  Do not take your metformin on days you are fasting.  You can go back to your full dose of insulin and metformin on the day you start eating again.    2. Please take all medications as prescribed.    3. If you have worsening of your symptoms or new symptoms arise, please call the clinic ([phone]), or go to the ER immediately if symptoms are severe.   You can return in a few weeks for PAP smear.

## 2015-02-09 NOTE — Assessment & Plan Note (Signed)
BP Readings from Last 3 Encounters:  02/09/15 159/88  01/26/15 146/91  12/22/14 121/76    Lab Results  Component Value Date   NA 136 12/12/2014   K 4.0 12/12/2014   CREATININE 0.80 12/12/2014    Assessment: Blood pressure control:  fair; normally well controlled Progress toward BP goal:   moderately elevated Comments: She has not yet taken today's BP medications and normally takes them in the AM.  Plan: Medications:  continue current medications:  HCTZ 12.5mg  daily, lisinopril 40mg  daily, lopressor 100mg  BID, lasix 20mg  daily Other plans: She will take today's medications after our visit.  I advised her to take them at the same time every day.  RTC in 3 months.

## 2015-02-09 NOTE — Progress Notes (Signed)
Subjective:    Patient ID: [name], female    DOB: 05-Jan-1966, 49 y.o.   MRN: [id number]  HPI Comments: [name] is a 49 year old woman with PMH as below here for follow-up of her diabetes.  Please see problem based charting for status of this condition.     Past Medical History  Diagnosis Date  . Arteriosclerotic cardiovascular disease (ASCVD)     Minimal at cath in [facility].stress nuclear study in 8/08 with nl EF; neg stress echo in 2010  . Diabetes mellitus, type 2 ([place]) 2000    Onset in 2000; no insulin  . Hyperlipidemia   . Hypertension `    during treatment with Geodon  . Gastroesophageal reflux disease     Schatzki's ring  . Anemia, iron deficiency   . Alcohol abuse   . Depression   . Community acquired pneumonia 01/03/10, 05/2010, 04/2012    2011; with pleural effusion-[facility] acute resp failure; intubated in Jan 2014 (HMPV pneumonia)  . Obesity   . Schizoaffective disorder     requiring multiple psychiatric admissions  . Dysphagia   . Diastolic dysfunction     grade 2 per echo 2011  . Pulmonary hypertension ([place]) 05/02/2012    Patient needs repeat echo in 06/2012   . History of alcohol abuse 07/22/2007    Qualifier: Diagnosis of  By: Lenn Cal    . PTSD (post-traumatic stress disorder)    Current Outpatient Prescriptions on File Prior to Visit  Medication Sig Dispense Refill  . ACCU-CHEK FASTCLIX LANCETS MISC Use to check blood sugar 3 to 4 times daily. diag code E11.9. Insulin dependent 204 each 11  . busPIRone (BUSPAR) 5 MG tablet Take 5 mg by mouth 2 (two) times daily.    Marland Kitchen esomeprazole (NEXIUM) 40 MG capsule TAKE ONE CAPSULE BY MOUTH TWICE DAILY BEFORE A MEAL. 60 capsule 0  . furosemide (LASIX) 20 MG tablet Take 1 tablet (20 mg total) by mouth daily. 30 tablet 6  . glucose blood (ACCU-CHEK SMARTVIEW) test strip Use to check blood sugar 3 to 4 times daily. diag code E11.9. Insulin dependent. 120 each 11  . hydrochlorothiazide  (HYDRODIURIL) 12.5 MG tablet Take 12.5 mg by mouth daily.    Marland Kitchen ibuprofen (ADVIL,MOTRIN) 200 MG tablet Take 400 mg by mouth every 6 (six) hours as needed for mild pain.    Marland Kitchen LANTUS SOLOSTAR 100 UNIT/ML Solostar Pen INJECT 24 UNITS SUBCUTANEOUSLY AT BEDTIME. 15 mL 5  . lisinopril (PRINIVIL,ZESTRIL) 40 MG tablet Take 1 tablet (40 mg total) by mouth daily. 90 tablet 1  . loratadine (CLARITIN) 10 MG tablet Take 1 tablet (10 mg total) by mouth daily. 30 tablet 3  . lovastatin (MEVACOR) 20 MG tablet Take 1 tablet (20 mg total) by mouth at bedtime. 90 tablet 3  . metFORMIN (GLUCOPHAGE) 1000 MG tablet TAKE (1) TABLET BY MOUTH TWICE DAILY WITH MEALS. 60 tablet 11  . metoprolol (LOPRESSOR) 100 MG tablet Take 1 tablet (100 mg total) by mouth 2 (two) times daily. 180 tablet 1  . mometasone (NASONEX) 50 MCG/ACT nasal spray Place 2 sprays into the nose 2 (two) times daily. 17 g 12  . nitroGLYCERIN (NITROSTAT) 0.3 MG SL tablet Place 1 tablet (0.3 mg total) under the tongue every 5 (five) minutes as needed for chest pain. (Patient not taking: Reported on 12/22/2014) 25 tablet 3  . NOVOFINE 32G X 6 MM MISC Use to inject insulin one time daily. Dx: E11.65  100 each 6  . potassium chloride SA (K-DUR,KLOR-CON) 20 MEQ tablet Take 20 mEq by mouth daily.    Marland Kitchen topiramate (TOPAMAX) 50 MG tablet Take 1 tablet (50 mg total) by mouth 2 (two) times daily. (Patient not taking: Reported on 12/22/2014) 60 tablet 11  . ziprasidone (GEODON) 80 MG capsule Take 160 mg by mouth daily.       Review of Systems  Constitutional: Negative for fever, chills and appetite change.  Respiratory: Negative for shortness of breath and wheezing.   Cardiovascular: Negative for chest pain, palpitations and leg swelling.  Gastrointestinal: Negative for nausea, vomiting, abdominal pain, diarrhea and constipation.  Endocrine: Positive for polyuria. Negative for polydipsia.  Genitourinary: Negative for dysuria, vaginal bleeding, vaginal discharge and  difficulty urinating.  Neurological: Negative for tremors, syncope and headaches.  Psychiatric/Behavioral: Negative for dysphoric mood.      Objective:   Physical Exam  Constitutional: She is oriented to person, place, and time. She appears well-developed. No distress.  HENT:  Head: Normocephalic and atraumatic.  Mouth/Throat: Oropharynx is clear and moist. No oropharyngeal exudate.  Eyes: EOM are normal. Pupils are equal, round, and reactive to light.  Neck: Neck supple.  Cardiovascular: Normal rate, regular rhythm and normal heart sounds.  Exam reveals no gallop and no friction rub.   No murmur heard. Pulmonary/Chest: Effort normal and breath sounds normal. No respiratory distress. She has no wheezes. She has no rales.  Abdominal: Soft. Bowel sounds are normal. She exhibits no distension. There is no tenderness. There is no rebound.  Musculoskeletal: Normal range of motion. She exhibits no edema or tenderness.  Neurological: She is alert and oriented to person, place, and time. No cranial nerve deficit.  Skin: Skin is warm. She is not diaphoretic.  Psychiatric: She has a normal mood and affect. Her behavior is normal. Judgment and thought content normal.  Vitals reviewed.         Assessment & Plan:  Please see problem based charting for A&P.

## 2015-02-10 NOTE — Assessment & Plan Note (Signed)
Flu vaccine administered today.

## 2015-02-14 NOTE — Progress Notes (Signed)
Case discussed with Dr. Wilson soon after the resident saw the patient. We reviewed the resident's history and exam and pertinent patient test results. I agree with the assessment, diagnosis, and plan of care documented in the resident's note. 

## 2015-02-22 ENCOUNTER — Emergency Department (INDEPENDENT_AMBULATORY_CARE_PROVIDER_SITE_OTHER)
Admission: EM | Admit: 2015-02-22 | Discharge: 2015-02-22 | Disposition: A | Discharge status: Home / Self Care | Payer: Medicare Other | Source: Home / Self Care | Attending: Family Medicine | Admitting: Family Medicine

## 2015-02-22 ENCOUNTER — Encounter (HOSPITAL_COMMUNITY): Payer: Self-pay | Admitting: Emergency Medicine

## 2015-02-22 DIAGNOSIS — J40 Bronchitis, not specified as acute or chronic: Secondary | ICD-10-CM

## 2015-02-22 DIAGNOSIS — R05 Cough: Secondary | ICD-10-CM | POA: Diagnosis not present

## 2015-02-22 DIAGNOSIS — R059 Cough, unspecified: Secondary | ICD-10-CM

## 2015-02-22 DIAGNOSIS — I10 Essential (primary) hypertension: Secondary | ICD-10-CM

## 2015-02-22 MED ORDER — ALBUTEROL SULFATE (2.5 MG/3ML) 0.083% IN NEBU
INHALATION_SOLUTION | RESPIRATORY_TRACT | Status: AC
  Filled 2015-02-22: qty 3

## 2015-02-22 MED ORDER — AZITHROMYCIN 250 MG PO TABS: ORAL_TABLET | ORAL | Status: DC

## 2015-02-22 MED ORDER — ALBUTEROL SULFATE (2.5 MG/3ML) 0.083% IN NEBU
2.5000 mg | INHALATION_SOLUTION | RESPIRATORY_TRACT | Status: DC
  Administered 2015-02-22: 2.5 mg via RESPIRATORY_TRACT

## 2015-02-22 MED ORDER — ALBUTEROL SULFATE HFA 108 (90 BASE) MCG/ACT IN AERS: 2.0000 | INHALATION_SPRAY | RESPIRATORY_TRACT | Status: DC | PRN

## 2015-02-22 MED ORDER — BENZONATATE 100 MG PO CAPS: 200.0000 mg | ORAL_CAPSULE | Freq: Three times a day (TID) | ORAL | Status: DC | PRN

## 2015-02-22 MED ORDER — FUROSEMIDE 40 MG PO TABS
ORAL_TABLET | ORAL | Status: AC
  Filled 2015-02-22: qty 1

## 2015-02-22 MED ORDER — METHYLPREDNISOLONE 4 MG PO TBPK: ORAL_TABLET | ORAL | Status: DC

## 2015-02-22 MED ORDER — FUROSEMIDE 40 MG PO TABS
40.0000 mg | ORAL_TABLET | Freq: Every day | ORAL | Status: DC
  Administered 2015-02-22: 40 mg via ORAL

## 2015-02-22 NOTE — ED Provider Notes (Signed)
CSN: [id number]     Arrival date & time 02/22/15  1333 History   First MD Initiated Contact with Patient 02/22/15 1439     Chief Complaint  Patient presents with  . URI   (Consider location/radiation/quality/duration/timing/severity/associated sxs/prior Treatment) HPI Comments: She has run out of her HCTZ bp medicine.  She has been having SOB and persistent cough for over a week.  She has been rx'd SABA rescue MDI but is not sure where it is.  She is having a lot of night time sx's.  Patient is a 49 y.o. female presenting with URI. The history is provided by the patient.  URI Presenting symptoms: congestion and cough   Severity:  Moderate Onset quality:  Gradual Duration:  1 week Timing:  Intermittent Progression:  Worsening Chronicity:  Recurrent Relieved by:  Nothing Worsened by:  Nothing tried Ineffective treatments:  None tried Associated symptoms: wheezing     Past Medical History  Diagnosis Date  . Arteriosclerotic cardiovascular disease (ASCVD)     Minimal at cath in [facility].stress nuclear study in 8/08 with nl EF; neg stress echo in 2010  . Diabetes mellitus, type 2 ([place]) 2000    Onset in 2000; no insulin  . Hyperlipidemia   . Hypertension `    during treatment with Geodon  . Gastroesophageal reflux disease     Schatzki's ring  . Anemia, iron deficiency   . Alcohol abuse   . Depression   . Community acquired pneumonia 01/03/10, 05/2010, 04/2012    2011; with pleural effusion-[facility] acute resp failure; intubated in Jan 2014 (HMPV pneumonia)  . Obesity   . Schizoaffective disorder     requiring multiple psychiatric admissions  . Dysphagia   . Diastolic dysfunction     grade 2 per echo 2011  . Pulmonary hypertension ([place]) 05/02/2012    Patient needs repeat echo in 06/2012   . History of alcohol abuse 07/22/2007    Qualifier: Diagnosis of  By: Lenn Cal    . PTSD (post-traumatic stress disorder)    Past Surgical History  Procedure Laterality  Date  . Dilation and curettage, diagnostic / therapeutic  1992  . Esophagogastroduodenoscopy  09/16/08    Dr. Trevor Iha hiatal hernia/excoriations involving the cardia and mucosa consistent with trauma, antral erosions  of linear petechiae ? gastritis versus early gastric antral vascular  ectasia.Marland Kitchen biopsy showed reactive gastropathy. No H. pylori.  . Esophagogastroduodenoscopy  09/2007    Dr. Evalee Mutton ring, dilated to 92 French Maloney dilator, small hiatal hernia, antral erosions, biopsies reactive gastropathy.  Azzie Almas dilation  07/17/2011    [facility] sedation-->distal esophageal stricture s/p dilation, chronic gastritis, multiple ulcers in stomach. no h.pylori  . Colonoscopy  01/2006    internal hemorrhoids  . Colonoscopy  01/10/2012    Dr. Rourk:Single anal canal hemorrhoidal tag likely source of  trivial hematochezia; right-sided colonic diverticulosis  . Esophagogastroduodenoscopy (egd) with propofol N/A 12/17/2013    [id number] antral erosions and petechiae. Small hiatal hernia. No endoscopic explanation for patient's symptoms   Family History  Problem Relation Age of Onset  . Colon cancer Other   . Hypertension Mother   . Stroke Father     deceased at age 57  . Heart disease Sister   . Anesthesia problems Neg Hx   . Hypotension Neg Hx   . Malignant hyperthermia Neg Hx   . Pseudochol deficiency Neg Hx   . Diabetes    . High Cholesterol    . Arthritis  Social History  Substance Use Topics  . Smoking status: Current Every Day Smoker -- 1.00 packs/day for 30 years    Types: Cigarettes    Start date: 05/18/2012  . Smokeless tobacco: Never Used     Comment: cutting back,  . Alcohol Use: No     Comment: hx of ETOH abuse   OB History    Gravida Para Term Preterm AB TAB SAB Ectopic Multiple Living   3 2 2  1 1    2      Review of Systems  Constitutional: Negative.   HENT: Positive for congestion.   Eyes: Negative.   Respiratory: Positive for cough and  wheezing.   Gastrointestinal: Negative.   Endocrine: Negative.   Genitourinary: Negative.   Musculoskeletal: Negative.   Skin: Negative.   Allergic/Immunologic: Negative.   Neurological: Negative.   Hematological: Negative.   Psychiatric/Behavioral: Negative.     Allergies  Cephalexin; Metronidazole; Orange; Shrimp; Penicillins; Sulfonamide derivatives; Glipizide; and Sulfamethoxazole-trimethoprim  Home Medications   Prior to Admission medications   Medication Sig Start Date End Date Taking? Authorizing Provider  esomeprazole (NEXIUM) 40 MG capsule TAKE ONE CAPSULE BY MOUTH TWICE DAILY BEFORE A MEAL. 01/02/15  Yes Francesca Oman, DO  furosemide (LASIX) 20 MG tablet Take 1 tablet (20 mg total) by mouth daily. 07/13/14  Yes Norman Herrlich, MD  hydrochlorothiazide (HYDRODIURIL) 12.5 MG tablet Take 12.5 mg by mouth daily. 08/12/14  Yes Historical Provider, MD  LANTUS SOLOSTAR 100 UNIT/ML Solostar Pen INJECT 24 UNITS SUBCUTANEOUSLY AT BEDTIME. 12/22/14  Yes Francesca Oman, DO  lisinopril (PRINIVIL,ZESTRIL) 40 MG tablet Take 1 tablet (40 mg total) by mouth daily. 10/20/14  Yes Francesca Oman, DO  loratadine (CLARITIN) 10 MG tablet Take 1 tablet (10 mg total) by mouth daily. 09/15/14  Yes Alex Ronnie Derby, DO  lovastatin (MEVACOR) 20 MG tablet Take 1 tablet (20 mg total) by mouth at bedtime. 05/05/14  Yes Norman Herrlich, MD  metFORMIN (GLUCOPHAGE) 1000 MG tablet TAKE (1) TABLET BY MOUTH TWICE DAILY WITH MEALS. 11/01/14  Yes Francesca Oman, DO  metoprolol (LOPRESSOR) 100 MG tablet Take 1 tablet (100 mg total) by mouth 2 (two) times daily. 10/20/14  Yes Francesca Oman, DO  topiramate (TOPAMAX) 50 MG tablet Take 1 tablet (50 mg total) by mouth 2 (two) times daily. 11/25/14  Yes Melvenia Beam, MD  ACCU-CHEK FASTCLIX LANCETS MISC Use to check blood sugar 3 to 4 times daily. diag code E11.9. Insulin dependent 09/20/14   Bartholomew Crews, MD  albuterol (PROVENTIL HFA;VENTOLIN HFA) 108 (90 BASE) MCG/ACT inhaler Inhale 2  puffs into the lungs every 4 (four) hours as needed for wheezing or shortness of breath. 02/22/15   Lysbeth Penner, FNP  azithromycin (ZITHROMAX) 250 MG tablet Take 2 po first day and then one po qd x 4 days 02/22/15   Lysbeth Penner, FNP  benzonatate (TESSALON) 100 MG capsule Take 2 capsules (200 mg total) by mouth 3 (three) times daily as needed for cough. 02/22/15   Lysbeth Penner, FNP  busPIRone (BUSPAR) 5 MG tablet Take 5 mg by mouth 2 (two) times daily. 12/15/14   Historical Provider, MD  glucose blood (ACCU-CHEK SMARTVIEW) test strip Use to check blood sugar 3 to 4 times daily. diag code E11.9. Insulin dependent. 09/20/14   Bartholomew Crews, MD  ibuprofen (ADVIL,MOTRIN) 200 MG tablet Take 400 mg by mouth every 6 (six) hours as needed for mild pain.  Historical Provider, MD  methylPREDNISolone (MEDROL DOSEPAK) 4 MG TBPK tablet Use as directed 02/22/15   Lysbeth Penner, FNP  mometasone (NASONEX) 50 MCG/ACT nasal spray Place 2 sprays into the nose 2 (two) times daily. 02/22/14   Nino Glow McLean-Scocozza, MD  nitroGLYCERIN (NITROSTAT) 0.3 MG SL tablet Place 1 tablet (0.3 mg total) under the tongue every 5 (five) minutes as needed for chest pain. Patient not taking: Reported on 12/22/2014 12/09/14   Lendon Colonel, NP  NOVOFINE 32G X 6 MM MISC Use to inject insulin one time daily. Dx: E11.65 12/30/14   Francesca Oman, DO  potassium chloride SA (K-DUR,KLOR-CON) 20 MEQ tablet Take 20 mEq by mouth daily.    Historical Provider, MD  ziprasidone (GEODON) 80 MG capsule Take 160 mg by mouth daily.    Historical Provider, MD   Meds Ordered and Administered this Visit   Medications  albuterol (PROVENTIL) (2.5 MG/3ML) 0.083% nebulizer solution 2.5 mg (not administered)    BP 186/100 mmHg  Pulse 76  Temp(Src) 99.1 F (37.3 C) (Oral)  Resp 16  SpO2 98%  LMP 12/21/2014 No data found.   Physical Exam  Constitutional: She appears well-developed and well-nourished.  HENT:  Head: Normocephalic.   Right Ear: External ear normal.  Left Ear: External ear normal.  Nose: Nose normal.  Mouth/Throat: Oropharynx is clear and moist.  Eyes: Conjunctivae and EOM are normal. Pupils are equal, round, and reactive to light.  Neck: Normal range of motion. Neck supple.  Cardiovascular: Normal rate, regular rhythm and normal heart sounds.   Pulmonary/Chest: Effort normal. She has wheezes.  Abdominal: Soft. Bowel sounds are normal.    ED Course  Procedures (including critical care time)  Labs Review Labs Reviewed - No data to display  Imaging Review No results found.   Visual Acuity Review  Right Eye Distance:   Left Eye Distance:   Bilateral Distance:    Right Eye Near:   Left Eye Near:    Bilateral Near:         MDM   1. Bronchitis   2. Cough   3. HTN - Take lasix 40mg  one po qd and patient has taken in past w/o difficulty and follow up with PCP tomorrow.  Lasix 40mg  now Neb treatment albuterol 2.5mg /3 ml's now Medrol dose pack as directed Zpak as directed Tessalon Perles 100mg  one po tid prn #30    Lysbeth Penner, FNP 02/22/15 [place], [place] 02/22/15 1527

## 2015-02-22 NOTE — ED Notes (Signed)
C/o cold sx onset Tuesday Sx include dry cough, vomiting due to cough, fever, BA A&O x4... No acute distress.

## 2015-03-14 ENCOUNTER — Other Ambulatory Visit: Payer: Self-pay | Admitting: Internal Medicine

## 2015-03-14 NOTE — Telephone Encounter (Signed)
NEEDS REFILL OF NEXNUM

## 2015-03-15 MED ORDER — ESOMEPRAZOLE MAGNESIUM 40 MG PO CPDR: DELAYED_RELEASE_CAPSULE | ORAL | Status: DC

## 2015-03-22 ENCOUNTER — Other Ambulatory Visit: Payer: Self-pay | Admitting: Internal Medicine

## 2015-04-16 ENCOUNTER — Encounter (HOSPITAL_COMMUNITY): Payer: Self-pay | Admitting: Emergency Medicine

## 2015-04-16 ENCOUNTER — Emergency Department (HOSPITAL_COMMUNITY)
Admission: EM | Admit: 2015-04-16 | Discharge: 2015-04-16 | Disposition: A | Discharge status: Home / Self Care | Payer: Medicare Other | Attending: Emergency Medicine | Admitting: Emergency Medicine

## 2015-04-16 DIAGNOSIS — Z794 Long term (current) use of insulin: Secondary | ICD-10-CM | POA: Insufficient documentation

## 2015-04-16 DIAGNOSIS — K219 Gastro-esophageal reflux disease without esophagitis: Secondary | ICD-10-CM | POA: Insufficient documentation

## 2015-04-16 DIAGNOSIS — Z7951 Long term (current) use of inhaled steroids: Secondary | ICD-10-CM | POA: Diagnosis not present

## 2015-04-16 DIAGNOSIS — Z7984 Long term (current) use of oral hypoglycemic drugs: Secondary | ICD-10-CM | POA: Insufficient documentation

## 2015-04-16 DIAGNOSIS — Z8701 Personal history of pneumonia (recurrent): Secondary | ICD-10-CM | POA: Insufficient documentation

## 2015-04-16 DIAGNOSIS — F329 Major depressive disorder, single episode, unspecified: Secondary | ICD-10-CM | POA: Diagnosis not present

## 2015-04-16 DIAGNOSIS — I2510 Atherosclerotic heart disease of native coronary artery without angina pectoris: Secondary | ICD-10-CM | POA: Insufficient documentation

## 2015-04-16 DIAGNOSIS — Z79899 Other long term (current) drug therapy: Secondary | ICD-10-CM | POA: Diagnosis not present

## 2015-04-16 DIAGNOSIS — I10 Essential (primary) hypertension: Secondary | ICD-10-CM | POA: Insufficient documentation

## 2015-04-16 DIAGNOSIS — Z862 Personal history of diseases of the blood and blood-forming organs and certain disorders involving the immune mechanism: Secondary | ICD-10-CM | POA: Insufficient documentation

## 2015-04-16 DIAGNOSIS — R51 Headache: Secondary | ICD-10-CM | POA: Diagnosis not present

## 2015-04-16 DIAGNOSIS — I519 Heart disease, unspecified: Secondary | ICD-10-CM | POA: Insufficient documentation

## 2015-04-16 DIAGNOSIS — F17210 Nicotine dependence, cigarettes, uncomplicated: Secondary | ICD-10-CM | POA: Diagnosis not present

## 2015-04-16 DIAGNOSIS — E119 Type 2 diabetes mellitus without complications: Secondary | ICD-10-CM | POA: Insufficient documentation

## 2015-04-16 DIAGNOSIS — F259 Schizoaffective disorder, unspecified: Secondary | ICD-10-CM | POA: Insufficient documentation

## 2015-04-16 DIAGNOSIS — E669 Obesity, unspecified: Secondary | ICD-10-CM | POA: Insufficient documentation

## 2015-04-16 DIAGNOSIS — Z880 Allergy status to penicillin: Secondary | ICD-10-CM | POA: Insufficient documentation

## 2015-04-16 DIAGNOSIS — R112 Nausea with vomiting, unspecified: Secondary | ICD-10-CM | POA: Diagnosis not present

## 2015-04-16 DIAGNOSIS — E785 Hyperlipidemia, unspecified: Secondary | ICD-10-CM | POA: Insufficient documentation

## 2015-04-16 LAB — CBC WITH DIFFERENTIAL/PLATELET
BASOS ABS: 0 10*3/uL (ref 0.0–0.1)
BASOS PCT: 0 %
EOS PCT: 0 %
Eosinophils Absolute: 0 10*3/uL (ref 0.0–0.7)
HEMATOCRIT: 41.4 % (ref 36.0–46.0)
Hemoglobin: 14.1 g/dL (ref 12.0–15.0)
Lymphocytes Relative: 31 %
Lymphs Abs: 3.4 10*3/uL (ref 0.7–4.0)
MCH: 28.9 pg (ref 26.0–34.0)
MCHC: 34.1 g/dL (ref 30.0–36.0)
MCV: 84.8 fL (ref 78.0–100.0)
MONO ABS: 0.2 10*3/uL (ref 0.1–1.0)
Monocytes Relative: 2 %
NEUTROS ABS: 7.3 10*3/uL (ref 1.7–7.7)
Neutrophils Relative %: 67 %
PLATELETS: 258 10*3/uL (ref 150–400)
RBC: 4.88 MIL/uL (ref 3.87–5.11)
RDW: 16.1 % — AB (ref 11.5–15.5)
WBC: 11 10*3/uL — ABNORMAL HIGH (ref 4.0–10.5)

## 2015-04-16 LAB — I-STAT CHEM 8, ED
BUN: 9 mg/dL (ref 6–20)
CALCIUM ION: 1.18 mmol/L (ref 1.12–1.23)
Chloride: 101 mmol/L (ref 101–111)
Creatinine, Ser: 0.7 mg/dL (ref 0.44–1.00)
Glucose, Bld: 175 mg/dL — ABNORMAL HIGH (ref 65–99)
HCT: 47 % — ABNORMAL HIGH (ref 36.0–46.0)
Hemoglobin: 16 g/dL — ABNORMAL HIGH (ref 12.0–15.0)
Potassium: 3.3 mmol/L — ABNORMAL LOW (ref 3.5–5.1)
SODIUM: 140 mmol/L (ref 135–145)
TCO2: 23 mmol/L (ref 0–100)

## 2015-04-16 LAB — COMPREHENSIVE METABOLIC PANEL
ALBUMIN: 4.4 g/dL (ref 3.5–5.0)
ALT: 127 U/L — AB (ref 14–54)
ANION GAP: 12 (ref 5–15)
AST: 22 U/L (ref 15–41)
Alkaline Phosphatase: 98 U/L (ref 38–126)
BILIRUBIN TOTAL: 0.3 mg/dL (ref 0.3–1.2)
BUN: 11 mg/dL (ref 6–20)
CALCIUM: 9.7 mg/dL (ref 8.9–10.3)
CO2: 24 mmol/L (ref 22–32)
CREATININE: 0.79 mg/dL (ref 0.44–1.00)
Chloride: 104 mmol/L (ref 101–111)
GFR calc Af Amer: >60 mL/min (ref >60)
GFR calc non Af Amer: >60 mL/min (ref >60)
GLUCOSE: 175 mg/dL — AB (ref 65–99)
Potassium: 3.5 mmol/L (ref 3.5–5.1)
Sodium: 140 mmol/L (ref 135–145)
TOTAL PROTEIN: 7.5 g/dL (ref 6.5–8.1)

## 2015-04-16 LAB — URINE MICROSCOPIC-ADD ON

## 2015-04-16 LAB — URINALYSIS, ROUTINE W REFLEX MICROSCOPIC
Bilirubin Urine: NEGATIVE
Glucose, UA: NEGATIVE mg/dL
KETONES UR: 15 mg/dL — AB
LEUKOCYTES UA: NEGATIVE
NITRITE: NEGATIVE
PROTEIN: 30 mg/dL — AB
Specific Gravity, Urine: 1.025 (ref 1.005–1.030)
pH: 6 (ref 5.0–8.0)

## 2015-04-16 LAB — LIPASE, BLOOD: LIPASE: 17 U/L (ref 11–51)

## 2015-04-16 LAB — ETHANOL: Alcohol, Ethyl (B): <5 mg/dL (ref <5)

## 2015-04-16 MED ORDER — SODIUM CHLORIDE 0.9 % IV BOLUS (SEPSIS)
2000.0000 mL | Freq: Once | INTRAVENOUS | Status: AC
Start: 2015-04-16 — End: 2015-04-16
  Administered 2015-04-16: 2000 mL via INTRAVENOUS

## 2015-04-16 MED ORDER — ONDANSETRON HCL 8 MG PO TABS: 8.0000 mg | ORAL_TABLET | Freq: Three times a day (TID) | ORAL | Status: DC | PRN

## 2015-04-16 MED ORDER — SODIUM CHLORIDE 0.9 % IV SOLN
INTRAVENOUS | Status: DC
  Administered 2015-04-16: 21:00:00 via INTRAVENOUS

## 2015-04-16 MED ORDER — ACETAMINOPHEN 325 MG PO TABS
650.0000 mg | ORAL_TABLET | Freq: Once | ORAL | Status: AC
  Administered 2015-04-16: 650 mg via ORAL
  Filled 2015-04-16: qty 2

## 2015-04-16 MED ORDER — ONDANSETRON HCL 4 MG/2ML IJ SOLN
4.0000 mg | Freq: Once | INTRAMUSCULAR | Status: AC
  Administered 2015-04-16: 4 mg via INTRAVENOUS
  Filled 2015-04-16: qty 2

## 2015-04-16 MED ORDER — ONDANSETRON HCL 4 MG PO TABS: 4.0000 mg | ORAL_TABLET | Freq: Three times a day (TID) | ORAL | Status: DC | PRN

## 2015-04-16 NOTE — Discharge Instructions (Signed)

## 2015-04-16 NOTE — ED Notes (Signed)
Pt reports headache and emesis since yesterday. NAD noted.

## 2015-04-16 NOTE — ED Notes (Signed)
Pt ambulatory to bathroom. Urine specimen requested.

## 2015-04-16 NOTE — ED Provider Notes (Signed)
CSN: [id number]     Arrival date & time 04/16/15  1841 History   First MD Initiated Contact with Patient 04/16/15 1853     Chief Complaint  Patient presents with  . Emesis     (Consider location/radiation/quality/duration/timing/severity/associated sxs/prior Treatment) HPI   [name] is a 49 y.o. female who presents for evaluation of headache, nausea and vomiting, which started yesterday. She has been unable to take her antihypertensive medication. Is continuing to take her insulin. She denies fever, chills, back pain, weakness or dizziness. No known sick contacts. She thinks she may have eaten some food that had mold on it as a source for the vomiting. There are no other known modifying factors.   Past Medical History  Diagnosis Date  . Arteriosclerotic cardiovascular disease (ASCVD)     Minimal at cath in [facility].stress nuclear study in 8/08 with nl EF; neg stress echo in 2010  . Diabetes mellitus, type 2 ([place]) 2000    Onset in 2000; no insulin  . Hyperlipidemia   . Hypertension `    during treatment with Geodon  . Gastroesophageal reflux disease     Schatzki's ring  . Anemia, iron deficiency   . Alcohol abuse   . Depression   . Community acquired pneumonia 01/03/10, 05/2010, 04/2012    2011; with pleural effusion-[facility] acute resp failure; intubated in Jan 2014 (HMPV pneumonia)  . Obesity   . Schizoaffective disorder     requiring multiple psychiatric admissions  . Dysphagia   . Diastolic dysfunction     grade 2 per echo 2011  . Pulmonary hypertension ([place]) 05/02/2012    Patient needs repeat echo in 06/2012   . History of alcohol abuse 07/22/2007    Qualifier: Diagnosis of  By: Lenn Cal    . PTSD (post-traumatic stress disorder)    Past Surgical History  Procedure Laterality Date  . Dilation and curettage, diagnostic / therapeutic  1992  . Esophagogastroduodenoscopy  09/16/08    Dr. Trevor Iha hiatal hernia/excoriations involving the cardia  and mucosa consistent with trauma, antral erosions  of linear petechiae ? gastritis versus early gastric antral vascular  ectasia.Marland Kitchen biopsy showed reactive gastropathy. No H. pylori.  . Esophagogastroduodenoscopy  09/2007    Dr. Evalee Mutton ring, dilated to 22 French Maloney dilator, small hiatal hernia, antral erosions, biopsies reactive gastropathy.  Azzie Almas dilation  07/17/2011    [facility] sedation-->distal esophageal stricture s/p dilation, chronic gastritis, multiple ulcers in stomach. no h.pylori  . Colonoscopy  01/2006    internal hemorrhoids  . Colonoscopy  01/10/2012    Dr. Rourk:Single anal canal hemorrhoidal tag likely source of  trivial hematochezia; right-sided colonic diverticulosis  . Esophagogastroduodenoscopy (egd) with propofol N/A 12/17/2013    [id number] antral erosions and petechiae. Small hiatal hernia. No endoscopic explanation for patient's symptoms   Family History  Problem Relation Age of Onset  . Colon cancer Other   . Hypertension Mother   . Stroke Father     deceased at age 41  . Heart disease Sister   . Anesthesia problems Neg Hx   . Hypotension Neg Hx   . Malignant hyperthermia Neg Hx   . Pseudochol deficiency Neg Hx   . Diabetes    . High Cholesterol    . Arthritis     Social History  Substance Use Topics  . Smoking status: Current Every Day Smoker -- 1.00 packs/day for 30 years    Types: Cigarettes    Start date: 05/18/2012  .  Smokeless tobacco: Never Used     Comment: cutting back,  . Alcohol Use: No     Comment: hx of ETOH abuse   OB History    Gravida Para Term Preterm AB TAB SAB Ectopic Multiple Living   3 2 2  1 1    2      Review of Systems  All other systems reviewed and are negative.     Allergies  Cephalexin; Metronidazole; Orange; Shrimp; Penicillins; Sulfonamide derivatives; Glipizide; and Sulfamethoxazole-trimethoprim  Home Medications   Prior to Admission medications   Medication Sig Start Date End Date Taking?  Authorizing Provider  acetaminophen (TYLENOL) 500 MG tablet Take 1,000 mg by mouth every 6 (six) hours as needed for mild pain.   Yes Historical Provider, MD  albuterol (PROVENTIL HFA;VENTOLIN HFA) 108 (90 BASE) MCG/ACT inhaler Inhale 2 puffs into the lungs every 4 (four) hours as needed for wheezing or shortness of breath. 02/22/15  Yes Lysbeth Penner, FNP  busPIRone (BUSPAR) 5 MG tablet Take 5 mg by mouth 2 (two) times daily. 12/15/14  Yes Historical Provider, MD  esomeprazole (NEXIUM) 40 MG capsule TAKE ONE CAPSULE BY MOUTH TWICE DAILY BEFORE A MEAL. 03/15/15  Yes Alex Ronnie Derby, DO  furosemide (LASIX) 20 MG tablet TAKE 1 TABLET BY MOUTH ONCE DAILY. 03/24/15  Yes Alex Ronnie Derby, DO  hydrochlorothiazide (HYDRODIURIL) 12.5 MG tablet Take 12.5 mg by mouth daily. 08/12/14  Yes Historical Provider, MD  ibuprofen (ADVIL,MOTRIN) 200 MG tablet Take 400 mg by mouth every 6 (six) hours as needed for mild pain.   Yes Historical Provider, MD  LANTUS SOLOSTAR 100 UNIT/ML Solostar Pen INJECT 24 UNITS SUBCUTANEOUSLY AT BEDTIME. Patient taking differently: INJECT 16 UNITS SUBCUTANEOUSLY AT BEDTIME. 12/22/14  Yes Francesca Oman, DO  lisinopril (PRINIVIL,ZESTRIL) 40 MG tablet Take 1 tablet (40 mg total) by mouth daily. 10/20/14  Yes Francesca Oman, DO  loratadine (CLARITIN) 10 MG tablet Take 1 tablet (10 mg total) by mouth daily. 09/15/14  Yes Alex Ronnie Derby, DO  lovastatin (MEVACOR) 20 MG tablet TAKE (1) TABLET BY MOUTH AT BEDTIME. 03/16/15  Yes Francesca Oman, DO  metFORMIN (GLUCOPHAGE) 1000 MG tablet TAKE (1) TABLET BY MOUTH TWICE DAILY WITH MEALS. 11/01/14  Yes Francesca Oman, DO  metoprolol (LOPRESSOR) 100 MG tablet Take 1 tablet (100 mg total) by mouth 2 (two) times daily. 10/20/14  Yes Francesca Oman, DO  nitroGLYCERIN (NITROSTAT) 0.4 MG SL tablet Place 0.4 mg under the tongue every 5 (five) minutes as needed for chest pain.   Yes Historical Provider, MD  potassium chloride SA (K-DUR,KLOR-CON) 20 MEQ tablet Take 20 mEq by  mouth daily.   Yes Historical Provider, MD  ziprasidone (GEODON) 80 MG capsule Take 160 mg by mouth daily.   Yes Historical Provider, MD  ACCU-CHEK FASTCLIX LANCETS MISC Use to check blood sugar 3 to 4 times daily. diag code E11.9. Insulin dependent 09/20/14   Bartholomew Crews, MD  azithromycin (ZITHROMAX) 250 MG tablet Take 2 po first day and then one po qd x 4 days 02/22/15   Lysbeth Penner, FNP  benzonatate (TESSALON) 100 MG capsule Take 2 capsules (200 mg total) by mouth 3 (three) times daily as needed for cough. 02/22/15   Lysbeth Penner, FNP  glucose blood (ACCU-CHEK SMARTVIEW) test strip Use to check blood sugar 3 to 4 times daily. diag code E11.9. Insulin dependent. 09/20/14   Bartholomew Crews, MD  mometasone (NASONEX) 50 MCG/ACT nasal spray  Place 2 sprays into the nose 2 (two) times daily. Patient taking differently: Place 2 sprays into the nose 2 (two) times daily as needed (Congestion).  02/22/14   Nino Glow McLean-Scocozza, MD  nitroGLYCERIN (NITROSTAT) 0.3 MG SL tablet Place 1 tablet (0.3 mg total) under the tongue every 5 (five) minutes as needed for chest pain. 12/09/14   Lendon Colonel, NP  NOVOFINE 32G X 6 MM MISC Use to inject insulin one time daily. Dx: E11.65 12/30/14   Francesca Oman, DO  ondansetron (ZOFRAN) 4 MG tablet Take 1 tablet (4 mg total) by mouth every 8 (eight) hours as needed for nausea or vomiting. 04/16/15   Daleen Bo, MD  ondansetron (ZOFRAN) 8 MG tablet Take 1 tablet (8 mg total) by mouth every 8 (eight) hours as needed for nausea or vomiting. 04/16/15   Daleen Bo, MD  topiramate (TOPAMAX) 50 MG tablet Take 1 tablet (50 mg total) by mouth 2 (two) times daily. 11/25/14   Melvenia Beam, MD   BP 174/92 mmHg  Pulse 83  Temp(Src) 98.2 F (36.8 C) (Oral)  Resp 16  Ht 5\' 4"  (1.626 m)  Wt 186 lb (84.369 kg)  BMI 31.91 kg/m2  SpO2 99%  LMP 04/16/2015 Physical Exam  Constitutional: She is oriented to person, place, and time. She appears well-developed  and well-nourished.  HENT:  Head: Normocephalic and atraumatic.  Right Ear: External ear normal.  Left Ear: External ear normal.  Eyes: Conjunctivae and EOM are normal. Pupils are equal, round, and reactive to light.  Neck: Normal range of motion and phonation normal. Neck supple.  Cardiovascular: Normal rate, regular rhythm and normal heart sounds.   Pulmonary/Chest: Effort normal and breath sounds normal. She exhibits no bony tenderness.  Abdominal: Soft. She exhibits no distension. There is no tenderness.  Musculoskeletal: Normal range of motion.  Neurological: She is alert and oriented to person, place, and time. No cranial nerve deficit or sensory deficit. She exhibits normal muscle tone. Coordination normal.  Skin: Skin is warm, dry and intact.  Psychiatric: She has a normal mood and affect. Her behavior is normal. Judgment and thought content normal.  Nursing note and vitals reviewed.   ED Course  Procedures (including critical care time)   Medications  sodium chloride 0.9 % bolus 2,000 mL (0 mLs Intravenous Stopped 04/16/15 2050)  ondansetron (ZOFRAN) injection 4 mg (4 mg Intravenous Given 04/16/15 1909)  acetaminophen (TYLENOL) tablet 650 mg (650 mg Oral Given 04/16/15 2038)    Patient Vitals for the past 24 hrs:  BP Temp Temp src Pulse Resp SpO2 Height Weight  04/16/15 2232 174/92 mmHg 98.2 F (36.8 C) Oral 83 16 99 % - -  04/16/15 2200 164/95 mmHg - - 89 - 96 % - -  04/16/15 2130 172/90 mmHg - - 86 - 98 % - -  04/16/15 2100 175/95 mmHg - - 91 - 100 % - -  04/16/15 2030 164/91 mmHg - - 82 - 97 % - -  04/16/15 2000 (!) 169/106 mmHg - - 74 - 93 % - -  04/16/15 1930 180/93 mmHg - - 71 - 100 % - -  04/16/15 1912 165/91 mmHg - - 85 14 100 % - -  04/16/15 1906 165/91 mmHg - - 77 - 100 % - -  04/16/15 1853 (!) 207/106 mmHg 98.3 F (36.8 C) Oral 81 14 100 % 5\' 4"  (1.626 m) 186 lb (84.369 kg)    At D/C Reevaluation with update and  discussion. After initial assessment and  treatment, an updated evaluation reveals she feels better, tolerating oral fluids. Lavera Vandermeer L    Labs Review Labs Reviewed  COMPREHENSIVE METABOLIC PANEL - Abnormal; Notable for the following:    Glucose, Bld 175 (*)    ALT 127 (*)    All other components within normal limits  CBC WITH DIFFERENTIAL/PLATELET - Abnormal; Notable for the following:    WBC 11.0 (*)    RDW 16.1 (*)    All other components within normal limits  URINALYSIS, ROUTINE W REFLEX MICROSCOPIC (NOT AT [facility]) - Abnormal; Notable for the following:    Hgb urine dipstick SMALL (*)    Ketones, ur 15 (*)    Protein, ur 30 (*)    All other components within normal limits  URINE MICROSCOPIC-ADD ON - Abnormal; Notable for the following:    Squamous Epithelial / LPF 0-5 (*)    Bacteria, UA RARE (*)    All other components within normal limits  I-STAT CHEM 8, ED - Abnormal; Notable for the following:    Potassium 3.3 (*)    Glucose, Bld 175 (*)    Hemoglobin 16.0 (*)    HCT 47.0 (*)    All other components within normal limits  ETHANOL  LIPASE, BLOOD    Imaging Review No results found. I have personally reviewed and evaluated these images and lab results as part of my medical decision-making.   EKG Interpretation None      MDM   Final diagnoses:  Non-intractable vomiting with nausea, vomiting of unspecified type    Nonspecific N/V, improved with treatment. Incidental HTN, spontaneously improved. Doubt SBI, metabolic instability or impending vascular collapse.  Nursing Notes Reviewed/ Care Coordinated Applicable Imaging Reviewed Interpretation of Laboratory Data incorporated into ED treatment  The patient appears reasonably screened and/or stabilized for discharge and I doubt any other medical condition or other [facility] requiring further screening, evaluation, or treatment in the ED at this time prior to discharge.  Plan: Home Medications- usual and Zofran; Home Treatments- Rest, gradually advance diet;  return here if the recommended treatment, does not improve the symptoms; Recommended follow up- PCP prn     Daleen Bo, MD 04/17/15 1118

## 2015-04-21 ENCOUNTER — Other Ambulatory Visit: Payer: Self-pay | Admitting: Internal Medicine

## 2015-04-21 MED ORDER — LORATADINE 10 MG PO TABS: 10.0000 mg | ORAL_TABLET | Freq: Every day | ORAL | Status: DC

## 2015-04-21 MED ORDER — ESOMEPRAZOLE MAGNESIUM 40 MG PO CPDR: 40.0000 mg | DELAYED_RELEASE_CAPSULE | Freq: Every day | ORAL | Status: DC

## 2015-04-21 NOTE — Telephone Encounter (Signed)
Pt requesting all meds to be filled. °

## 2015-05-04 ENCOUNTER — Other Ambulatory Visit: Payer: Self-pay | Admitting: Internal Medicine

## 2015-05-04 NOTE — Telephone Encounter (Signed)
Last script has expired

## 2015-05-04 NOTE — Telephone Encounter (Signed)
Pt requesting test strips and Lancets to be filled @ [facility] in [place].

## 2015-05-05 MED ORDER — ACCU-CHEK FASTCLIX LANCETS MISC: Status: DC

## 2015-05-05 MED ORDER — GLUCOSE BLOOD VI STRP: ORAL_STRIP | Status: DC

## 2015-05-06 ENCOUNTER — Other Ambulatory Visit: Payer: Self-pay | Admitting: *Deleted

## 2015-05-06 MED ORDER — GLUCOSE BLOOD VI STRP: ORAL_STRIP | Status: DC

## 2015-05-06 MED ORDER — ACCU-CHEK FASTCLIX LANCETS MISC: Status: DC

## 2015-05-06 NOTE — Telephone Encounter (Signed)
Went to wrong pharmacy, please resend to [facility], i would call it in but for medicare they have to have paper

## 2015-05-09 ENCOUNTER — Other Ambulatory Visit: Payer: Self-pay | Admitting: Internal Medicine

## 2015-05-09 NOTE — Telephone Encounter (Signed)
Last refill 04/21/15 Pt request increase # per month as she takes it twice a day.

## 2015-05-09 NOTE — Telephone Encounter (Signed)
Pt states she is taking Nexium twice daily and not once a day. Requesting Nexium to be filled @ [facility]

## 2015-05-10 ENCOUNTER — Telehealth: Payer: Self-pay | Admitting: Internal Medicine

## 2015-05-10 NOTE — Telephone Encounter (Signed)
Reminder call pt has appt 05/11/15, no answer/LMTCB

## 2015-05-11 ENCOUNTER — Encounter: Payer: Self-pay | Admitting: Internal Medicine

## 2015-05-11 ENCOUNTER — Ambulatory Visit (INDEPENDENT_AMBULATORY_CARE_PROVIDER_SITE_OTHER): Payer: Medicare Other | Admitting: Internal Medicine

## 2015-05-11 VITALS — BP 131/65 | HR 75 | Temp 98.6°F | Ht 64.0 in | Wt 184.3 lb

## 2015-05-11 DIAGNOSIS — Z79899 Other long term (current) drug therapy: Secondary | ICD-10-CM

## 2015-05-11 DIAGNOSIS — Z6838 Body mass index (BMI) 38.0-38.9, adult: Secondary | ICD-10-CM | POA: Diagnosis not present

## 2015-05-11 DIAGNOSIS — E119 Type 2 diabetes mellitus without complications: Secondary | ICD-10-CM

## 2015-05-11 DIAGNOSIS — D509 Iron deficiency anemia, unspecified: Secondary | ICD-10-CM

## 2015-05-11 DIAGNOSIS — K219 Gastro-esophageal reflux disease without esophagitis: Secondary | ICD-10-CM | POA: Diagnosis not present

## 2015-05-11 DIAGNOSIS — E669 Obesity, unspecified: Secondary | ICD-10-CM | POA: Diagnosis not present

## 2015-05-11 DIAGNOSIS — Z794 Long term (current) use of insulin: Secondary | ICD-10-CM

## 2015-05-11 DIAGNOSIS — I10 Essential (primary) hypertension: Secondary | ICD-10-CM | POA: Diagnosis not present

## 2015-05-11 DIAGNOSIS — E785 Hyperlipidemia, unspecified: Secondary | ICD-10-CM | POA: Diagnosis not present

## 2015-05-11 DIAGNOSIS — Z7984 Long term (current) use of oral hypoglycemic drugs: Secondary | ICD-10-CM

## 2015-05-11 DIAGNOSIS — F319 Bipolar disorder, unspecified: Secondary | ICD-10-CM

## 2015-05-11 DIAGNOSIS — R112 Nausea with vomiting, unspecified: Secondary | ICD-10-CM

## 2015-05-11 DIAGNOSIS — F17200 Nicotine dependence, unspecified, uncomplicated: Secondary | ICD-10-CM | POA: Diagnosis not present

## 2015-05-11 DIAGNOSIS — J302 Other seasonal allergic rhinitis: Secondary | ICD-10-CM

## 2015-05-11 LAB — POCT GLYCOSYLATED HEMOGLOBIN (HGB A1C): Hemoglobin A1C: 6.7

## 2015-05-11 LAB — GLUCOSE, CAPILLARY: Glucose-Capillary: 158 mg/dL — ABNORMAL HIGH (ref 65–99)

## 2015-05-11 MED ORDER — METOPROLOL TARTRATE 100 MG PO TABS: 100.0000 mg | ORAL_TABLET | Freq: Two times a day (BID) | ORAL | Status: DC

## 2015-05-11 MED ORDER — POTASSIUM CHLORIDE CRYS ER 20 MEQ PO TBCR: 20.0000 meq | EXTENDED_RELEASE_TABLET | Freq: Every day | ORAL | Status: DC

## 2015-05-11 MED ORDER — LISINOPRIL 40 MG PO TABS: 40.0000 mg | ORAL_TABLET | Freq: Every day | ORAL | Status: DC

## 2015-05-11 MED ORDER — FUROSEMIDE 20 MG PO TABS: 20.0000 mg | ORAL_TABLET | Freq: Every day | ORAL | Status: DC

## 2015-05-11 NOTE — Assessment & Plan Note (Addendum)
BP Readings from Last 3 Encounters:  05/11/15 131/65  04/16/15 174/92  02/22/15 186/100    Lab Results  Component Value Date   NA 140 04/16/2015   K 3.3* 04/16/2015   CREATININE 0.70 04/16/2015    Assessment: Blood pressure control:   well controlled Progress toward BP goal:   at goal Comments:  Lisinopril 40mg  daily, lopressor 100mg  BID, lasix 20mg  daily.  She does not have HCTZ with her and says she thought this was stopped.  Plan: Medications:  continue current medications:  Lisinopril 40mg  daily, metoprolol 100mg  BID and lasix 20mg  daily.  Ok to d/c HCTZ for now since BP ok and K was slightly low last check. Educational resources provided: brochure (denies) Self management tools provided:   Other plans:  CMP today.  Follow-up BP next visit.

## 2015-05-11 NOTE — Patient Instructions (Signed)
1. You are doing a great job with weight loss and controlling diabetes.  Please continue Lantus 16 units daily.  Check your blood sugar every morning before eating and every evening.  If you morning blood sugars are below 80 please decrease your Lantus by 2 units.  If you are having to decrease a lot please call me.     2. Please take all medications as prescribed.    3. If you have worsening of your symptoms or new symptoms arise, please call the clinic [phone]), or go to the ER immediately if symptoms are severe.   Please return to clinic in 5 months (June 2017) or sooner if you have problems.

## 2015-05-11 NOTE — Progress Notes (Signed)
Subjective:    Patient ID: [name], female    DOB: February 16, 1966, 50 y.o.   MRN: [id number]  HPI Comments: [name] is a 50 year old woman with PMH as below here for follow-up of diabetes.  Please see problem based charting for status of her chronic conditions.    Past Medical History  Diagnosis Date  . Arteriosclerotic cardiovascular disease (ASCVD)     Minimal at cath in  [facility].stress nuclear study in 8/08 with nl EF; neg stress echo in 2010  . Diabetes mellitus, type 2 ([place]) 2000    Onset in 2000; no insulin  . Hyperlipidemia   . Hypertension `    during treatment with Geodon  . Gastroesophageal reflux disease     Schatzki's ring  . Anemia, iron deficiency   . Alcohol abuse   . Depression   . Community acquired pneumonia 01/03/10, 05/2010, 04/2012    2011; with pleural effusion-[facility] acute resp failure; intubated in Jan 2014 (HMPV pneumonia)  . Obesity   . Schizoaffective disorder     requiring multiple psychiatric admissions  . Dysphagia   . Diastolic dysfunction     grade 2 per echo 2011  . Pulmonary hypertension ([place]) 05/02/2012    Patient needs repeat echo in 06/2012   . History of alcohol abuse 07/22/2007    Qualifier: Diagnosis of  By: Lenn Cal    . PTSD (post-traumatic stress disorder)    Current Outpatient Prescriptions on File Prior to Visit  Medication Sig Dispense Refill  . albuterol (PROVENTIL HFA;VENTOLIN HFA) 108 (90 BASE) MCG/ACT inhaler Inhale 2 puffs into the lungs every 4 (four) hours as needed for wheezing or shortness of breath. 1 Inhaler 0  . esomeprazole (NEXIUM) 40 MG capsule Take 1 capsule (40 mg total) by mouth daily before breakfast. 30 capsule 0  . glucose blood (ACCU-CHEK SMARTVIEW) test strip Use to check blood sugar 1 to 2 times daily. diag code E11.9. Insulin dependent. 120 each 6  . ibuprofen (ADVIL,MOTRIN) 200 MG tablet Take 400 mg by mouth every 6 (six) hours as needed for mild pain.    Marland Kitchen LANTUS SOLOSTAR 100  UNIT/ML Solostar Pen INJECT 24 UNITS SUBCUTANEOUSLY AT BEDTIME. (Patient taking differently: INJECT 16 UNITS SUBCUTANEOUSLY AT BEDTIME.) 15 mL 5  . loratadine (CLARITIN) 10 MG tablet Take 1 tablet (10 mg total) by mouth daily. 90 tablet 1  . lovastatin (MEVACOR) 20 MG tablet TAKE (1) TABLET BY MOUTH AT BEDTIME. 90 tablet 0  . metFORMIN (GLUCOPHAGE) 1000 MG tablet TAKE (1) TABLET BY MOUTH TWICE DAILY WITH MEALS. 60 tablet 11  . mometasone (NASONEX) 50 MCG/ACT nasal spray Place 2 sprays into the nose 2 (two) times daily. (Patient taking differently: Place 2 sprays into the nose 2 (two) times daily as needed (Congestion). ) 17 g 12  . ondansetron (ZOFRAN) 4 MG tablet Take 1 tablet (4 mg total) by mouth every 8 (eight) hours as needed for nausea or vomiting. 4 tablet 0  . ziprasidone (GEODON) 80 MG capsule Take 160 mg by mouth daily.    Marland Kitchen ACCU-CHEK FASTCLIX LANCETS MISC Use to check blood sugar 1 to 2 times daily. diag code E11.9. Insulin dependent 204 each 6  . acetaminophen (TYLENOL) 500 MG tablet Take 1,000 mg by mouth every 6 (six) hours as needed for mild pain.    . benzonatate (TESSALON) 100 MG capsule Take 2 capsules (200 mg total) by mouth 3 (three) times daily as needed for cough. 21  capsule 0  . busPIRone (BUSPAR) 5 MG tablet Take 5 mg by mouth 2 (two) times daily. Reported on 05/11/2015    . nitroGLYCERIN (NITROSTAT) 0.3 MG SL tablet Place 1 tablet (0.3 mg total) under the tongue every 5 (five) minutes as needed for chest pain. (Patient not taking: Reported on 05/11/2015) 25 tablet 3  . NOVOFINE 32G X 6 MM MISC Use to inject insulin one time daily. Dx: E11.65 100 each 6  . topiramate (TOPAMAX) 50 MG tablet Take 1 tablet (50 mg total) by mouth 2 (two) times daily. (Patient not taking: Reported on 05/11/2015) 60 tablet 11   Current Facility-Administered Medications on File Prior to Visit  Medication Dose Route Frequency Provider Last Rate Last Dose  . 0.9 %  sodium chloride infusion   Intravenous  Once Merryl Hacker, MD      . pantoprazole (PROTONIX) injection 40 mg  40 mg Intravenous Once Merryl Hacker, MD        Review of Systems  Constitutional: Positive for fatigue. Negative for fever, chills and appetite change.  HENT: Negative for trouble swallowing.   Respiratory: Negative for shortness of breath.   Cardiovascular: Negative for chest pain, palpitations and leg swelling.  Gastrointestinal: Negative for nausea, vomiting, abdominal pain and diarrhea.  Genitourinary: Negative for dysuria and difficulty urinating.  Psychiatric/Behavioral: Negative for dysphoric mood.       Filed Vitals:   05/11/15 1445 05/11/15 1622  BP: 154/77 131/65  Pulse: 79 75  Temp: 98.6 F (37 C)   TempSrc: Oral   Height: 5\' 4"  (1.626 m)   Weight: 184 lb 4.8 oz (83.598 kg)   SpO2: 100%    Objective:   Physical Exam  Constitutional: She is oriented to person, place, and time. She appears well-developed. No distress.  HENT:  Head: Normocephalic and atraumatic.  Mouth/Throat: Oropharynx is clear and moist. No oropharyngeal exudate.  Eyes: Conjunctivae and EOM are normal. Pupils are equal, round, and reactive to light. Right eye exhibits no discharge. Left eye exhibits no discharge. No scleral icterus.  Neck: Neck supple.  Cardiovascular: Normal rate, regular rhythm and normal heart sounds.  Exam reveals no gallop and no friction rub.   No murmur heard. Pulmonary/Chest: Effort normal and breath sounds normal. No respiratory distress. She has no wheezes. She has no rales.  Abdominal: Soft. Bowel sounds are normal. She exhibits no distension and no mass. There is no tenderness. There is no rebound and no guarding.  Musculoskeletal: Normal range of motion. She exhibits no edema or tenderness.  Neurological: She is alert and oriented to person, place, and time. No cranial nerve deficit.  Skin: Skin is warm. She is not diaphoretic.  Psychiatric: She has a normal mood and affect. Her behavior is  normal. Judgment and thought content normal.  Vitals reviewed.         Assessment & Plan:  Please see problem based charting for A&P.

## 2015-05-11 NOTE — Assessment & Plan Note (Addendum)
Lab Results  Component Value Date   HGBA1C 6.7 05/11/2015   HGBA1C 8.1* 12/13/2014   HGBA1C 7.1 08/25/2014     Assessment: Diabetes control:  well controlled Progress toward A1C goal:   at goal Comments: She is doing well with diet.  Weight is down 20 pounds in past 5 months.  She did not bring meter today.  She has not been consistently checking CBG but reports hypoglycemic symptoms 2x in the past month.  Plan: Medications:  continue current medications:  Continue Lantus 16 units daily and metformin 1g BID. Home glucose monitoring: yes Frequency:  daily Timing:   Instruction/counseling given: reminded to bring blood glucose meter & log to each visit Educational resources provided: brochure (denies) Self management tools provided:   Other plans:  Will continue with diet, increase activity.  She will check her CBG at least qAM and return to clinic with meter at next visit.  Decrease by 2 units q3 days if AM CBGs < 80.  Follow-up in June 2017 when I am back in clinic.

## 2015-05-12 LAB — CMP14 + ANION GAP
ALT: 12 IU/L (ref 0–32)
ANION GAP: 18 mmol/L (ref 10.0–18.0)
AST: 13 IU/L (ref 0–40)
Albumin/Globulin Ratio: 1.9 (ref 1.1–2.5)
Albumin: 4.2 g/dL (ref 3.5–5.5)
Alkaline Phosphatase: 64 IU/L (ref 39–117)
BUN/Creatinine Ratio: 11 (ref 9–23)
BUN: 9 mg/dL (ref 6–24)
Bilirubin Total: <0.2 mg/dL (ref 0.0–1.2)
CALCIUM: 9.5 mg/dL (ref 8.7–10.2)
CO2: 20 mmol/L (ref 18–29)
CREATININE: 0.81 mg/dL (ref 0.57–1.00)
Chloride: 99 mmol/L (ref 96–106)
GFR calc Af Amer: 100 mL/min/{1.73_m2} (ref >59)
GFR, EST NON AFRICAN AMERICAN: 87 mL/min/{1.73_m2} (ref >59)
GLUCOSE: 130 mg/dL — AB (ref 65–99)
Globulin, Total: 2.2 g/dL (ref 1.5–4.5)
POTASSIUM: 4.1 mmol/L (ref 3.5–5.2)
Sodium: 137 mmol/L (ref 134–144)
Total Protein: 6.4 g/dL (ref 6.0–8.5)

## 2015-05-13 MED ORDER — ATORVASTATIN CALCIUM 80 MG PO TABS: 80.0000 mg | ORAL_TABLET | Freq: Every day | ORAL | Status: DC

## 2015-05-13 MED ORDER — INSULIN GLARGINE 100 UNIT/ML SOLOSTAR PEN: PEN_INJECTOR | SUBCUTANEOUS | Status: DC

## 2015-05-13 MED ORDER — MOMETASONE FUROATE 50 MCG/ACT NA SUSP: 2.0000 | Freq: Every day | NASAL | Status: DC

## 2015-05-13 MED ORDER — ESOMEPRAZOLE MAGNESIUM 40 MG PO CPDR: 40.0000 mg | DELAYED_RELEASE_CAPSULE | Freq: Every day | ORAL | Status: DC

## 2015-05-13 NOTE — Assessment & Plan Note (Signed)
  Assessment: Progress toward smoking cessation:   unchanged Barriers to progress toward smoking cessation:   addiction Comments: Not ready to quit.  Plan: Instruction/counseling given:  I counseled patient on the dangers of tobacco use, advised patient to stop smoking, and reviewed strategies to maximize success. Educational resources provided:  [employer] account manager) brochure (denies) Self management tools provided:    Medications to assist with smoking cessation:  Nicotine Patch Patient agreed to the following self-care plans for smoking cessation: call [employer] (1-800-QUIT-NOW)  Other plans: Not yet ready to quit.  She is contemplating.

## 2015-05-13 NOTE — Assessment & Plan Note (Addendum)
Assessment:  She reports stable mood and denies depression, anxiety or mania.  She is compliant with Geodon however she stopped Buspar because she did not like how it made her feel.   He affect, mood and behavior are appropriate. Plan:  She will continue Geodon. I advised her to inform her mental health provider of problem with Buspar so they can potentially switch medications.

## 2015-05-13 NOTE — Assessment & Plan Note (Signed)
Assessment:  She is on lovastatin.  Her ASCVD 10 year risk is 18.4% and she has DM and smokes so I will switch her to high intensity statin. Plan:  STOP lovastatin.  START Lipitor 80mg  daily.

## 2015-05-13 NOTE — Assessment & Plan Note (Signed)
Assessment:  BMI 38 --> 31 in past two years.  I commended her on diet and lifestyle changes.  Her BP and DM are well controlled and the weight loss is playing a big role in that. Plan:  Continue dietary changes, increase activity.  Follow-up next visit.

## 2015-05-13 NOTE — Assessment & Plan Note (Addendum)
Assessment:  She has not been taking Fe supplement.  Most recent Hgb was normal (three weeks ago). Plan:  Continue to monitor prn.

## 2015-05-13 NOTE — Assessment & Plan Note (Signed)
Assessment:  She says these symptoms have resolved since she started healthy diet and fasting. Plan:  Continue to monitor for symptoms recurrence.

## 2015-05-13 NOTE — Assessment & Plan Note (Addendum)
Assessment: 2015 EGD with small antral erosions and hiatal hernia.  She was started on Nexium BID and has continued that dose since that time.  She feels she needs this med BID in order to not get GERD symptoms.  She is not having odynophagia/dysphagia.  Her weight loss has been intentional and not related to GERD symptoms.  I have asked to try to reduce to daily use. Plan:  rx refilled with enough pills for her to take BID but she will try daily and see if symptoms ok.  Will eventually try to switch to H2 blocker.

## 2015-05-16 NOTE — Progress Notes (Signed)
Case discussed with Dr. Wilson soon after the resident saw the patient. We reviewed the resident's history and exam and pertinent patient test results. I agree with the assessment, diagnosis, and plan of care documented in the resident's note. 

## 2015-05-18 ENCOUNTER — Telehealth: Payer: Self-pay | Admitting: *Deleted

## 2015-05-18 NOTE — Telephone Encounter (Signed)
Attempts to call patient to notify her of need for med change Lovastatin to be changed to Atorvastatin.  Message left for patient to call the Clinics and to ask for [facility] concerning a medication change.  Sander Nephew, RN 05/18/2015 9:12 AM

## 2015-05-18 NOTE — Telephone Encounter (Signed)
RTC from patient given instructions to stop Lovastatin and to start Atorvastatin per Dr. Redmond Pulling.  Patient stated she understands the change.  Patient was informed that the medication has been sent to her drugstore in [place] .Sander Nephew, RN 05/18/2015 9:47 AM

## 2015-06-02 ENCOUNTER — Telehealth: Payer: Self-pay | Admitting: Internal Medicine

## 2015-06-02 NOTE — Telephone Encounter (Signed)
She states not sleeping well, sweating at night, also having h/a's

## 2015-06-02 NOTE — Telephone Encounter (Signed)
Can you please ask her to clarify "not agreeing" with her?  What symptoms is she having?

## 2015-06-02 NOTE — Telephone Encounter (Signed)
Called lm for rtc 

## 2015-06-02 NOTE — Telephone Encounter (Signed)
Not sure if all of these symptoms are attributable to Lipitor.  But possible.  Please ask her to stop Lipitor and she should come in for evaluation of symptoms and discussion of med alternative, like Crestor.

## 2015-06-02 NOTE — Telephone Encounter (Signed)
Patient states that her new medication Atorvastatin  Is not agreeing with her and would like something different.

## 2015-06-07 NOTE — Telephone Encounter (Signed)
Lm for rtc, will close out

## 2015-06-07 NOTE — Telephone Encounter (Signed)
[place], i have called and left multiple messages, please try to get in touch with pt

## 2015-06-13 NOTE — Telephone Encounter (Signed)
Pt called back, made an appt w/ dr Denton Brick for 3/3, pt has stopped lipitor since she called, states she feels much better, she would like some bloodwork

## 2015-06-14 ENCOUNTER — Other Ambulatory Visit: Payer: Self-pay | Admitting: Internal Medicine

## 2015-06-14 NOTE — Telephone Encounter (Signed)
Pt requesting cough medicine to be called to the pharmacy.

## 2015-06-16 MED ORDER — BENZONATATE 100 MG PO CAPS: 200.0000 mg | ORAL_CAPSULE | Freq: Three times a day (TID) | ORAL | Status: DC | PRN

## 2015-06-17 ENCOUNTER — Ambulatory Visit (INDEPENDENT_AMBULATORY_CARE_PROVIDER_SITE_OTHER): Payer: Medicare Other | Admitting: Internal Medicine

## 2015-06-17 ENCOUNTER — Encounter: Payer: Self-pay | Admitting: Internal Medicine

## 2015-06-17 VITALS — BP 143/82 | HR 77 | Temp 98.4°F | Wt 179.3 lb

## 2015-06-17 DIAGNOSIS — E785 Hyperlipidemia, unspecified: Secondary | ICD-10-CM | POA: Diagnosis not present

## 2015-06-17 DIAGNOSIS — J302 Other seasonal allergic rhinitis: Secondary | ICD-10-CM | POA: Diagnosis not present

## 2015-06-17 DIAGNOSIS — E119 Type 2 diabetes mellitus without complications: Secondary | ICD-10-CM | POA: Diagnosis not present

## 2015-06-17 MED ORDER — BENZONATATE 100 MG PO CAPS: 200.0000 mg | ORAL_CAPSULE | Freq: Three times a day (TID) | ORAL | Status: DC | PRN

## 2015-06-17 NOTE — Assessment & Plan Note (Addendum)
Patient was been taking Atorvastatin 80mg  daily, prescribed at her last visit- 05/11/2015. She described increased sweating, feeling hot with some body aches. She stooped the med 2 weeks ago after taking it for about 1 and a half week. She reports that her body feels back to normal now. She will like to know what her lipid profile is now like.  Lipid panel- 01/2014 showed a remarkable  LDL- 48, but elevated TG- 245. She was previously on lovastatin and tolerated it well.   Plan- Lipid panel today, patients LDL is at goal. With her recent weightloss and lifestyle modification, pts TG might be at goal. If TG high can consider starting fibrates or switching back to her previous Lovastatin and optimize the dose gradually. - If cholesterol is normal, consider just using lovastatin, which is low intensity, she is diabetic and should be ona  Moderate intensity statin but also she has no hx of Cardiovascular dx.  - Pt had only regular sodas today, so can check lipid today.  Addendum- 06/23/2015- Pts lipid panel, with improved TG- 145, down from 245 previously. Called patient back, left voice mail message, pt can go back to taking lovastatin daily as she was doing before, can can optimize dose on that, if she tolerates. Encouraged pt to continue with her weightloss, diet and exercise.

## 2015-06-17 NOTE — Progress Notes (Signed)
Patient ID: [name], female   DOB: Aug 09, 1965, 50 y.o.   MRN: [id number]   Subjective:   Patient ID: [name] female   DOB: 27-Mar-1966 50 y.o.   MRN: [id number]  HPI: [name] is a 50 y.o. with PMH listed below, presented today with two compliants- Cough and likely medication side effect, and follow up on her diabetes. Please see problem based charting for assessment and plan.  Past Medical History  Diagnosis Date  . Arteriosclerotic cardiovascular disease (ASCVD)     Minimal at cath in [facility].stress nuclear study in 8/08 with nl EF; neg stress echo in 2010  . Diabetes mellitus, type 2 ([place]) 2000    Onset in 2000; no insulin  . Hyperlipidemia   . Hypertension `    during treatment with Geodon  . Gastroesophageal reflux disease     Schatzki's ring  . Anemia, iron deficiency   . Alcohol abuse   . Depression   . Community acquired pneumonia 01/03/10, 05/2010, 04/2012    2011; with pleural effusion-[facility] acute resp failure; intubated in Jan 2014 (HMPV pneumonia)  . Obesity   . Schizoaffective disorder     requiring multiple psychiatric admissions  . Dysphagia   . Diastolic dysfunction     grade 2 per echo 2011  . Pulmonary hypertension ([place]) 05/02/2012    Patient needs repeat echo in 06/2012   . History of alcohol abuse 07/22/2007    Qualifier: Diagnosis of  By: Lenn Cal    . PTSD (post-traumatic stress disorder)    Review of Systems: CONSTITUTIONAL- No Fever, actively working on weightloss. SKIN- No Rash, colour changes or itching. HEAD- No Headache or dizziness. Mouth/throat- No Sorethroat,  RESPIRATORY- No Cough or SOB. CARDIAC- No Palpitations, or chest pain. GI- No nausea, vomiting, abd pain. URINARY- No Frequency, or dysuria. NEUROLOGIC- No Numbness, or burning.  Objective:  Physical Exam: Filed Vitals:   06/17/15 1316  BP: 143/82  Pulse: 77  Temp: 98.4 F (36.9 C)  TempSrc: Oral  Weight: 179 lb 4.8 oz (81.33 kg)   SpO2: 100%   GENERAL- alert, co-operative, appears as stated age, not in any distress. HEENT- Atraumatic, normocephalic, PERRL,  oral mucosa appears moist, neck supple. CARDIAC- RRR, no murmurs, rubs or gallops. RESP- Moving equal volumes of air, and clear to auscultation bilaterally, no wheezes or crackles. ABDOMEN- Soft, bowel sounds present. NEURO- No obvious Cr N abnormality, strenght upper and lower extremities intact, Gait- Normal. EXTREMITIES- warm and well perfused, no pedal edema. PSYCH- Normal mood and affect, appropriate thought content and speech.  Assessment & Plan:  The patient's case and plan of care was discussed with attending physician, Dr. Beryle Beams.  Please see problem based charting for assessment and plan.

## 2015-06-17 NOTE — Patient Instructions (Signed)
We will be checking your cholesterol level today. I will let you know next week what the plan will be.   Please check your blood sugars in the morning, especially as you are feeling like your blood sugars are on the low side. This will determine if we should reduce the dose of Lantus you are taking.   Congratulations on your weight loss, please keep it up. As you lose weight, your blood pressure and blood sugars will be better, and hopefully you will require less medication.  It was nice meeting you today.

## 2015-06-17 NOTE — Assessment & Plan Note (Signed)
Having cough today, with initial rhinorrhea,for ~1 week , no SOB, might have had some mild subjective fevers a few days ago. Sick contacts- mother who she stays with has similar symptoms. Most likely viral URI.  Plan- Tessalon pearls prescribed.

## 2015-06-17 NOTE — Telephone Encounter (Signed)
Called to pharm 

## 2015-06-17 NOTE — Progress Notes (Signed)
Medicine attending: Medical history, presenting problems, physical findings, and medications, reviewed with resident physician Dr Ejiro Emokpae on the day of the patient visit and I concur with her evaluation and management plan. 

## 2015-06-17 NOTE — Assessment & Plan Note (Signed)
Pt has not been checking her blood sugars. She says she sometimes feels hypoglycemic. I emphasized today the reason why she has to check her blood sugars everyday, as she might be less insulin resistant now with her weight loss and dietary modifications. Patient agreed.  Congratulated pt on weight loss, and encouraged her to continue.

## 2015-06-18 LAB — LIPID PANEL
CHOL/HDL RATIO: 4.9 ratio — AB (ref 0.0–4.4)
CHOLESTEROL TOTAL: 142 mg/dL (ref 100–199)
HDL: 29 mg/dL — AB (ref >39)
LDL CALC: 84 mg/dL (ref 0–99)
TRIGLYCERIDES: 147 mg/dL (ref 0–149)
VLDL Cholesterol Cal: 29 mg/dL (ref 5–40)

## 2015-06-23 ENCOUNTER — Telehealth: Payer: Self-pay | Admitting: Internal Medicine

## 2015-06-23 NOTE — Telephone Encounter (Signed)
Called patient back already earlier, no response, finally left voice mail message. Thanks.   Ejiro.

## 2015-06-23 NOTE — Telephone Encounter (Signed)
Pt requesting lab result. Please call pt back. °

## 2015-07-06 ENCOUNTER — Emergency Department (HOSPITAL_COMMUNITY)
Admission: EM | Admit: 2015-07-06 | Discharge: 2015-07-06 | Disposition: A | Discharge status: Home / Self Care | Payer: Medicare Other | Attending: Emergency Medicine | Admitting: Emergency Medicine

## 2015-07-06 ENCOUNTER — Emergency Department (HOSPITAL_COMMUNITY): Payer: Medicare Other

## 2015-07-06 ENCOUNTER — Encounter (HOSPITAL_COMMUNITY): Payer: Self-pay | Admitting: Emergency Medicine

## 2015-07-06 ENCOUNTER — Emergency Department (HOSPITAL_COMMUNITY)
Admission: EM | Admit: 2015-07-06 | Discharge: 2015-07-06 | Disposition: A | Discharge status: Home / Self Care | Payer: Medicare Other

## 2015-07-06 DIAGNOSIS — S8001XA Contusion of right knee, initial encounter: Secondary | ICD-10-CM | POA: Diagnosis not present

## 2015-07-06 DIAGNOSIS — E669 Obesity, unspecified: Secondary | ICD-10-CM | POA: Diagnosis not present

## 2015-07-06 DIAGNOSIS — E119 Type 2 diabetes mellitus without complications: Secondary | ICD-10-CM | POA: Diagnosis not present

## 2015-07-06 DIAGNOSIS — Z7984 Long term (current) use of oral hypoglycemic drugs: Secondary | ICD-10-CM | POA: Insufficient documentation

## 2015-07-06 DIAGNOSIS — S9001XA Contusion of right ankle, initial encounter: Secondary | ICD-10-CM | POA: Insufficient documentation

## 2015-07-06 DIAGNOSIS — E785 Hyperlipidemia, unspecified: Secondary | ICD-10-CM | POA: Diagnosis not present

## 2015-07-06 DIAGNOSIS — Y929 Unspecified place or not applicable: Secondary | ICD-10-CM | POA: Insufficient documentation

## 2015-07-06 DIAGNOSIS — F329 Major depressive disorder, single episode, unspecified: Secondary | ICD-10-CM | POA: Insufficient documentation

## 2015-07-06 DIAGNOSIS — Y999 Unspecified external cause status: Secondary | ICD-10-CM | POA: Diagnosis not present

## 2015-07-06 DIAGNOSIS — Z794 Long term (current) use of insulin: Secondary | ICD-10-CM | POA: Insufficient documentation

## 2015-07-06 DIAGNOSIS — F17210 Nicotine dependence, cigarettes, uncomplicated: Secondary | ICD-10-CM | POA: Diagnosis not present

## 2015-07-06 DIAGNOSIS — Y9389 Activity, other specified: Secondary | ICD-10-CM | POA: Diagnosis not present

## 2015-07-06 DIAGNOSIS — I10 Essential (primary) hypertension: Secondary | ICD-10-CM | POA: Insufficient documentation

## 2015-07-06 DIAGNOSIS — I2510 Atherosclerotic heart disease of native coronary artery without angina pectoris: Secondary | ICD-10-CM | POA: Insufficient documentation

## 2015-07-06 DIAGNOSIS — S99911A Unspecified injury of right ankle, initial encounter: Secondary | ICD-10-CM | POA: Diagnosis present

## 2015-07-06 DIAGNOSIS — M25561 Pain in right knee: Secondary | ICD-10-CM | POA: Diagnosis not present
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
## 2015-07-07 ENCOUNTER — Telehealth: Payer: Self-pay | Admitting: Internal Medicine

## 2015-07-07 DIAGNOSIS — K219 Gastro-esophageal reflux disease without esophagitis: Secondary | ICD-10-CM

## 2015-07-07 NOTE — Telephone Encounter (Signed)
Last ov 05/21/14. Pt has upcoming ov with RMR in April. Routing to the refill box.

## 2015-07-07 NOTE — Telephone Encounter (Signed)
PATIENT WANTS THE HIGHEST DOSAGE SHE CAN HAVE OF [facility] CALLED INTO [facility]

## 2015-07-08 MED ORDER — ESOMEPRAZOLE MAGNESIUM 40 MG PO CPDR
40.0000 mg | DELAYED_RELEASE_CAPSULE | Freq: Two times a day (BID) | ORAL | Status: DC
Start: 2015-07-08 — End: 2015-10-13

## 2015-07-08 NOTE — Telephone Encounter (Signed)
Nexium 40mg  BID is the highest dose. RX sent in.

## 2015-07-08 NOTE — Addendum Note (Signed)
Addended by: Mahala Menghini on: 07/08/2015 12:30 PM   Modules accepted: Orders

## 2015-07-18 DIAGNOSIS — F319 Bipolar disorder, unspecified: Secondary | ICD-10-CM | POA: Diagnosis not present

## 2015-07-24 ENCOUNTER — Ambulatory Visit (HOSPITAL_COMMUNITY)
Admission: EM | Admit: 2015-07-24 | Discharge: 2015-07-24 | Disposition: A | Discharge status: Home / Self Care | Payer: Medicare Other

## 2015-07-26 ENCOUNTER — Encounter (HOSPITAL_COMMUNITY): Payer: Self-pay | Admitting: Emergency Medicine

## 2015-07-26 ENCOUNTER — Ambulatory Visit (HOSPITAL_COMMUNITY)
Admission: EM | Admit: 2015-07-26 | Discharge: 2015-07-26 | Disposition: A | Discharge status: Home / Self Care | Payer: Medicare Other | Attending: Family Medicine | Admitting: Family Medicine

## 2015-07-26 DIAGNOSIS — R69 Illness, unspecified: Principal | ICD-10-CM

## 2015-07-26 DIAGNOSIS — J111 Influenza due to unidentified influenza virus with other respiratory manifestations: Secondary | ICD-10-CM | POA: Diagnosis not present

## 2015-07-26 DIAGNOSIS — E119 Type 2 diabetes mellitus without complications: Secondary | ICD-10-CM | POA: Diagnosis not present

## 2015-07-26 LAB — POCT I-STAT, CHEM 8
BUN: 3 mg/dL — AB (ref 6–20)
CALCIUM ION: 1.16 mmol/L (ref 1.12–1.23)
CHLORIDE: 99 mmol/L — AB (ref 101–111)
Creatinine, Ser: 0.8 mg/dL (ref 0.44–1.00)
Glucose, Bld: 250 mg/dL — ABNORMAL HIGH (ref 65–99)
HCT: 45 % (ref 36.0–46.0)
Hemoglobin: 15.3 g/dL — ABNORMAL HIGH (ref 12.0–15.0)
POTASSIUM: 3.9 mmol/L (ref 3.5–5.1)
SODIUM: 134 mmol/L — AB (ref 135–145)
TCO2: 19 mmol/L (ref 0–100)

## 2015-07-26 NOTE — ED Notes (Signed)
Here with flu like sx's diarrhea, nausea, vomiting and fever

## 2015-07-26 NOTE — Discharge Instructions (Signed)
Drink plenty of fluids, see your doctor about diabetes- sugar today was 250.

## 2015-07-26 NOTE — ED Provider Notes (Signed)
CSN: [id number]     Arrival date & time 07/26/15  1259 History   First MD Initiated Contact with Patient 07/26/15 1312     Chief Complaint  Patient presents with  . GI Problem  . Influenza   (Consider location/radiation/quality/duration/timing/severity/associated sxs/prior Treatment) Patient is a 50 y.o. female presenting with GI illness. The history is provided by the patient.  GI Problem This is a new problem. The current episode started more than 2 days ago. The problem has been gradually improving (v/d since sun, none today, assoc myalgias). Pertinent negatives include no chest pain, no abdominal pain, no headaches and no shortness of breath.    Past Medical History  Diagnosis Date  . Arteriosclerotic cardiovascular disease (ASCVD)     Minimal at cath in [facility].stress nuclear study in 8/08 with nl EF; neg stress echo in 2010  . Diabetes mellitus, type 2 ([place]) 2000    Onset in 2000; no insulin  . Hyperlipidemia   . Hypertension `    during treatment with Geodon  . Gastroesophageal reflux disease     Schatzki's ring  . Anemia, iron deficiency   . Alcohol abuse   . Depression   . Community acquired pneumonia 01/03/10, 05/2010, 04/2012    2011; with pleural effusion-[facility] acute resp failure; intubated in Jan 2014 (HMPV pneumonia)  . Obesity   . Schizoaffective disorder     requiring multiple psychiatric admissions  . Dysphagia   . Diastolic dysfunction     grade 2 per echo 2011  . Pulmonary hypertension ([place]) 05/02/2012    Patient needs repeat echo in 06/2012   . History of alcohol abuse 07/22/2007    Qualifier: Diagnosis of  By: Lenn Cal    . PTSD (post-traumatic stress disorder)    Past Surgical History  Procedure Laterality Date  . Dilation and curettage, diagnostic / therapeutic  1992  . Esophagogastroduodenoscopy  09/16/08    Dr. Trevor Iha hiatal hernia/excoriations involving the cardia and mucosa consistent with trauma, antral erosions  of  linear petechiae ? gastritis versus early gastric antral vascular  ectasia.Marland Kitchen biopsy showed reactive gastropathy. No H. pylori.  . Esophagogastroduodenoscopy  09/2007    Dr. Evalee Mutton ring, dilated to 38 French Maloney dilator, small hiatal hernia, antral erosions, biopsies reactive gastropathy.  Azzie Almas dilation  07/17/2011    [facility] sedation-->distal esophageal stricture s/p dilation, chronic gastritis, multiple ulcers in stomach. no h.pylori  . Colonoscopy  01/2006    internal hemorrhoids  . Colonoscopy  01/10/2012    Dr. Rourk:Single anal canal hemorrhoidal tag likely source of  trivial hematochezia; right-sided colonic diverticulosis  . Esophagogastroduodenoscopy (egd) with propofol N/A 12/17/2013    [id number] antral erosions and petechiae. Small hiatal hernia. No endoscopic explanation for patient's symptoms   Family History  Problem Relation Age of Onset  . Colon cancer Other   . Hypertension Mother   . Stroke Father     deceased at age 76  . Heart disease Sister   . Anesthesia problems Neg Hx   . Hypotension Neg Hx   . Malignant hyperthermia Neg Hx   . Pseudochol deficiency Neg Hx   . Diabetes    . High Cholesterol    . Arthritis     Social History  Substance Use Topics  . Smoking status: Current Every Day Smoker -- 1.00 packs/day for 30 years    Types: Cigarettes    Start date: 05/18/2012  . Smokeless tobacco: Never Used     Comment: cutting  back,  . Alcohol Use: No     Comment: hx of ETOH abuse   OB History    Gravida Para Term Preterm AB TAB SAB Ectopic Multiple Living   3 2 2  1 1    2      Review of Systems  Constitutional: Positive for fever, chills and fatigue.  HENT: Negative.   Respiratory: Negative for shortness of breath.   Cardiovascular: Negative for chest pain.  Gastrointestinal: Positive for nausea, vomiting and diarrhea. Negative for abdominal pain and blood in stool.  Genitourinary: Negative.   Musculoskeletal: Positive for myalgias.   Neurological: Negative for headaches.  All other systems reviewed and are negative.   Allergies  Cephalexin; Metronidazole; Orange; Shrimp; Penicillins; Sulfonamide derivatives; Glipizide; and Sulfamethoxazole-trimethoprim  Home Medications   Prior to Admission medications   Medication Sig Start Date End Date Taking? Authorizing Provider  ACCU-CHEK FASTCLIX LANCETS MISC Use to check blood sugar 1 to 2 times daily. diag code E11.9. Insulin dependent 05/06/15   Francesca Oman, DO  acetaminophen (TYLENOL) 500 MG tablet Take 1,000 mg by mouth every 6 (six) hours as needed for mild pain.    Historical Provider, MD  albuterol (PROVENTIL HFA;VENTOLIN HFA) 108 (90 BASE) MCG/ACT inhaler Inhale 2 puffs into the lungs every 4 (four) hours as needed for wheezing or shortness of breath. 02/22/15   Lysbeth Penner, FNP  atorvastatin (LIPITOR) 80 MG tablet Take 1 tablet (80 mg total) by mouth daily. 05/13/15   Francesca Oman, DO  benzonatate (TESSALON) 100 MG capsule Take 2 capsules (200 mg total) by mouth 3 (three) times daily as needed for cough. 06/17/15   Ejiroghene Arlyce Dice, MD  busPIRone (BUSPAR) 5 MG tablet Take 5 mg by mouth 2 (two) times daily. Reported on 05/11/2015 12/15/14   Historical Provider, MD  esomeprazole (NEXIUM) 40 MG capsule Take 1 capsule (40 mg total) by mouth 2 (two) times daily before a meal. 07/08/15   Mahala Menghini, PA-C  furosemide (LASIX) 20 MG tablet Take 1 tablet (20 mg total) by mouth daily. 05/11/15   Francesca Oman, DO  glucose blood (ACCU-CHEK SMARTVIEW) test strip Use to check blood sugar 1 to 2 times daily. diag code E11.9. Insulin dependent. 05/06/15   Francesca Oman, DO  ibuprofen (ADVIL,MOTRIN) 200 MG tablet Take 400 mg by mouth every 6 (six) hours as needed for mild pain.    Historical Provider, MD  Insulin Glargine (LANTUS SOLOSTAR) 100 UNIT/ML Solostar Pen INJECT 16 UNITS SUBCUTANEOUSLY AT BEDTIME. 05/13/15   Francesca Oman, DO  lisinopril (PRINIVIL,ZESTRIL) 40 MG tablet Take  1 tablet (40 mg total) by mouth daily. 05/11/15   Francesca Oman, DO  loratadine (CLARITIN) 10 MG tablet Take 1 tablet (10 mg total) by mouth daily. 04/21/15   Francesca Oman, DO  metFORMIN (GLUCOPHAGE) 1000 MG tablet TAKE (1) TABLET BY MOUTH TWICE DAILY WITH MEALS. 11/01/14   Francesca Oman, DO  metoprolol (LOPRESSOR) 100 MG tablet Take 1 tablet (100 mg total) by mouth 2 (two) times daily. 05/11/15   Francesca Oman, DO  mometasone (NASONEX) 50 MCG/ACT nasal spray Place 2 sprays into the nose daily. 05/13/15   Francesca Oman, DO  nitroGLYCERIN (NITROSTAT) 0.3 MG SL tablet Place 1 tablet (0.3 mg total) under the tongue every 5 (five) minutes as needed for chest pain. Patient not taking: Reported on 05/11/2015 12/09/14   Lendon Colonel, NP  NOVOFINE 32G X 6 MM MISC Use to  inject insulin one time daily. Dx: E11.65 12/30/14   Francesca Oman, DO  ondansetron (ZOFRAN) 4 MG tablet Take 1 tablet (4 mg total) by mouth every 8 (eight) hours as needed for nausea or vomiting. 04/16/15   Daleen Bo, MD  potassium chloride SA (K-DUR,KLOR-CON) 20 MEQ tablet Take 1 tablet (20 mEq total) by mouth daily. 05/11/15   Francesca Oman, DO  topiramate (TOPAMAX) 50 MG tablet Take 1 tablet (50 mg total) by mouth 2 (two) times daily. Patient not taking: Reported on 05/11/2015 11/25/14   Melvenia Beam, MD  ziprasidone (GEODON) 80 MG capsule Take 160 mg by mouth daily.    Historical Provider, MD   Meds Ordered and Administered this Visit  Medications - No data to display  BP 150/93 mmHg  Pulse 88  Temp(Src) 101.2 F (38.4 C) (Oral)  Resp 16  SpO2 99%  LMP 05/09/2015 No data found.   Physical Exam  Constitutional: She is oriented to person, place, and time. She appears well-developed and well-nourished. No distress.  HENT:  Right Ear: External ear normal.  Left Ear: External ear normal.  Mouth/Throat: Oropharynx is clear and moist.  Neck: Normal range of motion. Neck supple.  Cardiovascular: Normal rate, normal heart  sounds and intact distal pulses.   Pulmonary/Chest: Effort normal and breath sounds normal.  Abdominal: Soft. Bowel sounds are normal. There is no tenderness.  Lymphadenopathy:    She has no cervical adenopathy.  Neurological: She is alert and oriented to person, place, and time.  Skin: Skin is warm and dry.  Nursing note and vitals reviewed.   ED Course  Procedures (including critical care time)  Labs Review Labs Reviewed  POCT I-STAT, CHEM 8 - Abnormal; Notable for the following:    Sodium 134 (*)    Chloride 99 (*)    BUN 3 (*)    Glucose, Bld 250 (*)    Hemoglobin 15.3 (*)    All other components within normal limits    Imaging Review No results found.   Visual Acuity Review  Right Eye Distance:   Left Eye Distance:   Bilateral Distance:    Right Eye Near:   Left Eye Near:    Bilateral Near:         MDM   1. Influenza-like illness   2. Diabetes mellitus type 2 in nonobese [facility])        Billy Fischer, MD 07/26/15 1340

## 2015-07-30 ENCOUNTER — Encounter (HOSPITAL_COMMUNITY): Payer: Self-pay

## 2015-07-30 ENCOUNTER — Emergency Department (HOSPITAL_COMMUNITY)
Admission: EM | Admit: 2015-07-30 | Discharge: 2015-07-30 | Disposition: A | Discharge status: Home / Self Care | Payer: Medicare Other | Attending: Emergency Medicine | Admitting: Emergency Medicine

## 2015-07-30 ENCOUNTER — Emergency Department (HOSPITAL_COMMUNITY): Payer: Medicare Other

## 2015-07-30 DIAGNOSIS — R0789 Other chest pain: Secondary | ICD-10-CM | POA: Diagnosis not present

## 2015-07-30 DIAGNOSIS — E119 Type 2 diabetes mellitus without complications: Secondary | ICD-10-CM | POA: Diagnosis not present

## 2015-07-30 DIAGNOSIS — F17210 Nicotine dependence, cigarettes, uncomplicated: Secondary | ICD-10-CM | POA: Diagnosis not present

## 2015-07-30 DIAGNOSIS — R0602 Shortness of breath: Secondary | ICD-10-CM | POA: Diagnosis not present

## 2015-07-30 DIAGNOSIS — Z7984 Long term (current) use of oral hypoglycemic drugs: Secondary | ICD-10-CM | POA: Insufficient documentation

## 2015-07-30 DIAGNOSIS — Z791 Long term (current) use of non-steroidal anti-inflammatories (NSAID): Secondary | ICD-10-CM | POA: Diagnosis not present

## 2015-07-30 DIAGNOSIS — F329 Major depressive disorder, single episode, unspecified: Secondary | ICD-10-CM | POA: Diagnosis not present

## 2015-07-30 DIAGNOSIS — I10 Essential (primary) hypertension: Secondary | ICD-10-CM | POA: Diagnosis not present

## 2015-07-30 DIAGNOSIS — Z79899 Other long term (current) drug therapy: Secondary | ICD-10-CM | POA: Insufficient documentation

## 2015-07-30 DIAGNOSIS — I2510 Atherosclerotic heart disease of native coronary artery without angina pectoris: Secondary | ICD-10-CM | POA: Insufficient documentation

## 2015-07-30 DIAGNOSIS — R079 Chest pain, unspecified: Secondary | ICD-10-CM | POA: Insufficient documentation

## 2015-07-30 DIAGNOSIS — J111 Influenza due to unidentified influenza virus with other respiratory manifestations: Secondary | ICD-10-CM | POA: Diagnosis not present

## 2015-07-30 DIAGNOSIS — E669 Obesity, unspecified: Secondary | ICD-10-CM | POA: Diagnosis not present

## 2015-07-30 DIAGNOSIS — R69 Illness, unspecified: Secondary | ICD-10-CM

## 2015-07-30 DIAGNOSIS — Z794 Long term (current) use of insulin: Secondary | ICD-10-CM | POA: Insufficient documentation

## 2015-07-30 DIAGNOSIS — R05 Cough: Secondary | ICD-10-CM | POA: Diagnosis not present

## 2015-07-30 LAB — COMPREHENSIVE METABOLIC PANEL
ALT: 14 U/L (ref 14–54)
AST: 20 U/L (ref 15–41)
Albumin: 3.9 g/dL (ref 3.5–5.0)
Alkaline Phosphatase: 67 U/L (ref 38–126)
Anion gap: 9 (ref 5–15)
BILIRUBIN TOTAL: 0.4 mg/dL (ref 0.3–1.2)
BUN: 9 mg/dL (ref 6–20)
CALCIUM: 8.8 mg/dL — AB (ref 8.9–10.3)
CHLORIDE: 99 mmol/L — AB (ref 101–111)
CO2: 26 mmol/L (ref 22–32)
CREATININE: 0.84 mg/dL (ref 0.44–1.00)
Glucose, Bld: 108 mg/dL — ABNORMAL HIGH (ref 65–99)
Potassium: 3.7 mmol/L (ref 3.5–5.1)
Sodium: 134 mmol/L — ABNORMAL LOW (ref 135–145)
TOTAL PROTEIN: 7.7 g/dL (ref 6.5–8.1)

## 2015-07-30 LAB — CBC WITH DIFFERENTIAL/PLATELET
BLASTS: 0 %
Band Neutrophils: 0 %
Basophils Absolute: 0 10*3/uL (ref 0.0–0.1)
Basophils Relative: 0 %
EOS ABS: 0.1 10*3/uL (ref 0.0–0.7)
Eosinophils Relative: 1 %
HEMATOCRIT: 40.5 % (ref 36.0–46.0)
HEMOGLOBIN: 13.8 g/dL (ref 12.0–15.0)
LYMPHS PCT: 45 %
Lymphs Abs: 3.3 10*3/uL (ref 0.7–4.0)
MCH: 28.9 pg (ref 26.0–34.0)
MCHC: 34.1 g/dL (ref 30.0–36.0)
MCV: 84.9 fL (ref 78.0–100.0)
MONOS PCT: 3 %
Metamyelocytes Relative: 0 %
Monocytes Absolute: 0.2 10*3/uL (ref 0.1–1.0)
Myelocytes: 0 %
NEUTROS ABS: 3.8 10*3/uL (ref 1.7–7.7)
NEUTROS PCT: 51 %
OTHER: 0 %
PLATELETS: 195 10*3/uL (ref 150–400)
PROMYELOCYTES ABS: 0 %
RBC: 4.77 MIL/uL (ref 3.87–5.11)
RDW: 14.8 % (ref 11.5–15.5)
WBC: 7.4 10*3/uL (ref 4.0–10.5)
nRBC: 0 /100 WBC

## 2015-07-30 LAB — I-STAT TROPONIN, ED: TROPONIN I, POC: 0 ng/mL (ref 0.00–0.08)

## 2015-07-30 LAB — URINE MICROSCOPIC-ADD ON

## 2015-07-30 LAB — URINALYSIS, ROUTINE W REFLEX MICROSCOPIC
Bilirubin Urine: NEGATIVE
Glucose, UA: NEGATIVE mg/dL
KETONES UR: NEGATIVE mg/dL
LEUKOCYTES UA: NEGATIVE
NITRITE: NEGATIVE
PROTEIN: NEGATIVE mg/dL
Specific Gravity, Urine: <1.005 — ABNORMAL LOW (ref 1.005–1.030)
pH: 6 (ref 5.0–8.0)

## 2015-07-30 LAB — CBG MONITORING, ED: GLUCOSE-CAPILLARY: 110 mg/dL — AB (ref 65–99)

## 2015-07-30 MED ORDER — SODIUM CHLORIDE 0.9 % IV BOLUS (SEPSIS)
1000.0000 mL | Freq: Once | INTRAVENOUS | Status: AC
  Administered 2015-07-30: 1000 mL via INTRAVENOUS

## 2015-07-30 MED ORDER — GUAIFENESIN-CODEINE 100-10 MG/5ML PO SOLN: 10.0000 mL | Freq: Four times a day (QID) | ORAL | Status: DC | PRN

## 2015-07-30 NOTE — ED Notes (Signed)
Pt seen at urgent care dx w/ flu like symptoms. States she thinks she is dehydrated, able to drink water & Gatorade. Is having some diarrhea & bloods sugars are running high.

## 2015-07-30 NOTE — ED Notes (Signed)
Pt alert & oriented x4, stable gait. Patient given discharge instructions, paperwork & prescription(s). Patient  instructed to stop at the registration desk to finish any additional paperwork. Patient verbalized understanding. Pt left department w/ no further questions. 

## 2015-07-30 NOTE — Discharge Instructions (Signed)
Get plenty of rest and keep well hydrated. Your chest x-ray does not show pneumonia and your blood work overall looks good. Return for worsening symptoms, including difficulty breathing, passing out, or any other symptoms concerning to you.  Cough, Adult Coughing is a reflex that clears your throat and your airways. Coughing helps to heal and protect your lungs. It is normal to cough occasionally, but a cough that happens with other symptoms or lasts a long time may be a sign of a condition that needs treatment. A cough may last only 2-3 weeks (acute), or it may last longer than 8 weeks (chronic). CAUSES Coughing is commonly caused by:  Breathing in substances that irritate your lungs.  A viral or bacterial respiratory infection.  Allergies.  Asthma.  Postnasal drip.  Smoking.  Acid backing up from the stomach into the esophagus (gastroesophageal reflux).  Certain medicines.  Chronic lung problems, including COPD (or rarely, lung cancer).  Other medical conditions such as heart failure. HOME CARE INSTRUCTIONS  Pay attention to any changes in your symptoms. Take these actions to help with your discomfort:  Take medicines only as told by your health care provider.  If you were prescribed an antibiotic medicine, take it as told by your health care provider. Do not stop taking the antibiotic even if you start to feel better.  Talk with your health care provider before you take a cough suppressant medicine.  Drink enough fluid to keep your urine clear or pale yellow.  If the air is dry, use a cold steam vaporizer or humidifier in your bedroom or your home to help loosen secretions.  Avoid anything that causes you to cough at work or at home.  If your cough is worse at night, try sleeping in a semi-upright position.  Avoid cigarette smoke. If you smoke, quit smoking. If you need help quitting, ask your health care provider.  Avoid caffeine.  Avoid alcohol.  Rest as  needed. SEEK MEDICAL CARE IF:   You have new symptoms.  You cough up pus.  Your cough does not get better after 2-3 weeks, or your cough gets worse.  You cannot control your cough with suppressant medicines and you are losing sleep.  You develop pain that is getting worse or pain that is not controlled with pain medicines.  You have a fever.  You have unexplained weight loss.  You have night sweats. SEEK IMMEDIATE MEDICAL CARE IF:  You cough up blood.  You have difficulty breathing.  Your heartbeat is very fast.   This information is not intended to replace advice given to you by your health care provider. Make sure you discuss any questions you have with your health care provider.   Document Released: 09/29/2010 Document Revised: 12/22/2014 Document Reviewed: 06/09/2014 Elsevier Interactive Patient Education [employer].

## 2015-07-30 NOTE — ED Notes (Signed)
I have been sick since last Sunday. I was evaluated at urgent care and they diagnosed me with the flu. I have Tylenol, Mucinex, Tessalon Pearls for the cough.  I feel like I am dehydrated. Coughing a lot, feeling dizzy today, and really thirsty per pt. Diabetic and blood sugars have been in 150s 170s.

## 2015-07-30 NOTE — ED Notes (Signed)
Pt provided a crackers, peanut butter & drink.

## 2015-07-30 NOTE — ED Provider Notes (Signed)
CSN: [id number]     Arrival date & time 07/30/15  2143 History  By signing my name below, I, Helane Gunther, attest that this documentation has been prepared under the direction and in the presence of Forde Dandy, MD. Electronically Signed: Helane Gunther, ED Scribe. 07/30/2015. 10:28 PM.    Chief Complaint  Patient presents with  . Influenza   The history is provided by the patient. No language interpreter was used.   HPI Comments: [name] is a 50 y.o. female with a PMHx of Dm, HTN, HLD, and ASCVD who presents to the Emergency Department complaining of lightheadedness and flu-like symptoms. She notes her sugars have been high due to not eating very much this week after being diagnosed with flu. She notes she finally took her medications 30 minutes ago, which has reduced her blood glucose levels. She notes she has been thirsty and feeling as though her mouth is dry all day, as well as having ncreased frequency of urination. She notes she came to the ED today because she thought she may need IV fluids to help lower her blood sugar. She reports associated diarrhea, nausea, cough, post-tussive central chest soreness, and fever (Tmax 101, now reduced to 99 for the past few days). She notes she is still experiencing the same symptoms she came to the ED with 6 days ago, when she was diagnosed with influenza. Pt denies vomiting, dyspnea, rhinorrhea, syncope and congestion.    Past Medical History  Diagnosis Date  . Arteriosclerotic cardiovascular disease (ASCVD)     Minimal at cath in [facility].stress nuclear study in 8/08 with nl EF; neg stress echo in 2010  . Diabetes mellitus, type 2 ([place]) 2000    Onset in 2000; no insulin  . Hyperlipidemia   . Hypertension `    during treatment with Geodon  . Gastroesophageal reflux disease     Schatzki's ring  . Anemia, iron deficiency   . Alcohol abuse   . Depression   . Community acquired pneumonia 01/03/10, 05/2010, 04/2012    2011; with  pleural effusion-[facility] acute resp failure; intubated in Jan 2014 ([facility] pneumonia)  . Obesity   . Schizoaffective disorder     requiring multiple psychiatric admissions  . Dysphagia   . Diastolic dysfunction     grade 2 per echo 2011  . Pulmonary hypertension ([place]) 05/02/2012    Patient needs repeat echo in 06/2012   . History of alcohol abuse 07/22/2007    Qualifier: Diagnosis of  By: Lenn Cal    . PTSD (post-traumatic stress disorder)    Past Surgical History  Procedure Laterality Date  . Dilation and curettage, diagnostic / therapeutic  1992  . Esophagogastroduodenoscopy  09/16/08    Dr. Trevor Iha hiatal hernia/excoriations involving the cardia and mucosa consistent with trauma, antral erosions  of linear petechiae ? gastritis versus early gastric antral vascular  ectasia.Marland Kitchen biopsy showed reactive gastropathy. No H. pylori.  . Esophagogastroduodenoscopy  09/2007    Dr. Evalee Mutton ring, dilated to 78 French Maloney dilator, small hiatal hernia, antral erosions, biopsies reactive gastropathy.  Azzie Almas dilation  07/17/2011    [facility] sedation-->distal esophageal stricture s/p dilation, chronic gastritis, multiple ulcers in stomach. no h.pylori  . Colonoscopy  01/2006    internal hemorrhoids  . Colonoscopy  01/10/2012    Dr. Rourk:Single anal canal hemorrhoidal tag likely source of  trivial hematochezia; right-sided colonic diverticulosis  . Esophagogastroduodenoscopy (egd) with propofol N/A 12/17/2013    [id number] antral erosions  and petechiae. Small hiatal hernia. No endoscopic explanation for patient's symptoms   Family History  Problem Relation Age of Onset  . Colon cancer Other   . Hypertension Mother   . Stroke Father     deceased at age 69  . Heart disease Sister   . Anesthesia problems Neg Hx   . Hypotension Neg Hx   . Malignant hyperthermia Neg Hx   . Pseudochol deficiency Neg Hx   . Diabetes    . High Cholesterol    . Arthritis     Social History   Substance Use Topics  . Smoking status: Current Every Day Smoker -- 1.00 packs/day for 30 years    Types: Cigarettes    Start date: 05/18/2012  . Smokeless tobacco: Never Used     Comment: cutting back,  . Alcohol Use: No     Comment: hx of ETOH abuse   OB History    Gravida Para Term Preterm AB TAB SAB Ectopic Multiple Living   3 2 2  1 1    2      Review of Systems  Constitutional: Positive for fever.  HENT: Negative for congestion, rhinorrhea and sore throat.   Respiratory: Positive for cough.   Cardiovascular: Positive for chest pain.  Gastrointestinal: Positive for nausea and diarrhea. Negative for vomiting.  Genitourinary: Positive for frequency.  Neurological: Positive for dizziness.  All other systems reviewed and are negative.   Allergies  Cephalexin; Metronidazole; Orange; Shrimp; Penicillins; Sulfonamide derivatives; Glipizide; and Sulfamethoxazole-trimethoprim  Home Medications   Prior to Admission medications   Medication Sig Start Date End Date Taking? Authorizing Provider  acetaminophen (TYLENOL) 500 MG tablet Take 1,000 mg by mouth every 6 (six) hours as needed for mild pain.   Yes Historical Provider, MD  albuterol (PROVENTIL HFA;VENTOLIN HFA) 108 (90 BASE) MCG/ACT inhaler Inhale 2 puffs into the lungs every 4 (four) hours as needed for wheezing or shortness of breath. 02/22/15  Yes Lysbeth Penner, FNP  benzonatate (TESSALON) 100 MG capsule Take 2 capsules (200 mg total) by mouth 3 (three) times daily as needed for cough. 06/17/15  Yes Ejiroghene Arlyce Dice, MD  esomeprazole (NEXIUM) 40 MG capsule Take 1 capsule (40 mg total) by mouth 2 (two) times daily before a meal. 07/08/15  Yes Mahala Menghini, PA-C  furosemide (LASIX) 20 MG tablet Take 1 tablet (20 mg total) by mouth daily. 05/11/15  Yes Alex Ronnie Derby, DO  ibuprofen (ADVIL,MOTRIN) 200 MG tablet Take 400 mg by mouth every 6 (six) hours as needed for mild pain.   Yes Historical Provider, MD  Insulin Glargine  (LANTUS SOLOSTAR) 100 UNIT/ML Solostar Pen INJECT 16 UNITS SUBCUTANEOUSLY AT BEDTIME. 05/13/15  Yes Francesca Oman, DO  lisinopril (PRINIVIL,ZESTRIL) 40 MG tablet Take 1 tablet (40 mg total) by mouth daily. 05/11/15  Yes Alex Ronnie Derby, DO  loratadine (CLARITIN) 10 MG tablet Take 1 tablet (10 mg total) by mouth daily. 04/21/15  Yes Alex Ronnie Derby, DO  metFORMIN (GLUCOPHAGE) 1000 MG tablet TAKE (1) TABLET BY MOUTH TWICE DAILY WITH MEALS. 11/01/14  Yes Francesca Oman, DO  metoprolol (LOPRESSOR) 100 MG tablet Take 1 tablet (100 mg total) by mouth 2 (two) times daily. 05/11/15  Yes Alex Ronnie Derby, DO  mometasone (NASONEX) 50 MCG/ACT nasal spray Place 2 sprays into the nose daily. 05/13/15  Yes Alex Ronnie Derby, DO  ondansetron (ZOFRAN) 4 MG tablet Take 1 tablet (4 mg total) by mouth every 8 (eight) hours as needed  for nausea or vomiting. 04/16/15  Yes Daleen Bo, MD  potassium chloride SA (K-DUR,KLOR-CON) 20 MEQ tablet Take 1 tablet (20 mEq total) by mouth daily. 05/11/15  Yes Francesca Oman, DO  ziprasidone (GEODON) 80 MG capsule Take 160 mg by mouth daily.   Yes Historical Provider, MD  ACCU-CHEK FASTCLIX LANCETS MISC Use to check blood sugar 1 to 2 times daily. diag code E11.9. Insulin dependent 05/06/15   Francesca Oman, DO  atorvastatin (LIPITOR) 80 MG tablet Take 1 tablet (80 mg total) by mouth daily. Patient not taking: Reported on 07/30/2015 05/13/15   Francesca Oman, DO  busPIRone (BUSPAR) 10 MG tablet Take 10 mg by mouth 2 (two) times daily. 06/25/15   Historical Provider, MD  gabapentin (NEURONTIN) 100 MG capsule Take 100 mg by mouth 2 (two) times daily. 07/28/15   Historical Provider, MD  glucose blood (ACCU-CHEK SMARTVIEW) test strip Use to check blood sugar 1 to 2 times daily. diag code E11.9. Insulin dependent. 05/06/15   Francesca Oman, DO  guaiFENesin-codeine 100-10 MG/5ML syrup Take 10 mLs by mouth every 6 (six) hours as needed for cough. 07/30/15   Forde Dandy, MD  lovastatin (MEVACOR) 20 MG tablet Take 20  mg by mouth every evening. 06/23/15   Historical Provider, MD  nitroGLYCERIN (NITROSTAT) 0.3 MG SL tablet Place 1 tablet (0.3 mg total) under the tongue every 5 (five) minutes as needed for chest pain. 12/09/14   Lendon Colonel, NP  NOVOFINE 32G X 6 MM MISC Use to inject insulin one time daily. Dx: E11.65 12/30/14   Francesca Oman, DO   BP 108/79 mmHg  Pulse 87  Temp(Src) 99.1 F (37.3 C) (Oral)  Resp 18  Ht 5\' 4"  (1.626 m)  Wt 179 lb (81.194 kg)  BMI 30.71 kg/m2  SpO2 99%  LMP 05/09/2015 Physical Exam Physical Exam  Nursing note and vitals reviewed. Constitutional: Well developed, well nourished, non-toxic, and in no acute distress Head: Normocephalic and atraumatic.  Mouth/Throat: Oropharynx is clear, dry mucous membranes Neck: Normal range of motion. Neck supple.  Cardiovascular: Normal rate and regular rhythm.   Pulmonary/Chest: Effort normal and breath sounds normal.  Abdominal: Soft. There is no tenderness. There is no rebound and no guarding.  Musculoskeletal: Normal range of motion.  Neurological: Alert, no facial droop, fluent speech, moves all extremities symmetrically Skin: Skin is warm and dry.  Psychiatric: Cooperative  ED Course  Procedures  DIAGNOSTIC STUDIES: Oxygen Saturation is 99% on RA, normal by my interpretation.    COORDINATION OF CARE: 10:26 PM - Discussed plans to order diagnostic studies and imaging, as well as IV fluids. Pt advised of plan for treatment and pt agrees.  Labs Review Labs Reviewed  URINALYSIS, ROUTINE W REFLEX MICROSCOPIC (NOT AT [facility]) - Abnormal; Notable for the following:    Specific Gravity, Urine <1.005 (*)    Hgb urine dipstick LARGE (*)    All other components within normal limits  COMPREHENSIVE METABOLIC PANEL - Abnormal; Notable for the following:    Sodium 134 (*)    Chloride 99 (*)    Glucose, Bld 108 (*)    Calcium 8.8 (*)    All other components within normal limits  URINE MICROSCOPIC-ADD ON - Abnormal; Notable for  the following:    Squamous Epithelial / LPF 0-5 (*)    Bacteria, UA FEW (*)    All other components within normal limits  CBG MONITORING, ED - Abnormal; Notable for the following:  Glucose-Capillary 110 (*)    All other components within normal limits  CBC WITH DIFFERENTIAL/PLATELET  Randolm Idol, ED    Imaging Review Dg Chest 2 View  07/30/2015  CLINICAL DATA:  Acute onset of productive cough, generalized chest pain and shortness of breath. Initial encounter. EXAM: CHEST  2 VIEW COMPARISON:  Chest radiograph performed 12/04/2014 FINDINGS: The lungs are well-aerated. Minimal bibasilar opacities likely reflect atelectasis. There is no evidence of pleural effusion or pneumothorax. The heart is normal in size; the mediastinal contour is within normal limits. No acute osseous abnormalities are seen. IMPRESSION: Minimal bibasilar opacities likely reflect atelectasis. Electronically Signed   By: Garald Balding M.D.   On: 07/30/2015 23:22   I have personally reviewed and evaluated these images and lab results as part of my medical decision-making.   EKG Interpretation   Date/Time:  Saturday July 30 2015 22:45:40 EDT Ventricular Rate:  88 PR Interval:  109 QRS Duration: 80 QT Interval:  397 QTC Calculation: 480 R Axis:   71 Text Interpretation:  Sinus rhythm Short PR interval Right atrial  enlargement No change since last EKG Confirmed by Juanmanuel Marohl MD, Hinton Dyer [id number]) on  07/30/2015 10:49:25 PM      MDM   Final diagnoses:  Influenza-like illness    50 year old female with concern for hyperglycemia and dehydration in setting of flu like illness. Non-toxic and in no acute distress. Afebrile with normal vital signs. Appears dry on exam, but otherwise unremarkable exam. Suspect some mild dehydration, but blood work with normal renal function and no major electrolyte or metabolic derangements. UA without infection, with blood but currently menstruating. CXR without infiltrate. Given IVF and  feels improved. Discussed continued supportive care instructions for home. Strict return and follow-up instructions reviewed. She expressed understanding of all discharge instructions and felt comfortable with the plan of care.   I personally performed the services described in this documentation, which was scribed in my presence. The recorded information has been reviewed and is accurate.   Forde Dandy, MD 07/30/15 [phone]

## 2015-08-02 ENCOUNTER — Encounter: Payer: Self-pay | Admitting: Internal Medicine

## 2015-08-02 ENCOUNTER — Ambulatory Visit (INDEPENDENT_AMBULATORY_CARE_PROVIDER_SITE_OTHER): Payer: Medicare Other | Admitting: Internal Medicine

## 2015-08-02 ENCOUNTER — Other Ambulatory Visit: Payer: Self-pay

## 2015-08-02 VITALS — BP 118/82 | HR 87 | Temp 99.4°F | Ht 64.0 in | Wt 173.2 lb

## 2015-08-02 DIAGNOSIS — J218 Acute bronchiolitis due to other specified organisms: Secondary | ICD-10-CM

## 2015-08-02 DIAGNOSIS — R1310 Dysphagia, unspecified: Secondary | ICD-10-CM | POA: Diagnosis not present

## 2015-08-02 MED ORDER — ESOMEPRAZOLE MAGNESIUM 40 MG PO CPDR: 40.0000 mg | DELAYED_RELEASE_CAPSULE | Freq: Two times a day (BID) | ORAL | Status: DC

## 2015-08-02 NOTE — Patient Instructions (Signed)
Schedule EGD with esophageal dilation - dysphagia / GERD - propofol  Increase Nexium to 40 mg twice daily - [facility] -#60 x 2 months refill  Clear liquid supper the night before EGD  Take Metformin in the morning only - day before procedure  Decrease night time insulin to 12 units the night before procedure  Good Job on weight loss!!

## 2015-08-02 NOTE — Progress Notes (Signed)
Primary Care Physician:  Duwaine Maxin, DO Primary Gastroenterologist:  Dr. Gala Romney  Pre-Procedure History & Physical: HPI:  [name] is a 50 y.o. female here for followup of GERD. Last seen one year ago. She has historically required Nexium 40 mg twice daily to control her symptoms. Since I last saw her, she has accomplished a deliberate weight loss of 35 pounds  to become more healthy. Her PCP cut Nexium to 40 mg once daily. She states that she has daily flares of typical reflux symptoms; she describes heartburn. In addition, she now has recurrent esophageal dysphagia to solids. History of a Schatzki's ring/stricture requiring dilation 2009 at 2013. States her blood sugars are in much better control these days. Hemoglobin A1c he reportedly in the 6 range. I see a level of 7.2 about a year ago. Hasn't had any nausea, vomiting, early satiety. No melena or rectal bleeding. History of iron deficiency anemia apparently resolved.  She continues to utilize nonsteroidal agents on a regular basis for musculoskeletal pain. Colonoscopy in 2013 performed by me revealed diverticulosis and hemorrhoids.  Past Medical History  Diagnosis Date  . Arteriosclerotic cardiovascular disease (ASCVD)     Minimal at cath in [facility].stress nuclear study in 8/08 with nl EF; neg stress echo in 2010  . Diabetes mellitus, type 2 ([place]) 2000    Onset in 2000; no insulin  . Hyperlipidemia   . Hypertension `    during treatment with Geodon  . Gastroesophageal reflux disease     Schatzki's ring  . Anemia, iron deficiency   . Alcohol abuse   . Depression   . Community acquired pneumonia 01/03/10, 05/2010, 04/2012    2011; with pleural effusion-[facility] acute resp failure; intubated in Jan 2014 (HMPV pneumonia)  . Obesity   . Schizoaffective disorder     requiring multiple psychiatric admissions  . Dysphagia   . Diastolic dysfunction     grade 2 per echo 2011  . Pulmonary hypertension ([place])  05/02/2012    Patient needs repeat echo in 06/2012   . History of alcohol abuse 07/22/2007    Qualifier: Diagnosis of  By: Lenn Cal    . PTSD (post-traumatic stress disorder)     Past Surgical History  Procedure Laterality Date  . Dilation and curettage, diagnostic / therapeutic  1992  . Esophagogastroduodenoscopy  09/16/08    Dr. Trevor Iha hiatal hernia/excoriations involving the cardia and mucosa consistent with trauma, antral erosions  of linear petechiae ? gastritis versus early gastric antral vascular  ectasia.Marland Kitchen biopsy showed reactive gastropathy. No H. pylori.  . Esophagogastroduodenoscopy  09/2007    Dr. Evalee Mutton ring, dilated to 43 French Maloney dilator, small hiatal hernia, antral erosions, biopsies reactive gastropathy.  Azzie Almas dilation  07/17/2011    [facility]-MAC sedation-->distal esophageal stricture s/p dilation, chronic gastritis, multiple ulcers in stomach. no h.pylori  . Colonoscopy  01/2006    internal hemorrhoids  . Colonoscopy  01/10/2012    Dr. Kalise Fickett:Single anal canal hemorrhoidal tag likely source of  trivial hematochezia; right-sided colonic diverticulosis  . Esophagogastroduodenoscopy (egd) with propofol N/A 12/17/2013    [id number] antral erosions and petechiae. Small hiatal hernia. No endoscopic explanation for patient's symptoms    Prior to Admission medications   Medication Sig Start Date End Date Taking? Authorizing Provider  acetaminophen (TYLENOL) 500 MG tablet Take 1,000 mg by mouth every 6 (six) hours as needed for mild pain.   Yes Historical Provider, MD  albuterol (PROVENTIL HFA;VENTOLIN HFA) 108 (90  BASE) MCG/ACT inhaler Inhale 2 puffs into the lungs every 4 (four) hours as needed for wheezing or shortness of breath. 02/22/15  Yes Lysbeth Penner, FNP  atorvastatin (LIPITOR) 80 MG tablet Take 1 tablet (80 mg total) by mouth daily. 05/13/15  Yes Alex Ronnie Derby, DO  benzonatate (TESSALON) 100 MG capsule Take 2 capsules (200 mg total) by mouth 3  (three) times daily as needed for cough. 06/17/15  Yes Ejiroghene Arlyce Dice, MD  esomeprazole (NEXIUM) 40 MG capsule Take 1 capsule (40 mg total) by mouth 2 (two) times daily before a meal. 07/08/15  Yes Mahala Menghini, PA-C  furosemide (LASIX) 20 MG tablet Take 1 tablet (20 mg total) by mouth daily. 05/11/15  Yes Francesca Oman, DO  gabapentin (NEURONTIN) 100 MG capsule Take 100 mg by mouth 2 (two) times daily. 07/28/15  Yes Historical Provider, MD  guaiFENesin-codeine 100-10 MG/5ML syrup Take 10 mLs by mouth every 6 (six) hours as needed for cough. 07/30/15  Yes Forde Dandy, MD  ibuprofen (ADVIL,MOTRIN) 200 MG tablet Take 400 mg by mouth every 6 (six) hours as needed for mild pain.   Yes Historical Provider, MD  Insulin Glargine (LANTUS SOLOSTAR) 100 UNIT/ML Solostar Pen INJECT 16 UNITS SUBCUTANEOUSLY AT BEDTIME. 05/13/15  Yes Francesca Oman, DO  lisinopril (PRINIVIL,ZESTRIL) 40 MG tablet Take 1 tablet (40 mg total) by mouth daily. 05/11/15  Yes Alex Ronnie Derby, DO  loratadine (CLARITIN) 10 MG tablet Take 1 tablet (10 mg total) by mouth daily. 04/21/15  Yes Alex Ronnie Derby, DO  lovastatin (MEVACOR) 20 MG tablet Take 20 mg by mouth every evening. 06/23/15  Yes Historical Provider, MD  metFORMIN (GLUCOPHAGE) 1000 MG tablet TAKE (1) TABLET BY MOUTH TWICE DAILY WITH MEALS. 11/01/14  Yes Francesca Oman, DO  metoprolol (LOPRESSOR) 100 MG tablet Take 1 tablet (100 mg total) by mouth 2 (two) times daily. 05/11/15  Yes Alex Ronnie Derby, DO  mometasone (NASONEX) 50 MCG/ACT nasal spray Place 2 sprays into the nose daily. 05/13/15  Yes Francesca Oman, DO  Multiple Vitamins-Minerals (MULTIVITAMIN WITH MINERALS) tablet Take 1 tablet by mouth daily.   Yes Historical Provider, MD  nitroGLYCERIN (NITROSTAT) 0.3 MG SL tablet Place 1 tablet (0.3 mg total) under the tongue every 5 (five) minutes as needed for chest pain. 12/09/14  Yes Lendon Colonel, NP  ondansetron (ZOFRAN) 4 MG tablet Take 1 tablet (4 mg total) by mouth every 8 (eight)  hours as needed for nausea or vomiting. 04/16/15  Yes Daleen Bo, MD  potassium chloride SA (K-DUR,KLOR-CON) 20 MEQ tablet Take 1 tablet (20 mEq total) by mouth daily. 05/11/15  Yes Francesca Oman, DO  ziprasidone (GEODON) 80 MG capsule Take 160 mg by mouth daily.   Yes Historical Provider, MD  ACCU-CHEK FASTCLIX LANCETS MISC Use to check blood sugar 1 to 2 times daily. diag code E11.9. Insulin dependent 05/06/15   Francesca Oman, DO  busPIRone (BUSPAR) 10 MG tablet Take 10 mg by mouth 2 (two) times daily. 06/25/15   Historical Provider, MD  glucose blood (ACCU-CHEK SMARTVIEW) test strip Use to check blood sugar 1 to 2 times daily. diag code E11.9. Insulin dependent. 05/06/15   Francesca Oman, DO  NOVOFINE 32G X 6 MM MISC Use to inject insulin one time daily. Dx: E11.65 12/30/14   Francesca Oman, DO    Allergies as of 08/02/2015 - Review Complete 08/02/2015  Allergen Reaction Noted  . Cephalexin  10/14/2013  .  Metronidazole Shortness Of Breath and Swelling   . Orange Itching 03/27/2011  . Shrimp [shellfish allergy] Shortness Of Breath and Itching 03/27/2011  . Penicillins Hives and Swelling   . Sulfonamide derivatives Hives   . Glipizide Other (See Comments) 01/15/2011  . Sulfamethoxazole-trimethoprim Rash 02/27/2010    Family History  Problem Relation Age of Onset  . Colon cancer Other   . Hypertension Mother   . Stroke Father     deceased at age 63  . Heart disease Sister   . Anesthesia problems Neg Hx   . Hypotension Neg Hx   . Malignant hyperthermia Neg Hx   . Pseudochol deficiency Neg Hx   . Diabetes    . High Cholesterol    . Arthritis      Social History   Social History  . Marital Status: Single    Spouse Name: N/A  . Number of Children: 2  . Years of Education: some colge   Occupational History  . Disability   . UNEMPLOYED    Social History Main Topics  . Smoking status: Current Every Day Smoker -- 1.00 packs/day for 30 years    Types: Cigarettes    Start date:  05/18/2012  . Smokeless tobacco: Never Used     Comment: cutting back,  . Alcohol Use: No     Comment: hx of ETOH abuse  . Drug Use: No  . Sexual Activity: No   Other Topics Concern  . 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 Eyes:  Sclera clear, no icterus.   Conjunctiva pink. Neck:  Supple; no masses or thyromegaly. No significant cervical adenopathy. Lungs:  Clear throughout to auscultation.   No wheezes, crackles, or rhonchi. No acute distress. Heart:  Regular rate and rhythm; no murmurs, clicks, rubs,  or gallops. Abdomen: Non-distended, normal bowel sounds.  Soft and nontender without appreciable mass or hepatosplenomegaly.  Pulses:  Normal pulses noted. Extremities:  Without clubbing or edema.  Impression:  Pleasant 50 year old lady with multiple co-morbidities now presenting with breakthrough GERD symptoms on once daily PPI therapy in the way of Nexium 40 mg. Also, now reports recurrent esophageal dysphagia in the setting of a known esophageal stricture/Schatzki's ring. I'm somewhat surprised she is not able to get along with once daily PPI therapy given her significant would typically be expected to improve GERD. She certainly could have an element of functional GERD (NERD).  Nexium may simply be associated with decreased efficacy in this particular clinical situation.  Other contributing  possibilities could be an element of gastroparesis exacerbating underlying GERD. Dysphagia requires further evaluation.  She was commended for weight loss!   Recommendations:  For the time being, I am awaiting to bump her Nexium back up to 40 mg twice daily. I have offered her diagnostic EGD with esophageal dilation, etc. as feasible/appropriate  in the near future the hospital.   Schedule EGD with esophageal dilation - dysphagia / GERD.  Because of polypharmacy and her other co-morbidities will sedate with propofol.  The risks, benefits, limitations, alternatives and imponderables have been reviewed with the patient. Potential for esophageal dilation, biopsy, etc. have also been reviewed.  Questions have been answered. All parties agreeable.  Clear liquid supper the night before EGD  Patient take only morning dose of metformin day before the procedure. Patient is also to decrease insulin to 12 units the evening before. Take Metformin in the morning only - day before procedure   Notice: This dictation was prepared with Dragon dictation along with smaller phrase technology. Any transcriptional errors that result from this process are unintentional and may not be corrected upon review.

## 2015-08-16 ENCOUNTER — Encounter (HOSPITAL_COMMUNITY)
Admission: RE | Admit: 2015-08-16 | Discharge: 2015-08-16 | Disposition: A | Discharge status: Home / Self Care | Payer: Medicare Other | Source: Ambulatory Visit | Attending: Internal Medicine | Admitting: Internal Medicine

## 2015-08-18 ENCOUNTER — Encounter (HOSPITAL_COMMUNITY): Payer: Self-pay | Admitting: *Deleted

## 2015-08-18 ENCOUNTER — Ambulatory Visit (HOSPITAL_COMMUNITY): Payer: Medicare Other | Admitting: Anesthesiology

## 2015-08-18 ENCOUNTER — Ambulatory Visit (HOSPITAL_COMMUNITY)
Admission: RE | Admit: 2015-08-18 | Discharge: 2015-08-18 | Disposition: A | Discharge status: Home / Self Care | Payer: Medicare Other | Source: Ambulatory Visit | Attending: Internal Medicine | Admitting: Internal Medicine

## 2015-08-18 ENCOUNTER — Encounter (HOSPITAL_COMMUNITY)
Admission: RE | Disposition: A | Discharge status: Home / Self Care | Payer: Self-pay | Source: Ambulatory Visit | Attending: Internal Medicine

## 2015-08-18 DIAGNOSIS — F4310 Post-traumatic stress disorder, unspecified: Secondary | ICD-10-CM | POA: Insufficient documentation

## 2015-08-18 DIAGNOSIS — E119 Type 2 diabetes mellitus without complications: Secondary | ICD-10-CM | POA: Insufficient documentation

## 2015-08-18 DIAGNOSIS — Z6829 Body mass index (BMI) 29.0-29.9, adult: Secondary | ICD-10-CM | POA: Diagnosis not present

## 2015-08-18 DIAGNOSIS — F259 Schizoaffective disorder, unspecified: Secondary | ICD-10-CM | POA: Insufficient documentation

## 2015-08-18 DIAGNOSIS — F329 Major depressive disorder, single episode, unspecified: Secondary | ICD-10-CM | POA: Insufficient documentation

## 2015-08-18 DIAGNOSIS — E785 Hyperlipidemia, unspecified: Secondary | ICD-10-CM | POA: Diagnosis not present

## 2015-08-18 DIAGNOSIS — Z794 Long term (current) use of insulin: Secondary | ICD-10-CM | POA: Insufficient documentation

## 2015-08-18 DIAGNOSIS — D509 Iron deficiency anemia, unspecified: Secondary | ICD-10-CM | POA: Insufficient documentation

## 2015-08-18 DIAGNOSIS — R1319 Other dysphagia: Secondary | ICD-10-CM | POA: Insufficient documentation

## 2015-08-18 DIAGNOSIS — R1310 Dysphagia, unspecified: Secondary | ICD-10-CM | POA: Insufficient documentation

## 2015-08-18 DIAGNOSIS — K219 Gastro-esophageal reflux disease without esophagitis: Secondary | ICD-10-CM | POA: Diagnosis not present

## 2015-08-18 DIAGNOSIS — I10 Essential (primary) hypertension: Secondary | ICD-10-CM | POA: Diagnosis not present

## 2015-08-18 DIAGNOSIS — F17210 Nicotine dependence, cigarettes, uncomplicated: Secondary | ICD-10-CM | POA: Diagnosis not present

## 2015-08-18 DIAGNOSIS — E669 Obesity, unspecified: Secondary | ICD-10-CM | POA: Diagnosis not present

## 2015-08-18 DIAGNOSIS — Z79899 Other long term (current) drug therapy: Secondary | ICD-10-CM | POA: Diagnosis not present

## 2015-08-18 DIAGNOSIS — F319 Bipolar disorder, unspecified: Secondary | ICD-10-CM | POA: Insufficient documentation

## 2015-08-18 DIAGNOSIS — I272 Other secondary pulmonary hypertension: Secondary | ICD-10-CM | POA: Diagnosis not present

## 2015-08-18 DIAGNOSIS — J449 Chronic obstructive pulmonary disease, unspecified: Secondary | ICD-10-CM | POA: Diagnosis not present

## 2015-08-18 HISTORY — PX: ESOPHAGOGASTRODUODENOSCOPY (EGD) WITH PROPOFOL: SHX5813

## 2015-08-18 HISTORY — PX: ESOPHAGEAL DILATION: SHX303

## 2015-08-18 LAB — GLUCOSE, CAPILLARY
GLUCOSE-CAPILLARY: 112 mg/dL — AB (ref 65–99)
Glucose-Capillary: 104 mg/dL — ABNORMAL HIGH (ref 65–99)

## 2015-08-18 SURGERY — ESOPHAGOGASTRODUODENOSCOPY (EGD) WITH PROPOFOL
Anesthesia: Monitor Anesthesia Care

## 2015-08-18 MED ORDER — PROPOFOL 10 MG/ML IV BOLUS
INTRAVENOUS | Status: DC | PRN
  Administered 2015-08-18 (×3): 10 mg via INTRAVENOUS

## 2015-08-18 MED ORDER — MIDAZOLAM HCL 2 MG/2ML IJ SOLN
1.0000 mg | INTRAMUSCULAR | Status: DC | PRN
Start: 2015-08-18 — End: 2015-08-18
  Administered 2015-08-18: 2 mg via INTRAVENOUS
  Filled 2015-08-18: qty 2

## 2015-08-18 MED ORDER — LACTATED RINGERS IV SOLN
INTRAVENOUS | Status: DC
  Administered 2015-08-18: 1000 mL via INTRAVENOUS

## 2015-08-18 MED ORDER — FENTANYL CITRATE (PF) 100 MCG/2ML IJ SOLN
INTRAMUSCULAR | Status: AC
  Filled 2015-08-18: qty 2

## 2015-08-18 MED ORDER — ONDANSETRON HCL 4 MG/2ML IJ SOLN
INTRAMUSCULAR | Status: AC
  Filled 2015-08-18: qty 2

## 2015-08-18 MED ORDER — PROPOFOL 500 MG/50ML IV EMUL
INTRAVENOUS | Status: DC | PRN
  Administered 2015-08-18: 150 ug/kg/min via INTRAVENOUS

## 2015-08-18 MED ORDER — ONDANSETRON HCL 4 MG/2ML IJ SOLN
4.0000 mg | Freq: Once | INTRAMUSCULAR | Status: AC
  Administered 2015-08-18: 4 mg via INTRAVENOUS

## 2015-08-18 MED ORDER — ONDANSETRON HCL 4 MG/2ML IJ SOLN: 4.0000 mg | Freq: Once | INTRAMUSCULAR | Status: DC | PRN

## 2015-08-18 MED ORDER — FENTANYL CITRATE (PF) 100 MCG/2ML IJ SOLN
25.0000 ug | INTRAMUSCULAR | Status: AC
  Administered 2015-08-18 (×2): 25 ug via INTRAVENOUS

## 2015-08-18 MED ORDER — LIDOCAINE VISCOUS 2 % MT SOLN
OROMUCOSAL | Status: AC
  Filled 2015-08-18: qty 15

## 2015-08-18 MED ORDER — FENTANYL CITRATE (PF) 100 MCG/2ML IJ SOLN: 25.0000 ug | INTRAMUSCULAR | Status: DC | PRN

## 2015-08-18 MED ORDER — LIDOCAINE VISCOUS 2 % MT SOLN
3.0000 mL | Freq: Two times a day (BID) | OROMUCOSAL | Status: AC | PRN
  Administered 2015-08-18 (×2): 3 mL via OROMUCOSAL

## 2015-08-18 NOTE — Anesthesia Preprocedure Evaluation (Signed)
Anesthesia Evaluation  Patient identified by MRN, date of birth, ID band Patient awake    Reviewed: Allergy & Precautions, H&P , NPO status , Patient's Chart, lab work & pertinent test results  History of Anesthesia Complications Negative for: history of anesthetic complications  Airway Mallampati: II  TM Distance: >3 FB     Dental  (+) Teeth Intact   Pulmonary Pneumonia: Feb. 2012., COPD, Current Smoker,    breath sounds clear to auscultation       Cardiovascular hypertension, Pt. on medications  Rhythm:Regular Rate:Normal     Neuro/Psych PSYCHIATRIC DISORDERS (hx PTSD) Depression Bipolar Disorder    GI/Hepatic GERD  Medicated,(+)     substance abuse  alcohol use,   Endo/Other  diabetes, Type 2, Insulin Dependent, Oral Hypoglycemic Agents  Renal/GU      Musculoskeletal   Abdominal   Peds  Hematology   Anesthesia Other Findings   Reproductive/Obstetrics                             Anesthesia Physical Anesthesia Plan  ASA: III  Anesthesia Plan: MAC   Post-op Pain Management:    Induction: Intravenous  Airway Management Planned: Simple Face Mask  Additional Equipment:   Intra-op Plan:   Post-operative Plan:   Informed Consent: I have reviewed the patients History and Physical, chart, labs and discussed the procedure including the risks, benefits and alternatives for the proposed anesthesia with the patient or authorized representative who has indicated his/her understanding and acceptance.     Plan Discussed with:   Anesthesia Plan Comments:         Anesthesia Quick Evaluation

## 2015-08-18 NOTE — Discharge Instructions (Signed)
EGD Discharge instructions Please read the instructions outlined below and refer to this sheet in the next few weeks. These discharge instructions provide you with general information on caring for yourself after you leave the hospital. Your doctor may also give you specific instructions. While your treatment has been planned according to the most current medical practices available, unavoidable complications occasionally occur. If you have any problems or questions after discharge, please call your doctor. ACTIVITY  You may resume your regular activity but move at a slower pace for the next 24 hours.   Take frequent rest periods for the next 24 hours.   Walking will help expel (get rid of) the air and reduce the bloated feeling in your abdomen.   No driving for 24 hours (because of the anesthesia (medicine) used during the test).   You may shower.   Do not sign any important legal documents or operate any machinery for 24 hours (because of the anesthesia used during the test).  NUTRITION  Drink plenty of fluids.   You may resume your normal diet.   Begin with a light meal and progress to your normal diet.   Avoid alcoholic beverages for 24 hours or as instructed by your caregiver.  MEDICATIONS  You may resume your normal medications unless your caregiver tells you otherwise.  WHAT YOU CAN EXPECT TODAY  You may experience abdominal discomfort such as a feeling of fullness or gas pains.  FOLLOW-UP  Your doctor will discuss the results of your test with you.  SEEK IMMEDIATE MEDICAL ATTENTION IF ANY OF THE FOLLOWING OCCUR:  Excessive nausea (feeling sick to your stomach) and/or vomiting.   Severe abdominal pain and distention (swelling).   Trouble swallowing.   Temperature over 101 F (37.8 C).   Rectal bleeding or vomiting of blood.     Continue Nexium 40 mg twice daily  Office visit with [name] in 3 months

## 2015-08-18 NOTE — Interval H&P Note (Signed)
History and Physical Interval Note:  08/18/2015 7:57 AM  [name]  has presented today for surgery, with the diagnosis of dysphagia, GERD  The various methods of treatment have been discussed with the patient and family. After consideration of risks, benefits and other options for treatment, the patient has consented to  Procedure(s) with comments: ESOPHAGOGASTRODUODENOSCOPY (EGD) WITH PROPOFOL (N/A) - 0845 ESOPHAGEAL DILATION (N/A) as a surgical intervention .  The patient's history has been reviewed, patient examined, no change in status, stable for surgery.  I have reviewed the patient's chart and labs.  Questions were answered to the patient's satisfaction.     Robert Rourk  No change; EGD/ed per plan.  The risks, benefits, limitations, alternatives and imponderables have been reviewed with the patient. Potential for esophageal dilation, biopsy, etc. have also been reviewed.  Questions have been answered. All parties agreeable.

## 2015-08-18 NOTE — Op Note (Signed)
[facility] Patient Name: [name] Procedure Date: 08/18/2015 7:53 AM MRN: [id number] Date of Birth: 08-Aug-1965 Attending MD: Norvel Richards , MD CSN: [id number] Age: 50 Admit Type: Outpatient Procedure:                Upper GI endoscopy Indications:              Dysphagia Providers:                Norvel Richards, MD, Lurline Del, RN, Randa Spike, Technician Referring MD:              Medicines:                Monitored Anesthesia Care Complications:            No immediate complications. Estimated Blood Loss:     Estimated blood loss was minimal. Procedure:                Pre-Anesthesia Assessment:                           - Prior to the procedure, a History and Physical                            was performed, and patient medications and                            allergies were reviewed. The patient's tolerance of                            previous anesthesia was also reviewed. The risks                            and benefits of the procedure and the sedation                            options and risks were discussed with the patient.                            All questions were answered, and informed consent                            was obtained. ASA Grade Assessment: II - A patient                            with mild systemic disease. After reviewing the                            risks and benefits, the patient was deemed in                            satisfactory condition to undergo the procedure.  After obtaining informed consent, the endoscope was                            passed under direct vision. Throughout the                            procedure, the patient's blood pressure, pulse, and                            oxygen saturations were monitored continuously. The                            [id number] scope was introduced through the                            mouth, and  advanced to the second part of duodenum.                            The upper GI endoscopy was accomplished without                            difficulty. The patient tolerated the procedure                            well. Scope In: 8:14:19 AM Scope Out: 8:23:04 AM Total Procedure Duration: 0 hours 8 minutes 45 seconds  Findings:      The examined esophagus was normal. The scope was withdrawn.      The entire examined stomach was normal.      The second portion of the duodenum was normal. Dilation was performed       with a Maloney dilator with no resistance at 70 Fr. The dilation site       was examined following endoscope reinsertion and showed no change.       Estimated blood loss was minimal. Impression:               - Normal esophagus. Dilated.                           - Normal stomach.                           - Normal second portion of the duodenum.                           - No specimens collected. Moderate Sedation:      Moderate (conscious) sedation was personally administered by an       anesthesia professional. The following parameters were monitored: oxygen       saturation, heart rate, blood pressure, respiratory rate, EKG, adequacy       of pulmonary ventilation, and response to care. Total physician       intraservice time was 15 minutes. Recommendation:           - Patient has a contact number available for  emergencies. The signs and symptoms of potential                            delayed complications were discussed with the                            patient. Return to normal activities tomorrow.                            Written discharge instructions were provided to the                            patient.                           - Advance diet as tolerated.                           - Continue present medications. Continue Nexium 40                            mg twice daily.                           - Return to GI office in 3  months. Procedure Code(s):        --- Professional ---                           [phone], Esophagogastroduodenoscopy, flexible,                            transoral; diagnostic, including collection of                            specimen(s) by brushing or washing, when performed                            (separate procedure)                           43450, Dilation of esophagus, by unguided sound or                            bougie, single or multiple passes Diagnosis Code(s):        --- Professional ---                           R13.10, Dysphagia, unspecified CPT copyright 2016 American Medical Association. All rights reserved. The codes documented in this report are preliminary and upon coder review may  be revised to meet current compliance requirements. Cristopher Estimable. Amel Gianino, MD Norvel Richards, MD 08/18/2015 8:39:08 AM This report has been signed electronically. Number of Addenda: 0

## 2015-08-18 NOTE — Transfer of Care (Signed)
Immediate Anesthesia Transfer of Care Note  Patient: [name]  Procedure(s) Performed: Procedure(s) with comments: ESOPHAGOGASTRODUODENOSCOPY (EGD) WITH PROPOFOL (N/A) - 0845 ESOPHAGEAL DILATION (N/A)  Patient Location: PACU  Anesthesia Type:MAC  Level of Consciousness: awake and alert   Airway & Oxygen Therapy: Patient Spontanous Breathing and Patient connected to nasal cannula oxygen  Post-op Assessment: Report given to RN  Post vital signs: Reviewed and stable  Last Vitals:  Filed Vitals:   08/18/15 0755 08/18/15 0800  BP: 115/73 112/68  Temp:    Resp: 21 18    Last Pain: There were no vitals filed for this visit.    Patients Stated Pain Goal: 7 ([id number])  Complications: No apparent anesthesia complications

## 2015-08-18 NOTE — H&P (View-Only) (Signed)
Primary Care Physician:  Duwaine Maxin, DO Primary Gastroenterologist:  Dr. Gala Romney  Pre-Procedure History & Physical: HPI:  [name] is a 50 y.o. female here for followup of GERD. Last seen one year ago. She has historically required Nexium 40 mg twice daily to control her symptoms. Since I last saw her, she has accomplished a deliberate weight loss of 35 pounds  to become more healthy. Her PCP cut Nexium to 40 mg once daily. She states that she has daily flares of typical reflux symptoms; she describes heartburn. In addition, she now has recurrent esophageal dysphagia to solids. History of a Schatzki's ring/stricture requiring dilation 2009 at 2013. States her blood sugars are in much better control these days. Hemoglobin A1c he reportedly in the 6 range. I see a level of 7.2 about a year ago. Hasn't had any nausea, vomiting, early satiety. No melena or rectal bleeding. History of iron deficiency anemia apparently resolved.  She continues to utilize nonsteroidal agents on a regular basis for musculoskeletal pain. Colonoscopy in 2013 performed by me revealed diverticulosis and hemorrhoids.  Past Medical History  Diagnosis Date  . Arteriosclerotic cardiovascular disease (ASCVD)     Minimal at cath in [facility]stress nuclear study in 8/08 with nl EF; neg stress echo in 2010  . Diabetes mellitus, type 2 ([place]) 2000    Onset in 2000; no insulin  . Hyperlipidemia   . Hypertension `    during treatment with Geodon  . Gastroesophageal reflux disease     Schatzki's ring  . Anemia, iron deficiency   . Alcohol abuse   . Depression   . Community acquired pneumonia 01/03/10, 05/2010, 04/2012    2011; with pleural effusion-[facility] acute resp failure; intubated in Jan 2014 (HMPV pneumonia)  . Obesity   . Schizoaffective disorder     requiring multiple psychiatric admissions  . Dysphagia   . Diastolic dysfunction     grade 2 per echo 2011  . Pulmonary hypertension ([place])  05/02/2012    Patient needs repeat echo in 06/2012   . History of alcohol abuse 07/22/2007    Qualifier: Diagnosis of  By: Lenn Cal    . PTSD (post-traumatic stress disorder)     Past Surgical History  Procedure Laterality Date  . Dilation and curettage, diagnostic / therapeutic  1992  . Esophagogastroduodenoscopy  09/16/08    Dr. Trevor Iha hiatal hernia/excoriations involving the cardia and mucosa consistent with trauma, antral erosions  of linear petechiae ? gastritis versus early gastric antral vascular  ectasia.Marland Kitchen biopsy showed reactive gastropathy. No H. pylori.  . Esophagogastroduodenoscopy  09/2007    Dr. Evalee Mutton ring, dilated to 65 French Maloney dilator, small hiatal hernia, antral erosions, biopsies reactive gastropathy.  Azzie Almas dilation  07/17/2011    [facility] sedation-->distal esophageal stricture s/p dilation, chronic gastritis, multiple ulcers in stomach. no h.pylori  . Colonoscopy  01/2006    internal hemorrhoids  . Colonoscopy  01/10/2012    Dr. Sarah Zerby:Single anal canal hemorrhoidal tag likely source of  trivial hematochezia; right-sided colonic diverticulosis  . Esophagogastroduodenoscopy (egd) with propofol N/A 12/17/2013    [id number] antral erosions and petechiae. Small hiatal hernia. No endoscopic explanation for patient's symptoms    Prior to Admission medications   Medication Sig Start Date End Date Taking? Authorizing Provider  acetaminophen (TYLENOL) 500 MG tablet Take 1,000 mg by mouth every 6 (six) hours as needed for mild pain.   Yes Historical Provider, MD  albuterol (PROVENTIL HFA;VENTOLIN HFA) 108 (90  BASE) MCG/ACT inhaler Inhale 2 puffs into the lungs every 4 (four) hours as needed for wheezing or shortness of breath. 02/22/15  Yes Lysbeth Penner, FNP  atorvastatin (LIPITOR) 80 MG tablet Take 1 tablet (80 mg total) by mouth daily. 05/13/15  Yes Alex Ronnie Derby, DO  benzonatate (TESSALON) 100 MG capsule Take 2 capsules (200 mg total) by mouth 3  (three) times daily as needed for cough. 06/17/15  Yes Ejiroghene Arlyce Dice, MD  esomeprazole (NEXIUM) 40 MG capsule Take 1 capsule (40 mg total) by mouth 2 (two) times daily before a meal. 07/08/15  Yes Mahala Menghini, PA-C  furosemide (LASIX) 20 MG tablet Take 1 tablet (20 mg total) by mouth daily. 05/11/15  Yes Francesca Oman, DO  gabapentin (NEURONTIN) 100 MG capsule Take 100 mg by mouth 2 (two) times daily. 07/28/15  Yes Historical Provider, MD  guaiFENesin-codeine 100-10 MG/5ML syrup Take 10 mLs by mouth every 6 (six) hours as needed for cough. 07/30/15  Yes Forde Dandy, MD  ibuprofen (ADVIL,MOTRIN) 200 MG tablet Take 400 mg by mouth every 6 (six) hours as needed for mild pain.   Yes Historical Provider, MD  Insulin Glargine (LANTUS SOLOSTAR) 100 UNIT/ML Solostar Pen INJECT 16 UNITS SUBCUTANEOUSLY AT BEDTIME. 05/13/15  Yes Francesca Oman, DO  lisinopril (PRINIVIL,ZESTRIL) 40 MG tablet Take 1 tablet (40 mg total) by mouth daily. 05/11/15  Yes Alex Ronnie Derby, DO  loratadine (CLARITIN) 10 MG tablet Take 1 tablet (10 mg total) by mouth daily. 04/21/15  Yes Alex Ronnie Derby, DO  lovastatin (MEVACOR) 20 MG tablet Take 20 mg by mouth every evening. 06/23/15  Yes Historical Provider, MD  metFORMIN (GLUCOPHAGE) 1000 MG tablet TAKE (1) TABLET BY MOUTH TWICE DAILY WITH MEALS. 11/01/14  Yes Francesca Oman, DO  metoprolol (LOPRESSOR) 100 MG tablet Take 1 tablet (100 mg total) by mouth 2 (two) times daily. 05/11/15  Yes Alex Ronnie Derby, DO  mometasone (NASONEX) 50 MCG/ACT nasal spray Place 2 sprays into the nose daily. 05/13/15  Yes Francesca Oman, DO  Multiple Vitamins-Minerals (MULTIVITAMIN WITH MINERALS) tablet Take 1 tablet by mouth daily.   Yes Historical Provider, MD  nitroGLYCERIN (NITROSTAT) 0.3 MG SL tablet Place 1 tablet (0.3 mg total) under the tongue every 5 (five) minutes as needed for chest pain. 12/09/14  Yes Lendon Colonel, NP  ondansetron (ZOFRAN) 4 MG tablet Take 1 tablet (4 mg total) by mouth every 8 (eight)  hours as needed for nausea or vomiting. 04/16/15  Yes Daleen Bo, MD  potassium chloride SA (K-DUR,KLOR-CON) 20 MEQ tablet Take 1 tablet (20 mEq total) by mouth daily. 05/11/15  Yes Francesca Oman, DO  ziprasidone (GEODON) 80 MG capsule Take 160 mg by mouth daily.   Yes Historical Provider, MD  ACCU-CHEK FASTCLIX LANCETS MISC Use to check blood sugar 1 to 2 times daily. diag code E11.9. Insulin dependent 05/06/15   Francesca Oman, DO  busPIRone (BUSPAR) 10 MG tablet Take 10 mg by mouth 2 (two) times daily. 06/25/15   Historical Provider, MD  glucose blood (ACCU-CHEK SMARTVIEW) test strip Use to check blood sugar 1 to 2 times daily. diag code E11.9. Insulin dependent. 05/06/15   Francesca Oman, DO  NOVOFINE 32G X 6 MM MISC Use to inject insulin one time daily. Dx: E11.65 12/30/14   Francesca Oman, DO    Allergies as of 08/02/2015 - Review Complete 08/02/2015  Allergen Reaction Noted  . Cephalexin  10/14/2013  .  Metronidazole Shortness Of Breath and Swelling   . Orange Itching 03/27/2011  . Shrimp [shellfish allergy] Shortness Of Breath and Itching 03/27/2011  . Penicillins Hives and Swelling   . Sulfonamide derivatives Hives   . Glipizide Other (See Comments) 01/15/2011  . Sulfamethoxazole-trimethoprim Rash 02/27/2010    Family History  Problem Relation Age of Onset  . Colon cancer Other   . Hypertension Mother   . Stroke Father     deceased at age 55  . Heart disease Sister   . Anesthesia problems Neg Hx   . Hypotension Neg Hx   . Malignant hyperthermia Neg Hx   . Pseudochol deficiency Neg Hx   . Diabetes    . High Cholesterol    . Arthritis      Social History   Social History  . Marital Status: Single    Spouse Name: N/A  . Number of Children: 2  . Years of Education: some colge   Occupational History  . Disability   . UNEMPLOYED    Social History Main Topics  . Smoking status: Current Every Day Smoker -- 1.00 packs/day for 30 years    Types: Cigarettes    Start date:  05/18/2012  . Smokeless tobacco: Never Used     Comment: cutting back,  . Alcohol Use: No     Comment: hx of ETOH abuse  . Drug Use: No  . Sexual Activity: No   Other Topics Concern  . 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 Eyes:  Sclera clear, no icterus.   Conjunctiva pink. Neck:  Supple; no masses or thyromegaly. No significant cervical adenopathy. Lungs:  Clear throughout to auscultation.   No wheezes, crackles, or rhonchi. No acute distress. Heart:  Regular rate and rhythm; no murmurs, clicks, rubs,  or gallops. Abdomen: Non-distended, normal bowel sounds.  Soft and nontender without appreciable mass or hepatosplenomegaly.  Pulses:  Normal pulses noted. Extremities:  Without clubbing or edema.  Impression:  Pleasant 50 year old lady with multiple co-morbidities now presenting with breakthrough GERD symptoms on once daily PPI therapy in the way of Nexium 40 mg. Also, now reports recurrent esophageal dysphagia in the setting of a known esophageal stricture/Schatzki's ring. I'm somewhat surprised she is not able to get along with once daily PPI therapy given her significant would typically be expected to improve GERD. She certainly could have an element of functional GERD (NERD).  Nexium may simply be associated with decreased efficacy in this particular clinical situation.  Other contributing  possibilities could be an element of gastroparesis exacerbating underlying GERD. Dysphagia requires further evaluation.  She was commended for weight loss!   Recommendations:  For the time being, I am awaiting to bump her Nexium back up to 40 mg twice daily. I have offered her diagnostic EGD with esophageal dilation, etc. as feasible/appropriate  in the near future the hospital.   Schedule EGD with esophageal dilation - dysphagia / GERD.  Because of polypharmacy and her other co-morbidities will sedate with propofol.  The risks, benefits, limitations, alternatives and imponderables have been reviewed with the patient. Potential for esophageal dilation, biopsy, etc. have also been reviewed.  Questions have been answered. All parties agreeable.  Clear liquid supper the night before EGD  Patient take only morning dose of metformin day before the procedure. Patient is also to decrease insulin to 12 units the evening before. Take Metformin in the morning only - day before procedure   Notice: This dictation was prepared with Dragon dictation along with smaller phrase technology. Any transcriptional errors that result from this process are unintentional and may not be corrected upon review.

## 2015-08-18 NOTE — Anesthesia Postprocedure Evaluation (Signed)
Anesthesia Post Note  Patient: [name]  Procedure(s) Performed: Procedure(s) (LRB): ESOPHAGOGASTRODUODENOSCOPY (EGD) WITH PROPOFOL (N/A) ESOPHAGEAL DILATION (N/A)  Patient location during evaluation: PACU Anesthesia Type: MAC Level of consciousness: awake and alert Pain management: pain level controlled Vital Signs Assessment: post-procedure vital signs reviewed and stable Respiratory status: spontaneous breathing Cardiovascular status: blood pressure returned to baseline Postop Assessment: no signs of nausea or vomiting Anesthetic complications: no    Last Vitals:  Filed Vitals:   08/18/15 0845 08/18/15 0901  BP: 100/61 126/79  Pulse: 67 65  Temp:  36.4 C  Resp: 26 20    Last Pain:  Filed Vitals:   08/18/15 0908  PainSc: 0-No pain                 Gerson Fauth

## 2015-08-23 ENCOUNTER — Encounter (HOSPITAL_COMMUNITY): Payer: Self-pay | Admitting: Internal Medicine

## 2015-09-26 ENCOUNTER — Other Ambulatory Visit: Payer: Self-pay | Admitting: Internal Medicine

## 2015-09-28 ENCOUNTER — Encounter: Payer: Self-pay | Admitting: *Deleted

## 2015-10-07 ENCOUNTER — Emergency Department (HOSPITAL_COMMUNITY)
Admission: EM | Admit: 2015-10-07 | Discharge: 2015-10-07 | Disposition: A | Discharge status: Home / Self Care | Payer: Medicare Other | Attending: Emergency Medicine | Admitting: Emergency Medicine

## 2015-10-07 ENCOUNTER — Encounter (HOSPITAL_COMMUNITY): Payer: Self-pay | Admitting: Emergency Medicine

## 2015-10-07 DIAGNOSIS — I10 Essential (primary) hypertension: Secondary | ICD-10-CM | POA: Insufficient documentation

## 2015-10-07 DIAGNOSIS — F259 Schizoaffective disorder, unspecified: Secondary | ICD-10-CM | POA: Diagnosis not present

## 2015-10-07 DIAGNOSIS — R197 Diarrhea, unspecified: Secondary | ICD-10-CM | POA: Insufficient documentation

## 2015-10-07 DIAGNOSIS — F329 Major depressive disorder, single episode, unspecified: Secondary | ICD-10-CM | POA: Diagnosis not present

## 2015-10-07 DIAGNOSIS — I2510 Atherosclerotic heart disease of native coronary artery without angina pectoris: Secondary | ICD-10-CM | POA: Diagnosis not present

## 2015-10-07 DIAGNOSIS — E785 Hyperlipidemia, unspecified: Secondary | ICD-10-CM | POA: Diagnosis not present

## 2015-10-07 DIAGNOSIS — R112 Nausea with vomiting, unspecified: Secondary | ICD-10-CM | POA: Diagnosis not present

## 2015-10-07 DIAGNOSIS — Z79899 Other long term (current) drug therapy: Secondary | ICD-10-CM | POA: Insufficient documentation

## 2015-10-07 DIAGNOSIS — F17210 Nicotine dependence, cigarettes, uncomplicated: Secondary | ICD-10-CM | POA: Insufficient documentation

## 2015-10-07 DIAGNOSIS — E669 Obesity, unspecified: Secondary | ICD-10-CM | POA: Insufficient documentation

## 2015-10-07 DIAGNOSIS — E119 Type 2 diabetes mellitus without complications: Secondary | ICD-10-CM | POA: Diagnosis not present

## 2015-10-07 DIAGNOSIS — R109 Unspecified abdominal pain: Secondary | ICD-10-CM | POA: Insufficient documentation

## 2015-10-07 DIAGNOSIS — Z794 Long term (current) use of insulin: Secondary | ICD-10-CM | POA: Insufficient documentation

## 2015-10-07 DIAGNOSIS — Z6831 Body mass index (BMI) 31.0-31.9, adult: Secondary | ICD-10-CM | POA: Diagnosis not present

## 2015-10-07 DIAGNOSIS — Z7984 Long term (current) use of oral hypoglycemic drugs: Secondary | ICD-10-CM | POA: Diagnosis not present

## 2015-10-07 LAB — CBC
HCT: 35.9 % — ABNORMAL LOW (ref 36.0–46.0)
Hemoglobin: 11.7 g/dL — ABNORMAL LOW (ref 12.0–15.0)
MCH: 27.5 pg (ref 26.0–34.0)
MCHC: 32.6 g/dL (ref 30.0–36.0)
MCV: 84.3 fL (ref 78.0–100.0)
Platelets: 256 10*3/uL (ref 150–400)
RBC: 4.26 MIL/uL (ref 3.87–5.11)
RDW: 15 % (ref 11.5–15.5)
WBC: 7.1 10*3/uL (ref 4.0–10.5)

## 2015-10-07 LAB — COMPREHENSIVE METABOLIC PANEL
ALT: 12 U/L — ABNORMAL LOW (ref 14–54)
AST: 16 U/L (ref 15–41)
Albumin: 3.7 g/dL (ref 3.5–5.0)
Alkaline Phosphatase: 63 U/L (ref 38–126)
Anion gap: 6 (ref 5–15)
BUN: 7 mg/dL (ref 6–20)
CO2: 25 mmol/L (ref 22–32)
Calcium: 9 mg/dL (ref 8.9–10.3)
Chloride: 104 mmol/L (ref 101–111)
Creatinine, Ser: 0.83 mg/dL (ref 0.44–1.00)
GFR calc Af Amer: >60 mL/min (ref >60)
GFR calc non Af Amer: >60 mL/min (ref >60)
Glucose, Bld: 93 mg/dL (ref 65–99)
Potassium: 3.7 mmol/L (ref 3.5–5.1)
Sodium: 135 mmol/L (ref 135–145)
Total Bilirubin: 0.4 mg/dL (ref 0.3–1.2)
Total Protein: 6.5 g/dL (ref 6.5–8.1)

## 2015-10-07 LAB — CBG MONITORING, ED: Glucose-Capillary: 103 mg/dL — ABNORMAL HIGH (ref 65–99)

## 2015-10-07 LAB — LIPASE, BLOOD: Lipase: 19 U/L (ref 11–51)

## 2015-10-07 MED ORDER — TRAMADOL HCL 50 MG PO TABS: 50.0000 mg | ORAL_TABLET | Freq: Four times a day (QID) | ORAL | Status: DC | PRN

## 2015-10-07 MED ORDER — ONDANSETRON HCL 4 MG/2ML IJ SOLN
4.0000 mg | Freq: Once | INTRAMUSCULAR | Status: AC
  Administered 2015-10-07: 4 mg via INTRAVENOUS
  Filled 2015-10-07: qty 2

## 2015-10-07 MED ORDER — SODIUM CHLORIDE 0.9 % IV BOLUS (SEPSIS)
1000.0000 mL | Freq: Once | INTRAVENOUS | Status: AC
  Administered 2015-10-07: 1000 mL via INTRAVENOUS

## 2015-10-07 MED ORDER — HYDROMORPHONE HCL 1 MG/ML IJ SOLN
0.7500 mg | Freq: Once | INTRAMUSCULAR | Status: AC
Start: 2015-10-07 — End: 2015-10-07
  Administered 2015-10-07: 0.75 mg via INTRAVENOUS
  Filled 2015-10-07: qty 1

## 2015-10-07 MED ORDER — ONDANSETRON HCL 4 MG PO TABS: 4.0000 mg | ORAL_TABLET | Freq: Four times a day (QID) | ORAL | Status: DC

## 2015-10-07 NOTE — ED Notes (Signed)
Having diarrhea for last week.  About 2-3 watery stools in last 24 hours.  Vomited once this am.  Denies any pain at this time.

## 2015-10-07 NOTE — Discharge Instructions (Signed)

## 2015-10-13 ENCOUNTER — Other Ambulatory Visit: Payer: Self-pay | Admitting: Internal Medicine

## 2015-10-13 NOTE — ED Provider Notes (Signed)
CSN: [id number]     Arrival date & time 10/07/15  1451 History   First MD Initiated Contact with Patient 10/07/15 1522     Chief Complaint  Patient presents with  . Diarrhea     (Consider location/radiation/quality/duration/timing/severity/associated sxs/prior Treatment) HPI   50 year old female with diarrhea. Onset about a week ago. Initially intermittent but in the last 24 hours she said 3-4 watery stools. Mild, crampy abdominal pain that comes and goes. Currently has none. Nauseated and vomited once this morning. No urinary complaints. No fevers or chills. No sick contacts. Has not tried taking anything for her symptoms. She denies any recent antibiotic usage.  Past Medical History  Diagnosis Date  . Arteriosclerotic cardiovascular disease (ASCVD)     Minimal at cath in [facility].stress nuclear study in 8/08 with nl EF; neg stress echo in 2010  . Diabetes mellitus, type 2 ([place]) 2000    Onset in 2000; no insulin  . Hyperlipidemia   . Hypertension `    during treatment with Geodon  . Gastroesophageal reflux disease     Schatzki's ring  . Anemia, iron deficiency   . Alcohol abuse   . Depression   . Community acquired pneumonia 01/03/10, 05/2010, 04/2012    2011; with pleural effusion-[facility] acute resp failure; intubated in Jan 2014 (HMPV pneumonia)  . Obesity   . Schizoaffective disorder     requiring multiple psychiatric admissions  . Dysphagia   . Diastolic dysfunction     grade 2 per echo 2011  . Pulmonary hypertension ([place]) 05/02/2012    Patient needs repeat echo in 06/2012   . History of alcohol abuse 07/22/2007    Qualifier: Diagnosis of  By: Lenn Cal    . PTSD (post-traumatic stress disorder)    Past Surgical History  Procedure Laterality Date  . Dilation and curettage, diagnostic / therapeutic  1992  . Esophagogastroduodenoscopy  09/16/08    Dr. Trevor Iha hiatal hernia/excoriations involving the cardia and mucosa consistent with trauma, antral  erosions  of linear petechiae ? gastritis versus early gastric antral vascular  ectasia.Marland Kitchen biopsy showed reactive gastropathy. No H. pylori.  . Esophagogastroduodenoscopy  09/2007    Dr. Evalee Mutton ring, dilated to 7 French Maloney dilator, small hiatal hernia, antral erosions, biopsies reactive gastropathy.  Azzie Almas dilation  07/17/2011    [facility] sedation-->distal esophageal stricture s/p dilation, chronic gastritis, multiple ulcers in stomach. no h.pylori  . Colonoscopy  01/2006    internal hemorrhoids  . Colonoscopy  01/10/2012    Dr. Rourk:Single anal canal hemorrhoidal tag likely source of  trivial hematochezia; right-sided colonic diverticulosis  . Esophagogastroduodenoscopy (egd) with propofol N/A 12/17/2013    [id number] antral erosions and petechiae. Small hiatal hernia. No endoscopic explanation for patient's symptoms  . Esophagogastroduodenoscopy (egd) with propofol N/A 08/18/2015    Procedure: ESOPHAGOGASTRODUODENOSCOPY (EGD) WITH PROPOFOL;  Surgeon: Daneil Dolin, MD;  [facility];  Service: Endoscopy;  Laterality: N/A;  0845  . Esophageal dilation N/A 08/18/2015    Procedure: ESOPHAGEAL DILATION;  Surgeon: Daneil Dolin, MD;  Location: [facility];  Service: Endoscopy;  Laterality: N/A;   Family History  Problem Relation Age of Onset  . Colon cancer Other   . Hypertension Mother   . Stroke Father     deceased at age 54  . Heart disease Sister   . Anesthesia problems Neg Hx   . Hypotension Neg Hx   . Malignant hyperthermia Neg Hx   . Pseudochol deficiency Neg  Hx   . Diabetes    . High Cholesterol    . Arthritis     Social History  Substance Use Topics  . Smoking status: Current Every Day Smoker -- 1.00 packs/day for 30 years    Types: Cigarettes    Start date: 05/18/2012  . Smokeless tobacco: Never Used     Comment: cutting back,  . Alcohol Use: No     Comment: hx of ETOH abuse   OB History    Gravida Para Term Preterm AB TAB SAB Ectopic  Multiple Living   3 2 2  1 1    2      Review of Systems    Allergies  Cephalexin; Metronidazole; Orange; Shrimp; Penicillins; Sulfonamide derivatives; Glipizide; and Sulfamethoxazole-trimethoprim  Home Medications   Prior to Admission medications   Medication Sig Start Date End Date Taking? Authorizing Provider  acetaminophen (TYLENOL) 500 MG tablet Take 1,000 mg by mouth every 6 (six) hours as needed for mild pain.    Historical Provider, MD  albuterol (PROVENTIL HFA;VENTOLIN HFA) 108 (90 BASE) MCG/ACT inhaler Inhale 2 puffs into the lungs every 4 (four) hours as needed for wheezing or shortness of breath. 02/22/15   Lysbeth Penner, FNP  benzonatate (TESSALON) 100 MG capsule Take 2 capsules (200 mg total) by mouth 3 (three) times daily as needed for cough. 06/17/15   Ejiroghene Arlyce Dice, MD  busPIRone (BUSPAR) 10 MG tablet Take 10 mg by mouth 2 (two) times daily. 06/25/15   Historical Provider, MD  esomeprazole (NEXIUM) 40 MG capsule Take 1 capsule (40 mg total) by mouth 2 (two) times daily before a meal. 07/08/15   Mahala Menghini, PA-C  furosemide (LASIX) 20 MG tablet Take 1 tablet (20 mg total) by mouth daily. 05/11/15   Francesca Oman, DO  gabapentin (NEURONTIN) 100 MG capsule Take 100 mg by mouth 2 (two) times daily. 07/28/15   Historical Provider, MD  guaiFENesin-codeine 100-10 MG/5ML syrup Take 10 mLs by mouth every 6 (six) hours as needed for cough. 07/30/15   Forde Dandy, MD  ibuprofen (ADVIL,MOTRIN) 200 MG tablet Take 400 mg by mouth every 6 (six) hours as needed for mild pain.    Historical Provider, MD  Insulin Glargine (LANTUS SOLOSTAR) 100 UNIT/ML Solostar Pen INJECT 16 UNITS SUBCUTANEOUSLY AT BEDTIME. 05/13/15   Francesca Oman, DO  lisinopril (PRINIVIL,ZESTRIL) 40 MG tablet Take 1 tablet (40 mg total) by mouth daily. 05/11/15   Francesca Oman, DO  loratadine (CLARITIN) 10 MG tablet Take 1 tablet (10 mg total) by mouth daily. 04/21/15   Francesca Oman, DO  lovastatin (MEVACOR) 20 MG  tablet TAKE (1) TABLET BY MOUTH AT BEDTIME. 09/28/15   Francesca Oman, DO  metFORMIN (GLUCOPHAGE) 1000 MG tablet TAKE (1) TABLET BY MOUTH TWICE DAILY WITH MEALS. 11/01/14   Francesca Oman, DO  metoprolol (LOPRESSOR) 100 MG tablet Take 1 tablet (100 mg total) by mouth 2 (two) times daily. 05/11/15   Francesca Oman, DO  mometasone (NASONEX) 50 MCG/ACT nasal spray Place 2 sprays into the nose daily. 05/13/15   Francesca Oman, DO  Multiple Vitamins-Minerals (MULTIVITAMIN WITH MINERALS) tablet Take 1 tablet by mouth daily.    Historical Provider, MD  nitroGLYCERIN (NITROSTAT) 0.3 MG SL tablet Place 1 tablet (0.3 mg total) under the tongue every 5 (five) minutes as needed for chest pain. 12/09/14   Lendon Colonel, NP  ondansetron (ZOFRAN) 4 MG tablet Take 1 tablet (4 mg  total) by mouth every 8 (eight) hours as needed for nausea or vomiting. 04/16/15   Daleen Bo, MD  ondansetron (ZOFRAN) 4 MG tablet Take 1 tablet (4 mg total) by mouth every 6 (six) hours. 10/07/15   Virgel Manifold, MD  potassium chloride SA (K-DUR,KLOR-CON) 20 MEQ tablet Take 1 tablet (20 mEq total) by mouth daily. 05/11/15   Francesca Oman, DO  traMADol (ULTRAM) 50 MG tablet Take 1 tablet (50 mg total) by mouth every 6 (six) hours as needed. 10/07/15   Virgel Manifold, MD  ziprasidone (GEODON) 80 MG capsule Take 160 mg by mouth daily.    Historical Provider, MD   BP 135/68 mmHg  Pulse 84  Temp(Src) 99.3 F (37.4 C)  Resp 16  Ht 5\' 4"  (1.626 m)  Wt 184 lb (83.462 kg)  BMI 31.57 kg/m2  SpO2 100%  LMP 05/09/2015 Physical Exam  Constitutional: She appears well-developed and well-nourished. No distress.  HENT:  Head: Normocephalic and atraumatic.  Eyes: Conjunctivae are normal. Right eye exhibits no discharge. Left eye exhibits no discharge.  Neck: Neck supple.  Cardiovascular: Normal rate, regular rhythm and normal heart sounds.  Exam reveals no gallop and no friction rub.   No murmur heard. Pulmonary/Chest: Effort normal and breath  sounds normal. No respiratory distress.  Abdominal: Soft. She exhibits no distension. There is no tenderness.  Musculoskeletal: She exhibits no edema or tenderness.  Neurological: She is alert.  Skin: Skin is warm and dry.  Psychiatric: She has a normal mood and affect. Her behavior is normal. Thought content normal.  Nursing note and vitals reviewed.   ED Course  Procedures (including critical care time) Labs Review Labs Reviewed  COMPREHENSIVE METABOLIC PANEL - Abnormal; Notable for the following:    ALT 12 (*)    All other components within normal limits  CBC - Abnormal; Notable for the following:    Hemoglobin 11.7 (*)    HCT 35.9 (*)    All other components within normal limits  CBG MONITORING, ED - Abnormal; Notable for the following:    Glucose-Capillary 103 (*)    All other components within normal limits  LIPASE, BLOOD    Imaging Review No results found. I have personally reviewed and evaluated these images and lab results as part of my medical decision-making.   EKG Interpretation None      MDM   Final diagnoses:  Abdominal pain, unspecified abdominal location    50 year old female with diarrhea and crampy abdominal pain. Possible viral illness? Her abdominal exam is benign. Afebrile. Hemodynamically stable. Basic labs fairly unremarkable. Treated symptomatically with improvement.It has been determined that no acute conditions requiring further emergency intervention are present at this time. The patient has been advised of the diagnosis and plan. I reviewed any labs and imaging including any potential incidental findings. We have discussed signs and symptoms that warrant return to the ED and they are listed in the discharge instructions.      Virgel Manifold, MD 10/13/15 1157

## 2015-10-26 ENCOUNTER — Ambulatory Visit (INDEPENDENT_AMBULATORY_CARE_PROVIDER_SITE_OTHER): Payer: Medicare Other | Admitting: Internal Medicine

## 2015-10-26 ENCOUNTER — Encounter: Payer: Self-pay | Admitting: Internal Medicine

## 2015-10-26 VITALS — BP 129/67 | HR 77 | Temp 98.3°F | Ht 64.5 in | Wt 182.3 lb

## 2015-10-26 DIAGNOSIS — E119 Type 2 diabetes mellitus without complications: Secondary | ICD-10-CM | POA: Diagnosis present

## 2015-10-26 DIAGNOSIS — Z79899 Other long term (current) drug therapy: Secondary | ICD-10-CM

## 2015-10-26 DIAGNOSIS — Z794 Long term (current) use of insulin: Secondary | ICD-10-CM

## 2015-10-26 DIAGNOSIS — E785 Hyperlipidemia, unspecified: Secondary | ICD-10-CM

## 2015-10-26 DIAGNOSIS — F17210 Nicotine dependence, cigarettes, uncomplicated: Secondary | ICD-10-CM | POA: Diagnosis not present

## 2015-10-26 DIAGNOSIS — F17200 Nicotine dependence, unspecified, uncomplicated: Secondary | ICD-10-CM

## 2015-10-26 DIAGNOSIS — D509 Iron deficiency anemia, unspecified: Secondary | ICD-10-CM | POA: Diagnosis not present

## 2015-10-26 DIAGNOSIS — J302 Other seasonal allergic rhinitis: Secondary | ICD-10-CM | POA: Diagnosis not present

## 2015-10-26 DIAGNOSIS — I10 Essential (primary) hypertension: Secondary | ICD-10-CM

## 2015-10-26 LAB — POCT GLYCOSYLATED HEMOGLOBIN (HGB A1C): HEMOGLOBIN A1C: 6.6

## 2015-10-26 LAB — HM DIABETES EYE EXAM

## 2015-10-26 LAB — GLUCOSE, CAPILLARY: GLUCOSE-CAPILLARY: 102 mg/dL — AB (ref 65–99)

## 2015-10-26 MED ORDER — LISINOPRIL 40 MG PO TABS: 40.0000 mg | ORAL_TABLET | Freq: Every day | ORAL | Status: DC

## 2015-10-26 MED ORDER — MOMETASONE FUROATE 50 MCG/ACT NA SUSP: 2.0000 | Freq: Every day | NASAL | Status: DC

## 2015-10-26 MED ORDER — LORATADINE 10 MG PO TABS: 10.0000 mg | ORAL_TABLET | Freq: Every day | ORAL | Status: DC

## 2015-10-26 MED ORDER — METOPROLOL TARTRATE 100 MG PO TABS: 100.0000 mg | ORAL_TABLET | Freq: Two times a day (BID) | ORAL | Status: DC

## 2015-10-26 MED ORDER — POTASSIUM CHLORIDE CRYS ER 20 MEQ PO TBCR: 20.0000 meq | EXTENDED_RELEASE_TABLET | Freq: Every day | ORAL | Status: DC

## 2015-10-26 MED ORDER — NITROGLYCERIN 0.3 MG SL SUBL: 0.3000 mg | SUBLINGUAL_TABLET | SUBLINGUAL | Status: DC | PRN

## 2015-10-26 MED ORDER — INSULIN GLARGINE 100 UNIT/ML SOLOSTAR PEN: PEN_INJECTOR | SUBCUTANEOUS | Status: DC

## 2015-10-26 MED ORDER — METFORMIN HCL 1000 MG PO TABS
ORAL_TABLET | ORAL | Status: DC
Start: 2015-10-26 — End: 2016-09-06

## 2015-10-26 MED ORDER — ATORVASTATIN CALCIUM 40 MG PO TABS: 40.0000 mg | ORAL_TABLET | Freq: Every day | ORAL | Status: DC

## 2015-10-26 NOTE — Patient Instructions (Signed)
Keep up the good work in loosing weight. Continue to attempt to quite smoking as this will be the best thing for your health.  We will increase your statin medication that your are taking for cholesterol. The new medication is called atorvastin 40mg  and you should take it every day.  I will call you with any abnormal results on your blood work today. If your labs are normal, then I will plan to follow up with you in 6 months.

## 2015-10-26 NOTE — Assessment & Plan Note (Signed)
Controlled. Refilled Mometasone.

## 2015-10-26 NOTE — Assessment & Plan Note (Addendum)
Reports good compliance with meds, but notes that she does not check BG unless she feels bad. Home BG readings are good ~80-100. POC A1c 6.6. Continue current regimen. Foot exam today wnl. Eye retinacam photos taken today. F/u 6 months

## 2015-10-26 NOTE — Assessment & Plan Note (Signed)
Counseled on smoking cessation. Pt agreeable. Currently ~5 cigarrettes a day. Reports difficulty being "mental barriers" but know that she can quit because she has had success in the past. Will continue to encourage w/ supportive counseling for now. Not interested in pharmacotherapy at this time.

## 2015-10-26 NOTE — Assessment & Plan Note (Signed)
Reports recent fatigue. Hbg borderline low in the ED ~2 wks ago. H/o IDA w/ poor iron supplementation compliance. Will encourage supplementation and recheck CBC today.

## 2015-10-26 NOTE — Assessment & Plan Note (Signed)
Stable continue current meds 

## 2015-10-26 NOTE — Assessment & Plan Note (Signed)
In spite of "normal" lipid panel, ASCVD 10 yr risk High d/t comorbidity. Currently 25%. If pt quits smoking, will half risk to 13%. Would recommend high intensity statin therapy with atorvastatin 40mg  qD. Tolerated stating therapy well in the past.

## 2015-10-26 NOTE — Progress Notes (Signed)
CC: f/u DM HPI: [name] is a 50 y.o. female with a h/o of DM, HTN, smoking who presents for f/u of DM.  See problem based charting for HPI.   Past Medical History  Diagnosis Date  . Arteriosclerotic cardiovascular disease (ASCVD)     Minimal at cath in [facility]stress nuclear study in 8/08 with nl EF; neg stress echo in 2010  . Diabetes mellitus, type 2 ([place]) 2000    Onset in 2000; no insulin  . Hyperlipidemia   . Hypertension `    during treatment with Geodon  . Gastroesophageal reflux disease     Schatzki's ring  . Anemia, iron deficiency   . Alcohol abuse   . Depression   . Community acquired pneumonia 01/03/10, 05/2010, 04/2012    2011; with pleural effusion-[facility] acute resp failure; intubated in Jan 2014 (HMPV pneumonia)  . Obesity   . Schizoaffective disorder     requiring multiple psychiatric admissions  . Dysphagia   . Diastolic dysfunction     grade 2 per echo 2011  . Pulmonary hypertension ([place]) 05/02/2012    Patient needs repeat echo in 06/2012   . History of alcohol abuse 07/22/2007    Qualifier: Diagnosis of  By: Lenn Cal    . PTSD (post-traumatic stress disorder)    Current Outpatient Rx  Name  Route  Sig  Dispense  Refill  . acetaminophen (TYLENOL) 500 MG tablet   Oral   Take 1,000 mg by mouth every 6 (six) hours as needed for mild pain.         Marland Kitchen albuterol (PROVENTIL HFA;VENTOLIN HFA) 108 (90 BASE) MCG/ACT inhaler   Inhalation   Inhale 2 puffs into the lungs every 4 (four) hours as needed for wheezing or shortness of breath.   1 Inhaler   0   . atorvastatin (LIPITOR) 40 MG tablet   Oral   Take 1 tablet (40 mg total) by mouth daily.   30 tablet   11   . benzonatate (TESSALON) 100 MG capsule   Oral   Take 2 capsules (200 mg total) by mouth 3 (three) times daily as needed for cough.   21 capsule   0   . busPIRone (BUSPAR) 10 MG tablet   Oral   Take 10 mg by mouth 2 (two) times daily.         Marland Kitchen  esomeprazole (NEXIUM) 40 MG capsule      TAKE ONE CAPSULE BY MOUTH TWICE DAILY BEFORE A MEAL.   60 capsule   0   . furosemide (LASIX) 20 MG tablet   Oral   Take 1 tablet (20 mg total) by mouth daily.   90 tablet   1   . gabapentin (NEURONTIN) 100 MG capsule   Oral   Take 100 mg by mouth 2 (two) times daily.         Marland Kitchen guaiFENesin-codeine 100-10 MG/5ML syrup   Oral   Take 10 mLs by mouth every 6 (six) hours as needed for cough.   120 mL   0   . ibuprofen (ADVIL,MOTRIN) 200 MG tablet   Oral   Take 400 mg by mouth every 6 (six) hours as needed for mild pain.         . Insulin Glargine (LANTUS SOLOSTAR) 100 UNIT/ML Solostar Pen      INJECT 16 UNITS SUBCUTANEOUSLY AT BEDTIME.   15 mL   5   . lisinopril (PRINIVIL,ZESTRIL) 40 MG tablet  Oral   Take 1 tablet (40 mg total) by mouth daily.   90 tablet   1   . loratadine (CLARITIN) 10 MG tablet   Oral   Take 1 tablet (10 mg total) by mouth daily.   90 tablet   1   . metFORMIN (GLUCOPHAGE) 1000 MG tablet      TAKE (1) TABLET BY MOUTH TWICE DAILY WITH MEALS.   60 tablet   11   . metoprolol (LOPRESSOR) 100 MG tablet   Oral   Take 1 tablet (100 mg total) by mouth 2 (two) times daily.   180 tablet   1   . mometasone (NASONEX) 50 MCG/ACT nasal spray   Nasal   Place 2 sprays into the nose daily.   17 g   3   . Multiple Vitamins-Minerals (MULTIVITAMIN WITH MINERALS) tablet   Oral   Take 1 tablet by mouth daily.         . nitroGLYCERIN (NITROSTAT) 0.3 MG SL tablet   Sublingual   Place 1 tablet (0.3 mg total) under the tongue every 5 (five) minutes as needed for chest pain.   25 tablet   3   . ondansetron (ZOFRAN) 4 MG tablet   Oral   Take 1 tablet (4 mg total) by mouth every 8 (eight) hours as needed for nausea or vomiting.   4 tablet   0   . ondansetron (ZOFRAN) 4 MG tablet   Oral   Take 1 tablet (4 mg total) by mouth every 6 (six) hours.   12 tablet   0   . potassium chloride SA (K-DUR,KLOR-CON)  20 MEQ tablet   Oral   Take 1 tablet (20 mEq total) by mouth daily.   90 tablet   1   . traMADol (ULTRAM) 50 MG tablet   Oral   Take 1 tablet (50 mg total) by mouth every 6 (six) hours as needed.   15 tablet   0   . ziprasidone (GEODON) 80 MG capsule   Oral   Take 160 mg by mouth daily.          Review of Systems: A complete ROS was negative except as per HPI.  Physical Exam: Filed Vitals:   10/26/15 1430 10/26/15 1432  BP:  129/67  Pulse:  77  Temp: 98.3 F (36.8 C)   TempSrc: Oral   Height: 5' 4.5" (1.638 m)   Weight: 182 lb 4.8 oz (82.691 kg)   SpO2:  100%   General appearance: alert, cooperative and appears stated age Head: Normocephalic, without obvious abnormality, atraumatic Lungs: clear to auscultation bilaterally Heart: regular rate and rhythm, S1, S2 normal, no murmur, click, rub or gallop Abdomen: soft, non-tender; bowel sounds normal; no masses,  no organomegaly Extremities: extremities normal, atraumatic, no cyanosis or edema  Assessment & Plan:  See encounters tab for problem based medical decision making. Patient seen with Dr. Daryll Drown  Signed: Holley Raring, MD 10/26/2015, 4:53 PM  Pager: [phone]

## 2015-10-27 LAB — BMP8+ANION GAP
Anion Gap: 15 mmol/L (ref 10.0–18.0)
BUN / CREAT RATIO: 11 (ref 9–23)
BUN: 11 mg/dL (ref 6–24)
CHLORIDE: 100 mmol/L (ref 96–106)
CO2: 23 mmol/L (ref 18–29)
Calcium: 9.6 mg/dL (ref 8.7–10.2)
Creatinine, Ser: 0.96 mg/dL (ref 0.57–1.00)
GFR calc non Af Amer: 71 mL/min/{1.73_m2} (ref >59)
GFR, EST AFRICAN AMERICAN: 81 mL/min/{1.73_m2} (ref >59)
GLUCOSE: 98 mg/dL (ref 65–99)
Potassium: 4.1 mmol/L (ref 3.5–5.2)
SODIUM: 138 mmol/L (ref 134–144)

## 2015-10-27 LAB — CBC
Hematocrit: 36.3 % (ref 34.0–46.6)
Hemoglobin: 11.6 g/dL (ref 11.1–15.9)
MCH: 26.8 pg (ref 26.6–33.0)
MCHC: 32 g/dL (ref 31.5–35.7)
MCV: 84 fL (ref 79–97)
Platelets: 245 x10E3/uL (ref 150–379)
RBC: 4.33 x10E6/uL (ref 3.77–5.28)
RDW: 14.9 % (ref 12.3–15.4)
WBC: 6.7 x10E3/uL (ref 3.4–10.8)

## 2015-10-28 NOTE — Addendum Note (Signed)
Addended by: Gilles Chiquito B on: 10/28/2015 03:34 PM   Modules accepted: Level of Service

## 2015-10-28 NOTE — Progress Notes (Signed)
Internal Medicine Clinic Attending  I saw and evaluated the patient.  I personally confirmed the key portions of the history and exam documented by Dr. Strelow and I reviewed pertinent patient test results.  The assessment, diagnosis, and plan were formulated together and I agree with the documentation in the resident's note. 

## 2015-10-31 ENCOUNTER — Other Ambulatory Visit: Payer: Self-pay | Admitting: Dietician

## 2015-10-31 DIAGNOSIS — F319 Bipolar disorder, unspecified: Secondary | ICD-10-CM | POA: Diagnosis not present

## 2015-10-31 DIAGNOSIS — Z794 Long term (current) use of insulin: Principal | ICD-10-CM

## 2015-10-31 DIAGNOSIS — E119 Type 2 diabetes mellitus without complications: Secondary | ICD-10-CM

## 2015-10-31 NOTE — Telephone Encounter (Signed)
She started study yesterday- (at [facility]- gave her the study flyer here on 10/26/15), blood pressure is good, wants to know blood sugar and her meter is not working.  Asked her to check the date on her strips- they are dated 01/2015- she requests a prescription for checking blood sugar 3 times a day sent to [facility] in [place].   She also would like to know if her blood pressure is good without any medicine if she has to take her blood pressure medicine. She says she always takes her Pm blood pressure medicine, but sometimes misses her am blood pressure medicine, at least until later in the day and she still gets good readings. For example, she didn't take blood pressure medicine this am , it was today 108/67 at another doctor appointment. She wants to know what blood pressure is too low and what should she do if her blood pressure gets that low?    Advised her to speak to Dr.Strelow about this and that she should take what medicine is prescribed until then.

## 2015-11-01 ENCOUNTER — Telehealth: Payer: Self-pay | Admitting: Internal Medicine

## 2015-11-01 ENCOUNTER — Other Ambulatory Visit: Payer: Self-pay | Admitting: Dietician

## 2015-11-01 DIAGNOSIS — Z794 Long term (current) use of insulin: Principal | ICD-10-CM

## 2015-11-01 DIAGNOSIS — E119 Type 2 diabetes mellitus without complications: Secondary | ICD-10-CM

## 2015-11-01 MED ORDER — ACCU-CHEK FASTCLIX LANCETS MISC: Status: DC

## 2015-11-01 MED ORDER — GLUCOSE BLOOD VI STRP: ORAL_STRIP | Status: DC

## 2015-11-01 NOTE — Addendum Note (Signed)
Addended by: Resa Miner on: 11/01/2015 04:52 PM   Modules accepted: Orders

## 2015-11-01 NOTE — Telephone Encounter (Signed)
  Reason for call:   I placed an outgoing call to [name] at 10:00 AM regarding her blood pressure medications.   Assessment/ Plan:   Called [name] to discuss her question regarding her BP meds and low BP readings. She did not answer and no VM was left.  Will plan to advise pt that she should continue to take her medications as prescribed, Metoprolol BID and Lisinopril qD for her BP. If she should notice that she feels lightheaded/dizzy when she gets up from sitting, or notices that her BP is <100/60, please call our clinic for further evaluation. She should remember to drink plenty of fluids to stay hydrated. We have not noted any low BPs in our clinic and she has not endorsed any symptoms of hypotension or orthostasis on prior visits.  Would consider lowering Lisinopril from 40mg  qD to 20mg  qD. Would continue Metoprolol for cardio-protection in the setting of CHF.  As always, pt is advised that if symptoms arise, they should go to an urgent care facility or to to ER for further evaluation.   Holley Raring, MD   11/01/2015, 9:59 AM

## 2015-11-01 NOTE — Telephone Encounter (Signed)
Notified patient that her refill was sent and Dr. Jamse Arn response to her question about what blood pressure is too low. She acknowledged appreciation for the information.

## 2015-11-21 ENCOUNTER — Ambulatory Visit: Payer: Medicare Other | Admitting: Gastroenterology

## 2015-11-21 ENCOUNTER — Encounter: Payer: Self-pay | Admitting: Dietician

## 2015-11-22 ENCOUNTER — Encounter: Payer: Self-pay | Admitting: Nurse Practitioner

## 2015-11-22 ENCOUNTER — Ambulatory Visit (INDEPENDENT_AMBULATORY_CARE_PROVIDER_SITE_OTHER): Payer: Medicare Other | Admitting: Nurse Practitioner

## 2015-11-22 VITALS — BP 137/74 | HR 68 | Temp 98.0°F | Ht 64.0 in | Wt 180.2 lb

## 2015-11-22 DIAGNOSIS — R1310 Dysphagia, unspecified: Secondary | ICD-10-CM | POA: Diagnosis not present

## 2015-11-22 DIAGNOSIS — K219 Gastro-esophageal reflux disease without esophagitis: Secondary | ICD-10-CM

## 2015-11-22 NOTE — Assessment & Plan Note (Signed)
Overall her GERD symptoms seem to be well controlled on Nexium 40 mg twice a day. Did have breakthrough symptoms this past weekend from eating fried fish with too much pepper. Advised her to continue taking Nexium, avoid trigger foods. We will provide further education with her discharge instructions. Return for follow-up in 6 months or sooner if needed.

## 2015-11-22 NOTE — Assessment & Plan Note (Signed)
Since been seem to be resolved, likely uncontrolled GERD. Continue to monitor. Return for follow-up in 6 months or sooner if needed.

## 2015-11-22 NOTE — Progress Notes (Signed)
Referring Provider: Holley Raring, MD Primary Care Physician:  Noah Delaine, MD Primary GI:  Dr. Gala Romney  Chief Complaint  Patient presents with  . Follow-up    Doing better til recent episode with stomach    HPI:   [name] is a 50 y.o. female who presents for postprocedure follow-up. Last seen in our office 08/02/2015 which at that time she noted deliberate 35 pound weight loss, decrease in Nexium to once daily with some breakthrough typical GERD symptoms. Also complaining of solid food dysphagia with a history of Schatzki's ring status post dilation 2009 and 2013. Previous colonoscopy 2013. Her Nexium was increased back to 40 mg twice daily, offered upper endoscopy with possible dilation.   EGD completed 08/18/2015 which found normal esophagus status post dilation, normal stomach, normal second portion of the duodenum. Recommended advance diet, continue current medications including Nexium 40 mg twice daily, return for follow-up in 3 months.  On 10/07/2015 she presented to the emergency department complaining of one week of diarrhea with 3-4 watery stools a day, mild crampy abdominal pain that was intermittent, nausea. Abdominal exam was benign, afebrile, hemodynamically stable, basic labs fairly unremarkable. Deemed likely viral illness and treated symptomatically. She was discharged with prescriptions of Zofran and Ultram.  Today she states she was doing well until this last weekend when she had fish with too much pepper which upset her stomach including nausea and abdominal pain. Has been taking Nexium which has been working well until this past weekend. Symptoms are calming down. Yesterday was a good. Dysphagia improved. Denies hematochezia, melena, abdominal pain, N/V (except this past weekend.) Denies chest pain, dyspnea, dizziness, lightheadedness, syncope, near syncope. Denies any other upper or lower GI symptoms.  Past Medical History:  Diagnosis Date  . Alcohol abuse    . Anemia, iron deficiency   . Arteriosclerotic cardiovascular disease (ASCVD)    Minimal at cath in [facility].stress nuclear study in 8/08 with nl EF; neg stress echo in 2010  . Community acquired pneumonia 01/03/10, 05/2010, 04/2012   2011; with pleural effusion-[facility] acute resp failure; intubated in Jan 2014 (HMPV pneumonia)  . Depression   . Diabetes mellitus, type 2 ([place]) 2000   Onset in 2000; no insulin  . Diastolic dysfunction    grade 2 per echo 2011  . Dysphagia   . Gastroesophageal reflux disease    Schatzki's ring  . History of alcohol abuse 07/22/2007   Qualifier: Diagnosis of  By: Lenn Cal    . Hyperlipidemia   . Hypertension `   during treatment with Geodon  . Obesity   . PTSD (post-traumatic stress disorder)   . Pulmonary hypertension ([place]) 05/02/2012   Patient needs repeat echo in 06/2012   . Schizoaffective disorder    requiring multiple psychiatric admissions    Past Surgical History:  Procedure Laterality Date  . COLONOSCOPY  01/2006   internal hemorrhoids  . COLONOSCOPY  01/10/2012   Dr. Rourk:Single anal canal hemorrhoidal tag likely source of  trivial hematochezia; right-sided colonic diverticulosis  . DILATION AND CURETTAGE, DIAGNOSTIC / THERAPEUTIC  1992  . ESOPHAGEAL DILATION N/A 08/18/2015   Procedure: ESOPHAGEAL DILATION;  Surgeon: Daneil Dolin, MD;  Location: [facility];  Service: Endoscopy;  Laterality: N/A;  . ESOPHAGOGASTRODUODENOSCOPY  09/16/08   Dr. Trevor Iha hiatal hernia/excoriations involving the cardia and mucosa consistent with trauma, antral erosions  of linear petechiae ? gastritis versus early gastric antral vascular  ectasia.Marland Kitchen biopsy showed reactive gastropathy. No  H. pylori.  . ESOPHAGOGASTRODUODENOSCOPY  09/2007   Dr. Evalee Mutton ring, dilated to 35 French Maloney dilator, small hiatal hernia, antral erosions, biopsies reactive gastropathy.  . ESOPHAGOGASTRODUODENOSCOPY (EGD) WITH PROPOFOL N/A 12/17/2013    [id number] antral erosions and petechiae. Small hiatal hernia. No endoscopic explanation for patient's symptoms  . ESOPHAGOGASTRODUODENOSCOPY (EGD) WITH PROPOFOL N/A 08/18/2015   Procedure: ESOPHAGOGASTRODUODENOSCOPY (EGD) WITH PROPOFOL;  Surgeon: Daneil Dolin, MD;  Location: [facility];  Service: Endoscopy;  Laterality: N/A;  0845  . SAVORY DILATION  07/17/2011   Fields-[facility] sedation-->distal esophageal stricture s/p dilation, chronic gastritis, multiple ulcers in stomach. no h.pylori    Current Outpatient Prescriptions  Medication Sig Dispense Refill  . acetaminophen (TYLENOL) 500 MG tablet Take 1,000 mg by mouth every 6 (six) hours as needed for mild pain.    Marland Kitchen albuterol (PROVENTIL HFA;VENTOLIN HFA) 108 (90 BASE) MCG/ACT inhaler Inhale 2 puffs into the lungs every 4 (four) hours as needed for wheezing or shortness of breath. 1 Inhaler 0  . atorvastatin (LIPITOR) 40 MG tablet Take 1 tablet (40 mg total) by mouth daily. 30 tablet 11  . benzonatate (TESSALON) 100 MG capsule Take 2 capsules (200 mg total) by mouth 3 (three) times daily as needed for cough. 21 capsule 0  . esomeprazole (NEXIUM) 40 MG capsule TAKE ONE CAPSULE BY MOUTH TWICE DAILY BEFORE A MEAL. 60 capsule 0  . furosemide (LASIX) 20 MG tablet Take 1 tablet (20 mg total) by mouth daily. 90 tablet 1  . guaiFENesin-codeine 100-10 MG/5ML syrup Take 10 mLs by mouth every 6 (six) hours as needed for cough. 120 mL 0  . ibuprofen (ADVIL,MOTRIN) 200 MG tablet Take 400 mg by mouth every 6 (six) hours as needed for mild pain.    . Insulin Glargine (LANTUS SOLOSTAR) 100 UNIT/ML Solostar Pen INJECT 16 UNITS SUBCUTANEOUSLY AT BEDTIME. 15 mL 5  . lisinopril (PRINIVIL,ZESTRIL) 40 MG tablet Take 1 tablet (40 mg total) by mouth daily. 90 tablet 1  . loratadine (CLARITIN) 10 MG tablet Take 1 tablet (10 mg total) by mouth daily. 90 tablet 1  . metFORMIN (GLUCOPHAGE) 1000 MG tablet TAKE (1) TABLET BY MOUTH TWICE DAILY WITH MEALS. 60 tablet 11  .  metoprolol (LOPRESSOR) 100 MG tablet Take 1 tablet (100 mg total) by mouth 2 (two) times daily. 180 tablet 1  . mometasone (NASONEX) 50 MCG/ACT nasal spray Place 2 sprays into the nose daily. 17 g 3  . ondansetron (ZOFRAN) 4 MG tablet Take 1 tablet (4 mg total) by mouth every 8 (eight) hours as needed for nausea or vomiting. 4 tablet 0  . ondansetron (ZOFRAN) 4 MG tablet Take 1 tablet (4 mg total) by mouth every 6 (six) hours. 12 tablet 0  . potassium chloride SA (K-DUR,KLOR-CON) 20 MEQ tablet Take 1 tablet (20 mEq total) by mouth daily. 90 tablet 1  . traMADol (ULTRAM) 50 MG tablet Take 1 tablet (50 mg total) by mouth every 6 (six) hours as needed. 15 tablet 0  . ziprasidone (GEODON) 80 MG capsule Take 160 mg by mouth daily.    Marland Kitchen ACCU-CHEK FASTCLIX LANCETS MISC Check blood sugar three times a day 102 each 5  . glucose blood (ACCU-CHEK SMARTVIEW) test strip Check blood sugar three times a day 100 each 5  . Multiple Vitamins-Minerals (MULTIVITAMIN WITH MINERALS) tablet Take 1 tablet by mouth daily.     No current facility-administered medications for this visit.    Facility-Administered Medications Ordered in Other Visits  Medication Dose  Route Frequency Provider Last Rate Last Dose  . 0.9 %  sodium chloride infusion   Intravenous Once Merryl Hacker, MD      . pantoprazole (PROTONIX) injection 40 mg  40 mg Intravenous Once Merryl Hacker, MD        Allergies as of 11/22/2015 - Review Complete 11/22/2015  Allergen Reaction Noted  . Cephalexin  10/14/2013  . Metronidazole Shortness Of Breath and Swelling   . Orange Itching 03/27/2011  . Shrimp [shellfish allergy] Shortness Of Breath and Itching 03/27/2011  . Penicillins Hives and Swelling   . Sulfonamide derivatives Hives   . Glipizide Other (See Comments) 01/15/2011  . Sulfamethoxazole-trimethoprim Rash 02/27/2010    Family History  Problem Relation Age of Onset  . Hypertension Mother   . Stroke Father     deceased at age 84    . Colon cancer Other   . Heart disease Sister   . Diabetes    . High Cholesterol    . Arthritis    . Anesthesia problems Neg Hx   . Hypotension Neg Hx   . Malignant hyperthermia Neg Hx   . Pseudochol deficiency Neg Hx     Social History   Social History  . Marital status: Single    Spouse name: N/A  . Number of children: 2  . Years of education: some colge   Occupational History  . Disability   . UNEMPLOYED Unemployed   Social History Main Topics  . Smoking status: Current Every Day Smoker    Packs/day: 0.40    Years: 30.00    Types: Cigarettes    Start date: 05/18/2012  . Smokeless tobacco: Never Used  . Alcohol use No     Comment: hx of ETOH abuse  . Drug use: No  . Sexual activity: No   Other Topics Concern  . 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 ENT: Negative for hoarseness, difficulty swallowing. CV: Negative for chest pain, angina, palpitations, peripheral edema.  Respiratory: Negative for dyspnea at rest, cough, sputum, wheezing.  GI: See history of present illness. Endo: Negative for unusual weight change.  Heme: Negative for bruising or bleeding. Allergy: Negative for rash or hives.   Physical Exam: BP 137/74   Pulse 68   Temp 98 F (36.7 C) (Oral)   Ht 5\' 4"  (1.626 m)   Wt 180 lb 3.2 oz (81.7 kg)   LMP 10/29/2015 (Approximate)   BMI 30.93 kg/m  General:   Alert and oriented. Pleasant and cooperative. Well-nourished and well-developed.  Ears:  Normal auditory acuity. Cardiovascular:  S1, S2 present without murmurs appreciated. Extremities without clubbing or edema. Respiratory:   Clear to auscultation bilaterally. No wheezes, rales, or rhonchi. No distress.  Gastrointestinal:  +BS, rounded but soft, non-tender and non-distended. No HSM noted. No guarding or rebound. No masses appreciated.  Rectal:  Deferred  Musculoskalatal:  Symmetrical without gross deformities. Skin:  Intact without significant lesions or rashes. Neurologic:  Alert and oriented x4;  grossly normal neurologically. Psych:  Alert and cooperative. Normal mood and affect. Heme/Lymph/Immune: No excessive bruising noted.    11/22/2015 11:16 AM  Disclaimer: This note was dictated with voice recognition software. Similar sounding words can inadvertently be transcribed and may not be corrected upon review.

## 2015-11-22 NOTE — Progress Notes (Signed)
CC'ED TO PCP 

## 2015-11-22 NOTE — Patient Instructions (Addendum)
1. Continue taking Nexium twice a day. 2. Avoid trigger foods that make her symptoms worse. 3. Return for follow-up in 6 months to see how you do long-term on the twice a day Nexium. 4. If you have worsening or recurrent symptoms/problems, you can call us and we can see you sooner. 5. I'm attaching further information on typical foods to avoid with acid reflux.   Food Choices for Gastroesophageal Reflux Disease, Adult When you have gastroesophageal reflux disease (GERD), the foods you eat and your eating habits are very important. Choosing the right foods can help ease the discomfort of GERD. WHAT GENERAL GUIDELINES DO I NEED TO FOLLOW?  Choose fruits, vegetables, whole grains, low-fat dairy products, and low-fat meat, fish, and poultry.  Limit fats such as oils, salad dressings, butter, nuts, and avocado.  Keep a food diary to identify foods that cause symptoms.  Avoid foods that cause reflux. These may be different for different people.  Eat frequent small meals instead of three large meals each day.  Eat your meals slowly, in a relaxed setting.  Limit fried foods.  Cook foods using methods other than frying.  Avoid drinking alcohol.  Avoid drinking large amounts of liquids with your meals.  Avoid bending over or lying down until 2-3 hours after eating. WHAT FOODS ARE NOT RECOMMENDED? The following are some foods and drinks that may worsen your symptoms: Vegetables Tomatoes. Tomato juice. Tomato and spaghetti sauce. Chili peppers. Onion and garlic. Horseradish. Fruits Oranges, grapefruit, and lemon (fruit and juice). Meats High-fat meats, fish, and poultry. This includes hot dogs, ribs, ham, sausage, salami, and bacon. Dairy Whole milk and chocolate milk. Sour cream. Cream. Butter. Ice cream. Cream cheese.  Beverages Coffee and tea, with or without caffeine. Carbonated beverages or energy drinks. Condiments Hot sauce. Barbecue sauce.  Sweets/Desserts Chocolate and  cocoa. Donuts. Peppermint and spearmint. Fats and Oils High-fat foods, including [place] fries and potato chips. Other Vinegar. Strong spices, such as black pepper, white pepper, red pepper, cayenne, curry powder, cloves, ginger, and chili powder. The items listed above may not be a complete list of foods and beverages to avoid. Contact your dietitian for more information.   This information is not intended to replace advice given to you by your health care provider. Make sure you discuss any questions you have with your health care provider.   Document Released: 04/02/2005 Document Revised: 04/23/2014 Document Reviewed: 02/04/2013 Elsevier Interactive Patient Education [employer].

## 2015-11-23 ENCOUNTER — Telehealth: Payer: Self-pay | Admitting: *Deleted

## 2015-11-23 NOTE — Telephone Encounter (Signed)
"  Can you tell the doctor that I'm not taking the medication that he wrote me for my cholesterol?" "I went back to the lovastatin." Patient not taking atorvastatin because "I start feeling really bad I have tried it once before." Patient complains of severe fatigue and "feel sick in my body". She tried it again this time, but said that she experienced the same symptoms.

## 2015-11-25 ENCOUNTER — Other Ambulatory Visit: Payer: Self-pay | Admitting: Internal Medicine

## 2015-12-05 ENCOUNTER — Other Ambulatory Visit: Payer: Self-pay | Admitting: Internal Medicine

## 2015-12-12 ENCOUNTER — Telehealth: Payer: Self-pay | Admitting: *Deleted

## 2015-12-12 DIAGNOSIS — E119 Type 2 diabetes mellitus without complications: Secondary | ICD-10-CM

## 2015-12-12 DIAGNOSIS — Z794 Long term (current) use of insulin: Principal | ICD-10-CM

## 2015-12-12 MED ORDER — GLUCOSE BLOOD VI STRP: ORAL_STRIP | 5 refills | Status: DC

## 2015-12-12 MED ORDER — ACCU-CHEK FASTCLIX LANCETS MISC: 5 refills | Status: DC

## 2015-12-12 NOTE — Telephone Encounter (Signed)
Received call from pt's pharmacy regarding rx for test strips and lancets written by Dr Gay Filler.  They are unable to process rx, because MD is not set up for [facility].  New rx sent electronically to pharmacy (unable to take verbal order over the phone) with the attending MD authorizing. Will send to MD for cosign.Regenia Skeeter, Yahia Bottger Cassady8/28/201710:09 AM

## 2015-12-12 NOTE — Telephone Encounter (Signed)
Will forward to [facility] to see how he cannot Rx to [facility]

## 2015-12-14 ENCOUNTER — Other Ambulatory Visit: Payer: Self-pay | Admitting: Nurse Practitioner

## 2015-12-23 ENCOUNTER — Telehealth: Payer: Self-pay | Admitting: Internal Medicine

## 2015-12-23 DIAGNOSIS — E113299 Type 2 diabetes mellitus with mild nonproliferative diabetic retinopathy without macular edema, unspecified eye: Secondary | ICD-10-CM

## 2015-12-23 MED ORDER — LOVASTATIN 40 MG PO TABS: 40.0000 mg | ORAL_TABLET | Freq: Every day | ORAL | 11 refills | Status: DC

## 2015-12-23 NOTE — Telephone Encounter (Signed)
  Reason for call:   I placed an outgoing call to [name] at 9:30 AM regarding her diabetic retinopathy screening.   Assessment/ Plan:   The patient answered the phone.  I relayed the results of her retina photo screening which demonstrated mild NPDR changes in her OD. I recommended evaluation by Ophthalmology and she was agreeable to this. Referral made.  Pt also reported that she was unable to take atorvastatin 2/2 sideeffects of body aches and fatigue. She has switched back to the left-over lovastatin 20mg  which she was previously taking. I have told her this is acceptable and I will refill a new Rx for Lovastatin, but will prescribe a higher dose in order to maximize the benefit. Will plan to re-check lipids at her next appt. Rx for Lovastatin 40mg  qD #30 R11 sent to [facility].  As always, pt is advised that if symptoms worsen or new symptoms arise, they should go to an urgent care facility or to to ER for further evaluation.   Holley Raring, MD   12/23/2015, 9:34 AM

## 2016-01-18 ENCOUNTER — Other Ambulatory Visit: Payer: Self-pay | Admitting: Nurse Practitioner

## 2016-02-20 ENCOUNTER — Other Ambulatory Visit: Payer: Self-pay | Admitting: Internal Medicine

## 2016-02-20 DIAGNOSIS — I10 Essential (primary) hypertension: Secondary | ICD-10-CM

## 2016-02-23 DIAGNOSIS — F319 Bipolar disorder, unspecified: Secondary | ICD-10-CM | POA: Diagnosis not present

## 2016-02-29 ENCOUNTER — Encounter: Payer: Self-pay | Admitting: Internal Medicine

## 2016-03-05 ENCOUNTER — Other Ambulatory Visit: Payer: Self-pay | Admitting: Internal Medicine

## 2016-03-24 ENCOUNTER — Other Ambulatory Visit: Payer: Self-pay | Admitting: Internal Medicine

## 2016-03-27 ENCOUNTER — Other Ambulatory Visit: Payer: Self-pay | Admitting: *Deleted

## 2016-03-27 MED ORDER — FUROSEMIDE 20 MG PO TABS: 20.0000 mg | ORAL_TABLET | Freq: Every day | ORAL | 3 refills | Status: DC

## 2016-04-02 ENCOUNTER — Ambulatory Visit (INDEPENDENT_AMBULATORY_CARE_PROVIDER_SITE_OTHER): Payer: Medicare Other | Admitting: Internal Medicine

## 2016-04-02 VITALS — BP 133/70 | HR 90 | Temp 98.3°F | Ht 64.0 in | Wt 198.3 lb

## 2016-04-02 DIAGNOSIS — Z794 Long term (current) use of insulin: Secondary | ICD-10-CM

## 2016-04-02 DIAGNOSIS — F17210 Nicotine dependence, cigarettes, uncomplicated: Secondary | ICD-10-CM

## 2016-04-02 DIAGNOSIS — E119 Type 2 diabetes mellitus without complications: Secondary | ICD-10-CM

## 2016-04-02 DIAGNOSIS — H2513 Age-related nuclear cataract, bilateral: Secondary | ICD-10-CM | POA: Diagnosis not present

## 2016-04-02 DIAGNOSIS — N920 Excessive and frequent menstruation with regular cycle: Secondary | ICD-10-CM | POA: Diagnosis not present

## 2016-04-02 DIAGNOSIS — D509 Iron deficiency anemia, unspecified: Secondary | ICD-10-CM | POA: Diagnosis not present

## 2016-04-02 DIAGNOSIS — Z23 Encounter for immunization: Secondary | ICD-10-CM | POA: Diagnosis not present

## 2016-04-02 DIAGNOSIS — E113299 Type 2 diabetes mellitus with mild nonproliferative diabetic retinopathy without macular edema, unspecified eye: Secondary | ICD-10-CM

## 2016-04-02 DIAGNOSIS — E113291 Type 2 diabetes mellitus with mild nonproliferative diabetic retinopathy without macular edema, right eye: Secondary | ICD-10-CM | POA: Diagnosis not present

## 2016-04-02 DIAGNOSIS — R6889 Other general symptoms and signs: Secondary | ICD-10-CM | POA: Diagnosis not present

## 2016-04-02 DIAGNOSIS — F17200 Nicotine dependence, unspecified, uncomplicated: Secondary | ICD-10-CM

## 2016-04-02 DIAGNOSIS — R739 Hyperglycemia, unspecified: Secondary | ICD-10-CM | POA: Insufficient documentation

## 2016-04-02 DIAGNOSIS — R5383 Other fatigue: Secondary | ICD-10-CM | POA: Diagnosis not present

## 2016-04-02 DIAGNOSIS — Z0000 Encounter for general adult medical examination without abnormal findings: Secondary | ICD-10-CM

## 2016-04-02 LAB — GLUCOSE, CAPILLARY: Glucose-Capillary: 242 mg/dL — ABNORMAL HIGH (ref 65–99)

## 2016-04-02 LAB — POCT GLYCOSYLATED HEMOGLOBIN (HGB A1C): Hemoglobin A1C: 7.7

## 2016-04-02 NOTE — Progress Notes (Signed)
Information (Labs) requested by [facility] was faxed to [phone] per patient request.  Sander Nephew, RN 04/02/2016 4:43 PM

## 2016-04-02 NOTE — Assessment & Plan Note (Signed)
Reports 1-2 weeks of persistent fatigue and cold intolerance, reports having "chills all the time." Has a history of IDA and DUB and reports persistent monthly menstruation lasting 4-5 days requiring 6-7 pads daily, denies melena or hematochezia. Reports weight gain and dry skin recently. No insomnia, depression, anxiety lately. No pertinent findings on exam, no overt pallor.  Assessment: fatigue and cold intolerance concerning for hypothyroidism vs recurrence of iron deficiency anemia  Plan: - Check CBC, ferritin, TSH today - follow lab results - Follow up symptoms in 1 month

## 2016-04-02 NOTE — Assessment & Plan Note (Signed)
Provided flu vaccine 

## 2016-04-02 NOTE — Assessment & Plan Note (Signed)
HbA1c today 7.7, loss of glycemic control from 6.6 in July, denies any recent polyuria/polydipsia. Did not bring glucometer today. Takes Metformin 1000 BID and Lantus 16u QHS. Reports that she sometimes likes to fast for half a day and will take a half-dose of her Lantus the night before. Inquired about fasting for a full day as well.  Plan: - Continue Metformin 1000 mg BID and Lantus 16 units QHS, check glucometer and A1c in 3 months

## 2016-04-02 NOTE — Progress Notes (Signed)
   CC: Cold intolerance, fatigue, weight gain  HPI:  [name] is a 50 y.o. female with PMHx detailed below presenting with complaints of 1 week of cold intolerance, fatigue, and recent unexplained weight gain over the past few months. She has had iron deficiency anemia in the past due to dysfunctional uterine bleeding and is presenting today due to concern for recurrence of this condition.  See problem based assessment and plan below for additional details.  Past Medical History:  Diagnosis Date  . Alcohol abuse   . Anemia, iron deficiency   . Arteriosclerotic cardiovascular disease (ASCVD)    Minimal at cath in [facility].stress nuclear study in 8/08 with nl EF; neg stress echo in 2010  . Community acquired pneumonia 01/03/10, 05/2010, 04/2012   2011; with pleural effusion-[facility] acute resp failure; intubated in Jan 2014 (HMPV pneumonia)  . Depression   . Diabetes mellitus, type 2 ([place]) 2000   Onset in 2000; no insulin  . Diastolic dysfunction    grade 2 per echo 2011  . Dysphagia   . Gastroesophageal reflux disease    Schatzki's ring  . History of alcohol abuse 07/22/2007   Qualifier: Diagnosis of  By: Lenn Cal    . Hyperlipidemia   . Hypertension `   during treatment with Geodon  . Obesity   . PTSD (post-traumatic stress disorder)   . Pulmonary hypertension 05/02/2012   Patient needs repeat echo in 06/2012   . Schizoaffective disorder    requiring multiple psychiatric admissions    Review of Systems: Review of Systems  Constitutional: Positive for chills. Negative for diaphoresis, fever and weight loss.  Respiratory: Positive for cough (dry). Negative for sputum production and shortness of breath.   Cardiovascular: Negative for chest pain and palpitations.  Gastrointestinal: Positive for nausea. Negative for abdominal pain, blood in stool, constipation, diarrhea, melena and vomiting.  Genitourinary: Negative for hematuria.    Psychiatric/Behavioral: Negative for depression. The patient is not nervous/anxious and does not have insomnia.   All other systems reviewed and are negative.    Physical Exam: Vitals:   04/02/16 1504  BP: 133/70  Pulse: 90  Temp: 98.3 F (36.8 C)  TempSrc: Oral  SpO2: 100%  Weight: 198 lb 4.8 oz (89.9 kg)  Height: 5\' 4"  (1.626 m)   Body mass index is 34.04 kg/m. GENERAL- Obese woman sitting comfortably in exam room chair, alert, in no distress HEENT- Atraumatic, PERRL, moist mucous membranes CARDIAC- Regular rate and rhythm, no murmurs, rubs or gallops. RESP- Clear to ascultation bilaterally, no wheezing or crackles, normal work of breathing ABDOMEN- Soft, nontender, nondistended EXTREMITIES- Normal bulk and range of motion, no edema, 2+ peripheral pulses SKIN- Warm, dry, intact, without visible rash PSYCH- Normal  affect, clear speech, thoughts linear and goal-directed  Assessment & Plan:   See encounters tab for problem based medical decision making.  Patient seen with Dr. Dareen Piano

## 2016-04-02 NOTE — Patient Instructions (Signed)
We have checked your blood counts and thyroid function today and will call if the results are abnormal.  Please continue to take your medications as prescribed.  Thank you for visiting today, please return in 1 month!

## 2016-04-02 NOTE — Assessment & Plan Note (Signed)
Reports smoking nearly a pack per day. Interested in quitting but knows she needs to be mentally-prepared for her next attempt. Reports using prayer/faith in the past and was able to quit for over a year. Has used NRT in the past with limited success. Chantix not ideal given her pysch history and questionable diagnosis of PTSD in past but Bupropion would be a great option.  Discussed tobacco cessation strategies, says she is planning to quit again soon and she will return for clinic visit. Interested in Bupropion to help with tobacco cessation in future.

## 2016-04-03 LAB — CBC
HEMATOCRIT: 33.6 % — AB (ref 34.0–46.6)
HEMOGLOBIN: 10.2 g/dL — AB (ref 11.1–15.9)
MCH: 24.2 pg — ABNORMAL LOW (ref 26.6–33.0)
MCHC: 30.4 g/dL — AB (ref 31.5–35.7)
MCV: 80 fL (ref 79–97)
Platelets: 272 10*3/uL (ref 150–379)
RBC: 4.21 x10E6/uL (ref 3.77–5.28)
RDW: 17.6 % — ABNORMAL HIGH (ref 12.3–15.4)
WBC: 7.1 10*3/uL (ref 3.4–10.8)

## 2016-04-03 LAB — FERRITIN: Ferritin: 11 ng/mL — ABNORMAL LOW (ref 15–150)

## 2016-04-03 LAB — TSH: TSH: 0.875 u[IU]/mL (ref 0.450–4.500)

## 2016-04-03 MED ORDER — FERROUS SULFATE 325 (65 FE) MG PO TABS: 325.0000 mg | ORAL_TABLET | Freq: Every day | ORAL | 3 refills | Status: DC

## 2016-04-03 NOTE — Assessment & Plan Note (Signed)
Addendum: Hgb 10.2 with borderline microcytic MCV 80, Ferritin low at 11  Plan: - Ferrous sulfate 325 mg

## 2016-04-03 NOTE — Addendum Note (Signed)
Addended by: Cordie Grice on: 04/03/2016 07:58 AM   Modules accepted: Orders

## 2016-04-03 NOTE — Progress Notes (Signed)
Internal Medicine Clinic Attending  I saw and evaluated the patient.  I personally confirmed the key portions of the history and exam documented by Dr. Johnson and I reviewed pertinent patient test results.  The assessment, diagnosis, and plan were formulated together and I agree with the documentation in the resident's note.  

## 2016-04-04 ENCOUNTER — Telehealth: Payer: Self-pay

## 2016-04-04 ENCOUNTER — Telehealth: Payer: Self-pay | Admitting: Internal Medicine

## 2016-04-04 NOTE — Telephone Encounter (Signed)
Patient requesting her lab results from 04/02/2016

## 2016-04-04 NOTE — Telephone Encounter (Signed)
Requesting lab result. Please call pt back.  

## 2016-04-11 NOTE — Telephone Encounter (Signed)
Called and relayed lab results to Ms. Hauver over the phone on 04/06/16

## 2016-04-18 ENCOUNTER — Encounter: Payer: Self-pay | Admitting: Internal Medicine

## 2016-04-21 ENCOUNTER — Other Ambulatory Visit: Payer: Self-pay | Admitting: Internal Medicine

## 2016-05-04 ENCOUNTER — Telehealth: Payer: Self-pay | Admitting: *Deleted

## 2016-05-04 ENCOUNTER — Other Ambulatory Visit: Payer: Self-pay | Admitting: *Deleted

## 2016-05-04 DIAGNOSIS — B373 Candidiasis of vulva and vagina: Secondary | ICD-10-CM

## 2016-05-04 DIAGNOSIS — B3731 Acute candidiasis of vulva and vagina: Secondary | ICD-10-CM

## 2016-05-04 MED ORDER — FLUCONAZOLE 150 MG PO TABS: 150.0000 mg | ORAL_TABLET | Freq: Once | ORAL | 3 refills | Status: AC

## 2016-05-04 NOTE — Telephone Encounter (Signed)
Diflucan 150mg  1 tablet, R3 called in to [facility]. Thanks for clarification.

## 2016-05-04 NOTE — Telephone Encounter (Signed)
Pt states she knows she has yeast, has used OTC treatment with little improvement, states no sexual activity in at least 2 years, she states she has taken diflucan before when same issues happened with excellent results, transportation issues and nothing open til next week appt wise. Could you please review or call her yourself and revisit diflucan?

## 2016-05-07 ENCOUNTER — Emergency Department (HOSPITAL_COMMUNITY): Payer: Medicare Other

## 2016-05-07 ENCOUNTER — Encounter (HOSPITAL_COMMUNITY): Payer: Self-pay

## 2016-05-07 DIAGNOSIS — Z79899 Other long term (current) drug therapy: Secondary | ICD-10-CM | POA: Diagnosis not present

## 2016-05-07 DIAGNOSIS — R0789 Other chest pain: Secondary | ICD-10-CM | POA: Diagnosis not present

## 2016-05-07 DIAGNOSIS — F17210 Nicotine dependence, cigarettes, uncomplicated: Secondary | ICD-10-CM | POA: Diagnosis not present

## 2016-05-07 DIAGNOSIS — Z794 Long term (current) use of insulin: Secondary | ICD-10-CM | POA: Diagnosis not present

## 2016-05-07 DIAGNOSIS — I110 Hypertensive heart disease with heart failure: Secondary | ICD-10-CM | POA: Insufficient documentation

## 2016-05-07 DIAGNOSIS — E119 Type 2 diabetes mellitus without complications: Secondary | ICD-10-CM | POA: Insufficient documentation

## 2016-05-07 DIAGNOSIS — R5383 Other fatigue: Secondary | ICD-10-CM | POA: Insufficient documentation

## 2016-05-07 DIAGNOSIS — R05 Cough: Secondary | ICD-10-CM | POA: Diagnosis not present

## 2016-05-07 DIAGNOSIS — I5030 Unspecified diastolic (congestive) heart failure: Secondary | ICD-10-CM | POA: Diagnosis not present

## 2016-05-07 DIAGNOSIS — R0602 Shortness of breath: Secondary | ICD-10-CM | POA: Insufficient documentation

## 2016-05-07 NOTE — ED Triage Notes (Signed)
I have been coughing so hard and so bad that I think I may have broken a rib.  I have used Tylenol and a heating pad and nothing helps.  I have been vomiting some, having chills.  Took tylenol around 1 hour ago.

## 2016-05-08 ENCOUNTER — Emergency Department (HOSPITAL_COMMUNITY)
Admission: EM | Admit: 2016-05-08 | Discharge: 2016-05-08 | Disposition: A | Discharge status: Home / Self Care | Payer: Medicare Other | Attending: Emergency Medicine | Admitting: Emergency Medicine

## 2016-05-08 DIAGNOSIS — R05 Cough: Secondary | ICD-10-CM

## 2016-05-08 DIAGNOSIS — R059 Cough, unspecified: Secondary | ICD-10-CM

## 2016-05-08 LAB — BASIC METABOLIC PANEL
Anion gap: 8 (ref 5–15)
BUN: 8 mg/dL (ref 6–20)
CHLORIDE: 102 mmol/L (ref 101–111)
CO2: 26 mmol/L (ref 22–32)
Calcium: 9.1 mg/dL (ref 8.9–10.3)
Creatinine, Ser: 0.94 mg/dL (ref 0.44–1.00)
GFR calc Af Amer: >60 mL/min (ref >60)
GFR calc non Af Amer: >60 mL/min (ref >60)
GLUCOSE: 221 mg/dL — AB (ref 65–99)
Potassium: 3.6 mmol/L (ref 3.5–5.1)
Sodium: 136 mmol/L (ref 135–145)

## 2016-05-08 LAB — D-DIMER, QUANTITATIVE: D-Dimer, Quant: 0.28 ug/mL-FEU (ref 0.00–0.50)

## 2016-05-08 MED ORDER — IBUPROFEN 400 MG PO TABS
400.0000 mg | ORAL_TABLET | Freq: Once | ORAL | Status: AC
  Administered 2016-05-08: 400 mg via ORAL
  Filled 2016-05-08: qty 1

## 2016-05-08 MED ORDER — IPRATROPIUM-ALBUTEROL 0.5-2.5 (3) MG/3ML IN SOLN
3.0000 mL | Freq: Once | RESPIRATORY_TRACT | Status: AC
  Administered 2016-05-08: 3 mL via RESPIRATORY_TRACT
  Filled 2016-05-08: qty 3

## 2016-05-08 MED ORDER — NAPROXEN 500 MG PO TABS: 500.0000 mg | ORAL_TABLET | Freq: Two times a day (BID) | ORAL | 0 refills | Status: DC

## 2016-05-08 MED ORDER — BENZONATATE 100 MG PO CAPS: 100.0000 mg | ORAL_CAPSULE | Freq: Three times a day (TID) | ORAL | 0 refills | Status: DC

## 2016-05-08 MED ORDER — DOXYCYCLINE HYCLATE 100 MG PO CAPS: 100.0000 mg | ORAL_CAPSULE | Freq: Two times a day (BID) | ORAL | 0 refills | Status: DC

## 2016-05-08 NOTE — ED Provider Notes (Signed)
[facility] DEPT Provider Note   CSN: [id number] Arrival date & time: 05/07/16  2138     History   Chief Complaint Chief Complaint  Patient presents with  . Influenza    HPI [name] is a 51 y.o. female.  Patient states she's had a cough for 2 months which she attributed to her smoking. She did never been diagnosed with asthma or COPD. She reports over the past 3 days her cough is More severe and she has severe rib pain and thinks that she may have cracked a rib from coughing. She's also had several episodes of posttussive emesis. She's had chills but no documented fever. He complains of right lower rib pain that is worse with deep breathing and coughing. Denies any chest pain. Has a history of high blood pressure, diabetes on insulin, anemia, diastolic heart failure. Denies any leg pain or leg swelling. She's been using albuterol intermittently at home. She has also has some congestion, runny nose and sore throat.   The history is provided by the patient.  Influenza  Presenting symptoms: cough, fatigue, rhinorrhea and shortness of breath   Presenting symptoms: no fever, no headaches, no myalgias, no nausea and no vomiting   Associated symptoms: nasal congestion     Past Medical History:  Diagnosis Date  . Alcohol abuse   . Anemia, iron deficiency   . Arteriosclerotic cardiovascular disease (ASCVD)    Minimal at cath in [facility].stress nuclear study in 8/08 with nl EF; neg stress echo in 2010  . Community acquired pneumonia 01/03/10, 05/2010, 04/2012   2011; with pleural effusion-[facility] acute resp failure; intubated in Jan 2014 (HMPV pneumonia)  . Depression   . Diabetes mellitus, type 2 ([place]) 2000   Onset in 2000; no insulin  . Diastolic dysfunction    grade 2 per echo 2011  . Dysphagia   . Gastroesophageal reflux disease    Schatzki's ring  . History of alcohol abuse 07/22/2007   Qualifier: Diagnosis of  By: Lenn Cal    . Hyperlipidemia     . Hypertension `   during treatment with Geodon  . Obesity   . PTSD (post-traumatic stress disorder)   . Pulmonary hypertension 05/02/2012   Patient needs repeat echo in 06/2012   . Schizoaffective disorder    requiring multiple psychiatric admissions    Patient Active Problem List   Diagnosis Date Noted  . Hyperglycemia 04/02/2016  . Dysphagia   . Diabetes mellitus type II, controlled ([place]) 12/19/2014  . Burning sensation 12/15/2014  . Chest pain 12/15/2014  . Palpitations 12/15/2014  . Encounter for health education 10/24/2014  . Fatigue 10/04/2014  . Left knee pain 08/25/2014  . Vision changes 08/12/2014  . Pulsatile tinnitus 08/12/2014  . IIH (idiopathic intracranial hypertension) 08/12/2014  . Nausea with vomiting 07/26/2014  . Seasonal allergies 02/23/2014  . Flank pain, acute 01/01/2014  . Hypokalemia 12/25/2013  . Headache 12/07/2013  . Dyspepsia 11/19/2013  . Leg swelling 10/21/2013  . Diarrhea 10/14/2013  . Daytime hypersomnolence 12/09/2012  . COPD (chronic obstructive pulmonary disease) ([place]) 10/20/2012  . GERD (gastroesophageal reflux disease) 10/15/2012  . Preventative health care 10/15/2012  . Pulmonary hypertension 05/02/2012  . Diastolic heart failure ([place]) 02/07/2012  . IDA (iron deficiency anemia) 11/06/2011  . Arteriosclerotic cardiovascular disease (ASCVD)   . TOBACCO ABUSE 09/27/2009  . Hyperlipidemia 07/22/2007  . Obesity 07/22/2007  . Bipolar 1 disorder ([place]) 07/22/2007  . Essential hypertension 07/22/2007  . Diabetes mellitus type  2, controlled ([place]) 09/28/2002    Past Surgical History:  Procedure Laterality Date  . COLONOSCOPY  01/2006   internal hemorrhoids  . COLONOSCOPY  01/10/2012   Dr. Rourk:Single anal canal hemorrhoidal tag likely source of  trivial hematochezia; right-sided colonic diverticulosis  . DILATION AND CURETTAGE, DIAGNOSTIC / THERAPEUTIC  1992  . ESOPHAGEAL DILATION N/A 08/18/2015   Procedure: ESOPHAGEAL DILATION;   Surgeon: Daneil Dolin, MD;  Location: [facility] ENDO SUITE;  Service: Endoscopy;  Laterality: N/A;  . ESOPHAGOGASTRODUODENOSCOPY  09/16/08   Dr. Trevor Iha hiatal hernia/excoriations involving the cardia and mucosa consistent with trauma, antral erosions  of linear petechiae ? gastritis versus early gastric antral vascular  ectasia.Marland Kitchen biopsy showed reactive gastropathy. No H. pylori.  . ESOPHAGOGASTRODUODENOSCOPY  09/2007   Dr. Evalee Mutton ring, dilated to 36 French Maloney dilator, small hiatal hernia, antral erosions, biopsies reactive gastropathy.  . ESOPHAGOGASTRODUODENOSCOPY (EGD) WITH PROPOFOL N/A 12/17/2013   IRJ:JOACZYS antral erosions and petechiae. Small hiatal hernia. No endoscopic explanation for patient's symptoms  . ESOPHAGOGASTRODUODENOSCOPY (EGD) WITH PROPOFOL N/A 08/18/2015   Procedure: ESOPHAGOGASTRODUODENOSCOPY (EGD) WITH PROPOFOL;  Surgeon: Daneil Dolin, MD;  Location: [facility] END[facility] SU[facility];  Service: Endoscopy;  Laterality: N/A;  0845  . SAVORY DILATION  07/17/2011   [facility] sedation-->distal esophageal stricture s/p dilation, chronic gastritis, multiple ulcers in stomach. no h.pylori    OB History    Gravida Para Term Preterm AB Living   _0 SAB TAB Ectopic Multiple Live Births     1     2       Home Medications    Prior to Admission medications   Medication Sig Start Date End Date Taking? Authorizing Provider  ACCU-CHEK FASTCLIX LANCETS MISC Check blood sugar three times a day 12/12/15   Bartholomew Crews, MD  acetaminophen (TYLENOL) 500 MG tablet Take 1,000 mg by mouth every 6 (six) hours as needed for mild pain.    Historical Provider, MD  albuterol (PROVENTIL HFA;VENTOLIN HFA) 108 (90 BASE) MCG/ACT inhaler Inhale 2 puffs into the lungs every 4 (four) hours as needed for wheezing or shortness of breath. 02/22/15   Lysbeth Penner, FNP  benzonatate (TESSALON) 100 MG capsule Take 2 capsules (200 mg total) by mouth 3 (three) times daily as needed for cough. 06/17/15    Ejiroghene Arlyce Dice, MD  esomeprazole (NEXIUM) 40 MG capsule TAKE ONE CAPSULE BY MOUTH TWICE DAILY BEFORE A MEAL. 01/18/16   Mahala Menghini, PA-C  ferrous sulfate 325 (65 FE) MG tablet Take 1 tablet (325 mg total) by mouth daily. 04/03/16 04/03/17  Asencion Partridge, MD  furosemide (LASIX) 20 MG tablet Take 1 tablet (20 mg total) by mouth daily. 03/27/16   Holley Raring, MD  glucose blood (ACCU-CHEK SMARTVIEW) test strip Check blood sugar three times a day 12/12/15   Bartholomew Crews, MD  guaiFENesin-codeine 100-10 MG/5ML syrup Take 10 mLs by mouth every 6 (six) hours as needed for cough. 07/30/15   Forde Dandy, MD  ibuprofen (ADVIL,MOTRIN) 200 MG tablet Take 400 mg by mouth every 6 (six) hours as needed for mild pain.    Historical Provider, MD  Insulin Glargine (LANTUS SOLOSTAR) 100 UNIT/ML Solostar Pen INJECT 16 UNITS SUBCUTANEOUSLY AT BEDTIME. 10/26/15   Holley Raring, MD  lisinopril (PRINIVIL,ZESTRIL) 40 MG tablet TAKE 1 TABLET BY MOUTH ONCE DAILY. 02/22/16   Axel Filler, MD  loratadine (CLARITIN) 10 MG tablet TAKE 1 TABLET BY MOUTH AT BEDTIME. 03/06/16  Holley Raring, MD  lovastatin (MEVACOR) 40 MG tablet Take 1 tablet (40 mg total) by mouth daily. 12/23/15 12/22/16  Holley Raring, MD  metFORMIN (GLUCOPHAGE) 1000 MG tablet TAKE (1) TABLET BY MOUTH TWICE DAILY WITH MEALS. 10/26/15   Holley Raring, MD  metoprolol (LOPRESSOR) 100 MG tablet Take 1 tablet (100 mg total) by mouth 2 (two) times daily. 10/26/15   Holley Raring, MD  mometasone (NASONEX) 50 MCG/ACT nasal spray Place 2 sprays into the nose daily. 10/26/15   Holley Raring, MD  Multiple Vitamins-Minerals (MULTIVITAMIN WITH MINERALS) tablet Take 1 tablet by mouth daily.    Historical Provider, MD  NOVOFINE 32G X 6 MM MISC USE TO INJECT INSULIN ONCE DAILY. 03/06/16   Holley Raring, MD  ondansetron (ZOFRAN) 4 MG tablet Take 1 tablet (4 mg total) by mouth every 8 (eight) hours as needed for nausea or vomiting. 04/16/15   Daleen Bo, MD    ondansetron (ZOFRAN) 4 MG tablet Take 1 tablet (4 mg total) by mouth every 6 (six) hours. 10/07/15   Virgel Manifold, MD  potassium chloride SA (K-DUR,KLOR-CON) 20 MEQ tablet TAKE 1 TABLET BY MOUTH DAILY. 04/23/16   Holley Raring, MD  traMADol (ULTRAM) 50 MG tablet Take 1 tablet (50 mg total) by mouth every 6 (six) hours as needed. 10/07/15   Virgel Manifold, MD  ziprasidone (GEODON) 80 MG capsule Take 160 mg by mouth daily.    Historical Provider, MD    Family History Family History  Problem Relation Age of Onset  . Hypertension Mother   . Stroke Father     deceased at age 85  . Colon cancer Other   . Heart disease Sister   . Diabetes    . High Cholesterol    . Arthritis    . Anesthesia problems Neg Hx   . Hypotension Neg Hx   . Malignant hyperthermia Neg Hx   . Pseudochol deficiency Neg Hx     Social History Social History  Substance Use Topics  . Smoking status: Current Every Day Smoker    Packs/day: 0.40    Years: 30.00    Types: Cigarettes    Start date: 05/18/2012  . Smokeless tobacco: Never Used     Comment: 10-15 per day  . Alcohol use No     Comment: hx of ETOH abuse     Allergies   Cephalexin; Metronidazole; Orange; Shrimp [shellfish allergy]; Penicillins; Sulfonamide derivatives; Glipizide; and Sulfamethoxazole-trimethoprim   Review of Systems Review of Systems  Constitutional: Positive for activity change, appetite change and fatigue. Negative for fever.  HENT: Positive for congestion and rhinorrhea.   Respiratory: Positive for cough, chest tightness and shortness of breath.   Cardiovascular: Negative for chest pain.  Gastrointestinal: Negative for abdominal pain, nausea and vomiting.  Genitourinary: Negative for vaginal bleeding and vaginal discharge.  Musculoskeletal: Negative for arthralgias and myalgias.  Neurological: Negative for dizziness, weakness and headaches.   A complete 10 system review of systems was obtained and all systems are negative except  as noted in the HPI and PMH.    Physical Exam Updated Vital Signs BP 160/88   Pulse 95   Temp 99.7 F (37.6 C) (Oral)   Resp 17   Ht _0  (1.626 m)   Wt 184 lb (83.5 kg)   LMP 05/02/2016 (Exact Date)   SpO2 98%   BMI 31.58 kg/m   Physical Exam  Constitutional: She is oriented to person, place, and time. She appears well-developed and well-nourished. No distress.  Speaking in full sentences  HENT:  Head: Normocephalic and atraumatic.  Mouth/Throat: Oropharynx is clear and moist. No oropharyngeal exudate.  Eyes: Conjunctivae and EOM are normal. Pupils are equal, round, and reactive to light.  Neck: Normal range of motion. Neck supple.  No meningismus.  Cardiovascular: Normal rate, regular rhythm, normal heart sounds and intact distal pulses.   No murmur heard. Pulmonary/Chest: Effort normal and breath sounds normal. No respiratory distress. She has no wheezes. She exhibits tenderness.  R lower rib tenderness Diminished breath sounds bilaterally  Abdominal: Soft. There is no tenderness. There is no rebound and no guarding.  Musculoskeletal: Normal range of motion. She exhibits no edema or tenderness.  Neurological: She is alert and oriented to person, place, and time. No cranial nerve deficit. She exhibits normal muscle tone. Coordination normal.  No ataxia on finger to nose bilaterally. No pronator drift. 5/5 strength throughout. CN 2-12 intact.Equal grip strength. Sensation intact.   Skin: Skin is warm. Capillary refill takes less than 2 seconds.  Psychiatric: She has a normal mood and affect. Her behavior is normal.  Nursing note and vitals reviewed.    ED Treatments / Results  Labs (all labs ordered are listed, but only abnormal results are displayed) Labs Reviewed  BASIC METABOLIC PANEL - Abnormal; Notable for the following:       Result Value   Glucose, Bld 221 (*)    All other components within normal limits  D-DIMER, QUANTITATIVE (NOT AT [facility])    EKG  EKG  Interpretation  Date/Time:  Tuesday May 08 2016 02:30:37 EST Ventricular Rate:  81 PR Interval:    QRS Duration: 83 QT Interval:  395 QTC Calculation: 459 R Axis:   73 Text Interpretation:  Sinus rhythm Borderline short PR interval RAE, consider biatrial enlargement Anteroseptal infarct, old Minimal ST depression, inferior leads Minimal ST elevation, lateral leads No significant change was found Confirmed by Wyvonnia Dusky  MD, Kafi Dotter [id number]) on 05/08/2016 2:34:31 AM       Radiology Dg Chest 2 View  Result Date: 05/07/2016 CLINICAL DATA:  Coughing congestion for 3-4 weeks. EXAM: CHEST  2 VIEW COMPARISON:  07/30/2015 FINDINGS: The heart size and mediastinal contours are within normal limits. Both lungs are clear. The visualized skeletal structures are unremarkable. IMPRESSION: No active cardiopulmonary disease. Electronically Signed   By: Misty Stanley M.D.   On: 05/07/2016 22:30    Procedures Procedures (including critical care time)  Medications Ordered in ED Medications  ibuprofen (ADVIL,MOTRIN) tablet 400 mg (not administered)  ipratropium-albuterol (DUONEB) 0.5-2.5 (3) MG/3ML nebulizer solution 3 mL (3 mLs Nebulization Given 05/08/16 0230)     Initial Impression / Assessment and Plan / ED Course  I have reviewed the triage vital signs and the nursing notes.  Pertinent labs & imaging results that were available during my care of the patient were reviewed by me and considered in my medical decision making (see chart for details).     Acute on chronic cough in a smoker. Rib pain from coughing. Pain is somewhat pleuritic and worse with deep breathing. Diminished breath sounds with no wheezing. Lungs are clear on x-ray.  Patient also has history of acid reflux and is on lisinopril for hypertension which may be contributing to her cough.  D-dimer negative. Doubt ACS, doubt PE. Patient breath sounds improved after nebulizer. We'll not provide steroids given her history of diabetes  and she is not wheezing.  Treatment for  bronchitis. Discussed smoking cessation. Also discuss possibly stopping lisinopril with  PCP. Return precautions discussed. No hypoxia.   Final Clinical Impressions(s) / ED Diagnoses   Final diagnoses:  Cough    New Prescriptions New Prescriptions   No medications on file     Ezequiel Essex, MD 05/08/16 [phone]

## 2016-05-08 NOTE — Discharge Instructions (Signed)
Do your best to stop smoking. Take the antibiotics as prescribed. Ask your doctor about stopping lisinopril to help with the cough. Return to the ED if you develop chest pain, shortness of breath, or any other concerns.

## 2016-05-09 ENCOUNTER — Ambulatory Visit: Payer: Medicare Other

## 2016-05-10 ENCOUNTER — Ambulatory Visit: Payer: Medicare Other

## 2016-05-10 ENCOUNTER — Ambulatory Visit (INDEPENDENT_AMBULATORY_CARE_PROVIDER_SITE_OTHER): Payer: Medicare Other | Admitting: Internal Medicine

## 2016-05-10 VITALS — BP 159/88 | HR 98 | Temp 98.4°F | Ht 64.0 in | Wt 193.9 lb

## 2016-05-10 DIAGNOSIS — R05 Cough: Secondary | ICD-10-CM | POA: Diagnosis not present

## 2016-05-10 DIAGNOSIS — F17210 Nicotine dependence, cigarettes, uncomplicated: Secondary | ICD-10-CM | POA: Diagnosis not present

## 2016-05-10 DIAGNOSIS — Z79899 Other long term (current) drug therapy: Secondary | ICD-10-CM

## 2016-05-10 DIAGNOSIS — I10 Essential (primary) hypertension: Secondary | ICD-10-CM

## 2016-05-10 DIAGNOSIS — R053 Chronic cough: Secondary | ICD-10-CM

## 2016-05-10 MED ORDER — LOSARTAN POTASSIUM 50 MG PO TABS: 50.0000 mg | ORAL_TABLET | Freq: Every day | ORAL | 0 refills | Status: DC

## 2016-05-10 NOTE — Assessment & Plan Note (Signed)
BP Readings from Last 3 Encounters:  05/10/16 (!) 159/88  05/08/16 148/87  04/02/16 133/70    Lab Results  Component Value Date   NA 136 05/08/2016   K 3.6 05/08/2016   CREATININE 0.94 05/08/2016   Patient currently prescribed lisinopril 40 mg daily, lopressor 100 mg daily, lasix 20 mg daily. BP well controlled at previous visits. Reports non-productive cough for the past 2 months. Was seen in the ED on 1/22. Told to follow up with her PCP for possible discontinuation of lisionpril.  She reports she has not taken the lisinopril for the past 3 days. Cough is improving but not resolved.   BP today is uncontrolled.  Assessment: Worsening HTN  Plan: D/C lisinopril Start Losartan 50 mg daily Continue atenolol and lasix RTC in 2-4 weeks for follow up

## 2016-05-10 NOTE — Progress Notes (Signed)
   CC: Cough  HPI:  [name] is a 51 y.o. female with a past medical history listed below here today with complaints of cough.  For details of today's visit and the status of her chronic medical issues please refer to the assessment and plan.   Past Medical History:  Diagnosis Date  . Alcohol abuse   . Anemia, iron deficiency   . Arteriosclerotic cardiovascular disease (ASCVD)    Minimal at cath in [facility].stress nuclear study in 8/08 with nl EF; neg stress echo in 2010  . Community acquired pneumonia 01/03/10, 05/2010, 04/2012   2011; with pleural effusion-[facility] acute resp failure; intubated in Jan 2014 (HMPV pneumonia)  . Depression   . Diabetes mellitus, type 2 ([place]) 2000   Onset in 2000; no insulin  . Diastolic dysfunction    grade 2 per echo 2011  . Dysphagia   . Gastroesophageal reflux disease    Schatzki's ring  . History of alcohol abuse 07/22/2007   Qualifier: Diagnosis of  By: Lenn Cal    . Hyperlipidemia   . Hypertension `   during treatment with Geodon  . Obesity   . PTSD (post-traumatic stress disorder)   . Pulmonary hypertension 05/02/2012   Patient needs repeat echo in 06/2012   . Schizoaffective disorder    requiring multiple psychiatric admissions    Review of Systems:   See HPI  Physical Exam:  Vitals:   05/10/16 1054  BP: (!) 159/88  Pulse: 98  Temp: 98.4 F (36.9 C)  TempSrc: Oral  SpO2: 100%  Weight: 193 lb 14.4 oz (88 kg)  Height: 5\' 4"  (1.626 m)   Physical Exam  Constitutional: She is oriented to person, place, and time and well-developed, well-nourished, and in no distress. No distress.  HENT:  Head: Normocephalic and atraumatic.  Cardiovascular: Normal rate, regular rhythm and normal heart sounds.   Pulmonary/Chest: Effort normal and breath sounds normal. She has no wheezes. She has no rales.  Abdominal: Soft. Bowel sounds are normal.  Neurological: She is alert and oriented to person, place, and time.      Assessment & Plan:   See Encounters Tab for problem based charting.  Patient discussed with Dr. Dareen Piano

## 2016-05-10 NOTE — Assessment & Plan Note (Signed)
Patient with reports non-productive cough for the past 2 months. She was recently seen in the ED on 1/22 with complaints of cough and pleuritic chest pain. CXR at that time showed no acute abnormalities and no bony abnormalities. D-dimer was obtained and was negative. She was treated for bronchitis with doxycyline.   She is currently on ACE-I. She has not taken it for the past 3 days with improvement but not resolution of cough. She also has a history of GERD, on Nexium, and allergies.   Assessment: Chronic cough  Plan: Will discontinue ACE-I today RTC in 2-4 weeks for follow up. If no resolution of cough off ACE-I can consider further work up for etiology of chronic cough.

## 2016-05-10 NOTE — Patient Instructions (Addendum)
Ms. Nuhfer,  I am sorry to hear that you are having such a bad cough. I agree with the ED doctor that the Lisinopril could be the culprit. I am going to stop this medication today and get you started on a different blood pressure medication called, losaratan/cozaar 50 mg daily.  Please start taking this new medications and follow up in clinic in 2-3 weeks for blood pressure re-check.

## 2016-05-15 NOTE — Progress Notes (Signed)
Internal Medicine Clinic Attending  Case discussed with Dr. Boswell at the time of the visit.  We reviewed the resident's history and exam and pertinent patient test results.  I agree with the assessment, diagnosis, and plan of care documented in the resident's note.  

## 2016-05-24 ENCOUNTER — Other Ambulatory Visit: Payer: Self-pay | Admitting: Internal Medicine

## 2016-05-24 DIAGNOSIS — I10 Essential (primary) hypertension: Secondary | ICD-10-CM

## 2016-06-04 ENCOUNTER — Telehealth: Payer: Self-pay | Admitting: *Deleted

## 2016-06-04 ENCOUNTER — Other Ambulatory Visit: Payer: Self-pay | Admitting: Internal Medicine

## 2016-06-04 MED ORDER — INSULIN DETEMIR 100 UNIT/ML FLEXPEN: 16.0000 [IU] | PEN_INJECTOR | Freq: Every day | SUBCUTANEOUS | 11 refills | Status: DC

## 2016-06-04 NOTE — Telephone Encounter (Signed)
Received fax from [employer] stating Lantus is not covered under pt's plan. Formulary alternatives (tier 2) are Engineer, agricultural, levemir, or [place]. Will send info to pcp for review and medication change if appropriate. Please advise.Despina Hidden Cassady2/19/20182:51 PM  30-day supply cost for five 73ml pens is $10-$19 90-day supply cost for fifteen 17ml pens is $25-$47.50

## 2016-06-04 NOTE — Telephone Encounter (Signed)
Can switch to Levemir. Rx sent.

## 2016-06-05 NOTE — Telephone Encounter (Signed)
Dr. Wynetta Emery called patient with results on 04/04/2016

## 2016-06-13 DIAGNOSIS — F319 Bipolar disorder, unspecified: Secondary | ICD-10-CM | POA: Diagnosis not present

## 2016-06-14 ENCOUNTER — Other Ambulatory Visit: Payer: Self-pay | Admitting: Internal Medicine

## 2016-06-14 ENCOUNTER — Ambulatory Visit (INDEPENDENT_AMBULATORY_CARE_PROVIDER_SITE_OTHER): Payer: Medicare Other | Admitting: Internal Medicine

## 2016-06-14 ENCOUNTER — Encounter: Payer: Self-pay | Admitting: Internal Medicine

## 2016-06-14 VITALS — BP 175/88 | HR 84 | Temp 98.7°F | Ht 64.0 in | Wt 195.8 lb

## 2016-06-14 DIAGNOSIS — Z716 Tobacco abuse counseling: Secondary | ICD-10-CM | POA: Diagnosis not present

## 2016-06-14 DIAGNOSIS — F17200 Nicotine dependence, unspecified, uncomplicated: Secondary | ICD-10-CM

## 2016-06-14 DIAGNOSIS — I10 Essential (primary) hypertension: Secondary | ICD-10-CM | POA: Diagnosis not present

## 2016-06-14 DIAGNOSIS — F319 Bipolar disorder, unspecified: Secondary | ICD-10-CM | POA: Diagnosis not present

## 2016-06-14 DIAGNOSIS — F17210 Nicotine dependence, cigarettes, uncomplicated: Secondary | ICD-10-CM

## 2016-06-14 DIAGNOSIS — Z79899 Other long term (current) drug therapy: Secondary | ICD-10-CM | POA: Diagnosis not present

## 2016-06-14 DIAGNOSIS — Z5181 Encounter for therapeutic drug level monitoring: Secondary | ICD-10-CM

## 2016-06-14 MED ORDER — VARENICLINE TARTRATE 1 MG PO TABS: 1.0000 mg | ORAL_TABLET | Freq: Two times a day (BID) | ORAL | 3 refills | Status: DC

## 2016-06-14 NOTE — Progress Notes (Signed)
   CC: HTN f/up  HPI:  [name] is a 51 y.o. with PMH as listed below who presents for HTN follow up, also wants to discuss smoking cessation.  Past Medical History:  Diagnosis Date  . Alcohol abuse   . Anemia, iron deficiency   . Arteriosclerotic cardiovascular disease (ASCVD)    Minimal at cath in [facility].stress nuclear study in 8/08 with nl EF; neg stress echo in 2010  . Community acquired pneumonia 01/03/10, 05/2010, 04/2012   2011; with pleural effusion-[facility] acute resp failure; intubated in Jan 2014 (HMPV pneumonia)  . Depression   . Diabetes mellitus, type 2 ([place]) 2000   Onset in 2000; no insulin  . Diastolic dysfunction    grade 2 per echo 2011  . Dysphagia   . Gastroesophageal reflux disease    Schatzki's ring  . History of alcohol abuse 07/22/2007   Qualifier: Diagnosis of  By: Lenn Cal    . Hyperlipidemia   . Hypertension `   during treatment with Geodon  . Obesity   . PTSD (post-traumatic stress disorder)   . Pulmonary hypertension 05/02/2012   Patient needs repeat echo in 06/2012   . Schizoaffective disorder    requiring multiple psychiatric admissions   HTN: Her ACE-I was d/ced due to cough last visit 05/10/2016. Her cough has improved siginificantly, now has a milder cough which started few days ago but less severe than prior cough. Currently on losartan 50mg  daily ( started last visit), lopressor 100mg  BID, lasix 20mg  daily. Drinks 4 pepsi drinks daily (caffeinated), denies eating salty food.   Interested in quitting smoking. We have discussed risk of complication of chantix with Bipolar disorder but her psychiatrist sent a letter stating new data showed that chantix would be safe for this patient. She has tried nicotine patch and gums and bupropion in the past without success. She is smoking 1 pack a day for last 37 years and very eager to quit smoking to improve her health.   Review of Systems:    Review of Systems    Constitutional: Negative for chills and fever.  Respiratory: Positive for cough. Negative for sputum production and shortness of breath.   Cardiovascular: Negative for chest pain and palpitations.  Neurological: Negative for dizziness and headaches.     Physical Exam:  Vitals:   06/14/16 1535  BP: (!) 175/88  Pulse: 84  Temp: 98.7 F (37.1 C)  TempSrc: Oral  SpO2: 100%  Weight: 195 lb 12.8 oz (88.8 kg)  Height: 5\' 4"  (1.626 m)   Physical Exam  Constitutional: She is oriented to person, place, and time. She appears well-developed and well-nourished. No distress.  HENT:  Head: Normocephalic and atraumatic.  Eyes: Conjunctivae are normal.  Cardiovascular: Normal rate and regular rhythm.  Exam reveals no gallop and no friction rub.   No murmur heard. Respiratory: Effort normal and breath sounds normal. No respiratory distress. She has no wheezes.  GI: Soft. Bowel sounds are normal. She exhibits no distension. There is no tenderness.  Musculoskeletal: Normal range of motion. She exhibits no edema.  Neurological: She is alert and oriented to person, place, and time. No cranial nerve deficit.  Skin: She is not diaphoretic.    Assessment & Plan:   See Encounters Tab for problem based charting.  Patient discussed with Dr. Lynnae January

## 2016-06-14 NOTE — Assessment & Plan Note (Signed)
Cautioned patient about the risk of chantix with her hx of bipolar disorder. Her psychiatrist sent a letter stating new data showed that chantix would be safe for this patient. Patient discussed this with her psychiatrist and wants to try chantix.  Prescribed chantix per up-to-date smoking cessation protocol for 11 weeks. Asked to set quit date 1 week from initiation. Asked to call us with any problems.

## 2016-06-14 NOTE — Assessment & Plan Note (Signed)
Patient is taking Geodon, her psychiatrist request Lipid panel to monitor for dyslipidemia with Geodon.  Placed order for lipid panel.

## 2016-06-14 NOTE — Patient Instructions (Addendum)
Will check your labs today. Will call you and likely will need to increase your Losartan.   Take chantix as following:  Days 1 to 3: 0.5 mg once daily  Days 4 to 7: 0.5 mg twice daily  After Day 8): 1 mg twice daily for 11 weeks  If you have any problem, please call us.   Ideally, you should set you quit date to 1 week from the initiation of Chantix    Varenicline oral tablets What is this medicine? VARENICLINE (var EN i kleen) is used to help people quit smoking. It can reduce the symptoms caused by stopping smoking. It is used with a patient support program recommended by your physician. This medicine may be used for other purposes; ask your health care provider or pharmacist if you have questions. COMMON BRAND NAME(S): Chantix What should I tell my health care provider before I take this medicine? They need to know if you have any of these conditions: -bipolar disorder, depression, schizophrenia or other mental illness -heart disease -if you often drink alcohol -kidney disease -peripheral vascular disease -seizures -stroke -suicidal thoughts, plans, or attempt; a previous suicide attempt by you or a family member -an unusual or allergic reaction to varenicline, other medicines, foods, dyes, or preservatives -pregnant or trying to get pregnant -breast-feeding How should I use this medicine? Take this medicine by mouth after eating. Take with a full glass of water. Follow the directions on the prescription label. Take your doses at regular intervals. Do not take your medicine more often than directed. There are 3 ways you can use this medicine to help you quit smoking; talk to your health care professional to decide which plan is right for you: 1) you can choose a quit date and start this medicine 1 week before the quit date, or, 2) you can start taking this medicine before you choose a quit date, and then pick a quit date between day 8 and 35 days of treatment, or, 3) if you  are not sure that you are able or willing to quit smoking right away, start taking this medicine and slowly decrease the amount you smoke as directed by your health care professional with the goal of being cigarette-free by week 12 of treatment. Stick to your plan; ask about support groups or other ways to help you remain cigarette-free. If you are motivated to quit smoking and did not succeed during a previous attempt with this medicine for reasons other than side effects, or if you returned to smoking after this treatment, speak with your health care professional about whether another course of this medicine may be right for you. A special MedGuide will be given to you by the pharmacist with each prescription and refill. Be sure to read this information carefully each time. Talk to your pediatrician regarding the use of this medicine in children. This medicine is not approved for use in children. Overdosage: If you think you have taken too much of this medicine contact a poison control center or emergency room at once. NOTE: This medicine is only for you. Do not share this medicine with others. What if I miss a dose? If you miss a dose, take it as soon as you can. If it is almost time for your next dose, take only that dose. Do not take double or extra doses. What may interact with this medicine? -alcohol or any product that contains alcohol -insulin -other stop smoking aids -theophylline -warfarin This list may not describe all  possible interactions. Give your health care provider a list of all the medicines, herbs, non-prescription drugs, or dietary supplements you use. Also tell them if you smoke, drink alcohol, or use illegal drugs. Some items may interact with your medicine. What should I watch for while using this medicine? Visit your doctor or health care professional for regular check ups. Ask for ongoing advice and encouragement from your doctor or healthcare professional, friends, and  family to help you quit. If you smoke while on this medication, quit again Your mouth may get dry. Chewing sugarless gum or sucking hard candy, and drinking plenty of water may help. Contact your doctor if the problem does not go away or is severe. You may get drowsy or dizzy. Do not drive, use machinery, or do anything that needs mental alertness until you know how this medicine affects you. Do not stand or sit up quickly, especially if you are an older patient. This reduces the risk of dizzy or fainting spells. Sleepwalking can happen during treatment with this medicine, and can sometimes lead to behavior that is harmful to you, other people, or property. Stop taking this medicine and tell your doctor if you start sleepwalking or have other unusual sleep-related activity. Decrease the amount of alcoholic beverages that you drink during treatment with this medicine until you know if this medicine affects your ability to tolerate alcohol. Some people have experienced increased drunkenness (intoxication), unusual or sometimes aggressive behavior, or no memory of things that have happened (amnesia) during treatment with this medicine. The use of this medicine may increase the chance of suicidal thoughts or actions. Pay special attention to how you are responding while on this medicine. Any worsening of mood, or thoughts of suicide or dying should be reported to your health care professional right away. What side effects may I notice from receiving this medicine? Side effects that you should report to your doctor or health care professional as soon as possible: -allergic reactions like skin rash, itching or hives, swelling of the face, lips, tongue, or throat -acting aggressive, being angry or violent, or acting on dangerous impulses -breathing problems -changes in vision -chest pain or chest tightness -confusion, trouble speaking or understanding -new or worsening depression, anxiety, or panic  attacks -extreme increase in activity and talking (mania) -fast, irregular heartbeat -feeling faint or lightheaded, falls -fever -pain in legs when walking -problems with balance, talking, walking -redness, blistering, peeling or loosening of the skin, including inside the mouth -ringing in ears -seeing or hearing things that aren't there (hallucinations) -seizures -sleepwalking -sudden numbness or weakness of the face, arm or leg -thoughts about suicide or dying, or attempts to commit suicide -trouble passing urine or change in the amount of urine -unusual bleeding or bruising -unusually weak or tired Side effects that usually do not require medical attention (report to your doctor or health care professional if they continue or are bothersome): -constipation -headache -nausea, vomiting -strange dreams -stomach gas -trouble sleeping This list may not describe all possible side effects. Call your doctor for medical advice about side effects. You may report side effects to FDA at 1-800-FDA-1088. Where should I keep my medicine? Keep out of the reach of children. Store at room temperature between 15 and 30 degrees C (59 and 86 degrees F). Throw away any unused medicine after the expiration date. NOTE: This sheet is a summary. It may not cover all possible information. If you have questions about this medicine, talk to your doctor, pharmacist, or health  care provider.  2018 Elsevier/Gold Standard (2014-12-16 16:14:23)

## 2016-06-14 NOTE — Assessment & Plan Note (Addendum)
Currently on losartan 50mg  daily ( started last visit, switched from Lisinopril due to cough), lopressor 100mg  BID, lasix 20mg  daily.   BP remains high today. Vitals:   06/14/16 1535  BP: (!) 175/88  Pulse: 84  Temp: 98.7 F (37.1 C)   Will check BMET, if kidney function and electrolytes are fine will likely increase losartan to 100mg  daily. Asked her to cut down on caffeinated soda.   BMP Latest Ref Rng & Units 06/14/2016 05/08/2016 10/26/2015  Glucose 65 - 99 mg/dL 131(H) 221(H) 98  BUN 6 - 24 mg/dL 7 8 11   Creatinine 0.57 - 1.00 mg/dL 1.12(H) 0.94 0.96  BUN/Creat Ratio 9 - 23 6(L) - 11  Sodium 134 - 144 mmol/L 142 136 138  Potassium 3.5 - 5.2 mmol/L 4.0 3.6 4.1  Chloride 96 - 106 mmol/L 102 102 100  CO2 18 - 29 mmol/L 22 26 23   Calcium 8.7 - 10.2 mg/dL 9.4 9.1 9.6    BMET showed very mild increase in crt. I will increase losartan to 100mg  daily.

## 2016-06-15 LAB — BASIC METABOLIC PANEL
BUN/Creatinine Ratio: 6 — ABNORMAL LOW (ref 9–23)
BUN: 7 mg/dL (ref 6–24)
CHLORIDE: 102 mmol/L (ref 96–106)
CO2: 22 mmol/L (ref 18–29)
Calcium: 9.4 mg/dL (ref 8.7–10.2)
Creatinine, Ser: 1.12 mg/dL — ABNORMAL HIGH (ref 0.57–1.00)
GFR calc non Af Amer: 58 mL/min/{1.73_m2} — ABNORMAL LOW (ref >59)
GFR, EST AFRICAN AMERICAN: 67 mL/min/{1.73_m2} (ref >59)
Glucose: 131 mg/dL — ABNORMAL HIGH (ref 65–99)
POTASSIUM: 4 mmol/L (ref 3.5–5.2)
Sodium: 142 mmol/L (ref 134–144)

## 2016-06-15 LAB — LIPID PANEL
Chol/HDL Ratio: 3 ratio units (ref 0.0–4.4)
Cholesterol, Total: 123 mg/dL (ref 100–199)
HDL: 41 mg/dL (ref >39)
LDL Calculated: 54 mg/dL (ref 0–99)
Triglycerides: 142 mg/dL (ref 0–149)
VLDL Cholesterol Cal: 28 mg/dL (ref 5–40)

## 2016-06-15 MED ORDER — LOSARTAN POTASSIUM 100 MG PO TABS: 100.0000 mg | ORAL_TABLET | Freq: Every day | ORAL | 2 refills | Status: DC

## 2016-06-15 NOTE — Progress Notes (Signed)
Internal Medicine Clinic Attending  Case discussed with Dr. Ahmed at the time of the visit.  We reviewed the resident's history and exam and pertinent patient test results.  I agree with the assessment, diagnosis, and plan of care documented in the resident's note. 

## 2016-06-15 NOTE — Progress Notes (Signed)
BMP and Lipid results from 06-14-16 faxed to  [place]:  Waymon Budge [phone]  at 06-15-16  [address], [place] 06-15-16  [phone]

## 2016-06-23 IMAGING — DX DG CHEST 2V
2 series · 2 of 2 positions shown · non-contrast
Comparison: Chest radiograph performed 12/04/2014

CLINICAL DATA: Acute onset of productive cough, generalized chest
pain and shortness of breath. Initial encounter.

EXAM:
CHEST  2 VIEW

[chest pa]
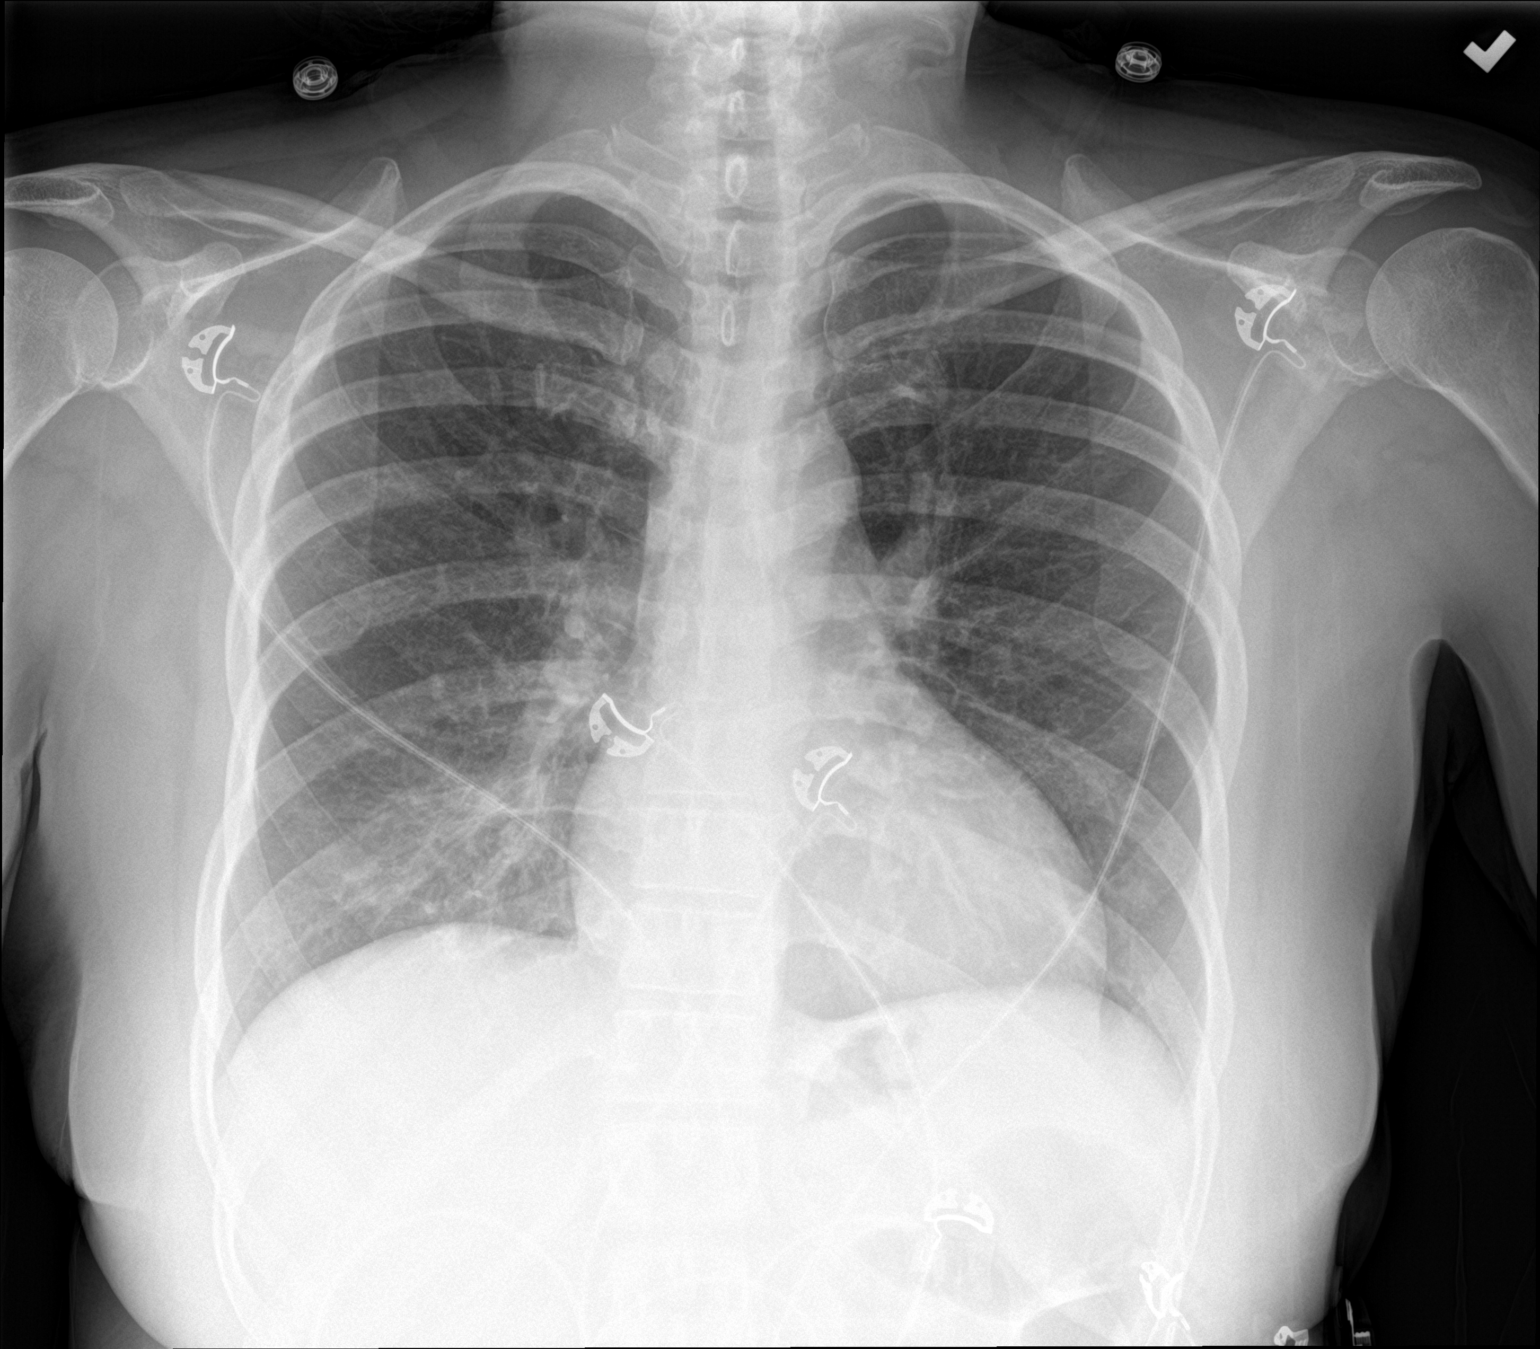

[chest lat]
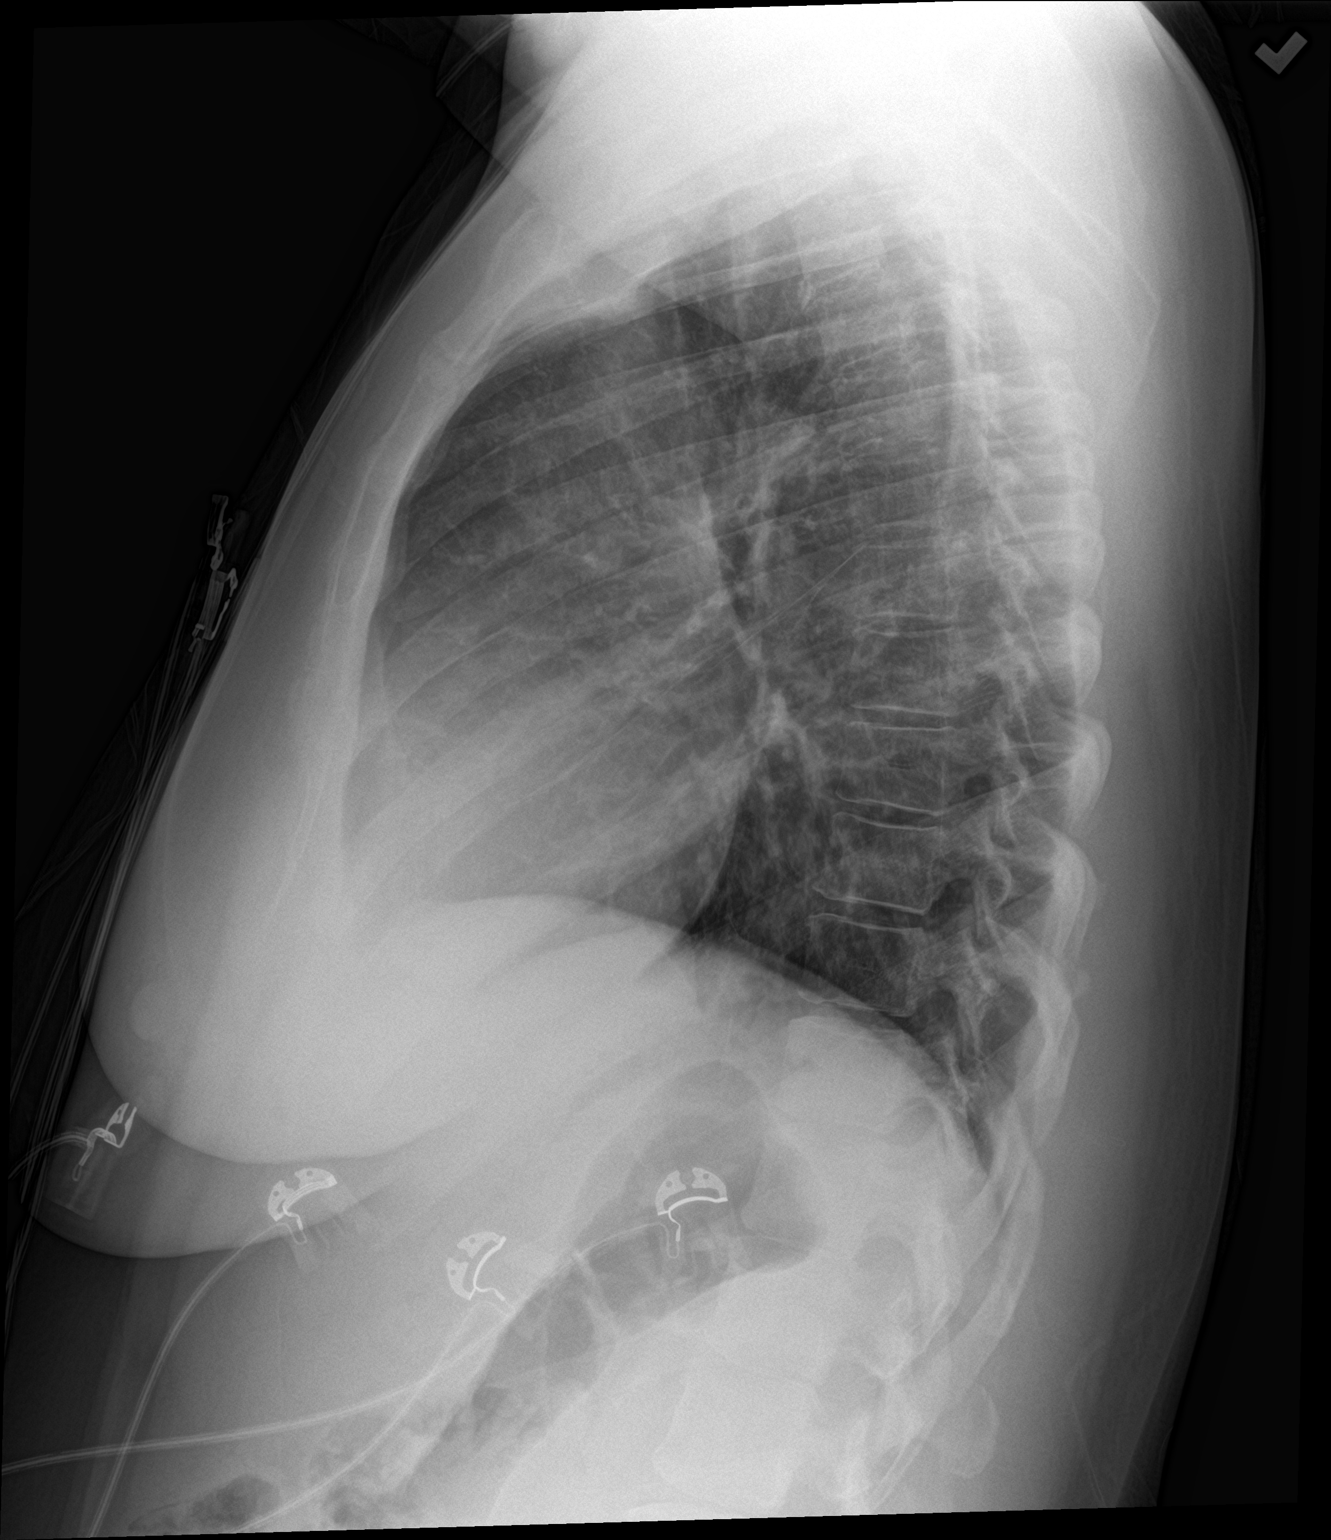

[2 of 2 positions shown; findings below may reference images not displayed]

FINDINGS: The lungs are well-aerated. Minimal bibasilar opacities likely
reflect atelectasis. There is no evidence of pleural effusion or
pneumothorax.

The heart is normal in size; the mediastinal contour is within
normal limits. No acute osseous abnormalities are seen.
IMPRESSION: Minimal bibasilar opacities likely reflect atelectasis.

## 2016-07-02 ENCOUNTER — Other Ambulatory Visit: Payer: Self-pay | Admitting: Gastroenterology

## 2016-07-04 NOTE — Telephone Encounter (Signed)
Pt prefers pantoprazole.

## 2016-07-04 NOTE — Telephone Encounter (Signed)
Tried to call pt- NA- LMOM 

## 2016-07-04 NOTE — Telephone Encounter (Signed)
Per Eric's last note, patient was on Nexium. Please clarify as we are getting request for pantoprazole.

## 2016-07-05 MED ORDER — PANTOPRAZOLE SODIUM 40 MG PO TBEC: 40.0000 mg | DELAYED_RELEASE_TABLET | Freq: Two times a day (BID) | ORAL | 5 refills | Status: DC

## 2016-07-05 NOTE — Telephone Encounter (Signed)
rx done

## 2016-07-05 NOTE — Addendum Note (Signed)
Addended by: Mahala Menghini on: 07/05/2016 05:39 PM   Modules accepted: Orders

## 2016-07-12 ENCOUNTER — Encounter (HOSPITAL_COMMUNITY): Payer: Self-pay

## 2016-07-12 ENCOUNTER — Emergency Department (HOSPITAL_COMMUNITY)
Admission: EM | Admit: 2016-07-12 | Discharge: 2016-07-12 | Disposition: A | Discharge status: Home / Self Care | Payer: Medicare Other | Attending: Emergency Medicine | Admitting: Emergency Medicine

## 2016-07-12 ENCOUNTER — Emergency Department (HOSPITAL_COMMUNITY): Payer: Medicare Other

## 2016-07-12 DIAGNOSIS — J40 Bronchitis, not specified as acute or chronic: Secondary | ICD-10-CM | POA: Insufficient documentation

## 2016-07-12 DIAGNOSIS — Z794 Long term (current) use of insulin: Secondary | ICD-10-CM | POA: Diagnosis not present

## 2016-07-12 DIAGNOSIS — J449 Chronic obstructive pulmonary disease, unspecified: Secondary | ICD-10-CM | POA: Insufficient documentation

## 2016-07-12 DIAGNOSIS — F17210 Nicotine dependence, cigarettes, uncomplicated: Secondary | ICD-10-CM | POA: Insufficient documentation

## 2016-07-12 DIAGNOSIS — R05 Cough: Secondary | ICD-10-CM | POA: Diagnosis present

## 2016-07-12 DIAGNOSIS — I10 Essential (primary) hypertension: Secondary | ICD-10-CM | POA: Diagnosis not present

## 2016-07-12 DIAGNOSIS — E119 Type 2 diabetes mellitus without complications: Secondary | ICD-10-CM | POA: Insufficient documentation

## 2016-07-12 MED ORDER — PREDNISONE 20 MG PO TABS
60.0000 mg | ORAL_TABLET | Freq: Every day | ORAL | Status: DC
  Administered 2016-07-12: 60 mg via ORAL
  Filled 2016-07-12: qty 3

## 2016-07-12 MED ORDER — PREDNISONE 10 MG PO TABS: ORAL_TABLET | ORAL | 0 refills | Status: DC

## 2016-07-12 MED ORDER — DOXYCYCLINE HYCLATE 100 MG PO TABS
100.0000 mg | ORAL_TABLET | Freq: Once | ORAL | Status: AC
  Administered 2016-07-12: 100 mg via ORAL
  Filled 2016-07-12: qty 1

## 2016-07-12 MED ORDER — DOXYCYCLINE HYCLATE 100 MG PO CAPS: 100.0000 mg | ORAL_CAPSULE | Freq: Two times a day (BID) | ORAL | 0 refills | Status: DC

## 2016-07-12 NOTE — ED Triage Notes (Signed)
PT C/O COUGH AND FATIGUE SINCE Saturday. PT STS SHE HAS FELT HOT AT TIMES. PT HAS BEEN USING TYLENOL DAY AND NIGHT WITH NO RELIEF.

## 2016-07-12 NOTE — ED Provider Notes (Signed)
[facility] DEPT Provider Note   CSN: [id number] Arrival date & time: 07/12/16  2046  By signing my name below, I, Collene Leyden, attest that this documentation has been prepared under the direction and in the presence of Helen Hashimoto, [place]. Electronically Signed: Collene Leyden, Scribe. 07/12/16. 10:03 PM.  History   Chief Complaint Chief Complaint  Patient presents with  . Cough    HPI Comments: [name] is a 51 y.o. female with a history of a chronic cough, who presents to the Emergency Department complaining of a persistent productive cough that began 6 days ago. Patient reports having lung problems in the past, resulting in being placed on the ventilator. Patient is a current smoker, 1 pack a day. Patient has been smoking since she was 51 years old. Patient reports associated sinus pressure, nasal congestion, and sneezing. No modifying factors indicated. Patient denies any nausea, vomiting, fever, diarrhea, or chest pain.   The history is provided by the patient. No language interpreter was used.    Past Medical History:  Diagnosis Date  . Alcohol abuse   . Anemia, iron deficiency   . Arteriosclerotic cardiovascular disease (ASCVD)    Minimal at cath in [facility].stress nuclear study in 8/08 with nl EF; neg stress echo in 2010  . Community acquired pneumonia 01/03/10, 05/2010, 04/2012   2011; with pleural effusion-[facility] acute resp failure; intubated in Jan 2014 (HMPV pneumonia)  . Depression   . Diabetes mellitus, type 2 ([place]) 2000   Onset in 2000; no insulin  . Diastolic dysfunction    grade 2 per echo 2011  . Dysphagia   . Gastroesophageal reflux disease    Schatzki's ring  . History of alcohol abuse 07/22/2007   Qualifier: Diagnosis of  By: Lenn Cal    . Hyperlipidemia   . Hypertension `   during treatment with Geodon  . Obesity   . PTSD (post-traumatic stress disorder)   . Pulmonary hypertension 05/02/2012   Patient needs repeat  echo in 06/2012   . Schizoaffective disorder    requiring multiple psychiatric admissions    Patient Active Problem List   Diagnosis Date Noted  . Chronic cough 05/10/2016  . Hyperglycemia 04/02/2016  . Dysphagia   . Diabetes mellitus type II, controlled ([place]) 12/19/2014  . Burning sensation 12/15/2014  . Chest pain 12/15/2014  . Palpitations 12/15/2014  . Encounter for health education 10/24/2014  . Fatigue 10/04/2014  . Left knee pain 08/25/2014  . Vision changes 08/12/2014  . Pulsatile tinnitus 08/12/2014  . IIH (idiopathic intracranial hypertension) 08/12/2014  . Nausea with vomiting 07/26/2014  . Seasonal allergies 02/23/2014  . Flank pain, acute 01/01/2014  . Hypokalemia 12/25/2013  . Headache 12/07/2013  . Dyspepsia 11/19/2013  . Leg swelling 10/21/2013  . Diarrhea 10/14/2013  . Daytime hypersomnolence 12/09/2012  . COPD (chronic obstructive pulmonary disease) ([place]) 10/20/2012  . GERD (gastroesophageal reflux disease) 10/15/2012  . Preventative health care 10/15/2012  . Pulmonary hypertension 05/02/2012  . Diastolic heart failure ([place]) 02/07/2012  . IDA (iron deficiency anemia) 11/06/2011  . Arteriosclerotic cardiovascular disease (ASCVD)   . TOBACCO ABUSE 09/27/2009  . Hyperlipidemia 07/22/2007  . Obesity 07/22/2007  . Bipolar 1 disorder ([place]) 07/22/2007  . Essential hypertension 07/22/2007  . Diabetes mellitus type 2, controlled ([place]) 09/28/2002    Past Surgical History:  Procedure Laterality Date  . COLONOSCOPY  01/2006   internal hemorrhoids  . COLONOSCOPY  01/10/2012   Dr. Rourk:Single anal canal hemorrhoidal tag likely  source of  trivial hematochezia; right-sided colonic diverticulosis  . DILATION AND CURETTAGE, DIAGNOSTIC / THERAPEUTIC  1992  . ESOPHAGEAL DILATION N/A 08/18/2015   Procedure: ESOPHAGEAL DILATION;  Surgeon: Daneil Dolin, MD;  Location: [facility];  Service: Endoscopy;  Laterality: N/A;  . ESOPHAGOGASTRODUODENOSCOPY  09/16/08   Dr.  Trevor Iha hiatal hernia/excoriations involving the cardia and mucosa consistent with trauma, antral erosions  of linear petechiae ? gastritis versus early gastric antral vascular  ectasia.Marland Kitchen biopsy showed reactive gastropathy. No H. pylori.  . ESOPHAGOGASTRODUODENOSCOPY  09/2007   Dr. Evalee Mutton ring, dilated to 57 French Maloney dilator, small hiatal hernia, antral erosions, biopsies reactive gastropathy.  . ESOPHAGOGASTRODUODENOSCOPY (EGD) WITH PROPOFOL N/A 12/17/2013   GYJ:EHUDJSH antral erosions and petechiae. Small hiatal hernia. No endoscopic explanation for patient's symptoms  . ESOPHAGOGASTRODUODENOSCOPY (EGD) WITH PROPOFOL N/A 08/18/2015   Procedure: ESOPHAGOGASTRODUODENOSCOPY (EGD) WITH PROPOFOL;  Surgeon: Daneil Dolin, MD;  Location: [facility];  Service: Endoscopy;  Laterality: N/A;  0845  . SAVORY DILATION  07/17/2011   [facility] sedation-->distal esophageal stricture s/p dilation, chronic gastritis, multiple ulcers in stomach. no h.pylori    OB History    Gravida Para Term Preterm AB Living   _0 SAB TAB Ectopic Multiple Live Births     1     2       Home Medications    Prior to Admission medications   Medication Sig Start Date End Date Taking? Authorizing Provider  ACCU-CHEK FASTCLIX LANCETS MISC Check blood sugar three times a day 12/12/15   Bartholomew Crews, MD  acetaminophen (TYLENOL) 500 MG tablet Take 1,000 mg by mouth every 6 (six) hours as needed for mild pain.    Historical Provider, MD  albuterol (PROVENTIL HFA;VENTOLIN HFA) 108 (90 BASE) MCG/ACT inhaler Inhale 2 puffs into the lungs every 4 (four) hours as needed for wheezing or shortness of breath. 02/22/15   Lysbeth Penner, FNP  benzonatate (TESSALON) 100 MG capsule Take 1 capsule (100 mg total) by mouth every 8 (eight) hours. 05/08/16   Ezequiel Essex, MD  doxycycline (VIBRAMYCIN) 100 MG capsule Take 1 capsule (100 mg total) by mouth 2 (two) times daily. 05/08/16   Ezequiel Essex, MD    ferrous sulfate 325 (65 FE) MG tablet Take 1 tablet (325 mg total) by mouth daily. 04/03/16 04/03/17  Asencion Partridge, MD  furosemide (LASIX) 20 MG tablet Take 1 tablet (20 mg total) by mouth daily. 03/27/16   Holley Raring, MD  glucose blood (ACCU-CHEK SMARTVIEW) test strip Check blood sugar three times a day 12/12/15   Bartholomew Crews, MD  guaiFENesin-codeine 100-10 MG/5ML syrup Take 10 mLs by mouth every 6 (six) hours as needed for cough. 07/30/15   Forde Dandy, MD  ibuprofen (ADVIL,MOTRIN) 200 MG tablet Take 400 mg by mouth every 6 (six) hours as needed for mild pain.    Historical Provider, MD  Insulin Detemir (LEVEMIR FLEXPEN) 100 UNIT/ML Pen Inject 16 Units into the skin daily at 10 pm. 06/04/16   Holley Raring, MD  loratadine (CLARITIN) 10 MG tablet TAKE ONE TABLET BY MOUTH ONCE DAILY 06/14/16   Holley Raring, MD  losartan (COZAAR) 100 MG tablet Take 1 tablet (100 mg total) by mouth daily. 06/15/16   Tasrif Ahmed, MD  lovastatin (MEVACOR) 40 MG tablet Take 1 tablet (40 mg total) by mouth daily. 12/23/15 12/22/16  Holley Raring, MD  metFORMIN (GLUCOPHAGE) 1000 MG tablet TAKE (1) TABLET BY  MOUTH TWICE DAILY WITH MEALS. 10/26/15   Holley Raring, MD  metoprolol (LOPRESSOR) 100 MG tablet TAKE 1 TABLET BY MOUTH TWICE DAILY. 05/25/16   Holley Raring, MD  mometasone (NASONEX) 50 MCG/ACT nasal spray Place 2 sprays into the nose daily. 10/26/15   Holley Raring, MD  Multiple Vitamins-Minerals (MULTIVITAMIN WITH MINERALS) tablet Take 1 tablet by mouth daily.    Historical Provider, MD  naproxen (NAPROSYN) 500 MG tablet Take 1 tablet (500 mg total) by mouth 2 (two) times daily. 05/08/16   Ezequiel Essex, MD  NOVOFINE 32G X 6 MM MISC USE TO INJECT INSULIN ONCE DAILY. 03/06/16   Holley Raring, MD  ondansetron (ZOFRAN) 4 MG tablet Take 1 tablet (4 mg total) by mouth every 8 (eight) hours as needed for nausea or vomiting. 04/16/15   Daleen Bo, MD  ondansetron (ZOFRAN) 4 MG tablet Take 1 tablet (4 mg total) by mouth  every 6 (six) hours. 10/07/15   Virgel Manifold, MD  pantoprazole (PROTONIX) 40 MG tablet Take 1 tablet (40 mg total) by mouth 2 (two) times daily before a meal. 07/05/16   Mahala Menghini, PA-C  potassium chloride SA (K-DUR,KLOR-CON) 20 MEQ tablet TAKE 1 TABLET BY MOUTH DAILY. 04/23/16   Holley Raring, MD  traMADol (ULTRAM) 50 MG tablet Take 1 tablet (50 mg total) by mouth every 6 (six) hours as needed. 10/07/15   Virgel Manifold, MD  varenicline (CHANTIX CONTINUING MONTH PAK) 1 MG tablet Take 1 tablet (1 mg total) by mouth 2 (two) times daily. 06/14/16   Tasrif Ahmed, MD  ziprasidone (GEODON) 80 MG capsule Take 160 mg by mouth daily.    Historical Provider, MD    Family History Family History  Problem Relation Age of Onset  . Hypertension Mother   . Stroke Father     deceased at age 37  . Colon cancer Other   . Heart disease Sister   . Diabetes    . High Cholesterol    . Arthritis    . Anesthesia problems Neg Hx   . Hypotension Neg Hx   . Malignant hyperthermia Neg Hx   . Pseudochol deficiency Neg Hx     Social History Social History  Substance Use Topics  . Smoking status: Current Every Day Smoker    Packs/day: 1.00    Years: 30.00    Types: Cigarettes    Start date: 05/18/2012  . Smokeless tobacco: Never Used     Comment: 10-15 per day  . Alcohol use No     Comment: hx of ETOH abuse     Allergies   Cephalexin; Metronidazole; Orange; Shrimp [shellfish allergy]; Penicillins; Sulfonamide derivatives; Glipizide; Ace inhibitors; and Sulfamethoxazole-trimethoprim   Review of Systems Review of Systems  Constitutional: Negative for fever.  HENT: Positive for congestion, sinus pressure and sneezing.   Gastrointestinal: Negative for diarrhea, nausea and vomiting.  All other systems reviewed and are negative.    Physical Exam Updated Vital Signs BP (!) 152/95 (BP Location: Right Arm)   Pulse 85   Temp 98.6 F (37 C) (Oral)   Resp 18   Ht _0  (1.626 m)   Wt 190 lb (86.2 kg)    LMP 06/01/2016   SpO2 94%   BMI 32.61 kg/m   Physical Exam  Constitutional: She is oriented to person, place, and time. She appears well-developed.  HENT:  Head: Normocephalic and atraumatic.  Mouth/Throat: Oropharynx is clear and moist. No oropharyngeal exudate.  Harsh cough.   Eyes: Conjunctivae and  EOM are normal. Pupils are equal, round, and reactive to light.  Neck: Normal range of motion. Neck supple.  Cardiovascular: Normal rate.   Pulmonary/Chest: Effort normal.  Abdominal: Soft. Bowel sounds are normal.  Musculoskeletal: Normal range of motion.  Neurological: She is alert and oriented to person, place, and time.  Skin: Skin is warm and dry.  Psychiatric: She has a normal mood and affect.  Nursing note and vitals reviewed.    ED Treatments / Results  DIAGNOSTIC STUDIES: Oxygen Saturation is 94% on RA, adequate by my interpretation.    COORDINATION OF CARE: 10:03 PM Discussed treatment plan with pt at bedside and pt agreed to plan, which includes doxycycline and prednisone.   Labs (all labs ordered are listed, but only abnormal results are displayed) Labs Reviewed - No data to display  EKG  EKG Interpretation None       Radiology Dg Chest 2 View  Result Date: 07/12/2016 CLINICAL DATA:  Cough for 5 days, sore throat. EXAM: CHEST  2 VIEW COMPARISON:  Chest radiograph May 07, 2016 FINDINGS: Cardiomediastinal silhouette is normal. No pleural effusions or focal consolidations. Trachea projects midline and there is no pneumothorax. Soft tissue planes and included osseous structures are non-suspicious. IMPRESSION: Normal chest. Electronically Signed   By: Elon Alas M.D.   On: 07/12/2016 21:48    Procedures Procedures (including critical care time)  Medications Ordered in ED Medications - No data to display   Initial Impression / Assessment and Plan / ED Course  I have reviewed the triage vital signs and the nursing notes.  Pertinent labs &  imaging results that were available during my care of the patient were reviewed by me and considered in my medical decision making (see chart for details).       Final Clinical Impressions(s) / ED Diagnoses   Final diagnoses:  Bronchitis    New Prescriptions Discharge Medication List as of 07/12/2016 10:05 PM    START taking these medications   Details  predniSONE (DELTASONE) 10 MG tablet 5,4,3,2,1 taper, Print       An After Visit Summary was printed and given to the patient. Meds ordered this encounter  Medications  . doxycycline (VIBRAMYCIN) 100 MG capsule    Sig: Take 1 capsule (100 mg total) by mouth 2 (two) times daily.    Dispense:  20 capsule    Refill:  0    Order Specific Question:   Supervising Provider    Answer:   MILLER, BRIAN [[id number]]  . predniSONE (DELTASONE) 10 MG tablet    Sig: 5,4,3,2,1 taper    Dispense:  15 tablet    Refill:  0    Order Specific Question:   Supervising Provider    Answer:   MILLER, BRIAN [[id number]]  . doxycycline (VIBRA-TABS) tablet 100 mg  . predniSONE (DELTASONE) tablet 60 mg   I personally performed the services in this documentation, which was scribed in my presence.  The recorded information has been reviewed and considered.   Ronnald Collum.   Hollace Kinnier Holiday, PA-C 07/13/16 0110    [name], MD 07/13/16 [phone]

## 2016-07-13 ENCOUNTER — Other Ambulatory Visit: Payer: Self-pay | Admitting: Internal Medicine

## 2016-07-26 ENCOUNTER — Encounter: Payer: Medicare Other | Admitting: Internal Medicine

## 2016-08-10 ENCOUNTER — Telehealth: Payer: Self-pay

## 2016-08-10 NOTE — Telephone Encounter (Signed)
error 

## 2016-08-14 ENCOUNTER — Telehealth: Payer: Self-pay | Admitting: Internal Medicine

## 2016-08-14 MED ORDER — ESOMEPRAZOLE MAGNESIUM 40 MG PO CPDR: DELAYED_RELEASE_CAPSULE | ORAL | 3 refills | Status: DC

## 2016-08-14 NOTE — Telephone Encounter (Signed)
Routing to the refill box. Pt is currently taking protonix.

## 2016-08-14 NOTE — Addendum Note (Signed)
Addended by: Mahala Menghini on: 08/14/2016 02:27 PM   Modules accepted: Orders

## 2016-08-14 NOTE — Telephone Encounter (Signed)
rx done

## 2016-08-14 NOTE — Telephone Encounter (Signed)
[place] TO HER PHARMACY, SHE STATES THE MEDICATION SHE IS ON NOW FOR REFLUX IS NOT WORKING, [facility] IN [place]

## 2016-08-16 ENCOUNTER — Encounter: Payer: Medicare Other | Admitting: Internal Medicine

## 2016-08-24 ENCOUNTER — Telehealth: Payer: Self-pay

## 2016-08-24 NOTE — Telephone Encounter (Signed)
Patient called & stated the lantus she was taking no longer covered by her insurance. Her medlist shows Levemir sent to [facility] on 06/04/16 x 11 refills. PA to switch from lantus done that day. Informed patient & she said she will call [facility].

## 2016-08-24 NOTE — Telephone Encounter (Signed)
This has been addressed by daisy w. rn

## 2016-08-24 NOTE — Telephone Encounter (Signed)
Requesting to speak with a nurse about meds.  

## 2016-08-27 ENCOUNTER — Telehealth: Payer: Self-pay

## 2016-08-27 ENCOUNTER — Other Ambulatory Visit: Payer: Self-pay | Admitting: Internal Medicine

## 2016-08-27 DIAGNOSIS — E119 Type 2 diabetes mellitus without complications: Secondary | ICD-10-CM

## 2016-08-27 DIAGNOSIS — Z794 Long term (current) use of insulin: Principal | ICD-10-CM

## 2016-08-27 NOTE — Telephone Encounter (Signed)
LMV for appt reminder.

## 2016-08-27 NOTE — Telephone Encounter (Signed)
Refill request    Insulin Detemir (LEVEMIR FLEXPEN) 100 UNIT/ML Pen  ACCU-CHEK FASTCLIX LANCETS MISC   glucose blood (ACCU-CHEK SMARTVIEW) test strip

## 2016-08-27 NOTE — Telephone Encounter (Signed)
Per pharmacy-pt has refills left on Levemir Flexpen.  Will send request for test strips and lancets to pcp for review. Regenia Skeeter, Darlene Cassady5/14/20182:43 PM

## 2016-08-28 ENCOUNTER — Ambulatory Visit: Payer: Medicare Other

## 2016-08-28 ENCOUNTER — Encounter (HOSPITAL_COMMUNITY): Payer: Self-pay | Admitting: *Deleted

## 2016-08-28 ENCOUNTER — Emergency Department (HOSPITAL_COMMUNITY)
Admission: EM | Admit: 2016-08-28 | Discharge: 2016-08-28 | Disposition: A | Discharge status: Home / Self Care | Payer: Medicare Other | Attending: Emergency Medicine | Admitting: Emergency Medicine

## 2016-08-28 DIAGNOSIS — R531 Weakness: Secondary | ICD-10-CM | POA: Diagnosis not present

## 2016-08-28 DIAGNOSIS — R5383 Other fatigue: Secondary | ICD-10-CM | POA: Diagnosis present

## 2016-08-28 DIAGNOSIS — Z7984 Long term (current) use of oral hypoglycemic drugs: Secondary | ICD-10-CM | POA: Insufficient documentation

## 2016-08-28 DIAGNOSIS — F17210 Nicotine dependence, cigarettes, uncomplicated: Secondary | ICD-10-CM | POA: Insufficient documentation

## 2016-08-28 DIAGNOSIS — I2510 Atherosclerotic heart disease of native coronary artery without angina pectoris: Secondary | ICD-10-CM | POA: Diagnosis not present

## 2016-08-28 DIAGNOSIS — Z794 Long term (current) use of insulin: Secondary | ICD-10-CM | POA: Diagnosis not present

## 2016-08-28 DIAGNOSIS — E119 Type 2 diabetes mellitus without complications: Secondary | ICD-10-CM | POA: Diagnosis not present

## 2016-08-28 DIAGNOSIS — I10 Essential (primary) hypertension: Secondary | ICD-10-CM | POA: Diagnosis not present

## 2016-08-28 LAB — COMPREHENSIVE METABOLIC PANEL
ALT: 16 U/L (ref 14–54)
AST: 17 U/L (ref 15–41)
Albumin: 3.3 g/dL — ABNORMAL LOW (ref 3.5–5.0)
Alkaline Phosphatase: 67 U/L (ref 38–126)
Anion gap: 7 (ref 5–15)
BUN: 9 mg/dL (ref 6–20)
CHLORIDE: 104 mmol/L (ref 101–111)
CO2: 23 mmol/L (ref 22–32)
Calcium: 8.7 mg/dL — ABNORMAL LOW (ref 8.9–10.3)
Creatinine, Ser: 0.78 mg/dL (ref 0.44–1.00)
GFR calc Af Amer: >60 mL/min (ref >60)
GFR calc non Af Amer: >60 mL/min (ref >60)
GLUCOSE: 230 mg/dL — AB (ref 65–99)
POTASSIUM: 3.5 mmol/L (ref 3.5–5.1)
SODIUM: 134 mmol/L — AB (ref 135–145)
Total Bilirubin: 0.4 mg/dL (ref 0.3–1.2)
Total Protein: 5.9 g/dL — ABNORMAL LOW (ref 6.5–8.1)

## 2016-08-28 LAB — CBC WITH DIFFERENTIAL/PLATELET
BASOS ABS: 0 10*3/uL (ref 0.0–0.1)
Basophils Relative: 0 %
EOS ABS: 0.1 10*3/uL (ref 0.0–0.7)
EOS PCT: 2 %
HCT: 35.4 % — ABNORMAL LOW (ref 36.0–46.0)
Hemoglobin: 11.8 g/dL — ABNORMAL LOW (ref 12.0–15.0)
LYMPHS ABS: 2.1 10*3/uL (ref 0.7–4.0)
LYMPHS PCT: 36 %
MCH: 27.1 pg (ref 26.0–34.0)
MCHC: 33.3 g/dL (ref 30.0–36.0)
MCV: 81.4 fL (ref 78.0–100.0)
MONO ABS: 0.3 10*3/uL (ref 0.1–1.0)
Monocytes Relative: 5 %
Neutro Abs: 3.3 10*3/uL (ref 1.7–7.7)
Neutrophils Relative %: 57 %
Platelets: 179 10*3/uL (ref 150–400)
RBC: 4.35 MIL/uL (ref 3.87–5.11)
RDW: 17.4 % — AB (ref 11.5–15.5)
WBC: 5.8 10*3/uL (ref 4.0–10.5)

## 2016-08-28 LAB — URINALYSIS, ROUTINE W REFLEX MICROSCOPIC
Bilirubin Urine: NEGATIVE
GLUCOSE, UA: 50 mg/dL — AB
Hgb urine dipstick: NEGATIVE
Ketones, ur: NEGATIVE mg/dL
LEUKOCYTES UA: NEGATIVE
Nitrite: NEGATIVE
PH: 5 (ref 5.0–8.0)
PROTEIN: NEGATIVE mg/dL
SPECIFIC GRAVITY, URINE: 1.027 (ref 1.005–1.030)

## 2016-08-28 LAB — T4, FREE: Free T4: 0.95 ng/dL (ref 0.61–1.12)

## 2016-08-28 LAB — LIPASE, BLOOD: Lipase: 22 U/L (ref 11–51)

## 2016-08-28 LAB — TSH: TSH: 1.307 u[IU]/mL (ref 0.350–4.500)

## 2016-08-28 MED ORDER — ACCU-CHEK FASTCLIX LANCETS MISC: 5 refills | Status: DC

## 2016-08-28 MED ORDER — GLUCOSE BLOOD VI STRP: ORAL_STRIP | 5 refills | Status: DC

## 2016-08-28 MED ORDER — SODIUM CHLORIDE 0.9 % IV BOLUS (SEPSIS)
1000.0000 mL | Freq: Once | INTRAVENOUS | Status: AC
  Administered 2016-08-28: 1000 mL via INTRAVENOUS

## 2016-08-28 NOTE — ED Triage Notes (Signed)
Pt is here with report of fatigue.  She states "I think I have exhaustion".  No s/s of illness but pt reports having no energy.  Pt states that she is taking all her meds including her BP and diabetes medications as prescribed but has not checked her CBG in a long time. Pt reports that she has had low iron and potassium in the past and would like that checked.  Pt is alert and oriented, no focal weakness.  No s/s of UTI, denies pregnancy, LMP 04/2016

## 2016-08-28 NOTE — ED Provider Notes (Signed)
[facility] DEPT Provider Note   CSN: [id number] Arrival date & time: 08/28/16  1259     History   Chief Complaint Chief Complaint  Patient presents with  . Fatigue    HPI [name] is a 51 y.o. female.  HPI Patient presents with concern of fatigue and weakness. She cannot specify time of onset, nor substantial changes of the past day or so. However, she notes that over the past few days she has become more weak in general, without focal area of weakness. No report of new cough, fever, chills, nausea, vomiting, diarrhea, headache. Patient was actually supposed to see her physician today, but felt too weak to go to that appointment. She states that she is generally well, though she acknowledges a history of multiple other medical issues She denies recent medication changes, diet changes, activity changes. Past Medical History:  Diagnosis Date  . Alcohol abuse   . Anemia, iron deficiency   . Arteriosclerotic cardiovascular disease (ASCVD)    Minimal at cath in [facility].stress nuclear study in 8/08 with nl EF; neg stress echo in 2010  . Community acquired pneumonia 01/03/10, 05/2010, 04/2012   2011; with pleural effusion-[facility] acute resp failure; intubated in Jan 2014 (HMPV pneumonia)  . Depression   . Diabetes mellitus, type 2 ([place]) 2000   Onset in 2000; no insulin  . Diastolic dysfunction    grade 2 per echo 2011  . Dysphagia   . Gastroesophageal reflux disease    Schatzki's ring  . History of alcohol abuse 07/22/2007   Qualifier: Diagnosis of  By: Lenn Cal    . Hyperlipidemia   . Hypertension `   during treatment with Geodon  . Obesity   . PTSD (post-traumatic stress disorder)   . Pulmonary hypertension ([place]) 05/02/2012   Patient needs repeat echo in 06/2012   . Schizoaffective disorder    requiring multiple psychiatric admissions    Patient Active Problem List   Diagnosis Date Noted  . Chronic cough 05/10/2016  . Hyperglycemia  04/02/2016  . Dysphagia   . Diabetes mellitus type II, controlled ([place]) 12/19/2014  . Burning sensation 12/15/2014  . Chest pain 12/15/2014  . Palpitations 12/15/2014  . Encounter for health education 10/24/2014  . Fatigue 10/04/2014  . Left knee pain 08/25/2014  . Vision changes 08/12/2014  . Pulsatile tinnitus 08/12/2014  . IIH (idiopathic intracranial hypertension) 08/12/2014  . Nausea with vomiting 07/26/2014  . Seasonal allergies 02/23/2014  . Flank pain, acute 01/01/2014  . Hypokalemia 12/25/2013  . Headache 12/07/2013  . Dyspepsia 11/19/2013  . Leg swelling 10/21/2013  . Diarrhea 10/14/2013  . Daytime hypersomnolence 12/09/2012  . COPD (chronic obstructive pulmonary disease) ([place]) 10/20/2012  . GERD (gastroesophageal reflux disease) 10/15/2012  . Preventative health care 10/15/2012  . Pulmonary hypertension ([place]) 05/02/2012  . Diastolic heart failure ([place]) 02/07/2012  . IDA (iron deficiency anemia) 11/06/2011  . Arteriosclerotic cardiovascular disease (ASCVD)   . TOBACCO ABUSE 09/27/2009  . Hyperlipidemia 07/22/2007  . Obesity 07/22/2007  . Bipolar 1 disorder ([place]) 07/22/2007  . Essential hypertension 07/22/2007  . Diabetes mellitus type 2, controlled ([place]) 09/28/2002    Past Surgical History:  Procedure Laterality Date  . COLONOSCOPY  01/2006   internal hemorrhoids  . COLONOSCOPY  01/10/2012   Dr. Rourk:Single anal canal hemorrhoidal tag likely source of  trivial hematochezia; right-sided colonic diverticulosis  . DILATION AND CURETTAGE, DIAGNOSTIC / THERAPEUTIC  1992  . ESOPHAGEAL DILATION N/A 08/18/2015   Procedure: ESOPHAGEAL DILATION;  Surgeon: Daneil Dolin, MD;  Location: [facility] ENDO SUITE;  Service: Endoscopy;  Laterality: N/A;  . ESOPHAGOGASTRODUODENOSCOPY  09/16/08   Dr. Trevor Iha hiatal hernia/excoriations involving the cardia and mucosa consistent with trauma, antral erosions  of linear petechiae ? gastritis versus early gastric antral vascular  ectasia.Marland Kitchen  biopsy showed reactive gastropathy. No H. pylori.  . ESOPHAGOGASTRODUODENOSCOPY  09/2007   Dr. Evalee Mutton ring, dilated to 47 French Maloney dilator, small hiatal hernia, antral erosions, biopsies reactive gastropathy.  . ESOPHAGOGASTRODUODENOSCOPY (EGD) WITH PROPOFOL N/A 12/17/2013   XBM:[id number] antral erosions and petechiae. Small hiatal hernia. No endoscopic explanation for patient's symptoms  . ESOPHAGOGASTRODUODENOSCOPY (EGD) WITH PROPOFOL N/A 08/18/2015   Procedure: ESOPHAGOGASTRODUODENOSCOPY (EGD) WITH PROPOFOL;  Surgeon: Daneil Dolin, MD;  Location: [facility] END[facility] SUITE;  Service: Endoscopy;  Laterality: N/A;  0845  . SAVORY DILATION  07/17/2011   [facility] sedation-->distal esophageal stricture s/p dilation, chronic gastritis, multiple ulcers in stomach. no h.pylori    OB History    Gravida Para Term Preterm AB Living   _0 SAB TAB Ectopic Multiple Live Births     1     2       Home Medications    Prior to Admission medications   Medication Sig Start Date End Date Taking? Authorizing Provider  acetaminophen (TYLENOL) 500 MG tablet Take 1,000 mg by mouth every 6 (six) hours as needed for mild pain.   Yes [provider]  esomeprazole (NEXIUM) 40 MG capsule Take one capsule 30 minutes before breakfast and may take additional dose before evening meal IF NEEDED. 08/14/16  Yes Mahala Menghini, PA-C  ferrous sulfate 325 (65 FE) MG tablet Take 1 tablet (325 mg total) by mouth daily. 04/03/16 04/03/17 Yes Asencion Partridge, MD  furosemide (LASIX) 20 MG tablet Take 1 tablet (20 mg total) by mouth daily. 03/27/16  Yes Holley Raring, MD  guaiFENesin-codeine 100-10 MG/5ML syrup Take 10 mLs by mouth every 6 (six) hours as needed for cough. 07/30/15  Yes Forde Dandy, MD  ibuprofen (ADVIL,MOTRIN) 200 MG tablet Take 400 mg by mouth every 6 (six) hours as needed for mild pain.   Yes [provider]  Insulin Detemir (LEVEMIR FLEXPEN) 100 UNIT/ML Pen Inject 16 Units into the  skin daily at 10 pm. 06/04/16  Yes Holley Raring, MD  loratadine (CLARITIN) 10 MG tablet TAKE ONE TABLET BY MOUTH ONCE DAILY 06/14/16  Yes Holley Raring, MD  losartan (COZAAR) 100 MG tablet Take 1 tablet (100 mg total) by mouth daily. 06/15/16  Yes Ahmed, Chesley Mires, MD  lovastatin (MEVACOR) 40 MG tablet Take 1 tablet (40 mg total) by mouth daily. 12/23/15 12/22/16 Yes Holley Raring, MD  metFORMIN (GLUCOPHAGE) 1000 MG tablet TAKE (1) TABLET BY MOUTH TWICE DAILY WITH MEALS. 10/26/15  Yes Holley Raring, MD  metoprolol (LOPRESSOR) 100 MG tablet TAKE 1 TABLET BY MOUTH TWICE DAILY. 05/25/16  Yes Holley Raring, MD  mometasone (NASONEX) 50 MCG/ACT nasal spray Place 2 sprays into the nose daily. 10/26/15  Yes Holley Raring, MD  Multiple Vitamins-Minerals (MULTIVITAMIN WITH MINERALS) tablet Take 1 tablet by mouth daily.   Yes [provider]  naproxen (NAPROSYN) 500 MG tablet Take 1 tablet (500 mg total) by mouth 2 (two) times daily. Patient taking differently: Take 500 mg by mouth 2 (two) times daily as needed.  05/08/16  Yes Rancour, Annie Main, MD  potassium chloride SA (K-DUR,KLOR-CON) 20 MEQ tablet TAKE 1 TABLET BY MOUTH DAILY. 04/23/16  Yes Holley Raring, MD  VENTOLIN HFA 108 (90 Base) MCG/ACT inhaler INHALE 2 PUFFS 3 TIMES DAILY AS NEEDED FOR WHEEZING OR SHORTNESS OF BREATH. 07/16/16  Yes Holley Raring, MD  ziprasidone (GEODON) 80 MG capsule Take 160 mg by mouth at bedtime.    Yes [provider]  ACCU-CHEK FASTCLIX LANCETS MISC Check blood sugar three times a day 08/28/16   Holley Raring, MD  benzonatate (TESSALON) 100 MG capsule Take 1 capsule (100 mg total) by mouth every 8 (eight) hours. Patient not taking: Reported on 08/28/2016 05/08/16   Ezequiel Essex, MD  glucose blood (ACCU-CHEK SMARTVIEW) test strip Check blood sugar three times a day 08/28/16   Holley Raring, MD  NOVOFINE 32G X 6 MM MISC USE TO INJECT INSULIN ONCE DAILY. 03/06/16   Holley Raring, MD  varenicline (CHANTIX CONTINUING  MONTH PAK) 1 MG tablet Take 1 tablet (1 mg total) by mouth 2 (two) times daily. Patient not taking: Reported on 08/28/2016 06/14/16   Dellia Nims, MD    Family History Family History  Problem Relation Age of Onset  . Hypertension Mother   . Stroke Father        deceased at age 51  . Colon cancer Other   . Heart disease Sister   . Diabetes Unknown   . High Cholesterol Unknown   . Arthritis Unknown   . Anesthesia problems Neg Hx   . Hypotension Neg Hx   . Malignant hyperthermia Neg Hx   . Pseudochol deficiency Neg Hx     Social History Social History  Substance Use Topics  . Smoking status: Current Every Day Smoker    Packs/day: 1.00    Years: 30.00    Types: Cigarettes    Start date: 05/18/2012  . Smokeless tobacco: Never Used     Comment: 10-15 per day  . Alcohol use No     Comment: hx of ETOH abuse     Allergies   Cephalexin; Metronidazole; Orange; Shrimp [shellfish allergy]; Penicillins; Sulfonamide derivatives; Glipizide; Ace inhibitors; and Sulfamethoxazole-trimethoprim   Review of Systems Review of Systems  Constitutional:       Per HPI, otherwise negative  HENT:       Per HPI, otherwise negative  Respiratory:       Per HPI, otherwise negative  Cardiovascular:       Per HPI, otherwise negative  Gastrointestinal: Negative for vomiting.  Endocrine:       Negative aside from HPI  Genitourinary:       Neg aside from HPI   Musculoskeletal:       Per HPI, otherwise negative  Skin: Negative.   Neurological: Negative for syncope.     Physical Exam Updated Vital Signs BP (!) 160/96 (BP Location: Left Arm)   Pulse 74   Temp 98.5 F (36.9 C) (Oral)   Resp 16   SpO2 99%   Physical Exam  Constitutional: She is oriented to person, place, and time. She appears well-developed and well-nourished. No distress.  HENT:  Head: Normocephalic and atraumatic.  Eyes: Conjunctivae and EOM are normal.  Cardiovascular: Normal rate and regular rhythm.     Pulmonary/Chest: Effort normal and breath sounds normal. No stridor. No respiratory distress.  Abdominal: She exhibits no distension.  Musculoskeletal: She exhibits no edema.  Neurological: She is alert and oriented to person, place, and time. No cranial nerve deficit.  Skin: Skin is warm and dry.  Psychiatric: She has a normal mood and affect.  Nursing note and vitals  reviewed.    ED Treatments / Results  Labs (all labs ordered are listed, but only abnormal results are displayed) Labs Reviewed  CBC WITH DIFFERENTIAL/PLATELET - Abnormal; Notable for the following:       Result Value   Hemoglobin 11.8 (*)    HCT 35.4 (*)    RDW 17.4 (*)    All other components within normal limits  COMPREHENSIVE METABOLIC PANEL - Abnormal; Notable for the following:    Sodium 134 (*)    Glucose, Bld 230 (*)    Calcium 8.7 (*)    Total Protein 5.9 (*)    Albumin 3.3 (*)    All other components within normal limits  URINALYSIS, ROUTINE W REFLEX MICROSCOPIC - Abnormal; Notable for the following:    APPearance HAZY (*)    Glucose, UA 50 (*)    All other components within normal limits  LIPASE, BLOOD  TSH  T4, FREE    Procedures Procedures (including critical care time)  Medications Ordered in ED Medications  sodium chloride 0.9 % bolus 1,000 mL (0 mLs Intravenous Stopped 08/28/16 1650)     Initial Impression / Assessment and Plan / ED Course  I have reviewed the triage vital signs and the nursing notes.  Pertinent labs & imaging results that were available during my care of the patient were reviewed by me and considered in my medical decision making (see chart for details).  Repeat exam after receiving IV fluids patient appears in similar condition, remained hemodynamically stable. No new complaints. I discussed reassuring findings, absence of substantial abnormalities, and given those, with reassuring physical exam, reassuring vitals, there is low this patient for occult infection, no  evidence for substantial abnormalities, no evidence for ongoing coronary ischemia. Patient did have several labs sent, which she will discuss with her physician, including thyroid function panel. With otherwise reassuring findings, patient discharged with return precautions, follow-up instructions.  Final Clinical Impressions(s) / ED Diagnoses   Final diagnoses:  Weakness    New Prescriptions Discharge Medication List as of 08/28/2016  5:50 PM    START taking these medications   Details  ACCU-CHEK FASTCLIX LANCETS MISC Check blood sugar three times a day, Normal    glucose blood (ACCU-CHEK SMARTVIEW) test strip Check blood sugar three times a day, Normal         Carmin Muskrat, MD 08/28/16 [phone]

## 2016-08-28 NOTE — Discharge Instructions (Signed)
As discussed, your evaluation today has been largely reassuring.  But, it is important that you monitor your condition carefully, and do not hesitate to return to the ED if you develop new, or concerning changes in your condition. ? ?Otherwise, please follow-up with your physician for appropriate ongoing care. ? ?

## 2016-08-29 ENCOUNTER — Telehealth: Payer: Self-pay

## 2016-08-29 NOTE — Telephone Encounter (Signed)
Requesting lab results from ED visit. Please call back.

## 2016-08-30 ENCOUNTER — Other Ambulatory Visit: Payer: Self-pay | Admitting: Internal Medicine

## 2016-08-30 DIAGNOSIS — I10 Essential (primary) hypertension: Secondary | ICD-10-CM

## 2016-08-30 MED ORDER — METOPROLOL TARTRATE 100 MG PO TABS: 100.0000 mg | ORAL_TABLET | Freq: Two times a day (BID) | ORAL | 0 refills | Status: DC

## 2016-08-30 MED ORDER — LOSARTAN POTASSIUM 100 MG PO TABS: 100.0000 mg | ORAL_TABLET | Freq: Every day | ORAL | 0 refills | Status: DC

## 2016-08-30 NOTE — Telephone Encounter (Signed)
I received a call from [name] to the on-call pager requesting lab results from recent ED visit. I discussed the results with her specifically normal thyroid function tests, low-normal hemoglobin, and normal electrolytes.  She was hyperglycemic at 230 with some glucosuria during this ED visit, and I counseled her that increased blood sugars could be contributing to her fatigue. She does not check CBGs at home very consistently. She thinks her control might be worse due to insulin expired from being stored unrefridgerated for long durations.  She plans to follow up with Dr. Gay Filler on 5/24 but agreed to call back to the clinic over the interval if she started feeling significantly worse before then.

## 2016-08-30 NOTE — Telephone Encounter (Signed)
WANTS REFILL ON B/P MEDICATION

## 2016-08-30 NOTE — Telephone Encounter (Signed)
Refill Request  metoprolol (LOPRESSOR) 100 MG tablet

## 2016-08-30 NOTE — Telephone Encounter (Signed)
Attempted to return call. No answer.

## 2016-09-05 ENCOUNTER — Telehealth: Payer: Self-pay | Admitting: Internal Medicine

## 2016-09-05 NOTE — Telephone Encounter (Signed)
INTERNAL MEDICINE RESIDENCY PROGRAM After-Hours Telephone Call    Reason for call:   I received a call from [name] on 09/05/2016 at 11:39 PM indicating she is concerned that her CBG is 100 after eating and she has not taken her qhs insulin yet. She is wondering if she should still take her DM meds.    Pertinent Data:   She usually takes her insulin at 10pm. She ate dinner and her post prandial sugar was 100. She is asymptomatic. She is also on metformin for her DM.    Assessment / Plan / Recommendations:   Advised to take metformin tonight and hold insulin as she has an appt in clinic tomorrow.   As always, pt is advised that if symptoms worsen or new symptoms arise, they should go to an urgent care facility or to to ER for further evaluation.    Norman Herrlich, MD   09/05/2016, 11:39 PM

## 2016-09-06 ENCOUNTER — Ambulatory Visit (INDEPENDENT_AMBULATORY_CARE_PROVIDER_SITE_OTHER): Payer: Medicare Other | Admitting: Internal Medicine

## 2016-09-06 VITALS — BP 145/70 | HR 75 | Temp 98.8°F | Ht 64.0 in | Wt 204.5 lb

## 2016-09-06 DIAGNOSIS — F17210 Nicotine dependence, cigarettes, uncomplicated: Secondary | ICD-10-CM | POA: Diagnosis not present

## 2016-09-06 DIAGNOSIS — F319 Bipolar disorder, unspecified: Secondary | ICD-10-CM | POA: Diagnosis not present

## 2016-09-06 DIAGNOSIS — Z823 Family history of stroke: Secondary | ICD-10-CM

## 2016-09-06 DIAGNOSIS — I10 Essential (primary) hypertension: Secondary | ICD-10-CM

## 2016-09-06 DIAGNOSIS — E1165 Type 2 diabetes mellitus with hyperglycemia: Secondary | ICD-10-CM | POA: Diagnosis not present

## 2016-09-06 DIAGNOSIS — I5032 Chronic diastolic (congestive) heart failure: Secondary | ICD-10-CM

## 2016-09-06 DIAGNOSIS — Z794 Long term (current) use of insulin: Secondary | ICD-10-CM

## 2016-09-06 DIAGNOSIS — Z800 Family history of malignant neoplasm of digestive organs: Secondary | ICD-10-CM

## 2016-09-06 DIAGNOSIS — Z8249 Family history of ischemic heart disease and other diseases of the circulatory system: Secondary | ICD-10-CM | POA: Diagnosis not present

## 2016-09-06 DIAGNOSIS — I110 Hypertensive heart disease with heart failure: Secondary | ICD-10-CM | POA: Diagnosis not present

## 2016-09-06 DIAGNOSIS — F17200 Nicotine dependence, unspecified, uncomplicated: Secondary | ICD-10-CM

## 2016-09-06 LAB — POCT GLYCOSYLATED HEMOGLOBIN (HGB A1C): Hemoglobin A1C: 8.2

## 2016-09-06 LAB — GLUCOSE, CAPILLARY: Glucose-Capillary: 219 mg/dL — ABNORMAL HIGH (ref 65–99)

## 2016-09-06 MED ORDER — METFORMIN HCL 1000 MG PO TABS: ORAL_TABLET | ORAL | 11 refills | Status: DC

## 2016-09-06 MED ORDER — INSULIN DETEMIR 100 UNIT/ML FLEXPEN: 18.0000 [IU] | PEN_INJECTOR | Freq: Every day | SUBCUTANEOUS | 11 refills | Status: DC

## 2016-09-06 MED ORDER — INSULIN DETEMIR 100 UNIT/ML FLEXPEN: 16.0000 [IU] | PEN_INJECTOR | Freq: Every day | SUBCUTANEOUS | 11 refills | Status: DC

## 2016-09-06 MED ORDER — METOPROLOL TARTRATE 100 MG PO TABS: 100.0000 mg | ORAL_TABLET | Freq: Two times a day (BID) | ORAL | 0 refills | Status: DC

## 2016-09-06 MED ORDER — LOSARTAN POTASSIUM 100 MG PO TABS: 100.0000 mg | ORAL_TABLET | Freq: Every day | ORAL | 0 refills | Status: DC

## 2016-09-06 MED ORDER — AMLODIPINE BESYLATE 5 MG PO TABS: 5.0000 mg | ORAL_TABLET | Freq: Every day | ORAL | 11 refills | Status: DC

## 2016-09-06 MED ORDER — INSULIN PEN NEEDLE 32G X 6 MM MISC: 3 refills | Status: DC

## 2016-09-06 MED ORDER — LOVASTATIN 40 MG PO TABS: 40.0000 mg | ORAL_TABLET | Freq: Every day | ORAL | 11 refills | Status: DC

## 2016-09-06 NOTE — Assessment & Plan Note (Addendum)
Unclear why the pt is currently on Lasix as she does not have h/o HFrEF. Last echo was 2014 which showed mild elevation in PA pressure and 2011 echo showed G2DD. She has not had any exacerbations requiring hospitalization. No pulm complaints or LE edema.  Assessment: Stable HFpEF, no indication for Lasix at this time. Concern for dehydration at last ED visit.  Plan: Trial of discontinuing Lasix and K.

## 2016-09-06 NOTE — Assessment & Plan Note (Addendum)
Recent ED visit for weakness found to have hyperglycemia to 200s. Reports that she has been taking her Levemir 16U regularly and metformin 1000mg  BID. But she also endorses drinking lots of sugary beverages. Pt did not bring her glucose log with her to clinic today. Pt denies any polydipsia/polyuria.  Recently cut back on sugary drinks and recent BG have been much improved. Last night, she did not take her insult d/t BG being 100 before bed. Advised pt to cut dose in half if BG <120 and to call clinic if BG <100 before taking insulin in the future.  Assessment: Uncontrolled t2DM w/ hyperglycemia  Plan: Check A1c - 8.2 today Continue Levemir 16U qHS, plan to titrate to BG log next visit F/u in 1 month for BG check Send referral for MNT

## 2016-09-06 NOTE — Patient Instructions (Addendum)
Continue to eat well and avoid the sugary drinks. Please take half of your insulin at night if your blood sugar is less than 120 and call the clinic if your blood sugar is less than 100 before taking your insulin.  I have started a new blood pressure medication and stopped your Lasix and potassium pills.  You should come back to clinic in 1 month to have blood pressure and blood sugar checked. We can remove your wart at that time.

## 2016-09-06 NOTE — Progress Notes (Signed)
CC: DM and HTN f/u HPI: [name] is a 51 y.o. female with a h/o of DM, HTN, alcohol abuse, CAD, GERD, PTSD, Bipolar d/o who presents for f/u of her HTN and DM.  Please see Problem-based charting for HPI and the status of patient's chronic medical conditions.  Past Medical History:  Diagnosis Date  . Alcohol abuse   . Anemia, iron deficiency   . Arteriosclerotic cardiovascular disease (ASCVD)    Minimal at cath in [facility].stress nuclear study in 8/08 with nl EF; neg stress echo in 2010  . Community acquired pneumonia 01/03/10, 05/2010, 04/2012   2011; with pleural effusion-[facility] acute resp failure; intubated in Jan 2014 (HMPV pneumonia)  . Depression   . Diabetes mellitus, type 2 ([place]) 2000   Onset in 2000; no insulin  . Diastolic dysfunction    grade 2 per echo 2011  . Dysphagia   . Gastroesophageal reflux disease    Schatzki's ring  . History of alcohol abuse 07/22/2007   Qualifier: Diagnosis of  By: Lenn Cal    . Hyperlipidemia   . Hypertension `   during treatment with Geodon  . Obesity   . PTSD (post-traumatic stress disorder)   . Pulmonary hypertension ([place]) 05/02/2012   Patient needs repeat echo in 06/2012   . Schizoaffective disorder    requiring multiple psychiatric admissions   Social History  Substance Use Topics  . Smoking status: Current Every Day Smoker    Packs/day: 1.00    Years: 30.00    Types: Cigarettes    Start date: 05/18/2012  . Smokeless tobacco: Never Used     Comment: 10-15 per day  . Alcohol use No     Comment: hx of ETOH abuse   Family History  Problem Relation Age of Onset  . Hypertension Mother   . Stroke Father        deceased at age 26  . Colon cancer Other   . Heart disease Sister   . Diabetes Unknown   . High Cholesterol Unknown   . Arthritis Unknown   . Anesthesia problems Neg Hx   . Hypotension Neg Hx   . Malignant hyperthermia Neg Hx   . Pseudochol deficiency Neg Hx    Review of  Systems: ROS in HPI. Otherwise: Review of Systems  Constitutional: Negative for chills, fever and weight loss.  Respiratory: Negative for cough and shortness of breath.   Cardiovascular: Negative for chest pain and leg swelling.  Gastrointestinal: Negative for abdominal pain, constipation, diarrhea, nausea and vomiting.  Genitourinary: Negative for dysuria, frequency and urgency.   Physical Exam: Vitals:   09/06/16 1528  BP: (!) 145/70  Pulse: 75  Temp: 98.8 F (37.1 C)  TempSrc: Oral  SpO2: 100%  Weight: 204 lb 8 oz (92.8 kg)  Height: 5\' 4"  (1.626 m)   Physical Exam  Constitutional: She appears well-developed. She is cooperative. No distress.  Cardiovascular: Normal rate, regular rhythm, normal heart sounds and normal pulses.  Exam reveals no gallop.   No murmur heard. Pulmonary/Chest: Effort normal and breath sounds normal. No respiratory distress. She has no wheezes. She has no rhonchi. She has no rales. Breasts are symmetrical.  Abdominal: Soft. Bowel sounds are normal. There is no tenderness.  Musculoskeletal: She exhibits no edema.    Assessment & Plan:  See encounters tab for problem based medical decision making. Patient discussed with Dr. Eppie Gibson  Bipolar 1 disorder Continues to follow with [facility]. Taking meds as  prescribed. Denies SE from Chantix.  Assessment: Stable Bipolar  Plan: Continue current meds. F/u with psychiatry.  Diabetes mellitus type 2, controlled ([place]) Recent ED visit for weakness found to have hyperglycemia to 200s. Reports that she has been taking her Levemir 16U regularly and metformin 1000mg  BID. But she also endorses drinking lots of sugary beverages. Pt did not bring her glucose log with her to clinic today. Pt denies any polydipsia/polyuria.  Recently cut back on sugary drinks and recent BG have been much improved. Last night, she did not take her insult d/t BG being 100 before bed. Advised pt to cut dose in half if BG <120 and to call  clinic if BG <100 before taking insulin in the future.  Assessment: Uncontrolled t2DM w/ hyperglycemia  Plan: Check A1c - 8.2 today Continue Levemir 16U qHS, plan to titrate to BG log next visit F/u in 1 month for BG check Send referral for MNT  Essential hypertension BP mildly elevated today 145/70. Recently increased losartan to 100mg . Also taking metoprolol 100mg  BID and Lasix 20mg  qD. Pt may benefit from addition of amlodipine. Denies orthostatic symptoms.  Assessment: Better control of BP recently  Plan: Consider addition of amlodipine  Diastolic heart failure Unclear why the pt is currently on Lasix as she does not have h/o HFrEF. Last echo was 2014 which showed mild elevation in PA pressure and 2011 echo showed G2DD. She has not had any exacerbations requiring hospitalization. No pulm complaints or LE edema.  Assessment: Stable HFpEF, no indication for Lasix at this time. Concern for dehydration at last ED visit.  Plan: Trial of discontinuing Lasix and K.  TOBACCO ABUSE Prescribed Chantix 06/14/16. Reports that she never got PA and didn't start. Still smoking 1 PPD.  Assessment: Tobacco abuse, unchanged. Ready to quit. Requests other assistance as she is now worried about psych effects of Chantix. Consider Bupropion  Plan: Talk with psych about Bupropion, can call this in if ok with psych.   Signed: Holley Raring, MD 09/06/2016, 4:34 PM  Pager: [phone]

## 2016-09-06 NOTE — Assessment & Plan Note (Addendum)
BP mildly elevated today 145/70. Recently increased losartan to 100mg . Also taking metoprolol 100mg  BID and Lasix 20mg  qD. Pt may benefit from addition of amlodipine. Denies orthostatic symptoms.  Assessment: Better control of BP recently  Plan: Consider addition of amlodipine

## 2016-09-06 NOTE — Assessment & Plan Note (Signed)
Continues to follow with [facility]. Taking meds as prescribed. Denies SE from Chantix.  Assessment: Stable Bipolar  Plan: Continue current meds. F/u with psychiatry.

## 2016-09-06 NOTE — Progress Notes (Signed)
Case discussed with Dr. Strelow at the time of the visit. We reviewed the resident's history and exam and pertinent patient test results. I agree with the assessment, diagnosis, and plan of care documented in the resident's note. 

## 2016-09-06 NOTE — Assessment & Plan Note (Addendum)
Prescribed Chantix 06/14/16. Reports that she never got PA and didn't start. Still smoking 1 PPD.  Assessment: Tobacco abuse, unchanged. Ready to quit. Requests other assistance as she is now worried about psych effects of Chantix. Consider Bupropion  Plan: Talk with psych about Bupropion, can call this in if ok with psych.

## 2016-09-21 ENCOUNTER — Encounter: Payer: Self-pay | Admitting: *Deleted

## 2016-09-25 DIAGNOSIS — F319 Bipolar disorder, unspecified: Secondary | ICD-10-CM | POA: Diagnosis not present

## 2016-09-25 DIAGNOSIS — Z79899 Other long term (current) drug therapy: Secondary | ICD-10-CM | POA: Diagnosis not present

## 2016-10-08 ENCOUNTER — Ambulatory Visit (INDEPENDENT_AMBULATORY_CARE_PROVIDER_SITE_OTHER): Payer: Medicare Other | Admitting: Internal Medicine

## 2016-10-08 ENCOUNTER — Ambulatory Visit: Payer: Medicare Other | Admitting: Dietician

## 2016-10-08 VITALS — BP 135/80 | HR 79 | Temp 98.6°F | Ht 64.0 in | Wt 197.1 lb

## 2016-10-08 DIAGNOSIS — F17210 Nicotine dependence, cigarettes, uncomplicated: Secondary | ICD-10-CM | POA: Diagnosis not present

## 2016-10-08 DIAGNOSIS — B079 Viral wart, unspecified: Secondary | ICD-10-CM

## 2016-10-08 HISTORY — DX: Viral wart, unspecified: B07.9

## 2016-10-08 NOTE — Progress Notes (Signed)
   CC: Wart on left 3rd finger  HPI:  [name] is a 51 y.o. here to have a wart on the 3rd finger of her left hand removed.   See problem based assessment and plan below for additional details  Past Medical History:  Diagnosis Date  . Alcohol abuse   . Anemia, iron deficiency   . Arteriosclerotic cardiovascular disease (ASCVD)    Minimal at cath in [facility].stress nuclear study in 8/08 with nl EF; neg stress echo in 2010  . Community acquired pneumonia 01/03/10, 05/2010, 04/2012   2011; with pleural effusion-[facility] acute resp failure; intubated in Jan 2014 (HMPV pneumonia)  . Depression   . Diabetes mellitus, type 2 ([place]) 2000   Onset in 2000; no insulin  . Diastolic dysfunction    grade 2 per echo 2011  . Dysphagia   . Gastroesophageal reflux disease    Schatzki's ring  . History of alcohol abuse 07/22/2007   Qualifier: Diagnosis of  By: Lenn Cal    . Hyperlipidemia   . Hypertension `   during treatment with Geodon  . Obesity   . PTSD (post-traumatic stress disorder)   . Pulmonary hypertension ([place]) 05/02/2012   Patient needs repeat echo in 06/2012   . Schizoaffective disorder    requiring multiple psychiatric admissions    Review of Systems:  Review of Systems  Skin: Negative for rash.  Neurological: Negative for sensory change.    Physical Exam: Physical Exam  Constitutional: She is well-developed, well-nourished, and in no distress.  Musculoskeletal: Normal range of motion. She exhibits no edema or deformity.  Skin: Skin is warm and dry. No rash noted.  Approximately 5-26mm diameter flat wart at the DIP joint on the palmar side of the 3rd finger of her left hand    Vitals:   10/08/16 1356  BP: 135/80  Pulse: 79  Temp: 98.6 F (37 C)  TempSrc: Oral  SpO2: 100%  Weight: 197 lb 1.6 oz (89.4 kg)  Height: 5\' 4"  (1.626 m)    Assessment & Plan:   See Encounters Tab for problem based charting.  Patient seen with Dr. Daryll Drown

## 2016-10-08 NOTE — Progress Notes (Signed)
Cryotherapy procedure note  The wart on the 3rd left hand finger was identified and informed consent obtained for cryotherapy for wart destruction. A medium sized CryoPod applicator was saturated then placed in contact with the lesion for approximately 35 seconds then allowed to fully thaw for 2 minutes. The applicator was treated and applied a second time for approximately 35 seconds. The patient tolerated the procedure well with no complications.  Attending physician Dr. Daryll Drown was present in a supervising capacity during this procedure.

## 2016-10-08 NOTE — Progress Notes (Signed)
Patient left prior to being seen today. Will call to reschedule

## 2016-10-08 NOTE — Patient Instructions (Addendum)
It was a pleasure to see you today [name].  Please let us know if your wart does not fully resolve after healing for the next 2 weeks. We can re-treat quickly for you for any leftover amount.  We will see you again as needed or for your next routine primary care visit.

## 2016-10-09 ENCOUNTER — Ambulatory Visit (INDEPENDENT_AMBULATORY_CARE_PROVIDER_SITE_OTHER): Payer: Medicare Other | Admitting: Podiatry

## 2016-10-09 ENCOUNTER — Encounter: Payer: Self-pay | Admitting: Podiatry

## 2016-10-09 VITALS — BP 129/87 | HR 86

## 2016-10-09 DIAGNOSIS — E119 Type 2 diabetes mellitus without complications: Secondary | ICD-10-CM

## 2016-10-09 DIAGNOSIS — Q828 Other specified congenital malformations of skin: Secondary | ICD-10-CM | POA: Diagnosis not present

## 2016-10-09 NOTE — Progress Notes (Signed)
This patient presents the office with chief complaint of painful calluses on the bottom of both feet.  She says she is diabetic on insulin.  She is diabetic, type II.  Her medical doctor recommended that she come to the office for an evaluation of these painful calluses on the bottom of both feet. She says her calluses are painful walking and wearing shoes  She says she had been going to the salon until they refused to work on her painful calluses.  She presents the office today for an evaluation and treatment of these painful calluses   GENERAL APPEARANCE: Alert, conversant. Appropriately groomed. No acute distress.  VASCULAR: Pedal pulses are  palpable at  [facility] and PT bilateral.  Capillary refill time is immediate to all digits,  Normal temperature gradient.  Digital hair growth is present bilateral  NEUROLOGIC: sensation is normal to 5.07 monofilament at 5/5 sites bilateral.  Light touch is intact bilateral, Muscle strength normal.  MUSCULOSKELETAL: acceptable muscle strength, tone and stability bilateral.  Intrinsic muscluature intact bilateral.  Rectus appearance of foot and digits noted bilateral.   DERMATOLOGIC: skin color, texture, and turgor are within normal limits.  No preulcerative lesions or ulcers  are seen, no interdigital maceration noted.  No open lesions present.  Digital nails are asymptomatic. No drainage noted. Porokeratotic lesions sub 5th metatarsal  B/L.   Porokeratosis sub 5th metatarsal  B/l.  Debridement of porokeratosis  B/L.  RTC 3 months.   Gardiner Barefoot DPM

## 2016-10-10 DIAGNOSIS — E113291 Type 2 diabetes mellitus with mild nonproliferative diabetic retinopathy without macular edema, right eye: Secondary | ICD-10-CM | POA: Diagnosis not present

## 2016-10-10 DIAGNOSIS — H2513 Age-related nuclear cataract, bilateral: Secondary | ICD-10-CM | POA: Diagnosis not present

## 2016-10-10 DIAGNOSIS — Z794 Long term (current) use of insulin: Secondary | ICD-10-CM | POA: Diagnosis not present

## 2016-10-10 LAB — HM DIABETES EYE EXAM

## 2016-10-10 NOTE — Assessment & Plan Note (Signed)
HPI: Simple flat wart is present on her left hand for quite a while, she does not recall the exact timing. There is no pain, discoloration, or sensory change or range of motion in the finger associated with this. She wants to have this removed because it is an annoying sensation when using the hand and unsightly.  A: Simple viral wart of the left hand  P: Treated with cryotherapy in clinic today I recommended she call back to clinic if this heals with incomplete resolution of the wart

## 2016-10-12 NOTE — Progress Notes (Signed)
Internal Medicine Clinic Attending  I saw and evaluated the patient.  I personally confirmed the key portions of the history and exam documented by Dr. Benjamine Mola and I reviewed pertinent patient test results.  The assessment, diagnosis, and plan were formulated together and I agree with the documentation in the resident's note.  I was present for procedure and discussed results with Dr. Benjamine Mola.

## 2016-11-06 NOTE — Addendum Note (Signed)
Addended by: Hulan Fray on: 11/06/2016 06:37 PM   Modules accepted: Orders

## 2016-11-23 ENCOUNTER — Other Ambulatory Visit: Payer: Self-pay

## 2016-11-23 NOTE — Telephone Encounter (Signed)
loratadine (CLARITIN) 10 MG tablet, and potassium med to be filled @ [facility] in [place].

## 2016-11-26 MED ORDER — LORATADINE 10 MG PO TABS: 10.0000 mg | ORAL_TABLET | Freq: Every day | ORAL | 3 refills | Status: DC

## 2016-11-26 NOTE — Telephone Encounter (Signed)
pls sch Acc appt Aug - to see Helberg as he is PCP. DM mgmt

## 2016-11-29 ENCOUNTER — Telehealth: Payer: Self-pay | Admitting: Internal Medicine

## 2016-11-29 NOTE — Telephone Encounter (Signed)
Pt calls and states she wants to try chantix to stop smoking, states she is tired of smoking and shes got to quit

## 2016-11-30 ENCOUNTER — Ambulatory Visit (INDEPENDENT_AMBULATORY_CARE_PROVIDER_SITE_OTHER): Payer: Medicare Other | Admitting: Internal Medicine

## 2016-11-30 VITALS — BP 149/77 | HR 74 | Temp 98.0°F | Ht 64.0 in | Wt 200.5 lb

## 2016-11-30 DIAGNOSIS — M67471 Ganglion, right ankle and foot: Secondary | ICD-10-CM | POA: Diagnosis not present

## 2016-11-30 DIAGNOSIS — B079 Viral wart, unspecified: Secondary | ICD-10-CM

## 2016-11-30 DIAGNOSIS — F17210 Nicotine dependence, cigarettes, uncomplicated: Secondary | ICD-10-CM | POA: Diagnosis not present

## 2016-11-30 NOTE — Progress Notes (Signed)
   CC: R foot lesion and L finger wart follow up   HPI:  [name] is a 51 y.o. female PMH described as below who presents for clinic for R foot lesion and L finger wart follow-up. Please see problem based assessment and plan for further details.   Past Medical History:  Diagnosis Date  . Alcohol abuse   . Anemia, iron deficiency   . Arteriosclerotic cardiovascular disease (ASCVD)    Minimal at cath in [facility].stress nuclear study in 8/08 with nl EF; neg stress echo in 2010  . Community acquired pneumonia 01/03/10, 05/2010, 04/2012   2011; with pleural effusion-[facility] acute resp failure; intubated in Jan 2014 (HMPV pneumonia)  . Depression   . Diabetes mellitus, type 2 ([place]) 2000   Onset in 2000; no insulin  . Diastolic dysfunction    grade 2 per echo 2011  . Dysphagia   . Gastroesophageal reflux disease    Schatzki's ring  . History of alcohol abuse 07/22/2007   Qualifier: Diagnosis of  By: Lenn Cal    . Hyperlipidemia   . Hypertension `   during treatment with Geodon  . Obesity   . PTSD (post-traumatic stress disorder)   . Pulmonary hypertension ([place]) 05/02/2012   Patient needs repeat echo in 06/2012   . Schizoaffective disorder    requiring multiple psychiatric admissions   Review of Systems:   Review of Systems  Constitutional: Negative for chills and fever.  Respiratory: Negative for cough and shortness of breath.   Cardiovascular: Negative for chest pain, palpitations and leg swelling.  Musculoskeletal: Negative for joint pain and myalgias.    Physical Exam:  Vitals:   11/30/16 1318  BP: (!) 149/77  Pulse: 74  Temp: 98 F (36.7 C)  TempSrc: Oral  SpO2: 100%  Weight: 200 lb 8 oz (90.9 kg)  Height: 5\' 4"  (1.626 m)   General: pleasant female, well-developed, in no acute distress  Cardiac: regular rate and rhythm, nl S1/S2, no murmurs, rubs or gallops  Pulm: CTAB, no wheezes or crackles, no increased work of breathing  Neuro:  A&Ox3, nl dorsiflexion and plantar flexion of R foot, sensation and strength intact   Ext: warm and well perfused, no peripheral edema  Derm: small nodule over mid-dorsum of R foot with no overlying skin changes       Assessment & Plan:   See Encounters Tab for problem based charting.  Patient seen with Dr. Lynnae January

## 2016-11-30 NOTE — Assessment & Plan Note (Signed)
Patient requesting cryotherapy for wart in L 3rd finger. Previously treated on 6/27. Reports wart decreased in size, but continues to bother her.   Cryotherapy procedure note  The wart on the 3rd left hand finger was identified and informed consent obtained for cryotherapy for wart destruction. A medium sized CryoPod cone was used and placed over lesion. The applicator was sprayed into the cone for 3-6 seconds and was then allowed to evaporate for 30-35 seconds. The patient tolerated the procedure well with no complications.   Attending physician Dr. Lynnae January was present in a supervising capacity during this procedure.

## 2016-11-30 NOTE — Patient Instructions (Addendum)
It was nice to meet you today, [name].  You no longer need to take potassium. Please stop taking this medication.  Follow-up with your podiatrist in 3 months for the ganglion cyst in your right foot.   Please let us know if your wart does not fully resolve after healing for the next 2 weeks.          Ganglion Cyst A ganglion cyst is a noncancerous, fluid-filled lump that occurs near joints or tendons. The ganglion cyst grows out of a joint or the lining of a tendon. It most often develops in the hand or wrist, but it can also develop in the shoulder, elbow, hip, knee, ankle, or foot. The round or oval ganglion cyst can be the size of a pea or larger than a grape. Increased activity may enlarge the size of the cyst because more fluid starts to build up. What are the causes? It is not known what causes a ganglion cyst to grow. However, it may be related to:  Inflammation or irritation around the joint.  An injury.  Repetitive movements or overuse.  Arthritis.  What increases the risk? Risk factors include:  Being a woman.  Being age 79-50.  What are the signs or symptoms? Symptoms may include:  A lump. This most often appears on the hand or wrist, but it can occur in other areas of the body.  Tingling.  Pain.  Numbness.  Muscle weakness.  Weak grip.  Less movement in a joint.  How is this diagnosed? Ganglion cysts are most often diagnosed based on a physical exam. Your health care provider will feel the lump and may shine a light alongside it. If it is a ganglion cyst, a light often shines through it. Your health care provider may order an X-ray, ultrasound, or MRI to rule out other conditions. How is this treated? Ganglion cysts usually go away on their own without treatment. If pain or other symptoms are involved, treatment may be needed. Treatment is also needed if the ganglion cyst limits your movement or if it gets infected. Treatment may  include:  Wearing a brace or splint on your wrist or finger.  Taking anti-inflammatory medicine.  Draining fluid from the lump with a needle (aspiration).  Injecting a steroid into the joint.  Surgery to remove the ganglion cyst.  Follow these instructions at home:  Do not press on the ganglion cyst, poke it with a needle, or hit it.  Take medicines only as directed by your health care provider.  Wear your brace or splint as directed by your health care provider.  Watch your ganglion cyst for any changes.  Keep all follow-up visits as directed by your health care provider. This is important. Contact a health care provider if:  Your ganglion cyst becomes larger or more painful.  You have increased redness, red streaks, or swelling.  You have pus coming from the lump.  You have weakness or numbness in the affected area.  You have a fever or chills. This information is not intended to replace advice given to you by your health care provider. Make sure you discuss any questions you have with your health care provider. Document Released: 03/30/2000 Document Revised: 09/08/2015 Document Reviewed: 09/15/2013 Elsevier Interactive Patient Education  2018 [employer].

## 2016-11-30 NOTE — Assessment & Plan Note (Signed)
Patient complaining of "bump" in the dorsum of R foot that she noticed today. States that is somewhat painful when she walks, but able to ambulate. Has not noticed increase in size. She was recently seen by podiatry on 8/13 for porokeratosis. She was instructed to follow up in 3 months. On exam, small nodule felt over dorsum of R foot with no overlying skin changes.No neurological deficits noted on R foot. Likely ganglion cyst. Explained to patient tx options, which include observation vs aspiration vs surgery. She opted for observation. She will discuss this with her podiatrist at her follow-up visit.   - Will continue to monitor

## 2016-12-03 ENCOUNTER — Telehealth: Payer: Self-pay | Admitting: *Deleted

## 2016-12-03 NOTE — Telephone Encounter (Addendum)
Information for Prior Authorization for Chantix was sent to CoverMyMeds for review.  Response within 5 days.  Sander Nephew, RN 12/03/2016 5:04 PM Denial was received and information was resubmitted for Prior Authorization review.  Will need to wait 72 hours for determination.  Sander Nephew, RN 12/14/2016 9:52 AM Chantix was approved patient was called and informed of.  [facility] was also called and informed of. [id number].   Sander Nephew, RN 12/14/2016 10:08 AM

## 2016-12-04 NOTE — Progress Notes (Signed)
Internal Medicine Clinic Attending  I saw and evaluated the patient.  I personally confirmed the key portions of the history and exam documented by Dr. Santos-Sanchez and I reviewed pertinent patient test results.  The assessment, diagnosis, and plan were formulated together and I agree with the documentation in the resident's note. 

## 2016-12-11 ENCOUNTER — Other Ambulatory Visit: Payer: Self-pay | Admitting: *Deleted

## 2016-12-11 MED ORDER — LOVASTATIN 40 MG PO TABS: 40.0000 mg | ORAL_TABLET | Freq: Every day | ORAL | 2 refills | Status: DC

## 2016-12-13 NOTE — Telephone Encounter (Signed)
Pt is calling back to check if PA on Chantix is approved by the insurance. Please call pt back.

## 2017-01-09 ENCOUNTER — Ambulatory Visit (INDEPENDENT_AMBULATORY_CARE_PROVIDER_SITE_OTHER): Payer: Medicare Other | Admitting: Internal Medicine

## 2017-01-09 ENCOUNTER — Ambulatory Visit (INDEPENDENT_AMBULATORY_CARE_PROVIDER_SITE_OTHER): Payer: Medicare Other | Admitting: Dietician

## 2017-01-09 VITALS — BP 159/81 | HR 80 | Temp 98.4°F | Ht 64.0 in | Wt 199.8 lb

## 2017-01-09 DIAGNOSIS — R350 Frequency of micturition: Secondary | ICD-10-CM

## 2017-01-09 DIAGNOSIS — E119 Type 2 diabetes mellitus without complications: Secondary | ICD-10-CM | POA: Diagnosis not present

## 2017-01-09 DIAGNOSIS — R197 Diarrhea, unspecified: Secondary | ICD-10-CM

## 2017-01-09 DIAGNOSIS — Z794 Long term (current) use of insulin: Secondary | ICD-10-CM | POA: Diagnosis not present

## 2017-01-09 DIAGNOSIS — I10 Essential (primary) hypertension: Secondary | ICD-10-CM

## 2017-01-09 DIAGNOSIS — R112 Nausea with vomiting, unspecified: Secondary | ICD-10-CM | POA: Diagnosis not present

## 2017-01-09 DIAGNOSIS — Z79899 Other long term (current) drug therapy: Secondary | ICD-10-CM | POA: Diagnosis not present

## 2017-01-09 DIAGNOSIS — J3089 Other allergic rhinitis: Secondary | ICD-10-CM

## 2017-01-09 DIAGNOSIS — F17210 Nicotine dependence, cigarettes, uncomplicated: Secondary | ICD-10-CM | POA: Diagnosis not present

## 2017-01-09 DIAGNOSIS — J302 Other seasonal allergic rhinitis: Secondary | ICD-10-CM | POA: Diagnosis not present

## 2017-01-09 DIAGNOSIS — Z720 Tobacco use: Secondary | ICD-10-CM

## 2017-01-09 DIAGNOSIS — R5383 Other fatigue: Secondary | ICD-10-CM

## 2017-01-09 DIAGNOSIS — D649 Anemia, unspecified: Secondary | ICD-10-CM

## 2017-01-09 DIAGNOSIS — E876 Hypokalemia: Secondary | ICD-10-CM

## 2017-01-09 DIAGNOSIS — E118 Type 2 diabetes mellitus with unspecified complications: Secondary | ICD-10-CM

## 2017-01-09 DIAGNOSIS — Z713 Dietary counseling and surveillance: Secondary | ICD-10-CM

## 2017-01-09 DIAGNOSIS — K529 Noninfective gastroenteritis and colitis, unspecified: Secondary | ICD-10-CM

## 2017-01-09 LAB — GLUCOSE, CAPILLARY: Glucose-Capillary: 192 mg/dL — ABNORMAL HIGH (ref 65–99)

## 2017-01-09 LAB — BASIC METABOLIC PANEL
Anion gap: 5 (ref 5–15)
BUN: 5 mg/dL — AB (ref 6–20)
CALCIUM: 9.1 mg/dL (ref 8.9–10.3)
CO2: 27 mmol/L (ref 22–32)
CREATININE: 0.82 mg/dL (ref 0.44–1.00)
Chloride: 101 mmol/L (ref 101–111)
GFR calc Af Amer: >60 mL/min (ref >60)
GLUCOSE: 175 mg/dL — AB (ref 65–99)
Potassium: 2.9 mmol/L — ABNORMAL LOW (ref 3.5–5.1)
Sodium: 133 mmol/L — ABNORMAL LOW (ref 135–145)

## 2017-01-09 LAB — CBC
HCT: 35.7 % — ABNORMAL LOW (ref 36.0–46.0)
Hemoglobin: 11.3 g/dL — ABNORMAL LOW (ref 12.0–15.0)
MCH: 25.7 pg — AB (ref 26.0–34.0)
MCHC: 31.7 g/dL (ref 30.0–36.0)
MCV: 81.3 fL (ref 78.0–100.0)
Platelets: 294 10*3/uL (ref 150–400)
RBC: 4.39 MIL/uL (ref 3.87–5.11)
RDW: 15.7 % — AB (ref 11.5–15.5)
WBC: 5.8 10*3/uL (ref 4.0–10.5)

## 2017-01-09 LAB — POCT GLYCOSYLATED HEMOGLOBIN (HGB A1C): Hemoglobin A1C: 7.3

## 2017-01-09 MED ORDER — MOMETASONE FUROATE 50 MCG/ACT NA SUSP: 2.0000 | Freq: Every day | NASAL | 3 refills | Status: DC

## 2017-01-09 MED ORDER — METOPROLOL TARTRATE 100 MG PO TABS: 100.0000 mg | ORAL_TABLET | Freq: Two times a day (BID) | ORAL | 0 refills | Status: DC

## 2017-01-09 MED ORDER — LOSARTAN POTASSIUM 100 MG PO TABS: 100.0000 mg | ORAL_TABLET | Freq: Every day | ORAL | 0 refills | Status: DC

## 2017-01-09 MED ORDER — ACCU-CHEK FASTCLIX LANCETS MISC: 5 refills | Status: DC

## 2017-01-09 MED ORDER — GLUCOSE BLOOD VI STRP: ORAL_STRIP | 5 refills | Status: DC

## 2017-01-09 MED ORDER — POTASSIUM CHLORIDE CRYS ER 20 MEQ PO TBCR: 40.0000 meq | EXTENDED_RELEASE_TABLET | Freq: Every day | ORAL | 0 refills | Status: DC

## 2017-01-09 NOTE — Patient Instructions (Addendum)
Please register your new meter.  Check your blood sugars as directed by your doctor.  Supplies are to be sent to your pharmacy.   Please call with any questions or concerns!  [name] [phone]  My plan to support myself in continuing these changes to care for my diabetes is to attend or contact:   Emotional Support ? National Alliance on Mental Illness ([place]) -(Depression, bipolar and other support) 563-055-9433; www.nami.org ? Depression & Bipolar Support Alliance- 947-654-6503-TWS.dbsalliance.org ? Anxiety &Depression Association of [place] o Find a local support groups & Therapist by zip code at https://www.clark.net/ o National phone number (279)461-7812 ? National Suicide Prevention Lifeline(602)056-9545 Weight Management ? Weight Watchers-(951) 528-5486-www. weightwatchers.com ? Over eaters Anonymous- (630)622-4469 (support group)- www.oa.org ? Other: Exercise ? Curves -203-138-8975- www.curves.com ? 24 Hour Fitness-715 684 7963- www24hourfitness.com ? Add local gym and fitness center as an option ? Other: Diabetes Support Groups ?Type 1 diabetes support group Type 2 diabetes support group : 2nd Monday of every month from 6-7 PM at [address] [facility] conference room [phone] Stress Relief ? Add local Yoga classes Other ? Add other local support resources -doctor's office, CDE, Dietitian, pharmacist, church Journals ? Diabetes Forecast- (480)143-4619- ShinInjuries.fr ? Diabetes Self-Management- 417-804-5721- www.diabetesselfmanagement.com ? Other: Apps ? Calorie Edison Pace ? Glucose Buddy (Free, tracks blood glucose, graphs) ? SparkQuote (Free, inspiring quote for the day)   Let your educator know if you need help accessing the websites.

## 2017-01-09 NOTE — Progress Notes (Signed)
   CC: Type 2 diabetes  HPI:  [name] is a 51 y.o. female with past medical history as documented below presenting for type 2 diabetes follow up. Please see encounter based charting for the HPI regarding this chronic medical condition.   The patient is also complaining today of diarrhea, fatigue, nausea, and vomiting. She states that she has been experiencing 4-5 loose stools a day intermittently for the past 2 weeks. The diarrhea is non-bloody. She also states that she has been drinking a lot of water to try and stay hydrated due to the diarrhea. She says when she drinks the water she will soon after vomit it up. The vomiting is rare and has only occurred 1-2 times in the past several weeks. She recently started Chantix, but denies other changes in diet or recent dosage changes in medications. She has no abdominal pain associated with the diarrhea or vomiting. She denies sick contacts or recent travel. She has also been experiencing fatigue and weakness for the past several weeks.   Past Medical History:  Diagnosis Date  . Alcohol abuse   . Anemia, iron deficiency   . Arteriosclerotic cardiovascular disease (ASCVD)    Minimal at cath in [facility].stress nuclear study in 8/08 with nl EF; neg stress echo in 2010  . Community acquired pneumonia 01/03/10, 05/2010, 04/2012   2011; with pleural effusion-[facility] acute resp failure; intubated in Jan 2014 (HMPV pneumonia)  . Depression   . Diabetes mellitus, type 2 ([place]) 2000   Onset in 2000; no insulin  . Diastolic dysfunction    grade 2 per echo 2011  . Dysphagia   . Gastroesophageal reflux disease    Schatzki's ring  . History of alcohol abuse 07/22/2007   Qualifier: Diagnosis of  By: Lenn Cal    . Hyperlipidemia   . Hypertension `   during treatment with Geodon  . Obesity   . PTSD (post-traumatic stress disorder)   . Pulmonary hypertension ([place]) 05/02/2012   Patient needs repeat echo in 06/2012   .  Schizoaffective disorder    requiring multiple psychiatric admissions   Review of Systems:   Review of Systems  Constitutional: Positive for malaise/fatigue. Negative for chills, fever and weight loss.  Respiratory: Negative for shortness of breath.   Cardiovascular: Negative for chest pain.  Gastrointestinal: Positive for diarrhea, nausea and vomiting. Negative for abdominal pain, blood in stool, constipation and melena.  Genitourinary: Positive for frequency. Negative for dysuria.  Neurological: Positive for weakness.     Physical Exam:  Vitals:   01/09/17 1008  BP: (!) 159/81  Pulse: 80  Temp: 98.4 F (36.9 C)  TempSrc: Oral  SpO2: 100%  Weight: 199 lb 12.8 oz (90.6 kg)  Height: 5\' 4"  (1.626 m)   General: Sititing comfortably, NAD HEENT: [place]/AT, EOMI, no scleral icterus, PERRL Cardiac: RRR, No R/M/G appreciated Pulm: normal effort, CTAB Abd: soft, non tender, non distended, BS normal Ext: extremities well perfused, no peripheral edema Neuro: alert and oriented X3, cranial nerves II-XII grossly intact   Assessment & Plan:   See Encounters Tab for problem based charting.  Patient seen with Dr. Beryle Beams

## 2017-01-09 NOTE — Progress Notes (Signed)
Diabetes Self-Management Education  Visit Type:  (P) Follow-up  Appt. Start Time: 1105 Appt. End Time: 1115  01/09/2017  [name], identified by name and date of birth, is a 51 y.o. female with a diagnosis of Diabetes:  .   ASSESSMENT  Last menstrual period 01/03/2017. There is no height or weight on file to calculate BMI.     Learning Objective:  Patient will have a greater understanding of diabetes self-management. Patient education plan is to attend individual and/or group sessions per assessed needs and concerns. My plan to support myself in continuing these changes to care for my diabetes is to attend or contact:   Emotional Support ? National Alliance on Mental Illness ([place]) -(Depression, bipolar and other support) 580-056-2646; www.nami.org ? Depression & Bipolar Support Alliance- 419-379-0240-XBD.dbsalliance.org ? Anxiety &Depression Association of [place] o Find a local support groups & Therapist by zip code at https://www.clark.net/ o National phone number 712-823-1251 ? National Suicide Prevention Lifeline(215)160-4248 Weight Management ? Weight Watchers-334 638 3307-www. weightwatchers.com ? Over eaters Anonymous- (336) 055-1163 (support group)- www.oa.org ? Other: Exercise ? Curves -217-817-3262- www.curves.com ? 24 Hour Fitness-573-386-4673- www24hourfitness.com ? Add local gym and fitness center as an option ? Other: Diabetes Support Groups ?Type 1 diabetes support group Type 2 diabetes support group : 2nd Monday of every month from 6-7 PM at [address] [facility] conference room [phone] Stress Relief ? Add local Yoga classes Other ? Add other local support resources -doctor's office, CDE, Dietitian, pharmacist, church Journals ? Diabetes Forecast- (347)336-7674- ShinInjuries.fr ? Diabetes Self-Management- 205-173-6576- www.diabetesselfmanagement.com ? Other: Apps ? Calorie Edison Pace ? Glucose Buddy (Free, tracks blood glucose,  graphs) ? SparkQuote (Free, inspiring quote for the day)   Let your educator know if you need help accessing the websites.  Plan:   There are no Patient Instructions on file for this visit.   Expected Outcomes:     Education material provided: sample meter and instructions  If problems or questions, patient to contact team via:  Phone  Future DSME appointment: -  4 months [place], [place] 01/09/2017 1:06 PM.

## 2017-01-10 ENCOUNTER — Telehealth: Payer: Self-pay | Admitting: *Deleted

## 2017-01-10 NOTE — Telephone Encounter (Signed)
-----   Message from Meriam Sprague sent at 01/10/2017 11:04 AM EDT ----- Regarding: FW: Schedule appointment Friday Not sure if anyone has called patient about her lab results.  Wanted to know before I called her with this appointment.  Please let me know.  Thanks,  Best boy ----- Message ----- From: Melanee Spry, MD Sent: 01/09/2017   1:55 PM To: Imp Front Desk Pool Subject: Schedule appointment Friday                    Patient seen in clinic today was found to have low potassium level and will need follow up labs done on Friday. Can an [facility] appointment be scheduled for her on Friday? Thank you!

## 2017-01-10 NOTE — Assessment & Plan Note (Signed)
Refilled nasonex.

## 2017-01-10 NOTE — Assessment & Plan Note (Signed)
Intermittent and seem to be associated with excessive water intake. Patient also recently started Chantix, which may be causing her nausea and vomiting.   Plan:  -Decrease fluid intake  -Monitor for symptom progression or reccurrence

## 2017-01-10 NOTE — Assessment & Plan Note (Signed)
Serum potassium 2.9, most likely contributing to patients weakness/fatigue. Possibly secondary to GI loss with recent vomiting and diarrhea. The only medicine she is currently taking that may cause hypokalemia is Chantix (~1%).   Plan: -Replete with 40 meq of K-Dur daily for 3 days -RTC on 01/11/2017 for repeat BMET

## 2017-01-10 NOTE — Assessment & Plan Note (Signed)
BP Readings from Last 3 Encounters:  01/09/17 (!) 159/81  11/30/16 (!) 149/77  10/09/16 129/87   Elevated, but patient had not taken BP meds yet. Current regimen includes Losartan 100 mg, Lopressor 100 mg, and Amlodipine 5 mg. She does not check her BP at home. Her elevated pressure is most likely due to her not taking her medications prior to the appointment.   Plan: -Continue  Losartan 100 mg, Lopressor 100 mg, and Amlodipine 5 mg -Refilled meds

## 2017-01-10 NOTE — Progress Notes (Signed)
Medicine attending: I personally interviewed and briefly examined this patient on the day of the patient visit and reviewed pertinent clinical and laboratory  with resident physician Dr. Rochele Pages and we discussed a management plan. Lady with chronic, intermittent, diarrhea followed by GI and has appt w them in 1 week. Recurrent diarrhea. Profound weakness. No abdominal pain. Benign abd exam. Drinking a lot of fluids - probably excessively so. Will will check electrolytes R/O hypokalemia/hyponatremia. Rx symptomaticaly w low dose immodium.

## 2017-01-10 NOTE — Assessment & Plan Note (Signed)
A1c 7.3 decreased from 8.2, POC glucose 192. A1C improving. Patient currently on Levemir 16 units daily and 1000 mg of metformin BID. She has recently cut down on sugary beverages. She has not been taking her BG at home because her glucometer broke several months ago. She reports several events where she felt nervous/anxious and was improved with eating, but no diaphoresis.   Plan: -Glucometer provided, refilled test strips/lancets -Check BG twice daily before breakfast and dinner; if feeling nervous or anxious, check BG to see if having hypoglycemic events -Continue with Levemir 16 units and Metformin 1000 mg BID until have more accurate BG levels  -Continue with diet modifications

## 2017-01-10 NOTE — Telephone Encounter (Signed)
Spoke with patient and made her aware of the results.  Pt scheduled for Fri 01/11/2017 @ 1315 in [facility].Despina Hidden Cassady9/27/201811:13 AM

## 2017-01-10 NOTE — Assessment & Plan Note (Signed)
Patient presenting with subacute intermittent diarrhea, with no red flag symptoms such as fever, abdominal pain, or blood in stool. After reviewing her medications it seems that the patient was prescribed Chantix at a previous clinic visit in March of 2018, but did not fill it or start taking it until recently on 12/14/2016. This is the only new medication the patient is taking. It can cause diarrhea in ~6% of patients. It more commonly causes nausea (16% to 40%) and vomiting (5% to 11%), which she is also experiencing. I believe the patient's symptoms are due to the Chantix. She can use anti-diarrheal agents for symptomatic relief. She is returning to clinic for follow up of hypokalemia on 01/11/2017 and will discuss this with her then.

## 2017-01-10 NOTE — Assessment & Plan Note (Signed)
CBC and BMET obtained in clinic. Normocytic anemia with Hgb 11.3. This seems to be around the patients baseline and I do not believe her anemia is causing her fatigue and weakness. BMET revealed serum potassium level of 2.9, and sodium level of 133. Her abnormal electrolytes and subacute diarrheal illness are most likely contributing to her fatigue. Most recent TSH in 08/2016 was within normal limits, no other signs or symptoms of hypothyroidism.

## 2017-01-11 ENCOUNTER — Ambulatory Visit (INDEPENDENT_AMBULATORY_CARE_PROVIDER_SITE_OTHER): Payer: Medicare Other | Admitting: Internal Medicine

## 2017-01-11 ENCOUNTER — Encounter: Payer: Self-pay | Admitting: Internal Medicine

## 2017-01-11 VITALS — BP 155/92 | HR 77 | Temp 98.1°F | Ht 64.0 in | Wt 202.6 lb

## 2017-01-11 DIAGNOSIS — J3089 Other allergic rhinitis: Secondary | ICD-10-CM | POA: Diagnosis not present

## 2017-01-11 DIAGNOSIS — J302 Other seasonal allergic rhinitis: Secondary | ICD-10-CM

## 2017-01-11 DIAGNOSIS — I10 Essential (primary) hypertension: Secondary | ICD-10-CM

## 2017-01-11 DIAGNOSIS — R197 Diarrhea, unspecified: Secondary | ICD-10-CM | POA: Diagnosis present

## 2017-01-11 DIAGNOSIS — E876 Hypokalemia: Secondary | ICD-10-CM | POA: Diagnosis present

## 2017-01-11 DIAGNOSIS — R5383 Other fatigue: Secondary | ICD-10-CM

## 2017-01-11 LAB — BASIC METABOLIC PANEL
ANION GAP: 5 (ref 5–15)
BUN: 5 mg/dL — ABNORMAL LOW (ref 6–20)
CALCIUM: 9 mg/dL (ref 8.9–10.3)
CO2: 25 mmol/L (ref 22–32)
CREATININE: 1.1 mg/dL — AB (ref 0.44–1.00)
Chloride: 105 mmol/L (ref 101–111)
GFR, EST NON AFRICAN AMERICAN: 58 mL/min — AB (ref >60)
GLUCOSE: 179 mg/dL — AB (ref 65–99)
Potassium: 3.2 mmol/L — ABNORMAL LOW (ref 3.5–5.1)
Sodium: 135 mmol/L (ref 135–145)

## 2017-01-11 MED ORDER — MOMETASONE FUROATE 50 MCG/ACT NA SUSP: 2.0000 | Freq: Every day | NASAL | 3 refills | Status: DC

## 2017-01-11 MED ORDER — POTASSIUM CHLORIDE CRYS ER 20 MEQ PO TBCR: 40.0000 meq | EXTENDED_RELEASE_TABLET | Freq: Every day | ORAL | 0 refills | Status: DC

## 2017-01-11 MED ORDER — LORATADINE 10 MG PO TABS: 10.0000 mg | ORAL_TABLET | Freq: Every day | ORAL | 3 refills | Status: DC

## 2017-01-11 MED FILL — POTASSIUM CL ER 20 MEQ TABL: 20 | 3 days supply | Qty: 6 | Fill #0

## 2017-01-11 NOTE — Patient Instructions (Signed)
[name],   I have increased you Amlodipine from 5 mg daily to 10 mg daily. Take two tablets daily for a total of 10 mg.

## 2017-01-11 NOTE — Progress Notes (Signed)
Internal Medicine Clinic Attending  I saw and evaluated the patient.  I personally confirmed the key portions of the history and exam documented by Dr. Shan Levans and I reviewed pertinent patient test results.  The assessment, diagnosis, and plan were formulated together and I agree with the documentation in the resident's note.  Diarrhea seems to be improving overall, no red flag signs. She can try immodium prn, and if diarrhea persists, we can pursue evaluation for other etiologies. Hypokalemia is improving with supplementation, should follow up soon to have it rechecked.   Oda Kilts, MD

## 2017-01-11 NOTE — Progress Notes (Signed)
   CC: hypokalemia, diarrhea  HPI:  [name] is a 51 y.o. female with past medical history as documented below presenting for follow up of hypokalemia and generalized weakness. She states after taking three days of 40 meq of potassium supplementation she feels much better and less weak. In regards to the patients diarrhea, she started Chantix six days ago and was having diarrhea prior to initiating the medication. She continues to deny fever, chills, abdominal pain, or blood or mucous in stool. Her diarrhea continues to be intermittent. She had one episode of diarrhea this morning, but did not experience any diarrhea the days after her previous visit.   Past Medical History:  Diagnosis Date  . Alcohol abuse   . Anemia, iron deficiency   . Arteriosclerotic cardiovascular disease (ASCVD)    Minimal at cath in [facility].stress nuclear study in 8/08 with nl EF; neg stress echo in 2010  . Community acquired pneumonia 01/03/10, 05/2010, 04/2012   2011; with pleural effusion-[facility] acute resp failure; intubated in Jan 2014 (HMPV pneumonia)  . Depression   . Diabetes mellitus, type 2 ([place]) 2000   Onset in 2000; no insulin  . Diastolic dysfunction    grade 2 per echo 2011  . Dysphagia   . Gastroesophageal reflux disease    Schatzki's ring  . History of alcohol abuse 07/22/2007   Qualifier: Diagnosis of  By: Lenn Cal    . Hyperlipidemia   . Hypertension `   during treatment with Geodon  . Obesity   . PTSD (post-traumatic stress disorder)   . Pulmonary hypertension ([place]) 05/02/2012   Patient needs repeat echo in 06/2012   . Schizoaffective disorder    requiring multiple psychiatric admissions   Review of Systems:   \Review of Systems  Constitutional: Negative for chills, fever and weight loss.  Respiratory: Negative for shortness of breath.   Cardiovascular: Negative for chest pain.  Gastrointestinal: Positive for diarrhea. Negative for abdominal pain, blood in  stool, melena, nausea and vomiting.    Physical Exam:  Vitals:   01/11/17 1319  BP: (!) 169/87  Pulse: 75  Temp: 98.1 F (36.7 C)  TempSrc: Oral  SpO2: 100%  Weight: 202 lb 9.6 oz (91.9 kg)  Height: 5\' 4"  (1.626 m)   General: Sitting comfortably, NAD HEENT: [place]/AT, EOMI, no scleral icterus, mucous membranes moist Cardiac: RRR, No R/M/G appreciated Pulm: normal effort, CTAB Abd: soft, non tender, non distended, BS normal Ext: extremities well perfused, no peripheral edema Neuro: alert and oriented X3, cranial nerves II-XII grossly intact   Assessment & Plan:   See Encounters Tab for problem based charting.  Patient seen with Dr. Rebeca Alert

## 2017-01-11 NOTE — Assessment & Plan Note (Addendum)
BP Readings from Last 3 Encounters:  01/11/17 (!) 155/92  01/09/17 (!) 159/81  11/30/16 (!) 149/77   Patient had taken all of her BP medications and her pressure was still elevated. Will increase Amlodipine to 10 mg daily. Consider addition of HCTZ if BP remains uncontrolled and once hypokalemia resolves. Return to clinic in 3-4 weeks for BP check.

## 2017-01-11 NOTE — Assessment & Plan Note (Signed)
Fatigue and weakness resolving with normalization of patient's serum potassium and sodium levels.

## 2017-01-11 NOTE — Assessment & Plan Note (Signed)
Diarrhea seems to be improving and is becoming less frequent. This is most likely self-limiting. She continues to have no red flag symptoms and is hemodynamically stable. It is unlikely her diarrhea is due to the Chantix because she was having diarrhea prior to starting the medication.   Plan: Imodium or other OTC anti-diarrheal for symptomatic relief Continue to monitor

## 2017-01-11 NOTE — Assessment & Plan Note (Signed)
Refilled Loratadine and Nasonex to [facility] per pt request.

## 2017-01-11 NOTE — Assessment & Plan Note (Signed)
Serum potassium level improved to 3.2 after 3 days of 40 meq of Kdur. Weakness/fatigue is also improved.   Plan: -Replete with 40 meq of K-Dur daily for 3 more days

## 2017-01-14 ENCOUNTER — Ambulatory Visit: Payer: Medicare Other

## 2017-01-15 ENCOUNTER — Ambulatory Visit: Payer: Medicare Other

## 2017-01-17 DIAGNOSIS — F17200 Nicotine dependence, unspecified, uncomplicated: Secondary | ICD-10-CM | POA: Diagnosis not present

## 2017-02-05 ENCOUNTER — Other Ambulatory Visit: Payer: Self-pay | Admitting: Gastroenterology

## 2017-02-06 ENCOUNTER — Encounter: Payer: Medicare Other | Admitting: Internal Medicine

## 2017-02-12 ENCOUNTER — Ambulatory Visit (INDEPENDENT_AMBULATORY_CARE_PROVIDER_SITE_OTHER): Payer: Medicare Other | Admitting: Podiatry

## 2017-02-12 ENCOUNTER — Encounter: Payer: Self-pay | Admitting: Podiatry

## 2017-02-12 DIAGNOSIS — Q828 Other specified congenital malformations of skin: Secondary | ICD-10-CM

## 2017-02-12 DIAGNOSIS — E119 Type 2 diabetes mellitus without complications: Secondary | ICD-10-CM | POA: Diagnosis not present

## 2017-02-12 NOTE — Progress Notes (Addendum)
This patient presents the office with chief complaint of calluses on the bottom of the outside of both feet. She is a diabetic type II. She says the calluses are painful walking and wearing his shoes. She says she has her nails done at the salon, but they do not work on her calluses. She presents today for the office for an evaluation and treatment of her painful callus.  General Appearance  Alert, conversant and in no acute stress.  Vascular  Dorsalis pedis and posterior pulses are palpable  bilaterally.  Capillary return is within normal limits  Bilaterally. Temperature is within normal limits  Bilaterally  Neurologic  Senn-Weinstein monofilament wire test within normal limits  bilaterally. Muscle power  Within normal limits bilaterally.  Nails Thick disfigured discolored nails with subungual debride bilaterally from hallux to fifth toes bilaterally. No evidence of bacterial infection or drainage bilaterally.  Orthopedic  No limitations of motion of motion feet bilaterally.  No crepitus or effusions noted.  No bony pathology or digital deformities noted.  Skin  normotropic skin  bilaterally.  No signs of infections or ulcers noted.  Porokeratosis  Sub 5  B/L.  Porokeratosis  B/L  ROV.  The priming of porokeratosis with a #15 blade.  RTC 4 months. ABN signed for 2018.   Gardiner Barefoot DPM

## 2017-02-14 ENCOUNTER — Ambulatory Visit (INDEPENDENT_AMBULATORY_CARE_PROVIDER_SITE_OTHER): Payer: Medicare Other | Admitting: Internal Medicine

## 2017-02-14 VITALS — BP 170/82 | HR 80 | Temp 98.5°F | Ht 64.0 in | Wt 200.5 lb

## 2017-02-14 DIAGNOSIS — Z23 Encounter for immunization: Secondary | ICD-10-CM

## 2017-02-14 DIAGNOSIS — Z79899 Other long term (current) drug therapy: Secondary | ICD-10-CM | POA: Diagnosis not present

## 2017-02-14 DIAGNOSIS — B079 Viral wart, unspecified: Secondary | ICD-10-CM | POA: Diagnosis not present

## 2017-02-14 DIAGNOSIS — I10 Essential (primary) hypertension: Secondary | ICD-10-CM | POA: Diagnosis not present

## 2017-02-14 DIAGNOSIS — E876 Hypokalemia: Secondary | ICD-10-CM

## 2017-02-14 DIAGNOSIS — R197 Diarrhea, unspecified: Secondary | ICD-10-CM | POA: Diagnosis not present

## 2017-02-14 DIAGNOSIS — F17210 Nicotine dependence, cigarettes, uncomplicated: Secondary | ICD-10-CM | POA: Diagnosis not present

## 2017-02-14 MED ORDER — SPIRONOLACTONE 50 MG PO TABS: 50.0000 mg | ORAL_TABLET | Freq: Every day | ORAL | 0 refills | Status: DC

## 2017-02-14 MED ORDER — AMLODIPINE BESYLATE 10 MG PO TABS: 5.0000 mg | ORAL_TABLET | Freq: Every day | ORAL | 0 refills | Status: DC

## 2017-02-14 NOTE — Progress Notes (Signed)
   CC: Lab work for hypokalemia and HTN follow up  HPI:  [name] is a 51 y.o. female with a past medical history listed below here today requesting lab work for her hypokalemia  For details of today's visit and the status of her chronic medical issues please refer to the assessment and plan.  Past Medical History:  Diagnosis Date  . Alcohol abuse   . Anemia, iron deficiency   . Arteriosclerotic cardiovascular disease (ASCVD)    Minimal at cath in [facility].stress nuclear study in 8/08 with nl EF; neg stress echo in 2010  . Community acquired pneumonia 01/03/10, 05/2010, 04/2012   2011; with pleural effusion-[facility] acute resp failure; intubated in Jan 2014 (HMPV pneumonia)  . Depression   . Diabetes mellitus, type 2 ([place]) 2000   Onset in 2000; no insulin  . Diastolic dysfunction    grade 2 per echo 2011  . Dysphagia   . Gastroesophageal reflux disease    Schatzki's ring  . History of alcohol abuse 07/22/2007   Qualifier: Diagnosis of  By: Lenn Cal    . Hyperlipidemia   . Hypertension `   during treatment with Geodon  . Obesity   . PTSD (post-traumatic stress disorder)   . Pulmonary hypertension ([place]) 05/02/2012   Patient needs repeat echo in 06/2012   . Schizoaffective disorder    requiring multiple psychiatric admissions   Review of Systems:   No chest pain or shortness of breath  Physical Exam:  Vitals:   02/14/17 1534  BP: (!) 170/82  Pulse: 80  Temp: 98.5 F (36.9 C)  TempSrc: Oral  SpO2: 100%  Weight: 200 lb 8 oz (90.9 kg)  Height: 5\' 4"  (1.626 m)   GENERAL- alert, co-operative, appears as stated age, not in any distress. CARDIAC- RRR, no murmurs, rubs or gallops. RESP- Moving equal volumes of air, and clear to auscultation bilaterally, no wheezes or crackles. ABDOMEN- Soft, nontender, bowel sounds present. EXTREMITIES- pulse 2+, symmetric, no pedal edema. SKIN- Warm, dry, No rash or lesion. PSYCH- Normal mood and affect,  appropriate thought content and speech.   Assessment & Plan:   See Encounters Tab for problem based charting.  Patient discussed with Dr. Beryle Beams

## 2017-02-14 NOTE — Patient Instructions (Signed)
Ms. Belfield,  I am going to start you on a new blood pressure medication called spironolactone. Please take this daily.  Follow up in 2 weeks for a lab re-check and in a month for a visit for your blood pressure.

## 2017-02-15 LAB — BMP8+ANION GAP
Anion Gap: 12 mmol/L (ref 10.0–18.0)
BUN / CREAT RATIO: 8 — AB (ref 9–23)
BUN: 6 mg/dL (ref 6–24)
CHLORIDE: 100 mmol/L (ref 96–106)
CO2: 24 mmol/L (ref 20–29)
Calcium: 9.3 mg/dL (ref 8.7–10.2)
Creatinine, Ser: 0.71 mg/dL (ref 0.57–1.00)
GFR calc non Af Amer: 100 mL/min/{1.73_m2} (ref >59)
GFR, EST AFRICAN AMERICAN: 116 mL/min/{1.73_m2} (ref >59)
GLUCOSE: 164 mg/dL — AB (ref 65–99)
POTASSIUM: 3.8 mmol/L (ref 3.5–5.2)
Sodium: 136 mmol/L (ref 134–144)

## 2017-02-15 LAB — URINALYSIS, COMPLETE
Bilirubin, UA: NEGATIVE
LEUKOCYTES UA: NEGATIVE
NITRITE UA: NEGATIVE
PH UA: 5.5 (ref 5.0–7.5)
RBC, UA: NEGATIVE
Specific Gravity, UA: 1.027 (ref 1.005–1.030)
Urobilinogen, Ur: 0.2 mg/dL (ref 0.2–1.0)

## 2017-02-15 LAB — MICROSCOPIC EXAMINATION: CASTS: NONE SEEN /LPF

## 2017-02-16 NOTE — Assessment & Plan Note (Signed)
Patient requesting additional cryotherapy for wart on L 3rd finger. Has had 2 treatments previously. Reports gradual improvement from previous treatments.  The wart on the 3rd left hand finger was identified and informed consent obtained for cryotherapy for wart destruction. A medium sized CryoPod cone was used and placed over lesion. The applicator was sprayed into the cone for 3-6 seconds and was then allowed to evaporate for 30-35 seconds. The patient tolerated the procedure well with no complications.   Attending physician Dr. Evette Doffing was present in a supervising capacity during this procedure

## 2017-02-16 NOTE — Assessment & Plan Note (Signed)
BP Readings from Last 3 Encounters:  02/14/17 (!) 170/82  01/11/17 (!) 155/92  01/09/17 (!) 159/81    Lab Results  Component Value Date   NA 136 02/14/2017   K 3.8 02/14/2017   CREATININE 0.71 02/14/2017   BP remains uncontrolled today. Currently prescribed amlodipine 10 mg daily, Lopressor 100 mg daily, and Lopressor 100 mg daily. Reports compliance with her medications.   Assessment: Resistant hypertension  Plan: Start spironolactone 25 mg daily BMET today > hypoK has resolved UA today without evidence of RTA

## 2017-02-16 NOTE — Assessment & Plan Note (Signed)
BMET today with resolution of hypokK. Has nott been taking any K supplementation.

## 2017-02-16 NOTE — Assessment & Plan Note (Signed)
Reports diarrhea is worsening. Only having 2 loose bowel movement daily. No nausea or vomiting. Would not account for any hypokalemia from GI losses. Does not wake her up at night. Does not note any correlation to meals or certain foods.  A/P Only having 2 loose stools daily. Advised to keep a food journal. Imodium prn.

## 2017-02-17 NOTE — Progress Notes (Signed)
Medicine attending: I personally interviewed and briefly examined this patient on the day of the patient visit and reviewed pertinent clinical and laboratory data  with resident physician Dr. Maryellen Pile and we discussed a management plan.

## 2017-03-09 ENCOUNTER — Encounter: Payer: Self-pay | Admitting: *Deleted

## 2017-03-28 ENCOUNTER — Encounter: Payer: Self-pay | Admitting: Nurse Practitioner

## 2017-03-28 ENCOUNTER — Ambulatory Visit (INDEPENDENT_AMBULATORY_CARE_PROVIDER_SITE_OTHER): Payer: Medicare Other | Admitting: Nurse Practitioner

## 2017-03-28 VITALS — BP 147/92 | HR 78 | Temp 98.2°F | Ht 64.0 in | Wt 199.6 lb

## 2017-03-28 DIAGNOSIS — R197 Diarrhea, unspecified: Secondary | ICD-10-CM

## 2017-03-28 DIAGNOSIS — R1030 Lower abdominal pain, unspecified: Secondary | ICD-10-CM

## 2017-03-28 DIAGNOSIS — R109 Unspecified abdominal pain: Secondary | ICD-10-CM | POA: Insufficient documentation

## 2017-03-28 NOTE — Patient Instructions (Signed)
1. Collect your stool studies. 2. Bring your stool samples to the lab, not our office. 3. Have your blood work drawn when you are able to. 4. Return for follow-up in 2 months. 5. Call us if you have any questions or concerns.

## 2017-03-28 NOTE — Assessment & Plan Note (Signed)
The patient began having diarrhea about 6 months ago.  This started 1 month after starting Levemir insulin, she previously took Lantus.  She feels her insulin may be contributing and is switching back to Lantus at the first of the year.  She has been seen for diarrhea years before.  It appears to be a cyclical pattern.  She has had a colonoscopy within 5 years which was essentially normal with no polyps or other tissue abnormalities noted.  At this point I will check a CBC, CMP, stool studies, pancreatic fecal elastase.  She has no infection we can try symptomatic treatments for irritable bowel syndrome diarrhea type.  She does have abdominal pain with her loose stools.  Return for follow-up in 2 months.  No red flags or warning signs or symptoms currently.

## 2017-03-28 NOTE — Assessment & Plan Note (Addendum)
Patient describes lower abdomen crampy pain which is intermittent.  Appears some improvement with bowel movement.  No overt pain on exam.  Overall, she could have irritable bowel syndrome diarrhea type.  However, we need to rule out occult infection and other etiologies such as exocrine pancreatic insufficiency first.  Further workup as per below.  Return for follow-up in 2 months.

## 2017-03-28 NOTE — Progress Notes (Signed)
Referring Provider: Ina Homes, MD Primary Care Physician:  Ina Homes, MD Primary GI:  Dr. Gala Romney  Chief Complaint  Patient presents with  . Diarrhea    HPI:   [name] is a 51 y.o. female who presents with complaints of diarrhea.  The patient was last seen in our office 11/22/2015 for post procedure follow-up and GERD.  She was recently in the emergency department around that time for diarrhea with 3-4 watery stools a day, mild crampy abdominal pain that was intermittent as well as nausea.  She was discharged with Zofran and Ultram as her symptoms were deemed likely viral illness.  EGD completed 08/18/2015 which found normal esophagus status post dilation, normal stomach, normal second portion of the duodenum. Recommended advance diet, continue current medications including Nexium 40 mg twice daily, return for follow-up in 3 months.  At her last visit she was doing well until the prior weekend when she had fish with too much fiber which caused upset stomach and symptoms were noted to be calming down.  Recommended twice a day Nexium, avoid trigger foods, follow-up in 6 months.  Follow-up appointment did not happen.  Today she states she's doing ok overall. Started having diarrhea about 6 months ago. Her diarrhea started about 1 month after starting a new insulin. She is also on Metformin (>10 years). Has about 2-3 watery/loose stools a day "no solidness to them." Denies hematochezia, melena. Has intermittent lower abdominal pain described as crampy ("I've been mistaking it for my menstrual cycle but it's not.") Occasional/rare N/V but not in a few weeks. Denies fever, chills, unintentional weight loss. Denies chest pain, dyspnea, dizziness, lightheadedness, syncope, near syncope. Denies any other upper or lower GI symptoms.  Of note she will be changing her insulin back to what she used to take (Lantus) at the start of the new year. No new/increased major stressors in her  life.  Last TCS completed 01/10/2012 which found hemorrhoid tag, scattered ascending colon diverticula, otherwise normal.   Past Medical History:  Diagnosis Date  . Alcohol abuse   . Anemia, iron deficiency   . Arteriosclerotic cardiovascular disease (ASCVD)    Minimal at cath in [facility].stress nuclear study in 8/08 with nl EF; neg stress echo in 2010  . Community acquired pneumonia 01/03/10, 05/2010, 04/2012   2011; with pleural effusion-[facility] acute resp failure; intubated in Jan 2014 (HMPV pneumonia)  . Depression   . Diabetes mellitus, type 2 ([place]) 2000   Onset in 2000; no insulin  . Diastolic dysfunction    grade 2 per echo 2011  . Dysphagia   . Gastroesophageal reflux disease    Schatzki's ring  . History of alcohol abuse 07/22/2007   Qualifier: Diagnosis of  By: Lenn Cal    . Hyperlipidemia   . Hypertension `   during treatment with Geodon  . Obesity   . PTSD (post-traumatic stress disorder)   . Pulmonary hypertension ([place]) 05/02/2012   Patient needs repeat echo in 06/2012   . Schizoaffective disorder    requiring multiple psychiatric admissions    Past Surgical History:  Procedure Laterality Date  . COLONOSCOPY  01/2006   internal hemorrhoids  . COLONOSCOPY  01/10/2012   Dr. Rourk:Single anal canal hemorrhoidal tag likely source of  trivial hematochezia; right-sided colonic diverticulosis  . DILATION AND CURETTAGE, DIAGNOSTIC / THERAPEUTIC  1992  . ESOPHAGEAL DILATION N/A 08/18/2015   Procedure: ESOPHAGEAL DILATION;  Surgeon: Daneil Dolin, MD;  Location: [facility]  ENDO SUITE;  Service: Endoscopy;  Laterality: N/A;  . ESOPHAGOGASTRODUODENOSCOPY  09/16/08   Dr. Trevor Iha hiatal hernia/excoriations involving the cardia and mucosa consistent with trauma, antral erosions  of linear petechiae ? gastritis versus early gastric antral vascular  ectasia.Marland Kitchen biopsy showed reactive gastropathy. No H. pylori.  . ESOPHAGOGASTRODUODENOSCOPY  09/2007   Dr. Evalee Mutton  ring, dilated to 44 French Maloney dilator, small hiatal hernia, antral erosions, biopsies reactive gastropathy.  . ESOPHAGOGASTRODUODENOSCOPY (EGD) WITH PROPOFOL N/A 12/17/2013   DJT:[id number] antral erosions and petechiae. Small hiatal hernia. No endoscopic explanation for patient's symptoms  . ESOPHAGOGASTRODUODENOSCOPY (EGD) WITH PROPOFOL N/A 08/18/2015   Procedure: ESOPHAGOGASTRODUODENOSCOPY (EGD) WITH PROPOFOL;  Surgeon: Daneil Dolin, MD;  Location: [facility] ENDO [facility];  Service: Endoscopy;  Laterality: N/A;  0845  . SAVORY DILATION  07/17/2011   Fields-MAC sedation-->distal esophageal stricture s/p dilation, chronic gastritis, multiple ulcers in stomach. no h.pylori    Current Outpatient Medications  Medication Sig Dispense Refill  . ACCU-CHEK FASTCLIX LANCETS MISC Check blood sugar three times a day 102 each 5  . acetaminophen (TYLENOL) 500 MG tablet Take 1,000 mg by mouth every 6 (six) hours as needed for mild pain.    Marland Kitchen amLODipine (NORVASC) 10 MG tablet Take 0.5 tablets (5 mg total) by mouth daily. (Patient taking differently: Take 10 mg by mouth daily. ) 90 tablet 0  . esomeprazole (NEXIUM) 40 MG capsule TAKE ONE CAPSULE BY MOUTH 30 MINUTES BEFORE BREAKFAST AND MAY TAKE ADDITIONAL DOSE BEFORE EVENING MEAL IF NEEDED. 60 capsule 3  . glucose blood (ACCU-CHEK SMARTVIEW) test strip Check blood sugar three times a day 100 each 5  . ibuprofen (ADVIL,MOTRIN) 200 MG tablet Take 400 mg by mouth every 6 (six) hours as needed for mild pain.    . Insulin Detemir (LEVEMIR FLEXPEN) 100 UNIT/ML Pen Inject 16 Units into the skin daily at 10 pm. 15 mL 11  . Insulin Pen Needle (NOVOFINE) 32G X 6 MM MISC USE TO INJECT INSULIN ONCE DAILY. 100 each 3  . loratadine (CLARITIN) 10 MG tablet Take 1 tablet (10 mg total) by mouth daily. 90 tablet 3  . losartan (COZAAR) 100 MG tablet Take 1 tablet (100 mg total) by mouth daily. 90 tablet 0  . lovastatin (MEVACOR) 40 MG tablet Take 1 tablet (40 mg total) by mouth daily. 30  tablet 2  . metFORMIN (GLUCOPHAGE) 1000 MG tablet TAKE (1) TABLET BY MOUTH TWICE DAILY WITH MEALS. 60 tablet 11  . metoprolol tartrate (LOPRESSOR) 100 MG tablet Take 1 tablet (100 mg total) by mouth 2 (two) times daily. 180 tablet 0  . mometasone (NASONEX) 50 MCG/ACT nasal spray Place 2 sprays into the nose daily. 17 g 3  . Multiple Vitamins-Minerals (MULTIVITAMIN WITH MINERALS) tablet Take 1 tablet by mouth daily.    Marland Kitchen spironolactone (ALDACTONE) 50 MG tablet Take 1 tablet (50 mg total) by mouth daily. 60 tablet 0  . VENTOLIN HFA 108 (90 Base) MCG/ACT inhaler INHALE 2 PUFFS 3 TIMES DAILY AS NEEDED FOR WHEEZING OR SHORTNESS OF BREATH. 36 g 0  . ziprasidone (GEODON) 80 MG capsule Take 160 mg by mouth at bedtime.     . benzonatate (TESSALON) 100 MG capsule Take 1 capsule (100 mg total) by mouth every 8 (eight) hours. (Patient not taking: Reported on 03/28/2017) 21 capsule 0  . CHANTIX 1 MG tablet     . ferrous sulfate 325 (65 FE) MG tablet Take 1 tablet (325 mg total) by mouth daily. (Patient not taking: Reported  on 03/28/2017) 30 tablet 3  . guaiFENesin-codeine 100-10 MG/5ML syrup Take 10 mLs by mouth every 6 (six) hours as needed for cough. (Patient not taking: Reported on 03/28/2017) 120 mL 0  . naproxen (NAPROSYN) 500 MG tablet Take 1 tablet (500 mg total) by mouth 2 (two) times daily. (Patient not taking: Reported on 03/28/2017) 30 tablet 0  . potassium chloride SA (K-DUR,KLOR-CON) 20 MEQ tablet Take 2 tablets (40 mEq total) by mouth daily. (Patient not taking: Reported on 03/28/2017) 6 tablet 0   No current facility-administered medications for this visit.     Allergies as of 03/28/2017 - Review Complete 03/28/2017  Allergen Reaction Noted  . Cephalexin  10/14/2013  . Metronidazole Shortness Of Breath and Swelling   . Orange Itching 03/27/2011  . Shrimp [shellfish allergy] Shortness Of Breath and Itching 03/27/2011  . Penicillins Hives and Swelling   . Sulfonamide derivatives Hives   .  Glipizide Other (See Comments) 01/15/2011  . Ace inhibitors Cough 06/14/2016  . Sulfamethoxazole-trimethoprim Rash 02/27/2010    Family History  Problem Relation Age of Onset  . Hypertension Mother   . Stroke Father        deceased at age 86  . Colon cancer Other   . Heart disease Sister   . Diabetes Unknown   . High Cholesterol Unknown   . Arthritis Unknown   . Anesthesia problems Neg Hx   . Hypotension Neg Hx   . Malignant hyperthermia Neg Hx   . Pseudochol deficiency Neg Hx     Social History   Socioeconomic History  . Marital status: Single    Spouse name: None  . Number of children: 2  . Years of education: some colge  . Highest education level: None  Social Needs  . Financial resource strain: None  . Food insecurity - worry: None  . Food insecurity - inability: None  . Transportation needs - medical: None  . Transportation needs - non-medical: None  Occupational History  . Occupation: Disability  . Occupation: UNEMPLOYED    Employer: UNEMPLOYED  Tobacco Use  . Smoking status: Current Every Day Smoker    Packs/day: 1.00    Years: 30.00    Pack years: 30.00    Types: Cigarettes    Start date: 05/18/2012  . Smokeless tobacco: Never Used  . Tobacco comment: 10-15 per day.Stopped Chantix   Substance and Sexual Activity  . Alcohol use: No    Alcohol/week: 0.0 oz    Comment: hx of ETOH abuse  . Drug use: No  . Sexual activity: No    Partners: Male    Birth control/protection: None  Other Topics Concern  . 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 ENT: Negative for hoarseness, difficulty swallowing ,  nasal congestion. CV: Negative for chest pain, angina, palpitations, peripheral edema.  Respiratory: Negative for dyspnea at rest, cough, sputum, wheezing.  GI: See history of present illness. Derm: Negative for rash or itching.  Endo: Negative for unusual weight change.  Heme: Negative for bruising or bleeding. Allergy: Negative for rash or hives.   Physical Exam: BP (!) 147/92   Pulse 78  Temp 98.2 F (36.8 C) (Oral)   Ht _0  (1.626 m)   Wt 199 lb 9.6 oz (90.5 kg)   BMI 34.26 kg/m  General:   Alert and oriented. Pleasant and cooperative. Well-nourished and well-developed.  Eyes:  Without icterus, sclera clear and conjunctiva pink.  Ears:  Normal auditory acuity. Cardiovascular:  S1, S2 present without murmurs appreciated. Extremities without clubbing or edema. Respiratory:  Clear to auscultation bilaterally. No wheezes, rales, or rhonchi. No distress.  Gastrointestinal:  +BS, soft, non-tender and non-distended. No HSM noted. No guarding or rebound. No masses appreciated.  Rectal:  Deferred  Musculoskalatal:  Symmetrical without gross deformities. Neurologic:  Alert and oriented x4;  grossly normal neurologically. Psych:  Alert and cooperative. Normal mood and affect. Heme/Lymph/Immune: No excessive bruising noted.    03/28/2017 8:37 AM   Disclaimer: This note was dictated with voice recognition software. Similar sounding words can inadvertently be transcribed and may not be corrected upon review.

## 2017-03-28 NOTE — Progress Notes (Signed)
CC'D TO PCP °

## 2017-03-29 DIAGNOSIS — R197 Diarrhea, unspecified: Secondary | ICD-10-CM | POA: Diagnosis not present

## 2017-03-29 DIAGNOSIS — R1030 Lower abdominal pain, unspecified: Secondary | ICD-10-CM | POA: Diagnosis not present

## 2017-04-02 ENCOUNTER — Telehealth: Payer: Self-pay | Admitting: Internal Medicine

## 2017-04-02 NOTE — Telephone Encounter (Signed)
[phone]  PLEASE CALL PATIENT IF HER STOOL AND LAB RESULTS ARE IN

## 2017-04-02 NOTE — Telephone Encounter (Signed)
Spoke with pt. Stool test isn't in. Pt will receive a call when results come in.

## 2017-04-03 LAB — CBC WITH DIFFERENTIAL/PLATELET
Basophils Absolute: 30 cells/uL (ref 0–200)
Basophils Relative: 0.5 %
EOS ABS: 171 {cells}/uL (ref 15–500)
Eosinophils Relative: 2.9 %
HCT: 38.1 % (ref 35.0–45.0)
Hemoglobin: 11.9 g/dL (ref 11.7–15.5)
Lymphs Abs: 1929 cells/uL (ref 850–3900)
MCH: 24.1 pg — AB (ref 27.0–33.0)
MCHC: 31.2 g/dL — AB (ref 32.0–36.0)
MCV: 77.3 fL — AB (ref 80.0–100.0)
MONOS PCT: 5.6 %
MPV: 10.5 fL (ref 7.5–12.5)
NEUTROS PCT: 58.3 %
Neutro Abs: 3440 cells/uL (ref 1500–7800)
PLATELETS: 255 10*3/uL (ref 140–400)
RBC: 4.93 10*6/uL (ref 3.80–5.10)
RDW: 16.9 % — AB (ref 11.0–15.0)
TOTAL LYMPHOCYTE: 32.7 %
WBC: 5.9 10*3/uL (ref 3.8–10.8)
WBCMIX: 330 {cells}/uL (ref 200–950)

## 2017-04-03 LAB — COMPREHENSIVE METABOLIC PANEL
AG Ratio: 1.6 (calc) (ref 1.0–2.5)
ALKALINE PHOSPHATASE (APISO): 75 U/L (ref 33–115)
ALT: 8 U/L (ref 6–29)
AST: 11 U/L (ref 10–35)
Albumin: 3.8 g/dL (ref 3.6–5.1)
BUN: 10 mg/dL (ref 7–25)
CHLORIDE: 105 mmol/L (ref 98–110)
CO2: 30 mmol/L (ref 20–32)
CREATININE: 0.73 mg/dL (ref 0.50–1.10)
Calcium: 9 mg/dL (ref 8.6–10.2)
GLUCOSE: 160 mg/dL — AB (ref 65–139)
Globulin: 2.4 g/dL (calc) (ref 1.9–3.7)
Potassium: 3.4 mmol/L — ABNORMAL LOW (ref 3.5–5.3)
Sodium: 141 mmol/L (ref 135–146)
Total Bilirubin: 0.3 mg/dL (ref 0.2–1.2)
Total Protein: 6.2 g/dL (ref 6.1–8.1)

## 2017-04-03 LAB — GASTROINTESTINAL PATHOGEN PANEL PCR
C. DIFFICILE TOX A/B, PCR: NOT DETECTED
CAMPYLOBACTER, PCR: NOT DETECTED
Cryptosporidium, PCR: NOT DETECTED
E COLI (STEC) STX1/STX2, PCR: NOT DETECTED
E COLI 0157, PCR: NOT DETECTED
E coli (ETEC) LT/ST PCR: NOT DETECTED
Giardia lamblia, PCR: NOT DETECTED
Norovirus, PCR: NOT DETECTED
Rotavirus A, PCR: NOT DETECTED
SALMONELLA, PCR: NOT DETECTED
SHIGELLA, PCR: NOT DETECTED

## 2017-04-03 LAB — C. DIFFICILE GDH AND TOXIN A/B
GDH ANTIGEN: NOT DETECTED
MICRO NUMBER: 81409203
SPECIMEN QUALITY:: ADEQUATE
TOXIN A AND B: NOT DETECTED

## 2017-04-03 LAB — PANCREATIC ELASTASE, FECAL: PANCREATIC ELASTASE-1, STL: 481 ug/g

## 2017-04-11 ENCOUNTER — Telehealth: Payer: Self-pay

## 2017-04-11 NOTE — Telephone Encounter (Signed)
Rtc, lm for rtc 

## 2017-04-11 NOTE — Telephone Encounter (Signed)
Needs to speak with a nurse about injection for school. Please call pt back.

## 2017-04-12 ENCOUNTER — Telehealth: Payer: Self-pay | Admitting: Internal Medicine

## 2017-04-12 NOTE — Telephone Encounter (Signed)
Pt said it's been 2 weeks and she wanted to know her lab results today. I told her I would put a message in for the nurse. Please call her at [phone]

## 2017-04-12 NOTE — Telephone Encounter (Signed)
Pt is calling about her stool collection results.

## 2017-04-17 ENCOUNTER — Ambulatory Visit (INDEPENDENT_AMBULATORY_CARE_PROVIDER_SITE_OTHER): Payer: Medicare Other | Admitting: Internal Medicine

## 2017-04-17 ENCOUNTER — Encounter: Payer: Self-pay | Admitting: Internal Medicine

## 2017-04-17 VITALS — BP 135/84 | HR 81 | Wt 201.2 lb

## 2017-04-17 DIAGNOSIS — Z111 Encounter for screening for respiratory tuberculosis: Secondary | ICD-10-CM

## 2017-04-17 DIAGNOSIS — Z0184 Encounter for antibody response examination: Secondary | ICD-10-CM

## 2017-04-17 DIAGNOSIS — F17211 Nicotine dependence, cigarettes, in remission: Secondary | ICD-10-CM | POA: Diagnosis not present

## 2017-04-17 DIAGNOSIS — R197 Diarrhea, unspecified: Secondary | ICD-10-CM | POA: Diagnosis not present

## 2017-04-17 DIAGNOSIS — Z794 Long term (current) use of insulin: Secondary | ICD-10-CM

## 2017-04-17 DIAGNOSIS — Z0000 Encounter for general adult medical examination without abnormal findings: Secondary | ICD-10-CM

## 2017-04-17 DIAGNOSIS — J069 Acute upper respiratory infection, unspecified: Secondary | ICD-10-CM | POA: Diagnosis not present

## 2017-04-17 NOTE — Assessment & Plan Note (Signed)
Patient complains of ongoing diarrhea that she attributes to a change in her insulin (from Lantus to Levemir).  She desires to switch back to Lantus as her insurance is in the process of changing.  On chart review it appears she has been evaluated for diarrhea several times in the past, most recently several weeks ago by gastroenterology with several labs and stool studies resulting normal.  At this point, we will continue to encourage symptomatic treatment with Imodium and PCP follow-up for discussion of insulin regimen.

## 2017-04-17 NOTE — Patient Instructions (Signed)
FOLLOW-UP INSTRUCTIONS When: Next available PCP appointment  For: Follow up of Diabetes, Insulin regimen  What to bring: Medications   Next meet you today [name].  We will check blood work as a replacement for the immunization records needed by [employer].  We are also checking a TB skin test to make sure everything is in order for you to attend classes.  The TB skin test needs to be read in 2 days but you do not need a follow-up appointment for this.  Also putting in for you to have an appointment with your primary care doctor in order to straighten out issues with her insulin regimen and its potential relation to her diarrhea.  For your cold symptoms try over-the-counter Mucinex, Tylenol in order to control your symptoms, but try to avoid medications with phenylephrine or pseudoephedrine as this can increase blood pressure.

## 2017-04-17 NOTE — Assessment & Plan Note (Addendum)
General physical exam for college classes completed with no concerning findings or reasons to prevent the patient from taking classes uncovered. Also did TB skin test per school requirements

## 2017-04-17 NOTE — Progress Notes (Signed)
   CC: Physical Form   HPI:  [name] is a 52 y.o. with a past medical history as described below who presents to the clinic today for a physical exam and associated paperwork.  Physical: Patient has a form from a University which needs to be filled out prior to her enrolling in classes.  Diarrhea/Insulin Issues: She also notes intermittent diarrhea which she attributes to a change in her insulin regimen several months ago due to insurance reasons.  She was previously on Lantus did not experience any adverse effects and is now on Levemir, as well as metformin.  She states she states she recently saw a GI provider regarding her diarrhea.  Viral URI: She also notes recent symptoms of nonproductive cough, rhinorrhea/nasal congestion which she attributes to cold.  Denies fever, shortness of breath.  She has not tried medications for symptoms.    Past Medical History:  Diagnosis Date  . Alcohol abuse   . Anemia, iron deficiency   . Arteriosclerotic cardiovascular disease (ASCVD)    Minimal at cath in [facility].stress nuclear study in 8/08 with nl EF; neg stress echo in 2010  . Community acquired pneumonia 01/03/10, 05/2010, 04/2012   2011; with pleural effusion-[facility] acute resp failure; intubated in Jan 2014 (HMPV pneumonia)  . Depression   . Diabetes mellitus, type 2 ([place]) 2000   Onset in 2000; no insulin  . Diastolic dysfunction    grade 2 per echo 2011  . Dysphagia   . Gastroesophageal reflux disease    Schatzki's ring  . History of alcohol abuse 07/22/2007   Qualifier: Diagnosis of  By: Lenn Cal    . Hyperlipidemia   . Hypertension `   during treatment with Geodon  . Obesity   . PTSD (post-traumatic stress disorder)   . Pulmonary hypertension ([place]) 05/02/2012   Patient needs repeat echo in 06/2012   . Schizoaffective disorder    requiring multiple psychiatric admissions   Review of Systems:  Review of Systems  Constitutional: Negative for fever.    Gastrointestinal: Positive for diarrhea.  Psychiatric/Behavioral: Negative for depression.     Physical Exam:  Vitals:   04/17/17 1141  BP: 135/84  Pulse: 81  SpO2: 99%  Weight: 201 lb 3.2 oz (91.3 kg)   Physical Exam  Constitutional: She is oriented to person, place, and time. She appears well-developed and well-nourished.  HENT:  Mouth/Throat: Oropharynx is clear and moist. No oropharyngeal exudate.  Eyes: EOM are normal. Pupils are equal, round, and reactive to light.  Cardiovascular: Normal rate, regular rhythm, normal heart sounds and intact distal pulses.  Pulmonary/Chest: Effort normal and breath sounds normal. No respiratory distress. She has no wheezes. She has no rales.  Abdominal: Soft. Bowel sounds are normal. She exhibits no distension. There is no tenderness.  Neurological: She is alert and oriented to person, place, and time.  Skin: Skin is warm and dry.    Assessment & Plan:   See Encounters Tab for problem based charting.  Patient discussed with Dr. Dareen Piano

## 2017-04-17 NOTE — Assessment & Plan Note (Signed)
Patient notes various symptoms consistent with viral URI including cough, nasal congestion.  She denies fever and physical exam is reassuring with no pharyngeal exudate clear lung sounds.  Encouraged continued OTC symptomatic treatment with the exclusion of pseudoephedrine and phenylephrine given her difficult to control hypertension history.  She had not been trying over-the-counter medications due to worry of interactions with her chronic medical conditions.

## 2017-04-17 NOTE — Assessment & Plan Note (Addendum)
As part of requirement for classes at [employer] the patient needs immunization records.  She states she does not have records from her childhood immunizations.  Available records within our system show several influenza vaccines, PNA vaccine, 2 hepatitis B vaccine administrations, and Tdap doses.  Other records required are diphtheria/ pertussis series, MMR, and polio.  Labs drawn for titers of MMR, polio, hepatitis B, diphtheria/tetanus panel.  We will provide the patient and university with the results as they become available.  ** Addendum: Initial lab results show titers consistent with immunity for MMR, tetanus/diptheria. Polio studies pending. However, her Hep B surface Ab is inconsistent with immunity, though she previously had two doses in her thirties. We will restart the Hep B vaccine series. She may be a non-responder and require repeat titer and further documentation following the repeat vaccinations.

## 2017-04-18 ENCOUNTER — Encounter: Payer: Medicare Other | Admitting: Internal Medicine

## 2017-04-18 NOTE — Telephone Encounter (Signed)
Was seen in clinic 1/2

## 2017-04-19 ENCOUNTER — Ambulatory Visit (INDEPENDENT_AMBULATORY_CARE_PROVIDER_SITE_OTHER): Payer: Medicare Other | Admitting: *Deleted

## 2017-04-19 ENCOUNTER — Other Ambulatory Visit: Payer: Self-pay | Admitting: Internal Medicine

## 2017-04-19 DIAGNOSIS — Z23 Encounter for immunization: Secondary | ICD-10-CM | POA: Diagnosis present

## 2017-04-19 DIAGNOSIS — Z0184 Encounter for antibody response examination: Secondary | ICD-10-CM

## 2017-04-19 DIAGNOSIS — Z0000 Encounter for general adult medical examination without abnormal findings: Secondary | ICD-10-CM

## 2017-04-19 LAB — TB SKIN TEST
Induration: 0 mm
TB Skin Test: NEGATIVE

## 2017-04-19 NOTE — Progress Notes (Signed)
Internal Medicine Clinic Attending  Case discussed with Dr. Harden at the time of the visit.  We reviewed the resident's history and exam and pertinent patient test results.  I agree with the assessment, diagnosis, and plan of care documented in the resident's note.  

## 2017-04-22 ENCOUNTER — Other Ambulatory Visit: Payer: Self-pay | Admitting: Nurse Practitioner

## 2017-04-22 ENCOUNTER — Other Ambulatory Visit: Payer: Self-pay | Admitting: *Deleted

## 2017-04-22 DIAGNOSIS — R197 Diarrhea, unspecified: Secondary | ICD-10-CM

## 2017-04-22 LAB — DIPHTHERIA / TETANUS ANTIBODY PANEL
Diphtheria Ab: 0.98 IU/mL (ref <0.10)
Tetanus Ab, IgG: 6.11 IU/mL (ref <0.10)

## 2017-04-22 LAB — HEPATITIS B SURFACE ANTIBODY,QUALITATIVE: Hep B Surface Ab, Qual: NONREACTIVE

## 2017-04-22 LAB — MEASLES/MUMPS/RUBELLA IMMUNITY
MUMPS ABS, IGG: 187 [AU]/ml (ref >10.9)
RUBELLA: 3.16 {index} (ref >0.99)
RUBEOLA AB, IGG: 37.4 [AU]/ml (ref >29.9)

## 2017-04-22 LAB — POLIOVIRUS ANTIBODIES, TYPES 1, 2, AND 3: Poliovirus Ab Type 1: >=1:8 {titer}

## 2017-04-22 MED ORDER — DICYCLOMINE HCL 10 MG PO CAPS: 10.0000 mg | ORAL_CAPSULE | Freq: Four times a day (QID) | ORAL | 0 refills | Status: DC | PRN

## 2017-04-22 NOTE — Progress Notes (Signed)
Rx sent to pharmacy   

## 2017-04-23 MED ORDER — AMLODIPINE BESYLATE 10 MG PO TABS: 10.0000 mg | ORAL_TABLET | Freq: Every day | ORAL | 1 refills | Status: DC

## 2017-04-27 ENCOUNTER — Emergency Department (HOSPITAL_COMMUNITY): Payer: Medicare Other

## 2017-04-27 ENCOUNTER — Encounter (HOSPITAL_COMMUNITY): Payer: Self-pay | Admitting: Emergency Medicine

## 2017-04-27 ENCOUNTER — Other Ambulatory Visit: Payer: Self-pay

## 2017-04-27 ENCOUNTER — Emergency Department (HOSPITAL_COMMUNITY)
Admission: EM | Admit: 2017-04-27 | Discharge: 2017-04-27 | Disposition: A | Discharge status: Home / Self Care | Payer: Medicare Other | Attending: Emergency Medicine | Admitting: Emergency Medicine

## 2017-04-27 DIAGNOSIS — Z87891 Personal history of nicotine dependence: Secondary | ICD-10-CM | POA: Diagnosis not present

## 2017-04-27 DIAGNOSIS — J40 Bronchitis, not specified as acute or chronic: Secondary | ICD-10-CM | POA: Diagnosis not present

## 2017-04-27 DIAGNOSIS — Z79899 Other long term (current) drug therapy: Secondary | ICD-10-CM | POA: Diagnosis not present

## 2017-04-27 DIAGNOSIS — Z794 Long term (current) use of insulin: Secondary | ICD-10-CM | POA: Insufficient documentation

## 2017-04-27 DIAGNOSIS — J209 Acute bronchitis, unspecified: Secondary | ICD-10-CM | POA: Diagnosis not present

## 2017-04-27 DIAGNOSIS — R05 Cough: Secondary | ICD-10-CM

## 2017-04-27 DIAGNOSIS — E119 Type 2 diabetes mellitus without complications: Secondary | ICD-10-CM | POA: Insufficient documentation

## 2017-04-27 DIAGNOSIS — E785 Hyperlipidemia, unspecified: Secondary | ICD-10-CM | POA: Insufficient documentation

## 2017-04-27 DIAGNOSIS — R197 Diarrhea, unspecified: Secondary | ICD-10-CM | POA: Diagnosis not present

## 2017-04-27 DIAGNOSIS — R059 Cough, unspecified: Secondary | ICD-10-CM

## 2017-04-27 DIAGNOSIS — I110 Hypertensive heart disease with heart failure: Secondary | ICD-10-CM | POA: Insufficient documentation

## 2017-04-27 DIAGNOSIS — I5032 Chronic diastolic (congestive) heart failure: Secondary | ICD-10-CM | POA: Insufficient documentation

## 2017-04-27 DIAGNOSIS — E876 Hypokalemia: Secondary | ICD-10-CM | POA: Insufficient documentation

## 2017-04-27 DIAGNOSIS — K921 Melena: Secondary | ICD-10-CM | POA: Diagnosis not present

## 2017-04-27 DIAGNOSIS — K625 Hemorrhage of anus and rectum: Secondary | ICD-10-CM | POA: Diagnosis not present

## 2017-04-27 DIAGNOSIS — R1084 Generalized abdominal pain: Secondary | ICD-10-CM | POA: Diagnosis not present

## 2017-04-27 LAB — CBC WITH DIFFERENTIAL/PLATELET
BASOS ABS: 0 10*3/uL (ref 0.0–0.1)
Basophils Relative: 0 %
EOS PCT: 1 %
Eosinophils Absolute: 0.1 10*3/uL (ref 0.0–0.7)
HEMATOCRIT: 35.4 % — AB (ref 36.0–46.0)
Hemoglobin: 11.2 g/dL — ABNORMAL LOW (ref 12.0–15.0)
LYMPHS ABS: 3.4 10*3/uL (ref 0.7–4.0)
LYMPHS PCT: 45 %
MCH: 23.9 pg — AB (ref 26.0–34.0)
MCHC: 31.6 g/dL (ref 30.0–36.0)
MCV: 75.5 fL — AB (ref 78.0–100.0)
MONO ABS: 0.4 10*3/uL (ref 0.1–1.0)
Monocytes Relative: 5 %
NEUTROS ABS: 3.6 10*3/uL (ref 1.7–7.7)
Neutrophils Relative %: 49 %
PLATELETS: 261 10*3/uL (ref 150–400)
RBC: 4.69 MIL/uL (ref 3.87–5.11)
RDW: 18.4 % — AB (ref 11.5–15.5)
WBC: 7.6 10*3/uL (ref 4.0–10.5)

## 2017-04-27 LAB — URINALYSIS, ROUTINE W REFLEX MICROSCOPIC
BILIRUBIN URINE: NEGATIVE
Glucose, UA: 50 mg/dL — AB
HGB URINE DIPSTICK: NEGATIVE
KETONES UR: NEGATIVE mg/dL
LEUKOCYTES UA: NEGATIVE
NITRITE: NEGATIVE
PROTEIN: 30 mg/dL — AB
SPECIFIC GRAVITY, URINE: 1.015 (ref 1.005–1.030)
pH: 5 (ref 5.0–8.0)

## 2017-04-27 LAB — POC OCCULT BLOOD, ED: Fecal Occult Bld: NEGATIVE

## 2017-04-27 LAB — COMPREHENSIVE METABOLIC PANEL
ALBUMIN: 3.6 g/dL (ref 3.5–5.0)
ALT: 11 U/L — AB (ref 14–54)
AST: 16 U/L (ref 15–41)
Alkaline Phosphatase: 80 U/L (ref 38–126)
Anion gap: 9 (ref 5–15)
BUN: 9 mg/dL (ref 6–20)
CHLORIDE: 102 mmol/L (ref 101–111)
CO2: 24 mmol/L (ref 22–32)
CREATININE: 0.73 mg/dL (ref 0.44–1.00)
Calcium: 9.3 mg/dL (ref 8.9–10.3)
GFR calc Af Amer: >60 mL/min (ref >60)
GFR calc non Af Amer: >60 mL/min (ref >60)
Glucose, Bld: 186 mg/dL — ABNORMAL HIGH (ref 65–99)
Potassium: 3.1 mmol/L — ABNORMAL LOW (ref 3.5–5.1)
SODIUM: 135 mmol/L (ref 135–145)
Total Bilirubin: 0.2 mg/dL — ABNORMAL LOW (ref 0.3–1.2)
Total Protein: 6.8 g/dL (ref 6.5–8.1)

## 2017-04-27 LAB — LIPASE, BLOOD: Lipase: 28 U/L (ref 11–51)

## 2017-04-27 LAB — PREGNANCY, URINE: PREG TEST UR: NEGATIVE

## 2017-04-27 MED ORDER — ALBUTEROL SULFATE (2.5 MG/3ML) 0.083% IN NEBU
2.5000 mg | INHALATION_SOLUTION | Freq: Once | RESPIRATORY_TRACT | Status: AC
  Administered 2017-04-27: 2.5 mg via RESPIRATORY_TRACT
  Filled 2017-04-27: qty 3

## 2017-04-27 MED ORDER — BENZONATATE 100 MG PO CAPS: 100.0000 mg | ORAL_CAPSULE | Freq: Three times a day (TID) | ORAL | 0 refills | Status: DC | PRN

## 2017-04-27 MED ORDER — IOPAMIDOL (ISOVUE-300) INJECTION 61%
100.0000 mL | Freq: Once | INTRAVENOUS | Status: AC | PRN
  Administered 2017-04-27: 100 mL via INTRAVENOUS

## 2017-04-27 MED ORDER — SODIUM CHLORIDE 0.9 % IV BOLUS (SEPSIS)
1000.0000 mL | Freq: Once | INTRAVENOUS | Status: AC
  Administered 2017-04-27: 1000 mL via INTRAVENOUS

## 2017-04-27 MED ORDER — ALBUTEROL SULFATE HFA 108 (90 BASE) MCG/ACT IN AERS
4.0000 | INHALATION_SPRAY | RESPIRATORY_TRACT | Status: AC
  Administered 2017-04-27: 4 via RESPIRATORY_TRACT
  Filled 2017-04-27: qty 6.7

## 2017-04-27 MED ORDER — PREDNISONE 10 MG PO TABS
60.0000 mg | ORAL_TABLET | Freq: Once | ORAL | Status: AC
  Administered 2017-04-27: 60 mg via ORAL
  Filled 2017-04-27: qty 1

## 2017-04-27 MED ORDER — PREDNISONE 20 MG PO TABS: 40.0000 mg | ORAL_TABLET | Freq: Every day | ORAL | 0 refills | Status: DC

## 2017-04-27 MED ORDER — IPRATROPIUM-ALBUTEROL 0.5-2.5 (3) MG/3ML IN SOLN
3.0000 mL | Freq: Once | RESPIRATORY_TRACT | Status: AC
  Administered 2017-04-27: 3 mL via RESPIRATORY_TRACT
  Filled 2017-04-27: qty 3

## 2017-04-27 MED ORDER — POTASSIUM CHLORIDE CRYS ER 20 MEQ PO TBCR
40.0000 meq | EXTENDED_RELEASE_TABLET | Freq: Once | ORAL | Status: AC
  Administered 2017-04-27: 40 meq via ORAL
  Filled 2017-04-27: qty 2

## 2017-04-27 NOTE — Discharge Instructions (Signed)
Take the prescriptions as directed.  Increase your fluid intake (ie:  Gatoraide) for the next few days.  Avoid full strength juices, as well as milk and milk products until your diarrhea has resolved. Use your albuterol inhaler (2 to 4 puffs) every 4 hours for the next 7 days, then as needed for cough, wheezing, or shortness of breath.  Call your regular medical doctor Monday morning to schedule a follow up appointment within the next 3 days.  Return to the Emergency Department immediately sooner if worsening.

## 2017-04-27 NOTE — ED Triage Notes (Signed)
Pt states she has had large amounts bright red blood in BM and on tissue for three days. Hx of ulcer. Also c/o a cough X1 week.

## 2017-04-27 NOTE — ED Provider Notes (Signed)
[facility] EMERGENCY DEPARTMENT Provider Note   CSN: [id number] Arrival date & time: 04/27/17  2041     History   Chief Complaint Chief Complaint  Patient presents with  . GI Bleeding    HPI TAILA BASINSKI is a 52 y.o. female.  HPI  Pt was seen at 2055.  Per pt, c/o gradual onset and persistence of constant generalized abd "pain" for the past few days.  Has been associated with multiple intermittent episodes of diarrhea today.  Describes the abd pain as "aching."  Has been associated with "blood in stools" today. Pt states she has had intermittent diarrhea for the past several weeks, was evaluated by her GI MD "with a lot of tests," and "they couldn't say what it was." States she is "supposed to go back for further tests" (unclear what they are).  Pt also c/o cough for the past 1 week. Endorses hx of chronic cough and asthma. Continues to smoke cigarettes. Denies N/V, no fevers, no back pain, no rash, no CP/SOB, no black stools, no rectal pain.      Past Medical History:  Diagnosis Date  . Alcohol abuse   . Anemia, iron deficiency   . Arteriosclerotic cardiovascular disease (ASCVD)    Minimal at cath in [facility].stress nuclear study in 8/08 with nl EF; neg stress echo in 2010  . Community acquired pneumonia 01/03/10, 05/2010, 04/2012   2011; with pleural effusion-[facility] acute resp failure; intubated in Jan 2014 (HMPV pneumonia)  . Depression   . Diabetes mellitus, type 2 ([place]) 2000   Onset in 2000; no insulin  . Diastolic dysfunction    grade 2 per echo 2011  . Dysphagia   . Gastroesophageal reflux disease    Schatzki's ring  . History of alcohol abuse 07/22/2007   Qualifier: Diagnosis of  By: Lenn Cal    . Hyperlipidemia   . Hypertension `   during treatment with Geodon  . Obesity   . PTSD (post-traumatic stress disorder)   . Pulmonary hypertension ([place]) 05/02/2012   Patient needs repeat echo in 06/2012   . Schizoaffective disorder    requiring  multiple psychiatric admissions    Patient Active Problem List   Diagnosis Date Noted  . Immunity status testing 04/17/2017  . Abdominal pain 03/28/2017  . Viral wart on finger 10/08/2016  . Diabetes mellitus type II, controlled ([place]) 12/19/2014  . Palpitations 12/15/2014  . Encounter for health education 10/24/2014  . Left knee pain 08/25/2014  . Vision changes 08/12/2014  . Pulsatile tinnitus 08/12/2014  . IIH (idiopathic intracranial hypertension) 08/12/2014  . Seasonal allergies 02/23/2014  . Hypokalemia 12/25/2013  . Diarrhea 10/14/2013  . Viral URI 05/12/2013  . Daytime hypersomnolence 12/09/2012  . COPD (chronic obstructive pulmonary disease) ([place]) 10/20/2012  . GERD (gastroesophageal reflux disease) 10/15/2012  . Preventative health care 10/15/2012  . Pulmonary hypertension ([place]) 05/02/2012  . Diastolic heart failure ([place]) 02/07/2012  . IDA (iron deficiency anemia) 11/06/2011  . Arteriosclerotic cardiovascular disease (ASCVD)   . TOBACCO ABUSE 09/27/2009  . Hyperlipidemia 07/22/2007  . Obesity 07/22/2007  . Bipolar 1 disorder ([place]) 07/22/2007  . Resistant hypertension 07/22/2007  . Diabetes mellitus type 2, controlled ([place]) 09/28/2002    Past Surgical History:  Procedure Laterality Date  . COLONOSCOPY  01/2006   internal hemorrhoids  . COLONOSCOPY  01/10/2012   Dr. Rourk:Single anal canal hemorrhoidal tag likely source of  trivial hematochezia; right-sided colonic diverticulosis  . DILATION AND CURETTAGE, DIAGNOSTIC / THERAPEUTIC  [place] N/A 08/18/2015   Procedure: ESOPHAGEAL DILATION;  Surgeon: Daneil Dolin, MD;  Location: [facility] ENDO SUITE;  Service: Endoscopy;  Laterality: N/A;  . ESOPHAGOGASTRODUODENOSCOPY  09/16/08   Dr. Trevor Iha hiatal hernia/excoriations involving the cardia and mucosa consistent with trauma, antral erosions  of linear petechiae ? gastritis versus early gastric antral vascular  ectasia.Marland Kitchen biopsy showed reactive  gastropathy. No H. pylori.  . ESOPHAGOGASTRODUODENOSCOPY  09/2007   Dr. Evalee Mutton ring, dilated to 43 French Maloney dilator, small hiatal hernia, antral erosions, biopsies reactive gastropathy.  . ESOPHAGOGASTRODUODENOSCOPY (EGD) WITH PROPOFOL N/A 12/17/2013   WVP:XTGGYIR antral erosions and petechiae. Small hiatal hernia. No endoscopic explanation for patient's symptoms  . ESOPHAGOGASTRODUODENOSCOPY (EGD) WITH PROPOFOL N/A 08/18/2015   Procedure: ESOPHAGOGASTRODUODENOSCOPY (EGD) WITH PROPOFOL;  Surgeon: Daneil Dolin, MD;  Location: [facility] ENDO SUITE;  Service: Endoscopy;  Laterality: N/A;  0845  . SAVORY DILATION  07/17/2011   [facility]-MAC sedation-->distal esophageal stricture s/p dilation, chronic gastritis, multiple ulcers in stomach. no h.pylori    OB History    Gravida Para Term Preterm AB Living   _0 SAB TAB Ectopic Multiple Live Births     1     2       Home Medications    Prior to Admission medications   Medication Sig Start Date End Date Taking? Authorizing Provider  ACCU-CHEK FASTCLIX LANCETS MISC Check blood sugar three times a day 01/09/17   Melanee Spry, MD  acetaminophen (TYLENOL) 500 MG tablet Take 1,000 mg by mouth every 6 (six) hours as needed for mild pain.    [provider]  amLODipine (NORVASC) 10 MG tablet Take 1 tablet (10 mg total) by mouth daily. 04/23/17 04/23/18  Ina Homes, MD  dicyclomine (BENTYL) 10 MG capsule Take 1 capsule (10 mg total) by mouth 4 (four) times daily as needed for spasms. 04/22/17 05/22/17  Carlis Stable, NP  esomeprazole (NEXIUM) 40 MG capsule TAKE ONE CAPSULE BY MOUTH 30 MINUTES BEFORE BREAKFAST AND MAY TAKE ADDITIONAL DOSE BEFORE EVENING MEAL IF NEEDED. 02/07/17   Carlis Stable, NP  glucose blood (ACCU-CHEK SMARTVIEW) test strip Check blood sugar three times a day 01/09/17   Melanee Spry, MD  ibuprofen (ADVIL,MOTRIN) 200 MG tablet Take 400 mg by mouth every 6 (six) hours as needed for mild pain.    [provider]  Insulin Detemir (LEVEMIR FLEXPEN) 100 UNIT/ML Pen Inject 16 Units into the skin daily at 10 pm. 09/06/16   Holley Raring, MD  Insulin Pen Needle (NOVOFINE) 32G X 6 MM MISC USE TO INJECT INSULIN ONCE DAILY. 09/06/16   Holley Raring, MD  loratadine (CLARITIN) 10 MG tablet Take 1 tablet (10 mg total) by mouth daily. 01/11/17   Lacroce, Hulen Shouts, MD  losartan (COZAAR) 100 MG tablet Take 1 tablet (100 mg total) by mouth daily. 01/09/17   Lacroce, Hulen Shouts, MD  lovastatin (MEVACOR) 40 MG tablet Take 1 tablet (40 mg total) by mouth daily. 12/11/16 03/28/17  Ina Homes, MD  metFORMIN (GLUCOPHAGE) 1000 MG tablet TAKE (1) TABLET BY MOUTH TWICE DAILY WITH MEALS. 09/06/16   Holley Raring, MD  metoprolol tartrate (LOPRESSOR) 100 MG tablet Take 1 tablet (100 mg total) by mouth 2 (two) times daily. 01/09/17   Lacroce, Hulen Shouts, MD  mometasone (NASONEX) 50 MCG/ACT nasal spray Place 2 sprays into the nose daily. 01/11/17   Melanee Spry, MD  Multiple Vitamins-Minerals (MULTIVITAMIN  WITH MINERALS) tablet Take 1 tablet by mouth daily.    [provider]  potassium chloride SA (K-DUR,KLOR-CON) 20 MEQ tablet Take 2 tablets (40 mEq total) by mouth daily. Patient not taking: Reported on 03/28/2017 01/11/17 01/14/17  Melanee Spry, MD  spironolactone (ALDACTONE) 50 MG tablet Take 1 tablet (50 mg total) by mouth daily. 02/14/17   Maryellen Pile, MD  VENTOLIN HFA 108 (90 Base) MCG/ACT inhaler INHALE 2 PUFFS 3 TIMES DAILY AS NEEDED FOR WHEEZING OR SHORTNESS OF BREATH. 07/16/16   Holley Raring, MD  ziprasidone (GEODON) 80 MG capsule Take 160 mg by mouth at bedtime.     [provider]    Family History Family History  Problem Relation Age of Onset  . Hypertension Mother   . Stroke Father        deceased at age 63  . Colon cancer Other   . Heart disease Sister   . Diabetes Unknown   . High Cholesterol Unknown   . Arthritis Unknown   . Anesthesia problems Neg Hx   .  Hypotension Neg Hx   . Malignant hyperthermia Neg Hx   . Pseudochol deficiency Neg Hx     Social History Social History   Tobacco Use  . Smoking status: Former Smoker    Packs/day: 1.00    Years: 30.00    Pack years: 30.00    Types: Cigarettes    Start date: 05/18/2012    Last attempt to quit: 03/28/2017    Years since quitting: 0.0  . Smokeless tobacco: Never Used  Substance Use Topics  . Alcohol use: No    Alcohol/week: 0.0 oz    Comment: hx of ETOH abuse  . Drug use: No     Allergies   Cephalexin; Metronidazole; Orange; Shrimp [shellfish allergy]; Penicillins; Sulfonamide derivatives; Glipizide; Ace inhibitors; and Sulfamethoxazole-trimethoprim   Review of Systems Review of Systems ROS: Statement: All systems negative except as marked or noted in the HPI; Constitutional: Negative for fever and chills. ; ; Eyes: Negative for eye pain, redness and discharge. ; ; ENMT: Negative for ear pain, hoarseness, nasal congestion, sinus pressure and sore throat. ; ; Cardiovascular: Negative for chest pain, palpitations, diaphoresis, dyspnea and peripheral edema. ; ; Respiratory: +cough. Negative for wheezing and stridor. ; ; Gastrointestinal: +diarrhea, abd pain, rectal bleeding. Negative for nausea, vomiting, hematemesis, jaundice. . ; ; Genitourinary: Negative for dysuria, flank pain and hematuria. ; ; Musculoskeletal: Negative for back pain and neck pain. Negative for swelling and trauma.; ; Skin: Negative for pruritus, rash, abrasions, blisters, bruising and skin lesion.; ; Neuro: Negative for headache, lightheadedness and neck stiffness. Negative for weakness, altered level of consciousness, altered mental status, extremity weakness, paresthesias, involuntary movement, seizure and syncope.       Physical Exam Updated Vital Signs BP (!) 181/96 (BP Location: Right Arm)   Pulse 83   Temp 99.1 F (37.3 C) (Oral)   Resp 18   Ht _0  (1.626 m)   Wt 90.7 kg (200 lb)   LMP 04/20/2017    SpO2 100%   BMI 34.33 kg/m   Physical Exam 2100: Physical examination:  Nursing notes reviewed; Vital signs and O2 SAT reviewed;  Constitutional: Well developed, Well nourished, Well hydrated, In no acute distress; Head:  Normocephalic, atraumatic; Eyes: EOMI, PERRL, No scleral icterus; ENMT: Mouth and pharynx normal, Mucous membranes moist; Neck: Supple, Full range of motion, No lymphadenopathy; Cardiovascular: Regular rate and rhythm, No gallop; Respiratory: Breath sounds clear & equal  bilaterally, No wheezes. +non-productive cough during exam. Speaking full sentences with ease, Normal respiratory effort/excursion; Chest: Nontender, Movement normal; Abdomen: Soft, +mild diffuse tenderness to palp. No rebound or guarding. Nondistended, Normal bowel sounds. Rectal exam performed w/permission of pt and ED RN chaperone present.  Anal tone normal.  Non-tender, soft brown stool in rectal vault, heme neg.  No fissures, no external hemorrhoids, no palp masses.; Genitourinary: No CVA tenderness; Extremities: Pulses normal, No tenderness, No edema, No calf edema or asymmetry.; Neuro: AA&Ox3, Major CN grossly intact.  Speech clear. No gross focal motor or sensory deficits in extremities.; Skin: Color normal, Warm, Dry.  ED Treatments / Results  Labs (all labs ordered are listed, but only abnormal results are displayed)   EKG  EKG Interpretation None       Radiology   Procedures Procedures (including critical care time)  Medications Ordered in ED Medications  sodium chloride 0.9 % bolus 1,000 mL (not administered)  ipratropium-albuterol (DUONEB) 0.5-2.5 (3) MG/3ML nebulizer solution 3 mL (not administered)  albuterol (PROVENTIL) (2.5 MG/3ML) 0.083% nebulizer solution 2.5 mg (not administered)     Initial Impression / Assessment and Plan / ED Course  I have reviewed the triage vital signs and the nursing notes.  Pertinent labs & imaging results that were available during my care of the  patient were reviewed by me and considered in my medical decision making (see chart for details).  MDM Reviewed: previous chart, nursing note and vitals Reviewed previous: labs Interpretation: labs, x-ray and CT scan    Results for orders placed or performed during the hospital encounter of 04/27/17  Comprehensive metabolic panel  Result Value Ref Range   Sodium 135 135 - 145 mmol/L   Potassium 3.1 (L) 3.5 - 5.1 mmol/L   Chloride 102 101 - 111 mmol/L   CO2 24 22 - 32 mmol/L   Glucose, Bld 186 (H) 65 - 99 mg/dL   BUN 9 6 - 20 mg/dL   Creatinine, Ser 0.73 0.44 - 1.00 mg/dL   Calcium 9.3 8.9 - 10.3 mg/dL   Total Protein 6.8 6.5 - 8.1 g/dL   Albumin 3.6 3.5 - 5.0 g/dL   AST 16 15 - 41 U/L   ALT 11 (L) 14 - 54 U/L   Alkaline Phosphatase 80 38 - 126 U/L   Total Bilirubin 0.2 (L) 0.3 - 1.2 mg/dL   GFR calc non Af Amer >60 >60 mL/min   GFR calc Af Amer >60 >60 mL/min   Anion gap 9 5 - 15  Lipase, blood  Result Value Ref Range   Lipase 28 11 - 51 U/L  CBC with Differential  Result Value Ref Range   WBC 7.6 4.0 - 10.5 K/uL   RBC 4.69 3.87 - 5.11 MIL/uL   Hemoglobin 11.2 (L) 12.0 - 15.0 g/dL   HCT 35.4 (L) 36.0 - 46.0 %   MCV 75.5 (L) 78.0 - 100.0 fL   MCH 23.9 (L) 26.0 - 34.0 pg   MCHC 31.6 30.0 - 36.0 g/dL   RDW 18.4 (H) 11.5 - 15.5 %   Platelets 261 150 - 400 K/uL   Neutrophils Relative % 49 %   Neutro Abs 3.6 1.7 - 7.7 K/uL   Lymphocytes Relative 45 %   Lymphs Abs 3.4 0.7 - 4.0 K/uL   Monocytes Relative 5 %   Monocytes Absolute 0.4 0.1 - 1.0 K/uL   Eosinophils Relative 1 %   Eosinophils Absolute 0.1 0.0 - 0.7 K/uL   Basophils Relative  0 %   Basophils Absolute 0.0 0.0 - 0.1 K/uL  Urinalysis, Routine w reflex microscopic  Result Value Ref Range   Color, Urine YELLOW YELLOW   APPearance CLEAR CLEAR   Specific Gravity, Urine 1.015 1.005 - 1.030   pH 5.0 5.0 - 8.0   Glucose, UA 50 (A) NEGATIVE mg/dL   Hgb urine dipstick NEGATIVE NEGATIVE   Bilirubin Urine NEGATIVE  NEGATIVE   Ketones, ur NEGATIVE NEGATIVE mg/dL   Protein, ur 30 (A) NEGATIVE mg/dL   Nitrite NEGATIVE NEGATIVE   Leukocytes, UA NEGATIVE NEGATIVE   RBC / HPF 0-5 0 - 5 RBC/hpf   WBC, UA 0-5 0 - 5 WBC/hpf   Bacteria, UA RARE (A) NONE SEEN   Squamous Epithelial / LPF 0-5 (A) NONE SEEN   Mucus PRESENT   Pregnancy, urine  Result Value Ref Range   Preg Test, Ur NEGATIVE NEGATIVE  POC occult blood, ED  Result Value Ref Range   Fecal Occult Bld NEGATIVE NEGATIVE   Dg Chest 2 View Result Date: 04/27/2017 CLINICAL DATA:  Bright red blood per rectum. EXAM: CHEST  2 VIEW COMPARISON:  07/12/2016 FINDINGS: The heart size and mediastinal contours are within normal limits. Both lungs are clear. The visualized skeletal structures are unremarkable. IMPRESSION: Normal chest x-ray. Electronically Signed   By: Marijo Sanes M.D.   On: 04/27/2017 23:06   Ct Abdomen Pelvis W Contrast Result Date: 04/27/2017 CLINICAL DATA:  Hematochezia EXAM: CT ABDOMEN AND PELVIS WITH CONTRAST TECHNIQUE: Multidetector CT imaging of the abdomen and pelvis was performed using the standard protocol following bolus administration of intravenous contrast. CONTRAST:  140m ISOVUE-300 IOPAMIDOL (ISOVUE-300) INJECTION 61% COMPARISON:  CT scan 08/13/2011 FINDINGS: Lower chest: The lung bases are clear. No pleural effusion or pulmonary nodule. The heart is normal in size. No pericardial effusion. Hepatobiliary: No focal hepatic lesions or intrahepatic biliary dilatation. The gallbladder is normal. No common bile duct dilatation. Pancreas: No mass, inflammation or ductal dilatation. Spleen: Normal size.  No focal lesions. Adrenals/Urinary Tract: The adrenal glands and kidneys are unremarkable. The bladder is unremarkable. Stomach/Bowel: The stomach, duodenum, small bowel and colon are grossly normal without oral contrast. No acute inflammatory changes, mass lesions or obstructive findings. The terminal ileum is normal. The appendix is normal.  Vascular/Lymphatic: Advanced atherosclerotic calcifications involving the aorta and iliac arteries for age. No aneurysm. The major venous structures are patent. No mesenteric or retroperitoneal mass or adenopathy. Small scattered lymph nodes are noted. Reproductive: The uterus and ovaries are unremarkable. Other: No pelvic mass or adenopathy. No free pelvic fluid collections. No inguinal mass or adenopathy. No abdominal wall hernia or subcutaneous lesions. Musculoskeletal: No significant bony findings. IMPRESSION: No acute abdominal/pelvic findings, mass lesions or adenopathy. Age advanced atherosclerotic calcifications involving the distal aorta and iliac arteries. Electronically Signed   By: PMarijo SanesM.D.   On: 04/27/2017 22:58    2320:  Prednisone and nebs x2 given for cough. Potassium repleted PO. Pt has tol PO well while in the ED without N/V.  No stooling while in the ED.  Abd benign, VSS. Feels better and wants to go home now. Tx symptomatically for cough. Dx and testing d/w pt.  Questions answered.  Verb understanding, agreeable to d/c home with outpt f/u.      Final Clinical Impressions(s) / ED Diagnoses   Final diagnoses:  None    ED Discharge Orders    None       MFrancine Graven DO 04/30/17 1444

## 2017-04-30 ENCOUNTER — Telehealth: Payer: Self-pay | Admitting: Pharmacist

## 2017-05-01 NOTE — Progress Notes (Signed)
DM patient initiated on steroid therapy, unable to reach

## 2017-05-02 ENCOUNTER — Encounter: Payer: Medicare Other | Admitting: Internal Medicine

## 2017-05-13 ENCOUNTER — Other Ambulatory Visit: Payer: Self-pay | Admitting: Internal Medicine

## 2017-05-13 NOTE — Telephone Encounter (Signed)
Called pt to find out which med she's requesting.

## 2017-05-13 NOTE — Telephone Encounter (Signed)
Patient calling back.   °

## 2017-05-13 NOTE — Telephone Encounter (Signed)
Patient requesting refills on medicine °

## 2017-05-13 NOTE — Telephone Encounter (Signed)
Returned call- no answer. 

## 2017-05-14 DIAGNOSIS — F319 Bipolar disorder, unspecified: Secondary | ICD-10-CM | POA: Diagnosis not present

## 2017-05-16 ENCOUNTER — Encounter: Payer: Self-pay | Admitting: Internal Medicine

## 2017-05-16 ENCOUNTER — Other Ambulatory Visit: Payer: Self-pay

## 2017-05-16 ENCOUNTER — Ambulatory Visit (INDEPENDENT_AMBULATORY_CARE_PROVIDER_SITE_OTHER): Payer: Medicare Other | Admitting: Internal Medicine

## 2017-05-16 VITALS — BP 141/92 | HR 79 | Temp 97.4°F | Ht 64.0 in | Wt 196.3 lb

## 2017-05-16 DIAGNOSIS — B370 Candidal stomatitis: Secondary | ICD-10-CM | POA: Diagnosis not present

## 2017-05-16 DIAGNOSIS — F17211 Nicotine dependence, cigarettes, in remission: Secondary | ICD-10-CM

## 2017-05-16 DIAGNOSIS — Z79899 Other long term (current) drug therapy: Secondary | ICD-10-CM

## 2017-05-16 DIAGNOSIS — R05 Cough: Secondary | ICD-10-CM | POA: Diagnosis not present

## 2017-05-16 DIAGNOSIS — Z794 Long term (current) use of insulin: Secondary | ICD-10-CM

## 2017-05-16 DIAGNOSIS — E119 Type 2 diabetes mellitus without complications: Secondary | ICD-10-CM

## 2017-05-16 DIAGNOSIS — I10 Essential (primary) hypertension: Secondary | ICD-10-CM | POA: Diagnosis not present

## 2017-05-16 HISTORY — DX: Candidal stomatitis: B37.0

## 2017-05-16 LAB — POCT GLYCOSYLATED HEMOGLOBIN (HGB A1C): HEMOGLOBIN A1C: 8.1

## 2017-05-16 LAB — GLUCOSE, CAPILLARY: Glucose-Capillary: 299 mg/dL — ABNORMAL HIGH (ref 65–99)

## 2017-05-16 MED ORDER — LORATADINE 10 MG PO TABS: 10.0000 mg | ORAL_TABLET | Freq: Every day | ORAL | 3 refills | Status: DC

## 2017-05-16 MED ORDER — SPIRONOLACTONE 50 MG PO TABS: 50.0000 mg | ORAL_TABLET | Freq: Every day | ORAL | 3 refills | Status: DC

## 2017-05-16 MED ORDER — INSULIN GLARGINE 100 UNIT/ML SOLOSTAR PEN: 18.0000 [IU] | PEN_INJECTOR | Freq: Every day | SUBCUTANEOUS | 11 refills | Status: DC

## 2017-05-16 MED ORDER — AMLODIPINE BESYLATE 10 MG PO TABS: 10.0000 mg | ORAL_TABLET | Freq: Every day | ORAL | 2 refills | Status: DC

## 2017-05-16 MED ORDER — ESOMEPRAZOLE MAGNESIUM 40 MG PO CPDR: 40.0000 mg | DELAYED_RELEASE_CAPSULE | Freq: Every day | ORAL | 1 refills | Status: DC

## 2017-05-16 MED ORDER — LOSARTAN POTASSIUM 100 MG PO TABS: 100.0000 mg | ORAL_TABLET | Freq: Every day | ORAL | 3 refills | Status: DC

## 2017-05-16 MED ORDER — CLOTRIMAZOLE 10 MG MT TROC: 10.0000 mg | Freq: Every day | OROMUCOSAL | 0 refills | Status: AC

## 2017-05-16 MED ORDER — METOPROLOL TARTRATE 100 MG PO TABS: 100.0000 mg | ORAL_TABLET | Freq: Two times a day (BID) | ORAL | 3 refills | Status: DC

## 2017-05-16 MED ORDER — FERROUS SULFATE 325 (65 FE) MG PO TABS: 325.0000 mg | ORAL_TABLET | ORAL | 1 refills | Status: DC

## 2017-05-16 MED ORDER — METFORMIN HCL 1000 MG PO TABS: ORAL_TABLET | ORAL | 3 refills | Status: DC

## 2017-05-16 MED ORDER — PEN NEEDLES 31G X 5 MM MISC: 18.0000 [IU] | Freq: Every day | 11 refills | Status: DC

## 2017-05-16 MED ORDER — ALBUTEROL SULFATE HFA 108 (90 BASE) MCG/ACT IN AERS: INHALATION_SPRAY | RESPIRATORY_TRACT | 3 refills | Status: DC

## 2017-05-16 MED ORDER — LOVASTATIN 40 MG PO TABS: 40.0000 mg | ORAL_TABLET | Freq: Every day | ORAL | 3 refills | Status: DC

## 2017-05-16 NOTE — Assessment & Plan Note (Signed)
She has a history of difficult to control hypertension and reports recently running out of her losartan and Spironolactone.  Though her blood pressure today is 141/92, will refill these medications based on her prior values and regimen.  --Cont losartan 100 mg, amlodipine 10 mg, metoprolol 100 mg twice daily, spironolactone 50 mg

## 2017-05-16 NOTE — Assessment & Plan Note (Signed)
She has a history of diabetes currently on 16 units of Levemir nightly.  She feels Levemir has caused her to have chronic intermittent diarrhea since she was switched from Lantus due to insurance reasons.  She has again changed her insurance and is requesting going back to Lantus.  Repeat A1c today was 8.1, blood glucose 299.  We will increase her Lantus slightly today, may need further increases in the future.  A letter was provided that stated she would benefit from weight reduction via weight watchers given her diabetes, hypertension, hyperlipidemia.  Hopefully, the insurance coverage motivation will help her improve control of her chronic medical conditions.  --Switch levemir to Lantus, increase to 18 U nightly --Cont metformin

## 2017-05-16 NOTE — Patient Instructions (Addendum)
Nice to see you today [name].  We refilled all her medications and sent them to the right aid in [place] except for the loratadine which we sent to the [facility] outpatient pharmacy.  I have written a letter for insurance company that weight watchers would be a benefit.  We are also going to increase your dose of Lantus or long-acting insulin from 16 units to 18 units to help get your diabetes back in the right direction.  With the  material on your tongue, we are also going to do a medicine in almost like a cough drop form to help this. Be sure to use these five times a day for a week.   We'd like to see you back in about 6 weeks with your primary doctor to see how you're doing

## 2017-05-16 NOTE — Progress Notes (Signed)
   CC: Medication management   HPI:  [name] is a 52 y.o. female with a past medical history as described below who presents to the clinic for medication management.   She reports she has recently switched insurance coverage which will take effect tomorrow at 1 February.  Because of this change she is requesting a change to her long-acting insulin from Levemir to Lantus, as well as all her medicines being re-sent to a different pharmacy.  She is also requesting a letter to provide to her insurance company which states that weight watchers would benefit her as she was told that they may pay for this program.  She states she has previously met with nutrition counseling here and would like to try weight watchers rather than continue with nutrition through the clinic.  She has run out of spironolactone, losartan, and lovastatin.  She is taking her other medications as prescribed.  She has recently had a viral URI and notes an intermittent cough still.  She reported to the ED for this illness previously and was prescribed a course of prednisone which has affected her blood sugar control.  Past Medical History:  Diagnosis Date  . Alcohol abuse   . Anemia, iron deficiency   . Arteriosclerotic cardiovascular disease (ASCVD)    Minimal at cath in [facility].stress nuclear study in 8/08 with nl EF; neg stress echo in 2010  . Community acquired pneumonia 01/03/10, 05/2010, 04/2012   2011; with pleural effusion-[facility] acute resp failure; intubated in Jan 2014 (HMPV pneumonia)  . Depression   . Diabetes mellitus, type 2 ([place]) 2000   Onset in 2000; no insulin  . Diastolic dysfunction    grade 2 per echo 2011  . Dysphagia   . Gastroesophageal reflux disease    Schatzki's ring  . History of alcohol abuse 07/22/2007   Qualifier: Diagnosis of  By: Lenn Cal    . Hyperlipidemia   . Hypertension `   during treatment with Geodon  . Obesity   . PTSD (post-traumatic stress  disorder)   . Pulmonary hypertension ([place]) 05/02/2012   Patient needs repeat echo in 06/2012   . Schizoaffective disorder    requiring multiple psychiatric admissions   Review of Systems:  Review of Systems  Constitutional: Negative for fever and weight loss.  Respiratory: Positive for cough.   Cardiovascular: Negative for chest pain.     Physical Exam:  Vitals:   05/16/17 0924  BP: (!) 141/92  Pulse: 79  Temp: (!) 97.4 F (36.3 C)  TempSrc: Oral  SpO2: 100%  Weight: 196 lb 4.8 oz (89 kg)  Height: _0  (1.626 m)   General: Sitting in chair comfortably, no acute distress HEENT: Moist mucous membranes, whitish/tan plaque over tongue, no pharyngeal exudate, normal conjunctiva CV: Regular rate and rhythm, no murmur appreciated Resp: Clear breath sounds bilaterally, normal work of breathing, no distress  Extr: Good peripheral pulses, no edema Neuro: Alert and oriented x3  Skin: Warm, dry      Assessment & Plan:   See Encounters Tab for problem based charting.  Patient discussed with Dr. Beryle Beams

## 2017-05-16 NOTE — Progress Notes (Signed)
Medicine attending: Medical history, presenting problems, physical findings, and medications, reviewed with resident physician Dr Tawny Asal on the day of the patient visit and I concur with his evaluation and management plan. No acute issues at present.

## 2017-05-16 NOTE — Assessment & Plan Note (Signed)
On exam, she has oral thrush in the setting of recent steroid use and poorly controlled diabetes.  We will treat for oral candidiasis with clotrimazole troches.   --Clotrimazole troche five times daily x 7 days

## 2017-05-21 ENCOUNTER — Ambulatory Visit: Payer: Medicare Other

## 2017-05-28 ENCOUNTER — Other Ambulatory Visit: Payer: Self-pay

## 2017-05-28 ENCOUNTER — Ambulatory Visit: Payer: Medicaid Other | Admitting: Nurse Practitioner

## 2017-05-28 ENCOUNTER — Encounter (HOSPITAL_COMMUNITY): Payer: Self-pay | Admitting: Emergency Medicine

## 2017-05-28 ENCOUNTER — Ambulatory Visit (HOSPITAL_COMMUNITY)
Admission: EM | Admit: 2017-05-28 | Discharge: 2017-05-28 | Disposition: A | Discharge status: Home / Self Care | Payer: Medicare Other | Attending: Family Medicine | Admitting: Family Medicine

## 2017-05-28 ENCOUNTER — Ambulatory Visit (INDEPENDENT_AMBULATORY_CARE_PROVIDER_SITE_OTHER): Payer: Medicare Other

## 2017-05-28 ENCOUNTER — Emergency Department (HOSPITAL_COMMUNITY)
Admission: EM | Admit: 2017-05-28 | Discharge: 2017-05-28 | Disposition: A | Discharge status: Home / Self Care | Payer: Medicare Other

## 2017-05-28 DIAGNOSIS — E119 Type 2 diabetes mellitus without complications: Secondary | ICD-10-CM | POA: Diagnosis not present

## 2017-05-28 DIAGNOSIS — J40 Bronchitis, not specified as acute or chronic: Secondary | ICD-10-CM | POA: Diagnosis not present

## 2017-05-28 LAB — GLUCOSE, CAPILLARY: GLUCOSE-CAPILLARY: 141 mg/dL — AB (ref 65–99)

## 2017-05-28 MED ORDER — IPRATROPIUM BROMIDE 0.06 % NA SOLN: 2.0000 | Freq: Four times a day (QID) | NASAL | 0 refills | Status: DC

## 2017-05-28 MED ORDER — DOXYCYCLINE HYCLATE 100 MG PO CAPS: 100.0000 mg | ORAL_CAPSULE | Freq: Two times a day (BID) | ORAL | 0 refills | Status: DC

## 2017-05-28 MED ORDER — ONDANSETRON 4 MG PO TBDP: 4.0000 mg | ORAL_TABLET | Freq: Three times a day (TID) | ORAL | 0 refills | Status: DC | PRN

## 2017-05-28 MED ORDER — FLUTICASONE PROPIONATE 50 MCG/ACT NA SUSP: 2.0000 | Freq: Every day | NASAL | 0 refills | Status: DC

## 2017-05-28 MED ORDER — IPRATROPIUM-ALBUTEROL 0.5-2.5 (3) MG/3ML IN SOLN
3.0000 mL | Freq: Once | RESPIRATORY_TRACT | Status: AC
  Administered 2017-05-28: 3 mL via RESPIRATORY_TRACT

## 2017-05-28 MED ORDER — IPRATROPIUM-ALBUTEROL 0.5-2.5 (3) MG/3ML IN SOLN
RESPIRATORY_TRACT | Status: AC
  Filled 2017-05-28: qty 3

## 2017-05-28 NOTE — Discharge Instructions (Signed)
Azithromycin interacts with one of the medicine you take, so we are giving you doxycycline for your symptoms. Start flonase, atrovent nasal spray for nasal congestion/drainage. You can use over the counter nasal saline rinse such as neti pot for nasal congestion. Zofran as needed for nausea/vomiting. As discussed, since you are having diarrhea, the antibiotics can make the symptoms worse. Keep hydrated, your urine should be clear to pale yellow in color. Tylenol/motrin for fever and pain. Monitor for any worsening of symptoms, chest pain, shortness of breath, wheezing, swelling of the throat, follow up for reevaluation. Otherwise, please follow up with your PCP for further evaluation needed.

## 2017-05-28 NOTE — ED Triage Notes (Signed)
Pt reports a cough for one month.  She was diagnosed with Bronchitis in January and prescribed steroids and an inhaler.  She reports diarrhea, chills, and one episode of vomiting yesterday.

## 2017-05-28 NOTE — ED Provider Notes (Signed)
[name]    CSN: [id number] Arrival date & time: 05/28/17  1727     History   Chief Complaint Chief Complaint  Patient presents with  . Cough    HPI [name] is a 52 y.o. female.   52 year old female with history of DM, alcohol abuse, pulmonary hypertension, ASCVD, diastolic heart failure comes in for URI symptoms.  States she was seen beginning of January due to cough, and was treated for bronchitis with steroids and inhaler.  States continues to have nasal congestion, rhinorrhea, nonproductive cough.  She denies chest pain.  Has felt short of breath after coughing, and has had some orthopnea.  Denies swelling of the legs.  States started having diarrhea, chills, vomiting yesterday.  She has had one episode of nonbilious nonbloody vomit, and 4-5 episodes of diarrhea per day.  Denies blood in stool.  Denies recent antibiotic use.  She has had tactile fevers.  States she finished her prednisone pack, but has not checked her blood glucose since.  Denies abdominal pain.       Past Medical History:  Diagnosis Date  . Alcohol abuse   . Anemia, iron deficiency   . Arteriosclerotic cardiovascular disease (ASCVD)    Minimal at cath in [facility].stress nuclear study in 8/08 with nl EF; neg stress echo in 2010  . Community acquired pneumonia 01/03/10, 05/2010, 04/2012   2011; with pleural effusion-[facility] acute resp failure; intubated in Jan 2014 (HMPV pneumonia)  . Depression   . Diabetes mellitus, type 2 ([place]) 2000   Onset in 2000; no insulin  . Diastolic dysfunction    grade 2 per echo 2011  . Dysphagia   . Gastroesophageal reflux disease    Schatzki's ring  . History of alcohol abuse 07/22/2007   Qualifier: Diagnosis of  By: Lenn Cal    . Hyperlipidemia   . Hypertension `   during treatment with Geodon  . Obesity   . PTSD (post-traumatic stress disorder)   . Pulmonary hypertension ([place]) 05/02/2012   Patient needs repeat echo in 06/2012     . Schizoaffective disorder    requiring multiple psychiatric admissions    Patient Active Problem List   Diagnosis Date Noted  . Oral candidiasis 05/16/2017  . Immunity status testing 04/17/2017  . Abdominal pain 03/28/2017  . Viral wart on finger 10/08/2016  . Diabetes mellitus type II, controlled ([place]) 12/19/2014  . Palpitations 12/15/2014  . Encounter for health education 10/24/2014  . Left knee pain 08/25/2014  . Vision changes 08/12/2014  . Pulsatile tinnitus 08/12/2014  . IIH (idiopathic intracranial hypertension) 08/12/2014  . Seasonal allergies 02/23/2014  . Hypokalemia 12/25/2013  . Diarrhea 10/14/2013  . Viral URI 05/12/2013  . Daytime hypersomnolence 12/09/2012  . COPD (chronic obstructive pulmonary disease) ([place]) 10/20/2012  . GERD (gastroesophageal reflux disease) 10/15/2012  . Preventative health care 10/15/2012  . Pulmonary hypertension ([place]) 05/02/2012  . Diastolic heart failure ([place]) 02/07/2012  . IDA (iron deficiency anemia) 11/06/2011  . Arteriosclerotic cardiovascular disease (ASCVD)   . TOBACCO ABUSE 09/27/2009  . Hyperlipidemia 07/22/2007  . Obesity 07/22/2007  . Bipolar 1 disorder ([place]) 07/22/2007  . Resistant hypertension 07/22/2007  . Diabetes mellitus type 2, controlled ([place]) 09/28/2002    Past Surgical History:  Procedure Laterality Date  . COLONOSCOPY  01/2006   internal hemorrhoids  . COLONOSCOPY  01/10/2012   Dr. Rourk:Single anal canal hemorrhoidal tag likely source of  trivial hematochezia; right-sided colonic diverticulosis  . DILATION AND  CURETTAGE, DIAGNOSTIC / THERAPEUTIC  1992  . ESOPHAGEAL DILATION N/A 08/18/2015   Procedure: ESOPHAGEAL DILATION;  Surgeon: Daneil Dolin, MD;  [facility];  Service: Endoscopy;  Laterality: N/A;  . ESOPHAGOGASTRODUODENOSCOPY  09/16/08   Dr. Trevor Iha hiatal hernia/excoriations involving the cardia and mucosa consistent with trauma, antral erosions  of linear petechiae ? gastritis versus  early gastric antral vascular  ectasia.Marland Kitchen biopsy showed reactive gastropathy. No H. pylori.  . ESOPHAGOGASTRODUODENOSCOPY  09/2007   Dr. Evalee Mutton ring, dilated to 48 French Maloney dilator, small hiatal hernia, antral erosions, biopsies reactive gastropathy.  . ESOPHAGOGASTRODUODENOSCOPY (EGD) WITH PROPOFOL N/A 12/17/2013   HUT:[id number] antral erosions and petechiae. Small hiatal hernia. No endoscopic explanation for patient's symptoms  . ESOPHAGOGASTRODUODENOSCOPY (EGD) WITH PROPOFOL N/A 08/18/2015   Procedure: ESOPHAGOGASTRODUODENOSCOPY (EGD) WITH PROPOFOL;  Surgeon: Daneil Dolin, MD;  [facility];  Service: Endoscopy;  Laterality: N/A;  0845  . SAVORY DILATION  07/17/2011   [facility] sedation-->distal esophageal stricture s/p dilation, chronic gastritis, multiple ulcers in stomach. no h.pylori    OB History    Gravida Para Term Preterm AB Living   _0 SAB TAB Ectopic Multiple Live Births     1     2       Home Medications    Prior to Admission medications   Medication Sig Start Date End Date Taking? Authorizing Provider  acetaminophen (TYLENOL) 500 MG tablet Take 1,000 mg by mouth every 6 (six) hours as needed for mild pain.   Yes [provider]  albuterol (VENTOLIN HFA) 108 (90 Base) MCG/ACT inhaler INHALE 2 PUFFS 3 TIMES DAILY AS NEEDED FOR WHEEZING OR SHORTNESS OF BREATH. 05/16/17  Yes Tawny Asal, MD  amLODipine (NORVASC) 10 MG tablet Take 1 tablet (10 mg total) by mouth daily. 05/16/17 05/16/18 Yes Tawny Asal, MD  benzonatate (TESSALON) 100 MG capsule Take 1 capsule (100 mg total) by mouth 3 (three) times daily as needed for cough. 04/27/17  Yes Francine Graven, DO  dextromethorphan-guaiFENesin [facility] DM) 30-600 MG 12hr tablet Take 2 tablets by mouth daily as needed for cough (cold, congestion, etc).   Yes [provider]  esomeprazole (NEXIUM) 40 MG capsule Take 1 capsule (40 mg total) by mouth daily. 05/16/17  Yes Tawny Asal, MD   ferrous sulfate 325 (65 FE) MG tablet Take 1 tablet (325 mg total) by mouth every other day. 05/16/17  Yes Tawny Asal, MD  Insulin Glargine (LANTUS SOLOSTAR) 100 UNIT/ML Solostar Pen Inject 18 Units into the skin daily at 10 pm. 05/16/17  Yes Tawny Asal, MD  Insulin Pen Needle (PEN NEEDLES) 31G X 5 MM MISC Inject 18 Units into the skin at bedtime. Use to inject insulin daily at bedtime 05/16/17  Yes Tawny Asal, MD  loratadine (CLARITIN) 10 MG tablet Take 1 tablet (10 mg total) by mouth daily. 05/16/17  Yes Tawny Asal, MD  losartan (COZAAR) 100 MG tablet Take 1 tablet (100 mg total) by mouth daily. 05/16/17  Yes Tawny Asal, MD  lovastatin (MEVACOR) 40 MG tablet Take 1 tablet (40 mg total) by mouth daily. 05/16/17 08/14/17 Yes Tawny Asal, MD  metFORMIN (GLUCOPHAGE) 1000 MG tablet TAKE (1) TABLET BY MOUTH TWICE DAILY WITH MEALS. 05/16/17  Yes Tawny Asal, MD  metoprolol tartrate (LOPRESSOR) 100 MG tablet Take 1 tablet (100 mg total) by mouth 2 (two) times daily. 05/16/17  Yes Tawny Asal, MD  spironolactone (ALDACTONE) 50 MG tablet Take 1 tablet (  50 mg total) by mouth daily. 05/16/17  Yes Tawny Asal, MD  ziprasidone (GEODON) 80 MG capsule Take 160 mg by mouth at bedtime.    Yes [provider]  dicyclomine (BENTYL) 10 MG capsule Take 1 capsule (10 mg total) by mouth 4 (four) times daily as needed for spasms. 04/22/17 05/22/17  Carlis Stable, NP  doxycycline (VIBRAMYCIN) 100 MG capsule Take 1 capsule (100 mg total) by mouth 2 (two) times daily. 05/28/17   Tasia Catchings, Yitzchok Carriger V, PA-C  fluticasone (FLONASE) 50 MCG/ACT nasal spray Place 2 sprays into both nostrils daily. 05/28/17   Tasia Catchings, Linnette Panella V, PA-C  ipratropium (ATROVENT) 0.06 % nasal spray Place 2 sprays into both nostrils 4 (four) times daily. 05/28/17   Tasia Catchings, Valdemar Mcclenahan V, PA-C  ondansetron (ZOFRAN ODT) 4 MG disintegrating tablet Take 1 tablet (4 mg total) by mouth every 8 (eight) hours as needed for nausea or vomiting. 05/28/17   Ok Edwards, PA-C    Family  History Family History  Problem Relation Age of Onset  . Hypertension Mother   . Stroke Father        deceased at age 61  . Colon cancer Other   . Heart disease Sister   . Diabetes Unknown   . High Cholesterol Unknown   . Arthritis Unknown   . Anesthesia problems Neg Hx   . Hypotension Neg Hx   . Malignant hyperthermia Neg Hx   . Pseudochol deficiency Neg Hx     Social History Social History   Tobacco Use  . Smoking status: Current Some Day Smoker    Packs/day: 1.00    Years: 30.00    Pack years: 30.00    Types: Cigarettes    Start date: 05/18/2012    Last attempt to quit: 03/28/2017    Years since quitting: 0.1  . Smokeless tobacco: Never Used  Substance Use Topics  . Alcohol use: No    Alcohol/week: 0.0 oz    Comment: hx of ETOH abuse  . Drug use: No     Allergies   Cephalexin; Metronidazole; Orange; Other; Shrimp [shellfish allergy]; Penicillins; Sulfonamide derivatives; Glipizide; Ace inhibitors; and Sulfamethoxazole-trimethoprim   Review of Systems Review of Systems  Reason unable to perform ROS: See HPI as above.     Physical Exam Triage Vital Signs ED Triage Vitals [05/28/17 1809]  Enc Vitals Group     BP (!) 150/82     Pulse Rate 85     Resp      Temp 99.4 F (37.4 C)     Temp Source Oral     SpO2 99 %     Weight      Height      Head Circumference      Peak Flow      Pain Score      Pain Loc      Pain Edu?      Excl. in [place]?    No data found.  Updated Vital Signs BP (!) 150/82 (BP Location: Left Arm)   Pulse 85   Temp 99.4 F (37.4 C) (Oral)   SpO2 99%   Physical Exam  Constitutional: She is oriented to person, place, and time. She appears well-developed and well-nourished. No distress.  HENT:  Head: Normocephalic and atraumatic.  Right Ear: Tympanic membrane, external ear and ear canal normal. Tympanic membrane is not erythematous and not bulging.  Left Ear: Tympanic membrane, external ear and ear canal normal. Tympanic membrane  is not erythematous  and not bulging.  Nose: Mucosal edema and rhinorrhea present. Right sinus exhibits no maxillary sinus tenderness and no frontal sinus tenderness. Left sinus exhibits no maxillary sinus tenderness and no frontal sinus tenderness.  Mouth/Throat: Uvula is midline, oropharynx is clear and moist and mucous membranes are normal.  Eyes: Conjunctivae are normal. Pupils are equal, round, and reactive to light.  Neck: Normal range of motion. Neck supple.  Cardiovascular: Normal rate, regular rhythm and normal heart sounds. Exam reveals no gallop and no friction rub.  No murmur heard. Pulmonary/Chest: Effort normal.  Mild inspiratory and expiratory wheezing at the right lower field.  No obvious rhonchi or rales.  Abdominal: Soft. Bowel sounds are normal. There is no tenderness. There is no rigidity, no rebound, no guarding, no tenderness at McBurney's point and negative Murphy's sign.  Lymphadenopathy:    She has no cervical adenopathy.  Neurological: She is alert and oriented to person, place, and time.  Skin: Skin is warm and dry.  Psychiatric: She has a normal mood and affect. Her behavior is normal. Judgment normal.    UC Treatments / Results  Labs (all labs ordered are listed, but only abnormal results are displayed) Labs Reviewed  GLUCOSE, CAPILLARY - Abnormal; Notable for the following components:      Result Value   Glucose-Capillary 141 (*)    All other components within normal limits    Lab Results  Component Value Date   HGBA1C 8.1 05/16/2017    EKG  EKG Interpretation None       Radiology Dg Chest 2 View  Result Date: 05/28/2017 CLINICAL DATA:  Cough and congestion for 1 month. Hypertension. Diabetes. Smoker. EXAM: CHEST  2 VIEW COMPARISON:  04/27/2017 FINDINGS: Midline trachea.  Normal heart size and mediastinal contours. Sharp costophrenic angles.  No pneumothorax.  Clear lungs. IMPRESSION: No active cardiopulmonary disease. Electronically Signed   By:  Abigail Miyamoto M.D.   On: 05/28/2017 19:01    Procedures Procedures (including critical care time)  Medications Ordered in UC Medications  ipratropium-albuterol (DUONEB) 0.5-2.5 (3) MG/3ML nebulizer solution 3 mL (3 mLs Nebulization Given 05/28/17 1858)     Initial Impression / Assessment and Plan / UC Course  I have reviewed the triage vital signs and the nursing notes.  Pertinent labs & imaging results that were available during my care of the patient were reviewed by me and considered in my medical decision making (see chart for details).    CXR without pneumonia. Discussed with patient new onset symptoms could be viral illness superimposing bronchitis. However, given diarrhea, should defer abx treatment for now. Patient expresses understanding, but would like to proceed with antibiotic treatment. Discussed risks and benefits, including worsening diarrhea, which patient expressed understanding and would like to proceed. Doxycycline as directed. Other symptomatic treatment discussed. Push fluids. Return precautions given. Patient expresses understanding and agrees to plan.  Case discussed with Dr Mannie Stabile, who agrees to plan.   Final Clinical Impressions(s) / UC Diagnoses   Final diagnoses:  Bronchitis    ED Discharge Orders        Ordered    fluticasone (FLONASE) 50 MCG/ACT nasal spray  Daily     05/28/17 1921    ipratropium (ATROVENT) 0.06 % nasal spray  4 times daily     05/28/17 1921    doxycycline (VIBRAMYCIN) 100 MG capsule  2 times daily     05/28/17 1921    ondansetron (ZOFRAN ODT) 4 MG disintegrating tablet  Every 8 hours PRN  05/28/17 1922       Ok Edwards, PA-C 05/28/17 2044

## 2017-05-28 NOTE — ED Triage Notes (Signed)
Patient called for Triage, no answer. 

## 2017-05-28 NOTE — Progress Notes (Unsigned)
Referring Provider: Ina Homes, MD Primary Care Physician:  Ina Homes, MD Primary GI:  Dr. Gala Romney  No chief complaint on file.   HPI:   [name] is a 52 y.o. female who presents follow-up on abdominal pain and diarrhea.  The patient was last seen in our office 03/28/2017 for the same.  EGD on file 08/18/2015 that was essentially normal and recommended continue Nexium 40 mg twice a day.  States that she was doing well overall, noted diarrhea for about 6 months which started 1 month after starting a new insulin product.  Also on metformin for more than 10 years.  Notes 2-3 watery/loose stools a day with no solidification.  Intermittent lower abdominal pain equitable to her menstrual cycle.  No other GI symptoms.  She was planning to change her insulin back at the start of the new year.  No obvious stressors.  Colonoscopy up-to-date 01/10/2012 which was essentially normal other than a hemorrhoid tag.  Recommended stool studies and serology.  Follow-up in 2 months.  CBC, CMP, lipase normal.  C. difficile and GI pathogen panel without obvious infection.  She was still having some diarrhea, had not switched her insulin back.  She was provided with Bentyl for symptomatic management until her follow-up appointment.  Also of note, recent occult blood test dated 04/27/2017 was negative.  Today she states   Past Medical History:  Diagnosis Date  . Alcohol abuse   . Anemia, iron deficiency   . Arteriosclerotic cardiovascular disease (ASCVD)    Minimal at cath in [facility].stress nuclear study in 8/08 with nl EF; neg stress echo in 2010  . Community acquired pneumonia 01/03/10, 05/2010, 04/2012   2011; with pleural effusion-[facility] acute resp failure; intubated in Jan 2014 (HMPV pneumonia)  . Depression   . Diabetes mellitus, type 2 ([place]) 2000   Onset in 2000; no insulin  . Diastolic dysfunction    grade 2 per echo 2011  . Dysphagia   . Gastroesophageal reflux  disease    Schatzki's ring  . History of alcohol abuse 07/22/2007   Qualifier: Diagnosis of  By: Lenn Cal    . Hyperlipidemia   . Hypertension `   during treatment with Geodon  . Obesity   . PTSD (post-traumatic stress disorder)   . Pulmonary hypertension ([place]) 05/02/2012   Patient needs repeat echo in 06/2012   . Schizoaffective disorder    requiring multiple psychiatric admissions    Past Surgical History:  Procedure Laterality Date  . COLONOSCOPY  01/2006   internal hemorrhoids  . COLONOSCOPY  01/10/2012   Dr. Rourk:Single anal canal hemorrhoidal tag likely source of  trivial hematochezia; right-sided colonic diverticulosis  . DILATION AND CURETTAGE, DIAGNOSTIC / THERAPEUTIC  1992  . ESOPHAGEAL DILATION N/A 08/18/2015   Procedure: ESOPHAGEAL DILATION;  Surgeon: Daneil Dolin, MD;  [facility];  Service: Endoscopy;  Laterality: N/A;  . ESOPHAGOGASTRODUODENOSCOPY  09/16/08   Dr. Trevor Iha hiatal hernia/excoriations involving the cardia and mucosa consistent with trauma, antral erosions  of linear petechiae ? gastritis versus early gastric antral vascular  ectasia.Marland Kitchen biopsy showed reactive gastropathy. No H. pylori.  . ESOPHAGOGASTRODUODENOSCOPY  09/2007   Dr. Evalee Mutton ring, dilated to 22 French Maloney dilator, small hiatal hernia, antral erosions, biopsies reactive gastropathy.  . ESOPHAGOGASTRODUODENOSCOPY (EGD) WITH PROPOFOL N/A 12/17/2013   IZT:[id number] antral erosions and petechiae. Small hiatal hernia. No endoscopic explanation for patient's symptoms  . ESOPHAGOGASTRODUODENOSCOPY (EGD) WITH PROPOFOL N/A 08/18/2015  Procedure: ESOPHAGOGASTRODUODENOSCOPY (EGD) WITH PROPOFOL;  Surgeon: Daneil Dolin, MD;  [facility];  Service: Endoscopy;  Laterality: N/A;  0845  . SAVORY DILATION  07/17/2011   [facility] sedation-->distal esophageal stricture s/p dilation, chronic gastritis, multiple ulcers in stomach. no h.pylori    Current Outpatient Medications    Medication Sig Dispense Refill  . acetaminophen (TYLENOL) 500 MG tablet Take 1,000 mg by mouth every 6 (six) hours as needed for mild pain.    Marland Kitchen albuterol (VENTOLIN HFA) 108 (90 Base) MCG/ACT inhaler INHALE 2 PUFFS 3 TIMES DAILY AS NEEDED FOR WHEEZING OR SHORTNESS OF BREATH. 1 Inhaler 3  . amLODipine (NORVASC) 10 MG tablet Take 1 tablet (10 mg total) by mouth daily. 90 tablet 2  . benzonatate (TESSALON) 100 MG capsule Take 1 capsule (100 mg total) by mouth 3 (three) times daily as needed for cough. 15 capsule 0  . dextromethorphan-guaiFENesin (MUCINEX DM) 30-600 MG 12hr tablet Take 2 tablets by mouth daily as needed for cough (cold, congestion, etc).    Marland Kitchen dicyclomine (BENTYL) 10 MG capsule Take 1 capsule (10 mg total) by mouth 4 (four) times daily as needed for spasms. 90 capsule 0  . esomeprazole (NEXIUM) 40 MG capsule Take 1 capsule (40 mg total) by mouth daily. 90 capsule 1  . ferrous sulfate 325 (65 FE) MG tablet Take 1 tablet (325 mg total) by mouth every other day. 60 tablet 1  . Insulin Glargine (LANTUS SOLOSTAR) 100 UNIT/ML Solostar Pen Inject 18 Units into the skin daily at 10 pm. 15 mL 11  . Insulin Pen Needle (PEN NEEDLES) 31G X 5 MM MISC Inject 18 Units into the skin at bedtime. Use to inject insulin daily at bedtime 100 each 11  . loratadine (CLARITIN) 10 MG tablet Take 1 tablet (10 mg total) by mouth daily. 90 tablet 3  . losartan (COZAAR) 100 MG tablet Take 1 tablet (100 mg total) by mouth daily. 90 tablet 3  . lovastatin (MEVACOR) 40 MG tablet Take 1 tablet (40 mg total) by mouth daily. 90 tablet 3  . metFORMIN (GLUCOPHAGE) 1000 MG tablet TAKE (1) TABLET BY MOUTH TWICE DAILY WITH MEALS. 180 tablet 3  . metoprolol tartrate (LOPRESSOR) 100 MG tablet Take 1 tablet (100 mg total) by mouth 2 (two) times daily. 180 tablet 3  . spironolactone (ALDACTONE) 50 MG tablet Take 1 tablet (50 mg total) by mouth daily. 90 tablet 3  . ziprasidone (GEODON) 80 MG capsule Take 160 mg by mouth at  bedtime.      No current facility-administered medications for this visit.     Allergies as of 05/28/2017 - Review Complete 05/16/2017  Allergen Reaction Noted  . Cephalexin  10/14/2013  . Metronidazole Shortness Of Breath and Swelling   . Orange Itching 03/27/2011  . Other Itching and Shortness Of Breath 03/27/2011  . Shrimp [shellfish allergy] Shortness Of Breath and Itching 03/27/2011  . Penicillins Hives and Swelling   . Sulfonamide derivatives Hives   . Glipizide Other (See Comments) 01/15/2011  . Ace inhibitors Cough 06/14/2016  . Sulfamethoxazole-trimethoprim Rash 02/27/2010    Family History  Problem Relation Age of Onset  . Hypertension Mother   . Stroke Father        deceased at age 82  . Colon cancer Other   . Heart disease Sister   . Diabetes Unknown   . High Cholesterol Unknown   . Arthritis Unknown   . Anesthesia problems Neg Hx   . Hypotension Neg  Hx   . Malignant hyperthermia Neg Hx   . Pseudochol deficiency Neg Hx     Social History   Socioeconomic History  . Marital status: Single    Spouse name: Not on file  . Number of children: 2  . Years of education: some colge  . Highest education level: Not on file  Social Needs  . Financial resource strain: Not on file  . Food insecurity - worry: Not on file  . Food insecurity - inability: Not on file  . Transportation needs - medical: Not on file  . Transportation needs - non-medical: Not on file  Occupational History  . Occupation: Disability  . Occupation: UNEMPLOYED    Employer: UNEMPLOYED  Tobacco Use  . Smoking status: Former Smoker    Packs/day: 1.00    Years: 30.00    Pack years: 30.00    Types: Cigarettes    Start date: 05/18/2012    Last attempt to quit: 03/28/2017    Years since quitting: 0.1  . Smokeless tobacco: Never Used  Substance and Sexual Activity  . Alcohol use: No    Alcohol/week: 0.0 oz    Comment: hx of ETOH abuse  . Drug use: No  . Sexual activity: No    Partners:  Male    Birth control/protection: None  Other Topics Concern  . 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 Eyes: Negative for vision changes.  ENT: Negative for hoarseness, difficulty swallowing , nasal congestion. CV: Negative for chest pain, angina, palpitations, dyspnea on exertion, peripheral edema.  Respiratory: Negative for dyspnea at rest, dyspnea on exertion, cough, sputum, wheezing.  GI: See history of present illness. GU:  Negative for dysuria, hematuria, urinary incontinence, urinary frequency, nocturnal urination.  MS: Negative for joint pain, low back pain.  Derm: Negative for rash or itching.  Neuro: Negative for weakness, abnormal sensation, seizure, frequent headaches, memory loss, confusion.  Psych: Negative for anxiety, depression, suicidal ideation, hallucinations.  Endo: Negative for unusual weight change.  Heme: Negative for bruising or bleeding. Allergy: Negative for rash or hives.   Physical Exam: There were no vitals taken for this visit. General:   Alert and oriented. Pleasant and cooperative. Well-nourished and well-developed.  Head:  Normocephalic and atraumatic. Eyes:  Without icterus, sclera clear and conjunctiva pink.  Ears:  Normal auditory acuity. Mouth:  No deformity or lesions, oral mucosa pink.  Throat/Neck:  Supple, without mass or thyromegaly. Cardiovascular:  S1, S2 present without murmurs appreciated. Normal pulses noted. Extremities without clubbing or edema. Respiratory:  Clear to auscultation bilaterally. No wheezes, rales, or rhonchi. No distress.  Gastrointestinal:  +BS,  soft, non-tender and non-distended. No HSM noted. No guarding or rebound. No masses appreciated.  Rectal:  Deferred  Musculoskalatal:  Symmetrical without gross deformities. Normal posture. Skin:  Intact without significant lesions or rashes. Neurologic:  Alert and oriented x4;  grossly normal neurologically. Psych:  Alert and cooperative. Normal mood and affect. Heme/Lymph/Immune: No significant cervical adenopathy. No excessive bruising noted.    05/28/2017 8:09 AM   Disclaimer: This note was dictated with voice recognition software. Similar sounding words can inadvertently be  transcribed and may not be corrected upon review.

## 2017-06-04 ENCOUNTER — Ambulatory Visit: Payer: Medicare Other | Admitting: Podiatry

## 2017-07-30 ENCOUNTER — Encounter: Payer: Self-pay | Admitting: Internal Medicine

## 2017-07-30 ENCOUNTER — Telehealth: Payer: Self-pay | Admitting: Nurse Practitioner

## 2017-07-30 ENCOUNTER — Ambulatory Visit: Payer: Medicaid Other | Admitting: Nurse Practitioner

## 2017-07-30 NOTE — Telephone Encounter (Signed)
PATIENT WAS A NO SHOW AND LETTER SENT  °

## 2017-07-30 NOTE — Telephone Encounter (Signed)
Noted  

## 2017-08-08 ENCOUNTER — Encounter: Payer: Self-pay | Admitting: Internal Medicine

## 2017-08-08 ENCOUNTER — Ambulatory Visit (INDEPENDENT_AMBULATORY_CARE_PROVIDER_SITE_OTHER): Payer: Medicare Other | Admitting: Internal Medicine

## 2017-08-08 VITALS — BP 156/88 | HR 65 | Wt 199.4 lb

## 2017-08-08 DIAGNOSIS — E876 Hypokalemia: Secondary | ICD-10-CM | POA: Diagnosis not present

## 2017-08-08 DIAGNOSIS — E119 Type 2 diabetes mellitus without complications: Secondary | ICD-10-CM | POA: Diagnosis not present

## 2017-08-08 DIAGNOSIS — R197 Diarrhea, unspecified: Secondary | ICD-10-CM | POA: Diagnosis not present

## 2017-08-08 DIAGNOSIS — D509 Iron deficiency anemia, unspecified: Secondary | ICD-10-CM | POA: Diagnosis not present

## 2017-08-08 DIAGNOSIS — Z794 Long term (current) use of insulin: Secondary | ICD-10-CM | POA: Diagnosis not present

## 2017-08-08 DIAGNOSIS — N915 Oligomenorrhea, unspecified: Secondary | ICD-10-CM

## 2017-08-08 DIAGNOSIS — K5790 Diverticulosis of intestine, part unspecified, without perforation or abscess without bleeding: Secondary | ICD-10-CM

## 2017-08-08 LAB — GLUCOSE, CAPILLARY: GLUCOSE-CAPILLARY: 136 mg/dL — AB (ref 65–99)

## 2017-08-08 LAB — POCT GLYCOSYLATED HEMOGLOBIN (HGB A1C): HEMOGLOBIN A1C: 7.1

## 2017-08-08 MED ORDER — INSULIN GLARGINE 100 UNIT/ML ~~LOC~~ SOLN: 18.0000 [IU] | Freq: Every day | SUBCUTANEOUS | 11 refills | Status: DC

## 2017-08-08 NOTE — Assessment & Plan Note (Signed)
Patient has a history of type 2 diabetes, currently taking Levemir 18 units nightly and metformin 1000 mg twice daily.  A1c today is 7.1.  She is requesting to change back to Lantus, as this is preferred with her insurance.  -- Change Levemir to Lantus 18 units nightly -- Continue metformin 1000 mg twice daily -- Follow-up PCP 3 months

## 2017-08-08 NOTE — Progress Notes (Signed)
   CC: DM follow up, hypokalemia   HPI:  [name] is a 52 y.o. female with past medical history outlined below here for follow up of her DM and hypokalemia. For the details of today's visit, please refer to the assessment and plan.  Past Medical History:  Diagnosis Date  . Alcohol abuse   . Anemia, iron deficiency   . Arteriosclerotic cardiovascular disease (ASCVD)    Minimal at cath in [facility].stress nuclear study in 8/08 with nl EF; neg stress echo in 2010  . Community acquired pneumonia 01/03/10, 05/2010, 04/2012   2011; with pleural effusion-[facility] acute resp failure; intubated in Jan 2014 (HMPV pneumonia)  . Depression   . Diabetes mellitus, type 2 ([place]) 2000   Onset in 2000; no insulin  . Diastolic dysfunction    grade 2 per echo 2011  . Dysphagia   . Gastroesophageal reflux disease    Schatzki's ring  . History of alcohol abuse 07/22/2007   Qualifier: Diagnosis of  By: Lenn Cal    . Hyperlipidemia   . Hypertension `   during treatment with Geodon  . Obesity   . PTSD (post-traumatic stress disorder)   . Pulmonary hypertension ([place]) 05/02/2012   Patient needs repeat echo in 06/2012   . Schizoaffective disorder    requiring multiple psychiatric admissions    Review of Systems  Constitutional: Positive for chills and malaise/fatigue.    Physical Exam:  Vitals:   08/08/17 1437  BP: (!) 156/88  Pulse: 65  Weight: 199 lb 6.4 oz (90.4 kg)    Constitutional: NAD, appears comfortable Cardiovascular: RRR, no murmurs, rubs, or gallops.  Pulmonary/Chest: CTAB, no wheezes, rales, or rhonchi.  Extremities: Warm and well perfused.  No edema.  Psychiatric: Normal mood and affect  Assessment & Plan:   See Encounters Tab for problem based charting.  Patient discussed with Dr. Evette Doffing

## 2017-08-08 NOTE — Assessment & Plan Note (Addendum)
Patient reports history of chronic hypokalemia frequently requiring potassium supplementation.  She was started on spironolactone for this without improvement.  She is endorsing symptoms of feeling cold and fatigued, which she feels is often a sign that either her ferritin or potassium is low.  Unclear reason for persistently low potassium.  She does endorse diarrhea/loose stool for the past year.  She was recently seen by GI for this and worked up with stool PCR studies and pancreatic fecal elastase that were negative / normal. She has not followed up with GI since.  She was treated symptomatically for IBS.  Other potential etiologies include RTA's, Bartter or Gitelman syndrome, or hyperaldosteronism.  We will recheck labs today.  If potassium is still low, she will require follow-up for further work-up. -- F/u BMP  ADDENDUM: Potassium persistently low, 3.4. Called patient with results. No need for supplementation at this time, but instructed patient to schedule follow up for further work up.

## 2017-08-08 NOTE — Assessment & Plan Note (Addendum)
Patient has a history of iron deficiency anemia.  She has been on chronic iron supplementation.  Reports she stopped her iron supplementation a few months ago but recently developed symptoms of fatigue and constantly feeling cold.  She reports this is frequently a sign that her iron is low. She restarted her iron supplements 2 days ago.  She has been worked up for this in the past.  Colonoscopy in 2013 revealed right-sided diverticulosis.  She reports intermittent mild GI bleeding, but has not noticed blood in the past few months.  She continues to menstruate, but reports this is very light and infrequent.  We will recheck labs today.  Continue with iron supplementation. -- F/u CBC, Ferritin -- Continue iron supplements   ADDENDUM: ferritin has increased from 11 -> 22.  Hemoglobin is stable at 11.6, MCV 79.  Instructed patient to continue iron supplements.

## 2017-08-08 NOTE — Patient Instructions (Addendum)
FOLLOW-UP INSTRUCTIONS When: 1 month or next available   For: PCP follow up What to bring: Mediations   [name],  It was a pleasure to meet you. I will call you with the results of your lab work. If your potassium is still low, I will call you in a prescription. We will also need to bring you back for further work up, to figure out why your potassium is persistently low.   I have changed your insulin back to lantus. Please continue to inject 18 units prior to bed and continue taking your metformin as previously prescribed. Follow up with your PCP in 1 month. If you have any questions or concerns, call our clinic at [phone] or after hours call [phone] and ask for the internal medicine resident on call. Thank you!  - Dr. Philipp Ovens

## 2017-08-09 LAB — BMP8+ANION GAP
ANION GAP: 15 mmol/L (ref 10.0–18.0)
BUN / CREAT RATIO: 11 (ref 9–23)
BUN: 9 mg/dL (ref 6–24)
CO2: 24 mmol/L (ref 20–29)
CREATININE: 0.81 mg/dL (ref 0.57–1.00)
Calcium: 9.5 mg/dL (ref 8.7–10.2)
Chloride: 101 mmol/L (ref 96–106)
GFR, EST AFRICAN AMERICAN: 99 mL/min/{1.73_m2} (ref >59)
GFR, EST NON AFRICAN AMERICAN: 86 mL/min/{1.73_m2} (ref >59)
Glucose: 120 mg/dL — ABNORMAL HIGH (ref 65–99)
POTASSIUM: 3.4 mmol/L — AB (ref 3.5–5.2)
SODIUM: 140 mmol/L (ref 134–144)

## 2017-08-09 LAB — CBC
Hematocrit: 37.5 % (ref 34.0–46.6)
Hemoglobin: 11.6 g/dL (ref 11.1–15.9)
MCH: 24.5 pg — AB (ref 26.6–33.0)
MCHC: 30.9 g/dL — AB (ref 31.5–35.7)
MCV: 79 fL (ref 79–97)
Platelets: 224 10*3/uL (ref 150–379)
RBC: 4.73 x10E6/uL (ref 3.77–5.28)
RDW: 18.3 % — ABNORMAL HIGH (ref 12.3–15.4)
WBC: 6.5 10*3/uL (ref 3.4–10.8)

## 2017-08-09 LAB — FERRITIN: FERRITIN: 22 ng/mL (ref 15–150)

## 2017-08-09 NOTE — Progress Notes (Signed)
Internal Medicine Clinic Attending  Case discussed with Dr. Philipp Ovens  at the time of the visit.  We reviewed the resident's history and exam and pertinent patient test results.  I agree with the assessment, diagnosis, and plan of care documented in the resident's note.  With persistent hypokalemia I agree with plan for further workup with serum and urine studies.

## 2017-08-12 ENCOUNTER — Telehealth: Payer: Self-pay | Admitting: *Deleted

## 2017-08-12 ENCOUNTER — Telehealth: Payer: Self-pay

## 2017-08-12 DIAGNOSIS — E119 Type 2 diabetes mellitus without complications: Secondary | ICD-10-CM

## 2017-08-12 DIAGNOSIS — Z794 Long term (current) use of insulin: Principal | ICD-10-CM

## 2017-08-12 MED ORDER — INSULIN GLARGINE 100 UNITS/ML SOLOSTAR PEN: 18.0000 [IU] | PEN_INJECTOR | Freq: Every day | SUBCUTANEOUS | 1 refills | Status: DC

## 2017-08-12 NOTE — Telephone Encounter (Signed)
Needs to speak with a nurse about insulin. Please call pt back.  

## 2017-08-12 NOTE — Telephone Encounter (Signed)
Lantus prescription changed to pens, 18 units QHS. Sent to [facility] in [place].

## 2017-08-12 NOTE — Telephone Encounter (Signed)
Pt calls and would like lantus script changed to PENS, she does not use vials, also she would like a 90 day supply at a time of the lantus pens, thanks!

## 2017-08-12 NOTE — Telephone Encounter (Signed)
rtc to pt, sent request

## 2017-08-14 ENCOUNTER — Telehealth: Payer: Self-pay | Admitting: Internal Medicine

## 2017-08-14 MED ORDER — INSULIN GLARGINE 100 UNIT/ML SOLOSTAR PEN: 18.0000 [IU] | PEN_INJECTOR | Freq: Every day | SUBCUTANEOUS | 1 refills | Status: DC

## 2017-08-14 NOTE — Telephone Encounter (Signed)
Ok can you please call in the prescription? Lantus pen 18 units QHS, 1 box with 1 refill. Thank you.

## 2017-08-14 NOTE — Telephone Encounter (Signed)
   Reason for call:   I received a call from [name] at 8:02 PM indicating that she has been out of insulin for 3 days and requesting a refill of her Lantus as pens, not vials.   Pertinent Data:   Patient with T2DM, seen in [facility] on 08/08/17. Insulin was changed from levemir to lantus 18 units qhs based on insurance preference. Initial Rx was for vials which patient states she does not know how to use.   Assessment / Plan / Recommendations:   Sent prescription for Lantus 18 units qhs to patient's preferred pharmacy [facility] in [place], [place] on [address]. Advised patient to call clinic if still having further issues obtaining insulin.  As always, pt is advised that if symptoms worsen or new symptoms arise, they should go to an urgent care facility or to to ER for further evaluation.   Rachel Finders, MD   08/14/2017, 8:12 PM

## 2017-08-15 ENCOUNTER — Telehealth: Payer: Self-pay | Admitting: Internal Medicine

## 2017-08-15 NOTE — Telephone Encounter (Signed)
Pt called to say that she was out of Nexium and she needs the Rx changed to twice a day and not once a day. She uses [facility] on [address].

## 2017-08-15 NOTE — Telephone Encounter (Signed)
Rx for lantus pens sent by PCP after hours. Hubbard Hartshorn, RN, BSN

## 2017-08-15 NOTE — Telephone Encounter (Signed)
Lmom, waiting on a return call.  

## 2017-08-19 ENCOUNTER — Other Ambulatory Visit: Payer: Self-pay | Admitting: *Deleted

## 2017-08-19 MED ORDER — ESOMEPRAZOLE MAGNESIUM 40 MG PO CPDR: 40.0000 mg | DELAYED_RELEASE_CAPSULE | Freq: Every day | ORAL | 0 refills | Status: DC

## 2017-08-23 MED ORDER — ESOMEPRAZOLE MAGNESIUM 40 MG PO CPDR: 40.0000 mg | DELAYED_RELEASE_CAPSULE | Freq: Two times a day (BID) | ORAL | 5 refills | Status: DC

## 2017-08-23 NOTE — Telephone Encounter (Signed)
Routing to the refill box. Per last EGD and EG last ov note- pt should be on Nexium bid.

## 2017-08-23 NOTE — Addendum Note (Signed)
Addended by: Mahala Menghini on: 08/23/2017 01:23 PM   Modules accepted: Orders

## 2017-08-25 ENCOUNTER — Encounter (HOSPITAL_COMMUNITY): Payer: Self-pay

## 2017-08-25 ENCOUNTER — Emergency Department (HOSPITAL_COMMUNITY): Payer: Medicare Other

## 2017-08-25 ENCOUNTER — Other Ambulatory Visit: Payer: Self-pay

## 2017-08-25 ENCOUNTER — Emergency Department (HOSPITAL_COMMUNITY)
Admission: EM | Admit: 2017-08-25 | Discharge: 2017-08-26 | Disposition: A | Discharge status: Other Acute Inpatient Hospital | Payer: Medicare Other | Attending: Emergency Medicine | Admitting: Emergency Medicine

## 2017-08-25 DIAGNOSIS — I5030 Unspecified diastolic (congestive) heart failure: Secondary | ICD-10-CM | POA: Insufficient documentation

## 2017-08-25 DIAGNOSIS — F17210 Nicotine dependence, cigarettes, uncomplicated: Secondary | ICD-10-CM | POA: Insufficient documentation

## 2017-08-25 DIAGNOSIS — F29 Unspecified psychosis not due to a substance or known physiological condition: Secondary | ICD-10-CM | POA: Insufficient documentation

## 2017-08-25 DIAGNOSIS — J449 Chronic obstructive pulmonary disease, unspecified: Secondary | ICD-10-CM | POA: Insufficient documentation

## 2017-08-25 DIAGNOSIS — E119 Type 2 diabetes mellitus without complications: Secondary | ICD-10-CM | POA: Insufficient documentation

## 2017-08-25 DIAGNOSIS — F209 Schizophrenia, unspecified: Secondary | ICD-10-CM | POA: Insufficient documentation

## 2017-08-25 DIAGNOSIS — Z79899 Other long term (current) drug therapy: Secondary | ICD-10-CM | POA: Insufficient documentation

## 2017-08-25 DIAGNOSIS — Z794 Long term (current) use of insulin: Secondary | ICD-10-CM | POA: Insufficient documentation

## 2017-08-25 DIAGNOSIS — I110 Hypertensive heart disease with heart failure: Secondary | ICD-10-CM | POA: Insufficient documentation

## 2017-08-25 LAB — I-STAT BETA HCG BLOOD, ED (MC, WL, AP ONLY)

## 2017-08-25 LAB — ETHANOL: Alcohol, Ethyl (B): <10 mg/dL (ref <10)

## 2017-08-25 LAB — CBC
HEMATOCRIT: 39.6 % (ref 36.0–46.0)
Hemoglobin: 12.8 g/dL (ref 12.0–15.0)
MCH: 24.9 pg — AB (ref 26.0–34.0)
MCHC: 32.3 g/dL (ref 30.0–36.0)
MCV: 77 fL — AB (ref 78.0–100.0)
Platelets: 194 10*3/uL (ref 150–400)
RBC: 5.14 MIL/uL — ABNORMAL HIGH (ref 3.87–5.11)
RDW: 18.7 % — AB (ref 11.5–15.5)
WBC: 5.9 10*3/uL (ref 4.0–10.5)

## 2017-08-25 LAB — COMPREHENSIVE METABOLIC PANEL
ALBUMIN: 3.9 g/dL (ref 3.5–5.0)
ALK PHOS: 74 U/L (ref 38–126)
ALT: 11 U/L — ABNORMAL LOW (ref 14–54)
AST: 14 U/L — AB (ref 15–41)
Anion gap: 13 (ref 5–15)
BILIRUBIN TOTAL: 0.6 mg/dL (ref 0.3–1.2)
BUN: 7 mg/dL (ref 6–20)
CALCIUM: 9.3 mg/dL (ref 8.9–10.3)
CO2: 22 mmol/L (ref 22–32)
CREATININE: 0.89 mg/dL (ref 0.44–1.00)
Chloride: 103 mmol/L (ref 101–111)
GFR calc Af Amer: >60 mL/min (ref >60)
GLUCOSE: 143 mg/dL — AB (ref 65–99)
POTASSIUM: 2.9 mmol/L — AB (ref 3.5–5.1)
Sodium: 138 mmol/L (ref 135–145)
TOTAL PROTEIN: 6.9 g/dL (ref 6.5–8.1)

## 2017-08-25 LAB — CBG MONITORING, ED: GLUCOSE-CAPILLARY: 137 mg/dL — AB (ref 65–99)

## 2017-08-25 LAB — SALICYLATE LEVEL: Salicylate Lvl: <7 mg/dL (ref 2.8–30.0)

## 2017-08-25 LAB — ACETAMINOPHEN LEVEL: Acetaminophen (Tylenol), Serum: <10 ug/mL — ABNORMAL LOW (ref 10–30)

## 2017-08-25 MED ORDER — LOSARTAN POTASSIUM 50 MG PO TABS
100.0000 mg | ORAL_TABLET | Freq: Every day | ORAL | Status: DC
  Administered 2017-08-26: 100 mg via ORAL
  Filled 2017-08-25: qty 2

## 2017-08-25 MED ORDER — ALBUTEROL SULFATE HFA 108 (90 BASE) MCG/ACT IN AERS: 1.0000 | INHALATION_SPRAY | Freq: Four times a day (QID) | RESPIRATORY_TRACT | Status: DC | PRN

## 2017-08-25 MED ORDER — INSULIN GLARGINE 100 UNIT/ML ~~LOC~~ SOLN
18.0000 [IU] | Freq: Every day | SUBCUTANEOUS | Status: DC
  Administered 2017-08-25: 18 [IU] via SUBCUTANEOUS
  Filled 2017-08-25 (×2): qty 0.18

## 2017-08-25 MED ORDER — POTASSIUM CHLORIDE CRYS ER 20 MEQ PO TBCR
40.0000 meq | EXTENDED_RELEASE_TABLET | Freq: Once | ORAL | Status: AC
  Administered 2017-08-25: 40 meq via ORAL
  Filled 2017-08-25: qty 2

## 2017-08-25 MED ORDER — METOPROLOL TARTRATE 25 MG PO TABS
100.0000 mg | ORAL_TABLET | Freq: Two times a day (BID) | ORAL | Status: DC
  Administered 2017-08-25 – 2017-08-26 (×2): 100 mg via ORAL
  Filled 2017-08-25 (×2): qty 4

## 2017-08-25 MED ORDER — ZIPRASIDONE HCL 80 MG PO CAPS: 160.0000 mg | ORAL_CAPSULE | Freq: Every day | ORAL | Status: DC

## 2017-08-25 MED ORDER — ACETAMINOPHEN 325 MG PO TABS
650.0000 mg | ORAL_TABLET | ORAL | Status: DC | PRN
  Administered 2017-08-26: 650 mg via ORAL
  Filled 2017-08-25: qty 2

## 2017-08-25 MED ORDER — ALUM & MAG HYDROXIDE-SIMETH 200-200-20 MG/5ML PO SUSP: 30.0000 mL | Freq: Four times a day (QID) | ORAL | Status: DC | PRN

## 2017-08-25 MED ORDER — AMLODIPINE BESYLATE 5 MG PO TABS
10.0000 mg | ORAL_TABLET | Freq: Every day | ORAL | Status: DC
  Administered 2017-08-26: 10 mg via ORAL
  Filled 2017-08-25: qty 2

## 2017-08-25 MED ORDER — PANTOPRAZOLE SODIUM 40 MG PO TBEC
40.0000 mg | DELAYED_RELEASE_TABLET | Freq: Every day | ORAL | Status: DC
  Administered 2017-08-26: 40 mg via ORAL
  Filled 2017-08-25: qty 1

## 2017-08-25 MED ORDER — PRAVASTATIN SODIUM 10 MG PO TABS
10.0000 mg | ORAL_TABLET | Freq: Every day | ORAL | Status: DC
  Filled 2017-08-25: qty 1

## 2017-08-25 MED ORDER — FERROUS SULFATE 325 (65 FE) MG PO TABS
325.0000 mg | ORAL_TABLET | ORAL | Status: DC
  Administered 2017-08-26: 325 mg via ORAL
  Filled 2017-08-25: qty 1

## 2017-08-25 MED ORDER — METFORMIN HCL 500 MG PO TABS
1000.0000 mg | ORAL_TABLET | Freq: Two times a day (BID) | ORAL | Status: DC
  Administered 2017-08-26 (×2): 1000 mg via ORAL
  Filled 2017-08-25 (×2): qty 2

## 2017-08-25 MED ORDER — ONDANSETRON HCL 4 MG PO TABS: 4.0000 mg | ORAL_TABLET | Freq: Three times a day (TID) | ORAL | Status: DC | PRN

## 2017-08-25 MED ORDER — ZOLPIDEM TARTRATE 5 MG PO TABS: 5.0000 mg | ORAL_TABLET | Freq: Every evening | ORAL | Status: DC | PRN

## 2017-08-25 MED ORDER — LORATADINE 10 MG PO TABS
10.0000 mg | ORAL_TABLET | Freq: Every day | ORAL | Status: DC
  Administered 2017-08-26: 10 mg via ORAL
  Filled 2017-08-25: qty 1

## 2017-08-25 MED ORDER — SPIRONOLACTONE 50 MG PO TABS
50.0000 mg | ORAL_TABLET | Freq: Every day | ORAL | Status: DC
  Administered 2017-08-26: 50 mg via ORAL
  Filled 2017-08-25: qty 1
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
## 2017-08-25 NOTE — ED Notes (Signed)
Pt stating to this RN and PA that she is the daughter of [place] and was sent to the earth to judge humans. Pt questioning nurse about what she should do with mankind as the daughter of [place].

## 2017-08-25 NOTE — ED Notes (Signed)
Belongings inventoried and placed in locker 2. Pt valuables placed with security.
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
## 2017-08-25 NOTE — ED Notes (Signed)
ED Provider at bedside. 
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
## 2017-08-25 NOTE — ED Triage Notes (Signed)
Pt here with her mother who states that the pt is off her psychiatric medications for an unknown time. When this RN asked the pt her name she stated "I am the daughter of z[place], I created you, therefore you must worship me because I created you" Pt's mother states that every time the pt goes back to school she gets off of her medications and this happens. Pt hypertensive, has hx of same. Followed commands for this RN to get VS. When asked if the pt was having thoughts of hurting herself/others she said "Yes, mankind"

## 2017-08-25 NOTE — ED Notes (Signed)
Pt talking out loud to Father stating that she created [name] and if the staff makes her change into scrubs and take her belonings she will tear down the hospital and destroy [name] all. Pt stating "Come get me father, Marcene Brawn, Gershon Mussel, they are trying to destroy and humiliate me, you cannot let them do this to me, we are the Father's only begotten children. Take ne out of this body and bring me back to you father."

## 2017-08-26 ENCOUNTER — Other Ambulatory Visit: Payer: Self-pay

## 2017-08-26 ENCOUNTER — Encounter (HOSPITAL_COMMUNITY): Payer: Self-pay | Admitting: *Deleted

## 2017-08-26 ENCOUNTER — Inpatient Hospital Stay (HOSPITAL_COMMUNITY)
Admission: AD | Admit: 2017-08-26 | Discharge: 2017-08-28 | DRG: 885 | Disposition: A | Discharge status: Home / Self Care | Payer: Medicare Other | Source: Intra-hospital | Attending: Psychiatry | Admitting: Psychiatry

## 2017-08-26 DIAGNOSIS — F250 Schizoaffective disorder, bipolar type: Secondary | ICD-10-CM | POA: Diagnosis present

## 2017-08-26 DIAGNOSIS — Z794 Long term (current) use of insulin: Secondary | ICD-10-CM

## 2017-08-26 DIAGNOSIS — Z91013 Allergy to seafood: Secondary | ICD-10-CM | POA: Diagnosis not present

## 2017-08-26 DIAGNOSIS — F5105 Insomnia due to other mental disorder: Secondary | ICD-10-CM | POA: Diagnosis present

## 2017-08-26 DIAGNOSIS — E785 Hyperlipidemia, unspecified: Secondary | ICD-10-CM | POA: Diagnosis present

## 2017-08-26 DIAGNOSIS — Z881 Allergy status to other antibiotic agents status: Secondary | ICD-10-CM

## 2017-08-26 DIAGNOSIS — Z882 Allergy status to sulfonamides status: Secondary | ICD-10-CM | POA: Diagnosis not present

## 2017-08-26 DIAGNOSIS — D509 Iron deficiency anemia, unspecified: Secondary | ICD-10-CM | POA: Diagnosis present

## 2017-08-26 DIAGNOSIS — I5032 Chronic diastolic (congestive) heart failure: Secondary | ICD-10-CM | POA: Diagnosis present

## 2017-08-26 DIAGNOSIS — E7801 Familial hypercholesterolemia: Secondary | ICD-10-CM | POA: Diagnosis present

## 2017-08-26 DIAGNOSIS — F419 Anxiety disorder, unspecified: Secondary | ICD-10-CM | POA: Diagnosis not present

## 2017-08-26 DIAGNOSIS — F4310 Post-traumatic stress disorder, unspecified: Secondary | ICD-10-CM | POA: Diagnosis present

## 2017-08-26 DIAGNOSIS — Z880 Allergy status to penicillin: Secondary | ICD-10-CM | POA: Diagnosis not present

## 2017-08-26 DIAGNOSIS — K219 Gastro-esophageal reflux disease without esophagitis: Secondary | ICD-10-CM | POA: Diagnosis present

## 2017-08-26 DIAGNOSIS — Z91018 Allergy to other foods: Secondary | ICD-10-CM | POA: Diagnosis not present

## 2017-08-26 DIAGNOSIS — Z9114 Patient's other noncompliance with medication regimen: Secondary | ICD-10-CM | POA: Diagnosis not present

## 2017-08-26 DIAGNOSIS — F1011 Alcohol abuse, in remission: Secondary | ICD-10-CM | POA: Diagnosis not present

## 2017-08-26 DIAGNOSIS — E119 Type 2 diabetes mellitus without complications: Secondary | ICD-10-CM | POA: Diagnosis present

## 2017-08-26 DIAGNOSIS — I2510 Atherosclerotic heart disease of native coronary artery without angina pectoris: Secondary | ICD-10-CM | POA: Diagnosis present

## 2017-08-26 DIAGNOSIS — F17210 Nicotine dependence, cigarettes, uncomplicated: Secondary | ICD-10-CM | POA: Diagnosis present

## 2017-08-26 DIAGNOSIS — Z800 Family history of malignant neoplasm of digestive organs: Secondary | ICD-10-CM

## 2017-08-26 DIAGNOSIS — J449 Chronic obstructive pulmonary disease, unspecified: Secondary | ICD-10-CM | POA: Diagnosis present

## 2017-08-26 DIAGNOSIS — Z91419 Personal history of unspecified adult abuse: Secondary | ICD-10-CM | POA: Diagnosis not present

## 2017-08-26 DIAGNOSIS — Z8249 Family history of ischemic heart disease and other diseases of the circulatory system: Secondary | ICD-10-CM

## 2017-08-26 DIAGNOSIS — G4700 Insomnia, unspecified: Secondary | ICD-10-CM | POA: Diagnosis not present

## 2017-08-26 DIAGNOSIS — Z915 Personal history of self-harm: Secondary | ICD-10-CM | POA: Diagnosis not present

## 2017-08-26 DIAGNOSIS — Z888 Allergy status to other drugs, medicaments and biological substances status: Secondary | ICD-10-CM

## 2017-08-26 DIAGNOSIS — Z560 Unemployment, unspecified: Secondary | ICD-10-CM | POA: Diagnosis not present

## 2017-08-26 DIAGNOSIS — I110 Hypertensive heart disease with heart failure: Secondary | ICD-10-CM | POA: Diagnosis present

## 2017-08-26 DIAGNOSIS — I10 Essential (primary) hypertension: Secondary | ICD-10-CM | POA: Diagnosis not present

## 2017-08-26 LAB — RAPID URINE DRUG SCREEN, HOSP PERFORMED
Amphetamines: NOT DETECTED
BARBITURATES: NOT DETECTED
BENZODIAZEPINES: NOT DETECTED
Cocaine: NOT DETECTED
Opiates: NOT DETECTED
Tetrahydrocannabinol: NOT DETECTED

## 2017-08-26 LAB — GLUCOSE, CAPILLARY: Glucose-Capillary: 238 mg/dL — ABNORMAL HIGH (ref 65–99)

## 2017-08-26 LAB — CBG MONITORING, ED: Glucose-Capillary: 187 mg/dL — ABNORMAL HIGH (ref 65–99)

## 2017-08-26 MED ORDER — ZIPRASIDONE MESYLATE 20 MG IM SOLR: 20.0000 mg | Freq: Once | INTRAMUSCULAR | Status: DC

## 2017-08-26 MED ORDER — METFORMIN HCL 500 MG PO TABS
1000.0000 mg | ORAL_TABLET | Freq: Two times a day (BID) | ORAL | Status: DC
  Administered 2017-08-27 – 2017-08-28 (×3): 1000 mg via ORAL
  Filled 2017-08-26 (×7): qty 2

## 2017-08-26 MED ORDER — PANTOPRAZOLE SODIUM 40 MG PO TBEC
40.0000 mg | DELAYED_RELEASE_TABLET | Freq: Every day | ORAL | Status: DC
  Administered 2017-08-27 – 2017-08-28 (×2): 40 mg via ORAL
  Filled 2017-08-26 (×4): qty 1

## 2017-08-26 MED ORDER — AMLODIPINE BESYLATE 10 MG PO TABS
10.0000 mg | ORAL_TABLET | Freq: Every day | ORAL | Status: DC
  Administered 2017-08-27 – 2017-08-28 (×2): 10 mg via ORAL
  Filled 2017-08-26 (×4): qty 1

## 2017-08-26 MED ORDER — METOPROLOL TARTRATE 100 MG PO TABS
100.0000 mg | ORAL_TABLET | Freq: Two times a day (BID) | ORAL | Status: DC
  Administered 2017-08-26 – 2017-08-28 (×4): 100 mg via ORAL
  Filled 2017-08-26: qty 1
  Filled 2017-08-26: qty 4
  Filled 2017-08-26: qty 1
  Filled 2017-08-26: qty 4
  Filled 2017-08-26 (×4): qty 1
  Filled 2017-08-26: qty 4
  Filled 2017-08-26 (×2): qty 1
  Filled 2017-08-26 (×2): qty 4

## 2017-08-26 MED ORDER — PRAVASTATIN SODIUM 20 MG PO TABS
20.0000 mg | ORAL_TABLET | Freq: Every day | ORAL | Status: DC
  Administered 2017-08-27: 20 mg via ORAL
  Filled 2017-08-26 (×3): qty 1

## 2017-08-26 MED ORDER — ALBUTEROL SULFATE HFA 108 (90 BASE) MCG/ACT IN AERS
1.0000 | INHALATION_SPRAY | Freq: Four times a day (QID) | RESPIRATORY_TRACT | Status: DC | PRN
  Administered 2017-08-26: 2 via RESPIRATORY_TRACT
  Filled 2017-08-26: qty 6.7

## 2017-08-26 MED ORDER — ALUM & MAG HYDROXIDE-SIMETH 200-200-20 MG/5ML PO SUSP: 30.0000 mL | ORAL | Status: DC | PRN

## 2017-08-26 MED ORDER — TRAZODONE HCL 100 MG PO TABS
100.0000 mg | ORAL_TABLET | Freq: Every evening | ORAL | Status: DC | PRN
  Administered 2017-08-26 – 2017-08-27 (×2): 100 mg via ORAL
  Filled 2017-08-26 (×10): qty 1

## 2017-08-26 MED ORDER — INSULIN ASPART 100 UNIT/ML ~~LOC~~ SOLN
0.0000 [IU] | Freq: Three times a day (TID) | SUBCUTANEOUS | Status: DC
  Administered 2017-08-27: 2 [IU] via SUBCUTANEOUS
  Administered 2017-08-27: 3 [IU] via SUBCUTANEOUS

## 2017-08-26 MED ORDER — ZIPRASIDONE HCL 60 MG PO CAPS
60.0000 mg | ORAL_CAPSULE | Freq: Two times a day (BID) | ORAL | Status: DC
  Administered 2017-08-27 – 2017-08-28 (×3): 60 mg via ORAL
  Filled 2017-08-26 (×7): qty 1

## 2017-08-26 MED ORDER — LOSARTAN POTASSIUM 50 MG PO TABS
100.0000 mg | ORAL_TABLET | Freq: Every day | ORAL | Status: DC
  Administered 2017-08-27 – 2017-08-28 (×2): 100 mg via ORAL
  Filled 2017-08-26 (×4): qty 2

## 2017-08-26 MED ORDER — ACETAMINOPHEN 500 MG PO TABS: 1000.0000 mg | ORAL_TABLET | Freq: Four times a day (QID) | ORAL | Status: DC | PRN

## 2017-08-26 MED ORDER — MAGNESIUM HYDROXIDE 400 MG/5ML PO SUSP: 30.0000 mL | Freq: Every day | ORAL | Status: DC | PRN

## 2017-08-26 MED ORDER — INSULIN GLARGINE 100 UNIT/ML ~~LOC~~ SOLN
18.0000 [IU] | Freq: Every day | SUBCUTANEOUS | Status: DC
  Administered 2017-08-26 – 2017-08-27 (×2): 18 [IU] via SUBCUTANEOUS

## 2017-08-26 MED ORDER — ZIPRASIDONE HCL 20 MG PO CAPS
80.0000 mg | ORAL_CAPSULE | Freq: Two times a day (BID) | ORAL | Status: DC
  Filled 2017-08-26: qty 4

## 2017-08-26 MED ORDER — ZIPRASIDONE MESYLATE 20 MG IM SOLR: 20.0000 mg | Freq: Four times a day (QID) | INTRAMUSCULAR | Status: DC | PRN

## 2017-08-26 MED ORDER — SPIRONOLACTONE 50 MG PO TABS
50.0000 mg | ORAL_TABLET | Freq: Every day | ORAL | Status: DC
  Administered 2017-08-27 – 2017-08-28 (×2): 50 mg via ORAL
  Filled 2017-08-26 (×4): qty 1

## 2017-08-26 NOTE — Tx Team (Signed)
Initial Treatment Plan 08/26/2017 8:00 PM [name] KFM:[id number]    PATIENT STRESSORS: Marital or family conflict Traumatic event   PATIENT STRENGTHS: Ability for insight Average or above average intelligence Capable of independent living General fund of knowledge   PATIENT IDENTIFIED PROBLEMS: Psychosis "I have not been sleeping well" "I just want to get a good night's sleep and hopefully I can go home tomorrow"                     DISCHARGE CRITERIA:  Ability to meet basic life and health needs Improved stabilization in mood, thinking, and/or behavior Verbal commitment to aftercare and medication compliance  PRELIMINARY DISCHARGE PLAN: Attend aftercare/continuing care group Return to previous living arrangement  PATIENT/FAMILY INVOLVEMENT: This treatment plan has been presented to and reviewed with the patient, [name], and/or family member, .  The patient and family have been given the opportunity to ask questions and make suggestions.  [place], [place], [place] 08/26/2017, 8:00 PM

## 2017-08-26 NOTE — ED Notes (Signed)
Pt out to desk again asking to go home. "I'm not going to hurt all of mankind if you all just let me go". Advised pt that this RN will go over her chart and see if there is a plan of care yet and will come in her room to speak with her.

## 2017-08-26 NOTE — ED Notes (Addendum)
Pt transferred to [facility] with [employer]; all belongs, secured items and medications given to [employer]
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
## 2017-08-26 NOTE — Progress Notes (Signed)
Pt stated she was just exhausted and after  she gets some rest she should be fine

## 2017-08-26 NOTE — ED Notes (Addendum)
Pt states that she would like some tylenol due to "achey" pain to right leg.

## 2017-08-26 NOTE — Progress Notes (Addendum)
Pt accepted to [facility], Bed 500-1   Elmarie Shiley, PMHNP is the accepting provider.  Dr. Nancy Fetter is the attending provider.  Call report to [phone]  [facility] ED notified.   Pt is IVC   Pt may be transported by [employer] Pt scheduled to arrive at [facility] @5  PM  Romie Minus T. Judi Cong, MSW, [place] Disposition Clinical Social Work [phone] (cell) [phone] (office)

## 2017-08-26 NOTE — ED Notes (Signed)
Pt having Telepsych completed

## 2017-08-26 NOTE — Progress Notes (Signed)
[name] is a 52 year old female admitted on involuntary basis. On admission she reports that she does not know why she is here. She reports that she has not been depressed and reports that she has not been feeling suicidal and is able to contract for safety while in the hospital. She does have a religious preoccupation and did speak about how she was praying and that perhaps it was God's will that she be in the hospital. She reports that she has been taking all of her medications as prescribed and reports that she goes to [facility]. She reports that she lives alone and reports that she will return there upon discharge. [name] was oriented to the unit and safety maintained.

## 2017-08-26 NOTE — ED Notes (Signed)
Pt comes to RN and states, "I feel like you are holding me against my will. I did not say I was going to kill myself or anyone else. I want to talk to the doctor. You are disrespecting me by having a person watch me in my room." Delo, MD aware of pts request, this Rn requested to have daily meds ordered, pt updated on plan, pt agrees to take morning medications with water, attempting to use phone, pt is aggitated, pt informed that this process is a standard of care for her safety, pt informed that she is Involuntarily committed and must be determined to not be at risk of harming herself or others by a physician before she can leave our facility, pt verbalizes undestanding

## 2017-08-26 NOTE — ED Notes (Signed)
All home medications inventoried and taken to main pharmacy. Sheets on locker and chart.

## 2017-08-26 NOTE — ED Notes (Signed)
Breakfast tray ordered 

## 2017-08-26 NOTE — ED Notes (Signed)
Pt updated on plan to be transported to [facility] after 17:00, pt calm and cooperative at this time

## 2017-08-26 NOTE — ED Notes (Signed)
Pt on the phone, agitated with the person on the line, pt raising her voice, sitter with pt, pt following directions at this time, pt returned to her room, will continue to monitor

## 2017-08-26 NOTE — ED Notes (Signed)
Pt requesting to know when she will have here telepsych completed, this RN called [facility] and per [facility] pt to have eval completed within the hour, pt updated
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
## 2017-08-26 NOTE — ED Notes (Signed)
PAGED [employer] TRANSPORT

## 2017-08-26 NOTE — ED Notes (Signed)
Delo ,MD at bedside 

## 2017-08-26 NOTE — ED Notes (Signed)
Francia Greaves, MD at bedside updating pt that the recommendation is for inpatient treatment, the pts son is visiting and was provided the information pamphlet regarding [facility], pts son would like to be contacted re: his concerns r/t his mother's safety and not takin her medications, his contact info is-Daquan Deon Pilling [phone] contacted re: his request

## 2017-08-27 DIAGNOSIS — F250 Schizoaffective disorder, bipolar type: Principal | ICD-10-CM

## 2017-08-27 DIAGNOSIS — E785 Hyperlipidemia, unspecified: Secondary | ICD-10-CM

## 2017-08-27 DIAGNOSIS — I10 Essential (primary) hypertension: Secondary | ICD-10-CM

## 2017-08-27 DIAGNOSIS — F419 Anxiety disorder, unspecified: Secondary | ICD-10-CM

## 2017-08-27 DIAGNOSIS — Z9114 Patient's other noncompliance with medication regimen: Secondary | ICD-10-CM

## 2017-08-27 DIAGNOSIS — G4700 Insomnia, unspecified: Secondary | ICD-10-CM

## 2017-08-27 DIAGNOSIS — F17210 Nicotine dependence, cigarettes, uncomplicated: Secondary | ICD-10-CM

## 2017-08-27 DIAGNOSIS — Z915 Personal history of self-harm: Secondary | ICD-10-CM

## 2017-08-27 DIAGNOSIS — Z91419 Personal history of unspecified adult abuse: Secondary | ICD-10-CM

## 2017-08-27 DIAGNOSIS — E119 Type 2 diabetes mellitus without complications: Secondary | ICD-10-CM

## 2017-08-27 LAB — BASIC METABOLIC PANEL
Anion gap: 9 (ref 5–15)
BUN: 10 mg/dL (ref 6–20)
CALCIUM: 9.1 mg/dL (ref 8.9–10.3)
CO2: 24 mmol/L (ref 22–32)
Chloride: 105 mmol/L (ref 101–111)
Creatinine, Ser: 0.88 mg/dL (ref 0.44–1.00)
GFR calc Af Amer: >60 mL/min (ref >60)
Glucose, Bld: 122 mg/dL — ABNORMAL HIGH (ref 65–99)
Potassium: 3.6 mmol/L (ref 3.5–5.1)
Sodium: 138 mmol/L (ref 135–145)

## 2017-08-27 LAB — GLUCOSE, CAPILLARY
Glucose-Capillary: 139 mg/dL — ABNORMAL HIGH (ref 65–99)
Glucose-Capillary: 172 mg/dL — ABNORMAL HIGH (ref 65–99)
Glucose-Capillary: 167 mg/dL — ABNORMAL HIGH (ref 65–99)
Glucose-Capillary: 115 mg/dL — ABNORMAL HIGH (ref 65–99)
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
## 2017-08-27 NOTE — Progress Notes (Signed)
Recreation Therapy Notes  Date: 5.14.19 Time: 0950 Location: 500 Hall Dayroom  Group Topic: Coping Skills  Goal Area(s) Addresses:  Patient will be able to identify positive coping skills. Patients will be able to identify benefits of using coping skills post d/c.  Intervention: Scissors, glue, magazines, worksheet  Activity: Museum/gallery conservator.  Patients were given a worksheet that was divided into 5 categories (diversions, social, cognitive, tension releasers and physical).  Patients were to find pictures from the magazines that represent each category that could be used as coping skills.  Education: Radiographer, therapeutic, Dentist.   Education Outcome: Acknowledges understanding/In group clarification offered/Needs additional education.   Clinical Observations/Feedback: Pt did not attend group.    Victorino Sparrow, LRT/CTRS    Ria Comment, Tannor Pyon A 08/27/2017 11:06 AM

## 2017-08-27 NOTE — Progress Notes (Signed)
Patient presents with increasing agitation. Patient states "I don't need to be here, I don't know why I'm here." RN tried to explain patient is involuntary, and her being here is court ordered. Patient denies.  Patient compliant with A.M. medications, but noncompliant with EKG. States she's not doing anything else, she just wants to go home.  Support and encouragement given, safety maintained with 15 minute checks. Patient denies SI/HI/AVH. Contracts for safety.

## 2017-08-27 NOTE — [facility] Group Notes (Signed)
[facility] LCSW Group Therapy Note  Date/Time: 08/27/17, 1315  Type of Therapy/Topic:  Group Therapy:  Feelings about Diagnosis  Participation Level:  Did Not Attend   Mood:   Description of Group:    This group will allow patients to explore their thoughts and feelings about diagnoses they have received. Patients will be guided to explore their level of understanding and acceptance of these diagnoses. Facilitator will encourage patients to process their thoughts and feelings about the reactions of others to their diagnosis, and will guide patients in identifying ways to discuss their diagnosis with significant others in their lives. This group will be process-oriented, with patients participating in exploration of their own experiences as well as giving and receiving support and challenge from other group members.   Therapeutic Goals: 1. Patient will demonstrate understanding of diagnosis as evidence by identifying two or more symptoms of the disorder:  2. Patient will be able to express two feelings regarding the diagnosis 3. Patient will demonstrate ability to communicate their needs through discussion and/or role plays  Summary of Patient Progress:        Therapeutic Modalities:   Cognitive Behavioral Therapy Brief Therapy Feelings Identification   Lurline Idol, LCSW

## 2017-08-27 NOTE — Progress Notes (Signed)
Pt refused 1200 CBG and refused lunch.

## 2017-08-27 NOTE — [facility] Suicide Risk Assessment (Signed)
[facility] Admission Suicide Risk Assessment   Nursing information obtained from:    Demographic factors:    Current Mental Status:    Loss Factors:    Historical Factors:    Risk Reduction Factors:     Total Time spent with patient: 1 hour Principal Problem: Schizoaffective disorder, bipolar type ([place]) Diagnosis:   Patient Active Problem List   Diagnosis Date Noted  . Schizoaffective disorder, bipolar type ([place]) [F25.0] 08/26/2017  . Oral candidiasis [B37.0] 05/16/2017  . Immunity status testing [Z01.84] 04/17/2017  . Abdominal pain [R10.9] 03/28/2017  . Viral wart on finger [B07.9] 10/08/2016  . Diabetes mellitus type II, controlled ([place]) [E11.9] 12/19/2014  . Palpitations [R00.2] 12/15/2014  . Encounter for health education [Z71.9] 10/24/2014  . Left knee pain [M25.562] 08/25/2014  . Vision changes [H53.9] 08/12/2014  . Pulsatile tinnitus [id number]] 08/12/2014  . IIH (idiopathic intracranial hypertension) [G93.2] 08/12/2014  . Seasonal allergies [J30.2] 02/23/2014  . Hypokalemia [E87.6] 12/25/2013  . Diarrhea [R19.7] 10/14/2013  . Viral URI [J06.9] 05/12/2013  . Daytime hypersomnolence [G47.19] 12/09/2012  . COPD (chronic obstructive pulmonary disease) ([place]) [J44.9] 10/20/2012  . GERD (gastroesophageal reflux disease) [K21.9] 10/15/2012  . Preventative health care [Z00.00] 10/15/2012  . Pulmonary hypertension ([place]) [I27.20] 05/02/2012  . Diastolic heart failure ([place]) [I50.30] 02/07/2012  . IDA (iron deficiency anemia) [D50.9] 11/06/2011  . Arteriosclerotic cardiovascular disease (ASCVD) [I25.10]   . TOBACCO ABUSE [F17.200] 09/27/2009  . Hyperlipidemia [E78.5] 07/22/2007  . Obesity [E66.9] 07/22/2007  . Bipolar 1 disorder ([place]) [F31.9] 07/22/2007  . Resistant hypertension [I10] 07/22/2007  . Diabetes mellitus type 2, controlled ([place]) [E11.9] 09/28/2002   Subjective Data: See H&P for details  Continued Clinical Symptoms:  Alcohol Use Disorder Identification Test Final Score  (AUDIT): 0 The "Alcohol Use Disorders Identification Test", Guidelines for Use in Primary Care, Second Edition.  World Pharmacologist [facility]). Score between 0-7:  no or low risk or alcohol related problems. Score between 8-15:  moderate risk of alcohol related problems. Score between 16-19:  high risk of alcohol related problems. Score 20 or above:  warrants further diagnostic evaluation for alcohol dependence and treatment.   CLINICAL FACTORS:   Bipolar Disorder:   Mixed State Schizophrenia:   Paranoid or undifferentiated type   Psychiatric Specialty Exam: Physical Exam  Nursing note and vitals reviewed.   ROS- See H&P for details  Blood pressure 136/90, pulse 91, temperature 98.4 F (36.9 C), temperature source Oral, resp. rate 18, height 5\' 4"  (1.626 m), weight 87.1 kg (192 lb).Body mass index is 32.96 kg/m.    COGNITIVE FEATURES THAT CONTRIBUTE TO RISK:  None    SUICIDE RISK:   Minimal: No identifiable suicidal ideation.  Patients presenting with no risk factors but with morbid ruminations; may be classified as minimal risk based on the severity of the depressive symptoms  PLAN OF CARE: See H&P for details  I certify that inpatient services furnished can reasonably be expected to improve the patient's condition.   Pennelope Bracken, MD 08/27/2017, 12:57 PM
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
## 2017-08-27 NOTE — Progress Notes (Signed)
Psychoeducational Group Note  Date:  08/27/2017 Time:  2042  Group Topic/Focus:  Wrap-Up Group:   The focus of this group is to help patients review their daily goal of treatment and discuss progress on daily workbooks.  Participation Level: Did Not Attend  Participation Quality:  Not Applicable  Affect:  Not Applicable  Cognitive:  Not Applicable  Insight:  Not Applicable  Engagement in Group: Not Applicable  Additional Comments:  The patient did not attend group this evening.   Archie Balboa S 08/27/2017, 8:42 PM

## 2017-08-27 NOTE — Plan of Care (Addendum)
D: Pt denies SI/HI/AVH. Pt is pleasant and cooperative. Pt anxious , she wants to leave. Pt stated she was just tired and she could take some medication and go home. Pt was informed that we have to make sure she is going to be safe when she D/C and pt appeared to understand  A: Pt was offered support and encouragement. Pt was given scheduled medications. Pt was encourage to attend groups. Q 15 minute checks were done for safety.   R:Pt attends groups and interacts well with peers and staff. Pt is taking medication. Pt has no complaints.Pt receptive to treatment and safety maintained on unit.  Problem: Education: Goal: Emotional status will improve Outcome: Progressing   Problem: Coping: Goal: Ability to demonstrate self-control will improve Outcome: Progressing   Problem: Safety: Goal: Periods of time without injury will increase Outcome: Progressing
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
## 2017-08-27 NOTE — [facility] Suicide Risk Assessment (Signed)
[facility] [facility] INPATIENT:  Family/Significant Other Suicide Prevention Education  Suicide Prevention Education:  Contact Attempts: Verdell Carmine, son, [phone] been identified by the patient as the family member/significant other with whom the patient will be residing, and identified as the person(s) who will aid the patient in the event of a mental health crisis.  With written consent from the patient, two attempts were made to provide suicide prevention education, prior to and/or following the patient's discharge.  We were unsuccessful in providing suicide prevention education.  A suicide education pamphlet was given to the patient to share with family/significant other.  Date and time of first attempt:08/27/17, 1522 Date and time of second attempt:  Joanne Chars, LCSW 08/27/2017, 3:22 PM

## 2017-08-28 DIAGNOSIS — Z560 Unemployment, unspecified: Secondary | ICD-10-CM

## 2017-08-28 DIAGNOSIS — F1011 Alcohol abuse, in remission: Secondary | ICD-10-CM

## 2017-08-28 LAB — GLUCOSE, CAPILLARY: GLUCOSE-CAPILLARY: 105 mg/dL — AB (ref 65–99)

## 2017-08-28 MED ORDER — PRAVASTATIN SODIUM 20 MG PO TABS: 20.0000 mg | ORAL_TABLET | Freq: Every day | ORAL | 0 refills | Status: DC

## 2017-08-28 MED ORDER — ZIPRASIDONE HCL 60 MG PO CAPS: 60.0000 mg | ORAL_CAPSULE | Freq: Two times a day (BID) | ORAL | 0 refills | Status: DC

## 2017-08-28 MED ORDER — INSULIN GLARGINE 100 UNIT/ML ~~LOC~~ SOLN: 18.0000 [IU] | Freq: Every day | SUBCUTANEOUS | 0 refills | Status: DC

## 2017-08-28 MED ORDER — LOSARTAN POTASSIUM 100 MG PO TABS: 100.0000 mg | ORAL_TABLET | Freq: Every day | ORAL | 0 refills | Status: DC

## 2017-08-28 MED ORDER — ESOMEPRAZOLE MAGNESIUM 40 MG PO CPDR: 40.0000 mg | DELAYED_RELEASE_CAPSULE | Freq: Two times a day (BID) | ORAL | 0 refills | Status: DC

## 2017-08-28 MED ORDER — PANTOPRAZOLE SODIUM 40 MG PO TBEC: 40.0000 mg | DELAYED_RELEASE_TABLET | Freq: Every day | ORAL | 0 refills | Status: DC

## 2017-08-28 MED ORDER — METFORMIN HCL 1000 MG PO TABS: 1000.0000 mg | ORAL_TABLET | Freq: Two times a day (BID) | ORAL | 0 refills | Status: DC

## 2017-08-28 MED ORDER — PEN NEEDLES 31G X 5 MM MISC: 18.0000 [IU] | Freq: Every day | 0 refills | Status: DC

## 2017-08-28 MED ORDER — AMLODIPINE BESYLATE 10 MG PO TABS: 10.0000 mg | ORAL_TABLET | Freq: Every day | ORAL | 0 refills | Status: DC

## 2017-08-28 MED ORDER — TRAZODONE HCL 100 MG PO TABS: 100.0000 mg | ORAL_TABLET | Freq: Every evening | ORAL | 0 refills | Status: DC | PRN

## 2017-08-28 MED ORDER — METOPROLOL TARTRATE 100 MG PO TABS: 100.0000 mg | ORAL_TABLET | Freq: Two times a day (BID) | ORAL | 0 refills | Status: DC

## 2017-08-28 MED ORDER — SPIRONOLACTONE 50 MG PO TABS: 50.0000 mg | ORAL_TABLET | Freq: Every day | ORAL | 0 refills | Status: DC

## 2017-08-28 MED ORDER — FERROUS SULFATE 325 (65 FE) MG PO TABS: 325.0000 mg | ORAL_TABLET | ORAL | 0 refills | Status: DC

## 2017-08-28 MED ORDER — ALBUTEROL SULFATE HFA 108 (90 BASE) MCG/ACT IN AERS: 1.0000 | INHALATION_SPRAY | Freq: Four times a day (QID) | RESPIRATORY_TRACT | Status: DC | PRN

## 2017-08-28 MED ORDER — LOVASTATIN 40 MG PO TABS: 40.0000 mg | ORAL_TABLET | Freq: Every day | ORAL | 0 refills | Status: DC

## 2017-08-28 MED ORDER — LORATADINE 10 MG PO TABS: 10.0000 mg | ORAL_TABLET | Freq: Every day | ORAL | 0 refills | Status: DC

## 2017-08-28 NOTE — Progress Notes (Signed)
Recreation Therapy Notes  Date: 5.15.19 Time: 1000 Location: 500 [address] Dayroom  Group Topic: Wellness  Goal Area(s) Addresses:  Patient will define components of whole wellness. Patient will verbalize benefit of whole wellness.  Behavioral Response: Minimal  Intervention: Exercise  Activity:  Exercise.  LRT discussed with patients the importance of complete wellness.  LRT then lead patients in a series of stretches to loosen them up.  Each patient was to pick an exercise for the group to complete.  The group completed 2 rounds of exercises.  Education: Wellness, Dentist.   Education Outcome: Acknowledges education/In group clarification offered/Needs additional education.   Clinical Observations/Feedback: Pt stated she had problems with her knee but was able to complete the stretches and some of the exercises.  Pt was pleasant and smiling during group.     Victorino Sparrow, LRT/CTRS      Victorino Sparrow A 08/28/2017 12:30 PM

## 2017-08-28 NOTE — Discharge Summary (Addendum)
Physician Discharge Summary Note  Patient:  [name] is an 52 y.o., female MRN:  [id number] DOB:  July 14, 1965  Patient phone:  [phone] (home)   Patient address:   [address] [place] [place] [address],   Total Time spent with patient: Greater than 30 minutes  Date of Admission:  08/26/2017  Date of Discharge: 08-28-17  Reason for Admission: Worsening agitation, delusions, disorganized speech/behavior, poor medication adherence, and insomnia.  Principal Problem: Schizoaffective disorder, bipolar type [facility])  Discharge Diagnoses: Patient Active Problem List   Diagnosis Date Noted  . Schizoaffective disorder, bipolar type ([place]) [F25.0] 08/26/2017  . Oral candidiasis [B37.0] 05/16/2017  . Immunity status testing [Z01.84] 04/17/2017  . Abdominal pain [R10.9] 03/28/2017  . Viral wart on finger [B07.9] 10/08/2016  . Diabetes mellitus type II, controlled ([place]) [E11.9] 12/19/2014  . Palpitations [R00.2] 12/15/2014  . Encounter for health education [Z71.9] 10/24/2014  . Left knee pain [M25.562] 08/25/2014  . Vision changes [H53.9] 08/12/2014  . Pulsatile tinnitus [id number]] 08/12/2014  . IIH (idiopathic intracranial hypertension) [G93.2] 08/12/2014  . Seasonal allergies [J30.2] 02/23/2014  . Hypokalemia [E87.6] 12/25/2013  . Diarrhea [R19.7] 10/14/2013  . Viral URI [J06.9] 05/12/2013  . Daytime hypersomnolence [G47.19] 12/09/2012  . COPD (chronic obstructive pulmonary disease) ([place]) [J44.9] 10/20/2012  . GERD (gastroesophageal reflux disease) [K21.9] 10/15/2012  . Preventative health care [Z00.00] 10/15/2012  . Pulmonary hypertension ([place]) [I27.20] 05/02/2012  . Diastolic heart failure ([place]) [I50.30] 02/07/2012  . IDA (iron deficiency anemia) [D50.9] 11/06/2011  . Arteriosclerotic cardiovascular disease (ASCVD) [I25.10]   . TOBACCO ABUSE [F17.200] 09/27/2009  . Hyperlipidemia [E78.5] 07/22/2007  . Obesity [E66.9] 07/22/2007  . Bipolar 1 disorder ([place]) [F31.9]  07/22/2007  . Resistant hypertension [I10] 07/22/2007  . Diabetes mellitus type 2, controlled ([place]) [E11.9] 09/28/2002   Past Psychiatric History: Bipolar 1 disorder.  Past Medical History:  Past Medical History:  Diagnosis Date  . Alcohol abuse   . Anemia, iron deficiency   . Arteriosclerotic cardiovascular disease (ASCVD)    Minimal at cath in [facility].stress nuclear study in 8/08 with nl EF; neg stress echo in 2010  . Community acquired pneumonia 01/03/10, 05/2010, 04/2012   2011; with pleural effusion-hosp [facility] acute resp failure; intubated in Jan 2014 (HMPV pneumonia)  . Depression   . Diabetes mellitus, type 2 ([place]) 2000   Onset in 2000; no insulin  . Diastolic dysfunction    grade 2 per echo 2011  . Dysphagia   . Gastroesophageal reflux disease    Schatzki's ring  . History of alcohol abuse 07/22/2007   Qualifier: Diagnosis of  By: Lenn Cal    . Hyperlipidemia   . Hypertension `   during treatment with Geodon  . Obesity   . PTSD (post-traumatic stress disorder)   . Pulmonary hypertension ([place]) 05/02/2012   Patient needs repeat echo in 06/2012   . Schizoaffective disorder    requiring multiple psychiatric admissions    Past Surgical History:  Procedure Laterality Date  . COLONOSCOPY  01/2006   internal hemorrhoids  . COLONOSCOPY  01/10/2012   Dr. Rourk:Single anal canal hemorrhoidal tag likely source of  trivial hematochezia; right-sided colonic diverticulosis  . DILATION AND CURETTAGE, DIAGNOSTIC / THERAPEUTIC  1992  . ESOPHAGEAL DILATION N/A 08/18/2015   Procedure: ESOPHAGEAL DILATION;  Surgeon: Daneil Dolin, MD;  [facility];  Service: Endoscopy;  Laterality: N/A;  . ESOPHAGOGASTRODUODENOSCOPY  09/16/08   Dr. Trevor Iha hiatal hernia/excoriations involving the cardia and mucosa consistent  with trauma, antral erosions  of linear petechiae ? gastritis versus early gastric antral vascular  ectasia.Marland Kitchen biopsy showed reactive gastropathy. No H.  pylori.  . ESOPHAGOGASTRODUODENOSCOPY  09/2007   Dr. Evalee Mutton ring, dilated to 37 French Maloney dilator, small hiatal hernia, antral erosions, biopsies reactive gastropathy.  . ESOPHAGOGASTRODUODENOSCOPY (EGD) WITH PROPOFOL N/A 12/17/2013   EXH:BZJIRCV antral erosions and petechiae. Small hiatal hernia. No endoscopic explanation for patient's symptoms  . ESOPHAGOGASTRODUODENOSCOPY (EGD) WITH PROPOFOL N/A 08/18/2015   Procedure: ESOPHAGOGASTRODUODENOSCOPY (EGD) WITH PROPOFOL;  Surgeon: Daneil Dolin, MD;  [facility];  Service: Endoscopy;  Laterality: N/A;  0845  . SAVORY DILATION  07/17/2011   Fields-MAC sedation-->distal esophageal stricture s/p dilation, chronic gastritis, multiple ulcers in stomach. no h.pylori   Family History:  Family History  Problem Relation Age of Onset  . Hypertension Mother   . Stroke Father        deceased at age 4  . Colon cancer Other   . Heart disease Sister   . Diabetes Unknown   . High Cholesterol Unknown   . Arthritis Unknown   . Anesthesia problems Neg Hx   . Hypotension Neg Hx   . Malignant hyperthermia Neg Hx   . Pseudochol deficiency Neg Hx    Family Psychiatric  History: See H&P.  Social History:  Social History   Substance and Sexual Activity  Alcohol Use No  . Alcohol/week: 0.0 oz   Comment: hx of ETOH abuse     Social History   Substance and Sexual Activity  Drug Use No    Social History   Socioeconomic History  . Marital status: Single    Spouse name: Not on file  . Number of children: 2  . Years of education: some colge  . Highest education level: Not on file  Occupational History  . Occupation: Disability  . Occupation: UNEMPLOYED    Employer: UNEMPLOYED  Social Needs  . Financial resource strain: Not on file  . Food insecurity:    Worry: Not on file    Inability: Not on file  . Transportation needs:    Medical: Not on file    Non-medical: Not on file  Tobacco Use  . Smoking status: Current Some  Day Smoker    Packs/day: 1.00    Years: 30.00    Pack years: 30.00    Types: Cigarettes    Start date: 05/18/2012    Last attempt to quit: 03/28/2017    Years since quitting: 0.4  . Smokeless tobacco: Never Used  Substance and Sexual Activity  . Alcohol use: No    Alcohol/week: 0.0 oz    Comment: hx of ETOH abuse  . Drug use: No  . Sexual activity: Not Currently    Partners: Male    Birth control/protection: None  Lifestyle  . Physical activity:    Days per week: Not on file    Minutes per session: Not on file  . Stress: Not on file  Relationships  . Social connections:    Talks on phone: Not on file    Gets together: Not on file    Attends religious service: Not on file    Active member of club or organization: Not on file    Attends meetings of clubs or organizations: Not on file    Relationship status: Not on file  Other Topics Concern  . 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 While in the ED, pt was making statements that she was God and she was agitated. IVC was placed due to poor insight, agitation, and delusions. Pt's family had expressed concern that pt was potential risk of harming herself. Pt was medically cleared and then [facility] for additional treatment and stabilization. She had been restarted on geodon 42m po BID with meals & Trazodone 100 mg prn for insomnia. She was also  restarted on all her pertinent previous home medications for DMII, HTN, and HLD. Pt reported improvement of her presenting symptoms during her hospital stay, and she became bright and interactive in groups and the therapeutic milieu.   Upon evaluation today, pt shares, "I'm doing good today. I figured out what was wrong; I haven't been getting good sleep in months, and it caught up with me." Pt reports she is now sleeping well on the unit with use of trazodone. Her appetite is good. She denies any specific concerns today aside from seeking discharge. She denies physical complaints. She denies SI/HI/AH/VH. She is tolerating her medications without difficulty or side effects, and she is in agreement to continue her current regimen without changes after discharge. Discussed with patient about importance of taking geodon with sufficient calories to aid with proper absorption of her medication, and pt verbalized good understanding. [facility] She was able to engage in safety planning [facility] if she feels unable to maintain her own safety or the safety of others. Pt had no further questions, comments, or concerns.  Upon discharge, MDeleahpresents mentally & medically stable. She is recommended & will continue mental health care on an outpatient as noted below. She is provided with all the necessary information needed to make this appointment without problems. [facility] in no apparent distress. Transportation per son.  Physical Findings: AIMS: Facial and Oral Movements Muscles of Facial Expression: None, normal Lips and Perioral Area: None, normal Jaw: None, normal Tongue: None, normal,Extremity Movements Upper (arms, wrists, hands, fingers): None, normal Lower (legs, knees, ankles, toes): None, normal, Trunk Movements Neck, shoulders, hips: None, normal,  Overall Severity Severity of abnormal movements (highest score from questions above): None, normal Incapacitation due to abnormal movements: None, normal Patient's awareness of abnormal movements (rate only patient's report): No Awareness, Dental Status Current problems with teeth and/or dentures?: No Does patient usually wear dentures?: No  CIWA:    COWS:     Musculoskeletal: Strength & Muscle Tone: within normal limits Gait & Station: normal Patient leans: N/A  Psychiatric Specialty Exam: Physical Exam  Constitutional: She appears well-developed.  HENT:  Head: Normocephalic.  Eyes: Pupils are equal, round, and reactive to light.  Neck: Normal range of motion.  Cardiovascular: Normal rate.  Respiratory: Effort normal.  GI: Soft.  Genitourinary:  Genitourinary Comments: Deferred  Musculoskeletal: Normal range of motion.  Neurological: She is alert.  Skin: Skin is warm.  Review of Systems  Constitutional: Negative.   HENT: Negative.   Eyes: Negative.   Respiratory: Negative.   Cardiovascular: Negative.   Gastrointestinal: Negative.   Genitourinary: Negative.   Musculoskeletal: Negative.   Skin: Negative.   Neurological: Negative.   Endo/Heme/Allergies: Negative.   Psychiatric/Behavioral: Positive for depression (Stable) and hallucinations (Hx. psychosis (Stable)). Negative for memory loss, substance abuse and suicidal ideas. The patient has insomnia (Stable). The patient is not nervous/anxious.     Blood pressure (!) 167/82, pulse 68, temperature 98.6 F (37 C), temperature source Oral, resp. rate 18, height _0  (1.626 m), weight 87.1 kg (192 lb).Body mass index is 32.96 kg/m.  See Md's SRA   Have you used any form of tobacco in the last 30 days? (Cigarettes, Smokeless Tobacco, Cigars, and/or Pipes): Yes  Has this patient used any form of tobacco in the last 30 days? (Cigarettes, Smokeless Tobacco, Cigars, and/or Pipes): N/A  Blood Alcohol level:  Lab Results   Component Value Date   ETH <10 08/25/2017   ETH <5 30/12/2328   Metabolic Disorder Labs:  Lab Results  Component Value Date   HGBA1C 7.1 08/08/2017   MPG 189 (H) 06/10/2013   MPG 189 (H) 04/20/2012   No results found for: PROLACTIN Lab Results  Component Value Date   CHOL 123 06/14/2016   TRIG 142 06/14/2016   HDL 41 06/14/2016   CHOLHDL 3.0 06/14/2016   VLDL 49 (H) 02/10/2014   LDLCALC 54 06/14/2016   LDLCALC 84 06/17/2015   See Psychiatric Specialty Exam and Suicide Risk Assessment completed by Attending Physician prior to discharge.  Discharge destination:  Home  Is patient on multiple antipsychotic therapies at discharge:  No   Has Patient had three or more failed trials of antipsychotic monotherapy by history:  No  Recommended Plan for Multiple Antipsychotic Therapies: NA  Allergies as of 08/28/2017      Reactions   Cephalexin    Trouble breathing, felt like she had bumps in her throat.   Metronidazole Shortness Of Breath, Swelling   Orange Itching   Other Itching, Shortness Of Breath   Takes Benadryl before eating shrimp.   Shrimp [shellfish Allergy] Shortness Of Breath, Itching   Takes Benadryl before eating shrimp.   Penicillins Hives, Swelling   Has patient had a PCN reaction causing immediate rash, facial/tongue/throat swelling, SOB or lightheadedness with hypotension: Yes Has patient had a PCN reaction causing severe rash involving mucus membranes or skin necrosis: No Has patient had a PCN reaction that required hospitalization No Has patient had a PCN reaction occurring within the last 10 years: Yes If all of the above answers are "NO", then may proceed with Cephalosporin use. Fever as well   Sulfonamide Derivatives Hives   fever   Glipizide Other (See Comments)   psychosis   Ace Inhibitors Cough   06/2016   Sulfamethoxazole-trimethoprim Rash      Medication List    STOP taking these medications   acetaminophen 500 MG tablet Commonly known as:   TYLENOL   Insulin Glargine 100 UNIT/ML Solostar Pen Commonly known as:  LANTUS Replaced by:  insulin glargine 100 UNIT/ML injection     TAKE these medications     Indication  albuterol 108 (90 Base) MCG/ACT inhaler Commonly known as:  VENTOLIN HFA Inhale 1-2 puffs into the lungs every 6 (six) hours as needed for wheezing or shortness of breath. What changed:    how much to take  how to take this  when  to take this  reasons to take this  additional instructions  Indication:  Asthma   amLODipine 10 MG tablet Commonly known as:  NORVASC Take 1 tablet (10 mg total) by mouth daily. For high blood pressure What changed:  additional instructions  Indication:  High Blood Pressure Disorder   esomeprazole 40 MG capsule Commonly known as:  NEXIUM Take 1 capsule (40 mg total) by mouth 2 (two) times daily before a meal. For acid reflux What changed:  additional instructions  Indication:  Gastroesophageal Reflux Disease   ferrous sulfate 325 (65 FE) MG tablet Take 1 tablet (325 mg total) by mouth every other day. (May buy from over the counter): For anemia What changed:  additional instructions  Indication:  Iron Deficiency   insulin glargine 100 UNIT/ML injection Commonly known as:  LANTUS Inject 0.18 mLs (18 Units total) into the skin daily at 10 pm. For diabetes mangement Replaces:  Insulin Glargine 100 UNIT/ML Solostar Pen  Indication:  Type 2 Diabetes   loratadine 10 MG tablet Commonly known as:  CLARITIN Take 1 tablet (10 mg total) by mouth daily. (May buy from over the counter): For allergies What changed:  additional instructions  Indication:  Perennial Allergic Rhinitis, Hayfever   losartan 100 MG tablet Commonly known as:  COZAAR Take 1 tablet (100 mg total) by mouth daily. For high blood pressure What changed:  additional instructions  Indication:  High Blood Pressure Disorder   lovastatin 40 MG tablet Commonly known as:  MEVACOR Take 1 tablet (40 mg total)  by mouth daily. For high cholesterol What changed:  additional instructions  Indication:  Inherited Heterozygous Hypercholesterolemia, Type II A Hyperlipidemia   metFORMIN 1000 MG tablet Commonly known as:  GLUCOPHAGE Take 1 tablet (1,000 mg total) by mouth 2 (two) times daily with a meal. For diabetes management What changed:    how much to take  how to take this  when to take this  additional instructions  Indication:  Type 2 Diabetes   metoprolol tartrate 100 MG tablet Commonly known as:  LOPRESSOR Take 1 tablet (100 mg total) by mouth 2 (two) times daily. For high blood pressure What changed:  additional instructions  Indication:  High Blood Pressure Disorder   Pen Needles 31G X 5 MM Misc Inject 18 Units into the skin at bedtime. Use to inject insulin daily at bedtime: For diabetes management: What changed:  additional instructions  Indication:  Diabetes management   spironolactone 50 MG tablet Commonly known as:  ALDACTONE Take 1 tablet (50 mg total) by mouth daily. For high blood pressure Start taking on:  08/29/2017 What changed:  additional instructions  Indication:  High Blood Pressure Disorder   traZODone 100 MG tablet Commonly known as:  DESYREL Take 1 tablet (100 mg total) by mouth at bedtime as needed for sleep. For sleep  Indication:  Trouble Sleeping   ziprasidone 60 MG capsule Commonly known as:  GEODON Take 1 capsule (60 mg total) by mouth 2 (two) times daily with a meal. For mood control What changed:    medication strength  how much to take  when to take this  additional instructions  Indication:  Mood control      Follow-up Information    Services, [facility]. Go on 08/30/2017.   Why:  Please attend your appt on Friday, 08/30/17, at 8:30am. Contact information: [place] [address] [phone]          Follow-up recommendations: Activity:  As tolerated  Diet: As recommended by your primary care doctor. Keep all  scheduled follow-up appointments as recommended.   Comments: Patient is instructed prior to discharge to: Take all medications as prescribed by his/her mental healthcare provider. Report any adverse effects and or reactions from the medicines to his/her outpatient provider promptly. Patient has been instructed & cautioned: To not engage in alcohol and or illegal drug use while on prescription medicines. In the event of worsening symptoms, patient is instructed to call the crisis hotline, 911 and or go to the nearest ED for appropriate evaluation and treatment of symptoms. To follow-up with his/her primary care provider for your other medical issues, concerns and or health care needs.   Signed: Lindell Spar, NP, PMHNP, FNP-BC 08/28/2017, 10:53 AM   Patient seen, Suicide Assessment Completed.  Disposition Plan Reviewed

## 2017-08-28 NOTE — [facility] Suicide Risk Assessment (Signed)
[name] INPATIENT:  Family/Significant Other Suicide Prevention Education  Suicide Prevention Education:  Education Completed;[name], son, [phone],  has been identified by the patient as the family member/significant other with whom the patient will be residing, and identified as the person(s) who will aid the patient in the event of a mental health crisis (suicidal ideations/suicide attempt).  With written consent from the patient, the family member/significant other has been provided the following suicide prevention education, prior to the and/or following the discharge of the patient.  The suicide prevention education provided includes the following:  Suicide risk factors  Suicide prevention and interventions  National Suicide Hotline telephone number  [facility] assessment telephone number  [facility] Emergency Assistance [place] and/or Residential Mobile Crisis Unit telephone number  Request made of family/significant other to:  Remove weapons (e.g., guns, rifles, knives), all items previously/currently identified as safety concern.  No guns in the home, per son.  Remove drugs/medications (over-the-counter, prescriptions, illicit drugs), all items previously/currently identified as a safety concern.  The family member/significant other verbalizes understanding of the suicide prevention education information provided.  The family member/significant other agrees to remove the items of safety concern listed above.  Son reports that pt has history of these same types of incidents but has not had one in a while.  Pt mother said pt started acting funny, crying for no reason, and then pt disappeared.  Son found pt at her home sitting on the bed crying.  Pt was talking all of the religious stuff at that time.  Pt car was not there and son finally determined that pt had been driving in the middle of the night, ran out of gas, sent to a random house nearby  and got into an argument when they would not help. Sheriff was called and pt finally got a ride home.  Pt has gotten stressed due to school several times in the past.    Joanne Chars, LCSW 08/28/2017, 9:13 AM

## 2017-08-28 NOTE — Tx Team (Signed)
Interdisciplinary Treatment and Diagnostic Plan Update  08/28/2017 Time of Session: 0930 [name] MRN: [id number]  Principal Diagnosis: Schizoaffective disorder, bipolar type [facility])  Secondary Diagnoses: Principal Problem:   Schizoaffective disorder, bipolar type ([place])   Current Medications:  Current Facility-Administered Medications  Medication Dose Route Frequency Provider Last Rate Last Dose  . acetaminophen (TYLENOL) tablet 1,000 mg  1,000 mg Oral Q6H PRN Patriciaann Clan E, PA-C      . albuterol (PROVENTIL HFA;VENTOLIN HFA) 108 (90 Base) MCG/ACT inhaler 1-2 puff  1-2 puff Inhalation Q6H PRN Laverle Hobby, PA-C   2 puff at 08/26/17 2141  . alum & mag hydroxide-simeth (MAALOX/MYLANTA) 200-200-20 MG/5ML suspension 30 mL  30 mL Oral Q4H PRN Patriciaann Clan E, PA-C      . amLODipine (NORVASC) tablet 10 mg  10 mg Oral Daily Patriciaann Clan E, PA-C   10 mg at 08/28/17 0744  . insulin aspart (novoLOG) injection 0-15 Units  0-15 Units Subcutaneous TID WC Patriciaann Clan E, PA-C   3 Units at 08/27/17 1726  . insulin glargine (LANTUS) injection 18 Units  18 Units Subcutaneous Q2200 Laverle Hobby, PA-C   18 Units at 08/27/17 2120  . losartan (COZAAR) tablet 100 mg  100 mg Oral Daily Patriciaann Clan E, PA-C   100 mg at 08/28/17 9211  . magnesium hydroxide (MILK OF MAGNESIA) suspension 30 mL  30 mL Oral Daily PRN Laverle Hobby, PA-C      . metFORMIN (GLUCOPHAGE) tablet 1,000 mg  1,000 mg Oral BID WC Patriciaann Clan E, PA-C   1,000 mg at 08/28/17 0742  . metoprolol tartrate (LOPRESSOR) tablet 100 mg  100 mg Oral BID Laverle Hobby, PA-C   100 mg at 08/28/17 0745  . pantoprazole (PROTONIX) EC tablet 40 mg  40 mg Oral Daily Laverle Hobby, PA-C   40 mg at 08/28/17 0743  . pravastatin (PRAVACHOL) tablet 20 mg  20 mg Oral q1800 Laverle Hobby, PA-C   20 mg at 08/27/17 1727  . spironolactone (ALDACTONE) tablet 50 mg  50 mg Oral Daily Patriciaann Clan E, PA-C   50 mg at 08/28/17 0744  .  traZODone (DESYREL) tablet 100 mg  100 mg Oral QHS,MR X 1 Laverle Hobby, PA-C   100 mg at 08/27/17 2123  . ziprasidone (GEODON) capsule 60 mg  60 mg Oral BID WC Laverle Hobby, PA-C   60 mg at 08/28/17 9417   PTA Medications: Medications Prior to Admission  Medication Sig Dispense Refill Last Dose  . acetaminophen (TYLENOL) 500 MG tablet Take 1,000 mg by mouth every 6 (six) hours as needed for mild pain.   05/28/2017 at 1500  . albuterol (VENTOLIN HFA) 108 (90 Base) MCG/ACT inhaler INHALE 2 PUFFS 3 TIMES DAILY AS NEEDED FOR WHEEZING OR SHORTNESS OF BREATH. 1 Inhaler 3 05/28/2017 at Unknown time  . Insulin Glargine (LANTUS) 100 UNIT/ML Solostar Pen Inject 18 Units into the skin daily at 10 pm. 15 mL 1   . lovastatin (MEVACOR) 40 MG tablet Take 1 tablet (40 mg total) by mouth daily. 90 tablet 3 05/28/2017  . metFORMIN (GLUCOPHAGE) 1000 MG tablet TAKE (1) TABLET BY MOUTH TWICE DAILY WITH MEALS. 180 tablet 3 05/28/2017  . metoprolol tartrate (LOPRESSOR) 100 MG tablet Take 1 tablet (100 mg total) by mouth 2 (two) times daily. 180 tablet 3 05/28/2017  . spironolactone (ALDACTONE) 50 MG tablet Take 1 tablet (50 mg total) by mouth daily. 90 tablet 3 05/28/2017  .  ziprasidone (GEODON) 80 MG capsule Take 160 mg by mouth at bedtime.    05/27/2017 at Unknown time  . [DISCONTINUED] amLODipine (NORVASC) 10 MG tablet Take 1 tablet (10 mg total) by mouth daily. 90 tablet 2 05/28/2017  . [DISCONTINUED] esomeprazole (NEXIUM) 40 MG capsule Take 1 capsule (40 mg total) by mouth 2 (two) times daily before a meal. 60 capsule 5   . [DISCONTINUED] ferrous sulfate 325 (65 FE) MG tablet Take 1 tablet (325 mg total) by mouth every other day. 60 tablet 1 Past Week at Unknown time  . [DISCONTINUED] Insulin Pen Needle (PEN NEEDLES) 31G X 5 MM MISC Inject 18 Units into the skin at bedtime. Use to inject insulin daily at bedtime 100 each 11 05/27/2017 at Unknown time  . [DISCONTINUED] loratadine (CLARITIN) 10 MG tablet Take 1 tablet  (10 mg total) by mouth daily. 90 tablet 3 05/28/2017  . [DISCONTINUED] losartan (COZAAR) 100 MG tablet Take 1 tablet (100 mg total) by mouth daily. 90 tablet 3 05/28/2017    Patient Stressors: Marital or family conflict Traumatic event  Patient Strengths: Ability for insight Average or above average intelligence Capable of independent living General fund of knowledge  Treatment Modalities: Medication Management, Group therapy, Case management,  1 to 1 session with clinician, Psychoeducation, Recreational therapy.   Physician Treatment Plan for Primary Diagnosis: Schizoaffective disorder, bipolar type ([place]) Long Term Goal(s): Improvement in symptoms so as ready for discharge Improvement in symptoms so as ready for discharge   Short Term Goals: Ability to identify and develop effective coping behaviors will improve Compliance with prescribed medications will improve  Medication Management: Evaluate patient's response, side effects, and tolerance of medication regimen.  Therapeutic Interventions: 1 to 1 sessions, Unit Group sessions and Medication administration.  Evaluation of Outcomes: Adequate for Discharge  Physician Treatment Plan for Secondary Diagnosis: Principal Problem:   Schizoaffective disorder, bipolar type ([place])  Long Term Goal(s): Improvement in symptoms so as ready for discharge Improvement in symptoms so as ready for discharge   Short Term Goals: Ability to identify and develop effective coping behaviors will improve Compliance with prescribed medications will improve     Medication Management: Evaluate patient's response, side effects, and tolerance of medication regimen.  Therapeutic Interventions: 1 to 1 sessions, Unit Group sessions and Medication administration.  Evaluation of Outcomes: Adequate for Discharge   RN Treatment Plan for Primary Diagnosis: Schizoaffective disorder, bipolar type ([place]) Long Term Goal(s): Knowledge of disease and therapeutic  regimen to maintain health will improve  Short Term Goals: Ability to identify and develop effective coping behaviors will improve and Compliance with prescribed medications will improve  Medication Management: RN will administer medications as ordered by provider, will assess and evaluate patient's response and provide education to patient for prescribed medication. RN will report any adverse and/or side effects to prescribing provider.  Therapeutic Interventions: 1 on 1 counseling sessions, Psychoeducation, Medication administration, Evaluate responses to treatment, Monitor vital signs and CBGs as ordered, Perform/monitor CIWA, COWS, AIMS and Fall Risk screenings as ordered, Perform wound care treatments as ordered.  Evaluation of Outcomes: Adequate for Discharge   LCSW Treatment Plan for Primary Diagnosis: Schizoaffective disorder, bipolar type ([place]) Long Term Goal(s): Safe transition to appropriate next level of care at discharge, Engage patient in therapeutic group addressing interpersonal concerns.  Short Term Goals: Engage patient in aftercare planning with referrals and resources, Increase social support and Increase skills for wellness and recovery  Therapeutic Interventions: Assess for all discharge needs, 1 to 1  time with Education officer, museum, Explore available resources and support systems, Assess for adequacy in community support network, Educate family and significant other(s) on suicide prevention, Complete Psychosocial Assessment, Interpersonal group therapy.  Evaluation of Outcomes: Adequate for Discharge   Progress in Treatment: Attending groups: No. Participating in groups: No. Taking medication as prescribed: Yes. Toleration medication: Yes. Family/Significant other contact made: Yes, individual(s) contacted:  son Patient understands diagnosis: Yes. Discussing patient identified problems/goals with staff: Yes. Medical problems stabilized or resolved: Yes. Denies  suicidal/homicidal ideation: Yes. Issues/concerns per patient self-inventory: No. Other: none  New problem(s) identified: No, Describe:  none  New Short Term/Long Term Goal(s):Pt goal: "My lack of sleep caught up with me.  Need to rest."  Discharge Plan or Barriers:   Reason for Continuation of Hospitalization: None: discharge today  Estimated Length of Stay:discharge today.  Attendees: Patient:[name] 08/28/2017   Physician: Dr Nancy Fetter MD 08/28/2017   Nursing: Nicholes Rough, RN 08/28/2017   RN Care Manager: 08/28/2017   Social Worker: Lurline Idol, LCSW 08/28/2017   Recreational Therapist:  08/28/2017   Other:  08/28/2017   Other:  08/28/2017   Other: 08/28/2017        Scribe for Treatment Team: Joanne Chars, [place] 08/28/2017 1:02 PM

## 2017-08-28 NOTE — BHH Group Notes (Signed)
  [place] LCSW Group Therapy Note  Date/Time: 08/28/17, 1315  Type of Therapy/Topic:  Group Therapy:  Emotion Regulation  Participation Level:  Did Not Attend   Mood:  Description of Group:    The purpose of this group is to assist patients in learning to regulate negative emotions and experience positive emotions. Patients will be guided to discuss ways in which they have been vulnerable to their negative emotions. These vulnerabilities will be juxtaposed with experiences of positive emotions or situations, and patients challenged to use positive emotions to combat negative ones. Special emphasis will be placed on coping with negative emotions in conflict situations, and patients will process healthy conflict resolution skills.  Therapeutic Goals: 1. Patient will identify two positive emotions or experiences to reflect on in order to balance out negative emotions:  2. Patient will label two or more emotions that they find the most difficult to experience:  3. Patient will be able to demonstrate positive conflict resolution skills through discussion or role plays:   Summary of Patient Progress:       Therapeutic Modalities:   Cognitive Behavioral Therapy Feelings Identification Dialectical Behavioral Therapy  Lurline Idol, LCSW

## 2017-08-28 NOTE — BHH Suicide Risk Assessment (Signed)
[facility] Discharge Suicide Risk Assessment   Principal Problem: Schizoaffective disorder, bipolar type [facility]) Discharge Diagnoses:  Patient Active Problem List   Diagnosis Date Noted  . Schizoaffective disorder, bipolar type ([place]) [F25.0] 08/26/2017  . Oral candidiasis [B37.0] 05/16/2017  . Immunity status testing [Z01.84] 04/17/2017  . Abdominal pain [R10.9] 03/28/2017  . Viral wart on finger [B07.9] 10/08/2016  . Diabetes mellitus type II, controlled ([place]) [E11.9] 12/19/2014  . Palpitations [R00.2] 12/15/2014  . Encounter for health education [Z71.9] 10/24/2014  . Left knee pain [M25.562] 08/25/2014  . Vision changes [H53.9] 08/12/2014  . Pulsatile tinnitus [id number]] 08/12/2014  . IIH (idiopathic intracranial hypertension) [G93.2] 08/12/2014  . Seasonal allergies [J30.2] 02/23/2014  . Hypokalemia [E87.6] 12/25/2013  . Diarrhea [R19.7] 10/14/2013  . Viral URI [J06.9] 05/12/2013  . Daytime hypersomnolence [G47.19] 12/09/2012  . COPD (chronic obstructive pulmonary disease) ([place]) [J44.9] 10/20/2012  . GERD (gastroesophageal reflux disease) [K21.9] 10/15/2012  . Preventative health care [Z00.00] 10/15/2012  . Pulmonary hypertension ([place]) [I27.20] 05/02/2012  . Diastolic heart failure ([place]) [I50.30] 02/07/2012  . IDA (iron deficiency anemia) [D50.9] 11/06/2011  . Arteriosclerotic cardiovascular disease (ASCVD) [I25.10]   . TOBACCO ABUSE [F17.200] 09/27/2009  . Hyperlipidemia [E78.5] 07/22/2007  . Obesity [E66.9] 07/22/2007  . Bipolar 1 disorder ([place]) [F31.9] 07/22/2007  . Resistant hypertension [I10] 07/22/2007  . Diabetes mellitus type 2, controlled ([place]) [E11.9] 09/28/2002    Total Time spent with patient: 30 minutes  Musculoskeletal: Strength & Muscle Tone: within normal limits Gait & Station: normal Patient leans: N/A  Psychiatric Specialty Exam: Review of Systems  Constitutional: Negative for chills and fever.  Respiratory: Negative for cough and shortness of breath.    Cardiovascular: Negative for chest pain.  Gastrointestinal: Negative for abdominal pain, heartburn, nausea and vomiting.  Psychiatric/Behavioral: Negative for depression, hallucinations and suicidal ideas. The patient is not nervous/anxious and does not have insomnia.     Blood pressure (!) 167/82, pulse 68, temperature 98.6 F (37 C), temperature source Oral, resp. rate 18, height 5\' 4"  (1.626 m), weight 87.1 kg (192 lb).Body mass index is 32.96 kg/m.  General Appearance: Casual and Fairly Groomed  Engineer, water::  Good  Speech:  Clear and Coherent and Normal Rate  Volume:  Normal  Mood:  Euthymic  Affect:  Appropriate and Congruent  Thought Process:  Coherent  Orientation:  Full (Time, Place, and Person)  Thought Content:  Logical  Suicidal Thoughts:  No  Homicidal Thoughts:  No  Memory:  Immediate;   Fair Recent;   Good Remote;   Good  Judgement:  Fair  Insight:  Fair  Psychomotor Activity:  Normal  Concentration:  Fair  Recall:  [employer] of Knowledge:Fair  Language: Fair  Akathisia:  No  Handed:    AIMS (if indicated):     Assets:  Communication Skills Resilience Social Support  Sleep:  Number of Hours: 5.75  Cognition: WNL  ADL's:  Intact   Mental Status Per Nursing Assessment::   On Admission:     Demographic Factors:  Low socioeconomic status and Living alone  Loss Factors: Financial problems/change in socioeconomic status  Historical Factors: Prior suicide attempts and Impulsivity  Risk Reduction Factors:   Sense of responsibility to family, Positive social support, Positive therapeutic relationship and Positive coping skills or problem solving skills  Continued Clinical Symptoms:  Bipolar Disorder:   Mixed State Schizophrenia:   Paranoid or undifferentiated type More than one psychiatric diagnosis Previous Psychiatric Diagnoses and Treatments Medical Diagnoses and Treatments/Surgeries  Cognitive  Features That Contribute To Risk:  None     Suicide Risk:  Minimal: No identifiable suicidal ideation.  Patients presenting with no risk factors but with morbid ruminations; may be classified as minimal risk based on the severity of the depressive symptoms  Follow-up Information    Services, [facility]. Go on 08/30/2017.   Why:  Please attend your appt on Friday, 08/30/17, at 8:30am. Contact information: [place] [address] [phone]         Subjective Data:  [name] is a 52 y/o F with history of schizoaffective disorder bipolar type who was admitted on IVC placed in ED after she was brought in by family to [facility] with worsening agitation, delusions, disorganized speech/behavior, poor medication adherence, and insomnia. While in the ED, pt was making statements that she was God and she was agitated. IVC was placed due to poor insight, agitation, and delusions. Pt's family had expressed concern that pt was potential risk of harming herself. Pt was medically cleared and then transferred to [facility] for additional treatment and stabilization. She had been restarted on geodon 60mg  po BID with meals, and she was also restarted on her previous home medications for DMII, HTN, and HLD. Pt reported improvement of her presenting symptoms during her hospital stay, and she became bright and interactive in groups and the therapeutic milieu.  Upon evaluation today, pt shares, "I'm doing good today. I figured out what was wrong; I haven't been getting good sleep in months, and it caught up with me." Pt reports she is now sleeping well on the unit with use of trazodone. Her appetite is good. She denies any specific concerns today aside from seeking discharge. She denies physical complaints. She denies SI/HI/AH/VH. She is tolerating her medications without difficulty or side effects, and she is in agreement to continue her current regimen without changes after discharge. Discussed with patient about importance of taking geodon with  sufficient calories to aid with proper absorption of her medication, and pt verbalized good understanding. She plans to follow up with her current outpatient provider at [facility]. She was able to engage in safety planning including plan to return to [facility] or contact emergency services if she feels unable to maintain her own safety or the safety of others. Pt had no further questions, comments, or concerns.   Plan Of Care/Follow-up recommendations:   -Discharge to outpatient level of care  -Schizoaffective disorder, bipolar type   -Continue geodon 60mg  po BID with meals  -Insomnia  -Continue trazodone 100mg  po qhs  -DMII   -Continue metformin 1000mg  po BID with meals   -Continue lantus 18 units [place] qDay at 2200   -Continue novolog 0-15 units [place] TID with meals  -HLD   -Continue pravastatin 20mg  po qDay  -GERD   -Continue protonix 40mg  po qDay  -HTN   -Continue spironolactone 50mg  po qDay   -Continue lopressor 100mg  po BID   -Continue losartan 100mg  po Qday   -Continue amlopdipine 10mg  po qDay   Activity:  as tolerated Diet:  normal Tests:  NA Other:  see above for [place], MD 08/28/2017, 10:11 AM

## 2017-08-28 NOTE — Progress Notes (Signed)
  [facility] Adult Case Management Discharge Plan :  Will you be returning to the same living situation after discharge:  Yes,  own home At discharge, do you have transportation home?: Yes,  son Do you have the ability to pay for your medications: Yes,  medicare/medicaid  Release of information consent forms completed and in the chart;  Patient's signature needed at discharge.  Patient to Follow up at: Follow-up Information    [facility], [facility]. Go on 08/30/2017.   Why:  Please attend your appt on Friday, 08/30/17, at 8:30am. Contact information: [address] [place] [address] [phone]           Next level of care provider has access to [place] and Suicide Prevention discussed: Yes,  with son  Have you used any form of tobacco in the last 30 days? (Cigarettes, Smokeless Tobacco, Cigars, and/or Pipes): Yes  Has patient been referred to the Quitline?: Patient refused referral  Patient has been referred for addiction treatment: N/A  Joanne Chars, [place] 08/28/2017, 10:05 AM

## 2017-09-24 ENCOUNTER — Other Ambulatory Visit: Payer: Self-pay

## 2017-09-24 MED ORDER — PEN NEEDLES 31G X 5 MM MISC: 18.0000 [IU] | Freq: Every day | 2 refills | Status: DC

## 2017-09-24 MED ORDER — SPIRONOLACTONE 50 MG PO TABS: 50.0000 mg | ORAL_TABLET | Freq: Every day | ORAL | 3 refills | Status: DC

## 2017-09-24 MED ORDER — LOSARTAN POTASSIUM 100 MG PO TABS: 100.0000 mg | ORAL_TABLET | Freq: Every day | ORAL | 3 refills | Status: DC

## 2017-09-24 MED ORDER — AMLODIPINE BESYLATE 10 MG PO TABS: 10.0000 mg | ORAL_TABLET | Freq: Every day | ORAL | 3 refills | Status: DC

## 2017-09-24 MED ORDER — ALBUTEROL SULFATE HFA 108 (90 BASE) MCG/ACT IN AERS: 1.0000 | INHALATION_SPRAY | Freq: Four times a day (QID) | RESPIRATORY_TRACT | 5 refills | Status: DC | PRN

## 2017-09-24 MED ORDER — TRAZODONE HCL 100 MG PO TABS: 100.0000 mg | ORAL_TABLET | Freq: Every evening | ORAL | 2 refills | Status: DC | PRN

## 2017-09-24 MED ORDER — METOPROLOL TARTRATE 100 MG PO TABS: 100.0000 mg | ORAL_TABLET | Freq: Two times a day (BID) | ORAL | 3 refills | Status: DC

## 2017-09-24 MED ORDER — METFORMIN HCL 1000 MG PO TABS: 1000.0000 mg | ORAL_TABLET | Freq: Two times a day (BID) | ORAL | 3 refills | Status: DC

## 2017-09-24 MED ORDER — FERROUS SULFATE 325 (65 FE) MG PO TABS: 325.0000 mg | ORAL_TABLET | ORAL | 2 refills | Status: DC

## 2017-09-24 MED ORDER — ESOMEPRAZOLE MAGNESIUM 40 MG PO CPDR: 40.0000 mg | DELAYED_RELEASE_CAPSULE | Freq: Two times a day (BID) | ORAL | 2 refills | Status: DC

## 2017-09-24 MED ORDER — INSULIN GLARGINE 100 UNIT/ML ~~LOC~~ SOLN: 18.0000 [IU] | Freq: Every day | SUBCUTANEOUS | 5 refills | Status: DC

## 2017-09-24 MED ORDER — LOVASTATIN 40 MG PO TABS: 40.0000 mg | ORAL_TABLET | Freq: Every day | ORAL | 3 refills | Status: DC

## 2017-09-24 MED ORDER — LORATADINE 10 MG PO TABS: 10.0000 mg | ORAL_TABLET | Freq: Every day | ORAL | 2 refills | Status: DC

## 2017-09-24 NOTE — Telephone Encounter (Signed)
Requesting all meds to be filled. Please call pt back.

## 2017-09-24 NOTE — Telephone Encounter (Signed)
I double checked all these meds the best I could against our med list and the most recent discharge summary from [facility]. She can follow up as usual with PCP.

## 2017-11-07 ENCOUNTER — Ambulatory Visit: Payer: Self-pay

## 2017-12-12 ENCOUNTER — Other Ambulatory Visit: Payer: Self-pay

## 2017-12-12 ENCOUNTER — Ambulatory Visit (INDEPENDENT_AMBULATORY_CARE_PROVIDER_SITE_OTHER): Payer: Medicare Other | Admitting: Internal Medicine

## 2017-12-12 ENCOUNTER — Encounter: Payer: Self-pay | Admitting: Internal Medicine

## 2017-12-12 VITALS — BP 151/85 | HR 75 | Temp 99.0°F | Ht 64.0 in | Wt 204.3 lb

## 2017-12-12 DIAGNOSIS — F17200 Nicotine dependence, unspecified, uncomplicated: Secondary | ICD-10-CM

## 2017-12-12 DIAGNOSIS — R5383 Other fatigue: Secondary | ICD-10-CM

## 2017-12-12 DIAGNOSIS — E119 Type 2 diabetes mellitus without complications: Secondary | ICD-10-CM | POA: Diagnosis not present

## 2017-12-12 DIAGNOSIS — I10 Essential (primary) hypertension: Secondary | ICD-10-CM

## 2017-12-12 DIAGNOSIS — Z794 Long term (current) use of insulin: Secondary | ICD-10-CM | POA: Diagnosis not present

## 2017-12-12 DIAGNOSIS — Z832 Family history of diseases of the blood and blood-forming organs and certain disorders involving the immune mechanism: Secondary | ICD-10-CM

## 2017-12-12 DIAGNOSIS — D509 Iron deficiency anemia, unspecified: Secondary | ICD-10-CM | POA: Diagnosis not present

## 2017-12-12 DIAGNOSIS — Z79899 Other long term (current) drug therapy: Secondary | ICD-10-CM

## 2017-12-12 DIAGNOSIS — Z720 Tobacco use: Secondary | ICD-10-CM

## 2017-12-12 LAB — POCT GLYCOSYLATED HEMOGLOBIN (HGB A1C): Hemoglobin A1C: 7.9 % — AB (ref 4.0–5.6)

## 2017-12-12 LAB — GLUCOSE, CAPILLARY: Glucose-Capillary: 129 mg/dL — ABNORMAL HIGH (ref 70–99)

## 2017-12-12 MED ORDER — CYANOCOBALAMIN 1000 MCG/ML IJ SOLN
1000.0000 ug | Freq: Once | INTRAMUSCULAR | Status: AC
  Administered 2017-12-12: 1000 ug via INTRAMUSCULAR

## 2017-12-12 NOTE — Assessment & Plan Note (Signed)
A1c elevated from 7.1 to 7.9. She is continuing to take the Lantus 18 units and Metformin 1000 mg BID. She is drinking more sugary beverages and has gained 14 lbs since May. She is not interested in any more medications. She would like to work on diet and exercise.   Plan: - Continue Lantus 18 units - Continue Metformin 1000 mg BID - Encouraged lifestyle modifications

## 2017-12-12 NOTE — Assessment & Plan Note (Addendum)
Patient's BP is currently 151/85. This is above goal. She is on Losartan, Amlodipine, Spironolactone, and Metoprolol. Labs done on 5/14 with normal renal function, bicarb, and potassium. She is not interested in any other medication. Encouraged weight loss and lifestyle modification.   Plan: - Continue Amlodipine 10 mg QD  - Continue Losartan 100 mg QD - Continue Spironolactone 50 mg QD  - Continue Metoprolol 100 mg BID - If elevated at next visit, will increase Spironolactone if BP remains above goal.

## 2017-12-12 NOTE — Patient Instructions (Signed)
Thank you for allowing [name] to provide your care. Today we gave you a B12 shot and checked your B12 levels. I will call you with these results. Please cut back on your soda and tea intake. We will recheck your A1c in 3 months and if it continues to run high we will need to start you on some new medications.

## 2017-12-12 NOTE — Assessment & Plan Note (Addendum)
Patient presenting with complaints of generalized fatigue. She states that she has multiple family members with B12 deficiency and they get B12 shots. She would like to get a B12 shot today. She endorse AM headaches and occasionally falling asleep while in quite areas or watching TV. She had a sleep study in 2016 with some apnea but not enough to qualify her for a CPAP. She is not interested in taking another sleep study. TSH check <6 months ago and normal. She does have iron deficiency anemia. Her weight is up 14 lbs since May 2019. She denies feeling of depression.   We discussed that fatigue is often multifactorial relating to diet, exercise, medications, weight, and sleep. She is not interested in getting another sleep study. She would like a B12 shot today and be tested for B12 deficiency. We discussed the associated risks/benefits and she would like to proceed.   Plan: - Encouraged balanced diet and exercise  - Continue iron supplementation  - Check B12 levels and B12 shot

## 2017-12-12 NOTE — Assessment & Plan Note (Signed)
Continues to smoke. Discussed cessation.

## 2017-12-12 NOTE — Progress Notes (Signed)
   CC: Diabetes Mellitus   HPI:  [name] is a 52 y.o. female who presented to the clinic for continued evaluation and management of her chronic medical illnesses. For a detailed assessment and plan please refer to problem based charting below.   Past Medical History:  Diagnosis Date  . Alcohol abuse   . Anemia, iron deficiency   . Arteriosclerotic cardiovascular disease (ASCVD)    Minimal at cath in [facility].stress nuclear study in 8/08 with nl EF; neg stress echo in 2010  . Community acquired pneumonia 01/03/10, 05/2010, 04/2012   2011; with pleural effusion-hosp [name] acute resp failure; intubated in Jan 2014 (HMPV pneumonia)  . Depression   . Diabetes mellitus, type 2 ([place]) 2000   Onset in 2000; no insulin  . Diastolic dysfunction    grade 2 per echo 2011  . Dysphagia   . Gastroesophageal reflux disease    Schatzki's ring  . History of alcohol abuse 07/22/2007   Qualifier: Diagnosis of  By: Lenn Cal    . Hyperlipidemia   . Hypertension `   during treatment with Geodon  . Obesity   . PTSD (post-traumatic stress disorder)   . Pulmonary hypertension ([place]) 05/02/2012   Patient needs repeat echo in 06/2012   . Schizoaffective disorder    requiring multiple psychiatric admissions   Review of Systems:  12 point ROS preformed. All negative aside from those mentioned in the HPI.  Physical Exam: Vitals:   12/12/17 1454  BP: (!) 151/85  Pulse: 75  Temp: 99 F (37.2 C)  TempSrc: Oral  SpO2: 99%  Weight: 204 lb 4.8 oz (92.7 kg)  Height: 5\' 4"  (1.626 m)   General: Well nourished female in no acute distress Pulm: Good air movement with no wheezing or crackles  CV: RRR, no murmurs, no rubs  Extremities: Pulses palpable in all extremities, no LE edema   Assessment & Plan:   See Encounters Tab for problem based charting.  Patient discussed with Dr. Rebeca Alert

## 2017-12-15 NOTE — Progress Notes (Signed)
Internal Medicine Clinic Attending  Case discussed with Dr. Helberg at the time of the visit.  We reviewed the resident's history and exam and pertinent patient test results.  I agree with the assessment, diagnosis, and plan of care documented in the resident's note.  Alexander Raines, M.D., Ph.D.  

## 2017-12-16 LAB — METHYLMALONIC ACID, SERUM: Methylmalonic Acid: 97 nmol/L (ref 0–378)

## 2017-12-16 LAB — VITAMIN B12: Vitamin B-12: 460 pg/mL (ref 232–1245)

## 2017-12-25 ENCOUNTER — Other Ambulatory Visit: Payer: Self-pay | Admitting: Internal Medicine

## 2017-12-25 DIAGNOSIS — Z794 Long term (current) use of insulin: Principal | ICD-10-CM

## 2017-12-25 DIAGNOSIS — E119 Type 2 diabetes mellitus without complications: Secondary | ICD-10-CM

## 2017-12-26 ENCOUNTER — Telehealth: Payer: Self-pay

## 2017-12-26 DIAGNOSIS — E119 Type 2 diabetes mellitus without complications: Secondary | ICD-10-CM

## 2017-12-26 DIAGNOSIS — Z794 Long term (current) use of insulin: Principal | ICD-10-CM

## 2017-12-26 MED ORDER — INSULIN GLARGINE 100 UNITS/ML SOLOSTAR PEN: 18.0000 [IU] | PEN_INJECTOR | Freq: Every day | SUBCUTANEOUS | 1 refills | Status: DC

## 2017-12-26 NOTE — Telephone Encounter (Signed)
I called [facility] pharmacy-stated pt is requesting Lantus pens not vials.Please send new rx and change on med list . Thanks Also pt is requesting lab results.

## 2017-12-26 NOTE — Telephone Encounter (Signed)
Requesting a refill on insulin glargine (LANTUS) 100 UNIT/ML injection. States the pharmacy is still waiting for the office to reply back. Also requesting lab results. Please call pt back.

## 2017-12-26 NOTE — Telephone Encounter (Signed)
New prescription for generic lantus pens sent in.

## 2017-12-26 NOTE — Telephone Encounter (Signed)
Prescription sent and discussed B12 levels.

## 2018-01-02 ENCOUNTER — Ambulatory Visit: Payer: Self-pay

## 2018-01-13 ENCOUNTER — Telehealth: Payer: Self-pay | Admitting: Internal Medicine

## 2018-01-13 NOTE — Telephone Encounter (Signed)
Needs refill on Insulin sent to [facility] on [address] Linna Hoff, pt contact  [phone]

## 2018-01-13 NOTE — Telephone Encounter (Signed)
Lantus rx written  9/12 was "Phone in" . Verbal order given to [place] for "Inject 18 units into skin daily qty#15 ml x 1 rf" per Dr Tarri Abernethy. Called pt of refill - no answer; left compliant message.

## 2018-01-13 NOTE — Telephone Encounter (Signed)
Needs refill on insulin glargine (LANTUS) 100 unit/mL SOPN [facility] on [address] in [place], pt needs that one already has the other one, pt contact [phone]

## 2018-01-15 ENCOUNTER — Other Ambulatory Visit: Payer: Self-pay | Admitting: *Deleted

## 2018-01-15 DIAGNOSIS — Z794 Long term (current) use of insulin: Principal | ICD-10-CM

## 2018-01-15 DIAGNOSIS — E119 Type 2 diabetes mellitus without complications: Secondary | ICD-10-CM

## 2018-01-20 MED ORDER — INSULIN GLARGINE 100 UNITS/ML SOLOSTAR PEN: 18.0000 [IU] | PEN_INJECTOR | Freq: Every day | SUBCUTANEOUS | 1 refills | Status: DC

## 2018-01-20 NOTE — Telephone Encounter (Signed)
New lantus prescription sent out. I apologize to nursing that this is a call in prescription. I tried to reorder the prescription and even wrote a new prescription but was alerted that "This medication is not configured to e-prescribe." I will talk with my colleagues and [name] to see if there is currently a work around. Thank you for your help.

## 2018-02-04 ENCOUNTER — Ambulatory Visit: Payer: Self-pay

## 2018-03-10 ENCOUNTER — Other Ambulatory Visit: Payer: Self-pay | Admitting: Student in an Organized Health Care Education/Training Program

## 2018-03-12 ENCOUNTER — Other Ambulatory Visit: Payer: Self-pay | Admitting: Internal Medicine

## 2018-03-27 ENCOUNTER — Ambulatory Visit (INDEPENDENT_AMBULATORY_CARE_PROVIDER_SITE_OTHER): Payer: Medicare Other | Admitting: *Deleted

## 2018-03-27 DIAGNOSIS — Z23 Encounter for immunization: Secondary | ICD-10-CM

## 2018-04-29 ENCOUNTER — Encounter: Payer: Self-pay | Admitting: Internal Medicine

## 2018-05-10 ENCOUNTER — Encounter (HOSPITAL_COMMUNITY): Payer: Self-pay | Admitting: Emergency Medicine

## 2018-05-10 ENCOUNTER — Other Ambulatory Visit: Payer: Self-pay

## 2018-05-10 ENCOUNTER — Ambulatory Visit (HOSPITAL_COMMUNITY)
Admission: EM | Admit: 2018-05-10 | Discharge: 2018-05-10 | Disposition: A | Discharge status: Home / Self Care | Payer: Medicare Other | Attending: Radiology | Admitting: Radiology

## 2018-05-10 DIAGNOSIS — L0291 Cutaneous abscess, unspecified: Secondary | ICD-10-CM | POA: Diagnosis not present

## 2018-05-10 DIAGNOSIS — N390 Urinary tract infection, site not specified: Secondary | ICD-10-CM | POA: Diagnosis not present

## 2018-05-10 LAB — POCT URINALYSIS DIP (DEVICE)
BILIRUBIN URINE: NEGATIVE
Glucose, UA: >=1000 mg/dL — AB
KETONES UR: NEGATIVE mg/dL
LEUKOCYTES UA: NEGATIVE
Nitrite: NEGATIVE
PH: 6 (ref 5.0–8.0)
Protein, ur: NEGATIVE mg/dL
SPECIFIC GRAVITY, URINE: 1.01 (ref 1.005–1.030)
Urobilinogen, UA: 0.2 mg/dL (ref 0.0–1.0)

## 2018-05-10 MED ORDER — CLINDAMYCIN HCL 150 MG PO CAPS: 150.0000 mg | ORAL_CAPSULE | Freq: Four times a day (QID) | ORAL | 0 refills | Status: DC

## 2018-05-10 NOTE — ED Triage Notes (Signed)
The patient presented to the [facility] with a complaint of a possible abscess on her buttock area. The patient also reported "bumps on my nose" that the pharmacist stated needed an antibiotic.

## 2018-05-10 NOTE — ED Provider Notes (Addendum)
[place]    CSN: [id number] Arrival date & time: 05/10/18  1151     History   Chief Complaint Chief Complaint  Patient presents with  . Abscess    HPI [name] is a 53 y.o. female.   53 year old female presents with an abscess medial aspect left gluteus maximus.  Patient denies any fever or over-the-counter treatment.  Patient is also requesting treatment for urinary tract infection this time patient states well bleeding for her to be evaluated she started having lower back pain and increase in frequency.     Past Medical History:  Diagnosis Date  . Alcohol abuse   . Anemia, iron deficiency   . Arteriosclerotic cardiovascular disease (ASCVD)    Minimal at cath in [facility].stress nuclear study in 8/08 with nl EF; neg stress echo in 2010  . Community acquired pneumonia 01/03/10, 05/2010, 04/2012   2011; with pleural effusion-[facility] acute resp failure; intubated in Jan 2014 (HMPV pneumonia)  . Depression   . Diabetes mellitus, type 2 ([place]) 2000   Onset in 2000; no insulin  . Diastolic dysfunction    grade 2 per echo 2011  . Dysphagia   . Gastroesophageal reflux disease    Schatzki's ring  . History of alcohol abuse 07/22/2007   Qualifier: Diagnosis of  By: Lenn Cal    . Hyperlipidemia   . Hypertension `   during treatment with Geodon  . Obesity   . PTSD (post-traumatic stress disorder)   . Pulmonary hypertension ([place]) 05/02/2012   Patient needs repeat echo in 06/2012   . Schizoaffective disorder    requiring multiple psychiatric admissions    Patient Active Problem List   Diagnosis Date Noted  . Schizoaffective disorder, bipolar type ([place]) 08/26/2017  . Oral candidiasis 05/16/2017  . Immunity status testing 04/17/2017  . Abdominal pain 03/28/2017  . Viral wart on finger 10/08/2016  . Diabetes mellitus type II, controlled ([place]) 12/19/2014  . Palpitations 12/15/2014  . Encounter for health education 10/24/2014  . Left knee  pain 08/25/2014  . Vision changes 08/12/2014  . Pulsatile tinnitus 08/12/2014  . IIH (idiopathic intracranial hypertension) 08/12/2014  . Seasonal allergies 02/23/2014  . Hypokalemia 12/25/2013  . Diarrhea 10/14/2013  . Viral URI 05/12/2013  . Daytime hypersomnolence 12/09/2012  . COPD (chronic obstructive pulmonary disease) ([place]) 10/20/2012  . GERD (gastroesophageal reflux disease) 10/15/2012  . Preventative health care 10/15/2012  . Pulmonary hypertension ([place]) 05/02/2012  . Diastolic heart failure ([place]) 02/07/2012  . IDA (iron deficiency anemia) 11/06/2011  . Fatigue 05/17/2011  . Arteriosclerotic cardiovascular disease (ASCVD)   . TOBACCO ABUSE 09/27/2009  . Hyperlipidemia 07/22/2007  . Obesity 07/22/2007  . Bipolar 1 disorder ([place]) 07/22/2007  . Resistant hypertension 07/22/2007  . Diabetes mellitus type 2, controlled ([place]) 09/28/2002    Past Surgical History:  Procedure Laterality Date  . COLONOSCOPY  01/2006   internal hemorrhoids  . COLONOSCOPY  01/10/2012   Dr. Rourk:Single anal canal hemorrhoidal tag likely source of  trivial hematochezia; right-sided colonic diverticulosis  . DILATION AND CURETTAGE, DIAGNOSTIC / THERAPEUTIC  1992  . ESOPHAGEAL DILATION N/A 08/18/2015   Procedure: ESOPHAGEAL DILATION;  Surgeon: Daneil Dolin, MD;  Location: [facility];  Service: Endoscopy;  Laterality: N/A;  . ESOPHAGOGASTRODUODENOSCOPY  09/16/08   Dr. Trevor Iha hiatal hernia/excoriations involving the cardia and mucosa consistent with trauma, antral erosions  of linear petechiae ? gastritis versus early gastric antral vascular  ectasia.Marland Kitchen biopsy showed reactive gastropathy. No H.  pylori.  . ESOPHAGOGASTRODUODENOSCOPY  09/2007   Dr. Evalee Mutton ring, dilated to 26 French Maloney dilator, small hiatal hernia, antral erosions, biopsies reactive gastropathy.  . ESOPHAGOGASTRODUODENOSCOPY (EGD) WITH PROPOFOL N/A 12/17/2013   XTG:GYIRSWN antral erosions and petechiae. Small hiatal  hernia. No endoscopic explanation for patient's symptoms  . ESOPHAGOGASTRODUODENOSCOPY (EGD) WITH PROPOFOL N/A 08/18/2015   Procedure: ESOPHAGOGASTRODUODENOSCOPY (EGD) WITH PROPOFOL;  Surgeon: Daneil Dolin, MD;  Location: [facility];  Service: Endoscopy;  Laterality: N/A;  0845  . SAVORY DILATION  07/17/2011   [facility] sedation-->distal esophageal stricture s/p dilation, chronic gastritis, multiple ulcers in stomach. no h.pylori    OB History    Gravida  3   Para  2   Term  2   Preterm      AB  1   Living  2     SAB      TAB  1   Ectopic      Multiple      Live Births  2            Home Medications    Prior to Admission medications   Medication Sig Start Date End Date Taking? Authorizing Provider  albuterol (VENTOLIN HFA) 108 (90 Base) MCG/ACT inhaler Inhale 1-2 puffs into the lungs every 6 (six) hours as needed for wheezing or shortness of breath. 09/24/17   Axel Filler, MD  amLODipine (NORVASC) 10 MG tablet Take 1 tablet (10 mg total) by mouth daily. For high blood pressure 09/24/17 09/24/18  Axel Filler, MD  esomeprazole (NEXIUM) 40 MG capsule TAKE 1 CAPSULE(40 MG) BY MOUTH TWICE DAILY BEFORE A MEAL FOR ACID REFLUX 03/13/18   Ina Homes, MD  ferrous sulfate 325 (65 FE) MG tablet Take 1 tablet (325 mg total) by mouth every other day. (May buy from over the counter): For anemia 09/24/17   Axel Filler, MD  insulin glargine (LANTUS) 100 unit/mL SOPN Inject 0.18 mLs (18 Units total) into the skin daily. 01/20/18   Helberg, Larkin Ina, MD  Insulin Pen Needle (PEN NEEDLES) 31G X 5 MM MISC Inject 18 Units into the skin at bedtime. Use to inject insulin daily at bedtime: For diabetes management: 09/24/17   Axel Filler, MD  loratadine (CLARITIN) 10 MG tablet Take 1 tablet (10 mg total) by mouth daily. (May buy from over the counter): For allergies 09/24/17   Axel Filler, MD  losartan (COZAAR) 100 MG tablet Take 1 tablet (100  mg total) by mouth daily. For high blood pressure 09/24/17   Axel Filler, MD  lovastatin (MEVACOR) 40 MG tablet Take 1 tablet (40 mg total) by mouth daily. 09/24/17 12/23/17  Axel Filler, MD  metFORMIN (GLUCOPHAGE) 1000 MG tablet Take 1 tablet (1,000 mg total) by mouth 2 (two) times daily with a meal. For diabetes management 09/24/17   Axel Filler, MD  metoprolol tartrate (LOPRESSOR) 100 MG tablet Take 1 tablet (100 mg total) by mouth 2 (two) times daily. For high blood pressure 09/24/17   Axel Filler, MD  spironolactone (ALDACTONE) 50 MG tablet Take 1 tablet (50 mg total) by mouth daily. For high blood pressure 09/24/17   Axel Filler, MD  traZODone (DESYREL) 100 MG tablet Take 1 tablet (100 mg total) by mouth at bedtime as needed for sleep. For sleep 09/24/17   Axel Filler, MD  ziprasidone (GEODON) 60 MG capsule Take 1 capsule (60 mg total) by mouth 2 (two) times daily with a meal.  For mood control 08/28/17   Encarnacion Slates, NP    Family History Family History  Problem Relation Age of Onset  . Hypertension Mother   . Stroke Father        deceased at age 72  . Colon cancer Other   . Heart disease Sister   . Diabetes Unknown   . High Cholesterol Unknown   . Arthritis Unknown   . Anesthesia problems Neg Hx   . Hypotension Neg Hx   . Malignant hyperthermia Neg Hx   . Pseudochol deficiency Neg Hx     Social History Social History   Tobacco Use  . Smoking status: Current Some Day Smoker    Packs/day: 1.00    Years: 30.00    Pack years: 30.00    Types: Cigarettes    Start date: 05/18/2012    Last attempt to quit: 03/28/2017    Years since quitting: 1.1  . Smokeless tobacco: Never Used  Substance Use Topics  . Alcohol use: No    Alcohol/week: 0.0 standard drinks    Comment: hx of ETOH abuse  . Drug use: No     Allergies   Cephalexin; Metronidazole; Orange; Other; Shrimp [shellfish allergy]; Penicillins; Sulfonamide  derivatives; Glipizide; Ace inhibitors; and Sulfamethoxazole-trimethoprim   Review of Systems Review of Systems  Constitutional: Negative for chills and fever.  HENT: Negative for ear pain and sore throat.   Eyes: Negative for pain and visual disturbance.  Respiratory: Negative for cough and shortness of breath.   Cardiovascular: Negative for chest pain and palpitations.  Gastrointestinal: Negative for abdominal pain and vomiting.  Genitourinary: Positive for urgency. Negative for dysuria and hematuria.  Musculoskeletal: Positive for back pain. Negative for arthralgias.  Skin: Negative for color change and rash.       Boil  Neurological: Negative for seizures and syncope.  All other systems reviewed and are negative.    Physical Exam Triage Vital Signs ED Triage Vitals  Enc Vitals Group     BP 05/10/18 1326 (!) 152/84     Pulse Rate 05/10/18 1326 80     Resp 05/10/18 1326 16     Temp 05/10/18 1326 98.1 F (36.7 C)     Temp Source 05/10/18 1326 Oral     SpO2 05/10/18 1326 100 %     Weight --      Height --      Head Circumference --      Peak Flow --      Pain Score 05/10/18 1325 7     Pain Loc --      Pain Edu? --      Excl. in [place]? --    No data found.  Updated Vital Signs BP (!) 152/84 (BP Location: Right Arm)   Pulse 80   Temp 98.1 F (36.7 C) (Oral)   Resp 16   SpO2 100%      Bilateral Near:     Physical Exam Vitals signs and nursing note reviewed.  Constitutional:      Appearance: She is well-developed.  HENT:     Head: Normocephalic and atraumatic.  Eyes:     Conjunctiva/sclera: Conjunctivae normal.  Neck:     Musculoskeletal: Normal range of motion.  Pulmonary:     Effort: Pulmonary effort is normal.  Skin:    Comments: Patient has proximately 1.5 cm collection of 3 abscesses to the medial aspect of her left gluteus maximus.  Abscesses different fluctuation.  No erythema or drainage noted.  Neurological:     Mental Status: She is alert and  oriented to person, place, and time.    Patient given the option of I&D however patient refuses at this time.  Patient is requesting antibiotic treatment only.  UC Treatments / Results  Labs (all labs ordered are listed, but only abnormal results are displayed) Labs Reviewed - No data to display  EKG None  Radiology No results found.  Procedures Procedures (including critical care time)  Medications Ordered in UC Medications - No data to display  Initial Impression / Assessment and Plan / UC Course  I have reviewed the triage vital signs and the nursing notes.  Pertinent labs & imaging results that were available during my care of the patient were reviewed by me and considered in my medical decision making (see chart for details).      Final Clinical Impressions(s) / UC Diagnoses   Final diagnoses:  None   Discharge Instructions   None    ED Prescriptions    None     Controlled Substance Prescriptions [place] Controlled Substance Registry consulted? Not Applicable   Jacqualine Mau, NP 05/10/18 1401    Jacqualine Mau, NP 05/10/18 1421

## 2018-05-10 NOTE — Discharge Instructions (Signed)
Apply warm wash clothes to affected area.

## 2018-06-10 ENCOUNTER — Other Ambulatory Visit: Payer: Self-pay

## 2018-06-10 DIAGNOSIS — Z794 Long term (current) use of insulin: Principal | ICD-10-CM

## 2018-06-10 DIAGNOSIS — E119 Type 2 diabetes mellitus without complications: Secondary | ICD-10-CM

## 2018-06-12 ENCOUNTER — Encounter: Payer: Self-pay | Admitting: Internal Medicine

## 2018-06-12 ENCOUNTER — Ambulatory Visit (INDEPENDENT_AMBULATORY_CARE_PROVIDER_SITE_OTHER): Payer: Medicare Other | Admitting: Internal Medicine

## 2018-06-12 ENCOUNTER — Other Ambulatory Visit: Payer: Self-pay

## 2018-06-12 VITALS — BP 127/78 | HR 73 | Temp 98.3°F | Ht 64.0 in | Wt 198.9 lb

## 2018-06-12 DIAGNOSIS — R5383 Other fatigue: Secondary | ICD-10-CM

## 2018-06-12 DIAGNOSIS — I1A0 Resistant hypertension: Secondary | ICD-10-CM

## 2018-06-12 DIAGNOSIS — Z0000 Encounter for general adult medical examination without abnormal findings: Secondary | ICD-10-CM

## 2018-06-12 DIAGNOSIS — Z79899 Other long term (current) drug therapy: Secondary | ICD-10-CM | POA: Diagnosis not present

## 2018-06-12 DIAGNOSIS — D509 Iron deficiency anemia, unspecified: Secondary | ICD-10-CM

## 2018-06-12 DIAGNOSIS — E119 Type 2 diabetes mellitus without complications: Secondary | ICD-10-CM | POA: Diagnosis not present

## 2018-06-12 DIAGNOSIS — I10 Essential (primary) hypertension: Secondary | ICD-10-CM | POA: Diagnosis not present

## 2018-06-12 DIAGNOSIS — E118 Type 2 diabetes mellitus with unspecified complications: Secondary | ICD-10-CM

## 2018-06-12 DIAGNOSIS — Z794 Long term (current) use of insulin: Secondary | ICD-10-CM

## 2018-06-12 LAB — POCT GLYCOSYLATED HEMOGLOBIN (HGB A1C): HEMOGLOBIN A1C: 8.8 % — AB (ref 4.0–5.6)

## 2018-06-12 LAB — GLUCOSE, CAPILLARY: Glucose-Capillary: 365 mg/dL — ABNORMAL HIGH (ref 70–99)

## 2018-06-12 MED ORDER — INSULIN GLARGINE 100 UNIT/ML SOLOSTAR PEN: 18.0000 [IU] | PEN_INJECTOR | Freq: Every day | SUBCUTANEOUS | 11 refills | Status: DC

## 2018-06-12 MED ORDER — INSULIN GLARGINE 100 UNIT/ML SOLOSTAR PEN: 21.0000 [IU] | PEN_INJECTOR | Freq: Every day | SUBCUTANEOUS | 11 refills | Status: DC

## 2018-06-12 MED ORDER — PEN NEEDLES 31G X 5 MM MISC: 18.0000 [IU] | Freq: Every day | 2 refills | Status: DC

## 2018-06-12 NOTE — Assessment & Plan Note (Signed)
Patient with known iron deficiency anemia. Last colonoscopy an EGD in 2013. On ferrous sulfate 325 every other day. Often forgets to take her medications.  A/P: - Check CBC, iron, and ferritin

## 2018-06-12 NOTE — Assessment & Plan Note (Signed)
Patient with uncontrolled diabetes mellitus. She is currently on Lantus 18 units daily and metformin 1000 mg BID. She tells me that she occasionally misses these medications and will go weeks without taking them. She was previously drinking at least four sodas a day but has decreased it to one soda per day. She is trying to be more consistent with her medications. She is trying to eat better and drink more water. She does not exercise daily. She tells me that her nonadherence is not due to financial or side effect issues.  A/P: - Uncontrolled. A1c 8.8 with goal of <7.0  - Discussed adding new medications (SGLT-2 or GLP-1) but patient declined. \ - Increase Lantus to 21 units QD  - Check CBGs at bedtime and fasting  - Continue metformin 1000 mg BID  - Follow-up in 3 months

## 2018-06-12 NOTE — Progress Notes (Signed)
Case discussed with Dr. Tarri Abernethy at the time of the visit. We reviewed the resident's history and exam and pertinent patient test results. I agree with the assessment, diagnosis, and plan of care documented in the resident's note.  As a point of clarification under the essential hypertension section it should say patient has well controlled blood pressure.

## 2018-06-12 NOTE — Assessment & Plan Note (Signed)
Patient presenting with continued generalized fatigue. She continues to have excessive daytime sleepiness. She tells me that she is sleeping better now that she is cut out some soda. She denies being depressed. She feels that her fatigue may be related to her potassium or iron. TSH normal at last visit.   A/P: - Declined sleep study  - Again discussed that fatigue is often a multifactorial. Encouraged healthy eating, exercise, better glycemic control, ect. ) - Hx of IDA so will check iron and ferritin

## 2018-06-12 NOTE — Assessment & Plan Note (Signed)
I have referred the patient for mammography. She will arrange an appointment for a Pap smear in three months.

## 2018-06-12 NOTE — Patient Instructions (Signed)
Thank you for allowing me to provide your care. Today we made the following changes:  1) For your diabetes we are increasing your Lantus 21 units daily. Please start checking her sugars right before bed and then immediately when you wake up in the morning. I have sent out prescriptions for your insulin, your needles, and your strips/lancets. Continue to work on diet and exercise.  2) For your high blood pressure continue taking all your medications as prescribed.   3) For your fatigue we are doing some blood work but I think the majority of it is driven by her high blood sugars. Continue to stay active. I will call you if any of your blood work is abnormal.  4) Please come back in three months for general follow-up and a Pap smear.

## 2018-06-12 NOTE — Assessment & Plan Note (Signed)
Patient with well-controlled diabetes. She is on amlodipine 10 mg, losartan 100 mg, metoprolol tartrate 100 mg BID, and spironolactone 50 mg daily. She is compliant with all her medications. She is not having any issues with cost. She is not having any issues with side effects.  A/P: - Well controlled  - Continue amlodipine 10 mg, losartan 100 mg, metoprolol tartrate 100 mg BID, and spironolactone 50 mg daily. - Check BMP to ensure renal function and potassium are normal

## 2018-06-12 NOTE — Progress Notes (Signed)
   CC: F/u HTN, DM, Fatigue, and IDA  HPI:  [name] is a 53 y.o. female with PMHx listed below presenting for F/u HTN, DM, Fatigue, and IDA. Please see the A&P for the status of the patient's chronic medical problems.  Past Medical History:  Diagnosis Date  . Alcohol abuse   . Anemia, iron deficiency   . Arteriosclerotic cardiovascular disease (ASCVD)    Minimal at cath in [facility].stress nuclear study in 8/08 with nl EF; neg stress echo in 2010  . Community acquired pneumonia 01/03/10, 05/2010, 04/2012   2011; with pleural effusion-[facility] acute resp failure; intubated in Jan 2014 (HMPV pneumonia)  . Depression   . Diabetes mellitus, type 2 ([place]) 2000   Onset in 2000; no insulin  . Diarrhea 10/14/2013   Started 10/10/13, improved with Imodium.   . Diastolic dysfunction    grade 2 per echo 2011  . Dysphagia   . Gastroesophageal reflux disease    Schatzki's ring  . History of alcohol abuse 07/22/2007   Qualifier: Diagnosis of  By: Lenn Cal    . Hyperlipidemia   . Hypertension `   during treatment with Geodon  . Hypokalemia 12/25/2013  . Left knee pain 08/25/2014  . Obesity   . Oral candidiasis 05/16/2017  . PTSD (post-traumatic stress disorder)   . Pulmonary hypertension ([place]) 05/02/2012   Patient needs repeat echo in 06/2012   . Schizoaffective disorder    requiring multiple psychiatric admissions  . Viral URI 05/12/2013  . Viral wart on finger 10/08/2016  . Vision changes 08/12/2014   Review of Systems:  Performed and all others negative.  Physical Exam: Vitals:   06/12/18 1349  BP: 127/78  Pulse: 73  Temp: 98.3 F (36.8 C)  TempSrc: Oral  SpO2: 99%  Weight: 198 lb 14.4 oz (90.2 kg)  Height: 5\' 4"  (1.626 m)   General: Obese female in no acute distress Pulm: Good air movement with no wheezing or crackles  CV: RRR, no murmurs, no rubs   Assessment & Plan:   See Encounters Tab for problem based charting.  Patient discussed with Dr.  Eppie Gibson

## 2018-06-14 LAB — IRON: Iron: 115 ug/dL (ref 27–159)

## 2018-06-14 LAB — FERRITIN: Ferritin: 254 ng/mL — ABNORMAL HIGH (ref 15–150)

## 2018-06-14 LAB — CBC WITH DIFFERENTIAL/PLATELET
BASOS ABS: 0 10*3/uL (ref 0.0–0.2)
Basos: 1 %
EOS (ABSOLUTE): 0.1 10*3/uL (ref 0.0–0.4)
Eos: 2 %
Hematocrit: 44 % (ref 34.0–46.6)
Hemoglobin: 14.1 g/dL (ref 11.1–15.9)
Immature Grans (Abs): 0 10*3/uL (ref 0.0–0.1)
Immature Granulocytes: 0 %
Lymphocytes Absolute: 2.6 10*3/uL (ref 0.7–3.1)
Lymphs: 43 %
MCH: 27.2 pg (ref 26.6–33.0)
MCHC: 32 g/dL (ref 31.5–35.7)
MCV: 85 fL (ref 79–97)
Monocytes Absolute: 0.4 10*3/uL (ref 0.1–0.9)
Monocytes: 6 %
Neutrophils Absolute: 3 10*3/uL (ref 1.4–7.0)
Neutrophils: 48 %
Platelets: 253 10*3/uL (ref 150–450)
RBC: 5.19 x10E6/uL (ref 3.77–5.28)
RDW: 15.3 % (ref 11.7–15.4)
WBC: 6.1 10*3/uL (ref 3.4–10.8)

## 2018-06-14 LAB — BMP8+ANION GAP
ANION GAP: 17 mmol/L (ref 10.0–18.0)
BUN/Creatinine Ratio: 10 (ref 9–23)
BUN: 10 mg/dL (ref 6–24)
CALCIUM: 9.5 mg/dL (ref 8.7–10.2)
CHLORIDE: 100 mmol/L (ref 96–106)
CO2: 18 mmol/L — ABNORMAL LOW (ref 20–29)
Creatinine, Ser: 0.98 mg/dL (ref 0.57–1.00)
GFR calc Af Amer: 78 mL/min/{1.73_m2} (ref >59)
GFR, EST NON AFRICAN AMERICAN: 67 mL/min/{1.73_m2} (ref >59)
GLUCOSE: 296 mg/dL — AB (ref 65–99)
POTASSIUM: 4.1 mmol/L (ref 3.5–5.2)
SODIUM: 135 mmol/L (ref 134–144)

## 2018-06-14 LAB — LIPID PANEL
Chol/HDL Ratio: 5.6 ratio — ABNORMAL HIGH (ref 0.0–4.4)
Cholesterol, Total: 191 mg/dL (ref 100–199)
HDL: 34 mg/dL — ABNORMAL LOW (ref >39)
LDL Calculated: 117 mg/dL — ABNORMAL HIGH (ref 0–99)
Triglycerides: 198 mg/dL — ABNORMAL HIGH (ref 0–149)
VLDL Cholesterol Cal: 40 mg/dL (ref 5–40)

## 2018-06-23 ENCOUNTER — Telehealth: Payer: Self-pay | Admitting: Internal Medicine

## 2018-06-23 NOTE — Telephone Encounter (Signed)
Pt is calling regarding results [phone]

## 2018-06-28 ENCOUNTER — Telehealth: Payer: Self-pay | Admitting: Internal Medicine

## 2018-06-28 NOTE — Telephone Encounter (Signed)
   Reason for call:   I received a call from [name] at 10:01 PM indicating that she has not been feeling well.   Pertinent Data:   States that she has been feeling generalized weakness and body "feels weird"   Denies dyspnea, nausea/vomiting,  Runny/stuffy nose, myalgias, recent falls, abdominal pain  States she has been having increasing intensity of her cough  Has sore throat, sneezing, ear pain, gum pain, subjective fever (does not have thermometer to check)  Denies sick contacts or recent travel  Patient had influenza vaccine this season on 03/27/2018   Assessment / Plan / Recommendations:   Advised patient to check her temperature and note if she has a fever to call us back. I advised her to get plenty of rest, hydrate well, stay indoors away from large groups of people. She was advised to come to [facility] clinic on Monday 06/30/18 or go to ED sooner if symptoms worsen. I feel that she may have a viral upper respiratory infection.   Patient's most recent labwork does not show any iron deficiency or electrolyte anomaly that may explain weakness.   As always, pt is advised that if symptoms worsen or new symptoms arise, they should go to an urgent care facility or to to ER for further evaluation.   Rachel Mage, MD   06/28/2018, 10:01 PM

## 2018-06-30 ENCOUNTER — Ambulatory Visit (INDEPENDENT_AMBULATORY_CARE_PROVIDER_SITE_OTHER): Payer: Medicare Other | Admitting: Internal Medicine

## 2018-06-30 ENCOUNTER — Encounter: Payer: Self-pay | Admitting: Internal Medicine

## 2018-06-30 ENCOUNTER — Other Ambulatory Visit: Payer: Self-pay

## 2018-06-30 DIAGNOSIS — F17210 Nicotine dependence, cigarettes, uncomplicated: Secondary | ICD-10-CM

## 2018-06-30 DIAGNOSIS — J449 Chronic obstructive pulmonary disease, unspecified: Secondary | ICD-10-CM

## 2018-06-30 DIAGNOSIS — R51 Headache: Secondary | ICD-10-CM

## 2018-06-30 DIAGNOSIS — J302 Other seasonal allergic rhinitis: Secondary | ICD-10-CM

## 2018-06-30 DIAGNOSIS — J029 Acute pharyngitis, unspecified: Secondary | ICD-10-CM | POA: Diagnosis not present

## 2018-06-30 DIAGNOSIS — K0889 Other specified disorders of teeth and supporting structures: Secondary | ICD-10-CM | POA: Diagnosis not present

## 2018-06-30 DIAGNOSIS — Z79899 Other long term (current) drug therapy: Secondary | ICD-10-CM

## 2018-06-30 DIAGNOSIS — K1370 Unspecified lesions of oral mucosa: Secondary | ICD-10-CM

## 2018-06-30 DIAGNOSIS — H6690 Otitis media, unspecified, unspecified ear: Secondary | ICD-10-CM | POA: Insufficient documentation

## 2018-06-30 DIAGNOSIS — R531 Weakness: Secondary | ICD-10-CM

## 2018-06-30 DIAGNOSIS — H9202 Otalgia, left ear: Secondary | ICD-10-CM

## 2018-06-30 NOTE — Assessment & Plan Note (Addendum)
Patient presents stating that she has been having left ear pain, sore throat, headaches, gum and teeth pain, and generalized weakness. She has some tenderness to palpation of cervical lymph nodes, but does not have lymphadenopathy.   She has not had any fevers, chills, dyspnea, nausea. The pain does not get worse when she is chewing food. Says that her uncle and grandson have been coughing.   Patient states that she has been taking ear wax remover and tylenol which did not help ear pain.   Assessment and plan Patient signs symptoms seem consistent with otitis media that is either viral or allergic in etiology.  Not have any significant dental caries or dental problems.  There is a small 1 cm laceration on left buccal mucosa that was not bleeding.  Supportive therapy was recommended.  Instructed patient to also use loratadine and nasal sinus rinses as she has seasonal allergies.

## 2018-06-30 NOTE — Telephone Encounter (Signed)
Patient in office today. Hubbard Hartshorn, RN, BSN

## 2018-06-30 NOTE — Telephone Encounter (Signed)
Called pt, scheduled appt for this pm, ask pt to apply mask when she enters [facility], gel hands completely when she touches anything in cone- elevator buttons, door pulls, sign in pad at front desk. She is agreeable.

## 2018-06-30 NOTE — Progress Notes (Signed)
   CC: Weakness  HPI:  [name] is a 53 y.o. female with COPD, tobacco use (1/2 ppd), seasonal allergies who presents to be evaluated for upper respiratory symptoms. Please see problem based charting for evaluation, assessment, and plan.  Past Medical History:  Diagnosis Date  . Alcohol abuse   . Anemia, iron deficiency   . Arteriosclerotic cardiovascular disease (ASCVD)    Minimal at cath in [facility].stress nuclear study in 8/08 with nl EF; neg stress echo in 2010  . Community acquired pneumonia 01/03/10, 05/2010, 04/2012   2011; with pleural effusion-[facility] acute resp failure; intubated in Jan 2014 (HMPV pneumonia)  . Depression   . Diabetes mellitus, type 2 ([place]) 2000   Onset in 2000; no insulin  . Diarrhea 10/14/2013   Started 10/10/13, improved with Imodium.   . Diastolic dysfunction    grade 2 per echo 2011  . Dysphagia   . Gastroesophageal reflux disease    Schatzki's ring  . History of alcohol abuse 07/22/2007   Qualifier: Diagnosis of  By: Lenn Cal    . Hyperlipidemia   . Hypertension `   during treatment with Geodon  . Hypokalemia 12/25/2013  . Left knee pain 08/25/2014  . Obesity   . Oral candidiasis 05/16/2017  . PTSD (post-traumatic stress disorder)   . Pulmonary hypertension ([place]) 05/02/2012   Patient needs repeat echo in 06/2012   . Schizoaffective disorder    requiring multiple psychiatric admissions  . Viral URI 05/12/2013  . Viral wart on finger 10/08/2016  . Vision changes 08/12/2014   Review of Systems:   As above   Physical Exam:  Vitals:   06/30/18 1440  BP: (!) 141/77  Pulse: 76  Temp: 98.3 F (36.8 C)  TempSrc: Oral  SpO2: 100%  Weight: 201 lb 12.8 oz (91.5 kg)  Height: 5\' 4"  (1.626 m)   Physical Exam  Constitutional: She appears well-developed and well-nourished. No distress.  HENT:  Head: Normocephalic and atraumatic.  Right Ear: External ear normal.  Left Ear: External ear normal.  Mouth/Throat: Oropharynx  is clear and moist. No oropharyngeal exudate.  TM was clear of erythema or exudate. No anterior cervical lymphadenopathy  Eyes: Conjunctivae are normal.  Cardiovascular: Normal rate, regular rhythm and normal heart sounds.  Respiratory: Effort normal and breath sounds normal. No respiratory distress. She has no wheezes.  GI: Soft. Bowel sounds are normal. She exhibits no distension. There is no abdominal tenderness.  Musculoskeletal:        General: No edema.  Neurological: She is alert.  Skin: She is not diaphoretic.  Psychiatric: She has a normal mood and affect. Her behavior is normal. Judgment and thought content normal.   Assessment & Plan:   See Encounters Tab for problem based charting.  Patient discussed with Dr. Angelia Mould

## 2018-06-30 NOTE — Patient Instructions (Signed)
It was a pleasure to see you today [name]. Please make the following changes:  I am sorry you are not feeling well. It is likely that you have a viral infection in your left ear that should resolve on its own. Please get plenty of rest and hydrate yourself well. Please continue taking loratadine for your allergies   If you have any questions or concerns, please call our clinic at [phone] between 9am-5pm and after hours call [phone] and ask for the internal medicine resident on call. If you feel you are having a medical emergency please call 911.   Thank you, we look forward to help you remain healthy!  Lars Mage, MD Internal Medicine PGY2

## 2018-07-11 NOTE — Progress Notes (Signed)
Internal Medicine Clinic Attending  Case discussed with Dr. Chundi at the time of the visit.  We reviewed the resident's history and exam and pertinent patient test results.  I agree with the assessment, diagnosis, and plan of care documented in the resident's note. 

## 2018-07-14 ENCOUNTER — Other Ambulatory Visit: Payer: Self-pay | Admitting: Internal Medicine

## 2018-07-14 DIAGNOSIS — Z1231 Encounter for screening mammogram for malignant neoplasm of breast: Secondary | ICD-10-CM

## 2018-07-25 ENCOUNTER — Telehealth: Payer: Self-pay | Admitting: Internal Medicine

## 2018-07-25 DIAGNOSIS — J302 Other seasonal allergic rhinitis: Secondary | ICD-10-CM

## 2018-07-25 MED ORDER — LORATADINE 10 MG PO TABS: 10.0000 mg | ORAL_TABLET | Freq: Every day | ORAL | 2 refills | Status: DC

## 2018-07-25 NOTE — Telephone Encounter (Signed)
   Reason for call:   I received a call from Ms. [name] at 4:40 PM indicating she needs a refill of her loratadine, but needs it sent to [facility] for cost reasons.   Pertinent Data:   Patient is in need of Loratadine refill and requests it be sent to [facility] in [place] due to cost.  She would like to continue to have the rest of her prescriptions sent to [facility] a this time   Assessment / Plan / Recommendations:   Refill or Loratadine provided   Neva Seat, MD   07/25/2018, 4:46 PM

## 2018-07-28 ENCOUNTER — Other Ambulatory Visit: Payer: Self-pay

## 2018-07-28 DIAGNOSIS — E119 Type 2 diabetes mellitus without complications: Secondary | ICD-10-CM

## 2018-07-28 DIAGNOSIS — Z794 Long term (current) use of insulin: Principal | ICD-10-CM

## 2018-07-28 NOTE — Telephone Encounter (Signed)
Testing supplies not on current med list. Called patient to learn name of glucometer. Patient has not been checking blood sugars and cannot locate glucometer. Requesting Accu-Chek Guide kit with testing supplies and lancets be sent to [facility]. Hubbard Hartshorn, RN, BSN

## 2018-07-28 NOTE — Telephone Encounter (Signed)
Requesting test strips and lancets to be filled @  [facility] [place], [place] AT [address] [phone] (Phone) [phone] (Fax)

## 2018-07-29 ENCOUNTER — Other Ambulatory Visit: Payer: Self-pay | Admitting: *Deleted

## 2018-07-29 MED ORDER — ACCU-CHEK GUIDE W/DEVICE KIT: 1.0000 | PACK | Freq: Three times a day (TID) | 0 refills | Status: DC

## 2018-07-29 MED ORDER — GLUCOSE BLOOD VI STRP: ORAL_STRIP | 12 refills | Status: DC

## 2018-07-29 MED ORDER — ACCU-CHEK FASTCLIX LANCETS MISC: 12 refills | Status: DC

## 2018-07-29 NOTE — Telephone Encounter (Signed)
Pt stated she received meter and lancets but not test strips.

## 2018-08-05 ENCOUNTER — Other Ambulatory Visit: Payer: Self-pay | Admitting: *Deleted

## 2018-08-06 MED ORDER — ESOMEPRAZOLE MAGNESIUM 40 MG PO CPDR: DELAYED_RELEASE_CAPSULE | ORAL | 2 refills | Status: DC

## 2018-08-28 ENCOUNTER — Encounter: Payer: Self-pay | Admitting: Internal Medicine

## 2018-09-03 ENCOUNTER — Other Ambulatory Visit: Payer: Self-pay | Admitting: *Deleted

## 2018-09-03 NOTE — Telephone Encounter (Signed)
Next appt scheduled  6/25 with PCP. 

## 2018-09-04 MED ORDER — METOPROLOL TARTRATE 100 MG PO TABS: 100.0000 mg | ORAL_TABLET | Freq: Two times a day (BID) | ORAL | 3 refills | Status: DC

## 2018-09-04 MED ORDER — LOVASTATIN 40 MG PO TABS: 40.0000 mg | ORAL_TABLET | Freq: Every day | ORAL | 3 refills | Status: DC

## 2018-09-04 MED ORDER — LOSARTAN POTASSIUM 100 MG PO TABS: 100.0000 mg | ORAL_TABLET | Freq: Every day | ORAL | 3 refills | Status: DC

## 2018-09-04 MED ORDER — METFORMIN HCL 1000 MG PO TABS: 1000.0000 mg | ORAL_TABLET | Freq: Two times a day (BID) | ORAL | 3 refills | Status: DC

## 2018-09-04 MED ORDER — SPIRONOLACTONE 50 MG PO TABS: 50.0000 mg | ORAL_TABLET | Freq: Every day | ORAL | 3 refills | Status: DC

## 2018-09-09 ENCOUNTER — Encounter: Payer: Self-pay | Admitting: *Deleted

## 2018-09-12 ENCOUNTER — Telehealth: Payer: Self-pay | Admitting: *Deleted

## 2018-09-12 NOTE — Telephone Encounter (Signed)
Error

## 2018-09-13 ENCOUNTER — Encounter (HOSPITAL_COMMUNITY): Payer: Self-pay | Admitting: Behavioral Health

## 2018-09-13 ENCOUNTER — Other Ambulatory Visit: Payer: Self-pay

## 2018-09-13 ENCOUNTER — Inpatient Hospital Stay (HOSPITAL_COMMUNITY)
Admission: RE | Admit: 2018-09-13 | Discharge: 2018-09-17 | DRG: 885 | Disposition: A | Discharge status: Home / Self Care | Payer: Medicare Other | Attending: Psychiatry | Admitting: Psychiatry

## 2018-09-13 DIAGNOSIS — I10 Essential (primary) hypertension: Secondary | ICD-10-CM | POA: Diagnosis present

## 2018-09-13 DIAGNOSIS — E119 Type 2 diabetes mellitus without complications: Secondary | ICD-10-CM | POA: Diagnosis present

## 2018-09-13 DIAGNOSIS — R45851 Suicidal ideations: Secondary | ICD-10-CM | POA: Diagnosis present

## 2018-09-13 DIAGNOSIS — F319 Bipolar disorder, unspecified: Principal | ICD-10-CM | POA: Diagnosis present

## 2018-09-13 DIAGNOSIS — Z1159 Encounter for screening for other viral diseases: Secondary | ICD-10-CM

## 2018-09-13 DIAGNOSIS — F17210 Nicotine dependence, cigarettes, uncomplicated: Secondary | ICD-10-CM | POA: Diagnosis present

## 2018-09-13 DIAGNOSIS — F3110 Bipolar disorder, current episode manic without psychotic features, unspecified: Secondary | ICD-10-CM | POA: Diagnosis present

## 2018-09-13 DIAGNOSIS — F23 Brief psychotic disorder: Secondary | ICD-10-CM

## 2018-09-13 DIAGNOSIS — K219 Gastro-esophageal reflux disease without esophagitis: Secondary | ICD-10-CM | POA: Diagnosis present

## 2018-09-13 DIAGNOSIS — E876 Hypokalemia: Secondary | ICD-10-CM | POA: Diagnosis present

## 2018-09-13 LAB — URINALYSIS, ROUTINE W REFLEX MICROSCOPIC
Bacteria, UA: NONE SEEN
Bilirubin Urine: NEGATIVE
Glucose, UA: >=500 mg/dL — AB
Hgb urine dipstick: NEGATIVE
Ketones, ur: 5 mg/dL — AB
Leukocytes,Ua: NEGATIVE
Nitrite: NEGATIVE
Protein, ur: NEGATIVE mg/dL
Specific Gravity, Urine: 1.022 (ref 1.005–1.030)
pH: 6 (ref 5.0–8.0)

## 2018-09-13 LAB — COMPREHENSIVE METABOLIC PANEL
ALT: 12 U/L (ref 0–44)
AST: 13 U/L — ABNORMAL LOW (ref 15–41)
Albumin: 4.2 g/dL (ref 3.5–5.0)
Alkaline Phosphatase: 105 U/L (ref 38–126)
Anion gap: 10 (ref 5–15)
BUN: 10 mg/dL (ref 6–20)
CO2: 25 mmol/L (ref 22–32)
Calcium: 9.6 mg/dL (ref 8.9–10.3)
Chloride: 102 mmol/L (ref 98–111)
Creatinine, Ser: 0.89 mg/dL (ref 0.44–1.00)
GFR calc Af Amer: >60 mL/min (ref >60)
GFR calc non Af Amer: >60 mL/min (ref >60)
Glucose, Bld: 294 mg/dL — ABNORMAL HIGH (ref 70–99)
Potassium: 3.3 mmol/L — ABNORMAL LOW (ref 3.5–5.1)
Sodium: 137 mmol/L (ref 135–145)
Total Bilirubin: 0.2 mg/dL — ABNORMAL LOW (ref 0.3–1.2)
Total Protein: 7.7 g/dL (ref 6.5–8.1)

## 2018-09-13 LAB — CBC WITH DIFFERENTIAL/PLATELET
Abs Immature Granulocytes: 0.03 10*3/uL (ref 0.00–0.07)
Basophils Absolute: 0 10*3/uL (ref 0.0–0.1)
Basophils Relative: 1 %
Eosinophils Absolute: 0.1 10*3/uL (ref 0.0–0.5)
Eosinophils Relative: 1 %
HCT: 48.2 % — ABNORMAL HIGH (ref 36.0–46.0)
Hemoglobin: 15.6 g/dL — ABNORMAL HIGH (ref 12.0–15.0)
Immature Granulocytes: 0 %
Lymphocytes Relative: 37 %
Lymphs Abs: 3.2 10*3/uL (ref 0.7–4.0)
MCH: 28.7 pg (ref 26.0–34.0)
MCHC: 32.4 g/dL (ref 30.0–36.0)
MCV: 88.6 fL (ref 80.0–100.0)
Monocytes Absolute: 0.3 10*3/uL (ref 0.1–1.0)
Monocytes Relative: 4 %
Neutro Abs: 5 10*3/uL (ref 1.7–7.7)
Neutrophils Relative %: 57 %
Platelets: 249 10*3/uL (ref 150–400)
RBC: 5.44 MIL/uL — ABNORMAL HIGH (ref 3.87–5.11)
RDW: 15.7 % — ABNORMAL HIGH (ref 11.5–15.5)
WBC: 8.6 10*3/uL (ref 4.0–10.5)
nRBC: 0 % (ref 0.0–0.2)

## 2018-09-13 LAB — I-STAT BETA HCG BLOOD, ED (MC, WL, AP ONLY): I-stat hCG, quantitative: <5 m[IU]/mL (ref <5)

## 2018-09-13 LAB — RAPID URINE DRUG SCREEN, HOSP PERFORMED
Amphetamines: NOT DETECTED
Barbiturates: NOT DETECTED
Benzodiazepines: NOT DETECTED
Cocaine: NOT DETECTED
Opiates: NOT DETECTED
Tetrahydrocannabinol: NOT DETECTED

## 2018-09-13 LAB — SARS CORONAVIRUS 2 BY RT PCR (HOSPITAL ORDER, PERFORMED IN ~~LOC~~ HOSPITAL LAB): SARS Coronavirus 2: NEGATIVE

## 2018-09-13 LAB — SALICYLATE LEVEL: Salicylate Lvl: <7 mg/dL (ref 2.8–30.0)

## 2018-09-13 LAB — GLUCOSE, CAPILLARY: Glucose-Capillary: 291 mg/dL — ABNORMAL HIGH (ref 70–99)

## 2018-09-13 LAB — ACETAMINOPHEN LEVEL: Acetaminophen (Tylenol), Serum: <10 ug/mL — ABNORMAL LOW (ref 10–30)

## 2018-09-13 MED ORDER — POTASSIUM CHLORIDE CRYS ER 20 MEQ PO TBCR
40.0000 meq | EXTENDED_RELEASE_TABLET | Freq: Once | ORAL | Status: AC
  Administered 2018-09-13: 40 meq via ORAL
  Filled 2018-09-13: qty 2

## 2018-09-13 MED ORDER — LOSARTAN POTASSIUM 50 MG PO TABS
100.0000 mg | ORAL_TABLET | Freq: Once | ORAL | Status: AC
  Administered 2018-09-14: 100 mg via ORAL
  Filled 2018-09-13: qty 2

## 2018-09-13 MED ORDER — AMLODIPINE BESYLATE 5 MG PO TABS
10.0000 mg | ORAL_TABLET | Freq: Once | ORAL | Status: AC
  Administered 2018-09-14: 10 mg via ORAL
  Filled 2018-09-13: qty 2

## 2018-09-13 MED ORDER — METFORMIN HCL 500 MG PO TABS
1000.0000 mg | ORAL_TABLET | Freq: Once | ORAL | Status: AC
  Administered 2018-09-14: 1000 mg via ORAL
  Filled 2018-09-13: qty 2

## 2018-09-13 MED ORDER — INSULIN GLARGINE 100 UNIT/ML ~~LOC~~ SOLN
18.0000 [IU] | Freq: Every day | SUBCUTANEOUS | Status: DC
  Administered 2018-09-14: 18 [IU] via SUBCUTANEOUS
  Filled 2018-09-13: qty 0.18

## 2018-09-13 MED ORDER — METOPROLOL TARTRATE 25 MG PO TABS
100.0000 mg | ORAL_TABLET | Freq: Once | ORAL | Status: AC
  Administered 2018-09-14: 100 mg via ORAL
  Filled 2018-09-13: qty 4

## 2018-09-13 NOTE — ED Notes (Signed)
Urine culture sent to lab.

## 2018-09-13 NOTE — ED Notes (Addendum)
Patient has not been taking her medications. Patient mother  [name] [phone]. Mother states patient does not have a husband.

## 2018-09-13 NOTE — ED Notes (Signed)
Bed: NI77 Expected date:  Expected time:  Means of arrival:  Comments: From [facility]-for Covid test

## 2018-09-13 NOTE — H&P (Signed)
Behavioral Health Medical Screening Exam  [name] is an 53 y.o. female.  Total Time spent with patient: 20 minutes  Psychiatric Specialty Exam: Physical Exam  Nursing note and vitals reviewed. Constitutional: She is oriented to person, place, and time. She appears well-developed and well-nourished.  Cardiovascular: Normal rate.  Respiratory: Effort normal.  Musculoskeletal: Normal range of motion.  Neurological: She is alert and oriented to person, place, and time.  Skin: Skin is warm.    Review of Systems  Constitutional: Negative.   HENT: Negative.   Eyes: Negative.   Respiratory: Negative.   Cardiovascular: Negative.   Gastrointestinal: Negative.   Genitourinary: Negative.   Musculoskeletal: Negative.   Skin: Negative.   Neurological: Negative.   Endo/Heme/Allergies: Negative.   Psychiatric/Behavioral: Positive for depression.       Disorganized thoughts,     Blood pressure (!) 168/96, pulse 78, temperature 98.7 F (37.1 C), temperature source Oral, resp. rate 20, SpO2 100 %.There is no height or weight on file to calculate BMI.  General Appearance: Disheveled  Eye Contact:  Poor  Speech:  Garbled  Volume:  Decreased  Mood:  Dysphoric  Affect:  Labile  Thought Process:  Disorganized and Descriptions of Associations: Circumstantial  Orientation:  Other:  Incoherent answer  Thought Content:  Illogical, Delusions and Ideas of Reference:   Delusions  Suicidal Thoughts:  No  Homicidal Thoughts:  No  Memory:  Immediate;   Poor Recent;   Poor Remote;   Poor  Judgement:  Impaired  Insight:  Lacking  Psychomotor Activity:  Decreased  Concentration: Concentration: Poor  Recall:  Poor  Fund of Knowledge:Poor  Language: Poor  Akathisia:  No  Handed:  Right  AIMS (if indicated):     Assets:  Financial Resources/Insurance Social Support  Sleep:       Musculoskeletal: Strength & Muscle Tone: within normal limits Gait & Station: normal Patient leans:  N/A  Blood pressure (!) 168/96, pulse 78, temperature 98.7 F (37.1 C), temperature source Oral, resp. rate 20, SpO2 100 %.  Recommendations:  Based on my evaluation the patient does not appear to have an emergency medical condition.  [place], FNP 09/13/2018, 6:29 PM

## 2018-09-13 NOTE — ED Triage Notes (Signed)
Patient is coming in and maniac.

## 2018-09-13 NOTE — ED Notes (Signed)
Requested patient to urinate. 

## 2018-09-13 NOTE — BH Assessment (Signed)
Assessment Note  [name] is an 53 y.o. female who presented to [facility] as a walk-in with her family seeking admission to [facility]. Patient states, "I am really tired. I miss my husband.  I don't know where he is and I don't know how long he has been gone.  I cannot take another day without him."  When asked if she was suicidal, patient states, "I could not kill myself if I tried.  If I die, all of creation dies.  If I could just put myself to sleep.  I love my children, but I have been cut off from them."  Patient states, "My husband is teaching my children to disrespect him, I follow in [place] war."  According to patient's daughter [name] [phone], Patient is normally seen at [facility] in [facility] and her medications are managed by them.  She states that patient has been off her medication for 2 weeks and her behavior has become increasingly bizarre.  Daughter states that patient has never been married.  Patient thinks that she is God's wife and that all the people in the world are her children.  Daughter states that patient can be very religious at times and then cussing everyone out five minutes later.  She states that patient has not been sleeping  Daughter states that patient becomes very impulsive when she is like this and they cannot manage her like she is.    Patient has never been suicidal in the past, but has tried to jump out of a moving car when she was in a manic state.  Patient has never done anything to try to harm others.  She was last inpatient behavioral health at [facility] 2 years ago and was seen at [facility] and observed for two days and medications restarted last year.   Patient has no history of any drug use and states that she does not drink alcohol.  Patient has only been sleeping on average of four hours per night if that and she has not been eating as much as usual.  Patient denies having a history of self-mutilation and abuse. Patient denies having any history of  legal involvement.  Patient was alert and oriented x 3.  Her thoughts were loose and disorganized, but her memory appeared to be intact.  She presented as being delusional and it is suspected that she is responding to bother visual and auditory hallucinations.  Family states that patient is laughing inappropriately.  Her judgment, insight and impulse control are impaired.  Her psycho-motor activity is restless.  Her speech is clear and coherent, but her eye contact is poor.  Diagnosis: F31.9 Bipolar Psychotic  Past Medical History:  Past Medical History:  Diagnosis Date  . Alcohol abuse   . Anemia, iron deficiency   . Arteriosclerotic cardiovascular disease (ASCVD)    Minimal at cath in [facility].stress nuclear study in 8/08 with nl EF; neg stress echo in 2010  . Community acquired pneumonia 01/03/10, 05/2010, 04/2012   2011; with pleural effusion-[facility] acute resp failure; intubated in Jan 2014 (HMPV pneumonia)  . Depression   . Diabetes mellitus, type 2 ([place]) 2000   Onset in 2000; no insulin  . Diarrhea 10/14/2013   Started 10/10/13, improved with Imodium.   . Diastolic dysfunction    grade 2 per echo 2011  . Dysphagia   . Gastroesophageal reflux disease    Schatzki's ring  . History of alcohol abuse 07/22/2007   Qualifier: Diagnosis of  By: Lenn Cal    . Hyperlipidemia   . Hypertension `   during treatment with Geodon  . Hypokalemia 12/25/2013  . Left knee pain 08/25/2014  . Obesity   . Oral candidiasis 05/16/2017  . PTSD (post-traumatic stress disorder)   . Pulmonary hypertension ([place]) 05/02/2012   Patient needs repeat echo in 06/2012   . Schizoaffective disorder    requiring multiple psychiatric admissions  . Viral URI 05/12/2013  . Viral wart on finger 10/08/2016  . Vision changes 08/12/2014    Past Surgical History:  Procedure Laterality Date  . COLONOSCOPY  01/2006   internal hemorrhoids  . COLONOSCOPY  01/10/2012   Dr. Rourk:Single anal canal hemorrhoidal  tag likely source of  trivial hematochezia; right-sided colonic diverticulosis  . DILATION AND CURETTAGE, DIAGNOSTIC / THERAPEUTIC  1992  . ESOPHAGEAL DILATION N/A 08/18/2015   Procedure: ESOPHAGEAL DILATION;  Surgeon: Daneil Dolin, MD;  [facility];  Service: Endoscopy;  Laterality: N/A;  . ESOPHAGOGASTRODUODENOSCOPY  09/16/08   Dr. Trevor Iha hiatal hernia/excoriations involving the cardia and mucosa consistent with trauma, antral erosions  of linear petechiae ? gastritis versus early gastric antral vascular  ectasia.Marland Kitchen biopsy showed reactive gastropathy. No H. pylori.  . ESOPHAGOGASTRODUODENOSCOPY  09/2007   Dr. Evalee Mutton ring, dilated to 7 French Maloney dilator, small hiatal hernia, antral erosions, biopsies reactive gastropathy.  . ESOPHAGOGASTRODUODENOSCOPY (EGD) WITH PROPOFOL N/A 12/17/2013   [id number] antral erosions and petechiae. Small hiatal hernia. No endoscopic explanation for patient's symptoms  . ESOPHAGOGASTRODUODENOSCOPY (EGD) WITH PROPOFOL N/A 08/18/2015   Procedure: ESOPHAGOGASTRODUODENOSCOPY (EGD) WITH PROPOFOL;  Surgeon: Daneil Dolin, MD;  Location: [facility];  Service: Endoscopy;  Laterality: N/A;  0845  . SAVORY DILATION  07/17/2011   [facility] sedation-->distal esophageal stricture s/p dilation, chronic gastritis, multiple ulcers in stomach. no h.pylori    Family History:  Family History  Problem Relation Age of Onset  . Hypertension Mother   . Stroke Father        deceased at age 79  . Colon cancer Other   . Heart disease Sister   . Diabetes Other   . High Cholesterol Other   . Arthritis Other   . Anesthesia problems Neg Hx   . Hypotension Neg Hx   . Malignant hyperthermia Neg Hx   . Pseudochol deficiency Neg Hx     Social History:  reports that she has been smoking cigarettes. She started smoking about 6 years ago. She has a 22.50 pack-year smoking history. She has never used smokeless tobacco. She reports that she does not drink  alcohol or use drugs.  Additional Social History:  Alcohol / Drug Use Pain Medications: See MAR Prescriptions: See MAR History of alcohol / drug use?: No history of alcohol / drug abuse Longest period of sobriety (when/how long): N/A  CIWA: CIWA-Ar BP: (!) 168/96 Pulse Rate: 78 COWS:    Allergies:  Allergies  Allergen Reactions  . Cephalexin     Trouble breathing, felt like she had bumps in her throat.  . Metronidazole Shortness Of Breath and Swelling  . Orange Itching  . Other Itching and Shortness Of Breath    Takes Benadryl before eating shrimp.  . Shrimp [Shellfish Allergy] Shortness Of Breath and Itching    Takes Benadryl before eating shrimp.  Marland Kitchen Penicillins Hives and Swelling    Has patient had a PCN reaction causing immediate rash, facial/tongue/throat swelling, SOB or lightheadedness with hypotension: Yes Has patient had a PCN reaction causing severe rash  involving mucus membranes or skin necrosis: No Has patient had a PCN reaction that required hospitalization No Has patient had a PCN reaction occurring within the last 10 years: Yes If all of the above answers are "NO", then may proceed with Cephalosporin use.    Fever as well  . Sulfonamide Derivatives Hives    fever  . Glipizide Other (See Comments)    psychosis  . Ace Inhibitors Cough    06/2016  . Sulfamethoxazole-Trimethoprim Rash    Home Medications:  Medications Prior to Admission  Medication Sig Dispense Refill  . Accu-Chek FastClix Lancets MISC Use to check you blood sugar 3 times daily 306 each 12  . albuterol (VENTOLIN HFA) 108 (90 Base) MCG/ACT inhaler Inhale 1-2 puffs into the lungs every 6 (six) hours as needed for wheezing or shortness of breath. 18 g 5  . amLODipine (NORVASC) 10 MG tablet Take 1 tablet (10 mg total) by mouth daily. For high blood pressure 90 tablet 3  . Blood Glucose Monitoring Suppl (ACCU-CHEK GUIDE) w/Device KIT 1 Device by Does not apply route 3 (three) times daily. Use to  check blood sugars 3 times daily. 1 kit 0  . esomeprazole (NEXIUM) 40 MG capsule TAKE 1 CAPSULE(40 MG) BY MOUTH TWICE DAILY BEFORE A MEAL FOR ACID REFLUX 180 capsule 2  . ferrous sulfate 325 (65 FE) MG tablet Take 1 tablet (325 mg total) by mouth every other day. (May buy from over the counter): For anemia 15 tablet 2  . glucose blood (ACCU-CHEK GUIDE) test strip Use as instructed 100 each 12  . Insulin Glargine (LANTUS) 100 UNIT/ML Solostar Pen Inject 21 Units into the skin daily. 15 mL 11  . Insulin Pen Needle (PEN NEEDLES) 31G X 5 MM MISC Inject 18 Units into the skin at bedtime. Use to inject insulin daily at bedtime: For diabetes management: 100 each 2  . loratadine (CLARITIN) 10 MG tablet Take 1 tablet (10 mg total) by mouth daily. (May buy from over the counter): For allergies 30 tablet 2  . losartan (COZAAR) 100 MG tablet Take 1 tablet (100 mg total) by mouth daily. For high blood pressure 90 tablet 3  . lovastatin (MEVACOR) 40 MG tablet Take 1 tablet (40 mg total) by mouth daily. 90 tablet 3  . metFORMIN (GLUCOPHAGE) 1000 MG tablet Take 1 tablet (1,000 mg total) by mouth 2 (two) times daily with a meal. For diabetes management 180 tablet 3  . metoprolol tartrate (LOPRESSOR) 100 MG tablet Take 1 tablet (100 mg total) by mouth 2 (two) times daily. For high blood pressure 180 tablet 3  . spironolactone (ALDACTONE) 50 MG tablet Take 1 tablet (50 mg total) by mouth daily. For high blood pressure 90 tablet 3  . traZODone (DESYREL) 100 MG tablet Take 1 tablet (100 mg total) by mouth at bedtime as needed for sleep. For sleep 30 tablet 2  . ziprasidone (GEODON) 60 MG capsule Take 1 capsule (60 mg total) by mouth 2 (two) times daily with a meal. For mood control 60 capsule 0    OB/GYN Status:  No LMP recorded. (Menstrual status: Perimenopausal).  General Assessment Data Location of Assessment: [facility] Assessment Services TTS Assessment: In system Is this a Tele or Face-to-Face Assessment?:  Face-to-Face Is this an Initial Assessment or a Re-assessment for this encounter?: Initial Assessment Patient Accompanied by:: Other(children) Language Other than English: No Living Arrangements: Other (Comment)(with mother) What gender do you identify as?: Female Marital status: Single Maiden name: Sitts Pregnancy  Status: No Living Arrangements: Children, Parent Can pt return to current living arrangement?: Yes Admission Status: Voluntary Is patient capable of signing voluntary admission?: Yes Referral Source: Self/Family/Friend Insurance type: (Medicaid)  Medical Screening Exam ([place]) Medical Exam completed: Yes  Crisis Care Plan Living Arrangements: Children, Parent Legal Guardian: Other:(self) Name of Psychiatrist: [facility] [employer] Name of Therapist: [facility] [employer]  Education Status Is patient currently in school?: No Is the patient employed, unemployed or receiving disability?: Receiving disability income  Risk to self with the past 6 months Suicidal Ideation: No Has patient been a risk to self within the past 6 months prior to admission? : No Suicidal Intent: No Has patient had any suicidal intent within the past 6 months prior to admission? : No Is patient at risk for suicide?: No Suicidal Plan?: No Has patient had any suicidal plan within the past 6 months prior to admission? : No Access to Means: No What has been your use of drugs/alcohol within the last 12 months?: none Previous Attempts/Gestures: No How many times?: (none) Other Self Harm Risks: (none) Triggers for Past Attempts: None known Intentional Self Injurious Behavior: None Family Suicide History: Unknown Recent stressful life event(s): Other (Comment)(none reported) Persecutory voices/beliefs?: Yes Depression: Yes Depression Symptoms: Despondent, Insomnia, Isolating, Loss of interest in usual pleasures, Feeling worthless/self pity Substance abuse history and/or treatment  for substance abuse?: No Suicide prevention information given to non-admitted patients: Not applicable  Risk to Others within the past 6 months Homicidal Ideation: No Does patient have any lifetime risk of violence toward others beyond the six months prior to admission? : No Thoughts of Harm to Others: No Current Homicidal Intent: No Current Homicidal Plan: No Access to Homicidal Means: No Identified Victim: none History of harm to others?: No Assessment of Violence: None Noted Violent Behavior Description: none Does patient have access to weapons?: No Criminal Charges Pending?: No Does patient have a court date: No Is patient on probation?: No  Psychosis Hallucinations: Auditory, Visual Delusions: (hyper-religious)  Mental Status Report Appearance/Hygiene: Unremarkable Eye Contact: Poor Motor Activity: Freedom of movement Speech: Pressured Level of Consciousness: Restless Mood: Depressed, Labile Affect: Labile Anxiety Level: Moderate Thought Processes: Flight of Ideas Judgement: Impaired Orientation: Person, Place, Time Obsessive Compulsive Thoughts/Behaviors: None  Cognitive Functioning Concentration: Decreased Memory: Recent Intact, Remote Intact Is patient IDD: No Insight: Poor Impulse Control: Poor Appetite: Fair Have you had any weight changes? : No Change Sleep: Decreased Total Hours of Sleep: 4 Vegetative Symptoms: None  ADLScreening [facility] Assessment Services) Patient's cognitive ability adequate to safely complete daily activities?: Yes Patient able to express need for assistance with ADLs?: Yes Independently performs ADLs?: Yes (appropriate for developmental age)  Prior Inpatient Therapy Prior Inpatient Therapy: Yes Prior Therapy Dates: (2018) Prior Therapy Facilty/Provider(s): [facility]) Reason for Treatment: bipolar  Prior Outpatient Therapy Prior Outpatient Therapy: Yes Prior Therapy Dates: (active) Prior Therapy Facilty/Provider(s): ([place]) Reason for Treatment: bipolar Does patient have an ACCT team?: No Does patient have Intensive In-House Services?  : No Does patient have [facility] services? : No Does patient have P4CC services?: No  ADL Screening (condition at time of admission) Patient's cognitive ability adequate to safely complete daily activities?: Yes Is the patient deaf or have difficulty hearing?: No Does the patient have difficulty seeing, even when wearing glasses/contacts?: No Does the patient have difficulty concentrating, remembering, or making decisions?: No Patient able to express need for assistance with ADLs?: Yes Does the patient have difficulty dressing or  bathing?: No Independently performs ADLs?: Yes (appropriate for developmental age) Does the patient have difficulty walking or climbing stairs?: No Weakness of Legs: None Weakness of Arms/Hands: None  Home Assistive Devices/Equipment Home Assistive Devices/Equipment: None  Therapy Consults (therapy consults require a physician order) PT Evaluation Needed: No OT Evalulation Needed: No SLP Evaluation Needed: No Abuse/Neglect Assessment (Assessment to be complete while patient is alone) Abuse/Neglect Assessment Can Be Completed: Unable to assess, patient is non-responsive or altered mental status Values / Beliefs Cultural Requests During Hospitalization: None Spiritual Requests During Hospitalization: None Consults Spiritual Care Consult Needed: No Social Work Consult Needed: No Regulatory affairs officer (For Healthcare) Does Patient Have a Medical Advance Directive?: No Would patient like information on creating a medical advance directive?: No - Patient declined Nutrition Screen- [facility] Adult/[facility] Has the patient recently lost weight without trying?: No Has the patient been eating poorly because of a decreased appetite?: No Malnutrition Screening Tool Score: 0        Disposition: Per Marvia Pickles, NP Inpatient Treatment is  recommended Disposition Initial Assessment Completed for this Encounter: Yes Disposition of Patient: Admit Type of inpatient treatment program: Adult  On Site Evaluation by:   Reviewed with Physician:    Judeth Porch Jaslin Novitski 09/13/2018 6:17 PM

## 2018-09-13 NOTE — ED Notes (Signed)
Requested urine from patient. 

## 2018-09-13 NOTE — ED Provider Notes (Signed)
[place] DEPT Provider Note   CSN: [id number] Arrival date & time: 09/13/18  2056  LEVEL 5 CAVEAT - ACUTE PSYCHOSIS  History   Chief Complaint Chief Complaint  Patient presents with  . Psychiatric Evaluation    HPI [name] is a 53 y.o. female.     HPI  53 year old female presents from behavioral health needing screening labs and COVID test.  The patient was brought in by family for worsening psychosis.  She has a history of schizoaffective disorder.  The patient denies any acute complaints and states that she was transiently depressed earlier.  She states she has a chronic cough from smoking but no new cough.  Otherwise the history is fairly limited given her acute psychosis.  Past Medical History:  Diagnosis Date  . Alcohol abuse   . Anemia, iron deficiency   . Arteriosclerotic cardiovascular disease (ASCVD)    Minimal at cath in [facility].stress nuclear study in 8/08 with nl EF; neg stress echo in 2010  . Community acquired pneumonia 01/03/10, 05/2010, 04/2012   2011; with pleural effusion-[facility] acute resp failure; intubated in Jan 2014 (HMPV pneumonia)  . Depression   . Diabetes mellitus, type 2 ([place]) 2000   Onset in 2000; no insulin  . Diarrhea 10/14/2013   Started 10/10/13, improved with Imodium.   . Diastolic dysfunction    grade 2 per echo 2011  . Dysphagia   . Gastroesophageal reflux disease    Schatzki's ring  . History of alcohol abuse 07/22/2007   Qualifier: Diagnosis of  By: Lenn Cal    . Hyperlipidemia   . Hypertension `   during treatment with Geodon  . Hypokalemia 12/25/2013  . Left knee pain 08/25/2014  . Obesity   . Oral candidiasis 05/16/2017  . PTSD (post-traumatic stress disorder)   . Pulmonary hypertension ([place]) 05/02/2012   Patient needs repeat echo in 06/2012   . Schizoaffective disorder    requiring multiple psychiatric admissions  . Viral URI 05/12/2013  . Viral wart on finger 10/08/2016   . Vision changes 08/12/2014    Patient Active Problem List   Diagnosis Date Noted  . Otitis media 06/30/2018  . Schizoaffective disorder, bipolar type ([place]) 08/26/2017  . Immunity status testing 04/17/2017  . Palpitations 12/15/2014  . Encounter for health education 10/24/2014  . Pulsatile tinnitus 08/12/2014  . IIH (idiopathic intracranial hypertension) 08/12/2014  . Seasonal allergies 02/23/2014  . Daytime hypersomnolence 12/09/2012  . COPD (chronic obstructive pulmonary disease) ([place]) 10/20/2012  . GERD (gastroesophageal reflux disease) 10/15/2012  . Preventative health care 10/15/2012  . Pulmonary hypertension ([place]) 05/02/2012  . Diastolic heart failure ([place]) 02/07/2012  . IDA (iron deficiency anemia) 11/06/2011  . Fatigue 05/17/2011  . Arteriosclerotic cardiovascular disease (ASCVD)   . TOBACCO ABUSE 09/27/2009  . Hyperlipidemia 07/22/2007  . Obesity 07/22/2007  . Bipolar 1 disorder ([place]) 07/22/2007  . Resistant hypertension 07/22/2007  . Diabetes mellitus type 2, controlled ([place]) 09/28/2002    Past Surgical History:  Procedure Laterality Date  . COLONOSCOPY  01/2006   internal hemorrhoids  . COLONOSCOPY  01/10/2012   Dr. Rourk:Single anal canal hemorrhoidal tag likely source of  trivial hematochezia; right-sided colonic diverticulosis  . DILATION AND CURETTAGE, DIAGNOSTIC / THERAPEUTIC  1992  . ESOPHAGEAL DILATION N/A 08/18/2015   Procedure: ESOPHAGEAL DILATION;  Surgeon: Daneil Dolin, MD;  [facility];  Service: Endoscopy;  Laterality: N/A;  . ESOPHAGOGASTRODUODENOSCOPY  09/16/08   Dr. Trevor Iha hiatal hernia/excoriations  involving the cardia and mucosa consistent with trauma, antral erosions  of linear petechiae ? gastritis versus early gastric antral vascular  ectasia.Marland Kitchen biopsy showed reactive gastropathy. No H. pylori.  . ESOPHAGOGASTRODUODENOSCOPY  09/2007   Dr. Evalee Mutton ring, dilated to 62 French Maloney dilator, small hiatal hernia, antral  erosions, biopsies reactive gastropathy.  . ESOPHAGOGASTRODUODENOSCOPY (EGD) WITH PROPOFOL N/A 12/17/2013   YJE:[id number] antral erosions and petechiae. Small hiatal hernia. No endoscopic explanation for patient's symptoms  . ESOPHAGOGASTRODUODENOSCOPY (EGD) WITH PROPOFOL N/A 08/18/2015   Procedure: ESOPHAGOGASTRODUODENOSCOPY (EGD) WITH PROPOFOL;  Surgeon: Daneil Dolin, MD;  Location: [facility];  Service: Endoscopy;  Laterality: N/A;  0845  . SAVORY DILATION  07/17/2011   Fields-MAC sedation-->distal esophageal stricture s/p dilation, chronic gastritis, multiple ulcers in stomach. no h.pylori     OB History    Gravida  3   Para  2   Term  2   Preterm      AB  1   Living  2     SAB      TAB  1   Ectopic      Multiple      Live Births  2            Home Medications    Prior to Admission medications   Medication Sig Start Date End Date Taking? Authorizing Provider  Accu-Chek FastClix Lancets MISC Use to check you blood sugar 3 times daily 07/29/18  Yes Helberg, Larkin Ina, MD  albuterol (VENTOLIN HFA) 108 (90 Base) MCG/ACT inhaler Inhale 1-2 puffs into the lungs every 6 (six) hours as needed for wheezing or shortness of breath. 09/24/17  Yes Axel Filler, MD  amLODipine (NORVASC) 10 MG tablet Take 1 tablet (10 mg total) by mouth daily. For high blood pressure 09/24/17 09/24/18 Yes Axel Filler, MD  Blood Glucose Monitoring Suppl (ACCU-CHEK GUIDE) w/Device KIT 1 Device by Does not apply route 3 (three) times daily. Use to check blood sugars 3 times daily. 07/29/18  Yes Helberg, Larkin Ina, MD  esomeprazole (NEXIUM) 40 MG capsule TAKE 1 CAPSULE(40 MG) BY MOUTH TWICE DAILY BEFORE A MEAL FOR ACID REFLUX 08/06/18  Yes Helberg, Larkin Ina, MD  ferrous sulfate 325 (65 FE) MG tablet Take 1 tablet (325 mg total) by mouth every other day. (May buy from over the counter): For anemia 09/24/17  Yes Axel Filler, MD  glucose blood (ACCU-CHEK GUIDE) test strip Use as  instructed 07/29/18  Yes Helberg, Larkin Ina, MD  Insulin Glargine (LANTUS) 100 UNIT/ML Solostar Pen Inject 21 Units into the skin daily. 06/12/18  Yes Helberg, Larkin Ina, MD  Insulin Pen Needle (PEN NEEDLES) 31G X 5 MM MISC Inject 18 Units into the skin at bedtime. Use to inject insulin daily at bedtime: For diabetes management: 06/12/18  Yes Helberg, Larkin Ina, MD  loratadine (CLARITIN) 10 MG tablet Take 1 tablet (10 mg total) by mouth daily. (May buy from over the counter): For allergies 07/25/18  Yes Neva Seat, MD  losartan (COZAAR) 100 MG tablet Take 1 tablet (100 mg total) by mouth daily. For high blood pressure 09/04/18  Yes Helberg, Larkin Ina, MD  lovastatin (MEVACOR) 40 MG tablet Take 1 tablet (40 mg total) by mouth daily. 09/04/18 12/03/18 Yes Helberg, Larkin Ina, MD  metFORMIN (GLUCOPHAGE) 1000 MG tablet Take 1 tablet (1,000 mg total) by mouth 2 (two) times daily with a meal. For diabetes management 09/04/18  Yes Helberg, Larkin Ina, MD  metoprolol tartrate (LOPRESSOR) 100 MG tablet Take 1 tablet (100 mg total)  by mouth 2 (two) times daily. For high blood pressure 09/04/18  Yes Helberg, Larkin Ina, MD  spironolactone (ALDACTONE) 50 MG tablet Take 1 tablet (50 mg total) by mouth daily. For high blood pressure 09/04/18  Yes Helberg, Larkin Ina, MD  traZODone (DESYREL) 100 MG tablet Take 1 tablet (100 mg total) by mouth at bedtime as needed for sleep. For sleep 09/24/17  Yes Axel Filler, MD  ziprasidone (GEODON) 60 MG capsule Take 1 capsule (60 mg total) by mouth 2 (two) times daily with a meal. For mood control 08/28/17  Yes Encarnacion Slates, NP    Family History Family History  Problem Relation Age of Onset  . Hypertension Mother   . Stroke Father        deceased at age 32  . Colon cancer Other   . Heart disease Sister   . Diabetes Other   . High Cholesterol Other   . Arthritis Other   . Anesthesia problems Neg Hx   . Hypotension Neg Hx   . Malignant hyperthermia Neg Hx   . Pseudochol deficiency Neg Hx      Social History Social History   Tobacco Use  . Smoking status: Current Some Day Smoker    Packs/day: 0.75    Years: 30.00    Pack years: 22.50    Types: Cigarettes    Start date: 05/18/2012    Last attempt to quit: 03/28/2017    Years since quitting: 1.4  . Smokeless tobacco: Never Used  . Tobacco comment: Cutting back   Substance Use Topics  . Alcohol use: No    Alcohol/week: 0.0 standard drinks    Comment: hx of ETOH abuse  . Drug use: No     Allergies   Cephalexin; Metronidazole; Orange; Orange oil; Other; Shrimp [shellfish allergy]; Penicillins; Sulfonamide derivatives; Glipizide; Ace inhibitors; Sulfamethoxazole; Sulfamethoxazole-trimethoprim; and Trimethoprim   Review of Systems Review of Systems  Unable to perform ROS: Psychiatric disorder     Physical Exam Updated Vital Signs BP (!) 169/135 (BP Location: Left Arm)   Pulse 90   Temp 98.3 F (36.8 C)   Resp 18   SpO2 100%   Physical Exam Vitals signs and nursing note reviewed.  Constitutional:      Appearance: She is well-developed.  HENT:     Head: Normocephalic and atraumatic.     Right Ear: External ear normal.     Left Ear: External ear normal.     Nose: Nose normal.  Eyes:     General:        Right eye: No discharge.        Left eye: No discharge.  Cardiovascular:     Rate and Rhythm: Normal rate and regular rhythm.     Heart sounds: Normal heart sounds.  Pulmonary:     Effort: Pulmonary effort is normal.     Breath sounds: Normal breath sounds.  Abdominal:     General: There is no distension.  Skin:    General: Skin is warm and dry.  Neurological:     Mental Status: She is alert.  Psychiatric:        Mood and Affect: Mood is not anxious.        Speech: Speech is tangential.        Behavior: Behavior is not agitated.        Thought Content: Thought content does not include suicidal ideation.     Comments: Patient has rambling speech and appears restless  ED Treatments /  Results  Labs (all labs ordered are listed, but only abnormal results are displayed) Labs Reviewed  GLUCOSE, CAPILLARY - Abnormal; Notable for the following components:      Result Value   Glucose-Capillary 291 (*)    All other components within normal limits  COMPREHENSIVE METABOLIC PANEL - Abnormal; Notable for the following components:   Potassium 3.3 (*)    Glucose, Bld 294 (*)    AST 13 (*)    Total Bilirubin 0.2 (*)    All other components within normal limits  ACETAMINOPHEN LEVEL - Abnormal; Notable for the following components:   Acetaminophen (Tylenol), Serum <10 (*)    All other components within normal limits  CBC WITH DIFFERENTIAL/PLATELET - Abnormal; Notable for the following components:   RBC 5.44 (*)    Hemoglobin 15.6 (*)    HCT 48.2 (*)    RDW 15.7 (*)    All other components within normal limits  SARS CORONAVIRUS 2 (HOSPITAL ORDER, [place] [facility])  SALICYLATE LEVEL  URINALYSIS, ROUTINE W REFLEX MICROSCOPIC  I-STAT BETA HCG BLOOD, ED ([facility], [facility], [facility] ONLY)    EKG None  Radiology No results found.  Procedures Procedures (including critical care time)  Medications Ordered in ED Medications  potassium chloride SA (K-DUR) CR tablet 40 mEq (has no administration in time range)     Initial Impression / Assessment and Plan / ED Course  I have reviewed the triage vital signs and the nursing notes.  Pertinent labs & imaging results that were available during my care of the patient were reviewed by me and considered in my medical decision making (see chart for details).        Patient screening lab work shows mild hypokalemia and hyperglycemia.  No acidosis.  Her COVID test is negative.  Otherwise, this appears to be a psychiatric issue and she will be sent back to behavioral health. UA pending.  Final Clinical Impressions(s) / ED Diagnoses   Final diagnoses:  Acute psychosis ([place])  Hypokalemia    ED Discharge Orders    None        Sherwood Gambler, MD 09/13/18 2251

## 2018-09-14 ENCOUNTER — Inpatient Hospital Stay (HOSPITAL_COMMUNITY): Admission: AD | Admit: 2018-09-14 | Payer: Medicare Other | Source: Intra-hospital | Admitting: Psychiatry

## 2018-09-14 ENCOUNTER — Encounter (HOSPITAL_COMMUNITY): Payer: Self-pay

## 2018-09-14 ENCOUNTER — Other Ambulatory Visit: Payer: Self-pay

## 2018-09-14 DIAGNOSIS — F3110 Bipolar disorder, current episode manic without psychotic features, unspecified: Secondary | ICD-10-CM | POA: Diagnosis present

## 2018-09-14 LAB — CBC
HCT: 45.1 % (ref 36.0–46.0)
Hemoglobin: 14.7 g/dL (ref 12.0–15.0)
MCH: 28.3 pg (ref 26.0–34.0)
MCHC: 32.6 g/dL (ref 30.0–36.0)
MCV: 86.7 fL (ref 80.0–100.0)
Platelets: 233 10*3/uL (ref 150–400)
RBC: 5.2 MIL/uL — ABNORMAL HIGH (ref 3.87–5.11)
RDW: 15.3 % (ref 11.5–15.5)
WBC: 7.1 10*3/uL (ref 4.0–10.5)
nRBC: 0 % (ref 0.0–0.2)

## 2018-09-14 LAB — COMPREHENSIVE METABOLIC PANEL
ALT: 11 U/L (ref 0–44)
AST: 12 U/L — ABNORMAL LOW (ref 15–41)
Albumin: 4.1 g/dL (ref 3.5–5.0)
Alkaline Phosphatase: 94 U/L (ref 38–126)
Anion gap: 10 (ref 5–15)
BUN: 8 mg/dL (ref 6–20)
CO2: 24 mmol/L (ref 22–32)
Calcium: 9.4 mg/dL (ref 8.9–10.3)
Chloride: 104 mmol/L (ref 98–111)
Creatinine, Ser: 0.74 mg/dL (ref 0.44–1.00)
GFR calc Af Amer: >60 mL/min (ref >60)
GFR calc non Af Amer: >60 mL/min (ref >60)
Glucose, Bld: 203 mg/dL — ABNORMAL HIGH (ref 70–99)
Potassium: 3.5 mmol/L (ref 3.5–5.1)
Sodium: 138 mmol/L (ref 135–145)
Total Bilirubin: 0.4 mg/dL (ref 0.3–1.2)
Total Protein: 7.4 g/dL (ref 6.5–8.1)

## 2018-09-14 LAB — GLUCOSE, CAPILLARY
Glucose-Capillary: 236 mg/dL — ABNORMAL HIGH (ref 70–99)
Glucose-Capillary: 327 mg/dL — ABNORMAL HIGH (ref 70–99)
Glucose-Capillary: 209 mg/dL — ABNORMAL HIGH (ref 70–99)

## 2018-09-14 LAB — LIPID PANEL
Cholesterol: 165 mg/dL (ref 0–200)
HDL: 30 mg/dL — ABNORMAL LOW (ref >40)
LDL Cholesterol: 105 mg/dL — ABNORMAL HIGH (ref 0–99)
Total CHOL/HDL Ratio: 5.5 RATIO
Triglycerides: 148 mg/dL (ref <150)
VLDL: 30 mg/dL (ref 0–40)

## 2018-09-14 LAB — TSH: TSH: 1.784 u[IU]/mL (ref 0.350–4.500)

## 2018-09-14 MED ORDER — SPIRONOLACTONE 50 MG PO TABS
50.0000 mg | ORAL_TABLET | Freq: Every day | ORAL | Status: DC
  Administered 2018-09-14 – 2018-09-17 (×4): 50 mg via ORAL
  Filled 2018-09-14 (×4): qty 1
  Filled 2018-09-14: qty 2
  Filled 2018-09-14 (×2): qty 1

## 2018-09-14 MED ORDER — METFORMIN HCL 500 MG PO TABS
1000.0000 mg | ORAL_TABLET | Freq: Two times a day (BID) | ORAL | Status: DC
  Administered 2018-09-14 – 2018-09-17 (×7): 1000 mg via ORAL
  Filled 2018-09-14 (×12): qty 2

## 2018-09-14 MED ORDER — AMLODIPINE BESYLATE 10 MG PO TABS
10.0000 mg | ORAL_TABLET | Freq: Every day | ORAL | Status: DC
  Administered 2018-09-14 – 2018-09-17 (×4): 10 mg via ORAL
  Filled 2018-09-14: qty 1
  Filled 2018-09-14: qty 2
  Filled 2018-09-14 (×5): qty 1

## 2018-09-14 MED ORDER — ACETAMINOPHEN 325 MG PO TABS: 650.0000 mg | ORAL_TABLET | Freq: Four times a day (QID) | ORAL | Status: DC | PRN

## 2018-09-14 MED ORDER — METOPROLOL TARTRATE 100 MG PO TABS
100.0000 mg | ORAL_TABLET | Freq: Two times a day (BID) | ORAL | Status: DC
  Administered 2018-09-14 – 2018-09-17 (×7): 100 mg via ORAL
  Filled 2018-09-14 (×8): qty 1
  Filled 2018-09-14: qty 4
  Filled 2018-09-14 (×3): qty 1

## 2018-09-14 MED ORDER — INSULIN ASPART 100 UNIT/ML ~~LOC~~ SOLN
0.0000 [IU] | Freq: Three times a day (TID) | SUBCUTANEOUS | Status: DC
  Administered 2018-09-14: 17:00:00 7 [IU] via SUBCUTANEOUS
  Administered 2018-09-15: 5 [IU] via SUBCUTANEOUS
  Administered 2018-09-15: 2 [IU] via SUBCUTANEOUS
  Administered 2018-09-15: 3 [IU] via SUBCUTANEOUS
  Administered 2018-09-16 (×2): 2 [IU] via SUBCUTANEOUS
  Administered 2018-09-17: 1 [IU] via SUBCUTANEOUS

## 2018-09-14 MED ORDER — MAGNESIUM HYDROXIDE 400 MG/5ML PO SUSP: 30.0000 mL | Freq: Every day | ORAL | Status: DC | PRN

## 2018-09-14 MED ORDER — INSULIN GLARGINE 100 UNIT/ML ~~LOC~~ SOLN
18.0000 [IU] | Freq: Every day | SUBCUTANEOUS | Status: DC
  Administered 2018-09-14 – 2018-09-16 (×3): 18 [IU] via SUBCUTANEOUS

## 2018-09-14 MED ORDER — HYDROXYZINE HCL 25 MG PO TABS
25.0000 mg | ORAL_TABLET | Freq: Three times a day (TID) | ORAL | Status: DC | PRN
  Administered 2018-09-14 – 2018-09-16 (×2): 25 mg via ORAL
  Filled 2018-09-14: qty 1

## 2018-09-14 MED ORDER — PRAVASTATIN SODIUM 40 MG PO TABS
40.0000 mg | ORAL_TABLET | Freq: Every day | ORAL | Status: DC
  Administered 2018-09-14 – 2018-09-16 (×3): 40 mg via ORAL
  Filled 2018-09-14 (×5): qty 1

## 2018-09-14 MED ORDER — ALUM & MAG HYDROXIDE-SIMETH 200-200-20 MG/5ML PO SUSP: 30.0000 mL | ORAL | Status: DC | PRN

## 2018-09-14 MED ORDER — LOSARTAN POTASSIUM 50 MG PO TABS
100.0000 mg | ORAL_TABLET | Freq: Every day | ORAL | Status: DC
  Administered 2018-09-14 – 2018-09-17 (×4): 100 mg via ORAL
  Filled 2018-09-14 (×8): qty 2

## 2018-09-14 MED ORDER — TRAZODONE HCL 50 MG PO TABS
50.0000 mg | ORAL_TABLET | Freq: Every evening | ORAL | Status: DC | PRN
  Administered 2018-09-14 – 2018-09-16 (×3): 50 mg via ORAL
  Filled 2018-09-14 (×4): qty 1

## 2018-09-14 MED ORDER — INSULIN GLARGINE 100 UNIT/ML ~~LOC~~ SOLN: 18.0000 [IU] | Freq: Every day | SUBCUTANEOUS | Status: DC

## 2018-09-14 MED ORDER — ZIPRASIDONE HCL 60 MG PO CAPS
60.0000 mg | ORAL_CAPSULE | Freq: Two times a day (BID) | ORAL | Status: DC
  Administered 2018-09-14 – 2018-09-15 (×3): 60 mg via ORAL
  Filled 2018-09-14 (×7): qty 1
  Filled 2018-09-14: qty 3

## 2018-09-14 NOTE — [facility] Group Notes (Signed)
[facility] Group Notes: (Clinical Social Work)   09/14/2018      Type of Therapy:  Group Therapy   Participation Level:  Did Not Attend - was invited both individually by [facility] and by overhead announcement, chose not to attend.   Selmer Dominion, LCSW 09/14/2018, 12:54 PM

## 2018-09-14 NOTE — Progress Notes (Signed)
Pt yelling out talking loudly in her sleep waking her roommate up. Pt had to be placed in the quiet room. Pt heard talking to people not seen by staff.

## 2018-09-14 NOTE — [facility] Suicide Risk Assessment (Addendum)
[facility] Admission Suicide Risk Assessment   Nursing information obtained from:  Patient Demographic factors:  NA Current Mental Status:  NA Loss Factors:  NA Historical Factors:  NA Risk Reduction Factors:  NA  Total Time spent with patient: 45 minutes Principal Problem: Bipolar I disorder, most recent episode (or current) manic ([place]) Diagnosis:  Principal Problem:   Bipolar I disorder, most recent episode (or current) manic ([place])  Subjective Data:   Continued Clinical Symptoms:  Alcohol Use Disorder Identification Test Final Score (AUDIT): 0 The "Alcohol Use Disorders Identification Test", Guidelines for Use in Primary Care, Second Edition.  World Pharmacologist [facility]). Score between 0-7:  no or low risk or alcohol related problems. Score between 8-15:  moderate risk of alcohol related problems. Score between 16-19:  high risk of alcohol related problems. Score 20 or above:  warrants further diagnostic evaluation for alcohol dependence and treatment.   CLINICAL FACTORS:  53 year old female , presented to hospital with family members, who expressed concern regarding increasingly bizarre behavior, emotional lability, religious preoccupations,delusions ( making statements that she was God's wife ), not sleeping. Report is that she has been off her medications for about two weeks. Patient presents as fair historian at this time. Presents calm but vaguely irritable. States " I've been tired, that's all".  Currently denies depression, neuro-vegetative symptoms or suicidal ideations.  Patient has history of psychiatric illness, and has been diagnosed with Schizoaffective Disorder in the past . She was admitted to [facility] in May of 2019 for similar presentation. At the time was discharged on Geodon and Trazodone. Denies alcohol or drug abuse 5/30 UDS negative. 5/30 CBG 291, 5/31 236. BP today 150/98 Medical History - DM, HTN Dx- Schizoaffective Disorder Plan- inpatient admission. Resume  Geodon ( reports she has been off for several days, denies having had side effects) Check EKG to monitor QTc Resumed  home medications- ( Insulin Lantus/ Metformin/ Novolog sliding scale)  for DM and Lopressor, Losartan, Amlodipine, Aldactone for HTN. Have reviewed with hospitalist  consultant  Musculoskeletal: Strength & Muscle Tone: within normal limits Gait & Station: normal Patient leans: N/A  Psychiatric Specialty Exam: Physical Exam  ROS denies headache, no chest pain, no shortness of breath, no coughing, no vomiting , no fever or chills   Blood pressure (!) 150/98, pulse 85, temperature 97.8 F (36.6 C), temperature source Oral, resp. rate 18, SpO2 100 %.There is no height or weight on file to calculate BMI.  General Appearance: Fairly Groomed  Eye Contact:  Fair  Speech:  decreased   Volume:  Decreased  Mood:  reports " my mood is fine"  Affect:  blunted and mildly irritable   Thought Process:  Linear and Descriptions of Associations: Circumstantial  Orientation:  Other:  alert and attentive, oriented to [facility] [place], and to [employer] Content:  currently denies hallucinations, and does not appear internally preoccupied at this time  Suicidal Thoughts:  No denies suicidal or self injurious ideations, denies homicidal or violent ideations  Homicidal Thoughts:  No  Memory:  recent and remote fair   Judgement:  Fair  Insight:  Fair  Psychomotor Activity:  Decreased- no current psychomotor agitation or restlessness   Concentration:  Concentration: Fair and Attention Span: Fair  Recall:  [employer] of Knowledge:  Fair  Language:  Fair  Akathisia:  Negative  Handed:  Right  AIMS (if indicated):     Assets:  Financial Resources/Insurance Resilience  ADL's:  Intact  Cognition:  WNL  Sleep:  Number of Hours: 1.25      COGNITIVE FEATURES THAT CONTRIBUTE TO RISK:  Closed-mindedness and Loss of executive function    SUICIDE RISK:   Moderate:  Frequent suicidal  ideation with limited intensity, and duration, some specificity in terms of plans, no associated intent, good self-control, limited dysphoria/symptomatology, some risk factors present, and identifiable protective factors, including available and accessible social support.  PLAN OF CARE: Patient will be admitted to inpatient psychiatric unit for stabilization and safety. Will provide and encourage milieu participation. Provide medication management and maked adjustments as needed.  Will follow daily.    I certify that inpatient services furnished can reasonably be expected to improve the patient's condition.   Jenne Campus, MD 09/14/2018, 12:55 PM

## 2018-09-14 NOTE — ED Notes (Signed)
Patient taken to [facility] by [facility] and tech.

## 2018-09-14 NOTE — Progress Notes (Signed)
[facility] NOVEL CORONAVIRUS (COVID-19) DAILY CHECK-OFF SYMPTOMS - answer yes or no to each - every day NO YES  Have you had a fever in the past 24 hours?  . Fever (Temp > 37.80C / 100F) X   Have you had any of these symptoms in the past 24 hours? . New Cough .  Sore Throat  .  Shortness of Breath .  Difficulty Breathing .  Unexplained Body Aches   X   Have you had any one of these symptoms in the past 24 hours not related to allergies?   . Runny Nose .  Nasal Congestion .  Sneezing   X   If you have had runny nose, nasal congestion, sneezing in the past 24 hours, has it worsened?  X   EXPOSURES - check yes or no X   Have you traveled outside the state in the past 14 days?  X   Have you been in contact with someone with a confirmed diagnosis of COVID-19 or PUI in the past 14 days without wearing appropriate PPE?  X   Have you been living in the same home as a person with confirmed diagnosis of COVID-19 or a PUI (household contact)?    X   Have you been diagnosed with COVID-19?    X              What to do next: Answered NO to all: Answered YES to anything:   Proceed with unit schedule Follow the [facility] Inpatient Flowsheet.   

## 2018-09-14 NOTE — H&P (Addendum)
Psychiatric Admission Assessment Adult  Patient Identification: [name] MRN:  [id number] Date of Evaluation:  09/14/2018 Chief Complaint:  Hypokalemia [E87.6] Acute psychosis ([place]) [F23] Principal Diagnosis: Bipolar I disorder, most recent episode (or current) manic ([place]) Diagnosis:  Principal Problem:   Bipolar I disorder, most recent episode (or current) manic ([place])  History of Present Illness: Per assessment note:[name] is an 53 y.o. female who presented to [facility] as a walk-in with her family seeking admission to [facility]. Patient states, "I am really tired. I miss my husband.  I don't know where he is and I don't know how long he has been gone.  I cannot take another day without him."  When asked if she was suicidal, patient states, "I could not kill myself if I tried.  If I die, all of creation dies.  If I could just put myself to sleep.  I love my children, but I have been cut off from them."  Patient states, "My husband is teaching my children to disrespect him, I follow in [place] war."  According to patient's daughter [name] [phone], Patient is normally seen at [facility] and her medications are managed by them.  She states that patient has been off her medication for 2 weeks and her behavior has become increasingly bizarre.  Daughter states that patient has never been married.  Patient thinks that she is God's wife and that all the people in the world are her children.  Daughter states that patient can be very religious at times and then cussing everyone out five minutes later.  She states that patient has not been sleeping  Daughter states that patient becomes very impulsive when she is like this and they cannot manage her like she is.    Patient has never been suicidal in the past, but has tried to jump out of a moving car when she was in a manic state.  Patient has never done anything to try to harm others.  She was last inpatient behavioral  health at [facility] 2 years ago and was seen at [facility] and observed for two days and medications restarted last year.   Patient has no history of any drug use and states that she does not drink alcohol.  Patient has only been sleeping on average of four hours per night if that and she has not been eating as much as usual.  Patient denies having a history of self-mutilation and abuse. Patient denies having any history of legal involvement.  Patient was alert and oriented x 3.  Her thoughts were loose and disorganized, but her memory appeared to be intact.  She presented as being delusional and it is suspected that she is responding to bother visual and auditory hallucinations.  Family states that patient is laughing inappropriately.  Her judgment, insight and impulse control are impaired.  Her psycho-motor activity is restless.  Her speech is clear and coherent, but her eye contact is poor.  Evaluation: Vyla observed sitting in day room interacting with peers.  Patient was requesting a discharge.  States she is unsure how she got to the hospital however is feeling better.  States she is followed by [facility] however has not been to [facility] in the past 4 months.  Stated that she is prescribed Geodon.  Reports taking as prescribed.  Patient stated she is unsure of her current diagnoses.  She denies drugs or alcohol.  Denied a family history of mental illness.  Reported previous  inpatient admissions however states more than 2 years prior.  Chart reviewed patient was admitted for being delusional, impulsive and bizarre behavior.  UDS is negative.  Per chart patient has not been resting well at night.  Attending psychiatrist to follow-up with admission assessment.  Support, encouragement  and reassurance was provided.  Associated Signs/Symptoms: Depression Symptoms:  depressed mood, feelings of worthlessness/guilt, difficulty concentrating, (Hypo) Manic Symptoms:   Distractibility, Hallucinations, Impulsivity, Irritable Mood, Labiality of Mood, Anxiety Symptoms:  Excessive Worry, Psychotic Symptoms:  Hallucinations: Auditory Paranoia, PTSD Symptoms: NA Total Time spent with patient: 15 minutes  Past Psychiatric History:  Patient reported previous inpatient admissions, stated that is she is followed by [facility] and hasn't been in the past 6 month.   Is the patient at risk to self? Yes.    Has the patient been a risk to self in the past 6 months? Yes.    Has the patient been a risk to self within the distant past? No.  Is the patient a risk to others? No.  Has the patient been a risk to others in the past 6 months? No.  Has the patient been a risk to others within the distant past? No.   Prior Inpatient Therapy: Prior Inpatient Therapy: Yes Prior Therapy Dates: (2018) Prior Therapy Facilty/Provider(s): [facility]) Reason for Treatment: bipolar Prior Outpatient Therapy: Prior Outpatient Therapy: Yes Prior Therapy Dates: (active) Prior Therapy Facilty/Provider(s): ([facility]) Reason for Treatment: bipolar Does patient have an ACCT team?: No Does patient have Intensive In-House Services?  : No Does patient have [facility] services? : No Does patient have P4CC services?: No  Alcohol Screening: 1. How often do you have a drink containing alcohol?: Never 2. How many drinks containing alcohol do you have on a typical day when you are drinking?: 1 or 2 3. How often do you have six or more drinks on one occasion?: Never AUDIT-C Score: 0 4. How often during the last year have you found that you were not able to stop drinking once you had started?: Never 5. How often during the last year have you failed to do what was normally expected from you becasue of drinking?: Never 6. How often during the last year have you needed a first drink in the morning to get yourself going after a heavy drinking session?: Never 7. How often during the last year  have you had a feeling of guilt of remorse after drinking?: Never 8. How often during the last year have you been unable to remember what happened the night before because you had been drinking?: Never 9. Have you or someone else been injured as a result of your drinking?: No 10. Has a relative or friend or a doctor or another health worker been concerned about your drinking or suggested you cut down?: No Alcohol Use Disorder Identification Test Final Score (AUDIT): 0 Substance Abuse History in the last 12 months:  No. Consequences of Substance Abuse: NA Previous Psychotropic Medications: Yes  Psychological Evaluations: No  Past Medical History:  Past Medical History:  Diagnosis Date  . Alcohol abuse   . Anemia, iron deficiency   . Arteriosclerotic cardiovascular disease (ASCVD)    Minimal at cath in [facility].stress nuclear study in 8/08 with nl EF; neg stress echo in 2010  . Community acquired pneumonia 01/03/10, 05/2010, 04/2012   2011; with pleural effusion-hosp [facility] acute resp failure; intubated in Jan 2014 (HMPV pneumonia)  . Depression   . Diabetes mellitus, type 2 ([place])  2000   Onset in 2000; no insulin  . Diarrhea 10/14/2013   Started 10/10/13, improved with Imodium.   . Diastolic dysfunction    grade 2 per echo 2011  . Dysphagia   . Gastroesophageal reflux disease    Schatzki's ring  . History of alcohol abuse 07/22/2007   Qualifier: Diagnosis of  By: Lenn Cal    . Hyperlipidemia   . Hypertension `   during treatment with Geodon  . Hypokalemia 12/25/2013  . Left knee pain 08/25/2014  . Obesity   . Oral candidiasis 05/16/2017  . PTSD (post-traumatic stress disorder)   . Pulmonary hypertension ([place]) 05/02/2012   Patient needs repeat echo in 06/2012   . Schizoaffective disorder    requiring multiple psychiatric admissions  . Viral URI 05/12/2013  . Viral wart on finger 10/08/2016  . Vision changes 08/12/2014    Past Surgical History:  Procedure Laterality Date   . COLONOSCOPY  01/2006   internal hemorrhoids  . COLONOSCOPY  01/10/2012   Dr. Rourk:Single anal canal hemorrhoidal tag likely source of  trivial hematochezia; right-sided colonic diverticulosis  . DILATION AND CURETTAGE, DIAGNOSTIC / THERAPEUTIC  1992  . ESOPHAGEAL DILATION N/A 08/18/2015   Procedure: ESOPHAGEAL DILATION;  Surgeon: Daneil Dolin, MD;  [facility];  Service: Endoscopy;  Laterality: N/A;  . ESOPHAGOGASTRODUODENOSCOPY  09/16/08   Dr. Trevor Iha hiatal hernia/excoriations involving the cardia and mucosa consistent with trauma, antral erosions  of linear petechiae ? gastritis versus early gastric antral vascular  ectasia.Marland Kitchen biopsy showed reactive gastropathy. No H. pylori.  . ESOPHAGOGASTRODUODENOSCOPY  09/2007   Dr. Evalee Mutton ring, dilated to 71 French Maloney dilator, small hiatal hernia, antral erosions, biopsies reactive gastropathy.  . ESOPHAGOGASTRODUODENOSCOPY (EGD) WITH PROPOFOL N/A 12/17/2013   HUD:JSHFWYO antral erosions and petechiae. Small hiatal hernia. No endoscopic explanation for patient's symptoms  . ESOPHAGOGASTRODUODENOSCOPY (EGD) WITH PROPOFOL N/A 08/18/2015   Procedure: ESOPHAGOGASTRODUODENOSCOPY (EGD) WITH PROPOFOL;  Surgeon: Daneil Dolin, MD;  [facility];  Service: Endoscopy;  Laterality: N/A;  0845  . SAVORY DILATION  07/17/2011   Fields-MAC sedation-->distal esophageal stricture s/p dilation, chronic gastritis, multiple ulcers in stomach. no h.pylori   Family History:  Family History  Problem Relation Age of Onset  . Hypertension Mother   . Stroke Father        deceased at age 56  . Colon cancer Other   . Heart disease Sister   . Diabetes Other   . High Cholesterol Other   . Arthritis Other   . Anesthesia problems Neg Hx   . Hypotension Neg Hx   . Malignant hyperthermia Neg Hx   . Pseudochol deficiency Neg Hx    Family Psychiatric  History:  Tobacco Screening: Have you used any form of tobacco in the last 30 days?  (Cigarettes, Smokeless Tobacco, Cigars, and/or Pipes): Yes Tobacco use, Select all that apply: 4 or less cigarettes per day Are you interested in Tobacco Cessation Medications?: No, patient refused Counseled patient on smoking cessation including recognizing danger situations, developing coping skills and basic information about quitting provided: Refused/Declined practical counseling Social History:  Social History   Substance and Sexual Activity  Alcohol Use No  . Alcohol/week: 0.0 standard drinks   Comment: hx of ETOH abuse     Social History   Substance and Sexual Activity  Drug Use No    Additional Social History: Marital status: Single    Pain Medications: See MAR Prescriptions: See MAR History of alcohol / drug  use?: No history of alcohol / drug abuse Longest period of sobriety (when/how long): N/A                    Allergies:   Allergies  Allergen Reactions  . Cephalexin     Trouble breathing, felt like she had bumps in her throat.  . Metronidazole Shortness Of Breath and Swelling  . Orange Itching  . Orange Oil   . Other Itching and Shortness Of Breath    Takes Benadryl before eating shrimp.  . Shrimp [Shellfish Allergy] Shortness Of Breath and Itching    Takes Benadryl before eating shrimp.  Marland Kitchen Penicillins Hives and Swelling    Has patient had a PCN reaction causing immediate rash, facial/tongue/throat swelling, SOB or lightheadedness with hypotension: Yes Has patient had a PCN reaction causing severe rash involving mucus membranes or skin necrosis: No Has patient had a PCN reaction that required hospitalization No Has patient had a PCN reaction occurring within the last 10 years: Yes If all of the above answers are "NO", then may proceed with Cephalosporin use.    Fever as well  . Sulfonamide Derivatives Hives    fever  . Glipizide Other (See Comments)    psychosis  . Ace Inhibitors Cough    06/2016  . Sulfamethoxazole   .  Sulfamethoxazole-Trimethoprim Rash  . Trimethoprim    Lab Results:  Results for orders placed or performed during the hospital encounter of 09/13/18 (from the past 48 hour(s))  SARS Coronavirus 2 (CEPHEID - Performed in [facility] hospital lab), Hosp Order     Status: None   Collection Time: 09/13/18  6:25 PM  Result Value Ref Range   SARS Coronavirus 2 NEGATIVE NEGATIVE    Comment: (NOTE) If result is NEGATIVE SARS-CoV-2 target nucleic acids are NOT DETECTED. The SARS-CoV-2 RNA is generally detectable in upper and lower  respiratory specimens during the acute phase of infection. The lowest  concentration of SARS-CoV-2 viral copies this assay can detect is 250  copies / mL. A negative result does not preclude SARS-CoV-2 infection  and should not be used as the sole basis for treatment or other  patient management decisions.  A negative result may occur with  improper specimen collection / handling, submission of specimen other  than nasopharyngeal swab, presence of viral mutation(s) within the  areas targeted by this assay, and inadequate number of viral copies  (<250 copies / mL). A negative result must be combined with clinical  observations, patient history, and epidemiological information. If result is POSITIVE SARS-CoV-2 target nucleic acids are DETECTED. The SARS-CoV-2 RNA is generally detectable in upper and lower  respiratory specimens dur ing the acute phase of infection.  Positive  results are indicative of active infection with SARS-CoV-2.  Clinical  correlation with patient history and other diagnostic information is  necessary to determine patient infection status.  Positive results do  not rule out bacterial infection or co-infection with other viruses. If result is PRESUMPTIVE POSTIVE SARS-CoV-2 nucleic acids MAY BE PRESENT.   A presumptive positive result was obtained on the submitted specimen  and confirmed on repeat testing.  While 2019 novel coronavirus   (SARS-CoV-2) nucleic acids may be present in the submitted sample  additional confirmatory testing may be necessary for epidemiological  and / or clinical management purposes  to differentiate between  SARS-CoV-2 and other Sarbecovirus currently known to infect humans.  If clinically indicated additional testing with an alternate test  methodology [phone]) is  advised. The SARS-CoV-2 RNA is generally  detectable in upper and lower respiratory sp ecimens during the acute  phase of infection. The expected result is Negative. Fact Sheet for Patients:  StrictlyIdeas.no Fact Sheet for Healthcare Providers: BankingDealers.co.za This test is not yet approved or cleared by the [place] FDA and has been authorized for detection and/or diagnosis of SARS-CoV-2 by FDA under an Emergency Use Authorization (EUA).  This EUA will remain in effect (meaning this test can be used) for the duration of the COVID-19 declaration under Section 564(b)(1) of the Act, 21 U.S.C. section 360bbb-3(b)(1), unless the authorization is terminated or revoked sooner. Performed at [facility], [place] [address] [place], [place] [address]   Glucose, capillary     Status: Abnormal   Collection Time: 09/13/18  6:44 PM  Result Value Ref Range   Glucose-Capillary 291 (H) 70 - 99 mg/dL   Comment 1 Notify RN   Comprehensive metabolic panel     Status: Abnormal   Collection Time: 09/13/18  9:11 PM  Result Value Ref Range   Sodium 137 135 - 145 mmol/L   Potassium 3.3 (L) 3.5 - 5.1 mmol/L   Chloride 102 98 - 111 mmol/L   CO2 25 22 - 32 mmol/L   Glucose, Bld 294 (H) 70 - 99 mg/dL   BUN 10 6 - 20 mg/dL   Creatinine, Ser 0.89 0.44 - 1.00 mg/dL   Calcium 9.6 8.9 - 10.3 mg/dL   Total Protein 7.7 6.5 - 8.1 g/dL   Albumin 4.2 3.5 - 5.0 g/dL   AST 13 (L) 15 - 41 U/L   ALT 12 0 - 44 U/L   Alkaline Phosphatase 105 38 - 126 U/L   Total Bilirubin 0.2 (L) 0.3 -  1.2 mg/dL   GFR calc non Af Amer >60 >60 mL/min   GFR calc Af Amer >60 >60 mL/min   Anion gap 10 5 - 15    Comment: Performed at [facility], [place] [address], [place], [place] [address]  Acetaminophen level     Status: Abnormal   Collection Time: 09/13/18  9:11 PM  Result Value Ref Range   Acetaminophen (Tylenol), Serum <10 (L) 10 - 30 ug/mL    Comment: (NOTE) Therapeutic concentrations vary significantly. A range of 10-30 ug/mL  may be an effective concentration for many patients. However, some  are best treated at concentrations outside of this range. Acetaminophen concentrations >150 ug/mL at 4 hours after ingestion  and >50 ug/mL at 12 hours after ingestion are often associated with  toxic reactions. Performed at [facility], [place] [address] [place], [place] [address]   Salicylate level     Status: None   Collection Time: 09/13/18  9:11 PM  Result Value Ref Range   Salicylate Lvl <6.2 2.8 - 30.0 mg/dL    Comment: Performed at [facility], [place] [address] [place], [place] [address]  CBC with Differential     Status: Abnormal   Collection Time: 09/13/18  9:11 PM  Result Value Ref Range   WBC 8.6 4.0 - 10.5 K/uL   RBC 5.44 (H) 3.87 - 5.11 MIL/uL   Hemoglobin 15.6 (H) 12.0 - 15.0 g/dL   HCT 48.2 (H) 36.0 - 46.0 %   MCV 88.6 80.0 - 100.0 fL   MCH 28.7 26.0 - 34.0 pg   MCHC 32.4 30.0 - 36.0 g/dL   RDW 15.7 (H) 11.5 - 15.5 %   Platelets 249 150 -  400 K/uL   nRBC 0.0 0.0 - 0.2 %   Neutrophils Relative % 57 %   Neutro Abs 5.0 1.7 - 7.7 K/uL   Lymphocytes Relative 37 %   Lymphs Abs 3.2 0.7 - 4.0 K/uL   Monocytes Relative 4 %   Monocytes Absolute 0.3 0.1 - 1.0 K/uL   Eosinophils Relative 1 %   Eosinophils Absolute 0.1 0.0 - 0.5 K/uL   Basophils Relative 1 %   Basophils Absolute 0.0 0.0 - 0.1 K/uL   Immature Granulocytes 0 %   Abs Immature Granulocytes 0.03 0.00 - 0.07 K/uL    Comment: Performed at [facility], [place] [address] [place], [place] [address]  Urinalysis, Routine w reflex microscopic     Status: Abnormal   Collection Time: 09/13/18  9:11 PM  Result Value Ref Range   Color, Urine YELLOW YELLOW   APPearance CLEAR CLEAR   Specific Gravity, Urine 1.022 1.005 - 1.030   pH 6.0 5.0 - 8.0   Glucose, UA >=500 (A) NEGATIVE mg/dL   Hgb urine dipstick NEGATIVE NEGATIVE   Bilirubin Urine NEGATIVE NEGATIVE   Ketones, ur 5 (A) NEGATIVE mg/dL   Protein, ur NEGATIVE NEGATIVE mg/dL   Nitrite NEGATIVE NEGATIVE   Leukocytes,Ua NEGATIVE NEGATIVE   RBC / HPF 0-5 0 - 5 RBC/hpf   WBC, UA 0-5 0 - 5 WBC/hpf   Bacteria, UA NONE SEEN NONE SEEN   Squamous Epithelial / LPF 0-5 0 - 5   Mucus PRESENT     Comment: Performed at [facility], [place] [address], [place], [place] [address]  I-Stat beta hCG blood, ED     Status: None   Collection Time: 09/13/18  9:20 PM  Result Value Ref Range   I-stat hCG, quantitative <5.0 <5 mIU/mL   Comment 3            Comment:   GEST. AGE      CONC.  (mIU/mL)   <=1 WEEK        5 - 50     2 WEEKS       50 - 500     3 WEEKS       100 - 10,000     4 WEEKS     1,000 - 30,000        FEMALE AND NON-PREGNANT FEMALE:     LESS THAN 5 mIU/mL   Urine rapid drug screen (hosp performed)     Status: None   Collection Time: 09/13/18 10:52 PM  Result Value Ref Range   Opiates NONE DETECTED NONE DETECTED   Cocaine NONE DETECTED NONE DETECTED   Benzodiazepines NONE DETECTED NONE DETECTED   Amphetamines NONE DETECTED NONE DETECTED   Tetrahydrocannabinol NONE DETECTED NONE DETECTED   Barbiturates NONE DETECTED NONE DETECTED    Comment: (NOTE) DRUG SCREEN FOR MEDICAL PURPOSES ONLY.  IF CONFIRMATION IS NEEDED FOR ANY PURPOSE, NOTIFY LAB WITHIN 5 DAYS. LOWEST DETECTABLE LIMITS FOR URINE DRUG SCREEN Drug Class                     Cutoff (ng/mL) Amphetamine and metabolites    1000 Barbiturate and metabolites    200 Benzodiazepine                  761 Tricyclics and metabolites     300 Opiates and metabolites        300 Cocaine and metabolites        300  THC                            50 Performed at [facility], [place] [address], [place], [place] [address]   Glucose, capillary     Status: Abnormal   Collection Time: 09/14/18  1:07 AM  Result Value Ref Range   Glucose-Capillary 236 (H) 70 - 99 mg/dL  CBC     Status: Abnormal   Collection Time: 09/14/18  6:29 AM  Result Value Ref Range   WBC 7.1 4.0 - 10.5 K/uL   RBC 5.20 (H) 3.87 - 5.11 MIL/uL   Hemoglobin 14.7 12.0 - 15.0 g/dL   HCT 45.1 36.0 - 46.0 %   MCV 86.7 80.0 - 100.0 fL   MCH 28.3 26.0 - 34.0 pg   MCHC 32.6 30.0 - 36.0 g/dL   RDW 15.3 11.5 - 15.5 %   Platelets 233 150 - 400 K/uL   nRBC 0.0 0.0 - 0.2 %    Comment: Performed at [facility], [place] [address], [place], [place] [address]  Comprehensive metabolic panel     Status: Abnormal   Collection Time: 09/14/18  6:29 AM  Result Value Ref Range   Sodium 138 135 - 145 mmol/L   Potassium 3.5 3.5 - 5.1 mmol/L   Chloride 104 98 - 111 mmol/L   CO2 24 22 - 32 mmol/L   Glucose, Bld 203 (H) 70 - 99 mg/dL   BUN 8 6 - 20 mg/dL   Creatinine, Ser 0.74 0.44 - 1.00 mg/dL   Calcium 9.4 8.9 - 10.3 mg/dL   Total Protein 7.4 6.5 - 8.1 g/dL   Albumin 4.1 3.5 - 5.0 g/dL   AST 12 (L) 15 - 41 U/L   ALT 11 0 - 44 U/L   Alkaline Phosphatase 94 38 - 126 U/L   Total Bilirubin 0.4 0.3 - 1.2 mg/dL   GFR calc non Af Amer >60 >60 mL/min   GFR calc Af Amer >60 >60 mL/min   Anion gap 10 5 - 15    Comment: Performed at [facility], [place] [address], [place], [place] [address]  Lipid panel     Status: Abnormal   Collection Time: 09/14/18  6:29 AM  Result Value Ref Range   Cholesterol 165 0 - 200 mg/dL   Triglycerides 148 <150 mg/dL   HDL 30 (L) >40 mg/dL   Total CHOL/HDL Ratio 5.5 RATIO   VLDL 30 0 - 40 mg/dL   LDL Cholesterol 105 (H) 0 - 99 mg/dL    Comment:        Total  Cholesterol/HDL:CHD Risk Coronary Heart Disease Risk Table                     Men   Women  1/2 Average Risk   3.4   3.3  Average Risk       5.0   4.4  2 X Average Risk   9.6   7.1  3 X Average Risk  23.4   11.0        Use the calculated Patient Ratio above and the CHD Risk Table to determine the patient's CHD Risk.        ATP III CLASSIFICATION (LDL):  <100     mg/dL   Optimal  100-129  mg/dL   Near or Above  Optimal  130-159  mg/dL   Borderline  160-189  mg/dL   High  >190     mg/dL   Very High Performed at [place] [address] [place], [place] [address]   TSH     Status: None   Collection Time: 09/14/18  6:29 AM  Result Value Ref Range   TSH 1.784 0.350 - 4.500 uIU/mL    Comment: Performed by a 3rd Generation assay with a functional sensitivity of <=0.01 uIU/mL. Performed at [facility], [place] [address], [place], [place] [address]     Blood Alcohol level:  Lab Results  Component Value Date   [facility] <10 08/25/2017   ETH <5 33/54/5625    Metabolic Disorder Labs:  Lab Results  Component Value Date   HGBA1C 8.8 (A) 06/12/2018   MPG 189 (H) 06/10/2013   MPG 189 (H) 04/20/2012   No results found for: PROLACTIN Lab Results  Component Value Date   CHOL 165 09/14/2018   TRIG 148 09/14/2018   HDL 30 (L) 09/14/2018   CHOLHDL 5.5 09/14/2018   VLDL 30 09/14/2018   LDLCALC 105 (H) 09/14/2018   LDLCALC 117 (H) 06/12/2018    Current Medications: Current Facility-Administered Medications  Medication Dose Route Frequency Provider Last Rate Last Dose  . acetaminophen (TYLENOL) tablet 650 mg  650 mg Oral Q6H PRN Money, Lowry Ram, FNP      . alum & mag hydroxide-simeth (MAALOX/MYLANTA) 200-200-20 MG/5ML suspension 30 mL  30 mL Oral Q4H PRN Money, Darnelle Maffucci B, FNP      . amLODipine (NORVASC) tablet 10 mg  10 mg Oral Daily Money, Lowry Ram, FNP   10 mg at 09/14/18 6389  . hydrOXYzine (ATARAX/VISTARIL) tablet 25 mg  25  mg Oral TID PRN Money, Lowry Ram, FNP   25 mg at 09/14/18 0150  . insulin glargine (LANTUS) injection 18 Units  18 Units Subcutaneous QHS Simon, Spencer E, PA-C      . losartan (COZAAR) tablet 100 mg  100 mg Oral Daily Money, Lowry Ram, FNP   100 mg at 09/14/18 3734  . magnesium hydroxide (MILK OF MAGNESIA) suspension 30 mL  30 mL Oral Daily PRN Money, Darnelle Maffucci B, FNP      . metFORMIN (GLUCOPHAGE) tablet 1,000 mg  1,000 mg Oral BID WC Money, Darnelle Maffucci B, FNP   1,000 mg at 09/14/18 0807  . metoprolol tartrate (LOPRESSOR) tablet 100 mg  100 mg Oral BID Money, Lowry Ram, FNP   100 mg at 09/14/18 0809  . pravastatin (PRAVACHOL) tablet 40 mg  40 mg Oral q1800 Money, Lowry Ram, FNP      . spironolactone (ALDACTONE) tablet 50 mg  50 mg Oral Daily Money, Lowry Ram, FNP   50 mg at 09/14/18 2876  . traZODone (DESYREL) tablet 50 mg  50 mg Oral QHS PRN Money, Lowry Ram, FNP   50 mg at 09/14/18 0150  . ziprasidone (GEODON) capsule 60 mg  60 mg Oral BID WC Money, Lowry Ram, FNP   60 mg at 09/14/18 8115   PTA Medications: Medications Prior to Admission  Medication Sig Dispense Refill Last Dose  . Accu-Chek FastClix Lancets MISC Use to check you blood sugar 3 times daily 306 each 12   . albuterol (VENTOLIN HFA) 108 (90 Base) MCG/ACT inhaler Inhale 1-2 puffs into the lungs every 6 (six) hours as needed for wheezing or shortness of breath. 18 g 5 unk  . amLODipine (NORVASC) 10 MG tablet Take 1  tablet (10 mg total) by mouth daily. For high blood pressure 90 tablet 3 2 weeks  . Blood Glucose Monitoring Suppl (ACCU-CHEK GUIDE) w/Device KIT 1 Device by Does not apply route 3 (three) times daily. Use to check blood sugars 3 times daily. 1 kit 0   . esomeprazole (NEXIUM) 40 MG capsule TAKE 1 CAPSULE(40 MG) BY MOUTH TWICE DAILY BEFORE A MEAL FOR ACID REFLUX 180 capsule 2 2 weeks  . ferrous sulfate 325 (65 FE) MG tablet Take 1 tablet (325 mg total) by mouth every other day. (May buy from over the counter): For anemia 15 tablet 2 2  weeks  . glucose blood (ACCU-CHEK GUIDE) test strip Use as instructed 100 each 12   . Insulin Glargine (LANTUS) 100 UNIT/ML Solostar Pen Inject 21 Units into the skin daily. 15 mL 11 2 weeks  . Insulin Pen Needle (PEN NEEDLES) 31G X 5 MM MISC Inject 18 Units into the skin at bedtime. Use to inject insulin daily at bedtime: For diabetes management: 100 each 2   . loratadine (CLARITIN) 10 MG tablet Take 1 tablet (10 mg total) by mouth daily. (May buy from over the counter): For allergies 30 tablet 2 2 weeks  . losartan (COZAAR) 100 MG tablet Take 1 tablet (100 mg total) by mouth daily. For high blood pressure 90 tablet 3 2 weeks  . lovastatin (MEVACOR) 40 MG tablet Take 1 tablet (40 mg total) by mouth daily. 90 tablet 3 2 weeks  . metFORMIN (GLUCOPHAGE) 1000 MG tablet Take 1 tablet (1,000 mg total) by mouth 2 (two) times daily with a meal. For diabetes management 180 tablet 3 2 weeks  . metoprolol tartrate (LOPRESSOR) 100 MG tablet Take 1 tablet (100 mg total) by mouth 2 (two) times daily. For high blood pressure 180 tablet 3 2 weeks  . spironolactone (ALDACTONE) 50 MG tablet Take 1 tablet (50 mg total) by mouth daily. For high blood pressure 90 tablet 3 2 weeks  . traZODone (DESYREL) 100 MG tablet Take 1 tablet (100 mg total) by mouth at bedtime as needed for sleep. For sleep 30 tablet 2 2 weeks  . ziprasidone (GEODON) 60 MG capsule Take 1 capsule (60 mg total) by mouth 2 (two) times daily with a meal. For mood control 60 capsule 0 2 weeks    Musculoskeletal: Strength & Muscle Tone: within normal limits Gait & Station: normal Patient leans: N/A  Psychiatric Specialty Exam: Physical Exam  Vitals reviewed. Constitutional: She appears well-developed.  Cardiovascular: Normal rate.  Psychiatric: She has a normal mood and affect. Her behavior is normal.    Review of Systems  Psychiatric/Behavioral: Positive for hallucinations. Negative for depression and suicidal ideas. The patient is  nervous/anxious.   All other systems reviewed and are negative.   Blood pressure (!) 150/98, pulse 85, temperature 97.8 F (36.6 C), temperature source Oral, resp. rate 18, SpO2 100 %.There is no height or weight on file to calculate BMI.  General Appearance: Casual  Eye Contact:  Fair  Speech:  Clear and Coherent  Volume:  Normal  Mood:  Anxious, Depressed and Dysphoric  Affect:  Congruent  Thought Process:  Coherent  Orientation:  Full (Time, Place, and Person)  Thought Content:  Hallucinations: None and Rumination  Suicidal Thoughts:  No  Homicidal Thoughts:  No  Memory:  Immediate;   Fair Recent;   Fair  Judgement:  Fair  Insight:  Fair  Psychomotor Activity:  Normal  Concentration:  Concentration: Fair  Recall:  [place] of Knowledge:  Fair  Language:  Fair  Akathisia:  No  Handed:  Right  AIMS (if indicated):     Assets:  Communication Skills Desire for Improvement Resilience Social Support  ADL's:  Intact  Cognition:  WNL  Sleep:  Number of Hours: 1.25    Treatment Plan Summary: Daily contact with patient to assess and evaluate symptoms and progress in treatment and Medication management  Observation Level/Precautions:  15 minute checks  Laboratory:  CBC Chemistry Profile HbAIC UDS  Psychotherapy:  Individual and group sessions  Medications:   See SRA by MD  Consultations: CSW and Psychiatry    Discharge Concerns:   Safety, stabilization, and risk of access to medication and medication stabilization    Estimated LOS: 5-7 day   Other:     Physician Treatment Plan for Primary Diagnosis: Bipolar I disorder, most recent episode (or current) manic ([place]) Long Term Goal(s): Improvement in symptoms so as ready for discharge  Short Term Goals: Ability to identify changes in lifestyle to reduce recurrence of condition will improve, Ability to verbalize feelings will improve, Ability to demonstrate self-control will improve, Ability to identify and develop  effective coping behaviors will improve and Compliance with prescribed medications will improve  Physician Treatment Plan for Secondary Diagnosis: Principal Problem:   Bipolar I disorder, most recent episode (or current) manic ([place])  Long Term Goal(s): Improvement in symptoms so as ready for discharge  Short Term Goals: Ability to verbalize feelings will improve, Ability to identify and develop effective coping behaviors will improve, Ability to maintain clinical measurements within normal limits will improve and Compliance with prescribed medications will improve  I certify that inpatient services furnished can reasonably be expected to improve the patient's condition.    Derrill Center, NP 5/31/202010:53 AM   I have discussed case with NP and have met with patient  Agree with NP note and assessment  53 year old female , presented to hospital with family members, who expressed concern regarding increasingly bizarre behavior, emotional lability, religious preoccupations,delusions ( making statements that she was God's wife ), not sleeping. Report is that she has been off her medications for about two weeks. Patient presents as fair historian at this time. Presents calm but vaguely irritable. States " I've been tired, that's all".  Currently denies depression, neuro-vegetative symptoms or suicidal ideations.  Patient has history of psychiatric illness, and has been diagnosed with Schizoaffective Disorder in the past . She was admitted to [facility] in May of 2019 for similar presentation. At the time was discharged on Geodon and Trazodone. Denies alcohol or drug abuse 5/30 UDS negative. 5/30 CBG 291, 5/31 236. BP today 150/98 Medical History - DM, HTN Dx- Schizoaffective Disorder Plan- inpatient admission. Resume Geodon ( reports she has been off for several days, denies having had side effects) Check EKG to monitor QTc Resumed  home medications- ( Insulin Lantus/ Metformin/ Novolog sliding  scale)  for DM and Lopressor, Losartan, Amlodipine, Aldactone for HTN. Have reviewed with hospitalist  consultant

## 2018-09-14 NOTE — Progress Notes (Signed)
Admission Note:  D:52 yr female who presents [facility] in no acute distress for the treatment of psychosis. When Probation officer and MHT entered search room pt immediately stated" You are my son and you are my daughter " . Pt continued with this delusion during the admission. Pt presented very labile only stating things like" I don't have a birthday, I'm Jesus wife, you're my children, I know Jesus is behind this, My name is Alleya". Pt initially refused to have skin search performed, but cooperated . Admission was completed as much as could be allowed, pt refused to get her height and weight  taken. Pt very delusional and labile at this time.    A:Skin was assessed by nurse Kennyth Lose and found to be WDL. PT searched and no contraband found, POC and unit policies explained and pt not receptive at this time. Consents not obtained due to pt refusing to sign paperwork , pt only signed fall risk sheet. Food and fluids offered, and fluids accepted.   R:Pt had no additional questions or concerns.

## 2018-09-14 NOTE — Plan of Care (Signed)
D: Patient is alert and cooperative. Denies SI, HI, AVH, and verbally contracts for safety. She presents as mildly confused and disorganized. Affect is anxious. Patient reports she is feeling better than when she got here. Patient denies physical symptoms/pain.    A: Medications administered per MD order. Support provided. Patient educated on safety on the unit and medications. Routine safety checks every 15 minutes. Patient stated understanding to tell nurse about any new physical symptoms. Patient understands to tell staff of any needs.     R: No adverse drug reactions noted. Patient verbally contracts for safety. Patient remains safe at this time and will continue to monitor.   Problem: Education: Goal: Knowledge of [facility] Education information/materials will improve Outcome: Progressing   Problem: Safety: Goal: Periods of time without injury will increase Outcome: Progressing   Patient oriented to the unit. Patient remains safe and will continue to monitor.   [place] NOVEL CORONAVIRUS (COVID-19) DAILY CHECK-OFF SYMPTOMS - answer yes or no to each - every day NO YES  Have you had a fever in the past 24 hours?  Fever (Temp > 37.80C / 100F) X   Have you had any of these symptoms in the past 24 hours? New Cough  Sore Throat   Shortness of Breath  Difficulty Breathing  Unexplained Body Aches   X   Have you had any one of these symptoms in the past 24 hours not related to allergies?   Runny Nose  Nasal Congestion  Sneezing   X   If you have had runny nose, nasal congestion, sneezing in the past 24 hours, has it worsened?  X   EXPOSURES - check yes or no X   Have you traveled outside the state in the past 14 days?  X   Have you been in contact with someone with a confirmed diagnosis of COVID-19 or PUI in the past 14 days without wearing appropriate PPE?  X   Have you been living in the same home as a person with confirmed diagnosis of COVID-19 or a PUI (household  contact)?    X   Have you been diagnosed with COVID-19?    X              What to do next: Answered NO to all: Answered YES to anything:   Proceed with unit schedule Follow the [facility] Inpatient Flowsheet.

## 2018-09-14 NOTE — Progress Notes (Signed)
While in the quiet room , pt continued to talk to people not seen by staff.

## 2018-09-14 NOTE — Progress Notes (Signed)
D. Pt presents with an anxious affect/ mood- bizarre at times-  When receiving her am meds, pt asked, "is this the government?" and "is this the mafia?". Pt has been calm and cooperative and was observed interacting appropriately with peers. Pt currently denies SI/HI and AVH A. Labs and vitals monitored. Pt compliant with medications. Pt supported emotionally and encouraged to express concerns and ask questions.   R. Pt remains safe with 15 minute checks. Will continue POC.

## 2018-09-14 NOTE — Tx Team (Signed)
Initial Treatment Plan 09/14/2018 2:27 AM NAYLEEN JANOSIK [id number]    PATIENT STRESSORS: Marital or family conflict Medication change or noncompliance   PATIENT STRENGTHS: General fund of knowledge Supportive family/friends   PATIENT IDENTIFIED PROBLEMS: psychosis  "I'm Jesus Wife, My name is Alleya and I've been around forever"                   DISCHARGE CRITERIA:  Improved stabilization in mood, thinking, and/or behavior Verbal commitment to aftercare and medication compliance  PRELIMINARY DISCHARGE PLAN: Attend PHP/IOP Attend 12-step recovery group  PATIENT/FAMILY INVOLVEMENT: This treatment plan has been presented to and reviewed with the patient, ELIZBETH POSA.  The patient and family have been given the opportunity to ask questions and make suggestions.  Providence Crosby, RN 09/14/2018, 2:27 AM

## 2018-09-15 LAB — LIPID PANEL
Cholesterol: 142 mg/dL (ref 0–200)
HDL: 30 mg/dL — ABNORMAL LOW (ref >40)
LDL Cholesterol: 88 mg/dL (ref 0–99)
Total CHOL/HDL Ratio: 4.7 RATIO
Triglycerides: 118 mg/dL (ref <150)
VLDL: 24 mg/dL (ref 0–40)

## 2018-09-15 LAB — HEMOGLOBIN A1C
Hgb A1c MFr Bld: 9.5 % — ABNORMAL HIGH (ref 4.8–5.6)
Mean Plasma Glucose: 225.95 mg/dL

## 2018-09-15 LAB — GLUCOSE, CAPILLARY
Glucose-Capillary: 171 mg/dL — ABNORMAL HIGH (ref 70–99)
Glucose-Capillary: 268 mg/dL — ABNORMAL HIGH (ref 70–99)
Glucose-Capillary: 225 mg/dL — ABNORMAL HIGH (ref 70–99)
Glucose-Capillary: 180 mg/dL — ABNORMAL HIGH (ref 70–99)

## 2018-09-15 MED ORDER — PERPHENAZINE 4 MG PO TABS
4.0000 mg | ORAL_TABLET | Freq: Two times a day (BID) | ORAL | Status: DC
  Administered 2018-09-15 – 2018-09-16 (×2): 4 mg via ORAL
  Filled 2018-09-15 (×6): qty 1

## 2018-09-15 MED ORDER — TEMAZEPAM 30 MG PO CAPS
30.0000 mg | ORAL_CAPSULE | Freq: Once | ORAL | Status: AC
  Administered 2018-09-15: 30 mg via ORAL
  Filled 2018-09-15: qty 1

## 2018-09-15 MED ORDER — NICOTINE 21 MG/24HR TD PT24
21.0000 mg | MEDICATED_PATCH | Freq: Every day | TRANSDERMAL | Status: DC
  Administered 2018-09-15 – 2018-09-17 (×2): 21 mg via TRANSDERMAL
  Filled 2018-09-15 (×6): qty 1

## 2018-09-15 NOTE — Progress Notes (Signed)
Recreation Therapy Notes  INPATIENT RECREATION THERAPY ASSESSMENT  Patient Details Name: [name] MRN: [id number] DOB: 06-Mar-1966 Today's Date: 09/15/2018       Information Obtained From: Patient  Able to Participate in Assessment/Interview: Yes  Patient Presentation: Alert  Reason for Admission (Per Patient): Other (Comments)(Pt stated she wasn't sure but came to the hospital for medical reasons.)  Patient Stressors: (Pt stated none.)  Coping Skills:   Music, Talk, Prayer, Read, Dance  Leisure Interests (2+):  Individual - Other (Comment), Music - Singing, Music - Other (Comment)(Pray; Dance)  Frequency of Recreation/Participation: Other (Comment)(Daily)  Awareness of Community Resources:  Yes  Community Resources:  Park, Other (Comment)([place])  Current Use: Yes  If no, Barriers?:    Expressed Interest in [place]: No  [place] of Residence:  [place]  Patient Main Form of Transportation: Musician  Patient Strengths:  Kind; Generous  Patient Identified Areas of Improvement:  Learning to deal with stressful situations better; Cope  Patient Goal for Hospitalization:  "to go home"  Current SI (including self-harm):  No  Current HI:  No  Current AVH: No  Staff Intervention Plan: Group Attendance, Collaborate with Interdisciplinary Treatment Team  Consent to Intern Participation: N/A    Victorino Sparrow, LRT/CTRS  Victorino Sparrow A 09/15/2018, 2:28 PM

## 2018-09-15 NOTE — Progress Notes (Signed)
Recreation Therapy Notes  Date: 6.1.20 Time: 1000 Location: 500 Hall Dayroom  Group Topic: Coping Skills  Goal Area(s) Addresses:  Patient will identify the things that are holding them back. Patient will identify coping skills to deal with those things.  Behavioral Response: None  Intervention: Worksheet, pencils  Activity:  Building surveyor.  Patients were to identify things they feel are holding them back and write them inside the web.  Patients would then identify coping skills to deal with each challenge they identified and write it on the outside of the web.  Education: Radiographer, therapeutic, Dentist.   Education Outcome: Acknowledges understanding/In group clarification offered/Needs additional education.   Clinical Observations/Feedback:  Patient sat in group quietly but did not participate.    Victorino Sparrow, LRT/CTRS    Victorino Sparrow A 09/15/2018 11:44 AM
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
## 2018-09-15 NOTE — Progress Notes (Signed)
[facility] MD Progress Note  09/15/2018 1:43 PM [name]  MRN:  [id number] Subjective:    Patient very delusional about a nonexistent husband she is alert oriented to person place situation focused on discharge but again at one point stating she was married to God and my interview she states she "has a lot of people that she is close to but has 1 person that she is not married to because her husband" so still not reality based. Principal Problem: Bipolar I disorder, most recent episode (or current) manic ([place]) Diagnosis: Principal Problem:   Bipolar I disorder, most recent episode (or current) manic ([place])  Total Time spent with patient: 20 minutes    Past Medical History:  Past Medical History:  Diagnosis Date  . Alcohol abuse   . Anemia, iron deficiency   . Arteriosclerotic cardiovascular disease (ASCVD)    Minimal at cath in [facility].stress nuclear study in 8/08 with nl EF; neg stress echo in 2010  . Community acquired pneumonia 01/03/10, 05/2010, 04/2012   2011; with pleural effusion-hosp [facility] acute resp failure; intubated in Jan 2014 (HMPV pneumonia)  . Depression   . Diabetes mellitus, type 2 ([place]) 2000   Onset in 2000; no insulin  . Diarrhea 10/14/2013   Started 10/10/13, improved with Imodium.   . Diastolic dysfunction    grade 2 per echo 2011  . Dysphagia   . Gastroesophageal reflux disease    Schatzki's ring  . History of alcohol abuse 07/22/2007   Qualifier: Diagnosis of  By: Lenn Cal    . Hyperlipidemia   . Hypertension `   during treatment with Geodon  . Hypokalemia 12/25/2013  . Left knee pain 08/25/2014  . Obesity   . Oral candidiasis 05/16/2017  . PTSD (post-traumatic stress disorder)   . Pulmonary hypertension ([place]) 05/02/2012   Patient needs repeat echo in 06/2012   . Schizoaffective disorder    requiring multiple psychiatric admissions  . Viral URI 05/12/2013  . Viral wart on finger 10/08/2016  . Vision changes 08/12/2014    Past Surgical  History:  Procedure Laterality Date  . COLONOSCOPY  01/2006   internal hemorrhoids  . COLONOSCOPY  01/10/2012   Dr. Rourk:Single anal canal hemorrhoidal tag likely source of  trivial hematochezia; right-sided colonic diverticulosis  . DILATION AND CURETTAGE, DIAGNOSTIC / THERAPEUTIC  1992  . ESOPHAGEAL DILATION N/A 08/18/2015   Procedure: ESOPHAGEAL DILATION;  Surgeon: Daneil Dolin, MD;  Location: [facility];  Service: Endoscopy;  Laterality: N/A;  . ESOPHAGOGASTRODUODENOSCOPY  09/16/08   Dr. Trevor Iha hiatal hernia/excoriations involving the cardia and mucosa consistent with trauma, antral erosions  of linear petechiae ? gastritis versus early gastric antral vascular  ectasia.Marland Kitchen biopsy showed reactive gastropathy. No H. pylori.  . ESOPHAGOGASTRODUODENOSCOPY  09/2007   Dr. Evalee Mutton ring, dilated to 16 French Maloney dilator, small hiatal hernia, antral erosions, biopsies reactive gastropathy.  . ESOPHAGOGASTRODUODENOSCOPY (EGD) WITH PROPOFOL N/A 12/17/2013   ELF:YBOFBPZ antral erosions and petechiae. Small hiatal hernia. No endoscopic explanation for patient's symptoms  . ESOPHAGOGASTRODUODENOSCOPY (EGD) WITH PROPOFOL N/A 08/18/2015   Procedure: ESOPHAGOGASTRODUODENOSCOPY (EGD) WITH PROPOFOL;  Surgeon: Daneil Dolin, MD;  Location: [facility];  Service: Endoscopy;  Laterality: N/A;  0845  . SAVORY DILATION  07/17/2011   Fields-MAC sedation-->distal esophageal stricture s/p dilation, chronic gastritis, multiple ulcers in stomach. no h.pylori   Family History:  Family History  Problem Relation Age of Onset  . Hypertension Mother   . Stroke Father  deceased at age 31  . Colon cancer Other   . Heart disease Sister   . Diabetes Other   . High Cholesterol Other   . Arthritis Other   . Anesthesia problems Neg Hx   . Hypotension Neg Hx   . Malignant hyperthermia Neg Hx   . Pseudochol deficiency Neg Hx     Social History:  Social History   Substance and Sexual Activity   Alcohol Use No  . Alcohol/week: 0.0 standard drinks   Comment: hx of ETOH abuse     Social History   Substance and Sexual Activity  Drug Use No    Social History   Socioeconomic History  . Marital status: Single    Spouse name: Not on file  . Number of children: 2  . Years of education: some colge  . Highest education level: Not on file  Occupational History  . Occupation: Disability  . Occupation: UNEMPLOYED    Employer: UNEMPLOYED  Social Needs  . Financial resource strain: Not on file  . Food insecurity:    Worry: Not on file    Inability: Not on file  . Transportation needs:    Medical: Not on file    Non-medical: Not on file  Tobacco Use  . Smoking status: Current Some Day Smoker    Packs/day: 0.75    Years: 30.00    Pack years: 22.50    Types: Cigarettes    Start date: 05/18/2012    Last attempt to quit: 03/28/2017    Years since quitting: 1.4  . Smokeless tobacco: Never Used  . Tobacco comment: Cutting back   Substance and Sexual Activity  . Alcohol use: No    Alcohol/week: 0.0 standard drinks    Comment: hx of ETOH abuse  . Drug use: No  . Sexual activity: Not Currently    Partners: Male    Birth control/protection: None  Lifestyle  . Physical activity:    Days per week: Not on file    Minutes per session: Not on file  . Stress: Not on file  Relationships  . Social connections:    Talks on phone: Not on file    Gets together: Not on file    Attends religious service: Not on file    Active member of club or organization: Not on file    Attends meetings of clubs or organizations: Not on file    Relationship status: Not on file  Other Topics Concern  . 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 acetaminophen (TYLENOL) tablet 650 mg  650 mg Oral Q6H PRN Money, Lowry Ram, FNP      . alum & mag hydroxide-simeth (MAALOX/MYLANTA) 200-200-20 MG/5ML suspension 30 mL  30 mL Oral Q4H PRN Money, Lowry Ram, FNP      .  amLODipine (NORVASC) tablet 10 mg  10 mg Oral Daily Money, Lowry Ram, FNP   10 mg at 09/15/18 0805  . hydrOXYzine (ATARAX/VISTARIL) tablet 25 mg  25 mg Oral TID PRN Money, Lowry Ram, FNP   25 mg at 09/14/18 0150  . insulin aspart (novoLOG) injection 0-9 Units  0-9 Units Subcutaneous TID WC Cobos, Myer Peer, MD   5 Units at 09/15/18 1233  . insulin glargine (LANTUS) injection 18 Units  18 Units Subcutaneous QHS Laverle Hobby, PA-C   18 Units at 09/14/18 2109  . losartan (COZAAR) tablet 100 mg  100 mg Oral Daily Money, Lowry Ram, FNP   100 mg at 09/15/18 0805  . magnesium hydroxide (MILK OF MAGNESIA) suspension 30 mL  30 mL Oral Daily PRN Money, Darnelle Maffucci B, FNP      . metFORMIN (GLUCOPHAGE) tablet 1,000 mg  1,000 mg Oral BID WC Money, Darnelle Maffucci B, FNP   1,000 mg at 09/15/18 0805  . metoprolol tartrate (LOPRESSOR) tablet 100 mg  100 mg Oral BID Money, Lowry Ram, FNP   100 mg at 09/15/18 0805  . perphenazine (TRILAFON) tablet 4 mg  4 mg Oral BID Johnn Hai, MD      . pravastatin (PRAVACHOL) tablet 40 mg  40 mg Oral q1800 Money, Lowry Ram, FNP   40 mg at 09/14/18 1703  . spironolactone (ALDACTONE) tablet 50 mg  50 mg Oral Daily Money, Lowry Ram, FNP   50 mg at 09/15/18 0805  . traZODone (DESYREL) tablet 50 mg  50 mg Oral QHS PRN Money, Lowry Ram, FNP   50 mg at 09/14/18 0150    Lab Results:   Results for orders placed or performed during the hospital encounter of 09/13/18 (from the past 48 hour(s))  SARS Coronavirus 2 (CEPHEID - Performed in [place] hospital lab), Hosp Order     Status: None   Collection Time: 09/13/18  6:25 PM  Result Value Ref Range   SARS Coronavirus 2 NEGATIVE NEGATIVE    Comment: (NOTE) If result is NEGATIVE SARS-CoV-2 target nucleic acids are NOT DETECTED. The SARS-CoV-2 RNA is generally detectable in upper and lower  respiratory specimens during the acute phase of infection. The lowest  concentration of SARS-CoV-2 viral copies this assay can detect is 250  copies / mL. A negative result does not preclude SARS-CoV-2 infection  and should not be used as the sole basis for treatment or other  patient management decisions.  A negative result may occur with  improper specimen collection / handling, submission of specimen other  than nasopharyngeal swab, presence of viral mutation(s) within the  areas targeted by this assay, and inadequate number of viral copies  (<250 copies / mL). A negative result must be combined with clinical  observations, patient history, and epidemiological information. If result is POSITIVE SARS-CoV-2 target nucleic acids are DETECTED. The SARS-CoV-2 RNA is generally detectable in upper and lower  respiratory specimens dur ing the acute phase of infection.  Positive  results are indicative of active infection with SARS-CoV-2.  Clinical  correlation with patient history and other diagnostic information is  necessary to determine patient infection status.  Positive results do  not rule out bacterial infection or co-infection with other viruses. If result is PRESUMPTIVE POSTIVE SARS-CoV-2 nucleic acids MAY BE PRESENT.   A presumptive positive result was obtained on the submitted specimen  and confirmed on repeat testing.  While 2019 novel coronavirus  (SARS-CoV-2) nucleic acids may be present in the submitted sample  additional confirmatory testing may be necessary for epidemiological  and / or clinical management purposes  to differentiate between  SARS-CoV-2 and other Sarbecovirus currently known to infect humans.  If clinically indicated additional testing with an alternate test  methodology [id number]) is advised. The SARS-CoV-2 RNA is generally  detectable in upper and lower respiratory sp ecimens during the acute  phase of infection. The expected result is Negative. Fact Sheet for Patients:  StrictlyIdeas.no Fact Sheet for Healthcare Providers: BankingDealers.co.za This test is not yet approved or cleared by the [place] FDA and has been authorized for detection and/or diagnosis of SARS-CoV-2 by FDA under an Emergency Use Authorization (EUA).  This EUA will remain in effect (meaning this test can be used) for the duration of the COVID-19 declaration under Section 564(b)(1) of the Act, 21 U.S.C. section 360bbb-3(b)(1), unless the authorization is terminated or revoked sooner. Performed at [facility], [place] [address] [place], [place] [address]   Glucose, capillary     Status: Abnormal   Collection Time: 09/13/18  6:44 PM  Result Value Ref Range   Glucose-Capillary 291 (H) 70 - 99 mg/dL   Comment 1 Notify RN   Comprehensive metabolic panel     Status: Abnormal   Collection Time: 09/13/18  9:11 PM  Result Value Ref Range   Sodium 137 135 - 145 mmol/L   Potassium 3.3 (L) 3.5 - 5.1 mmol/L   Chloride 102 98 - 111 mmol/L   CO2 25 22 - 32 mmol/L   Glucose, Bld 294 (H) 70 - 99 mg/dL   BUN 10 6 - 20 mg/dL   Creatinine, Ser 0.89 0.44 - 1.00 mg/dL   Calcium 9.6 8.9 - 10.3 mg/dL   Total Protein 7.7 6.5 - 8.1 g/dL   Albumin 4.2 3.5 - 5.0 g/dL   AST 13 (L) 15 - 41 U/L   ALT 12 0 - 44 U/L   Alkaline Phosphatase 105 38 - 126 U/L   Total Bilirubin 0.2 (L) 0.3 - 1.2 mg/dL   GFR calc non Af Amer >60 >60 mL/min   GFR calc Af Amer  >60 >60 mL/min   Anion gap 10 5 - 15    Comment: Performed at [facility], [place] [address] [place], [place] [address]  Acetaminophen level     Status: Abnormal   Collection Time: 09/13/18  9:11 PM  Result Value Ref Range   Acetaminophen (Tylenol), Serum <10 (L) 10 - 30 ug/mL    Comment: (NOTE) Therapeutic concentrations vary significantly. A range of 10-30 ug/mL  may be an effective concentration for many patients. However, some  are best treated at concentrations outside of this range. Acetaminophen concentrations >150 ug/mL at 4 hours after ingestion  and >50 ug/mL at 12 hours after ingestion are often associated with  toxic reactions. Performed at [facility], [place] [address] [place], [place] [address]   Salicylate level     Status: None   Collection Time: 09/13/18  9:11 PM  Result Value Ref Range   Salicylate Lvl <8.8 2.8 - 30.0 mg/dL    Comment: Performed at [facility], [place] [address] [place], [place] [address]  CBC with Differential     Status: Abnormal   Collection Time: 09/13/18  9:11 PM  Result Value Ref Range   WBC 8.6 4.0 - 10.5 K/uL   RBC 5.44 (H) 3.87 - 5.11 MIL/uL   Hemoglobin 15.6 (H) 12.0 - 15.0 g/dL   HCT 48.2 (  H) 36.0 - 46.0 %   MCV 88.6 80.0 - 100.0 fL   MCH 28.7 26.0 - 34.0 pg   MCHC 32.4 30.0 - 36.0 g/dL   RDW 15.7 (H) 11.5 - 15.5 %   Platelets 249 150 - 400 K/uL   nRBC 0.0 0.0 - 0.2 %   Neutrophils Relative % 57 %   Neutro Abs 5.0 1.7 - 7.7 K/uL   Lymphocytes Relative 37 %   Lymphs Abs 3.2 0.7 - 4.0 K/uL   Monocytes Relative 4 %   Monocytes Absolute 0.3 0.1 - 1.0 K/uL   Eosinophils Relative 1 %   Eosinophils Absolute 0.1 0.0 - 0.5 K/uL   Basophils Relative 1 %   Basophils Absolute 0.0 0.0 - 0.1 K/uL   Immature Granulocytes 0 %   Abs Immature Granulocytes 0.03 0.00 - 0.07 K/uL    Comment: Performed at [facility], [place] [address], [place], [place] [address]   Urinalysis, Routine w reflex microscopic     Status: Abnormal   Collection Time: 09/13/18  9:11 PM  Result Value Ref Range   Color, Urine YELLOW YELLOW   APPearance CLEAR CLEAR   Specific Gravity, Urine 1.022 1.005 - 1.030   pH 6.0 5.0 - 8.0   Glucose, UA >=500 (A) NEGATIVE mg/dL   Hgb urine dipstick NEGATIVE NEGATIVE   Bilirubin Urine NEGATIVE NEGATIVE   Ketones, ur 5 (A) NEGATIVE mg/dL   Protein, ur NEGATIVE NEGATIVE mg/dL   Nitrite NEGATIVE NEGATIVE   Leukocytes,Ua NEGATIVE NEGATIVE   RBC / HPF 0-5 0 - 5 RBC/hpf   WBC, UA 0-5 0 - 5 WBC/hpf   Bacteria, UA NONE SEEN NONE SEEN   Squamous Epithelial / LPF 0-5 0 - 5   Mucus PRESENT     Comment: Performed at [facility], [place] [address], [place], [place] [address]  I-Stat beta hCG blood, ED     Status: None   Collection Time: 09/13/18  9:20 PM  Result Value Ref Range   I-stat hCG, quantitative <5.0 <5 mIU/mL   Comment 3            Comment:   GEST. AGE      CONC.  (mIU/mL)   <=1 WEEK        5 - 50     2 WEEKS       50 - 500     3 WEEKS       100 - 10,000     4 WEEKS     1,000 - 30,000        FEMALE AND NON-PREGNANT FEMALE:     LESS THAN 5 mIU/mL   Urine rapid drug screen (hosp performed)     Status: None   Collection Time: 09/13/18 10:52 PM  Result Value Ref Range   Opiates NONE DETECTED NONE DETECTED   Cocaine NONE DETECTED NONE DETECTED   Benzodiazepines NONE DETECTED NONE DETECTED   Amphetamines NONE DETECTED NONE DETECTED   Tetrahydrocannabinol NONE DETECTED NONE DETECTED   Barbiturates NONE DETECTED NONE DETECTED    Comment: (NOTE) DRUG SCREEN FOR MEDICAL PURPOSES ONLY.  IF CONFIRMATION IS NEEDED FOR ANY PURPOSE, NOTIFY LAB WITHIN 5 DAYS. LOWEST DETECTABLE LIMITS FOR URINE DRUG SCREEN Drug Class                     Cutoff (ng/mL) Amphetamine and metabolites    1000 Barbiturate and metabolites    200 Benzodiazepine  270 Tricyclics and metabolites     300 Opiates and metabolites         300 Cocaine and metabolites        300 THC                            50 Performed at [facility], [place] [address], [place], [place] [address]   Glucose, capillary     Status: Abnormal   Collection Time: 09/14/18  1:07 AM  Result Value Ref Range   Glucose-Capillary 236 (H) 70 - 99 mg/dL  CBC     Status: Abnormal   Collection Time: 09/14/18  6:29 AM  Result Value Ref Range   WBC 7.1 4.0 - 10.5 K/uL   RBC 5.20 (H) 3.87 - 5.11 MIL/uL   Hemoglobin 14.7 12.0 - 15.0 g/dL   HCT 45.1 36.0 - 46.0 %   MCV 86.7 80.0 - 100.0 fL   MCH 28.3 26.0 - 34.0 pg   MCHC 32.6 30.0 - 36.0 g/dL   RDW 15.3 11.5 - 15.5 %   Platelets 233 150 - 400 K/uL   nRBC 0.0 0.0 - 0.2 %    Comment: Performed at [facility], [place] [address] [place], [place] [address]  Comprehensive metabolic panel     Status: Abnormal   Collection Time: 09/14/18  6:29 AM  Result Value Ref Range   Sodium 138 135 - 145 mmol/L   Potassium 3.5 3.5 - 5.1 mmol/L   Chloride 104 98 - 111 mmol/L   CO2 24 22 - 32 mmol/L   Glucose, Bld 203 (H) 70 - 99 mg/dL   BUN 8 6 - 20 mg/dL   Creatinine, Ser 0.74 0.44 - 1.00 mg/dL   Calcium 9.4 8.9 - 10.3 mg/dL   Total Protein 7.4 6.5 - 8.1 g/dL   Albumin 4.1 3.5 - 5.0 g/dL   AST 12 (L) 15 - 41 U/L   ALT 11 0 - 44 U/L   Alkaline Phosphatase 94 38 - 126 U/L   Total Bilirubin 0.4 0.3 - 1.2 mg/dL   GFR calc non Af Amer >60 >60 mL/min   GFR calc Af Amer >60 >60 mL/min   Anion gap 10 5 - 15    Comment: Performed at [name], [place] [address] [place], [place] [address]  Lipid panel     Status: Abnormal   Collection Time: 09/14/18  6:29 AM  Result Value Ref Range   Cholesterol 165 0 - 200 mg/dL   Triglycerides 148 <150 mg/dL   HDL 30 (L) >40 mg/dL   Total CHOL/HDL Ratio 5.5 RATIO   VLDL 30 0 - 40 mg/dL   LDL Cholesterol 105 (H) 0 - 99 mg/dL    Comment:        Total Cholesterol/HDL:CHD Risk Coronary Heart Disease Risk Table                      Men   Women  1/2 Average Risk   3.4   3.3  Average Risk       5.0   4.4  2 X Average Risk   9.6   7.1  3 X Average Risk  23.4   11.0        Use the calculated Patient Ratio above and the CHD Risk Table to determine the patient's CHD Risk.        ATP III  CLASSIFICATION (LDL):  <100     mg/dL   Optimal  100-129  mg/dL   Near or Above                    Optimal  130-159  mg/dL   Borderline  160-189  mg/dL   High  >190     mg/dL   Very High Performed at [place] [address], [place], [place] [address]   TSH     Status: None   Collection Time: 09/14/18  6:29 AM  Result Value Ref Range   TSH 1.784 0.350 - 4.500 uIU/mL    Comment: Performed by a 3rd Generation assay with a functional sensitivity of <=0.01 uIU/mL. Performed at [facility], [place] [address], [place], [place] [address]   Glucose, capillary     Status: Abnormal   Collection Time: 09/14/18  4:38 PM  Result Value Ref Range   Glucose-Capillary 327 (H) 70 - 99 mg/dL   Comment 1 Notify RN   Glucose, capillary     Status: Abnormal   Collection Time: 09/14/18  8:03 PM  Result Value Ref Range   Glucose-Capillary 209 (H) 70 - 99 mg/dL  Glucose, capillary     Status: Abnormal   Collection Time: 09/15/18  5:43 AM  Result Value Ref Range   Glucose-Capillary 171 (H) 70 - 99 mg/dL  Hemoglobin A1c     Status: Abnormal   Collection Time: 09/15/18  6:26 AM  Result Value Ref Range   Hgb A1c MFr Bld 9.5 (H) 4.8 - 5.6 %    Comment: (NOTE) Pre diabetes:          5.7%-6.4% Diabetes:              >6.4% Glycemic control for   <7.0% adults with diabetes    Mean Plasma Glucose 225.95 mg/dL    Comment: Performed at [facility], [place] [address] [place],  [address]  Lipid panel     Status: Abnormal   Collection Time: 09/15/18  6:26 AM  Result Value Ref Range   Cholesterol 142 0 - 200 mg/dL   Triglycerides 118 <150 mg/dL   HDL 30 (L) >40 mg/dL   Total CHOL/HDL  Ratio 4.7 RATIO   VLDL 24 0 - 40 mg/dL   LDL Cholesterol 88 0 - 99 mg/dL    Comment:        Total Cholesterol/HDL:CHD Risk Coronary Heart Disease Risk Table                     Men   Women  1/2 Average Risk   3.4   3.3  Average Risk       5.0   4.4  2 X Average Risk   9.6   7.1  3 X Average Risk  23.4   11.0        Use the calculated Patient Ratio above and the CHD Risk Table to determine the patient's CHD Risk.        ATP III CLASSIFICATION (LDL):  <100     mg/dL   Optimal  100-129  mg/dL   Near or Above                    Optimal  130-159  mg/dL   Borderline  160-189  mg/dL   High  >190     mg/dL   Very High Performed at [facility],  [place] [address] [place], [place] [address]   Glucose, capillary     Status: Abnormal   Collection Time: 09/15/18 12:12 PM  Result Value Ref Range   Glucose-Capillary 268 (H) 70 - 99 mg/dL    Blood Alcohol level:  Lab Results  Component Value Date   ETH <10 08/25/2017   ETH <5 84/16/6063    Metabolic Disorder Labs: Lab Results  Component Value Date   HGBA1C 9.5 (H) 09/15/2018   MPG 225.95 09/15/2018   MPG 189 (H) 06/10/2013   No results found for: PROLACTIN Lab Results  Component Value Date   CHOL 142 09/15/2018   TRIG 118 09/15/2018   HDL 30 (L) 09/15/2018   CHOLHDL 4.7 09/15/2018   VLDL 24 09/15/2018   LDLCALC 88 09/15/2018   LDLCALC 105 (H) 09/14/2018    Physical Findings: AIMS: Facial and Oral Movements Muscles of Facial Expression: None, normal Lips and Perioral Area: None, normal Jaw: None, normal Tongue: None, normal,Extremity Movements Upper (arms, wrists, hands, fingers): None, normal Lower (legs, knees, ankles, toes): None, normal, Trunk Movements Neck, shoulders, hips: None, normal, Overall Severity Severity of abnormal movements (highest score from questions above): None, normal Incapacitation due to abnormal movements: None, normal Patient's awareness of abnormal movements (rate only  patient's report): No Awareness, Dental Status Current problems with teeth and/or dentures?: No Does patient usually wear dentures?: No  CIWA:  CIWA-Ar Total: 0 COWS:  COWS Total Score: 0  Musculoskeletal: Strength & Muscle Tone: within normal limits Gait & Station: normal Patient leans: N/A  Psychiatric Specialty Exam: Physical Exam  ROS  Blood pressure (!) 152/90, pulse 94, temperature 98.2 F (36.8 C), temperature source Oral, resp. rate 16, SpO2 100 %.There is no height or weight on file to calculate BMI.  General Appearance: Casual  Eye Contact:  nl  Speech:  Clear and Coherent  Volume:  Normal  Mood:  Euthymic  Affect:  Appropriate and Congruent  Thought Process:  Irrelevant and Descriptions of Associations: Circumstantial  Orientation:  Full (Time, Place, and Person)  Thought Content:  Illogical, Delusions and Tangential  Suicidal Thoughts:  No  Homicidal Thoughts:  No  Memory:  Recent;   Fair  Judgement:  Impaired  Insight:  Lacking  Psychomotor Activity:  Normal  Concentration:  Concentration: Good  Recall:  [employer] of Knowledge:  Fair  Language:  Fair  Akathisia:  Negative  Handed:  Right  AIMS (if indicated):     Assets:  Communication Skills Desire for Improvement  ADL's:  Intact  Cognition:  WNL  Sleep:  Number of Hours: 6     Treatment Plan Summary: Daily contact with patient to assess and evaluate symptoms and progress in treatment and Medication management continue reality-based therapy try different antipsychotic I doubt the Geodon will be effective for delusions try perphenazine  Johnn Hai, MD 09/15/2018, 1:43 PM

## 2018-09-15 NOTE — [facility] Group Notes (Signed)
[place] LCSW Group Therapy Note  Date/Time: 09/15/18, 1315  Type of Therapy and Topic:  Group Therapy:  Overcoming Obstacles  Participation Level:  active  Description of Group:    In this group patients will be encouraged to explore what they see as obstacles to their own wellness and recovery. They will be guided to discuss their thoughts, feelings, and behaviors related to these obstacles. The group will process together ways to cope with barriers, with attention given to specific choices patients can make. Each patient will be challenged to identify changes they are motivated to make in order to overcome their obstacles. This group will be process-oriented, with patients participating in exploration of their own experiences as well as giving and receiving support and challenge from other group members.  Therapeutic Goals: 1. Patient will identify personal and current obstacles as they relate to admission. 2. Patient will identify barriers that currently interfere with their wellness or overcoming obstacles.  3. Patient will identify feelings, thought process and behaviors related to these barriers. 4. Patient will identify two changes they are willing to make to overcome these obstacles:    Summary of Patient Progress: pt somewhat disjointed in her comments, stated that nobody has supported her for a long time.  Pt was quiet during a somewhat difficult group where other group members were needing to be redirected frequently--showed appropriate behavior throughout.      Therapeutic Modalities:   Cognitive Behavioral Therapy Solution Focused Therapy Motivational Interviewing Relapse Prevention Therapy  Lurline Idol, LCSW

## 2018-09-15 NOTE — Plan of Care (Signed)
D: Patient is alert, pleasant, and cooperative. Denies SI, HI, AVH, and verbally contracts for safety. Affect is preoccupied and sad. Patient reports she doesn't want to be here in the hospital, she states "I can't keep living like this". Patient denies physical symptoms/pain.   After night time medications administered and day room closed she wanders in to the hallway a couple times asking "is someone calling my name?". No one called her name. She is distressed and worried but calm. On call provider ordered restoril and it was administered per order.     A: Medications administered per MD order. Support provided. Patient educated on safety on the unit and medications. Routine safety checks every 15 minutes. Patient stated understanding to tell nurse about any new physical symptoms. Patient understands to tell staff of any needs.     R: No adverse drug reactions noted. Patient verbally contracts for safety. Patient remains safe at this time and will continue to monitor.   Problem: Safety: Goal: Periods of time without injury will increase Outcome: Progressing   Problem: Health Behavior/Discharge Planning: Goal: Compliance with prescribed medication regimen will improve Outcome: Progressing     Patient is willing to take medication as prescribed. Patient remains safe and will continue to monitor.   [place] NOVEL CORONAVIRUS (COVID-19) DAILY CHECK-OFF SYMPTOMS - answer yes or no to each - every day NO YES  Have you had a fever in the past 24 hours?  Fever (Temp > 37.80C / 100F) X   Have you had any of these symptoms in the past 24 hours? New Cough  Sore Throat   Shortness of Breath  Difficulty Breathing  Unexplained Body Aches   X   Have you had any one of these symptoms in the past 24 hours not related to allergies?   Runny Nose  Nasal Congestion  Sneezing   X   If you have had runny nose, nasal congestion, sneezing in the past 24 hours, has it worsened?  X   EXPOSURES - check  yes or no X   Have you traveled outside the state in the past 14 days?  X   Have you been in contact with someone with a confirmed diagnosis of COVID-19 or PUI in the past 14 days without wearing appropriate PPE?  X   Have you been living in the same home as a person with confirmed diagnosis of COVID-19 or a PUI (household contact)?    X   Have you been diagnosed with COVID-19?    X              What to do next: Answered NO to all: Answered YES to anything:   Proceed with unit schedule Follow the [facility] Inpatient Flowsheet.

## 2018-09-15 NOTE — Plan of Care (Signed)
Progress note  D: pt found in the hallway; compliant with medication administration. Pt denies any physical complaints or pain. Pt is visibly anxious though. Pt states she is ready to leave and has the help she needs now. Pt has been hypervigilant with this. Pt denies si/hi/ah/vh and verbally agrees to approach staff if these become apparent or before harming herself/others while at [place].  A: pt provided support and encouragement. Pt given medication per protocol and standing orders. Q46m safety checks implemented and continued.  R: pt safe on the unit. Will continue to monitor.   Pt progressing in the following metrics  Problem: Education: Goal: Emotional status will improve Outcome: Progressing Goal: Mental status will improve Outcome: Progressing Goal: Verbalization of understanding the information provided will improve Outcome: Progressing   Problem: Activity: Goal: Interest or engagement in activities will improve Outcome: Progressing

## 2018-09-15 NOTE — Tx Team (Signed)
Interdisciplinary Treatment and Diagnostic Plan Update  09/15/2018 Time of Session: [place] MRN: [id number]  Principal Diagnosis: Bipolar I disorder, most recent episode (or current) manic ([place])  Secondary Diagnoses: Principal Problem:   Bipolar I disorder, most recent episode (or current) manic ([place])   Current Medications:  Current Facility-Administered Medications  Medication Dose Route Frequency Provider Last Rate Last Dose  . acetaminophen (TYLENOL) tablet 650 mg  650 mg Oral Q6H PRN Money, Lowry Ram, FNP      . alum & mag hydroxide-simeth (MAALOX/MYLANTA) 200-200-20 MG/5ML suspension 30 mL  30 mL Oral Q4H PRN Money, Darnelle Maffucci B, FNP      . amLODipine (NORVASC) tablet 10 mg  10 mg Oral Daily Money, Lowry Ram, FNP   10 mg at 09/15/18 0805  . hydrOXYzine (ATARAX/VISTARIL) tablet 25 mg  25 mg Oral TID PRN Money, Lowry Ram, FNP   25 mg at 09/14/18 0150  . insulin aspart (novoLOG) injection 0-9 Units  0-9 Units Subcutaneous TID WC Cobos, Myer Peer, MD   5 Units at 09/15/18 1233  . insulin glargine (LANTUS) injection 18 Units  18 Units Subcutaneous QHS Laverle Hobby, PA-C   18 Units at 09/14/18 2109  . losartan (COZAAR) tablet 100 mg  100 mg Oral Daily Money, Lowry Ram, FNP   100 mg at 09/15/18 0805  . magnesium hydroxide (MILK OF MAGNESIA) suspension 30 mL  30 mL Oral Daily PRN Money, Darnelle Maffucci B, FNP      . metFORMIN (GLUCOPHAGE) tablet 1,000 mg  1,000 mg Oral BID WC Money, Darnelle Maffucci B, FNP   1,000 mg at 09/15/18 0805  . metoprolol tartrate (LOPRESSOR) tablet 100 mg  100 mg Oral BID Money, Lowry Ram, FNP   100 mg at 09/15/18 0805  . nicotine (NICODERM CQ - dosed in mg/24 hours) patch 21 mg  21 mg Transdermal Daily Johnn Hai, MD      . perphenazine (TRILAFON) tablet 4 mg  4 mg Oral BID Johnn Hai, MD      . pravastatin (PRAVACHOL) tablet 40 mg  40 mg Oral q1800 Money, Lowry Ram, FNP   40 mg at 09/14/18 1703  . spironolactone (ALDACTONE) tablet 50 mg  50 mg Oral Daily Money, Lowry Ram, FNP   50 mg at 09/15/18 0805  . traZODone (DESYREL) tablet 50 mg  50 mg Oral QHS PRN Money, Lowry Ram, FNP   50 mg at 09/14/18 0150   PTA Medications: Medications Prior to Admission  Medication Sig Dispense Refill Last Dose  . Accu-Chek FastClix Lancets MISC Use to check you blood sugar 3 times daily 306 each 12   . albuterol (VENTOLIN HFA) 108 (90 Base) MCG/ACT inhaler Inhale 1-2 puffs into the lungs every 6 (six) hours as needed for wheezing or shortness of breath. 18 g 5 unk  . amLODipine (NORVASC) 10 MG tablet Take 1 tablet (10 mg total) by mouth daily. For high blood pressure 90 tablet 3 2 weeks  . Blood Glucose Monitoring Suppl (ACCU-CHEK GUIDE) w/Device KIT 1 Device by Does not apply route 3 (three) times daily. Use to check blood sugars 3 times daily. 1 kit 0   . esomeprazole (NEXIUM) 40 MG capsule TAKE 1 CAPSULE(40 MG) BY MOUTH TWICE DAILY BEFORE A MEAL FOR ACID REFLUX 180 capsule 2 2 weeks  . ferrous sulfate 325 (65 FE) MG tablet Take 1 tablet (325 mg total) by mouth every other day. (May buy from over the counter): For anemia 15 tablet 2 2  weeks  . glucose blood (ACCU-CHEK GUIDE) test strip Use as instructed 100 each 12   . Insulin Glargine (LANTUS) 100 UNIT/ML Solostar Pen Inject 21 Units into the skin daily. 15 mL 11 2 weeks  . Insulin Pen Needle (PEN NEEDLES) 31G X 5 MM MISC Inject 18 Units into the skin at bedtime. Use to inject insulin daily at bedtime: For diabetes management: 100 each 2   . loratadine (CLARITIN) 10 MG tablet Take 1 tablet (10 mg total) by mouth daily. (May buy from over the counter): For allergies 30 tablet 2 2 weeks  . losartan (COZAAR) 100 MG tablet Take 1 tablet (100 mg total) by mouth daily. For high blood pressure 90 tablet 3 2 weeks  . lovastatin (MEVACOR) 40 MG tablet Take 1 tablet (40 mg total) by mouth daily. 90 tablet 3 2 weeks  . metFORMIN (GLUCOPHAGE) 1000 MG tablet Take 1 tablet (1,000 mg total) by mouth 2 (two) times daily with a meal. For diabetes  management 180 tablet 3 2 weeks  . metoprolol tartrate (LOPRESSOR) 100 MG tablet Take 1 tablet (100 mg total) by mouth 2 (two) times daily. For high blood pressure 180 tablet 3 2 weeks  . spironolactone (ALDACTONE) 50 MG tablet Take 1 tablet (50 mg total) by mouth daily. For high blood pressure 90 tablet 3 2 weeks  . traZODone (DESYREL) 100 MG tablet Take 1 tablet (100 mg total) by mouth at bedtime as needed for sleep. For sleep 30 tablet 2 2 weeks  . ziprasidone (GEODON) 60 MG capsule Take 1 capsule (60 mg total) by mouth 2 (two) times daily with a meal. For mood control 60 capsule 0 2 weeks    Patient Stressors: Marital or family conflict Medication change or noncompliance  Patient Strengths: General fund of knowledge Supportive family/friends  Treatment Modalities: Medication Management, Group therapy, Case management,  1 to 1 session with clinician, Psychoeducation, Recreational therapy.   Physician Treatment Plan for Primary Diagnosis: Bipolar I disorder, most recent episode (or current) manic ([place]) Long Term Goal(s): Improvement in symptoms so as ready for discharge Improvement in symptoms so as ready for discharge   Short Term Goals: Ability to identify changes in lifestyle to reduce recurrence of condition will improve Ability to verbalize feelings will improve Ability to demonstrate self-control will improve Ability to identify and develop effective coping behaviors will improve Compliance with prescribed medications will improve Ability to verbalize feelings will improve Ability to identify and develop effective coping behaviors will improve Ability to maintain clinical measurements within normal limits will improve Compliance with prescribed medications will improve  Medication Management: Evaluate patient's response, side effects, and tolerance of medication regimen.  Therapeutic Interventions: 1 to 1 sessions, Unit Group sessions and Medication  administration.  Evaluation of Outcomes: Not Met  Physician Treatment Plan for Secondary Diagnosis: Principal Problem:   Bipolar I disorder, most recent episode (or current) manic ([place])  Long Term Goal(s): Improvement in symptoms so as ready for discharge Improvement in symptoms so as ready for discharge   Short Term Goals: Ability to identify changes in lifestyle to reduce recurrence of condition will improve Ability to verbalize feelings will improve Ability to demonstrate self-control will improve Ability to identify and develop effective coping behaviors will improve Compliance with prescribed medications will improve Ability to verbalize feelings will improve Ability to identify and develop effective coping behaviors will improve Ability to maintain clinical measurements within normal limits will improve Compliance with prescribed medications will improve  Medication Management: Evaluate patient's response, side effects, and tolerance of medication regimen.  Therapeutic Interventions: 1 to 1 sessions, Unit Group sessions and Medication administration.  Evaluation of Outcomes: Not Met   RN Treatment Plan for Primary Diagnosis: Bipolar I disorder, most recent episode (or current) manic ([place]) Long Term Goal(s): Knowledge of disease and therapeutic regimen to maintain health will improve  Short Term Goals: Ability to identify and develop effective coping behaviors will improve and Compliance with prescribed medications will improve  Medication Management: RN will administer medications as ordered by provider, will assess and evaluate patient's response and provide education to patient for prescribed medication. RN will report any adverse and/or side effects to prescribing provider.  Therapeutic Interventions: 1 on 1 counseling sessions, Psychoeducation, Medication administration, Evaluate responses to treatment, Monitor vital signs and CBGs as ordered, Perform/monitor CIWA,  COWS, AIMS and Fall Risk screenings as ordered, Perform wound care treatments as ordered.  Evaluation of Outcomes: Not Met   LCSW Treatment Plan for Primary Diagnosis: Bipolar I disorder, most recent episode (or current) manic ([place]) Long Term Goal(s): Safe transition to appropriate next level of care at discharge, Engage patient in therapeutic group addressing interpersonal concerns.  Short Term Goals: Engage patient in aftercare planning with referrals and resources, Increase social support and Increase skills for wellness and recovery  Therapeutic Interventions: Assess for all discharge needs, 1 to 1 time with Social worker, Explore available resources and support systems, Assess for adequacy in community support network, Educate family and significant other(s) on suicide prevention, Complete Psychosocial Assessment, Interpersonal group therapy.  Evaluation of Outcomes: Not Met   Progress in Treatment: Attending groups: Yes. Participating in groups: Yes. Taking medication as prescribed: Yes. Toleration medication: Yes. Family/Significant other contact made: No, will contact:  sister Patient understands diagnosis: No. Discussing patient identified problems/goals with staff: Yes. Medical problems stabilized or resolved: Yes. Denies suicidal/homicidal ideation: Yes. Issues/concerns per patient self-inventory: No. Other: none  New problem(s) identified: No, Describe:  none  New Short Term/Long Term Goal(s):  Patient Goals:  "discharge"  Discharge Plan or Barriers:   Reason for Continuation of Hospitalization: Delusions  Medication stabilization  Estimated Length of Stay: 3-5 days.  Attendees: Patient:[name] 09/15/2018   Physician: Dr. Jake Samples, MD 09/15/2018   Nursing:  Neldon Newport, RN 09/15/2018   RN Care Manager: 09/15/2018   Social Worker: Lurline Idol, LCSW 09/15/2018   Recreational Therapist:  09/15/2018   Other:  09/15/2018   Other:  09/15/2018   Other: 09/15/2018         Scribe for Treatment Team: Joanne Chars, [place] 09/15/2018 4:07 PM

## 2018-09-16 LAB — PROLACTIN: Prolactin: 11.6 ng/mL (ref 4.8–23.3)

## 2018-09-16 LAB — GLUCOSE, CAPILLARY
Glucose-Capillary: 173 mg/dL — ABNORMAL HIGH (ref 70–99)
Glucose-Capillary: 150 mg/dL — ABNORMAL HIGH (ref 70–99)
Glucose-Capillary: 195 mg/dL — ABNORMAL HIGH (ref 70–99)
Glucose-Capillary: 211 mg/dL — ABNORMAL HIGH (ref 70–99)

## 2018-09-16 MED ORDER — PERPHENAZINE 8 MG PO TABS
8.0000 mg | ORAL_TABLET | Freq: Every day | ORAL | Status: DC
  Filled 2018-09-16 (×2): qty 1

## 2018-09-16 MED ORDER — TEMAZEPAM 30 MG PO CAPS
30.0000 mg | ORAL_CAPSULE | Freq: Every day | ORAL | Status: DC
  Administered 2018-09-16: 30 mg via ORAL
  Filled 2018-09-16: qty 1

## 2018-09-16 MED ORDER — PERPHENAZINE 4 MG PO TABS
4.0000 mg | ORAL_TABLET | Freq: Every day | ORAL | Status: DC
  Administered 2018-09-16 – 2018-09-17 (×2): 4 mg via ORAL
  Filled 2018-09-16 (×4): qty 1

## 2018-09-16 NOTE — Progress Notes (Signed)
D-pt seems to still not have any insight about her psychiatric symptoms, she seems to still be responding to internal stimuli according to her roommate, BP is still a little elevated, but her blood sugars have improved, pt c/o of not sleeping well last night A-pt took her medications, according to self inventory sheet pt feels extremely hopeless R-cont to monitor for safety

## 2018-09-16 NOTE — Progress Notes (Signed)
D: Pt denies SI/HI/AVH. Pt is pleasant and cooperative. Pt kept to herself much of the evening. Pt presented depressed and wanting to get discharged.  A: Pt was offered support and encouragement. Pt was given scheduled medications. Pt was encourage to attend groups. Q 15 minute checks were done for safety.  R:Pt attends groups and interacts well with peers and staff. Pt is taking medication. Pt receptive to treatment and safety maintained on unit.

## 2018-09-16 NOTE — Progress Notes (Signed)
Inpatient Diabetes Program Recommendations  AACE/ADA: New Consensus Statement on Inpatient Glycemic Control (2015)  Target Ranges:  Prepandial:   less than 140 mg/dL      Peak postprandial:   less than 180 mg/dL (1-2 hours)      Critically ill patients:  140 - 180 mg/dL   Lab Results  Component Value Date   GLUCAP 173 (H) 09/16/2018   HGBA1C 9.5 (H) 09/15/2018    Review of Glycemic Control  Diabetes history: DM2 Outpatient Diabetes medications: Lantus 21 units QD, metformin 1000 mg bid Current orders for Inpatient glycemic control: Lantus 18 units QHS, Novolog 0-9 units tidwc, metformin 1000 mg bid  HgbA1C - 9.5% - poor glycemic control PTA Post-prandials elevated - needs meal coverage insulin  Inpatient Diabetes Program Recommendations:     Add Novolog 4 units tidwc for meal coverage insulin Add HS correction  Will need to f/u with PCP regarding medication adjustment for HgbA1C of 9.5%  Continue to follow.   Thank you. Lorenda Peck, RD, LDN, CDE Inpatient Diabetes Coordinator [phone]

## 2018-09-16 NOTE — Progress Notes (Signed)
[facility] MD Progress Note  09/16/2018 1:16 PM [name]  MRN:  [id number] Subjective:  Patient continues to request multiple interviews and encounters to lobby for discharge stating she only came in because of her blood pressure and medical needs she either does not remember completely minimizes past psychiatric symptoms and recent level of disorganization and psychosis.  She denies wanting to harm her self or others she again is very delusional and very focused on her husband "someone is close to me and I think of him as a husband" but no longer believes this individual is God.  But again was quite psychotic upon presentation Principal Problem: Bipolar I disorder, most recent episode (or current) manic ([place]) Diagnosis: Principal Problem:   Bipolar I disorder, most recent episode (or current) manic ([place])  Total Time spent with patient: 20 minutes  Past Medical History:  Past Medical History:  Diagnosis Date  . Alcohol abuse   . Anemia, iron deficiency   . Arteriosclerotic cardiovascular disease (ASCVD)    Minimal at cath in [facility].stress nuclear study in 8/08 with nl EF; neg stress echo in 2010  . Community acquired pneumonia 01/03/10, 05/2010, 04/2012   2011; with pleural effusion-[facility]osp [facility] acute resp failure; intubated in Jan 2014 (HMPV pneumonia)  . Depression   . Diabetes mellitus, type 2 ([place]) 2000   Onset in 2000; no insulin  . Diarrhea 10/14/2013   Started 10/10/13, improved with Imodium.   . Diastolic dysfunction    grade 2 per echo 2011  . Dysphagia   . Gastroesophageal reflux disease    Schatzki's ring  . History of alcohol abuse 07/22/2007   Qualifier: Diagnosis of  By: Lenn Cal    . Hyperlipidemia   . Hypertension `   during treatment with Geodon  . Hypokalemia 12/25/2013  . Left knee pain 08/25/2014  . Obesity   . Oral candidiasis 05/16/2017  . PTSD (post-traumatic stress disorder)   . Pulmonary hypertension ([place]) 05/02/2012   Patient needs repeat echo  in 06/2012   . Schizoaffective disorder    requiring multiple psychiatric admissions  . Viral URI 05/12/2013  . Viral wart on finger 10/08/2016  . Vision changes 08/12/2014    Past Surgical History:  Procedure Laterality Date  . COLONOSCOPY  01/2006   internal hemorrhoids  . COLONOSCOPY  01/10/2012   Dr. Rourk:Single anal canal hemorrhoidal tag likely source of  trivial hematochezia; right-sided colonic diverticulosis  . DILATION AND CURETTAGE, DIAGNOSTIC / THERAPEUTIC  1992  . ESOPHAGEAL DILATION N/A 08/18/2015   Procedure: ESOPHAGEAL DILATION;  Surgeon: Daneil Dolin, MD;  [facility];  Service: Endoscopy;  Laterality: N/A;  . ESOPHAGOGASTRODUODENOSCOPY  09/16/08   Dr. Trevor Iha hiatal hernia/excoriations involving the cardia and mucosa consistent with trauma, antral erosions  of linear petechiae ? gastritis versus early gastric antral vascular  ectasia.Marland Kitchen biopsy showed reactive gastropathy. No H. pylori.  . ESOPHAGOGASTRODUODENOSCOPY  09/2007   Dr. Evalee Mutton ring, dilated to 24 French Maloney dilator, small hiatal hernia, antral erosions, biopsies reactive gastropathy.  . ESOPHAGOGASTRODUODENOSCOPY (EGD) WITH PROPOFOL N/A 12/17/2013   XAJ:OINOMVE antral erosions and petechiae. Small hiatal hernia. No endoscopic explanation for patient's symptoms  . ESOPHAGOGASTRODUODENOSCOPY (EGD) WITH PROPOFOL N/A 08/18/2015   Procedure: ESOPHAGOGASTRODUODENOSCOPY (EGD) WITH PROPOFOL;  Surgeon: Daneil Dolin, MD;  [facility];  Service: Endoscopy;  Laterality: N/A;  0845  . SAVORY DILATION  07/17/2011   Fields-MAC sedation-->distal esophageal stricture s/p dilation, chronic gastritis, multiple ulcers in stomach. no  h.pylori   Family History:  Family History  Problem Relation Age of Onset  . Hypertension Mother   . Stroke Father        deceased at age 54  . Colon cancer Other   . Heart disease Sister   . Diabetes Other   . High Cholesterol Other   . Arthritis Other   .  Anesthesia problems Neg Hx   . Hypotension Neg Hx   . Malignant hyperthermia Neg Hx   . Pseudochol deficiency Neg Hx    Family Psychiatric  History: neg Social History:  Social History   Substance and Sexual Activity  Alcohol Use No  . Alcohol/week: 0.0 standard drinks   Comment: hx of ETOH abuse     Social History   Substance and Sexual Activity  Drug Use No    Social History   Socioeconomic History  . Marital status: Single    Spouse name: Not on file  . Number of children: 2  . Years of education: some colge  . Highest education level: Not on file  Occupational History  . Occupation: Disability  . Occupation: UNEMPLOYED    Employer: UNEMPLOYED  Social Needs  . Financial resource strain: Not on file  . Food insecurity:    Worry: Not on file    Inability: Not on file  . Transportation needs:    Medical: Not on file    Non-medical: Not on file  Tobacco Use  . Smoking status: Current Some Day Smoker    Packs/day: 0.75    Years: 30.00    Pack years: 22.50    Types: Cigarettes    Start date: 05/18/2012    Last attempt to quit: 03/28/2017    Years since quitting: 1.4  . Smokeless tobacco: Never Used  . Tobacco comment: Cutting back   Substance and Sexual Activity  . Alcohol use: No    Alcohol/week: 0.0 standard drinks    Comment: hx of ETOH abuse  . Drug use: No  . Sexual activity: Not Currently    Partners: Male    Birth control/protection: None  Lifestyle  . Physical activity:    Days per week: Not on file    Minutes per session: Not on file  . Stress: Not on file  Relationships  . Social connections:    Talks on phone: Not on file    Gets together: Not on file    Attends religious service: Not on file    Active member of club or organization: Not on file    Attends meetings of clubs or organizations: Not on file    Relationship status: Not on file  Other Topics Concern  . 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 acetaminophen (TYLENOL) tablet 650 mg  650  mg Oral Q6H PRN Money, Lowry Ram, FNP      . alum & mag hydroxide-simeth (MAALOX/MYLANTA) 200-200-20 MG/5ML suspension 30 mL  30 mL Oral Q4H PRN Money, Darnelle Maffucci B, FNP      . amLODipine (NORVASC) tablet 10 mg  10 mg Oral Daily Money, Lowry Ram, FNP   10 mg at 09/16/18 0804  . hydrOXYzine (ATARAX/VISTARIL) tablet 25 mg  25 mg Oral TID PRN Money, Lowry Ram, FNP   25 mg at 09/14/18 0150  . insulin aspart (novoLOG) injection 0-9 Units  0-9 Units Subcutaneous TID WC Cobos, Myer Peer, MD   2 Units at 09/16/18 [phone]  . insulin glargine (LANTUS) injection 18 Units  18 Units Subcutaneous QHS Laverle Hobby, PA-C   18 Units at 09/15/18 2116  . losartan (COZAAR) tablet 100 mg  100 mg Oral Daily Money, Lowry Ram, FNP   100 mg at 09/16/18 0805  . magnesium hydroxide (MILK OF MAGNESIA) suspension 30 mL  30 mL Oral Daily PRN Money, Darnelle Maffucci B, FNP      . metFORMIN (GLUCOPHAGE) tablet 1,000 mg  1,000 mg Oral BID WC Money, Darnelle Maffucci B, FNP   1,000 mg at 09/16/18 0805  . metoprolol tartrate (LOPRESSOR) tablet 100 mg  100 mg Oral BID Money, Lowry Ram, FNP   100 mg at 09/16/18 0806  . nicotine (NICODERM CQ - dosed in mg/24 hours) patch 21 mg  21 mg Transdermal Daily Johnn Hai, MD   21 mg at 09/15/18 1719  . perphenazine (TRILAFON) tablet 4 mg  4 mg Oral Daily  Johnn Hai, MD      . Derrill Memo ON 09/17/2018] perphenazine (TRILAFON) tablet 8 mg  8 mg Oral QHS Johnn Hai, MD      . pravastatin (PRAVACHOL) tablet 40 mg  40 mg Oral q1800 Money, Lowry Ram, FNP   40 mg at 09/15/18 1716  . spironolactone (ALDACTONE) tablet 50 mg  50 mg Oral Daily Money, Lowry Ram, FNP   50 mg at 09/16/18 0806  . traZODone (DESYREL) tablet 50 mg  50 mg Oral QHS PRN Money, Lowry Ram, FNP   50 mg at 09/15/18 2114    Lab Results:  Results for orders placed or performed during the hospital encounter of 09/13/18 (from the past 48 hour(s))  Glucose, capillary     Status: Abnormal   Collection Time: 09/14/18  4:38 PM  Result Value Ref Range   Glucose-Capillary 327 (H) 70 - 99 mg/dL   Comment 1 Notify RN   Glucose, capillary     Status: Abnormal   Collection Time: 09/14/18  8:03 PM  Result Value Ref Range   Glucose-Capillary 209 (H) 70 - 99 mg/dL  Glucose, capillary     Status: Abnormal   Collection Time: 09/15/18  5:43 AM  Result Value Ref Range   Glucose-Capillary 171 (H) 70 - 99 mg/dL  Hemoglobin A1c     Status: Abnormal   Collection Time: 09/15/18  6:26 AM  Result Value Ref Range   Hgb A1c MFr Bld 9.5 (H) 4.8 - 5.6 %    Comment: (NOTE) Pre diabetes:          5.7%-6.4% Diabetes:              >6.4% Glycemic control for   <7.0% adults with diabetes    Mean Plasma Glucose 225.95 mg/dL    Comment: Performed at [facility], [place] [address], [place], [place] [address]  Prolactin     Status: None  Collection Time: 09/15/18  6:26 AM  Result Value Ref Range   Prolactin 11.6 4.8 - 23.3 ng/mL    Comment: (NOTE) Performed At: [facility] [place], [place] [id number] Rush Farmer MD [id number]   Lipid panel     Status: Abnormal   Collection Time: 09/15/18  6:26 AM  Result Value Ref Range   Cholesterol 142 0 - 200 mg/dL   Triglycerides 118 <150 mg/dL   HDL 30 (L) >40 mg/dL   Total CHOL/HDL Ratio 4.7 RATIO   VLDL 24 0 - 40 mg/dL   LDL  Cholesterol 88 0 - 99 mg/dL    Comment:        Total Cholesterol/HDL:CHD Risk Coronary Heart Disease Risk Table                     Men   Women  1/2 Average Risk   3.4   3.3  Average Risk       5.0   4.4  2 X Average Risk   9.6   7.1  3 X Average Risk  23.4   11.0        Use the calculated Patient Ratio above and the CHD Risk Table to determine the patient's CHD Risk.        ATP III CLASSIFICATION (LDL):  <100     mg/dL   Optimal  100-129  mg/dL   Near or Above                    Optimal  130-159  mg/dL   Borderline  160-189  mg/dL   High  >190     mg/dL   Very High Performed at [facility] [address] [place], [place] [address]   Glucose, capillary     Status: Abnormal   Collection Time: 09/15/18 12:12 PM  Result Value Ref Range   Glucose-Capillary 268 (H) 70 - 99 mg/dL  Glucose, capillary     Status: Abnormal   Collection Time: 09/15/18  5:13 PM  Result Value Ref Range   Glucose-Capillary 225 (H) 70 - 99 mg/dL  Glucose, capillary     Status: Abnormal   Collection Time: 09/15/18  8:14 PM  Result Value Ref Range   Glucose-Capillary 180 (H) 70 - 99 mg/dL  Glucose, capillary     Status: Abnormal   Collection Time: 09/16/18  6:28 AM  Result Value Ref Range   Glucose-Capillary 173 (H) 70 - 99 mg/dL  Glucose, capillary     Status: Abnormal   Collection Time: 09/16/18 12:12 PM  Result Value Ref Range   Glucose-Capillary 150 (H) 70 - 99 mg/dL    Blood Alcohol level:  Lab Results  Component Value Date   ETH <10 08/25/2017   ETH <5 62/26/3335    Metabolic Disorder Labs: Lab Results  Component Value Date   HGBA1C 9.5 (H) 09/15/2018   MPG 225.95 09/15/2018   MPG 189 (H) 06/10/2013   Lab Results  Component Value Date   PROLACTIN 11.6 09/15/2018   Lab Results  Component Value Date   CHOL 142 09/15/2018   TRIG 118 09/15/2018   HDL 30 (L) 09/15/2018   CHOLHDL 4.7 09/15/2018   VLDL 24 09/15/2018   LDLCALC 88 09/15/2018   LDLCALC 105 (H)  09/14/2018    Physical Findings: AIMS: Facial and Oral Movements Muscles of Facial Expression: None, normal Lips and Perioral Area: None, normal Jaw: None, normal Tongue: None, normal,Extremity  Movements Upper (arms, wrists, hands, fingers): None, normal Lower (legs, knees, ankles, toes): None, normal, Trunk Movements Neck, shoulders, hips: None, normal, Overall Severity Severity of abnormal movements (highest score from questions above): None, normal Incapacitation due to abnormal movements: None, normal Patient's awareness of abnormal movements (rate only patient's report): No Awareness, Dental Status Current problems with teeth and/or dentures?: No Does patient usually wear dentures?: No  CIWA:  CIWA-Ar Total: 0 COWS:  COWS Total Score: 0  Musculoskeletal: Strength & Muscle Tone: within normal limits Gait & Station: normal Patient leans: N/A  Psychiatric Specialty Exam: Physical Exam  ROS  Blood pressure (!) 156/107, pulse (!) 116, temperature 98.2 F (36.8 C), temperature source Oral, resp. rate 20, height _0  (1.626 m), weight 83.9 kg, SpO2 100 %.Body mass index is 31.76 kg/m.  General Appearance: Casual  Eye Contact:  Good  Speech:  Clear and Coherent  Volume:  Normal  Mood:  Euthymic  Affect:  Inappropriate  Thought Process:  Irrelevant and Descriptions of Associations: Loose  Orientation:  Full (Time, Place, and Person)  Thought Content:  Illogical and Delusions  Suicidal Thoughts:  No  Homicidal Thoughts:  No  Memory:  Immediate;   Poor  Judgement:  Impaired  Insight:  Lacking  Psychomotor Activity:  Normal  Concentration:  Concentration: Fair  Recall:  [place] of Knowledge:  Fair  Language:  Fair  Akathisia:  Negative  Handed:  Right  AIMS (if indicated):     Assets:  Resilience Social Support Transportation  ADL's:  Intact  Cognition:  WNL  Sleep:  Number of Hours: 2     Treatment Plan Summary: Daily contact with patient to assess and  evaluate symptoms and progress in treatment, Medication management and Plan Will need a sleep aid we will escalate perphenazine monitor blood pressure still up despite beta-blocker continue reality-based therapies  Shahzain Kiester, MD 09/16/2018, 1:16 PM

## 2018-09-16 NOTE — Progress Notes (Signed)
Recreation Therapy Notes  Date: 6.2.20 Time: 1000 Location: [address] Dayroom  Group Topic: Wellness  Goal Area(s) Addresses:  Patient will define components of whole wellness. Patient will verbalize benefit of whole wellness.  Intervention:  Exercise, Music   Activity:  Exercise.  LRT led group in a series of stretches.  Each group member given the opportunity to lead the group in an exercise of their choosing.  Patients could take breaks and get water as needed.  Education: Wellness, Dentist.   Education Outcome: Acknowledges education/In group clarification offered/Needs additional education.   Clinical Observations/Feedback: Pt did not attend group session.    Victorino Sparrow, LRT/CTRS   Victorino Sparrow A 09/16/2018 11:51 AM

## 2018-09-17 LAB — GLUCOSE, CAPILLARY
Glucose-Capillary: 106 mg/dL — ABNORMAL HIGH (ref 70–99)
Glucose-Capillary: 135 mg/dL — ABNORMAL HIGH (ref 70–99)

## 2018-09-17 MED ORDER — TEMAZEPAM 30 MG PO CAPS: 30.0000 mg | ORAL_CAPSULE | Freq: Every day | ORAL | 0 refills | Status: DC

## 2018-09-17 MED ORDER — PERPHENAZINE 4 MG PO TABS: ORAL_TABLET | ORAL | 2 refills | Status: DC

## 2018-09-17 NOTE — Progress Notes (Signed)
  [facility] Adult Case Management Discharge Plan :  Will you be returning to the same living situation after discharge:  Yes,  home At discharge, do you have transportation home?: Yes,  son is picking up Do you have the ability to pay for your medications: Yes,  Medicare  Release of information consent forms completed and in the chart. Patient to Follow up at: Follow-up Information    [facility] Follow up on 09/22/2018.   Why:  Hospital discharge appointment is Monday, 6/8 at 11:30a. Appointment will be over the phone and the provider will contact you.  Please expect a phone call from office to complete paperwork.  Contact information: [address] [place] [address] [phone]           Next level of care provider has access to [place] and Suicide Prevention discussed: Yes,  with daughter  Have you used any form of tobacco in the last 30 days? (Cigarettes, Smokeless Tobacco, Cigars, and/or Pipes): Yes  Has patient been referred to the Quitline?: Patient refused referral  Patient has been referred for addiction treatment: Yes  Joellen Jersey, [place] 09/17/2018, 11:12 AM

## 2018-09-17 NOTE — Discharge Summary (Signed)
Physician Discharge Summary Note  Patient:  [name] is an 53 y.o., female MRN:  [id number] DOB:  [birth year] Patient phone:  [phone] (home)  Patient address:   [address] [place] [address],  Total Time spent with patient: 15 minutes  Date of Admission:  09/13/2018 Date of Discharge: 09/17/18  Reason for Admission:  Psychotic symptoms  Principal Problem: Bipolar I disorder, most recent episode (or current) manic ([place]) Discharge Diagnoses: Principal Problem:   Bipolar I disorder, most recent episode (or current) manic ([place])   Past Psychiatric History: Patient reported previous inpatient admissions, stated that is she is followed by [facility] and hasn't been in the past 6 month.   Past Medical History:  Past Medical History:  Diagnosis Date  . Alcohol abuse   . Anemia, iron deficiency   . Arteriosclerotic cardiovascular disease (ASCVD)    Minimal at cath in [facility]stress nuclear study in 8/08 with nl EF; neg stress echo in 2010  . Community acquired pneumonia 01/03/10, 05/2010, 04/2012   2011; with pleural effusion-[facility] acute resp failure; intubated in Jan 2014 (HMPV pneumonia)  . Depression   . Diabetes mellitus, type 2 ([place]) 2000   Onset in 2000; no insulin  . Diarrhea 10/14/2013   Started 10/10/13, improved with Imodium.   . Diastolic dysfunction    grade 2 per echo 2011  . Dysphagia   . Gastroesophageal reflux disease    Schatzki's ring  . History of alcohol abuse 07/22/2007   Qualifier: Diagnosis of  By: Lenn Cal    . Hyperlipidemia   . Hypertension `   during treatment with Geodon  . Hypokalemia 12/25/2013  . Left knee pain 08/25/2014  . Obesity   . Oral candidiasis 05/16/2017  . PTSD (post-traumatic stress disorder)   . Pulmonary hypertension ([place]) 05/02/2012   Patient needs repeat echo in 06/2012   . Schizoaffective disorder    requiring multiple psychiatric admissions  . Viral URI 05/12/2013  . Viral wart on finger  10/08/2016  . Vision changes 08/12/2014    Past Surgical History:  Procedure Laterality Date  . COLONOSCOPY  01/2006   internal hemorrhoids  . COLONOSCOPY  01/10/2012   Dr. Rourk:Single anal canal hemorrhoidal tag likely source of  trivial hematochezia; right-sided colonic diverticulosis  . DILATION AND CURETTAGE, DIAGNOSTIC / THERAPEUTIC  1992  . ESOPHAGEAL DILATION N/A 08/18/2015   Procedure: ESOPHAGEAL DILATION;  Surgeon: Daneil Dolin, MD;  Location[facility];  Service: Endoscopy;  Laterality: N/A;  . ESOPHAGOGASTRODUODENOSCOPY  09/16/08   Dr. Trevor Iha hiatal hernia/excoriations involving the cardia and mucosa consistent with trauma, antral erosions  of linear petechiae ? gastritis versus early gastric antral vascular  ectasia.Marland Kitchen biopsy showed reactive gastropathy. No H. pylori.  . ESOPHAGOGASTRODUODENOSCOPY  09/2007   Dr. Evalee Mutton ring, dilated to 37 French Maloney dilator, small hiatal hernia, antral erosions, biopsies reactive gastropathy.  . ESOPHAGOGASTRODUODENOSCOPY (EGD) WITH PROPOFOL N/A 12/17/2013   SKA:JGOTLXB antral erosions and petechiae. Small hiatal hernia. No endoscopic explanation for patient's symptoms  . ESOPHAGOGASTRODUODENOSCOPY (EGD) WITH PROPOFOL N/A 08/18/2015   Procedure: ESOPHAGOGASTRODUODENOSCOPY (EGD) WITH PROPOFOL;  Surgeon: Daneil Dolin, MD;  Location[facility];  Service: Endoscopy;  Laterality: N/A;  0845  . SAVORY DILATION  07/17/2011   Fields-MAC sedation-->distal esophageal stricture s/p dilation, chronic gastritis, multiple ulcers in stomach. no h.pylori   Family History:  Family History  Problem Relation Age of Onset  . Hypertension Mother   . Stroke Father  deceased at age 69  . Colon cancer Other   . Heart disease Sister   . Diabetes Other   . High Cholesterol Other   . Arthritis Other   . Anesthesia problems Neg Hx   . Hypotension Neg Hx   . Malignant hyperthermia Neg Hx   . Pseudochol deficiency Neg Hx    Family  Psychiatric  History: Denies Social History:  Social History   Substance and Sexual Activity  Alcohol Use No  . Alcohol/week: 0.0 standard drinks   Comment: hx of ETOH abuse     Social History   Substance and Sexual Activity  Drug Use No    Social History   Socioeconomic History  . Marital status: Single    Spouse name: Not on file  . Number of children: 2  . Years of education: some colge  . Highest education level: Not on file  Occupational History  . Occupation: Disability  . Occupation: UNEMPLOYED    Employer: UNEMPLOYED  Social Needs  . Financial resource strain: Not on file  . Food insecurity:    Worry: Not on file    Inability: Not on file  . Transportation needs:    Medical: Not on file    Non-medical: Not on file  Tobacco Use  . Smoking status: Current Some Day Smoker    Packs/day: 0.75    Years: 30.00    Pack years: 22.50    Types: Cigarettes    Start date: 05/18/2012    Last attempt to quit: 03/28/2017    Years since quitting: 1.4  . Smokeless tobacco: Never Used  . Tobacco comment: Cutting back   Substance and Sexual Activity  . Alcohol use: No    Alcohol/week: 0.0 standard drinks    Comment: hx of ETOH abuse  . Drug use: No  . Sexual activity: Not Currently    Partners: Male    Birth control/protection: None  Lifestyle  . Physical activity:    Days per week: Not on file    Minutes per session: Not on file  . Stress: Not on file  Relationships  . Social connections:    Talks on phone: Not on file    Gets together: Not on file    Attends religious service: Not on file    Active member of club or organization: Not on file    Attends meetings of clubs or organizations: Not on file    Relationship status: Not on file  Other Topics Concern  . 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 female who presented to [facility] as a walk-in with her family seeking admission to [facility]. Patient states, "I am really tired. I miss my husband.  I don't know where he is and I don't know how long he has been gone.  I cannot take another day without him."  When asked if she was suicidal, patient states, "I could not kill myself if I tried.  If I die, all of creation dies.  If I could just put myself to sleep.  I love my children, but I have been cut off from them."  Patient  states, "My husband is teaching my children to disrespect him, I follow in [place]' war." According to patient's daughter [name] [phone], Patient is normally seen at [facility] in [facility] and her medications are managed by them.  She states that patient has been off her medication for 2 weeks and her behavior has become increasingly bizarre.  Daughter states that patient has never been married.  Patient thinks that she is God's wife and that all the people in the world are her children.  Daughter states that patient can be very religious at times and then cussing everyone out five minutes later.  She states that patient has not been sleeping  Daughter states that patient becomes very impulsive when she is like this and they cannot manage her like she is. Patient has never been suicidal in the past, but has tried to jump out of a moving car when she was in a manic state.  Patient has never done anything to try to harm others.  She was last inpatient behavioral health at [facility] 2 years ago and was seen at [facility] and observed for two days and medications restarted last year. Patient has no history of any drug use and states that she does not drink alcohol.  Patient has only been sleeping on average of four hours per night if that and she has  not been eating as much as usual.  Patient denies having a history of self-mutilation and abuse. Patient denies having any history of legal involvement. Patient was alert and oriented x 3.  Her thoughts were loose and disorganized, but her memory appeared to be intact.  She presented as being delusional and it is suspected that she is responding to bother visual and auditory hallucinations.  Family states that patient is laughing inappropriately.  Her judgment, insight and impulse control are impaired.  Her psycho-motor activity is restless.  Her speech is clear and coherent, but her eye contact is poor.   Ms. Porta was admitted for psychotic behaviors. She had been off of medication for 2 weeks. She was started on perphenazine. Medications for HTN and diabetes were continued. She responded well to treatment with no adverse effects reported. No agitated or disruptive behaviors on the unit. She remained on the [facility] unit for 4 days. She stabilized with medication and therapy. She was discharged on the medications listed below. She has shown improvement with improved mood, affect, sleep, appetite, and interaction. On day of discharge, patient is calm, cooperative, and reports stable mood. Thoughts are more organized. No delusional thoughts are voiced. She denies any SI/HI/AVH and contracts for safety. She agrees to follow up at [facility] (see below). Patient is provided with prescriptions for medications upon discharge. Her family is picking her up for discharge home.  Physical Findings: AIMS: Facial and Oral Movements Muscles of Facial Expression: None, normal Lips and Perioral Area: None, normal Jaw: None, normal Tongue: None, normal,Extremity Movements Upper (arms, wrists, hands, fingers): None, normal Lower (legs, knees, ankles, toes): None, normal, Trunk Movements Neck, shoulders, hips: None, normal, Overall Severity Severity of abnormal movements (highest score from questions above): None,  normal Incapacitation due to abnormal movements: None, normal Patient's awareness of abnormal movements (rate only patient's report): No Awareness, Dental Status Current problems with teeth and/or dentures?: No Does patient usually wear dentures?: No  CIWA:  CIWA-Ar Total: 0 COWS:  COWS Total Score: 0  Musculoskeletal: Strength & Muscle Tone: within normal limits Gait & Station: normal Patient leans: N/A  Psychiatric  Specialty Exam: Physical Exam  Nursing note and vitals reviewed. Constitutional: She is oriented to person, place, and time. She appears well-developed and well-nourished.  Cardiovascular: Normal rate.  Respiratory: Effort normal.  Neurological: She is alert and oriented to person, place, and time.    Review of Systems  Constitutional: Negative.   Respiratory: Negative for cough and shortness of breath.   Cardiovascular: Negative for chest pain.  Gastrointestinal: Negative for nausea and vomiting.  Psychiatric/Behavioral: Negative for depression, hallucinations, substance abuse and suicidal ideas. The patient is not nervous/anxious and does not have insomnia.     Blood pressure (!) 134/94, pulse 95, temperature 98.4 F (36.9 C), temperature source Oral, resp. rate 20, height _0  (1.626 m), weight 83.9 kg, SpO2 100 %.Body mass index is 31.76 kg/m.  General Appearance: Casual  Eye Contact:  Good  Speech:  Normal Rate  Volume:  Normal  Mood:  Euthymic  Affect:  Appropriate and Congruent  Thought Process:  Coherent  Orientation:  Full (Time, Place, and Person)  Thought Content:  Tangential  Suicidal Thoughts:  No  Homicidal Thoughts:  No  Memory:  Immediate;   Fair Recent;   Fair  Judgement:  Intact  Insight:  Fair  Psychomotor Activity:  Normal  Concentration:  Concentration: Fair and Attention Span: Poor  Recall:  [employer] of Knowledge:  Fair  Language:  Good  Akathisia:  No  Handed:  Right  AIMS (if indicated):     Assets:  Communication  Skills Housing Leisure Time Social Support  ADL's:  Intact  Cognition:  WNL  Sleep:  Number of Hours: 6     Have you used any form of tobacco in the last 30 days? (Cigarettes, Smokeless Tobacco, Cigars, and/or Pipes): Yes  Has this patient used any form of tobacco in the last 30 days? (Cigarettes, Smokeless Tobacco, Cigars, and/or Pipes)  Yes, A prescription for an FDA-approved tobacco cessation medication was offered at discharge and the patient refused  Blood Alcohol level:  Lab Results  Component Value Date   [facility] <10 08/25/2017   ETH <5 67/20/9470    Metabolic Disorder Labs:  Lab Results  Component Value Date   HGBA1C 9.5 (H) 09/15/2018   MPG 225.95 09/15/2018   MPG 189 (H) 06/10/2013   Lab Results  Component Value Date   PROLACTIN 11.6 09/15/2018   Lab Results  Component Value Date   CHOL 142 09/15/2018   TRIG 118 09/15/2018   HDL 30 (L) 09/15/2018   CHOLHDL 4.7 09/15/2018   VLDL 24 09/15/2018   LDLCALC 88 09/15/2018   LDLCALC 105 (H) 09/14/2018    See Psychiatric Specialty Exam and Suicide Risk Assessment completed by Attending Physician prior to discharge.  Discharge destination:  Home  Is patient on multiple antipsychotic therapies at discharge:  No   Has Patient had three or more failed trials of antipsychotic monotherapy by history:  No  Recommended Plan for Multiple Antipsychotic Therapies: NA   Allergies as of 09/17/2018      Reactions   Cephalexin    Trouble breathing, felt like she had bumps in her throat.   Metronidazole Shortness Of Breath, Swelling   Orange Itching   Orange Oil    Other Itching, Shortness Of Breath   Takes Benadryl before eating shrimp.   Shrimp [shellfish Allergy] Shortness Of Breath, Itching   Takes Benadryl before eating shrimp.   Penicillins Hives, Swelling   Has patient had a PCN reaction causing immediate rash, facial/tongue/throat swelling,  SOB or lightheadedness with hypotension: Yes Has patient had a PCN  reaction causing severe rash involving mucus membranes or skin necrosis: No Has patient had a PCN reaction that required hospitalization No Has patient had a PCN reaction occurring within the last 10 years: Yes If all of the above answers are "NO", then may proceed with Cephalosporin use. Fever as well   Sulfonamide Derivatives Hives   fever   Glipizide Other (See Comments)   psychosis   Ace Inhibitors Cough   06/2016   Sulfamethoxazole    Sulfamethoxazole-trimethoprim Rash   Trimethoprim       Medication List    STOP taking these medications   ziprasidone 60 MG capsule Commonly known as:  GEODON     TAKE these medications     Indication  Accu-Chek FastClix Lancets Misc Use to check you blood sugar 3 times daily  Indication:  21-Hydroxylase Deficiency   Accu-Chek Guide w/Device Kit 1 Device by Does not apply route 3 (three) times daily. Use to check blood sugars 3 times daily.  Indication:  21-Hydroxylase Deficiency   albuterol 108 (90 Base) MCG/ACT inhaler Commonly known as:  Ventolin HFA Inhale 1-2 puffs into the lungs every 6 (six) hours as needed for wheezing or shortness of breath.  Indication:  Asthma   amLODipine 10 MG tablet Commonly known as:  NORVASC Take 1 tablet (10 mg total) by mouth daily. For high blood pressure  Indication:  High Blood Pressure Disorder   esomeprazole 40 MG capsule Commonly known as:  NEXIUM TAKE 1 CAPSULE(40 MG) BY MOUTH TWICE DAILY BEFORE A MEAL FOR ACID REFLUX  Indication:  Gastroesophageal Reflux Disease   ferrous sulfate 325 (65 FE) MG tablet Take 1 tablet (325 mg total) by mouth every other day. (May buy from over the counter): For anemia  Indication:  Iron Deficiency   glucose blood test strip Commonly known as:  Accu-Chek Guide Use as instructed  Indication:  21-Hydroxylase Deficiency   Insulin Glargine 100 UNIT/ML Solostar Pen Commonly known as:  LANTUS Inject 21 Units into the skin daily.  Indication:   Insulin-Dependent Diabetes   loratadine 10 MG tablet Commonly known as:  CLARITIN Take 1 tablet (10 mg total) by mouth daily. (May buy from over the counter): For allergies  Indication:  Perennial Allergic Rhinitis, Hayfever   losartan 100 MG tablet Commonly known as:  COZAAR Take 1 tablet (100 mg total) by mouth daily. For high blood pressure  Indication:  High Blood Pressure Disorder   lovastatin 40 MG tablet Commonly known as:  Mevacor Take 1 tablet (40 mg total) by mouth daily.  Indication:  Type II A Hyperlipidemia   metFORMIN 1000 MG tablet Commonly known as:  GLUCOPHAGE Take 1 tablet (1,000 mg total) by mouth 2 (two) times daily with a meal. For diabetes management  Indication:  Type 2 Diabetes   metoprolol tartrate 100 MG tablet Commonly known as:  LOPRESSOR Take 1 tablet (100 mg total) by mouth 2 (two) times daily. For high blood pressure  Indication:  High Blood Pressure Disorder   Pen Needles 31G X 5 MM Misc Inject 18 Units into the skin at bedtime. Use to inject insulin daily at bedtime: For diabetes management:  Indication:  Diabetes management   perphenazine 4 MG tablet Commonly known as:  TRILAFON 1 in am 2 at h s  Indication:  Schizophrenia   spironolactone 50 MG tablet Commonly known as:  ALDACTONE Take 1 tablet (50 mg total) by mouth daily.  For high blood pressure  Indication:  High Blood Pressure Disorder   temazepam 30 MG capsule Commonly known as:  RESTORIL Take 1 capsule (30 mg total) by mouth at bedtime.  Indication:  Trouble Sleeping   traZODone 100 MG tablet Commonly known as:  DESYREL Take 1 tablet (100 mg total) by mouth at bedtime as needed for sleep. For sleep  Indication:  Trouble Sleeping      Follow-up Information    Services, [facility] Follow up on 09/22/2018.   Why:  Hospital discharge appointment is Monday, 6/8 at 11:30a. Appointment will be over the phone and the provider will contact you.  Please expect a phone call  from office to complete paperwork.  Contact information: [address] [place] [address] [phone]           Follow-up recommendations: Activity as tolerated. Diet as recommended by primary care physician. Keep all scheduled follow-up appointments as recommended.   Comments: Patient is instructed to take all prescribed medications as recommended. Report any side effects or adverse reactions to your outpatient psychiatrist. Patient is instructed to abstain from alcohol and illegal drugs while on prescription medications. In the event of worsening symptoms, patient is instructed to call the crisis hotline, 911, or go to the nearest emergency department for evaluation and treatment.  Signed: Connye Burkitt, NP 09/17/2018, 11:12 AM

## 2018-09-17 NOTE — Progress Notes (Signed)
Recreation Therapy Notes  Date:  6.3.20 Time: 1000 Location: 500 Hall Dayroom  Group Topic: Stress Management  Goal Area(s) Addresses:  Patient will identify factors that contribute to stress. Patient will identify factors that protect against stress.  Behavioral Response: Engaged  Intervention:  Worksheet, pencils  Activity :  Stress Exploration.  Patients were to identify daily hassles, major life changes and life circumstances.  Patients were to then identify daily uplifts, healthy coping strategies and protective factors.    Education:  Stress Management, Discharge Planning.   Education Outcome: Acknowledges Education  Clinical Observations/Feedback: Pt was quiet but engaged when prompted.  Pt identified daily hassles as "expecting love to get love, expecting equality and expecting no one to repay good with evil".  Pt identified major life changes as separation from loved ones, trying to minister and hope people are saved and oppression from the enemy.  Pt expressed daily uplifts and coping strategies were spending time with family, sharing love, being good to others, going for a walk, encourage self with positive talk, love self and find ways to relax.     Victorino Sparrow, LRT/CTRS    Victorino Sparrow A 09/17/2018 11:31 AM

## 2018-09-17 NOTE — Progress Notes (Signed)
Recreation Therapy Notes  INPATIENT RECREATION TR PLAN  Patient Details Name: [name] MRN: [id number] DOB: 06/14/1965 Today's Date: 09/17/2018  Rec Therapy Plan Is patient appropriate for Therapeutic Recreation?: Yes Treatment times per week: about 3 days Estimated Length of Stay: 5-7 days TR Treatment/Interventions: Group participation (Comment)  Discharge Criteria Pt will be discharged from therapy if:: Discharged Treatment plan/goals/alternatives discussed and agreed upon by:: Patient/family  Discharge Summary Short term goals set: See patient care plan Short term goals met: Adequate for discharge Progress toward goals comments: Groups attended Which groups?: Coping skills, Stress management Reason goals not met: None Therapeutic equipment acquired: N/A Reason patient discharged from therapy: Discharge from hospital Pt/family agrees with progress & goals achieved: Yes Date patient discharged from therapy: 09/17/18     , LRT/CTRS  ,  A 09/17/2018, 12:16 PM  

## 2018-09-17 NOTE — [facility] Suicide Risk Assessment (Signed)
[facility] INPATIENT:  Family/Significant Other Suicide Prevention Education  Suicide Prevention Education:  Education Completed; Cherlyn Cushing, daughter, [phone] has been identified by the patient as the family member/significant other with whom the patient will be residing, and identified as the person(s) who will aid the patient in the event of a mental health crisis (suicidal ideations/suicide attempt).  With written consent from the patient, the family member/significant other has been provided the following suicide prevention education, prior to the and/or following the discharge of the patient.  The suicide prevention education provided includes the following:  Suicide risk factors  Suicide prevention and interventions  National Suicide Hotline telephone number  [facility] assessment telephone number  [facility] Emergency Assistance [place] and/or Residential Mobile Crisis Unit telephone number  Request made of family/significant other to:  Remove weapons (e.g., guns, rifles, knives), all items previously/currently identified as safety concern.    Remove drugs/medications (over-the-counter, prescriptions, illicit drugs), all items previously/currently identified as a safety concern.  The family member/significant other verbalizes understanding of the suicide prevention education information provided.  The family member/significant other agrees to remove the items of safety concern listed above.  Joellen Jersey 09/17/2018, 11:11 AM

## 2018-09-17 NOTE — [facility] Group Notes (Signed)
Pt was invited but did not attend orientation/goals group. 

## 2018-09-17 NOTE — BHH Suicide Risk Assessment (Signed)
[facility] Discharge Suicide Risk Assessment   Principal Problem: Bipolar I disorder, most recent episode (or current) manic ([place]) Discharge Diagnoses: Principal Problem:   Bipolar I disorder, most recent episode (or current) manic ([place])  Currently alert oriented nondelusional denies auditory and visual hallucinations contracting fully and requesting discharge no EPS or TD compliant with meds we discussed long-acting injectable but she is responding to perphenazine so we will continue with that oral meds for now  Total Time spent with patient: 45 minutes Mental Status Per Nursing Assessment::   On Admission:  NA  Demographic Factors:  Unemployed  Loss Factors: NA  Historical Factors: NA  Risk Reduction Factors:   Sense of responsibility to family and Religious beliefs about death  Continued Clinical Symptoms:  Schizophrenia:   Paranoid or undifferentiated type  Cognitive Features That Contribute To Risk:  Loss of executive function    Suicide Risk:  Minimal: No identifiable suicidal ideation.  Patients presenting with no risk factors but with morbid ruminations; may be classified as minimal risk based on the severity of the depressive symptoms  Follow-up Information    Services, [facility] Follow up on 09/22/2018.   Why:  Hospital discharge appointment is Monday, 6/8 at 11:30a. Appointment will be over the phone and the provider will contact you.  Please expect a phone call from office to complete paperwork.  Contact information: [place] [address] [phone]           Plan Of Care/Follow-up recommendations:  Activity:  full  Ahtziry Saathoff, MD 09/17/2018, 10:34 AM

## 2018-09-17 NOTE — Plan of Care (Signed)
Pt was able to identify some coping strategies at conclusion of recreation therapy group sessions.   Victorino Sparrow, LRT/CTRS

## 2018-09-18 ENCOUNTER — Ambulatory Visit: Payer: Self-pay

## 2018-09-22 ENCOUNTER — Telehealth: Payer: Self-pay

## 2018-09-22 NOTE — Telephone Encounter (Signed)
Called patient to see if she would be interested in adding Jardiance to help lower her A1c and provide heart protection. She is going away for a week, so I will give her a call back in a week to start her then.

## 2018-09-26 NOTE — Telephone Encounter (Signed)
Patient also needs TOC med review

## 2018-10-02 ENCOUNTER — Telehealth: Payer: Self-pay

## 2018-10-02 DIAGNOSIS — B3731 Acute candidiasis of vulva and vagina: Secondary | ICD-10-CM

## 2018-10-02 DIAGNOSIS — B373 Candidiasis of vulva and vagina: Secondary | ICD-10-CM

## 2018-10-02 MED ORDER — FLUCONAZOLE 150 MG PO TABS: 150.0000 mg | ORAL_TABLET | Freq: Once | ORAL | 0 refills | Status: AC

## 2018-10-02 NOTE — Telephone Encounter (Signed)
Prescription sent in.  Thank you

## 2018-10-02 NOTE — Telephone Encounter (Signed)
Pt has had fluconazole 150mg  in the past

## 2018-10-02 NOTE — Telephone Encounter (Signed)
Requesting yeast infection med. Please call pt back.

## 2018-10-09 ENCOUNTER — Encounter: Payer: Medicare Other | Admitting: Internal Medicine

## 2018-10-14 ENCOUNTER — Ambulatory Visit: Payer: Medicare Other

## 2018-10-16 ENCOUNTER — Encounter: Payer: Self-pay | Admitting: Internal Medicine

## 2018-10-16 ENCOUNTER — Ambulatory Visit (INDEPENDENT_AMBULATORY_CARE_PROVIDER_SITE_OTHER): Payer: Medicare Other | Admitting: Internal Medicine

## 2018-10-16 ENCOUNTER — Other Ambulatory Visit: Payer: Self-pay

## 2018-10-16 VITALS — BP 133/69 | HR 73 | Temp 98.9°F | Ht 64.0 in | Wt 193.8 lb

## 2018-10-16 DIAGNOSIS — E119 Type 2 diabetes mellitus without complications: Secondary | ICD-10-CM | POA: Diagnosis not present

## 2018-10-16 DIAGNOSIS — Z794 Long term (current) use of insulin: Secondary | ICD-10-CM

## 2018-10-16 DIAGNOSIS — F319 Bipolar disorder, unspecified: Secondary | ICD-10-CM | POA: Diagnosis not present

## 2018-10-16 DIAGNOSIS — G4700 Insomnia, unspecified: Secondary | ICD-10-CM

## 2018-10-16 DIAGNOSIS — I10 Essential (primary) hypertension: Secondary | ICD-10-CM

## 2018-10-16 DIAGNOSIS — F19982 Other psychoactive substance use, unspecified with psychoactive substance-induced sleep disorder: Secondary | ICD-10-CM

## 2018-10-16 DIAGNOSIS — Z79899 Other long term (current) drug therapy: Secondary | ICD-10-CM

## 2018-10-16 MED ORDER — SOLIQUA 100-33 UNT-MCG/ML ~~LOC~~ SOPN: 15.0000 [IU] | PEN_INJECTOR | Freq: Every day | SUBCUTANEOUS | 3 refills | Status: DC

## 2018-10-16 NOTE — Patient Instructions (Addendum)
Thank you for allowing me to provide your care. Today we are making the following changes.  1. STOP the Lantus. START Soliqua 15 units every night.  Our target AM blood surgar is <160. If after 6-7 days your morning sugar is >160 increase the number of units by 2 units (insulin degludec 2 units/liraglutide 0.072 mg) until the desired fasting plasma glucose is achieved.   2. Call you psychiatrist about the Perphenazine and your associate symptoms (insomnia and bizarre dreams). Let me know if they make any medication changes.   I will see you back in 3 months or sooner if any issues arise.

## 2018-10-16 NOTE — Progress Notes (Signed)
   CC: F/u Bipolar, DM, Insomnia and HTN  HPI:  [name] is a 53 y.o. female with PMHx listed below presenting for F/u Bipolar, DM, HTN, and general health maintenance. Please see the A&P for the status of the patient's chronic medical problems.  Past Medical History:  Diagnosis Date  . Alcohol abuse   . Anemia, iron deficiency   . Arteriosclerotic cardiovascular disease (ASCVD)    Minimal at cath in [facility].stress nuclear study in 8/08 with nl EF; neg stress echo in 2010  . Community acquired pneumonia 01/03/10, 05/2010, 04/2012   2011; with pleural effusion-[facility] acute resp failure; intubated in Jan 2014 (HMPV pneumonia)  . Depression   . Diabetes mellitus, type 2 ([place]) 2000   Onset in 2000; no insulin  . Diarrhea 10/14/2013   Started 10/10/13, improved with Imodium.   . Diastolic dysfunction    grade 2 per echo 2011  . Dysphagia   . Gastroesophageal reflux disease    Schatzki's ring  . History of alcohol abuse 07/22/2007   Qualifier: Diagnosis of  By: Lenn Cal    . Hyperlipidemia   . Hypertension `   during treatment with Geodon  . Hypokalemia 12/25/2013  . Left knee pain 08/25/2014  . Obesity   . Oral candidiasis 05/16/2017  . PTSD (post-traumatic stress disorder)   . Pulmonary hypertension ([place]) 05/02/2012   Patient needs repeat echo in 06/2012   . Schizoaffective disorder    requiring multiple psychiatric admissions  . Viral URI 05/12/2013  . Viral wart on finger 10/08/2016  . Vision changes 08/12/2014   Review of Systems: Performed and all others negative.  Physical Exam: Vitals:   10/16/18 1321  BP: 133/69  Pulse: 73  Temp: 98.9 F (37.2 C)  TempSrc: Oral  SpO2: 97%  Weight: 193 lb 12.8 oz (87.9 kg)  Height: 5\' 4"  (1.626 m)   General: Well nourished female in no acute distress Pulm: Good air movement with no wheezing or crackles  CV: RRR, no murmurs, no rubs   Assessment & Plan:   See Encounters Tab for problem based charting.   Patient discussed with Dr. Rebeca Alert

## 2018-10-17 ENCOUNTER — Encounter: Payer: Self-pay | Admitting: Internal Medicine

## 2018-10-17 NOTE — Assessment & Plan Note (Signed)
Well controlled. She is on Amlodipine 10 mg, Losartan 100 mg, Metoprolol tartrate 100 mg BID, and Spironolactone 50 mg QD. She is tolerating these medications without adverse effects. She is not having any issues affording the medications. Denies orthostatic symptoms.   A/P: - Well controlled. Continue Amlodipine 10 mg, Losartan 100 mg, Metoprolol tartrate 100 mg BID, and Spironolactone 50 mg QD

## 2018-10-17 NOTE — Assessment & Plan Note (Addendum)
Uncontrolled DM. Last A1c 9.2. She was previously not taking her insulin or metformin consistently. Since our last visit she states that she has been taking it the majority of days out of the week. She reports that her CBGs are consistently <200 but does not have a meter with her today. She is no longer drinking soda. She is not physically active. Currently on Lantus 21 units QHS and Metformin 1000 mg BID.   A/P: - Will switch Lantus to [facility] with titration instructions. (No personal of FHx of thyroid or pancreatic cancer) - Continue Metformin 1000 mg BID  - She is on a statin and ARB

## 2018-10-17 NOTE — Assessment & Plan Note (Signed)
Reports 3 weeks of difficulty getting and maintaing sleep. She was recently started on a new medications, perphenazine by her psychiatrist. In addition to the new change in sleep pattern she has also been having abnormal/vivid dreams for the past 3 weeks.   She denies nocturia, consumption of caffeine or other stimulants after noon, use of blue light screens in bed.   A/P: - Encouraged the patient to call her psychiatrist to discuss possible drug side effects.

## 2018-10-22 NOTE — Progress Notes (Signed)
Internal Medicine Clinic Attending  Case discussed with Dr. Helberg at the time of the visit.  We reviewed the resident's history and exam and pertinent patient test results.  I agree with the assessment, diagnosis, and plan of care documented in the resident's note.  Alexander Raines, M.D., Ph.D.  

## 2018-10-23 DIAGNOSIS — Z03818 Encounter for observation for suspected exposure to other biological agents ruled out: Secondary | ICD-10-CM | POA: Diagnosis not present

## 2018-11-15 ENCOUNTER — Telehealth: Payer: Self-pay | Admitting: Internal Medicine

## 2018-11-15 DIAGNOSIS — E119 Type 2 diabetes mellitus without complications: Secondary | ICD-10-CM

## 2018-11-15 DIAGNOSIS — Z794 Long term (current) use of insulin: Secondary | ICD-10-CM

## 2018-11-15 MED ORDER — INSULIN GLARGINE 100 UNIT/ML SOLOSTAR PEN: 19.0000 [IU] | PEN_INJECTOR | Freq: Every day | SUBCUTANEOUS | 1 refills | Status: DC

## 2018-11-15 NOTE — Telephone Encounter (Signed)
Patient called the on-call pager with concerns that her insulin was making her blood sugars go too low. She was recently switched to [facility] from lantus. She is nervous to take her insulin because of how lower blood sugars are going. She states that there consistently less than 100. She is requesting to be switched back to Lantus. I will switch over her insulin. She will call if her blood sugars rebounding go too high.

## 2018-11-27 ENCOUNTER — Emergency Department (HOSPITAL_COMMUNITY)
Admission: EM | Admit: 2018-11-27 | Discharge: 2018-12-01 | Disposition: A | Discharge status: Other Acute Inpatient Hospital | Payer: Medicare Other | Attending: Emergency Medicine | Admitting: Emergency Medicine

## 2018-11-27 ENCOUNTER — Encounter (HOSPITAL_COMMUNITY): Payer: Self-pay | Admitting: Emergency Medicine

## 2018-11-27 ENCOUNTER — Other Ambulatory Visit: Payer: Self-pay

## 2018-11-27 DIAGNOSIS — E876 Hypokalemia: Secondary | ICD-10-CM | POA: Diagnosis not present

## 2018-11-27 DIAGNOSIS — F312 Bipolar disorder, current episode manic severe with psychotic features: Secondary | ICD-10-CM | POA: Diagnosis not present

## 2018-11-27 DIAGNOSIS — Z794 Long term (current) use of insulin: Secondary | ICD-10-CM | POA: Diagnosis not present

## 2018-11-27 DIAGNOSIS — F29 Unspecified psychosis not due to a substance or known physiological condition: Secondary | ICD-10-CM

## 2018-11-27 DIAGNOSIS — I110 Hypertensive heart disease with heart failure: Secondary | ICD-10-CM | POA: Diagnosis not present

## 2018-11-27 DIAGNOSIS — F17210 Nicotine dependence, cigarettes, uncomplicated: Secondary | ICD-10-CM | POA: Diagnosis not present

## 2018-11-27 DIAGNOSIS — I5032 Chronic diastolic (congestive) heart failure: Secondary | ICD-10-CM | POA: Insufficient documentation

## 2018-11-27 DIAGNOSIS — E119 Type 2 diabetes mellitus without complications: Secondary | ICD-10-CM | POA: Insufficient documentation

## 2018-11-27 DIAGNOSIS — J449 Chronic obstructive pulmonary disease, unspecified: Secondary | ICD-10-CM | POA: Diagnosis not present

## 2018-11-27 DIAGNOSIS — Z20828 Contact with and (suspected) exposure to other viral communicable diseases: Secondary | ICD-10-CM | POA: Diagnosis not present

## 2018-11-27 DIAGNOSIS — Z79899 Other long term (current) drug therapy: Secondary | ICD-10-CM | POA: Diagnosis not present

## 2018-11-27 LAB — COMPREHENSIVE METABOLIC PANEL
ALT: 11 U/L (ref 0–44)
AST: 11 U/L — ABNORMAL LOW (ref 15–41)
Albumin: 3.9 g/dL (ref 3.5–5.0)
Alkaline Phosphatase: 75 U/L (ref 38–126)
Anion gap: 13 (ref 5–15)
BUN: 9 mg/dL (ref 6–20)
CO2: 22 mmol/L (ref 22–32)
Calcium: 9.8 mg/dL (ref 8.9–10.3)
Chloride: 103 mmol/L (ref 98–111)
Creatinine, Ser: 0.75 mg/dL (ref 0.44–1.00)
GFR calc Af Amer: >60 mL/min (ref >60)
GFR calc non Af Amer: >60 mL/min (ref >60)
Glucose, Bld: 243 mg/dL — ABNORMAL HIGH (ref 70–99)
Potassium: 2.9 mmol/L — ABNORMAL LOW (ref 3.5–5.1)
Sodium: 138 mmol/L (ref 135–145)
Total Bilirubin: 0.4 mg/dL (ref 0.3–1.2)
Total Protein: 6.8 g/dL (ref 6.5–8.1)

## 2018-11-27 LAB — URINALYSIS, ROUTINE W REFLEX MICROSCOPIC
Bilirubin Urine: NEGATIVE
Glucose, UA: 50 mg/dL — AB
Hgb urine dipstick: NEGATIVE
Ketones, ur: 5 mg/dL — AB
Leukocytes,Ua: NEGATIVE
Nitrite: NEGATIVE
Protein, ur: 100 mg/dL — AB
Specific Gravity, Urine: 1.027 (ref 1.005–1.030)
pH: 5 (ref 5.0–8.0)

## 2018-11-27 LAB — ACETAMINOPHEN LEVEL: Acetaminophen (Tylenol), Serum: <10 ug/mL — ABNORMAL LOW (ref 10–30)

## 2018-11-27 LAB — CBC
HCT: 43.4 % (ref 36.0–46.0)
Hemoglobin: 14.3 g/dL (ref 12.0–15.0)
MCH: 27.8 pg (ref 26.0–34.0)
MCHC: 32.9 g/dL (ref 30.0–36.0)
MCV: 84.4 fL (ref 80.0–100.0)
Platelets: 215 10*3/uL (ref 150–400)
RBC: 5.14 MIL/uL — ABNORMAL HIGH (ref 3.87–5.11)
RDW: 14.9 % (ref 11.5–15.5)
WBC: 8.4 10*3/uL (ref 4.0–10.5)
nRBC: 0 % (ref 0.0–0.2)

## 2018-11-27 LAB — RAPID URINE DRUG SCREEN, HOSP PERFORMED
Amphetamines: NOT DETECTED
Barbiturates: NOT DETECTED
Benzodiazepines: POSITIVE — AB
Cocaine: NOT DETECTED
Opiates: NOT DETECTED
Tetrahydrocannabinol: NOT DETECTED

## 2018-11-27 LAB — SALICYLATE LEVEL: Salicylate Lvl: <7 mg/dL (ref 2.8–30.0)

## 2018-11-27 LAB — ETHANOL: Alcohol, Ethyl (B): <10 mg/dL (ref <10)

## 2018-11-27 LAB — MAGNESIUM: Magnesium: 1.2 mg/dL — ABNORMAL LOW (ref 1.7–2.4)

## 2018-11-27 MED ORDER — MAGNESIUM SULFATE 2 GM/50ML IV SOLN
2.0000 g | Freq: Once | INTRAVENOUS | Status: AC
  Administered 2018-11-27: 2 g via INTRAVENOUS
  Filled 2018-11-27: qty 50

## 2018-11-27 MED ORDER — SODIUM CHLORIDE 0.9 % IV BOLUS: 1000.0000 mL | Freq: Once | INTRAVENOUS | Status: DC

## 2018-11-27 MED ORDER — SODIUM CHLORIDE 0.9 % IV BOLUS
1000.0000 mL | Freq: Once | INTRAVENOUS | Status: AC
  Administered 2018-11-27: 1000 mL via INTRAVENOUS

## 2018-11-27 MED ORDER — POTASSIUM CHLORIDE CRYS ER 20 MEQ PO TBCR
40.0000 meq | EXTENDED_RELEASE_TABLET | Freq: Once | ORAL | Status: AC
  Administered 2018-11-27: 22:00:00 40 meq via ORAL
  Filled 2018-11-27: qty 2

## 2018-11-27 NOTE — ED Triage Notes (Signed)
Pt's daughter reports a change to her psych medications in the last few weeks. Daughter reports pt hasn't slept for 3 days. Pt's daughter reports she has been agitated and speaking in another language/accent. Pt talking repetitive, rapid speech. Pt denies SI. Sort nurse reported pt came in saying she was going to kill everyone. Pt states she doesn't remember saying this. Pt states she is agitated that her daughter brought her to the hospital.

## 2018-11-27 NOTE — ED Notes (Signed)
Talked to Pt about changing into [place]. Pt is refusing at this time and stated "get the fuck out". Pt is currently talking to herself in the room but unable to understand the conversation she is having with herself.

## 2018-11-27 NOTE — ED Provider Notes (Signed)
[facility] EMERGENCY DEPARTMENT Provider Note   CSN: [id number] Arrival date & time: 11/27/18  [place]    History   Chief Complaint Chief Complaint  Patient presents with  . Psychiatric Evaluation    HPI [name] is a 53 y.o. female.     The history is provided by the patient, a relative and medical records. No language interpreter was used.   [name] is a 53 y.o. female who presents to the Emergency Department complaining of psychiatric evaluation. Level V caveat due to psychiatric illness. History is provided by the patient and her daughter. Daughter reports that the patient has been acting more confused and agitated. She is speaking in tongues at times and states that she wants to go to sleep. She is also threatening to kill everyone. She also believes that her husband is God. The patient has recently been staying with a daughter and [place], it is unclear if she has been receiving her medications for the last few days. The patient does have a psychiatric history and has needed hospitalizations for similar symptoms in the past. Patient reports feeling poorly and thinks she is dehydrated. She is unsure if she is on her medications. Past Medical History:  Diagnosis Date  . Alcohol abuse   . Anemia, iron deficiency   . Arteriosclerotic cardiovascular disease (ASCVD)    Minimal at cath in [facility].stress nuclear study in 8/08 with nl EF; neg stress echo in 2010  . Community acquired pneumonia 01/03/10, 05/2010, 04/2012   2011; with pleural effusion-[facility] acute resp failure; intubated in Jan 2014 (HMPV pneumonia)  . Depression   . Diabetes mellitus, type 2 ([place]) 2000   Onset in 2000; no insulin  . Diarrhea 10/14/2013   Started 10/10/13, improved with Imodium.   . Diastolic dysfunction    grade 2 per echo 2011  . Dysphagia   . Gastroesophageal reflux disease    Schatzki's ring  . History of alcohol abuse 07/22/2007   Qualifier:  Diagnosis of  By: Lenn Cal    . Hyperlipidemia   . Hypertension `   during treatment with Geodon  . Hypokalemia 12/25/2013  . Left knee pain 08/25/2014  . Obesity   . Oral candidiasis 05/16/2017  . PTSD (post-traumatic stress disorder)   . Pulmonary hypertension ([place]) 05/02/2012   Patient needs repeat echo in 06/2012   . Schizoaffective disorder    requiring multiple psychiatric admissions  . Viral URI 05/12/2013  . Viral wart on finger 10/08/2016  . Vision changes 08/12/2014    Patient Active Problem List   Diagnosis Date Noted  . Bipolar I disorder, most recent episode (or current) manic ([place]) 09/14/2018  . Schizoaffective disorder, bipolar type ([place]) 08/26/2017  . Immunity status testing 04/17/2017  . Encounter for health education 10/24/2014  . IIH (idiopathic intracranial hypertension) 08/12/2014  . Seasonal allergies 02/23/2014  . Daytime hypersomnolence 12/09/2012  . Insomnia 11/06/2012  . COPD (chronic obstructive pulmonary disease) ([place]) 10/20/2012  . GERD (gastroesophageal reflux disease) 10/15/2012  . Preventative health care 10/15/2012  . Pulmonary hypertension ([place]) 05/02/2012  . Diastolic heart failure ([place]) 02/07/2012  . IDA (iron deficiency anemia) 11/06/2011  . Fatigue 05/17/2011  . Arteriosclerotic cardiovascular disease (ASCVD)   . TOBACCO ABUSE 09/27/2009  . Hyperlipidemia 07/22/2007  . Obesity 07/22/2007  . Bipolar 1 disorder ([place]) 07/22/2007  . Resistant hypertension 07/22/2007  . Diabetes mellitus type 2, controlled ([place]) 09/28/2002    Past Surgical History:  Procedure  Laterality Date  . COLONOSCOPY  01/2006   internal hemorrhoids  . COLONOSCOPY  01/10/2012   Dr. Rourk:Single anal canal hemorrhoidal tag likely source of  trivial hematochezia; right-sided colonic diverticulosis  . DILATION AND CURETTAGE, DIAGNOSTIC / THERAPEUTIC  1992  . ESOPHAGEAL DILATION N/A 08/18/2015   Procedure: ESOPHAGEAL DILATION;  Surgeon: Daneil Dolin, MD;  Location: [facility]  ENDO SUITE;  Service: Endoscopy;  Laterality: N/A;  . ESOPHAGOGASTRODUODENOSCOPY  09/16/08   Dr. Trevor Iha hiatal hernia/excoriations involving the cardia and mucosa consistent with trauma, antral erosions  of linear petechiae ? gastritis versus early gastric antral vascular  ectasia.Marland Kitchen biopsy showed reactive gastropathy. No H. pylori.  . ESOPHAGOGASTRODUODENOSCOPY  09/2007   Dr. Evalee Mutton ring, dilated to 80 French Maloney dilator, small hiatal hernia, antral erosions, biopsies reactive gastropathy.  . ESOPHAGOGASTRODUODENOSCOPY (EGD) WITH PROPOFOL N/A 12/17/2013   FUX:NATFTDD antral erosions and petechiae. Small hiatal hernia. No endoscopic explanation for patient's symptoms  . ESOPHAGOGASTRODUODENOSCOPY (EGD) WITH PROPOFOL N/A 08/18/2015   Procedure: ESOPHAGOGASTRODUODENOSCOPY (EGD) WITH PROPOFOL;  Surgeon: Daneil Dolin, MD;  Location: [facility] ENDO SUITE;  Service: Endoscopy;  Laterality: N/A;  0845  . SAVORY DILATION  07/17/2011   [facility] sedation-->distal esophageal stricture s/p dilation, chronic gastritis, multiple ulcers in stomach. no h.pylori     OB History    Gravida  3   Para  2   Term  2   Preterm      AB  1   Living  2     SAB      TAB  1   Ectopic      Multiple      Live Births  2            Home Medications    Prior to Admission medications   Medication Sig Start Date End Date Taking? Authorizing Provider  Accu-Chek FastClix Lancets MISC Use to check you blood sugar 3 times daily 07/29/18   Helberg, Larkin Ina, MD  albuterol (VENTOLIN HFA) 108 (90 Base) MCG/ACT inhaler Inhale 1-2 puffs into the lungs every 6 (six) hours as needed for wheezing or shortness of breath. 09/24/17   Axel Filler, MD  amLODipine (NORVASC) 10 MG tablet Take 1 tablet (10 mg total) by mouth daily. For high blood pressure 09/24/17 11/27/18  Axel Filler, MD  Blood Glucose Monitoring Suppl (ACCU-CHEK GUIDE) w/Device KIT 1 Device by Does not apply route 3 (three)  times daily. Use to check blood sugars 3 times daily. 07/29/18   Helberg, Larkin Ina, MD  esomeprazole (NEXIUM) 40 MG capsule TAKE 1 CAPSULE(40 MG) BY MOUTH TWICE DAILY BEFORE A MEAL FOR ACID REFLUX 08/06/18   Ina Homes, MD  ferrous sulfate 325 (65 FE) MG tablet Take 1 tablet (325 mg total) by mouth every other day. (May buy from over the counter): For anemia 09/24/17   Axel Filler, MD  glucose blood (ACCU-CHEK GUIDE) test strip Use as instructed 07/29/18   Ina Homes, MD  Insulin Glargine (LANTUS) 100 UNIT/ML Solostar Pen Inject 19 Units into the skin daily. 11/15/18   Helberg, Larkin Ina, MD  Insulin Pen Needle (PEN NEEDLES) 31G X 5 MM MISC Inject 18 Units into the skin at bedtime. Use to inject insulin daily at bedtime: For diabetes management: 06/12/18   Ina Homes, MD  loratadine (CLARITIN) 10 MG tablet Take 1 tablet (10 mg total) by mouth daily. (May buy from over the counter): For allergies 07/25/18   Neva Seat, MD  losartan (COZAAR) 100 MG tablet Take  1 tablet (100 mg total) by mouth daily. For high blood pressure 09/04/18   Helberg, Larkin Ina, MD  lovastatin (MEVACOR) 40 MG tablet Take 1 tablet (40 mg total) by mouth daily. 09/04/18 12/03/18  Ina Homes, MD  metFORMIN (GLUCOPHAGE) 1000 MG tablet Take 1 tablet (1,000 mg total) by mouth 2 (two) times daily with a meal. For diabetes management 09/04/18   Ina Homes, MD  metoprolol tartrate (LOPRESSOR) 100 MG tablet Take 1 tablet (100 mg total) by mouth 2 (two) times daily. For high blood pressure 09/04/18   Ina Homes, MD  perphenazine (TRILAFON) 4 MG tablet 1 in am 2 at h s 09/17/18   Johnn Hai, MD  spironolactone (ALDACTONE) 50 MG tablet Take 1 tablet (50 mg total) by mouth daily. For high blood pressure 09/04/18   Helberg, Larkin Ina, MD  temazepam (RESTORIL) 30 MG capsule Take 1 capsule (30 mg total) by mouth at bedtime. 09/17/18   Johnn Hai, MD  traZODone (DESYREL) 100 MG tablet Take 1 tablet (100 mg total) by mouth at  bedtime as needed for sleep. For sleep 09/24/17   Axel Filler, MD    Family History Family History  Problem Relation Age of Onset  . Hypertension Mother   . Stroke Father        deceased at age 43  . Colon cancer Other   . Heart disease Sister   . Diabetes Other   . High Cholesterol Other   . Arthritis Other   . Anesthesia problems Neg Hx   . Hypotension Neg Hx   . Malignant hyperthermia Neg Hx   . Pseudochol deficiency Neg Hx     Social History Social History   Tobacco Use  . Smoking status: Current Some Day Smoker    Packs/day: 0.75    Years: 30.00    Pack years: 22.50    Types: Cigarettes    Start date: 05/18/2012    Last attempt to quit: 03/28/2017    Years since quitting: 1.6  . Smokeless tobacco: Never Used  . Tobacco comment: Cutting back   Substance Use Topics  . Alcohol use: No    Alcohol/week: 0.0 standard drinks    Comment: hx of ETOH abuse  . Drug use: No     Allergies   Cephalexin, Metronidazole, Orange, Orange oil, Other, Shrimp [shellfish allergy], Penicillins, Sulfonamide derivatives, Glipizide, Ace inhibitors, Sulfamethoxazole, Sulfamethoxazole-trimethoprim, and Trimethoprim   Review of Systems Review of Systems  All other systems reviewed and are negative.    Physical Exam Updated Vital Signs BP (!) 145/78   Pulse 95   Temp 99.1 F (37.3 C) (Oral)   Resp 16   SpO2 100%   Physical Exam Vitals signs and nursing note reviewed.  Constitutional:      Appearance: She is well-developed.  HENT:     Head: Normocephalic and atraumatic.  Cardiovascular:     Rate and Rhythm: Normal rate and regular rhythm.     Heart sounds: No murmur.  Pulmonary:     Effort: Pulmonary effort is normal. No respiratory distress.     Breath sounds: Normal breath sounds.  Abdominal:     Palpations: Abdomen is soft.     Tenderness: There is no abdominal tenderness. There is no guarding or rebound.  Musculoskeletal:        General: No tenderness.   Skin:    General: Skin is warm and dry.  Neurological:     Mental Status: She is alert and oriented to person, place,  and time.     Comments: Moves all extremities symmetrically.  Psychiatric:     Comments: Flat affect.      ED Treatments / Results  Labs (all labs ordered are listed, but only abnormal results are displayed) Labs Reviewed  COMPREHENSIVE METABOLIC PANEL - Abnormal; Notable for the following components:      Result Value   Potassium 2.9 (*)    Glucose, Bld 243 (*)    AST 11 (*)    All other components within normal limits  ACETAMINOPHEN LEVEL - Abnormal; Notable for the following components:   Acetaminophen (Tylenol), Serum <10 (*)    All other components within normal limits  CBC - Abnormal; Notable for the following components:   RBC 5.14 (*)    All other components within normal limits  RAPID URINE DRUG SCREEN, HOSP PERFORMED - Abnormal; Notable for the following components:   Benzodiazepines POSITIVE (*)    All other components within normal limits  URINALYSIS, ROUTINE W REFLEX MICROSCOPIC - Abnormal; Notable for the following components:   APPearance HAZY (*)    Glucose, UA 50 (*)    Ketones, ur 5 (*)    Protein, ur 100 (*)    Bacteria, UA RARE (*)    All other components within normal limits  MAGNESIUM - Abnormal; Notable for the following components:   Magnesium 1.2 (*)    All other components within normal limits  SARS CORONAVIRUS 2 (HOSPITAL ORDER, [place] LAB)  ETHANOL  SALICYLATE LEVEL    EKG None  Radiology No results found.  Procedures Procedures (including critical care time)  Medications Ordered in ED Medications  potassium chloride SA (K-DUR) CR tablet 40 mEq (40 mEq Oral Given 11/27/18 2155)  magnesium sulfate IVPB 2 g 50 mL (2 g Intravenous New Bag/Given 11/27/18 2246)  sodium chloride 0.9 % bolus 1,000 mL (1,000 mLs Intravenous New Bag/Given 11/27/18 2242)     Initial Impression / Assessment and  Plan / ED Course  I have reviewed the triage vital signs and the nursing notes.  Pertinent labs & imaging results that were available during my care of the patient were reviewed by me and considered in my medical decision making (see chart for details).       Patient here for evaluation of behavioral change, SI and HI. She is psychotic but redirected bowl on evaluation in the emergency department. She does have hypokalemia, hypoglycemia, which was repeated. She has been medically cleared for psychiatric evaluation and treatment. IVC completed for patient safety.  Patient care transferred pending patient placement.   Final Clinical Impressions(s) / ED Diagnoses   Final diagnoses:  None    ED Discharge Orders    None       Quintella Reichert, MD 11/28/18 Benancio Deeds

## 2018-11-27 NOTE — ED Notes (Signed)
Pt's sister would like to be called for updates [place]
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
## 2018-11-28 DIAGNOSIS — F312 Bipolar disorder, current episode manic severe with psychotic features: Secondary | ICD-10-CM | POA: Diagnosis not present

## 2018-11-28 LAB — SARS CORONAVIRUS 2 BY RT PCR (HOSPITAL ORDER, PERFORMED IN ~~LOC~~ HOSPITAL LAB): SARS Coronavirus 2: NEGATIVE

## 2018-11-28 LAB — CBG MONITORING, ED
Glucose-Capillary: 311 mg/dL — ABNORMAL HIGH (ref 70–99)
Glucose-Capillary: 269 mg/dL — ABNORMAL HIGH (ref 70–99)
Glucose-Capillary: 299 mg/dL — ABNORMAL HIGH (ref 70–99)
Glucose-Capillary: 350 mg/dL — ABNORMAL HIGH (ref 70–99)
Glucose-Capillary: 151 mg/dL — ABNORMAL HIGH (ref 70–99)

## 2018-11-28 LAB — MAGNESIUM: Magnesium: 1.7 mg/dL (ref 1.7–2.4)

## 2018-11-28 LAB — BASIC METABOLIC PANEL
Anion gap: 12 (ref 5–15)
BUN: 6 mg/dL (ref 6–20)
CO2: 23 mmol/L (ref 22–32)
Calcium: 9.5 mg/dL (ref 8.9–10.3)
Chloride: 103 mmol/L (ref 98–111)
Creatinine, Ser: 0.81 mg/dL (ref 0.44–1.00)
GFR calc Af Amer: >60 mL/min (ref >60)
GFR calc non Af Amer: >60 mL/min (ref >60)
Glucose, Bld: 333 mg/dL — ABNORMAL HIGH (ref 70–99)
Potassium: 3.9 mmol/L (ref 3.5–5.1)
Sodium: 138 mmol/L (ref 135–145)

## 2018-11-28 MED ORDER — INSULIN ASPART 100 UNIT/ML ~~LOC~~ SOLN
0.0000 [IU] | Freq: Every day | SUBCUTANEOUS | Status: DC
  Administered 2018-11-28: 4 [IU] via SUBCUTANEOUS

## 2018-11-28 MED ORDER — PERPHENAZINE 4 MG PO TABS
4.0000 mg | ORAL_TABLET | Freq: Every day | ORAL | Status: DC
  Administered 2018-11-28 – 2018-12-01 (×4): 4 mg via ORAL
  Filled 2018-11-28 (×5): qty 1

## 2018-11-28 MED ORDER — ONDANSETRON HCL 4 MG PO TABS: 4.0000 mg | ORAL_TABLET | Freq: Three times a day (TID) | ORAL | Status: DC | PRN

## 2018-11-28 MED ORDER — POTASSIUM CHLORIDE CRYS ER 20 MEQ PO TBCR
40.0000 meq | EXTENDED_RELEASE_TABLET | Freq: Once | ORAL | Status: AC
  Administered 2018-11-28: 40 meq via ORAL
  Filled 2018-11-28: qty 2

## 2018-11-28 MED ORDER — TEMAZEPAM 15 MG PO CAPS
30.0000 mg | ORAL_CAPSULE | Freq: Every day | ORAL | Status: DC
  Administered 2018-11-28 – 2018-11-30 (×3): 30 mg via ORAL
  Filled 2018-11-28 (×3): qty 2

## 2018-11-28 MED ORDER — LORATADINE 10 MG PO TABS
10.0000 mg | ORAL_TABLET | Freq: Every day | ORAL | Status: DC
  Administered 2018-11-28 – 2018-11-30 (×3): 10 mg via ORAL
  Filled 2018-11-28 (×3): qty 1

## 2018-11-28 MED ORDER — ALUM & MAG HYDROXIDE-SIMETH 200-200-20 MG/5ML PO SUSP: 30.0000 mL | Freq: Four times a day (QID) | ORAL | Status: DC | PRN

## 2018-11-28 MED ORDER — ALBUTEROL SULFATE HFA 108 (90 BASE) MCG/ACT IN AERS: 1.0000 | INHALATION_SPRAY | Freq: Four times a day (QID) | RESPIRATORY_TRACT | Status: DC | PRN

## 2018-11-28 MED ORDER — PERPHENAZINE 4 MG PO TABS
8.0000 mg | ORAL_TABLET | Freq: Every day | ORAL | Status: DC
  Administered 2018-11-29 – 2018-11-30 (×2): 8 mg via ORAL
  Filled 2018-11-28 (×2): qty 2
  Filled 2018-11-28: qty 1
  Filled 2018-11-28: qty 2

## 2018-11-28 MED ORDER — ZIPRASIDONE MESYLATE 20 MG IM SOLR
20.0000 mg | Freq: Once | INTRAMUSCULAR | Status: AC
  Administered 2018-11-28: 20 mg via INTRAMUSCULAR
  Filled 2018-11-28: qty 20

## 2018-11-28 MED ORDER — AMLODIPINE BESYLATE 5 MG PO TABS
10.0000 mg | ORAL_TABLET | Freq: Every day | ORAL | Status: DC
  Administered 2018-11-28 – 2018-11-30 (×3): 10 mg via ORAL
  Filled 2018-11-28 (×3): qty 2

## 2018-11-28 MED ORDER — METFORMIN HCL 500 MG PO TABS
1000.0000 mg | ORAL_TABLET | Freq: Two times a day (BID) | ORAL | Status: DC
  Administered 2018-11-28 – 2018-12-01 (×6): 1000 mg via ORAL
  Filled 2018-11-28 (×6): qty 2

## 2018-11-28 MED ORDER — INSULIN GLARGINE 100 UNIT/ML ~~LOC~~ SOLN
19.0000 [IU] | Freq: Every day | SUBCUTANEOUS | Status: DC
  Administered 2018-11-28: 19 [IU] via SUBCUTANEOUS
  Filled 2018-11-28: qty 0.19

## 2018-11-28 MED ORDER — INSULIN ASPART 100 UNIT/ML ~~LOC~~ SOLN: 6.0000 [IU] | Freq: Three times a day (TID) | SUBCUTANEOUS | Status: DC

## 2018-11-28 MED ORDER — LOSARTAN POTASSIUM 50 MG PO TABS
100.0000 mg | ORAL_TABLET | Freq: Every day | ORAL | Status: DC
  Administered 2018-11-28 – 2018-11-30 (×3): 100 mg via ORAL
  Filled 2018-11-28 (×3): qty 2

## 2018-11-28 MED ORDER — STERILE WATER FOR INJECTION IJ SOLN
INTRAMUSCULAR | Status: AC
  Administered 2018-11-28: 1.2 mL
  Filled 2018-11-28: qty 10

## 2018-11-28 MED ORDER — SPIRONOLACTONE 25 MG PO TABS
50.0000 mg | ORAL_TABLET | Freq: Every day | ORAL | Status: DC
  Administered 2018-11-28 – 2018-11-30 (×3): 50 mg via ORAL
  Filled 2018-11-28 (×3): qty 2

## 2018-11-28 MED ORDER — METOPROLOL TARTRATE 25 MG PO TABS
100.0000 mg | ORAL_TABLET | Freq: Two times a day (BID) | ORAL | Status: DC
  Administered 2018-11-28 – 2018-11-30 (×6): 100 mg via ORAL
  Filled 2018-11-28 (×6): qty 4

## 2018-11-28 MED ORDER — INSULIN ASPART 100 UNIT/ML ~~LOC~~ SOLN
0.0000 [IU] | Freq: Three times a day (TID) | SUBCUTANEOUS | Status: DC
  Administered 2018-11-28: 11 [IU] via SUBCUTANEOUS
  Administered 2018-11-29: 4 [IU] via SUBCUTANEOUS
  Administered 2018-11-29 – 2018-11-30 (×3): 7 [IU] via SUBCUTANEOUS
  Administered 2018-12-01: 4 [IU] via SUBCUTANEOUS

## 2018-11-28 NOTE — ED Notes (Signed)
Dinner tray called

## 2018-11-28 NOTE — Progress Notes (Signed)
CSW spoke with patient's son [name] at [phone] regarding his questions about pursuing guardianship of this patient. CSW thoroughly explained the guardianship processed and how to pursue it and answered all of [name]'s questions. [name] reports that the patient receives outpatient mental health services at [facility] in [place] but that the services are inadequate. CSW also provided information on [facility] outpatient mental health services that are offered in [place] with the contact information. [name] stated understanding and expressed gratitude for CSW conversation.  Madilyn Fireman, MSW, LCSW-A Clinical Social Worker [facility] [place] [facility] [phone]

## 2018-11-28 NOTE — ED Notes (Signed)
Pt's BP is elevated and EDP notified before giving Geodon; Patient took BP meds prior to Geodon given; For the safety of staff RN administered Geodon despite increased BP with EDP's guidance; Pt is now in room with flight of ideas and speaking loudly; NT was able to redirect patient to lie down in the bed;-Sondi,RN

## 2018-11-28 NOTE — ED Notes (Signed)
Pt's son [name] is requesting Pt to receive care at the [facility] in [place]. Also, son wants to establish legal guardian of Pt

## 2018-11-28 NOTE — ED Notes (Signed)
The food  Is terrible looking and small portions  Pt wants to go to panera unable to leave here

## 2018-11-28 NOTE — ED Notes (Signed)
Pt at the desk reporting that she is ready to leave  She reports also that she needs to see a doctor which she has not seen for awhile

## 2018-11-28 NOTE — ED Notes (Signed)
The pt has been talking to herself all day according to the sitter  She gets active and loud intermittently

## 2018-11-28 NOTE — ED Provider Notes (Signed)
Pt labs, vitals, and RN notes reviewed.  Pt here for homicidal ideations, confusion, and agitation.  During initial medical screening, patient was hypokalemic with hypomagnesemia.  Electrolytes replenished.  Patient currently pending placement for inpatient treatment.  Will recheck electrolytes today.  Potassium and magnesium improved.  Patient remains hyperglycemic.  Home meds ordered, and sliding scale started.  On evaluation, pt sleeping calmly.    Franchot Heidelberg, PA-C 11/28/18 1457    Hayden Rasmussen, MD 11/29/18 [phone]
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
## 2018-11-28 NOTE — ED Notes (Signed)
The pt is upset when she was told she had already had her  Phone calls for today angry yelling loudly

## 2018-11-28 NOTE — ED Notes (Signed)
Pt had 2 phone calls for today

## 2018-11-29 DIAGNOSIS — F312 Bipolar disorder, current episode manic severe with psychotic features: Secondary | ICD-10-CM | POA: Diagnosis not present

## 2018-11-29 LAB — CBG MONITORING, ED
Glucose-Capillary: 180 mg/dL — ABNORMAL HIGH (ref 70–99)
Glucose-Capillary: 228 mg/dL — ABNORMAL HIGH (ref 70–99)
Glucose-Capillary: 155 mg/dL — ABNORMAL HIGH (ref 70–99)
Glucose-Capillary: 191 mg/dL — ABNORMAL HIGH (ref 70–99)

## 2018-11-29 MED ORDER — ZIPRASIDONE MESYLATE 20 MG IM SOLR
20.0000 mg | INTRAMUSCULAR | Status: AC | PRN
  Administered 2018-11-29: 20 mg via INTRAMUSCULAR
  Filled 2018-11-29: qty 20

## 2018-11-29 MED ORDER — LORAZEPAM 1 MG PO TABS
1.0000 mg | ORAL_TABLET | ORAL | Status: AC | PRN
  Administered 2018-11-29: 1 mg via ORAL
  Filled 2018-11-29: qty 1

## 2018-11-29 MED ORDER — STERILE WATER FOR INJECTION IJ SOLN
INTRAMUSCULAR | Status: AC
  Administered 2018-11-29: 1.2 mL
  Filled 2018-11-29: qty 10

## 2018-11-29 MED ORDER — OLANZAPINE 5 MG PO TBDP
10.0000 mg | ORAL_TABLET | Freq: Three times a day (TID) | ORAL | Status: DC | PRN
  Administered 2018-11-29 – 2018-11-30 (×4): 10 mg via ORAL
  Filled 2018-11-29 (×5): qty 2

## 2018-11-29 NOTE — ED Notes (Signed)
Pt on phone at nurses' desk. Sitter w/pt.

## 2018-11-29 NOTE — ED Notes (Signed)
Sitting in chair in doorway of room.

## 2018-11-29 NOTE — ED Notes (Addendum)
Pt noted to be restless - attempting to ambulate in hallway. Pt returns to room as directed. Pt asking to be d/c'd. Advised pt [facility] is seeking Inpt Tx - states she does not need it. Pt took meds after encouragement. Pt stated she is being given med to make her paranoid and "tell Ronalee Belts not to be giving me that". Pt appears delusional. Pt attempted to make phone call earlier - no answer. Pt stating she is being held here against her will.

## 2018-11-29 NOTE — ED Notes (Signed)
Re-TTS performed.

## 2018-11-29 NOTE — ED Notes (Signed)
RN attempted to give pt oral PRN; pt refused and attempted to walk out of the unit; Sitter and RN able to to redirect patient back to room but patient continues to verbally aggressive and states she is not staying in "this dirty hospital". Patient is demanding to see her children and keep referencing her son "[name]". Patient is sitting on her bed now weeping about not being able to see her children; RN has PRN IM med on stand by-Tyja,RN

## 2018-11-29 NOTE — ED Notes (Signed)
Patient made her 1st phone call. 

## 2018-11-29 NOTE — ED Notes (Signed)
Patient was given a [place] Midwife. A Regular Diet was ordered for Dinner.

## 2018-11-29 NOTE — ED Notes (Addendum)
Pt ambulatory to nurses' desk asking if she can make an extra phone call. Advised pt of 2-phone calls/day. Voiced understanding and returned to room as directed.

## 2018-11-29 NOTE — ED Notes (Signed)
Patient used her 2nd phone call to talk to Daughter.

## 2018-11-29 NOTE — ED Notes (Signed)
Regular Diet was ordered for Lunch. 

## 2018-11-29 NOTE — ED Notes (Addendum)
Patient will not stay in her room and continues to try to leave unit; patient now sitting in a chair in the hallway; pt is non complainant with wearing her mask while in common areas;-Mikah,RN
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
## 2018-11-29 NOTE — ED Notes (Signed)
Patient at the desk making 1 last phone call for the night-[place],RN

## 2018-11-29 NOTE — ED Notes (Addendum)
Pt has only eaten few bites for lunch. States she does not feel hungry. Pt laid down on bed - stating she is going to take a nap.

## 2018-11-29 NOTE — ED Notes (Signed)
Pt declining to eat dinner.

## 2018-11-29 NOTE — ED Notes (Signed)
Pt and daughter aware pt may have 2 phone calls and this is her 2nd and last for the day.

## 2018-11-29 NOTE — ED Notes (Signed)
Patient was given Sprite and sugar free cookies.

## 2018-11-29 NOTE — ED Notes (Signed)
Zyprexa given d/t pt noted to continue w/restlessness, attempting to ambulate in hallway, and sit in chair in hallway.

## 2018-11-30 ENCOUNTER — Encounter (HOSPITAL_COMMUNITY): Payer: Self-pay | Admitting: Registered Nurse

## 2018-11-30 DIAGNOSIS — F312 Bipolar disorder, current episode manic severe with psychotic features: Secondary | ICD-10-CM | POA: Diagnosis not present

## 2018-11-30 LAB — CBG MONITORING, ED
Glucose-Capillary: 226 mg/dL — ABNORMAL HIGH (ref 70–99)
Glucose-Capillary: 210 mg/dL — ABNORMAL HIGH (ref 70–99)
Glucose-Capillary: 81 mg/dL (ref 70–99)
Glucose-Capillary: 264 mg/dL — ABNORMAL HIGH (ref 70–99)

## 2018-11-30 MED ORDER — INSULIN ASPART 100 UNIT/ML ~~LOC~~ SOLN
0.0000 [IU] | Freq: Every day | SUBCUTANEOUS | Status: DC
  Administered 2018-11-30: 3 [IU] via SUBCUTANEOUS

## 2018-11-30 NOTE — ED Notes (Signed)
Pt given decaf coffee per Jacqlyn Larsen, RN.

## 2018-11-30 NOTE — ED Notes (Addendum)
Pt ate breakfast. Pt noted to be upset d/t [facility] continuing to seek Inpt Placement. States she feels much better and believes she can go home but agrees to stay. Aware is under IVC. States she spoke w/her son on the phone and became upset w/him d/t he did not agree for her to be d/c'd to home and him monitor her meds. States she hung up on him and "wasted one of my 2 phone calls". Pt then states "he knows I am not a science project and shouldn't be treated like one" - referring to staff member from a couple of days ago. Paranoia noted to continue.

## 2018-11-30 NOTE — ED Notes (Signed)
Per [place], [place] pt has been accepted - Dr Leanna Sato - Report # [id number]. Aware will plan for pt to arrive tomorrow d/t being transported by [facility] Deputy. Left message for [facility] Deputy notifying of need for transport.

## 2018-11-30 NOTE — Progress Notes (Signed)
Patient meets criteria for inpatient treatment. No appropriate or available beds at [facility]. CSW faxed referrals to the following facilities for review:  [facility]  TTS will continue to seek bed placement.  Chalmers Guest. Guerry Bruin, MSW, [place] Work/Disposition Phone: [phone] Fax: [phone]

## 2018-11-30 NOTE — ED Notes (Addendum)
Pt asking for graham crackers and regular soda d/t does not like diet sodas - given graham crackers and Sprite Zero.

## 2018-11-30 NOTE — [facility] Counselor (Signed)
  REASSESSMENT  [place] reevaluated pt for safety and stability.  Pt was observed sitting up in the bed, alert, cooperative, and oriented x 3.  Pt denies SI/HI/A/V-hallucinations.  Pt states "I'm doing a lot better.  I think they been messing up my medicine.  Because if they give me my medicine I stay up and I don't go to sleep.  When I fix my medicines I usually go to sleep.  Next time I'm going to telll the nurse to bring my medicine in here and let me fix it."   Pt was hyper-verbal during the assessment and became upset when discussion of inpatient placement was recommended.  Pt stated "I need to go home. I can't stay here another night.  I want to speak to the doctor."  [facility] will inform [facility] Provider of pt's request.  Patti Shorb L. [place], [place], [facility], [facility] Therapeutic Triage Specialist  [phone]

## 2018-11-30 NOTE — [facility] Counselor (Signed)
  Reassessment Disposition  [facility] discussed case with [place] Provider, Earleen [place], NP who continues to recommend inpatient treatment. TTS will continue to look for inpatient placement.  Taneasha Fuqua L. [place], [place], [facility], [facility] Therapeutic Triage Specialist  [phone]

## 2018-11-30 NOTE — ED Provider Notes (Signed)
ED psychiatric rounding note  53 year old female who is been in this emergency department for over 63 hours at this time.  Patient presented with confusion and agitation, abnormal behavior here for psychiatric evaluation with suicidal and homicidal ideations.  Patient noted to be hypokalemic and hypomagnesemic on presentation, she was repleted, subsequently medically cleared for psychiatric evaluation, currently under IVC.  Patient awaiting inpatient placement.  Most recent labs: CBG 226 Magnesium 1.7 Urinalysis does not appear infectious BMP nonacute UDS positive for benzodiazepines Tylenol level negative Salicylate level negative COVID-19 negative Ethanol negative CMP nonacute, had potassium 2.9 prior to repletion and subsequent BMP as above CBC nonacute  EKG without acute findings reviewed by Dr. Randal Buba - Vital signs from 8:08 AM this morning: 97.9 F, pulse 80 bpm, blood pressure 134/90, respiratory rate 16, SPO2 99%. Physical Exam  BP 134/90 (BP Location: Right Arm)   Pulse 80   Temp 97.9 F (36.6 C) (Oral)   Resp 16   SpO2 99%   Physical Exam Constitutional:      General: She is not in acute distress.    Appearance: Normal appearance. She is well-developed. She is not ill-appearing or diaphoretic.  HENT:     Head: Normocephalic and atraumatic.     Right Ear: External ear normal.     Left Ear: External ear normal.     Nose: Nose normal.  Eyes:     General: Vision grossly intact. Gaze aligned appropriately.     Pupils: Pupils are equal, round, and reactive to light.  Neck:     Musculoskeletal: Normal range of motion.     Trachea: Trachea and phonation normal. No tracheal deviation.  Pulmonary:     Effort: Pulmonary effort is normal. No respiratory distress.  Musculoskeletal: Normal range of motion.  Skin:    General: Skin is warm and dry.  Neurological:     Mental Status: She is alert.     GCS: GCS eye subscore is 4. GCS verbal subscore is 5. GCS motor subscore  is 6.     Comments: Speech is clear and goal oriented, follows commands Major Cranial nerves without deficit, no facial droop Moves extremities without ataxia, coordination intact  Psychiatric:        Behavior: Behavior normal.     ED Course/Procedures     Procedures  MDM  On evaluation patient is sitting up on edge of bed resting comfortably and in no acute distress.  She has just finished breakfast and is eating a banana during our conversation.  She reports she would like to go home but states understanding that she must remain here for placement.  She has no complaints at this time.  At this time there does not appear to be any evidence of an acute emergency medical condition and the patient appears stable for continued psychiatric observation.  Note: Portions of this report may have been transcribed using voice recognition software. Every effort was made to ensure accuracy; however, inadvertent computerized transcription errors may still be present.    Gari Crown 11/30/18 1017    Gareth Morgan, MD 12/02/18 [phone]

## 2018-11-30 NOTE — ED Notes (Signed)
Pt asking for reading glasses and Bible. Advised pt she may call family member to bring these items if she would prefer. Pt called her sister and requested for her to bring them. Pt given paper and pen so may write as requested.

## 2018-11-30 NOTE — ED Notes (Signed)
Pt on 2nd phone call for the day talking to her son.

## 2018-11-30 NOTE — ED Notes (Signed)
Pt updated on tx plan - continuing to seek Inpt placement. States she is OK w/this but would prefer to be d/c'd to home. RN allowed pt to vent feelings/concerns.

## 2018-11-30 NOTE — ED Notes (Signed)
Pt CBG 226. Notified RN.

## 2018-12-01 DIAGNOSIS — F312 Bipolar disorder, current episode manic severe with psychotic features: Secondary | ICD-10-CM | POA: Diagnosis not present

## 2018-12-01 LAB — CBG MONITORING, ED
Glucose-Capillary: 175 mg/dL — ABNORMAL HIGH (ref 70–99)
Glucose-Capillary: 184 mg/dL — ABNORMAL HIGH (ref 70–99)

## 2018-12-01 NOTE — ED Notes (Signed)
Pt ambulated to shower independently and sitter is with pt.

## 2018-12-01 NOTE — ED Notes (Signed)
Breakfast tray ordered 

## 2018-12-01 NOTE — ED Provider Notes (Signed)
Patient awake, alert, sitting upright.  Patient has been accepted for transfer to psychiatric hospital.  Dr. Leanna Sato is the accepting physician.   Carmin Muskrat, MD 12/01/18 [phone]

## 2018-12-01 NOTE — ED Notes (Signed)
Attempted to call pt's son to update of transfer per pt request, son did not answer

## 2018-12-11 ENCOUNTER — Telehealth: Payer: Self-pay

## 2018-12-11 ENCOUNTER — Telehealth (HOSPITAL_COMMUNITY): Payer: Self-pay | Admitting: Psychiatry

## 2018-12-11 NOTE — Telephone Encounter (Signed)
D:  Pt's son called inquiring about group for his mother.  "My mom was discharged from the mental hospital and we were told to call for a group."  A: Transferred pt's son to [facility].  Told him that one of the counselors from [facility] would be calling.  R:  He was receptive.

## 2018-12-11 NOTE — Telephone Encounter (Signed)
Thank you :)

## 2018-12-11 NOTE — Telephone Encounter (Signed)
Pt's mother requesting to speak with a nurse about Insulin Glargine (LANTUS) 100 UNIT/ML Solostar Pen. Requesting a new Rx, states the daughter accidentally threw the med away. Please call back.

## 2018-12-11 NOTE — Telephone Encounter (Signed)
Pt's mother stated pt was in [place] and was in the hospital. So now she back in [place]. But her granddaughter threw away the pt's Lantus insulin b/c she thought her grandmother told her to. Pt's mother stated she had called the [facility] but was told they will need a new rx. Per chart it was refilled on 8/1 x 1 RF and sent to [facility] in [place].  I called [facility], talked to [place]. Stated he had received a call from the pt's mother and was told about had happened. Stated they had refilled Lantus once and need a new rx. Told him about last one sent on 8/1 - stated they did not received it. Verbal order given for "Lantus solostar pen Inject 19 units into the skin daily Qty 15 ml x 1 RF" per Dr Tarri Abernethy per 8/1. Stated he will run it thru to see if pt's insurance will pay.  Called pt's mother back to let her know. She stated the rx went to [facility] in [place] ; I told her it was sent to [facility] and the pharmacist stated they did not received it on 8/1. And the pharmacy was called them.

## 2018-12-16 ENCOUNTER — Other Ambulatory Visit: Payer: Self-pay | Admitting: *Deleted

## 2018-12-17 MED ORDER — AMLODIPINE BESYLATE 10 MG PO TABS: 10.0000 mg | ORAL_TABLET | Freq: Every day | ORAL | 3 refills | Status: DC

## 2018-12-18 NOTE — Progress Notes (Deleted)
Cardiology Office Note   Date:  12/18/2018   ID:  [name], DOB 1966-04-03, MRN [id number]  PCP:  Ina Homes, MD  Cardiologist:   Dorris Carnes, MD       History of Present Illness: [name] is a 53 y.o. female with a history of       No outpatient medications have been marked as taking for the 12/19/18 encounter (Appointment) with Fay Records, MD.     Allergies:   Cephalexin, Metronidazole, Orange, Orange oil, Other, Shrimp [shellfish allergy], Penicillins, Sulfonamide derivatives, Glipizide, Ace inhibitors, Sulfamethoxazole, Sulfamethoxazole-trimethoprim, and Trimethoprim   Past Medical History:  Diagnosis Date  . Alcohol abuse   . Anemia, iron deficiency   . Arteriosclerotic cardiovascular disease (ASCVD)    Minimal at cath in [facility].stress nuclear study in 8/08 with nl EF; neg stress echo in 2010  . Community acquired pneumonia 01/03/10, 05/2010, 04/2012   2011; with pleural effusion-[facility] acute resp failure; intubated in Jan 2014 (HMPV pneumonia)  . Depression   . Diabetes mellitus, type 2 ([place]) 2000   Onset in 2000; no insulin  . Diarrhea 10/14/2013   Started 10/10/13, improved with Imodium.   . Diastolic dysfunction    grade 2 per echo 2011  . Dysphagia   . Gastroesophageal reflux disease    Schatzki's ring  . History of alcohol abuse 07/22/2007   Qualifier: Diagnosis of  By: Lenn Cal    . Hyperlipidemia   . Hypertension `   during treatment with Geodon  . Hypokalemia 12/25/2013  . Left knee pain 08/25/2014  . Obesity   . Oral candidiasis 05/16/2017  . PTSD (post-traumatic stress disorder)   . Pulmonary hypertension ([place]) 05/02/2012   Patient needs repeat echo in 06/2012   . Schizoaffective disorder    requiring multiple psychiatric admissions  . Viral URI 05/12/2013  . Viral wart on finger 10/08/2016  . Vision changes 08/12/2014    Past Surgical History:  Procedure Laterality Date  . COLONOSCOPY  01/2006   internal hemorrhoids  . COLONOSCOPY  01/10/2012   Dr. Rourk:Single anal canal hemorrhoidal tag likely source of  trivial hematochezia; right-sided colonic diverticulosis  . DILATION AND CURETTAGE, DIAGNOSTIC / THERAPEUTIC  1992  . ESOPHAGEAL DILATION N/A 08/18/2015   Procedure: ESOPHAGEAL DILATION;  Surgeon: Daneil Dolin, MD;  [facility];  Service: Endoscopy;  Laterality: N/A;  . ESOPHAGOGASTRODUODENOSCOPY  09/16/08   Dr. Trevor Iha hiatal hernia/excoriations involving the cardia and mucosa consistent with trauma, antral erosions  of linear petechiae ? gastritis versus early gastric antral vascular  ectasia.Marland Kitchen biopsy showed reactive gastropathy. No H. pylori.  . ESOPHAGOGASTRODUODENOSCOPY  09/2007   Dr. Evalee Mutton ring, dilated to 23 French Maloney dilator, small hiatal hernia, antral erosions, biopsies reactive gastropathy.  . ESOPHAGOGASTRODUODENOSCOPY (EGD) WITH PROPOFOL N/A 12/17/2013   OZY:YQMGNOI antral erosions and petechiae. Small hiatal hernia. No endoscopic explanation for patient's symptoms  . ESOPHAGOGASTRODUODENOSCOPY (EGD) WITH PROPOFOL N/A 08/18/2015   Procedure: ESOPHAGOGASTRODUODENOSCOPY (EGD) WITH PROPOFOL;  Surgeon: Daneil Dolin, MD;  Location: [facility];  Service: Endoscopy;  Laterality: N/A;  0845  . SAVORY DILATION  07/17/2011   Fields-[facility] sedation-->distal esophageal stricture s/p dilation, chronic gastritis, multiple ulcers in stomach. no h.pylori     Social History:  The patient  reports that she has been smoking cigarettes. She started smoking about 6 years ago. She has a 22.50 pack-year smoking history. She has never used smokeless tobacco. She reports that she does  not drink alcohol or use drugs.   Family History:  The patient's family history includes Arthritis in an other family member; Colon cancer in an other family member; Diabetes in an other family member; Heart disease in her sister; High Cholesterol in an other family member; Hypertension in  her mother; Stroke in her father.    ROS:  Please see the history of present illness. All other systems are reviewed and  Negative to the above problem except as noted.    PHYSICAL EXAM: VS:  There were no vitals taken for this visit.  GEN: Well nourished, well developed, in no acute distress  HEENT: normal  Neck: no JVD, carotid bruits, or masses Cardiac: RRR; no murmurs, rubs, or gallops,no edema  Respiratory:  clear to auscultation bilaterally, normal work of breathing GI: soft, nontender, nondistended, + BS  No hepatomegaly  MS: no deformity Moving all extremities   Skin: warm and dry, no rash Neuro:  Strength and sensation are intact Psych: euthymic mood, full affect   EKG:  EKG is ordered today.   Lipid Panel    Component Value Date/Time   CHOL 142 09/15/2018 0626   CHOL 191 06/12/2018 1416   TRIG 118 09/15/2018 0626   HDL 30 (L) 09/15/2018 0626   HDL 34 (L) 06/12/2018 1416   CHOLHDL 4.7 09/15/2018 0626   VLDL 24 09/15/2018 0626   LDLCALC 88 09/15/2018 0626   LDLCALC 117 (H) 06/12/2018 1416      Wt Readings from Last 3 Encounters:  10/16/18 193 lb 12.8 oz (87.9 kg)  06/30/18 201 lb 12.8 oz (91.5 kg)  06/12/18 198 lb 14.4 oz (90.2 kg)      ASSESSMENT AND PLAN:     Current medicines are reviewed at length with the patient today.  The patient does not have concerns regarding medicines.  Signed, Dorris Carnes, MD  12/18/2018 10:18 PM    [facility], [place], [place]  [address] Phone: [phone]; Fax: [phone]

## 2018-12-19 ENCOUNTER — Ambulatory Visit: Payer: Medicare Other | Admitting: Internal Medicine

## 2018-12-24 DIAGNOSIS — Z79899 Other long term (current) drug therapy: Secondary | ICD-10-CM | POA: Diagnosis not present

## 2019-01-29 ENCOUNTER — Encounter: Payer: Self-pay | Admitting: Internal Medicine

## 2019-01-29 ENCOUNTER — Encounter: Payer: Medicare Other | Admitting: Internal Medicine

## 2019-02-09 ENCOUNTER — Telehealth: Payer: Self-pay | Admitting: Internal Medicine

## 2019-02-09 NOTE — Telephone Encounter (Signed)
Called / talked to pt's son, [name] - stated pt is trying to quit smoking,requesting her doctor to prescribe her something. Send to [employer]. Thanks

## 2019-02-09 NOTE — Telephone Encounter (Signed)
Pt son wants to speak to someone about getting a pack for pt to stop smoking [phone]

## 2019-02-10 NOTE — Telephone Encounter (Signed)
Attempted to call patient. No answer. HIPPA compliant voicemail left. Please let me know if she calls back. Her options would be nicotine replacement with gum vs patches. Cannot use Chantix given mental health.   Thanks,  Ina Homes, MD

## 2019-02-11 NOTE — Telephone Encounter (Signed)
Called son - no answer nor vm. Called pt - no answer, left message to call the office.

## 2019-02-19 ENCOUNTER — Other Ambulatory Visit: Payer: Self-pay

## 2019-02-19 ENCOUNTER — Ambulatory Visit (INDEPENDENT_AMBULATORY_CARE_PROVIDER_SITE_OTHER): Payer: Medicare Other | Admitting: Internal Medicine

## 2019-02-19 ENCOUNTER — Encounter: Payer: Self-pay | Admitting: Internal Medicine

## 2019-02-19 VITALS — BP 108/60 | HR 72 | Temp 98.3°F | Ht 64.0 in | Wt 198.2 lb

## 2019-02-19 DIAGNOSIS — F17200 Nicotine dependence, unspecified, uncomplicated: Secondary | ICD-10-CM

## 2019-02-19 DIAGNOSIS — I10 Essential (primary) hypertension: Secondary | ICD-10-CM

## 2019-02-19 DIAGNOSIS — Z23 Encounter for immunization: Secondary | ICD-10-CM

## 2019-02-19 DIAGNOSIS — K219 Gastro-esophageal reflux disease without esophagitis: Secondary | ICD-10-CM | POA: Diagnosis not present

## 2019-02-19 DIAGNOSIS — Z720 Tobacco use: Secondary | ICD-10-CM | POA: Diagnosis not present

## 2019-02-19 DIAGNOSIS — Z8659 Personal history of other mental and behavioral disorders: Secondary | ICD-10-CM

## 2019-02-19 DIAGNOSIS — Z794 Long term (current) use of insulin: Secondary | ICD-10-CM

## 2019-02-19 DIAGNOSIS — B079 Viral wart, unspecified: Secondary | ICD-10-CM | POA: Insufficient documentation

## 2019-02-19 DIAGNOSIS — Z79899 Other long term (current) drug therapy: Secondary | ICD-10-CM | POA: Diagnosis not present

## 2019-02-19 DIAGNOSIS — E119 Type 2 diabetes mellitus without complications: Secondary | ICD-10-CM

## 2019-02-19 LAB — POCT GLYCOSYLATED HEMOGLOBIN (HGB A1C): Hemoglobin A1C: 7.3 % — AB (ref 4.0–5.6)

## 2019-02-19 LAB — GLUCOSE, CAPILLARY: Glucose-Capillary: 194 mg/dL — ABNORMAL HIGH (ref 70–99)

## 2019-02-19 MED ORDER — NICOTINE 21 MG/24HR TD PT24: 21.0000 mg | MEDICATED_PATCH | Freq: Every day | TRANSDERMAL | 0 refills | Status: DC

## 2019-02-19 MED ORDER — INSULIN GLARGINE 100 UNIT/ML SOLOSTAR PEN: 19.0000 [IU] | PEN_INJECTOR | Freq: Every day | SUBCUTANEOUS | 1 refills | Status: DC

## 2019-02-19 MED ORDER — ESOMEPRAZOLE MAGNESIUM 40 MG PO CPDR: DELAYED_RELEASE_CAPSULE | ORAL | 2 refills | Status: DC

## 2019-02-19 MED ORDER — PEN NEEDLES 31G X 5 MM MISC: 18.0000 [IU] | Freq: Every day | 2 refills | Status: DC

## 2019-02-19 NOTE — Assessment & Plan Note (Signed)
Patient with uncontrolled diabetes mellitus. Her last A1c was 9.2. At her last visit we switched her to a GLP-1/basal insulin combination. She was not able to tolerate this due to the upset stomach. She has tried to start taking her insulin were consistently. She is currently on Lantus 20 mg once daily and metformin 1000 mg twice daily. She is tolerating both these medications well without any adverse effects. She denies signs or symptoms of hypoglycemia. She does need a refill on her Lantus and her needles. She did not bring her CBG monitor. In regards to lifestyle modifications. She is not drinking any soda. She is not physically active.  A/P: - A1c definitely improved to 7.3 - Encouraged her to continue her current lifestyle modifications and using her medications as prescribed. - Continue Lantus 20 units daily and metformin 1000 mg daily - Continue losartan 100 mg daily - Continue lovastatin 40 mg

## 2019-02-19 NOTE — Assessment & Plan Note (Signed)
Patient with known [name]. Previously seen by gastroenterology. She is currently prescribed Nexium 40 mg twice daily. At her last visit we tried to titrate her off this medication however she had a recurrence of her symptoms. She has not seen gastroenterology in several years. She is requesting a refill on her Nexium.  A/P: - Refill Nexium 40 mg twice daily

## 2019-02-19 NOTE — Assessment & Plan Note (Signed)
Patient with known hypertension. She is currently well controlled on amlodipine 10 mg, losartan 100 mg, metoprolol tartrate 100 mg BID, and spironolactone 50 mg QD. She is tolerating these medications well without any adverse effects. She denies orthostatic symptoms. She had a BMP done in August with normal renal function and potassium. She is concerned about possible dehydration today due to her medications.  A/P: - Amlodipine 10 mg, losartan 100 mg, metoprolol tartrate 100 mg BID, and spironolactone 50 mg QD - Recheck BMP and UA

## 2019-02-19 NOTE — Progress Notes (Signed)
   CC: DM, Tobacco use, GERD, Warts  HPI:  [name] is a 53 y.o. female with PMHx listed below presenting for DM, tobacco use. Please see the A&P for the status of the patient's chronic medical problems.  Past Medical History:  Diagnosis Date  . Alcohol abuse   . Anemia, iron deficiency   . Arteriosclerotic cardiovascular disease (ASCVD)    Minimal at cath in [facility].stress nuclear study in 8/08 with nl EF; neg stress echo in 2010  . Community acquired pneumonia 01/03/10, 05/2010, 04/2012   2011; with pleural effusion-[facility] acute resp failure; intubated in Jan 2014 (HMPV pneumonia)  . Depression   . Diabetes mellitus, type 2 ([place]) 2000   Onset in 2000; no insulin  . Diarrhea 10/14/2013   Started 10/10/13, improved with Imodium.   . Diastolic dysfunction    grade 2 per echo 2011  . Dysphagia   . Gastroesophageal reflux disease    Schatzki's ring  . History of alcohol abuse 07/22/2007   Qualifier: Diagnosis of  By: Lenn Cal    . Hyperlipidemia   . Hypertension `   during treatment with Geodon  . Hypokalemia 12/25/2013  . Left knee pain 08/25/2014  . Obesity   . Oral candidiasis 05/16/2017  . PTSD (post-traumatic stress disorder)   . Pulmonary hypertension ([place]) 05/02/2012   Patient needs repeat echo in 06/2012   . Schizoaffective disorder    requiring multiple psychiatric admissions  . Viral URI 05/12/2013  . Viral wart on finger 10/08/2016  . Vision changes 08/12/2014   Review of Systems:  Performed and all others negative.  Physical Exam: Vitals:   02/19/19 1344  Weight: 198 lb 3.2 oz (89.9 kg)  Height: 5\' 4"  (1.626 m)   General: Well nourished female in no acute distress Pulm: Good air movement with no wheezing or crackles  CV: RRR, no murmurs, no rubs   Assessment & Plan:   See Encounters Tab for problem based charting.  Patient discussed with Dr. Dareen Piano

## 2019-02-19 NOTE — Patient Instructions (Addendum)
Thank you for allowing [name] to provide your care. Today we discussed:  1) Your diabetes is doing great. I will send out refills for your Lantus and your insulin needles. Please continue all your medications as prescribed.  2) GERD. I will send out a refill on her Nexium.  3) We will check some basic lab work in urine to make sure you're not dehydrated. I will call you if we need to change anything.  4) I have sent out nicotine patches to help you quit smoking. You will use the 21 mg patches for 14 days then drop down to 14 mg for 14 days and finally finish with 7 mg for 14 days.  5) Today we did cryotherapy on your warts. They should blister and fall off. If this does not occur please let us know.  I would like to see you back in three months or sooner if any issues arise.

## 2019-02-19 NOTE — Assessment & Plan Note (Signed)
Patient with two warts on her left hand. Located on the medial and lateral side of her third digit. She is tried cryotherapy in the past without success. She is not tried any over-the-counter medications. She is interested in having them removed today. We discussed trying cryotherapy again. She is in agreement.  I performed in an office cryotherapy treatment on both warts. She was advised that these will likely blister and not to remove them to let them fall off on their own. She will return if this does not improve.

## 2019-02-19 NOTE — Assessment & Plan Note (Signed)
Patient currently smoking one pack per day. She is interested in smoking cessation. She has a history of bipolar and multiple admissions for psychosis. We discussed the various pharmacologic options. She is not a candidate for Chantix. Would be hesitant to prescribe Wellbutrin given her psychiatric history. We discussed using nicotine replacement. We will start nicotine patches 21 mg for 14 days followed by 14 mg for 14 days then finish with 7 mg for 14 days.

## 2019-02-20 LAB — URINALYSIS, ROUTINE W REFLEX MICROSCOPIC
Bilirubin, UA: NEGATIVE
Glucose, UA: NEGATIVE
Ketones, UA: NEGATIVE
Leukocytes,UA: NEGATIVE
Nitrite, UA: NEGATIVE
Protein,UA: NEGATIVE
RBC, UA: NEGATIVE
Specific Gravity, UA: 1.016 (ref 1.005–1.030)
Urobilinogen, Ur: 0.2 mg/dL (ref 0.2–1.0)
pH, UA: 5.5 (ref 5.0–7.5)

## 2019-02-20 LAB — BMP8+ANION GAP
Anion Gap: 19 mmol/L — ABNORMAL HIGH (ref 10.0–18.0)
BUN/Creatinine Ratio: 11 (ref 9–23)
BUN: 10 mg/dL (ref 6–24)
CO2: 19 mmol/L — ABNORMAL LOW (ref 20–29)
Calcium: 11 mg/dL — ABNORMAL HIGH (ref 8.7–10.2)
Chloride: 100 mmol/L (ref 96–106)
Creatinine, Ser: 0.94 mg/dL (ref 0.57–1.00)
GFR calc Af Amer: 81 mL/min/{1.73_m2} (ref >59)
GFR calc non Af Amer: 70 mL/min/{1.73_m2} (ref >59)
Glucose: 156 mg/dL — ABNORMAL HIGH (ref 65–99)
Potassium: 4.6 mmol/L (ref 3.5–5.2)
Sodium: 138 mmol/L (ref 134–144)

## 2019-02-20 NOTE — Progress Notes (Signed)
Internal Medicine Clinic Attending  Case discussed with Dr. Helberg at the time of the visit.  We reviewed the resident's history and exam and pertinent patient test results.  I agree with the assessment, diagnosis, and plan of care documented in the resident's note.    

## 2019-02-23 ENCOUNTER — Telehealth: Payer: Self-pay | Admitting: Internal Medicine

## 2019-02-23 NOTE — Telephone Encounter (Signed)
Attempted to call patient to discuss her lab work. No answer. HIPAA compliant voicemail left. Her calcium came back elevated and she will need further evaluation. I have placed orders for additional lab work that she needs to come get as soon as possible.

## 2019-02-25 ENCOUNTER — Telehealth: Payer: Self-pay | Admitting: Internal Medicine

## 2019-02-25 NOTE — Telephone Encounter (Signed)
Pt rtc from dr Tarri Abernethy, gave her message from his call 11/9, scheduled lab appt 11/12 at 1400, if orders needed please place

## 2019-02-25 NOTE — Telephone Encounter (Signed)
Pt requesting Lab results.

## 2019-02-26 ENCOUNTER — Other Ambulatory Visit: Payer: Self-pay

## 2019-02-26 ENCOUNTER — Other Ambulatory Visit (INDEPENDENT_AMBULATORY_CARE_PROVIDER_SITE_OTHER): Payer: Medicare Other

## 2019-02-27 ENCOUNTER — Telehealth: Payer: Self-pay | Admitting: Internal Medicine

## 2019-02-27 LAB — PTH, INTACT AND CALCIUM
Calcium: 10.1 mg/dL (ref 8.7–10.2)
PTH: 18 pg/mL (ref 15–65)

## 2019-02-27 NOTE — Telephone Encounter (Signed)
[name] called requesting results of her follow-up labs for hypercalcemia. Informed her that everything was normal, and she can have her level periodically monitored at PCP visits.

## 2019-04-21 ENCOUNTER — Ambulatory Visit: Payer: Medicare Other | Admitting: Podiatry

## 2019-05-22 ENCOUNTER — Telehealth: Payer: Self-pay | Admitting: Internal Medicine

## 2019-05-22 NOTE — Telephone Encounter (Signed)
Patient would like a call back.  Requesting medicine to help with being sick on the stomach the last few days.  Telephone number on file is correct.

## 2019-05-22 NOTE — Telephone Encounter (Signed)
RTC to patient, she c/o mild nausea with "a little bit of vomitting at times yesterday, better today".  States she thinks it is related to her Lithium medication and she has a call into her Insurance risk surveyor.  Instructed pt to drink plenty of fluids and try eating small, frequent, bland foods.  Pt states she is drinking a lot of water and able to keep this down, with crackers and toast.  Instructed pt if nausea and vomiting increases or if she is unable to hold fluids/food to present to urgent care, she verbalized understanding.  She states she is having some memory issues and can't remember things as well as she once could X 1 month.  Appt offered in [facility], pt agreeable and appt made for 05/26/19 in [facility] at 3:45. SChaplin, RN,BSN

## 2019-05-22 NOTE — Telephone Encounter (Signed)
Thank you :)

## 2019-05-26 ENCOUNTER — Ambulatory Visit: Payer: Medicare Other

## 2019-06-03 ENCOUNTER — Encounter (HOSPITAL_COMMUNITY): Payer: Self-pay | Admitting: Emergency Medicine

## 2019-06-03 ENCOUNTER — Other Ambulatory Visit: Payer: Self-pay

## 2019-06-03 ENCOUNTER — Encounter (HOSPITAL_COMMUNITY): Payer: Self-pay | Admitting: *Deleted

## 2019-06-03 ENCOUNTER — Inpatient Hospital Stay (HOSPITAL_COMMUNITY)
Admission: EM | Admit: 2019-06-03 | Discharge: 2019-06-05 | DRG: 917 | Disposition: A | Discharge status: Home Health | Payer: Medicare Other | Attending: Student | Admitting: Student

## 2019-06-03 ENCOUNTER — Emergency Department (HOSPITAL_COMMUNITY)
Admission: EM | Admit: 2019-06-03 | Discharge: 2019-06-03 | Disposition: A | Discharge status: Home / Self Care | Payer: Medicare Other | Source: Home / Self Care

## 2019-06-03 ENCOUNTER — Telehealth: Payer: Self-pay

## 2019-06-03 DIAGNOSIS — Z880 Allergy status to penicillin: Secondary | ICD-10-CM

## 2019-06-03 DIAGNOSIS — K219 Gastro-esophageal reflux disease without esophagitis: Secondary | ICD-10-CM | POA: Diagnosis present

## 2019-06-03 DIAGNOSIS — F17210 Nicotine dependence, cigarettes, uncomplicated: Secondary | ICD-10-CM | POA: Diagnosis present

## 2019-06-03 DIAGNOSIS — Z794 Long term (current) use of insulin: Secondary | ICD-10-CM | POA: Diagnosis not present

## 2019-06-03 DIAGNOSIS — Z20822 Contact with and (suspected) exposure to covid-19: Secondary | ICD-10-CM | POA: Diagnosis present

## 2019-06-03 DIAGNOSIS — I10 Essential (primary) hypertension: Secondary | ICD-10-CM | POA: Diagnosis not present

## 2019-06-03 DIAGNOSIS — G9341 Metabolic encephalopathy: Secondary | ICD-10-CM | POA: Diagnosis not present

## 2019-06-03 DIAGNOSIS — R296 Repeated falls: Secondary | ICD-10-CM | POA: Diagnosis present

## 2019-06-03 DIAGNOSIS — J449 Chronic obstructive pulmonary disease, unspecified: Secondary | ICD-10-CM | POA: Diagnosis present

## 2019-06-03 DIAGNOSIS — F1020 Alcohol dependence, uncomplicated: Secondary | ICD-10-CM | POA: Diagnosis not present

## 2019-06-03 DIAGNOSIS — E785 Hyperlipidemia, unspecified: Secondary | ICD-10-CM | POA: Diagnosis present

## 2019-06-03 DIAGNOSIS — T56891A Toxic effect of other metals, accidental (unintentional), initial encounter: Secondary | ICD-10-CM | POA: Diagnosis not present

## 2019-06-03 DIAGNOSIS — F259 Schizoaffective disorder, unspecified: Secondary | ICD-10-CM | POA: Diagnosis present

## 2019-06-03 DIAGNOSIS — Z882 Allergy status to sulfonamides status: Secondary | ICD-10-CM | POA: Diagnosis not present

## 2019-06-03 DIAGNOSIS — E1165 Type 2 diabetes mellitus with hyperglycemia: Secondary | ICD-10-CM | POA: Diagnosis not present

## 2019-06-03 DIAGNOSIS — I110 Hypertensive heart disease with heart failure: Secondary | ICD-10-CM | POA: Diagnosis present

## 2019-06-03 DIAGNOSIS — E861 Hypovolemia: Secondary | ICD-10-CM | POA: Diagnosis present

## 2019-06-03 DIAGNOSIS — N179 Acute kidney failure, unspecified: Secondary | ICD-10-CM | POA: Diagnosis present

## 2019-06-03 DIAGNOSIS — F251 Schizoaffective disorder, depressive type: Secondary | ICD-10-CM | POA: Diagnosis not present

## 2019-06-03 DIAGNOSIS — I2510 Atherosclerotic heart disease of native coronary artery without angina pectoris: Secondary | ICD-10-CM | POA: Diagnosis present

## 2019-06-03 DIAGNOSIS — F17200 Nicotine dependence, unspecified, uncomplicated: Secondary | ICD-10-CM

## 2019-06-03 DIAGNOSIS — Z8249 Family history of ischemic heart disease and other diseases of the circulatory system: Secondary | ICD-10-CM | POA: Diagnosis not present

## 2019-06-03 DIAGNOSIS — E119 Type 2 diabetes mellitus without complications: Secondary | ICD-10-CM | POA: Diagnosis present

## 2019-06-03 DIAGNOSIS — T43591A Poisoning by other antipsychotics and neuroleptics, accidental (unintentional), initial encounter: Principal | ICD-10-CM | POA: Diagnosis present

## 2019-06-03 DIAGNOSIS — Z888 Allergy status to other drugs, medicaments and biological substances status: Secondary | ICD-10-CM

## 2019-06-03 DIAGNOSIS — F4310 Post-traumatic stress disorder, unspecified: Secondary | ICD-10-CM | POA: Diagnosis present

## 2019-06-03 DIAGNOSIS — G92 Toxic encephalopathy: Secondary | ICD-10-CM | POA: Diagnosis present

## 2019-06-03 DIAGNOSIS — R4182 Altered mental status, unspecified: Secondary | ICD-10-CM | POA: Diagnosis present

## 2019-06-03 DIAGNOSIS — I5032 Chronic diastolic (congestive) heart failure: Secondary | ICD-10-CM | POA: Diagnosis present

## 2019-06-03 DIAGNOSIS — Z91013 Allergy to seafood: Secondary | ICD-10-CM | POA: Diagnosis not present

## 2019-06-03 DIAGNOSIS — Z881 Allergy status to other antibiotic agents status: Secondary | ICD-10-CM

## 2019-06-03 DIAGNOSIS — R531 Weakness: Secondary | ICD-10-CM | POA: Diagnosis not present

## 2019-06-03 DIAGNOSIS — Z823 Family history of stroke: Secondary | ICD-10-CM

## 2019-06-03 DIAGNOSIS — I272 Pulmonary hypertension, unspecified: Secondary | ICD-10-CM | POA: Diagnosis present

## 2019-06-03 LAB — LIPASE, BLOOD: Lipase: 19 U/L (ref 11–51)

## 2019-06-03 LAB — CBC
HCT: 41.4 % (ref 36.0–46.0)
Hemoglobin: 13 g/dL (ref 12.0–15.0)
MCH: 28.4 pg (ref 26.0–34.0)
MCHC: 31.4 g/dL (ref 30.0–36.0)
MCV: 90.4 fL (ref 80.0–100.0)
Platelets: 224 10*3/uL (ref 150–400)
RBC: 4.58 MIL/uL (ref 3.87–5.11)
RDW: 15.5 % (ref 11.5–15.5)
WBC: 11.2 10*3/uL — ABNORMAL HIGH (ref 4.0–10.5)
nRBC: 0 % (ref 0.0–0.2)

## 2019-06-03 LAB — COMPREHENSIVE METABOLIC PANEL
ALT: 10 U/L (ref 0–44)
AST: 12 U/L — ABNORMAL LOW (ref 15–41)
Albumin: 4.2 g/dL (ref 3.5–5.0)
Alkaline Phosphatase: 81 U/L (ref 38–126)
Anion gap: 11 (ref 5–15)
BUN: 24 mg/dL — ABNORMAL HIGH (ref 6–20)
CO2: 19 mmol/L — ABNORMAL LOW (ref 22–32)
Calcium: 10.3 mg/dL (ref 8.9–10.3)
Chloride: 103 mmol/L (ref 98–111)
Creatinine, Ser: 1.75 mg/dL — ABNORMAL HIGH (ref 0.44–1.00)
GFR calc Af Amer: 38 mL/min — ABNORMAL LOW (ref >60)
GFR calc non Af Amer: 33 mL/min — ABNORMAL LOW (ref >60)
Glucose, Bld: 120 mg/dL — ABNORMAL HIGH (ref 70–99)
Potassium: 4.7 mmol/L (ref 3.5–5.1)
Sodium: 133 mmol/L — ABNORMAL LOW (ref 135–145)
Total Bilirubin: 0.7 mg/dL (ref 0.3–1.2)
Total Protein: 6.8 g/dL (ref 6.5–8.1)

## 2019-06-03 LAB — CBG MONITORING, ED
Glucose-Capillary: 118 mg/dL — ABNORMAL HIGH (ref 70–99)
Glucose-Capillary: 117 mg/dL — ABNORMAL HIGH (ref 70–99)

## 2019-06-03 LAB — RESPIRATORY PANEL BY RT PCR (FLU A&B, COVID)
Influenza A by PCR: NEGATIVE
Influenza B by PCR: NEGATIVE
SARS Coronavirus 2 by RT PCR: NEGATIVE

## 2019-06-03 LAB — LITHIUM LEVEL: Lithium Lvl: 2.21 mmol/L (ref 0.60–1.20)

## 2019-06-03 LAB — MAGNESIUM: Magnesium: 1.7 mg/dL (ref 1.7–2.4)

## 2019-06-03 MED ORDER — SODIUM CHLORIDE 0.9 % IV SOLN: INTRAVENOUS | Status: DC

## 2019-06-03 MED ORDER — SODIUM CHLORIDE 0.9% FLUSH: 3.0000 mL | Freq: Once | INTRAVENOUS | Status: DC

## 2019-06-03 MED ORDER — CHLORHEXIDINE GLUCONATE CLOTH 2 % EX PADS
6.0000 | MEDICATED_PAD | Freq: Every day | CUTANEOUS | Status: DC
  Administered 2019-06-04 – 2019-06-05 (×2): 6 via TOPICAL

## 2019-06-03 MED ORDER — SODIUM CHLORIDE 0.9 % IV BOLUS
1000.0000 mL | Freq: Once | INTRAVENOUS | Status: AC
  Administered 2019-06-03: 22:00:00 1000 mL via INTRAVENOUS

## 2019-06-03 MED ORDER — LACTATED RINGERS IV BOLUS: 2000.0000 mL | Freq: Once | INTRAVENOUS | Status: DC

## 2019-06-03 MED ORDER — LACTATED RINGERS IV BOLUS: 1000.0000 mL | Freq: Once | INTRAVENOUS | Status: DC

## 2019-06-03 MED ORDER — SODIUM CHLORIDE 0.9 % IV BOLUS
1000.0000 mL | Freq: Once | INTRAVENOUS | Status: AC
  Administered 2019-06-03: 1000 mL via INTRAVENOUS

## 2019-06-03 MED ORDER — LIDOCAINE-EPINEPHRINE (PF) 2 %-1:200000 IJ SOLN
INTRAMUSCULAR | Status: AC
  Filled 2019-06-03: qty 20

## 2019-06-03 NOTE — ED Notes (Signed)
The pts sister Cherlyn Cushing spicer can be reached at [phone]

## 2019-06-03 NOTE — Telephone Encounter (Signed)
Received TC from pt's daughter, Bernestine Amass.  She states pt has been vomitting, with diarrhea, fatigue, and  chills X 1 1/2 weeks.  States pt is so weak she fell last night when she got up from bed.  Pt is barely eating or drinking anything, states nothing taste right.  Pt's daughter states pt is c/o "some SOB and does not want to get out of bed because she is so weak".  Pt also has a cough,  But daughter states pt is a smoker and keeps a "smokers cough".  Pt has DM, daughter states her sugars have been "okay, but doesn't know the numbers".  RN recommended pt be seen in ER, she verbalized understanding and states pt will be on her way shortly.   SChaplin, RN,BSN

## 2019-06-03 NOTE — Telephone Encounter (Signed)
Agree. thanks

## 2019-06-03 NOTE — ED Notes (Signed)
Patient evaluated by Dr. Kathrynn Humble ( EDP) at triage , he explained admission and plan of care to patient and family .

## 2019-06-03 NOTE — ED Provider Notes (Signed)
[facility] EMERGENCY DEPARTMENT Provider Note   CSN: [id number] Arrival date & time: 06/03/19  1931     History Chief Complaint  Patient presents with  . Altered Mental Status    [name] is a 54 y.o. female.  HPI     54 year old female with history of depression, alcohol abuse, diabetes comes in a chief complaint of change in mental status.  Patient has no complaints from her side.  History was mostly provided by patient's sister.  She reports that patient has had change in behavior over the last 3 weeks.  Additionally the family has noticed that patient has had reduced p.o. intake for the last several days along with nausea and ataxia the last 2 weeks.  Patient has multiple falls when she is standing for longer than certain duration.  Also they started noticing that she is having some twitching and diarrhea today.  Patient is also confused.  Patient has been getting her medications as prescribed.  She has not been using any drugs.  Past Medical History:  Diagnosis Date  . Alcohol abuse   . Anemia, iron deficiency   . Arteriosclerotic cardiovascular disease (ASCVD)    Minimal at cath in [facility].stress nuclear study in 8/08 with nl EF; neg stress echo in 2010  . Community acquired pneumonia 01/03/10, 05/2010, 04/2012   2011; with pleural effusion-[facility]osp [facility] acute resp failure; intubated in Jan 2014 (HMPV pneumonia)  . Depression   . Diabetes mellitus, type 2 ([place]) 2000   Onset in 2000; no insulin  . Diarrhea 10/14/2013   Started 10/10/13, improved with Imodium.   . Diastolic dysfunction    grade 2 per echo 2011  . Dysphagia   . Gastroesophageal reflux disease    Schatzki's ring  . History of alcohol abuse 07/22/2007   Qualifier: Diagnosis of  By: Lenn Cal    . Hyperlipidemia   . Hypertension `   during treatment with Geodon  . Hypokalemia 12/25/2013  . Left knee pain 08/25/2014  . Obesity   . Oral candidiasis 05/16/2017  .  PTSD (post-traumatic stress disorder)   . Pulmonary hypertension ([place]) 05/02/2012   Patient needs repeat echo in 06/2012   . Schizoaffective disorder    requiring multiple psychiatric admissions  . Viral URI 05/12/2013  . Viral wart on finger 10/08/2016  . Vision changes 08/12/2014    Patient Active Problem List   Diagnosis Date Noted  . Lithium toxicity 06/03/2019  . Warts 02/19/2019  . Bipolar I disorder, most recent episode (or current) manic ([place]) 09/14/2018  . Schizoaffective disorder, bipolar type ([place]) 08/26/2017  . Immunity status testing 04/17/2017  . Encounter for health education 10/24/2014  . IIH (idiopathic intracranial hypertension) 08/12/2014  . Seasonal allergies 02/23/2014  . Daytime hypersomnolence 12/09/2012  . Insomnia 11/06/2012  . COPD (chronic obstructive pulmonary disease) ([place]) 10/20/2012  . GERD (gastroesophageal reflux disease) 10/15/2012  . Preventative health care 10/15/2012  . Pulmonary hypertension ([place]) 05/02/2012  . Diastolic heart failure ([place]) 02/07/2012  . IDA (iron deficiency anemia) 11/06/2011  . Fatigue 05/17/2011  . Arteriosclerotic cardiovascular disease (ASCVD)   . TOBACCO ABUSE 09/27/2009  . Hyperlipidemia 07/22/2007  . Obesity 07/22/2007  . Bipolar 1 disorder ([place]) 07/22/2007  . Resistant hypertension 07/22/2007  . Diabetes mellitus type 2, controlled ([place]) 09/28/2002    Past Surgical History:  Procedure Laterality Date  . COLONOSCOPY  01/2006   internal hemorrhoids  . COLONOSCOPY  01/10/2012   Dr. Rourk:Single anal  canal hemorrhoidal tag likely source of  trivial hematochezia; right-sided colonic diverticulosis  . DILATION AND CURETTAGE, DIAGNOSTIC / THERAPEUTIC  1992  . ESOPHAGEAL DILATION N/A 08/18/2015   Procedure: ESOPHAGEAL DILATION;  Surgeon: Daneil Dolin, MD;  Location: [facility] ENDO SUITE;  Service: Endoscopy;  Laterality: N/A;  . ESOPHAGOGASTRODUODENOSCOPY  09/16/08   Dr. Trevor Iha hiatal hernia/excoriations involving the  cardia and mucosa consistent with trauma, antral erosions  of linear petechiae ? gastritis versus early gastric antral vascular  ectasia.Marland Kitchen biopsy showed reactive gastropathy. No H. pylori.  . ESOPHAGOGASTRODUODENOSCOPY  09/2007   Dr. Evalee Mutton ring, dilated to 62 French Maloney dilator, small hiatal hernia, antral erosions, biopsies reactive gastropathy.  . ESOPHAGOGASTRODUODENOSCOPY (EGD) WITH PROPOFOL N/A 12/17/2013   YNW:GNFAOZH antral erosions and petechiae. Small hiatal hernia. No endoscopic explanation for patient's symptoms  . ESOPHAGOGASTRODUODENOSCOPY (EGD) WITH PROPOFOL N/A 08/18/2015   Procedure: ESOPHAGOGASTRODUODENOSCOPY (EGD) WITH PROPOFOL;  Surgeon: Daneil Dolin, MD;  Location: [facility] ENDO SUITE;  Service: Endoscopy;  Laterality: N/A;  0845  . SAVORY DILATION  07/17/2011   Fields-[facility] sedation-->distal esophageal stricture s/p dilation, chronic gastritis, multiple ulcers in stomach. no h.pylori     OB History    Gravida  3   Para  2   Term  2   Preterm      AB  1   Living  2     SAB      TAB  1   Ectopic      Multiple      Live Births  2           Family History  Problem Relation Age of Onset  . Hypertension Mother   . Stroke Father        deceased at age 36  . Colon cancer Other   . Heart disease Sister   . Diabetes Other   . High Cholesterol Other   . Arthritis Other   . Anesthesia problems Neg Hx   . Hypotension Neg Hx   . Malignant hyperthermia Neg Hx   . Pseudochol deficiency Neg Hx     Social History   Tobacco Use  . Smoking status: Current Some Day Smoker    Packs/day: 0.75    Years: 30.00    Pack years: 22.50    Types: Cigarettes    Start date: 05/18/2012  . Smokeless tobacco: Never Used  . Tobacco comment: 1 PPD  Substance Use Topics  . Alcohol use: No    Alcohol/week: 0.0 standard drinks    Comment: hx of ETOH abuse  . Drug use: No    Home Medications Prior to Admission medications   Medication Sig Start Date End Date  Taking? Authorizing Provider  Accu-Chek FastClix Lancets MISC Use to check you blood sugar 3 times daily 07/29/18  Yes Helberg, Larkin Ina, MD  albuterol (VENTOLIN HFA) 108 (90 Base) MCG/ACT inhaler Inhale 1-2 puffs into the lungs every 6 (six) hours as needed for wheezing or shortness of breath. 09/24/17  Yes Axel Filler, MD  amLODipine (NORVASC) 10 MG tablet Take 1 tablet (10 mg total) by mouth daily. For high blood pressure 12/17/18 12/17/19 Yes Helberg, Larkin Ina, MD  benztropine (COGENTIN) 1 MG tablet Take 1 mg by mouth at bedtime.   Yes [provider]  Blood Glucose Monitoring Suppl (ACCU-CHEK GUIDE) w/Device KIT 1 Device by Does not apply route 3 (three) times daily. Use to check blood sugars 3 times daily. 07/29/18  Yes Ina Homes, MD  esomeprazole ([place]) 40  MG capsule TAKE 1 CAPSULE(40 MG) BY MOUTH TWICE DAILY BEFORE A MEAL FOR ACID REFLUX 02/19/19  Yes Helberg, Larkin Ina, MD  ferrous sulfate 325 (65 FE) MG tablet Take 1 tablet (325 mg total) by mouth every other day. (May buy from over the counter): For anemia 09/24/17  Yes Axel Filler, MD  glucose blood (ACCU-CHEK GUIDE) test strip Use as instructed 07/29/18  Yes Helberg, Larkin Ina, MD  Insulin Glargine (LANTUS) 100 UNIT/ML Solostar Pen Inject 19 Units into the skin daily. Patient taking differently: Inject 15 Units into the skin daily.  02/19/19  Yes Helberg, Larkin Ina, MD  Insulin Pen Needle (PEN NEEDLES) 31G X 5 MM MISC Inject 18 Units into the skin at bedtime. Use to inject insulin daily at bedtime: For diabetes management: 02/19/19  Yes Helberg, Larkin Ina, MD  loratadine (CLARITIN) 10 MG tablet Take 1 tablet (10 mg total) by mouth daily. (May buy from over the counter): For allergies 07/25/18  Yes Neva Seat, MD  losartan (COZAAR) 100 MG tablet Take 1 tablet (100 mg total) by mouth daily. For high blood pressure 09/04/18  Yes Helberg, Larkin Ina, MD  lovastatin (MEVACOR) 40 MG tablet Take 1 tablet (40 mg total) by mouth  daily. Patient taking differently: Take 40 mg by mouth every evening.  09/04/18 06/04/19 Yes Helberg, Larkin Ina, MD  metFORMIN (GLUCOPHAGE) 1000 MG tablet Take 1 tablet (1,000 mg total) by mouth 2 (two) times daily with a meal. For diabetes management 09/04/18  Yes Ina Homes, MD  metoprolol tartrate (LOPRESSOR) 100 MG tablet Take 1 tablet (100 mg total) by mouth 2 (two) times daily. For high blood pressure 09/04/18  Yes Helberg, Larkin Ina, MD  sertraline (ZOLOFT) 50 MG tablet Take 50 mg by mouth daily.   Yes [provider]  spironolactone (ALDACTONE) 50 MG tablet Take 1 tablet (50 mg total) by mouth daily. For high blood pressure 09/04/18  Yes Helberg, Larkin Ina, MD  ziprasidone (GEODON) 60 MG capsule Take 60 mg by mouth 2 (two) times daily with a meal.   Yes [provider]  nicotine (NICODERM CQ - DOSED IN MG/24 HOURS) 21 mg/24hr patch Place 1 patch (21 mg total) onto the skin daily. Patient not taking: Reported on 06/04/2019 02/19/19   Ina Homes, MD  perphenazine (TRILAFON) 4 MG tablet 1 in am 2 at h s Patient not taking: Reported on 06/04/2019 09/17/18   Johnn Hai, MD  temazepam (RESTORIL) 30 MG capsule Take 1 capsule (30 mg total) by mouth at bedtime. Patient not taking: Reported on 06/04/2019 09/17/18   Johnn Hai, MD  traZODone (DESYREL) 100 MG tablet Take 1 tablet (100 mg total) by mouth at bedtime as needed for sleep. For sleep Patient not taking: Reported on 06/04/2019 09/24/17   Axel Filler, MD    Allergies    Cephalexin, Metronidazole, Orange, Orange oil, Other, Shrimp [shellfish allergy], Penicillins, Sulfonamide derivatives, Glipizide, Ace inhibitors, Sulfamethoxazole, Sulfamethoxazole-trimethoprim, and Trimethoprim  Review of Systems   Review of Systems  Constitutional: Positive for activity change.  Respiratory: Negative for shortness of breath.   Cardiovascular: Negative for chest pain.  Gastrointestinal: Positive for diarrhea, nausea and vomiting.   Neurological: Positive for dizziness and light-headedness.  All other systems reviewed and are negative.   Physical Exam Updated Vital Signs BP (!) 142/77 (BP Location: Right Arm)   Pulse 83   Temp 98.3 F (36.8 C) (Oral)   Resp 20   Ht _0  (1.626 m)   Wt 81.6 kg   SpO2 97%  BMI 30.90 kg/m   Physical Exam Vitals and nursing note reviewed.  Constitutional:      Appearance: She is well-developed.  HENT:     Head: Normocephalic and atraumatic.  Eyes:     Pupils: Pupils are equal, round, and reactive to light.  Cardiovascular:     Rate and Rhythm: Normal rate and regular rhythm.     Heart sounds: Normal heart sounds.  Pulmonary:     Effort: Pulmonary effort is normal. No respiratory distress.  Abdominal:     General: There is no distension.     Palpations: Abdomen is soft.     Tenderness: There is no abdominal tenderness. There is no guarding or rebound.  Musculoskeletal:        General: No swelling or tenderness.     Cervical back: Neck supple.     Comments: Hyperreflexia over patella.  Patient is noted to have twitching during our evaluation (neuromuscular hyperexcitation).  Skin:    General: Skin is warm and dry.  Neurological:     Mental Status: She is alert and oriented to person, place, and time.     ED Results / Procedures / Treatments   Labs (all labs ordered are listed, but only abnormal results are displayed) Labs Reviewed  COMPREHENSIVE METABOLIC PANEL - Abnormal; Notable for the following components:      Result Value   Sodium 133 (*)    CO2 19 (*)    Glucose, Bld 120 (*)    BUN 24 (*)    Creatinine, Ser 1.75 (*)    AST 12 (*)    GFR calc non Af Amer 33 (*)    GFR calc Af Amer 38 (*)    All other components within normal limits  CBC - Abnormal; Notable for the following components:   WBC 11.2 (*)    All other components within normal limits  LITHIUM LEVEL - Abnormal; Notable for the following components:   Lithium Lvl 2.21 (*)    All other  components within normal limits  APTT - Abnormal; Notable for the following components:   aPTT 21 (*)    All other components within normal limits  CK - Abnormal; Notable for the following components:   Total CK 25 (*)    All other components within normal limits  HEMOGLOBIN A1C - Abnormal; Notable for the following components:   Hgb A1c MFr Bld 7.4 (*)    All other components within normal limits  CBC - Abnormal; Notable for the following components:   WBC 11.9 (*)    Hemoglobin 11.7 (*)    All other components within normal limits  COMPREHENSIVE METABOLIC PANEL - Abnormal; Notable for the following components:   Potassium 3.4 (*)    Glucose, Bld 174 (*)    Creatinine, Ser 1.20 (*)    Calcium 8.3 (*)    Total Protein 6.0 (*)    AST 12 (*)    Total Bilirubin 1.6 (*)    GFR calc non Af Amer 52 (*)    All other components within normal limits  RENAL FUNCTION PANEL - Abnormal; Notable for the following components:   Potassium 3.4 (*)    Glucose, Bld 172 (*)    Creatinine, Ser 1.14 (*)    Calcium 8.4 (*)    Phosphorus 2.3 (*)    GFR calc non Af Amer 56 (*)    All other components within normal limits  GLUCOSE, CAPILLARY - Abnormal; Notable for the following components:  Glucose-Capillary 160 (*)    All other components within normal limits  URINALYSIS, COMPLETE (UACMP) WITH MICROSCOPIC - Abnormal; Notable for the following components:   APPearance HAZY (*)    Ketones, ur 20 (*)    Bacteria, UA RARE (*)    All other components within normal limits  GLUCOSE, CAPILLARY - Abnormal; Notable for the following components:   Glucose-Capillary 144 (*)    All other components within normal limits  GLUCOSE, CAPILLARY - Abnormal; Notable for the following components:   Glucose-Capillary 189 (*)    All other components within normal limits  RENAL FUNCTION PANEL - Abnormal; Notable for the following components:   Glucose, Bld 159 (*)    Creatinine, Ser 1.13 (*)    Calcium 8.3 (*)     Phosphorus 1.8 (*)    GFR calc non Af Amer 56 (*)    All other components within normal limits  CBC - Abnormal; Notable for the following components:   Hemoglobin 11.8 (*)    All other components within normal limits  MAGNESIUM - Abnormal; Notable for the following components:   Magnesium 1.3 (*)    All other components within normal limits  GLUCOSE, CAPILLARY - Abnormal; Notable for the following components:   Glucose-Capillary 143 (*)    All other components within normal limits  LIPID PANEL - Abnormal; Notable for the following components:   HDL 28 (*)    All other components within normal limits  CBG MONITORING, ED - Abnormal; Notable for the following components:   Glucose-Capillary 117 (*)    All other components within normal limits  RESPIRATORY PANEL BY RT PCR (FLU A&B, COVID)  LIPASE, BLOOD  MAGNESIUM  PROTIME-INR  HEPATITIS B SURFACE ANTIGEN  HEPATITIS B CORE ANTIBODY, TOTAL  HEPATITIS B SURFACE ANTIBODY,QUALITATIVE  LITHIUM LEVEL  HIV ANTIBODY (ROUTINE TESTING W REFLEX)  PROTIME-INR  SODIUM, URINE, RANDOM  CREATININE, URINE, RANDOM  LITHIUM LEVEL  LITHIUM LEVEL  URINALYSIS, ROUTINE W REFLEX MICROSCOPIC  SODIUM, URINE, RANDOM  CREATININE, URINE, RANDOM    EKG None  Radiology CT HEAD WO CONTRAST  Result Date: 06/04/2019 CLINICAL DATA:  Encephalopathy EXAM: CT HEAD WITHOUT CONTRAST TECHNIQUE: Contiguous axial images were obtained from the base of the skull through the vertex without intravenous contrast. COMPARISON:  08/25/2017 FINDINGS: Brain: No evidence of acute infarction, hemorrhage, hydrocephalus, extra-axial collection or mass lesion/mass effect. Vascular: No hyperdense vessel or unexpected calcification. Skull: Normal. Negative for fracture or focal lesion. Sinuses/Orbits: No acute finding. IMPRESSION: Negative head CT. Electronically Signed   By: Monte Fantasia M.D.   On: 06/04/2019 06:09   US RENAL  Result Date: 06/04/2019 CLINICAL DATA:  Acute renal  failure EXAM: RENAL / URINARY TRACT ULTRASOUND COMPLETE COMPARISON:  CT 04/27/2017 FINDINGS: Right Kidney: Renal measurements: 11.5 x 5.2 x 5.9 cm = volume: 186 mL. Increased renal cortical echogenicity. No mass or hydronephrosis visualized. Left Kidney: Renal measurements: 9.8 x 5.3 x 5.2 cm = volume: 142 mL. Increased renal cortical echogenicity. No mass or hydronephrosis visualized. Bladder: Appears normal for degree of bladder distention. Other: None. IMPRESSION: 1. Negative for obstructive uropathy. 2. Findings suggesting medical renal disease. Electronically Signed   By: Davina Poke D.O.   On: 06/04/2019 12:23    Procedures .Critical Care Performed by: [name], MD Authorized by: [name], MD   Critical care provider statement:    Critical care time (minutes):  85   Critical care was necessary to treat or prevent imminent or life-threatening deterioration of  the following conditions:  CNS failure or compromise and toxidrome   Critical care was time spent personally by me on the following activities:  Discussions with consultants, evaluation of patient's response to treatment, examination of patient, ordering and performing treatments and interventions, ordering and review of laboratory studies, ordering and review of radiographic studies, pulse oximetry, re-evaluation of patient's condition, obtaining history from patient or surrogate, review of old charts and development of treatment plan with patient or surrogate   I assumed direction of critical care for this patient from another provider in my specialty: yes   .Central Line  Date/Time: 06/03/2019 10:00 PM Performed by: [name], MD Authorized by: [name], MD   Consent:    Consent obtained:  Verbal   Consent given by:  Parent   Risks discussed:  Arterial puncture, incorrect placement, infection, pneumothorax and bleeding   Alternatives discussed:  No treatment Universal protocol:    Procedure  explained and questions answered to patient or proxy's satisfaction: yes   Pre-procedure details:    Hand hygiene: Hand hygiene performed prior to insertion     Sterile barrier technique: All elements of maximal sterile technique followed     Skin preparation agent: Skin preparation agent completely dried prior to procedure   Procedure details:    Location:  L femoral   Site selection rationale:  Patient is not cooperative and will likely only need 2 or 3 HD treatments   Patient position:  Flat   Procedural supplies:  Double lumen   Catheter size:  12 Fr   Landmarks identified: yes     Ultrasound guidance: yes     Number of attempts:  2   Successful placement: yes   Post-procedure details:    Post-procedure:  Dressing applied and line sutured   Assessment:  Blood return through all ports Comments:     We started with right IJ, however patient was uncooperative and combative, therefore we decided to place the large bore catheter in the groin. .Sedation  Date/Time: 06/03/2019 9:40 PM Performed by: [name], MD Authorized by: [name], MD   Consent:    Consent obtained:  Verbal and emergent situation   Consent given by:  Parent   Risks discussed:  Allergic reaction, dysrhythmia, inadequate sedation, nausea, prolonged hypoxia resulting in organ damage, prolonged sedation necessitating reversal, respiratory compromise necessitating ventilatory assistance and intubation and vomiting   Alternatives discussed:  Analgesia without sedation, anxiolysis and regional anesthesia Universal protocol:    Procedure explained and questions answered to patient or proxy's satisfaction: yes     Relevant documents present and verified: yes     Imaging studies available: yes     Required blood products, implants, devices, and special equipment available: yes     Immediately prior to procedure a time out was called: yes     Patient identity confirmation method:  Arm band Indications:     Sedation is required to allow for: Hemodialysis catheter placement.   Procedure necessitating sedation performed by:  Physician performing sedation Pre-sedation assessment:    Time since last food or drink:  Greather than 4 hours   ASA classification: class 3 - patient with severe systemic disease     Neck mobility: normal     Mouth opening:  3 or more finger widths   Thyromental distance:  4 finger widths   Mallampati score:  I - soft palate, uvula, fauces, pillars visible   Pre-sedation assessments completed and reviewed: airway patency, cardiovascular function, hydration status,  mental status, nausea/vomiting, pain level, respiratory function and temperature   Immediate pre-procedure details:    Reassessment: Patient reassessed immediately prior to procedure     Reviewed: vital signs, relevant labs/tests and NPO status     Verified: bag valve mask available, emergency equipment available, intubation equipment available, IV patency confirmed, oxygen available and suction available   Procedure details (see MAR for exact dosages):    Preoxygenation:  Nasal cannula   Sedation:  Ketamine and midazolam   Intended level of sedation: deep   Intra-procedure monitoring:  Blood pressure monitoring, cardiac monitor, continuous pulse oximetry, frequent LOC assessments, frequent vital sign checks and continuous capnometry   Intra-procedure events: none     Total Provider sedation time (minutes):  35 Post-procedure details:    Post-sedation assessment completed:  06/04/2019 12:30 AM   Attendance: Constant attendance by certified staff until patient recovered     Recovery: Patient returned to pre-procedure baseline     Post-sedation assessments completed and reviewed: airway patency, cardiovascular function, hydration status, mental status, nausea/vomiting, pain level, respiratory function and temperature     Patient is stable for discharge or admission: yes     Patient tolerance:  Tolerated well, no  immediate complications   (including critical care time)  Medications Ordered in ED Medications  sodium chloride flush (NS) 0.9 % injection 3 mL (3 mLs Intravenous Not Given 06/03/19 2230)  Chlorhexidine Gluconate Cloth 2 % PADS 6 each (6 each Topical Given 06/05/19 0451)  pentafluoroprop-tetrafluoroeth (GEBAUERS) aerosol 1 application (has no administration in time range)  lidocaine (PF) (XYLOCAINE) 1 % injection 5 mL (has no administration in time range)  lidocaine-prilocaine (EMLA) cream 1 application (has no administration in time range)  0.9 %  sodium chloride infusion (has no administration in time range)  0.9 %  sodium chloride infusion (has no administration in time range)  heparin injection 1,000 Units (has no administration in time range)  alteplase (CATHFLO ACTIVASE) injection 2 mg (has no administration in time range)  amLODipine (NORVASC) tablet 10 mg (10 mg Oral Given 06/04/19 1058)  pravastatin (PRAVACHOL) tablet 40 mg (40 mg Oral Given 06/04/19 1709)  nicotine (NICODERM CQ - dosed in mg/24 hours) patch 21 mg (21 mg Transdermal Patch Applied 06/04/19 0911)  perphenazine (TRILAFON) tablet 4 mg (4 mg Oral Given 06/04/19 2149)  insulin glargine (LANTUS) injection 10 Units (10 Units Subcutaneous Given 06/04/19 1057)  pantoprazole (PROTONIX) EC tablet 80 mg (80 mg Oral Given 06/04/19 1057)  ferrous sulfate tablet 325 mg (325 mg Oral Given 06/04/19 1058)  albuterol (PROVENTIL) (2.5 MG/3ML) 0.083% nebulizer solution 2.5 mg (has no administration in time range)  loratadine (CLARITIN) tablet 10 mg (10 mg Oral Given 06/04/19 1057)  enoxaparin (LOVENOX) injection 40 mg (40 mg Subcutaneous Given 06/04/19 0900)  acetaminophen (TYLENOL) tablet 650 mg (has no administration in time range)    Or  acetaminophen (TYLENOL) suppository 650 mg (has no administration in time range)  senna-docusate (Senokot-S) tablet 1 tablet (has no administration in time range)  insulin aspart (novoLOG) injection 0-9  Units (2 Units Subcutaneous Given 06/04/19 1705)  insulin aspart (novoLOG) injection 0-5 Units (0 Units Subcutaneous Not Given 06/04/19 2150)  lidocaine-EPINEPHrine (XYLOCAINE W/EPI) 2 %-1:200000 (PF) injection (has no administration in time range)  midazolam (VERSED) 2 MG/2ML injection (has no administration in time range)  ketamine HCl 50 MG/5ML SOSY (has no administration in time range)  0.9 %  sodium chloride infusion ( Intravenous Rate/Dose Verify 06/05/19 0400)  metoprolol tartrate (LOPRESSOR) tablet  100 mg (100 mg Oral Given 06/04/19 2149)  sodium chloride 0.9 % bolus 1,000 mL (1,000 mLs Intravenous New Bag/Given 06/03/19 2228)  sodium chloride 0.9 % bolus 1,000 mL (1,000 mLs Intravenous New Bag/Given 06/03/19 2229)  midazolam (VERSED) injection 2 mg (2 mg Intravenous Given by Other 06/04/19 0000)  ketamine 50 mg in normal saline 5 mL (10 mg/mL) syringe (80 mg Intravenous Given 06/04/19 0023)  heparin 1000 UNIT/ML injection (3,200 Units  Given 06/04/19 0501)  potassium chloride SA (KLOR-CON) CR tablet 40 mEq (40 mEq Oral Given 06/04/19 1058)    ED Course  I have reviewed the triage vital signs and the nursing notes.  Pertinent labs & imaging results that were available during my care of the patient were reviewed by me and considered in my medical decision making (see chart for details).    MDM Rules/Calculators/A&P                      54 year old comes with a chief complaint of altered mental status.  She has history of depression and mood disorder.  She is on lithium.  During my evaluation it appears that patient is having constellation of symptoms that could be because of lithium toxicity.  COVID-19 also considered.  We ordered the lithium level and it is elevated. There is also mild AKI.  I discussed the case with nephrologist, Dr. Augustin Coupe recommends dialysis.  I will place the dialysis catheter in the ER. Patient has consented for the procedure and I have discussed the need for procedure  to the family who is also allowed [name] to proceed. Patient to be admitted. 2 L of normal saline ordered.    Final Clinical Impression(s) / ED Diagnoses Final diagnoses:  Lithium toxicity, accidental or unintentional, initial encounter  AKI (acute kidney injury) [facility])    Rx / [place] Orders ED Discharge Orders    None       [name], MD 06/05/19 [phone]

## 2019-06-03 NOTE — ED Triage Notes (Signed)
Patient brought in by her son. Patient with altered mental status that started 1 week ago. Patient alert to person, place, disoriented to time.

## 2019-06-03 NOTE — Consult Note (Signed)
Reason for Consult: Lithium toxicity Referring Physician:  Dr. Kathrynn Humble  Chief Complaint: Vomiting and diarrhea  Assessment/Plan: 1. Lithium toxicity - level is 2.21 but likely has acute on chronic toxicity with nausea, vomiting, diarrhea, ataxia, hyperreflexive in the setting of renal failure.  - Will plan on dialyzing her tonight and then re-evaluating in the am to determine whether she will need another treatment for rebound. Would like to get the Li level <1 for at least 6-8 hrs post dialysis. - Appreciate EM placing the dialysis catheter. - Will need records from family to determine if a lower dosage is needed; certainly has e/o chronic toxicity as well 2. Acute Kidney Injury  with a baseline cr of 0.8-0.94 and now 1.75 - may be from prerenal azotemia given history, appears dry on exam. - Urine lytes, strict I&O's, bladder scan PVR; if PVR is insignificant no need for renal ultrasound especially if renal function is improving with hydration. - Urinalysis as well. - Hydration with isotonic fluids as tolerated; again she appears prerenal and she tolerate. 3. Schizoaffective d/o 4. Diastolic dysfunction CHF 5. DM   HPI: [name] is an 54 y.o. female PMH depression schizoaffective d/o ETOH abuse DM here with nausea and ataxia for at least 2 weeks as well as very poor oral intake. She has also had diarrhea with over 3 BM's per day + falls but denies any head trauma. In the ED she was noted to be confused, ataxic, hyperreflexive with a serum Li level of 2.21. She was also noted to be in renal failure with a creatinine of 1.75 (baseine of 0.8-0.94). She denies any dyspnea, cough but has had mild fevers. She denies dysuria, decreased UOP, hematuria, rashes, NSAID use. She also denies foamy urine or hemoptysis.  ROS Pertinent items are noted in HPI.  Chemistry and CBC: Creat  Date/Time Value Ref Range Status  03/29/2017 08:13 AM 0.73 0.50 - 1.10 mg/dL Final  10/04/2014 03:55 PM 0.93  0.50 - 1.10 mg/dL Final  06/23/2014 04:37 PM 0.84 0.50 - 1.10 mg/dL Final  02/10/2014 04:56 PM 0.79 0.50 - 1.10 mg/dL Final  01/01/2014 02:28 PM 0.75 0.50 - 1.10 mg/dL Final  12/24/2013 03:40 PM 0.77 0.50 - 1.10 mg/dL Final  10/21/2013 04:31 PM 0.84 0.50 - 1.10 mg/dL Final  09/30/2013 03:37 PM 0.83 0.50 - 1.10 mg/dL Final  09/23/2013 11:41 AM 0.78 0.50 - 1.10 mg/dL Final  06/10/2013 05:01 PM 0.82 0.50 - 1.10 mg/dL Final  09/16/2012 03:39 PM 0.86 0.50 - 1.10 mg/dL Final  06/12/2012 11:07 AM 0.78 0.50 - 1.10 mg/dL Final  08/10/2011 11:03 AM 0.78 0.50 - 1.10 mg/dL Final  04/04/2011 03:57 PM 0.90 0.50 - 1.10 mg/dL Final  12/27/2010 10:39 AM 0.70 0.50 - 1.10 mg/dL Final   Creatinine, Ser  Date/Time Value Ref Range Status  06/03/2019 07:57 PM 1.75 (H) 0.44 - 1.00 mg/dL Final  02/19/2019 02:24 PM 0.94 0.57 - 1.00 mg/dL Final  11/28/2018 02:02 PM 0.81 0.44 - 1.00 mg/dL Final  11/27/2018 06:59 PM 0.75 0.44 - 1.00 mg/dL Final  09/14/2018 06:29 AM 0.74 0.44 - 1.00 mg/dL Final  09/13/2018 09:11 PM 0.89 0.44 - 1.00 mg/dL Final  06/12/2018 02:16 PM 0.98 0.57 - 1.00 mg/dL Final  08/27/2017 06:09 AM 0.88 0.44 - 1.00 mg/dL Final  08/25/2017 06:14 PM 0.89 0.44 - 1.00 mg/dL Final  08/08/2017 03:06 PM 0.81 0.57 - 1.00 mg/dL Final  04/27/2017 09:04 PM 0.73 0.44 - 1.00 mg/dL Final  02/14/2017 04:03 PM 0.71 0.57 -  1.00 mg/dL Final  01/11/2017 01:20 PM 1.10 (H) 0.44 - 1.00 mg/dL Final  01/09/2017 11:35 AM 0.82 0.44 - 1.00 mg/dL Final  08/28/2016 02:17 PM 0.78 0.44 - 1.00 mg/dL Final  06/14/2016 04:17 PM 1.12 (H) 0.57 - 1.00 mg/dL Final  05/08/2016 02:38 AM 0.94 0.44 - 1.00 mg/dL Final  10/26/2015 04:01 PM 0.96 0.57 - 1.00 mg/dL Final  10/07/2015 03:25 PM 0.83 0.44 - 1.00 mg/dL Final  07/30/2015 10:40 PM 0.84 0.44 - 1.00 mg/dL Final  07/26/2015 01:32 PM 0.80 0.44 - 1.00 mg/dL Final  05/11/2015 04:31 PM 0.81 0.57 - 1.00 mg/dL Final  04/16/2015 07:19 PM 0.70 0.44 - 1.00 mg/dL Final  04/16/2015 07:12 PM  0.79 0.44 - 1.00 mg/dL Final  12/12/2014 02:47 PM 0.80 0.44 - 1.00 mg/dL Final  12/04/2014 09:44 PM 1.03 (H) 0.44 - 1.00 mg/dL Final  10/10/2014 07:49 PM 1.04 (H) 0.44 - 1.00 mg/dL Final  10/02/2014 02:21 PM 0.97 0.44 - 1.00 mg/dL Final  08/12/2014 10:37 AM 0.87 0.57 - 1.00 mg/dL Final  06/10/2014 06:00 PM 0.74 0.50 - 1.10 mg/dL Final  05/20/2014 02:52 PM 0.97 0.50 - 1.10 mg/dL Final  05/14/2014 11:23 AM 0.85 0.50 - 1.10 mg/dL Final  05/04/2014 09:07 PM 0.86 0.50 - 1.10 mg/dL Final  03/14/2014 05:23 PM 0.76 0.50 - 1.10 mg/dL Final  01/08/2014 02:43 PM 0.80 0.50 - 1.10 mg/dL Final  12/17/2013 11:42 PM 0.81 0.50 - 1.10 mg/dL Final  11/30/2013 10:20 AM 0.75 0.50 - 1.10 mg/dL Final  09/16/2013 06:03 AM 0.79 0.50 - 1.10 mg/dL Final  09/15/2013 11:34 PM 0.80 0.50 - 1.10 mg/dL Final  07/22/2013 12:57 AM 1.00 0.50 - 1.10 mg/dL Final  07/22/2013 12:41 AM 0.98 0.50 - 1.10 mg/dL Final  01/23/2013 08:12 AM 0.97 0.50 - 1.10 mg/dL Final  07/13/2012 09:52 PM 0.91 0.50 - 1.10 mg/dL Final  04/30/2012 06:52 AM 0.57 0.50 - 1.10 mg/dL Final  04/29/2012 04:32 AM 0.55 0.50 - 1.10 mg/dL Final  04/28/2012 03:30 AM 0.61 0.50 - 1.10 mg/dL Final  04/27/2012 04:55 AM 0.55 0.50 - 1.10 mg/dL Final  04/26/2012 04:16 AM 0.52 0.50 - 1.10 mg/dL Final  04/25/2012 05:00 AM 0.52 0.50 - 1.10 mg/dL Final  04/24/2012 05:30 AM 0.59 0.50 - 1.10 mg/dL Final  04/23/2012 04:00 AM 0.59 0.50 - 1.10 mg/dL Final  04/22/2012 04:15 AM 0.58 0.50 - 1.10 mg/dL Final   Recent Labs  Lab 06/03/19 1957  NA 133*  K 4.7  CL 103  CO2 19*  GLUCOSE 120*  BUN 24*  CREATININE 1.75*  CALCIUM 10.3   Recent Labs  Lab 06/03/19 1957  WBC 11.2*  HGB 13.0  HCT 41.4  MCV 90.4  PLT 224   Liver Function Tests: Recent Labs  Lab 06/03/19 1957  AST 12*  ALT 10  ALKPHOS 81  BILITOT 0.7  PROT 6.8  ALBUMIN 4.2   Recent Labs  Lab 06/03/19 1957  LIPASE 19   No results for input(s): AMMONIA in the last 168 hours. Cardiac  Enzymes: No results for input(s): CKTOTAL, CKMB, CKMBINDEX, TROPONINI in the last 168 hours. Iron Studies: No results for input(s): IRON, TIBC, TRANSFERRIN, FERRITIN in the last 72 hours. PT/INR: _0 (inr:5)  Xrays/Other Studies: ) Results for orders placed or performed during the hospital encounter of 06/03/19 (from the past 48 hour(s))  CBG monitoring, ED     Status: Abnormal   Collection Time: 06/03/19  7:41 PM  Result Value Ref Range   Glucose-Capillary 117 (H) 70 - 99  mg/dL  Lipase, blood     Status: None   Collection Time: 06/03/19  7:57 PM  Result Value Ref Range   Lipase 19 11 - 51 U/L    Comment: Performed at [facility] [address], [place], [place] [address]  Comprehensive metabolic panel     Status: Abnormal   Collection Time: 06/03/19  7:57 PM  Result Value Ref Range   Sodium 133 (L) 135 - 145 mmol/L   Potassium 4.7 3.5 - 5.1 mmol/L   Chloride 103 98 - 111 mmol/L   CO2 19 (L) 22 - 32 mmol/L   Glucose, Bld 120 (H) 70 - 99 mg/dL   BUN 24 (H) 6 - 20 mg/dL   Creatinine, Ser 1.75 (H) 0.44 - 1.00 mg/dL   Calcium 10.3 8.9 - 10.3 mg/dL   Total Protein 6.8 6.5 - 8.1 g/dL   Albumin 4.2 3.5 - 5.0 g/dL   AST 12 (L) 15 - 41 U/L   ALT 10 0 - 44 U/L   Alkaline Phosphatase 81 38 - 126 U/L   Total Bilirubin 0.7 0.3 - 1.2 mg/dL   GFR calc non Af Amer 33 (L) >60 mL/min   GFR calc Af Amer 38 (L) >60 mL/min   Anion gap 11 5 - 15    Comment: Performed at [facility], [place] [address], [place], [place] [address]  CBC     Status: Abnormal   Collection Time: 06/03/19  7:57 PM  Result Value Ref Range   WBC 11.2 (H) 4.0 - 10.5 K/uL   RBC 4.58 3.87 - 5.11 MIL/uL   Hemoglobin 13.0 12.0 - 15.0 g/dL   HCT 41.4 36.0 - 46.0 %   MCV 90.4 80.0 - 100.0 fL   MCH 28.4 26.0 - 34.0 pg   MCHC 31.4 30.0 - 36.0 g/dL   RDW 15.5 11.5 - 15.5 %   Platelets 224 150 - 400 K/uL   nRBC 0.0 0.0 - 0.2 %    Comment: Performed at [facility], [place] [address], [place],  [place] [address]  Magnesium     Status: None   Collection Time: 06/03/19  7:57 PM  Result Value Ref Range   Magnesium 1.7 1.7 - 2.4 mg/dL    Comment: Performed at [facility], [place] [address] [place], [place] [address]  Lithium level     Status: Abnormal   Collection Time: 06/03/19  9:04 PM  Result Value Ref Range   Lithium Lvl 2.21 (HH) 0.60 - 1.20 mmol/L    Comment: CRITICAL RESULT CALLED TO, READ BACK BY AND VERIFIED WITH: RN G KOPP _0  06/03/19 BY S GEZAHEGN Performed at [facility], [place] [address] [place], [place] [address]    *Note: Due to a large number of results and/or encounters for the requested time period, some results have not been displayed. A complete set of results can be found in Results Review.   No results found.  PMH:   Past Medical History:  Diagnosis Date  . Alcohol abuse   . Anemia, iron deficiency   . Arteriosclerotic cardiovascular disease (ASCVD)    Minimal at cath in [facility].stress nuclear study in 8/08 with nl EF; neg stress echo in 2010  . Community acquired pneumonia 01/03/10, 05/2010, 04/2012   2011; with pleural effusion-hosp [facility] acute resp failure; intubated in Jan 2014 (HMPV pneumonia)  . Depression   . Diabetes mellitus, type 2 ([place]) 2000  Onset in 2000; no insulin  . Diarrhea 10/14/2013   Started 10/10/13, improved with Imodium.   . Diastolic dysfunction    grade 2 per echo 2011  . Dysphagia   . Gastroesophageal reflux disease    Schatzki's ring  . History of alcohol abuse 07/22/2007   Qualifier: Diagnosis of  By: Lenn Cal    . Hyperlipidemia   . Hypertension `   during treatment with Geodon  . Hypokalemia 12/25/2013  . Left knee pain 08/25/2014  . Obesity   . Oral candidiasis 05/16/2017  . PTSD (post-traumatic stress disorder)   . Pulmonary hypertension ([place]) 05/02/2012   Patient needs repeat echo in 06/2012   . Schizoaffective disorder    requiring multiple psychiatric admissions  . Viral URI 05/12/2013  .  Viral wart on finger 10/08/2016  . Vision changes 08/12/2014    PSH:   Past Surgical History:  Procedure Laterality Date  . COLONOSCOPY  01/2006   internal hemorrhoids  . COLONOSCOPY  01/10/2012   Dr. Rourk:Single anal canal hemorrhoidal tag likely source of  trivial hematochezia; right-sided colonic diverticulosis  . DILATION AND CURETTAGE, DIAGNOSTIC / THERAPEUTIC  1992  . ESOPHAGEAL DILATION N/A 08/18/2015   Procedure: ESOPHAGEAL DILATION;  Surgeon: Daneil Dolin, MD;  Location[facility];  Service: Endoscopy;  Laterality: N/A;  . ESOPHAGOGASTRODUODENOSCOPY  09/16/08   Dr. Trevor Iha hiatal hernia/excoriations involving the cardia and mucosa consistent with trauma, antral erosions  of linear petechiae ? gastritis versus early gastric antral vascular  ectasia.Marland Kitchen biopsy showed reactive gastropathy. No H. pylori.  . ESOPHAGOGASTRODUODENOSCOPY  09/2007   Dr. Evalee Mutton ring, dilated to 66 French Maloney dilator, small hiatal hernia, antral erosions, biopsies reactive gastropathy.  . ESOPHAGOGASTRODUODENOSCOPY (EGD) WITH PROPOFOL N/A 12/17/2013   HAL:[id number] antral erosions and petechiae. Small hiatal hernia. No endoscopic explanation for patient's symptoms  . ESOPHAGOGASTRODUODENOSCOPY (EGD) WITH PROPOFOL N/A 08/18/2015   Procedure: ESOPHAGOGASTRODUODENOSCOPY (EGD) WITH PROPOFOL;  Surgeon: Daneil Dolin, MD;  Location: [facility];  Service: Endoscopy;  Laterality: N/A;  0845  . SAVORY DILATION  07/17/2011   Fields-MAC sedation-->distal esophageal stricture s/p dilation, chronic gastritis, multiple ulcers in stomach. no h.pylori    Allergies:  Allergies  Allergen Reactions  . Cephalexin     Trouble breathing, felt like she had bumps in her throat.  . Metronidazole Shortness Of Breath and Swelling  . Orange Itching  . Orange Oil   . Other Itching and Shortness Of Breath    Takes Benadryl before eating shrimp.  . Shrimp [Shellfish Allergy] Shortness Of Breath and Itching    Takes  Benadryl before eating shrimp.  Marland Kitchen Penicillins Hives and Swelling    Has patient had a PCN reaction causing immediate rash, facial/tongue/throat swelling, SOB or lightheadedness with hypotension: Yes Has patient had a PCN reaction causing severe rash involving mucus membranes or skin necrosis: No Has patient had a PCN reaction that required hospitalization No Has patient had a PCN reaction occurring within the last 10 years: Yes If all of the above answers are "NO", then may proceed with Cephalosporin use.    Fever as well  . Sulfonamide Derivatives Hives    fever  . Glipizide Other (See Comments)    psychosis  . Ace Inhibitors Cough    06/2016  . Sulfamethoxazole Rash  . Sulfamethoxazole-Trimethoprim Rash  . Trimethoprim     Medications:   Prior to Admission medications   Medication Sig Start Date End Date Taking? Authorizing Provider  Accu-Chek FastClix Lancets  MISC Use to check you blood sugar 3 times daily 07/29/18   Ina Homes, MD  albuterol (VENTOLIN HFA) 108 (90 Base) MCG/ACT inhaler Inhale 1-2 puffs into the lungs every 6 (six) hours as needed for wheezing or shortness of breath. 09/24/17   Axel Filler, MD  amLODipine (NORVASC) 10 MG tablet Take 1 tablet (10 mg total) by mouth daily. For high blood pressure 12/17/18 12/17/19  Ina Homes, MD  Blood Glucose Monitoring Suppl (ACCU-CHEK GUIDE) w/Device KIT 1 Device by Does not apply route 3 (three) times daily. Use to check blood sugars 3 times daily. 07/29/18   Helberg, Larkin Ina, MD  esomeprazole (NEXIUM) 40 MG capsule TAKE 1 CAPSULE(40 MG) BY MOUTH TWICE DAILY BEFORE A MEAL FOR ACID REFLUX 02/19/19   Ina Homes, MD  ferrous sulfate 325 (65 FE) MG tablet Take 1 tablet (325 mg total) by mouth every other day. (May buy from over the counter): For anemia 09/24/17   Axel Filler, MD  glucose blood (ACCU-CHEK GUIDE) test strip Use as instructed 07/29/18   Ina Homes, MD  Insulin Glargine (LANTUS) 100 UNIT/ML  Solostar Pen Inject 19 Units into the skin daily. 02/19/19   Helberg, Larkin Ina, MD  Insulin Pen Needle (PEN NEEDLES) 31G X 5 MM MISC Inject 18 Units into the skin at bedtime. Use to inject insulin daily at bedtime: For diabetes management: 02/19/19   Ina Homes, MD  loratadine (CLARITIN) 10 MG tablet Take 1 tablet (10 mg total) by mouth daily. (May buy from over the counter): For allergies 07/25/18   Neva Seat, MD  losartan (COZAAR) 100 MG tablet Take 1 tablet (100 mg total) by mouth daily. For high blood pressure 09/04/18   Helberg, Larkin Ina, MD  lovastatin (MEVACOR) 40 MG tablet Take 1 tablet (40 mg total) by mouth daily. Patient taking differently: Take 40 mg by mouth every evening.  09/04/18 12/03/18  Ina Homes, MD  metFORMIN (GLUCOPHAGE) 1000 MG tablet Take 1 tablet (1,000 mg total) by mouth 2 (two) times daily with a meal. For diabetes management 09/04/18   Ina Homes, MD  metoprolol tartrate (LOPRESSOR) 100 MG tablet Take 1 tablet (100 mg total) by mouth 2 (two) times daily. For high blood pressure 09/04/18   Helberg, Larkin Ina, MD  nicotine (NICODERM CQ - DOSED IN MG/24 HOURS) 21 mg/24hr patch Place 1 patch (21 mg total) onto the skin daily. 02/19/19   Ina Homes, MD  perphenazine (TRILAFON) 4 MG tablet 1 in am 2 at h s Patient taking differently: Take 4 mg by mouth 3 (three) times daily. Take one tablet in the morning, then take two in the evening 09/17/18   Johnn Hai, MD  spironolactone (ALDACTONE) 50 MG tablet Take 1 tablet (50 mg total) by mouth daily. For high blood pressure 09/04/18   Helberg, Larkin Ina, MD  temazepam (RESTORIL) 30 MG capsule Take 1 capsule (30 mg total) by mouth at bedtime. 09/17/18   Johnn Hai, MD  traZODone (DESYREL) 100 MG tablet Take 1 tablet (100 mg total) by mouth at bedtime as needed for sleep. For sleep 09/24/17   Axel Filler, MD    Discontinued Meds:   Medications Discontinued During This Encounter  Medication Reason  . lactated ringers  bolus 2,000 mL   . lactated ringers bolus 1,000 mL     Social History:  reports that she has been smoking cigarettes. She started smoking about 7 years ago. She has a 22.50 pack-year smoking history. She has never used smokeless tobacco. She  reports that she does not drink alcohol or use drugs.  Family History:   Family History  Problem Relation Age of Onset  . Hypertension Mother   . Stroke Father        deceased at age 17  . Colon cancer Other   . Heart disease Sister   . Diabetes Other   . High Cholesterol Other   . Arthritis Other   . Anesthesia problems Neg Hx   . Hypotension Neg Hx   . Malignant hyperthermia Neg Hx   . Pseudochol deficiency Neg Hx     Blood pressure 114/88, pulse 100, temperature 98.2 F (36.8 C), temperature source Oral, resp. rate 16, weight 89.9 kg, SpO2 96 %. General appearance: alert and cooperative Head: Normocephalic, without obvious abnormality, atraumatic Eyes: negative Back: symmetric, no curvature. ROM normal. No CVA tenderness. Resp: clear to auscultation bilaterally Cardio: S1, S2 normal GI: soft, non-tender; bowel sounds normal; no masses,  no organomegaly Extremities: extremities normal, atraumatic, no cyanosis or edema Pulses: 2+ and symmetric Skin: Skin color, texture, turgor normal. No rashes or lesions Lymph nodes: Cervical adenopathy: none Neurologic: Mental status: alertness: alert Motor: grossly normal Reflexes: hyperrelexive Gait: Ataxic       Dwana Melena, MD 06/03/2019, 11:18 PM

## 2019-06-03 NOTE — H&P (Signed)
- History and Physical    [name]:[id number] DOB: [birth year] DOA: 06/03/2019  PCP: Ina Homes, MD  Patient coming from: Home   Chief Complaint: AMS  HPI: [name] is a 54 y.o. female with medical history significant of Schizoaffective disorder, Depression, PTSD, HTN, HLD, T2DM, Pulmonary HTN, Alcohol and Tobacco use who presents with three weeks of cognitive and funcitonal change/decline.  Patient is too altered at this time to provide hx.  Chart reviewed, Dr. Andree Elk hx obtained from patient's sister revealed that patient has change to behavior, poor PO intake in setting of new onset nausea, and new gait ataxia for the past two weeks with a hx multiple falls but not reported to have head trauma from these falls.  Patient has supposedly been taking her medications.  Patient initially altered unable to provide hx.  Re-examined during HD, now alert and more appropriate and responsive.  She denies any pain or discomfort.  She does state she was not eating well recently.  She is aware she is at [facility] and repeatedly asks to return home.  She denies any fevers, chills, abdominal pain.  No nausea reported at this time.  Review of Systems: As per HPI otherwise 10 point review of systems negative.    Past Medical History:  Diagnosis Date  . Alcohol abuse   . Anemia, iron deficiency   . Arteriosclerotic cardiovascular disease (ASCVD)    Minimal at cath in [facility].stress nuclear study in 8/08 with nl EF; neg stress echo in 2010  . Community acquired pneumonia 01/03/10, 05/2010, 04/2012   2011; with pleural effusion-hosp [facility] acute resp failure; intubated in Jan 2014 (HMPV pneumonia)  . Depression   . Diabetes mellitus, type 2 ([place]) 2000   Onset in 2000; no insulin  . Diarrhea 10/14/2013   Started 10/10/13, improved with Imodium.   . Diastolic dysfunction    grade 2 per echo 2011  . Dysphagia   . Gastroesophageal reflux disease    Schatzki's ring  .  History of alcohol abuse 07/22/2007   Qualifier: Diagnosis of  By: Lenn Cal    . Hyperlipidemia   . Hypertension `   during treatment with Geodon  . Hypokalemia 12/25/2013  . Left knee pain 08/25/2014  . Obesity   . Oral candidiasis 05/16/2017  . PTSD (post-traumatic stress disorder)   . Pulmonary hypertension ([place]) 05/02/2012   Patient needs repeat echo in 06/2012   . Schizoaffective disorder    requiring multiple psychiatric admissions  . Viral URI 05/12/2013  . Viral wart on finger 10/08/2016  . Vision changes 08/12/2014    Past Surgical History:  Procedure Laterality Date  . COLONOSCOPY  01/2006   internal hemorrhoids  . COLONOSCOPY  01/10/2012   Dr. Rourk:Single anal canal hemorrhoidal tag likely source of  trivial hematochezia; right-sided colonic diverticulosis  . DILATION AND CURETTAGE, DIAGNOSTIC / THERAPEUTIC  1992  . ESOPHAGEAL DILATION N/A 08/18/2015   Procedure: ESOPHAGEAL DILATION;  Surgeon: Daneil Dolin, MD;  Location: [facility];  Service: Endoscopy;  Laterality: N/A;  . ESOPHAGOGASTRODUODENOSCOPY  09/16/08   Dr. Trevor Iha hiatal hernia/excoriations involving the cardia and mucosa consistent with trauma, antral erosions  of linear petechiae ? gastritis versus early gastric antral vascular  ectasia.Marland Kitchen biopsy showed reactive gastropathy. No H. pylori.  . ESOPHAGOGASTRODUODENOSCOPY  09/2007   Dr. Evalee Mutton ring, dilated to 75 French Maloney dilator, small hiatal hernia, antral erosions, biopsies reactive gastropathy.  . ESOPHAGOGASTRODUODENOSCOPY (EGD) WITH PROPOFOL N/A 12/17/2013  EVO:[id number] antral erosions and petechiae. Small hiatal hernia. No endoscopic explanation for patient's symptoms  . ESOPHAGOGASTRODUODENOSCOPY (EGD) WITH PROPOFOL N/A 08/18/2015   Procedure: ESOPHAGOGASTRODUODENOSCOPY (EGD) WITH PROPOFOL;  Surgeon: Daneil Dolin, MD;  Location: [facility];  Service: Endoscopy;  Laterality: N/A;  0845  . SAVORY DILATION  07/17/2011   Fields-MAC  sedation-->distal esophageal stricture s/p dilation, chronic gastritis, multiple ulcers in stomach. no h.pylori     reports that she has been smoking cigarettes. She started smoking about 7 years ago. She has a 22.50 pack-year smoking history. She has never used smokeless tobacco. She reports that she does not drink alcohol or use drugs.  Allergies  Allergen Reactions  . Cephalexin     Trouble breathing, felt like she had bumps in her throat.  . Metronidazole Shortness Of Breath and Swelling  . Orange Itching  . Orange Oil   . Other Itching and Shortness Of Breath    Takes Benadryl before eating shrimp.  . Shrimp [Shellfish Allergy] Shortness Of Breath and Itching    Takes Benadryl before eating shrimp.  Marland Kitchen Penicillins Hives and Swelling    Has patient had a PCN reaction causing immediate rash, facial/tongue/throat swelling, SOB or lightheadedness with hypotension: Yes Has patient had a PCN reaction causing severe rash involving mucus membranes or skin necrosis: No Has patient had a PCN reaction that required hospitalization No Has patient had a PCN reaction occurring within the last 10 years: Yes If all of the above answers are "NO", then may proceed with Cephalosporin use.    Fever as well  . Sulfonamide Derivatives Hives    fever  . Glipizide Other (See Comments)    psychosis  . Ace Inhibitors Cough    06/2016  . Sulfamethoxazole Rash  . Sulfamethoxazole-Trimethoprim Rash  . Trimethoprim     Family History  Problem Relation Age of Onset  . Hypertension Mother   . Stroke Father        deceased at age 33  . Colon cancer Other   . Heart disease Sister   . Diabetes Other   . High Cholesterol Other   . Arthritis Other   . Anesthesia problems Neg Hx   . Hypotension Neg Hx   . Malignant hyperthermia Neg Hx   . Pseudochol deficiency Neg Hx      Prior to Admission medications   Medication Sig Start Date End Date Taking? Authorizing Provider  Accu-Chek FastClix Lancets  MISC Use to check you blood sugar 3 times daily 07/29/18   Helberg, Larkin Ina, MD  albuterol (VENTOLIN HFA) 108 (90 Base) MCG/ACT inhaler Inhale 1-2 puffs into the lungs every 6 (six) hours as needed for wheezing or shortness of breath. 09/24/17   Axel Filler, MD  amLODipine (NORVASC) 10 MG tablet Take 1 tablet (10 mg total) by mouth daily. For high blood pressure 12/17/18 12/17/19  Ina Homes, MD  Blood Glucose Monitoring Suppl (ACCU-CHEK GUIDE) w/Device KIT 1 Device by Does not apply route 3 (three) times daily. Use to check blood sugars 3 times daily. 07/29/18   Helberg, Larkin Ina, MD  esomeprazole (NEXIUM) 40 MG capsule TAKE 1 CAPSULE(40 MG) BY MOUTH TWICE DAILY BEFORE A MEAL FOR ACID REFLUX 02/19/19   Ina Homes, MD  ferrous sulfate 325 (65 FE) MG tablet Take 1 tablet (325 mg total) by mouth every other day. (May buy from over the counter): For anemia 09/24/17   Axel Filler, MD  glucose blood (ACCU-CHEK GUIDE) test strip Use as instructed  07/29/18   Ina Homes, MD  Insulin Glargine (LANTUS) 100 UNIT/ML Solostar Pen Inject 19 Units into the skin daily. 02/19/19   Helberg, Larkin Ina, MD  Insulin Pen Needle (PEN NEEDLES) 31G X 5 MM MISC Inject 18 Units into the skin at bedtime. Use to inject insulin daily at bedtime: For diabetes management: 02/19/19   Ina Homes, MD  loratadine (CLARITIN) 10 MG tablet Take 1 tablet (10 mg total) by mouth daily. (May buy from over the counter): For allergies 07/25/18   Neva Seat, MD  losartan (COZAAR) 100 MG tablet Take 1 tablet (100 mg total) by mouth daily. For high blood pressure 09/04/18   Helberg, Larkin Ina, MD  lovastatin (MEVACOR) 40 MG tablet Take 1 tablet (40 mg total) by mouth daily. Patient taking differently: Take 40 mg by mouth every evening.  09/04/18 12/03/18  Ina Homes, MD  metFORMIN (GLUCOPHAGE) 1000 MG tablet Take 1 tablet (1,000 mg total) by mouth 2 (two) times daily with a meal. For diabetes management 09/04/18   Ina Homes, MD  metoprolol tartrate (LOPRESSOR) 100 MG tablet Take 1 tablet (100 mg total) by mouth 2 (two) times daily. For high blood pressure 09/04/18   Helberg, Larkin Ina, MD  nicotine (NICODERM CQ - DOSED IN MG/24 HOURS) 21 mg/24hr patch Place 1 patch (21 mg total) onto the skin daily. 02/19/19   Ina Homes, MD  perphenazine (TRILAFON) 4 MG tablet 1 in am 2 at h s Patient taking differently: Take 4 mg by mouth 3 (three) times daily. Take one tablet in the morning, then take two in the evening 09/17/18   Johnn Hai, MD  spironolactone (ALDACTONE) 50 MG tablet Take 1 tablet (50 mg total) by mouth daily. For high blood pressure 09/04/18   Helberg, Larkin Ina, MD  temazepam (RESTORIL) 30 MG capsule Take 1 capsule (30 mg total) by mouth at bedtime. 09/17/18   Johnn Hai, MD  traZODone (DESYREL) 100 MG tablet Take 1 tablet (100 mg total) by mouth at bedtime as needed for sleep. For sleep 09/24/17   Axel Filler, MD    Physical Exam: Vitals:   06/03/19 1942 06/03/19 1948  BP: 114/88   Pulse: 100   Resp: 16   Temp: 98.2 F (36.8 C)   TempSrc: Oral   SpO2: 96%   Weight:  89.9 kg    Vitals:   06/03/19 1942 06/03/19 1948  BP: 114/88   Pulse: 100   Resp: 16   Temp: 98.2 F (36.8 C)   TempSrc: Oral   SpO2: 96%   Weight:  89.9 kg     Constitutional: NAD, calm, comfortable, alert and oriented to self and place (during HD) Eyes: PERRL, lids and conjunctivae normal, EOMI ENMT: Mucous membranes are moist. Posterior pharynx clear of any exudate or lesions.Normal dentition.  Neck: normal, supple, no masses, no thyromegaly Respiratory: clear to auscultation bilaterally, no wheezing, no crackles. Normal respiratory effort. No accessory muscle use.  Cardiovascular: Regular rate and rhythm, no murmurs / rubs / gallops. No extremity edema. 2+ pedal pulses. No carotid bruits.  Abdomen: no tenderness, no masses palpated. No hepatosplenomegaly. Bowel sounds positive.  Musculoskeletal: no clubbing  / cyanosis. No joint deformity upper and lower extremities. Good ROM, no contractures. Normal muscle tone.  Skin: no rashes, lesions, ulcers. No induration.   Neurologic: CN 2-12 grossly intact. Sensation intact, moving all extremities Psychiatric: Guarded judgment and insight   Labs on Admission: I have personally reviewed following labs and imaging studies  CBC: Recent Labs  Lab 06/03/19 1957  WBC 11.2*  HGB 13.0  HCT 41.4  MCV 90.4  PLT 865   Basic Metabolic Panel: Recent Labs  Lab 06/03/19 1957  NA 133*  K 4.7  CL 103  CO2 19*  GLUCOSE 120*  BUN 24*  CREATININE 1.75*  CALCIUM 10.3  MG 1.7   GFR: Estimated Creatinine Clearance: 41.3 mL/min (A) (by C-G formula based on SCr of 1.75 mg/dL (H)). Liver Function Tests: Recent Labs  Lab 06/03/19 1957  AST 12*  ALT 10  ALKPHOS 81  BILITOT 0.7  PROT 6.8  ALBUMIN 4.2   Recent Labs  Lab 06/03/19 1957  LIPASE 19   No results for input(s): AMMONIA in the last 168 hours. Coagulation Profile: No results for input(s): INR, PROTIME in the last 168 hours. Cardiac Enzymes: No results for input(s): CKTOTAL, CKMB, CKMBINDEX, TROPONINI in the last 168 hours. BNP (last 3 results) No results for input(s): PROBNP in the last 8760 hours. HbA1C: No results for input(s): HGBA1C in the last 72 hours. CBG: Recent Labs  Lab 06/03/19 1650 06/03/19 1941  GLUCAP 118* 117*   Lipid Profile: No results for input(s): CHOL, HDL, LDLCALC, TRIG, CHOLHDL, LDLDIRECT in the last 72 hours. Thyroid Function Tests: No results for input(s): TSH, T4TOTAL, FREET4, T3FREE, THYROIDAB in the last 72 hours. Anemia Panel: No results for input(s): VITAMINB12, FOLATE, FERRITIN, TIBC, IRON, RETICCTPCT in the last 72 hours. Urine analysis:    Component Value Date/Time   COLORURINE YELLOW 11/27/2018 2308   APPEARANCEUR Clear 02/19/2019 1620   LABSPEC 1.027 11/27/2018 2308   PHURINE 5.0 11/27/2018 2308   GLUCOSEU Negative 02/19/2019 1620    GLUCOSEU >1000 01/02/2010 0000   HGBUR NEGATIVE 11/27/2018 2308   HGBUR negative 09/22/2009 1513   BILIRUBINUR Negative 02/19/2019 1620   KETONESUR 5 (A) 11/27/2018 2308   PROTEINUR Negative 02/19/2019 1620   PROTEINUR 100 (A) 11/27/2018 2308   UROBILINOGEN 0.2 05/10/2018 1405   NITRITE Negative 02/19/2019 1620   NITRITE NEGATIVE 11/27/2018 2308   LEUKOCYTESUR Negative 02/19/2019 1620   LEUKOCYTESUR NEGATIVE 11/27/2018 2308    Radiological Exams on Admission: No results found.    Assessment/Plan PAISLI SILFIES is a 54 y.o. female with medical history significant of Schizoaffective disorder, Depression, PTSD, HTN, HLD, T2DM, Pulmonary HTN, Alcohol and Tobacco use who presents with three weeks of cognitive and funcitonal change/decline likely from acute on chronic lithium toxicity.  # Acute/Subacute toxic encephalopathy # Lithium Toxicity - case d/w [place] [facility] - patient recommended for HD due to Neuro symptoms; subsequently Dr. Augustin Coupe of Nephrology consulted and will plan for HD.  Dr. Kathrynn Humble placing HD catheter. - level is 2.21, felt to be acute on chronic toxicity with findings of nausea/vomiting/ataxia and hyperrelfexia concurrent with AMS - will obtain [facility] as well (though per collateral no head trauma with her ataxia and falls)  - HD goals per Nephrology are to bring Li level < 1 for at least 6-8 hrs post HD, patient may have rebound per Nephro - repeat level post HD - SLP eval  # AKI - suspect pre-renal in setting of poor intake recently, dry on exam - patient to have HD, has already had 2 L of NS - PVR to be checked, if not retaining can monitor function otherwise if elevated would get formal bladder and renal U/S - monitor I/O - held losartan and Spironolactone  # Schizoaffective disorder # Depression - held agents for now while patient is altered - home meds including  lithium, temazepam and trazodone  # T2DM/IDDM - dose reduced Lantus 10 U, monitor CBGs -  ISS - home dose is 19 U  # HTN - continue amlodipine, metoprolol (switched BID dosing to q6 and consolidate as tolerated) - held losartan, spironolactone  # GERD - on esomeprazole at home, transitioned to pantroprazole  # HLD - continue statin  # IDA - previously anemic, now wnl - continue iron supplementation  DVT prophylaxis: Lovenox Code Status: Full Disposition Plan: pending Consults called: Nephrology, Dr. Augustin Coupe Admission status: SDU   Truddie Hidden MD [facility] Pager [phone]  If 7PM-7AM, please contact night-coverage [email or web address] Password [email or web address]  06/03/2019, 11:03 PM

## 2019-06-03 NOTE — ED Triage Notes (Signed)
The pt is here with family  The pt cannot answer any  Questions asked  According to family at the bedside the pt ahd not been well for several days  The pt is a daibetic she has lt foot pain  She lives in [place] with a sister  She was at [employer] earlier today and left there to come here.  The pt cannot tell me very much about anything  The pt is unable to stand without assistance  She has been falling around also  Her psy meds have been changed and adjusted.. she has no appetite not drinking anything

## 2019-06-04 ENCOUNTER — Inpatient Hospital Stay (HOSPITAL_COMMUNITY): Payer: Medicare Other

## 2019-06-04 DIAGNOSIS — R296 Repeated falls: Secondary | ICD-10-CM

## 2019-06-04 DIAGNOSIS — F17200 Nicotine dependence, unspecified, uncomplicated: Secondary | ICD-10-CM

## 2019-06-04 DIAGNOSIS — T56891A Toxic effect of other metals, accidental (unintentional), initial encounter: Secondary | ICD-10-CM

## 2019-06-04 DIAGNOSIS — G92 Toxic encephalopathy: Secondary | ICD-10-CM

## 2019-06-04 DIAGNOSIS — F251 Schizoaffective disorder, depressive type: Secondary | ICD-10-CM

## 2019-06-04 DIAGNOSIS — I5032 Chronic diastolic (congestive) heart failure: Secondary | ICD-10-CM

## 2019-06-04 DIAGNOSIS — E1165 Type 2 diabetes mellitus with hyperglycemia: Secondary | ICD-10-CM

## 2019-06-04 DIAGNOSIS — N179 Acute kidney failure, unspecified: Secondary | ICD-10-CM

## 2019-06-04 DIAGNOSIS — E119 Type 2 diabetes mellitus without complications: Secondary | ICD-10-CM

## 2019-06-04 DIAGNOSIS — Z794 Long term (current) use of insulin: Secondary | ICD-10-CM

## 2019-06-04 DIAGNOSIS — I10 Essential (primary) hypertension: Secondary | ICD-10-CM

## 2019-06-04 LAB — CK: Total CK: 25 U/L — ABNORMAL LOW (ref 38–234)

## 2019-06-04 LAB — URINALYSIS, COMPLETE (UACMP) WITH MICROSCOPIC
Bilirubin Urine: NEGATIVE
Glucose, UA: NEGATIVE mg/dL
Hgb urine dipstick: NEGATIVE
Ketones, ur: 20 mg/dL — AB
Leukocytes,Ua: NEGATIVE
Nitrite: NEGATIVE
Protein, ur: NEGATIVE mg/dL
Specific Gravity, Urine: 1.021 (ref 1.005–1.030)
pH: 5 (ref 5.0–8.0)

## 2019-06-04 LAB — SODIUM, URINE, RANDOM: Sodium, Ur: 106 mmol/L

## 2019-06-04 LAB — GLUCOSE, CAPILLARY
Glucose-Capillary: 160 mg/dL — ABNORMAL HIGH (ref 70–99)
Glucose-Capillary: 144 mg/dL — ABNORMAL HIGH (ref 70–99)
Glucose-Capillary: 189 mg/dL — ABNORMAL HIGH (ref 70–99)
Glucose-Capillary: 143 mg/dL — ABNORMAL HIGH (ref 70–99)

## 2019-06-04 LAB — RENAL FUNCTION PANEL
Albumin: 3.7 g/dL (ref 3.5–5.0)
Anion gap: 12 (ref 5–15)
BUN: 8 mg/dL (ref 6–20)
CO2: 22 mmol/L (ref 22–32)
Calcium: 8.4 mg/dL — ABNORMAL LOW (ref 8.9–10.3)
Chloride: 103 mmol/L (ref 98–111)
Creatinine, Ser: 1.14 mg/dL — ABNORMAL HIGH (ref 0.44–1.00)
GFR calc Af Amer: >60 mL/min (ref >60)
GFR calc non Af Amer: 56 mL/min — ABNORMAL LOW (ref >60)
Glucose, Bld: 172 mg/dL — ABNORMAL HIGH (ref 70–99)
Phosphorus: 2.3 mg/dL — ABNORMAL LOW (ref 2.5–4.6)
Potassium: 3.4 mmol/L — ABNORMAL LOW (ref 3.5–5.1)
Sodium: 137 mmol/L (ref 135–145)

## 2019-06-04 LAB — PROTIME-INR
INR: 1.1 (ref 0.8–1.2)
INR: 1 (ref 0.8–1.2)
Prothrombin Time: 14.4 seconds (ref 11.4–15.2)
Prothrombin Time: 13.5 seconds (ref 11.4–15.2)

## 2019-06-04 LAB — HEMOGLOBIN A1C
Hgb A1c MFr Bld: 7.4 % — ABNORMAL HIGH (ref 4.8–5.6)
Mean Plasma Glucose: 165.68 mg/dL

## 2019-06-04 LAB — CBC
HCT: 36.7 % (ref 36.0–46.0)
Hemoglobin: 11.7 g/dL — ABNORMAL LOW (ref 12.0–15.0)
MCH: 28.3 pg (ref 26.0–34.0)
MCHC: 31.9 g/dL (ref 30.0–36.0)
MCV: 88.9 fL (ref 80.0–100.0)
Platelets: 155 10*3/uL (ref 150–400)
RBC: 4.13 MIL/uL (ref 3.87–5.11)
RDW: 15.4 % (ref 11.5–15.5)
WBC: 11.9 10*3/uL — ABNORMAL HIGH (ref 4.0–10.5)
nRBC: 0 % (ref 0.0–0.2)

## 2019-06-04 LAB — HEPATITIS B SURFACE ANTIBODY,QUALITATIVE: Hep B S Ab: NONREACTIVE

## 2019-06-04 LAB — COMPREHENSIVE METABOLIC PANEL
ALT: 11 U/L (ref 0–44)
AST: 12 U/L — ABNORMAL LOW (ref 15–41)
Albumin: 3.6 g/dL (ref 3.5–5.0)
Alkaline Phosphatase: 86 U/L (ref 38–126)
Anion gap: 12 (ref 5–15)
BUN: 8 mg/dL (ref 6–20)
CO2: 22 mmol/L (ref 22–32)
Calcium: 8.3 mg/dL — ABNORMAL LOW (ref 8.9–10.3)
Chloride: 102 mmol/L (ref 98–111)
Creatinine, Ser: 1.2 mg/dL — ABNORMAL HIGH (ref 0.44–1.00)
GFR calc Af Amer: >60 mL/min (ref >60)
GFR calc non Af Amer: 52 mL/min — ABNORMAL LOW (ref >60)
Glucose, Bld: 174 mg/dL — ABNORMAL HIGH (ref 70–99)
Potassium: 3.4 mmol/L — ABNORMAL LOW (ref 3.5–5.1)
Sodium: 136 mmol/L (ref 135–145)
Total Bilirubin: 1.6 mg/dL — ABNORMAL HIGH (ref 0.3–1.2)
Total Protein: 6 g/dL — ABNORMAL LOW (ref 6.5–8.1)

## 2019-06-04 LAB — HIV ANTIBODY (ROUTINE TESTING W REFLEX): HIV Screen 4th Generation wRfx: NONREACTIVE

## 2019-06-04 LAB — LITHIUM LEVEL
Lithium Lvl: 0.92 mmol/L (ref 0.60–1.20)
Lithium Lvl: 1.06 mmol/L (ref 0.60–1.20)

## 2019-06-04 LAB — HEPATITIS B SURFACE ANTIGEN: Hepatitis B Surface Ag: NONREACTIVE

## 2019-06-04 LAB — CREATININE, URINE, RANDOM: Creatinine, Urine: 246.29 mg/dL

## 2019-06-04 LAB — HEPATITIS B CORE ANTIBODY, TOTAL: Hep B Core Total Ab: NONREACTIVE

## 2019-06-04 LAB — APTT: aPTT: 21 seconds — ABNORMAL LOW (ref 24–36)

## 2019-06-04 MED ORDER — METOPROLOL TARTRATE 100 MG PO TABS
100.0000 mg | ORAL_TABLET | Freq: Two times a day (BID) | ORAL | Status: DC
  Administered 2019-06-04 – 2019-06-05 (×2): 100 mg via ORAL
  Filled 2019-06-04 (×2): qty 1

## 2019-06-04 MED ORDER — ALTEPLASE 2 MG IJ SOLR: 2.0000 mg | Freq: Once | INTRAMUSCULAR | Status: DC | PRN

## 2019-06-04 MED ORDER — PANTOPRAZOLE SODIUM 40 MG PO TBEC
80.0000 mg | DELAYED_RELEASE_TABLET | Freq: Every day | ORAL | Status: DC
  Administered 2019-06-04 – 2019-06-05 (×2): 80 mg via ORAL
  Filled 2019-06-04 (×2): qty 2

## 2019-06-04 MED ORDER — LIDOCAINE-PRILOCAINE 2.5-2.5 % EX CREA: 1.0000 "application " | TOPICAL_CREAM | CUTANEOUS | Status: DC | PRN

## 2019-06-04 MED ORDER — NICOTINE 21 MG/24HR TD PT24
21.0000 mg | MEDICATED_PATCH | Freq: Every day | TRANSDERMAL | Status: DC
  Administered 2019-06-04 – 2019-06-05 (×2): 21 mg via TRANSDERMAL
  Filled 2019-06-04 (×2): qty 1

## 2019-06-04 MED ORDER — SENNOSIDES-DOCUSATE SODIUM 8.6-50 MG PO TABS: 1.0000 | ORAL_TABLET | Freq: Every evening | ORAL | Status: DC | PRN

## 2019-06-04 MED ORDER — MIDAZOLAM HCL 2 MG/2ML IJ SOLN
2.0000 mg | Freq: Once | INTRAMUSCULAR | Status: AC
  Administered 2019-06-04: 2 mg via INTRAVENOUS

## 2019-06-04 MED ORDER — LORATADINE 10 MG PO TABS
10.0000 mg | ORAL_TABLET | Freq: Every day | ORAL | Status: DC
  Administered 2019-06-04 – 2019-06-05 (×2): 10 mg via ORAL
  Filled 2019-06-04 (×2): qty 1

## 2019-06-04 MED ORDER — INSULIN ASPART 100 UNIT/ML ~~LOC~~ SOLN
0.0000 [IU] | Freq: Three times a day (TID) | SUBCUTANEOUS | Status: DC
  Administered 2019-06-04: 13:00:00 1 [IU] via SUBCUTANEOUS
  Administered 2019-06-04 – 2019-06-05 (×3): 2 [IU] via SUBCUTANEOUS
  Administered 2019-06-05: 1 [IU] via SUBCUTANEOUS

## 2019-06-04 MED ORDER — KETAMINE HCL 50 MG/5ML IJ SOSY
80.0000 mg | PREFILLED_SYRINGE | Freq: Once | INTRAMUSCULAR | Status: AC
  Administered 2019-06-04: 80 mg via INTRAVENOUS

## 2019-06-04 MED ORDER — INSULIN ASPART 100 UNIT/ML ~~LOC~~ SOLN: 0.0000 [IU] | Freq: Every day | SUBCUTANEOUS | Status: DC

## 2019-06-04 MED ORDER — KETAMINE HCL 50 MG/5ML IJ SOSY
PREFILLED_SYRINGE | INTRAMUSCULAR | Status: AC
  Filled 2019-06-04: qty 10

## 2019-06-04 MED ORDER — FERROUS SULFATE 325 (65 FE) MG PO TABS
325.0000 mg | ORAL_TABLET | ORAL | Status: DC
  Administered 2019-06-04: 11:00:00 325 mg via ORAL
  Filled 2019-06-04: qty 1

## 2019-06-04 MED ORDER — MIDAZOLAM HCL 2 MG/2ML IJ SOLN
INTRAMUSCULAR | Status: AC
  Filled 2019-06-04: qty 2

## 2019-06-04 MED ORDER — ENOXAPARIN SODIUM 40 MG/0.4ML ~~LOC~~ SOLN
40.0000 mg | SUBCUTANEOUS | Status: DC
  Administered 2019-06-04 – 2019-06-05 (×2): 40 mg via SUBCUTANEOUS
  Filled 2019-06-04 (×2): qty 0.4

## 2019-06-04 MED ORDER — HEPARIN SODIUM (PORCINE) 1000 UNIT/ML DIALYSIS: 1000.0000 [IU] | INTRAMUSCULAR | Status: DC | PRN

## 2019-06-04 MED ORDER — AMLODIPINE BESYLATE 10 MG PO TABS
10.0000 mg | ORAL_TABLET | Freq: Every day | ORAL | Status: DC
  Administered 2019-06-04 – 2019-06-05 (×2): 10 mg via ORAL
  Filled 2019-06-04 (×2): qty 1

## 2019-06-04 MED ORDER — ALBUTEROL SULFATE (2.5 MG/3ML) 0.083% IN NEBU: 2.5000 mg | INHALATION_SOLUTION | Freq: Four times a day (QID) | RESPIRATORY_TRACT | Status: DC | PRN

## 2019-06-04 MED ORDER — PENTAFLUOROPROP-TETRAFLUOROETH EX AERO: 1.0000 "application " | INHALATION_SPRAY | CUTANEOUS | Status: DC | PRN

## 2019-06-04 MED ORDER — METOPROLOL TARTRATE 50 MG PO TABS
50.0000 mg | ORAL_TABLET | Freq: Four times a day (QID) | ORAL | Status: DC
  Administered 2019-06-04 (×2): 50 mg via ORAL
  Filled 2019-06-04: qty 1

## 2019-06-04 MED ORDER — SODIUM CHLORIDE 0.9 % IV SOLN: INTRAVENOUS | Status: DC

## 2019-06-04 MED ORDER — ACETAMINOPHEN 650 MG RE SUPP: 650.0000 mg | Freq: Four times a day (QID) | RECTAL | Status: DC | PRN

## 2019-06-04 MED ORDER — SODIUM CHLORIDE 0.9 % IV SOLN: 100.0000 mL | INTRAVENOUS | Status: DC | PRN

## 2019-06-04 MED ORDER — HEPARIN SODIUM (PORCINE) 1000 UNIT/ML IJ SOLN
INTRAMUSCULAR | Status: AC
  Administered 2019-06-04: 05:00:00 3200 [IU]
  Filled 2019-06-04: qty 4

## 2019-06-04 MED ORDER — INSULIN GLARGINE 100 UNIT/ML ~~LOC~~ SOLN
10.0000 [IU] | Freq: Every day | SUBCUTANEOUS | Status: DC
  Administered 2019-06-04 – 2019-06-05 (×2): 10 [IU] via SUBCUTANEOUS
  Filled 2019-06-04 (×2): qty 0.1

## 2019-06-04 MED ORDER — PRAVASTATIN SODIUM 40 MG PO TABS
40.0000 mg | ORAL_TABLET | Freq: Every day | ORAL | Status: DC
  Administered 2019-06-04: 40 mg via ORAL
  Filled 2019-06-04: qty 1

## 2019-06-04 MED ORDER — PERPHENAZINE 4 MG PO TABS
4.0000 mg | ORAL_TABLET | Freq: Three times a day (TID) | ORAL | Status: DC
  Administered 2019-06-04 – 2019-06-05 (×5): 4 mg via ORAL
  Filled 2019-06-04 (×6): qty 1

## 2019-06-04 MED ORDER — LIDOCAINE HCL (PF) 1 % IJ SOLN: 5.0000 mL | INTRAMUSCULAR | Status: DC | PRN

## 2019-06-04 MED ORDER — ACETAMINOPHEN 325 MG PO TABS: 650.0000 mg | ORAL_TABLET | Freq: Four times a day (QID) | ORAL | Status: DC | PRN

## 2019-06-04 MED ORDER — POTASSIUM CHLORIDE CRYS ER 20 MEQ PO TBCR
40.0000 meq | EXTENDED_RELEASE_TABLET | Freq: Once | ORAL | Status: AC
  Administered 2019-06-04: 11:00:00 40 meq via ORAL
  Filled 2019-06-04: qty 2

## 2019-06-04 NOTE — ED Notes (Signed)
Pt increasingly more altered, frequent redirection required during hemodialysis catheter placement. Pt required 2 mg versed and 80 mg ketamine in order to place dialysis catheter successfully. After placement of catheter , pt disrobed and was found to be wandering about the ED naked. Pt was quickly covered and returned to ER room before being transported to dialysis.

## 2019-06-04 NOTE — Consult Note (Signed)
Telepsych Consultation   Reason for Consult:  "help with medication, Admitted with lithium toxicity" Referring Physician:  Dr Cyndia Skeeters Location of Patient: [name] Location of Provider: [facility]  Patient Identification: [name] MRN:  [id number] Principal Diagnosis: <principal problem not specified> Diagnosis:  Active Problems:   Lithium toxicity   Total Time spent with patient: 30 minutes  Subjective:   [name] is a 54 y.o. female patient admitted with lithium toxicity.  Patient assessed by nurse practitioner.  Patient alert, patient appears mildly confused.  Patient oriented to self and situation. Patient denies suicidal and homicidal ideations.  Patient denies auditory and visual hallucinations.  Patient gives verbal consent to speak with her sister. Spoke with patient's sister, [name] phone number [phone].  Patient sister has no concerns regarding patient safety patient sister reports patient initially began lithium approximately 6 months ago.  Patient reports outpatient physician at [facility] initiated Depakote "a couple of weeks ago."  HPI: Patient admitted with lithium toxicity.  Past Psychiatric History: Bipolar 1 disorder, schizoaffective disorder  Risk to Self:  Denies Risk to Others:  Denies Prior Inpatient Therapy:  Yes Prior Outpatient Therapy:  Yes  Past Medical History:  Past Medical History:  Diagnosis Date  . Alcohol abuse   . Anemia, iron deficiency   . Arteriosclerotic cardiovascular disease (ASCVD)    Minimal at cath in [facility].stress nuclear study in 8/08 with nl EF; neg stress echo in 2010  . Community acquired pneumonia 01/03/10, 05/2010, 04/2012   2011; with pleural effusion-[facility]osp [facility] acute resp failure; intubated in Jan 2014 (HMPV pneumonia)  . Depression   . Diabetes mellitus, type 2 ([place]) 2000   Onset in 2000; no insulin  . Diarrhea 10/14/2013   Started 10/10/13, improved with Imodium.    . Diastolic dysfunction    grade 2 per echo 2011  . Dysphagia   . Gastroesophageal reflux disease    Schatzki's ring  . History of alcohol abuse 07/22/2007   Qualifier: Diagnosis of  By: Lenn Cal    . Hyperlipidemia   . Hypertension `   during treatment with Geodon  . Hypokalemia 12/25/2013  . Left knee pain 08/25/2014  . Obesity   . Oral candidiasis 05/16/2017  . PTSD (post-traumatic stress disorder)   . Pulmonary hypertension ([place]) 05/02/2012   Patient needs repeat echo in 06/2012   . Schizoaffective disorder    requiring multiple psychiatric admissions  . Viral URI 05/12/2013  . Viral wart on finger 10/08/2016  . Vision changes 08/12/2014    Past Surgical History:  Procedure Laterality Date  . COLONOSCOPY  01/2006   internal hemorrhoids  . COLONOSCOPY  01/10/2012   Dr. Rourk:Single anal canal hemorrhoidal tag likely source of  trivial hematochezia; right-sided colonic diverticulosis  . DILATION AND CURETTAGE, DIAGNOSTIC / THERAPEUTIC  1992  . ESOPHAGEAL DILATION N/A 08/18/2015   Procedure: ESOPHAGEAL DILATION;  Surgeon: Daneil Dolin, MD;  Location[facility];  Service: Endoscopy;  Laterality: N/A;  . ESOPHAGOGASTRODUODENOSCOPY  09/16/08   Dr. Trevor Iha hiatal hernia/excoriations involving the cardia and mucosa consistent with trauma, antral erosions  of linear petechiae ? gastritis versus early gastric antral vascular  ectasia.Marland Kitchen biopsy showed reactive gastropathy. No H. pylori.  . ESOPHAGOGASTRODUODENOSCOPY  09/2007   Dr. Evalee Mutton ring, dilated to 26 French Maloney dilator, small hiatal hernia, antral erosions, biopsies reactive gastropathy.  . ESOPHAGOGASTRODUODENOSCOPY (EGD) WITH PROPOFOL N/A 12/17/2013   GGY:IRSWNIO antral erosions and petechiae. Small hiatal hernia. No  endoscopic explanation for patient's symptoms  . ESOPHAGOGASTRODUODENOSCOPY (EGD) WITH PROPOFOL N/A 08/18/2015   Procedure: ESOPHAGOGASTRODUODENOSCOPY (EGD) WITH PROPOFOL;  Surgeon: Daneil Dolin, MD;   Location: [facility];  Service: Endoscopy;  Laterality: N/A;  0845  . SAVORY DILATION  07/17/2011   Fields-MAC sedation-->distal esophageal stricture s/p dilation, chronic gastritis, multiple ulcers in stomach. no h.pylori   Family History:  Family History  Problem Relation Age of Onset  . Hypertension Mother   . Stroke Father        deceased at age 56  . Colon cancer Other   . Heart disease Sister   . Diabetes Other   . High Cholesterol Other   . Arthritis Other   . Anesthesia problems Neg Hx   . Hypotension Neg Hx   . Malignant hyperthermia Neg Hx   . Pseudochol deficiency Neg Hx    Family Psychiatric  History: Unknown Social History:  Social History   Substance and Sexual Activity  Alcohol Use No  . Alcohol/week: 0.0 standard drinks   Comment: hx of ETOH abuse     Social History   Substance and Sexual Activity  Drug Use No    Social History   Socioeconomic History  . Marital status: Single    Spouse name: Not on file  . Number of children: 2  . Years of education: some colge  . Highest education level: Not on file  Occupational History  . Occupation: Disability  . Occupation: UNEMPLOYED    Employer: UNEMPLOYED  Tobacco Use  . Smoking status: Current Some Day Smoker    Packs/day: 0.75    Years: 30.00    Pack years: 22.50    Types: Cigarettes    Start date: 05/18/2012  . Smokeless tobacco: Never Used  . Tobacco comment: 1 PPD  Substance and Sexual Activity  . Alcohol use: No    Alcohol/week: 0.0 standard drinks    Comment: hx of ETOH abuse  . Drug use: No  . Sexual activity: Not Currently    Partners: Male    Birth control/protection: None  Other Topics Concern  . 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 Difficulty of Paying Living Expenses: Not on file  Food Insecurity:   . Worried About Charity fundraiser in the Last Year: Not on file  . Ran Out of Food in the Last Year: Not on file  Transportation Needs:   . Lack of Transportation (Medical): Not on file  . Lack of Transportation (Non-Medical): Not on file  Physical Activity:   . Days of Exercise per Week: Not on file  . Minutes of Exercise per Session: Not on file  Stress:   . Feeling of Stress : Not on file  Social Connections:   . Frequency of Communication with Friends and Family: Not on file  . Frequency of Social Gatherings with Friends and Family: Not on file  . Attends Religious Services: Not on file  . Active Member of Clubs or Organizations: Not on file  . Attends Archivist Meetings: Not on file  . Marital Status: Not on file   Additional Social History:  Allergies:   Allergies  Allergen Reactions  . Cephalexin     Trouble breathing, felt like she had bumps in her throat.  . Metronidazole Shortness Of Breath and Swelling  . Orange Itching  . Orange Oil   . Other Itching and Shortness Of Breath    Takes Benadryl before eating shrimp.  . Shrimp [Shellfish Allergy] Shortness Of Breath and Itching    Takes Benadryl before eating shrimp.  Marland Kitchen Penicillins Hives and Swelling    Has patient had a PCN reaction causing immediate rash, facial/tongue/throat swelling, SOB or lightheadedness with hypotension: Yes Has patient had a PCN reaction causing severe rash involving mucus membranes or skin necrosis: No Has patient had a PCN reaction that required hospitalization No Has patient had a PCN reaction occurring within the last 10 years: Yes If all of the above answers are "NO", then may proceed with Cephalosporin use.    Fever as well  . Sulfonamide Derivatives Hives    fever  . Glipizide Other (See Comments)     psychosis  . Ace Inhibitors Cough    06/2016  . Sulfamethoxazole Rash  . Sulfamethoxazole-Trimethoprim Rash  . Trimethoprim     Labs:  Results for orders placed or performed during the hospital encounter of 06/03/19 (from the past 48 hour(s))  CBG monitoring, ED     Status: Abnormal   Collection Time: 06/03/19  7:41 PM  Result Value Ref Range   Glucose-Capillary 117 (H) 70 - 99 mg/dL  Lipase, blood     Status: None   Collection Time: 06/03/19  7:57 PM  Result Value Ref Range   Lipase 19 11 - 51 U/L    Comment: Performed at [facility], [place] [address] [place], [place] [address]  Comprehensive metabolic panel     Status: Abnormal   Collection Time: 06/03/19  7:57 PM  Result Value Ref Range   Sodium 133 (L) 135 - 145 mmol/L   Potassium 4.7 3.5 - 5.1 mmol/L   Chloride 103 98 - 111 mmol/L   CO2 19 (L) 22 - 32 mmol/L   Glucose, Bld 120 (H) 70 - 99 mg/dL   BUN 24 (H) 6 - 20 mg/dL   Creatinine, Ser 1.75 (H) 0.44 - 1.00 mg/dL   Calcium 10.3 8.9 - 10.3 mg/dL   Total Protein 6.8 6.5 - 8.1 g/dL   Albumin 4.2 3.5 - 5.0 g/dL   AST 12 (L) 15 - 41 U/L   ALT 10 0 - 44 U/L   Alkaline Phosphatase 81 38 - 126 U/L   Total Bilirubin 0.7 0.3 - 1.2 mg/dL   GFR calc non Af Amer 33 (L) >60 mL/min   GFR calc Af Amer 38 (L) >60 mL/min   Anion gap 11 5 - 15    Comment: Performed at [facility], [place] [address], [place], [place] [address]  CBC     Status: Abnormal   Collection Time: 06/03/19  7:57 PM  Result Value Ref Range   WBC 11.2 (H) 4.0 - 10.5 K/uL   RBC 4.58 3.87 - 5.11 MIL/uL   Hemoglobin 13.0 12.0 - 15.0 g/dL   HCT 41.4 36.0 - 46.0 %   MCV 90.4 80.0 - 100.0 fL   MCH 28.4 26.0 - 34.0 pg   MCHC 31.4 30.0 - 36.0 g/dL   RDW 15.5 11.5 - 15.5 %   Platelets 224 150 - 400 K/uL   nRBC 0.0 0.0 - 0.2 %    Comment:  Performed at [facility], [place] [address], [place], [place] [address]  Magnesium     Status: None   Collection Time: 06/03/19  7:57 PM  Result Value Ref Range    Magnesium 1.7 1.7 - 2.4 mg/dL    Comment: Performed at [facility], [place] [address] [place], [place] [address]  Lithium level     Status: Abnormal   Collection Time: 06/03/19  9:04 PM  Result Value Ref Range   Lithium Lvl 2.21 (HH) 0.60 - 1.20 mmol/L    Comment: CRITICAL RESULT CALLED TO, READ BACK BY AND VERIFIED WITH: RN G KOPP _0  06/03/19 BY S GEZAHEGN Performed at [facility], [place] [address], [place], [place] [address]   Respiratory Panel by RT PCR (Flu A&B, Covid) - Nasopharyngeal Swab     Status: None   Collection Time: 06/03/19 10:33 PM   Specimen: Nasopharyngeal Swab  Result Value Ref Range   SARS Coronavirus 2 by RT PCR NEGATIVE NEGATIVE    Comment: (NOTE) SARS-CoV-2 target nucleic acids are NOT DETECTED. The SARS-CoV-2 RNA is generally detectable in upper respiratoy specimens during the acute phase of infection. The lowest concentration of SARS-CoV-2 viral copies this assay can detect is 131 copies/mL. A negative result does not preclude SARS-Cov-2 infection and should not be used as the sole basis for treatment or other patient management decisions. A negative result may occur with  improper specimen collection/handling, submission of specimen other than nasopharyngeal swab, presence of viral mutation(s) within the areas targeted by this assay, and inadequate number of viral copies (<131 copies/mL). A negative result must be combined with clinical observations, patient history, and epidemiological information. The expected result is Negative. Fact Sheet for Patients:  PinkCheek.be Fact Sheet for Healthcare Providers:  GravelBags.it This test is not yet ap proved or cleared by the [place] FDA and  has been authorized for detection and/or diagnosis of SARS-CoV-2 by FDA under an Emergency Use Authorization (EUA). This EUA will remain  in effect (meaning this test can be used) for the duration of  the COVID-19 declaration under Section 564(b)(1) of the Act, 21 U.S.C. section 360bbb-3(b)(1), unless the authorization is terminated or revoked sooner.    Influenza A by PCR NEGATIVE NEGATIVE   Influenza B by PCR NEGATIVE NEGATIVE    Comment: (NOTE) The Xpert Xpress SARS-CoV-2/FLU/RSV assay is intended as an aid in  the diagnosis of influenza from Nasopharyngeal swab specimens and  should not be used as a sole basis for treatment. Nasal washings and  aspirates are unacceptable for Xpert Xpress SARS-CoV-2/FLU/RSV  testing. Fact Sheet for Patients: PinkCheek.be Fact Sheet for Healthcare Providers: GravelBags.it This test is not yet approved or cleared by the [place] FDA and  has been authorized for detection and/or diagnosis of SARS-CoV-2 by  FDA under an Emergency Use Authorization (EUA). This EUA will remain  in effect (meaning this test can be used) for the duration of the  Covid-19 declaration under Section 564(b)(1) of the Act, 21  U.S.C. section 360bbb-3(b)(1), unless the authorization is  terminated or revoked. Performed at [facility], [place] [address] [place], [place] [address]   APTT     Status: Abnormal   Collection Time: 06/04/19  1:15 AM  Result Value Ref Range   aPTT 21 (L) 24 - 36 seconds    Comment: Performed at [facility] [address], [place], [place] [address]  Protime-INR     Status: None  Collection Time: 06/04/19  1:15 AM  Result Value Ref Range   Prothrombin Time 14.4 11.4 - 15.2 seconds   INR 1.1 0.8 - 1.2    Comment: (NOTE) INR goal varies based on device and disease states. Performed at [facility], [place] [address], [place], [place] [address]   CK     Status: Abnormal   Collection Time: 06/04/19  1:15 AM  Result Value Ref Range   Total CK 25 (L) 38 - 234 U/L    Comment: Performed at [facility], [place] [address], [place], [place] [address]  Hepatitis B  surface antigen     Status: None   Collection Time: 06/04/19  1:44 AM  Result Value Ref Range   Hepatitis B Surface Ag NON REACTIVE NON REACTIVE    Comment: Performed at [facility] [address] [place], [place] [address]  Hepatitis B core antibody, total     Status: None   Collection Time: 06/04/19  1:44 AM  Result Value Ref Range   Hep B Core Total Ab NON REACTIVE NON REACTIVE    Comment: Performed at [facility] [address] [place], [place] [address]  Hepatitis B surface antibody,qualitative     Status: None   Collection Time: 06/04/19  1:44 AM  Result Value Ref Range   Hep B S Ab NON REACTIVE NON REACTIVE    Comment: (NOTE) Inconsistent with immunity, less than 10 mIU/mL. Performed at [facility], [place] [address], [place], [place] [address]   Lithium level     Status: None   Collection Time: 06/04/19  6:44 AM  Result Value Ref Range   Lithium Lvl 0.92 0.60 - 1.20 mmol/L    Comment: Performed at [facility] [address], [place], [place] [address]  HIV Antibody (routine testing w rflx)     Status: None   Collection Time: 06/04/19  6:44 AM  Result Value Ref Range   HIV Screen 4th Generation wRfx NON REACTIVE NON REACTIVE    Comment: Performed at [facility] [address] [place], [place] [address]  Hemoglobin A1c     Status: Abnormal   Collection Time: 06/04/19  6:44 AM  Result Value Ref Range   Hgb A1c MFr Bld 7.4 (H) 4.8 - 5.6 %    Comment: (NOTE) Pre diabetes:          5.7%-6.4% Diabetes:              >6.4% Glycemic control for   <7.0% adults with diabetes    Mean Plasma Glucose 165.68 mg/dL    Comment: Performed at [facility] [address], [place], [place] [address]  CBC     Status: Abnormal   Collection Time: 06/04/19  6:44 AM  Result Value Ref Range   WBC 11.9 (H) 4.0 - 10.5 K/uL   RBC 4.13 3.87 - 5.11 MIL/uL   Hemoglobin 11.7 (L) 12.0 - 15.0 g/dL   HCT 36.7 36.0 - 46.0 %   MCV 88.9 80.0 - 100.0 fL    MCH 28.3 26.0 - 34.0 pg   MCHC 31.9 30.0 - 36.0 g/dL   RDW 15.4 11.5 - 15.5 %   Platelets 155 150 - 400 K/uL   nRBC 0.0 0.0 - 0.2 %    Comment: Performed at [facility], [place] [address], [place], [place] [address]  Comprehensive metabolic panel     Status:  Abnormal   Collection Time: 06/04/19  6:44 AM  Result Value Ref Range   Sodium 136 135 - 145 mmol/L   Potassium 3.4 (L) 3.5 - 5.1 mmol/L   Chloride 102 98 - 111 mmol/L   CO2 22 22 - 32 mmol/L   Glucose, Bld 174 (H) 70 - 99 mg/dL   BUN 8 6 - 20 mg/dL   Creatinine, Ser 1.20 (H) 0.44 - 1.00 mg/dL   Calcium 8.3 (L) 8.9 - 10.3 mg/dL   Total Protein 6.0 (L) 6.5 - 8.1 g/dL   Albumin 3.6 3.5 - 5.0 g/dL   AST 12 (L) 15 - 41 U/L   ALT 11 0 - 44 U/L   Alkaline Phosphatase 86 38 - 126 U/L   Total Bilirubin 1.6 (H) 0.3 - 1.2 mg/dL   GFR calc non Af Amer 52 (L) >60 mL/min   GFR calc Af Amer >60 >60 mL/min   Anion gap 12 5 - 15    Comment: Performed at [facility], [place] [address], [place], [place] [address]  Protime-INR     Status: None   Collection Time: 06/04/19  6:44 AM  Result Value Ref Range   Prothrombin Time 13.5 11.4 - 15.2 seconds   INR 1.0 0.8 - 1.2    Comment: (NOTE) INR goal varies based on device and disease states. Performed at [facility], [place] [address] [place], [place] [address]   Renal function panel     Status: Abnormal   Collection Time: 06/04/19  6:44 AM  Result Value Ref Range   Sodium 137 135 - 145 mmol/L   Potassium 3.4 (L) 3.5 - 5.1 mmol/L   Chloride 103 98 - 111 mmol/L   CO2 22 22 - 32 mmol/L   Glucose, Bld 172 (H) 70 - 99 mg/dL   BUN 8 6 - 20 mg/dL   Creatinine, Ser 1.14 (H) 0.44 - 1.00 mg/dL   Calcium 8.4 (L) 8.9 - 10.3 mg/dL   Phosphorus 2.3 (L) 2.5 - 4.6 mg/dL   Albumin 3.7 3.5 - 5.0 g/dL   GFR calc non Af Amer 56 (L) >60 mL/min   GFR calc Af Amer >60 >60 mL/min   Anion gap 12 5 - 15    Comment: Performed at [facility] [address] [place], [place] [address]   Glucose, capillary     Status: Abnormal   Collection Time: 06/04/19  8:29 AM  Result Value Ref Range   Glucose-Capillary 160 (H) 70 - 99 mg/dL  Glucose, capillary     Status: Abnormal   Collection Time: 06/04/19 12:46 PM  Result Value Ref Range   Glucose-Capillary 144 (H) 70 - 99 mg/dL   *Note: Due to a large number of results and/or encounters for the requested time period, some results have not been displayed. A complete set of results can be found in Results Review.    Medications:  Current Facility-Administered Medications  Medication Dose Route Frequency Provider Last Rate Last Admin  . 0.9 %  sodium chloride infusion  100 mL Intravenous PRN Dwana Melena, MD      . 0.9 %  sodium chloride infusion  100 mL Intravenous PRN Dwana Melena, MD      . 0.9 %  sodium chloride infusion   Intravenous Continuous Donato Heinz, MD 75 mL/hr at 06/04/19 1103 New Bag at 06/04/19 1103  . acetaminophen (TYLENOL) tablet 650 mg  650 mg Oral Q6H PRN Truddie Hidden, MD  Or  . acetaminophen (TYLENOL) suppository 650 mg  650 mg Rectal Q6H PRN Truddie Hidden, MD      . albuterol (PROVENTIL) (2.5 MG/3ML) 0.083% nebulizer solution 2.5 mg  2.5 mg Inhalation Q6H PRN Truddie Hidden, MD      . alteplase (CATHFLO ACTIVASE) injection 2 mg  2 mg Intracatheter Once PRN Dwana Melena, MD      . amLODipine (NORVASC) tablet 10 mg  10 mg Oral Daily Truddie Hidden, MD   10 mg at 06/04/19 1058  . Chlorhexidine Gluconate Cloth 2 % PADS 6 each  6 each Topical Q0600 Dwana Melena, MD   6 each at 06/04/19 [phone]  . enoxaparin (LOVENOX) injection 40 mg  40 mg Subcutaneous Q24H Truddie Hidden, MD   40 mg at 06/04/19 0900  . ferrous sulfate tablet 325 mg  325 mg Oral Rich Fuchs, MD   325 mg at 06/04/19 1058  . heparin injection 1,000 Units  1,000 Units Dialysis PRN Dwana Melena, MD      . insulin aspart (novoLOG) injection 0-5 Units  0-5 Units Subcutaneous QHS Truddie Hidden, MD      . insulin  aspart (novoLOG) injection 0-9 Units  0-9 Units Subcutaneous TID WC Truddie Hidden, MD   1 Units at 06/04/19 1248  . insulin glargine (LANTUS) injection 10 Units  10 Units Subcutaneous Daily Truddie Hidden, MD   10 Units at 06/04/19 1057  . lidocaine (PF) (XYLOCAINE) 1 % injection 5 mL  5 mL Intradermal PRN Dwana Melena, MD      . lidocaine-prilocaine (EMLA) cream 1 application  1 application Topical PRN Dwana Melena, MD      . loratadine (CLARITIN) tablet 10 mg  10 mg Oral Daily Truddie Hidden, MD   10 mg at 06/04/19 1057  . metoprolol tartrate (LOPRESSOR) tablet 50 mg  50 mg Oral 4x daily Truddie Hidden, MD   50 mg at 06/04/19 1057  . nicotine (NICODERM CQ - dosed in mg/24 hours) patch 21 mg  21 mg Transdermal Daily Truddie Hidden, MD   21 mg at 06/04/19 0911  . pantoprazole (PROTONIX) EC tablet 80 mg  80 mg Oral Daily Truddie Hidden, MD   80 mg at 06/04/19 1057  . pentafluoroprop-tetrafluoroeth (GEBAUERS) aerosol 1 application  1 application Topical PRN Dwana Melena, MD      . perphenazine (TRILAFON) tablet 4 mg  4 mg Oral TID Truddie Hidden, MD   4 mg at 06/04/19 1057  . pravastatin (PRAVACHOL) tablet 40 mg  40 mg Oral q1800 Truddie Hidden, MD      . senna-docusate (Senokot-S) tablet 1 tablet  1 tablet Oral QHS PRN Truddie Hidden, MD      . sodium chloride flush (NS) 0.9 % injection 3 mL  3 mL Intravenous Once [name], MD        Musculoskeletal: Strength & Muscle Tone: within normal limits Gait & Station: normal Patient leans: N/A  Psychiatric Specialty Exam: Physical Exam  Nursing note and vitals reviewed. Constitutional: She appears well-developed.  HENT:  Head: Normocephalic.  Cardiovascular: Normal rate.  Respiratory: Effort normal.  Musculoskeletal:        General: Normal range of motion.     Cervical back: Normal range of motion.  Neurological: She is alert.    Review of Systems  Blood pressure 124/75, pulse (!) 104, temperature 98.4 F (36.9 C),  temperature source Oral, resp. rate 18, weight 89.9 kg, SpO2 100 %.Body mass index is  34.02 kg/m.  General Appearance: Casual and Fairly Groomed  Eye Contact:  Good  Speech:  Clear and Coherent and Normal Rate  Volume:  Normal  Mood:  Euthymic  Affect:  Congruent  Thought Process:  Coherent, Goal Directed and Descriptions of Associations: Intact  Orientation:  Other:  Self, situation  Thought Content:  WDL and Logical  Suicidal Thoughts:  No  Homicidal Thoughts:  No  Memory:  Immediate;   Fair Recent;   Fair Remote;   Fair  Judgement:  Fair  Insight:  Fair  Psychomotor Activity:  Normal  Concentration:  Concentration: Good and Attention Span: Good  Recall:  Good  Fund of Knowledge:  Good  Language:  Good  Akathisia:  No  Handed:  Right  AIMS (if indicated):     Assets:  Communication Skills Desire for Improvement Financial Resources/Insurance Housing Intimacy Leisure Time Physical Health Resilience Social Support Talents/Skills  ADL's:  Intact  Cognition:  WNL  Sleep:        Treatment Plan Summary: Medication management recommend consider discontinue lithium.  Continue Trilafon as monotherapy at this time. Case discussed with Dr. Dwyane Dee.  Disposition: No evidence of imminent risk to self or others at present.   Patient does not meet criteria for psychiatric inpatient admission.  This service was provided via telemedicine using a 2-way, interactive audio and video technology.  Names of all persons participating in this telemedicine service and their role in this encounter. Name: [name] Role: Patient  Name: D[place] Lank telephone Role: Sister  Name: Letitia Libra Role: [place], [place] 06/04/2019 2:23 PM

## 2019-06-04 NOTE — Evaluation (Signed)
Clinical/Bedside Swallow Evaluation Patient Details  Name: [name] MRN: [id number] Date of Birth: March 19, 1966  Today's Date: 06/04/2019 Time: SLP Start Time (ACUTE ONLY): 1023 SLP Stop Time (ACUTE ONLY): 1045 SLP Time Calculation (min) (ACUTE ONLY): 22 min  Past Medical History:  Past Medical History:  Diagnosis Date  . Alcohol abuse   . Anemia, iron deficiency   . Arteriosclerotic cardiovascular disease (ASCVD)    Minimal at cath in [facility].stress nuclear study in 8/08 with nl EF; neg stress echo in 2010  . Community acquired pneumonia 01/03/10, 05/2010, 04/2012   2011; with pleural effusion-hosp [facility] acute resp failure; intubated in Jan 2014 (HMPV pneumonia)  . Depression   . Diabetes mellitus, type 2 ([place]) 2000   Onset in 2000; no insulin  . Diarrhea 10/14/2013   Started 10/10/13, improved with Imodium.   . Diastolic dysfunction    grade 2 per echo 2011  . Dysphagia   . Gastroesophageal reflux disease    Schatzki's ring  . History of alcohol abuse 07/22/2007   Qualifier: Diagnosis of  By: Lenn Cal    . Hyperlipidemia   . Hypertension `   during treatment with Geodon  . Hypokalemia 12/25/2013  . Left knee pain 08/25/2014  . Obesity   . Oral candidiasis 05/16/2017  . PTSD (post-traumatic stress disorder)   . Pulmonary hypertension ([place]) 05/02/2012   Patient needs repeat echo in 06/2012   . Schizoaffective disorder    requiring multiple psychiatric admissions  . Viral URI 05/12/2013  . Viral wart on finger 10/08/2016  . Vision changes 08/12/2014   Past Surgical History:  Past Surgical History:  Procedure Laterality Date  . COLONOSCOPY  01/2006   internal hemorrhoids  . COLONOSCOPY  01/10/2012   Dr. Rourk:Single anal canal hemorrhoidal tag likely source of  trivial hematochezia; right-sided colonic diverticulosis  . DILATION AND CURETTAGE, DIAGNOSTIC / THERAPEUTIC  1992  . ESOPHAGEAL DILATION N/A 08/18/2015   Procedure: ESOPHAGEAL DILATION;  Surgeon:  Daneil Dolin, MD;  Location: [facility];  Service: Endoscopy;  Laterality: N/A;  . ESOPHAGOGASTRODUODENOSCOPY  09/16/08   Dr. Trevor Iha hiatal hernia/excoriations involving the cardia and mucosa consistent with trauma, antral erosions  of linear petechiae ? gastritis versus early gastric antral vascular  ectasia.Marland Kitchen biopsy showed reactive gastropathy. No H. pylori.  . ESOPHAGOGASTRODUODENOSCOPY  09/2007   Dr. Evalee Mutton ring, dilated to 49 French Maloney dilator, small hiatal hernia, antral erosions, biopsies reactive gastropathy.  . ESOPHAGOGASTRODUODENOSCOPY (EGD) WITH PROPOFOL N/A 12/17/2013   PJS:RPRXYVO antral erosions and petechiae. Small hiatal hernia. No endoscopic explanation for patient's symptoms  . ESOPHAGOGASTRODUODENOSCOPY (EGD) WITH PROPOFOL N/A 08/18/2015   Procedure: ESOPHAGOGASTRODUODENOSCOPY (EGD) WITH PROPOFOL;  Surgeon: Daneil Dolin, MD;  Location: [facility];  Service: Endoscopy;  Laterality: N/A;  0845  . SAVORY DILATION  07/17/2011   Fields-MAC sedation-->distal esophageal stricture s/p dilation, chronic gastritis, multiple ulcers in stomach. no h.pylori   HPI:  [name] is an 54 y.o. female PMH: depression, PTSD, HTN, HLD, GERD, schizoaffective d/o ETOH abuse, DM, admitted with nausea and ataxia for at least 2 weeks as well as very poor oral intake. Found to have lithium toxicity.  Patient with significant hx of esophageal issues including esophageal stricture, GERD with Schatzki's ring.    Assessment / Plan / Recommendation Clinical Impression   Clinical swallowing evaluation completed, patient alert, oriented to self and place. Pt completed oral care with ST observation.  Per tele, oxygen saturation 99-100% and RR 13-20  throughout this assessment. Oral mechanism exam unremarkable, though white coating on tongue noted, ?oral thrush. Patient with no obvious facial asymmetry, able to follow verbal commands. Patient endorses a history of GERD and esophageal  stricture, she reports she takes medication for GERD, but is unable to specify further.    Patient seen with ice chips, thin liquids via cup and straw sips, puree solids, soft solids and regular solids.  Patient with adequate oral acceptance of all textures, able to lingually grasp and siphon liquids with straw. Oral phase WFL for all consistencies: adequate oral acceptance and mastication with solids, adequate AP transport and swallow initiation appeared to be timely based on bedside presentation. No overt s/sx aspiration observed with any trials seen today. Patient denied pain with swallowing, denied globus sensation.   Recommend regular solids, thin liquids. ST to sign-off as swallow function appears to be [facility] based on her presentation today. Recommend possible GI follow-up as indicated due to long esophageal history.   Please re-consult ST should patient demonstrate s/sx of dysphagia or new needs arise.   SLP Visit Diagnosis: Dysphagia, unspecified (R13.10)    Aspiration Risk  Mild aspiration risk    Diet Recommendation Regular;Thin liquid        Other  Recommendations Recommended Consults: Consider GI evaluation;Consider esophageal assessment Oral Care Recommendations: Oral care BID   Follow up Recommendations None      Frequency and Duration            Prognosis        Swallow Study   General HPI: [name] is an 54 y.o. female PMH: depression, PTSD, HTN, HLD, GERD, schizoaffective d/o ETOH abuse, DM, admitted with nausea and ataxia for at least 2 weeks as well as very poor oral intake. Found to have lithium toxicity.  Patient with significant hx of esophageal issues including esophageal stricture, GERD with Schatzki's ring.  Type of Study: Bedside Swallow Evaluation Diet Prior to this Study: NPO Respiratory Status: Room air History of Recent Intubation: No Behavior/Cognition: Confused;Alert;Cooperative;Pleasant mood Oral Cavity Assessment: Within Functional  Limits Oral Care Completed by SLP: Other (Comment)(patient brushed her own teeth) Oral Cavity - Dentition: Adequate natural dentition Vision: Functional for self-feeding Self-Feeding Abilities: Able to feed self Patient Positioning: Upright in bed Baseline Vocal Quality: Normal Volitional Cough: Strong Volitional Swallow: Able to elicit    Oral/Motor/Sensory Function Overall Oral Motor/Sensory Function: Within functional limits   Ice Chips Ice chips: Within functional limits   Thin Liquid Thin Liquid: Within functional limits Presentation: Cup;Self Fed;Straw    Nectar Thick Nectar Thick Liquid: Not tested   Honey Thick Honey Thick Liquid: Not tested   Puree Puree: Within functional limits   Solid     Solid: Within functional limits Presentation: Self Fed;Spoon      Tura Roller 06/04/2019,10:51 AM   Marina Goodell, M.Ed., CCC-SLP Speech Therapy Acute Rehabilitation [phone]: Acute Rehab office [phone] - pager

## 2019-06-04 NOTE — Progress Notes (Signed)
Patient ID: [name], female   DOB: 10/08/1965, 54 y.o.   MRN: [id number] S: Pt tolerated HD early this morning with improved lithium level.  Awake alert and oriented x 3 but still with slowed mentation. O:BP (!) 142/80 (BP Location: Right Arm)   Pulse (!) 104   Temp 98.8 F (37.1 C) (Oral)   Resp 18   Wt 89.9 kg   SpO2 96%   BMI 34.02 kg/m   Intake/Output Summary (Last 24 hours) at 06/04/2019 1045 Last data filed at 06/04/2019 0440 Gross per 24 hour  Intake --  Output 0 ml  Net 0 ml   Intake/Output: No intake/output data recorded.  Intake/Output this shift:  No intake/output data recorded. Weight change:  Gen: NAD CVS: tachy, no rub Resp: cta Abd: +BS, soft, Nt/ND Ext: no edema  Recent Labs  Lab 06/03/19 1957 06/04/19 0644  NA 133* 136  137  K 4.7 3.4*  3.4*  CL 103 102  103  CO2 19* 22  22  GLUCOSE 120* 174*  172*  BUN 24* 8  8  CREATININE 1.75* 1.20*  1.14*  ALBUMIN 4.2 3.6  3.7  CALCIUM 10.3 8.3*  8.4*  PHOS  --  2.3*  AST 12* 12*  ALT 10 11   Liver Function Tests: Recent Labs  Lab 06/03/19 1957 06/04/19 0644  AST 12* 12*  ALT 10 11  ALKPHOS 81 86  BILITOT 0.7 1.6*  PROT 6.8 6.0*  ALBUMIN 4.2 3.6  3.7   Recent Labs  Lab 06/03/19 1957  LIPASE 19   No results for input(s): AMMONIA in the last 168 hours. CBC: Recent Labs  Lab 06/03/19 1957 06/04/19 0644  WBC 11.2* 11.9*  HGB 13.0 11.7*  HCT 41.4 36.7  MCV 90.4 88.9  PLT 224 155   Cardiac Enzymes: Recent Labs  Lab 06/04/19 0115  CKTOTAL 25*   CBG: Recent Labs  Lab 06/03/19 1650 06/03/19 1941 06/04/19 0829  GLUCAP 118* 117* 160*    Iron Studies: No results for input(s): IRON, TIBC, TRANSFERRIN, FERRITIN in the last 72 hours. Studies/Results: CT HEAD WO CONTRAST  Result Date: 06/04/2019 CLINICAL DATA:  Encephalopathy EXAM: CT HEAD WITHOUT CONTRAST TECHNIQUE: Contiguous axial images were obtained from the base of the skull through the vertex without intravenous  contrast. COMPARISON:  08/25/2017 FINDINGS: Brain: No evidence of acute infarction, hemorrhage, hydrocephalus, extra-axial collection or mass lesion/mass effect. Vascular: No hyperdense vessel or unexpected calcification. Skull: Normal. Negative for fracture or focal lesion. Sinuses/Orbits: No acute finding. IMPRESSION: Negative head CT. Electronically Signed   By: Monte Fantasia M.D.   On: 06/04/2019 06:09   . amLODipine  10 mg Oral Daily  . Chlorhexidine Gluconate Cloth  6 each Topical Q0600  . enoxaparin (LOVENOX) injection  40 mg Subcutaneous Q24H  . ferrous sulfate  325 mg Oral QODAY  . insulin aspart  0-5 Units Subcutaneous QHS  . insulin aspart  0-9 Units Subcutaneous TID WC  . insulin glargine  10 Units Subcutaneous Daily  . ketamine HCl      . lidocaine-EPINEPHrine      . loratadine  10 mg Oral Daily  . metoprolol tartrate  50 mg Oral 4x daily  . midazolam      . nicotine  21 mg Transdermal Daily  . pantoprazole  80 mg Oral Daily  . perphenazine  4 mg Oral TID  . potassium chloride  40 mEq Oral Once  . pravastatin  40 mg Oral q1800  .  sodium chloride flush  3 mL Intravenous Once    BMET    Component Value Date/Time   NA 136 06/04/2019 0644   NA 137 06/04/2019 0644   NA 138 02/19/2019 1424   K 3.4 (L) 06/04/2019 0644   K 3.4 (L) 06/04/2019 0644   CL 102 06/04/2019 0644   CL 103 06/04/2019 0644   CO2 22 06/04/2019 0644   CO2 22 06/04/2019 0644   GLUCOSE 174 (H) 06/04/2019 0644   GLUCOSE 172 (H) 06/04/2019 0644   BUN 8 06/04/2019 0644   BUN 8 06/04/2019 0644   BUN 10 02/19/2019 1424   CREATININE 1.20 (H) 06/04/2019 0644   CREATININE 1.14 (H) 06/04/2019 0644   CREATININE 0.73 03/29/2017 0813   CALCIUM 8.3 (L) 06/04/2019 0644   CALCIUM 8.4 (L) 06/04/2019 0644   GFRNONAA 52 (L) 06/04/2019 0644   GFRNONAA 56 (L) 06/04/2019 0644   GFRNONAA 84 06/23/2014 1637   GFRAA >60 06/04/2019 0644   GFRAA >60 06/04/2019 0644   GFRAA >89 06/23/2014 1637   CBC    Component  Value Date/Time   WBC 11.9 (H) 06/04/2019 0644   RBC 4.13 06/04/2019 0644   HGB 11.7 (L) 06/04/2019 0644   HGB 14.1 06/12/2018 1416   HCT 36.7 06/04/2019 0644   HCT 44.0 06/12/2018 1416   PLT 155 06/04/2019 0644   PLT 253 06/12/2018 1416   MCV 88.9 06/04/2019 0644   MCV 85 06/12/2018 1416   MCH 28.3 06/04/2019 0644   MCHC 31.9 06/04/2019 0644   RDW 15.4 06/04/2019 0644   RDW 15.3 06/12/2018 1416   LYMPHSABS 3.2 09/13/2018 2111   LYMPHSABS 2.6 06/12/2018 1416   MONOABS 0.3 09/13/2018 2111   EOSABS 0.1 09/13/2018 2111   EOSABS 0.1 06/12/2018 1416   BASOSABS 0.0 09/13/2018 2111   BASOSABS 0.0 06/12/2018 1416     Assessment/Plan:  1. AKI- in setting of volume depletion and lithium toxicity.  S/p HD x 1 session early this am.  Cont to follow UOP and daily Scr.  Will start IVF's and follow.  2. Lithium toxicity- s/p HD earlier today with improvement of lithium level to 0.92.  Will recheck later today to make sure she does not have rebound. 3. Schizoaffective disorder 4. Diastolic CHF- appears hypovolemic on exam.  Continue with gentle IVF's for now and follow. 5. DM- per primary 6. Vascular access- left femoral trialysis catheter in place.  Donetta [name], MD [employer] [phone]

## 2019-06-04 NOTE — Progress Notes (Signed)
PROGRESS NOTE  [name] [id number] DOB: 20-Jun-1965   PCP: Ina Homes, MD  Patient is from: Home.  Son stays with her.  DOA: 06/03/2019 LOS: 1  Brief Narrative / Interim history: 54 year old female with history of schizoaffective disorder, PTSD, depression, DM-2, pulmonary HTN, HLD, alcohol and tobacco use presented with altered mental status and 3 weeks of cognitive and functional decline, poor p.o. intake, nausea, new gait ataxia and multiple falls without head trauma.  She was admitted for AKI and toxic encephalopathy due to lithium toxicity.  Lithium level was 2.21.  Poison control and nephrology consulted.  She had HD cath and hemodialysis with improvement in her mental status and GI symptoms.    The next day, lithium level dropped to 0.92.  AKI improved.  Psychiatry consulted and recommended Trilafon monotherapy and discontinuing stopping lithium.   Subjective: Patient had hemodialysis last night.  Lithium level dropped to 0.92.  She is awake and oriented to self and place only.  Denies headache, vision change, chest pain, dyspnea, GI or UTI symptoms.  Very limited insight but denies suicidal ideation or intentional overdose.  Objective: Vitals:   06/04/19 0640 06/04/19 0742 06/04/19 1100 06/04/19 1246  BP:  (!) 142/80 (!) 143/75 124/75  Pulse: (!) 102 (!) 104 98 (!) 104  Resp:  18 (!) 26 18  Temp: 98.8 F (37.1 C)   98.4 F (36.9 C)  TempSrc: Oral   Oral  SpO2: 95% 96% 100% 100%  Weight:        Intake/Output Summary (Last 24 hours) at 06/04/2019 1429 Last data filed at 06/04/2019 1103 Gross per 24 hour  Intake 559.44 ml  Output 0 ml  Net 559.44 ml   Filed Weights   06/03/19 1948  Weight: 89.9 kg    Examination:  GENERAL: No acute distress.  Appears well.  HEENT: MMM.  Vision and hearing grossly intact.  NECK: Supple.  No apparent JVD.  RESP:  No IWOB. Good air movement bilaterally. CVS:  RRR. Heart sounds normal.  ABD/GI/GU: Bowel sounds  present. Soft. Non tender.  MSK/EXT:  Moves extremities. No apparent deformity. No edema.  SKIN: no apparent skin lesion or wound NEURO: Awake, alert and oriented self and place only.  No apparent focal neuro deficit. PSYCH: Calm.  Limited insight.  Procedures:  2/17-left femoral after lysis catheter placed 2/18-hemodialysis  Assessment & Plan: Acute/subacute toxic encephalopathy due to lithium toxicity Generalized weakness/recurrent fall-likely due to lithium toxicity. -Encephalopathy improved.  Awake and oriented x2 -Lithium level 2.21 and dropped to 0.92 after hemodialysis. -Appreciate help by nephrology.  Will follow repeat lithium level this afternoon. -Passed swallow screen.  Will start diet. -Fall precautions. -Follow SLP/PT/OT eval.  Acute kidney injury in the setting of poor p.o. intake, lithium toxicity, ARB and Aldactone. -Nephrology following. -Had hemodialysis for lithium toxicity -Continue monitoring and gentle IV fluid  Schizoaffective disorder/depression/lithium toxicity-denies intentional overdose or suicidal ideation. -Psychiatry consulted-recommended Trilafon monotherapy and discontinuing stopping lithium.   Uncontrolled IDDM-2 Recent Labs    06/03/19 1941 06/04/19 0829 06/04/19 1246  GLUCAP 117* 160* 144*  -Continue current regimen  Essential hypertension: Normotensive -Continue amlodipine and metoprolol -Continue holding losartan and Aldactone in the setting of AKI  Chronic diastolic CHF/pulmonary hypertension: Appears euvolemic. -Agree with gentle IV fluid -Monitor fluid status and renal functions.  Tobacco use disorder -Encourage cessation -Continue nicotine patch  DVT prophylaxis: Subcu Lovenox Code Status: Full code Family Communication: Patient and/or RN. Available if any question.  Discharge barrier: Encephalopathy, AKI  and generalized weakness Patient is from: Home  Final disposition: Likely home when medically stable.  Consultants:  Nephrology, psych   Microbiology summarized: Influenza PCR negative. COVID-19 negative.  Sch Meds:  Scheduled Meds: . amLODipine  10 mg Oral Daily  . Chlorhexidine Gluconate Cloth  6 each Topical Q0600  . enoxaparin (LOVENOX) injection  40 mg Subcutaneous Q24H  . ferrous sulfate  325 mg Oral QODAY  . insulin aspart  0-5 Units Subcutaneous QHS  . insulin aspart  0-9 Units Subcutaneous TID WC  . insulin glargine  10 Units Subcutaneous Daily  . loratadine  10 mg Oral Daily  . metoprolol tartrate  50 mg Oral 4x daily  . nicotine  21 mg Transdermal Daily  . pantoprazole  80 mg Oral Daily  . perphenazine  4 mg Oral TID  . pravastatin  40 mg Oral q1800  . sodium chloride flush  3 mL Intravenous Once   Continuous Infusions: . sodium chloride    . sodium chloride    . sodium chloride 75 mL/hr at 06/04/19 1103   PRN Meds:.sodium chloride, sodium chloride, acetaminophen **OR** acetaminophen, albuterol, alteplase, heparin, lidocaine (PF), lidocaine-prilocaine, pentafluoroprop-tetrafluoroeth, senna-docusate  Antimicrobials: Anti-infectives (From admission, onward)   None       I have personally reviewed the following labs and images: CBC: Recent Labs  Lab 06/03/19 1957 06/04/19 0644  WBC 11.2* 11.9*  HGB 13.0 11.7*  HCT 41.4 36.7  MCV 90.4 88.9  PLT 224 155   BMP &GFR Recent Labs  Lab 06/03/19 1957 06/04/19 0644  NA 133* 136  137  K 4.7 3.4*  3.4*  CL 103 102  103  CO2 19* 22  22  GLUCOSE 120* 174*  172*  BUN 24* 8  8  CREATININE 1.75* 1.20*  1.14*  CALCIUM 10.3 8.3*  8.4*  MG 1.7  --   PHOS  --  2.3*   Estimated Creatinine Clearance: 60.2 mL/min (A) (by C-G formula based on SCr of 1.2 mg/dL (H)). Liver & Pancreas: Recent Labs  Lab 06/03/19 1957 06/04/19 0644  AST 12* 12*  ALT 10 11  ALKPHOS 81 86  BILITOT 0.7 1.6*  PROT 6.8 6.0*  ALBUMIN 4.2 3.6  3.7   Recent Labs  Lab 06/03/19 1957  LIPASE 19   No results for input(s): AMMONIA in the  last 168 hours. Diabetic: Recent Labs    06/04/19 0644  HGBA1C 7.4*   Recent Labs  Lab 06/03/19 1650 06/03/19 1941 06/04/19 0829 06/04/19 1246  GLUCAP 118* 117* 160* 144*   Cardiac Enzymes: Recent Labs  Lab 06/04/19 0115  CKTOTAL 25*   No results for input(s): PROBNP in the last 8760 hours. Coagulation Profile: Recent Labs  Lab 06/04/19 0115 06/04/19 0644  INR 1.1 1.0   Thyroid Function Tests: No results for input(s): TSH, T4TOTAL, FREET4, T3FREE, THYROIDAB in the last 72 hours. Lipid Profile: No results for input(s): CHOL, HDL, LDLCALC, TRIG, CHOLHDL, LDLDIRECT in the last 72 hours. Anemia Panel: No results for input(s): VITAMINB12, FOLATE, FERRITIN, TIBC, IRON, RETICCTPCT in the last 72 hours. Urine analysis:    Component Value Date/Time   COLORURINE YELLOW 11/27/2018 2308   APPEARANCEUR Clear 02/19/2019 1620   LABSPEC 1.027 11/27/2018 2308   PHURINE 5.0 11/27/2018 2308   GLUCOSEU Negative 02/19/2019 1620   GLUCOSEU >1000 01/02/2010 0000   HGBUR NEGATIVE 11/27/2018 2308   HGBUR negative 09/22/2009 1513   BILIRUBINUR Negative 02/19/2019 1620   KETONESUR 5 (A) 11/27/2018 2308   PROTEINUR  Negative 02/19/2019 1620   PROTEINUR 100 (A) 11/27/2018 2308   UROBILINOGEN 0.2 05/10/2018 1405   NITRITE Negative 02/19/2019 1620   NITRITE NEGATIVE 11/27/2018 2308   LEUKOCYTESUR Negative 02/19/2019 1620   LEUKOCYTESUR NEGATIVE 11/27/2018 2308   Sepsis Labs: Invalid input(s): PROCALCITONIN, [place]  Microbiology: Recent Results (from the past 240 hour(s))  Respiratory Panel by RT PCR (Flu A&B, Covid) - Nasopharyngeal Swab     Status: None   Collection Time: 06/03/19 10:33 PM   Specimen: Nasopharyngeal Swab  Result Value Ref Range Status   SARS Coronavirus 2 by RT PCR NEGATIVE NEGATIVE Final    Comment: (NOTE) SARS-CoV-2 target nucleic acids are NOT DETECTED. The SARS-CoV-2 RNA is generally detectable in upper respiratoy specimens during the acute phase of  infection. The lowest concentration of SARS-CoV-2 viral copies this assay can detect is 131 copies/mL. A negative result does not preclude SARS-Cov-2 infection and should not be used as the sole basis for treatment or other patient management decisions. A negative result may occur with  improper specimen collection/handling, submission of specimen other than nasopharyngeal swab, presence of viral mutation(s) within the areas targeted by this assay, and inadequate number of viral copies (<131 copies/mL). A negative result must be combined with clinical observations, patient history, and epidemiological information. The expected result is Negative. Fact Sheet for Patients:  PinkCheek.be Fact Sheet for Healthcare Providers:  GravelBags.it This test is not yet ap proved or cleared by the [place] FDA and  has been authorized for detection and/or diagnosis of SARS-CoV-2 by FDA under an Emergency Use Authorization (EUA). This EUA will remain  in effect (meaning this test can be used) for the duration of the COVID-19 declaration under Section 564(b)(1) of the Act, 21 U.S.C. section 360bbb-3(b)(1), unless the authorization is terminated or revoked sooner.    Influenza A by PCR NEGATIVE NEGATIVE Final   Influenza B by PCR NEGATIVE NEGATIVE Final    Comment: (NOTE) The Xpert Xpress SARS-CoV-2/FLU/RSV assay is intended as an aid in  the diagnosis of influenza from Nasopharyngeal swab specimens and  should not be used as a sole basis for treatment. Nasal washings and  aspirates are unacceptable for Xpert Xpress SARS-CoV-2/FLU/RSV  testing. Fact Sheet for Patients: PinkCheek.be Fact Sheet for Healthcare Providers: GravelBags.it This test is not yet approved or cleared by the [place] FDA and  has been authorized for detection and/or diagnosis of SARS-CoV-2 by  FDA under  an Emergency Use Authorization (EUA). This EUA will remain  in effect (meaning this test can be used) for the duration of the  Covid-19 declaration under Section 564(b)(1) of the Act, 21  U.S.C. section 360bbb-3(b)(1), unless the authorization is  terminated or revoked. Performed at [facility], [place] [address] [place], [place] [address]     Radiology Studies: CT HEAD WO CONTRAST  Result Date: 06/04/2019 CLINICAL DATA:  Encephalopathy EXAM: CT HEAD WITHOUT CONTRAST TECHNIQUE: Contiguous axial images were obtained from the base of the skull through the vertex without intravenous contrast. COMPARISON:  08/25/2017 FINDINGS: Brain: No evidence of acute infarction, hemorrhage, hydrocephalus, extra-axial collection or mass lesion/mass effect. Vascular: No hyperdense vessel or unexpected calcification. Skull: Normal. Negative for fracture or focal lesion. Sinuses/Orbits: No acute finding. IMPRESSION: Negative head CT. Electronically Signed   By: Monte Fantasia M.D.   On: 06/04/2019 06:09   US RENAL  Result Date: 06/04/2019 CLINICAL DATA:  Acute renal failure EXAM: RENAL / URINARY TRACT ULTRASOUND COMPLETE COMPARISON:  CT 04/27/2017 FINDINGS: Right  Kidney: Renal measurements: 11.5 x 5.2 x 5.9 cm = volume: 186 mL. Increased renal cortical echogenicity. No mass or hydronephrosis visualized. Left Kidney: Renal measurements: 9.8 x 5.3 x 5.2 cm = volume: 142 mL. Increased renal cortical echogenicity. No mass or hydronephrosis visualized. Bladder: Appears normal for degree of bladder distention. Other: None. IMPRESSION: 1. Negative for obstructive uropathy. 2. Findings suggesting medical renal disease. Electronically Signed   By: Davina Poke D.O.   On: 06/04/2019 12:23     Milfred Krammes T. [place]  If 7PM-7AM, please contact night-coverage [email or web address] Password [facility] 06/04/2019, 2:29 PM

## 2019-06-04 NOTE — TOC Initial Note (Signed)
Transition of Care [facility]) - Initial/Assessment Note    Patient Details  Name: [name] MRN: [id number] Date of Birth: [birth year]  [facility]) CM/SW Contact:    Bethena Roys, RN Phone Number: 06/04/2019, 3:21 PM  Clinical Narrative:   Patient presented for encephalopathy due to lithium toxicity. Prior to arrival patient states she was independent from home with her parents. Patient uses [facility] in [place]. Case Manager will continue to follow for transition of care needs as the patient progresses.                Expected Discharge Plan: [place] Barriers to Discharge: Continued Medical Work up   Patient Goals and CMS Choice Patient states their goals for this hospitalization and ongoing recovery are:: patient unable to state goals at this time.      Expected Discharge Plan and Services Expected Discharge Plan: [place]   Discharge Planning Services: CM Consult   Living arrangements for the past 2 months: Apartment                  Prior Living Arrangements/Services Living arrangements for the past 2 months: Apartment Lives with:: Parents Patient language and need for interpreter reviewed:: Yes        Need for Family Participation in Patient Care: Yes (Comment) Care giver support system in place?: Yes (comment)   Criminal Activity/Legal Involvement Pertinent to Current Situation/Hospitalization: No - Comment as needed   Emotional Assessment Appearance:: Appears stated age Attitude/Demeanor/Rapport: Unable to Assess Affect (typically observed): Unable to Assess Orientation: : Oriented to Self, Oriented to Place Alcohol / Substance Use: Not Applicable Psych Involvement: No (comment)  Admission diagnosis:  Lithium toxicity [T56.891A] Lithium toxicity, accidental or unintentional, initial encounter [T56.891A] Patient Active Problem List   Diagnosis Date Noted  . Lithium  toxicity 06/03/2019  . Warts 02/19/2019  . Bipolar I disorder, most recent episode (or current) manic ([place]) 09/14/2018  . Schizoaffective disorder, bipolar type ([place]) 08/26/2017  . Immunity status testing 04/17/2017  . Encounter for health education 10/24/2014  . IIH (idiopathic intracranial hypertension) 08/12/2014  . Seasonal allergies 02/23/2014  . Daytime hypersomnolence 12/09/2012  . Insomnia 11/06/2012  . COPD (chronic obstructive pulmonary disease) ([place]) 10/20/2012  . GERD (gastroesophageal reflux disease) 10/15/2012  . Preventative health care 10/15/2012  . Pulmonary hypertension ([place]) 05/02/2012  . Diastolic heart failure ([place]) 02/07/2012  . IDA (iron deficiency anemia) 11/06/2011  . Fatigue 05/17/2011  . Arteriosclerotic cardiovascular disease (ASCVD)   . TOBACCO ABUSE 09/27/2009  . Hyperlipidemia 07/22/2007  . Obesity 07/22/2007  . Bipolar 1 disorder ([place]) 07/22/2007  . Resistant hypertension 07/22/2007  . Diabetes mellitus type 2, controlled ([place]) 09/28/2002   PCP:  Ina Homes, MD Pharmacy:   [facility] [place], [place] AT [place] [id number] [place] [address] Phone: [phone] Fax: [phone]     Social Determinants of Health (SDOH) Interventions    Readmission Risk Interventions Readmission Risk Prevention Plan 06/04/2019  Transportation Screening Complete  PCP or Specialist Appt within 3-5 Days Complete  HRI or [place] Complete  Social Work Consult for [place] Planning/Counseling Complete  Palliative Care Screening Not Applicable  Medication Review Press photographer) Complete  Some recent data might be hidden

## 2019-06-04 NOTE — Plan of Care (Signed)
Care plan initiated.

## 2019-06-04 NOTE — Progress Notes (Signed)
Received call from [employer] and spoke to Braulio Bosch, Therapist, sports. Provided patient updates. Reports she is going to close the case since lithium level had dropped.[facility] providers and Nurses to call [facility] [employer] if any questions or concerns. Elita Boone, BSN, RN

## 2019-06-05 DIAGNOSIS — R531 Weakness: Secondary | ICD-10-CM

## 2019-06-05 DIAGNOSIS — F1020 Alcohol dependence, uncomplicated: Secondary | ICD-10-CM

## 2019-06-05 DIAGNOSIS — G9341 Metabolic encephalopathy: Secondary | ICD-10-CM

## 2019-06-05 LAB — RENAL FUNCTION PANEL
Albumin: 3.5 g/dL (ref 3.5–5.0)
Anion gap: 10 (ref 5–15)
BUN: 6 mg/dL (ref 6–20)
CO2: 22 mmol/L (ref 22–32)
Calcium: 8.3 mg/dL — ABNORMAL LOW (ref 8.9–10.3)
Chloride: 104 mmol/L (ref 98–111)
Creatinine, Ser: 1.13 mg/dL — ABNORMAL HIGH (ref 0.44–1.00)
GFR calc Af Amer: >60 mL/min (ref >60)
GFR calc non Af Amer: 56 mL/min — ABNORMAL LOW (ref >60)
Glucose, Bld: 159 mg/dL — ABNORMAL HIGH (ref 70–99)
Phosphorus: 1.8 mg/dL — ABNORMAL LOW (ref 2.5–4.6)
Potassium: 4 mmol/L (ref 3.5–5.1)
Sodium: 136 mmol/L (ref 135–145)

## 2019-06-05 LAB — LIPID PANEL
Cholesterol: 86 mg/dL (ref 0–200)
HDL: 28 mg/dL — ABNORMAL LOW (ref >40)
LDL Cholesterol: 34 mg/dL (ref 0–99)
Total CHOL/HDL Ratio: 3.1 RATIO
Triglycerides: 118 mg/dL (ref <150)
VLDL: 24 mg/dL (ref 0–40)

## 2019-06-05 LAB — CBC
HCT: 37.9 % (ref 36.0–46.0)
Hemoglobin: 11.8 g/dL — ABNORMAL LOW (ref 12.0–15.0)
MCH: 27.9 pg (ref 26.0–34.0)
MCHC: 31.1 g/dL (ref 30.0–36.0)
MCV: 89.6 fL (ref 80.0–100.0)
Platelets: 155 10*3/uL (ref 150–400)
RBC: 4.23 MIL/uL (ref 3.87–5.11)
RDW: 15.4 % (ref 11.5–15.5)
WBC: 8.5 10*3/uL (ref 4.0–10.5)
nRBC: 0 % (ref 0.0–0.2)

## 2019-06-05 LAB — LITHIUM LEVEL: Lithium Lvl: 0.97 mmol/L (ref 0.60–1.20)

## 2019-06-05 LAB — GLUCOSE, CAPILLARY
Glucose-Capillary: 163 mg/dL — ABNORMAL HIGH (ref 70–99)
Glucose-Capillary: 134 mg/dL — ABNORMAL HIGH (ref 70–99)
Glucose-Capillary: 119 mg/dL — ABNORMAL HIGH (ref 70–99)

## 2019-06-05 LAB — MAGNESIUM: Magnesium: 1.3 mg/dL — ABNORMAL LOW (ref 1.7–2.4)

## 2019-06-05 MED ORDER — LOSARTAN POTASSIUM 50 MG PO TABS: 50.0000 mg | ORAL_TABLET | Freq: Every day | ORAL | 1 refills | Status: DC

## 2019-06-05 MED ORDER — NICOTINE 21 MG/24HR TD PT24: 21.0000 mg | MEDICATED_PATCH | Freq: Every day | TRANSDERMAL | 0 refills | Status: DC

## 2019-06-05 MED ORDER — SENNOSIDES-DOCUSATE SODIUM 8.6-50 MG PO TABS: 1.0000 | ORAL_TABLET | Freq: Every evening | ORAL | 1 refills | Status: DC | PRN

## 2019-06-05 MED ORDER — B-1 100 MG PO TABS: 100.0000 mg | ORAL_TABLET | Freq: Every day | ORAL | 1 refills | Status: DC

## 2019-06-05 MED ORDER — HYDRALAZINE HCL 25 MG PO TABS
25.0000 mg | ORAL_TABLET | Freq: Three times a day (TID) | ORAL | Status: DC
  Administered 2019-06-05 (×2): 25 mg via ORAL
  Filled 2019-06-05 (×2): qty 1

## 2019-06-05 MED ORDER — PERPHENAZINE 4 MG PO TABS: 4.0000 mg | ORAL_TABLET | Freq: Three times a day (TID) | ORAL | 1 refills | Status: DC

## 2019-06-05 MED FILL — VITAMIN B-1 100 MG TABS: 100 | 30 days supply | Qty: 30 | Fill #0

## 2019-06-05 MED FILL — SENEXON-S 8.6-50 MG TABS: 8.6-50 | 20 days supply | Qty: 20 | Fill #0

## 2019-06-05 MED FILL — PERPHENAZINE 4 MG TABS: 4 | 30 days supply | Qty: 90 | Fill #0

## 2019-06-05 MED FILL — LOSARTAN POTASSIUM 50 MG TA: 50 | 90 days supply | Qty: 90 | Fill #0

## 2019-06-05 NOTE — Progress Notes (Signed)
PT Cancellation Note  Patient Details Name: [name] MRN: [id number] DOB: 22-Jun-1965   Cancelled Treatment:    Reason Eval/Treat Not Completed: Other (comment) per chart review, patient has femoral catheter for HD. Unable to find if it is tunneled/permanent or temporary/non-tunneled in chart so far. Reaching out to medical team prior to eval as this makes a big difference in our precautions and what she is allowed to do with therapy.   Windell Norfolk, DPT, PN1   Supplemental Physical Therapist [facility]    Pager [phone] Acute Rehab Office [phone]

## 2019-06-05 NOTE — Progress Notes (Signed)
Provided Son [name], son present at bedtime at the time of discharge all printed materials including d/c medications and instructions. Questions answered and concerns clarified. Verbalized understanding. Rolling walker delivered to room. Discharged home with family, self care. Elita Boone, BSN, RN

## 2019-06-05 NOTE — Plan of Care (Signed)
  Problem: Education: Goal: Knowledge of General Education information will improve Description: Including pain rating scale, medication(s)/side effects and non-pharmacologic comfort measures Outcome: Adequate for Discharge   Problem: Health Behavior/Discharge Planning: Goal: Ability to manage health-related needs will improve Outcome: Adequate for Discharge   Problem: Clinical Measurements: Goal: Ability to maintain clinical measurements within normal limits will improve Outcome: Completed/Met Goal: Will remain free from infection Outcome: Completed/Met Goal: Diagnostic test results will improve Outcome: Completed/Met Goal: Respiratory complications will improve Outcome: Completed/Met Goal: Cardiovascular complication will be avoided Outcome: Completed/Met   Problem: Activity: Goal: Risk for activity intolerance will decrease Outcome: Completed/Met   Problem: Nutrition: Goal: Adequate nutrition will be maintained Outcome: Completed/Met   Problem: Coping: Goal: Level of anxiety will decrease Outcome: Completed/Met   Problem: Elimination: Goal: Will not experience complications related to bowel motility Outcome: Completed/Met Goal: Will not experience complications related to urinary retention Outcome: Completed/Met   Problem: Pain Managment: Goal: General experience of comfort will improve Outcome: Completed/Met   Problem: Safety: Goal: Ability to remain free from injury will improve Outcome: Completed/Met   Problem: Skin Integrity: Goal: Risk for impaired skin integrity will decrease Outcome: Completed/Met   Problem: Pain Managment: Goal: General experience of comfort will improve Outcome: Completed/Met

## 2019-06-05 NOTE — Progress Notes (Signed)
OT Cancellation Note  Patient Details Name: [name] MRN: [id number] DOB: 1965/10/16   Cancelled Treatment:     Medical issues prohibiting therapy regarding non-tunneled catheter, please see PT note for full details. Will re-attempt as schedule permits.  [place] OT office: [place] 06/05/2019, 11:18 AM

## 2019-06-05 NOTE — Evaluation (Signed)
Physical Therapy Evaluation Patient Details Name: [name] MRN: [id number] DOB: 07/23/1965 Today's Date: 06/05/2019   History of Present Illness  54yo female presenting with 3 weeks of cognitive and functional decline; family also reports behavioral changes, reduced PO intake due to nausea, and gait ataxia with multiple falls. CTH clear. Admitted for acute/subacute toxic encepthalopathy, lithium toxicitiy. Femoral non-tunneled cath placed for HD in the ED, removed 06/05/19. PMH DM, history of EtOH abuse, HLD, HTN, obesity, PTSD, schizoaffective disorder  Clinical Impression   Patient received in bed, pleasant and willing to participate in PT- very eager to possibly go home today! Son present and observed session, assisted in providing history. Cognition does remain impaired (A&Ox2) but able to follow simple commands throughout session today. See below for mobility/assist levels. Generally able to mobilize with min guard for safety due to mild unsteadiness and ataxia with gait. Feel that she will be able to return home with 24/7A from family and HHPT.     Follow Up Recommendations Home health PT;Supervision/Assistance - 24 hour    Equipment Recommendations  Rolling walker with 5" wheels    Recommendations for Other Services       Precautions / Restrictions Precautions Precautions: Fall Restrictions Weight Bearing Restrictions: No      Mobility  Bed Mobility Overal bed mobility: Needs Assistance Bed Mobility: Supine to Sit;Sit to Supine     Supine to sit: Supervision;HOB elevated Sit to supine: Supervision   General bed mobility comments: S, increased time and effort, use of rails. Able to independently come to long sitting in bed.  Transfers Overall transfer level: Needs assistance Equipment used: None Transfers: Sit to/from Stand Sit to Stand: Min guard         General transfer comment: min guard for safety and line management  Ambulation/Gait Ambulation/Gait  assistance: Min guard Gait Distance (Feet): 140 Feet Assistive device: None Gait Pattern/deviations: Step-through pattern;Decreased dorsiflexion - right;Decreased dorsiflexion - left;Ataxic;Trendelenburg;Drifts right/left;Narrow base of support Gait velocity: decreased   General Gait Details: continues to have very mild ataxia with gait, min guard for safety and balance without UE support  Stairs            Wheelchair Mobility    Modified Rankin (Stroke Patients Only)       Balance Overall balance assessment: Needs assistance Sitting-balance support: Feet supported Sitting balance-Leahy Scale: Good Sitting balance - Comments: statically   Standing balance support: No upper extremity supported;During functional activity Standing balance-Leahy Scale: Fair Standing balance comment: mild unsteadiness and ataxia, needed min guard for safety                             Pertinent Vitals/Pain Pain Assessment: No/denies pain    Home Living Family/patient expects to be discharged to:: Private residence Living Arrangements: Alone Available Help at Discharge: Family;Available 24 hours/day Type of Home: Apartment Home Access: Level entry     Home Layout: One level Home Equipment: None Additional Comments: family lives nearby and is very supportive, can provide 24/7A if needed    Prior Function Level of Independence: Independent               Hand Dominance        Extremity/Trunk Assessment   Upper Extremity Assessment Upper Extremity Assessment: Generalized weakness    Lower Extremity Assessment Lower Extremity Assessment: Generalized weakness    Cervical / Trunk Assessment Cervical / Trunk Assessment: Normal  Communication   Communication: No  difficulties  Cognition Arousal/Alertness: Awake/alert Behavior During Therapy: Flat affect Overall Cognitive Status: Impaired/Different from baseline Area of Impairment:  Orientation;Attention;Memory;Following commands;Safety/judgement;Awareness;Problem solving                 Orientation Level: Disoriented to;Time;Situation Current Attention Level: Sustained Memory: Decreased short-term memory Following Commands: Follows one step commands inconsistently;Follows one step commands with increased time Safety/Judgement: Decreased awareness of safety Awareness: Intellectual Problem Solving: Slow processing;Requires verbal cues;Difficulty sequencing General Comments: A&Ox2- able to state she is in a hospital with cues but not which one. Able to state partial reason she is here. STM and safety deficits still present.      General Comments General comments (skin integrity, edema, etc.): VSS, son present and observed session    Exercises     Assessment/Plan    PT Assessment Patient needs continued PT services  PT Problem List Decreased strength;Decreased cognition;Decreased safety awareness;Decreased balance;Decreased coordination       PT Treatment Interventions DME instruction;Balance training;Gait training;Neuromuscular re-education;Stair training;Functional mobility training;Patient/family education;Therapeutic activities;Therapeutic exercise    PT Goals (Current goals can be found in the Care Plan section)  Acute Rehab PT Goals Patient Stated Goal: go home today PT Goal Formulation: With patient/family Time For Goal Achievement: 06/19/19 Potential to Achieve Goals: Good    Frequency Min 3X/week   Barriers to discharge        Co-evaluation               AM-PAC PT "6 Clicks" Mobility  Outcome Measure Help needed turning from your back to your side while in a flat bed without using bedrails?: None Help needed moving from lying on your back to sitting on the side of a flat bed without using bedrails?: None Help needed moving to and from a bed to a chair (including a wheelchair)?: A Little Help needed standing up from a chair using  your arms (e.g., wheelchair or bedside chair)?: A Little Help needed to walk in hospital room?: A Little Help needed climbing 3-5 steps with a railing? : A Little 6 Click Score: 20    End of Session Equipment Utilized During Treatment: Gait belt Activity Tolerance: Patient tolerated treatment well Patient left: in bed;with call bell/phone within reach;with bed alarm set;with family/visitor present Nurse Communication: Mobility status PT Visit Diagnosis: Unsteadiness on feet (R26.81);Muscle weakness (generalized) (M62.81);History of falling (Z91.81);Ataxic gait (R26.0)    Time: 1447-1510 PT Time Calculation (min) (ACUTE ONLY): 23 min   Charges:   PT Evaluation $PT Eval Moderate Complexity: 1 Mod PT Treatments $Gait Training: 8-22 mins        Windell Norfolk, DPT, PN1   Supplemental Physical Therapist [place]    Pager [phone] Acute Rehab Office [phone]

## 2019-06-05 NOTE — TOC Transition Note (Signed)
Transition of Care [facility]) - CM/SW Discharge Note   Patient Details  Name: [name] MRN: [id number] Date of Birth: December 03, 1965  [facility]) CM/SW Contact:  Bethena Roys, RN Phone Number: 06/05/2019, 4:10 PM   Clinical Narrative: Case Manager spoke with patient regarding [place].  Medicare.gov list provided and she chose [facility]- unable to accept the patient due to staffing. Case Manager did call Alvis Lemmings and referral was accepted- start of care to begin within 24-48 hours post transition home. Rolling Walker to be delivered by [employer] to the bedside. Son to transport patient home via private vehicle.     Final next level of care: [place] Barriers to Discharge: Continued Medical Work up   Patient Goals and CMS Choice Patient states their goals for this hospitalization and ongoing recovery are:: patient unable to state goals at this time. CMS Medicare.gov Compare Post Acute Care list provided to:: Patient Choice offered to / list presented to : Patient  Discharge Plan and Services In-house Referral: NA Discharge Planning Services: CM Consult Post Acute Care Choice: Home Health, Durable Medical Equipment          DME Arranged: Walker rolling DME Agency: [employer] Date DME Agency Contacted: 06/05/19 Time DME Agency Contacted: [phone] Representative spoke with at DME Agency: [place]: PT, OT [place] Agency: [place] Date [place]: 06/05/19 Time [place]: [place] Representative spoke with at [place]: [place] ([place]) Interventions     Readmission Risk Interventions Readmission Risk Prevention Plan 06/04/2019  Transportation Screening Complete  PCP or Specialist Appt within 3-5 Days Complete  HRI or [place] Complete  Social Work Consult for [place] Planning/Counseling Complete  Palliative Care Screening Not Applicable   Medication Review Press photographer) Complete  Some recent data might be hidden

## 2019-06-05 NOTE — Discharge Summary (Signed)
Physician Discharge Summary  [name] GQQ:[id number] DOB: May 21, 1965 DOA: 06/03/2019  PCP: Ina Homes, MD  Admit date: 06/03/2019 Discharge date: 06/05/2019  Admitted From: Home. Disposition: Home  Recommendations for Outpatient Follow-up:  1. Follow ups as below. 2. Please obtain CBC/BMP/Mag at follow up 3. Please follow up on the following pending results: None  Home Health: PT/OT Equipment/Devices: Rolling walker  Discharge Condition: Stable CODE STATUS: Full code  Hospital Course: 54 year old female with history of schizoaffective disorder, PTSD, depression, DM-2, pulmonary HTN, HLD, alcohol and tobacco use presented with altered mental status and 3 weeks of cognitive and functional decline, poor p.o. intake, nausea, new gait ataxia and multiple falls without head trauma.  She was admitted for AKI and toxic encephalopathy due to lithium toxicity.  Lithium level was 2.21.  Poison control and nephrology consulted.  She had HD cath and hemodialysis with improvement in her mental status and GI symptoms.    The next day, lithium level dropped, 2.21>HD> 0.92> 1.06> 0.97.  AKI improved.  Psychiatry consulted and recommended Trilafon monotherapy and discontinuing lithium.   Encephalopathy improved significantly.  Evaluated by PT/OT who recommended home health PT/OT, 24/7 supervision and rolling walker.  Per patient's daughter, son stays with the patient and able to provide 24/7 supervision.  Patient was also started on p.o. thiamine given history of alcohol use.  See individual problems below for more on hospital course. Discharge Diagnoses:  Acute/subacute toxic encephalopathy due to lithium toxicity: Improved.  Awake and oriented x4 Generalized weakness/recurrent fall-likely due to lithium toxicity. -Lithium level 2.21> HD> 0.92> 1.06> 0.97.  -Lithium discontinued. -HH PT/OT and rolling walker ordered  Acute kidney injury in the setting of poor p.o. intake, lithium  toxicity, ARB and Aldactone.  Baseline Cr 0.8-0.9> 1.75 (admit)>> 1.13.  -Had 1 session of HD for lithium toxicity -HD cath removed.  Lithium discontinued. -Reduced losartan to 50 mg daily and patient to start this in 3 days -Recheck renal function in 1 week  Schizoaffective disorder/depression/lithium toxicity-denies intentional overdose or suicidal ideation. -Psychiatry consulted-recommended Trilafon monotherapy and discontinuing stopping lithium.   Uncontrolled IDDM-2 with hyperglycemia: A1c 7.4%. Recent Labs    06/04/19 2101 06/05/19 0738 06/05/19 1130  GLUCAP 143* 163* 134*  -Discharged on home medications.  Essential hypertension: Normotensive -Discharged on home amlodipine, metoprolol and Aldactone. -Decreased from losartan to 50 mg daily.  Chronic diastolic CHF/pulmonary hypertension: Appears euvolemic.  Not on Lasix. -Cardiac meds as above  Tobacco use disorder -Encouraged cessation -Gave Rx for nicotine patch  History of alcohol use: -Encourage cessation -Discharged on p.o. thiamine.   Discharge Instructions  Discharge Instructions    Diet - low sodium heart healthy   Complete by: As directed    Discharge instructions   Complete by: As directed    It has been a pleasure taking care of you! You were hospitalized with confusion, generalized weakness and kidney injury which were likely due to lithium toxicity.  You were treated with dialysis.  With that, your lithium level dropped to acceptable range.  We have also stopped your lithium and started you on Trilafon.  We have also made other adjustments to your medications. Please review your new medication list and the directions before you take your medications.  We strongly encourage you to quit smoking cigarettes and/or drinking alcohol.  Please follow-up with your primary care doctor and psychiatrist in 1 to 2 weeks.   Take care,   Increase activity slowly   Complete by: As directed  Allergies  as of 06/05/2019      Reactions   Cephalexin    Trouble breathing, felt like she had bumps in her throat.   Metronidazole Shortness Of Breath, Swelling   Orange Itching   Orange Oil    Other Itching, Shortness Of Breath   Takes Benadryl before eating shrimp.   Shrimp [shellfish Allergy] Shortness Of Breath, Itching   Takes Benadryl before eating shrimp.   Penicillins Hives, Swelling   Has patient had a PCN reaction causing immediate rash, facial/tongue/throat swelling, SOB or lightheadedness with hypotension: Yes Has patient had a PCN reaction causing severe rash involving mucus membranes or skin necrosis: No Has patient had a PCN reaction that required hospitalization No Has patient had a PCN reaction occurring within the last 10 years: Yes If all of the above answers are "NO", then may proceed with Cephalosporin use. Fever as well   Sulfonamide Derivatives Hives   fever   Glipizide Other (See Comments)   psychosis   Ace Inhibitors Cough   06/2016   Sulfamethoxazole Rash   Sulfamethoxazole-trimethoprim Rash   Trimethoprim       Medication List    STOP taking these medications   temazepam 30 MG capsule Commonly known as: RESTORIL   traZODone 100 MG tablet Commonly known as: DESYREL   ziprasidone 60 MG capsule Commonly known as: GEODON     TAKE these medications   Accu-Chek FastClix Lancets Misc Use to check you blood sugar 3 times daily   Accu-Chek Guide w/Device Kit 1 Device by Does not apply route 3 (three) times daily. Use to check blood sugars 3 times daily.   albuterol 108 (90 Base) MCG/ACT inhaler Commonly known as: Ventolin HFA Inhale 1-2 puffs into the lungs every 6 (six) hours as needed for wheezing or shortness of breath.   amLODipine 10 MG tablet Commonly known as: NORVASC Take 1 tablet (10 mg total) by mouth daily. For high blood pressure   B-1 100 MG Tabs Take 1 tablet (100 mg total) by mouth daily.   benztropine 1 MG tablet Commonly known as:  COGENTIN Take 1 mg by mouth at bedtime.   esomeprazole 40 MG capsule Commonly known as: NEXIUM TAKE 1 CAPSULE(40 MG) BY MOUTH TWICE DAILY BEFORE A MEAL FOR ACID REFLUX   ferrous sulfate 325 (65 FE) MG tablet Take 1 tablet (325 mg total) by mouth every other day. (May buy from over the counter): For anemia   glucose blood test strip Commonly known as: Accu-Chek Guide Use as instructed   Insulin Glargine 100 UNIT/ML Solostar Pen Commonly known as: LANTUS Inject 19 Units into the skin daily. What changed: how much to take   loratadine 10 MG tablet Commonly known as: CLARITIN Take 1 tablet (10 mg total) by mouth daily. (May buy from over the counter): For allergies   losartan 50 MG tablet Commonly known as: COZAAR Take 1 tablet (50 mg total) by mouth daily. Start taking on: June 07, 2019 What changed:   medication strength  how much to take  additional instructions  These instructions start on June 07, 2019. If you are unsure what to do until then, ask your doctor or other care provider.   lovastatin 40 MG tablet Commonly known as: Mevacor Take 1 tablet (40 mg total) by mouth daily. What changed: when to take this   metFORMIN 1000 MG tablet Commonly known as: GLUCOPHAGE Take 1 tablet (1,000 mg total) by mouth 2 (two) times daily with a meal. For  diabetes management   metoprolol tartrate 100 MG tablet Commonly known as: LOPRESSOR Take 1 tablet (100 mg total) by mouth 2 (two) times daily. For high blood pressure   nicotine 21 mg/24hr patch Commonly known as: NICODERM CQ - dosed in mg/24 hours Place 1 patch (21 mg total) onto the skin daily.   Pen Needles 31G X 5 MM Misc Inject 18 Units into the skin at bedtime. Use to inject insulin daily at bedtime: For diabetes management:   perphenazine 4 MG tablet Commonly known as: TRILAFON Take 1 tablet (4 mg total) by mouth 3 (three) times daily. What changed:   how much to take  how to take this  when to  take this  additional instructions   senna-docusate 8.6-50 MG tablet Commonly known as: Senokot-S Take 1 tablet by mouth at bedtime as needed for mild constipation.   sertraline 50 MG tablet Commonly known as: ZOLOFT Take 50 mg by mouth daily.   spironolactone 50 MG tablet Commonly known as: ALDACTONE Take 1 tablet (50 mg total) by mouth daily. For high blood pressure            Durable Medical Equipment  (From admission, onward)         Start     Ordered   06/05/19 1519  For home use only DME Walker rolling  Once    Question Answer Comment  Walker: With 5 Inch Wheels   Patient needs a walker to treat with the following condition Unsteady gait      06/05/19 1518          Consultations:  Nephrology  Procedures/Studies:  Hemodialysis on 06/03/2019   CT HEAD WO CONTRAST  Result Date: 06/04/2019 CLINICAL DATA:  Encephalopathy EXAM: CT HEAD WITHOUT CONTRAST TECHNIQUE: Contiguous axial images were obtained from the base of the skull through the vertex without intravenous contrast. COMPARISON:  08/25/2017 FINDINGS: Brain: No evidence of acute infarction, hemorrhage, hydrocephalus, extra-axial collection or mass lesion/mass effect. Vascular: No hyperdense vessel or unexpected calcification. Skull: Normal. Negative for fracture or focal lesion. Sinuses/Orbits: No acute finding. IMPRESSION: Negative head CT. Electronically Signed   By: Monte Fantasia M.D.   On: 06/04/2019 06:09   US RENAL  Result Date: 06/04/2019 CLINICAL DATA:  Acute renal failure EXAM: RENAL / URINARY TRACT ULTRASOUND COMPLETE COMPARISON:  CT 04/27/2017 FINDINGS: Right Kidney: Renal measurements: 11.5 x 5.2 x 5.9 cm = volume: 186 mL. Increased renal cortical echogenicity. No mass or hydronephrosis visualized. Left Kidney: Renal measurements: 9.8 x 5.3 x 5.2 cm = volume: 142 mL. Increased renal cortical echogenicity. No mass or hydronephrosis visualized. Bladder: Appears normal for degree of bladder  distention. Other: None. IMPRESSION: 1. Negative for obstructive uropathy. 2. Findings suggesting medical renal disease. Electronically Signed   By: Davina Poke D.O.   On: 06/04/2019 12:23       Discharge Exam: Vitals:   06/05/19 0803 06/05/19 1333  BP:  (!) 151/81  Pulse:  69  Resp: 16 20  Temp:  98.7 F (37.1 C)  SpO2:  98%    GENERAL: No acute distress.  Appears well.  HEENT: MMM.  Vision and hearing grossly intact.  NECK: Supple.  No apparent JVD.  RESP:  No IWOB. Good air movement bilaterally. CVS:  RRR. Heart sounds normal.  ABD/GI/GU: Bowel sounds present. Soft. Non tender.  MSK/EXT:  Moves extremities. No apparent deformity or edema.  SKIN: no apparent skin lesion or wound NEURO: Awake and oriented x4 but somewhat slow.  No apparent focal neuro deficit. PSYCH: Calm. Normal affect.   The results of significant diagnostics from this hospitalization (including imaging, microbiology, ancillary and laboratory) are listed below for reference.     Microbiology: Recent Results (from the past 240 hour(s))  Respiratory Panel by RT PCR (Flu A&B, Covid) - Nasopharyngeal Swab     Status: None   Collection Time: 06/03/19 10:33 PM   Specimen: Nasopharyngeal Swab  Result Value Ref Range Status   SARS Coronavirus 2 by RT PCR NEGATIVE NEGATIVE Final    Comment: (NOTE) SARS-CoV-2 target nucleic acids are NOT DETECTED. The SARS-CoV-2 RNA is generally detectable in upper respiratoy specimens during the acute phase of infection. The lowest concentration of SARS-CoV-2 viral copies this assay can detect is 131 copies/mL. A negative result does not preclude SARS-Cov-2 infection and should not be used as the sole basis for treatment or other patient management decisions. A negative result may occur with  improper specimen collection/handling, submission of specimen other than nasopharyngeal swab, presence of viral mutation(s) within the areas targeted by this assay, and inadequate  number of viral copies (<131 copies/mL). A negative result must be combined with clinical observations, patient history, and epidemiological information. The expected result is Negative. Fact Sheet for Patients:  PinkCheek.be Fact Sheet for Healthcare Providers:  GravelBags.it This test is not yet ap proved or cleared by the [place] FDA and  has been authorized for detection and/or diagnosis of SARS-CoV-2 by FDA under an Emergency Use Authorization (EUA). This EUA will remain  in effect (meaning this test can be used) for the duration of the COVID-19 declaration under Section 564(b)(1) of the Act, 21 U.S.C. section 360bbb-3(b)(1), unless the authorization is terminated or revoked sooner.    Influenza A by PCR NEGATIVE NEGATIVE Final   Influenza B by PCR NEGATIVE NEGATIVE Final    Comment: (NOTE) The Xpert Xpress SARS-CoV-2/FLU/RSV assay is intended as an aid in  the diagnosis of influenza from Nasopharyngeal swab specimens and  should not be used as a sole basis for treatment. Nasal washings and  aspirates are unacceptable for Xpert Xpress SARS-CoV-2/FLU/RSV  testing. Fact Sheet for Patients: PinkCheek.be Fact Sheet for Healthcare Providers: GravelBags.it This test is not yet approved or cleared by the [place] FDA and  has been authorized for detection and/or diagnosis of SARS-CoV-2 by  FDA under an Emergency Use Authorization (EUA). This EUA will remain  in effect (meaning this test can be used) for the duration of the  Covid-19 declaration under Section 564(b)(1) of the Act, 21  U.S.C. section 360bbb-3(b)(1), unless the authorization is  terminated or revoked. Performed at [facility], [place] [address], [place], [place] [address]      Labs: BNP (last 3 results) No results for input(s): BNP in the last 8760 hours. Basic Metabolic Panel: Recent  Labs  Lab 06/03/19 1957 06/04/19 0644 06/05/19 0313  NA 133* 136  137 136  K 4.7 3.4*  3.4* 4.0  CL 103 102  103 104  CO2 19* _0 GLUCOSE 120* 174*  172* 159*  BUN 24* _1 CREATININE 1.75* 1.20*  1.14* 1.13*  CALCIUM 10.3 8.3*  8.4* 8.3*  MG 1.7  --  1.3*  PHOS  --  2.3* 1.8*   Liver Function Tests: Recent Labs  Lab 06/03/19 1957 06/04/19 0644 06/05/19 0313  AST 12* 12*  --   ALT 10 11  --   ALKPHOS 81 86  --  BILITOT 0.7 1.6*  --   PROT 6.8 6.0*  --   ALBUMIN 4.2 3.6  3.7 3.5   Recent Labs  Lab 06/03/19 1957  LIPASE 19   No results for input(s): AMMONIA in the last 168 hours. CBC: Recent Labs  Lab 06/03/19 1957 06/04/19 0644 06/05/19 0313  WBC 11.2* 11.9* 8.5  HGB 13.0 11.7* 11.8*  HCT 41.4 36.7 37.9  MCV 90.4 88.9 89.6  PLT 224 155 155   Cardiac Enzymes: Recent Labs  Lab 06/04/19 0115  CKTOTAL 25*   BNP: Invalid input(s): POCBNP CBG: Recent Labs  Lab 06/04/19 1246 06/04/19 1620 06/04/19 2101 06/05/19 0738 06/05/19 1130  GLUCAP 144* 189* 143* 163* 134*   D-Dimer No results for input(s): DDIMER in the last 72 hours. Hgb A1c Recent Labs    06/04/19 0644  HGBA1C 7.4*   Lipid Profile Recent Labs    06/05/19 0313  CHOL 86  HDL 28*  LDLCALC 34  TRIG 118  CHOLHDL 3.1   Thyroid function studies No results for input(s): TSH, T4TOTAL, T3FREE, THYROIDAB in the last 72 hours.  Invalid input(s): FREET3 Anemia work up No results for input(s): VITAMINB12, FOLATE, FERRITIN, TIBC, IRON, RETICCTPCT in the last 72 hours. Urinalysis    Component Value Date/Time   COLORURINE YELLOW 06/04/2019 1500   APPEARANCEUR HAZY (A) 06/04/2019 1500   APPEARANCEUR Clear 02/19/2019 1620   LABSPEC 1.021 06/04/2019 1500   PHURINE 5.0 06/04/2019 1500   GLUCOSEU NEGATIVE 06/04/2019 1500   GLUCOSEU >1000 01/02/2010 0000   HGBUR NEGATIVE 06/04/2019 1500   HGBUR negative 09/22/2009 1513   BILIRUBINUR NEGATIVE 06/04/2019 1500    BILIRUBINUR Negative 02/19/2019 1620   KETONESUR 20 (A) 06/04/2019 1500   PROTEINUR NEGATIVE 06/04/2019 1500   UROBILINOGEN 0.2 05/10/2018 1405   NITRITE NEGATIVE 06/04/2019 1500   LEUKOCYTESUR NEGATIVE 06/04/2019 1500   Sepsis Labs Invalid input(s): PROCALCITONIN,  WBC,  LACTICIDVEN   Time coordinating discharge: 45 minutes  SIGNED:  Mercy Riding, MD  [facility] 06/05/2019, 3:31 PM  If 7PM-7AM, please contact night-coverage [email or web address] Password [email or web address]

## 2019-06-05 NOTE — Progress Notes (Signed)
Spoke to nephrology- patient has non-tunneled catheter but MD reports due to labs improved they plan to potentially remove it today. Will follow acutely and initiate eval when patient is medically ready.  Windell Norfolk, DPT, PN1   Supplemental Physical Therapist [facility]    Pager [phone] Acute Rehab Office [phone]

## 2019-06-10 ENCOUNTER — Telehealth: Payer: Self-pay | Admitting: Internal Medicine

## 2019-06-10 NOTE — Telephone Encounter (Addendum)
Patient Requesting a call back in reference to having Lithium withdrawals.  Pt reporting she is having a lot of shakes and feel uneasy and would like for someone to call her back as soon as possible.

## 2019-06-10 NOTE — Telephone Encounter (Signed)
RTC to patient, VM obtained and [place] compliant VM left for pt to rtn call  SChaplin, RN,BSN

## 2019-06-11 ENCOUNTER — Encounter: Payer: Self-pay | Admitting: Internal Medicine

## 2019-06-11 ENCOUNTER — Ambulatory Visit (INDEPENDENT_AMBULATORY_CARE_PROVIDER_SITE_OTHER): Payer: Medicare Other | Admitting: Internal Medicine

## 2019-06-11 ENCOUNTER — Other Ambulatory Visit: Payer: Self-pay

## 2019-06-11 VITALS — BP 116/68 | HR 73 | Temp 99.4°F | Ht 64.0 in | Wt 174.4 lb

## 2019-06-11 DIAGNOSIS — T43595D Adverse effect of other antipsychotics and neuroleptics, subsequent encounter: Secondary | ICD-10-CM

## 2019-06-11 DIAGNOSIS — E119 Type 2 diabetes mellitus without complications: Secondary | ICD-10-CM

## 2019-06-11 DIAGNOSIS — Z79899 Other long term (current) drug therapy: Secondary | ICD-10-CM

## 2019-06-11 DIAGNOSIS — T56891A Toxic effect of other metals, accidental (unintentional), initial encounter: Secondary | ICD-10-CM

## 2019-06-11 DIAGNOSIS — Z794 Long term (current) use of insulin: Secondary | ICD-10-CM | POA: Diagnosis not present

## 2019-06-11 DIAGNOSIS — B379 Candidiasis, unspecified: Secondary | ICD-10-CM | POA: Insufficient documentation

## 2019-06-11 DIAGNOSIS — B3749 Other urogenital candidiasis: Secondary | ICD-10-CM | POA: Diagnosis not present

## 2019-06-11 DIAGNOSIS — F17200 Nicotine dependence, unspecified, uncomplicated: Secondary | ICD-10-CM

## 2019-06-11 DIAGNOSIS — Z8742 Personal history of other diseases of the female genital tract: Secondary | ICD-10-CM

## 2019-06-11 DIAGNOSIS — F319 Bipolar disorder, unspecified: Secondary | ICD-10-CM | POA: Diagnosis not present

## 2019-06-11 MED ORDER — NICOTINE 7 MG/24HR TD PT24: 7.0000 mg | MEDICATED_PATCH | Freq: Every day | TRANSDERMAL | 0 refills | Status: DC

## 2019-06-11 MED ORDER — FLUCONAZOLE 150 MG PO TABS: 150.0000 mg | ORAL_TABLET | Freq: Once | ORAL | 0 refills | Status: AC

## 2019-06-11 NOTE — Assessment & Plan Note (Signed)
Patient with known bipolar type I. She was previously on lithium. However she presented the hospital with signs and symptoms consistent with lithium toxicity. She underwent intermittent dialysis. Her lithium was stopped and she was started on perphenazine 4 mg TID and Benztropine 1 mg QHS. She is tolerating the new medication well but does have some nausea. She follows up with [facility]. She is planning to see them within the next 1 to 2 weeks.

## 2019-06-11 NOTE — Progress Notes (Signed)
Internal Medicine Clinic Attending  Case discussed with Dr. Helberg at the time of the visit.  We reviewed the resident's history and exam and pertinent patient test results.  I agree with the assessment, diagnosis, and plan of care documented in the resident's note.    

## 2019-06-11 NOTE — Assessment & Plan Note (Signed)
Patient with known bipolar type I. She was previously on lithium. However she presented the hospital with signs and symptoms consistent with lithium toxicity. She underwent intermittent dialysis. Her lithium was stopped and she was started on perphenazine 4 mg TID and Benztropine 1 mg QHS.  Today we will check a BMP, CBC, magnesium level, and lithium level. She is thrown out all her lithium.

## 2019-06-11 NOTE — Progress Notes (Signed)
   CC: Lithium toxicity, bipolar, DM  HPI:  [name] is a 54 y.o. female with PMHx listed below presenting for Lithium toxicity, bipolar, DM. Please see the A&P for the status of the patient's chronic medical problems.  Past Medical History:  Diagnosis Date  . Alcohol abuse   . Anemia, iron deficiency   . Arteriosclerotic cardiovascular disease (ASCVD)    Minimal at cath in [facility].stress nuclear study in 8/08 with nl EF; neg stress echo in 2010  . Community acquired pneumonia 01/03/10, 05/2010, 04/2012   2011; with pleural effusion-hosp [facility] acute resp failure; intubated in Jan 2014 (HMPV pneumonia)  . Depression   . Diabetes mellitus, type 2 ([place]) 2000   Onset in 2000; no insulin  . Diarrhea 10/14/2013   Started 10/10/13, improved with Imodium.   . Diastolic dysfunction    grade 2 per echo 2011  . Dysphagia   . Gastroesophageal reflux disease    Schatzki's ring  . History of alcohol abuse 07/22/2007   Qualifier: Diagnosis of  By: Lenn Cal    . Hyperlipidemia   . Hypertension `   during treatment with Geodon  . Hypokalemia 12/25/2013  . Left knee pain 08/25/2014  . Obesity   . Oral candidiasis 05/16/2017  . PTSD (post-traumatic stress disorder)   . Pulmonary hypertension ([place]) 05/02/2012   Patient needs repeat echo in 06/2012   . Schizoaffective disorder    requiring multiple psychiatric admissions  . Viral URI 05/12/2013  . Viral wart on finger 10/08/2016  . Vision changes 08/12/2014   Review of Systems:  Performed and all others negative.  Physical Exam: Vitals:   06/11/19 1338  BP: 116/68  Pulse: 73  Temp: 99.4 F (37.4 C)  TempSrc: Oral  SpO2: 100%  Weight: 174 lb 6.4 oz (79.1 kg)  Height: 5\' 4"  (1.626 m)   General: Well nourished female in no acute distress Pulm: Good air movement with no wheezing or crackles  CV: RRR, no murmurs, no rubs   Assessment & Plan:   See Encounters Tab for problem based charting.  Patient discussed with  Dr. Philipp Ovens

## 2019-06-11 NOTE — Assessment & Plan Note (Signed)
Patient with moderately well-controlled type II diabetes. She is currently on metformin 1000 mg twice daily and Lantus 20 units daily. She is tolerating both these medications well without any significant side effects. She continues to check her blood sugar has not noticed any hypoglycemia. She is not physically active.  A/P: - Continue Lantus 20 units QD and metformin 1000 mg BID  - Continue losartan 50 mg QD  - Continue lovastatin 40 mg QD

## 2019-06-11 NOTE — Assessment & Plan Note (Signed)
Patient endorses 2 to 3 days of vaginally itching and discharge. She is not sexually active. She states that this is consistent with prior yeast infection. Will give a one-time dose of fluconazole 150 mg.

## 2019-06-11 NOTE — Patient Instructions (Signed)
Thank you for allowing [name] to provide your care. Today we are going to check some blood work and I will call you if we need to change anything. Continue to work to quit smoking. I would like to see you back in 3 months or sooner if any issues arise.

## 2019-06-11 NOTE — Assessment & Plan Note (Signed)
Patient previously smoked 1.5 packs per day. She is recently trying to cut down on that. She appears to be experiencing signs and symptoms consistent with nicotine withdrawal including flushing, tremor, insomnia. We discussed the use of nicotine replacement. She has patches at home but states that she feels really bad whenever she uses them. Will try to decrease her nicotine patch prescription and see if we can help with symptomatic management. We also discussed the use of nicotine gum.

## 2019-06-12 LAB — BMP8+ANION GAP
Anion Gap: 14 mmol/L (ref 10.0–18.0)
BUN/Creatinine Ratio: 9 (ref 9–23)
BUN: 10 mg/dL (ref 6–24)
CO2: 18 mmol/L — ABNORMAL LOW (ref 20–29)
Calcium: 10.2 mg/dL (ref 8.7–10.2)
Chloride: 103 mmol/L (ref 96–106)
Creatinine, Ser: 1.12 mg/dL — ABNORMAL HIGH (ref 0.57–1.00)
GFR calc Af Amer: 66 mL/min/{1.73_m2} (ref >59)
GFR calc non Af Amer: 57 mL/min/{1.73_m2} — ABNORMAL LOW (ref >59)
Glucose: 83 mg/dL (ref 65–99)
Potassium: 4.7 mmol/L (ref 3.5–5.2)
Sodium: 135 mmol/L (ref 134–144)

## 2019-06-12 LAB — CBC
Hematocrit: 38.7 % (ref 34.0–46.6)
Hemoglobin: 12.5 g/dL (ref 11.1–15.9)
MCH: 28.7 pg (ref 26.6–33.0)
MCHC: 32.3 g/dL (ref 31.5–35.7)
MCV: 89 fL (ref 79–97)
Platelets: 325 10*3/uL (ref 150–450)
RBC: 4.36 x10E6/uL (ref 3.77–5.28)
RDW: 15 % (ref 11.7–15.4)
WBC: 8.7 10*3/uL (ref 3.4–10.8)

## 2019-06-12 LAB — LITHIUM LEVEL: Lithium Lvl: 0.2 mmol/L — ABNORMAL LOW (ref 0.6–1.2)

## 2019-06-12 LAB — MAGNESIUM: Magnesium: 1.4 mg/dL — ABNORMAL LOW (ref 1.6–2.3)

## 2019-06-12 MED ORDER — MAGNESIUM GLUCONATE 30 MG PO TABS: 30.0000 mg | ORAL_TABLET | Freq: Two times a day (BID) | ORAL | 0 refills | Status: DC

## 2019-06-12 MED ORDER — ONDANSETRON 4 MG PO TBDP: 4.0000 mg | ORAL_TABLET | Freq: Three times a day (TID) | ORAL | 0 refills | Status: DC | PRN

## 2019-06-12 NOTE — Addendum Note (Signed)
Addended byIna Homes T on: 06/12/2019 01:25 PM   Modules accepted: Orders

## 2019-06-14 ENCOUNTER — Encounter: Payer: Self-pay | Admitting: Gastroenterology

## 2019-06-14 NOTE — Progress Notes (Signed)
Primary Care Physician: Ina Homes, MD  Primary Gastroenterologist:  Garfield Cornea, MD   Chief Complaint  Patient presents with  . Nausea    with vomiting x few weeks    HPI: [name] is a 54 y.o. female here for follow up. Last seen in 03/2017.  Patient presents with several week history of nausea and vomiting, 25-30 pound weight loss since 05/2019 but reports 40 pounds overall this year.  Recently hospitalized due to altered mental status, cognitive and functional decline, poor oral intake.  Noted to have acute kidney injury and toxic encephalopathy due to lithium toxicity.  Patient states that her weight got up to 207 pounds prior to February.  Several weeks back she started having issues with frequent nausea and vomiting.  Occurring daily.  Having difficulty eating.  Constant nausea.  Similar to several years back.  states she is no longer drinking, quit years ago.  No NSAIDS or ASA powders. Vomiting every day. Vomiting with solids/liquids. Heartburn well controlled on Nexium BID.  Zofran helps for about 4 hours but then resolved.  She was advised to take once every 8 hours.  Bowel movements doing okay.  Denies blood in the stool or melena.  Has been taking some Pepto-Bismol for stomach upset.  Advised to take only occasional, not on a regular basis.  She denies any abdominal pain.  EGD 08/2015: normal esophagus s/p dilation, normal stomach/duodenum.   Colonoscopy 12/2011: hemorrhoid tag, scattered ascending colon diverticula, otherwise normal.    06/03/2019: 198 pounds 06/11/2019: 174 pounds 06/15/2019: 171 pounds  Current Outpatient Medications  Medication Sig Dispense Refill  . Accu-Chek FastClix Lancets MISC Use to check you blood sugar 3 times daily 306 each 12  . albuterol (VENTOLIN HFA) 108 (90 Base) MCG/ACT inhaler Inhale 1-2 puffs into the lungs every 6 (six) hours as needed for wheezing or shortness of breath. 18 g 5  . amLODipine (NORVASC) 10 MG tablet  Take 1 tablet (10 mg total) by mouth daily. For high blood pressure 90 tablet 3  . benztropine (COGENTIN) 1 MG tablet Take 1 mg by mouth at bedtime.    . Blood Glucose Monitoring Suppl (ACCU-CHEK GUIDE) w/Device KIT 1 Device by Does not apply route 3 (three) times daily. Use to check blood sugars 3 times daily. 1 kit 0  . esomeprazole (NEXIUM) 40 MG capsule TAKE 1 CAPSULE(40 MG) BY MOUTH TWICE DAILY BEFORE A MEAL FOR ACID REFLUX 180 capsule 2  . ferrous sulfate 325 (65 FE) MG tablet Take 1 tablet (325 mg total) by mouth every other day. (May buy from over the counter): For anemia 15 tablet 2  . glucose blood (ACCU-CHEK GUIDE) test strip Use as instructed 100 each 12  . Insulin Glargine (LANTUS) 100 UNIT/ML Solostar Pen Inject 19 Units into the skin daily. (Patient taking differently: Inject 15 Units into the skin daily. ) 15 mL 1  . Insulin Pen Needle (PEN NEEDLES) 31G X 5 MM MISC Inject 18 Units into the skin at bedtime. Use to inject insulin daily at bedtime: For diabetes management: 100 each 2  . loratadine (CLARITIN) 10 MG tablet Take 1 tablet (10 mg total) by mouth daily. (May buy from over the counter): For allergies 30 tablet 2  . losartan (COZAAR) 50 MG tablet Take 1 tablet (50 mg total) by mouth daily. 90 tablet 1  . magnesium gluconate (MAGONATE) 30 MG tablet Take 1 tablet (30 mg total) by mouth 2 (two) times daily.  14 tablet 0  . metFORMIN (GLUCOPHAGE) 1000 MG tablet Take 1 tablet (1,000 mg total) by mouth 2 (two) times daily with a meal. For diabetes management 180 tablet 3  . metoprolol tartrate (LOPRESSOR) 100 MG tablet Take 1 tablet (100 mg total) by mouth 2 (two) times daily. For high blood pressure 180 tablet 3  . nicotine (NICODERM CQ - DOSED IN MG/24 HR) 7 mg/24hr patch Place 1 patch (7 mg total) onto the skin daily. 21 patch 0  . ondansetron (ZOFRAN ODT) 4 MG disintegrating tablet Take 1 tablet (4 mg total) by mouth every 8 (eight) hours as needed for nausea or vomiting. 20 tablet 0   . perphenazine (TRILAFON) 4 MG tablet Take 1 tablet (4 mg total) by mouth 3 (three) times daily. 270 tablet 1  . senna-docusate (SENOKOT-S) 8.6-50 MG tablet Take 1 tablet by mouth at bedtime as needed for mild constipation. 60 tablet 1  . sertraline (ZOLOFT) 50 MG tablet Take 50 mg by mouth daily.    Marland Kitchen spironolactone (ALDACTONE) 50 MG tablet Take 1 tablet (50 mg total) by mouth daily. For high blood pressure 90 tablet 3  . Thiamine HCl (VITAMIN B-1) 100 MG TABS Take 1 tablet (100 mg total) by mouth daily. 90 tablet 1  . lovastatin (MEVACOR) 40 MG tablet Take 1 tablet (40 mg total) by mouth daily. (Patient taking differently: Take 40 mg by mouth every evening. ) 90 tablet 3   No current facility-administered medications for this visit.    Allergies as of 06/15/2019 - Review Complete 06/15/2019  Allergen Reaction Noted  . Cephalexin  10/14/2013  . Metronidazole Shortness Of Breath and Swelling 04/23/2017  . Orange Itching 03/27/2011  . Orange oil  03/27/2011  . Other Itching and Shortness Of Breath 03/27/2011  . Shrimp [shellfish allergy] Shortness Of Breath and Itching 03/27/2011  . Penicillins Hives and Swelling 04/23/2017  . Sulfonamide derivatives Hives   . Glipizide Other (See Comments) 01/15/2011  . Ace inhibitors Cough 06/14/2016  . Sulfamethoxazole Rash 02/27/2010  . Sulfamethoxazole-trimethoprim Rash 02/27/2010  . Trimethoprim  02/27/2010   Past Medical History:  Diagnosis Date  . Alcohol abuse   . Anemia, iron deficiency   . Arteriosclerotic cardiovascular disease (ASCVD)    Minimal at cath in [facility].stress nuclear study in 8/08 with nl EF; neg stress echo in 2010  . Community acquired pneumonia 01/03/10, 05/2010, 04/2012   2011; with pleural effusion-hosp [facility] acute resp failure; intubated in Jan 2014 (HMPV pneumonia)  . Depression   . Diabetes mellitus, type 2 ([place]) 2000   Onset in 2000; no insulin  . Diarrhea 10/14/2013   Started 10/10/13, improved with  Imodium.   . Diastolic dysfunction    grade 2 per echo 2011  . Dysphagia   . Gastroesophageal reflux disease    Schatzki's ring  . History of alcohol abuse 07/22/2007   Qualifier: Diagnosis of  By: Lenn Cal    . Hyperlipidemia   . Hypertension `   during treatment with Geodon  . Hypokalemia 12/25/2013  . Left knee pain 08/25/2014  . Obesity   . Oral candidiasis 05/16/2017  . PTSD (post-traumatic stress disorder)   . Pulmonary hypertension ([place]) 05/02/2012   Patient needs repeat echo in 06/2012   . Schizoaffective disorder    requiring multiple psychiatric admissions  . Viral URI 05/12/2013  . Viral wart on finger 10/08/2016  . Vision changes 08/12/2014   Past Surgical History:  Procedure Laterality Date  . COLONOSCOPY  01/2006   internal hemorrhoids  . COLONOSCOPY  01/10/2012   Dr. Rourk:Single anal canal hemorrhoidal tag likely source of  trivial hematochezia; right-sided colonic diverticulosis  . DILATION AND CURETTAGE, DIAGNOSTIC / THERAPEUTIC  1992  . ESOPHAGEAL DILATION N/A 08/18/2015   Procedure: ESOPHAGEAL DILATION;  Surgeon: Daneil Dolin, MD;  Location: [facility];  Service: Endoscopy;  Laterality: N/A;  . ESOPHAGOGASTRODUODENOSCOPY  09/16/08   Dr. Trevor Iha hiatal hernia/excoriations involving the cardia and mucosa consistent with trauma, antral erosions  of linear petechiae ? gastritis versus early gastric antral vascular  ectasia.Marland Kitchen biopsy showed reactive gastropathy. No H. pylori.  . ESOPHAGOGASTRODUODENOSCOPY  09/2007   Dr. Evalee Mutton ring, dilated to 49 French Maloney dilator, small hiatal hernia, antral erosions, biopsies reactive gastropathy.  . ESOPHAGOGASTRODUODENOSCOPY (EGD) WITH PROPOFOL N/A 12/17/2013   RFF:[id number] antral erosions and petechiae. Small hiatal hernia. No endoscopic explanation for patient's symptoms  . ESOPHAGOGASTRODUODENOSCOPY (EGD) WITH PROPOFOL N/A 08/18/2015   Dr. Gala Romney: normal exam, s/p esophageal dilation  . SAVORY DILATION  07/17/2011     [facility] sedation-->distal esophageal stricture s/p dilation, chronic gastritis, multiple ulcers in stomach. no h.pylori   Family History  Problem Relation Age of Onset  . Hypertension Mother   . Stroke Father        deceased at age 37  . Colon cancer Other   . Heart disease Sister   . Diabetes Other   . High Cholesterol Other   . Arthritis Other   . Anesthesia problems Neg Hx   . Hypotension Neg Hx   . Malignant hyperthermia Neg Hx   . Pseudochol deficiency Neg Hx    Social History   Tobacco Use  . Smoking status: Current Some Day Smoker    Packs/day: 1.00    Years: 30.00    Pack years: 30.00    Types: Cigarettes    Start date: 05/18/2012  . Smokeless tobacco: Never Used  . Tobacco comment: 1 PPD  Substance Use Topics  . Alcohol use: No    Alcohol/week: 0.0 standard drinks    Comment: hx of ETOH abuse  . Drug use: No    ROS:  General: Negative for a  fever, chills, fatigue, weakness.  See HPI ENT: Negative for hoarseness, difficulty swallowing , nasal congestion. CV: Negative for chest pain, angina, palpitations, dyspnea on exertion, peripheral edema.  Respiratory: Negative for dyspnea at rest, dyspnea on exertion, cough, sputum, wheezing.  GI: See history of present illness. GU:  Negative for dysuria, hematuria, urinary incontinence, urinary frequency, nocturnal urination.  Endo: See HPI   Physical Examination:   BP 115/85   Pulse 84   Temp (!) 97 F (36.1 C) (Oral)   Ht _0  (1.626 m)   Wt 171 lb 6.4 oz (77.7 kg)   BMI 29.42 kg/m   General: Well-nourished, well-developed in no acute distress.  Accompanied by son. Eyes: No icterus. Mouth: Oropharyngeal mucosa moist and pink , no lesions erythema or exudate. Lungs: Clear to auscultation bilaterally.  Heart: Regular rate and rhythm, no murmurs rubs or gallops.  Abdomen: Bowel sounds are normal, nontender, nondistended, no hepatosplenomegaly or masses, no abdominal bruits or hernia , no rebound or  guarding.   Extremities: No lower extremity edema. No clubbing or deformities. Neuro: Alert and oriented x 4   Skin: Warm and dry, no jaundice.   Psych: Alert and cooperative, normal mood and affect.  Labs:  Lab Results  Component Value Date   CREATININE 1.12 (H) 06/11/2019   BUN  10 06/11/2019   NA 135 06/11/2019   K 4.7 06/11/2019   CL 103 06/11/2019   CO2 18 (L) 06/11/2019   Lab Results  Component Value Date   WBC 8.7 06/11/2019   HGB 12.5 06/11/2019   HCT 38.7 06/11/2019   MCV 89 06/11/2019   PLT 325 06/11/2019   Lab Results  Component Value Date   ALT 11 06/04/2019   AST 12 (L) 06/04/2019   ALKPHOS 86 06/04/2019   BILITOT 1.6 (H) 06/04/2019     Lab Results  Component Value Date   HGBA1C 7.4 (H) 06/04/2019    Imaging Studies: CT HEAD WO CONTRAST  Result Date: 06/04/2019 CLINICAL DATA:  Encephalopathy EXAM: CT HEAD WITHOUT CONTRAST TECHNIQUE: Contiguous axial images were obtained from the base of the skull through the vertex without intravenous contrast. COMPARISON:  08/25/2017 FINDINGS: Brain: No evidence of acute infarction, hemorrhage, hydrocephalus, extra-axial collection or mass lesion/mass effect. Vascular: No hyperdense vessel or unexpected calcification. Skull: Normal. Negative for fracture or focal lesion. Sinuses/Orbits: No acute finding. IMPRESSION: Negative head CT. Electronically Signed   By: Monte Fantasia M.D.   On: 06/04/2019 06:09   US RENAL  Result Date: 06/04/2019 CLINICAL DATA:  Acute renal failure EXAM: RENAL / URINARY TRACT ULTRASOUND COMPLETE COMPARISON:  CT 04/27/2017 FINDINGS: Right Kidney: Renal measurements: 11.5 x 5.2 x 5.9 cm = volume: 186 mL. Increased renal cortical echogenicity. No mass or hydronephrosis visualized. Left Kidney: Renal measurements: 9.8 x 5.3 x 5.2 cm = volume: 142 mL. Increased renal cortical echogenicity. No mass or hydronephrosis visualized. Bladder: Appears normal for degree of bladder distention. Other: None.  IMPRESSION: 1. Negative for obstructive uropathy. 2. Findings suggesting medical renal disease. Electronically Signed   By: Davina Poke D.O.   On: 06/04/2019 12:23

## 2019-06-14 NOTE — H&P (View-Only) (Signed)
    Primary Care Physician: Helberg, Justin, MD  Primary Gastroenterologist:  Michael Rourk, MD   Chief Complaint  Patient presents with  . Nausea    with vomiting x few weeks    HPI: [name] is a 53 y.o. female here for follow up. Last seen in 03/2017.  Patient presents with several week history of nausea and vomiting, 25-30 pound weight loss since 05/2019 but reports 40 pounds overall this year.  Recently hospitalized due to altered mental status, cognitive and functional decline, poor oral intake.  Noted to have acute kidney injury and toxic encephalopathy due to lithium toxicity.  Patient states that her weight got up to 207 pounds prior to February.  Several weeks back she started having issues with frequent nausea and vomiting.  Occurring daily.  Having difficulty eating.  Constant nausea.  Similar to several years back.  states she is no longer drinking, quit years ago.  No NSAIDS or ASA powders. Vomiting every day. Vomiting with solids/liquids. Heartburn well controlled on Nexium BID.  Zofran helps for about 4 hours but then resolved.  She was advised to take once every 8 hours.  Bowel movements doing okay.  Denies blood in the stool or melena.  Has been taking some Pepto-Bismol for stomach upset.  Advised to take only occasional, not on a regular basis.  She denies any abdominal pain.  EGD 08/2015: normal esophagus s/p dilation, normal stomach/duodenum.   Colonoscopy 12/2011: hemorrhoid tag, scattered ascending colon diverticula, otherwise normal.    06/03/2019: 198 pounds 06/11/2019: 174 pounds 06/15/2019: 171 pounds  Current Outpatient Medications  Medication Sig Dispense Refill  . Accu-Chek FastClix Lancets MISC Use to check you blood sugar 3 times daily 306 each 12  . albuterol (VENTOLIN HFA) 108 (90 Base) MCG/ACT inhaler Inhale 1-2 puffs into the lungs every 6 (six) hours as needed for wheezing or shortness of breath. 18 g 5  . amLODipine (NORVASC) 10 MG tablet  Take 1 tablet (10 mg total) by mouth daily. For high blood pressure 90 tablet 3  . benztropine (COGENTIN) 1 MG tablet Take 1 mg by mouth at bedtime.    . Blood Glucose Monitoring Suppl (ACCU-CHEK GUIDE) w/Device KIT 1 Device by Does not apply route 3 (three) times daily. Use to check blood sugars 3 times daily. 1 kit 0  . esomeprazole (NEXIUM) 40 MG capsule TAKE 1 CAPSULE(40 MG) BY MOUTH TWICE DAILY BEFORE A MEAL FOR ACID REFLUX 180 capsule 2  . ferrous sulfate 325 (65 FE) MG tablet Take 1 tablet (325 mg total) by mouth every other day. (May buy from over the counter): For anemia 15 tablet 2  . glucose blood (ACCU-CHEK GUIDE) test strip Use as instructed 100 each 12  . Insulin Glargine (LANTUS) 100 UNIT/ML Solostar Pen Inject 19 Units into the skin daily. (Patient taking differently: Inject 15 Units into the skin daily. ) 15 mL 1  . Insulin Pen Needle (PEN NEEDLES) 31G X 5 MM MISC Inject 18 Units into the skin at bedtime. Use to inject insulin daily at bedtime: For diabetes management: 100 each 2  . loratadine (CLARITIN) 10 MG tablet Take 1 tablet (10 mg total) by mouth daily. (May buy from over the counter): For allergies 30 tablet 2  . losartan (COZAAR) 50 MG tablet Take 1 tablet (50 mg total) by mouth daily. 90 tablet 1  . magnesium gluconate (MAGONATE) 30 MG tablet Take 1 tablet (30 mg total) by mouth 2 (two) times daily.   14 tablet 0  . metFORMIN (GLUCOPHAGE) 1000 MG tablet Take 1 tablet (1,000 mg total) by mouth 2 (two) times daily with a meal. For diabetes management 180 tablet 3  . metoprolol tartrate (LOPRESSOR) 100 MG tablet Take 1 tablet (100 mg total) by mouth 2 (two) times daily. For high blood pressure 180 tablet 3  . nicotine (NICODERM CQ - DOSED IN MG/24 HR) 7 mg/24hr patch Place 1 patch (7 mg total) onto the skin daily. 21 patch 0  . ondansetron (ZOFRAN ODT) 4 MG disintegrating tablet Take 1 tablet (4 mg total) by mouth every 8 (eight) hours as needed for nausea or vomiting. 20 tablet 0   . perphenazine (TRILAFON) 4 MG tablet Take 1 tablet (4 mg total) by mouth 3 (three) times daily. 270 tablet 1  . senna-docusate (SENOKOT-S) 8.6-50 MG tablet Take 1 tablet by mouth at bedtime as needed for mild constipation. 60 tablet 1  . sertraline (ZOLOFT) 50 MG tablet Take 50 mg by mouth daily.    . spironolactone (ALDACTONE) 50 MG tablet Take 1 tablet (50 mg total) by mouth daily. For high blood pressure 90 tablet 3  . Thiamine HCl (VITAMIN B-1) 100 MG TABS Take 1 tablet (100 mg total) by mouth daily. 90 tablet 1  . lovastatin (MEVACOR) 40 MG tablet Take 1 tablet (40 mg total) by mouth daily. (Patient taking differently: Take 40 mg by mouth every evening. ) 90 tablet 3   No current facility-administered medications for this visit.    Allergies as of 06/15/2019 - Review Complete 06/15/2019  Allergen Reaction Noted  . Cephalexin  10/14/2013  . Metronidazole Shortness Of Breath and Swelling 04/23/2017  . Orange Itching 03/27/2011  . Orange oil  03/27/2011  . Other Itching and Shortness Of Breath 03/27/2011  . Shrimp [shellfish allergy] Shortness Of Breath and Itching 03/27/2011  . Penicillins Hives and Swelling 04/23/2017  . Sulfonamide derivatives Hives   . Glipizide Other (See Comments) 01/15/2011  . Ace inhibitors Cough 06/14/2016  . Sulfamethoxazole Rash 02/27/2010  . Sulfamethoxazole-trimethoprim Rash 02/27/2010  . Trimethoprim  02/27/2010   Past Medical History:  Diagnosis Date  . Alcohol abuse   . Anemia, iron deficiency   . Arteriosclerotic cardiovascular disease (ASCVD)    Minimal at cath in 07;negative pharm.stress nuclear study in 8/08 with nl EF; neg stress echo in 2010  . Community acquired pneumonia 01/03/10, 05/2010, 04/2012   2011; with pleural effusion-hosp [facility] acute resp failure; intubated in Jan 2014 (HMPV pneumonia)  . Depression   . Diabetes mellitus, type 2 ([facility]) 2000   Onset in 2000; no insulin  . Diarrhea 10/14/2013   Started 10/10/13, improved with  Imodium.   . Diastolic dysfunction    grade 2 per echo 2011  . Dysphagia   . Gastroesophageal reflux disease    Schatzki's ring  . History of alcohol abuse 07/22/2007   Qualifier: Diagnosis of  By: Doss, Sallie    . Hyperlipidemia   . Hypertension `   during treatment with Geodon  . Hypokalemia 12/25/2013  . Left knee pain 08/25/2014  . Obesity   . Oral candidiasis 05/16/2017  . PTSD (post-traumatic stress disorder)   . Pulmonary hypertension ([facility]) 05/02/2012   Patient needs repeat echo in 06/2012   . Schizoaffective disorder    requiring multiple psychiatric admissions  . Viral URI 05/12/2013  . Viral wart on finger 10/08/2016  . Vision changes 08/12/2014   Past Surgical History:  Procedure Laterality Date  . COLONOSCOPY    01/2006   internal hemorrhoids  . COLONOSCOPY  01/10/2012   Dr. Rourk:Single anal canal hemorrhoidal tag likely source of  trivial hematochezia; right-sided colonic diverticulosis  . DILATION AND CURETTAGE, DIAGNOSTIC / THERAPEUTIC  1992  . ESOPHAGEAL DILATION N/A 08/18/2015   Procedure: ESOPHAGEAL DILATION;  Surgeon: Robert M Rourk, MD;  Location: [facility];  Service: Endoscopy;  Laterality: N/A;  . ESOPHAGOGASTRODUODENOSCOPY  09/16/08   Dr. Rourk-->small hiatal hernia/excoriations involving the cardia and mucosa consistent with trauma, antral erosions  of linear petechiae ? gastritis versus early gastric antral vascular  ectasia.. biopsy showed reactive gastropathy. No H. pylori.  . ESOPHAGOGASTRODUODENOSCOPY  09/2007   Dr. Rourk-->Schatzki ring, dilated to 56 French Maloney dilator, small hiatal hernia, antral erosions, biopsies reactive gastropathy.  . ESOPHAGOGASTRODUODENOSCOPY (EGD) WITH PROPOFOL N/A 12/17/2013   RMR:Trivial antral erosions and petechiae. Small hiatal hernia. No endoscopic explanation for patient's symptoms  . ESOPHAGOGASTRODUODENOSCOPY (EGD) WITH PROPOFOL N/A 08/18/2015   Dr. Rourk: normal exam, s/p esophageal dilation  . SAVORY DILATION  07/17/2011     [facility] sedation-->distal esophageal stricture s/p dilation, chronic gastritis, multiple ulcers in stomach. no h.pylori   Family History  Problem Relation Age of Onset  . Hypertension Mother   . Stroke Father        deceased at age 50  . Colon cancer Other   . Heart disease Sister   . Diabetes Other   . High Cholesterol Other   . Arthritis Other   . Anesthesia problems Neg Hx   . Hypotension Neg Hx   . Malignant hyperthermia Neg Hx   . Pseudochol deficiency Neg Hx    Social History   Tobacco Use  . Smoking status: Current Some Day Smoker    Packs/day: 1.00    Years: 30.00    Pack years: 30.00    Types: Cigarettes    Start date: 05/18/2012  . Smokeless tobacco: Never Used  . Tobacco comment: 1 PPD  Substance Use Topics  . Alcohol use: No    Alcohol/week: 0.0 standard drinks    Comment: hx of ETOH abuse  . Drug use: No    ROS:  General: Negative for a  fever, chills, fatigue, weakness.  See HPI ENT: Negative for hoarseness, difficulty swallowing , nasal congestion. CV: Negative for chest pain, angina, palpitations, dyspnea on exertion, peripheral edema.  Respiratory: Negative for dyspnea at rest, dyspnea on exertion, cough, sputum, wheezing.  GI: See history of present illness. GU:  Negative for dysuria, hematuria, urinary incontinence, urinary frequency, nocturnal urination.  Endo: See HPI   Physical Examination:   BP 115/85   Pulse 84   Temp (!) 97 F (36.1 C) (Oral)   Ht 5' 4" (1.626 m)   Wt 171 lb 6.4 oz (77.7 kg)   BMI 29.42 kg/m   General: Well-nourished, well-developed in no acute distress.  Accompanied by son. Eyes: No icterus. Mouth: Oropharyngeal mucosa moist and pink , no lesions erythema or exudate. Lungs: Clear to auscultation bilaterally.  Heart: Regular rate and rhythm, no murmurs rubs or gallops.  Abdomen: Bowel sounds are normal, nontender, nondistended, no hepatosplenomegaly or masses, no abdominal bruits or hernia , no rebound or  guarding.   Extremities: No lower extremity edema. No clubbing or deformities. Neuro: Alert and oriented x 4   Skin: Warm and dry, no jaundice.   Psych: Alert and cooperative, normal mood and affect.  Labs:  Lab Results  Component Value Date   CREATININE 1.12 (H) 06/11/2019   BUN   10 06/11/2019   NA 135 06/11/2019   K 4.7 06/11/2019   CL 103 06/11/2019   CO2 18 (L) 06/11/2019   Lab Results  Component Value Date   WBC 8.7 06/11/2019   HGB 12.5 06/11/2019   HCT 38.7 06/11/2019   MCV 89 06/11/2019   PLT 325 06/11/2019   Lab Results  Component Value Date   ALT 11 06/04/2019   AST 12 (L) 06/04/2019   ALKPHOS 86 06/04/2019   BILITOT 1.6 (H) 06/04/2019     Lab Results  Component Value Date   HGBA1C 7.4 (H) 06/04/2019    Imaging Studies: CT HEAD WO CONTRAST  Result Date: 06/04/2019 CLINICAL DATA:  Encephalopathy EXAM: CT HEAD WITHOUT CONTRAST TECHNIQUE: Contiguous axial images were obtained from the base of the skull through the vertex without intravenous contrast. COMPARISON:  08/25/2017 FINDINGS: Brain: No evidence of acute infarction, hemorrhage, hydrocephalus, extra-axial collection or mass lesion/mass effect. Vascular: No hyperdense vessel or unexpected calcification. Skull: Normal. Negative for fracture or focal lesion. Sinuses/Orbits: No acute finding. IMPRESSION: Negative head CT. Electronically Signed   By: Jonathon  Watts M.D.   On: 06/04/2019 06:09   US RENAL  Result Date: 06/04/2019 CLINICAL DATA:  Acute renal failure EXAM: RENAL / URINARY TRACT ULTRASOUND COMPLETE COMPARISON:  CT 04/27/2017 FINDINGS: Right Kidney: Renal measurements: 11.5 x 5.2 x 5.9 cm = volume: 186 mL. Increased renal cortical echogenicity. No mass or hydronephrosis visualized. Left Kidney: Renal measurements: 9.8 x 5.3 x 5.2 cm = volume: 142 mL. Increased renal cortical echogenicity. No mass or hydronephrosis visualized. Bladder: Appears normal for degree of bladder distention. Other: None.  IMPRESSION: 1. Negative for obstructive uropathy. 2. Findings suggesting medical renal disease. Electronically Signed   By: Nicholas  Plundo D.O.   On: 06/04/2019 12:23       

## 2019-06-15 ENCOUNTER — Ambulatory Visit (INDEPENDENT_AMBULATORY_CARE_PROVIDER_SITE_OTHER): Payer: Medicare Other | Admitting: Gastroenterology

## 2019-06-15 ENCOUNTER — Other Ambulatory Visit: Payer: Self-pay

## 2019-06-15 ENCOUNTER — Encounter: Payer: Self-pay | Admitting: Gastroenterology

## 2019-06-15 VITALS — BP 115/85 | HR 84 | Temp 97.0°F | Ht 64.0 in | Wt 171.4 lb

## 2019-06-15 DIAGNOSIS — R112 Nausea with vomiting, unspecified: Secondary | ICD-10-CM

## 2019-06-15 DIAGNOSIS — R634 Abnormal weight loss: Secondary | ICD-10-CM | POA: Insufficient documentation

## 2019-06-15 DIAGNOSIS — K219 Gastro-esophageal reflux disease without esophagitis: Secondary | ICD-10-CM

## 2019-06-15 MED ORDER — ONDANSETRON HCL 4 MG PO TABS: 4.0000 mg | ORAL_TABLET | ORAL | 0 refills | Status: DC | PRN

## 2019-06-15 NOTE — Patient Instructions (Signed)
PA for EGD submitted via [facility] website. Case approved. PA# [id number], valid 06/18/19-09/16/19.

## 2019-06-15 NOTE — Assessment & Plan Note (Addendum)
Pleasant 54 year old female with prior history of alcohol abuse, diabetes, posttraumatic stress disorder, schizoaffective disorder, GERD presenting for several week history of persistent nausea vomiting, significant abnormal unintentional weight loss.  Recent hospitalization for lithium toxicity, acute kidney injury.  Underwent temporary hemodialysis.  Patient states that nausea vomiting did not improve after correction of lithium toxicity.  Current lithium level actually low.  Differential diagnosis includes gastroparesis, medication side effect, dyspepsia, gastritis, peptic ulcer disease, biliary.  She will continue Nexium 40 mg twice daily before meals.  She can increase Zofran to 4 mg every 4 hours as needed for nausea and vomiting.  Encouraged her to continue oral intake, enough to keep urine light yellow.  If unable to do so, she should slide.  Plan for upper endoscopy with deep sedation in the near future.  I have discussed the risks, alternatives, benefits with regards to but not limited to the risk of reaction to medication, bleeding, infection, perforation and the patient is agreeable to proceed. Written consent to be obtained.  If EGD is unremarkable, will consider gastric emptying study and/or gallbladder work-up.

## 2019-06-15 NOTE — Patient Instructions (Addendum)
80    Your procedure is scheduled on: 06/18/2019  Report to [facility] at     1:00 PM.  Call this number if you have problems the morning of surgery: [phone]   Remember:   Do not drink or eat food:After Midnight.        No Smoking the day of procedure      Take these medicines the morning of surgery with A SIP OF WATER: Zoloft, Nexium, Spirolactone, losartan, metoprolol, and perphenazine               No diabetic medication morning of procedure              Take only 1/2 dose of lantus the night before procedure 8 units   Do not wear jewelry, make-up or nail polish.  Do not wear lotions, powders, or perfumes. You may wear deodorant.                Do not bring valuables to the hospital.  Contacts, dentures or bridgework may not be worn into surgery.  Leave suitcase in the car. After surgery it may be brought to your room.  For patients admitted to the hospital, checkout time is 11:00 AM the day of discharge.   Patients discharged the day of surgery will not be allowed to drive home. Upper Endoscopy, Adult Upper endoscopy is a procedure to look inside the upper GI (gastrointestinal) tract. The upper GI tract is made up of:  The part of the body that moves food from your mouth to your stomach (esophagus).  The stomach.  The first part of your small intestine (duodenum). This procedure is also called esophagogastroduodenoscopy (EGD) or gastroscopy. In this procedure, your health care provider passes a thin, flexible tube (endoscope) through your mouth and down your esophagus into your stomach. A small camera is attached to the end of the tube. Images from the camera appear on a monitor in the exam room. During this procedure, your health care provider may also remove a small piece of tissue to be sent to a lab and examined under a microscope (biopsy). Your health care provider may do an upper endoscopy to diagnose cancers of the upper GI tract. You may also have this procedure to find  the cause of other conditions, such as:  Stomach pain.  Heartburn.  Pain or problems when swallowing.  Nausea and vomiting.  Stomach bleeding.  Stomach ulcers. Tell a health care provider about:  Any allergies you have.  All medicines you are taking, including vitamins, herbs, eye drops, creams, and over-the-counter medicines.  Any problems you or family members have had with anesthetic medicines.  Any blood disorders you have.  Any surgeries you have had.  Any medical conditions you have.  Whether you are pregnant or may be pregnant. What are the risks? Generally, this is a safe procedure. However, problems may occur, including:  Infection.  Bleeding.  Allergic reactions to medicines.  A tear or hole (perforation) in the esophagus, stomach, or duodenum. What happens before the procedure? Staying hydrated Follow instructions from your health care provider about hydration, which may include:  Up to 2 hours before the procedure - you may continue to drink clear liquids, such as water, clear fruit juice, black coffee, and plain tea.  Eating and drinking restrictions Follow instructions from your health care provider about eating and drinking, which may include:  8 hours before the procedure - stop eating heavy meals or foods, such as meat,  fried foods, or fatty foods.  6 hours before the procedure - stop eating light meals or foods, such as toast or cereal.  6 hours before the procedure - stop drinking milk or drinks that contain milk.  2 hours before the procedure - stop drinking clear liquids. Medicines Ask your health care provider about:  Changing or stopping your regular medicines. This is especially important if you are taking diabetes medicines or blood thinners.  Taking medicines such as aspirin and ibuprofen. These medicines can thin your blood. Do not take these medicines unless your health care provider tells you to take them.  Taking  over-the-counter medicines, vitamins, herbs, and supplements. General instructions  Plan to have someone take you home from the hospital or clinic.  If you will be going home right after the procedure, plan to have someone with you for 24 hours.  Ask your health care provider what steps will be taken to help prevent infection. What happens during the procedure?  1. An IV will be inserted into one of your veins. 2. You may be given one or more of the following: ? A medicine to help you relax (sedative). ? A medicine to numb the throat (local anesthetic). 3. You will lie on your left side on an exam table. 4. Your health care provider will pass the endoscope through your mouth and down your esophagus. 5. Your health care provider will use the scope to check the inside of your esophagus, stomach, and duodenum. Biopsies may be taken. 6. The endoscope will be removed. The procedure may vary among health care providers and hospitals. What happens after the procedure?  Your blood pressure, heart rate, breathing rate, and blood oxygen level will be monitored until you leave the hospital or clinic.  Do not drive for 24 hours if you were given a sedative during your procedure.  When your throat is no longer numb, you may be given some fluids to drink.  It is up to you to get the results of your procedure. Ask your health care provider, or the department that is doing the procedure, when your results will be ready. Summary  Upper endoscopy is a procedure to look inside the upper GI tract.  During the procedure, an IV will be inserted into one of your veins. You may be given a medicine to help you relax.  A medicine will be used to numb your throat.  The endoscope will be passed through your mouth and down your esophagus. This information is not intended to replace advice given to you by your health care provider. Make sure you discuss any questions you have with your health care  provider. Document Revised: 09/25/2017 Document Reviewed: 09/02/2017 Elsevier Patient Education  [place].  EndoscopyCare After  Please read the instructions outlined below and refer to this sheet in the next few weeks. These discharge instructions provide you with general information on caring for yourself after you leave the hospital. Your doctor may also give you specific instructions. While your treatment has been planned according to the most current medical practices available, unavoidable complications occasionally occur. If you have any problems or questions after discharge, please call your doctor. HOME CARE INSTRUCTIONS Activity  You may resume your regular activity but move at a slower pace for the next 24 hours.   Take frequent rest periods for the next 24 hours.   Walking will help expel (get rid of) the air and reduce the bloated feeling in your abdomen.   No driving for 24 hours (because of the anesthesia (medicine) used during the test).   You may shower.   Do not sign any important legal documents or operate any machinery for 24 hours (because of the anesthesia used during the test).  Nutrition  Drink plenty of fluids.   You may resume your normal diet.   Begin with a light meal and progress to your normal diet.   Avoid alcoholic beverages for 24 hours or as instructed by your caregiver.  Medications You may resume your normal medications unless your caregiver tells you otherwise. What you can expect today  You may experience abdominal discomfort such as a feeling of fullness or "gas" pains.   You may experience a sore throat for 2 to 3 days. This is normal. Gargling with salt water may help this.  Follow-up Your doctor will discuss the results of your test with you. SEEK IMMEDIATE MEDICAL CARE IF:  You have excessive  nausea (feeling sick to your stomach) and/or vomiting.   You have severe abdominal pain and distention (swelling).   You have trouble swallowing.   You have a temperature over 100 F (37.8 C).   You have rectal bleeding or vomiting of blood.  Document Released: 11/15/2003 Document Revised: 03/22/2011 Document Reviewed: 05/28/2007

## 2019-06-15 NOTE — Patient Instructions (Signed)
1. I sent in new RX for Zofran for nausea. You can take every four hours as needed for nausea/vomiting. You can finish up your Zofran you have at home first and then start this one. The new RX is the same medicine but it is a tablet you swallow instead of one that dissolves under the tongue.  2. Upper endoscopy as scheduled see separate instructions.  3. If you are very nauseated and or vomiting, try to sip on fluid, one tablespoon every 10-15 minutes to stay hydrated, to keep your urine light yellow in color.  4. Continue Nexium twice daily before a meal.

## 2019-06-16 ENCOUNTER — Encounter (HOSPITAL_COMMUNITY): Payer: Self-pay

## 2019-06-16 ENCOUNTER — Other Ambulatory Visit (HOSPITAL_COMMUNITY)
Admission: RE | Admit: 2019-06-16 | Discharge: 2019-06-16 | Disposition: A | Discharge status: Home / Self Care | Payer: Medicare Other | Source: Ambulatory Visit | Attending: Internal Medicine | Admitting: Internal Medicine

## 2019-06-16 ENCOUNTER — Encounter (HOSPITAL_COMMUNITY)
Admission: RE | Admit: 2019-06-16 | Discharge: 2019-06-16 | Disposition: A | Discharge status: Home / Self Care | Payer: Medicare Other | Source: Ambulatory Visit | Attending: Internal Medicine | Admitting: Internal Medicine

## 2019-06-16 DIAGNOSIS — Z01818 Encounter for other preprocedural examination: Secondary | ICD-10-CM | POA: Diagnosis present

## 2019-06-16 DIAGNOSIS — I10 Essential (primary) hypertension: Secondary | ICD-10-CM | POA: Insufficient documentation

## 2019-06-16 DIAGNOSIS — Z20822 Contact with and (suspected) exposure to covid-19: Secondary | ICD-10-CM | POA: Insufficient documentation

## 2019-06-16 NOTE — Telephone Encounter (Signed)
Was seen 2/25 and was addressed

## 2019-06-17 ENCOUNTER — Encounter: Payer: Self-pay | Admitting: Internal Medicine

## 2019-06-17 ENCOUNTER — Ambulatory Visit (INDEPENDENT_AMBULATORY_CARE_PROVIDER_SITE_OTHER): Payer: Medicare Other | Admitting: Internal Medicine

## 2019-06-17 ENCOUNTER — Telehealth: Payer: Self-pay | Admitting: *Deleted

## 2019-06-17 DIAGNOSIS — M549 Dorsalgia, unspecified: Secondary | ICD-10-CM | POA: Diagnosis not present

## 2019-06-17 LAB — SARS CORONAVIRUS 2 (TAT 6-24 HRS): SARS Coronavirus 2: NEGATIVE

## 2019-06-17 NOTE — Telephone Encounter (Signed)
Called patient. Procedure time for tomorrow has been moved up to 10:15am. Aware to arrive at 8:45am. She can have clear liquids until midnight and then npo. She voiced understanding. Endo aware.

## 2019-06-17 NOTE — Patient Instructions (Addendum)
Thank you for trusting me with your care. To recap, today we discussed the following:  Diffuse back pain [name] your back pain was evaluated and had no concerning signs for infection or nerve impingement. I recommend stretching and exercise.   In addition I recommend following up with your psychiatrist to discuss your medication regimen.    My best,  Tamsen Snider

## 2019-06-17 NOTE — Progress Notes (Signed)
Internal Medicine Clinic Attending ° °Case discussed with Dr. Steen  at the time of the visit.  We reviewed the resident’s history and exam and pertinent patient test results.  I agree with the assessment, diagnosis, and plan of care documented in the resident’s note.  °

## 2019-06-17 NOTE — Assessment & Plan Note (Signed)
  Back pain: Patient presents with 2 weeks of back pain.  She describes her back pain is sharp and all over her back.  The pain is not reproducible on exam.  She requested pain medication other than Tylenol and Ibuprofen.  No focal areas concerning for infection or specific nerve pattern concerning for impingement.  Recently had lithium toxicity requiring dialysis .Lithium 6 days ago (.2). Started on perphenazine and Benztropine when lithium was stopped. This could be do to somatization or patient adjusting to new medications. Recommended patient following up today with her Psychiatrist.  Plan: - recommended stretches and daily exercise.  - F/u if patient does not see improvement or pain worsens.

## 2019-06-17 NOTE — Progress Notes (Signed)
   CC: Back pain  HPI:[name] is a 54 y.o. female who presents for evaluation of diffuse back pain. Please see individual problem based A/P for details.   Past Medical History:  Diagnosis Date  . Alcohol abuse   . Anemia, iron deficiency   . Arteriosclerotic cardiovascular disease (ASCVD)    Minimal at cath in [facility].stress nuclear study in 8/08 with nl EF; neg stress echo in 2010  . Community acquired pneumonia 01/03/10, 05/2010, 04/2012   2011; with pleural effusion-[facility] acute resp failure; intubated in Jan 2014 (HMPV pneumonia)  . Depression   . Diabetes mellitus, type 2 ([place]) 2000   Onset in 2000; no insulin  . Diarrhea 10/14/2013   Started 10/10/13, improved with Imodium.   . Diastolic dysfunction    grade 2 per echo 2011  . Dysphagia   . Gastroesophageal reflux disease    Schatzki's ring  . History of alcohol abuse 07/22/2007   Qualifier: Diagnosis of  By: Lenn Cal    . Hyperlipidemia   . Hypertension `   during treatment with Geodon  . Hypokalemia 12/25/2013  . Left knee pain 08/25/2014  . Obesity   . Oral candidiasis 05/16/2017  . PTSD (post-traumatic stress disorder)   . Pulmonary hypertension ([place]) 05/02/2012   Patient needs repeat echo in 06/2012   . Schizoaffective disorder    requiring multiple psychiatric admissions  . Viral URI 05/12/2013  . Viral wart on finger 10/08/2016  . Vision changes 08/12/2014   Review of Systems:  ROS negative except as per HPI.  Physical Exam: Vitals:   06/17/19 0912  BP: 119/66  Pulse: 80  SpO2: 99%  Weight: 171 lb 9.6 oz (77.8 kg)    General: Alert, nl appearance HE: Normocephalic, atraumatic , EOMI, PERRL Cardiovascular: Normal rate, regular rhythm.  No murmurs, rubs, or gallops Pulmonary : Effort normal, breath sounds normal. No wheezes, rales, or rhonchi Abdominal: soft, nontender, bowel sounds normal Musculoskeletal: bilateral negative straight leg raise, back non tender on palpation, no  focal areas of tenderness Skin: Warm, dry , no bruising, erythema, or rash Neurological: alert and oriented x4 , no weakness, no sensory deficit  Psychiatric/Behavioral:  depressed mood, normal behavior     Assessment & Plan:   See Encounters Tab for problem based charting.  Patient discussed with Dr. Dareen Piano

## 2019-06-18 ENCOUNTER — Telehealth: Payer: Self-pay | Admitting: Internal Medicine

## 2019-06-18 ENCOUNTER — Ambulatory Visit (HOSPITAL_COMMUNITY): Payer: Medicare Other | Admitting: Anesthesiology

## 2019-06-18 ENCOUNTER — Encounter (HOSPITAL_COMMUNITY): Payer: Self-pay | Admitting: Internal Medicine

## 2019-06-18 ENCOUNTER — Encounter (HOSPITAL_COMMUNITY)
Admission: RE | Disposition: A | Discharge status: Home / Self Care | Payer: Self-pay | Source: Home / Self Care | Attending: Internal Medicine

## 2019-06-18 ENCOUNTER — Ambulatory Visit (HOSPITAL_COMMUNITY)
Admission: RE | Admit: 2019-06-18 | Discharge: 2019-06-18 | Disposition: A | Discharge status: Home / Self Care | Payer: Medicare Other | Attending: Internal Medicine | Admitting: Internal Medicine

## 2019-06-18 DIAGNOSIS — J449 Chronic obstructive pulmonary disease, unspecified: Secondary | ICD-10-CM | POA: Diagnosis not present

## 2019-06-18 DIAGNOSIS — I2510 Atherosclerotic heart disease of native coronary artery without angina pectoris: Secondary | ICD-10-CM | POA: Diagnosis not present

## 2019-06-18 DIAGNOSIS — R12 Heartburn: Secondary | ICD-10-CM | POA: Diagnosis not present

## 2019-06-18 DIAGNOSIS — I272 Pulmonary hypertension, unspecified: Secondary | ICD-10-CM | POA: Diagnosis not present

## 2019-06-18 DIAGNOSIS — Z79899 Other long term (current) drug therapy: Secondary | ICD-10-CM | POA: Diagnosis not present

## 2019-06-18 DIAGNOSIS — R112 Nausea with vomiting, unspecified: Secondary | ICD-10-CM | POA: Insufficient documentation

## 2019-06-18 DIAGNOSIS — K219 Gastro-esophageal reflux disease without esophagitis: Secondary | ICD-10-CM | POA: Diagnosis not present

## 2019-06-18 DIAGNOSIS — F259 Schizoaffective disorder, unspecified: Secondary | ICD-10-CM | POA: Diagnosis not present

## 2019-06-18 DIAGNOSIS — F17210 Nicotine dependence, cigarettes, uncomplicated: Secondary | ICD-10-CM | POA: Insufficient documentation

## 2019-06-18 DIAGNOSIS — Z6829 Body mass index (BMI) 29.0-29.9, adult: Secondary | ICD-10-CM | POA: Insufficient documentation

## 2019-06-18 DIAGNOSIS — F4310 Post-traumatic stress disorder, unspecified: Secondary | ICD-10-CM | POA: Diagnosis not present

## 2019-06-18 DIAGNOSIS — F319 Bipolar disorder, unspecified: Secondary | ICD-10-CM | POA: Insufficient documentation

## 2019-06-18 DIAGNOSIS — Z794 Long term (current) use of insulin: Secondary | ICD-10-CM | POA: Diagnosis not present

## 2019-06-18 DIAGNOSIS — I10 Essential (primary) hypertension: Secondary | ICD-10-CM | POA: Insufficient documentation

## 2019-06-18 DIAGNOSIS — Z8249 Family history of ischemic heart disease and other diseases of the circulatory system: Secondary | ICD-10-CM | POA: Insufficient documentation

## 2019-06-18 DIAGNOSIS — E119 Type 2 diabetes mellitus without complications: Secondary | ICD-10-CM | POA: Insufficient documentation

## 2019-06-18 DIAGNOSIS — E669 Obesity, unspecified: Secondary | ICD-10-CM | POA: Diagnosis not present

## 2019-06-18 DIAGNOSIS — E785 Hyperlipidemia, unspecified: Secondary | ICD-10-CM | POA: Insufficient documentation

## 2019-06-18 DIAGNOSIS — F419 Anxiety disorder, unspecified: Secondary | ICD-10-CM | POA: Insufficient documentation

## 2019-06-18 HISTORY — PX: ESOPHAGOGASTRODUODENOSCOPY (EGD) WITH PROPOFOL: SHX5813

## 2019-06-18 LAB — GLUCOSE, CAPILLARY
Glucose-Capillary: 148 mg/dL — ABNORMAL HIGH (ref 70–99)
Glucose-Capillary: 138 mg/dL — ABNORMAL HIGH (ref 70–99)

## 2019-06-18 SURGERY — ESOPHAGOGASTRODUODENOSCOPY (EGD) WITH PROPOFOL
Anesthesia: General

## 2019-06-18 MED ORDER — PROPOFOL 500 MG/50ML IV EMUL
INTRAVENOUS | Status: DC | PRN
  Administered 2019-06-18: 150 ug/kg/min via INTRAVENOUS

## 2019-06-18 MED ORDER — CHLORHEXIDINE GLUCONATE CLOTH 2 % EX PADS: 6.0000 | MEDICATED_PAD | Freq: Once | CUTANEOUS | Status: DC

## 2019-06-18 MED ORDER — LACTATED RINGERS IV SOLN: INTRAVENOUS | Status: DC | PRN

## 2019-06-18 MED ORDER — KETAMINE HCL 10 MG/ML IJ SOLN
INTRAMUSCULAR | Status: DC | PRN
  Administered 2019-06-18: 30 mg via INTRAVENOUS
  Administered 2019-06-18: 10 mg via INTRAVENOUS

## 2019-06-18 MED ORDER — LIDOCAINE HCL (CARDIAC) PF 100 MG/5ML IV SOSY
PREFILLED_SYRINGE | INTRAVENOUS | Status: DC | PRN
  Administered 2019-06-18: 50 mg via INTRAVENOUS

## 2019-06-18 MED ORDER — LACTATED RINGERS IV SOLN: Freq: Once | INTRAVENOUS | Status: AC

## 2019-06-18 MED ORDER — PROPOFOL 10 MG/ML IV BOLUS
INTRAVENOUS | Status: DC | PRN
  Administered 2019-06-18: 20 mg via INTRAVENOUS

## 2019-06-18 MED ORDER — GLYCOPYRROLATE 0.2 MG/ML IJ SOLN
INTRAMUSCULAR | Status: DC | PRN
  Administered 2019-06-18: .2 mg via INTRAVENOUS

## 2019-06-18 MED ORDER — KETAMINE HCL 50 MG/5ML IJ SOSY
PREFILLED_SYRINGE | INTRAMUSCULAR | Status: AC
  Filled 2019-06-18: qty 5

## 2019-06-18 NOTE — Interval H&P Note (Signed)
History and Physical Interval Note:  06/18/2019 10:59 AM  [name]  has presented today for surgery, with the diagnosis of nausea/vomiting, weight loss, GERD.  The various methods of treatment have been discussed with the patient and family. After consideration of risks, benefits and other options for treatment, the patient has consented to  Procedure(s) with comments: ESOPHAGOGASTRODUODENOSCOPY (EGD) WITH PROPOFOL (N/A) - 2:30pm - office moved to 10:15 as a surgical intervention.  The patient's history has been reviewed, patient examined, no change in status, stable for surgery.  I have reviewed the patient's chart and labs.  Questions were answered to the patient's satisfaction.     Seve Monette  No change.  Denies dysphagia.  Diagnostic EGD per plan.  The risks, benefits, limitations, alternatives and imponderables have been reviewed with the patient. Potential for esophageal dilation, biopsy, etc. have also been reviewed.  Questions have been answered. All parties agreeable.

## 2019-06-18 NOTE — Telephone Encounter (Signed)
ENDO STATED RMR WANTS PATIENT TO GAVE GASTRIC EMPTYING STUDY

## 2019-06-18 NOTE — Addendum Note (Signed)
Addended by: Cheron Every on: 06/18/2019 11:46 AM   Modules accepted: Orders

## 2019-06-18 NOTE — Telephone Encounter (Signed)
GES scheduled for Friday 5/12 at 8:00am, arrival 7:45am, npo midnight, no stomach medications, test can take up to 4 hrs.  LMTCB

## 2019-06-18 NOTE — Addendum Note (Signed)
Addended by: Lyndal Pulley on: 06/18/2019 07:49 AM   Modules accepted: Level of Service

## 2019-06-18 NOTE — Anesthesia Postprocedure Evaluation (Signed)
Anesthesia Post Note  Patient: CESIA SCHEMENAUER  Procedure(s) Performed: ESOPHAGOGASTRODUODENOSCOPY (EGD) WITH PROPOFOL (N/A )  Patient location during evaluation: PACU Anesthesia Type: General Level of consciousness: awake and alert Pain management: pain level controlled Vital Signs Assessment: post-procedure vital signs reviewed and stable Respiratory status: spontaneous breathing and nonlabored ventilation Cardiovascular status: stable Postop Assessment: no apparent nausea or vomiting Anesthetic complications: no     Last Vitals:  Vitals:   06/18/19 0956  BP: 122/76  Pulse: 65  Resp: 16  Temp: 36.8 C  SpO2: 98%    Last Pain:  Vitals:   06/18/19 1045  TempSrc:   PainSc: 0-No pain                 Jya Hughston Hristova

## 2019-06-18 NOTE — Telephone Encounter (Signed)
Per patient instructions from procedure today; "Schedule outpatient solid-phase gastric emptying study to evaluate for a lazy stomach"  Orders entered. Will schedule once patient is "discharged"

## 2019-06-18 NOTE — Telephone Encounter (Signed)
PA for GES approved via [facility] website. Auth# [id number] Dates 06/18/2019-08/02/2019

## 2019-06-18 NOTE — Op Note (Signed)
[name] Procedure Date: 06/18/2019 11:01 AM MRN: [id number] Date of Birth: 07/13/1965 Attending MD: Norvel Richards , MD CSN: [id number] Age: 54 Admit Type: Outpatient Procedure:                Upper GI endoscopy Indications:              Nausea with vomiting Providers:                Norvel Richards, MD, Janeece Riggers, RN, Nelma Rothman, Technician Referring MD:              Medicines:                Propofol per Anesthesia Complications:            No immediate complications. Estimated Blood Loss:     Estimated blood loss: none. Procedure:                Pre-Anesthesia Assessment:                           - Prior to the procedure, a History and Physical                            was performed, and patient medications and                            allergies were reviewed. The patient's tolerance of                            previous anesthesia was also reviewed. The risks                            and benefits of the procedure and the sedation                            options and risks were discussed with the patient.                            All questions were answered, and informed consent                            was obtained. Prior Anticoagulants: The patient has                            taken no previous anticoagulant or antiplatelet                            agents. ASA Grade Assessment: II - A patient with                            mild systemic disease. After reviewing the risks  and benefits, the patient was deemed in                            satisfactory condition to undergo the procedure.                           After obtaining informed consent, the endoscope was                            passed under direct vision. Throughout the                            procedure, the patient's blood pressure, pulse, and                            oxygen saturations were  monitored continuously. The                            [id number] ID:[id number]) scope was introduced through the                            mouth, and advanced to the second part of duodenum.                            The upper GI endoscopy was accomplished without                            difficulty. The patient tolerated the procedure                            well. Scope In: 11:10:19 AM Scope Out: 11:13:52 AM Total Procedure Duration: 0 hours 3 minutes 33 seconds  Findings:      The examined esophagus was normal.      The entire examined stomach was normal.      The duodenal bulb and second portion of the duodenum were normal. Impression:               - Normal esophagus.                           - Normal stomach.                           - Normal duodenal bulb and second portion of the                            duodenum.                           - No specimens collected. Need further evaluation                            of gastroparesis. Patient states she occasionally                            takes a  third Nexium daily with improvement in her                            symptoms. Sent hemoglobin A1c 7.4. Moderate Sedation:      Moderate (conscious) sedation was personally administered by an       anesthesia professional. The following parameters were monitored: oxygen       saturation, heart rate, blood pressure, respiratory rate, EKG, adequacy       of pulmonary ventilation, and response to care. Recommendation:           - Patient has a contact number available for                            emergencies. The signs and symptoms of potential                            delayed complications were discussed with the                            patient. Return to normal activities tomorrow.                            Written discharge instructions were provided to the                            patient.                           - Resume previous diet.                           -  Continue present medications. Follow-up Nexium.                            Trial of Protonix 40 mg twice daily. Proceed with a                            solid-phase gastric emptying study. Procedure Code(s):        --- Professional ---                           [phone], Esophagogastroduodenoscopy, flexible,                            transoral; diagnostic, including collection of                            specimen(s) by brushing or washing, when performed                            (separate procedure) Diagnosis Code(s):        --- Professional ---                           R11.2, Nausea with vomiting, unspecified CPT copyright 2019 American Medical Association. All rights reserved. The codes documented in  this report are preliminary and upon coder review may  be revised to meet current compliance requirements. Cristopher Estimable. Seleen Walter, MD Norvel Richards, MD 06/18/2019 11:22:52 AM This report has been signed electronically. Number of Addenda: 0

## 2019-06-18 NOTE — Discharge Instructions (Signed)
EGD Discharge instructions Please read the instructions outlined below and refer to this sheet in the next few weeks. These discharge instructions provide you with general information on caring for yourself after you leave the hospital. Your doctor may also give you specific instructions. While your treatment has been planned according to the most current medical practices available, unavoidable complications occasionally occur. If you have any problems or questions after discharge, please call your doctor. ACTIVITY  You may resume your regular activity but move at a slower pace for the next 24 hours.   Take frequent rest periods for the next 24 hours.   Walking will help expel (get rid of) the air and reduce the bloated feeling in your abdomen.   No driving for 24 hours (because of the anesthesia (medicine) used during the test).   You may shower.   Do not sign any important legal documents or operate any machinery for 24 hours (because of the anesthesia used during the test).  NUTRITION  Drink plenty of fluids.   You may resume your normal diet.   Begin with a light meal and progress to your normal diet.   Avoid alcoholic beverages for 24 hours or as instructed by your caregiver.  MEDICATIONS  You may resume your normal medications unless your caregiver tells you otherwise.  WHAT YOU CAN EXPECT TODAY  You may experience abdominal discomfort such as a feeling of fullness or "gas" pains.  FOLLOW-UP  Your doctor will discuss the results of your test with you.  SEEK IMMEDIATE MEDICAL ATTENTION IF ANY OF THE FOLLOWING OCCUR:  Excessive nausea (feeling sick to your stomach) and/or vomiting.   Severe abdominal pain and distention (swelling).   Trouble swallowing.   Temperature over 101 F (37.8 C).   Rectal bleeding or vomiting of blood.   Your esophagus and stomach appeared normal to date  Stop Nexium  Begin Protonix 40 mg twice daily for GERD, nausea and  vomiting  Schedule outpatient solid-phase gastric emptying study to evaluate for a lazy stomach  Office visit with [name] in 6 weeks  At patient request, I called [name] at [phone] and reviewed results.

## 2019-06-18 NOTE — Anesthesia Preprocedure Evaluation (Addendum)
Anesthesia Evaluation  Patient identified by MRN, date of birth, ID band Patient awake    Reviewed: Allergy & Precautions, NPO status , Patient's Chart, lab work & pertinent test results, reviewed documented beta blocker date and time   Airway Mallampati: III  TM Distance: >3 FB Neck ROM: Full    Dental  (+) Loose, Missing, Dental Advisory Given,    Pulmonary pneumonia, COPD, Current Smoker,    Pulmonary exam normal breath sounds clear to auscultation       Cardiovascular Exercise Tolerance: Good hypertension, Pt. on medications and Pt. on home beta blockers Normal cardiovascular exam Rhythm:Regular Rate:Normal  New EKG changes compared to 2017 EKG. Informed the patient and advised to go to  her primary care provider.  risks of anesthesia explained to the patient. Since it is a low risk procedure and patient doesn't have  chest pain or shortness breathing during rest or exertion, decided to provide anesthesia during the procedure.   Neuro/Psych PSYCHIATRIC DISORDERS Anxiety Depression Bipolar Disorder Schizophrenia    GI/Hepatic GERD  Medicated and Poorly Controlled,  Endo/Other  diabetes, Well Controlled, Type 2, Insulin Dependent, Oral Hypoglycemic Agents  Renal/GU      Musculoskeletal   Abdominal   Peds  Hematology  (+) anemia ,   Anesthesia Other Findings Diabetes mellitus type 2, controlled ([place]) Hyperlipidemia Obesity TOBACCO ABUSE Bipolar 1 disorder (HCC) Resistant hypertension Back pain Arteriosclerotic cardiovascular disease (ASCVD) Fatigue Nausea & vomiting IDA (iron deficiency anemia) Diastolic heart failure (HCC) Pulmonary hypertension (HCC) GERD (gastroesophageal reflux disease) Preventative health care COPD (chronic obstructive pulmonary disease) (HCC) Insomnia Daytime hypersomnolence Seasonal allergies IIH (idiopathic intracranial hypertension) Encounter for health education Immunity status  testing Schizoaffective disorder, bipolar type (HCC) Bipolar I disorder, most recent episode (or current) manic (HCC) Warts Lithium toxicity Yeast infection Abnormal intentional weight loss    Reproductive/Obstetrics                           Anesthesia Physical Anesthesia Plan  ASA: III  Anesthesia Plan: General   Post-op Pain Management:    Induction: Intravenous  PONV Risk Score and Plan:   Airway Management Planned: Nasal Cannula, Natural Airway and Simple Face Mask  Additional Equipment:   Intra-op Plan:   Post-operative Plan:   Informed Consent: I have reviewed the patients History and Physical, chart, labs and discussed the procedure including the risks, benefits and alternatives for the proposed anesthesia with the patient or authorized representative who has indicated his/her understanding and acceptance.     Dental advisory given  Plan Discussed with: CRNA  Anesthesia Plan Comments: (New EKG changes compared to 2017 EKG. Informed the patient and advised to go to  her primary care provider.  risks of anesthesia explained to the patient. Since it is a low risk procedure and patient doesn't have  chest pain or shortness breathing during rest or exertion, decided to provide anesthesia during the procedure.)       Anesthesia Quick Evaluation

## 2019-06-18 NOTE — Transfer of Care (Signed)
Immediate Anesthesia Transfer of Care Note  Patient: JIANNA GIUDICE  Procedure(s) Performed: ESOPHAGOGASTRODUODENOSCOPY (EGD) WITH PROPOFOL (N/A )  Patient Location: PACU  Anesthesia Type:General  Level of Consciousness: awake  Airway & Oxygen Therapy: Patient Spontanous Breathing  Post-op Assessment: Report given to RN and Post -op Vital signs reviewed and stable  Post vital signs: Reviewed and stable  Last Vitals:  Vitals Value Taken Time  BP 108/77 06/18/19 1121  Temp    Pulse 77 06/18/19 1123  Resp 23 06/18/19 1123  SpO2 98 % 06/18/19 1123  Vitals shown include unvalidated device data.  Last Pain:  Vitals:   06/18/19 1045  TempSrc:   PainSc: 0-No pain         Complications: No apparent anesthesia complications

## 2019-06-19 ENCOUNTER — Telehealth: Payer: Self-pay

## 2019-06-19 NOTE — Telephone Encounter (Signed)
Spoke with patient and she is aware of appt details. Nothing further needed

## 2019-06-19 NOTE — Telephone Encounter (Signed)
Received TC from pt, states she "was at [facility] this week and had an EKG and was told it didn't look good" wanting results. RN asked pt if Dr. Tarri Abernethy ordered EKG, pt states no, it was ordered by Dr. Gala Romney.  RN encouraged pt to call Dr. Roseanne Kaufman office for any results from tests/procedures which were ordered by Dr. Gala Romney, she verbalized understanding. SChaplin, RN,BSN

## 2019-06-19 NOTE — Telephone Encounter (Signed)
I reviewed the EKG. If the patient calls back she can be told that her EKG is consistent with prior EKGs. There is nothing more we need to do at this point. Thank you.

## 2019-06-23 ENCOUNTER — Telehealth: Payer: Self-pay | Admitting: Internal Medicine

## 2019-06-23 DIAGNOSIS — I5032 Chronic diastolic (congestive) heart failure: Secondary | ICD-10-CM

## 2019-06-23 NOTE — Telephone Encounter (Signed)
Pt requesting a call back about her EKG Test and is also requesting a Referral to the [facility].

## 2019-06-24 NOTE — Telephone Encounter (Signed)
RTC call to patient and she was informed of Dr. Jerrell Mylar instructions below from Friday:    "I reviewed the EKG. If the patient calls back she can be told that her EKG is consistent with prior EKGs. There is nothing more we need to do at this point. Thank you."   Pt verbalized understanding, but still requesting referral to [facility].  RN informed pt if she was an established patient, she should be able to call and just request an appt.  Pt states "she tried, but was told she hadn't been seen in 4 years and needs another referral".  Will forward to PCP. SChaplin, RN,BSN

## 2019-06-25 NOTE — Telephone Encounter (Signed)
Referral to re-establish placed. Thank you.

## 2019-06-26 ENCOUNTER — Encounter (HOSPITAL_COMMUNITY): Payer: Medicare Other

## 2019-07-01 ENCOUNTER — Other Ambulatory Visit (HOSPITAL_COMMUNITY): Payer: Medicare Other

## 2019-07-01 ENCOUNTER — Other Ambulatory Visit: Payer: Self-pay

## 2019-07-01 ENCOUNTER — Ambulatory Visit (INDEPENDENT_AMBULATORY_CARE_PROVIDER_SITE_OTHER): Payer: Medicare Other | Admitting: Clinical

## 2019-07-01 ENCOUNTER — Encounter (HOSPITAL_COMMUNITY): Admission: RE | Admit: 2019-07-01 | Payer: Medicare Other | Source: Ambulatory Visit

## 2019-07-01 DIAGNOSIS — F319 Bipolar disorder, unspecified: Secondary | ICD-10-CM | POA: Diagnosis not present
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
## 2019-07-08 ENCOUNTER — Ambulatory Visit: Payer: Self-pay | Admitting: Cardiology

## 2019-07-08 ENCOUNTER — Telehealth: Payer: Self-pay | Admitting: Internal Medicine

## 2019-07-08 NOTE — Telephone Encounter (Signed)
pls contact [phone], need medicine to help sleep

## 2019-07-08 NOTE — Telephone Encounter (Signed)
rtc to pt, she was very flat and dull. Almost seemed medicated or very stoic. She states she needs to sleep, she was hesitant to make appt but relented

## 2019-07-10 ENCOUNTER — Telehealth (HOSPITAL_COMMUNITY): Payer: Self-pay | Admitting: Psychiatry

## 2019-07-10 ENCOUNTER — Ambulatory Visit: Payer: Medicare Other

## 2019-07-10 NOTE — Telephone Encounter (Signed)
Ms. Sylvie Farrier, (pt's daughter) is concerned about her mother (the pt) being scheduled so far out as a New Pt. She is requesting doctor to look at medications and advise of any suggestions due to circumstances. Daughter thinks pt could be hospitalized if not seen sooner, or medication evaulated sooner.

## 2019-07-13 ENCOUNTER — Telehealth (HOSPITAL_COMMUNITY): Payer: Self-pay | Admitting: Psychiatry

## 2019-07-13 NOTE — Telephone Encounter (Signed)
[name], (pt's daughter) is concerned about her mother (the pt) being scheduled so far out as a New Pt. She is requesting doctor to look at medications and advise of any suggestions due to circumstances. Daughter thinks pt could be hospitalized if not seen sooner, or medication evaulated sooner.

## 2019-07-13 NOTE — Telephone Encounter (Signed)
I believe that the appointment was made in error. Please advise them to continue care at [facility]. Please also inform them of emergency resources if needed.

## 2019-07-13 NOTE — Telephone Encounter (Signed)
[name], (pt's daughter) is concerned about her mother (the pt) being scheduled so far out as a New Pt. She is requesting doctor to look at medications and advise of any suggestions due to circumstances. Daughter thinks pt could be hospitalized if not seen sooner, or medication evaulated sooner.

## 2019-07-14 ENCOUNTER — Other Ambulatory Visit: Payer: Self-pay

## 2019-07-14 ENCOUNTER — Emergency Department (HOSPITAL_COMMUNITY): Payer: Medicare Other

## 2019-07-14 ENCOUNTER — Emergency Department (HOSPITAL_COMMUNITY)
Admission: EM | Admit: 2019-07-14 | Discharge: 2019-07-15 | Disposition: A | Discharge status: Home / Self Care | Payer: Medicare Other | Attending: Emergency Medicine | Admitting: Emergency Medicine

## 2019-07-14 ENCOUNTER — Ambulatory Visit (HOSPITAL_COMMUNITY)
Admission: AD | Admit: 2019-07-14 | Discharge: 2019-07-14 | Disposition: A | Discharge status: Home / Self Care | Payer: Medicare Other | Source: Home / Self Care | Attending: Psychiatry | Admitting: Psychiatry

## 2019-07-14 DIAGNOSIS — F17210 Nicotine dependence, cigarettes, uncomplicated: Secondary | ICD-10-CM | POA: Insufficient documentation

## 2019-07-14 DIAGNOSIS — F310 Bipolar disorder, current episode hypomanic: Secondary | ICD-10-CM

## 2019-07-14 DIAGNOSIS — I10 Essential (primary) hypertension: Secondary | ICD-10-CM | POA: Insufficient documentation

## 2019-07-14 DIAGNOSIS — Z20822 Contact with and (suspected) exposure to covid-19: Secondary | ICD-10-CM | POA: Diagnosis not present

## 2019-07-14 DIAGNOSIS — R05 Cough: Secondary | ICD-10-CM

## 2019-07-14 DIAGNOSIS — Z794 Long term (current) use of insulin: Secondary | ICD-10-CM | POA: Diagnosis not present

## 2019-07-14 DIAGNOSIS — E119 Type 2 diabetes mellitus without complications: Secondary | ICD-10-CM | POA: Insufficient documentation

## 2019-07-14 DIAGNOSIS — F32A Depression, unspecified: Secondary | ICD-10-CM

## 2019-07-14 DIAGNOSIS — R45851 Suicidal ideations: Secondary | ICD-10-CM | POA: Diagnosis not present

## 2019-07-14 DIAGNOSIS — Z79899 Other long term (current) drug therapy: Secondary | ICD-10-CM | POA: Insufficient documentation

## 2019-07-14 DIAGNOSIS — F329 Major depressive disorder, single episode, unspecified: Secondary | ICD-10-CM

## 2019-07-14 DIAGNOSIS — F3131 Bipolar disorder, current episode depressed, mild: Secondary | ICD-10-CM | POA: Insufficient documentation

## 2019-07-14 DIAGNOSIS — R059 Cough, unspecified: Secondary | ICD-10-CM

## 2019-07-14 LAB — COMPREHENSIVE METABOLIC PANEL
ALT: 13 U/L (ref 0–44)
AST: 14 U/L — ABNORMAL LOW (ref 15–41)
Albumin: 4.2 g/dL (ref 3.5–5.0)
Alkaline Phosphatase: 51 U/L (ref 38–126)
Anion gap: 11 (ref 5–15)
BUN: 15 mg/dL (ref 6–20)
CO2: 20 mmol/L — ABNORMAL LOW (ref 22–32)
Calcium: 9.6 mg/dL (ref 8.9–10.3)
Chloride: 104 mmol/L (ref 98–111)
Creatinine, Ser: 1.14 mg/dL — ABNORMAL HIGH (ref 0.44–1.00)
GFR calc Af Amer: >60 mL/min (ref >60)
GFR calc non Af Amer: 56 mL/min — ABNORMAL LOW (ref >60)
Glucose, Bld: 226 mg/dL — ABNORMAL HIGH (ref 70–99)
Potassium: 4.2 mmol/L (ref 3.5–5.1)
Sodium: 135 mmol/L (ref 135–145)
Total Bilirubin: 0.7 mg/dL (ref 0.3–1.2)
Total Protein: 6.9 g/dL (ref 6.5–8.1)

## 2019-07-14 LAB — CBC WITH DIFFERENTIAL/PLATELET
Abs Immature Granulocytes: 0.03 10*3/uL (ref 0.00–0.07)
Basophils Absolute: 0 10*3/uL (ref 0.0–0.1)
Basophils Relative: 0 %
Eosinophils Absolute: 0.1 10*3/uL (ref 0.0–0.5)
Eosinophils Relative: 1 %
HCT: 45.5 % (ref 36.0–46.0)
Hemoglobin: 14.5 g/dL (ref 12.0–15.0)
Immature Granulocytes: 0 %
Lymphocytes Relative: 29 %
Lymphs Abs: 2.1 10*3/uL (ref 0.7–4.0)
MCH: 28.7 pg (ref 26.0–34.0)
MCHC: 31.9 g/dL (ref 30.0–36.0)
MCV: 89.9 fL (ref 80.0–100.0)
Monocytes Absolute: 0.5 10*3/uL (ref 0.1–1.0)
Monocytes Relative: 7 %
Neutro Abs: 4.5 10*3/uL (ref 1.7–7.7)
Neutrophils Relative %: 63 %
Platelets: 247 10*3/uL (ref 150–400)
RBC: 5.06 MIL/uL (ref 3.87–5.11)
RDW: 16.7 % — ABNORMAL HIGH (ref 11.5–15.5)
WBC: 7.1 10*3/uL (ref 4.0–10.5)
nRBC: 0 % (ref 0.0–0.2)

## 2019-07-14 LAB — RESPIRATORY PANEL BY RT PCR (FLU A&B, COVID)
Influenza A by PCR: NEGATIVE
Influenza B by PCR: NEGATIVE
SARS Coronavirus 2 by RT PCR: NEGATIVE

## 2019-07-14 LAB — CBG MONITORING, ED
Glucose-Capillary: 200 mg/dL — ABNORMAL HIGH (ref 70–99)
Glucose-Capillary: 128 mg/dL — ABNORMAL HIGH (ref 70–99)

## 2019-07-14 LAB — I-STAT BETA HCG BLOOD, ED (MC, WL, AP ONLY): I-stat hCG, quantitative: <5 m[IU]/mL (ref <5)

## 2019-07-14 LAB — ETHANOL: Alcohol, Ethyl (B): <10 mg/dL (ref <10)

## 2019-07-14 MED ORDER — LORAZEPAM 1 MG PO TABS
0.0000 mg | ORAL_TABLET | Freq: Four times a day (QID) | ORAL | Status: DC
  Administered 2019-07-14: 1 mg via ORAL
  Filled 2019-07-14: qty 1

## 2019-07-14 MED ORDER — SPIRONOLACTONE 25 MG PO TABS
50.0000 mg | ORAL_TABLET | Freq: Every day | ORAL | Status: DC
  Administered 2019-07-15: 50 mg via ORAL
  Filled 2019-07-14: qty 2

## 2019-07-14 MED ORDER — LORAZEPAM 2 MG/ML IJ SOLN: 0.0000 mg | Freq: Two times a day (BID) | INTRAMUSCULAR | Status: DC

## 2019-07-14 MED ORDER — ALBUTEROL SULFATE HFA 108 (90 BASE) MCG/ACT IN AERS: 1.0000 | INHALATION_SPRAY | Freq: Four times a day (QID) | RESPIRATORY_TRACT | Status: DC | PRN

## 2019-07-14 MED ORDER — AMLODIPINE BESYLATE 5 MG PO TABS
10.0000 mg | ORAL_TABLET | Freq: Every day | ORAL | Status: DC
  Administered 2019-07-15: 10 mg via ORAL
  Filled 2019-07-14 (×2): qty 2

## 2019-07-14 MED ORDER — INSULIN ASPART 100 UNIT/ML ~~LOC~~ SOLN
0.0000 [IU] | Freq: Every day | SUBCUTANEOUS | Status: DC
  Filled 2019-07-14: qty 0.05

## 2019-07-14 MED ORDER — SERTRALINE HCL 50 MG PO TABS
50.0000 mg | ORAL_TABLET | Freq: Every day | ORAL | Status: DC
  Administered 2019-07-15: 50 mg via ORAL
  Filled 2019-07-14 (×2): qty 1

## 2019-07-14 MED ORDER — BENZTROPINE MESYLATE 1 MG PO TABS
1.0000 mg | ORAL_TABLET | Freq: Every day | ORAL | Status: DC
  Administered 2019-07-14: 1 mg via ORAL
  Filled 2019-07-14: qty 1

## 2019-07-14 MED ORDER — PRAVASTATIN SODIUM 20 MG PO TABS: 10.0000 mg | ORAL_TABLET | Freq: Every day | ORAL | Status: DC

## 2019-07-14 MED ORDER — FERROUS SULFATE 325 (65 FE) MG PO TABS
325.0000 mg | ORAL_TABLET | ORAL | Status: DC
  Administered 2019-07-14: 325 mg via ORAL
  Filled 2019-07-14: qty 1

## 2019-07-14 MED ORDER — PERPHENAZINE 4 MG PO TABS
4.0000 mg | ORAL_TABLET | Freq: Three times a day (TID) | ORAL | Status: DC
  Administered 2019-07-14 – 2019-07-15 (×2): 4 mg via ORAL
  Filled 2019-07-14 (×2): qty 1

## 2019-07-14 MED ORDER — INSULIN ASPART 100 UNIT/ML ~~LOC~~ SOLN
0.0000 [IU] | Freq: Three times a day (TID) | SUBCUTANEOUS | Status: DC
  Administered 2019-07-14 – 2019-07-15 (×2): 3 [IU] via SUBCUTANEOUS
  Filled 2019-07-14: qty 0.15

## 2019-07-14 MED ORDER — THIAMINE HCL 100 MG PO TABS
100.0000 mg | ORAL_TABLET | Freq: Every day | ORAL | Status: DC
  Administered 2019-07-14: 100 mg via ORAL
  Filled 2019-07-14 (×2): qty 1

## 2019-07-14 MED ORDER — INSULIN GLARGINE 100 UNIT/ML ~~LOC~~ SOLN
10.0000 [IU] | Freq: Every day | SUBCUTANEOUS | Status: DC
  Administered 2019-07-14 – 2019-07-15 (×2): 10 [IU] via SUBCUTANEOUS
  Filled 2019-07-14 (×2): qty 0.1

## 2019-07-14 MED ORDER — METFORMIN HCL 500 MG PO TABS
1000.0000 mg | ORAL_TABLET | Freq: Two times a day (BID) | ORAL | Status: DC
  Administered 2019-07-15: 1000 mg via ORAL
  Filled 2019-07-14 (×2): qty 2

## 2019-07-14 MED ORDER — NICOTINE 7 MG/24HR TD PT24
7.0000 mg | MEDICATED_PATCH | Freq: Every day | TRANSDERMAL | Status: DC
  Filled 2019-07-14: qty 1

## 2019-07-14 MED ORDER — LORAZEPAM 2 MG/ML IJ SOLN: 0.0000 mg | Freq: Four times a day (QID) | INTRAMUSCULAR | Status: DC

## 2019-07-14 MED ORDER — PANTOPRAZOLE SODIUM 40 MG PO TBEC
40.0000 mg | DELAYED_RELEASE_TABLET | Freq: Two times a day (BID) | ORAL | Status: DC
  Administered 2019-07-14 – 2019-07-15 (×2): 40 mg via ORAL
  Filled 2019-07-14 (×2): qty 1

## 2019-07-14 MED ORDER — THIAMINE HCL 100 MG PO TABS
100.0000 mg | ORAL_TABLET | Freq: Every day | ORAL | Status: DC
  Administered 2019-07-15: 100 mg via ORAL

## 2019-07-14 MED ORDER — LORAZEPAM 1 MG PO TABS: 0.0000 mg | ORAL_TABLET | Freq: Two times a day (BID) | ORAL | Status: DC

## 2019-07-14 MED ORDER — THIAMINE HCL 100 MG/ML IJ SOLN: 100.0000 mg | Freq: Every day | INTRAMUSCULAR | Status: DC

## 2019-07-14 MED ORDER — SENNOSIDES-DOCUSATE SODIUM 8.6-50 MG PO TABS: 1.0000 | ORAL_TABLET | Freq: Every evening | ORAL | Status: DC | PRN

## 2019-07-14 MED ORDER — METOPROLOL TARTRATE 25 MG PO TABS
100.0000 mg | ORAL_TABLET | Freq: Two times a day (BID) | ORAL | Status: DC
  Administered 2019-07-14 – 2019-07-15 (×2): 100 mg via ORAL
  Filled 2019-07-14 (×3): qty 4

## 2019-07-14 NOTE — ED Notes (Signed)
Per [place] MD Pt is still under voluntary status. There is IVC paperwork at nurses station that appears to be started from previous shift but per MD pt is not to be IVC'd at this time.

## 2019-07-14 NOTE — ED Triage Notes (Signed)
States that [facility] sent her to be evaluated for COVID; symptoms include weakness, nausea and cough x " a couple of weeks." Denies fever, abdominal pain or chest pain. Unsure if she has been around anyone with covid.

## 2019-07-14 NOTE — ED Notes (Signed)
TeleCart to bedside.

## 2019-07-14 NOTE — ED Notes (Signed)
ED Provider at bedside. 
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
## 2019-07-14 NOTE — ED Provider Notes (Signed)
[facility] DEPT Provider Note   CSN: [id number] Arrival date & time: 07/14/19  1315     History Chief Complaint  Patient presents with  . Cough  . Mental Health Problem    [name] is a 54 y.o. female.  The history is provided by the patient and a relative.  Mental Health Problem Presenting symptoms: depression, suicidal thoughts and suicidal threats   Degree of incapacity (severity):  Mild Onset quality:  Gradual Timing:  Constant Progression:  Unchanged Chronicity:  Recurrent Context: not noncompliant   Treatment compliance:  Most of the time Relieved by:  Nothing Worsened by:  Nothing Associated symptoms: anhedonia and appetite change   Associated symptoms: no abdominal pain and no chest pain   Associated symptoms comment:  Also with cough for 1.5 weeks, seen by [facility] today, sent for medical clearance Risk factors: hx of mental illness        Past Medical History:  Diagnosis Date  . Alcohol abuse   . Anemia, iron deficiency   . Arteriosclerotic cardiovascular disease (ASCVD)    Minimal at cath in [facility].stress nuclear study in 8/08 with nl EF; neg stress echo in 2010  . Community acquired pneumonia 01/03/10, 05/2010, 04/2012   2011; with pleural effusion-hosp [facility] acute resp failure; intubated in Jan 2014 (HMPV pneumonia)  . Depression   . Diabetes mellitus, type 2 ([place]) 2000   Onset in 2000; no insulin  . Diarrhea 10/14/2013   Started 10/10/13, improved with Imodium.   . Diastolic dysfunction    grade 2 per echo 2011  . Dysphagia   . Gastroesophageal reflux disease    Schatzki's ring  . History of alcohol abuse 07/22/2007   Qualifier: Diagnosis of  By: Lenn Cal    . Hyperlipidemia   . Hypertension `   during treatment with Geodon  . Hypokalemia 12/25/2013  . Left knee pain 08/25/2014  . Obesity   . Oral candidiasis 05/16/2017  . PTSD (post-traumatic stress disorder)   . Pulmonary hypertension ([place])  05/02/2012   Patient needs repeat echo in 06/2012   . Schizoaffective disorder    requiring multiple psychiatric admissions  . Viral URI 05/12/2013  . Viral wart on finger 10/08/2016  . Vision changes 08/12/2014    Patient Active Problem List   Diagnosis Date Noted  . Abnormal intentional weight loss 06/15/2019  . Yeast infection 06/11/2019  . Lithium toxicity 06/03/2019  . Warts 02/19/2019  . Bipolar I disorder, most recent episode (or current) manic ([place]) 09/14/2018  . Schizoaffective disorder, bipolar type ([place]) 08/26/2017  . Immunity status testing 04/17/2017  . Encounter for health education 10/24/2014  . IIH (idiopathic intracranial hypertension) 08/12/2014  . Seasonal allergies 02/23/2014  . Daytime hypersomnolence 12/09/2012  . Insomnia 11/06/2012  . COPD (chronic obstructive pulmonary disease) ([place]) 10/20/2012  . GERD (gastroesophageal reflux disease) 10/15/2012  . Preventative health care 10/15/2012  . Pulmonary hypertension ([place]) 05/02/2012  . Diastolic heart failure ([place]) 02/07/2012  . IDA (iron deficiency anemia) 11/06/2011  . Nausea & vomiting 07/06/2011  . Fatigue 05/17/2011  . Arteriosclerotic cardiovascular disease (ASCVD)   . TOBACCO ABUSE 09/27/2009  . Back pain 12/13/2008  . Hyperlipidemia 07/22/2007  . Obesity 07/22/2007  . Bipolar 1 disorder ([place]) 07/22/2007  . Resistant hypertension 07/22/2007  . Diabetes mellitus type 2, controlled ([place]) 09/28/2002    Past Surgical History:  Procedure Laterality Date  . COLONOSCOPY  01/2006   internal hemorrhoids  . COLONOSCOPY  01/10/2012  Dr. Rourk:Single anal canal hemorrhoidal tag likely source of  trivial hematochezia; right-sided colonic diverticulosis  . DILATION AND CURETTAGE, DIAGNOSTIC / THERAPEUTIC  1992  . ESOPHAGEAL DILATION N/A 08/18/2015   Procedure: ESOPHAGEAL DILATION;  Surgeon: Daneil Dolin, MD;  Location: [facility];  Service: Endoscopy;  Laterality: N/A;  . ESOPHAGOGASTRODUODENOSCOPY  09/16/08    Dr. Trevor Iha hiatal hernia/excoriations involving the cardia and mucosa consistent with trauma, antral erosions  of linear petechiae ? gastritis versus early gastric antral vascular  ectasia.Marland Kitchen biopsy showed reactive gastropathy. No H. pylori.  . ESOPHAGOGASTRODUODENOSCOPY  09/2007   Dr. Evalee Mutton ring, dilated to 26 French Maloney dilator, small hiatal hernia, antral erosions, biopsies reactive gastropathy.  . ESOPHAGOGASTRODUODENOSCOPY (EGD) WITH PROPOFOL N/A 12/17/2013   QAS:TMHDQQI antral erosions and petechiae. Small hiatal hernia. No endoscopic explanation for patient's symptoms  . ESOPHAGOGASTRODUODENOSCOPY (EGD) WITH PROPOFOL N/A 08/18/2015   Dr. Gala Romney: normal exam, s/p esophageal dilation  . ESOPHAGOGASTRODUODENOSCOPY (EGD) WITH PROPOFOL N/A 06/18/2019   Procedure: ESOPHAGOGASTRODUODENOSCOPY (EGD) WITH PROPOFOL;  Surgeon: Daneil Dolin, MD;  Location: [facility];  Service: Endoscopy;  Laterality: N/A;  2:30pm - office moved to 10:15  . SAVORY DILATION  07/17/2011   Fields-MAC sedation-->distal esophageal stricture s/p dilation, chronic gastritis, multiple ulcers in stomach. no h.pylori     OB History    Gravida  3   Para  2   Term  2   Preterm      AB  1   Living  2     SAB      TAB  1   Ectopic      Multiple      Live Births  2           Family History  Problem Relation Age of Onset  . Hypertension Mother   . Stroke Father        deceased at age 36  . Colon cancer Other   . Heart disease Sister   . Diabetes Other   . High Cholesterol Other   . Arthritis Other   . Anesthesia problems Neg Hx   . Hypotension Neg Hx   . Malignant hyperthermia Neg Hx   . Pseudochol deficiency Neg Hx     Social History   Tobacco Use  . Smoking status: Current Some Day Smoker    Packs/day: 1.00    Years: 30.00    Pack years: 30.00    Types: Cigarettes    Start date: 05/18/2012  . Smokeless tobacco: Never Used  . Tobacco comment: 1 PPD  Substance Use  Topics  . Alcohol use: No    Alcohol/week: 0.0 standard drinks    Comment: hx of ETOH abuse  . Drug use: No    Home Medications Prior to Admission medications   Medication Sig Start Date End Date Taking? Authorizing Provider  Accu-Chek FastClix Lancets MISC Use to check you blood sugar 3 times daily 07/29/18   Helberg, Larkin Ina, MD  albuterol (VENTOLIN HFA) 108 (90 Base) MCG/ACT inhaler Inhale 1-2 puffs into the lungs every 6 (six) hours as needed for wheezing or shortness of breath. 09/24/17   Axel Filler, MD  amLODipine (NORVASC) 10 MG tablet Take 1 tablet (10 mg total) by mouth daily. For high blood pressure 12/17/18 12/17/19  Ina Homes, MD  benztropine (COGENTIN) 1 MG tablet Take 1 mg by mouth at bedtime.    [provider]  Blood Glucose Monitoring Suppl (ACCU-CHEK GUIDE) w/Device KIT 1 Device by Does  not apply route 3 (three) times daily. Use to check blood sugars 3 times daily. 07/29/18   Ina Homes, MD  ferrous sulfate 325 (65 FE) MG tablet Take 1 tablet (325 mg total) by mouth every other day. (May buy from over the counter): For anemia 09/24/17   Axel Filler, MD  glucose blood (ACCU-CHEK GUIDE) test strip Use as instructed 07/29/18   Ina Homes, MD  Insulin Glargine (LANTUS) 100 UNIT/ML Solostar Pen Inject 19 Units into the skin daily. Patient taking differently: Inject 15 Units into the skin daily.  02/19/19   Helberg, Larkin Ina, MD  Insulin Pen Needle (PEN NEEDLES) 31G X 5 MM MISC Inject 18 Units into the skin at bedtime. Use to inject insulin daily at bedtime: For diabetes management: 02/19/19   Ina Homes, MD  loratadine (CLARITIN) 10 MG tablet Take 1 tablet (10 mg total) by mouth daily. (May buy from over the counter): For allergies 07/25/18   Neva Seat, MD  losartan (COZAAR) 50 MG tablet Take 1 tablet (50 mg total) by mouth daily. 06/07/19   Mercy Riding, MD  lovastatin (MEVACOR) 40 MG tablet Take 1 tablet (40 mg total) by mouth  daily. Patient taking differently: Take 40 mg by mouth every evening.  09/04/18 06/04/19  Ina Homes, MD  magnesium gluconate (MAGONATE) 30 MG tablet Take 1 tablet (30 mg total) by mouth 2 (two) times daily. 06/12/19   Ina Homes, MD  metFORMIN (GLUCOPHAGE) 1000 MG tablet Take 1 tablet (1,000 mg total) by mouth 2 (two) times daily with a meal. For diabetes management 09/04/18   Ina Homes, MD  metoprolol tartrate (LOPRESSOR) 100 MG tablet Take 1 tablet (100 mg total) by mouth 2 (two) times daily. For high blood pressure 09/04/18   Helberg, Larkin Ina, MD  nicotine (NICODERM CQ - DOSED IN MG/24 HR) 7 mg/24hr patch Place 1 patch (7 mg total) onto the skin daily. 06/11/19   Helberg, Larkin Ina, MD  ondansetron (ZOFRAN) 4 MG tablet Take 1 tablet (4 mg total) by mouth every 4 (four) hours as needed for nausea or vomiting. 06/15/19   Mahala Menghini, PA-C  perphenazine (TRILAFON) 4 MG tablet Take 1 tablet (4 mg total) by mouth 3 (three) times daily. 06/05/19   Mercy Riding, MD  senna-docusate (SENOKOT-S) 8.6-50 MG tablet Take 1 tablet by mouth at bedtime as needed for mild constipation. 06/05/19   Mercy Riding, MD  sertraline (ZOLOFT) 50 MG tablet Take 50 mg by mouth daily.    [provider]  spironolactone (ALDACTONE) 50 MG tablet Take 1 tablet (50 mg total) by mouth daily. For high blood pressure 09/04/18   Helberg, Larkin Ina, MD  Thiamine HCl (VITAMIN B-1) 100 MG TABS Take 1 tablet (100 mg total) by mouth daily. 06/05/19   Mercy Riding, MD    Allergies    Cephalexin, Metronidazole, Orange, Orange oil, Other, Shrimp [shellfish allergy], Penicillins, Sulfonamide derivatives, Glipizide, Ace inhibitors, Sulfamethoxazole, Sulfamethoxazole-trimethoprim, and Trimethoprim  Review of Systems   Review of Systems  Constitutional: Positive for appetite change. Negative for chills and fever.  HENT: Negative for ear pain and sore throat.   Eyes: Negative for pain and visual disturbance.  Respiratory:  Positive for cough. Negative for shortness of breath.   Cardiovascular: Negative for chest pain and palpitations.  Gastrointestinal: Negative for abdominal pain and vomiting.  Genitourinary: Negative for dysuria and hematuria.  Musculoskeletal: Negative for arthralgias and back pain.  Skin: Negative for color change and rash.  Neurological: Negative  for seizures and syncope.  Psychiatric/Behavioral: Positive for suicidal ideas.  All other systems reviewed and are negative.   Physical Exam Updated Vital Signs BP 105/64 (BP Location: Right Arm)   Pulse 86   Temp 98.3 F (36.8 C) (Oral)   Resp 18   SpO2 98%   Physical Exam Vitals and nursing note reviewed.  Constitutional:      General: She is not in acute distress.    Appearance: She is well-developed. She is not ill-appearing.  HENT:     Head: Normocephalic and atraumatic.     Nose: Nose normal.     Mouth/Throat:     Mouth: Mucous membranes are moist.  Eyes:     Extraocular Movements: Extraocular movements intact.     Conjunctiva/sclera: Conjunctivae normal.     Pupils: Pupils are equal, round, and reactive to light.  Cardiovascular:     Rate and Rhythm: Normal rate and regular rhythm.     Pulses: Normal pulses.     Heart sounds: Normal heart sounds. No murmur.  Pulmonary:     Effort: Pulmonary effort is normal. No respiratory distress.     Breath sounds: Normal breath sounds.  Abdominal:     Palpations: Abdomen is soft.     Tenderness: There is no abdominal tenderness.  Musculoskeletal:     Cervical back: Normal range of motion and neck supple.  Skin:    General: Skin is warm and dry.     Capillary Refill: Capillary refill takes less than 2 seconds.  Neurological:     General: No focal deficit present.     Mental Status: She is alert.  Psychiatric:        Mood and Affect: Mood is depressed. Affect is flat.        Behavior: Behavior is slowed.        Thought Content: Thought content includes suicidal ideation.  Thought content does not include homicidal ideation. Thought content does not include suicidal plan.     ED Results / Procedures / Treatments   Labs (all labs ordered are listed, but only abnormal results are displayed) Labs Reviewed  COMPREHENSIVE METABOLIC PANEL - Abnormal; Notable for the following components:      Result Value   CO2 20 (*)    Glucose, Bld 226 (*)    Creatinine, Ser 1.14 (*)    AST 14 (*)    GFR calc non Af Amer 56 (*)    All other components within normal limits  CBC WITH DIFFERENTIAL/PLATELET - Abnormal; Notable for the following components:   RDW 16.7 (*)    All other components within normal limits  RESPIRATORY PANEL BY RT PCR (FLU A&B, COVID)  ETHANOL  RAPID URINE DRUG SCREEN, HOSP PERFORMED  I-STAT BETA HCG BLOOD, ED ([facility], [facility], [facility] ONLY)    EKG EKG Interpretation  Date/Time:  Tuesday July 14 2019 14:12:30 EDT Ventricular Rate:  81 PR Interval:    QRS Duration: 75 QT Interval:  400 QTC Calculation: 465 R Axis:   73 Text Interpretation: Sinus rhythm Short PR interval Right atrial enlargement Confirmed by Lennice Sites [phone]) on 07/14/2019 2:50:30 PM   Radiology DG Chest Portable 1 View  Result Date: 07/14/2019 CLINICAL DATA:  Cough EXAM: PORTABLE CHEST 1 VIEW COMPARISON:  05/28/2017 FINDINGS: The heart size and mediastinal contours are within normal limits. Both lungs are clear. The visualized skeletal structures are unremarkable. IMPRESSION: No active disease. Electronically Signed   By: Constance Holster M.D.   On: 07/14/2019  14:51    Procedures Procedures (including critical care time)  Medications Ordered in ED Medications - No data to display  ED Course  I have reviewed the triage vital signs and the nursing notes.  Pertinent labs & imaging results that were available during my care of the patient were reviewed by me and considered in my medical decision making (see chart for details).    MDM Rules/Calculators/A&P                       [name] is a 54 year old female with history of alcohol abuse, hypertension, high cholesterol, diabetes, schizoaffective disorder who presents the ED with cough, medical clearance, suicidal thoughts.  Patient with unremarkable vitals.  No fever.  Per patient's son patient has not been doing well.  Not eating, not drinking.  Having suicidal thoughts.  Went to behavioral health today and patient's son is under the impression that they would like to treat her inpatient for her depression and suicidal thoughts.  However they are concerned that she may have coronavirus as she has had a cough for a week and a half.  Sent here for medical clearance.  I will try to confirm psychiatry plan.  Patient is voluntarily here at this time.  Chest x-ray shows no signs of infection.  Overall she appears well.  Normal vitals.  No fever.  Medical clearance labs thus far unremarkable.  Awaiting Covid results.  Clear breath sounds on exam, no signs of respiratory distress.  No hypoxia.  If she does have coronavirus likely minimal symptoms. Anticipate psychiatric evaluation and possible admission for psychiatric illness, COVID test pending.  This chart was dictated using voice recognition software.  Despite best efforts to proofread,  errors can occur which can change the documentation meaning.  [name] was evaluated in Emergency Department on 07/14/2019 for the symptoms described in the history of present illness. She was evaluated in the context of the global COVID-19 pandemic, which necessitated consideration that the patient might be at risk for infection with the SARS-CoV-2 virus that causes COVID-19. Institutional protocols and algorithms that pertain to the evaluation of patients at risk for COVID-19 are in a state of rapid change based on information released by regulatory bodies including the CDC and federal and state organizations. These policies and algorithms were followed during the patient's care  in the ED.    Final Clinical Impression(s) / ED Diagnoses Final diagnoses:  Cough  Depression, unspecified depression type  Suicidal ideation    Rx / DC Orders ED Discharge Orders    None       Lennice Sites, DO 07/14/19 1506

## 2019-07-15 ENCOUNTER — Encounter (HOSPITAL_COMMUNITY): Payer: Self-pay | Admitting: Registered Nurse

## 2019-07-15 DIAGNOSIS — R05 Cough: Secondary | ICD-10-CM | POA: Diagnosis not present

## 2019-07-15 LAB — CBG MONITORING, ED
Glucose-Capillary: 150 mg/dL — ABNORMAL HIGH (ref 70–99)
Glucose-Capillary: 114 mg/dL — ABNORMAL HIGH (ref 70–99)

## 2019-07-15 MED ORDER — INSULIN GLARGINE 100 UNIT/ML ~~LOC~~ SOLN: 10.0000 [IU] | Freq: Every day | SUBCUTANEOUS | 11 refills | Status: DC

## 2019-07-15 MED ORDER — TRAZODONE HCL 50 MG PO TABS: ORAL_TABLET | ORAL | 0 refills | Status: DC

## 2019-07-15 NOTE — Discharge Instructions (Signed)
For your behavioral health needs, you are advised to follow up with the Partial Hospitalization Program (PHP) at the [facility] at [place].  This program meets Monday - Friday from 9:00 am - 2:00 pm.  Due to Covid-19, this program is currently meeting virtually.  You are scheduled for an intake appointment tomorrow, Thursday, July 16, 2019 at 10:00 am.  Please have the registration paperwork given to you by Emergency Department staff completed prior to the appointment.  If you have any questions, contact Loistine Chance at the phone number indicated below:       [facility] at [facility]. [address] [place], [place] [address]      Contact person: Loistine Chance      [phone]

## 2019-07-15 NOTE — BHH Suicide Risk Assessment (Cosign Needed)
Suicide Risk Assessment  Discharge Assessment   [facility] Discharge Suicide Risk Assessment   Principal Problem: Bipolar disorder, current episode hypomanic [facility]) Discharge Diagnoses: Principal Problem:   Bipolar disorder, current episode hypomanic ([place])   Total Time spent with patient: 30 minutes  Musculoskeletal: Strength & Muscle Tone: within normal limits Gait & Station: normal Patient leans: N/A  Psychiatric Specialty Exam:   Blood pressure 133/88, pulse 73, temperature 99.2 F (37.3 C), temperature source Oral, resp. rate 18, SpO2 96 %.There is no height or weight on file to calculate BMI.  General Appearance: Casual  Eye Contact::  Good  Speech:  Clear and Coherent and Normal Rate409  Volume:  Normal  Mood:  "Fine"  Affect:  Appropriate and Congruent  Thought Process:  Coherent, Goal Directed and Descriptions of Associations: Intact  Orientation:  Full (Time, Place, and Person)  Thought Content:  WDL and Logical  Suicidal Thoughts:  No  Homicidal Thoughts:  No  Memory:  Immediate;   Good Recent;   Good  Judgement:  Intact  Insight:  Present  Psychomotor Activity:  Normal  Concentration:  Good  Recall:  Good  Fund of Knowledge:Good  Language: Good  Akathisia:  No  Handed:  Right  AIMS (if indicated):     Assets:  Communication Skills Desire for Improvement Housing Resilience Social Support  Sleep:     Cognition: WNL  ADL's:  Intact   Mental Status Per Nursing Assessment::   On Admission:    [name], 54 y.o., female patient seen via tele psych by this provider, Dr. Dwyane Dee; and chart reviewed on 07/15/19.  On evaluation [name] reports she is feeling better this morning after a good night sleep.  Patient denies suicidal/self-harm/homicidal ideation, psychosis, and paranoia.  Patient states that she after dinner and breakfast without difficulty and hasn't had an incident of N/V.  States that she is tolerating her medications without adverse  reactions.  Patient did ask for medication to help her sleep.   During evaluation [name] is alert/oriented x 4; calm/cooperative; and mood is congruent with affect.  She does not appear to be responding to internal/external stimuli or delusional thoughts.  Patient denies suicidal/self-harm/homicidal ideation, psychosis, and paranoia.  Patient answered question appropriately.  Patient in agreement to be set up in PHP.       Demographic Factors:  Unemployed  Loss Factors: NA  Historical Factors: NA  Risk Reduction Factors:   Sense of responsibility to family, Religious beliefs about death, Living with another person, especially a relative and Positive social support  Continued Clinical Symptoms:  Previous Psychiatric Diagnoses and Treatments  Cognitive Features That Contribute To Risk:  None    Suicide Risk:  Minimal: No identifiable suicidal ideation.  Patients presenting with no risk factors but with morbid ruminations; may be classified as minimal risk based on the severity of the depressive symptoms    Plan Of Care/Follow-up recommendations:  Activity:  As tolerated Diet:  Heart healthy and Carb modified      Discharge Instructions     For your behavioral health needs, you are advised to follow up with the Partial Hospitalization Program (PHP) at the [facility] at [place].  This program meets Monday - Friday from 9:00 am - 2:00 pm.  Due to Covid-19, this program is currently meeting virtually.  You are scheduled for an intake appointment tomorrow, Thursday, July 16, 2019 at 10:00 am.  Please have the registration paperwork given to  you by Emergency Department staff completed prior to the appointment.  If you have any questions, contact Loistine Chance at the phone number indicated below:       [facility] at [facility]. [address] [place], [place] [address]      Contact person: Loistine Chance      [phone]    Disposition:  Psychiatrically cleared No evidence of imminent risk to self or others at present.   Patient does not meet criteria for psychiatric inpatient admission. Supportive therapy provided about ongoing stressors. Discussed crisis plan, support from social network, calling 911, coming to the Emergency Department, and calling Suicide Hotline. Referral to [place] Lawrance Wiedemann, NP 07/15/2019, 11:34 AM

## 2019-07-15 NOTE — Progress Notes (Signed)
Received [name] this PM from the main ED, she was oriented to her new environment and the sitter is at the bedside. She was compliant with her medications and insulin. She slept throughout the night.

## 2019-07-15 NOTE — BH Assessment (Signed)
[facility] Assessment Progress Note  Per [name], FNP, this violuntary pt does not require psychiatric hospitalization at this time.  Pt is to be discharged from [facility] with recommendation to follow up with the Partial Hospitalization Program (PHP) at the [facility] at [place].  This Probation officer has spoken to the patient, and she agrees with this plan.  At 11:00 I spoke to [place] who has scheduled pt for an intake appointment tomorrow, 07/16/2019 at 10:00 am.  This has been included in pt's discharge instructions.  I have confirmed that pt has Internet accessibility and an appropriate device for virtual programming, and I have given pt a registration packet for the Outpatient Clinic, instructing her to complete it prior to the appointment.  Pt's e-mail address and current phone number have been obtained from the pt and sent to [facility].  Pt's nurse, Diane, has been notified.  Jalene Mullet, [place] Triage Specialist [phone]

## 2019-07-15 NOTE — ED Notes (Signed)
Pt discharged home. Discharged instructions read to pt who verbalized understanding. All belongings returned to pt who signed for same. Denies SI/HI, is not delusional and not responding to internal stimuli. Escorted pt to the ED exit.    

## 2019-07-16 ENCOUNTER — Telehealth (HOSPITAL_COMMUNITY): Payer: Self-pay | Admitting: Professional

## 2019-07-16 ENCOUNTER — Ambulatory Visit (HOSPITAL_COMMUNITY): Payer: Medicare Other

## 2019-07-16 NOTE — Telephone Encounter (Signed)
Cln called pt to f/u on paperwork for today's [facility] appointment at [place]. Pt rpts she has not completed the paperwork yet. Cln orients to PHP. Pt reports she would be unable to afford her portion of the PHP bill. Cln discusses PHP opening soon that will accept Medicaid, too. Provides number for pt to call if feels needed in June [phone]). Cln spends time with pt discussing options including [facility] (pt reports call has been made- she is waiting on return call), and returning to providers. Cln is able to share pt has an appointment with Dr. Adele Schilder on 4/22 @ 9a virtually and encourages pt to call [place] office to schedule appointment with Maye Hides- provides number [phone]). Pt agrees. Pt denies current SI/HI/AVH. Pt knows to reach out to crisis hotline number (provided for pt: [phone]) or go to local ED if SI returns.

## 2019-07-22 ENCOUNTER — Ambulatory Visit: Payer: Medicare Other | Admitting: Cardiology

## 2019-07-23 ENCOUNTER — Other Ambulatory Visit: Payer: Self-pay | Admitting: Gastroenterology

## 2019-07-30 ENCOUNTER — Telehealth: Payer: Self-pay | Admitting: *Deleted

## 2019-07-30 NOTE — Telephone Encounter (Signed)
I agree. A BP of 99/80 is still low normal, but I agree with wanting her evaluated to determine if symptoms are related.

## 2019-07-30 NOTE — Telephone Encounter (Signed)
Daughter called in stating patient has had a 30 mm drop in BP this week. Usually runs 130s/100s and currently 99/80. Does not know HR as patient is currently with her grandparents and not her. States patient has had 2 week hx of dizziness and falls. Grandmother held one of the BP meds but she does not know which one. [facility] appt given tomorrow AM. Advised to take patient to ED before then if BP drops again or there is a change in mental status. Daughter is in agreement. Also, gave after hours number [id number]) with instructions to ask for [facility] Resident on call. Hubbard Hartshorn, BSN, RN-BC

## 2019-07-31 ENCOUNTER — Ambulatory Visit: Payer: Medicare Other

## 2019-08-03 ENCOUNTER — Telehealth: Payer: Self-pay | Admitting: *Deleted

## 2019-08-03 NOTE — Telephone Encounter (Signed)
Received call from patient mother [name] stating she is not able to complete the GES d/t being too weak. They have rescheduled several times. Patient is weak and still losing weight. Not able to intake any solid foods. She is able to drink liquids and yogurt.

## 2019-08-04 NOTE — Telephone Encounter (Signed)
Noted  

## 2019-08-05 ENCOUNTER — Telehealth: Payer: Self-pay

## 2019-08-05 ENCOUNTER — Encounter: Payer: Self-pay | Admitting: Internal Medicine

## 2019-08-05 NOTE — Telephone Encounter (Signed)
-----   Message from Daneil Dolin, MD sent at 08/05/2019 10:46 AM EDT ----- Patient needs an office visit with app in the next couple of weeks.

## 2019-08-05 NOTE — Telephone Encounter (Signed)
Per RMR, pt needs an apt with an extender in a couple weeks. Apt is 08/14/19 @ 8:30. Pts mother wanted pt seen soon.

## 2019-08-06 ENCOUNTER — Other Ambulatory Visit: Payer: Self-pay

## 2019-08-06 ENCOUNTER — Ambulatory Visit (INDEPENDENT_AMBULATORY_CARE_PROVIDER_SITE_OTHER): Payer: Medicare Other | Admitting: Psychiatry

## 2019-08-06 ENCOUNTER — Encounter (HOSPITAL_COMMUNITY): Payer: Self-pay | Admitting: Psychiatry

## 2019-08-06 DIAGNOSIS — F250 Schizoaffective disorder, bipolar type: Secondary | ICD-10-CM

## 2019-08-06 DIAGNOSIS — F419 Anxiety disorder, unspecified: Secondary | ICD-10-CM

## 2019-08-06 MED ORDER — PERPHENAZINE 4 MG PO TABS: 4.0000 mg | ORAL_TABLET | Freq: Three times a day (TID) | ORAL | 0 refills | Status: DC

## 2019-08-06 MED ORDER — SERTRALINE HCL 50 MG PO TABS: 50.0000 mg | ORAL_TABLET | Freq: Every day | ORAL | 0 refills | Status: DC

## 2019-08-06 MED ORDER — MIRTAZAPINE 15 MG PO TABS: 15.0000 mg | ORAL_TABLET | Freq: Every day | ORAL | 0 refills | Status: DC

## 2019-08-06 NOTE — Progress Notes (Signed)
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   Glucose, capillary     Status: Abnormal   Collection Time: 06/18/19 10:05 AM  Result Value Ref Range   Glucose-Capillary 148 (H) 70 - 99 mg/dL    Comment: Glucose reference range applies only to samples taken after fasting for at least 8 hours.  Glucose, capillary     Status: Abnormal   Collection Time: 06/18/19 11:37 AM  Result Value Ref Range   Glucose-Capillary  138 (H) 70 - 99 mg/dL    Comment: Glucose reference range applies only to samples taken after fasting for at least 8 hours.  Respiratory Panel by RT PCR (Flu A&B, Covid) - Nasopharyngeal Swab     Status: None   Collection Time: 07/14/19  2:14 PM   Specimen: Nasopharyngeal Swab  Result Value Ref Range   SARS Coronavirus 2 by RT PCR NEGATIVE NEGATIVE    Comment: (NOTE) SARS-CoV-2 target nucleic acids are NOT DETECTED. The SARS-CoV-2 RNA is generally detectable in upper respiratoy specimens during the acute phase of infection. The lowest concentration of SARS-CoV-2 viral copies this assay can detect is 131 copies/mL. A negative result does not preclude SARS-Cov-2 infection and should not be used as the sole basis for treatment or other patient management decisions. A negative result may occur with  improper specimen collection/handling, submission of specimen other than nasopharyngeal swab, presence of viral mutation(s) within the areas targeted by this assay, and inadequate number of viral copies (<131 copies/mL). A negative result must be combined with clinical observations, patient history, and epidemiological information. The expected result is Negative. Fact Sheet for Patients:  PinkCheek.be Fact Sheet for Healthcare Providers:  GravelBags.it This test is not yet ap proved or cleared by the [place] FDA and  has been authorized for detection and/or diagnosis of SARS-CoV-2 by FDA under an Emergency Use Authorization (EUA). This EUA will remain  in effect (meaning this test can be used) for the duration of the COVID-19 declaration under Section 564(b)(1) of the Act, 21 U.S.C. section 360bbb-3(b)(1), unless the authorization is terminated or revoked  sooner.    Influenza A by PCR NEGATIVE NEGATIVE   Influenza B by PCR NEGATIVE NEGATIVE    Comment: (NOTE) The Xpert Xpress SARS-CoV-2/FLU/RSV assay is intended as an aid in   the diagnosis of influenza from Nasopharyngeal swab specimens and  should not be used as a sole basis for treatment. Nasal washings and  aspirates are unacceptable for Xpert Xpress SARS-CoV-2/FLU/RSV  testing. Fact Sheet for Patients: PinkCheek.be Fact Sheet for Healthcare Providers: GravelBags.it This test is not yet approved or cleared by the [place] FDA and  has been authorized for detection and/or diagnosis of SARS-CoV-2 by  FDA under an Emergency Use Authorization (EUA). This EUA will remain  in effect (meaning this test can be used) for the duration of the  Covid-19 declaration under Section 564(b)(1) of the Act, 21  U.S.C. section 360bbb-3(b)(1), unless the authorization is  terminated or revoked. Performed at [facility], [place] [address], [place], [place] [address]   Comprehensive metabolic panel     Status: Abnormal   Collection Time: 07/14/19  2:14 PM  Result Value Ref Range   Sodium 135 135 - 145 mmol/L   Potassium 4.2 3.5 - 5.1 mmol/L   Chloride 104 98 - 111 mmol/L   CO2 20 (L) 22 - 32 mmol/L   Glucose, Bld 226 (H) 70 - 99 mg/dL    Comment: Glucose reference range applies only to samples taken after fasting for at least 8 hours.   BUN 15 6 - 20 mg/dL   Creatinine, Ser 1.14 (H) 0.44 - 1.00 mg/dL   Calcium 9.6 8.9 - 10.3 mg/dL   Total Protein 6.9 6.5 - 8.1 g/dL   Albumin 4.2 3.5 - 5.0 g/dL   AST 14 (L) 15 - 41 U/L   ALT 13 0 - 44 U/L   Alkaline Phosphatase 51 38 - 126 U/L   Total Bilirubin 0.7 0.3 - 1.2 mg/dL   GFR calc non Af Amer 56 (L) >60 mL/min   GFR calc Af Amer >60 >60 mL/min   Anion gap 11 5 - 15    Comment: Performed at [facility], [place] [address], [place], [place] [address]  CBC with Diff     Status: Abnormal   Collection Time: 07/14/19  2:14 PM  Result Value Ref Range   WBC 7.1 4.0 - 10.5 K/uL   RBC 5.06 3.87 - 5.11 MIL/uL   Hemoglobin 14.5 12.0 -  15.0 g/dL   HCT 45.5 36.0 - 46.0 %   MCV 89.9 80.0 - 100.0 fL   MCH 28.7 26.0 - 34.0 pg   MCHC 31.9 30.0 - 36.0 g/dL   RDW 16.7 (H) 11.5 - 15.5 %   Platelets 247 150 - 400 K/uL   nRBC 0.0 0.0 - 0.2 %   Neutrophils Relative % 63 %   Neutro Abs 4.5 1.7 - 7.7 K/uL   Lymphocytes Relative 29 %   Lymphs Abs 2.1 0.7 - 4.0 K/uL   Monocytes Relative 7 %   Monocytes Absolute 0.5 0.1 - 1.0 K/uL   Eosinophils Relative 1 %   Eosinophils Absolute 0.1 0.0 - 0.5 K/uL   Basophils Relative 0 %   Basophils Absolute 0.0 0.0 - 0.1 K/uL   Immature Granulocytes 0 %   Abs Immature Granulocytes 0.03 0.00 - 0.07 K/uL    Comment: Performed at [facility], [place] [address], [place], [place] [address]  I-Stat beta hCG blood, ED     Status:  None   Collection Time: 07/14/19  2:21 PM  Result Value Ref Range   I-stat hCG, quantitative <5.0 <5 mIU/mL   Comment 3            Comment:   GEST. AGE      CONC.  (mIU/mL)   <=1 WEEK        5 - 50     2 WEEKS       50 - 500     3 WEEKS       100 - 10,000     4 WEEKS     1,000 - 30,000        FEMALE AND NON-PREGNANT FEMALE:     LESS THAN 5 mIU/mL   CBG monitoring, ED     Status: Abnormal   Collection Time: 07/14/19  6:27 PM  Result Value Ref Range   Glucose-Capillary 200 (H) 70 - 99 mg/dL    Comment: Glucose reference range applies only to samples taken after fasting for at least 8 hours.  Ethanol     Status: None   Collection Time: 07/14/19  8:30 PM  Result Value Ref Range   Alcohol, Ethyl (B) <10 <10 mg/dL    Comment: (NOTE) Lowest detectable limit for serum alcohol is 10 mg/dL. For medical purposes only. Performed at [facility], [place] [address], [place],  [address]   CBG monitoring, ED     Status: Abnormal   Collection Time: 07/14/19 10:50 PM  Result Value Ref Range   Glucose-Capillary 128 (H) 70 - 99 mg/dL    Comment: Glucose reference range applies only to samples taken after fasting for at least 8 hours.    Comment 1 Notify RN   CBG monitoring, ED     Status: Abnormal   Collection Time: 07/15/19  7:24 AM  Result Value Ref Range   Glucose-Capillary 150 (H) 70 - 99 mg/dL    Comment: Glucose reference range applies only to samples taken after fasting for at least 8 hours.  CBG monitoring, ED     Status: Abnormal   Collection Time: 07/15/19 11:33 AM  Result Value Ref Range   Glucose-Capillary 114 (H) 70 - 99 mg/dL    Comment: Glucose reference range applies only to samples taken after fasting for at least 8 hours.      Constitutional:  There were no vitals taken for this visit.   Musculoskeletal: Strength & Muscle Tone: within normal limits Gait & Station: patient lying on bed Patient leans: N/A  Psychiatric Specialty Exam: Physical Exam  ROS  There were no vitals taken for this visit.There is no height or weight on file to calculate BMI.  General Appearance: Fairly Groomed and Guarded  Eye Contact:  Fair  Speech:  Slow  Volume:  Decreased  Mood:  Anxious, Dysphoric and tired  Affect:  Constricted  Thought Process:  Descriptions of Associations: Intact  Orientation:  Full (Time, Place, and Person)  Thought Content:  Paranoid Ideation and Rumination  Suicidal Thoughts:  No  Homicidal Thoughts:  No  Memory:  Immediate;   Fair Recent;   Fair Remote;   Fair  Judgement:  Fair  Insight:  Fair  Psychomotor Activity:  Decreased  Concentration:  Concentration: Fair and Attention Span: Fair  Recall:  [employer] of Knowledge:  Fair  Language:  Good  Akathisia:  No  Handed:  Right  AIMS (if indicated):     Assets:  Communication Skills Desire for Improvement Housing  Social Support  ADL's:  Intact  Cognition:  WNL  Sleep:   poor     Assessment and Plan: Alfonso is 54 year old African-American female with history of schizoaffective disorder like to establish care in our office as she is not happy with [facility].  She presented with complaint of anxiety, paranoia, poor sleep  and nervousness.  Currently she is taking Zoloft 50 mg daily along with Trilafon 4 mg 3 times a day and Cogentin at bedtime.  We discussed trying low-dose mirtazapine to help the appetite, sleep and anxiety as titrating up Zoloft may cause worsening of GI symptoms.  She agreed with the plan.  She did remember taking mirtazapine in the past but do not recall any side effects.  We will continue Trilafon 4 mg 3 times a day, Cogentin 1 mg at bedtime and start mirtazapine 50 mg at bedtime.  She is no longer taking trazodone.  We discussed therapy and she agree to see a therapist and we will provide names of the therapist in her area.  I had a long discussion with the patient along with her family member who are present at the time of session.  She agreed with the plan.  I recommend to call us back if she is any question, concern acutely worsening of the symptom.  Follow-up in 3 weeks.  Time spent 55 minutes.  Follow Up Instructions:    I discussed the assessment and treatment plan with the patient. The patient was provided an opportunity to ask questions and all were answered. The patient agreed with the plan and demonstrated an understanding of the instructions.   The patient was advised to call back or seek an in-person evaluation if the symptoms worsen or if the condition fails to improve as anticipated.  I provided 55 minutes of non-face-to-face time during this encounter.   Kathlee Nations, MD

## 2019-08-11 ENCOUNTER — Telehealth: Payer: Self-pay | Admitting: Internal Medicine

## 2019-08-11 ENCOUNTER — Other Ambulatory Visit: Payer: Self-pay | Admitting: *Deleted

## 2019-08-11 DIAGNOSIS — I10 Essential (primary) hypertension: Secondary | ICD-10-CM

## 2019-08-11 MED ORDER — LOSARTAN POTASSIUM 50 MG PO TABS: 50.0000 mg | ORAL_TABLET | Freq: Every day | ORAL | 1 refills | Status: DC

## 2019-08-11 NOTE — Telephone Encounter (Signed)
PT MOTHER IS REQUESTING NURSE TO GIVE HER CALL REGARDING PT MEDICINE [phone]

## 2019-08-11 NOTE — Telephone Encounter (Signed)
Spoke w/ pt and mother, states she has just run out of losartan, ask for refill at reduced dose, prescribed at 3/30-31 [facility]long visit

## 2019-08-12 ENCOUNTER — Telehealth: Payer: Self-pay | Admitting: *Deleted

## 2019-08-12 ENCOUNTER — Telehealth: Payer: Self-pay | Admitting: Internal Medicine

## 2019-08-12 ENCOUNTER — Ambulatory Visit (HOSPITAL_COMMUNITY): Payer: Medicare Other | Admitting: Psychiatry

## 2019-08-12 NOTE — Telephone Encounter (Signed)
Called patient and scheduled appointment.

## 2019-08-12 NOTE — Telephone Encounter (Signed)
I did not see that they prescribed a reduced dose at the 3/30 [facility] visit. I sent out a refill for 50 mg QD. Has she been checking her BP at home? If so what has it been running?  Thanks,  Larkin Ina

## 2019-08-12 NOTE — Telephone Encounter (Signed)
PATIENT SON CALLED AND SAID HE WANTED TO RESCHEDULE THE "4 HOUR TEST" SHE WAS SUPPOSED TO HAVE DONE SEVERAL MONTHS BACK.  I TOLD HIM THAT SHE MAY NEED ANOTHER OFFICE VISIT.  PATIENT WAS SCHEDULED FOR APPOINTMENT COMING UP BUT CANCELLED.

## 2019-08-12 NOTE — Telephone Encounter (Signed)
Pt's mother states she had reduced pt's BP meds  For 1 month, she had stopped all exceptmetoprolol then restarted everything again on 08/11/2019 BP's have been running 90/78 to 115/90. After mother tells nurse this an appt is made for 5/5 at [facility] for med reconcile, labs?, evaluate BP. Mother probably needs education on self adjusting meds without md input.

## 2019-08-12 NOTE — Telephone Encounter (Signed)
Please schedule OV per RMR (see phone note from 08/03/19).

## 2019-08-12 NOTE — Telephone Encounter (Signed)
Noted  

## 2019-08-13 ENCOUNTER — Telehealth (HOSPITAL_COMMUNITY): Payer: Self-pay

## 2019-08-13 MED ORDER — BENZTROPINE MESYLATE 1 MG PO TABS: 1.0000 mg | ORAL_TABLET | Freq: Every day | ORAL | 1 refills | Status: DC

## 2019-08-13 NOTE — Telephone Encounter (Signed)
Done

## 2019-08-13 NOTE — Telephone Encounter (Signed)
Sounds great.  Thank you. 

## 2019-08-13 NOTE — Telephone Encounter (Signed)
Pt called requesting refill of benztropine. Per last note, pt takes 1mg  at bedtime, however unclear who is prescribing this? If appropriate, please send to [facility] in [place].

## 2019-08-14 ENCOUNTER — Ambulatory Visit: Payer: Medicare Other | Admitting: Gastroenterology

## 2019-08-19 ENCOUNTER — Ambulatory Visit (INDEPENDENT_AMBULATORY_CARE_PROVIDER_SITE_OTHER): Payer: Medicare Other | Admitting: Internal Medicine

## 2019-08-19 VITALS — BP 120/79 | HR 84 | Temp 98.3°F | Ht 64.0 in | Wt 157.8 lb

## 2019-08-19 DIAGNOSIS — E119 Type 2 diabetes mellitus without complications: Secondary | ICD-10-CM | POA: Diagnosis not present

## 2019-08-19 DIAGNOSIS — R634 Abnormal weight loss: Secondary | ICD-10-CM

## 2019-08-19 DIAGNOSIS — Z7984 Long term (current) use of oral hypoglycemic drugs: Secondary | ICD-10-CM

## 2019-08-19 DIAGNOSIS — Z794 Long term (current) use of insulin: Secondary | ICD-10-CM

## 2019-08-19 LAB — POCT GLYCOSYLATED HEMOGLOBIN (HGB A1C): Hemoglobin A1C: 7.2 % — AB (ref 4.0–5.6)

## 2019-08-19 LAB — GLUCOSE, CAPILLARY: Glucose-Capillary: 196 mg/dL — ABNORMAL HIGH (ref 70–99)

## 2019-08-19 NOTE — Assessment & Plan Note (Signed)
The paitent's a1c during this visit is 7.1. She is currently taking metformin 1000mg  bid and lantus 10u qhs.   -continue current regimen

## 2019-08-19 NOTE — Progress Notes (Signed)
   CC: Weight loss  HPI:  [name] is a 54 y.o. with diabetes mellitus type 2, copd, pulmonary hypertension, bipolar who presents to be evaluated regarding weight loss. Please see problem based charting for evaluation, assessment, and plan.   Past Medical History:  Diagnosis Date  . Alcohol abuse   . Anemia, iron deficiency   . Arteriosclerotic cardiovascular disease (ASCVD)    Minimal at cath in [facility].stress nuclear study in 8/08 with nl EF; neg stress echo in 2010  . Community acquired pneumonia 01/03/10, 05/2010, 04/2012   2011; with pleural effusion-hosp [facility] acute resp failure; intubated in Jan 2014 (HMPV pneumonia)  . Depression   . Diabetes mellitus, type 2 ([place]) 2000   Onset in 2000; no insulin  . Diarrhea 10/14/2013   Started 10/10/13, improved with Imodium.   . Diastolic dysfunction    grade 2 per echo 2011  . Dysphagia   . Gastroesophageal reflux disease    Schatzki's ring  . History of alcohol abuse 07/22/2007   Qualifier: Diagnosis of  By: Lenn Cal    . Hyperlipidemia   . Hypertension `   during treatment with Geodon  . Hypokalemia 12/25/2013  . Left knee pain 08/25/2014  . Obesity   . Oral candidiasis 05/16/2017  . PTSD (post-traumatic stress disorder)   . Pulmonary hypertension ([place]) 05/02/2012   Patient needs repeat echo in 06/2012   . Schizoaffective disorder    requiring multiple psychiatric admissions  . Viral URI 05/12/2013  . Viral wart on finger 10/08/2016  . Vision changes 08/12/2014   Review of Systems:    Denies nausea, vomiting, cough, dyspnea  Physical Exam:  Vitals:   08/19/19 1016  Weight: 157 lb 12.8 oz (71.6 kg)   Physical Exam  Constitutional: She appears well-developed and well-nourished. No distress.  Cardiovascular: Normal rate and regular rhythm.  No murmur heard. Respiratory: Effort normal and breath sounds normal.  GI: Soft. Bowel sounds are normal. She exhibits no distension.  Musculoskeletal:      General: No edema.  Skin: She is not diaphoretic.  Psychiatric: Her speech is normal. Her affect is labile. She is slowed and withdrawn.    Assessment & Plan:   See Encounters Tab for problem based charting.  Patient discussed with Dr. Heber Americus

## 2019-08-19 NOTE — Assessment & Plan Note (Signed)
The patient presents to discuss regarding weight loss. She is currently 157lb from 171lbs in March 2021. She stated that over the past few months she has not felt like eating anything due to loss of appetite.  She had an admission for lithium toxicity 2/17-2/19 at which time she was taken off of lithium and recommended trialafon monotherapy. According to the patient's son the patient has not been alert, has not been concentrating, and does not seem to understand what people tell her at times.   The patient has since been evaluated by Psychiatrist Dr. Adele Schilder on 08/06/2019 at which time he recommended that the patient take trialafon 4mg  tid, cogentin 1mg  qhs, mirtazapine 50mg  qhs. She will have follow up on 5/13.   Assessnent and plan The patient's weight loss is likely secondary to behavioral change. Apparently now the patient is having 3 meals regularly. I anticipate that her weight will improve. She should continue to follow with Dr. Adele Schilder.

## 2019-08-19 NOTE — Patient Instructions (Signed)
It was a pleasure to see you today [name]. Please make the following changes:  Your a1c is 7 and doing well  Please make sure to follow up with Dr. Adele Schilder (Psychiatrist)  If you have any questions or concerns, please call our clinic at [phone] between 9am-5pm and after hours call [phone] and ask for the internal medicine resident on call. If you feel you are having a medical emergency please call 911.   Thank you, we look forward to help you remain healthy!  Lars Mage, MD Internal Medicine PGY3   COVID-19 Vaccine Information can be found at: ShippingScam.co.uk For questions related to vaccine distribution or appointments, please email [email or web address] or call [phone].

## 2019-08-20 ENCOUNTER — Ambulatory Visit: Payer: Medicare Other | Admitting: Gastroenterology

## 2019-08-20 ENCOUNTER — Encounter: Payer: Self-pay | Admitting: *Deleted

## 2019-08-21 NOTE — Progress Notes (Signed)
Internal Medicine Clinic Attending  Case discussed with Dr. Chundi at the time of the visit.  We reviewed the resident's history and exam and pertinent patient test results.  I agree with the assessment, diagnosis, and plan of care documented in the resident's note. 

## 2019-08-25 ENCOUNTER — Other Ambulatory Visit: Payer: Self-pay

## 2019-08-26 ENCOUNTER — Ambulatory Visit: Payer: Medicare Other | Admitting: Gastroenterology

## 2019-08-26 ENCOUNTER — Other Ambulatory Visit: Payer: Self-pay

## 2019-08-26 ENCOUNTER — Telehealth (HOSPITAL_COMMUNITY): Payer: Medicare Other | Admitting: Psychiatry

## 2019-08-26 MED ORDER — METOPROLOL TARTRATE 100 MG PO TABS: 100.0000 mg | ORAL_TABLET | Freq: Two times a day (BID) | ORAL | 0 refills | Status: DC

## 2019-08-26 MED ORDER — METFORMIN HCL 1000 MG PO TABS: 1000.0000 mg | ORAL_TABLET | Freq: Two times a day (BID) | ORAL | 0 refills | Status: DC

## 2019-08-26 MED ORDER — LOVASTATIN 40 MG PO TABS: 40.0000 mg | ORAL_TABLET | Freq: Every evening | ORAL | 0 refills | Status: DC

## 2019-08-26 NOTE — Telephone Encounter (Signed)
Call from pt's mother asking about refills; she's unsure which meds.  I called [facility] - stated pt needs refills on lovastatin, Metformin, and Metoprolol.

## 2019-08-27 ENCOUNTER — Telehealth: Payer: Self-pay | Admitting: Internal Medicine

## 2019-08-27 ENCOUNTER — Telehealth (HOSPITAL_COMMUNITY): Payer: Medicare Other | Admitting: Psychiatry

## 2019-08-27 NOTE — Telephone Encounter (Signed)
Pt son calling checking on pt home aid nurse [phone]

## 2019-08-28 ENCOUNTER — Ambulatory Visit: Payer: Self-pay | Admitting: *Deleted

## 2019-08-28 DIAGNOSIS — I5032 Chronic diastolic (congestive) heart failure: Secondary | ICD-10-CM

## 2019-08-28 DIAGNOSIS — J449 Chronic obstructive pulmonary disease, unspecified: Secondary | ICD-10-CM

## 2019-08-28 DIAGNOSIS — Z794 Long term (current) use of insulin: Secondary | ICD-10-CM

## 2019-08-28 NOTE — Chronic Care Management (AMB) (Signed)
  Chronic Care Management   Outreach Note  08/28/2019 Name: MERIN DELREAL MRN: [id number] DOB: [birth year]  Referred by: Ina Homes, MD Reason for referral : Chronic Care Management (DM, HTN, HF, COPD)   An unsuccessful telephone outreach was attempted today. The patient was referred to the case management team for assistance with care management and care coordination. Unable to leave message as recording stated voice mail box was full.   Follow Up Plan: The care management team will reach out to the patient again over the next 7 days.   Kelli Churn RN, CCM, [facility] RN Care Manager [phone]

## 2019-08-28 NOTE — Telephone Encounter (Signed)
Will fill out.

## 2019-08-28 NOTE — Telephone Encounter (Signed)
Returned call to son. States he spoke with Provider at [facility] on 08/19/2019 regarding need for PCS based on dx of schizophrenia. Discussed with Provider and PCS form given to complete. Hubbard Hartshorn, BSN, RN-BC

## 2019-09-01 ENCOUNTER — Other Ambulatory Visit: Payer: Self-pay

## 2019-09-01 ENCOUNTER — Telehealth (INDEPENDENT_AMBULATORY_CARE_PROVIDER_SITE_OTHER): Payer: Medicare Other | Admitting: Psychiatry

## 2019-09-01 DIAGNOSIS — F419 Anxiety disorder, unspecified: Secondary | ICD-10-CM

## 2019-09-01 DIAGNOSIS — F250 Schizoaffective disorder, bipolar type: Secondary | ICD-10-CM

## 2019-09-01 MED ORDER — BENZTROPINE MESYLATE 1 MG PO TABS: 1.0000 mg | ORAL_TABLET | Freq: Every day | ORAL | 1 refills | Status: DC

## 2019-09-01 MED ORDER — MIRTAZAPINE 15 MG PO TABS: 15.0000 mg | ORAL_TABLET | Freq: Every day | ORAL | 1 refills | Status: DC

## 2019-09-01 MED ORDER — PERPHENAZINE 4 MG PO TABS: 4.0000 mg | ORAL_TABLET | Freq: Three times a day (TID) | ORAL | 1 refills | Status: DC

## 2019-09-01 MED ORDER — SERTRALINE HCL 50 MG PO TABS: 75.0000 mg | ORAL_TABLET | Freq: Every day | ORAL | 1 refills | Status: DC

## 2019-09-01 NOTE — Progress Notes (Signed)
Virtual Visit via Video Note  I connected with [name] on 09/01/19 at  9:20 AM EDT by a video enabled telemedicine application and verified that I am speaking with the correct person using two identifiers.   I discussed the limitations of evaluation and management by telemedicine and the availability of in person appointments. The patient expressed understanding and agreed to proceed.  History of Present Illness: Patient is evaluated by video session.  She is at home with the family members around.  She was seen first time 4 weeks ago.  She has a history of schizoaffective disorder and anxiety.  She was not happy with the day [place] and like to establish care in our office.  Her son and mother was also present during the session.  We started her on Remeron to help her appetite, sleep and anxiety.  She is feeling better.  Her sleep is improved and her mother noticed she went outside few times at the back porch and spent some time.  However she is still isolated, withdrawn and does not do a lot of activities.  She admitted to still feeling anxious nervous and residual paranoia.  Her appetite is improved.  She has no tremors, shakes or any EPS.  She has chronic fatigue and she had lost significant amount of weight in recent months.  She had a lithium toxicity and had a emergency dialysis as her lithium level was not checked regularly when she was under demark.  She is no longer taking trazodone.  Family reported that she is not agitated or anger.  Patient denies any hallucination or any suicidal thoughts.  She lives with her mother and son.  Patient has multiple health issues including COPD, diabetes and history of cardiac disease and pulmonary hypertension.  She denies drinking or using any illegal substances.  She was seen by her primary care physician and her last hemoglobin A1c is 7.2 which is marginally improved from 7.4.   Past Psychiatric History: H/O schizoaffective d/o with at least 10  inpatient and multiple suicidal attempt.  Most significant was jumping from running car and overdose on chlorine bleach drip and hypertensive medication.  Last inpatient in March 2021 in [place]. H/O seeing at [facility] for many years and did well on Geodon but last inpatient it was switched to perphenazine.  Good Depakote, trazodone, temazepam, mirtazapine, Geodon in the past.  Recent Results (from the past 2160 hour(s))  CBG monitoring, ED     Status: Abnormal   Collection Time: 06/03/19  4:50 PM  Result Value Ref Range   Glucose-Capillary 118 (H) 70 - 99 mg/dL  CBG monitoring, ED     Status: Abnormal   Collection Time: 06/03/19  7:41 PM  Result Value Ref Range   Glucose-Capillary 117 (H) 70 - 99 mg/dL  Lipase, blood     Status: None   Collection Time: 06/03/19  7:57 PM  Result Value Ref Range   Lipase 19 11 - 51 U/L    Comment: Performed at [facility] [address], [place], [place] [address]  Comprehensive metabolic panel     Status: Abnormal   Collection Time: 06/03/19  7:57 PM  Result Value Ref Range   Sodium 133 (L) 135 - 145 mmol/L   Potassium 4.7 3.5 - 5.1 mmol/L   Chloride 103 98 - 111 mmol/L   CO2 19 (L) 22 - 32 mmol/L   Glucose, Bld 120 (H) 70 - 99 mg/dL   BUN 24 (H) 6 -  20 mg/dL   Creatinine, Ser 1.75 (H) 0.44 - 1.00 mg/dL   Calcium 10.3 8.9 - 10.3 mg/dL   Total Protein 6.8 6.5 - 8.1 g/dL   Albumin 4.2 3.5 - 5.0 g/dL   AST 12 (L) 15 - 41 U/L   ALT 10 0 - 44 U/L   Alkaline Phosphatase 81 38 - 126 U/L   Total Bilirubin 0.7 0.3 - 1.2 mg/dL   GFR calc non Af Amer 33 (L) >60 mL/min   GFR calc Af Amer 38 (L) >60 mL/min   Anion gap 11 5 - 15    Comment: Performed at [facility] [address] [place], [place] [address]  CBC     Status: Abnormal   Collection Time: 06/03/19  7:57 PM  Result Value Ref Range   WBC 11.2 (H) 4.0 - 10.5 K/uL   RBC 4.58 3.87 - 5.11 MIL/uL   Hemoglobin 13.0 12.0 - 15.0 g/dL   HCT 41.4 36.0 - 46.0 %   MCV 90.4 80.0 - 100.0 fL    MCH 28.4 26.0 - 34.0 pg   MCHC 31.4 30.0 - 36.0 g/dL   RDW 15.5 11.5 - 15.5 %   Platelets 224 150 - 400 K/uL   nRBC 0.0 0.0 - 0.2 %    Comment: Performed at [facility], [place] [address] [place], [place] [address]  Magnesium     Status: None   Collection Time: 06/03/19  7:57 PM  Result Value Ref Range   Magnesium 1.7 1.7 - 2.4 mg/dL    Comment: Performed at [facility], [place] [address] [place], [place] [address]  Lithium level     Status: Abnormal   Collection Time: 06/03/19  9:04 PM  Result Value Ref Range   Lithium Lvl 2.21 (HH) 0.60 - 1.20 mmol/L    Comment: CRITICAL RESULT CALLED TO, READ BACK BY AND VERIFIED WITH: RN G KOPP @2153  06/03/19 BY S GEZAHEGN Performed at [facility], [place] [address] [place], [place] [address]   Respiratory Panel by RT PCR (Flu A&B, Covid) - Nasopharyngeal Swab     Status: None   Collection Time: 06/03/19 10:33 PM   Specimen: Nasopharyngeal Swab  Result Value Ref Range   SARS Coronavirus 2 by RT PCR NEGATIVE NEGATIVE    Comment: (NOTE) SARS-CoV-2 target nucleic acids are NOT DETECTED. The SARS-CoV-2 RNA is generally detectable in upper respiratoy specimens during the acute phase of infection. The lowest concentration of SARS-CoV-2 viral copies this assay can detect is 131 copies/mL. A negative result does not preclude SARS-Cov-2 infection and should not be used as the sole basis for treatment or other patient management decisions. A negative result may occur with  improper specimen collection/handling, submission of specimen other than nasopharyngeal swab, presence of viral mutation(s) within the areas targeted by this assay, and inadequate number of viral copies (<131 copies/mL). A negative result must be combined with clinical observations, patient history, and epidemiological information. The expected result is Negative. Fact Sheet for Patients:  PinkCheek.be Fact Sheet for Healthcare  Providers:  GravelBags.it This test is not yet ap proved or cleared by the [place] FDA and  has been authorized for detection and/or diagnosis of SARS-CoV-2 by FDA under an Emergency Use Authorization (EUA). This EUA will remain  in effect (meaning this test can be used) for the duration of the COVID-19 declaration under Section 564(b)(1) of the Act, 21 U.S.C. section 360bbb-3(b)(1), unless the authorization is terminated  or revoked sooner.    Influenza A by PCR NEGATIVE NEGATIVE   Influenza B by PCR NEGATIVE NEGATIVE    Comment: (NOTE) The Xpert Xpress SARS-CoV-2/FLU/RSV assay is intended as an aid in  the diagnosis of influenza from Nasopharyngeal swab specimens and  should not be used as a sole basis for treatment. Nasal washings and  aspirates are unacceptable for Xpert Xpress SARS-CoV-2/FLU/RSV  testing. Fact Sheet for Patients: PinkCheek.be Fact Sheet for Healthcare Providers: GravelBags.it This test is not yet approved or cleared by the [place] FDA and  has been authorized for detection and/or diagnosis of SARS-CoV-2 by  FDA under an Emergency Use Authorization (EUA). This EUA will remain  in effect (meaning this test can be used) for the duration of the  Covid-19 declaration under Section 564(b)(1) of the Act, 21  U.S.C. section 360bbb-3(b)(1), unless the authorization is  terminated or revoked. Performed at [facility], [place] [address], [place], [place] [address]   APTT     Status: Abnormal   Collection Time: 06/04/19  1:15 AM  Result Value Ref Range   aPTT 21 (L) 24 - 36 seconds    Comment: Performed at [facility] [address], [place], [place] [address]  Protime-INR     Status: None   Collection Time: 06/04/19  1:15 AM  Result Value Ref Range   Prothrombin Time 14.4 11.4 - 15.2 seconds   INR 1.1 0.8 - 1.2    Comment: (NOTE) INR goal varies based on  device and disease states. Performed at [facility], [place] [address] [place], [place] [address]   CK     Status: Abnormal   Collection Time: 06/04/19  1:15 AM  Result Value Ref Range   Total CK 25 (L) 38 - 234 U/L    Comment: Performed at [facility], [place] [address] [place], [place] [address]  Hepatitis B surface antigen     Status: None   Collection Time: 06/04/19  1:44 AM  Result Value Ref Range   Hepatitis B Surface Ag NON REACTIVE NON REACTIVE    Comment: Performed at [facility] [address], [place], [place] [address]  Hepatitis B core antibody, total     Status: None   Collection Time: 06/04/19  1:44 AM  Result Value Ref Range   Hep B Core Total Ab NON REACTIVE NON REACTIVE    Comment: Performed at [facility] [address], [place], [place] [address]  Hepatitis B surface antibody,qualitative     Status: None   Collection Time: 06/04/19  1:44 AM  Result Value Ref Range   Hep B S Ab NON REACTIVE NON REACTIVE    Comment: (NOTE) Inconsistent with immunity, less than 10 mIU/mL. Performed at [facility], [place] [address], [place], [place] [address]   Lithium level     Status: None   Collection Time: 06/04/19  6:44 AM  Result Value Ref Range   Lithium Lvl 0.92 0.60 - 1.20 mmol/L    Comment: Performed at [facility] [address], [place], [place] [address]  HIV Antibody (routine testing w rflx)     Status: None   Collection Time: 06/04/19  6:44 AM  Result Value Ref Range   HIV Screen 4th Generation wRfx NON REACTIVE NON REACTIVE    Comment: Performed at [facility] [address] [place] [address], [place], [place] [address]  Hemoglobin A1c  Status: Abnormal   Collection Time: 06/04/19  6:44 AM  Result Value Ref Range   Hgb A1c MFr Bld 7.4 (H) 4.8 - 5.6 %    Comment: (NOTE) Pre diabetes:          5.7%-6.4% Diabetes:              >6.4% Glycemic control for   <7.0% adults with diabetes    Mean Plasma Glucose 165.68 mg/dL     Comment: Performed at [facility] [address] [place], [place] [address]  CBC     Status: Abnormal   Collection Time: 06/04/19  6:44 AM  Result Value Ref Range   WBC 11.9 (H) 4.0 - 10.5 K/uL   RBC 4.13 3.87 - 5.11 MIL/uL   Hemoglobin 11.7 (L) 12.0 - 15.0 g/dL   HCT 36.7 36.0 - 46.0 %   MCV 88.9 80.0 - 100.0 fL   MCH 28.3 26.0 - 34.0 pg   MCHC 31.9 30.0 - 36.0 g/dL   RDW 15.4 11.5 - 15.5 %   Platelets 155 150 - 400 K/uL   nRBC 0.0 0.0 - 0.2 %    Comment: Performed at [facility], [place] [address] [place], [place] [address]  Comprehensive metabolic panel     Status: Abnormal   Collection Time: 06/04/19  6:44 AM  Result Value Ref Range   Sodium 136 135 - 145 mmol/L   Potassium 3.4 (L) 3.5 - 5.1 mmol/L   Chloride 102 98 - 111 mmol/L   CO2 22 22 - 32 mmol/L   Glucose, Bld 174 (H) 70 - 99 mg/dL   BUN 8 6 - 20 mg/dL   Creatinine, Ser 1.20 (H) 0.44 - 1.00 mg/dL   Calcium 8.3 (L) 8.9 - 10.3 mg/dL   Total Protein 6.0 (L) 6.5 - 8.1 g/dL   Albumin 3.6 3.5 - 5.0 g/dL   AST 12 (L) 15 - 41 U/L   ALT 11 0 - 44 U/L   Alkaline Phosphatase 86 38 - 126 U/L   Total Bilirubin 1.6 (H) 0.3 - 1.2 mg/dL   GFR calc non Af Amer 52 (L) >60 mL/min   GFR calc Af Amer >60 >60 mL/min   Anion gap 12 5 - 15    Comment: Performed at [facility], [place] [address] [place], [place] [address]  Protime-INR     Status: None   Collection Time: 06/04/19  6:44 AM  Result Value Ref Range   Prothrombin Time 13.5 11.4 - 15.2 seconds   INR 1.0 0.8 - 1.2    Comment: (NOTE) INR goal varies based on device and disease states. Performed at [facility], [place] [address] [place], [place] [address]   Renal function panel     Status: Abnormal   Collection Time: 06/04/19  6:44 AM  Result Value Ref Range   Sodium 137 135 - 145 mmol/L   Potassium 3.4 (L) 3.5 - 5.1 mmol/L   Chloride 103 98 - 111 mmol/L   CO2 22 22 - 32 mmol/L   Glucose, Bld 172 (H) 70 - 99 mg/dL   BUN 8 6 - 20 mg/dL    Creatinine, Ser 1.14 (H) 0.44 - 1.00 mg/dL   Calcium 8.4 (L) 8.9 - 10.3 mg/dL   Phosphorus 2.3 (L) 2.5 - 4.6 mg/dL   Albumin 3.7 3.5 - 5.0 g/dL   GFR calc non Af Amer 56 (L) >60 mL/min   GFR calc Af Amer >60 >  60 mL/min   Anion gap 12 5 - 15    Comment: Performed at [facility] [address], [place], [place] [address]  Glucose, capillary     Status: Abnormal   Collection Time: 06/04/19  8:29 AM  Result Value Ref Range   Glucose-Capillary 160 (H) 70 - 99 mg/dL  Glucose, capillary     Status: Abnormal   Collection Time: 06/04/19 12:46 PM  Result Value Ref Range   Glucose-Capillary 144 (H) 70 - 99 mg/dL  Sodium, urine, random     Status: None   Collection Time: 06/04/19  3:00 PM  Result Value Ref Range   Sodium, Ur 106 mmol/L    Comment: Performed at [facility] [address], [place], [place] [address]  Creatinine, urine, random     Status: None   Collection Time: 06/04/19  3:00 PM  Result Value Ref Range   Creatinine, Urine 246.29 mg/dL    Comment: Performed at [facility] [address], [place], [place] [address]  Urinalysis, Complete w Microscopic     Status: Abnormal   Collection Time: 06/04/19  3:00 PM  Result Value Ref Range   Color, Urine YELLOW YELLOW   APPearance HAZY (A) CLEAR   Specific Gravity, Urine 1.021 1.005 - 1.030   pH 5.0 5.0 - 8.0   Glucose, UA NEGATIVE NEGATIVE mg/dL   Hgb urine dipstick NEGATIVE NEGATIVE   Bilirubin Urine NEGATIVE NEGATIVE   Ketones, ur 20 (A) NEGATIVE mg/dL   Protein, ur NEGATIVE NEGATIVE mg/dL   Nitrite NEGATIVE NEGATIVE   Leukocytes,Ua NEGATIVE NEGATIVE   WBC, UA 0-5 0 - 5 WBC/hpf   Bacteria, UA RARE (A) NONE SEEN   Squamous Epithelial / LPF 6-10 0 - 5   Mucus PRESENT    Hyaline Casts, UA PRESENT     Comment: Performed at [facility] [address] [place], [place] [address]  Glucose, capillary     Status: Abnormal   Collection Time: 06/04/19  4:20 PM  Result Value Ref Range    Glucose-Capillary 189 (H) 70 - 99 mg/dL  Lithium level     Status: None   Collection Time: 06/04/19  4:55 PM  Result Value Ref Range   Lithium Lvl 1.06 0.60 - 1.20 mmol/L    Comment: Performed at [facility] [address], [place], [place] [address]  Glucose, capillary     Status: Abnormal   Collection Time: 06/04/19  9:01 PM  Result Value Ref Range   Glucose-Capillary 143 (H) 70 - 99 mg/dL  Renal function panel     Status: Abnormal   Collection Time: 06/05/19  3:13 AM  Result Value Ref Range   Sodium 136 135 - 145 mmol/L   Potassium 4.0 3.5 - 5.1 mmol/L   Chloride 104 98 - 111 mmol/L   CO2 22 22 - 32 mmol/L   Glucose, Bld 159 (H) 70 - 99 mg/dL   BUN 6 6 - 20 mg/dL   Creatinine, Ser 1.13 (H) 0.44 - 1.00 mg/dL   Calcium 8.3 (L) 8.9 - 10.3 mg/dL   Phosphorus 1.8 (L) 2.5 - 4.6 mg/dL   Albumin 3.5 3.5 - 5.0 g/dL   GFR calc non Af Amer 56 (L) >60 mL/min   GFR calc Af Amer >60 >60 mL/min   Anion gap 10 5 - 15    Comment: Performed at [facility] [address], [place], [place] [address]  Lithium  level     Status: None   Collection Time: 06/05/19  3:13 AM  Result Value Ref Range   Lithium Lvl 0.97 0.60 - 1.20 mmol/L    Comment: Performed at [facility] [address], [place], [place] [address]  CBC     Status: Abnormal   Collection Time: 06/05/19  3:13 AM  Result Value Ref Range   WBC 8.5 4.0 - 10.5 K/uL   RBC 4.23 3.87 - 5.11 MIL/uL   Hemoglobin 11.8 (L) 12.0 - 15.0 g/dL   HCT 37.9 36.0 - 46.0 %   MCV 89.6 80.0 - 100.0 fL   MCH 27.9 26.0 - 34.0 pg   MCHC 31.1 30.0 - 36.0 g/dL   RDW 15.4 11.5 - 15.5 %   Platelets 155 150 - 400 K/uL   nRBC 0.0 0.0 - 0.2 %    Comment: Performed at [facility], [place] [address], [place], [place] [address]  Magnesium     Status: Abnormal   Collection Time: 06/05/19  3:13 AM  Result Value Ref Range   Magnesium 1.3 (L) 1.7 - 2.4 mg/dL    Comment: Performed at [facility] [address] [place], [place]  [address]  Lipid panel     Status: Abnormal   Collection Time: 06/05/19  3:13 AM  Result Value Ref Range   Cholesterol 86 0 - 200 mg/dL   Triglycerides 118 <150 mg/dL   HDL 28 (L) >40 mg/dL   Total CHOL/HDL Ratio 3.1 RATIO   VLDL 24 0 - 40 mg/dL   LDL Cholesterol 34 0 - 99 mg/dL    Comment:        Total Cholesterol/HDL:CHD Risk Coronary Heart Disease Risk Table                     Men   Women  1/2 Average Risk   3.4   3.3  Average Risk       5.0   4.4  2 X Average Risk   9.6   7.1  3 X Average Risk  23.4   11.0        Use the calculated Patient Ratio above and the CHD Risk Table to determine the patient's CHD Risk.        ATP III CLASSIFICATION (LDL):  <100     mg/dL   Optimal  100-129  mg/dL   Near or Above                    Optimal  130-159  mg/dL   Borderline  160-189  mg/dL   High  >190     mg/dL   Very High Performed at [facility] [address], [place], [place] [address]   Glucose, capillary     Status: Abnormal   Collection Time: 06/05/19  7:38 AM  Result Value Ref Range   Glucose-Capillary 163 (H) 70 - 99 mg/dL  Glucose, capillary     Status: Abnormal   Collection Time: 06/05/19 11:30 AM  Result Value Ref Range   Glucose-Capillary 134 (H) 70 - 99 mg/dL  Glucose, capillary     Status: Abnormal   Collection Time: 06/05/19  4:15 PM  Result Value Ref Range   Glucose-Capillary 119 (H) 70 - 99 mg/dL  CBC no Diff     Status: None   Collection Time: 06/11/19  1:54 PM  Result Value Ref Range   WBC 8.7 3.4 -  10.8 x10E3/uL   RBC 4.36 3.77 - 5.28 x10E6/uL   Hemoglobin 12.5 11.1 - 15.9 g/dL   Hematocrit 38.7 34.0 - 46.6 %   MCV 89 79 - 97 fL   MCH 28.7 26.6 - 33.0 pg   MCHC 32.3 31.5 - 35.7 g/dL   RDW 15.0 11.7 - 15.4 %   Platelets 325 150 - 450 x10E3/uL  BMP8+Anion Gap     Status: Abnormal   Collection Time: 06/11/19  1:54 PM  Result Value Ref Range   Glucose 83 65 - 99 mg/dL   BUN 10 6 - 24 mg/dL   Creatinine, Ser 1.12 (H) 0.57 - 1.00 mg/dL   GFR calc  non Af Amer 57 (L) >59 mL/min/1.73   GFR calc Af Amer 66 >59 mL/min/1.73   BUN/Creatinine Ratio 9 9 - 23   Sodium 135 134 - 144 mmol/L   Potassium 4.7 3.5 - 5.2 mmol/L   Chloride 103 96 - 106 mmol/L   CO2 18 (L) 20 - 29 mmol/L   Anion Gap 14.0 10.0 - 18.0 mmol/L   Calcium 10.2 8.7 - 10.2 mg/dL  Magnesium     Status: Abnormal   Collection Time: 06/11/19  1:54 PM  Result Value Ref Range   Magnesium 1.4 (L) 1.6 - 2.3 mg/dL  Lithium     Status: Abnormal   Collection Time: 06/11/19  1:54 PM  Result Value Ref Range   Lithium Lvl 0.2 (L) 0.6 - 1.2 mmol/L    Comment:                                  Detection Limit = 0.1                           <0.1 indicates None Detected   SARS CORONAVIRUS 2 (TAT 6-24 HRS) Nasopharyngeal Nasopharyngeal Swab     Status: None   Collection Time: 06/16/19  6:29 AM   Specimen: Nasopharyngeal Swab  Result Value Ref Range   SARS Coronavirus 2 NEGATIVE NEGATIVE    Comment: (NOTE) SARS-CoV-2 target nucleic acids are NOT DETECTED. The SARS-CoV-2 RNA is generally detectable in upper and lower respiratory specimens during the acute phase of infection. Negative results do not preclude SARS-CoV-2 infection, do not rule out co-infections with other pathogens, and should not be used as the sole basis for treatment or other patient management decisions. Negative results must be combined with clinical observations, patient history, and epidemiological information. The expected result is Negative. Fact Sheet for Patients: SugarRoll.be Fact Sheet for Healthcare Providers: https://www.woods-mathews.com/ This test is not yet approved or cleared by the [place] FDA and  has been authorized for detection and/or diagnosis of SARS-CoV-2 by FDA under an Emergency Use Authorization (EUA). This EUA will remain  in effect (meaning this test can be used) for the duration of the COVID-19 declaration under Section 56 4(b)(1) of the  Act, 21 U.S.C. section 360bbb-3(b)(1), unless the authorization is terminated or revoked sooner. Performed at [facility], [place] [address], [place], [place] [address]   Glucose, capillary     Status: Abnormal   Collection Time: 06/18/19 10:05 AM  Result Value Ref Range   Glucose-Capillary 148 (H) 70 - 99 mg/dL    Comment: Glucose reference range applies only to samples taken after fasting for at least 8 hours.  Glucose, capillary     Status:  Abnormal   Collection Time: 06/18/19 11:37 AM  Result Value Ref Range   Glucose-Capillary 138 (H) 70 - 99 mg/dL    Comment: Glucose reference range applies only to samples taken after fasting for at least 8 hours.  Respiratory Panel by RT PCR (Flu A&B, Covid) - Nasopharyngeal Swab     Status: None   Collection Time: 07/14/19  2:14 PM   Specimen: Nasopharyngeal Swab  Result Value Ref Range   SARS Coronavirus 2 by RT PCR NEGATIVE NEGATIVE    Comment: (NOTE) SARS-CoV-2 target nucleic acids are NOT DETECTED. The SARS-CoV-2 RNA is generally detectable in upper respiratoy specimens during the acute phase of infection. The lowest concentration of SARS-CoV-2 viral copies this assay can detect is 131 copies/mL. A negative result does not preclude SARS-Cov-2 infection and should not be used as the sole basis for treatment or other patient management decisions. A negative result may occur with  improper specimen collection/handling, submission of specimen other than nasopharyngeal swab, presence of viral mutation(s) within the areas targeted by this assay, and inadequate number of viral copies (<131 copies/mL). A negative result must be combined with clinical observations, patient history, and epidemiological information. The expected result is Negative. Fact Sheet for Patients:  PinkCheek.be Fact Sheet for Healthcare Providers:  GravelBags.it This test is not yet ap proved or cleared by  the [place] FDA and  has been authorized for detection and/or diagnosis of SARS-CoV-2 by FDA under an Emergency Use Authorization (EUA). This EUA will remain  in effect (meaning this test can be used) for the duration of the COVID-19 declaration under Section 564(b)(1) of the Act, 21 U.S.C. section 360bbb-3(b)(1), unless the authorization is terminated or revoked sooner.    Influenza A by PCR NEGATIVE NEGATIVE   Influenza B by PCR NEGATIVE NEGATIVE    Comment: (NOTE) The Xpert Xpress SARS-CoV-2/FLU/RSV assay is intended as an aid in  the diagnosis of influenza from Nasopharyngeal swab specimens and  should not be used as a sole basis for treatment. Nasal washings and  aspirates are unacceptable for Xpert Xpress SARS-CoV-2/FLU/RSV  testing. Fact Sheet for Patients: PinkCheek.be Fact Sheet for Healthcare Providers: GravelBags.it This test is not yet approved or cleared by the [place] FDA and  has been authorized for detection and/or diagnosis of SARS-CoV-2 by  FDA under an Emergency Use Authorization (EUA). This EUA will remain  in effect (meaning this test can be used) for the duration of the  Covid-19 declaration under Section 564(b)(1) of the Act, 21  U.S.C. section 360bbb-3(b)(1), unless the authorization is  terminated or revoked. Performed at [facility], [place] [address], [place], [place] [address]   Comprehensive metabolic panel     Status: Abnormal   Collection Time: 07/14/19  2:14 PM  Result Value Ref Range   Sodium 135 135 - 145 mmol/L   Potassium 4.2 3.5 - 5.1 mmol/L   Chloride 104 98 - 111 mmol/L   CO2 20 (L) 22 - 32 mmol/L   Glucose, Bld 226 (H) 70 - 99 mg/dL    Comment: Glucose reference range applies only to samples taken after fasting for at least 8 hours.   BUN 15 6 - 20 mg/dL   Creatinine, Ser 1.14 (H) 0.44 - 1.00 mg/dL   Calcium 9.6 8.9 - 10.3 mg/dL   Total Protein 6.9  6.5 - 8.1 g/dL   Albumin 4.2 3.5 - 5.0 g/dL   AST 14 (L) 15 - 41 U/L   ALT 13  0 - 44 U/L   Alkaline Phosphatase 51 38 - 126 U/L   Total Bilirubin 0.7 0.3 - 1.2 mg/dL   GFR calc non Af Amer 56 (L) >60 mL/min   GFR calc Af Amer >60 >60 mL/min   Anion gap 11 5 - 15    Comment: Performed at [facility], [place] [address], [place], [place] [address]  CBC with Diff     Status: Abnormal   Collection Time: 07/14/19  2:14 PM  Result Value Ref Range   WBC 7.1 4.0 - 10.5 K/uL   RBC 5.06 3.87 - 5.11 MIL/uL   Hemoglobin 14.5 12.0 - 15.0 g/dL   HCT 45.5 36.0 - 46.0 %   MCV 89.9 80.0 - 100.0 fL   MCH 28.7 26.0 - 34.0 pg   MCHC 31.9 30.0 - 36.0 g/dL   RDW 16.7 (H) 11.5 - 15.5 %   Platelets 247 150 - 400 K/uL   nRBC 0.0 0.0 - 0.2 %   Neutrophils Relative % 63 %   Neutro Abs 4.5 1.7 - 7.7 K/uL   Lymphocytes Relative 29 %   Lymphs Abs 2.1 0.7 - 4.0 K/uL   Monocytes Relative 7 %   Monocytes Absolute 0.5 0.1 - 1.0 K/uL   Eosinophils Relative 1 %   Eosinophils Absolute 0.1 0.0 - 0.5 K/uL   Basophils Relative 0 %   Basophils Absolute 0.0 0.0 - 0.1 K/uL   Immature Granulocytes 0 %   Abs Immature Granulocytes 0.03 0.00 - 0.07 K/uL    Comment: Performed at [facility], [place] [address] [place], [place] [address]  I-Stat beta hCG blood, ED     Status: None   Collection Time: 07/14/19  2:21 PM  Result Value Ref Range   I-stat hCG, quantitative <5.0 <5 mIU/mL   Comment 3            Comment:   GEST. AGE      CONC.  (mIU/mL)   <=1 WEEK        5 - 50     2 WEEKS       50 - 500     3 WEEKS       100 - 10,000     4 WEEKS     1,000 - 30,000        FEMALE AND NON-PREGNANT FEMALE:     LESS THAN 5 mIU/mL   CBG monitoring, ED     Status: Abnormal   Collection Time: 07/14/19  6:27 PM  Result Value Ref Range   Glucose-Capillary 200 (H) 70 - 99 mg/dL    Comment: Glucose reference range applies only to samples taken after fasting for at least 8 hours.  Ethanol      Status: None   Collection Time: 07/14/19  8:30 PM  Result Value Ref Range   Alcohol, Ethyl (B) <10 <10 mg/dL    Comment: (NOTE) Lowest detectable limit for serum alcohol is 10 mg/dL. For medical purposes only. Performed at [facility], [place] [address] [place], [place] [address]   CBG monitoring, ED     Status: Abnormal   Collection Time: 07/14/19 10:50 PM  Result Value Ref Range   Glucose-Capillary 128 (H) 70 - 99 mg/dL    Comment: Glucose reference range applies only to samples taken after fasting for at least 8 hours.   Comment 1 Notify RN   CBG monitoring, ED     Status: Abnormal  Collection Time: 07/15/19  7:24 AM  Result Value Ref Range   Glucose-Capillary 150 (H) 70 - 99 mg/dL    Comment: Glucose reference range applies only to samples taken after fasting for at least 8 hours.  CBG monitoring, ED     Status: Abnormal   Collection Time: 07/15/19 11:33 AM  Result Value Ref Range   Glucose-Capillary 114 (H) 70 - 99 mg/dL    Comment: Glucose reference range applies only to samples taken after fasting for at least 8 hours.  Glucose, capillary     Status: Abnormal   Collection Time: 08/19/19 10:43 AM  Result Value Ref Range   Glucose-Capillary 196 (H) 70 - 99 mg/dL    Comment: Glucose reference range applies only to samples taken after fasting for at least 8 hours.  POC Hbg A1C     Status: Abnormal   Collection Time: 08/19/19 10:54 AM  Result Value Ref Range   Hemoglobin A1C 7.2 (A) 4.0 - 5.6 %   HbA1c POC (<> result, manual entry)     HbA1c, POC (prediabetic range)     HbA1c, POC (controlled diabetic range)       Psychiatric Specialty Exam: Physical Exam  Review of Systems  There were no vitals taken for this visit.There is no height or weight on file to calculate BMI.  General Appearance: NA  Eye Contact:  NA  Speech:  Slow  Volume:  Decreased  Mood:  Depressed and Dysphoric  Affect:  NA  Thought Process:  Descriptions of Associations: Intact   Orientation:  Full (Time, Place, and Person)  Thought Content:  Paranoid Ideation  Suicidal Thoughts:  No  Homicidal Thoughts:  No  Memory:  Immediate;   Fair Recent;   Fair Remote;   Fair  Judgement:  Intact  Insight:  Fair  Psychomotor Activity:  Decreased  Concentration:  Concentration: Fair and Attention Span: Fair  Recall:  [employer] of Knowledge:  Fair  Language:  Fair  Akathisia:  No  Handed:  Right  AIMS (if indicated):     Assets:  Communication Skills Desire for Improvement Housing Social Support  ADL's:  Intact  Cognition:  WNL  Sleep:   improved      Assessment and Plan: Schizoaffective disorder, bipolar type.  Anxiety.  Patient is still struggling with social isolation, anxiety and chronic depression.  Recommended to try Zoloft 75 mg to target residual symptoms.  Continue Remeron 15 mg at bedtime with is helping her appetite sleep.  Continue Trilafon 4 mg 3 times a day and Cogentin 1 mg at bedtime.  She has not able to find a therapist which I do believe needed to help her coping skills.  We will provide names of the therapist in her area.  Discussed medication side effects and benefits.  I had a discussion with the patient along with her family member and give ample amount of time to have any question.  Recommended to call us back if she has any question or any concern about the medication.  Discussed safety concerns at any time having active suicidal thoughts or homicidal thoughts continue to call 911 or go to local emergency room.  Patient likes to be follow-up every month until she find a therapist.  Follow-up in 4 weeks.  Time spent 25 minutes.  Follow Up Instructions:    I discussed the assessment and treatment plan with the patient. The patient was provided an opportunity to ask questions and all were answered. The patient  agreed with the plan and demonstrated an understanding of the instructions.   The patient was advised to call back or seek an in-person  evaluation if the symptoms worsen or if the condition fails to improve as anticipated.  I provided 25 minutes of non-face-to-face time during this encounter.   Kathlee Nations, MD

## 2019-09-03 NOTE — Telephone Encounter (Signed)
Completed and signed Request for Independent Assessment for [place] [facility] of Medical Need faxed to [facility] at [phone].  Morley Gaumer L Ducatte5/20/20214:03 PM

## 2019-09-07 ENCOUNTER — Ambulatory Visit: Payer: Self-pay | Admitting: *Deleted

## 2019-09-07 NOTE — Chronic Care Management (AMB) (Signed)
  Care Management   Outreach Note  09/07/2019 Name: [name] MRN: [id number] DOB: [birth year]  Referred by: Ina Homes, MD Reason for referral : Chronic Care Management (Dm, HF, HTN, COPD)   A second unsuccessful telephone outreach was attempted today. The patient was referred to the case management team for assistance with care management and care coordination. Patient's  son answered phone and suggested patient be contacted at [phone].   Follow Up Plan: Telephone follow up appointment with care management team member scheduled for:09/11/19  Kelli Churn RN, CCM, [facility] RN Care Manager [phone]

## 2019-09-08 ENCOUNTER — Other Ambulatory Visit: Payer: Self-pay | Admitting: *Deleted

## 2019-09-08 DIAGNOSIS — E119 Type 2 diabetes mellitus without complications: Secondary | ICD-10-CM

## 2019-09-08 NOTE — Telephone Encounter (Signed)
Also requesting refill on Accu-chek fast clik lancets Use to check BS 3 times daily qty# 306.

## 2019-09-09 MED ORDER — ACCU-CHEK SOFTCLIX LANCETS MISC: 12 refills | Status: DC

## 2019-09-09 MED ORDER — SPIRONOLACTONE 50 MG PO TABS: 50.0000 mg | ORAL_TABLET | Freq: Every day | ORAL | 3 refills | Status: DC

## 2019-09-09 NOTE — Addendum Note (Signed)
Addended by: Ina Homes T on: 09/09/2019 01:37 PM   Modules accepted: Orders

## 2019-09-09 NOTE — Telephone Encounter (Signed)
Also requesting accu-chek lancets if appropriate. Thanks

## 2019-09-11 ENCOUNTER — Ambulatory Visit: Payer: Medicare Other | Admitting: *Deleted

## 2019-09-11 DIAGNOSIS — J449 Chronic obstructive pulmonary disease, unspecified: Secondary | ICD-10-CM

## 2019-09-11 DIAGNOSIS — I10 Essential (primary) hypertension: Secondary | ICD-10-CM

## 2019-09-11 DIAGNOSIS — E119 Type 2 diabetes mellitus without complications: Secondary | ICD-10-CM

## 2019-09-11 NOTE — Chronic Care Management (AMB) (Signed)
Chronic Care Management   Initial Visit Note  09/11/2019 Name: [name] MRN: [id number] DOB: 1966/01/16  Referred by: Ina Homes, MD Reason for referral : Chronic Care Management (DM, HTN, COPD)   [name] is a 54 y.o. year old female who is a primary care patient of Helberg, Larkin Ina, MD. The CCM team was consulted for assistance with chronic disease management and care coordination needs related to HTN, HLD, COPD, DMII, Bipolar Disorder, and Schizoaffective Disorder  Review of patient status, including review of consultants reports, relevant laboratory and other test results, and collaboration with appropriate care team members and the patient's provider was performed as part of comprehensive patient evaluation and provision of chronic care management services.    SDOH (Social Determinants of Health) assessments performed: Yes See Care Plan activities for detailed interventions related to SDOH  SDOH Interventions     Most Recent Value  SDOH Interventions  Housing Interventions  Intervention Not Indicated  Transportation Interventions  Intervention Not Indicated       Medications: Outpatient Encounter Medications as of 09/11/2019  Medication Sig Note  . Accu-Chek Softclix Lancets lancets Use as instructed   . albuterol (VENTOLIN HFA) 108 (90 Base) MCG/ACT inhaler Inhale 1-2 puffs into the lungs every 6 (six) hours as needed for wheezing or shortness of breath. 09/11/2019: Uses rarely   . amLODipine (NORVASC) 10 MG tablet Take 1 tablet (10 mg total) by mouth daily. For high blood pressure (Patient taking differently: Take 10 mg by mouth daily. )   . benztropine (COGENTIN) 1 MG tablet Take 1 tablet (1 mg total) by mouth at bedtime.   . ferrous sulfate 325 (65 FE) MG tablet Take 1 tablet (325 mg total) by mouth every other day. (May buy from over the counter): For anemia (Patient taking differently: Take 325 mg by mouth every other day. )   . insulin glargine (LANTUS)  100 UNIT/ML injection Inject 0.1 mLs (10 Units total) into the skin daily. (Patient taking differently: Inject 10 Units into the skin daily. ) 09/11/2019: Injects 10 units  . losartan (COZAAR) 50 MG tablet Take 1 tablet (50 mg total) by mouth daily.   Marland Kitchen lovastatin (MEVACOR) 40 MG tablet Take 1 tablet (40 mg total) by mouth every evening.   . metFORMIN (GLUCOPHAGE) 1000 MG tablet Take 1 tablet (1,000 mg total) by mouth 2 (two) times daily with a meal.   . metoprolol tartrate (LOPRESSOR) 100 MG tablet Take 1 tablet (100 mg total) by mouth 2 (two) times daily.   . mirtazapine (REMERON) 15 MG tablet Take 1 tablet (15 mg total) by mouth at bedtime.   Marland Kitchen perphenazine (TRILAFON) 4 MG tablet Take 1 tablet (4 mg total) by mouth 3 (three) times daily.   Marland Kitchen senna-docusate (SENOKOT-S) 8.6-50 MG tablet Take 1 tablet by mouth at bedtime as needed for mild constipation.   . sertraline (ZOLOFT) 50 MG tablet Take 1.5 tablets (75 mg total) by mouth daily.   Marland Kitchen spironolactone (ALDACTONE) 50 MG tablet Take 1 tablet (50 mg total) by mouth daily.   . Thiamine HCl (VITAMIN B-1) 100 MG TABS Take 1 tablet (100 mg total) by mouth daily.   . nicotine (NICODERM CQ - DOSED IN MG/24 HR) 7 mg/24hr patch Place 1 patch (7 mg total) onto the skin daily.   . ondansetron (ZOFRAN) 4 MG tablet TAKE 1 TABLET(4 MG) BY MOUTH EVERY 4 HOURS AS NEEDED FOR NAUSEA OR VOMITING   . pantoprazole (PROTONIX) 40 MG tablet Take  40 mg by mouth 2 (two) times daily.   . traZODone (DESYREL) 50 MG tablet Take 50 mg to 100 mg (1to 2 tablets) Q hs prn for sleep (Patient not taking: Reported on 08/06/2019)    No facility-administered encounter medications on file as of 09/11/2019.     Objective:  Lab Results  Component Value Date   HGBA1C 7.2 (A) 08/19/2019   HGBA1C 7.4 (H) 06/04/2019   HGBA1C 7.3 (A) 02/19/2019   Lab Results  Component Value Date   MICROALBUR 0.50 09/30/2013   LDLCALC 34 06/05/2019   CREATININE 1.14 (H) 07/14/2019   Lab Results    Component Value Date   LABMICR Comment 02/19/2019   LABMICR Comment 02/14/2017   MICROALBUR 0.50 09/30/2013   MICROALBUR 0.50 02/05/2012  Needs yearly urine for protein   Goals Addressed            This Visit's Progress     Patient Stated   . " (pt-stated)      . "I'm tired all the time and I'd like to go to physical therapy to help with strengthening my back and increasing my activity tolerance." (pt-stated)       CARE PLAN ENTRY (see longitudinal plan of care for additional care plan information)  Current Barriers:  Chronic Disease Management support, education, and care coordination needs related to HTN, HLD, COPD, DMII, Bipolar Disorder, and Schizoaffective Disorder - patient reports she checks her blood sugar once daily, usually fasting, and reports variance of 109-140, she says she also check her blood pressure and most recent reading was 111/70, patient states she feels tired all the time and would like to see if she can receive OP physical therapy to strengthen her back and increase her activity tolerance, she says the appetite stimulant she was prescribed is working , she reports good medication taking behavior   Clinical Goal(s) related to HTN, HLD, COPD, DMII, Bipolar Disorder, and Schizoaffective Disorder Over the next 30 days, patient will:  . Work with the care management team to address educational, disease management, and care coordination needs  . Begin or continue self health monitoring activities as directed today Measure and record cbg (blood glucose) 1 times daily, Measure and record blood pressure 1-2 times per week . Call provider office for new or worsened signs and symptoms Blood glucose findings outside established parameters, Blood pressure findings outside established parameters, Shortness of breath, and New or worsened symptom related to HTN, HLD, COPD, DMII, Bipolar Disorder, and Schizoaffective Disorder . Call care management team with questions or  concerns . Verbalize basic understanding of patient centered plan of care established today  Interventions related to HTN, HLD, COPD, DMII, Bipolar Disorder, and Schizoaffective Disorder  . Evaluation of current treatment plans and patient's adherence to plan as established by provider . Assessed patient understanding of disease states . Assessed patient's education and care coordination needs Provided disease specific education to patient -mailed [facility] spiral bound calendar with action plans for DM, HTN and COPD  . Collaborated with appropriate clinical care team members regarding patient needs- sent message to provider requesting referral to OP physical therapy for deconditioning and back strengthening therapy   Patient Self Care Activities related to HTN, HLD, COPD, DMII, Bipolar Disorder, and Schizoaffective Disorder:  . Patient is unable to independently self-manage chronic health conditions  Initial goal documentation       Other   . Blood Pressure < 140/90       BP Readings  from Last 3 Encounters:  08/19/19 120/79  07/15/19 116/86  06/18/19 (!) 143/76    Currently meeting BP targets    . HEMOGLOBIN A1C < 7.0       Lab Results  Component Value Date   HGBA1C 7.2 (A) 08/19/2019    Not meeting target A1C    . LDL CALC < 100       Lab Results  Component Value Date   CHOL 86 06/05/2019   HDL 28 (L) 06/05/2019   LDLCALC 34 06/05/2019   TRIG 118 06/05/2019   CHOLHDL 3.1 06/05/2019  Meeting LDL target    . Weight < 195 lb (88.451 kg)       Wt Readings from Last 3 Encounters:  08/19/19 157 lb 12.8 oz (71.6 kg)  06/18/19 171 lb 8.3 oz (77.8 kg)  06/17/19 171 lb 9.6 oz (77.8 kg)   Meeting weight target        Ms. Bustamante was given information about Chronic Care Management services today including:  1. CCM service includes personalized support from designated clinical staff supervised by her physician, including individualized plan of  care and coordination with other care providers 2. 24/7 contact phone numbers for assistance for urgent and routine care needs. 3. Service will only be billed when office clinical staff spend 20 minutes or more in a month to coordinate care. 4. Only one practitioner may furnish and bill the service in a calendar month. 5. The patient may stop CCM services at any time (effective at the end of the month) by phone call to the office staff. 6. The patient will be responsible for cost sharing (co-pay) of up to 20% of the service fee (after annual deductible is met).  Patient agreed to services and verbal consent obtained.   Plan:   The care management team will reach out to the patient again over the next 30 days.   Kelli Churn RN, CCM, [facility] RN Care Manager [phone]

## 2019-09-11 NOTE — Patient Instructions (Addendum)
Visit Information It was nice speaking with you today. I will ask your provider about a referral to outpatient physical therapy for your back.   Goals Addressed            This Visit's Progress     Patient Stated   . " (pt-stated)      . "I'm tired all the time and I'd like to go to physical therapy to help with my back problems." (pt-stated)       [place] (see longitudinal plan of care for additional care plan information)  Current Barriers:  . Chronic Disease Management support, education, and care coordination needs related to HTN, HLD, COPD, DMII, Bipolar Disorder, and Schizoaffective Disorder - patient reports she checks her blood sugar once daily, usually fasting and reports variance of 109-140, she says she also check her blood pressure and most recent reading was 111/70, patient states she feels tired all the time and would like to see if she can receive OP physical therapy to strengthen her back and increase her activity tolerance, she says the appetite stimulant she was prescribed for unintentional weight loss of about 50 lbs is working , she reports good medication taking behavior as she is currently living with her mother and she ensures she takes her medicine  Clinical Goal(s) related to HTN, HLD, COPD, DMII, Bipolar Disorder, and Schizoaffective Disorder Over the next 30 days, patient will:  . Work with the care management team to address educational, disease management, and care coordination needs  . Begin or continue self health monitoring activities as directed today Measure and record cbg (blood glucose) 1 times daily, Measure and record blood pressure 1-2 times per week . Call provider office for new or worsened signs and symptoms Blood glucose findings outside established parameters, Blood pressure findings outside established parameters, Shortness of breath, and New or worsened symptom related to HTN, HLD, COPD, DMII, Bipolar Disorder, and Schizoaffective  Disorder . Call care management team with questions or concerns . Verbalize basic understanding of patient centered plan of care established today  Interventions related to HTN, HLD, COPD, DMII, Bipolar Disorder, and Schizoaffective Disorder  . Evaluation of current treatment plans and patient's adherence to plan as established by provider . Assessed patient understanding of disease states . Assessed patient's education and care coordination needs . Provided disease specific education to patient - mailed [facility] spiral bound calendar with action plans for DM, HTN and COPD  . Collaborated with appropriate clinical care team members regarding patient needs  Patient Self Care Activities related to HTN, HLD, COPD, DMII, Bipolar Disorder, and Schizoaffective Disorder:  . Patient is unable to independently self-manage chronic health conditions  Initial goal documentation       Other   . Blood Pressure < 140/90       BP Readings from Last 3 Encounters:  08/19/19 120/79  07/15/19 116/86  06/18/19 (!) 143/76    Currently meeting BP targets    . HEMOGLOBIN A1C < 7.0       Lab Results  Component Value Date   HGBA1C 7.2 (A) 08/19/2019    Not meeting target A1C    . LDL CALC < 100       Lab Results  Component Value Date   CHOL 86 06/05/2019   HDL 28 (L) 06/05/2019   LDLCALC 34 06/05/2019   TRIG 118 06/05/2019   CHOLHDL 3.1 06/05/2019  Meeting LDL target    . Weight < 195  lb (88.451 kg)       Wt Readings from Last 3 Encounters:  08/19/19 157 lb 12.8 oz (71.6 kg)  06/18/19 171 lb 8.3 oz (77.8 kg)  06/17/19 171 lb 9.6 oz (77.8 kg)   Meeting weight target       Ms. Angell was given information about Chronic Care Management services today including:  1. CCM service includes personalized support from designated clinical staff supervised by her physician, including individualized plan of care and coordination with other care providers 2. 24/7  contact phone numbers for assistance for urgent and routine care needs. 3. Service will only be billed when office clinical staff spend 20 minutes or more in a month to coordinate care. 4. Only one practitioner may furnish and bill the service in a calendar month. 5. The patient may stop CCM services at any time (effective at the end of the month) by phone call to the office staff. 6. The patient will be responsible for cost sharing (co-pay) of up to 20% of the service fee (after annual deductible is met).  Patient agreed to services and verbal consent obtained.   The patient verbalized understanding of instructions provided today and declined a print copy of patient instruction materials.   The care management team will reach out to the patient again over the next 30 days.   Kelli Churn RN, CCM, [facility] RN Care Manager [phone]

## 2019-09-17 NOTE — Progress Notes (Signed)
Patient referred to OP PT for deconditioning.   Ina Homes, MD

## 2019-09-17 NOTE — Addendum Note (Signed)
Addended by: Ina Homes T on: 09/17/2019 03:01 PM   Modules accepted: Orders

## 2019-09-30 ENCOUNTER — Telehealth (INDEPENDENT_AMBULATORY_CARE_PROVIDER_SITE_OTHER): Payer: Medicare Other | Admitting: Psychiatry

## 2019-09-30 ENCOUNTER — Encounter (HOSPITAL_COMMUNITY): Payer: Self-pay | Admitting: Psychiatry

## 2019-09-30 ENCOUNTER — Ambulatory Visit: Payer: Self-pay | Admitting: *Deleted

## 2019-09-30 ENCOUNTER — Other Ambulatory Visit: Payer: Self-pay

## 2019-09-30 ENCOUNTER — Telehealth: Payer: Medicare Other

## 2019-09-30 DIAGNOSIS — F250 Schizoaffective disorder, bipolar type: Secondary | ICD-10-CM | POA: Diagnosis not present

## 2019-09-30 DIAGNOSIS — F419 Anxiety disorder, unspecified: Secondary | ICD-10-CM | POA: Diagnosis not present

## 2019-09-30 MED ORDER — PERPHENAZINE 4 MG PO TABS: 4.0000 mg | ORAL_TABLET | Freq: Two times a day (BID) | ORAL | 1 refills | Status: DC

## 2019-09-30 MED ORDER — MIRTAZAPINE 15 MG PO TABS: 15.0000 mg | ORAL_TABLET | Freq: Every day | ORAL | 1 refills | Status: DC

## 2019-09-30 MED ORDER — SERTRALINE HCL 100 MG PO TABS: 100.0000 mg | ORAL_TABLET | Freq: Every day | ORAL | 1 refills | Status: DC

## 2019-09-30 MED ORDER — BENZTROPINE MESYLATE 1 MG PO TABS: ORAL_TABLET | ORAL | 1 refills | Status: DC

## 2019-09-30 NOTE — Chronic Care Management (AMB) (Signed)
  Chronic Care Management   Outreach Note  09/30/2019 Name: [name] MRN: [id number] DOB: 1965-09-03  Referred by: Ina Homes, MD Reason for referral : Chronic Care Management (DM, HTN, HDL, COPD)   An unsuccessful telephone outreach was attempted today. The patient was referred to the case management team for assistance with care management and care coordination.   Follow Up Plan: A HIPPA compliant phone message was left for the patient providing contact information and requesting a return call.  The care management team will reach out to the patient again over the next 7 days.   Kelli Churn RN, CCM, [facility] RN Care Manager [phone]

## 2019-09-30 NOTE — Progress Notes (Signed)
Virtual Visit via Telephone Note  I connected with [name] on 09/30/19 at  9:20 AM EDT by telephone and verified that I am speaking with the correct person using two identifiers.   I discussed the limitations, risks, security and privacy concerns of performing an evaluation and management service by telephone and the availability of in person appointments. I also discussed with the patient that there may be a patient responsible charge related to this service. The patient expressed understanding and agreed to proceed.   Patient Location: Home in [place] Provider Location: Home Office  History of Present Illness; Patient is evaluated by telephone session.  Interview was scheduled for video session but her son was not present and cannot connect with video.  Patient is currently in [place] staying with her daughter.  We increased Zoloft on the last visit and she noticed some improvement.  She is more mobile and start walking.  She is sleeping better.  Her mother who was present during the session endorsed that medicine helped her depression but is still she has lack of motivation to do things.  She is tolerating the medication and reported no side effects.  She denies any hallucination, paranoia or any suicidal thoughts.  She is taking mirtazapine, Trilafon, Zoloft and Cogentin.  Patient reported when she takes the Trilafon she feels sometimes irritable and agitated.  She reported her mania psychosis is stable.  Her anxiety is somewhat better since she is taking the Zoloft.  Patient has multiple health issues including COPD, diabetes.  Patient has a good support from her mother, daughter and son.  Patient like to cut down the Trilafon since it is making her sometimes agitated.  She has no tremors shakes or any EPS.   Past Psychiatric History: H/O schizoaffective d/o with at least 10inpatient and multiple suicidal attempt. Most significant was jumping from running car and overdose on chlorine  bleach drip and hypertensive medication. Last inpatient in March 2021 in [place]. [facility] and did well on Geodon but last inpatient it was switched to perphenazine. Good Depakote, trazodone, temazepam, mirtazapine, Geodon in the past.   Psychiatric Specialty Exam: Physical Exam  Review of Systems  There were no vitals taken for this visit.There is no height or weight on file to calculate BMI.  General Appearance: NA  Eye Contact:  NA  Speech:  Slow  Volume:  Decreased  Mood:  Dysphoric  Affect:  NA  Thought Process:  Goal Directed  Orientation:  Full (Time, Place, and Person)  Thought Content:  Rumination  Suicidal Thoughts:  No  Homicidal Thoughts:  No  Memory:  Immediate;   Fair Recent;   Fair Remote;   Fair  Judgement:  Intact  Insight:  Present  Psychomotor Activity:  NA  Concentration:  Concentration: Fair and Attention Span: Fair  Recall:  [employer] of Knowledge:  Fair  Language:  Good  Akathisia:  No  Handed:  Right  AIMS (if indicated):     Assets:  Communication Skills Desire for Improvement Housing Resilience Social Support  ADL's:  Intact  Cognition:  WNL  Sleep:   good      Assessment and Plan: Schizoaffective disorder, bipolar type.  Anxiety.  Patient doing better than before.  I recommend to try Zoloft 100 mg to target residual symptoms of depression.  We will decrease Trilafon 4 mg twice a day instead of 3 times a day since patient is complaining about agitation when she takes  the Trilafon.  However I reminded that if symptoms of paranoia and hallucinations come back then we may need to go back to previous dose.  I also recommend to try Cogentin half tablet since tremors are stable.  Continue mirtazapine 15 mg at bedtime.  Patient has not able to find a therapist but also she is living in [place] and not sure when she will back to [place].  She like to have her medication refilled to the local pharmacy.  I recommend to call  us back if is any question or any concern.  We will follow-up in 2 months.  Follow Up Instructions:     I discussed the assessment and treatment plan with the patient. The patient was provided an opportunity to ask questions and all were answered. The patient agreed with the plan and demonstrated an understanding of the instructions.   The patient was advised to call back or seek an in-person evaluation if the symptoms worsen or if the condition fails to improve as anticipated.  I provided 20 minutes of non-face-to-face time during this encounter.   Kathlee Nations, MD

## 2019-10-06 ENCOUNTER — Ambulatory Visit: Payer: Medicare Other | Admitting: Physical Therapy

## 2019-10-07 ENCOUNTER — Telehealth: Payer: Medicare Other

## 2019-10-08 ENCOUNTER — Ambulatory Visit: Payer: Self-pay | Admitting: *Deleted

## 2019-10-08 ENCOUNTER — Telehealth: Payer: Medicare Other | Admitting: *Deleted

## 2019-10-08 DIAGNOSIS — J449 Chronic obstructive pulmonary disease, unspecified: Secondary | ICD-10-CM

## 2019-10-08 DIAGNOSIS — E119 Type 2 diabetes mellitus without complications: Secondary | ICD-10-CM

## 2019-10-08 DIAGNOSIS — I10 Essential (primary) hypertension: Secondary | ICD-10-CM

## 2019-10-08 NOTE — Chronic Care Management (AMB) (Signed)
  Chronic Care Management   Note  10/08/2019 Name: [name] MRN: [id number] DOB: 15-Aug-1965  Attempted to reach patient at contact number to complete follow up telephone assessment. Patient's Mom, Jasper Loser,  answered contact number and states patient "is not doing well" and that she needs clarification on patient's psychiatric medications. She said Dr Adele Schilder gave her 2 phone numbers to call but she misplaced the numbers so she left a message on [phone] but has not received a return call. Provided Ms Sherlyn Lick with another number that was listed for Dr Adele Schilder [phone] and advised her to tray to reach him . Also advised Ms Sherlyn Lick this CCM RN will send and urgent In Basket message to Dr Adele Schilder via Epic since she says her voice message has not been returned.    Follow up plan: The care management team will reach out to the patient again over the next 7-14 days to complete follow up CCM assessment.  Kelli Churn RN, CCM, [facility] RN Care Manager [phone]

## 2019-10-09 ENCOUNTER — Encounter (HOSPITAL_COMMUNITY): Payer: Self-pay

## 2019-10-09 ENCOUNTER — Telehealth (HOSPITAL_COMMUNITY): Payer: Self-pay | Admitting: *Deleted

## 2019-10-09 ENCOUNTER — Ambulatory Visit: Payer: Medicare Other | Admitting: *Deleted

## 2019-10-09 ENCOUNTER — Emergency Department (HOSPITAL_BASED_OUTPATIENT_CLINIC_OR_DEPARTMENT_OTHER)
Admission: EM | Admit: 2019-10-09 | Discharge: 2019-10-12 | Disposition: A | Discharge status: Other Acute Inpatient Hospital | Payer: Medicare Other | Source: Home / Self Care | Attending: Emergency Medicine | Admitting: Emergency Medicine

## 2019-10-09 ENCOUNTER — Other Ambulatory Visit: Payer: Self-pay

## 2019-10-09 DIAGNOSIS — F250 Schizoaffective disorder, bipolar type: Secondary | ICD-10-CM

## 2019-10-09 DIAGNOSIS — E113291 Type 2 diabetes mellitus with mild nonproliferative diabetic retinopathy without macular edema, right eye: Secondary | ICD-10-CM | POA: Insufficient documentation

## 2019-10-09 DIAGNOSIS — Z794 Long term (current) use of insulin: Secondary | ICD-10-CM

## 2019-10-09 DIAGNOSIS — Z7901 Long term (current) use of anticoagulants: Secondary | ICD-10-CM | POA: Insufficient documentation

## 2019-10-09 DIAGNOSIS — J449 Chronic obstructive pulmonary disease, unspecified: Secondary | ICD-10-CM | POA: Insufficient documentation

## 2019-10-09 DIAGNOSIS — Z79899 Other long term (current) drug therapy: Secondary | ICD-10-CM | POA: Insufficient documentation

## 2019-10-09 DIAGNOSIS — I5030 Unspecified diastolic (congestive) heart failure: Secondary | ICD-10-CM | POA: Insufficient documentation

## 2019-10-09 DIAGNOSIS — Z20822 Contact with and (suspected) exposure to covid-19: Secondary | ICD-10-CM | POA: Insufficient documentation

## 2019-10-09 DIAGNOSIS — I10 Essential (primary) hypertension: Secondary | ICD-10-CM

## 2019-10-09 DIAGNOSIS — F17210 Nicotine dependence, cigarettes, uncomplicated: Secondary | ICD-10-CM | POA: Insufficient documentation

## 2019-10-09 DIAGNOSIS — Z008 Encounter for other general examination: Secondary | ICD-10-CM

## 2019-10-09 DIAGNOSIS — F319 Bipolar disorder, unspecified: Secondary | ICD-10-CM

## 2019-10-09 DIAGNOSIS — I110 Hypertensive heart disease with heart failure: Secondary | ICD-10-CM | POA: Insufficient documentation

## 2019-10-09 NOTE — Telephone Encounter (Signed)
If patient is cheeking her medication then there is no reason to increase the dose.  Her medicine needs to be supervised and if she do not get better then needs to be evaluated and taken to the emergency room.

## 2019-10-09 NOTE — Telephone Encounter (Signed)
Writer received message from pt mother, [name], regarding pt medication and possible changes. Writer spoke with mother this morning and she stated that while she was in [place] pt brother was to give pt her medication, which he did however she evidently was cheeking all meds and now is decompensating. Mother states pt keeps hysterically crying and while she denies s.i., avh. Mother fears for pt safety due to past bx. Med ed reinforced. Writer encouraged mother to take pt to [facility] or [facility] if needed. Please review.

## 2019-10-09 NOTE — ED Triage Notes (Signed)
Pt attempted to go to [facility] for evaluation and medication adjustment. [facility] said come to the ER. Pt called staff [facility] and is not making sense.

## 2019-10-09 NOTE — Chronic Care Management (AMB) (Signed)
  Chronic Care Management   Note  10/09/2019 Name: [name] MRN: [id number] DOB: [birth year]  Received Epic In Basket message from Dr. Adele Schilder (psychiatrist) at [facility] stating he received this CCM RN's message and that he will have his nurse reach out to patient's Mom today. Sent reply to Dr Adele Schilder thanking him for follow up with patient's Mom.   Follow up plan: The care management team will reach out to the patient again over the next 7-14 days.   Kelli Churn RN, CCM, [facility] RN Care Manager [phone]

## 2019-10-10 LAB — RAPID URINE DRUG SCREEN, HOSP PERFORMED
Amphetamines: NOT DETECTED
Barbiturates: NOT DETECTED
Benzodiazepines: NOT DETECTED
Cocaine: NOT DETECTED
Opiates: NOT DETECTED
Tetrahydrocannabinol: NOT DETECTED

## 2019-10-10 LAB — COMPREHENSIVE METABOLIC PANEL
ALT: 11 U/L (ref 0–44)
AST: 12 U/L — ABNORMAL LOW (ref 15–41)
Albumin: 4.4 g/dL (ref 3.5–5.0)
Alkaline Phosphatase: 66 U/L (ref 38–126)
Anion gap: 12 (ref 5–15)
BUN: 20 mg/dL (ref 6–20)
CO2: 19 mmol/L — ABNORMAL LOW (ref 22–32)
Calcium: 10 mg/dL (ref 8.9–10.3)
Chloride: 106 mmol/L (ref 98–111)
Creatinine, Ser: 1.25 mg/dL — ABNORMAL HIGH (ref 0.44–1.00)
GFR calc Af Amer: 58 mL/min — ABNORMAL LOW (ref >60)
GFR calc non Af Amer: 50 mL/min — ABNORMAL LOW (ref >60)
Glucose, Bld: 179 mg/dL — ABNORMAL HIGH (ref 70–99)
Potassium: 4 mmol/L (ref 3.5–5.1)
Sodium: 137 mmol/L (ref 135–145)
Total Bilirubin: 0.5 mg/dL (ref 0.3–1.2)
Total Protein: 7.5 g/dL (ref 6.5–8.1)

## 2019-10-10 LAB — CBC
HCT: 43.7 % (ref 36.0–46.0)
Hemoglobin: 14.6 g/dL (ref 12.0–15.0)
MCH: 29.3 pg (ref 26.0–34.0)
MCHC: 33.4 g/dL (ref 30.0–36.0)
MCV: 87.6 fL (ref 80.0–100.0)
Platelets: 222 10*3/uL (ref 150–400)
RBC: 4.99 MIL/uL (ref 3.87–5.11)
RDW: 14.8 % (ref 11.5–15.5)
WBC: 8.7 10*3/uL (ref 4.0–10.5)
nRBC: 0 % (ref 0.0–0.2)

## 2019-10-10 LAB — I-STAT BETA HCG BLOOD, ED (MC, WL, AP ONLY): I-stat hCG, quantitative: <5 m[IU]/mL (ref <5)

## 2019-10-10 LAB — ACETAMINOPHEN LEVEL: Acetaminophen (Tylenol), Serum: <10 ug/mL — ABNORMAL LOW (ref 10–30)

## 2019-10-10 LAB — SALICYLATE LEVEL: Salicylate Lvl: <7 mg/dL — ABNORMAL LOW (ref 7.0–30.0)

## 2019-10-10 LAB — ETHANOL: Alcohol, Ethyl (B): <10 mg/dL (ref <10)

## 2019-10-10 LAB — SARS CORONAVIRUS 2 BY RT PCR (HOSPITAL ORDER, PERFORMED IN ~~LOC~~ HOSPITAL LAB): SARS Coronavirus 2: NEGATIVE

## 2019-10-10 MED ORDER — SPIRONOLACTONE 25 MG PO TABS
50.0000 mg | ORAL_TABLET | Freq: Every day | ORAL | Status: DC
  Administered 2019-10-10 – 2019-10-11 (×2): 50 mg via ORAL
  Filled 2019-10-10 (×4): qty 2

## 2019-10-10 MED ORDER — RISPERIDONE 2 MG PO TABS: 2.0000 mg | ORAL_TABLET | Freq: Two times a day (BID) | ORAL | Status: DC

## 2019-10-10 MED ORDER — METFORMIN HCL 500 MG PO TABS
1000.0000 mg | ORAL_TABLET | Freq: Two times a day (BID) | ORAL | Status: DC
  Administered 2019-10-10 – 2019-10-12 (×3): 1000 mg via ORAL
  Filled 2019-10-10 (×4): qty 2

## 2019-10-10 MED ORDER — STERILE WATER FOR INJECTION IJ SOLN
INTRAMUSCULAR | Status: AC
  Administered 2019-10-10: 1.2 mL
  Filled 2019-10-10: qty 10

## 2019-10-10 MED ORDER — THIAMINE HCL 100 MG PO TABS
100.0000 mg | ORAL_TABLET | Freq: Every day | ORAL | Status: DC
  Administered 2019-10-10 – 2019-10-11 (×2): 100 mg via ORAL
  Filled 2019-10-10 (×3): qty 1

## 2019-10-10 MED ORDER — SENNOSIDES-DOCUSATE SODIUM 8.6-50 MG PO TABS: 1.0000 | ORAL_TABLET | Freq: Every evening | ORAL | Status: DC | PRN

## 2019-10-10 MED ORDER — NICOTINE 21 MG/24HR TD PT24: 21.0000 mg | MEDICATED_PATCH | Freq: Every day | TRANSDERMAL | Status: DC

## 2019-10-10 MED ORDER — ZIPRASIDONE MESYLATE 20 MG IM SOLR: 10.0000 mg | Freq: Once | INTRAMUSCULAR | Status: DC | PRN

## 2019-10-10 MED ORDER — PANTOPRAZOLE SODIUM 40 MG PO TBEC
40.0000 mg | DELAYED_RELEASE_TABLET | Freq: Two times a day (BID) | ORAL | Status: DC
  Administered 2019-10-10 – 2019-10-11 (×2): 40 mg via ORAL
  Filled 2019-10-10 (×4): qty 1

## 2019-10-10 MED ORDER — MIRTAZAPINE 7.5 MG PO TABS
15.0000 mg | ORAL_TABLET | Freq: Every day | ORAL | Status: DC
  Filled 2019-10-10: qty 2

## 2019-10-10 MED ORDER — LOSARTAN POTASSIUM 50 MG PO TABS
50.0000 mg | ORAL_TABLET | Freq: Every day | ORAL | Status: DC
  Administered 2019-10-10 – 2019-10-11 (×2): 50 mg via ORAL
  Filled 2019-10-10 (×4): qty 1

## 2019-10-10 MED ORDER — PRAVASTATIN SODIUM 20 MG PO TABS
40.0000 mg | ORAL_TABLET | Freq: Every day | ORAL | Status: DC
  Filled 2019-10-10: qty 2

## 2019-10-10 MED ORDER — ZIPRASIDONE MESYLATE 20 MG IM SOLR
20.0000 mg | Freq: Once | INTRAMUSCULAR | Status: AC | PRN
  Administered 2019-10-10: 20 mg via INTRAMUSCULAR
  Filled 2019-10-10: qty 20

## 2019-10-10 MED ORDER — ALUM & MAG HYDROXIDE-SIMETH 200-200-20 MG/5ML PO SUSP: 30.0000 mL | Freq: Four times a day (QID) | ORAL | Status: DC | PRN

## 2019-10-10 MED ORDER — FERROUS SULFATE 325 (65 FE) MG PO TABS
325.0000 mg | ORAL_TABLET | ORAL | Status: DC
  Administered 2019-10-10 – 2019-10-12 (×3): 325 mg via ORAL
  Filled 2019-10-10 (×3): qty 1

## 2019-10-10 MED ORDER — ALBUTEROL SULFATE HFA 108 (90 BASE) MCG/ACT IN AERS: 1.0000 | INHALATION_SPRAY | Freq: Four times a day (QID) | RESPIRATORY_TRACT | Status: DC | PRN

## 2019-10-10 MED ORDER — SERTRALINE HCL 50 MG PO TABS
100.0000 mg | ORAL_TABLET | Freq: Every day | ORAL | Status: DC
  Administered 2019-10-10 – 2019-10-12 (×3): 100 mg via ORAL
  Filled 2019-10-10 (×3): qty 2

## 2019-10-10 MED ORDER — ACETAMINOPHEN 325 MG PO TABS: 650.0000 mg | ORAL_TABLET | Freq: Three times a day (TID) | ORAL | Status: DC | PRN

## 2019-10-10 MED ORDER — METOPROLOL TARTRATE 25 MG PO TABS
100.0000 mg | ORAL_TABLET | Freq: Two times a day (BID) | ORAL | Status: DC
  Administered 2019-10-10 – 2019-10-11 (×2): 100 mg via ORAL
  Filled 2019-10-10 (×3): qty 4

## 2019-10-10 MED ORDER — INSULIN GLARGINE 100 UNIT/ML ~~LOC~~ SOLN
10.0000 [IU] | Freq: Every day | SUBCUTANEOUS | Status: DC
  Administered 2019-10-10 – 2019-10-11 (×2): 10 [IU] via SUBCUTANEOUS
  Filled 2019-10-10 (×3): qty 0.1

## 2019-10-10 MED ORDER — IBUPROFEN 200 MG PO TABS: 600.0000 mg | ORAL_TABLET | Freq: Three times a day (TID) | ORAL | Status: DC | PRN

## 2019-10-10 MED ORDER — PERPHENAZINE 4 MG PO TABS
4.0000 mg | ORAL_TABLET | Freq: Two times a day (BID) | ORAL | Status: DC
  Administered 2019-10-10: 4 mg via ORAL
  Filled 2019-10-10: qty 1

## 2019-10-10 MED ORDER — ONDANSETRON HCL 4 MG PO TABS: 4.0000 mg | ORAL_TABLET | Freq: Three times a day (TID) | ORAL | Status: DC | PRN

## 2019-10-10 MED ORDER — AMLODIPINE BESYLATE 5 MG PO TABS
10.0000 mg | ORAL_TABLET | Freq: Every day | ORAL | Status: DC
  Administered 2019-10-10 – 2019-10-12 (×3): 10 mg via ORAL
  Filled 2019-10-10 (×3): qty 2

## 2019-10-10 MED ORDER — PALIPERIDONE ER 3 MG PO TB24
3.0000 mg | ORAL_TABLET | Freq: Every day | ORAL | Status: DC
  Filled 2019-10-10 (×2): qty 1

## 2019-10-10 NOTE — ED Notes (Signed)
Pt refuses vitals and to give urine sample

## 2019-10-10 NOTE — ED Notes (Signed)
Patient is resting comfortably. 

## 2019-10-10 NOTE — ED Provider Notes (Addendum)
[facility] DEPT Provider Note   CSN: [id number] Arrival date & time: 10/09/19  2211     History Chief Complaint  Patient presents with  . Medical Clearance    [name] is a 54 y.o. female with a history of schizoaffective disorder, bipolar 1 disorder, T2DM, anemia, alcohol abuse, hypertension, and hyperlipidemia who presents to the emergency department with her son for psychiatric evaluation.  Patient son states that the patient has been off of her medications for the past year and a half, she has had psychiatric issues since stopping her medications further described as talking nonsensically and talking to herself.  He states that this evening she made comments about "killing them all" which concerned him and prompted ED visit.  The patient lives with her mother.  He states that she does not drink alcohol or utilize any illicit substances.  She did restart her medications a few months ago, however this has not made any impact at this point.  When I asked the patient brings her into the emergency department she states that she is aggravated, she denies suicidal ideations or hallucinations, she states that she has had homicidal ideations but does not further evaluate on this.  She denies any fever, chest pain, abdominal pain, dyspnea, or syncope.  HPI     Past Medical History:  Diagnosis Date  . Alcohol abuse   . Anemia, iron deficiency   . Arteriosclerotic cardiovascular disease (ASCVD)    Minimal at cath in [facility].stress nuclear study in 8/08 with nl EF; neg stress echo in 2010  . Community acquired pneumonia 01/03/10, 05/2010, 04/2012   2011; with pleural effusion-hosp [facility] acute resp failure; intubated in Jan 2014 (HMPV pneumonia)  . Depression   . Diabetes mellitus, type 2 ([place]) 2000   Onset in 2000; no insulin  . Diarrhea 10/14/2013   Started 10/10/13, improved with Imodium.   . Diastolic dysfunction    grade 2 per echo 2011   . Dysphagia   . Gastroesophageal reflux disease    Schatzki's ring  . History of alcohol abuse 07/22/2007   Qualifier: Diagnosis of  By: Lenn Cal    . Hyperlipidemia   . Hypertension `   during treatment with Geodon  . Hypokalemia 12/25/2013  . Left knee pain 08/25/2014  . Obesity   . Oral candidiasis 05/16/2017  . PTSD (post-traumatic stress disorder)   . Pulmonary hypertension ([place]) 05/02/2012   Patient needs repeat echo in 06/2012   . Schizoaffective disorder    requiring multiple psychiatric admissions  . Viral URI 05/12/2013  . Viral wart on finger 10/08/2016  . Vision changes 08/12/2014    Patient Active Problem List   Diagnosis Date Noted  . Weight loss 06/15/2019  . Yeast infection 06/11/2019  . Lithium toxicity 06/03/2019  . Warts 02/19/2019  . Bipolar I disorder, most recent episode (or current) manic ([place]) 09/14/2018  . Schizoaffective disorder, bipolar type ([place]) 08/26/2017  . Immunity status testing 04/17/2017  . Nuclear sclerosis of both eyes 04/02/2016  . 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 Encounter for health education 10/24/2014  . IIH (idiopathic intracranial hypertension) 08/12/2014  . Seasonal allergies 02/23/2014  . Daytime hypersomnolence 12/09/2012  . Insomnia 11/06/2012  . COPD (chronic obstructive pulmonary disease) ([place]) 10/20/2012  . GERD (gastroesophageal reflux disease) 10/15/2012  . Preventative health care 10/15/2012  . Pulmonary  hypertension ([place]) 05/02/2012  . Diastolic heart failure ([place]) 02/07/2012  . IDA (iron deficiency anemia) 11/06/2011  . Nausea & vomiting 07/06/2011  . Fatigue 05/17/2011  . Arteriosclerotic cardiovascular disease (ASCVD)   . TOBACCO ABUSE 09/27/2009  . Back pain 12/13/2008  . Hyperlipidemia 07/22/2007  . Obesity 07/22/2007  . Bipolar disorder, current episode hypomanic ([place])  07/22/2007  . Resistant hypertension 07/22/2007  . Diabetes mellitus type 2, controlled ([place]) 09/28/2002    Past Surgical History:  Procedure Laterality Date  . COLONOSCOPY  01/2006   internal hemorrhoids  . COLONOSCOPY  01/10/2012   Dr. Rourk:Single anal canal hemorrhoidal tag likely source of  trivial hematochezia; right-sided colonic diverticulosis  . DILATION AND CURETTAGE, DIAGNOSTIC / THERAPEUTIC  1992  . ESOPHAGEAL DILATION N/A 08/18/2015   Procedure: ESOPHAGEAL DILATION;  Surgeon: Daneil Dolin, MD;  Location: [facility] ENDO SUITE;  Service: Endoscopy;  Laterality: N/A;  . ESOPHAGOGASTRODUODENOSCOPY  09/16/08   Dr. Trevor Iha hiatal hernia/excoriations involving the cardia and mucosa consistent with trauma, antral erosions  of linear petechiae ? gastritis versus early gastric antral vascular  ectasia.Marland Kitchen biopsy showed reactive gastropathy. No H. pylori.  . ESOPHAGOGASTRODUODENOSCOPY  09/2007   Dr. Evalee Mutton ring, dilated to 4 French Maloney dilator, small hiatal hernia, antral erosions, biopsies reactive gastropathy.  . ESOPHAGOGASTRODUODENOSCOPY (EGD) WITH PROPOFOL N/A 12/17/2013   RJJ:[id number] antral erosions and petechiae. Small hiatal hernia. No endoscopic explanation for patient's symptoms  . ESOPHAGOGASTRODUODENOSCOPY (EGD) WITH PROPOFOL N/A 08/18/2015   Dr. Gala Romney: normal exam, s/p esophageal dilation  . ESOPHAGOGASTRODUODENOSCOPY (EGD) WITH PROPOFOL N/A 06/18/2019   Procedure: ESOPHAGOGASTRODUODENOSCOPY (EGD) WITH PROPOFOL;  Surgeon: Daneil Dolin, MD;  Location: [facility] SUITE;  Service: Endoscopy;  Laterality: N/A;  2:30pm - office moved to 10:15  . SAVORY DILATION  07/17/2011   Fields-MAC sedation-->distal esophageal stricture s/p dilation, chronic gastritis, multiple ulcers in stomach. no h.pylori     OB History    Gravida  3   Para  2   Term  2   Preterm      AB  1   Living  2     SAB      TAB  1   Ectopic      Multiple      Live Births  2            Family History  Problem Relation Age of Onset  . Hypertension Mother   . Stroke Father        deceased at age 67  . Colon cancer Other   . Heart disease Sister   . Diabetes Other   . High Cholesterol Other   . Arthritis Other   . Anesthesia problems Neg Hx   . Hypotension Neg Hx   . Malignant hyperthermia Neg Hx   . Pseudochol deficiency Neg Hx     Social History   Tobacco Use  . Smoking status: Current Some Day Smoker    Packs/day: 1.00    Years: 30.00    Pack years: 30.00    Types: Cigarettes    Start date: 05/18/2012  . Smokeless tobacco: Never Used  . Tobacco comment: 1 PPD  Vaping Use  . Vaping Use: Never used  Substance Use Topics  . Alcohol use: No    Alcohol/week: 0.0 standard drinks    Comment: hx of ETOH abuse  . Drug use: No    Home Medications Prior to Admission medications   Medication Sig Start Date End Date Taking? Authorizing Provider  Accu-Chek Softclix Lancets lancets Use as instructed 09/09/19   Ina Homes, MD  albuterol (VENTOLIN HFA) 108 (90 Base) MCG/ACT inhaler Inhale 1-2 puffs into the lungs every 6 (six) hours as needed for wheezing or shortness of breath. 09/24/17   Axel Filler, MD  amLODipine (NORVASC) 10 MG tablet Take 1 tablet (10 mg total) by mouth daily. For high blood pressure Patient taking differently: Take 10 mg by mouth daily.  12/17/18 12/17/19  Ina Homes, MD  benztropine (COGENTIN) 1 MG tablet Take 1/2 to one tab as needed for tremor 09/30/19   Arfeen, Arlyce Harman, MD  ferrous sulfate 325 (65 FE) MG tablet Take 1 tablet (325 mg total) by mouth every other day. (May buy from over the counter): For anemia Patient taking differently: Take 325 mg by mouth every other day.  09/24/17   Axel Filler, MD  insulin glargine (LANTUS) 100 UNIT/ML injection Inject 0.1 mLs (10 Units total) into the skin daily. Patient taking differently: Inject 10 Units into the skin daily.  07/16/19   Rankin, Shuvon B, NP  losartan (COZAAR)  50 MG tablet Take 1 tablet (50 mg total) by mouth daily. 08/11/19   Ina Homes, MD  lovastatin (MEVACOR) 40 MG tablet Take 1 tablet (40 mg total) by mouth every evening. 08/26/19 11/24/19  Ina Homes, MD  metFORMIN (GLUCOPHAGE) 1000 MG tablet Take 1 tablet (1,000 mg total) by mouth 2 (two) times daily with a meal. 08/26/19   Helberg, Larkin Ina, MD  metoprolol tartrate (LOPRESSOR) 100 MG tablet Take 1 tablet (100 mg total) by mouth 2 (two) times daily. 08/26/19   Ina Homes, MD  mirtazapine (REMERON) 15 MG tablet Take 1 tablet (15 mg total) by mouth at bedtime. 09/30/19 09/29/20  Arfeen, Arlyce Harman, MD  nicotine (NICODERM CQ - DOSED IN MG/24 HR) 7 mg/24hr patch Place 1 patch (7 mg total) onto the skin daily. 06/11/19   Helberg, Larkin Ina, MD  ondansetron (ZOFRAN) 4 MG tablet TAKE 1 TABLET(4 MG) BY MOUTH EVERY 4 HOURS AS NEEDED FOR NAUSEA OR VOMITING 07/24/19   Annitta Needs, NP  pantoprazole (PROTONIX) 40 MG tablet Take 40 mg by mouth 2 (two) times daily. 06/18/19   [provider]  perphenazine (TRILAFON) 4 MG tablet Take 1 tablet (4 mg total) by mouth 2 (two) times daily. 09/30/19   Arfeen, Arlyce Harman, MD  senna-docusate (SENOKOT-S) 8.6-50 MG tablet Take 1 tablet by mouth at bedtime as needed for mild constipation. 06/05/19   Mercy Riding, MD  sertraline (ZOLOFT) 100 MG tablet Take 1 tablet (100 mg total) by mouth daily. 09/30/19   Arfeen, Arlyce Harman, MD  spironolactone (ALDACTONE) 50 MG tablet Take 1 tablet (50 mg total) by mouth daily. 09/09/19   Ina Homes, MD  Thiamine HCl (VITAMIN B-1) 100 MG TABS Take 1 tablet (100 mg total) by mouth daily. 06/05/19   Mercy Riding, MD    Allergies    Cephalexin, Metronidazole, Orange, Orange oil, Other, Shrimp [shellfish allergy], Penicillins, Sulfonamide derivatives, Glipizide, Ace inhibitors, Sulfamethoxazole, Sulfamethoxazole-trimethoprim, and Trimethoprim  Review of Systems   Review of Systems  Constitutional: Negative for chills and fever.  Respiratory:  Negative for shortness of breath.   Cardiovascular: Negative for chest pain.  Gastrointestinal: Negative for abdominal pain.  Neurological: Negative for syncope.  Psychiatric/Behavioral: Positive for behavioral problems (Abnormal). Negative for suicidal ideas.       Positive for homicidal ideation.  All other systems reviewed and are negative.   Physical Exam Updated  Vital Signs BP (!) 137/93   Pulse 77   Temp 99.1 F (37.3 C)   Resp 16   Ht _0  (1.626 m)   Wt 71 kg   SpO2 99%   BMI 26.87 kg/m   Physical Exam Vitals and nursing note reviewed.  Constitutional:      General: She is not in acute distress.    Appearance: She is well-developed. She is not toxic-appearing.  HENT:     Head: Normocephalic and atraumatic.  Eyes:     General:        Right eye: No discharge.        Left eye: No discharge.     Conjunctiva/sclera: Conjunctivae normal.  Cardiovascular:     Rate and Rhythm: Normal rate and regular rhythm.  Pulmonary:     Effort: Pulmonary effort is normal. No respiratory distress.     Breath sounds: Normal breath sounds. No wheezing, rhonchi or rales.  Abdominal:     General: There is no distension.     Palpations: Abdomen is soft.     Tenderness: There is no abdominal tenderness.  Musculoskeletal:     Cervical back: Neck supple.  Skin:    General: Skin is warm and dry.     Findings: No rash.  Neurological:     Mental Status: She is alert.     Comments: Clear speech.   Psychiatric:     Comments: Patient speaking to herself quietly, she makes intermittent religious references, concerned she is responding to internal stimuli.  She denies suicidal ideation, admits to homicidal ideation.  No plan specified.     ED Results / Procedures / Treatments   Labs (all labs ordered are listed, but only abnormal results are displayed) Labs Reviewed  COMPREHENSIVE METABOLIC PANEL - Abnormal; Notable for the following components:      Result Value   CO2 19 (*)     Glucose, Bld 179 (*)    Creatinine, Ser 1.25 (*)    AST 12 (*)    GFR calc non Af Amer 50 (*)    GFR calc Af Amer 58 (*)    All other components within normal limits  SALICYLATE LEVEL - Abnormal; Notable for the following components:   Salicylate Lvl <5.6 (*)    All other components within normal limits  ACETAMINOPHEN LEVEL - Abnormal; Notable for the following components:   Acetaminophen (Tylenol), Serum <10 (*)    All other components within normal limits  SARS CORONAVIRUS 2 BY RT PCR (HOSPITAL ORDER, [facility])  CBC  ETHANOL  RAPID URINE DRUG SCREEN, HOSP PERFORMED  I-STAT BETA HCG BLOOD, ED ([facility], [facility], [facility] ONLY)    EKG None  Radiology No results found.  Procedures .Critical Care Performed by: Amaryllis Dyke, PA-C Authorized by: Amaryllis Dyke, PA-C    CRITICAL CARE Performed by: Kennith Maes   Total critical care time: 30 minutes  Critical care time was exclusive of separately billable procedures and treating other patients.  Critical care was necessary to treat or prevent imminent or life-threatening deterioration.  Critical care was time spent personally by me on the following activities: development of treatment plan with patient and/or surrogate as well as nursing, discussions with consultants, evaluation of patient's response to treatment, examination of patient, obtaining history from patient or surrogate, ordering and performing treatments and interventions, ordering and review of laboratory studies, ordering and review of radiographic studies, pulse oximetry and re-evaluation of patient's condition.   (  including critical care time)  Medications Ordered in ED Medications  thiamine tablet 100 mg (has no administration in time range)  spironolactone (ALDACTONE) tablet 50 mg (has no administration in time range)  sertraline (ZOLOFT) tablet 100 mg (has no administration in time range)  senna-docusate (Senokot-S)  tablet 1 tablet (has no administration in time range)  perphenazine (TRILAFON) tablet 4 mg (has no administration in time range)  pantoprazole (PROTONIX) EC tablet 40 mg (40 mg Oral Not Given 10/10/19 0300)  mirtazapine (REMERON) tablet 15 mg (has no administration in time range)  metoprolol tartrate (LOPRESSOR) tablet 100 mg (100 mg Oral Not Given 10/10/19 0300)  metFORMIN (GLUCOPHAGE) tablet 1,000 mg (has no administration in time range)  pravastatin (PRAVACHOL) tablet 40 mg (has no administration in time range)  losartan (COZAAR) tablet 50 mg (has no administration in time range)  insulin glargine (LANTUS) injection 10 Units (has no administration in time range)  ferrous sulfate tablet 325 mg (has no administration in time range)  amLODipine (NORVASC) tablet 10 mg (has no administration in time range)  albuterol (VENTOLIN HFA) 108 (90 Base) MCG/ACT inhaler 1-2 puff (has no administration in time range)  ondansetron (ZOFRAN) tablet 4 mg (has no administration in time range)  alum & mag hydroxide-simeth (MAALOX/MYLANTA) 200-200-20 MG/5ML suspension 30 mL (has no administration in time range)  nicotine (NICODERM CQ - dosed in mg/24 hours) patch 21 mg (has no administration in time range)  acetaminophen (TYLENOL) tablet 650 mg (has no administration in time range)  sterile water (preservative free) injection (has no administration in time range)  ziprasidone (GEODON) injection 20 mg (20 mg Intramuscular Given 10/10/19 5462)    ED Course  I have reviewed the triage vital signs and the nursing notes.  Pertinent labs & imaging results that were available during my care of the patient were reviewed by me and considered in my medical decision making (see chart for details).  [name] was evaluated in Emergency Department on 10/10/2019 for the symptoms described in the history of present illness. He/she was evaluated in the context of the global COVID-19 pandemic, which necessitated  consideration that the patient might be at risk for infection with the SARS-CoV-2 virus that causes COVID-19. Institutional protocols and algorithms that pertain to the evaluation of patients at risk for COVID-19 are in a state of rapid change based on information released by regulatory bodies including the CDC and federal and state organizations. These policies and algorithms were followed during the patient's care in the ED.    MDM Rules/Calculators/A&P                         Patient presents to the emergency department for psychiatric evaluation due to homicidal comments this evening with concern for psychiatric health since stopping her medicine a year and a half ago.  Patient is talking to herself, making religious references, and appears to be responding to internal stimuli on my assessment.  She does have a psychiatric history with prior admissions.  Plan for screening labs.  I have ordered, reviewed, and interpreted labs including CBC, CMP, ethanol level, salicylate level, Tylenol level, Covid testing, and pregnancy test: Fairly unremarkable and similar to patient's prior labs on record.  Patient medically cleared, evaluated by TTS, recommendation for inpatient.  Placed under IVC as she was becoming agitated & trying to leave-. Geodon ordered as needed as patient. Recent EKG with normal QTc.   The patient has been placed in  psychiatric observation due to the need to provide a safe environment for the patient while obtaining psychiatric consultation and evaluation, as well as ongoing medical and medication management to treat the patient's condition.  The patient has been placed under full IVC at this time.  Findings and plan of care discussed with supervising physician Dr. Leonette Monarch who has evaluated patient & is in agreement.    Final Clinical Impression(s) / ED Diagnoses Final diagnoses:  None    Rx / DC Orders ED Discharge Orders    None       Leafy Kindle 10/10/19 4037    Fatima Blank, MD 10/10/19 0707    Amaryllis Dyke, PA-C 10/11/19 0155    Fatima Blank, MD 10/11/19 [phone]

## 2019-10-10 NOTE — ED Notes (Signed)
[facility] speaking with patient.

## 2019-10-10 NOTE — Progress Notes (Signed)
Patient will need psychiatric hospitalization inpatient.  Pleasant and cooperative today, no agitation.  Waylan Boga, PMHNP

## 2019-10-10 NOTE — ED Notes (Signed)
This RN and another RN made multiple attempts to administer the pts ordered medications, but pt continues to refuse.

## 2019-10-10 NOTE — [facility] Counselor (Signed)
Pt's son wanted to be notified if the his mother is sent to a facility for inpatient treatment. Clinician expressed the pt has to provide consent before any information to be shared. Pt's son asked for the pt to consent, pt did not respond.   Clinician expressed that if the pt is placed her nurse could ask if she wanted anyone to know that she was placed. Clinician express to the son the pt can refuse.  Pt's son reported, the pt almost died while at [facility] she was given (Lithium).   Vertell Novak, [place], [facility], [facility] Triage Specialist [phone]

## 2019-10-10 NOTE — BH Assessment (Addendum)
Comprehensive Clinical Assessment (CCA) Note  10/10/2019 [name] [id number]  Visit Diagnosis:  Schizoaffective disorder, bipolar type ([name]).  Pt presents voluntary and accompanied by her son [name]) to [facility]. Clinician asked the pt her name. Pt responded in a soft voice clinician asked her to repeat herself. Pt reported, "I don't know why I came here I'm okay." Clinician asked the pt, "what brought you to the hospital?" Clinician observed the pt not making eye contact and speaking very soft. Clinician repeated the question. Pt responded softly. Pt's son reported, "the pt said, thought she was running a fever." Pt's son reported, his mother is not herself, she has been off her medication for a year and a half, she is not taking medications have not caught up with her (in her system). Pt's son reported, the pt told him, "we should kill them all." Clinician observed the pt speaking low then raising her voice and cursing at staff ("who the fuck, you bastards, fuck you up"), during the assessment. Clinician asked the pt if she had recent or previous HI. Pt responded softly clinician asked the pt to repeat her response. Pt's son reported, his mother said, "she's not a human, I want to fuck everybody up." Clinician asked the pt if she's experiencing and AVH. Pt reported, "you know what I hear, I'll fuck your motherfucking asses up."   Per [name] note on 00/17/4944 at [facility]; "Writer received message from pt mother, [name], regarding pt medication and possible changes. Writer spoke with mother this morning and she stated that while she was in [place] pt brother was to give pt her medication, which he did however she evidently was cheeking all meds and now is decompensating. Mother states pt keeps hysterically crying and while she denies s.i., avh. Mother fears for pt safety due to past bx. Med ed reinforced. Writer encouraged mother to take pt  to [facility] or [facility] if needed. Please review."   *Clinician was unable to complete pt's  PHQ-9 questionnaire due to pt's AMS.*  CCA Screening, Triage and Referral (STR)  Patient Reported Information How did you hear about [name]? Other (Comment) (Per chart, pt's mother called pt's psychiatrist office to discus her behaviors, Rachel Lull, [name] recommended the pt come to the ED or [facility] for an evaluation.)  Referral name: [name].  Referral phone number: No data recorded  Whom do you see for routine medical problems? No data recorded Practice/Facility Name: No data recorded Practice/Facility Phone Number: No data recorded Name of Contact: No data recorded Contact Number: No data recorded Contact Fax Number: No data recorded Prescriber Name: No data recorded Prescriber Address (if known): No data recorded  What Is the Reason for Your Visit/Call Today? Pt has AMS.  How Long Has This Been Causing You Problems? > than 6 months  What Do You Feel Would Help You the Most Today? No data recorded  Have You Recently Been in Any Inpatient Treatment (Hospital/Detox/Crisis Center/28-Day Program)? No data recorded Name/Location of Program/Hospital:No data recorded How Long Were You There? No data recorded When Were You Discharged? No data recorded  Have You Ever Received Services From [facility] Before? No data recorded Who Do You See at [facility]? No data recorded  Have You Recently Had Any Thoughts About Hurting Yourself? No (Pt denies.)  Are You Planning to Commit Suicide/Harm Yourself At This time? No data recorded  Have you Recently Had Thoughts About [place]? No data recorded  Explanation: No data recorded  Have You Used Any Alcohol or Drugs in the Past 24 Hours? No data recorded How Long Ago Did You Use Drugs or Alcohol? No data recorded What Did You Use and How Much? No data recorded  Do You Currently Have a Therapist/Psychiatrist? Yes  Name of  Therapist/Psychiatrist: [name]   Have You Been Recently Discharged From Any Office Practice or Programs? No data recorded Explanation of Discharge From Practice/Program: No data recorded    CCA Screening Triage Referral Assessment Type of Contact: Tele-Assessment  Is this Initial or Reassessment? Initial Assessment  Date Telepsych consult ordered in [facility]:  10/10/19  Time Telepsych consult ordered in [facility]:  0028   Patient Reported Information Reviewed? No data recorded Patient Left Without Being Seen? No data recorded Reason for Not Completing Assessment: No data recorded  Collateral Involvement: None   Does Patient Have a Court Appointed Legal Guardian? No data recorded Name and Contact of Legal Guardian: No data recorded If Minor and Not Living with Parent(s), Who has Custody? No data recorded Is CPS involved or ever been involved? In the Past (Per chart.)  Is APS involved or ever been involved? No data recorded  Patient Determined To Be At Risk for Harm To Self or Others Based on Review of Patient Reported Information or Presenting Complaint? No data recorded Method: No Plan  Availability of Means: No access or NA  Intent: Vague intent or NA  Notification Required: No need or identified person  Additional Information for Danger to Others Potential: No data recorded Additional Comments for Danger to Others Potential: The patient notes no current thoughts of harming others  Are There Guns or Other Weapons in Your Home? No  Types of Guns/Weapons: No data recorded Are These Weapons Safely Secured?                            No data recorded Who Could Verify You Are Able To Have These Secured: No data recorded Do You Have any Outstanding Charges, Pending Court Dates, Parole/Probation? No data recorded Contacted To Inform of Risk of Harm To Self or Others: No data recorded  Location of Assessment: [facility]   Does Patient Present under Involuntary Commitment?  No  IVC Papers Initial File Date: No data recorded  [place] of Residence: [place]   Patient Currently Receiving the Following Services: Medication Management   Determination of Need: Emergent (2 hours)   Options For Referral: Inpatient Hospitalization     CCA Biopsychosocial  Intake/Chief Complaint:     Mental Health Symptoms Depression:     Mania:     Anxiety:      Psychosis:     Trauma:     Obsessions:     Compulsions:     Inattention:     Hyperactivity/Impulsivity:     Oppositional/Defiant Behaviors:     Emotional Irregularity:     Other Mood/Personality Symptoms:      Mental Status Exam Appearance and self-care  Stature:  Stature:  (UTA)  Weight:  Weight:  (UTA)  Clothing:  Clothing:  (UTA)  Grooming:  Grooming:  (UTA)  Cosmetic use:  Cosmetic Use:  (UTA)  Posture/gait:  Posture/Gait:  (UTA)  Motor activity:  Motor Activity: Agitated  Sensorium  Attention:  Attention: Confused, Inattentive  Concentration:  Concentration: Preoccupied  Orientation:  Orientation:  (Pt not oriented.)  Recall/memory:  Recall/Memory: Defective in Recent, Defective in Immediate  Affect  and Mood  Affect:  Affect: Labile  Mood:  Mood: Other (Comment) (Labile.)  Relating  Eye contact:  Eye Contact: Avoided  Facial expression:  Facial Expression: Angry  Attitude toward examiner:  Attitude Toward Examiner: Guarded, Hostile  Thought and Language  Speech flow: Speech Flow: Soft, Flight of Ideas  Thought content:  Thought Content:  Civil engineer, contracting of ideas.)  Preoccupation:  Preoccupations:  (UTA)  Hallucinations:  Hallucinations:  (UTA)  Organization:     Transport planner of Knowledge:  Fund of Knowledge: Poor  Intelligence:     Abstraction:  Abstraction:  Special educational needs teacher)  Judgement:  Judgement: Impaired  Reality Testing:  Reality Testing:  (UTA)  Insight:  Insight: Other (Comment) (UTA)  Decision Making:  Decision Making:  (UTA)  Social Functioning  Social Maturity:  Social  Maturity:  Special educational needs teacher)  Social Judgement:  Social Judgement:  (UTA)  Stress  Stressors:  Stressors:  (UTA)  Coping Ability:  Coping Ability:  Special educational needs teacher)  Skill Deficits:  Skill Deficits:  Special educational needs teacher)  Supports:  Supports:  Special educational needs teacher)     Religion:    Leisure/Recreation:    Exercise/Diet:     CCA Employment/Education  Employment/Work Situation:    Education:     CCA Family/Childhood History  Family and Relationship History:    Childhood History:     Child/Adolescent Assessment:     CCA Substance Use  Alcohol/Drug Use: Alcohol / Drug Use Pain Medications: See MAR Prescriptions: See MAR Over the Counter: See MAR History of alcohol / drug use?: Yes Substance #1 Name of Substance 1: Cigarettes. (Per son) 1 - Age of First Use: UTA 1 - Amount (size/oz): Per son, pt smokes a pack and a half of cigarettes, daily. 1 - Frequency: Daily. 1 - Duration: Ongoing. 1 - Last Use / Amount: Per son, daily.  ASAM's:  Six Dimensions of Multidimensional Assessment  Dimension 1:  Acute Intoxication and/or Withdrawal Potential:      Dimension 2:  Biomedical Conditions and Complications:      Dimension 3:  Emotional, Behavioral, or Cognitive Conditions and Complications:     Dimension 4:  Readiness to Change:     Dimension 5:  Relapse, Continued use, or Continued Problem Potential:     Dimension 6:  Recovery/Living Environment:     ASAM Severity Score:    ASAM Recommended Level of Treatment:     Substance use Disorder (SUD)  Recommendations for Services/Supports/Treatments:  DSM5 Diagnoses: Patient Active Problem List   Diagnosis Date Noted  . Weight loss 06/15/2019  . Yeast infection 06/11/2019  . Lithium toxicity 06/03/2019  . Warts 02/19/2019  . Bipolar I disorder, most recent episode (or current) manic ([place]) 09/14/2018  . Schizoaffective disorder, bipolar type ([place]) 08/26/2017  . Immunity status testing 04/17/2017  . Nuclear sclerosis of both eyes 04/02/2016  . 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 Encounter for health education 10/24/2014  . IIH (idiopathic intracranial hypertension) 08/12/2014  . Seasonal allergies 02/23/2014  . Daytime hypersomnolence 12/09/2012  . Insomnia 11/06/2012  . COPD (chronic obstructive pulmonary disease) ([place]) 10/20/2012  . GERD (gastroesophageal reflux disease) 10/15/2012  . Preventative health care 10/15/2012  . Pulmonary hypertension ([place]) 05/02/2012  . Diastolic heart failure ([place]) 02/07/2012  . IDA (iron deficiency anemia) 11/06/2011  . Nausea & vomiting 07/06/2011  . Fatigue 05/17/2011  . Arteriosclerotic cardiovascular disease (ASCVD)   . TOBACCO ABUSE 09/27/2009  .  Back pain 12/13/2008  . Hyperlipidemia 07/22/2007  . Obesity 07/22/2007  . Bipolar disorder, current episode hypomanic ([place]) 07/22/2007  . Resistant hypertension 07/22/2007  . Diabetes mellitus type 2, controlled ([place]) 09/28/2002     Referrals to Alternative Service(s): Referred to Alternative Service(s):   Place:   Date:   Time:    Referred to Alternative Service(s):   Place:   Date:   Time:    Referred to Alternative Service(s):   Place:   Date:   Time:    Referred to Alternative Service(s):   Place:   Date:   Time:     Vertell Novak  Comprehensive Clinical Assessment (CCA) Screening, Triage and Referral Note  10/10/2019 [name] [id number]  Visit Diagnosis: No diagnosis found.  Patient Reported Information How did you hear about [name]? Other (Comment) (Per chart, pt's mother called pt's psychiatrist office to discus her behaviors, Rachel Lull, [name] recommended the pt come to the ED or [facility] for an evaluation.)   Referral name: [name].   Referral phone number: No data recorded Whom do you see for routine medical problems? No data recorded  Practice/Facility Name: No data  recorded  Practice/Facility Phone Number: No data recorded  Name of Contact: No data recorded  Contact Number: No data recorded  Contact Fax Number: No data recorded  Prescriber Name: No data recorded  Prescriber Address (if known): No data recorded What Is the Reason for Your Visit/Call Today? Pt has AMS.  How Long Has This Been Causing You Problems? > than 6 months  Have You Recently Been in Any Inpatient Treatment (Hospital/Detox/Crisis Center/28-Day Program)? No data recorded  Name/Location of Program/Hospital:No data recorded  How Long Were You There? No data recorded  When Were You Discharged? No data recorded Have You Ever Received Services From [facility] Before? No data recorded  Who Do You See at [facility]? No data recorded Have You Recently Had Any Thoughts About Hurting Yourself? No (Pt denies.)   Are You Planning to Commit Suicide/Harm Yourself At This time?  No data recorded Have you Recently Had Thoughts About [place]? No data recorded  Explanation: No data recorded Have You Used Any Alcohol or Drugs in the Past 24 Hours? No data recorded  How Long Ago Did You Use Drugs or Alcohol?  No data recorded  What Did You Use and How Much? No data recorded What Do You Feel Would Help You the Most Today? No data recorded Do You Currently Have a Therapist/Psychiatrist? Yes   Name of Therapist/Psychiatrist: [name]   Have You Been Recently Discharged From Any Office Practice or Programs? No data recorded  Explanation of Discharge From Practice/Program:  No data recorded    CCA Screening Triage Referral Assessment Type of Contact: Tele-Assessment   Is this Initial or Reassessment? Initial Assessment   Date Telepsych consult ordered in [facility]:  10/10/19   Time Telepsych consult ordered in [facility]:  0028  Patient Reported Information Reviewed? No data recorded  Patient Left Without Being Seen? No data recorded  Reason for Not Completing Assessment: No  data recorded Collateral Involvement: None  Does Patient Have a Court Appointed Legal Guardian? No data recorded  Name and Contact of Legal Guardian:  No data recorded If Minor and Not Living with Parent(s), Who has Custody? No data recorded Is CPS involved or ever been involved? In the Past (Per chart.)  Is APS involved or ever been involved? No data recorded Patient  Determined To Be At Risk for Harm To Self or Others Based on Review of Patient Reported Information or Presenting Complaint? No data recorded  Method: No Plan   Availability of Means: No access or NA   Intent: Vague intent or NA   Notification Required: No need or identified person   Additional Information for Danger to Others Potential:  No data recorded  Additional Comments for Danger to Others Potential:  The patient notes no current thoughts of harming others   Are There Guns or Other Weapons in Your Home?  No    Types of Guns/Weapons: No data recorded   Are These Weapons Safely Secured?                              No data recorded   Who Could Verify You Are Able To Have These Secured:    No data recorded Do You Have any Outstanding Charges, Pending Court Dates, Parole/Probation? No data recorded Contacted To Inform of Risk of Harm To Self or Others: No data recorded Location of [facility]: [facility]  Does Patient Present under Involuntary Commitment? No   IVC Papers Initial File Date: No data recorded  [place] of Residence: [place]  Patient Currently Receiving the Following Services: Medication Management   Determination of Need: Emergent (2 hours)   Options For Referral: Inpatient Hospitalization  Disposition: Rachel Romp, NP recommends inpatient treatment. Per Rachel Chute, RN no appropriate beds available. Disposition discussed with Rachel Cos, RN. RN to discuss with EDP/PA.   Vertell Novak, [place], [place], [facility], [facility] Triage Specialist [phone]

## 2019-10-10 NOTE — ED Notes (Signed)
She has just been seen by our psych. Team. She is awake and in no distress.

## 2019-10-10 NOTE — ED Provider Notes (Signed)
Attestation: Medical screening examination/treatment/procedure(s) were conducted as a shared visit with non-physician practitioner(s) and myself.  I personally evaluated the patient during the encounter.   Briefly, the patient is a 54 y.o. female with h/o schizoaffective disorder, bipolar 1 disorder, T2DM, anemia, alcohol abuse, hypertension, and hyperlipidemia who presents to the emergency department with her son for psychiatric evaluation  Vitals:   10/09/19 2223  BP: (!) 137/93  Pulse: 77  Resp: 16  Temp: 99.1 F (37.3 C)  SpO2: 99%    CONSTITUTIONAL: Nontoxic-appearing, NAD NEURO:  Alert and oriented x 3, no focal deficits EYES:  pupils equal and reactive ENT/NECK:  trachea midline, no JVD CARDIO: Regular rate, regular rhythm, well-perfused PULM: None labored breathing GI/GU:  Abdomin non-distended MSK/SPINE:  No gross deformities, no edema SKIN:  no rash, atraumatic PSYCH: Agitated, tangential speech, responsive to internal stimuli.   EKG Interpretation  Date/Time:    Ventricular Rate:    PR Interval:    QRS Duration:   QT Interval:    QTC Calculation:   R Axis:     Text Interpretation:         Required IVC. Labs reassuring and medically cleared. Evaluated by behavioral health and recommended for inpatient     [place]ston [place], Grayce Sessions, MD 10/10/19 0345

## 2019-10-10 NOTE — ED Notes (Signed)
I tried to perform EKG on Pt per Dr's order, Pt refused EKG, and stated "go suck your best friend's dick." Dr has been notified.

## 2019-10-10 NOTE — ED Notes (Signed)
Patient walked out of ED, found in lobby Requesting a smoke break.

## 2019-10-11 DIAGNOSIS — F250 Schizoaffective disorder, bipolar type: Secondary | ICD-10-CM | POA: Diagnosis not present

## 2019-10-11 LAB — CBG MONITORING, ED: Glucose-Capillary: 197 mg/dL — ABNORMAL HIGH (ref 70–99)

## 2019-10-11 MED ORDER — PALIPERIDONE PALMITATE ER 234 MG/1.5ML IM SUSY: 234.0000 mg | PREFILLED_SYRINGE | Freq: Once | INTRAMUSCULAR | Status: DC

## 2019-10-11 MED ORDER — STERILE WATER FOR INJECTION IJ SOLN
INTRAMUSCULAR | Status: AC
  Filled 2019-10-11: qty 10

## 2019-10-11 MED ORDER — ZIPRASIDONE MESYLATE 20 MG IM SOLR
20.0000 mg | Freq: Once | INTRAMUSCULAR | Status: AC
  Administered 2019-10-11: 20 mg via INTRAMUSCULAR
  Filled 2019-10-11: qty 20

## 2019-10-11 MED ORDER — PALIPERIDONE ER 6 MG PO TB24
6.0000 mg | ORAL_TABLET | Freq: Every day | ORAL | Status: DC
  Administered 2019-10-11: 6 mg via ORAL
  Filled 2019-10-11 (×2): qty 1

## 2019-10-11 MED ORDER — PALIPERIDONE PALMITATE ER 234 MG/1.5ML IM SUSY
234.0000 mg | PREFILLED_SYRINGE | Freq: Once | INTRAMUSCULAR | Status: DC
  Filled 2019-10-11: qty 1.5

## 2019-10-11 MED ORDER — LORAZEPAM 1 MG PO TABS: 1.0000 mg | ORAL_TABLET | Freq: Once | ORAL | Status: DC

## 2019-10-11 NOTE — BH Assessment (Signed)
Patient per [name] meets inpatient criteria. Patient referred to the following hospitals for consideration of a bed:  [facility]  [place]  [facility]

## 2019-10-11 NOTE — ED Provider Notes (Signed)
Emergency Medicine Observation Re-evaluation Note  [name] is a 54 y.o. female, seen on rounds today.  Pt initially presented to the ED for complaints of Medical Clearance Currently, the patient is actively trying to leave the emergency department.  The sitter was able to get her to return to the bed area, in the hallway.  Patient has been aggressively verbal, stating "I will Fuck you up."  She also made reference to "Jesus.".  Physical Exam  BP 131/87 (BP Location: Right Arm)   Pulse 95   Temp 98.9 F (37.2 C) (Oral)   Resp 18   Ht 5\' 4"  (1.626 m)   Wt 71 kg   SpO2 100%   BMI 26.87 kg/m  Physical Exam Aggressive, delusional. ED Course / MDM  EKG:EKG Interpretation  Date/Time:  Saturday October 10 2019 08:00:27 EDT Ventricular Rate:  79 PR Interval:    QRS Duration: 78 QT Interval:  401 QTC Calculation: 460 R Axis:   63 Text Interpretation: Sinus rhythm Borderline short PR interval Right atrial enlargement Minimal ST depression, inferior leads since last tracing no significant change Confirmed by Daleen Bo [phone]) on 10/10/2019 8:34:57 AM    I have reviewed the labs performed to date as well as medications administered while in observation.  Recent changes in the last 24 hours include persistent intermittent agitation, seen by TTS and plans for hospitalization, under IVC.  Patient has received a injection for long-acting antipsychotic medication, at 1:30 PM today. Plan  Current plan is for placement in a psychiatric facility.  We will attempt to de-escalate and medicate as needed. Patient is under full IVC at this time.   Daleen Bo, MD 10/11/19 [phone]

## 2019-10-11 NOTE — ED Provider Notes (Addendum)
Emergency Medicine Observation Re-evaluation Note  [name] is a 54 y.o. female, seen on rounds today.  Pt initially presented to the ED for complaints of Medical Clearance Currently, the patient is agitated, yelling, threatening staff.   Physical Exam  BP 114/76 (BP Location: Right Arm)   Pulse 83   Temp 97.9 F (36.6 C) (Oral)   Resp 16   Ht 5\' 4"  (1.626 m)   Wt 71 kg   SpO2 100%   BMI 26.87 kg/m  Physical Exam Vitals and nursing note reviewed.  Constitutional:      General: She is not in acute distress.    Appearance: She is well-developed.  HENT:     Head: Normocephalic and atraumatic.  Eyes:     General:        Right eye: No discharge.        Left eye: No discharge.     Conjunctiva/sclera: Conjunctivae normal.  Pulmonary:     Effort: Pulmonary effort is normal.  Neurological:     Mental Status: She is alert.     Comments: Clear speech.   Psychiatric:        Behavior: Behavior is agitated and aggressive.    ED Course / MDM  EKG:EKG Interpretation  Date/Time:  Saturday October 10 2019 08:00:27 EDT Ventricular Rate:  79 PR Interval:    QRS Duration: 78 QT Interval:  401 QTC Calculation: 460 R Axis:   63 Text Interpretation: Sinus rhythm Borderline short PR interval Right atrial enlargement Minimal ST depression, inferior leads since last tracing no significant change Confirmed by Daleen Bo [phone]) on 10/10/2019 8:34:57 AM    I have reviewed the labs performed to date as well as medications administered while in observation.    Plan  Current plan is for inpatient treatment.  Patient is under full IVC at this time. We have made attempts to redirect patient multiple times, however she continues to be aggressive, agitated, and is threatening staff with bodily harm, will administer IM Geodon, last dose was last night at 3 AM.  CRITICAL CARE Performed by: Kennith Maes   Total critical care time: 30 minutes  Critical care time was exclusive  of separately billable procedures and treating other patients.  Critical care was necessary to treat or prevent imminent or life-threatening deterioration.  Critical care was time spent personally by me on the following activities: development of treatment plan with patient and/or surrogate as well as nursing, discussions with consultants, evaluation of patient's response to treatment, examination of patient, obtaining history from patient or surrogate, ordering and performing treatments and interventions, ordering and review of laboratory studies, ordering and review of radiographic studies, pulse oximetry and re-evaluation of patient's condition.    Amaryllis Dyke, PA-C 10/11/19 0153    Amaryllis Dyke, PA-C 10/11/19 0154    Ward, Delice Bison, DO 10/11/19 [phone]

## 2019-10-11 NOTE — ED Notes (Signed)
Patient continually leaving ED. Walking out of EMS bay. Security and staff able to get patient back in building with verbal de escalation.

## 2019-10-11 NOTE — ED Notes (Addendum)
Patient provided with ham sandwich and ginger ale. Patient cooperative at this time. Took medications. Engaging in appropriate conversation with staff. Patient is asking for cigarette intermittently, staff educating on NO smoking policy.

## 2019-10-11 NOTE — ED Notes (Addendum)
Patient try to runway and try to hit this sitter but did not hit the sitter

## 2019-10-11 NOTE — Consult Note (Addendum)
[facility] Consult   Reason for Consult: Anxiety and agitation and depression Referring Physician: EDP Patient Identification: [name] MRN:  [id number] Principal Diagnosis: Schizoaffective disorder, bipolar type ([place]) Diagnosis:  Principal Problem:   Schizoaffective disorder, bipolar type ([place])  Total Time spent with patient: 45 minutes  Subjective:   [name] is a 54 y.o. female patient admitted with mood instability.  Patient seen and evaluated in person by this provider.  She came to the emergency department 2 nights ago for what she reports for fever.  At that time she was very agitated with cursing and anxiety.  She was given as needed medications and cleared quickly.  She still is delusional with no insight into her illness.  She did give permission for this provider to speak with her son who is in the process of trying to get guardianship as she is continuing to not take medications and decompensating.  The client lives with her aging parents who are not able to monitor her appropriately.  She was recently at [facility] and suffered from lithium toxicity.  Due to her noncompliance, [name] was started and tolerated well.  [name] sustain a plan for tomorrow and should transfer today to [facility] H inpatient unit.  HPI per TTS:  Pt presents voluntary and accompanied by her son [name]) to [facility]. Clinician asked the pt her name. Pt responded in a soft voice clinician asked her to repeat herself. Pt reported, "I don't know why I came here I'm okay." Clinician asked the pt, "what brought you to the hospital?" Clinician observed the pt not making eye contact and speaking very soft. Clinician repeated the question. Pt responded softly. Pt's son reported, "the pt said, thought she was running a fever." Pt's son reported, his mother is not herself, she has been off her medication for a year and a half, she is not taking medications have not caught up with her (in  her system). Pt's son reported, the pt told him, "we should kill them all." Clinician observed the pt speaking low then raising her voice and cursing at staff ("who the fuck, you bastards, fuck you up"), during the assessment. Clinician asked the pt if she had recent or previous HI. Pt responded softly clinician asked the pt to repeat her response. Pt's son reported, his mother said, "she's not a human, I want to fuck everybody up." Clinician asked the pt if she's experiencing and AVH. Pt reported, "you know what I hear, I'll fuck your motherfucking asses up."   Per Rachel Murray, LPN note on 46/56/8127 at [place]; "Writer received message from pt mother, [name], regarding pt medication and possible changes. Writer spoke with mother this morning and she stated that while she was in [place] pt brother was to give pt her medication, which he did however she evidently was cheeking all meds and now is decompensating. Mother states pt keeps hysterically crying and while she denies s.i., avh. Mother fears for pt safety due to past bx. Med ed reinforced. Writer encouraged mother to take pt to [facility] or [place] if needed. Please review."   Past Psychiatric History: schizoaffective disorder, bipolar type  Risk to Self:  yes Risk to Others:  none Prior Inpatient Therapy:  yes Prior Outpatient Therapy:  DR Adele Schilder  Past Medical History:  Past Medical History:  Diagnosis Date  . Alcohol abuse   . Anemia, iron deficiency   . Arteriosclerotic cardiovascular disease (ASCVD)    Minimal  at cath in [facility].stress nuclear study in 8/08 with nl EF; neg stress echo in 2010  . Community acquired pneumonia 01/03/10, 05/2010, 04/2012   2011; with pleural effusion-[facility] acute resp failure; intubated in Jan 2014 (HMPV pneumonia)  . Depression   . Diabetes mellitus, type 2 ([place]) 2000   Onset in 2000; no insulin  . Diarrhea 10/14/2013   Started 10/10/13, improved with  Imodium.   . Diastolic dysfunction    grade 2 per echo 2011  . Dysphagia   . Gastroesophageal reflux disease    Schatzki's ring  . History of alcohol abuse 07/22/2007   Qualifier: Diagnosis of  By: Lenn Cal    . Hyperlipidemia   . Hypertension `   during treatment with Geodon  . Hypokalemia 12/25/2013  . Left knee pain 08/25/2014  . Obesity   . Oral candidiasis 05/16/2017  . PTSD (post-traumatic stress disorder)   . Pulmonary hypertension ([place]) 05/02/2012   Patient needs repeat echo in 06/2012   . Schizoaffective disorder    requiring multiple psychiatric admissions  . Viral URI 05/12/2013  . Viral wart on finger 10/08/2016  . Vision changes 08/12/2014    Past Surgical History:  Procedure Laterality Date  . COLONOSCOPY  01/2006   internal hemorrhoids  . COLONOSCOPY  01/10/2012   Dr. Rourk:Single anal canal hemorrhoidal tag likely source of  trivial hematochezia; right-sided colonic diverticulosis  . DILATION AND CURETTAGE, DIAGNOSTIC / THERAPEUTIC  1992  . ESOPHAGEAL DILATION N/A 08/18/2015   Procedure: ESOPHAGEAL DILATION;  Surgeon: Daneil Dolin, MD;  Location[facility];  Service: Endoscopy;  Laterality: N/A;  . ESOPHAGOGASTRODUODENOSCOPY  09/16/08   Dr. Trevor Iha hiatal hernia/excoriations involving the cardia and mucosa consistent with trauma, antral erosions  of linear petechiae ? gastritis versus early gastric antral vascular  ectasia.Marland Kitchen biopsy showed reactive gastropathy. No H. pylori.  . ESOPHAGOGASTRODUODENOSCOPY  09/2007   Dr. Evalee Mutton ring, dilated to 58 French Maloney dilator, small hiatal hernia, antral erosions, biopsies reactive gastropathy.  . ESOPHAGOGASTRODUODENOSCOPY (EGD) WITH PROPOFOL N/A 12/17/2013   PXT:GGYIRSW antral erosions and petechiae. Small hiatal hernia. No endoscopic explanation for patient's symptoms  . ESOPHAGOGASTRODUODENOSCOPY (EGD) WITH PROPOFOL N/A 08/18/2015   Dr. Gala Romney: normal exam, s/p esophageal dilation  . ESOPHAGOGASTRODUODENOSCOPY  (EGD) WITH PROPOFOL N/A 06/18/2019   Procedure: ESOPHAGOGASTRODUODENOSCOPY (EGD) WITH PROPOFOL;  Surgeon: Daneil Dolin, MD;  Location: [facility];  Service: Endoscopy;  Laterality: N/A;  2:30pm - office moved to 10:15  . SAVORY DILATION  07/17/2011   [facility] sedation-->distal esophageal stricture s/p dilation, chronic gastritis, multiple ulcers in stomach. no h.pylori   Family History:  Family History  Problem Relation Age of Onset  . Hypertension Mother   . Stroke Father        deceased at age 8  . Colon cancer Other   . Heart disease Sister   . Diabetes Other   . High Cholesterol Other   . Arthritis Other   . Anesthesia problems Neg Hx   . Hypotension Neg Hx   . Malignant hyperthermia Neg Hx   . Pseudochol deficiency Neg Hx    Family Psychiatric  History: none Social History:  Social History   Substance and Sexual Activity  Alcohol Use No  . Alcohol/week: 0.0 standard drinks   Comment: hx of ETOH abuse     Social History   Substance and Sexual Activity  Drug Use No    Social History   Socioeconomic History  . Marital status:  Single    Spouse name: Not on file  . Number of children: 2  . Years of education: some colge  . Highest education level: Not on file  Occupational History  . Occupation: Disability  . Occupation: UNEMPLOYED    Employer: UNEMPLOYED  Tobacco Use  . Smoking status: Current Some Day Smoker    Packs/day: 1.00    Years: 30.00    Pack years: 30.00    Types: Cigarettes    Start date: 05/18/2012  . Smokeless tobacco: Never Used  . Tobacco comment: 1 PPD  Vaping Use  . Vaping Use: Never used  Substance and Sexual Activity  . Alcohol use: No    Alcohol/week: 0.0 standard drinks    Comment: hx of ETOH abuse  . Drug use: No  . Sexual activity: Not Currently    Partners: Male    Birth control/protection: None  Other Topics Concern  . 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 Difficulty of Paying Living Expenses:   Food Insecurity:   . Worried About Charity fundraiser in the Last Year:   . Arboriculturist in the Last Year:   Transportation Needs: No Transportation Needs  . Lack of Transportation (Medical): No  . Lack of Transportation (Non-Medical): No  Physical Activity:   . Days of Exercise per Week:   . Minutes of Exercise per Session:   Stress:   . Feeling of Stress :   Social Connections: Unknown  . Frequency of Communication with Friends and Family: More than three times a week  . Frequency of Social Gatherings with Friends and Family: Three times a week  . Attends Religious Services: Not on file  . Active Member of Clubs or Organizations: Not on file  . Attends Archivist Meetings: Not on file  . Marital Status: Not on file   Additional Social History:    Allergies:   Allergies  Allergen Reactions  . Cephalexin     Trouble breathing, felt like she had bumps in her throat.  . Metronidazole Shortness Of Breath and Swelling  . Orange Itching  . Orange [employer] and Other (See Comments)  . Other Shortness Of Breath and Itching    Takes Benadryl before eating shrimp.  . Shrimp [Shellfish Allergy] Shortness Of Breath and Itching    Takes Benadryl before eating shrimp.  Marland Kitchen Penicillins Hives, Swelling and Other (See Comments)    Has patient had a PCN reaction causing immediate rash, facial/tongue/throat swelling, SOB or lightheadedness with hypotension: Yes Has patient had a PCN reaction causing severe rash involving mucus membranes or skin necrosis: No Has patient had a PCN reaction that required hospitalization No Has patient had a PCN reaction occurring  within the last 10 years: Yes If all of the above answers are "NO", then may proceed with Cephalosporin use.    Fever as well  . Sulfonamide Derivatives Hives    fever  . Glipizide Other (See Comments)    psychosis  . Ace Inhibitors Cough    06/2016  . Sulfamethoxazole Rash  . Sulfamethoxazole-Trimethoprim Rash  . Trimethoprim Other (See Comments) and Rash  Labs:  Results for orders placed or performed during the hospital encounter of 10/09/19 (from the past 48 hour(s))  CBC     Status: None   Collection Time: 10/10/19 12:23 AM  Result Value Ref Range   WBC 8.7 4.0 - 10.5 K/uL   RBC 4.99 3.87 - 5.11 MIL/uL   Hemoglobin 14.6 12.0 - 15.0 g/dL   HCT 43.7 36 - 46 %   MCV 87.6 80.0 - 100.0 fL   MCH 29.3 26.0 - 34.0 pg   MCHC 33.4 30.0 - 36.0 g/dL   RDW 14.8 11.5 - 15.5 %   Platelets 222 150 - 400 K/uL   nRBC 0.0 0.0 - 0.2 %    Comment: Performed at [facility], [place] [address], [place], [place] [address]  Comprehensive metabolic panel     Status: Abnormal   Collection Time: 10/10/19 12:23 AM  Result Value Ref Range   Sodium 137 135 - 145 mmol/L   Potassium 4.0 3.5 - 5.1 mmol/L   Chloride 106 98 - 111 mmol/L   CO2 19 (L) 22 - 32 mmol/L   Glucose, Bld 179 (H) 70 - 99 mg/dL    Comment: Glucose reference range applies only to samples taken after fasting for at least 8 hours.   BUN 20 6 - 20 mg/dL   Creatinine, Ser 1.25 (H) 0.44 - 1.00 mg/dL   Calcium 10.0 8.9 - 10.3 mg/dL   Total Protein 7.5 6.5 - 8.1 g/dL   Albumin 4.4 3.5 - 5.0 g/dL   AST 12 (L) 15 - 41 U/L   ALT 11 0 - 44 U/L   Alkaline Phosphatase 66 38 - 126 U/L   Total Bilirubin 0.5 0.3 - 1.2 mg/dL   GFR calc non Af Amer 50 (L) >60 mL/min   GFR calc Af Amer 58 (L) >60 mL/min   Anion gap 12 5 - 15    Comment: Performed at [facility], [place] [address] [place], [place] [address]  Ethanol     Status: None   Collection Time: 10/10/19 12:23 AM  Result Value Ref Range   Alcohol,  Ethyl (B) <10 <10 mg/dL    Comment: (NOTE) Lowest detectable limit for serum alcohol is 10 mg/dL.  For medical purposes only. Performed at [facility], [place] [address], [place], [place] [address]   Rapid urine drug screen (hospital performed)     Status: None   Collection Time: 10/10/19 12:23 AM  Result Value Ref Range   Opiates NONE DETECTED NONE DETECTED   Cocaine NONE DETECTED NONE DETECTED   Benzodiazepines NONE DETECTED NONE DETECTED   Amphetamines NONE DETECTED NONE DETECTED   Tetrahydrocannabinol NONE DETECTED NONE DETECTED   Barbiturates NONE DETECTED NONE DETECTED    Comment: (NOTE) DRUG SCREEN FOR MEDICAL PURPOSES ONLY.  IF CONFIRMATION IS NEEDED FOR ANY PURPOSE, NOTIFY LAB WITHIN 5 DAYS.  LOWEST DETECTABLE LIMITS FOR URINE DRUG SCREEN Drug Class                     Cutoff (ng/mL) Amphetamine and metabolites    1000 Barbiturate and metabolites    200 Benzodiazepine                 003 Tricyclics and metabolites     300 Opiates and metabolites        300 Cocaine and metabolites        300 THC  50 Performed at [facility], [place] [address] [place], [place] [address]   Salicylate level     Status: Abnormal   Collection Time: 10/10/19 12:23 AM  Result Value Ref Range   Salicylate Lvl <6.0 (L) 7.0 - 30.0 mg/dL    Comment: Performed at [facility], [place] [address] [place], [place] [address]  Acetaminophen level     Status: Abnormal   Collection Time: 10/10/19 12:23 AM  Result Value Ref Range   Acetaminophen (Tylenol), Serum <10 (L) 10 - 30 ug/mL    Comment: (NOTE) Therapeutic concentrations vary significantly. A range of 10-30 ug/mL  may be an effective concentration for many patients. However, some  are best treated at concentrations outside of this range. Acetaminophen concentrations >150 ug/mL at 4 hours after ingestion  and >50 ug/mL at 12 hours after ingestion are often  associated with  toxic reactions.  Performed at [facility], [place] [address] [place], [place] [address]   SARS Coronavirus 2 by RT PCR (hospital order, performed in [facility] hospital lab) Nasopharyngeal Nasopharyngeal Swab     Status: None   Collection Time: 10/10/19 12:28 AM   Specimen: Nasopharyngeal Swab  Result Value Ref Range   SARS Coronavirus 2 NEGATIVE NEGATIVE    Comment: (NOTE) SARS-CoV-2 target nucleic acids are NOT DETECTED.  The SARS-CoV-2 RNA is generally detectable in upper and lower respiratory specimens during the acute phase of infection. The lowest concentration of SARS-CoV-2 viral copies this assay can detect is 250 copies / mL. A negative result does not preclude SARS-CoV-2 infection and should not be used as the sole basis for treatment or other patient management decisions.  A negative result may occur with improper specimen collection / handling, submission of specimen other than nasopharyngeal swab, presence of viral mutation(s) within the areas targeted by this assay, and inadequate number of viral copies (<250 copies / mL). A negative result must be combined with clinical observations, patient history, and epidemiological information.  Fact Sheet for Patients:   StrictlyIdeas.no  Fact Sheet for Healthcare Providers: BankingDealers.co.za  This test is not yet approved or  cleared by the [place] FDA and has been authorized for detection and/or diagnosis of SARS-CoV-2 by FDA under an Emergency Use Authorization (EUA).  This EUA will remain in effect (meaning this test can be used) for the duration of the COVID-19 declaration under Section 564(b)(1) of the Act, 21 U.S.C. section 360bbb-3(b)(1), unless the authorization is terminated or revoked sooner.  Performed at [facility], [place] [address] [place], [place] [address]   I-Stat beta hCG blood, ED      Status: None   Collection Time: 10/10/19  1:04 AM  Result Value Ref Range   I-stat hCG, quantitative <5.0 <5 mIU/mL   Comment 3            Comment:   GEST. AGE      CONC.  (mIU/mL)   <=1 WEEK        5 - 50     2 WEEKS       50 - 500     3 WEEKS       100 - 10,000     4 WEEKS     1,000 - 30,000        FEMALE AND NON-PREGNANT FEMALE:     LESS THAN 5 mIU/mL   CBG monitoring, ED     Status: Abnormal   Collection Time: 10/11/19 11:59  AM  Result Value Ref Range   Glucose-Capillary 197 (H) 70 - 99 mg/dL    Comment: Glucose reference range applies only to samples taken after fasting for at least 8 hours.   *Note: Due to a large number of results and/or encounters for the requested time period, some results have not been displayed. A complete set of results can be found in Results Review.    Current Facility-Administered Medications  Medication Dose Route Frequency Provider Last Rate Last Admin  . acetaminophen (TYLENOL) tablet 650 mg  650 mg Oral Q8H PRN Petrucelli, Samantha R, PA-C      . albuterol (VENTOLIN HFA) 108 (90 Base) MCG/ACT inhaler 1-2 puff  1-2 puff Inhalation Q6H PRN Petrucelli, Samantha R, PA-C      . alum & mag hydroxide-simeth (MAALOX/MYLANTA) 200-200-20 MG/5ML suspension 30 mL  30 mL Oral Q6H PRN Petrucelli, Samantha R, PA-C      . amLODipine (NORVASC) tablet 10 mg  10 mg Oral Daily Petrucelli, Samantha R, PA-C   10 mg at 10/11/19 1201  . ferrous sulfate tablet 325 mg  325 mg Oral QODAY Petrucelli, Samantha R, PA-C   325 mg at 10/11/19 1200  . insulin glargine (LANTUS) injection 10 Units  10 Units Subcutaneous Daily Petrucelli, Samantha R, PA-C   10 Units at 10/11/19 1204  . losartan (COZAAR) tablet 50 mg  50 mg Oral Daily Petrucelli, Samantha R, PA-C   50 mg at 10/11/19 1230  . metFORMIN (GLUCOPHAGE) tablet 1,000 mg  1,000 mg Oral BID WC Petrucelli, Samantha R, PA-C   1,000 mg at 10/11/19 1200  . metoprolol tartrate (LOPRESSOR) tablet 100 mg  100 mg Oral BID Petrucelli,  Samantha R, PA-C   100 mg at 10/11/19 1159  . mirtazapine (REMERON) tablet 15 mg  15 mg Oral QHS Petrucelli, Samantha R, PA-C      . nicotine (NICODERM CQ - dosed in mg/24 hours) patch 21 mg  21 mg Transdermal Daily Petrucelli, Samantha R, PA-C      . ondansetron (ZOFRAN) tablet 4 mg  4 mg Oral Q8H PRN Petrucelli, Samantha R, PA-C      . [START ON 10/12/2019] paliperidone (INVEGA SUSTENNA) injection 234 mg  234 mg Intramuscular Once Patrecia Pour, NP      . paliperidone (INVEGA) 24 hr tablet 6 mg  6 mg Oral Daily Patrecia Pour, NP   6 mg at 10/11/19 1201  . pantoprazole (PROTONIX) EC tablet 40 mg  40 mg Oral BID Petrucelli, Samantha R, PA-C   40 mg at 10/11/19 1159  . pravastatin (PRAVACHOL) tablet 40 mg  40 mg Oral q1800 Petrucelli, Samantha R, PA-C      . senna-docusate (Senokot-S) tablet 1 tablet  1 tablet Oral QHS PRN Petrucelli, Samantha R, PA-C      . sertraline (ZOLOFT) tablet 100 mg  100 mg Oral Daily Petrucelli, Samantha R, PA-C   100 mg at 10/11/19 1200  . spironolactone (ALDACTONE) tablet 50 mg  50 mg Oral Daily Petrucelli, Samantha R, PA-C   50 mg at 10/11/19 1230  . sterile water (preservative free) injection           . thiamine tablet 100 mg  100 mg Oral Daily Petrucelli, Samantha R, PA-C   100 mg at 10/11/19 1200   Current Outpatient Medications  Medication Sig Dispense Refill  . acetaminophen (TYLENOL) 325 MG tablet Take 650 mg by mouth every 6 (six) hours as needed for mild pain, moderate pain or headache.    Marland Kitchen  amLODipine (NORVASC) 10 MG tablet Take 1 tablet (10 mg total) by mouth daily. For high blood pressure (Patient taking differently: Take 10 mg by mouth at bedtime. ) 90 tablet 3  . benztropine (COGENTIN) 1 MG tablet Take 1/2 to one tab as needed for tremor (Patient taking differently: Take 1 mg by mouth at bedtime. ) 30 tablet 1  . insulin glargine (LANTUS) 100 UNIT/ML injection Inject 0.1 mLs (10 Units total) into the skin daily. (Patient taking differently: Inject 10  Units into the skin daily as needed. High blood sugar) 10 mL 11  . losartan (COZAAR) 50 MG tablet Take 1 tablet (50 mg total) by mouth daily. 90 tablet 1  . lovastatin (MEVACOR) 40 MG tablet Take 1 tablet (40 mg total) by mouth every evening. 90 tablet 0  . Magnesium 500 MG TABS Take 500 mg by mouth daily.    . metFORMIN (GLUCOPHAGE) 1000 MG tablet Take 1 tablet (1,000 mg total) by mouth 2 (two) times daily with a meal. 180 tablet 0  . metoprolol tartrate (LOPRESSOR) 100 MG tablet Take 1 tablet (100 mg total) by mouth 2 (two) times daily. 180 tablet 0  . mirtazapine (REMERON) 15 MG tablet Take 1 tablet (15 mg total) by mouth at bedtime. 30 tablet 1  . perphenazine (TRILAFON) 4 MG tablet Take 1 tablet (4 mg total) by mouth 2 (two) times daily. 60 tablet 1  . sertraline (ZOLOFT) 100 MG tablet Take 1 tablet (100 mg total) by mouth daily. (Patient taking differently: Take 50 mg by mouth in the morning and at bedtime. ) 30 tablet 1  . spironolactone (ALDACTONE) 50 MG tablet Take 1 tablet (50 mg total) by mouth daily. 90 tablet 3  . Thiamine HCl (VITAMIN B-1) 100 MG TABS Take 1 tablet (100 mg total) by mouth daily. 90 tablet 1  . Accu-Chek Softclix Lancets lancets Use as instructed 400 each 12  . albuterol (VENTOLIN HFA) 108 (90 Base) MCG/ACT inhaler Inhale 1-2 puffs into the lungs every 6 (six) hours as needed for wheezing or shortness of breath. 18 g 5  . ferrous sulfate 325 (65 FE) MG tablet Take 1 tablet (325 mg total) by mouth every other day. (May buy from over the counter): For anemia (Patient not taking: Reported on 10/10/2019) 15 tablet 2  . nicotine (NICODERM CQ - DOSED IN MG/24 HR) 7 mg/24hr patch Place 1 patch (7 mg total) onto the skin daily. (Patient not taking: Reported on 10/10/2019) 21 patch 0  . ondansetron (ZOFRAN) 4 MG tablet TAKE 1 TABLET(4 MG) BY MOUTH EVERY 4 HOURS AS NEEDED FOR NAUSEA OR VOMITING (Patient not taking: Reported on 10/10/2019) 60 tablet 2  . senna-docusate (SENOKOT-S)  8.6-50 MG tablet Take 1 tablet by mouth at bedtime as needed for mild constipation. (Patient not taking: Reported on 10/10/2019) 60 tablet 1    Musculoskeletal: Strength & Muscle Tone: within normal limits Gait & Station: normal Patient leans: N/A  Psychiatric Specialty Exam: Physical Exam Vitals and nursing note reviewed.  Constitutional:      Appearance: Normal appearance.  HENT:     Head: Normocephalic.     Nose: Nose normal.  Pulmonary:     Effort: Pulmonary effort is normal.  Musculoskeletal:     Cervical back: Normal range of motion.  Neurological:     General: No focal deficit present.     Mental Status: She is alert and oriented to person, place, and time.  Psychiatric:  Attention and Perception: Attention and perception normal.        Mood and Affect: Mood is anxious and depressed.        Speech: Speech normal.        Behavior: Behavior normal. Behavior is cooperative.        Thought Content: Thought content is paranoid and delusional.        Cognition and Memory: She exhibits impaired recent memory.        Judgment: Judgment normal.     Review of Systems  Psychiatric/Behavioral: Positive for dysphoric mood. The patient is nervous/anxious.   All other systems reviewed and are negative.   Blood pressure 131/87, pulse 95, temperature 98.9 F (37.2 C), temperature source Oral, resp. rate 18, height _0  (1.626 m), weight 71 kg, SpO2 100 %.Body mass index is 26.87 kg/m.  General Appearance: Casual  Eye Contact:  Fair  Speech:  Normal Rate  Volume:  Normal  Mood:  Anxious and Depressed  Affect:  Blunt  Thought Process:  Coherent and Descriptions of Associations: Intact  Orientation:  Full (Time, Place, and Person)  Thought Content:  Delusions and Paranoid Ideation  Suicidal Thoughts:  Yes.  without intent/plan  Homicidal Thoughts:  No  Memory:  Immediate;   Fair Recent;   Poor Remote;   Fair  Judgement:  Poor  Insight:  Lacking  Psychomotor Activity:   Normal  Concentration:  Concentration: Fair and Attention Span: Fair  Recall:  [employer] of Knowledge:  Fair  Language:  Good  Akathisia:  No  Handed:  Right  AIMS (if indicated):     Assets:  Housing Leisure Time Physical Health Resilience Social Support  ADL's:  Intact  Cognition:  Impaired,  Mild  Sleep:        Treatment Plan Summary: Daily contact with patient to assess and evaluate symptoms and progress in treatment, Medication management and Plan Schizoaffective disorder, bipolar type -Started Invega 3 mg and increase to 6 mg today with Invega sustenna injection plan for tomorrow -Admit to inpatient psychiatric hospital for stabilization  Depression: -Continue Zoloft 100 mg daily  Insomnia and depression: -Continue Remeron 15 mg daily at bedtime  Disposition: Recommend psychiatric Inpatient admission when medically cleared.  [place] Boga, NP 10/11/2019 1:20 PM

## 2019-10-11 NOTE — ED Notes (Signed)
Notified by Waylan Boga NP that patient has bed at [facility]. Writer contacted [facility] and was told no nurses were available and to call back. Writer contacted [employer] at [facility]. Gerald Stabs stated that she would call writer back in ten minutes.
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
## 2019-10-12 ENCOUNTER — Encounter (HOSPITAL_COMMUNITY): Payer: Self-pay | Admitting: Psychiatry

## 2019-10-12 ENCOUNTER — Other Ambulatory Visit: Payer: Self-pay

## 2019-10-12 ENCOUNTER — Inpatient Hospital Stay (HOSPITAL_COMMUNITY)
Admission: AD | Admit: 2019-10-12 | Discharge: 2019-10-16 | DRG: 885 | Disposition: A | Discharge status: Home / Self Care | Payer: Medicare Other | Source: Intra-hospital | Attending: Psychiatry | Admitting: Psychiatry

## 2019-10-12 DIAGNOSIS — D509 Iron deficiency anemia, unspecified: Secondary | ICD-10-CM | POA: Diagnosis present

## 2019-10-12 DIAGNOSIS — R45851 Suicidal ideations: Secondary | ICD-10-CM | POA: Diagnosis present

## 2019-10-12 DIAGNOSIS — G4700 Insomnia, unspecified: Secondary | ICD-10-CM | POA: Diagnosis present

## 2019-10-12 DIAGNOSIS — Z8249 Family history of ischemic heart disease and other diseases of the circulatory system: Secondary | ICD-10-CM | POA: Diagnosis not present

## 2019-10-12 DIAGNOSIS — Z794 Long term (current) use of insulin: Secondary | ICD-10-CM

## 2019-10-12 DIAGNOSIS — Z800 Family history of malignant neoplasm of digestive organs: Secondary | ICD-10-CM

## 2019-10-12 DIAGNOSIS — Z882 Allergy status to sulfonamides status: Secondary | ICD-10-CM

## 2019-10-12 DIAGNOSIS — E785 Hyperlipidemia, unspecified: Secondary | ICD-10-CM | POA: Diagnosis present

## 2019-10-12 DIAGNOSIS — Z91013 Allergy to seafood: Secondary | ICD-10-CM

## 2019-10-12 DIAGNOSIS — E569 Vitamin deficiency, unspecified: Secondary | ICD-10-CM | POA: Diagnosis present

## 2019-10-12 DIAGNOSIS — Z20822 Contact with and (suspected) exposure to covid-19: Secondary | ICD-10-CM | POA: Diagnosis present

## 2019-10-12 DIAGNOSIS — F17210 Nicotine dependence, cigarettes, uncomplicated: Secondary | ICD-10-CM | POA: Diagnosis present

## 2019-10-12 DIAGNOSIS — I10 Essential (primary) hypertension: Secondary | ICD-10-CM | POA: Diagnosis present

## 2019-10-12 DIAGNOSIS — I272 Pulmonary hypertension, unspecified: Secondary | ICD-10-CM | POA: Diagnosis present

## 2019-10-12 DIAGNOSIS — Z91018 Allergy to other foods: Secondary | ICD-10-CM

## 2019-10-12 DIAGNOSIS — K219 Gastro-esophageal reflux disease without esophagitis: Secondary | ICD-10-CM | POA: Diagnosis present

## 2019-10-12 DIAGNOSIS — Z888 Allergy status to other drugs, medicaments and biological substances status: Secondary | ICD-10-CM | POA: Diagnosis not present

## 2019-10-12 DIAGNOSIS — J449 Chronic obstructive pulmonary disease, unspecified: Secondary | ICD-10-CM | POA: Diagnosis present

## 2019-10-12 DIAGNOSIS — K5900 Constipation, unspecified: Secondary | ICD-10-CM | POA: Diagnosis present

## 2019-10-12 DIAGNOSIS — Z915 Personal history of self-harm: Secondary | ICD-10-CM

## 2019-10-12 DIAGNOSIS — Z79899 Other long term (current) drug therapy: Secondary | ICD-10-CM

## 2019-10-12 DIAGNOSIS — Z823 Family history of stroke: Secondary | ICD-10-CM

## 2019-10-12 DIAGNOSIS — Z560 Unemployment, unspecified: Secondary | ICD-10-CM

## 2019-10-12 DIAGNOSIS — E113291 Type 2 diabetes mellitus with mild nonproliferative diabetic retinopathy without macular edema, right eye: Secondary | ICD-10-CM | POA: Diagnosis present

## 2019-10-12 DIAGNOSIS — Z880 Allergy status to penicillin: Secondary | ICD-10-CM | POA: Diagnosis not present

## 2019-10-12 DIAGNOSIS — R45850 Homicidal ideations: Secondary | ICD-10-CM | POA: Diagnosis present

## 2019-10-12 DIAGNOSIS — Z833 Family history of diabetes mellitus: Secondary | ICD-10-CM

## 2019-10-12 DIAGNOSIS — F3110 Bipolar disorder, current episode manic without psychotic features, unspecified: Secondary | ICD-10-CM | POA: Diagnosis present

## 2019-10-12 DIAGNOSIS — I2510 Atherosclerotic heart disease of native coronary artery without angina pectoris: Secondary | ICD-10-CM | POA: Diagnosis present

## 2019-10-12 DIAGNOSIS — F250 Schizoaffective disorder, bipolar type: Principal | ICD-10-CM | POA: Diagnosis present

## 2019-10-12 DIAGNOSIS — F419 Anxiety disorder, unspecified: Secondary | ICD-10-CM

## 2019-10-12 LAB — CBG MONITORING, ED: Glucose-Capillary: 134 mg/dL — ABNORMAL HIGH (ref 70–99)

## 2019-10-12 LAB — GLUCOSE, CAPILLARY: Glucose-Capillary: 218 mg/dL — ABNORMAL HIGH (ref 70–99)

## 2019-10-12 MED ORDER — METOPROLOL TARTRATE 100 MG PO TABS
100.0000 mg | ORAL_TABLET | Freq: Two times a day (BID) | ORAL | Status: DC
  Administered 2019-10-12 – 2019-10-16 (×8): 100 mg via ORAL
  Filled 2019-10-12 (×7): qty 1
  Filled 2019-10-12: qty 4
  Filled 2019-10-12 (×3): qty 1

## 2019-10-12 MED ORDER — ALUM & MAG HYDROXIDE-SIMETH 200-200-20 MG/5ML PO SUSP: 30.0000 mL | ORAL | Status: DC | PRN

## 2019-10-12 MED ORDER — PALIPERIDONE PALMITATE ER 234 MG/1.5ML IM SUSY
234.0000 mg | PREFILLED_SYRINGE | Freq: Once | INTRAMUSCULAR | Status: AC
  Administered 2019-10-13: 234 mg via INTRAMUSCULAR
  Filled 2019-10-12: qty 1.5

## 2019-10-12 MED ORDER — SPIRONOLACTONE 50 MG PO TABS
50.0000 mg | ORAL_TABLET | Freq: Every day | ORAL | Status: DC
  Administered 2019-10-12 – 2019-10-16 (×5): 50 mg via ORAL
  Filled 2019-10-12 (×6): qty 1
  Filled 2019-10-12: qty 2

## 2019-10-12 MED ORDER — THIAMINE HCL 100 MG PO TABS
100.0000 mg | ORAL_TABLET | Freq: Every day | ORAL | Status: DC
  Administered 2019-10-12 – 2019-10-16 (×5): 100 mg via ORAL
  Filled 2019-10-12 (×7): qty 1

## 2019-10-12 MED ORDER — ALBUTEROL SULFATE HFA 108 (90 BASE) MCG/ACT IN AERS: 1.0000 | INHALATION_SPRAY | Freq: Four times a day (QID) | RESPIRATORY_TRACT | Status: DC | PRN

## 2019-10-12 MED ORDER — PRAVASTATIN SODIUM 40 MG PO TABS
40.0000 mg | ORAL_TABLET | Freq: Every day | ORAL | Status: DC
  Administered 2019-10-13 – 2019-10-15 (×3): 40 mg via ORAL
  Filled 2019-10-12 (×5): qty 1
  Filled 2019-10-12: qty 4
  Filled 2019-10-12: qty 1

## 2019-10-12 MED ORDER — LORAZEPAM 1 MG PO TABS
1.0000 mg | ORAL_TABLET | Freq: Once | ORAL | Status: AC
  Administered 2019-10-12: 1 mg via ORAL
  Filled 2019-10-12: qty 1

## 2019-10-12 MED ORDER — NICOTINE 21 MG/24HR TD PT24
21.0000 mg | MEDICATED_PATCH | Freq: Every day | TRANSDERMAL | Status: DC
  Filled 2019-10-12 (×7): qty 1

## 2019-10-12 MED ORDER — MAGNESIUM HYDROXIDE 400 MG/5ML PO SUSP: 30.0000 mL | Freq: Every day | ORAL | Status: DC | PRN

## 2019-10-12 MED ORDER — SERTRALINE HCL 100 MG PO TABS
100.0000 mg | ORAL_TABLET | Freq: Every day | ORAL | Status: DC
  Administered 2019-10-13 – 2019-10-16 (×4): 100 mg via ORAL
  Filled 2019-10-12 (×5): qty 1

## 2019-10-12 MED ORDER — METFORMIN HCL 500 MG PO TABS
1000.0000 mg | ORAL_TABLET | Freq: Two times a day (BID) | ORAL | Status: DC
  Administered 2019-10-12 – 2019-10-16 (×8): 1000 mg via ORAL
  Filled 2019-10-12 (×13): qty 2

## 2019-10-12 MED ORDER — SENNOSIDES-DOCUSATE SODIUM 8.6-50 MG PO TABS: 1.0000 | ORAL_TABLET | Freq: Every evening | ORAL | Status: DC | PRN

## 2019-10-12 MED ORDER — AMLODIPINE BESYLATE 10 MG PO TABS
10.0000 mg | ORAL_TABLET | Freq: Every day | ORAL | Status: DC
  Administered 2019-10-13 – 2019-10-16 (×4): 10 mg via ORAL
  Filled 2019-10-12 (×5): qty 1

## 2019-10-12 MED ORDER — MIRTAZAPINE 15 MG PO TABS
15.0000 mg | ORAL_TABLET | Freq: Every day | ORAL | Status: DC
  Administered 2019-10-13 – 2019-10-15 (×3): 15 mg via ORAL
  Filled 2019-10-12 (×5): qty 1

## 2019-10-12 MED ORDER — INSULIN GLARGINE 100 UNIT/ML ~~LOC~~ SOLN
10.0000 [IU] | Freq: Every day | SUBCUTANEOUS | Status: DC
  Administered 2019-10-12 – 2019-10-13 (×2): 10 [IU] via SUBCUTANEOUS

## 2019-10-12 MED ORDER — PALIPERIDONE ER 6 MG PO TB24
6.0000 mg | ORAL_TABLET | Freq: Every day | ORAL | Status: DC
  Administered 2019-10-12 – 2019-10-14 (×3): 6 mg via ORAL
  Filled 2019-10-12 (×6): qty 1

## 2019-10-12 MED ORDER — ACETAMINOPHEN 325 MG PO TABS
650.0000 mg | ORAL_TABLET | Freq: Four times a day (QID) | ORAL | Status: DC | PRN
  Filled 2019-10-12: qty 2

## 2019-10-12 MED ORDER — ONDANSETRON HCL 4 MG PO TABS: 4.0000 mg | ORAL_TABLET | Freq: Three times a day (TID) | ORAL | Status: DC | PRN

## 2019-10-12 MED ORDER — PANTOPRAZOLE SODIUM 40 MG PO TBEC
40.0000 mg | DELAYED_RELEASE_TABLET | Freq: Two times a day (BID) | ORAL | Status: DC
  Administered 2019-10-12 – 2019-10-16 (×8): 40 mg via ORAL
  Filled 2019-10-12 (×11): qty 1

## 2019-10-12 MED ORDER — FERROUS SULFATE 325 (65 FE) MG PO TABS
325.0000 mg | ORAL_TABLET | ORAL | Status: DC
  Administered 2019-10-14 – 2019-10-16 (×2): 325 mg via ORAL
  Filled 2019-10-12 (×3): qty 1

## 2019-10-12 MED ORDER — LOSARTAN POTASSIUM 50 MG PO TABS
50.0000 mg | ORAL_TABLET | Freq: Every day | ORAL | Status: DC
  Administered 2019-10-13 – 2019-10-16 (×4): 50 mg via ORAL
  Filled 2019-10-12 (×7): qty 1

## 2019-10-12 NOTE — ED Notes (Signed)
Per Gershon Mussel, Pt is awaiting [facility] bed placement and pt does meet IVC criteria

## 2019-10-12 NOTE — ED Notes (Signed)
Patient refused lunch tray.

## 2019-10-12 NOTE — Progress Notes (Signed)
Admission Note: Patient is a 54 year old female admitted to the unit for medication noncompliance and hallucinations.  Patient transferred from [facility] under IVC status.  Patient presents with anxious affect and paranoid behavior during assessment.  Appears to be responding to internal stimuli.  Patient observed looking around the room and whispering to herself.  Admission plan of care reviewed and consent signed.  Skin assessment and personal belongings completed. Skin is dry and intact.  No contraband found.  Patient oriented to the unit, staff and room.  Routine safety checks initiated.  Patient is safe on the unit.

## 2019-10-12 NOTE — Tx Team (Signed)
Initial Treatment Plan 10/12/2019 6:44 PM [name] [id number]    PATIENT STRESSORS: Financial difficulties Health problems Medication change or noncompliance   PATIENT STRENGTHS: Ability for insight Average or above average intelligence Supportive family/friends   PATIENT IDENTIFIED PROBLEMS: Psychosis  Medication Noncompliance  Anxiety                 DISCHARGE CRITERIA:  Ability to meet basic life and health needs Adequate post-discharge living arrangements  PRELIMINARY DISCHARGE PLAN: Attend aftercare/continuing care group Outpatient therapy Return to previous living arrangement  PATIENT/FAMILY INVOLVEMENT: This treatment plan has been presented to and reviewed with the patient, [name], and/or family member.  The patient and family have been given the opportunity to ask questions and make suggestions.  Vela Prose, RN 10/12/2019, 6:44 PM

## 2019-10-12 NOTE — BH Assessment (Addendum)
[place] Assessment Progress Note  Per Waylan Boga, DNP, this pt requires psychiatric hospitalization at this time.  Heather, RN has assigned pt to [facility]; [place] will call when they are ready to receive pt.  Pt presents under IVC initiated by EDP Addison Lank, MD, and IVC documents have been faxed to [facility].  Pt's nurse has been notified, and agrees to call report to [phone].  Pt is to be transported via Event organ[employer].   Jalene Mullet, [place] Coordinator [phone]

## 2019-10-12 NOTE — ED Provider Notes (Signed)
Emergency Medicine Observation Re-evaluation Note  [name] is a 54 y.o. female, seen on rounds today.  Pt initially presented to the ED for complaints of Medical Clearance Currently, the patient is resting comfortably..  Physical Exam  BP 123/79 (BP Location: Left Arm)   Pulse (!) 103   Temp 97.8 F (36.6 C) (Oral)   Resp 16   Ht 5\' 4"  (1.626 m)   Wt 71 kg   SpO2 100%   BMI 26.87 kg/m  Physical Exam  ED Course / MDM  EKG:EKG Interpretation  Date/Time:  Saturday October 10 2019 08:00:27 EDT Ventricular Rate:  79 PR Interval:    QRS Duration: 78 QT Interval:  401 QTC Calculation: 460 R Axis:   63 Text Interpretation: Sinus rhythm Borderline short PR interval Right atrial enlargement Minimal ST depression, inferior leads since last tracing no significant change Confirmed by Daleen Bo [phone]) on 10/10/2019 8:34:57 AM    I have reviewed the labs performed to date as well as medications administered while in observation.  Recent changes in the last 24 hours include increased agitation requiring chemical sedation.. Plan  Current plan is for inpatient bed search. Patient is under full IVC at this time.   Hayden Rasmussen, MD 10/12/19 [phone]

## 2019-10-12 NOTE — ED Notes (Signed)
Pt, paperwork and all belongings transported to [facility] with officer.

## 2019-10-12 NOTE — ED Notes (Signed)
Pt provided with phone for a call.

## 2019-10-12 NOTE — ED Notes (Signed)
Called [employer] for transport. Pt paperwork and belongings at nurses station.

## 2019-10-12 NOTE — Progress Notes (Signed)
   10/12/19 2200  Psych Admission Type (Psych Patients Only)  Admission Status Involuntary  Psychosocial Assessment  Patient Complaints Sadness  Eye Contact Fair  Facial Expression Anxious  Affect Preoccupied  Speech Soft  Interaction Assertive  Motor Activity Slow  Appearance/Hygiene Disheveled  Behavior Characteristics Anxious  Mood Suspicious  Aggressive Behavior  Effect No apparent injury  Thought Horticulturist, commercial of ideas  Content Paranoia  Delusions Grandeur;Persecutory  Perception Hallucinations  Hallucination Auditory  Judgment Impaired  Confusion WDL  Danger to Self  Current suicidal ideation? Denies  Danger to Others  Danger to Others None reported or observed

## 2019-10-12 NOTE — ED Notes (Signed)
Report given to [facility] at [facility] 500 bed 2

## 2019-10-12 NOTE — ED Notes (Signed)
Patient is cooperative at this time but only refusing to take a few medications. Patient states "Why are you keeping me alive? I don't want to live if I have to live in a mental hospital. Just let me go."

## 2019-10-13 ENCOUNTER — Ambulatory Visit: Payer: Medicare Other | Admitting: Physical Therapy

## 2019-10-13 DIAGNOSIS — F250 Schizoaffective disorder, bipolar type: Principal | ICD-10-CM

## 2019-10-13 LAB — GLUCOSE, CAPILLARY
Glucose-Capillary: 165 mg/dL — ABNORMAL HIGH (ref 70–99)
Glucose-Capillary: 160 mg/dL — ABNORMAL HIGH (ref 70–99)
Glucose-Capillary: 195 mg/dL — ABNORMAL HIGH (ref 70–99)
Glucose-Capillary: 162 mg/dL — ABNORMAL HIGH (ref 70–99)

## 2019-10-13 MED ORDER — INSULIN ASPART 100 UNIT/ML ~~LOC~~ SOLN
6.0000 [IU] | Freq: Three times a day (TID) | SUBCUTANEOUS | Status: DC
  Administered 2019-10-13 – 2019-10-14 (×3): 6 [IU] via SUBCUTANEOUS

## 2019-10-13 MED ORDER — INSULIN ASPART 100 UNIT/ML ~~LOC~~ SOLN
0.0000 [IU] | Freq: Three times a day (TID) | SUBCUTANEOUS | Status: DC
  Administered 2019-10-13: 2 [IU] via SUBCUTANEOUS
  Administered 2019-10-14: 1 [IU] via SUBCUTANEOUS
  Administered 2019-10-14 – 2019-10-15 (×2): 7 [IU] via SUBCUTANEOUS
  Administered 2019-10-15: 3 [IU] via SUBCUTANEOUS
  Administered 2019-10-16: 1 [IU] via SUBCUTANEOUS

## 2019-10-13 MED ORDER — INSULIN ASPART 100 UNIT/ML ~~LOC~~ SOLN: 0.0000 [IU] | Freq: Three times a day (TID) | SUBCUTANEOUS | Status: DC

## 2019-10-13 MED ORDER — INSULIN ASPART 100 UNIT/ML ~~LOC~~ SOLN: 6.0000 [IU] | Freq: Three times a day (TID) | SUBCUTANEOUS | Status: DC
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
## 2019-10-13 NOTE — Progress Notes (Signed)
Psychoeducational Group Note  Date:  10/13/2019 Time:  2022  Group Topic/Focus:  Wrap-Up Group:   The focus of this group is to help patients review their daily goal of treatment and discuss progress on daily workbooks.  Participation Level: Did Not Attend  Participation Quality:  Not Applicable  Affect:  Not Applicable  Cognitive:  Not Applicable  Insight:  Not Applicable  Engagement in Group: Not Applicable  Additional Comments:  The patient did not attend group this evening.   Archie Balboa S 10/13/2019, 8:22 PM

## 2019-10-13 NOTE — Progress Notes (Signed)
Recreation Therapy Notes  INPATIENT RECREATION THERAPY ASSESSMENT  Patient Details Name: [name] MRN: [id number] DOB: 05/17/1965 Today's Date: 10/13/2019       Information Obtained From: Patient  Able to Participate in Assessment/Interview: Yes  Patient Presentation:  (Drowsy)  Reason for Admission (Per Patient): Other (Comments) (Pt stated her heart beat was slow.)  Patient Stressors:  (None identified)  Coping Skills:   Music, Talk, Prayer, Read, Hot Bath/Shower  Leisure Interests (2+):  Music - Listen, Individual - Other (Comment), Individual - Reading (Preach)  Frequency of Recreation/Participation: Other (Comment) (Pt stated hasn't been much lately.)  Awareness of Community Resources:  No  Expressed Interest in [place]: No  County of Residence:  [place]  Patient Main Form of Transportation: Car  Patient Strengths:  Can take care of self with the right foods  Patient Identified Areas of Improvement:  Not right now  Patient Goal for Hospitalization:  "be happy, comfortable, not worry"  Current SI (including self-harm):  No  Current HI:  No  Current AVH: No  Staff Intervention Plan: Group Attendance, Collaborate with Interdisciplinary Treatment Team  Consent to Intern Participation: N/A    Victorino Sparrow, LRT/CTRS  Victorino Sparrow A 10/13/2019, 12:23 PM
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
## 2019-10-13 NOTE — H&P (Signed)
Psychiatric Admission Assessment Adult  Patient Identification: [name] MRN:  [id number] Date of Evaluation:  10/13/2019 Chief Complaint:  Schizoaffective disorder, bipolar type ([place]) [F25.0] Principal Diagnosis: <principal problem not specified> Diagnosis:  Active Problems:   Schizoaffective disorder, bipolar type ([place])  History of Present Illness: Patient is seen and examined.  Patient is a 54 year old female with a reported past psychiatric history significant for schizoaffective disorder who was originally brought to the emergency department on 10/09/2019.  Her son brought her to the [facility] emergency department.  According to the notes the patient thought she was having a fever and that was the reason why she came to the hospital.  According to the son the patient had been off her medications for approximately a year and a half.  She apparently was in [place] visiting a family member.  The patient had apparently told the son "we should kill them all".  Patient was apparently antagonistic and profane in the emergency department.  According to a telephone conversation with her primary psychiatrist (Dr Adele Schilder) the family had reported that the patient was cheeking her medications.  At home the patient was apparently suicidal, hysterically crying, and agitated.  The patient denies all this and stated that her blood pressure was running low, and that was the reason why she was here.  She reportedly has a history of schizoaffective disorder with at least 10 inpatient visits and multiple suicide attempts.  Apparently she had had a recent episode where she jumped from a running car and attempted to overdose on chlorine bleach.  That hospitalization was in March and 2021.  She had been seen at [facility] for many years and apparently did well on Geodon, but on her last psychiatric hospitalization had been switched to perphenazine.  She had also been previously treated with Depakote,  trazodone, temazepam, mirtazapine.  Review of the electronic medical record also revealed that she had had previously lithium toxicity, and ended up having to be dialyzed.  That was under [facility].  It is unclear exactly when that took place.  After the lithium overdose it was stopped, and she was switched to long-acting paliperidone injection.  It apparently is due today.  She apparently received this at [facility] on her most recent psychiatric hospitalization.  She currently denied suicidal or homicidal ideation.  She denied any auditory or visual hallucinations.  She denied any sense that people were out to get her or trying to harm her.  She was admitted to the hospital for evaluation and stabilization.  Associated Signs/Symptoms: Depression Symptoms:  insomnia, psychomotor agitation, anxiety, disturbed sleep, (Hypo) Manic Symptoms:  Delusions, Impulsivity, Irritable Mood, Labiality of Mood, Anxiety Symptoms:  Excessive Worry, Psychotic Symptoms:  Delusions, Paranoia, PTSD Symptoms: Negative Total Time spent with patient: 45 minutes  Past Psychiatric History: Patient reports a diagnosis schizoaffective disorder from old records.  She had her last psychiatric hospitalization in February 2021 at [facility] in [place].  She had been placed on lithium and Zoloft from [facility], but her lithium level had never been checked, and she was losing weight, feeling tired.  She was found to be lithium toxic and her kidney function was impaired.  She was seen in the outpatient clinic here by Dr. Adele Schilder.  His first visit with her was on 08/06/2019.  She is previously taken perphenazine, Zoloft, trazodone.  Family reported that she had been recently cheeking her medicine.  It looks like recently she had been switched to paliperidone and had  been receiving the paliperidone injection.  Is the patient at risk to self? Yes.    Has the patient been a risk to self in the past 6 months? Yes.     Has the patient been a risk to self within the distant past? Yes.    Is the patient a risk to others? No.  Has the patient been a risk to others in the past 6 months? No.  Has the patient been a risk to others within the distant past? No.   Prior Inpatient Therapy:   Prior Outpatient Therapy:    Alcohol Screening: 1. How often do you have a drink containing alcohol?: Never 2. How many drinks containing alcohol do you have on a typical day when you are drinking?: 1 or 2 3. How often do you have six or more drinks on one occasion?: Never AUDIT-C Score: 0 4. How often during the last year have you found that you were not able to stop drinking once you had started?: Never 5. How often during the last year have you failed to do what was normally expected from you because of drinking?: Never 6. How often during the last year have you needed a first drink in the morning to get yourself going after a heavy drinking session?: Never 7. How often during the last year have you had a feeling of guilt of remorse after drinking?: Never 8. How often during the last year have you been unable to remember what happened the night before because you had been drinking?: Never 9. Have you or someone else been injured as a result of your drinking?: No 10. Has a relative or friend or a doctor or another health worker been concerned about your drinking or suggested you cut down?: No Alcohol Use Disorder Identification Test Final Score (AUDIT): 0 Substance Abuse History in the last 12 months:  No. Consequences of Substance Abuse: Negative Previous Psychotropic Medications: Yes  Psychological Evaluations: Yes  Past Medical History:  Past Medical History:  Diagnosis Date  . Alcohol abuse   . Anemia, iron deficiency   . Arteriosclerotic cardiovascular disease (ASCVD)    Minimal at cath in [facility].stress nuclear study in 8/08 with nl EF; neg stress echo in 2010  . Community acquired pneumonia 01/03/10,  05/2010, 04/2012   2011; with pleural effusion-[facility] acute resp failure; intubated in Jan 2014 (HMPV pneumonia)  . Depression   . Diabetes mellitus, type 2 ([place]) 2000   Onset in 2000; no insulin  . Diarrhea 10/14/2013   Started 10/10/13, improved with Imodium.   . Diastolic dysfunction    grade 2 per echo 2011  . Dysphagia   . Gastroesophageal reflux disease    Schatzki's ring  . History of alcohol abuse 07/22/2007   Qualifier: Diagnosis of  By: Lenn Cal    . Hyperlipidemia   . Hypertension `   during treatment with Geodon  . Hypokalemia 12/25/2013  . Left knee pain 08/25/2014  . Obesity   . Oral candidiasis 05/16/2017  . PTSD (post-traumatic stress disorder)   . Pulmonary hypertension ([place]) 05/02/2012   Patient needs repeat echo in 06/2012   . Schizoaffective disorder    requiring multiple psychiatric admissions  . Viral URI 05/12/2013  . Viral wart on finger 10/08/2016  . Vision changes 08/12/2014    Past Surgical History:  Procedure Laterality Date  . COLONOSCOPY  01/2006   internal hemorrhoids  . COLONOSCOPY  01/10/2012   Dr. Rourk:Single anal canal hemorrhoidal  tag likely source of  trivial hematochezia; right-sided colonic diverticulosis  . DILATION AND CURETTAGE, DIAGNOSTIC / THERAPEUTIC  1992  . ESOPHAGEAL DILATION N/A 08/18/2015   Procedure: ESOPHAGEAL DILATION;  Surgeon: Daneil Dolin, MD;  Location[facility];  Service: Endoscopy;  Laterality: N/A;  . ESOPHAGOGASTRODUODENOSCOPY  09/16/08   Dr. Trevor Iha hiatal hernia/excoriations involving the cardia and mucosa consistent with trauma, antral erosions  of linear petechiae ? gastritis versus early gastric antral vascular  ectasia.Marland Kitchen biopsy showed reactive gastropathy. No H. pylori.  . ESOPHAGOGASTRODUODENOSCOPY  09/2007   Dr. Evalee Mutton ring, dilated to 26 French Maloney dilator, small hiatal hernia, antral erosions, biopsies reactive gastropathy.  . ESOPHAGOGASTRODUODENOSCOPY (EGD) WITH PROPOFOL N/A  12/17/2013   FVC:BSWHQPR antral erosions and petechiae. Small hiatal hernia. No endoscopic explanation for patient's symptoms  . ESOPHAGOGASTRODUODENOSCOPY (EGD) WITH PROPOFOL N/A 08/18/2015   Dr. Gala Romney: normal exam, s/p esophageal dilation  . ESOPHAGOGASTRODUODENOSCOPY (EGD) WITH PROPOFOL N/A 06/18/2019   Procedure: ESOPHAGOGASTRODUODENOSCOPY (EGD) WITH PROPOFOL;  Surgeon: Daneil Dolin, MD;  Location: [facility];  Service: Endoscopy;  Laterality: N/A;  2:30pm - office moved to 10:15  . SAVORY DILATION  07/17/2011   [facility] sedation-->distal esophageal stricture s/p dilation, chronic gastritis, multiple ulcers in stomach. no h.pylori   Family History:  Family History  Problem Relation Age of Onset  . Hypertension Mother   . Stroke Father        deceased at age 33  . Colon cancer Other   . Heart disease Sister   . Diabetes Other   . High Cholesterol Other   . Arthritis Other   . Anesthesia problems Neg Hx   . Hypotension Neg Hx   . Malignant hyperthermia Neg Hx   . Pseudochol deficiency Neg Hx    Family Psychiatric  History: Noncontributory Tobacco Screening:   Social History:  Social History   Substance and Sexual Activity  Alcohol Use No  . Alcohol/week: 0.0 standard drinks   Comment: hx of ETOH abuse     Social History   Substance and Sexual Activity  Drug Use No    Additional Social History:                           Allergies:   Allergies  Allergen Reactions  . Cephalexin     Trouble breathing, felt like she had bumps in her throat.  . Metronidazole Shortness Of Breath and Swelling  . Orange Itching  . Orange [employer] and Other (See Comments)  . Other Shortness Of Breath and Itching    Takes Benadryl before eating shrimp.  . Shrimp [Shellfish Allergy] Shortness Of Breath and Itching    Takes Benadryl before eating shrimp.  Marland Kitchen Penicillins Hives, Swelling and Other (See Comments)    Has patient had a PCN reaction causing immediate rash,  facial/tongue/throat swelling, SOB or lightheadedness with hypotension: Yes Has patient had a PCN reaction causing severe rash involving mucus membranes or skin necrosis: No Has patient had a PCN reaction that required hospitalization No Has patient had a PCN reaction occurring within the last 10 years: Yes If all of the above answers are "NO", then may proceed with Cephalosporin use.    Fever as well  . Sulfonamide Derivatives Hives    fever  . Glipizide Other (See Comments)    psychosis  . Ace Inhibitors Cough    06/2016  . Sulfamethoxazole Rash  . Sulfamethoxazole-Trimethoprim Rash  . Trimethoprim Other (See  Comments) and Rash   Lab Results:  Results for orders placed or performed during the hospital encounter of 10/12/19 (from the past 48 hour(s))  Glucose, capillary     Status: Abnormal   Collection Time: 10/12/19  7:41 PM  Result Value Ref Range   Glucose-Capillary 218 (H) 70 - 99 mg/dL    Comment: Glucose reference range applies only to samples taken after fasting for at least 8 hours.  Glucose, capillary     Status: Abnormal   Collection Time: 10/13/19  6:19 AM  Result Value Ref Range   Glucose-Capillary 165 (H) 70 - 99 mg/dL    Comment: Glucose reference range applies only to samples taken after fasting for at least 8 hours.  Glucose, capillary     Status: Abnormal   Collection Time: 10/13/19 11:40 AM  Result Value Ref Range   Glucose-Capillary 160 (H) 70 - 99 mg/dL    Comment: Glucose reference range applies only to samples taken after fasting for at least 8 hours.   *Note: Due to a large number of results and/or encounters for the requested time period, some results have not been displayed. A complete set of results can be found in Results Review.    Blood Alcohol level:  Lab Results  Component Value Date   ETH <10 10/10/2019   ETH <10 35/32/9924    Metabolic Disorder Labs:  Lab Results  Component Value Date   HGBA1C 7.2 (A) 08/19/2019   MPG 165.68  06/04/2019   MPG 225.95 09/15/2018   Lab Results  Component Value Date   PROLACTIN 11.6 09/15/2018   Lab Results  Component Value Date   CHOL 86 06/05/2019   TRIG 118 06/05/2019   HDL 28 (L) 06/05/2019   CHOLHDL 3.1 06/05/2019   VLDL 24 06/05/2019   LDLCALC 34 06/05/2019   LDLCALC 88 09/15/2018    Current Medications: Current Facility-Administered Medications  Medication Dose Route Frequency Provider Last Rate Last Admin  . acetaminophen (TYLENOL) tablet 650 mg  650 mg Oral Q6H PRN Patrecia Pour, NP      . albuterol (VENTOLIN HFA) 108 (90 Base) MCG/ACT inhaler 1-2 puff  1-2 puff Inhalation Q6H PRN Patrecia Pour, NP      . alum & mag hydroxide-simeth (MAALOX/MYLANTA) 200-200-20 MG/5ML suspension 30 mL  30 mL Oral Q4H PRN Patrecia Pour, NP      . amLODipine (NORVASC) tablet 10 mg  10 mg Oral Daily Patrecia Pour, NP   10 mg at 10/13/19 0805  . [START ON 10/14/2019] ferrous sulfate tablet 325 mg  325 mg Oral Bess Harvest, NP      . insulin glargine (LANTUS) injection 10 Units  10 Units Subcutaneous Daily Patrecia Pour, NP   10 Units at 10/12/19 2047  . losartan (COZAAR) tablet 50 mg  50 mg Oral Daily Patrecia Pour, NP   50 mg at 10/13/19 0806  . magnesium hydroxide (MILK OF MAGNESIA) suspension 30 mL  30 mL Oral Daily PRN Patrecia Pour, NP      . metFORMIN (GLUCOPHAGE) tablet 1,000 mg  1,000 mg Oral BID WC Patrecia Pour, NP   1,000 mg at 10/13/19 0805  . metoprolol tartrate (LOPRESSOR) tablet 100 mg  100 mg Oral BID Patrecia Pour, NP   100 mg at 10/13/19 0805  . mirtazapine (REMERON) tablet 15 mg  15 mg Oral QHS Lord, Asa Saunas, NP      . nicotine (NICODERM CQ -  dosed in mg/24 hours) patch 21 mg  21 mg Transdermal Daily Patrecia Pour, NP      . ondansetron [facility]) tablet 4 mg  4 mg Oral Q8H PRN Patrecia Pour, NP      . paliperidone (INVEGA) 24 hr tablet 6 mg  6 mg Oral Daily Patrecia Pour, NP   6 mg at 10/13/19 0805  . pantoprazole (PROTONIX) EC tablet  40 mg  40 mg Oral BID Patrecia Pour, NP   40 mg at 10/13/19 0804  . pravastatin (PRAVACHOL) tablet 40 mg  40 mg Oral q1800 Patrecia Pour, NP      . senna-docusate (Senokot-S) tablet 1 tablet  1 tablet Oral QHS PRN Patrecia Pour, NP      . sertraline (ZOLOFT) tablet 100 mg  100 mg Oral Daily Patrecia Pour, NP   100 mg at 10/13/19 0806  . spironolactone (ALDACTONE) tablet 50 mg  50 mg Oral Daily Patrecia Pour, NP   50 mg at 10/13/19 0805  . thiamine tablet 100 mg  100 mg Oral Daily Patrecia Pour, NP   100 mg at 10/13/19 5852   PTA Medications: Medications Prior to Admission  Medication Sig Dispense Refill Last Dose  . Accu-Chek Softclix Lancets lancets Use as instructed 400 each 12   . acetaminophen (TYLENOL) 325 MG tablet Take 650 mg by mouth every 6 (six) hours as needed for mild pain, moderate pain or headache.     . albuterol (VENTOLIN HFA) 108 (90 Base) MCG/ACT inhaler Inhale 1-2 puffs into the lungs every 6 (six) hours as needed for wheezing or shortness of breath. 18 g 5   . amLODipine (NORVASC) 10 MG tablet Take 1 tablet (10 mg total) by mouth daily. For high blood pressure (Patient taking differently: Take 10 mg by mouth at bedtime. ) 90 tablet 3   . benztropine (COGENTIN) 1 MG tablet Take 1/2 to one tab as needed for tremor (Patient taking differently: Take 1 mg by mouth at bedtime. ) 30 tablet 1   . insulin glargine (LANTUS) 100 UNIT/ML injection Inject 0.1 mLs (10 Units total) into the skin daily. (Patient taking differently: Inject 10 Units into the skin daily as needed. High blood sugar) 10 mL 11   . losartan (COZAAR) 50 MG tablet Take 1 tablet (50 mg total) by mouth daily. 90 tablet 1   . lovastatin (MEVACOR) 40 MG tablet Take 1 tablet (40 mg total) by mouth every evening. 90 tablet 0   . Magnesium 500 MG TABS Take 500 mg by mouth daily.     . metFORMIN (GLUCOPHAGE) 1000 MG tablet Take 1 tablet (1,000 mg total) by mouth 2 (two) times daily with a meal. 180 tablet 0   .  metoprolol tartrate (LOPRESSOR) 100 MG tablet Take 1 tablet (100 mg total) by mouth 2 (two) times daily. 180 tablet 0   . mirtazapine (REMERON) 15 MG tablet Take 1 tablet (15 mg total) by mouth at bedtime. 30 tablet 1   . nicotine (NICODERM CQ - DOSED IN MG/24 HR) 7 mg/24hr patch Place 1 patch (7 mg total) onto the skin daily. (Patient not taking: Reported on 10/10/2019) 21 patch 0 Not Taking at Unknown time  . perphenazine (TRILAFON) 4 MG tablet Take 1 tablet (4 mg total) by mouth 2 (two) times daily. 60 tablet 1   . sertraline (ZOLOFT) 100 MG tablet Take 1 tablet (100 mg total) by mouth daily. (Patient taking differently: Take 50  mg by mouth in the morning and at bedtime. ) 30 tablet 1   . spironolactone (ALDACTONE) 50 MG tablet Take 1 tablet (50 mg total) by mouth daily. 90 tablet 3   . Thiamine HCl (VITAMIN B-1) 100 MG TABS Take 1 tablet (100 mg total) by mouth daily. 90 tablet 1     Musculoskeletal: Strength & Muscle Tone: within normal limits Gait & Station: normal Patient leans: N/A  Psychiatric Specialty Exam: Physical Exam Vitals and nursing note reviewed.  Constitutional:      Appearance: Normal appearance.  HENT:     Head: Normocephalic and atraumatic.  Pulmonary:     Effort: Pulmonary effort is normal.  Neurological:     General: No focal deficit present.     Mental Status: She is alert and oriented to person, place, and time.     Review of Systems  Blood pressure 114/84, pulse 88, temperature 98.6 F (37 C), temperature source Oral, resp. rate 18, height _0  (1.651 m), weight 68.5 kg, SpO2 100 %.Body mass index is 25.13 kg/m.  General Appearance: Casual  Eye Contact:  Fair  Speech:  Normal Rate  Volume:  Normal  Mood:  Dysphoric  Affect:  Flat  Thought Process:  Goal Directed and Descriptions of Associations: Circumstantial  Orientation:  Negative  Thought Content:  Negative  Suicidal Thoughts:  No  Homicidal Thoughts:  No  Memory:  Immediate;   Poor Recent;    Poor Remote;   Poor  Judgement:  Impaired  Insight:  Lacking  Psychomotor Activity:  Normal  Concentration:  Concentration: Fair and Attention Span: Fair  Recall:  [employer] of Knowledge:  Fair  Language:  Good  Akathisia:  Negative  Handed:  Right  AIMS (if indicated):     Assets:  Desire for Improvement Resilience  ADL's:  Intact  Cognition:  WNL  Sleep:  Number of Hours: 4.25    Treatment Plan Summary: Daily contact with patient to assess and evaluate symptoms and progress in treatment, Medication management and Plan : Patient is seen and examined.  Patient is a 54 year old female with the above-stated past psychiatric history who was admitted secondary to concern for noncompliance with medications, worsening psychosis and agitation.  She will be admitted to the hospital.  She will be integrated in the milieu.  She will be encouraged to attend groups.  She is apparently due for the long-acting paliperidone injection today.  We will give her 234 mg IM x1.  We will also continue her outpatient psychiatric medications including mirtazapine 15 mg p.o. nightly, sertraline 100 mg p.o. daily.  She will also be continued on paliperidone 6 mg p.o. daily orally.  She has a history of diabetes mellitus and we will continue her treatment including Lantus insulin and Metformin.  We will check her sugars 4 times a day to monitor for that.  She has a history of hypertension and is currently taking spironolactone, losartan, metoprolol and amlodipine and those will be continued.  She apparently has a history of anemia is taking ferrous sulfate.  Review of her admission laboratories revealed a glucose of 179, creatinine of 1.25.  Liver function enzymes were normal.  Her CBC was normal.  Acetaminophen was less than 10, salicylate was less than 7.  Her beta-hCG was negative.  Blood alcohol was less than 10.  Drug screen was negative.  EKG showed sinus rhythm with a normal QTc interval.  Her blood pressure this  a.m. is 107/87.  Pulse  was 96.  She is afebrile.  Observation Level/Precautions:  15 minute checks  Laboratory:  Chemistry Profile  Psychotherapy:    Medications:    Consultations:    Discharge Concerns:    Estimated LOS:  Other:     Physician Treatment Plan for Primary Diagnosis: <principal problem not specified> Long Term Goal(s): Improvement in symptoms so as ready for discharge  Short Term Goals: Ability to identify changes in lifestyle to reduce recurrence of condition will improve, Ability to verbalize feelings will improve, Ability to demonstrate self-control will improve, Ability to identify and develop effective coping behaviors will improve, Ability to maintain clinical measurements within normal limits will improve and Compliance with prescribed medications will improve  Physician Treatment Plan for Secondary Diagnosis: Active Problems:   Schizoaffective disorder, bipolar type ([place])  Long Term Goal(s): Improvement in symptoms so as ready for discharge  Short Term Goals: Ability to identify changes in lifestyle to reduce recurrence of condition will improve, Ability to verbalize feelings will improve, Ability to demonstrate self-control will improve, Ability to identify and develop effective coping behaviors will improve, Ability to maintain clinical measurements within normal limits will improve and Compliance with prescribed medications will improve  I certify that inpatient services furnished can reasonably be expected to improve the patient's condition.    Sharma Covert, MD 6/29/20211:16 PM

## 2019-10-14 LAB — GLUCOSE, CAPILLARY
Glucose-Capillary: 102 mg/dL — ABNORMAL HIGH (ref 70–99)
Glucose-Capillary: 311 mg/dL — ABNORMAL HIGH (ref 70–99)
Glucose-Capillary: 47 mg/dL — ABNORMAL LOW (ref 70–99)
Glucose-Capillary: 46 mg/dL — ABNORMAL LOW (ref 70–99)
Glucose-Capillary: 99 mg/dL (ref 70–99)

## 2019-10-14 MED ORDER — INSULIN GLARGINE 100 UNIT/ML ~~LOC~~ SOLN
14.0000 [IU] | Freq: Every day | SUBCUTANEOUS | Status: DC
  Administered 2019-10-14 – 2019-10-15 (×2): 14 [IU] via SUBCUTANEOUS

## 2019-10-14 MED ORDER — GLUCOSE 4 G PO CHEW
CHEWABLE_TABLET | ORAL | Status: AC
  Filled 2019-10-14: qty 1

## 2019-10-14 NOTE — Tx Team (Signed)
Interdisciplinary Treatment and Diagnostic Plan Update  10/14/2019 Time of Session: 9:15am [name] MRN: [id number]  Principal Diagnosis: <principal problem not specified>  Secondary Diagnoses: Active Problems:   Schizoaffective disorder, bipolar type ([place])   Current Medications:  Current Facility-Administered Medications  Medication Dose Route Frequency Provider Last Rate Last Admin  . acetaminophen (TYLENOL) tablet 650 mg  650 mg Oral Q6H PRN Patrecia Pour, NP      . albuterol (VENTOLIN HFA) 108 (90 Base) MCG/ACT inhaler 1-2 puff  1-2 puff Inhalation Q6H PRN Patrecia Pour, NP      . alum & mag hydroxide-simeth (MAALOX/MYLANTA) 200-200-20 MG/5ML suspension 30 mL  30 mL Oral Q4H PRN Patrecia Pour, NP      . amLODipine (NORVASC) tablet 10 mg  10 mg Oral Daily Patrecia Pour, NP   10 mg at 10/14/19 4098  . ferrous sulfate tablet 325 mg  325 mg Oral Bess Harvest, NP   325 mg at 10/14/19 0916  . insulin aspart (novoLOG) injection 0-9 Units  0-9 Units Subcutaneous TID WC Sharma Covert, MD   1 Units at 10/14/19 [phone]  . insulin aspart (novoLOG) injection 6 Units  6 Units Subcutaneous TID WC Sharma Covert, MD   6 Units at 10/14/19 [phone]  . insulin glargine (LANTUS) injection 14 Units  14 Units Subcutaneous Daily Sharma Covert, MD   14 Units at 10/14/19 [phone]  . losartan (COZAAR) tablet 50 mg  50 mg Oral Daily Patrecia Pour, NP   50 mg at 10/14/19 1308  . magnesium hydroxide (MILK OF MAGNESIA) suspension 30 mL  30 mL Oral Daily PRN Patrecia Pour, NP      . metFORMIN (GLUCOPHAGE) tablet 1,000 mg  1,000 mg Oral BID WC Patrecia Pour, NP   1,000 mg at 10/14/19 0904  . metoprolol tartrate (LOPRESSOR) tablet 100 mg  100 mg Oral BID Patrecia Pour, NP   100 mg at 10/14/19 6578  . mirtazapine (REMERON) tablet 15 mg  15 mg Oral QHS Patrecia Pour, NP   15 mg at 10/13/19 2038  . nicotine (NICODERM CQ - dosed in mg/24 hours) patch 21 mg  21 mg Transdermal Daily  Lord, Jamison Y, NP      . ondansetron [facility]) tablet 4 mg  4 mg Oral Q8H PRN Patrecia Pour, NP      . paliperidone (INVEGA) 24 hr tablet 6 mg  6 mg Oral Daily Patrecia Pour, NP   6 mg at 10/14/19 0904  . pantoprazole (PROTONIX) EC tablet 40 mg  40 mg Oral BID Patrecia Pour, NP   40 mg at 10/14/19 4696  . pravastatin (PRAVACHOL) tablet 40 mg  40 mg Oral q1800 Patrecia Pour, NP   40 mg at 10/13/19 1735  . senna-docusate (Senokot-S) tablet 1 tablet  1 tablet Oral QHS PRN Patrecia Pour, NP      . sertraline (ZOLOFT) tablet 100 mg  100 mg Oral Daily Patrecia Pour, NP   100 mg at 10/14/19 2952  . spironolactone (ALDACTONE) tablet 50 mg  50 mg Oral Daily Patrecia Pour, NP   50 mg at 10/14/19 8413  . thiamine tablet 100 mg  100 mg Oral Daily Patrecia Pour, NP   100 mg at 10/14/19 2440   PTA Medications: Medications Prior to Admission  Medication Sig Dispense Refill Last Dose  . Accu-Chek Softclix Lancets lancets Use as instructed 400 each  12   . acetaminophen (TYLENOL) 325 MG tablet Take 650 mg by mouth every 6 (six) hours as needed for mild pain, moderate pain or headache.     . albuterol (VENTOLIN HFA) 108 (90 Base) MCG/ACT inhaler Inhale 1-2 puffs into the lungs every 6 (six) hours as needed for wheezing or shortness of breath. 18 g 5   . amLODipine (NORVASC) 10 MG tablet Take 1 tablet (10 mg total) by mouth daily. For high blood pressure (Patient taking differently: Take 10 mg by mouth at bedtime. ) 90 tablet 3   . benztropine (COGENTIN) 1 MG tablet Take 1/2 to one tab as needed for tremor (Patient taking differently: Take 1 mg by mouth at bedtime. ) 30 tablet 1   . insulin glargine (LANTUS) 100 UNIT/ML injection Inject 0.1 mLs (10 Units total) into the skin daily. (Patient taking differently: Inject 10 Units into the skin daily as needed. High blood sugar) 10 mL 11   . losartan (COZAAR) 50 MG tablet Take 1 tablet (50 mg total) by mouth daily. 90 tablet 1   . lovastatin (MEVACOR) 40  MG tablet Take 1 tablet (40 mg total) by mouth every evening. 90 tablet 0   . Magnesium 500 MG TABS Take 500 mg by mouth daily.     . metFORMIN (GLUCOPHAGE) 1000 MG tablet Take 1 tablet (1,000 mg total) by mouth 2 (two) times daily with a meal. 180 tablet 0   . metoprolol tartrate (LOPRESSOR) 100 MG tablet Take 1 tablet (100 mg total) by mouth 2 (two) times daily. 180 tablet 0   . mirtazapine (REMERON) 15 MG tablet Take 1 tablet (15 mg total) by mouth at bedtime. 30 tablet 1   . nicotine (NICODERM CQ - DOSED IN MG/24 HR) 7 mg/24hr patch Place 1 patch (7 mg total) onto the skin daily. (Patient not taking: Reported on 10/10/2019) 21 patch 0 Not Taking at Unknown time  . perphenazine (TRILAFON) 4 MG tablet Take 1 tablet (4 mg total) by mouth 2 (two) times daily. 60 tablet 1   . sertraline (ZOLOFT) 100 MG tablet Take 1 tablet (100 mg total) by mouth daily. (Patient taking differently: Take 50 mg by mouth in the morning and at bedtime. ) 30 tablet 1   . spironolactone (ALDACTONE) 50 MG tablet Take 1 tablet (50 mg total) by mouth daily. 90 tablet 3   . Thiamine HCl (VITAMIN B-1) 100 MG TABS Take 1 tablet (100 mg total) by mouth daily. 90 tablet 1     Patient Stressors: Financial difficulties Health problems Medication change or noncompliance  Patient Strengths: Ability for insight Average or above average intelligence Supportive family/friends  Treatment Modalities: Medication Management, Group therapy, Case management,  1 to 1 session with clinician, Psychoeducation, Recreational therapy.   Physician Treatment Plan for Primary Diagnosis: <principal problem not specified> Long Term Goal(s): Improvement in symptoms so as ready for discharge Improvement in symptoms so as ready for discharge   Short Term Goals: Ability to identify changes in lifestyle to reduce recurrence of condition will improve Ability to verbalize feelings will improve Ability to demonstrate self-control will improve Ability  to identify and develop effective coping behaviors will improve Ability to maintain clinical measurements within normal limits will improve Compliance with prescribed medications will improve Ability to identify changes in lifestyle to reduce recurrence of condition will improve Ability to verbalize feelings will improve Ability to demonstrate self-control will improve Ability to identify and develop effective coping behaviors will improve Ability to  maintain clinical measurements within normal limits will improve Compliance with prescribed medications will improve  Medication Management: Evaluate patient's response, side effects, and tolerance of medication regimen.  Therapeutic Interventions: 1 to 1 sessions, Unit Group sessions and Medication administration.  Evaluation of Outcomes: Progressing  Physician Treatment Plan for Secondary Diagnosis: Active Problems:   Schizoaffective disorder, bipolar type ([place])  Long Term Goal(s): Improvement in symptoms so as ready for discharge Improvement in symptoms so as ready for discharge   Short Term Goals: Ability to identify changes in lifestyle to reduce recurrence of condition will improve Ability to verbalize feelings will improve Ability to demonstrate self-control will improve Ability to identify and develop effective coping behaviors will improve Ability to maintain clinical measurements within normal limits will improve Compliance with prescribed medications will improve Ability to identify changes in lifestyle to reduce recurrence of condition will improve Ability to verbalize feelings will improve Ability to demonstrate self-control will improve Ability to identify and develop effective coping behaviors will improve Ability to maintain clinical measurements within normal limits will improve Compliance with prescribed medications will improve     Medication Management: Evaluate patient's response, side effects, and tolerance of  medication regimen.  Therapeutic Interventions: 1 to 1 sessions, Unit Group sessions and Medication administration.  Evaluation of Outcomes: Progressing   RN Treatment Plan for Primary Diagnosis: <principal problem not specified> Long Term Goal(s): Knowledge of disease and therapeutic regimen to maintain health will improve  Short Term Goals: Ability to remain free from injury will improve, Ability to identify and develop effective coping behaviors will improve and Compliance with prescribed medications will improve  Medication Management: RN will administer medications as ordered by provider, will assess and evaluate patient's response and provide education to patient for prescribed medication. RN will report any adverse and/or side effects to prescribing provider.  Therapeutic Interventions: 1 on 1 counseling sessions, Psychoeducation, Medication administration, Evaluate responses to treatment, Monitor vital signs and CBGs as ordered, Perform/monitor CIWA, COWS, AIMS and Fall Risk screenings as ordered, Perform wound care treatments as ordered.  Evaluation of Outcomes: Progressing   LCSW Treatment Plan for Primary Diagnosis: <principal problem not specified> Long Term Goal(s): Safe transition to appropriate next level of care at discharge, Engage patient in therapeutic group addressing interpersonal concerns.  Short Term Goals: Engage patient in aftercare planning with referrals and resources, Increase social support, Identify triggers associated with mental health/substance abuse issues and Increase skills for wellness and recovery  Therapeutic Interventions: Assess for all discharge needs, 1 to 1 time with Social worker, Explore available resources and support systems, Assess for adequacy in community support network, Educate family and significant other(s) on suicide prevention, Complete Psychosocial Assessment, Interpersonal group therapy.  Evaluation of Outcomes:  Progressing   Progress in Treatment: Attending groups: Yes. Participating in groups: Yes. Taking medication as prescribed: Yes. Toleration medication: Yes. Family/Significant other contact made: Yes, individual(s) contacted:  mother. Patient understands diagnosis: Yes. Discussing patient identified problems/goals with staff: Yes. Medical problems stabilized or resolved: Yes. Denies suicidal/homicidal ideation: Yes. Issues/concerns per patient self-inventory: No. Other: none.  New problem(s) identified: No, Describe:  none.  New Short Term/Long Term Goal(s): medication stabilization, elimination of SI thoughts, development of comprehensive mental wellness plan.   Patient Goals:    Discharge Plan or Barriers: None   Reason for Continuation of Hospitalization: Delusions  Hallucinations Medication stabilization  Estimated Length of Stay: 3-5 days  Attendees: Patient: Did not attend 10/14/2019   Physician:  10/14/2019   Nursing:  10/14/2019  RN Care Manager: 10/14/2019   Social Worker: Darletta Moll, [place] 10/14/2019   Recreational Therapist:  10/14/2019   Other:  10/14/2019  Other:  10/14/2019   Other: 10/14/2019    Scribe for Treatment Team: Vassie Moselle, [place] 10/14/2019 11:48 AM

## 2019-10-14 NOTE — [facility] Suicide Risk Assessment (Signed)
[place] INPATIENT:  Family/Significant Other Suicide Prevention Education   Suicide Prevention Education: Education Completed; Mother, Jasper Loser [phone]), has been identified by the patient as the family member/significant other with whom the patient will be residing, and identified as the person(s) who will aid the patient in the event of a mental health crisis (suicidal ideations/suicide attempt).  With written consent from the patient, the family member/significant other has been provided the following suicide prevention education, prior to the and/or following the discharge of the patient.  The suicide prevention education provided includes the following:  Suicide risk factors  Suicide prevention and interventions  National Suicide Hotline telephone number  [facility] assessment telephone number  [facility] Emergency Assistance [place] and/or Residential Mobile Crisis Unit telephone number   Request made of family/significant other to:  Remove weapons (e.g., guns, rifles, knives), all items previously/currently identified as safety concern.    Remove drugs/medications (over-the-counter, prescriptions, illicit drugs), all items previously/currently identified as a safety concern.   The family member/significant other verbalizes understanding of the suicide prevention education information provided.  The family member/significant other agrees to remove the items of safety concern listed above.   CSW spoke with this patients mother who reported that this patient became symptomatic after missing several doses of her medications. Per pt's mother this patient has appeared to be doing better from phone conversations she has had with her since pt was admitted. Pt's mother stated that key signs she is not doing well are lack of appetite, responding to internal stimuli and being tearful. Patients mother reported that at discharge she planned for this  patient to stay with her for a time period while she continues to return to her baseline.    Darletta Moll MSW, [place] Worker  [facility]

## 2019-10-14 NOTE — Progress Notes (Signed)
Hypoglycemic Event  CBG: 47  Treatment: 4 oz juice/soda and 8 oz juice/soda  Symptoms: None  Follow-up CBG: Time:2030 CBG Result: 46  Treatment: 15 gram snack  Follow-up CBG: [id number] CBG Result: 99    Possible Reasons for Event: Unknown  Comments/MD notified: continue to monitor , keep notified    [place]

## 2019-10-14 NOTE — Progress Notes (Signed)
[facility] MD Progress Note  10/14/2019 11:12 AM BELVIE IRIBE  MRN:  [id number] Subjective: Patient is a 54 year old female with a reported past psychiatric history significant for schizoaffective disorder who was originally brought to the emergency department on 10/09/2019 secondary to being off her psychiatric medications for quite a while, being psychotic, agitated.  Objective: Patient is seen and examined.  Patient is a 54 year old female with the above-stated past psychiatric history who is seen in follow-up.  She has been of no problems that she has been in the hospital.  She denies all psychiatric symptoms.  She received the long-acting paliperidone injection yesterday.  She continues to minimize everything.  She will talk about how long she really had been off her medications.  We discussed the fact that I wanted to give the medications time to get into her system.  Her family has spoken to her to some degree since she has been in the hospital, and they feel that she has sounded better.  Her vital signs are stable, she is afebrile.  She slept 7 hours last night.  She denied any auditory or visual hallucinations.  She denied any suicidal or homicidal ideation.  She denied any side effects to her current medications.  Her blood sugar this morning is 162.  Review of her laboratories showed no new lab results.  There is still not a TSH in the chart.  Principal Problem: <principal problem not specified> Diagnosis: Active Problems:   Schizoaffective disorder, bipolar type ([place])  Total Time spent with patient: 20 minutes  Past Psychiatric History: See admission H&P  Past Medical History:  Past Medical History:  Diagnosis Date   Alcohol abuse    Anemia, iron deficiency    Arteriosclerotic cardiovascular disease (ASCVD)    Minimal at cath in 07;negative pharm.stress nuclear study in 8/08 with nl EF; neg stress echo in 2010   Community acquired pneumonia 01/03/10, 05/2010, 04/2012   2011; with  pleural effusion-hosp [facility] acute resp failure; intubated in Jan 2014 (HMPV pneumonia)   Depression    Diabetes mellitus, type 2 ([place]) 2000   Onset in 2000; no insulin   Diarrhea 10/14/2013   Started 10/10/13, improved with Imodium.    Diastolic dysfunction    grade 2 per echo 2011   Dysphagia    Gastroesophageal reflux disease    Schatzki's ring   History of alcohol abuse 07/22/2007   Qualifier: Diagnosis of  By: Lenn Cal     Hyperlipidemia    Hypertension `   during treatment with Geodon   Hypokalemia 12/25/2013   Left knee pain 08/25/2014   Obesity    Oral candidiasis 05/16/2017   PTSD (post-traumatic stress disorder)    Pulmonary hypertension ([place]) 05/02/2012   Patient needs repeat echo in 06/2012    Schizoaffective disorder    requiring multiple psychiatric admissions   Viral URI 05/12/2013   Viral wart on finger 10/08/2016   Vision changes 08/12/2014    Past Surgical History:  Procedure Laterality Date   COLONOSCOPY  01/2006   internal hemorrhoids   COLONOSCOPY  01/10/2012   Dr. Rourk:Single anal canal hemorrhoidal tag likely source of  trivial hematochezia; right-sided colonic diverticulosis   DILATION AND CURETTAGE, DIAGNOSTIC / THERAPEUTIC  1992   ESOPHAGEAL DILATION N/A 08/18/2015   Procedure: ESOPHAGEAL DILATION;  Surgeon: Daneil Dolin, MD;  Location: [facility];  Service: Endoscopy;  Laterality: N/A;   ESOPHAGOGASTRODUODENOSCOPY  09/16/08   Dr. Trevor Iha hiatal hernia/excoriations involving the cardia and mucosa  consistent with trauma, antral erosions  of linear petechiae ? gastritis versus early gastric antral vascular  ectasia.Marland Kitchen biopsy showed reactive gastropathy. No H. pylori.   ESOPHAGOGASTRODUODENOSCOPY  09/2007   Dr. Evalee Mutton ring, dilated to 16 French Maloney dilator, small hiatal hernia, antral erosions, biopsies reactive gastropathy.   ESOPHAGOGASTRODUODENOSCOPY (EGD) WITH PROPOFOL N/A 12/17/2013   BSJ:[id number] antral  erosions and petechiae. Small hiatal hernia. No endoscopic explanation for patient's symptoms   ESOPHAGOGASTRODUODENOSCOPY (EGD) WITH PROPOFOL N/A 08/18/2015   Dr. Gala Romney: normal exam, s/p esophageal dilation   ESOPHAGOGASTRODUODENOSCOPY (EGD) WITH PROPOFOL N/A 06/18/2019   Procedure: ESOPHAGOGASTRODUODENOSCOPY (EGD) WITH PROPOFOL;  Surgeon: Daneil Dolin, MD;  Location: [facility];  Service: Endoscopy;  Laterality: N/A;  2:30pm - office moved to 10:15   SAVORY DILATION  07/17/2011   [facility] sedation-->distal esophageal stricture s/p dilation, chronic gastritis, multiple ulcers in stomach. no h.pylori   Family History:  Family History  Problem Relation Age of Onset   Hypertension Mother    Stroke Father        deceased at age 79   Colon cancer Other    Heart disease Sister    Diabetes Other    High Cholesterol Other    Arthritis Other    Anesthesia problems Neg Hx    Hypotension Neg Hx    Malignant hyperthermia Neg Hx    Pseudochol deficiency Neg Hx    Family Psychiatric  History: See admission H&P Social History:  Social History   Substance and Sexual Activity  Alcohol Use No   Alcohol/week: 0.0 standard drinks   Comment: hx of ETOH abuse     Social History   Substance and Sexual Activity  Drug Use No    Social History   Socioeconomic History   Marital status: Single    Spouse name: Not on file   Number of children: 2   Years of education: some colge   Highest education level: Not on file  Occupational History   Occupation: Disability   Occupation: UNEMPLOYED    Fish farm manager: UNEMPLOYED  Tobacco Use   Smoking status: Current Some Day Smoker    Packs/day: 1.00    Years: 30.00    Pack years: 30.00    Types: Cigarettes    Start date: 05/18/2012   Smokeless tobacco: Never Used   Tobacco comment: 1 PPD  Vaping Use   Vaping Use: Never used  Substance and Sexual Activity   Alcohol use: No    Alcohol/week: 0.0 standard drinks    Comment:  hx of ETOH abuse   Drug use: No   Sexual activity: Not Currently    Partners: Male    Birth control/protection: None  Other Topics Concern   Not on file  Social History Narrative   Live alone, no animals in the house; 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, has interviewed for job again (08/16/11); On disability (depression qualifies); Graduated high school in [place] and some community college in [place]   Caffeine use: 2 sodas per day   Social Determinants of Health   Financial Resource Strain:    Difficulty of Paying Living Expenses:   Food Insecurity:    Worried About Charity fundraiser in the Last Year:    Arboriculturist in the Last Year:   Transportation Needs: No Transportation Needs   Lack of Transportation (Medical): No   Lack of Transportation (Non-Medical): No  Physical Activity:    Days of Exercise per Week:  Minutes of Exercise per Session:   Stress:    Feeling of Stress :   Social Connections: Unknown   Frequency of Communication with Friends and Family: More than three times a week   Frequency of Social Gatherings with Friends and Family: Three times a week   Attends Religious Services: Not on file   Active Member of Clubs or Organizations: Not on file   Attends Archivist Meetings: Not on file   Marital Status: Not on file   Additional Social History:                         Sleep: Fair  Appetite:  Good  Current Medications: Current Facility-Administered Medications  Medication Dose Route Frequency Provider Last Rate Last Admin   acetaminophen (TYLENOL) tablet 650 mg  650 mg Oral Q6H PRN Patrecia Pour, NP       albuterol (VENTOLIN HFA) 108 (90 Base) MCG/ACT inhaler 1-2 puff  1-2 puff Inhalation Q6H PRN Patrecia Pour, NP       alum & mag hydroxide-simeth (MAALOX/MYLANTA) 200-200-20 MG/5ML suspension 30 mL  30 mL Oral Q4H PRN Patrecia Pour, NP       amLODipine (NORVASC) tablet 10 mg  10 mg Oral  Daily Patrecia Pour, NP   10 mg at 10/14/19 7517   ferrous sulfate tablet 325 mg  325 mg Oral Bess Harvest, NP   325 mg at 10/14/19 0916   insulin aspart (novoLOG) injection 0-9 Units  0-9 Units Subcutaneous TID WC Sharma Covert, MD   1 Units at 10/14/19 0603   insulin aspart (novoLOG) injection 6 Units  6 Units Subcutaneous TID WC Sharma Covert, MD   6 Units at 10/14/19 0017   insulin glargine (LANTUS) injection 14 Units  14 Units Subcutaneous Daily Sharma Covert, MD   14 Units at 10/14/19 4944   losartan (COZAAR) tablet 50 mg  50 mg Oral Daily Patrecia Pour, NP   50 mg at 10/14/19 9675   magnesium hydroxide (MILK OF MAGNESIA) suspension 30 mL  30 mL Oral Daily PRN Patrecia Pour, NP       metFORMIN (GLUCOPHAGE) tablet 1,000 mg  1,000 mg Oral BID WC Patrecia Pour, NP   1,000 mg at 10/14/19 0904   metoprolol tartrate (LOPRESSOR) tablet 100 mg  100 mg Oral BID Patrecia Pour, NP   100 mg at 10/14/19 9163   mirtazapine (REMERON) tablet 15 mg  15 mg Oral QHS Patrecia Pour, NP   15 mg at 10/13/19 2038   nicotine (NICODERM CQ - dosed in mg/24 hours) patch 21 mg  21 mg Transdermal Daily Patrecia Pour, NP       ondansetron [facility]) tablet 4 mg  4 mg Oral Q8H PRN Patrecia Pour, NP       paliperidone (INVEGA) 24 hr tablet 6 mg  6 mg Oral Daily Patrecia Pour, NP   6 mg at 10/14/19 0904   pantoprazole (PROTONIX) EC tablet 40 mg  40 mg Oral BID Patrecia Pour, NP   40 mg at 10/14/19 0903   pravastatin (PRAVACHOL) tablet 40 mg  40 mg Oral q1800 Patrecia Pour, NP   40 mg at 10/13/19 1735   senna-docusate (Senokot-S) tablet 1 tablet  1 tablet Oral QHS PRN Patrecia Pour, NP       sertraline (ZOLOFT) tablet 100 mg  100 mg Oral Daily  Patrecia Pour, NP   100 mg at 10/14/19 7588   spironolactone (ALDACTONE) tablet 50 mg  50 mg Oral Daily Patrecia Pour, NP   50 mg at 10/14/19 3254   thiamine tablet 100 mg  100 mg Oral Daily Patrecia Pour, NP   100 mg  at 10/14/19 9826    Lab Results:  Results for orders placed or performed during the hospital encounter of 10/12/19 (from the past 48 hour(s))  Glucose, capillary     Status: Abnormal   Collection Time: 10/12/19  7:41 PM  Result Value Ref Range   Glucose-Capillary 218 (H) 70 - 99 mg/dL    Comment: Glucose reference range applies only to samples taken after fasting for at least 8 hours.  Glucose, capillary     Status: Abnormal   Collection Time: 10/13/19  6:19 AM  Result Value Ref Range   Glucose-Capillary 165 (H) 70 - 99 mg/dL    Comment: Glucose reference range applies only to samples taken after fasting for at least 8 hours.  Glucose, capillary     Status: Abnormal   Collection Time: 10/13/19 11:40 AM  Result Value Ref Range   Glucose-Capillary 160 (H) 70 - 99 mg/dL    Comment: Glucose reference range applies only to samples taken after fasting for at least 8 hours.  Glucose, capillary     Status: Abnormal   Collection Time: 10/13/19  4:58 PM  Result Value Ref Range   Glucose-Capillary 195 (H) 70 - 99 mg/dL    Comment: Glucose reference range applies only to samples taken after fasting for at least 8 hours.  Glucose, capillary     Status: Abnormal   Collection Time: 10/13/19  8:15 PM  Result Value Ref Range   Glucose-Capillary 162 (H) 70 - 99 mg/dL    Comment: Glucose reference range applies only to samples taken after fasting for at least 8 hours.   *Note: Due to a large number of results and/or encounters for the requested time period, some results have not been displayed. A complete set of results can be found in Results Review.    Blood Alcohol level:  Lab Results  Component Value Date   ETH <10 10/10/2019   ETH <10 41/58/3094    Metabolic Disorder Labs: Lab Results  Component Value Date   HGBA1C 7.2 (A) 08/19/2019   MPG 165.68 06/04/2019   MPG 225.95 09/15/2018   Lab Results  Component Value Date   PROLACTIN 11.6 09/15/2018   Lab Results  Component Value  Date   CHOL 86 06/05/2019   TRIG 118 06/05/2019   HDL 28 (L) 06/05/2019   CHOLHDL 3.1 06/05/2019   VLDL 24 06/05/2019   LDLCALC 34 06/05/2019   LDLCALC 88 09/15/2018    Physical Findings: AIMS:  , ,  ,  ,    CIWA:    COWS:     Musculoskeletal: Strength & Muscle Tone: within normal limits Gait & Station: normal Patient leans: N/A  Psychiatric Specialty Exam: Physical Exam Vitals and nursing note reviewed.  Constitutional:      Appearance: Normal appearance.  HENT:     Head: Normocephalic and atraumatic.  Pulmonary:     Effort: Pulmonary effort is normal.  Neurological:     General: No focal deficit present.     Mental Status: She is alert and oriented to person, place, and time.     Review of Systems  Blood pressure (!) 143/83, pulse (!) 107, temperature 98.6 F (37  C), temperature source Oral, resp. rate 18, height _0  (1.651 m), weight 68.5 kg, SpO2 100 %.Body mass index is 25.13 kg/m.  General Appearance: Casual  Eye Contact:  Minimal  Speech:  Normal Rate  Volume:  Decreased  Mood:  Dysphoric and Irritable  Affect:  Congruent  Thought Process:  Goal Directed and Descriptions of Associations: Circumstantial  Orientation:  Full (Time, Place, and Person)  Thought Content:  Paranoid Ideation  Suicidal Thoughts:  No  Homicidal Thoughts:  No  Memory:  Immediate;   Poor Recent;   Poor Remote;   Poor  Judgement:  Impaired  Insight:  Lacking  Psychomotor Activity:  Normal  Concentration:  Concentration: Fair and Attention Span: Fair  Recall:  [employer] of Knowledge:  Fair  Language:  Fair  Akathisia:  Negative  Handed:  Right  AIMS (if indicated):     Assets:  Desire for Improvement Resilience  ADL's:  Intact  Cognition:  WNL  Sleep:  Number of Hours: 7     Treatment Plan Summary: Daily contact with patient to assess and evaluate symptoms and progress in treatment, Medication management and Plan : Patient is seen and examined.  Patient is a  54 year old female with the above-stated past psychiatric history who is seen in follow-up.   Diagnosis: 1.  Schizophrenia versus schizoaffective disorder; bipolar type 2.  Poorly controlled diabetes mellitus type 2 3.  Hypertension 4.  Hyperlipidemia. 5.  GERD 6.  Constipation  Pertinent findings on examination today: 1.  Continue resistance to discuss because of noncompliance with medications as well as discussing her underlying psychiatric disorder. 2.  Continued concern for underlying paranoia. 3.  Blood sugar still elevated  Plan: 1.  Continue albuterol HFA as needed for wheezing or shortness of breath. 2.  Continue amlodipine 10 mg p.o. daily for hypertension. 3.  Continue checking blood sugar 4 times daily. 4.  Continue ferrous sulfate 325 mg p.o. q. oh day for anemia. 5.  Continue sliding scale insulin. 6.  Continue insulin NovoLog 6 units 3 times daily AC for diabetes mellitus. 7.  Increase Lantus insulin to 14 units subcu daily for diabetes mellitus. 8.  Continue Metformin at 1000 mg p.o. twice daily for diabetes mellitus. 9.  Continue metoprolol tartrate 100 mg p.o. twice daily for hypertension. 10.  Continue mirtazapine 15 mg p.o. nightly for mood and anxiety. 11.  Continue Zofran 4 mg p.o. every 8 hours as needed nausea and vomiting. 12.  Continue oral paliperidone 6 mg p.o. daily for psychosis. 13.  Continue Protonix 40 mg p.o. daily for gastric protection. 14.  Continue pravastatin 40 mg p.o. daily for hyperlipidemia. 15.  Continue Senokot as needed for constipation. 16.  Continue Zoloft 100 mg p.o. daily for anxiety and depression. 17.  Continue spironolactone 50 mg p.o. daily for hypertension. 18.  Get metabolic panel in a.m. tomorrow to follow renal function and potassium given her history of renal failure as well as taking spironolactone and the potential of elevation of potassium. 19.  Disposition planning-in progress.  Sharma Covert, MD 10/14/2019, 11:12  AM

## 2019-10-14 NOTE — [facility] Group Notes (Signed)
LCSW Group Therapy Notes  Type of Therapy and Topic: Group Therapy: Healthy Vs. Unhealthy Coping Strategies  Date and Time: 10/14/19 at 1:30pm  Participation Level: [facility] PARTICIPATION LEVEL: Active  Description of Group:  In this group, patients will be encouraged to explore their healthy and unhealthy coping strategics. Coping strategies are actions that we take to deal with stress, problems, or uncomfortable emotions in our daily lives. Each patient will be challenged to read some scenarios and discuss the unhealthy and healthy coping strategies within those scenarios. Also, each patient will be challenged to describe current healthy and unhealthy strategies that they use in their own lives and discuss the outcomes and barriers to those strategies. This group will be process-oriented, with patients participating in exploration of their own experiences as well as giving and receiving support and challenge from other group members.  Therapeutic Goals: 1. Patient will identify personal healthy and unhealthy coping strategies. 2. Patient will identify healthy and unhealthy coping strategies, in others, through scenarios.  3. Patient will identify expected outcomes of healthy and unhealthy coping strategies. 4. Patient will identify barriers to using healthy coping strategies.   Summary of Patient Progress: Patient was engaged in group and was able to appropriately interact with her peers. This patient reported that she smokes cigarettes as an unhealthy coping skill and that she writes prayers in a journal as a healthy coping skill.    Therapeutic Modalities:  Cognitive Behavioral Therapy Solution Focused Therapy Motivational Interviewing    Darletta Moll MSW, [place] Worker  [facility]

## 2019-10-14 NOTE — [facility] Group Notes (Signed)
Adult Psychoeducational Group Note  Date:  10/14/2019 Time:  9:31 PM  Group Topic/Focus:  Wrap-Up Group:   The focus of this group is to help patients review their daily goal of treatment and discuss progress on daily workbooks.  Participation Level:  Active  Participation Quality:  Appropriate and Attentive  Affect:  Appropriate  Cognitive:  Alert  Insight: Good  Engagement in Group:  Engaged  Modes of Intervention:  Discussion  Additional Comments:    Dalene Carrow 10/14/2019, 9:31 PM

## 2019-10-14 NOTE — Progress Notes (Signed)
Pt CBG was 47 at 2015 , protocol followed and pt last CBG- 99 at 2045    10/14/19 2100  Psych Admission Type (Psych Patients Only)  Admission Status Involuntary  Psychosocial Assessment  Patient Complaints None  Eye Contact Fair  Facial Expression Anxious  Affect Preoccupied  Speech Soft  Interaction Assertive  Motor Activity Slow  Appearance/Hygiene Disheveled  Behavior Characteristics Cooperative  Mood Pleasant  Aggressive Behavior  Effect No apparent injury  Thought Horticulturist, commercial of ideas  Content Paranoia  Delusions Grandeur;Persecutory  Perception Hallucinations  Hallucination Auditory  Judgment Impaired  Confusion WDL  Danger to Self  Current suicidal ideation? Denies  Danger to Others  Danger to Others None reported or observed

## 2019-10-14 NOTE — Progress Notes (Signed)
DAR NOTE: Pt present with bright affect and calm mood in the unit. Pt denies physical pain, took all her meds as scheduled. Pt's safety ensured with 15 minute and environmental checks. Pt currently denies SI/HI and A/V hallucinations. Pt verbally agrees to seek staff if SI/HI or A/VH occurs and to consult with staff before acting on these thoughts. Will continue POC.

## 2019-10-15 LAB — GLUCOSE, CAPILLARY
Glucose-Capillary: 120 mg/dL — ABNORMAL HIGH (ref 70–99)
Glucose-Capillary: 207 mg/dL — ABNORMAL HIGH (ref 70–99)
Glucose-Capillary: 339 mg/dL — ABNORMAL HIGH (ref 70–99)
Glucose-Capillary: 112 mg/dL — ABNORMAL HIGH (ref 70–99)

## 2019-10-15 LAB — COMPREHENSIVE METABOLIC PANEL
ALT: 11 U/L (ref 0–44)
AST: 11 U/L — ABNORMAL LOW (ref 15–41)
Albumin: 4.4 g/dL (ref 3.5–5.0)
Alkaline Phosphatase: 83 U/L (ref 38–126)
Anion gap: 9 (ref 5–15)
BUN: 27 mg/dL — ABNORMAL HIGH (ref 6–20)
CO2: 24 mmol/L (ref 22–32)
Calcium: 9.8 mg/dL (ref 8.9–10.3)
Chloride: 104 mmol/L (ref 98–111)
Creatinine, Ser: 1.19 mg/dL — ABNORMAL HIGH (ref 0.44–1.00)
GFR calc Af Amer: >60 mL/min (ref >60)
GFR calc non Af Amer: 53 mL/min — ABNORMAL LOW (ref >60)
Glucose, Bld: 163 mg/dL — ABNORMAL HIGH (ref 70–99)
Potassium: 4.6 mmol/L (ref 3.5–5.1)
Sodium: 137 mmol/L (ref 135–145)
Total Bilirubin: 0.3 mg/dL (ref 0.3–1.2)
Total Protein: 7.5 g/dL (ref 6.5–8.1)

## 2019-10-15 MED ORDER — INSULIN ASPART 100 UNIT/ML ~~LOC~~ SOLN
3.0000 [IU] | Freq: Three times a day (TID) | SUBCUTANEOUS | Status: DC
  Administered 2019-10-15 – 2019-10-16 (×3): 3 [IU] via SUBCUTANEOUS

## 2019-10-15 MED ORDER — PALIPERIDONE ER 6 MG PO TB24
6.0000 mg | ORAL_TABLET | Freq: Every day | ORAL | Status: DC
  Administered 2019-10-15: 6 mg via ORAL
  Filled 2019-10-15 (×2): qty 1

## 2019-10-15 NOTE — Progress Notes (Signed)
[facility] MD Progress Note  10/15/2019 11:36 AM [name]  MRN:  [id number] Subjective:  Patient is a 54 year old female with a reported past psychiatric history significant for schizoaffective disorder who was originally brought to the emergency department on 10/09/2019 secondary to being off her psychiatric medications for quite a while, being psychotic, agitated.  Objective: Patient is seen and examined.  Patient is a 54 year old female with the above-stated past psychiatric history is seen in follow-up.  From a psychiatric perspective she is done fine.  She is been compliant with her medications.  She has denied any auditory or visual hallucinations.  She denied any suicidal or homicidal ideation.  Her mood appears to be stable.  Her blood sugar did get low last evening as well as this morning.  I reduced her standing insulin dose.  I spoke to her mother this morning, she felt like she sounded normal on the phone.  We have discussed the potential discharge in the next 1 to 2 days.  She did not sleep well according to the nursing notes, but the patient stated she did sleep well.  I did tell her that I was switching the paliperidone oral version to bedtime instead of daytime to see if that would help her sleep better.  Her blood sugar this morning at 6 AM was 120.  No other new laboratories.  Her blood pressure was 118/83.  Pulse was 92.  She is afebrile.  Pulse oximetry on room air was 100%.  Nurses notes reflect she slept 4.75 hours last night.  Principal Problem: <principal problem not specified> Diagnosis: Active Problems:   Schizoaffective disorder, bipolar type ([place])  Total Time spent with patient: 20 minutes  Past Psychiatric History: See admission H&P  Past Medical History:  Past Medical History:  Diagnosis Date  . Alcohol abuse   . Anemia, iron deficiency   . Arteriosclerotic cardiovascular disease (ASCVD)    Minimal at cath in [facility].stress nuclear study in 8/08 with nl  EF; neg stress echo in 2010  . Community acquired pneumonia 01/03/10, 05/2010, 04/2012   2011; with pleural effusion-[facility] acute resp failure; intubated in Jan 2014 (HMPV pneumonia)  . Depression   . Diabetes mellitus, type 2 ([place]) 2000   Onset in 2000; no insulin  . Diarrhea 10/14/2013   Started 10/10/13, improved with Imodium.   . Diastolic dysfunction    grade 2 per echo 2011  . Dysphagia   . Gastroesophageal reflux disease    Schatzki's ring  . History of alcohol abuse 07/22/2007   Qualifier: Diagnosis of  By: Lenn Cal    . Hyperlipidemia   . Hypertension `   during treatment with Geodon  . Hypokalemia 12/25/2013  . Left knee pain 08/25/2014  . Obesity   . Oral candidiasis 05/16/2017  . PTSD (post-traumatic stress disorder)   . Pulmonary hypertension ([place]) 05/02/2012   Patient needs repeat echo in 06/2012   . Schizoaffective disorder    requiring multiple psychiatric admissions  . Viral URI 05/12/2013  . Viral wart on finger 10/08/2016  . Vision changes 08/12/2014    Past Surgical History:  Procedure Laterality Date  . COLONOSCOPY  01/2006   internal hemorrhoids  . COLONOSCOPY  01/10/2012   Dr. Rourk:Single anal canal hemorrhoidal tag likely source of  trivial hematochezia; right-sided colonic diverticulosis  . DILATION AND CURETTAGE, DIAGNOSTIC / THERAPEUTIC  1992  . ESOPHAGEAL DILATION N/A 08/18/2015   Procedure: ESOPHAGEAL DILATION;  Surgeon: Daneil Dolin, MD;  Location: [facility] ENDO SUITE;  Service: Endoscopy;  Laterality: N/A;  . ESOPHAGOGASTRODUODENOSCOPY  09/16/08   Dr. Trevor Iha hiatal hernia/excoriations involving the cardia and mucosa consistent with trauma, antral erosions  of linear petechiae ? gastritis versus early gastric antral vascular  ectasia.Marland Kitchen biopsy showed reactive gastropathy. No H. pylori.  . ESOPHAGOGASTRODUODENOSCOPY  09/2007   Dr. Evalee Mutton ring, dilated to 41 French Maloney dilator, small hiatal hernia, antral erosions, biopsies reactive  gastropathy.  . ESOPHAGOGASTRODUODENOSCOPY (EGD) WITH PROPOFOL N/A 12/17/2013   GBT:DVVOHYW antral erosions and petechiae. Small hiatal hernia. No endoscopic explanation for patient's symptoms  . ESOPHAGOGASTRODUODENOSCOPY (EGD) WITH PROPOFOL N/A 08/18/2015   Dr. Gala Romney: normal exam, s/p esophageal dilation  . ESOPHAGOGASTRODUODENOSCOPY (EGD) WITH PROPOFOL N/A 06/18/2019   Procedure: ESOPHAGOGASTRODUODENOSCOPY (EGD) WITH PROPOFOL;  Surgeon: Daneil Dolin, MD;  Location: [facility] ENDO SUITE;  Service: Endoscopy;  Laterality: N/A;  2:30pm - office moved to 10:15  . SAVORY DILATION  07/17/2011   [facility] sedation-->distal esophageal stricture s/p dilation, chronic gastritis, multiple ulcers in stomach. no h.pylori   Family History:  Family History  Problem Relation Age of Onset  . Hypertension Mother   . Stroke Father        deceased at age 75  . Colon cancer Other   . Heart disease Sister   . Diabetes Other   . High Cholesterol Other   . Arthritis Other   . Anesthesia problems Neg Hx   . Hypotension Neg Hx   . Malignant hyperthermia Neg Hx   . Pseudochol deficiency Neg Hx    Family Psychiatric  History: See admission H&P Social History:  Social History   Substance and Sexual Activity  Alcohol Use No  . Alcohol/week: 0.0 standard drinks   Comment: hx of ETOH abuse     Social History   Substance and Sexual Activity  Drug Use No    Social History   Socioeconomic History  . Marital status: Single    Spouse name: Not on file  . Number of children: 2  . Years of education: some colge  . Highest education level: Not on file  Occupational History  . Occupation: Disability  . Occupation: UNEMPLOYED    Employer: UNEMPLOYED  Tobacco Use  . Smoking status: Current Some Day Smoker    Packs/day: 1.00    Years: 30.00    Pack years: 30.00    Types: Cigarettes    Start date: 05/18/2012  . Smokeless tobacco: Never Used  . Tobacco comment: 1 PPD  Vaping Use  . Vaping Use: Never used   Substance and Sexual Activity  . Alcohol use: No    Alcohol/week: 0.0 standard drinks    Comment: hx of ETOH abuse  . Drug use: No  . Sexual activity: Not Currently    Partners: Male    Birth control/protection: None  Other Topics Concern  . 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 Difficulty of Paying Living Expenses:   Food Insecurity:   . Worried About Charity fundraiser in the Last Year:   . Arboriculturist in the Last Year:   Transportation Needs: No Transportation Needs  .  Lack of Transportation (Medical): No  . Lack of Transportation (Non-Medical): No  Physical Activity:   . Days of Exercise per Week:   . Minutes of Exercise per Session:   Stress:   . Feeling of Stress :   Social Connections: Unknown  . Frequency of Communication with Friends and Family: More than three times a week  . Frequency of Social Gatherings with Friends and Family: Three times a week  . Attends Religious Services: Not on file  . Active Member of Clubs or Organizations: Not on file  . Attends Archivist Meetings: Not on file  . Marital Status: Not on file   Additional Social History:                         Sleep: Fair  Appetite:  Fair  Current Medications: Current Facility-Administered Medications  Medication Dose Route Frequency Provider Last Rate Last Admin  . acetaminophen (TYLENOL) tablet 650 mg  650 mg Oral Q6H PRN Patrecia Pour, NP      . albuterol (VENTOLIN HFA) 108 (90 Base) MCG/ACT inhaler 1-2 puff  1-2 puff Inhalation Q6H PRN Patrecia Pour, NP      . alum & mag hydroxide-simeth (MAALOX/MYLANTA) 200-200-20 MG/5ML  suspension 30 mL  30 mL Oral Q4H PRN Patrecia Pour, NP      . amLODipine (NORVASC) tablet 10 mg  10 mg Oral Daily Patrecia Pour, NP   10 mg at 10/15/19 0748  . ferrous sulfate tablet 325 mg  325 mg Oral Bess Harvest, NP   325 mg at 10/14/19 0916  . insulin aspart (novoLOG) injection 0-9 Units  0-9 Units Subcutaneous TID WC Sharma Covert, MD   7 Units at 10/14/19 1649  . insulin aspart (novoLOG) injection 3 Units  3 Units Subcutaneous TID WC Sharma Covert, MD      . insulin glargine (LANTUS) injection 14 Units  14 Units Subcutaneous Daily Sharma Covert, MD   14 Units at 10/15/19 0944  . losartan (COZAAR) tablet 50 mg  50 mg Oral Daily Patrecia Pour, NP   50 mg at 10/15/19 0747  . magnesium hydroxide (MILK OF MAGNESIA) suspension 30 mL  30 mL Oral Daily PRN Patrecia Pour, NP      . metFORMIN (GLUCOPHAGE) tablet 1,000 mg  1,000 mg Oral BID WC Patrecia Pour, NP   1,000 mg at 10/15/19 0748  . metoprolol tartrate (LOPRESSOR) tablet 100 mg  100 mg Oral BID Patrecia Pour, NP   100 mg at 10/15/19 0747  . mirtazapine (REMERON) tablet 15 mg  15 mg Oral QHS Patrecia Pour, NP   15 mg at 10/14/19 2219  . nicotine (NICODERM CQ - dosed in mg/24 hours) patch 21 mg  21 mg Transdermal Daily Patrecia Pour, NP      . ondansetron [facility]) tablet 4 mg  4 mg Oral Q8H PRN Patrecia Pour, NP      . paliperidone (INVEGA) 24 hr tablet 6 mg  6 mg Oral QHS Sharma Covert, MD      . pantoprazole (PROTONIX) EC tablet 40 mg  40 mg Oral BID Patrecia Pour, NP   40 mg at 10/15/19 0748  . pravastatin (PRAVACHOL) tablet 40 mg  40 mg Oral q1800 Patrecia Pour, NP   40 mg at 10/14/19 1744  . senna-docusate (Senokot-S) tablet 1 tablet  1 tablet Oral QHS PRN  Patrecia Pour, NP      . sertraline (ZOLOFT) tablet 100 mg  100 mg Oral Daily Patrecia Pour, NP   100 mg at 10/15/19 0748  . spironolactone (ALDACTONE) tablet 50 mg  50 mg Oral Daily Patrecia Pour, NP   50 mg at 10/15/19 0748  .  thiamine tablet 100 mg  100 mg Oral Daily Patrecia Pour, NP   100 mg at 10/15/19 3016    Lab Results:  Results for orders placed or performed during the hospital encounter of 10/12/19 (from the past 48 hour(s))  Glucose, capillary     Status: Abnormal   Collection Time: 10/13/19 11:40 AM  Result Value Ref Range   Glucose-Capillary 160 (H) 70 - 99 mg/dL    Comment: Glucose reference range applies only to samples taken after fasting for at least 8 hours.  Glucose, capillary     Status: Abnormal   Collection Time: 10/13/19  4:58 PM  Result Value Ref Range   Glucose-Capillary 195 (H) 70 - 99 mg/dL    Comment: Glucose reference range applies only to samples taken after fasting for at least 8 hours.  Glucose, capillary     Status: Abnormal   Collection Time: 10/13/19  8:15 PM  Result Value Ref Range   Glucose-Capillary 162 (H) 70 - 99 mg/dL    Comment: Glucose reference range applies only to samples taken after fasting for at least 8 hours.  Glucose, capillary     Status: Abnormal   Collection Time: 10/14/19 12:07 PM  Result Value Ref Range   Glucose-Capillary 102 (H) 70 - 99 mg/dL    Comment: Glucose reference range applies only to samples taken after fasting for at least 8 hours.  Glucose, capillary     Status: Abnormal   Collection Time: 10/14/19  4:45 PM  Result Value Ref Range   Glucose-Capillary 311 (H) 70 - 99 mg/dL    Comment: Glucose reference range applies only to samples taken after fasting for at least 8 hours.  Glucose, capillary     Status: Abnormal   Collection Time: 10/14/19  8:15 PM  Result Value Ref Range   Glucose-Capillary 47 (L) 70 - 99 mg/dL    Comment: Glucose reference range applies only to samples taken after fasting for at least 8 hours.   Comment 1 Notify RN   Glucose, capillary     Status: Abnormal   Collection Time: 10/14/19  8:30 PM  Result Value Ref Range   Glucose-Capillary 46 (L) 70 - 99 mg/dL    Comment: Glucose reference range applies only to  samples taken after fasting for at least 8 hours.   Comment 1 Notify RN   Glucose, capillary     Status: None   Collection Time: 10/14/19  8:48 PM  Result Value Ref Range   Glucose-Capillary 99 70 - 99 mg/dL    Comment: Glucose reference range applies only to samples taken after fasting for at least 8 hours.  Glucose, capillary     Status: Abnormal   Collection Time: 10/15/19  6:01 AM  Result Value Ref Range   Glucose-Capillary 120 (H) 70 - 99 mg/dL    Comment: Glucose reference range applies only to samples taken after fasting for at least 8 hours.   *Note: Due to a large number of results and/or encounters for the requested time period, some results have not been displayed. A complete set of results can be found in Results Review.    Blood  Alcohol level:  Lab Results  Component Value Date   ETH <10 10/10/2019   ETH <10 16/01/9603    Metabolic Disorder Labs: Lab Results  Component Value Date   HGBA1C 7.2 (A) 08/19/2019   MPG 165.68 06/04/2019   MPG 225.95 09/15/2018   Lab Results  Component Value Date   PROLACTIN 11.6 09/15/2018   Lab Results  Component Value Date   CHOL 86 06/05/2019   TRIG 118 06/05/2019   HDL 28 (L) 06/05/2019   CHOLHDL 3.1 06/05/2019   VLDL 24 06/05/2019   LDLCALC 34 06/05/2019   LDLCALC 88 09/15/2018    Physical Findings: AIMS:  , ,  ,  ,    CIWA:    COWS:     Musculoskeletal: Strength & Muscle Tone: within normal limits Gait & Station: normal Patient leans: N/A  Psychiatric Specialty Exam: Physical Exam Vitals and nursing note reviewed.  Constitutional:      Appearance: Normal appearance.  HENT:     Head: Normocephalic and atraumatic.  Pulmonary:     Effort: Pulmonary effort is normal.  Neurological:     General: No focal deficit present.     Mental Status: She is alert and oriented to person, place, and time.     Review of Systems  Blood pressure 118/83, pulse 92, temperature 98 F (36.7 C), temperature source Oral, resp.  rate 18, height _0  (1.651 m), weight 68.5 kg, SpO2 (!) 74 %.Body mass index is 25.13 kg/m.  General Appearance: Casual  Eye Contact:  Fair  Speech:  Normal Rate  Volume:  Normal  Mood:  Euthymic  Affect:  Congruent  Thought Process:  Coherent and Descriptions of Associations: Circumstantial  Orientation:  Full (Time, Place, and Person)  Thought Content:  Logical  Suicidal Thoughts:  No  Homicidal Thoughts:  No  Memory:  Immediate;   Fair Recent;   Fair Remote;   Fair  Judgement:  Intact  Insight:  Fair  Psychomotor Activity:  Normal  Concentration:  Concentration: Fair and Attention Span: Fair  Recall:  [employer] of Knowledge:  Fair  Language:  Fair  Akathisia:  Negative  Handed:  Right  AIMS (if indicated):     Assets:  Desire for Improvement Housing Resilience Social Support  ADL's:  Intact  Cognition:  WNL  Sleep:  Number of Hours: 4.75     Treatment Plan Summary: Daily contact with patient to assess and evaluate symptoms and progress in treatment, Medication management and Plan : Patient is seen and examined.  Patient is a 54 year old female with the above-stated past psychiatric history who is seen in follow-up.   Diagnosis: 1.  Schizophrenia versus schizoaffective disorder; bipolar type 2.  Poorly controlled diabetes mellitus type 2 3.  Hypertension 4.  Hyperlipidemia. 5.  GERD 6.  Constipation  Pertinent findings on examination today: 1.  Patient denies any auditory or visual hallucinations. 2.  Patient denied any suicidal or homicidal ideation. 3.  Patient's blood sugar was low this a.m., and so I cut back on her standing meal insulin. 4.  Discussed with patient's mother her case. 5.  Sleep was not great last night.  Plan: 1.  Continue albuterol HFA as needed for wheezing or shortness of breath. 2.  Continue amlodipine 10 mg p.o. daily for hypertension. 3.  Continue checking blood sugar 4 times daily. 4.  Continue ferrous sulfate 325 mg p.o. q. oh  day for anemia. 5.  Continue sliding scale insulin. 6.  Decrease insulin  NovoLog to 3 units 3 times daily AC for diabetes mellitus. 7.  Decrease Lantus insulin to 12 units subcu daily for diabetes mellitus. 8.  Continue Metformin at 1000 mg p.o. twice daily for diabetes mellitus. 9.  Continue metoprolol tartrate 100 mg p.o. twice daily for hypertension. 10.  Continue mirtazapine 15 mg p.o. nightly for mood and anxiety. 11.  Continue Zofran 4 mg p.o. every 8 hours as needed nausea and vomiting. 12.  Continue oral paliperidone 6 mg but changed to p.o. nightly for psychosis. 13.  Continue Protonix 40 mg p.o. daily for gastric protection. 14.  Continue pravastatin 40 mg p.o. daily for hyperlipidemia. 15.  Continue Senokot as needed for constipation. 16.  Continue Zoloft 100 mg p.o. daily for anxiety and depression. 17.  Continue spironolactone 50 mg p.o. daily for hypertension. 18.    Reorder metabolic panel in a.m. tomorrow to follow renal function and potassium given her history of renal failure as well as taking spironolactone and the potential of elevation of potassium. 19.  Disposition planning-in progress.  Sharma Covert, MD 10/15/2019, 11:36 AM

## 2019-10-15 NOTE — Progress Notes (Signed)
   10/15/19 2200  Psych Admission Type (Psych Patients Only)  Admission Status Involuntary  Psychosocial Assessment  Patient Complaints None  Eye Contact Fair  Facial Expression Anxious  Affect Preoccupied  Speech Soft  Interaction Assertive  Motor Activity Slow  Appearance/Hygiene Disheveled  Behavior Characteristics Cooperative  Mood Anxious;Pleasant  Aggressive Behavior  Effect No apparent injury  Thought Horticulturist, commercial of ideas  Content Paranoia  Delusions Grandeur;Persecutory  Perception Hallucinations  Hallucination Auditory  Judgment Impaired  Confusion None  Danger to Self  Current suicidal ideation? Denies  Danger to Others  Danger to Others None reported or observed  Pt. Met laying in her bed, she states that she is "sad" but declines to elaborate.  Pt. Denies any physical discomfort.  Pt. Is isolative to her room tonight, she is medicated as ordered and provided with support and encouragement.

## 2019-10-15 NOTE — Progress Notes (Signed)
Pt visible in hallway and at nursing station on initial contact. Presents with flat affect but brightens up and engaged on interactions. Denies SI, HI, AVH and pain when assessed. Remains anxious and preoccupied but is pleasant. Compliant with medications and CBG monitoring on approach. CBG level elevated this shift and is aware of needed changes in her diet. Ate a piece of chicken breast and salad this for lunch.  Emotional support offered. All medications administered as ordered with verbal education and effects monitored. Q 15 minutes safety checks maintained without incident.  Pt compliant with lab draw this evening. Remains safe on and off unit. Denies concerns at this time.

## 2019-10-15 NOTE — Progress Notes (Signed)
Recreation Therapy Notes  Date: 7.1.21 Time: 1000 Location: 500 Hall Dayroom  Group Topic: Communication, Team Building, Problem Solving  Goal Area(s) Addresses:  Patient will effectively work with peer towards shared goal.  Patient will identify skill used to make activity successful.  Patient will identify how skills used during activity can be used to reach post d/c goals.   Behavioral Response: Minimal  Intervention: STEM Activity   Activity: In team's, using 20 small plastic cups, patients were asked to build the tallest free standing tower possible.    Education: Education officer, community, Dentist.   Education Outcome: Acknowledges education/In group clarification offered/Needs additional education.   Clinical Observations/Feedback:  Pt was quiet and attentive to what her partners were trying to come up with.  Pt watched them for the most part but engaged when prompted by peers.  Pt did express the activity was hard because the cups wouldn't stay up. Pt was pleasant throughout the group session.    Victorino Sparrow, LRT/CTRS    Ria Comment, Kathalene Sporer A 10/15/2019 11:00 AM

## 2019-10-16 LAB — GLUCOSE, CAPILLARY: Glucose-Capillary: 131 mg/dL — ABNORMAL HIGH (ref 70–99)

## 2019-10-16 MED ORDER — FERROUS SULFATE 325 (65 FE) MG PO TABS: 325.0000 mg | ORAL_TABLET | ORAL | 2 refills | Status: DC

## 2019-10-16 MED ORDER — SERTRALINE HCL 100 MG PO TABS: 100.0000 mg | ORAL_TABLET | Freq: Every day | ORAL | 0 refills | Status: DC

## 2019-10-16 MED ORDER — SENNOSIDES-DOCUSATE SODIUM 8.6-50 MG PO TABS: 1.0000 | ORAL_TABLET | Freq: Every evening | ORAL | 1 refills | Status: DC | PRN

## 2019-10-16 MED ORDER — BENZTROPINE MESYLATE 1 MG PO TABS
ORAL_TABLET | ORAL | 0 refills | Status: DC
Start: 2019-10-16 — End: 2019-10-16

## 2019-10-16 MED ORDER — INSULIN GLARGINE 100 UNIT/ML ~~LOC~~ SOLN: 14.0000 [IU] | Freq: Every day | SUBCUTANEOUS | 0 refills | Status: DC

## 2019-10-16 MED ORDER — MIRTAZAPINE 15 MG PO TABS: 15.0000 mg | ORAL_TABLET | Freq: Every day | ORAL | 0 refills | Status: DC

## 2019-10-16 MED ORDER — PALIPERIDONE ER 6 MG PO TB24: 6.0000 mg | ORAL_TABLET | Freq: Every day | ORAL | 0 refills | Status: DC

## 2019-10-16 NOTE — [facility] Suicide Risk Assessment (Signed)
[facility] Discharge Suicide Risk Assessment   Principal Problem: <principal problem not specified> Discharge Diagnoses: Active Problems:   Schizoaffective disorder, bipolar type ([place])   Total Time spent with patient: 20 minutes  Musculoskeletal: Strength & Muscle Tone: within normal limits Gait & Station: normal Patient leans: N/A  Psychiatric Specialty Exam: Review of Systems  All other systems reviewed and are negative.   Blood pressure 119/80, pulse 79, temperature 97.7 F (36.5 C), temperature source Oral, resp. rate 18, height 5\' 5"  (1.651 m), weight 68.5 kg, SpO2 100 %.Body mass index is 25.13 kg/m.  General Appearance: Casual  Eye Contact::  Fair  Speech:  Normal Rate409  Volume:  Normal  Mood:  Anxious  Affect:  Congruent  Thought Process:  Coherent and Descriptions of Associations: Intact  Orientation:  Full (Time, Place, and Person)  Thought Content:  Delusions  Suicidal Thoughts:  No  Homicidal Thoughts:  No  Memory:  Immediate;   Fair Recent;   Fair Remote;   Fair  Judgement:  Intact  Insight:  Fair  Psychomotor Activity:  Normal  Concentration:  Fair  Recall:  [employer] of Knowledge:Fair  Language: Fair  Akathisia:  Negative  Handed:  Right  AIMS (if indicated):     Assets:  Desire for Improvement Financial Resources/Insurance Housing Resilience Social Support Transportation  Sleep:  Number of Hours: 4.75  Cognition: WNL  ADL's:  Intact   Mental Status Per Nursing Assessment::   On Admission:  NA  Demographic Factors:  Divorced or widowed, Low socioeconomic status and Unemployed  Loss Factors: NA  Historical Factors: Impulsivity  Risk Reduction Factors:   Living with another person, especially a relative and Positive social support  Continued Clinical Symptoms:  Schizophrenia:   Paranoid or undifferentiated type  Cognitive Features That Contribute To Risk:  None    Suicide Risk:  Minimal: No identifiable suicidal ideation.   Patients presenting with no risk factors but with morbid ruminations; may be classified as minimal risk based on the severity of the depressive symptoms   Follow-up [facility] Follow up on 11/02/2019.   Specialty: Behavioral Health Why: You have a virtual appointment for therapy on 11/02/19 at 3:30 pm. You also have an appointment with Dr. Adele Schilder for medication management on 11/11/19 at 11:20 am.  These are VIRTUAL appointments.  Contact information: [place] [phone]              Plan Of Care/Follow-up recommendations:  Activity:  ad lib  Sharma Covert, MD 10/16/2019, 9:09 AM

## 2019-10-16 NOTE — Progress Notes (Signed)
D: Pt A & O X 3. Denies SI, HI, AVH and pain at this time, however pt was observed whispering to self at intervals during this shift.  D/C home as ordered. Picked up in lobby by her father.  A: D/C instructions reviewed with pt including prescriptions and follow up appointment; compliance encouraged. All belongings from locker 46 returned to pt at time of departure. Scheduled medications administered with verbal education and effects monitored. Safety checks maintained without incident till time of d/c.  R: Pt receptive to care. Compliant with medications when offered. Denies adverse drug reactions when assessed. Verbalized understanding related to d/c instructions. Signed belonging sheet in agreement with items received from locker. Ambulatory with a steady gait. Appears to be in no physical distress at time of departure.

## 2019-10-16 NOTE — Progress Notes (Signed)
  [facility] Adult Case Management Discharge Plan :  Will you be returning to the same living situation after discharge:  Yes,  to home. At discharge, do you have transportation home?: Yes,  mother to pick up. Do you have the ability to pay for your medications: Yes,  has insurance.  Release of information consent forms completed and in the chart;  Patient's signature needed at discharge.  Patient to Follow up at:  Follow-up Information    [facility] Follow up on 11/02/2019.   Specialty: Behavioral Health Why: You have a virtual appointment for therapy on 11/02/19 at 3:30 pm. You also have an appointment with Dr. Adele Schilder for medication management on 11/11/19 at 11:20 am.  These are VIRTUAL appointments.  Contact information: [address] [place] [phone]              Next level of care provider has access to [place] and Suicide Prevention discussed: Yes,  with mother.     Has patient been referred to the Quitline?: Patient refused referral  Patient has been referred for addiction treatment: Pt. refused referral  Vassie Moselle, [place] 10/16/2019, 10:03 AM

## 2019-10-16 NOTE — Discharge Summary (Signed)
Physician Discharge Summary Note  Patient:  [name] is an 54 y.o., female MRN:  [id number] DOB:  09/21/1965  Patient phone:  [phone] (home)   Patient address:   [address] [place] [place] [address],   Total Time spent with patient: Greater than 30 minutes  Date of Admission:  10/12/2019  Date of Discharge: 10-16-19  Reason for Admission: Homicidal gestures, suicidal ideations, hysterically crying &  agitated.  Principal Problem: Schizoaffective disorder, bipolar type [facility])  Discharge Diagnoses: Patient Active Problem List   Diagnosis Date Noted  . Schizoaffective disorder, bipolar type ([place]) [F25.0] 08/26/2017    Priority: High  . Bipolar I disorder, most recent episode (or current) manic ([place]) [F31.10] 09/14/2018    Priority: Medium  . Weight loss [R63.4] 06/15/2019  . Yeast infection [B37.9] 06/11/2019  . Lithium toxicity [T56.891A] 06/03/2019  . Warts [B07.9] 02/19/2019  . Immunity status testing [Z01.84] 04/17/2017  . Nuclear sclerosis of both eyes [H25.13] 04/02/2016  . 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 Encounter for health education [Z71.9] 10/24/2014  . IIH (idiopathic intracranial hypertension) [G93.2] 08/12/2014  . Seasonal allergies [J30.2] 02/23/2014  . Daytime hypersomnolence [G47.19] 12/09/2012  . Insomnia [G47.00] 11/06/2012  . COPD (chronic obstructive pulmonary disease) ([place]) [J44.9] 10/20/2012  . GERD (gastroesophageal reflux disease) [K21.9] 10/15/2012  . Preventative health care [Z00.00] 10/15/2012  . Pulmonary hypertension ([place]) [I27.20] 05/02/2012  . Diastolic heart failure ([place]) [I50.30] 02/07/2012  . IDA (iron deficiency anemia) [D50.9] 11/06/2011  . Nausea & vomiting [R11.2] 07/06/2011  . Fatigue [R53.83] 05/17/2011  . Arteriosclerotic cardiovascular disease (ASCVD) [I25.10]   .  TOBACCO ABUSE [F17.200] 09/27/2009  . Back pain [M54.9] 12/13/2008  . Hyperlipidemia [E78.5] 07/22/2007  . Obesity [E66.9] 07/22/2007  . Resistant hypertension [I10] 07/22/2007  . Diabetes mellitus type 2, controlled ([place]) [E11.9] 09/28/2002   Past Psychiatric History: Bipolar 1 disorder.  Past Medical History:  Past Medical History:  Diagnosis Date  . Alcohol abuse   . Anemia, iron deficiency   . Arteriosclerotic cardiovascular disease (ASCVD)    Minimal at cath in [facility].stress nuclear study in 8/08 with nl EF; neg stress echo in 2010  . Community acquired pneumonia 01/03/10, 05/2010, 04/2012   2011; with pleural effusion-[facility]osp [facility] acute resp failure; intubated in Jan 2014 (HMPV pneumonia)  . Depression   . Diabetes mellitus, type 2 ([place]) 2000   Onset in 2000; no insulin  . Diarrhea 10/14/2013   Started 10/10/13, improved with Imodium.   . Diastolic dysfunction    grade 2 per echo 2011  . Dysphagia   . Gastroesophageal reflux disease    Schatzki's ring  . History of alcohol abuse 07/22/2007   Qualifier: Diagnosis of  By: Lenn Cal    . Hyperlipidemia   . Hypertension `   during treatment with Geodon  . Hypokalemia 12/25/2013  . Left knee pain 08/25/2014  . Obesity   . Oral candidiasis 05/16/2017  . PTSD (post-traumatic stress disorder)   . Pulmonary hypertension ([place]) 05/02/2012   Patient needs repeat echo in 06/2012   . Schizoaffective disorder    requiring multiple psychiatric admissions  . Viral URI 05/12/2013  . Viral wart on finger 10/08/2016  . Vision changes 08/12/2014    Past Surgical History:  Procedure Laterality Date  . COLONOSCOPY  01/2006   internal hemorrhoids  . COLONOSCOPY  01/10/2012  Dr. Rourk:Single anal canal hemorrhoidal tag likely source of  trivial hematochezia; right-sided colonic diverticulosis  . DILATION AND CURETTAGE, DIAGNOSTIC / THERAPEUTIC  1992  . ESOPHAGEAL DILATION N/A 08/18/2015   Procedure: ESOPHAGEAL DILATION;  Surgeon:  Daneil Dolin, MD;  [facility];  Service: Endoscopy;  Laterality: N/A;  . ESOPHAGOGASTRODUODENOSCOPY  09/16/08   Dr. Trevor Iha hiatal hernia/excoriations involving the cardia and mucosa consistent with trauma, antral erosions  of linear petechiae ? gastritis versus early gastric antral vascular  ectasia.Marland Kitchen biopsy showed reactive gastropathy. No H. pylori.  . ESOPHAGOGASTRODUODENOSCOPY  09/2007   Dr. Evalee Mutton ring, dilated to 33 French Maloney dilator, small hiatal hernia, antral erosions, biopsies reactive gastropathy.  . ESOPHAGOGASTRODUODENOSCOPY (EGD) WITH PROPOFOL N/A 12/17/2013   NFA:OZHYQMV antral erosions and petechiae. Small hiatal hernia. No endoscopic explanation for patient's symptoms  . ESOPHAGOGASTRODUODENOSCOPY (EGD) WITH PROPOFOL N/A 08/18/2015   Dr. Gala Romney: normal exam, s/p esophageal dilation  . ESOPHAGOGASTRODUODENOSCOPY (EGD) WITH PROPOFOL N/A 06/18/2019   Procedure: ESOPHAGOGASTRODUODENOSCOPY (EGD) WITH PROPOFOL;  Surgeon: Daneil Dolin, MD;  Location: [facility];  Service: Endoscopy;  Laterality: N/A;  2:30pm - office moved to 10:15  . SAVORY DILATION  07/17/2011   [facility] sedation-->distal esophageal stricture s/p dilation, chronic gastritis, multiple ulcers in stomach. no h.pylori   Family History:  Family History  Problem Relation Age of Onset  . Hypertension Mother   . Stroke Father        deceased at age 10  . Colon cancer Other   . Heart disease Sister   . Diabetes Other   . High Cholesterol Other   . Arthritis Other   . Anesthesia problems Neg Hx   . Hypotension Neg Hx   . Malignant hyperthermia Neg Hx   . Pseudochol deficiency Neg Hx    Family Psychiatric  History: See H&P.  Social History:  Social History   Substance and Sexual Activity  Alcohol Use No  . Alcohol/week: 0.0 standard drinks   Comment: hx of ETOH abuse     Social History   Substance and Sexual Activity  Drug Use No    Social History   Socioeconomic History   . Marital status: Single    Spouse name: Not on file  . Number of children: 2  . Years of education: some colge  . Highest education level: Not on file  Occupational History  . Occupation: Disability  . Occupation: UNEMPLOYED    Employer: UNEMPLOYED  Tobacco Use  . Smoking status: Current Some Day Smoker    Packs/day: 1.00    Years: 30.00    Pack years: 30.00    Types: Cigarettes    Start date: 05/18/2012  . Smokeless tobacco: Never Used  . Tobacco comment: 1 PPD  Vaping Use  . Vaping Use: Never used  Substance and Sexual Activity  . Alcohol use: No    Alcohol/week: 0.0 standard drinks    Comment: hx of ETOH abuse  . Drug use: No  . Sexual activity: Not Currently    Partners: Male    Birth control/protection: None  Other Topics Concern  . 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  Difficulty of Paying Living Expenses:   Food Insecurity:   . Worried About Charity fundraiser in the Last Year:   . Arboriculturist in the Last Year:   Transportation Needs: No Transportation Needs  . Lack of Transportation (Medical): No  . Lack of Transportation (Non-Medical): No  Physical Activity:   . Days of Exercise per Week:   . Minutes of Exercise per Session:   Stress:   . Feeling of Stress :   Social Connections: Unknown  . Frequency of Communication with Friends and Family: More than three times a week  . Frequency of Social Gatherings with Friends and Family: Three times a week  . Attends Religious Services: Not on file  . Active Member of Clubs or Organizations: Not on file  . Attends Archivist Meetings: Not on file  . Marital Status:  Not on file   Hospital Course: (Per Md's admission evaluation notes): Patient is seen and examined. Patient is a 54 year old female with a reported past psychiatric history significant for schizoaffective disorder who was originally brought to the emergency department on 10/09/2019. Her son brought her to the [facility] emergency department. According to the notes the patient thought she was having a fever and that was the reason why she came to the hospital. According to the son the patient had been off her medications for approximately a year and a half. She apparently was in [place] visiting a family member. The patient had apparently told the son "we should kill them all". Patient was apparently antagonistic and profane in the emergency department. According to a telephone conversation with her primary psychiatrist (Dr Arfeen)the family had reported that the patient was cheeking her medications. At home the patient was apparently suicidal, hysterically crying, and agitated. The patient denies all this and stated that her blood pressure was running low, and that was the reason why she was here. She reportedly has a history of schizoaffective disorder with at least 10 inpatient visits and multiple suicide attempts. Apparently she had had a recent episode where she jumped from a running car and attempted to overdose on chlorine bleach. That hospitalization was in March and 2021. She had been seen at [facility] for many years and apparently did well on Geodon, but on her last psychiatric hospitalization had been switched to perphenazine. She had also been previously treated with Depakote, trazodone, temazepam, mirtazapine. Review of the electronic medical record also revealed that she had had previously lithium toxicity, and ended up having to be dialyzed. That was under [facility]. It is unclear exactly when that took place. After the lithium overdose it was stopped, and she was  switched to long-acting paliperidone injection. It apparently is due today. She apparently received this at [facility] on her most recent psychiatric hospitalization. She currently denied suicidal or homicidal ideation. She denied any auditory or visual hallucinations. She denied any sense that people were out to get her or trying to harm her. She was admitted to the hospital for evaluation and stabilization.  After the above admission evaluation, [name] presenting symptoms were noted. She was recommended for mood stabilization treatments. The medication regimen targeting those presenting symptoms were discussed & initiated with her consent. She was medicated, stabilized & discharged on the medications as listed on her discharge medication lists below. Besides the mood stabilization treatments, [place] was also enrolled & participated in the group counseling sessions being offered & held on this unit. She learned coping skills. She  also presented other significant pre-existing medical issues that required treatment. She was resumed & discharged on all her pertinent home medications for those health issues. She tolerated her treatment regimen without any adverse effects or reactions reported.  During the course of her hospitalization, the 15-minute checks were adequate to ensure patient's safety.  Patient did not display any dangerous violent or suicidal behavior on the unit.  She interacted with patients & staff appropriately, participated appropriately in the group sessions/therapies. Her medications were addressed & adjusted to meet her needs. She was recommended for outpatient follow-up care & medication management upon discharge to assure continuity of care.  At the time of discharge patient is not reporting any acute suicidal/homicidal ideations. She feels more confident about her self-care & in managing the suicidal/homicidal thoughts. She currently denies any new issues or concerns.  Education and supportive counseling provided throughout her hospital stay & upon discharge.  Today upon her discharge evaluation with the attending psychiatrist, Itali shares she is doing well. She denies any other specific concerns. She is sleeping well. Her appetite is good. She denies other physical complaints. She denies AH/VH. She feels that her medications have been helpful & is in agreement to continue her current treatment regimen. She was able to engage in safety planning including plan to return to [facility] or contact emergency services if she feels unable to maintain her own safety or the safety of others. Pt had no further questions, comments, or concerns. She left [facility] with all personal belongings in no apparent distress. Transportation per her mother.  Physical Findings: AIMS:  , ,  ,  ,    CIWA:    COWS:     Musculoskeletal: Strength & Muscle Tone: within normal limits Gait & Station: normal Patient leans: N/A  Psychiatric Specialty Exam: Physical Exam Vitals and nursing note reviewed.  Constitutional:      Appearance: She is well-developed.  HENT:     Head: Normocephalic.     Nose: Nose normal.     Mouth/Throat:     Pharynx: Oropharynx is clear.  Eyes:     Pupils: Pupils are equal, round, and reactive to light.  Cardiovascular:     Rate and Rhythm: Normal rate.     Pulses: Normal pulses.  Pulmonary:     Effort: Pulmonary effort is normal.  Abdominal:     Palpations: Abdomen is soft.  Genitourinary:    Comments: Deferred Musculoskeletal:        General: Normal range of motion.     Cervical back: Normal range of motion.  Skin:    General: Skin is warm.  Neurological:     Mental Status: She is alert and oriented to person, place, and time.     Review of Systems  Constitutional: Negative.  Negative for chills, diaphoresis and fever.  HENT: Negative.   Eyes: Negative.   Respiratory: Negative.  Negative for cough, shortness of breath and wheezing.    Cardiovascular: Negative.  Negative for chest pain and palpitations.  Gastrointestinal: Negative.  Negative for abdominal pain, diarrhea, heartburn, nausea and vomiting.  Genitourinary: Negative.   Musculoskeletal: Negative.  Negative for joint pain and myalgias.  Skin: Negative.  Negative for itching and rash.  Neurological: Negative.  Negative for dizziness, tingling, tremors, sensory change, speech change, focal weakness, seizures, loss of consciousness, weakness and headaches.  Endo/Heme/Allergies: Negative.        Allergies:  Cephalexin. Metronidazole. Orange. Orange Oil  . Shrimp (shellfish Allergy)  PCN reaction. Cephalosporin. Sulfonamide.  Glipizide. Ace Inhibitors. Sulfamethoxazole-trimethoprim. Trimethoprim       Psychiatric/Behavioral: Positive for depression (Stabilized with medication prior to discharge) and hallucinations (Hx. psychosis (Stabilized with medication prior to discharge)  ). Negative for memory loss, substance abuse and suicidal ideas. The patient has insomnia (Stabilized with medication prior to discharge). The patient is not nervous/anxious.     Blood pressure 119/80, pulse 79, temperature 97.7 F (36.5 C), temperature source Oral, resp. rate 18, height _0  (1.651 m), weight 68.5 kg, SpO2 100 %.Body mass index is 25.13 kg/m.  See Md's discharge SRA   Has this patient used any form of tobacco in the last 30 days? (Cigarettes, Smokeless Tobacco, Cigars, and/or Pipes): Yes, an FDA-approved tobacco cessation medication was recommended at discharge.  Blood Alcohol level:  Lab Results  Component Value Date   ETH <10 10/10/2019   ETH <10 69/62/9528   Metabolic Disorder Labs:  Lab Results  Component Value Date   HGBA1C 7.2 (A) 08/19/2019   MPG 165.68 06/04/2019   MPG 225.95 09/15/2018   Lab Results  Component Value Date   PROLACTIN 11.6 09/15/2018   Lab Results  Component Value Date   CHOL 86 06/05/2019   TRIG 118 06/05/2019   HDL 28 (L)  06/05/2019   CHOLHDL 3.1 06/05/2019   VLDL 24 06/05/2019   LDLCALC 34 06/05/2019   LDLCALC 88 09/15/2018   See Psychiatric Specialty Exam and Suicide Risk Assessment completed by Attending Physician prior to discharge.  Discharge destination:  Home  Is patient on multiple antipsychotic therapies at discharge:  No   Has Patient had three or more failed trials of antipsychotic monotherapy by history:  No  Recommended Plan for Multiple Antipsychotic Therapies: NA  Allergies as of 10/16/2019      Reactions   Cephalexin    Trouble breathing, felt like she had bumps in her throat.   Metronidazole Shortness Of Breath, Swelling   Orange Itching   Orange Oil Hives, Other (See Comments)   Other Shortness Of Breath, Itching   Takes Benadryl before eating shrimp.   Shrimp [shellfish Allergy] Shortness Of Breath, Itching   Takes Benadryl before eating shrimp.   Penicillins Hives, Swelling, Other (See Comments)   Has patient had a PCN reaction causing immediate rash, facial/tongue/throat swelling, SOB or lightheadedness with hypotension: Yes Has patient had a PCN reaction causing severe rash involving mucus membranes or skin necrosis: No Has patient had a PCN reaction that required hospitalization No Has patient had a PCN reaction occurring within the last 10 years: Yes If all of the above answers are "NO", then may proceed with Cephalosporin use. Fever as well   Sulfonamide Derivatives Hives   fever   Glipizide Other (See Comments)   psychosis   Ace Inhibitors Cough   06/2016   Sulfamethoxazole Rash   Sulfamethoxazole-trimethoprim Rash   Trimethoprim Other (See Comments), Rash      Medication List    STOP taking these medications   acetaminophen 325 MG tablet Commonly known as: TYLENOL   benztropine 1 MG tablet Commonly known as: COGENTIN   Magnesium 500 MG Tabs   perphenazine 4 MG tablet Commonly known as: TRILAFON     TAKE these medications     Indication  Accu-Chek  Softclix Lancets lancets Use as instructed  Indication: Blood sugar monitoring   albuterol 108 (90 Base) MCG/ACT inhaler Commonly known as: Ventolin HFA Inhale 1-2 puffs into the lungs every 6 (six) hours as needed for wheezing or shortness of breath.  Indication: Asthma   amLODipine 10 MG tablet Commonly known as: NORVASC Take 1 tablet (10 mg total) by mouth daily. For high blood pressure What changed:   when to take this  additional instructions  Indication: High Blood Pressure Disorder   B-1 100 MG Tabs Take 1 tablet (100 mg total) by mouth daily.  Indication: Deficiency of Vitamin B1   ferrous sulfate 325 (65 FE) MG tablet Take 1 tablet (325 mg total) by mouth every other day. (May buy from over the counter): For anemia  Indication: Iron Deficiency   insulin glargine 100 UNIT/ML injection Commonly known as: LANTUS Inject 0.14 mLs (14 Units total) into the skin daily. For diabetes management What changed:   how much to take  additional instructions  Indication: Type 2 Diabetes   losartan 50 MG tablet Commonly known as: COZAAR Take 1 tablet (50 mg total) by mouth daily.  Indication: High Blood Pressure Disorder   lovastatin 40 MG tablet Commonly known as: Mevacor Take 1 tablet (40 mg total) by mouth every evening.  Indication: Type II A Hyperlipidemia   metFORMIN 1000 MG tablet Commonly known as: GLUCOPHAGE Take 1 tablet (1,000 mg total) by mouth 2 (two) times daily with a meal.  Indication: Type 2 Diabetes   metoprolol tartrate 100 MG tablet Commonly known as: LOPRESSOR Take 1 tablet (100 mg total) by mouth 2 (two) times daily.  Indication: High Blood Pressure Disorder   mirtazapine 15 MG tablet Commonly known as: Remeron Take 1 tablet (15 mg total) by mouth at bedtime. For depression/sleep What changed: additional instructions  Indication: Major Depressive Disorder, Insomnia   nicotine 7 mg/24hr patch Commonly known as: NICODERM CQ - dosed in mg/24  hr Place 1 patch (7 mg total) onto the skin daily.  Indication: Nicotine Addiction   paliperidone 6 MG 24 hr tablet Commonly known as: INVEGA Take 1 tablet (6 mg total) by mouth at bedtime. For mood control  Indication: Mood control   senna-docusate 8.6-50 MG tablet Commonly known as: Senokot-S Take 1 tablet by mouth at bedtime as needed. (May buy from over the counter): For constipation What changed:   reasons to take this  additional instructions  Indication: Constipation   sertraline 100 MG tablet Commonly known as: ZOLOFT Take 1 tablet (100 mg total) by mouth daily. For depression What changed: additional instructions  Indication: Major Depressive Disorder   spironolactone 50 MG tablet Commonly known as: ALDACTONE Take 1 tablet (50 mg total) by mouth daily.  Indication: High Blood Pressure Disorder       Follow-up Information    [facility] Follow up on 11/02/2019.   Specialty: Behavioral Health Why: You have a virtual appointment for therapy on 11/02/19 at 3:30 pm. You also have an appointment with Dr. Adele Schilder for medication management on 11/11/19 at 11:20 am.  These are VIRTUAL appointments.  Contact information: [place] [phone]             Follow-up recommendations: Activity:  As tolerated Diet: As recommended by your primary care doctor. Keep all scheduled follow-up appointments as recommended.   Comments: Patient is instructed prior to discharge to: Take all medications as prescribed by his/her mental healthcare provider. Report any adverse effects and or reactions from the medicines to his/her outpatient provider promptly. Patient has been instructed & cautioned: To not engage in alcohol and or illegal drug use while on prescription medicines. In the event of worsening symptoms,  patient is instructed to call the crisis hotline, 911 and or go to the nearest ED for  appropriate evaluation and treatment of symptoms. To follow-up with his/her primary care provider for your other medical issues, concerns and or health care needs.   Signed: Lindell Spar, NP, PMHNP, FNP-BC 10/16/2019, 10:04 AM

## 2019-10-21 ENCOUNTER — Ambulatory Visit: Payer: Medicare Other | Admitting: *Deleted

## 2019-10-21 DIAGNOSIS — I5032 Chronic diastolic (congestive) heart failure: Secondary | ICD-10-CM

## 2019-10-21 DIAGNOSIS — Z794 Long term (current) use of insulin: Secondary | ICD-10-CM

## 2019-10-21 DIAGNOSIS — J449 Chronic obstructive pulmonary disease, unspecified: Secondary | ICD-10-CM

## 2019-10-21 DIAGNOSIS — F319 Bipolar disorder, unspecified: Secondary | ICD-10-CM

## 2019-10-21 DIAGNOSIS — I1A0 Resistant hypertension: Secondary | ICD-10-CM

## 2019-10-21 DIAGNOSIS — I10 Essential (primary) hypertension: Secondary | ICD-10-CM

## 2019-10-21 DIAGNOSIS — E119 Type 2 diabetes mellitus without complications: Secondary | ICD-10-CM

## 2019-10-21 NOTE — Chronic Care Management (AMB) (Signed)
Chronic Care Management   Follow Up Note   10/21/2019 Name: [name] MRN: [id number] DOB: [birth year]  Referred by: Riesa Pope, MD Reason for referral : Chronic Care Management (DM, HTN, HF, COPD)   [name] is a 54 y.o. year old female who is a primary care patient of [name], [name], MD. The CCM team was consulted for assistance with chronic disease management and care coordination needs.    Review of patient status, including review of consultants reports, relevant laboratory and other test results, and collaboration with appropriate care team members and the patient's provider was performed as part of comprehensive patient evaluation and provision of chronic care management services.    SDOH (Social Determinants of Health) assessments performed: No See Care Plan activities for detailed interventions related to [facility])     Outpatient Encounter Medications as of 10/21/2019  Medication Sig Note  . Accu-Chek Softclix Lancets lancets Use as instructed   . albuterol (VENTOLIN HFA) 108 (90 Base) MCG/ACT inhaler Inhale 1-2 puffs into the lungs every 6 (six) hours as needed for wheezing or shortness of breath. 09/11/2019: Uses rarely   . amLODipine (NORVASC) 10 MG tablet Take 1 tablet (10 mg total) by mouth daily. For high blood pressure (Patient taking differently: Take 10 mg by mouth at bedtime. )   . ferrous sulfate 325 (65 FE) MG tablet Take 1 tablet (325 mg total) by mouth every other day. (May buy from over the counter): For anemia   . insulin glargine (LANTUS) 100 UNIT/ML injection Inject 0.14 mLs (14 Units total) into the skin daily. For diabetes management   . losartan (COZAAR) 50 MG tablet Take 1 tablet (50 mg total) by mouth daily.   Marland Kitchen lovastatin (MEVACOR) 40 MG tablet Take 1 tablet (40 mg total) by mouth every evening.   . metFORMIN (GLUCOPHAGE) 1000 MG tablet Take 1 tablet (1,000 mg total) by mouth 2 (two) times daily with a meal.   . metoprolol  tartrate (LOPRESSOR) 100 MG tablet Take 1 tablet (100 mg total) by mouth 2 (two) times daily.   . mirtazapine (REMERON) 15 MG tablet Take 1 tablet (15 mg total) by mouth at bedtime. For depression/sleep   . nicotine (NICODERM CQ - DOSED IN MG/24 HR) 7 mg/24hr patch Place 1 patch (7 mg total) onto the skin daily. (Patient not taking: Reported on 10/10/2019)   . paliperidone (INVEGA) 6 MG 24 hr tablet Take 1 tablet (6 mg total) by mouth at bedtime. For mood control   . senna-docusate (SENOKOT-S) 8.6-50 MG tablet Take 1 tablet by mouth at bedtime as needed. (May buy from over the counter): For constipation   . sertraline (ZOLOFT) 100 MG tablet Take 1 tablet (100 mg total) by mouth daily. For depression   . spironolactone (ALDACTONE) 50 MG tablet Take 1 tablet (50 mg total) by mouth daily.   . Thiamine HCl (VITAMIN B-1) 100 MG TABS Take 1 tablet (100 mg total) by mouth daily.    No facility-administered encounter medications on file as of 10/21/2019.     Objective:  Lab Results  Component Value Date   HGBA1C 7.2 (A) 08/19/2019   HGBA1C 7.4 (H) 06/04/2019   HGBA1C 7.3 (A) 02/19/2019   Lab Results  Component Value Date   MICROALBUR 0.50 09/30/2013   LDLCALC 34 06/05/2019   CREATININE 1.19 (H) 10/15/2019   Wt Readings from Last 3 Encounters:  10/09/19 156 lb 8.4 oz (71 kg)  08/19/19 157 lb 12.8 oz (71.6 kg)  06/18/19 171 lb 8.3 oz (77.8 kg)   Lab Results  Component Value Date   CHOL 86 06/05/2019   HDL 28 (L) 06/05/2019   LDLCALC 34 06/05/2019   TRIG 118 06/05/2019   CHOLHDL 3.1 06/05/2019   BP Readings from Last 3 Encounters:  10/12/19 109/69  08/19/19 120/79  07/15/19 116/86    Goals Addressed              This Visit's Progress     Patient Stated   .  'I'm feeling better. I check my blood pressure 2-3 times a day but I haven't  checked my blood sugar since I left the hospital" (pt-stated)        [place] (see longitudinal plan of care for additional care plan  information)  Current Barriers:  . Chronic Disease Management support, education, and care coordination needs related to HTN, HLD, COPD, DMII, Bipolar Disorder, and Schizoaffective Disorder - patient reports she has not checked her blood sugar since she was discharged from the hospital [facility]) last week on 7/2, she says she or her parents check her blood pressure 2-3 times daily, her Mom reports she sometimes holds patient's medications if her blood pressure is low  Clinical Goal(s) related to HTN, HLD, COPD, DMII, Bipolar Disorder, and Schizoaffective Disorder Over the next 30 -60 days, patient will:  . Work with the care management team to address educational, disease management, and care coordination needs  . Begin or continue self health monitoring activities as directed today Measure and record cbg (blood glucose) 1 times daily, Measure and record blood pressure 1-2 times per week . Call provider office for new or worsened signs and symptoms Blood glucose findings outside established parameters, Blood pressure findings outside established parameters, Shortness of breath, and New or worsened symptom related to HTN, HLD, COPD, DMII, Bipolar Disorder, and Schizoaffective Disorder . Call care management team with questions or concerns . Verbalize basic understanding of patient centered plan of care established today  Interventions related to HTN, HLD, COPD, DMII, Bipolar Disorder, and Schizoaffective Disorder  . Evaluation of current treatment plans and patient's adherence to plan as established by provider . Assessed patient understanding of disease states . Assessed patient's education and care coordination needs . Provided disease specific education to patient - reviewed blood pressure targets with patient's Mom, at her request mailed another [facility] spiral bound calendar with action plans for DM, HTN and COPD to parent's home address . Encourage  patient and Mom to check blood pressure and blood sugar anytime patient does not feel good.  . Recommended patient resume checking a fasting blood sugar in the morning since she is on Lantus. . Provided Mom with Dr. Marguerite Olea (psychiatrist)  nurse's direct number [phone] . Collaborated with appropriate clinical care team members regarding patient needs  Patient Self Care Activities related to HTN, HLD, COPD, DMII, Bipolar Disorder, and Schizoaffective Disorder:  . Patient is unable to independently self-manage chronic health conditions  Please see past updates related to this goal by clicking on the "Past Updates" button in the selected goal          Plan:   The care management team will reach out to the patient again over the next 30-60 days.   Kelli Churn RN, CCM, [facility] RN Care Manager [phone]

## 2019-10-21 NOTE — Patient Instructions (Signed)
Visit Information It was nice speaking with you today. I will mail another spiral bound calendar to your parent's address. Please record your blood pressure and blood sugar on the appropriate log sheets in the back of the calendar.  Goals Addressed              This Visit's Progress     Patient Stated   .  'I'm feeling better. I check my blood pressure 2-3 times a day but I haven't  checked my blood sugar since I left the hospital" (pt-stated)        [place] (see longitudinal plan of care for additional care plan information)  Current Barriers:  . Chronic Disease Management support, education, and care coordination needs related to HTN, HLD, COPD, DMII, Bipolar Disorder, and Schizoaffective Disorder - patient reports she has not checked her blood sugar since she was discharged from the hospital [facility]) last week on 7/2, she says she or her parents check her blood pressure 2-3 times daily, her Mom reports she sometimes holds patient's medications if her blood pressure is low  Clinical Goal(s) related to HTN, HLD, COPD, DMII, Bipolar Disorder, and Schizoaffective Disorder Over the next 30 -60 days, patient will:  . Work with the care management team to address educational, disease management, and care coordination needs  . Begin or continue self health monitoring activities as directed today Measure and record cbg (blood glucose) 1 times daily, Measure and record blood pressure 1-2 times per week . Call provider office for new or worsened signs and symptoms Blood glucose findings outside established parameters, Blood pressure findings outside established parameters, Shortness of breath, and New or worsened symptom related to HTN, HLD, COPD, DMII, Bipolar Disorder, and Schizoaffective Disorder . Call care management team with questions or concerns . Verbalize basic understanding of patient centered plan of care established today  Interventions related to HTN, HLD, COPD,  DMII, Bipolar Disorder, and Schizoaffective Disorder  . Evaluation of current treatment plans and patient's adherence to plan as established by provider . Assessed patient understanding of disease states . Assessed patient's education and care coordination needs . Provided disease specific education to patient - reviewed blood pressure targets with patient's Mom, at her request mailed another [facility] spiral bound calendar with action plans for DM, HTN and COPD to parent's home address . Encourage patient and Mom to check blood pressure and blood sugar anytime patient does not feel good.  . Recommended patient resume checking a fasting blood sugar in the morning since she is on Lantus. . Provided Mom with Dr. Marguerite Olea (psychiatrist) nurse's direct number [phone] . Collaborated with appropriate clinical care team members regarding patient needs  Patient Self Care Activities related to HTN, HLD, COPD, DMII, Bipolar Disorder, and Schizoaffective Disorder:  . Patient is unable to independently self-manage chronic health conditions  Please see past updates related to this goal by clicking on the "Past Updates" button in the selected goal         The patient verbalized understanding of instructions provided today and declined a print copy of patient instruction materials.   The care management team will reach out to the patient again over the next 30-60 days.   Kelli Churn RN, CCM, [facility] RN Care Manager [phone]

## 2019-10-23 LAB — GLUCOSE, CAPILLARY: Glucose-Capillary: 148 mg/dL — ABNORMAL HIGH (ref 70–99)

## 2019-11-02 ENCOUNTER — Ambulatory Visit (INDEPENDENT_AMBULATORY_CARE_PROVIDER_SITE_OTHER): Payer: Medicare Other | Admitting: Psychiatry

## 2019-11-02 ENCOUNTER — Other Ambulatory Visit: Payer: Self-pay

## 2019-11-02 DIAGNOSIS — F319 Bipolar disorder, unspecified: Secondary | ICD-10-CM

## 2019-11-02 DIAGNOSIS — F250 Schizoaffective disorder, bipolar type: Secondary | ICD-10-CM | POA: Diagnosis not present

## 2019-11-02 NOTE — Progress Notes (Signed)
Virtual Visit via Video Note  I connected with [name] on 11/02/19 at  3:30 PM EDT by a video enabled telemedicine application and verified that I am speaking with the correct person using two identifiers.  Location: Patient: Patient Home Provider: Home Office   I discussed the limitations of evaluation and management by telemedicine and the availability of in person appointments. The patient expressed understanding and agreed to proceed.  History of Present Illness: Schizoeffective DO and Anxiety   Treatment Plan Goals: 1)The patient will work with the [facility] therapist to reduce/eliminate symptoms of her Bipolar Disorder as measured by having fewer than 2 episodes per week, as evidenced by the patient report. 2) Client will work on improving self-esteem, through completing self-assessments/inventories, positive self talk and gratitude journal, as evidenced by patient report. 3) Client will work on "taking better care of myself," through taking medications as prescribed, eating 3 meals a day, attending doctors appointments, engaging in daily hygiene routines, as evidenced by patient report.  Observations/Objective: Counselor met with Client for individual therapy via Webex. Counselor assessed MH symptoms and progress on treatment plan goals, with patient reporting that she recently was discharged from inpatient behavioral health with psychiatrist and family recommending engagement in outpatient therapy. Client indifferent in engaging, stating that "I am doing better now, I'm better, I'm fine". Client presents with mild depression and mild anxiety. Client denied suicidal ideation or self-harm behaviors.   Counselor assessed daily functioing, support system and current needs. Client shared that she is addressing needs and maintaining stability through "reading the word", staying at my mom's house for support, eating, sleeping, and keeping up with daily hygiene practices. Client discussed a  need to get more connected with self, interests and preferences, so we added goals number 2 and 3 to address current needs. Client would like to get reconnected with her faith, faith practices and faith community to reconnect with self and improve self-esteem. Client is in the process of establishing benefits to become more self-sufficient and independent again. Client states that she is taking medications as prescribed 100% of the time. Counselor explored motivations to continue in counseling, with client stating that she would schedule one follow up appointment. Counselor to send a variety of links and contacts as resources in assisting facilitation of goal achievement.   Assessment and Plan: Counselor will continue to meet with patient to address treatment plan goals. Patient will continue to follow recommendations of providers and implement skills learned in session.  Follow Up Instructions: Counselor will send information for next session via Webex.    The patient was advised to call back or seek an in-person evaluation if the symptoms worsen or if the condition fails to improve as anticipated.  I provided 45 minutes of non-face-to-face time during this encounter.   Lise Auer, LCSW

## 2019-11-05 ENCOUNTER — Other Ambulatory Visit: Payer: Self-pay

## 2019-11-05 ENCOUNTER — Ambulatory Visit (INDEPENDENT_AMBULATORY_CARE_PROVIDER_SITE_OTHER): Payer: Medicare Other | Admitting: Podiatry

## 2019-11-05 DIAGNOSIS — E1121 Type 2 diabetes mellitus with diabetic nephropathy: Secondary | ICD-10-CM | POA: Diagnosis not present

## 2019-11-05 DIAGNOSIS — M216X9 Other acquired deformities of unspecified foot: Secondary | ICD-10-CM | POA: Diagnosis not present

## 2019-11-05 DIAGNOSIS — Q828 Other specified congenital malformations of skin: Secondary | ICD-10-CM | POA: Diagnosis not present

## 2019-11-07 ENCOUNTER — Other Ambulatory Visit (HOSPITAL_COMMUNITY): Payer: Self-pay | Admitting: Psychiatry

## 2019-11-07 DIAGNOSIS — F419 Anxiety disorder, unspecified: Secondary | ICD-10-CM

## 2019-11-09 ENCOUNTER — Encounter (HOSPITAL_COMMUNITY): Payer: Self-pay | Admitting: Psychiatry

## 2019-11-09 ENCOUNTER — Telehealth (HOSPITAL_COMMUNITY): Payer: Self-pay | Admitting: *Deleted

## 2019-11-09 NOTE — Telephone Encounter (Signed)
Pt called concerned about new onset of night sweats. Pt denies any other symptoms, denies menopausal issues. Pt has an appointment on 11/11/19 and writer told her to discuss with you then.

## 2019-11-09 NOTE — Progress Notes (Signed)
Subjective:   Patient ID: [name], female   DOB: 54 y.o.   MRN: [id number]   HPI 54 year old female presents the office today for concerns of a painful callus to the bottom of her left foot pointing to submetatarsal 5.  The area is tender with walking.  She states that the skin came off which made it feel better.  She is diabetic and states that her last blood sugar A1c was 7.  Denies any open sores or any swelling.   Review of Systems  All other systems reviewed and are negative.  Past Medical History:  Diagnosis Date  . Alcohol abuse   . Anemia, iron deficiency   . Arteriosclerotic cardiovascular disease (ASCVD)    Minimal at cath in [facility].stress nuclear study in 8/08 with nl EF; neg stress echo in 2010  . Community acquired pneumonia 01/03/10, 05/2010, 04/2012   2011; with pleural effusion-hosp [facility] acute resp failure; intubated in Jan 2014 (HMPV pneumonia)  . Depression   . Diabetes mellitus, type 2 ([place]) 2000   Onset in 2000; no insulin  . Diarrhea 10/14/2013   Started 10/10/13, improved with Imodium.   . Diastolic dysfunction    grade 2 per echo 2011  . Dysphagia   . Gastroesophageal reflux disease    Schatzki's ring  . History of alcohol abuse 07/22/2007   Qualifier: Diagnosis of  By: Lenn Cal    . Hyperlipidemia   . Hypertension `   during treatment with Geodon  . Hypokalemia 12/25/2013  . Left knee pain 08/25/2014  . Obesity   . Oral candidiasis 05/16/2017  . PTSD (post-traumatic stress disorder)   . Pulmonary hypertension ([place]) 05/02/2012   Patient needs repeat echo in 06/2012   . Schizoaffective disorder    requiring multiple psychiatric admissions  . Viral URI 05/12/2013  . Viral wart on finger 10/08/2016  . Vision changes 08/12/2014    Past Surgical History:  Procedure Laterality Date  . COLONOSCOPY  01/2006   internal hemorrhoids  . COLONOSCOPY  01/10/2012   Dr. Rourk:Single anal canal hemorrhoidal tag likely source of  trivial  hematochezia; right-sided colonic diverticulosis  . DILATION AND CURETTAGE, DIAGNOSTIC / THERAPEUTIC  1992  . ESOPHAGEAL DILATION N/A 08/18/2015   Procedure: ESOPHAGEAL DILATION;  Surgeon: Daneil Dolin, MD;  Location: [facility];  Service: Endoscopy;  Laterality: N/A;  . ESOPHAGOGASTRODUODENOSCOPY  09/16/08   Dr. Trevor Iha hiatal hernia/excoriations involving the cardia and mucosa consistent with trauma, antral erosions  of linear petechiae ? gastritis versus early gastric antral vascular  ectasia.Marland Kitchen biopsy showed reactive gastropathy. No H. pylori.  . ESOPHAGOGASTRODUODENOSCOPY  09/2007   Dr. Evalee Mutton ring, dilated to 67 French Maloney dilator, small hiatal hernia, antral erosions, biopsies reactive gastropathy.  . ESOPHAGOGASTRODUODENOSCOPY (EGD) WITH PROPOFOL N/A 12/17/2013   UXL:[id number] antral erosions and petechiae. Small hiatal hernia. No endoscopic explanation for patient's symptoms  . ESOPHAGOGASTRODUODENOSCOPY (EGD) WITH PROPOFOL N/A 08/18/2015   Dr. Gala Romney: normal exam, s/p esophageal dilation  . ESOPHAGOGASTRODUODENOSCOPY (EGD) WITH PROPOFOL N/A 06/18/2019   Procedure: ESOPHAGOGASTRODUODENOSCOPY (EGD) WITH PROPOFOL;  Surgeon: Daneil Dolin, MD;  Location: [facility];  Service: Endoscopy;  Laterality: N/A;  2:30pm - office moved to 10:15  . SAVORY DILATION  07/17/2011   [facility] sedation-->distal esophageal stricture s/p dilation, chronic gastritis, multiple ulcers in stomach. no h.pylori     Current Outpatient Medications:  .  Accu-Chek Softclix Lancets lancets, Use as instructed, Disp: 400 each, Rfl: 12 .  albuterol (VENTOLIN  HFA) 108 (90 Base) MCG/ACT inhaler, Inhale 1-2 puffs into the lungs every 6 (six) hours as needed for wheezing or shortness of breath., Disp: 18 g, Rfl: 5 .  amLODipine (NORVASC) 10 MG tablet, Take 1 tablet (10 mg total) by mouth daily. For high blood pressure (Patient taking differently: Take 10 mg by mouth at bedtime. ), Disp: 90 tablet, Rfl: 3 .   ferrous sulfate 325 (65 FE) MG tablet, Take 1 tablet (325 mg total) by mouth every other day. (May buy from over the counter): For anemia, Disp: 15 tablet, Rfl: 2 .  insulin glargine (LANTUS) 100 UNIT/ML injection, Inject 0.14 mLs (14 Units total) into the skin daily. For diabetes management, Disp: 10 mL, Rfl: 0 .  losartan (COZAAR) 50 MG tablet, Take 1 tablet (50 mg total) by mouth daily., Disp: 90 tablet, Rfl: 1 .  lovastatin (MEVACOR) 40 MG tablet, Take 1 tablet (40 mg total) by mouth every evening., Disp: 90 tablet, Rfl: 0 .  metFORMIN (GLUCOPHAGE) 1000 MG tablet, Take 1 tablet (1,000 mg total) by mouth 2 (two) times daily with a meal., Disp: 180 tablet, Rfl: 0 .  metoprolol tartrate (LOPRESSOR) 100 MG tablet, Take 1 tablet (100 mg total) by mouth 2 (two) times daily., Disp: 180 tablet, Rfl: 0 .  mirtazapine (REMERON) 15 MG tablet, Take 1 tablet (15 mg total) by mouth at bedtime. For depression/sleep, Disp: 30 tablet, Rfl: 0 .  nicotine (NICODERM CQ - DOSED IN MG/24 HR) 7 mg/24hr patch, Place 1 patch (7 mg total) onto the skin daily. (Patient not taking: Reported on 10/10/2019), Disp: 21 patch, Rfl: 0 .  paliperidone (INVEGA) 6 MG 24 hr tablet, Take 1 tablet (6 mg total) by mouth at bedtime. For mood control, Disp: 30 tablet, Rfl: 0 .  senna-docusate (SENOKOT-S) 8.6-50 MG tablet, Take 1 tablet by mouth at bedtime as needed. (May buy from over the counter): For constipation, Disp: 60 tablet, Rfl: 1 .  sertraline (ZOLOFT) 100 MG tablet, Take 1 tablet (100 mg total) by mouth daily. For depression, Disp: 30 tablet, Rfl: 0 .  spironolactone (ALDACTONE) 50 MG tablet, Take 1 tablet (50 mg total) by mouth daily., Disp: 90 tablet, Rfl: 3 .  Thiamine HCl (VITAMIN B-1) 100 MG TABS, Take 1 tablet (100 mg total) by mouth daily., Disp: 90 tablet, Rfl: 1  Allergies  Allergen Reactions  . Cephalexin     Trouble breathing, felt like she had bumps in her throat.  . Metronidazole Shortness Of Breath and Swelling   . Orange Itching  . Orange [employer] and Other (See Comments)  . Other Shortness Of Breath and Itching    Takes Benadryl before eating shrimp.  . Shrimp [Shellfish Allergy] Shortness Of Breath and Itching    Takes Benadryl before eating shrimp.  Marland Kitchen Penicillins Hives, Swelling and Other (See Comments)    Has patient had a PCN reaction causing immediate rash, facial/tongue/throat swelling, SOB or lightheadedness with hypotension: Yes Has patient had a PCN reaction causing severe rash involving mucus membranes or skin necrosis: No Has patient had a PCN reaction that required hospitalization No Has patient had a PCN reaction occurring within the last 10 years: Yes If all of the above answers are "NO", then may proceed with Cephalosporin use.    Fever as well  . Sulfonamide Derivatives Hives    fever  . Glipizide Other (See Comments)    psychosis  . Ace Inhibitors Cough    06/2016  . Sulfamethoxazole Rash  .  Sulfamethoxazole-Trimethoprim Rash  . Trimethoprim Other (See Comments) and Rash        Objective:  Physical Exam  General: AAO x3, NAD  Dermatological: Hyperkeratotic tissue left foot submetatarsal 5.  There is no underlying ulceration or signs of infection noted today.  There is no open lesions.  There is no areas of fluctuation.  Vascular: Dorsalis Pedis artery and Posterior Tibial artery pedal pulses are 2/4 bilateral with immedate capillary fill time.There is no pain with calf compression, swelling, warmth, erythema.   Neruologic: Grossly intact via light touch bilateral.  Sensation intact with Semmes Weinstein monofilament  Musculoskeletal: Prominent metatarsal head plantarly.  Muscular strength 5/5 in all groups tested bilateral.  Gait: Unassisted, Nonantalgic.     Assessment:   Hyperkeratotic lesion left foot    Plan:  -Treatment options discussed including all alternatives, risks, and complications -Etiology of symptoms were discussed -Sharply debrided the  hyperkeratotic lesion with any complications or bleeding.  Recommend moisturizer daily as well as offloading.  Dispensed metatarsal offloading pads. Discussed shoe modifications/orthotics  Return in about 3 months (around 02/05/2020).  Trula Slade DPM

## 2019-11-10 ENCOUNTER — Ambulatory Visit: Payer: Medicare Other | Admitting: *Deleted

## 2019-11-10 DIAGNOSIS — I5032 Chronic diastolic (congestive) heart failure: Secondary | ICD-10-CM

## 2019-11-10 DIAGNOSIS — Z794 Long term (current) use of insulin: Secondary | ICD-10-CM

## 2019-11-10 DIAGNOSIS — F319 Bipolar disorder, unspecified: Secondary | ICD-10-CM

## 2019-11-10 DIAGNOSIS — I10 Essential (primary) hypertension: Secondary | ICD-10-CM

## 2019-11-10 DIAGNOSIS — J449 Chronic obstructive pulmonary disease, unspecified: Secondary | ICD-10-CM

## 2019-11-10 NOTE — Patient Instructions (Signed)
Visit Information Please call your psychiatrist and tell him about your night sweats.  Goals Addressed              This Visit's Progress     Patient Stated   .  "I'm having night sweats. Could it be a side effect of one of my medicines? (pt-stated)        CARE PLAN ENTRY (see longitudinal plan of care for additional care plan information)  Current Barriers:  Marland Kitchen Knowledge Deficits related to etiology of recent onset of night sweats - returned call to patient after she left message earlier today. She is reporting night sweats that result in her bed linens and bed clothes wet with perspiration, she says she started a new psychiatric medicine Invega about 3 weeks ago . She denies fever, chills or myalgia  Nurse Case Manager Clinical Goal(s):  Marland Kitchen Over the next 7 days, patient will verbalize understanding of plan to contact her psychiatrist about night sweats  Interventions:  . Inter-disciplinary care team collaboration (see longitudinal plan of care) . Advised patient to call her psychaitrst and report her sympoms  Patient Self Care Activities:  . Patient verbalizes understanding of plan to contact her psychiatrist . Does not contact provider office for questions/concerns  Initial goal documentation     .  'I'm feeling better. I check my blood pressure 2-3 times a day but I haven't  checked my blood sugar since I left the hospital" (pt-stated)        [place] (see longitudinal plan of care for additional care plan information)  Current Barriers:  . Chronic Disease Management support, education, and care coordination needs related to HTN, HLD, COPD, DMII, Bipolar Disorder, and Schizoaffective Disorder - patient reports her blood pressure and blood sugar readings are good, (her Mom checks her blood pressure)  Clinical Goal(s) related to HTN, HLD, COPD, DMII, Bipolar Disorder, and Schizoaffective Disorder Over the next 30 -60 days, patient will:  . Work with the care management  team to address educational, disease management, and care coordination needs  . Begin or continue self health monitoring activities as directed today Measure and record cbg (blood glucose) 1 times daily, Measure and record blood pressure 1-2 times per week . Call provider office for new or worsened signs and symptoms Blood glucose findings outside established parameters, Blood pressure findings outside established parameters, Shortness of breath, and New or worsened symptom related to HTN, HLD, COPD, DMII, Bipolar Disorder, and Schizoaffective Disorder . Call care management team with questions or concerns . Verbalize basic understanding of patient centered plan of care established today  Interventions related to HTN, HLD, COPD, DMII, Bipolar Disorder, and Schizoaffective Disorder  . Evaluation of current treatment plans and patient's adherence to plan as established by provider . Assessed patient understanding of disease states . Assessed patient's education and care coordination needs . Collaborated with appropriate clinical care team members regarding patient needs  Patient Self Care Activities related to HTN, HLD, COPD, DMII, Bipolar Disorder, and Schizoaffective Disorder:  . Patient is unable to independently self-manage chronic health conditions  Please see past updates related to this goal by clicking on the "Past Updates" button in the selected goal        Other   .  HEMOGLOBIN A1C < 7        Lab Results  Component Value Date   HGBA1C 7.2 (A) 08/19/2019    Not meeting target A1C    .  LDL CALC <  100        Lab Results  Component Value Date   CHOL 86 06/05/2019   HDL 28 (L) 06/05/2019   LDLCALC 34 06/05/2019   TRIG 118 06/05/2019   CHOLHDL 3.1 06/05/2019  Meeting LDL target    .  Weight < 195 lb (88.451 kg)        Wt Readings from Last 3 Encounters:  08/19/19 157 lb 12.8 oz (71.6 kg)  06/18/19 171 lb 8.3 oz (77.8 kg)  06/17/19 171 lb 9.6 oz (77.8 kg)   Meeting weight  target       The patient verbalized understanding of instructions provided today and declined a print copy of patient instruction materials.   The care management team will reach out to the patient again over the next 30-60 days.   Kelli Churn RN, CCM, [facility] RN Care Manager [phone]

## 2019-11-10 NOTE — Chronic Care Management (AMB) (Signed)
Chronic Care Management   Follow Up Note   11/10/2019 Name: [name] MRN: [id number] DOB: 22-Jan-1966  Referred by: Riesa Pope, MD Reason for referral : Chronic Care Management (HF, DM, HTN, COPD)   [name] is a 54 y.o. year old female who is a primary care patient of [name], [name], MD. The CCM team was consulted for assistance with chronic disease management and care coordination needs.    Review of patient status, including review of consultants reports, relevant laboratory and other test results, and collaboration with appropriate care team members and the patient's provider was performed as part of comprehensive patient evaluation and provision of chronic care management services.    SDOH (Social Determinants of Health) assessments performed: No See Care Plan activities for detailed interventions related to [facility])     Outpatient Encounter Medications as of 11/10/2019  Medication Sig Note  . Accu-Chek Softclix Lancets lancets Use as instructed   . albuterol (VENTOLIN HFA) 108 (90 Base) MCG/ACT inhaler Inhale 1-2 puffs into the lungs every 6 (six) hours as needed for wheezing or shortness of breath. 09/11/2019: Uses rarely   . amLODipine (NORVASC) 10 MG tablet Take 1 tablet (10 mg total) by mouth daily. For high blood pressure (Patient taking differently: Take 10 mg by mouth at bedtime. )   . ferrous sulfate 325 (65 FE) MG tablet Take 1 tablet (325 mg total) by mouth every other day. (May buy from over the counter): For anemia   . insulin glargine (LANTUS) 100 UNIT/ML injection Inject 0.14 mLs (14 Units total) into the skin daily. For diabetes management   . losartan (COZAAR) 50 MG tablet Take 1 tablet (50 mg total) by mouth daily.   Marland Kitchen lovastatin (MEVACOR) 40 MG tablet Take 1 tablet (40 mg total) by mouth every evening.   . metFORMIN (GLUCOPHAGE) 1000 MG tablet Take 1 tablet (1,000 mg total) by mouth 2 (two) times daily with a meal.   . metoprolol  tartrate (LOPRESSOR) 100 MG tablet Take 1 tablet (100 mg total) by mouth 2 (two) times daily.   . mirtazapine (REMERON) 15 MG tablet Take 1 tablet (15 mg total) by mouth at bedtime. For depression/sleep   . nicotine (NICODERM CQ - DOSED IN MG/24 HR) 7 mg/24hr patch Place 1 patch (7 mg total) onto the skin daily. (Patient not taking: Reported on 10/10/2019)   . paliperidone (INVEGA) 6 MG 24 hr tablet Take 1 tablet (6 mg total) by mouth at bedtime. For mood control   . senna-docusate (SENOKOT-S) 8.6-50 MG tablet Take 1 tablet by mouth at bedtime as needed. (May buy from over the counter): For constipation   . sertraline (ZOLOFT) 100 MG tablet Take 1 tablet (100 mg total) by mouth daily. For depression   . spironolactone (ALDACTONE) 50 MG tablet Take 1 tablet (50 mg total) by mouth daily.   . Thiamine HCl (VITAMIN B-1) 100 MG TABS Take 1 tablet (100 mg total) by mouth daily.    No facility-administered encounter medications on file as of 11/10/2019.     Objective:  Wt Readings from Last 3 Encounters:  10/09/19 156 lb 8.4 oz (71 kg)  08/19/19 157 lb 12.8 oz (71.6 kg)  06/18/19 171 lb 8.3 oz (77.8 kg)    Goals Addressed              This Visit's Progress     Patient Stated   .  "I'm having night sweats. Could it be a side effect of one  of my medicines? (pt-stated)        CARE PLAN ENTRY (see longitudinal plan of care for additional care plan information)  Current Barriers:  Marland Kitchen Knowledge Deficits related to etiology of recent onset of night sweats - returned call to patient after she left message earlier today. She is reporting night sweats that result in her bed linens and bed clothes wet with perspiration, she says she started a new psychiatric medicine Invega about 3 weeks ago . She denies fever, chills or myalgia  Nurse Case Manager Clinical Goal(s):  Marland Kitchen Over the next 7 days, patient will verbalize understanding of plan to contact her psychiatrist about night sweats  Interventions:    . Inter-disciplinary care team collaboration (see longitudinal plan of care) . Advised patient to call her psychaitrst and report her sympoms  Patient Self Care Activities:  . Patient verbalizes understanding of plan to contact her psychiatrist . Does not contact provider office for questions/concerns  Initial goal documentation     .  'I'm feeling better. I check my blood pressure 2-3 times a day but I haven't  checked my blood sugar since I left the hospital" (pt-stated)        [place] (see longitudinal plan of care for additional care plan information)  Current Barriers:  . Chronic Disease Management support, education, and care coordination needs related to HTN, HLD, COPD, DMII, Bipolar Disorder, and Schizoaffective Disorder - patient reports her blood pressure and blood sugar readings are good, (her Mom checks her blood pressure)  Clinical Goal(s) related to HTN, HLD, COPD, DMII, Bipolar Disorder, and Schizoaffective Disorder Over the next 30 -60 days, patient will:  . Work with the care management team to address educational, disease management, and care coordination needs  . Begin or continue self health monitoring activities as directed today Measure and record cbg (blood glucose) 1 times daily, Measure and record blood pressure 1-2 times per week . Call provider office for new or worsened signs and symptoms Blood glucose findings outside established parameters, Blood pressure findings outside established parameters, Shortness of breath, and New or worsened symptom related to HTN, HLD, COPD, DMII, Bipolar Disorder, and Schizoaffective Disorder . Call care management team with questions or concerns . Verbalize basic understanding of patient centered plan of care established today  Interventions related to HTN, HLD, COPD, DMII, Bipolar Disorder, and Schizoaffective Disorder  . Evaluation of current treatment plans and patient's adherence to plan as established by  provider . Assessed patient understanding of disease states . Assessed patient's education and care coordination needs . Collaborated with appropriate clinical care team members regarding patient needs  Patient Self Care Activities related to HTN, HLD, COPD, DMII, Bipolar Disorder, and Schizoaffective Disorder:  . Patient is unable to independently self-manage chronic health conditions  Please see past updates related to this goal by clicking on the "Past Updates" button in the selected goal        Other   .  HEMOGLOBIN A1C < 7        Lab Results  Component Value Date   HGBA1C 7.2 (A) 08/19/2019    Not meeting target A1C    .  LDL CALC < 100        Lab Results  Component Value Date   CHOL 86 06/05/2019   HDL 28 (L) 06/05/2019   LDLCALC 34 06/05/2019   TRIG 118 06/05/2019   CHOLHDL 3.1 06/05/2019  Meeting LDL target    .  Weight < 195  lb (88.451 kg)        Wt Readings from Last 3 Encounters:  08/19/19 157 lb 12.8 oz (71.6 kg)  06/18/19 171 lb 8.3 oz (77.8 kg)  06/17/19 171 lb 9.6 oz (77.8 kg)   Meeting weight target        Plan:   The care management team will reach out to the patient again over the next 30-60 days.   Kelli Churn RN, CCM, [facility] RN Care Manager [phone]

## 2019-11-11 ENCOUNTER — Ambulatory Visit: Payer: Self-pay | Admitting: *Deleted

## 2019-11-11 ENCOUNTER — Encounter (HOSPITAL_COMMUNITY): Payer: Self-pay | Admitting: Psychiatry

## 2019-11-11 ENCOUNTER — Telehealth (INDEPENDENT_AMBULATORY_CARE_PROVIDER_SITE_OTHER): Payer: Medicare Other | Admitting: Psychiatry

## 2019-11-11 ENCOUNTER — Other Ambulatory Visit (HOSPITAL_COMMUNITY): Payer: Self-pay | Admitting: *Deleted

## 2019-11-11 ENCOUNTER — Other Ambulatory Visit: Payer: Self-pay

## 2019-11-11 VITALS — Wt 151.0 lb

## 2019-11-11 DIAGNOSIS — F250 Schizoaffective disorder, bipolar type: Secondary | ICD-10-CM | POA: Diagnosis not present

## 2019-11-11 DIAGNOSIS — J449 Chronic obstructive pulmonary disease, unspecified: Secondary | ICD-10-CM

## 2019-11-11 DIAGNOSIS — F319 Bipolar disorder, unspecified: Secondary | ICD-10-CM

## 2019-11-11 DIAGNOSIS — F419 Anxiety disorder, unspecified: Secondary | ICD-10-CM | POA: Diagnosis not present

## 2019-11-11 DIAGNOSIS — Z794 Long term (current) use of insulin: Secondary | ICD-10-CM

## 2019-11-11 DIAGNOSIS — I5032 Chronic diastolic (congestive) heart failure: Secondary | ICD-10-CM

## 2019-11-11 MED ORDER — PALIPERIDONE ER 3 MG PO TB24: 3.0000 mg | ORAL_TABLET | Freq: Every day | ORAL | 0 refills | Status: DC

## 2019-11-11 MED ORDER — PALIPERIDONE PALMITATE ER 156 MG/ML IM SUSY
156.0000 mg | PREFILLED_SYRINGE | Freq: Once | INTRAMUSCULAR | Status: AC
  Administered 2019-11-12: 156 mg via INTRAMUSCULAR

## 2019-11-11 NOTE — Progress Notes (Signed)
Internal Medicine Clinic Attending  CCM services provided by the care management provider and their documentation were discussed with Dr. Marva Panda. We reviewed the pertinent findings, urgent action items addressed by the resident and non-urgent items to be addressed by the PCP.  I agree with the assessment, diagnosis, and plan of care documented in the CCM and resident's note.  Axel Filler, MD 11/11/2019

## 2019-11-11 NOTE — Progress Notes (Signed)
Internal Medicine Clinic Resident  I have personally reviewed this encounter including the documentation in this note and/or discussed this patient with the care management provider. I will address any urgent items identified by the care management provider and will communicate my actions to the patient's PCP. I have reviewed the patient's CCM visit with my supervising attending, Dr Evette Doffing.  Harvie Heck, MD  [facility] PGY-2 11/11/2019

## 2019-11-11 NOTE — Progress Notes (Signed)
Internal Medicine Clinic Attending  CCM services provided by the care management provider and their documentation were discussed with Dr. Aslam. We reviewed the pertinent findings, urgent action items addressed by the resident and non-urgent items to be addressed by the PCP.  I agree with the assessment, diagnosis, and plan of care documented in the CCM and resident's note.  Nykeria Mealing Thomas Zhoe Catania, MD 11/11/2019  

## 2019-11-11 NOTE — Progress Notes (Signed)
Virtual Visit via Telephone Note  I connected with [name] on 11/11/19 at 11:20 AM EDT by telephone and verified that I am speaking with the correct person using two identifiers.  Location: Patient: mother`s home Provider: Home Office   I discussed the limitations, risks, security and privacy concerns of performing an evaluation and management service by telephone and the availability of in person appointments. I also discussed with the patient that there may be a patient responsible charge related to this service. The patient expressed understanding and agreed to proceed.   History of Present Illness: Patient is evaluated by phone session.  I tried to call her phone but she did not pick up the phone and I called mother who is involved in her treatment plan.  Patient mother told that patient is physically with her and she gave phone to the patient.  Patient was recently admitted to behavioral [place] on June 28 and discharged on July 2.  Apparently she was not taking the medication when she was in [place].  In the beginning patient denied but later accepted that she had missed 2 days of medication.  In the behavioral [place] she was given Invega injection.  As per mother she is doing well and she is more motivated but is still have episodes of irritability, paranoia.  Upon admission she was very hysterical, crying and having agitation and threatening to hit other people.  The patient denied the symptoms but realized that she may be sick.  She is no longer taking Trilafon, Cogentin.  Her Zoloft and mirtazapine remain unchanged.  The patient doing better on Invega but she noticed excessive sweating and getting wet at night.  She is not sure if the new medicine causes this but recall it started recently.  She denies any suicidal thoughts.  She is staying with her mother's house and mother wants to keep her until she get more stable.  She had a good support from her mother, daughter and  son.  She has no other concerns or side effects.   Past Psychiatric History: H/Oschizoaffective d/owith at least 10inpatient and multiple suicidal attempt. Most significant was jumping from running car and overdose on chlorine bleach dripandhypertensive medication. Last inpatient in June 2021 at [facility] H/O f/u at [facility] for many years. Geodon helped. Took Depakote, Trazodone, perphenazine, temazepam, mirtazapine. Last inpatient switched to [facility].    Recent Results (from the past 2160 hour(s))  Glucose, capillary     Status: Abnormal   Collection Time: 08/19/19 10:43 AM  Result Value Ref Range   Glucose-Capillary 196 (H) 70 - 99 mg/dL    Comment: Glucose reference range applies only to samples taken after fasting for at least 8 hours.  POC Hbg A1C     Status: Abnormal   Collection Time: 08/19/19 10:54 AM  Result Value Ref Range   Hemoglobin A1C 7.2 (A) 4.0 - 5.6 %   HbA1c POC (<> result, manual entry)     HbA1c, POC (prediabetic range)     HbA1c, POC (controlled diabetic range)    CBC     Status: None   Collection Time: 10/10/19 12:23 AM  Result Value Ref Range   WBC 8.7 4.0 - 10.5 K/uL   RBC 4.99 3.87 - 5.11 MIL/uL   Hemoglobin 14.6 12.0 - 15.0 g/dL   HCT 43.7 36 - 46 %   MCV 87.6 80.0 - 100.0 fL   MCH 29.3 26.0 - 34.0 pg   MCHC 33.4 30.0 -  36.0 g/dL   RDW 14.8 11.5 - 15.5 %   Platelets 222 150 - 400 K/uL   nRBC 0.0 0.0 - 0.2 %    Comment: Performed at [facility], [place] [address], [place], [place] [address]  Comprehensive metabolic panel     Status: Abnormal   Collection Time: 10/10/19 12:23 AM  Result Value Ref Range   Sodium 137 135 - 145 mmol/L   Potassium 4.0 3.5 - 5.1 mmol/L   Chloride 106 98 - 111 mmol/L   CO2 19 (L) 22 - 32 mmol/L   Glucose, Bld 179 (H) 70 - 99 mg/dL    Comment: Glucose reference range applies only to samples taken after fasting for at least 8 hours.   BUN 20 6 - 20 mg/dL   Creatinine, Ser 1.25 (H) 0.44 - 1.00 mg/dL    Calcium 10.0 8.9 - 10.3 mg/dL   Total Protein 7.5 6.5 - 8.1 g/dL   Albumin 4.4 3.5 - 5.0 g/dL   AST 12 (L) 15 - 41 U/L   ALT 11 0 - 44 U/L   Alkaline Phosphatase 66 38 - 126 U/L   Total Bilirubin 0.5 0.3 - 1.2 mg/dL   GFR calc non Af Amer 50 (L) >60 mL/min   GFR calc Af Amer 58 (L) >60 mL/min   Anion gap 12 5 - 15    Comment: Performed at [facility], [place] [address] [place], [place] [address]  Ethanol     Status: None   Collection Time: 10/10/19 12:23 AM  Result Value Ref Range   Alcohol, Ethyl (B) <10 <10 mg/dL    Comment: (NOTE) Lowest detectable limit for serum alcohol is 10 mg/dL.  For medical purposes only. Performed at [facility], [place] [address], [place], [place] [address]   Rapid urine drug screen (hospital performed)     Status: None   Collection Time: 10/10/19 12:23 AM  Result Value Ref Range   Opiates NONE DETECTED NONE DETECTED   Cocaine NONE DETECTED NONE DETECTED   Benzodiazepines NONE DETECTED NONE DETECTED   Amphetamines NONE DETECTED NONE DETECTED   Tetrahydrocannabinol NONE DETECTED NONE DETECTED   Barbiturates NONE DETECTED NONE DETECTED    Comment: (NOTE) DRUG SCREEN FOR MEDICAL PURPOSES ONLY.  IF CONFIRMATION IS NEEDED FOR ANY PURPOSE, NOTIFY LAB WITHIN 5 DAYS.  LOWEST DETECTABLE LIMITS FOR URINE DRUG SCREEN Drug Class                     Cutoff (ng/mL) Amphetamine and metabolites    1000 Barbiturate and metabolites    200 Benzodiazepine                 419 Tricyclics and metabolites     300 Opiates and metabolites        300 Cocaine and metabolites        300 THC                            50 Performed at [facility], [place] [address] [place], [place] [address]   Salicylate level     Status: Abnormal   Collection Time: 10/10/19 12:23 AM  Result Value Ref Range   Salicylate Lvl <4.0 (L) 7.0 - 30.0 mg/dL    Comment: Performed at [facility], [place] [address],  [place], [place] [address]  Acetaminophen level     Status: Abnormal  Collection Time: 10/10/19 12:23 AM  Result Value Ref Range   Acetaminophen (Tylenol), Serum <10 (L) 10 - 30 ug/mL    Comment: (NOTE) Therapeutic concentrations vary significantly. A range of 10-30 ug/mL  may be an effective concentration for many patients. However, some  are best treated at concentrations outside of this range. Acetaminophen concentrations >150 ug/mL at 4 hours after ingestion  and >50 ug/mL at 12 hours after ingestion are often associated with  toxic reactions.  Performed at [facility], [place] [address] [place], [place] [address]   SARS Coronavirus 2 by RT PCR (hospital order, performed in [facility] hospital lab) Nasopharyngeal Nasopharyngeal Swab     Status: None   Collection Time: 10/10/19 12:28 AM   Specimen: Nasopharyngeal Swab  Result Value Ref Range   SARS Coronavirus 2 NEGATIVE NEGATIVE    Comment: (NOTE) SARS-CoV-2 target nucleic acids are NOT DETECTED.  The SARS-CoV-2 RNA is generally detectable in upper and lower respiratory specimens during the acute phase of infection. The lowest concentration of SARS-CoV-2 viral copies this assay can detect is 250 copies / mL. A negative result does not preclude SARS-CoV-2 infection and should not be used as the sole basis for treatment or other patient management decisions.  A negative result may occur with improper specimen collection / handling, submission of specimen other than nasopharyngeal swab, presence of viral mutation(s) within the areas targeted by this assay, and inadequate number of viral copies (<250 copies / mL). A negative result must be combined with clinical observations, patient history, and epidemiological information.  Fact Sheet for Patients:   StrictlyIdeas.no  Fact Sheet for Healthcare Providers: BankingDealers.co.za  This test is not yet approved or   cleared by the [place] FDA and has been authorized for detection and/or diagnosis of SARS-CoV-2 by FDA under an Emergency Use Authorization (EUA).  This EUA will remain in effect (meaning this test can be used) for the duration of the COVID-19 declaration under Section 564(b)(1) of the Act, 21 U.S.C. section 360bbb-3(b)(1), unless the authorization is terminated or revoked sooner.  Performed at [facility], [place] [address], [place],  [address]   I-Stat beta hCG blood, ED     Status: None   Collection Time: 10/10/19  1:04 AM  Result Value Ref Range   I-stat hCG, quantitative <5.0 <5 mIU/mL   Comment 3            Comment:   GEST. AGE      CONC.  (mIU/mL)   <=1 WEEK        5 - 50     2 WEEKS       50 - 500     3 WEEKS       100 - 10,000     4 WEEKS     1,000 - 30,000        FEMALE AND NON-PREGNANT FEMALE:     LESS THAN 5 mIU/mL   CBG monitoring, ED     Status: Abnormal   Collection Time: 10/11/19 11:59 AM  Result Value Ref Range   Glucose-Capillary 197 (H) 70 - 99 mg/dL    Comment: Glucose reference range applies only to samples taken after fasting for at least 8 hours.  CBG monitoring, ED     Status: Abnormal   Collection Time: 10/12/19  7:35 AM  Result Value Ref Range   Glucose-Capillary 134 (H) 70 - 99 mg/dL    Comment: Glucose reference range applies only  to samples taken after fasting for at least 8 hours.  Glucose, capillary     Status: Abnormal   Collection Time: 10/12/19  7:41 PM  Result Value Ref Range   Glucose-Capillary 218 (H) 70 - 99 mg/dL    Comment: Glucose reference range applies only to samples taken after fasting for at least 8 hours.  Glucose, capillary     Status: Abnormal   Collection Time: 10/13/19  6:19 AM  Result Value Ref Range   Glucose-Capillary 165 (H) 70 - 99 mg/dL    Comment: Glucose reference range applies only to samples taken after fasting for at least 8 hours.  Glucose, capillary     Status: Abnormal    Collection Time: 10/13/19 11:40 AM  Result Value Ref Range   Glucose-Capillary 160 (H) 70 - 99 mg/dL    Comment: Glucose reference range applies only to samples taken after fasting for at least 8 hours.  Glucose, capillary     Status: Abnormal   Collection Time: 10/13/19  4:58 PM  Result Value Ref Range   Glucose-Capillary 195 (H) 70 - 99 mg/dL    Comment: Glucose reference range applies only to samples taken after fasting for at least 8 hours.  Glucose, capillary     Status: Abnormal   Collection Time: 10/13/19  8:15 PM  Result Value Ref Range   Glucose-Capillary 162 (H) 70 - 99 mg/dL    Comment: Glucose reference range applies only to samples taken after fasting for at least 8 hours.  Glucose, capillary     Status: Abnormal   Collection Time: 10/14/19  5:44 AM  Result Value Ref Range   Glucose-Capillary 148 (H) 70 - 99 mg/dL    Comment: Glucose reference range applies only to samples taken after fasting for at least 8 hours.  Glucose, capillary     Status: Abnormal   Collection Time: 10/14/19 12:07 PM  Result Value Ref Range   Glucose-Capillary 102 (H) 70 - 99 mg/dL    Comment: Glucose reference range applies only to samples taken after fasting for at least 8 hours.  Glucose, capillary     Status: Abnormal   Collection Time: 10/14/19  4:45 PM  Result Value Ref Range   Glucose-Capillary 311 (H) 70 - 99 mg/dL    Comment: Glucose reference range applies only to samples taken after fasting for at least 8 hours.  Glucose, capillary     Status: Abnormal   Collection Time: 10/14/19  8:15 PM  Result Value Ref Range   Glucose-Capillary 47 (L) 70 - 99 mg/dL    Comment: Glucose reference range applies only to samples taken after fasting for at least 8 hours.   Comment 1 Notify RN   Glucose, capillary     Status: Abnormal   Collection Time: 10/14/19  8:30 PM  Result Value Ref Range   Glucose-Capillary 46 (L) 70 - 99 mg/dL    Comment: Glucose reference range applies only to samples taken  after fasting for at least 8 hours.   Comment 1 Notify RN   Glucose, capillary     Status: None   Collection Time: 10/14/19  8:48 PM  Result Value Ref Range   Glucose-Capillary 99 70 - 99 mg/dL    Comment: Glucose reference range applies only to samples taken after fasting for at least 8 hours.  Glucose, capillary     Status: Abnormal   Collection Time: 10/15/19  6:01 AM  Result Value Ref Range   Glucose-Capillary 120 (H)  70 - 99 mg/dL    Comment: Glucose reference range applies only to samples taken after fasting for at least 8 hours.  Glucose, capillary     Status: Abnormal   Collection Time: 10/15/19 11:33 AM  Result Value Ref Range   Glucose-Capillary 207 (H) 70 - 99 mg/dL    Comment: Glucose reference range applies only to samples taken after fasting for at least 8 hours.  Glucose, capillary     Status: Abnormal   Collection Time: 10/15/19  4:26 PM  Result Value Ref Range   Glucose-Capillary 339 (H) 70 - 99 mg/dL    Comment: Glucose reference range applies only to samples taken after fasting for at least 8 hours.  Comprehensive metabolic panel     Status: Abnormal   Collection Time: 10/15/19  6:13 PM  Result Value Ref Range   Sodium 137 135 - 145 mmol/L   Potassium 4.6 3.5 - 5.1 mmol/L   Chloride 104 98 - 111 mmol/L   CO2 24 22 - 32 mmol/L   Glucose, Bld 163 (H) 70 - 99 mg/dL    Comment: Glucose reference range applies only to samples taken after fasting for at least 8 hours.   BUN 27 (H) 6 - 20 mg/dL   Creatinine, Ser 1.19 (H) 0.44 - 1.00 mg/dL   Calcium 9.8 8.9 - 10.3 mg/dL   Total Protein 7.5 6.5 - 8.1 g/dL   Albumin 4.4 3.5 - 5.0 g/dL   AST 11 (L) 15 - 41 U/L   ALT 11 0 - 44 U/L   Alkaline Phosphatase 83 38 - 126 U/L   Total Bilirubin 0.3 0.3 - 1.2 mg/dL   GFR calc non Af Amer 53 (L) >60 mL/min   GFR calc Af Amer >60 >60 mL/min   Anion gap 9 5 - 15    Comment: Performed at [facility], [place] [address], [place], [place] [address]  Glucose,  capillary     Status: Abnormal   Collection Time: 10/15/19  7:36 PM  Result Value Ref Range   Glucose-Capillary 112 (H) 70 - 99 mg/dL    Comment: Glucose reference range applies only to samples taken after fasting for at least 8 hours.  Glucose, capillary     Status: Abnormal   Collection Time: 10/16/19  5:56 AM  Result Value Ref Range   Glucose-Capillary 131 (H) 70 - 99 mg/dL    Comment: Glucose reference range applies only to samples taken after fasting for at least 8 hours.   Comment 1 Notify RN    Comment 2 Document in Chart      Psychiatric Specialty Exam: Physical Exam  Review of Systems  Weight 151 lb (68.5 kg).There is no height or weight on file to calculate BMI.  General Appearance: NA  Eye Contact:  NA  Speech:  Slow  Volume:  Decreased  Mood:  Irritable  Affect:  NA  Thought Process:  Descriptions of Associations: Intact  Orientation:  Full (Time, Place, and Person)  Thought Content:  Paranoid Ideation  Suicidal Thoughts:  No  Homicidal Thoughts:  No  Memory:  Immediate;   Fair Recent;   Fair Remote;   Fair  Judgement:  Fair  Insight:  Shallow  Psychomotor Activity:  NA  Concentration:  Concentration: Fair and Attention Span: Fair  Recall:  [employer] of Knowledge:  Fair  Language:  Good  Akathisia:  No  Handed:  Right  AIMS (if indicated):     Assets:  Desire for Improvement Housing Social Support  ADL's:  Intact  Cognition:  WNL  Sleep:   ok      Assessment and Plan: Schizoaffective disorder, bipolar type.  Anxiety.  I review discharge summary, blood work results, current medication.  She is no longer taking Trilafon, Cogentin.  She started on Invega injection 234 mg which was given on June 29th in the hospital but she does not have any follow-up appointment for second injection.  She is also taking Invega 6 mg, mirtazapine, Zoloft.  I discussed medication side effects and sweating could be the side effects of mirtazapine.  I recommend to  discontinue mirtazapine but she also like to cut down the dose of Invega.  Since patient doing better and mother endorsed improvement with injection and helping risk of noncompliance with medication we will keep the injection but we will decrease the dose.  She will receive Invega injection either today or tomorrow which will be 156 mg.  I also recommend to cut down the Invega pills to 3 mg but keep Zoloft 100 mg daily.  Patient is not interested in therapy.  We talked in detail about poor compliance with medication and resulting in psychosis and decompensation.  At this time patient had a support from her mother and she is staying at [facility].  We will follow-up in 3 weeks if patient concerned about the night sweats resolved otherwise we will switch the medication.  We also talked about multiple hospitalization and history of noncompliance may require high level of care and  ACT is option in the future.  At this time patient and the mother like to keep the appointment with outpatient but realized if patient is not consistent with the treatment plan medication may require higher level of care.  Follow-up in 3 weeks.  Recommended to call us back if she has any question, concern or acutely worsening of the symptoms.  I have called nurse to schedule Invega injection 156 mg to be given today or tomorrow.     Follow Up Instructions:    I discussed the assessment and treatment plan with the patient. The patient was provided an opportunity to ask questions and all were answered. The patient agreed with the plan and demonstrated an understanding of the instructions.   The patient was advised to call back or seek an in-person evaluation if the symptoms worsen or if the condition fails to improve as anticipated.  I provided 35 minutes of non-face-to-face time during this encounter.   Kathlee Nations, MD

## 2019-11-11 NOTE — Chronic Care Management (AMB) (Signed)
  Care Management   Follow Up Note   11/11/2019 Name: [name] MRN: [id number] DOB: [birth year]  Referred by: Rachel Pope, MD Reason for referral : Care Coordination (DM, HTN, HLD)   [name] is a 54 y.o. year old female who is a primary care patient of [name], [name], MD. The care management team was consulted for assistance with care management and care coordination needs.    Review of patient status, including review of consultants reports, relevant laboratory and other test results, and collaboration with appropriate care team members and the patient's provider was performed as part of comprehensive patient evaluation and provision of chronic care management services.    SDOH (Social Determinants of Health) assessments performed: No See Care Plan activities for detailed interventions related to [name])     Advanced Directives: See Care Plan and Vynca application for related entries.   Goals Addressed              This Visit's Progress     Patient Stated   .  COMPLETED: "I'm having night sweats. Could it be a side effect of one of my medicines? (pt-stated)        CARE PLAN ENTRY (see longitudinal plan of care for additional care plan information)  Current Barriers:  Marland Kitchen Knowledge Deficits related to etiology of recent onset of night sweats - called patient , she states she notified her psychiatrist about her night sweats and he discontinued a medicine that the thinks is causing the adverse side effect, patient could not state the name of the medicine but said her mother would know. She states she does not need OP PT as referral was made in June by her primary care provider  Nurse Case Manager Clinical Goal(s):  Marland Kitchen Over the next 7 days, patient will verbalize understanding of plan to contact her psychiatrist about night sweats  Interventions:  . Inter-disciplinary care team collaboration (see longitudinal plan of care) . Called patient to confirm  that she notified her psychiatrist . Also asked her if she wants to pursue OP PT that was ordered by her primary care provider in June; notified [name] Clinic Nurse Manager to cancel referral per patient request.  Patient Self Care Activities:  . Patient verbalizes understanding of plan to contact her psychiatrist . Does not contact provider office for questions/concerns  Initial goal documentation         The care management team will reach out to the patient again over the next 30 days.   Kelli Churn RN, CCM, [facility] RN Care Manager [phone]

## 2019-11-12 ENCOUNTER — Ambulatory Visit (INDEPENDENT_AMBULATORY_CARE_PROVIDER_SITE_OTHER): Payer: Medicare Other | Admitting: *Deleted

## 2019-11-12 ENCOUNTER — Other Ambulatory Visit: Payer: Self-pay

## 2019-11-12 DIAGNOSIS — F3110 Bipolar disorder, current episode manic without psychotic features, unspecified: Secondary | ICD-10-CM

## 2019-11-12 NOTE — Progress Notes (Signed)
Patient came for monthly injection of [place] 156mg . Given in Right Upper Outer quadrant. Tolerated without complaint. Did mention that the last injection she had made her feel tired. BP 126/76 with heartrate 79.

## 2019-11-16 ENCOUNTER — Other Ambulatory Visit (HOSPITAL_COMMUNITY): Payer: Self-pay | Admitting: *Deleted

## 2019-11-16 ENCOUNTER — Telehealth (HOSPITAL_COMMUNITY): Payer: Self-pay | Admitting: *Deleted

## 2019-11-16 DIAGNOSIS — F419 Anxiety disorder, unspecified: Secondary | ICD-10-CM

## 2019-11-16 MED ORDER — MIRTAZAPINE 15 MG PO TABS: 15.0000 mg | ORAL_TABLET | Freq: Every day | ORAL | 0 refills | Status: DC

## 2019-11-16 NOTE — Telephone Encounter (Signed)
She can restart Remeron but she was having sweating with Remeron.  Not sure if she is taking Invega 3 mg.  If she is taking 3 mg and is still not feeling well then increase to 6 mg.  She need to continue Invega injection.

## 2019-11-16 NOTE — Telephone Encounter (Signed)
It was send to her pharmacy on her last visit. Try calling her mother.

## 2019-11-16 NOTE — Telephone Encounter (Signed)
Placed call to patients mother. Mother stated that patient had become more paranoid and not sleeping. Mother reports patient stands over family at night and watches them. Mom reports patient will not leave the house even to sit on the porch.  Mom reports when patient was taking Remeron she slept better. Mother requesting that patient restart Remeron. pt appointment 12/02/19. Please review and advise.

## 2019-11-16 NOTE — Telephone Encounter (Signed)
Received phone call from pt asking about Rx for Invega 3 mg p.o sent  on 7/28/. Writer returned call but VM full.

## 2019-11-19 ENCOUNTER — Telehealth (HOSPITAL_COMMUNITY): Payer: Self-pay | Admitting: *Deleted

## 2019-11-19 ENCOUNTER — Ambulatory Visit: Payer: Medicare Other | Admitting: *Deleted

## 2019-11-19 DIAGNOSIS — I1A0 Resistant hypertension: Secondary | ICD-10-CM

## 2019-11-19 DIAGNOSIS — I10 Essential (primary) hypertension: Secondary | ICD-10-CM

## 2019-11-19 DIAGNOSIS — E119 Type 2 diabetes mellitus without complications: Secondary | ICD-10-CM

## 2019-11-19 DIAGNOSIS — Z794 Long term (current) use of insulin: Secondary | ICD-10-CM

## 2019-11-19 DIAGNOSIS — J449 Chronic obstructive pulmonary disease, unspecified: Secondary | ICD-10-CM

## 2019-11-19 DIAGNOSIS — I5032 Chronic diastolic (congestive) heart failure: Secondary | ICD-10-CM

## 2019-11-19 NOTE — Progress Notes (Signed)
Internal Medicine Clinic Resident  I have personally reviewed this encounter including the documentation in this note and/or discussed this patient with the care management provider. I will address any urgent items identified by the care management provider and will communicate my actions to the patient's PCP. I have reviewed the patient's CCM visit with my supervising attending, Dr Mullen.  Josepha Barbier, MD 11/19/2019    

## 2019-11-19 NOTE — Telephone Encounter (Signed)
Writer returned another t/c from mother regarding pt not sleeping. Writer reiterated that the Remeron 15mg  has been sent in their preferred pharmacy. Mother verbalized understanding.

## 2019-11-19 NOTE — Chronic Care Management (AMB) (Signed)
Chronic Care Management   Follow Up Note   11/19/2019 Name: [name] MRN: [id number] DOB: 09-Sep-1965  Referred by: Riesa Pope, MD Reason for referral : DM, HTN, HF, COPD   Eiliyah Reh Waltermire is a 54 y.o. year old female who is a primary care patient of [name], [name], MD. The CCM team was consulted for assistance with chronic disease management and care coordination needs.    Review of patient status, including review of consultants reports, relevant laboratory and other test results, and collaboration with appropriate care team members and the patient's provider was performed as part of comprehensive patient evaluation and provision of chronic care management services.    SDOH (Social Determinants of Health) assessments performed: No See Care Plan activities for detailed interventions related to [facility])     Outpatient Encounter Medications as of 11/19/2019  Medication Sig Note  . Accu-Chek Softclix Lancets lancets Use as instructed   . albuterol (VENTOLIN HFA) 108 (90 Base) MCG/ACT inhaler Inhale 1-2 puffs into the lungs every 6 (six) hours as needed for wheezing or shortness of breath. 09/11/2019: Uses rarely   . amLODipine (NORVASC) 10 MG tablet Take 1 tablet (10 mg total) by mouth daily. For high blood pressure (Patient taking differently: Take 10 mg by mouth at bedtime. )   . ferrous sulfate 325 (65 FE) MG tablet Take 1 tablet (325 mg total) by mouth every other day. (May buy from over the counter): For anemia   . insulin glargine (LANTUS) 100 UNIT/ML injection Inject 0.14 mLs (14 Units total) into the skin daily. For diabetes management   . losartan (COZAAR) 50 MG tablet Take 1 tablet (50 mg total) by mouth daily.   Marland Kitchen lovastatin (MEVACOR) 40 MG tablet Take 1 tablet (40 mg total) by mouth every evening.   . metFORMIN (GLUCOPHAGE) 1000 MG tablet Take 1 tablet (1,000 mg total) by mouth 2 (two) times daily with a meal.   . metoprolol tartrate (LOPRESSOR) 100 MG  tablet Take 1 tablet (100 mg total) by mouth 2 (two) times daily.   . mirtazapine (REMERON) 15 MG tablet Take 1 tablet (15 mg total) by mouth at bedtime.   . nicotine (NICODERM CQ - DOSED IN MG/24 HR) 7 mg/24hr patch Place 1 patch (7 mg total) onto the skin daily. (Patient not taking: Reported on 10/10/2019)   . paliperidone (INVEGA) 3 MG 24 hr tablet Take 1 tablet (3 mg total) by mouth daily.   Marland Kitchen senna-docusate (SENOKOT-S) 8.6-50 MG tablet Take 1 tablet by mouth at bedtime as needed. (May buy from over the counter): For constipation   . sertraline (ZOLOFT) 100 MG tablet Take 1 tablet (100 mg total) by mouth daily. For depression   . spironolactone (ALDACTONE) 50 MG tablet Take 1 tablet (50 mg total) by mouth daily.   . Thiamine HCl (VITAMIN B-1) 100 MG TABS Take 1 tablet (100 mg total) by mouth daily.    No facility-administered encounter medications on file as of 11/19/2019.     Objective:  Wt Readings from Last 3 Encounters:  10/09/19 156 lb 8.4 oz (71 kg)  08/19/19 157 lb 12.8 oz (71.6 kg)  06/18/19 171 lb 8.3 oz (77.8 kg)   BP Readings from Last 3 Encounters:  10/12/19 109/69  08/19/19 120/79  07/15/19 116/86   Lab Results  Component Value Date   HGBA1C 7.2 (A) 08/19/2019   HGBA1C 7.4 (H) 06/04/2019   HGBA1C 7.3 (A) 02/19/2019   Lab Results  Component Value Date  MICROALBUR 0.50 09/30/2013   LDLCALC 34 06/05/2019   CREATININE 1.19 (H) 10/15/2019    Goals Addressed              This Visit's Progress     Patient Stated   .  'I'm feeling better. I check my blood pressure 2-3 times a day but I haven't  checked my blood sugar since I left the hospital" (pt-stated)        [place] (see longitudinal plan of care for additional care plan information)  Current Barriers:  . Chronic Disease Management support, education, and care coordination needs related to HTN, HLD, COPD, DMII, Bipolar Disorder, and Schizoaffective Disorder - patient reports her blood pressure and  blood sugar readings are good, (her Mom checks her blood pressure), reports fasting blood sugars of 112-120 and BP readings of 120-124/80-92, c/o insomnia and asking about refill on temazepam  Clinical Goal(s) related to HTN, HLD, COPD, DMII, Bipolar Disorder, and Schizoaffective Disorder Over the next 30 -60 days, patient will:  . Work with the care management team to address educational, disease management, and care coordination needs  . Begin or continue self health monitoring activities as directed today Measure and record CBG (blood glucose) 1 times daily, Measure and record blood pressure 1-2 times per week . Call provider office for new or worsened signs and symptoms Blood glucose findings outside established parameters, Blood pressure findings outside established parameters, Shortness of breath, and New or worsened symptom related to HTN, HLD, COPD, DMII, Bipolar Disorder, and Schizoaffective Disorder . Call care management team with questions or concerns . Verbalize basic understanding of patient centered plan of care established today  Interventions related to HTN, HLD, COPD, DMII, Bipolar Disorder, and Schizoaffective Disorder  . Evaluation of current treatment plans and patient's adherence to plan as established by provider . Assessed patient understanding of disease states . Assessed patient's education and care coordination needs . Collaborated with appropriate clinical care team members regarding patient needs- messaged psychiatrist Dr Adele Schilder re: patient's c/o insomnia request for temazepam  Patient Self Care Activities related to HTN, HLD, COPD, DMII, Bipolar Disorder, and Schizoaffective Disorder:  . Patient is unable to independently self-manage chronic health conditions  Please see past updates related to this goal by clicking on the "Past Updates" button in the selected goal          Plan:   The care management team will reach out to the patient again over the next 30-60  days.    Kelli Churn RN, CCM, [facility] RN Care Manager [phone]

## 2019-11-19 NOTE — Patient Instructions (Signed)
Visit Information I will contact your psychiatrist about you c/o insomnia.  Goals Addressed              This Visit's Progress     Patient Stated   .  'I'm feeling better. I check my blood pressure 2-3 times a day but I haven't  checked my blood sugar since I left the hospital" (pt-stated)        [place] (see longitudinal plan of care for additional care plan information)  Current Barriers:  . Chronic Disease Management support, education, and care coordination needs related to HTN, HLD, COPD, DMII, Bipolar Disorder, and Schizoaffective Disorder - patient reports her blood pressure and blood sugar readings are good, (her Mom checks her blood pressure), reports fasting blood sugars of 112-120 and BP readings of 120-124/80-92, c/o insomnia and asking about refill on temazepam  Clinical Goal(s) related to HTN, HLD, COPD, DMII, Bipolar Disorder, and Schizoaffective Disorder Over the next 30 -60 days, patient will:  . Work with the care management team to address educational, disease management, and care coordination needs  . Begin or continue self health monitoring activities as directed today Measure and record CBG (blood glucose) 1 times daily, Measure and record blood pressure 1-2 times per week . Call provider office for new or worsened signs and symptoms Blood glucose findings outside established parameters, Blood pressure findings outside established parameters, Shortness of breath, and New or worsened symptom related to HTN, HLD, COPD, DMII, Bipolar Disorder, and Schizoaffective Disorder . Call care management team with questions or concerns . Verbalize basic understanding of patient centered plan of care established today  Interventions related to HTN, HLD, COPD, DMII, Bipolar Disorder, and Schizoaffective Disorder  . Evaluation of current treatment plans and patient's adherence to plan as established by provider . Assessed patient understanding of disease states . Assessed  patient's education and care coordination needs . Collaborated with appropriate clinical care team members regarding patient needs- messaged psychiatrist Dr Adele Schilder re: patient's c/o insomnia request for temazepam  Patient Self Care Activities related to HTN, HLD, COPD, DMII, Bipolar Disorder, and Schizoaffective Disorder:  . Patient is unable to independently self-manage chronic health conditions  Please see past updates related to this goal by clicking on the "Past Updates" button in the selected goal         The patient verbalized understanding of instructions provided today and declined a print copy of patient instruction materials.   The care management team will reach out to the patient again over the next 30-60 days.   Kelli Churn RN, CCM, [facility] RN Care Manager [phone]

## 2019-11-20 ENCOUNTER — Telehealth: Payer: Self-pay

## 2019-11-20 NOTE — Telephone Encounter (Signed)
Rtc, mailbox full

## 2019-11-20 NOTE — Telephone Encounter (Signed)
Requesting to speak with a nurse about something. Please call pt back.  °

## 2019-11-24 ENCOUNTER — Telehealth (HOSPITAL_COMMUNITY): Payer: Self-pay | Admitting: *Deleted

## 2019-11-24 NOTE — Telephone Encounter (Signed)
She should stay with Remeron 15 mg. We just called in on August 2. Did she picked up on August 2 if not then she can pick up the pharmacy. y

## 2019-11-24 NOTE — Telephone Encounter (Signed)
I am not sure why she is taking trazodone.  It is not on her medication list.  She should not be taking mirtazapine and trazodone together.  Please call the patient and clarify.

## 2019-11-24 NOTE — Telephone Encounter (Signed)
[facility] said it was filled recently and it is too early to refill so Mother is asking for Trazodone this time.

## 2019-11-24 NOTE — Telephone Encounter (Signed)
Patients mother called because pharmacy would not refill Remeron because it was too early.  Mom requested script for Trazadone which in the past she was prescribed by [facility]. Patient has an appt 8/18. Please review and advise.

## 2019-11-25 NOTE — Progress Notes (Signed)
Internal Medicine Clinic Attending  CCM services provided by the care management provider and their documentation were discussed with Dr. Allyson Sabal. We reviewed the pertinent findings, urgent action items addressed by the resident and non-urgent items to be addressed by the PCP.  I agree with the assessment, diagnosis, and plan of care documented in the CCM and resident's note.  Gilles Chiquito, MD 11/25/2019

## 2019-11-26 ENCOUNTER — Other Ambulatory Visit: Payer: Self-pay | Admitting: *Deleted

## 2019-11-26 MED ORDER — LOVASTATIN 40 MG PO TABS: 40.0000 mg | ORAL_TABLET | Freq: Every evening | ORAL | 0 refills | Status: DC

## 2019-11-30 ENCOUNTER — Telehealth (HOSPITAL_COMMUNITY): Payer: Medicare Other | Admitting: Psychiatry

## 2019-12-02 ENCOUNTER — Other Ambulatory Visit: Payer: Self-pay

## 2019-12-02 ENCOUNTER — Other Ambulatory Visit: Payer: Self-pay | Admitting: *Deleted

## 2019-12-02 ENCOUNTER — Encounter (HOSPITAL_COMMUNITY): Payer: Self-pay | Admitting: Psychiatry

## 2019-12-02 ENCOUNTER — Telehealth (INDEPENDENT_AMBULATORY_CARE_PROVIDER_SITE_OTHER): Payer: Medicare Other | Admitting: Psychiatry

## 2019-12-02 VITALS — Wt 151.0 lb

## 2019-12-02 DIAGNOSIS — F419 Anxiety disorder, unspecified: Secondary | ICD-10-CM | POA: Diagnosis not present

## 2019-12-02 DIAGNOSIS — F250 Schizoaffective disorder, bipolar type: Secondary | ICD-10-CM | POA: Diagnosis not present

## 2019-12-02 MED ORDER — INVEGA SUSTENNA 234 MG/1.5ML IM SUSY: 234.0000 mg | PREFILLED_SYRINGE | Freq: Once | INTRAMUSCULAR | 0 refills | Status: DC

## 2019-12-02 MED ORDER — METOPROLOL TARTRATE 100 MG PO TABS: 100.0000 mg | ORAL_TABLET | Freq: Two times a day (BID) | ORAL | 0 refills | Status: DC

## 2019-12-02 MED ORDER — METFORMIN HCL 1000 MG PO TABS: 1000.0000 mg | ORAL_TABLET | Freq: Two times a day (BID) | ORAL | 0 refills | Status: DC

## 2019-12-02 MED ORDER — PALIPERIDONE ER 3 MG PO TB24: 3.0000 mg | ORAL_TABLET | Freq: Every day | ORAL | 0 refills | Status: DC

## 2019-12-02 MED ORDER — TRAZODONE HCL 50 MG PO TABS: 50.0000 mg | ORAL_TABLET | Freq: Every day | ORAL | 1 refills | Status: DC

## 2019-12-02 MED ORDER — SERTRALINE HCL 100 MG PO TABS: 100.0000 mg | ORAL_TABLET | Freq: Every day | ORAL | 0 refills | Status: DC

## 2019-12-02 NOTE — Progress Notes (Signed)
Virtual Visit via Telephone Note  I connected with [name] on 12/02/19 at  1:20 PM EDT by telephone and verified that I am speaking with the correct person using two identifiers.  Location: Patient: home Provider: home office   I discussed the limitations, risks, security and privacy concerns of performing an evaluation and management service by telephone and the availability of in person appointments. I also discussed with the patient that there may be a patient responsible charge related to this service. The patient expressed understanding and agreed to proceed.   History of Present Illness: Patient is evaluated by phone session.  Her mother was also available.  Her mother reported that she is not sleeping very well and does not go outside or motivated to do things.  However patient got upset when mother was talking about the symptoms and she told that she is doing well and she is fine.  She admitted lately having UTI symptoms and that is why she is not sleeping well.  During the session patient and her mother were argument but later patient calmed down.  Her mother had called multiple times about insomnia medication.  We had tried mirtazapine but it caused sweating and recommended to discontinue.  Few days later patient mother called again requesting again mirtazapine but if she was told that it was discontinued because of sweating.  Patient was discharged on trazodone and patient and her mother like to consider low-dose trazodone so she can sleep better.  Patient denies any paranoia, hallucination but endorsed ruminative thoughts and irritability.  Patient endorsed that she is 54 year old old and like to live her own life and does not like her mother to tell her to do things.  Patient denies any suicidal thoughts.  She is compliant with Zoloft, oral Invega 3 mg and Invega sustained injection 156 mg every 4 weeks.  Patient mother reported there has been no recent aggression, violence.   Patient denies any suicidal thoughts.  However she appears irritable on the phone.  Her last injection was given on July 28.  She is due next injection on August 26.  Past Psychiatric History: H/Oschizoaffective d/owith at least 10inpatient and multiple suicidal attempt. Most significant was jumping from running car and overdose on chlorine bleach dripandhypertensive medication. Last inpatient in June 2021 at [facility]. H/O f/u at [facility] for many years. Geodon helped. Took Depakote, Trazodone, perphenazine, temazepam, mirtazapine. Last inpatient switched to [facility].    Psychiatric Specialty Exam: Physical Exam  Review of Systems  Weight 151 lb (68.5 kg).There is no height or weight on file to calculate BMI.  General Appearance: NA  Eye Contact:  NA  Speech:  Slow  Volume:  Decreased  Mood:  Irritable  Affect:  NA  Thought Process:  Descriptions of Associations: Intact  Orientation:  Full (Time, Place, and Person)  Thought Content:  Paranoid Ideation and Rumination  Suicidal Thoughts:  No  Homicidal Thoughts:  No  Memory:  Immediate;   Fair Recent;   Fair Remote;   Fair  Judgement:  Fair  Insight:  Shallow  Psychomotor Activity:  NA  Concentration:  Concentration: Fair and Attention Span: Fair  Recall:  [employer] of Knowledge:  Fair  Language:  Fair  Akathisia:  No  Handed:  Right  AIMS (if indicated):     Assets:  Communication Skills Desire for Improvement Housing Social Support  ADL's:  Intact  Cognition:  Impaired,  Mild  Sleep:   poor  Assessment and Plan: Schizoaffective disorder, bipolar type with anxiety.  I had a long discussion with the patient's mother and patient about her medication, messages and symptoms.  Explained that she should not be taking mirtazapine if she it has caused sweating and side effects.  I do believe patient has underlying personality symptoms and she has difficulty relating with her mother and they get into arguments.  We have  recommended ACT due to multiple hospitalization, noncompliance with medication however family like to keep this provider and office for now.  I will increase [place] injection to 234mg  intramuscular and keep Invega 3 mg by mouth for now.  We will consider stopping Invega by mouth on the next appointment.  Continue sertraline 100 mg at bedtime.  We will start trazodone 50 mg to take as needed for insomnia.  Discussed in detail medication side effects, benefits.  I recommend she should see PCP for blood work and rule out UTI as patient complaining of bladder symptoms that are keeping her up at night.  Follow-up in 4 weeks.  Follow Up Instructions:    I discussed the assessment and treatment plan with the patient. The patient was provided an opportunity to ask questions and all were answered. The patient agreed with the plan and demonstrated an understanding of the instructions.   The patient was advised to call back or seek an in-person evaluation if the symptoms worsen or if the condition fails to improve as anticipated.  I provided 30 minutes of non-face-to-face time during this encounter.   Kathlee Nations, MD

## 2019-12-08 ENCOUNTER — Other Ambulatory Visit (HOSPITAL_COMMUNITY): Payer: Self-pay | Admitting: Psychiatry

## 2019-12-08 DIAGNOSIS — F250 Schizoaffective disorder, bipolar type: Secondary | ICD-10-CM

## 2019-12-09 ENCOUNTER — Other Ambulatory Visit (HOSPITAL_COMMUNITY): Payer: Self-pay | Admitting: *Deleted

## 2019-12-09 DIAGNOSIS — F250 Schizoaffective disorder, bipolar type: Secondary | ICD-10-CM

## 2019-12-10 ENCOUNTER — Other Ambulatory Visit: Payer: Self-pay

## 2019-12-10 ENCOUNTER — Ambulatory Visit (INDEPENDENT_AMBULATORY_CARE_PROVIDER_SITE_OTHER): Payer: Medicare Other

## 2019-12-10 DIAGNOSIS — F250 Schizoaffective disorder, bipolar type: Secondary | ICD-10-CM | POA: Diagnosis not present

## 2019-12-10 MED ORDER — PALIPERIDONE PALMITATE ER 234 MG/1.5ML IM SUSY
234.0000 mg | PREFILLED_SYRINGE | Freq: Once | INTRAMUSCULAR | Status: AC
  Administered 2019-12-10: 234 mg via INTRAMUSCULAR

## 2019-12-10 NOTE — Patient Instructions (Signed)
Patient presented with a flat affect for her Kirt Boys 234mg  injection today. She presented with a pleasant, cooperative attitude. She lives in [place] and asked if it would be possible for the CMA in [place] to give it to her from now on so that she wouldn't have to travel to [place] for the injection.

## 2019-12-18 ENCOUNTER — Telehealth: Payer: Medicare Other

## 2019-12-18 ENCOUNTER — Telehealth: Payer: Self-pay | Admitting: *Deleted

## 2019-12-18 NOTE — Telephone Encounter (Signed)
  Chronic Care Management   Outreach Note  12/18/2019 Name: [name] MRN: [id number] DOB: [birth year]  Referred by: Riesa Pope, MD Reason for referral : Chronic Care Management (HF, IDDM, HTN, COPD, Bipolar)   An unsuccessful telephone outreach was attempted today. The patient was referred to the case management team for assistance with care management and care coordination.   Follow Up Plan: A HIPPA compliant phone message was left for the patient providing contact information and requesting a return call.  The care management team will reach out to the patient again over the next 7-10 days.   Kelli Churn RN, CCM, [facility] RN Care Manager [phone]

## 2019-12-23 ENCOUNTER — Ambulatory Visit (INDEPENDENT_AMBULATORY_CARE_PROVIDER_SITE_OTHER): Payer: Medicare Other | Admitting: Psychiatry

## 2019-12-23 ENCOUNTER — Other Ambulatory Visit: Payer: Self-pay

## 2019-12-23 ENCOUNTER — Encounter (HOSPITAL_COMMUNITY): Payer: Self-pay | Admitting: Psychiatry

## 2019-12-23 DIAGNOSIS — F3110 Bipolar disorder, current episode manic without psychotic features, unspecified: Secondary | ICD-10-CM | POA: Diagnosis not present

## 2019-12-23 DIAGNOSIS — F250 Schizoaffective disorder, bipolar type: Secondary | ICD-10-CM

## 2019-12-23 NOTE — Progress Notes (Signed)
Virtual Visit via Video Note  I connected with [name] on 12/23/19 at  1:30 PM EDT by a video enabled telemedicine application and verified that I am speaking with the correct person using two identifiers.  Location: Patient: Patient Home Provider: Home Office  I discussed the limitations of evaluation and management by telemedicine and the availability of in person appointments. The patient expressed understanding and agreed to proceed.  History of Present Illness: Schizoeffective DO and Anxiety  Treatment Plan Goals: 1)The patient will work with the [place] therapist to reduce/eliminate symptoms of her Bipolar Disorder as measured by having fewer than 2 episodes per week, as evidenced by the patient report. 2) Client will work on improving self-esteem, through completing self-assessments/inventories, positive self talk and gratitude journal, as evidenced by patient report. 3) Client will work on "taking better care of myself," through taking medications as prescribed, eating 3 meals a day, attending doctors appointments, engaging in daily hygiene routines, as evidenced by patient report.  Observations/Objective: Counselor met with Client for individual therapy via Webex. Counselor assessed MH symptoms and progress on treatment plan goals, with patient reporting that she is currently feeling angry, disappointed and tired, due to conflict within relationships and trust issues. Client presents with moderate depression and moderate anxiety. Client denied suicidal ideation or self-harm behaviors. Client is maintaining at parents home since released from the hospital. Client notes not wanting to engage in treatment, that she is only participating because her parents and son want her to meet with counselor.   Client shared, in relation to goal 1, she is only having about one depressive episode per week, lasting a few hours at a time. Counselor assessed triggers and symptoms occurring during  the episode to problem solve coping strategies to prevent and better manage episodes. Client willing to implement coping skills.  Counselor assesses self-esteem issues and work in this area. Client reports no efforts toward completing assessments or journaling. Counselor and Client explore self-beliefs and faith beliefs, how they impact her overall self-esteem.   Client would like to meet once more in a month for monitoring and observation of recovery.   Assessment and Plan: Counselor will continue to meet with patient to address treatment plan goals. Patient will continue to follow recommendations of providers and implement skills learned in session.  Follow Up Instructions: Counselor will send information for next session via Webex.    The patient was advised to call back or seek an in-person evaluation if the symptoms worsen or if the condition fails to improve as anticipated.  I provided 55 minutes of non-face-to-face time during this encounter.   Lise Auer, LCSW

## 2019-12-28 ENCOUNTER — Telehealth: Payer: Self-pay | Admitting: *Deleted

## 2019-12-28 ENCOUNTER — Telehealth: Payer: Medicare Other

## 2019-12-28 NOTE — Telephone Encounter (Signed)
  Chronic Care Management   Outreach Note  12/28/2019 Name: [name] MRN: [id number] DOB: 10/20/1965  Referred by: Riesa Pope, MD Reason for referral : Chronic Care Management (HF, IDDM, HTN, COPD, Bipolar)   A second unsuccessful telephone outreach was attempted today. The patient was referred to the case management team for assistance with care management and care coordination.   Follow Up Plan: A HIPAA compliant phone message was left for the patient providing contact information and requesting a return call.  The care management team will reach out to the patient again over the next 7-10 days.   Kelli Churn RN, CCM, [facility] RN Care Manager [phone]

## 2019-12-29 ENCOUNTER — Other Ambulatory Visit: Payer: Self-pay | Admitting: Student

## 2019-12-29 MED ORDER — AMLODIPINE BESYLATE 10 MG PO TABS: 10.0000 mg | ORAL_TABLET | Freq: Every day | ORAL | 3 refills | Status: DC

## 2019-12-29 NOTE — Telephone Encounter (Signed)
Rx filled for amlodipine.

## 2019-12-29 NOTE — Telephone Encounter (Signed)
Pls contact pt [phone] regarding bp medicine

## 2019-12-30 ENCOUNTER — Other Ambulatory Visit (HOSPITAL_COMMUNITY): Payer: Self-pay

## 2019-12-30 ENCOUNTER — Telehealth (INDEPENDENT_AMBULATORY_CARE_PROVIDER_SITE_OTHER): Payer: Medicare Other | Admitting: Psychiatry

## 2019-12-30 ENCOUNTER — Encounter (HOSPITAL_COMMUNITY): Payer: Self-pay | Admitting: Psychiatry

## 2019-12-30 ENCOUNTER — Other Ambulatory Visit: Payer: Self-pay

## 2019-12-30 DIAGNOSIS — F250 Schizoaffective disorder, bipolar type: Secondary | ICD-10-CM | POA: Diagnosis not present

## 2019-12-30 DIAGNOSIS — F419 Anxiety disorder, unspecified: Secondary | ICD-10-CM

## 2019-12-30 MED ORDER — TRAZODONE HCL 100 MG PO TABS: 100.0000 mg | ORAL_TABLET | Freq: Every day | ORAL | 1 refills | Status: DC

## 2019-12-30 MED ORDER — PALIPERIDONE ER 3 MG PO TB24: 3.0000 mg | ORAL_TABLET | Freq: Every day | ORAL | 1 refills | Status: DC

## 2019-12-30 MED ORDER — INVEGA SUSTENNA 234 MG/1.5ML IM SUSY: 234.0000 mg | PREFILLED_SYRINGE | Freq: Once | INTRAMUSCULAR | 1 refills | Status: DC

## 2019-12-30 MED ORDER — SERTRALINE HCL 100 MG PO TABS: 100.0000 mg | ORAL_TABLET | Freq: Every day | ORAL | 1 refills | Status: DC

## 2019-12-30 NOTE — Progress Notes (Signed)
Virtual Visit via Telephone Note  I connected with [name] on 12/30/19 at  1:00 PM EDT by telephone and verified that I am speaking with the correct person using two identifiers.  Location: Patient: home Provider: home office   I discussed the limitations, risks, security and privacy concerns of performing an evaluation and management service by telephone and the availability of in person appointments. I also discussed with the patient that there may be a patient responsible charge related to this service. The patient expressed understanding and agreed to proceed.   History of Present Illness: Patient is evaluated by phone session.  She does not want to talk while mother is present but mother pick up the phone and she mention patient is struggling with sleep and she is giving trazodone 75 mg which helped 4 to 5 hours sleep.  Patient remains guarded about her symptoms.  She does not believe she has mental illness but taking the medication as her mother now manages the medication.  She appears at times irritable but question asked about paranoia or hallucination.  She denies any suicidal thoughts.  She denies any side effects of medication.  She received her last injection of Invega on August 26.  She is also taking by mouth.  She is not happy taking medication but so far has not stopped.  Patient has history of noncompliance with medication.  Past Psychiatric History: H/Oschizoaffective d/owith at least 10inpatient and multiple suicidal attempt. H/O jumping from running car, OD on bleach andhypertensive medication. [facility] Geodon helped. Took Depakote, Trazodone, perphenazine,temazepam, mirtazapine. Last inpatient switched to [facility].     Psychiatric Specialty Exam: Physical Exam  Review of Systems  There were no vitals taken for this visit.There is no height or weight on file to calculate BMI.  General Appearance:  Guarded and superfical copperative  Eye Contact:  NA  Speech:  Slow  Volume:  Decreased  Mood:  Irritable  Affect:  NA  Thought Process:  Descriptions of Associations: Intact  Orientation:  Full (Time, Place, and Person)  Thought Content:  Paranoid Ideation  Suicidal Thoughts:  No  Homicidal Thoughts:  No  Memory:  Immediate;   Fair Recent;   Fair Remote;   Fair  Judgement:  Fair  Insight:  Shallow  Psychomotor Activity:  NA  Concentration:  Concentration: Fair and Attention Span: Fair  Recall:  [employer] of Knowledge:  Fair  Language:  Good  Akathisia:  No  Handed:  Right  AIMS (if indicated):     Assets:  Desire for Improvement Housing Social Support  ADL's:  Intact  Cognition:  Impaired,  Mild  Sleep:   4-6hrs    Assessment and Plan: Schizoaffective disorder, bipolar type.  Anxiety.  Patient is still have residual symptoms of paranoia and her insight remains limited about her illness.  I recommend to try trazodone 100 mg to help her sleep and patient reported that she will try.  Continue Invega 234 mg intramuscular every month and we will keep Invega 3 mg by mouth for now.  Continue sertraline 100 mg at bedtime.  Encouraged to continue therapy with [facility].  Recommended to call us back if she has any question or any concern.  Her next appointment for Lorayne Bender will be September 26.  Follow-up in 2 months.  Follow Up Instructions:    I discussed the assessment and treatment plan with the patient. The patient was provided an opportunity to ask questions  and all were answered. The patient agreed with the plan and demonstrated an understanding of the instructions.   The patient was advised to call back or seek an in-person evaluation if the symptoms worsen or if the condition fails to improve as anticipated.  I provided 24 minutes of non-face-to-face time during this encounter.   Kathlee Nations, MD

## 2020-01-04 ENCOUNTER — Telehealth: Payer: Self-pay | Admitting: *Deleted

## 2020-01-04 ENCOUNTER — Telehealth: Payer: Medicare Other

## 2020-01-04 NOTE — Telephone Encounter (Signed)
  Chronic Care Management   Outreach Note  01/04/2020 Name: [name] MRN: [id number] DOB: 07-Dec-1965  Referred by: Riesa Pope, MD Reason for referral : Chronic Care Management (HF, IDDM, HTN, COPD, HLD, Bipolar)   Third unsuccessful telephone outreach was attempted today. The patient was referred to the case management team for assistance with care management and care coordination.   Follow Up Plan: A HIPAA compliant phone message was left for the patient providing contact information and requesting a return call.  If no return call from patient, the care management team will make a final outreach to the patient over the next 7-10 days.   Kelli Churn RN, CCM, [facility] RN Care Manager [phone]

## 2020-01-07 ENCOUNTER — Ambulatory Visit (HOSPITAL_COMMUNITY): Payer: Medicare Other

## 2020-01-11 ENCOUNTER — Ambulatory Visit: Payer: Self-pay | Admitting: *Deleted

## 2020-01-11 ENCOUNTER — Other Ambulatory Visit: Payer: Self-pay

## 2020-01-11 ENCOUNTER — Telehealth: Payer: Self-pay | Admitting: *Deleted

## 2020-01-11 ENCOUNTER — Ambulatory Visit (INDEPENDENT_AMBULATORY_CARE_PROVIDER_SITE_OTHER): Payer: Medicare Other | Admitting: *Deleted

## 2020-01-11 ENCOUNTER — Telehealth: Payer: Medicare Other

## 2020-01-11 VITALS — BP 129/89 | HR 76 | Temp 98.5°F | Ht 64.5 in | Wt 168.2 lb

## 2020-01-11 DIAGNOSIS — F250 Schizoaffective disorder, bipolar type: Secondary | ICD-10-CM | POA: Diagnosis not present

## 2020-01-11 DIAGNOSIS — I10 Essential (primary) hypertension: Secondary | ICD-10-CM

## 2020-01-11 DIAGNOSIS — J449 Chronic obstructive pulmonary disease, unspecified: Secondary | ICD-10-CM

## 2020-01-11 DIAGNOSIS — E119 Type 2 diabetes mellitus without complications: Secondary | ICD-10-CM

## 2020-01-11 DIAGNOSIS — I5032 Chronic diastolic (congestive) heart failure: Secondary | ICD-10-CM

## 2020-01-11 MED ORDER — PALIPERIDONE PALMITATE ER 234 MG/1.5ML IM SUSY
234.0000 mg | PREFILLED_SYRINGE | Freq: Once | INTRAMUSCULAR | Status: AC
  Administered 2020-01-11: 234 mg via INTRAMUSCULAR

## 2020-01-11 NOTE — Chronic Care Management (AMB) (Signed)
Chronic Care Management   Follow Up Note   01/11/2020 Name: [name] MRN: [id number] DOB: 04-Jan-1966  Referred by: Riesa Pope, MD Reason for referral : Chronic Care Management (HF, IDDM, HTN, COPD, HLD, Bipolar)   [name] is a 54 y.o. year old female who is a primary care patient of [name], [name], MD. The CCM team was consulted for assistance with chronic disease management and care coordination needs.    Review of patient status, including review of consultants reports, relevant laboratory and other test results, and collaboration with appropriate care team members and the patient's provider was performed as part of comprehensive patient evaluation and provision of chronic care management services.    SDOH (Social Determinants of Health) assessments performed: No See Care Plan activities for detailed interventions related to [facility])     Outpatient Encounter Medications as of 01/11/2020  Medication Sig Note  . Accu-Chek Softclix Lancets lancets Use as instructed   . albuterol (VENTOLIN HFA) 108 (90 Base) MCG/ACT inhaler Inhale 1-2 puffs into the lungs every 6 (six) hours as needed for wheezing or shortness of breath. 09/11/2019: Uses rarely   . amLODipine (NORVASC) 10 MG tablet Take 1 tablet (10 mg total) by mouth daily. For high blood pressure   . ferrous sulfate 325 (65 FE) MG tablet Take 1 tablet (325 mg total) by mouth every other day. (May buy from over the counter): For anemia   . insulin glargine (LANTUS) 100 UNIT/ML injection Inject 0.14 mLs (14 Units total) into the skin daily. For diabetes management   . losartan (COZAAR) 50 MG tablet Take 1 tablet (50 mg total) by mouth daily.   Marland Kitchen lovastatin (MEVACOR) 40 MG tablet Take 1 tablet (40 mg total) by mouth every evening.   . metFORMIN (GLUCOPHAGE) 1000 MG tablet Take 1 tablet (1,000 mg total) by mouth 2 (two) times daily with a meal.   . metoprolol tartrate (LOPRESSOR) 100 MG tablet Take 1 tablet  (100 mg total) by mouth 2 (two) times daily.   . nicotine (NICODERM CQ - DOSED IN MG/24 HR) 7 mg/24hr patch Place 1 patch (7 mg total) onto the skin daily. (Patient not taking: Reported on 10/10/2019)   . paliperidone (INVEGA SUSTENNA) 234 MG/1.5ML SUSY injection Inject 234 mg into the muscle once for 1 dose.   . paliperidone (INVEGA) 3 MG 24 hr tablet Take 1 tablet (3 mg total) by mouth daily.   Marland Kitchen senna-docusate (SENOKOT-S) 8.6-50 MG tablet Take 1 tablet by mouth at bedtime as needed. (May buy from over the counter): For constipation   . sertraline (ZOLOFT) 100 MG tablet Take 1 tablet (100 mg total) by mouth daily. For depression   . spironolactone (ALDACTONE) 50 MG tablet Take 1 tablet (50 mg total) by mouth daily.   . Thiamine HCl (VITAMIN B-1) 100 MG TABS Take 1 tablet (100 mg total) by mouth daily.   . traZODone (DESYREL) 100 MG tablet Take 1 tablet (100 mg total) by mouth at bedtime.    No facility-administered encounter medications on file as of 01/11/2020.     Objective:  Wt Readings from Last 3 Encounters:  10/09/19 156 lb 8.4 oz (71 kg)  08/19/19 157 lb 12.8 oz (71.6 kg)  06/18/19 171 lb 8.3 oz (77.8 kg)    Goals Addressed              This Visit's Progress     Patient Stated   .  COMPLETED: 'I'm feeling better. I check my  blood pressure 2-3 times a day but I haven't  checked my blood sugar since I left the hospital" (pt-stated)        [place] (see longitudinal plan of care for additional care plan information)  Current Barriers:  . Chronic Disease Management support, education, and care coordination needs related to HTN, HLD, COPD, DMII, Bipolar Disorder, and Schizoaffective Disorder - 4th message left for patient without return call, have not been able to contact patient despite multiple attempts by the chronic care management team  Clinical Goal(s) related to HTN, HLD, COPD, DMII, Bipolar Disorder, and Schizoaffective Disorder Over the next 30 -60 days, patient  will:  . Work with the care management team to address educational, disease management, and care coordination needs  . Begin or continue self health monitoring activities as directed today Measure and record CBG (blood glucose) 1 times daily, Measure and record blood pressure 1-2 times per week . Call provider office for new or worsened signs and symptoms Blood glucose findings outside established parameters, Blood pressure findings outside established parameters, Shortness of breath, and New or worsened symptom related to HTN, HLD, COPD, DMII, Bipolar Disorder, and Schizoaffective Disorder . Call care management team with questions or concerns . Verbalize basic understanding of patient centered plan of care established today  Interventions related to HTN, HLD, COPD, DMII, Bipolar Disorder, and Schizoaffective Disorder   . 01/11/20- Unable to pursue further intervention for this care plan activity secondary to inability to maintain contact with the patient  Patient Self Care Activities related to HTN, HLD, COPD, DMII, Bipolar Disorder, and Schizoaffective Disorder:  . Patient is unable to independently self-manage chronic health conditions  Please see past updates related to this goal by clicking on the "Past Updates" button in the selected goal        Other   .  COMPLETED: Blood Pressure < 140/90        BP Readings from Last 3 Encounters:  08/19/19 120/79  07/15/19 116/86  06/18/19 (!) 143/76    Currently meeting BP targets  01/11/20 Unable to pursue further intervention for this care plan activity secondary to inability to maintain contact with the patient    .  COMPLETED: HEMOGLOBIN A1C < 7        Lab Results  Component Value Date   HGBA1C 7.2 (A) 08/19/2019    Not meeting target A1C  01/11/20 Unable to pursue further intervention for this care plan activity secondary to inability to maintain contact with the patient    .  COMPLETED: LDL CALC < 100        Lab Results  Component  Value Date   CHOL 86 06/05/2019   HDL 28 (L) 06/05/2019   LDLCALC 34 06/05/2019   TRIG 118 06/05/2019   CHOLHDL 3.1 06/05/2019  Meeting LDL target  01/11/20- Unable to pursue further intervention for this care plan activity secondary to inability to maintain contact with the patient    .  COMPLETED: Weight < 195 lb (88.5 kg)        Wt Readings from Last 3 Encounters:  08/19/19 157 lb 12.8 oz (71.6 kg)  06/18/19 171 lb 8.3 oz (77.8 kg)  06/17/19 171 lb 9.6 oz (77.8 kg)   Meeting weight target  01/11/20 Unable to pursue further intervention for this care plan activity secondary to inability to maintain contact with the patient        Plan:   We have been unable to maintain contact with the  patient for follow up. The care management team is available to follow up with the patient after provider conversation with the patient regarding recommendation for care management engagement and subsequent re-referral to the care management team.    Kelli Churn RN, CCM, [facility] RN Care Manager [phone]

## 2020-01-11 NOTE — Progress Notes (Addendum)
Internal Medicine Clinic Resident  I have personally reviewed this encounter including the documentation in this note and/or discussed this patient with the care management provider. I will address any urgent items identified by the care management provider and will communicate my actions to the patient's PCP. I have reviewed the patient's CCM visit with my supervising attending, Dr Jimmye Norman.  Jean Rosenthal, MD 01/11/2020   Internal Medicine Attending: I reviewed case and documentation and agree.

## 2020-01-11 NOTE — Progress Notes (Signed)
Patient given Kirt Boys injection in eft arm.  Patient toledrated injection well. Patient to return in 4 weeks.  Patient in voiced no complaints. Vitals good.

## 2020-01-11 NOTE — Telephone Encounter (Signed)
°  Chronic Care Management   Outreach Note  01/11/2020 Name: [name] MRN: [id number] DOB: 04-Jan-1966  Referred by: Riesa Pope, MD Reason for referral : Chronic Care Management (HF, IDDM, HTN, COPD, HLD, Bipolar)   Fourth unsuccessful telephone outreach was attempted today. The patient was referred to the case management team for assistance with care management and care coordination. The patient's primary care provider has been notified of our unsuccessful attempts to make or maintain contact with the patient. The care management team is pleased to engage with this patient at any time in the future should he/she be interested in assistance from the care management team.   Follow Up Plan: We have been unable to maintain contact with the patient for follow up. The care management team is available to follow up with the patient after provider conversation with the patient regarding recommendation for care management engagement and subsequent re-referral to the care management team.   Kelli Churn RN, CCM, [facility] RN Care Manager [phone]

## 2020-01-13 ENCOUNTER — Telehealth (HOSPITAL_COMMUNITY): Payer: Self-pay | Admitting: *Deleted

## 2020-01-13 NOTE — Telephone Encounter (Signed)
Patients mother called to report that patient had stopped eating again because she is paranoid. FYI

## 2020-01-13 NOTE — Telephone Encounter (Signed)
[name]

## 2020-01-13 NOTE — Telephone Encounter (Signed)
She needs therapy. Please schedule with therapist.

## 2020-01-14 ENCOUNTER — Ambulatory Visit (HOSPITAL_COMMUNITY): Payer: Medicare Other | Admitting: Clinical

## 2020-01-14 ENCOUNTER — Other Ambulatory Visit: Payer: Self-pay

## 2020-01-14 DIAGNOSIS — F319 Bipolar disorder, unspecified: Secondary | ICD-10-CM

## 2020-01-14 NOTE — Progress Notes (Signed)
Virtual Visit via Video Note  I connected withMonique R. [name] on 09/30/21at1:00PM EDTby a video enabled telemedicine application and verified that I am speaking with the correct person using two identifiers.  Location: Patient:Home Provider:Office  I discussed the limitations of evaluation and management by telemedicine and the availability of in person appointments. The patient expressed understanding and agreed to proceed.         THERAPIST PROGRESS NOTE  Session Time:1:00PM-1:30PM  Participation Level:Active  Behavioral Response:CasualAlertDepressed  Type of Therapy:Individual Therapy  Treatment Goals addressed:Coping  Interventions:CBT, DBT, Motivational Interviewing, Solution Focused, Strength-based and Supportive  Summary:Rachel R. Barnettis a 54 y.o.femalewho presents with Bipolar DisorderThe OPT therapist worked with thepatientfor her scheduled treatment session. The OPT therapist utilized Motivational Interviewing to assist in creating therapeutic repore. The patient in the session was engaged and work in Science writer about hertriggers and symptoms over the past few weeks.. The OPT therapist utilized Cognitive Behavioral Therapy through cognitive restructuring as well as worked with the patient reviewing her coping. The patient verbalized strong reliance on her faith and praying.The OPT therapist reviewed with the patient her recent appointments with health providers.   Suicidal/Homicidal:Nowithout intent/plan  Therapist Response:The OPT therapist worked with the patient for the patients scheduled session. The patient was engaged in his session and gave feedback in relation to triggers, symptoms, and behavior responses over the pastfewweeks. The OPT therapist worked with the patient utilizing an in session Cognitive Behavioral Therapy exercise. The patient was responsive in the session and verbalized,  " [name] it to [place] and the Father and they guide me". The OPT therapistworked with the patient reviewing any upcoming changes or transitions. The patient notes looking forward to her birthday tomorrow and reviewed her plan to celebrate. The patient identified currently a goal of trying to stop smoking/cessation. The OPT therapistwill continue treatment work with the patient in hernext scheduled session  Plan: Return again in2/3weeks.  Diagnosis:Axis I:Bi-polar disorder Axis II:No diagnosis  I discussed the assessment and treatment plan with the patient. The patient was provided an opportunity to ask questions and all were answered. The patient agreed with the plan and demonstrated an understanding of the instructions.  The patient was advised to call back or seek an in-person evaluation if the symptoms worsen or if the condition fails to improve as anticipated.  I provided62minutes of non-face-to-face time during this encounter.   Lennox Grumbles, LCSW 01/14/2020

## 2020-01-20 ENCOUNTER — Ambulatory Visit: Payer: Self-pay

## 2020-01-20 NOTE — Chronic Care Management (AMB) (Signed)
CCM status changed to previously enrolled due to inability to maintain contact.     Ronn Melena, [place] Coordination Social Worker [place] [phone]

## 2020-01-21 ENCOUNTER — Other Ambulatory Visit (HOSPITAL_COMMUNITY): Payer: Self-pay | Admitting: Psychiatry

## 2020-01-21 DIAGNOSIS — F419 Anxiety disorder, unspecified: Secondary | ICD-10-CM

## 2020-01-28 ENCOUNTER — Other Ambulatory Visit: Payer: Self-pay

## 2020-01-28 ENCOUNTER — Ambulatory Visit (INDEPENDENT_AMBULATORY_CARE_PROVIDER_SITE_OTHER): Payer: Medicare Other | Admitting: Psychiatry

## 2020-01-28 ENCOUNTER — Encounter (HOSPITAL_COMMUNITY): Payer: Self-pay | Admitting: Psychiatry

## 2020-01-28 DIAGNOSIS — F319 Bipolar disorder, unspecified: Secondary | ICD-10-CM

## 2020-01-28 NOTE — Progress Notes (Signed)
Virtual Visit via Telephone Note  I connected with [name] on 01/28/20 at  2:30 PM EDT by telephone and verified that I am speaking with the correct person using two identifiers.  Location: Patient: Patient Home Provider: Home Office  I discussed the limitations of evaluation and management by telemedicine and the availability of in person appointments. The patient expressed understanding and agreed to proceed.  History of Present Illness: Bipolar 1 DO  Treatment Plan Goals: 1)The patient will work with the [place] therapist to reduce/eliminate symptoms of her Bipolar Disorder as measured by having fewer than 2 episodes per week, as evidenced by the patient report. 2) Client will work on improving self-esteem, through completing self-assessments/inventories, positive self talk and gratitude journal, as evidenced by patient report. 3) Client will work on "taking better care of myself," through taking medications as prescribed, eating 3 meals a day, attending doctors appointments, engaging in daily hygiene routines, as evidenced by patient report.  Observations/Objective: Counselor met with Client for individual therapy via Webex. Counselor assessed MH symptoms and progress on treatment plan goals, with patient reporting that she is currently visiting family out of town and is "in good spirits" due to meaningful interactions. Client requested to have a shorter session today to be able to spend more time with family. Counselor granted request. Client presents with mild depression and mild anxiety. Client denied suicidal ideation or self-harm behaviors.   Client reports overall wellbeing as going well. Counselor assessed daily functioning, hyigene care, medication management, meeting basic needs, social connections and self-care. Client endorsed taking medications as prescribed and taking care of daily needs. Counselor assessed application of skills learned in past sessions. Client noted  that she is making and restating positive affirmations, gratide statements, praying and reading scripture to combat depressive symptoms. Client reports positive effect of application of skills. Client notes making meaningful interactions with family members and resting when possible to keep energy up. Client to set next appointment by calling admin staff upon return from vacation.   Assessment and Plan: Counselor will continue to meet with patient to address treatment plan goals. Patient will continue to follow recommendations of providers and implement skills learned in session.  Follow Up Instructions: Counselor will send information for next session via Webex.   The patient was advised to call back or seek an in-person evaluation if the symptoms worsen or if the condition fails to improve as anticipated.  I provided 30 minutes of non-face-to-face time during this encounter.   Lise Auer, LCSW

## 2020-02-09 ENCOUNTER — Ambulatory Visit (INDEPENDENT_AMBULATORY_CARE_PROVIDER_SITE_OTHER): Payer: Medicare Other | Admitting: *Deleted

## 2020-02-09 ENCOUNTER — Other Ambulatory Visit: Payer: Self-pay

## 2020-02-09 VITALS — BP 104/71 | HR 76 | Temp 97.6°F | Ht 64.5 in | Wt 167.2 lb

## 2020-02-09 DIAGNOSIS — F250 Schizoaffective disorder, bipolar type: Secondary | ICD-10-CM

## 2020-02-09 MED ORDER — PALIPERIDONE PALMITATE ER 234 MG/1.5ML IM SUSY
234.0000 mg | PREFILLED_SYRINGE | INTRAMUSCULAR | Status: DC
  Administered 2020-02-09 – 2020-04-06 (×3): 234 mg via INTRAMUSCULAR

## 2020-02-09 NOTE — Patient Instructions (Signed)
Follow-up in 4 weeks

## 2020-02-09 NOTE — Progress Notes (Signed)
Patient came in and was given Invega injection Sustenna injection in right arm. Patient toledrated injection well. Patient to return in 4 weeks. Patient voiced no complaints.

## 2020-02-16 ENCOUNTER — Ambulatory Visit (INDEPENDENT_AMBULATORY_CARE_PROVIDER_SITE_OTHER): Payer: Medicare Other | Admitting: Student

## 2020-02-16 ENCOUNTER — Other Ambulatory Visit: Payer: Self-pay

## 2020-02-16 ENCOUNTER — Other Ambulatory Visit: Payer: Self-pay | Admitting: Student

## 2020-02-16 VITALS — BP 109/66 | HR 73 | Temp 98.4°F | Ht 64.0 in | Wt 166.7 lb

## 2020-02-16 DIAGNOSIS — I1A0 Resistant hypertension: Secondary | ICD-10-CM

## 2020-02-16 DIAGNOSIS — E119 Type 2 diabetes mellitus without complications: Secondary | ICD-10-CM | POA: Diagnosis not present

## 2020-02-16 DIAGNOSIS — I10 Essential (primary) hypertension: Secondary | ICD-10-CM | POA: Diagnosis not present

## 2020-02-16 DIAGNOSIS — R5383 Other fatigue: Secondary | ICD-10-CM

## 2020-02-16 DIAGNOSIS — Z0000 Encounter for general adult medical examination without abnormal findings: Secondary | ICD-10-CM | POA: Diagnosis not present

## 2020-02-16 DIAGNOSIS — Z794 Long term (current) use of insulin: Secondary | ICD-10-CM

## 2020-02-16 LAB — POCT GLYCOSYLATED HEMOGLOBIN (HGB A1C): Hemoglobin A1C: 7.5 % — AB (ref 4.0–5.6)

## 2020-02-16 LAB — GLUCOSE, CAPILLARY: Glucose-Capillary: 197 mg/dL — ABNORMAL HIGH (ref 70–99)

## 2020-02-16 MED ORDER — METFORMIN HCL ER 500 MG PO TB24: 1000.0000 mg | ORAL_TABLET | Freq: Every morning | ORAL | Status: DC

## 2020-02-16 MED ORDER — METOPROLOL SUCCINATE ER 100 MG PO TB24: 100.0000 mg | ORAL_TABLET | Freq: Every day | ORAL | 11 refills | Status: DC

## 2020-02-16 MED ORDER — AMLODIPINE-OLMESARTAN 5-20 MG PO TABS: 1.0000 | ORAL_TABLET | Freq: Every day | ORAL | 1 refills | Status: DC

## 2020-02-16 NOTE — Assessment & Plan Note (Signed)
Assessment: Patient with history of hypertension well controlled on amlodipne 10 mg, losartan 50 mg, and metoprolol 100 mg BID. Patient endorses low blood pressures measured at home and in clinic. BP: 109/66 today in clinic. Patient requesting to decrease medications, she is also uncertain as to what she is actually taking.   Plan: Discontinued amlodipine, losartan, and metoprolol. Started amlodipine olmesartan 5-20 mg Started Metoprolol succinate 100 mg 24 hr tab Reassess in 3 weeks

## 2020-02-16 NOTE — Progress Notes (Signed)
CC: Low sugars  HPI:  [name] is a 54 y.o. female with a past medical history stated below and presents today for low sugars and wanting to decrease the amount of medications she is taking. Please see problem based assessment and plan for additional details.  Past Medical History:  Diagnosis Date  . Alcohol abuse   . Anemia, iron deficiency   . Arteriosclerotic cardiovascular disease (ASCVD)    Minimal at cath in [facility]stress nuclear study in 8/08 with nl EF; neg stress echo in 2010  . Community acquired pneumonia 01/03/10, 05/2010, 04/2012   2011; with pleural effusion-hosp [facility] acute resp failure; intubated in Jan 2014 (HMPV pneumonia)  . Depression   . Diabetes mellitus, type 2 ([place]) 2000   Onset in 2000; no insulin  . Diarrhea 10/14/2013   Started 10/10/13, improved with Imodium.   . Diastolic dysfunction    grade 2 per echo 2011  . Dysphagia   . Gastroesophageal reflux disease    Schatzki's ring  . History of alcohol abuse 07/22/2007   Qualifier: Diagnosis of  By: Lenn Cal    . Hyperlipidemia   . Hypertension `   during treatment with Geodon  . Hypokalemia 12/25/2013  . Left knee pain 08/25/2014  . Obesity   . Oral candidiasis 05/16/2017  . PTSD (post-traumatic stress disorder)   . Pulmonary hypertension ([place]) 05/02/2012   Patient needs repeat echo in 06/2012   . Schizoaffective disorder    requiring multiple psychiatric admissions  . Viral URI 05/12/2013  . Viral wart on finger 10/08/2016  . Vision changes 08/12/2014    Current Outpatient Medications on File Prior to Visit  Medication Sig Dispense Refill  . Accu-Chek Softclix Lancets lancets Use as instructed 400 each 12  . albuterol (VENTOLIN HFA) 108 (90 Base) MCG/ACT inhaler Inhale 1-2 puffs into the lungs every 6 (six) hours as needed for wheezing or shortness of breath. 18 g 5  . amLODipine (NORVASC) 10 MG tablet Take 1 tablet (10 mg total) by mouth daily. For high blood pressure 90  tablet 3  . ferrous sulfate 325 (65 FE) MG tablet Take 1 tablet (325 mg total) by mouth every other day. (May buy from over the counter): For anemia 15 tablet 2  . insulin glargine (LANTUS) 100 UNIT/ML injection Inject 0.14 mLs (14 Units total) into the skin daily. For diabetes management 10 mL 0  . losartan (COZAAR) 50 MG tablet Take 1 tablet (50 mg total) by mouth daily. 90 tablet 1  . lovastatin (MEVACOR) 40 MG tablet Take 1 tablet (40 mg total) by mouth every evening. 90 tablet 0  . metFORMIN (GLUCOPHAGE) 1000 MG tablet Take 1 tablet (1,000 mg total) by mouth 2 (two) times daily with a meal. 180 tablet 0  . metoprolol tartrate (LOPRESSOR) 100 MG tablet Take 1 tablet (100 mg total) by mouth 2 (two) times daily. 180 tablet 0  . nicotine (NICODERM CQ - DOSED IN MG/24 HR) 7 mg/24hr patch Place 1 patch (7 mg total) onto the skin daily. (Patient not taking: Reported on 10/10/2019) 21 patch 0  . paliperidone (INVEGA SUSTENNA) 234 MG/1.5ML SUSY injection Inject 234 mg into the muscle once for 1 dose. 1.5 mL 1  . paliperidone (INVEGA) 3 MG 24 hr tablet Take 1 tablet (3 mg total) by mouth daily. 30 tablet 1  . senna-docusate (SENOKOT-S) 8.6-50 MG tablet Take 1 tablet by mouth at bedtime as needed. (May buy from over the counter): For  constipation 60 tablet 1  . sertraline (ZOLOFT) 100 MG tablet Take 1 tablet (100 mg total) by mouth daily. For depression 30 tablet 1  . spironolactone (ALDACTONE) 50 MG tablet Take 1 tablet (50 mg total) by mouth daily. 90 tablet 3  . Thiamine HCl (VITAMIN B-1) 100 MG TABS Take 1 tablet (100 mg total) by mouth daily. 90 tablet 1  . traZODone (DESYREL) 100 MG tablet Take 1 tablet (100 mg total) by mouth at bedtime. 30 tablet 1   Current Facility-Administered Medications on File Prior to Visit  Medication Dose Route Frequency Provider Last Rate Last Admin  . paliperidone (INVEGA SUSTENNA) injection 234 mg  234 mg Intramuscular Q28 days Arfeen, Arlyce Harman, MD   234 mg at 02/09/20  1028    Family History  Problem Relation Age of Onset  . Hypertension Mother   . Stroke Father        deceased at age 80  . Colon cancer Other   . Heart disease Sister   . Diabetes Other   . High Cholesterol Other   . Arthritis Other   . Anesthesia problems Neg Hx   . Hypotension Neg Hx   . Malignant hyperthermia Neg Hx   . Pseudochol deficiency Neg Hx     Social History   Socioeconomic History  . Marital status: Single    Spouse name: Not on file  . Number of children: 2  . Years of education: some colge  . Highest education level: Not on file  Occupational History  . Occupation: Disability  . Occupation: UNEMPLOYED    Employer: UNEMPLOYED  Tobacco Use  . Smoking status: Current Some Day Smoker    Packs/day: 1.00    Years: 30.00    Pack years: 30.00    Types: Cigarettes    Start date: 05/18/2012  . Smokeless tobacco: Never Used  . Tobacco comment: 1 PPD  Vaping Use  . Vaping Use: Never used  Substance and Sexual Activity  . Alcohol use: No    Alcohol/week: 0.0 standard drinks    Comment: hx of ETOH abuse  . Drug use: No  . Sexual activity: Not Currently    Partners: Male    Birth control/protection: None  Other Topics Concern  . 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 Difficulty of Paying Living Expenses: Not on file  Food Insecurity:   . Worried About Charity fundraiser in the Last Year: Not on file  . Ran Out of Food in the Last Year: Not on file  Transportation Needs: No Transportation Needs  . Lack of Transportation (Medical): No  . Lack of Transportation (Non-Medical): No  Physical Activity:   . Days of Exercise per  Week: Not on file  . Minutes of Exercise per Session: Not on file  Stress:   . Feeling of Stress : Not on file  Social Connections: Unknown  . Frequency of Communication with Friends and Family: More than three times a week  . Frequency of Social Gatherings with Friends and Family: Three times a week  . Attends Religious Services: Not on file  . Active Member of Clubs  or Organizations: Not on file  . Attends Archivist Meetings: Not on file  . Marital Status: Not on file  Intimate Partner Violence:   . Fear of Current or Ex-Partner: Not on file  . Emotionally Abused: Not on file  . Physically Abused: Not on file  . Sexually Abused: Not on file    Review of Systems: ROS negative except for what is noted on the assessment and plan.  There were no vitals filed for this visit.   Physical Exam: Physical Exam Vitals and nursing note reviewed.  Constitutional:      General: She is not in acute distress.    Appearance: Normal appearance. She is not ill-appearing, toxic-appearing or diaphoretic.  Cardiovascular:     Rate and Rhythm: Normal rate and regular rhythm.     Pulses: Normal pulses.     Heart sounds: Normal heart sounds. No murmur heard.  No friction rub. No gallop.   Pulmonary:     Effort: Pulmonary effort is normal. No respiratory distress.     Breath sounds: Normal breath sounds. No stridor. No wheezing, rhonchi or rales.  Musculoskeletal:     Right lower leg: No edema.     Left lower leg: No edema.  Skin:    General: Skin is warm and dry.     Comments: Callus on medial aspect of left foot and on bottom of right foot  Neurological:     General: No focal deficit present.     Mental Status: She is alert and oriented to person, place, and time. Mental status is at baseline.  Psychiatric:        Mood and Affect: Mood normal.        Behavior: Behavior normal.        Thought Content: Thought content normal.        Judgment: Judgment normal.      Assessment  & Plan:   See Encounters Tab for problem based charting.  Patient seen with Dr. Delano Metz, D.O. [facility], PGY-2 Pager: [phone], Phone: [phone] Date 02/16/2020 Time 3:16 PM

## 2020-02-16 NOTE — Assessment & Plan Note (Addendum)
Assessment: Lab Results  Component Value Date   HGBA1C 7.5 (A) 02/16/2020   A1c increased from prior visit. Patient notes she is working on decreasing her soda intake from three times a day to once daily. I congratulated her on this. She also endorses episodes where she feels shaky and she'll eat something with high carbohydrates to increase her sugar. During these episodes where she feels as though her sugar is low, she does not check her sugars. She also endorses not taking her metformin or insulin as she is worried of her sugars dropping too low as some days she only eats one meal a day. She declined wanting to talk with [name] today about a continuous glucose monitor.   Patient also with calsus on foot. Recommend patient follow up with [facility], patient has seen in the past and states she will call to schedule an appointment.   Plan: Change metformin to 1000mg  daily 24 hr tab Continue lantus 14 units daily Patient to call [facility] Reassess in 3 weeks

## 2020-02-16 NOTE — Patient Instructions (Addendum)
Thank you, [name] for allowing [name] to provide your care today. Today we discussed your diabetes and high blood pressure. We have changed your medications, please stop taking your amlodipine, losartan, metoprolol, and metformin. We put you on a new metformin, metoprolol and a combination amlodipine/olmesartan. If you have any questions please call our office.   I have ordered the following labs for you:   Lab Orders     Glucose, capillary     POC Hbg A1C    Referrals ordered today:   Referral Orders  No referral(s) requested today     I have ordered the following medication/changed the following medications:   Stop the following medications: Medications Discontinued During This Encounter  Medication Reason  . metFORMIN (GLUCOPHAGE) 1000 MG tablet Change in therapy  . metoprolol tartrate (LOPRESSOR) 100 MG tablet Change in therapy  . losartan (COZAAR) 50 MG tablet Change in therapy  . amLODipine (NORVASC) 10 MG tablet Change in therapy     Start the following medications: Meds ordered this encounter  Medications  . metFORMIN (GLUCOPHAGE-XR) 24 hr tablet 1,000 mg  . metoprolol succinate (TOPROL XL) 100 MG 24 hr tablet    Sig: Take 1 tablet (100 mg total) by mouth daily. Take with or immediately following a meal.    Dispense:  30 tablet    Refill:  11  . amLODipine-olmesartan (AZOR) 5-20 MG tablet    Sig: Take 1 tablet by mouth daily.    Dispense:  30 tablet    Refill:  1     Follow up: 3 weeks    Should you have any questions or concerns please call the internal medicine clinic at [phone].     Sanjuana Letters, D.O. [facility]

## 2020-02-16 NOTE — Assessment & Plan Note (Signed)
Patient states she would like to receive the COVID vaccine today. States after her visit she is going to get it. Also agrees to papsmear at next visit.

## 2020-02-16 NOTE — Assessment & Plan Note (Signed)
Assessment: Patient endorses chronic drowsiness and fatigue. Recent TSH, Hgb normal. Patient takes tramadol at night, suspect she may be feeling "groggy" from this. Also her diet of one meal a day may be contributing. Will discuss further at next visit. Patient also continues to deny sleep study, denied when offered by Dr. Tarri Abernethy as well.   Plan: TSH/Hgb normal Possible tramadol side effect/decrease in diet and exercise, and poor glycemic control Will continue to monitor at next visit

## 2020-02-18 ENCOUNTER — Other Ambulatory Visit: Payer: Self-pay | Admitting: Student

## 2020-02-18 DIAGNOSIS — K219 Gastro-esophageal reflux disease without esophagitis: Secondary | ICD-10-CM

## 2020-02-19 NOTE — Progress Notes (Signed)
Internal Medicine Clinic Attending  I saw and evaluated the patient.  I personally confirmed the key portions of the history and exam documented by Dr. [name] and I reviewed pertinent patient test results.  The assessment, diagnosis, and plan were formulated together and I agree with the documentation in the resident's note.  

## 2020-02-29 ENCOUNTER — Telehealth: Payer: Self-pay

## 2020-02-29 ENCOUNTER — Telehealth (HOSPITAL_COMMUNITY): Payer: Medicare Other | Admitting: Psychiatry

## 2020-02-29 ENCOUNTER — Other Ambulatory Visit: Payer: Self-pay

## 2020-02-29 NOTE — Telephone Encounter (Signed)
Checking with [place] [facility] to see if patient has active medicaid. Hubbard Hartshorn, BSN, RN-BC

## 2020-02-29 NOTE — Telephone Encounter (Signed)
Requesting to speak with a nurse about PCS. Please call pt's daughter back.

## 2020-03-01 NOTE — Telephone Encounter (Signed)
Confirmed with [place] patient does not have active Medicaid at this time so she will not qualify for PCS. Call placed to daughter. Explained that without MCD, PCS would be an out-of-pocket expense. She was unaware that MCD was not active. She will contact patient's Case Worker to see if MCD can be reinstated. Hubbard Hartshorn, BSN, RN-BC

## 2020-03-03 ENCOUNTER — Telehealth (INDEPENDENT_AMBULATORY_CARE_PROVIDER_SITE_OTHER): Payer: Medicare Other | Admitting: Psychiatry

## 2020-03-03 ENCOUNTER — Telehealth: Payer: Self-pay | Admitting: *Deleted

## 2020-03-03 ENCOUNTER — Encounter (HOSPITAL_COMMUNITY): Payer: Self-pay | Admitting: Psychiatry

## 2020-03-03 ENCOUNTER — Other Ambulatory Visit: Payer: Self-pay

## 2020-03-03 ENCOUNTER — Ambulatory Visit (HOSPITAL_COMMUNITY): Payer: Medicare Other | Admitting: Psychiatry

## 2020-03-03 ENCOUNTER — Other Ambulatory Visit (HOSPITAL_COMMUNITY): Payer: Self-pay | Admitting: Psychiatry

## 2020-03-03 ENCOUNTER — Telehealth (HOSPITAL_COMMUNITY): Payer: Self-pay | Admitting: *Deleted

## 2020-03-03 DIAGNOSIS — F250 Schizoaffective disorder, bipolar type: Secondary | ICD-10-CM

## 2020-03-03 DIAGNOSIS — F419 Anxiety disorder, unspecified: Secondary | ICD-10-CM

## 2020-03-03 MED ORDER — TRAZODONE HCL 100 MG PO TABS: 100.0000 mg | ORAL_TABLET | Freq: Every evening | ORAL | 0 refills | Status: DC | PRN

## 2020-03-03 MED ORDER — SERTRALINE HCL 100 MG PO TABS: 100.0000 mg | ORAL_TABLET | Freq: Every day | ORAL | 1 refills | Status: DC

## 2020-03-03 MED ORDER — INVEGA SUSTENNA 234 MG/1.5ML IM SUSY: 234.0000 mg | PREFILLED_SYRINGE | INTRAMUSCULAR | 1 refills | Status: DC

## 2020-03-03 NOTE — Telephone Encounter (Signed)
Pt's mother calls, pt in background, states pt BP 88/60 and she has been feeling dizzy, offered appt this day at 1545, mother agreed, pt stated no, she desires tomorrow. appt 11/19 at 1515.  Mother states she has stopped pt bp meds and told her to eat salt. Advised her not to eat salt but to increase her po water intake. Advised if pt became more dizzy, weak, short of breath, chest pain, n&v, to call 911 or go to ED. Pt and mother agreeable

## 2020-03-03 NOTE — Telephone Encounter (Signed)
Called patient to sch injection appt and was not able to reach her and [facility] for patient to call office back

## 2020-03-03 NOTE — Telephone Encounter (Signed)
Thank you Helen 

## 2020-03-03 NOTE — Progress Notes (Signed)
Counselor called Client, with Client's family member answering the phone, handing it off to her. Client stated that she was experiencing low blood pressure issues and was unable to meet for our session today. Client requested that we communicate next month instead. Counselor to schedule new appointment for Client. Client stated that she was safe and being observed by family members until she could receive treatment.   Lise Auer, LCSW

## 2020-03-03 NOTE — Progress Notes (Signed)
Virtual Visit via Telephone Note  I connected with [name] on 03/03/20 at  1:00 PM EST by telephone and verified that I am speaking with the correct person using two identifiers.  Location: Patient: home Provider: home office   I discussed the limitations, risks, security and privacy concerns of performing an evaluation and management service by telephone and the availability of in person appointments. I also discussed with the patient that there may be a patient responsible charge related to this service. The patient expressed understanding and agreed to proceed.   History of Present Illness: Patient is evaluated by phone session.  She is staying with her mother.  Her mother [name] was also present there.  She had to see Invega injection on October 26.  She is not taking by mouth and she feels that she does not needed by mouth Invega since she is getting injection.  Patient is still have limited insight into her illness.  Given the history that she has been multiple hospitalization and last admission was in June she feel that she has been doing well and she has to take the minimum amount of medication.  She reported things are going well and he does not want to add more medication.  She reported her sleep is good.  She is more active and walking every day.  She denies any hallucination, suicidal thoughts.  Her mother reported that she is not angry.  She also does not want to take trazodone since she is sleeping better.  Recently she had blood work.  Her hemoglobin A1c is 7.5.    Past Psychiatric History: H/Oschizoaffective d/owith at least 10inpatient and multiple suicidal attempt. H/O jumping from running car, OD on bleach andhypertensive medication. [facility] inpatient [facility]June2021[facility] Geodon helped. Took Depakote, Trazodone, perphenazine,temazepam, mirtazapine. Last inpatient switched to [facility].    Recent Results (from the past 2160 hour(s))   Glucose, capillary     Status: Abnormal   Collection Time: 02/16/20  3:25 PM  Result Value Ref Range   Glucose-Capillary 197 (H) 70 - 99 mg/dL    Comment: Glucose reference range applies only to samples taken after fasting for at least 8 hours.  POC Hbg A1C     Status: Abnormal   Collection Time: 02/16/20  3:37 PM  Result Value Ref Range   Hemoglobin A1C 7.5 (A) 4.0 - 5.6 %   HbA1c POC (<> result, manual entry)     HbA1c, POC (prediabetic range)     HbA1c, POC (controlled diabetic range)       Psychiatric Specialty Exam: Physical Exam  Review of Systems  Weight 166 lb (75.3 kg).There is no height or weight on file to calculate BMI.  General Appearance: superfical cooperative  Eye Contact:  NA  Speech:  Slow  Volume:  Decreased  Mood:  Irritable  Affect:  NA  Thought Process:  Descriptions of Associations: Intact  Orientation:  Full (Time, Place, and Person)  Thought Content:  Paranoid Ideation  Suicidal Thoughts:  No  Homicidal Thoughts:  No  Memory:  Immediate;   Fair Recent;   Fair Remote;   Fair  Judgement:  Fair  Insight:  Shallow  Psychomotor Activity:  NA  Concentration:  Concentration: Fair and Attention Span: Fair  Recall:  [employer] of Knowledge:  Good  Language:  Good  Akathisia:  No  Handed:  Right  AIMS (if indicated):     Assets:  Desire for [place]  ADL's:  Intact  Cognition:  WNL  Sleep:   ok     Assessment and Plan: Schizoaffective disorder, bipolar type.  Anxiety.  I reviewed blood work results.  Patient is reluctant to take oral Invega but agreed to take Invega injection every 4 weeks.  Continue Invega 234 mg intramuscular every month.  Encouraged to continue therapy with [facility].  Continue Zoloft 100 mg daily.  Patient like to have a trazodone in case she needed for sleep.  We will continue trazodone 100 mg to take as needed for insomnia.  Discussed medication side effects and benefits.  Recommended to call us back  if she is any question or any concern.  Follow-up in 2 months.  Follow Up Instructions:    I discussed the assessment and treatment plan with the patient. The patient was provided an opportunity to ask questions and all were answered. The patient agreed with the plan and demonstrated an understanding of the instructions.   The patient was advised to call back or seek an in-person evaluation if the symptoms worsen or if the condition fails to improve as anticipated.  I provided 17 minutes of non-face-to-face time during this encounter.   Kathlee Nations, MD

## 2020-03-04 ENCOUNTER — Other Ambulatory Visit: Payer: Self-pay

## 2020-03-04 ENCOUNTER — Ambulatory Visit (INDEPENDENT_AMBULATORY_CARE_PROVIDER_SITE_OTHER): Payer: Medicare Other | Admitting: Student

## 2020-03-04 DIAGNOSIS — I10 Essential (primary) hypertension: Secondary | ICD-10-CM

## 2020-03-04 DIAGNOSIS — E119 Type 2 diabetes mellitus without complications: Secondary | ICD-10-CM

## 2020-03-04 DIAGNOSIS — Z794 Long term (current) use of insulin: Secondary | ICD-10-CM

## 2020-03-04 MED ORDER — ACCU-CHEK GUIDE VI STRP: ORAL_STRIP | 5 refills | Status: AC

## 2020-03-04 MED ORDER — ACCU-CHEK SOFTCLIX LANCETS MISC: 12 refills | Status: DC

## 2020-03-04 MED ORDER — FERROUS SULFATE 325 (65 FE) MG PO TABS: 325.0000 mg | ORAL_TABLET | ORAL | 2 refills | Status: AC

## 2020-03-04 NOTE — Progress Notes (Signed)
CC: Symptomatic Hypotension  HPI:  [name] is a 54 y.o. female with a past medical history stated below and presents today for low blood pressure with dizziness. Please see problem based assessment and plan for additional details.  Past Medical History:  Diagnosis Date  . Alcohol abuse   . Anemia, iron deficiency   . Arteriosclerotic cardiovascular disease (ASCVD)    Minimal at cath in [facility].stress nuclear study in 8/08 with nl EF; neg stress echo in 2010  . Community acquired pneumonia 01/03/10, 05/2010, 04/2012   2011; with pleural effusion-[facility] acute resp failure; intubated in Jan 2014 (HMPV pneumonia)  . Depression   . Diabetes mellitus, type 2 ([place]) 2000   Onset in 2000; no insulin  . Diarrhea 10/14/2013   Started 10/10/13, improved with Imodium.   . Diastolic dysfunction    grade 2 per echo 2011  . Dysphagia   . Gastroesophageal reflux disease    Schatzki's ring  . History of alcohol abuse 07/22/2007   Qualifier: Diagnosis of  By: Lenn Cal    . Hyperlipidemia   . Hypertension `   during treatment with Geodon  . Hypokalemia 12/25/2013  . Left knee pain 08/25/2014  . Obesity   . Oral candidiasis 05/16/2017  . PTSD (post-traumatic stress disorder)   . Pulmonary hypertension ([place]) 05/02/2012   Patient needs repeat echo in 06/2012   . Schizoaffective disorder    requiring multiple psychiatric admissions  . Viral URI 05/12/2013  . Viral wart on finger 10/08/2016  . Vision changes 08/12/2014    Current Outpatient Medications on File Prior to Visit  Medication Sig Dispense Refill  . esomeprazole (NEXIUM) 40 MG capsule TAKE 1 CAPSULE BY MOUTH TWICE DAILY BEFORE A MEAL FOR ACID REFLUX 180 capsule 2  . insulin glargine (LANTUS) 100 UNIT/ML injection Inject 0.14 mLs (14 Units total) into the skin daily. For diabetes management 10 mL 0  . lovastatin (MEVACOR) 40 MG tablet Take 1 tablet (40 mg total) by mouth every evening. 90 tablet 0  .  paliperidone (INVEGA SUSTENNA) 234 MG/1.5ML SUSY injection Inject 234 mg into the muscle every 30 (thirty) days. 1.5 mL 1  . sertraline (ZOLOFT) 100 MG tablet Take 1 tablet (100 mg total) by mouth daily. For depression 30 tablet 1  . Thiamine HCl (VITAMIN B-1) 100 MG TABS Take 1 tablet (100 mg total) by mouth daily. 90 tablet 1  . traZODone (DESYREL) 100 MG tablet Take 1 tablet (100 mg total) by mouth at bedtime as needed for sleep. 30 tablet 0   Current Facility-Administered Medications on File Prior to Visit  Medication Dose Route Frequency Provider Last Rate Last Admin  . metFORMIN (GLUCOPHAGE-XR) 24 hr tablet 1,000 mg  1,000 mg Oral q morning - 10a Faust Thorington, MD      . paliperidone (INVEGA SUSTENNA) injection 234 mg  234 mg Intramuscular Q28 days Arfeen, Arlyce Harman, MD   234 mg at 02/09/20 1028    Family History  Problem Relation Age of Onset  . Hypertension Mother   . Stroke Father        deceased at age 14  . Colon cancer Other   . Heart disease Sister   . Diabetes Other   . High Cholesterol Other   . Arthritis Other   . Anesthesia problems Neg Hx   . Hypotension Neg Hx   . Malignant hyperthermia Neg Hx   . Pseudochol deficiency Neg Hx     Social  History   Socioeconomic History  . Marital status: Single    Spouse name: Not on file  . Number of children: 2  . Years of education: some colge  . Highest education level: Not on file  Occupational History  . Occupation: Disability  . Occupation: UNEMPLOYED    Employer: UNEMPLOYED  Tobacco Use  . Smoking status: Current Some Day Smoker    Packs/day: 1.00    Years: 30.00    Pack years: 30.00    Types: Cigarettes    Start date: 05/18/2012  . Smokeless tobacco: Never Used  . Tobacco comment: 1 PPD  Vaping Use  . Vaping Use: Never used  Substance and Sexual Activity  . Alcohol use: No    Alcohol/week: 0.0 standard drinks    Comment: hx of ETOH abuse  . Drug use: No  . Sexual activity: Not Currently     Partners: Male    Birth control/protection: None  Other Topics Concern  . 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 Difficulty of Paying Living Expenses: Not on file  Food Insecurity:   . Worried About Charity fundraiser in the Last Year: Not on file  . Ran Out of Food in the Last Year: Not on file  Transportation Needs: No Transportation Needs  . Lack of Transportation (Medical): No  . Lack of Transportation (Non-Medical): No  Physical Activity:   . Days of Exercise per Week: Not on file  . Minutes of Exercise per Session: Not on file  Stress:   . Feeling of Stress : Not on file  Social Connections: Unknown  . Frequency of Communication with Friends and Family: More than three times a week  . Frequency of Social Gatherings with Friends and Family: Three times a week  . Attends Religious Services: Not on file  . Active Member of Clubs or Organizations: Not on file  . Attends Archivist Meetings: Not on file  . Marital Status: Not on file  Intimate Partner Violence:   . Fear of Current or Ex-Partner: Not on file  . Emotionally Abused: Not on file  . Physically Abused: Not on file  . Sexually Abused: Not on file    Review of Systems: ROS negative except for what is noted on the assessment and plan.  Vitals:   03/04/20 1506  BP: 105/60  Pulse: (!) 106  Temp: 98.3 F (36.8 C)  TempSrc: Oral  SpO2: 100%     Physical Exam: Physical Exam Vitals and nursing note reviewed.  Constitutional:      General: She is not in acute distress.    Appearance: Normal appearance. She is not ill-appearing, toxic-appearing or diaphoretic.    Cardiovascular:     Rate and Rhythm: Normal rate.  Pulmonary:     Effort: Pulmonary effort is normal. No respiratory distress.     Breath sounds: Normal breath sounds.  Neurological:     General: No focal deficit present.     Mental Status: She is alert and oriented to person, place, and time. Mental status is at baseline.  Psychiatric:        Mood and Affect:  Mood normal.        Behavior: Behavior normal.        Thought Content: Thought content normal.        Judgment: Judgment normal.      Assessment & Plan:   See Encounters Tab for problem based charting.  Patient seen with Dr. Delano Metz, D.O. [facility], PGY-1 Pager: [phone], Phone: [phone] Date 03/04/2020 Time 5:37 PM

## 2020-03-04 NOTE — Assessment & Plan Note (Addendum)
Assessment: Patient was recently seen and her blood pressure medications were changed during her visit two weeks ago. The patient was switched from amlodipine 10 mg, losartan 50 mg, and metoprolol 100 mg BID to amlodipne-olmesartan 5-20mg , metoprolol succinate 100 mg 24 hr tab. Her spironolactone 50 mg tab was continued.   The patient notes when her mother went to pick up her medications, the pharmacist told her that these medications would cause her blood pressure to drop more than her prior medications. So the patient continued her prior regimen. She continued to be hypotensive with episodes of dizziness. Prior to her appointment today she did not take any of her blood pressure medications.   Orthostatics  Lying flat - 119/73 P 100 Sitting up 124/78 P 112 Standing 0 min 109/74 P 132 Standing 3 min 123/81 P 132  Patient orthostatic by pulse, was asymptomatic during the transitions.   Plan: - Encouraged patient to continue PO fluids - Will stop ALL blood pressure medications  - Patient to be seen next week for a BP recheck - Plan to add back medications one as a time as needed, will most likely start back metoprolol since it has minimal blood pressure effects if patient is mildly hypertensive.

## 2020-03-04 NOTE — Patient Instructions (Signed)
Thank you, Ms.[name] for allowing [name] to provide your care today. Today we discussed your blood pressure. We will be stopping all of your blood pressure medications and following up with you next week. If you check your blood pressure and see that it is high over the next few days, please give [name] a call.   We also went through your medication list and took out medications you are no longer taking.    I have ordered the following labs for you:  Lab Orders  No laboratory test(s) ordered today     Referrals ordered today:   Referral Orders  No referral(s) requested today     I have ordered the following medication/changed the following medications:   Stop the following medications: Medications Discontinued During This Encounter  Medication Reason  . albuterol (VENTOLIN HFA) 108 (90 Base) MCG/ACT inhaler No longer needed (for PRN medications)  . amLODipine-olmesartan (AZOR) 5-20 MG tablet Change in therapy  . metoprolol succinate (TOPROL XL) 100 MG 24 hr tablet Change in therapy  . nicotine (NICODERM CQ - DOSED IN MG/24 HR) 7 mg/24hr patch Change in therapy  . paliperidone (INVEGA) 3 MG 24 hr tablet Change in therapy  . senna-docusate (SENOKOT-S) 8.6-50 MG tablet Change in therapy  . spironolactone (ALDACTONE) 50 MG tablet Change in therapy  . Accu-Chek Softclix Lancets lancets Reorder  . ferrous sulfate 325 (65 FE) MG tablet Reorder  . ACCU-CHEK GUIDE test strip Reorder     Start the following medications: Meds ordered this encounter  Medications  . glucose blood (ACCU-CHEK GUIDE) test strip    Sig: USE THREE TIMES DAILY    Dispense:  100 strip    Refill:  5  . Accu-Chek Softclix Lancets lancets    Sig: Use as instructed    Dispense:  400 each    Refill:  12  . ferrous sulfate 325 (65 FE) MG tablet    Sig: Take 1 tablet (325 mg total) by mouth every other day. (May buy from over the counter): For anemia    Dispense:  15 tablet    Refill:  2     Follow up: 1  week    Should you have any questions or concerns please call the internal medicine clinic at [phone].     Sanjuana Letters, D.O. [place]

## 2020-03-08 ENCOUNTER — Encounter: Payer: Medicare Other | Admitting: Student

## 2020-03-08 NOTE — Progress Notes (Signed)
Internal Medicine Clinic Attending  I saw and evaluated the patient.  I personally confirmed the key portions of the history and exam documented by Dr. [name] and I reviewed pertinent patient test results.  The assessment, diagnosis, and plan were formulated together and I agree with the documentation in the resident's note.  

## 2020-03-09 ENCOUNTER — Other Ambulatory Visit: Payer: Self-pay

## 2020-03-09 ENCOUNTER — Ambulatory Visit (INDEPENDENT_AMBULATORY_CARE_PROVIDER_SITE_OTHER): Payer: Medicare Other | Admitting: Psychiatry

## 2020-03-09 VITALS — BP 134/83 | HR 89 | Temp 98.2°F | Ht 64.0 in | Wt 168.0 lb

## 2020-03-09 DIAGNOSIS — F250 Schizoaffective disorder, bipolar type: Secondary | ICD-10-CM | POA: Diagnosis not present

## 2020-03-09 NOTE — Patient Instructions (Signed)
Follow-up in 4 weeks

## 2020-03-09 NOTE — Progress Notes (Signed)
Patient came in and was given Invega injection Sustenna injection in right arm. Patient toledrated injection well. Patient to rreturn in 4 weeks.Patient voiced no complaints.

## 2020-03-29 ENCOUNTER — Other Ambulatory Visit: Payer: Self-pay | Admitting: Student

## 2020-03-29 DIAGNOSIS — I10 Essential (primary) hypertension: Secondary | ICD-10-CM

## 2020-03-31 ENCOUNTER — Other Ambulatory Visit: Payer: Self-pay | Admitting: Student

## 2020-03-31 ENCOUNTER — Other Ambulatory Visit: Payer: Self-pay | Admitting: Internal Medicine

## 2020-03-31 ENCOUNTER — Other Ambulatory Visit (HOSPITAL_COMMUNITY): Payer: Self-pay | Admitting: Psychiatry

## 2020-03-31 DIAGNOSIS — Z794 Long term (current) use of insulin: Secondary | ICD-10-CM

## 2020-03-31 DIAGNOSIS — F419 Anxiety disorder, unspecified: Secondary | ICD-10-CM

## 2020-03-31 MED ORDER — INSULIN GLARGINE 100 UNIT/ML ~~LOC~~ SOLN: 14.0000 [IU] | Freq: Every day | SUBCUTANEOUS | 2 refills | Status: DC

## 2020-04-01 ENCOUNTER — Telehealth (INDEPENDENT_AMBULATORY_CARE_PROVIDER_SITE_OTHER): Payer: Medicare Other | Admitting: Psychiatry

## 2020-04-01 ENCOUNTER — Telehealth (HOSPITAL_COMMUNITY): Payer: Self-pay | Admitting: Psychiatry

## 2020-04-01 ENCOUNTER — Other Ambulatory Visit: Payer: Self-pay

## 2020-04-01 DIAGNOSIS — F419 Anxiety disorder, unspecified: Secondary | ICD-10-CM

## 2020-04-01 DIAGNOSIS — F250 Schizoaffective disorder, bipolar type: Secondary | ICD-10-CM | POA: Diagnosis not present

## 2020-04-01 NOTE — Progress Notes (Signed)
Virtual Visit via Telephone Note  I connected with [name] on 04/01/20 at 11:00 AM EST by telephone and verified that I am speaking with the correct person using two identifiers.  Location: Patient: Home Provider: Home Office   I discussed the limitations, risks, security and privacy concerns of performing an evaluation and management service by telephone and the availability of in person appointments. I also discussed with the patient that there may be a patient responsible charge related to this service. The patient expressed understanding and agreed to proceed.   History of Present Illness: Patient is evaluated by phone.  Her mother [name] requested an earlier appointment.  Patient was not aware about this appointment.  As per mother patient received Invega injection and first dose of Moderna vaccine together few days ago and since then she has been very difficult.  Mother reported that she is slamming door, agitated, angry and refused to take all the medication however her son involved in she is back on oral hypertensive medication.  Patient mother reported that she does not want to call 911 before the Christmas but wanted to have some medication adjustment.  When I talked to the patient she was very upset and denied about.  She reported that things are going well.  She has been sleeping good.  She like receiving injection and so far she has no issues.  She admitted getting vaccine few days ago and she have stomach issues.  When I asked if she refuses to take the medicine she replied "I am taking it because my mother wants me to take it ".  Patient is very upset on her mother and she also got upset with me when I ask about her behavior after the vaccine.  Patient's mother is very concerned because she does not cooperate and does not involve in her treatment plan.  Patient is living with the mother.  Patient denied suicidal thoughts, hallucination, insomnia, anxiety attacks, crying spells or  any homicidal thoughts.  She reschedule her appointment with the therapist because she was not feeling well 4 weeks ago.  She saw her PCP last month and her hemoglobin A1c is 7.5.  Last year her hemoglobin A1c was above 8.  Patient denies any medication side effects.  Her mother reported that she has been consistent with Invega injection.   Past Psychiatric History: H/Oschizoaffective d/owith at least 10inpatient and multiple suicidal attempt.H/Ojumping from running car, OD onbleach andhypertensive medication. [facility] Geodon helped. Took Depakote, Trazodone, perphenazine,temazepam, mirtazapine. Last inpatient switchedtoInvega.   Psychiatric Specialty Exam: Physical Exam  Review of Systems  There were no vitals taken for this visit.There is no height or weight on file to calculate BMI.  General Appearance: NA  Eye Contact:  NA  Speech:  Slow  Volume:  Decreased  Mood:  Irritable  Affect:  NA  Thought Process:  Descriptions of Associations: Intact  Orientation:  Full (Time, Place, and Person)  Thought Content:  Paranoid Ideation  Suicidal Thoughts:  No  Homicidal Thoughts:  No  Memory:  Immediate;   Good Recent;   Fair Remote;   Fair  Judgement:  Fair  Insight:  Shallow  Psychomotor Activity:  NA  Concentration:  Concentration: Fair and Attention Span: Fair  Recall:  [employer] of Knowledge:  Fair  Language:  Fair  Akathisia:  No  Handed:  Right  AIMS (if indicated):     Assets:  Housing Social Support  ADL's:  Intact  Cognition:  WNL  Sleep:   reported good      Assessment and Plan: Schizoaffective disorder, bipolar type.  Anxiety.  Discussed with the patient and her mother.  Her mother concerns about her behavior and described after the vaccine she is more agitated, angry and had stopped the medicine but recently resumed.  I discussed with the mother that if patient started aggressive, violent  and risk of hurting herself or anyone else then she need to call 911 or go to local emergency room.  At this time patient is not interested to change or adjust the medication.  However she has limited insight into her illness and she gets very upset, defiance when we talk about underlying psychiatric illness.  She believes her mother about her past hospitalization.  We have discussed in the past that she may need a higher level of care if patient remains difficult to manage..  We discussed possible ACT services.  We will keep the appointment in 6 weeks which already schedule.  I also encouraged that she should talk to the therapist on a regular basis.  Follow-up in 6 weeks.  No change in her current medication.     Follow Up Instructions:    I discussed the assessment and treatment plan with the patient. The patient was provided an opportunity to ask questions and all were answered. The patient agreed with the plan and demonstrated an understanding of the instructions.   The patient was advised to call back or seek an in-person evaluation if the symptoms worsen or if the condition fails to improve as anticipated.  I provided 18 minutes of non-face-to-face time during this encounter.   Kathlee Nations, MD

## 2020-04-04 ENCOUNTER — Telehealth: Payer: Self-pay | Admitting: *Deleted

## 2020-04-04 NOTE — Telephone Encounter (Signed)
Thanks

## 2020-04-04 NOTE — Telephone Encounter (Signed)
Pt calls and states she had started feeling pretty good and her mother "put me back on all my medicine and says she knows more than the doctor". She states she is having headaches from all the medicine. appt tues 12/21 at 1545

## 2020-04-05 ENCOUNTER — Ambulatory Visit (INDEPENDENT_AMBULATORY_CARE_PROVIDER_SITE_OTHER): Payer: Medicare Other | Admitting: Internal Medicine

## 2020-04-05 ENCOUNTER — Encounter: Payer: Self-pay | Admitting: Internal Medicine

## 2020-04-05 VITALS — BP 128/81 | HR 87 | Temp 98.7°F | Ht 64.0 in | Wt 180.2 lb

## 2020-04-05 DIAGNOSIS — Z794 Long term (current) use of insulin: Secondary | ICD-10-CM

## 2020-04-05 DIAGNOSIS — E119 Type 2 diabetes mellitus without complications: Secondary | ICD-10-CM | POA: Diagnosis not present

## 2020-04-05 DIAGNOSIS — F250 Schizoaffective disorder, bipolar type: Secondary | ICD-10-CM

## 2020-04-05 MED ORDER — FREESTYLE LIBRE 2 SENSOR MISC: 3 refills | Status: DC

## 2020-04-05 MED ORDER — FREESTYLE LIBRE 2 READER DEVI: 1.0000 | Freq: Three times a day (TID) | 0 refills | Status: DC

## 2020-04-05 NOTE — Assessment & Plan Note (Signed)
Type 2 diabetes mellitus: [name] has a medical history significant for schizoaffective disorder, bipolar type who is presenting today with her sister due to concern for patient refusing her daily capillary blood glucose while on insulin.  Her last A1c when checked in November was 2021 was 7.5%  Assessment and plan: Unfortunately, Medicare does not cover freestyle libre or Dexcom and patient will have to pay out-of-pocket.  I have sent in a prescription for freestyle libre for price check.  I had a discussion with patient and her sister who agrees that if freestyle [name] is expensive, did like to opt for medication that will enable [name] to bypass capillary glucose checks.  A. If freestyle libre affordable -Continue Lantus 14 units daily -Continue Metformin 1000 mg daily   B.  If freestyle libre unaffordable -Start Trulicity 0.7 mg weekly OR start Jardiance 10 mg daily -Decrease Lantus from 14 units daily to 7 units daily -Continue Metformin 1000 mg daily  She will follow up with [name] when she returns from vacation.

## 2020-04-05 NOTE — Patient Instructions (Signed)
Ms. Slattery,  It was a pleasure taking care of you here in the clinic.  I have since a prescription for the glucose meter and the sensor.  Please pick this up and bring you to your appointment with [name].  Take care! Dr. Eileen Stanford  Please call the internal medicine center clinic if you have any questions or concerns, we may be able to help and keep you from a long and expensive emergency room wait. Our clinic and after hours phone number is [phone], the best time to call is Monday through Friday 9 am to 4 pm but there is always someone available 24/7 if you have an emergency. If you need medication refills please notify your pharmacy one week in advance and they will send [name] a request.   If you have not gotten the COVID vaccine, I recommend doing so:  You may get it at your local [facility] or [facility] OR To schedule an appointment for a COVID vaccine or be added to the vaccine wait list: Go to [email or web address]   OR Go to [email or web address]                  OR Call [phone]                                     OR Call [phone] and select Option 2

## 2020-04-05 NOTE — Progress Notes (Signed)
   CC: Follow-up diabetes mellitus  HPI:  [name] is a 54 y.o. with medical history listed below presenting to follow-up on diabetes mellitus.  Please see problem based charting for further details.  Past Medical History:  Diagnosis Date  . Alcohol abuse   . Anemia, iron deficiency   . Arteriosclerotic cardiovascular disease (ASCVD)    Minimal at cath in [facility].stress nuclear study in 8/08 with nl EF; neg stress echo in 2010  . Community acquired pneumonia 01/03/10, 05/2010, 04/2012   2011; with pleural effusion-[facility] acute resp failure; intubated in Jan 2014 (HMPV pneumonia)  . Depression   . Diabetes mellitus, type 2 ([place]) 2000   Onset in 2000; no insulin  . Diarrhea 10/14/2013   Started 10/10/13, improved with Imodium.   . Diastolic dysfunction    grade 2 per echo 2011  . Dysphagia   . Gastroesophageal reflux disease    Schatzki's ring  . History of alcohol abuse 07/22/2007   Qualifier: Diagnosis of  By: Lenn Cal    . Hyperlipidemia   . Hypertension `   during treatment with Geodon  . Hypokalemia 12/25/2013  . Left knee pain 08/25/2014  . Obesity   . Oral candidiasis 05/16/2017  . PTSD (post-traumatic stress disorder)   . Pulmonary hypertension ([place]) 05/02/2012   Patient needs repeat echo in 06/2012   . Schizoaffective disorder    requiring multiple psychiatric admissions  . Viral URI 05/12/2013  . Viral wart on finger 10/08/2016  . Vision changes 08/12/2014   Review of Systems:  As per HPI  Physical Exam:  Vitals:   04/05/20 1525  BP: 128/81  Pulse: 87  Temp: 98.7 F (37.1 C)  TempSrc: Oral  SpO2: 100%  Weight: 180 lb 3.2 oz (81.7 kg)  Height: 5\' 4"  (1.626 m)   Physical Exam Vitals and nursing note reviewed.  HENT:     Head: Normocephalic and atraumatic.  Cardiovascular:     Heart sounds: Normal heart sounds.  Pulmonary:     Breath sounds: Normal breath sounds.  Neurological:     Mental Status: She is alert.  Psychiatric:         Mood and Affect: Mood normal.        Behavior: Behavior normal.     Assessment & Plan:   See Encounters Tab for problem based charting.  Patient discussed with Dr. Dareen Piano

## 2020-04-06 ENCOUNTER — Other Ambulatory Visit: Payer: Self-pay

## 2020-04-06 ENCOUNTER — Telehealth: Payer: Self-pay | Admitting: *Deleted

## 2020-04-06 ENCOUNTER — Ambulatory Visit (INDEPENDENT_AMBULATORY_CARE_PROVIDER_SITE_OTHER): Payer: Medicare Other | Admitting: *Deleted

## 2020-04-06 VITALS — BP 143/83 | HR 78 | Temp 97.6°F | Ht 64.0 in | Wt 177.4 lb

## 2020-04-06 DIAGNOSIS — E119 Type 2 diabetes mellitus without complications: Secondary | ICD-10-CM

## 2020-04-06 DIAGNOSIS — F250 Schizoaffective disorder, bipolar type: Secondary | ICD-10-CM | POA: Diagnosis not present

## 2020-04-06 NOTE — Patient Instructions (Signed)
Follow-up in 4 weeks

## 2020-04-06 NOTE — Progress Notes (Signed)
Patient came in and wasgiven InvegainjectionSustenna 234mg /1.5 mL injection in rightarm. Patient toledrated injection well. Patient to return in 4 weeks.Patient voiced no complaints.

## 2020-04-06 NOTE — Telephone Encounter (Signed)
Information was called to [facility] Medicare for [employer].  Equipment to be sent to patient within the next 14 days.  Patient will recive 7 Sensors to start which will be non-returnable.  Sander Nephew, RN 04/06/2020 11:12 AM.

## 2020-04-08 ENCOUNTER — Emergency Department (HOSPITAL_COMMUNITY)
Admission: EM | Admit: 2020-04-08 | Discharge: 2020-04-09 | Disposition: A | Discharge status: Home / Self Care | Payer: Medicare Other | Attending: Emergency Medicine | Admitting: Emergency Medicine

## 2020-04-08 ENCOUNTER — Other Ambulatory Visit: Payer: Self-pay

## 2020-04-08 DIAGNOSIS — I110 Hypertensive heart disease with heart failure: Secondary | ICD-10-CM | POA: Insufficient documentation

## 2020-04-08 DIAGNOSIS — E119 Type 2 diabetes mellitus without complications: Secondary | ICD-10-CM

## 2020-04-08 DIAGNOSIS — F309 Manic episode, unspecified: Secondary | ICD-10-CM | POA: Diagnosis not present

## 2020-04-08 DIAGNOSIS — I5030 Unspecified diastolic (congestive) heart failure: Secondary | ICD-10-CM | POA: Insufficient documentation

## 2020-04-08 DIAGNOSIS — Z79899 Other long term (current) drug therapy: Secondary | ICD-10-CM | POA: Insufficient documentation

## 2020-04-08 DIAGNOSIS — F29 Unspecified psychosis not due to a substance or known physiological condition: Secondary | ICD-10-CM | POA: Diagnosis not present

## 2020-04-08 DIAGNOSIS — E1165 Type 2 diabetes mellitus with hyperglycemia: Secondary | ICD-10-CM | POA: Insufficient documentation

## 2020-04-08 DIAGNOSIS — F259 Schizoaffective disorder, unspecified: Secondary | ICD-10-CM | POA: Diagnosis not present

## 2020-04-08 DIAGNOSIS — F17210 Nicotine dependence, cigarettes, uncomplicated: Secondary | ICD-10-CM | POA: Insufficient documentation

## 2020-04-08 DIAGNOSIS — Z794 Long term (current) use of insulin: Secondary | ICD-10-CM | POA: Diagnosis not present

## 2020-04-08 DIAGNOSIS — F3010 Manic episode without psychotic symptoms, unspecified: Secondary | ICD-10-CM

## 2020-04-08 DIAGNOSIS — Z046 Encounter for general psychiatric examination, requested by authority: Secondary | ICD-10-CM | POA: Diagnosis not present

## 2020-04-08 DIAGNOSIS — J449 Chronic obstructive pulmonary disease, unspecified: Secondary | ICD-10-CM | POA: Diagnosis not present

## 2020-04-08 LAB — I-STAT BETA HCG BLOOD, ED (MC, WL, AP ONLY): I-stat hCG, quantitative: <5 m[IU]/mL (ref <5)

## 2020-04-08 LAB — CBC
HCT: 39.1 % (ref 36.0–46.0)
Hemoglobin: 12.9 g/dL (ref 12.0–15.0)
MCH: 29.3 pg (ref 26.0–34.0)
MCHC: 33 g/dL (ref 30.0–36.0)
MCV: 88.7 fL (ref 80.0–100.0)
Platelets: 184 10*3/uL (ref 150–400)
RBC: 4.41 MIL/uL (ref 3.87–5.11)
RDW: 15.5 % (ref 11.5–15.5)
WBC: 6.8 10*3/uL (ref 4.0–10.5)
nRBC: 0 % (ref 0.0–0.2)

## 2020-04-08 LAB — COMPREHENSIVE METABOLIC PANEL
ALT: 10 U/L (ref 0–44)
AST: 12 U/L — ABNORMAL LOW (ref 15–41)
Albumin: 4.1 g/dL (ref 3.5–5.0)
Alkaline Phosphatase: 73 U/L (ref 38–126)
Anion gap: 9 (ref 5–15)
BUN: 19 mg/dL (ref 6–20)
CO2: 23 mmol/L (ref 22–32)
Calcium: 9.4 mg/dL (ref 8.9–10.3)
Chloride: 106 mmol/L (ref 98–111)
Creatinine, Ser: 1.27 mg/dL — ABNORMAL HIGH (ref 0.44–1.00)
GFR, Estimated: 51 mL/min — ABNORMAL LOW (ref >60)
Glucose, Bld: 245 mg/dL — ABNORMAL HIGH (ref 70–99)
Potassium: 4 mmol/L (ref 3.5–5.1)
Sodium: 138 mmol/L (ref 135–145)
Total Bilirubin: 0.5 mg/dL (ref 0.3–1.2)
Total Protein: 6.9 g/dL (ref 6.5–8.1)

## 2020-04-08 LAB — ETHANOL: Alcohol, Ethyl (B): <10 mg/dL (ref <10)

## 2020-04-08 LAB — ACETAMINOPHEN LEVEL: Acetaminophen (Tylenol), Serum: <10 ug/mL — ABNORMAL LOW (ref 10–30)

## 2020-04-08 LAB — SALICYLATE LEVEL: Salicylate Lvl: <7 mg/dL — ABNORMAL LOW (ref 7.0–30.0)

## 2020-04-08 LAB — CBG MONITORING, ED: Glucose-Capillary: 113 mg/dL — ABNORMAL HIGH (ref 70–99)

## 2020-04-08 MED ORDER — ZIPRASIDONE MESYLATE 20 MG IM SOLR
20.0000 mg | Freq: Once | INTRAMUSCULAR | Status: AC
  Administered 2020-04-08: 17:00:00 20 mg via INTRAMUSCULAR

## 2020-04-08 MED ORDER — METFORMIN HCL 500 MG PO TABS
1000.0000 mg | ORAL_TABLET | Freq: Every day | ORAL | Status: DC
  Filled 2020-04-08: qty 2

## 2020-04-08 MED ORDER — STERILE WATER FOR INJECTION IJ SOLN
INTRAMUSCULAR | Status: AC
  Administered 2020-04-08: 17:00:00 10 mL
  Filled 2020-04-08: qty 10

## 2020-04-08 MED ORDER — DIPHENHYDRAMINE HCL 50 MG/ML IJ SOLN
50.0000 mg | Freq: Once | INTRAMUSCULAR | Status: AC
  Administered 2020-04-08: 18:00:00 50 mg via INTRAMUSCULAR
  Filled 2020-04-08: qty 1

## 2020-04-08 MED ORDER — LORAZEPAM 2 MG/ML IJ SOLN
2.0000 mg | Freq: Once | INTRAMUSCULAR | Status: AC
  Administered 2020-04-08: 18:00:00 2 mg via INTRAMUSCULAR
  Filled 2020-04-08: qty 1

## 2020-04-08 MED ORDER — ACETAMINOPHEN 325 MG PO TABS: 650.0000 mg | ORAL_TABLET | ORAL | Status: DC | PRN

## 2020-04-08 MED ORDER — INSULIN ASPART 100 UNIT/ML ~~LOC~~ SOLN
2.0000 [IU] | Freq: Once | SUBCUTANEOUS | Status: AC
  Administered 2020-04-08: 19:00:00 2 [IU] via SUBCUTANEOUS
  Filled 2020-04-08: qty 0.02

## 2020-04-08 NOTE — ED Notes (Signed)
Pt out in hall, manic stating Covid is in the hospital, people have wrong color mask on. Pt struggling against daughter to go back to room. Britni PA assessed pt. Meds ordered and Pt is IVC'd.

## 2020-04-08 NOTE — BH Assessment (Signed)
Comprehensive Clinical Assessment (CCA) Screening, Triage and Referral Note  04/09/2020 [name] [id number]  -Clinician reviewed note by [name], PA.  Arrived with sister provides majority of history.  States patient has recently had some medication changes.  Has been having frequent outbursts at home.  Over the last few days she has been hyper religious, threatening, pressured, rapid speech.  Has been inpatient multiple times previously.  She does not do any illicit drugs.  She no longer alcohol. Patient denies any SI, HI.   On arrival patient with rapid, pressured speech.  Hyperreligious.  Screaming, threatening staff as well as family member.  She throws her hands up in the air and is trying to run stating " Jesus is near" "It is all ending"  Patient said that she is always having to be seen by psychiatry when she comes to the hospital.  She said she had been brought in by her sister because of needing to have her blood sugar and her blood pressure checked.  Patient says that she is not having any SI, HI or A/V hallucinations.  She denies being manic.  Patient said that she is tired of staying at her mother's home.  She would rather be staying with her son [name].    Pt is given an Haldol shot monthly.  She last had it on Thursday (12/23).  Pt reports that she is only on the [place].  Patient has good eye contact and is oriented x3.  Patient does not appear to be responding to internal stimuli at this time.  She does not voice any delusional thoughts at this time.  Pt did have to be given benadryl, ativan and geodon earlier on 12/24 due to agitation.    Pt's sister brought her to [facility].  Clinician was given permission to contact her.  Sister said that patient had a manic episode.  She has been off for a few weeks.  She has been not taking her insulin or checking her sugar.  Sister confirmed that patient took her [place] shot on Thursday..  Sister reports that today (12/24) patient  was tearing things off the wall.  Going outside and yelling "at the top of her lungs" about everyone dying.  She was talking about her mother and stepfather dying tonight like she wants to have them die.  Pt was talking about her being God's wife and everyone in the world are her children.  Pt won't let anyone help her monitor her sugar and BP which is what she needs to monitor.  Sister said that patient has been admitted to facilities.   Patient was at [facility] in 10/12/19 and in 08/2017.  Pt is seen by Dr. Adele Schilder at [facility] outpt [place].  -Clinician discussed patient care with [name], PA who recommends overnight observation and psychiatry to see patient in the morning.  Chief Complaint:  Chief Complaint  Patient presents with   Psychiatric Evaluation        Manic Behavior   Visit Diagnosis: Schizoaffective d/o  Patient Reported Information How did you hear about [name]? Family/Friend (Pt's sister [name] brought her in.)   Referral name: [name], sister   Referral phone number: [phone]  [place] do you see for routine medical problems? Other (Comment) (Sees Dr. Adele Schilder for psychiatry)   Practice/Facility Name: No data recorded  Practice/Facility Phone Number: No data recorded  Name of Contact: No data recorded  Contact Number: No data recorded  Contact Fax Number: No data recorded  Prescriber Name: No data recorded  Prescriber Address (if known): No data recorded What Is the Reason for Your Visit/Call Today? Pt says her sister brought her over because she thought patient's blood pressure or glocose might be irregular.  Pt lives with her mother and her step father in [place].  Patient says she wanted to go home to see her grandchildren open presents this morning.  Patient said that she "had told people what she wanted to say and they did not like it."  Patient denies any SI or HI.  No A/V hallucinations.  How Long Has This Been Causing You Problems? > than 6 months  Have  You Recently Been in Any Inpatient Treatment (Hospital/Detox/Crisis Center/28-Day Program)? No   Name/Location of Program/Hospital:No data recorded  How Long Were You There? No data recorded  When Were You Discharged? No data recorded Have You Ever Received Services From [facility] Before? Yes   Who Do You See at [facility]? Dr. Lolly Mustache at [facility] outpatient in [place]  Have You Recently Had Any Thoughts About Hurting Yourself? No   Are You Planning to Commit Suicide/Harm Yourself At This time?  No  Have you Recently Had Thoughts About Hurting Someone Karolee Ohs? No   Explanation: No data recorded Have You Used Any Alcohol or Drugs in the Past 24 Hours? No   How Long Ago Did You Use Drugs or Alcohol?  No data recorded  What Did You Use and How Much? No data recorded What Do You Feel Would Help You the Most Today? No data recorded Do You Currently Have a Therapist/Psychiatrist? Yes   Name of Therapist/Psychiatrist: Dr. Kathryne Sharper   Have You Been Recently Discharged From Any Office Practice or Programs? No   Explanation of Discharge From Practice/Program:  No data recorded    CCA Screening Triage Referral Assessment Type of Contact: Tele-Assessment   Is this Initial or Reassessment? Initial Assessment   Date Telepsych consult ordered in [facility]:  04/08/2020   Time Telepsych consult ordered in [facility]:  1958  Patient Reported Information Reviewed? Yes   Patient Left Without Being Seen? No data recorded  Reason for Not Completing Assessment: No data recorded Collateral Involvement: None  Does Patient Have a Court Appointed Legal Guardian? No data recorded  Name and Contact of Legal Guardian:  No data recorded If Minor and Not Living with Parent(s), Who has Custody? No data recorded Is CPS involved or ever been involved? In the Past (Per chart.)  Is APS involved or ever been involved? Never  Patient Determined To Be At Risk for Harm To Self or Others Based on Review of Patient  Reported Information or Presenting Complaint? No   Method: No Plan   Availability of Means: No access or NA   Intent: Vague intent or NA   Notification Required: No need or identified person   Additional Information for Danger to Others Potential:  No data recorded  Additional Comments for Danger to Others Potential:  The patient notes no current thoughts of harming others   Are There Guns or Other Weapons in Your Home?  No    Types of Guns/Weapons: No data recorded   Are These Weapons Safely Secured?                              -- (NA)    Who Could Verify You Are Able To Have These Secured:    No data recorded Do  You Have any Outstanding Charges, Pending Court Dates, Parole/Probation? No data recorded Contacted To Inform of Risk of Harm To Self or Others: No data recorded Location of Assessment: [facility]  Does Patient Present under Involuntary Commitment? No   IVC Papers Initial File Date: No data recorded  [place] of Residence: [place]  Patient Currently Receiving the Following Services: Medication Management; Individual Therapy   Determination of Need: Urgent (48 hours)   Options For Referral: Therapeutic Triage Services   [place], LCAS         Comprehensive Clinical Assessment (CCA) Screening, Triage and Referral Note  04/09/2020 [name] [id number]  Chief Complaint:  Chief Complaint  Patient presents with   Psychiatric Evaluation        Manic Behavior   Visit Diagnosis: Schizoaffective d/o  Patient Reported Information How did you hear about [name]? Family/Friend (Pt's sister Deanna Artis brought her in.)   Referral name: Alyce Pagan, sister   Referral phone number: [phone]  Whom do you see for routine medical problems? Other (Comment) (Sees Dr. Lolly Mustache for psychiatry)   Practice/Facility Name: No data recorded  Practice/Facility Phone Number: No data recorded  Name of Contact: No data recorded  Contact Number: No data  recorded  Contact Fax Number: No data recorded  Prescriber Name: No data recorded  Prescriber Address (if known): No data recorded What Is the Reason for Your Visit/Call Today? Pt says her sister brought her over because she thought patient's blood pressure or glocose might be irregular.  Pt lives with her mother and her step father in [place].  Patient says she wanted to go home to see her grandchildren open presents this morning.  Patient said that she "had told people what she wanted to say and they did not like it."  Patient denies any SI or HI.  No A/V hallucinations.  How Long Has This Been Causing You Problems? > than 6 months  Have You Recently Been in Any Inpatient Treatment (Hospital/Detox/Crisis Center/28-Day Program)? No   Name/Location of Program/Hospital:No data recorded  How Long Were You There? No data recorded  When Were You Discharged? No data recorded Have You Ever Received Services From [facility] Before? Yes   Who Do You See at [facility]? Dr. Lolly Mustache at [facility] outpatient in [place]  Have You Recently Had Any Thoughts About Hurting Yourself? No   Are You Planning to Commit Suicide/Harm Yourself At This time?  No  Have you Recently Had Thoughts About Hurting Someone Karolee Ohs? No   Explanation: No data recorded Have You Used Any Alcohol or Drugs in the Past 24 Hours? No   How Long Ago Did You Use Drugs or Alcohol?  No data recorded  What Did You Use and How Much? No data recorded What Do You Feel Would Help You the Most Today? No data recorded Do You Currently Have a Therapist/Psychiatrist? Yes   Name of Therapist/Psychiatrist: Dr. Kathryne Sharper   Have You Been Recently Discharged From Any Office Practice or Programs? No   Explanation of Discharge From Practice/Program:  No data recorded    CCA Screening Triage Referral Assessment Type of Contact: Tele-Assessment   Is this Initial or Reassessment? Initial Assessment   Date Telepsych consult ordered in [facility]:   04/08/2020   Time Telepsych consult ordered in [facility]:  1958  Patient Reported Information Reviewed? Yes   Patient Left Without Being Seen? No data recorded  Reason for Not Completing Assessment: No data recorded Collateral Involvement: None  Does  Patient Have a Stage manager Guardian? No data recorded  Name and Contact of Legal Guardian:  No data recorded If Minor and Not Living with Parent(s), Who has Custody? No data recorded Is CPS involved or ever been involved? In the Past (Per chart.)  Is APS involved or ever been involved? Never  Patient Determined To Be At Risk for Harm To Self or Others Based on Review of Patient Reported Information or Presenting Complaint? No   Method: No Plan   Availability of Means: No access or NA   Intent: Vague intent or NA   Notification Required: No need or identified person   Additional Information for Danger to Others Potential:  No data recorded  Additional Comments for Danger to Others Potential:  The patient notes no current thoughts of harming others   Are There Guns or Other Weapons in Your Home?  No    Types of Guns/Weapons: No data recorded   Are These Weapons Safely Secured?                              -- (NA)    Who Could Verify You Are Able To Have These Secured:    No data recorded Do You Have any Outstanding Charges, Pending Court Dates, Parole/Probation? No data recorded Contacted To Inform of Risk of Harm To Self or Others: No data recorded [facility]: [facility]  Does Patient Present under Involuntary Commitment? No   IVC Papers Initial File Date: No data recorded  [place] of Residence: [place]  Patient Currently Receiving the Following Services: Medication Management; Individual Therapy   Determination of Need: Urgent (48 hours)   Options For Referral: Therapeutic Triage Services   Curlene Dolphin Ray, LCAS

## 2020-04-08 NOTE — ED Notes (Signed)
Patient is awake now. Will consult TTS

## 2020-04-08 NOTE — ED Triage Notes (Signed)
Patient reports to the ER BIB daughter for psych Eval. Patient is exhibiting manic behavior and not making sense. Patient is paranoid around medical staff and clinging to daughter for comfort. Patient then going into laughing fits. Patient has not been taking prescribed medication.

## 2020-04-08 NOTE — ED Notes (Signed)
Pt allowed staff to get bloodwork and an EKG, is refusing COVID swab at this time.

## 2020-04-08 NOTE — ED Notes (Signed)
Unable to obtain blood work at this time.

## 2020-04-08 NOTE — ED Notes (Signed)
Patient dancing and talking to herself in room, not cooperating with staff. Security at bedside.

## 2020-04-08 NOTE — ED Provider Notes (Signed)
[facility] DEPT Provider Note   CSN: [id number] Arrival date & time: 04/08/20  1554    History Chief Complaint  Patient presents with  . Psychiatric Evaluation       . Manic Behavior    [name] is a 54 y.o. female with past history of EtOH use, type 2 diabetes, schizoaffective disorder who presents for evaluation of manic behavior.  Arrived with sister provides majority of history.  States patient has recently had some medication changes.  Has been having frequent outbursts at home.  Over the last few days she has been hyper religious, threatening, pressured, rapid speech.  Has been inpatient multiple times previously.  She does not do any illicit drugs.  She no longer alcohol.  Patient denies any SI, HI.    On arrival patient with rapid, pressured speech.  Hyperreligious.  Screaming, threatening staff as well as family member.  She throws her hands up in the air and is trying to run stating " Jesus is near" "It is all ending"  Level 5 Caveat- Psychosis  HPI     Past Medical History:  Diagnosis Date  . Alcohol abuse   . Anemia, iron deficiency   . Arteriosclerotic cardiovascular disease (ASCVD)    Minimal at cath in [facility].stress nuclear study in 8/08 with nl EF; neg stress echo in 2010  . Community acquired pneumonia 01/03/10, 05/2010, 04/2012   2011; with pleural effusion-hosp [facility] acute resp failure; intubated in Jan 2014 (HMPV pneumonia)  . Depression   . Diabetes mellitus, type 2 ([place]) 2000   Onset in 2000; no insulin  . Diarrhea 10/14/2013   Started 10/10/13, improved with Imodium.   . Diastolic dysfunction    grade 2 per echo 2011  . Dysphagia   . Gastroesophageal reflux disease    Schatzki's ring  . History of alcohol abuse 07/22/2007   Qualifier: Diagnosis of  By: Lenn Cal    . Hyperlipidemia   . Hypertension `   during treatment with Geodon  . Hypokalemia 12/25/2013  . Left knee pain 08/25/2014  .  Obesity   . Oral candidiasis 05/16/2017  . PTSD (post-traumatic stress disorder)   . Pulmonary hypertension ([place]) 05/02/2012   Patient needs repeat echo in 06/2012   . Schizoaffective disorder    requiring multiple psychiatric admissions  . Viral URI 05/12/2013  . Viral wart on finger 10/08/2016  . Vision changes 08/12/2014    Patient Active Problem List   Diagnosis Date Noted  . Weight loss 06/15/2019  . Yeast infection 06/11/2019  . Lithium toxicity 06/03/2019  . Warts 02/19/2019  . Bipolar I disorder, most recent episode (or current) manic ([place]) 09/14/2018  . Schizoaffective disorder, bipolar type ([place]) 08/26/2017  . Healthcare maintenance 04/17/2017  . Nuclear sclerosis of both eyes 04/02/2016  . 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 Encounter for health education 10/24/2014  . IIH (idiopathic intracranial hypertension) 08/12/2014  . Seasonal allergies 02/23/2014  . Daytime hypersomnolence 12/09/2012  . Insomnia 11/06/2012  . COPD (chronic obstructive pulmonary disease) ([place]) 10/20/2012  . GERD (gastroesophageal reflux disease) 10/15/2012  . Preventative health care 10/15/2012  . Pulmonary hypertension ([place]) 05/02/2012  . Diastolic heart failure ([place]) 02/07/2012  . IDA (iron deficiency anemia) 11/06/2011  . Nausea & vomiting 07/06/2011  . Fatigue 05/17/2011  . Arteriosclerotic cardiovascular disease (ASCVD)   . TOBACCO ABUSE  09/27/2009  . Back pain 12/13/2008  . Hyperlipidemia 07/22/2007  . Obesity 07/22/2007  . Resistant hypertension 07/22/2007  . Diabetes mellitus type 2, controlled ([place]) 09/28/2002    Past Surgical History:  Procedure Laterality Date  . COLONOSCOPY  01/2006   internal hemorrhoids  . COLONOSCOPY  01/10/2012   Dr. Rourk:Single anal canal hemorrhoidal tag likely source of  trivial hematochezia; right-sided colonic  diverticulosis  . DILATION AND CURETTAGE, DIAGNOSTIC / THERAPEUTIC  1992  . ESOPHAGEAL DILATION N/A 08/18/2015   Procedure: ESOPHAGEAL DILATION;  Surgeon: Daneil Dolin, MD;  Location: [facility] SUITE;  Service: Endoscopy;  Laterality: N/A;  . ESOPHAGOGASTRODUODENOSCOPY  09/16/08   Dr. Trevor Iha hiatal hernia/excoriations involving the cardia and mucosa consistent with trauma, antral erosions  of linear petechiae ? gastritis versus early gastric antral vascular  ectasia.Marland Kitchen biopsy showed reactive gastropathy. No H. pylori.  . ESOPHAGOGASTRODUODENOSCOPY  09/2007   Dr. Evalee Mutton ring, dilated to 33 French Maloney dilator, small hiatal hernia, antral erosions, biopsies reactive gastropathy.  . ESOPHAGOGASTRODUODENOSCOPY (EGD) WITH PROPOFOL N/A 12/17/2013   JTT:SVXBLTJ antral erosions and petechiae. Small hiatal hernia. No endoscopic explanation for patient's symptoms  . ESOPHAGOGASTRODUODENOSCOPY (EGD) WITH PROPOFOL N/A 08/18/2015   Dr. Gala Romney: normal exam, s/p esophageal dilation  . ESOPHAGOGASTRODUODENOSCOPY (EGD) WITH PROPOFOL N/A 06/18/2019   Procedure: ESOPHAGOGASTRODUODENOSCOPY (EGD) WITH PROPOFOL;  Surgeon: Daneil Dolin, MD;  Location: [facility];  Service: Endoscopy;  Laterality: N/A;  2:30pm - office moved to 10:15  . SAVORY DILATION  07/17/2011   Fields-MAC sedation-->distal esophageal stricture s/p dilation, chronic gastritis, multiple ulcers in stomach. no h.pylori     OB History    Gravida  3   Para  2   Term  2   Preterm      AB  1   Living  2     SAB      IAB  1   Ectopic      Multiple      Live Births  2           Family History  Problem Relation Age of Onset  . Hypertension Mother   . Stroke Father        deceased at age 61  . Colon cancer Other   . Heart disease Sister   . Diabetes Other   . High Cholesterol Other   . Arthritis Other   . Anesthesia problems Neg Hx   . Hypotension Neg Hx   . Malignant hyperthermia Neg Hx   . Pseudochol  deficiency Neg Hx     Social History   Tobacco Use  . Smoking status: Current Some Day Smoker    Packs/day: 1.00    Years: 30.00    Pack years: 30.00    Types: Cigarettes    Start date: 05/18/2012  . Smokeless tobacco: Never Used  . Tobacco comment: 1 PPD  Vaping Use  . Vaping Use: Never used  Substance Use Topics  . Alcohol use: No    Alcohol/week: 0.0 standard drinks    Comment: hx of ETOH abuse  . Drug use: No    Home Medications Prior to Admission medications   Medication Sig Start Date End Date Taking? Authorizing Provider  Accu-Chek Softclix Lancets lancets Use as instructed 03/04/20   Riesa Pope, MD  Continuous Blood Gluc Receiver (FREESTYLE LIBRE 2 READER) DEVI 1 Device by Does not apply route 3 (three) times daily. 04/05/20   Jean Rosenthal, MD  Continuous Blood Gluc Sensor (FREESTYLE  LIBRE 2 SENSOR) MISC Apply sensor device to outer upper arm. 04/05/20   Agyei, Caprice Kluver, MD  esomeprazole (NEXIUM) 40 MG capsule TAKE 1 CAPSULE BY MOUTH TWICE DAILY BEFORE A MEAL FOR ACID REFLUX 02/18/20   [name], Vasilios, MD  ferrous sulfate 325 (65 FE) MG tablet Take 1 tablet (325 mg total) by mouth every other day. (May buy from over the counter): For anemia 03/04/20   [name], Vasilios, MD  glucose blood (ACCU-CHEK GUIDE) test strip USE THREE TIMES DAILY 03/04/20   [name], Vasilios, MD  insulin glargine (LANTUS) 100 UNIT/ML injection Inject 0.14 mLs (14 Units total) into the skin daily. For diabetes management 03/31/20 12/26/20  Jean Rosenthal, MD  lovastatin (MEVACOR) 40 MG tablet Take 1 tablet (40 mg total) by mouth every evening. 11/26/19 02/24/20  Jose Persia, MD  paliperidone (INVEGA SUSTENNA) 234 MG/1.5ML SUSY injection Inject 234 mg into the muscle every 30 (thirty) days. 03/03/20   Arfeen, Arlyce Harman, MD  sertraline (ZOLOFT) 100 MG tablet Take 1 tablet (100 mg total) by mouth daily. For depression 03/03/20   Arfeen, Arlyce Harman, MD  Thiamine HCl (VITAMIN B-1) 100 MG  TABS Take 1 tablet (100 mg total) by mouth daily. 06/05/19   Mercy Riding, MD  traZODone (DESYREL) 100 MG tablet Take 1 tablet (100 mg total) by mouth at bedtime as needed for sleep. 03/03/20   Arfeen, Arlyce Harman, MD    Allergies    Cephalexin, Metronidazole, Orange, Orange oil, Other, Shrimp [shellfish allergy], Penicillins, Sulfonamide derivatives, Glipizide, Ace inhibitors, Sulfamethoxazole, Sulfamethoxazole-trimethoprim, and Trimethoprim  Review of Systems   Review of Systems  Unable to perform ROS: Psychiatric disorder    Physical Exam Updated Vital Signs BP 123/88   Pulse (!) 101   Temp 98.8 F (37.1 C) (Oral)   Resp 18   SpO2 95%   Physical Exam Vitals and nursing note reviewed.  Constitutional:      General: She is not in acute distress.    Appearance: She is well-developed and well-nourished. She is not ill-appearing, toxic-appearing or diaphoretic.  HENT:     Head: Normocephalic and atraumatic.     Nose: Nose normal.  Eyes:     Pupils: Pupils are equal, round, and reactive to light.  Cardiovascular:     Rate and Rhythm: Normal rate.     Pulses: Normal pulses and intact distal pulses.     Heart sounds: Normal heart sounds.  Pulmonary:     Effort: Pulmonary effort is normal. No respiratory distress.     Breath sounds: Normal breath sounds.  Abdominal:     General: Bowel sounds are normal. There is no distension.  Musculoskeletal:        General: Normal range of motion.     Cervical back: Normal range of motion.     Comments: Moves all 4 extremities without difficulty.  Skin:    General: Skin is warm and dry.  Neurological:     General: No focal deficit present.     Mental Status: She is alert.  Psychiatric:        Attention and Perception: She is inattentive. She does not perceive auditory or visual hallucinations.        Mood and Affect: Mood is elated. Affect is labile and angry.        Speech: Speech is rapid and pressured and tangential.        Behavior:  Behavior is agitated, aggressive, hyperactive and combative.  Thought Content: Thought content is paranoid and delusional. Thought content does not include homicidal or suicidal ideation. Thought content does not include homicidal or suicidal plan.        Judgment: Judgment is impulsive.     Comments:  Inattentive.  Does not appear to be responding to hallucinations.  She has elevated and labile affect.  She is angry, threatening to harm staff.  Her speech is rapid, pressured and tangential.  Mild paranoia stating "Jesus is here."     ED Results / Procedures / Treatments   Labs (all labs ordered are listed, but only abnormal results are displayed) Labs Reviewed  COMPREHENSIVE METABOLIC PANEL - Abnormal; Notable for the following components:      Result Value   Glucose, Bld 245 (*)    Creatinine, Ser 1.27 (*)    AST 12 (*)    GFR, Estimated 51 (*)    All other components within normal limits  SALICYLATE LEVEL - Abnormal; Notable for the following components:   Salicylate Lvl <2.9 (*)    All other components within normal limits  ACETAMINOPHEN LEVEL - Abnormal; Notable for the following components:   Acetaminophen (Tylenol), Serum <10 (*)    All other components within normal limits  RESP PANEL BY RT-PCR (FLU A&B, COVID) ARPGX2  ETHANOL  CBC  RAPID URINE DRUG SCREEN, HOSP PERFORMED  I-STAT BETA HCG BLOOD, ED ([facility], [facility], [facility] ONLY)  CBG MONITORING, ED    EKG EKG Interpretation  Date/Time:  Friday April 08 2020 17:59:10 EST Ventricular Rate:  87 PR Interval:    QRS Duration: 76 QT Interval:  367 QTC Calculation: 442 R Axis:   81 Text Interpretation: Sinus rhythm Borderline short PR interval Right atrial enlargement Minimal ST depression, inferior leads No significant change since last tracing Confirmed by Wandra Arthurs [phone]) on 04/08/2020 6:02:55 PM   Radiology No results found.  Procedures .Critical Care Performed by: Nettie Elm, PA-C Authorized by:  Nettie Elm, PA-C   Critical care provider statement:    Critical care time (minutes):  35   Critical care was necessary to treat or prevent imminent or life-threatening deterioration of the following conditions: Psychosis requiring multiple doses of medication for sedation.   Critical care was time spent personally by me on the following activities:  Discussions with consultants, evaluation of patient's response to treatment, examination of patient, ordering and performing treatments and interventions, ordering and review of laboratory studies, ordering and review of radiographic studies, pulse oximetry, re-evaluation of patient's condition, obtaining history from patient or surrogate and review of old charts   (including critical care time)  Medications Ordered in ED Medications  acetaminophen (TYLENOL) tablet 650 mg (has no administration in time range)  metFORMIN (GLUCOPHAGE) tablet 1,000 mg (has no administration in time range)  ziprasidone (GEODON) injection 20 mg (20 mg Intramuscular Given 04/08/20 1648)  sterile water (preservative free) injection (10 mLs  Given 04/08/20 1651)  LORazepam (ATIVAN) injection 2 mg (2 mg Intramuscular Given 04/08/20 1748)  diphenhydrAMINE (BENADRYL) injection 50 mg (50 mg Intramuscular Given 04/08/20 1748)  insulin aspart (novoLOG) injection 2 Units (2 Units Subcutaneous Given 04/08/20 1853)   ED Course  I have reviewed the triage vital signs and the nursing notes.  Pertinent labs & imaging results that were available during my care of the patient were reviewed by me and considered in my medical decision making (see chart for details).  54 year old presents for evaluation with family for psychosis.  Multiple psychiatric hospitalizations  in the past.  Recent medication changes per sister who arrives with patient.  On arrival patient patient is hyper religious.  She has rapid and pressured speech.  Tangential thinking.  Paranoia stating "Jesus is  coming for [name]."  She is threatening staff as well as family who is present with her.  Labile elevated affect.  Appears actively psychotic despite Geodon.   Patient has needed multiple IM medications needed to protect safety of patient and staff due to psychosis.  Patient placed under ICV for safety  Labs personally reviewed and interpreted:  CBC without leukocytosis Metabolic panel with mild hyperglycemia to 45, creatinine 1.27 at baseline.  Given small bolus of insulin for hyperglycemia. Due to medicine, ethanol, salicylate within normal limits Pregnant negative  Patient medically cleared.  Pending consult with TTS.  Hold orders placed.     MDM Rules/Calculators/A&P                           Final Clinical Impression(s) / ED Diagnoses Final diagnoses:  Manic behavior ([place])  Psychosis, unspecified psychosis type [facility])    Rx / DC Orders ED Discharge Orders    None       Akshita Italiano A, PA-C 04/08/20 2116    Drenda Freeze, MD 04/08/20 2215

## 2020-04-08 NOTE — BH Assessment (Signed)
TO BE SEEN-@ 1707  attempted to complete patient's TTS assessment. Per nursing Gust Rung, RN), "Patient was medicated because she was manic and attacking staff so she will not be able to complete it at this time". Requested nursing to contact TTS when patient is alert and able to be appropriately assessed.

## 2020-04-09 ENCOUNTER — Other Ambulatory Visit: Payer: Self-pay

## 2020-04-09 ENCOUNTER — Encounter (HOSPITAL_COMMUNITY): Payer: Self-pay | Admitting: Family

## 2020-04-09 DIAGNOSIS — F309 Manic episode, unspecified: Secondary | ICD-10-CM | POA: Diagnosis not present

## 2020-04-09 DIAGNOSIS — F29 Unspecified psychosis not due to a substance or known physiological condition: Secondary | ICD-10-CM

## 2020-04-09 DIAGNOSIS — F3010 Manic episode without psychotic symptoms, unspecified: Secondary | ICD-10-CM | POA: Insufficient documentation

## 2020-04-09 LAB — CBG MONITORING, ED
Glucose-Capillary: 295 mg/dL — ABNORMAL HIGH (ref 70–99)
Glucose-Capillary: 148 mg/dL — ABNORMAL HIGH (ref 70–99)

## 2020-04-09 MED ORDER — METFORMIN HCL ER 500 MG PO TB24
1000.0000 mg | ORAL_TABLET | Freq: Every day | ORAL | Status: DC
  Administered 2020-04-09: 09:00:00 1000 mg via ORAL
  Filled 2020-04-09: qty 2

## 2020-04-09 MED ORDER — INSULIN ASPART 100 UNIT/ML ~~LOC~~ SOLN
0.0000 [IU] | Freq: Three times a day (TID) | SUBCUTANEOUS | Status: DC
  Administered 2020-04-09: 09:00:00 5 [IU] via SUBCUTANEOUS
  Filled 2020-04-09: qty 0.09

## 2020-04-09 MED ORDER — SERTRALINE HCL 50 MG PO TABS
100.0000 mg | ORAL_TABLET | Freq: Every day | ORAL | Status: DC
  Filled 2020-04-09: qty 2

## 2020-04-09 MED ORDER — PANTOPRAZOLE SODIUM 40 MG PO TBEC
40.0000 mg | DELAYED_RELEASE_TABLET | Freq: Every day | ORAL | Status: DC
  Filled 2020-04-09: qty 1

## 2020-04-09 NOTE — ED Notes (Signed)
TTS consult in room with patient

## 2020-04-09 NOTE — ED Notes (Signed)
Psych NP recommends patient staying overnight and reevaluated tomorrow. Dr. Tomi Bamberger notified of patient's status

## 2020-04-09 NOTE — ED Notes (Addendum)
Tried to do the Covid swab on the patient. She shook her head no and said "No I am going home I am not doing that." She said "if you dont let me go home I am going to the courts today."

## 2020-04-09 NOTE — ED Notes (Signed)
Pt discharged home. Discharged instructions read to pt who verbalized understanding. All belongings returned to pt. Denies SI/HI, is not delusional and not responding to internal stimuli. Escorted pt to the ED exit.   

## 2020-04-09 NOTE — ED Provider Notes (Signed)
TTS has evaluated patient and recommend psychiatrist to see in the morning.   Rolland Porter, MD 04/09/20 [phone]

## 2020-04-09 NOTE — ED Notes (Signed)
Sitter at bedside. Pt mobile at the moment.

## 2020-04-09 NOTE — ED Notes (Signed)
Pt belongings placed in cabinet behind nurses station on recess side. 3 Bags total. 2 in one cabinet, 1 bag in cabinet beside it

## 2020-04-09 NOTE — ED Notes (Signed)
The pt stated that they were ready to leave and no longer wanted to be in the hospital. I explained to the pt that they are scheduled to be reevaluated by a medical examiner. The pt did not understand why she could not leave the hospital and check herself out. Tifini Reeder/EMT explained to the pt that she was IVC'd and could not leave on her own. I noticed that the pt gathered blankets together on the bed and covered them to make a "body-like" shape. I assumed that the pt was trying to make it look as though she was laying in bed, when there was not a body occupying the bed. -Caryl Pina S./EMT (preceptee)   Note documented under Eddrick Dilone/EMT Production assistant, radio)

## 2020-04-09 NOTE — ED Provider Notes (Signed)
Patient cleared by psych and discharged.   Lennice Sites, DO 04/09/20 1240

## 2020-04-09 NOTE — Consult Note (Signed)
[name]   Reason for [name]:  Manic Behavioral  Referring Physician: EPD Patient Identification: [name] MRN:  [id number] Principal Diagnosis: <principal problem not specified> Diagnosis:  Active Problems:   * No active hospital problems. *   Total Time spent with patient: 15 minutes  Subjective:   [name] is a 54 y.o. female was seen and evaluated face-to-face. [name] observed sitting in bed.  She presents pleasant, calm and cooperative.  She denies suicidal or homicidal ideations.  Denies auditory or visual hallucinations.  Patient denied paranoia.stated "I just wanted to go outside to smoke a cigarette ." per involuntary commitment patient was" acting manic" stated worsening manic behavior and paranoia ideations.  NP spoke to patient's mother [name] at [phone] for additional collateral.  Mother had concerns that [name]'s behavior worsened after patient received Covid vaccine in addition to [place].  States she is attempted to reach out to patient psychiatrist without resolution.  She reports considering to follow back up with [facility].  Education provided with mental health power of attorney/ HIPPA.  Mother was receptive to plan.  Case staffed with attending psychiatrist Dwyane Dee.  Support, encouragement and reassurance was provided.  HPI: Per admission assessment note: Clinician reviewed note by [name], PA.  Arrived with sister provides majority of history. States patient has recently had some medication changes. Has been having frequent outbursts at home. Over the last few days she has been hyper religious, threatening, pressured, rapid speech. Has been inpatient multiple times previously. She does not do any illicit drugs. She no longer alcohol.Patient denies any SI, HI. On arrival patient with rapid, pressured speech. Hyperreligious. Screaming, threatening staff as well as family member. She throws her hands up in the air  and is trying to run stating " Jesus is near" "It is all ending"  Past Psychiatric History:   Risk to Self:   Risk to Others:   Prior Inpatient Therapy:   Prior Outpatient Therapy:    Past Medical History:  Past Medical History:  Diagnosis Date   Alcohol abuse    Anemia, iron deficiency    Arteriosclerotic cardiovascular disease (ASCVD)    Minimal at cath in 07;negative pharm.stress nuclear study in 8/08 with nl EF; neg stress echo in 2010   Community acquired pneumonia 01/03/10, 05/2010, 04/2012   2011; with pleural effusion-[facility] acute resp failure; intubated in Jan 2014 (HMPV pneumonia)   Depression    Diabetes mellitus, type 2 ([place]) 2000   Onset in 2000; no insulin   Diarrhea 10/14/2013   Started 10/10/13, improved with Imodium.    Diastolic dysfunction    grade 2 per echo 2011   Dysphagia    Gastroesophageal reflux disease    Schatzki's ring   History of alcohol abuse 07/22/2007   Qualifier: Diagnosis of  By: Lenn Cal     Hyperlipidemia    Hypertension `   during treatment with [facility]   Hypokalemia 12/25/2013   Left knee pain 08/25/2014   Obesity    Oral candidiasis 05/16/2017   PTSD (post-traumatic stress disorder)    Pulmonary hypertension ([place]) 05/02/2012   Patient needs repeat echo in 06/2012    Schizoaffective disorder    requiring multiple psychiatric admissions   Viral URI 05/12/2013   Viral wart on finger 10/08/2016   Vision changes 08/12/2014    Past Surgical History:  Procedure Laterality Date   COLONOSCOPY  01/2006   internal hemorrhoids   COLONOSCOPY  01/10/2012   Dr. Malva Limes  anal canal hemorrhoidal tag likely source of  trivial hematochezia; right-sided colonic diverticulosis   DILATION AND CURETTAGE, DIAGNOSTIC / THERAPEUTIC  1992   ESOPHAGEAL DILATION N/A 08/18/2015   Procedure: ESOPHAGEAL DILATION;  Surgeon: Daneil Dolin, MD;  [facility];  Service: Endoscopy;  Laterality: N/A;    ESOPHAGOGASTRODUODENOSCOPY  09/16/08   Dr. Trevor Iha hiatal hernia/excoriations involving the cardia and mucosa consistent with trauma, antral erosions  of linear petechiae ? gastritis versus early gastric antral vascular  ectasia.Marland Kitchen biopsy showed reactive gastropathy. No H. pylori.   ESOPHAGOGASTRODUODENOSCOPY  09/2007   Dr. Evalee Mutton ring, dilated to 22 French Maloney dilator, small hiatal hernia, antral erosions, biopsies reactive gastropathy.   ESOPHAGOGASTRODUODENOSCOPY (EGD) WITH PROPOFOL N/A 12/17/2013   LGX:[id number] antral erosions and petechiae. Small hiatal hernia. No endoscopic explanation for patient's symptoms   ESOPHAGOGASTRODUODENOSCOPY (EGD) WITH PROPOFOL N/A 08/18/2015   Dr. Gala Romney: normal exam, s/p esophageal dilation   ESOPHAGOGASTRODUODENOSCOPY (EGD) WITH PROPOFOL N/A 06/18/2019   Procedure: ESOPHAGOGASTRODUODENOSCOPY (EGD) WITH PROPOFOL;  Surgeon: Daneil Dolin, MD;  [facility]: [facility];  Service: Endoscopy;  Laterality: N/A;  2:30pm - office moved to 10:15   SAVORY DILATION  07/17/2011   [facility] sedation-->distal esophageal stricture s/p dilation, chronic gastritis, multiple ulcers in stomach. no h.pylori   Family History:  Family History  Problem Relation Age of Onset   Hypertension Mother    Stroke Father        deceased at age 68   Colon cancer Other    Heart disease Sister    Diabetes Other    High Cholesterol Other    Arthritis Other    Anesthesia problems Neg Hx    Hypotension Neg Hx    Malignant hyperthermia Neg Hx    Pseudochol deficiency Neg Hx    Family Psychiatric  History:  Social History:  Social History   Substance and Sexual Activity  Alcohol Use No   Alcohol/week: 0.0 standard drinks   Comment: hx of ETOH abuse     Social History   Substance and Sexual Activity  Drug Use No    Social History   Socioeconomic History   Marital status: Single    Spouse name: Not on file   Number of children: 2   Years of  education: some colge   Highest education level: Not on file  Occupational History   Occupation: Disability   Occupation: UNEMPLOYED    Fish farm manager: UNEMPLOYED  Tobacco Use   Smoking status: Current Some Day Smoker    Packs/day: 1.00    Years: 30.00    Pack years: 30.00    Types: Cigarettes    Start date: 05/18/2012   Smokeless tobacco: Never Used   Tobacco comment: 1 PPD  Vaping Use   Vaping Use: Never used  Substance and Sexual Activity   Alcohol use: No    Alcohol/week: 0.0 standard drinks    Comment: hx of ETOH abuse   Drug use: No   Sexual activity: Not Currently    Partners: Male    Birth control/protection: None  Other Topics Concern   Not on file  Social History Narrative   Live alone, no animals in the house; 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, has interviewed for job again (08/16/11); On disability (depression qualifies); Graduated high school in [place] and some community college in [place]   Caffeine use: 2 sodas per day   Social Determinants of Radio broadcast assistant Strain: Not on [employer]  Insecurity: Not on file  Transportation Needs: No Transportation Needs   Lack of Transportation (Medical): No   Lack of Transportation (Non-Medical): No  Physical Activity: Not on file  Stress: Not on file  Social Connections: Unknown   Frequency of Communication with Friends and Family: More than three times a week   Frequency of Social Gatherings with Friends and Family: Three times a week   Attends Religious Services: Not on file   Active Member of Clubs or Organizations: Not on file   Attends Club or Organization Meetings: Not on file   Marital Status: Not on file   Additional Social History:    Allergies:   Allergies  Allergen Reactions   Cephalexin     Trouble breathing, felt like she had bumps in her throat.   Metronidazole Shortness Of Breath and Swelling   Orange Itching   Orange Oil Hives and Other (See  Comments)   Other Shortness Of Breath and Itching    Takes Benadryl before eating shrimp.   Shrimp [Shellfish Allergy] Shortness Of Breath and Itching    Takes Benadryl before eating shrimp.   Penicillins Hives, Swelling and Other (See Comments)    Has patient had a PCN reaction causing immediate rash, facial/tongue/throat swelling, SOB or lightheadedness with hypotension: Yes Has patient had a PCN reaction causing severe rash involving mucus membranes or skin necrosis: No Has patient had a PCN reaction that required hospitalization No Has patient had a PCN reaction occurring within the last 10 years: Yes If all of the above answers are "NO", then may proceed with Cephalosporin use.    Fever as well   Sulfonamide Derivatives Hives    fever   Glipizide Other (See Comments)    psychosis   Ace Inhibitors Cough    06/2016   Sulfamethoxazole Rash   Sulfamethoxazole-Trimethoprim Rash   Trimethoprim Other (See Comments) and Rash    Labs:  Results for orders placed or performed during the hospital encounter of 04/08/20 (from the past 48 hour(s))  Comprehensive metabolic panel     Status: Abnormal   Collection Time: 04/08/20  4:00 PM  Result Value Ref Range   Sodium 138 135 - 145 mmol/L   Potassium 4.0 3.5 - 5.1 mmol/L   Chloride 106 98 - 111 mmol/L   CO2 23 22 - 32 mmol/L   Glucose, Bld 245 (H) 70 - 99 mg/dL    Comment: Glucose reference range applies only to samples taken after fasting for at least 8 hours.   BUN 19 6 - 20 mg/dL   Creatinine, Ser 1.27 (H) 0.44 - 1.00 mg/dL   Calcium 9.4 8.9 - 10.3 mg/dL   Total Protein 6.9 6.5 - 8.1 g/dL   Albumin 4.1 3.5 - 5.0 g/dL   AST 12 (L) 15 - 41 U/L   ALT 10 0 - 44 U/L   Alkaline Phosphatase 73 38 - 126 U/L   Total Bilirubin 0.5 0.3 - 1.2 mg/dL   GFR, Estimated 51 (L) >60 mL/min    Comment: (NOTE) Calculated using the CKD-EPI Creatinine Equation (2021)    Anion gap 9 5 - 15    Comment: Performed at [facility], [place] [address], [place], [place] [address]  Ethanol     Status: None   Collection Time: 04/08/20  4:00 PM  Result Value Ref Range   Alcohol, Ethyl (B) <10 <10 mg/dL    Comment: (NOTE) Lowest detectable limit for serum alcohol is 10 mg/dL.  For  medical purposes only. Performed at [facility], [place] [address], [place], [place] [address]   Salicylate level     Status: Abnormal   Collection Time: 04/08/20  4:00 PM  Result Value Ref Range   Salicylate Lvl <6.0 (L) 7.0 - 30.0 mg/dL    Comment: Performed at [facility], [place] [address] [place], [place] [address]  Acetaminophen level     Status: Abnormal   Collection Time: 04/08/20  4:00 PM  Result Value Ref Range   Acetaminophen (Tylenol), Serum <10 (L) 10 - 30 ug/mL    Comment: (NOTE) Therapeutic concentrations vary significantly. A range of 10-30 ug/mL  may be an effective concentration for many patients. However, some  are best treated at concentrations outside of this range. Acetaminophen concentrations >150 ug/mL at 4 hours after ingestion  and >50 ug/mL at 12 hours after ingestion are often associated with  toxic reactions.  Performed at [facility], [place] [address], [place], [place] [address]   cbc     Status: None   Collection Time: 04/08/20  4:00 PM  Result Value Ref Range   WBC 6.8 4.0 - 10.5 K/uL   RBC 4.41 3.87 - 5.11 MIL/uL   Hemoglobin 12.9 12.0 - 15.0 g/dL   HCT 39.1 36.0 - 46.0 %   MCV 88.7 80.0 - 100.0 fL   MCH 29.3 26.0 - 34.0 pg   MCHC 33.0 30.0 - 36.0 g/dL   RDW 15.5 11.5 - 15.5 %   Platelets 184 150 - 400 K/uL   nRBC 0.0 0.0 - 0.2 %    Comment: Performed at [facility], [place] [address] [place], [place] [address]  I-Stat beta hCG blood, ED     Status: None   Collection Time: 04/08/20  6:07 PM  Result Value Ref Range   I-stat hCG, quantitative <5.0 <5 mIU/mL   Comment 3            Comment:   GEST. AGE      CONC.   (mIU/mL)   <=1 WEEK        5 - 50     2 WEEKS       50 - 500     3 WEEKS       100 - 10,000     4 WEEKS     1,000 - 30,000        FEMALE AND NON-PREGNANT FEMALE:     LESS THAN 5 mIU/mL   POC CBG, ED     Status: Abnormal   Collection Time: 04/08/20 11:57 PM  Result Value Ref Range   Glucose-Capillary 113 (H) 70 - 99 mg/dL    Comment: Glucose reference range applies only to samples taken after fasting for at least 8 hours.  POC CBG, ED     Status: Abnormal   Collection Time: 04/09/20  7:43 AM  Result Value Ref Range   Glucose-Capillary 295 (H) 70 - 99 mg/dL    Comment: Glucose reference range applies only to samples taken after fasting for at least 8 hours.   Comment 1 Notify RN    Comment 2 Document in Chart    *Note: Due to a large number of results and/or encounters for the requested time period, some results have not been displayed. A complete set of results can be found in Results Review.    Current Facility-Administered Medications  Medication Dose Route Frequency Provider Last Rate Last Admin   acetaminophen (  TYLENOL) tablet 650 mg  650 mg Oral Q4H PRN Henderly, Britni A, PA-C       insulin aspart (novoLOG) injection 0-9 Units  0-9 Units Subcutaneous TID WC Curatolo, Adam, DO   5 Units at 04/09/20 0844   metFORMIN (GLUCOPHAGE-XR) 24 hr tablet 1,000 mg  1,000 mg Oral q morning - 10a [name], Vasilios, MD       metFORMIN (GLUCOPHAGE-XR) 24 hr tablet 1,000 mg  1,000 mg Oral Q breakfast Curatolo, Adam, DO   1,000 mg at 04/09/20 0845   paliperidone (INVEGA SUSTENNA) injection 234 mg  234 mg Intramuscular Q28 days Arfeen, Arlyce Harman, MD   234 mg at 04/06/20 1116   pantoprazole (PROTONIX) EC tablet 40 mg  40 mg Oral Daily Curatolo, Adam, DO       sertraline (ZOLOFT) tablet 100 mg  100 mg Oral Daily Curatolo, Adam, DO       Current Outpatient Medications  Medication Sig Dispense Refill   acetaminophen (TYLENOL) 500 MG tablet Take 1,000 mg by mouth every 6 (six) hours as needed  for mild pain.     esomeprazole (NEXIUM) 40 MG capsule TAKE 1 CAPSULE BY MOUTH TWICE DAILY BEFORE A MEAL FOR ACID REFLUX (Patient taking differently: Take 40 mg by mouth 2 (two) times daily before a meal. TAKE 1 CAPSULE BY MOUTH TWICE DAILY BEFORE A MEAL FOR ACID REFLUX) 180 capsule 2   ferrous sulfate 325 (65 FE) MG tablet Take 1 tablet (325 mg total) by mouth every other day. (May buy from over the counter): For anemia 15 tablet 2   insulin glargine (LANTUS) 100 UNIT/ML injection Inject 0.14 mLs (14 Units total) into the skin daily. For diabetes management 12.6 mL 2   paliperidone (INVEGA SUSTENNA) 234 MG/1.5ML SUSY injection Inject 234 mg into the muscle every 30 (thirty) days. 1.5 mL 1   sertraline (ZOLOFT) 100 MG tablet Take 1 tablet (100 mg total) by mouth daily. For depression 30 tablet 1   Thiamine HCl (VITAMIN B-1) 100 MG TABS Take 1 tablet (100 mg total) by mouth daily. 90 tablet 1   Accu-Chek Softclix Lancets lancets Use as instructed 400 each 12   Continuous Blood Gluc Receiver (FREESTYLE LIBRE 2 READER) DEVI 1 Device by Does not apply route 3 (three) times daily. 1 each 0   Continuous Blood Gluc Sensor (FREESTYLE LIBRE 2 SENSOR) MISC Apply sensor device to outer upper arm. 1 each 3   glucose blood (ACCU-CHEK GUIDE) test strip USE THREE TIMES DAILY 100 strip 5   lovastatin (MEVACOR) 40 MG tablet Take 1 tablet (40 mg total) by mouth every evening. 90 tablet 0   metoprolol succinate (TOPROL-XL) 100 MG 24 hr tablet Take 100 mg by mouth daily. (Patient not taking: No sig reported)     traZODone (DESYREL) 100 MG tablet Take 1 tablet (100 mg total) by mouth at bedtime as needed for sleep. (Patient not taking: No sig reported) 30 tablet 0    Musculoskeletal: Strength & Muscle Tone: within normal limits Gait & Station: normal Patient leans: N/A  Psychiatric Specialty Exam: Physical Exam Vitals reviewed.  Psychiatric:        Mood and Affect: Mood normal.        Thought  Content: Thought content normal.     Review of Systems  Psychiatric/Behavioral: Negative for suicidal ideas.  All other systems reviewed and are negative.   Blood pressure 123/88, pulse 100, temperature 98 F (36.7 C), temperature source Oral, resp. rate 15, SpO2 98 %.  There is no height or weight on file to calculate BMI.  General Appearance: Casual  Eye Contact:  Good  Speech:  Clear and Coherent  Volume:  Normal  Mood:  Anxious  Affect:  Congruent  Thought Process:  Coherent  Orientation:  Full (Time, Place, and Person)  Thought Content:  Logical  Suicidal Thoughts:  No  Homicidal Thoughts:  No  Memory:  Immediate;   Fair Recent;   Fair  Judgement:  Fair  Insight:  Fair  Psychomotor Activity:  Normal  Concentration:  Concentration: Fair  Recall:  [employer] of Knowledge:  Fair  Language:  Fair  Akathisia:  No  Handed:  Right  AIMS (if indicated):     Assets:  Communication Skills Desire for Improvement Resilience Social Support  ADL's:  Intact  Cognition:  WNL  Sleep:       Keep follow-up appointment  with Outpatient Services   Disposition: No evidence of imminent risk to self or others at present.   Patient does not meet criteria for psychiatric inpatient admission. Supportive therapy provided about ongoing stressors. Refer to IOP. Discussed crisis plan, support from social network, calling 911, coming to the Emergency Department, and calling Suicide Hotline.  Derrill [place], NP 04/09/2020 11:28 AM

## 2020-04-09 NOTE — ED Notes (Signed)
Calm and cooperative, appropriate. Denies SI/HI/AVH.

## 2020-04-10 ENCOUNTER — Encounter (HOSPITAL_COMMUNITY): Payer: Self-pay | Admitting: Emergency Medicine

## 2020-04-10 ENCOUNTER — Emergency Department (HOSPITAL_COMMUNITY)
Admission: EM | Admit: 2020-04-10 | Discharge: 2020-04-12 | Disposition: A | Discharge status: Home / Self Care | Payer: Medicare Other | Attending: Emergency Medicine | Admitting: Emergency Medicine

## 2020-04-10 DIAGNOSIS — R440 Auditory hallucinations: Secondary | ICD-10-CM | POA: Diagnosis present

## 2020-04-10 DIAGNOSIS — J449 Chronic obstructive pulmonary disease, unspecified: Secondary | ICD-10-CM | POA: Insufficient documentation

## 2020-04-10 DIAGNOSIS — F17210 Nicotine dependence, cigarettes, uncomplicated: Secondary | ICD-10-CM | POA: Diagnosis not present

## 2020-04-10 DIAGNOSIS — Z79899 Other long term (current) drug therapy: Secondary | ICD-10-CM | POA: Diagnosis not present

## 2020-04-10 DIAGNOSIS — I110 Hypertensive heart disease with heart failure: Secondary | ICD-10-CM | POA: Diagnosis not present

## 2020-04-10 DIAGNOSIS — E113291 Type 2 diabetes mellitus with mild nonproliferative diabetic retinopathy without macular edema, right eye: Secondary | ICD-10-CM | POA: Insufficient documentation

## 2020-04-10 DIAGNOSIS — F29 Unspecified psychosis not due to a substance or known physiological condition: Secondary | ICD-10-CM | POA: Insufficient documentation

## 2020-04-10 DIAGNOSIS — F250 Schizoaffective disorder, bipolar type: Secondary | ICD-10-CM | POA: Diagnosis not present

## 2020-04-10 DIAGNOSIS — I5030 Unspecified diastolic (congestive) heart failure: Secondary | ICD-10-CM | POA: Diagnosis not present

## 2020-04-10 DIAGNOSIS — R45851 Suicidal ideations: Secondary | ICD-10-CM | POA: Diagnosis not present

## 2020-04-10 DIAGNOSIS — Z20822 Contact with and (suspected) exposure to covid-19: Secondary | ICD-10-CM | POA: Insufficient documentation

## 2020-04-10 DIAGNOSIS — Z794 Long term (current) use of insulin: Secondary | ICD-10-CM | POA: Diagnosis not present

## 2020-04-10 LAB — COMPREHENSIVE METABOLIC PANEL
ALT: 12 U/L (ref 0–44)
AST: 29 U/L (ref 15–41)
Albumin: 3.9 g/dL (ref 3.5–5.0)
Alkaline Phosphatase: 71 U/L (ref 38–126)
Anion gap: 9 (ref 5–15)
BUN: 27 mg/dL — ABNORMAL HIGH (ref 6–20)
CO2: 21 mmol/L — ABNORMAL LOW (ref 22–32)
Calcium: 9.1 mg/dL (ref 8.9–10.3)
Chloride: 106 mmol/L (ref 98–111)
Creatinine, Ser: 1.17 mg/dL — ABNORMAL HIGH (ref 0.44–1.00)
GFR, Estimated: 56 mL/min — ABNORMAL LOW (ref >60)
Glucose, Bld: 259 mg/dL — ABNORMAL HIGH (ref 70–99)
Potassium: 3.5 mmol/L (ref 3.5–5.1)
Sodium: 136 mmol/L (ref 135–145)
Total Bilirubin: 0.4 mg/dL (ref 0.3–1.2)
Total Protein: 6.5 g/dL (ref 6.5–8.1)

## 2020-04-10 LAB — CBC WITH DIFFERENTIAL/PLATELET
Abs Immature Granulocytes: 0.02 10*3/uL (ref 0.00–0.07)
Basophils Absolute: 0 10*3/uL (ref 0.0–0.1)
Basophils Relative: 0 %
Eosinophils Absolute: 0.1 10*3/uL (ref 0.0–0.5)
Eosinophils Relative: 1 %
HCT: 36 % (ref 36.0–46.0)
Hemoglobin: 11.4 g/dL — ABNORMAL LOW (ref 12.0–15.0)
Immature Granulocytes: 0 %
Lymphocytes Relative: 28 %
Lymphs Abs: 2.4 10*3/uL (ref 0.7–4.0)
MCH: 28.6 pg (ref 26.0–34.0)
MCHC: 31.7 g/dL (ref 30.0–36.0)
MCV: 90.5 fL (ref 80.0–100.0)
Monocytes Absolute: 0.6 10*3/uL (ref 0.1–1.0)
Monocytes Relative: 7 %
Neutro Abs: 5.3 10*3/uL (ref 1.7–7.7)
Neutrophils Relative %: 64 %
Platelets: 164 10*3/uL (ref 150–400)
RBC: 3.98 MIL/uL (ref 3.87–5.11)
RDW: 15.5 % (ref 11.5–15.5)
WBC: 8.4 10*3/uL (ref 4.0–10.5)
nRBC: 0 % (ref 0.0–0.2)

## 2020-04-10 LAB — ETHANOL: Alcohol, Ethyl (B): <10 mg/dL (ref <10)

## 2020-04-10 LAB — RESP PANEL BY RT-PCR (FLU A&B, COVID) ARPGX2
Influenza A by PCR: NEGATIVE
Influenza B by PCR: NEGATIVE
SARS Coronavirus 2 by RT PCR: NEGATIVE

## 2020-04-10 LAB — I-STAT BETA HCG BLOOD, ED (MC, WL, AP ONLY): I-stat hCG, quantitative: <5 m[IU]/mL (ref <5)

## 2020-04-10 MED ORDER — ZIPRASIDONE MESYLATE 20 MG IM SOLR
20.0000 mg | Freq: Once | INTRAMUSCULAR | Status: AC
  Administered 2020-04-10: 22:00:00 20 mg via INTRAMUSCULAR
  Filled 2020-04-10: qty 20

## 2020-04-10 MED ORDER — STERILE WATER FOR INJECTION IJ SOLN
INTRAMUSCULAR | Status: AC
  Filled 2020-04-10: qty 10

## 2020-04-10 NOTE — ED Provider Notes (Signed)
[facility] DEPT Provider Note   CSN: [id number] Arrival date & time: 04/10/20  2131     History Chief Complaint  Patient presents with   IVC   Suicidal   Hallucinations    [name] is a 54 y.o. female.  Patient is a 54 year old female who presents on IVC papers.  The IVC papers were taken out by her mom.  Patient has a history of schizophrenia mom says that she is not taking her medications.  She reportedly has had some violent outburst at home and has been aching threats of killing herself.  When brought into the ED, she was initially very aggressive and yelling.  When I tried to talk to her, she would not answer questions.  When I asked her if she was hurting anywhere, she shook her head no.  When asked if anything was bothering her, she shook her head no but otherwise would not answer questions.  She was seen here 2 days ago and actually discharged yesterday after she was cleared by psychiatry.        Past Medical History:  Diagnosis Date   Alcohol abuse    Anemia, iron deficiency    Arteriosclerotic cardiovascular disease (ASCVD)    Minimal at cath in 07;negative pharm.stress nuclear study in 8/08 with nl EF; neg stress echo in 2010   Community acquired pneumonia 01/03/10, 05/2010, 04/2012   2011; with pleural effusion-hosp [facility] acute resp failure; intubated in Jan 2014 (HMPV pneumonia)   Depression    Diabetes mellitus, type 2 ([place]) 2000   Onset in 2000; no insulin   Diarrhea 10/14/2013   Started 10/10/13, improved with Imodium.    Diastolic dysfunction    grade 2 per echo 2011   Dysphagia    Gastroesophageal reflux disease    Schatzki's ring   History of alcohol abuse 07/22/2007   Qualifier: Diagnosis of  By: Lenn Cal     Hyperlipidemia    Hypertension `   during treatment with Geodon   Hypokalemia 12/25/2013   Left knee pain 08/25/2014   Obesity    Oral candidiasis 05/16/2017   PTSD (post-traumatic  stress disorder)    Pulmonary hypertension ([place]) 05/02/2012   Patient needs repeat echo in 06/2012    Schizoaffective disorder    requiring multiple psychiatric admissions   Viral URI 05/12/2013   Viral wart on finger 10/08/2016   Vision changes 08/12/2014    Patient Active Problem List   Diagnosis Date Noted   Manic behavior ([place])    Psychosis ([place])    Weight loss 06/15/2019   Yeast infection 06/11/2019   Lithium toxicity 06/03/2019   Warts 02/19/2019   Bipolar I disorder, most recent episode (or current) manic ([place]) 09/14/2018   Schizoaffective disorder, bipolar type ([place]) 08/26/2017   Healthcare maintenance 04/17/2017   Nuclear sclerosis of both eyes 04/02/2016   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 without macular edema, with long-term current use of insulin ([place]) 04/02/2016   Encounter for health education 10/24/2014   IIH (idiopathic intracranial hypertension) 08/12/2014   Seasonal allergies 02/23/2014   Daytime hypersomnolence 12/09/2012   Insomnia 11/06/2012   COPD (chronic obstructive pulmonary disease) ([place]) 10/20/2012   GERD (gastroesophageal reflux disease) 10/15/2012   Preventative health care 10/15/2012   Pulmonary hypertension ([place]) 25/42/7062   Diastolic heart failure ([place]) 02/07/2012   IDA (iron deficiency anemia) 11/06/2011   Nausea & vomiting 07/06/2011   Fatigue 05/17/2011  Arteriosclerotic cardiovascular disease (ASCVD)    TOBACCO ABUSE 09/27/2009   Back pain 12/13/2008   Hyperlipidemia 07/22/2007   Obesity 07/22/2007   Resistant hypertension 07/22/2007   Diabetes mellitus type 2, controlled ([place]) 09/28/2002    Past Surgical History:  Procedure Laterality Date   COLONOSCOPY  01/2006   internal hemorrhoids   COLONOSCOPY  01/10/2012   Dr. Rourk:Single anal canal hemorrhoidal tag likely source of  trivial hematochezia; right-sided colonic diverticulosis   DILATION  AND CURETTAGE, DIAGNOSTIC / THERAPEUTIC  1992   ESOPHAGEAL DILATION N/A 08/18/2015   Procedure: ESOPHAGEAL DILATION;  Surgeon: Daneil Dolin, MD;  Location: [facility];  Service: Endoscopy;  Laterality: N/A;   ESOPHAGOGASTRODUODENOSCOPY  09/16/08   Dr. Trevor Iha hiatal hernia/excoriations involving the cardia and mucosa consistent with trauma, antral erosions  of linear petechiae ? gastritis versus early gastric antral vascular  ectasia.Marland Kitchen biopsy showed reactive gastropathy. No H. pylori.   ESOPHAGOGASTRODUODENOSCOPY  09/2007   Dr. Evalee Mutton ring, dilated to 79 French Maloney dilator, small hiatal hernia, antral erosions, biopsies reactive gastropathy.   ESOPHAGOGASTRODUODENOSCOPY (EGD) WITH PROPOFOL N/A 12/17/2013   OZD:[id number] antral erosions and petechiae. Small hiatal hernia. No endoscopic explanation for patient's symptoms   ESOPHAGOGASTRODUODENOSCOPY (EGD) WITH PROPOFOL N/A 08/18/2015   Dr. Gala Romney: normal exam, s/p esophageal dilation   ESOPHAGOGASTRODUODENOSCOPY (EGD) WITH PROPOFOL N/A 06/18/2019   Procedure: ESOPHAGOGASTRODUODENOSCOPY (EGD) WITH PROPOFOL;  Surgeon: Daneil Dolin, MD;  Location: [facility];  Service: Endoscopy;  Laterality: N/A;  2:30pm - office moved to 10:15   SAVORY DILATION  07/17/2011   [facility] sedation-->distal esophageal stricture s/p dilation, chronic gastritis, multiple ulcers in stomach. no h.pylori     OB History    Gravida  3   Para  2   Term  2   Preterm      AB  1   Living  2     SAB      IAB  1   Ectopic      Multiple      Live Births  2           Family History  Problem Relation Age of Onset   Hypertension Mother    Stroke Father        deceased at age 58   Colon cancer Other    Heart disease Sister    Diabetes Other    High Cholesterol Other    Arthritis Other    Anesthesia problems Neg Hx    Hypotension Neg Hx    Malignant hyperthermia Neg Hx    Pseudochol deficiency Neg Hx     Social  History   Tobacco Use   Smoking status: Current Some Day Smoker    Packs/day: 1.00    Years: 30.00    Pack years: 30.00    Types: Cigarettes    Start date: 05/18/2012   Smokeless tobacco: Never Used   Tobacco comment: 1 PPD  Vaping Use   Vaping Use: Never used  Substance Use Topics   Alcohol use: No    Alcohol/week: 0.0 standard drinks    Comment: hx of ETOH abuse   Drug use: No    Home Medications Prior to Admission medications   Medication Sig Start Date End Date Taking? Authorizing Provider  Accu-Chek Softclix Lancets lancets Use as instructed 03/04/20   Riesa Pope, MD  acetaminophen (TYLENOL) 500 MG tablet Take 1,000 mg by mouth every 6 (six) hours as needed for mild pain.    [provider]  Continuous Blood Gluc Receiver (FREESTYLE LIBRE 2 READER) DEVI 1 Device by Does not apply route 3 (three) times daily. 04/05/20   Jean Rosenthal, MD  Continuous Blood Gluc Sensor (FREESTYLE LIBRE 2 SENSOR) MISC Apply sensor device to outer upper arm. 04/05/20   Agyei, Caprice Kluver, MD  esomeprazole (NEXIUM) 40 MG capsule TAKE 1 CAPSULE BY MOUTH TWICE DAILY BEFORE A MEAL FOR ACID REFLUX Patient taking differently: Take 40 mg by mouth 2 (two) times daily before a meal. TAKE 1 CAPSULE BY MOUTH TWICE DAILY BEFORE A MEAL FOR ACID REFLUX 02/18/20   [name], Vasilios, MD  ferrous sulfate 325 (65 FE) MG tablet Take 1 tablet (325 mg total) by mouth every other day. (May buy from over the counter): For anemia 03/04/20   [name], Vasilios, MD  glucose blood (ACCU-CHEK GUIDE) test strip USE THREE TIMES DAILY 03/04/20   [name], Vasilios, MD  insulin glargine (LANTUS) 100 UNIT/ML injection Inject 0.14 mLs (14 Units total) into the skin daily. For diabetes management 03/31/20 12/26/20  Jean Rosenthal, MD  lovastatin (MEVACOR) 40 MG tablet Take 1 tablet (40 mg total) by mouth every evening. 11/26/19 02/24/20  Jose Persia, MD  metoprolol succinate (TOPROL-XL) 100 MG 24 hr tablet  Take 100 mg by mouth daily. Patient not taking: No sig reported    [provider]  paliperidone (INVEGA SUSTENNA) 234 MG/1.5ML SUSY injection Inject 234 mg into the muscle every 30 (thirty) days. 03/03/20   Arfeen, Arlyce Harman, MD  sertraline (ZOLOFT) 100 MG tablet Take 1 tablet (100 mg total) by mouth daily. For depression 03/03/20   Arfeen, Arlyce Harman, MD  Thiamine HCl (VITAMIN B-1) 100 MG TABS Take 1 tablet (100 mg total) by mouth daily. 06/05/19   Mercy Riding, MD  traZODone (DESYREL) 100 MG tablet Take 1 tablet (100 mg total) by mouth at bedtime as needed for sleep. Patient not taking: No sig reported 03/03/20   Arfeen, Arlyce Harman, MD    Allergies    Cephalexin, Metronidazole, Orange, Orange oil, Other, Shrimp [shellfish allergy], Penicillins, Sulfonamide derivatives, Glipizide, Ace inhibitors, Sulfamethoxazole, Sulfamethoxazole-trimethoprim, and Trimethoprim  Review of Systems   Review of Systems  Unable to perform ROS: Psychiatric disorder    Physical Exam Updated Vital Signs BP (!) 145/91    Pulse (!) 112    Temp 98.4 F (36.9 C) (Oral)    Resp 15    SpO2 100%   Physical Exam Constitutional:      Appearance: She is well-developed and well-nourished.  HENT:     Head: Normocephalic and atraumatic.  Eyes:     Pupils: Pupils are equal, round, and reactive to light.  Cardiovascular:     Rate and Rhythm: Regular rhythm. Tachycardia present.     Heart sounds: Normal heart sounds.  Pulmonary:     Effort: Pulmonary effort is normal. No respiratory distress.     Breath sounds: Normal breath sounds. No wheezing or rales.  Chest:     Chest wall: No tenderness.  Abdominal:     General: Bowel sounds are normal.     Palpations: Abdomen is soft.     Tenderness: There is no abdominal tenderness. There is no guarding or rebound.  Musculoskeletal:        General: No edema. Normal range of motion.     Cervical back: Normal range of motion and neck supple.  Lymphadenopathy:     Cervical:  No cervical adenopathy.  Skin:    General: Skin is  warm and dry.     Findings: No rash.  Neurological:     Mental Status: She is alert.     Comments: Patient would not answer questions, she is awake and alert and will nod her head to some simple questions.  She moves all extremities symmetrically without focal deficits.  Psychiatric:        Mood and Affect: Mood and affect normal.     ED Results / Procedures / Treatments   Labs (all labs ordered are listed, but only abnormal results are displayed) Labs Reviewed  RESP PANEL BY RT-PCR (FLU A&B, COVID) [id number]  COMPREHENSIVE METABOLIC PANEL  ETHANOL  RAPID URINE DRUG SCREEN, HOSP PERFORMED  CBC WITH DIFFERENTIAL/PLATELET  I-STAT BETA HCG BLOOD, ED ([facility], [facility], [facility] ONLY)    EKG EKG Interpretation  Date/Time:  Sunday April 10 2020 22:11:10 EST Ventricular Rate:  113 PR Interval:    QRS Duration: 79 QT Interval:  342 QTC Calculation: 469 R Axis:   72 Text Interpretation: Sinus tachycardia Biatrial enlargement Probable anterior infarct, old SINCE LAST TRACING HEART RATE HAS INCREASED Confirmed by Malvin Johns [id number]) on 04/10/2020 10:15:59 PM   Radiology No results found.  Procedures Procedures (including critical care time)  Medications Ordered in ED Medications  sterile water (preservative free) injection (has no administration in time range)  ziprasidone (GEODON) injection 20 mg (20 mg Intramuscular Given 04/10/20 2213)    ED Course  I have reviewed the triage vital signs and the nursing notes.  Pertinent labs & imaging results that were available during my care of the patient were reviewed by me and considered in my medical decision making (see chart for details).    MDM Rules/Calculators/A&P                          Patient is a 54 year old female who presents with aggressive behavior and reportedly had suicidal ideations at home. She is under IVC. She will not communicate with me at this point other than  occasionally nodding her head to some of my questions. She is mildly tachycardic. She otherwise is well-appearing. She was fairly agitated on arrival and was given Geodon. Her labs are currently pending. Will need psychiatric consult once medically cleared.  Care turned over to Dr. Dayna Barker pending lab review. Final Clinical Impression(s) / ED Diagnoses Final diagnoses:  Psychosis, unspecified psychosis type [facility])    Rx / [place] Orders ED Discharge Orders    None       Malvin Johns, MD 04/10/20 2243

## 2020-04-10 NOTE — ED Notes (Signed)
Initial contact with pt. Pt IVC and [place] police is with pt at bedside. Pt refuses to answer any questions asked and states," you don't have to clean my body when I die". Pt is on monitor x3 . Calm at the moment will continue to monitor. Awaiting psych evaluation.
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
## 2020-04-11 ENCOUNTER — Other Ambulatory Visit: Payer: Self-pay

## 2020-04-11 LAB — CBG MONITORING, ED: Glucose-Capillary: 232 mg/dL — ABNORMAL HIGH (ref 70–99)

## 2020-04-11 MED ORDER — SERTRALINE HCL 50 MG PO TABS: 100.0000 mg | ORAL_TABLET | Freq: Every day | ORAL | Status: DC

## 2020-04-11 MED ORDER — TRAZODONE HCL 100 MG PO TABS: 100.0000 mg | ORAL_TABLET | Freq: Every evening | ORAL | Status: DC | PRN

## 2020-04-11 MED ORDER — ZIPRASIDONE MESYLATE 20 MG IM SOLR
INTRAMUSCULAR | Status: AC
  Administered 2020-04-11: 08:00:00 20 mg via INTRAMUSCULAR
  Filled 2020-04-11: qty 20

## 2020-04-11 MED ORDER — DIPHENHYDRAMINE HCL 50 MG/ML IJ SOLN
50.0000 mg | Freq: Once | INTRAMUSCULAR | Status: AC
  Administered 2020-04-11: 15:00:00 50 mg via INTRAMUSCULAR
  Filled 2020-04-11: qty 1

## 2020-04-11 MED ORDER — ZIPRASIDONE MESYLATE 20 MG IM SOLR: 20.0000 mg | Freq: Once | INTRAMUSCULAR | Status: AC

## 2020-04-11 MED ORDER — PALIPERIDONE PALMITATE ER 234 MG/1.5ML IM SUSY: 234.0000 mg | PREFILLED_SYRINGE | INTRAMUSCULAR | Status: DC

## 2020-04-11 MED ORDER — LORAZEPAM 2 MG/ML IJ SOLN
2.0000 mg | Freq: Once | INTRAMUSCULAR | Status: AC
  Administered 2020-04-11: 13:00:00 2 mg via INTRAMUSCULAR
  Filled 2020-04-11: qty 1

## 2020-04-11 MED ORDER — LORAZEPAM 2 MG/ML IJ SOLN
2.0000 mg | Freq: Once | INTRAMUSCULAR | Status: AC
  Administered 2020-04-11: 15:00:00 2 mg via INTRAMUSCULAR
  Filled 2020-04-11: qty 1

## 2020-04-11 MED ORDER — HALOPERIDOL LACTATE 5 MG/ML IJ SOLN
5.0000 mg | Freq: Once | INTRAMUSCULAR | Status: AC
  Administered 2020-04-11: 15:00:00 5 mg via INTRAMUSCULAR
  Filled 2020-04-11: qty 1

## 2020-04-11 MED ORDER — STERILE WATER FOR INJECTION IJ SOLN
INTRAMUSCULAR | Status: AC
  Filled 2020-04-11: qty 10

## 2020-04-11 NOTE — ED Notes (Signed)
Patient walking down the hall grabbing at her chest stating "my name is not Landree, its Free". Pt asked to return to her room. Security called to assist patient back to room.

## 2020-04-11 NOTE — ED Notes (Signed)
Patient was seen running towards the front entrance of the hospital. Pt was then redirected by security to come back to stretcher.

## 2020-04-11 NOTE — ED Notes (Signed)
Pt sleeping at bedside no complaints no signs of distress. Will continue to monitor.

## 2020-04-11 NOTE — ED Provider Notes (Signed)
Labs unremarkable. Medically cleared for TTS consultation.    [name], Rachel Cower, MD 04/11/20 [phone]

## 2020-04-11 NOTE — ED Notes (Signed)
Pt. Made aware for the need of urine specimen. 

## 2020-04-11 NOTE — ED Provider Notes (Signed)
Called by nursing for agitation.  Pt roaming halls, trying to leave ED.  She is currently under IVC due to decompensated schizophrenia.  Will medicate with geodon for agitaiton.   Following Geodon patient is significantly improved. Called later due to increase wandering and she was treated with Ativan, 2 mg IM. Shortly thereafter she became increasingly agitated attempting to leave and attempting to run. She required additional sedation with Haldol, Benadryl and Ativan. Patient care transferred pending psychiatric disposition.  CRITICAL CARE Performed by: Quintella Reichert   Total critical care time: 35 minutes  Critical care time was exclusive of separately billable procedures and treating other patients.  Critical care was necessary to treat or prevent imminent or life-threatening deterioration.  Critical care was time spent personally by me on the following activities: development of treatment plan with patient and/or surrogate as well as nursing, discussions with consultants, evaluation of patient's response to treatment, examination of patient, obtaining history from patient or surrogate, ordering and performing treatments and interventions, ordering and review of laboratory studies, ordering and review of radiographic studies, pulse oximetry and re-evaluation of patient's condition.    Quintella Reichert, MD 04/11/20 1530

## 2020-04-11 NOTE — ED Notes (Signed)
Patient back into burgundy scrubs and belongings locked in cabinet.

## 2020-04-11 NOTE — ED Notes (Signed)
TTS at bedside. 

## 2020-04-11 NOTE — ED Notes (Signed)
Patient informed that a urine sample was needed. Pt stated "If you can control the pee coming out of me then you can get it". Will try to get urine at a later time.

## 2020-04-11 NOTE — ED Notes (Signed)
Patient continues to come to the nurses station asking to leave to go back to her house. Pt informed she is IVCd and can not leave; pt redirected back to stretcher.

## 2020-04-11 NOTE — ED Notes (Signed)
Patient stating she wants to leave, she wants the doctor to get her papers so that she can go, along with her car keys and a pack of cigarettes because she is ready leave. MD made aware.

## 2020-04-11 NOTE — ED Notes (Signed)
TTS complete. Otila Back, PA-C recommends Pt be evaluated by psychiatry in the morning.

## 2020-04-11 NOTE — BH Assessment (Signed)
[facility] Assessment Progress Note  Per Rachel Rankin, NP, this involuntary pt requires psychiatric hospitalization at this time.  Under the direction of Rachel Rout, MD this writer has sought outside placement for pt.  At 16:01 [name] calls from [facility] to report that pt has been accepted to their [place] campus by Dr [name] for tomorrow morning after 07:00.  [name] understands that pt has been referred to numerous facilities, and that if one is able to take her today, their bed offer would take priority.  She asks that we call her if this happens.  EDP Rachel Plan, DO concurs with this decision.  He and pt's nurse, [name], have been notified.  Report is to be called to [phone] or [phone].  Pt is to be transported via [facility] when the time comes.  [name] Behavioral Health Coordinator [phone]
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
## 2020-04-11 NOTE — ED Notes (Signed)
Dr. Ala Dach is with doing evaluation with pt at bedside.

## 2020-04-11 NOTE — BH Assessment (Addendum)
[facility] Assessment Progress Note  Per [name], NP, this pt requires psychiatric hospitalization at this time.  Pt presents under IVC initiated by pt's son and upheld by EDP Malvin Johns, MD.  Under the direction of [place] Abbot, MD this writer seeks placement for pt.  The following facilities have been contacted with results as noted:  Beds available, information sent, decision pending: [facility] system  Unable to reach: [facility] (left message at 14:11) [facility] (left message at 14:14) [facility] (left message at 14:56)  At capacity: [facility] [place], [place] Coordinator [phone]

## 2020-04-12 NOTE — ED Provider Notes (Signed)
Patient accepted to [facility].  Dr. Loyola Mast is the accepting.   Pricilla Loveless, MD 04/12/20 [phone]

## 2020-04-12 NOTE — ED Notes (Signed)
Attempted to call report to to [phone] and [phone]. No answer at either number.

## 2020-04-12 NOTE — ED Notes (Addendum)
Called [facility] to transport pt to [facility].

## 2020-04-13 NOTE — Progress Notes (Signed)
Internal Medicine Clinic Attending  Case discussed with Dr. Agyei  At the time of the visit.  We reviewed the resident's history and exam and pertinent patient test results.  I agree with the assessment, diagnosis, and plan of care documented in the resident's note.  

## 2020-04-22 ENCOUNTER — Other Ambulatory Visit: Payer: Self-pay | Admitting: Internal Medicine

## 2020-04-22 ENCOUNTER — Other Ambulatory Visit: Payer: Self-pay

## 2020-04-22 DIAGNOSIS — Z794 Long term (current) use of insulin: Secondary | ICD-10-CM

## 2020-04-22 DIAGNOSIS — B379 Candidiasis, unspecified: Secondary | ICD-10-CM

## 2020-04-22 DIAGNOSIS — E119 Type 2 diabetes mellitus without complications: Secondary | ICD-10-CM

## 2020-04-22 MED ORDER — FLUCONAZOLE 150 MG PO TABS: 150.0000 mg | ORAL_TABLET | Freq: Once | ORAL | 0 refills | Status: AC

## 2020-04-22 MED ORDER — INSULIN GLARGINE 100 UNIT/ML ~~LOC~~ SOLN: 7.0000 [IU] | Freq: Every day | SUBCUTANEOUS | 2 refills | Status: DC

## 2020-04-22 NOTE — Telephone Encounter (Signed)
There was a message left on the lab phone's VM from the patient requesting a call back.  RTC, patient was in the bathtub, but mother answered the phone and states patient needs refill on Lantus.  Also states patient has yeast infection, was seen in clinic on 04/05/20 and "yeast infection pill" was supposed to be called in.  RN informed mother patient may have to present to clinic for evaluation, she states pt has recently been to ED twice for psychotic episode and can't come to clinic anytime soon.  RN called [facility] to see if there are any refills available on Lantus, was placed on hold over 15 min.  RN unable to keep holding. Will forward request to MD. Laurence [place], RN,BSN

## 2020-04-22 NOTE — Telephone Encounter (Signed)
I will send in a one time dose of fluconazole as it looks like she has had yeast infections previously, and it looks like she's been seen in the ER a few times for her psychiatric issues.  I also waited on hold with [facility] and was unable to reach them. I will also refill her lantus, but can you let her and her mother know that her dosing was changed to 7U at the last visit? I will change her prescription to reflect this.

## 2020-04-25 ENCOUNTER — Other Ambulatory Visit (HOSPITAL_COMMUNITY): Payer: Self-pay | Admitting: Psychiatry

## 2020-04-25 ENCOUNTER — Other Ambulatory Visit: Payer: Self-pay | Admitting: *Deleted

## 2020-04-25 DIAGNOSIS — Z794 Long term (current) use of insulin: Secondary | ICD-10-CM

## 2020-04-25 DIAGNOSIS — F250 Schizoaffective disorder, bipolar type: Secondary | ICD-10-CM

## 2020-04-25 DIAGNOSIS — E119 Type 2 diabetes mellitus without complications: Secondary | ICD-10-CM

## 2020-04-25 MED ORDER — LOVASTATIN 40 MG PO TABS: 40.0000 mg | ORAL_TABLET | Freq: Every evening | ORAL | 0 refills | Status: DC

## 2020-04-25 MED ORDER — LANTUS SOLOSTAR 100 UNIT/ML ~~LOC~~ SOPN: 7.0000 [IU] | PEN_INJECTOR | Freq: Every day | SUBCUTANEOUS | 0 refills | Status: DC

## 2020-04-25 MED ORDER — PEN NEEDLES 32G X 4 MM MISC: 1.0000 "application " | Freq: Every day | 1 refills | Status: DC

## 2020-04-25 NOTE — Telephone Encounter (Signed)
Please send Rx for lantus pen and pen needles. Thank you. Hubbard Hartshorn, BSN, RN-BC

## 2020-04-25 NOTE — Addendum Note (Signed)
Addended by: Molli Hazard A on: 04/25/2020 05:14 PM   Modules accepted: Orders

## 2020-04-25 NOTE — Telephone Encounter (Signed)
Patient called in stating her lantus is not at pharmacy. Print option selected on Rx written 04/22/2020. Also, patient states she will need the pen as her vision will not allow her to draw up med from vial. Please send pen needles as well.  Patient wants to know if she should be taking metformin.  Needs refill on Mevacor.   Hubbard Hartshorn, BSN, RN-BC

## 2020-04-28 NOTE — Telephone Encounter (Signed)
Patient left a message that she would like a CGM to help her get off some of her diabetes medicines. Note order for a personal CGM and she has already scheduled an appointment for 05/03/20. Request a referral for same

## 2020-05-03 ENCOUNTER — Encounter: Payer: Medicare Other | Admitting: Dietician

## 2020-05-05 ENCOUNTER — Telehealth (HOSPITAL_COMMUNITY): Payer: Medicare Other | Admitting: Psychiatry

## 2020-05-05 ENCOUNTER — Encounter: Payer: Self-pay | Admitting: Dietician

## 2020-05-05 ENCOUNTER — Telehealth (HOSPITAL_COMMUNITY): Payer: Self-pay | Admitting: *Deleted

## 2020-05-05 ENCOUNTER — Ambulatory Visit (INDEPENDENT_AMBULATORY_CARE_PROVIDER_SITE_OTHER): Payer: Medicare Other | Admitting: Dietician

## 2020-05-05 DIAGNOSIS — E119 Type 2 diabetes mellitus without complications: Secondary | ICD-10-CM

## 2020-05-05 DIAGNOSIS — Z794 Long term (current) use of insulin: Secondary | ICD-10-CM | POA: Diagnosis not present

## 2020-05-05 DIAGNOSIS — Z713 Dietary counseling and surveillance: Secondary | ICD-10-CM | POA: Diagnosis not present

## 2020-05-05 NOTE — Patient Instructions (Signed)
Be careful when dressing to pick clothes up over the sensor, washing and drying to not rub on the sensor.   Check using the reader at least 6 times a day can do more if you want.   Keep the reader by your bed and check right before going to sleep and right away when you wake.   [name] [phone]

## 2020-05-05 NOTE — Telephone Encounter (Signed)
I called her and told her you would see her.  [name] is scheduling appt.  She is bringing dc summary with medications here.for you to review prior to appt.  I will put it in your box.

## 2020-05-05 NOTE — Progress Notes (Signed)
Diabetes Self-Management Education  Visit Type:  Follow-up   [employer] time:9:40 AM End time: 10:20 AM  [name] and her stepfather were educated today about the following:      -Reader device/ LibreLink  app programmed -Setting up device (Target range 70-180  ) Alarms (High Alert: 240) Low Alert: 80) -Inserting sensor (   L arm   - site bled a bit and was cleaned and covered with tegaderm patch  WNL) -Calibrating - none required but can confirm SG with finger stick BG -Ending sensor session - 14 day sensor use -Charging the battery of the reader -Trouble shooting/ contraindications -Tape guide - Downloading capabilities; [employer] -Reviewed BG treatment decisions with [name]  Patient has [employer] tech support contact and my information for questions or concerns. She agreed to follow up with reader and sensor in 2 weeks for further support in removing and placing a new sensor.    [name], identified by name and date of birth, is a 55 y.o. female with a diagnosis of Diabetes:  .   ASSESSMENT    Diabetes Self-Management Education - 05/05/20 1100      Health Coping   How would you rate your overall health? Fair   states has pain in right leg and trouble holding urine     Psychosocial Assessment   Patient Belief/Attitude about Diabetes Motivated to manage diabetes    Self-care barriers Lack of transportation;Lack of material resources    Self-management support Family    Patient Concerns Monitoring    Special Needs None    Preferred Learning Style Hands on    Learning Readiness Ready      Pre-Education Assessment   Patient understands the diabetes disease and treatment process. Demonstrates understanding / competency    Patient understands incorporating nutritional management into lifestyle. Needs Review    Patient undertands incorporating physical activity into lifestyle. Demonstrates understanding / competency     Patient understands using medications safely. Demonstrates understanding / competency    Patient understands monitoring blood glucose, interpreting and using results Needs Instruction    Patient understands prevention, detection, and treatment of acute complications. Demonstrates understanding / competency    Patient understands prevention, detection, and treatment of chronic complications. Demonstrates understanding / competency    Patient understands how to develop strategies to address psychosocial issues. Demonstrates understanding / competency    Patient understands how to develop strategies to promote health/change behavior. Demonstrates understanding / competency      Complications   Last HgB A1C per patient/outside source 7.5 %    How often do you check your blood sugar? 3-4 times / week    Fasting Blood glucose range (mg/dL) 70-129;130-179    Postprandial Blood glucose range (mg/dL) >200;180-200;130-179    Number of hypoglycemic episodes per month 0    Number of hyperglycemic episodes per week 3    Can you tell when your blood sugar is high? Yes    What do you do if your blood sugar is high? cuts back on carbs    Have you had a dilated eye exam in the past 12 months? No    Have you had a dental exam in the past 12 months? No    Are you checking your feet? Yes    How many days per week are you checking your feet? 7      Dietary Intake   Beverage(s) soda,juice, water      Patient Education  Previous Diabetes Education Yes (please comment)   here and when newly diagnosed   Monitoring Other (comment)   see CGM training documentation     Individualized Goals (developed by patient)   Monitoring  test my blood glucose as discussed      Outcomes   Program Status Not Completed      Subsequent Visit   Since your last visit have you continued or begun to take your medications as prescribed? Yes    Since your last visit have you had your blood pressure checked? Yes    Is your most  recent blood pressure lower, unchanged, or higher since your last visit? Unchanged    Since your last visit have you experienced any weight changes? No change    Since your last visit, are you checking your blood glucose at least once a day? No           Learning Objective:  Patient will have a greater understanding of diabetes self-management. Patient education plan is to attend individual and/or group sessions per assessed needs and concerns.   Plan:   Patient Instructions  Be careful when dressing to pick clothes up over the sensor, washing and drying to not rub on the sensor.   Check using the reader at least 6 times a day can do more if you want.   Keep the reader by your bed and check right before going to sleep and right away when you wake.   [name] [phone]   Expected Outcomes:  Demonstrated interest in learning. Expect positive outcomes Education material provided: Diabetes Resources If problems or questions, patient to contact team via:  Phone Future DSME appointment: - 2 wks   Debera Lat, RD 05/05/2020 11:24 AM.

## 2020-05-05 NOTE — Telephone Encounter (Signed)
Patients mother called and stated that patient had 3 days left of Depakote and that patient was discharged from your care but she was to have one last appointment here.  As your note stated she needs a higher level of care perhaps an act team.  Will you see her one more time?  She was discharged from [facility] and we do not have her current meds.   I can call her to get information.  She is angry with [name] and has called Patient Experience to complain.

## 2020-05-05 NOTE — Telephone Encounter (Signed)
I can see her but she does need a higher level of care.  Please call and get discharge summary, medication and a schedule her  appointment for 40 minutes.

## 2020-05-09 ENCOUNTER — Other Ambulatory Visit: Payer: Self-pay

## 2020-05-09 ENCOUNTER — Other Ambulatory Visit (HOSPITAL_COMMUNITY): Payer: Self-pay | Admitting: *Deleted

## 2020-05-09 ENCOUNTER — Encounter (HOSPITAL_COMMUNITY): Payer: Self-pay | Admitting: Psychiatry

## 2020-05-09 ENCOUNTER — Telehealth (INDEPENDENT_AMBULATORY_CARE_PROVIDER_SITE_OTHER): Payer: Medicare Other | Admitting: Psychiatry

## 2020-05-09 VITALS — Wt 174.0 lb

## 2020-05-09 DIAGNOSIS — F419 Anxiety disorder, unspecified: Secondary | ICD-10-CM

## 2020-05-09 DIAGNOSIS — F250 Schizoaffective disorder, bipolar type: Secondary | ICD-10-CM

## 2020-05-09 MED ORDER — PALIPERIDONE ER 6 MG PO TB24: 6.0000 mg | ORAL_TABLET | Freq: Every day | ORAL | 1 refills | Status: DC

## 2020-05-09 MED ORDER — TRAZODONE HCL 100 MG PO TABS
100.0000 mg | ORAL_TABLET | Freq: Every evening | ORAL | 0 refills | Status: DC | PRN
Start: 2020-05-09 — End: 2020-06-07

## 2020-05-09 MED ORDER — DIVALPROEX SODIUM 250 MG PO DR TAB: 750.0000 mg | DELAYED_RELEASE_TABLET | Freq: Every day | ORAL | 0 refills | Status: DC

## 2020-05-09 NOTE — Progress Notes (Signed)
cb

## 2020-05-09 NOTE — Progress Notes (Signed)
Virtual Visit via Telephone Note  I connected with [name] on 05/09/20 at  1:30 PM EST by telephone and verified that I am speaking with the correct person using two identifiers.  Location: Patient: Home  Provider: Home Office   I discussed the limitations, risks, security and privacy concerns of performing an evaluation and management service by telephone and the availability of in person appointments. I also discussed with the patient that there may be a patient responsible charge related to this service. The patient expressed understanding and agreed to proceed.   History of Present Illness: Patient is evaluated by phone session.  Her mother [name] is also on the phone with her.  Patient was seen in the emergency room 1 day before Christmas and found to be very disorganized, paranoid, religiously preoccupied and very agitated.  She was admitted to [facility] and discharged on Depakote.  Her Invega injection was discontinued and remain on Invega 6 mg by mouth.  Her Zoloft was also discontinued but she was given Depakote 750 mg a day.  The past we have a discussion about patient requiring higher level of care due to multiple hospitalization, limited insight and having a lot of issues with the mother.  Upon discharge mother requested that to be seen last time so she can have ample supply of medication as she has requested to establish care at [facility]. She is waiting for callback from [place].  Today patient again reintegrate that she does not have any psychiatric illness and she is taking the medication because her mother wants me to take the medicine and she is taking to calm down other people.  However she is not agitated and she feels trazodone is helping her sleep.  She is taking Depakote but she is concerned about side effects but did not specify the side effects.  We do not have Depakote level.  Patient denies any hallucination, paranoia, suicidal thoughts.  She reported her  mood is good.  Her mother also endorsed that she is been calm down but a lot of concern about her compliance issue and understanding into her mental illness.  As per mother patient is not recently agitated but does talk to her some time to herself.  She denies any panic attack.   Past Psychiatric History: H/Oschizoaffective d/owith at least 10inpatient and multiple suicidal attempt.H/Ojumping from running car, OD onbleach andhypertensive medication. [facility] Geodon helped. Took Depakote, Trazodone, perphenazine,temazepam, mirtazapine. Last inpatient switchedtoInvega.  Psychiatric Specialty Exam: Physical Exam  Review of Systems  Weight 174 lb (78.9 kg).There is no height or weight on file to calculate BMI.  General Appearance: NA  Eye Contact:  NA  Speech:  NA  Volume:  Decreased  Mood:  Irritable  Affect:  NA  Thought Process:  Descriptions of Associations: Intact  Orientation:  Full (Time, Place, and Person)  Thought Content:  Paranoid Ideation  Suicidal Thoughts:  No  Homicidal Thoughts:  No  Memory:  Immediate;   Fair Recent;   Fair Remote;   Fair  Judgement:  Other:  limited  Insight:  Fair  Psychomotor Activity:  NA  Concentration:  Concentration: Fair and Attention Span: Fair  Recall:  [employer] of Knowledge:  Fair  Language:  Good  Akathisia:  No  Handed:  Right  AIMS (if indicated):     Assets:  Desire for Improvement Housing  ADL's:  Intact  Cognition:  WNL  Sleep:   ok  Assessment and Plan: Schizoaffective disorder, bipolar type.  Anxiety.  I reviewed blood work results and discharge medication from [facility].  She is now on Depakote however we do not have a Depakote level.  Her sertraline and Invega injection was discontinued but patient is still on Invega 6 mg by mouth.  I had a long discussion with the patient and her mother about her medication side effects, risk of  relapse due to noncompliance with medication.  Patient continued to blame her mother for previous psychiatric hospitalization.  She does not believe she had mental illness but taking the medication because she felt that once she take the medicine it will calm people.  So far she reported no side effects but she has a lot of concern about the Depakote which was addressed.  I recommend she should do the Depakote level and CBC since we do not have the results.  She agreed with the plan.  For now I will continue Depakote 750 mg a day, Invega 6 mg daily and trazodone 100 mg at bedtime.  Patient is very from [place] to establish care there and I strongly encourage that she should discuss with the provider about medication dosage, getting regular blood work.  Her mother did request to follow her until she did appointment.  We have provided the 4-week supply of medication and if her schedule with [facility] did not happen we will follow-up for another 4 weeks.     Follow Up Instructions:    I discussed the assessment and treatment plan with the patient. The patient was provided an opportunity to ask questions and all were answered. The patient agreed with the plan and demonstrated an understanding of the instructions.   The patient was advised to call back or seek an in-person evaluation if the symptoms worsen or if the condition fails to improve as anticipated.  I provided 30 minutes of non-face-to-face time during this encounter.   Kathlee Nations, MD

## 2020-05-10 ENCOUNTER — Telehealth: Payer: Self-pay | Admitting: Dietician

## 2020-05-10 NOTE — Telephone Encounter (Signed)
[name]'s CGM sensor fell off. She scheduled an appointment to put another one on. She was encouraged to call for a replacement.

## 2020-05-11 ENCOUNTER — Ambulatory Visit (INDEPENDENT_AMBULATORY_CARE_PROVIDER_SITE_OTHER): Payer: Medicare Other | Admitting: Dietician

## 2020-05-11 ENCOUNTER — Encounter: Payer: Self-pay | Admitting: Dietician

## 2020-05-11 DIAGNOSIS — Z713 Dietary counseling and surveillance: Secondary | ICD-10-CM

## 2020-05-11 DIAGNOSIS — E119 Type 2 diabetes mellitus without complications: Secondary | ICD-10-CM | POA: Diagnosis not present

## 2020-05-11 DIAGNOSIS — Z794 Long term (current) use of insulin: Secondary | ICD-10-CM | POA: Diagnosis not present

## 2020-05-11 NOTE — Progress Notes (Signed)
Diabetes Self-Management Education  Visit Type: Follow-up  Appt. Start Time: 2:25 Appt. End Time: 3:15  05/11/2020  [name], identified by name and date of birth, is a 55 y.o. female with a diagnosis of Diabetes: type 2  Follow up with doctor Diabetes medications- metformin not on med list and patient not aware if she is taking it. Level 2 hypoglycemia (44) after taking 14 units Lantus Agree with getting pt off insulin/insulin secretagogues .  ASSESSMENT   Diabetes Self-Management Education - 05/11/20 1500      Visit Information   Visit Type Follow-up      Psychosocial Assessment   Patient Belief/Attitude about Diabetes Motivated to manage diabetes    Self-care barriers Lack of transportation    Self-management support Family    Patient Concerns Monitoring;Glycemic Control    Special Needs None    Preferred Learning Style Hands on    Learning Readiness Ready    How often do you need to have someone help you when you read instructions, pamphlets, or other written materials from your doctor or pharmacy? 2 - Rarely      Patient Education   Nutrition management  Other (comment)   Sugary beverages and foods to treat hypoglycemia   Monitoring Yearly dilated eye exam;Other (comment)   CGM trend arrows, placement, adhesive, alarms, reminders     Patient Self-Evaluation of Goals - Patient rates self as meeting previously set goals (% of time)   Monitoring 50 - 75 %      Outcomes   Future DMSE 3-4 months    Program Status Completed      Subsequent Visit   Since your last visit have you continued or begun to take your medications as prescribed? Yes    Since your last visit, are you checking your blood glucose at least once a day? Yes           Individualized Plan for Diabetes Self-Management Training:   Learning Objective:  Patient will have a greater understanding of diabetes self-management. Patient education plan is to attend individual and/or group sessions  per assessed needs and concerns.   Plan:   Patient Instructions  Thank you for your visit today!  Please continue to take the 7 units of LANTUS insulin as prescribed until you follow up with a doctor and you and they can review your Continuous glucose monitoring information.  Call [facility] if you want them to replace your sensor that did not aslt 14 days. If you do not get it replaced, you may run out at the end of 3 months before you get the new shipment.   Your eye exam is due- call [place] center at [phone]  To schedule an eye exam.   Your February 3 visit  With me will be over the phone- TELEHEALTH  [name] [phone]       Expected Outcomes:     Education material provided: Diabetes Resources  If problems or questions, patient to contact team via:  Phone  Future DSME appointment: 3-4 months   Debera Lat, RD 05/11/2020 3:30 PM.

## 2020-05-11 NOTE — Patient Instructions (Addendum)
Thank you for your visit today!  Please continue to take the 7 units of LANTUS insulin as prescribed until you follow up with a doctor and you and they can review your Continuous glucose monitoring information.  Call [facility] if you want them to replace your sensor that did not aslt 14 days. If you do not get it replaced, you may run out at the end of 3 months before you get the new shipment.   Your eye exam is due- call [place] center at [phone]  To schedule an eye exam.   Your February 3 visit  With me will be over the phone- TELEHEALTH  [name] [phone]

## 2020-05-18 ENCOUNTER — Other Ambulatory Visit: Payer: Self-pay

## 2020-05-18 ENCOUNTER — Ambulatory Visit (INDEPENDENT_AMBULATORY_CARE_PROVIDER_SITE_OTHER): Payer: Medicare Other | Admitting: Internal Medicine

## 2020-05-18 ENCOUNTER — Telehealth (HOSPITAL_COMMUNITY): Payer: Self-pay | Admitting: *Deleted

## 2020-05-18 ENCOUNTER — Telehealth: Payer: Self-pay

## 2020-05-18 DIAGNOSIS — I10 Essential (primary) hypertension: Secondary | ICD-10-CM

## 2020-05-18 LAB — CBC WITH DIFFERENTIAL/PLATELET
Basophils Absolute: 0 10*3/uL (ref 0.0–0.2)
Basos: 0 %
EOS (ABSOLUTE): 0.2 10*3/uL (ref 0.0–0.4)
Eos: 3 %
Hematocrit: 36.1 % (ref 34.0–46.6)
Hemoglobin: 12.3 g/dL (ref 11.1–15.9)
Immature Grans (Abs): 0 10*3/uL (ref 0.0–0.1)
Immature Granulocytes: 0 %
Lymphocytes Absolute: 2 10*3/uL (ref 0.7–3.1)
Lymphs: 33 %
MCH: 30.7 pg (ref 26.6–33.0)
MCHC: 34.1 g/dL (ref 31.5–35.7)
MCV: 90 fL (ref 79–97)
Monocytes Absolute: 0.4 10*3/uL (ref 0.1–0.9)
Monocytes: 6 %
Neutrophils Absolute: 3.5 10*3/uL (ref 1.4–7.0)
Neutrophils: 58 %
Platelets: 170 10*3/uL (ref 150–450)
RBC: 4.01 x10E6/uL (ref 3.77–5.28)
RDW: 14.1 % (ref 11.7–15.4)
WBC: 6.1 10*3/uL (ref 3.4–10.8)

## 2020-05-18 LAB — VALPROIC ACID LEVEL: Valproic Acid Lvl: 61 ug/mL (ref 50–100)

## 2020-05-18 MED ORDER — OLMESARTAN MEDOXOMIL 20 MG PO TABS: 20.0000 mg | ORAL_TABLET | Freq: Every day | ORAL | 0 refills | Status: DC

## 2020-05-18 MED ORDER — B-1 100 MG PO TABS: 100.0000 mg | ORAL_TABLET | Freq: Every day | ORAL | 1 refills | Status: DC

## 2020-05-18 NOTE — Telephone Encounter (Signed)
error 

## 2020-05-18 NOTE — Telephone Encounter (Signed)
Pt Valproic acid level wnl @ 61. FYI.

## 2020-05-18 NOTE — Progress Notes (Unsigned)
  [facility] Internal Medicine Residency Telephone Encounter Continuity Care Appointment  HPI:   This telephone encounter was created for [name] on 05/18/2020 for the following purpose/cc medication reconciliation and hypertension.   Past Medical History:  Past Medical History:  Diagnosis Date  . Alcohol abuse   . Anemia, iron deficiency   . Arteriosclerotic cardiovascular disease (ASCVD)    Minimal at cath in [facility].stress nuclear study in 8/08 with nl EF; neg stress echo in 2010  . Community acquired pneumonia 01/03/10, 05/2010, 04/2012   2011; with pleural effusion-hosp [facility] acute resp failure; intubated in Jan 2014 (HMPV pneumonia)  . Depression   . Diabetes mellitus, type 2 ([place]) 2000   Onset in 2000; no insulin  . Diarrhea 10/14/2013   Started 10/10/13, improved with Imodium.   . Diastolic dysfunction    grade 2 per echo 2011  . Dysphagia   . Gastroesophageal reflux disease    Schatzki's ring  . History of alcohol abuse 07/22/2007   Qualifier: Diagnosis of  By: Lenn Cal    . Hyperlipidemia   . Hypertension `   during treatment with Geodon  . Hypokalemia 12/25/2013  . Left knee pain 08/25/2014  . Obesity   . Oral candidiasis 05/16/2017  . PTSD (post-traumatic stress disorder)   . Pulmonary hypertension ([place]) 05/02/2012   Patient needs repeat echo in 06/2012   . Schizoaffective disorder    requiring multiple psychiatric admissions  . Viral URI 05/12/2013  . Viral wart on finger 10/08/2016  . Vision changes 08/12/2014      ROS:   No falls, dizziness, fever , or chills   Assessment / Plan / Recommendations:   Please see A&P under problem oriented charting for assessment of the patient's acute and chronic medical conditions.   As always, pt is advised that if symptoms worsen or new symptoms arise, they should go to an urgent care facility or to to ER for further evaluation.   Consent and Medical Decision Making:   Patient discussed with  Dr. Evette Doffing  This is a telephone encounter between [name] and Lorene Dy on 05/18/2020 for hypertension. The visit was conducted with the patient located at home and Lorene Dy at [facility]. The patient's identity was confirmed using their DOB and current address. The patient has consented to being evaluated through a telephone encounter and understands the associated risks (an examination cannot be done and the patient may need to come in for an appointment) / benefits (allows the patient to remain at home, decreasing exposure to coronavirus). I personally spent 22 minutes on medical discussion.

## 2020-05-18 NOTE — Telephone Encounter (Signed)
Call from pt - has questions about her medications; what should she be taking. States her mother gives her her meds; mother states she gives her what is prescribed by the doctor. She's concerns her BP today 152/103. Telehealth appt explained and she agrees - today @ 1315 PM.

## 2020-05-18 NOTE — Assessment & Plan Note (Signed)
Patient requested telehealth visit to review blood pressure medications. Her mother helps her manage her medications and also on the telephone. We also did a complete medication reconciliation. Patient says for the last 3 days her blood pressure has been 150's over 80's. She is concerned. Her mother adds context to her episodes of dizziness reported prior to today. She was not eating or drinking for days, this was in setting of uncontrolled schizoaffective disorder. She is doing better now after recent hospitalization.   Assessment: Uncontrolled HTN Plan: -olmesartan 20 mg - Patient will keep BP log  - Follow up in 3-4 weeks with BP log and to check BMP

## 2020-05-18 NOTE — Telephone Encounter (Signed)
FYI Pt called c/o 16 lb weight gain since restarting Depakote.

## 2020-05-18 NOTE — Telephone Encounter (Signed)
Depakote was started from last hospitalization.  We have recommended to follow-up with [facility]/ACT.  She has left the message and waiting for schedule appointment.  I do not want to change the medication since it is working and if she is accepted to high level of care then new provider can adjust the dose or change the medication if needed.

## 2020-05-19 ENCOUNTER — Telehealth: Payer: Self-pay | Admitting: Dietician

## 2020-05-19 ENCOUNTER — Encounter: Payer: Self-pay | Admitting: Internal Medicine

## 2020-05-19 ENCOUNTER — Ambulatory Visit: Payer: Medicare Other | Admitting: Dietician

## 2020-05-19 ENCOUNTER — Other Ambulatory Visit: Payer: Self-pay

## 2020-05-19 NOTE — Addendum Note (Signed)
Addended by: Lalla Brothers T on: 05/19/2020 09:15 AM   Modules accepted: Level of Service

## 2020-05-19 NOTE — Progress Notes (Signed)
Internal Medicine Clinic Attending  Case discussed with Dr. Steen  At the time of the visit.  We reviewed the resident's history and exam and pertinent patient test results.  I agree with the assessment, diagnosis, and plan of care documented in the resident's note.  

## 2020-05-19 NOTE — Progress Notes (Signed)
Diabetes Self-Management Education  This is a telephone encounter between [name]  and [name] Plyler  on 05/19/2020 for Diabetes Self Management Education & Support . The visit was conducted with the patient located at home and Debera Lat  at [facility]. The patient's identity was confirmed using their DOB and current address. The patient has consented to being evaluated through a telephone encounter and understands the associated risks / benefits (allows the patient to remain at home, decreasing exposure to coronavirus). I personally spent 19 minutes on medical nutrition therapy discussion.   The following statements were read to the patient and/or legal guardian that are established with the Nutritional Health Provider.   "The purpose of this phone visit is to provide behavioral health care while limiting exposure to the coronavirus (COVID19).  There is a possibility of technology failure and discussed alternative modes of communication if that failure occurs."   "By engaging in this telephone visit, you consent to the provision of healthcare.  Additionally, you authorize for your insurance to be billed for the services provided during this telephone visit."    Patient and/or legal guardian consented to telephone visit: yes   Visit Type:  follow up from CGM start  Appt. Start Time: 1325 Appt. End Time: 4332  05/19/2020  [name], identified by name and date of birth, is a 55 y.o. female with a diagnosis of Diabetes: type [place] states her blood sugar is 296 right now, trend arrow straight up- drink water. She knows this means it is high and going higher. She states she will  cut back on carbs and drink more water (had hard roll, ham, a little sweet tea, sprite). She was abel to state what foods have carbs and probably cause the increased blood sugar. Her blood sugar was  130s fasting today  She states she is scanning/chekcing glucose 6-7 times a day.  Sometimes in the middle of the night Her lowest blood sugar reported is  138- 154.  Diabetes medicines per her report: Lantus 8 units,  Metformin- not taking it right now She feels that the CGM is helping her to meet her goal: "To learn what foods spike her blood sugar"   Her mother was not able to connect to [place] to upload from home. I  will mail code and instructions  Individualized Plan for Diabetes Self-Management Training:   Learning Objective:  Patient will have a greater understanding of diabetes self-management. Patient education plan is to attend individual and/or group sessions per assessed needs and concerns.   Plan:   There are no Patient Instructions on file for this visit.  Expected Outcomes:    improved knowledge  Education material provided: Diabetes Resources  If problems or questions, patient to contact team via:  Phone  Future DSME appointment:   3 weeks  Debera Lat, RD 05/19/2020 1:18 PM.

## 2020-05-20 NOTE — Telephone Encounter (Signed)
Called Ms. Charissa Bash for her telehealth appointment today.

## 2020-05-29 ENCOUNTER — Encounter (HOSPITAL_COMMUNITY): Payer: Self-pay | Admitting: *Deleted

## 2020-05-29 ENCOUNTER — Ambulatory Visit (HOSPITAL_COMMUNITY)
Admission: EM | Admit: 2020-05-29 | Discharge: 2020-05-29 | Disposition: A | Discharge status: Home / Self Care | Payer: Medicare Other | Attending: Medical Oncology | Admitting: Medical Oncology

## 2020-05-29 ENCOUNTER — Other Ambulatory Visit: Payer: Self-pay

## 2020-05-29 DIAGNOSIS — K047 Periapical abscess without sinus: Secondary | ICD-10-CM

## 2020-05-29 MED ORDER — FLUCONAZOLE 150 MG PO TABS: 150.0000 mg | ORAL_TABLET | Freq: Every day | ORAL | 0 refills | Status: DC

## 2020-05-29 MED ORDER — CLINDAMYCIN HCL 300 MG PO CAPS: 300.0000 mg | ORAL_CAPSULE | Freq: Three times a day (TID) | ORAL | 0 refills | Status: AC

## 2020-05-29 MED ORDER — IBUPROFEN 800 MG PO TABS: 800.0000 mg | ORAL_TABLET | Freq: Three times a day (TID) | ORAL | 0 refills | Status: DC

## 2020-05-29 NOTE — ED Triage Notes (Signed)
PT reports swelling to gums and feels like there is puss under skin. Pt is having a hard time eating due to pain.

## 2020-05-29 NOTE — ED Provider Notes (Signed)
[place]    CSN: [id number] Arrival date & time: 05/29/20  1102      History   Chief Complaint Chief Complaint  Patient presents with  . Oral Swelling    HPI [name] is a 55 y.o. female.   HPI  Oral Pain and swelling: Pt reports that for the past 2 days she has had right upper dental pain.  Pain rated as aching and sharp in nature 9 out of 10.  She states that she has not seen her dentist in about 1 year.  She denies any injury to the tooth to cause the pain.  She suspects she has a dental abscess that she has had these previously.  She has not used anything for her symptoms yet.  She denies any fever, trouble breathing, trouble swallowing.  She states that she gets yeast infections with antibiotics and asked for Diflucan that she has tolerated well in the past.   Past Medical History:  Diagnosis Date  . Alcohol abuse   . Anemia, iron deficiency   . Arteriosclerotic cardiovascular disease (ASCVD)    Minimal at cath in [facility].stress nuclear study in 8/08 with nl EF; neg stress echo in 2010  . Community acquired pneumonia 01/03/10, 05/2010, 04/2012   2011; with pleural effusion-[facility] acute resp failure; intubated in Jan 2014 (HMPV pneumonia)  . Depression   . Diabetes mellitus, type 2 ([place]) 2000   Onset in 2000; no insulin  . Diarrhea 10/14/2013   Started 10/10/13, improved with Imodium.   . Diastolic dysfunction    grade 2 per echo 2011  . Dysphagia   . Gastroesophageal reflux disease    Schatzki's ring  . History of alcohol abuse 07/22/2007   Qualifier: Diagnosis of  By: Lenn Cal    . Hyperlipidemia   . Hypertension `   during treatment with Geodon  . Hypokalemia 12/25/2013  . Left knee pain 08/25/2014  . Obesity   . Oral candidiasis 05/16/2017  . PTSD (post-traumatic stress disorder)   . Pulmonary hypertension ([place]) 05/02/2012   Patient needs repeat echo in 06/2012   . Schizoaffective disorder    requiring multiple  psychiatric admissions  . Viral URI 05/12/2013  . Viral wart on finger 10/08/2016  . Vision changes 08/12/2014    Patient Active Problem List   Diagnosis Date Noted  . Manic behavior ([place])   . Psychosis ([place])   . Weight loss 06/15/2019  . Yeast infection 06/11/2019  . Lithium toxicity 06/03/2019  . Warts 02/19/2019  . Bipolar I disorder, most recent episode (or current) manic ([place]) 09/14/2018  . Schizoaffective disorder, bipolar type ([place]) 08/26/2017  . Healthcare maintenance 04/17/2017  . Nuclear sclerosis of both eyes 04/02/2016  . 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 Encounter for health education 10/24/2014  . IIH (idiopathic intracranial hypertension) 08/12/2014  . Seasonal allergies 02/23/2014  . Daytime hypersomnolence 12/09/2012  . Insomnia 11/06/2012  . COPD (chronic obstructive pulmonary disease) ([place]) 10/20/2012  . GERD (gastroesophageal reflux disease) 10/15/2012  . Preventative health care 10/15/2012  . Pulmonary hypertension ([place]) 05/02/2012  . Diastolic heart failure ([place]) 02/07/2012  . IDA (iron deficiency anemia) 11/06/2011  . Nausea & vomiting 07/06/2011  . Fatigue 05/17/2011  . Arteriosclerotic cardiovascular disease (ASCVD)   . TOBACCO ABUSE 09/27/2009  . Back pain 12/13/2008  . Hyperlipidemia 07/22/2007  . Obesity 07/22/2007  .  Resistant hypertension 07/22/2007  . Diabetes mellitus type 2, controlled ([place]) 09/28/2002    Past Surgical History:  Procedure Laterality Date  . COLONOSCOPY  01/2006   internal hemorrhoids  . COLONOSCOPY  01/10/2012   Dr. Rourk:Single anal canal hemorrhoidal tag likely source of  trivial hematochezia; right-sided colonic diverticulosis  . DILATION AND CURETTAGE, DIAGNOSTIC / THERAPEUTIC  1992  . ESOPHAGEAL DILATION N/A 08/18/2015   Procedure: ESOPHAGEAL DILATION;  Surgeon: Daneil Dolin, MD;   Location[facility];  Service: Endoscopy;  Laterality: N/A;  . ESOPHAGOGASTRODUODENOSCOPY  09/16/08   Dr. Trevor Iha hiatal hernia/excoriations involving the cardia and mucosa consistent with trauma, antral erosions  of linear petechiae ? gastritis versus early gastric antral vascular  ectasia.Marland Kitchen biopsy showed reactive gastropathy. No H. pylori.  . ESOPHAGOGASTRODUODENOSCOPY  09/2007   Dr. Evalee Mutton ring, dilated to 80 French Maloney dilator, small hiatal hernia, antral erosions, biopsies reactive gastropathy.  . ESOPHAGOGASTRODUODENOSCOPY (EGD) WITH PROPOFOL N/A 12/17/2013   [id number] antral erosions and petechiae. Small hiatal hernia. No endoscopic explanation for patient's symptoms  . ESOPHAGOGASTRODUODENOSCOPY (EGD) WITH PROPOFOL N/A 08/18/2015   Dr. Gala Romney: normal exam, s/p esophageal dilation  . ESOPHAGOGASTRODUODENOSCOPY (EGD) WITH PROPOFOL N/A 06/18/2019   Procedure: ESOPHAGOGASTRODUODENOSCOPY (EGD) WITH PROPOFOL;  Surgeon: Daneil Dolin, MD;  Location: [facility];  Service: Endoscopy;  Laterality: N/A;  2:30pm - office moved to 10:15  . SAVORY DILATION  07/17/2011   [facility] sedation-->distal esophageal stricture s/p dilation, chronic gastritis, multiple ulcers in stomach. no h.pylori    OB History    Gravida  3   Para  2   Term  2   Preterm      AB  1   Living  2     SAB      IAB  1   Ectopic      Multiple      Live Births  2            Home Medications    Prior to Admission medications   Medication Sig Start Date End Date Taking? Authorizing Provider  Accu-Chek Softclix Lancets lancets Use as instructed 03/04/20   Riesa Pope, MD  acetaminophen (TYLENOL) 500 MG tablet Take 1,000 mg by mouth every 6 (six) hours as needed for mild pain.    [provider]  Continuous Blood Gluc Receiver (FREESTYLE LIBRE 2 READER) DEVI 1 Device by Does not apply route 3 (three) times daily. 04/05/20   Jean Rosenthal, MD  Continuous Blood Gluc  Sensor (FREESTYLE LIBRE 2 SENSOR) MISC Apply sensor device to outer upper arm. 04/05/20   Agyei, Caprice Kluver, MD  divalproex (DEPAKOTE) 250 MG DR tablet Take 3 tablets (750 mg total) by mouth at bedtime. 05/09/20   Arfeen, Arlyce Harman, MD  esomeprazole (NEXIUM) 40 MG capsule TAKE 1 CAPSULE BY MOUTH TWICE DAILY BEFORE A MEAL FOR ACID REFLUX Patient taking differently: Take 40 mg by mouth 2 (two) times daily before a meal. TAKE 1 CAPSULE BY MOUTH TWICE DAILY BEFORE A MEAL FOR ACID REFLUX 02/18/20   [name], Vasilios, MD  ferrous sulfate 325 (65 FE) MG tablet Take 1 tablet (325 mg total) by mouth every other day. (May buy from over the counter): For anemia 03/04/20   [name], Vasilios, MD  glucose blood (ACCU-CHEK GUIDE) test strip USE THREE TIMES DAILY 03/04/20   Riesa Pope, MD  insulin glargine (LANTUS SOLOSTAR) 100 UNIT/ML Solostar Pen Inject 7 Units into the skin daily at 10 pm. 04/25/20  Seawell, Jaimie A, DO  Insulin Pen Needle (PEN NEEDLES) 32G X 4 MM MISC 1 application by Does not apply route at bedtime. 04/25/20   Seawell, Jaimie A, DO  lovastatin (MEVACOR) 40 MG tablet Take 1 tablet (40 mg total) by mouth every evening. 04/25/20 07/24/20  Jeralyn Bennett, MD  olmesartan (BENICAR) 20 MG tablet Take 1 tablet (20 mg total) by mouth daily. 05/18/20 07/17/20  Madalyn Rob, MD  paliperidone (INVEGA SUSTENNA) 234 MG/1.5ML SUSY injection Inject 234 mg into the muscle every 30 (thirty) days. Patient not taking: Reported on 05/09/2020 03/03/20   Arfeen, Arlyce Harman, MD  paliperidone (INVEGA) 6 MG 24 hr tablet Take 1 tablet (6 mg total) by mouth at bedtime. 05/09/20   Arfeen, Arlyce Harman, MD  Thiamine HCl (B-1) 100 MG TABS Take 1 tablet (100 mg total) by mouth daily. 05/18/20   Madalyn Rob, MD  traZODone (DESYREL) 100 MG tablet Take 1 tablet (100 mg total) by mouth at bedtime as needed for sleep. 05/09/20   Arfeen, Arlyce Harman, MD    Family History Family History  Problem Relation Age of Onset  . Hypertension Mother    . Stroke Father        deceased at age 46  . Colon cancer Other   . Heart disease Sister   . Diabetes Other   . High Cholesterol Other   . Arthritis Other   . Anesthesia problems Neg Hx   . Hypotension Neg Hx   . Malignant hyperthermia Neg Hx   . Pseudochol deficiency Neg Hx     Social History Social History   Tobacco Use  . Smoking status: Current Some Day Smoker    Packs/day: 1.00    Years: 30.00    Pack years: 30.00    Types: Cigarettes    Start date: 05/18/2012  . Smokeless tobacco: Never Used  . Tobacco comment: 1 PPD  Vaping Use  . Vaping Use: Never used  Substance Use Topics  . Alcohol use: No    Alcohol/week: 0.0 standard drinks    Comment: hx of ETOH abuse  . Drug use: No     Allergies   Cephalexin, Metronidazole, Orange, Orange oil, Other, Shrimp [shellfish allergy], Penicillins, Sulfonamide derivatives, Glipizide, Ace inhibitors, Sulfamethoxazole, Sulfamethoxazole-trimethoprim, and Trimethoprim   Review of Systems Review of Systems  As stated above in HPI Physical Exam Triage Vital Signs ED Triage Vitals  Enc Vitals Group     BP 05/29/20 1114 120/68     Pulse Rate 05/29/20 1114 (!) 102     Resp 05/29/20 1114 18     Temp 05/29/20 1114 98.6 F (37 C)     Temp Source 05/29/20 1114 Oral     SpO2 05/29/20 1114 100 %     Weight --      Height --      Head Circumference --      Peak Flow --      Pain Score 05/29/20 1117 8     Pain Loc --      Pain Edu? --      Excl. in [place]? --    No data found.  Updated Vital Signs BP 120/68 (BP Location: Right Arm)   Pulse (!) 102   Temp 98.6 F (37 C) (Oral)   Resp 18   SpO2 100%   Physical Exam Vitals and nursing note reviewed.  Constitutional:      General: She is not in acute distress.    Appearance: She is  not ill-appearing, toxic-appearing or diaphoretic.  HENT:     Head: Normocephalic.     Nose: Nose normal. No congestion or rhinorrhea.     Mouth/Throat:     Mouth: Mucous membranes are  moist. No oral lesions or angioedema.     Dentition: Abnormal dentition. Dental tenderness, dental caries and dental abscesses present.     Tongue: No lesions. Tongue does not deviate from midline.     Palate: No mass and lesions.     Pharynx: Oropharynx is clear. Uvula midline. No pharyngeal swelling, oropharyngeal exudate, posterior oropharyngeal erythema or uvula swelling.     Tonsils: No tonsillar exudate or tonsillar abscesses.   Neurological:     Mental Status: She is alert.      UC Treatments / Results  Labs (all labs ordered are listed, but only abnormal results are displayed) Labs Reviewed - No data to display  EKG   Radiology No results found.  Procedures Procedures (including critical care time)  Medications Ordered in UC Medications - No data to display  Initial Impression / Assessment and Plan / UC Course  I have reviewed the triage vital signs and the nursing notes.  Pertinent labs & imaging results that were available during my care of the patient were reviewed by me and considered in my medical decision making (see chart for details).     New. Treating for a dental abscess with clindamycin given her allergies. She reports that she drove herself from over 30 minutes away so we will avoid Toradol or strong medications here in clinic today. She is agreeable to outpatient ibuprofen. Also sending in diflucan per her request. Discussed red flag symptoms, common potential side effects and precautions along with blackbox warnings of both medications.  We also discussed red flag symptoms that would warrant immediate medical attention in the emergency room or if he and I will 1.  She will contact her dentist on Monday to schedule a follow-up regarding her discomfort. Final Clinical Impressions(s) / UC Diagnoses   Final diagnoses:  None   Discharge Instructions   None    ED Prescriptions    None     PDMP not reviewed this encounter.   Hughie Closs,  [place] 05/29/20 1150

## 2020-06-06 ENCOUNTER — Other Ambulatory Visit (HOSPITAL_COMMUNITY): Payer: Self-pay | Admitting: Psychiatry

## 2020-06-06 DIAGNOSIS — F419 Anxiety disorder, unspecified: Secondary | ICD-10-CM

## 2020-06-07 ENCOUNTER — Other Ambulatory Visit (HOSPITAL_COMMUNITY): Payer: Self-pay | Admitting: *Deleted

## 2020-06-07 ENCOUNTER — Telehealth (HOSPITAL_COMMUNITY): Payer: Self-pay | Admitting: Psychiatry

## 2020-06-07 DIAGNOSIS — F419 Anxiety disorder, unspecified: Secondary | ICD-10-CM

## 2020-06-07 MED ORDER — TRAZODONE HCL 100 MG PO TABS: 100.0000 mg | ORAL_TABLET | Freq: Every evening | ORAL | 0 refills | Status: AC | PRN

## 2020-06-09 ENCOUNTER — Telehealth: Payer: Self-pay | Admitting: *Deleted

## 2020-06-09 ENCOUNTER — Encounter: Payer: Medicare Other | Admitting: Internal Medicine

## 2020-06-09 ENCOUNTER — Encounter: Payer: Medicare Other | Admitting: Dietician

## 2020-06-09 NOTE — Telephone Encounter (Signed)
CALLED PATIENT LEFT VOICE MESSAGE FOR PATIENT TO RETURN CALL TO CLINIC([phone]) TO RESCHEDULE HER MISSED APPOINTMENT.

## 2020-06-15 ENCOUNTER — Telehealth: Payer: Self-pay

## 2020-06-15 ENCOUNTER — Other Ambulatory Visit: Payer: Self-pay | Admitting: Student

## 2020-06-15 ENCOUNTER — Ambulatory Visit (INDEPENDENT_AMBULATORY_CARE_PROVIDER_SITE_OTHER): Payer: Medicare Other | Admitting: Student

## 2020-06-15 ENCOUNTER — Encounter: Payer: Self-pay | Admitting: Student

## 2020-06-15 ENCOUNTER — Other Ambulatory Visit: Payer: Self-pay

## 2020-06-15 ENCOUNTER — Encounter: Payer: Medicare Other | Admitting: Dietician

## 2020-06-15 DIAGNOSIS — I10 Essential (primary) hypertension: Secondary | ICD-10-CM

## 2020-06-15 DIAGNOSIS — Z794 Long term (current) use of insulin: Secondary | ICD-10-CM

## 2020-06-15 DIAGNOSIS — E119 Type 2 diabetes mellitus without complications: Secondary | ICD-10-CM

## 2020-06-15 DIAGNOSIS — R6889 Other general symptoms and signs: Secondary | ICD-10-CM

## 2020-06-15 MED ORDER — PEN NEEDLES 32G X 4 MM MISC: 1.0000 "application " | Freq: Every day | 1 refills | Status: AC

## 2020-06-15 MED ORDER — LANTUS SOLOSTAR 100 UNIT/ML ~~LOC~~ SOPN: 7.0000 [IU] | PEN_INJECTOR | Freq: Every day | SUBCUTANEOUS | 0 refills | Status: AC

## 2020-06-15 MED ORDER — METFORMIN HCL 1000 MG PO TABS
1000.0000 mg | ORAL_TABLET | Freq: Every day | ORAL | 11 refills | Status: DC
Start: 2020-06-15 — End: 2020-09-21

## 2020-06-15 NOTE — Addendum Note (Signed)
Addended byGaylan Gerold on: 06/15/2020 03:30 PM   Modules accepted: Orders

## 2020-06-15 NOTE — Assessment & Plan Note (Addendum)
Hgb A1C was 7.5 4 months ago. Patient reports that her fasting sugar this morning was 196.  She noticed some high blood sugar reading in the 300 - 400s postprandial.  Patient is using the freestyle libre and did not notice any hypoglycemic event.  Patient states that she is not taking the Metformin.  Per Dr. Nelia Shi note in December, she should continue taking Metformin 1 g daily.  I will send a prescription of Metformin to her pharmacy.  Advised patient to return to clinic as soon as possible to recheck her hemoglobin A1c.   -Continue Lantus 7 units daily at bedtime -Resume Metformin 1000 mg daily  -Recheck A1C when she returns to clinic

## 2020-06-15 NOTE — Telephone Encounter (Signed)
Thank you for letting me know Stacee!

## 2020-06-15 NOTE — Telephone Encounter (Signed)
I resent a new Rx of 15 pens and pen needles. Thank you

## 2020-06-15 NOTE — Assessment & Plan Note (Signed)
Patient woke up this morning with coughing, congestion and fatigue.  She also endorses sore throat, mild shortness of breath and shoulder ache.  She denies fever, nausea, vomiting, diarrhea, change of taste or smell, sputum production.  She is not on supplemental oxygen and not using any inhaler.   Her home rapid Covid test was negative.  Patient received 1 shot of Covid vaccine but no flu shot last year.  She endorses smoking 1 pack a day.  Assessment and plan Her symptoms consistent with a common cold.  Patient sounds normal in the phone call, able to speak in full sentence with no labored breathing.  Advised patient to continue conservative treatment such as honey for coughing.  Advised patient that if she becomes more short of breath or symptoms not improving, she should call the clinic.  Patient verbalizes understanding.

## 2020-06-15 NOTE — Telephone Encounter (Signed)
Thank you for the update!

## 2020-06-15 NOTE — Telephone Encounter (Signed)
Received TC from patient who c/o coughing, congestion, and "feeling weak" since waking up this morning.  She has a scheduled, in-person appt today at 1:15 for a 3 month DM check w/ MD.  She is requesting a telehealth appt.  Appt changed to telehealth.  Pt also requesting to cancel appt today with diabetic educator.  Patient informed to call back and r/s 3 month f/u for DM check with MD and diabetic educator and she verbalized understanding.  Pt states she has at home test kits for Covid.  She was instructed to test before appt with MD this afternoon. SChaplin, RN,BSN

## 2020-06-15 NOTE — Progress Notes (Signed)
  [facility] Internal Medicine Residency Telephone Encounter Continuity Care Appointment  HPI:   This telephone encounter was created for [name] on 06/15/2020 for the following purpose/cc flu-like symptoms and diabetes management.    Past Medical History:  Past Medical History:  Diagnosis Date  . Alcohol abuse   . Anemia, iron deficiency   . Arteriosclerotic cardiovascular disease (ASCVD)    Minimal at cath in [facility].stress nuclear study in 8/08 with nl EF; neg stress echo in 2010  . Community acquired pneumonia 01/03/10, 05/2010, 04/2012   2011; with pleural effusion-[facility] acute resp failure; intubated in Jan 2014 (HMPV pneumonia)  . Depression   . Diabetes mellitus, type 2 ([place]) 2000   Onset in 2000; no insulin  . Diarrhea 10/14/2013   Started 10/10/13, improved with Imodium.   . Diastolic dysfunction    grade 2 per echo 2011  . Dysphagia   . Gastroesophageal reflux disease    Schatzki's ring  . History of alcohol abuse 07/22/2007   Qualifier: Diagnosis of  By: Lenn Cal    . Hyperlipidemia   . Hypertension `   during treatment with Geodon  . Hypokalemia 12/25/2013  . Left knee pain 08/25/2014  . Obesity   . Oral candidiasis 05/16/2017  . PTSD (post-traumatic stress disorder)   . Pulmonary hypertension ([place]) 05/02/2012   Patient needs repeat echo in 06/2012   . Schizoaffective disorder    requiring multiple psychiatric admissions  . Viral URI 05/12/2013  . Viral wart on finger 10/08/2016  . Vision changes 08/12/2014      ROS:   Negative for fever, nausea, vomiting, diarrhea, change in taste or smell, sputum production.  Positive for mild shortness of breath, sore throat, shoulder ache.   Assessment / Plan / Recommendations:   Please see A&P under problem oriented charting for assessment of the patient's acute and chronic medical conditions.   As always, pt is advised that if symptoms worsen or new symptoms arise, they should go to an  urgent care facility or to to ER for further evaluation.   Consent and Medical Decision Making:   Patient discussed with Dr. Angelia Mould  This is a telephone encounter between [name] and Gaylan Gerold on 06/15/2020 for flu-like symptoms and diabetes management. The visit was conducted with the patient located at home and Gaylan Gerold at [facility]. The patient's identity was confirmed using their DOB and current address. The patient has consented to being evaluated through a telephone encounter and understands the associated risks (an examination cannot be done and the patient may need to come in for an appointment) / benefits (allows the patient to remain at home, decreasing exposure to coronavirus). I personally spent 17 minutes on medical discussion.

## 2020-06-15 NOTE — Telephone Encounter (Signed)
Received TC from patient who states the pharmacy told her she had a RX ready for Lantus, 3 pens.  She states she always receives 15 pens at a time.  She is asking MD to resend RX for 15 pens. Pt had a Telehealth appt today w/ Dr. Alfonse Spruce Thank you, SChaplin, RN,BSN

## 2020-06-15 NOTE — Assessment & Plan Note (Signed)
Patient reports that her systolic blood pressure normally runs in 120 - 130.  She had an episode of high blood pressure in the 160/90 a few days ago.  I advised patient to continue taking the olmesartan and keep a log of her blood pressure.  Will adjust her blood pressure medication if appropriate when she comes back to the clinic. -Continue olmesartan 20 mg daily

## 2020-06-17 NOTE — Progress Notes (Signed)
Internal Medicine Clinic Attending  Case discussed with Dr. Nguyen  At the time of the visit.  We reviewed the resident's history and exam and pertinent patient test results.  I agree with the assessment, diagnosis, and plan of care documented in the resident's note. 

## 2020-06-20 ENCOUNTER — Ambulatory Visit (INDEPENDENT_AMBULATORY_CARE_PROVIDER_SITE_OTHER): Payer: Medicare Other | Admitting: Student

## 2020-06-20 ENCOUNTER — Other Ambulatory Visit: Payer: Self-pay

## 2020-06-20 ENCOUNTER — Encounter: Payer: Self-pay | Admitting: Student

## 2020-06-20 VITALS — BP 135/84 | HR 92 | Temp 98.7°F | Ht 64.0 in | Wt 186.4 lb

## 2020-06-20 DIAGNOSIS — I2510 Atherosclerotic heart disease of native coronary artery without angina pectoris: Secondary | ICD-10-CM

## 2020-06-20 DIAGNOSIS — Z794 Long term (current) use of insulin: Secondary | ICD-10-CM

## 2020-06-20 DIAGNOSIS — E113291 Type 2 diabetes mellitus with mild nonproliferative diabetic retinopathy without macular edema, right eye: Secondary | ICD-10-CM | POA: Diagnosis not present

## 2020-06-20 DIAGNOSIS — I10 Essential (primary) hypertension: Secondary | ICD-10-CM | POA: Diagnosis not present

## 2020-06-20 DIAGNOSIS — E119 Type 2 diabetes mellitus without complications: Secondary | ICD-10-CM

## 2020-06-20 DIAGNOSIS — Z0000 Encounter for general adult medical examination without abnormal findings: Secondary | ICD-10-CM

## 2020-06-20 DIAGNOSIS — E785 Hyperlipidemia, unspecified: Secondary | ICD-10-CM

## 2020-06-20 LAB — POCT GLYCOSYLATED HEMOGLOBIN (HGB A1C): Hemoglobin A1C: 10.2 % — AB (ref 4.0–5.6)

## 2020-06-20 LAB — GLUCOSE, CAPILLARY: Glucose-Capillary: 247 mg/dL — ABNORMAL HIGH (ref 70–99)

## 2020-06-20 MED ORDER — TRULICITY 0.75 MG/0.5ML ~~LOC~~ SOAJ: 0.7500 mg | SUBCUTANEOUS | 12 refills | Status: DC

## 2020-06-20 NOTE — Assessment & Plan Note (Addendum)
Check lipid panel today  Addendum ASCVD risk 11.7%.  Patient is recommended to be on a high intensity statin.  Per chart review, patient was on Lipitor at some point in the past but was discontinued due to side effects of muscle ache.  We will try Crestor 20 mg daily and discontinue lovastatin.  -Start Crestor 20 mg daily -Stop lovastatin

## 2020-06-20 NOTE — Progress Notes (Signed)
   CC: Diabetes management  HPI:  [name] is a 55 y.o. with past medical history of hypertension, diabetes, COPD who presented to the clinic for diabetes management.  Please see problem based charting for further detail.  Past Medical History:  Diagnosis Date  . Alcohol abuse   . Anemia, iron deficiency   . Arteriosclerotic cardiovascular disease (ASCVD)    Minimal at cath in [facility].stress nuclear study in 8/08 with nl EF; neg stress echo in 2010  . Community acquired pneumonia 01/03/10, 05/2010, 04/2012   2011; with pleural effusion-[facility] acute resp failure; intubated in Jan 2014 (HMPV pneumonia)  . Depression   . Diabetes mellitus, type 2 ([place]) 2000   Onset in 2000; no insulin  . Diarrhea 10/14/2013   Started 10/10/13, improved with Imodium.   . Diastolic dysfunction    grade 2 per echo 2011  . Dysphagia   . Gastroesophageal reflux disease    Schatzki's ring  . History of alcohol abuse 07/22/2007   Qualifier: Diagnosis of  By: Lenn Cal    . Hyperlipidemia   . Hypertension `   during treatment with Geodon  . Hypokalemia 12/25/2013  . Left knee pain 08/25/2014  . Obesity   . Oral candidiasis 05/16/2017  . PTSD (post-traumatic stress disorder)   . Pulmonary hypertension ([place]) 05/02/2012   Patient needs repeat echo in 06/2012   . Schizoaffective disorder    requiring multiple psychiatric admissions  . Viral URI 05/12/2013  . Viral wart on finger 10/08/2016  . Vision changes 08/12/2014   Review of Systems: As per HPI  Physical Exam:  Vitals:   06/20/20 0946 06/20/20 1039  BP: (!) 129/93 135/84  Pulse: 93 92  Temp: 98.7 F (37.1 C)   TempSrc: Oral   SpO2: 98%   Weight: 186 lb 6.4 oz (84.6 kg)   Height: 5\' 4"  (1.626 m)    Physical Exam Constitutional:      General: She is not in acute distress. HENT:     Head: Normocephalic.  Eyes:     General:        Right eye: No discharge.        Left eye: No discharge.  Cardiovascular:     Rate  and Rhythm: Normal rate and regular rhythm.  Pulmonary:     Effort: Pulmonary effort is normal. No respiratory distress.  Musculoskeletal:     Right lower leg: No edema.     Left lower leg: No edema.     Comments: No wounds or ulcer of bilateral feet.  Dorsalis pedis pulse palpated bilaterally.  Skin:    General: Skin is warm.  Neurological:     Mental Status: She is alert.  Psychiatric:        Mood and Affect: Mood normal.     Assessment & Plan:   See Encounters Tab for problem based charting.  Patient discussed with Dr. Dareen Piano

## 2020-06-20 NOTE — Assessment & Plan Note (Signed)
Hemoglobin A1c was 7.54 months ago, elevated to 10.2 today.  Likely due to patient not taking the Metformin.  Per patient, she was told to discontinue however Dr. Nelia Shi notes said to continue Metformin.  She reports compliant with the Lantus.  No change in her diet recently.  Assessment and plan Her significant elevation in hemoglobin A1c is likely due to not taking Metformin.  Her Metformin was restarted a few days ago during a telehealth visit with me.  We will increase Metformin dosage and add Trulicity.  Patient reports history of DKA so avoid SGLT2. -Start Metformin 1000 mg in AM and 500 mg in PM for 1 week. Increase to 1000 mg BID if tolerated. -Add Trulicity 8.28 mg weekly -Maintain Lantus 7 units daily. Hopefully the new medications adjustment can help patient titrate off lantus.  -Hgb A1c in 3 months -Opthalmology referral

## 2020-06-20 NOTE — Patient Instructions (Signed)
Ms. Antwine,  It is a pleasure seeing you in the clinic today. He is a summary what we talked about  1. Diabetes: Your hemoglobin A1c is 10.2 today. I will adjust your medication: -Metformin: Take 1000 mg in the morning and 500 mg in the afternoon for 1 week. Then increase to 1000 mg 2 times daily if tolerated. -Same dosage of Lantus. Will call pharmacy to confirm -Add Trulicity once weekly injection -We'll recheck hemoglobin A1c in 3 months  2. Hypertension: Your blood pressure is reasonable. I would not make any changes today.  I will send a referral for an eye doctor for diabetes. Also referral for mammogram and Pap smear.  Please come back in clinic in 3 months  Take care  Dr. Alfonse Spruce

## 2020-06-20 NOTE — Assessment & Plan Note (Signed)
-  Patient received 1 shot of Covid vaccine, will try to get the second shot -Referral to mammogram -Referral to OB/GYN for Pap smear

## 2020-06-20 NOTE — Assessment & Plan Note (Addendum)
Blood pressure is reasonable today.  -Continue olmesartan 20 mg daily -Obtain BMP  Addendum: Serum creatinine mildly elevated 1.3, baseline around 1.1.  This is likely due to dehydration from her elevated blood sugar.  Advised patient to drink plenty of fluid.  We will recheck a BMP in 2-3 weeks.  -BMP in 2-3 weeks

## 2020-06-21 LAB — BMP8+ANION GAP
Anion Gap: 17 mmol/L (ref 10.0–18.0)
BUN/Creatinine Ratio: 13 (ref 9–23)
BUN: 18 mg/dL (ref 6–24)
CO2: 20 mmol/L (ref 20–29)
Calcium: 9.8 mg/dL (ref 8.7–10.2)
Chloride: 100 mmol/L (ref 96–106)
Creatinine, Ser: 1.35 mg/dL — ABNORMAL HIGH (ref 0.57–1.00)
Glucose: 257 mg/dL — ABNORMAL HIGH (ref 65–99)
Potassium: 4.7 mmol/L (ref 3.5–5.2)
Sodium: 137 mmol/L (ref 134–144)
eGFR: 47 mL/min/{1.73_m2} — ABNORMAL LOW (ref >59)

## 2020-06-21 LAB — LIPID PANEL
Chol/HDL Ratio: 3.8 ratio (ref 0.0–4.4)
Cholesterol, Total: 197 mg/dL (ref 100–199)
HDL: 52 mg/dL (ref >39)
LDL Chol Calc (NIH): 111 mg/dL — ABNORMAL HIGH (ref 0–99)
Triglycerides: 196 mg/dL — ABNORMAL HIGH (ref 0–149)
VLDL Cholesterol Cal: 34 mg/dL (ref 5–40)

## 2020-06-21 MED ORDER — ROSUVASTATIN CALCIUM 20 MG PO TABS: 20.0000 mg | ORAL_TABLET | Freq: Every day | ORAL | 11 refills | Status: DC

## 2020-06-21 NOTE — Addendum Note (Signed)
Addended byGaylan Gerold on: 06/21/2020 09:51 AM   Modules accepted: Orders

## 2020-06-22 NOTE — Progress Notes (Signed)
Internal Medicine Clinic Attending  Case discussed with Dr. Nguyen  At the time of the visit.  We reviewed the resident's history and exam and pertinent patient test results.  I agree with the assessment, diagnosis, and plan of care documented in the resident's note. 

## 2020-07-07 ENCOUNTER — Other Ambulatory Visit: Payer: Self-pay

## 2020-07-07 DIAGNOSIS — I10 Essential (primary) hypertension: Secondary | ICD-10-CM

## 2020-07-07 NOTE — Telephone Encounter (Signed)
Pt is requesting her olmesartan (BENICAR) 20 MG tablet sent to [facility] [phone] - [place], [address] AT [place] Phone:  [phone]  Fax:  [phone]      ( pt stated that the pharmacy tech told her they have faxed over a request but heard nothing back )

## 2020-07-08 MED ORDER — OLMESARTAN MEDOXOMIL 20 MG PO TABS: 20.0000 mg | ORAL_TABLET | Freq: Every day | ORAL | 2 refills | Status: AC

## 2020-07-26 ENCOUNTER — Encounter: Payer: Self-pay | Admitting: *Deleted

## 2020-08-03 ENCOUNTER — Encounter: Payer: Medicare Other | Admitting: Family Medicine

## 2020-08-03 NOTE — Addendum Note (Signed)
Addended by: Hulan Fray on: 08/03/2020 04:28 PM   Modules accepted: Orders

## 2020-08-24 NOTE — Addendum Note (Signed)
Addended by: Hulan Fray on: 08/24/2020 04:45 PM   Modules accepted: Orders

## 2020-08-25 ENCOUNTER — Encounter: Payer: Medicare Other | Admitting: Internal Medicine

## 2020-08-25 ENCOUNTER — Telehealth: Payer: Self-pay

## 2020-08-25 ENCOUNTER — Other Ambulatory Visit: Payer: Self-pay

## 2020-08-25 NOTE — Telephone Encounter (Signed)
Please call pt's mother back, states pt is not feeling well.

## 2020-08-25 NOTE — Telephone Encounter (Signed)
Return call to pt's mother, Jasper Loser. Stated everytime pt eats something she vomits it back up; and she's very weak. I asked how long has this been going on, she stated 1.5 weeks. I asked if pt is able to drink anything; stated she has been forcing herself to drink water which has been able to stay down. Denies fever,chills. I also asked if she as taken any medication; the mother stated she has tried OTC dramamine. Informed the mother sounds like pt needs to be seen today ; the mother asked the pt who refused to come in. I suggested a telehealth appt - pt's agreeable. Appt scheduled today with Dr Charleen Kirks @ 1515 PM. Pt's mother stated pt's son will be there with the pt and to call his telephone# [phone].

## 2020-08-30 NOTE — Addendum Note (Signed)
Addended byGaylan Gerold on: 08/30/2020 10:36 AM   Modules accepted: Orders

## 2020-08-31 ENCOUNTER — Ambulatory Visit (INDEPENDENT_AMBULATORY_CARE_PROVIDER_SITE_OTHER): Payer: Medicare Other | Admitting: Internal Medicine

## 2020-08-31 ENCOUNTER — Other Ambulatory Visit: Payer: Self-pay

## 2020-08-31 DIAGNOSIS — Z794 Long term (current) use of insulin: Secondary | ICD-10-CM

## 2020-08-31 DIAGNOSIS — R112 Nausea with vomiting, unspecified: Secondary | ICD-10-CM | POA: Diagnosis not present

## 2020-08-31 DIAGNOSIS — E119 Type 2 diabetes mellitus without complications: Secondary | ICD-10-CM

## 2020-08-31 DIAGNOSIS — F250 Schizoaffective disorder, bipolar type: Secondary | ICD-10-CM

## 2020-08-31 NOTE — Assessment & Plan Note (Addendum)
Ms. Abascal mother states that when he developed nausea and vomiting for approximately 4 to 5 days during which time her antipsychotic was unknowingly not being given.  After paliperidone was restarted, nausea and vomiting resolved.  No further concerns at this time.

## 2020-08-31 NOTE — Progress Notes (Signed)
  [facility] Internal Medicine Residency Telephone Encounter Continuity Care Appointment  HPI:   This telephone encounter was created for [name] on 08/31/2020 for the following purpose/cc nausea and vomiting. Please see the Encounters tab for problem-based Assessment & Plan regarding status of patient's acute and chronic conditions.   Past Medical History:  Past Medical History:  Diagnosis Date  . Alcohol abuse   . Anemia, iron deficiency   . Arteriosclerotic cardiovascular disease (ASCVD)    Minimal at cath in [facility].stress nuclear study in 8/08 with nl EF; neg stress echo in 2010  . Community acquired pneumonia 01/03/10, 05/2010, 04/2012   2011; with pleural effusion-[facility] acute resp failure; intubated in Jan 2014 (HMPV pneumonia)  . Depression   . Diabetes mellitus, type 2 ([place]) 2000   Onset in 2000; no insulin  . Diarrhea 10/14/2013   Started 10/10/13, improved with Imodium.   . Diastolic dysfunction    grade 2 per echo 2011  . Dysphagia   . Gastroesophageal reflux disease    Schatzki's ring  . History of alcohol abuse 07/22/2007   Qualifier: Diagnosis of  By: Lenn Cal    . Hyperlipidemia   . Hypertension `   during treatment with Geodon  . Hypokalemia 12/25/2013  . Left knee pain 08/25/2014  . Obesity   . Oral candidiasis 05/16/2017  . PTSD (post-traumatic stress disorder)   . Pulmonary hypertension ([place]) 05/02/2012   Patient needs repeat echo in 06/2012   . Schizoaffective disorder    requiring multiple psychiatric admissions  . Viral URI 05/12/2013  . Viral wart on finger 10/08/2016  . Vision changes 08/12/2014      ROS:      Assessment / Plan / Recommendations:   Please see A&P under problem oriented charting for assessment of the patient's acute and chronic medical conditions.   As always, pt is advised that if symptoms worsen or new symptoms arise, they should go to an urgent care facility or to to ER for further evaluation.    Consent and Medical Decision Making:   Patient discussed with Dr. Dareen Piano  This is a telephone encounter between [name] and Jose Persia on 08/31/2020 for nausea and vomiting. The visit was conducted with the patient located at home and Jose Persia at [facility]. The patient's identity was confirmed using their DOB and current address. The his/her legal guardian has consented to being evaluated through a telephone encounter and understands the associated risks (an examination cannot be done and the patient may need to come in for an appointment) / benefits (allows the patient to remain at home, decreasing exposure to coronavirus). I personally spent 20 minutes on medical discussion.

## 2020-09-01 NOTE — Assessment & Plan Note (Signed)
[name] is currently being managed on paliperidone 6 mg daily at bedtime per chart review.  Ivannah's mother was concerned that paliperidone was held throughout April and May up until approximately 3 days ago.  She found an old bottle of 3 mg tablets and started the and patient's nausea and vomiting has improved.  She is requesting further recommendations at this time.  She notes that she has a follow-up with psychiatrist in 5 days.  Assessment/plan: Recommended that dose of paliperidone be increased to 6 mg using the tablet she currently has and that she keep her appointment coming up with her psychiatrist.

## 2020-09-01 NOTE — Assessment & Plan Note (Signed)
Ms. Rezek mother states that Kearie went to go visit her daughter in April during which time she was not taking her Lantus, however she does not know exactly when the Lantus was discontinued.  She also was not taking her metformin during this time.  On return to [place] started Trulicity and has so far received 2 doses.  Her mom checks her CBGs in the morning and they are usually around 160 but no greater than 200.  Given nausea and vomiting though, patient was having soda and these caused some sugar fluctuations.  She is requesting further recommendations at this time.  Assessment/plan: Given that patient's CBGs in the morning are not quite at goal but acceptable at this time, recommended she continue holding Lantus.  Recommended she continue Trulicity and follow-up in approximately 2 weeks.  Cautioned Chantrell's mother that if his morning CBG goes above 250 to please give the clinic a call.

## 2020-09-01 NOTE — Progress Notes (Signed)
Internal Medicine Clinic Attending  Case discussed with Dr. Basaraba  At the time of the visit.  We reviewed the resident's history and exam and pertinent patient test results.  I agree with the assessment, diagnosis, and plan of care documented in the resident's note.  

## 2020-09-14 ENCOUNTER — Telehealth: Payer: Self-pay | Admitting: Student

## 2020-09-14 NOTE — Telephone Encounter (Signed)
RTC to patient's mother, [name], who states the patient had an appointment with her psychiatrist, Dr. Hoyle Barr today.  Patient asked her psychiatrist for medication to increase her appetite and he told her she needs to get this from her PCP. Patient's mother states at [place] on 08/31/20 (telehealth) they discussed this with the physician who recommended this be discussed with psyc.  They are now asking pcp for RX again. Will send to yellow team to advise. SChaplin, RN,BSN

## 2020-09-14 NOTE — Telephone Encounter (Signed)
Pls contact [place] at [phone]

## 2020-09-14 NOTE — Telephone Encounter (Signed)
TC to patient's mother, [name], and she was informed of Dr. Noni Saupe response below.  Patient's mother states patient is no longer experiencing N&V, but her appetite is still not back to normal.  RN encouraged mother to reach out to Psyc to discuss if lack of appetite is d/t uncontrolled schizophrenia since patient had stopped meds.  RN told mother it may take some time before her appetite returns to normal.   Informed mother if lack of appetite worsens after discussing w/ pscy, patient would need an in person appt for evaluation.  She verbalized understanding. SChaplin, RN,BSN

## 2020-09-14 NOTE — Telephone Encounter (Signed)
Hello, we discussed that lack of appetite was present with nausea and vomiting in the setting of anti-psychotic discontinuation by the patient. When she restarted the medication, symptoms improved. I suspect the lack of appetite is secondary to uncontrolled schizophrenia. Due to this, I recommended she discuss with Psychiatry.    Thank you!

## 2020-09-20 ENCOUNTER — Ambulatory Visit (INDEPENDENT_AMBULATORY_CARE_PROVIDER_SITE_OTHER): Payer: Medicare Other | Admitting: Internal Medicine

## 2020-09-20 ENCOUNTER — Encounter: Payer: Self-pay | Admitting: Internal Medicine

## 2020-09-20 ENCOUNTER — Other Ambulatory Visit: Payer: Self-pay

## 2020-09-20 VITALS — BP 106/79 | HR 106 | Temp 98.0°F | Ht 65.0 in | Wt 162.7 lb

## 2020-09-20 DIAGNOSIS — I10 Essential (primary) hypertension: Secondary | ICD-10-CM | POA: Diagnosis not present

## 2020-09-20 DIAGNOSIS — R112 Nausea with vomiting, unspecified: Secondary | ICD-10-CM | POA: Diagnosis not present

## 2020-09-20 DIAGNOSIS — E119 Type 2 diabetes mellitus without complications: Secondary | ICD-10-CM

## 2020-09-20 DIAGNOSIS — R000 Tachycardia, unspecified: Secondary | ICD-10-CM | POA: Diagnosis not present

## 2020-09-20 DIAGNOSIS — Z794 Long term (current) use of insulin: Secondary | ICD-10-CM

## 2020-09-20 LAB — POCT GLYCOSYLATED HEMOGLOBIN (HGB A1C): Hemoglobin A1C: 10.1 % — AB (ref 4.0–5.6)

## 2020-09-20 LAB — GLUCOSE, CAPILLARY: Glucose-Capillary: 340 mg/dL — ABNORMAL HIGH (ref 70–99)

## 2020-09-20 NOTE — Progress Notes (Signed)
   CC: F/u DM, HTN  HPI:  [name] is a 55 y.o. with medical history significant for type 2 diabetes mellitus, schizoaffective disorder, hypertension here to follow-up on chronic medical problems.  Please see problem based charting for further details.  Past Medical History:  Diagnosis Date  . Alcohol abuse   . Anemia, iron deficiency   . Arteriosclerotic cardiovascular disease (ASCVD)    Minimal at cath in [facility].stress nuclear study in 8/08 with nl EF; neg stress echo in 2010  . Community acquired pneumonia 01/03/10, 05/2010, 04/2012   2011; with pleural effusion-[facility] acute resp failure; intubated in Jan 2014 (HMPV pneumonia)  . Depression   . Diabetes mellitus, type 2 ([place]) 2000   Onset in 2000; no insulin  . Diarrhea 10/14/2013   Started 10/10/13, improved with Imodium.   . Diastolic dysfunction    grade 2 per echo 2011  . Dysphagia   . Gastroesophageal reflux disease    Schatzki's ring  . History of alcohol abuse 07/22/2007   Qualifier: Diagnosis of  By: Lenn Cal    . Hyperlipidemia   . Hypertension `   during treatment with Geodon  . Hypokalemia 12/25/2013  . Left knee pain 08/25/2014  . Obesity   . Oral candidiasis 05/16/2017  . PTSD (post-traumatic stress disorder)   . Pulmonary hypertension ([place]) 05/02/2012   Patient needs repeat echo in 06/2012   . Schizoaffective disorder    requiring multiple psychiatric admissions  . Viral URI 05/12/2013  . Viral wart on finger 10/08/2016  . Vision changes 08/12/2014   Review of Systems:  As per HPI  Physical Exam:  Vitals:   09/20/20 1000  BP: 106/79  Pulse: (!) 116  Temp: 98 F (36.7 C)  TempSrc: Oral  SpO2: 98%  Weight: 162 lb 11.2 oz (73.8 kg)  Height: 5\' 5"  (1.651 m)   Physical Exam Cardiovascular:     Rate and Rhythm: Tachycardia present.     Heart sounds: Normal heart sounds.  Pulmonary:     Breath sounds: Normal breath sounds. No rales.  Neurological:     Mental Status: She  is alert.  Psychiatric:        Mood and Affect: Affect is blunt and flat.        Speech: Speech normal.        Behavior: Behavior is cooperative.     Assessment & Plan:   See Encounters Tab for problem based charting.  Patient discussed with Dr. Evette Doffing

## 2020-09-20 NOTE — Assessment & Plan Note (Signed)
#  Nausea: Patient's mother tells me that for the past 2 months she has been experiencing nausea and due to this she is hesitant on eating.  Even though she is able to keep her food down there are times that she would gag and experience nausea.  Given this, she has lost about 24 pounds since March 2022.  TSH was checked last year and was unremarkable.  Patient drinks boost supplements.  The parents tell me that they have been able to help manage her tobacco use and recently cut back on tobacco use.  Assessment and plan: Given the progressive nausea which has been causing her weight loss, suspecting Trulicity could be playing a role and would thus discontinue Trulicity.  She will continue scaling back on tobacco use and drinking protein supplements.  We will see her back in 3 months recheck her weight.Diagnostic colonoscopy performed in 2013 was significant for right-sided colonic diverticulosis.  Patient's age at 11 precludes her from obtaining low-dose CT of the chest for lung cancer screening.

## 2020-09-20 NOTE — Patient Instructions (Signed)
[name],   Thanks for seeing me today. Here is what we discussed today  1)Please RESUME THE METFORMIN and TAKE LANTUS 7 Units daily. Please do not miss any doses 2)Please continue to check your blood sugars  Take care! Dr. Eileen Stanford  Please call the internal medicine center clinic if you have any questions or concerns, we may be able to help and keep you from a long and expensive emergency room wait. Our clinic and after hours phone number is [phone], the best time to call is Monday through Friday 9 am to 4 pm but there is always someone available 24/7 if you have an emergency. If you need medication refills please notify your pharmacy one week in advance and they will send [name] a request.   If you have not gotten the COVID vaccine, I recommend doing so:  You may get it at your local [facility] or [facility] OR To schedule an appointment for a COVID vaccine or be added to the vaccine wait list: Go to [email or web address]   OR Go to [email or web address]                  OR Call [phone]                                     OR Call [phone] and select Option 2

## 2020-09-20 NOTE — Assessment & Plan Note (Signed)
#  Tachycardia: She was found to be tachycardic 116 today with repeat of 106.  She denies chest pain, palpitation, shortness of breath.  She denies taking any stimulants or drinking coffee.  She does continue to smoke cigarettes which is a concern for her father. Prior EKG showed sinus tachycardia

## 2020-09-20 NOTE — Assessment & Plan Note (Signed)
#  Hypertension: Well-controlled next line Plan: - Continue olmesartan 20 mg daily

## 2020-09-20 NOTE — Assessment & Plan Note (Addendum)
#  Type 2 diabetes mellitus: Last hemoglobin A1c was 10.2% in March 2022.  Patient's mother and father takes care of her at home and the father makes me aware that she has not been checking her blood sugars at home.  The patient states that she lost interest in checking her blood sugars.  Hemoglobin A1c today is 10.1  I was able to speak to the patient, her mother and father at length emphasizing on the importance of medication adherence and the long-term consequences of uncontrolled diabetes.  Anetria did assure me that from now onwards, she would be diligent in taking her medicines.  Plan: - Discontinue Trulicity 7.82 mg weekly (2/2 Adverse effect of nausea and weight loss)  - Increase Metformin to 1000 mg BID - Resume Lantus 7 units nightly - RTC in 3 months

## 2020-09-21 ENCOUNTER — Other Ambulatory Visit: Payer: Self-pay | Admitting: Internal Medicine

## 2020-09-21 DIAGNOSIS — E119 Type 2 diabetes mellitus without complications: Secondary | ICD-10-CM

## 2020-09-21 MED ORDER — METFORMIN HCL 1000 MG PO TABS: 1000.0000 mg | ORAL_TABLET | Freq: Two times a day (BID) | ORAL | 11 refills | Status: AC

## 2020-09-23 ENCOUNTER — Other Ambulatory Visit: Payer: Self-pay | Admitting: *Deleted

## 2020-09-23 DIAGNOSIS — Z794 Long term (current) use of insulin: Secondary | ICD-10-CM

## 2020-09-23 MED ORDER — ACCU-CHEK SOFTCLIX LANCETS MISC: 12 refills | Status: AC

## 2020-09-23 NOTE — Telephone Encounter (Signed)
Re-send rx with directions/ how often pt checks her BS (cannot  use"Use as instructed"

## 2020-09-26 NOTE — Progress Notes (Signed)
Internal Medicine Clinic Attending  Case discussed with Dr. Agyei  At the time of the visit.  We reviewed the resident's history and exam and pertinent patient test results.  I agree with the assessment, diagnosis, and plan of care documented in the resident's note.  

## 2020-10-18 ENCOUNTER — Encounter: Payer: Self-pay | Admitting: *Deleted

## 2020-10-21 ENCOUNTER — Telehealth: Payer: Self-pay

## 2020-10-21 NOTE — Telephone Encounter (Signed)
Thank you both for your help with [name]'s care. I will attempt to give her a call next week and discuss her meeting with Dr. Georgina Peer. I think she would overall benefit from this, but will give her the weekend to think as it seems as though she was quite upset.

## 2020-10-21 NOTE — Telephone Encounter (Signed)
Received TC from patient who states "My mother put me on a sliding scale of insulin and I have had to take more insulin because of this.  Now I need a RX of insulin sent to the pharmacy".  RN reviewed patient's chart and noted she is only on Lantus, RN asked patient if this was correct and she states "yes".  Pt states she has been taking more of the Lantus based on the SS her mother put her on and sometimes she takes as much as 14 units a night.  Pt reports her sugars ar "2 hundred and something when she does this and afterwards they are really good, about 130".  RN informed patient that if MD decides the current prescribed Lantus dose is not controlling her blood sugars, MD may decide to increase current dose of Lantus or prescribe another type of medication/insulin.  [name] is out today.  RN encouraged patient to set up a telephone appt with clinical pharmacist this morning, as she has an opening @ 1000 and patient refuses, states "I don't need anybody to call me about this, I just need you to send a message to my doctor that I need him to call in more Lantus".  Forwarding to PCP. SChaplin, RN,BSN

## 2020-10-21 NOTE — Telephone Encounter (Signed)
After speaking with Dr. Johnney Ou I reached out to the patient to discuss dosing of insulin and her request of needing more Lantus called in to the pharmacy. I discussed with patient that she should present back to the clinic to discuss insulin regimen and review her blood glucose readings in order to properly dose her Lantus. I mentioned I would be more than happy to schedule an appointment for the patient. Patient immediately stated "No, absolutely not. I come in every 3 months to get my sugars checked and I am not coming in any sooner unless someone drags me in there." She then hung up the phone.

## 2020-10-26 ENCOUNTER — Encounter: Payer: Self-pay | Admitting: *Deleted

## 2020-10-26 NOTE — Progress Notes (Signed)

## 2020-10-28 NOTE — Progress Notes (Signed)
Things That May Be Affecting Your Health:  Alcohol  Hearing loss  Pain   x Depression  Home Safety  Sexual Health   Diabetes  Lack of physical activity x Stress   Difficulty with daily activities  Loneliness  Tiredness   Drug use  Medicines x Tobacco use   Falls  Motor Vehicle Safety  Weight  x Food choices  Oral Health  Other    YOUR PERSONALIZED HEALTH PLAN : 1. Schedule your next subsequent Medicare Wellness visit in one year 2. Attend all of your regular appointments to address your medical issues 3. Complete the preventative screenings and services   Annual Wellness Visit   Medicare Covered Preventative Screenings and [place] Men and Women Who How Often Need? Date of Last Service Action  Abdominal Aortic Aneurysm Adults with AAA risk factors Once      Alcohol Misuse and Counseling All Adults Screening once a year if no alcohol misuse. Counseling up to 4 face to face sessions.     Bone Density Measurement  Adults at risk for osteoporosis Once every 2 yrs      Lipid Panel Z13.6 All adults without CV disease Once every 5 yrs       Colorectal Cancer  Stool sample or Colonoscopy All adults 14 and older  Once every year Every 10 years        Depression All Adults Once a year  Today   Diabetes Screening Blood glucose, post glucose load, or GTT Z13.1 All adults at risk Pre-diabetics Once per year Twice per year   Microalbumin Urine   Diabetes  Self-Management Training All adults Diabetics 10 hrs first year; 2 hours subsequent years. Requires Copay     Glaucoma Diabetics Family history of glaucoma African Americans 65 yrs + Hispanic Americans 49 yrs + Annually - requires coppay      Hepatitis C Z72.89 or F19.20 High Risk for HCV Born between 1945 and 1965 Annually Once      HIV Z11.4 All adults based on risk Annually btw ages 55 & 11 regardless of risk Annually > 65 yrs if at increased risk      Lung Cancer Screening Asymptomatic adults  aged 56-77 with 22 pack yr history and current smoker OR quit within the last 15 yrs Annually Must have counseling and shared decision making documentation before first screen      Medical Nutrition Therapy Adults with  Diabetes Renal disease Kidney transplant within past 3 yrs 3 hours first year; 2 hours subsequent years     Obesity and Counseling All adults Screening once a year Counseling if BMI 30 or higher  Today   Tobacco Use Counseling Adults who use tobacco  Up to 8 visits in one year     Vaccines Z23 Hepatitis B Influenza  Pneumonia  Adults  Once Once every flu season Two different vaccines separated by one year  X Pneumonia, COVID, Zoster   Next Annual Wellness Visit People with Medicare Every year  Today     Services & Screenings Women Who How Often Need  Date of Last Service Action  Mammogram  Z12.31 Women over 80 One baseline ages 47-39. Annually ager 40 yrs+      Pap tests All women Annually if high risk. Every 2 yrs for normal risk women       X  Screening for cervical cancer with  Pap (Z01.419 nl or Z01.411abnl) & HPV Z11.51 Women aged 24 to 66 Once  every 5 yrs     Screening pelvic and breast exams All women Annually if high risk. Every 2 yrs for normal risk women     Sexually Transmitted Diseases Chlamydia Gonorrhea Syphilis All at risk adults Annually for non pregnant females at increased risk         [place] Men Who How Ofter Need  Date of Last Service Action  Prostate Cancer - DRE & PSA Men over 50 Annually.  DRE might require a copay.        Sexually Transmitted Diseases Syphilis All at risk adults Annually for men at increased risk      Health Maintenance List Health Maintenance  Topic Date Due   COVID-19 Vaccine (1) Never done   Zoster Vaccines- Shingrix (1 of 2) Never done   Pneumococcal Vaccine 65-38 Years old (3 - PCV) 10/15/2013   URINE MICROALBUMIN  12/13/2015   PAP SMEAR-Modifier  01/17/2016   OPHTHALMOLOGY EXAM   10/10/2017   MAMMOGRAM  01/14/2018   INFLUENZA VACCINE  11/14/2020   HEMOGLOBIN A1C  12/21/2020   FOOT EXAM  02/15/2021   LIPID PANEL  06/20/2021   TETANUS/TDAP  10/03/2021   COLONOSCOPY (Pts 45-57yrs Insurance coverage will need to be confirmed)  01/09/2022   PNEUMOCOCCAL POLYSACCHARIDE VACCINE AGE 70-64 HIGH RISK  Completed   Hepatitis C Screening  Completed   HIV Screening  Completed   HPV VACCINES  Aged Out

## 2020-11-08 ENCOUNTER — Telehealth: Payer: Self-pay | Admitting: *Deleted

## 2020-11-08 NOTE — Telephone Encounter (Signed)
SPOKE WITH PATIENT'S MOM REGARDING HER PCS PAPER WORK.  PCS FORMS FAXED TO [facility]. [place]   [phone]   / (FAX) [phone] 13:28 pm /confirmation sheet.   Patient informed that someone from office will contact her at home.

## 2020-12-20 ENCOUNTER — Ambulatory Visit (INDEPENDENT_AMBULATORY_CARE_PROVIDER_SITE_OTHER): Payer: Medicare Other | Admitting: Podiatry

## 2020-12-20 ENCOUNTER — Other Ambulatory Visit: Payer: Self-pay

## 2020-12-20 DIAGNOSIS — Q828 Other specified congenital malformations of skin: Secondary | ICD-10-CM | POA: Diagnosis not present

## 2020-12-20 DIAGNOSIS — M79674 Pain in right toe(s): Secondary | ICD-10-CM

## 2020-12-20 DIAGNOSIS — M79675 Pain in left toe(s): Secondary | ICD-10-CM | POA: Diagnosis not present

## 2020-12-20 DIAGNOSIS — B351 Tinea unguium: Secondary | ICD-10-CM | POA: Diagnosis not present

## 2020-12-20 DIAGNOSIS — M7742 Metatarsalgia, left foot: Secondary | ICD-10-CM

## 2020-12-20 DIAGNOSIS — E1121 Type 2 diabetes mellitus with diabetic nephropathy: Secondary | ICD-10-CM

## 2020-12-20 DIAGNOSIS — M216X2 Other acquired deformities of left foot: Secondary | ICD-10-CM | POA: Diagnosis not present

## 2020-12-20 NOTE — Progress Notes (Signed)
  Subjective:  Patient ID: [name], female    DOB: 11/04/1965,  MRN: [id number]  Chief Complaint  Patient presents with   Diabetic Ulcer     URGENT WORK IN-left foot sore on the bottom of foot-diabetic, A1C  10.1    55 y.o. female presents with the above complaint. History confirmed with patient.  She is a large painful callus that is preulcerative on the left foot.  There is a recurrent issue for her.  Her A1c is elevated and was around 10%.  She also has thickened elongated nails x10  Objective:  Physical Exam: warm, good capillary refill, no trophic changes or ulcerative lesions, normal DP and PT pulses, and protective sensation she has a prominent plantarflexed fifth metatarsal with large preulcerative callus here as well with no deep wound under after debridement, she has thickened elongated nails x10 with yellow-brown discoloration. Assessment:   1. Porokeratosis   2. Controlled type 2 diabetes mellitus with diabetic nephropathy, unspecified whether long term insulin use ([place])   3. Metatarsalgia, left foot   4. Plantar flexed metatarsal, left      Plan:  Patient was evaluated and treated and all questions answered.  Patient educated on diabetes. Discussed proper diabetic foot care and discussed risks and complications of disease. Educated patient in depth on reasons to return to the office immediately should he/she discover anything concerning or new on the feet. All questions answered. Discussed proper shoes as well.  I think she would benefit from extra-depth diabetic shoes with a custom molded insole and I had her scheduled for fitting for these  All symptomatic hyperkeratoses were safely debrided with a sterile #15 blade to patient's level of comfort without incident. We discussed preventative and palliative care of these lesions including supportive and accommodative shoegear, padding, prefabricated and custom molded accommodative orthoses, use of a pumice stone and  lotions/creams daily.  Discussed the etiology and treatment options for the condition in detail with the patient. Educated patient on the topical and oral treatment options for mycotic nails. Recommended debridement of the nails today. Sharp and mechanical debridement performed of all painful and mycotic nails today. Nails debrided in length and thickness using a nail nipper to level of comfort. Discussed treatment options including appropriate shoe gear. Follow up as needed for painful nails.    Return if symptoms worsen or fail to improve.

## 2020-12-28 ENCOUNTER — Other Ambulatory Visit: Payer: Self-pay

## 2020-12-28 ENCOUNTER — Ambulatory Visit (INDEPENDENT_AMBULATORY_CARE_PROVIDER_SITE_OTHER): Payer: Medicare Other | Admitting: *Deleted

## 2020-12-28 DIAGNOSIS — M216X2 Other acquired deformities of left foot: Secondary | ICD-10-CM

## 2020-12-28 DIAGNOSIS — E1121 Type 2 diabetes mellitus with diabetic nephropathy: Secondary | ICD-10-CM

## 2020-12-28 DIAGNOSIS — M7742 Metatarsalgia, left foot: Secondary | ICD-10-CM

## 2020-12-28 NOTE — Progress Notes (Signed)
Patient presents to the office today for diabetic shoe and insole measuring.  Patient was measured with brannock device to determine size and width for 1 pair of extra depth shoes and foam casted for 3 pair of insoles.   Documentation of medical necessity will be sent to patient's treating diabetic doctor to verify and sign.   Patient's diabetic provider: Dr. Riesa Pope  Shoes and insoles will be ordered at that time and patient will be notified for an appointment for fitting when they arrive.   Shoe size (per patient): 7.5   Brannock measurement: RIGHT - 8 B, LEFT - 8.5 B  Patient shoe selection-   1st choice:   Apex [id number]  2nd choice:  Apex [id number]  Shoe size ordered: Women's 8.5 Meduim

## 2021-01-10 ENCOUNTER — Encounter: Payer: Medicare Other | Admitting: Internal Medicine

## 2021-03-16 ENCOUNTER — Other Ambulatory Visit (HOSPITAL_COMMUNITY): Payer: Self-pay | Admitting: Family Medicine

## 2021-03-16 DIAGNOSIS — Z1231 Encounter for screening mammogram for malignant neoplasm of breast: Secondary | ICD-10-CM

## 2021-05-15 ENCOUNTER — Other Ambulatory Visit: Payer: Self-pay

## 2021-06-13 ENCOUNTER — Other Ambulatory Visit: Payer: Self-pay

## 2021-06-13 ENCOUNTER — Ambulatory Visit (INDEPENDENT_AMBULATORY_CARE_PROVIDER_SITE_OTHER): Payer: Medicare Other | Admitting: Podiatry

## 2021-06-13 DIAGNOSIS — M79674 Pain in right toe(s): Secondary | ICD-10-CM | POA: Diagnosis not present

## 2021-06-13 DIAGNOSIS — B351 Tinea unguium: Secondary | ICD-10-CM

## 2021-06-13 DIAGNOSIS — E1121 Type 2 diabetes mellitus with diabetic nephropathy: Secondary | ICD-10-CM | POA: Diagnosis not present

## 2021-06-13 DIAGNOSIS — M79675 Pain in left toe(s): Secondary | ICD-10-CM

## 2021-06-13 DIAGNOSIS — E1165 Type 2 diabetes mellitus with hyperglycemia: Secondary | ICD-10-CM

## 2021-06-13 DIAGNOSIS — M7742 Metatarsalgia, left foot: Secondary | ICD-10-CM

## 2021-06-13 DIAGNOSIS — L84 Corns and callosities: Secondary | ICD-10-CM

## 2021-06-13 NOTE — Patient Instructions (Signed)
Look for urea 40% cream or ointment and apply to the thickened dry skin / calluses. This can be bought over the counter, at a pharmacy or online such as [employer].  

## 2021-06-13 NOTE — Progress Notes (Signed)
°  Subjective:  Patient ID: [name], female    DOB: [birth year],  MRN: [id number]  Chief Complaint  Patient presents with   Callouses     L sore on bottom of foot again     56 y.o. female presents with the above complaint. History confirmed with patient.  She has a large painful callus that is preulcerative on the left foot.  There is a recurrent issue for her.  Last debridement was helpful but it is returned.  Not putting any cream on it currently.  She was measured for diabetic shoes which changed PCPs so has not received the paperwork yet.  A1c remains elevated at 10.1%.  She also has thickened elongated nails x10  Objective:  Physical Exam: warm, good capillary refill, no trophic changes or ulcerative lesions, normal DP and PT pulses, and protective sensation she has a prominent plantarflexed fifth metatarsal with large preulcerative callus here as well with no deep wound under after debridement, she has thickened elongated nails x10 with yellow-brown discoloration. Assessment:   1. Pre-ulcerative calluses   2. Uncontrolled type 2 diabetes mellitus with hyperglycemia ([place])   3. Metatarsalgia, left foot   4. Pain due to onychomycosis of toenails of both feet       Plan:  Patient was evaluated and treated and all questions answered.  Patient educated on diabetes. Discussed proper diabetic foot care and discussed risks and complications of disease. Educated patient in depth on reasons to return to the office immediately should he/she discover anything concerning or new on the feet. All questions answered. Discussed proper shoes as well.  She was fitted for to her diabetic shoes but changed PCPs.  She will take the paperwork today to her PCP, Meade Maw, DO to fill this out.  All symptomatic hyperkeratoses were safely debrided with a sterile #15 blade to patient's level of comfort without incident. We discussed preventative and palliative care of these lesions including  supportive and accommodative shoegear, padding, prefabricated and custom molded accommodative orthoses, use of a pumice stone and lotions/creams daily.  Discussed the etiology and treatment options for the condition in detail with the patient. Educated patient on the topical and oral treatment options for mycotic nails. Recommended debridement of the nails today. Sharp and mechanical debridement performed of all painful and mycotic nails today. Nails debrided in length and thickness using a nail nipper to level of comfort. Discussed treatment options including appropriate shoe gear. Follow up as needed for painful nails.    Return if symptoms worsen or fail to improve.

## 2021-07-06 ENCOUNTER — Encounter: Payer: Self-pay | Admitting: Obstetrics & Gynecology

## 2021-07-06 ENCOUNTER — Other Ambulatory Visit (HOSPITAL_COMMUNITY)
Admission: RE | Admit: 2021-07-06 | Discharge: 2021-07-06 | Disposition: A | Discharge status: Home / Self Care | Payer: Medicare Other | Source: Ambulatory Visit | Attending: Obstetrics & Gynecology | Admitting: Obstetrics & Gynecology

## 2021-07-06 ENCOUNTER — Other Ambulatory Visit: Payer: Self-pay

## 2021-07-06 ENCOUNTER — Ambulatory Visit (INDEPENDENT_AMBULATORY_CARE_PROVIDER_SITE_OTHER): Payer: Medicare Other | Admitting: Obstetrics & Gynecology

## 2021-07-06 VITALS — BP 154/86 | HR 74 | Ht 64.0 in | Wt 193.0 lb

## 2021-07-06 DIAGNOSIS — Z01419 Encounter for gynecological examination (general) (routine) without abnormal findings: Secondary | ICD-10-CM

## 2021-07-06 DIAGNOSIS — Z1231 Encounter for screening mammogram for malignant neoplasm of breast: Secondary | ICD-10-CM | POA: Diagnosis not present

## 2021-07-06 DIAGNOSIS — Z1151 Encounter for screening for human papillomavirus (HPV): Secondary | ICD-10-CM | POA: Insufficient documentation

## 2021-07-06 NOTE — Patient Instructions (Signed)
Please schedule a mammogram at one of the following locations:  [place]: [phone]  [facility] [place]:[phone] [address]  

## 2021-07-06 NOTE — Progress Notes (Signed)
? ?WELL-WOMAN EXAMINATION ?Patient name: [name] MRN [id number]  Date of birth: [birth year] ?Chief Complaint:   ?Vaginal Prolapse and New Patient (Initial Visit) ? ?History of Present Illness:   ?[name] is a 56 y.o. [phone] PM female being seen today for a routine well-woman exam and the following concerns: ? ?-Vaginal concern: When she washes down there feels swollen and rough.  Past few weeks noticed this issue.  No vaginal discharge, itching.  Slight odor.  Denies pelvic or abdominal pain.  Denies dysuria, frequency, denies incontinence.  No other acute concerns ?Not currently sexually active ? ?No LMP recorded (lmp unknown). Patient is postmenopausal. ? ?Last pap not sure.  ?Last mammogram: not sure. ?Last colonoscopy: pt thinks in the past few yrs ? ? ?  07/06/2021  ?  9:24 AM 09/20/2020  ? 10:05 AM 06/20/2020  ?  9:57 AM 06/15/2020  ?  1:59 PM 04/11/2020  ?  1:45 AM  ?Depression screen PHQ 2/9  ?Decreased Interest 0 0 0 0 0  ?Down, Depressed, Hopeless 0 0 0 0 1  ?PHQ - 2 Score 0 0 0 0 1  ?Altered sleeping 0 0 0 0 1  ?Tired, decreased energy 0 0 '1 1 2  '$ ?Change in appetite 2 0 0 0 0  ?Feeling bad or failure about yourself  0 0 0 0 0  ?Trouble concentrating 0 0 0 0 1  ?Moving slowly or fidgety/restless 0 0 1 0 1  ?Suicidal thoughts 0 0 0 0 0  ?PHQ-9 Score 2 0 '2 1 6  '$ ?Difficult doing work/chores  Not difficult at all Somewhat difficult Not difficult at all Somewhat difficult  ? ? ? ? ?Review of Systems:   ?Pertinent items are noted in HPI ?Denies any headaches, blurred vision, fatigue, shortness of breath, chest pain, abdominal pain, bowel movements, urination, or intercourse unless otherwise stated above. ? ?Pertinent History Reviewed:  ?Reviewed past medical,surgical, social and family history.  ?Reviewed problem list, medications and allergies. ?Physical Assessment:  ? ?Vitals:  ? 07/06/21 0920 07/06/21 0932  ?BP: (!) 176/88 (!) 154/86  ?Pulse: 80 74  ?Weight: 193 lb (87.5 kg)   ?Height: '5\' 4"'$   (1.626 m)   ?Body mass index is 33.13 kg/m?. ?  ?     Physical Examination:  ? General appearance - well appearing, and in no distress ? Mental status - alert, oriented to person, place, and time ? Psych:  She has a normal mood and affect ? Skin - warm and dry, normal color, no suspicious lesions noted ? Chest - effort normal, all lung fields clear to auscultation bilaterally ? Heart - normal rate and regular rhythm ? Neck:  midline trachea, no thyromegaly or nodules ? Breasts - breasts appear normal, no suspicious masses, no skin or nipple changes or  axillary nodes ? Abdomen - soft, nontender, nondistended, no masses or organomegaly ? Pelvic - VULVA: normal appearing vulva with no masses, tenderness or lesions  VAGINA: normal appearing vagina with normal color and discharge, no lesions, CERVIX: normal appearing cervix without discharge or lesions, no CMT.  No prolapse appreciated, no rectocele, prominent anterior rugae noted, Stage 1 cystocele. ? Thin prep pap is done with HR HPV cotesting ? UTERUS: uterus is felt to be normal size, shape, consistency and nontender  ? ADNEXA: No adnexal masses or tenderness noted. ? Extremities:  No swelling or varicosities noted, no calf tenderness bilaterally ? ?Chaperone: Marcelino Scot   ? ? ?Assessment & Plan:  ?  1) Well-Woman Exam ?-pap collected ?-mammogram ordered ? ?2) Stage 1 cystocele ?-minimal prolapse noted ?-reviewed pelvic floor therapy on her own ? ? ?Meds: No orders of the defined types were placed in this encounter. ? ? ?Follow-up: Return in about 1 year (around 07/07/2022) for please print AVS, Annual. ? ? ?Janyth Pupa, DO ?Attending [facility] ?[facility] ? ? ?

## 2021-07-10 LAB — CYTOLOGY - PAP
Adequacy: ABSENT
Comment: NEGATIVE
Diagnosis: NEGATIVE
High risk HPV: NEGATIVE

## 2021-07-13 ENCOUNTER — Telehealth: Payer: Self-pay

## 2021-07-13 NOTE — Telephone Encounter (Signed)
Shoes Ordered -  ?[employer] - [id number] - Financial trader - Black 7.80M ?

## 2021-08-21 DIAGNOSIS — N8110 Cystocele, unspecified: Secondary | ICD-10-CM | POA: Diagnosis not present

## 2021-08-21 DIAGNOSIS — E785 Hyperlipidemia, unspecified: Secondary | ICD-10-CM | POA: Diagnosis not present

## 2021-08-21 DIAGNOSIS — F17200 Nicotine dependence, unspecified, uncomplicated: Secondary | ICD-10-CM | POA: Diagnosis not present

## 2021-08-21 DIAGNOSIS — I272 Pulmonary hypertension, unspecified: Secondary | ICD-10-CM | POA: Diagnosis not present

## 2021-08-21 DIAGNOSIS — N189 Chronic kidney disease, unspecified: Secondary | ICD-10-CM | POA: Diagnosis not present

## 2021-08-21 DIAGNOSIS — Z1211 Encounter for screening for malignant neoplasm of colon: Secondary | ICD-10-CM | POA: Diagnosis not present

## 2021-08-21 DIAGNOSIS — K219 Gastro-esophageal reflux disease without esophagitis: Secondary | ICD-10-CM | POA: Diagnosis not present

## 2021-08-21 DIAGNOSIS — D509 Iron deficiency anemia, unspecified: Secondary | ICD-10-CM | POA: Diagnosis not present

## 2021-08-21 DIAGNOSIS — E1165 Type 2 diabetes mellitus with hyperglycemia: Secondary | ICD-10-CM | POA: Diagnosis not present

## 2021-08-30 ENCOUNTER — Other Ambulatory Visit: Payer: Self-pay | Admitting: Student

## 2021-08-30 DIAGNOSIS — E785 Hyperlipidemia, unspecified: Secondary | ICD-10-CM

## 2021-08-30 NOTE — Telephone Encounter (Signed)
Not a Clinic patient.   

## 2021-08-31 NOTE — Telephone Encounter (Signed)
In patient's comments it is noted that she transferred her care effective 12/2020.  Forwarding back to triag and Dr. Alfonse Spruce.

## 2021-09-12 DIAGNOSIS — B078 Other viral warts: Secondary | ICD-10-CM | POA: Diagnosis not present

## 2021-09-13 ENCOUNTER — Encounter: Payer: Self-pay | Admitting: *Deleted

## 2021-09-26 DIAGNOSIS — J029 Acute pharyngitis, unspecified: Secondary | ICD-10-CM | POA: Diagnosis not present

## 2021-09-26 DIAGNOSIS — R0989 Other specified symptoms and signs involving the circulatory and respiratory systems: Secondary | ICD-10-CM | POA: Diagnosis not present

## 2021-09-26 DIAGNOSIS — R059 Cough, unspecified: Secondary | ICD-10-CM | POA: Diagnosis not present

## 2021-09-26 DIAGNOSIS — J069 Acute upper respiratory infection, unspecified: Secondary | ICD-10-CM | POA: Diagnosis not present

## 2021-10-24 ENCOUNTER — Encounter: Payer: Self-pay | Admitting: *Deleted

## 2021-10-24 NOTE — Patient Instructions (Signed)
Patient marked having diarrhea on triage colonoscopy questionnaire. Needs OV.

## 2021-10-25 ENCOUNTER — Encounter: Payer: Self-pay | Admitting: Internal Medicine

## 2021-12-19 ENCOUNTER — Ambulatory Visit: Payer: Medicare Other | Admitting: Gastroenterology

## 2021-12-20 DIAGNOSIS — M25512 Pain in left shoulder: Secondary | ICD-10-CM | POA: Diagnosis not present

## 2022-01-05 ENCOUNTER — Telehealth: Payer: Self-pay | Admitting: Obstetrics & Gynecology

## 2022-01-05 NOTE — Telephone Encounter (Signed)
Called patient back and heard message that phone is not in service.

## 2022-01-05 NOTE — Telephone Encounter (Signed)
Pt is requesting a call about her results and medication for a yeast infection

## 2022-01-24 DIAGNOSIS — E1165 Type 2 diabetes mellitus with hyperglycemia: Secondary | ICD-10-CM | POA: Diagnosis not present

## 2022-01-24 DIAGNOSIS — E785 Hyperlipidemia, unspecified: Secondary | ICD-10-CM | POA: Diagnosis not present

## 2022-01-30 DIAGNOSIS — K219 Gastro-esophageal reflux disease without esophagitis: Secondary | ICD-10-CM | POA: Diagnosis not present

## 2022-01-30 DIAGNOSIS — N3000 Acute cystitis without hematuria: Secondary | ICD-10-CM | POA: Diagnosis not present

## 2022-01-30 DIAGNOSIS — N8110 Cystocele, unspecified: Secondary | ICD-10-CM | POA: Diagnosis not present

## 2022-01-30 DIAGNOSIS — E1165 Type 2 diabetes mellitus with hyperglycemia: Secondary | ICD-10-CM | POA: Diagnosis not present

## 2022-01-30 DIAGNOSIS — I272 Pulmonary hypertension, unspecified: Secondary | ICD-10-CM | POA: Diagnosis not present

## 2022-01-30 DIAGNOSIS — D509 Iron deficiency anemia, unspecified: Secondary | ICD-10-CM | POA: Diagnosis not present

## 2022-01-30 DIAGNOSIS — E785 Hyperlipidemia, unspecified: Secondary | ICD-10-CM | POA: Diagnosis not present

## 2022-01-30 DIAGNOSIS — N189 Chronic kidney disease, unspecified: Secondary | ICD-10-CM | POA: Diagnosis not present

## 2022-01-30 DIAGNOSIS — Z1211 Encounter for screening for malignant neoplasm of colon: Secondary | ICD-10-CM | POA: Diagnosis not present

## 2022-01-30 DIAGNOSIS — Z23 Encounter for immunization: Secondary | ICD-10-CM | POA: Diagnosis not present

## 2022-01-30 DIAGNOSIS — F17200 Nicotine dependence, unspecified, uncomplicated: Secondary | ICD-10-CM | POA: Diagnosis not present

## 2022-02-08 ENCOUNTER — Telehealth: Payer: Self-pay | Admitting: Podiatry

## 2022-02-08 NOTE — Telephone Encounter (Signed)
Pt called asking about her Diabetic shoes.

## 2022-02-09 ENCOUNTER — Encounter: Payer: Self-pay | Admitting: *Deleted

## 2022-02-15 ENCOUNTER — Ambulatory Visit (INDEPENDENT_AMBULATORY_CARE_PROVIDER_SITE_OTHER): Payer: Medicare Other | Admitting: Podiatry

## 2022-02-15 DIAGNOSIS — E119 Type 2 diabetes mellitus without complications: Secondary | ICD-10-CM | POA: Diagnosis not present

## 2022-02-15 DIAGNOSIS — B351 Tinea unguium: Secondary | ICD-10-CM | POA: Diagnosis not present

## 2022-02-15 DIAGNOSIS — M79674 Pain in right toe(s): Secondary | ICD-10-CM

## 2022-02-15 DIAGNOSIS — M79675 Pain in left toe(s): Secondary | ICD-10-CM | POA: Diagnosis not present

## 2022-02-15 DIAGNOSIS — E1165 Type 2 diabetes mellitus with hyperglycemia: Secondary | ICD-10-CM

## 2022-02-15 DIAGNOSIS — M7742 Metatarsalgia, left foot: Secondary | ICD-10-CM | POA: Diagnosis not present

## 2022-02-15 DIAGNOSIS — L84 Corns and callosities: Secondary | ICD-10-CM | POA: Diagnosis not present

## 2022-02-15 MED ORDER — AMMONIUM LACTATE 12 % EX CREA: 1.0000 | TOPICAL_CREAM | CUTANEOUS | 5 refills | Status: AC | PRN

## 2022-02-19 DIAGNOSIS — I272 Pulmonary hypertension, unspecified: Secondary | ICD-10-CM | POA: Diagnosis not present

## 2022-02-19 DIAGNOSIS — I10 Essential (primary) hypertension: Secondary | ICD-10-CM | POA: Diagnosis not present

## 2022-02-19 NOTE — Progress Notes (Signed)
  Subjective:  Patient ID: [name], female    DOB: [birth year],  MRN: [id number]  Chief Complaint  Patient presents with   Diabetes    At risk diabetic foot care, A1C  10.1   Callouses    Painful porokeratosis / pre-ulcerative callus    56 y.o. female presents with the above complaint. History confirmed with patient.  She has a large painful callus that is preulcerative on the left foot.   This is a recurrent issue for her.  Last debridement was helpful but it is returned.  Her nails are also thickened and elongated causing discomfort.  Her A1c remains fairly uncontrolled Objective:  Physical Exam: warm, good capillary refill, no trophic changes or ulcerative lesions, normal DP and PT pulses, and protective sensation she has a prominent plantarflexed fifth metatarsal with large preulcerative callus here as well with no deep wound under after debridement, she has thickened elongated nails x10 with yellow-brown discoloration. Assessment:   1. Encounter for comprehensive diabetic foot examination, type 2 diabetes mellitus ([place])   2. Metatarsalgia, left foot   3. Uncontrolled type 2 diabetes mellitus with hyperglycemia ([place])   4. Pre-ulcerative calluses   5. Pain due to onychomycosis of toenails of both feet        Plan:  Patient was evaluated and treated and all questions answered.   Patient educated on diabetes. Discussed proper diabetic foot care and discussed risks and complications of disease. Educated patient in depth on reasons to return to the office immediately should he/she discover anything concerning or new on the feet. All questions answered. Discussed proper shoes as well.  Annual diabetic risk exam was completed today   All symptomatic hyperkeratoses were safely debrided with a sterile #15 blade to patient's level of comfort without incident. We discussed preventative and palliative care of these lesions including supportive and accommodative shoegear, padding,  prefabricated and custom molded accommodative orthoses, use of a pumice stone and lotions/creams daily.  Discussed the etiology and treatment options for the condition in detail with the patient. Educated patient on the topical and oral treatment options for mycotic nails. Recommended debridement of the nails today. Sharp and mechanical debridement performed of all painful and mycotic nails today. Nails debrided in length and thickness using a nail nipper to level of comfort. Discussed treatment options including appropriate shoe gear. Follow up as needed for painful nails.    Return in about 3 months (around 05/18/2022) for at risk diabetic foot care.

## 2022-05-21 ENCOUNTER — Ambulatory Visit: Payer: Medicaid Other | Admitting: Podiatry

## 2022-05-23 ENCOUNTER — Ambulatory Visit: Payer: Medicare Other

## 2022-05-25 DIAGNOSIS — E1165 Type 2 diabetes mellitus with hyperglycemia: Secondary | ICD-10-CM | POA: Diagnosis not present

## 2022-05-25 DIAGNOSIS — E785 Hyperlipidemia, unspecified: Secondary | ICD-10-CM | POA: Diagnosis not present

## 2022-06-07 DIAGNOSIS — N8110 Cystocele, unspecified: Secondary | ICD-10-CM | POA: Diagnosis not present

## 2022-06-07 DIAGNOSIS — E1165 Type 2 diabetes mellitus with hyperglycemia: Secondary | ICD-10-CM | POA: Diagnosis not present

## 2022-06-07 DIAGNOSIS — Z0000 Encounter for general adult medical examination without abnormal findings: Secondary | ICD-10-CM | POA: Diagnosis not present

## 2022-06-07 DIAGNOSIS — D631 Anemia in chronic kidney disease: Secondary | ICD-10-CM | POA: Diagnosis not present

## 2022-06-07 DIAGNOSIS — E1122 Type 2 diabetes mellitus with diabetic chronic kidney disease: Secondary | ICD-10-CM | POA: Diagnosis not present

## 2022-06-07 DIAGNOSIS — F17210 Nicotine dependence, cigarettes, uncomplicated: Secondary | ICD-10-CM | POA: Diagnosis not present

## 2022-06-07 DIAGNOSIS — I272 Pulmonary hypertension, unspecified: Secondary | ICD-10-CM | POA: Diagnosis not present

## 2022-06-07 DIAGNOSIS — F17200 Nicotine dependence, unspecified, uncomplicated: Secondary | ICD-10-CM | POA: Diagnosis not present

## 2022-06-07 DIAGNOSIS — N189 Chronic kidney disease, unspecified: Secondary | ICD-10-CM | POA: Diagnosis not present

## 2022-06-07 DIAGNOSIS — I10 Essential (primary) hypertension: Secondary | ICD-10-CM | POA: Diagnosis not present

## 2022-06-07 DIAGNOSIS — D509 Iron deficiency anemia, unspecified: Secondary | ICD-10-CM | POA: Diagnosis not present

## 2022-06-07 DIAGNOSIS — I129 Hypertensive chronic kidney disease with stage 1 through stage 4 chronic kidney disease, or unspecified chronic kidney disease: Secondary | ICD-10-CM | POA: Diagnosis not present

## 2022-06-13 ENCOUNTER — Other Ambulatory Visit (HOSPITAL_COMMUNITY): Payer: Self-pay | Admitting: Family Medicine

## 2022-06-13 DIAGNOSIS — Z1231 Encounter for screening mammogram for malignant neoplasm of breast: Secondary | ICD-10-CM

## 2022-06-20 ENCOUNTER — Ambulatory Visit (HOSPITAL_COMMUNITY): Payer: Medicare Other

## 2022-07-16 ENCOUNTER — Encounter: Payer: Self-pay | Admitting: *Deleted

## 2022-07-24 ENCOUNTER — Ambulatory Visit: Payer: Medicaid Other | Admitting: Podiatry

## 2022-08-14 ENCOUNTER — Ambulatory Visit (INDEPENDENT_AMBULATORY_CARE_PROVIDER_SITE_OTHER): Payer: 59 | Admitting: Podiatry

## 2022-08-14 ENCOUNTER — Encounter: Payer: Self-pay | Admitting: Podiatry

## 2022-08-14 DIAGNOSIS — Q828 Other specified congenital malformations of skin: Secondary | ICD-10-CM

## 2022-08-14 DIAGNOSIS — M79675 Pain in left toe(s): Secondary | ICD-10-CM

## 2022-08-14 DIAGNOSIS — E1121 Type 2 diabetes mellitus with diabetic nephropathy: Secondary | ICD-10-CM

## 2022-08-14 DIAGNOSIS — B351 Tinea unguium: Secondary | ICD-10-CM

## 2022-08-14 DIAGNOSIS — M79674 Pain in right toe(s): Secondary | ICD-10-CM | POA: Diagnosis not present

## 2022-08-14 NOTE — Progress Notes (Addendum)
This patient returns to my office for at risk foot care.  This patient requires this care by a professional since this patient will be at risk due to having diabetes.This patient is unable to cut nails herself since the patient cannot reach hernails.These nails are painful walking and wearing shoes.   She has painful callus left foot. This patient presents for at risk foot care today.  General Appearance  Alert, conversant and in no acute stress.  Vascular  Dorsalis pedis and posterior tibial  pulses are palpable  bilaterally.  Capillary return is within normal limits  bilaterally. Temperature is within normal limits  bilaterally.  Neurologic  Senn-Weinstein monofilament wire test within normal limits  bilaterally. Muscle power within normal limits bilaterally.  Nails Thick disfigured discolored nails with subungual debris  from hallux to fifth toes bilaterally. No evidence of bacterial infection or drainage bilaterally.  Orthopedic  No limitations of motion  feet .  No crepitus or effusions noted.  No bony pathology or digital deformities noted. Plantar flexed fifth metatarsal left foot.  Skin  normotropic skin with no porokeratosis noted bilaterally.  No signs of infections or ulcers noted.  Porokeratosis left foot.   Onychomycosis  Pain in right toes  Pain in left toes  Plantar flexed fifth metatarsal left foot.  Consent was obtained for treatment procedures.   Mechanical debridement of nails 1-5  bilaterally performed with a nail nipper.  Filed with dremel without incident. Discussed future surgery for left foot to elevate fifth metatarsal head left foot.  Debrided callus as a courtesty.   Return office visit  3 months                    Told patient to return for periodic foot care and evaluation due to potential at risk complications.   Helane Gunther DPM

## 2022-09-26 DIAGNOSIS — K047 Periapical abscess without sinus: Secondary | ICD-10-CM | POA: Diagnosis not present

## 2022-09-26 DIAGNOSIS — R350 Frequency of micturition: Secondary | ICD-10-CM | POA: Diagnosis not present

## 2022-10-03 DIAGNOSIS — I10 Essential (primary) hypertension: Secondary | ICD-10-CM | POA: Diagnosis not present

## 2022-10-03 DIAGNOSIS — D509 Iron deficiency anemia, unspecified: Secondary | ICD-10-CM | POA: Diagnosis not present

## 2022-10-03 DIAGNOSIS — E1165 Type 2 diabetes mellitus with hyperglycemia: Secondary | ICD-10-CM | POA: Diagnosis not present

## 2022-10-08 DIAGNOSIS — I10 Essential (primary) hypertension: Secondary | ICD-10-CM | POA: Diagnosis not present

## 2022-10-08 DIAGNOSIS — D509 Iron deficiency anemia, unspecified: Secondary | ICD-10-CM | POA: Diagnosis not present

## 2022-10-08 DIAGNOSIS — E785 Hyperlipidemia, unspecified: Secondary | ICD-10-CM | POA: Diagnosis not present

## 2022-10-08 DIAGNOSIS — N189 Chronic kidney disease, unspecified: Secondary | ICD-10-CM | POA: Diagnosis not present

## 2022-10-08 DIAGNOSIS — M545 Low back pain, unspecified: Secondary | ICD-10-CM | POA: Diagnosis not present

## 2022-10-08 DIAGNOSIS — K219 Gastro-esophageal reflux disease without esophagitis: Secondary | ICD-10-CM | POA: Diagnosis not present

## 2022-10-08 DIAGNOSIS — I272 Pulmonary hypertension, unspecified: Secondary | ICD-10-CM | POA: Diagnosis not present

## 2022-10-08 DIAGNOSIS — E1165 Type 2 diabetes mellitus with hyperglycemia: Secondary | ICD-10-CM | POA: Diagnosis not present

## 2022-10-08 DIAGNOSIS — Z1231 Encounter for screening mammogram for malignant neoplasm of breast: Secondary | ICD-10-CM | POA: Diagnosis not present

## 2022-10-08 DIAGNOSIS — N8110 Cystocele, unspecified: Secondary | ICD-10-CM | POA: Diagnosis not present

## 2022-10-08 DIAGNOSIS — I129 Hypertensive chronic kidney disease with stage 1 through stage 4 chronic kidney disease, or unspecified chronic kidney disease: Secondary | ICD-10-CM | POA: Diagnosis not present

## 2022-10-08 DIAGNOSIS — F17200 Nicotine dependence, unspecified, uncomplicated: Secondary | ICD-10-CM | POA: Diagnosis not present

## 2023-02-01 DIAGNOSIS — K047 Periapical abscess without sinus: Secondary | ICD-10-CM | POA: Diagnosis not present

## 2023-02-01 DIAGNOSIS — E1165 Type 2 diabetes mellitus with hyperglycemia: Secondary | ICD-10-CM | POA: Diagnosis not present

## 2023-02-01 DIAGNOSIS — Z794 Long term (current) use of insulin: Secondary | ICD-10-CM | POA: Diagnosis not present

## 2023-02-01 DIAGNOSIS — R300 Dysuria: Secondary | ICD-10-CM | POA: Diagnosis not present

## 2023-02-07 DIAGNOSIS — I10 Essential (primary) hypertension: Secondary | ICD-10-CM | POA: Diagnosis not present

## 2023-02-07 DIAGNOSIS — E1165 Type 2 diabetes mellitus with hyperglycemia: Secondary | ICD-10-CM | POA: Diagnosis not present

## 2023-02-07 DIAGNOSIS — D509 Iron deficiency anemia, unspecified: Secondary | ICD-10-CM | POA: Diagnosis not present

## 2023-04-20 ENCOUNTER — Encounter: Payer: Self-pay | Admitting: Emergency Medicine

## 2023-04-20 ENCOUNTER — Ambulatory Visit
Admission: EM | Admit: 2023-04-20 | Discharge: 2023-04-20 | Disposition: A | Discharge status: Home / Self Care | Payer: 59 | Attending: Nurse Practitioner | Admitting: Nurse Practitioner

## 2023-04-20 DIAGNOSIS — K0889 Other specified disorders of teeth and supporting structures: Secondary | ICD-10-CM

## 2023-04-20 MED ORDER — CLINDAMYCIN HCL 300 MG PO CAPS: 300.0000 mg | ORAL_CAPSULE | Freq: Three times a day (TID) | ORAL | 0 refills | Status: AC

## 2023-04-20 NOTE — ED Triage Notes (Signed)
 Dental pain in lower front area x 1 week.  States having trouble finding a Education officer, community.

## 2023-04-20 NOTE — ED Provider Notes (Signed)
 [facility]    CSN: [id number] Arrival date & time: 04/20/23  1504      History   Chief Complaint No chief complaint on file.   HPI [name] is a 58 y.o. female.   The history is provided by the patient.   Patient presents with front lower dental pain has been present for the past week.  Patient reports that she has several teeth that need fixing.  States that she has had pain with some swelling that has since improved over the past 24 hours.  Patient states pain worsens with eating, talking, and chewing.  She reports that she currently does not have a dentist.  Denies fever, chills, chest pain, abdominal pain, nausea, vomiting, diarrhea, or rash.  Reports she has been taking Tylenol  for pain.  Past Medical History:  Diagnosis Date   Alcohol abuse    Anemia, iron  deficiency    Arteriosclerotic cardiovascular disease (ASCVD)    Minimal at cath in [facility].stress nuclear study in 8/08 with nl EF; neg stress echo in 2010   Community acquired pneumonia 01/03/10, 05/2010, 04/2012   2011; with pleural effusion-[facility] acute resp failure; intubated in Jan 2014 (HMPV pneumonia)   Depression    Diabetes mellitus, type 2 (HCC) 2000   Onset in 2000; no insulin    Diarrhea 10/14/2013   Started 10/10/13, improved with Imodium .    Diastolic dysfunction    grade 2 per echo 2011   Dysphagia    Gastroesophageal reflux disease    Schatzki's ring   History of alcohol abuse 07/22/2007   Qualifier: Diagnosis of  By: Valera Radon     Hyperlipidemia    Hypertension `   during treatment with Geodon    Hypokalemia 12/25/2013   Left knee pain 08/25/2014   Obesity    Oral candidiasis 05/16/2017   PTSD (post-traumatic stress disorder)    Pulmonary hypertension (HCC) 05/02/2012   Patient needs repeat echo in 06/2012    Schizoaffective disorder    requiring multiple psychiatric admissions   Viral URI 05/12/2013   Viral wart on finger 10/08/2016   Vision changes  08/12/2014    Patient Active Problem List   Diagnosis Date Noted   Tachycardia 09/20/2020   Flu-like symptoms 06/15/2020   Manic behavior (HCC)    Psychosis (HCC)    Weight loss 06/15/2019   Yeast infection 06/11/2019   Lithium  toxicity 06/03/2019   Warts 02/19/2019   Bipolar I disorder, most recent episode (or current) manic (HCC) 09/14/2018   Schizoaffective disorder, bipolar type (HCC) 08/26/2017   Healthcare maintenance 04/17/2017   Nuclear sclerosis of both eyes 04/02/2016   Encounter for health education 10/24/2014   IIH (idiopathic intracranial hypertension) 08/12/2014   Seasonal allergies 02/23/2014   Daytime hypersomnolence 12/09/2012   Insomnia 11/06/2012   COPD (chronic obstructive pulmonary disease) (HCC) 10/20/2012   GERD (gastroesophageal reflux disease) 10/15/2012   Preventative health care 10/15/2012   Pulmonary hypertension (HCC) 05/02/2012   Diastolic heart failure (HCC) 02/07/2012   IDA (iron  deficiency anemia) 11/06/2011   Nausea & vomiting 07/06/2011   Fatigue 05/17/2011   Arteriosclerotic cardiovascular disease (ASCVD)    TOBACCO ABUSE 09/27/2009   Back pain 12/13/2008   Hyperlipidemia 07/22/2007   Obesity 07/22/2007   Resistant hypertension 07/22/2007   Diabetes mellitus type 2, controlled (HCC) 09/28/2002    Past Surgical History:  Procedure Laterality Date   COLONOSCOPY  01/2006   internal hemorrhoids   COLONOSCOPY  01/10/2012   Dr. Myna  anal canal hemorrhoidal tag likely source of  trivial hematochezia; right-sided colonic diverticulosis   DILATION AND CURETTAGE, DIAGNOSTIC / THERAPEUTIC  1992   ESOPHAGEAL DILATION N/A 08/18/2015   Procedure: ESOPHAGEAL DILATION;  Surgeon: Lamar CHRISTELLA Hollingshead, MD;  [facility];  Service: Endoscopy;  Laterality: N/A;   ESOPHAGOGASTRODUODENOSCOPY  09/16/08   Dr. Leeroy hiatal hernia/excoriations involving the cardia and mucosa consistent with trauma, antral erosions  of linear petechiae ?  gastritis versus early gastric antral vascular  ectasia.SABRA biopsy showed reactive gastropathy. No H. pylori.   ESOPHAGOGASTRODUODENOSCOPY  09/2007   Dr. Dudley ring, dilated to 56 French Maloney dilator, small hiatal hernia, antral erosions, biopsies reactive gastropathy.   ESOPHAGOGASTRODUODENOSCOPY (EGD) WITH PROPOFOL  N/A 12/17/2013   MFM:Umpcpjo antral erosions and petechiae. Small hiatal hernia. No endoscopic explanation for patient's symptoms   ESOPHAGOGASTRODUODENOSCOPY (EGD) WITH PROPOFOL  N/A 08/18/2015   Dr. Hollingshead: normal exam, s/p esophageal dilation   ESOPHAGOGASTRODUODENOSCOPY (EGD) WITH PROPOFOL  N/A 06/18/2019   Procedure: ESOPHAGOGASTRODUODENOSCOPY (EGD) WITH PROPOFOL ;  Surgeon: Hollingshead Lamar CHRISTELLA, MD;  Location: [facility];  Service: Endoscopy;  Laterality: N/A;  2:30pm - office moved to 10:15   SAVORY DILATION  07/17/2011   [facility] sedation-->distal esophageal stricture s/p dilation, chronic gastritis, multiple ulcers in stomach. no h.pylori    OB History     Gravida  3   Para  2   Term  2   Preterm      AB  1   Living  2      SAB      IAB  1   Ectopic      Multiple      Live Births  2            Home Medications    Prior to Admission medications   Medication Sig Start Date End Date Taking? Authorizing Provider  Accu-Chek Softclix Lancets lancets Use three times daily in skin to check blood sugars 09/23/20   Susen Pastor, MD  acetaminophen  (TYLENOL ) 500 MG tablet Take 1,000 mg by mouth every 6 (six) hours as needed for mild pain.    [provider]  ammonium lactate  (AMLACTIN) 12 % cream Apply 1 Application topically as needed for dry skin. 02/15/22   McDonald, Juliene SAUNDERS, DPM  benztropine  (COGENTIN ) 1 MG tablet Take 1 mg by mouth at bedtime. 04/30/21   [provider]  cetirizine  (ZYRTEC ) 10 MG tablet Take 10 mg by mouth daily. 07/01/21   [provider]  Cholecalciferol 125 MCG (5000 UT) capsule Take by mouth.     [provider]  Continuous Blood Gluc Receiver (FREESTYLE LIBRE 2 READER) DEVI 1 Device by Does not apply route 3 (three) times daily. 04/05/20   Agyei, Obed K, MD  Continuous Blood Gluc Sensor (FREESTYLE LIBRE 2 SENSOR) MISC Apply sensor device to outer upper arm. 04/05/20   Agyei, Obed K, MD  divalproex  (DEPAKOTE ) 250 MG DR tablet Take 3 tablets (750 mg total) by mouth at bedtime. 05/09/20   Arfeen, Leni DASEN, MD  esomeprazole  (NEXIUM ) 40 MG capsule TAKE 1 CAPSULE BY MOUTH TWICE DAILY BEFORE A MEAL FOR ACID REFLUX Patient taking differently: Take 40 mg by mouth 2 (two) times daily before a meal. TAKE 1 CAPSULE BY MOUTH TWICE DAILY BEFORE A MEAL FOR ACID REFLUX 02/18/20   [name], Vasilios, MD  ferrous sulfate  325 (65 FE) MG tablet Take 1 tablet (325 mg total) by mouth every other day. (May buy from over the counter): For anemia  03/04/20   [name], Vasilios, MD  fluticasone  (FLONASE ) 50 MCG/ACT nasal spray fluticasone  propionate 50 mcg/actuation nasal spray,suspension    [provider]  glucose blood (ACCU-CHEK GUIDE) test strip USE THREE TIMES DAILY 03/04/20   [name], Vasilios, MD  ibuprofen  (ADVIL ) 800 MG tablet Take 1 tablet (800 mg total) by mouth 3 (three) times daily. 05/29/20   Tonette Lauraine HERO, PA-C  insulin  glargine (LANTUS  SOLOSTAR) 100 UNIT/ML Solostar Pen Inject 7 Units into the skin daily at 10 pm. 06/15/20   Leontine Elbe, DO  Insulin  Pen Needle (PEN NEEDLES) 32G X 4 MM MISC 1 application by Does not apply route at bedtime. 06/15/20   Nguyen, Quan, DO  metFORMIN  (GLUCOPHAGE ) 1000 MG tablet Take 1 tablet (1,000 mg total) by mouth 2 (two) times daily with a meal. 09/21/20 09/21/21  Arnett Saunders, MD  metoprolol  succinate (TOPROL -XL) 100 MG 24 hr tablet Take 100 mg by mouth daily. 05/17/21   [provider]  olmesartan  (BENICAR ) 20 MG tablet Take 1 tablet (20 mg total) by mouth daily. 07/08/20 09/06/20  Nguyen, Quan, DO  paliperidone  (INVEGA ) 6 MG 24 hr tablet  Take 1 tablet (6 mg total) by mouth at bedtime. 05/09/20   Arfeen, Leni DASEN, MD  perphenazine  (TRILAFON ) 4 MG tablet Take 4 mg by mouth 3 (three) times daily. 06/02/21   [provider]  rosuvastatin  (CRESTOR ) 20 MG tablet TAKE 1 TABLET(20 MG) BY MOUTH DAILY 08/30/21   Nguyen, Quan, DO  sertraline  (ZOLOFT ) 50 MG tablet Take 50 mg by mouth daily. 07/02/21   [provider]  Thiamine  HCl (B-1) 100 MG TABS Take 1 tablet (100 mg total) by mouth daily. 05/18/20   Golda Lynwood PARAS, MD  traZODone  (DESYREL ) 100 MG tablet Take 1 tablet (100 mg total) by mouth at bedtime as needed for sleep. 06/07/20   Arfeen, Leni DASEN, MD  valbenazine [facility]) 40 MG capsule Ingrezza 40 mg capsule  TAKE 1 CAPSULE BY MOUTH EVERY DAY    [provider]    Family History Family History  Problem Relation Age of Onset   Hypertension Mother    Stroke Father        deceased at age 52   Colon cancer Other    Heart disease Sister    Diabetes Other    High Cholesterol Other    Arthritis Other    Anesthesia problems Neg Hx    Hypotension Neg Hx    Malignant hyperthermia Neg Hx    Pseudochol deficiency Neg Hx     Social History Social History   Tobacco Use   Smoking status: Some Days    Current packs/day: 1.00    Average packs/day: 1 pack/day for 30.0 years (30.0 ttl pk-yrs)    Types: Cigarettes    Start date: 05/18/2012   Smokeless tobacco: Never   Tobacco comments:    1 PPD  Vaping Use   Vaping status: Never Used  Substance Use Topics   Alcohol use: No    Alcohol/week: 0.0 standard drinks of alcohol    Comment: hx of ETOH abuse   Drug use: No     Allergies   Cephalexin , Metronidazole, Orange, Orange oil, Other, Shrimp [shellfish allergy], Penicillins, Sulfonamide derivatives, Glipizide, Ace inhibitors, Sulfamethoxazole, Sulfamethoxazole-trimethoprim, and Trimethoprim   Review of Systems Review of Systems Per HPI  Physical Exam Triage Vital Signs ED Triage Vitals  Encounter Vitals  Group     BP 04/20/23 1522 (!) 163/88     Systolic BP Percentile --  Diastolic BP Percentile --      Pulse Rate 04/20/23 1522 92     Resp 04/20/23 1522 18     Temp 04/20/23 1522 97.9 F (36.6 C)     Temp Source 04/20/23 1522 Oral     SpO2 04/20/23 1522 93 %     Weight --      Height --      Head Circumference --      Peak Flow --      Pain Score 04/20/23 1523 8     Pain Loc --      Pain Education --      Exclude from Growth Chart --    No data found.  Updated Vital Signs BP (!) 163/88 (BP Location: Right Arm)   Pulse 92   Temp 97.9 F (36.6 C) (Oral)   Resp 18   LMP  (LMP Unknown)   SpO2 93%   Visual Acuity Right Eye Distance:   Left Eye Distance:   Bilateral Distance:    Right Eye Near:   Left Eye Near:    Bilateral Near:     Physical Exam Vitals and nursing note reviewed.  Constitutional:      General: She is not in acute distress.    Appearance: Normal appearance.  HENT:     Mouth/Throat:     Lips: Pink.     Mouth: Mucous membranes are moist.     Dentition: Abnormal dentition. Dental tenderness and dental caries present. No dental abscesses.     Comments: Numerous dental caries present including the lower front teeth.  Teeth appear fractured in the front lower portion, including teeth numbers 26 through 23.  There is no gingival swelling or dental abscess present. Eyes:     Extraocular Movements: Extraocular movements intact.     Pupils: Pupils are equal, round, and reactive to light.  Skin:    General: Skin is warm and dry.  Neurological:     General: No focal deficit present.     Mental Status: She is alert and oriented to person, place, and time.  Psychiatric:        Mood and Affect: Mood normal.        Behavior: Behavior normal.      UC Treatments / Results  Labs (all labs ordered are listed, but only abnormal results are displayed) Labs Reviewed - No data to display  EKG   Radiology No results found.  Procedures Procedures  (including critical care time)  Medications Ordered in UC Medications - No data to display  Initial Impression / Assessment and Plan / UC Course  I have reviewed the triage vital signs and the nursing notes.  Pertinent labs & imaging results that were available during my care of the patient were reviewed by me and considered in my medical decision making (see chart for details).  Patient with abnormal dentition with dental caries present.  Treat with clindamycin  300 mg twice daily for the next 7 days.  Patient advised to continue over-the-counter Tylenol  for pain or discomfort.  Supportive care recommendations were provided and discussed with the patient to include warm salt water  gargles, and warm compresses.  Dental resource guide provided for patient to establish dental care.  Recommend following up within the next 7 to 10 days for reevaluation.  Patient was in agreement with this plan of care and verbalized understanding.  All questions were answered.  Patient stable for discharge.  Final Clinical Impressions(s) / UC Diagnoses  Final diagnoses:  None   Discharge Instructions   None    ED Prescriptions   None    PDMP not reviewed this encounter.   Gilmer Etta PARAS, NP 04/21/23 [id number]

## 2023-04-20 NOTE — Discharge Instructions (Signed)
 Take medication as prescribed. May take over-the-counter Tylenol  Arthritis Strength for pain. Warm salt water  gargles 3-4 times daily until symptoms improve. Warm compresses to the affected area to help with pain and discomfort. I have provided a dental resource guide for you to follow-up with a dentist.  Recommend following up within the next 7 to 10 days.

## 2023-05-03 DIAGNOSIS — I10 Essential (primary) hypertension: Secondary | ICD-10-CM | POA: Diagnosis not present

## 2023-05-03 DIAGNOSIS — E785 Hyperlipidemia, unspecified: Secondary | ICD-10-CM | POA: Diagnosis not present

## 2023-05-03 DIAGNOSIS — E119 Type 2 diabetes mellitus without complications: Secondary | ICD-10-CM | POA: Diagnosis not present

## 2023-05-09 DIAGNOSIS — E1165 Type 2 diabetes mellitus with hyperglycemia: Secondary | ICD-10-CM | POA: Diagnosis not present

## 2023-05-09 DIAGNOSIS — R300 Dysuria: Secondary | ICD-10-CM | POA: Diagnosis not present

## 2023-05-09 DIAGNOSIS — I129 Hypertensive chronic kidney disease with stage 1 through stage 4 chronic kidney disease, or unspecified chronic kidney disease: Secondary | ICD-10-CM | POA: Diagnosis not present

## 2023-05-09 DIAGNOSIS — D509 Iron deficiency anemia, unspecified: Secondary | ICD-10-CM | POA: Diagnosis not present

## 2023-05-09 DIAGNOSIS — N189 Chronic kidney disease, unspecified: Secondary | ICD-10-CM | POA: Diagnosis not present

## 2023-05-09 DIAGNOSIS — I10 Essential (primary) hypertension: Secondary | ICD-10-CM | POA: Diagnosis not present

## 2023-05-09 DIAGNOSIS — K047 Periapical abscess without sinus: Secondary | ICD-10-CM | POA: Diagnosis not present

## 2023-05-09 DIAGNOSIS — R5383 Other fatigue: Secondary | ICD-10-CM | POA: Diagnosis not present

## 2023-05-09 DIAGNOSIS — N8110 Cystocele, unspecified: Secondary | ICD-10-CM | POA: Diagnosis not present

## 2023-05-09 DIAGNOSIS — F17200 Nicotine dependence, unspecified, uncomplicated: Secondary | ICD-10-CM | POA: Diagnosis not present

## 2023-05-09 DIAGNOSIS — I272 Pulmonary hypertension, unspecified: Secondary | ICD-10-CM | POA: Diagnosis not present

## 2023-05-09 DIAGNOSIS — E785 Hyperlipidemia, unspecified: Secondary | ICD-10-CM | POA: Diagnosis not present

## 2023-05-09 DIAGNOSIS — K219 Gastro-esophageal reflux disease without esophagitis: Secondary | ICD-10-CM | POA: Diagnosis not present

## 2023-06-21 DIAGNOSIS — Z23 Encounter for immunization: Secondary | ICD-10-CM | POA: Diagnosis not present

## 2023-08-13 DIAGNOSIS — Z79899 Other long term (current) drug therapy: Secondary | ICD-10-CM | POA: Diagnosis not present

## 2023-08-13 DIAGNOSIS — I10 Essential (primary) hypertension: Secondary | ICD-10-CM | POA: Diagnosis not present

## 2023-08-13 DIAGNOSIS — F17200 Nicotine dependence, unspecified, uncomplicated: Secondary | ICD-10-CM | POA: Diagnosis not present

## 2023-10-31 DIAGNOSIS — J302 Other seasonal allergic rhinitis: Secondary | ICD-10-CM | POA: Diagnosis not present

## 2023-10-31 DIAGNOSIS — K047 Periapical abscess without sinus: Secondary | ICD-10-CM | POA: Diagnosis not present

## 2023-10-31 DIAGNOSIS — K219 Gastro-esophageal reflux disease without esophagitis: Secondary | ICD-10-CM | POA: Diagnosis not present

## 2023-11-05 DIAGNOSIS — E1165 Type 2 diabetes mellitus with hyperglycemia: Secondary | ICD-10-CM | POA: Diagnosis not present

## 2023-11-05 DIAGNOSIS — I10 Essential (primary) hypertension: Secondary | ICD-10-CM | POA: Diagnosis not present

## 2023-11-05 DIAGNOSIS — Z23 Encounter for immunization: Secondary | ICD-10-CM | POA: Diagnosis not present

## 2023-11-05 DIAGNOSIS — D509 Iron deficiency anemia, unspecified: Secondary | ICD-10-CM | POA: Diagnosis not present

## 2023-11-12 DIAGNOSIS — N189 Chronic kidney disease, unspecified: Secondary | ICD-10-CM | POA: Diagnosis not present

## 2023-11-12 DIAGNOSIS — D509 Iron deficiency anemia, unspecified: Secondary | ICD-10-CM | POA: Diagnosis not present

## 2023-11-12 DIAGNOSIS — N8110 Cystocele, unspecified: Secondary | ICD-10-CM | POA: Diagnosis not present

## 2023-11-12 DIAGNOSIS — E1165 Type 2 diabetes mellitus with hyperglycemia: Secondary | ICD-10-CM | POA: Diagnosis not present

## 2023-11-12 DIAGNOSIS — E785 Hyperlipidemia, unspecified: Secondary | ICD-10-CM | POA: Diagnosis not present

## 2023-11-12 DIAGNOSIS — I10 Essential (primary) hypertension: Secondary | ICD-10-CM | POA: Diagnosis not present

## 2023-11-12 DIAGNOSIS — M545 Low back pain, unspecified: Secondary | ICD-10-CM | POA: Diagnosis not present

## 2023-11-12 DIAGNOSIS — I272 Pulmonary hypertension, unspecified: Secondary | ICD-10-CM | POA: Diagnosis not present

## 2023-11-12 DIAGNOSIS — R5383 Other fatigue: Secondary | ICD-10-CM | POA: Diagnosis not present

## 2023-11-12 DIAGNOSIS — K219 Gastro-esophageal reflux disease without esophagitis: Secondary | ICD-10-CM | POA: Diagnosis not present

## 2023-11-12 DIAGNOSIS — I1310 Hypertensive heart and chronic kidney disease without heart failure, with stage 1 through stage 4 chronic kidney disease, or unspecified chronic kidney disease: Secondary | ICD-10-CM | POA: Diagnosis not present

## 2023-11-12 DIAGNOSIS — R300 Dysuria: Secondary | ICD-10-CM | POA: Diagnosis not present

## 2023-11-21 ENCOUNTER — Ambulatory Visit: Admitting: Podiatry

## 2023-11-25 ENCOUNTER — Ambulatory Visit: Admitting: Podiatry

## 2024-03-19 ENCOUNTER — Encounter: Payer: Self-pay | Admitting: Adult Health

## 2024-03-19 ENCOUNTER — Other Ambulatory Visit (HOSPITAL_COMMUNITY)
Admission: RE | Admit: 2024-03-19 | Discharge: 2024-03-19 | Disposition: A | Discharge status: Home / Self Care | Source: Ambulatory Visit | Attending: Adult Health | Admitting: Adult Health

## 2024-03-19 ENCOUNTER — Ambulatory Visit (HOSPITAL_COMMUNITY)
Admission: RE | Admit: 2024-03-19 | Discharge: 2024-03-19 | Disposition: A | Source: Ambulatory Visit | Attending: Adult Health | Admitting: Adult Health

## 2024-03-19 ENCOUNTER — Encounter (HOSPITAL_COMMUNITY): Payer: Self-pay

## 2024-03-19 ENCOUNTER — Ambulatory Visit: Admitting: Adult Health

## 2024-03-19 VITALS — BP 125/83 | HR 71 | Ht 64.5 in | Wt 182.0 lb

## 2024-03-19 DIAGNOSIS — K089 Disorder of teeth and supporting structures, unspecified: Secondary | ICD-10-CM | POA: Insufficient documentation

## 2024-03-19 DIAGNOSIS — Z1231 Encounter for screening mammogram for malignant neoplasm of breast: Secondary | ICD-10-CM | POA: Insufficient documentation

## 2024-03-19 DIAGNOSIS — Z1211 Encounter for screening for malignant neoplasm of colon: Secondary | ICD-10-CM | POA: Insufficient documentation

## 2024-03-19 DIAGNOSIS — Z1331 Encounter for screening for depression: Secondary | ICD-10-CM | POA: Diagnosis not present

## 2024-03-19 DIAGNOSIS — Z1151 Encounter for screening for human papillomavirus (HPV): Secondary | ICD-10-CM

## 2024-03-19 DIAGNOSIS — Z01419 Encounter for gynecological examination (general) (routine) without abnormal findings: Secondary | ICD-10-CM | POA: Diagnosis not present

## 2024-03-19 DIAGNOSIS — Z1212 Encounter for screening for malignant neoplasm of rectum: Secondary | ICD-10-CM | POA: Diagnosis not present

## 2024-03-19 DIAGNOSIS — I10 Essential (primary) hypertension: Secondary | ICD-10-CM | POA: Diagnosis not present

## 2024-03-19 NOTE — Progress Notes (Signed)
 Patient ID: [name], female   DOB: 1966-01-02, 58 y.o.   MRN: [id number] History of Present Illness: [name] is a 58 year old black female,single, PM in for a well woman gyn exam and pap. She has soreness in pelvic area times.  PCP is JONETTA Batty NP   Current Medications, Allergies, Past Medical History, Past Surgical History, Family History and Social History were reviewed in [employer] record.     Review of Systems: Patient denies any  hearing loss, fatigue, blurred vision, shortness of breath, chest pain, abdominal pain, problems with bowel movements, urination, or intercourse(not active). No joint pain or mood swings. Has soreness on pelvic area at times Has occasional headache Has bad teeth she says  Denies any vaginal bleeding   Physical Exam:BP 125/83 (BP Location: Left Arm, Patient Position: Sitting, Cuff Size: Normal)   Pulse 71   Ht 5' 4.5 (1.638 m)   Wt 182 lb (82.6 kg)   LMP  (LMP Unknown)   BMI 30.76 kg/m   General:  Well developed, well nourished, no acute distress Skin:  Warm and dry, has missing teeth Neck:  Midline trachea, normal thyroid , good ROM, no lymphadenopathy Lungs; Clear to auscultation bilaterally Breast:  No dominant palpable mass, retraction, or nipple discharge Cardiovascular: Regular rate and rhythm Abdomen:  Soft, non tender, no hepatosplenomegaly Pelvic:  External genitalia is normal in appearance, no lesions.  The vagina is normal in appearance. Urethra has no lesions or masses. The cervix is smooth, pap with GC/CHL and HR HPV genotyping performed.  Uterus is felt to be normal size, shape, and contour.  No adnexal masses or tenderness noted.Bladder is non tender, no masses felt. Rectal:deferred  Extremities/musculoskeletal:  No swelling or varicosities noted, no clubbing or cyanosis Psych:  No mood changes, alert and cooperative,seems happy AA is 0 Fall risk is low    03/19/2024   10:33 AM 07/06/2021    9:24 AM  09/20/2020   10:05 AM  Depression screen PHQ 2/9  Decreased Interest 2 0 0  Down, Depressed, Hopeless 2 0 0  PHQ - 2 Score 4 0 0  Altered sleeping 0 0 0  Tired, decreased energy 1 0 0  Change in appetite 3 2 0  Feeling bad or failure about yourself  2 0 0  Trouble concentrating 0 0 0  Moving slowly or fidgety/restless 0 0 0  Suicidal thoughts 0 0 0  PHQ-9 Score 10 2  0   Difficult doing work/chores   Not difficult at all     Data saved with a previous flowsheet row definition   On meds    03/19/2024   10:33 AM 07/06/2021    9:24 AM  GAD 7 : Generalized Anxiety Score  Nervous, Anxious, on Edge 0 0  Control/stop worrying 0 0  Worry too much - different things 2 0  Trouble relaxing 0 0  Restless 0 0  Easily annoyed or irritable 2 0  Afraid - awful might happen 0 0  Total GAD 7 Score 4 0    Upstream - 03/19/24 1031       Pregnancy Intention Screening   Does the patient want to become pregnant in the next year? N/A    Does the patient's partner want to become pregnant in the next year? N/A    Would the patient like to discuss contraceptive options today? N/A      Contraception Wrap Up   Current Method Abstinence;Post-Menopause    End  Method Abstinence;Post-Menopause    Contraception Counseling Provided No           Examination chaperoned by Clarita Salt LPN   Impression and plan: 1. Encounter for gynecological examination with Papanicolaou smear of cervix (Primary) Pap sent Pap in 3 years if negative Physical in 1 year Labs with PCP  - Cytology - PAP( [place])  2. Hypertension, unspecified type Continue BP meds and follow up with PCP  3. Screening mammogram for breast cancer Mammogram scheduled for her today 12 N at [facility]  - MM 3D SCREENING MAMMOGRAM BILATERAL BREAST; Future  4. Screening for colorectal cancer Refer to [facility] for [facility]oscopy  - Ambulatory referral to Gastroenterology  5. Poor dentition To call Dr Dorthea Ash DDS

## 2024-03-23 ENCOUNTER — Ambulatory Visit: Payer: Self-pay | Admitting: Adult Health

## 2024-03-23 LAB — CYTOLOGY - PAP
Chlamydia: NEGATIVE
Comment: NEGATIVE
Comment: NEGATIVE
Comment: NORMAL
Diagnosis: NEGATIVE
High risk HPV: NEGATIVE
Neisseria Gonorrhea: NEGATIVE

## 2024-03-24 ENCOUNTER — Encounter (INDEPENDENT_AMBULATORY_CARE_PROVIDER_SITE_OTHER): Payer: Self-pay | Admitting: *Deleted

## 2024-03-24 NOTE — Telephone Encounter (Signed)
 Pt aware pap was negative for HPV, malignancy and GC/CHL. Next pap due in 3 years. Pt voiced understanding. JSY

## 2024-03-24 NOTE — Telephone Encounter (Signed)
-----   Message from [place] sent at 03/23/2024  2:16 PM EST ----- Let her know about pap. thx

## 2024-03-30 ENCOUNTER — Other Ambulatory Visit (HOSPITAL_COMMUNITY): Payer: Self-pay | Admitting: Adult Health

## 2024-03-30 DIAGNOSIS — R928 Other abnormal and inconclusive findings on diagnostic imaging of breast: Secondary | ICD-10-CM

## 2024-03-30 NOTE — Telephone Encounter (Signed)
 Pt aware mammogram showed possible asymmetry in left breast. Needs more imaging. Xray will call pt or pt can call and schedule. Pt voiced understanding. JSY

## 2024-03-30 NOTE — Telephone Encounter (Signed)
-----   Message from Rachel Lewis, NP sent at 03/30/2024  1:43 PM EST ----- Call pt and let her know about mammogram. [facility]

## 2024-04-07 ENCOUNTER — Inpatient Hospital Stay (HOSPITAL_COMMUNITY): Admission: RE | Admit: 2024-04-07 | Source: Ambulatory Visit

## 2024-04-07 ENCOUNTER — Ambulatory Visit (HOSPITAL_COMMUNITY)

## 2024-05-12 ENCOUNTER — Inpatient Hospital Stay (HOSPITAL_COMMUNITY): Admission: RE | Admit: 2024-05-12 | Source: Ambulatory Visit

## 2024-05-12 ENCOUNTER — Ambulatory Visit (HOSPITAL_COMMUNITY)

## 2024-05-28 ENCOUNTER — Ambulatory Visit (HOSPITAL_COMMUNITY)

## 2024-05-28 ENCOUNTER — Encounter (HOSPITAL_COMMUNITY)
